# Patient Record
Sex: Female | Born: 1955
Health system: Southern US, Community
[De-identification: ages and names within clinical notes are randomized; demographics above are authoritative.]

## PROBLEM LIST (undated history)

## (undated) DIAGNOSIS — F419 Anxiety disorder, unspecified: Secondary | ICD-10-CM

## (undated) DIAGNOSIS — D509 Iron deficiency anemia, unspecified: Secondary | ICD-10-CM

## (undated) DIAGNOSIS — T7840XA Allergy, unspecified, initial encounter: Secondary | ICD-10-CM

## (undated) DIAGNOSIS — M069 Rheumatoid arthritis, unspecified: Secondary | ICD-10-CM

## (undated) DIAGNOSIS — J449 Chronic obstructive pulmonary disease, unspecified: Secondary | ICD-10-CM

## (undated) DIAGNOSIS — I209 Angina pectoris, unspecified: Secondary | ICD-10-CM

## (undated) DIAGNOSIS — Z9989 Dependence on other enabling machines and devices: Secondary | ICD-10-CM

## (undated) DIAGNOSIS — I219 Acute myocardial infarction, unspecified: Secondary | ICD-10-CM

## (undated) DIAGNOSIS — Z8719 Personal history of other diseases of the digestive system: Secondary | ICD-10-CM

## (undated) DIAGNOSIS — E119 Type 2 diabetes mellitus without complications: Secondary | ICD-10-CM

## (undated) DIAGNOSIS — F32A Depression, unspecified: Secondary | ICD-10-CM

## (undated) DIAGNOSIS — I4719 Other supraventricular tachycardia: Secondary | ICD-10-CM

## (undated) DIAGNOSIS — I5189 Other ill-defined heart diseases: Secondary | ICD-10-CM

## (undated) DIAGNOSIS — R011 Cardiac murmur, unspecified: Secondary | ICD-10-CM

## (undated) DIAGNOSIS — R519 Headache, unspecified: Secondary | ICD-10-CM

## (undated) DIAGNOSIS — I639 Cerebral infarction, unspecified: Secondary | ICD-10-CM

## (undated) DIAGNOSIS — E785 Hyperlipidemia, unspecified: Secondary | ICD-10-CM

## (undated) DIAGNOSIS — Z8711 Personal history of peptic ulcer disease: Secondary | ICD-10-CM

## (undated) DIAGNOSIS — I491 Atrial premature depolarization: Secondary | ICD-10-CM

## (undated) DIAGNOSIS — M1990 Unspecified osteoarthritis, unspecified site: Secondary | ICD-10-CM

## (undated) DIAGNOSIS — D689 Coagulation defect, unspecified: Secondary | ICD-10-CM

## (undated) DIAGNOSIS — I2510 Atherosclerotic heart disease of native coronary artery without angina pectoris: Secondary | ICD-10-CM

## (undated) DIAGNOSIS — F329 Major depressive disorder, single episode, unspecified: Secondary | ICD-10-CM

## (undated) DIAGNOSIS — H5789 Other specified disorders of eye and adnexa: Secondary | ICD-10-CM

## (undated) DIAGNOSIS — R0902 Hypoxemia: Secondary | ICD-10-CM

## (undated) DIAGNOSIS — I471 Supraventricular tachycardia: Secondary | ICD-10-CM

## (undated) DIAGNOSIS — R55 Syncope and collapse: Secondary | ICD-10-CM

## (undated) DIAGNOSIS — D569 Thalassemia, unspecified: Secondary | ICD-10-CM

## (undated) DIAGNOSIS — I509 Heart failure, unspecified: Secondary | ICD-10-CM

## (undated) DIAGNOSIS — K219 Gastro-esophageal reflux disease without esophagitis: Secondary | ICD-10-CM

## (undated) DIAGNOSIS — I201 Angina pectoris with documented spasm: Secondary | ICD-10-CM

## (undated) DIAGNOSIS — R51 Headache: Secondary | ICD-10-CM

## (undated) DIAGNOSIS — G4733 Obstructive sleep apnea (adult) (pediatric): Secondary | ICD-10-CM

## (undated) DIAGNOSIS — K259 Gastric ulcer, unspecified as acute or chronic, without hemorrhage or perforation: Secondary | ICD-10-CM

## (undated) DIAGNOSIS — I10 Essential (primary) hypertension: Secondary | ICD-10-CM

## (undated) DIAGNOSIS — G4730 Sleep apnea, unspecified: Secondary | ICD-10-CM

## (undated) DIAGNOSIS — J189 Pneumonia, unspecified organism: Secondary | ICD-10-CM

## (undated) DIAGNOSIS — Z9289 Personal history of other medical treatment: Secondary | ICD-10-CM

## (undated) DIAGNOSIS — I050 Rheumatic mitral stenosis: Secondary | ICD-10-CM

## (undated) DIAGNOSIS — I493 Ventricular premature depolarization: Secondary | ICD-10-CM

## (undated) DIAGNOSIS — D571 Sickle-cell disease without crisis: Secondary | ICD-10-CM

## (undated) HISTORY — DX: Chronic obstructive pulmonary disease, unspecified: J44.9

## (undated) HISTORY — DX: Thalassemia, unspecified: D56.9

## (undated) HISTORY — DX: Hyperlipidemia, unspecified: E78.5

## (undated) HISTORY — DX: Other ill-defined heart diseases: I51.89

## (undated) HISTORY — PX: BREAST BIOPSY: SHX20

## (undated) HISTORY — PX: VAGINAL HYSTERECTOMY: SUR661

## (undated) HISTORY — DX: Sickle-cell disease without crisis: D57.1

## (undated) HISTORY — DX: Coagulation defect, unspecified: D68.9

## (undated) HISTORY — PX: TONSILLECTOMY: SUR1361

## (undated) HISTORY — DX: Cerebral infarction, unspecified: I63.9

## (undated) HISTORY — DX: Gastro-esophageal reflux disease without esophagitis: K21.9

## (undated) HISTORY — PX: COLONOSCOPY: SHX174

## (undated) HISTORY — DX: Atherosclerotic heart disease of native coronary artery without angina pectoris: I25.10

## (undated) HISTORY — PX: DILATION AND CURETTAGE OF UTERUS: SHX78

## (undated) HISTORY — PX: JOINT REPLACEMENT: SHX530

## (undated) HISTORY — DX: Gastric ulcer, unspecified as acute or chronic, without hemorrhage or perforation: K25.9

## (undated) HISTORY — DX: Unspecified osteoarthritis, unspecified site: M19.90

## (undated) HISTORY — DX: Heart failure, unspecified: I50.9

## (undated) HISTORY — PX: ECTOPIC PREGNANCY SURGERY: SHX613

## (undated) HISTORY — DX: Acute myocardial infarction, unspecified: I21.9

## (undated) HISTORY — DX: Iron deficiency anemia, unspecified: D50.9

## (undated) HISTORY — DX: Allergy, unspecified, initial encounter: T78.40XA

## (undated) HISTORY — DX: Hypoxemia: R09.02

## (undated) SURGERY — ESOPHAGOGASTRODUODENOSCOPY (EGD) WITH PROPOFOL
Anesthesia: Monitor Anesthesia Care | Laterality: Left

---

## 2006-06-03 ENCOUNTER — Encounter: Admission: RE | Admit: 2006-06-03 | Discharge: 2006-06-03 | Payer: Self-pay | Admitting: Occupational Medicine

## 2007-08-28 ENCOUNTER — Ambulatory Visit (HOSPITAL_COMMUNITY): Admission: RE | Admit: 2007-08-28 | Discharge: 2007-08-28 | Payer: Self-pay | Admitting: Internal Medicine

## 2008-08-18 ENCOUNTER — Observation Stay (HOSPITAL_COMMUNITY): Admission: EM | Admit: 2008-08-18 | Discharge: 2008-08-19 | Payer: Self-pay | Admitting: Emergency Medicine

## 2010-11-05 ENCOUNTER — Encounter: Payer: Self-pay | Admitting: Gastroenterology

## 2010-12-21 ENCOUNTER — Other Ambulatory Visit: Payer: Self-pay | Admitting: Family Medicine

## 2010-12-21 DIAGNOSIS — Z1231 Encounter for screening mammogram for malignant neoplasm of breast: Secondary | ICD-10-CM

## 2010-12-29 ENCOUNTER — Ambulatory Visit
Admission: RE | Admit: 2010-12-29 | Discharge: 2010-12-29 | Disposition: A | Discharge status: Home / Self Care | Payer: BC Managed Care – PPO | Source: Ambulatory Visit | Attending: Family Medicine | Admitting: Family Medicine

## 2010-12-29 DIAGNOSIS — Z1231 Encounter for screening mammogram for malignant neoplasm of breast: Secondary | ICD-10-CM

## 2011-02-27 NOTE — Cardiovascular Report (Signed)
[name], [name]:  [id number]   MEDICAL RECORD NO.:  [id number]          PATIENT TYPE:  INP   LOCATION:  2036                         FACILITY:  [facility]   PHYSICIAN:  Madaline Savage, M.D.DATE OF BIRTH:  Mar 25, 1956   DATE OF PROCEDURE:  08/18/2008  DATE OF DISCHARGE:                            CARDIAC CATHETERIZATION   PROCEDURES PERFORMED:  1. Selective coronary angiography by Judkins technique.  2. Retrograde left heart catheterization.  3. Left ventricular angiography.   COMPLICATIONS:  None.   ENTRY SITE:  Right femoral.   DYE USED:  Omnipaque.   PATIENT PROFILE:  The patient is a 55 year old married African American  female who has presented to the emergency room with chest pain of 2-3  duration.  The patient's risk factors include obesity, tobacco use for  many years, hypertension, esophageal reflux disorder, and tobacco use.  She had a recent stress test showing a suspicious area of inferior wall  reversible perfusion abnormalities, and because of her presentation  today to the emergency room, we decided to bring her to the cath lab for  cardiac catheterization.  That procedure was performed electively by the  right percutaneous femoral approach using 5-French catheters without  complications.  No intervention was required.  The patient will be  watched overnight because of the lateness of today's catheterization,  and she will be a candidate for discharge tomorrow morning.   RESULTS:  Pressures:  The left ventricular pressure showed no aortic  valve gradient.  The pressures recorded in the catheterization report  are nonsimultaneous pressures.  There was no gradient across the aortic  valve by true catheter pullback.  LV pressure was recorded at 140/16.  Central aortic pressure was recorded at 130/76 with a mean of 96.   ANGIOGRAPHIC RESULTS:  The patient had very, very fine faint  calcifications in the proximal and mid LAD, no  pericardial valvular or  other calcifications were seen.   The left main coronary artery was large in diameter, fairly short in  length, and was normal.  LAD coursed to the cardiac apex giving rise to  3 septal perforator branches, 2 diagonal branches, and no lesions were  noted in the entirety of the LAD coronary system.   The circumflex was a large vessel, though nondominant.  It gave rise to  1 large obtuse marginal branch that had tortuous runoff and bifurcated  distally.  The remainder of the circumflex itself, which had 4 small  branches showed no evidence of any stenoses.   Right coronary artery showed a normal right coronary artery with a  posterior descending branch of moderate size bifurcating distally and 1  area of luminal irregularity in the midportion of the RCA that was no  more than 20% narrow.  It was smooth and not a high-grade or flow-  limiting lesion.   Left ventricular angiogram showed vigorous contractility of all wall  segments.  No normal left ventricular ejection fraction of 60%.  No  mitral regurgitation.   FINAL IMPRESSIONS:  1. Recent abnormal stress test followed by several-day history of      chest  pain presenting to the ER.  2. Cardiac catheterization showing following results, angiographically      patent coronary arteries with a right coronary dominant system.  3. Normal left ventricular systolic function.  4. No wall motion abnormalities of the left ventricle.   PLAN:  The patient will be a candidate for discharge tomorrow morning.  Due to the lateness of the hour at this evening, she will not be  discharged tonight.  I am concerned about hemostasis, her obesity, and  the lateness of the hour.   FINAL IMPRESSIONS:  Angiographically patent coronary arteries with  normal left ventricular systolic function.           ______________________________  Madaline Savage, M.D.     WHG/MEDQ  D:  08/18/2008  T:  08/19/2008  Job:  [id number]   cc:    [facility]

## 2011-02-27 NOTE — H&P (Signed)
NAME:  Lynn [name], Lynn [name]:  [id number]   MEDICAL RECORD NO.:  [id number]          PATIENT TYPE:  EMS   LOCATION:  MAJO                         FACILITY:  [facility]   PHYSICIAN:  Lynn [name], M.D.DATE OF BIRTH:  May 01, 1956   DATE OF ADMISSION:  08/18/2008  DATE OF DISCHARGE:                              HISTORY & PHYSICAL   CHIEF COMPLAINT:  Chest pain and shortness of breath.   HISTORY OF PRESENT ILLNESS:  Lynn [name] is a pleasant 55 year old  female who was seen by Lynn [name] June 03, 2008 in our office.  Please refer to his complete note for details.  She had been referred to  him from [facility] for chest pain.  She had actually been  having symptoms off and on for a couple months.  She does have risk  factors for coronary disease.  Lynn [name] had set her up for an  outpatient Myoview which was done August 10, 2008.  This revealed an EF  of 78%.  There were no significant wall motion abnormalities.  There was  a suggestion of mild ischemia in the LAD distribution.  She was  scheduled to follow up with Lynn [name] August 24, 2008.  In the  interim she has continued to have intermittent chest pain off and on.  She said last night at rest she had midsternal chest pain which radiated  to her left axilla area.  This was associated with some shortness of  breath and she said she broke out into a sweat and got nauseated.  She  took some aspirin at home and her symptoms eventually subsided, although  they did not abate completely.  This morning she felt better and was  doing well until she got excited after she opened up something in the  mail and then had chest pain, shortness of breath and sweating again.  She is now seen in the emergency room.  She presented initially to  [facility]. was given nitroglycerin there and she thinks she has  had some relief from this, but she still has some vague discomfort.  Her  EKG in the emergency  room shows sinus rhythm without acute changes.  She  is admitted now for further evaluation.   PAST MEDICAL HISTORY:  Remarkable for treated hypertension.  She had a  hysterectomy in the past and remote tonsillectomy and right knee  arthroscopic surgery in the past.   Her medications at home are:  1. Diovan 80 mg daily.  2. Aspirin 81 mg daily.   ALLERGIES:  Include PENICILLIN WHICH CAUSED A RASH AND ACE INHIBITORS  WHICH CAUSE A COUGH.  SHE GETS DISCOMFORT IN HER LEGS FROM  HYDROCHLOROTHIAZIDE.   SOCIAL HISTORY:  The patient has one child and two grandchildren.  She  works as a Midwife.  She has a 40 pack-year history.   FAMILY HISTORY:  Is remarkable for hypertension.   REVIEW OF SYSTEMS:  Essentially unremarkable except for noted above.  She denies any GI bleeding or melena, recent fever or chills.  She has  not had thyroid disease that  she knows of.   PHYSICAL EXAM:  Blood pressure 156/96, pulse 96, temperature 97, O2 sat  is 100% on 2 liters.  IN GENERAL:  She is a well-developed overweight African American female  in no acute distress.  HEENT:  Normocephalic.  Extraocular movements are intact.  Sclerae is  nonicteric.  Lids and conjunctivae are within normal limits.  NECK:  Without JVD or bruit.  Thyroid is not enlarged.  CHEST:  Clear to auscultation and percussion.  CARDIAC:  Exam reveals regular rate and rhythm without murmur, rub or  gallop.  Normal S1, S2.  ABDOMEN:  Is obese, nontender.  EXTREMITIES: Without edema.  Distal pulses are 3+/4 bilaterally.  NEURO:  Exam grossly intact.  She is awake, alert and oriented,  cooperative.  Moves all extremities without obvious deficit.  SKIN:  Is warm and dry.   EKG shows sinus rhythm with poor anterior R-wave progression but no  acute changes.   IMPRESSION:  1. Unstable angina.  2. Abnormal outpatient Myoview study.  3. Treated hypertension.  4. History of smoking.  5. Obesity.   PLAN:  The patient  will be  seen by Dr. Elsie [name] in the emergency room.  We will go ahead and start her on heparin and nitroglycerin.  She will  need diagnostic catheterization.      Lynn [name], P.A.    ______________________________  Lynn [name], M.D.    Lynn [name]  D:  08/18/2008  T:  08/18/2008  Job:  [id number]

## 2011-03-02 NOTE — Discharge Summary (Signed)
Lynn [name], HANGER NO.:  [id number]   MEDICAL RECORD NO.:  [id number]          PATIENT TYPE:  INP   LOCATION:  2036                         FACILITY:  [facility]   PHYSICIAN:  Madaline Savage, M.D.DATE OF BIRTH:  1956/06/12   DATE OF ADMISSION:  08/18/2008  DATE OF DISCHARGE:  08/19/2008                               DISCHARGE SUMMARY   DISCHARGE DIAGNOSES:  1. Chest pain, worrisome for unstable angina.  2. Hypertension.  3. History of smoking.  4. Obesity.   HOSPITAL COURSE:  The patient is a 55 year old female who was actually  seen by Dr. [name] in our office on June 11, 2008.  She had been  referred to him for chest pain from [name].  She does have  risk factors for coronary artery disease.  She did have a Myoview, which  was done on August 10, 2008, that revealed good LV function with no  significant wall motion abnormalities and a suggestion of mild ischemia  in the LAD distribution.  She was scheduled to follow up with Dr.  [name], but presented in the interim to [facility] with substernal chest  pain and shortness of breath.  See admission history and physical for  complete details.  Her symptoms were worrisome for unstable angina.  She  was admitted to the hospital and started on IV heparin and nitrates.  Diagnostic catheterization was done on August 18, 2008, by Dr. Elsie Lincoln  which revealed normal coronaries and normal LV function.  She tolerated  the procedure well.  We feel she could be discharged on August 19, 2008, she was seen by Dr. [name] at discharge.  He suggested possibly a  GI evaluation and EGD as an outpatient.  She will follow up with Dr.  [name] prior to going back to work.   DISCHARGE MEDICATIONS:  1. Diovan 80 mg a day.  2. Aspirin 81 mg a day.  3. Nexium 40 mg a day.   EKG shows sinus rhythm without acute changes.   LABORATORY DATA:  White count 8.3, hemoglobin at discharge 9.4,  hematocrit 29.4, and platelets  256.  INR 0.9.  Sodium 139, potassium  4.4, BUN 10, and creatinine 0.8.  CK was elevated at 185, but MBs and  troponins were  negative x3.  LFTs were normal.  Lipid panel reveals a cholesterol 160,  triglycerides 79, HDL 30, and LDL 114.  TSH 1.68.   DISPOSITION:  The patient is discharged in stable condition and will  follow up with Dr. [name] as an outpatient.      [name], P.A.    ______________________________  Madaline Savage, M.D.    Lenard Lance  D:  09/30/2008  T:  09/30/2008  Job:  [id number]   cc:   [facility]  Sheliah Mends, MD

## 2011-07-17 LAB — COMPREHENSIVE METABOLIC PANEL
ALT: 16
AST: 18
Albumin: 3.6
Alkaline Phosphatase: 93
BUN: 11
CO2: 29
Calcium: 9.1
Chloride: 107
Creatinine, Ser: 0.79
GFR calc Af Amer: >60
GFR calc non Af Amer: >60
Glucose, Bld: 100 — ABNORMAL HIGH
Potassium: 3.9
Sodium: 139
Total Bilirubin: 0.3
Total Protein: 7.1

## 2011-07-17 LAB — BASIC METABOLIC PANEL
BUN: 10
BUN: 12
CO2: 29
CO2: 31
Calcium: 8.9
Calcium: 9.3
Chloride: 104
Chloride: 108
Creatinine, Ser: 0.78
Creatinine, Ser: 0.86
GFR calc Af Amer: >60
GFR calc Af Amer: >60
GFR calc non Af Amer: >60
GFR calc non Af Amer: >60
Glucose, Bld: 102 — ABNORMAL HIGH
Glucose, Bld: 107 — ABNORMAL HIGH
Potassium: 4.1
Potassium: 4.4
Sodium: 139
Sodium: 140

## 2011-07-17 LAB — DIFFERENTIAL
Basophils Absolute: 0.1
Basophils Relative: 1
Eosinophils Absolute: 0.2
Eosinophils Relative: 2
Lymphocytes Relative: 26
Lymphs Abs: 2.4
Monocytes Absolute: 0.9
Monocytes Relative: 10
Neutro Abs: 5.7
Neutrophils Relative %: 61

## 2011-07-17 LAB — CBC
HCT: 29.4 — ABNORMAL LOW
HCT: 31.1 — ABNORMAL LOW
Hemoglobin: 10 — ABNORMAL LOW
Hemoglobin: 9.4 — ABNORMAL LOW
MCHC: 31.9
MCHC: 32
MCV: 60.7 — ABNORMAL LOW
MCV: 60.8 — ABNORMAL LOW
Platelets: 256
Platelets: 276
RBC: 4.84
RBC: 5.11
RDW: 16.4 — ABNORMAL HIGH
RDW: 16.6 — ABNORMAL HIGH
WBC: 8.3
WBC: 9.3

## 2011-07-17 LAB — CARDIAC PANEL(CRET KIN+CKTOT+MB+TROPI)
CK, MB: 1.9
Relative Index: 1
Total CK: 184 — ABNORMAL HIGH
Troponin I: <0.01

## 2011-07-17 LAB — LIPID PANEL
Cholesterol: 160
HDL: 30 — ABNORMAL LOW
LDL Cholesterol: 114 — ABNORMAL HIGH
Total CHOL/HDL Ratio: 5.3
Triglycerides: 79
VLDL: 16

## 2011-07-17 LAB — CK TOTAL AND CKMB (NOT AT ARMC)
CK, MB: 1.9
CK, MB: 2.3
Relative Index: 1.2
Relative Index: 1.2
Total CK: 153
Total CK: 185 — ABNORMAL HIGH

## 2011-07-17 LAB — URINALYSIS, ROUTINE W REFLEX MICROSCOPIC
Bilirubin Urine: NEGATIVE
Glucose, UA: NEGATIVE
Hgb urine dipstick: NEGATIVE
Ketones, ur: NEGATIVE
Nitrite: NEGATIVE
Protein, ur: NEGATIVE
Specific Gravity, Urine: 1.021
Urobilinogen, UA: 0.2
pH: 5.5

## 2011-07-17 LAB — APTT: aPTT: 38 — ABNORMAL HIGH

## 2011-07-17 LAB — TSH: TSH: 1.685

## 2011-07-17 LAB — POCT CARDIAC MARKERS
CKMB, poc: 1.8
Myoglobin, poc: 86.6
Troponin i, poc: <0.05

## 2011-07-17 LAB — TROPONIN I
Troponin I: 0.01
Troponin I: 0.01

## 2011-07-17 LAB — PROTIME-INR
INR: 0.9
Prothrombin Time: 12.6

## 2011-10-05 ENCOUNTER — Ambulatory Visit (INDEPENDENT_AMBULATORY_CARE_PROVIDER_SITE_OTHER): Payer: BC Managed Care – PPO | Admitting: Family Medicine

## 2011-10-05 DIAGNOSIS — IMO0001 Reserved for inherently not codable concepts without codable children: Secondary | ICD-10-CM

## 2011-10-05 DIAGNOSIS — I10 Essential (primary) hypertension: Secondary | ICD-10-CM

## 2011-10-05 DIAGNOSIS — J209 Acute bronchitis, unspecified: Secondary | ICD-10-CM

## 2011-10-13 ENCOUNTER — Ambulatory Visit (INDEPENDENT_AMBULATORY_CARE_PROVIDER_SITE_OTHER): Payer: BC Managed Care – PPO

## 2011-10-13 DIAGNOSIS — J189 Pneumonia, unspecified organism: Secondary | ICD-10-CM

## 2011-10-13 DIAGNOSIS — R059 Cough, unspecified: Secondary | ICD-10-CM

## 2011-10-13 DIAGNOSIS — R05 Cough: Secondary | ICD-10-CM

## 2012-07-22 ENCOUNTER — Ambulatory Visit (INDEPENDENT_AMBULATORY_CARE_PROVIDER_SITE_OTHER): Payer: BC Managed Care – PPO | Admitting: Family Medicine

## 2012-07-22 VITALS — BP 106/68 | HR 81 | Temp 97.9°F | Resp 18 | Ht 63.0 in | Wt 236.0 lb

## 2012-07-22 DIAGNOSIS — E1149 Type 2 diabetes mellitus with other diabetic neurological complication: Secondary | ICD-10-CM

## 2012-07-22 DIAGNOSIS — E119 Type 2 diabetes mellitus without complications: Secondary | ICD-10-CM

## 2012-07-22 DIAGNOSIS — I10 Essential (primary) hypertension: Secondary | ICD-10-CM

## 2012-07-22 DIAGNOSIS — E1140 Type 2 diabetes mellitus with diabetic neuropathy, unspecified: Secondary | ICD-10-CM

## 2012-07-22 DIAGNOSIS — Z23 Encounter for immunization: Secondary | ICD-10-CM

## 2012-07-22 LAB — LIPID PANEL
Cholesterol: 183 mg/dL (ref 0–200)
HDL: 40 mg/dL (ref >39)
LDL Cholesterol: 116 mg/dL — ABNORMAL HIGH (ref 0–99)
Total CHOL/HDL Ratio: 4.6 Ratio
Triglycerides: 134 mg/dL (ref <150)
VLDL: 27 mg/dL (ref 0–40)

## 2012-07-22 LAB — COMPREHENSIVE METABOLIC PANEL
ALT: 22 U/L (ref 0–35)
AST: 18 U/L (ref 0–37)
Albumin: 4.3 g/dL (ref 3.5–5.2)
Alkaline Phosphatase: 89 U/L (ref 39–117)
BUN: 13 mg/dL (ref 6–23)
CO2: 28 mEq/L (ref 19–32)
Calcium: 9.6 mg/dL (ref 8.4–10.5)
Chloride: 104 mEq/L (ref 96–112)
Creat: 0.86 mg/dL (ref 0.50–1.10)
Glucose, Bld: 125 mg/dL — ABNORMAL HIGH (ref 70–99)
Potassium: 4 mEq/L (ref 3.5–5.3)
Sodium: 139 mEq/L (ref 135–145)
Total Bilirubin: 0.3 mg/dL (ref 0.3–1.2)
Total Protein: 7.8 g/dL (ref 6.0–8.3)

## 2012-07-22 LAB — TSH: TSH: 1.655 u[IU]/mL (ref 0.350–4.500)

## 2012-07-22 LAB — POCT GLYCOSYLATED HEMOGLOBIN (HGB A1C): Hemoglobin A1C: 7.2

## 2012-07-22 MED ORDER — LOSARTAN POTASSIUM-HCTZ 100-12.5 MG PO TABS: 1.0000 | ORAL_TABLET | Freq: Every day | ORAL | Status: DC

## 2012-07-22 MED ORDER — METFORMIN HCL 1000 MG PO TABS: 1000.0000 mg | ORAL_TABLET | Freq: Every day | ORAL | Status: DC

## 2012-07-22 MED ORDER — METFORMIN HCL 500 MG PO TABS: 500.0000 mg | ORAL_TABLET | Freq: Every day | ORAL | Status: DC

## 2012-07-22 NOTE — Progress Notes (Signed)
Faxed 90 day meds to pharm # in Demographics- I also included Pt's ID # on the Rx's. Lynn [name]

## 2012-07-22 NOTE — Progress Notes (Signed)
  Subjective:    Patient ID: Lynn [name], female    DOB: 1956-06-19, 56 y.o.   MRN: [id number]  HPI  BP at home 130s-140s/70s-80s.  Is having some lower ext edema and occ tingling in feet and stabbing pain in big toes.  Burning and tingling is becoming more freq and is a daily occurence. Taking metformoin 1000mg  qhs.  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  Rarely checks sugars and now meter is broken.  Last eye exam about 2 yrs prev.  Has been trying and has successfully lost a few lbs recently.     Review of Systems  Constitutional: Negative for fever, chills, activity change, appetite change and unexpected weight change.  Eyes: Negative for visual disturbance.  Respiratory: Negative for chest tightness and shortness of breath.   Cardiovascular: Positive for leg swelling. Negative for chest pain and palpitations.  Gastrointestinal: Positive for diarrhea. Negative for nausea, vomiting, constipation and abdominal distention.  Musculoskeletal: Positive for joint swelling and arthralgias. Negative for gait problem.  Skin: Negative for color change and rash.  Neurological: Negative for weakness and numbness.      No past medical history on file. BP 106/68  Pulse 81  Temp 97.9 F (36.6 C) (Oral)  Resp 18  Ht 5\' 3"  (1.6 m)  Wt 236 lb (107.049 kg)  BMI 41.81 kg/m2  SpO2 99%  Objective:   Physical Exam  Constitutional: She is oriented to person, place, and time. She appears well-developed and well-nourished. No distress.  HENT:  Head: Normocephalic and atraumatic.  Right Ear: External ear normal.  Left Ear: External ear normal.  Eyes: Conjunctivae normal are normal. No scleral icterus.  Neck: Normal range of motion. Neck supple. No thyromegaly present.  Cardiovascular: Normal rate, regular rhythm, normal heart sounds and intact distal pulses.   Pulmonary/Chest: Effort normal and breath sounds normal. No respiratory  distress.  Musculoskeletal: She exhibits no edema.  Lymphadenopathy:    She has no cervical adenopathy.  Neurological: She is alert and oriented to person, place, and time. She has normal strength. No sensory deficit. Gait normal.       Monofilament nml bilaterally  Skin: Skin is warm, dry and intact. She is not diaphoretic.       Feet nml  Psychiatric: She has a normal mood and affect. Her behavior is normal.      Results for orders placed in visit on 07/22/12  POCT GLYCOSYLATED HEMOGLOBIN (HGB A1C)      Component Value Range   Hemoglobin A1C 7.2      Assessment & Plan:  1. DM - close to goal. Cont metformin 1000qhs and see if she can tolerate 500 or 250 qam.  If she can't, consider trying the ER version or consider adding in [place] or [place] instead to cont to help w weight loss.  F/u w/ optho for Dm eye exam - pt will make an appt. Check lipids and microalb.  On asa? Gave rx for new meter, strips, lancets.  2. HTN - at goal. Bring cuff to f/u for comparison. 3. Neuropathy - likely from DM but check tsh, cons cbc w/ vit b12 and rpr at f/u. Consider gabapentin

## 2012-07-23 LAB — MICROALBUMIN, URINE: Microalb, Ur: 0.5 mg/dL (ref 0.00–1.89)

## 2012-08-24 ENCOUNTER — Other Ambulatory Visit: Payer: Self-pay | Admitting: *Deleted

## 2012-08-24 ENCOUNTER — Other Ambulatory Visit: Payer: Self-pay | Admitting: Family Medicine

## 2012-08-24 DIAGNOSIS — E1169 Type 2 diabetes mellitus with other specified complication: Secondary | ICD-10-CM | POA: Insufficient documentation

## 2012-08-24 DIAGNOSIS — E785 Hyperlipidemia, unspecified: Secondary | ICD-10-CM

## 2012-08-24 MED ORDER — PRAVASTATIN SODIUM 20 MG PO TABS: 20.0000 mg | ORAL_TABLET | Freq: Every day | ORAL | Status: DC

## 2012-08-24 NOTE — Progress Notes (Signed)
Done

## 2012-08-25 MED ORDER — PRAVASTATIN SODIUM 20 MG PO TABS: 20.0000 mg | ORAL_TABLET | Freq: Every day | ORAL | Status: DC

## 2012-08-25 NOTE — Addendum Note (Signed)
Addended byCaffie Damme on: 08/25/2012 01:45 PM   Modules accepted: Orders

## 2013-01-12 ENCOUNTER — Other Ambulatory Visit: Payer: Self-pay

## 2013-01-12 DIAGNOSIS — Z1231 Encounter for screening mammogram for malignant neoplasm of breast: Secondary | ICD-10-CM

## 2013-01-13 ENCOUNTER — Ambulatory Visit
Admission: RE | Admit: 2013-01-13 | Discharge: 2013-01-13 | Disposition: A | Discharge status: Home / Self Care | Payer: BC Managed Care – PPO | Source: Ambulatory Visit

## 2013-01-13 DIAGNOSIS — Z1231 Encounter for screening mammogram for malignant neoplasm of breast: Secondary | ICD-10-CM

## 2013-09-02 ENCOUNTER — Encounter: Payer: Self-pay | Admitting: Family Medicine

## 2013-09-02 ENCOUNTER — Ambulatory Visit (INDEPENDENT_AMBULATORY_CARE_PROVIDER_SITE_OTHER): Payer: BC Managed Care – PPO | Admitting: Family Medicine

## 2013-09-02 VITALS — BP 134/78 | HR 97 | Temp 98.2°F | Resp 18 | Ht 63.5 in | Wt 230.8 lb

## 2013-09-02 DIAGNOSIS — M7712 Lateral epicondylitis, left elbow: Secondary | ICD-10-CM

## 2013-09-02 DIAGNOSIS — M7710 Lateral epicondylitis, unspecified elbow: Secondary | ICD-10-CM

## 2013-09-02 DIAGNOSIS — D569 Thalassemia, unspecified: Secondary | ICD-10-CM | POA: Insufficient documentation

## 2013-09-02 DIAGNOSIS — E7800 Pure hypercholesterolemia, unspecified: Secondary | ICD-10-CM

## 2013-09-02 DIAGNOSIS — I10 Essential (primary) hypertension: Secondary | ICD-10-CM

## 2013-09-02 DIAGNOSIS — E119 Type 2 diabetes mellitus without complications: Secondary | ICD-10-CM

## 2013-09-02 LAB — POCT CBC
Granulocyte percent: 59.7 %G (ref 37–80)
HCT, POC: 35.1 % — AB (ref 37.7–47.9)
Hemoglobin: 10.4 g/dL — AB (ref 12.2–16.2)
Lymph, poc: 3 (ref 0.6–3.4)
MCH, POC: 19.2 pg — AB (ref 27–31.2)
MCHC: 29.6 g/dL — AB (ref 31.8–35.4)
MCV: 64.8 fL — AB (ref 80–97)
MID (cbc): 0.6 (ref 0–0.9)
MPV: 9.7 fL (ref 0–99.8)
POC Granulocyte: 5.3 (ref 2–6.9)
POC LYMPH PERCENT: 33.6 %L (ref 10–50)
POC MID %: 6.7 %M (ref 0–12)
Platelet Count, POC: 332 10*3/uL (ref 142–424)
RBC: 5.42 M/uL (ref 4.04–5.48)
RDW, POC: 15.8 %
WBC: 8.9 10*3/uL (ref 4.6–10.2)

## 2013-09-02 LAB — LDL CHOLESTEROL, DIRECT: Direct LDL: 131 mg/dL — ABNORMAL HIGH

## 2013-09-02 LAB — COMPREHENSIVE METABOLIC PANEL
ALT: 22 U/L (ref 0–35)
AST: 15 U/L (ref 0–37)
Albumin: 4.4 g/dL (ref 3.5–5.2)
Alkaline Phosphatase: 101 U/L (ref 39–117)
BUN: 14 mg/dL (ref 6–23)
CO2: 27 mEq/L (ref 19–32)
Calcium: 10.2 mg/dL (ref 8.4–10.5)
Chloride: 101 mEq/L (ref 96–112)
Creat: 0.94 mg/dL (ref 0.50–1.10)
Glucose, Bld: 209 mg/dL — ABNORMAL HIGH (ref 70–99)
Potassium: 4 mEq/L (ref 3.5–5.3)
Sodium: 136 mEq/L (ref 135–145)
Total Bilirubin: 0.3 mg/dL (ref 0.3–1.2)
Total Protein: 7.4 g/dL (ref 6.0–8.3)

## 2013-09-02 LAB — MICROALBUMIN, URINE: Microalb, Ur: 0.5 mg/dL (ref 0.00–1.89)

## 2013-09-02 LAB — POCT GLYCOSYLATED HEMOGLOBIN (HGB A1C): Hemoglobin A1C: 7.9

## 2013-09-02 MED ORDER — LOSARTAN POTASSIUM-HCTZ 100-12.5 MG PO TABS: 1.0000 | ORAL_TABLET | Freq: Every day | ORAL | Status: DC

## 2013-09-02 MED ORDER — PRAVASTATIN SODIUM 20 MG PO TABS: 20.0000 mg | ORAL_TABLET | Freq: Every day | ORAL | Status: DC

## 2013-09-02 MED ORDER — METFORMIN HCL 1000 MG PO TABS: 1000.0000 mg | ORAL_TABLET | Freq: Two times a day (BID) | ORAL | Status: DC

## 2013-09-02 NOTE — Patient Instructions (Signed)
For more information about the bariatric seminar at [facility]:  [email or web address]  I will be in touch with the rest of your labs.  We will refer you to sports medicine for your wrist and elbow pains

## 2013-09-02 NOTE — Progress Notes (Addendum)
[facility] [address], [place] [address] [phone]  Date:  09/02/2013   Name:  Lynn [name]   DOB:  [birth year]   MRN:  [id number]  PCP:  Abbe Amsterdam, MD    Chief Complaint: Medication Refill, Wrist pain, Elbow Pain and Dizziness   History of Present Illness:  Lynn [name] is a 57 y.o. very pleasant female patient who presents with the following:  History of DM, HTN, obesity, high cholesterol. History of OSA- on Cpap.  Last seen here over one year ago.    She needs follow-up of her DM, HTN, etc.   She is not fasting today; she had coffee and a muffin.   She does check her glucose at home and has been down to 120- highest 150 or so.   Her BP has been good at home.    2 weeks ago she noted that when she would roll over to the right in her bed she had vertigo and nystagmus.  She may feel a little wobbily when she stands up. If she holds her head still the sx will resolve in a few seconds.   She heard tinnitus for about one minute yesterday, no vision changes.  No HA She has not had this in the past. No fall, no syncope, no LOC  She also notes right wrist pain for the last couple of weeks.  The pain runs down the lateral wrist and into her ring and small finger.   She notes a similar pain in the left elbow running down to the wrist for 6- 12 months.   She has tried a wrist splint on the right which does help some.   Typing and playing her guitar/ drums can increase her pain.    She drives a transit bus for work.  She uses her right hand a lot.   She had a mammogram on her birthday, colonoscopy 5 years ago  Wt Readings from Last 3 Encounters:  09/02/13 230 lb 12.8 oz (104.69 kg)  07/22/12 236 lb (107.049 kg)     Patient Active Problem List   Diagnosis Date Noted  . Hyperlipidemia LDL goal < 100 08/24/2012  . Diabetes mellitus 07/22/2012  . Hypertension 07/22/2012    Past Medical History  Diagnosis Date  . Anemia   . Arthritis    . Diabetes mellitus without complication   . Ulcer     History reviewed. No pertinent past surgical history.  History  Substance Use Topics  . Smoking status: Current Every Day Smoker  . Smokeless tobacco: Not on file  . Alcohol Use: No    Family History  Problem Relation Age of Onset  . Cancer Mother   . Cancer Father   . Mental illness Sister   . Diabetes Maternal Grandmother   . Diabetes Maternal Grandfather     Allergies  Allergen Reactions  . Penicillins Hives    Medication list has been reviewed and updated.  Current Outpatient Prescriptions on File Prior to Visit  Medication Sig Dispense Refill  . losartan-hydrochlorothiazide (HYZAAR) 100-12.5 MG per tablet Take 1 tablet by mouth daily.  30 tablet  0  . metFORMIN (GLUCOPHAGE) 1000 MG tablet Take 1 tablet (1,000 mg total) by mouth daily with supper.  90 tablet  3  . metFORMIN (GLUCOPHAGE) 500 MG tablet Take 1 tablet (500 mg total) by mouth daily with breakfast.  90 tablet  1  . pravastatin (PRAVACHOL) 20 MG tablet Take 1 tablet (20 mg  total) by mouth daily.  90 tablet  1   No current facility-administered medications on file prior to visit.    Review of Systems:  As per HPI- otherwise negative.   Physical Examination: Filed Vitals:   09/02/13 0902  BP: 134/78  Pulse: 97  Temp: 98.2 F (36.8 C)  Resp: 18   Filed Vitals:   09/02/13 0902  Height: 5' 3.5" (1.613 m)  Weight: 230 lb 12.8 oz (104.69 kg)   Body mass index is 40.24 kg/(m^2). Ideal Body Weight: Weight in (lb) to have BMI = 25: 143.1  GEN: WDWN, NAD, Non-toxic, A & O x 3, obese, looks well HEENT: Atraumatic, Normocephalic. Neck supple. No masses, No LAD.  Bilateral TM wnl, oropharynx normal.  PEERL,EOMI.   Ears and Nose: No external deformity. CV: RRR, No M/G/R. No JVD. No thrill. No extra heart sounds. PULM: CTA B, no wheezes, crackles, rhonchi. No retractions. No resp. distress. No accessory muscle use. ABD: S, NT, ND, +BS. No rebound.  No HSM. EXTR: No c/c/e NEURO Normal gait.  PSYCH: Normally interactive. Conversant. Not depressed or anxious appearing.  Calm demeanor.  Feet: performed DM foot exam today.  Normal monofilament testing. No sores, NV intact bilaterally Both elbows, wrists, hands with full ROM.  She has discomfort with resisted pronation/ supination bilaterally but good strength.  Her pain seems to be more in the ulnar nerve distribution that the median nerve.  She is tender over the left lateral epicondyle   Assessment and Plan: Type II or unspecified type diabetes mellitus without mention of complication, not stated as uncontrolled - Plan: Microalbumin, urine, POCT glycosylated hemoglobin (Hb A1C), HM DIABETES FOOT EXAM, metFORMIN (GLUCOPHAGE) 1000 MG tablet, DISCONTINUED: metFORMIN (GLUCOPHAGE) 1000 MG tablet  Essential hypertension, benign - Plan: POCT CBC, Comprehensive metabolic panel, losartan-hydrochlorothiazide (HYZAAR) 100-12.5 MG per tablet, losartan-hydrochlorothiazide (HYZAAR) 100-12.5 MG per tablet, DISCONTINUED: losartan-hydrochlorothiazide (HYZAAR) 100-12.5 MG per tablet, DISCONTINUED: losartan-hydrochlorothiazide (HYZAAR) 100-12.5 MG per tablet  High cholesterol - Plan: Comprehensive metabolic panel, LDL cholesterol, direct, pravastatin (PRAVACHOL) 20 MG tablet, DISCONTINUED: pravastatin (PRAVACHOL) 20 MG tablet  Lateral epicondylitis (tennis elbow), left - Plan: Ambulatory referral to Sports Medicine  Thalassemia  Anemia is at baseline; thalassemia.  She is due to follow-up her colonoscopy and plans to do so soon Dm: her A1c has come up a bit from last year.  Will discuss with her on follow-up.  She is aware that weight loss is important for her and plans to attend the [facility] bariatrics seminar  BP: well controlled, continue medications Check LDL direct today as she is not fasting Referral to sports med for bilateral wrist and elbow issues, likely due to overuse  Signed Abbe Amsterdam,  MD  Received her labs and gave her a call.  Her LDL is above goal, and her A1c is a bit higher than it had been.  Encouraged weight loss through diet and exercise, and recheck in 3 or 4 months.  She agrees with this plan Results for orders placed in visit on 09/02/13  MICROALBUMIN, URINE      Result Value Range   Microalb, Ur 0.50  0.00 - 1.89 mg/dL  COMPREHENSIVE METABOLIC PANEL      Result Value Range   Sodium 136  135 - 145 mEq/L   Potassium 4.0  3.5 - 5.3 mEq/L   Chloride 101  96 - 112 mEq/L   CO2 27  19 - 32 mEq/L   Glucose, Bld 209 (*) 70 - 99 mg/dL  BUN 14  6 - 23 mg/dL   Creat 1.61  0.96 - 0.45 mg/dL   Total Bilirubin 0.3  0.3 - 1.2 mg/dL   Alkaline Phosphatase 101  39 - 117 U/L   AST 15  0 - 37 U/L   ALT 22  0 - 35 U/L   Total Protein 7.4  6.0 - 8.3 g/dL   Albumin 4.4  3.5 - 5.2 g/dL   Calcium 40.9  8.4 - 81.1 mg/dL  LDL CHOLESTEROL, DIRECT      Result Value Range   Direct LDL 131 (*)   POCT CBC      Result Value Range   WBC 8.9  4.6 - 10.2 K/uL   Lymph, poc 3.0  0.6 - 3.4   POC LYMPH PERCENT 33.6  10 - 50 %L   MID (cbc) 0.6  0 - 0.9   POC MID % 6.7  0 - 12 %M   POC Granulocyte 5.3  2 - 6.9   Granulocyte percent 59.7  37 - 80 %G   RBC 5.42  4.04 - 5.48 M/uL   Hemoglobin 10.4 (*) 12.2 - 16.2 g/dL   HCT, POC 91.4 (*) 78.2 - 47.9 %   MCV 64.8 (*) 80 - 97 fL   MCH, POC 19.2 (*) 27 - 31.2 pg   MCHC 29.6 (*) 31.8 - 35.4 g/dL   RDW, POC 95.6     Platelet Count, POC 332  142 - 424 K/uL   MPV 9.7  0 - 99.8 fL  POCT GLYCOSYLATED HEMOGLOBIN (HGB A1C)      Result Value Range   Hemoglobin A1C 7.9

## 2013-09-03 ENCOUNTER — Other Ambulatory Visit: Payer: Self-pay

## 2013-09-03 DIAGNOSIS — E7800 Pure hypercholesterolemia, unspecified: Secondary | ICD-10-CM

## 2013-09-03 MED ORDER — PRAVASTATIN SODIUM 20 MG PO TABS: 20.0000 mg | ORAL_TABLET | Freq: Every day | ORAL | Status: DC

## 2013-09-29 ENCOUNTER — Ambulatory Visit (INDEPENDENT_AMBULATORY_CARE_PROVIDER_SITE_OTHER): Payer: BC Managed Care – PPO | Admitting: Physician Assistant

## 2013-09-29 ENCOUNTER — Ambulatory Visit: Payer: BC Managed Care – PPO

## 2013-09-29 VITALS — BP 152/80 | HR 96 | Temp 98.4°F | Resp 20 | Ht 62.0 in | Wt 232.0 lb

## 2013-09-29 DIAGNOSIS — R05 Cough: Secondary | ICD-10-CM

## 2013-09-29 DIAGNOSIS — R0602 Shortness of breath: Secondary | ICD-10-CM

## 2013-09-29 DIAGNOSIS — J189 Pneumonia, unspecified organism: Secondary | ICD-10-CM

## 2013-09-29 DIAGNOSIS — R059 Cough, unspecified: Secondary | ICD-10-CM

## 2013-09-29 DIAGNOSIS — D649 Anemia, unspecified: Secondary | ICD-10-CM

## 2013-09-29 DIAGNOSIS — R509 Fever, unspecified: Secondary | ICD-10-CM

## 2013-09-29 DIAGNOSIS — R062 Wheezing: Secondary | ICD-10-CM

## 2013-09-29 DIAGNOSIS — D7289 Other specified disorders of white blood cells: Secondary | ICD-10-CM

## 2013-09-29 LAB — POCT CBC
Granulocyte percent: 66.8 %G (ref 37–80)
HCT, POC: 33.2 % — AB (ref 37.7–47.9)
Hemoglobin: 9.9 g/dL — AB (ref 12.2–16.2)
Lymph, poc: 3.3 (ref 0.6–3.4)
MCH, POC: 19.4 pg — AB (ref 27–31.2)
MCHC: 29.8 g/dL — AB (ref 31.8–35.4)
MCV: 64.9 fL — AB (ref 80–97)
MID (cbc): 0.7 (ref 0–0.9)
MPV: 9.2 fL (ref 0–99.8)
POC Granulocyte: 7.9 — AB (ref 2–6.9)
POC LYMPH PERCENT: 27.7 %L (ref 10–50)
POC MID %: 5.5 %M (ref 0–12)
Platelet Count, POC: 323 10*3/uL (ref 142–424)
RBC: 5.11 M/uL (ref 4.04–5.48)
RDW, POC: 16 %
WBC: 11.9 10*3/uL — AB (ref 4.6–10.2)

## 2013-09-29 MED ORDER — LEVOFLOXACIN 500 MG PO TABS: 500.0000 mg | ORAL_TABLET | Freq: Every day | ORAL | Status: DC

## 2013-09-29 MED ORDER — HYDROCODONE-HOMATROPINE 5-1.5 MG/5ML PO SYRP: ORAL_SOLUTION | ORAL | Status: DC

## 2013-09-29 MED ORDER — BENZONATATE 200 MG PO CAPS: 200.0000 mg | ORAL_CAPSULE | Freq: Two times a day (BID) | ORAL | Status: DC | PRN

## 2013-09-29 NOTE — Progress Notes (Signed)
Patient ID: Lynn [name] MRN: [id number], DOB: 10/26/1955, 57 y.o. Date of Encounter: 09/29/2013, 1:17 PM  Primary Physician: Abbe Amsterdam, MD  Chief Complaint:  Chief Complaint  Patient presents with  . head cold    1 week; has tried Robitussin and aleve  . Fever  . Sore Throat  . Otalgia  . Cough    HPI: 57 y.o. female everyday smoker with history of PNA x 2, anemia, arthritis, DM, and ulcer presents with a 7 day history of nasal congestion, post nasal drip, sore throat, and cough. Some sinus pressure. Subjective fevers and chills. Nasal congestion thick and green/yellow. Cough is productive of green/yellow sputum and worse at nighttime. Some SOB and wheezing. Ears feel full, leading to sensation of muffled hearing. Has tried OTC cold preps without success. Some diarrhea, however this is at baseline. Last episode of PNA was 2 years ago. Appetite decreased. She is pushing fluids. Multiple sick contacts. No recent antibiotics or recent travels abroad. The "yes" response on the infectious disease screen was an error on our triage. No leg trauma, sedentary periods, or h/o cancer.   Past Medical History  Diagnosis Date  . Anemia   . Arthritis   . Diabetes mellitus without complication   . Ulcer      Home Meds: Prior to Admission medications   Medication Sig Start Date End Date Taking? Authorizing Provider  losartan-hydrochlorothiazide (HYZAAR) 100-12.5 MG per tablet Take 1 tablet by mouth daily. 09/02/13  Yes Gwenlyn Found Copland, MD  losartan-hydrochlorothiazide (HYZAAR) 100-12.5 MG per tablet Take 1 tablet by mouth daily. 09/02/13  Yes Gwenlyn Found Copland, MD  metFORMIN (GLUCOPHAGE) 1000 MG tablet Take 1 tablet (1,000 mg total) by mouth 2 (two) times daily with a meal. 09/02/13  Yes Gwenlyn Found Copland, MD  pravastatin (PRAVACHOL) 20 MG tablet Take 1 tablet (20 mg total) by mouth daily. 09/03/13  Yes Pearline Cables, MD    Allergies:  Allergies  Allergen Reactions  .  Penicillins Hives    History   Social History  . Marital Status: Married    Spouse Name: N/A    Number of Children: N/A  . Years of Education: N/A   Occupational History  . Not on file.   Social History Main Topics  . Smoking status: Current Every Day Smoker  . Smokeless tobacco: Not on file  . Alcohol Use: No  . Drug Use: Not on file  . Sexual Activity: Not on file   Other Topics Concern  . Not on file   Social History Narrative  . No narrative on file     Review of Systems: Constitutional: positive for fever, chills, and fatigue HEENT: see above Cardiovascular: negative for chest pain or palpitations Respiratory: positive for cough, SOB, and wheezing Abdominal: positive for diarrhea. negative for abdominal pain, nausea, or vomiting  Dermatological: negative for rash Neurologic: positive for headache   Physical Exam: Blood pressure 152/80, pulse 96, temperature 98.4 F (36.9 C), temperature source Oral, resp. rate 20, height 5\' 2"  (1.575 m), weight 232 lb (105.235 kg), SpO2 97.00%., Body mass index is 42.42 kg/(m^2). General: Well developed, well nourished, in no acute distress. Head: Normocephalic, atraumatic, eyes without discharge, sclera non-icteric, nares are congested. Bilateral auditory canals clear, TM's are without perforation, pearly grey with reflective cone of light bilaterally. No sinus TTP. Oral cavity moist, dentition normal. Posterior pharynx with post nasal drip and mild erythema. No peritonsillar abscess or tonsillar exudate. Uvula midline.  Neck: Supple. No thyromegaly.  Full ROM. No lymphadenopathy. Lungs: Coarse breath sounds bilaterally without wheezes, rales, or rhonchi. Breathing is unlabored.  Heart: RRR with S1 S2. No murmurs, rubs, or gallops appreciated. Msk:  Strength and tone normal for age. Extremities: No clubbing or cyanosis. No edema. Neuro: Alert and oriented X 3. Moves all extremities spontaneously. CNII-XII grossly in tact. Psych:   Responds to questions appropriately with a normal affect.   Labs: Results for orders placed in visit on 09/29/13  POCT CBC      Result Value Range   WBC 11.9 (*) 4.6 - 10.2 K/uL   Lymph, poc 3.3  0.6 - 3.4   POC LYMPH PERCENT 27.7  10 - 50 %L   MID (cbc) 0.7  0 - 0.9   POC MID % 5.5  0 - 12 %M   POC Granulocyte 7.9 (*) 2 - 6.9   Granulocyte percent 66.8  37 - 80 %G   RBC 5.11  4.04 - 5.48 M/uL   Hemoglobin 9.9 (*) 12.2 - 16.2 g/dL   HCT, POC 16.1 (*) 09.6 - 47.9 %   MCV 64.9 (*) 80 - 97 fL   MCH, POC 19.4 (*) 27 - 31.2 pg   MCHC 29.8 (*) 31.8 - 35.4 g/dL   RDW, POC 04.5     Platelet Count, POC 323  142 - 424 K/uL   MPV 9.2  0 - 99.8 fL   Last A1C: 7.9 on 09/02/13  CXR:  [facility] reading (PRIMARY) by  Dr. Patsy Lager. Right middle lobe infiltrate     ASSESSMENT AND PLAN:  57 y.o. female with pneumonia, SOB, wheezing, fever, leukocytosis, cough, and anemia.  1) PNA/SOB/wheezing/fever/leukocytosis/cough  -Levaquin 500 mg 1 po daily #10 no RF -Tessalon Perles 100 mg 1 po tid prn cough #30 no RF  -Hycodan #4oz 1 tsp po q 4-6 hours prn cough no RF SED, to use only at nighttime -Mucinex -Tylenol/Motrin prn -Rest/fluids -Recheck 48 hours -Out of work next 48 hours  2) Anemia -Long standing issue -Advised patient to restart her iron  Signed, Eula Listen, PA-C [facility] [place], [place] [address] [phone] 09/29/2013 1:17 PM

## 2013-10-01 ENCOUNTER — Ambulatory Visit (INDEPENDENT_AMBULATORY_CARE_PROVIDER_SITE_OTHER): Payer: BC Managed Care – PPO | Admitting: Family Medicine

## 2013-10-01 VITALS — BP 126/76 | HR 88 | Temp 98.6°F | Resp 18 | Ht 62.5 in | Wt 228.4 lb

## 2013-10-01 DIAGNOSIS — J189 Pneumonia, unspecified organism: Secondary | ICD-10-CM

## 2013-10-01 DIAGNOSIS — D569 Thalassemia, unspecified: Secondary | ICD-10-CM

## 2013-10-01 DIAGNOSIS — D649 Anemia, unspecified: Secondary | ICD-10-CM

## 2013-10-01 NOTE — Progress Notes (Addendum)
Subjective:    Patient ID: Lynn [name], female    DOB: 1956-02-09, 57 y.o.   MRN: [id number]  This chart was scribed for Lynn Flood, MD by Blanchard Kelch, ED Scribe. The patient was seen in room 2. Patient's care was started at 10:53 AM.  PCP: Abbe Amsterdam, MD  Chief Complaint  Patient presents with  . Follow-up    Pneumonia, states she is starting to feel better    HPI  Lynn [name] is a 57 y.o. female who was seen two days ago by Eula Listen, PA-C with a seven day history of congestion, fever and chill, productive cough of green to yellow sputum and some associated dyspnea and wheeze. She has a history of pneumonia x2. She was diagnosed with right middle lobe pneumonia and started on Levaquin 500 mg QD, Tessalon, Hycodan as needed for cough. The radiology overread did not show any active cardiopulmonary disease. Initial white blood cell count was 11.9 K/uL on CBC. Also with history of anemia, hemoglobin 9.9 g/dL at last office visit. Advised to restart iron. Here for follow up.  She has been taking her antibiotic compliantly and has 3/10 doses so far. She reports that she still feels generally fatigued. The cough is improving but has not completely subsided. She has been able to sleep through the night. She is also taking the Tessalon compliantly but has not used the Hycodan. Her wheezing is improving as well but she has residual soreness in her throat with inspiration. She has not been using her C-PAP machine recently due to congestion. She denies any subjective fever, chills, nausea or vomiting. She also has had diarrhea about three times a day. She has not started back on her iron supplements yet. No abd pain, or other new side effects of abx.  Hx of thalesemia - prior CBC with HGB around 10.   Patient Active Problem List   Diagnosis Date Noted  . Thalassemia 09/02/2013  . Hyperlipidemia LDL goal < 100 08/24/2012  . Diabetes mellitus 07/22/2012  . Hypertension 07/22/2012    Past Medical History  Diagnosis Date  . Anemia   . Arthritis   . Diabetes mellitus without complication   . Ulcer    History reviewed. No pertinent past surgical history. Allergies  Allergen Reactions  . Penicillins Hives   Prior to Admission medications   Medication Sig Start Date End Date Taking? Authorizing Provider  benzonatate (TESSALON) 200 MG capsule Take 1 capsule (200 mg total) by mouth 2 (two) times daily as needed for cough. 09/29/13  Yes Ryan M Dunn, PA-C  levofloxacin (LEVAQUIN) 500 MG tablet Take 1 tablet (500 mg total) by mouth daily. 09/29/13  Yes Ryan M Dunn, PA-C  losartan-hydrochlorothiazide (HYZAAR) 100-12.5 MG per tablet Take 1 tablet by mouth daily. 09/02/13  Yes Gwenlyn Found Copland, MD  losartan-hydrochlorothiazide (HYZAAR) 100-12.5 MG per tablet Take 1 tablet by mouth daily. 09/02/13  Yes Gwenlyn Found Copland, MD  metFORMIN (GLUCOPHAGE) 1000 MG tablet Take 1 tablet (1,000 mg total) by mouth 2 (two) times daily with a meal. 09/02/13  Yes Gwenlyn Found Copland, MD  pravastatin (PRAVACHOL) 20 MG tablet Take 1 tablet (20 mg total) by mouth daily. 09/03/13  Yes Gwenlyn Found Copland, MD  HYDROcodone-homatropine (HYCODAN) 5-1.5 MG/5ML syrup 1 TSP PO Q 4-6 HOURS PRN COUGH 09/29/13   Sondra Barges, PA-C   History   Social History  . Marital Status: Married    Spouse Name: N/A    Number of Children: N/A  .  Years of Education: N/A   Occupational History  . Not on file.   Social History Main Topics  . Smoking status: Current Every Day Smoker  . Smokeless tobacco: Not on file  . Alcohol Use: No  . Drug Use: Not on file  . Sexual Activity: Not on file   Other Topics Concern  . Not on file   Social History Narrative  . No narrative on file    Review of Systems  Constitutional: Positive for appetite change. Negative for fever and chills.  HENT: Positive for sore throat.   Respiratory: Positive for cough.   Gastrointestinal: Positive for diarrhea. Negative for nausea  and vomiting.      Objective:   Physical Exam  Nursing note and vitals reviewed. Constitutional: She is oriented to person, place, and time. She appears well-developed and well-nourished. No distress.  HENT:  Head: Normocephalic and atraumatic.  Right Ear: Hearing, tympanic membrane, external ear and ear canal normal.  Left Ear: Hearing, tympanic membrane, external ear and ear canal normal.  Nose: Nose normal.  Mouth/Throat: Oropharynx is clear and moist and mucous membranes are normal. No oropharyngeal exudate.  Eyes: Conjunctivae and EOM are normal. Pupils are equal, round, and reactive to light.  Neck: Neck supple. No tracheal deviation present.  Minimal soreness around glands but no palpable adenopathy.   Cardiovascular: Normal rate, regular rhythm, normal heart sounds and intact distal pulses.   No murmur heard. Pulmonary/Chest: Effort normal and breath sounds normal. No stridor. No respiratory distress. She has no wheezes. She has no rhonchi. She has no rales.  Musculoskeletal: Normal range of motion. She exhibits no edema.  No lower extremity edema.   Neurological: She is alert and oriented to person, place, and time.  Skin: Skin is warm and dry. No rash noted.  Psychiatric: She has a normal mood and affect. Her behavior is normal.   Filed Vitals:   10/01/13 0918  BP: 126/76  Pulse: 88  Temp: 98.6 F (37 C)  TempSrc: Oral  Resp: 18  Height: 5' 2.5" (1.588 m)  Weight: 228 lb 6.4 oz (103.602 kg)  SpO2: 97%      Assessment & Plan:  Gabrella Stroh is a 57 y.o. female Pneumonia - RML on prior eval, reassuring report and exam/VSS today. Tolerating abx - few episodes of diarrhea, advised probiotics, rtc if worsens. Cont tessalon if needed, oow 1 more day - note given, rtc precautions.   Anemia, hx of Thalassemia - overall stable based on prior CBC's. rtc precautions given.   No orders of the defined types were placed in this encounter.   Patient Instructions  Continue  antibiotic as prescribed, drink plenty of fluids. Probiotic or yogurt ok while on antibiotic, but if diarrhea worsens - return for recheck.  Return to the clinic or go to the nearest emergency room if any of your symptoms worsen or new symptoms occur.  Keep routine follow up with your primary care provider for your anemia. Sooner if new symptoms.   I personally performed the services described in this documentation, which was scribed in my presence. The recorded information has been reviewed and considered, and addended by me as needed.

## 2013-10-01 NOTE — Patient Instructions (Signed)
Continue antibiotic as prescribed, drink plenty of fluids. Probiotic or yogurt ok while on antibiotic, but if diarrhea worsens - return for recheck.  Return to the clinic or go to the nearest emergency room if any of your symptoms worsen or new symptoms occur.  Keep routine follow up with your primary care provider for your anemia. Sooner if new symptoms.

## 2014-07-28 ENCOUNTER — Ambulatory Visit (INDEPENDENT_AMBULATORY_CARE_PROVIDER_SITE_OTHER): Payer: BC Managed Care – PPO | Admitting: Family Medicine

## 2014-07-28 ENCOUNTER — Other Ambulatory Visit: Payer: Self-pay | Admitting: Family Medicine

## 2014-07-28 VITALS — BP 128/72 | HR 95 | Temp 98.0°F | Resp 16 | Ht 62.5 in | Wt 228.0 lb

## 2014-07-28 DIAGNOSIS — I10 Essential (primary) hypertension: Secondary | ICD-10-CM

## 2014-07-28 DIAGNOSIS — E669 Obesity, unspecified: Secondary | ICD-10-CM

## 2014-07-28 DIAGNOSIS — Z9989 Dependence on other enabling machines and devices: Secondary | ICD-10-CM

## 2014-07-28 DIAGNOSIS — M79604 Pain in right leg: Secondary | ICD-10-CM

## 2014-07-28 DIAGNOSIS — E7800 Pure hypercholesterolemia, unspecified: Secondary | ICD-10-CM

## 2014-07-28 DIAGNOSIS — G4733 Obstructive sleep apnea (adult) (pediatric): Secondary | ICD-10-CM

## 2014-07-28 DIAGNOSIS — E785 Hyperlipidemia, unspecified: Secondary | ICD-10-CM

## 2014-07-28 DIAGNOSIS — M79605 Pain in left leg: Secondary | ICD-10-CM

## 2014-07-28 DIAGNOSIS — R202 Paresthesia of skin: Secondary | ICD-10-CM

## 2014-07-28 DIAGNOSIS — M79641 Pain in right hand: Secondary | ICD-10-CM

## 2014-07-28 DIAGNOSIS — R200 Anesthesia of skin: Secondary | ICD-10-CM

## 2014-07-28 DIAGNOSIS — E1169 Type 2 diabetes mellitus with other specified complication: Secondary | ICD-10-CM

## 2014-07-28 DIAGNOSIS — E119 Type 2 diabetes mellitus without complications: Secondary | ICD-10-CM

## 2014-07-28 LAB — COMPREHENSIVE METABOLIC PANEL
ALT: 21 U/L (ref 0–35)
AST: 18 U/L (ref 0–37)
Albumin: 4.3 g/dL (ref 3.5–5.2)
Alkaline Phosphatase: 93 U/L (ref 39–117)
BUN: 12 mg/dL (ref 6–23)
CO2: 27 mEq/L (ref 19–32)
Calcium: 10 mg/dL (ref 8.4–10.5)
Chloride: 101 mEq/L (ref 96–112)
Creat: 0.96 mg/dL (ref 0.50–1.10)
Glucose, Bld: 153 mg/dL — ABNORMAL HIGH (ref 70–99)
Potassium: 4.3 mEq/L (ref 3.5–5.3)
Sodium: 136 mEq/L (ref 135–145)
Total Bilirubin: 0.4 mg/dL (ref 0.2–1.2)
Total Protein: 7.4 g/dL (ref 6.0–8.3)

## 2014-07-28 LAB — CBC
HCT: 31.1 % — ABNORMAL LOW (ref 36.0–46.0)
Hemoglobin: 10 g/dL — ABNORMAL LOW (ref 12.0–15.0)
MCH: 18.9 pg — ABNORMAL LOW (ref 26.0–34.0)
MCHC: 32.2 g/dL (ref 30.0–36.0)
MCV: 58.9 fL — ABNORMAL LOW (ref 78.0–100.0)
Platelets: 298 10*3/uL (ref 150–400)
RBC: 5.28 MIL/uL — ABNORMAL HIGH (ref 3.87–5.11)
RDW: 16.3 % — ABNORMAL HIGH (ref 11.5–15.5)
WBC: 8.4 10*3/uL (ref 4.0–10.5)

## 2014-07-28 LAB — LIPID PANEL
Cholesterol: 191 mg/dL (ref 0–200)
HDL: 48 mg/dL (ref >39)
LDL Cholesterol: 119 mg/dL — ABNORMAL HIGH (ref 0–99)
Total CHOL/HDL Ratio: 4 Ratio
Triglycerides: 121 mg/dL (ref <150)
VLDL: 24 mg/dL (ref 0–40)

## 2014-07-28 LAB — POCT GLYCOSYLATED HEMOGLOBIN (HGB A1C): Hemoglobin A1C: 8.2

## 2014-07-28 LAB — TSH: TSH: 1.469 u[IU]/mL (ref 0.350–4.500)

## 2014-07-28 LAB — MICROALBUMIN, URINE: Microalb, Ur: 0.3 mg/dL (ref <2.0)

## 2014-07-28 LAB — RHEUMATOID FACTOR: Rhuematoid fact SerPl-aCnc: 11 IU/mL (ref <=14)

## 2014-07-28 LAB — SEDIMENTATION RATE: Sed Rate: 34 mm/hr — ABNORMAL HIGH (ref 0–22)

## 2014-07-28 MED ORDER — METFORMIN HCL 1000 MG PO TABS: 1000.0000 mg | ORAL_TABLET | Freq: Two times a day (BID) | ORAL | Status: DC

## 2014-07-28 MED ORDER — GLIPIZIDE 5 MG PO TABS: 5.0000 mg | ORAL_TABLET | Freq: Two times a day (BID) | ORAL | Status: DC

## 2014-07-28 MED ORDER — LOSARTAN POTASSIUM-HCTZ 100-12.5 MG PO TABS: 1.0000 | ORAL_TABLET | Freq: Every day | ORAL | Status: DC

## 2014-07-28 MED ORDER — PRAVASTATIN SODIUM 20 MG PO TABS: 20.0000 mg | ORAL_TABLET | Freq: Every day | ORAL | Status: DC

## 2014-07-28 NOTE — Patient Instructions (Addendum)
I will be in touch with the rest of your labs; we will try to find out why your entire body seems to be hurting.  In the meantime please work on a better diet, make sure you are getting enough sleep and try to exercise more.  I am going to refer you to orthopedics to look at your elbow and hand pain; you may have some tennis elbow and carpal tunnel syndrome that can be improved    We are going to add glipizide to your diabetes regimen- take this once a day Please see [place] in 2-3 months to recheck your diabetes control.

## 2014-07-28 NOTE — Progress Notes (Signed)
[facility] [address], [place] [address] [phone]  Date:  07/28/2014   Name:  Lynn [name]   DOB:  [birth year]   MRN:  [id number]  PCP:  Lamar Blinks, MD    Chief Complaint: Arm Pain, Leg Pain, Elbow Pain, Numbness, Extremity Weakness, Follow-up and Medication Refill   History of Present Illness:  Lynn [name] is a 58 y.o. very pleasant female patient who presents with the following:  Seen by myself once about one year ago; did not follow-up as directed.  Here today with multiple complaints.   She would like to follow-up today on her chronic medical issues and also notes pain over most of her body as below.   She has complaint of "pins and needles in my right foot," just the right foot. She has noted this for 2 years. Her right hand and arm have also bothered her for a couple of years.  She is right handed, and drives a transit bus.  Driving the bus does make her pain worse.  She notes that "I have no strength" in her right hand.  Bending the arm hurts her right elbow.  She notes some tingling in the right long and ring fingers, and her grip seems to be weak.  It "is constant, to the point where I'm not getting any sleep."  Aleve does help but seems to make her tired.    Both of her legs are also painful and weak.  "just weakness, just constant pain from my hips all the way down my legs, sometimes I can't even stand up."  This has been an issue for "3 or 4 years, just getting worse and worse.  It started on the right hand side.  Also, my feet hurt constantly."    She is fasting today except for coffee.  Notes that she watches her glucose and BP, and BP is generally very good.  Glucose can be elevated at times.  She is s/p hysterectomy in the 1990s due to fibroids- never had any GYN cancer.  She also had ectopic pregnancy X2.   She plans to do her eye exam soon.  Also needs to do her mammogram soon.     She is currently on vacation and feels that  she needs to be "written out of work" this coming Sunday and Monday.  She states that taking aleve to control her current pain causes her to feel too sleepy to work.  She states that tylenol, ibuprofen and aleve all cause her to feel sleepy  Lab Results  Component Value Date   HGBA1C 7.9 09/02/2013     Patient Active Problem List   Diagnosis Date Noted  . Thalassemia 09/02/2013  . Hyperlipidemia LDL goal < 100 08/24/2012  . Diabetes mellitus 07/22/2012  . Hypertension 07/22/2012    Past Medical History  Diagnosis Date  . Anemia   . Arthritis   . Diabetes mellitus without complication   . Ulcer     History reviewed. No pertinent past surgical history.  History  Substance Use Topics  . Smoking status: Current Every Day Smoker  . Smokeless tobacco: Not on file  . Alcohol Use: No    Family History  Problem Relation Age of Onset  . Cancer Mother   . Cancer Father   . Mental illness Sister   . Diabetes Maternal Grandmother   . Diabetes Maternal Grandfather     Allergies  Allergen Reactions  . Penicillins Hives  Medication list has been reviewed and updated.  Current Outpatient Prescriptions on File Prior to Visit  Medication Sig Dispense Refill  . losartan-hydrochlorothiazide (HYZAAR) 100-12.5 MG per tablet Take 1 tablet by mouth daily.  180 tablet  3  . metFORMIN (GLUCOPHAGE) 1000 MG tablet Take 1 tablet (1,000 mg total) by mouth 2 (two) times daily with a meal.  180 tablet  3  . pravastatin (PRAVACHOL) 20 MG tablet Take 1 tablet (20 mg total) by mouth daily.  90 tablet  3  . benzonatate (TESSALON) 200 MG capsule Take 1 capsule (200 mg total) by mouth 2 (two) times daily as needed for cough.  20 capsule  0  . HYDROcodone-homatropine (HYCODAN) 5-1.5 MG/5ML syrup 1 TSP PO Q 4-6 HOURS PRN COUGH  120 mL  0  . levofloxacin (LEVAQUIN) 500 MG tablet Take 1 tablet (500 mg total) by mouth daily.  10 tablet  0  . losartan-hydrochlorothiazide (HYZAAR) 100-12.5 MG per tablet  Take 1 tablet by mouth daily.  30 tablet  3   No current facility-administered medications on file prior to visit.    Review of Systems:  As per HPI- otherwise negative.   Physical Examination: Filed Vitals:   07/28/14 0824  BP: 128/72  Pulse: 95  Temp: 98 F (36.7 C)  Resp: 16   Filed Vitals:   07/28/14 0824  Height: 5' 2.5" (1.588 m)  Weight: 228 lb (103.42 kg)   Body mass index is 41.01 kg/(m^2). Ideal Body Weight: Weight in (lb) to have BMI = 25: 138.6  GEN: WDWN, NAD, Non-toxic, A & O x 3, obese, looks well HEENT: Atraumatic, Normocephalic. Neck supple. No masses, No LAD.  Bilateral TM wnl, oropharynx normal.  PEERL,EOMI.   Ears and Nose: No external deformity. CV: RRR, No M/G/R. No JVD. No thrill. No extra heart sounds. PULM: CTA B, no wheezes, crackles, rhonchi. No retractions. No resp. distress. No accessory muscle use. ABD: S, NT, ND, +BS. No rebound. No HSM. EXTR: No c/c/e NEURO Normal gait.  PSYCH: Normally interactive. Conversant. Not depressed or anxious appearing.  Calm demeanor.  Foot exam performed- normal perfusion, slight decreased sensation right great toe. Knees without crepitus, heat, effusion.  Normal ROM Normal strength of BUE and BLE , normal DTR  Results for orders placed in visit on 07/28/14  POCT GLYCOSYLATED HEMOGLOBIN (HGB A1C)      Result Value Ref Range   Hemoglobin A1C 8.2      Assessment and Plan: Diabetes mellitus type 2 in obese - Plan: POCT glycosylated hemoglobin (Hb A1C), Microalbumin, urine, metFORMIN (GLUCOPHAGE) 1000 MG tablet, glipiZIDE (GLUCOTROL) 5 MG tablet  Hyperlipidemia - Plan: Comprehensive metabolic panel, Lipid panel  Obesity - Plan: TSH  Right hand pain - Plan: Sedimentation Rate, Rheumatoid factor, ANA, Ambulatory referral to Orthopedic Surgery  Numbness and tingling in right hand - Plan: TSH, Sedimentation Rate, Rheumatoid factor, ANA, Ambulatory referral to Orthopedic Surgery  Bilateral leg pain - Plan:  CBC, Sedimentation Rate, Rheumatoid factor, ANA  Essential hypertension, benign - Plan: losartan-hydrochlorothiazide (HYZAAR) 100-12.5 MG per tablet  High cholesterol - Plan: pravastatin (PRAVACHOL) 20 MG tablet  OSA on CPAP  DM is uncontrolled.  She does not think she will be able to make significant progress with diet and lifestyle changes anytime soon and would like to add a second medication. Add glipizide 5mg  Suspect her right arm and hand sx may be due to tennis elbow and/ or CTS. Referral to orthopedic surgery for further evaluation Generalized body pain  and leg pain.  Suspect obesity and deconditioning are large factors here.  Will check labs as above to look for any other cause.   Explained to her that she does need to follow-up more regularly in order to properly care of her DM.    Signed Lamar Blinks, MD

## 2014-07-29 ENCOUNTER — Encounter: Payer: Self-pay | Admitting: Family Medicine

## 2014-07-29 LAB — FERRITIN: Ferritin: 66 ng/mL (ref 10–291)

## 2014-07-29 LAB — ANA: Anti Nuclear Antibody(ANA): NEGATIVE

## 2014-10-09 ENCOUNTER — Ambulatory Visit (INDEPENDENT_AMBULATORY_CARE_PROVIDER_SITE_OTHER): Payer: BC Managed Care – PPO

## 2014-10-09 ENCOUNTER — Ambulatory Visit (INDEPENDENT_AMBULATORY_CARE_PROVIDER_SITE_OTHER): Payer: BC Managed Care – PPO | Admitting: Family Medicine

## 2014-10-09 VITALS — BP 128/82 | HR 94 | Temp 98.0°F | Resp 16 | Ht 62.5 in | Wt 230.0 lb

## 2014-10-09 DIAGNOSIS — M25562 Pain in left knee: Secondary | ICD-10-CM

## 2014-10-09 DIAGNOSIS — Z23 Encounter for immunization: Secondary | ICD-10-CM

## 2014-10-09 MED ORDER — DICLOFENAC SODIUM 75 MG PO TBEC: 75.0000 mg | DELAYED_RELEASE_TABLET | Freq: Two times a day (BID) | ORAL | Status: DC

## 2014-10-09 NOTE — Progress Notes (Signed)
Discussed with [place], PA-C.  Xray examined:  Mild medial narrowing.   Examined:  Medial tenderness c/w pes anserine bursitis.  Injection offered and declined.  Pt chose oral meds, will follow with ortho.

## 2014-10-09 NOTE — Patient Instructions (Signed)
Your knee xray showed possible mild osteoarthritis on the inside of your left knee. You may also have something called pes anserine bursitis in that area.  Please take the voltaren twice daily as needed until you see ortho. Please stop taking the aleve while you take the voltaren.  If you change your mind about the injection please return to clinic. We have given you a work note for today and tomorrow.

## 2014-10-09 NOTE — Progress Notes (Signed)
Subjective:    Patient ID: Lynn [name], female    DOB: Dec 09, 1955, 58 y.o.   MRN: [id number]  PCP: Lamar Blinks, MD  Chief Complaint  Patient presents with  . Knee Pain    x1 month left knee  . Flu Vaccine   Patient Active Problem List   Diagnosis Date Noted  . Thalassemia 09/02/2013  . Hyperlipidemia LDL goal < 100 08/24/2012  . Diabetes mellitus 07/22/2012  . Hypertension 07/22/2012   Prior to Admission medications   Medication Sig Start Date End Date Taking? Authorizing Provider  glipiZIDE (GLUCOTROL) 5 MG tablet Take 1 tablet (5 mg total) by mouth 2 (two) times daily before a meal. 07/28/14  Yes Jessica C Copland, MD  losartan-hydrochlorothiazide (HYZAAR) 100-12.5 MG per tablet Take 1 tablet by mouth daily. 07/28/14  Yes Gay Filler Copland, MD  metFORMIN (GLUCOPHAGE) 1000 MG tablet Take 1 tablet (1,000 mg total) by mouth 2 (two) times daily with a meal. 07/28/14  Yes Gay Filler Copland, MD  pravastatin (PRAVACHOL) 20 MG tablet Take 1 tablet (20 mg total) by mouth daily. 07/28/14  Yes [name], MD   Medications, allergies, past medical history, surgical history, family history, social history and problem list reviewed and updated.  HPI  15 yof with pmh htn, hld, dm2 presents with one month hx left knee pain.  Pain started approx one month ago. No trauma. Has been steady for past month but worse with lying down or even when sitting. Pain is described as severe throughout most of the day. It is located at medial knee but occasionally moves up both her anterior and posterior thigh into her left hip and left lumbar area. Vincenza Hews it also radiated into her left shoulder region. No relief with aleve. It has prevented her from getting good sleep for past few weeks. 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 Had to call into work today as she could not sleep last night due to the pain. Has been nauseated several times recently as pain is so severe.     Denies any calf pain, swelling, or erythema. No sob, pleuritic cp, or hemoptysis.   She was referred to ortho by Dr Lorelei Pont for carpal tunnel and is seeing them next month for that. She recently called ortho herself and set up to also see them for her left knee. This appt is scheduled for 10/19/14.   States she had a right knee injury in the 1990s and was told she was bone on bone at that point.   Review of Systems No CP, fever, chills.     Objective:   Physical Exam  Constitutional: She is oriented to person, place, and time. She appears well-developed and well-nourished.  Non-toxic appearance. She does not have a sickly appearance. She does not appear ill. No distress.  BP 128/82 mmHg  Pulse 94  Temp(Src) 98 F (36.7 C) (Oral)  Resp 16  Ht 5' 2.5" (1.588 m)  Wt 230 lb (104.327 kg)  BMI 41.37 kg/m2  SpO2 99%   Musculoskeletal:       Left hip: She exhibits normal strength, no tenderness and no bony tenderness.       Left knee: She exhibits decreased range of motion and abnormal meniscus. She exhibits no bony tenderness and no MCL laxity. Tenderness found. Medial joint line tenderness noted.       Lumbar back: Normal. She exhibits no tenderness and no bony tenderness.  Pain is  located over medial joint line. Point tenderness over medial knee. No mcl laxity but severe pain with valgus testing. No lcl laxity. Neg anterior/posterior drawer. Positive grind test. No erythema, effusion, or swelling.   Left hip and lumbar spine without muscular or bony tenderness.   Neurological: She is alert and oriented to person, place, and time.   [facility] reading (PRIMARY) by  Dr. . Jayme Cloud: Mild medial narrowing. No acute injury.      Assessment & Plan:   56 yof with pmh htn, hld, dm2 presents with one month hx left knee pain.  Left knee pain - Plan: DG Knee Complete 4 Views Left, diclofenac (VOLTAREN) 75 MG EC tablet --mild oa on xr --possible pes anserine bursitis with point tenderness  --pt  declines steroid injx as she is seeing ortho in 10 days and does not want to interrupt their assessment --voltaren bid --pt instructed to rtc few days if pain persists and would like cortisone injx --work note today and tomorrow  Need for immunization against influenza - Plan: Flu Vaccine QUAD 36+ mos IM  Julieta Gutting, PA-C Physician Assistant-Certified [facility]  10/09/2014 10:56 AM

## 2014-11-01 HISTORY — PX: CARPAL TUNNEL RELEASE: SHX101

## 2014-11-15 HISTORY — PX: KNEE ARTHROSCOPY: SHX127

## 2014-11-26 ENCOUNTER — Ambulatory Visit (INDEPENDENT_AMBULATORY_CARE_PROVIDER_SITE_OTHER): Payer: BLUE CROSS/BLUE SHIELD | Admitting: Family Medicine

## 2014-11-26 ENCOUNTER — Telehealth: Payer: Self-pay | Admitting: Family Medicine

## 2014-11-26 VITALS — BP 124/80 | HR 75 | Temp 97.7°F | Resp 18 | Ht 63.25 in | Wt 231.0 lb

## 2014-11-26 DIAGNOSIS — Z01818 Encounter for other preprocedural examination: Secondary | ICD-10-CM

## 2014-11-26 LAB — COMPREHENSIVE METABOLIC PANEL
ALT: 17 U/L (ref 0–35)
AST: 13 U/L (ref 0–37)
Albumin: 4.1 g/dL (ref 3.5–5.2)
Alkaline Phosphatase: 94 U/L (ref 39–117)
BUN: 17 mg/dL (ref 6–23)
CO2: 27 mEq/L (ref 19–32)
Calcium: 9.8 mg/dL (ref 8.4–10.5)
Chloride: 101 mEq/L (ref 96–112)
Creat: 1.03 mg/dL (ref 0.50–1.10)
Glucose, Bld: 164 mg/dL — ABNORMAL HIGH (ref 70–99)
Potassium: 4.2 mEq/L (ref 3.5–5.3)
Sodium: 138 mEq/L (ref 135–145)
Total Bilirubin: 0.2 mg/dL (ref 0.2–1.2)
Total Protein: 7.3 g/dL (ref 6.0–8.3)

## 2014-11-26 LAB — CBC
HCT: 32.7 % — ABNORMAL LOW (ref 36.0–46.0)
Hemoglobin: 10.5 g/dL — ABNORMAL LOW (ref 12.0–15.0)
MCH: 19.3 pg — ABNORMAL LOW (ref 26.0–34.0)
MCHC: 32.1 g/dL (ref 30.0–36.0)
MCV: 60 fL — ABNORMAL LOW (ref 78.0–100.0)
MPV: 10 fL (ref 8.6–12.4)
Platelets: 349 10*3/uL (ref 150–400)
RBC: 5.45 MIL/uL — ABNORMAL HIGH (ref 3.87–5.11)
RDW: 16.1 % — ABNORMAL HIGH (ref 11.5–15.5)
WBC: 9.2 10*3/uL (ref 4.0–10.5)

## 2014-11-26 NOTE — Telephone Encounter (Signed)
Called her- I received a pre- op clearance form from [facility] to have left knee surgery.  She plans to come in today

## 2014-11-26 NOTE — Progress Notes (Addendum)
[facility] [address] [place] [address] [phone]  Date:  11/26/2014   Name:  Lynn [name]   DOB:  04-12-1956   MRN:  [id number]  PCP:  Lamar Blinks, MD    Chief Complaint: Surgical Clearance   History of Present Illness:  Lynn [name] is a 59 y.o. very pleasant female patient who presents with the following:  Here today for pre- operative clearance.  Dr. Veverly Fells of [facility] would like to do a left knee operation for her for meniscal tear. She has a history of HTN, DM, and high cholesterol.  She just recently had a right carpal tunnel release under local anesthesia which went well  She is not aware of any history of CAD, she did have a stress test approx in 2009- it was normal.    She is able to perform household chores, walk briskly, carry groceries, etc without any problems such as CP or SOB.    Patient Active Problem List   Diagnosis Date Noted  . Thalassemia 09/02/2013  . Hyperlipidemia LDL goal < 100 08/24/2012  . Diabetes mellitus 07/22/2012  . Hypertension 07/22/2012    Past Medical History  Diagnosis Date  . Anemia   . Arthritis   . Diabetes mellitus without complication   . Ulcer     Past Surgical History  Procedure Laterality Date  . Carpal tunnel release Right 11/01/2014    History  Substance Use Topics  . Smoking status: Current Every Day Smoker  . Smokeless tobacco: Not on file  . Alcohol Use: No    Family History  Problem Relation Age of Onset  . Cancer Mother   . Cancer Father   . Mental illness Sister   . Diabetes Maternal Grandmother   . Diabetes Maternal Grandfather     Allergies  Allergen Reactions  . Penicillins Hives    Medication list has been reviewed and updated.  Current Outpatient Prescriptions on File Prior to Visit  Medication Sig Dispense Refill  . diclofenac (VOLTAREN) 75 MG EC tablet Take 1 tablet (75 mg total) by mouth 2 (two) times daily. 30 tablet 0  . glipiZIDE (GLUCOTROL)  5 MG tablet Take 1 tablet (5 mg total) by mouth 2 (two) times daily before a meal. 90 tablet 3  . losartan-hydrochlorothiazide (HYZAAR) 100-12.5 MG per tablet Take 1 tablet by mouth daily. 90 tablet 3  . metFORMIN (GLUCOPHAGE) 1000 MG tablet Take 1 tablet (1,000 mg total) by mouth 2 (two) times daily with a meal. 180 tablet 3  . pravastatin (PRAVACHOL) 20 MG tablet Take 1 tablet (20 mg total) by mouth daily. 90 tablet 3   No current facility-administered medications on file prior to visit.    Review of Systems:  As per HPI- otherwise negative.   Physical Examination: Filed Vitals:   11/26/14 1338  BP: 124/80  Pulse: 75  Temp: 97.7 F (36.5 C)  Resp: 18   Filed Vitals:   11/26/14 1338  Height: 5' 3.25" (1.607 m)  Weight: 231 lb (104.781 kg)   Body mass index is 40.57 kg/(m^2). Ideal Body Weight: Weight in (lb) to have BMI = 25: 142  GEN: WDWN, NAD, Non-toxic, A & O x 3 HEENT: Atraumatic, Normocephalic. Neck supple. No masses, No LAD. Ears and Nose: No external deformity. CV: RRR, No M/G/R. No JVD. No thrill. No extra heart sounds. PULM: CTA B, no wheezes, crackles, rhonchi. No retractions. No resp. distress. No accessory muscle use. ABD: S, NT, ND, +  BS. No rebound. No HSM. EXTR: No c/c/e NEURO Normal gait.  PSYCH: Normally interactive. Conversant. Not depressed or anxious appearing.  Calm demeanor.   EKG: NSR with low voltage in precordial leads which I suspect is due to her body habitus and large breasts .  Assessment and Plan: Pre-op examination - Plan: EKG 12-Lead, CBC, Comprehensive metabolic panel, Hemoglobin A1c  Await her labs and expect that we will be able to clear her for surgery   Signed Lamar Blinks, MD  2/13: received her labs as below:  Cleared for surgery- LMOM that I have cleared her but still want to talk about her DM  Results for orders placed or performed in visit on 11/26/14  CBC  Result Value Ref Range   WBC 9.2 4.0 - 10.5 K/uL   RBC 5.45  (H) 3.87 - 5.11 MIL/uL   Hemoglobin 10.5 (L) 12.0 - 15.0 g/dL   HCT 32.7 (L) 36.0 - 46.0 %   MCV 60.0 (L) 78.0 - 100.0 fL   MCH 19.3 (L) 26.0 - 34.0 pg   MCHC 32.1 30.0 - 36.0 g/dL   RDW 16.1 (H) 11.5 - 15.5 %   Platelets 349 150 - 400 K/uL   MPV 10.0 8.6 - 12.4 fL  Comprehensive metabolic panel  Result Value Ref Range   Sodium 138 135 - 145 mEq/L   Potassium 4.2 3.5 - 5.3 mEq/L   Chloride 101 96 - 112 mEq/L   CO2 27 19 - 32 mEq/L   Glucose, Bld 164 (H) 70 - 99 mg/dL   BUN 17 6 - 23 mg/dL   Creat 1.03 0.50 - 1.10 mg/dL   Total Bilirubin 0.2 0.2 - 1.2 mg/dL   Alkaline Phosphatase 94 39 - 117 U/L   AST 13 0 - 37 U/L   ALT 17 0 - 35 U/L   Total Protein 7.3 6.0 - 8.3 g/dL   Albumin 4.1 3.5 - 5.2 g/dL   Calcium 9.8 8.4 - 10.5 mg/dL  Hemoglobin A1c  Result Value Ref Range   Hgb A1c MFr Bld 8.3 (H) <5.7 %   Mean Plasma Glucose 192 (H) <117 mg/dL

## 2014-11-26 NOTE — Patient Instructions (Signed)
It looks like we should be able to clear you for surgery without a problem.  I will be in touch with your labs and will fax your form to Dr. Veverly Fells assuming all is ok

## 2014-11-27 LAB — HEMOGLOBIN A1C
Hgb A1c MFr Bld: 8.3 % — ABNORMAL HIGH (ref <5.7)
Mean Plasma Glucose: 192 mg/dL — ABNORMAL HIGH (ref <117)

## 2014-12-06 ENCOUNTER — Encounter: Payer: Self-pay | Admitting: Family Medicine

## 2014-12-07 ENCOUNTER — Encounter: Payer: Self-pay | Admitting: Family Medicine

## 2014-12-12 ENCOUNTER — Encounter: Payer: Self-pay | Admitting: Family Medicine

## 2014-12-12 DIAGNOSIS — E1165 Type 2 diabetes mellitus with hyperglycemia: Secondary | ICD-10-CM

## 2014-12-12 DIAGNOSIS — IMO0002 Reserved for concepts with insufficient information to code with codable children: Secondary | ICD-10-CM

## 2014-12-14 MED ORDER — DULAGLUTIDE 0.75 MG/0.5ML ~~LOC~~ SOAJ
SUBCUTANEOUS | Status: DC
Start: 2014-12-14 — End: 2015-11-10

## 2015-01-06 ENCOUNTER — Telehealth: Payer: Self-pay | Admitting: Family Medicine

## 2015-01-06 NOTE — Telephone Encounter (Signed)
Received another pre- op clearance form from Dr. Veverly Fells- she is now going to have a right total knee (had a left knee scops in February).  As I saw her for clearance visit just las month will clear her again. Called and [facility] for pt with this info, asked her to please see me soon to discuss her DM control.

## 2015-01-14 DIAGNOSIS — H5789 Other specified disorders of eye and adnexa: Secondary | ICD-10-CM

## 2015-01-14 HISTORY — DX: Other specified disorders of eye and adnexa: H57.89

## 2015-01-16 ENCOUNTER — Encounter: Payer: Self-pay | Admitting: Family Medicine

## 2015-01-18 ENCOUNTER — Encounter: Payer: Self-pay | Admitting: Family Medicine

## 2015-01-19 MED ORDER — BLOOD GLUCOSE MONITOR KIT: PACK | Status: DC

## 2015-01-21 ENCOUNTER — Encounter: Payer: Self-pay | Admitting: Family Medicine

## 2015-02-07 ENCOUNTER — Encounter (HOSPITAL_COMMUNITY)
Admission: RE | Admit: 2015-02-07 | Discharge: 2015-02-07 | Disposition: A | Discharge status: Home / Self Care | Payer: BLUE CROSS/BLUE SHIELD | Source: Ambulatory Visit | Attending: Orthopedic Surgery | Admitting: Orthopedic Surgery

## 2015-02-07 ENCOUNTER — Encounter (HOSPITAL_COMMUNITY): Payer: Self-pay

## 2015-02-07 ENCOUNTER — Telehealth: Payer: Self-pay

## 2015-02-07 DIAGNOSIS — Z01812 Encounter for preprocedural laboratory examination: Secondary | ICD-10-CM | POA: Diagnosis present

## 2015-02-07 DIAGNOSIS — M1711 Unilateral primary osteoarthritis, right knee: Secondary | ICD-10-CM | POA: Insufficient documentation

## 2015-02-07 HISTORY — DX: Sleep apnea, unspecified: G47.30

## 2015-02-07 HISTORY — DX: Essential (primary) hypertension: I10

## 2015-02-07 LAB — CBC
HCT: 32.3 % — ABNORMAL LOW (ref 36.0–46.0)
Hemoglobin: 10 g/dL — ABNORMAL LOW (ref 12.0–15.0)
MCH: 19.2 pg — ABNORMAL LOW (ref 26.0–34.0)
MCHC: 31 g/dL (ref 30.0–36.0)
MCV: 62 fL — ABNORMAL LOW (ref 78.0–100.0)
Platelets: 302 10*3/uL (ref 150–400)
RBC: 5.21 MIL/uL — ABNORMAL HIGH (ref 3.87–5.11)
RDW: 16 % — ABNORMAL HIGH (ref 11.5–15.5)
WBC: 9.1 10*3/uL (ref 4.0–10.5)

## 2015-02-07 LAB — BASIC METABOLIC PANEL
Anion gap: 10 (ref 5–15)
BUN: 14 mg/dL (ref 6–23)
CO2: 25 mmol/L (ref 19–32)
Calcium: 9.6 mg/dL (ref 8.4–10.5)
Chloride: 101 mmol/L (ref 96–112)
Creatinine, Ser: 1.02 mg/dL (ref 0.50–1.10)
GFR calc Af Amer: 67 mL/min — ABNORMAL LOW (ref >90)
GFR calc non Af Amer: 58 mL/min — ABNORMAL LOW (ref >90)
Glucose, Bld: 101 mg/dL — ABNORMAL HIGH (ref 70–99)
Potassium: 3.7 mmol/L (ref 3.5–5.1)
Sodium: 136 mmol/L (ref 135–145)

## 2015-02-07 LAB — ABO/RH: ABO/RH(D): O POS

## 2015-02-07 LAB — SURGICAL PCR SCREEN
MRSA, PCR: NEGATIVE
Staphylococcus aureus: NEGATIVE

## 2015-02-07 NOTE — Pre-Procedure Instructions (Signed)
Lynn [name]  02/07/2015   Your procedure is scheduled on:  02-18-2015   Friday   Report to [facility] at  8:30  AM.   Call this number if you have problems the morning of surgery: [phone]   Remember:   Do not eat food or drink liquids after midnight.    Take these medicines the morning of surgery with A SIP OF WATER: none   Do not wear jewelry, make-up or nail polish.  Do not wear lotions, powders, or perfumes.   Do not shave 48 hours prior to surgery. .  Do not bring valuables to the hospital.  [facility] is not responsible for any belongings or valuables.               Contacts, dentures or bridgework may not be worn into surgery.   Leave suitcase in the car. After surgery it may be brought to your room.   For patients admitted to the hospital, discharge time is determined by your  treatment team.               Patients discharged the day of surgery will not be allowed to drive home.      Special Instructions: See attached sheet for instructions on CHG  Shower/bath   Please read over the following fact sheets that you were given: Pain Booklet, Coughing and Deep Breathing, Blood Transfusion Information and Surgical Site Infection Prevention

## 2015-02-07 NOTE — Progress Notes (Signed)
Unable to locate Sleep Study.

## 2015-02-07 NOTE — Telephone Encounter (Signed)
Ardine Eng, RN at [facility] left message for sleep study to be faxed. There is not sleep study in Epic or paper chart. Tried to call back but no answer, phone kept ringing. Faxed a note instead with confirmation to [phone].

## 2015-02-08 NOTE — H&P (Signed)
Lynn [name] is an 59 y.o. female.    Chief Complaint: right knee pain  HPI: Pt is a 59 y.o. female complaining of right knee pain for multiple years. Pain had continually increased since the beginning. X-rays in the clinic show end-stage arthritic changes of the right knee. Pt has tried various conservative treatments which have failed to alleviate their symptoms, including injections and therapy. Various options are discussed with the patient. Risks, benefits and expectations were discussed with the patient. Patient understand the risks, benefits and expectations and wishes to proceed with surgery.   PCP:  Lamar Blinks, MD  D/C Plans: Home  PMH: Past Medical History  Diagnosis Date  . Arthritis   . Diabetes mellitus without complication   . Ulcer   . Hypertension   . Sleep apnea     use CPAP  . Shortness of breath dyspnea     exertion  . Anemia     thalasemia    PSH: Past Surgical History  Procedure Laterality Date  . Carpal tunnel release Right 11/01/2014  . Arthroscopic knee Left 2-16  . Abdominal hysterectomy    . Tonsillectomy      Social History:  reports that she has been smoking.  She does not have any smokeless tobacco history on file. She reports that she drinks alcohol. She reports that she does not use illicit drugs.  Allergies:  Allergies  Allergen Reactions  . Penicillins Hives    Medications: No current facility-administered medications for this encounter.   Current Outpatient Prescriptions  Medication Sig Dispense Refill  . blood glucose meter kit and supplies KIT Dispense based on patient and insurance preference. Use up to four times daily as directed. (FOR ICD-9 250.00, 250.01). 1 each 0  . Dulaglutide (TRULICITY) 8.88 BV/6.9IH SOPN Inject 0.75mg  into the skin weekly (Patient taking differently: Inject 0.75mg  into the skin weekly on Wednesdays) 4 pen 11  . glipiZIDE (GLUCOTROL) 5 MG tablet Take 1 tablet (5 mg total) by mouth 2 (two) times  daily before a meal. 90 tablet 3  . losartan-hydrochlorothiazide (HYZAAR) 100-12.5 MG per tablet Take 1 tablet by mouth daily. 90 tablet 3  . metFORMIN (GLUCOPHAGE) 1000 MG tablet Take 1 tablet (1,000 mg total) by mouth 2 (two) times daily with a meal. 180 tablet 3  . Multiple Vitamin (MULTIVITAMIN WITH MINERALS) TABS tablet Take 1 tablet by mouth daily.    . naproxen sodium (ANAPROX) 220 MG tablet Take 220 mg by mouth 2 (two) times daily as needed (pain).    . sodium chloride (OCEAN) 0.65 % SOLN nasal spray Place 1 spray into both nostrils as needed for congestion.    . diclofenac (VOLTAREN) 75 MG EC tablet Take 1 tablet (75 mg total) by mouth 2 (two) times daily. (Patient not taking: Reported on 02/04/2015) 30 tablet 0  . pravastatin (PRAVACHOL) 20 MG tablet Take 1 tablet (20 mg total) by mouth daily. (Patient not taking: Reported on 02/04/2015) 90 tablet 3    Results for orders placed or performed during the hospital encounter of 02/07/15 (from the past 48 hour(s))  Surgical pcr screen     Status: None   Collection Time: 02/07/15 10:49 AM  Result Value Ref Range   MRSA, PCR NEGATIVE NEGATIVE   Staphylococcus aureus NEGATIVE NEGATIVE    Comment:        The Xpert SA Assay (FDA approved for NASAL specimens in patients over 44 years of age), is one component of a comprehensive surveillance program.  Test performance  has been validated by [facility] for patients greater than or equal to 18 year old. It is not intended to diagnose infection nor to guide or monitor treatment.   Basic metabolic panel     Status: Abnormal   Collection Time: 02/07/15 10:51 AM  Result Value Ref Range   Sodium 136 135 - 145 mmol/L   Potassium 3.7 3.5 - 5.1 mmol/L   Chloride 101 96 - 112 mmol/L   CO2 25 19 - 32 mmol/L   Glucose, Bld 101 (H) 70 - 99 mg/dL   BUN 14 6 - 23 mg/dL   Creatinine, Ser 1.02 0.50 - 1.10 mg/dL   Calcium 9.6 8.4 - 10.5 mg/dL   GFR calc non Af Amer 58 (L) >90 mL/min   GFR calc Af  Amer 67 (L) >90 mL/min    Comment: (NOTE) The eGFR has been calculated using the CKD EPI equation. This calculation has not been validated in all clinical situations. eGFR's persistently <90 mL/min signify possible Chronic Kidney Disease.    Anion gap 10 5 - 15  CBC     Status: Abnormal   Collection Time: 02/07/15 10:51 AM  Result Value Ref Range   WBC 9.1 4.0 - 10.5 K/uL   RBC 5.21 (H) 3.87 - 5.11 MIL/uL   Hemoglobin 10.0 (L) 12.0 - 15.0 g/dL    Comment: SPECIMEN CHECKED FOR CLOTS REPEATED TO VERIFY    HCT 32.3 (L) 36.0 - 46.0 %   MCV 62.0 (L) 78.0 - 100.0 fL   MCH 19.2 (L) 26.0 - 34.0 pg   MCHC 31.0 30.0 - 36.0 g/dL   RDW 16.0 (H) 11.5 - 15.5 %   Platelets 302 150 - 400 K/uL    Comment: SPECIMEN CHECKED FOR CLOTS REPEATED TO VERIFY   Type and screen     Status: None   Collection Time: 02/07/15 11:04 AM  Result Value Ref Range   ABO/RH(D) O POS    Antibody Screen NEG    Sample Expiration 02/21/2015    No results found.  ROS: Pain with rom of the right lower extremity  Physical Exam:  Alert and oriented 59 y.o. female in no acute distress Cranial nerves 2-12 intact Cervical spine: full rom with no tenderness, nv intact distally Chest: active breath sounds bilaterally, no wheeze rhonchi or rales Heart: regular rate and rhythm, no murmur Abd: non tender non distended with active bowel sounds Hip is stable with rom  Right knee with medial and lateral joint line tenderness nv intact distally No rashes or edema Antalgic gait  Assessment/Plan Assessment: right knee end stage osteoarthritis  Plan: Patient will undergo a right total knee arthroplasty by Dr. Veverly Fells at [facility]. Risks benefits and expectations were discussed with the patient. Patient understand risks, benefits and expectations and wishes to proceed.

## 2015-02-09 ENCOUNTER — Other Ambulatory Visit (HOSPITAL_COMMUNITY): Payer: Self-pay | Admitting: Ophthalmology

## 2015-02-09 DIAGNOSIS — H34231 Retinal artery branch occlusion, right eye: Secondary | ICD-10-CM

## 2015-02-10 ENCOUNTER — Encounter: Payer: Self-pay | Admitting: Family Medicine

## 2015-02-10 DIAGNOSIS — H3401 Transient retinal artery occlusion, right eye: Secondary | ICD-10-CM

## 2015-02-11 NOTE — Telephone Encounter (Signed)
Called Dr. Baird Cancer back:

## 2015-02-14 ENCOUNTER — Ambulatory Visit (HOSPITAL_COMMUNITY)
Admission: RE | Admit: 2015-02-14 | Discharge: 2015-02-14 | Disposition: A | Discharge status: Home / Self Care | Payer: BLUE CROSS/BLUE SHIELD | Source: Ambulatory Visit | Attending: Internal Medicine | Admitting: Internal Medicine

## 2015-02-14 DIAGNOSIS — I6523 Occlusion and stenosis of bilateral carotid arteries: Secondary | ICD-10-CM | POA: Insufficient documentation

## 2015-02-14 DIAGNOSIS — H34231 Retinal artery branch occlusion, right eye: Secondary | ICD-10-CM | POA: Diagnosis not present

## 2015-02-14 NOTE — Progress Notes (Signed)
VASCULAR LAB PRELIMINARY  PRELIMINARY  PRELIMINARY  PRELIMINARY  Carotid duplex  completed.    Preliminary report:  Bilateral:  1-39% ICA stenosis.  Vertebral artery flow is antegrade.      Lamark Schue, RVT 02/14/2015, 9:13 AM

## 2015-02-15 NOTE — Telephone Encounter (Signed)
Spoke with Dr. Baird Cancer.  She has a right eye arterial occlusion- she needs a carotid US. I will order this for the pt and will follow-up- if needed will refer to vascular surgery

## 2015-02-17 MED ORDER — CLINDAMYCIN PHOSPHATE 900 MG/50ML IV SOLN
900.0000 mg | INTRAVENOUS | Status: AC
  Administered 2015-02-18: 900 mg via INTRAVENOUS
  Filled 2015-02-17: qty 50

## 2015-02-17 MED ORDER — CHLORHEXIDINE GLUCONATE 4 % EX LIQD
60.0000 mL | Freq: Once | CUTANEOUS | Status: DC
  Filled 2015-02-17: qty 60

## 2015-02-17 NOTE — Progress Notes (Signed)
Anesthesia Chart Review:  Patient is a 59 year old female scheduled for right TKA on 02/18/15 by Dr. Veverly Fells.  History includes smoking, HTN, DM2, SOB, anemia. BMI is consistent with obesity.  PCP Dr.  Lorelei Pont medically cleared patient for surgery.  According to notes, her retinal specialist Dr. Baird Cancer recently found her to have right eye arterial occlusion.  Carotid duplex was done on 02/14/15 that showed 1-39% BICA stenosis, antegrade vertebral flow.   11/26/14 EKG: SR, low voltage.  Preoperative labs noted. H/H 10/32.3, stable since 11/26/14. T&S done. Will order PT/PTT on arrival since she is scheduled for TKA and not done at PAT. A1C 2/12/6 was 8.3.   I discussed retinal artery occlusion with finding of no significant carotid stenosis with anesthesiologist Dr. Tobias Alexander. If no acute changes then I anticipate that she can proceed as planned.  [facility]/Anesthesiology Phone [phone] 02/17/2015 5:40 PM

## 2015-02-18 ENCOUNTER — Inpatient Hospital Stay (HOSPITAL_COMMUNITY): Payer: BLUE CROSS/BLUE SHIELD | Admitting: Vascular Surgery

## 2015-02-18 ENCOUNTER — Inpatient Hospital Stay (HOSPITAL_COMMUNITY)
Admission: RE | Admit: 2015-02-18 | Discharge: 2015-02-21 | DRG: 470 | Disposition: A | Discharge status: Home Health | Payer: BLUE CROSS/BLUE SHIELD | Source: Ambulatory Visit | Attending: Orthopedic Surgery | Admitting: Orthopedic Surgery

## 2015-02-18 ENCOUNTER — Inpatient Hospital Stay (HOSPITAL_COMMUNITY): Payer: BLUE CROSS/BLUE SHIELD | Admitting: Certified Registered"

## 2015-02-18 ENCOUNTER — Encounter (HOSPITAL_COMMUNITY)
Admission: RE | Disposition: A | Discharge status: Home Health | Payer: Self-pay | Source: Ambulatory Visit | Attending: Orthopedic Surgery

## 2015-02-18 ENCOUNTER — Encounter (HOSPITAL_COMMUNITY): Payer: Self-pay | Admitting: Certified Registered"

## 2015-02-18 ENCOUNTER — Inpatient Hospital Stay (HOSPITAL_COMMUNITY): Payer: BLUE CROSS/BLUE SHIELD

## 2015-02-18 ENCOUNTER — Other Ambulatory Visit: Payer: Self-pay | Admitting: Family Medicine

## 2015-02-18 ENCOUNTER — Encounter: Payer: Self-pay | Admitting: Family Medicine

## 2015-02-18 DIAGNOSIS — I10 Essential (primary) hypertension: Secondary | ICD-10-CM | POA: Diagnosis present

## 2015-02-18 DIAGNOSIS — E119 Type 2 diabetes mellitus without complications: Secondary | ICD-10-CM | POA: Diagnosis present

## 2015-02-18 DIAGNOSIS — D62 Acute posthemorrhagic anemia: Secondary | ICD-10-CM | POA: Diagnosis not present

## 2015-02-18 DIAGNOSIS — Z96659 Presence of unspecified artificial knee joint: Secondary | ICD-10-CM

## 2015-02-18 DIAGNOSIS — H3401 Transient retinal artery occlusion, right eye: Secondary | ICD-10-CM

## 2015-02-18 DIAGNOSIS — G4730 Sleep apnea, unspecified: Secondary | ICD-10-CM | POA: Diagnosis present

## 2015-02-18 DIAGNOSIS — Z7901 Long term (current) use of anticoagulants: Secondary | ICD-10-CM | POA: Diagnosis not present

## 2015-02-18 DIAGNOSIS — Z880 Allergy status to penicillin: Secondary | ICD-10-CM | POA: Diagnosis not present

## 2015-02-18 DIAGNOSIS — Z79899 Other long term (current) drug therapy: Secondary | ICD-10-CM | POA: Diagnosis not present

## 2015-02-18 DIAGNOSIS — Z96651 Presence of right artificial knee joint: Secondary | ICD-10-CM

## 2015-02-18 DIAGNOSIS — M25561 Pain in right knee: Secondary | ICD-10-CM | POA: Diagnosis present

## 2015-02-18 DIAGNOSIS — M1711 Unilateral primary osteoarthritis, right knee: Principal | ICD-10-CM | POA: Diagnosis present

## 2015-02-18 HISTORY — PX: TOTAL KNEE ARTHROPLASTY: SHX125

## 2015-02-18 LAB — GLUCOSE, CAPILLARY
Glucose-Capillary: 119 mg/dL — ABNORMAL HIGH (ref 70–99)
Glucose-Capillary: 112 mg/dL — ABNORMAL HIGH (ref 70–99)
Glucose-Capillary: 128 mg/dL — ABNORMAL HIGH (ref 70–99)
Glucose-Capillary: 167 mg/dL — ABNORMAL HIGH (ref 70–99)

## 2015-02-18 LAB — PROTIME-INR
INR: 0.99 (ref 0.00–1.49)
Prothrombin Time: 13.2 seconds (ref 11.6–15.2)

## 2015-02-18 LAB — APTT: aPTT: 44 seconds — ABNORMAL HIGH (ref 24–37)

## 2015-02-18 SURGERY — ARTHROPLASTY, KNEE, TOTAL
Anesthesia: Regional | Site: Knee | Laterality: Right

## 2015-02-18 MED ORDER — INSULIN ASPART 100 UNIT/ML ~~LOC~~ SOLN: 0.0000 [IU] | Freq: Three times a day (TID) | SUBCUTANEOUS | Status: DC

## 2015-02-18 MED ORDER — SODIUM CHLORIDE 0.9 % IV SOLN
INTRAVENOUS | Status: DC
  Administered 2015-02-18 – 2015-02-19 (×2): via INTRAVENOUS

## 2015-02-18 MED ORDER — HYDROCHLOROTHIAZIDE 12.5 MG PO CAPS
12.5000 mg | ORAL_CAPSULE | Freq: Every day | ORAL | Status: DC
  Administered 2015-02-19 – 2015-02-21 (×3): 12.5 mg via ORAL
  Filled 2015-02-18 (×3): qty 1

## 2015-02-18 MED ORDER — INSULIN ASPART 100 UNIT/ML ~~LOC~~ SOLN: 6.0000 [IU] | Freq: Three times a day (TID) | SUBCUTANEOUS | Status: DC

## 2015-02-18 MED ORDER — PROPOFOL INFUSION 10 MG/ML OPTIME
INTRAVENOUS | Status: DC | PRN
  Administered 2015-02-18: 75 ug/kg/min via INTRAVENOUS

## 2015-02-18 MED ORDER — SALINE SPRAY 0.65 % NA SOLN
1.0000 | NASAL | Status: DC | PRN
  Filled 2015-02-18: qty 44

## 2015-02-18 MED ORDER — MIDAZOLAM HCL 5 MG/5ML IJ SOLN
INTRAMUSCULAR | Status: DC | PRN
  Administered 2015-02-18: 1 mg via INTRAVENOUS

## 2015-02-18 MED ORDER — ADULT MULTIVITAMIN W/MINERALS CH
1.0000 | ORAL_TABLET | Freq: Every day | ORAL | Status: DC
  Administered 2015-02-18 – 2015-02-21 (×4): 1 via ORAL
  Filled 2015-02-18 (×4): qty 1

## 2015-02-18 MED ORDER — METOCLOPRAMIDE HCL 5 MG PO TABS: 5.0000 mg | ORAL_TABLET | Freq: Three times a day (TID) | ORAL | Status: DC | PRN

## 2015-02-18 MED ORDER — OXYCODONE HCL 5 MG PO TABS
5.0000 mg | ORAL_TABLET | ORAL | Status: DC | PRN
  Administered 2015-02-18 – 2015-02-20 (×12): 10 mg via ORAL
  Administered 2015-02-21: 5 mg via ORAL
  Administered 2015-02-21: 10 mg via ORAL
  Administered 2015-02-21: 5 mg via ORAL
  Filled 2015-02-18 (×15): qty 2

## 2015-02-18 MED ORDER — LIDOCAINE HCL (CARDIAC) 20 MG/ML IV SOLN
INTRAVENOUS | Status: AC
  Filled 2015-02-18: qty 5

## 2015-02-18 MED ORDER — WARFARIN - PHARMACIST DOSING INPATIENT: Freq: Every day | Status: DC

## 2015-02-18 MED ORDER — METOCLOPRAMIDE HCL 5 MG/ML IJ SOLN
5.0000 mg | Freq: Three times a day (TID) | INTRAMUSCULAR | Status: DC | PRN
  Administered 2015-02-18: 10 mg via INTRAVENOUS
  Filled 2015-02-18: qty 2

## 2015-02-18 MED ORDER — SODIUM CHLORIDE 0.9 % IR SOLN
Status: DC | PRN
  Administered 2015-02-18 (×2): 1000 mL

## 2015-02-18 MED ORDER — METHOCARBAMOL 1000 MG/10ML IJ SOLN
500.0000 mg | Freq: Four times a day (QID) | INTRAVENOUS | Status: DC | PRN
  Filled 2015-02-18: qty 5

## 2015-02-18 MED ORDER — LOSARTAN POTASSIUM 50 MG PO TABS
100.0000 mg | ORAL_TABLET | Freq: Every day | ORAL | Status: DC
  Administered 2015-02-19 – 2015-02-21 (×3): 100 mg via ORAL
  Filled 2015-02-18 (×3): qty 2

## 2015-02-18 MED ORDER — MIDAZOLAM HCL 2 MG/2ML IJ SOLN
INTRAMUSCULAR | Status: AC
  Filled 2015-02-18: qty 2

## 2015-02-18 MED ORDER — BUPIVACAINE-EPINEPHRINE (PF) 0.5% -1:200000 IJ SOLN
INTRAMUSCULAR | Status: DC | PRN
  Administered 2015-02-18: 30 mL via PERINEURAL

## 2015-02-18 MED ORDER — FERROUS SULFATE 325 (65 FE) MG PO TABS
325.0000 mg | ORAL_TABLET | Freq: Three times a day (TID) | ORAL | Status: DC
  Administered 2015-02-18 – 2015-02-21 (×7): 325 mg via ORAL
  Filled 2015-02-18 (×7): qty 1

## 2015-02-18 MED ORDER — INSULIN ASPART 100 UNIT/ML ~~LOC~~ SOLN
0.0000 [IU] | Freq: Every day | SUBCUTANEOUS | Status: DC
Start: 2015-02-18 — End: 2015-02-21

## 2015-02-18 MED ORDER — WARFARIN VIDEO: Freq: Once | Status: DC

## 2015-02-18 MED ORDER — PHENOL 1.4 % MT LIQD
1.0000 | OROMUCOSAL | Status: DC | PRN
Start: 2015-02-18 — End: 2015-02-21

## 2015-02-18 MED ORDER — METHOCARBAMOL 1000 MG/10ML IJ SOLN
500.0000 mg | INTRAVENOUS | Status: DC
  Filled 2015-02-18: qty 5

## 2015-02-18 MED ORDER — LOSARTAN POTASSIUM-HCTZ 100-12.5 MG PO TABS: 1.0000 | ORAL_TABLET | Freq: Every day | ORAL | Status: DC

## 2015-02-18 MED ORDER — HYDROMORPHONE HCL 1 MG/ML IJ SOLN
1.0000 mg | INTRAMUSCULAR | Status: DC | PRN
  Administered 2015-02-18 – 2015-02-19 (×3): 1 mg via INTRAVENOUS
  Filled 2015-02-18 (×3): qty 1

## 2015-02-18 MED ORDER — METFORMIN HCL 500 MG PO TABS
1000.0000 mg | ORAL_TABLET | Freq: Two times a day (BID) | ORAL | Status: DC
  Administered 2015-02-18 – 2015-02-21 (×6): 1000 mg via ORAL
  Filled 2015-02-18 (×7): qty 2

## 2015-02-18 MED ORDER — PHENYLEPHRINE HCL 10 MG/ML IJ SOLN
10.0000 mg | INTRAMUSCULAR | Status: DC | PRN
  Administered 2015-02-18: 60 ug/min via INTRAVENOUS

## 2015-02-18 MED ORDER — CLINDAMYCIN PHOSPHATE 600 MG/50ML IV SOLN
600.0000 mg | Freq: Four times a day (QID) | INTRAVENOUS | Status: AC
  Administered 2015-02-18 (×2): 600 mg via INTRAVENOUS
  Filled 2015-02-18 (×2): qty 50

## 2015-02-18 MED ORDER — METHOCARBAMOL 500 MG PO TABS
500.0000 mg | ORAL_TABLET | Freq: Four times a day (QID) | ORAL | Status: DC | PRN
  Administered 2015-02-19 – 2015-02-20 (×4): 500 mg via ORAL
  Filled 2015-02-18 (×4): qty 1

## 2015-02-18 MED ORDER — METFORMIN HCL 500 MG PO TABS: 1000.0000 mg | ORAL_TABLET | Freq: Two times a day (BID) | ORAL | Status: DC

## 2015-02-18 MED ORDER — MIDAZOLAM HCL 2 MG/2ML IJ SOLN
1.0000 mg | INTRAMUSCULAR | Status: DC | PRN
Start: 2015-02-18 — End: 2015-02-18
  Administered 2015-02-18: 1 mg via INTRAVENOUS

## 2015-02-18 MED ORDER — LACTATED RINGERS IV SOLN
INTRAVENOUS | Status: DC | PRN
  Administered 2015-02-18 (×2): via INTRAVENOUS

## 2015-02-18 MED ORDER — ACETAMINOPHEN 325 MG PO TABS
650.0000 mg | ORAL_TABLET | Freq: Four times a day (QID) | ORAL | Status: DC | PRN
  Administered 2015-02-19 – 2015-02-20 (×2): 650 mg via ORAL
  Filled 2015-02-18 (×2): qty 2

## 2015-02-18 MED ORDER — WARFARIN SODIUM 7.5 MG PO TABS
7.5000 mg | ORAL_TABLET | Freq: Once | ORAL | Status: AC
  Administered 2015-02-18: 7.5 mg via ORAL
  Filled 2015-02-18: qty 1

## 2015-02-18 MED ORDER — PRAVASTATIN SODIUM 20 MG PO TABS
20.0000 mg | ORAL_TABLET | Freq: Every day | ORAL | Status: DC
  Administered 2015-02-19 – 2015-02-20 (×2): 20 mg via ORAL
  Filled 2015-02-18 (×3): qty 1

## 2015-02-18 MED ORDER — GLIPIZIDE 5 MG PO TABS
5.0000 mg | ORAL_TABLET | Freq: Two times a day (BID) | ORAL | Status: DC
  Administered 2015-02-18 – 2015-02-21 (×6): 5 mg via ORAL
  Filled 2015-02-18 (×6): qty 1

## 2015-02-18 MED ORDER — ONDANSETRON HCL 4 MG PO TABS: 4.0000 mg | ORAL_TABLET | Freq: Four times a day (QID) | ORAL | Status: DC | PRN

## 2015-02-18 MED ORDER — PATIENT'S GUIDE TO USING COUMADIN BOOK
Freq: Once | Status: DC
  Filled 2015-02-18: qty 1

## 2015-02-18 MED ORDER — FENTANYL CITRATE (PF) 100 MCG/2ML IJ SOLN
50.0000 ug | INTRAMUSCULAR | Status: DC | PRN
  Administered 2015-02-18: 50 ug via INTRAVENOUS

## 2015-02-18 MED ORDER — FENTANYL CITRATE (PF) 100 MCG/2ML IJ SOLN
INTRAMUSCULAR | Status: AC
  Administered 2015-02-18: 50 ug via INTRAVENOUS
  Filled 2015-02-18: qty 2

## 2015-02-18 MED ORDER — ONDANSETRON HCL 4 MG/2ML IJ SOLN
4.0000 mg | Freq: Four times a day (QID) | INTRAMUSCULAR | Status: DC | PRN
  Administered 2015-02-18: 4 mg via INTRAVENOUS
  Filled 2015-02-18: qty 2

## 2015-02-18 MED ORDER — PHENYLEPHRINE HCL 10 MG/ML IJ SOLN
INTRAMUSCULAR | Status: DC | PRN
  Administered 2015-02-18: 80 ug via INTRAVENOUS
  Administered 2015-02-18 (×3): 40 ug via INTRAVENOUS
  Administered 2015-02-18 (×2): 80 ug via INTRAVENOUS

## 2015-02-18 MED ORDER — MENTHOL 3 MG MT LOZG: 1.0000 | LOZENGE | OROMUCOSAL | Status: DC | PRN

## 2015-02-18 MED ORDER — BISACODYL 10 MG RE SUPP: 10.0000 mg | Freq: Every day | RECTAL | Status: DC | PRN

## 2015-02-18 MED ORDER — POLYETHYLENE GLYCOL 3350 17 G PO PACK: 17.0000 g | PACK | Freq: Every day | ORAL | Status: DC | PRN

## 2015-02-18 MED ORDER — ACETAMINOPHEN 650 MG RE SUPP: 650.0000 mg | Freq: Four times a day (QID) | RECTAL | Status: DC | PRN

## 2015-02-18 MED ORDER — MIDAZOLAM HCL 2 MG/2ML IJ SOLN
INTRAMUSCULAR | Status: AC
  Administered 2015-02-18: 1 mg via INTRAVENOUS
  Filled 2015-02-18: qty 2

## 2015-02-18 SURGICAL SUPPLY — 64 items
BANDAGE ELASTIC 6 VELCRO ST LF (GAUZE/BANDAGES/DRESSINGS) ×1 IMPLANT
BANDAGE ESMARK 6X9 LF (GAUZE/BANDAGES/DRESSINGS) ×1 IMPLANT
BLADE SAG 18X100X1.27 (BLADE) ×2 IMPLANT
BLADE SAW SGTL 13.0X1.19X90.0M (BLADE) ×2 IMPLANT
BNDG CMPR 9X6 STRL LF SNTH (GAUZE/BANDAGES/DRESSINGS) ×1
BNDG CMPR MED 10X6 ELC LF (GAUZE/BANDAGES/DRESSINGS)
BNDG ELASTIC 6X10 VLCR STRL LF (GAUZE/BANDAGES/DRESSINGS) ×1 IMPLANT
BNDG ESMARK 6X9 LF (GAUZE/BANDAGES/DRESSINGS) ×2
BNDG GAUZE ELAST 4 BULKY (GAUZE/BANDAGES/DRESSINGS) ×4 IMPLANT
BOWL SMART MIX CTS (DISPOSABLE) ×2 IMPLANT
CAP KNEE TOTAL 3 SIGMA ×1 IMPLANT
CEMENT HV SMART SET (Cement) ×4 IMPLANT
COVER SURGICAL LIGHT HANDLE (MISCELLANEOUS) ×2 IMPLANT
CUFF TOURNIQUET SINGLE 34IN LL (TOURNIQUET CUFF) ×1 IMPLANT
CUFF TOURNIQUET SINGLE 44IN (TOURNIQUET CUFF) IMPLANT
DRAPE EXTREMITY T 121X128X90 (DRAPE) ×2 IMPLANT
DRAPE IMP U-DRAPE 54X76 (DRAPES) ×2 IMPLANT
DRAPE PROXIMA HALF (DRAPES) ×2 IMPLANT
DRAPE U-SHAPE 47X51 STRL (DRAPES) ×2 IMPLANT
DRSG ADAPTIC 3X8 NADH LF (GAUZE/BANDAGES/DRESSINGS) ×2 IMPLANT
DRSG PAD ABDOMINAL 8X10 ST (GAUZE/BANDAGES/DRESSINGS) ×2 IMPLANT
DURAPREP 26ML APPLICATOR (WOUND CARE) ×2 IMPLANT
ELECT CAUTERY BLADE 6.4 (BLADE) ×2 IMPLANT
ELECT REM PT RETURN 9FT ADLT (ELECTROSURGICAL) ×2
ELECTRODE REM PT RTRN 9FT ADLT (ELECTROSURGICAL) ×1 IMPLANT
GAUZE SPONGE 4X4 12PLY STRL (GAUZE/BANDAGES/DRESSINGS) ×2 IMPLANT
GLOVE BIO SURGEON STRL SZ8.5 (GLOVE) ×3 IMPLANT
GLOVE BIOGEL PI IND STRL 8.5 (GLOVE) IMPLANT
GLOVE BIOGEL PI INDICATOR 8.5 (GLOVE) ×1
GLOVE BIOGEL PI ORTHO PRO 7.5 (GLOVE) ×1
GLOVE BIOGEL PI ORTHO PRO SZ8 (GLOVE) ×1
GLOVE ORTHO TXT STRL SZ7.5 (GLOVE) ×2 IMPLANT
GLOVE PI ORTHO PRO STRL 7.5 (GLOVE) ×1 IMPLANT
GLOVE PI ORTHO PRO STRL SZ8 (GLOVE) ×1 IMPLANT
GLOVE SURG ORTHO 8.5 STRL (GLOVE) ×2 IMPLANT
GOWN STRL REUS W/ TWL XL LVL3 (GOWN DISPOSABLE) ×3 IMPLANT
GOWN STRL REUS W/TWL XL LVL3 (GOWN DISPOSABLE) ×8
HANDPIECE INTERPULSE COAX TIP (DISPOSABLE) ×2
IMMOBILIZER KNEE 22 UNIV (SOFTGOODS) ×1 IMPLANT
KIT BASIN OR (CUSTOM PROCEDURE TRAY) ×2 IMPLANT
KIT MANIFOLD (MISCELLANEOUS) ×2 IMPLANT
KIT ROOM TURNOVER OR (KITS) ×2 IMPLANT
MANIFOLD NEPTUNE II (INSTRUMENTS) ×2 IMPLANT
MARKER SKIN DUAL TIP RULER LAB (MISCELLANEOUS) ×1 IMPLANT
NS IRRIG 1000ML POUR BTL (IV SOLUTION) ×2 IMPLANT
PACK TOTAL JOINT (CUSTOM PROCEDURE TRAY) ×2 IMPLANT
PACK UNIVERSAL I (CUSTOM PROCEDURE TRAY) ×2 IMPLANT
PAD ARMBOARD 7.5X6 YLW CONV (MISCELLANEOUS) ×4 IMPLANT
SET HNDPC FAN SPRY TIP SCT (DISPOSABLE) ×1 IMPLANT
STRIP CLOSURE SKIN 1/2X4 (GAUZE/BANDAGES/DRESSINGS) ×4 IMPLANT
SUCTION FRAZIER TIP 10 FR DISP (SUCTIONS) ×3 IMPLANT
SUT MNCRL AB 3-0 PS2 18 (SUTURE) ×2 IMPLANT
SUT VIC AB 0 CT1 27 (SUTURE) ×4
SUT VIC AB 0 CT1 27XBRD ANBCTR (SUTURE) ×2 IMPLANT
SUT VIC AB 1 CT1 27 (SUTURE) ×6
SUT VIC AB 1 CT1 27XBRD ANBCTR (SUTURE) ×3 IMPLANT
SUT VIC AB 2-0 CT1 27 (SUTURE) ×4
SUT VIC AB 2-0 CT1 TAPERPNT 27 (SUTURE) ×2 IMPLANT
TOWEL OR 17X24 6PK STRL BLUE (TOWEL DISPOSABLE) ×2 IMPLANT
TOWEL OR 17X26 10 PK STRL BLUE (TOWEL DISPOSABLE) ×2 IMPLANT
TRAY FOLEY CATH 16FRSI W/METER (SET/KITS/TRAYS/PACK) ×1 IMPLANT
TUBING BULK SUCTION (MISCELLANEOUS) ×1 IMPLANT
WATER STERILE IRR 1000ML POUR (IV SOLUTION) ×2 IMPLANT
YANKAUER SUCT BULB TIP NO VENT (SUCTIONS) ×1 IMPLANT

## 2015-02-18 NOTE — Anesthesia Preprocedure Evaluation (Signed)
Anesthesia Evaluation  Patient identified by MRN, date of birth, ID band Patient awake    Reviewed: Allergy & Precautions, NPO status , Patient's Chart, lab work & pertinent test results  Airway Mallampati: II   Neck ROM: full    Dental   Pulmonary shortness of breath, sleep apnea , Current Smoker,  breath sounds clear to auscultation        Cardiovascular hypertension, Rhythm:regular Rate:Normal     Neuro/Psych    GI/Hepatic   Endo/Other  diabetes, Type 2Morbid obesity  Renal/GU      Musculoskeletal  (+) Arthritis -,   Abdominal   Peds  Hematology   Anesthesia Other Findings   Reproductive/Obstetrics                             Anesthesia Physical Anesthesia Plan  ASA: II  Anesthesia Plan: Regional and Spinal   Post-op Pain Management: MAC Combined w/ Regional for Post-op pain   Induction: Intravenous  Airway Management Planned: Simple Face Mask  Additional Equipment:   Intra-op Plan:   Post-operative Plan:   Informed Consent: I have reviewed the patients History and Physical, chart, labs and discussed the procedure including the risks, benefits and alternatives for the proposed anesthesia with the patient or authorized representative who has indicated his/her understanding and acceptance.     Plan Discussed with: CRNA, Anesthesiologist and Surgeon  Anesthesia Plan Comments:         Anesthesia Quick Evaluation

## 2015-02-18 NOTE — Evaluation (Signed)
Physical Therapy Evaluation Patient Details Name: Lynn [name] MRN: [id number] DOB: 1956/04/12 Today's Date: 02/18/2015   History of Present Illness  Patient is a 59 y/o female s/p R TKA. PMH of DM, HTN, apnea and anemia.  Clinical Impression  Patient presents with lethargy and post surgical deficits of RLE s/p above surgery. Education provided on knee precautions and HEP. Mobility assessment limited secondary to lethargy and difficulty keeping eyes opened. Tolerated standing and performing pre-gait mini marches without knee buckling. Pt would benefit from [facility] SNF to improve gait, balance and mobility so pt can maximize independence prior to return home. Pt has 1 flight of steps to negotiate to get to bedroom/bathroom.    Follow Up Recommendations SNF;Supervision/Assistance - 24 hour    Equipment Recommendations  None recommended by PT    Recommendations for Other Services       Precautions / Restrictions Precautions Precautions: Knee Precaution Booklet Issued: No Precaution Comments: Reviewed not placing pillow under knee and HEP. Required Braces or Orthoses: Knee Immobilizer - Right Knee Immobilizer - Right: Other (comment) (in bed except when in CPM.) Restrictions Weight Bearing Restrictions: Yes RLE Weight Bearing: Weight bearing as tolerated      Mobility  Bed Mobility Overal bed mobility: Needs Assistance Bed Mobility: Supine to Sit;Sit to Supine     Supine to sit: Min guard Sit to supine: Min guard;HOB elevated   General bed mobility comments: Use of rails for support. No physical assist needed.  Transfers Overall transfer level: Needs assistance Equipment used: Rolling walker (2 wheeled) Transfers: Sit to/from Stand Sit to Stand: Min guard         General transfer comment: Min guard for safety due to lethargy. No knee buckling. Able to perform marching in standing. + dizziness.  Ambulation/Gait Ambulation/Gait assistance:  (NA secondary to lethargy.)               Stairs            Wheelchair Mobility    Modified Rankin (Stroke Patients Only)       Balance Overall balance assessment: Needs assistance Sitting-balance support: Feet supported;No upper extremity supported Sitting balance-Leahy Scale: Fair     Standing balance support: During functional activity Standing balance-Leahy Scale: Poor                               Pertinent Vitals/Pain Pain Assessment: No/denies pain    Home Living Family/patient expects to be discharged to:: Skilled nursing facility Living Arrangements: Spouse/significant other   Type of Home: House Home Access: Stairs to enter Entrance Stairs-Rails: Right Entrance Stairs-Number of Steps: 6 Home Layout: Two level Home Equipment: Walker - 2 wheels;Cane - single point      Prior Function Level of Independence: Independent with assistive device(s)         Comments: Pt using SPC vs RW PTA. Has Cipap. Drives. Does IADLs PTA.     Hand Dominance        Extremity/Trunk Assessment   Upper Extremity Assessment: Defer to OT evaluation           Lower Extremity Assessment: RLE deficits/detail RLE Deficits / Details: Able to perform QS but not LAQ. Limited AROM throughout hip/knee. Ankle AROM WFL.       Communication   Communication: No difficulties  Cognition Arousal/Alertness: Lethargic;Suspect due to medications Behavior During Therapy: [facility] for tasks assessed/performed Overall Cognitive Status: Within Functional Limits for tasks assessed  General Comments General comments (skin integrity, edema, etc.): Husband present in room during PT evaluation.    Exercises Total Joint Exercises Ankle Circles/Pumps: Both;15 reps;Seated Quad Sets: Right;5 reps;Seated Gluteal Sets: Both;5 reps;Seated Long Arc Quad: Right;10 reps;Seated;AAROM      Assessment/Plan    PT Assessment Patient needs continued PT services  PT Diagnosis  Generalized weakness;Difficulty walking   PT Problem List Decreased strength;Decreased range of motion;Impaired sensation;Decreased activity tolerance;Decreased balance;Decreased mobility  PT Treatment Interventions Balance training;Gait training;Patient/family education;Functional mobility training;Therapeutic activities;Therapeutic exercise;Stair training   PT Goals (Current goals can be found in the Care Plan section) Acute Rehab PT Goals Patient Stated Goal: to go to rehab PT Goal Formulation: With patient/family Time For Goal Achievement: 03/04/15 Potential to Achieve Goals: Good    Frequency 7X/week (BID if time allows)   Barriers to discharge        Co-evaluation               End of Session Equipment Utilized During Treatment: Gait belt Activity Tolerance: Patient limited by fatigue;Patient tolerated treatment well Patient left: in bed;with call bell/phone within reach;with bed alarm set;with family/visitor present Nurse Communication: Mobility status         Time: 1530-1550 PT Time Calculation (min) (ACUTE ONLY): 20 min   Charges:   PT Evaluation $Initial PT Evaluation Tier I: 1 Procedure     PT G CodesCandy Sledge A 02-19-2015, 3:59 PM Wray Kearns, Venus, DPT [phone]

## 2015-02-18 NOTE — Plan of Care (Signed)
Problem: Consults Goal: Diagnosis- Total Joint Replacement Primary Total Knee Right     

## 2015-02-18 NOTE — Progress Notes (Signed)
Orthopedic Tech Progress Note Patient Details:  Lynn [name] 06/06/1956 [id number] Off cpm Patient ID: [name], female   DOB: 1956-08-07, 59 y.o.   MRN: [id number]   Braulio Bosch 02/18/2015, 6:58 PM

## 2015-02-18 NOTE — Progress Notes (Signed)
ANTICOAGULATION CONSULT NOTE - Initial Consult  Pharmacy Consult for warfarin  Indication: VTE prophylaxis  Allergies  Allergen Reactions  . Penicillins Hives    Patient Measurements: Height: 5\' 3"  (160 cm) Weight: 226 lb (102.513 kg) IBW/kg (Calculated) : 52.4   Vital Signs: Temp: 97.7 F (36.5 C) (05/06 1438) Temp Source: Oral (05/06 0842) BP: 111/60 mmHg (05/06 1438) Pulse Rate: 69 (05/06 1438)  Labs:  Recent Labs  02/18/15 0927  APTT 44*  LABPROT 13.2  INR 0.99    Estimated Creatinine Clearance: 66.2 mL/min (by C-G formula based on Cr of 1.02).   Medical History: Past Medical History  Diagnosis Date  . Arthritis   . Diabetes mellitus without complication   . Ulcer   . Hypertension   . Sleep apnea     use CPAP  . Shortness of breath dyspnea     exertion  . Anemia     thalasemia    Assessment: 59 y.o. female complaining of right knee pain for multiple years. Pain had continually increased since the beginning. X-rays in the clinic show end-stage arthritic changes of the right knee.  S/p right total knee arthroplasty to start warfarin for VTE prophylaxis post-op. Baseline INR normal 0.99.  Warfarin point score of 4 based on age/wt/gender.  Goal of Therapy:  INR 2-3 Monitor platelets by anticoagulation protocol: Yes   Plan:  Warfarin 7.5mg  tonight Daily INR  Erin Hearing PharmD., BCPS Clinical Pharmacist Pager [phone] 02/18/2015 3:06 PM

## 2015-02-18 NOTE — Interval H&P Note (Signed)
History and Physical Interval Note:  02/18/2015 10:23 AM  Lynn [name]  has presented today for surgery, with the diagnosis of right knee osteoarthritis  The various methods of treatment have been discussed with the patient and family. After consideration of risks, benefits and other options for treatment, the patient has consented to  Procedure(s): RIGHT TOTAL KNEE ARTHROPLASTY (Right) as a surgical intervention .  The patient's history has been reviewed, patient examined, no change in status, stable for surgery.  I have reviewed the patient's chart and labs.  Questions were answered to the patient's satisfaction.     Pollyanna Levay,STEVEN R

## 2015-02-18 NOTE — Transfer of Care (Signed)
Immediate Anesthesia Transfer of Care Note  Patient: Lynn [name]  Procedure(s) Performed: Procedure(s): RIGHT TOTAL KNEE ARTHROPLASTY (Right)  Patient Location: PACU  Anesthesia Type:Spinal and MAC combined with regional for post-op pain  Level of Consciousness: alert  and oriented  Airway & Oxygen Therapy: Patient Spontanous Breathing and Patient connected to nasal cannula oxygen  Post-op Assessment: Report given to RN, Post -op Vital signs reviewed and stable and Patient moving all extremities  Post vital signs: Reviewed and stable  Last Vitals:  Filed Vitals:   02/18/15 1025  BP: 116/54  Pulse: 80  Temp:   Resp: 25    Complications: No apparent anesthesia complications

## 2015-02-18 NOTE — Brief Op Note (Signed)
02/18/2015  1:04 PM  PATIENT:  Lynn [name]  59 y.o. female  PRE-OPERATIVE DIAGNOSIS:  right knee osteoarthritis, end stage  POST-OPERATIVE DIAGNOSIS:  right knee osteoarthritis, end stage  PROCEDURE:  Procedure(s): RIGHT TOTAL KNEE ARTHROPLASTY (Right), DePuy Sigma RP  SURGEON:  Surgeon(s) and Role:    * Netta Cedars, MD - Primary  PHYSICIAN ASSISTANT:   ASSISTANTS: Ventura Bruns, PA-C   ANESTHESIA:   regional and spinal  EBL:  Total I/O In: 1500 [I.V.:1500] Out: 325 [Urine:225; Blood:100]  BLOOD ADMINISTERED:none  DRAINS: none   LOCAL MEDICATIONS USED:  NONE  SPECIMEN:  No Specimen  DISPOSITION OF SPECIMEN:  N/A  COUNTS:  YES  TOURNIQUET:   Total Tourniquet Time Documented: Thigh (Right) - 94 minutes Total: Thigh (Right) - 94 minutes   DICTATION: .Other Dictation: Dictation Number [id number]  PLAN OF CARE: Admit to inpatient   PATIENT DISPOSITION:  PACU - hemodynamically stable.   Delay start of Pharmacological VTE agent (>24hrs) due to surgical blood loss or risk of bleeding: no

## 2015-02-18 NOTE — Anesthesia Procedure Notes (Addendum)
Anesthesia Regional Block:  Femoral nerve block  Pre-Anesthetic Checklist: ,, timeout performed, Correct Patient, Correct Site, Correct Laterality, Correct Procedure,, site marked, risks and benefits discussed, Surgical consent,  Pre-op evaluation,  At surgeon's request and post-op pain management  Laterality: Right  Prep: chloraprep       Needles:  Injection technique: Single-shot  Needle Type: Echogenic Stimulator Needle     Needle Length: 9cm 9 cm Needle Gauge: 21 and 21 G    Additional Needles:  Procedures: nerve stimulator Femoral nerve block  Nerve Stimulator or Paresthesia:  Response: Quadriceps muscle contraction, 0.45 mA,   Additional Responses:   Narrative:  Start time: 02/18/2015 10:11 AM End time: 02/18/2015 10:21 AM Injection made incrementally with aspirations every 5 mL.  Performed by: Personally  Anesthesiologist: HODIERNE, ADAM  Additional Notes: Functioning IV was confirmed and monitors were applied.  A 40mm 21ga Arrow echogenic stimulator needle was used. Sterile prep and drape,hand hygiene and sterile gloves were used.  Negative aspiration and negative test dose prior to incremental administration of local anesthetic. The patient tolerated the procedure well.     Spinal Patient location during procedure: OR Start time: 02/18/2015 10:55 AM End time: 02/18/2015 10:58 AM Staffing Anesthesiologist: HODIERNE, ADAM Performed by: anesthesiologist  Preanesthetic Checklist Completed: patient identified, site marked, surgical consent, pre-op evaluation, timeout performed, IV checked, risks and benefits discussed and monitors and equipment checked Spinal Block Patient position: sitting Prep: Betadine Patient monitoring: heart rate, cardiac monitor, continuous pulse ox and blood pressure Approach: midline Location: L3-4 Injection technique: single-shot Needle Needle type: Pencan  Needle gauge: 24 G Needle length: 10 cm Assessment Sensory level:  T8 Additional Notes Pt tolerated the procedure well.  Procedure Name: MAC Date/Time: 02/18/2015 10:55 AM Performed by: Melina Copa, Zackrey Dyar R Pre-anesthesia Checklist: Patient identified, Emergency Drugs available, Suction available, Patient being monitored and Timeout performed Patient Re-evaluated:Patient Re-evaluated prior to inductionOxygen Delivery Method: Nasal cannula Placement Confirmation: positive ETCO2 Dental Injury: Teeth and Oropharynx as per pre-operative assessment

## 2015-02-18 NOTE — Progress Notes (Signed)
Orthopedic Tech Progress Note Patient Details:  Lynn [name] September 23, 1956 [id number]  CPM Right Knee CPM Right Knee: On Right Knee Flexion (Degrees): 90 Right Knee Extension (Degrees): 0 Additional Comments: trapeze bar patient helper Viewed order from doctor's order list  Hildred Priest 02/18/2015, 1:55 PM

## 2015-02-18 NOTE — Anesthesia Postprocedure Evaluation (Signed)
  Anesthesia Post-op Note  Patient: Lynn [name]  Procedure(s) Performed: Procedure(s): RIGHT TOTAL KNEE ARTHROPLASTY (Right)  Patient Location: PACU  Anesthesia Type:Spinal  Level of Consciousness: awake, alert  and oriented  Airway and Oxygen Therapy: Patient Spontanous Breathing  Post-op Pain: none  Post-op Assessment: Post-op Vital signs reviewed, Patient's Cardiovascular Status Stable and Respiratory Function Stable  Post-op Vital Signs: Reviewed and stable  Last Vitals:  Filed Vitals:   02/18/15 1333  BP: 103/65  Pulse: 71  Temp:   Resp: 16    Complications: No apparent anesthesia complications

## 2015-02-19 LAB — CBC
HCT: 26.7 % — ABNORMAL LOW (ref 36.0–46.0)
Hemoglobin: 8.3 g/dL — ABNORMAL LOW (ref 12.0–15.0)
MCH: 19.1 pg — ABNORMAL LOW (ref 26.0–34.0)
MCHC: 31.1 g/dL (ref 30.0–36.0)
MCV: 61.5 fL — ABNORMAL LOW (ref 78.0–100.0)
Platelets: 261 10*3/uL (ref 150–400)
RBC: 4.34 MIL/uL (ref 3.87–5.11)
RDW: 15.8 % — ABNORMAL HIGH (ref 11.5–15.5)
WBC: 9.2 10*3/uL (ref 4.0–10.5)

## 2015-02-19 LAB — BASIC METABOLIC PANEL
Anion gap: 9 (ref 5–15)
BUN: 14 mg/dL (ref 6–20)
CO2: 24 mmol/L (ref 22–32)
Calcium: 8.6 mg/dL — ABNORMAL LOW (ref 8.9–10.3)
Chloride: 100 mmol/L — ABNORMAL LOW (ref 101–111)
Creatinine, Ser: 1.1 mg/dL — ABNORMAL HIGH (ref 0.44–1.00)
GFR calc Af Amer: >60 mL/min (ref >60)
GFR calc non Af Amer: 53 mL/min — ABNORMAL LOW (ref >60)
Glucose, Bld: 154 mg/dL — ABNORMAL HIGH (ref 70–99)
Potassium: 4.4 mmol/L (ref 3.5–5.1)
Sodium: 133 mmol/L — ABNORMAL LOW (ref 135–145)

## 2015-02-19 LAB — GLUCOSE, CAPILLARY
Glucose-Capillary: 140 mg/dL — ABNORMAL HIGH (ref 70–99)
Glucose-Capillary: 113 mg/dL — ABNORMAL HIGH (ref 70–99)
Glucose-Capillary: 127 mg/dL — ABNORMAL HIGH (ref 70–99)
Glucose-Capillary: 138 mg/dL — ABNORMAL HIGH (ref 70–99)

## 2015-02-19 LAB — PROTIME-INR
INR: 1.07 (ref 0.00–1.49)
Prothrombin Time: 14 seconds (ref 11.6–15.2)

## 2015-02-19 MED ORDER — ENOXAPARIN SODIUM 30 MG/0.3ML ~~LOC~~ SOLN
30.0000 mg | Freq: Two times a day (BID) | SUBCUTANEOUS | Status: DC
  Administered 2015-02-19 – 2015-02-21 (×5): 30 mg via SUBCUTANEOUS
  Filled 2015-02-19 (×5): qty 0.3

## 2015-02-19 MED ORDER — MORPHINE SULFATE 2 MG/ML IJ SOLN
2.0000 mg | INTRAMUSCULAR | Status: DC | PRN
  Administered 2015-02-19 (×3): 2 mg via INTRAVENOUS
  Filled 2015-02-19 (×3): qty 1

## 2015-02-19 MED ORDER — WARFARIN SODIUM 7.5 MG PO TABS
7.5000 mg | ORAL_TABLET | Freq: Once | ORAL | Status: AC
  Administered 2015-02-19: 7.5 mg via ORAL
  Filled 2015-02-19: qty 1

## 2015-02-19 MED ORDER — OXYCODONE-ACETAMINOPHEN 5-325 MG PO TABS: 1.0000 | ORAL_TABLET | ORAL | Status: DC | PRN

## 2015-02-19 MED ORDER — WARFARIN SODIUM 5 MG PO TABS: 5.0000 mg | ORAL_TABLET | Freq: Every day | ORAL | Status: DC

## 2015-02-19 MED ORDER — METHOCARBAMOL 500 MG PO TABS: 500.0000 mg | ORAL_TABLET | Freq: Three times a day (TID) | ORAL | Status: DC | PRN

## 2015-02-19 MED ORDER — PROMETHAZINE HCL 25 MG/ML IJ SOLN
12.5000 mg | Freq: Four times a day (QID) | INTRAMUSCULAR | Status: DC | PRN
  Administered 2015-02-19 – 2015-02-20 (×3): 25 mg via INTRAVENOUS
  Filled 2015-02-19 (×3): qty 1

## 2015-02-19 NOTE — Progress Notes (Signed)
[place] for warfarin  Indication: VTE prophylaxis  Allergies  Allergen Reactions  . Penicillins Hives    Patient Measurements: Height: 5\' 3"  (160 cm) Weight: 226 lb (102.513 kg) IBW/kg (Calculated) : 52.4   Vital Signs: Temp: 99 F (37.2 C) (05/07 0506) Temp Source: Oral (05/07 0506) BP: 121/61 mmHg (05/07 0506) Pulse Rate: 84 (05/07 0506)  Labs:  Recent Labs  02/18/15 0927 02/19/15 0455  HGB  --  8.3*  HCT  --  26.7*  PLT  --  261  APTT 44*  --   LABPROT 13.2 14.0  INR 0.99 1.07  CREATININE  --  1.10*    Estimated Creatinine Clearance: 61.4 mL/min (by C-G formula based on Cr of 1.1).   Medical History: Past Medical History  Diagnosis Date  . Arthritis   . Diabetes mellitus without complication   . Ulcer   . Hypertension   . Sleep apnea     use CPAP  . Shortness of breath dyspnea     exertion  . Anemia     thalasemia    Assessment: 59 y.o. female complaining of right knee pain for multiple years. Pain had continually increased since the beginning. X-rays in the clinic show end-stage arthritic changes of the right knee.  S/p right total knee arthroplasty to start warfarin for VTE prophylaxis post-op. Baseline INR normal 0.99.  Warfarin point score of 4 based on age/wt/gender.   Lovenox started this am until INR at goal, INR 1.0 this morning, relatively unchanged. Will repeat last nights dose.   Goal of Therapy:  INR 2-3 Monitor platelets by anticoagulation protocol: Yes   Plan:  Lovenox 30mg  q12 hours Warfarin 7.5mg  tonight Daily INR  Erin Hearing PharmD., BCPS Clinical Pharmacist Pager [phone] 02/19/2015 11:23 AM

## 2015-02-19 NOTE — Progress Notes (Signed)
Patient provided with and placed on CPAP for HS.  10 cmH20 used as patient is unsure of her home settings.  Nasal mask and room air used.  Patient tolerated well and is familiar with equipment and procedure.

## 2015-02-19 NOTE — Progress Notes (Signed)
Orthopedics Progress Note  Subjective: My knee feels good this morning. No nausea  Objective:  Filed Vitals:   02/19/15 0506  BP: 121/61  Pulse: 84  Temp: 99 F (37.2 C)  Resp: 14    General: Awake and alert  Musculoskeletal: right knee dressing intact immobilizer on, no pain with calf pumps Neurovascularly intact  Lab Results  Component Value Date   WBC 9.2 02/19/2015   HGB 8.3* 02/19/2015   HCT 26.7* 02/19/2015   MCV 61.5* 02/19/2015   PLT 261 02/19/2015       Component Value Date/Time   NA 133* 02/19/2015 0455   K 4.4 02/19/2015 0455   CL 100* 02/19/2015 0455   CO2 24 02/19/2015 0455   GLUCOSE 154* 02/19/2015 0455   BUN 14 02/19/2015 0455   CREATININE 1.10* 02/19/2015 0455   CREATININE 1.03 11/26/2014 1438   CALCIUM 8.6* 02/19/2015 0455   GFRNONAA 53* 02/19/2015 0455   GFRAA >60 02/19/2015 0455    Lab Results  Component Value Date   INR 1.07 02/19/2015   INR 0.99 02/18/2015   INR 0.9 08/18/2008    Assessment/Plan: POD #1 s/p Procedure(s): RIGHT TOTAL KNEE ARTHROPLASTY Stable this morning after TKR, up with therapy Dressing change tomorrow,  Likely D/C to SNF rehab on Monday  Remo Lipps R. Veverly Fells, MD 02/19/2015 7:26 AM

## 2015-02-19 NOTE — Progress Notes (Signed)
ANTICOAGULATION CONSULT NOTE - Initial Consult  Pharmacy Consult for Lovenox Indication: VTE prophylaxis  Allergies  Allergen Reactions  . Penicillins Hives    Patient Measurements: Height: 5\' 3"  (160 cm) Weight: 226 lb (102.513 kg) IBW/kg (Calculated) : 52.4  Vital Signs: Temp: 99 F (37.2 C) (05/07 0506) Temp Source: Oral (05/07 0506) BP: 121/61 mmHg (05/07 0506) Pulse Rate: 84 (05/07 0506)  Labs:  Recent Labs  02/18/15 0927 02/19/15 0455  HGB  --  8.3*  HCT  --  26.7*  PLT  --  261  APTT 44*  --   LABPROT 13.2 14.0  INR 0.99 1.07  CREATININE  --  1.10*    Estimated Creatinine Clearance: 61.4 mL/min (by C-G formula based on Cr of 1.1).   Medical History: Past Medical History  Diagnosis Date  . Arthritis   . Diabetes mellitus without complication   . Ulcer   . Hypertension   . Sleep apnea     use CPAP  . Shortness of breath dyspnea     exertion  . Anemia     thalasemia    Medications:  Prescriptions prior to admission  Medication Sig Dispense Refill Last Dose  . blood glucose meter kit and supplies KIT Dispense based on patient and insurance preference. Use up to four times daily as directed. (FOR ICD-9 250.00, 250.01). 1 each 0 02/17/2015 at Unknown time  . Dulaglutide (TRULICITY) 1.47 WG/9.5AO SOPN Inject 0.75mg  into the skin weekly (Patient taking differently: Inject 0.75mg  into the skin weekly on Wednesdays) 4 pen 11 Past Week at Unknown time  . glipiZIDE (GLUCOTROL) 5 MG tablet Take 1 tablet (5 mg total) by mouth 2 (two) times daily before a meal. 90 tablet 3 Past Week at Unknown time  . losartan-hydrochlorothiazide (HYZAAR) 100-12.5 MG per tablet Take 1 tablet by mouth daily. 90 tablet 3 Past Week at Unknown time  . metFORMIN (GLUCOPHAGE) 1000 MG tablet Take 1 tablet (1,000 mg total) by mouth 2 (two) times daily with a meal. 180 tablet 3 Past Week at Unknown time  . Multiple Vitamin (MULTIVITAMIN WITH MINERALS) TABS tablet Take 1 tablet by mouth  daily.   Past Month at Unknown time  . diclofenac (VOLTAREN) 75 MG EC tablet Take 1 tablet (75 mg total) by mouth 2 (two) times daily. (Patient not taking: Reported on 02/04/2015) 30 tablet 0 Not Taking at Unknown time  . naproxen sodium (ANAPROX) 220 MG tablet Take 220 mg by mouth 2 (two) times daily as needed (pain).   More than a month at Unknown time  . pravastatin (PRAVACHOL) 20 MG tablet Take 1 tablet (20 mg total) by mouth daily. (Patient not taking: Reported on 02/04/2015) 90 tablet 3 Not Taking at Unknown time  . sodium chloride (OCEAN) 0.65 % SOLN nasal spray Place 1 spray into both nostrils as needed for congestion.   More than a month at Unknown time   Scheduled:  . ferrous sulfate  325 mg Oral TID PC  . glipiZIDE  5 mg Oral BID AC  . hydrochlorothiazide  12.5 mg Oral Daily  . insulin aspart  0-20 Units Subcutaneous TID WC  . insulin aspart  0-5 Units Subcutaneous QHS  . insulin aspart  6 Units Subcutaneous TID WC  . losartan  100 mg Oral Daily  . metFORMIN  1,000 mg Oral BID WC  . multivitamin with minerals  1 tablet Oral Daily  . patient's guide to using coumadin book   Does not apply Once  .  pravastatin  20 mg Oral q1800  . warfarin   Does not apply Once  . Warfarin - Pharmacist Dosing Inpatient   Does not apply q1800   Infusions:  . sodium chloride 50 mL/hr at 02/19/15 0631    Assessment: 59yo female s/p TKR to begin Lovenox bridge to therapeutic INR.  Monitor platelets by anticoagulation protocol: Yes   Plan:  Will begin Lovenox $RemoveBefor'30mg'gapuCoZrSuPy$  SQ Q12H and monitor CBC.  Wynona Neat, PharmD, BCPS  02/19/2015,7:35 AM

## 2015-02-19 NOTE — Discharge Instructions (Signed)
Ice to the knee constantly  Keep incision clean and dry for one week, then ok to shower and get it wet  Weight bearing as tolerated on right leg  DO NOT prop anything behind the knee  CPM 0-90 degrees 6 - 8 hours per day, increase as tolerated  Follow up in two weeks in the office [phone]  Information on my medicine - Coumadin   (Warfarin)  This medication education was reviewed with me or my healthcare representative as part of my discharge preparation.  The pharmacist that spoke with me during my hospital stay was:  Georgina Peer, [facility]  Why was Coumadin prescribed for you? Coumadin was prescribed for you because you have a blood clot or a medical condition that can cause an increased risk of forming blood clots. Blood clots can cause serious health problems by blocking the flow of blood to the heart, lung, or brain. Coumadin can prevent harmful blood clots from forming. As a reminder your indication for Coumadin is:   Blood Clot Prevention After Orthopedic Surgery  What test will check on my response to Coumadin? While on Coumadin (warfarin) you will need to have an INR test regularly to ensure that your dose is keeping you in the desired range. The INR (international normalized ratio) number is calculated from the result of the laboratory test called prothrombin time (PT).  If an INR APPOINTMENT HAS NOT ALREADY BEEN MADE FOR YOU please schedule an appointment to have this lab work done by your health care provider within 7 days. Your INR goal is usually a number between:  2 to 3 or your provider may give you a more narrow range like 2-2.5.  Ask your health care provider during an office visit what your goal INR is.  What  do you need to  know  About  COUMADIN? Take Coumadin (warfarin) exactly as prescribed by your healthcare provider about the same time each day.  DO NOT stop taking without talking to the doctor who prescribed the medication.  Stopping without other blood clot  prevention medication to take the place of Coumadin may increase your risk of developing a new clot or stroke.  Get refills before you run out.  What do you do if you miss a dose? If you miss a dose, take it as soon as you remember on the same day then continue your regularly scheduled regimen the next day.  Do not take two doses of Coumadin at the same time.  Important Safety Information A possible side effect of Coumadin (Warfarin) is an increased risk of bleeding. You should call your healthcare provider right away if you experience any of the following: ? Bleeding from an injury or your nose that does not stop. ? Unusual colored urine (red or dark brown) or unusual colored stools (red or black). ? Unusual bruising for unknown reasons. ? A serious fall or if you hit your head (even if there is no bleeding).  Some foods or medicines interact with Coumadin (warfarin) and might alter your response to warfarin. To help avoid this: ? Eat a balanced diet, maintaining a consistent amount of Vitamin K. ? Notify your provider about major diet changes you plan to make. ? Avoid alcohol or limit your intake to 1 drink for women and 2 drinks for men per day. (1 drink is 5 oz. wine, 12 oz. beer, or 1.5 oz. liquor.)  Make sure that ANY health care provider who prescribes medication for you knows that you  are taking Coumadin (warfarin).  Also make sure the healthcare provider who is monitoring your Coumadin knows when you have started a new medication including herbals and non-prescription products.  Coumadin (Warfarin)  Major Drug Interactions  Increased Warfarin Effect Decreased Warfarin Effect  Alcohol (large quantities) Antibiotics (esp. Septra/Bactrim, Flagyl, Cipro) Amiodarone (Cordarone) Aspirin (ASA) Cimetidine (Tagamet) Megestrol (Megace) NSAIDs (ibuprofen, naproxen, etc.) Piroxicam (Feldene) Propafenone (Rythmol SR) Propranolol (Inderal) Isoniazid (INH) Posaconazole (Noxafil)  Barbiturates (Phenobarbital) Carbamazepine (Tegretol) Chlordiazepoxide (Librium) Cholestyramine (Questran) Griseofulvin Oral Contraceptives Rifampin Sucralfate (Carafate) Vitamin K   Coumadin (Warfarin) Major Herbal Interactions  Increased Warfarin Effect Decreased Warfarin Effect  Garlic Ginseng Ginkgo biloba Coenzyme Q10 Green tea St. Johns wort    Coumadin (Warfarin) FOOD Interactions  Eat a consistent number of servings per week of foods HIGH in Vitamin K (1 serving =  cup)  Collards (cooked, or boiled & drained) Kale (cooked, or boiled & drained) Mustard greens (cooked, or boiled & drained) Parsley *serving size only =  cup Spinach (cooked, or boiled & drained) Swiss chard (cooked, or boiled & drained) Turnip greens (cooked, or boiled & drained)  Eat a consistent number of servings per week of foods MEDIUM-HIGH in Vitamin K (1 serving = 1 cup)  Asparagus (cooked, or boiled & drained) Broccoli (cooked, boiled & drained, or raw & chopped) Brussel sprouts (cooked, or boiled & drained) *serving size only =  cup Lettuce, raw (green leaf, endive, romaine) Spinach, raw Turnip greens, raw & chopped   These websites have more information on Coumadin (warfarin):  FailFactory.se; VeganReport.com.au;

## 2015-02-19 NOTE — Progress Notes (Signed)
Physical Therapy Treatment Patient Details Name: Lynn [name] MRN: [id number] DOB: 08-13-1956 Today's Date: 02/19/2015    History of Present Illness Patient is a 59 y/o female s/p R TKA. PMH of DM, HTN, apnea and anemia.    PT Comments    Patient is making progress with PT and ambulated 25 ft this session.  Pt w/ R knee buckle x1 after ambulating 25 ft.  Pt progressing well w/ therapeutic exercises.  Pt will benefit from continued skilled PT services to increase functional independence and safety.     Follow Up Recommendations  SNF;Supervision/Assistance - 24 hour     Equipment Recommendations  None recommended by PT    Recommendations for Other Services       Precautions / Restrictions Precautions Precautions: Fall;Knee Precaution Comments: Reviewed no pillow under knee Required Braces or Orthoses: Knee Immobilizer - Right Knee Immobilizer - Right: Other (comment) (in bed except when in CPM.) Restrictions Weight Bearing Restrictions: Yes RLE Weight Bearing: Weight bearing as tolerated    Mobility  Bed Mobility Overal bed mobility: Needs Assistance Bed Mobility: Supine to Sit     Supine to sit: Min assist     General bed mobility comments: Mod use of bed rails, min A w/ managing RLE to sitting EOB 2/2 pain in R knee.  Increased time  Transfers Overall transfer level: Needs assistance Equipment used: Rolling walker (2 wheeled) Transfers: Sit to/from Stand Sit to Stand: Min assist         General transfer comment: Cues to push from bed. Pt w/ slight LOB during sit>stand, min A to assist pt to maintain balance.    Ambulation/Gait Ambulation/Gait assistance: Min guard Ambulation Distance (Feet): 25 Feet Assistive device: Rolling walker (2 wheeled) Gait Pattern/deviations: Step-to pattern;Decreased stride length;Decreased stance time - right;Antalgic;Trunk flexed   Gait velocity interpretation: Below normal speed for age/gender General Gait Details: cues to  stand upright and to keep RW in front so she does not have RW too close to body.  R knee buckle x1 after ambulating 25 ft, pt able to maintain balance w/ KI and support from RW.    Stairs            Wheelchair Mobility    Modified Rankin (Stroke Patients Only)       Balance Overall balance assessment: Needs assistance Sitting-balance support: Feet supported;Bilateral upper extremity supported Sitting balance-Leahy Scale: Fair     Standing balance support: Bilateral upper extremity supported;During functional activity Standing balance-Leahy Scale: Fair                      Cognition Arousal/Alertness: Awake/alert Behavior During Therapy: WFL for tasks assessed/performed Overall Cognitive Status: Within Functional Limits for tasks assessed                      Exercises Total Joint Exercises Ankle Circles/Pumps: AROM;Both;20 reps;Supine Quad Sets: AROM;Both;5 reps;Supine Knee Flexion: AROM;AAROM;Right;5 reps;Seated Goniometric ROM: 8-99    General Comments General comments (skin integrity, edema, etc.): Pt's husband w/ close chair follow during ambulation.      Pertinent Vitals/Pain Pain Assessment: 0-10 Pain Score: 8  Pain Location: R knee Pain Descriptors / Indicators: Aching;Moaning;Heaviness;Grimacing;Guarding;Discomfort Pain Intervention(s): Limited activity within patient's tolerance;Monitored during session;Repositioned    Home Living                      Prior Function            PT Goals (  current goals can now be found in the care plan section) Acute Rehab PT Goals Patient Stated Goal: to go to rehab Progress towards PT goals: Progressing toward goals    Frequency  7X/week    PT Plan Current plan remains appropriate    Co-evaluation             End of Session Equipment Utilized During Treatment: Gait belt;Right knee immobilizer Activity Tolerance: Patient tolerated treatment well;Patient limited by  fatigue Patient left: in chair;with call bell/phone within reach;with family/visitor present     Time: 2330-0762 PT Time Calculation (min) (ACUTE ONLY): 24 min  Charges:  $Gait Training: 8-22 mins $Therapeutic Exercise: 8-22 mins                    G Codes:      Joslyn Hy PT, [place] [phone] Pager: [phone] 02/19/2015, 3:56 PM

## 2015-02-19 NOTE — Progress Notes (Signed)
OT Cancellation Note  Patient Details Name: Lynn [name] MRN: [id number] DOB: [birth year]   Cancelled Treatment:    Reason Eval/Treat Not Completed: OT screened.  Pt's current D/C plan is SNF. No apparent immediate acute care OT needs, therefore will defer OT to SNF. If OT eval is needed please call Acute Rehab Dept. at [phone] or text page OT at [phone].    Benito Mccreedy OTR/L [phone] 02/19/2015, 8:32 AM

## 2015-02-19 NOTE — Op Note (Signed)
NAMEMarland Kitchen  Lynn [name], Lynn [name]:  [id number]  MEDICAL RECORD NO.:  [id number]  LOCATION:  5N01C                        FACILITY:  [facility]  PHYSICIAN:  Doran Heater. Veverly Fells, M.D. DATE OF BIRTH:  1956/04/20  DATE OF PROCEDURE:  02/18/2015 DATE OF DISCHARGE:                              OPERATIVE REPORT   PREOPERATIVE DIAGNOSIS:  Right knee end-stage osteoarthritis.  POSTOPERATIVE DIAGNOSIS:  Right knee end-stage osteoarthritis.  PROCEDURE PERFORMED:  Right total knee replacement using DePuy Sigma rotating platform prosthesis.  ATTENDING SURGEON:  Doran Heater. Veverly Fells, M.D.  ASSISTANT:  Abbott Pao. Dixon, P.A., who was scrubbed the entire procedure and necessary for satisfactory completion of surgery.  ANESTHESIA:  Spinal anesthesia was used plus femoral block.  ESTIMATED BLOOD LOSS:  Minimal.  FLUID REPLACEMENT:  1500 mL crystalloid.  INSTRUMENT COUNTS:  Correct.  COMPLICATIONS:  There were no complications.  ANTIBIOTICS:  Perioperative antibiotics were given.  INDICATIONS:  The patient is a 59 year old female with worsening right end-stage knee arthritis.  The patient has had progressive pain despite conservative management, now presents for operative treatment, having failed mobilizes, conservative management, desiring total knee arthroplasty, restore function to eliminate pain to the knee.  Informed consent obtained.  DESCRIPTION OF PROCEDURE:  After an adequate level of anesthesia was achieved, the patient was positioned in the supine position.  Right leg correctly identified.  Nonsterile tourniquet placed in proximal thigh. We then sterilely prepped and draped the leg.  After time-out was called, we exsanguinated the limb, elevated the tourniquet to 350 mmHg. We then placed the knee in flexion.  Longitudinal midline incision was created with the knee in flexion.  Dissection down through subcutaneous tissues, identified the medial parapatellar area and did a  median parapatellar arthrotomy with fresh 10 blade scalpel.  We everted the patella and divided the lateral patellofemoral ligaments, advanced arthritis was noted with bone on bone.  We then went ahead and entered our distal femur with a step-cut drill placed intramedullary distal femoral resection guide set on 10 mm resection, 5 degrees valgus for the right knee.  We went ahead and resected our distal femur.  We then sized the femur to a 2.5 anterior down.  We did our anterior, posterior, and chamfer cuts with the oscillating saw.  We then went ahead and removed our ACL, PCL, and remaining meniscal tissue.  We subluxed the tibia anteriorly and did our tibial cut, 90 degrees perpendicular long axis of the tibia in the frontal plane and then went ahead and had just minimal posterior slope 1-2 degrees for the posterior cruciate substituting prosthesis.  Once we had our tibia cut, we checked our gaps.  Our flexion gap was perfect.  Our extension gap was too tight.  We went back and placed our guides on the femur and resected 2 more mm of distal femoral bone and re-did our chamfer cuts.  At this point, felt we had appropriate gaps.  We then finished our tibial preparation with the modular drill and keel punch for the 2.5 tibia.  Once we had sized that and marked our appropriate rotation, we next did our box cut for the femur using an oscillating saw and a box cut guide  for the 2.5 right femur.  We then impacted our trial components in place, reduced the knee with nice pop on that medial side with 10 poly and we had nice extension and flexion.  We then resurfaced our patella going from 22 mm down to 16 mm of thickness.  We drilled the lugs for the 32 button and then placed the 32 button in place.  We ranged the knee.  It had nice patellar tracking with no-touch technique.  We removed all trial components, pulse irrigated the knee thoroughly and dried it and then cemented the components into  place.  A 2.5 tibia, 2.5 right femur, cemented with DePuy high viscosity cement, and the knee placed in extension with the 10 mm spacer and then also cemented the patella in place with a patellar clamp.  Once the cement was allowed to harden, we removed excess cement with quarter-inch curved osteotome, removed the trial 10 mm insert.  I checked the back of the knee for any additional cement and removed that using a tonsil forceps.  We then went ahead and placed our 2.5 x 10 poly insert in place and reduced the knee again with a nice down medially to get to full extension, nice and stable in flexion and extension.  We ranged the knee.  Patellar tracking was perfect.  We thoroughly irrigated with pulse irrigator and then repaired the parapatellar arthrotomy with #1 Vicryl suture, interrupted and that was done throughout the parapatellar arthrotomy, then we did 0 and 2-0 layered subcutaneous closure and 4-0 Monocryl for skin.  Steri-Strips applied followed by sterile dressing.  The patient tolerated the surgery well.     Doran Heater. Veverly Fells, M.D.     SRN/MEDQ  D:  02/18/2015  T:  02/19/2015  Job:  [id number]

## 2015-02-20 LAB — GLUCOSE, CAPILLARY
Glucose-Capillary: 160 mg/dL — ABNORMAL HIGH (ref 70–99)
Glucose-Capillary: 142 mg/dL — ABNORMAL HIGH (ref 70–99)

## 2015-02-20 LAB — CBC
HCT: 23.1 % — ABNORMAL LOW (ref 36.0–46.0)
Hemoglobin: 7.3 g/dL — ABNORMAL LOW (ref 12.0–15.0)
MCH: 19.3 pg — ABNORMAL LOW (ref 26.0–34.0)
MCHC: 31.6 g/dL (ref 30.0–36.0)
MCV: 60.9 fL — ABNORMAL LOW (ref 78.0–100.0)
Platelets: 241 10*3/uL (ref 150–400)
RBC: 3.79 MIL/uL — ABNORMAL LOW (ref 3.87–5.11)
RDW: 15.5 % (ref 11.5–15.5)
WBC: 11.9 10*3/uL — ABNORMAL HIGH (ref 4.0–10.5)

## 2015-02-20 LAB — PROTIME-INR
INR: 1.14 (ref 0.00–1.49)
Prothrombin Time: 14.7 seconds (ref 11.6–15.2)

## 2015-02-20 LAB — PREPARE RBC (CROSSMATCH)

## 2015-02-20 MED ORDER — SODIUM CHLORIDE 0.9 % IV SOLN
Freq: Once | INTRAVENOUS | Status: AC
  Administered 2015-02-20: 14:00:00 via INTRAVENOUS

## 2015-02-20 MED ORDER — WARFARIN SODIUM 5 MG PO TABS
10.0000 mg | ORAL_TABLET | Freq: Once | ORAL | Status: AC
  Administered 2015-02-20: 10 mg via ORAL
  Filled 2015-02-20: qty 2

## 2015-02-20 NOTE — Progress Notes (Signed)
OT Cancellation Note  Patient Details Name: Lynn [name] MRN: [id number] DOB: November 13, 1955   Cancelled Treatment:    Reason Eval/Treat Not Completed: Other (comment). Pt not feeling up to working with OT and was receiving blood. Would like for OT to try to see her tomorrow.  Benito Mccreedy OTR/L [phone] 02/20/2015, 4:14 PM

## 2015-02-20 NOTE — Progress Notes (Signed)
   Subjective: 2 Days Post-Op Procedure(s) (LRB): RIGHT TOTAL KNEE ARTHROPLASTY (Right)  Pt doing well Denies any dizziness or issues walking or mobilizing Patient reports pain as mild.  Objective:   VITALS:   Filed Vitals:   02/20/15 0646  BP: 127/58  Pulse: 96  Temp: 98.5 F (36.9 C)  Resp: 18    Right knee incision healing well nv intact distally Dressing changed  LABS  Recent Labs  02/19/15 0455 02/20/15 0502  HGB 8.3* 7.3*  HCT 26.7* 23.1*  WBC 9.2 11.9*  PLT 261 241     Recent Labs  02/19/15 0455  NA 133*  K 4.4  BUN 14  CREATININE 1.10*  GLUCOSE 154*     Assessment/Plan: 2 Days Post-Op Procedure(s) (LRB): RIGHT TOTAL KNEE ARTHROPLASTY (Right) Acute blood loss anemia - plan for transfusion today Plan for d/c with home health tomorrow Patient in agreement    Merla Riches, MPAS, PA-C  02/20/2015, 8:00 AM

## 2015-02-20 NOTE — Progress Notes (Signed)
CSW order received to assist patient with SNF placement per PT recommendation- however- note this afternoon reflects that patient's d/c plan has been upgraded to home with Home Health. She will return home with her husband.  CSW attempted to meet with patient earlier but she was nauseated and throwing up. CSW will sign off but will be available should DC plan change.  Thanks!  Lorie Phenix. Murrell Redden [phone] (Weekend Coverage)

## 2015-02-20 NOTE — Progress Notes (Signed)
Physical Therapy Treatment Patient Details Name: Lynn [name] MRN: [id number] DOB: 10-31-1955 Today's Date: 02/20/2015    History of Present Illness Patient is a 59 y/o female s/p R TKA. PMH of DM, HTN, apnea and anemia.    PT Comments    Patient progressing well towards PT goals. Tolerated stair negotiation with Min A for balance/safety. Discharge recommendation updated as pt no longer wants to go to [facility] SNF but wants to return home. Will need to negotiate 1 flight of steps to get to bathroom/bedroom at home. Pt fatigued today most likely 2/2 to low hemoglobin. Due for blood transfusion later today. Will continue to follow and progress as tolerated to prepare pt for home.   Follow Up Recommendations  Home health PT;Supervision/Assistance - 24 hour     Equipment Recommendations  None recommended by PT    Recommendations for Other Services       Precautions / Restrictions Precautions Precautions: Fall;Knee Precaution Booklet Issued: No Precaution Comments: Reviewed no pillow under knee Required Braces or Orthoses: Knee Immobilizer - Right Knee Immobilizer - Right: Other (comment) Restrictions Weight Bearing Restrictions: Yes RLE Weight Bearing: Weight bearing as tolerated    Mobility  Bed Mobility Overal bed mobility: Needs Assistance Bed Mobility: Supine to Sit     Supine to sit: Min guard;HOB elevated     General bed mobility comments: Min guard for safety. Use of trapeze bar to get to EOB.   Transfers Overall transfer level: Needs assistance Equipment used: Rolling walker (2 wheeled) Transfers: Sit to/from Stand Sit to Stand: Min assist         General transfer comment: Min A to rise from EOB. Difficulty with transitioning hand from surface to RW.   Ambulation/Gait Ambulation/Gait assistance: Min guard Ambulation Distance (Feet): 100 Feet Assistive device: Rolling walker (2 wheeled) Gait Pattern/deviations: Step-to pattern;Decreased stride  length;Decreased stance time - right;Decreased step length - left;Trunk flexed;Antalgic   Gait velocity interpretation: Below normal speed for age/gender General Gait Details: Cues for upright posture, RW proximity and to roll RW instead of pick it up for gait. KI donned; no knee buckling today.    Stairs Stairs: Yes Stairs assistance: Min assist Stair Management: Two rails;Step to pattern Number of Stairs: 3 (+ 2 steps x2 bouts.) General stair comments: Cues for technique. Min A for balance/support.  Wheelchair Mobility    Modified Rankin (Stroke Patients Only)       Balance Overall balance assessment: Needs assistance Sitting-balance support: Feet supported;No upper extremity supported Sitting balance-Leahy Scale: Fair     Standing balance support: During functional activity Standing balance-Leahy Scale: Poor                      Cognition Arousal/Alertness: Awake/alert Behavior During Therapy: WFL for tasks assessed/performed Overall Cognitive Status: Within Functional Limits for tasks assessed                      Exercises Total Joint Exercises Ankle Circles/Pumps: Both;15 reps;Seated Quad Sets: Both;5 reps;Seated Knee Flexion: Right;5 reps;Seated Goniometric ROM: 8-80    General Comments        Pertinent Vitals/Pain Pain Assessment: 0-10 Pain Score: 8  Pain Location: right knee Pain Descriptors / Indicators: Sore;Aching Pain Intervention(s): Monitored during session;Repositioned    Home Living                      Prior Function  PT Goals (current goals can now be found in the care plan section) Progress towards PT goals: Progressing toward goals    Frequency  BID    PT Plan Discharge plan needs to be updated    Co-evaluation             End of Session Equipment Utilized During Treatment: Gait belt;Right knee immobilizer Activity Tolerance: Patient tolerated treatment well Patient left: in  chair;with call bell/phone within reach;with family/visitor present     Time: 0263-7858 PT Time Calculation (min) (ACUTE ONLY): 23 min  Charges:  $Gait Training: 8-22 mins $Therapeutic Exercise: 8-22 mins                    G CodesCandy Sledge A 2015/03/12, 12:05 PM Wray Kearns, [place], DPT [phone]

## 2015-02-20 NOTE — Progress Notes (Signed)
[facility] for warfarin  Indication: VTE prophylaxis  Allergies  Allergen Reactions  . Penicillins Hives    Patient Measurements: Height: 5\' 3"  (160 cm) Weight: 226 lb (102.513 kg) IBW/kg (Calculated) : 52.4   Vital Signs: Temp: 98.5 F (36.9 C) (05/08 0646) Temp Source: Oral (05/08 0646) BP: 127/58 mmHg (05/08 0646) Pulse Rate: 96 (05/08 0646)  Labs:  Recent Labs  02/18/15 0927 02/19/15 0455 02/20/15 0502  HGB  --  8.3* 7.3*  HCT  --  26.7* 23.1*  PLT  --  261 241  APTT 44*  --   --   LABPROT 13.2 14.0 14.7  INR 0.99 1.07 1.14  CREATININE  --  1.10*  --     Estimated Creatinine Clearance: 61.4 mL/min (by C-G formula based on Cr of 1.1).   Medical History: Past Medical History  Diagnosis Date  . Arthritis   . Diabetes mellitus without complication   . Ulcer   . Hypertension   . Sleep apnea     use CPAP  . Shortness of breath dyspnea     exertion  . Anemia     thalasemia    Assessment: 59 y.o. female complaining of right knee pain for multiple years. Pain had continually increased since the beginning. X-rays in the clinic show end-stage arthritic changes of the right knee.  S/p right total knee arthroplasty to start warfarin for VTE prophylaxis post-op. Baseline INR normal 0.99.  Warfarin point score of 4 based on age/wt/gender.   Lovenox started 5/7 until INR at goal, INR 1.14 this morning, relatively unchanged. Will increase dose tonight.   Goal of Therapy:  INR 2-3 Monitor platelets by anticoagulation protocol: Yes   Plan:  Lovenox 30mg  q12 hours Warfarin 10 mg tonight Daily INR  Erin Hearing PharmD., BCPS Clinical Pharmacist Pager [phone] 02/20/2015 10:33 AM

## 2015-02-20 NOTE — Progress Notes (Signed)
CPAP is bedside and patient places on herself when ready for bed.

## 2015-02-21 ENCOUNTER — Encounter (HOSPITAL_COMMUNITY): Payer: Self-pay | Admitting: Orthopedic Surgery

## 2015-02-21 LAB — PROTIME-INR
INR: 1.21 (ref 0.00–1.49)
Prothrombin Time: 15.5 seconds — ABNORMAL HIGH (ref 11.6–15.2)

## 2015-02-21 LAB — GLUCOSE, CAPILLARY
Glucose-Capillary: 118 mg/dL — ABNORMAL HIGH (ref 70–99)
Glucose-Capillary: 91 mg/dL (ref 70–99)

## 2015-02-21 LAB — TYPE AND SCREEN
ABO/RH(D): O POS
Antibody Screen: NEGATIVE
Unit division: 0
Unit division: 0

## 2015-02-21 LAB — CBC
HCT: 29.1 % — ABNORMAL LOW (ref 36.0–46.0)
Hemoglobin: 9.5 g/dL — ABNORMAL LOW (ref 12.0–15.0)
MCH: 21.5 pg — ABNORMAL LOW (ref 26.0–34.0)
MCHC: 32.6 g/dL (ref 30.0–36.0)
MCV: 65.8 fL — ABNORMAL LOW (ref 78.0–100.0)
Platelets: 292 10*3/uL (ref 150–400)
RBC: 4.42 MIL/uL (ref 3.87–5.11)
RDW: 20.3 % — ABNORMAL HIGH (ref 11.5–15.5)
WBC: 15.3 10*3/uL — ABNORMAL HIGH (ref 4.0–10.5)

## 2015-02-21 MED ORDER — WARFARIN SODIUM 5 MG PO TABS: 10.0000 mg | ORAL_TABLET | Freq: Once | ORAL | Status: DC

## 2015-02-21 NOTE — Progress Notes (Signed)
Physical Therapy Treatment Patient Details Name: Loye Reininger MRN: [id number] DOB: [birth year] Today's Date: 02/21/2015    History of Present Illness Patient is a 59 y/o female s/p R TKA. PMH of DM, HTN, apnea and anemia.    PT Comments    Patient is progressing well with ambulation and incorporating therex. Planning to DC later today. Will attempt to see patient once more to ensure safety with ambulation and to ensure pain is controlled.   Follow Up Recommendations  Home health PT;Supervision/Assistance - 24 hour     Equipment Recommendations  None recommended by PT    Recommendations for Other Services       Precautions / Restrictions Precautions Precautions: Fall;Knee Precaution Comments: Reviewed no pillow under knee Required Braces or Orthoses: Knee Immobilizer - Right Restrictions Weight Bearing Restrictions: Yes RLE Weight Bearing: Weight bearing as tolerated    Mobility  Bed Mobility   Bed Mobility: Supine to Sit     Supine to sit: Supervision        Transfers Overall transfer level: Needs assistance Equipment used: Rolling walker (2 wheeled)   Sit to Stand: Min guard         General transfer comment: Cues for safe hand placement and not to pull up from RW  Ambulation/Gait Ambulation/Gait assistance: Supervision Ambulation Distance (Feet): 200 Feet Assistive device: Rolling walker (2 wheeled) Gait Pattern/deviations: Step-through pattern;Decreased stride length;Decreased stance time - right;Decreased step length - left   Gait velocity interpretation: Below normal speed for age/gender General Gait Details: Cues for upright posture and safe positioning of RW   Stairs   Stairs assistance: Min guard Stair Management: Step to pattern;Forwards;Two rails Number of Stairs: 5 General stair comments: Cues for technique.   Wheelchair Mobility    Modified Rankin (Stroke Patients Only)       Balance                                     Cognition Arousal/Alertness: Awake/alert Behavior During Therapy: WFL for tasks assessed/performed Overall Cognitive Status: Within Functional Limits for tasks assessed                      Exercises Total Joint Exercises Quad Sets: Both;10 reps Heel Slides: AAROM;Right;10 reps Hip ABduction/ADduction: AAROM;Right;10 reps Straight Leg Raises: AAROM;Right;10 reps    General Comments        Pertinent Vitals/Pain Pain Score: 7  Pain Location: R knee Pain Descriptors / Indicators: Sore Pain Intervention(s): Monitored during session;Repositioned    Home Living                      Prior Function            PT Goals (current goals can now be found in the care plan section) Progress towards PT goals: Progressing toward goals    Frequency  BID    PT Plan Current plan remains appropriate    Co-evaluation             End of Session Equipment Utilized During Treatment: Gait belt;Right knee immobilizer Activity Tolerance: Patient tolerated treatment well Patient left: in chair;with call bell/phone within reach;with family/visitor present     Time: 0801-0826 PT Time Calculation (min) (ACUTE ONLY): 25 min  Charges:  $Gait Training: 8-22 mins $Therapeutic Exercise: 8-22 mins  G Codes:      Jacqualyn Posey 02/21/2015, 8:31 AM 02/21/2015 Jacqualyn Posey PTA [phone] pager [phone] office

## 2015-02-21 NOTE — Progress Notes (Signed)
[place] for warfarin  Indication: VTE prophylaxis  Allergies  Allergen Reactions  . Penicillins Hives    Patient Measurements: Height: 5\' 3"  (160 cm) Weight: 226 lb (102.513 kg) IBW/kg (Calculated) : 52.4   Vital Signs: Temp: 99.1 F (37.3 C) (05/09 0641) Temp Source: Oral (05/09 0641) BP: 135/57 mmHg (05/09 0641) Pulse Rate: 93 (05/09 0641)  Labs:  Recent Labs  02/19/15 0455 02/20/15 0502 02/21/15 0540  HGB 8.3* 7.3* 9.5*  HCT 26.7* 23.1* 29.1*  PLT 261 241 292  LABPROT 14.0 14.7 15.5*  INR 1.07 1.14 1.21  CREATININE 1.10*  --   --     Estimated Creatinine Clearance: 61.4 mL/min (by C-G formula based on Cr of 1.1).   Medical History: Past Medical History  Diagnosis Date  . Arthritis   . Diabetes mellitus without complication   . Ulcer   . Hypertension   . Sleep apnea     use CPAP  . Shortness of breath dyspnea     exertion  . Anemia     thalasemia    Assessment: 36 YOF S/p right total knee arthroplasty on warfarin for VTE prophylaxis post-op. Warfarin point score of 4 based on age/wt/gender. She is also on lovenox until INR at goal, INR 1.21 this morning, increasing very slowly. Hgb 9.5, plt wnl.   Goal of Therapy:  INR 2-3 Monitor platelets by anticoagulation protocol: Yes   Plan:  Lovenox 30mg  q12 hours Warfarin 10 mg tonight Daily INR If discharge home probably will require 7.5 to 10 mg daily.  Maryanna Shape, PharmD, BCPS  Clinical Pharmacist  Pager: [phone]  02/21/2015 10:42 AM

## 2015-02-21 NOTE — Progress Notes (Signed)
Physical Therapy Treatment Patient Details Name: Lynn [name] MRN: [id number] DOB: December 26, 1955 Today's Date: 02/21/2015    History of Present Illness Patient is a 59 y/o female s/p R TKA. PMH of DM, HTN, apnea and anemia.    PT Comments    Ambulated with patient once more today to ensure safety with ambulation and that patient felt safe to DC home. Patient deferred practicing steps as she did well this AM. Patient educated on HEP and given handout to review. Patient herself stated she is ready to DC home.Patient safe to D/C from a mobility standpoint based on progression towards goals set on PT eval.    Follow Up Recommendations  Home health PT;Supervision/Assistance - 24 hour     Equipment Recommendations  None recommended by PT    Recommendations for Other Services       Precautions / Restrictions Precautions Precautions: Fall;Knee Precaution Comments: Reviewed no pillow under knee Required Braces or Orthoses: Knee Immobilizer - Right Restrictions Weight Bearing Restrictions: Yes RLE Weight Bearing: Weight bearing as tolerated    Mobility  Bed Mobility Overal bed mobility: Modified Independent Bed Mobility: Supine to Sit     Supine to sit: Supervision     General bed mobility comments: use of bed rails  Transfers Overall transfer level: Needs assistance Equipment used: Rolling walker (2 wheeled) Transfers: Sit to/from Stand Sit to Stand: Supervision         General transfer comment: supervision needed for hand placement and sit<>stand technique  Ambulation/Gait Ambulation/Gait assistance: Supervision Ambulation Distance (Feet): 150 Feet Assistive device: Rolling walker (2 wheeled) Gait Pattern/deviations: Step-through pattern;Decreased stride length   Gait velocity interpretation: Below normal speed for age/gender General Gait Details: Cues for upright posture and safe positioning of RW   Stairs   Stairs assistance: Min guard Stair Management:  Step to pattern;Forwards;Two rails Number of Stairs: 5 General stair comments: deferred practiced this session  Wheelchair Mobility    Modified Rankin (Stroke Patients Only)       Balance Overall balance assessment: Needs assistance Sitting-balance support: No upper extremity supported;Feet supported Sitting balance-Leahy Scale: Good     Standing balance support: Bilateral upper extremity supported;During functional activity Standing balance-Leahy Scale: Good                      Cognition Arousal/Alertness: Awake/alert Behavior During Therapy: WFL for tasks assessed/performed Overall Cognitive Status: Within Functional Limits for tasks assessed                      Exercises Total Joint Exercises Quad Sets: Both;10 reps Heel Slides: AAROM;Right;10 reps Hip ABduction/ADduction: AAROM;Right;10 reps Straight Leg Raises: AAROM;Right;10 reps    General Comments        Pertinent Vitals/Pain Pain Assessment: 0-10 Pain Score: 6  Pain Location: R knee Pain Descriptors / Indicators: Sore Pain Intervention(s): Monitored during session;Repositioned    Home Living Family/patient expects to be discharged to:: Private residence Living Arrangements: Spouse/significant other Available Help at Discharge: Family;Available 24 hours/day Type of Home: House Home Access: Stairs to enter Entrance Stairs-Rails: Right Home Layout: Two level;Bed/bath upstairs Home Equipment: Walker - 2 wheels;Cane - single point Additional Comments: tub/shower with curtain, walk-in with sliding doors    Prior Function Level of Independence: Independent with assistive device(s)      Comments: Pt using SPC vs RW PTA. Has Cipap. Drives. Does IADLs PTA.   PT Goals (current goals can now be found in the care plan section) Acute  Rehab PT Goals Patient Stated Goal: go home today Progress towards PT goals: Progressing toward goals    Frequency  BID    PT Plan Current plan remains  appropriate    Co-evaluation             End of Session Equipment Utilized During Treatment: Gait belt;Right knee immobilizer Activity Tolerance: Patient tolerated treatment well Patient left: in chair;with call bell/phone within reach;with family/visitor present     Time: 3361-2244 PT Time Calculation (min) (ACUTE ONLY): 24 min  Charges:  $Gait Training: 23-37 mins $Therapeutic Exercise: 8-22 mins                    G Codes:      Jacqualyn Posey 02/21/2015, 12:13 PM 02/21/2015 Jacqualyn Posey PTA [phone] pager [phone] office

## 2015-02-21 NOTE — Progress Notes (Signed)
   Subjective: 3 Days Post-Op Procedure(s) (LRB): RIGHT TOTAL KNEE ARTHROPLASTY (Right)  Pt doing well Therapy going well Pain well controlled Patient reports pain as mild.  Objective:   VITALS:   Filed Vitals:   02/21/15 0641  BP: 135/57  Pulse: 93  Temp: 99.1 F (37.3 C)  Resp: 16    Right knee incision healing well nv intact distally No rashes or edema Good rom  LABS  Recent Labs  02/19/15 0455 02/20/15 0502 02/21/15 0540  HGB 8.3* 7.3* 9.5*  HCT 26.7* 23.1* 29.1*  WBC 9.2 11.9* 15.3*  PLT 261 241 292     Recent Labs  02/19/15 0455  NA 133*  K 4.4  BUN 14  CREATININE 1.10*  GLUCOSE 154*     Assessment/Plan: 3 Days Post-Op Procedure(s) (LRB): RIGHT TOTAL KNEE ARTHROPLASTY (Right) D/c home today F/u in 2 weeks Home therapy thru [facility], MPAS, PA-C  02/21/2015, 10:05 AM

## 2015-02-21 NOTE — Discharge Summary (Signed)
Physician Discharge Summary   Patient ID: Lynn [name] MRN: [id number] DOB/AGE: 1955-12-23 59 y.o.  Admit date: 02/18/2015 Discharge date: 02/21/2015  Admission Diagnoses:  Active Problems:   S/P TKR (total knee replacement) using cement   Discharge Diagnoses:  Same Acute blood loss anemia - improved after transfusion  Surgeries: Procedure(s): RIGHT TOTAL KNEE ARTHROPLASTY on 02/18/2015   Consultants: PT/OT  Discharged Condition: Stable  Hospital Course: Lynn [name] is an 59 y.o. female who was admitted 02/18/2015 with a chief complaint of No chief complaint on file. , and found to have a diagnosis of <principal problem not specified>.  They were brought to the operating room on 02/18/2015 and underwent the above named procedures.    The patient had an uncomplicated hospital course and was stable for discharge.  Recent vital signs:  Filed Vitals:   02/21/15 0641  BP: 135/57  Pulse: 93  Temp: 99.1 F (37.3 C)  Resp: 16    Recent laboratory studies:  Results for orders placed or performed during the hospital encounter of 02/18/15  Glucose, capillary  Result Value Ref Range   Glucose-Capillary 119 (H) 70 - 99 mg/dL  APTT  Result Value Ref Range   aPTT 44 (H) 24 - 37 seconds  Protime-INR  Result Value Ref Range   Prothrombin Time 13.2 11.6 - 15.2 seconds   INR 0.99 0.00 - 1.49  Glucose, capillary  Result Value Ref Range   Glucose-Capillary 112 (H) 70 - 99 mg/dL   Comment 1 Notify RN   Glucose, capillary  Result Value Ref Range   Glucose-Capillary 128 (H) 70 - 99 mg/dL  Protime-INR  Result Value Ref Range   Prothrombin Time 14.0 11.6 - 15.2 seconds   INR 1.07 0.00 - 1.49  CBC  Result Value Ref Range   WBC 9.2 4.0 - 10.5 K/uL   RBC 4.34 3.87 - 5.11 MIL/uL   Hemoglobin 8.3 (L) 12.0 - 15.0 g/dL   HCT 26.7 (L) 36.0 - 46.0 %   MCV 61.5 (L) 78.0 - 100.0 fL   MCH 19.1 (L) 26.0 - 34.0 pg   MCHC 31.1 30.0 - 36.0 g/dL   RDW 15.8 (H) 11.5 - 15.5 %   Platelets 261  150 - 400 K/uL  Basic metabolic panel  Result Value Ref Range   Sodium 133 (L) 135 - 145 mmol/L   Potassium 4.4 3.5 - 5.1 mmol/L   Chloride 100 (L) 101 - 111 mmol/L   CO2 24 22 - 32 mmol/L   Glucose, Bld 154 (H) 70 - 99 mg/dL   BUN 14 6 - 20 mg/dL   Creatinine, Ser 1.10 (H) 0.44 - 1.00 mg/dL   Calcium 8.6 (L) 8.9 - 10.3 mg/dL   GFR calc non Af Amer 53 (L) >60 mL/min   GFR calc Af Amer >60 >60 mL/min   Anion gap 9 5 - 15  Glucose, capillary  Result Value Ref Range   Glucose-Capillary 167 (H) 70 - 99 mg/dL  Glucose, capillary  Result Value Ref Range   Glucose-Capillary 140 (H) 70 - 99 mg/dL  Glucose, capillary  Result Value Ref Range   Glucose-Capillary 113 (H) 70 - 99 mg/dL  Protime-INR  Result Value Ref Range   Prothrombin Time 14.7 11.6 - 15.2 seconds   INR 1.14 0.00 - 1.49  CBC  Result Value Ref Range   WBC 11.9 (H) 4.0 - 10.5 K/uL   RBC 3.79 (L) 3.87 - 5.11 MIL/uL   Hemoglobin 7.3 (L) 12.0 - 15.0  g/dL   HCT 23.1 (L) 36.0 - 46.0 %   MCV 60.9 (L) 78.0 - 100.0 fL   MCH 19.3 (L) 26.0 - 34.0 pg   MCHC 31.6 30.0 - 36.0 g/dL   RDW 15.5 11.5 - 15.5 %   Platelets 241 150 - 400 K/uL  Glucose, capillary  Result Value Ref Range   Glucose-Capillary 127 (H) 70 - 99 mg/dL  Glucose, capillary  Result Value Ref Range   Glucose-Capillary 138 (H) 70 - 99 mg/dL  Glucose, capillary  Result Value Ref Range   Glucose-Capillary 160 (H) 70 - 99 mg/dL  Protime-INR  Result Value Ref Range   Prothrombin Time 15.5 (H) 11.6 - 15.2 seconds   INR 1.21 0.00 - 1.49  CBC  Result Value Ref Range   WBC 15.3 (H) 4.0 - 10.5 K/uL   RBC 4.42 3.87 - 5.11 MIL/uL   Hemoglobin 9.5 (L) 12.0 - 15.0 g/dL   HCT 29.1 (L) 36.0 - 46.0 %   MCV 65.8 (L) 78.0 - 100.0 fL   MCH 21.5 (L) 26.0 - 34.0 pg   MCHC 32.6 30.0 - 36.0 g/dL   RDW 20.3 (H) 11.5 - 15.5 %   Platelets 292 150 - 400 K/uL  Glucose, capillary  Result Value Ref Range   Glucose-Capillary 142 (H) 70 - 99 mg/dL  Glucose, capillary  Result  Value Ref Range   Glucose-Capillary 118 (H) 70 - 99 mg/dL  ABO/Rh  Result Value Ref Range   ABO/RH(D) O POS   Prepare RBC  Result Value Ref Range   Order Confirmation ORDER PROCESSED BY BLOOD BANK     Discharge Medications:     Medication List    STOP taking these medications        diclofenac 75 MG EC tablet  Commonly known as:  VOLTAREN     naproxen sodium 220 MG tablet  Commonly known as:  ANAPROX      TAKE these medications        blood glucose meter kit and supplies Kit  Dispense based on patient and insurance preference. Use up to four times daily as directed. (FOR ICD-9 250.00, 250.01).     Dulaglutide 0.75 MG/0.5ML Sopn  Commonly known as:  TRULICITY  Inject 0.01VC into the skin weekly     glipiZIDE 5 MG tablet  Commonly known as:  GLUCOTROL  Take 1 tablet (5 mg total) by mouth 2 (two) times daily before a meal.     losartan-hydrochlorothiazide 100-12.5 MG per tablet  Commonly known as:  HYZAAR  Take 1 tablet by mouth daily.     metFORMIN 1000 MG tablet  Commonly known as:  GLUCOPHAGE  Take 1 tablet (1,000 mg total) by mouth 2 (two) times daily with a meal.     methocarbamol 500 MG tablet  Commonly known as:  ROBAXIN  Take 1 tablet (500 mg total) by mouth 3 (three) times daily as needed.     multivitamin with minerals Tabs tablet  Take 1 tablet by mouth daily.     oxyCODONE-acetaminophen 5-325 MG per tablet  Commonly known as:  ROXICET  Take 1-2 tablets by mouth every 4 (four) hours as needed for severe pain.     pravastatin 20 MG tablet  Commonly known as:  PRAVACHOL  Take 1 tablet (20 mg total) by mouth daily.     sodium chloride 0.65 % Soln nasal spray  Commonly known as:  OCEAN  Place 1 spray into both nostrils as needed for  congestion.     warfarin 5 MG tablet  Commonly known as:  COUMADIN  Take 1 tablet (5 mg total) by mouth daily. Take as directed per the pharmacist for INR target from 2.5-3.0 for 30 days post op        Diagnostic  Studies: Dg Knee Right Port  02/18/2015   CLINICAL DATA:  Post RIGHT total knee replacement  EXAM: PORTABLE RIGHT KNEE - 1-2 VIEW  COMPARISON:  Portable exam 1329 hours without priors for comparison  FINDINGS: Components of RIGHT knee prosthesis in expected positions.  Bones appear slightly demineralized.  Small bone fragment identified lateral to the lateral femoral condyle, of uncertain origin.  Non fused ossicles at tibial tubercle.  No additional fracture, dislocation or bone destruction.  IMPRESSION: RIGHT knee prosthesis without acute bony findings.  Small bone fragment lateral to the lateral femoral condyle.   Electronically Signed   By: Lavonia Dana M.D.   On: 02/18/2015 14:09    Disposition:         Follow-up Information    Follow up with NORRIS,STEVEN R, MD. Call in 2 weeks.   Specialty:  Orthopedic Surgery   Why:  [phone]   Contact information:   [address] [place] [address] [phone]        Signed: Ventura Bruns 02/21/2015, 10:06 AM

## 2015-02-21 NOTE — Progress Notes (Signed)
Occupational Therapy Evaluation Patient Details Name: Lynn [name] MRN: [id number] DOB: 1956/07/12 Today's Date: 02/21/2015    History of Present Illness Patient is a 59 y/o female s/p R TKA. PMH of DM, HTN, apnea and anemia.   Clinical Impression   Patient admitted with above. Patient mod I PTA. Patient currently functioning at an overall mod I level, needing occasional cueing for safety with sit<>stands and use of RW. D/C from acute OT services and no additional follow-up OT needs at this time. All appropriate education provided to patient. Please re-order OT if needed.      Follow Up Recommendations  No OT follow up;Supervision - Intermittent    Equipment Recommendations  3 in 1 bedside comode    Recommendations for Other Services  None at this time   Precautions / Restrictions Precautions Precautions: Fall;Knee Precaution Comments: Reviewed no pillow under knee Required Braces or Orthoses: Knee Immobilizer - Right Restrictions Weight Bearing Restrictions: Yes RLE Weight Bearing: Weight bearing as tolerated      Mobility Bed Mobility Overal bed mobility: Modified Independent Bed Mobility: Supine to Sit     Supine to sit: Supervision     General bed mobility comments: use of bed rails  Transfers Overall transfer level: Needs assistance Equipment used: Rolling walker (2 wheeled) Transfers: Sit to/from Stand Sit to Stand: Supervision         General transfer comment: supervision needed for hand placement and sit<>stand technique    Balance Overall balance assessment: Needs assistance Sitting-balance support: No upper extremity supported;Feet supported Sitting balance-Leahy Scale: Good     Standing balance support: Bilateral upper extremity supported;During functional activity Standing balance-Leahy Scale: Good    ADL Overall ADL's : Modified independent General ADL Comments: Patient overall mod I with ADLs and functional mobility using RW. Educated  patient on safe and effective tub/shower and walk-in shower transfers using 3-in-1. Educated patient on importance of wearing right KI for safety until otherwise noted from DR or PT.     Pertinent Vitals/Pain Pain Assessment: 0-10 Pain Score: 9  Pain Location: RLE (knee) Pain Descriptors / Indicators: Sore Pain Intervention(s): Limited activity within patient's tolerance;Monitored during session;Patient requesting pain meds-RN notified     Hand Dominance Right   Extremity/Trunk Assessment Upper Extremity Assessment Upper Extremity Assessment: Overall WFL for tasks assessed   Lower Extremity Assessment Lower Extremity Assessment: Defer to PT evaluation   Cervical / Trunk Assessment Cervical / Trunk Assessment: Normal   Communication Communication Communication: No difficulties   Cognition Arousal/Alertness: Awake/alert Behavior During Therapy: WFL for tasks assessed/performed Overall Cognitive Status: Within Functional Limits for tasks assessed              Home Living Family/patient expects to be discharged to:: Private residence Living Arrangements: Spouse/significant other Available Help at Discharge: Family;Available 24 hours/day Type of Home: House Home Access: Stairs to enter [employer] of Steps: 6 Entrance Stairs-Rails: Right Home Layout: Two level;Bed/bath upstairs Alternate Level Stairs-Number of Steps: 1 flight Alternate Level Stairs-Rails: Right Bathroom Shower/Tub: Tub/shower unit;Walk-in shower   Bathroom Toilet: Standard     Home Equipment: Environmental consultant - 2 wheels;Cane - single point   Additional Comments: tub/shower with curtain, walk-in with sliding doors      Prior Functioning/Environment Level of Independence: Independent with assistive device(s)        Comments: Pt using SPC vs RW PTA. Has Cipap. Drives. Does IADLs PTA.    OT Diagnosis:  n/a, no further OT needs identified    OT Problem List:  n/a, no further OT needs identified     OT Treatment/Interventions:   n/a, no further OT needs identified     OT Goals(Current goals can be found in the care plan section) Acute Rehab OT Goals Patient Stated Goal: go home today  OT Frequency:   n/a, no further OT needs identified    Barriers to D/C:  None at this time   End of Session Equipment Utilized During Treatment: Rolling walker;Right knee immobilizer CPM Right Knee CPM Right Knee: Off  Activity Tolerance: Patient tolerated treatment well Patient left: in bed;with call bell/phone within reach;with nursing/sitter in room   Time: 0951-1010 OT Time Calculation (min): 19 min Charges:  OT General Charges $OT Visit: 1 Procedure OT Evaluation $Initial OT Evaluation Tier I: 1 Procedure   Norissa Bartee,[place] , MS, OTR/L, CLT Pager: [phone]  02/21/2015, 10:25 AM

## 2015-02-21 NOTE — Care Management Note (Signed)
Case Management Note  Patient Details  Name: Lynn [name] MRN: [id number] Date of Birth: 1956-01-05  Subjective/Objective:     59 yr old female admitted with osteoarthritis of the right knee. Patient underwent a right total knee arthroplasty.               Action/Plan:    Case manager spoke with patient concerning home health and DME needs. Choice offered. Referal called to [place], [facility] Liaison.  Expected Discharge Date:  02/21/15                 Expected Discharge Plan:  [place]  In-House Referral:     Discharge planning Services  CM Consult  Post Acute Care Choice:  Home Health Choice offered to:  Patient  DME Arranged:  3-N-1, Walker rolling, CPM DME Agency:  [employer]  HH Arranged:  PT, RN [place] Agency:  [facility]  Status of Service:  Completed, signed off  Medicare Important Message Given:    Date Medicare IM Given:    Medicare IM give by:    Date Additional Medicare IM Given:    Additional Medicare Important Message give by:     If discussed at [place] of Stay Meetings, dates discussed:    Additional Comments:  Ninfa Meeker, RN 02/21/2015, 11:53 AM

## 2015-02-25 ENCOUNTER — Encounter: Payer: BLUE CROSS/BLUE SHIELD | Admitting: Vascular Surgery

## 2015-03-07 ENCOUNTER — Ambulatory Visit (INDEPENDENT_AMBULATORY_CARE_PROVIDER_SITE_OTHER): Payer: BLUE CROSS/BLUE SHIELD | Admitting: Internal Medicine

## 2015-03-07 VITALS — BP 142/78 | HR 90 | Temp 98.4°F | Resp 18 | Ht 63.25 in | Wt 223.0 lb

## 2015-03-07 DIAGNOSIS — Z79899 Other long term (current) drug therapy: Secondary | ICD-10-CM

## 2015-03-07 DIAGNOSIS — H34811 Central retinal vein occlusion, right eye: Secondary | ICD-10-CM

## 2015-03-07 DIAGNOSIS — Z96651 Presence of right artificial knee joint: Secondary | ICD-10-CM

## 2015-03-07 DIAGNOSIS — H348112 Central retinal vein occlusion, right eye, stable: Secondary | ICD-10-CM

## 2015-03-07 DIAGNOSIS — H53139 Sudden visual loss, unspecified eye: Secondary | ICD-10-CM | POA: Insufficient documentation

## 2015-03-07 NOTE — Patient Instructions (Signed)
Total Knee Replacement °Total knee replacement is a procedure to replace your knee joint with an artificial knee joint (prosthetic knee joint). The purpose of this surgery is to reduce pain and improve your knee function. °LET YOUR HEALTH CARE PROVIDER KNOW ABOUT:  °· Any allergies you have. °· All medicines you are taking, including vitamins, herbs, eye drops, creams, and over-the-counter medicines. °· Previous problems you or members of your family have had with the use of anesthetics. °· Any blood disorders you have. °· Previous surgeries you have had. °· Medical conditions you have. °RISKS AND COMPLICATIONS  °Generally, total knee replacement is a safe procedure. However, problems can occur and include: °· Loss of range of motion of the knee or instability. °· Loosening of the prosthesis. °· Infection. °· Persistent pain. °BEFORE THE PROCEDURE  °· Do not eat or drink anything after midnight on the night before the procedure or as directed by your health care provider. °· Ask your health care provider about changing or stopping your regular medicines. This is especially important if you are taking diabetes medicines or blood thinners. °PROCEDURE  °· Just before the procedure, you will receive medicine that will make you drowsy (sedative). This will be given through a tube that is inserted into one of your veins (IV tube). °· Then you will be given one of the following: °¨ A medicine injected into your spine that numbs your body below the waist (spinal anesthetic). °¨ A medicine that makes you fall asleep (general anesthetic). °¨ You may also receive medicine to block feeling in your leg (nerve block) to help ease pain after surgery. °· An incision will be made in your knee. Your surgeon will take out any damaged cartilage and bone by sawing off the damaged surfaces. °· The surgeon will then put a new metal liner over the sawed-off portion of your thigh bone (femur) and a plastic liner over the sawed-off portion  of one of the bones of your lower leg (tibia). This is to restore alignment and function to your knee. A plastic piece is often used to restore the surface of your knee cap. °AFTER THE PROCEDURE  °· You will be taken to the recovery area. °· You may have drainage tubes to drain excess fluid from your knee. These tubes attach to a device that removes these fluids. °· Once you are awake, stable, and taking fluids well, you will be taken to your hospital room. °· You will receive physical therapy as prescribed by your health care provider. °· Your surgeon may recommend that you spend time (usually an additional 10-14 days) in an extended-care facility to help you begin walking again and improve your range of motion before you go home. °· You may also be prescribed blood-thinning medicine to decrease your risk of developing blood clots in your leg. °Document Released: 01/07/2001 Document Revised: 02/15/2014 Document Reviewed: 11/11/2011 °ExitCare® Patient Information ©2015 [employer]. This information is not intended to replace advice given to you by your health care provider. Make sure you discuss any questions you have with your health care provider. ° °

## 2015-03-07 NOTE — Progress Notes (Signed)
   Subjective:    Patient ID: Lynn [name], female    DOB: 03-13-1956, 59 y.o.   MRN: [id number]  HPI  I, Niger Simpson, [place] am scribing for Dr. Elder Cyphers.  Pt is here to check PTINR. She is currently taking Coumadin to prevent blood clots.  Dr. Baird Cancer checked pt's eyes about 3 weeks before surgery and she had a retinal clot in the vein of the right eye. He follows this closely. He ordered an ultrasound of pt's neck to check her carotids. Pt is scheduled to see an avascular specialist 03/16/2015. Doing very well post surgery. Review of Systems     Objective:   Physical Exam  Constitutional: She is oriented to person, place, and time. She appears well-developed and well-nourished.  HENT:  Head: Normocephalic.  Mouth/Throat: Oropharynx is clear and moist.  Eyes: Conjunctivae and EOM are normal. Pupils are equal, round, and reactive to light.  Neck: Normal range of motion. Neck supple.  Cardiovascular: Normal rate, regular rhythm and normal heart sounds.   Pulmonary/Chest: Effort normal and breath sounds normal.  Neurological: She is alert and oriented to person, place, and time. She exhibits normal muscle tone. Coordination normal.          Assessment & Plan:  TKR on anticoagulation/PT/INR today Central retinal vein occlusion right improving/followed by Dr. Tye Savoy

## 2015-03-08 ENCOUNTER — Encounter: Payer: Self-pay | Admitting: Family Medicine

## 2015-03-08 LAB — PROTIME-INR
INR: 2.1 — ABNORMAL HIGH (ref <1.50)
Prothrombin Time: 23.6 seconds — ABNORMAL HIGH (ref 11.6–15.2)

## 2015-03-15 ENCOUNTER — Encounter: Payer: Self-pay | Admitting: Vascular Surgery

## 2015-03-16 ENCOUNTER — Ambulatory Visit (INDEPENDENT_AMBULATORY_CARE_PROVIDER_SITE_OTHER): Payer: BLUE CROSS/BLUE SHIELD | Admitting: Vascular Surgery

## 2015-03-16 ENCOUNTER — Encounter: Payer: Self-pay | Admitting: Vascular Surgery

## 2015-03-16 VITALS — BP 121/80 | HR 75 | Resp 16 | Ht 65.0 in | Wt 220.0 lb

## 2015-03-16 DIAGNOSIS — H53131 Sudden visual loss, right eye: Secondary | ICD-10-CM | POA: Diagnosis not present

## 2015-03-16 NOTE — Progress Notes (Signed)
New Carotid Patient  Referred by:  [name], MD [address] [place], [place] [address]  Reason for referral: right eye occlusion  History of Present Illness  Lynn [name] is a 59 y.o. (27-Sep-1956) female who presents with chief complaint: episode of transient right eye blurriness.  Reported this patient had a recent episode of sudden onset of blurring in right eye, which resolved with time spontaneously.  I do NOT have the opthalmologist's notes so I'm certain if she really had a retinal VEIN occlusion as documented in her electronic chart.  This then lead her to get doppler studies of her carotids.  Previous carotid studies demonstrated: RICA 6-46% stenosis, LICA 8-03% stenosis.  The patient has never had facial drooping or hemiplegia.  The patient has never had receptive or expressive aphasia.   The patient's previous neurologic deficits have resolved.  The patient's risks factors for carotid disease include: DM, HD, HTN, and prior smoking.  Past Medical History  Diagnosis Date  . Arthritis   . Diabetes mellitus without complication   . Ulcer   . Hypertension   . Sleep apnea     use CPAP  . Shortness of breath dyspnea     exertion  . Anemia     thalasemia    Past Surgical History  Procedure Laterality Date  . Carpal tunnel release Right 11/01/2014  . Arthroscopic knee Left 2-16  . Abdominal hysterectomy    . Tonsillectomy    . Total knee arthroplasty Right 02/18/2015    Procedure: RIGHT TOTAL KNEE ARTHROPLASTY;  Surgeon: Netta Cedars, MD;  Location: [facility];  Service: Orthopedics;  Laterality: Right;    History   Social History  . Marital Status: Married    Spouse Name: N/A  . Number of Children: N/A  . Years of Education: N/A   Occupational History  . Not on file.   Social History Main Topics  . Smoking status: Former Smoker -- 0.25 packs/day for 44 years    Quit date: 02/17/2015  . Smokeless tobacco: Never Used  . Alcohol Use: 0.0 oz/week    0  Standard drinks or equivalent per week     Comment: socially  . Drug Use: No  . Sexual Activity: Not on file   Other Topics Concern  . Not on file   Social History Narrative    Family History  Problem Relation Age of Onset  . Cancer Mother   . Diabetes Mother   . Hypertension Mother   . Hyperlipidemia Mother   . Cancer Father   . Hyperlipidemia Father   . Mental illness Sister   . Diabetes Sister   . Diabetes Maternal Grandmother   . Diabetes Maternal Grandfather     Current Outpatient Prescriptions on File Prior to Visit  Medication Sig Dispense Refill  . blood glucose meter kit and supplies KIT Dispense based on patient and insurance preference. Use up to four times daily as directed. (FOR ICD-9 250.00, 250.01). 1 each 0  . Dulaglutide (TRULICITY) 2.12 YQ/8.2NO SOPN Inject 0.75mg  into the skin weekly (Patient taking differently: Inject 0.75mg  into the skin weekly on Wednesdays) 4 pen 11  . glipiZIDE (GLUCOTROL) 5 MG tablet Take 1 tablet (5 mg total) by mouth 2 (two) times daily before a meal. 90 tablet 3  . losartan-hydrochlorothiazide (HYZAAR) 100-12.5 MG per tablet Take 1 tablet by mouth daily. 90 tablet 3  . metFORMIN (GLUCOPHAGE) 1000 MG tablet Take 1 tablet (1,000 mg total) by mouth 2 (two)  times daily with a meal. 180 tablet 3  . methocarbamol (ROBAXIN) 500 MG tablet Take 1 tablet (500 mg total) by mouth 3 (three) times daily as needed. 60 tablet 1  . Multiple Vitamin (MULTIVITAMIN WITH MINERALS) TABS tablet Take 1 tablet by mouth daily.    Marland Kitchen oxyCODONE-acetaminophen (ROXICET) 5-325 MG per tablet Take 1-2 tablets by mouth every 4 (four) hours as needed for severe pain. 60 tablet 0  . sodium chloride (OCEAN) 0.65 % SOLN nasal spray Place 1 spray into both nostrils as needed for congestion.    Marland Kitchen warfarin (COUMADIN) 5 MG tablet Take 1 tablet (5 mg total) by mouth daily. Take as directed per the pharmacist for INR target from 2.5-3.0 for 30 days post op 40 tablet 0  .  pravastatin (PRAVACHOL) 20 MG tablet Take 1 tablet (20 mg total) by mouth daily. (Patient not taking: Reported on 02/04/2015) 90 tablet 3   No current facility-administered medications on file prior to visit.    Allergies  Allergen Reactions  . Penicillins Hives    REVIEW OF SYSTEMS:  (Positives checked otherwise negative)  CARDIOVASCULAR:  []  chest pain, []  chest pressure, []  palpitations, []  shortness of breath when laying flat, []  shortness of breath with exertion,  []  pain in feet when walking, []  pain in feet when laying flat, []  history of blood clot in veins (DVT), []  history of phlebitis, []  swelling in legs, []  varicose veins  PULMONARY:  []  productive cough, []  asthma, []  wheezing  NEUROLOGIC:  []  weakness in arms or legs, []  numbness in arms or legs, []  difficulty speaking or slurred speech, []  temporary loss of vision in one eye, []  dizziness  HEMATOLOGIC:  []  bleeding problems, []  problems with blood clotting too easily  MUSCULOSKEL:  []  joint pain, []  joint swelling  GASTROINTEST:  []  vomiting blood, []  blood in stool     GENITOURINARY:  []  burning with urination, []  blood in urine  PSYCHIATRIC:  []  history of major depression  INTEGUMENTARY:  []  rashes, []  ulcers  CONSTITUTIONAL:  []  fever, []  chills   Physical Examination  Filed Vitals:   03/16/15 1058 03/16/15 1059  BP: 102/70 121/80  Pulse: 72 75  Resp: 16   Height: 5\' 5"  (1.651 m)   Weight: 220 lb (99.791 kg)    Body mass index is 36.61 kg/(m^2).  General: A&O x 3, WD, Obese,   Head: [place]/AT  Ear/Nose/Throat: Hearing grossly intact, nares w/o erythema or drainage, oropharynx w/o Erythema/Exudate, Mallampati score: 3  Eyes: PERRLA, EOMI  Neck: Supple, no nuchal rigidity, no palpable LAD  Pulmonary: Sym exp, good air movt, CTAB, no rales, rhonchi, & wheezing  Cardiac: RRR, Nl S1, S2, no Murmurs, rubs or gallops  Vascular: Vessel Right Left  Radial Palpable Palpable  Brachial Palpable  Palpable  Carotid Palpable, without bruit Palpable, without bruit  Aorta  Not palpable due to obesity N/A  Femoral Palpable Palpable  Popliteal Not palpable Not palpable  PT Palpable Palpable  DP Palpable Palpable   Gastrointestinal: soft, NTND, -G/R, - HSM, - masses, - CVAT B  Musculoskeletal: M/S 5/5 throughout , Extremities without ischemic changes   Neurologic: CN 2-12 intact , Pain and light touch intact in extremities , Motor exam as listed above  Psychiatric: Judgment intact, Mood & affect appropriate for pt's clinical situation  Dermatologic: See M/S exam for extremity exam, no rashes otherwise noted  Lymph : No Cervical, Axillary, or Inguinal lymphadenopathy   Non-Invasive Vascular Imaging  CAROTID DUPLEX (  Date: 02/14/15):   R ICA stenosis: 1-39%  R VA: patent and antegrade  L ICA stenosis: 1-39%  L VA: patent and antegrade  Outside Studies/Documentation 3 pages of outside documents were reviewed including: hospital carotid duplex, tel con note.  Medical Decision Making  Lynn [name] is a 59 y.o. female who presents with: B ICA stenosis <50%, likely transient R retinal artery occlusion   Suspect the EMR is incorrectly documenting a retinal vein occlusion, as obviously there is no mechanism for thromboembolism to retinal vein from the carotid arteries.  As 80% of stroke are unrelated to the carotid arteries, I doubt this is due to B ICA stenosis 1-39%.  Further work-up would included: echocardiogram +/- CTA chest if concerned for atherosclerotic disease in aortic arch.  I discussed in depth with the patient the nature of atherosclerosis, and emphasized the importance of maximal medical management including strict control of blood pressure, blood glucose, and lipid levels, obtaining regular exercise, antiplatelet agents, and cessation of smoking.   The patient is currently documented on a statin: Pravastatin.    The patient is currently not as she is on  Warfarin currently for her s/p R TKR per protocol with the Orthopedist.  The patient is aware that without maximal medical management the underlying atherosclerotic disease process will progress, limiting the benefit of any interventions.  In this patient, I would probably recheck the B carotid duplex in 2 years given her risk factors, her disease is likely to progress.  Thank you for allowing [place] to participate in this patient's care.  Adele Barthel, MD [facility] Office: [phone] Pager: [phone]  03/16/2015, 11:28 AM

## 2015-03-22 ENCOUNTER — Encounter: Payer: Self-pay | Admitting: *Deleted

## 2015-03-23 ENCOUNTER — Other Ambulatory Visit: Payer: Self-pay

## 2015-03-23 DIAGNOSIS — Z1231 Encounter for screening mammogram for malignant neoplasm of breast: Secondary | ICD-10-CM

## 2015-03-25 ENCOUNTER — Ambulatory Visit
Admission: RE | Admit: 2015-03-25 | Discharge: 2015-03-25 | Disposition: A | Discharge status: Home / Self Care | Payer: BLUE CROSS/BLUE SHIELD | Source: Ambulatory Visit

## 2015-03-25 DIAGNOSIS — Z1231 Encounter for screening mammogram for malignant neoplasm of breast: Secondary | ICD-10-CM

## 2015-05-20 ENCOUNTER — Encounter: Payer: Self-pay | Admitting: *Deleted

## 2015-06-11 ENCOUNTER — Encounter: Payer: Self-pay | Admitting: Family Medicine

## 2015-06-11 DIAGNOSIS — E669 Obesity, unspecified: Principal | ICD-10-CM

## 2015-06-11 DIAGNOSIS — E1169 Type 2 diabetes mellitus with other specified complication: Secondary | ICD-10-CM

## 2015-06-13 MED ORDER — GLIPIZIDE 5 MG PO TABS: 5.0000 mg | ORAL_TABLET | Freq: Two times a day (BID) | ORAL | Status: DC

## 2015-07-13 ENCOUNTER — Encounter: Payer: Self-pay | Admitting: Family Medicine

## 2015-07-13 ENCOUNTER — Ambulatory Visit (INDEPENDENT_AMBULATORY_CARE_PROVIDER_SITE_OTHER): Payer: BLUE CROSS/BLUE SHIELD | Admitting: Family Medicine

## 2015-07-13 VITALS — BP 120/76 | HR 75 | Temp 98.0°F | Resp 16 | Ht 63.0 in | Wt 225.4 lb

## 2015-07-13 DIAGNOSIS — Z119 Encounter for screening for infectious and parasitic diseases, unspecified: Secondary | ICD-10-CM | POA: Diagnosis not present

## 2015-07-13 DIAGNOSIS — E7800 Pure hypercholesterolemia, unspecified: Secondary | ICD-10-CM

## 2015-07-13 DIAGNOSIS — Z23 Encounter for immunization: Secondary | ICD-10-CM

## 2015-07-13 DIAGNOSIS — Z130 Encounter for screening for diseases of the blood and blood-forming organs and certain disorders involving the immune mechanism: Secondary | ICD-10-CM | POA: Diagnosis not present

## 2015-07-13 DIAGNOSIS — E785 Hyperlipidemia, unspecified: Secondary | ICD-10-CM

## 2015-07-13 DIAGNOSIS — E119 Type 2 diabetes mellitus without complications: Secondary | ICD-10-CM | POA: Diagnosis not present

## 2015-07-13 DIAGNOSIS — Z01810 Encounter for preprocedural cardiovascular examination: Secondary | ICD-10-CM

## 2015-07-13 LAB — CBC
HCT: 31.3 % — ABNORMAL LOW (ref 36.0–46.0)
Hemoglobin: 10 g/dL — ABNORMAL LOW (ref 12.0–15.0)
MCH: 18.9 pg — ABNORMAL LOW (ref 26.0–34.0)
MCHC: 31.9 g/dL (ref 30.0–36.0)
MCV: 59.1 fL — ABNORMAL LOW (ref 78.0–100.0)
Platelets: 352 10*3/uL (ref 150–400)
RBC: 5.3 MIL/uL — ABNORMAL HIGH (ref 3.87–5.11)
RDW: 16.4 % — ABNORMAL HIGH (ref 11.5–15.5)
WBC: 8.6 10*3/uL (ref 4.0–10.5)

## 2015-07-13 LAB — COMPREHENSIVE METABOLIC PANEL
ALT: 15 U/L (ref 6–29)
AST: 12 U/L (ref 10–35)
Albumin: 4.5 g/dL (ref 3.6–5.1)
Alkaline Phosphatase: 78 U/L (ref 33–130)
BUN: 16 mg/dL (ref 7–25)
CO2: 25 mmol/L (ref 20–31)
Calcium: 9.8 mg/dL (ref 8.6–10.4)
Chloride: 101 mmol/L (ref 98–110)
Creat: 0.96 mg/dL (ref 0.50–0.99)
Glucose, Bld: 78 mg/dL (ref 65–99)
Potassium: 4.2 mmol/L (ref 3.5–5.3)
Sodium: 139 mmol/L (ref 135–146)
Total Bilirubin: 0.3 mg/dL (ref 0.2–1.2)
Total Protein: 7.7 g/dL (ref 6.1–8.1)

## 2015-07-13 LAB — HEPATITIS C ANTIBODY: HCV Ab: NEGATIVE

## 2015-07-13 LAB — LIPID PANEL
Cholesterol: 207 mg/dL — ABNORMAL HIGH (ref 125–200)
HDL: 50 mg/dL (ref >=46)
LDL Cholesterol: 137 mg/dL — ABNORMAL HIGH (ref <130)
Total CHOL/HDL Ratio: 4.1 Ratio (ref <=5.0)
Triglycerides: 100 mg/dL (ref <150)
VLDL: 20 mg/dL (ref <30)

## 2015-07-13 LAB — HEMOGLOBIN A1C
Hgb A1c MFr Bld: 6.6 % — ABNORMAL HIGH (ref <5.7)
Mean Plasma Glucose: 143 mg/dL — ABNORMAL HIGH (ref <117)

## 2015-07-13 LAB — HIV ANTIBODY (ROUTINE TESTING W REFLEX): HIV 1&2 Ab, 4th Generation: NONREACTIVE

## 2015-07-13 LAB — MICROALBUMIN, URINE: Microalb, Ur: 0.5 mg/dL (ref <2.0)

## 2015-07-13 MED ORDER — PRAVASTATIN SODIUM 20 MG PO TABS: 20.0000 mg | ORAL_TABLET | Freq: Every day | ORAL | Status: DC

## 2015-07-13 NOTE — Progress Notes (Signed)
[facility] [address] [place] [address] [phone]  Date:  07/13/2015   Name:  Lynn [name]   DOB:  09-17-1956   MRN:  [id number]  PCP:  Lamar Blinks, MD    Chief Complaint: Follow-up and A1c   History of Present Illness:  Lynn [name] is a 59 y.o. very pleasant female patient who presents with the following:  She had a right total knee back in May- this worked well for her, she is going back for the left knee soon  Lab Results  Component Value Date   HGBA1C 8.3* 11/26/2014   She needs an A1c recheck prior to her operation  No CP, no SOB.  She is able to do over 4 mets of exercise without any issues  No history of CAD, she does have HTN however  We started her on trulicity back in February- will see how this is working for her today.  She feels like her glucose has been under better control CXR about 2 years ago, normal  She is fasting today Flu shot done already today  She is seeing her eye doctor regularly- she has had some trouble with floaters recently   Wt Readings from Last 3 Encounters:  07/13/15 225 lb 6.4 oz (102.241 kg)  03/16/15 220 lb (99.791 kg)  03/07/15 223 lb (101.152 kg)     Patient Active Problem List   Diagnosis Date Noted  . Vision, loss, sudden 03/07/2015  . S/P TKR (total knee replacement) using cement 02/18/2015  . Thalassemia 09/02/2013  . Hyperlipidemia LDL goal < 100 08/24/2012  . Diabetes mellitus 07/22/2012  . Hypertension 07/22/2012    Past Medical History  Diagnosis Date  . Arthritis   . Diabetes mellitus without complication   . Ulcer   . Hypertension   . Sleep apnea     use CPAP  . Shortness of breath dyspnea     exertion  . Anemia     thalasemia    Past Surgical History  Procedure Laterality Date  . Carpal tunnel release Right 11/01/2014  . Arthroscopic knee Left 2-16  . Abdominal hysterectomy    . Tonsillectomy    . Total knee arthroplasty Right 02/18/2015    Procedure:  RIGHT TOTAL KNEE ARTHROPLASTY;  Surgeon: Netta Cedars, MD;  Location: [facility];  Service: Orthopedics;  Laterality: Right;    Social History  Substance Use Topics  . Smoking status: Former Smoker -- 0.25 packs/day for 44 years    Quit date: 02/17/2015  . Smokeless tobacco: Never Used  . Alcohol Use: 0.0 oz/week    0 Standard drinks or equivalent per week     Comment: socially    Family History  Problem Relation Age of Onset  . Cancer Mother   . Diabetes Mother   . Hypertension Mother   . Hyperlipidemia Mother   . Cancer Father   . Hyperlipidemia Father   . Mental illness Sister   . Diabetes Sister   . Diabetes Maternal Grandmother   . Diabetes Maternal Grandfather     Allergies  Allergen Reactions  . Penicillins Hives    Medication list has been reviewed and updated.  Current Outpatient Prescriptions on File Prior to Visit  Medication Sig Dispense Refill  . ACCU-CHEK AVIVA PLUS test strip     . blood glucose meter kit and supplies KIT Dispense based on patient and insurance preference. Use up to four times daily as directed. (FOR ICD-9 250.00, 250.01). 1 each  0  . Blood Glucose Monitoring Suppl (ACCU-CHEK AVIVA PLUS) W/DEVICE KIT     . Dulaglutide (TRULICITY) 8.54 OE/7.0JJ SOPN Inject 0.75mg  into the skin weekly (Patient taking differently: Inject 0.75mg  into the skin weekly on Wednesdays) 4 pen 11  . glipiZIDE (GLUCOTROL) 5 MG tablet Take 1 tablet (5 mg total) by mouth 2 (two) times daily before a meal. 90 tablet 3  . losartan-hydrochlorothiazide (HYZAAR) 100-12.5 MG per tablet Take 1 tablet by mouth daily. 90 tablet 3  . metFORMIN (GLUCOPHAGE) 1000 MG tablet Take 1 tablet (1,000 mg total) by mouth 2 (two) times daily with a meal. 180 tablet 3  . methocarbamol (ROBAXIN) 500 MG tablet Take 1 tablet (500 mg total) by mouth 3 (three) times daily as needed. 60 tablet 1  . Multiple Vitamin (MULTIVITAMIN WITH MINERALS) TABS tablet Take 1 tablet by mouth daily.    Marland Kitchen ACCU-CHEK  SOFTCLIX LANCETS lancets     . pravastatin (PRAVACHOL) 20 MG tablet Take 1 tablet (20 mg total) by mouth daily. (Patient not taking: Reported on 07/13/2015) 90 tablet 3   No current facility-administered medications on file prior to visit.    Review of Systems:  As per HPI- otherwise negative.   Physical Examination: Filed Vitals:   07/13/15 0910  BP: 120/76  Pulse: 75  Temp: 98 F (36.7 C)  Resp: 16   Filed Vitals:   07/13/15 0910  Height: 5\' 3"  (1.6 m)  Weight: 225 lb 6.4 oz (102.241 kg)   Body mass index is 39.94 kg/(m^2). Ideal Body Weight: Weight in (lb) to have BMI = 25: 140.8  GEN: WDWN, NAD, Non-toxic, A & O x 3, obese, looks well HEENT: Atraumatic, Normocephalic. Neck supple. No masses, No LAD.  Bilateral TM wnl, oropharynx normal.  PEERL,EOMI.   Ears and Nose: No external deformity. CV: RRR, No M/G/R. No JVD. No thrill. No extra heart sounds. PULM: CTA B, no wheezes, crackles, rhonchi. No retractions. No resp. distress. No accessory muscle use. EXTR: No c/c/e NEURO Normal gait.  PSYCH: Normally interactive. Conversant. Not depressed or anxious appearing.  Calm demeanor.    Assessment and Plan: Pre-operative cardiovascular examination  Flu vaccine need - Plan: Flu Vaccine QUAD 36+ mos IM  Diabetes mellitus without complication - Plan: HM Diabetes Foot Exam, Microalbumin, urine, Comprehensive metabolic panel, Hemoglobin A1c  Dyslipidemia - Plan: Lipid panel  Screening for deficiency anemia - Plan: CBC  Screening examination for infectious disease - Plan: Hepatitis C antibody, HIV antibody  High cholesterol - Plan: pravastatin (PRAVACHOL) 20 MG tablet   Await her A1c and other labs Flu shot today DM maintenance today Will fax in pre-op form for her assuming DM control is ok Signed Lamar Blinks, MD

## 2015-07-13 NOTE — Patient Instructions (Signed)
Great to see you today- I will be in touch with your labs asap Assuming your diabetes is under ok control we can proceed with your knee surgery Take care!

## 2015-08-04 ENCOUNTER — Telehealth: Payer: Self-pay

## 2015-08-04 NOTE — H&P (Signed)
Lynn [name] is an 59 y.o. female.    Chief Complaint: left knee pain  HPI: Pt is a 59 y.o. female complaining of left knee pain for multiple years. Pain had continually increased since the beginning. X-rays in the clinic show end-stage arthritic changes of the left knee. Pt has tried various conservative treatments which have failed to alleviate their symptoms, including injections and therapy. Various options are discussed with the patient. Risks, benefits and expectations were discussed with the patient. Patient understand the risks, benefits and expectations and wishes to proceed with surgery.   PCP:  Lamar Blinks, MD  D/C Plans: Home  PMH: Past Medical History  Diagnosis Date  . Arthritis   . Diabetes mellitus without complication   . Ulcer   . Hypertension   . Sleep apnea     use CPAP  . Shortness of breath dyspnea     exertion  . Anemia     thalasemia    PSH: Past Surgical History  Procedure Laterality Date  . Carpal tunnel release Right 11/01/2014  . Arthroscopic knee Left 2-16  . Abdominal hysterectomy    . Tonsillectomy    . Total knee arthroplasty Right 02/18/2015    Procedure: RIGHT TOTAL KNEE ARTHROPLASTY;  Surgeon: Netta Cedars, MD;  Location: [facility];  Service: Orthopedics;  Laterality: Right;    Social History:  reports that she quit smoking about 5 months ago. She has never used smokeless tobacco. She reports that she drinks alcohol. She reports that she does not use illicit drugs.  Allergies:  Allergies  Allergen Reactions  . Penicillins Hives    Medications: No current facility-administered medications for this encounter.   Current Outpatient Prescriptions  Medication Sig Dispense Refill  . ACCU-CHEK AVIVA PLUS test strip     . ACCU-CHEK SOFTCLIX LANCETS lancets     . blood glucose meter kit and supplies KIT Dispense based on patient and insurance preference. Use up to four times daily as directed. (FOR ICD-9 250.00, 250.01). 1 each 0  .  Blood Glucose Monitoring Suppl (ACCU-CHEK AVIVA PLUS) W/DEVICE KIT     . Dulaglutide (TRULICITY) 3.73 SK/8.7GO SOPN Inject 0.57m into the skin weekly (Patient taking differently: Inject 0.750minto the skin weekly on Wednesdays) 4 pen 11  . glipiZIDE (GLUCOTROL) 5 MG tablet Take 1 tablet (5 mg total) by mouth 2 (two) times daily before a meal. 90 tablet 3  . losartan-hydrochlorothiazide (HYZAAR) 100-12.5 MG per tablet Take 1 tablet by mouth daily. 90 tablet 3  . metFORMIN (GLUCOPHAGE) 1000 MG tablet Take 1 tablet (1,000 mg total) by mouth 2 (two) times daily with a meal. 180 tablet 3  . methocarbamol (ROBAXIN) 500 MG tablet Take 1 tablet (500 mg total) by mouth 3 (three) times daily as needed. 60 tablet 1  . Multiple Vitamin (MULTIVITAMIN WITH MINERALS) TABS tablet Take 1 tablet by mouth daily.    . pravastatin (PRAVACHOL) 20 MG tablet Take 1 tablet (20 mg total) by mouth daily. 90 tablet 3    No results found for this or any previous visit (from the past 48 hour(s)). No results found.  ROS: Pain with rom of the left lower extremity  Physical Exam:  Alert and oriented [age].o. female in no acute distress Cranial nerves 2-12 intact Cervical spine: full rom with no tenderness, nv intact distally Chest: active breath sounds bilaterally, no wheeze rhonchi or rales Heart: regular rate and rhythm, no murmur Abd: non tender non distended with active bowel sounds Hip is  stable with rom  Left knee with moderate joint line tenderness with palpation nv intact distally Crepitus with rom No rashes  Antalgic gait  Assessment/Plan Assessment: left knee end stage ostoearthritis  Plan: Patient will undergo a left total knee arthroplasty by Dr. Veverly Fells at [facility]. Risks benefits and expectations were discussed with the patient. Patient understand risks, benefits and expectations and wishes to proceed.

## 2015-08-04 NOTE — Telephone Encounter (Signed)
[employer] called to check on the status of this patient's records, after viewing the chart it looks like we are waiting on payment for the records. Paperwork in is in the bottom draw under the last name.

## 2015-08-06 NOTE — Pre-Procedure Instructions (Addendum)
Lynn [name]  08/06/2015      Your procedure is scheduled on November 4  Report to [facility] Admitting at 11:30 A.M.  Call this number if you have problems the morning of surgery:  [phone]   Remember:  Do not eat food or drink liquids after midnight.  Take these medicines the morning of surgery with A SIP OF WATER None   STOP Multiple Vitamins, Naproxen October 28   STOP/ Do not take Aspirin, Aleve, Naproxen, Advil, Ibuprofen, Motrin, Vitamins, Herbs, or Supplements starting October 28  How to Manage Your Diabetes Before Surgery   Why is it important to control my blood sugar before and after surgery?   Improving blood sugar levels before and after surgery helps healing and can limit problems.  A way of improving blood sugar control is eating a healthy diet by:  - Eating less sugar and carbohydrates  - Increasing activity/exercise  - Talk with your doctor about reaching your blood sugar goals  High blood sugars (greater than 180 mg/dL) can raise your risk of infections and slow down your recovery so you will need to focus on controlling your diabetes during the weeks before surgery.  Make sure that the doctor who takes care of your diabetes knows about your planned surgery including the date and location.  How do I manage my blood sugars before surgery?   Check your blood sugar at least 4 times a day, 2 days before surgery to make sure that they are not too high or low.   Check your blood sugar the morning of your surgery when you wake up and every 2 hours until you get to the Short-Stay unit.  If your blood sugar is less than 70 mg/dL, you will need to treat for low blood sugar by:  Treat a low blood sugar (less than 70 mg/dL) with 1/2 cup of clear juice (cranberry or apple), 4 glucose tablets, OR glucose gel.  Recheck blood sugar in 15 minutes after treatment (to make sure it is greater than 70 mg/dL).  If blood sugar is not greater than 70  mg/dL on re-check, call [phone] for further instructions.   Report your blood sugar to the Short-Stay nurse when you get to Short-Stay.  References:  [facility], 2007 "How to Manage your Diabetes Before and After Surgery".  What do I do about my diabetes medications?   Do not take oral diabetes medicines (pills) the morning of surgery.(metformin,glipizide)    Do not take other diabetes injectables the day of surgery including Byetta, Victoza, Bydureon, and Trulicity.   Do not wear jewelry, make-up or nail polish.  Do not wear lotions, powders, or perfumes.  You may wear deodorant.  Do not shave 48 hours prior to surgery.  Men may shave face and neck.  Do not bring valuables to the hospital.  [facility] is not responsible for any belongings or valuables.  Contacts, dentures or bridgework may not be worn into surgery.  Leave your suitcase in the car.  After surgery it may be brought to your room.  For patients admitted to the hospital, discharge time will be determined by your treatment team.  Patients discharged the day of surgery will not be allowed to drive home.   [facility] - Preparing for Surgery  Before surgery, you can play an important role.  Because skin is not sterile, your skin needs to be as free of germs as possible.  You can  reduce the number of germs on you skin by washing with CHG (chlorahexidine gluconate) soap before surgery.  CHG is an antiseptic cleaner which kills germs and bonds with the skin to continue killing germs even after washing.  Please DO NOT use if you have an allergy to CHG or antibacterial soaps.  If your skin becomes reddened/irritated stop using the CHG and inform your nurse when you arrive at Short Stay.  Do not shave (including legs and underarms) for at least 48 hours prior to the first CHG shower.  You may shave your face.  Please follow these instructions carefully:   1.  Shower with CHG Soap the night  before surgery and the morning of Surgery.  2.  If you choose to wash your hair, wash your hair first as usual with your normal shampoo.  3.  After you shampoo, rinse your hair and body thoroughly to remove the shampoo.  4.  Use CHG as you would any other liquid soap.  You can apply CHG directly to the skin and wash gently with scrungie or a clean washcloth.  5.  Apply the CHG Soap to your body ONLY FROM THE NECK DOWN.  Do not use on open wounds or open sores.  Avoid contact with your eyes, ears, mouth and genitals (private parts).  Wash genitals (private parts) with your normal soap.  6.  Wash thoroughly, paying special attention to the area where your surgery will be performed.  7.  Thoroughly rinse your body with warm water from the neck down.  8.  DO NOT shower/wash with your normal soap after using and rinsing off the CHG Soap.  9.  Pat yourself dry with a clean towel.            10.  Wear clean pajamas.            11.  Place clean sheets on your bed the night of your first shower and do not sleep with pets.  Day of Surgery  Do not apply any lotions the morning of surgery.  Please wear clean clothes to the hospital/surgery center.  Please read over the following fact sheets that you were given. Pain Booklet, Coughing and Deep Breathing, Total Joint Packet and Surgical Site Infection Prevention

## 2015-08-07 ENCOUNTER — Encounter: Payer: Self-pay | Admitting: Family Medicine

## 2015-08-08 ENCOUNTER — Encounter (HOSPITAL_COMMUNITY): Payer: Self-pay

## 2015-08-08 ENCOUNTER — Other Ambulatory Visit: Payer: Self-pay

## 2015-08-08 ENCOUNTER — Encounter (HOSPITAL_COMMUNITY)
Admission: RE | Admit: 2015-08-08 | Discharge: 2015-08-08 | Disposition: A | Discharge status: Home / Self Care | Payer: BLUE CROSS/BLUE SHIELD | Source: Ambulatory Visit | Attending: Orthopedic Surgery | Admitting: Orthopedic Surgery

## 2015-08-08 DIAGNOSIS — Z01812 Encounter for preprocedural laboratory examination: Secondary | ICD-10-CM | POA: Diagnosis not present

## 2015-08-08 DIAGNOSIS — M179 Osteoarthritis of knee, unspecified: Secondary | ICD-10-CM | POA: Insufficient documentation

## 2015-08-08 DIAGNOSIS — E7800 Pure hypercholesterolemia, unspecified: Secondary | ICD-10-CM

## 2015-08-08 HISTORY — DX: Other specified disorders of eye and adnexa: H57.89

## 2015-08-08 LAB — CBC
HCT: 32.7 % — ABNORMAL LOW (ref 36.0–46.0)
Hemoglobin: 10.1 g/dL — ABNORMAL LOW (ref 12.0–15.0)
MCH: 19.1 pg — ABNORMAL LOW (ref 26.0–34.0)
MCHC: 30.9 g/dL (ref 30.0–36.0)
MCV: 61.7 fL — ABNORMAL LOW (ref 78.0–100.0)
Platelets: 293 10*3/uL (ref 150–400)
RBC: 5.3 MIL/uL — ABNORMAL HIGH (ref 3.87–5.11)
RDW: 15.3 % (ref 11.5–15.5)
WBC: 7.3 10*3/uL (ref 4.0–10.5)

## 2015-08-08 LAB — BASIC METABOLIC PANEL
Anion gap: 10 (ref 5–15)
BUN: 14 mg/dL (ref 6–20)
CO2: 24 mmol/L (ref 22–32)
Calcium: 9.6 mg/dL (ref 8.9–10.3)
Chloride: 102 mmol/L (ref 101–111)
Creatinine, Ser: 0.98 mg/dL (ref 0.44–1.00)
GFR calc Af Amer: >60 mL/min (ref >60)
GFR calc non Af Amer: >60 mL/min (ref >60)
Glucose, Bld: 205 mg/dL — ABNORMAL HIGH (ref 65–99)
Potassium: 3.7 mmol/L (ref 3.5–5.1)
Sodium: 136 mmol/L (ref 135–145)

## 2015-08-08 LAB — GLUCOSE, CAPILLARY: Glucose-Capillary: 198 mg/dL — ABNORMAL HIGH (ref 65–99)

## 2015-08-08 LAB — SURGICAL PCR SCREEN
MRSA, PCR: NEGATIVE
Staphylococcus aureus: NEGATIVE

## 2015-08-08 MED ORDER — PRAVASTATIN SODIUM 20 MG PO TABS: 20.0000 mg | ORAL_TABLET | Freq: Every day | ORAL | Status: DC

## 2015-08-08 NOTE — Telephone Encounter (Signed)
Check received and forms faxed to [facility] on 08/08/2015

## 2015-08-08 NOTE — Progress Notes (Signed)
cbg 198 at preadmit

## 2015-08-10 ENCOUNTER — Encounter: Payer: Self-pay | Admitting: Family Medicine

## 2015-08-11 DIAGNOSIS — Z0271 Encounter for disability determination: Secondary | ICD-10-CM

## 2015-08-12 ENCOUNTER — Other Ambulatory Visit: Payer: Self-pay | Admitting: Family Medicine

## 2015-08-17 ENCOUNTER — Other Ambulatory Visit: Payer: Self-pay | Admitting: Family Medicine

## 2015-08-18 MED ORDER — CLINDAMYCIN PHOSPHATE 900 MG/50ML IV SOLN
900.0000 mg | INTRAVENOUS | Status: AC
  Administered 2015-08-19: 900 mg via INTRAVENOUS
  Filled 2015-08-18: qty 50

## 2015-08-18 MED ORDER — CHLORHEXIDINE GLUCONATE 4 % EX LIQD: 60.0000 mL | Freq: Once | CUTANEOUS | Status: DC

## 2015-08-19 ENCOUNTER — Encounter (HOSPITAL_COMMUNITY)
Admission: RE | Disposition: A | Discharge status: Home Health | Payer: Self-pay | Source: Ambulatory Visit | Attending: Orthopedic Surgery

## 2015-08-19 ENCOUNTER — Inpatient Hospital Stay (HOSPITAL_COMMUNITY): Payer: BLUE CROSS/BLUE SHIELD | Admitting: Certified Registered"

## 2015-08-19 ENCOUNTER — Inpatient Hospital Stay (HOSPITAL_COMMUNITY)
Admission: RE | Admit: 2015-08-19 | Discharge: 2015-08-22 | DRG: 470 | Disposition: A | Discharge status: Home Health | Payer: BLUE CROSS/BLUE SHIELD | Source: Ambulatory Visit | Attending: Orthopedic Surgery | Admitting: Orthopedic Surgery

## 2015-08-19 ENCOUNTER — Encounter (HOSPITAL_COMMUNITY): Payer: Self-pay

## 2015-08-19 DIAGNOSIS — Z79899 Other long term (current) drug therapy: Secondary | ICD-10-CM | POA: Diagnosis not present

## 2015-08-19 DIAGNOSIS — Z880 Allergy status to penicillin: Secondary | ICD-10-CM | POA: Diagnosis not present

## 2015-08-19 DIAGNOSIS — D62 Acute posthemorrhagic anemia: Secondary | ICD-10-CM | POA: Diagnosis not present

## 2015-08-19 DIAGNOSIS — Z90710 Acquired absence of both cervix and uterus: Secondary | ICD-10-CM | POA: Diagnosis not present

## 2015-08-19 DIAGNOSIS — E119 Type 2 diabetes mellitus without complications: Secondary | ICD-10-CM | POA: Diagnosis present

## 2015-08-19 DIAGNOSIS — Z87891 Personal history of nicotine dependence: Secondary | ICD-10-CM

## 2015-08-19 DIAGNOSIS — M1712 Unilateral primary osteoarthritis, left knee: Secondary | ICD-10-CM | POA: Diagnosis present

## 2015-08-19 DIAGNOSIS — G4730 Sleep apnea, unspecified: Secondary | ICD-10-CM | POA: Diagnosis present

## 2015-08-19 DIAGNOSIS — I10 Essential (primary) hypertension: Secondary | ICD-10-CM | POA: Diagnosis present

## 2015-08-19 DIAGNOSIS — D649 Anemia, unspecified: Secondary | ICD-10-CM | POA: Diagnosis present

## 2015-08-19 DIAGNOSIS — Z96659 Presence of unspecified artificial knee joint: Secondary | ICD-10-CM

## 2015-08-19 DIAGNOSIS — Z7984 Long term (current) use of oral hypoglycemic drugs: Secondary | ICD-10-CM | POA: Diagnosis not present

## 2015-08-19 DIAGNOSIS — Z96651 Presence of right artificial knee joint: Secondary | ICD-10-CM | POA: Diagnosis present

## 2015-08-19 HISTORY — PX: TOTAL KNEE ARTHROPLASTY: SHX125

## 2015-08-19 LAB — CREATININE, SERUM
Creatinine, Ser: 1.2 mg/dL — ABNORMAL HIGH (ref 0.44–1.00)
GFR calc Af Amer: 55 mL/min — ABNORMAL LOW (ref >60)
GFR calc non Af Amer: 48 mL/min — ABNORMAL LOW (ref >60)

## 2015-08-19 LAB — CBC
HCT: 28.7 % — ABNORMAL LOW (ref 36.0–46.0)
Hemoglobin: 8.8 g/dL — ABNORMAL LOW (ref 12.0–15.0)
MCH: 18.8 pg — ABNORMAL LOW (ref 26.0–34.0)
MCHC: 30.7 g/dL (ref 30.0–36.0)
MCV: 61.3 fL — ABNORMAL LOW (ref 78.0–100.0)
Platelets: 277 10*3/uL (ref 150–400)
RBC: 4.68 MIL/uL (ref 3.87–5.11)
RDW: 15.3 % (ref 11.5–15.5)
WBC: 11.2 10*3/uL — ABNORMAL HIGH (ref 4.0–10.5)

## 2015-08-19 LAB — GLUCOSE, CAPILLARY
Glucose-Capillary: 153 mg/dL — ABNORMAL HIGH (ref 65–99)
Glucose-Capillary: 113 mg/dL — ABNORMAL HIGH (ref 65–99)
Glucose-Capillary: 217 mg/dL — ABNORMAL HIGH (ref 65–99)

## 2015-08-19 SURGERY — ARTHROPLASTY, KNEE, TOTAL
Anesthesia: Regional | Site: Knee | Laterality: Left

## 2015-08-19 MED ORDER — METHOCARBAMOL 500 MG PO TABS
500.0000 mg | ORAL_TABLET | Freq: Four times a day (QID) | ORAL | Status: DC | PRN
  Administered 2015-08-20 – 2015-08-22 (×7): 500 mg via ORAL
  Filled 2015-08-19 (×7): qty 1

## 2015-08-19 MED ORDER — OXYCODONE-ACETAMINOPHEN 5-325 MG PO TABS: 1.0000 | ORAL_TABLET | ORAL | Status: DC | PRN

## 2015-08-19 MED ORDER — SODIUM CHLORIDE 0.9 % IR SOLN
Status: DC | PRN
  Administered 2015-08-19: 3000 mL

## 2015-08-19 MED ORDER — ONDANSETRON HCL 4 MG/2ML IJ SOLN
4.0000 mg | Freq: Four times a day (QID) | INTRAMUSCULAR | Status: DC | PRN
  Administered 2015-08-19 – 2015-08-20 (×2): 4 mg via INTRAVENOUS
  Filled 2015-08-19 (×3): qty 2

## 2015-08-19 MED ORDER — ACETAMINOPHEN 650 MG RE SUPP: 650.0000 mg | Freq: Four times a day (QID) | RECTAL | Status: DC | PRN

## 2015-08-19 MED ORDER — HYDROMORPHONE HCL 1 MG/ML IJ SOLN
1.0000 mg | INTRAMUSCULAR | Status: DC | PRN
  Administered 2015-08-19: 1 mg via INTRAVENOUS
  Filled 2015-08-19: qty 1

## 2015-08-19 MED ORDER — HYDROCHLOROTHIAZIDE 12.5 MG PO CAPS
12.5000 mg | ORAL_CAPSULE | Freq: Every day | ORAL | Status: DC
  Administered 2015-08-20 – 2015-08-22 (×3): 12.5 mg via ORAL
  Filled 2015-08-19 (×3): qty 1

## 2015-08-19 MED ORDER — PHENYLEPHRINE HCL 10 MG/ML IJ SOLN
10.0000 mg | INTRAVENOUS | Status: DC | PRN
  Administered 2015-08-19: 25 ug/min via INTRAVENOUS

## 2015-08-19 MED ORDER — METOCLOPRAMIDE HCL 5 MG/ML IJ SOLN
5.0000 mg | Freq: Three times a day (TID) | INTRAMUSCULAR | Status: DC | PRN
  Administered 2015-08-20: 10 mg via INTRAVENOUS
  Filled 2015-08-19: qty 2

## 2015-08-19 MED ORDER — ENOXAPARIN SODIUM 30 MG/0.3ML ~~LOC~~ SOLN
30.0000 mg | Freq: Two times a day (BID) | SUBCUTANEOUS | Status: DC
  Administered 2015-08-20 – 2015-08-22 (×5): 30 mg via SUBCUTANEOUS
  Filled 2015-08-19 (×5): qty 0.3

## 2015-08-19 MED ORDER — MIDAZOLAM HCL 5 MG/5ML IJ SOLN
INTRAMUSCULAR | Status: DC | PRN
  Administered 2015-08-19 (×2): 1 mg via INTRAVENOUS

## 2015-08-19 MED ORDER — WARFARIN SODIUM 5 MG PO TABS: 5.0000 mg | ORAL_TABLET | Freq: Every day | ORAL | Status: DC

## 2015-08-19 MED ORDER — HYDROMORPHONE HCL 1 MG/ML IJ SOLN: 0.2500 mg | INTRAMUSCULAR | Status: DC | PRN

## 2015-08-19 MED ORDER — DULAGLUTIDE 0.75 MG/0.5ML ~~LOC~~ SOAJ: 0.7500 mg | SUBCUTANEOUS | Status: DC

## 2015-08-19 MED ORDER — SUGAMMADEX SODIUM 500 MG/5ML IV SOLN
INTRAVENOUS | Status: AC
  Filled 2015-08-19: qty 5

## 2015-08-19 MED ORDER — PHENYLEPHRINE HCL 10 MG/ML IJ SOLN
INTRAMUSCULAR | Status: DC | PRN
Start: 2015-08-19 — End: 2015-08-19
  Administered 2015-08-19 (×4): 80 ug via INTRAVENOUS

## 2015-08-19 MED ORDER — ADULT MULTIVITAMIN W/MINERALS CH
1.0000 | ORAL_TABLET | Freq: Every day | ORAL | Status: DC
  Administered 2015-08-20 – 2015-08-22 (×3): 1 via ORAL
  Filled 2015-08-19 (×3): qty 1

## 2015-08-19 MED ORDER — WARFARIN - PHARMACIST DOSING INPATIENT: Freq: Every day | Status: DC

## 2015-08-19 MED ORDER — WARFARIN SODIUM 7.5 MG PO TABS
7.5000 mg | ORAL_TABLET | Freq: Once | ORAL | Status: AC
  Administered 2015-08-19: 7.5 mg via ORAL
  Filled 2015-08-19: qty 1

## 2015-08-19 MED ORDER — INSULIN ASPART 100 UNIT/ML ~~LOC~~ SOLN
6.0000 [IU] | Freq: Three times a day (TID) | SUBCUTANEOUS | Status: DC
  Administered 2015-08-20 – 2015-08-22 (×6): 6 [IU] via SUBCUTANEOUS

## 2015-08-19 MED ORDER — OXYCODONE HCL 5 MG PO TABS
5.0000 mg | ORAL_TABLET | ORAL | Status: DC | PRN
  Administered 2015-08-19 – 2015-08-22 (×16): 10 mg via ORAL
  Filled 2015-08-19 (×17): qty 2

## 2015-08-19 MED ORDER — CLINDAMYCIN PHOSPHATE 600 MG/50ML IV SOLN
600.0000 mg | Freq: Four times a day (QID) | INTRAVENOUS | Status: AC
  Administered 2015-08-19 – 2015-08-20 (×2): 600 mg via INTRAVENOUS
  Filled 2015-08-19 (×4): qty 50

## 2015-08-19 MED ORDER — FENTANYL CITRATE (PF) 100 MCG/2ML IJ SOLN
INTRAMUSCULAR | Status: AC
  Administered 2015-08-19: 100 ug
  Filled 2015-08-19: qty 2

## 2015-08-19 MED ORDER — INSULIN ASPART 100 UNIT/ML ~~LOC~~ SOLN
0.0000 [IU] | Freq: Three times a day (TID) | SUBCUTANEOUS | Status: DC
  Administered 2015-08-20: 3 [IU] via SUBCUTANEOUS
  Administered 2015-08-20 (×2): 4 [IU] via SUBCUTANEOUS
  Administered 2015-08-21: 0 [IU] via SUBCUTANEOUS
  Administered 2015-08-21: 3 [IU] via SUBCUTANEOUS
  Administered 2015-08-21: 6 [IU] via SUBCUTANEOUS
  Administered 2015-08-22: 4 [IU] via SUBCUTANEOUS

## 2015-08-19 MED ORDER — POLYETHYLENE GLYCOL 3350 17 G PO PACK: 17.0000 g | PACK | Freq: Every day | ORAL | Status: DC | PRN

## 2015-08-19 MED ORDER — METOCLOPRAMIDE HCL 5 MG PO TABS: 5.0000 mg | ORAL_TABLET | Freq: Three times a day (TID) | ORAL | Status: DC | PRN

## 2015-08-19 MED ORDER — BUPIVACAINE-EPINEPHRINE (PF) 0.5% -1:200000 IJ SOLN
INTRAMUSCULAR | Status: DC | PRN
  Administered 2015-08-19: 30 mL

## 2015-08-19 MED ORDER — FERROUS SULFATE 325 (65 FE) MG PO TABS
325.0000 mg | ORAL_TABLET | Freq: Three times a day (TID) | ORAL | Status: DC
  Administered 2015-08-20 – 2015-08-22 (×7): 325 mg via ORAL
  Filled 2015-08-19 (×7): qty 1

## 2015-08-19 MED ORDER — BISACODYL 10 MG RE SUPP: 10.0000 mg | Freq: Every day | RECTAL | Status: DC | PRN

## 2015-08-19 MED ORDER — LACTATED RINGERS IV SOLN: INTRAVENOUS | Status: DC

## 2015-08-19 MED ORDER — BUPIVACAINE IN DEXTROSE 0.75-8.25 % IT SOLN
INTRATHECAL | Status: DC | PRN
  Administered 2015-08-19: 15 mg via INTRATHECAL

## 2015-08-19 MED ORDER — LACTATED RINGERS IV SOLN
INTRAVENOUS | Status: DC | PRN
  Administered 2015-08-19 (×2): via INTRAVENOUS

## 2015-08-19 MED ORDER — ONDANSETRON HCL 4 MG PO TABS
4.0000 mg | ORAL_TABLET | Freq: Four times a day (QID) | ORAL | Status: DC | PRN
  Administered 2015-08-20: 4 mg via ORAL
  Filled 2015-08-19: qty 1

## 2015-08-19 MED ORDER — GLIPIZIDE 5 MG PO TABS
5.0000 mg | ORAL_TABLET | Freq: Two times a day (BID) | ORAL | Status: DC
  Administered 2015-08-20 – 2015-08-22 (×5): 5 mg via ORAL
  Filled 2015-08-19 (×5): qty 1

## 2015-08-19 MED ORDER — PROPOFOL 500 MG/50ML IV EMUL
INTRAVENOUS | Status: DC | PRN
  Administered 2015-08-19: 50 ug/kg/min via INTRAVENOUS

## 2015-08-19 MED ORDER — INSULIN ASPART 100 UNIT/ML ~~LOC~~ SOLN
0.0000 [IU] | Freq: Every day | SUBCUTANEOUS | Status: DC
  Administered 2015-08-20: 2 [IU] via SUBCUTANEOUS

## 2015-08-19 MED ORDER — 0.9 % SODIUM CHLORIDE (POUR BTL) OPTIME
TOPICAL | Status: DC | PRN
  Administered 2015-08-19: 1000 mL

## 2015-08-19 MED ORDER — LOSARTAN POTASSIUM 50 MG PO TABS
100.0000 mg | ORAL_TABLET | Freq: Every day | ORAL | Status: DC
  Administered 2015-08-20 – 2015-08-22 (×3): 100 mg via ORAL
  Filled 2015-08-19 (×3): qty 2

## 2015-08-19 MED ORDER — ACETAMINOPHEN 325 MG PO TABS
650.0000 mg | ORAL_TABLET | Freq: Four times a day (QID) | ORAL | Status: DC | PRN
  Administered 2015-08-21 – 2015-08-22 (×2): 650 mg via ORAL
  Filled 2015-08-19 (×4): qty 2

## 2015-08-19 MED ORDER — PRAVASTATIN SODIUM 20 MG PO TABS: 20.0000 mg | ORAL_TABLET | Freq: Every day | ORAL | Status: DC

## 2015-08-19 MED ORDER — PHENOL 1.4 % MT LIQD: 1.0000 | OROMUCOSAL | Status: DC | PRN

## 2015-08-19 MED ORDER — SODIUM CHLORIDE 0.9 % IV SOLN
INTRAVENOUS | Status: DC
  Administered 2015-08-19: 18:00:00 via INTRAVENOUS

## 2015-08-19 MED ORDER — DOCUSATE SODIUM 100 MG PO CAPS
100.0000 mg | ORAL_CAPSULE | Freq: Two times a day (BID) | ORAL | Status: DC
  Administered 2015-08-20 – 2015-08-22 (×6): 100 mg via ORAL
  Filled 2015-08-19 (×6): qty 1

## 2015-08-19 MED ORDER — MENTHOL 3 MG MT LOZG: 1.0000 | LOZENGE | OROMUCOSAL | Status: DC | PRN

## 2015-08-19 MED ORDER — LOSARTAN POTASSIUM-HCTZ 100-12.5 MG PO TABS: 1.0000 | ORAL_TABLET | Freq: Every day | ORAL | Status: DC

## 2015-08-19 MED ORDER — FENTANYL CITRATE (PF) 250 MCG/5ML IJ SOLN
INTRAMUSCULAR | Status: AC
  Filled 2015-08-19: qty 5

## 2015-08-19 MED ORDER — METHOCARBAMOL 500 MG PO TABS: 500.0000 mg | ORAL_TABLET | Freq: Three times a day (TID) | ORAL | Status: DC | PRN

## 2015-08-19 MED ORDER — MIDAZOLAM HCL 2 MG/2ML IJ SOLN
INTRAMUSCULAR | Status: AC
  Filled 2015-08-19: qty 4

## 2015-08-19 MED ORDER — METHOCARBAMOL 500 MG PO TABS: 500.0000 mg | ORAL_TABLET | Freq: Four times a day (QID) | ORAL | Status: DC | PRN

## 2015-08-19 MED ORDER — MIDAZOLAM HCL 2 MG/2ML IJ SOLN
INTRAMUSCULAR | Status: AC
  Administered 2015-08-19: 2 mg
  Filled 2015-08-19: qty 2

## 2015-08-19 MED ORDER — METHOCARBAMOL 1000 MG/10ML IJ SOLN
500.0000 mg | Freq: Four times a day (QID) | INTRAMUSCULAR | Status: DC | PRN
  Filled 2015-08-19: qty 5

## 2015-08-19 MED ORDER — FENTANYL CITRATE (PF) 250 MCG/5ML IJ SOLN
INTRAMUSCULAR | Status: DC | PRN
  Administered 2015-08-19: 50 ug via INTRAVENOUS

## 2015-08-19 MED ORDER — METFORMIN HCL 500 MG PO TABS
1000.0000 mg | ORAL_TABLET | Freq: Two times a day (BID) | ORAL | Status: DC
  Administered 2015-08-20 – 2015-08-22 (×5): 1000 mg via ORAL
  Filled 2015-08-19 (×5): qty 2

## 2015-08-19 SURGICAL SUPPLY — 57 items
BANDAGE ESMARK 6X9 LF (GAUZE/BANDAGES/DRESSINGS) ×1 IMPLANT
BLADE SAG 18X100X1.27 (BLADE) ×2 IMPLANT
BLADE SAW SGTL 13.0X1.19X90.0M (BLADE) ×2 IMPLANT
BNDG CMPR 9X6 STRL LF SNTH (GAUZE/BANDAGES/DRESSINGS) ×1
BNDG CMPR MED 10X6 ELC LF (GAUZE/BANDAGES/DRESSINGS) ×1
BNDG ELASTIC 6X10 VLCR STRL LF (GAUZE/BANDAGES/DRESSINGS) ×2 IMPLANT
BNDG ESMARK 6X9 LF (GAUZE/BANDAGES/DRESSINGS) ×2
BNDG GAUZE ELAST 4 BULKY (GAUZE/BANDAGES/DRESSINGS) ×4 IMPLANT
BOWL SMART MIX CTS (DISPOSABLE) ×2 IMPLANT
CAP KNEE TOTAL 3 SIGMA ×1 IMPLANT
CEMENT HV SMART SET (Cement) ×4 IMPLANT
COVER SURGICAL LIGHT HANDLE (MISCELLANEOUS) ×2 IMPLANT
CUFF TOURNIQUET SINGLE 34IN LL (TOURNIQUET CUFF) ×1 IMPLANT
CUFF TOURNIQUET SINGLE 44IN (TOURNIQUET CUFF) IMPLANT
DRAPE EXTREMITY T 121X128X90 (DRAPE) ×2 IMPLANT
DRAPE IMP U-DRAPE 54X76 (DRAPES) ×2 IMPLANT
DRAPE PROXIMA HALF (DRAPES) ×2 IMPLANT
DRAPE U-SHAPE 47X51 STRL (DRAPES) ×2 IMPLANT
DRSG ADAPTIC 3X8 NADH LF (GAUZE/BANDAGES/DRESSINGS) ×2 IMPLANT
DRSG PAD ABDOMINAL 8X10 ST (GAUZE/BANDAGES/DRESSINGS) ×4 IMPLANT
DURAPREP 26ML APPLICATOR (WOUND CARE) ×2 IMPLANT
ELECT CAUTERY BLADE 6.4 (BLADE) ×2 IMPLANT
ELECT REM PT RETURN 9FT ADLT (ELECTROSURGICAL) ×2
ELECTRODE REM PT RTRN 9FT ADLT (ELECTROSURGICAL) ×1 IMPLANT
GAUZE SPONGE 4X4 12PLY STRL (GAUZE/BANDAGES/DRESSINGS) ×2 IMPLANT
GLOVE BIOGEL PI ORTHO PRO 7.5 (GLOVE) ×1
GLOVE BIOGEL PI ORTHO PRO SZ8 (GLOVE) ×1
GLOVE ORTHO TXT STRL SZ7.5 (GLOVE) ×2 IMPLANT
GLOVE PI ORTHO PRO STRL 7.5 (GLOVE) ×1 IMPLANT
GLOVE PI ORTHO PRO STRL SZ8 (GLOVE) ×1 IMPLANT
GLOVE SURG ORTHO 8.5 STRL (GLOVE) ×2 IMPLANT
GOWN STRL REUS W/ TWL XL LVL3 (GOWN DISPOSABLE) ×3 IMPLANT
GOWN STRL REUS W/TWL XL LVL3 (GOWN DISPOSABLE) ×6
HANDPIECE INTERPULSE COAX TIP (DISPOSABLE) ×2
IMMOBILIZER KNEE 22 UNIV (SOFTGOODS) ×1 IMPLANT
KIT BASIN OR (CUSTOM PROCEDURE TRAY) ×2 IMPLANT
KIT MANIFOLD (MISCELLANEOUS) ×2 IMPLANT
KIT ROOM TURNOVER OR (KITS) ×2 IMPLANT
MANIFOLD NEPTUNE II (INSTRUMENTS) ×2 IMPLANT
NS IRRIG 1000ML POUR BTL (IV SOLUTION) ×2 IMPLANT
PACK TOTAL JOINT (CUSTOM PROCEDURE TRAY) ×2 IMPLANT
PACK UNIVERSAL I (CUSTOM PROCEDURE TRAY) ×2 IMPLANT
PAD ARMBOARD 7.5X6 YLW CONV (MISCELLANEOUS) ×4 IMPLANT
SET HNDPC FAN SPRY TIP SCT (DISPOSABLE) ×1 IMPLANT
STRIP CLOSURE SKIN 1/2X4 (GAUZE/BANDAGES/DRESSINGS) ×4 IMPLANT
SUCTION FRAZIER TIP 10 FR DISP (SUCTIONS) ×2 IMPLANT
SUT MNCRL AB 3-0 PS2 18 (SUTURE) ×2 IMPLANT
SUT VIC AB 0 CT1 27 (SUTURE) ×4
SUT VIC AB 0 CT1 27XBRD ANBCTR (SUTURE) ×2 IMPLANT
SUT VIC AB 1 CT1 27 (SUTURE) ×6
SUT VIC AB 1 CT1 27XBRD ANBCTR (SUTURE) ×3 IMPLANT
SUT VIC AB 2-0 CT1 27 (SUTURE) ×4
SUT VIC AB 2-0 CT1 TAPERPNT 27 (SUTURE) ×2 IMPLANT
TOWEL OR 17X24 6PK STRL BLUE (TOWEL DISPOSABLE) ×2 IMPLANT
TOWEL OR 17X26 10 PK STRL BLUE (TOWEL DISPOSABLE) ×2 IMPLANT
TRAY FOLEY CATH 16FRSI W/METER (SET/KITS/TRAYS/PACK) ×1 IMPLANT
WATER STERILE IRR 1000ML POUR (IV SOLUTION) ×2 IMPLANT

## 2015-08-19 NOTE — Transfer of Care (Signed)
Immediate Anesthesia Transfer of Care Note  Patient: Lynn [name]  Procedure(s) Performed: Procedure(s): LEFT TOTAL KNEE ARTHROPLASTY (Left)  Patient Location: PACU  Anesthesia Type:MAC  Level of Consciousness: awake, alert  and oriented  Airway & Oxygen Therapy: Patient Spontanous Breathing  Post-op Assessment: Report given to RN and Post -op Vital signs reviewed and stable  Post vital signs: Reviewed and stable  Last Vitals:  Filed Vitals:   08/19/15 1228  BP: 110/44  Pulse:   Temp:   Resp:     Complications: No apparent anesthesia complications

## 2015-08-19 NOTE — Interval H&P Note (Signed)
History and Physical Interval Note:  08/19/2015 12:29 PM  Lynn [name]  has presented today for surgery, with the diagnosis of left knee osteoarthritis  The various methods of treatment have been discussed with the patient and family. After consideration of risks, benefits and other options for treatment, the patient has consented to  Procedure(s): LEFT TOTAL KNEE ARTHROPLASTY (Left) as a surgical intervention .  The patient's history has been reviewed, patient examined, no change in status, stable for surgery.  I have reviewed the patient's chart and labs.  Questions were answered to the patient's satisfaction.     Donyel Castagnola,STEVEN R

## 2015-08-19 NOTE — Progress Notes (Addendum)
ANTICOAGULATION CONSULT NOTE - Initial Consult  Pharmacy Consult for Coumadin Indication: VTE prophylaxis  Allergies  Allergen Reactions  . Penicillins Hives    Has patient had a PCN reaction causing immediate rash, facial/tongue/throat swelling, SOB or lightheadedness with hypotension: Yes Has patient had a PCN reaction causing severe rash involving mucus membranes or skin necrosis: No Has patient had a PCN reaction that required hospitalization No Has patient had a PCN reaction occurring within the last 10 years: No If all of the above answers are "NO", then may proceed with Cephalosporin use.    Patient Measurements: Weight = 102.7kg  Vital Signs: Temp: 98.1 F (36.7 C) (11/04 1742) Temp Source: Oral (11/04 1742) BP: 119/56 mmHg (11/04 1742) Pulse Rate: 84 (11/04 1742)  Labs: No results for input(s): HGB, HCT, PLT, APTT, LABPROT, INR, HEPARINUNFRC, CREATININE, CKTOTAL, CKMB, TROPONINI in the last 72 hours.  Estimated Creatinine Clearance: 70.3 mL/min (by C-G formula based on Cr of 0.98).   Medical History: Past Medical History  Diagnosis Date  . Arthritis   . Diabetes mellitus without complication ([place])   . Ulcer   . Hypertension   . Sleep apnea     use CPAP  . Anemia     thalasemia  . Eye hemorrhage 4/16    resolved rt  . Shortness of breath dyspnea     exertion- none since stopped smoking  . Blood dyscrasia     thalasemia   Assessment: 59yof s/p right TKA back in May, now s/p left TKA to begin coumadin for VTE prophylaxis. She was on coumadin in May for 30 days post-op. She received 2 doses of 7.5mg , 1 dose of 10mg , and was discharged home on 5mg  daily. Follow up INR was 2.1. Per the discharge summary in May goal INR was 2.5-3. Will aim for same INR goal this time.   Goal of Therapy:  INR 2.5-3 Monitor platelets by anticoagulation protocol: Yes   Plan:  1) Coumadin 7.5mg  x 1 2) Daily INR 3) Continue lovenox 30mg  sq q12 until INR therapeutic  Deboraha Sprang 08/19/2015,5:49 PM

## 2015-08-19 NOTE — Brief Op Note (Signed)
08/19/2015  3:22 PM  PATIENT:  Lynn [name]  59 y.o. female  PRE-OPERATIVE DIAGNOSIS:  left knee osteoarthritis, end stage  POST-OPERATIVE DIAGNOSIS:  left knee osteoarthritis, end stage  PROCEDURE:  Procedure(s): LEFT TOTAL KNEE ARTHROPLASTY (Left) DePuy Sigma RP  SURGEON:  Surgeon(s) and Role:    * Netta Cedars, MD - Primary  PHYSICIAN ASSISTANT:   ASSISTANTS: Ventura Bruns, PA-C   ANESTHESIA:   regional and spinal  EBL:  Total I/O In: 1200 [I.V.:1200] Out: 60 [Urine:60]  BLOOD ADMINISTERED:none  DRAINS: none   LOCAL MEDICATIONS USED:  NONE  SPECIMEN:  No Specimen  DISPOSITION OF SPECIMEN:  N/A  COUNTS:  YES  TOURNIQUET:   Total Tourniquet Time Documented: Thigh (Left) - 105 minutes Total: Thigh (Left) - 105 minutes   DICTATION: .Other Dictation: Dictation Number [id number]  PLAN OF CARE: Admit to inpatient   PATIENT DISPOSITION:  PACU - hemodynamically stable.   Delay start of Pharmacological VTE agent (>24hrs) due to surgical blood loss or risk of bleeding: no

## 2015-08-19 NOTE — Anesthesia Postprocedure Evaluation (Signed)
  Anesthesia Post-op Note  Patient: Lynn [name]  Procedure(s) Performed: Procedure(s): LEFT TOTAL KNEE ARTHROPLASTY (Left)  Patient Location: PACU  Anesthesia Type: Spinal, Regional   Level of Consciousness: awake, alert  and oriented  Airway and Oxygen Therapy: Patient Spontanous Breathing  Post-op Pain: mild  Post-op Assessment: Post-op Vital signs reviewed  Post-op Vital Signs: Reviewed  Last Vitals:  Filed Vitals:   08/19/15 1615  BP:   Pulse: 74  Temp: 36.4 C  Resp: 15    Complications: No apparent anesthesia complications

## 2015-08-19 NOTE — Discharge Instructions (Signed)
Ice to the knee as much as you can.  Keep the leg elevated and prop the ankle or calf.  Do exercises every hour while awake.  Dangle the knees over the bed several times per day.  Ok to weight bear as tolerated on the left knee.  Use the knee immobilizer for sleep at night to maintain knee extension.  CPM 6 hours per day in two hour intervals.  Follow up with Dr Veverly Fells in two weeks [phone]

## 2015-08-19 NOTE — Anesthesia Preprocedure Evaluation (Addendum)
Anesthesia Evaluation  Patient identified by MRN, date of birth, ID band Patient awake    Reviewed: Allergy & Precautions, H&P , NPO status , Patient's Chart, lab work & pertinent test results  Airway Mallampati: II  TM Distance: >3 FB Neck ROM: Full    Dental no notable dental hx. (+) Teeth Intact, Dental Advisory Given   Pulmonary sleep apnea and Continuous Positive Airway Pressure Ventilation , former smoker,    Pulmonary exam normal breath sounds clear to auscultation       Cardiovascular hypertension, Pt. on medications  Rhythm:Regular Rate:Normal     Neuro/Psych negative neurological ROS  negative psych ROS   GI/Hepatic negative GI ROS, Neg liver ROS,   Endo/Other  diabetes, Type 2, Oral Hypoglycemic AgentsMorbid obesity  Renal/GU negative Renal ROS  negative genitourinary   Musculoskeletal  (+) Arthritis , Osteoarthritis,    Abdominal   Peds  Hematology negative hematology ROS (+) Blood dyscrasia, ,   Anesthesia Other Findings   Reproductive/Obstetrics negative OB ROS                            Anesthesia Physical Anesthesia Plan  ASA: III  Anesthesia Plan: Spinal and Regional   Post-op Pain Management: MAC Combined w/ Regional for Post-op pain   Induction: Intravenous  Airway Management Planned: Simple Face Mask  Additional Equipment:   Intra-op Plan:   Post-operative Plan:   Informed Consent: I have reviewed the patients History and Physical, chart, labs and discussed the procedure including the risks, benefits and alternatives for the proposed anesthesia with the patient or authorized representative who has indicated his/her understanding and acceptance.   Dental advisory given  Plan Discussed with: CRNA and Surgeon  Anesthesia Plan Comments:        Anesthesia Quick Evaluation

## 2015-08-19 NOTE — Progress Notes (Signed)
Orthopedic Tech Progress Note Patient Details:  Lynn [name] [birth year] [id number]  CPM Left Knee CPM Left Knee: On Left Knee Flexion (Degrees): 90 Left Knee Extension (Degrees): 0 Additional Comments: applied ohf to bed   Braulio Bosch 08/19/2015, 5:27 PM

## 2015-08-19 NOTE — Anesthesia Procedure Notes (Addendum)
Anesthesia Regional Block:  Femoral nerve block  Pre-Anesthetic Checklist: ,, timeout performed, Correct Patient, Correct Site, Correct Laterality, Correct Procedure, Correct Position, site marked, Risks and benefits discussed, pre-op evaluation,  At surgeon's request and post-op pain management  Laterality: Left  Prep: Maximum Sterile Barrier Precautions used and chloraprep       Needles:  Injection technique: Single-shot  Needle Type: Echogenic Stimulator Needle     Needle Length: 5cm 5 cm Needle Gauge: 22 and 22 G    Additional Needles:  Procedures: ultrasound guided (picture in chart) Femoral nerve block  Nerve Stimulator or Paresthesia:  Response: Patellar respose,   Additional Responses:   Narrative:  Start time: 08/19/2015 12:15 PM End time: 08/19/2015 12:25 PM Injection made incrementally with aspirations every 5 mL. Anesthesiologist: Roderic Palau  Additional Notes: 2% Lidocaine skin wheel.    Spinal Patient location during procedure: OR Start time: 08/19/2015 1:10 PM End time: 08/19/2015 1:15 PM Staffing Anesthesiologist: Roderic Palau Performed by: anesthesiologist  Preanesthetic Checklist Completed: patient identified, surgical consent, pre-op evaluation, timeout performed, IV checked, risks and benefits discussed and monitors and equipment checked Spinal Block Patient position: sitting Prep: ChloraPrep Patient monitoring: cardiac monitor, continuous pulse ox and blood pressure Approach: midline Location: L3-4 Injection technique: single-shot Needle Needle type: Pencan  Needle gauge: 24 G Needle length: 9 cm Assessment Sensory level: T6 Additional Notes Functioning IV was confirmed and monitors were applied. Sterile prep and drape, including hand hygiene and sterile gloves were used. The patient was positioned and the spine was prepped. The skin was anesthetized with lidocaine.  Free flow of clear CSF was obtained prior to injecting  local anesthetic into the CSF.  The spinal needle aspirated freely following injection.  The needle was carefully withdrawn.  The patient tolerated the procedure well.

## 2015-08-19 NOTE — Progress Notes (Signed)
Spoke with Anthony/ortho tech, will apply cpm on arrival floor

## 2015-08-20 LAB — GLUCOSE, CAPILLARY
Glucose-Capillary: 186 mg/dL — ABNORMAL HIGH (ref 65–99)
Glucose-Capillary: 138 mg/dL — ABNORMAL HIGH (ref 65–99)
Glucose-Capillary: 178 mg/dL — ABNORMAL HIGH (ref 65–99)
Glucose-Capillary: 110 mg/dL — ABNORMAL HIGH (ref 65–99)

## 2015-08-20 LAB — CBC
HCT: 26 % — ABNORMAL LOW (ref 36.0–46.0)
Hemoglobin: 7.9 g/dL — ABNORMAL LOW (ref 12.0–15.0)
MCH: 18.5 pg — ABNORMAL LOW (ref 26.0–34.0)
MCHC: 30.4 g/dL (ref 30.0–36.0)
MCV: 61 fL — ABNORMAL LOW (ref 78.0–100.0)
Platelets: 245 10*3/uL (ref 150–400)
RBC: 4.26 MIL/uL (ref 3.87–5.11)
RDW: 15.1 % (ref 11.5–15.5)
WBC: 9.9 10*3/uL (ref 4.0–10.5)

## 2015-08-20 LAB — BASIC METABOLIC PANEL
Anion gap: 8 (ref 5–15)
BUN: 16 mg/dL (ref 6–20)
CO2: 27 mmol/L (ref 22–32)
Calcium: 8.8 mg/dL — ABNORMAL LOW (ref 8.9–10.3)
Chloride: 97 mmol/L — ABNORMAL LOW (ref 101–111)
Creatinine, Ser: 1.15 mg/dL — ABNORMAL HIGH (ref 0.44–1.00)
GFR calc Af Amer: 58 mL/min — ABNORMAL LOW (ref >60)
GFR calc non Af Amer: 50 mL/min — ABNORMAL LOW (ref >60)
Glucose, Bld: 187 mg/dL — ABNORMAL HIGH (ref 65–99)
Potassium: 4.3 mmol/L (ref 3.5–5.1)
Sodium: 132 mmol/L — ABNORMAL LOW (ref 135–145)

## 2015-08-20 LAB — PROTIME-INR
INR: 1.09 (ref 0.00–1.49)
Prothrombin Time: 14.3 seconds (ref 11.6–15.2)

## 2015-08-20 MED ORDER — WARFARIN SODIUM 7.5 MG PO TABS
7.5000 mg | ORAL_TABLET | Freq: Once | ORAL | Status: AC
  Administered 2015-08-20: 7.5 mg via ORAL
  Filled 2015-08-20: qty 1

## 2015-08-20 NOTE — Progress Notes (Signed)
ANTICOAGULATION CONSULT NOTE - Initial Consult  Pharmacy Consult for Coumadin Indication: VTE prophylaxis  Allergies  Allergen Reactions  . Penicillins Hives    Has patient had a PCN reaction causing immediate rash, facial/tongue/throat swelling, SOB or lightheadedness with hypotension: Yes Has patient had a PCN reaction causing severe rash involving mucus membranes or skin necrosis: No Has patient had a PCN reaction that required hospitalization No Has patient had a PCN reaction occurring within the last 10 years: No If all of the above answers are "NO", then may proceed with Cephalosporin use.    Patient Measurements: Weight = 102.7kg  Vital Signs: Temp: 98.4 F (36.9 C) (11/05 0515) Temp Source: Oral (11/05 0515) BP: 110/56 mmHg (11/05 0515) Pulse Rate: 93 (11/05 0515)  Labs:  Recent Labs  08/19/15 1906 08/20/15 0444  HGB 8.8* 7.9*  HCT 28.7* 26.0*  PLT 277 245  LABPROT  --  14.3  INR  --  1.09  CREATININE 1.20* 1.15*    Estimated Creatinine Clearance: 59.9 mL/min (by C-G formula based on Cr of 1.15).   Medical History: Past Medical History  Diagnosis Date  . Arthritis   . Diabetes mellitus without complication ([place])   . Ulcer   . Hypertension   . Sleep apnea     use CPAP  . Anemia     thalasemia  . Eye hemorrhage 4/16    resolved rt  . Shortness of breath dyspnea     exertion- none since stopped smoking  . Blood dyscrasia     thalasemia   Assessment: 59yof s/p L TKA on 11/4, on coumadin and lovenox 30mg  sq q 12 hrs for VTE prophylaxis. INR 1.09 after 7.5mg  x 1 last night. Hgb down to 7.9, Pltc wnl. No acute bleeding noted.   Goal of Therapy:  INR 2-3 Monitor platelets by anticoagulation protocol: Yes   Plan:  1) Coumadin 7.5mg  x 1 2) Daily INR 3) Continue lovenox 30mg  sq q12 until INR therapeutic  Maryanna Shape, PharmD, BCPS  Clinical Pharmacist  Pager: [phone]   08/20/2015,8:23 AM

## 2015-08-20 NOTE — Op Note (Signed)
NAMETANDA, MORRISSEY NO.:  [id number]  MEDICAL RECORD NO.:  [id number]  LOCATION:  5N10C                        FACILITY:  [facility]  PHYSICIAN:  Doran Heater. Veverly Fells, M.D. DATE OF BIRTH:  [birth year]  DATE OF PROCEDURE:  08/19/2015 DATE OF DISCHARGE:                              OPERATIVE REPORT   PREOPERATIVE DIAGNOSIS:  Left knee end-stage osteoarthritis.  POSTOPERATIVE DIAGNOSIS:  Left knee end-stage osteoarthritis.  PROCEDURE PERFORMED:  Left total knee replacement using DePuy Sigma rotating platform prosthesis.  ATTENDING SURGEON:  Doran Heater. Veverly Fells, M.D.  ASSISTANT:  Abbott Pao. Dixon, PA-C, who has scrubbed the entire procedure and necessary for satisfactory completion of surgery.  ANESTHESIA:  Spinal anesthesia was used plus femoral block.  ESTIMATED BLOOD LOSS:  Minimal.  FLUID REPLACEMENT:  1200 mL of crystalloid.  INSTRUMENT COUNTS:  Correct.  COMPLICATIONS:  There were no complications.  ANTIBIOTICS:  Perioperative antibiotics were given.  INDICATIONS:  The patient is a 59 year old female, with worsening left knee pain, secondary to end-stage arthritis.  She is status post total knee on the right.  Has done well following that surgery.  She has had progressive knee pain, despite conservative management.  Desires left total knee arthroplasty to restore function and eliminate pain. Informed consent obtained.  DESCRIPTION OF PROCEDURE:  After an adequate level of anesthesia was achieved, the patient was positioned supine on the operating room table. Left leg correctly identified.  A nonsterile tourniquet was placed in the proximal thigh.  Left leg sterilely prepped and draped in usual manner.  Time-out was called.  We elevated the leg and exsanguinated using the Esmarch bandage, elevated the tourniquet to 350 mmHg. Longitudinal midline incision was created with the knee in flexion. Dissection down through subcutaneous tissues.  Median  parapatellar arthrotomy was created using a fresh 10 blade scalpel.  The lateral patellofemoral ligaments were divided, the distal femur was entered using a step-cut drill.  An intramedullary resection guide was placed in the femur and 10 mm of bone was resected 5 degrees left knee.  Once we had a 10 mm resection off the distal femur, we then incised the femur, size 2.5 anterior down.  We then placed our 4 in 1 block and performed our anterior, posterior, and chamfer cuts.  Next, we resected ACL, PCL, and remaining meniscal tissues, subluxed the tibia anteriorly, placed our external alignment jig and cut the tibia 90 degrees perpendicular to the long axis of the tibia with minimal posterior slope for this posterior cruciate substituting prosthesis.  We sized the tibia to size 2.5.  We then completed our tibial preparation with the modular drill and keel punch proximally and placing our tibial component.  We then completed our posterior femoral condyle resection, the laminar spreader and the osteotome.  We made sure of our gaps, which were symmetric at 10 mm.  We then went ahead and made a box cut on the femur with the box cut guide for the 2.5 left femur.  Once we completed that, we impacted the 2.5 left femoral component, placed a 10 mm poly trial and then reduced the knee.  We felt like we could definitely get 12.5 and with a real. We then  resurfaced the patella going from 22 mm thickness down to about 16 and drilled for the 32 mm button.  We placed the patellar button in place.  We ranged the knee, had nice stability with no attached technique.  The IT band was little bit tight.  I did go ahead and do a slight partial release of that IT band using the Bovie.  Careful to maintain the integrity of the lateral collateral ligament.  Once we had removed our trial components, pulse irrigated the knee and dried the knee thoroughly we cemented the components into place.  A DePuy high viscosity  cement.  The tibia then femur then patella with a patellar clamp.  Once the cement was allowed to harden.  We removed excess cement with quarter-inch curved osteotome.  We then re-trialed with a 12.5 and felt very good about that stability, both in flexion and extension.  We then removed the trial 12.5, and then placed a real 12.5 poly, reduced the knee with a nice pop and felt like we had excellent stability in both flexion extension, with a patellar tracking normal.  We irrigated thoroughly and closed the parapatellar arthrotomy with interrupted #1 Vicryl suture followed by 0 Vicryl, 2-0 Vicryl layered subcu closure and 4-0 Monocryl for skin.  Steri-Strips applied followed by sterile dressing.  The patient tolerated the surgery well.     Doran Heater. Veverly Fells, M.D.     SRN/MEDQ  D:  08/19/2015  T:  08/20/2015  Job:  [id number]

## 2015-08-20 NOTE — Progress Notes (Signed)
Physical Therapy Treatment Patient Details Name: Lynn [name] MRN: [id number] DOB: [birth year] Today's Date: 08/20/2015    History of Present Illness Pt is married 59 yo female who recently had Rt TKR.  She now presents wtih Lt TKR.    PT Comments    Pt's mobility this afternoon significantly impacted by pain intensity (10/10 upon entering room).  Patient giving full effort during session today.  Applied CPM 0-90 degrees in bed, applied ice for pain management techniques.  Will continue to follow patient while on this venue of care to progress mobility.   Follow Up Recommendations  Home health PT;Supervision for mobility/OOB     Equipment Recommendations  None recommended by PT    Recommendations for Other Services       Precautions / Restrictions Precautions Precautions: Knee Precaution Booklet Issued: No Precaution Comments: verbal review of CPM wear schedule, bed positioning with return demo understanding Required Braces or Orthoses: Knee Immobilizer - Left Knee Immobilizer - Left: On except when in CPM Restrictions Weight Bearing Restrictions: Yes LLE Weight Bearing: Weight bearing as tolerated    Mobility  Bed Mobility Overal bed mobility: Needs Assistance Bed Mobility: Sit to Supine       Sit to supine: Min assist   General bed mobility comments: assist for Lt leg management, used trapeze  Transfers Overall transfer level: Needs assistance Equipment used: Rolling walker (2 wheeled) Transfers: Sit to/from Stand Sit to Stand: Mod assist         General transfer comment: min cues for hand placement  Ambulation/Gait Ambulation/Gait assistance: Min assist Ambulation Distance (Feet): 5 Feet Assistive device: Rolling walker (2 wheeled) Gait Pattern/deviations: Step-to pattern;Decreased step length - left;Decreased stance time - left;Antalgic     General Gait Details: incr difficulty with WB this afternoon secondary to pain   Stairs             Wheelchair Mobility    Modified Rankin (Stroke Patients Only)       Balance Overall balance assessment: Needs assistance Sitting-balance support: No upper extremity supported;Feet supported Sitting balance-Leahy Scale: Good     Standing balance support: Bilateral upper extremity supported Standing balance-Leahy Scale: Poor Standing balance comment: limited by pain                    Cognition Arousal/Alertness: Awake/alert Behavior During Therapy: WFL for tasks assessed/performed Overall Cognitive Status: Within Functional Limits for tasks assessed                      Exercises Total Joint Exercises Ankle Circles/Pumps: AROM;Left;10 reps;Supine Quad Sets: AROM;Left;10 reps;Supine Short Arc Quad: AAROM;Left;10 reps;Supine Heel Slides: AAROM;Left;10 reps;Supine Hip ABduction/ADduction: AROM;Left;10 reps;Supine Straight Leg Raises: AAROM;Left;10 reps;Supine Goniometric ROM: 70    General Comments        Pertinent Vitals/Pain Pain Assessment: 0-10 Pain Score: 10-Worst pain ever Pain Location: left knee Pain Descriptors / Indicators: Aching;Headache;Throbbing Pain Intervention(s): Limited activity within patient's tolerance;Monitored during session;Patient requesting pain meds-RN notified;Ice applied;Repositioned    Home Living Family/patient expects to be discharged to:: Private residence Living Arrangements: Spouse/significant other Available Help at Discharge: Family;Available 24 hours/day Type of Home: House Home Access: Stairs to enter Entrance Stairs-Rails: Can reach both Home Layout: Two level;Bed/bath upstairs Home Equipment: Walker - 2 wheels;Bedside commode;Cane - single point;Shower seat;Grab bars - tub/shower      Prior Function Level of Independence: Independent with assistive device(s)          PT Goals (current goals can  now be found in the care plan section) Acute Rehab PT Goals Patient Stated Goal: get pain under  control PT Goal Formulation: With patient Time For Goal Achievement: 08/27/15 Potential to Achieve Goals: Good Progress towards PT goals: Not progressing toward goals - comment (limited by pain this pm)    Frequency  7X/week    PT Plan Current plan remains appropriate    Co-evaluation             End of Session Equipment Utilized During Treatment: Gait belt;Left knee immobilizer Activity Tolerance: Patient limited by pain Patient left: in bed;in CPM;with call bell/phone within reach;with SCD's reapplied     Time: 1415-1454 PT Time Calculation (min) (ACUTE ONLY): 39 min  Charges:  $Therapeutic Exercise: 8-22 mins $Therapeutic Activity: 8-22 mins $Self Care/Home Management: 07-06-23                    G CodesMalka So, PT [phone]  Jerl Munyan 08/20/2015, 3:15 PM

## 2015-08-20 NOTE — Progress Notes (Signed)
OT Cancellation Note  Patient Details Name: Lynn [name] MRN: [id number] DOB: 11-25-1955   Cancelled Treatment:    Reason Eval/Treat Not Completed: OT screened, no needs identified, will sign off. Pt had recent knee surgery this year and has no OT concerns.  Benito Mccreedy  OTR/L [phone]  08/20/2015, 10:26 AM

## 2015-08-20 NOTE — Evaluation (Signed)
Physical Therapy Evaluation Patient Details Name: Lynn [name] MRN: [id number] DOB: [birth year] Today's Date: 08/20/2015   History of Present Illness  Pt is married 59 yo female who recently had Rt TKR.  She now presents wtih Lt TKR.  Clinical Impression  Patient with good mobility this am and she is well aware of her anticipated progression from recent Rt TKR.  Will continue to follow patient while on this venue of care to progress mobility. Pt appears to have good family support and accessible home upon discharge.She is also highly motivated.    Follow Up Recommendations Home health PT;Supervision for mobility/OOB    Equipment Recommendations  None recommended by PT    Recommendations for Other Services       Precautions / Restrictions Precautions Precautions: Knee Precaution Booklet Issued: Yes (comment) Precaution Comments: verbal understanding of WB status, and positioning  Required Braces or Orthoses: Knee Immobilizer - Left Knee Immobilizer - Left: On except when in CPM Restrictions Weight Bearing Restrictions: Yes LLE Weight Bearing: Weight bearing as tolerated      Mobility  Bed Mobility Overal bed mobility: Modified Independent             General bed mobility comments: supine to sit with raised HOB using rail  Transfers Overall transfer level: Needs assistance Equipment used: Rolling walker (2 wheeled) Transfers: Sit to/from Stand Sit to Stand: Mod assist         General transfer comment: min cues for hand placement  Ambulation/Gait Ambulation/Gait assistance: Min assist Ambulation Distance (Feet): 90 Feet Assistive device: Rolling walker (2 wheeled) Gait Pattern/deviations: Step-through pattern;Decreased stance time - left;Antalgic     General Gait Details: able to progress to placing more weight through leg as gait distances progressed  Stairs            Wheelchair Mobility    Modified Rankin (Stroke Patients Only)        Balance Overall balance assessment: Needs assistance Sitting-balance support: No upper extremity supported;Feet supported Sitting balance-Leahy Scale: Good     Standing balance support: Bilateral upper extremity supported Standing balance-Leahy Scale: Fair                               Pertinent Vitals/Pain Pain Assessment: 0-10 Pain Score: 7  Pain Location: left knee Pain Descriptors / Indicators: Sore;Aching;Discomfort Pain Intervention(s): Limited activity within patient's tolerance;Monitored during session    [place] expects to be discharged to:: Private residence Living Arrangements: Spouse/significant other Available Help at Discharge: Family;Available 24 hours/day Type of Home: House Home Access: Stairs to enter Entrance Stairs-Rails: Can reach both Entrance Stairs-Number of Steps: 4 Home Layout: Two level;Bed/bath upstairs Home Equipment: Walker - 2 wheels;Bedside commode;Cane - single point;Shower seat;Grab bars - tub/shower      Prior Function Level of Independence: Independent with assistive device(s)               Hand Dominance   Dominant Hand: Right    Extremity/Trunk Assessment   Upper Extremity Assessment: Overall WFL for tasks assessed           Lower Extremity Assessment: Generalized weakness      Cervical / Trunk Assessment: Normal  Communication   Communication: No difficulties  Cognition Arousal/Alertness: Awake/alert Behavior During Therapy: WFL for tasks assessed/performed Overall Cognitive Status: Within Functional Limits for tasks assessed  General Comments      Exercises Total Joint Exercises Ankle Circles/Pumps: AROM;Left;10 reps;Supine Quad Sets: AROM;Left;10 reps;Supine Heel Slides: AAROM;Left;10 reps;Supine Hip ABduction/ADduction: AROM;Left;10 reps;Supine Straight Leg Raises: AAROM;Left;10 reps;Supine      Assessment/Plan    PT Assessment Patient  needs continued PT services  PT Diagnosis Difficulty walking;Generalized weakness;Acute pain   PT Problem List Decreased strength;Decreased range of motion;Decreased activity tolerance;Decreased balance;Decreased mobility;Pain  PT Treatment Interventions DME instruction;Gait training;Stair training;Functional mobility training;Therapeutic activities;Therapeutic exercise;Balance training;Neuromuscular re-education;Patient/family education   PT Goals (Current goals can be found in the Care Plan section) Acute Rehab PT Goals Patient Stated Goal: get stronger PT Goal Formulation: With patient Time For Goal Achievement: 08/27/15 Potential to Achieve Goals: Good    Frequency 7X/week   Barriers to discharge        Co-evaluation               End of Session Equipment Utilized During Treatment: Gait belt;Left knee immobilizer Activity Tolerance: Patient tolerated treatment well;No increased pain Patient left: in chair;with call bell/phone within reach           Time: 1123-1158 PT Time Calculation (min) (ACUTE ONLY): 35 min   Charges:   PT Evaluation $Initial PT Evaluation Tier I: 1 Procedure PT Treatments $Therapeutic Exercise: 8-22 mins   PT G CodesMalka So, [place] [phone]  Ioana Louks 08/20/2015, 12:10 PM

## 2015-08-20 NOTE — Progress Notes (Signed)
Lynn [name]  MRN: [id number] DOB/Age: 1956-09-16 59 y.o. Physician: Ander Slade, M.D. 1 Day Post-Op Procedure(s) (LRB): LEFT TOTAL KNEE ARTHROPLASTY (Left)  Subjective: No c/o. Minimal pain Vital Signs Temp:  [97.5 F (36.4 C)-98.4 F (36.9 C)] 98.4 F (36.9 C) (11/05 0515) Pulse Rate:  [73-93] 93 (11/05 0515) Resp:  [15-19] 16 (11/05 0515) BP: (97-119)/(56-72) 110/56 mmHg (11/05 0515) SpO2:  [97 %-100 %] 100 % (11/05 0515)  Lab Results  Recent Labs  08/19/15 1906 08/20/15 0444  WBC 11.2* 9.9  HGB 8.8* 7.9*  HCT 28.7* 26.0*  PLT 277 245   BMET  Recent Labs  08/19/15 1906 08/20/15 0444  NA  --  132*  K  --  4.3  CL  --  97*  CO2  --  27  GLUCOSE  --  187*  BUN  --  16  CREATININE 1.20* 1.15*  CALCIUM  --  8.8*   INR  Date Value Ref Range Status  08/20/2015 1.09 0.00 - 1.49 Final     Exam  Sitting comfortably in chair at bedside, dressings dry, n/v intact LLE  Plan mobilize per TKA protocol.  Tristine Langi M Elza Varricchio 08/20/2015, 1:15 PM    Contact # [phone]

## 2015-08-21 LAB — GLUCOSE, CAPILLARY
Glucose-Capillary: 139 mg/dL — ABNORMAL HIGH (ref 65–99)
Glucose-Capillary: 86 mg/dL (ref 65–99)
Glucose-Capillary: 158 mg/dL — ABNORMAL HIGH (ref 65–99)

## 2015-08-21 LAB — CBC
HCT: 23.7 % — ABNORMAL LOW (ref 36.0–46.0)
Hemoglobin: 7.3 g/dL — ABNORMAL LOW (ref 12.0–15.0)
MCH: 18.6 pg — ABNORMAL LOW (ref 26.0–34.0)
MCHC: 30.8 g/dL (ref 30.0–36.0)
MCV: 60.5 fL — ABNORMAL LOW (ref 78.0–100.0)
Platelets: 237 10*3/uL (ref 150–400)
RBC: 3.92 MIL/uL (ref 3.87–5.11)
RDW: 15.1 % (ref 11.5–15.5)
WBC: 11.2 10*3/uL — ABNORMAL HIGH (ref 4.0–10.5)

## 2015-08-21 LAB — PROTIME-INR
INR: 1.18 (ref 0.00–1.49)
Prothrombin Time: 15.2 seconds (ref 11.6–15.2)

## 2015-08-21 MED ORDER — WARFARIN SODIUM 5 MG PO TABS
10.0000 mg | ORAL_TABLET | Freq: Once | ORAL | Status: AC
  Administered 2015-08-21: 10 mg via ORAL
  Filled 2015-08-21: qty 2

## 2015-08-21 NOTE — Progress Notes (Signed)
Subjective: 2 Days Post-Op Procedure(s) (LRB): LEFT TOTAL KNEE ARTHROPLASTY (Left) Patient reports pain as mild to left knee .  Progressing with PT. Tolerating PO's. Denies SOB, CP, or calf pain. No F/C.  Objective: Vital signs in last 24 hours: Temp:  [98.6 F (37 C)-100.3 F (37.9 C)] 98.9 F (37.2 C) (11/06 0652) Pulse Rate:  [94-105] 101 (11/06 0652) Resp:  [16-18] 16 (11/06 0652) BP: (105-131)/(47-89) 131/89 mmHg (11/06 0652) SpO2:  [95 %-98 %] 95 % (11/06 0652)  Intake/Output from previous day: 11/05 0701 - 11/06 0700 In: 360 [P.O.:360] Out: 550 [Urine:550] Intake/Output this shift: Total I/O In: 240 [P.O.:240] Out: -    Recent Labs  08/19/15 1906 08/20/15 0444 08/21/15 0302  HGB 8.8* 7.9* 7.3*    Recent Labs  08/20/15 0444 08/21/15 0302  WBC 9.9 11.2*  RBC 4.26 3.92  HCT 26.0* 23.7*  PLT 245 237    Recent Labs  08/19/15 1906 08/20/15 0444  NA  --  132*  K  --  4.3  CL  --  97*  CO2  --  27  BUN  --  16  CREATININE 1.20* 1.15*  GLUCOSE  --  187*  CALCIUM  --  8.8*    Recent Labs  08/20/15 0444 08/21/15 0730  INR 1.09 1.18    Alert and oriented x3. RRR, Lungs clear, BS x4. Left Calf soft and non tender. L knee dressing changed. No DVT signs. No signs of infection or compartment syndrome. LLE grossly neurovascularly intact.   Assessment/Plan: 2 Days Post-Op Procedure(s) (LRB): LEFT TOTAL KNEE ARTHROPLASTY (Left) Dressing changed Up with PT Plan D/c tomorrow Continue current care On coumadin  Trung Wenzl L 08/21/2015, 10:26 AM

## 2015-08-21 NOTE — Progress Notes (Signed)
Lab called patient has INR of 10 lab will re-draw to reassure INR.

## 2015-08-21 NOTE — Progress Notes (Signed)
Physical Therapy Treatment Patient Details Name: Lynn [name] MRN: [id number] DOB: 20-Feb-1956 Today's Date: 08/21/2015    History of Present Illness Pt is married 59 yo female who recently had Rt TKR.  She now presents wtih Lt TKR.    PT Comments    Lynn [name] is making progress w/ therapy but requires min assist for stability during sit<>stand.  Pt will benefit from continued skilled PT services to increase functional independence and safety.   Follow Up Recommendations  Home health PT;Supervision for mobility/OOB     Equipment Recommendations  None recommended by PT    Recommendations for Other Services       Precautions / Restrictions Precautions Precautions: Knee Required Braces or Orthoses: Knee Immobilizer - Left Knee Immobilizer - Left: On except when in CPM Restrictions Weight Bearing Restrictions: Yes LLE Weight Bearing: Weight bearing as tolerated    Mobility  Bed Mobility Overal bed mobility: Modified Independent Bed Mobility: Supine to Sit     Supine to sit: Modified independent (Device/Increase time)     General bed mobility comments: HOB flat and no use of bed rails.  Slightly increased time.  Transfers Overall transfer level: Needs assistance Equipment used: Rolling walker (2 wheeled) Transfers: Sit to/from Stand Sit to Stand: Min assist         General transfer comment: Assist to steady RW as pt pulls even though one hand on bed.  Cues for proper technique.  Ambulation/Gait Ambulation/Gait assistance: Supervision Ambulation Distance (Feet): 95 Feet Assistive device: Rolling walker (2 wheeled) Gait Pattern/deviations: Step-through pattern;Step-to pattern;Decreased stride length;Antalgic;Decreased weight shift to left;Decreased stance time - left   Gait velocity interpretation: Below normal speed for age/gender General Gait Details: incr difficulty with WB this afternoon secondary to tightness in calf.  Educated pt that placing foot flat  and allowing WB through Lt LE will gradually dec tightness in calf.  Cues to stand upright   Stairs            Wheelchair Mobility    Modified Rankin (Stroke Patients Only)       Balance Overall balance assessment: Needs assistance Sitting-balance support: No upper extremity supported;Feet supported Sitting balance-Leahy Scale: Good     Standing balance support: Bilateral upper extremity supported;During functional activity Standing balance-Leahy Scale: Poor Standing balance comment: Relies on RW for support                    Cognition Arousal/Alertness: Awake/alert Behavior During Therapy: WFL for tasks assessed/performed Overall Cognitive Status: Within Functional Limits for tasks assessed                      Exercises Total Joint Exercises Ankle Circles/Pumps: AROM;Left;10 reps;Seated Long Arc Quad: AROM;Left;10 reps;Seated Knee Flexion: AROM;Left;5 reps;Seated Goniometric ROM: ~80 deg Lt knee flexion    General Comments        Pertinent Vitals/Pain Pain Assessment: 0-10 Pain Score: 3  Pain Location: Lt knee Pain Descriptors / Indicators: Sore;Discomfort Pain Intervention(s): Limited activity within patient's tolerance;Monitored during session;Repositioned    Home Living                      Prior Function            PT Goals (current goals can now be found in the care plan section) Acute Rehab PT Goals Patient Stated Goal: to go home once ready PT Goal Formulation: With patient Time For Goal Achievement: 08/27/15 Potential to Achieve Goals: Good  Progress towards PT goals: Progressing toward goals    Frequency  7X/week    PT Plan Current plan remains appropriate    Co-evaluation             End of Session Equipment Utilized During Treatment: Gait belt Activity Tolerance: Patient limited by fatigue;Patient tolerated treatment well Patient left: in chair;with call bell/phone within reach     Time:  1336-1358 PT Time Calculation (min) (ACUTE ONLY): 22 min  Charges:  $Gait Training: 8-22 mins                    G Codes:      Lynn [name] PT, [place] [phone] Pager: [phone] 08/21/2015, 2:20 PM

## 2015-08-21 NOTE — Progress Notes (Signed)
ANTICOAGULATION CONSULT NOTE - Follow Up Consult  Pharmacy Consult for Warfarin Indication: VTE prophylaxis  Allergies  Allergen Reactions  . Penicillins Hives    Has patient had a PCN reaction causing immediate rash, facial/tongue/throat swelling, SOB or lightheadedness with hypotension: Yes Has patient had a PCN reaction causing severe rash involving mucus membranes or skin necrosis: No Has patient had a PCN reaction that required hospitalization No Has patient had a PCN reaction occurring within the last 10 years: No If all of the above answers are "NO", then may proceed with Cephalosporin use.    Patient Measurements: Weight: 103 kg  Vital Signs: Temp: 98.9 F (37.2 C) (11/06 0652) Temp Source: Oral (11/06 0652) BP: 131/89 mmHg (11/06 0652) Pulse Rate: 101 (11/06 0652)  Labs:  Recent Labs  08/19/15 1906 08/20/15 0444 08/21/15 0302 08/21/15 0730  HGB 8.8* 7.9* 7.3*  --   HCT 28.7* 26.0* 23.7*  --   PLT 277 245 237  --   LABPROT  --  14.3  --  15.2  INR  --  1.09  --  1.18  CREATININE 1.20* 1.15*  --   --     Estimated Creatinine Clearance: 59.9 mL/min (by C-G formula based on Cr of 1.15).   Medications:  Prescriptions prior to admission  Medication Sig Dispense Refill Last Dose  . Dulaglutide (TRULICITY) 5.88 FO/2.7XA SOPN Inject 0.79m into the skin weekly (Patient taking differently: Inject 0.75 mg into the skin once a week. On Mondays) 4 pen 11 Past Week at Unknown time  . glipiZIDE (GLUCOTROL) 5 MG tablet Take 1 tablet (5 mg total) by mouth 2 (two) times daily before a meal. 90 tablet 3 08/18/2015 at Unknown time  . losartan-hydrochlorothiazide (HYZAAR) 100-12.5 MG tablet TAKE 1 TABLET BY MOUTH DAILY  "OFFICE VISIT NEEDED FOR REFILLS" 90 tablet 0 08/18/2015 at Unknown time  . metFORMIN (GLUCOPHAGE) 1000 MG tablet TAKE 1 TABLET BY MOUTH TWICE A DAY WITH A MEAL 180 tablet 0 08/18/2015 at Unknown time  . Multiple Vitamin (MULTIVITAMIN WITH MINERALS) TABS tablet  Take 1 tablet by mouth daily.   Past Month at Unknown time  . naproxen sodium (ALEVE) 220 MG tablet Take 440 mg by mouth 2 (two) times daily as needed (pain).   Past Month at Unknown time  . ACCU-CHEK AVIVA PLUS test strip    Taking  . ACCU-CHEK SOFTCLIX LANCETS lancets      . blood glucose meter kit and supplies KIT Dispense based on patient and insurance preference. Use up to four times daily as directed. (FOR ICD-9 250.00, 250.01). 1 each 0 Taking  . Blood Glucose Monitoring Suppl (ACCU-CHEK AVIVA PLUS) W/DEVICE KIT    Taking  . methocarbamol (ROBAXIN) 500 MG tablet Take 1 tablet (500 mg total) by mouth 3 (three) times daily as needed. (Patient not taking: Reported on 08/05/2015) 60 tablet 1 Not Taking at Unknown time  . pravastatin (PRAVACHOL) 20 MG tablet Take 1 tablet (20 mg total) by mouth daily. 90 tablet 3 More than a month at Unknown time   Scheduled:  . docusate sodium  100 mg Oral BID  . Dulaglutide  0.75 mg Subcutaneous Weekly  . enoxaparin (LOVENOX) injection  30 mg Subcutaneous Q12H  . ferrous sulfate  325 mg Oral TID PC  . glipiZIDE  5 mg Oral BID AC  . losartan  100 mg Oral Daily   And  . hydrochlorothiazide  12.5 mg Oral Daily  . insulin aspart  0-20 Units Subcutaneous TID  WC  . insulin aspart  0-5 Units Subcutaneous QHS  . insulin aspart  6 Units Subcutaneous TID WC  . metFORMIN  1,000 mg Oral BID WC  . multivitamin with minerals  1 tablet Oral Daily  . Warfarin - Pharmacist Dosing Inpatient   Does not apply q1800   Infusions:  . sodium chloride 75 mL/hr at 08/19/15 1745  . lactated ringers      Assessment: 59yof s/p L TKA on 11/4, on coumadin and lovenox 55m sq q 12 hrs for VTE prophylaxis.  INR 1.09>1.18 after 2 doses of Warfarin 7.5 mg.   No s/sx bleeding noted  Plts 237, Hgb 7.3  Goal of Therapy:  INR 2-3 Monitor platelets by anticoagulation protocol: Yes   Plan:  Warfarin 10 mg x 1 Continue lovenox 336msq q12 until INR therapeutic INR Daily   Monitor for s/sx of bleeding    KeBennye AlmPharmD Pharmacy Resident [phone] 08/21/2015,10:06 AM

## 2015-08-21 NOTE — Care Management Note (Signed)
Case Management Note  Patient Details  Name: Corissa Oguinn MRN: [id number] Date of Birth: 04/23/1956  Subjective/Objective:                  LEFT TOTAL KNEE ARTHROPLASTY (Left)  Action/Plan: CM spoke to patient at the bedside who is planning to be discharged tomorrow. Patient has the support of her husband at home and denies difficulty obtaining medications. Patient has DME at home: rolling walker, BSC, shower chair, and shower bars. Pt denies additional DME needs and said that CPM machine is being delivered to her home. Patient on [facility] list; CM offered patient choice and she said she still wants to go with [facility] as that is who she used before. Cm called Amy Brisson with [facility] to advise of [facility] PT need and referral accepted. CM will remains available for further discharge planning needs. Cm awaiting MD order to be entered.   Expected Discharge Date:  08/22/15               Expected Discharge Plan:  [place]  In-House Referral:     Discharge planning Services  CM Consult  Post Acute Care Choice:  Home Health Choice offered to:  Patient  DME Arranged:    DME Agency:     HH Arranged:  PT HH Agency:  [place]  Status of Service:  In process, will continue to follow.   Medicare Important Message Given:    Date Medicare IM Given:    Medicare IM give by:    Date Additional Medicare IM Given:    Additional Medicare Important Message give by:     If discussed at [place] of Stay Meetings, dates discussed:    Additional Comments:  Guido Sander, RN 08/21/2015, 11:18 AM

## 2015-08-22 ENCOUNTER — Encounter (HOSPITAL_COMMUNITY): Payer: Self-pay | Admitting: Orthopedic Surgery

## 2015-08-22 LAB — CBC
HCT: 22.7 % — ABNORMAL LOW (ref 36.0–46.0)
Hemoglobin: 7 g/dL — ABNORMAL LOW (ref 12.0–15.0)
MCH: 18.7 pg — ABNORMAL LOW (ref 26.0–34.0)
MCHC: 30.8 g/dL (ref 30.0–36.0)
MCV: 60.5 fL — ABNORMAL LOW (ref 78.0–100.0)
Platelets: 222 10*3/uL (ref 150–400)
RBC: 3.75 MIL/uL — ABNORMAL LOW (ref 3.87–5.11)
RDW: 15.3 % (ref 11.5–15.5)
WBC: 11 10*3/uL — ABNORMAL HIGH (ref 4.0–10.5)

## 2015-08-22 LAB — GLUCOSE, CAPILLARY
Glucose-Capillary: 94 mg/dL (ref 65–99)
Glucose-Capillary: 196 mg/dL — ABNORMAL HIGH (ref 65–99)
Glucose-Capillary: 161 mg/dL — ABNORMAL HIGH (ref 65–99)
Glucose-Capillary: 112 mg/dL — ABNORMAL HIGH (ref 65–99)

## 2015-08-22 LAB — PROTIME-INR
INR: 1.28 (ref 0.00–1.49)
Prothrombin Time: 16.1 seconds — ABNORMAL HIGH (ref 11.6–15.2)

## 2015-08-22 NOTE — Progress Notes (Signed)
   Subjective: 3 Days Post-Op Procedure(s) (LRB): LEFT TOTAL KNEE ARTHROPLASTY (Left)  Pt doing very well Feels better than the right one Ready for d/c home Patient reports pain as mild.  Objective:   VITALS:   Filed Vitals:   08/22/15 0642  BP: 111/53  Pulse: 87  Temp: 98.7 F (37.1 C)  Resp: 16    Well healing incision to left knee nv intact distally Not dizzy or light headed when up  [place]  08/20/15 0444 08/21/15 0302 08/22/15 0456  HGB 7.9* 7.3* 7.0*  HCT 26.0* 23.7* 22.7*  WBC 9.9 11.2* 11.0*  PLT 245 237 222     Recent Labs  08/19/15 1906 08/20/15 0444  NA  --  132*  K  --  4.3  BUN  --  16  CREATININE 1.20* 1.15*  GLUCOSE  --  187*     Assessment/Plan: 3 Days Post-Op Procedure(s) (LRB): LEFT TOTAL KNEE ARTHROPLASTY (Left) Acute blood loss anemia - currently asymptomatic and pt does not wish to have transfusion D/c home today F/ in 2 weeks    Merla Riches, MPAS, PA-C  08/22/2015, 9:51 AM

## 2015-08-22 NOTE — Discharge Summary (Signed)
Physician Discharge Summary   Patient ID: Lynn [name] MRN: [id number] DOB/AGE: [birth year] 61 y.o.  Admit date: 08/19/2015 Discharge date: 08/22/2015  Admission Diagnoses:  Active Problems:   H/O total knee replacement   Discharge Diagnoses:  Same Acute blood loss anemia - asymptomatic  Surgeries: Procedure(s): LEFT TOTAL KNEE ARTHROPLASTY on 08/19/2015   Consultants: PT/OT  Discharged Condition: Stable  Hospital Course: Lynn [name] is an 59 y.o. female who was admitted 08/19/2015 with a chief complaint of No chief complaint on file. , and found to have a diagnosis of <principal problem not specified>.  They were brought to the operating room on 08/19/2015 and underwent the above named procedures.    The patient had an uncomplicated hospital course and was stable for discharge.  Recent vital signs:  Filed Vitals:   08/22/15 0642  BP: 111/53  Pulse: 87  Temp: 98.7 F (37.1 C)  Resp: 16    Recent laboratory studies:  Results for orders placed or performed during the hospital encounter of 08/19/15  Glucose, capillary  Result Value Ref Range   Glucose-Capillary 153 (H) 65 - 99 mg/dL  Glucose, capillary  Result Value Ref Range   Glucose-Capillary 113 (H) 65 - 99 mg/dL  CBC  Result Value Ref Range   WBC 11.2 (H) 4.0 - 10.5 K/uL   RBC 4.68 3.87 - 5.11 MIL/uL   Hemoglobin 8.8 (L) 12.0 - 15.0 g/dL   HCT 28.7 (L) 36.0 - 46.0 %   MCV 61.3 (L) 78.0 - 100.0 fL   MCH 18.8 (L) 26.0 - 34.0 pg   MCHC 30.7 30.0 - 36.0 g/dL   RDW 15.3 11.5 - 15.5 %   Platelets 277 150 - 400 K/uL  Creatinine, serum  Result Value Ref Range   Creatinine, Ser 1.20 (H) 0.44 - 1.00 mg/dL   GFR calc non Af Amer 48 (L) >60 mL/min   GFR calc Af Amer 55 (L) >60 mL/min  Protime-INR  Result Value Ref Range   Prothrombin Time 14.3 11.6 - 15.2 seconds   INR 1.09 0.00 - 1.49  CBC  Result Value Ref Range   WBC 9.9 4.0 - 10.5 K/uL   RBC 4.26 3.87 - 5.11 MIL/uL   Hemoglobin 7.9 (L) 12.0 - 15.0  g/dL   HCT 26.0 (L) 36.0 - 46.0 %   MCV 61.0 (L) 78.0 - 100.0 fL   MCH 18.5 (L) 26.0 - 34.0 pg   MCHC 30.4 30.0 - 36.0 g/dL   RDW 15.1 11.5 - 15.5 %   Platelets 245 150 - 400 K/uL  Basic metabolic panel  Result Value Ref Range   Sodium 132 (L) 135 - 145 mmol/L   Potassium 4.3 3.5 - 5.1 mmol/L   Chloride 97 (L) 101 - 111 mmol/L   CO2 27 22 - 32 mmol/L   Glucose, Bld 187 (H) 65 - 99 mg/dL   BUN 16 6 - 20 mg/dL   Creatinine, Ser 1.15 (H) 0.44 - 1.00 mg/dL   Calcium 8.8 (L) 8.9 - 10.3 mg/dL   GFR calc non Af Amer 50 (L) >60 mL/min   GFR calc Af Amer 58 (L) >60 mL/min   Anion gap 8 5 - 15  Glucose, capillary  Result Value Ref Range   Glucose-Capillary 217 (H) 65 - 99 mg/dL  Glucose, capillary  Result Value Ref Range   Glucose-Capillary 186 (H) 65 - 99 mg/dL  Glucose, capillary  Result Value Ref Range   Glucose-Capillary 138 (H) 65 - 99 mg/dL  CBC  Result Value Ref Range   WBC 11.2 (H) 4.0 - 10.5 K/uL   RBC 3.92 3.87 - 5.11 MIL/uL   Hemoglobin 7.3 (L) 12.0 - 15.0 g/dL   HCT 23.7 (L) 36.0 - 46.0 %   MCV 60.5 (L) 78.0 - 100.0 fL   MCH 18.6 (L) 26.0 - 34.0 pg   MCHC 30.8 30.0 - 36.0 g/dL   RDW 15.1 11.5 - 15.5 %   Platelets 237 150 - 400 K/uL  Glucose, capillary  Result Value Ref Range   Glucose-Capillary 178 (H) 65 - 99 mg/dL  Glucose, capillary  Result Value Ref Range   Glucose-Capillary 110 (H) 65 - 99 mg/dL  Protime-INR  Result Value Ref Range   Prothrombin Time 15.2 11.6 - 15.2 seconds   INR 1.18 0.00 - 1.49  Glucose, capillary  Result Value Ref Range   Glucose-Capillary 139 (H) 65 - 99 mg/dL  Glucose, capillary  Result Value Ref Range   Glucose-Capillary 86 65 - 99 mg/dL  Protime-INR  Result Value Ref Range   Prothrombin Time 16.1 (H) 11.6 - 15.2 seconds   INR 1.28 0.00 - 1.49  CBC  Result Value Ref Range   WBC 11.0 (H) 4.0 - 10.5 K/uL   RBC 3.75 (L) 3.87 - 5.11 MIL/uL   Hemoglobin 7.0 (L) 12.0 - 15.0 g/dL   HCT 22.7 (L) 36.0 - 46.0 %   MCV 60.5 (L) 78.0  - 100.0 fL   MCH 18.7 (L) 26.0 - 34.0 pg   MCHC 30.8 30.0 - 36.0 g/dL   RDW 15.3 11.5 - 15.5 %   Platelets 222 150 - 400 K/uL  Glucose, capillary  Result Value Ref Range   Glucose-Capillary 158 (H) 65 - 99 mg/dL  Glucose, capillary  Result Value Ref Range   Glucose-Capillary 196 (H) 65 - 99 mg/dL  Glucose, capillary  Result Value Ref Range   Glucose-Capillary 161 (H) 65 - 99 mg/dL  Glucose, capillary  Result Value Ref Range   Glucose-Capillary 94 65 - 99 mg/dL    Discharge Medications:     Medication List    STOP taking these medications        ALEVE 220 MG tablet  Generic drug:  naproxen sodium      TAKE these medications        ACCU-CHEK AVIVA PLUS test strip  Generic drug:  glucose blood     ACCU-CHEK AVIVA PLUS W/DEVICE Kit     ACCU-CHEK SOFTCLIX LANCETS lancets     blood glucose meter kit and supplies Kit  Dispense based on patient and insurance preference. Use up to four times daily as directed. (FOR ICD-9 250.00, 250.01).     Dulaglutide 0.75 MG/0.5ML Sopn  Commonly known as:  TRULICITY  Inject 3.64WO into the skin weekly     glipiZIDE 5 MG tablet  Commonly known as:  GLUCOTROL  Take 1 tablet (5 mg total) by mouth 2 (two) times daily before a meal.     losartan-hydrochlorothiazide 100-12.5 MG tablet  Commonly known as:  HYZAAR  TAKE 1 TABLET BY MOUTH DAILY  "OFFICE VISIT NEEDED FOR REFILLS"     metFORMIN 1000 MG tablet  Commonly known as:  GLUCOPHAGE  TAKE 1 TABLET BY MOUTH TWICE A DAY WITH A MEAL     methocarbamol 500 MG tablet  Commonly known as:  ROBAXIN  Take 1 tablet (500 mg total) by mouth 3 (three) times daily as needed.     methocarbamol 500 MG tablet  Commonly known as:  ROBAXIN  Take 1 tablet (500 mg total) by mouth 3 (three) times daily as needed.     multivitamin with minerals Tabs tablet  Take 1 tablet by mouth daily.     oxyCODONE-acetaminophen 5-325 MG tablet  Commonly known as:  ROXICET  Take 1-2 tablets by mouth every 4  (four) hours as needed for severe pain.     pravastatin 20 MG tablet  Commonly known as:  PRAVACHOL  Take 1 tablet (20 mg total) by mouth daily.     warfarin 5 MG tablet  Commonly known as:  COUMADIN  Take 1 tablet (5 mg total) by mouth daily. Take as directed per the pharmacist for INR target from 2.5-3.0 for 30 days post op        Diagnostic Studies: No results found.  Disposition: 06-Home-Health Care Svc        Follow-up Information    Follow up with NORRIS,STEVEN R, MD. Call in 2 weeks.   Specialty:  Orthopedic Surgery   Why:  [phone]   Contact information:   [address] [place] [address] [phone]        Signed: Ventura Bruns 08/22/2015, 9:53 AM

## 2015-08-22 NOTE — Progress Notes (Signed)
Physical Therapy Treatment Patient Details Name: Lynn [name] MRN: [id number] DOB: 1955-11-24 Today's Date: 08-31-15    History of Present Illness Pt is married 59 yo female who recently had Rt TKR.  She now presents wtih Lt TKR.    PT Comments    Pt moving well and able to increase ambulation distance as well as perform stairs today. Pt educated for HEP, KI use and need to continue to strengthen left quad for increased stability with gait. Pt has been walking to the bathroom without KI but preferred its use for long hall ambulation. Will continue to follow. Safe for return home.   Follow Up Recommendations  Home health PT;Supervision for mobility/OOB     Equipment Recommendations       Recommendations for Other Services       Precautions / Restrictions Precautions Precautions: Knee Required Braces or Orthoses: Knee Immobilizer - Left Restrictions LLE Weight Bearing: Weight bearing as tolerated    Mobility  Bed Mobility Overal bed mobility: Modified Independent                Transfers Overall transfer level: Modified independent                  Ambulation/Gait Ambulation/Gait assistance: Supervision Ambulation Distance (Feet): 150 Feet Assistive device: Rolling walker (2 wheeled) Gait Pattern/deviations: Step-through pattern;Decreased stride length;Decreased stance time - left   Gait velocity interpretation: Below normal speed for age/gender General Gait Details: cues for posture. walked 150' and stairs, seated rest then ambulated additional 100'   Stairs Stairs: Yes Stairs assistance: Supervision Stair Management: Step to pattern;Forwards;Two rails Number of Stairs: 11 General stair comments: slow, cautious ascent and descent with cues x 1  Wheelchair Mobility    Modified Rankin (Stroke Patients Only)       Balance                                    Cognition Arousal/Alertness: Awake/alert Behavior During Therapy:  WFL for tasks assessed/performed Overall Cognitive Status: Within Functional Limits for tasks assessed                      Exercises Total Joint Exercises Quad Sets: AROM;Left;15 reps;Supine Heel Slides: AAROM;Left;Supine;15 reps Straight Leg Raises: AAROM;Left;10 reps;Supine Goniometric ROM: 4-75    General Comments        Pertinent Vitals/Pain Pain Assessment: No/denies pain    Home Living                      Prior Function            PT Goals (current goals can now be found in the care plan section) Progress towards PT goals: Progressing toward goals    Frequency       PT Plan Current plan remains appropriate    Co-evaluation             End of Session   Activity Tolerance: Patient tolerated treatment well Patient left: in chair;with call bell/phone within reach     Time: 0836-0905 PT Time Calculation (min) (ACUTE ONLY): 29 min  Charges:  $Gait Training: 8-22 mins $Therapeutic Exercise: 8-22 mins                    G Codes:      Melford Aase Aug 31, 2015, 10:11 AM Elwyn Reach, [place]

## 2015-11-02 ENCOUNTER — Other Ambulatory Visit: Payer: Self-pay | Admitting: Family Medicine

## 2015-11-04 ENCOUNTER — Encounter: Payer: Self-pay | Admitting: Family Medicine

## 2015-11-07 ENCOUNTER — Telehealth: Payer: Self-pay

## 2015-11-07 MED ORDER — METFORMIN HCL 1000 MG PO TABS: ORAL_TABLET | ORAL | Status: DC

## 2015-11-07 MED ORDER — LOSARTAN POTASSIUM-HCTZ 100-12.5 MG PO TABS: ORAL_TABLET | ORAL | Status: DC

## 2015-11-07 MED ORDER — BLOOD GLUCOSE MONITOR KIT: PACK | Status: DC

## 2015-11-07 NOTE — Telephone Encounter (Signed)
I faxed in her blood glucose meter kit by request from Dr. Lorelei Pont was sent to [facility] pharmacy

## 2015-11-07 NOTE — Addendum Note (Signed)
Addended by: Lamar Blinks C on: 11/07/2015 02:06 PM   Modules accepted: Orders

## 2015-11-09 ENCOUNTER — Encounter: Payer: Self-pay | Admitting: Family Medicine

## 2015-11-10 ENCOUNTER — Telehealth: Payer: Self-pay

## 2015-11-10 DIAGNOSIS — E669 Obesity, unspecified: Principal | ICD-10-CM

## 2015-11-10 DIAGNOSIS — E1169 Type 2 diabetes mellitus with other specified complication: Secondary | ICD-10-CM

## 2015-11-10 MED ORDER — GLUCOSE BLOOD VI STRP: ORAL_STRIP | Status: DC

## 2015-11-10 MED ORDER — DULAGLUTIDE 0.75 MG/0.5ML ~~LOC~~ SOAJ: SUBCUTANEOUS | Status: DC

## 2015-11-10 MED ORDER — GLIPIZIDE 5 MG PO TABS: 5.0000 mg | ORAL_TABLET | Freq: Two times a day (BID) | ORAL | Status: DC

## 2015-11-10 MED ORDER — BLOOD GLUCOSE MONITOR KIT: PACK | Status: DC

## 2015-11-10 NOTE — Telephone Encounter (Signed)
Pharm faxed notice that One touch ultra strips are not covered by ins w/out PA. Called [facility] [employer] and they advise that the [employer] Next strips/meter are going to be the best buy for pt, and if she called [phone], she may qualify for a free meter. If not, she should google the name of meter and may get coupon for meter.  Called pt who stated that the pharm got mixed up. She doesn't need these supplies right now, but does need her DM and HTN meds. Dr Lorelei Pont, sent in RFs of two meds for her for 6 mos on 1/23. Pt also needs trulicity and glipizide, and I will give her same # of RFs as Dr Lorelei Pont did on others.

## 2015-11-15 DIAGNOSIS — Z0279 Encounter for issue of other medical certificate: Secondary | ICD-10-CM

## 2015-12-02 DIAGNOSIS — Z0271 Encounter for disability determination: Secondary | ICD-10-CM

## 2016-01-03 ENCOUNTER — Ambulatory Visit (INDEPENDENT_AMBULATORY_CARE_PROVIDER_SITE_OTHER): Payer: BLUE CROSS/BLUE SHIELD | Admitting: Emergency Medicine

## 2016-01-03 VITALS — BP 136/82 | HR 57 | Temp 97.9°F | Resp 17 | Ht 63.0 in | Wt 233.0 lb

## 2016-01-03 DIAGNOSIS — E119 Type 2 diabetes mellitus without complications: Secondary | ICD-10-CM | POA: Diagnosis not present

## 2016-01-03 DIAGNOSIS — D563 Thalassemia minor: Secondary | ICD-10-CM | POA: Diagnosis not present

## 2016-01-03 DIAGNOSIS — I10 Essential (primary) hypertension: Secondary | ICD-10-CM | POA: Diagnosis not present

## 2016-01-03 DIAGNOSIS — E669 Obesity, unspecified: Secondary | ICD-10-CM

## 2016-01-03 DIAGNOSIS — E1169 Type 2 diabetes mellitus with other specified complication: Secondary | ICD-10-CM

## 2016-01-03 DIAGNOSIS — E7800 Pure hypercholesterolemia, unspecified: Secondary | ICD-10-CM

## 2016-01-03 LAB — LIPID PANEL
Cholesterol: 201 mg/dL — ABNORMAL HIGH (ref 125–200)
HDL: 50 mg/dL (ref >=46)
LDL Cholesterol: 123 mg/dL (ref <130)
Total CHOL/HDL Ratio: 4 Ratio (ref <=5.0)
Triglycerides: 139 mg/dL (ref <150)
VLDL: 28 mg/dL (ref <30)

## 2016-01-03 LAB — POCT CBC
Granulocyte percent: 69.3 %G (ref 37–80)
HCT, POC: 29.8 % — AB (ref 37.7–47.9)
Hemoglobin: 9.7 g/dL — AB (ref 12.2–16.2)
Lymph, poc: 2 (ref 0.6–3.4)
MCH, POC: 19 pg — AB (ref 27–31.2)
MCHC: 32.5 g/dL (ref 31.8–35.4)
MCV: 58.6 fL — AB (ref 80–97)
MID (cbc): 0.5 (ref 0–0.9)
MPV: 8.7 fL (ref 0–99.8)
POC Granulocyte: 5.7 (ref 2–6.9)
POC LYMPH PERCENT: 24.7 %L (ref 10–50)
POC MID %: 6 %M (ref 0–12)
Platelet Count, POC: 277 10*3/uL (ref 142–424)
RBC: 5.09 M/uL (ref 4.04–5.48)
RDW, POC: 15.8 %
WBC: 8.2 10*3/uL (ref 4.6–10.2)

## 2016-01-03 LAB — BASIC METABOLIC PANEL WITH GFR
BUN: 16 mg/dL (ref 7–25)
CO2: 24 mmol/L (ref 20–31)
Calcium: 9.3 mg/dL (ref 8.6–10.4)
Chloride: 99 mmol/L (ref 98–110)
Creat: 0.95 mg/dL (ref 0.50–0.99)
GFR, Est African American: 75 mL/min (ref >=60)
GFR, Est Non African American: 65 mL/min (ref >=60)
Glucose, Bld: 262 mg/dL — ABNORMAL HIGH (ref 65–99)
Potassium: 4.1 mmol/L (ref 3.5–5.3)
Sodium: 136 mmol/L (ref 135–146)

## 2016-01-03 LAB — GLUCOSE, POCT (MANUAL RESULT ENTRY): POC Glucose: 277 mg/dl — AB (ref 70–99)

## 2016-01-03 LAB — POCT GLYCOSYLATED HEMOGLOBIN (HGB A1C): Hemoglobin A1C: 7.6

## 2016-01-03 MED ORDER — METFORMIN HCL 1000 MG PO TABS: ORAL_TABLET | ORAL | Status: DC

## 2016-01-03 MED ORDER — DULAGLUTIDE 0.75 MG/0.5ML ~~LOC~~ SOAJ: SUBCUTANEOUS | Status: DC

## 2016-01-03 MED ORDER — LOSARTAN POTASSIUM-HCTZ 100-12.5 MG PO TABS: ORAL_TABLET | ORAL | Status: DC

## 2016-01-03 NOTE — Patient Instructions (Addendum)
     IF you received an x-ray today, you will receive an invoice from [facility]. Please contact [facility] at [phone] with questions or concerns regarding your invoice.   IF you received labwork today, you will receive an invoice from [facility]. Please contact [facility] at [phone] with questions or concerns regarding your invoice.   Our billing staff will not be able to assist you with questions regarding bills from these companies.  You will be contacted with the lab results as soon as they are available. The fastest way to get your results is to activate your My Chart account. Instructions are located on the last page of this paperwork. If you have not heard from us regarding the results in 2 weeks, please contact this office.         IF you received an x-ray today, you will receive an invoice from [facility]. Please contact [facility] at [phone] with questions or concerns regarding your invoice.   IF you received labwork today, you will receive an invoice from [facility]. Please contact [facility] at [phone] with questions or concerns regarding your invoice.   Our billing staff will not be able to assist you with questions regarding bills from these companies.  You will be contacted with the lab results as soon as they are available. The fastest way to get your results is to activate your My Chart account. Instructions are located on the last page of this paperwork. If you have not heard from us regarding the results in 2 weeks, please contact this office.     

## 2016-01-03 NOTE — Progress Notes (Signed)
Patient ID: Lynn [name], female   DOB: [birth year], 60 y.o.   MRN: [id number]    By signing my name below, I, Essence Howell, attest that this documentation has been prepared under the direction and in the presence of Darlyne Russian, MD Electronically Signed: Ladene Artist, ED Scribe 01/03/2016 at 10:54 AM.  Chief Complaint:  Chief Complaint  Patient presents with  . Medication Refill    metformin,   HPI: Lynn [name] is a 60 y.o. female who reports to [facility] today for a medication refill of Metformin. Pt states that she is doing well overall. She denies sores on her feet. She was prescribed Trulicity by Lamar Blinks, MD but stopped the medication due to side effects of diarrhea, indigestion and SOB. Pt states that symptoms resolved once she stopped the medication.   Bilateral Knee Arthoplasty  Pt reports pshx of right total knee arthoplasty on 02/18/15 and a left total knee arthoplasty in 08/19/15. She states that both knees have been great and she is able to use her exercise bike as well as the treadmill.   Past Medical History  Diagnosis Date  . Arthritis   . Diabetes mellitus without complication ([place])   . Ulcer   . Hypertension   . Sleep apnea     use CPAP  . Anemia     thalasemia  . Eye hemorrhage 4/16    resolved rt  . Shortness of breath dyspnea     exertion- none since stopped smoking  . Blood dyscrasia     thalasemia   Past Surgical History  Procedure Laterality Date  . Carpal tunnel release Right 11/01/2014  . Arthroscopic knee Left 2-16  . Abdominal hysterectomy    . Tonsillectomy    . Total knee arthroplasty Right 02/18/2015    Procedure: RIGHT TOTAL KNEE ARTHROPLASTY;  Surgeon: Netta Cedars, MD;  Location: [place];  Service: Orthopedics;  Laterality: Right;  . Joint replacement    . Total knee arthroplasty Left 08/19/2015    Procedure: LEFT TOTAL KNEE ARTHROPLASTY;  Surgeon: Netta Cedars, MD;  Location: [place];  Service: Orthopedics;  Laterality: Left;    Social History   Social History  . Marital Status: Married    Spouse Name: N/A  . Number of Children: N/A  . Years of Education: N/A   Social History Main Topics  . Smoking status: Former Smoker -- 0.25 packs/day for 44 years    Types: Cigarettes    Quit date: 02/17/2015  . Smokeless tobacco: Never Used  . Alcohol Use: 0.0 oz/week    0 Standard drinks or equivalent per week     Comment: socially  . Drug Use: No  . Sexual Activity: Not Asked   Other Topics Concern  . None   Social History Narrative   Family History  Problem Relation Age of Onset  . Cancer Mother   . Diabetes Mother   . Hypertension Mother   . Hyperlipidemia Mother   . Cancer Father   . Hyperlipidemia Father   . Mental illness Sister   . Diabetes Sister   . Diabetes Maternal Grandmother   . Diabetes Maternal Grandfather    Allergies  Allergen Reactions  . Penicillins Hives    Has patient had a PCN reaction causing immediate rash, facial/tongue/throat swelling, SOB or lightheadedness with hypotension: Yes Has patient had a PCN reaction causing severe rash involving mucus membranes or skin necrosis: No Has patient had a PCN reaction that required hospitalization No Has patient had a  PCN reaction occurring within the last 10 years: No If all of the above answers are "NO", then may proceed with Cephalosporin use.   Prior to Admission medications   Medication Sig Start Date End Date Taking? Authorizing Provider  blood glucose meter kit and supplies KIT Use up to four times daily as directed.Dx: E11.9 11/10/15  Yes Gay Filler Copland, MD  Blood Glucose Monitoring Suppl (ACCU-CHEK AVIVA PLUS) W/DEVICE KIT  01/19/15  Yes Historical Provider, MD  Dulaglutide (TRULICITY) 2.50 NL/9.7QB SOPN Inject 0.'75mg'$  into the skin weekly 11/10/15  Yes Gay Filler Copland, MD  glipiZIDE (GLUCOTROL) 5 MG tablet Take 1 tablet (5 mg total) by mouth 2 (two) times daily before a meal. 11/10/15  Yes Jessica C Copland, MD  glucose  blood test strip Use up to four times daily as directly. Dx E11.9 11/10/15  Yes Gay Filler Copland, MD  losartan-hydrochlorothiazide (HYZAAR) 100-12.5 MG tablet TAKE 1 TABLET BY MOUTH DAILY  "OFFICE VISIT NEEDED FOR REFILLS" 11/07/15  Yes Gay Filler Copland, MD  metFORMIN (GLUCOPHAGE) 1000 MG tablet TAKE 1 TABLET BY MOUTH TWICE A DAY WITH A MEAL 11/07/15  Yes Jessica C Copland, MD  pravastatin (PRAVACHOL) 20 MG tablet Take 1 tablet (20 mg total) by mouth daily. 08/08/15  Yes Gay Filler Copland, MD  Multiple Vitamin (MULTIVITAMIN WITH MINERALS) TABS tablet Take 1 tablet by mouth daily. Reported on 01/03/2016    Historical Provider, MD   ROS: The patient denies fevers, chills, night sweats, unintentional weight loss, chest pain, palpitations, wheezing, dyspnea on exertion, nausea, vomiting, abdominal pain, dysuria, hematuria, melena, numbness, weakness, or tingling  All other systems have been reviewed and were otherwise negative with the exception of those mentioned in the HPI and as above.    PHYSICAL EXAM: Filed Vitals:   01/03/16 1028  BP: 166/78  Pulse: 57  Temp: 97.9 F (36.6 C)  Resp: 17   Body mass index is 41.28 kg/(m^2).  General: Alert, no acute distress HEENT:  Normocephalic, atraumatic, oropharynx patent. Eye: Juliette Mangle [facility] Cardiovascular: Regular rate and rhythm, no rubs murmurs or gallops. No Carotid bruits, radial pulse intact. No pedal edema.  Respiratory: Clear to auscultation bilaterally. No wheezes, rales, or rhonchi. No cyanosis, no use of accessory musculature Abdominal: No organomegaly, abdomen is soft and non-tender, positive bowel sounds. No masses. Musculoskeletal: Gait intact. No tenderness. Trace edema.  Skin: No rashes. Neurologic: Facial musculature symmetric. Psychiatric: Patient acts appropriately throughout our interaction. Lymphatic: No cervical or submandibular lymphadenopathy  LABS: Results for orders placed or performed in visit on 01/03/16  POCT CBC   Result Value Ref Range   WBC 8.2 4.6 - 10.2 K/uL   Lymph, poc 2.0 0.6 - 3.4   POC LYMPH PERCENT 24.7 10 - 50 %L   MID (cbc) 0.5 0 - 0.9   POC MID % 6.0 0 - 12 %M   POC Granulocyte 5.7 2 - 6.9   Granulocyte percent 69.3 37 - 80 %G   RBC 5.09 4.04 - 5.48 M/uL   Hemoglobin 9.7 (A) 12.2 - 16.2 g/dL   HCT, POC 29.8 (A) 37.7 - 47.9 %   MCV 58.6 (A) 80 - 97 fL   MCH, POC 19.0 (A) 27 - 31.2 pg   MCHC 32.5 31.8 - 35.4 g/dL   RDW, POC 15.8 %   Platelet Count, POC 277 142 - 424 K/uL   MPV 8.7 0 - 99.8 fL  POCT glucose (manual entry)  Result Value Ref Range   POC Glucose  277 (A) 70 - 99 mg/dl  POCT glycosylated hemoglobin (Hb A1C)  Result Value Ref Range   Hemoglobin A1C 7.6        EKG/XRAY:   Primary read interpreted by Dr. Everlene Farrier at [facility]  ASSESSMENT/PLAN: Patient has a hemoglobin of 9.7 with microcytic indices. CK is a diagnosis of thalassemia. Her sugar is not under control at 277 with an A1c of 7.6. She had stopped her true elicited because of side effects.I personally performed the services described in this documentation, which was scribed in my presence. The recorded information has been reviewed and is accurate. Patient is willing to try to elucidate again. She wants to see if she can reestablish with Dr. Lorelei Pont at her new practice.I personally performed the services described in this documentation, which was scribed in my presence. The recorded information has been reviewed and is accurate.    Gross sideeffects, risk and benefits, and alternatives of medications d/w patient. Patient is aware that all medications have potential sideeffects and we are unable to predict every sideeffect or drug-drug interaction that may occur.  Arlyss Queen MD 01/03/2016 10:47 AM

## 2016-05-14 ENCOUNTER — Encounter: Payer: Self-pay | Admitting: Family Medicine

## 2016-05-14 ENCOUNTER — Ambulatory Visit (INDEPENDENT_AMBULATORY_CARE_PROVIDER_SITE_OTHER): Payer: BLUE CROSS/BLUE SHIELD | Admitting: Family Medicine

## 2016-05-14 VITALS — BP 141/75 | HR 76 | Temp 98.2°F | Ht 63.0 in | Wt 240.2 lb

## 2016-05-14 DIAGNOSIS — I10 Essential (primary) hypertension: Secondary | ICD-10-CM

## 2016-05-14 DIAGNOSIS — IMO0001 Reserved for inherently not codable concepts without codable children: Secondary | ICD-10-CM

## 2016-05-14 DIAGNOSIS — D582 Other hemoglobinopathies: Secondary | ICD-10-CM

## 2016-05-14 DIAGNOSIS — R195 Other fecal abnormalities: Secondary | ICD-10-CM

## 2016-05-14 DIAGNOSIS — E1165 Type 2 diabetes mellitus with hyperglycemia: Secondary | ICD-10-CM | POA: Diagnosis not present

## 2016-05-14 DIAGNOSIS — K625 Hemorrhage of anus and rectum: Secondary | ICD-10-CM | POA: Diagnosis not present

## 2016-05-14 LAB — CBC
HCT: 31.5 % — ABNORMAL LOW (ref 36.0–46.0)
Hemoglobin: 9.8 g/dL — ABNORMAL LOW (ref 12.0–15.0)
MCHC: 31.1 g/dL (ref 30.0–36.0)
MCV: 59.3 fl — ABNORMAL LOW (ref 78.0–100.0)
Platelets: 285 10*3/uL (ref 150.0–400.0)
RBC: 5.31 Mil/uL — ABNORMAL HIGH (ref 3.87–5.11)
RDW: 16.5 % — ABNORMAL HIGH (ref 11.5–15.5)
WBC: 8.6 10*3/uL (ref 4.0–10.5)

## 2016-05-14 LAB — VITAMIN B12: Vitamin B-12: 659 pg/mL (ref 211–911)

## 2016-05-14 LAB — HEMOGLOBIN A1C: Hgb A1c MFr Bld: 9.3 % — ABNORMAL HIGH (ref 4.6–6.5)

## 2016-05-14 MED ORDER — LOSARTAN POTASSIUM-HCTZ 100-12.5 MG PO TABS: ORAL_TABLET | ORAL | 3 refills | Status: DC

## 2016-05-14 NOTE — Patient Instructions (Signed)
I will arrange for a colonoscopy/ GI consultation for you. Let me know if you do not hear about this soon We will check your A1c and then discuss your medication.  I think we may need to start a 3rd med- we will decide this together It was great to see you today!  Let's plan to meet again in 3-4 months to check on your diabetes.

## 2016-05-14 NOTE — Progress Notes (Signed)
Pre visit review using our clinic review tool, if applicable. No additional management support is needed unless otherwise documented below in the visit note. 

## 2016-05-14 NOTE — Progress Notes (Addendum)
[place] at [facility] [address], [place], [place] [address] [phone]  Date:  05/14/2016   Name:  Lynn [name]   DOB:  [birth year]   MRN:  [id number]  PCP:  Lamar Blinks, MD    Chief Complaint: Diabetes (Pt would like to discuss diabetes has used Trulicity in the past would like to discuss other options. ) and Blood In Stools (c/o noticing occ blood in stool since last year. Pt states for the past 2-3 months she's noticed an increase in frequency. Only noticed after having a BM pt states that she has several BM's each day. )   History of Present Illness:  Lynn [name] is a 60 y.o. very pleasant female patient who presents with the following:  History of DM- here today to discuss her treatment for same.  I last saw her about one year ago for a pre-op clearance. She had her left knee replaced in November and it is doing well, still a bit sore but overall her pain is much better.  She had been on trulicity, but it caused stomach pain and it seemed to make her feel SOB,gave her diarrhea.  She stopped taking it over the last couple of months. These SE seems to have gone away completely but she is concerned that her glucose may be too high  She notes that her home glucose is running 130s- 140 in the am She has regained the weight she lost while on Trulicity.   She is aware of the need to lose weight and plans to work on exercise again Wt Readings from Last 3 Encounters:  05/14/16 240 lb 3.2 oz (109 kg)  01/03/16 233 lb (105.7 kg)  08/08/15 226 lb 6.4 oz (102.7 kg)      Lab Results  Component Value Date   HGBA1C 7.6 01/03/2016    She has also noted some blood in her stool- she has noted "runny BM for about one year" with spots of blood.  She only sees the blood with BM- never on it's own.  Blood is on the tissue.  She has noted this for about 1 year overall, more frequently for 3-4 months.   She is anemic but has thalassemia  so this is baseline She did have a colonoscopy in 2009. She plans to see [place] for a colonoscopy- she needs a referral for same.  Pneumovax done 2011.     Patient Active Problem List   Diagnosis Date Noted  . H/O total knee replacement 08/19/2015  . Vision, loss, sudden 03/07/2015  . S/P TKR (total knee replacement) using cement 02/18/2015  . Thalassemia 09/02/2013  . Hyperlipidemia LDL goal < 100 08/24/2012  . Diabetes mellitus ([place]) 07/22/2012  . Hypertension 07/22/2012    Past Medical History:  Diagnosis Date  . Anemia    thalasemia  . Arthritis   . Blood dyscrasia    thalasemia  . Diabetes mellitus without complication ([place])   . Eye hemorrhage 4/16   resolved rt  . Hypertension   . Shortness of breath dyspnea    exertion- none since stopped smoking  . Sleep apnea    use CPAP  . Ulcer     Past Surgical History:  Procedure Laterality Date  . ABDOMINAL HYSTERECTOMY    . arthroscopic knee Left 2-16  . CARPAL TUNNEL RELEASE Right 11/01/2014  . JOINT REPLACEMENT    . TONSILLECTOMY    . TOTAL KNEE ARTHROPLASTY Right 02/18/2015  Procedure: RIGHT TOTAL KNEE ARTHROPLASTY;  Surgeon: Netta Cedars, MD;  Location: [facility];  Service: Orthopedics;  Laterality: Right;  . TOTAL KNEE ARTHROPLASTY Left 08/19/2015   Procedure: LEFT TOTAL KNEE ARTHROPLASTY;  Surgeon: Netta Cedars, MD;  Location: [facility];  Service: Orthopedics;  Laterality: Left;    Social History  Substance Use Topics  . Smoking status: Former Smoker    Packs/day: 0.25    Years: 44.00    Types: Cigarettes    Quit date: 02/17/2015  . Smokeless tobacco: Never Used  . Alcohol use 0.0 oz/week     Comment: socially    Family History  Problem Relation Age of Onset  . Cancer Mother   . Diabetes Mother   . Hypertension Mother   . Hyperlipidemia Mother   . Cancer Father   . Hyperlipidemia Father   . Mental illness Sister   . Diabetes Sister   . Diabetes Maternal Grandmother   . Diabetes Maternal Grandfather      Allergies  Allergen Reactions  . Penicillins Hives    Has patient had a PCN reaction causing immediate rash, facial/tongue/throat swelling, SOB or lightheadedness with hypotension: Yes Has patient had a PCN reaction causing severe rash involving mucus membranes or skin necrosis: No Has patient had a PCN reaction that required hospitalization No Has patient had a PCN reaction occurring within the last 10 years: No If all of the above answers are "NO", then may proceed with Cephalosporin use.    Medication list has been reviewed and updated.  Current Outpatient Prescriptions on File Prior to Visit  Medication Sig Dispense Refill  . blood glucose meter kit and supplies KIT Use up to four times daily as directed.Dx: E11.9 1 each 0  . Blood Glucose Monitoring Suppl (ACCU-CHEK AVIVA PLUS) W/DEVICE KIT     . glipiZIDE (GLUCOTROL) 5 MG tablet Take 1 tablet (5 mg total) by mouth 2 (two) times daily before a meal. 180 tablet 1  . glucose blood test strip Use up to four times daily as directly. Dx E11.9 400 each 3  . losartan-hydrochlorothiazide (HYZAAR) 100-12.5 MG tablet TAKE 1 TABLET BY MOUTH DAILY 90 tablet 1  . metFORMIN (GLUCOPHAGE) 1000 MG tablet TAKE 1 TABLET BY MOUTH TWICE A DAY WITH A MEAL 180 tablet 1  . Multiple Vitamin (MULTIVITAMIN WITH MINERALS) TABS tablet Take 1 tablet by mouth daily. Reported on 01/03/2016    . pravastatin (PRAVACHOL) 20 MG tablet Take 1 tablet (20 mg total) by mouth daily. 90 tablet 3  . Dulaglutide (TRULICITY) 1.51 VO/1.6WV SOPN Inject 0.'75mg'$  into the skin weekly (Patient not taking: Reported on 05/14/2016) 12 pen 1   No current facility-administered medications on file prior to visit.     Review of Systems: No CP or SOB No weight loss- she has noted weight gain No fever or chills Normal appetite  As per HPI- otherwise negative.   Physical Examination: Vitals:   05/14/16 1103 05/14/16 1109  BP: (!) 142/73 (!) 141/75  Pulse: 76   Temp: 98.2 F  (36.8 C)    Vitals:   05/14/16 1103  Weight: 240 lb 3.2 oz (109 kg)  Height: '5\' 3"'$  (1.6 m)   Body mass index is 42.55 kg/m. Ideal Body Weight: Weight in (lb) to have BMI = 25: 140.8  GEN: WDWN, NAD, Non-toxic, A & O x 3, obese, looks well HEENT: Atraumatic, Normocephalic. Neck supple. No masses, No LAD. Ears and Nose: No external deformity. CV: RRR, No M/G/R. No JVD. No  thrill. No extra heart sounds. PULM: CTA B, no wheezes, crackles, rhonchi. No retractions. No resp. distress. No accessory muscle use. ABD: S, NT, ND, +BS. No rebound. No HSM.  Benign belly EXTR: No c/c/e NEURO Normal gait.  PSYCH: Normally interactive. Conversant. Not depressed or anxious appearing.  Calm demeanor.    Assessment and Plan: Uncontrolled type 2 diabetes mellitus without complication, without long-term current use of insulin ([place]) - Plan: Hemoglobin A1c  Rectal bleeding - Plan: Ambulatory referral to Gastroenterology, CBC  Other hemoglobinopathies ([place]) - Plan: B12  Essential hypertension - Plan: losartan-hydrochlorothiazide (HYZAAR) 100-12.5 MG tablet  Change in stool  Continue hyzaar and plan to recheck BP at next visit.  BP is generally under ok control She has been taking B12 and wants to check this level Persistent rectal bleeding; check CBC today.  Bleeding is scant so will defer exam to GI Due for a colonoscopy in any case She is interested in a medication such as invokana if she needs another DM medication Will plan further follow- up pending labs.   BP Readings from Last 3 Encounters:  05/14/16 (!) 141/75  01/03/16 136/82  08/22/15 (!) 111/53     Signed Lamar Blinks, MD  received her labs on 8/1-   Results for orders placed or performed in visit on 05/14/16  CBC  Result Value Ref Range   WBC 8.6 4.0 - 10.5 K/uL   RBC 5.31 (H) 3.87 - 5.11 Mil/uL   Platelets 285.0 150.0 - 400.0 K/uL   Hemoglobin 9.8 (L) 12.0 - 15.0 g/dL   HCT 31.5 (L) 36.0 - 46.0 %   MCV 59.3  Repeated and verified X2. (L) 78.0 - 100.0 fl   MCHC 31.1 30.0 - 36.0 g/dL   RDW 16.5 (H) 11.5 - 15.5 %  Hemoglobin A1c  Result Value Ref Range   Hgb A1c MFr Bld 9.3 (H) 4.6 - 6.5 %  B12  Result Value Ref Range   Vitamin B-12 659 211 - 911 pg/mL   Her hg a1c has gone up- will need to add another med.  Will suggest invokana for her  B12 is normal Hg is baseline for her- thalassemia.   Message to pt

## 2016-05-15 ENCOUNTER — Encounter: Payer: Self-pay | Admitting: Family Medicine

## 2016-05-15 ENCOUNTER — Encounter: Payer: Self-pay | Admitting: Gastroenterology

## 2016-05-16 MED ORDER — DULAGLUTIDE 0.75 MG/0.5ML ~~LOC~~ SOAJ: SUBCUTANEOUS | 1 refills | Status: DC

## 2016-05-17 ENCOUNTER — Telehealth: Payer: Self-pay | Admitting: Emergency Medicine

## 2016-05-17 MED ORDER — DULAGLUTIDE 0.75 MG/0.5ML ~~LOC~~ SOAJ: SUBCUTANEOUS | 12 refills | Status: DC

## 2016-05-17 NOTE — Telephone Encounter (Signed)
[employer] called to verify quantity of Trulicity pens requested on rx. Per [employer] anything over 4 pens is considered over the quantity limit. [employer] request that new rx with corrected quantity be faxed to [phone] with Reference key # : [id number].

## 2016-05-17 NOTE — Telephone Encounter (Signed)
Changed rx to 4 pens with 11 refills

## 2016-05-23 ENCOUNTER — Encounter: Payer: Self-pay | Admitting: Family Medicine

## 2016-05-24 ENCOUNTER — Other Ambulatory Visit: Payer: Self-pay | Admitting: Family Medicine

## 2016-05-24 DIAGNOSIS — Z1231 Encounter for screening mammogram for malignant neoplasm of breast: Secondary | ICD-10-CM

## 2016-05-24 MED ORDER — LIRAGLUTIDE 18 MG/3ML ~~LOC~~ SOPN: PEN_INJECTOR | SUBCUTANEOUS | 3 refills | Status: DC

## 2016-05-28 ENCOUNTER — Encounter: Payer: Self-pay | Admitting: Family Medicine

## 2016-05-28 MED ORDER — PEN NEEDLES 31G X 5 MM MISC: 1.0000 [IU] | Freq: Every day | 3 refills | Status: DC

## 2016-05-29 ENCOUNTER — Ambulatory Visit
Admission: RE | Admit: 2016-05-29 | Discharge: 2016-05-29 | Disposition: A | Discharge status: Home / Self Care | Payer: BLUE CROSS/BLUE SHIELD | Source: Ambulatory Visit | Attending: Family Medicine | Admitting: Family Medicine

## 2016-05-29 DIAGNOSIS — Z1231 Encounter for screening mammogram for malignant neoplasm of breast: Secondary | ICD-10-CM

## 2016-05-31 ENCOUNTER — Encounter: Payer: Self-pay | Admitting: Family Medicine

## 2016-05-31 DIAGNOSIS — E669 Obesity, unspecified: Principal | ICD-10-CM

## 2016-05-31 DIAGNOSIS — E1169 Type 2 diabetes mellitus with other specified complication: Secondary | ICD-10-CM

## 2016-06-04 MED ORDER — GLIPIZIDE 5 MG PO TABS: 5.0000 mg | ORAL_TABLET | Freq: Two times a day (BID) | ORAL | 3 refills | Status: DC

## 2016-06-04 MED ORDER — METFORMIN HCL 1000 MG PO TABS: ORAL_TABLET | ORAL | 3 refills | Status: DC

## 2016-06-18 ENCOUNTER — Other Ambulatory Visit: Payer: Self-pay | Admitting: Family Medicine

## 2016-06-20 MED ORDER — BLOOD GLUCOSE MONITOR KIT: PACK | 0 refills | Status: DC

## 2016-07-18 ENCOUNTER — Encounter: Payer: Self-pay | Admitting: Gastroenterology

## 2016-07-18 ENCOUNTER — Ambulatory Visit (INDEPENDENT_AMBULATORY_CARE_PROVIDER_SITE_OTHER): Payer: BLUE CROSS/BLUE SHIELD | Admitting: Gastroenterology

## 2016-07-18 VITALS — BP 130/76 | HR 72 | Ht 63.0 in | Wt 242.6 lb

## 2016-07-18 DIAGNOSIS — R197 Diarrhea, unspecified: Secondary | ICD-10-CM | POA: Diagnosis not present

## 2016-07-18 DIAGNOSIS — K921 Melena: Secondary | ICD-10-CM

## 2016-07-18 MED ORDER — NA SULFATE-K SULFATE-MG SULF 17.5-3.13-1.6 GM/177ML PO SOLN: 1.0000 | Freq: Once | ORAL | 0 refills | Status: AC

## 2016-07-18 NOTE — Patient Instructions (Signed)
You can use over the counter preparation H suppositories daily as needed.   Contact Dr. Lorelei Pont regarding stopping your metformin.   You have been scheduled for a colonoscopy. Please follow written instructions given to you at your visit today.  Please pick up your prep supplies at the pharmacy within the next 1-3 days. If you use inhalers (even only as needed), please bring them with you on the day of your procedure. Your physician has requested that you go to www.startemmi.com and enter the access code given to you at your visit today. This web site gives a general overview about your procedure. However, you should still follow specific instructions given to you by our office regarding your preparation for the procedure.  Normal BMI (Body Mass Index- based on height and weight) is between 19 and 25. Your BMI today is Body mass index is 42.97 kg/m. Marland Kitchen Please consider follow up  regarding your BMI with your Primary Care Provider.  Thank you for choosing me and [facility].  Pricilla Riffle. Dagoberto Ligas., MD., Marval Regal

## 2016-07-18 NOTE — Progress Notes (Signed)
    History of Present Illness: This is a 60 year old female referred by Copland, Gay Filler, MD for the evaluation of hematochezia and diarrhea. She states she had a colonoscopy around 2009 but no records are available in Epic. Does not recall any details related to her colonoscopy as far as the findings, GI MD or where the procedure was performed. She has chronic microcytic anemia felt to be secondary to thalassemia. She notes small amounts of BRBPR with bowel movements for 2 years. This occurs a couple times per week. She has had diarrhea for 2 years as well, usually 6 times per day. She missed 2 days of metformin a few weeks ago and her diarrhea resolved. Denies weight loss, abdominal pain, constipation, change in stool caliber, melena, nausea, vomiting, dysphagia, reflux symptoms, chest pain.  Review of Systems: Pertinent positive and negative review of systems were noted in the above HPI section. All other review of systems were otherwise negative.  Current Medications, Allergies, Past Medical History, Past Surgical History, Family History and Social History were reviewed in [employer] record.  Physical Exam: General: Well developed, well nourished, no acute distress Head: Normocephalic and atraumatic Eyes:  sclerae anicteric, EOMI Ears: Normal auditory acuity Mouth: No deformity or lesions Neck: Supple, no masses or thyromegaly Lungs: Clear throughout to auscultation Heart: Regular rate and rhythm; no murmurs, rubs or bruits Abdomen: Soft, non tender and non distended. No masses, hepatosplenomegaly or hernias noted. Normal Bowel sounds Rectal: mild tenderness on DRE, no lesion, trace heme pos brown stool Musculoskeletal: Symmetrical with no gross deformities  Skin: No lesions on visible extremities Pulses:  Normal pulses noted Extremities: No clubbing, cyanosis, edema or deformities noted Neurological: Alert oriented x 4, grossly nonfocal Cervical Nodes:  No  significant cervical adenopathy Inguinal Nodes: No significant inguinal adenopathy Psychological:  Alert and cooperative. Normal mood and affect  Assessment and Recommendations:  1. Diarrhea. Stongly suspect metformin related side effect. Patient is advised to contact Dr. Lorelei Pont to discuss temporarily discontinuing metformin and assess response. If diarrhea resolves she should remain off metformin.  2. Hematochezia. Rule out colitis, colorectal neoplasms, hemorrhoids and other disorders. Schedule colonoscopy. The risks (including bleeding, perforation, infection, missed lesions, medication reactions and possible hospitalization or surgery if complications occur), benefits, and alternatives to colonoscopy with possible biopsy and possible polypectomy were discussed with the patient and they consent to proceed.    cc: [name], MD [address] [place], [place] [address]

## 2016-07-20 ENCOUNTER — Encounter: Payer: Self-pay | Admitting: Gastroenterology

## 2016-07-30 ENCOUNTER — Encounter: Payer: BLUE CROSS/BLUE SHIELD | Admitting: Gastroenterology

## 2016-08-07 ENCOUNTER — Telehealth: Payer: Self-pay | Admitting: Gastroenterology

## 2016-08-07 NOTE — Telephone Encounter (Signed)
Patient states she needs a PA on Suprep. Informed patient we do not do PA of preps. Informed patient that I have a coupon I can give the pharmacy so she pays no more than 50 dollars. Patient verbalized understanding. Called pharmacy and had them run the coupon and it went through. Patient will go pick up prep.

## 2016-08-15 ENCOUNTER — Ambulatory Visit: Payer: BLUE CROSS/BLUE SHIELD | Admitting: Family Medicine

## 2016-08-23 ENCOUNTER — Ambulatory Visit (AMBULATORY_SURGERY_CENTER): Payer: BLUE CROSS/BLUE SHIELD | Admitting: Gastroenterology

## 2016-08-23 ENCOUNTER — Encounter: Payer: Self-pay | Admitting: Gastroenterology

## 2016-08-23 VITALS — BP 117/72 | HR 70 | Temp 96.8°F | Resp 15 | Ht 63.0 in | Wt 242.0 lb

## 2016-08-23 DIAGNOSIS — K635 Polyp of colon: Secondary | ICD-10-CM

## 2016-08-23 DIAGNOSIS — D123 Benign neoplasm of transverse colon: Secondary | ICD-10-CM | POA: Diagnosis not present

## 2016-08-23 DIAGNOSIS — K921 Melena: Secondary | ICD-10-CM

## 2016-08-23 LAB — GLUCOSE, CAPILLARY
Glucose-Capillary: 177 mg/dL — ABNORMAL HIGH (ref 65–99)
Glucose-Capillary: 141 mg/dL — ABNORMAL HIGH (ref 65–99)

## 2016-08-23 MED ORDER — SODIUM CHLORIDE 0.9 % IV SOLN: 500.0000 mL | INTRAVENOUS | Status: DC

## 2016-08-23 NOTE — Progress Notes (Signed)
Called to room to assist during endoscopic procedure.  Patient ID and intended procedure confirmed with present staff. Received instructions for my participation in the procedure from the performing physician.  

## 2016-08-23 NOTE — Progress Notes (Signed)
Patient awakening,vss,report to rn 

## 2016-08-23 NOTE — Op Note (Signed)
[facility] Patient Name: Lynn [name] Procedure Date: 08/23/2016 8:37 AM MRN: [id number] Endoscopist: Ladene Artist , MD Age: 60 Referring MD:  Date of Birth: 1956-04-12 Gender: Female Account #: [id number] Procedure:                Colonoscopy Indications:              Hematochezia Medicines:                Monitored Anesthesia Care Procedure:                Pre-Anesthesia Assessment:                           - Prior to the procedure, a History and Physical                            was performed, and patient medications and                            allergies were reviewed. The patient's tolerance of                            previous anesthesia was also reviewed. The risks                            and benefits of the procedure and the sedation                            options and risks were discussed with the patient.                            All questions were answered, and informed consent                            was obtained. Prior Anticoagulants: The patient has                            taken no previous anticoagulant or antiplatelet                            agents. ASA Grade Assessment: II - A patient with                            mild systemic disease. After reviewing the risks                            and benefits, the patient was deemed in                            satisfactory condition to undergo the procedure.                           After obtaining informed consent, the colonoscope  was passed under direct vision. Throughout the                            procedure, the patient's blood pressure, pulse, and                            oxygen saturations were monitored continuously. The                            Model PCF-[id number]) scope was introduced                            through the anus and advanced to the the cecum,                            identified by appendiceal orifice and  ileocecal                            valve. The ileocecal valve, appendiceal orifice,                            and rectum were photographed. The quality of the                            bowel preparation was good. The colonoscopy was                            performed without difficulty. The patient tolerated                            the procedure well. Scope In: 8:46:35 AM Scope Out: 9:02:47 AM Scope Withdrawal Time: 0 hours 13 minutes 33 seconds  Total Procedure Duration: 0 hours 16 minutes 12 seconds  Findings:                 The perianal and digital rectal examinations were                            normal.                           A 7 mm polyp was found in the transverse colon. The                            polyp was sessile. The polyp was removed with a                            cold snare. Resection and retrieval were complete.                           Multiple medium-mouthed diverticula were found in                            the sigmoid colon and descending colon. There was  narrowing of the colon in association with the                            diverticular opening. There was evidence of                            diverticular spasm. There was no evidence of                            diverticular bleeding.                           A few small-mouthed diverticula were found in the                            transverse colon.                           The exam was otherwise without abnormality on                            direct and retroflexion views. Complications:            No immediate complications. Estimated blood loss:                            None. Estimated Blood Loss:     Estimated blood loss: none. Impression:               - One 7 mm polyp in the transverse colon, removed                            with a cold snare. Resected and retrieved.                           - Moderate diverticulosis in the sigmoid colon and                             in the descending colon.                           - Diverticulosis in the transverse colon.                           - The examination was otherwise normal on direct                            and retroflexion views. Recommendation:           - Repeat colonoscopy in 5 years for surveillance.                           - Patient has a contact number available for                            emergencies. The signs and symptoms of potential  delayed complications were discussed with the                            patient. Return to normal activities tomorrow.                            Written discharge instructions were provided to the                            patient.                           - Resume previous diet.                           - Continue present medications.                           - Await pathology results. Ladene Artist, MD 08/23/2016 9:10:07 AM This report has been signed electronically.

## 2016-08-23 NOTE — Patient Instructions (Signed)
Impression/Recommendations:  Polyp handout given to patient. Diverticulosis handout given to patient.  Repeat colonoscopy in 5 years for surveillance.  YOU HAD AN ENDOSCOPIC PROCEDURE TODAY AT [facility]:   Refer to the procedure report that was given to you for any specific questions about what was found during the examination.  If the procedure report does not answer your questions, please call your gastroenterologist to clarify.  If you requested that your care partner not be given the details of your procedure findings, then the procedure report has been included in a sealed envelope for you to review at your convenience later.  YOU SHOULD EXPECT: Some feelings of bloating in the abdomen. Passage of more gas than usual.  Walking can help get rid of the air that was put into your GI tract during the procedure and reduce the bloating. If you had a lower endoscopy (such as a colonoscopy or flexible sigmoidoscopy) you may notice spotting of blood in your stool or on the toilet paper. If you underwent a bowel prep for your procedure, you may not have a normal bowel movement for a few days.  Please Note:  You might notice some irritation and congestion in your nose or some drainage.  This is from the oxygen used during your procedure.  There is no need for concern and it should clear up in a day or so.  SYMPTOMS TO REPORT IMMEDIATELY:   Following lower endoscopy (colonoscopy or flexible sigmoidoscopy):  Excessive amounts of blood in the stool  Significant tenderness or worsening of abdominal pains  Swelling of the abdomen that is new, acute  Fever of 100F or higher For urgent or emergent issues, a gastroenterologist can be reached at any hour by calling [phone].   DIET:  We do recommend a small meal at first, but then you may proceed to your regular diet.  Drink plenty of fluids but you should avoid alcoholic beverages for 24 hours.  ACTIVITY:  You should plan to  take it easy for the rest of today and you should NOT DRIVE or use heavy machinery until tomorrow (because of the sedation medicines used during the test).    FOLLOW UP: Our staff will call the number listed on your records the next business day following your procedure to check on you and address any questions or concerns that you may have regarding the information given to you following your procedure. If we do not reach you, we will leave a message.  However, if you are feeling well and you are not experiencing any problems, there is no need to return our call.  We will assume that you have returned to your regular daily activities without incident.  If any biopsies were taken you will be contacted by phone or by letter within the next 1-3 weeks.  Please call us at [phone] if you have not heard about the biopsies in 3 weeks.    SIGNATURES/CONFIDENTIALITY: You and/or your care partner have signed paperwork which will be entered into your electronic medical record.  These signatures attest to the fact that that the information above on your After Visit Summary has been reviewed and is understood.  Full responsibility of the confidentiality of this discharge information lies with you and/or your care-partner.

## 2016-08-24 ENCOUNTER — Telehealth: Payer: Self-pay | Admitting: *Deleted

## 2016-08-24 NOTE — Telephone Encounter (Signed)
  Follow up Call-  Call back number 08/23/2016  Post procedure Call Back phone  # [phone]  Permission to leave phone message Yes  Some recent data might be hidden     Patient questions:  Do you have a fever, pain , or abdominal swelling? No. Pain Score  0 *  Have you tolerated food without any problems? Yes.    Have you been able to return to your normal activities? No.  Do you have any questions about your discharge instructions: Diet   No. Medications  No. Follow up visit  No.  Do you have questions or concerns about your Care? No.  Actions: * If pain score is 4 or above: No action needed, pain <4.

## 2016-08-30 ENCOUNTER — Encounter: Payer: Self-pay | Admitting: Gastroenterology

## 2016-08-30 ENCOUNTER — Other Ambulatory Visit: Payer: Self-pay | Admitting: Family Medicine

## 2016-08-30 DIAGNOSIS — E7800 Pure hypercholesterolemia, unspecified: Secondary | ICD-10-CM

## 2016-09-08 ENCOUNTER — Other Ambulatory Visit: Payer: Self-pay | Admitting: Family Medicine

## 2016-09-13 ENCOUNTER — Ambulatory Visit (INDEPENDENT_AMBULATORY_CARE_PROVIDER_SITE_OTHER): Payer: BLUE CROSS/BLUE SHIELD | Admitting: Family Medicine

## 2016-09-13 VITALS — BP 128/68 | HR 76 | Temp 98.6°F | Wt 242.2 lb

## 2016-09-13 DIAGNOSIS — E785 Hyperlipidemia, unspecified: Secondary | ICD-10-CM

## 2016-09-13 DIAGNOSIS — I10 Essential (primary) hypertension: Secondary | ICD-10-CM | POA: Diagnosis not present

## 2016-09-13 DIAGNOSIS — D563 Thalassemia minor: Secondary | ICD-10-CM | POA: Diagnosis not present

## 2016-09-13 DIAGNOSIS — E1169 Type 2 diabetes mellitus with other specified complication: Secondary | ICD-10-CM

## 2016-09-13 DIAGNOSIS — E669 Obesity, unspecified: Secondary | ICD-10-CM

## 2016-09-13 LAB — CBC
HCT: 32.8 % — ABNORMAL LOW (ref 36.0–46.0)
Hemoglobin: 10.1 g/dL — ABNORMAL LOW (ref 12.0–15.0)
MCHC: 30.8 g/dL (ref 30.0–36.0)
MCV: 60.4 fl — ABNORMAL LOW (ref 78.0–100.0)
Platelets: 328 10*3/uL (ref 150.0–400.0)
RBC: 5.42 Mil/uL — ABNORMAL HIGH (ref 3.87–5.11)
RDW: 16.9 % — ABNORMAL HIGH (ref 11.5–15.5)
WBC: 9 10*3/uL (ref 4.0–10.5)

## 2016-09-13 LAB — COMPREHENSIVE METABOLIC PANEL
ALT: 27 U/L (ref 0–35)
AST: 20 U/L (ref 0–37)
Albumin: 4.6 g/dL (ref 3.5–5.2)
Alkaline Phosphatase: 96 U/L (ref 39–117)
BUN: 21 mg/dL (ref 6–23)
CO2: 27 mEq/L (ref 19–32)
Calcium: 10.2 mg/dL (ref 8.4–10.5)
Chloride: 99 mEq/L (ref 96–112)
Creatinine, Ser: 1.17 mg/dL (ref 0.40–1.20)
GFR: 60.15 mL/min (ref >60.00)
Glucose, Bld: 158 mg/dL — ABNORMAL HIGH (ref 70–99)
Potassium: 4 mEq/L (ref 3.5–5.1)
Sodium: 136 mEq/L (ref 135–145)
Total Bilirubin: 0.3 mg/dL (ref 0.2–1.2)
Total Protein: 8.3 g/dL (ref 6.0–8.3)

## 2016-09-13 LAB — HEMOGLOBIN A1C: Hgb A1c MFr Bld: 8.5 % — ABNORMAL HIGH (ref 4.6–6.5)

## 2016-09-13 MED ORDER — GLUCOSE BLOOD VI STRP: ORAL_STRIP | 3 refills | Status: DC

## 2016-09-13 NOTE — Progress Notes (Signed)
[place] at [facility] [address], [place], [place] [address] [phone]  Date:  09/13/2016   Name:  Lynn [name]   DOB:  1956-03-28   MRN:  [id number]  PCP:  Lamar Blinks, MD    Chief Complaint: No chief complaint on file.   History of Present Illness:  Lynn [name] is a 60 y.o. very pleasant female patient who presents with the following:  Here today to recheck her chronic health concerns- history of hyperlipidemia, DM, HTN, obesity, thalassemia.  Right now she is using metformin, glipizide, and victoza.  We tried trulicity but not affordable for her She does check her glucose at home but ran out of strips not long ago.  She has decreased her dose of metformin- she takes 1,000 mg once daily instead of twice  She plans to have her eyes checked soon.  Her husband has prostate cancer- they are doing a TURP on 12/11 per Dr. Alinda Money.    She is frustrated by difficulty with weight loss- she has tried to watch her diet and exercises as much as she can.  We discussed weight loss medications- she is more interested in possible weight loss surgery at this time Lab Results  Component Value Date   HGBA1C 9.3 (H) 05/14/2016   BP Readings from Last 3 Encounters:  09/13/16 128/68  08/23/16 117/72  07/18/16 130/76   Wt Readings from Last 3 Encounters:  09/13/16 242 lb 3.2 oz (109.9 kg)  08/23/16 242 lb (109.8 kg)  07/18/16 242 lb 9.6 oz (110 kg)     Patient Active Problem List   Diagnosis Date Noted  . H/O total knee replacement 08/19/2015  . Vision, loss, sudden 03/07/2015  . S/P TKR (total knee replacement) using cement 02/18/2015  . Thalassemia 09/02/2013  . Hyperlipidemia LDL goal < 100 08/24/2012  . Diabetes mellitus ([place]) 07/22/2012  . Hypertension 07/22/2012    Past Medical History:  Diagnosis Date  . Anemia    thalasemia  . Arthritis   . Blood dyscrasia    thalasemia  . Clotting disorder ([place])    blood clot in eye 2016  . Diabetes mellitus without complication ([place])   . Eye hemorrhage 4/16   resolved rt  . Hyperlipidemia   . Hypertension   . Shortness of breath dyspnea    exertion- none since stopped smoking  . Sleep apnea    use CPAP  . Ulcer [facility])     Past Surgical History:  Procedure Laterality Date  . ABDOMINAL HYSTERECTOMY    . arthroscopic knee Left 2-16  . CARPAL TUNNEL RELEASE Right 11/01/2014  . COLONOSCOPY    . JOINT REPLACEMENT    . TONSILLECTOMY    . TOTAL KNEE ARTHROPLASTY Right 02/18/2015   Procedure: RIGHT TOTAL KNEE ARTHROPLASTY;  Surgeon: Netta Cedars, MD;  Location: [facility];  Service: Orthopedics;  Laterality: Right;  . TOTAL KNEE ARTHROPLASTY Left 08/19/2015   Procedure: LEFT TOTAL KNEE ARTHROPLASTY;  Surgeon: Netta Cedars, MD;  Location: [facility];  Service: Orthopedics;  Laterality: Left;    Social History  Substance Use Topics  . Smoking status: Former Smoker    Packs/day: 0.25    Years: 44.00    Types: Cigarettes    Quit date: 02/17/2015  . Smokeless tobacco: Never Used  . Alcohol use 0.0 oz/week     Comment: socially    Family History  Problem Relation Age of Onset  . Cancer Mother   .  Diabetes Mother   . Hypertension Mother   . Hyperlipidemia Mother   . Cancer Father   . Hyperlipidemia Father   . Mental illness Sister   . Diabetes Sister   . Diabetes Maternal Grandmother   . Diabetes Maternal Grandfather   . Colon cancer Neg Hx   . Esophageal cancer Neg Hx   . Stomach cancer Neg Hx   . Rectal cancer Neg Hx     Allergies  Allergen Reactions  . Penicillins Hives    Has patient had a PCN reaction causing immediate rash, facial/tongue/throat swelling, SOB or lightheadedness with hypotension: Yes Has patient had a PCN reaction causing severe rash involving mucus membranes or skin necrosis: No Has patient had a PCN reaction that required hospitalization No Has patient had a PCN reaction occurring within the last 10 years: No If all of  the above answers are "NO", then may proceed with Cephalosporin use.    Medication list has been reviewed and updated.  Current Outpatient Prescriptions on File Prior to Visit  Medication Sig Dispense Refill  . blood glucose meter kit and supplies KIT Use up to four times daily as directed.Dx: E11.9 1 each 0  . Blood Glucose Monitoring Suppl (ACCU-CHEK AVIVA PLUS) W/DEVICE KIT     . glipiZIDE (GLUCOTROL) 5 MG tablet Take 1 tablet (5 mg total) by mouth 2 (two) times daily before a meal. 180 tablet 3  . Insulin Pen Needle (PEN NEEDLES) 31G X 5 MM MISC 1 Units by Does not apply route daily. For use with victoza pen 100 each 3  . losartan-hydrochlorothiazide (HYZAAR) 100-12.5 MG tablet TAKE 1 TABLET BY MOUTH DAILY 90 tablet 3  . metFORMIN (GLUCOPHAGE) 1000 MG tablet TAKE 1 TABLET BY MOUTH TWICE A DAY WITH A MEAL 180 tablet 3  . Multiple Vitamin (MULTIVITAMIN WITH MINERALS) TABS tablet Take 1 tablet by mouth daily. Reported on 01/03/2016    . Naproxen Sodium (ALEVE PO) Take by mouth as needed.    . pravastatin (PRAVACHOL) 20 MG tablet TAKE 1 TABLET (20 MG TOTAL) BY MOUTH DAILY. 90 tablet 2  . VICTOZA 18 MG/3ML SOPN START WITH 0.6 MG INJECTED [place] DAILY FOR ONE WEEK, THEN INCREASE TO 1.2 MG DAILY 6 mL 3   Current Facility-Administered Medications on File Prior to Visit  Medication Dose Route Frequency Provider Last Rate Last Dose  . 0.9 %  sodium chloride infusion  500 mL Intravenous Continuous Meryl Dare, MD        Review of Systems:  As per HPI- otherwise negative.   Physical Examination: Vitals:   09/13/16 1308  BP: 128/68  Pulse: 76  Temp: 98.6 F (37 C)   Vitals:   09/13/16 1308  Weight: 242 lb 3.2 oz (109.9 kg)   Body mass index is 42.9 kg/m. Ideal Body Weight:    GEN: WDWN, NAD, Non-toxic, A & O x 3, obese, looks well HEENT: Atraumatic, Normocephalic. Neck supple. No masses, No LAD. Ears and Nose: No external deformity. CV: RRR, No M/G/R. No JVD. No thrill. No extra  heart sounds. PULM: CTA B, no wheezes, crackles, rhonchi. No retractions. No resp. distress. No accessory muscle use. ABD: S, NT, ND EXTR: No c/c/e NEURO Normal gait.  PSYCH: Normally interactive. Conversant. Not depressed or anxious appearing.  Calm demeanor.    Assessment and Plan: Diabetes mellitus type 2 in obese (HCC) - Plan: Comprehensive metabolic panel, Hemoglobin A1c  Thalassemia trait - Plan: CBC  Essential hypertension  Dyslipidemia  Obesity  with serious comorbidity, unspecified classification, unspecified obesity type  Here today to check on her DM- will get an A1c for her today Recent reassuring colonoscopy- one 80m polyp, repeat in 5 years Lipids checked in the spring, continue statin BP under good control She will do more research and let me know if she would like a referral to bariatric surgery Plan recheck based on A1c   Signed JLamar Blinks MD

## 2016-09-13 NOTE — Patient Instructions (Signed)
I will be in touch with your labs asap We will check on your A1c today!  I hope that all goes well with your husband's operation Please attend the weight loss surgery seminal (in person or online) and then let me know if you would like a referral to the bariatric surgery program

## 2016-09-13 NOTE — Progress Notes (Signed)
Pre visit review using our clinic review tool, if applicable. No additional management support is needed unless otherwise documented below in the visit note. 

## 2016-10-16 ENCOUNTER — Encounter (HOSPITAL_COMMUNITY): Payer: Self-pay | Admitting: Emergency Medicine

## 2016-10-16 ENCOUNTER — Emergency Department (HOSPITAL_COMMUNITY)
Admission: EM | Admit: 2016-10-16 | Discharge: 2016-10-17 | Disposition: A | Discharge status: Home / Self Care | Payer: BLUE CROSS/BLUE SHIELD | Attending: Emergency Medicine | Admitting: Emergency Medicine

## 2016-10-16 DIAGNOSIS — S61001A Unspecified open wound of right thumb without damage to nail, initial encounter: Secondary | ICD-10-CM | POA: Diagnosis not present

## 2016-10-16 DIAGNOSIS — S61411A Laceration without foreign body of right hand, initial encounter: Secondary | ICD-10-CM

## 2016-10-16 DIAGNOSIS — Y939 Activity, unspecified: Secondary | ICD-10-CM | POA: Diagnosis not present

## 2016-10-16 DIAGNOSIS — Z96653 Presence of artificial knee joint, bilateral: Secondary | ICD-10-CM | POA: Diagnosis not present

## 2016-10-16 DIAGNOSIS — Z87891 Personal history of nicotine dependence: Secondary | ICD-10-CM | POA: Diagnosis not present

## 2016-10-16 DIAGNOSIS — Y929 Unspecified place or not applicable: Secondary | ICD-10-CM | POA: Insufficient documentation

## 2016-10-16 DIAGNOSIS — W274XXA Contact with kitchen utensil, initial encounter: Secondary | ICD-10-CM | POA: Insufficient documentation

## 2016-10-16 DIAGNOSIS — E119 Type 2 diabetes mellitus without complications: Secondary | ICD-10-CM | POA: Diagnosis not present

## 2016-10-16 DIAGNOSIS — Z23 Encounter for immunization: Secondary | ICD-10-CM | POA: Diagnosis not present

## 2016-10-16 DIAGNOSIS — I10 Essential (primary) hypertension: Secondary | ICD-10-CM | POA: Insufficient documentation

## 2016-10-16 DIAGNOSIS — Z79899 Other long term (current) drug therapy: Secondary | ICD-10-CM | POA: Insufficient documentation

## 2016-10-16 DIAGNOSIS — Y999 Unspecified external cause status: Secondary | ICD-10-CM | POA: Insufficient documentation

## 2016-10-16 DIAGNOSIS — Z794 Long term (current) use of insulin: Secondary | ICD-10-CM | POA: Insufficient documentation

## 2016-10-16 DIAGNOSIS — S61011A Laceration without foreign body of right thumb without damage to nail, initial encounter: Secondary | ICD-10-CM | POA: Diagnosis present

## 2016-10-16 NOTE — ED Triage Notes (Signed)
Pt presents with right thumb laceration after peeling vegetables that occurred around 1700 this evening.  Has not been able to control bleeding since.  Small slice of skin noted to be missing on right thumb.  Patient ambulatory.  No other complaints at this time.

## 2016-10-17 MED ORDER — BACITRACIN ZINC 500 UNIT/GM EX OINT
TOPICAL_OINTMENT | Freq: Two times a day (BID) | CUTANEOUS | Status: DC
  Administered 2016-10-17: 1 via TOPICAL
  Filled 2016-10-17: qty 0.9

## 2016-10-17 MED ORDER — CEPHALEXIN 500 MG PO CAPS: 500.0000 mg | ORAL_CAPSULE | Freq: Two times a day (BID) | ORAL | 0 refills | Status: DC

## 2016-10-17 MED ORDER — TETANUS-DIPHTH-ACELL PERTUSSIS 5-2.5-18.5 LF-MCG/0.5 IM SUSP
0.5000 mL | Freq: Once | INTRAMUSCULAR | Status: AC
  Administered 2016-10-17: 0.5 mL via INTRAMUSCULAR
  Filled 2016-10-17: qty 0.5

## 2016-10-17 MED ORDER — LIDOCAINE HCL 2 % IJ SOLN
20.0000 mL | Freq: Once | INTRAMUSCULAR | Status: AC
  Administered 2016-10-17: 400 mg
  Filled 2016-10-17: qty 20

## 2016-10-17 NOTE — Discharge Instructions (Signed)
Please read attached information. If you experience any new or worsening signs or symptoms please return to the emergency room for evaluation. Please follow-up with your primary care provider or specialist as discussed. Please use medication prescribed only as directed and discontinue taking if you have any concerning signs or symptoms.   °

## 2016-10-17 NOTE — ED Notes (Signed)
Patient d/c'd self care.  F/U and medications reviewed.  Patient verbalized understanding. 

## 2016-10-17 NOTE — ED Provider Notes (Signed)
[facility] DEPT Provider Note   CSN: [id number] Arrival date & time: 10/16/16  2316     History   Chief Complaint Chief Complaint  Patient presents with  . Thumb Laceration    HPI Lynn [name] is a 61 y.o. female.  HPI 61 year old female presents today with a laceration to her right thumb. Patient notes that she was using a vegetable slicer when she sliced part of her finger off. She notes bleeding was minimally controlled with direct pressure, no significant blood loss. She denies decreased range of motion of the finger. She reports she is diabetic somewhat controlled,not on blood thinners, unknown tetanus status.  Past Medical History:  Diagnosis Date  . Anemia    thalasemia  . Arthritis   . Blood dyscrasia    thalasemia  . Clotting disorder ([place])    blood clot in eye 2016  . Diabetes mellitus without complication ([place])   . Eye hemorrhage 4/16   resolved rt  . Hyperlipidemia   . Hypertension   . Shortness of breath dyspnea    exertion- none since stopped smoking  . Sleep apnea    use CPAP  . Ulcer [facility])     Patient Active Problem List   Diagnosis Date Noted  . H/O total knee replacement 08/19/2015  . Vision, loss, sudden 03/07/2015  . S/P TKR (total knee replacement) using cement 02/18/2015  . Thalassemia 09/02/2013  . Hyperlipidemia LDL goal < 100 08/24/2012  . Diabetes mellitus ([place]) 07/22/2012  . Hypertension 07/22/2012    Past Surgical History:  Procedure Laterality Date  . ABDOMINAL HYSTERECTOMY    . arthroscopic knee Left 2-16  . CARPAL TUNNEL RELEASE Right 11/01/2014  . COLONOSCOPY    . JOINT REPLACEMENT    . TONSILLECTOMY    . TOTAL KNEE ARTHROPLASTY Right 02/18/2015   Procedure: RIGHT TOTAL KNEE ARTHROPLASTY;  Surgeon: Netta Cedars, MD;  Location: [facility];  Service: Orthopedics;  Laterality: Right;  . TOTAL KNEE ARTHROPLASTY Left 08/19/2015   Procedure: LEFT TOTAL KNEE ARTHROPLASTY;  Surgeon: Netta Cedars, MD;  Location: [facility];  Service:  Orthopedics;  Laterality: Left;    OB History    No data available       Home Medications    Prior to Admission medications   Medication Sig Start Date End Date Taking? Authorizing Provider  blood glucose meter kit and supplies KIT Use up to four times daily as directed.Dx: E11.9 06/20/16   [name], MD  Blood Glucose Monitoring Suppl (ACCU-CHEK AVIVA PLUS) W/DEVICE KIT  01/19/15   Historical Provider, MD  cephALEXin (KEFLEX) 500 MG capsule Take 1 capsule (500 mg total) by mouth 2 (two) times daily. 10/17/16   Okey Regal, PA-C  glipiZIDE (GLUCOTROL) 5 MG tablet Take 1 tablet (5 mg total) by mouth 2 (two) times daily before a meal. 06/04/16   Gay Filler Copland, MD  glucose blood test strip Use up to four times daily as directly. Dx E11.9 09/13/16   [name], MD  Insulin Pen Needle (PEN NEEDLES) 31G X 5 MM MISC 1 Units by Does not apply route daily. For use with victoza pen 05/28/16   [name], MD  losartan-hydrochlorothiazide (HYZAAR) 100-12.5 MG tablet TAKE 1 TABLET BY MOUTH DAILY 05/14/16   Gay Filler Copland, MD  metFORMIN (GLUCOPHAGE) 1000 MG tablet TAKE 1 TABLET BY MOUTH TWICE A DAY WITH A MEAL 06/04/16   [name], MD  Multiple Vitamin (MULTIVITAMIN WITH MINERALS) TABS tablet Take 1 tablet  by mouth daily. Reported on 01/03/2016    Historical Provider, MD  Naproxen Sodium (ALEVE PO) Take by mouth as needed.    Historical Provider, MD  pravastatin (PRAVACHOL) 20 MG tablet TAKE 1 TABLET (20 MG TOTAL) BY MOUTH DAILY. 08/30/16   Gay Filler Copland, MD  VICTOZA 18 MG/3ML SOPN START WITH 0.6 MG INJECTED [place] DAILY FOR ONE WEEK, THEN INCREASE TO 1.2 MG DAILY 09/10/16   [name], MD    Family History Family History  Problem Relation Age of Onset  . Cancer Mother   . Diabetes Mother   . Hypertension Mother   . Hyperlipidemia Mother   . Cancer Father   . Hyperlipidemia Father   . Mental illness Sister   . Diabetes Sister   . Diabetes Maternal  Grandmother   . Diabetes Maternal Grandfather   . Colon cancer Neg Hx   . Esophageal cancer Neg Hx   . Stomach cancer Neg Hx   . Rectal cancer Neg Hx     Social History Social History  Substance Use Topics  . Smoking status: Former Smoker    Packs/day: 0.25    Years: 44.00    Types: Cigarettes    Quit date: 02/17/2015  . Smokeless tobacco: Never Used  . Alcohol use 0.0 oz/week     Comment: socially     Allergies   Penicillins   Review of Systems Review of Systems  All other systems reviewed and are negative.    Physical Exam Updated Vital Signs BP 138/72 (BP Location: Left Arm)   Pulse 81   Temp 97.9 F (36.6 C) (Oral)   Resp 17   Ht 5' (1.524 m)   Wt 108.9 kg   SpO2 100%   BMI 46.87 kg/m   Physical Exam  Constitutional: She is oriented to person, place, and time. She appears well-developed and well-nourished.  HENT:  Head: Normocephalic and atraumatic.  Eyes: Conjunctivae are normal. Pupils are equal, round, and reactive to light. Right eye exhibits no discharge. Left eye exhibits no discharge. No scleral icterus.  Neck: Normal range of motion. No JVD present. No tracheal deviation present.  Pulmonary/Chest: Effort normal. No stridor.  Musculoskeletal:  Skin avulsion to the right radial thumb, complete thickness, no deep space involvement. Bleeding controlled at the time of my evaluation  Neurological: She is alert and oriented to person, place, and time. Coordination normal.  Psychiatric: She has a normal mood and affect. Her behavior is normal. Judgment and thought content normal.  Nursing note and vitals reviewed.    ED Treatments / Results  Labs (all labs ordered are listed, but only abnormal results are displayed) Labs Reviewed - No data to display  EKG  EKG Interpretation None       Radiology No results found.  Procedures Procedures (including critical care time)  NERVE BLOCK Performed by: Elmer Ramp Consent: Verbal consent  obtained. Required items: required blood products, implants, devices, and special equipment available Time out: Immediately prior to procedure a "time out" was called to verify the correct patient, procedure, equipment, support staff and site/side marked as required.  Indication: skin avulsion Nerve block body site: right thumb  Preparation: Patient was prepped and draped in the usual sterile fashion. Needle gauge: 24 G Location technique: anatomical landmarks  Local anesthetic: lidocaine  Anesthetic total: 5 ml  Outcome: pain improved Patient tolerance: Patient tolerated the procedure well with no immediate complications.  Medications Ordered in ED Medications  bacitracin ointment (1 application Topical  Given 10/17/16 0103)  lidocaine (XYLOCAINE) 2 % (with pres) injection 400 mg (400 mg Infiltration Given 10/17/16 0103)  Tdap (BOOSTRIX) injection 0.5 mL (0.5 mLs Intramuscular Given 10/17/16 0103)     Initial Impression / Assessment and Plan / ED Course  I have reviewed the triage vital signs and the nursing notes.  Pertinent labs & imaging results that were available during my care of the patient were reviewed by me and considered in my medical decision making (see chart for details).  Clinical Course     Patient presents with skin avulsion to the right thumb. Wound was copiously irrigated, no signs of deep space involvement. Full active range of motion of the thumb. Bacitracin was placed, nonocclusive dressing applied, wound care instructions given, tetanus updated, and prophylactic Keflex was given as patient is a diabetic. Patient tolerated procedure well, she will be discharged home with primary follow-up, and strict return precautions She verbalized understanding and  Agreement to today's plan and had no further questions or concerns at the time discharge.  Final Clinical Impressions(s) / ED Diagnoses   Final diagnoses:  Laceration of right hand, foreign body presence  unspecified, initial encounter    New Prescriptions New Prescriptions   CEPHALEXIN (KEFLEX) 500 MG CAPSULE    Take 1 capsule (500 mg total) by mouth 2 (two) times daily.     Okey Regal, PA-C 10/17/16 [place], MD 10/17/16 [phone]

## 2016-10-27 ENCOUNTER — Encounter: Payer: Self-pay | Admitting: Family Medicine

## 2016-11-16 ENCOUNTER — Inpatient Hospital Stay (HOSPITAL_BASED_OUTPATIENT_CLINIC_OR_DEPARTMENT_OTHER)
Admission: EM | Admit: 2016-11-16 | Discharge: 2016-11-20 | DRG: 281 | Disposition: A | Discharge status: Home / Self Care | Payer: BLUE CROSS/BLUE SHIELD | Attending: Cardiology | Admitting: Cardiology

## 2016-11-16 ENCOUNTER — Emergency Department (HOSPITAL_BASED_OUTPATIENT_CLINIC_OR_DEPARTMENT_OTHER): Payer: BLUE CROSS/BLUE SHIELD

## 2016-11-16 ENCOUNTER — Encounter (HOSPITAL_BASED_OUTPATIENT_CLINIC_OR_DEPARTMENT_OTHER): Payer: Self-pay | Admitting: Emergency Medicine

## 2016-11-16 ENCOUNTER — Telehealth: Payer: Self-pay | Admitting: Family Medicine

## 2016-11-16 ENCOUNTER — Ambulatory Visit (INDEPENDENT_AMBULATORY_CARE_PROVIDER_SITE_OTHER): Payer: BLUE CROSS/BLUE SHIELD | Admitting: Medical

## 2016-11-16 DIAGNOSIS — Z818 Family history of other mental and behavioral disorders: Secondary | ICD-10-CM | POA: Diagnosis not present

## 2016-11-16 DIAGNOSIS — I2510 Atherosclerotic heart disease of native coronary artery without angina pectoris: Secondary | ICD-10-CM

## 2016-11-16 DIAGNOSIS — I214 Non-ST elevation (NSTEMI) myocardial infarction: Secondary | ICD-10-CM | POA: Diagnosis present

## 2016-11-16 DIAGNOSIS — Z7982 Long term (current) use of aspirin: Secondary | ICD-10-CM

## 2016-11-16 DIAGNOSIS — Z8249 Family history of ischemic heart disease and other diseases of the circulatory system: Secondary | ICD-10-CM

## 2016-11-16 DIAGNOSIS — N179 Acute kidney failure, unspecified: Secondary | ICD-10-CM | POA: Diagnosis present

## 2016-11-16 DIAGNOSIS — I219 Acute myocardial infarction, unspecified: Secondary | ICD-10-CM | POA: Diagnosis present

## 2016-11-16 DIAGNOSIS — Z90710 Acquired absence of both cervix and uterus: Secondary | ICD-10-CM | POA: Diagnosis not present

## 2016-11-16 DIAGNOSIS — Z833 Family history of diabetes mellitus: Secondary | ICD-10-CM | POA: Diagnosis not present

## 2016-11-16 DIAGNOSIS — R079 Chest pain, unspecified: Secondary | ICD-10-CM | POA: Diagnosis present

## 2016-11-16 DIAGNOSIS — Z96653 Presence of artificial knee joint, bilateral: Secondary | ICD-10-CM | POA: Diagnosis present

## 2016-11-16 DIAGNOSIS — D569 Thalassemia, unspecified: Secondary | ICD-10-CM | POA: Diagnosis present

## 2016-11-16 DIAGNOSIS — E119 Type 2 diabetes mellitus without complications: Secondary | ICD-10-CM | POA: Diagnosis present

## 2016-11-16 DIAGNOSIS — I4719 Other supraventricular tachycardia: Secondary | ICD-10-CM

## 2016-11-16 DIAGNOSIS — E669 Obesity, unspecified: Secondary | ICD-10-CM | POA: Diagnosis present

## 2016-11-16 DIAGNOSIS — Z6841 Body Mass Index (BMI) 40.0 and over, adult: Secondary | ICD-10-CM | POA: Diagnosis not present

## 2016-11-16 DIAGNOSIS — E1169 Type 2 diabetes mellitus with other specified complication: Secondary | ICD-10-CM

## 2016-11-16 DIAGNOSIS — I471 Supraventricular tachycardia: Secondary | ICD-10-CM | POA: Diagnosis not present

## 2016-11-16 DIAGNOSIS — I10 Essential (primary) hypertension: Secondary | ICD-10-CM | POA: Diagnosis present

## 2016-11-16 DIAGNOSIS — E785 Hyperlipidemia, unspecified: Secondary | ICD-10-CM | POA: Diagnosis present

## 2016-11-16 DIAGNOSIS — Z23 Encounter for immunization: Secondary | ICD-10-CM

## 2016-11-16 DIAGNOSIS — G4733 Obstructive sleep apnea (adult) (pediatric): Secondary | ICD-10-CM | POA: Diagnosis present

## 2016-11-16 DIAGNOSIS — Z79899 Other long term (current) drug therapy: Secondary | ICD-10-CM | POA: Diagnosis not present

## 2016-11-16 DIAGNOSIS — Z7984 Long term (current) use of oral hypoglycemic drugs: Secondary | ICD-10-CM | POA: Diagnosis not present

## 2016-11-16 DIAGNOSIS — Z87891 Personal history of nicotine dependence: Secondary | ICD-10-CM

## 2016-11-16 HISTORY — DX: Angina pectoris, unspecified: I20.9

## 2016-11-16 LAB — TROPONIN I
Troponin I: 2.08 ng/mL (ref <0.03)
Troponin I: 2.38 ng/mL (ref <0.03)
Troponin I: 2.03 ng/mL (ref <0.03)
Troponin I: 1.67 ng/mL (ref <0.03)

## 2016-11-16 LAB — CBC
HCT: 30.7 % — ABNORMAL LOW (ref 36.0–46.0)
Hemoglobin: 9.5 g/dL — ABNORMAL LOW (ref 12.0–15.0)
MCH: 18.6 pg — ABNORMAL LOW (ref 26.0–34.0)
MCHC: 30.9 g/dL (ref 30.0–36.0)
MCV: 60.1 fL — ABNORMAL LOW (ref 78.0–100.0)
Platelets: 316 10*3/uL (ref 150–400)
RBC: 5.11 MIL/uL (ref 3.87–5.11)
RDW: 16.3 % — ABNORMAL HIGH (ref 11.5–15.5)
WBC: 8.5 10*3/uL (ref 4.0–10.5)

## 2016-11-16 LAB — HEPARIN LEVEL (UNFRACTIONATED): Heparin Unfractionated: 0.12 IU/mL — ABNORMAL LOW (ref 0.30–0.70)

## 2016-11-16 LAB — COMPREHENSIVE METABOLIC PANEL
ALT: 25 U/L (ref 14–54)
AST: 38 U/L (ref 15–41)
Albumin: 4.1 g/dL (ref 3.5–5.0)
Alkaline Phosphatase: 77 U/L (ref 38–126)
Anion gap: 9 (ref 5–15)
BUN: 17 mg/dL (ref 6–20)
CO2: 27 mmol/L (ref 22–32)
Calcium: 9.7 mg/dL (ref 8.9–10.3)
Chloride: 100 mmol/L — ABNORMAL LOW (ref 101–111)
Creatinine, Ser: 1.15 mg/dL — ABNORMAL HIGH (ref 0.44–1.00)
GFR calc Af Amer: 58 mL/min — ABNORMAL LOW (ref >60)
GFR calc non Af Amer: 50 mL/min — ABNORMAL LOW (ref >60)
Glucose, Bld: 205 mg/dL — ABNORMAL HIGH (ref 65–99)
Potassium: 3.8 mmol/L (ref 3.5–5.1)
Sodium: 136 mmol/L (ref 135–145)
Total Bilirubin: 0.4 mg/dL (ref 0.3–1.2)
Total Protein: 8 g/dL (ref 6.5–8.1)

## 2016-11-16 LAB — GLUCOSE, CAPILLARY: Glucose-Capillary: 179 mg/dL — ABNORMAL HIGH (ref 65–99)

## 2016-11-16 MED ORDER — HEPARIN (PORCINE) IN NACL 100-0.45 UNIT/ML-% IJ SOLN: 14.0000 [IU]/kg/h | Freq: Once | INTRAMUSCULAR | Status: DC

## 2016-11-16 MED ORDER — HEPARIN (PORCINE) IN NACL 100-0.45 UNIT/ML-% IJ SOLN
1300.0000 [IU]/h | INTRAMUSCULAR | Status: DC
  Administered 2016-11-16: 850 [IU]/h via INTRAVENOUS
  Administered 2016-11-17 – 2016-11-19 (×3): 1300 [IU]/h via INTRAVENOUS
  Filled 2016-11-16 (×4): qty 250

## 2016-11-16 MED ORDER — NITROGLYCERIN 0.4 MG SL SUBL
0.4000 mg | SUBLINGUAL_TABLET | SUBLINGUAL | Status: DC | PRN
  Administered 2016-11-16 – 2016-11-18 (×4): 0.4 mg via SUBLINGUAL
  Filled 2016-11-16 (×2): qty 1

## 2016-11-16 MED ORDER — ACETAMINOPHEN 325 MG PO TABS: 650.0000 mg | ORAL_TABLET | ORAL | Status: DC | PRN

## 2016-11-16 MED ORDER — NITROGLYCERIN 0.4 MG SL SUBL: 0.4000 mg | SUBLINGUAL_TABLET | SUBLINGUAL | Status: DC | PRN

## 2016-11-16 MED ORDER — INSULIN ASPART 100 UNIT/ML ~~LOC~~ SOLN
0.0000 [IU] | Freq: Three times a day (TID) | SUBCUTANEOUS | Status: DC
  Administered 2016-11-17: 2 [IU] via SUBCUTANEOUS
  Administered 2016-11-17: 3 [IU] via SUBCUTANEOUS
  Administered 2016-11-17: 5 [IU] via SUBCUTANEOUS
  Administered 2016-11-18: 3 [IU] via SUBCUTANEOUS
  Administered 2016-11-18: 2 [IU] via SUBCUTANEOUS
  Administered 2016-11-18: 3 [IU] via SUBCUTANEOUS
  Administered 2016-11-19: 2 [IU] via SUBCUTANEOUS
  Administered 2016-11-19: 5 [IU] via SUBCUTANEOUS
  Administered 2016-11-20: 3 [IU] via SUBCUTANEOUS

## 2016-11-16 MED ORDER — INSULIN ASPART 100 UNIT/ML ~~LOC~~ SOLN
0.0000 [IU] | Freq: Every day | SUBCUTANEOUS | Status: DC
  Administered 2016-11-18: 2 [IU] via SUBCUTANEOUS

## 2016-11-16 MED ORDER — METOPROLOL TARTRATE 12.5 MG HALF TABLET
12.5000 mg | ORAL_TABLET | Freq: Two times a day (BID) | ORAL | Status: DC
  Administered 2016-11-16 – 2016-11-18 (×5): 12.5 mg via ORAL
  Filled 2016-11-16 (×5): qty 1

## 2016-11-16 MED ORDER — ASPIRIN 81 MG PO CHEW
324.0000 mg | CHEWABLE_TABLET | Freq: Once | ORAL | Status: AC
  Administered 2016-11-16: 324 mg via ORAL
  Filled 2016-11-16: qty 4

## 2016-11-16 MED ORDER — LOSARTAN POTASSIUM-HCTZ 100-12.5 MG PO TABS: 1.0000 | ORAL_TABLET | Freq: Every day | ORAL | Status: DC

## 2016-11-16 MED ORDER — ONDANSETRON HCL 4 MG/2ML IJ SOLN: 4.0000 mg | Freq: Four times a day (QID) | INTRAMUSCULAR | Status: DC | PRN

## 2016-11-16 MED ORDER — HYDROCHLOROTHIAZIDE 12.5 MG PO CAPS
12.5000 mg | ORAL_CAPSULE | Freq: Every day | ORAL | Status: DC
  Administered 2016-11-16: 12.5 mg via ORAL
  Filled 2016-11-16: qty 1

## 2016-11-16 MED ORDER — LOSARTAN POTASSIUM 50 MG PO TABS
100.0000 mg | ORAL_TABLET | Freq: Every day | ORAL | Status: DC
  Administered 2016-11-16 – 2016-11-19 (×4): 100 mg via ORAL
  Filled 2016-11-16 (×4): qty 2

## 2016-11-16 MED ORDER — ATORVASTATIN CALCIUM 40 MG PO TABS
40.0000 mg | ORAL_TABLET | Freq: Every day | ORAL | Status: DC
  Administered 2016-11-16 – 2016-11-17 (×2): 40 mg via ORAL
  Filled 2016-11-16 (×2): qty 1

## 2016-11-16 MED ORDER — INFLUENZA VAC SPLIT QUAD 0.5 ML IM SUSY
0.5000 mL | PREFILLED_SYRINGE | INTRAMUSCULAR | Status: AC
  Administered 2016-11-17: 0.5 mL via INTRAMUSCULAR
  Filled 2016-11-16: qty 0.5

## 2016-11-16 MED ORDER — HEPARIN SODIUM (PORCINE) 5000 UNIT/ML IJ SOLN
4000.0000 [IU] | Freq: Once | INTRAMUSCULAR | Status: AC
  Administered 2016-11-16: 4000 [IU] via INTRAVENOUS
  Filled 2016-11-16: qty 1

## 2016-11-16 MED ORDER — ASPIRIN EC 81 MG PO TBEC
81.0000 mg | DELAYED_RELEASE_TABLET | Freq: Every day | ORAL | Status: DC
  Administered 2016-11-17 – 2016-11-18 (×2): 81 mg via ORAL
  Filled 2016-11-16 (×2): qty 1

## 2016-11-16 NOTE — Telephone Encounter (Signed)
Relation to [id number] Call back [phone]  Reason for call:  Patient transferred to team health due to her scheduling appointment thru my chart for "shortness of breath and mild chest pain"

## 2016-11-16 NOTE — Telephone Encounter (Signed)
Pt currently at [place] ED.

## 2016-11-16 NOTE — ED Triage Notes (Signed)
Pt c/o SHOB w/ exertion x months; CP started yesterday; sts felt like someone was sitting on chest (across entire chest), w/ radiation to LUE; sts pain was worse when she was up moving around

## 2016-11-16 NOTE — H&P (Signed)
History & Physical    Patient ID: Lynn [name] MRN: [id number], DOB/AGE: 1956/02/23   Admit date: 11/16/2016  Primary Physician: Lamar Blinks, MD Primary Cardiologist: New  Patient Profile    61 yo female with PMH of HTN, HL, NIDDM, OSA on Cpap who presented to [facility] with exertional chest pain and dyspnea.   Past Medical History    Past Medical History:  Diagnosis Date  . Anemia    thalasemia  . Arthritis   . Blood dyscrasia    thalasemia  . Clotting disorder ([place])    blood clot in eye 2016  . Diabetes mellitus without complication ([place])   . Eye hemorrhage 4/16   resolved rt  . Hyperlipidemia   . Hypertension   . Shortness of breath dyspnea    exertion- none since stopped smoking  . Sleep apnea    use CPAP  . Ulcer [facility])     Past Surgical History:  Procedure Laterality Date  . ABDOMINAL HYSTERECTOMY    . arthroscopic knee Left 2-16  . CARPAL TUNNEL RELEASE Right 11/01/2014  . COLONOSCOPY    . JOINT REPLACEMENT    . TONSILLECTOMY    . TOTAL KNEE ARTHROPLASTY Right 02/18/2015   Procedure: RIGHT TOTAL KNEE ARTHROPLASTY;  Surgeon: Netta Cedars, MD;  Location: [place];  Service: Orthopedics;  Laterality: Right;  . TOTAL KNEE ARTHROPLASTY Left 08/19/2015   Procedure: LEFT TOTAL KNEE ARTHROPLASTY;  Surgeon: Netta Cedars, MD;  Location: [place];  Service: Orthopedics;  Laterality: Left;     Allergies  Allergies  Allergen Reactions  . Penicillins Hives    Has patient had a PCN reaction causing immediate rash, facial/tongue/throat swelling, SOB or lightheadedness with hypotension: Yes Has patient had a PCN reaction causing severe rash involving mucus membranes or skin necrosis: No Has patient had a PCN reaction that required hospitalization No Has patient had a PCN reaction occurring within the last 10 years: No If all of the above answers are "NO", then may proceed with Cephalosporin use.    History of Present Illness    Lynn [name] is a 61 yo female with PMH of   HTN, HL, NIDDM, OSA on Cpap and hx of tobacco use. Denies any family hx of CAD. Reports having had a stress test mayn years ago that was normal. She is very active, cares for herself. Noticed yesterday that she developed angina in the early morning when getting up. With associated dyspnea. Has radiation down into the left arm. Pain remains constant and she became concerned.   Presented to [facility] for evaluation. EKG there noted SR with TWI in lead III, trop 2>>2.38. She was placed on IV heparin and cardiology consulted. Electrolytes were stable. Hgb 9.5, but known to have thalassemia.   Home Medications    Prior to Admission medications   Medication Sig Start Date End Date Taking? Authorizing Provider  blood glucose meter kit and supplies KIT Use up to four times daily as directed.Dx: E11.9 06/20/16   [name], MD  Blood Glucose Monitoring Suppl (ACCU-CHEK AVIVA PLUS) W/DEVICE KIT  01/19/15   Historical Provider, MD  cephALEXin (KEFLEX) 500 MG capsule Take 1 capsule (500 mg total) by mouth 2 (two) times daily. 10/17/16   Okey Regal, PA-C  glipiZIDE (GLUCOTROL) 5 MG tablet Take 1 tablet (5 mg total) by mouth 2 (two) times daily before a meal. 06/04/16   Gay Filler Copland, MD  glucose blood test strip Use up to four times daily as directly. Dx E11.9  09/13/16   Gay Filler Copland, MD  Insulin Pen Needle (PEN NEEDLES) 31G X 5 MM MISC 1 Units by Does not apply route daily. For use with victoza pen 05/28/16   [name], MD  losartan-hydrochlorothiazide (HYZAAR) 100-12.5 MG tablet TAKE 1 TABLET BY MOUTH DAILY 05/14/16   Gay Filler Copland, MD  metFORMIN (GLUCOPHAGE) 1000 MG tablet TAKE 1 TABLET BY MOUTH TWICE A DAY WITH A MEAL 06/04/16   Gay Filler Copland, MD  Multiple Vitamin (MULTIVITAMIN WITH MINERALS) TABS tablet Take 1 tablet by mouth daily. Reported on 01/03/2016    Historical Provider, MD  Naproxen Sodium (ALEVE PO) Take by mouth as needed.    Historical Provider, MD  pravastatin (PRAVACHOL)  20 MG tablet TAKE 1 TABLET (20 MG TOTAL) BY MOUTH DAILY. 08/30/16   Gay Filler Copland, MD  VICTOZA 18 MG/3ML SOPN START WITH 0.6 MG INJECTED  DAILY FOR ONE WEEK, THEN INCREASE TO 1.2 MG DAILY 09/10/16   [name], MD    Family History    Family History  Problem Relation Age of Onset  . Cancer Mother   . Diabetes Mother   . Hypertension Mother   . Hyperlipidemia Mother   . Cancer Father   . Hyperlipidemia Father   . Mental illness Sister   . Diabetes Sister   . Diabetes Maternal Grandmother   . Diabetes Maternal Grandfather   . Colon cancer Neg Hx   . Esophageal cancer Neg Hx   . Stomach cancer Neg Hx   . Rectal cancer Neg Hx     Social History    Social History   Social History  . Marital status: Married    Spouse name: N/A  . Number of children: N/A  . Years of education: N/A   Occupational History  . Not on file.   Social History Main Topics  . Smoking status: Former Smoker    Packs/day: 0.25    Years: 44.00    Types: Cigarettes    Quit date: 02/17/2015  . Smokeless tobacco: Never Used  . Alcohol use 0.0 oz/week     Comment: socially  . Drug use: No  . Sexual activity: Not on file   Other Topics Concern  . Not on file   Social History Narrative  . No narrative on file     Review of Systems    General:  No chills, fever, night sweats or weight changes.  Cardiovascular: See HPI Dermatological: No rash, lesions/masses Respiratory: No cough, dyspnea Urologic: No hematuria, dysuria Abdominal:   No nausea, vomiting, diarrhea, bright red blood per rectum, melena, or hematemesis Neurologic:  No visual changes, wkns, changes in mental status. All other systems reviewed and are otherwise negative except as noted above.  Physical Exam    Blood pressure (!) 143/76, pulse 76, temperature 97.8 F (36.6 C), resp. rate 14, SpO2 99 %.  General: Pleasant older AAF, NAD Psych: Normal affect. Neuro: Alert and oriented X 3. Moves all extremities  spontaneously. HEENT: Normal  Neck: Supple without bruits or JVD. Lungs:  Resp regular and unlabored, CTA. Heart: RRR no s3, s4, or murmurs. Abdomen: Soft, non-tender, non-distended, BS + x 4.  Extremities: No clubbing, cyanosis or edema. DP/PT/Radials 2+ and equal bilaterally.  Labs    Troponin (Point of Care Test) No results for input(s): TROPIPOC in the last 72 hours.  Recent Labs  11/16/16 1228 11/16/16 1545  TROPONINI 2.08* 2.38*   Lab Results  Component Value Date   WBC  8.5 11/16/2016   HGB 9.5 (L) 11/16/2016   HCT 30.7 (L) 11/16/2016   MCV 60.1 (L) 11/16/2016   PLT 316 11/16/2016    Recent Labs Lab 11/16/16 1228  NA 136  K 3.8  CL 100*  CO2 27  BUN 17  CREATININE 1.15*  CALCIUM 9.7  PROT 8.0  BILITOT 0.4  ALKPHOS 77  ALT 25  AST 38  GLUCOSE 205*   Lab Results  Component Value Date   CHOL 201 (H) 01/03/2016   HDL 50 01/03/2016   LDLCALC 123 01/03/2016   TRIG 139 01/03/2016   No results found for: [facility]   Radiology Studies    Dg Chest 2 View  Result Date: 11/16/2016 CLINICAL DATA:  Chest pain, shortness of breath. EXAM: CHEST  2 VIEW COMPARISON:  Radiographs of September 29, 2013. FINDINGS: The heart size and mediastinal contours are within normal limits. Both lungs are clear. No pneumothorax or pleural effusion is noted. The visualized skeletal structures are unremarkable. IMPRESSION: No active cardiopulmonary disease. Electronically Signed   By: Marijo Conception, M.D.   On: 11/16/2016 12:01    ECG & Cardiac Imaging    EKG: SR with TWI in lead III  Assessment & Plan    1. NSTEMI: Reports onset of chest pain with radiation into the left arm and dyspnea yesterday morning that persisted throughout the day. Presented to [facility] and noted to have an elevated trop. Does have RF including HTN, HL, OSA, NIDDM and hx of tobacco use. Given findings will plan for cardiac catheterization on Monday.  -- Continue IV heparin -- add low dose BB, and changed statin  to lipitor '40mg'$ .  -- check echo  2. HTN: On cozaar, will add low dose metoprolol  3. HL: on statin, will check lipid panel. Changed statin to lipitor.   4. NIDDM: Check Hgb a1c. Start SSI  Signed, Reino Bellis, NP-C Pager [phone] 11/16/2016, 5:17 PM    I have seen, examined and evaluated the patient this PM along with Harlan Stains, NP-C.  After reviewing all the available data and chart, we discussed the patients laboratory, study & physical findings as well as symptoms in detail. I agree with her findings, examination as well as impression recommendations as per our discussion.    Very pleasant 61 year old woman (native of [place]) who presents with what amounts to be the combination of a couple months of progressively worsening exertional dyspnea with some chest discomfort both at rest and with exertion. She awoke this morning with sudden onset chest heaviness and pressure along with dyspnea and is ruled in for non-STEMI. Cardiac risk factors include diabetes, hypertension, hyperlipidemia and obesity (essentially metabolic syndrome)  Agree with him the need for cardiac catheterization on Monday. I told the importance of letting us know if there is any recurrent chest pain. If so we would probably start nitroglycerin. For now he she is chest pain-free after using nitroglycerin in the emergency room. She is on aspirin, statin and already on ARB. We are adding a beta blocker which could likely be titrated up over the course of the weekend.  She has OSA, need to order CPAP  Sliding scale insulin for now. We'll determine additional requirements     Glenetta Hew, M.D., M.S. Interventional Cardiologist   Pager # [phone] Phone # [phone] [address] [place], [place] [address]

## 2016-11-16 NOTE — ED Notes (Signed)
IV started at 1225, prior to NTG given

## 2016-11-16 NOTE — ED Provider Notes (Signed)
[place] DEPT [facility] Provider Note   CSN: [id number] Arrival date & time: 11/16/16  1124     History   Chief Complaint Chief Complaint  Patient presents with  . Shortness of Breath    HPI Lynn [name] is a 61 y.o. female.  Patient with onset of anterior chest pain yesterday morning at 10 been constant since that time. Currently 5 out of 10. That's worse yesterday it was 10 out of 10. Associated with shortness of breath and nausea but no vomiting. Pain yesterday radiated into the left arm. Patient has significant cardiac risk factors to include diabetes hyperlipidemia. Also has a long-standing history of anemia secondary to thalassemia.      Past Medical History:  Diagnosis Date  . Anemia    thalasemia  . Arthritis   . Blood dyscrasia    thalasemia  . Clotting disorder ([place])    blood clot in eye 2016  . Diabetes mellitus without complication ([place])   . Eye hemorrhage 4/16   resolved rt  . Hyperlipidemia   . Hypertension   . Shortness of breath dyspnea    exertion- none since stopped smoking  . Sleep apnea    use CPAP  . Ulcer [facility])     Patient Active Problem List   Diagnosis Date Noted  . Non-STEMI (non-ST elevated myocardial infarction) ([place]) 11/16/2016  . H/O total knee replacement 08/19/2015  . Vision, loss, sudden 03/07/2015  . S/P TKR (total knee replacement) using cement 02/18/2015  . Thalassemia 09/02/2013  . Hyperlipidemia LDL goal < 100 08/24/2012  . Diabetes mellitus ([place]) 07/22/2012  . Hypertension 07/22/2012    Past Surgical History:  Procedure Laterality Date  . ABDOMINAL HYSTERECTOMY    . arthroscopic knee Left 2-16  . CARPAL TUNNEL RELEASE Right 11/01/2014  . COLONOSCOPY    . JOINT REPLACEMENT    . TONSILLECTOMY    . TOTAL KNEE ARTHROPLASTY Right 02/18/2015   Procedure: RIGHT TOTAL KNEE ARTHROPLASTY;  Surgeon: Netta Cedars, MD;  Location: [place];  Service: Orthopedics;  Laterality: Right;  . TOTAL KNEE ARTHROPLASTY Left 08/19/2015   Procedure: LEFT TOTAL KNEE ARTHROPLASTY;  Surgeon: Netta Cedars, MD;  Location: [place];  Service: Orthopedics;  Laterality: Left;    OB History    No data available       Home Medications    Prior to Admission medications   Medication Sig Start Date End Date Taking? Authorizing Provider  blood glucose meter kit and supplies KIT Use up to four times daily as directed.Dx: E11.9 06/20/16   [name], MD  Blood Glucose Monitoring Suppl (ACCU-CHEK AVIVA PLUS) W/DEVICE KIT  01/19/15   Historical Provider, MD  cephALEXin (KEFLEX) 500 MG capsule Take 1 capsule (500 mg total) by mouth 2 (two) times daily. 10/17/16   Okey Regal, PA-C  glipiZIDE (GLUCOTROL) 5 MG tablet Take 1 tablet (5 mg total) by mouth 2 (two) times daily before a meal. 06/04/16   Gay Filler Copland, MD  glucose blood test strip Use up to four times daily as directly. Dx E11.9 09/13/16   [name], MD  Insulin Pen Needle (PEN NEEDLES) 31G X 5 MM MISC 1 Units by Does not apply route daily. For use with victoza pen 05/28/16   [name], MD  losartan-hydrochlorothiazide (HYZAAR) 100-12.5 MG tablet TAKE 1 TABLET BY MOUTH DAILY 05/14/16   Gay Filler Copland, MD  metFORMIN (GLUCOPHAGE) 1000 MG tablet TAKE 1 TABLET BY MOUTH TWICE A DAY WITH A MEAL 06/04/16  [name], MD  Multiple Vitamin (MULTIVITAMIN WITH MINERALS) TABS tablet Take 1 tablet by mouth daily. Reported on 01/03/2016    Historical Provider, MD  Naproxen Sodium (ALEVE PO) Take by mouth as needed.    Historical Provider, MD  pravastatin (PRAVACHOL) 20 MG tablet TAKE 1 TABLET (20 MG TOTAL) BY MOUTH DAILY. 08/30/16   Gay Filler Copland, MD  VICTOZA 18 MG/3ML SOPN START WITH 0.6 MG INJECTED [place] DAILY FOR ONE WEEK, THEN INCREASE TO 1.2 MG DAILY 09/10/16   [name], MD    Family History Family History  Problem Relation Age of Onset  . Cancer Mother   . Diabetes Mother   . Hypertension Mother   . Hyperlipidemia Mother   . Cancer Father   .  Hyperlipidemia Father   . Mental illness Sister   . Diabetes Sister   . Diabetes Maternal Grandmother   . Diabetes Maternal Grandfather   . Colon cancer Neg Hx   . Esophageal cancer Neg Hx   . Stomach cancer Neg Hx   . Rectal cancer Neg Hx     Social History Social History  Substance Use Topics  . Smoking status: Former Smoker    Packs/day: 0.25    Years: 44.00    Types: Cigarettes    Quit date: 02/17/2015  . Smokeless tobacco: Never Used  . Alcohol use 0.0 oz/week     Comment: socially     Allergies   Penicillins   Review of Systems Review of Systems  Constitutional: Negative for fever.  HENT: Negative for congestion.   Eyes: Negative for visual disturbance.  Respiratory: Positive for shortness of breath.   Cardiovascular: Positive for chest pain.  Gastrointestinal: Positive for nausea. Negative for abdominal pain and vomiting.  Genitourinary: Negative for dysuria.  Musculoskeletal: Negative for back pain.  Skin: Negative for rash.  Hematological: Does not bruise/bleed easily.  Psychiatric/Behavioral: Negative for confusion.     Physical Exam Updated Vital Signs BP 112/71   Pulse 78   Temp 98 F (36.7 C) (Oral)   Resp 19   SpO2 96%   Physical Exam  Constitutional: She is oriented to person, place, and time. She appears well-developed and well-nourished. No distress.  HENT:  Head: Normocephalic and atraumatic.  Eyes: Conjunctivae and EOM are normal. Pupils are equal, round, and reactive to light.  Neck: Normal range of motion. Neck supple.  Cardiovascular: Normal rate, regular rhythm and normal heart sounds.   Pulmonary/Chest: Effort normal and breath sounds normal. No respiratory distress.  Abdominal: Soft. Bowel sounds are normal. There is no tenderness.  Musculoskeletal: Normal range of motion.  Neurological: She is alert and oriented to person, place, and time. No cranial nerve deficit or sensory deficit. She exhibits normal muscle tone. Coordination  normal.  Skin: Skin is warm.  Nursing note and vitals reviewed.    ED Treatments / Results  Labs (all labs ordered are listed, but only abnormal results are displayed) Labs Reviewed  CBC - Abnormal; Notable for the following:       Result Value   Hemoglobin 9.5 (*)    HCT 30.7 (*)    MCV 60.1 (*)    MCH 18.6 (*)    RDW 16.3 (*)    All other components within normal limits  COMPREHENSIVE METABOLIC PANEL - Abnormal; Notable for the following:    Chloride 100 (*)    Glucose, Bld 205 (*)    Creatinine, Ser 1.15 (*)    GFR calc non  Af Amer 50 (*)    GFR calc Af Amer 58 (*)    All other components within normal limits  TROPONIN I - Abnormal; Notable for the following:    Troponin I 2.08 (*)    All other components within normal limits  HEPARIN LEVEL (UNFRACTIONATED)    EKG  EKG Interpretation  Date/Time:  Friday November 16 2016 11:40:30 EST Ventricular Rate:  75 PR Interval:    QRS Duration: 84 QT Interval:  406 QTC Calculation: 454 R Axis:   33 Text Interpretation:  Sinus rhythm Low voltage, precordial leads Baseline wander in lead(s) V2 No significant change since last tracing except for t wave changes Reconfirmed by Paola Aleshire  MD, Zayde Stroupe [phone]) on 11/16/2016 1:18:13 PM       Radiology Dg Chest 2 View  Result Date: 11/16/2016 CLINICAL DATA:  Chest pain, shortness of breath. EXAM: CHEST  2 VIEW COMPARISON:  Radiographs of September 29, 2013. FINDINGS: The heart size and mediastinal contours are within normal limits. Both lungs are clear. No pneumothorax or pleural effusion is noted. The visualized skeletal structures are unremarkable. IMPRESSION: No active cardiopulmonary disease. Electronically Signed   By: Marijo Conception, M.D.   On: 11/16/2016 12:01    Procedures Procedures (including critical care time)  CRITICAL CARE Performed by: Fredia Sorrow Total critical care time: 30 minutes Critical care time was exclusive of separately billable procedures and treating  other patients. Critical care was necessary to treat or prevent imminent or life-threatening deterioration. Critical care was time spent personally by me on the following activities: development of treatment plan with patient and/or surrogate as well as nursing, discussions with consultants, evaluation of patient's response to treatment, examination of patient, obtaining history from patient or surrogate, ordering and performing treatments and interventions, ordering and review of laboratory studies, ordering and review of radiographic studies, pulse oximetry and re-evaluation of patient's condition.  Medications Ordered in ED Medications  nitroGLYCERIN (NITROSTAT) SL tablet 0.4 mg (0.4 mg Sublingual Given 11/16/16 1241)  heparin injection 4,000 Units (not administered)  heparin ADULT infusion 100 units/mL (25000 units/243m sodium chloride 0.45%) (not administered)  aspirin chewable tablet 324 mg (324 mg Oral Given 11/16/16 1228)     Initial Impression / Assessment and Plan / ED Course  I have reviewed the triage vital signs and the nursing notes.  Pertinent labs & imaging results that were available during my care of the patient were reviewed by me and considered in my medical decision making (see chart for details).     Patient with onset of chest pain across the anterior part of her chest radiating to her left arm that started yesterday morning at 10 AM is been constant but has improved today resolved here with aspirin and nitroglycerin. Upon arrival here pain was 5 out of 10 yesterday at its peak it was a 10 out of 10. Associated with nausea and some shortness of breath.  EKG without any acute changes other than some T-wave inversions anteriorly compared to her old one. Chest x-ray negative labs significant for an elevated troponin at 2 suggestive of a non-STEMI. Patient remains chest pain-free but started on heparin bolus and drip based on the troponin. Discussed with cardiology Dr. NMeda Coffeeat  cone patient will be accepted to telemetry or step down whichever bed is available first. Patient will be made nothing by mouth in case she can get there before for this afternoon and then they will do the calf today. Otherwise the calf will be delayed.  Patient remains chest pain-free.  Final Clinical Impressions(s) / ED Diagnoses   Final diagnoses:  Non-STEMI (non-ST elevated myocardial infarction) ([place])  Chest pain, unspecified type    New Prescriptions New Prescriptions   No medications on file     Fredia Sorrow, MD 11/16/16 1354

## 2016-11-16 NOTE — H&P (Signed)
No note

## 2016-11-16 NOTE — Telephone Encounter (Signed)
Patient Name: MAYU ROCKHOLT DOB: [birth year] Initial Comment Caller states c/o shortness of breath with exertion. She was also experiencing chest pain radiating down right arm and blood sugar of 285 yesterday but denies those symptoms today. Nurse Assessment Nurse: Ronnald Ramp, RN, Miranda Date/Time (Eastern Time): 11/16/2016 8:16:22 AM Confirm and document reason for call. If symptomatic, describe symptoms. ---Caller states yesterday she had chest pain that radiated down her left arm. She has had SOB with minor exertion for months but getting worse. Denies any chest pain now or SOB at rest. BS was 285 yesterday. Does the patient have any new or worsening symptoms? ---Yes Will a triage be completed? ---Yes Related visit to physician within the last 2 weeks? ---No Does the PT have any chronic conditions? (i.e. diabetes, asthma, etc.) ---Yes List chronic conditions. ---Diabetes, HTN, High Cholesterol Is this a behavioral health or substance abuse call? ---No Guidelines Guideline Title Affirmed Question Affirmed Notes Chest Pain Chest pain lasts > 5 minutes (Exceptions: chest pain occurring > 3 days ago and now asymptomatic; same as previously diagnosed heartburn and has accompanying sour taste in mouth) Breathing Difficulty [1] MILD difficulty breathing (e.g., minimal/no SOB at rest, SOB with walking, pulse <100) AND [2] NEW-onset or WORSE than normal Final Disposition User See Physician within 4 Hours (or PCP triage) Ronnald Ramp, RN, Miranda Comments Appt scheduled for 11am with Mackie Pai, PA. Referrals REFERRED TO PCP OFFICE Disagree/Comply: Comply Disagree/Comply: Comply

## 2016-11-16 NOTE — ED Notes (Signed)
Report to Nira Conn, RN on 3W.

## 2016-11-16 NOTE — Progress Notes (Signed)
ANTICOAGULATION CONSULT NOTE - Follow Up Consult  Pharmacy Consult for Heparin  Indication: chest pain/ACS  Allergies  Allergen Reactions  . Penicillins Hives    Has patient had a PCN reaction causing immediate rash, facial/tongue/throat swelling, SOB or lightheadedness with hypotension: Yes Has patient had a PCN reaction causing severe rash involving mucus membranes or skin necrosis: No Has patient had a PCN reaction that required hospitalization No Has patient had a PCN reaction occurring within the last 10 years: No If all of the above answers are "NO", then may proceed with Cephalosporin use.   Vital Signs: Temp: 98.3 F (36.8 C) (02/02 2026) Temp Source: Oral (02/02 2026) BP: 138/84 (02/02 2026) Pulse Rate: 80 (02/02 2026)  Labs:  Recent Labs  11/16/16 1228 11/16/16 1545 11/16/16 1901 11/16/16 2143  HGB 9.5*  --   --   --   HCT 30.7*  --   --   --   PLT 316  --   --   --   HEPARINUNFRC  --   --   --  0.12*  CREATININE 1.15*  --   --   --   TROPONINI 2.08* 2.38* 2.03* 1.67*    CrCl cannot be calculated (Unknown ideal weight.).   Assessment: 61 y/o F on heparin for CP, likely cath Monday, no issues per RN.   Goal of Therapy:  Heparin level 0.3-0.7 units/ml Monitor platelets by anticoagulation protocol: Yes   Plan:  -Inc heparin to 1050 units/hr -0800 HL  Narda Bonds 11/16/2016,11:45 PM

## 2016-11-16 NOTE — Progress Notes (Signed)
ANTICOAGULATION CONSULT NOTE - Initial Consult  Pharmacy Consult for heparin Indication: chest pain/ACS  Allergies  Allergen Reactions  . Penicillins Hives    Has patient had a PCN reaction causing immediate rash, facial/tongue/throat swelling, SOB or lightheadedness with hypotension: Yes Has patient had a PCN reaction causing severe rash involving mucus membranes or skin necrosis: No Has patient had a PCN reaction that required hospitalization No Has patient had a PCN reaction occurring within the last 10 years: No If all of the above answers are "NO", then may proceed with Cephalosporin use.    Patient Measurements:   Heparin Dosing Weight: 72kg  Vital Signs: Temp: 98 F (36.7 C) (02/02 1135) Temp Source: Oral (02/02 1135) BP: 112/71 (02/02 1247) Pulse Rate: 78 (02/02 1247)  Labs:  Recent Labs  11/16/16 1228  HGB 9.5*  HCT 30.7*  PLT 316  CREATININE 1.15*  TROPONINI 2.08*    CrCl cannot be calculated (Unknown ideal weight.).   Medical History: Past Medical History:  Diagnosis Date  . Anemia    thalasemia  . Arthritis   . Blood dyscrasia    thalasemia  . Clotting disorder ([place])    blood clot in eye 2016  . Diabetes mellitus without complication ([place])   . Eye hemorrhage 4/16   resolved rt  . Hyperlipidemia   . Hypertension   . Shortness of breath dyspnea    exertion- none since stopped smoking  . Sleep apnea    use CPAP  . Ulcer ([place])     Assessment: 79 yoF presents with CP to start heparin for ACS rule out. No OAC noted PTA.   Goal of Therapy:  Heparin level 0.3-0.7 units/ml Monitor platelets by anticoagulation protocol: Yes   Plan:  -Heparin 4000 units x1 -Heparin 850 units/hr -6 hr heparin level -Daily CBC, heparin level, CBC  Arrie Senate, PharmD PGY-1 Pharmacy Resident Pager: [phone] 11/16/2016

## 2016-11-17 ENCOUNTER — Inpatient Hospital Stay (HOSPITAL_COMMUNITY): Payer: BLUE CROSS/BLUE SHIELD

## 2016-11-17 DIAGNOSIS — R079 Chest pain, unspecified: Secondary | ICD-10-CM

## 2016-11-17 LAB — LIPID PANEL
Cholesterol: 171 mg/dL (ref 0–200)
HDL: 40 mg/dL — ABNORMAL LOW (ref >40)
LDL Cholesterol: 99 mg/dL (ref 0–99)
Total CHOL/HDL Ratio: 4.3 RATIO
Triglycerides: 161 mg/dL — ABNORMAL HIGH (ref <150)
VLDL: 32 mg/dL (ref 0–40)

## 2016-11-17 LAB — CBC
HCT: 29.3 % — ABNORMAL LOW (ref 36.0–46.0)
Hemoglobin: 8.9 g/dL — ABNORMAL LOW (ref 12.0–15.0)
MCH: 18.5 pg — ABNORMAL LOW (ref 26.0–34.0)
MCHC: 30.4 g/dL (ref 30.0–36.0)
MCV: 61 fL — ABNORMAL LOW (ref 78.0–100.0)
Platelets: 277 10*3/uL (ref 150–400)
RBC: 4.8 MIL/uL (ref 3.87–5.11)
RDW: 15.9 % — ABNORMAL HIGH (ref 11.5–15.5)
WBC: 7.5 10*3/uL (ref 4.0–10.5)

## 2016-11-17 LAB — BASIC METABOLIC PANEL
Anion gap: 8 (ref 5–15)
BUN: 12 mg/dL (ref 6–20)
CO2: 26 mmol/L (ref 22–32)
Calcium: 9.5 mg/dL (ref 8.9–10.3)
Chloride: 102 mmol/L (ref 101–111)
Creatinine, Ser: 1.08 mg/dL — ABNORMAL HIGH (ref 0.44–1.00)
GFR calc Af Amer: >60 mL/min (ref >60)
GFR calc non Af Amer: 54 mL/min — ABNORMAL LOW (ref >60)
Glucose, Bld: 142 mg/dL — ABNORMAL HIGH (ref 65–99)
Potassium: 3.9 mmol/L (ref 3.5–5.1)
Sodium: 136 mmol/L (ref 135–145)

## 2016-11-17 LAB — GLUCOSE, CAPILLARY
Glucose-Capillary: 202 mg/dL — ABNORMAL HIGH (ref 65–99)
Glucose-Capillary: 121 mg/dL — ABNORMAL HIGH (ref 65–99)
Glucose-Capillary: 158 mg/dL — ABNORMAL HIGH (ref 65–99)
Glucose-Capillary: 176 mg/dL — ABNORMAL HIGH (ref 65–99)

## 2016-11-17 LAB — HEPARIN LEVEL (UNFRACTIONATED)
Heparin Unfractionated: 0.14 IU/mL — ABNORMAL LOW (ref 0.30–0.70)
Heparin Unfractionated: 0.34 IU/mL (ref 0.30–0.70)

## 2016-11-17 LAB — TROPONIN I: Troponin I: 1.43 ng/mL (ref <0.03)

## 2016-11-17 LAB — ECHOCARDIOGRAM COMPLETE

## 2016-11-17 NOTE — Progress Notes (Signed)
ANTICOAGULATION CONSULT NOTE - Follow Up Consult  Pharmacy Consult for Heparin  Indication: chest pain/ACS  Allergies  Allergen Reactions  . Penicillins Hives    Has patient had a PCN reaction causing immediate rash, facial/tongue/throat swelling, SOB or lightheadedness with hypotension: Yes Has patient had a PCN reaction causing severe rash involving mucus membranes or skin necrosis: No Has patient had a PCN reaction that required hospitalization No Has patient had a PCN reaction occurring within the last 10 years: No If all of the above answers are "NO", then may proceed with Cephalosporin use.   Vital Signs: BP: 127/64 (02/03 0903) Pulse Rate: 83 (02/03 0903)   Patient Measurements: Heparin Dosing Weight: 72kg  Labs:  Recent Labs  11/16/16 1228  11/16/16 1901 11/16/16 2143 11/17/16 0448 11/17/16 1002 11/17/16 1648  HGB 9.5*  --   --   --  8.9*  --   --   HCT 30.7*  --   --   --  29.3*  --   --   PLT 316  --   --   --  277  --   --   HEPARINUNFRC  --   --   --  0.12*  --  0.14* 0.34  CREATININE 1.15*  --   --   --  1.08*  --   --   TROPONINI 2.08*  < > 2.03* 1.67* 1.43*  --   --   < > = values in this interval not displayed.  CrCl cannot be calculated (Unknown ideal weight.).   Assessment: 61 y/o F on heparin for CP, likely cath Monday. Troponins trending down. Heparin level at goal after increase to 1300 units/hr,  Goal of Therapy:  Heparin level 0.3-0.7 units/ml Monitor platelets by anticoagulation protocol: Yes   Plan: No heparin changes needed Heparin level and CBC daily  Hildred Laser, Pharm D 11/17/2016 7:10 PM

## 2016-11-17 NOTE — Progress Notes (Signed)
Progress Note  Patient Name: Lynn [name] Date of Encounter: 11/17/2016  Primary Cardiologist: New to Dr. Ellyn Hack  Subjective   Intermittent chest pain lasting for few seconds. No dyspnea.   Inpatient Medications    Scheduled Meds: . aspirin EC  81 mg Oral Daily  . atorvastatin  40 mg Oral q1800  . losartan  100 mg Oral Q supper   And  . hydrochlorothiazide  12.5 mg Oral Q supper  . Influenza vac split quadrivalent PF  0.5 mL Intramuscular Tomorrow-1000  . insulin aspart  0-15 Units Subcutaneous TID WC  . insulin aspart  0-5 Units Subcutaneous QHS  . metoprolol tartrate  12.5 mg Oral BID   Continuous Infusions: . heparin 1,050 Units/hr (11/17/16 0300)   PRN Meds: acetaminophen, nitroGLYCERIN, ondansetron (ZOFRAN) IV   Vital Signs    Vitals:   11/16/16 1657 11/16/16 2026 11/17/16 0700 11/17/16 0903  BP: (!) 143/76 138/84 121/63 127/64  Pulse: 76 80 73 83  Resp: 14 16 16    Temp: 97.8 F (36.6 C) 98.3 F (36.8 C) 98.6 F (37 C)   TempSrc:  Oral Oral   SpO2: 99% 96% 98%     Intake/Output Summary (Last 24 hours) at 11/17/16 1039 Last data filed at 11/17/16 0700  Gross per 24 hour  Intake           598.06 ml  Output                0 ml  Net           598.06 ml   There were no vitals filed for this visit.  Telemetry    NSR with PACs ? SVT when rate goes to 120s - Personally Reviewed  ECG    N/A  Physical Exam   GEN: No acute distress.  Obese female Neck: No JVD Cardiac: RRR, no murmurs, rubs, or gallops.  Respiratory: Clear to auscultation bilaterally. GI: Soft, nontender, non-distended  MS: No edema; No deformity. Neuro:  Nonfocal  Psych: Normal affect   Labs    Chemistry Recent Labs Lab 11/16/16 1228 11/17/16 0448  NA 136 136  K 3.8 3.9  CL 100* 102  CO2 27 26  GLUCOSE 205* 142*  BUN 17 12  CREATININE 1.15* 1.08*  CALCIUM 9.7 9.5  PROT 8.0  --   ALBUMIN 4.1  --   AST 38  --   ALT 25  --   ALKPHOS 77  --   BILITOT 0.4  --     GFRNONAA 50* 54*  GFRAA 58* >60  ANIONGAP 9 8     Hematology Recent Labs Lab 11/16/16 1228 11/17/16 0448  WBC 8.5 7.5  RBC 5.11 4.80  HGB 9.5* 8.9*  HCT 30.7* 29.3*  MCV 60.1* 61.0*  MCH 18.6* 18.5*  MCHC 30.9 30.4  RDW 16.3* 15.9*  PLT 316 277    Cardiac Enzymes Recent Labs Lab 11/16/16 1545 11/16/16 1901 11/16/16 2143 11/17/16 0448  TROPONINI 2.38* 2.03* 1.67* 1.43*   No results for input(s): TROPIPOC in the last 168 hours.   BNPNo results for input(s): BNP, PROBNP in the last 168 hours.   DDimer No results for input(s): DDIMER in the last 168 hours.   Radiology    Dg Chest 2 View  Result Date: 11/16/2016 CLINICAL DATA:  Chest pain, shortness of breath. EXAM: CHEST  2 VIEW COMPARISON:  Radiographs of September 29, 2013. FINDINGS: The heart size and mediastinal contours are within normal limits. Both lungs  are clear. No pneumothorax or pleural effusion is noted. The visualized skeletal structures are unremarkable. IMPRESSION: No active cardiopulmonary disease. Electronically Signed   By: Marijo Conception, M.D.   On: 11/16/2016 12:01    Cardiac Studies   Pending echo and cath   Patient Profile     Very pleasant 61 year old woman (native of [place]) with hx of diabetes, hypertension, hyperlipidemia and obesity (essentially metabolic syndrome) presents with what amounts to be the combination of a couple months of progressively worsening exertional dyspnea with some chest discomfort both at rest and with exertion  Assessment & Plan    1. NSTEMI - Peak of troponin 2.38, now trending down. Continue ASA, statin and IV heparin. Intermittent sharp pain. Cath Monday.   2. HTN - BP stable on current medication.   3. HLD - 11/17/2016: Cholesterol 171; HDL 40; LDL Cholesterol 99; Triglycerides 161; VLDL 32  - Continue statin  4. DM - Pending A1c. On SSI  5. AKI - Scr improving  Signed, Bhagat,Bhavinkumar, PA  11/17/2016, 10:39 AM    I have seen and examined  the patient along with Bhagat,Bhavinkumar, PA.  I have reviewed the chart, notes and new data.  I agree with PA's note.  Key new complaints: No angina. Denies dyspnea at rest Key examination changes: No clinical signs of congestive heart failure, no significant arrhythmia Key new findings / data: improving creatinine, virtually in normal range now. ECG does not show localizing findings.  PLAN: Aspirin, heparin, statin, beta blockers over the weekend. Cardiac catheterization on Monday. Target LDL under 70. Start intravenous fluids Sunday night.  Sanda Klein, MD, [place] [phone] 11/17/2016, 11:56 AM

## 2016-11-17 NOTE — Progress Notes (Signed)
ANTICOAGULATION CONSULT NOTE - Follow Up Consult  Pharmacy Consult for Heparin  Indication: chest pain/ACS  Allergies  Allergen Reactions  . Penicillins Hives    Has patient had a PCN reaction causing immediate rash, facial/tongue/throat swelling, SOB or lightheadedness with hypotension: Yes Has patient had a PCN reaction causing severe rash involving mucus membranes or skin necrosis: No Has patient had a PCN reaction that required hospitalization No Has patient had a PCN reaction occurring within the last 10 years: No If all of the above answers are "NO", then may proceed with Cephalosporin use.   Vital Signs: Temp: 98.6 F (37 C) (02/03 0700) Temp Source: Oral (02/03 0700) BP: 127/64 (02/03 0903) Pulse Rate: 83 (02/03 0903)   Patient Measurements: Heparin Dosing Weight: 72kg  Labs:  Recent Labs  11/16/16 1228  11/16/16 1901 11/16/16 2143 11/17/16 0448 11/17/16 1002  HGB 9.5*  --   --   --  8.9*  --   HCT 30.7*  --   --   --  29.3*  --   PLT 316  --   --   --  277  --   HEPARINUNFRC  --   --   --  0.12*  --  0.14*  CREATININE 1.15*  --   --   --  1.08*  --   TROPONINI 2.08*  < > 2.03* 1.67* 1.43*  --   < > = values in this interval not displayed.  CrCl cannot be calculated (Unknown ideal weight.).   Assessment: 61 y/o F on heparin for CP, likely cath Monday. Troponins trending down. Heparin level subtherapeutic at 0.14 today and no issues reported with line per RN. Hgb low, but stable. Plt normal. No overt s/s bleeding noted.   Goal of Therapy:  Heparin level 0.3-0.7 units/ml Monitor platelets by anticoagulation protocol: Yes   Plan:  Increase heparin gtt to 1300 units/hr Heparin level in 6 hours  Heparin level and CBC daily Monitor for s/s bleeding  Argie Ramming, PharmD Pharmacy Resident  Pager [phone] 11/17/16 10:39 AM

## 2016-11-18 LAB — CBC
HCT: 31.6 % — ABNORMAL LOW (ref 36.0–46.0)
Hemoglobin: 9.3 g/dL — ABNORMAL LOW (ref 12.0–15.0)
MCH: 18 pg — ABNORMAL LOW (ref 26.0–34.0)
MCHC: 29.4 g/dL — ABNORMAL LOW (ref 30.0–36.0)
MCV: 61 fL — ABNORMAL LOW (ref 78.0–100.0)
Platelets: 307 10*3/uL (ref 150–400)
RBC: 5.18 MIL/uL — ABNORMAL HIGH (ref 3.87–5.11)
RDW: 15.5 % (ref 11.5–15.5)
WBC: 9.3 10*3/uL (ref 4.0–10.5)

## 2016-11-18 LAB — GLUCOSE, CAPILLARY
Glucose-Capillary: 192 mg/dL — ABNORMAL HIGH (ref 65–99)
Glucose-Capillary: 183 mg/dL — ABNORMAL HIGH (ref 65–99)
Glucose-Capillary: 140 mg/dL — ABNORMAL HIGH (ref 65–99)
Glucose-Capillary: 220 mg/dL — ABNORMAL HIGH (ref 65–99)

## 2016-11-18 LAB — HEMOGLOBIN A1C
Hgb A1c MFr Bld: 7.7 % — ABNORMAL HIGH (ref 4.8–5.6)
Mean Plasma Glucose: 174 mg/dL

## 2016-11-18 LAB — HEPARIN LEVEL (UNFRACTIONATED): Heparin Unfractionated: 0.48 IU/mL (ref 0.30–0.70)

## 2016-11-18 MED ORDER — ASPIRIN 81 MG PO CHEW: 81.0000 mg | CHEWABLE_TABLET | ORAL | Status: DC

## 2016-11-18 MED ORDER — SODIUM CHLORIDE 0.9% FLUSH: 3.0000 mL | INTRAVENOUS | Status: DC | PRN

## 2016-11-18 MED ORDER — SODIUM CHLORIDE 0.9 % WEIGHT BASED INFUSION: 1.0000 mL/kg/h | INTRAVENOUS | Status: DC

## 2016-11-18 MED ORDER — NITROGLYCERIN IN D5W 200-5 MCG/ML-% IV SOLN
0.0000 ug/min | INTRAVENOUS | Status: DC
  Administered 2016-11-18: 5 ug/min via INTRAVENOUS
  Filled 2016-11-18: qty 250

## 2016-11-18 MED ORDER — SODIUM CHLORIDE 0.9% FLUSH
3.0000 mL | Freq: Two times a day (BID) | INTRAVENOUS | Status: DC
  Administered 2016-11-18: 3 mL via INTRAVENOUS

## 2016-11-18 MED ORDER — SODIUM CHLORIDE 0.9 % IV SOLN: 250.0000 mL | INTRAVENOUS | Status: DC | PRN

## 2016-11-18 MED ORDER — ATORVASTATIN CALCIUM 80 MG PO TABS
80.0000 mg | ORAL_TABLET | Freq: Every day | ORAL | Status: DC
  Administered 2016-11-18 – 2016-11-19 (×2): 80 mg via ORAL
  Filled 2016-11-18 (×2): qty 1

## 2016-11-18 MED ORDER — ASPIRIN 81 MG PO CHEW
81.0000 mg | CHEWABLE_TABLET | ORAL | Status: AC
  Administered 2016-11-19: 81 mg via ORAL
  Filled 2016-11-18: qty 1

## 2016-11-18 MED ORDER — SODIUM CHLORIDE 0.9 % WEIGHT BASED INFUSION
3.0000 mL/kg/h | INTRAVENOUS | Status: DC
  Administered 2016-11-19: 3 mL/kg/h via INTRAVENOUS

## 2016-11-18 MED ORDER — SODIUM CHLORIDE 0.9 % WEIGHT BASED INFUSION: 3.0000 mL/kg/h | INTRAVENOUS | Status: DC

## 2016-11-18 NOTE — H&P (View-Only) (Signed)
Patient ID: Lynn [name], female   DOB: [birth year], 61 y.o.   MRN: [id number]   Progress Note  Patient Name: Lynn [name] Date of Encounter: 11/18/2016  Primary Cardiologist: New to Dr. Ellyn Hack  Subjective   Episode of mild chest pain last night, resolved spontaneously.   Inpatient Medications    Scheduled Meds: . aspirin EC  81 mg Oral Daily  . atorvastatin  80 mg Oral q1800  . insulin aspart  0-15 Units Subcutaneous TID WC  . insulin aspart  0-5 Units Subcutaneous QHS  . losartan  100 mg Oral Q supper  . metoprolol tartrate  12.5 mg Oral BID   Continuous Infusions: . heparin 1,300 Units/hr (11/18/16 0600)   PRN Meds: acetaminophen, nitroGLYCERIN, ondansetron (ZOFRAN) IV   Vital Signs    Vitals:   11/17/16 2100 11/17/16 2137 11/18/16 0543 11/18/16 0840  BP: 113/60 (!) 99/48 113/72 (!) 114/48  Pulse: 78 84 84 75  Resp: 18  20   Temp: 97.8 F (36.6 C)  98.6 F (37 C)   TempSrc:   Oral   SpO2: 99%  97%   Weight:   236 lb 1.8 oz (107.1 kg)     Intake/Output Summary (Last 24 hours) at 11/18/16 1224 Last data filed at 11/18/16 0600  Gross per 24 hour  Intake              803 ml  Output              100 ml  Net              703 ml   Filed Weights   11/18/16 0543  Weight: 236 lb 1.8 oz (107.1 kg)    Telemetry    NSR with PACs ? SVT when rate goes to 120s - Personally Reviewed  ECG    N/A  Physical Exam   GEN: No acute distress.  Obese female Neck: No JVD Cardiac: RRR, no murmurs, rubs, or gallops.  Respiratory: Clear to auscultation bilaterally. GI: Soft, nontender, non-distended  MS: No edema; No deformity. Neuro:  Nonfocal  Psych: Normal affect   Labs    Chemistry  Recent Labs Lab 11/16/16 1228 11/17/16 0448  NA 136 136  K 3.8 3.9  CL 100* 102  CO2 27 26  GLUCOSE 205* 142*  BUN 17 12  CREATININE 1.15* 1.08*  CALCIUM 9.7 9.5  PROT 8.0  --   ALBUMIN 4.1  --   AST 38  --   ALT 25  --   ALKPHOS 77  --   BILITOT 0.4  --     GFRNONAA 50* 54*  GFRAA 58* >60  ANIONGAP 9 8     Hematology  Recent Labs Lab 11/16/16 1228 11/17/16 0448 11/18/16 0233  WBC 8.5 7.5 9.3  RBC 5.11 4.80 5.18*  HGB 9.5* 8.9* 9.3*  HCT 30.7* 29.3* 31.6*  MCV 60.1* 61.0* 61.0*  MCH 18.6* 18.5* 18.0*  MCHC 30.9 30.4 29.4*  RDW 16.3* 15.9* 15.5  PLT 316 277 307    Cardiac Enzymes  Recent Labs Lab 11/16/16 1545 11/16/16 1901 11/16/16 2143 11/17/16 0448  TROPONINI 2.38* 2.03* 1.67* 1.43*   No results for input(s): TROPIPOC in the last 168 hours.   BNPNo results for input(s): BNP, PROBNP in the last 168 hours.   DDimer No results for input(s): DDIMER in the last 168 hours.   Radiology    No results found.  Cardiac Studies   Pending echo and cath  Patient Profile     Very pleasant 61 year old woman (native of [place]) with hx of diabetes, hypertension, hyperlipidemia and obesity (essentially metabolic syndrome) presents with what amounts to be the combination of a couple months of progressively worsening exertional dyspnea with some chest discomfort both at rest and with exertion  Assessment & Plan    1. NSTEMI: Peak of troponin 2.38. Continue ASA, statin, metoprolol and IV heparin. Intermittent sharp pain. Cath Monday. I discussed risks/benefits with her and she agrees to proceed.   2. HTN: BP stable on current medication.   3. HLD: Atorvastatin 80 daily.   4. DM: On SSI  5. AKI: Creatinine improved. Pre-hydrate before cath.   Signed, Loralie Champagne, MD  11/18/2016, 12:24 PM

## 2016-11-18 NOTE — Progress Notes (Signed)
Patient ID: Lynn [name], female   DOB: June 25, 1956, 61 y.o.   MRN: [id number]   Progress Note  Patient Name: Lynn [name] Date of Encounter: 11/18/2016  Primary Cardiologist: New to Dr. Ellyn Hack  Subjective   Episode of mild chest pain last night, resolved spontaneously.   Inpatient Medications    Scheduled Meds: . aspirin EC  81 mg Oral Daily  . atorvastatin  80 mg Oral q1800  . insulin aspart  0-15 Units Subcutaneous TID WC  . insulin aspart  0-5 Units Subcutaneous QHS  . losartan  100 mg Oral Q supper  . metoprolol tartrate  12.5 mg Oral BID   Continuous Infusions: . heparin 1,300 Units/hr (11/18/16 0600)   PRN Meds: acetaminophen, nitroGLYCERIN, ondansetron (ZOFRAN) IV   Vital Signs    Vitals:   11/17/16 2100 11/17/16 2137 11/18/16 0543 11/18/16 0840  BP: 113/60 (!) 99/48 113/72 (!) 114/48  Pulse: 78 84 84 75  Resp: 18  20   Temp: 97.8 F (36.6 C)  98.6 F (37 C)   TempSrc:   Oral   SpO2: 99%  97%   Weight:   236 lb 1.8 oz (107.1 kg)     Intake/Output Summary (Last 24 hours) at 11/18/16 1224 Last data filed at 11/18/16 0600  Gross per 24 hour  Intake              803 ml  Output              100 ml  Net              703 ml   Filed Weights   11/18/16 0543  Weight: 236 lb 1.8 oz (107.1 kg)    Telemetry    NSR with PACs ? SVT when rate goes to 120s - Personally Reviewed  ECG    N/A  Physical Exam   GEN: No acute distress.  Obese female Neck: No JVD Cardiac: RRR, no murmurs, rubs, or gallops.  Respiratory: Clear to auscultation bilaterally. GI: Soft, nontender, non-distended  MS: No edema; No deformity. Neuro:  Nonfocal  Psych: Normal affect   Labs    Chemistry  Recent Labs Lab 11/16/16 1228 11/17/16 0448  NA 136 136  K 3.8 3.9  CL 100* 102  CO2 27 26  GLUCOSE 205* 142*  BUN 17 12  CREATININE 1.15* 1.08*  CALCIUM 9.7 9.5  PROT 8.0  --   ALBUMIN 4.1  --   AST 38  --   ALT 25  --   ALKPHOS 77  --   BILITOT 0.4  --     GFRNONAA 50* 54*  GFRAA 58* >60  ANIONGAP 9 8     Hematology  Recent Labs Lab 11/16/16 1228 11/17/16 0448 11/18/16 0233  WBC 8.5 7.5 9.3  RBC 5.11 4.80 5.18*  HGB 9.5* 8.9* 9.3*  HCT 30.7* 29.3* 31.6*  MCV 60.1* 61.0* 61.0*  MCH 18.6* 18.5* 18.0*  MCHC 30.9 30.4 29.4*  RDW 16.3* 15.9* 15.5  PLT 316 277 307    Cardiac Enzymes  Recent Labs Lab 11/16/16 1545 11/16/16 1901 11/16/16 2143 11/17/16 0448  TROPONINI 2.38* 2.03* 1.67* 1.43*   No results for input(s): TROPIPOC in the last 168 hours.   BNPNo results for input(s): BNP, PROBNP in the last 168 hours.   DDimer No results for input(s): DDIMER in the last 168 hours.   Radiology    No results found.  Cardiac Studies   Pending echo and cath  Patient Profile     Very pleasant 61 year old woman (native of [place]) with hx of diabetes, hypertension, hyperlipidemia and obesity (essentially metabolic syndrome) presents with what amounts to be the combination of a couple months of progressively worsening exertional dyspnea with some chest discomfort both at rest and with exertion  Assessment & Plan    1. NSTEMI: Peak of troponin 2.38. Continue ASA, statin, metoprolol and IV heparin. Intermittent sharp pain. Cath Monday. I discussed risks/benefits with her and she agrees to proceed.   2. HTN: BP stable on current medication.   3. HLD: Atorvastatin 80 daily.   4. DM: On SSI  5. AKI: Creatinine improved. Pre-hydrate before cath.   Signed, Loralie Champagne, MD  11/18/2016, 12:24 PM

## 2016-11-18 NOTE — Progress Notes (Signed)
Report received in patient's room via Janett Billow RN using SBAR format, reviewed orders, labs, VS, tests, meds and general condition of patient, assumed care of patient.

## 2016-11-18 NOTE — Interval H&P Note (Signed)
Cath Lab Visit (complete for each Cath Lab visit)  Clinical Evaluation Leading to the Procedure:   ACS: Yes.    Non-ACS:    Anginal Classification: CCS IV  Anti-ischemic medical therapy: Maximal Therapy (2 or more classes of medications)  Non-Invasive Test Results: No non-invasive testing performed  Prior CABG: No previous CABG      History and Physical Interval Note:  11/18/2016 1:43 PM  Lynn [name]  has presented today for surgery, with the diagnosis of cp, elevated triponins  The various methods of treatment have been discussed with the patient and family. After consideration of risks, benefits and other options for treatment, the patient has consented to  Procedure(s): Left Heart Cath and Coronary Angiography (N/A) as a surgical intervention .  The patient's history has been reviewed, patient examined, no change in status, stable for surgery.  I have reviewed the patient's chart and labs.  Questions were answered to the patient's satisfaction.     [name]

## 2016-11-18 NOTE — Progress Notes (Signed)
ANTICOAGULATION CONSULT NOTE - Follow Up Consult  Pharmacy Consult for Heparin  Indication: chest pain/ACS  Allergies  Allergen Reactions  . Penicillins Hives    Has patient had a PCN reaction causing immediate rash, facial/tongue/throat swelling, SOB or lightheadedness with hypotension: Yes Has patient had a PCN reaction causing severe rash involving mucus membranes or skin necrosis: No Has patient had a PCN reaction that required hospitalization No Has patient had a PCN reaction occurring within the last 10 years: No If all of the above answers are "NO", then may proceed with Cephalosporin use.   Vital Signs: Temp: 98.6 F (37 C) (02/04 0543) Temp Source: Oral (02/04 0543) BP: 114/48 (02/04 0840) Pulse Rate: 75 (02/04 0840)   Patient Measurements: Heparin Dosing Weight: 72kg  Labs:  Recent Labs  11/16/16 1228  11/16/16 1901  11/16/16 2143 11/17/16 0448 11/17/16 1002 11/17/16 1648 11/18/16 0233  HGB 9.5*  --   --   --   --  8.9*  --   --  9.3*  HCT 30.7*  --   --   --   --  29.3*  --   --  31.6*  PLT 316  --   --   --   --  277  --   --  307  HEPARINUNFRC  --   --   --   < > 0.12*  --  0.14* 0.34 0.48  CREATININE 1.15*  --   --   --   --  1.08*  --   --   --   TROPONINI 2.08*  < > 2.03*  --  1.67* 1.43*  --   --   --   < > = values in this interval not displayed.  Estimated Creatinine Clearance: 59.8 mL/min (by C-G formula based on SCr of 1.08 mg/dL (H)).   Assessment: 61 y/o F on heparin for CP, likely cath Monday. Troponins trending down. Heparin level at goal on 1300 units/hr. CBC stable. No s/s bleeding.   Goal of Therapy:  Heparin level 0.3-0.7 units/ml Monitor platelets by anticoagulation protocol: Yes   Plan: Continue heparin gtt at 1300 units/hr  Heparin level and CBC daily Monitor for s/s bleeding Follow cath plan   Argie Ramming, PharmD Pharmacy Resident  Pager [phone] 11/18/16 8:50 AM

## 2016-11-19 ENCOUNTER — Ambulatory Visit: Payer: BLUE CROSS/BLUE SHIELD | Admitting: Family Medicine

## 2016-11-19 ENCOUNTER — Encounter: Payer: Self-pay | Admitting: Family Medicine

## 2016-11-19 ENCOUNTER — Encounter (HOSPITAL_COMMUNITY): Payer: Self-pay | Admitting: Interventional Cardiology

## 2016-11-19 ENCOUNTER — Encounter (HOSPITAL_COMMUNITY)
Admission: EM | Disposition: A | Discharge status: Home / Self Care | Payer: Self-pay | Source: Home / Self Care | Attending: Cardiology

## 2016-11-19 DIAGNOSIS — I4719 Other supraventricular tachycardia: Secondary | ICD-10-CM

## 2016-11-19 DIAGNOSIS — I471 Supraventricular tachycardia: Secondary | ICD-10-CM

## 2016-11-19 DIAGNOSIS — I2510 Atherosclerotic heart disease of native coronary artery without angina pectoris: Secondary | ICD-10-CM

## 2016-11-19 HISTORY — PX: CARDIAC CATHETERIZATION: SHX172

## 2016-11-19 LAB — CBC
HCT: 29 % — ABNORMAL LOW (ref 36.0–46.0)
Hemoglobin: 8.6 g/dL — ABNORMAL LOW (ref 12.0–15.0)
MCH: 18 pg — ABNORMAL LOW (ref 26.0–34.0)
MCHC: 29.7 g/dL — ABNORMAL LOW (ref 30.0–36.0)
MCV: 60.8 fL — ABNORMAL LOW (ref 78.0–100.0)
Platelets: 269 10*3/uL (ref 150–400)
RBC: 4.77 MIL/uL (ref 3.87–5.11)
RDW: 15.6 % — ABNORMAL HIGH (ref 11.5–15.5)
WBC: 8.3 10*3/uL (ref 4.0–10.5)

## 2016-11-19 LAB — BASIC METABOLIC PANEL WITH GFR
Anion gap: 6 (ref 5–15)
BUN: 13 mg/dL (ref 6–20)
CO2: 26 mmol/L (ref 22–32)
Calcium: 9.1 mg/dL (ref 8.9–10.3)
Chloride: 105 mmol/L (ref 101–111)
Creatinine, Ser: 1.01 mg/dL — ABNORMAL HIGH (ref 0.44–1.00)
GFR calc Af Amer: >60 mL/min
GFR calc non Af Amer: 58 mL/min — ABNORMAL LOW
Glucose, Bld: 138 mg/dL — ABNORMAL HIGH (ref 65–99)
Potassium: 4 mmol/L (ref 3.5–5.1)
Sodium: 137 mmol/L (ref 135–145)

## 2016-11-19 LAB — PROTIME-INR
INR: 0.94
Prothrombin Time: 12.5 seconds (ref 11.4–15.2)

## 2016-11-19 LAB — GLUCOSE, CAPILLARY
Glucose-Capillary: 152 mg/dL — ABNORMAL HIGH (ref 65–99)
Glucose-Capillary: 246 mg/dL — ABNORMAL HIGH (ref 65–99)
Glucose-Capillary: 131 mg/dL — ABNORMAL HIGH (ref 65–99)
Glucose-Capillary: 188 mg/dL — ABNORMAL HIGH (ref 65–99)

## 2016-11-19 LAB — HEPARIN LEVEL (UNFRACTIONATED): Heparin Unfractionated: 0.43 [IU]/mL (ref 0.30–0.70)

## 2016-11-19 SURGERY — LEFT HEART CATH AND CORONARY ANGIOGRAPHY
Anesthesia: LOCAL

## 2016-11-19 MED ORDER — MIDAZOLAM HCL 2 MG/2ML IJ SOLN
INTRAMUSCULAR | Status: AC
  Filled 2016-11-19: qty 2

## 2016-11-19 MED ORDER — HEPARIN SODIUM (PORCINE) 5000 UNIT/ML IJ SOLN
5000.0000 [IU] | Freq: Three times a day (TID) | INTRAMUSCULAR | Status: DC
  Administered 2016-11-20: 5000 [IU] via SUBCUTANEOUS
  Filled 2016-11-19 (×2): qty 1

## 2016-11-19 MED ORDER — SODIUM CHLORIDE 0.9 % IV SOLN: 250.0000 mL | INTRAVENOUS | Status: DC | PRN

## 2016-11-19 MED ORDER — HEPARIN SODIUM (PORCINE) 1000 UNIT/ML IJ SOLN
INTRAMUSCULAR | Status: AC
  Filled 2016-11-19: qty 1

## 2016-11-19 MED ORDER — VERAPAMIL HCL 2.5 MG/ML IV SOLN
INTRAVENOUS | Status: DC | PRN
  Administered 2016-11-19: 10 mL via INTRA_ARTERIAL

## 2016-11-19 MED ORDER — LIDOCAINE HCL (PF) 1 % IJ SOLN
INTRAMUSCULAR | Status: AC
  Filled 2016-11-19: qty 30

## 2016-11-19 MED ORDER — ASPIRIN 81 MG PO CHEW
81.0000 mg | CHEWABLE_TABLET | Freq: Every day | ORAL | Status: DC
  Administered 2016-11-19 – 2016-11-20 (×2): 81 mg via ORAL
  Filled 2016-11-19 (×2): qty 1

## 2016-11-19 MED ORDER — VERAPAMIL HCL 2.5 MG/ML IV SOLN
INTRAVENOUS | Status: AC
  Filled 2016-11-19: qty 2

## 2016-11-19 MED ORDER — SODIUM CHLORIDE 0.9 % IV SOLN
INTRAVENOUS | Status: AC
  Administered 2016-11-19: 10:00:00 via INTRAVENOUS

## 2016-11-19 MED ORDER — LIDOCAINE HCL (PF) 1 % IJ SOLN
INTRAMUSCULAR | Status: DC | PRN
  Administered 2016-11-19: 2 mL

## 2016-11-19 MED ORDER — IOPAMIDOL (ISOVUE-370) INJECTION 76%
INTRAVENOUS | Status: AC
  Filled 2016-11-19: qty 100

## 2016-11-19 MED ORDER — HEPARIN (PORCINE) IN NACL 2-0.9 UNIT/ML-% IJ SOLN
INTRAMUSCULAR | Status: AC
  Filled 2016-11-19: qty 1000

## 2016-11-19 MED ORDER — HEPARIN (PORCINE) IN NACL 2-0.9 UNIT/ML-% IJ SOLN
INTRAMUSCULAR | Status: DC | PRN
  Administered 2016-11-19: 08:00:00

## 2016-11-19 MED ORDER — SODIUM CHLORIDE 0.9% FLUSH
3.0000 mL | Freq: Two times a day (BID) | INTRAVENOUS | Status: DC
  Administered 2016-11-19 – 2016-11-20 (×3): 3 mL via INTRAVENOUS

## 2016-11-19 MED ORDER — MIDAZOLAM HCL 2 MG/2ML IJ SOLN
INTRAMUSCULAR | Status: DC | PRN
  Administered 2016-11-19 (×2): 1 mg via INTRAVENOUS

## 2016-11-19 MED ORDER — ACETAMINOPHEN 325 MG PO TABS: 650.0000 mg | ORAL_TABLET | ORAL | Status: DC | PRN

## 2016-11-19 MED ORDER — HEPARIN SODIUM (PORCINE) 1000 UNIT/ML IJ SOLN
INTRAMUSCULAR | Status: DC | PRN
  Administered 2016-11-19: 5000 [IU] via INTRAVENOUS

## 2016-11-19 MED ORDER — ONDANSETRON HCL 4 MG/2ML IJ SOLN: 4.0000 mg | Freq: Four times a day (QID) | INTRAMUSCULAR | Status: DC | PRN

## 2016-11-19 MED ORDER — FENTANYL CITRATE (PF) 100 MCG/2ML IJ SOLN
INTRAMUSCULAR | Status: DC | PRN
  Administered 2016-11-19: 50 ug via INTRAVENOUS

## 2016-11-19 MED ORDER — FENTANYL CITRATE (PF) 100 MCG/2ML IJ SOLN
INTRAMUSCULAR | Status: AC
  Filled 2016-11-19: qty 2

## 2016-11-19 MED ORDER — METOPROLOL TARTRATE 25 MG PO TABS
25.0000 mg | ORAL_TABLET | Freq: Two times a day (BID) | ORAL | Status: DC
  Administered 2016-11-19 – 2016-11-20 (×2): 25 mg via ORAL
  Filled 2016-11-19 (×3): qty 1

## 2016-11-19 MED ORDER — OXYCODONE-ACETAMINOPHEN 5-325 MG PO TABS: 1.0000 | ORAL_TABLET | ORAL | Status: DC | PRN

## 2016-11-19 MED ORDER — SODIUM CHLORIDE 0.9% FLUSH: 3.0000 mL | INTRAVENOUS | Status: DC | PRN

## 2016-11-19 SURGICAL SUPPLY — 11 items
CATH INFINITI 5 FR JL3.5 (CATHETERS) ×1 IMPLANT
CATH INFINITI JR4 5F (CATHETERS) ×1 IMPLANT
COVER PRB 48X5XTLSCP FOLD TPE (BAG) IMPLANT
COVER PROBE 5X48 (BAG) ×2
GLIDESHEATH SLEND A-KIT 6F 22G (SHEATH) ×1 IMPLANT
GUIDEWIRE INQWIRE 1.5J.035X260 (WIRE) IMPLANT
INQWIRE 1.5J .035X260CM (WIRE) ×2
KIT HEART LEFT (KITS) ×2 IMPLANT
PACK CARDIAC CATHETERIZATION (CUSTOM PROCEDURE TRAY) ×2 IMPLANT
TRANSDUCER W/STOPCOCK (MISCELLANEOUS) ×2 IMPLANT
TUBING CIL FLEX 10 FLL-RA (TUBING) ×2 IMPLANT

## 2016-11-19 NOTE — Progress Notes (Signed)
Progress Note  Patient Name: Lynn [name] Date of Encounter: 11/19/2016  Primary Cardiologist: Dr. Ellyn Hack  Subjective   Feeling well this morning. Went for [facility] with nonobstructive CAD noted.   Inpatient Medications    Scheduled Meds: . aspirin  81 mg Oral Daily  . atorvastatin  80 mg Oral q1800  . heparin  5,000 Units Subcutaneous Q8H  . insulin aspart  0-15 Units Subcutaneous TID WC  . insulin aspart  0-5 Units Subcutaneous QHS  . losartan  100 mg Oral Q supper  . metoprolol tartrate  12.5 mg Oral BID  . sodium chloride flush  3 mL Intravenous Q12H   Continuous Infusions: . sodium chloride     PRN Meds: sodium chloride, acetaminophen, nitroGLYCERIN, ondansetron (ZOFRAN) IV, oxyCODONE-acetaminophen, sodium chloride flush   Vital Signs    Vitals:   11/19/16 0812 11/19/16 0817 11/19/16 0822 11/19/16 0827  BP: (!) 105/51 (!) 103/53 (!) 116/59 111/61  Pulse: 72 66 71 67  Resp: 18 14 14 14   Temp:      TempSrc:      SpO2: 95% 95% 95% 95%  Weight:        Intake/Output Summary (Last 24 hours) at 11/19/16 1103 Last data filed at 11/19/16 0600  Gross per 24 hour  Intake           1078.3 ml  Output              400 ml  Net            678.3 ml   Filed Weights   11/18/16 0543 11/19/16 0545  Weight: 236 lb 1.8 oz (107.1 kg) 237 lb 1.6 oz (107.5 kg)    Telemetry    SR with PACs - Personally Reviewed  ECG    SR - Personally Reviewed  Physical Exam   GEN: Pleasant AAF, No acute distress.   Neck: No JVD Cardiac: RRR, no murmurs, rubs, or gallops.  Respiratory: Clear to auscultation bilaterally. GI: Soft, nontender, non-distended  MS: No edema; No deformity. TR band in place Neuro:  Nonfocal  Psych: Normal affect   Labs    Chemistry Recent Labs Lab 11/16/16 1228 11/17/16 0448 11/19/16 0419  NA 136 136 137  K 3.8 3.9 4.0  CL 100* 102 105  CO2 27 26 26   GLUCOSE 205* 142* 138*  BUN 17 12 13   CREATININE 1.15* 1.08* 1.01*  CALCIUM 9.7 9.5 9.1    PROT 8.0  --   --   ALBUMIN 4.1  --   --   AST 38  --   --   ALT 25  --   --   ALKPHOS 77  --   --   BILITOT 0.4  --   --   GFRNONAA 50* 54* 58*  GFRAA 58* >60 >60  ANIONGAP 9 8 6      Hematology Recent Labs Lab 11/17/16 0448 11/18/16 0233 11/19/16 0419  WBC 7.5 9.3 8.3  RBC 4.80 5.18* 4.77  HGB 8.9* 9.3* 8.6*  HCT 29.3* 31.6* 29.0*  MCV 61.0* 61.0* 60.8*  MCH 18.5* 18.0* 18.0*  MCHC 30.4 29.4* 29.7*  RDW 15.9* 15.5 15.6*  PLT 277 307 269    Cardiac Enzymes Recent Labs Lab 11/16/16 1545 11/16/16 1901 11/16/16 2143 11/17/16 0448  TROPONINI 2.38* 2.03* 1.67* 1.43*   No results for input(s): TROPIPOC in the last 168 hours.   BNPNo results for input(s): BNP, PROBNP in the last 168 hours.   DDimer No results  for input(s): DDIMER in the last 168 hours.   Lipid Panel     Component Value Date/Time   CHOL 171 11/17/2016 0448   TRIG 161 (H) 11/17/2016 0448   HDL 40 (L) 11/17/2016 0448   CHOLHDL 4.3 11/17/2016 0448   VLDL 32 11/17/2016 0448   LDLCALC 99 11/17/2016 0448   LDLDIRECT 131 (H) 09/02/2013 1004    Radiology    No results found.  Cardiac Studies   LHC: 11/19/16  Conclusion    Moderate to heavy coronary calcification.   Segmental 40-50% LAD narrowing.  30-50% proximal to mid RCA.  Coronaries are widely patent without high-grade stenosis.  Fluoroscopy demonstration of inferolateral calcified mass, likely representing mitral annular calcification. Consider CT or MRI if there is not heavy mitral valve calcification on echocardiography.  Spontaneous runs of supraventricular tachycardia during the procedure.   RECOMMENDATIONS:   No significant obstructive coronary disease is identified.   Etiology of enzyme elevation and clinical presentation could potentially be supraventricular arrhythmia superimposed on the cardiac substrate noted above.   TTE: 11/17/16  Study Conclusions  - Left ventricle: The cavity size was normal. Wall thickness  was   normal. Systolic function was normal. The estimated ejection   fraction was in the range of 55% to 60%. Doppler parameters are   consistent with abnormal left ventricular relaxation (grade 1   diastolic dysfunction). - Mitral valve: Severely calcified annulus. - Left atrium: The atrium was mildly dilated  Patient Profile     61 y.o. female PMH of HTN, HL, NIDDM, OSA on Cpap who presented to [facility] with exertional chest pain and dyspnea. Noted to have elevated trop, underwent LHC.   Assessment & Plan    1. NSTEMI: Underwent LHC this morning noting nonobstructive CAD with concern for calcified mass, but echo noted normal EF with severe mitral valve annulus. Had some SVT during procedure.  Peak of troponin 2.38. Continue ASA, statin, metoprolol (will increase today).   2. HTN: BP stable on current medication.   3. HLD: Atorvastatin 80 daily.   4. DM: On SSI  5. AKI: Creatinine stable.   6. Anemia: Known hx of thalassemia.   Signed, Reino Bellis, NP  11/19/2016, 11:03 AM     Patient seen and examined. Agree with assessment and plan. I reviewed cath findings with patient and husband.  Coronary calcification with 40 - 50% LAD and 30 - 50 % RCA stenosis.  She had transient SVT in the lab.  Now in sinus at 75.  Will increase metoprolol to 25 mg bid today and if recuurent SVT may need further titration.  LDL is 99; now on atorvastatin 80 mg.  Severe mirtral annular calcification.. Plan probable DC tomorrow.   Troy Sine, MD, [facility] 11/19/2016 11:25 AM

## 2016-11-19 NOTE — Interval H&P Note (Signed)
Cath Lab Visit (complete for each Cath Lab visit)  Clinical Evaluation Leading to the Procedure:   ACS: Yes.    Non-ACS:    Anginal Classification: CCS [name]  Anti-ischemic medical therapy: Maximal Therapy (2 or more classes of medications)  Non-Invasive Test Results: No non-invasive testing performed  Prior CABG: No previous CABG      History and Physical Interval Note:  11/19/2016 7:46 AM  Lynn [name]  has presented today for surgery, with the diagnosis of cp, elevated triponins  The various methods of treatment have been discussed with the patient and family. After consideration of risks, benefits and other options for treatment, the patient has consented to  Procedure(s): Left Heart Cath and Coronary Angiography (N/A) as a surgical intervention .  The patient's history has been reviewed, patient examined, no change in status, stable for surgery.  I have reviewed the patient's chart and labs.  Questions were answered to the patient's satisfaction.     Lynn [name]

## 2016-11-19 NOTE — Progress Notes (Signed)
NTG stopped D/T low B/P and patient is chest pain free, will continue to monitor. Patient is for the cath lab this am and remains NPO with no other changes.

## 2016-11-19 NOTE — Progress Notes (Signed)
Dr Wynonia Lawman paged and informed of Mrs Ueda's chest pain ressure to mid chest rated 5/10 with some SOB, got EKG and gave 1 NTG S/L and placed on 2L O2 via N/C for comfort, will start a NTG drip and titrate for chest pain, VSS with B/P a little soft.

## 2016-11-20 LAB — CBC
HCT: 23.5 % — ABNORMAL LOW (ref 36.0–46.0)
Hemoglobin: 8.7 g/dL — ABNORMAL LOW (ref 12.0–15.0)
MCH: 22.8 pg — ABNORMAL LOW (ref 26.0–34.0)
MCHC: 37 g/dL — ABNORMAL HIGH (ref 30.0–36.0)
MCV: 61.7 fL — ABNORMAL LOW (ref 78.0–100.0)
Platelets: 235 10*3/uL (ref 150–400)
RBC: 3.81 MIL/uL — ABNORMAL LOW (ref 3.87–5.11)
RDW: 16.2 % — ABNORMAL HIGH (ref 11.5–15.5)
WBC: 8.4 10*3/uL (ref 4.0–10.5)

## 2016-11-20 LAB — BASIC METABOLIC PANEL
Anion gap: 9 (ref 5–15)
BUN: 12 mg/dL (ref 6–20)
CO2: 23 mmol/L (ref 22–32)
Calcium: 9.1 mg/dL (ref 8.9–10.3)
Chloride: 103 mmol/L (ref 101–111)
Creatinine, Ser: 0.97 mg/dL (ref 0.44–1.00)
GFR calc Af Amer: >60 mL/min (ref >60)
GFR calc non Af Amer: >60 mL/min (ref >60)
Glucose, Bld: 155 mg/dL — ABNORMAL HIGH (ref 65–99)
Potassium: 4.2 mmol/L (ref 3.5–5.1)
Sodium: 135 mmol/L (ref 135–145)

## 2016-11-20 LAB — GLUCOSE, CAPILLARY
Glucose-Capillary: 147 mg/dL — ABNORMAL HIGH (ref 65–99)
Glucose-Capillary: 193 mg/dL — ABNORMAL HIGH (ref 65–99)
Glucose-Capillary: 227 mg/dL — ABNORMAL HIGH (ref 65–99)

## 2016-11-20 MED ORDER — ATORVASTATIN CALCIUM 80 MG PO TABS
80.0000 mg | ORAL_TABLET | Freq: Every day | ORAL | 6 refills | Status: DC
Start: 2016-11-20 — End: 2017-06-12

## 2016-11-20 MED ORDER — METOPROLOL TARTRATE 25 MG PO TABS: 25.0000 mg | ORAL_TABLET | Freq: Two times a day (BID) | ORAL | 6 refills | Status: DC

## 2016-11-20 MED ORDER — NITROGLYCERIN 0.4 MG SL SUBL: 0.4000 mg | SUBLINGUAL_TABLET | SUBLINGUAL | 3 refills | Status: DC | PRN

## 2016-11-20 MED ORDER — ASPIRIN 81 MG PO CHEW: 81.0000 mg | CHEWABLE_TABLET | Freq: Every day | ORAL | Status: DC

## 2016-11-20 NOTE — Plan of Care (Signed)
Problem: Safety: Goal: Ability to remain free from injury will improve Outcome: Completed/Met Date Met: 11/20/16 Patient was instructed on use of call light and to call for assistance upon admission to the unit and has followed the instructions throughout her stay appropriately.

## 2016-11-20 NOTE — Plan of Care (Signed)
Problem: Education: Goal: Knowledge of [facility] Education information/materials will improve Outcome: Completed/Met Date Met: 11/20/16 Reviewed all meds, procedures and after care preparing patient for D/C, all questions answered and patient verbalized understanding.

## 2016-11-20 NOTE — Progress Notes (Signed)
Updated report received in patient's room via [facility], reviewed results of heart cath, new orders, right radial incision site, VS and all events of the day, assumed care of the patient.

## 2016-11-20 NOTE — Discharge Summary (Addendum)
Discharge Summary    Patient ID: Lynn [name],  MRN: [id number], DOB/AGE: Feb 01, 1956 61 y.o.  Admit date: 11/16/2016 Discharge date: 11/20/2016  Primary Care Provider: COPLAND,JESSICA Primary Cardiologist: Dr. Ellyn Hack  Discharge Diagnoses    Principal Problem:   Non-STEMI (non-ST elevated myocardial infarction) [facility]) Active Problems:   Chest pain   CAD in native artery   SVT (supraventricular tachycardia) (HCC)   Allergies Allergies  Allergen Reactions  . Penicillins Hives    Has patient had a PCN reaction causing immediate rash, facial/tongue/throat swelling, SOB or lightheadedness with hypotension: Yes Has patient had a PCN reaction causing severe rash involving mucus membranes or skin necrosis: No Has patient had a PCN reaction that required hospitalization No Has patient had a PCN reaction occurring within the last 10 years: No If all of the above answers are "NO", then may proceed with Cephalosporin use.    Diagnostic Studies/Procedures    LHC: 11/19/16    Moderate to heavy coronary calcification.   Segmental 40-50% LAD narrowing.  30-50% proximal to mid RCA.  Coronaries are widely patent without high-grade stenosis.  Fluoroscopy demonstration of inferolateral calcified mass, likely representing mitral annular calcification. Consider CT or MRI if there is not heavy mitral valve calcification on echocardiography.  Spontaneous runs of supraventricular tachycardia during the procedure.   RECOMMENDATIONS:   No significant obstructive coronary disease is identified.   Etiology of enzyme elevation and clinical presentation could potentially be supraventricular arrhythmia superimposed on the cardiac substrate noted above.   TTE: 11/17/16  Study Conclusions  - Left ventricle: The cavity size was normal. Wall thickness was   normal. Systolic function was normal. The estimated ejection   fraction was in the range of 55% to 60%. Doppler parameters are  consistent with abnormal left ventricular relaxation (grade 1   diastolic dysfunction). - Mitral valve: Severely calcified annulus. - Left atrium: The atrium was mildly dilated. _____________   History of Present Illness     Lynn [name] is a 61 yo female with PMH of  HTN, HL, NIDDM, OSA on Cpap and hx of tobacco use. Denies any family hx of CAD. Reports having had a stress test mayn years ago that was normal. She is very active, cares for herself. Noticed the day prior to admission that she developed angina in the early morning when getting up. With associated dyspnea. Has radiation down into the left arm. Pain remains constant and she became concerned. States she has been having intermittent episodes of this dyspnea that seem to be coming more often.   Presented to [facility] for evaluation. EKG there noted SR with TWI in lead III, trop 2>>2.38. She was placed on IV heparin and cardiology consulted. Electrolytes were stable. Hgb 9.5, but known to have thalassemia. She was transferred from [facility] to [facility] with plans for cardiac catheterization.   Hospital Course     Consultants: none  She was admitted and started on low dose BB, along with high dose statin. She had no further episodes of chest pain. Trop peaked at 2.38. She underwent cath on 11/19/16 with Dr. Tamala Julian showing nonobstructive CAD in the LAD and RCA with concern for a inferiolateral calcified mass. Follow up echo showed severely calcified mitral annulus, and no mass. She did have episode of SVT during cath procedure that was brief. As a result her BB was uptitrated. Still had some brief episodes of ST, but overall improvement in rate. No Afib, or Aflutter noted.   She was seen and  assessed by Dr. Claiborne Billings on 11/20/16 and determined stable for discharge home. I have arranged for hospital follow up with APP in the office.  _____________  Discharge Vitals Blood pressure 92/63, pulse 80, temperature (!) 96.8 F (36 C), resp. rate 16, weight 237 lb  1.6 oz (107.5 kg), SpO2 97 %.  Filed Weights   11/18/16 0543 11/19/16 0545  Weight: 236 lb 1.8 oz (107.1 kg) 237 lb 1.6 oz (107.5 kg)    Labs & Radiologic Studies    CBC  Recent Labs  11/19/16 0419 11/20/16 0516  WBC 8.3 8.4  HGB 8.6* 8.7*  HCT 29.0* 23.5*  MCV 60.8* 61.7*  PLT 269 268   Basic Metabolic Panel  Recent Labs  11/19/16 0419 11/20/16 0516  NA 137 135  K 4.0 4.2  CL 105 103  CO2 26 23  GLUCOSE 138* 155*  BUN 13 12  CREATININE 1.01* 0.97  CALCIUM 9.1 9.1   Liver Function Tests No results for input(s): AST, ALT, ALKPHOS, BILITOT, PROT, ALBUMIN in the last 72 hours. No results for input(s): LIPASE, AMYLASE in the last 72 hours. Cardiac Enzymes No results for input(s): CKTOTAL, CKMB, CKMBINDEX, TROPONINI in the last 72 hours. BNP Invalid input(s): POCBNP D-Dimer No results for input(s): DDIMER in the last 72 hours. Hemoglobin A1C No results for input(s): HGBA1C in the last 72 hours. Fasting Lipid Panel No results for input(s): CHOL, HDL, LDLCALC, TRIG, CHOLHDL, LDLDIRECT in the last 72 hours. Thyroid Function Tests No results for input(s): TSH, T4TOTAL, T3FREE, THYROIDAB in the last 72 hours.  Invalid input(s): FREET3 _____________  Dg Chest 2 View  Result Date: 11/16/2016 CLINICAL DATA:  Chest pain, shortness of breath. EXAM: CHEST  2 VIEW COMPARISON:  Radiographs of September 29, 2013. FINDINGS: The heart size and mediastinal contours are within normal limits. Both lungs are clear. No pneumothorax or pleural effusion is noted. The visualized skeletal structures are unremarkable. IMPRESSION: No active cardiopulmonary disease. Electronically Signed   By: Marijo Conception, M.D.   On: 11/16/2016 12:01   Disposition   Pt is being discharged home today in good condition.  Follow-up Plans & Appointments    Follow-up Information    Murray Hodgkins, NP Follow up on 12/04/2016.   Specialties:  Nurse Practitioner, Cardiology, Radiology Why:  8:45am for  your hospital follow up appt.  Contact information: [address] [place] [address] [phone]          Discharge Instructions    Call MD for:  redness, tenderness, or signs of infection (pain, swelling, redness, odor or green/yellow discharge around incision site)    Complete by:  As directed    Diet - low sodium heart healthy    Complete by:  As directed    Discharge instructions    Complete by:  As directed    Radial Site Care Refer to this sheet in the next few weeks. These instructions provide you with information on caring for yourself after your procedure. Your caregiver may also give you more specific instructions. Your treatment has been planned according to current medical practices, but problems sometimes occur. Call your caregiver if you have any problems or questions after your procedure. HOME CARE INSTRUCTIONS You may shower the day after the procedure.Remove the bandage (dressing) and gently wash the site with plain soap and water.Gently pat the site dry.  Do not apply powder or lotion to the site.  Do not submerge the affected site in water for 3 to  5 days.  Inspect the site at least twice daily.  Do not flex or bend the affected arm for 24 hours.  No lifting over 5 pounds (2.3 kg) for 5 days after your procedure.  Do not drive home if you are discharged the same day of the procedure. Have someone else drive you.  You may drive 24 hours after the procedure unless otherwise instructed by your caregiver.  What to expect: Any bruising will usually fade within 1 to 2 weeks.  Blood that collects in the tissue (hematoma) may be painful to the touch. It should usually decrease in size and tenderness within 1 to 2 weeks.  SEEK IMMEDIATE MEDICAL CARE IF: You have unusual pain at the radial site.  You have redness, warmth, swelling, or pain at the radial site.  You have drainage (other than a small amount of blood on the dressing).  You have chills.  You  have a fever or persistent symptoms for more than 72 hours.  You have a fever and your symptoms suddenly get worse.  Your arm becomes pale, cool, tingly, or numb.  You have heavy bleeding from the site. Hold pressure on the site.   Increase activity slowly    Complete by:  As directed       Discharge Medications   Current Discharge Medication List    START taking these medications   Details  atorvastatin (LIPITOR) 80 MG tablet Take 1 tablet (80 mg total) by mouth daily at 6 PM. Qty: 30 tablet, Refills: 6    metoprolol tartrate (LOPRESSOR) 25 MG tablet Take 1 tablet (25 mg total) by mouth 2 (two) times daily. Qty: 60 tablet, Refills: 6    nitroGLYCERIN (NITROSTAT) 0.4 MG SL tablet Place 1 tablet (0.4 mg total) under the tongue every 5 (five) minutes x 3 doses as needed for chest pain. Qty: 25 tablet, Refills: 3      CONTINUE these medications which have CHANGED   Details  aspirin 81 MG chewable tablet Chew 1 tablet (81 mg total) by mouth daily. Qty: 30 tablet      CONTINUE these medications which have NOT CHANGED   Details  glipiZIDE (GLUCOTROL) 5 MG tablet Take 1 tablet (5 mg total) by mouth 2 (two) times daily before a meal. Qty: 180 tablet, Refills: 3   Associated Diagnoses: Diabetes mellitus type 2 in obese (HCC)    losartan-hydrochlorothiazide (HYZAAR) 100-12.5 MG tablet TAKE 1 TABLET BY MOUTH DAILY Qty: 90 tablet, Refills: 3   Associated Diagnoses: Essential hypertension    metFORMIN (GLUCOPHAGE) 1000 MG tablet TAKE 1 TABLET BY MOUTH TWICE A DAY WITH A MEAL Qty: 180 tablet, Refills: 3    Multiple Vitamin (MULTIVITAMIN WITH MINERALS) TABS tablet Take 1 tablet by mouth daily. Reported on 01/03/2016    PRESCRIPTION MEDICATION Inhale into the lungs at bedtime. CPAP    VICTOZA 18 MG/3ML SOPN START WITH 0.6 MG INJECTED [place] DAILY FOR ONE WEEK, THEN INCREASE TO 1.2 MG DAILY Qty: 6 mL, Refills: 3    !! blood glucose meter kit and supplies KIT Use up to four times daily as  directed.Dx: E11.9 Qty: 1 each, Refills: 0    !! Blood Glucose Monitoring Suppl (ACCU-CHEK AVIVA PLUS) W/DEVICE KIT     glucose blood test strip Use up to four times daily as directly. Dx E11.9 Qty: 400 each, Refills: 3    Insulin Pen Needle (PEN NEEDLES) 31G X 5 MM MISC 1 Units by Does not apply route daily. For  use with victoza pen Qty: 100 each, Refills: 3     !! - Potential duplicate medications found. Please discuss with provider.    STOP taking these medications     naproxen sodium (ALEVE) 220 MG tablet      pravastatin (PRAVACHOL) 20 MG tablet          Outstanding Labs/Studies   FLP and LFTs in 6 weeks given statin change.   Duration of Discharge Encounter   Greater than 30 minutes including physician time.  Signed, Reino Bellis NP-C 11/20/2016, 10:56 AM    Patient seen and examined. Agree with assessment and plan. No recurrent chest pain.  Now on metoprolol 25 mg bid for SVT/palpitations.  BP now on low side. No angina. Cath data reviewed. Will dc today.  Can titrate BB as outpatient as BP allows if needed.   Troy Sine, MD, [facility] 11/20/2016 10:56 AM

## 2016-11-20 NOTE — Progress Notes (Signed)
Reviewed dc instructions and RX with pt verb understanding. Pt left via wheelchair by tech with family in NAD.

## 2016-12-04 ENCOUNTER — Encounter: Payer: Self-pay | Admitting: Nurse Practitioner

## 2016-12-04 ENCOUNTER — Ambulatory Visit (INDEPENDENT_AMBULATORY_CARE_PROVIDER_SITE_OTHER): Payer: BLUE CROSS/BLUE SHIELD | Admitting: Nurse Practitioner

## 2016-12-04 VITALS — BP 142/72 | HR 66 | Ht 60.0 in | Wt 238.8 lb

## 2016-12-04 DIAGNOSIS — I10 Essential (primary) hypertension: Secondary | ICD-10-CM | POA: Diagnosis not present

## 2016-12-04 DIAGNOSIS — E782 Mixed hyperlipidemia: Secondary | ICD-10-CM

## 2016-12-04 DIAGNOSIS — I2510 Atherosclerotic heart disease of native coronary artery without angina pectoris: Secondary | ICD-10-CM

## 2016-12-04 DIAGNOSIS — I214 Non-ST elevation (NSTEMI) myocardial infarction: Secondary | ICD-10-CM

## 2016-12-04 MED ORDER — ISOSORBIDE MONONITRATE ER 30 MG PO TB24: 15.0000 mg | ORAL_TABLET | Freq: Every day | ORAL | 3 refills | Status: DC

## 2016-12-04 NOTE — Patient Instructions (Signed)
Medication Instructions: Ignacia Bayley, NP has recommended making the following medication changes: 1. START Isosorbide 30 mg tablets - take 0.5 tablet (15 mg total) by mouth daily  Labwork: Your physician recommends that you return for lab work in 6 weeks.  Testing/Procedures: NONE ORDERED  Follow-up: Your physician recommends that you schedule a follow-up appointment in 1 month with Gerald Stabs or Dr Ellyn Hack.  If you need a refill on your cardiac medications before your next appointment, please call your pharmacy.

## 2016-12-04 NOTE — Progress Notes (Signed)
Office Visit    Patient Name: Lynn [name] Date of Encounter: 12/04/2016  Primary Care Provider:  Lamar Blinks, MD Primary Cardiologist:  Roni Bread, MD  Chief Complaint    61 year old female with a history of nonobstructive CAD, diabetes, diastolic dysfunction, hypertension, hyperlipidemia, sleep apnea, and obesity, who presents for follow-up after recent non-STEMI.  Past Medical History    Past Medical History:  Diagnosis Date  . Arthritis   . CAD (coronary artery disease)    a. 11/2016 NSTEMI/Cath: LM nl, LAD 40p, 82m, D1/2 small, LCX 40ost, OM2/3 nl/small, RCA 35 ost/mid, RPDA/RPL small/nl.  . [place] [facility] [place]    [place] in [place] 2016  . Diabetes mellitus without complication ([place]   . Diastolic dysfunction    a. 11/2016 Echo: EF 55-60%, gr1 DD, sev calcified MV annulus, mildly dil LA.  .Marland KitchenEye hemorrhage 4/16   resolved rt  . Hyperlipidemia   . Hypertension   . Microcytic anemia   . Sleep apnea    use CPAP  . Thalassemia   . Ulcer ([facility]    Past Surgical History:  Procedure Laterality Date  . ABDOMINAL HYSTERECTOMY    . arthroscopic knee Left 2-16  . CARDIAC CATHETERIZATION N/A 11/19/2016   Procedure: Left Heart Cath and Coronary Angiography;  Surgeon: HBelva Crome MD;  [facility];  Service: Cardiovascular;  Laterality: N/A;  . CARPAL TUNNEL RELEASE Right 11/01/2014  . COLONOSCOPY    . JOINT REPLACEMENT    . TONSILLECTOMY    . TOTAL KNEE ARTHROPLASTY Right 02/18/2015   Procedure: RIGHT TOTAL KNEE ARTHROPLASTY;  Surgeon: SNetta Cedars MD;  [place]  Service: Orthopedics;  Laterality: Right;  . TOTAL KNEE ARTHROPLASTY Left 08/19/2015   Procedure: LEFT TOTAL KNEE ARTHROPLASTY;  Surgeon: SNetta Cedars MD;  [facility]  Service: Orthopedics;  Laterality: Left;    Allergies  Allergies  Allergen Reactions  . Penicillins Hives    Has patient had a PCN reaction causing immediate rash, facial/tongue/throat swelling, SOB or  lightheadedness with hypotension: Yes Has patient had a PCN reaction causing severe rash involving mucus membranes or skin necrosis: No Has patient had a PCN reaction that required hospitalization No Has patient had a PCN reaction occurring within the last 10 years: No If all of the above answers are "NO", then may proceed with Cephalosporin use.    History of Present Illness    61year old female with the above complex past medical history including hypertension, hyperlipidemia, obesity, sleep apnea, and thalassemia. She was recently hospitalized with complaints of chest pain and dyspnea and ruled in for non-STEMI. Echo showed normal LV function. Catheterization showed nonobstructive CAD. Medical therapy was recommended. She was anemic throughout her hospitalization. She has been treated with aspirin, statin, beta blocker, and ARB therapy. Since discharge, she notes dyspnea on exertion and also intermittent chest discomfort, for which she takes nitroglycerin. She does check her blood pressure at home and notes it is typically in the 130s. With regards to dyspnea on exertion, this has been present for years. She notes that this occurs with minimal activity and suspects this is at least in some way related to deconditioning. She has not been having any PND, orthopnea, dizziness, syncope, edema, or early satiety. She is interested in cardiac rehabilitation.  Home Medications    Prior to Admission medications   Medication Sig Start Date End Date Taking? Authorizing Provider  aspirin 81 MG chewable tablet Chew 1 tablet (81 mg total) by  mouth daily. 11/21/16   Cheryln Manly, NP  atorvastatin (LIPITOR) 80 MG tablet Take 1 tablet (80 mg total) by mouth daily at 6 PM. 11/20/16   Cheryln Manly, NP  blood glucose meter kit and supplies KIT Use up to four times daily as directed.Dx: E11.9 06/20/16   [name], MD  Blood Glucose Monitoring Suppl (ACCU-CHEK AVIVA PLUS) W/DEVICE KIT  01/19/15    Historical Provider, MD  glipiZIDE (GLUCOTROL) 5 MG tablet Take 1 tablet (5 mg total) by mouth 2 (two) times daily before a meal. 06/04/16   Gay Filler Copland, MD  glucose blood test strip Use up to four times daily as directly. Dx E11.9 09/13/16   [name], MD  Insulin Pen Needle (PEN NEEDLES) 31G X 5 MM MISC 1 Units by Does not apply route daily. For use with victoza pen 05/28/16   [name], MD  isosorbide mononitrate (IMDUR) 30 MG 24 hr tablet Take 0.5 tablets (15 mg total) by mouth daily. 12/04/16 11/29/17  Rogelia Mire, NP  losartan-hydrochlorothiazide (HYZAAR) 100-12.5 MG tablet TAKE 1 TABLET BY MOUTH DAILY Patient taking differently: Take 1 tablet by mouth daily with supper. TAKE 1 TABLET BY MOUTH DAIL 05/14/16   Gay Filler Copland, MD  metFORMIN (GLUCOPHAGE) 1000 MG tablet TAKE 1 TABLET BY MOUTH TWICE A DAY WITH A MEAL Patient taking differently: Take 1,000 mg by mouth 2 (two) times daily with a meal.  06/04/16   Gay Filler Copland, MD  metoprolol tartrate (LOPRESSOR) 25 MG tablet Take 1 tablet (25 mg total) by mouth 2 (two) times daily. 11/20/16   Cheryln Manly, NP  Multiple Vitamin (MULTIVITAMIN WITH MINERALS) TABS tablet Take 1 tablet by mouth daily. Reported on 01/03/2016    Historical Provider, MD  nitroGLYCERIN (NITROSTAT) 0.4 MG SL tablet Place 1 tablet (0.4 mg total) under the tongue every 5 (five) minutes x 3 doses as needed for chest pain. 11/20/16   Cheryln Manly, NP  PRESCRIPTION MEDICATION Inhale into the lungs at bedtime. CPAP    Historical Provider, MD  VICTOZA 18 MG/3ML SOPN START WITH 0.6 MG INJECTED [place] DAILY FOR ONE WEEK, THEN INCREASE TO 1.2 MG DAILY 09/10/16   [name], MD    Review of Systems    As above, she has had intermittent nitrate responsive chest discomfort. She is chronic dyspnea and exertion. She denies PND, orthopnea, palpitations, dizziness, syncope, edema, or early satiety.  All other systems reviewed and are otherwise negative  except as noted above.  Physical Exam    VS:  BP (!) 142/72   Pulse 66   Ht 5' (1.524 m)   Wt 238 lb 12.8 oz (108.3 kg)   BMI 46.64 kg/m  , BMI Body mass index is 46.64 kg/m. GEN: Well nourished, well developed, in no acute distress.  HEENT: normal.  Neck: Supple, no JVD, carotid bruits, or masses. Cardiac: RRR, no murmurs, rubs, or gallops. No clubbing, cyanosis, edema.  Radials/DP/PT 2+ and equal bilaterally. Right radial Site without bleeding, bruit, or hematoma. Respiratory:  Respirations regular and unlabored, clear to auscultation bilaterally. GI: Soft, nontender, nondistended, BS + x 4. MS: no deformity or atrophy. Skin: warm and dry, no rash. Neuro:  Strength and sensation are intact. Psych: Normal affect.  Accessory Clinical Findings    ECG - Regular sinus rhythm, 66, delayed R-wave progression, no acute changes.  Assessment & Plan    1.  Non-STEMI, subsequent episode of care/coronary artery disease:  Patient was recently admitted with chest pain and ruled in for non-STEMI. Echo showed normal LV function. Cath showed moderate nonobstructive CAD.Marland Kitchen Medical therapy was recommended and she has been managed with aspirin, statin, beta blocker, ARB. She has been having intermittent nitrate responsive chest discomfort. In that setting, along with mildly elevated blood pressure, I'm adding isosorbide mononitrate 15 mg daily. I do think she would benefit from cardiac rehabilitation and we discussed this today. We will make a referral.  2. Essential hypertension: Blood pressure is elevated today at 142/72. She says it typically runs in the mid 130s. I'm adding isosorbide mononitrate as above. Continue beta blocker, ARB.  3. Hyperlipidemia: LDL 99 on recent evaluation. Follow-up lipids and LFTs in about 6 weeks given switch to Lipitor 80 mg.  4. Type 2 diabetes mellitus: A1c was 7.7 on February 3. She is on glipizide and metformin and this is managed by primary care.  5. Morbid  obesity: Patient is likely deconditioned and this may account for some of her dyspnea. She is interested in cardiac rehabilitation and I will make referral.  6. Thalassemia: Followed by primary care. Hemoglobin appears to trend between 8 and 10. She was between 8.6 and 9.5 during recent hospitalization. Certainly anemia may be playing a role and deconditioning and dyspnea as well.  7. Disposition: Follow-up in approximately 4-6 weeks.  Murray Hodgkins, NP 12/04/2016, 1:00 PM

## 2016-12-08 DIAGNOSIS — I2510 Atherosclerotic heart disease of native coronary artery without angina pectoris: Secondary | ICD-10-CM

## 2016-12-10 ENCOUNTER — Encounter: Payer: Self-pay | Admitting: Family Medicine

## 2016-12-10 ENCOUNTER — Ambulatory Visit (INDEPENDENT_AMBULATORY_CARE_PROVIDER_SITE_OTHER): Payer: BLUE CROSS/BLUE SHIELD | Admitting: Family Medicine

## 2016-12-10 VITALS — BP 134/82 | HR 70 | Temp 98.3°F | Ht 60.0 in | Wt 236.4 lb

## 2016-12-10 DIAGNOSIS — I10 Essential (primary) hypertension: Secondary | ICD-10-CM

## 2016-12-10 DIAGNOSIS — E669 Obesity, unspecified: Secondary | ICD-10-CM | POA: Diagnosis not present

## 2016-12-10 DIAGNOSIS — I214 Non-ST elevation (NSTEMI) myocardial infarction: Secondary | ICD-10-CM

## 2016-12-10 DIAGNOSIS — E1169 Type 2 diabetes mellitus with other specified complication: Secondary | ICD-10-CM

## 2016-12-10 LAB — TSH: TSH: 1.98 u[IU]/mL (ref 0.35–4.50)

## 2016-12-10 NOTE — Progress Notes (Signed)
[place] at [facility] [address] [place], [place] [address] [phone] [id number]  Date:  12/10/2016   Name:  Lynn [name]   DOB:  Mar 05, 1956   MRN:  [id number]  PCP:  Lamar Blinks, MD    Chief Complaint: Hospitalization Follow-up (Pt here to follow up from ER admission on 11/16/16. Will need A1C done today.  Pt was started on a beta blocker by ER , pt would like to know once she runs out should she continue taking it. Would like to discuss weight and diet. )   History of Present Illness:  Lynn [name] is a 61 y.o. very pleasant female patient who presents with the following:  Admitted to the [facility] on unit 3W  11-16-2016 to 11-20-2016 with NSTEMI.  She came in from home with CP to the ER, was admitted and had a cath which was virtually negative- no stents needed  Saw cardiology Sabino Niemann, NP) on 12-04-2016.  Next appointment is in 6 weeks. She is using a BB, aspirin, lipitor, glipizide, imdur, hyzaar, metformin, lopressor, nitro prn  Blood sugar "went crazy" after she was discharged from the hospital.  Now taking her metformin, glipizide and 1.2 of Victoza.  CBGs are now 140-160. However too soon to repeat an A1c now  Has had to use almost a whole bottle of nitro after her hospital DC due to chest pain. Cardiology started her on imdur and her CP is virtually resolved now, she no longer needs her nitro Interested in weight loss surgery.  Wants to loose weight one way or another.  Cardiology said that she was OK to exercise and she will be participating in cardiac rehab soon  Wants to have her thyroid checked to ensure this is not why she cannot lose weigh- will do for her today Lab Results  Component Value Date   HGBA1C 7.7 (H) 11/17/2016    Patient Active Problem List   Diagnosis Date Noted  . CAD in native artery   . SVT (supraventricular tachycardia) ([place])   . Non-STEMI (non-ST elevated myocardial  infarction) ([place]) 11/16/2016  . Chest pain 11/16/2016  . H/O total knee replacement 08/19/2015  . Vision, loss, sudden 03/07/2015  . S/P TKR (total knee replacement) using cement 02/18/2015  . Thalassemia 09/02/2013  . Hyperlipidemia LDL goal < 100 08/24/2012  . Diabetes mellitus ([place]) 07/22/2012  . Hypertension 07/22/2012    Past Medical History:  Diagnosis Date  . Arthritis   . CAD (coronary artery disease)    a. 11/2016 NSTEMI/Cath: LM nl, LAD 40p, 69m, D1/2 small, LCX 40ost, OM2/3 nl/small, RCA 35 ost/mid, RPDA/RPL small/nl.  . Clotting disorder ([place]    blood clot in eye 2016  . Diabetes mellitus without complication ([place]   . Diastolic dysfunction    a. 11/2016 Echo: EF 55-60%, gr1 DD, sev calcified MV annulus, mildly dil LA.  .Marland KitchenEye hemorrhage 4/16   resolved rt  . Hyperlipidemia   . Hypertension   . Microcytic anemia   . Sleep apnea    use CPAP  . Thalassemia   . Ulcer ([facility]     Past Surgical History:  Procedure Laterality Date  . ABDOMINAL HYSTERECTOMY    . arthroscopic knee Left 2-16  . CARDIAC CATHETERIZATION N/A 11/19/2016   Procedure: Left Heart Cath and Coronary Angiography;  Surgeon: HBelva Crome MD;  [facility];  Service: Cardiovascular;  Laterality: N/A;  . CARPAL TUNNEL RELEASE  Right 11/01/2014  . COLONOSCOPY    . JOINT REPLACEMENT    . TONSILLECTOMY    . TOTAL KNEE ARTHROPLASTY Right 02/18/2015   Procedure: RIGHT TOTAL KNEE ARTHROPLASTY;  Surgeon: Beverely Low, MD;  Location: [facility] OR;  Service: Orthopedics;  Laterality: Right;  . TOTAL KNEE ARTHROPLASTY Left 08/19/2015   Procedure: LEFT TOTAL KNEE ARTHROPLASTY;  Surgeon: Beverely Low, MD;  Location: [facility] OR;  Service: Orthopedics;  Laterality: Left;    Social History  Substance Use Topics  . Smoking status: Former Smoker    Packs/day: 0.25    Years: 44.00    Types: Cigarettes    Quit date: 02/17/2015  . Smokeless tobacco: Never Used  . Alcohol use 0.0 oz/week     Comment: socially     Family History  Problem Relation Age of Onset  . Cancer Mother   . Diabetes Mother   . Hypertension Mother   . Hyperlipidemia Mother   . Cancer Father   . Hyperlipidemia Father   . Mental illness Sister   . Diabetes Sister   . Diabetes Maternal Grandmother   . Diabetes Maternal Grandfather   . Colon cancer Neg Hx   . Esophageal cancer Neg Hx   . Stomach cancer Neg Hx   . Rectal cancer Neg Hx     Allergies  Allergen Reactions  . Penicillins Hives    Has patient had a PCN reaction causing immediate rash, facial/tongue/throat swelling, SOB or lightheadedness with hypotension: Yes Has patient had a PCN reaction causing severe rash involving mucus membranes or skin necrosis: No Has patient had a PCN reaction that required hospitalization No Has patient had a PCN reaction occurring within the last 10 years: No If all of the above answers are "NO", then may proceed with Cephalosporin use.    Medication list has been reviewed and updated.  Current Outpatient Prescriptions on File Prior to Visit  Medication Sig Dispense Refill  . aspirin 81 MG chewable tablet Chew 1 tablet (81 mg total) by mouth daily. 30 tablet   . atorvastatin (LIPITOR) 80 MG tablet Take 1 tablet (80 mg total) by mouth daily at 6 PM. 30 tablet 6  . blood glucose meter kit and supplies KIT Use up to four times daily as directed.Dx: E11.9 1 each 0  . Blood Glucose Monitoring Suppl (ACCU-CHEK AVIVA PLUS) W/DEVICE KIT     . glipiZIDE (GLUCOTROL) 5 MG tablet Take 1 tablet (5 mg total) by mouth 2 (two) times daily before a meal. 180 tablet 3  . glucose blood test strip Use up to four times daily as directly. Dx E11.9 400 each 3  . Insulin Pen Needle (PEN NEEDLES) 31G X 5 MM MISC 1 Units by Does not apply route daily. For use with victoza pen 100 each 3  . isosorbide mononitrate (IMDUR) 30 MG 24 hr tablet Take 0.5 tablets (15 mg total) by mouth daily. 45 tablet 3  . losartan-hydrochlorothiazide (HYZAAR) 100-12.5 MG  tablet TAKE 1 TABLET BY MOUTH DAILY (Patient taking differently: Take 1 tablet by mouth daily with supper. TAKE 1 TABLET BY MOUTH DAIL) 90 tablet 3  . metFORMIN (GLUCOPHAGE) 1000 MG tablet TAKE 1 TABLET BY MOUTH TWICE A DAY WITH A MEAL (Patient taking differently: Take 1,000 mg by mouth 2 (two) times daily with a meal. ) 180 tablet 3  . metoprolol tartrate (LOPRESSOR) 25 MG tablet Take 1 tablet (25 mg total) by mouth 2 (two) times daily. 60 tablet 6  . Multiple Vitamin (MULTIVITAMIN  WITH MINERALS) TABS tablet Take 1 tablet by mouth daily. Reported on 01/03/2016    . nitroGLYCERIN (NITROSTAT) 0.4 MG SL tablet Place 1 tablet (0.4 mg total) under the tongue every 5 (five) minutes x 3 doses as needed for chest pain. 25 tablet 3  . PRESCRIPTION MEDICATION Inhale into the lungs at bedtime. CPAP    . VICTOZA 18 MG/3ML SOPN START WITH 0.6 MG INJECTED [place] DAILY FOR ONE WEEK, THEN INCREASE TO 1.2 MG DAILY 6 mL 3   No current facility-administered medications on file prior to visit.     Review of Systems: Review of Systems  Constitutional: Negative for chills, fever, malaise/fatigue and weight loss.  Eyes: Negative for blurred vision.  Respiratory: Negative for cough and shortness of breath.   Cardiovascular: Positive for chest pain. Negative for palpitations.  Gastrointestinal: Negative for abdominal pain, heartburn, nausea and vomiting.  Neurological: Negative for dizziness, tingling and headaches.  Psychiatric/Behavioral: Negative for depression. The patient is not nervous/anxious.   All other systems reviewed and are negative.    Physical Examination: Vitals:   12/10/16 1326  BP: 134/82  Pulse: 70  Temp: 98.3 F (36.8 C)   Vitals:   12/10/16 1326  Weight: 236 lb 6.4 oz (107.2 kg)  Height: 5' (1.524 m)   Body mass index is 46.17 kg/m. Ideal Body Weight: Weight in (lb) to have BMI = 25: 127.7  Physical Examination: General appearance - alert, well appearing, and in no distress, oriented  to person, place, and time and overweight Mental status - alert, oriented to person, place, and time, normal mood, behavior, speech, dress, motor activity, and thought processes Eyes - pupils equal and reactive, extraocular eye movements intact Ears - bilateral TM's and external ear canals normal Nose - normal and patent, no erythema, discharge or polyps Mouth - mucous membranes moist, pharynx normal without lesions Neck - supple, no significant adenopathy Lymphatics - no palpable lymphadenopathy, no hepatosplenomegaly Chest - clear to auscultation, no wheezes, rales or rhonchi, symmetric air entry Heart - normal rate, regular rhythm, normal S1, S2, no murmurs, rubs, clicks or gallops Abdomen - soft, nontender, nondistended, no masses or organomegaly Neurological - alert, oriented, normal speech, no focal findings or movement disorder noted Musculoskeletal - no joint tenderness, deformity or swelling Extremities - peripheral pulses normal, no pedal edema, no clubbing or cyanosis Skin - normal coloration and turgor, no rashes, no suspicious skin lesions noted   Assessment and Plan:  Diabetes mellitus type 2 in obese ([place])  Essential hypertension - Plan: TSH  Obesity with serious comorbidity, unspecified classification, unspecified obesity type - Plan: TSH  NSTEMI (non-ST elevated myocardial infarction) ([place])  Here today to follow-up from NSTEMI. She is feeling back to her normal state of health and is committed to making some changes to improve her health  She will see me in about 2 months   Patient instructions:  It was good to see you today- I am glad that you are doing better!  It is too soon to repeat an A1c today- we will want to wait about 2 months and then repeat However we will check a thyroid for you today Please do continue to take your metoprolol and other medications as prescribed by your cardiologist    Signed Lamar Blinks, MD

## 2016-12-10 NOTE — Patient Instructions (Addendum)
It was good to see you today- I am glad that you are doing better!  It is too soon to repeat an A1c today- we will want to wait about 2 months and then repeat However we will check a thyroid for you today Please do continue to take your metoprolol and other medications as prescribed by your cardiologist

## 2016-12-10 NOTE — Progress Notes (Signed)
Pre visit review using our clinic review tool, if applicable. No additional management support is needed unless otherwise documented below in the visit note. 

## 2016-12-27 ENCOUNTER — Ambulatory Visit (INDEPENDENT_AMBULATORY_CARE_PROVIDER_SITE_OTHER): Payer: BLUE CROSS/BLUE SHIELD | Admitting: Nurse Practitioner

## 2016-12-27 ENCOUNTER — Encounter: Payer: Self-pay | Admitting: Nurse Practitioner

## 2016-12-27 VITALS — BP 122/76 | HR 72 | Ht 60.0 in | Wt 238.0 lb

## 2016-12-27 DIAGNOSIS — E785 Hyperlipidemia, unspecified: Secondary | ICD-10-CM | POA: Diagnosis not present

## 2016-12-27 DIAGNOSIS — I2510 Atherosclerotic heart disease of native coronary artery without angina pectoris: Secondary | ICD-10-CM

## 2016-12-27 DIAGNOSIS — I10 Essential (primary) hypertension: Secondary | ICD-10-CM

## 2016-12-27 NOTE — Progress Notes (Signed)
Office Visit    Patient Name: Lynn [name] Date of Encounter: 12/27/2016  Primary Care Provider:  Lamar Blinks, MD Primary Cardiologist:  Roni Bread, MD   Chief Complaint    1 old female with history of known obstructive CAD, diabetes, diastolic dysfunction, hypertension, hyperlipidemia, sleep apnea, and obesity who presents for follow-up.  Past Medical History    Past Medical History:  Diagnosis Date  . Arthritis   . CAD (coronary artery disease)    a. 11/2016 NSTEMI/Cath: LM nl, LAD 40p, 32m, D1/2 small, LCX 40ost, OM2/3 nl/small, RCA 35 ost/mid, RPDA/RPL small/nl.  . Clotting disorder ([facility]    blood clot in eye 2016  . Diabetes mellitus without complication ([facility]   . Diastolic dysfunction    a. 11/2016 Echo: EF 55-60%, gr1 DD, sev calcified MV annulus, mildly dil LA.  .Marland KitchenEye hemorrhage 4/16   resolved rt  . Hyperlipidemia   . Hypertension   . Microcytic anemia   . Sleep apnea    use CPAP  . Thalassemia   . Ulcer ([facility]    Past Surgical History:  Procedure Laterality Date  . ABDOMINAL HYSTERECTOMY    . arthroscopic knee Left 2-16  . CARDIAC CATHETERIZATION N/A 11/19/2016   Procedure: Left Heart Cath and Coronary Angiography;  Surgeon: HBelva Crome MD;  [facility];  Service: Cardiovascular;  Laterality: N/A;  . CARPAL TUNNEL RELEASE Right 11/01/2014  . COLONOSCOPY    . JOINT REPLACEMENT    . TONSILLECTOMY    . TOTAL KNEE ARTHROPLASTY Right 02/18/2015   Procedure: RIGHT TOTAL KNEE ARTHROPLASTY;  Surgeon: SNetta Cedars MD;  [place] [facility]  Service: Orthopedics;  Laterality: Right;  . TOTAL KNEE ARTHROPLASTY Left 08/19/2015   Procedure: LEFT TOTAL KNEE ARTHROPLASTY;  Surgeon: SNetta Cedars MD;  [place] [facility]  Service: Orthopedics;  Laterality: Left;    Allergies  Allergies  Allergen Reactions  . Penicillins Hives    Has patient had a PCN reaction causing immediate rash, facial/tongue/throat swelling, SOB or lightheadedness with  hypotension: Yes Has patient had a PCN reaction causing severe rash involving mucus membranes or skin necrosis: No Has patient had a PCN reaction that required hospitalization No Has patient had a PCN reaction occurring within the last 10 years: No If all of the above answers are "NO", then may proceed with Cephalosporin use.    History of Present Illness    61year old female with the above past medical history including hypertension, hyperlipidemia, obesity, sleep apnea, and thalassemia. I recently saw her in February 20 after hospitalization for non-STEMI. Echo at that time showed normal LV function well catheterization showed nonobstructive CAD. At the time of her office visit, she reported intermittent nitrate responsive chest discomfort. I added isosorbide mononitrate 15 mg daily. Since her last visit on February 20, she has had no further chest discomfort and has been increasing her activity and overall feeling quite well. She says she didn't realize how poorly she felt prior to her hospitalization. She denies dyspnea, PND, orthopnea, dizziness, syncope, edema, or early satiety. She remains interested in cardiac rehabilitation.  Home Medications    Prior to Admission medications   Medication Sig Start Date End Date Taking? Authorizing Provider  aspirin 81 MG chewable tablet Chew 1 tablet (81 mg total) by mouth daily. 11/21/16  Yes LCheryln Manly NP  atorvastatin (LIPITOR) 80 MG tablet Take 1 tablet (80 mg total) by mouth daily at 6 PM. 11/20/16  Yes LCheryln Manly NP  blood glucose meter kit and supplies KIT Use up to four times daily as directed.Dx: E11.9 06/20/16  Yes [name], MD  Blood Glucose Monitoring Suppl (ACCU-CHEK AVIVA PLUS) W/DEVICE KIT  01/19/15  Yes Historical Provider, MD  glipiZIDE (GLUCOTROL) 5 MG tablet Take 1 tablet (5 mg total) by mouth 2 (two) times daily before a meal. 06/04/16  Yes Jessica C Copland, MD  glucose blood test strip Use up to four times daily as  directly. Dx E11.9 09/13/16  Yes Gay Filler Copland, MD  Insulin Pen Needle (PEN NEEDLES) 31G X 5 MM MISC 1 Units by Does not apply route daily. For use with victoza pen 05/28/16  Yes Gay Filler Copland, MD  isosorbide mononitrate (IMDUR) 30 MG 24 hr tablet Take 0.5 tablets (15 mg total) by mouth daily. 12/04/16 11/29/17 Yes Rogelia Mire, NP  losartan-hydrochlorothiazide (HYZAAR) 100-12.5 MG tablet TAKE 1 TABLET BY MOUTH DAILY Patient taking differently: Take 1 tablet by mouth daily with supper. TAKE 1 TABLET BY MOUTH DAIL 05/14/16  Yes Gay Filler Copland, MD  metoprolol tartrate (LOPRESSOR) 25 MG tablet Take 1 tablet (25 mg total) by mouth 2 (two) times daily. 11/20/16  Yes Cheryln Manly, NP  Multiple Vitamin (MULTIVITAMIN WITH MINERALS) TABS tablet Take 1 tablet by mouth daily. Reported on 01/03/2016   Yes Historical Provider, MD  nitroGLYCERIN (NITROSTAT) 0.4 MG SL tablet Place 1 tablet (0.4 mg total) under the tongue every 5 (five) minutes x 3 doses as needed for chest pain. 11/20/16  Yes Cheryln Manly, NP  PRESCRIPTION MEDICATION Inhale into the lungs at bedtime. CPAP   Yes Historical Provider, MD  VICTOZA 18 MG/3ML SOPN START WITH 0.6 MG INJECTED [place] DAILY FOR ONE WEEK, THEN INCREASE TO 1.2 MG DAILY 09/10/16  Yes [name], MD    Review of Systems    As above, doing quite well.  She denies chest pain, palpitations, dyspnea, pnd, orthopnea, n, v, dizziness, syncope, edema, weight gain, or early satiety.  All other systems reviewed and are otherwise negative except as noted above.  Physical Exam    VS:  BP 122/76   Pulse 72   Ht 5' (1.524 m)   Wt 238 lb (108 kg)   BMI 46.48 kg/m  , BMI Body mass index is 46.48 kg/m. GEN: Well nourished, well developed, in no acute distress.  HEENT: normal.  Neck: Supple, no JVD, carotid bruits, or masses. Cardiac: RRR, no murmurs, rubs, or gallops. No clubbing, cyanosis, edema.  Radials/DP/PT 2+ and equal bilaterally.  Respiratory:   Respirations regular and unlabored, clear to auscultation bilaterally. GI: Soft, nontender, nondistended, BS + x 4. MS: no deformity or atrophy. Skin: warm and dry, no rash. Neuro:  Strength and sensation are intact. Psych: Normal affect.  Accessory Clinical Findings    None  Assessment & Plan    1.  Nonobstructive coronary artery disease: Status post recent non-STEMI with catheterization revealing nonobstructive CAD. EF was normal by echo. When I last saw her, she was having intermittent nitrate responsive chest pain and I added Imdur 15 mg daily. She's had no further chest pain and is otherwise tolerating medications well. She remains on aspirin, statin, beta blocker, and ARB in addition to Imdur. She does plan on participating in cardiac rehabilitation.  2. Essential hypertension: Blood pressure stable today. Continue beta blocker, ARB, and nitrate.  3. Hyperlipidemia: LDL 99 during recent hospitalization. She is scheduled for follow-up lipids and LFTs in a few weeks.  4. Type 2 diabetes mellitus: This is followed closely by her primary care provider. A1c was 7.7 on February 3. She is quite pleased with the way Victoza and glipizide are working.  5. Morbid obesity: She does plan on participating in cardiac rehabilitation.  6. Disposition: Follow-up with Dr. Ellyn Hack in 3 months or sooner if necessary.  Murray Hodgkins, NP 12/27/2016, 1:56 PM

## 2016-12-27 NOTE — Patient Instructions (Signed)
Your physician wants you to follow-up in: [employer] with Dr. Ellyn Hack. You will receive a reminder letter in the mail two months in advance. If you don't receive a letter, please call our office to schedule the follow-up appointment.

## 2016-12-28 ENCOUNTER — Other Ambulatory Visit: Payer: Self-pay | Admitting: *Deleted

## 2016-12-28 DIAGNOSIS — I214 Non-ST elevation (NSTEMI) myocardial infarction: Secondary | ICD-10-CM

## 2017-01-05 ENCOUNTER — Other Ambulatory Visit: Payer: Self-pay | Admitting: Family Medicine

## 2017-01-22 ENCOUNTER — Encounter (HOSPITAL_COMMUNITY): Payer: Self-pay

## 2017-01-22 ENCOUNTER — Telehealth: Payer: Self-pay | Admitting: Family Medicine

## 2017-01-22 ENCOUNTER — Encounter (HOSPITAL_COMMUNITY)
Admission: RE | Admit: 2017-01-22 | Discharge: 2017-01-22 | Disposition: A | Discharge status: Home / Self Care | Payer: BLUE CROSS/BLUE SHIELD | Source: Ambulatory Visit | Attending: Cardiology | Admitting: Cardiology

## 2017-01-22 ENCOUNTER — Telehealth: Payer: Self-pay | Admitting: Cardiology

## 2017-01-22 VITALS — BP 118/68 | HR 84 | Ht 63.25 in | Wt 236.8 lb

## 2017-01-22 DIAGNOSIS — I2510 Atherosclerotic heart disease of native coronary artery without angina pectoris: Secondary | ICD-10-CM | POA: Diagnosis not present

## 2017-01-22 DIAGNOSIS — Z96653 Presence of artificial knee joint, bilateral: Secondary | ICD-10-CM | POA: Insufficient documentation

## 2017-01-22 DIAGNOSIS — E785 Hyperlipidemia, unspecified: Secondary | ICD-10-CM | POA: Diagnosis not present

## 2017-01-22 DIAGNOSIS — I214 Non-ST elevation (NSTEMI) myocardial infarction: Secondary | ICD-10-CM | POA: Insufficient documentation

## 2017-01-22 DIAGNOSIS — E119 Type 2 diabetes mellitus without complications: Secondary | ICD-10-CM | POA: Insufficient documentation

## 2017-01-22 DIAGNOSIS — Z87891 Personal history of nicotine dependence: Secondary | ICD-10-CM | POA: Insufficient documentation

## 2017-01-22 DIAGNOSIS — I10 Essential (primary) hypertension: Secondary | ICD-10-CM | POA: Diagnosis not present

## 2017-01-22 NOTE — Telephone Encounter (Signed)
Good plan.  [name]

## 2017-01-22 NOTE — Telephone Encounter (Signed)
New message      Lynn [name] is calling to report pt c/o lightheadedness today at cardiac rehab.  She was able to exercise but wanted [place] to follow up with pt

## 2017-01-22 NOTE — Progress Notes (Signed)
Cardiac Rehab Medication Review by a Pharmacist  Does the patient  feel that his/her medications are working for him/her?  yes  Has the patient been experiencing any side effects to the medications prescribed?  yes  Does the patient measure his/her own blood pressure or blood glucose at home?  yes   Does the patient have any problems obtaining medications due to transportation or finances?   no  Understanding of regimen: good Understanding of indications: good Potential of compliance: good    Pharmacist comments: Patient presents in good spirits, ambulating without assistance -glipizide - advised to take with meals -no issues with hypoglycemia  -No n/v with victoza - titrated up to 1.2mg   without issue -Counseled on expiration date for SL NTG -No s/sx hypotension or bradycardia -Per patient, was on metformin but was unable to tolerate due to GI upset, was advised to stop by GI doctor  Carlean Jews, Pharm.D. PGY1 Pharmacy Resident 4/10/20188:51 AM Pager [phone]

## 2017-01-22 NOTE — Progress Notes (Addendum)
Cardiac Individual Treatment Plan  Patient Details  Name: Lynn [name] MRN: [id number] Date of Birth: 1956/07/12 Referring Provider:     CARDIAC REHAB PHASE II ORIENTATION from 01/22/2017 in [facility] CARDIAC REHAB  Referring Provider  Bryan Lemma, MD.      Initial Encounter Date:    CARDIAC REHAB PHASE II ORIENTATION from 01/22/2017 in [facility] CARDIAC REHAB  Date  01/22/17  Referring Provider  Bryan Lemma, MD.      Visit Diagnosis: NSTEMI (non-ST elevated myocardial infarction) [facility])  Patient's Home Medications on Admission:  Current Outpatient Prescriptions:  .  aspirin 81 MG chewable tablet, Chew 1 tablet (81 mg total) by mouth daily., Disp: 30 tablet, Rfl:  .  atorvastatin (LIPITOR) 80 MG tablet, Take 1 tablet (80 mg total) by mouth daily at 6 PM., Disp: 30 tablet, Rfl: 6 .  blood glucose meter kit and supplies KIT, Use up to four times daily as directed.Dx: E11.9, Disp: 1 each, Rfl: 0 .  Blood Glucose Monitoring Suppl (ACCU-CHEK AVIVA PLUS) W/DEVICE KIT, , Disp: , Rfl:  .  glipiZIDE (GLUCOTROL) 5 MG tablet, Take 1 tablet (5 mg total) by mouth 2 (two) times daily before a meal., Disp: 180 tablet, Rfl: 3 .  glucose blood test strip, Use up to four times daily as directly. Dx E11.9, Disp: 400 each, Rfl: 3 .  Insulin Pen Needle (PEN NEEDLES) 31G X 5 MM MISC, 1 Units by Does not apply route daily. For use with victoza pen, Disp: 100 each, Rfl: 3 .  isosorbide mononitrate (IMDUR) 30 MG 24 hr tablet, Take 0.5 tablets (15 mg total) by mouth daily., Disp: 45 tablet, Rfl: 3 .  losartan-hydrochlorothiazide (HYZAAR) 100-12.5 MG tablet, TAKE 1 TABLET BY MOUTH DAILY (Patient taking differently: Take 1 tablet by mouth daily with supper. TAKE 1 TABLET BY MOUTH DAIL), Disp: 90 tablet, Rfl: 3 .  metoprolol tartrate (LOPRESSOR) 25 MG tablet, Take 1 tablet (25 mg total) by mouth 2 (two) times daily., Disp: 60 tablet, Rfl: 6 .  Multiple Vitamin  (MULTIVITAMIN WITH MINERALS) TABS tablet, Take 1 tablet by mouth daily. Reported on 01/03/2016, Disp: , Rfl:  .  nitroGLYCERIN (NITROSTAT) 0.4 MG SL tablet, Place 1 tablet (0.4 mg total) under the tongue every 5 (five) minutes x 3 doses as needed for chest pain., Disp: 25 tablet, Rfl: 3 .  PRESCRIPTION MEDICATION, Inhale into the lungs at bedtime. CPAP, Disp: , Rfl:  .  VICTOZA 18 MG/3ML SOPN, START WITH 0.6 MG INJECTED [place] DAILY FOR ONE WEEK, THEN INCREASE TO 1.2 MG DAILY, Disp: 6 mL, Rfl: 3  Past Medical History: Past Medical History:  Diagnosis Date  . Arthritis   . CAD (coronary artery disease)    a. 11/2016 NSTEMI/Cath: LM nl, LAD 40p, 55md, D1/2 small, LCX 40ost, OM2/3 nl/small, RCA 35 ost/mid, RPDA/RPL small/nl.  . Clotting disorder (HCC)    blood clot in eye 2016  . Diabetes mellitus without complication (HCC)   . Diastolic dysfunction    a. 11/2016 Echo: EF 55-60%, gr1 DD, sev calcified MV annulus, mildly dil LA.  [facility] Kitchen Eye hemorrhage 4/16   resolved rt  . Hyperlipidemia   . Hypertension   . Microcytic anemia   . Sleep apnea    use CPAP  . Thalassemia   . Ulcer (HCC)     Tobacco Use: History  Smoking Status  . Former Smoker  . Packs/day: 0.25  . Years: 44.00  . Types: Cigarettes  .  Quit date: 02/17/2015  Smokeless Tobacco  . Never Used    Labs: Recent Review Flowsheet Data    Labs for ITP Cardiac and Pulmonary Rehab Latest Ref Rng & Units 07/13/2015 01/03/2016 05/14/2016 09/13/2016 11/17/2016   Cholestrol 0 - 200 mg/dL 889(X) 135(B) - - 670   LDLCALC 0 - 99 mg/dL 393(X) 844 - - 99   LDLDIRECT mg/dL - - - - -   HDL >48 mg/dL 50 50 - - 81(F)   Trlycerides <150 mg/dL 128 880 - - 010(T)   Hemoglobin A1c 4.8 - 5.6 % 6.6(H) 7.6 9.3(H) 8.5(H) 7.7(H)      Capillary Blood Glucose: Lab Results  Component Value Date   GLUCAP 227 (H) 11/20/2016   GLUCAP 147 (H) 11/20/2016   GLUCAP 193 (H) 11/20/2016   GLUCAP 188 (H) 11/19/2016   GLUCAP 131 (H) 11/19/2016     Exercise  Target Goals: Date: 01/22/17  Exercise Program Goal: Individual exercise prescription set with THRR, safety & activity barriers. Participant demonstrates ability to understand and report RPE using BORG scale, to self-measure pulse accurately, and to acknowledge the importance of the exercise prescription.  Exercise Prescription Goal: Starting with aerobic activity 30 plus minutes a day, 3 days per week for initial exercise prescription. Provide home exercise prescription and guidelines that participant acknowledges understanding prior to discharge.  Activity Barriers & Risk Stratification:     Activity Barriers & Cardiac Risk Stratification - 01/22/17 0840      Activity Barriers & Cardiac Risk Stratification   Activity Barriers Left Knee Replacement;Right Knee Replacement;Deconditioning;Muscular Weakness;Back Problems   Cardiac Risk Stratification High      6 Minute Walk:     6 Minute Walk    Row Name 01/22/17 1121         6 Minute Walk   Phase Initial     Distance 899 feet     Walk Time 4.3 minutes     # of Rest Breaks 1     MPH 2.38     METS 1.94     RPE 13     VO2 Peak 6.78     Symptoms Yes (comment)     Comments Pt c/o dizziness after 2 laps. BP at that time was 144/80. Symptoms resolved after rest break and pt wished to continue walk test.     Resting HR 84 bpm     Resting BP 118/68     Max Ex. HR 110 bpm     Max Ex. BP 138/82     2 Minute Post BP 120/60        Oxygen Initial Assessment:   Oxygen Re-Evaluation:   Oxygen Discharge (Final Oxygen Re-Evaluation):   Initial Exercise Prescription:     Initial Exercise Prescription - 01/22/17 1200      Date of Initial Exercise RX and Referring Provider   Date 01/22/17   Referring Provider Bryan Lemma, MD.     Recumbant Bike   Level 1   Watts 15   Minutes 10   METs 2.44     NuStep   Level 1   SPM 85   Minutes 10   METs 2.4     Arm Ergometer   Level 1   Watts 20   Minutes 10   METs 1.98      Track   Laps 8   Minutes 10   METs 2.39     Prescription Details   Frequency (times per week) 3   Duration Progress  to 30 minutes of continuous aerobic without signs/symptoms of physical distress     Intensity   THRR 40-80% of Max Heartrate 63-126   Ratings of Perceived Exertion 11-13   Perceived Dyspnea 0-4     Progression   Progression Continue to progress workloads to maintain intensity without signs/symptoms of physical distress.     Resistance Training   Training Prescription Yes   Weight 1lb.   Reps 10-15      Perform Capillary Blood Glucose checks as needed.  Exercise Prescription Changes:   Exercise Comments:   Exercise Goals and Review:     Exercise Goals    Row Name 01/22/17 0841             Exercise Goals   Increase Physical Activity Yes       Intervention Provide advice, education, support and counseling about physical activity/exercise needs.;Develop an individualized exercise prescription for aerobic and resistive training based on initial evaluation findings, risk stratification, comorbidities and participant's personal goals.       Expected Outcomes Achievement of increased cardiorespiratory fitness and enhanced flexibility, muscular endurance and strength shown through measurements of functional capacity and personal statement of participant.       Increase Strength and Stamina Yes       Intervention Provide advice, education, support and counseling about physical activity/exercise needs.;Develop an individualized exercise prescription for aerobic and resistive training based on initial evaluation findings, risk stratification, comorbidities and participant's personal goals.       Expected Outcomes Achievement of increased cardiorespiratory fitness and enhanced flexibility, muscular endurance and strength shown through measurements of functional capacity and personal statement of participant.          Exercise Goals Re-Evaluation  :    Discharge Exercise Prescription (Final Exercise Prescription Changes):   Nutrition:  Target Goals: Understanding of nutrition guidelines, daily intake of sodium '1500mg'$ , cholesterol '200mg'$ , calories 30% from fat and 7% or less from saturated fats, daily to have 5 or more servings of fruits and vegetables.  Biometrics:     Pre Biometrics - 01/22/17 1121      Pre Biometrics   Height 5' 3.25" (1.607 m)   Weight 236 lb 12.4 oz (107.4 kg)   Waist Circumference 47 inches   Hip Circumference 49 inches   Waist to Hip Ratio 0.96 %   BMI (Calculated) 41.7   Triceps Skinfold 68 mm   % Body Fat 55.5 %   Grip Strength 30 kg   Flexibility 9.75 in   Single Leg Stand 9.56 seconds       Nutrition Therapy Plan and Nutrition Goals:   Nutrition Discharge: Nutrition Scores:   Nutrition Goals Re-Evaluation:   Nutrition Goals Re-Evaluation:   Nutrition Goals Discharge (Final Nutrition Goals Re-Evaluation):   Psychosocial: Target Goals: Acknowledge presence or absence of significant depression and/or stress, maximize coping skills, provide positive support system. Participant is able to verbalize types and ability to use techniques and skills needed for reducing stress and depression.  Initial Review & Psychosocial Screening:     Initial Psych Review & Screening - 01/22/17 [place]? Yes     Barriers   Psychosocial barriers to participate in program There are no identifiable barriers or psychosocial needs.     Screening Interventions   Interventions Encouraged to exercise      Quality of Life Scores:     Quality of Life - 01/22/17 9678  Quality of Life Scores   Health/Function Pre 19.97 %   Socioeconomic Pre 23.14 %   Psych/Spiritual Pre 25.71 %   Family Pre 23 %   GLOBAL Pre 22.23 %      PHQ-9: Recent Review Flowsheet Data    Depression screen [facility] 2/9 01/03/2016 07/13/2015 03/07/2015   Decreased Interest 0 0 0    Down, Depressed, Hopeless 0 0 0   PHQ - 2 Score 0 0 0     Interpretation of Total Score  Total Score Depression Severity:  1-4 = Minimal depression, 5-9 = Mild depression, 10-14 = Moderate depression, 15-19 = Moderately severe depression, 20-27 = Severe depression   Psychosocial Evaluation and Intervention:   Psychosocial Re-Evaluation:   Psychosocial Discharge (Final Psychosocial Re-Evaluation):   Vocational Rehabilitation: Provide vocational rehab assistance to qualifying candidates.   Vocational Rehab Evaluation & Intervention:     Vocational Rehab - 01/22/17 1655      Initial Vocational Rehab Evaluation & Intervention   Assessment shows need for Vocational Rehabilitation No      Education: Education Goals: Education classes will be provided on a weekly basis, covering required topics. Participant will state understanding/return demonstration of topics presented.  Learning Barriers/Preferences:     Learning Barriers/Preferences - 01/22/17 0840      Learning Barriers/Preferences   Learning Barriers Sight   Learning Preferences Written Material;Verbal Instruction      Education Topics: Count Your Pulse:  -Group instruction provided by verbal instruction, demonstration, patient participation and written materials to support subject.  Instructors address importance of being able to find your pulse and how to count your pulse when at home without a heart monitor.  Patients get hands on experience counting their pulse with staff help and individually.   Heart Attack, Angina, and Risk Factor Modification:  -Group instruction provided by verbal instruction, video, and written materials to support subject.  Instructors address signs and symptoms of angina and heart attacks.    Also discuss risk factors for heart disease and how to make changes to improve heart health risk factors.   Functional Fitness:  -Group instruction provided by verbal instruction, demonstration,  patient participation, and written materials to support subject.  Instructors address safety measures for doing things around the house.  Discuss how to get up and down off the floor, how to pick things up properly, how to safely get out of a chair without assistance, and balance training.   Meditation and Mindfulness:  -Group instruction provided by verbal instruction, patient participation, and written materials to support subject.  Instructor addresses importance of mindfulness and meditation practice to help reduce stress and improve awareness.  Instructor also leads participants through a meditation exercise.    Stretching for Flexibility and Mobility:  -Group instruction provided by verbal instruction, patient participation, and written materials to support subject.  Instructors lead participants through series of stretches that are designed to increase flexibility thus improving mobility.  These stretches are additional exercise for major muscle groups that are typically performed during regular warm up and cool down.   Hands Only CPR Anytime:  -Group instruction provided by verbal instruction, video, patient participation and written materials to support subject.  Instructors co-teach with AHA video for hands only CPR.  Participants get hands on experience with mannequins.   Nutrition I class: Heart Healthy Eating:  -Group instruction provided by PowerPoint slides, verbal discussion, and written materials to support subject matter. The instructor gives an explanation and review of the Therapeutic Lifestyle Changes  diet recommendations, which includes a discussion on lipid goals, dietary fat, sodium, fiber, plant stanol/sterol esters, sugar, and the components of a well-balanced, healthy diet.   Nutrition II class: Lifestyle Skills:  -Group instruction provided by PowerPoint slides, verbal discussion, and written materials to support subject matter. The instructor gives an explanation and  review of label reading, grocery shopping for heart health, heart healthy recipe modifications, and ways to make healthier choices when eating out.   Diabetes Question & Answer:  -Group instruction provided by PowerPoint slides, verbal discussion, and written materials to support subject matter. The instructor gives an explanation and review of diabetes co-morbidities, pre- and post-prandial blood glucose goals, pre-exercise blood glucose goals, signs, symptoms, and treatment of hypoglycemia and hyperglycemia, and foot care basics.   Diabetes Blitz:  -Group instruction provided by PowerPoint slides, verbal discussion, and written materials to support subject matter. The instructor gives an explanation and review of the physiology behind type 1 and type 2 diabetes, diabetes medications and rational behind using different medications, pre- and post-prandial blood glucose recommendations and Hemoglobin A1c goals, diabetes diet, and exercise including blood glucose guidelines for exercising safely.    Portion Distortion:  -Group instruction provided by PowerPoint slides, verbal discussion, written materials, and food models to support subject matter. The instructor gives an explanation of serving size versus portion size, changes in portions sizes over the last 20 years, and what consists of a serving from each food group.   Stress Management:  -Group instruction provided by verbal instruction, video, and written materials to support subject matter.  Instructors review role of stress in heart disease and how to cope with stress positively.     Exercising on Your Own:  -Group instruction provided by verbal instruction, power point, and written materials to support subject.  Instructors discuss benefits of exercise, components of exercise, frequency and intensity of exercise, and end points for exercise.  Also discuss use of nitroglycerin and activating EMS.  Review options of places to exercise  outside of rehab.  Review guidelines for sex with heart disease.   Cardiac Drugs I:  -Group instruction provided by verbal instruction and written materials to support subject.  Instructor reviews cardiac drug classes: antiplatelets, anticoagulants, beta blockers, and statins.  Instructor discusses reasons, side effects, and lifestyle considerations for each drug class.   Cardiac Drugs II:  -Group instruction provided by verbal instruction and written materials to support subject.  Instructor reviews cardiac drug classes: angiotensin converting enzyme inhibitors (ACE-I), angiotensin II receptor blockers (ARBs), nitrates, and calcium channel blockers.  Instructor discusses reasons, side effects, and lifestyle considerations for each drug class.   Anatomy and Physiology of the Circulatory System:  -Group instruction provided by verbal instruction, video, and written materials to support subject.  Reviews functional anatomy of heart, how it relates to various diagnoses, and what role the heart plays in the overall system.   Knowledge Questionnaire Score:     Knowledge Questionnaire Score - 01/22/17 0904      Knowledge Questionnaire Score   Pre Score 18/24      Core Components/Risk Factors/Patient Goals at Admission:     Personal Goals and Risk Factors at Admission - 01/22/17 0842      Core Components/Risk Factors/Patient Goals on Admission    Weight Management Yes;Obesity;Weight Loss;Weight Maintenance   Intervention Weight Management: Develop a combined nutrition and exercise program designed to reach desired caloric intake, while maintaining appropriate intake of nutrient and fiber, sodium and fats, and appropriate energy expenditure  required for the weight goal.;Weight Management: Provide education and appropriate resources to help participant work on and attain dietary goals.;Weight Management/Obesity: Establish reasonable short term and long term weight goals.;Obesity: Provide  education and appropriate resources to help participant work on and attain dietary goals.   Expected Outcomes Short Term: Continue to assess and modify interventions until short term weight is achieved;Long Term: Adherence to nutrition and physical activity/exercise program aimed toward attainment of established weight goal;Weight Maintenance: Understanding of the daily nutrition guidelines, which includes 25-35% calories from fat, 7% or less cal from saturated fats, less than '200mg'$  cholesterol, less than 1.5gm of sodium, & 5 or more servings of fruits and vegetables daily;Weight Loss: Understanding of general recommendations for a balanced deficit meal plan, which promotes 1-2 lb weight loss per week and includes a negative energy balance of [phone] kcal/d;Understanding recommendations for meals to include 15-35% energy as protein, 25-35% energy from fat, 35-60% energy from carbohydrates, less than '200mg'$  of dietary cholesterol, 20-35 gm of total fiber daily;Understanding of distribution of calorie intake throughout the day with the consumption of 4-5 meals/snacks   Diabetes Yes   Intervention Provide education about signs/symptoms and action to take for hypo/hyperglycemia.;Provide education about proper nutrition, including hydration, and aerobic/resistive exercise prescription along with prescribed medications to achieve blood glucose in normal ranges: Fasting glucose 65-99 mg/dL   Expected Outcomes Short Term: Participant verbalizes understanding of the signs/symptoms and immediate care of hyper/hypoglycemia, proper foot care and importance of medication, aerobic/resistive exercise and nutrition plan for blood glucose control.;Long Term: Attainment of HbA1C < 7%.   Hypertension Yes   Intervention Provide education on lifestyle modifcations including regular physical activity/exercise, weight management, moderate sodium restriction and increased consumption of fresh fruit, vegetables, and low fat dairy,  alcohol moderation, and smoking cessation.;Monitor prescription use compliance.   Expected Outcomes Short Term: Continued assessment and intervention until BP is < 140/59m HG in hypertensive participants. < 130/861mHG in hypertensive participants with diabetes, heart failure or chronic kidney disease.;Long Term: Maintenance of blood pressure at goal levels.   Lipids Yes   Intervention Provide education and support for participant on nutrition & aerobic/resistive exercise along with prescribed medications to achieve LDL '70mg'$ , HDL >'40mg'$ .   Expected Outcomes Short Term: Participant states understanding of desired cholesterol values and is compliant with medications prescribed. Participant is following exercise prescription and nutrition guidelines.;Long Term: Cholesterol controlled with medications as prescribed, with individualized exercise RX and with personalized nutrition plan. Value goals: LDL < '70mg'$ , HDL > 40 mg.   Personal Goal Other Yes   Personal Goal Increase knowledge on diabetes and enjoy life   Intervention Provide nutrition counseling and diabetes education to aid in increasing awareness/understanding on diabetes management   Expected Outcomes Pt will have increased knowledge on diabetes and improve overall quality of life.      Core Components/Risk Factors/Patient Goals Review:    Core Components/Risk Factors/Patient Goals at Discharge (Final Review):    ITP Comments:     ITP Comments    Row Name 01/22/17 0826           ITP Comments Medical Director:  Dr. TrFransico Him         Comments: PaJosselinttended orientation from 0800  to 1000 to review rules and guidelines for program. Completed 6 minute walk test, Intitial ITP, and exercise prescription.  VSS. Telemetry-Sinus Rhythm . PaIshiaomplained of feeling lightheaded dizzy  After walking 2 laps during her walk test. Blood pressure 144/80. No arrhythmias  noted. Anayah was able to complete her walk test without  further complaints or symptoms. Chanetta's symptoms resolved after about 30 seconds. Dr Ellyn Hack and Dr Lillie Fragmin office called and notified about today's events. Will fax exercise flow sheets to Dr. Allison Quarry office for review.[name], RN,BSN 01/22/2017 5:29 PM

## 2017-01-22 NOTE — Telephone Encounter (Signed)
Lynn [name] with Cardiac Rehab called about hemoglobin level - She is going to notify cardiology. Pt is ok and walking around. Pt was weak and dizzy with walk test but was fine before leaving. She said previously pt had low hemoglobin. She is going to call cardiology as well to f/u about ordering hemoglobin check. Please f/u with the pt as she'll need at [facility] recheck  Phone# [phone]

## 2017-01-22 NOTE — Telephone Encounter (Signed)
Called patient She states every now and then she experiences lightheadedness/dizziness She states she gets like this if her sugar is not right Patient is feeling OK Advised that she monitor her symptoms and checked BP/HR and blood sugar when/if episodes occur to see if she can notice a trend.   She agreed w/plan and voiced understanding.   Will route to MD as [place], RN also made a call to PCP office requesting a CBC be checked to reassess Hgb

## 2017-01-23 ENCOUNTER — Encounter: Payer: Self-pay | Admitting: Family Medicine

## 2017-01-29 ENCOUNTER — Ambulatory Visit (HOSPITAL_COMMUNITY): Payer: BLUE CROSS/BLUE SHIELD

## 2017-01-30 ENCOUNTER — Encounter (HOSPITAL_COMMUNITY)
Admission: RE | Admit: 2017-01-30 | Discharge: 2017-01-30 | Disposition: A | Discharge status: Home / Self Care | Payer: BLUE CROSS/BLUE SHIELD | Source: Ambulatory Visit | Attending: Cardiology | Admitting: Cardiology

## 2017-01-30 DIAGNOSIS — I214 Non-ST elevation (NSTEMI) myocardial infarction: Secondary | ICD-10-CM | POA: Diagnosis not present

## 2017-01-30 LAB — GLUCOSE, CAPILLARY
Glucose-Capillary: 236 mg/dL — ABNORMAL HIGH (ref 65–99)
Glucose-Capillary: 183 mg/dL — ABNORMAL HIGH (ref 65–99)

## 2017-01-30 NOTE — Progress Notes (Signed)
Daily Session Note  Patient Details  Name: Lynn [name] MRN: [id number] Date of Birth: 05/21/1956 Referring Provider:     CARDIAC REHAB PHASE II ORIENTATION from 01/22/2017 in [place]  Referring Provider  Glenetta Hew, MD.      Encounter Date: 01/30/2017  Check In:     Session Check In - 01/30/17 1105      Check-In   Location [facility]   Staff Present Cleda Mccreedy, MS, Exercise Physiologist;Amber Fair, MS, ACSM RCEP, Exercise Physiologist;Maria Whitaker, RN, BSN;Carlette Carlton, RN, BSN;Joann Rion, RN, BSN   Supervising physician immediately available to respond to emergencies [facility] immediately available   Physician(s) Dr. Candiss Norse   Medication changes reported     No   Fall or balance concerns reported    No   Tobacco Cessation No Change   Warm-up and Cool-down Performed as group-led instruction   Resistance Training Performed No   VAD Patient? No     Pain Assessment   Currently in Pain? No/denies   Multiple Pain Sites No      Capillary Blood Glucose: Results for orders placed or performed during the hospital encounter of 01/30/17 (from the past 24 hour(s))  Glucose, capillary     Status: Abnormal   Collection Time: 01/30/17  9:53 AM  Result Value Ref Range   Glucose-Capillary 236 (H) 65 - 99 mg/dL  Glucose, capillary     Status: Abnormal   Collection Time: 01/30/17 10:48 AM  Result Value Ref Range   Glucose-Capillary 183 (H) 65 - 99 mg/dL      History  Smoking Status  . Former Smoker  . Packs/day: 0.25  . Years: 44.00  . Types: Cigarettes  . Quit date: 02/17/2015  Smokeless Tobacco  . Never Used    Goals Met:  Exercise tolerated well  Goals Unmet:  Not Applicable  Comments: [name] started cardiac rehab today per Dr Ellyn Hack.  Pt tolerated light exercise without difficulty. VSS, telemetry-Sinus Rhythm , asymptomatic.  Medication list reconciled. Pt denies barriers to medicaiton  compliance.  PSYCHOSOCIAL ASSESSMENT:  PHQ-0. Pt exhibits positive coping skills, hopeful outlook with supportive family. No psychosocial needs identified at this time, no psychosocial interventions necessary.    Pt enjoys baking, painting, fishing and spending time with her grandchildren..   Pt oriented to exercise equipment and routine.  [name] had no complaints of dizziness with exercise today.  Understanding verbalized. [name], RN,BSN 01/30/2017 5:29 PM   Dr. Fransico Him is Medical Director for Cardiac Rehab at [facility].

## 2017-01-31 ENCOUNTER — Ambulatory Visit (INDEPENDENT_AMBULATORY_CARE_PROVIDER_SITE_OTHER): Payer: BLUE CROSS/BLUE SHIELD | Admitting: Family Medicine

## 2017-01-31 VITALS — BP 130/72 | HR 73 | Temp 98.9°F | Ht 60.0 in | Wt 237.8 lb

## 2017-01-31 DIAGNOSIS — E669 Obesity, unspecified: Secondary | ICD-10-CM | POA: Diagnosis not present

## 2017-01-31 DIAGNOSIS — I10 Essential (primary) hypertension: Secondary | ICD-10-CM

## 2017-01-31 DIAGNOSIS — IMO0001 Reserved for inherently not codable concepts without codable children: Secondary | ICD-10-CM

## 2017-01-31 DIAGNOSIS — Z862 Personal history of diseases of the blood and blood-forming organs and certain disorders involving the immune mechanism: Secondary | ICD-10-CM | POA: Diagnosis not present

## 2017-01-31 DIAGNOSIS — D509 Iron deficiency anemia, unspecified: Secondary | ICD-10-CM

## 2017-01-31 LAB — CBC
HCT: 30.9 % — ABNORMAL LOW (ref 36.0–46.0)
Hemoglobin: 9.6 g/dL — ABNORMAL LOW (ref 12.0–15.0)
MCHC: 31 g/dL (ref 30.0–36.0)
MCV: 59 fl — ABNORMAL LOW (ref 78.0–100.0)
Platelets: 299 10*3/uL (ref 150.0–400.0)
RBC: 5.24 Mil/uL — ABNORMAL HIGH (ref 3.87–5.11)
RDW: 17.3 % — ABNORMAL HIGH (ref 11.5–15.5)
WBC: 8.2 10*3/uL (ref 4.0–10.5)

## 2017-01-31 LAB — FERRITIN: Ferritin: 45.8 ng/mL (ref 10.0–291.0)

## 2017-01-31 NOTE — Patient Instructions (Addendum)
It was very nice to see you today!   I will be in touch with your labs asap and we will plan the next step Please ask your eye doctor to send me a report after your next visit

## 2017-01-31 NOTE — Progress Notes (Signed)
[place] [facility] [address] [place], [place] [address] [phone]  Date:  01/31/2017   Name:  Lynn [name]   DOB:  1956-09-17   MRN:  [id number]  PCP:  Lamar Blinks, MD    Chief Complaint: Follow-up (Pt states that while she was at cardiac rehab on 01/22/17  pt had episode of dizziness,  On 01/25/17 pt states that she had another episode of dizziness. Cardiac rehab asked pt to follow up with pcp due to possible iron def)   History of Present Illness:  Lynn [name] is a 61 y.o. very pleasant female patient who presents with the following:  Here today to follow-up on DM, HTN and NSTEMI. I last saw her in February with the following HPI:  Admitted to the [facility] on unit 3W  11-16-2016 to 11-20-2016 with NSTEMI.  She came in from home with CP to the ER, was admitted and had a cath which was virtually negative- no stents needed  Saw cardiology Sabino Niemann, NP) on 12-04-2016.  Next appointment is in 6 weeks. She is using a BB, aspirin, lipitor, glipizide, imdur, hyzaar, metformin, lopressor, nitro prn  Blood sugar "went crazy" after she was discharged from the hospital.  Now taking her metformin, glipizide and 1.2 of Victoza.  CBGs are now 140-160. However too soon to repeat an A1c now  Has had to use almost a whole bottle of nitro after her hospital DC due to chest pain. Cardiology started her on imdur and her CP is virtually resolved now, she no longer needs her nitro Interested in weight loss surgery.  Wants to loose weight one way or another.  Cardiology said that she was OK to exercise and she will be participating in cardiac rehab soon  DM: using 1.2 mg Victoza & Glipizide, '5mg'$  daily.    Lab Results  Component Value Date   HGBA1C 7.7 (H) 11/17/2016    HTN: on losartan-hydrochlorothiazide 100-12.5 MG tablet and Metoprolol, 25 mg.  This month she started going to cardiac rehab, with last visit on  01-30-2017.  During cardiac rehab on 01-22-2017, patient complained of feeling lightheaded and dizzy, noted to have high BP (SBP approx 170).  Patient has a history of low Hgb.  Cardiology notified, so that they could order a CBC to check Hgb. She also had an episode of dizziness this past Friday while at home. Of notes she does have thalassemia dx in 2014  She notes that the episodes above might last up to 15 minutes or even a couple of hours. Can be triggered by stress or even when she is feeling relaxed   She has not really had vertigo- more of a lightheaded feeling.  She also may have on occasional "short burst of headache," that might last 5- 10 minutes and then resolve.  These headaches have been occurring for about one month.  Can occur a few times a day  No fainting.  Her weight is stable, she is trying to eat better.    colonoscopy 2017 s/p hysterectomy  She plans to do her eye exam soon   Lab Results  Component Value Date   WBC 8.4 11/20/2016   HGB 8.7 (L) 11/20/2016   HCT 23.5 (L) 11/20/2016   MCV 61.7 (L) 11/20/2016   PLT 235 11/20/2016   BP Readings from Last 3 Encounters:  01/31/17 130/72  01/22/17 118/68  12/27/16 122/76   Wt Readings from Last 3  Encounters:  01/31/17 237 lb 12.8 oz (107.9 kg)  01/22/17 236 lb 12.4 oz (107.4 kg)  12/27/16 238 lb (108 kg)     Patient Active Problem List   Diagnosis Date Noted  . CAD in native artery   . SVT (supraventricular tachycardia) ([place])   . Non-STEMI (non-ST elevated myocardial infarction) ([place]) 11/16/2016  . Chest pain 11/16/2016  . H/O total knee replacement 08/19/2015  . Vision, loss, sudden 03/07/2015  . S/P TKR (total knee replacement) using cement 02/18/2015  . Thalassemia 09/02/2013  . Hyperlipidemia LDL goal < 100 08/24/2012  . Diabetes mellitus ([place]) 07/22/2012  . Hypertension 07/22/2012    Past Medical History:  Diagnosis Date  . Arthritis   . CAD (coronary artery disease)    a. 11/2016 NSTEMI/Cath: LM  nl, LAD 40p, 59m, D1/2 small, LCX 40ost, OM2/3 nl/small, RCA 35 ost/mid, RPDA/RPL small/nl.  . [place]otting disorder [place]    blood clot in eye 2016  . Diabetes mellitus without complication ([place]   . Diastolic dysfunction    a. 11/2016 Echo: EF 55-60%, gr1 DD, sev calcified MV annulus, mildly dil LA.  .Marland KitchenEye hemorrhage 4/16   resolved rt  . Hyperlipidemia   . Hypertension   . Microcytic anemia   . Sleep apnea    use CPAP  . Thalassemia   . Ulcer     Past Surgical History:  Procedure Laterality Date  . ABDOMINAL HYSTERECTOMY    . arthroscopic knee Left 2-16  . CARDIAC CATHETERIZATION N/A 11/19/2016   Procedure: Left Heart Cath and Coronary Angiography;  Surgeon: HBelva Crome MD;  [facility];  Service: Cardiovascular;  Laterality: N/A;  . CARPAL TUNNEL RELEASE Right 11/01/2014  . COLONOSCOPY    . JOINT REPLACEMENT    . TONSILLECTOMY    . TOTAL KNEE ARTHROPLASTY Right 02/18/2015   Procedure: RIGHT TOTAL KNEE ARTHROPLASTY;  Surgeon: SNetta Cedars MD;  [facility]  Service: Orthopedics;  Laterality: Right;  . TOTAL KNEE ARTHROPLASTY Left 08/19/2015   Procedure: LEFT TOTAL KNEE ARTHROPLASTY;  Surgeon: SNetta Cedars MD;  [facility]  Service: Orthopedics;  Laterality: Left;    Social History  Substance Use Topics  . Smoking status: Former Smoker    Packs/day: 0.25    Years: 44.00    Types: Cigarettes    Quit date: 02/17/2015  . Smokeless tobacco: Never Used  . Alcohol use 0.0 oz/week     Comment: socially    Family History  Problem Relation Age of Onset  . Cancer Mother   . Diabetes Mother   . Hypertension Mother   . Hyperlipidemia Mother   . Cancer Father   . Hyperlipidemia Father   . Mental illness Sister   . Diabetes Sister   . Diabetes Maternal Grandmother   . Diabetes Maternal Grandfather   . Colon cancer Neg Hx   . Esophageal cancer Neg Hx   . Stomach cancer Neg Hx   . Rectal cancer Neg Hx     Allergies  Allergen Reactions  .  Penicillins Hives    Has patient had a PCN reaction causing immediate rash, facial/tongue/throat swelling, SOB or lightheadedness with hypotension: Yes Has patient had a PCN reaction causing severe rash involving mucus membranes or skin necrosis: No Has patient had a PCN reaction that required hospitalization No Has patient had a PCN reaction occurring within the last 10 years: No If all of the above answers are "NO", then may proceed with Cephalosporin use.  Medication list has been reviewed and updated.  Current Outpatient Prescriptions on File Prior to Visit  Medication Sig Dispense Refill  . aspirin 81 MG chewable tablet Chew 1 tablet (81 mg total) by mouth daily. 30 tablet   . atorvastatin (LIPITOR) 80 MG tablet Take 1 tablet (80 mg total) by mouth daily at 6 PM. 30 tablet 6  . blood glucose meter kit and supplies KIT Use up to four times daily as directed.Dx: E11.9 1 each 0  . Blood Glucose Monitoring Suppl (ACCU-CHEK AVIVA PLUS) W/DEVICE KIT     . glipiZIDE (GLUCOTROL) 5 MG tablet Take 1 tablet (5 mg total) by mouth 2 (two) times daily before a meal. 180 tablet 3  . glucose blood test strip Use up to four times daily as directly. Dx E11.9 400 each 3  . Insulin Pen Needle (PEN NEEDLES) 31G X 5 MM MISC 1 Units by Does not apply route daily. For use with victoza pen 100 each 3  . isosorbide mononitrate (IMDUR) 30 MG 24 hr tablet Take 0.5 tablets (15 mg total) by mouth daily. 45 tablet 3  . losartan-hydrochlorothiazide (HYZAAR) 100-12.5 MG tablet TAKE 1 TABLET BY MOUTH DAILY (Patient taking differently: Take 1 tablet by mouth daily with supper. TAKE 1 TABLET BY MOUTH DAIL) 90 tablet 3  . metoprolol tartrate (LOPRESSOR) 25 MG tablet Take 1 tablet (25 mg total) by mouth 2 (two) times daily. 60 tablet 6  . Multiple Vitamin (MULTIVITAMIN WITH MINERALS) TABS tablet Take 1 tablet by mouth daily. Reported on 01/03/2016    . nitroGLYCERIN (NITROSTAT) 0.4 MG SL tablet Place 1 tablet (0.4 mg total)  under the tongue every 5 (five) minutes x 3 doses as needed for chest pain. 25 tablet 3  . PRESCRIPTION MEDICATION Inhale into the lungs at bedtime. CPAP    . VICTOZA 18 MG/3ML SOPN START WITH 0.6 MG INJECTED [place] DAILY FOR ONE WEEK, THEN INCREASE TO 1.2 MG DAILY 6 mL 3   No current facility-administered medications on file prior to visit.     Review of Systems:  As per HPI- otherwise negative.   Physical Examination: Vitals:   01/31/17 1014  BP: 130/72  Pulse: 73  Temp: 98.9 F (37.2 C)   Vitals:   01/31/17 1014  Weight: 237 lb 12.8 oz (107.9 kg)  Height: 5' (1.524 m)   Body mass index is 46.44 kg/m. Ideal Body Weight: Weight in (lb) to have BMI = 25: 127.7  GEN: WDWN, NAD, Non-toxic, A & O x 3 HEENT: Atraumatic, Normocephalic. Neck supple. No masses, No LAD.  Bilateral TM wnl, oropharynx normal.  PEERL,EOMI.   Ears and Nose: No external deformity. CV: RRR, No M/G/R. No JVD. No thrill. No extra heart sounds. PULM: CTA B, no wheezes, crackles, rhonchi. No retractions. No resp. distress. No accessory muscle use. ABD: S, NT, ND, +BS. No rebound. No HSM. EXTR: No c/c/e NEURO Normal gait.  PSYCH: Normally interactive. Conversant. Not depressed or anxious appearing.  Calm demeanor.  Obese, otherwise looks well and her normal self   Assessment and Plan:  Microcytic anemia - Plan: Ferritin, Pathologist smear review, CANCELED: Hemoglobinopathy evaluation, CANCELED: Smear Review per Protocol  Essential hypertension - Plan: Pathologist smear review  Class 3 obesity with serious comorbidity in adult, unspecified BMI, unspecified obesity type - Plan: Pathologist smear review  History of anemia - Plan: CBC, Pathologist smear review  History of obesity, DM, HTN Here today because she has experienced some dizziness at cardiac rehab and  they wanted to make sure she is not iron deficient. She has thalassemia which is the likely cause of of her anemia.   Will obtain labs as above, and  may plan to have her see hematology  Her weight is stable   Signed Lamar Blinks, MD

## 2017-02-01 ENCOUNTER — Encounter (HOSPITAL_COMMUNITY)
Admission: RE | Admit: 2017-02-01 | Discharge: 2017-02-01 | Disposition: A | Discharge status: Home / Self Care | Payer: BLUE CROSS/BLUE SHIELD | Source: Ambulatory Visit | Attending: Cardiology | Admitting: Cardiology

## 2017-02-01 DIAGNOSIS — I214 Non-ST elevation (NSTEMI) myocardial infarction: Secondary | ICD-10-CM | POA: Diagnosis not present

## 2017-02-01 LAB — GLUCOSE, CAPILLARY
Glucose-Capillary: 256 mg/dL — ABNORMAL HIGH (ref 65–99)
Glucose-Capillary: 197 mg/dL — ABNORMAL HIGH (ref 65–99)

## 2017-02-01 LAB — PATHOLOGIST SMEAR REVIEW

## 2017-02-01 NOTE — Progress Notes (Signed)
Lynn [name] 61 y.o. female Nutrition Note Spoke with pt. Nutrition Plan and Nutrition Survey goals reviewed with pt. Pt is following Step 2 of the Therapeutic Lifestyle Changes diet. Pt wants to lose wt. Pt has been working on wt loss by cutting back of portion sizes consumed. Pt has decreased bread consumption "because I love bread." Wt loss tips briefly reviewed. Pt is diabetic. Last A1c indicates blood glucose not optimally controlled. Pt returned her Diabetes Education test incomplete. Pt checks CBG's 3-4 times a day. Fasting CBG's reportedly 150-170 mg/dL. Pt with dx of CHF. Per discussion, pt uses canned cream of chicken soup frequently. Pt watches sodium on food labels. Pt expressed understanding of the information reviewed. Pt aware of nutrition education classes offered.  Lab Results  Component Value Date   HGBA1C 7.7 (H) 11/17/2016   Wt Readings from Last 3 Encounters:  01/31/17 237 lb 12.8 oz (107.9 kg)  01/22/17 236 lb 12.4 oz (107.4 kg)  12/27/16 238 lb (108 kg)    Nutrition Diagnosis ? Food-and nutrition-related knowledge deficit related to lack of exposure to information as related to diagnosis of: ? CVD ? DM ? Obesity related to excessive energy intake as evidenced by a BMI of 41.7  Nutrition Intervention ? Pt's individual nutrition plan reviewed with pt. ? Benefits of adopting Therapeutic Lifestyle Changes discussed when Medficts reviewed. ? Pt to attend the Portion Distortion class ? Pt to attend the Diabetes Q & A class ? Pt to attend the   ? Nutrition I class                  ? Nutrition II class     ? Diabetes Blitz class ? Pt given handouts for: ? Nutrition I class ? Nutrition II class ? Diabetes Blitz Class ? Continue client-centered nutrition education by RD, as part of interdisciplinary care. Goal(s) ? Pt to identify food quantities necessary to achieve weight loss of 6-24 lb (2.7-10.9 kg) at graduation from cardiac rehab.  ? CBG concentrations in the  normal range or as close to normal as is safely possible. Monitor and Evaluate progress toward nutrition goal with team. Derek Mound, M.Ed, RD, LDN, CDE 02/01/2017 11:01 AM

## 2017-02-04 ENCOUNTER — Encounter (HOSPITAL_COMMUNITY)
Admission: RE | Admit: 2017-02-04 | Discharge: 2017-02-04 | Disposition: A | Discharge status: Home / Self Care | Payer: BLUE CROSS/BLUE SHIELD | Source: Ambulatory Visit | Attending: Cardiology | Admitting: Cardiology

## 2017-02-04 DIAGNOSIS — I214 Non-ST elevation (NSTEMI) myocardial infarction: Secondary | ICD-10-CM | POA: Diagnosis not present

## 2017-02-04 LAB — GLUCOSE, CAPILLARY
Glucose-Capillary: 268 mg/dL — ABNORMAL HIGH (ref 65–99)
Glucose-Capillary: 248 mg/dL — ABNORMAL HIGH (ref 65–99)

## 2017-02-06 ENCOUNTER — Encounter (HOSPITAL_COMMUNITY)
Admission: RE | Admit: 2017-02-06 | Discharge: 2017-02-06 | Disposition: A | Discharge status: Home / Self Care | Payer: BLUE CROSS/BLUE SHIELD | Source: Ambulatory Visit | Attending: Cardiology | Admitting: Cardiology

## 2017-02-06 DIAGNOSIS — I214 Non-ST elevation (NSTEMI) myocardial infarction: Secondary | ICD-10-CM

## 2017-02-06 NOTE — Progress Notes (Signed)
Cardiac Individual Treatment Plan  Patient Details  Name: Lynn [name] MRN: [id number] Date of Birth: [birth year] Referring Provider:     CARDIAC REHAB PHASE II ORIENTATION from 01/22/2017 in [place]  Referring Provider  Glenetta Hew, MD.      Initial Encounter Date:    CARDIAC REHAB PHASE II ORIENTATION from 01/22/2017 in [place]  Date  01/22/17  Referring Provider  Glenetta Hew, MD.      Visit Diagnosis: NSTEMI (non-ST elevated myocardial infarction) [facility])  Patient's Home Medications on Admission:  Current Outpatient Prescriptions:  .  aspirin 81 MG chewable tablet, Chew 1 tablet (81 mg total) by mouth daily., Disp: 30 tablet, Rfl:  .  atorvastatin (LIPITOR) 80 MG tablet, Take 1 tablet (80 mg total) by mouth daily at 6 PM., Disp: 30 tablet, Rfl: 6 .  blood glucose meter kit and supplies KIT, Use up to four times daily as directed.Dx: E11.9, Disp: 1 each, Rfl: 0 .  Blood Glucose Monitoring Suppl (ACCU-CHEK AVIVA PLUS) W/DEVICE KIT, , Disp: , Rfl:  .  glipiZIDE (GLUCOTROL) 5 MG tablet, Take 1 tablet (5 mg total) by mouth 2 (two) times daily before a meal., Disp: 180 tablet, Rfl: 3 .  glucose blood test strip, Use up to four times daily as directly. Dx E11.9, Disp: 400 each, Rfl: 3 .  Insulin Pen Needle (PEN NEEDLES) 31G X 5 MM MISC, 1 Units by Does not apply route daily. For use with victoza pen, Disp: 100 each, Rfl: 3 .  isosorbide mononitrate (IMDUR) 30 MG 24 hr tablet, Take 0.5 tablets (15 mg total) by mouth daily., Disp: 45 tablet, Rfl: 3 .  losartan-hydrochlorothiazide (HYZAAR) 100-12.5 MG tablet, TAKE 1 TABLET BY MOUTH DAILY (Patient taking differently: Take 1 tablet by mouth daily with supper. TAKE 1 TABLET BY MOUTH DAIL), Disp: 90 tablet, Rfl: 3 .  metoprolol tartrate (LOPRESSOR) 25 MG tablet, Take 1 tablet (25 mg total) by mouth 2 (two) times daily., Disp: 60 tablet, Rfl: 6 .  Multiple Vitamin  (MULTIVITAMIN WITH MINERALS) TABS tablet, Take 1 tablet by mouth daily. Reported on 01/03/2016, Disp: , Rfl:  .  nitroGLYCERIN (NITROSTAT) 0.4 MG SL tablet, Place 1 tablet (0.4 mg total) under the tongue every 5 (five) minutes x 3 doses as needed for chest pain., Disp: 25 tablet, Rfl: 3 .  PRESCRIPTION MEDICATION, Inhale into the lungs at bedtime. CPAP, Disp: , Rfl:  .  VICTOZA 18 MG/3ML SOPN, START WITH 0.6 MG INJECTED [place] DAILY FOR ONE WEEK, THEN INCREASE TO 1.2 MG DAILY, Disp: 6 mL, Rfl: 3  Past Medical History: Past Medical History:  Diagnosis Date  . Arthritis   . CAD (coronary artery disease)    a. 11/2016 NSTEMI/Cath: LM nl, LAD 40p, 61m, D1/2 small, LCX 40ost, OM2/3 nl/small, RCA 35 ost/mid, RPDA/RPL small/nl.  . Clotting disorder ([place]    blood clot in eye 2016  . Diabetes mellitus without complication ([place]   . Diastolic dysfunction    a. 11/2016 Echo: EF 55-60%, gr1 DD, sev calcified MV annulus, mildly dil LA.  .[place] KitchenEye hemorrhage 4/16   resolved rt  . Hyperlipidemia   . Hypertension   . Microcytic anemia   . Sleep apnea    use CPAP  . Thalassemia   . Ulcer     Tobacco Use: History  Smoking Status  . Former Smoker  . Packs/day: 0.25  . Years: 44.00  . Types: Cigarettes  . Quit  date: 02/17/2015  Smokeless Tobacco  . Never Used    Labs: Recent Review Flowsheet Data    Labs for ITP Cardiac and Pulmonary Rehab Latest Ref Rng & Units 07/13/2015 01/03/2016 05/14/2016 09/13/2016 11/17/2016   Cholestrol 0 - 200 mg/dL 207(H) 201(H) - - 171   LDLCALC 0 - 99 mg/dL 137(H) 123 - - 99   LDLDIRECT mg/dL - - - - -   HDL >40 mg/dL 50 50 - - 40(L)   Trlycerides <150 mg/dL 100 139 - - 161(H)   Hemoglobin A1c 4.8 - 5.6 % 6.6(H) 7.6 9.3(H) 8.5(H) 7.7(H)      Capillary Blood Glucose: Lab Results  Component Value Date   GLUCAP 248 (H) 02/04/2017   GLUCAP 268 (H) 02/04/2017   GLUCAP 197 (H) 02/01/2017   GLUCAP 256 (H) 02/01/2017   GLUCAP 183 (H) 01/30/2017     Exercise Target  Goals:    Exercise Program Goal: Individual exercise prescription set with THRR, safety & activity barriers. Participant demonstrates ability to understand and report RPE using BORG scale, to self-measure pulse accurately, and to acknowledge the importance of the exercise prescription.  Exercise Prescription Goal: Starting with aerobic activity 30 plus minutes a day, 3 days per week for initial exercise prescription. Provide home exercise prescription and guidelines that participant acknowledges understanding prior to discharge.  Activity Barriers & Risk Stratification:     Activity Barriers & Cardiac Risk Stratification - 01/22/17 0840      Activity Barriers & Cardiac Risk Stratification   Activity Barriers Left Knee Replacement;Right Knee Replacement;Deconditioning;Muscular Weakness;Back Problems   Cardiac Risk Stratification High      6 Minute Walk:     6 Minute Walk    Row Name 01/22/17 1121         6 Minute Walk   Phase Initial     Distance 899 feet     Walk Time 4.3 minutes     # of Rest Breaks 1     MPH 2.38     METS 1.94     RPE 13     VO2 Peak 6.78     Symptoms Yes (comment)     Comments Pt c/o dizziness after 2 laps. BP at that time was 144/80. Symptoms resolved after rest break and pt wished to continue walk test.     Resting HR 84 bpm     Resting BP 118/68     Max Ex. HR 110 bpm     Max Ex. BP 138/82     2 Minute Post BP 120/60        Oxygen Initial Assessment:   Oxygen Re-Evaluation:   Oxygen Discharge (Final Oxygen Re-Evaluation):   Initial Exercise Prescription:     Initial Exercise Prescription - 01/22/17 1200      Date of Initial Exercise RX and Referring Provider   Date 01/22/17   Referring Provider Glenetta Hew, MD.     Recumbant Bike   Level 1   Watts 15   Minutes 10   METs 2.44     NuStep   Level 1   SPM 85   Minutes 10   METs 2.4     Arm Ergometer   Level 1   Watts 20   Minutes 10   METs 1.98     Track   Laps  8   Minutes 10   METs 2.39     Prescription Details   Frequency (times per week) 3   Duration Progress to  30 minutes of continuous aerobic without signs/symptoms of physical distress     Intensity   THRR 40-80% of Max Heartrate 63-126   Ratings of Perceived Exertion 11-13   Perceived Dyspnea 0-4     Progression   Progression Continue to progress workloads to maintain intensity without signs/symptoms of physical distress.     Resistance Training   Training Prescription Yes   Weight 1lb.   Reps 10-15      Perform Capillary Blood Glucose checks as needed.  Exercise Prescription Changes:   Exercise Comments:     Exercise Comments    Row Name 01/30/17 1734           Exercise Comments She did great on her first day of exercise this week!          Exercise Goals and Review:     Exercise Goals    Row Name 01/22/17 0841             Exercise Goals   Increase Physical Activity Yes       Intervention Provide advice, education, support and counseling about physical activity/exercise needs.;Develop an individualized exercise prescription for aerobic and resistive training based on initial evaluation findings, risk stratification, comorbidities and participant's personal goals.       Expected Outcomes Achievement of increased cardiorespiratory fitness and enhanced flexibility, muscular endurance and strength shown through measurements of functional capacity and personal statement of participant.       Increase Strength and Stamina Yes       Intervention Provide advice, education, support and counseling about physical activity/exercise needs.;Develop an individualized exercise prescription for aerobic and resistive training based on initial evaluation findings, risk stratification, comorbidities and participant's personal goals.       Expected Outcomes Achievement of increased cardiorespiratory fitness and enhanced flexibility, muscular endurance and strength shown through  measurements of functional capacity and personal statement of participant.          Exercise Goals Re-Evaluation :    Discharge Exercise Prescription (Final Exercise Prescription Changes):   Nutrition:  Target Goals: Understanding of nutrition guidelines, daily intake of sodium <153m, cholesterol <2012m calories 30% from fat and 7% or less from saturated fats, daily to have 5 or more servings of fruits and vegetables.  Biometrics:     Pre Biometrics - 01/22/17 1121      Pre Biometrics   Height 5' 3.25" (1.607 m)   Weight 236 lb 12.4 oz (107.4 kg)   Waist Circumference 47 inches   Hip Circumference 49 inches   Waist to Hip Ratio 0.96 %   BMI (Calculated) 41.7   Triceps Skinfold 68 mm   % Body Fat 55.5 %   Grip Strength 30 kg   Flexibility 9.75 in   Single Leg Stand 9.56 seconds       Nutrition Therapy Plan and Nutrition Goals:     Nutrition Therapy & Goals - 01/29/17 0941      Nutrition Therapy   Diet Carb Modified, Therapeutic Lifestyle Changes     Personal Nutrition Goals   Nutrition Goal Wt loss of 1-2 lb/week to a wt loss goal of 6-24 lb at graduation from Cardiac Rehab.    Personal Goal #2 Improved glycemic control as evidenced by a decrease in A1c from 7.7 toward less than 7.0     Intervention Plan   Intervention Prescribe, educate and counsel regarding individualized specific dietary modifications aiming towards targeted core components such as weight, hypertension, lipid management, diabetes, heart failure and  other comorbidities.   Expected Outcomes Short Term Goal: Understand basic principles of dietary content, such as calories, fat, sodium, cholesterol and nutrients.;Long Term Goal: Adherence to prescribed nutrition plan.      Nutrition Discharge: Nutrition Scores:   Nutrition Goals Re-Evaluation:   Nutrition Goals Re-Evaluation:   Nutrition Goals Discharge (Final Nutrition Goals Re-Evaluation):   Psychosocial: Target Goals: Acknowledge  presence or absence of significant depression and/or stress, maximize coping skills, provide positive support system. Participant is able to verbalize types and ability to use techniques and skills needed for reducing stress and depression.  Initial Review & Psychosocial Screening:     Initial Psych Review & Screening - 01/22/17 [place]? Yes     Barriers   Psychosocial barriers to participate in program There are no identifiable barriers or psychosocial needs.     Screening Interventions   Interventions Encouraged to exercise      Quality of Life Scores:     Quality of Life - 01/22/17 0925      Quality of Life Scores   Health/Function Pre 19.97 %   Socioeconomic Pre 23.14 %   Psych/Spiritual Pre 25.71 %   Family Pre 23 %   GLOBAL Pre 22.23 %      PHQ-9: Recent Review Flowsheet Data    Depression screen [facility] 2/9 01/30/2017 01/03/2016 07/13/2015 03/07/2015   Decreased Interest 0 0 0 0   Down, Depressed, Hopeless 0 0 0 0   PHQ - 2 Score 0 0 0 0     Interpretation of Total Score  Total Score Depression Severity:  1-4 = Minimal depression, 5-9 = Mild depression, 10-14 = Moderate depression, 15-19 = Moderately severe depression, 20-27 = Severe depression   Psychosocial Evaluation and Intervention:   Psychosocial Re-Evaluation:     Psychosocial Re-Evaluation    Row Name 02/06/17 1832             Psychosocial Re-Evaluation   Current issues with None Identified       Interventions Encouraged to attend Cardiac Rehabilitation for the exercise       Continue Psychosocial Services  No Follow up required          Psychosocial Discharge (Final Psychosocial Re-Evaluation):     Psychosocial Re-Evaluation - 02/06/17 1832      Psychosocial Re-Evaluation   Current issues with None Identified   Interventions Encouraged to attend Cardiac Rehabilitation for the exercise   Continue Psychosocial Services  No Follow up required       Vocational Rehabilitation: Provide vocational rehab assistance to qualifying candidates.   Vocational Rehab Evaluation & Intervention:     Vocational Rehab - 01/22/17 1655      Initial Vocational Rehab Evaluation & Intervention   Assessment shows need for Vocational Rehabilitation No      Education: Education Goals: Education classes will be provided on a weekly basis, covering required topics. Participant will state understanding/return demonstration of topics presented.  Learning Barriers/Preferences:     Learning Barriers/Preferences - 01/22/17 0840      Learning Barriers/Preferences   Learning Barriers Sight   Learning Preferences Written Material;Verbal Instruction      Education Topics: Count Your Pulse:  -Group instruction provided by verbal instruction, demonstration, patient participation and written materials to support subject.  Instructors address importance of being able to find your pulse and how to count your pulse when at home without a heart monitor.  Patients get hands on  experience counting their pulse with staff help and individually.   Heart Attack, Angina, and Risk Factor Modification:  -Group instruction provided by verbal instruction, video, and written materials to support subject.  Instructors address signs and symptoms of angina and heart attacks.    Also discuss risk factors for heart disease and how to make changes to improve heart health risk factors.   Functional Fitness:  -Group instruction provided by verbal instruction, demonstration, patient participation, and written materials to support subject.  Instructors address safety measures for doing things around the house.  Discuss how to get up and down off the floor, how to pick things up properly, how to safely get out of a chair without assistance, and balance training.   Meditation and Mindfulness:  -Group instruction provided by verbal instruction, patient participation, and written  materials to support subject.  Instructor addresses importance of mindfulness and meditation practice to help reduce stress and improve awareness.  Instructor also leads participants through a meditation exercise.    Stretching for Flexibility and Mobility:  -Group instruction provided by verbal instruction, patient participation, and written materials to support subject.  Instructors lead participants through series of stretches that are designed to increase flexibility thus improving mobility.  These stretches are additional exercise for major muscle groups that are typically performed during regular warm up and cool down.   Hands Only CPR Anytime:  -Group instruction provided by verbal instruction, video, patient participation and written materials to support subject.  Instructors co-teach with AHA video for hands only CPR.  Participants get hands on experience with mannequins.   Nutrition I class: Heart Healthy Eating:  -Group instruction provided by PowerPoint slides, verbal discussion, and written materials to support subject matter. The instructor gives an explanation and review of the Therapeutic Lifestyle Changes diet recommendations, which includes a discussion on lipid goals, dietary fat, sodium, fiber, plant stanol/sterol esters, sugar, and the components of a well-balanced, healthy diet.   CARDIAC REHAB PHASE II EXERCISE from 02/01/2017 in [place]  Date  02/01/17  Educator  RD  Instruction Review Code  Not applicable [class handouts given]      Nutrition II class: Lifestyle Skills:  -Group instruction provided by PowerPoint slides, verbal discussion, and written materials to support subject matter. The instructor gives an explanation and review of label reading, grocery shopping for heart health, heart healthy recipe modifications, and ways to make healthier choices when eating out.   CARDIAC REHAB PHASE II EXERCISE from 02/01/2017 in [place]  Date  02/01/17  Educator  RD  Instruction Review Code  Not applicable [class handouts given]      Diabetes Question & Answer:  -Group instruction provided by PowerPoint slides, verbal discussion, and written materials to support subject matter. The instructor gives an explanation and review of diabetes co-morbidities, pre- and post-prandial blood glucose goals, pre-exercise blood glucose goals, signs, symptoms, and treatment of hypoglycemia and hyperglycemia, and foot care basics.   Diabetes Blitz:  -Group instruction provided by PowerPoint slides, verbal discussion, and written materials to support subject matter. The instructor gives an explanation and review of the physiology behind type 1 and type 2 diabetes, diabetes medications and rational behind using different medications, pre- and post-prandial blood glucose recommendations and Hemoglobin A1c goals, diabetes diet, and exercise including blood glucose guidelines for exercising safely.    CARDIAC REHAB PHASE II EXERCISE from 02/01/2017 in [place]  Date  02/01/17  Educator  RD  Instruction Review Code  Not applicable [class handouts given]      Portion Distortion:  -Group instruction provided by PowerPoint slides, verbal discussion, written materials, and food models to support subject matter. The instructor gives an explanation of serving size versus portion size, changes in portions sizes over the last 20 years, and what consists of a serving from each food group.   Stress Management:  -Group instruction provided by verbal instruction, video, and written materials to support subject matter.  Instructors review role of stress in heart disease and how to cope with stress positively.     Exercising on Your Own:  -Group instruction provided by verbal instruction, power point, and written materials to support subject.  Instructors discuss benefits of exercise,  components of exercise, frequency and intensity of exercise, and end points for exercise.  Also discuss use of nitroglycerin and activating EMS.  Review options of places to exercise outside of rehab.  Review guidelines for sex with heart disease.   Cardiac Drugs I:  -Group instruction provided by verbal instruction and written materials to support subject.  Instructor reviews cardiac drug classes: antiplatelets, anticoagulants, beta blockers, and statins.  Instructor discusses reasons, side effects, and lifestyle considerations for each drug class.   Cardiac Drugs II:  -Group instruction provided by verbal instruction and written materials to support subject.  Instructor reviews cardiac drug classes: angiotensin converting enzyme inhibitors (ACE-I), angiotensin II receptor blockers (ARBs), nitrates, and calcium channel blockers.  Instructor discusses reasons, side effects, and lifestyle considerations for each drug class.   Anatomy and Physiology of the Circulatory System:  -Group instruction provided by verbal instruction, video, and written materials to support subject.  Reviews functional anatomy of heart, how it relates to various diagnoses, and what role the heart plays in the overall system.   Knowledge Questionnaire Score:     Knowledge Questionnaire Score - 01/22/17 0904      Knowledge Questionnaire Score   Pre Score 18/24      Core Components/Risk Factors/Patient Goals at Admission:     Personal Goals and Risk Factors at Admission - 01/22/17 0842      Core Components/Risk Factors/Patient Goals on Admission    Weight Management Yes;Obesity;Weight Loss;Weight Maintenance   Intervention Weight Management: Develop a combined nutrition and exercise program designed to reach desired caloric intake, while maintaining appropriate intake of nutrient and fiber, sodium and fats, and appropriate energy expenditure required for the weight goal.;Weight Management: Provide education and  appropriate resources to help participant work on and attain dietary goals.;Weight Management/Obesity: Establish reasonable short term and long term weight goals.;Obesity: Provide education and appropriate resources to help participant work on and attain dietary goals.   Expected Outcomes Short Term: Continue to assess and modify interventions until short term weight is achieved;Long Term: Adherence to nutrition and physical activity/exercise program aimed toward attainment of established weight goal;Weight Maintenance: Understanding of the daily nutrition guidelines, which includes 25-35% calories from fat, 7% or less cal from saturated fats, less than 259m cholesterol, less than 1.5gm of sodium, & 5 or more servings of fruits and vegetables daily;Weight Loss: Understanding of general recommendations for a balanced deficit meal plan, which promotes 1-2 lb weight loss per week and includes a negative energy balance of [phone] kcal/d;Understanding recommendations for meals to include 15-35% energy as protein, 25-35% energy from fat, 35-60% energy from carbohydrates, less than 20107mof dietary cholesterol, 20-35 gm of total fiber daily;Understanding of distribution of calorie intake throughout the day  with the consumption of 4-5 meals/snacks   Diabetes Yes   Intervention Provide education about signs/symptoms and action to take for hypo/hyperglycemia.;Provide education about proper nutrition, including hydration, and aerobic/resistive exercise prescription along with prescribed medications to achieve blood glucose in normal ranges: Fasting glucose 65-99 mg/dL   Expected Outcomes Short Term: Participant verbalizes understanding of the signs/symptoms and immediate care of hyper/hypoglycemia, proper foot care and importance of medication, aerobic/resistive exercise and nutrition plan for blood glucose control.;Long Term: Attainment of HbA1C < 7%.   Hypertension Yes   Intervention Provide education on lifestyle  modifcations including regular physical activity/exercise, weight management, moderate sodium restriction and increased consumption of fresh fruit, vegetables, and low fat dairy, alcohol moderation, and smoking cessation.;Monitor prescription use compliance.   Expected Outcomes Short Term: Continued assessment and intervention until BP is < 140/43m HG in hypertensive participants. < 130/814mHG in hypertensive participants with diabetes, heart failure or chronic kidney disease.;Long Term: Maintenance of blood pressure at goal levels.   Lipids Yes   Intervention Provide education and support for participant on nutrition & aerobic/resistive exercise along with prescribed medications to achieve LDL <7065mHDL >22m60m Expected Outcomes Short Term: Participant states understanding of desired cholesterol values and is compliant with medications prescribed. Participant is following exercise prescription and nutrition guidelines.;Long Term: Cholesterol controlled with medications as prescribed, with individualized exercise RX and with personalized nutrition plan. Value goals: LDL < 70mg34mL > 40 mg.   Personal Goal Other Yes   Personal Goal Increase knowledge on diabetes and enjoy life   Intervention Provide nutrition counseling and diabetes education to aid in increasing awareness/understanding on diabetes management   Expected Outcomes Pt will have increased knowledge on diabetes and improve overall quality of life.      Core Components/Risk Factors/Patient Goals Review:    Core Components/Risk Factors/Patient Goals at Discharge (Final Review):    ITP Comments:     ITP Comments    Row Name 01/22/17 0826           ITP Comments Medical Director:  Dr. TraciFransico Him      Comments: PatriEssicamaking expected progress toward personal goals after completing 5 sessions. Recommend continued exercise and life style modification education including  stress management and relaxation techniques  to decrease cardiac risk profile. PatriCesilianjoying participating in phase 2 cardiac rehab and is off to a good start.QUALITY OF LIFE SCORE REVIEW  Pt completed Quality of Life survey as a participant in Cardiac Rehab. Scores 21.0 or below are considered low. Pt score very low in health and functioning.Overall 22.23, Health and Function 19.97, socioeconomic 23.14, physiological and spiritual 25.71, family 23.00. Patient quality of life slightly altered by physical constraints which limits ability to perform as prior to recent cardiac illness. Pat hFraser [place] energy since she has been participating in phase 2 cardiac rehab. Offered emotional support and reassurance.  Will continue to monitor and intervene as necessary. MariaBarnet PallBSN 02/06/2017 6:41 PM

## 2017-02-08 ENCOUNTER — Encounter (HOSPITAL_COMMUNITY): Payer: BLUE CROSS/BLUE SHIELD

## 2017-02-10 ENCOUNTER — Other Ambulatory Visit: Payer: Self-pay | Admitting: Family Medicine

## 2017-02-10 DIAGNOSIS — D569 Thalassemia, unspecified: Secondary | ICD-10-CM

## 2017-02-11 ENCOUNTER — Encounter (HOSPITAL_COMMUNITY)
Admission: RE | Admit: 2017-02-11 | Discharge: 2017-02-11 | Disposition: A | Discharge status: Home / Self Care | Payer: BLUE CROSS/BLUE SHIELD | Source: Ambulatory Visit | Attending: Cardiology | Admitting: Cardiology

## 2017-02-11 DIAGNOSIS — I214 Non-ST elevation (NSTEMI) myocardial infarction: Secondary | ICD-10-CM | POA: Diagnosis not present

## 2017-02-11 LAB — GLUCOSE, CAPILLARY: Glucose-Capillary: 316 mg/dL — ABNORMAL HIGH (ref 65–99)

## 2017-02-11 NOTE — Progress Notes (Signed)
Incomplete Session Note  Patient Details  Name: Lynn [name] MRN: [id number] Date of Birth: [birth year] Referring Provider:     [place] from 01/22/2017 in [place]  Referring Provider  Glenetta Hew, MD.      [name] did not complete her rehab session. Elevated pre exercise blood glucose - 316.  Pt asked for rehab staff to check her blood glucose. Pt home cbg was 221 which pt felt was unusual for her. Pt had baked chicken and rice for dinner and later than her usual. Pt was out of town over the weekend.  Pt advised to continue to monitor her blood glucose throughout the day and increase her water intake.  Pt verbalized understanding and is in agreement of this. Cherre Huger, BSN Cardiac and Training and development officer

## 2017-02-13 ENCOUNTER — Encounter (HOSPITAL_COMMUNITY)
Admission: RE | Admit: 2017-02-13 | Discharge: 2017-02-13 | Disposition: A | Discharge status: Home / Self Care | Payer: BLUE CROSS/BLUE SHIELD | Source: Ambulatory Visit | Attending: Cardiology | Admitting: Cardiology

## 2017-02-13 DIAGNOSIS — Z96653 Presence of artificial knee joint, bilateral: Secondary | ICD-10-CM | POA: Diagnosis not present

## 2017-02-13 DIAGNOSIS — E785 Hyperlipidemia, unspecified: Secondary | ICD-10-CM | POA: Insufficient documentation

## 2017-02-13 DIAGNOSIS — I10 Essential (primary) hypertension: Secondary | ICD-10-CM | POA: Insufficient documentation

## 2017-02-13 DIAGNOSIS — I2510 Atherosclerotic heart disease of native coronary artery without angina pectoris: Secondary | ICD-10-CM | POA: Diagnosis not present

## 2017-02-13 DIAGNOSIS — E119 Type 2 diabetes mellitus without complications: Secondary | ICD-10-CM | POA: Insufficient documentation

## 2017-02-13 DIAGNOSIS — Z87891 Personal history of nicotine dependence: Secondary | ICD-10-CM | POA: Diagnosis not present

## 2017-02-13 DIAGNOSIS — I214 Non-ST elevation (NSTEMI) myocardial infarction: Secondary | ICD-10-CM | POA: Insufficient documentation

## 2017-02-13 LAB — GLUCOSE, CAPILLARY: Glucose-Capillary: 294 mg/dL — ABNORMAL HIGH (ref 65–99)

## 2017-02-13 NOTE — Progress Notes (Signed)
Lynn [name] 61 y.o. female  Nutrition Note Spoke with pt. Pt fasting CBG this morning 136 mg/dL. Pt states she took her Glipizide and Victoza and then ate breakfast. Pt reports she ate 1/2 cup orange juice, honey nut cheerios, 2% milk, and rye toast with cream cheese. Pre-exercise CBG 294 mg/dL. Pt stopped taking Metformin after hospitalization due to c/o diarrhea. Per discussion, pt took Metformin for several years. Pt denies any s/s of illness, recent steroid injections, or oral steroid use. Pt reports she has been trying to get better control of her DM. Pt states she can increase her Victoza from 1.2 to 1.8. Per discussion, ? If pt may benefit from Endocrinology consult given pt h/o Thalassemia and heart disease. Continue client-centered nutrition education by RD as part of interdisciplinary care.  Monitor and evaluate progress toward nutrition goal with team.  Derek Mound, M.Ed, RD, LDN, CDE 02/13/2017 10:38 AM

## 2017-02-14 ENCOUNTER — Telehealth: Payer: Self-pay | Admitting: Family Medicine

## 2017-02-14 NOTE — Telephone Encounter (Signed)
  11/2016 Lab Results  Component Value Date   HGBA1C 7.7 (H) 11/17/2016    Gave her a call- she stopped her metformin 3-4 months ago on the advice of her GI doctor because they thought it might be triggering diarrhea and GI upset.  However, since she stopped this med her post- prandial glucose readings are getting to be around 300.   She is still on her victoza and glipizide Will try adding metformin ER to her regimen- 500 mg, and can increase to 2 pills daily if tolerated well.  If NOT tolerated she will stop use and contact me Plan to recheck in approx 8 weeks

## 2017-02-14 NOTE — Telephone Encounter (Signed)
-----   Message from Jewel Baize, [place] sent at 02/13/2017 10:39 AM EDT ----- Regarding: Pt's DM Pt CBG's have been elevated prior to exercise. Please see RD note. Pt's Metformin was discontinued after recent hospitalization. Pt states she can increase her Victoza to 1.8. ? If pt may benefit from a trial of Metformin ER and/or an Endocrinology consult. Please advise prn.  Thank you, Derek Mound, M.Ed, RD, LDN, CDE

## 2017-02-15 ENCOUNTER — Encounter: Payer: Self-pay | Admitting: Family Medicine

## 2017-02-15 ENCOUNTER — Encounter (HOSPITAL_COMMUNITY)
Admission: RE | Admit: 2017-02-15 | Discharge: 2017-02-15 | Disposition: A | Discharge status: Home / Self Care | Payer: BLUE CROSS/BLUE SHIELD | Source: Ambulatory Visit | Attending: Cardiology | Admitting: Cardiology

## 2017-02-15 VITALS — Wt 239.2 lb

## 2017-02-15 DIAGNOSIS — I214 Non-ST elevation (NSTEMI) myocardial infarction: Secondary | ICD-10-CM

## 2017-02-15 LAB — GLUCOSE, CAPILLARY
Glucose-Capillary: 200 mg/dL — ABNORMAL HIGH (ref 65–99)
Glucose-Capillary: 204 mg/dL — ABNORMAL HIGH (ref 65–99)

## 2017-02-15 MED ORDER — METFORMIN HCL ER 500 MG PO TB24: ORAL_TABLET | ORAL | 5 refills | Status: DC

## 2017-02-15 NOTE — Progress Notes (Signed)
Reviewed home exercise program with patient.  Discussed mode/frequency of exercise, THRR, RPE scale and weather conditions for exercising outdoors.  Also discussed signs and symptoms, NTG use and when to call Dr./911.  Pt verbalized understanding.   Cleda Mccreedy, MS ACSM RCEP 02/15/2017 11:50

## 2017-02-18 ENCOUNTER — Encounter (HOSPITAL_COMMUNITY): Payer: BLUE CROSS/BLUE SHIELD

## 2017-02-20 ENCOUNTER — Telehealth: Payer: Self-pay | Admitting: Cardiology

## 2017-02-20 ENCOUNTER — Encounter (HOSPITAL_COMMUNITY)
Admission: RE | Admit: 2017-02-20 | Discharge: 2017-02-20 | Disposition: A | Discharge status: Home / Self Care | Payer: BLUE CROSS/BLUE SHIELD | Source: Ambulatory Visit | Attending: Cardiology | Admitting: Cardiology

## 2017-02-20 DIAGNOSIS — I214 Non-ST elevation (NSTEMI) myocardial infarction: Secondary | ICD-10-CM | POA: Diagnosis not present

## 2017-02-20 LAB — GLUCOSE, CAPILLARY: Glucose-Capillary: 217 mg/dL — ABNORMAL HIGH (ref 65–99)

## 2017-02-20 NOTE — Telephone Encounter (Signed)
Please call,pt is there in rehab,having low blood pressure problems.

## 2017-02-20 NOTE — Progress Notes (Signed)
Incomplete Session Note  Patient Details  Name: Lynn [name] MRN: [id number] Date of Birth: 07-04-1956 Referring Provider:     [place] from 01/22/2017 in [place]  Referring Provider  Glenetta Hew, MD.      [name] did not complete her rehab session.  [name] reports feeling short of breath this morning and did not come due to diarrhea on Monday. [name] started metformin 4 days ago. Upon assessment lung fields clear upon ascultation. [name] did report feeling lightheaded when she takes a deep breath. Sitting blood pressure 110/70. Standing blood pressure 94/60. Heart rate 76. [name] PA called and notified. No new order received. [name] said if she continues to have problems to have [name] make an appointment to be seen at Dr Allison Quarry office in the near future. [name] has an appointment to see Dr Ellyn Hack on 03/18/2017. [name] has an appointment to see Dr Marin Olp on 02/25/2017 regarding her anemia. Will fax exercise flow sheets to Dr. Allison Quarry office for review. Repeat blood pressure 110/80 sitting. 104/80 standing after drinking water. Will notify Dr Allison Quarry nurse Ivin Booty about today's blood pressures. [name] will hold off on exercising until after she follows up with Dr Marin Olp next week [name], RN,BSN 02/20/2017 10:45 AM

## 2017-02-20 NOTE — Telephone Encounter (Signed)
Late entry Spoke to [facility], states information will be sent overfrom today's session.  per Verdis Frederickson spoke to extender  If patient continue to have issues contact office for an appointment.  Patient has an an appointment with Dr Marin Olp on Tuesday .  ( consistent anemia). Will hold cardiac rehab until appointment on Tuesday.

## 2017-02-21 ENCOUNTER — Encounter (HOSPITAL_COMMUNITY): Payer: Self-pay

## 2017-02-21 ENCOUNTER — Emergency Department (HOSPITAL_COMMUNITY): Payer: BLUE CROSS/BLUE SHIELD

## 2017-02-21 ENCOUNTER — Emergency Department (HOSPITAL_COMMUNITY)
Admission: EM | Admit: 2017-02-21 | Discharge: 2017-02-21 | Disposition: A | Discharge status: Home / Self Care | Payer: BLUE CROSS/BLUE SHIELD | Attending: Emergency Medicine | Admitting: Emergency Medicine

## 2017-02-21 DIAGNOSIS — Z87891 Personal history of nicotine dependence: Secondary | ICD-10-CM | POA: Insufficient documentation

## 2017-02-21 DIAGNOSIS — R0789 Other chest pain: Secondary | ICD-10-CM | POA: Diagnosis present

## 2017-02-21 DIAGNOSIS — I10 Essential (primary) hypertension: Secondary | ICD-10-CM | POA: Insufficient documentation

## 2017-02-21 DIAGNOSIS — I252 Old myocardial infarction: Secondary | ICD-10-CM | POA: Diagnosis not present

## 2017-02-21 DIAGNOSIS — R42 Dizziness and giddiness: Secondary | ICD-10-CM | POA: Diagnosis not present

## 2017-02-21 DIAGNOSIS — I2510 Atherosclerotic heart disease of native coronary artery without angina pectoris: Secondary | ICD-10-CM | POA: Insufficient documentation

## 2017-02-21 DIAGNOSIS — E119 Type 2 diabetes mellitus without complications: Secondary | ICD-10-CM | POA: Diagnosis not present

## 2017-02-21 DIAGNOSIS — Z7982 Long term (current) use of aspirin: Secondary | ICD-10-CM | POA: Insufficient documentation

## 2017-02-21 DIAGNOSIS — Z794 Long term (current) use of insulin: Secondary | ICD-10-CM | POA: Insufficient documentation

## 2017-02-21 DIAGNOSIS — Z96653 Presence of artificial knee joint, bilateral: Secondary | ICD-10-CM | POA: Diagnosis not present

## 2017-02-21 LAB — CBC
HCT: 29.6 % — ABNORMAL LOW (ref 36.0–46.0)
Hemoglobin: 8.9 g/dL — ABNORMAL LOW (ref 12.0–15.0)
MCH: 18.2 pg — ABNORMAL LOW (ref 26.0–34.0)
MCHC: 30.1 g/dL (ref 30.0–36.0)
MCV: 60.5 fL — ABNORMAL LOW (ref 78.0–100.0)
Platelets: 283 10*3/uL (ref 150–400)
RBC: 4.89 MIL/uL (ref 3.87–5.11)
RDW: 16.8 % — ABNORMAL HIGH (ref 11.5–15.5)
WBC: 9.6 10*3/uL (ref 4.0–10.5)

## 2017-02-21 LAB — BASIC METABOLIC PANEL
Anion gap: 7 (ref 5–15)
BUN: 17 mg/dL (ref 6–20)
CO2: 24 mmol/L (ref 22–32)
Calcium: 8.8 mg/dL — ABNORMAL LOW (ref 8.9–10.3)
Chloride: 104 mmol/L (ref 101–111)
Creatinine, Ser: 0.99 mg/dL (ref 0.44–1.00)
GFR calc Af Amer: >60 mL/min (ref >60)
GFR calc non Af Amer: 59 mL/min — ABNORMAL LOW (ref >60)
Glucose, Bld: 184 mg/dL — ABNORMAL HIGH (ref 65–99)
Potassium: 3.4 mmol/L — ABNORMAL LOW (ref 3.5–5.1)
Sodium: 135 mmol/L (ref 135–145)

## 2017-02-21 LAB — I-STAT TROPONIN, ED
Troponin i, poc: 0 ng/mL (ref 0.00–0.08)
Troponin i, poc: 0 ng/mL (ref 0.00–0.08)

## 2017-02-21 MED ORDER — SODIUM CHLORIDE 0.9 % IV BOLUS (SEPSIS)
500.0000 mL | Freq: Once | INTRAVENOUS | Status: AC
  Administered 2017-02-21: 500 mL via INTRAVENOUS

## 2017-02-21 NOTE — Discharge Instructions (Signed)
Please stop taking your Imdur. Cardiology has arranged follow-up appointment for you in one week for recheck on your medications.  Please return without fail for worsening symptoms, including passing out, worsening shortness of breath or chest pain, confusion, or any other symptoms concerning to you.  Please also discontinue your metformin is causing you lots of diarrhea. Please call your primary care doctor to discuss further management of your diabetes.

## 2017-02-21 NOTE — ED Notes (Signed)
Pt ambulated to restroom with steady gait. Tolerated well. Denies dizziness.

## 2017-02-21 NOTE — ED Triage Notes (Addendum)
Pt. Was at Cardiac rehab yesterday and developed sob with exertion and dizziness.  The dizziness increases with change of position and movement.  Pt. Also reports having diarrhea  For 2 days.  Pt. Denies any chest pain at this time but does report having intermittent twinges of pain .  Skin is warm and dry.  Pt. Is alert and oriented X 4.

## 2017-02-21 NOTE — ED Provider Notes (Signed)
[facility]-EMERGENCY DEPT Provider Note   CSN: [id number] Arrival date & time: 02/21/17  8838     History   Chief Complaint Chief Complaint  Patient presents with  . Shortness of Breath  . Dizziness  . Chest Pain    HPI Lynn [name] is a 61 y.o. female.  The history is provided by the patient.  Shortness of Breath  This is a recurrent problem. The problem occurs intermittently.The current episode started yesterday. The problem has not changed since onset.Associated symptoms include chest pain. Pertinent negatives include no fever, no cough and no abdominal pain. It is unknown what precipitated the problem. She has tried nothing for the symptoms.  Dizziness  Quality:  Lightheadedness Severity:  Mild Onset quality:  Unable to specify Timing:  Intermittent Progression:  Waxing and waning Chronicity:  New Context: head movement   Context: not with loss of consciousness   Relieved by:  Fluids and lying down Worsened by:  Standing up, sitting upright and movement Associated symptoms: chest pain and shortness of breath   Chest Pain   This is a recurrent problem. The current episode started yesterday. The problem occurs daily. The problem has not changed since onset.The pain is associated with rest and exertion. The pain is present in the lateral region. The pain is mild. The quality of the pain is described as brief. The pain does not radiate. Associated symptoms include dizziness and shortness of breath. Pertinent negatives include no abdominal pain, no cough and no fever. She has tried nothing for the symptoms. The treatment provided no relief.      61 year old female who presents with Intermittent dizziness, chest discomfort, shortness of breath. She has a history of coronary artery disease, diabetes, grade 1 diastolic dysfunction, hypertension, and hyperlipidemia.   Records are reviewed. She was hospitalized in February 2018 with an STEMI. She did undergo left heart  catheterization that showed moderate nonobstructive coronary artery disease, which did not require intervention. She did have runs of SVT at that time, it was felt that her dyspnea on exertions and symptoms may be related to intermittent episodes of SVT. Her beta blocker was increased.   She has been undergoing cardiac rehabilitation. States that she was at rehabilitation yesterday and developed intermittent episodes of shortness of breath with activity as well as dizziness. States that she has been having diarrhea associated with restarting her metformin recently. States that whenever she would change positions such as sitting up, moving, taking a deep breath, that she would feel very dizzy and lightheaded. Denies any syncope. States that she has also been having random episodes of shortness of breath and mild left chest discomfort, that occurs randomly, sometimes at rest, and other times with activity. It is associated with nausea but she denies any vomiting, diaphoresis, abdominal pains. No lower extremity edema.   She did notice that when she was in rehabilitation yesterday but they took her blood pressures while she was sitting and standing and noticed that there was a drop in her blood pressures upon standing.  Past Medical History:  Diagnosis Date  . Arthritis   . CAD (coronary artery disease)    a. 11/2016 NSTEMI/Cath: LM nl, LAD 40p, 36md, D1/2 small, LCX 40ost, OM2/3 nl/small, RCA 35 ost/mid, RPDA/RPL small/nl.  . Clotting disorder (HCC)    blood clot in eye 2016  . Diabetes mellitus without complication (HCC)   . Diastolic dysfunction    a. 11/2016 Echo: EF 55-60%, gr1 DD, sev calcified MV annulus, mildly  dil LA.  Marland Kitchen Eye hemorrhage 4/16   resolved rt  . Hyperlipidemia   . Hypertension   . Microcytic anemia   . Sleep apnea    use CPAP  . Thalassemia   . Ulcer     Patient Active Problem List   Diagnosis Date Noted  . CAD in native artery   . SVT (supraventricular tachycardia)  (HCC)   . Non-STEMI (non-ST elevated myocardial infarction) (HCC) 11/16/2016  . Chest pain 11/16/2016  . H/O total knee replacement 08/19/2015  . Vision, loss, sudden 03/07/2015  . S/P TKR (total knee replacement) using cement 02/18/2015  . Thalassemia 09/02/2013  . Hyperlipidemia LDL goal < 100 08/24/2012  . Diabetes mellitus (HCC) 07/22/2012  . Hypertension 07/22/2012    Past Surgical History:  Procedure Laterality Date  . ABDOMINAL HYSTERECTOMY    . arthroscopic knee Left 2-16  . CARDIAC CATHETERIZATION N/A 11/19/2016   Procedure: Left Heart Cath and Coronary Angiography;  Surgeon: Lyn Records, MD;  Location: [facility] INVASIVE CV LAB;  Service: Cardiovascular;  Laterality: N/A;  . CARPAL TUNNEL RELEASE Right 11/01/2014  . COLONOSCOPY    . JOINT REPLACEMENT    . TONSILLECTOMY    . TOTAL KNEE ARTHROPLASTY Right 02/18/2015   Procedure: RIGHT TOTAL KNEE ARTHROPLASTY;  Surgeon: Beverely Low, MD;  Location: [facility] OR;  Service: Orthopedics;  Laterality: Right;  . TOTAL KNEE ARTHROPLASTY Left 08/19/2015   Procedure: LEFT TOTAL KNEE ARTHROPLASTY;  Surgeon: Beverely Low, MD;  Location: [facility] OR;  Service: Orthopedics;  Laterality: Left;    OB History    No data available       Home Medications    Prior to Admission medications   Medication Sig Start Date End Date Taking? Authorizing Provider  acetaminophen (TYLENOL) 325 MG tablet Take 650 mg by mouth every 6 (six) hours as needed for mild pain.   Yes [provider]  aspirin 81 MG chewable tablet Chew 1 tablet (81 mg total) by mouth daily. 11/21/16  Yes Arty Baumgartner, NP  atorvastatin (LIPITOR) 80 MG tablet Take 1 tablet (80 mg total) by mouth daily at 6 PM. 11/20/16  Yes Laverda Page B, NP  blood glucose meter kit and supplies KIT Use up to four times daily as directed.Dx: E11.9 06/20/16  Yes Copland, Gwenlyn Found, MD  Blood Glucose Monitoring Suppl (ACCU-CHEK AVIVA PLUS) W/DEVICE KIT  01/19/15  Yes [provider]  cetirizine  (ZYRTEC) 10 MG tablet Take 10 mg by mouth daily as needed for allergies.   Yes [provider]  glipiZIDE (GLUCOTROL) 5 MG tablet Take 1 tablet (5 mg total) by mouth 2 (two) times daily before a meal. 06/04/16  Yes Copland, Jessica C, MD  glucose blood test strip Use up to four times daily as directly. Dx E11.9 09/13/16  Yes Copland, Gwenlyn Found, MD  Insulin Pen Needle (PEN NEEDLES) 31G X 5 MM MISC 1 Units by Does not apply route daily. For use with victoza pen 05/28/16  Yes Copland, Gwenlyn Found, MD  losartan-hydrochlorothiazide (HYZAAR) 100-12.5 MG tablet TAKE 1 TABLET BY MOUTH DAILY Patient taking differently: Take 1 tablet by mouth daily with supper. TAKE 1 TABLET BY MOUTH DAIL 05/14/16  Yes Copland, Gwenlyn Found, MD  metFORMIN (GLUCOPHAGE-XR) 500 MG 24 hr tablet Start with 1 daily, increase to 2 daily if tolerated Patient taking differently: Take 500 mg by mouth daily with breakfast.  02/15/17  Yes Copland, Gwenlyn Found, MD  metoprolol tartrate (LOPRESSOR) 25 MG tablet Take 1  tablet (25 mg total) by mouth 2 (two) times daily. 11/20/16  Yes Cheryln Manly, NP  Multiple Vitamin (MULTIVITAMIN WITH MINERALS) TABS tablet Take 1 tablet by mouth daily. Reported on 01/03/2016   Yes [provider]  nitroGLYCERIN (NITROSTAT) 0.4 MG SL tablet Place 1 tablet (0.4 mg total) under the tongue every 5 (five) minutes x 3 doses as needed for chest pain. 11/20/16  Yes Cheryln Manly, NP  PRESCRIPTION MEDICATION Inhale into the lungs at bedtime. CPAP   Yes [provider]  VICTOZA 18 MG/3ML SOPN START WITH 0.6 MG INJECTED  DAILY FOR ONE WEEK, THEN INCREASE TO 1.2 MG DAILY 01/07/17  Yes Copland, Gay Filler, MD    Family History Family History  Problem Relation Age of Onset  . Cancer Mother   . Diabetes Mother   . Hypertension Mother   . Hyperlipidemia Mother   . Cancer Father   . Hyperlipidemia Father   . Mental illness Sister   . Diabetes Sister   . Diabetes Maternal Grandmother   .  Diabetes Maternal Grandfather   . Colon cancer Neg Hx   . Esophageal cancer Neg Hx   . Stomach cancer Neg Hx   . Rectal cancer Neg Hx     Social History Social History  Substance Use Topics  . Smoking status: Former Smoker    Packs/day: 0.25    Years: 44.00    Types: Cigarettes    Quit date: 02/17/2015  . Smokeless tobacco: Never Used  . Alcohol use 0.0 oz/week     Comment: socially     Allergies   Penicillins   Review of Systems Review of Systems  Constitutional: Negative for fever.  HENT: Negative for congestion.   Respiratory: Positive for shortness of breath. Negative for cough.   Cardiovascular: Positive for chest pain.  Gastrointestinal: Negative for abdominal pain.  Genitourinary: Negative for difficulty urinating.  Neurological: Positive for dizziness.  Hematological: Does not bruise/bleed easily.  Psychiatric/Behavioral: Negative for confusion.  All other systems reviewed and are negative.    Physical Exam Updated Vital Signs BP (!) 129/58 (BP Location: Right Arm)   Pulse 64   Temp 98.2 F (36.8 C) (Oral)   Resp 19   Ht '5\' 2"'$  (1.575 m)   Wt 236 lb 1.8 oz (107.1 kg)   SpO2 99%   BMI 43.19 kg/m   Physical Exam Physical Exam  Nursing note and vitals reviewed. Constitutional: Well developed, well nourished, non-toxic, and in no acute distress Head: Normocephalic and atraumatic.  Mouth/Throat: Oropharynx is clear and moist.  Neck: Normal range of motion. Neck supple.  Cardiovascular: Normal rate and regular rhythm.   Pulmonary/Chest: Effort normal and breath sounds normal.  Abdominal: Soft. There is no tenderness. There is no rebound and no guarding.  Musculoskeletal: Normal range of motion. no edema Neurological: Alert, no facial droop, fluent speech, moves all extremities symmetrically Skin: Skin is warm and dry.  Psychiatric: Cooperative   ED Treatments / Results  Labs (all labs ordered are listed, but only abnormal results are  displayed) Labs Reviewed  BASIC METABOLIC PANEL - Abnormal; Notable for the following:       Result Value   Potassium 3.4 (*)    Glucose, Bld 184 (*)    Calcium 8.8 (*)    GFR calc non Af Amer 59 (*)    All other components within normal limits  CBC - Abnormal; Notable for the following:    Hemoglobin 8.9 (*)  HCT 29.6 (*)    MCV 60.5 (*)    MCH 18.2 (*)    RDW 16.8 (*)    All other components within normal limits  I-STAT TROPOININ, ED  I-STAT TROPOININ, ED    EKG  EKG Interpretation  Date/Time:  Thursday Feb 21 2017 15:02:14 EDT Ventricular Rate:  66 PR Interval:  208 QRS Duration: 116 QT Interval:  437 QTC Calculation: 458 R Axis:   46 Text Interpretation:  Sinus rhythm Nonspecific intraventricular conduction delay Low voltage, extremity and precordial leads no acute changes  Confirmed by Rosario Duey MD, Tequita Marrs [phone]) on 02/21/2017 3:10:58 PM       Radiology Dg Chest 2 View  Result Date: 02/21/2017 CLINICAL DATA:  Short of breath EXAM: CHEST  2 VIEW COMPARISON:  11/16/2016 FINDINGS: Mild cardiac enlargement with left ventricular enlargement. Negative for heart failure. Lungs are clear without infiltrate effusion or mass. IMPRESSION: No active cardiopulmonary disease. Electronically Signed   By: Franchot Gallo M.D.   On: 02/21/2017 09:50    Procedures Procedures (including critical care time)  Medications Ordered in ED Medications  sodium chloride 0.9 % bolus 500 mL (0 mLs Intravenous Stopped 02/21/17 1221)     Initial Impression / Assessment and Plan / ED Course  I have reviewed the triage vital signs and the nursing notes.  Pertinent labs & imaging results that were available during my care of the patient were reviewed by me and considered in my medical decision making (see chart for details).     In short, this is a 61 year old female who presents with episodic dizziness and shortness of breath. Records are reviewed, and she has a recent reassuring left heart  catheterization showing nonobstructive coronary artery disease. Felt to be less likely related to ACS. Her serial troponins are normal and EKG shows no acute ischemic changes or dynamic changes on repeat EKG. Chest x-ray shows no acute cardiopulmonary processes on visualization. She does have dizziness when she stands up and was orthostatic yesterday. This may be a component of recently increasing her dosage of beta blockers from her admission as well as diarrhea from restarting her metformin. She is given IV fluids, feels improved, ambulated steadily in the ED without any symptoms. This case with Dr. Meda Coffee from cardiology. She recommended that patient be discontinued on her indoor and have close one-week follow-up to see if her medication changes affect her symptoms. At this time, she was not felt to require admission from cardiology standpoint for ongoing cardiac work-up. Strict return and follow-up instructions reviewed. She expressed understanding of all discharge instructions and felt comfortable with the plan of care.   Final Clinical Impressions(s) / ED Diagnoses   Final diagnoses:  Dizziness    New Prescriptions Current Discharge Medication List       Forde Dandy, MD 02/21/17 [phone]

## 2017-02-22 ENCOUNTER — Encounter (HOSPITAL_COMMUNITY): Payer: BLUE CROSS/BLUE SHIELD

## 2017-02-25 ENCOUNTER — Encounter (HOSPITAL_COMMUNITY): Payer: BLUE CROSS/BLUE SHIELD

## 2017-02-26 ENCOUNTER — Ambulatory Visit (HOSPITAL_BASED_OUTPATIENT_CLINIC_OR_DEPARTMENT_OTHER): Payer: BLUE CROSS/BLUE SHIELD | Admitting: Hematology & Oncology

## 2017-02-26 ENCOUNTER — Other Ambulatory Visit (HOSPITAL_BASED_OUTPATIENT_CLINIC_OR_DEPARTMENT_OTHER): Payer: BLUE CROSS/BLUE SHIELD

## 2017-02-26 ENCOUNTER — Ambulatory Visit: Payer: BLUE CROSS/BLUE SHIELD

## 2017-02-26 VITALS — BP 133/63 | HR 67 | Temp 98.2°F | Resp 16 | Ht 64.0 in | Wt 238.1 lb

## 2017-02-26 DIAGNOSIS — E119 Type 2 diabetes mellitus without complications: Secondary | ICD-10-CM

## 2017-02-26 DIAGNOSIS — D563 Thalassemia minor: Secondary | ICD-10-CM | POA: Diagnosis not present

## 2017-02-26 DIAGNOSIS — D649 Anemia, unspecified: Secondary | ICD-10-CM | POA: Diagnosis not present

## 2017-02-26 DIAGNOSIS — R062 Wheezing: Secondary | ICD-10-CM

## 2017-02-26 DIAGNOSIS — D508 Other iron deficiency anemias: Secondary | ICD-10-CM

## 2017-02-26 LAB — CBC WITH DIFFERENTIAL (CANCER CENTER ONLY)
BASO#: 0 10*3/uL (ref 0.0–0.2)
BASO%: 0.3 % (ref 0.0–2.0)
EOS%: 2.1 % (ref 0.0–7.0)
Eosinophils Absolute: 0.2 10*3/uL (ref 0.0–0.5)
HCT: 29.1 % — ABNORMAL LOW (ref 34.8–46.6)
HGB: 9 g/dL — ABNORMAL LOW (ref 11.6–15.9)
LYMPH#: 2.5 10*3/uL (ref 0.9–3.3)
LYMPH%: 25.7 % (ref 14.0–48.0)
MCH: 18.7 pg — ABNORMAL LOW (ref 26.0–34.0)
MCHC: 30.9 g/dL — ABNORMAL LOW (ref 32.0–36.0)
MCV: 61 fL — ABNORMAL LOW (ref 81–101)
MONO#: 0.7 10*3/uL (ref 0.1–0.9)
MONO%: 7.2 % (ref 0.0–13.0)
NEUT#: 6.2 10*3/uL (ref 1.5–6.5)
NEUT%: 64.7 % (ref 39.6–80.0)
Platelets: 294 10*3/uL (ref 145–400)
RBC: 4.81 10*6/uL (ref 3.70–5.32)
RDW: 17.1 % — ABNORMAL HIGH (ref 11.1–15.7)
WBC: 9.5 10*3/uL (ref 3.9–10.0)

## 2017-02-26 LAB — CHCC SATELLITE - SMEAR

## 2017-02-26 MED ORDER — FOLIC ACID 1 MG PO TABS: 2.0000 mg | ORAL_TABLET | Freq: Every day | ORAL | 4 refills | Status: DC
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
## 2017-02-27 ENCOUNTER — Encounter (HOSPITAL_COMMUNITY): Payer: BLUE CROSS/BLUE SHIELD

## 2017-02-27 LAB — IRON AND TIBC
%SAT: 15 % — ABNORMAL LOW (ref 21–57)
Iron: 47 ug/dL (ref 41–142)
TIBC: 308 ug/dL (ref 236–444)
UIBC: 260 ug/dL (ref 120–384)

## 2017-02-27 LAB — ERYTHROPOIETIN: Erythropoietin: 29.9 m[IU]/mL — ABNORMAL HIGH (ref 2.6–18.5)

## 2017-02-27 LAB — LACTATE DEHYDROGENASE: LDH: 165 U/L (ref 125–245)

## 2017-02-27 LAB — FERRITIN: Ferritin: 58 ng/ml (ref 9–269)

## 2017-02-27 LAB — RETICULOCYTES: Reticulocyte Count: 1.8 % (ref 0.6–2.6)

## 2017-02-28 ENCOUNTER — Encounter: Payer: Self-pay | Admitting: Nurse Practitioner

## 2017-02-28 ENCOUNTER — Ambulatory Visit (INDEPENDENT_AMBULATORY_CARE_PROVIDER_SITE_OTHER): Payer: BLUE CROSS/BLUE SHIELD | Admitting: Nurse Practitioner

## 2017-02-28 VITALS — BP 112/60 | HR 70 | Ht 63.0 in | Wt 241.6 lb

## 2017-02-28 DIAGNOSIS — E785 Hyperlipidemia, unspecified: Secondary | ICD-10-CM

## 2017-02-28 DIAGNOSIS — I10 Essential (primary) hypertension: Secondary | ICD-10-CM | POA: Diagnosis not present

## 2017-02-28 DIAGNOSIS — R002 Palpitations: Secondary | ICD-10-CM

## 2017-02-28 DIAGNOSIS — I2510 Atherosclerotic heart disease of native coronary artery without angina pectoris: Secondary | ICD-10-CM | POA: Diagnosis not present

## 2017-02-28 LAB — HEMOGLOBINOPATHY EVALUATION
HGB C: 0 %
HGB S: 0 %
HGB VARIANT: 0 %
Hemoglobin A2 Quantitation: 4.4 % — ABNORMAL HIGH (ref 1.8–3.2)
Hemoglobin F Quantitation: 0 % (ref 0.0–2.0)
Hgb A: 95.6 % — ABNORMAL LOW (ref 96.4–98.8)

## 2017-02-28 NOTE — Progress Notes (Signed)
Office Visit    Patient Name: Lynn [name] Date of Encounter: 02/28/2017  Primary Care Provider:  [name], MD Primary Cardiologist:  Roni Bread, MD   Chief Complaint    61 year old female with history of nonobstructive coronary artery disease, diabetes, diastolic dysfunction, hypertension, hyperlipidemia, sleep apnea, obesity, and recent evaluation in the emergency department secondary to orthostatic dizziness, intermittent dyspnea, and atypical chest pain.  Past Medical History    Past Medical History:  Diagnosis Date  . Arthritis   . CAD (coronary artery disease)    a. 11/2016 NSTEMI/Cath: LM nl, LAD 40p, 55m, D1/2 small, LCX 40ost, OM2/3 nl/small, RCA 35 ost/mid, RPDA/RPL small/nl.  . Clotting disorder ([facility]    blood clot in eye 2016  . Diabetes mellitus without complication ([facility]   . Diastolic dysfunction    a. 11/2016 Echo: EF 55-60%, gr1 DD, sev calcified MV annulus, mildly dil LA.  .Marland KitchenEye hemorrhage 4/16   resolved rt  . Hyperlipidemia   . Hypertension   . Microcytic anemia   . Sleep apnea    use CPAP  . Thalassemia   . Ulcer    Past Surgical History:  Procedure Laterality Date  . ABDOMINAL HYSTERECTOMY    . arthroscopic knee Left 2-16  . CARDIAC CATHETERIZATION N/A 11/19/2016   Procedure: Left Heart Cath and Coronary Angiography;  Surgeon: HBelva Crome MD;  [facility];  Service: Cardiovascular;  Laterality: N/A;  . CARPAL TUNNEL RELEASE Right 11/01/2014  . COLONOSCOPY    . JOINT REPLACEMENT    . TONSILLECTOMY    . TOTAL KNEE ARTHROPLASTY Right 02/18/2015   Procedure: RIGHT TOTAL KNEE ARTHROPLASTY;  Surgeon: SNetta Cedars MD;  [facility]  Service: Orthopedics;  Laterality: Right;  . TOTAL KNEE ARTHROPLASTY Left 08/19/2015   Procedure: LEFT TOTAL KNEE ARTHROPLASTY;  Surgeon: SNetta Cedars MD;  [facility]  Service: Orthopedics;  Laterality: Left;    Allergies  Allergies  Allergen Reactions  . Penicillins Hives    Has patient had a PCN reaction causing immediate rash, facial/tongue/throat swelling, SOB or lightheadedness with hypotension: Yes Has patient had a PCN reaction causing severe rash involving mucus membranes or skin necrosis: No Has patient had a PCN reaction that required hospitalization No Has patient had a PCN reaction occurring within the last 10 years: No If all of the above answers are "NO", then may proceed with Cephalosporin use.    History of Present Illness    61year old female with the above past medical history including hypertension, hyperlipidemia, obesity, sleep apnea, nonobstructive CAD, and thalassemia. She had a non-STEMI in February 2018 with catheterization showing nonobstructive disease and normal LV function. Grade 1 diastolic dysfunction was noted. She was placed on isosorbide mononitrate due to intermittent chest discomfort on outpatient follow-up. She was doing well when she was last seen in March 2018. She was recently evaluated in the emergency department secondary to dizziness. For 2 days prior to that visit, she was having diarrhea. She was felt to be dehydrated and orthostatic. Isosorbide mononitrate was discontinued. She notes that over the past few months, she continues to have dyspnea on exertion especially when carrying a load or walking up inclines. She continues to participate in cardiac rehabilitation. She is trying to lose weight but hasn't lost any. She has also noted intermittent palpitations, usually with activity. These feel like fluttering in her chest and can last 10-15 minutes. They are sometimes associated with anxiety. She has not had  any presyncope in the setting of palpitations. She denies PND, orthopnea, syncope, edema, or early satiety. She has not been having chest pain.  Home Medications    Prior to Admission medications   Medication Sig Start Date End Date Taking? Authorizing Provider  acetaminophen (TYLENOL) 325 MG tablet Take 650 mg by mouth  every 6 (six) hours as needed for mild pain.    [provider]  aspirin 81 MG chewable tablet Chew 1 tablet (81 mg total) by mouth daily. 11/21/16   Cheryln Manly, NP  atorvastatin (LIPITOR) 80 MG tablet Take 1 tablet (80 mg total) by mouth daily at 6 PM. 11/20/16   Reino Bellis B, NP  blood glucose meter kit and supplies KIT Use up to four times daily as directed.Dx: E11.9 06/20/16   Copland, Gay Filler, MD  Blood Glucose Monitoring Suppl (ACCU-CHEK AVIVA PLUS) W/DEVICE KIT  01/19/15   [provider]  cetirizine (ZYRTEC) 10 MG tablet Take 10 mg by mouth daily as needed for allergies.    [provider]  folic acid (FOLVITE) 1 MG tablet Take 2 tablets (2 mg total) by mouth daily. 02/26/17   Volanda Napoleon, MD  glipiZIDE (GLUCOTROL) 5 MG tablet Take 1 tablet (5 mg total) by mouth 2 (two) times daily before a meal. 06/04/16   Copland, Gay Filler, MD  glucose blood test strip Use up to four times daily as directly. Dx E11.9 09/13/16   Copland, Gay Filler, MD  Insulin Pen Needle (PEN NEEDLES) 31G X 5 MM MISC 1 Units by Does not apply route daily. For use with victoza pen 05/28/16   Copland, Gay Filler, MD  losartan-hydrochlorothiazide (HYZAAR) 100-12.5 MG tablet TAKE 1 TABLET BY MOUTH DAILY Patient taking differently: Take 1 tablet by mouth daily with supper. TAKE 1 TABLET BY MOUTH DAIL 05/14/16   Copland, Gay Filler, MD  metoprolol tartrate (LOPRESSOR) 25 MG tablet Take 1 tablet (25 mg total) by mouth 2 (two) times daily. 11/20/16   Cheryln Manly, NP  Multiple Vitamin (MULTIVITAMIN WITH MINERALS) TABS tablet Take 1 tablet by mouth daily. Reported on 01/03/2016    [provider]  nitroGLYCERIN (NITROSTAT) 0.4 MG SL tablet Place 1 tablet (0.4 mg total) under the tongue every 5 (five) minutes x 3 doses as needed for chest pain. 11/20/16   Cheryln Manly, NP  PRESCRIPTION MEDICATION Inhale into the lungs at bedtime. CPAP    [provider]  VICTOZA 18 MG/3ML  SOPN START WITH 0.6 MG INJECTED [place] DAILY FOR ONE WEEK, THEN INCREASE TO 1.2 MG DAILY 01/07/17   Copland, Gay Filler, MD    Review of Systems    As above, she has had daily, brief palpitations.  She has chronic DOE.  Recent dizziness in the setting of diarrhea (resolved). No c/p, edema, syncope.  All other systems reviewed and are otherwise negative except as noted above.  Physical Exam    VS:  BP 112/60   Pulse 70   Ht _0  (1.6 m)   Wt 241 lb 9.6 oz (109.6 kg)   SpO2 99%   BMI 42.80 kg/m  , BMI Body mass index is 42.8 kg/m. GEN: Obese, in no acute distress.  HEENT: normal.  Neck: Supple, obese-difficult to gauge JVP, no bruits or masses. Cardiac: RRR, no murmurs, rubs, or gallops. No clubbing, cyanosis, edema.  Radials/DP/PT 2+ and equal bilaterally.  Respiratory:  Respirations regular and unlabored, clear to auscultation bilaterally. GI: Soft, nontender, nondistended, BS +  x 4. MS: no deformity or atrophy. Skin: warm and dry, no rash. Neuro:  Strength and sensation are intact. Psych: Normal affect.  Accessory Clinical Findings    Lab Results  Component Value Date   WBC 9.5 02/26/2017   HGB 9.0 (L) 02/26/2017   HCT 29.1 (L) 02/26/2017   MCV 61 (L) 02/26/2017   PLT 294 02/26/2017   Lab Results  Component Value Date   CREATININE 0.99 02/21/2017   BUN 17 02/21/2017   NA 135 02/21/2017   K 3.4 (L) 02/21/2017   CL 104 02/21/2017   CO2 24 02/21/2017    ecg - ER Regular sinus rhythm, 66, first degree AV block, no acute changes.  Assessment & Plan    1.  Nonobstructive coronary artery disease: Status post catheterization in February 2018 in the setting of non-STEMI. She is chronic dyspnea on exertion, which is unchanged. She has not been having any chest pain. She is no longer on isosorbide mononitrate in the setting of recent orthostatic dizziness and soft blood pressures. She has not had any increase in chest pain since being off of isosorbide. She otherwise remains on  aspirin, statin, beta blocker, and ARB therapy.  2. Palpitations: Patient reports recent increase in daily palpitations, often occurring with activity, lasting 10-15 minutes, and resolving spontaneously. Based on her description, I wonder if this represents sinus tachycardia in the setting of activity though it appears to be bothersome to her. I will arrange for 48 hour Holter monitor to assess. Continue beta blocker therapy.  3. Hyperlipidemia: LDL cholesterol was 99 in February. She was supposed to have this followed up 6 weeks later and this does not appear to have been performed, at least not through [facility]. Will have to arrange for follow-up fasting lipids and LFTs.  4. Essential hypertension: Stable on beta blocker and ARB.  5. Type 2 diabetes mellitus: This followed closely by primary care.  6. Morbid obesity: I suspect obesity and deconditioning are the primary contributors to her chronic dyspnea on exertion. I encouraged her to continue in cardiac rehabilitation.  7. Thalassemia: She is now being seen by hematology. Baseline hematocrit in the 9 range. She is now on folic acid.  8. Disposition: 48 hour Holter monitor. Follow-up in 3 months or sooner if necessary.  Murray Hodgkins, NP 02/28/2017, 10:20 AM

## 2017-02-28 NOTE — Patient Instructions (Signed)
Medication Instructions: No changes  Procedures/Testing: Your physician has recommended that you wear a 48 holter monitor. Holter monitors are medical devices that record the heart's electrical activity. Doctors most often use these monitors to diagnose arrhythmias. Arrhythmias are problems with the speed or rhythm of the heartbeat. The monitor is a small, portable device. You can wear one while you do your normal daily activities. This is usually used to diagnose what is causing palpitations/syncope (passing out). This will be placed at [address]    Follow-Up: Your physician recommends that you schedule a follow-up appointment in: 3 months with Dr. Ellyn Hack.   If you need a refill on your cardiac medications before your next appointment, please call your pharmacy.

## 2017-03-01 ENCOUNTER — Encounter (HOSPITAL_COMMUNITY): Payer: BLUE CROSS/BLUE SHIELD

## 2017-03-04 ENCOUNTER — Encounter (HOSPITAL_COMMUNITY)
Admission: RE | Admit: 2017-03-04 | Discharge: 2017-03-04 | Disposition: A | Discharge status: Home / Self Care | Payer: BLUE CROSS/BLUE SHIELD | Source: Ambulatory Visit | Attending: Cardiology | Admitting: Cardiology

## 2017-03-04 DIAGNOSIS — I214 Non-ST elevation (NSTEMI) myocardial infarction: Secondary | ICD-10-CM

## 2017-03-04 NOTE — Progress Notes (Signed)
Discharge Summary  Patient Details  Name: Lynn [name] MRN: [id number] Date of Birth: November 14, 1955 Referring Provider:     [facility] from 01/22/2017 in [place]  Referring Provider  Glenetta Hew, MD.       Number of Visits: 7  Reason for Discharge:  Early Exit:  Due to anemia  Smoking History:  History  Smoking Status  . Former Smoker  . Packs/day: 0.25  . Years: 44.00  . Types: Cigarettes  . Quit date: 02/17/2015  Smokeless Tobacco  . Never Used    Diagnosis:  NSTEMI (non-ST elevated myocardial infarction) ([place])  ADL [facility]:   Initial Exercise Prescription:     Initial Exercise Prescription - 01/22/17 1200      Date of Initial Exercise RX and Referring Provider   Date 01/22/17   Referring Provider Glenetta Hew, MD.     Recumbant Bike   Level 1   Watts 15   Minutes 10   METs 2.44     NuStep   Level 1   SPM 85   Minutes 10   METs 2.4     Arm Ergometer   Level 1   Watts 20   Minutes 10   METs 1.98     Track   Laps 8   Minutes 10   METs 2.39     Prescription Details   Frequency (times per week) 3   Duration Progress to 30 minutes of continuous aerobic without signs/symptoms of physical distress     Intensity   THRR 40-80% of Max Heartrate 63-126   Ratings of Perceived Exertion 11-13   Perceived Dyspnea 0-4     Progression   Progression Continue to progress workloads to maintain intensity without signs/symptoms of physical distress.     Resistance Training   Training Prescription Yes   Weight 1lb.   Reps 10-15      Discharge Exercise Prescription (Final Exercise Prescription Changes):     Exercise Prescription Changes - 02/20/17 1100      Response to Exercise   Blood Pressure (Admit) 112/72   Blood Pressure (Exercise) 156/70   Blood Pressure (Exit) 116/72   Heart Rate (Admit) 84 bpm   Heart Rate (Exercise) 114 bpm   Heart Rate (Exit) 84 bpm   Rating of Perceived  Exertion (Exercise) 14   Duration Progress to 45 minutes of aerobic exercise without signs/symptoms of physical distress   Intensity THRR unchanged     Progression   Progression Continue to progress workloads to maintain intensity without signs/symptoms of physical distress.   Average METs 2.3     Resistance Training   Training Prescription Yes   Weight 2lb   Reps 10-15     Recumbant Bike   Level 1   Watts 15   Minutes 10   METs 2     NuStep   Level 1   SPM 85   Minutes 10   METs 2.2     Track   Laps 8   Minutes 10   METs 2.39     Home Exercise Plan   Plans to continue exercise at Home (comment)   Frequency Add 3 additional days to program exercise sessions.   Initial Home Exercises Provided 02/13/17      Functional Capacity:     6 Minute Walk    Row Name 01/22/17 1121         6 Minute Walk   Phase Initial  Distance 899 feet     Walk Time 4.3 minutes     # of Rest Breaks 1     MPH 2.38     METS 1.94     RPE 13     VO2 Peak 6.78     Symptoms Yes (comment)     Comments Pt c/o dizziness after 2 laps. BP at that time was 144/80. Symptoms resolved after rest break and pt wished to continue walk test.     Resting HR 84 bpm     Resting BP 118/68     Max Ex. HR 110 bpm     Max Ex. BP 138/82     2 Minute Post BP 120/60        Psychological, QOL, Others - Outcomes: PHQ 2/9: Depression screen [facility] 2/9 01/30/2017 01/03/2016 07/13/2015 03/07/2015  Decreased Interest 0 0 0 0  Down, Depressed, Hopeless 0 0 0 0  PHQ - 2 Score 0 0 0 0    Quality of Life:     Quality of Life - 01/22/17 0925      Quality of Life Scores   Health/Function Pre 19.97 %   Socioeconomic Pre 23.14 %   Psych/Spiritual Pre 25.71 %   Family Pre 23 %   GLOBAL Pre 22.23 %      Personal Goals: Goals established at orientation with interventions provided to work toward goal.     Personal Goals and Risk Factors at Admission - 01/22/17 0842      Core Components/Risk  Factors/Patient Goals on Admission    Weight Management Yes;Obesity;Weight Loss;Weight Maintenance   Intervention Weight Management: Develop a combined nutrition and exercise program designed to reach desired caloric intake, while maintaining appropriate intake of nutrient and fiber, sodium and fats, and appropriate energy expenditure required for the weight goal.;Weight Management: Provide education and appropriate resources to help participant work on and attain dietary goals.;Weight Management/Obesity: Establish reasonable short term and long term weight goals.;Obesity: Provide education and appropriate resources to help participant work on and attain dietary goals.   Expected Outcomes Short Term: Continue to assess and modify interventions until short term weight is achieved;Long Term: Adherence to nutrition and physical activity/exercise program aimed toward attainment of established weight goal;Weight Maintenance: Understanding of the daily nutrition guidelines, which includes 25-35% calories from fat, 7% or less cal from saturated fats, less than 200mg  cholesterol, less than 1.5gm of sodium, & 5 or more servings of fruits and vegetables daily;Weight Loss: Understanding of general recommendations for a balanced deficit meal plan, which promotes 1-2 lb weight loss per week and includes a negative energy balance of [phone] kcal/d;Understanding recommendations for meals to include 15-35% energy as protein, 25-35% energy from fat, 35-60% energy from carbohydrates, less than 200mg  of dietary cholesterol, 20-35 gm of total fiber daily;Understanding of distribution of calorie intake throughout the day with the consumption of 4-5 meals/snacks   Diabetes Yes   Intervention Provide education about signs/symptoms and action to take for hypo/hyperglycemia.;Provide education about proper nutrition, including hydration, and aerobic/resistive exercise prescription along with prescribed medications to achieve blood  glucose in normal ranges: Fasting glucose 65-99 mg/dL   Expected Outcomes Short Term: Participant verbalizes understanding of the signs/symptoms and immediate care of hyper/hypoglycemia, proper foot care and importance of medication, aerobic/resistive exercise and nutrition plan for blood glucose control.;Long Term: Attainment of HbA1C < 7%.   Hypertension Yes   Intervention Provide education on lifestyle modifcations including regular physical activity/exercise, weight management, moderate sodium restriction and increased  consumption of fresh fruit, vegetables, and low fat dairy, alcohol moderation, and smoking cessation.;Monitor prescription use compliance.   Expected Outcomes Short Term: Continued assessment and intervention until BP is < 140/19mm HG in hypertensive participants. < 130/16mm HG in hypertensive participants with diabetes, heart failure or chronic kidney disease.;Long Term: Maintenance of blood pressure at goal levels.   Lipids Yes   Intervention Provide education and support for participant on nutrition & aerobic/resistive exercise along with prescribed medications to achieve LDL 70mg , HDL >40mg .   Expected Outcomes Short Term: Participant states understanding of desired cholesterol values and is compliant with medications prescribed. Participant is following exercise prescription and nutrition guidelines.;Long Term: Cholesterol controlled with medications as prescribed, with individualized exercise RX and with personalized nutrition plan. Value goals: LDL < 70mg , HDL > 40 mg.   Personal Goal Other Yes   Personal Goal Increase knowledge on diabetes and enjoy life   Intervention Provide nutrition counseling and diabetes education to aid in increasing awareness/understanding on diabetes management   Expected Outcomes Pt will have increased knowledge on diabetes and improve overall quality of life.       Personal Goals Discharge:   Nutrition & Weight - Outcomes:     Pre  Biometrics - 01/22/17 1121      Pre Biometrics   Height 5' 3.25" (1.607 m)   Weight 236 lb 12.4 oz (107.4 kg)   Waist Circumference 47 inches   Hip Circumference 49 inches   Waist to Hip Ratio 0.96 %   BMI (Calculated) 41.7   Triceps Skinfold 68 mm   % Body Fat 55.5 %   Grip Strength 30 kg   Flexibility 9.75 in   Single Leg Stand 9.56 seconds         Post Biometrics - 03/18/17 1120       Post  Biometrics   Weight 239 lb 3.2 oz (108.5 kg)      Nutrition:     Nutrition Therapy & Goals - 01/29/17 0941      Nutrition Therapy   Diet Carb Modified, Therapeutic Lifestyle Changes     Personal Nutrition Goals   Nutrition Goal Wt loss of 1-2 lb/week to a wt loss goal of 6-24 lb at graduation from Cardiac Rehab.    Personal Goal #2 Improved glycemic control as evidenced by a decrease in A1c from 7.7 toward less than 7.0     Intervention Plan   Intervention Prescribe, educate and counsel regarding individualized specific dietary modifications aiming towards targeted core components such as weight, hypertension, lipid management, diabetes, heart failure and other comorbidities.   Expected Outcomes Short Term Goal: Understand basic principles of dietary content, such as calories, fat, sodium, cholesterol and nutrients.;Long Term Goal: Adherence to prescribed nutrition plan.      Nutrition Discharge:   Education Questionnaire Score:     Knowledge Questionnaire Score - 01/22/17 0904      Knowledge Questionnaire Score   Pre Score 18/24      Lynn [name] attended 7 exercise sessions. Dr Marin Olp, Mrs Mix's hematologist advised that she not participate in cardiac rehab at this time due to her anemia. Lynn [name] has been discharged from the program at this time. We will be glad to bring Lynn [name] back to complete her sessions at a later date and time when she is able. Lynn [name]  is agreeable to the current plan. Will notify Dr Allison Quarry office as well.[name], RN,BSN 03/23/2017  12:49 PM

## 2017-03-06 ENCOUNTER — Encounter (HOSPITAL_COMMUNITY): Payer: BLUE CROSS/BLUE SHIELD

## 2017-03-06 LAB — ALPHA-THALASSEMIA GENOTYPR

## 2017-03-08 ENCOUNTER — Encounter (HOSPITAL_COMMUNITY): Payer: BLUE CROSS/BLUE SHIELD

## 2017-03-13 ENCOUNTER — Encounter (HOSPITAL_COMMUNITY): Payer: BLUE CROSS/BLUE SHIELD

## 2017-03-15 ENCOUNTER — Encounter (HOSPITAL_COMMUNITY): Payer: BLUE CROSS/BLUE SHIELD

## 2017-03-18 ENCOUNTER — Ambulatory Visit (INDEPENDENT_AMBULATORY_CARE_PROVIDER_SITE_OTHER): Payer: BLUE CROSS/BLUE SHIELD

## 2017-03-18 ENCOUNTER — Encounter (HOSPITAL_COMMUNITY): Payer: BLUE CROSS/BLUE SHIELD

## 2017-03-18 ENCOUNTER — Ambulatory Visit: Payer: BLUE CROSS/BLUE SHIELD | Admitting: Cardiology

## 2017-03-18 DIAGNOSIS — R002 Palpitations: Secondary | ICD-10-CM | POA: Diagnosis not present

## 2017-03-18 HISTORY — PX: OTHER SURGICAL HISTORY: SHX169

## 2017-03-18 NOTE — Addendum Note (Signed)
Encounter addended by: Meta Hatchet on: 03/18/2017  1:53 PM<BR>    Actions taken: Flowsheet data copied forward

## 2017-03-18 NOTE — Addendum Note (Signed)
Encounter addended by: Meta Hatchet on: 03/18/2017 11:39 AM<BR>    Actions taken: Visit Navigator Flowsheet section accepted

## 2017-03-20 ENCOUNTER — Encounter (HOSPITAL_COMMUNITY): Payer: BLUE CROSS/BLUE SHIELD

## 2017-03-22 ENCOUNTER — Encounter (HOSPITAL_COMMUNITY): Payer: BLUE CROSS/BLUE SHIELD

## 2017-03-25 ENCOUNTER — Encounter (HOSPITAL_COMMUNITY): Payer: BLUE CROSS/BLUE SHIELD

## 2017-03-27 ENCOUNTER — Encounter (HOSPITAL_COMMUNITY): Payer: BLUE CROSS/BLUE SHIELD

## 2017-03-29 ENCOUNTER — Encounter (HOSPITAL_COMMUNITY): Payer: BLUE CROSS/BLUE SHIELD

## 2017-04-01 ENCOUNTER — Encounter (HOSPITAL_COMMUNITY): Payer: BLUE CROSS/BLUE SHIELD

## 2017-04-03 ENCOUNTER — Encounter (HOSPITAL_COMMUNITY): Payer: BLUE CROSS/BLUE SHIELD

## 2017-04-05 ENCOUNTER — Encounter (HOSPITAL_COMMUNITY): Payer: BLUE CROSS/BLUE SHIELD

## 2017-04-08 ENCOUNTER — Encounter (HOSPITAL_COMMUNITY): Payer: BLUE CROSS/BLUE SHIELD

## 2017-04-10 ENCOUNTER — Other Ambulatory Visit: Payer: Self-pay | Admitting: Cardiology

## 2017-04-10 ENCOUNTER — Encounter (HOSPITAL_COMMUNITY): Payer: BLUE CROSS/BLUE SHIELD

## 2017-04-12 ENCOUNTER — Encounter (HOSPITAL_COMMUNITY): Payer: BLUE CROSS/BLUE SHIELD

## 2017-04-12 NOTE — Telephone Encounter (Signed)
Rx(s) sent to pharmacy electronically.  

## 2017-04-15 ENCOUNTER — Encounter (HOSPITAL_COMMUNITY): Payer: BLUE CROSS/BLUE SHIELD

## 2017-04-16 ENCOUNTER — Encounter: Payer: Self-pay | Admitting: Family Medicine

## 2017-04-18 ENCOUNTER — Other Ambulatory Visit: Payer: Self-pay | Admitting: Emergency Medicine

## 2017-04-18 DIAGNOSIS — E669 Obesity, unspecified: Principal | ICD-10-CM

## 2017-04-18 DIAGNOSIS — E1169 Type 2 diabetes mellitus with other specified complication: Secondary | ICD-10-CM

## 2017-04-18 MED ORDER — LIRAGLUTIDE 18 MG/3ML ~~LOC~~ SOPN: PEN_INJECTOR | SUBCUTANEOUS | 3 refills | Status: DC

## 2017-04-18 MED ORDER — LOSARTAN POTASSIUM-HCTZ 100-12.5 MG PO TABS: 1.0000 | ORAL_TABLET | Freq: Every day | ORAL | 0 refills | Status: DC

## 2017-04-18 MED ORDER — GLIPIZIDE 5 MG PO TABS: 5.0000 mg | ORAL_TABLET | Freq: Two times a day (BID) | ORAL | 3 refills | Status: DC

## 2017-04-19 ENCOUNTER — Encounter (HOSPITAL_COMMUNITY): Payer: BLUE CROSS/BLUE SHIELD

## 2017-04-22 ENCOUNTER — Encounter (HOSPITAL_COMMUNITY): Payer: BLUE CROSS/BLUE SHIELD

## 2017-04-24 ENCOUNTER — Encounter (HOSPITAL_COMMUNITY): Payer: BLUE CROSS/BLUE SHIELD

## 2017-04-26 ENCOUNTER — Encounter (HOSPITAL_COMMUNITY): Payer: BLUE CROSS/BLUE SHIELD

## 2017-04-29 ENCOUNTER — Encounter (HOSPITAL_COMMUNITY): Payer: BLUE CROSS/BLUE SHIELD

## 2017-05-01 ENCOUNTER — Encounter (HOSPITAL_COMMUNITY): Payer: BLUE CROSS/BLUE SHIELD

## 2017-05-03 ENCOUNTER — Encounter (HOSPITAL_COMMUNITY): Payer: BLUE CROSS/BLUE SHIELD

## 2017-05-06 ENCOUNTER — Encounter (HOSPITAL_COMMUNITY): Payer: BLUE CROSS/BLUE SHIELD

## 2017-05-08 ENCOUNTER — Encounter (HOSPITAL_COMMUNITY): Payer: BLUE CROSS/BLUE SHIELD

## 2017-05-08 ENCOUNTER — Other Ambulatory Visit: Payer: Self-pay | Admitting: Family Medicine

## 2017-05-10 ENCOUNTER — Encounter (HOSPITAL_COMMUNITY): Payer: BLUE CROSS/BLUE SHIELD

## 2017-05-10 ENCOUNTER — Other Ambulatory Visit: Payer: Self-pay | Admitting: Family Medicine

## 2017-05-13 ENCOUNTER — Encounter (HOSPITAL_COMMUNITY): Payer: BLUE CROSS/BLUE SHIELD

## 2017-05-15 ENCOUNTER — Encounter (HOSPITAL_COMMUNITY): Payer: BLUE CROSS/BLUE SHIELD

## 2017-05-17 ENCOUNTER — Encounter (HOSPITAL_COMMUNITY): Payer: BLUE CROSS/BLUE SHIELD

## 2017-05-20 ENCOUNTER — Encounter (HOSPITAL_COMMUNITY): Payer: BLUE CROSS/BLUE SHIELD

## 2017-05-22 ENCOUNTER — Encounter (HOSPITAL_COMMUNITY): Payer: BLUE CROSS/BLUE SHIELD

## 2017-05-24 ENCOUNTER — Encounter (HOSPITAL_COMMUNITY): Payer: BLUE CROSS/BLUE SHIELD

## 2017-05-27 ENCOUNTER — Encounter (HOSPITAL_COMMUNITY): Payer: BLUE CROSS/BLUE SHIELD

## 2017-05-29 ENCOUNTER — Encounter (HOSPITAL_COMMUNITY): Payer: BLUE CROSS/BLUE SHIELD

## 2017-05-31 ENCOUNTER — Encounter (HOSPITAL_COMMUNITY): Payer: BLUE CROSS/BLUE SHIELD

## 2017-06-07 ENCOUNTER — Ambulatory Visit (INDEPENDENT_AMBULATORY_CARE_PROVIDER_SITE_OTHER): Payer: Medicare Other | Admitting: Cardiology

## 2017-06-07 VITALS — BP 120/70 | HR 80 | Ht 63.0 in | Wt 241.8 lb

## 2017-06-07 DIAGNOSIS — E785 Hyperlipidemia, unspecified: Secondary | ICD-10-CM | POA: Diagnosis not present

## 2017-06-07 DIAGNOSIS — I10 Essential (primary) hypertension: Secondary | ICD-10-CM | POA: Diagnosis not present

## 2017-06-07 DIAGNOSIS — I2510 Atherosclerotic heart disease of native coronary artery without angina pectoris: Secondary | ICD-10-CM | POA: Diagnosis not present

## 2017-06-07 DIAGNOSIS — I471 Supraventricular tachycardia: Secondary | ICD-10-CM | POA: Diagnosis not present

## 2017-06-07 MED ORDER — METOPROLOL TARTRATE 25 MG PO TABS: 25.0000 mg | ORAL_TABLET | Freq: Every day | ORAL | 6 refills | Status: DC | PRN

## 2017-06-07 MED ORDER — METOPROLOL TARTRATE 50 MG PO TABS: 50.0000 mg | ORAL_TABLET | Freq: Two times a day (BID) | ORAL | 3 refills | Status: DC

## 2017-06-07 MED ORDER — LOSARTAN POTASSIUM-HCTZ 50-12.5 MG PO TABS: 1.0000 | ORAL_TABLET | Freq: Every day | ORAL | 3 refills | Status: DC

## 2017-06-07 NOTE — Patient Instructions (Signed)
Medication changes  Metoprolol  Tartrate 50 mg  Twice a day    You may use the 25 mg dose on an as needed basis - if you have a fast heartrate as discussed with the doctor   Recommendations for vagal maneuvers:  "Bearing down"  Coughing  Gagging  Icy, cold towel on face or drink ice cold water, if you a fast rate that last for over 15-30 mins.  DECREASE LOSARTAN-HCTZ 50/12.5 MG MAY TAKE 1/2 TABLET OF CURRENT BOTTLE OF MEDICATION UNTIL EMPTY.      Your physician recommends that you schedule a follow-up appointment in Dec 2018 with DR HARDING.   If you need a refill on your cardiac medications before your next appointment, please call your pharmacy.

## 2017-06-07 NOTE — Progress Notes (Signed)
PCP: Lynn Mclean, MD  Clinic Note: Chief Complaint  Patient presents with  . Follow-up    palpitations - PAT/SVT    HPI: Lynn [name] is a 61 y.o. female with a PMH below who presents today for three-month follow-up cardiac risk risk factors. She has nonobstructive coronary disease but suffered positive troponins/non-STEMI in February 2018. Cardiac catheterization did not show any obstructive disease. She was placed on Imdur chest discomfort for possiblecoronary spasm. - I have not seen her back since that hospitalization at the time of her catheterization.  Lynn [name] was last seen on 02/28/2017 by Lynn Hodgkins, NP for post hospital follow-up for emergency room visit for dizziness associated with diarrhea/dehydration. At that time Imdur was discontinued. Mr. Lynn [name] noted that she has still had exertional dyspnea carrying a load upstairs for an opening Lynn [name] she was still doing cardiac rehabilitation. Although trying to lose weight had not been successful. Note intermittent palpitations as a fluttering sensation in her chest lasting 10-15 minutes.she was doing well not being on the Imdur with no further chest pain. Remain on aspirin, statin, beta blocker and ARB. A 48-hour monitor was ordered.  Recent Hospitalizations: none since last visit  Studies Personally Reviewed - (if available, images/films reviewed: From Epic Chart or Care Everywhere)  48-hour monitor 03/19/2079: Sinus rhythm with sinus tachycardia (rate 58-134 BPM)multiple PVCs noted with couplets and bigeminy. One triplet. 7 runs of PAT ranging from 100-130 bpm. Longest was 33 beats.  2-D echocardiogram 11/17/2016:normal LV size, thickness and function. EF 55-60%. GR 1 DD.No RWMA severe mitral annular calcification, but no mitral stenosis. Mild LA dilation  Cardiac Catheterization February 2018: moderate headache or calcification, but no obstructive disease. 40 &50%lesions in proximal and mid LAD. 30-50%  proximal and mid RCA. The patient was noted to have runs of SVT during the procedure. (See diagram below)  Interval History: Lynn [name] returns today for follow-up. She did have quite a few episodes of palpitations while wearing a monitor, and continues to have episodes with at least 3 in the last couple weeks that have been notably longer than was noted on monitor.. She said actually the day after she turned a monitor and check a prolonged episode. Nothing lasting more than 15 or 20 minutes, and she has not had any syncopal symptoms. She has had so near syncope. She really has not noted any chest discomfort with rest or exertion. Imdur was stopped because of concern for orthostatic symptoms back in May, and she has not had any recurrent anginal type symptoms since. She still notices exertional dyspnea, but is hoping to work on losing weight.  No PND, orthopnea or edema. No weakness or syncope/near syncope. No TIA/amaurosis fugax symptoms.  ROS: A comprehensive was performed -pertinent symptoms noted above. Otherwise negative. Review of Systems  Constitutional: Negative for malaise/fatigue.  HENT: Negative for congestion and nosebleeds.   Respiratory: Negative for cough and wheezing.   Cardiovascular: Negative for claudication.  Gastrointestinal: Negative for blood in stool and melena.  Genitourinary: Negative for hematuria.  Musculoskeletal:       Chronic arthritis pains.  Endo/Heme/Allergies: Negative for environmental allergies.  Psychiatric/Behavioral: The patient is nervous/anxious (she does have some anxiety, but notably better than I saw her last.).   All other systems reviewed and are negative.   I have reviewed and (if needed) personally updated the patient's problem list, medications, allergies, past medical and surgical history, social and family history.   Past Medical History:  Diagnosis Date  . Arthritis   .  Clotting disorder ([place])    blood clot in eye 2016  . Coronary  artery disease, non-occlusive    a. 11/2016 NSTEMI/Cath: LM nl, LAD 40p, 11m, D1/2 small, LCX 40ost, OM2/3 nl/small, RCA 35 ost/mid, RPDA/RPL small/nl.  . Diabetes mellitus without complication ([facility]   . Diastolic dysfunction    a. 11/2016 Echo: EF 55-60%, gr1 DD, sev calcified MV annulus, mildly dil LA.  .Lynn [name] hemorrhage 4/16   resolved rt  . Hyperlipidemia   . Hypertension   . Microcytic anemia   . Sleep apnea    use CPAP  . Thalassemia   . Ulcer     Past Surgical History:  Procedure Laterality Date  . 48-Hour Monitor  03/18/2017   Sinus rhythm with sinus tachycardia (rate 58-134 BPM)multiple PVCs noted with couplets and bigeminy. One triplet. 7 runs of PAT ranging from 100-130 bpm. Longest was 33 beats.  . ABDOMINAL HYSTERECTOMY    . arthroscopic knee Left 2-16  . CARDIAC CATHETERIZATION N/A 11/19/2016   Procedure: Left Heart Cath and Coronary Angiography;  Surgeon: HBelva Crome MD;  [facility];  Service: Cardiovascular.    LM nl, LAD 40p, 544m D1/2 small, LCX 40ost, OM2/3 nl/small, RCA 35 ost/mid, RPDA/RPL small/nl.  . CARPAL TUNNEL RELEASE Right 11/01/2014  . COLONOSCOPY    . JOINT REPLACEMENT    . TONSILLECTOMY    . TOTAL KNEE ARTHROPLASTY Right 02/18/2015   Procedure: RIGHT TOTAL KNEE ARTHROPLASTY;  Surgeon: Lynn [name];  [facility] Service: Orthopedics;  Laterality: Right;  . TOTAL KNEE ARTHROPLASTY Left 08/19/2015   Procedure: LEFT TOTAL KNEE ARTHROPLASTY;  Surgeon: Lynn [name];  [facility] Service: [facility]ics;  Laterality: Left;  . TRANSTHORACIC ECHOCARDIOGRAM  11/2016   :normal LV size, thickness and function. EF 55-60%. GR 1 DD.No RWMA severe mitral annular calcification, but no mitral stenosis. Mild LA dilation   Diagnostic Diagram - [facility] 11/2016    Current Meds  Medication Sig  . aspirin 81 MG chewable tablet Chew 1 tablet (81 mg total) by mouth daily.  . Lynn Kitchentorvastatin (LIPITOR) 80 MG tablet Take 1 tablet (80 mg total) by  mouth daily at 6 PM.  . blood glucose meter kit and supplies KIT Use up to four times daily as directed.Dx: E11.9  . Blood Glucose Monitoring Suppl (ACCU-CHEK AVIVA PLUS) W/DEVICE KIT   . cetirizine (ZYRTEC) 10 MG tablet Take 10 mg by mouth daily as needed for allergies.  . folic acid (FOLVITE) 1 MG tablet Take 2 tablets (2 mg total) by mouth daily.  . Lynn KitchenlipiZIDE (GLUCOTROL) 5 MG tablet Take 1 tablet (5 mg total) by mouth 2 (two) times daily before a meal.  . glucose blood test strip Use up to four times daily as directly. Dx E11.9  . Insulin Pen Needle (PEN NEEDLES) 31G X 5 MM MISC 1 Units by Does not apply route daily. For use with victoza pen  . liraglutide (VICTOZA) 18 MG/3ML SOPN START WITH 0.6 MG INJECTED  DAILY FOR ONE WEEK, THEN INCREASE TO 1.2 MG DAILY (Patient taking differently: Inject 1.2 mg into the skin daily. )  . metoprolol tartrate (LOPRESSOR) 25 MG tablet Take 1 tablet (25 mg total) by mouth daily as needed. If break thorough fast heart beat occur  . Multiple Vitamin (MULTIVITAMIN WITH MINERALS) TABS tablet Take 1 tablet by mouth daily. Reported on 01/03/2016  . nitroGLYCERIN (NITROSTAT) 0.4 MG SL tablet PLACE 1 TABLET UNDER THE TONGUE EVERY 5MIN AS  NEEDED FOR 3 DOSES FOR CHEST PAIN  . PRESCRIPTION MEDICATION Inhale into the lungs at bedtime. CPAP  . [DISCONTINUED] acetaminophen (TYLENOL) 325 MG tablet Take 650 mg by mouth every 6 (six) hours as needed for mild pain.  . [DISCONTINUED] losartan-hydrochlorothiazide (HYZAAR) 100-12.5 MG tablet TAKE 1 TABLET BY MOUTH DAILY WITH SUPPER.  . [DISCONTINUED] metoprolol tartrate (LOPRESSOR) 25 MG tablet Take 1 tablet (25 mg total) by mouth 2 (two) times daily.    Allergies  Allergen Reactions  . Penicillins Hives    Has patient had a PCN reaction causing immediate rash, facial/tongue/throat swelling, SOB or lightheadedness with hypotension: Yes Has patient had a PCN reaction causing severe rash involving mucus membranes or skin  necrosis: No Has patient had a PCN reaction that required hospitalization No Has patient had a PCN reaction occurring within the last 10 years: No If all of the above answers are "NO", then may proceed with Cephalosporin use.    Social History   Social History  . Marital status: Married    Spouse name: N/A  . Number of children: N/A  . Years of education: N/A   Social History Main Topics  . Smoking status: Former Smoker    Packs/day: 0.25    Years: 44.00    Types: Cigarettes    Quit date: 02/17/2015  . Smokeless tobacco: Never Used  . Alcohol use 0.0 oz/week     Comment: socially  . Drug use: No  . Sexual activity: Not Asked   Other Topics Concern  . None   Social History Narrative  . None    family history includes Cancer in her father and mother; Diabetes in her maternal grandfather, maternal grandmother, mother, and sister; Hyperlipidemia in her father and mother; Hypertension in her mother; Mental illness in her sister.  Wt Readings from Last 3 Encounters:  06/07/17 241 lb 12.8 oz (109.7 kg)  02/28/17 241 lb 9.6 oz (109.6 kg)  02/26/17 238 lb 1.9 oz (108 kg)    PHYSICAL EXAM BP 120/70   Pulse 80   Ht '5\' 3"'$  (1.6 m)   Wt 241 lb 12.8 oz (109.7 kg)   SpO2 95%   BMI 42.83 kg/m  Physical Exam  Constitutional: She is oriented to person, place, and time. She appears well-developed and well-nourished. No distress.  Morbidly obese  HENT:  Head: Normocephalic and atraumatic.  Neck: Normal range of motion. Neck supple. No hepatojugular reflux and no JVD present. Carotid bruit is not present.  Cardiovascular: Normal rate, regular rhythm, normal heart sounds and intact distal pulses.  Exam reveals no gallop and no friction rub.   No murmur heard. Pulmonary/Chest: Effort normal. No respiratory distress. She has no decreased breath sounds (distant breath sounds, but not diminished). She has no wheezes. She has no rales.  Abdominal: Soft. Bowel sounds are normal. She  exhibits no distension. There is no tenderness.  Musculoskeletal: Normal range of motion. She exhibits no edema.  Neurological: She is alert and oriented to person, place, and time.  Skin: Skin is warm and dry.  Psychiatric: She has a normal mood and affect. Her behavior is normal. Judgment and thought content normal.  Nursing note and vitals reviewed.   Adult ECG Report  none  Other studies Reviewed: Additional studies/ records that were reviewed today include:  Recent Labs: . Lab Results  Component Value Date   CHOL 171 11/17/2016   HDL 40 (L) 11/17/2016   LDLCALC 99 11/17/2016   LDLDIRECT 131 (H) 09/02/2013  TRIG 161 (H) 11/17/2016   CHOLHDL 4.3 11/17/2016     ASSESSMENT / PLAN: Problem List Items Addressed This Visit    Coronary artery disease, non-occlusive (Chronic)    Moderate nonobstructive disease noted on cardiac catheterization. No further real anginal symptoms. She is on aspirin, and statin. Also on beta blocker and ARB.  Imdur discontinued because of concern for orthostasis. Since she is not having any further symptoms, would not restart.      Relevant Medications   metoprolol tartrate (LOPRESSOR) 25 MG tablet   metoprolol tartrate (LOPRESSOR) 50 MG tablet   losartan-hydrochlorothiazide (HYZAAR) 50-12.5 MG tablet   Essential hypertension (Chronic)    Ell-controlled with current meds. However with increasing beta blocker dose only to reduce ARB dose to avoid hypotension. WILL CUT ARB dose in half to avoid hypotension in setting of doubling beta blocker.      Relevant Medications   metoprolol tartrate (LOPRESSOR) 25 MG tablet   metoprolol tartrate (LOPRESSOR) 50 MG tablet   losartan-hydrochlorothiazide (HYZAAR) 50-12.5 MG tablet   Hyperlipidemia with target low density lipoprotein (LDL) cholesterol less than 100 mg/dL (Chronic)    Target goal LDL should definitely be less than 100, closer to 70.not currently at goal based on her labs in February. She will  need to have repeat labs checked while continuing statin.      Relevant Medications   metoprolol tartrate (LOPRESSOR) 25 MG tablet   metoprolol tartrate (LOPRESSOR) 50 MG tablet   losartan-hydrochlorothiazide (HYZAAR) 50-12.5 MG tablet   PAT (paroxysmal atrial tachycardia) (HCC) - Primary (Chronic)    Her rhythm strips on monitor. To be PAT, but the symptoms he described probably more consistent with PSVT. She is still noticing several episodes, but is not overly symptomatic. At this time I think treatment with increased dose of beta blocker should suffice. We also discussed vagal maneuvers.  Plan: increased beta blocker to 50 mg twice a day with the ability to use additional 25 mg as needed.      Relevant Medications   metoprolol tartrate (LOPRESSOR) 25 MG tablet   metoprolol tartrate (LOPRESSOR) 50 MG tablet   losartan-hydrochlorothiazide (HYZAAR) 50-12.5 MG tablet      Current medicines are reviewed at length with the patient today. (+/- concerns) n/a The following changes have been made: increase BB & decrease ARB-HCTZ.  Patient Instructions  Medication changes  Metoprolol  Tartrate 50 mg  Twice a day    You may use the 25 mg dose on an as needed basis - if you have a fast heartrate as discussed with the doctor   Recommendations for vagal maneuvers:  "Bearing down"  Coughing  Gagging  Icy, cold towel on face or drink ice cold water, if you a fast rate that last for over 15-30 mins.  DECREASE LOSARTAN-HCTZ 50/12.5 MG MAY TAKE 1/2 TABLET OF CURRENT BOTTLE OF MEDICATION UNTIL EMPTY.      Your physician recommends that you schedule a follow-up appointment in Dec 2018 with DR HARDING.   If you need a refill on your cardiac medications before your next appointment, please call your pharmacy.     Studies Ordered:   No orders of the defined types were placed in this encounter.     Glenetta Hew, M.D., M.S. Interventional Cardiologist   Pager #  [phone] Phone # [phone] [address] [place], [place] [address]

## 2017-06-09 ENCOUNTER — Encounter: Payer: Self-pay | Admitting: Cardiology

## 2017-06-09 NOTE — Assessment & Plan Note (Signed)
Target goal LDL should definitely be less than 100, closer to 70.not currently at goal based on her labs in February. She will need to have repeat labs checked while continuing statin.

## 2017-06-09 NOTE — Assessment & Plan Note (Signed)
Moderate nonobstructive disease noted on cardiac catheterization. No further real anginal symptoms. She is on aspirin, and statin. Also on beta blocker and ARB.  Imdur discontinued because of concern for orthostasis. Since she is not having any further symptoms, would not restart.

## 2017-06-09 NOTE — Assessment & Plan Note (Signed)
Ell-controlled with current meds. However with increasing beta blocker dose only to reduce ARB dose to avoid hypotension. WILL CUT ARB dose in half to avoid hypotension in setting of doubling beta blocker.

## 2017-06-09 NOTE — Assessment & Plan Note (Signed)
Her rhythm strips on monitor. To be PAT, but the symptoms he described probably more consistent with PSVT. She is still noticing several episodes, but is not overly symptomatic. At this time I think treatment with increased dose of beta blocker should suffice. We also discussed vagal maneuvers.  Plan: increased beta blocker to 50 mg twice a day with the ability to use additional 25 mg as needed.

## 2017-06-10 ENCOUNTER — Other Ambulatory Visit: Payer: Self-pay | Admitting: Family Medicine

## 2017-06-11 NOTE — Progress Notes (Addendum)
[facility] [place], [place], [place] [address] [phone] [id number]  Date:  06/12/2017   Name:  Lynn [name]   DOB:  03/29/1956   MRN:  [id number]  PCP:  [name], MD    Chief Complaint: Follow-up   History of Present Illness:  Montrice Montuori is a 61 y.o. very pleasant female patient who presents with the following:  History of CAD, NSTEMI earlier this year, DM, HTN, obesity, thalassemia, obesity, dyslipidemia Here today for a recheck visit Due for a foot exam and A1c Flu shot:pt iwll done done a little later in the season  Lat visit with me in April of this year She had a good summer She is still seeing cardiology on a regular basis- Dr. Ellyn Hack She would like a cholesterol check today She is fasting today except for some toast and a small glass of OJ  She is taking lipitor 80 mg, she is tolerating this well.  She needs a refill of this today  She has been concerned about her blood sugars, notes that she will be up and down, fluctuates between 130- 170 am, and may spike up higher during the day.  Some of her morning sugars are  She is on glipizide, victoza at this time,  She has not been able to tolerate metformin She did very well with insulin when she used it in the hospital in the past   She has not tolerated metformin due to diarrhea and rectal bleeding.  She did have a colonoscopy which was ok- her GI doctor asked her to stop the metformin. Since she stopped this her GI sx resolved  Lab Results  Component Value Date   HGBA1C 7.7 (H) 11/17/2016   Eye exam: reminded to do this soon.    Patient Active Problem List   Diagnosis Date Noted  . Coronary artery disease, non-occlusive 12/08/2016  . PAT (paroxysmal atrial tachycardia) ([place])   . Non-STEMI (non-ST elevated myocardial infarction) ([place]) 11/16/2016  . H/O total knee replacement 08/19/2015  . Vision, loss, sudden 03/07/2015  . S/P TKR  (total knee replacement) using cement 02/18/2015  . Thalassemia 09/02/2013  . Hyperlipidemia with target low density lipoprotein (LDL) cholesterol less than 100 mg/dL 08/24/2012  . Diabetes mellitus ([place]) 07/22/2012  . Essential hypertension 07/22/2012    Past Medical History:  Diagnosis Date  . Arthritis   . Clotting disorder ([place])    blood clot in eye 2016  . Coronary artery disease, non-occlusive    a. 11/2016 NSTEMI/Cath: LM nl, LAD 40p, 36m, D1/2 small, LCX 40ost, OM2/3 nl/small, RCA 35 ost/mid, RPDA/RPL small/nl.  . Diabetes mellitus without complication [place]   . Diastolic dysfunction    a. 11/2016 Echo: EF 55-60%, gr1 DD, sev calcified MV annulus, mildly dil LA.  .Marland KitchenEye hemorrhage 4/16   resolved rt  . Hyperlipidemia   . Hypertension   . Microcytic anemia   . Sleep apnea    use CPAP  . Thalassemia   . Ulcer     Past Surgical History:  Procedure Laterality Date  . 48-Hour Monitor  03/18/2017   Sinus rhythm with sinus tachycardia (rate 58-134 BPM)multiple PVCs noted with couplets and bigeminy. One triplet. 7 runs of PAT ranging from 100-130 bpm. Longest was 33 beats.  . ABDOMINAL HYSTERECTOMY    . arthroscopic knee Left 2-16  . CARDIAC CATHETERIZATION N/A 11/19/2016   Procedure: Left Heart Cath and Coronary Angiography;  Surgeon: Belva Crome, MD;  Location: [facility];  Service: Cardiovascular.    LM nl, LAD 40p, 43m, D1/2 small, LCX 40ost, OM2/3 nl/small, RCA 35 ost/mid, RPDA/RPL small/nl.  . CARPAL TUNNEL RELEASE Right 11/01/2014  . COLONOSCOPY    . JOINT REPLACEMENT    . TONSILLECTOMY    . TOTAL KNEE ARTHROPLASTY Right 02/18/2015   Procedure: RIGHT TOTAL KNEE ARTHROPLASTY;  Surgeon: SNetta Cedars MD;  Location: [facility]  Service: Orthopedics;  Laterality: Right;  . TOTAL KNEE ARTHROPLASTY Left 08/19/2015   Procedure: LEFT TOTAL KNEE ARTHROPLASTY;  Surgeon: SNetta Cedars MD;  Location: [facility]  Service: Orthopedics;  Laterality: Left;  . TRANSTHORACIC  ECHOCARDIOGRAM  11/2016   :normal LV size, thickness and function. EF 55-60%. GR 1 DD.No RWMA severe mitral annular calcification, but no mitral stenosis. Mild LA dilation    Social History  Substance Use Topics  . Smoking status: Former Smoker    Packs/day: 0.25    Years: 44.00    Types: Cigarettes    Quit date: 02/17/2015  . Smokeless tobacco: Never Used  . Alcohol use 0.0 oz/week     Comment: socially    Family History  Problem Relation Age of Onset  . Cancer Mother   . Diabetes Mother   . Hypertension Mother   . Hyperlipidemia Mother   . Cancer Father   . Hyperlipidemia Father   . Mental illness Sister   . Diabetes Sister   . Diabetes Maternal Grandmother   . Diabetes Maternal Grandfather   . Colon cancer Neg Hx   . Esophageal cancer Neg Hx   . Stomach cancer Neg Hx   . Rectal cancer Neg Hx     Allergies  Allergen Reactions  . Penicillins Hives    Has patient had a PCN reaction causing immediate rash, facial/tongue/throat swelling, SOB or lightheadedness with hypotension: Yes Has patient had a PCN reaction causing severe rash involving mucus membranes or skin necrosis: No Has patient had a PCN reaction that required hospitalization No Has patient had a PCN reaction occurring within the last 10 years: No If all of the above answers are "NO", then may proceed with Cephalosporin use.  . Metformin And Related     Diarrhea, bleeding    Medication list has been reviewed and updated.  Current Outpatient Prescriptions on File Prior to Visit  Medication Sig Dispense Refill  . aspirin 81 MG chewable tablet Chew 1 tablet (81 mg total) by mouth daily. 30 tablet   . atorvastatin (LIPITOR) 80 MG tablet Take 1 tablet (80 mg total) by mouth daily at 6 PM. 30 tablet 6  . blood glucose meter kit and supplies KIT Use up to four times daily as directed.Dx: E11.9 1 each 0  . Blood Glucose Monitoring Suppl (ACCU-CHEK AVIVA PLUS) W/DEVICE KIT     . cetirizine (ZYRTEC) 10 MG tablet  Take 10 mg by mouth daily as needed for allergies.    . folic acid (FOLVITE) 1 MG tablet Take 2 tablets (2 mg total) by mouth daily. 180 tablet 4  . glipiZIDE (GLUCOTROL) 5 MG tablet Take 1 tablet (5 mg total) by mouth 2 (two) times daily before a meal. 180 tablet 3  . glucose blood test strip Use up to four times daily as directly. Dx E11.9 400 each 3  . Insulin Pen Needle (PEN NEEDLES) 31G X 5 MM MISC 1 Units by Does not apply route daily. For use with victoza pen 100 each 3  .  liraglutide (VICTOZA) 18 MG/3ML SOPN START WITH 0.6 MG INJECTED [place] DAILY FOR ONE WEEK, THEN INCREASE TO 1.2 MG DAILY (Patient taking differently: Inject 1.2 mg into the skin daily. ) 6 mL 3  . losartan-hydrochlorothiazide (HYZAAR) 50-12.5 MG tablet Take 1 tablet by mouth daily. 90 tablet 3  . metoprolol tartrate (LOPRESSOR) 25 MG tablet Take 1 tablet (25 mg total) by mouth daily as needed. If break thorough fast heart beat occur 30 tablet 6  . metoprolol tartrate (LOPRESSOR) 50 MG tablet Take 1 tablet (50 mg total) by mouth 2 (two) times daily. 180 tablet 3  . Multiple Vitamin (MULTIVITAMIN WITH MINERALS) TABS tablet Take 1 tablet by mouth daily. Reported on 01/03/2016    . PRESCRIPTION MEDICATION Inhale into the lungs at bedtime. CPAP    . nitroGLYCERIN (NITROSTAT) 0.4 MG SL tablet PLACE 1 TABLET UNDER THE TONGUE EVERY 5MIN AS NEEDED FOR 3 DOSES FOR CHEST PAIN (Patient not taking: Reported on 06/12/2017) 25 tablet 3   No current facility-administered medications on file prior to visit.     Review of Systems:  As per HPI- otherwise negative. No fever or chills No CP or SOB No nausea, vomiting or diarrhea She has noted some of her seasonal allergies and used some claritin    Physical Examination: Vitals:   06/12/17 1221  BP: 132/82  Pulse: 81  Resp: 14  Temp: 98.8 F (37.1 C)  SpO2: 96%   Vitals:   06/12/17 1221  Weight: 242 lb (109.8 kg)  Height: '5\' 3"'$  (1.6 m)   Body mass index is 42.87 kg/m. Ideal  Body Weight: Weight in (lb) to have BMI = 25: 140.8  GEN: WDWN, NAD, Non-toxic, A & O x 3, obese, otherwise looks well HEENT: Atraumatic, Normocephalic. Neck supple. No masses, No LAD. Ears and Nose: No external deformity. CV: RRR, No M/G/R. No JVD. No thrill. No extra heart sounds. PULM: CTA B, no wheezes, crackles, rhonchi. No retractions. No resp. distress. No accessory muscle use. ABD: S, NT, ND, +BS. No rebound. No HSM. EXTR: No c/c/e NEURO Normal gait.  PSYCH: Normally interactive. Conversant. Not depressed or anxious appearing.  Calm demeanor.    Assessment and Plan: Diabetes mellitus type 2 in obese ([place]) - Plan: Hemoglobin X3A, Basic metabolic panel  Essential hypertension  Class 3 obesity with serious comorbidity in adult, unspecified BMI, unspecified obesity type  NSTEMI (non-ST elevated myocardial infarction) ([place]) - Plan: atorvastatin (LIPITOR) 80 MG tablet  Thalassemia trait  Hyperlipidemia associated with type 2 diabetes mellitus ([place]) - Plan: atorvastatin (LIPITOR) 80 MG tablet, Lipid panel  BP controlled Recheck labs as above- may need to adjust her DM regimen as she had to stop metformin  She will get her flu shot later on this year Will plan further follow- up pending labs.  Signed Lamar Blinks, MD  Received her labs 8/31 A1c is too high- need to add a medication She is on glipizide and victoza Cholesterol looks good Called her-discussed adding insulin vs an SGLT2- she would like to try farxiga first, if not at goal then can think about insulin She will work on diet/ exercise and see me in 3 months   Results for orders placed or performed in visit on 06/12/17  Hemoglobin A1c  Result Value Ref Range   Hgb A1c MFr Bld 11.0 (H) 4.6 - 6.5 %  Lipid panel  Result Value Ref Range   Cholesterol 99 0 - 200 mg/dL   Triglycerides 97.0 0.0 - 149.0 mg/dL  HDL 38.30 (L) >39.00 mg/dL   VLDL 19.4 0.0 - 40.0 mg/dL   LDL Cholesterol 41 0 - 99 mg/dL   Total  CHOL/HDL Ratio 3    NonHDL 62.03   Basic metabolic panel  Result Value Ref Range   Sodium 136 135 - 145 mEq/L   Potassium 3.8 3.5 - 5.1 mEq/L   Chloride 100 96 - 112 mEq/L   CO2 28 19 - 32 mEq/L   Glucose, Bld 325 (H) 70 - 99 mg/dL   BUN 17 6 - 23 mg/dL   Creatinine, Ser 0.89 0.40 - 1.20 mg/dL   Calcium 9.7 8.4 - 10.5 mg/dL   GFR 82.28 >60.00 mL/min

## 2017-06-12 ENCOUNTER — Ambulatory Visit (INDEPENDENT_AMBULATORY_CARE_PROVIDER_SITE_OTHER): Payer: Medicare Other | Admitting: Family Medicine

## 2017-06-12 ENCOUNTER — Encounter: Payer: Self-pay | Admitting: Family Medicine

## 2017-06-12 VITALS — BP 132/82 | HR 81 | Temp 98.8°F | Resp 14 | Ht 63.0 in | Wt 242.0 lb

## 2017-06-12 DIAGNOSIS — I10 Essential (primary) hypertension: Secondary | ICD-10-CM | POA: Diagnosis not present

## 2017-06-12 DIAGNOSIS — E785 Hyperlipidemia, unspecified: Secondary | ICD-10-CM | POA: Diagnosis not present

## 2017-06-12 DIAGNOSIS — D563 Thalassemia minor: Secondary | ICD-10-CM | POA: Diagnosis not present

## 2017-06-12 DIAGNOSIS — E669 Obesity, unspecified: Secondary | ICD-10-CM

## 2017-06-12 DIAGNOSIS — IMO0001 Reserved for inherently not codable concepts without codable children: Secondary | ICD-10-CM

## 2017-06-12 DIAGNOSIS — E1169 Type 2 diabetes mellitus with other specified complication: Secondary | ICD-10-CM

## 2017-06-12 DIAGNOSIS — I214 Non-ST elevation (NSTEMI) myocardial infarction: Secondary | ICD-10-CM | POA: Diagnosis not present

## 2017-06-12 LAB — BASIC METABOLIC PANEL
BUN: 17 mg/dL (ref 6–23)
CO2: 28 mEq/L (ref 19–32)
Calcium: 9.7 mg/dL (ref 8.4–10.5)
Chloride: 100 mEq/L (ref 96–112)
Creatinine, Ser: 0.89 mg/dL (ref 0.40–1.20)
GFR: 82.28 mL/min (ref >60.00)
Glucose, Bld: 325 mg/dL — ABNORMAL HIGH (ref 70–99)
Potassium: 3.8 mEq/L (ref 3.5–5.1)
Sodium: 136 mEq/L (ref 135–145)

## 2017-06-12 LAB — LIPID PANEL
Cholesterol: 99 mg/dL (ref 0–200)
HDL: 38.3 mg/dL — ABNORMAL LOW (ref >39.00)
LDL Cholesterol: 41 mg/dL (ref 0–99)
NonHDL: 60.36
Total CHOL/HDL Ratio: 3
Triglycerides: 97 mg/dL (ref 0.0–149.0)
VLDL: 19.4 mg/dL (ref 0.0–40.0)

## 2017-06-12 LAB — HEMOGLOBIN A1C: Hgb A1c MFr Bld: 11 % — ABNORMAL HIGH (ref 4.6–6.5)

## 2017-06-12 MED ORDER — ATORVASTATIN CALCIUM 80 MG PO TABS: 80.0000 mg | ORAL_TABLET | Freq: Every day | ORAL | 3 refills | Status: DC

## 2017-06-12 NOTE — Patient Instructions (Signed)
I will be in touch with your lab results asap We refilled your lipitor today If your A1c is still high we can make a plan to add a diabetes medication- we have a few options that we can consider

## 2017-06-14 ENCOUNTER — Encounter: Payer: Self-pay | Admitting: Family Medicine

## 2017-06-14 MED ORDER — DAPAGLIFLOZIN PROPANEDIOL 5 MG PO TABS: 5.0000 mg | ORAL_TABLET | Freq: Every day | ORAL | 6 refills | Status: DC

## 2017-06-14 NOTE — Addendum Note (Signed)
Addended by: Lamar Blinks C on: 06/14/2017 04:41 PM   Modules accepted: Orders

## 2017-06-18 ENCOUNTER — Telehealth: Payer: Self-pay

## 2017-06-18 NOTE — Telephone Encounter (Signed)
PA initiated via Covermymeds; KEY: [id number]. Awaiting determination.

## 2017-06-19 NOTE — Telephone Encounter (Signed)
PA denied for Farxiga 5mg . Alternative formulary medications: Invokana and Jardiance. Please advise.

## 2017-06-20 ENCOUNTER — Encounter: Payer: Self-pay | Admitting: Family Medicine

## 2017-06-20 MED ORDER — CANAGLIFLOZIN 100 MG PO TABS: 100.0000 mg | ORAL_TABLET | Freq: Every day | ORAL | 6 refills | Status: DC

## 2017-06-20 NOTE — Telephone Encounter (Signed)
Ok made substitution and let her know that I have rx invokana instead   Meds ordered this encounter  Medications  . canagliflozin (INVOKANA) 100 MG TABS tablet    Sig: Take 1 tablet (100 mg total) by mouth daily before breakfast.    Dispense:  30 tablet    Refill:  6

## 2017-06-22 MED ORDER — INSULIN GLARGINE 100 UNIT/ML SOLOSTAR PEN: PEN_INJECTOR | SUBCUTANEOUS | 11 refills | Status: DC

## 2017-06-22 NOTE — Addendum Note (Signed)
Addended by: Lamar Blinks C on: 06/22/2017 03:34 PM   Modules accepted: Orders

## 2017-07-03 ENCOUNTER — Emergency Department (HOSPITAL_COMMUNITY): Payer: Medicare Other

## 2017-07-03 ENCOUNTER — Encounter (HOSPITAL_COMMUNITY): Payer: Self-pay | Admitting: *Deleted

## 2017-07-03 ENCOUNTER — Observation Stay (HOSPITAL_COMMUNITY)
Admission: EM | Admit: 2017-07-03 | Discharge: 2017-07-05 | Disposition: A | Discharge status: Home / Self Care | Payer: Medicare Other | Attending: Internal Medicine | Admitting: Internal Medicine

## 2017-07-03 DIAGNOSIS — R55 Syncope and collapse: Secondary | ICD-10-CM | POA: Diagnosis not present

## 2017-07-03 DIAGNOSIS — I10 Essential (primary) hypertension: Secondary | ICD-10-CM | POA: Diagnosis present

## 2017-07-03 DIAGNOSIS — I2510 Atherosclerotic heart disease of native coronary artery without angina pectoris: Secondary | ICD-10-CM | POA: Diagnosis not present

## 2017-07-03 DIAGNOSIS — E1165 Type 2 diabetes mellitus with hyperglycemia: Secondary | ICD-10-CM | POA: Insufficient documentation

## 2017-07-03 DIAGNOSIS — I471 Supraventricular tachycardia: Secondary | ICD-10-CM | POA: Diagnosis not present

## 2017-07-03 DIAGNOSIS — G4733 Obstructive sleep apnea (adult) (pediatric): Secondary | ICD-10-CM | POA: Insufficient documentation

## 2017-07-03 DIAGNOSIS — R531 Weakness: Secondary | ICD-10-CM | POA: Insufficient documentation

## 2017-07-03 DIAGNOSIS — IMO0002 Reserved for concepts with insufficient information to code with codable children: Secondary | ICD-10-CM

## 2017-07-03 DIAGNOSIS — Z79899 Other long term (current) drug therapy: Secondary | ICD-10-CM | POA: Insufficient documentation

## 2017-07-03 DIAGNOSIS — Z87891 Personal history of nicotine dependence: Secondary | ICD-10-CM | POA: Diagnosis not present

## 2017-07-03 DIAGNOSIS — Z888 Allergy status to other drugs, medicaments and biological substances status: Secondary | ICD-10-CM | POA: Insufficient documentation

## 2017-07-03 DIAGNOSIS — Z96653 Presence of artificial knee joint, bilateral: Secondary | ICD-10-CM | POA: Diagnosis not present

## 2017-07-03 DIAGNOSIS — E785 Hyperlipidemia, unspecified: Secondary | ICD-10-CM | POA: Insufficient documentation

## 2017-07-03 DIAGNOSIS — Z6841 Body Mass Index (BMI) 40.0 and over, adult: Secondary | ICD-10-CM | POA: Insufficient documentation

## 2017-07-03 DIAGNOSIS — N179 Acute kidney failure, unspecified: Secondary | ICD-10-CM | POA: Insufficient documentation

## 2017-07-03 DIAGNOSIS — I493 Ventricular premature depolarization: Secondary | ICD-10-CM | POA: Insufficient documentation

## 2017-07-03 DIAGNOSIS — Z7982 Long term (current) use of aspirin: Secondary | ICD-10-CM | POA: Insufficient documentation

## 2017-07-03 DIAGNOSIS — M1990 Unspecified osteoarthritis, unspecified site: Secondary | ICD-10-CM | POA: Insufficient documentation

## 2017-07-03 DIAGNOSIS — I252 Old myocardial infarction: Secondary | ICD-10-CM | POA: Diagnosis not present

## 2017-07-03 DIAGNOSIS — F419 Anxiety disorder, unspecified: Secondary | ICD-10-CM

## 2017-07-03 DIAGNOSIS — R002 Palpitations: Secondary | ICD-10-CM | POA: Diagnosis not present

## 2017-07-03 DIAGNOSIS — I110 Hypertensive heart disease with heart failure: Secondary | ICD-10-CM | POA: Insufficient documentation

## 2017-07-03 DIAGNOSIS — Z23 Encounter for immunization: Secondary | ICD-10-CM | POA: Insufficient documentation

## 2017-07-03 DIAGNOSIS — E0800 Diabetes mellitus due to underlying condition with hyperosmolarity without nonketotic hyperglycemic-hyperosmolar coma (NKHHC): Secondary | ICD-10-CM

## 2017-07-03 DIAGNOSIS — R404 Transient alteration of awareness: Secondary | ICD-10-CM

## 2017-07-03 DIAGNOSIS — I5032 Chronic diastolic (congestive) heart failure: Secondary | ICD-10-CM | POA: Diagnosis not present

## 2017-07-03 DIAGNOSIS — Z880 Allergy status to penicillin: Secondary | ICD-10-CM | POA: Insufficient documentation

## 2017-07-03 DIAGNOSIS — D569 Thalassemia, unspecified: Secondary | ICD-10-CM | POA: Insufficient documentation

## 2017-07-03 DIAGNOSIS — Z794 Long term (current) use of insulin: Secondary | ICD-10-CM | POA: Insufficient documentation

## 2017-07-03 DIAGNOSIS — R569 Unspecified convulsions: Secondary | ICD-10-CM | POA: Diagnosis not present

## 2017-07-03 DIAGNOSIS — R350 Frequency of micturition: Secondary | ICD-10-CM | POA: Diagnosis not present

## 2017-07-03 DIAGNOSIS — Z9989 Dependence on other enabling machines and devices: Secondary | ICD-10-CM | POA: Insufficient documentation

## 2017-07-03 DIAGNOSIS — Z90710 Acquired absence of both cervix and uterus: Secondary | ICD-10-CM | POA: Insufficient documentation

## 2017-07-03 DIAGNOSIS — E1169 Type 2 diabetes mellitus with other specified complication: Secondary | ICD-10-CM | POA: Diagnosis present

## 2017-07-03 HISTORY — DX: Depression, unspecified: F32.A

## 2017-07-03 HISTORY — DX: Cardiac murmur, unspecified: R01.1

## 2017-07-03 HISTORY — DX: Major depressive disorder, single episode, unspecified: F32.9

## 2017-07-03 HISTORY — DX: Anxiety disorder, unspecified: F41.9

## 2017-07-03 HISTORY — DX: Personal history of other medical treatment: Z92.89

## 2017-07-03 HISTORY — DX: Dependence on other enabling machines and devices: Z99.89

## 2017-07-03 HISTORY — DX: Headache: R51

## 2017-07-03 HISTORY — DX: Personal history of peptic ulcer disease: Z87.11

## 2017-07-03 HISTORY — DX: Type 2 diabetes mellitus without complications: E11.9

## 2017-07-03 HISTORY — DX: Headache, unspecified: R51.9

## 2017-07-03 HISTORY — DX: Pneumonia, unspecified organism: J18.9

## 2017-07-03 HISTORY — DX: Personal history of other diseases of the digestive system: Z87.19

## 2017-07-03 HISTORY — DX: Obstructive sleep apnea (adult) (pediatric): G47.33

## 2017-07-03 LAB — RAPID URINE DRUG SCREEN, HOSP PERFORMED
Amphetamines: NOT DETECTED
Barbiturates: NOT DETECTED
Benzodiazepines: NOT DETECTED
Cocaine: NOT DETECTED
Opiates: NOT DETECTED
Tetrahydrocannabinol: NOT DETECTED

## 2017-07-03 LAB — COMPREHENSIVE METABOLIC PANEL
ALT: 33 U/L (ref 14–54)
AST: 24 U/L (ref 15–41)
Albumin: 3.8 g/dL (ref 3.5–5.0)
Alkaline Phosphatase: 97 U/L (ref 38–126)
Anion gap: 10 (ref 5–15)
BUN: 23 mg/dL — ABNORMAL HIGH (ref 6–20)
CO2: 25 mmol/L (ref 22–32)
Calcium: 9.2 mg/dL (ref 8.9–10.3)
Chloride: 97 mmol/L — ABNORMAL LOW (ref 101–111)
Creatinine, Ser: 1.22 mg/dL — ABNORMAL HIGH (ref 0.44–1.00)
GFR calc Af Amer: 53 mL/min — ABNORMAL LOW (ref >60)
GFR calc non Af Amer: 46 mL/min — ABNORMAL LOW (ref >60)
Glucose, Bld: 352 mg/dL — ABNORMAL HIGH (ref 65–99)
Potassium: 3.6 mmol/L (ref 3.5–5.1)
Sodium: 132 mmol/L — ABNORMAL LOW (ref 135–145)
Total Bilirubin: 0.3 mg/dL (ref 0.3–1.2)
Total Protein: 7.3 g/dL (ref 6.5–8.1)

## 2017-07-03 LAB — CBC
HCT: 30.6 % — ABNORMAL LOW (ref 36.0–46.0)
Hemoglobin: 9.5 g/dL — ABNORMAL LOW (ref 12.0–15.0)
MCH: 18.5 pg — ABNORMAL LOW (ref 26.0–34.0)
MCHC: 31 g/dL (ref 30.0–36.0)
MCV: 59.6 fL — ABNORMAL LOW (ref 78.0–100.0)
Platelets: 273 10*3/uL (ref 150–400)
RBC: 5.13 MIL/uL — ABNORMAL HIGH (ref 3.87–5.11)
RDW: 16.7 % — ABNORMAL HIGH (ref 11.5–15.5)
WBC: 10.5 10*3/uL (ref 4.0–10.5)

## 2017-07-03 LAB — I-STAT CHEM 8, ED
BUN: 26 mg/dL — ABNORMAL HIGH (ref 6–20)
Calcium, Ion: 1.12 mmol/L — ABNORMAL LOW (ref 1.15–1.40)
Chloride: 97 mmol/L — ABNORMAL LOW (ref 101–111)
Creatinine, Ser: 1 mg/dL (ref 0.44–1.00)
Glucose, Bld: 319 mg/dL — ABNORMAL HIGH (ref 65–99)
HCT: 36 % (ref 36.0–46.0)
Hemoglobin: 12.2 g/dL (ref 12.0–15.0)
Potassium: 4.1 mmol/L (ref 3.5–5.1)
Sodium: 134 mmol/L — ABNORMAL LOW (ref 135–145)
TCO2: 28 mmol/L (ref 22–32)

## 2017-07-03 LAB — URINALYSIS, ROUTINE W REFLEX MICROSCOPIC
Bilirubin Urine: NEGATIVE
Glucose, UA: 250 mg/dL — AB
Hgb urine dipstick: NEGATIVE
Ketones, ur: 15 mg/dL — AB
Leukocytes, UA: NEGATIVE
Nitrite: NEGATIVE
Protein, ur: NEGATIVE mg/dL
Specific Gravity, Urine: 1.015 (ref 1.005–1.030)
pH: 5.5 (ref 5.0–8.0)

## 2017-07-03 LAB — HEMOGLOBIN A1C
Hgb A1c MFr Bld: 10.8 % — ABNORMAL HIGH (ref 4.8–5.6)
Mean Plasma Glucose: 263.26 mg/dL

## 2017-07-03 LAB — GLUCOSE, CAPILLARY
Glucose-Capillary: 330 mg/dL — ABNORMAL HIGH (ref 65–99)
Glucose-Capillary: 322 mg/dL — ABNORMAL HIGH (ref 65–99)

## 2017-07-03 LAB — D-DIMER, QUANTITATIVE (NOT AT ARMC): D-Dimer, Quant: 0.48 ug/mL-FEU (ref 0.00–0.50)

## 2017-07-03 LAB — CBG MONITORING, ED
Glucose-Capillary: 320 mg/dL — ABNORMAL HIGH (ref 65–99)
Glucose-Capillary: 344 mg/dL — ABNORMAL HIGH (ref 65–99)

## 2017-07-03 LAB — I-STAT TROPONIN, ED: Troponin i, poc: 0.01 ng/mL (ref 0.00–0.08)

## 2017-07-03 LAB — CK: Total CK: 169 U/L (ref 38–234)

## 2017-07-03 LAB — TROPONIN I: Troponin I: <0.03 ng/mL (ref <0.03)

## 2017-07-03 LAB — I-STAT CG4 LACTIC ACID, ED: Lactic Acid, Venous: 2.03 mmol/L (ref 0.5–1.9)

## 2017-07-03 MED ORDER — HYDROCHLOROTHIAZIDE 12.5 MG PO CAPS
12.5000 mg | ORAL_CAPSULE | Freq: Every day | ORAL | Status: DC
  Administered 2017-07-03 – 2017-07-04 (×2): 12.5 mg via ORAL
  Filled 2017-07-03 (×2): qty 1

## 2017-07-03 MED ORDER — INFLUENZA VAC SPLIT QUAD 0.5 ML IM SUSY
0.5000 mL | PREFILLED_SYRINGE | INTRAMUSCULAR | Status: AC
  Administered 2017-07-04: 0.5 mL via INTRAMUSCULAR
  Filled 2017-07-03: qty 0.5

## 2017-07-03 MED ORDER — SODIUM CHLORIDE 0.9 % IV BOLUS (SEPSIS)
1000.0000 mL | Freq: Once | INTRAVENOUS | Status: AC
  Administered 2017-07-03: 1000 mL via INTRAVENOUS

## 2017-07-03 MED ORDER — ONDANSETRON HCL 4 MG PO TABS: 4.0000 mg | ORAL_TABLET | Freq: Four times a day (QID) | ORAL | Status: DC | PRN

## 2017-07-03 MED ORDER — LOSARTAN POTASSIUM-HCTZ 50-12.5 MG PO TABS: 1.0000 | ORAL_TABLET | Freq: Every day | ORAL | Status: DC

## 2017-07-03 MED ORDER — HYDRALAZINE HCL 20 MG/ML IJ SOLN: 5.0000 mg | INTRAMUSCULAR | Status: DC | PRN

## 2017-07-03 MED ORDER — ACETAMINOPHEN 650 MG RE SUPP: 650.0000 mg | Freq: Four times a day (QID) | RECTAL | Status: DC | PRN

## 2017-07-03 MED ORDER — ACETAMINOPHEN 325 MG PO TABS: 650.0000 mg | ORAL_TABLET | Freq: Four times a day (QID) | ORAL | Status: DC | PRN

## 2017-07-03 MED ORDER — INSULIN ASPART 100 UNIT/ML ~~LOC~~ SOLN
0.0000 [IU] | Freq: Three times a day (TID) | SUBCUTANEOUS | Status: DC
  Administered 2017-07-03 – 2017-07-04 (×3): 7 [IU] via SUBCUTANEOUS
  Administered 2017-07-04: 9 [IU] via SUBCUTANEOUS
  Administered 2017-07-04: 7 [IU] via SUBCUTANEOUS
  Administered 2017-07-05: 5 [IU] via SUBCUTANEOUS
  Administered 2017-07-05: 9 [IU] via SUBCUTANEOUS
  Filled 2017-07-03: qty 1

## 2017-07-03 MED ORDER — LOSARTAN POTASSIUM 50 MG PO TABS
50.0000 mg | ORAL_TABLET | Freq: Every day | ORAL | Status: DC
  Administered 2017-07-03 – 2017-07-04 (×2): 50 mg via ORAL
  Filled 2017-07-03 (×3): qty 1

## 2017-07-03 MED ORDER — INSULIN ASPART 100 UNIT/ML ~~LOC~~ SOLN
0.0000 [IU] | Freq: Every day | SUBCUTANEOUS | Status: DC
  Administered 2017-07-03: 4 [IU] via SUBCUTANEOUS
  Administered 2017-07-04: 3 [IU] via SUBCUTANEOUS

## 2017-07-03 MED ORDER — INSULIN GLARGINE 100 UNIT/ML ~~LOC~~ SOLN
20.0000 [IU] | Freq: Every day | SUBCUTANEOUS | Status: DC
  Administered 2017-07-03 – 2017-07-04 (×2): 20 [IU] via SUBCUTANEOUS
  Filled 2017-07-03 (×3): qty 0.2

## 2017-07-03 MED ORDER — ONDANSETRON HCL 4 MG/2ML IJ SOLN: 4.0000 mg | Freq: Four times a day (QID) | INTRAMUSCULAR | Status: DC | PRN

## 2017-07-03 MED ORDER — METOPROLOL TARTRATE 50 MG PO TABS
50.0000 mg | ORAL_TABLET | Freq: Two times a day (BID) | ORAL | Status: DC
  Administered 2017-07-03 – 2017-07-05 (×4): 50 mg via ORAL
  Filled 2017-07-03 (×4): qty 1

## 2017-07-03 MED ORDER — ATORVASTATIN CALCIUM 80 MG PO TABS
80.0000 mg | ORAL_TABLET | Freq: Every day | ORAL | Status: DC
  Administered 2017-07-03 – 2017-07-04 (×2): 80 mg via ORAL
  Filled 2017-07-03 (×2): qty 1

## 2017-07-03 MED ORDER — ASPIRIN 81 MG PO CHEW
81.0000 mg | CHEWABLE_TABLET | Freq: Every day | ORAL | Status: DC
  Administered 2017-07-03 – 2017-07-05 (×3): 81 mg via ORAL
  Filled 2017-07-03 (×3): qty 1

## 2017-07-03 MED ORDER — HYDROXYZINE HCL 25 MG PO TABS: 50.0000 mg | ORAL_TABLET | Freq: Three times a day (TID) | ORAL | Status: DC | PRN

## 2017-07-03 MED ORDER — LORAZEPAM 2 MG/ML IJ SOLN: 0.5000 mg | Freq: Four times a day (QID) | INTRAMUSCULAR | Status: DC | PRN

## 2017-07-03 MED ORDER — HEPARIN SODIUM (PORCINE) 5000 UNIT/ML IJ SOLN
5000.0000 [IU] | Freq: Three times a day (TID) | INTRAMUSCULAR | Status: DC
  Administered 2017-07-03 – 2017-07-05 (×6): 5000 [IU] via SUBCUTANEOUS
  Filled 2017-07-03 (×7): qty 1

## 2017-07-03 NOTE — Progress Notes (Signed)
Patient admitted to floor via stretcher. Patient oriented to room. Telemetry applied. VSS. Orders reviewed. Will monitor.

## 2017-07-03 NOTE — ED Notes (Signed)
C/o feeling like her heart is racing , heart rate is 63 NS , patient is futtering her eyes and starting to hyperventilate, instructed patient to slow her breathing and assured her her heart isn't racing. Patient calming down.

## 2017-07-03 NOTE — ED Notes (Signed)
Patient states she is feeling much better.

## 2017-07-03 NOTE — H&P (Signed)
History and Physical    Lynn [name] OQH:[id number] DOB: December 21, 1955 DOA: 07/03/2017  PCP: [name], MD Patient coming from: home  Chief Complaint: SOB, weakness, rapid heart rate.   HPI: Lynn [name] is a 61 y.o. female with medical history significant of nonobstructive coronary artery disease with recent NSTEMI and cardiac catheterization, diabetes, chronic diastolic congestive heart failure, hyperlipidemia, hypertension, microcytic anemia, OSA on CPAP, thalassemia. Patient reports considerable anxiety and stress over her recent elevation in blood glucose levels. For the past month or so patient reports having issues with both her Victoza and then her insulin ending in a properly manage her glucose. Sugar levels have been typically in the 200-300 range in sometime in the 400 range.  On day of admission patient reports waking up at 05:00 with the urge to go to bathroom. Upon standing and may be related to the bathroom patient became very lightheaded and felt like she was going to pass out. Patient sat down on the toilet and woke up approximately 1 hour later at 06:00. She said that her head was resting on the sink to the side of the toilet but she was still sitting on the toilet. Patient states that she was sweaty at the time, was unable to get off the toilet due to weakness and then had a bowel movement. At approximately 06:30 patient was finally able to get off the toilet. Patient endorses a sensation of having her heart race and having a difficult time catching her breath at that time. Patient checked her sugar level which was 260. Patient laid down on the bed and took a nitroglycerin with no change in her sensation of heart racing or shortness of breath. Patient did not check her pulse. Episode lasted about 5 minutes. Patient did not lose consciousness at the time. Family was concerned about patient and so they brought her to the emergency room. While waiting in the triage area  patient's husband reports that patient became unresponsive, eyes rolled in the back of her head, and legs and arms began to shake. This lasted approximately 60-90 seconds. After the episode patient was reportedly more awake and alert than prior to episode and that she felt hot and sweaty. Patient had a second episode shortly later. Denies any confusion during and after or before episode. Patient also reports a second episode of racing heartbeat and shortness of breath that occurred at approximately 09:15 and was witnessed by the nursing staff.  Of note patient lately has been dealing with urinary frequency but no dysuria and has had normal bowel habits.   Patient denies any chest pain, headache, neck stiffness, focal neurological deficit, fever, rash, lower extremity swelling, orthopnea, flank pain, abdominal pain.  All the above symptoms are very different than when patient was admitted early in the year with a NSTEMI.   2 years ago developed a clot in her R eye. L eye retinal detachment 1 year ago. Sees ophthalmologist regularly.   ED Course: Vaughan Basta bolus given. Placed on telemetry. Objective findings outlined below.  Review of Systems: As per HPI otherwise all other systems reviewed and are negative  Ambulatory Status: typically no restrictions  Past Medical History:  Diagnosis Date  . Arthritis   . Clotting disorder ([place])    blood clot in eye 2016  . Coronary artery disease, non-occlusive    a. 11/2016 NSTEMI/Cath: LM nl, LAD 40p, 79m, D1/2 small, LCX 40ost, OM2/3 nl/small, RCA 35 ost/mid, RPDA/RPL small/nl.  . Diabetes mellitus without complication ([facility]   .  Diastolic dysfunction    a. 11/2016 Echo: EF 55-60%, gr1 DD, sev calcified MV annulus, mildly dil LA.  Marland Kitchen Eye hemorrhage 4/16   resolved rt  . Hyperlipidemia   . Hypertension   . Microcytic anemia   . Sleep apnea    use CPAP  . Thalassemia   . Ulcer     Past Surgical History:  Procedure Laterality Date  . 48-Hour Monitor   03/18/2017   Sinus rhythm with sinus tachycardia (rate 58-134 BPM)multiple PVCs noted with couplets and bigeminy. One triplet. 7 runs of PAT ranging from 100-130 bpm. Longest was 33 beats.  . ABDOMINAL HYSTERECTOMY    . arthroscopic knee Left 2-16  . CARDIAC CATHETERIZATION N/A 11/19/2016   Procedure: Left Heart Cath and Coronary Angiography;  Surgeon: Belva Crome, MD;  Location: [facility];  Service: Cardiovascular.    LM nl, LAD 40p, 49m, D1/2 small, LCX 40ost, OM2/3 nl/small, RCA 35 ost/mid, RPDA/RPL small/nl.  . CARPAL TUNNEL RELEASE Right 11/01/2014  . COLONOSCOPY    . JOINT REPLACEMENT    . TONSILLECTOMY    . TOTAL KNEE ARTHROPLASTY Right 02/18/2015   Procedure: RIGHT TOTAL KNEE ARTHROPLASTY;  Surgeon: SNetta Cedars MD;  Location: [facility]  Service: Orthopedics;  Laterality: Right;  . TOTAL KNEE ARTHROPLASTY Left 08/19/2015   Procedure: LEFT TOTAL KNEE ARTHROPLASTY;  Surgeon: SNetta Cedars MD;  [facility]  Service: Orthopedics;  Laterality: Left;  . TRANSTHORACIC ECHOCARDIOGRAM  11/2016   :normal LV size, thickness and function. EF 55-60%. GR 1 DD.No RWMA severe mitral annular calcification, but no mitral stenosis. Mild LA dilation    Social History   Social History  . Marital status: Married    Spouse name: N/A  . Number of children: N/A  . Years of education: N/A   Occupational History  . Not on file.   Social History Main Topics  . Smoking status: Former Smoker    Packs/day: 0.25    Years: 44.00    Types: Cigarettes    Quit date: 02/17/2015  . Smokeless tobacco: Never Used  . Alcohol use 0.0 oz/week     Comment: socially  . Drug use: No  . Sexual activity: Not on file   Other Topics Concern  . Not on file   Social History Narrative  . No narrative on file    Allergies  Allergen Reactions  . Penicillins Hives    Has patient had a PCN reaction causing immediate rash, facial/tongue/throat swelling, SOB or lightheadedness with hypotension: Yes Has  patient had a PCN reaction causing severe rash involving mucus membranes or skin necrosis: No Has patient had a PCN reaction that required hospitalization No Has patient had a PCN reaction occurring within the last 10 years: No If all of the above answers are "NO", then may proceed with Cephalosporin use.  . Metformin And Related     Diarrhea, bleeding    Family History  Problem Relation Age of Onset  . Cancer Mother   . Diabetes Mother   . Hypertension Mother   . Hyperlipidemia Mother   . Cancer Father   . Hyperlipidemia Father   . Mental illness Sister   . Diabetes Sister   . Diabetes Maternal Grandmother   . Diabetes Maternal Grandfather   . Colon cancer Neg Hx   . Esophageal cancer Neg Hx   . Stomach cancer Neg Hx   . Rectal cancer Neg Hx       Prior to Admission medications  Medication Sig Start Date End Date Taking? Authorizing Provider  aspirin 81 MG chewable tablet Chew 1 tablet (81 mg total) by mouth daily. 11/21/16  Yes Cheryln Manly, NP  atorvastatin (LIPITOR) 80 MG tablet Take 1 tablet (80 mg total) by mouth daily at 6 PM. 06/12/17  Yes Copland, Gay Filler, MD  blood glucose meter kit and supplies KIT Use up to four times daily as directed.Dx: E11.9 06/20/16  Yes Copland, Gay Filler, MD  Blood Glucose Monitoring Suppl (ACCU-CHEK AVIVA PLUS) W/DEVICE KIT  01/19/15  Yes [provider]  cetirizine (ZYRTEC) 10 MG tablet Take 10 mg by mouth daily as needed for allergies.   Yes [provider]  folic acid (FOLVITE) 1 MG tablet Take 2 tablets (2 mg total) by mouth daily. Patient taking differently: Take 1 mg by mouth 2 (two) times daily.  02/26/17  Yes Ennever, Rudell Cobb, MD  glipiZIDE (GLUCOTROL) 5 MG tablet Take 1 tablet (5 mg total) by mouth 2 (two) times daily before a meal. 04/18/17  Yes Copland, Jessica C, MD  glucose blood test strip Use up to four times daily as directly. Dx E11.9 09/13/16  Yes Copland, Gay Filler, MD  Insulin Glargine (LANTUS) 100 UNIT/ML  Solostar Pen Start at 5 units daily and increase as directed by MD Patient taking differently: Inject 13 Units into the skin every morning. Start at 5 units daily and increase as directed by MD 06/22/17  Yes Copland, Gay Filler, MD  Insulin Pen Needle (PEN NEEDLES) 31G X 5 MM MISC 1 Units by Does not apply route daily. For use with victoza pen 05/28/16  Yes Copland, Gay Filler, MD  losartan-hydrochlorothiazide (HYZAAR) 50-12.5 MG tablet Take 1 tablet by mouth daily. 06/07/17  Yes Leonie Man, MD  metoprolol tartrate (LOPRESSOR) 25 MG tablet Take 1 tablet (25 mg total) by mouth daily as needed. If break thorough fast heart beat occur 06/07/17  Yes Leonie Man, MD  metoprolol tartrate (LOPRESSOR) 50 MG tablet Take 1 tablet (50 mg total) by mouth 2 (two) times daily. 06/07/17 09/05/17 Yes Leonie Man, MD  Multiple Vitamin (MULTIVITAMIN WITH MINERALS) TABS tablet Take 1 tablet by mouth daily. Reported on 01/03/2016   Yes [provider]  nitroGLYCERIN (NITROSTAT) 0.4 MG SL tablet PLACE 1 TABLET UNDER THE TONGUE EVERY 5MIN AS NEEDED FOR 3 DOSES FOR CHEST PAIN 04/12/17  Yes Leonie Man, MD  PRESCRIPTION MEDICATION Inhale into the lungs at bedtime. CPAP   Yes [provider]  liraglutide (VICTOZA) 18 MG/3ML SOPN START WITH 0.6 MG INJECTED [name] DAILY FOR ONE WEEK, THEN INCREASE TO 1.2 MG DAILY 04/18/17   Copland, Gay Filler, MD    Physical Exam: Vitals:   07/03/17 0728 07/03/17 0730 07/03/17 0812 07/03/17 0846  BP: 105/69  (!) 107/59 (!) 126/54  Pulse: 65  (!) 59 63  Resp: _0 (!) 22  Temp:      TempSrc:      SpO2: 98%  96% 99%     General:  Appears calm and comfortable Eyes:  PERRL, EOMI, normal lids, iris ENT:  grossly normal hearing, lips & tongue, mmm Neck:  no LAD, masses or thyromegaly Cardiovascular:  RRR, no m/r/g. Trace LE edema.  Respiratory:  CTA bilaterally, no w/r/r. Normal respiratory effort. Abdomen:  soft, ntnd, NABS Skin:  no rash or induration seen  on limited exam Musculoskeletal:  grossly normal tone BUE/BLE, good ROM, no bony abnormality Psychiatric:  grossly normal mood and  affect, speech fluent and appropriate, AOx3 Neurologic:  CN 2-12 grossly intact, moves all extremities in coordinated fashion, sensation intact  Labs on Admission: I have personally reviewed following labs and imaging studies  CBC:  Recent Labs Lab 07/03/17 0725 07/03/17 0814  WBC 10.5  --   HGB 9.5* 12.2  HCT 30.6* 36.0  MCV 59.6*  --   PLT 273  --    Basic Metabolic Panel:  Recent Labs Lab 07/03/17 0725 07/03/17 0814  NA 132* 134*  K 3.6 4.1  CL 97* 97*  CO2 25  --   GLUCOSE 352* 319*  BUN 23* 26*  CREATININE 1.22* 1.00  CALCIUM 9.2  --    GFR: CrCl cannot be calculated (Unknown ideal weight.). Liver Function Tests:  Recent Labs Lab 07/03/17 0725  AST 24  ALT 33  ALKPHOS 97  BILITOT 0.3  PROT 7.3  ALBUMIN 3.8   No results for input(s): LIPASE, AMYLASE in the last 168 hours. No results for input(s): AMMONIA in the last 168 hours. Coagulation Profile: No results for input(s): INR, PROTIME in the last 168 hours. Cardiac Enzymes: No results for input(s): CKTOTAL, CKMB, CKMBINDEX, TROPONINI in the last 168 hours. BNP (last 3 results) No results for input(s): PROBNP in the last 8760 hours. HbA1C: No results for input(s): HGBA1C in the last 72 hours. CBG:  Recent Labs Lab 07/03/17 0711  GLUCAP 320*   Lipid Profile: No results for input(s): CHOL, HDL, LDLCALC, TRIG, CHOLHDL, LDLDIRECT in the last 72 hours. Thyroid Function Tests: No results for input(s): TSH, T4TOTAL, FREET4, T3FREE, THYROIDAB in the last 72 hours. Anemia Panel: No results for input(s): VITAMINB12, FOLATE, FERRITIN, TIBC, IRON, RETICCTPCT in the last 72 hours. Urine analysis:    Component Value Date/Time   COLORURINE YELLOW 08/18/2008 1535   APPEARANCEUR CLEAR 08/18/2008 1535   LABSPEC 1.021 08/18/2008 1535   PHURINE 5.5 08/18/2008 1535   GLUCOSEU  NEGATIVE 08/18/2008 1535   HGBUR NEGATIVE 08/18/2008 1535   BILIRUBINUR NEGATIVE 08/18/2008 1535   KETONESUR NEGATIVE 08/18/2008 1535   PROTEINUR NEGATIVE 08/18/2008 1535   UROBILINOGEN 0.2 08/18/2008 1535   NITRITE NEGATIVE 08/18/2008 1535   LEUKOCYTESUR  08/18/2008 1535    NEGATIVE MICROSCOPIC NOT DONE ON URINES WITH NEGATIVE PROTEIN, BLOOD, LEUKOCYTES, NITRITE, OR GLUCOSE <1000 mg/dL.    Creatinine Clearance: CrCl cannot be calculated (Unknown ideal weight.).  Sepsis Labs: _0 (procalcitonin:4,lacticidven:4) )No results found for this or any previous visit (from the past 240 hour(s)).   Radiological Exams on Admission: Ct Head Wo Contrast  Result Date: 07/03/2017 CLINICAL DATA:  Altered mental status EXAM: CT HEAD WITHOUT CONTRAST TECHNIQUE: Contiguous axial images were obtained from the base of the skull through the vertex without intravenous contrast. COMPARISON:  None. FINDINGS: Brain: Calcifications are noted in the cortex of the left posterior parietal lobe with associated atrophy. This is likely related to remote infection or infarct. No acute intracranial abnormality. Specifically, no hemorrhage, hydrocephalus, mass lesion, acute infarction, or significant intracranial injury. Vascular: No hyperdense vessel or unexpected calcification. Skull: No acute calvarial abnormality. Sinuses/Orbits: Visualized paranasal sinuses and mastoids clear. Orbital soft tissues unremarkable. Other: None IMPRESSION: Cortical calcifications in the posterior left parietal region, likely related to remote infection or infarct. No acute intracranial abnormality. Electronically Signed   By: Rolm Baptise M.D.   On: 07/03/2017 08:41   Dg Chest Port 1 View  Result Date: 07/03/2017 CLINICAL DATA:  Possible syncope. Lethargy. Acute mental status changes. Single episode of diaphoresis and vomiting. EXAM: PORTABLE  CHEST 1 VIEW COMPARISON:  02/21/2017, 11/16/2016 and earlier. FINDINGS: Suboptimal inspiration  accounts for crowded bronchovascular markings, especially in the bases, and accentuates the cardiac silhouette. Taking this into account, cardiac silhouette upper normal in size. Lungs clear. Pulmonary vascularity normal. No visible pleural effusions. IMPRESSION: Suboptimal inspiration.  No acute cardiopulmonary disease. Electronically Signed   By: Evangeline Dakin M.D.   On: 07/03/2017 08:07    EKG: Independently reviewed. Sinus. No ACS.   Assessment/Plan Active Problems:   * No active hospital problems. *   Near syncope, palpitations, diaphoresis, SOB: timing and description of sx as outlined in HPI concerning primarily for panic attack vs arrhythmia vs infectious process. CXR w/o acute process, chemistries essentialy normal w/ slight elevation in lactic acid. UA pending but complaining of urinary frequency w/o dysuria. Possible viral process such as flu though sx are short lived and no fevers or myalgias.  H/o NSTEMI w/ cath earlier in the year w/ non-obstructive CAD. EKG and trop unremarkable. Even when pt endorsed palpitations and a fast heart beat the nursing staff noted HR was stable in sinus at 63bpm. Pt endorsing significant stress over recent elevation in glucose and medications being unable to control glucose.  - Tele, cycle trop - EKG in am - f/u UA (ABX and UCX if needed) - Ativan and vistaril PRN - orthostatics (already has received 1L NS bolus)  Seizure-like activity: questionable pt reportedly had intermittent episodes of hyperventilation in triage and ED which can most certainly lead to syncope and even seizure thought not worrisome for significant underlying pathology. Pt described as being more alert immediately after reported seizure activity which calls into question true seizure. CT head w/o acute process.  - Neuro checks, CK - No further workup or consultation to specialists at this time. - Management of anxiety as above.   DM: pt very anxious over elevated glucose despite  changing from victoza to SS lantus. Currently on lantus 13 Qday - SSI - increase to Lantus 20  Non-obstructive CAD: nml cath earlier in 2018 - continue ASA, lipitor  Diastolic congestive heart failure: Last echo from 12/04/2016 showing an EF 55% and grade 1 distal dysfunction. No evidence of decompensation.  HTN: normotensive at time of admission - hold metop and Hyzaar   DVT prophylaxis: hep  Code Status: full  Family Communication: husband  Disposition Plan: pending   Consults called: none  Admission status: observation    Justus Duerr J MD [facility]  If 7PM-7AM, please contact night-coverage [email or web address] Password [facility]  07/03/2017, 9:19 AM

## 2017-07-03 NOTE — ED Triage Notes (Signed)
Pt is lethargic and altered at triage, tremors noted. Possible syncopal epiosode.  Family states pt woke up altered this am. Had episode of diaphoresis and n/v at triage and then states she feels better. Still remains altered and fatigued. Denies hx of seizures.

## 2017-07-03 NOTE — ED Notes (Signed)
Patient had a large BM in bedpain states she feels better.

## 2017-07-03 NOTE — ED Provider Notes (Signed)
[facility]-EMERGENCY DEPT Provider Note   CSN: [id number] Arrival date & time: 07/03/17  0704     History   Chief Complaint Chief Complaint  Patient presents with  . Altered Mental Status  . Emesis    HPI Lynn [name] is a 61 y.o. female.  The history is provided by the patient and the spouse. No language interpreter was used.  Altered Mental Status    Emesis      Lynn [name] is a 61 y.o. female who presents to the Emergency Department complaining of nausea, syncope. Level V caveat due to altered mental status.  Hx is provided by patient and husband.  Her husband reports that she was last seen normal last night. When she got up around 5:00 she had a syncopal episode and he was concerned she had a heart attack. She reports feeling nausea. No chest pain, dull pain. She did have a syncopal episode when she felt like her heart was beating fast and she was short of breath. It is unclear how long she was unconscious. No recent illnesses. She did have her blood pressure medications recently adjusted. Question of seizure-like event in triage.  Past Medical History:  Diagnosis Date  . Arthritis   . Clotting disorder (HCC)    blood clot in eye 2016  . Coronary artery disease, non-occlusive    a. 11/2016 NSTEMI/Cath: LM nl, LAD 40p, 58md, D1/2 small, LCX 40ost, OM2/3 nl/small, RCA 35 ost/mid, RPDA/RPL small/nl.  . Diabetes mellitus without complication (HCC)   . Diastolic dysfunction    a. 11/2016 Echo: EF 55-60%, gr1 DD, sev calcified MV annulus, mildly dil LA.  Marland Kitchen Eye hemorrhage 4/16   resolved rt  . Hyperlipidemia   . Hypertension   . Microcytic anemia   . Sleep apnea    use CPAP  . Thalassemia   . Ulcer     Patient Active Problem List   Diagnosis Date Noted  . Syncope 07/03/2017  . Near syncope 07/03/2017  . Anxiety 07/03/2017  . Seizure-like activity (HCC) 07/03/2017  . Palpitations 07/03/2017  . Uncontrolled diabetes mellitus (HCC) 07/03/2017  . Coronary artery  disease, non-occlusive 12/08/2016  . PAT (paroxysmal atrial tachycardia) (HCC)   . Non-STEMI (non-ST elevated myocardial infarction) (HCC) 11/16/2016  . H/O total knee replacement 08/19/2015  . Vision, loss, sudden 03/07/2015  . S/P TKR (total knee replacement) using cement 02/18/2015  . Thalassemia 09/02/2013  . Hyperlipidemia with target low density lipoprotein (LDL) cholesterol less than 100 mg/dL 38/18/2993  . Diabetes mellitus (HCC) 07/22/2012  . Essential hypertension 07/22/2012    Past Surgical History:  Procedure Laterality Date  . 48-Hour Monitor  03/18/2017   Sinus rhythm with sinus tachycardia (rate 58-134 BPM)multiple PVCs noted with couplets and bigeminy. One triplet. 7 runs of PAT ranging from 100-130 bpm. Longest was 33 beats.  . ABDOMINAL HYSTERECTOMY    . arthroscopic knee Left 2-16  . CARDIAC CATHETERIZATION N/A 11/19/2016   Procedure: Left Heart Cath and Coronary Angiography;  Surgeon: Lyn Records, MD;  Location: [facility] INVASIVE CV LAB;  Service: Cardiovascular.    LM nl, LAD 40p, 57md, D1/2 small, LCX 40ost, OM2/3 nl/small, RCA 35 ost/mid, RPDA/RPL small/nl.  . CARPAL TUNNEL RELEASE Right 11/01/2014  . COLONOSCOPY    . JOINT REPLACEMENT    . TONSILLECTOMY    . TOTAL KNEE ARTHROPLASTY Right 02/18/2015   Procedure: RIGHT TOTAL KNEE ARTHROPLASTY;  Surgeon: Beverely Low, MD;  Location: [facility] OR;  Service: Orthopedics;  Laterality: Right;  .  TOTAL KNEE ARTHROPLASTY Left 08/19/2015   Procedure: LEFT TOTAL KNEE ARTHROPLASTY;  Surgeon: Netta Cedars, MD;  Location: [place];  Service: Orthopedics;  Laterality: Left;  . TRANSTHORACIC ECHOCARDIOGRAM  11/2016   :normal LV size, thickness and function. EF 55-60%. GR 1 DD.No RWMA severe mitral annular calcification, but no mitral stenosis. Mild LA dilation    OB History    No data available       Home Medications    Prior to Admission medications   Medication Sig Start Date End Date Taking? Authorizing Provider  aspirin 81 MG  chewable tablet Chew 1 tablet (81 mg total) by mouth daily. 11/21/16  Yes Cheryln Manly, NP  atorvastatin (LIPITOR) 80 MG tablet Take 1 tablet (80 mg total) by mouth daily at 6 PM. 06/12/17  Yes Copland, Gay Filler, MD  blood glucose meter kit and supplies KIT Use up to four times daily as directed.Dx: E11.9 06/20/16  Yes Copland, Gay Filler, MD  Blood Glucose Monitoring Suppl (ACCU-CHEK AVIVA PLUS) W/DEVICE KIT  01/19/15  Yes [provider]  cetirizine (ZYRTEC) 10 MG tablet Take 10 mg by mouth daily as needed for allergies.   Yes [provider]  folic acid (FOLVITE) 1 MG tablet Take 2 tablets (2 mg total) by mouth daily. Patient taking differently: Take 1 mg by mouth 2 (two) times daily.  02/26/17  Yes Ennever, Rudell Cobb, MD  glipiZIDE (GLUCOTROL) 5 MG tablet Take 1 tablet (5 mg total) by mouth 2 (two) times daily before a meal. 04/18/17  Yes Copland, Jessica C, MD  glucose blood test strip Use up to four times daily as directly. Dx E11.9 09/13/16  Yes Copland, Gay Filler, MD  Insulin Glargine (LANTUS) 100 UNIT/ML Solostar Pen Start at 5 units daily and increase as directed by MD Patient taking differently: Inject 13 Units into the skin every morning. Start at 5 units daily and increase as directed by MD 06/22/17  Yes Copland, Gay Filler, MD  Insulin Pen Needle (PEN NEEDLES) 31G X 5 MM MISC 1 Units by Does not apply route daily. For use with victoza pen 05/28/16  Yes Copland, Gay Filler, MD  losartan-hydrochlorothiazide (HYZAAR) 50-12.5 MG tablet Take 1 tablet by mouth daily. 06/07/17  Yes Leonie Man, MD  metoprolol tartrate (LOPRESSOR) 25 MG tablet Take 1 tablet (25 mg total) by mouth daily as needed. If break thorough fast heart beat occur 06/07/17  Yes Leonie Man, MD  metoprolol tartrate (LOPRESSOR) 50 MG tablet Take 1 tablet (50 mg total) by mouth 2 (two) times daily. 06/07/17 09/05/17 Yes Leonie Man, MD  Multiple Vitamin (MULTIVITAMIN WITH MINERALS) TABS tablet Take 1 tablet  by mouth daily. Reported on 01/03/2016   Yes [provider]  nitroGLYCERIN (NITROSTAT) 0.4 MG SL tablet PLACE 1 TABLET UNDER THE TONGUE EVERY 5MIN AS NEEDED FOR 3 DOSES FOR CHEST PAIN 04/12/17  Yes Leonie Man, MD  PRESCRIPTION MEDICATION Inhale into the lungs at bedtime. CPAP   Yes [provider]  liraglutide (VICTOZA) 18 MG/3ML SOPN START WITH 0.6 MG INJECTED [place] DAILY FOR ONE WEEK, THEN INCREASE TO 1.2 MG DAILY 04/18/17   Copland, Gay Filler, MD    Family History Family History  Problem Relation Age of Onset  . Cancer Mother   . Diabetes Mother   . Hypertension Mother   . Hyperlipidemia Mother   . Cancer Father   . Hyperlipidemia Father   . Mental illness Sister   . Diabetes Sister   .  Diabetes Maternal Grandmother   . Diabetes Maternal Grandfather   . Colon cancer Neg Hx   . Esophageal cancer Neg Hx   . Stomach cancer Neg Hx   . Rectal cancer Neg Hx     Social History Social History  Substance Use Topics  . Smoking status: Former Smoker    Packs/day: 0.25    Years: 44.00    Types: Cigarettes    Quit date: 02/17/2015  . Smokeless tobacco: Never Used  . Alcohol use 0.0 oz/week     Comment: socially     Allergies   Penicillins and Metformin and related   Review of Systems Review of Systems  Gastrointestinal: Positive for vomiting.  All other systems reviewed and are negative.    Physical Exam Updated Vital Signs BP 96/65   Pulse 70   Temp 98.2 F (36.8 C) (Oral)   Resp 20   SpO2 97%   Physical Exam  Constitutional: She is oriented to person, place, and time. She appears well-developed and well-nourished. She appears distressed.  HENT:  Head: Normocephalic and atraumatic.  Cardiovascular: Normal rate and regular rhythm.   No murmur heard. Pulmonary/Chest: Effort normal and breath sounds normal. No respiratory distress.  Abdominal: Soft. There is no tenderness. There is no rebound and no guarding.  Musculoskeletal: She exhibits no  edema or tenderness.  Neurological: She is oriented to person, place, and time.  Drowsy but arousable to verbal stimuli. Profound generalized weakness. EOMI. Pupils equal round and reactive.  Skin: Skin is warm. She is diaphoretic. There is pallor.  Psychiatric: She has a normal mood and affect. Her behavior is normal.  Nursing note and vitals reviewed.    ED Treatments / Results  Labs (all labs ordered are listed, but only abnormal results are displayed) Labs Reviewed  COMPREHENSIVE METABOLIC PANEL - Abnormal; Notable for the following:       Result Value   Sodium 132 (*)    Chloride 97 (*)    Glucose, Bld 352 (*)    BUN 23 (*)    Creatinine, Ser 1.22 (*)    GFR calc non Af Amer 46 (*)    GFR calc Af Amer 53 (*)    All other components within normal limits  CBC - Abnormal; Notable for the following:    RBC 5.13 (*)    Hemoglobin 9.5 (*)    HCT 30.6 (*)    MCV 59.6 (*)    MCH 18.5 (*)    RDW 16.7 (*)    All other components within normal limits  URINALYSIS, ROUTINE W REFLEX MICROSCOPIC - Abnormal; Notable for the following:    APPearance HAZY (*)    Glucose, UA 250 (*)    Ketones, ur 15 (*)    All other components within normal limits  CBG MONITORING, ED - Abnormal; Notable for the following:    Glucose-Capillary 320 (*)    All other components within normal limits  CBG MONITORING, ED - Abnormal; Notable for the following:    Glucose-Capillary 344 (*)    All other components within normal limits  I-STAT CHEM 8, ED - Abnormal; Notable for the following:    Sodium 134 (*)    Chloride 97 (*)    BUN 26 (*)    Glucose, Bld 319 (*)    Calcium, Ion 1.12 (*)    All other components within normal limits  I-STAT CG4 LACTIC ACID, ED - Abnormal; Notable for the following:    Lactic Acid, Venous  2.03 (*)    All other components within normal limits  D-DIMER, QUANTITATIVE (NOT AT [facility])  RAPID URINE DRUG SCREEN, HOSP PERFORMED  HIV ANTIBODY (ROUTINE TESTING)  CK  TROPONIN I    TROPONIN I  TROPONIN I  HEMOGLOBIN A1C  I-STAT TROPONIN, ED    EKG  EKG Interpretation  Date/Time:  Wednesday July 03 2017 07:06:13 EDT Ventricular Rate:  66 PR Interval:  158 QRS Duration: 86 QT Interval:  438 QTC Calculation: 459 R Axis:   -3 Text Interpretation:  Normal sinus rhythm Low voltage QRS Cannot rule out Anterior infarct , age undetermined Abnormal ECG Confirmed by Quintella Reichert [phone]) on 07/03/2017 7:30:37 AM       Radiology Ct Head Wo Contrast  Result Date: 07/03/2017 CLINICAL DATA:  Altered mental status EXAM: CT HEAD WITHOUT CONTRAST TECHNIQUE: Contiguous axial images were obtained from the base of the skull through the vertex without intravenous contrast. COMPARISON:  None. FINDINGS: Brain: Calcifications are noted in the cortex of the left posterior parietal lobe with associated atrophy. This is likely related to remote infection or infarct. No acute intracranial abnormality. Specifically, no hemorrhage, hydrocephalus, mass lesion, acute infarction, or significant intracranial injury. Vascular: No hyperdense vessel or unexpected calcification. Skull: No acute calvarial abnormality. Sinuses/Orbits: Visualized paranasal sinuses and mastoids clear. Orbital soft tissues unremarkable. Other: None IMPRESSION: Cortical calcifications in the posterior left parietal region, likely related to remote infection or infarct. No acute intracranial abnormality. Electronically Signed   By: Rolm Baptise M.D.   On: 07/03/2017 08:41   Dg Chest Port 1 View  Result Date: 07/03/2017 CLINICAL DATA:  Possible syncope. Lethargy. Acute mental status changes. Single episode of diaphoresis and vomiting. EXAM: PORTABLE CHEST 1 VIEW COMPARISON:  02/21/2017, 11/16/2016 and earlier. FINDINGS: Suboptimal inspiration accounts for crowded bronchovascular markings, especially in the bases, and accentuates the cardiac silhouette. Taking this into account, cardiac silhouette upper normal in size.  Lungs clear. Pulmonary vascularity normal. No visible pleural effusions. IMPRESSION: Suboptimal inspiration.  No acute cardiopulmonary disease. Electronically Signed   By: Evangeline Dakin M.D.   On: 07/03/2017 08:07    Procedures Procedures (including critical care time)  Medications Ordered in ED Medications  aspirin chewable tablet 81 mg (81 mg Oral Given 07/03/17 1234)  insulin glargine (LANTUS) injection 20 Units (20 Units Subcutaneous Given 07/03/17 1341)  atorvastatin (LIPITOR) tablet 80 mg (not administered)  metoprolol tartrate (LOPRESSOR) tablet 50 mg (not administered)  heparin injection 5,000 Units (5,000 Units Subcutaneous Given 07/03/17 1415)  ondansetron (ZOFRAN) tablet 4 mg (not administered)    Or  ondansetron (ZOFRAN) injection 4 mg (not administered)  acetaminophen (TYLENOL) tablet 650 mg (not administered)    Or  acetaminophen (TYLENOL) suppository 650 mg (not administered)  insulin aspart (novoLOG) injection 0-9 Units (7 Units Subcutaneous Given 07/03/17 1233)  insulin aspart (novoLOG) injection 0-5 Units (not administered)  LORazepam (ATIVAN) injection 0.5-1 mg (not administered)  hydrOXYzine (ATARAX/VISTARIL) tablet 50 mg (not administered)  hydrALAZINE (APRESOLINE) injection 5-10 mg (not administered)  losartan (COZAAR) tablet 50 mg (50 mg Oral Given 07/03/17 1234)    And  hydrochlorothiazide (MICROZIDE) capsule 12.5 mg (12.5 mg Oral Given 07/03/17 1235)  sodium chloride 0.9 % bolus 1,000 mL (0 mLs Intravenous Stopped 07/03/17 1028)     Initial Impression / Assessment and Plan / ED Course  I have reviewed the triage vital signs and the nursing notes.  Pertinent labs & imaging results that were available during my care of the patient were reviewed by  me and considered in my medical decision making (see chart for details).     Patient here for evaluation of altered mental status, syncopal event. Patient lethargic and ill appearing on initial evaluation. On repeat  assessment she is improved appearing. Her mental status is improved and her diaphoresis has resolved. She is low risk for PE and d-dimer is negative. Presentation is not consistent with subarachnoid hemorrhage, dissection.  Hospitalist consulted for observation admission.    Final Clinical Impressions(s) / ED Diagnoses   Final diagnoses:  None    New Prescriptions New Prescriptions   No medications on file     Quintella Reichert, MD 07/03/17 1556

## 2017-07-04 ENCOUNTER — Observation Stay (HOSPITAL_COMMUNITY): Payer: Medicare Other

## 2017-07-04 DIAGNOSIS — E0800 Diabetes mellitus due to underlying condition with hyperosmolarity without nonketotic hyperglycemic-hyperosmolar coma (NKHHC): Secondary | ICD-10-CM | POA: Diagnosis not present

## 2017-07-04 DIAGNOSIS — R569 Unspecified convulsions: Secondary | ICD-10-CM | POA: Diagnosis not present

## 2017-07-04 DIAGNOSIS — R002 Palpitations: Secondary | ICD-10-CM | POA: Diagnosis not present

## 2017-07-04 DIAGNOSIS — Z794 Long term (current) use of insulin: Secondary | ICD-10-CM

## 2017-07-04 DIAGNOSIS — R55 Syncope and collapse: Secondary | ICD-10-CM | POA: Diagnosis not present

## 2017-07-04 LAB — CBC
HCT: 29.4 % — ABNORMAL LOW (ref 36.0–46.0)
Hemoglobin: 9.4 g/dL — ABNORMAL LOW (ref 12.0–15.0)
MCH: 19.2 pg — ABNORMAL LOW (ref 26.0–34.0)
MCHC: 32 g/dL (ref 30.0–36.0)
MCV: 60 fL — ABNORMAL LOW (ref 78.0–100.0)
Platelets: 241 10*3/uL (ref 150–400)
RBC: 4.9 MIL/uL (ref 3.87–5.11)
RDW: 17.1 % — ABNORMAL HIGH (ref 11.5–15.5)
WBC: 10.9 10*3/uL — ABNORMAL HIGH (ref 4.0–10.5)

## 2017-07-04 LAB — BASIC METABOLIC PANEL
Anion gap: 11 (ref 5–15)
BUN: 16 mg/dL (ref 6–20)
CO2: 21 mmol/L — ABNORMAL LOW (ref 22–32)
Calcium: 8.9 mg/dL (ref 8.9–10.3)
Chloride: 101 mmol/L (ref 101–111)
Creatinine, Ser: 0.98 mg/dL (ref 0.44–1.00)
GFR calc Af Amer: >60 mL/min (ref >60)
GFR calc non Af Amer: >60 mL/min (ref >60)
Glucose, Bld: 311 mg/dL — ABNORMAL HIGH (ref 65–99)
Potassium: 3.8 mmol/L (ref 3.5–5.1)
Sodium: 133 mmol/L — ABNORMAL LOW (ref 135–145)

## 2017-07-04 LAB — TROPONIN I: Troponin I: <0.03 ng/mL (ref <0.03)

## 2017-07-04 LAB — GLUCOSE, CAPILLARY
Glucose-Capillary: 371 mg/dL — ABNORMAL HIGH (ref 65–99)
Glucose-Capillary: 342 mg/dL — ABNORMAL HIGH (ref 65–99)
Glucose-Capillary: 260 mg/dL — ABNORMAL HIGH (ref 65–99)

## 2017-07-04 LAB — HIV ANTIBODY (ROUTINE TESTING W REFLEX): HIV Screen 4th Generation wRfx: NONREACTIVE

## 2017-07-04 MED ORDER — INSULIN ASPART 100 UNIT/ML ~~LOC~~ SOLN
2.0000 [IU] | Freq: Three times a day (TID) | SUBCUTANEOUS | Status: DC
  Administered 2017-07-04: 2 [IU] via SUBCUTANEOUS

## 2017-07-04 MED ORDER — INSULIN GLARGINE 100 UNIT/ML ~~LOC~~ SOLN
25.0000 [IU] | Freq: Every day | SUBCUTANEOUS | Status: DC
  Administered 2017-07-05: 25 [IU] via SUBCUTANEOUS
  Filled 2017-07-04: qty 0.25

## 2017-07-04 MED ORDER — SODIUM CHLORIDE 0.9 % IV SOLN
INTRAVENOUS | Status: DC
  Administered 2017-07-04 – 2017-07-05 (×2): via INTRAVENOUS

## 2017-07-04 MED ORDER — INSULIN ASPART 100 UNIT/ML ~~LOC~~ SOLN
5.0000 [IU] | Freq: Three times a day (TID) | SUBCUTANEOUS | Status: DC
  Administered 2017-07-04 – 2017-07-05 (×3): 5 [IU] via SUBCUTANEOUS

## 2017-07-04 NOTE — Progress Notes (Signed)
Routine EEG completed, results pending. 

## 2017-07-04 NOTE — Care Management Obs Status (Signed)
[facility] NOTIFICATION   Patient Details  Name: Lynn [name] MRN: [id number] Date of Birth: 03-27-1956   Medicare Observation Status Notification Given:  Yes    Dezmin Kittelson, Rory Percy, RN 07/04/2017, 5:17 PM

## 2017-07-04 NOTE — Progress Notes (Signed)
PROGRESS NOTE    Lynn [name]  ERX:[id number] DOB: 1955/11/10 DOA: 07/03/2017 PCP: [name], MD    Brief Narrative: Lynn [name] is a 61 y.o. female with medical history significant of nonobstructive coronary artery disease with recent NSTEMI and cardiac catheterization, diabetes, chronic diastolic congestive heart failure, hyperlipidemia, hypertension, microcytic anemia, OSA on CPAP, thalassemia comes in for dizziness, near syncope, palpitations, weakness and possible seizure activity.   Assessment & Plan:   Active Problems:   Coronary artery disease, non-occlusive   Syncope   Near syncope   Anxiety   Seizure-like activity (HCC)   Palpitations   Uncontrolled diabetes mellitus (HCC)   Near syncope, dizziness, diaphoresis, tachycardia, palpitations:  - differential include vasovagal response vs arrhythmia vs infection vs dehydration - EKG and troponin unremarkable.  - overnight telemetry shows; PVC'S .  - no signs of infection with negative UA, neg CXR.  - initial CT head negative.  - recommend getting EEG to evaluate for seizures.  - check orthostatic vital signs tomorrow.  - hydrate the patient.    Anemia : baseline hemoglobin around 9, on admission was 12, possibly from dehydration, hemoconcentration. Repeat H&H is back at 9.4 /29.4.  Microcytic.  Get anemia panel.     Uncontrolled diabetes mellitus:  CBG (last 3)   Recent Labs  07/03/17 1744 07/03/17 2047 07/04/17 1203  GLUCAP 330* 322* 371*    cbgs in 300's.  hgba1c is 10.8 On 25 units of lantus and added 5 units of novolog TIDAC.     Hypertension: bp;s blood parameters running low, would d/c cozaar, and continue with metoprolol with holding orders if SBP LESS THAN 100.    Hyperlipidemia:  Resume lipitor.     Weakness: possibly from deconditioning; get PT evaluation.   Acute renal failure:  Possibly from dehydration.  Gentle hydration and repeat    DVT prophylaxis: heparin  sq Code Status: (Full) Family Communication: none at bedside Disposition Plan: pending evaluation by EEG  Consultants:   none  Procedures:  EEG Antimicrobials: none  Subjective: Feels better today.   Objective: Vitals:   07/03/17 1751 07/03/17 2049 07/04/17 0459 07/04/17 0922  BP: (!) 114/56 129/65 (!) 98/51 (!) 99/51  Pulse: 67 80 66   Resp: 17 18 17 18   Temp: 98.6 F (37 C) 98.5 F (36.9 C) 98.6 F (37 C) 98.6 F (37 C)  TempSrc: Oral     SpO2: 100% 97% 100% 100%    Intake/Output Summary (Last 24 hours) at 07/04/17 1001 Last data filed at 07/04/17 0600  Gross per 24 hour  Intake             1120 ml  Output                0 ml  Net             1120 ml   There were no vitals filed for this visit.  Examination:  General exam: Appears calm and comfortable  Respiratory system: Clear to auscultation. Respiratory effort normal. Cardiovascular system: S1 & S2 heard, RRR. No JVD, murmurs, rubs, gallops or clicks. Gastrointestinal system: Abdomen is nondistended, soft and nontender. No organomegaly or masses felt. Normal bowel sounds heard. Central nervous system: Alert and oriented. No focal neurological deficits. Extremities: Symmetric 5 x 5 power. Skin: No rashes, lesions or ulcers Psychiatry: Judgement and insight appear normal. Mood & affect appropriate.     Data Reviewed: I have personally reviewed following labs and imaging studies  CBC:  Recent Labs Lab 07/03/17 0725 07/03/17 0814 07/04/17 0232  WBC 10.5  --  10.9*  HGB 9.5* 12.2 9.4*  HCT 30.6* 36.0 29.4*  MCV 59.6*  --  60.0*  PLT 273  --  938   Basic Metabolic Panel:  Recent Labs Lab 07/03/17 0725 07/03/17 0814 07/04/17 0232  NA 132* 134* 133*  K 3.6 4.1 3.8  CL 97* 97* 101  CO2 25  --  21*  GLUCOSE 352* 319* 311*  BUN 23* 26* 16  CREATININE 1.22* 1.00 0.98  CALCIUM 9.2  --  8.9   GFR: CrCl cannot be calculated (Unknown ideal weight.). Liver Function Tests:  Recent Labs Lab  07/03/17 0725  AST 24  ALT 33  ALKPHOS 97  BILITOT 0.3  PROT 7.3  ALBUMIN 3.8   No results for input(s): LIPASE, AMYLASE in the last 168 hours. No results for input(s): AMMONIA in the last 168 hours. Coagulation Profile: No results for input(s): INR, PROTIME in the last 168 hours. Cardiac Enzymes:  Recent Labs Lab 07/03/17 1657 07/03/17 2309  CKTOTAL 169  --   TROPONINI <0.03 <0.03   BNP (last 3 results) No results for input(s): PROBNP in the last 8760 hours. HbA1C:  Recent Labs  07/03/17 1657  HGBA1C 10.8*   CBG:  Recent Labs Lab 07/03/17 0711 07/03/17 1218 07/03/17 1744 07/03/17 2047  GLUCAP 320* 344* 330* 322*   Lipid Profile: No results for input(s): CHOL, HDL, LDLCALC, TRIG, CHOLHDL, LDLDIRECT in the last 72 hours. Thyroid Function Tests: No results for input(s): TSH, T4TOTAL, FREET4, T3FREE, THYROIDAB in the last 72 hours. Anemia Panel: No results for input(s): VITAMINB12, FOLATE, FERRITIN, TIBC, IRON, RETICCTPCT in the last 72 hours. Sepsis Labs:  Recent Labs Lab 07/03/17 0815  LATICACIDVEN 2.03*    No results found for this or any previous visit (from the past 240 hour(s)).       Radiology Studies: Ct Head Wo Contrast  Result Date: 07/03/2017 CLINICAL DATA:  Altered mental status EXAM: CT HEAD WITHOUT CONTRAST TECHNIQUE: Contiguous axial images were obtained from the base of the skull through the vertex without intravenous contrast. COMPARISON:  None. FINDINGS: Brain: Calcifications are noted in the cortex of the left posterior parietal lobe with associated atrophy. This is likely related to remote infection or infarct. No acute intracranial abnormality. Specifically, no hemorrhage, hydrocephalus, mass lesion, acute infarction, or significant intracranial injury. Vascular: No hyperdense vessel or unexpected calcification. Skull: No acute calvarial abnormality. Sinuses/Orbits: Visualized paranasal sinuses and mastoids clear. Orbital soft tissues  unremarkable. Other: None IMPRESSION: Cortical calcifications in the posterior left parietal region, likely related to remote infection or infarct. No acute intracranial abnormality. Electronically Signed   By: Rolm Baptise M.D.   On: 07/03/2017 08:41   Dg Chest Port 1 View  Result Date: 07/03/2017 CLINICAL DATA:  Possible syncope. Lethargy. Acute mental status changes. Single episode of diaphoresis and vomiting. EXAM: PORTABLE CHEST 1 VIEW COMPARISON:  02/21/2017, 11/16/2016 and earlier. FINDINGS: Suboptimal inspiration accounts for crowded bronchovascular markings, especially in the bases, and accentuates the cardiac silhouette. Taking this into account, cardiac silhouette upper normal in size. Lungs clear. Pulmonary vascularity normal. No visible pleural effusions. IMPRESSION: Suboptimal inspiration.  No acute cardiopulmonary disease. Electronically Signed   By: Evangeline Dakin M.D.   On: 07/03/2017 08:07        Scheduled Meds: . aspirin  81 mg Oral Daily  . atorvastatin  80 mg Oral q1800  . heparin  5,000 Units Subcutaneous Q8H  .  losartan  50 mg Oral Daily   And  . hydrochlorothiazide  12.5 mg Oral Daily  . Influenza vac split quadrivalent PF  0.5 mL Intramuscular Tomorrow-1000  . insulin aspart  0-5 Units Subcutaneous QHS  . insulin aspart  0-9 Units Subcutaneous TID WC  . insulin glargine  20 Units Subcutaneous Q breakfast  . metoprolol tartrate  50 mg Oral BID   Continuous Infusions:   LOS: 0 days    Time spent: 35 minutes.     Hosie Poisson, MD [facility] Pager [phone]  If 7PM-7AM, please contact night-coverage [email or web address] Password [email or web address] 07/04/2017, 10:01 AM

## 2017-07-04 NOTE — Progress Notes (Signed)
Inpatient Diabetes Program Recommendations  AACE/ADA: New Consensus Statement on Inpatient Glycemic Control (2015)  Target Ranges:  Prepandial:   less than 140 mg/dL      Peak postprandial:   less than 180 mg/dL (1-2 hours)      Critically ill patients:  140 - 180 mg/dL   Lab Results  Component Value Date   GLUCAP 371 (H) 07/04/2017   HGBA1C 10.8 (H) 07/03/2017    Review of Glycemic Control Results for Lynn [name], Lynn [name] (MRN [id number]) as of 07/04/2017 12:45  Ref. Range 07/03/2017 07:11 07/03/2017 12:18 07/03/2017 17:44 07/03/2017 20:47 07/04/2017 12:03  Glucose-Capillary Latest Ref Range: 65 - 99 mg/dL 320 (H) 344 (H) 330 (H) 322 (H) 371 (H)   Diabetes history: DM2 Outpatient Diabetes medications: Lantus 13 units + Glucotrol 5 mg bid Current orders for Inpatient glycemic control: Lantus 20 units + Novolog correction 0-9 units tid + 0-5 units hs  Inpatient Diabetes Program Recommendations:    Please consider -Increase lantus to 25 units -Add Novolog meal coverage 5 units tid meal coverage  Spoke with pt and husband @ bedside about A1C results 10.8 (average CBG 283 over the past 2-3 months) with them and explained what an A1C is, basic pathophysiology of DM Type 2, basic home care, basic diabetes diet nutrition principles, importance of checking CBGs and maintaining good CBG control to prevent long-term and short-term complications. Reviewed signs and symptoms of hyperglycemia and hypoglycemia and how to treat hypoglycemia at home. Also reviewed blood sugar goals at home.  RNs to provide ongoing basic DM education at bedside with this patient.  Noted dietician to see pt. Today.  Thank you, Nani Gasser. Barnie Sopko, RN, MSN, CDE  Diabetes Coordinator Inpatient Glycemic Control Team Team Pager [phone] (8am-5pm) 07/04/2017 12:48 PM

## 2017-07-04 NOTE — Plan of Care (Signed)
Problem: Food- and Nutrition-Related Knowledge Deficit (NB-1.1) Goal: Nutrition education Formal process to instruct or train a patient/client in a skill or to impart knowledge to help patients/clients voluntarily manage or modify food choices and eating behavior to maintain or improve health.  Outcome: Completed/Met Date Met: 07/04/17  RD consulted for nutrition education regarding diabetes. Husband at bedside. Pt with hx of DM but reports no prior diet education.   Lab Results  Component Value Date   HGBA1C 10.8 (H) 07/03/2017    RD provided "Carbohydrate Counting for People with Diabetes" handout from the Academy of [employer]etics. Discussed different food groups and their effects on blood sugar, emphasizing carbohydrate-containing foods. Provided list of carbohydrates and recommended serving sizes of common foods.  Discussed importance of controlled and consistent carbohydrate intake throughout the day. Provided examples of ways to balance meals/snacks and encouraged intake of high-fiber, whole grain complex carbohydrates. Teach back method used.   Expect good compliance. Pt very receptive to education, many questions and appeared to have good grasp of diabetic diet post education.   Current diet order is Carb Modified, patient is consuming approximately 100% of meals at this time. Labs and medications reviewed. No further nutrition interventions warranted at this time. RD contact information provided. If additional nutrition issues arise, please re-consult RD.  Kerman Passey MS, RD, LDN [phone] Pager  [phone] Weekend/On-Call Pager

## 2017-07-04 NOTE — Procedures (Signed)
Date of recording 07/04/2017  Referring physician [facility]  Technical Multichannel Digital EEG recording using 10-20 international electrode system  Reason for the study 61 year old female presented with near syncope   Description of the recording Posterior dominant rhythm is 9-10 Hz symmetrical reactive and well sustained. Photic stimulation and hyperventilation did not produce any abnormal responses. Non-REM stage II sleep was obtained vertex transients and sleep spindles were seen. No localizing, lateralizing, interictal epileptiform features were seen during this recording.  Impression  The EEG is normal with patient recorded in awake and sleep states.

## 2017-07-05 DIAGNOSIS — I10 Essential (primary) hypertension: Secondary | ICD-10-CM

## 2017-07-05 DIAGNOSIS — E0800 Diabetes mellitus due to underlying condition with hyperosmolarity without nonketotic hyperglycemic-hyperosmolar coma (NKHHC): Secondary | ICD-10-CM | POA: Diagnosis not present

## 2017-07-05 DIAGNOSIS — E785 Hyperlipidemia, unspecified: Secondary | ICD-10-CM | POA: Diagnosis not present

## 2017-07-05 DIAGNOSIS — Z794 Long term (current) use of insulin: Secondary | ICD-10-CM | POA: Diagnosis not present

## 2017-07-05 DIAGNOSIS — I2510 Atherosclerotic heart disease of native coronary artery without angina pectoris: Secondary | ICD-10-CM | POA: Diagnosis not present

## 2017-07-05 DIAGNOSIS — R55 Syncope and collapse: Secondary | ICD-10-CM | POA: Diagnosis not present

## 2017-07-05 LAB — CBC
HCT: 28.9 % — ABNORMAL LOW (ref 36.0–46.0)
Hemoglobin: 8.6 g/dL — ABNORMAL LOW (ref 12.0–15.0)
MCH: 17.9 pg — ABNORMAL LOW (ref 26.0–34.0)
MCHC: 29.8 g/dL — ABNORMAL LOW (ref 30.0–36.0)
MCV: 60.1 fL — ABNORMAL LOW (ref 78.0–100.0)
Platelets: 261 10*3/uL (ref 150–400)
RBC: 4.81 MIL/uL (ref 3.87–5.11)
RDW: 16.5 % — ABNORMAL HIGH (ref 11.5–15.5)
WBC: 8.8 10*3/uL (ref 4.0–10.5)

## 2017-07-05 LAB — RETICULOCYTES
RBC.: 4.81 MIL/uL (ref 3.87–5.11)
Retic Count, Absolute: 96.2 10*3/uL (ref 19.0–186.0)
Retic Ct Pct: 2 % (ref 0.4–3.1)

## 2017-07-05 LAB — IRON AND TIBC
Iron: 51 ug/dL (ref 28–170)
Saturation Ratios: 16 % (ref 10.4–31.8)
TIBC: 311 ug/dL (ref 250–450)
UIBC: 260 ug/dL

## 2017-07-05 LAB — VITAMIN B12: Vitamin B-12: 388 pg/mL (ref 180–914)

## 2017-07-05 LAB — GLUCOSE, CAPILLARY
Glucose-Capillary: 263 mg/dL — ABNORMAL HIGH (ref 65–99)
Glucose-Capillary: 438 mg/dL — ABNORMAL HIGH (ref 65–99)
Glucose-Capillary: 354 mg/dL — ABNORMAL HIGH (ref 65–99)

## 2017-07-05 LAB — FOLATE: Folate: 31 ng/mL (ref >5.9)

## 2017-07-05 LAB — LACTIC ACID, PLASMA: Lactic Acid, Venous: 1.1 mmol/L (ref 0.5–1.9)

## 2017-07-05 LAB — FERRITIN: Ferritin: 68 ng/mL (ref 11–307)

## 2017-07-05 MED ORDER — GLIPIZIDE 5 MG PO TABS
5.0000 mg | ORAL_TABLET | Freq: Two times a day (BID) | ORAL | Status: DC
  Filled 2017-07-05: qty 1

## 2017-07-05 MED ORDER — LIRAGLUTIDE 18 MG/3ML ~~LOC~~ SOPN: 1.2000 mg | PEN_INJECTOR | Freq: Every day | SUBCUTANEOUS | Status: DC

## 2017-07-05 MED ORDER — INSULIN GLARGINE 100 UNIT/ML ~~LOC~~ SOLN
30.0000 [IU] | Freq: Every day | SUBCUTANEOUS | Status: DC
  Filled 2017-07-05: qty 0.3

## 2017-07-05 MED ORDER — INSULIN GLARGINE 100 UNIT/ML ~~LOC~~ SOLN
25.0000 [IU] | Freq: Every day | SUBCUTANEOUS | Status: DC
  Filled 2017-07-05: qty 0.25

## 2017-07-05 MED ORDER — INSULIN GLARGINE 100 UNIT/ML SOLOSTAR PEN: 30.0000 [IU] | PEN_INJECTOR | Freq: Every day | SUBCUTANEOUS | 11 refills | Status: DC

## 2017-07-05 MED ORDER — LOSARTAN POTASSIUM 50 MG PO TABS: 50.0000 mg | ORAL_TABLET | Freq: Every day | ORAL | 11 refills | Status: DC

## 2017-07-05 MED ORDER — INSULIN GLARGINE 100 UNIT/ML SOLOSTAR PEN: 25.0000 [IU] | PEN_INJECTOR | Freq: Every day | SUBCUTANEOUS | 11 refills | Status: DC

## 2017-07-05 NOTE — Progress Notes (Signed)
D/C instructions, RX's and follow up appts explained and provided to patient, patient verbalized understanding. Patient left floor via wheelchair accompanied by staff no c/o pain or shortness of breath at d/c.  Chinonso Linker, Tivis Ringer, RN

## 2017-07-05 NOTE — Evaluation (Signed)
Physical Therapy Evaluation/ Discharge Patient Details Name: Lynn [name] MRN: [id number] DOB: 09-Jun-1956 Today's Date: 07/05/2017   History of Present Illness  61 yo admitted after syncope at home with dehydration. PMHx: DM, HTN, CAD, obestity, bil TKA  Clinical Impression  Pt very pleasant and moving well with no apparent deficits. Pt able to complete all transfers, gait and mobility without assist or LOB. No dizziness or deficits at this time and pt safe for D/C. Pt aware and agreeable of no further needs. Signing off.     Follow Up Recommendations No PT follow up    Equipment Recommendations  None recommended by PT    Recommendations for Other Services       Precautions / Restrictions Precautions Precautions: None      Mobility  Bed Mobility Overal bed mobility: Independent                Transfers Overall transfer level: Independent                  Ambulation/Gait Ambulation/Gait assistance: Independent Ambulation Distance (Feet): 450 Feet Assistive device: None Gait Pattern/deviations: WFL(Within Functional Limits)   Gait velocity interpretation: at or above normal speed for age/gender General Gait Details: pt able to complete head turns and change of speed without difficulty or dizziness  Stairs            Wheelchair Mobility    Modified Rankin (Stroke Patients Only)       Balance Overall balance assessment: No apparent balance deficits (not formally assessed)                                           Pertinent Vitals/Pain Pain Assessment: No/denies pain    Home Living Family/patient expects to be discharged to:: Private residence Living Arrangements: Spouse/significant other Available Help at Discharge: Family;Available 24 hours/day Type of Home: House Home Access: Stairs to enter Entrance Stairs-Rails: Can reach both Entrance Stairs-Number of Steps: 5 Home Layout: Two level;Bed/bath upstairs Home  Equipment: Walker - 2 wheels;Bedside commode;Shower seat      Prior Function Level of Independence: Independent               Hand Dominance        Extremity/Trunk Assessment   Upper Extremity Assessment Upper Extremity Assessment: Overall WFL for tasks assessed    Lower Extremity Assessment Lower Extremity Assessment: Overall WFL for tasks assessed    Cervical / Trunk Assessment Cervical / Trunk Assessment: Normal  Communication   Communication: No difficulties  Cognition Arousal/Alertness: Awake/alert Behavior During Therapy: WFL for tasks assessed/performed Overall Cognitive Status: Within Functional Limits for tasks assessed                                        General Comments      Exercises     Assessment/Plan    PT Assessment Patent does not need any further PT services  PT Problem List         PT Treatment Interventions      PT Goals (Current goals can be found in the Care Plan section)  Acute Rehab PT Goals PT Goal Formulation: All assessment and education complete, DC therapy    Frequency     Barriers to discharge  Co-evaluation               AM-PAC PT "6 Clicks" Daily Activity  Outcome Measure Difficulty turning over in bed (including adjusting bedclothes, sheets and blankets)?: None Difficulty moving from lying on back to sitting on the side of the bed? : None Difficulty sitting down on and standing up from a chair with arms (e.g., wheelchair, bedside commode, etc,.)?: None Help needed moving to and from a bed to chair (including a wheelchair)?: None Help needed walking in hospital room?: None Help needed climbing 3-5 steps with a railing? : None 6 Click Score: 24    End of Session Equipment Utilized During Treatment: Gait belt Activity Tolerance: Patient tolerated treatment well Patient left: in chair;with call bell/phone within reach Nurse Communication: Mobility status PT Visit Diagnosis:  Difficulty in walking, not elsewhere classified (R26.2)    Time: 0981-1914 PT Time Calculation (min) (ACUTE ONLY): 13 min   Charges:   PT Evaluation $PT Eval Low Complexity: 1 Low     PT G Codes:   PT G-Codes **NOT FOR INPATIENT CLASS** Functional Assessment Tool Used: AM-PAC 6 Clicks Basic Mobility Functional Limitation: Mobility: Walking and moving around Mobility: Walking and Moving Around Current Status (N8295): 0 percent impaired, limited or restricted Mobility: Walking and Moving Around Goal Status (A2130): 0 percent impaired, limited or restricted Mobility: Walking and Moving Around Discharge Status (Q6578): 0 percent impaired, limited or restricted    Elwyn Reach, PT [place] 07/05/2017, 12:16 PM

## 2017-07-05 NOTE — Discharge Summary (Signed)
Physician Discharge Summary  Lynn [name] HFW:[id number] DOB: [birth year] DOA: 07/03/2017  PCP: [name], MD  Admit date: 07/03/2017 Discharge date: 07/05/2017  Admitted From: home  Disposition:  home   Recommendations for Outpatient Follow-up:  1. Close f/u in regards to weight loss and diabetes control- will need to be on short acting insulin as well- will leave this to PCP to discuss at next visit- I have discussed it with the patient- may need endocrine f/u   Discharge Condition:  stable   CODE STATUS:  Full code   Consultations:  none    Discharge Diagnoses:  Principal Problem:   Near syncope Active Problems:   Uncontrolled diabetes mellitus ([place])   Essential hypertension   Hyperlipidemia with target low density lipoprotein (LDL) cholesterol less than 100 mg/dL   Coronary artery disease, non-occlusive   Anxiety  Morbid obesity   Subjective: No complaints today. No dizziness or feeling faint.   Brief Summary: 61 y/o female with DM 2, HTN, HLD, CAD (NSTEMI), OSA presented with a syncopal episode. On day of admission patient reports waking up at 05:00 with the urge to go to bathroom. Upon standing and may be related to the bathroom patient became very lightheaded and felt like she was going to pass out. Patient sat down on the toilet and woke up approximately 1 hour later at 06:00. She said that her head was resting on the sink to the side of the toilet but she was still sitting on the toilet. Patient states that she was sweaty at the time, was unable to get off the toilet due to weakness and then had a bowel movement. At approximately 06:30 patient was finally able to get off the toilet. Patient checked her sugar level which was 260. Patient laid down on the bed and took a nitroglycerin with no change in her sensation of heart racing or shortness of breath.  Whle in Triage she became lethargic again with "tremors of arms and legs" and also had some vomiting.  BP  96/56.  Admits to sugars as high as 3-400 at home and frequent micturation.   Hospital Course:  Syncope - likely due to dehydration from chronically elevated sugars and HCTZ - seizure like activity likely "convulsive syncope"- EEG, head CT unrevealing.   DM uncontrolled - Lantus recently started- per her physician's orders, she had titrated it up to 13 U - I have increased her Lantus up to 25 U today- Glipizide on hold- will be resumed today- sugars should hopefully come down to < 200 with these adjustments - cont Vicotza - she is advised to continue to record her sugars as she has been and call her PCP if sugars are persistently > 200 - she will also be titrating up the Lantus slowly at home as needed - need close f/u with PCP- have discussed this with her  HTN - HCTZ has been discontinued for now while she is at risk for dehydration from high sugars - cont other home meds   CAD - no changes in meds made  Morbid obesity - strictly advised to lose weight to control and prevent other complications including DM and HTN Body mass index is 42.87 kg/m.   Discharge Instructions  Discharge Instructions    Ambulatory referral to Nutrition and Diabetic Education    Complete by:  As directed    Type 2 with elevated A1c     Allergies as of 07/05/2017      Reactions   Penicillins Hives  Has patient had a PCN reaction causing immediate rash, facial/tongue/throat swelling, SOB or lightheadedness with hypotension: Yes Has patient had a PCN reaction causing severe rash involving mucus membranes or skin necrosis: No Has patient had a PCN reaction that required hospitalization No Has patient had a PCN reaction occurring within the last 10 years: No If all of the above answers are "NO", then may proceed with Cephalosporin use.   Metformin And Related    Diarrhea, bleeding      Medication List    STOP taking these medications   losartan-hydrochlorothiazide 50-12.5 MG tablet Commonly  known as:  HYZAAR     TAKE these medications   ACCU-CHEK AVIVA PLUS w/Device Kit   aspirin 81 MG chewable tablet Chew 1 tablet (81 mg total) by mouth daily.   atorvastatin 80 MG tablet Commonly known as:  LIPITOR Take 1 tablet (80 mg total) by mouth daily at 6 PM.   blood glucose meter kit and supplies Kit Use up to four times daily as directed.Dx: E11.9   cetirizine 10 MG tablet Commonly known as:  ZYRTEC Take 10 mg by mouth daily as needed for allergies.   folic acid 1 MG tablet Commonly known as:  FOLVITE Take 2 tablets (2 mg total) by mouth daily. What changed:  how much to take  when to take this   glipiZIDE 5 MG tablet Commonly known as:  GLUCOTROL Take 1 tablet (5 mg total) by mouth 2 (two) times daily before a meal.   glucose blood test strip Use up to four times daily as directly. Dx E11.9   Insulin Glargine 100 UNIT/ML Solostar Pen Commonly known as:  LANTUS Inject 25 Units into the skin daily. What changed:  how much to take  how to take this  when to take this  additional instructions   liraglutide 18 MG/3ML Sopn Commonly known as:  VICTOZA START WITH 0.6 MG INJECTED [place] DAILY FOR ONE WEEK, THEN INCREASE TO 1.2 MG DAILY   losartan 50 MG tablet Commonly known as:  COZAAR Take 1 tablet (50 mg total) by mouth daily.   metoprolol tartrate 25 MG tablet Commonly known as:  LOPRESSOR Take 1 tablet (25 mg total) by mouth daily as needed. If break thorough fast heart beat occur   metoprolol tartrate 50 MG tablet Commonly known as:  LOPRESSOR Take 1 tablet (50 mg total) by mouth 2 (two) times daily.   multivitamin with minerals Tabs tablet Take 1 tablet by mouth daily. Reported on 01/03/2016   nitroGLYCERIN 0.4 MG SL tablet Commonly known as:  NITROSTAT PLACE 1 TABLET UNDER THE TONGUE EVERY 5MIN AS NEEDED FOR 3 DOSES FOR CHEST PAIN   Pen Needles 31G X 5 MM Misc 1 Units by Does not apply route daily. For use with victoza pen   PRESCRIPTION  MEDICATION Inhale into the lungs at bedtime. CPAP            Discharge Care Instructions        Start     Ordered   07/05/17 0000  losartan (COZAAR) 50 MG tablet  Daily     07/05/17 0945   07/05/17 0000  Insulin Glargine (LANTUS) 100 UNIT/ML Solostar Pen  Daily    Comments:  [place] for pharmacy to sub any brand which is preferred by pt's insurance for cost benefit   07/05/17 0947   07/04/17 0000  Ambulatory referral to Nutrition and Diabetic Education    Comments:  Type 2 with elevated A1c  07/04/17 1243      Allergies  Allergen Reactions  . Penicillins Hives    Has patient had a PCN reaction causing immediate rash, facial/tongue/throat swelling, SOB or lightheadedness with hypotension: Yes Has patient had a PCN reaction causing severe rash involving mucus membranes or skin necrosis: No Has patient had a PCN reaction that required hospitalization No Has patient had a PCN reaction occurring within the last 10 years: No If all of the above answers are "NO", then may proceed with Cephalosporin use.  . Metformin And Related     Diarrhea, bleeding     Procedures/Studies:    Ct Head Wo Contrast  Result Date: 07/03/2017 CLINICAL DATA:  Altered mental status EXAM: CT HEAD WITHOUT CONTRAST TECHNIQUE: Contiguous axial images were obtained from the base of the skull through the vertex without intravenous contrast. COMPARISON:  None. FINDINGS: Brain: Calcifications are noted in the cortex of the left posterior parietal lobe with associated atrophy. This is likely related to remote infection or infarct. No acute intracranial abnormality. Specifically, no hemorrhage, hydrocephalus, mass lesion, acute infarction, or significant intracranial injury. Vascular: No hyperdense vessel or unexpected calcification. Skull: No acute calvarial abnormality. Sinuses/Orbits: Visualized paranasal sinuses and mastoids clear. Orbital soft tissues unremarkable. Other: None IMPRESSION: Cortical calcifications  in the posterior left parietal region, likely related to remote infection or infarct. No acute intracranial abnormality. Electronically Signed   By: Rolm Baptise M.D.   On: 07/03/2017 08:41   Dg Chest Port 1 View  Result Date: 07/03/2017 CLINICAL DATA:  Possible syncope. Lethargy. Acute mental status changes. Single episode of diaphoresis and vomiting. EXAM: PORTABLE CHEST 1 VIEW COMPARISON:  02/21/2017, 11/16/2016 and earlier. FINDINGS: Suboptimal inspiration accounts for crowded bronchovascular markings, especially in the bases, and accentuates the cardiac silhouette. Taking this into account, cardiac silhouette upper normal in size. Lungs clear. Pulmonary vascularity normal. No visible pleural effusions. IMPRESSION: Suboptimal inspiration.  No acute cardiopulmonary disease. Electronically Signed   By: Evangeline Dakin M.D.   On: 07/03/2017 08:07       Discharge Exam: Vitals:   07/05/17 0643 07/05/17 0730  BP: (!) 136/46 119/60  Pulse:  73  Resp:  18  Temp:  97.9 F (36.6 C)  SpO2:  95%   Vitals:   07/04/17 2042 07/05/17 0625 07/05/17 0643 07/05/17 0730  BP: (!) 132/55 (!) 95/37 (!) 136/46 119/60  Pulse: 72 70  73  Resp: '18 18  18  '$ Temp: 98.6 F (37 C) 97.9 F (36.6 C)  97.9 F (36.6 C)  TempSrc: Oral Oral  Oral  SpO2: 99% 93%  95%  Weight: 109.8 kg (242 lb)     Height: '5\' 3"'$  (1.6 m)       General: Pt is alert, awake, not in acute distress Cardiovascular: RRR, S1/S2 +, no rubs, no gallops Respiratory: CTA bilaterally, no wheezing, no rhonchi Abdominal: Soft, NT, ND, bowel sounds + Extremities: no edema, no cyanosis    The results of significant diagnostics from this hospitalization (including imaging, microbiology, ancillary and laboratory) are listed below for reference.     Microbiology: No results found for this or any previous visit (from the past 240 hour(s)).   Labs: BNP (last 3 results) No results for input(s): BNP in the last 8760 hours. Basic Metabolic  Panel:  Recent Labs Lab 07/03/17 0725 07/03/17 0814 07/04/17 0232  NA 132* 134* 133*  K 3.6 4.1 3.8  CL 97* 97* 101  CO2 25  --  21*  GLUCOSE 352* 319*  311*  BUN 23* 26* 16  CREATININE 1.22* 1.00 0.98  CALCIUM 9.2  --  8.9   Liver Function Tests:  Recent Labs Lab 07/03/17 0725  AST 24  ALT 33  ALKPHOS 97  BILITOT 0.3  PROT 7.3  ALBUMIN 3.8   No results for input(s): LIPASE, AMYLASE in the last 168 hours. No results for input(s): AMMONIA in the last 168 hours. CBC:  Recent Labs Lab 07/03/17 0725 07/03/17 0814 07/04/17 0232 07/05/17 0227  WBC 10.5  --  10.9* 8.8  HGB 9.5* 12.2 9.4* 8.6*  HCT 30.6* 36.0 29.4* 28.9*  MCV 59.6*  --  60.0* 60.1*  PLT 273  --  241 261   Cardiac Enzymes:  Recent Labs Lab 07/03/17 1657 07/03/17 2309  CKTOTAL 169  --   TROPONINI <0.03 <0.03   BNP: Invalid input(s): POCBNP CBG:  Recent Labs Lab 07/03/17 2047 07/04/17 1203 07/04/17 1559 07/04/17 2049 07/05/17 0727  GLUCAP 322* 371* 342* 260* 263*   D-Dimer  Recent Labs  07/03/17 0750  DDIMER 0.48   Hgb A1c  Recent Labs  07/03/17 1657  HGBA1C 10.8*   Lipid Profile No results for input(s): CHOL, HDL, LDLCALC, TRIG, CHOLHDL, LDLDIRECT in the last 72 hours. Thyroid function studies No results for input(s): TSH, T4TOTAL, T3FREE, THYROIDAB in the last 72 hours.  Invalid input(s): FREET3 Anemia work up  Recent Labs  07/05/17 0227  VITAMINB12 388  FOLATE 31.0  FERRITIN 68  TIBC 311  IRON 51  RETICCTPCT 2.0   Urinalysis    Component Value Date/Time   COLORURINE YELLOW 07/03/2017 1029   APPEARANCEUR HAZY (A) 07/03/2017 1029   LABSPEC 1.015 07/03/2017 1029   PHURINE 5.5 07/03/2017 1029   GLUCOSEU 250 (A) 07/03/2017 1029   HGBUR NEGATIVE 07/03/2017 1029   BILIRUBINUR NEGATIVE 07/03/2017 1029   KETONESUR 15 (A) 07/03/2017 1029   PROTEINUR NEGATIVE 07/03/2017 1029   UROBILINOGEN 0.2 08/18/2008 1535   NITRITE NEGATIVE 07/03/2017 1029   LEUKOCYTESUR  NEGATIVE 07/03/2017 1029   Sepsis Labs Invalid input(s): PROCALCITONIN,  WBC,  LACTICIDVEN Microbiology No results found for this or any previous visit (from the past 240 hour(s)).   Time coordinating discharge: Over 30 minutes  SIGNED:   Debbe Odea, MD  [facility] 07/05/2017, 10:03 AM Pager   If 7PM-7AM, please contact night-coverage [email or web address] Password [email or web address]

## 2017-07-08 ENCOUNTER — Telehealth: Payer: Self-pay | Admitting: Behavioral Health

## 2017-07-08 NOTE — Telephone Encounter (Signed)
Transition Care Management Follow-up Telephone Call  PCP: [name], MD  Admit date: 07/03/2017 Discharge date: 07/05/2017  Admitted From: home  Disposition:  home   Recommendations for Outpatient Follow-up:  1. Close f/u in regards to weight loss and diabetes control- will need to be on short acting insulin as well- will leave this to PCP to discuss at next visit- I have discussed it with the patient- may need endocrine f/u   Discharge Condition:  stable     How have you been since you were released from the hospital? Patient stated, "I'm doing a lot better, my sugars are under control & more consistent".   Do you understand why you were in the hospital? yes, patient voiced that she was severely dehydrated & fell out.   Do you understand the discharge instructions? yes   Where were you discharged to? Home   Items Reviewed:  Medications reviewed: yes  Allergies reviewed: yes  Dietary changes reviewed: yes, carb-modified diet  Referrals reviewed: yes, follow-up with PCP.   Functional Questionnaire:   Activities of Daily Living (ADLs):   She states they are independent in the following: ambulation, bathing and hygiene, feeding, continence, grooming, toileting and dressing States they require assistance with the following: None   Any transportation issues/concerns?: no   Any patient concerns? no   Confirmed importance and date/time of follow-up visits scheduled yes, 07/10/17 at 11:45 AM.  Provider Appointment booked with Dr. Lorelei Pont.  Confirmed with patient if condition begins to worsen call PCP or go to the ER.  Patient was given the office number and encouraged to call back with question or concerns.  : yes

## 2017-07-10 ENCOUNTER — Encounter: Payer: Self-pay | Admitting: Family Medicine

## 2017-07-10 ENCOUNTER — Inpatient Hospital Stay: Payer: Medicare Other | Admitting: Family Medicine

## 2017-07-10 ENCOUNTER — Ambulatory Visit (INDEPENDENT_AMBULATORY_CARE_PROVIDER_SITE_OTHER): Payer: Medicare Other | Admitting: Family Medicine

## 2017-07-10 VITALS — BP 113/76 | HR 71 | Temp 97.9°F | Ht 63.0 in | Wt 243.4 lb

## 2017-07-10 DIAGNOSIS — I10 Essential (primary) hypertension: Secondary | ICD-10-CM

## 2017-07-10 DIAGNOSIS — Z09 Encounter for follow-up examination after completed treatment for conditions other than malignant neoplasm: Secondary | ICD-10-CM

## 2017-07-10 DIAGNOSIS — IMO0001 Reserved for inherently not codable concepts without codable children: Secondary | ICD-10-CM

## 2017-07-10 DIAGNOSIS — E669 Obesity, unspecified: Secondary | ICD-10-CM

## 2017-07-10 DIAGNOSIS — E1165 Type 2 diabetes mellitus with hyperglycemia: Secondary | ICD-10-CM

## 2017-07-10 LAB — BASIC METABOLIC PANEL
BUN: 18 mg/dL (ref 6–23)
CO2: 27 mEq/L (ref 19–32)
Calcium: 9.8 mg/dL (ref 8.4–10.5)
Chloride: 102 mEq/L (ref 96–112)
Creatinine, Ser: 1.1 mg/dL (ref 0.40–1.20)
GFR: 64.42 mL/min (ref >60.00)
Glucose, Bld: 256 mg/dL — ABNORMAL HIGH (ref 70–99)
Potassium: 3.9 mEq/L (ref 3.5–5.1)
Sodium: 137 mEq/L (ref 135–145)

## 2017-07-10 NOTE — Patient Instructions (Addendum)
It was good to see you today- I am so glad that you are feeling better!    Please see me in about 3 months and we will check your A1c Please send me a mychart message in 1-2 weeks with an update regarding your blood sugars and insulin dose.  If you are not feeling ok or if you are having any blood sugar troubles please do alert me!  I think you may be due for your annual eye exam

## 2017-07-10 NOTE — Progress Notes (Signed)
[place] at [facility] [address] [place], [place] [address] [phone]  Date:  07/10/2017   Name:  Lynn [name]   DOB:  October 12, 1956   MRN:  [id number]  PCP:  [name], MD    Chief Complaint: Hospitalization Follow-up   History of Present Illness:  Lynn [name] is a 61 y.o. very pleasant female patient who presents with the following:  Here today for a hospital follow-up visit:  Admit date: 07/03/2017 Discharge date: 07/05/2017  Recommendations for Outpatient Follow-up:  1. Close f/u in regards to weight loss and diabetes control- will need to be on short acting insulin as well- will leave this to PCP to discuss at next visit- I have discussed it with the patient- may need endocrine f/u  Discharge Diagnoses:  Principal Problem:   Near syncope Active Problems:   Uncontrolled diabetes mellitus ([place])   Essential hypertension   Hyperlipidemia with target low density lipoprotein (LDL) cholesterol less than 100 mg/dL   Coronary artery disease, non-occlusive   Anxiety  Morbid obesity   Subjective: No complaints today. No dizziness or feeling faint.   Brief Summary: 61 y/o female with DM 2, HTN, HLD, CAD (NSTEMI), OSA presented with a syncopal episode. On day of admission patient reports waking up at 05:00 with the urge to go to bathroom. Upon standing and may be related to the bathroom patient became very lightheaded and felt like she was going to pass out. Patient sat down on the toilet and woke up approximately 1 hour later at 06:00. She said that her head was resting on the sink to the side of the toilet but she was still sitting on the toilet. Patient states that she was sweaty at the time, was unable to get off the toilet due to weakness and then had a bowel movement. At approximately 06:30 patient was finally able to get off the toilet. Patient checked her sugar level which was 260. Patient laid down  on the bed and took a nitroglycerin with no change in her sensation of heart racing or shortness of breath.  Whle in Triage she became lethargic again with "tremors of arms and legs" and also had some vomiting.  BP 96/56.  Admits to sugars as high as 3-400 at home and frequent micturation.   Hospital Course:  Syncope - likely due to dehydration from chronically elevated sugars and HCTZ - seizure like activity likely "convulsive syncope"- EEG, head CT unrevealing. DM uncontrolled - Lantus recently started- per her physician's orders, she had titrated it up to 13 U - I have increased her Lantus up to 25 U today- Glipizide on hold- will be resumed today- sugars should hopefully come down to < 200 with these adjustments - cont Vicotza - she is advised to continue to record her sugars as she has been and call her PCP if sugars are persistently > 200 - she will also be titrating up the Lantus slowly at home as needed - need close f/u with PCP- have discussed this with he HTN - HCTZ has been discontinued for now while she is at risk for dehydration from high sugars - cont other home meds  CAD - no changes in meds made  Morbid obesity - strictly advised to lose weight to control and prevent other complications including DM and HTN Body mass index is 42.87 kg/m.   Discharge Instructions      Discharge Instructions    Ambulatory  referral to Nutrition and Diabetic Education    Complete by:  As directed    Type 2 with elevated A1c   Lab Results  Component Value Date   HGBA1C 10.8 (H) 07/03/2017   Here today for a follow-up visit.  She had a syncopal episode due to ?dehydration, uncontrolled glucose.  She was NOT diagnosed with a seizure disorder, EEG was normal She reports that she re-started her victoza when she got home from the hospital.  She had stopped it mistakenly, did not realize she was supposed to continue using it.  She is on 13 units of lantus right now. She is  checking her glucose frequently at home, and is running "great, now." She reports that her HS glucose may be as low as 129.  Fasting glucose this morning was 149 She is taking glipizide when her glucose is high, but will skip it when she is low as she is afraid of getting too low  She is back exercising now, and is drinking plenty of water.    Overall she feels much better. She has started exercising- mostly cardio-for up to about 30 minutes a day  She feels fine after exercising, no issues with hypoglycemia Too early to repeat her A1c today  Patient Active Problem List   Diagnosis Date Noted  . Near syncope 07/03/2017  . Anxiety 07/03/2017  . Uncontrolled diabetes mellitus ([place]) 07/03/2017  . Coronary artery disease, non-occlusive 12/08/2016  . PAT (paroxysmal atrial tachycardia) ([place])   . Non-STEMI (non-ST elevated myocardial infarction) ([place]) 11/16/2016  . H/O total knee replacement 08/19/2015  . Vision, loss, sudden 03/07/2015  . S/P TKR (total knee replacement) using cement 02/18/2015  . Thalassemia 09/02/2013  . Hyperlipidemia with target low density lipoprotein (LDL) cholesterol less than 100 mg/dL 08/24/2012  . Diabetes mellitus ([place]) 07/22/2012  . Essential hypertension 07/22/2012    Past Medical History:  Diagnosis Date  . Anginal pain ([place])   . Anxiety   . Arthritis    "knees, wrists, back, elbows" (07/03/2017)  . Clotting disorder ([place])    blood clot in eye 2016  . Coronary artery disease, non-occlusive    a. 11/2016 NSTEMI/Cath: LM nl, LAD 40p, 40m, D1/2 small, LCX 40ost, OM2/3 nl/small, RCA 35 ost/mid, RPDA/RPL small/nl.  . Depression   . Diastolic dysfunction    a. 11/2016 Echo: EF 55-60%, gr1 DD, sev calcified MV annulus, mildly dil LA.  .Marland KitchenEye hemorrhage 01/2015   "right; resolved" (07/03/2017)  . Headache    "monthly" (07/03/2017)  . Heart murmur   . History of blood transfusion    "when I had an ectopic pregnancy"  . History of stomach ulcers   .  Hyperlipidemia   . Hypertension   . Microcytic anemia   . OSA on CPAP   . Pneumonia    "several times" (07/03/2017)  . Seizures ([place] 07/03/2017 X 2  . Thalassemia    "my cells are sickle cell shaped but I don't have sickle cell anemia" (07/03/2017)  . Type II diabetes mellitus ([place]     Past Surgical History:  Procedure Laterality Date  . 48-Hour Monitor  03/18/2017   Sinus rhythm with sinus tachycardia (rate 58-134 BPM)multiple PVCs noted with couplets and bigeminy. One triplet. 7 runs of PAT ranging from 100-130 bpm. Longest was 33 beats.  .Marland KitchenBREAST BIOPSY Left   . CARDIAC CATHETERIZATION N/A 11/19/2016   Procedure: Left Heart Cath and Coronary Angiography;  Surgeon: HBelva Crome MD;  [place] [facility];  Service: Cardiovascular.    LM nl, LAD 40p, 27m, D1/2 small, LCX 40ost, OM2/3 nl/small, RCA 35 ost/mid, RPDA/RPL small/nl.  . CARPAL TUNNEL RELEASE Right 11/01/2014  . COLONOSCOPY    . DILATION AND CURETTAGE OF UTERUS    . ECTOPIC PREGNANCY SURGERY  X 2  . JOINT REPLACEMENT    . KNEE ARTHROSCOPY Left 11/2014  . TONSILLECTOMY    . TOTAL KNEE ARTHROPLASTY Right 02/18/2015   Procedure: RIGHT TOTAL KNEE ARTHROPLASTY;  Surgeon: SNetta Cedars MD;  [place]  Service: Orthopedics;  Laterality: Right;  . TOTAL KNEE ARTHROPLASTY Left 08/19/2015   Procedure: LEFT TOTAL KNEE ARTHROPLASTY;  Surgeon: SNetta Cedars MD;  [place] [facility]  Service: Orthopedics;  Laterality: Left;  . TRANSTHORACIC ECHOCARDIOGRAM  11/2016   :normal LV size, thickness and function. EF 55-60%. GR 1 DD.No RWMA severe mitral annular calcification, but no mitral stenosis. Mild LA dilation  . VAGINAL HYSTERECTOMY      Social History  Substance Use Topics  . Smoking status: Former Smoker    Packs/day: 0.25    Years: 44.00    Types: Cigarettes    Quit date: 02/17/2015  . Smokeless tobacco: Never Used  . Alcohol use 0.0 oz/week     Comment: 07/03/2017 "might have a few drinks/year"    Family History   Problem Relation Age of Onset  . Cancer Mother   . Diabetes Mother   . Hypertension Mother   . Hyperlipidemia Mother   . Cancer Father   . Hyperlipidemia Father   . Mental illness Sister   . Diabetes Sister   . Diabetes Maternal Grandmother   . Diabetes Maternal Grandfather   . Colon cancer Neg Hx   . Esophageal cancer Neg Hx   . Stomach cancer Neg Hx   . Rectal cancer Neg Hx     Allergies  Allergen Reactions  . Penicillins Hives    Has patient had a PCN reaction causing immediate rash, facial/tongue/throat swelling, SOB or lightheadedness with hypotension: Yes Has patient had a PCN reaction causing severe rash involving mucus membranes or skin necrosis: No Has patient had a PCN reaction that required hospitalization No Has patient had a PCN reaction occurring within the last 10 years: No If all of the above answers are "NO", then may proceed with Cephalosporin use.  . Metformin And Related     Diarrhea, bleeding    Medication list has been reviewed and updated.  Current Outpatient Prescriptions on File Prior to Visit  Medication Sig Dispense Refill  . aspirin 81 MG chewable tablet Chew 1 tablet (81 mg total) by mouth daily. 30 tablet   . atorvastatin (LIPITOR) 80 MG tablet Take 1 tablet (80 mg total) by mouth daily at 6 PM. 90 tablet 3  . blood glucose meter kit and supplies KIT Use up to four times daily as directed.Dx: E11.9 1 each 0  . Blood Glucose Monitoring Suppl (ACCU-CHEK AVIVA PLUS) W/DEVICE KIT     . cetirizine (ZYRTEC) 10 MG tablet Take 10 mg by mouth daily as needed for allergies.    . folic acid (FOLVITE) 1 MG tablet Take 2 tablets (2 mg total) by mouth daily. (Patient taking differently: Take 1 mg by mouth 2 (two) times daily. ) 180 tablet 4  . glipiZIDE (GLUCOTROL) 5 MG tablet Take 1 tablet (5 mg total) by mouth 2 (two) times daily before a meal. 180 tablet 3  . glucose blood test strip Use up to four times  daily as directly. Dx E11.9 400 each 3  . Insulin  Glargine (LANTUS) 100 UNIT/ML Solostar Pen Inject 25 Units into the skin daily. 15 mL 11  . Insulin Pen Needle (PEN NEEDLES) 31G X 5 MM MISC 1 Units by Does not apply route daily. For use with victoza pen 100 each 3  . liraglutide (VICTOZA) 18 MG/3ML SOPN START WITH 0.6 MG INJECTED [place] DAILY FOR ONE WEEK, THEN INCREASE TO 1.2 MG DAILY 6 mL 3  . losartan (COZAAR) 50 MG tablet Take 1 tablet (50 mg total) by mouth daily. 60 tablet 11  . metoprolol tartrate (LOPRESSOR) 25 MG tablet Take 1 tablet (25 mg total) by mouth daily as needed. If break thorough fast heart beat occur 30 tablet 6  . metoprolol tartrate (LOPRESSOR) 50 MG tablet Take 1 tablet (50 mg total) by mouth 2 (two) times daily. 180 tablet 3  . Multiple Vitamin (MULTIVITAMIN WITH MINERALS) TABS tablet Take 1 tablet by mouth daily. Reported on 01/03/2016    . nitroGLYCERIN (NITROSTAT) 0.4 MG SL tablet PLACE 1 TABLET UNDER THE TONGUE EVERY 5MIN AS NEEDED FOR 3 DOSES FOR CHEST PAIN 25 tablet 3  . PRESCRIPTION MEDICATION Inhale into the lungs at bedtime. CPAP     No current facility-administered medications on file prior to visit.     Review of Systems:  As per HPI- otherwise negative.   Physical Examination: Vitals:   07/10/17 1156  BP: 113/76  Pulse: 71  Temp: 97.9 F (36.6 C)  SpO2: 95%   Vitals:   07/10/17 1156  Weight: 243 lb 6.4 oz (110.4 kg)  Height: 5' 3" (1.6 m)   Body mass index is 43.12 kg/m. Ideal Body Weight: Weight in (lb) to have BMI = 25: 140.8  GEN: WDWN, NAD, Non-toxic, A & O x 3, obese, otherwise looks well HEENT: Atraumatic, Normocephalic. Neck supple. No masses, No LAD. Ears and Nose: No external deformity. CV: RRR, No M/G/R. No JVD. No thrill. No extra heart sounds. PULM: CTA B, no wheezes, crackles, rhonchi. No retractions. No resp. distress. No accessory muscle use. ABD: S, NT, ND, +BS. No rebound. No HSM. EXTR: No c/c/e NEURO Normal gait.  PSYCH: Normally interactive. Conversant. Not depressed or  anxious appearing.  Calm demeanor.    Assessment and Plan: Essential hypertension  Class 3 obesity with serious comorbidity in adult, unspecified BMI, unspecified obesity type - Plan: Basic metabolic panel  Uncontrolled type 2 diabetes mellitus without complication, without long-term current use of insulin ([place]) - Plan: Basic metabolic panel  Hospital discharge follow-up  Recheck visit today following syncope, thought due to dehydration worsened by hyperglycemia.  She is feeling much better and is getting her glucose under control with her current regimen of lantus, victoza and glipizide She is determined to lose weight BP is ok today Encouraged her to get her annual eye exam She will update me via mychart in 1-2 weeks, and we plan to visit in 3 months for a repeat A1c  Signed Lamar Blinks, MD  Results for orders placed or performed in visit on 70/14/10  Basic metabolic panel  Result Value Ref Range   Sodium 137 135 - 145 mEq/L   Potassium 3.9 3.5 - 5.1 mEq/L   Chloride 102 96 - 112 mEq/L   CO2 27 19 - 32 mEq/L   Glucose, Bld 256 (H) 70 - 99 mg/dL   BUN 18 6 - 23 mg/dL   Creatinine, Ser 1.10 0.40 - 1.20 mg/dL   Calcium  9.8 8.4 - 10.5 mg/dL   GFR 64.42 >60.00 mL/min

## 2017-07-16 ENCOUNTER — Encounter: Payer: Self-pay | Admitting: Family Medicine

## 2017-07-23 ENCOUNTER — Other Ambulatory Visit: Payer: Self-pay | Admitting: Family Medicine

## 2017-07-27 ENCOUNTER — Other Ambulatory Visit: Payer: Self-pay | Admitting: Family Medicine

## 2017-07-27 ENCOUNTER — Encounter: Payer: Self-pay | Admitting: Family Medicine

## 2017-07-27 ENCOUNTER — Other Ambulatory Visit: Payer: Self-pay | Admitting: Cardiology

## 2017-07-27 DIAGNOSIS — E1169 Type 2 diabetes mellitus with other specified complication: Secondary | ICD-10-CM

## 2017-07-27 DIAGNOSIS — E669 Obesity, unspecified: Principal | ICD-10-CM

## 2017-07-29 MED ORDER — ACCU-CHEK AVIVA PLUS W/DEVICE KIT: PACK | 0 refills | Status: DC

## 2017-07-29 MED ORDER — LIRAGLUTIDE 18 MG/3ML ~~LOC~~ SOPN: PEN_INJECTOR | SUBCUTANEOUS | 3 refills | Status: DC

## 2017-07-29 MED ORDER — GLIPIZIDE 5 MG PO TABS: 5.0000 mg | ORAL_TABLET | Freq: Two times a day (BID) | ORAL | 3 refills | Status: DC

## 2017-07-29 NOTE — Telephone Encounter (Signed)
Rx(s) sent to pharmacy electronically.  

## 2017-08-09 ENCOUNTER — Encounter: Payer: Self-pay | Admitting: Cardiology

## 2017-08-11 ENCOUNTER — Other Ambulatory Visit: Payer: Self-pay | Admitting: Cardiology

## 2017-08-12 ENCOUNTER — Inpatient Hospital Stay (HOSPITAL_COMMUNITY)
Admission: EM | Admit: 2017-08-12 | Discharge: 2017-08-14 | DRG: 291 | Disposition: A | Discharge status: Home / Self Care | Payer: Medicare Other | Attending: Internal Medicine | Admitting: Internal Medicine

## 2017-08-12 ENCOUNTER — Emergency Department (HOSPITAL_COMMUNITY): Payer: Medicare Other

## 2017-08-12 ENCOUNTER — Encounter: Payer: Self-pay | Admitting: Cardiology

## 2017-08-12 ENCOUNTER — Telehealth: Payer: Self-pay

## 2017-08-12 ENCOUNTER — Encounter (HOSPITAL_COMMUNITY): Payer: Self-pay

## 2017-08-12 DIAGNOSIS — T502X5A Adverse effect of carbonic-anhydrase inhibitors, benzothiadiazides and other diuretics, initial encounter: Secondary | ICD-10-CM | POA: Diagnosis not present

## 2017-08-12 DIAGNOSIS — E1165 Type 2 diabetes mellitus with hyperglycemia: Secondary | ICD-10-CM | POA: Diagnosis not present

## 2017-08-12 DIAGNOSIS — Z818 Family history of other mental and behavioral disorders: Secondary | ICD-10-CM

## 2017-08-12 DIAGNOSIS — I110 Hypertensive heart disease with heart failure: Principal | ICD-10-CM | POA: Diagnosis present

## 2017-08-12 DIAGNOSIS — I2510 Atherosclerotic heart disease of native coronary artery without angina pectoris: Secondary | ICD-10-CM | POA: Diagnosis present

## 2017-08-12 DIAGNOSIS — J9620 Acute and chronic respiratory failure, unspecified whether with hypoxia or hypercapnia: Secondary | ICD-10-CM

## 2017-08-12 DIAGNOSIS — Z90710 Acquired absence of both cervix and uterus: Secondary | ICD-10-CM

## 2017-08-12 DIAGNOSIS — M170 Bilateral primary osteoarthritis of knee: Secondary | ICD-10-CM | POA: Diagnosis present

## 2017-08-12 DIAGNOSIS — M19021 Primary osteoarthritis, right elbow: Secondary | ICD-10-CM | POA: Diagnosis present

## 2017-08-12 DIAGNOSIS — R569 Unspecified convulsions: Secondary | ICD-10-CM | POA: Diagnosis present

## 2017-08-12 DIAGNOSIS — Z794 Long term (current) use of insulin: Secondary | ICD-10-CM | POA: Diagnosis not present

## 2017-08-12 DIAGNOSIS — J42 Unspecified chronic bronchitis: Secondary | ICD-10-CM

## 2017-08-12 DIAGNOSIS — Z9989 Dependence on other enabling machines and devices: Secondary | ICD-10-CM | POA: Diagnosis not present

## 2017-08-12 DIAGNOSIS — Z6841 Body Mass Index (BMI) 40.0 and over, adult: Secondary | ICD-10-CM

## 2017-08-12 DIAGNOSIS — J9601 Acute respiratory failure with hypoxia: Secondary | ICD-10-CM | POA: Diagnosis present

## 2017-08-12 DIAGNOSIS — Z8349 Family history of other endocrine, nutritional and metabolic diseases: Secondary | ICD-10-CM

## 2017-08-12 DIAGNOSIS — Z880 Allergy status to penicillin: Secondary | ICD-10-CM

## 2017-08-12 DIAGNOSIS — Z833 Family history of diabetes mellitus: Secondary | ICD-10-CM

## 2017-08-12 DIAGNOSIS — Z888 Allergy status to other drugs, medicaments and biological substances status: Secondary | ICD-10-CM

## 2017-08-12 DIAGNOSIS — I5032 Chronic diastolic (congestive) heart failure: Secondary | ICD-10-CM | POA: Diagnosis present

## 2017-08-12 DIAGNOSIS — J9611 Chronic respiratory failure with hypoxia: Secondary | ICD-10-CM | POA: Diagnosis present

## 2017-08-12 DIAGNOSIS — M19022 Primary osteoarthritis, left elbow: Secondary | ICD-10-CM | POA: Diagnosis present

## 2017-08-12 DIAGNOSIS — M19032 Primary osteoarthritis, left wrist: Secondary | ICD-10-CM | POA: Diagnosis present

## 2017-08-12 DIAGNOSIS — I10 Essential (primary) hypertension: Secondary | ICD-10-CM | POA: Diagnosis present

## 2017-08-12 DIAGNOSIS — E876 Hypokalemia: Secondary | ICD-10-CM | POA: Diagnosis not present

## 2017-08-12 DIAGNOSIS — I358 Other nonrheumatic aortic valve disorders: Secondary | ICD-10-CM | POA: Diagnosis present

## 2017-08-12 DIAGNOSIS — J9621 Acute and chronic respiratory failure with hypoxia: Secondary | ICD-10-CM | POA: Diagnosis present

## 2017-08-12 DIAGNOSIS — Z87891 Personal history of nicotine dependence: Secondary | ICD-10-CM

## 2017-08-12 DIAGNOSIS — G4733 Obstructive sleep apnea (adult) (pediatric): Secondary | ICD-10-CM

## 2017-08-12 DIAGNOSIS — J440 Chronic obstructive pulmonary disease with acute lower respiratory infection: Secondary | ICD-10-CM | POA: Diagnosis present

## 2017-08-12 DIAGNOSIS — E785 Hyperlipidemia, unspecified: Secondary | ICD-10-CM | POA: Diagnosis present

## 2017-08-12 DIAGNOSIS — Z96653 Presence of artificial knee joint, bilateral: Secondary | ICD-10-CM | POA: Diagnosis present

## 2017-08-12 DIAGNOSIS — Z809 Family history of malignant neoplasm, unspecified: Secondary | ICD-10-CM

## 2017-08-12 DIAGNOSIS — E119 Type 2 diabetes mellitus without complications: Secondary | ICD-10-CM

## 2017-08-12 DIAGNOSIS — I5033 Acute on chronic diastolic (congestive) heart failure: Secondary | ICD-10-CM | POA: Diagnosis present

## 2017-08-12 DIAGNOSIS — I5030 Unspecified diastolic (congestive) heart failure: Secondary | ICD-10-CM | POA: Diagnosis present

## 2017-08-12 DIAGNOSIS — J209 Acute bronchitis, unspecified: Secondary | ICD-10-CM | POA: Diagnosis present

## 2017-08-12 DIAGNOSIS — Z8249 Family history of ischemic heart disease and other diseases of the circulatory system: Secondary | ICD-10-CM

## 2017-08-12 DIAGNOSIS — I252 Old myocardial infarction: Secondary | ICD-10-CM

## 2017-08-12 DIAGNOSIS — D563 Thalassemia minor: Secondary | ICD-10-CM | POA: Diagnosis present

## 2017-08-12 DIAGNOSIS — M479 Spondylosis, unspecified: Secondary | ICD-10-CM | POA: Diagnosis present

## 2017-08-12 DIAGNOSIS — I050 Rheumatic mitral stenosis: Secondary | ICD-10-CM | POA: Diagnosis present

## 2017-08-12 DIAGNOSIS — J441 Chronic obstructive pulmonary disease with (acute) exacerbation: Secondary | ICD-10-CM | POA: Diagnosis present

## 2017-08-12 DIAGNOSIS — Z7982 Long term (current) use of aspirin: Secondary | ICD-10-CM

## 2017-08-12 DIAGNOSIS — M19031 Primary osteoarthritis, right wrist: Secondary | ICD-10-CM | POA: Diagnosis present

## 2017-08-12 HISTORY — DX: Syncope and collapse: R55

## 2017-08-12 LAB — BASIC METABOLIC PANEL
Anion gap: 7 (ref 5–15)
BUN: 14 mg/dL (ref 6–20)
CO2: 28 mmol/L (ref 22–32)
Calcium: 8.9 mg/dL (ref 8.9–10.3)
Chloride: 105 mmol/L (ref 101–111)
Creatinine, Ser: 0.92 mg/dL (ref 0.44–1.00)
GFR calc Af Amer: >60 mL/min (ref >60)
GFR calc non Af Amer: >60 mL/min (ref >60)
Glucose, Bld: 211 mg/dL — ABNORMAL HIGH (ref 65–99)
Potassium: 3.7 mmol/L (ref 3.5–5.1)
Sodium: 140 mmol/L (ref 135–145)

## 2017-08-12 LAB — CBC
HCT: 27.9 % — ABNORMAL LOW (ref 36.0–46.0)
Hemoglobin: 8.3 g/dL — ABNORMAL LOW (ref 12.0–15.0)
MCH: 18.4 pg — ABNORMAL LOW (ref 26.0–34.0)
MCHC: 29.7 g/dL — ABNORMAL LOW (ref 30.0–36.0)
MCV: 61.7 fL — ABNORMAL LOW (ref 78.0–100.0)
Platelets: 281 10*3/uL (ref 150–400)
RBC: 4.52 MIL/uL (ref 3.87–5.11)
RDW: 17.1 % — ABNORMAL HIGH (ref 11.5–15.5)
WBC: 9.7 10*3/uL (ref 4.0–10.5)

## 2017-08-12 LAB — BRAIN NATRIURETIC PEPTIDE: B Natriuretic Peptide: 83 pg/mL (ref 0.0–100.0)

## 2017-08-12 LAB — POCT I-STAT TROPONIN I: Troponin i, poc: 0 ng/mL (ref 0.00–0.08)

## 2017-08-12 LAB — I-STAT TROPONIN, ED: Troponin i, poc: 0 ng/mL (ref 0.00–0.08)

## 2017-08-12 LAB — GLUCOSE, CAPILLARY: Glucose-Capillary: 149 mg/dL — ABNORMAL HIGH (ref 65–99)

## 2017-08-12 MED ORDER — SODIUM CHLORIDE 0.9% FLUSH
3.0000 mL | Freq: Two times a day (BID) | INTRAVENOUS | Status: DC
  Administered 2017-08-12 – 2017-08-14 (×4): 3 mL via INTRAVENOUS

## 2017-08-12 MED ORDER — POTASSIUM CHLORIDE CRYS ER 20 MEQ PO TBCR: 40.0000 meq | EXTENDED_RELEASE_TABLET | Freq: Every day | ORAL | 0 refills | Status: DC

## 2017-08-12 MED ORDER — ATORVASTATIN CALCIUM 80 MG PO TABS
80.0000 mg | ORAL_TABLET | Freq: Every day | ORAL | Status: DC
  Administered 2017-08-12 – 2017-08-13 (×2): 80 mg via ORAL
  Filled 2017-08-12: qty 2
  Filled 2017-08-12 (×3): qty 1
  Filled 2017-08-12: qty 2

## 2017-08-12 MED ORDER — INSULIN ASPART 100 UNIT/ML ~~LOC~~ SOLN
0.0000 [IU] | Freq: Three times a day (TID) | SUBCUTANEOUS | Status: DC
  Administered 2017-08-13 (×2): 8 [IU] via SUBCUTANEOUS
  Administered 2017-08-13 – 2017-08-14 (×2): 11 [IU] via SUBCUTANEOUS

## 2017-08-12 MED ORDER — ACETAMINOPHEN 325 MG PO TABS
650.0000 mg | ORAL_TABLET | ORAL | Status: DC | PRN
  Administered 2017-08-13 – 2017-08-14 (×2): 650 mg via ORAL
  Filled 2017-08-12 (×2): qty 2

## 2017-08-12 MED ORDER — ALBUTEROL SULFATE (2.5 MG/3ML) 0.083% IN NEBU: 2.5000 mg | INHALATION_SOLUTION | RESPIRATORY_TRACT | Status: DC | PRN

## 2017-08-12 MED ORDER — ACETAMINOPHEN 500 MG PO TABS
1000.0000 mg | ORAL_TABLET | Freq: Once | ORAL | Status: AC
  Administered 2017-08-12: 1000 mg via ORAL
  Filled 2017-08-12: qty 2

## 2017-08-12 MED ORDER — FOLIC ACID 1 MG PO TABS
1.0000 mg | ORAL_TABLET | Freq: Two times a day (BID) | ORAL | Status: DC
  Administered 2017-08-12 – 2017-08-14 (×4): 1 mg via ORAL
  Filled 2017-08-12 (×4): qty 1

## 2017-08-12 MED ORDER — ENOXAPARIN SODIUM 40 MG/0.4ML ~~LOC~~ SOLN
40.0000 mg | Freq: Every day | SUBCUTANEOUS | Status: DC
  Administered 2017-08-12 – 2017-08-13 (×2): 40 mg via SUBCUTANEOUS
  Filled 2017-08-12 (×2): qty 0.4

## 2017-08-12 MED ORDER — FUROSEMIDE 20 MG PO TABS: 20.0000 mg | ORAL_TABLET | Freq: Every day | ORAL | 0 refills | Status: DC

## 2017-08-12 MED ORDER — IPRATROPIUM-ALBUTEROL 0.5-2.5 (3) MG/3ML IN SOLN
RESPIRATORY_TRACT | Status: AC
  Filled 2017-08-12: qty 3

## 2017-08-12 MED ORDER — SODIUM CHLORIDE 0.9% FLUSH
3.0000 mL | INTRAVENOUS | Status: DC | PRN
  Administered 2017-08-13: 3 mL via INTRAVENOUS
  Filled 2017-08-12: qty 3

## 2017-08-12 MED ORDER — ASPIRIN 81 MG PO CHEW
81.0000 mg | CHEWABLE_TABLET | Freq: Every day | ORAL | Status: DC
  Administered 2017-08-13 – 2017-08-14 (×2): 81 mg via ORAL
  Filled 2017-08-12 (×2): qty 1

## 2017-08-12 MED ORDER — LOSARTAN POTASSIUM 50 MG PO TABS
50.0000 mg | ORAL_TABLET | Freq: Every day | ORAL | Status: DC
  Administered 2017-08-13 – 2017-08-14 (×2): 50 mg via ORAL
  Filled 2017-08-12 (×2): qty 1

## 2017-08-12 MED ORDER — FUROSEMIDE 40 MG PO TABS
20.0000 mg | ORAL_TABLET | Freq: Every day | ORAL | Status: DC
  Administered 2017-08-13: 20 mg via ORAL
  Filled 2017-08-12: qty 1

## 2017-08-12 MED ORDER — POTASSIUM CHLORIDE CRYS ER 20 MEQ PO TBCR
40.0000 meq | EXTENDED_RELEASE_TABLET | Freq: Once | ORAL | Status: AC
  Administered 2017-08-12: 40 meq via ORAL
  Filled 2017-08-12: qty 2

## 2017-08-12 MED ORDER — INSULIN ASPART 100 UNIT/ML ~~LOC~~ SOLN: 0.0000 [IU] | Freq: Every day | SUBCUTANEOUS | Status: DC

## 2017-08-12 MED ORDER — FUROSEMIDE 10 MG/ML IJ SOLN
40.0000 mg | Freq: Once | INTRAMUSCULAR | Status: AC
  Administered 2017-08-12: 40 mg via INTRAVENOUS
  Filled 2017-08-12: qty 4

## 2017-08-12 MED ORDER — PREDNISONE 20 MG PO TABS
20.0000 mg | ORAL_TABLET | Freq: Every day | ORAL | Status: DC
  Administered 2017-08-12 – 2017-08-13 (×2): 20 mg via ORAL
  Filled 2017-08-12 (×2): qty 1

## 2017-08-12 MED ORDER — IPRATROPIUM-ALBUTEROL 0.5-2.5 (3) MG/3ML IN SOLN
3.0000 mL | Freq: Four times a day (QID) | RESPIRATORY_TRACT | Status: DC
  Administered 2017-08-12 – 2017-08-13 (×5): 3 mL via RESPIRATORY_TRACT
  Filled 2017-08-12 (×4): qty 3

## 2017-08-12 MED ORDER — ONDANSETRON HCL 4 MG/2ML IJ SOLN: 4.0000 mg | Freq: Four times a day (QID) | INTRAMUSCULAR | Status: DC | PRN

## 2017-08-12 MED ORDER — METOPROLOL TARTRATE 25 MG PO TABS
25.0000 mg | ORAL_TABLET | Freq: Two times a day (BID) | ORAL | Status: DC
  Administered 2017-08-12 – 2017-08-14 (×4): 25 mg via ORAL
  Filled 2017-08-12 (×4): qty 1

## 2017-08-12 MED ORDER — INSULIN GLARGINE 100 UNIT/ML ~~LOC~~ SOLN
25.0000 [IU] | Freq: Every day | SUBCUTANEOUS | Status: DC
  Administered 2017-08-13: 25 [IU] via SUBCUTANEOUS
  Filled 2017-08-12: qty 0.25

## 2017-08-12 MED ORDER — SODIUM CHLORIDE 0.9 % IV SOLN: 250.0000 mL | INTRAVENOUS | Status: DC | PRN

## 2017-08-12 NOTE — ED Notes (Signed)
Call report to [place], [place] at [phone]

## 2017-08-12 NOTE — ED Provider Notes (Signed)
Patient seen earlier by Dr. Ellender Hose with plan to discharge after receiving Lasix.  Patient has diuresed approximately 1 L here and continues to have dyspnea on exertion.  Denies any chest pain.  We will consult hospitalist for admission   Lacretia Leigh, MD 08/12/17 1820

## 2017-08-12 NOTE — ED Provider Notes (Signed)
[facility] DEPT Provider Note   CSN: [id number] Arrival date & time: 08/12/17  1034     History   Chief Complaint Chief Complaint  Patient presents with  . Shortness of Breath  . Cough    HPI Lynn [name] is a 61 y.o. female.  HPI   61 yo F with PMHx as below here with SOB. Pt was just hospitalized in September for syncope 2/2 dehydration. Her HCTZ was held at that time. Since then, she reports she has had progressively worsening b/l leg swelling. Over the past week, she has noticed increasing SOB with exertion, as well as extreme orthopnea which feels like she is being "suffocated with a plastic bag" when she lies flat. She's had associated occasional wheezing and non-productive cough, and bilateral leg edema. She has not had any CP, nausea, or diaphoresis. She follows with Dr. Ellyn Hack and has known HFpEF. Last cath showed moderate CAD. No other med changes. No sputum production or fevers. She feels well when sitting upright, at rest, but all her sx worsen with exertion.  Past Medical History:  Diagnosis Date  . Anginal pain ([place])   . Anxiety   . Arthritis    "knees, wrists, back, elbows" (07/03/2017)  . Clotting disorder ([place])    blood clot in eye 2016  . Coronary artery disease, non-occlusive    a. 11/2016 NSTEMI/Cath: LM nl, LAD 40p, 84m, D1/2 small, LCX 40ost, OM2/3 nl/small, RCA 35 ost/mid, RPDA/RPL small/nl.  . Depression   . Diastolic dysfunction    a. 11/2016 Echo: EF 55-60%, gr1 DD, sev calcified MV annulus, mildly dil LA.  .Marland KitchenEye hemorrhage 01/2015   "right; resolved" (07/03/2017)  . Headache    "monthly" (07/03/2017)  . Heart murmur   . History of blood transfusion    "when I had an ectopic pregnancy"  . History of stomach ulcers   . Hyperlipidemia   . Hypertension   . Microcytic anemia   . OSA on CPAP   . Pneumonia    "several times" (07/03/2017)  . Seizures ([facility] 07/03/2017 X 2  . Thalassemia    "my cells are sickle cell  shaped but I don't have sickle cell anemia" (07/03/2017)  . Type II diabetes mellitus ([facility]     Patient Active Problem List   Diagnosis Date Noted  . Near syncope 07/03/2017  . Anxiety 07/03/2017  . Uncontrolled diabetes mellitus ([facility] 07/03/2017  . Coronary artery disease, non-occlusive 12/08/2016  . PAT (paroxysmal atrial tachycardia) ([facility]   . Non-STEMI (non-ST elevated myocardial infarction) ([facility] 11/16/2016  . H/O total knee replacement 08/19/2015  . Vision, loss, sudden 03/07/2015  . S/P TKR (total knee replacement) using cement 02/18/2015  . Thalassemia 09/02/2013  . Hyperlipidemia with target low density lipoprotein (LDL) cholesterol less than 100 mg/dL 08/24/2012  . Diabetes mellitus ([facility] 07/22/2012  . Essential hypertension 07/22/2012    Past Surgical History:  Procedure Laterality Date  . 48-Hour Monitor  03/18/2017   Sinus rhythm with sinus tachycardia (rate 58-134 BPM)multiple PVCs noted with couplets and bigeminy. One triplet. 7 runs of PAT ranging from 100-130 bpm. Longest was 33 beats.  .Marland KitchenBREAST BIOPSY Left   . CARDIAC CATHETERIZATION N/A 11/19/2016   Procedure: Left Heart Cath and Coronary Angiography;  Surgeon: HBelva Crome MD;  [facility];  Service: Cardiovascular.    LM nl, LAD 40p, 532m D1/2 small, LCX 40ost, OM2/3 nl/small, RCA 35 ost/mid, RPDA/RPL small/nl.  . CARPAL TUNNEL RELEASE Right  11/01/2014  . COLONOSCOPY    . DILATION AND CURETTAGE OF UTERUS    . ECTOPIC PREGNANCY SURGERY  X 2  . JOINT REPLACEMENT    . KNEE ARTHROSCOPY Left 11/2014  . TONSILLECTOMY    . TOTAL KNEE ARTHROPLASTY Right 02/18/2015   Procedure: RIGHT TOTAL KNEE ARTHROPLASTY;  Surgeon: Netta Cedars, MD;  Location: [facility];  Service: Orthopedics;  Laterality: Right;  . TOTAL KNEE ARTHROPLASTY Left 08/19/2015   Procedure: LEFT TOTAL KNEE ARTHROPLASTY;  Surgeon: Netta Cedars, MD;  Location: [facility];  Service: Orthopedics;  Laterality: Left;  . TRANSTHORACIC ECHOCARDIOGRAM   11/2016   :normal LV size, thickness and function. EF 55-60%. GR 1 DD.No RWMA severe mitral annular calcification, but no mitral stenosis. Mild LA dilation  . VAGINAL HYSTERECTOMY      OB History    No data available       Home Medications    Prior to Admission medications   Medication Sig Start Date End Date Taking? Authorizing Provider  aspirin 81 MG chewable tablet Chew 1 tablet (81 mg total) by mouth daily. 11/21/16   Cheryln Manly, NP  atorvastatin (LIPITOR) 80 MG tablet Take 1 tablet (80 mg total) by mouth daily at 6 PM. 06/12/17   Copland, Gay Filler, MD  B-D UF III MINI PEN NEEDLES 31G X 5 MM MISC USE DAILY WITH VICTOZA 07/23/17   Copland, Gay Filler, MD  blood glucose meter kit and supplies KIT Use up to four times daily as directed.Dx: E11.9 06/20/16   Copland, Gay Filler, MD  Blood Glucose Monitoring Suppl (ACCU-CHEK AVIVA PLUS) w/Device KIT Dispense 1 glucose testing kit . Use twice daily 07/29/17   Copland, Gay Filler, MD  cetirizine (ZYRTEC) 10 MG tablet Take 10 mg by mouth daily as needed for allergies.    [provider]  CONTOUR NEXT TEST test strip USE UP TO FOUR TIMES DAILY AS DIRECTLY. DX E11.9 07/29/17   Copland, Gay Filler, MD  folic acid (FOLVITE) 1 MG tablet Take 2 tablets (2 mg total) by mouth daily. Patient taking differently: Take 1 mg by mouth 2 (two) times daily.  02/26/17   Volanda Napoleon, MD  glipiZIDE (GLUCOTROL) 5 MG tablet Take 1 tablet (5 mg total) by mouth 2 (two) times daily before a meal. 07/29/17   Copland, Gay Filler, MD  Insulin Glargine (LANTUS) 100 UNIT/ML Solostar Pen Inject 25 Units into the skin daily. 07/05/17   Debbe Odea, MD  liraglutide (VICTOZA) 18 MG/3ML SOPN START WITH 0.6 MG INJECTED [place] DAILY FOR ONE WEEK, THEN INCREASE TO 1.2 MG DAILY 07/29/17   Copland, Gay Filler, MD  losartan (COZAAR) 50 MG tablet Take 1 tablet (50 mg total) by mouth daily. 07/05/17 07/05/18  Debbe Odea, MD  metoprolol tartrate (LOPRESSOR) 25 MG tablet Take 1  tablet (25 mg total) by mouth daily as needed. If break thorough fast heart beat occur 06/07/17   Leonie Man, MD  metoprolol tartrate (LOPRESSOR) 25 MG tablet TAKE 1 TABLET (25 MG TOTAL) BY MOUTH 2 (TWO) TIMES DAILY. 08/12/17   Leonie Man, MD  Multiple Vitamin (MULTIVITAMIN WITH MINERALS) TABS tablet Take 1 tablet by mouth daily. Reported on 01/03/2016    [provider]  nitroGLYCERIN (NITROSTAT) 0.4 MG SL tablet PLACE 1 TABLET UNDER THE TONGUE EVERY 5MIN AS NEEDED FOR 3 DOSES FOR CHEST PAIN 07/29/17   Leonie Man, MD  PRESCRIPTION MEDICATION Inhale into the lungs at bedtime. CPAP    [provider]    Family History Family History  Problem Relation Age of Onset  . Cancer Mother   . Diabetes Mother   . Hypertension Mother   . Hyperlipidemia Mother   . Cancer Father   . Hyperlipidemia Father   . Mental illness Sister   . Diabetes Sister   . Diabetes Maternal Grandmother   . Diabetes Maternal Grandfather   . Colon cancer Neg Hx   . Esophageal cancer Neg Hx   . Stomach cancer Neg Hx   . Rectal cancer Neg Hx     Social History Social History  Substance Use Topics  . Smoking status: Former Smoker    Packs/day: 0.25    Years: 44.00    Types: Cigarettes    Quit date: 02/17/2015  . Smokeless tobacco: Never Used  . Alcohol use 0.0 oz/week     Comment: 07/03/2017 "might have a few drinks/year"     Allergies   Penicillins and Metformin and related   Review of Systems Review of Systems  Constitutional: Positive for fatigue.  Respiratory: Positive for cough and shortness of breath.   Neurological: Positive for weakness.  All other systems reviewed and are negative.    Physical Exam Updated Vital Signs BP (!) 170/88 (BP Location: Left Arm)   Pulse 69   Temp 98.1 F (36.7 C) (Oral)   Resp (!) 22   SpO2 98%   Physical Exam  Constitutional: She is oriented to person, place, and time. She appears well-developed and well-nourished. No  distress.  HENT:  Head: Normocephalic and atraumatic.  Eyes: Conjunctivae are normal.  Neck: Neck supple. JVD present.  Cardiovascular: Normal rate, regular rhythm and normal heart sounds.  Exam reveals no friction rub.   No murmur heard. Pulmonary/Chest: Effort normal. No respiratory distress. She has no wheezes. She has rales (mild, bibasilar).  Abdominal: Soft. She exhibits no distension.  Musculoskeletal: She exhibits edema (2+ pitting bilateral LE).  Neurological: She is alert and oriented to person, place, and time. She exhibits normal muscle tone.  Skin: Skin is warm. Capillary refill takes less than 2 seconds.  Psychiatric: She has a normal mood and affect.  Nursing note and vitals reviewed.    ED Treatments / Results  Labs (all labs ordered are listed, but only abnormal results are displayed) Labs Reviewed  BASIC METABOLIC PANEL - Abnormal; Notable for the following:       Result Value   Glucose, Bld 211 (*)    All other components within normal limits  CBC - Abnormal; Notable for the following:    Hemoglobin 8.3 (*)    HCT 27.9 (*)    MCV 61.7 (*)    MCH 18.4 (*)    MCHC 29.7 (*)    RDW 17.1 (*)    All other components within normal limits  BRAIN NATRIURETIC PEPTIDE  I-STAT TROPONIN, ED  POCT I-STAT TROPONIN I    EKG  EKG Interpretation None       Radiology Dg Chest 2 View  Result Date: 08/12/2017 CLINICAL DATA:  Shortness of breath and productive cough. EXAM: CHEST  2 VIEW COMPARISON:  Chest x-ray dated July 03, 2017. FINDINGS: The cardiomediastinal silhouette remains mildly enlarged. Increased pulmonary vascular congestion and interstitial markings. Diffuse peribronchial thickening. Trace left pleural effusion with adjacent left lower lobe opacities. Right basilar atelectasis. No pneumothorax. No acute osseous abnormality. IMPRESSION: 1. Mild cardiomegaly with peribronchial thickening and lower lobe predominant increased interstitial markings, likely  representing interstitial edema versus airways inflammation.  2. Trace left pleural effusion. 3. Bibasilar opacities, favor atelectasis over pneumonia. Electronically Signed   By: Titus Dubin M.D.   On: 08/12/2017 11:48    Procedures Procedures (including critical care time)   Emergency Ultrasound Study:   Angiocath insertion Performed by: Evonnie Pat Consent: Verbal consent/emergent consent obtained. Risks and benefits: risks, benefits and alternatives were discussed Immediately prior to procedure the correct patient, procedure, equipment, support staff and site/side marked as needed.  Indication: difficult IV access Preparation: Patient was prepped and draped in the usual sterile fashion. Sterile gel was used for this procedure and the ultrasound probe was sterilized prior to use. Vein Location: Left forearm vein was visualized during assessment for potential access sites and was found to be patent/ easily compressed with linear ultrasound.  The needle was visualized with real-time ultrasound and guided into the vein. Gauge: 20  Image saved and stored.  Normal blood return.   Patient tolerance: Patient tolerated the procedure well with no immediate complications.      Medications Ordered in ED Medications - No data to display   Initial Impression / Assessment and Plan / ED Course  I have reviewed the triage vital signs and the nursing notes.  Pertinent labs & imaging results that were available during my care of the patient were reviewed by me and considered in my medical decision making (see chart for details).     61 yo F with HFpEF here with worsening b/l leg edema, SOB with exertion, and orthopnea. No CP. Trop is neg x 2. Doubt ACS. History, exam is c/w acute dCHF exacerbation. Discussed case with Dr. Percival Spanish of Cardiology. Will give IV Lasix, plan for d/c if able to ambulate w/o difficulty. She is not hypoxic, tachypneic, and is o/w well appearing clinically. She  does have significant hypervolemia but may be outpt candidate if sx improve.  Final Clinical Impressions(s) / ED Diagnoses   Final diagnoses:  Acute on chronic diastolic heart failure [facility])    New Prescriptions New Prescriptions   No medications on file     Duffy Bruce, MD 08/12/17 2052

## 2017-08-12 NOTE — Progress Notes (Signed)
RT placed patient on CPAP. Patient setting is auto 4-12 cmH2O. Sterile water added to water chamber for humidification . Patient is tolerating well.

## 2017-08-12 NOTE — ED Triage Notes (Addendum)
Pt c/o SOB and productive cough over 1 week and BLE swelling x 3 weeks.  Denies pain.  Pt reports "I feel like I'm suffocating when I lay down."  SOB increases w/ exertion.  Pt was taken off diuretic x 3 weeks ago.            Pt easily speaking full sentences.

## 2017-08-12 NOTE — Telephone Encounter (Signed)
REFILL 

## 2017-08-12 NOTE — ED Notes (Signed)
Will ambulate after lasix administration, per MD Ellender Hose. MD Issacs at bedside to obtain US IV

## 2017-08-12 NOTE — H&P (Signed)
History and Physical    Lynn [name] PYP:[id number] DOB: 12-22-1955 DOA: 08/12/2017  PCP: [name], MD Consultants:  Ellyn Hack - cardiology Patient coming from:  Home - lives with husband; NOK: Husband, [phone]  Chief Complaint: SOB  HPI: Lynn [name] is a 61 y.o. female with medical history significant of DM; syncope; OSA on CPAP; HTN; HLD; grade 1 diastolic heart failure (Echo 2/18); non-occlusive CAD; and depression presenting with SOB.  Patient gets up to walk or talks and develops SOB, wheezing, cough, "clicking" and severe SOB, gasping for air.  Symptoms have been severe for about 3 weeks but she has had ongoing issues since February.  Cardiology office has been trying to work with her on this.  She was admitted for CP obs in February and for near syncope and uncontrolled DM in September.  Dr. Ellyn Hack increased her metoprolol from 25 to 50 mg about a month ago.  Since then, her symptoms have actually worsened.  Cough is occasionally productive but she hasn't looked at the sputum to know what it looks like.  A lot of wheezing.  No chest pain.  +LE edema.  She is on CPAP at night but otherwise no home O2.  She was hospitalized from 2/2-6 for an NSTEMI.  Echo with grade 1 diastolic dysfunction.  Cath with no significant obstructive CAD.  She did have a severely calcified mitral annulus and a brief episode of SVT during cath.  She was seen in the ER again on 5/10 for SOB and CP.  This was thought to be due to increased dose of beta blocker.    She was seen by Dr. Marin Olp on 5/15 for anemia thought to be multifactorial.  He recommended waiting on cardiac rehab until her Hgb is >10.    She followed up with cardiology on 8/24.  Imdur was discontinued for concern for orthostasis.  ARB dose was cut in half and metoprolol was doubled.    She was admitted again from 9/19-21 for syncope from dehydration associated with chronically elevated sugars and HCTZ with "convulsive syncope."   Lantus was increased to 25 units daily and HCTZ was discontinued.    ED Course: HFpEF with worsening B LE edema, SOB with exertion, orthopnea.  No CP, negative troponin.  Diuresed 1L after Lasix but with ongoing DOE.  Review of Systems: As per HPI; otherwise review of systems reviewed and negative.   Ambulatory Status:   Ambulates without assistance  Past Medical History:  Diagnosis Date  . Anginal pain ([place])   . Anxiety   . Arthritis    "knees, wrists, back, elbows" (07/03/2017)  . Clotting disorder ([place])    blood clot in eye 2016  . Coronary artery disease, non-occlusive    a. 11/2016 NSTEMI/Cath: LM nl, LAD 40p, 67m, D1/2 small, LCX 40ost, OM2/3 nl/small, RCA 35 ost/mid, RPDA/RPL small/nl.  . Depression   . Diastolic dysfunction    a. 11/2016 Echo: EF 55-60%, gr1 DD, sev calcified MV annulus, mildly dil LA.  .Marland KitchenEye hemorrhage 01/2015   "right; resolved" (07/03/2017)  . Headache    "monthly" (07/03/2017)  . Heart murmur   . History of blood transfusion    "when I had an ectopic pregnancy"  . History of stomach ulcers   . Hyperlipidemia   . Hypertension   . Microcytic anemia   . OSA on CPAP    setting is unknown  . Pneumonia    "several times" (07/03/2017)  . Syncope 07/03/2017 X 2  called seizures but no medications  . Thalassemia    "my cells are sickle cell shaped but I don't have sickle cell anemia" (07/03/2017)  . Type II diabetes mellitus ([place])     Past Surgical History:  Procedure Laterality Date  . 48-Hour Monitor  03/18/2017   Sinus rhythm with sinus tachycardia (rate 58-134 BPM)multiple PVCs noted with couplets and bigeminy. One triplet. 7 runs of PAT ranging from 100-130 bpm. Longest was 33 beats.  Marland Kitchen BREAST BIOPSY Left   . CARDIAC CATHETERIZATION N/A 11/19/2016   Procedure: Left Heart Cath and Coronary Angiography;  Surgeon: Belva Crome, MD;  Location: [facility];  Service: Cardiovascular.    LM nl, LAD 40p, 3m, D1/2 small, LCX 40ost, OM2/3 nl/small,  RCA 35 ost/mid, RPDA/RPL small/nl.  . CARPAL TUNNEL RELEASE Right 11/01/2014  . COLONOSCOPY    . DILATION AND CURETTAGE OF UTERUS    . ECTOPIC PREGNANCY SURGERY  X 2  . JOINT REPLACEMENT    . KNEE ARTHROSCOPY Left 11/2014  . TONSILLECTOMY    . TOTAL KNEE ARTHROPLASTY Right 02/18/2015   Procedure: RIGHT TOTAL KNEE ARTHROPLASTY;  Surgeon: SNetta Cedars MD;  [facility]  Service: Orthopedics;  Laterality: Right;  . TOTAL KNEE ARTHROPLASTY Left 08/19/2015   Procedure: LEFT TOTAL KNEE ARTHROPLASTY;  Surgeon: SNetta Cedars MD;  [facility]  Service: Orthopedics;  Laterality: Left;  . TRANSTHORACIC ECHOCARDIOGRAM  11/2016   :normal LV size, thickness and function. EF 55-60%. GR 1 DD.No RWMA severe mitral annular calcification, but no mitral stenosis. Mild LA dilation  . VAGINAL HYSTERECTOMY      Social History   Social History  . Marital status: Married    Spouse name: N/A  . Number of children: N/A  . Years of education: N/A   Occupational History  . retired    Social History Main Topics  . Smoking status: Former Smoker    Packs/day: 0.25    Years: 44.00    Types: Cigarettes    Quit date: 02/17/2015  . Smokeless tobacco: Never Used  . Alcohol use 0.0 oz/week     Comment: 07/03/2017 "might have a few drinks/year"  . Drug use: No  . Sexual activity: No   Other Topics Concern  . Not on file   Social History Narrative  . No narrative on file    Allergies  Allergen Reactions  . Penicillins Hives    Childhood allergy Has patient had a PCN reaction causing immediate rash, facial/tongue/throat swelling, SOB or lightheadedness with hypotension: Yes Has patient had a PCN reaction causing severe rash involving mucus membranes or skin necrosis: No Has patient had a PCN reaction that required hospitalization No Has patient had a PCN reaction occurring within the last 10 years: No If all of the above answers are "NO", then may proceed with Cephalosporin use.  . Metformin And  Related     Diarrhea, bleeding    Family History  Problem Relation Age of Onset  . Cancer Mother   . Diabetes Mother   . Hypertension Mother   . Hyperlipidemia Mother   . Cancer Father   . Hyperlipidemia Father   . Mental illness Sister   . Diabetes Sister   . Diabetes Maternal Grandmother   . Diabetes Maternal Grandfather   . Colon cancer Neg Hx   . Esophageal cancer Neg Hx   . Stomach cancer Neg Hx   . Rectal cancer Neg Hx     Prior to Admission medications  Medication Sig Start Date End Date Taking? Authorizing Provider  aspirin 81 MG chewable tablet Chew 1 tablet (81 mg total) by mouth daily. 11/21/16  Yes Cheryln Manly, NP  atorvastatin (LIPITOR) 80 MG tablet Take 1 tablet (80 mg total) by mouth daily at 6 PM. 06/12/17  Yes Copland, Gay Filler, MD  cetirizine (ZYRTEC) 10 MG tablet Take 10 mg by mouth daily as needed for allergies.   Yes [provider]  folic acid (FOLVITE) 1 MG tablet Take 2 tablets (2 mg total) by mouth daily. Patient taking differently: Take 1 mg by mouth 2 (two) times daily.  02/26/17  Yes Ennever, Rudell Cobb, MD  glipiZIDE (GLUCOTROL) 5 MG tablet Take 1 tablet (5 mg total) by mouth 2 (two) times daily before a meal. 07/29/17  Yes Copland, Gay Filler, MD  Insulin Glargine (LANTUS) 100 UNIT/ML Solostar Pen Inject 25 Units into the skin daily. 07/05/17  Yes Rizwan, Eunice Blase, MD  liraglutide (VICTOZA) 18 MG/3ML SOPN START WITH 0.6 MG INJECTED  DAILY FOR ONE WEEK, THEN INCREASE TO 1.2 MG DAILY 07/29/17  Yes Copland, Gay Filler, MD  losartan (COZAAR) 50 MG tablet Take 1 tablet (50 mg total) by mouth daily. 07/05/17 07/05/18 Yes Debbe Odea, MD  metoprolol tartrate (LOPRESSOR) 25 MG tablet TAKE 1 TABLET (25 MG TOTAL) BY MOUTH 2 (TWO) TIMES DAILY. 08/12/17  Yes Leonie Man, MD  Multiple Vitamin (MULTIVITAMIN WITH MINERALS) TABS tablet Take 1 tablet by mouth daily. Reported on 01/03/2016   Yes [provider]  naproxen sodium (ALEVE) 220 MG tablet  Take 220 mg by mouth daily as needed (PAIN).   Yes [provider]  nitroGLYCERIN (NITROSTAT) 0.4 MG SL tablet PLACE 1 TABLET UNDER THE TONGUE EVERY 5MIN AS NEEDED FOR 3 DOSES FOR CHEST PAIN 07/29/17  Yes Leonie Man, MD  Oxymetazoline HCl (AFRIN 12 HOUR NA) Place 1 spray into both nostrils daily as needed (ALLERGIES/CONGESTION).   Yes [provider]  PRESCRIPTION MEDICATION Inhale into the lungs at bedtime. CPAP   Yes [provider]  B-D UF III MINI PEN NEEDLES 31G X 5 MM MISC USE DAILY WITH VICTOZA 07/23/17   Copland, Gay Filler, MD  blood glucose meter kit and supplies KIT Use up to four times daily as directed.Dx: E11.9 06/20/16   Copland, Gay Filler, MD  Blood Glucose Monitoring Suppl (ACCU-CHEK AVIVA PLUS) w/Device KIT Dispense 1 glucose testing kit . Use twice daily 07/29/17   Copland, Gay Filler, MD  CONTOUR NEXT TEST test strip USE UP TO FOUR TIMES DAILY AS DIRECTLY. DX E11.9 07/29/17   Copland, Gay Filler, MD  furosemide (LASIX) 20 MG tablet Take 1 tablet (20 mg total) by mouth daily. 08/12/17 08/19/17  Duffy Bruce, MD  metoprolol tartrate (LOPRESSOR) 25 MG tablet Take 1 tablet (25 mg total) by mouth daily as needed. If break thorough fast heart beat occur Patient not taking: Reported on 08/12/2017 06/07/17   Leonie Man, MD  potassium chloride SA (K-DUR,KLOR-CON) 20 MEQ tablet Take 2 tablets (40 mEq total) by mouth daily. 08/12/17 08/19/17  Duffy Bruce, MD    Physical Exam: Vitals:   08/12/17 1444 08/12/17 1729 08/12/17 1835 08/12/17 1900  BP: (!) 170/88 (!) 162/77 (!) 159/82 (!) 139/59  Pulse: 69 66 72 74  Resp: (!) 22 20 (!) 22 16  Temp: 98.1 F (36.7 C) 97.6 F (36.4 C)    TempSrc: Oral Oral    SpO2: 98% 98% 99% 96%  General:  Appears calm and comfortable and is NAD on RA; tight cough more c/w asthma/COPD than heart failure Eyes:  PERRL, EOMI, normal lids, iris ENT:  grossly normal hearing, lips & tongue, mmm; appropriate  dentition Neck:  no LAD, masses or thyromegaly; no carotid bruits Cardiovascular:  RRR, no m/r/g. No LE edema.  Respiratory:   CTA bilaterally with no wheezes/rales/rhonchi.  Normal respiratory effort. Abdomen:  soft, NT, ND, NABS Back:   normal alignment, no CVAT Skin:  no rash or induration seen on limited exam Musculoskeletal:  grossly normal tone BUE/BLE, good ROM, no bony abnormality Lower extremity:  No LE edema.  2+ distal pulses. Psychiatric:  grossly normal mood and affect, speech fluent and appropriate, AOx3 Neurologic:  CN 2-12 grossly intact, moves all extremities in coordinated fashion, sensation intact    Radiological Exams on Admission: Dg Chest 2 View  Result Date: 08/12/2017 CLINICAL DATA:  Shortness of breath and productive cough. EXAM: CHEST  2 VIEW COMPARISON:  Chest x-ray dated July 03, 2017. FINDINGS: The cardiomediastinal silhouette remains mildly enlarged. Increased pulmonary vascular congestion and interstitial markings. Diffuse peribronchial thickening. Trace left pleural effusion with adjacent left lower lobe opacities. Right basilar atelectasis. No pneumothorax. No acute osseous abnormality. IMPRESSION: 1. Mild cardiomegaly with peribronchial thickening and lower lobe predominant increased interstitial markings, likely representing interstitial edema versus airways inflammation. 2. Trace left pleural effusion. 3. Bibasilar opacities, favor atelectasis over pneumonia. Electronically Signed   By: Titus Dubin M.D.   On: 08/12/2017 11:48    EKG: Independently reviewed.  NSR with rate 72; Low voltage, nonspecific ST changes with no evidence of acute ischemia   Labs on Admission: I have personally reviewed the available labs and imaging studies at the time of the admission.  Pertinent labs:   Glucose 211 BNP 83 Troponin 0.00 x 2 Hgb 8.3, prior 8.6 on 9/21  Assessment/Plan Principal Problem:   Acute on chronic respiratory failure (HCC) Active  Problems:   Diabetes mellitus (HCC)   Essential hypertension   Diastolic heart failure (HCC)   OSA on CPAP   COPD (chronic obstructive pulmonary disease) (HCC)   Acute on chronic respiratory failure -Patient with multifactorial respiratory failure -She has underlying known OSA for which she wears CPAP (see below) -She was diagnosed with stage 1 diastolic dysfunction on Echo in 2/18; despite medication changes, this may be an ongoing contributor to her symptoms (see below) -Finally, she has a 30+ pack year smoking history and her current cough sounds bronchitic; suspect underlying COPD component which is also contributing to her persistent dyspnea (see below)  CHF -CXR may be consistent with pulmonary edema -Normal BNP -With normal BNP and equivocal CXR, persistent respiratory symptoms may be at least in part due to CHF -Will place in observation status with telemetry -Will request repeat echocardiogram -Will continue ASA -Will continue Cozaar -Will continue Metoprolol -CHF order set utilized -Was given Lasix 40 mg x 1 IV in ER and will start Lasix 20 mg PO daily (does not appear to have been on diuretics at home) -PRN  O2 for now -Normal kidney function at this time, will follow -Repeat EKG in AM -Troponins negative x 2, no chest pain, no concern for ACS at this time  COPD -Patient's shortness of breath and cough are most likely caused at least in part by COPD.  -Chest x-ray is not consistent with pneumonia but may indicate bronchitis -Nebulizers: scheduled Duoneb and prn albuterol -Will start prednisone '20mg'$  PO daily  OSA -Continue  CPAP -Patient does not know how setting so will use autopap/RT assistance  DM -A1c on 9/18 was 10.8 -hold Glucotrol, Victoza -Continue Lantus -Cover with moderate-scale SSI  HTN -Continue Cozaar, Lopressor  DVT prophylaxis: Lovenox  Code Status:  Full - confirmed with patient/family Family Communication: Husband present throughout  evaluation  Disposition Plan:  Home once clinically improved Consults called: Cardiology (by ER)  Admission status: It is my clinical opinion that referral for OBSERVATION is reasonable and necessary in this patient based on the above information provided. The aforementioned taken together are felt to place the patient at high risk for further clinical deterioration. However it is anticipated that the patient may be medically stable for discharge from the hospital within 24 to 48 hours.    Karmen Bongo MD [facility]  If note is complete, please contact covering daytime or nighttime physician. [email or web address] Password [email or web address]  08/12/2017, 8:17 PM

## 2017-08-12 NOTE — Telephone Encounter (Signed)
Spoke to patient about email she sent to [place].She stated for the past 3 days she has been having sob hard to walk from room to room.No chest pain.No swelling noticed.Advised she needs to go to [facility] ED.

## 2017-08-13 ENCOUNTER — Observation Stay (HOSPITAL_COMMUNITY): Payer: Medicare Other

## 2017-08-13 ENCOUNTER — Encounter (HOSPITAL_COMMUNITY): Payer: Self-pay | Admitting: Radiology

## 2017-08-13 ENCOUNTER — Other Ambulatory Visit: Payer: Self-pay

## 2017-08-13 DIAGNOSIS — Z833 Family history of diabetes mellitus: Secondary | ICD-10-CM | POA: Diagnosis not present

## 2017-08-13 DIAGNOSIS — Z90710 Acquired absence of both cervix and uterus: Secondary | ICD-10-CM | POA: Diagnosis not present

## 2017-08-13 DIAGNOSIS — J9620 Acute and chronic respiratory failure, unspecified whether with hypoxia or hypercapnia: Secondary | ICD-10-CM | POA: Diagnosis not present

## 2017-08-13 DIAGNOSIS — J9621 Acute and chronic respiratory failure with hypoxia: Secondary | ICD-10-CM | POA: Diagnosis present

## 2017-08-13 DIAGNOSIS — Z8249 Family history of ischemic heart disease and other diseases of the circulatory system: Secondary | ICD-10-CM | POA: Diagnosis not present

## 2017-08-13 DIAGNOSIS — I10 Essential (primary) hypertension: Secondary | ICD-10-CM | POA: Diagnosis not present

## 2017-08-13 DIAGNOSIS — Z96653 Presence of artificial knee joint, bilateral: Secondary | ICD-10-CM | POA: Diagnosis present

## 2017-08-13 DIAGNOSIS — M19022 Primary osteoarthritis, left elbow: Secondary | ICD-10-CM | POA: Diagnosis present

## 2017-08-13 DIAGNOSIS — M19032 Primary osteoarthritis, left wrist: Secondary | ICD-10-CM | POA: Diagnosis present

## 2017-08-13 DIAGNOSIS — I5033 Acute on chronic diastolic (congestive) heart failure: Secondary | ICD-10-CM

## 2017-08-13 DIAGNOSIS — Z794 Long term (current) use of insulin: Secondary | ICD-10-CM | POA: Diagnosis not present

## 2017-08-13 DIAGNOSIS — I2510 Atherosclerotic heart disease of native coronary artery without angina pectoris: Secondary | ICD-10-CM | POA: Diagnosis present

## 2017-08-13 DIAGNOSIS — Z9989 Dependence on other enabling machines and devices: Secondary | ICD-10-CM | POA: Diagnosis not present

## 2017-08-13 DIAGNOSIS — J441 Chronic obstructive pulmonary disease with (acute) exacerbation: Secondary | ICD-10-CM | POA: Diagnosis present

## 2017-08-13 DIAGNOSIS — E1165 Type 2 diabetes mellitus with hyperglycemia: Secondary | ICD-10-CM | POA: Diagnosis not present

## 2017-08-13 DIAGNOSIS — Z7982 Long term (current) use of aspirin: Secondary | ICD-10-CM | POA: Diagnosis not present

## 2017-08-13 DIAGNOSIS — J440 Chronic obstructive pulmonary disease with acute lower respiratory infection: Secondary | ICD-10-CM | POA: Diagnosis present

## 2017-08-13 DIAGNOSIS — M170 Bilateral primary osteoarthritis of knee: Secondary | ICD-10-CM | POA: Diagnosis present

## 2017-08-13 DIAGNOSIS — M479 Spondylosis, unspecified: Secondary | ICD-10-CM | POA: Diagnosis present

## 2017-08-13 DIAGNOSIS — G4733 Obstructive sleep apnea (adult) (pediatric): Secondary | ICD-10-CM | POA: Diagnosis present

## 2017-08-13 DIAGNOSIS — Z809 Family history of malignant neoplasm, unspecified: Secondary | ICD-10-CM | POA: Diagnosis not present

## 2017-08-13 DIAGNOSIS — E785 Hyperlipidemia, unspecified: Secondary | ICD-10-CM | POA: Diagnosis present

## 2017-08-13 DIAGNOSIS — I252 Old myocardial infarction: Secondary | ICD-10-CM | POA: Diagnosis not present

## 2017-08-13 DIAGNOSIS — Z6841 Body Mass Index (BMI) 40.0 and over, adult: Secondary | ICD-10-CM | POA: Diagnosis not present

## 2017-08-13 DIAGNOSIS — D563 Thalassemia minor: Secondary | ICD-10-CM | POA: Diagnosis present

## 2017-08-13 DIAGNOSIS — I110 Hypertensive heart disease with heart failure: Secondary | ICD-10-CM | POA: Diagnosis present

## 2017-08-13 DIAGNOSIS — J209 Acute bronchitis, unspecified: Secondary | ICD-10-CM | POA: Diagnosis not present

## 2017-08-13 DIAGNOSIS — J9601 Acute respiratory failure with hypoxia: Secondary | ICD-10-CM | POA: Diagnosis not present

## 2017-08-13 DIAGNOSIS — M19031 Primary osteoarthritis, right wrist: Secondary | ICD-10-CM | POA: Diagnosis present

## 2017-08-13 DIAGNOSIS — M19021 Primary osteoarthritis, right elbow: Secondary | ICD-10-CM | POA: Diagnosis present

## 2017-08-13 LAB — CBC WITH DIFFERENTIAL/PLATELET
Basophils Absolute: 0 10*3/uL (ref 0.0–0.1)
Basophils Relative: 0 %
Eosinophils Absolute: 0.2 10*3/uL (ref 0.0–0.7)
Eosinophils Relative: 2 %
HCT: 28 % — ABNORMAL LOW (ref 36.0–46.0)
Hemoglobin: 8.4 g/dL — ABNORMAL LOW (ref 12.0–15.0)
Lymphocytes Relative: 21 %
Lymphs Abs: 2.1 10*3/uL (ref 0.7–4.0)
MCH: 18.5 pg — ABNORMAL LOW (ref 26.0–34.0)
MCHC: 30 g/dL (ref 30.0–36.0)
MCV: 61.7 fL — ABNORMAL LOW (ref 78.0–100.0)
Monocytes Absolute: 0.4 10*3/uL (ref 0.1–1.0)
Monocytes Relative: 4 %
Neutro Abs: 7.3 10*3/uL (ref 1.7–7.7)
Neutrophils Relative %: 73 %
Platelets: 275 10*3/uL (ref 150–400)
RBC: 4.54 MIL/uL (ref 3.87–5.11)
RDW: 17 % — ABNORMAL HIGH (ref 11.5–15.5)
WBC: 10 10*3/uL (ref 4.0–10.5)

## 2017-08-13 LAB — GLUCOSE, CAPILLARY
Glucose-Capillary: 224 mg/dL — ABNORMAL HIGH (ref 65–99)
Glucose-Capillary: 265 mg/dL — ABNORMAL HIGH (ref 65–99)
Glucose-Capillary: 283 mg/dL — ABNORMAL HIGH (ref 65–99)
Glucose-Capillary: 343 mg/dL — ABNORMAL HIGH (ref 65–99)
Glucose-Capillary: 423 mg/dL — ABNORMAL HIGH (ref 65–99)
Glucose-Capillary: 449 mg/dL — ABNORMAL HIGH (ref 65–99)
Glucose-Capillary: 468 mg/dL — ABNORMAL HIGH (ref 65–99)

## 2017-08-13 LAB — BASIC METABOLIC PANEL
Anion gap: 9 (ref 5–15)
BUN: 15 mg/dL (ref 6–20)
CO2: 28 mmol/L (ref 22–32)
Calcium: 9.1 mg/dL (ref 8.9–10.3)
Chloride: 100 mmol/L — ABNORMAL LOW (ref 101–111)
Creatinine, Ser: 0.9 mg/dL (ref 0.44–1.00)
GFR calc Af Amer: >60 mL/min (ref >60)
GFR calc non Af Amer: >60 mL/min (ref >60)
Glucose, Bld: 312 mg/dL — ABNORMAL HIGH (ref 65–99)
Potassium: 4 mmol/L (ref 3.5–5.1)
Sodium: 137 mmol/L (ref 135–145)

## 2017-08-13 LAB — GLUCOSE, RANDOM: Glucose, Bld: 453 mg/dL — ABNORMAL HIGH (ref 65–99)

## 2017-08-13 MED ORDER — METHYLPREDNISOLONE SODIUM SUCC 40 MG IJ SOLR
40.0000 mg | Freq: Two times a day (BID) | INTRAMUSCULAR | Status: DC
  Administered 2017-08-13 – 2017-08-14 (×2): 40 mg via INTRAVENOUS
  Filled 2017-08-13 (×2): qty 1

## 2017-08-13 MED ORDER — FUROSEMIDE 10 MG/ML IJ SOLN
40.0000 mg | Freq: Every day | INTRAMUSCULAR | Status: DC
  Administered 2017-08-13 – 2017-08-14 (×2): 40 mg via INTRAVENOUS
  Filled 2017-08-13 (×2): qty 4

## 2017-08-13 MED ORDER — INSULIN GLARGINE 100 UNIT/ML ~~LOC~~ SOLN
35.0000 [IU] | Freq: Every day | SUBCUTANEOUS | Status: DC
  Administered 2017-08-14: 35 [IU] via SUBCUTANEOUS
  Filled 2017-08-13: qty 0.35

## 2017-08-13 MED ORDER — INSULIN ASPART 100 UNIT/ML ~~LOC~~ SOLN
10.0000 [IU] | Freq: Once | SUBCUTANEOUS | Status: AC
  Administered 2017-08-13: 10 [IU] via SUBCUTANEOUS

## 2017-08-13 MED ORDER — INSULIN ASPART 100 UNIT/ML ~~LOC~~ SOLN
15.0000 [IU] | Freq: Once | SUBCUTANEOUS | Status: AC
  Administered 2017-08-13: 15 [IU] via SUBCUTANEOUS

## 2017-08-13 MED ORDER — IPRATROPIUM-ALBUTEROL 0.5-2.5 (3) MG/3ML IN SOLN
3.0000 mL | Freq: Three times a day (TID) | RESPIRATORY_TRACT | Status: DC
  Administered 2017-08-14 (×2): 3 mL via RESPIRATORY_TRACT
  Filled 2017-08-13 (×2): qty 3

## 2017-08-13 MED ORDER — INSULIN GLARGINE 100 UNIT/ML ~~LOC~~ SOLN: 30.0000 [IU] | Freq: Every day | SUBCUTANEOUS | Status: DC
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
## 2017-08-13 NOTE — Progress Notes (Signed)
Patient ID: Lynn [name], female   DOB: 1955-10-17, 61 y.o.   MRN: [id number]    PROGRESS NOTE  Lynn [name]  FYB:[id number] DOB: 02-Jan-1956 DOA: 08/12/2017  PCP: [name], MD   Brief Narrative:  Pt is 60 yo female with DM, OSA on CPAP, HTN, HLD, diastolic CHF, presented to [facility] ED with main concern of several days duration of progressively worsening dyspnea at rest and with exertion. This was associated with mixed episodes on non productive and productive cough of yellow sputum, wheezing. Her cardiologist Dr. Ellyn Hack increased the dose of metoprolol from 25 --> 50 mg back in Sept 2018.   Assessment & Plan:  Acute on chronic respiratory failure - suspect this is multifactorial and secondary to acute on chronic diastolic CHF, acute COPD exacerbation - will place on lasix 40 mg IV QD and will need to follow daily weights, strict I/O - also monitor BMP, renal function  - follow up on ECHO, consider cardiology consult pending ECHO results - keep on tele - continue Aspirin, Metoprolol, Cozaar   Acute exacerbation of COPD - still wheezing, course breath sounds bilaterally - place on solumedrol and taper down as clinically indicated - follow on CT chest, rule out underlying PNA - hold off on ABX for now - continue bronchodilators scheduled and as needed   OSA - continue CPAP  DM - A1c on 9/18 was 10.8 - hold Glucotrol, Victoza - continue Lantus but increase the dose to 30 U  HTN - Continue Cozaar, Lopressor  DVT prophylaxis: Lovenox Sq Code Status: Full  Family Communication: Patient at bedside  Disposition Plan: to be determined   Consultants:   None  Procedures:   None  Antimicrobials:   None   Subjective: Still with significant dyspnea.   Objective: Vitals:   08/13/17 0808 08/13/17 1000 08/13/17 1419 08/13/17 1524  BP:  (!) 165/85  (!) 168/96  Pulse:  94  86  Resp:    16  Temp:  97.9 F (36.6 C)  97.9 F (36.6 C)  TempSrc:  Oral  Oral    SpO2: 95% 97% 96% 97%  Weight:      Height:        Intake/Output Summary (Last 24 hours) at 08/13/17 1654 Last data filed at 08/13/17 1600  Gross per 24 hour  Intake             1443 ml  Output             3650 ml  Net            -2207 ml   Filed Weights   08/12/17 2207 08/13/17 0500  Weight: 110.6 kg (243 lb 13.3 oz) 110 kg (242 lb 8.1 oz)    Examination:  General exam: Appears calm and comfortable  Respiratory system: Course breath sounds bilaterally with exp wheezing  Cardiovascular system: S1 & S2 heard, RRR. No JVD, murmurs, rubs, gallops or clicks. +1 bilateral LE edema  Gastrointestinal system: Abdomen is nondistended, soft and nontender. No organomegaly or masses felt. Normal bowel sounds heard. Central nervous system: Alert and oriented. No focal neurological deficits. Extremities: Symmetric 5 x 5 power. Skin: No rashes, lesions or ulcers Psychiatry: Judgement and insight appear normal. Mood & affect appropriate.    Data Reviewed: I have personally reviewed following labs and imaging studies  CBC:  Recent Labs Lab 08/12/17 1205 08/12/17 2347  WBC 9.7 10.0  NEUTROABS  --  7.3  HGB 8.3* 8.4*  HCT 27.9* 28.0*  MCV 61.7* 61.7*  PLT 281 355   Basic Metabolic Panel:  Recent Labs Lab 08/12/17 1205 08/13/17 0450  NA 140 137  K 3.7 4.0  CL 105 100*  CO2 28 28  GLUCOSE 211* 312*  BUN 14 15  CREATININE 0.92 0.90  CALCIUM 8.9 9.1   CBG:  Recent Labs Lab 08/12/17 2223 08/13/17 0016 08/13/17 0804 08/13/17 1230  GLUCAP 149* 224* 265* 283*   Urine analysis:    Component Value Date/Time   COLORURINE YELLOW 07/03/2017 1029   APPEARANCEUR HAZY (A) 07/03/2017 1029   LABSPEC 1.015 07/03/2017 1029   PHURINE 5.5 07/03/2017 1029   GLUCOSEU 250 (A) 07/03/2017 1029   HGBUR NEGATIVE 07/03/2017 1029   BILIRUBINUR NEGATIVE 07/03/2017 1029   KETONESUR 15 (A) 07/03/2017 1029   PROTEINUR NEGATIVE 07/03/2017 1029   UROBILINOGEN 0.2 08/18/2008 1535   NITRITE  NEGATIVE 07/03/2017 1029   LEUKOCYTESUR NEGATIVE 07/03/2017 1029   Radiology Studies: Dg Chest 2 View  Result Date: 08/12/2017 CLINICAL DATA:  Shortness of breath and productive cough. EXAM: CHEST  2 VIEW COMPARISON:  Chest x-ray dated July 03, 2017. FINDINGS: The cardiomediastinal silhouette remains mildly enlarged. Increased pulmonary vascular congestion and interstitial markings. Diffuse peribronchial thickening. Trace left pleural effusion with adjacent left lower lobe opacities. Right basilar atelectasis. No pneumothorax. No acute osseous abnormality. IMPRESSION: 1. Mild cardiomegaly with peribronchial thickening and lower lobe predominant increased interstitial markings, likely representing interstitial edema versus airways inflammation. 2. Trace left pleural effusion. 3. Bibasilar opacities, favor atelectasis over pneumonia. Electronically Signed   By: Titus Dubin M.D.   On: 08/12/2017 11:48    Scheduled Meds: . aspirin  81 mg Oral Daily  . atorvastatin  80 mg Oral q1800  . enoxaparin (LOVENOX) injection  40 mg Subcutaneous QHS  . folic acid  1 mg Oral BID  . furosemide  40 mg Intravenous Daily  . insulin aspart  0-15 Units Subcutaneous TID WC  . insulin aspart  0-5 Units Subcutaneous QHS  . insulin glargine  25 Units Subcutaneous Daily  . ipratropium-albuterol  3 mL Nebulization Q6H  . losartan  50 mg Oral Daily  . methylPREDNISolone (SOLU-MEDROL) injection  40 mg Intravenous Q12H  . metoprolol tartrate  25 mg Oral BID  . sodium chloride flush  3 mL Intravenous Q12H   Continuous Infusions: . sodium chloride       LOS: 0 days   Time spent: 25 minutes   Faye Ramsay, MD [facility] Pager [phone]  If 7PM-7AM, please contact night-coverage [email or web address] Password [email or web address] 08/13/2017, 4:54 PM

## 2017-08-13 NOTE — Progress Notes (Signed)
NP on call paged regarding pt's 0000 CBG of 224. Pt NPO after midnight. No coverage ordered.
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
## 2017-08-13 NOTE — Progress Notes (Signed)
Nutrition Brief Note  Patient identified as COPD gold.   Wt Readings from Last 15 Encounters:  08/13/17 242 lb 8.1 oz (110 kg)  07/10/17 243 lb 6.4 oz (110.4 kg)  07/04/17 242 lb (109.8 kg)  06/12/17 242 lb (109.8 kg)  06/07/17 241 lb 12.8 oz (109.7 kg)  02/28/17 241 lb 9.6 oz (109.6 kg)  02/26/17 238 lb 1.9 oz (108 kg)  02/21/17 236 lb 1.8 oz (107.1 kg)  03/18/17 239 lb 3.2 oz (108.5 kg)  01/31/17 237 lb 12.8 oz (107.9 kg)  01/22/17 236 lb 12.4 oz (107.4 kg)  12/27/16 238 lb (108 kg)  12/10/16 236 lb 6.4 oz (107.2 kg)  12/04/16 238 lb 12.8 oz (108.3 kg)  11/19/16 237 lb 1.6 oz (107.5 kg)    Body mass index is 42.96 kg/m. Patient meets criteria for morbidly obese based on current BMI. Pt reports no unintentional weight loss. Weight noted to be stable per the last 15 encounters.   Current diet order is carb modified, patient is consuming approximately 100% of meals at this time. Pt reports having great appetite prior to admission consuming three meals with snacks daily with complication.  Labs and medications reviewed.   No nutrition interventions warranted at this time. If nutrition issues arise, please consult RD.   Mariana Single RD, LDN Clinical Nutrition Pager # [phone]

## 2017-08-13 NOTE — Progress Notes (Signed)
  Echocardiogram 2D Echocardiogram has been performed.  Darlina Sicilian M 08/13/2017, 1:49 PM

## 2017-08-13 NOTE — Care Management Obs Status (Signed)
[facility] NOTIFICATION   Patient Details  Name: Lynn [name] MRN: [id number] Date of Birth: 05/17/1956   Medicare Observation Status Notification Given:  Yes    Purcell Mouton, RN 08/13/2017, 4:17 PM

## 2017-08-13 NOTE — Progress Notes (Addendum)
2220: NP on call, Baltazar Najjar, notified of pt's HS CBG of 423. Pt is hot, shaky, weak, and has blurred vision. 10 units of novolog ordered and administered.   2348: Pt's CBG 449 on reassessment. Baltazar Najjar, NP paged. 15 units insulin ordered and administered.  0001: CBG 468 on reassessment. Baltazar Najjar, NP paged. No orders placed.  0128: CBG 473. Baltazar Najjar, NP paged. 8 units novolog and 10 units lantus ordered and administered.  0156: CBG 412. Baltazar Najjar, NP paged. No orders placed.

## 2017-08-13 NOTE — Progress Notes (Signed)
Inpatient Diabetes Program Recommendations  AACE/ADA: New Consensus Statement on Inpatient Glycemic Control (2015)  Target Ranges:  Prepandial:   less than 140 mg/dL      Peak postprandial:   less than 180 mg/dL (1-2 hours)      Critically ill patients:  140 - 180 mg/dL   Lab Results  Component Value Date   GLUCAP 283 (H) 08/13/2017   HGBA1C 10.8 (H) 07/03/2017    Review of Glycemic Control  Started Lantus 25 units QAM. Will likely need meal coverage insulin.  Recommendations: Add Novolog 5 units tidwc for meal coverage insulin if pt eats > 50% meal.    From Diabetes Coordinator note on 07/04/2017:  Spoke with pt and husband @ bedside about A1C results 10.8 (average CBG 283 over the past 2-3 months) with them and explained what an A1C is, basic pathophysiology of DM Type 2, basic home care, basic diabetes diet nutrition principles, importance of checking CBGs and maintaining good CBG control to prevent long-term and short-term complications. Reviewed signs and symptoms of hyperglycemia and hypoglycemia and how to treat hypoglycemia at home. Also reviewed blood sugar goals at home.  RNs to provide ongoing basic DM education at bedside with this patient.  Noted dietician to see pt.   Will continue to follow.  Thank you. Lorenda Peck, RD, LDN, CDE Inpatient Diabetes Coordinator [phone]

## 2017-08-14 DIAGNOSIS — J209 Acute bronchitis, unspecified: Secondary | ICD-10-CM

## 2017-08-14 DIAGNOSIS — J9601 Acute respiratory failure with hypoxia: Secondary | ICD-10-CM

## 2017-08-14 DIAGNOSIS — Z9989 Dependence on other enabling machines and devices: Secondary | ICD-10-CM

## 2017-08-14 DIAGNOSIS — E1165 Type 2 diabetes mellitus with hyperglycemia: Secondary | ICD-10-CM

## 2017-08-14 DIAGNOSIS — Z794 Long term (current) use of insulin: Secondary | ICD-10-CM

## 2017-08-14 DIAGNOSIS — G4733 Obstructive sleep apnea (adult) (pediatric): Secondary | ICD-10-CM

## 2017-08-14 DIAGNOSIS — J440 Chronic obstructive pulmonary disease with acute lower respiratory infection: Secondary | ICD-10-CM

## 2017-08-14 DIAGNOSIS — I5033 Acute on chronic diastolic (congestive) heart failure: Secondary | ICD-10-CM

## 2017-08-14 DIAGNOSIS — I10 Essential (primary) hypertension: Secondary | ICD-10-CM

## 2017-08-14 LAB — BASIC METABOLIC PANEL
Anion gap: 13 (ref 5–15)
BUN: 21 mg/dL — ABNORMAL HIGH (ref 6–20)
CO2: 28 mmol/L (ref 22–32)
Calcium: 9.7 mg/dL (ref 8.9–10.3)
Chloride: 95 mmol/L — ABNORMAL LOW (ref 101–111)
Creatinine, Ser: 1.07 mg/dL — ABNORMAL HIGH (ref 0.44–1.00)
GFR calc Af Amer: >60 mL/min (ref >60)
GFR calc non Af Amer: 54 mL/min — ABNORMAL LOW (ref >60)
Glucose, Bld: 307 mg/dL — ABNORMAL HIGH (ref 65–99)
Potassium: 3.9 mmol/L (ref 3.5–5.1)
Sodium: 136 mmol/L (ref 135–145)

## 2017-08-14 LAB — ECHOCARDIOGRAM COMPLETE
Height: 63 in
Weight: 3880.1 oz

## 2017-08-14 LAB — GLUCOSE, CAPILLARY
Glucose-Capillary: 298 mg/dL — ABNORMAL HIGH (ref 65–99)
Glucose-Capillary: 412 mg/dL — ABNORMAL HIGH (ref 65–99)
Glucose-Capillary: 473 mg/dL — ABNORMAL HIGH (ref 65–99)

## 2017-08-14 LAB — CBC
HCT: 30.2 % — ABNORMAL LOW (ref 36.0–46.0)
Hemoglobin: 9.4 g/dL — ABNORMAL LOW (ref 12.0–15.0)
MCH: 18.7 pg — ABNORMAL LOW (ref 26.0–34.0)
MCHC: 31.1 g/dL (ref 30.0–36.0)
MCV: 60 fL — ABNORMAL LOW (ref 78.0–100.0)
Platelets: 319 10*3/uL (ref 150–400)
RBC: 5.03 MIL/uL (ref 3.87–5.11)
RDW: 16.8 % — ABNORMAL HIGH (ref 11.5–15.5)
WBC: 16.6 10*3/uL — ABNORMAL HIGH (ref 4.0–10.5)

## 2017-08-14 MED ORDER — FLUTICASONE-SALMETEROL 250-50 MCG/DOSE IN AEPB: 1.0000 | INHALATION_SPRAY | Freq: Two times a day (BID) | RESPIRATORY_TRACT | 1 refills | Status: DC

## 2017-08-14 MED ORDER — INSULIN ASPART 100 UNIT/ML ~~LOC~~ SOLN
20.0000 [IU] | Freq: Once | SUBCUTANEOUS | Status: AC
  Administered 2017-08-14: 20 [IU] via SUBCUTANEOUS

## 2017-08-14 MED ORDER — INSULIN GLARGINE 100 UNIT/ML ~~LOC~~ SOLN
10.0000 [IU] | SUBCUTANEOUS | Status: AC
  Administered 2017-08-14: 10 [IU] via SUBCUTANEOUS
  Filled 2017-08-14: qty 0.1

## 2017-08-14 MED ORDER — PREDNISONE 20 MG PO TABS: ORAL_TABLET | ORAL | 0 refills | Status: AC

## 2017-08-14 MED ORDER — INSULIN ASPART 100 UNIT/ML ~~LOC~~ SOLN
8.0000 [IU] | Freq: Once | SUBCUTANEOUS | Status: AC
  Administered 2017-08-14: 8 [IU] via SUBCUTANEOUS

## 2017-08-14 MED ORDER — FUROSEMIDE 40 MG PO TABS: 40.0000 mg | ORAL_TABLET | Freq: Every day | ORAL | 1 refills | Status: DC

## 2017-08-14 MED ORDER — DOXYCYCLINE HYCLATE 100 MG PO TABS
100.0000 mg | ORAL_TABLET | Freq: Two times a day (BID) | ORAL | 0 refills | Status: AC
Start: 2017-08-14 — End: 2017-08-20

## 2017-08-14 MED ORDER — IPRATROPIUM-ALBUTEROL 20-100 MCG/ACT IN AERS: 1.0000 | INHALATION_SPRAY | Freq: Four times a day (QID) | RESPIRATORY_TRACT | 3 refills | Status: DC | PRN

## 2017-08-14 MED ORDER — INSULIN GLARGINE 100 UNIT/ML SOLOSTAR PEN: 30.0000 [IU] | PEN_INJECTOR | Freq: Every day | SUBCUTANEOUS | Status: DC

## 2017-08-14 NOTE — Progress Notes (Signed)
Pt discharging with [facility] for HHRN/COPD, CHF teaching.

## 2017-08-14 NOTE — Progress Notes (Signed)
Inpatient Diabetes Program Recommendations  AACE/ADA: New Consensus Statement on Inpatient Glycemic Control (2015)  Target Ranges:  Prepandial:   less than 140 mg/dL      Peak postprandial:   less than 180 mg/dL (1-2 hours)      Critically ill patients:  140 - 180 mg/dL   Results for Lynn [name], Lynn [name] (MRN [id number]) as of 08/14/2017 13:56  Ref. Range 08/13/2017 00:16 08/13/2017 08:04 08/13/2017 12:30 08/13/2017 17:15 08/13/2017 21:23 08/13/2017 23:18 08/14/2017 00:01 08/14/2017 00:59 08/14/2017 01:56 08/14/2017 04:38  Glucose-Capillary Latest Ref Range: 65 - 99 mg/dL 224 (H) 265 (H) 283 (H) 343 (H) 423 (H) 449 (H) 468 (H) 473 (H) 412 (H) 298 (H)  Results for Lynn [name], Lynn [name] (MRN [id number]) as of 08/14/2017 13:56  Ref. Range 11/17/2016 04:48 06/12/2017 12:43 07/03/2017 16:57  Hemoglobin A1C Latest Ref Range: 4.8 - 5.6 % 7.7 (H) 11.0 (H) 10.8 (H)   Review of Glycemic Control  Diabetes history: DM2 Outpatient Diabetes medications: Lantus 25 units daily, Glipizide 5 mg BID, Victoza 1.2 mg daily Current orders for Inpatient glycemic control:Lantus 35 units daily (increased from 25 to 35 units today), Novolog 0-15 units TID with meals, Novolog 0-5 units QHS  Inpatient Diabetes Program Recommendations: Correction (SSI): Please consider increasing correction to Novolog resistant scale (0-20 units) TID with meals. Insulin - Meal Coverage: If steroids are continued, please consider ordering Novolog 15 units TID with meals if patient eats at least 50% of meals.  NOTE: Patient is ordered Solumedrol 40 mg Q12H which is contributing to hyperglycemia.   Thanks, Barnie Alderman, RN, MSN, CDE Diabetes Coordinator Inpatient Diabetes Program [phone] (Team Pager from 8am to 5pm)

## 2017-08-14 NOTE — Discharge Summary (Signed)
Physician Discharge Summary  Binnie Droessler ZES:[id number] DOB: 12-Jul-1956 DOA: 08/12/2017  PCP: [name], MD  Admit date: 08/12/2017 Discharge date: 08/14/2017  Time spent: 35 minutes  Recommendations for Outpatient Follow-up:  Repeat BMET to follow electrolytes and renal function  Reassess CBG's and closely follow her diabetes, adjust hypoglycemic regimen as needed  Patient will need outpatient follow up with pulmonary service for PFT's and further treatment of underlying chronic obstructive lung disease. Reassess volume status and adjust medication regimen from heart failure stand point as needed (cardiology actively following patient for this)  Discharge Diagnoses:  Principal Problem:   Acute respiratory failure with hypoxia ([place]) Active Problems:   Diabetes mellitus ([place])   Essential hypertension   Diastolic heart failure (HCC)   OSA on CPAP   COPD with acute bronchitis ([place]) morbid obesity   Discharge Condition: stable and improved. Patient discharge home with [facility]; instructed to follow up with PCP in 10 days and already with scheduled visit to see her cardiologist.  Diet recommendation: heart healthy diet, low carbohydrates and low calorie diet.  Filed Weights   08/12/17 2207 08/13/17 0500 08/14/17 0440  Weight: 110.6 kg (243 lb 13.3 oz) 110 kg (242 lb 8.1 oz) 108.8 kg (239 lb 13.8 oz)    History of present illness:  61 yo female with DM, OSA on CPAP, HTN, HLD, diastolic CHF, presented to [facility] ED with main concern of several days duration of progressively worsening dyspnea at rest and with exertion. This was associated with mixed episodes on non productive and productive cough of yellow sputum, wheezing. Patient admitted for treatment of COPD exacerbation and acute on chronic diastolic HF.  Hospital Course:  1-acute resp failure with hypoxia: due to acute on chronic diastolic CHF and COPD exacerbation with bronchiectasis. -patient responded well to IV diuresis,  solumedrol and nebulizer therapy -will discharge on tapering prednisone, advair, doxycycline, lasix daily and continuation of her b-blocker and cozaar. -patient will need PFT's and follow up with pulmonary service as an outpatient -will also follow up with her cardiologist for further adjustment in her heart failure management  -advise to follow low sodium diet, to check daily weights and to maintain adequate hydration   -no CP, improved breathing and not need for oxygen supplementation. Also speaking in full sentences.  2-OSA -continue CPAP QHS  3-uncontrolled diabetes with hyperglycemia -most recent A1C 10.8 -will adjust her lantus dose; continue glipizide and victoza -close follow up as an outpatient and further adjustments base on CBG's fluctuation -anticipate increase in CBG with use of steroids, but with expected trending down after tapering.  4-HTN -stable and well controlled -will continue current antihypertensive regimen   5-hypokalemia -due to diuresis and nebulizer treatment most likely -repleted -discharge on daily maintenance dose of potassium  -check BMET at follow up visit   6-morbid obesity  -Body mass index is 42.49 kg/m. -low calorie diet, weight loss and exercise discussed with patient.  Procedures:  See below for x-ray reports   2-D echo - Left ventricle: The cavity size was normal. There was severe   concentric hypertrophy. Systolic function was vigorous. The   estimated ejection fraction was in the range of 65% to 70%. Wall   motion was normal; there were no regional wall motion   abnormalities. The study is not technically sufficient to allow   evaluation of LV diastolic function. - Aortic valve: Sclerosis without stenosis. There was no   regurgitation. - Mitral valve: Heavy posterior MAC. Moderate stenosis. Mildly  thickened leaflets . There was mild regurgitation. Mean gradient   (D): 9 mm Hg. Peak gradient (D): 20 mm Hg. Valve area by    continuity equation (using LVOT flow): 1.36 cm^2. - Left atrium: Moderately dilated. - Right atrium: The atrium was mildly dilated. - Tricuspid valve: There was trivial regurgitation. - Pulmonary arteries: PA peak pressure: 27 mm Hg (S). - Systemic veins: The IVC measures <2.1 cm, but does not collapse   >50%, suggesting an elevated RA pressure of 8 mmHg.  Impressions: - Compared to a prior study in 11/2016, the LVEF is higher. There is   heavy MAC (mostly posterior) with moderate mitral stenosis (mean   gradient 9 mmHG - MVA around 1.4 cm2) and mild mitral   regurgitation with mild to moderate LAE.  Consultations:  None   Discharge Exam: Vitals:   08/14/17 1353 08/14/17 1407  BP: (!) 143/72   Pulse: 72 85  Resp: 18 15  Temp: 98.2 F (36.8 C)   SpO2: 91% 100%    General: afebrile, comfortable, speaking in full sentences and no requiring oxygen supplementation. Denies CP, nausea, vomiting and abd pain.  Cardiovascular: RRR, no rubs, no gallops, no JVD Respiratory: good air movement, scattered rhonchi and very little end exp wheezing, normal resp effort Abd: soft, NT, ND, positive BS Extremities: no edema, no cyanosis, no clubbing    Discharge Instructions   Discharge Instructions    (HEART FAILURE PATIENTS) Call MD:  Anytime you have any of the following symptoms: 1) 3 pound weight gain in 24 hours or 5 pounds in 1 week 2) shortness of breath, with or without a dry hacking cough 3) swelling in the hands, feet or stomach 4) if you have to sleep on extra pillows at night in order to breathe.    Complete by:  As directed    Diet - low sodium heart healthy    Complete by:  As directed    Discharge instructions    Complete by:  As directed    Take medications as prescribed  Follow up with PCP in 10 days Keep yourself well hydrated  Follow up low sodium and low carbohydrates diet  Use inhalers as prescribed     Current Discharge Medication List    START taking these  medications   Details  doxycycline (VIBRA-TABS) 100 MG tablet Take 1 tablet (100 mg total) by mouth 2 (two) times daily. Qty: 12 tablet, Refills: 0    Fluticasone-Salmeterol (ADVAIR DISKUS) 250-50 MCG/DOSE AEPB Inhale 1 puff into the lungs 2 (two) times daily. Qty: 60 each, Refills: 1    furosemide (LASIX) 40 MG tablet Take 1 tablet (40 mg total) by mouth daily. Qty: 30 tablet, Refills: 1    Ipratropium-Albuterol (COMBIVENT RESPIMAT) 20-100 MCG/ACT AERS respimat Inhale 1 puff into the lungs every 6 (six) hours as needed for wheezing or shortness of breath. Qty: 1 Inhaler, Refills: 3    potassium chloride SA (K-DUR,KLOR-CON) 20 MEQ tablet Take 2 tablets (40 mEq total) by mouth daily. Qty: 14 tablet, Refills: 0    predniSONE (DELTASONE) 20 MG tablet Take 3 tablets by mouth daily X1 day, then 2 tablets by mouth daily X 2 days; then 1 tablet by mouth daily X 2 days; then 1/2 tablet by mouth daily X 3 days and stop prednisone. Qty: 12 tablet, Refills: 0      CONTINUE these medications which have CHANGED   Details  Insulin Glargine (LANTUS) 100 UNIT/ML Solostar Pen Inject 30 Units into  the skin daily.      CONTINUE these medications which have NOT CHANGED   Details  aspirin 81 MG chewable tablet Chew 1 tablet (81 mg total) by mouth daily. Qty: 30 tablet    atorvastatin (LIPITOR) 80 MG tablet Take 1 tablet (80 mg total) by mouth daily at 6 PM. Qty: 90 tablet, Refills: 3   Associated Diagnoses: NSTEMI (non-ST elevated myocardial infarction) ([place]); Hyperlipidemia associated with type 2 diabetes mellitus (HCC)    cetirizine (ZYRTEC) 10 MG tablet Take 10 mg by mouth daily as needed for allergies.    folic acid (FOLVITE) 1 MG tablet Take 2 tablets (2 mg total) by mouth daily. Qty: 180 tablet, Refills: 4   Associated Diagnoses: Other iron deficiency anemia; Thalassemia trait, alpha    glipiZIDE (GLUCOTROL) 5 MG tablet Take 1 tablet (5 mg total) by mouth 2 (two) times daily before a  meal. Qty: 180 tablet, Refills: 3   Associated Diagnoses: Diabetes mellitus type 2 in obese (HCC)    liraglutide (VICTOZA) 18 MG/3ML SOPN START WITH 0.6 MG INJECTED [place] DAILY FOR ONE WEEK, THEN INCREASE TO 1.2 MG DAILY Qty: 6 mL, Refills: 3   Associated Diagnoses: Diabetes mellitus type 2 in obese (HCC)    losartan (COZAAR) 50 MG tablet Take 1 tablet (50 mg total) by mouth daily. Qty: 60 tablet, Refills: 11    metoprolol tartrate (LOPRESSOR) 25 MG tablet TAKE 1 TABLET (25 MG TOTAL) BY MOUTH 2 (TWO) TIMES DAILY. Qty: 60 tablet, Refills: 6    Multiple Vitamin (MULTIVITAMIN WITH MINERALS) TABS tablet Take 1 tablet by mouth daily. Reported on 01/03/2016    nitroGLYCERIN (NITROSTAT) 0.4 MG SL tablet PLACE 1 TABLET UNDER THE TONGUE EVERY 5MIN AS NEEDED FOR 3 DOSES FOR CHEST PAIN Qty: 25 tablet, Refills: 0    Oxymetazoline HCl (AFRIN 12 HOUR NA) Place 1 spray into both nostrils daily as needed (ALLERGIES/CONGESTION).    PRESCRIPTION MEDICATION Inhale into the lungs at bedtime. CPAP    B-D UF III MINI PEN NEEDLES 31G X 5 MM MISC USE DAILY WITH VICTOZA Qty: 100 each, Refills: 3    !! blood glucose meter kit and supplies KIT Use up to four times daily as directed.Dx: E11.9 Qty: 1 each, Refills: 0    !! Blood Glucose Monitoring Suppl (ACCU-CHEK AVIVA PLUS) w/Device KIT Dispense 1 glucose testing kit . Use twice daily Qty: 1 kit, Refills: 0   Associated Diagnoses: Diabetes mellitus type 2 in obese ([place])    CONTOUR NEXT TEST test strip USE UP TO FOUR TIMES DAILY AS DIRECTLY. DX E11.9 Qty: 400 each, Refills: 3     !! - Potential duplicate medications found. Please discuss with provider.    STOP taking these medications     naproxen sodium (ALEVE) 220 MG tablet        Allergies  Allergen Reactions  . Penicillins Hives    Childhood allergy Has patient had a PCN reaction causing immediate rash, facial/tongue/throat swelling, SOB or lightheadedness with hypotension: Yes Has patient had a  PCN reaction causing severe rash involving mucus membranes or skin necrosis: No Has patient had a PCN reaction that required hospitalization No Has patient had a PCN reaction occurring within the last 10 years: No If all of the above answers are "NO", then may proceed with Cephalosporin use.  . Metformin And Related     Diarrhea, bleeding   Follow-up Information    Leonie Man, MD Follow up.   Specialty:  Cardiology Why:  Call to set up an appointment in the next 3-5 days. It is VERY important to follow-up with Dr. Ellyn Hack and/or your primary doctor for repeat LAB WORK, and weight check Contact information: [address] [place] [address] [phone]        Copland, Gay Filler, MD. Schedule an appointment as soon as possible for a visit in 10 day(s).   Specialty:  Family Medicine Contact information: [place] [address] [phone]            The results of significant diagnostics from this hospitalization (including imaging, microbiology, ancillary and laboratory) are listed below for reference.    Significant Diagnostic Studies: Dg Chest 2 View  Result Date: 08/12/2017 CLINICAL DATA:  Shortness of breath and productive cough. EXAM: CHEST  2 VIEW COMPARISON:  Chest x-ray dated July 03, 2017. FINDINGS: The cardiomediastinal silhouette remains mildly enlarged. Increased pulmonary vascular congestion and interstitial markings. Diffuse peribronchial thickening. Trace left pleural effusion with adjacent left lower lobe opacities. Right basilar atelectasis. No pneumothorax. No acute osseous abnormality. IMPRESSION: 1. Mild cardiomegaly with peribronchial thickening and lower lobe predominant increased interstitial markings, likely representing interstitial edema versus airways inflammation. 2. Trace left pleural effusion. 3. Bibasilar opacities, favor atelectasis over pneumonia. Electronically Signed   By: Titus Dubin  M.D.   On: 08/12/2017 11:48   Ct Chest Wo Contrast  Result Date: 08/13/2017 CLINICAL DATA:  61 year old female with chronic dyspnea and mild chest pain and cough. EXAM: CT CHEST WITHOUT CONTRAST TECHNIQUE: Multidetector CT imaging of the chest was performed following the standard protocol without IV contrast. COMPARISON:  Chest radiograph dated 08/12/2017 FINDINGS: Evaluation of this exam is limited in the absence of intravenous contrast. Cardiovascular: There is borderline cardiomegaly. No pericardial effusion. Advanced multi vessel coronary vascular calcification. There is dense calcification of the mitral annulus. There is mild atherosclerotic calcification of the thoracic aorta. The central pulmonary artery is are grossly unremarkable. Mediastinum/Nodes: There is no hilar or mediastinal adenopathy. The esophagus and the thyroid gland are grossly unremarkable. Lungs/Pleura: Small bilateral pleural effusions with minimal atelectatic changes of the lung bases. Linear left upper lobe atelectatic changes noted there is no pneumothorax. The central airways are patent. Upper Abdomen: No acute abnormality. Musculoskeletal: No chest wall mass or suspicious bone lesions identified. IMPRESSION: 1. Small bilateral pleural effusions with minimal compressive atelectasis of the lung bases. Pneumonia is not excluded. Clinical correlation is recommended. 2. Advanced coronary vascular calcification as well as coarse calcification of the mitral annulus. Cardiology referral is advised. 3. Aortic Atherosclerosis (ICD10-I70.0). Electronically Signed   By: Anner Crete M.D.   On: 08/13/2017 19:56    Microbiology: No results found for this or any previous visit (from the past 240 hour(s)).   Labs: Basic Metabolic Panel:  Recent Labs Lab 08/12/17 1205 08/13/17 0450 08/13/17 2245 08/14/17 0445  NA 140 137  --  136  K 3.7 4.0  --  3.9  CL 105 100*  --  95*  CO2 28 28  --  28  GLUCOSE 211* 312* 453* 307*  BUN  14 15  --  21*  CREATININE 0.92 0.90  --  1.07*  CALCIUM 8.9 9.1  --  9.7   CBC:  Recent Labs Lab 08/12/17 1205 08/12/17 2347 08/14/17 0445  WBC 9.7 10.0 16.6*  NEUTROABS  --  7.3  --   HGB 8.3* 8.4* 9.4*  HCT 27.9* 28.0* 30.2*  MCV 61.7* 61.7* 60.0*  PLT  281 275 319   BNP (last 3 results)  Recent Labs  08/12/17 1207  BNP 83.0   CBG:  Recent Labs Lab 08/13/17 2318 08/14/17 0001 08/14/17 0059 08/14/17 0156 08/14/17 0438  GLUCAP 449* 468* 473* 412* 298*     Signed:  Barton Dubois MD.  [facility] 08/14/2017, 3:45 PM

## 2017-08-14 NOTE — Progress Notes (Signed)
Physical Therapy Treatment Patient Details Name: Lynn [name] MRN: [id number] DOB: [birth year] Today's Date: 08/14/2017    History of Present Illness 61 yo admitted for acute bronchitis.   PMHx: DM, HTN, CAD, obestity, bil TKA    PT Comments    Ambulated 70 feet with rolator with min guard, RA O2 91% and HR 97. Reported tightness in chest that diminished with rest and instructed on pursed lipped breathing. VC's required for sit to stand for hand placement and safety. Educated on using the rolator walker with locks, chair, and using the wall for support when sitting. Positioned in recliner with patient reporting feeling better with rest.  Follow Up Recommendations        Equipment Recommendations    rollator [id number])   Recommendations for Other Services       Precautions / Restrictions Precautions Precautions: Fall Precaution Comments: monitor sats    Mobility  Bed Mobility Overal bed mobility: Independent                Transfers Overall transfer level: Needs assistance Equipment used: 4-wheeled walker Transfers: Sit to/from Stand Sit to Stand: Supervision         General transfer comment: Only required supervision, VCs for hand placement and safety with rollator  Ambulation/Gait Ambulation/Gait assistance: Supervision Ambulation Distance (Feet): 70 Feet Assistive device: 4-wheeled walker Gait Pattern/deviations: Step-through pattern     General Gait Details: Educated on rollator use and safety, instructed on pursed lip breathing, Sats >91%    Stairs            Wheelchair Mobility    Modified Rankin (Stroke Patients Only)       Balance                                            Cognition Arousal/Alertness: Awake/alert Behavior During Therapy: WFL for tasks assessed/performed Overall Cognitive Status: Within Functional Limits for tasks assessed                                        Exercises       General Comments        Pertinent Vitals/Pain Pain Assessment: 0-10 Pain Score: 2  Pain Location: L upper quadrant Pain Descriptors / Indicators: Pressure Pain Intervention(s): Monitored during session;Repositioned    Home Living                      Prior Function            PT Goals (current goals can now be found in the care plan section)      Frequency    Min 3X/week      PT Plan      Co-evaluation              AM-PAC PT "6 Clicks" Daily Activity  Outcome Measure  Difficulty turning over in bed (including adjusting bedclothes, sheets and blankets)?: None Difficulty moving from lying on back to sitting on the side of the bed? : None Difficulty sitting down on and standing up from a chair with arms (e.g., wheelchair, bedside commode, etc,.)?: None Help needed moving to and from a bed to chair (including a wheelchair)?: None Help needed walking in hospital room?: A Little Help needed climbing 3-5 steps  with a railing? : A Little 6 Click Score: 22    End of Session Equipment Utilized During Treatment: Gait belt Activity Tolerance: Patient tolerated treatment well Patient left: in chair;with call bell/phone within reach   PT Visit Diagnosis: Difficulty in walking, not elsewhere classified (R26.2)       Almond Lint, SPTA [facility] [place]  PTA [place] Pager      [phone]

## 2017-08-15 ENCOUNTER — Ambulatory Visit: Payer: Medicare Other | Admitting: Physician Assistant

## 2017-08-15 LAB — GLUCOSE, CAPILLARY
Glucose-Capillary: 325 mg/dL — ABNORMAL HIGH (ref 65–99)
Glucose-Capillary: 432 mg/dL — ABNORMAL HIGH (ref 65–99)
Glucose-Capillary: 448 mg/dL — ABNORMAL HIGH (ref 65–99)

## 2017-08-16 ENCOUNTER — Telehealth: Payer: Self-pay

## 2017-08-16 ENCOUNTER — Telehealth: Payer: Self-pay | Admitting: *Deleted

## 2017-08-16 NOTE — Telephone Encounter (Signed)
Received Physician Orders from [facility]; forwarded to provider/SLS 11/02

## 2017-08-16 NOTE — Telephone Encounter (Signed)
08/16/17  TCM Hospital Follow UP  Transition Care Management Follow-up Telephone Call  ADMISSION DATE: 08/12/17  DISCHARGE DATE: 08/14/17   How have you been since you were released from the hospital?  Patient states she has started to build her strength back. Has a few issues regarding Breathing but her medications do help. Wheezing and SOB still present upon exertion.    Do you understand why you were in the hospital? Yes   Do you understand the discharge instrcutions? Yes    Items Reviewed:  Medications reviewed:  Reviewed with patient. States she has not picked up Potassium and needs more Multivitamins.    Allergies reviewed: Yes   Dietary changes reviewed: Heart healthy Low Carbohydrate   Referrals reviewed: Appointment scheduled with provider for hospital follow up.   Functional Questionnaire:   Activities of Daily Living (ADLs): Patient states she can do all.  Any patient concerns? Needs better understanding of her diagnosis   Confirmed importance and date/time of follow-up visits scheduled: Yes   Confirmed with patient if condition begins to worsen call PCP or go to the ER. Yes    Patient was given the office number and encouragred to call back with questions or concerns. Yes

## 2017-08-21 ENCOUNTER — Telehealth: Payer: Self-pay | Admitting: Family Medicine
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
## 2017-08-22 ENCOUNTER — Ambulatory Visit: Payer: Medicare Other | Admitting: Family Medicine

## 2017-08-22 ENCOUNTER — Encounter: Payer: Self-pay | Admitting: Family Medicine

## 2017-08-22 VITALS — BP 130/76 | HR 88 | Temp 98.4°F | Resp 16 | Wt 240.6 lb

## 2017-08-22 DIAGNOSIS — I10 Essential (primary) hypertension: Secondary | ICD-10-CM

## 2017-08-22 DIAGNOSIS — E1169 Type 2 diabetes mellitus with other specified complication: Secondary | ICD-10-CM | POA: Diagnosis not present

## 2017-08-22 DIAGNOSIS — E669 Obesity, unspecified: Secondary | ICD-10-CM | POA: Diagnosis not present

## 2017-08-22 DIAGNOSIS — J449 Chronic obstructive pulmonary disease, unspecified: Secondary | ICD-10-CM

## 2017-08-22 DIAGNOSIS — Z9989 Dependence on other enabling machines and devices: Secondary | ICD-10-CM | POA: Diagnosis not present

## 2017-08-22 DIAGNOSIS — G4733 Obstructive sleep apnea (adult) (pediatric): Secondary | ICD-10-CM | POA: Diagnosis not present

## 2017-08-22 DIAGNOSIS — Z5181 Encounter for therapeutic drug level monitoring: Secondary | ICD-10-CM | POA: Diagnosis not present

## 2017-08-22 LAB — CBC
HCT: 33.5 % — ABNORMAL LOW (ref 36.0–46.0)
Hemoglobin: 10.1 g/dL — ABNORMAL LOW (ref 12.0–15.0)
MCHC: 30 g/dL (ref 30.0–36.0)
MCV: 60.7 fl — ABNORMAL LOW (ref 78.0–100.0)
Platelets: 351 10*3/uL (ref 150.0–400.0)
RBC: 5.53 Mil/uL — ABNORMAL HIGH (ref 3.87–5.11)
RDW: 17.8 % — ABNORMAL HIGH (ref 11.5–15.5)
WBC: 10.8 10*3/uL — ABNORMAL HIGH (ref 4.0–10.5)

## 2017-08-22 LAB — BASIC METABOLIC PANEL
BUN: 15 mg/dL (ref 6–23)
CO2: 28 mEq/L (ref 19–32)
Calcium: 9.6 mg/dL (ref 8.4–10.5)
Chloride: 101 mEq/L (ref 96–112)
Creatinine, Ser: 0.97 mg/dL (ref 0.40–1.20)
GFR: 74.45 mL/min (ref >60.00)
Glucose, Bld: 204 mg/dL — ABNORMAL HIGH (ref 70–99)
Potassium: 3.9 mEq/L (ref 3.5–5.1)
Sodium: 136 mEq/L (ref 135–145)
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
## 2017-08-23 ENCOUNTER — Encounter: Payer: Self-pay | Admitting: Cardiology

## 2017-08-23 ENCOUNTER — Ambulatory Visit: Payer: Medicare Other | Admitting: Cardiology

## 2017-08-23 ENCOUNTER — Telehealth: Payer: Self-pay | Admitting: Family Medicine

## 2017-08-23 VITALS — BP 126/76 | HR 79 | Ht 63.0 in | Wt 238.0 lb

## 2017-08-23 DIAGNOSIS — I342 Nonrheumatic mitral (valve) stenosis: Secondary | ICD-10-CM | POA: Diagnosis not present

## 2017-08-23 DIAGNOSIS — I2510 Atherosclerotic heart disease of native coronary artery without angina pectoris: Secondary | ICD-10-CM | POA: Diagnosis not present

## 2017-08-23 DIAGNOSIS — I5032 Chronic diastolic (congestive) heart failure: Secondary | ICD-10-CM | POA: Diagnosis not present

## 2017-08-23 DIAGNOSIS — I10 Essential (primary) hypertension: Secondary | ICD-10-CM | POA: Diagnosis not present

## 2017-08-23 DIAGNOSIS — I471 Supraventricular tachycardia: Secondary | ICD-10-CM

## 2017-08-23 DIAGNOSIS — E785 Hyperlipidemia, unspecified: Secondary | ICD-10-CM

## 2017-08-23 DIAGNOSIS — I050 Rheumatic mitral stenosis: Secondary | ICD-10-CM | POA: Insufficient documentation

## 2017-08-23 DIAGNOSIS — J9601 Acute respiratory failure with hypoxia: Secondary | ICD-10-CM

## 2017-08-23 MED ORDER — ISOSORBIDE MONONITRATE ER 30 MG PO TB24: 30.0000 mg | ORAL_TABLET | Freq: Every day | ORAL | 3 refills | Status: DC

## 2017-08-23 NOTE — Assessment & Plan Note (Addendum)
She still has some chest pain episodes, and there is no evidence of significant CAD.  Cannot exclude intermittent spasm - CP occurs @ rest. Willing to re-try Imdur - 30 min after ASA. Would like to try to avoid using amlodipine

## 2017-08-23 NOTE — Telephone Encounter (Signed)
Caller name:Jim PT w/ [facility] Relationship to patient: Can be reached: Pharmacy: Last visit:   Reason for call:FYI PT and OT not needed.

## 2017-08-23 NOTE — Patient Instructions (Addendum)
MEDICATION INSTRUCTIONS -- START TAKING AGAIN ISOSORBIDE MONO 30 MG TABLET  ,30 MINUTES AFTER TAKING DAILY 81 MG ASPIRIN.    Your physician wants you to follow-up in [place]. You will receive a reminder letter in the mail two months in advance. If you don't receive a letter, please call our office to schedule the follow-up appointment.   If you need a refill on your cardiac medications before your next appointment, please call your pharmacy.
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
## 2017-08-25 ENCOUNTER — Encounter: Payer: Self-pay | Admitting: Cardiology

## 2017-08-25 ENCOUNTER — Encounter: Payer: Self-pay | Admitting: Family Medicine

## 2017-08-25 NOTE — Assessment & Plan Note (Signed)
Overall currently asymptomatic.  Her edema is notably improved since starting Lasix.  She is on ARB and beta-blocker. We talked about monitoring her weight and take an additional dose of Lasix if necessary.

## 2017-08-25 NOTE — Assessment & Plan Note (Signed)
Need to review 2D echocardiogram with noninvasive colleagues in order to determine if this truly is mitral stenosis.  Unlikely that this would have developed in 8 months.  We will need to reassess an echocardiogram next year.

## 2017-08-25 NOTE — Assessment & Plan Note (Signed)
Blood pressure looks well-controlled today she is on stable dose of losartan and Lopressor.

## 2017-08-25 NOTE — Assessment & Plan Note (Signed)
These seem to be relatively stable on beta-blocker.  She has not had to take any additional doses.

## 2017-08-25 NOTE — Assessment & Plan Note (Signed)
Excellent lipid control based on most recent labs.  She is currently taking 80 mg atorvastatin which can probably be reduced at next follow-up

## 2017-08-25 NOTE — Assessment & Plan Note (Signed)
Recently hospitalized with respiratory failure and hypoxia. Overall doing better.  She had respiratory failure that was probably combination of COPD and some mild diastolic heart failure.  I suspect that she probably has some diastolic dysfunction that was exacerbated by pneumonia

## 2017-08-26 ENCOUNTER — Telehealth: Payer: Self-pay | Admitting: *Deleted

## 2017-08-26 NOTE — Telephone Encounter (Signed)
Received Physician Orders from [facility]; forwarded to provider/SLS 11/12  

## 2017-08-27 ENCOUNTER — Telehealth: Payer: Self-pay | Admitting: *Deleted

## 2017-08-27 NOTE — Telephone Encounter (Signed)
Received Home Health Certification and Plan of Care; forwarded to provider/SLS 11/13

## 2017-09-16 ENCOUNTER — Ambulatory Visit: Payer: Medicare Other | Admitting: Cardiology

## 2017-11-05 ENCOUNTER — Telehealth: Payer: Self-pay | Admitting: Family Medicine

## 2017-11-05 NOTE — Telephone Encounter (Signed)
Called pt regarding her myChart visit that Dr. Lorelei Pont will not be able to do cpe on tomorrow's visit. She will still be able to see her for an office visit. Please reschedule the cpe.

## 2017-11-06 ENCOUNTER — Ambulatory Visit: Payer: Medicare Other | Admitting: Family Medicine

## 2017-11-06 ENCOUNTER — Encounter: Payer: Self-pay | Admitting: Family Medicine

## 2017-11-06 VITALS — BP 152/80 | HR 73 | Temp 98.0°F | Ht 63.0 in | Wt 249.2 lb

## 2017-11-06 DIAGNOSIS — E1169 Type 2 diabetes mellitus with other specified complication: Secondary | ICD-10-CM | POA: Diagnosis not present

## 2017-11-06 DIAGNOSIS — E669 Obesity, unspecified: Secondary | ICD-10-CM | POA: Diagnosis not present

## 2017-11-06 DIAGNOSIS — I10 Essential (primary) hypertension: Secondary | ICD-10-CM

## 2017-11-06 DIAGNOSIS — R0602 Shortness of breath: Secondary | ICD-10-CM

## 2017-11-06 LAB — BRAIN NATRIURETIC PEPTIDE: Pro B Natriuretic peptide (BNP): 49 pg/mL (ref 0.0–100.0)

## 2017-11-06 LAB — HEMOGLOBIN A1C: Hgb A1c MFr Bld: 10 % — ABNORMAL HIGH (ref 4.6–6.5)

## 2017-11-06 MED ORDER — LOSARTAN POTASSIUM-HCTZ 50-12.5 MG PO TABS: 1.0000 | ORAL_TABLET | Freq: Every day | ORAL | 3 refills | Status: DC

## 2017-11-06 MED ORDER — INSULIN PEN NEEDLE 31G X 5 MM MISC: 3 refills | Status: DC

## 2017-11-06 NOTE — Patient Instructions (Signed)
I will check your labs today and be in touch asap Please contact the pulmonology office and schedule a visit to see them-  [phone] They do have providers who come [facility]  I changed your losartan to losartan with HCTZ
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
## 2017-11-07 LAB — COMPREHENSIVE METABOLIC PANEL
ALT: 19 U/L (ref 0–35)
AST: 22 U/L (ref 0–37)
Albumin: 4.4 g/dL (ref 3.5–5.2)
Alkaline Phosphatase: 129 U/L — ABNORMAL HIGH (ref 39–117)
BUN: 14 mg/dL (ref 6–23)
CO2: 28 mEq/L (ref 19–32)
Calcium: 9.6 mg/dL (ref 8.4–10.5)
Chloride: 103 mEq/L (ref 96–112)
Creatinine, Ser: 1.05 mg/dL (ref 0.40–1.20)
GFR: 67.9 mL/min (ref >60.00)
Glucose, Bld: 165 mg/dL — ABNORMAL HIGH (ref 70–99)
Potassium: 4.2 mEq/L (ref 3.5–5.1)
Sodium: 138 mEq/L (ref 135–145)
Total Bilirubin: 0.3 mg/dL (ref 0.2–1.2)
Total Protein: 8.4 g/dL — ABNORMAL HIGH (ref 6.0–8.3)

## 2017-11-10 ENCOUNTER — Encounter: Payer: Self-pay | Admitting: Family Medicine

## 2017-12-02 ENCOUNTER — Institutional Professional Consult (permissible substitution): Payer: Medicare Other | Admitting: Internal Medicine

## 2017-12-02 ENCOUNTER — Ambulatory Visit: Payer: Medicare Other | Admitting: Internal Medicine

## 2017-12-02 ENCOUNTER — Encounter: Payer: Self-pay | Admitting: Internal Medicine

## 2017-12-02 ENCOUNTER — Ambulatory Visit (INDEPENDENT_AMBULATORY_CARE_PROVIDER_SITE_OTHER)
Admission: RE | Admit: 2017-12-02 | Discharge: 2017-12-02 | Disposition: A | Discharge status: Home / Self Care | Payer: Medicare Other | Source: Ambulatory Visit | Attending: Internal Medicine | Admitting: Internal Medicine

## 2017-12-02 VITALS — BP 118/74 | HR 72 | Ht 63.0 in | Wt 243.0 lb

## 2017-12-02 DIAGNOSIS — R0609 Other forms of dyspnea: Secondary | ICD-10-CM | POA: Insufficient documentation

## 2017-12-02 DIAGNOSIS — R0600 Dyspnea, unspecified: Secondary | ICD-10-CM | POA: Insufficient documentation

## 2017-12-02 NOTE — Progress Notes (Signed)
Was able to talk to the patient regarding their results.  They verbalized an understanding of what was discussed. No further questions at this time. 

## 2017-12-02 NOTE — Progress Notes (Signed)
Subjective:     Patient ID: Lynn [name], female   DOB: July 04, 1956,   MRN: [id number]  HPI  29  yobf quit smoking 2016 with dm at wt around 220 then abruptly worsening sob aroudn 08/09/17 leading to admit   Admit date: 08/12/2017 Discharge date: 08/14/2017     Recommendations for Outpatient Follow-up:  Repeat BMET to follow electrolytes and renal function  Reassess CBG's and closely follow her diabetes, adjust hypoglycemic regimen as needed  Patient will need outpatient follow up with pulmonary service for PFT's and further treatment of underlying chronic obstructive lung disease. Reassess volume status and adjust medication regimen from heart failure stand point as needed (cardiology actively following patient for this)  Discharge Diagnoses:  Principal Problem:   Acute respiratory failure with hypoxia ([place])   Diabetes mellitus ([place])   Essential hypertension   Diastolic heart failure ([facility])   OSA on CPAP   COPD with acute bronchitis ([place]) morbid obesity   Discharge Condition: stable and improved. Patient discharge home with [facility]; instructed to follow up with PCP in 10 days and already with scheduled visit to see her cardiologist.  Diet recommendation: heart healthy diet, low carbohydrates and low calorie diet.    History of present illness:  62 yo female with DM, OSA on CPAP, HTN, HLD, diastolic CHF, presented to [facility] ED with main concern of several days duration of progressively worsening dyspnea at rest and with exertion. This was associated with mixed episodes on non productive and productive cough of yellow sputum, wheezing. Patient admitted for treatment of COPD exacerbation and acute on chronic diastolic HF.  Hospital Course:  1-acute resp failure with hypoxia: due to acute on chronic diastolic CHF and COPD exacerbation with bronchiectasis. -patient responded well to IV diuresis, solumedrol and nebulizer therapy -will discharge on tapering prednisone, advair,  doxycycline, lasix daily and continuation of her b-blocker and cozaar. -patient will need PFT's and follow up with pulmonary service as an outpatient -will also follow up with her cardiologist for further adjustment in her heart failure management  -advise to follow low sodium diet, to check daily weights and to maintain adequate hydration   -no CP, improved breathing and not need for oxygen supplementation. Also speaking in full sentences.  2-OSA -continue CPAP QHS  3-uncontrolled diabetes with hyperglycemia -most recent A1C 10.8 -will adjust her lantus dose; continue glipizide and victoza -close follow up as an outpatient and further adjustments base on CBG's fluctuation -anticipate increase in CBG with use of steroids, but with expected trending down after tapering.  4-HTN -stable and well controlled -will continue current antihypertensive regimen   5-hypokalemia -due to diuresis and nebulizer treatment most likely -repleted -discharge on daily maintenance dose of potassium  -check BMET at follow up visit   6-morbid obesity  -Body mass index is 42.49 kg/m. -low calorie diet, weight loss and exercise discussed with patient.  Procedures:  See below for x-ray reports   2-D echo - Left ventricle: The cavity size was normal. There was severe concentric hypertrophy. Systolic function was vigorous. The estimated ejection fraction was in the range of 65% to 70%. Wall motion was normal; there were no regional wall motion abnormalities. The study is not technically sufficient to allow evaluation of LV diastolic function. - Aortic valve: Sclerosis without stenosis. There was no regurgitation. - Mitral valve: Heavy posterior MAC. Moderate stenosis. Mildly thickened leaflets . There was mild regurgitation. Mean gradient (D): 9 mm Hg. Peak gradient (D): 20 mm Hg. Valve area by continuity  equation (using LVOT flow): 1.36 cm^2. - Left atrium: Moderately  dilated. - Right atrium: The atrium was mildly dilated. - Tricuspid valve: There was trivial regurgitation. - Pulmonary arteries: PA peak pressure: 27 mm Hg (S). - Systemic veins: The IVC measures <2.1 cm, but does not collapse >50%, suggesting an elevated RA pressure of 8 mmHg.  Impressions: - Compared to a prior study in 11/2016, the LVEF is higher. There is heavy MAC (mostly posterior) with moderate mitral stenosis (mean gradient 9 mmHG - MVA around 1.4 cm           12/02/2017 1st [facility] office visit/ [facility]   Chief Complaint  Patient presents with  . Pulmonary Consult    Referred by Dr. Janett Billow Copland.  Pt c/o SOB off and on since Novemeber 2018.  She states that she gets SOB with or without any exertion. She uses CPAP. She has occ cough with clear sputum.  She has a combivent inhaler that she rarely uses.   doe= MMRC3 = can't walk 100 yards even at a slow pace at a flat grade s stopping due to knee pain > sob  Has not tried combivent yet and not using advair though rec as above  Sleeps well on cpap  Cough is sporadic/ daytime s pattern or truly excess mucus  No obvious day to day or daytime variability or assoc  purulent sputum or mucus plugs or hemoptysis or cp or chest tightness, subjective wheeze or overt sinus or hb symptoms. No unusual exposure hx or h/o childhood pna/ asthma or knowledge of premature birth.  Sleeping ok on cpap  without nocturnal  or early am exacerbation  of respiratory  c/o's or need for noct saba. Also denies any obvious fluctuation of symptoms with weather or environmental changes or other aggravating or alleviating factors except as outlined above   Current Allergies, Complete Past Medical History, Past Surgical History, Family History, and Social History were reviewed in [employer] record.  ROS  The following are not active complaints unless bolded Hoarseness, sore throat, dysphagia, dental problems,  itching, sneezing,  nasal congestion or discharge of excess mucus or purulent secretions, ear ache,   fever, chills, sweats, unintended wt loss or wt gain, classically pleuritic or exertional cp,  orthopnea pnd or leg swelling, presyncope, palpitations, abdominal pain, anorexia, nausea, vomiting, diarrhea  or change in bowel habits or change in bladder habits, change in stools or change in urine, dysuria, hematuria,  rash, arthralgias, visual complaints, headache, numbness, weakness or ataxia or problems with walking or coordination,  change in mood/affect or memory.        Current Meds  Medication Sig  . aspirin 81 MG chewable tablet Chew 1 tablet (81 mg total) by mouth daily.  Marland Kitchen atorvastatin (LIPITOR) 80 MG tablet Take 1 tablet (80 mg total) by mouth daily at 6 PM.  . blood glucose meter kit and supplies KIT Use up to four times daily as directed.Dx: E11.9  . Blood Glucose Monitoring Suppl (ACCU-CHEK AVIVA PLUS) w/Device KIT Dispense 1 glucose testing kit . Use twice daily  . cetirizine (ZYRTEC) 10 MG tablet Take 10 mg by mouth daily as needed for allergies.  . CONTOUR NEXT TEST test strip USE UP TO FOUR TIMES DAILY AS DIRECTLY. DX H67.5  . folic acid (FOLVITE) 1 MG tablet Take 2 tablets (2 mg total) by mouth daily. (Patient taking differently: Take 1 mg by mouth 2 (two) times daily. )  . glipiZIDE (GLUCOTROL) 5  MG tablet Take 1 tablet (5 mg total) by mouth 2 (two) times daily before a meal.  . Insulin Glargine (LANTUS) 100 UNIT/ML Solostar Pen Inject 30 Units into the skin daily.  . Insulin Pen Needle (B-D UF III MINI PEN NEEDLES) 31G X 5 MM MISC Use daily with victoza and lantus  . Ipratropium-Albuterol (COMBIVENT RESPIMAT) 20-100 MCG/ACT AERS respimat Inhale 1 puff into the lungs every 6 (six) hours as needed for wheezing or shortness of breath.  . isosorbide mononitrate (IMDUR) 30 MG 24 hr tablet Take 1 tablet (30 mg total) daily by mouth. Take 30 minutes after taking your daily aspirin 81 mg.   . liraglutide (VICTOZA) 18 MG/3ML SOPN START WITH 0.6 MG INJECTED [place] DAILY FOR ONE WEEK, THEN INCREASE TO 1.2 MG DAILY  . losartan-hydrochlorothiazide (HYZAAR) 50-12.5 MG tablet Take 1 tablet by mouth daily.  . metoprolol tartrate (LOPRESSOR) 25 MG tablet TAKE 1 TABLET (25 MG TOTAL) BY MOUTH 2 (TWO) TIMES DAILY.  . Multiple Vitamin (MULTIVITAMIN WITH MINERALS) TABS tablet Take 1 tablet by mouth daily. Reported on 01/03/2016  . nitroGLYCERIN (NITROSTAT) 0.4 MG SL tablet PLACE 1 TABLET UNDER THE TONGUE EVERY 5MIN AS NEEDED FOR 3 DOSES FOR CHEST PAIN  . Oxymetazoline HCl (AFRIN 12 HOUR NA) Place 1 spray into both nostrils daily as needed (ALLERGIES/CONGESTION).  Marland Kitchen PRESCRIPTION MEDICATION Inhale into the lungs at bedtime. CPAP       Review of Systems     Objective:   Physical Exam    amb obese bf nad   Wt Readings from Last 3 Encounters:  12/02/17 243 lb (110.2 kg)  11/06/17 249 lb 3.2 oz (113 kg)  08/23/17 238 lb (108 kg)     Vital signs reviewed - Note on arrival 02 sats  98% on RA      HEENT: nl dentition, turbinates bilaterally, and oropharynx. Nl external ear canals without cough reflex   NECK :  without JVD/Nodes/TM/ nl carotid upstrokes bilaterally   LUNGS: no acc muscle use,  Nl contour chest which is clear to A and P bilaterally without cough on insp or exp maneuvers   CV:  RRR  no s3 or murmur or increase in P2, and no edema   ABD:  Quite obese but soft and nontender with limited inspiratory excursion in the supine position. No bruits or organomegaly appreciated, bowel sounds nl  MS:  Nl gait/ ext warm without deformities, calf tenderness, cyanosis or clubbing No obvious joint restrictions   SKIN: warm and dry without lesions    NEURO:  alert, approp, nl sensorium with  no motor or cerebellar deficits apparent.     CXR PA and Lateral:   12/02/2017 :    I personally reviewed images and agree with radiology impression as follows:    No active cardiopulmonary  disease.   Labs ordered/ reviewed:      Chemistry      Component Value Date/Time   NA 138 11/06/2017 1344   K 4.2 11/06/2017 1344   CL 103 11/06/2017 1344   CO2 28 11/06/2017 1344   BUN 14 11/06/2017 1344   CREATININE 1.05 11/06/2017 1344   CREATININE 0.95 01/03/2016 1118      Component Value Date/Time   CALCIUM 9.6 11/06/2017 1344   ALKPHOS 129 (H) 11/06/2017 1344   AST 22 11/06/2017 1344   ALT 19 11/06/2017 1344   BILITOT 0.3 11/06/2017 1344        Lab Results  Component Value Date  WBC 10.8 (H) 08/22/2017   HGB 10.1 (L) 08/22/2017   HCT 33.5 (L) 08/22/2017   MCV 60.7 Repeated and verified X2. (L) 08/22/2017   PLT 351.0 08/22/2017        Lab Results  Component Value Date   PROBNP 49.0 11/06/2017         Assessment:

## 2017-12-02 NOTE — Patient Instructions (Addendum)
combivent can be used up to 1 pff every 4 hours if needed   Ok to use combivent 15 minutes before an period of exertion like shopping  Please remember to go to the  x-ray department downstairs in the basement  for your tests - we will call you with the results when they are available.      Please schedule a follow up office visit in 6 weeks, call sooner if needed pfts on return

## 2017-12-03 ENCOUNTER — Encounter: Payer: Self-pay | Admitting: Internal Medicine

## 2017-12-03 DIAGNOSIS — E66813 Obesity, class 3: Secondary | ICD-10-CM | POA: Insufficient documentation

## 2017-12-03 NOTE — Assessment & Plan Note (Addendum)
Symptoms of sob at rest > limiting doe  (knees do that) are markedly disproportionate to objective findings and not clear this is actually much of a  lung problem but pt does appear to have difficult to sort out respiratory symptoms of unknown origin for which  DDX  = almost all start with A and  include Adherence, Ace Inhibitors, Acid Reflux, Active Sinus Disease, Alpha 1 Antitripsin deficiency, Anxiety masquerading as Airways dz,  ABPA,  Allergy(esp in young), Aspiration (esp in elderly), Adverse effects of meds,  Active smokers, A bunch of PE's (a small clot burden can't cause this syndrome unless there is already severe underlying pulm or vascular dz with poor reserve). Anemia and thyroid dz  plus two Bs  = Bronchiectasis and Beta blocker use..and one C= CHF    Adherence is always the initial "prime suspect" and is a multilayered concern that requires a "trust but verify" approach in every patient - starting with knowing how to use medications, especially inhalers, correctly, keeping up with refills and understanding the fundamental difference between maintenance and prns vs those medications only taken for a very short course and then stopped and not refilled.  - - The proper method of use, as well as anticipated side effects, of a metered-dose inhaler are discussed and demonstrated to the patient.   - advised to return with all meds in hand using a trust but verify approach to confirm accurate Medication  Reconciliation The principal here is that until we are certain that the  patients are doing what we've asked, it makes no sense to ask them to do more.   ? Asthma/ copd > for now just use combivent /zyrtecprn pending return for pfts  ? Adverse effects of meds > none identified   Anemia/ thryoid dz > appears to have significant microcytic anemia but carries dx of thallasemia > needs repeat cbc with fe studies and tsh on return if not done in meantime   ? Anxiety > usually at the bottom of this  list of usual suspects but is suggested by sob lasting just one breath or two at rest   ? Beta blocker effects > unlikely on such low doses of lopressor  ? CHF, esp occult Mitral stenosis > cards following    Total time devoted to counseling  > 50 % of initial 60 min office visit:  review case with pt/ discussion of options/alternatives/ personally creating written customized instructions  in presence of pt  then going over those specific  Instructions directly with the pt including how to use all of the meds but in particular covering each new medication in detail and the difference between the maintenance= "automatic" meds and the prns using an action plan format for the latter (If this problem/symptom => do that organization reading Left to right).  Please see AVS from this visit for a full list of these instructions which I personally wrote for this pt and  are unique to this visit.

## 2017-12-03 NOTE — Assessment & Plan Note (Signed)
Body mass index is 43.05 kg/m.  -  trending down slightly but a long way to go  Lab Results  Component Value Date   TSH 1.98 12/10/2016     Contributing to gerd risk/ doe/reviewed the need and the process to achieve and maintain neg calorie balance > defer f/u primary care including intermittently monitoring thyroid status

## 2017-12-09 ENCOUNTER — Other Ambulatory Visit: Payer: Self-pay | Admitting: Family Medicine

## 2017-12-09 DIAGNOSIS — Z139 Encounter for screening, unspecified: Secondary | ICD-10-CM

## 2017-12-10 ENCOUNTER — Other Ambulatory Visit: Payer: Self-pay | Admitting: *Deleted

## 2017-12-10 DIAGNOSIS — D508 Other iron deficiency anemias: Secondary | ICD-10-CM

## 2017-12-10 DIAGNOSIS — D563 Thalassemia minor: Secondary | ICD-10-CM

## 2017-12-10 MED ORDER — FOLIC ACID 1 MG PO TABS: 2.0000 mg | ORAL_TABLET | Freq: Every day | ORAL | 4 refills | Status: DC

## 2017-12-25 ENCOUNTER — Ambulatory Visit
Admission: RE | Admit: 2017-12-25 | Discharge: 2017-12-25 | Disposition: A | Discharge status: Home / Self Care | Payer: Medicare Other | Source: Ambulatory Visit | Attending: Family Medicine | Admitting: Family Medicine

## 2017-12-25 DIAGNOSIS — Z139 Encounter for screening, unspecified: Secondary | ICD-10-CM

## 2018-01-02 ENCOUNTER — Telehealth: Payer: Self-pay

## 2018-01-02 ENCOUNTER — Telehealth: Payer: Self-pay | Admitting: Family Medicine

## 2018-01-02 NOTE — Telephone Encounter (Signed)
Received forms- completed.

## 2018-01-02 NOTE — Telephone Encounter (Signed)
Received Tier exception request forms from [place] [employer] [place]- form completed and faxed to [phone]. Awaiting determination.

## 2018-01-02 NOTE — Telephone Encounter (Signed)
Received Tier exception request from [employer] [place] for pen needles? Forms completed and faxed to [phone]. Awaiting determination.

## 2018-01-02 NOTE — Telephone Encounter (Signed)
Received Tier exception request forms from [place] [employer] [place]. Forms completed at faxed to [phone]. Awaiting determination.

## 2018-01-02 NOTE — Telephone Encounter (Signed)
Copied from [place]. Topic: Quick Communication - Rx Refill/Question >> Jan 02, 2018 12:41 PM Scherrie Gerlach wrote: Medication: scott from [facility] calling to request additonal clinical information associated with 3 tier exception request. liraglutide (VICTOZA) 18 MG/3ML SOPN Insulin Glargine (LANTUS) 100 UNIT/ML Solostar Pen Insulin Pen Needle (B-D UF III MINI PEN NEEDLES) 31G X 5 MM MISC  Need clinical info for each of these request.  Include tried and failed, dx  and dx code, and clinical rational.  Nicki Reaper said would like you to call concerning this.(would be easlier)  ([employer] said faxwas sent Tuesday afternoon) Provider support number:  [phone]  option 5 Fax [phone]  cover sheet,  name DOB and reference to request .  So there will be 3 different pages for the 3 different meds  Please do one or the other, not both.

## 2018-01-03 NOTE — Telephone Encounter (Signed)
Tier exception request for Lantus denied.

## 2018-01-08 NOTE — Telephone Encounter (Signed)
Tier exception denied.

## 2018-01-14 ENCOUNTER — Encounter: Payer: Self-pay | Admitting: Internal Medicine

## 2018-01-14 ENCOUNTER — Ambulatory Visit (INDEPENDENT_AMBULATORY_CARE_PROVIDER_SITE_OTHER): Payer: Medicare Other | Admitting: Internal Medicine

## 2018-01-14 ENCOUNTER — Ambulatory Visit: Payer: Medicare Other | Admitting: Internal Medicine

## 2018-01-14 VITALS — BP 120/72 | HR 71 | Ht 62.5 in | Wt 249.0 lb

## 2018-01-14 DIAGNOSIS — R0609 Other forms of dyspnea: Secondary | ICD-10-CM | POA: Diagnosis not present

## 2018-01-14 DIAGNOSIS — R0600 Dyspnea, unspecified: Secondary | ICD-10-CM

## 2018-01-14 LAB — PULMONARY FUNCTION TEST
DL/VA % pred: 101 %
DL/VA: 4.67 ml/min/mmHg/L
DLCO unc % pred: 70 %
DLCO unc: 15.77 ml/min/mmHg
FEF 25-75 Post: 2.35 L/sec
FEF 25-75 Pre: 1.96 L/sec
FEF2575-%Change-Post: 19 %
FEF2575-%Pred-Post: 128 %
FEF2575-%Pred-Pre: 107 %
FEV1-%Change-Post: 3 %
FEV1-%Pred-Post: 87 %
FEV1-%Pred-Pre: 84 %
FEV1-Post: 1.62 L
FEV1-Pre: 1.57 L
FEV1FVC-%Change-Post: 2 %
FEV1FVC-%Pred-Pre: 107 %
FEV6-%Change-Post: 1 %
FEV6-%Pred-Post: 81 %
FEV6-%Pred-Pre: 80 %
FEV6-Post: 1.87 L
FEV6-Pre: 1.85 L
FEV6FVC-%Pred-Post: 104 %
FEV6FVC-%Pred-Pre: 104 %
FVC-%Change-Post: 1 %
FVC-%Pred-Post: 78 %
FVC-%Pred-Pre: 77 %
FVC-Post: 1.87 L
FVC-Pre: 1.85 L
Post FEV1/FVC ratio: 87 %
Post FEV6/FVC ratio: 100 %
Pre FEV1/FVC ratio: 85 %
Pre FEV6/FVC Ratio: 100 %
RV % pred: 81 %
RV: 1.61 L
TLC % pred: 75 %
TLC: 3.66 L

## 2018-01-14 NOTE — Assessment & Plan Note (Addendum)
ERV 58% on pfts 01/14/2018 cw body habitus  Body mass index is 44.82 kg/m.  -  trending up Lab Results  Component Value Date   TSH 1.98 12/10/2016     Contributing to gerd risk/ doe/reviewed the need and the process to achieve and maintain neg calorie balance > defer f/u primary care including intermittently monitoring thyroid status   Contemplating bariactric surgery > would be a good surgical candidate from pulmonary perspective and rec she consider seeing Dr Hassell Done to discuss risk/ benefits/alternatives

## 2018-01-14 NOTE — Progress Notes (Signed)
PFT done today. 

## 2018-01-14 NOTE — Patient Instructions (Signed)
You do not have any copd at all but could still have some mild asthma so use the combivent as needed    Pulmonary follow up is as needed

## 2018-01-14 NOTE — Progress Notes (Signed)
Subjective:     Patient ID: Delaila Nand, female   DOB: 1955-11-09,   MRN: [id number]  HPI  62  yobf quit smoking 2016 with dm at wt around 220 then abruptly worsening sob aroudn 08/09/17 leading to admit   Admit date: 08/12/2017 Discharge date: 08/14/2017   Recommendations for Outpatient Follow-up:  Repeat BMET to follow electrolytes and renal function  Reassess CBG's and closely follow her diabetes, adjust hypoglycemic regimen as needed  Patient will need outpatient follow up with pulmonary service for PFT's and further treatment of underlying chronic obstructive lung disease. Reassess volume status and adjust medication regimen from heart failure stand point as needed (cardiology actively following patient for this)  Discharge Diagnoses:  Principal Problem:   Acute respiratory failure with hypoxia ([place])   Diabetes mellitus ([place])   Essential hypertension   Diastolic heart failure ([facility])   OSA on CPAP   COPD with acute bronchitis ([place]) morbid obesity   Discharge Condition: stable and improved. Patient discharge home with [facility]; instructed to follow up with PCP in 10 days and already with scheduled visit to see her cardiologist.  Diet recommendation: heart healthy diet, low carbohydrates and low calorie diet.    History of present illness:  62 yo female with DM, OSA on CPAP, HTN, HLD, diastolic CHF, presented to [facility] ED with main concern of several days duration of progressively worsening dyspnea at rest and with exertion. This was associated with mixed episodes on non productive and productive cough of yellow sputum, wheezing. Patient admitted for treatment of COPD exacerbation and acute on chronic diastolic HF.  Hospital Course:  1-acute resp failure with hypoxia: due to acute on chronic diastolic CHF and COPD exacerbation with bronchiectasis. -patient responded well to IV diuresis, solumedrol and nebulizer therapy -will discharge on tapering prednisone, advair,  doxycycline, lasix daily and continuation of her b-blocker and cozaar. -patient will need PFT's and follow up with pulmonary service as an outpatient -will also follow up with her cardiologist for further adjustment in her heart failure management  -advise to follow low sodium diet, to check daily weights and to maintain adequate hydration   -no CP, improved breathing and not need for oxygen supplementation. Also speaking in full sentences.  2-OSA -continue CPAP QHS  3-uncontrolled diabetes with hyperglycemia -most recent A1C 10.8 -will adjust her lantus dose; continue glipizide and victoza -close follow up as an outpatient and further adjustments base on CBG's fluctuation -anticipate increase in CBG with use of steroids, but with expected trending down after tapering.  4-HTN -stable and well controlled -will continue current antihypertensive regimen   5-hypokalemia -due to diuresis and nebulizer treatment most likely -repleted -discharge on daily maintenance dose of potassium  -check BMET at follow up visit   6-morbid obesity  -Body mass index is 42.49 kg/m. -low calorie diet, weight loss and exercise discussed with patient.  Procedures:  See below for x-ray reports   2-D echo - Left ventricle: The cavity size was normal. There was severe concentric hypertrophy. Systolic function was vigorous. The estimated ejection fraction was in the range of 65% to 70%. Wall motion was normal; there were no regional wall motion abnormalities. The study is not technically sufficient to allow evaluation of LV diastolic function. - Aortic valve: Sclerosis without stenosis. There was no regurgitation. - Mitral valve: Heavy posterior MAC. Moderate stenosis. Mildly thickened leaflets . There was mild regurgitation. Mean gradient (D): 9 mm Hg. Peak gradient (D): 20 mm Hg. Valve area by continuity equation (using  LVOT flow): 1.36 cm^2. - Left atrium: Moderately  dilated. - Right atrium: The atrium was mildly dilated. - Tricuspid valve: There was trivial regurgitation. - Pulmonary arteries: PA peak pressure: 27 mm Hg (S). - Systemic veins: The IVC measures <2.1 cm, but does not collapse >50%, suggesting an elevated RA pressure of 8 mmHg.  Impressions: - Compared to a prior study in 11/2016, the LVEF is higher. There is heavy MAC (mostly posterior) with moderate mitral stenosis (mean gradient 9 mmHG - MVA around 1.4 cm      12/02/2017 1st [facility] office visit/ Renesha Lizama   Chief Complaint  Patient presents with  . Pulmonary Consult    Referred by Dr. Janett Billow Copland.  Pt c/o SOB off and on since Novemeber 2018.  She states that she gets SOB with or without any exertion. She uses CPAP. She has occ cough with clear sputum.  She has a combivent inhaler that she rarely uses.   doe= MMRC3 = can't walk 100 yards even at a slow pace at a flat grade s stopping due to knee pain > sob  Has not tried combivent yet and not using advair though rec as above  Sleeps well on cpap  Cough is sporadic/ daytime s pattern or truly excess mucus rec combivent can be used up to 1 pff every 4 hours if needed  Ok to use combivent 15 minutes before an period of exertion like shopping   01/14/2018  f/u ov/Rian Koon re:   Sob/ no meds/ pfts wnl x erv 58%  Chief Complaint  Patient presents with  . Follow-up    PFT done today, breathing ok, still some SOB when walking   Dyspnea:  Still MMRC3 but more limited by knees Cough: none Sleep: ok on cpap SABA use:  Not using at all   No obvious day to day or daytime variability or assoc excess/ purulent sputum or mucus plugs or hemoptysis or cp or chest tightness, subjective wheeze or overt sinus or hb symptoms. No unusual exposure hx or h/o childhood pna/ asthma or knowledge of premature birth.  Sleeping ok flat without nocturnal  or early am exacerbation  of respiratory  c/o's or need for noct saba. Also denies any  obvious fluctuation of symptoms with weather or environmental changes or other aggravating or alleviating factors except as outlined above   Current Allergies, Complete Past Medical History, Past Surgical History, Family History, and Social History were reviewed in [employer] record.  ROS  The following are not active complaints unless bolded Hoarseness, sore throat, dysphagia, dental problems, itching, sneezing,  nasal congestion or discharge of excess mucus or purulent secretions, ear ache,   fever, chills, sweats, unintended wt loss or wt gain, classically pleuritic or exertional cp,  orthopnea pnd or leg swelling, presyncope, palpitations, abdominal pain, anorexia, nausea, vomiting, diarrhea  or change in bowel habits or change in bladder habits, change in stools or change in urine, dysuria, hematuria,  rash, arthralgias, visual complaints, headache, numbness, weakness or ataxia or problems with walking or coordination,  change in mood/affect or memory.        Current Meds  Medication Sig  . aspirin 81 MG chewable tablet Chew 1 tablet (81 mg total) by mouth daily.  Marland Kitchen atorvastatin (LIPITOR) 80 MG tablet Take 1 tablet (80 mg total) by mouth daily at 6 PM.  . blood glucose meter kit and supplies KIT Use up to four times daily as directed.Dx: E11.9  . Blood Glucose Monitoring  Suppl (ACCU-CHEK AVIVA PLUS) w/Device KIT Dispense 1 glucose testing kit . Use twice daily  . cetirizine (ZYRTEC) 10 MG tablet Take 10 mg by mouth daily as needed for allergies.  . CONTOUR NEXT TEST test strip USE UP TO FOUR TIMES DAILY AS DIRECTLY. DX S92.3  . folic acid (FOLVITE) 1 MG tablet Take 2 tablets (2 mg total) by mouth daily.  Marland Kitchen glipiZIDE (GLUCOTROL) 5 MG tablet Take 1 tablet (5 mg total) by mouth 2 (two) times daily before a meal.  . Insulin Glargine (LANTUS) 100 UNIT/ML Solostar Pen Inject 30 Units into the skin daily.  . Insulin Pen Needle (B-D UF III MINI PEN NEEDLES) 31G X 5 MM MISC Use  daily with victoza and lantus  . Ipratropium-Albuterol (COMBIVENT RESPIMAT) 20-100 MCG/ACT AERS respimat Inhale 1 puff into the lungs every 6 (six) hours as needed for wheezing or shortness of breath.  . liraglutide (VICTOZA) 18 MG/3ML SOPN START WITH 0.6 MG INJECTED [place] DAILY FOR ONE WEEK, THEN INCREASE TO 1.2 MG DAILY  . losartan-hydrochlorothiazide (HYZAAR) 50-12.5 MG tablet Take 1 tablet by mouth daily.  . metoprolol tartrate (LOPRESSOR) 25 MG tablet TAKE 1 TABLET (25 MG TOTAL) BY MOUTH 2 (TWO) TIMES DAILY.  . Multiple Vitamin (MULTIVITAMIN WITH MINERALS) TABS tablet Take 1 tablet by mouth daily. Reported on 01/03/2016  . nitroGLYCERIN (NITROSTAT) 0.4 MG SL tablet PLACE 1 TABLET UNDER THE TONGUE EVERY 5MIN AS NEEDED FOR 3 DOSES FOR CHEST PAIN  . Oxymetazoline HCl (AFRIN 12 HOUR NA) Place 1 spray into both nostrils daily as needed (ALLERGIES/CONGESTION).  Marland Kitchen PRESCRIPTION MEDICATION Inhale into the lungs at bedtime. CPAP                     Objective:   Physical Exam   amb pleasant obese wf nad    01/14/2018        249   12/02/17 243 lb (110.2 kg)  11/06/17 249 lb 3.2 oz (113 kg)  08/23/17 238 lb (108 kg)      Vital signs reviewed - Note on arrival 02 sats  98% on RA     HEENT: nl dentition, turbinates bilaterally, and oropharynx. Nl external ear canals without cough reflex   NECK :  without JVD/Nodes/TM/ nl carotid upstrokes bilaterally   LUNGS: no acc muscle use,  Nl contour chest which is clear to A and P bilaterally without cough on insp or exp maneuvers   CV:  RRR  no s3 or murmur or increase in P2, and no edema   ABD:  Obes/ soft and nontender with limited inspiratory excursion in the supine position. No bruits or organomegaly appreciated, bowel sounds nl  MS:  Nl gait/ ext warm without deformities, calf tenderness, cyanosis or clubbing No obvious joint restrictions   SKIN: warm and dry without lesions    NEURO:  alert, approp, nl sensorium with  no motor or  cerebellar deficits apparent.               Assessment:

## 2018-01-14 NOTE — Assessment & Plan Note (Signed)
PFT's  01/14/2018  wnl x for ERV 58% c/w body habitus    I had an extended final summary discussion with the patient reviewing all relevant studies completed to date and  lasting 15 to 20 minutes of a 25 minute visit on the following issues:    1) no evidence at all to support dx of copd here  2) could still have asthma and should keep the combivent on hand prn  Advised: If your breathing worsens or you need to use your rescue inhaler more than twice weekly or wake up more than twice a month with any respiratory symptoms or require more than two rescue inhalers per year, we need to see you right away because this means we're not controlling the underlying problem (inflammation) adequately.  Rescue inhalers (albuterol) do not control inflammation and overuse can lead to unnecessary and costly consequences.  They can make you feel better temporarily but eventually they will quit working effectively much as sleep aids lead to more insomnia if used regularly.    3) main problem is MO (see separate a/p)   Pulmonary f/u is prn    Each maintenance medication was reviewed in detail including most importantly the difference between maintenance and as needed and under what circumstances the prns are to be used.  Please see AVS for specific  Instructions which are unique to this visit and I personally typed out  which were reviewed in detail in writing with the patient and a copy provided.

## 2018-01-15 ENCOUNTER — Encounter: Payer: Self-pay | Admitting: Family Medicine

## 2018-01-15 DIAGNOSIS — E1169 Type 2 diabetes mellitus with other specified complication: Secondary | ICD-10-CM

## 2018-01-15 DIAGNOSIS — E669 Obesity, unspecified: Principal | ICD-10-CM

## 2018-01-15 MED ORDER — LIRAGLUTIDE 18 MG/3ML ~~LOC~~ SOPN: 1.2000 mg | PEN_INJECTOR | Freq: Every day | SUBCUTANEOUS | 2 refills | Status: DC

## 2018-02-19 ENCOUNTER — Ambulatory Visit: Payer: Self-pay | Admitting: Cardiology

## 2018-03-19 ENCOUNTER — Encounter: Payer: Self-pay | Admitting: Internal Medicine

## 2018-03-19 NOTE — Telephone Encounter (Signed)
See last avs: You could still have asthma and should keep the combivent on hand prn and  If your breathing worsens or you need to use your rescue inhaler more than twice weekly or wake up more than twice a month with any respiratory symptoms or require more than two rescue inhalers per year, we need to see you right away because this means we're not controlling the underlying problem (inflammation) adequately.  So Clearly needs ov and not a neb called in  so see if we can get her in this week to see NP or me if space available or if not call in symbicort 80 2bid x one only and ov before any refills   - this is a maint rx and combivent is prn

## 2018-03-19 NOTE — Telephone Encounter (Signed)
Dr. Wert please advise.  Thanks! 

## 2018-03-20 ENCOUNTER — Ambulatory Visit: Payer: Medicare HMO | Admitting: Cardiology

## 2018-03-20 ENCOUNTER — Encounter: Payer: Self-pay | Admitting: Cardiology

## 2018-03-20 VITALS — BP 102/64 | HR 77 | Ht 62.5 in | Wt 246.0 lb

## 2018-03-20 DIAGNOSIS — E785 Hyperlipidemia, unspecified: Secondary | ICD-10-CM | POA: Diagnosis not present

## 2018-03-20 DIAGNOSIS — I2510 Atherosclerotic heart disease of native coronary artery without angina pectoris: Secondary | ICD-10-CM

## 2018-03-20 DIAGNOSIS — Z6841 Body Mass Index (BMI) 40.0 and over, adult: Secondary | ICD-10-CM | POA: Diagnosis not present

## 2018-03-20 DIAGNOSIS — R0609 Other forms of dyspnea: Secondary | ICD-10-CM | POA: Diagnosis not present

## 2018-03-20 DIAGNOSIS — I471 Supraventricular tachycardia: Secondary | ICD-10-CM

## 2018-03-20 DIAGNOSIS — I5032 Chronic diastolic (congestive) heart failure: Secondary | ICD-10-CM | POA: Diagnosis not present

## 2018-03-20 DIAGNOSIS — R0600 Dyspnea, unspecified: Secondary | ICD-10-CM

## 2018-03-20 DIAGNOSIS — I342 Nonrheumatic mitral (valve) stenosis: Secondary | ICD-10-CM

## 2018-03-20 MED ORDER — FUROSEMIDE 20 MG PO TABS: ORAL_TABLET | ORAL | 0 refills | Status: DC

## 2018-03-20 MED ORDER — FUROSEMIDE 20 MG PO TABS: 20.0000 mg | ORAL_TABLET | Freq: Every day | ORAL | 3 refills | Status: DC

## 2018-03-20 NOTE — Patient Instructions (Signed)
Medication instructions Take 40 mg lasix ( 2 tablet) daily until Sunday ( last day) , then take 20 mg daily   Recommend  Taking deep breaths, ans stretching techniques for fast heartrate as well - vagal maneuvers  Recommendations for vagal maneuvers:  "Bearing down"  Coughing  Gagging  Icy, cold towel on face or drink ice cold water    If you decide on weight loss surgery , give office a call for clearance.   Your physician wants you to follow-up in 6 months with DR HARDING. You will receive a reminder letter in the mail two months in advance. If you don't receive a letter, please call our office to schedule the follow-up appointment.   If you need a refill on your cardiac medications before your next appointment, please call your pharmacy.

## 2018-03-20 NOTE — Progress Notes (Signed)
PCP: [name], MD  Clinic Note: Chief Complaint  Patient presents with  . Follow-up    6 months  . Headache    All the time.  . Shortness of Breath  . Chest Pain    Every now and then.  . Edema    Legs, feet, and ankles.    HPI: Lynn [name] is a 62 y.o. female with a PMH below who presents today for six-month follow-up FOR mild diastolic heart failure and presumed coronary spasm.  Nonobstructive CAD by cath following a demand ischemia infarct in February 2018.  She does have moderate mitral stenosis by echo.  Lynn [name] was last seen in November 2018 for hospitalization follow-up feeling essentially back to baseline occasional chest discomfort but nothing prolonged.  Recent Hospitalizations:   None  Studies Personally Reviewed - (if available, images/films reviewed: From Epic Chart or Care Everywhere)  None  Interval History: Lynn [name] returns here today doing fairly well.  She has only been taking her Lasix as needed and most of her swelling is picking up.  She does not really have any PND orthopnea.  Just some exertional dyspnea.  She has these off and on spells of chest discomfort worse with deep inspiration in the ninth associated with exertion.  Sometimes associated with stress. He was told that she may have asthma and that may do so her shortness of breath on her own.  She does not have any resting shortness of breath but does have exertional shortness of breath no associated pain.  She still has palpitations that can last anywhere from 5 to 10 minutes. The spells feel like she is having a panic attack but there is nothing going on to make her feel that way.  Usually, she is able to break with Valsalva maneuvers like we discussed.  She has not had any syncope or near syncope associated with them.  No TIA or amaurosis fugax.  No claudication.  ROS: A comprehensive was performed. Review of Systems  Constitutional: Positive for malaise/fatigue (Energy is  improving but not back to baseline). Negative for chills and fever.  HENT: Negative for congestion and nosebleeds.   Respiratory: Positive for shortness of breath (Stable). Negative for cough.   Cardiovascular: Negative for claudication and PND.  Gastrointestinal: Negative for abdominal pain, blood in stool, heartburn and melena.  Musculoskeletal: Positive for back pain and joint pain.  Neurological: Positive for dizziness (Positional).   I have reviewed and (if needed) personally updated the patient's problem list, medications, allergies, past medical and surgical history, social and family history.   Past Medical History:  Diagnosis Date  . Anginal pain ([place])   . Anxiety   . Arthritis    "knees, wrists, back, elbows" (07/03/2017)  . Clotting disorder ([place])    blood clot in eye 2016  . Coronary artery disease, non-occlusive    a. 11/2016 NSTEMI/Cath: LM nl, LAD 40p, 51m, D1/2 small, LCX 40ost, OM2/3 nl/small, RCA 35 ost/mid, RPDA/RPL small/nl.  . Depression   . Diastolic dysfunction    a. 11/2016 Echo: EF 55-60%, gr1 DD, sev calcified MV annulus, mildly dil LA.  .Marland KitchenEye hemorrhage 01/2015   "right; resolved" (07/03/2017)  . Headache    "monthly" (07/03/2017)  . Heart murmur   . History of blood transfusion    "when I had an ectopic pregnancy"  . History of stomach ulcers   . Hyperlipidemia   . Hypertension   . Microcytic anemia   . OSA on CPAP  setting is unknown  . Pneumonia    "several times" (07/03/2017)  . Syncope 07/03/2017 X 2   called seizures but no medications  . Thalassemia    "my cells are sickle cell shaped but I don't have sickle cell anemia" (07/03/2017)  . Type II diabetes mellitus ([place])     Past Surgical History:  Procedure Laterality Date  . 48-Hour Monitor  03/18/2017   Sinus rhythm with sinus tachycardia (rate 58-134 BPM)multiple PVCs noted with couplets and bigeminy. One triplet. 7 runs of PAT ranging from 100-130 bpm. Longest was 33 beats.  Marland Kitchen BREAST  BIOPSY Left   . CARDIAC CATHETERIZATION N/A 11/19/2016   Procedure: Left Heart Cath and Coronary Angiography;  Surgeon: Belva Crome, MD;  Location: [facility];  Service: Cardiovascular.    LM nl, LAD 40p, 42m, D1/2 small, LCX 40ost, OM2/3 nl/small, RCA 35 ost/mid, RPDA/RPL small/nl.  . CARPAL TUNNEL RELEASE Right 11/01/2014  . COLONOSCOPY    . DILATION AND CURETTAGE OF UTERUS    . ECTOPIC PREGNANCY SURGERY  X 2  . JOINT REPLACEMENT    . KNEE ARTHROSCOPY Left 11/2014  . TONSILLECTOMY    . TOTAL KNEE ARTHROPLASTY Right 02/18/2015   Procedure: RIGHT TOTAL KNEE ARTHROPLASTY;  Surgeon: SNetta Cedars MD;  [facility]  Service: Orthopedics;  Laterality: Right;  . TOTAL KNEE ARTHROPLASTY Left 08/19/2015   Procedure: LEFT TOTAL KNEE ARTHROPLASTY;  Surgeon: SNetta Cedars MD;  [facility]  Service: Orthopedics;  Laterality: Left;  . TRANSTHORACIC ECHOCARDIOGRAM  11/2016; 07/2017:   a) normal LV size, thickness and function. EF 55-60%. GR 1 DD.No RWMA severe mitral annular calcification, but no mitral stenosis. Mild LA dilation;; b) normal LV size and function.  EF 65-75%.  Unable to assess diastolic function.  Aortic sclerosis with no stenosis.  Moderate mitral stenosis? (Gradient 9 mmHg) -?  Mild-mod left atrial enlargement   . VAGINAL HYSTERECTOMY     Diagnostic Coronary Angiogram February 2018      Current Meds  Medication Sig  . aspirin 81 MG chewable tablet Chew 1 tablet (81 mg total) by mouth daily.  .Marland Kitchenatorvastatin (LIPITOR) 80 MG tablet Take 1 tablet (80 mg total) by mouth daily at 6 PM.  . blood glucose meter kit and supplies KIT Use up to four times daily as directed.Dx: E11.9  . Blood Glucose Monitoring Suppl (ACCU-CHEK AVIVA PLUS) w/Device KIT Dispense 1 glucose testing kit . Use twice daily  . cetirizine (ZYRTEC) 10 MG tablet Take 10 mg by mouth daily as needed for allergies.  . CONTOUR NEXT TEST test strip USE UP TO FOUR TIMES DAILY AS DIRECTLY. DX EZ60.1 . folic acid  (FOLVITE) 1 MG tablet Take 2 tablets (2 mg total) by mouth daily.  .Marland KitchenglipiZIDE (GLUCOTROL) 5 MG tablet Take 1 tablet (5 mg total) by mouth 2 (two) times daily before a meal.  . Insulin Glargine (LANTUS) 100 UNIT/ML Solostar Pen Inject 30 Units into the skin daily.  . Insulin Pen Needle (B-D UF III MINI PEN NEEDLES) 31G X 5 MM MISC Use daily with victoza and lantus  . Ipratropium-Albuterol (COMBIVENT RESPIMAT) 20-100 MCG/ACT AERS respimat Inhale 1 puff into the lungs every 6 (six) hours as needed for wheezing or shortness of breath.  . liraglutide (VICTOZA) 18 MG/3ML SOPN Inject 0.2 mLs (1.2 mg total) into the skin daily.  .Marland Kitchenlosartan-hydrochlorothiazide (HYZAAR) 50-12.5 MG tablet Take 1 tablet by mouth daily.  . metoprolol tartrate (LOPRESSOR) 25 MG tablet  TAKE 1 TABLET (25 MG TOTAL) BY MOUTH 2 (TWO) TIMES DAILY.  . Multiple Vitamin (MULTIVITAMIN WITH MINERALS) TABS tablet Take 1 tablet by mouth daily. Reported on 01/03/2016  . nitroGLYCERIN (NITROSTAT) 0.4 MG SL tablet PLACE 1 TABLET UNDER THE TONGUE EVERY 5MIN AS NEEDED FOR 3 DOSES FOR CHEST PAIN  . Oxymetazoline HCl (AFRIN 12 HOUR NA) Place 1 spray into both nostrils daily as needed (ALLERGIES/CONGESTION).  Marland Kitchen PRESCRIPTION MEDICATION Inhale into the lungs at bedtime. CPAP   Not listed, but taking furosemide as needed  Allergies  Allergen Reactions  . Penicillins Hives    Childhood allergy Has patient had a PCN reaction causing immediate rash, facial/tongue/throat swelling, SOB or lightheadedness with hypotension: Yes Has patient had a PCN reaction causing severe rash involving mucus membranes or skin necrosis: No Has patient had a PCN reaction that required hospitalization No Has patient had a PCN reaction occurring within the last 10 years: No If all of the above answers are "NO", then may proceed with Cephalosporin use.  . Metformin And Related     Diarrhea, bleeding    Social History   Tobacco Use  . Smoking status: Former Smoker     Packs/day: 0.25    Years: 44.00    Pack years: 11.00    Types: Cigarettes    Last attempt to quit: 02/17/2015    Years since quitting: 3.0  . Smokeless tobacco: Never Used  Substance Use Topics  . Alcohol use: Yes    Alcohol/week: 0.0 oz    Comment: 07/03/2017 "might have a few drinks/year"  . Drug use: No   Social History   Social History Narrative  . Not on file    family history includes Cancer in her father and mother; Diabetes in her maternal grandfather, maternal grandmother, mother, and sister; Hyperlipidemia in her father and mother; Hypertension in her mother; Mental illness in her sister.  Wt Readings from Last 3 Encounters:  03/20/18 246 lb (111.6 kg)  01/14/18 249 lb (112.9 kg)  12/02/17 243 lb (110.2 kg)    PHYSICAL EXAM BP 102/64 (BP Location: Left Arm, Patient Position: Sitting, Cuff Size: Large)   Pulse 77   Ht 5' 2.5" (1.588 m)   Wt 246 lb (111.6 kg)   BMI 44.28 kg/m  Physical Exam  Constitutional: She is oriented to person, place, and time. She appears well-developed and well-nourished. No distress.  Morbidly obese.  Well-groomed  HENT:  Head: Normocephalic and atraumatic.  Neck: No JVD (Very hard to distinguish from respiratory infection.  ) present.  Pulmonary/Chest: Effort normal and breath sounds normal. No respiratory distress. She has no wheezes. She has no rales.  Abdominal: Soft. Bowel sounds are normal. She exhibits no distension. There is no tenderness. There is no rebound.  Truncal obesity  Musculoskeletal: Normal range of motion. She exhibits no edema.  Neurological: She is alert and oriented to person, place, and time.  Psychiatric: She has a normal mood and affect. Her behavior is normal. Judgment and thought content normal.  Vitals reviewed.   Adult ECG Report  Rate: 77 ;  Rhythm: normal sinus rhythm and Cannot rule out anterior MI, age undetermined.  Otherwise normal axis, intervals and durations;   Narrative Interpretation: Stable  EKG   Other studies Reviewed: Additional studies/ records that were reviewed today include:  Recent Labs: Last labs from June 12, 2017  Total cholesterol 99, HDL 38, LDL 41, triglycerides 91.  A1c 10 with a random glucose 165.  BUN/creatinine 14/1.05.  TSH 1.98.   ASSESSMENT / PLAN: Problem List Items Addressed This Visit    PAT (paroxysmal atrial tachycardia) ([place]) - Primary (Chronic)    She notes off and on episodes of palpitations are usually worse with anxiety.  Not overly long-lasting, but are associated with shortness of breath.  They usually able to be stopped with Valsalva maneuvers.  Continue to coach on Valsalva maneuvers.  Also okay to use PRN additional dose of beta-blocker beyond the standard 25 twice daily.      Relevant Medications   furosemide (LASIX) 20 MG tablet   furosemide (LASIX) 20 MG tablet   Other Relevant Orders   EKG 12-Lead (Completed)   Morbid obesity due to excess calories ([facility]) (Chronic)    The patient understands the need to lose weight with diet and exercise. We have discussed specific strategies for this. Needs to lose weight.  Needs to find some way to do exercise.      Mitral stenosis    Due for f/u Echo in Oct-Nov 2019 --> may need to consider referral to CVTS if Mod-Severe or worse.      Relevant Medications   furosemide (LASIX) 20 MG tablet   furosemide (LASIX) 20 MG tablet   Hyperlipidemia with target low density lipoprotein (LDL) cholesterol less than 100 mg/dL (Chronic)    Well-controlled lipids based on labs back in August.  Continue statin -however may need to consider reducing dose if she has starts of myalgias.  Follow-up with PCP.       Relevant Medications   furosemide (LASIX) 20 MG tablet   furosemide (LASIX) 20 MG tablet   Dyspnea on exertion (Chronic)    Mostly related to obesity and deconditioning.  We discussed importance of dietary modification.  Would not consider ischemic evaluation at this time given relatively recent  cardiac catheterization..   Last echo showed possible moderate mitral stenosis -- will need f/u Echo this year (prior to 6 month f/u)  Apparently, she was told that she may have some asthma? -Defer to PCP and pulmonary      Relevant Orders   EKG 12-Lead (Completed)   Diastolic heart failure ([facility]) (Chronic)    Mild  volume overloaded today.  She has pretty much only been using her furosemide on an as-needed basis. Take standing dose of 40 mg Lasix for 2 days and reduce to 20 mg daily of the standing dose. Continue beta-blocker and ARB.  Her blood pressure looks pretty well controlled would not titrate.      Relevant Medications   furosemide (LASIX) 20 MG tablet   furosemide (LASIX) 20 MG tablet   Other Relevant Orders   EKG 12-Lead (Completed)   Coronary artery disease, non-occlusive (Chronic)    Still noting episodes of chest pain which could potentially be spasm related, but more likely.  Doing better on Imdur so we will continue for now.      Relevant Medications   furosemide (LASIX) 20 MG tablet   furosemide (LASIX) 20 MG tablet      Current medicines are reviewed at length with the patient today.  (+/- concerns) n/a The following changes have been made:  see below  Patient Instructions  Medication instructions Take 40 mg lasix ( 2 tablet) daily until Sunday ( last day) , then take 20 mg daily   Recommend  Taking deep breaths, ans stretching techniques for fast heartrate as well - vagal maneuvers  Recommendations for vagal maneuvers:  "Bearing down"  Coughing  Gagging  Icy, cold towel on face or drink ice cold water    If you decide on weight loss surgery , give office a call for clearance.   Your physician wants you to follow-up in 6 months with DR HARDING. You will receive a reminder letter in the mail two months in advance. If you don't receive a letter, please call our office to schedule the follow-up appointment.   If you need a refill on your cardiac  medications before your next appointment, please call your pharmacy.    Studies Ordered:   Orders Placed This Encounter  Procedures  . EKG 12-Lead      Glenetta Hew, M.D., M.S. Interventional Cardiologist   Pager # [phone] Phone # [phone] [address] [place], [place] [address]   Thank you for choosing [facility] at [facility]!!

## 2018-03-22 ENCOUNTER — Encounter: Payer: Self-pay | Admitting: Internal Medicine

## 2018-03-23 ENCOUNTER — Encounter: Payer: Self-pay | Admitting: Cardiology

## 2018-03-23 NOTE — Assessment & Plan Note (Signed)
She notes off and on episodes of palpitations are usually worse with anxiety.  Not overly long-lasting, but are associated with shortness of breath.  They usually able to be stopped with Valsalva maneuvers.  Continue to coach on Valsalva maneuvers.  Also okay to use PRN additional dose of beta-blocker beyond the standard 25 twice daily.

## 2018-03-23 NOTE — Assessment & Plan Note (Signed)
The patient understands the need to lose weight with diet and exercise. We have discussed specific strategies for this. Needs to lose weight.  Needs to find some way to do exercise.

## 2018-03-23 NOTE — Assessment & Plan Note (Signed)
Well-controlled lipids based on labs back in August.  Continue statin -however may need to consider reducing dose if she has starts of myalgias.  Follow-up with PCP.

## 2018-03-23 NOTE — Assessment & Plan Note (Signed)
Due for f/u Echo in Oct-Nov 2019 --> may need to consider referral to CVTS if Mod-Severe or worse.

## 2018-03-23 NOTE — Assessment & Plan Note (Signed)
Still noting episodes of chest pain which could potentially be spasm related, but more likely.  Doing better on Imdur so we will continue for now.

## 2018-03-23 NOTE — Assessment & Plan Note (Signed)
Mild  volume overloaded today.  She has pretty much only been using her furosemide on an as-needed basis. Take standing dose of 40 mg Lasix for 2 days and reduce to 20 mg daily of the standing dose. Continue beta-blocker and ARB.  Her blood pressure looks pretty well controlled would not titrate.

## 2018-03-23 NOTE — Assessment & Plan Note (Addendum)
Mostly related to obesity and deconditioning.  We discussed importance of dietary modification.  Would not consider ischemic evaluation at this time given relatively recent cardiac catheterization..   Last echo showed possible moderate mitral stenosis -- will need f/u Echo this year (prior to 6 month f/u)  Apparently, she was told that she may have some asthma? -Defer to PCP and pulmonary

## 2018-03-24 NOTE — Telephone Encounter (Signed)
Already addressed this 03/19/18 starting with my last instructions    "You could still have asthma and should keep the combivent on hand prn and  If your breathing worsens or you need to use your rescue inhaler more than twice weekly or wake up more than twice a month with any respiratory symptoms or require more than two rescue inhalers per year, we need to see you right away because this means we're not controlling the underlying problem (inflammation) adequately."  So she  Clearly needs ov and not a neb called in  -  see if we can get her in this week to see NP   if not then ok to call in  in symbicort 80 2bid x one only and ov before any refills   - this is a maint rx and combivent is prn

## 2018-03-24 NOTE — Telephone Encounter (Signed)
From: [name]  Sent: 03/22/2018 10:55 AM EDT  To: Christinia Gully, MD Subject: Non-Urgent Medical Question  Hi Dr Melvyn Novas  I came in to see you a few months ago for my testing of my lungs. I past with flying colors. But testing showed that I was a little asthmatic. I have an inhaler ( Combivent ) and I have been using it more an more.  The weather has changed, as well as all the allergy's from grass, trees. When the air get thick, I'm finding it hard to breath as well as doing any activity. I also feel some discomfort when I inhale. Is there any home treatment that I use at home. Also my inhaler script is about to run out. It needs to be up dated and filled. Is there any way that I can get a script for a nebulizer machine with treatment. When I was in the hospital that helped.   That for any suggestions or help  Dr. Melvyn Novas, please advise. She is ok with waiting until you are back in the office. Thanks!

## 2018-03-24 NOTE — Telephone Encounter (Signed)
Attempted to call patient to schedule OV with NP per Dr. Melvyn Novas, no answer, message left to call back.

## 2018-03-25 ENCOUNTER — Other Ambulatory Visit: Payer: Self-pay | Admitting: Hematology & Oncology

## 2018-04-15 ENCOUNTER — Other Ambulatory Visit: Payer: Self-pay | Admitting: Cardiology

## 2018-04-18 ENCOUNTER — Other Ambulatory Visit: Payer: Self-pay | Admitting: Cardiology
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
## 2018-05-05 ENCOUNTER — Encounter: Payer: Self-pay | Admitting: Family Medicine

## 2018-05-05 ENCOUNTER — Ambulatory Visit (INDEPENDENT_AMBULATORY_CARE_PROVIDER_SITE_OTHER): Payer: Medicare HMO | Admitting: Family Medicine

## 2018-05-05 VITALS — BP 128/84 | HR 64 | Temp 98.4°F | Resp 16 | Ht 62.5 in | Wt 248.1 lb

## 2018-05-05 DIAGNOSIS — E669 Obesity, unspecified: Secondary | ICD-10-CM | POA: Diagnosis not present

## 2018-05-05 DIAGNOSIS — I10 Essential (primary) hypertension: Secondary | ICD-10-CM

## 2018-05-05 DIAGNOSIS — Z5181 Encounter for therapeutic drug level monitoring: Secondary | ICD-10-CM

## 2018-05-05 DIAGNOSIS — J449 Chronic obstructive pulmonary disease, unspecified: Secondary | ICD-10-CM | POA: Diagnosis not present

## 2018-05-05 DIAGNOSIS — E785 Hyperlipidemia, unspecified: Secondary | ICD-10-CM

## 2018-05-05 DIAGNOSIS — E1169 Type 2 diabetes mellitus with other specified complication: Secondary | ICD-10-CM | POA: Diagnosis not present

## 2018-05-05 DIAGNOSIS — G4733 Obstructive sleep apnea (adult) (pediatric): Secondary | ICD-10-CM

## 2018-05-05 DIAGNOSIS — Z794 Long term (current) use of insulin: Secondary | ICD-10-CM | POA: Diagnosis not present

## 2018-05-05 DIAGNOSIS — I214 Non-ST elevation (NSTEMI) myocardial infarction: Secondary | ICD-10-CM

## 2018-05-05 DIAGNOSIS — Z9989 Dependence on other enabling machines and devices: Secondary | ICD-10-CM

## 2018-05-05 LAB — CBC
HCT: 32.9 % — ABNORMAL LOW (ref 36.0–46.0)
Hemoglobin: 10.2 g/dL — ABNORMAL LOW (ref 12.0–15.0)
MCHC: 30.9 g/dL (ref 30.0–36.0)
MCV: 59.3 fl — ABNORMAL LOW (ref 78.0–100.0)
Platelets: 307 10*3/uL (ref 150.0–400.0)
RBC: 5.55 Mil/uL — ABNORMAL HIGH (ref 3.87–5.11)
RDW: 17.6 % — ABNORMAL HIGH (ref 11.5–15.5)
WBC: 9 10*3/uL (ref 4.0–10.5)

## 2018-05-05 LAB — LIPID PANEL
Cholesterol: 111 mg/dL (ref 0–200)
HDL: 46.1 mg/dL (ref >39.00)
LDL Cholesterol: 47 mg/dL (ref 0–99)
NonHDL: 65.26
Total CHOL/HDL Ratio: 2
Triglycerides: 89 mg/dL (ref 0.0–149.0)
VLDL: 17.8 mg/dL (ref 0.0–40.0)

## 2018-05-05 LAB — COMPREHENSIVE METABOLIC PANEL
ALT: 24 U/L (ref 0–35)
AST: 14 U/L (ref 0–37)
Albumin: 4.6 g/dL (ref 3.5–5.2)
Alkaline Phosphatase: 118 U/L — ABNORMAL HIGH (ref 39–117)
BUN: 14 mg/dL (ref 6–23)
CO2: 28 mEq/L (ref 19–32)
Calcium: 10 mg/dL (ref 8.4–10.5)
Chloride: 100 mEq/L (ref 96–112)
Creatinine, Ser: 1.11 mg/dL (ref 0.40–1.20)
GFR: 63.58 mL/min (ref >60.00)
Glucose, Bld: 141 mg/dL — ABNORMAL HIGH (ref 70–99)
Potassium: 4.1 mEq/L (ref 3.5–5.1)
Sodium: 138 mEq/L (ref 135–145)
Total Bilirubin: 0.4 mg/dL (ref 0.2–1.2)
Total Protein: 8.2 g/dL (ref 6.0–8.3)

## 2018-05-05 LAB — MICROALBUMIN / CREATININE URINE RATIO
Creatinine,U: 67.2 mg/dL
Microalb Creat Ratio: 1 mg/g (ref 0.0–30.0)
Microalb, Ur: <0.7 mg/dL (ref 0.0–1.9)

## 2018-05-05 LAB — HEMOGLOBIN A1C: Hgb A1c MFr Bld: 9.5 % — ABNORMAL HIGH (ref 4.6–6.5)

## 2018-05-05 MED ORDER — LIRAGLUTIDE 18 MG/3ML ~~LOC~~ SOPN: 1.8000 mg | PEN_INJECTOR | Freq: Every day | SUBCUTANEOUS | 11 refills | Status: DC

## 2018-05-05 MED ORDER — LOSARTAN POTASSIUM-HCTZ 50-12.5 MG PO TABS: 1.0000 | ORAL_TABLET | Freq: Every day | ORAL | 3 refills | Status: DC

## 2018-05-05 MED ORDER — ATORVASTATIN CALCIUM 80 MG PO TABS: 80.0000 mg | ORAL_TABLET | Freq: Every day | ORAL | 3 refills | Status: DC

## 2018-05-05 NOTE — Patient Instructions (Addendum)
Great to see you again today- take care and I will be in touch with your labs asap We did refills for you today as well Please come and see me in about 6 months assuming that your labs look ok   Best of luck with getting your house finished!

## 2018-05-09 ENCOUNTER — Encounter: Payer: Self-pay | Admitting: Family Medicine

## 2018-07-14 DIAGNOSIS — E119 Type 2 diabetes mellitus without complications: Secondary | ICD-10-CM | POA: Diagnosis not present

## 2018-07-14 DIAGNOSIS — H2513 Age-related nuclear cataract, bilateral: Secondary | ICD-10-CM | POA: Diagnosis not present

## 2018-08-02 NOTE — Progress Notes (Addendum)
[facility] [place], [place], [place] [address] [phone]  Date:  08/04/2018   Name:  Lynn [name]   DOB:  [birth year]   MRN:  [id number]  PCP:  [name], MD    Chief Complaint: Shoulder Pain (right shoulder, from elbow to shoulder blade, no known injury) and Hip Pain (lower back raidating to right hip pain, 2 years)   History of Present Illness:  Lynn [name] is a 62 y.o. very pleasant female patient who presents with the following:  Last seen by myself in July for a DM check Her A1c was above goal- at that time she wished to try and make some diet changes prior to changing any medications  Will check this for her today and make adjustment in her meds if still too high  Flu shot needed - pt reports that she had this done at drug store   Lab Results  Component Value Date   HGBA1C 9.5 (H) 05/05/2018  from our last visit-  Her glucose has looked ok; she notes that "I got it under control"  Her morning glucose may be about 120, she has not gotten above 200  She did up her victoza 1.8 daily  Also increased her lantus to 45 units  She is on glipizide but ran out - she does not think that this makes much of a difference in the big scheme of things so we will DC at this time  She is seeing a psychologist for her anxiety and this is helping her ///////////////////////////// Your blood counts are baseline for you- mildly abnormal due to your thalassemia as you know Metabolic profile is normal Cholesterol looks quite doos No protein in your urine Your A1c is still too high although it is better. However, from your reports your glucose is coming under better control at home.  If you like, why don't we give it 3 months and see where we are. If you are not under 8% however at next check we will need to modify your medications.   Please come and see me in 3 MONTHS instead of 6 like we have said before.  Take  care and continue to work hard on your blood sugars   She did get a recall letter for her losartan/hctz- I will call pharmacy for her to see what we need to do here   She has right shoulder pain from the shoulder to the elbow. This has bothered her for 2 years, waxing and waning.   Hurts to make certain movements such as doing her hair and playing guitar. She is at the point of wanting to do something about this.   She has difficulty fully abducting or flexing her shoulder.   No acute injury noted   She has right hip pain as well - this has been the case since 2016. Again no acute injury.  Seemed to start after her knees were replaced  She had both knees replaced in 2016- they are pretty good but she still does have some pain  Knees done by Dr. Veverly Fells. She would like to see him again for her shoulder.  However, he does not operate on hips per my knowledge so she may need to see one of his partners for this other issue   No fever or chills No CP or SOB   Asa 81 lipitor 80 lantus combivent victoza 1.8 hyzaar 50/12.5 lopressor  Patient Active  Problem List   Diagnosis Date Noted  . Morbid obesity due to excess calories ([place]) 12/03/2017  . Dyspnea on exertion 12/02/2017  . Mitral stenosis 08/23/2017  . Diastolic heart failure ([place]) 08/12/2017  . Acute respiratory failure with hypoxia ([place]) 08/12/2017  . OSA on CPAP 08/12/2017  . COPD with acute bronchitis ([place]) 08/12/2017  . Near syncope 07/03/2017  . Anxiety 07/03/2017  . Uncontrolled diabetes mellitus ([place]) 07/03/2017  . Coronary artery disease, non-occlusive 12/08/2016  . PAT (paroxysmal atrial tachycardia) ([place])   . Demand myocardial infarction ([place]) 11/16/2016  . H/O total knee replacement 08/19/2015  . Vision, loss, sudden 03/07/2015  . S/P TKR (total knee replacement) using cement 02/18/2015  . Thalassemia 09/02/2013  . Hyperlipidemia with target low density lipoprotein (LDL) cholesterol less than 100 mg/dL 08/24/2012  .  Diabetes mellitus ([place]) 07/22/2012  . Essential hypertension 07/22/2012    Past Medical History:  Diagnosis Date  . Anginal pain ([place])   . Anxiety   . Arthritis    "knees, wrists, back, elbows" (07/03/2017)  . Clotting disorder ([place])    blood clot in eye 2016  . Coronary artery disease, non-occlusive    a. 11/2016 NSTEMI/Cath: LM nl, LAD 40p, 40m, D1/2 small, LCX 40ost, OM2/3 nl/small, RCA 35 ost/mid, RPDA/RPL small/nl.  . Depression   . Diastolic dysfunction    a. 11/2016 Echo: EF 55-60%, gr1 DD, sev calcified MV annulus, mildly dil LA.  .[name] KitchenEye hemorrhage 01/2015   "right; resolved" (07/03/2017)  . Headache    "monthly" (07/03/2017)  . Heart murmur   . History of blood transfusion    "when I had an ectopic pregnancy"  . History of stomach ulcers   . Hyperlipidemia   . Hypertension   . Microcytic anemia   . OSA on CPAP    setting is unknown  . Pneumonia    "several times" (07/03/2017)  . Syncope 07/03/2017 X 2   called seizures but no medications  . Thalassemia    "my cells are sickle cell shaped but I don't have sickle cell anemia" (07/03/2017)  . Type II diabetes mellitus ([place]     Past Surgical History:  Procedure Laterality Date  . 48-Hour Monitor  03/18/2017   Sinus rhythm with sinus tachycardia (rate 58-134 BPM)multiple PVCs noted with couplets and bigeminy. One triplet. 7 runs of PAT ranging from 100-130 bpm. Longest was 33 beats.  .[name] KitchenBREAST BIOPSY Left   . CARDIAC CATHETERIZATION N/A 11/19/2016   Procedure: Left Heart Cath and Coronary Angiography;  Surgeon: HBelva Crome MD;  [facility];  Service: Cardiovascular.    LM nl, LAD 40p, 527m D1/2 small, LCX 40ost, OM2/3 nl/small, RCA 35 ost/mid, RPDA/RPL small/nl.  . CARPAL TUNNEL RELEASE Right 11/01/2014  . COLONOSCOPY    . DILATION AND CURETTAGE OF UTERUS    . ECTOPIC PREGNANCY SURGERY  X 2  . JOINT REPLACEMENT    . KNEE ARTHROSCOPY Left 11/2014  . TONSILLECTOMY    . TOTAL KNEE ARTHROPLASTY Right  02/18/2015   Procedure: RIGHT TOTAL KNEE ARTHROPLASTY;  Surgeon: StNetta CedarsMD;  [facility] Service: Orthopedics;  Laterality: Right;  . TOTAL KNEE ARTHROPLASTY Left 08/19/2015   Procedure: LEFT TOTAL KNEE ARTHROPLASTY;  Surgeon: StNetta CedarsMD;  [facility] Service: Orthopedics;  Laterality: Left;  . TRANSTHORACIC ECHOCARDIOGRAM  11/2016; 07/2017:   a) normal LV size, thickness and function. EF 55-60%. GR 1 DD.No RWMA severe mitral annular calcification, but no mitral  stenosis. Mild LA dilation;; b) normal LV size and function.  EF 65-75%.  Unable to assess diastolic function.  Aortic sclerosis with no stenosis.  Moderate mitral stenosis? (Gradient 9 mmHg) -?  Mild-mod left atrial enlargement   . VAGINAL HYSTERECTOMY      Social History   Tobacco Use  . Smoking status: Former Smoker    Packs/day: 0.25    Years: 44.00    Pack years: 11.00    Types: Cigarettes    Last attempt to quit: 02/17/2015    Years since quitting: 3.4  . Smokeless tobacco: Never Used  Substance Use Topics  . Alcohol use: Yes    Alcohol/week: 0.0 standard drinks    Comment: 07/03/2017 "might have a few drinks/year"  . Drug use: No    Family History  Problem Relation Age of Onset  . Cancer Mother   . Diabetes Mother   . Hypertension Mother   . Hyperlipidemia Mother   . Cancer Father   . Hyperlipidemia Father   . Mental illness Sister   . Diabetes Sister   . Diabetes Maternal Grandmother   . Diabetes Maternal Grandfather   . Colon cancer Neg Hx   . Esophageal cancer Neg Hx   . Stomach cancer Neg Hx   . Rectal cancer Neg Hx     Allergies  Allergen Reactions  . Penicillins Hives    Childhood allergy Has patient had a PCN reaction causing immediate rash, facial/tongue/throat swelling, SOB or lightheadedness with hypotension: Yes Has patient had a PCN reaction causing severe rash involving mucus membranes or skin necrosis: No Has patient had a PCN reaction that required hospitalization  No Has patient had a PCN reaction occurring within the last 10 years: No If all of the above answers are "NO", then may proceed with Cephalosporin use.  . Metformin And Related     Diarrhea, bleeding    Medication list has been reviewed and updated.  Current Outpatient Medications on File Prior to Visit  Medication Sig Dispense Refill  . aspirin 81 MG chewable tablet Chew 1 tablet (81 mg total) by mouth daily. 30 tablet   . atorvastatin (LIPITOR) 80 MG tablet Take 1 tablet (80 mg total) by mouth daily at 6 PM. 90 tablet 3  . blood glucose meter kit and supplies KIT Use up to four times daily as directed.Dx: E11.9 1 each 0  . Blood Glucose Monitoring Suppl (ACCU-CHEK AVIVA PLUS) w/Device KIT Dispense 1 glucose testing kit . Use twice daily 1 kit 0  . cetirizine (ZYRTEC) 10 MG tablet Take 10 mg by mouth daily as needed for allergies.    . CONTOUR NEXT TEST test strip USE UP TO FOUR TIMES DAILY AS DIRECTLY. DX E11.9 400 each 3  . Insulin Glargine (LANTUS) 100 UNIT/ML Solostar Pen Inject 30 Units into the skin daily. (Patient taking differently: Inject 45 Units into the skin daily. )    . Insulin Pen Needle (B-D UF III MINI PEN NEEDLES) 31G X 5 MM MISC Use daily with victoza and lantus 200 each 3  . Ipratropium-Albuterol (COMBIVENT RESPIMAT) 20-100 MCG/ACT AERS respimat Inhale 1 puff into the lungs every 6 (six) hours as needed for wheezing or shortness of breath. 1 Inhaler 3  . liraglutide (VICTOZA) 18 MG/3ML SOPN Inject 0.3 mLs (1.8 mg total) into the skin daily. 15 mL 11  . losartan-hydrochlorothiazide (HYZAAR) 50-12.5 MG tablet Take 1 tablet by mouth daily. 90 tablet 3  . metoprolol tartrate (LOPRESSOR) 25 MG tablet TAKE  1 TABLET (25 MG TOTAL) BY MOUTH 2 (TWO) TIMES DAILY. 60 tablet 6  . Multiple Vitamin (MULTIVITAMIN WITH MINERALS) TABS tablet Take 1 tablet by mouth daily. Reported on 01/03/2016    . PRESCRIPTION MEDICATION Inhale into the lungs at bedtime. CPAP     No current  facility-administered medications on file prior to visit.     Review of Systems:  As per HPI- otherwise negative. Otherwise feeling well  Physical Examination: Vitals:   08/04/18 1325  BP: 128/80  Pulse: 70  Resp: 18  Temp: 98.4 F (36.9 C)  SpO2: 98%   Vitals:   08/04/18 1325  Weight: 247 lb (112 kg)  Height: 5' 2.5" (1.588 m)   Body mass index is 44.46 kg/m. Ideal Body Weight: Weight in (lb) to have BMI = 25: 138.6  GEN: WDWN, NAD, Non-toxic, A & O x 3, obese, looks well  HEENT: Atraumatic, Normocephalic. Neck supple. No masses, No LAD. Ears and Nose: No external deformity. CV: RRR, No M/G/R. No JVD. No thrill. No extra heart sounds. PULM: CTA B, no wheezes, crackles, rhonchi. No retractions. No resp. distress. No accessory muscle use. EXTR: No c/c/e NEURO Normal gait.  PSYCH: Normally interactive. Conversant. Not depressed or anxious appearing.  Calm demeanor.  Right shoulder: flexion and abduction to approx 150 degrees then she has pain.  External rotation is preserved but internal rotation is much reduced and painful Tender over RCT insertion at humerus. Negative empty can Right hip:  Tenderness at groin with flexion/ flexed abduction and adduction  Dg Hip Unilat W Or W/o Pelvis 2-3 Views Right  Result Date: 08/04/2018 CLINICAL DATA:  Chronic right hip pain EXAM: DG HIP (WITH OR WITHOUT PELVIS) 2-3V RIGHT COMPARISON:  None. FINDINGS: Bones of the pelvis appear normal. There is mild osteoarthritis of both sacroiliac joints. Hip joint spaces show perhaps minimal narrowing on the left. Right joint space appears normal. No hip joint or lower pelvic pathology is seen. IMPRESSION: No evidence of right hip abnormality. Question minimal cartilage loss of the left hip. Sacroiliac osteoarthritis of a mild degree. Electronically Signed   By: Nelson Chimes M.D.   On: 08/04/2018 14:24   Message to pt re: hip films  They don't see any severe joint space loss/ arthritis. So I don't  think you need anything like a hip replacement.  However, as this has been painful for some time please mention to Dr. Veverly Fells. He may be able to look at this himself or may need to have you see one of his partners.   Assessment and Plan: Chronic right shoulder pain - Plan: Ambulatory referral to Orthopedic Surgery  Diabetes mellitus type 2 in obese ([place]) - Plan: Hemoglobin A1c  Essential hypertension  Needs flu shot  Right hip pain - Plan: DG HIP UNILAT W OR W/O PELVIS 2-3 VIEWS RIGHT  BP well controlled on current medications Check A1c today- if not better will make med adjustment Referral back to Dr. Veverly Fells for right shoulder and she will mention her long standing hip sx as well  Will plan further follow- up pending labs.  Signed Lamar Blinks, MD  10/24- have received her labs and made a few attempts to call her.  Will send mychart message to her at this time regarding her diabetes   She cannot take metformin Is on lantus and victoza Would suggest adding an SGLT-2 at this point

## 2018-08-04 ENCOUNTER — Ambulatory Visit (INDEPENDENT_AMBULATORY_CARE_PROVIDER_SITE_OTHER): Payer: Medicare HMO | Admitting: Family Medicine

## 2018-08-04 ENCOUNTER — Encounter: Payer: Self-pay | Admitting: Family Medicine

## 2018-08-04 ENCOUNTER — Ambulatory Visit (HOSPITAL_BASED_OUTPATIENT_CLINIC_OR_DEPARTMENT_OTHER)
Admission: RE | Admit: 2018-08-04 | Discharge: 2018-08-04 | Disposition: A | Discharge status: Home / Self Care | Payer: Medicare HMO | Source: Ambulatory Visit | Attending: Family Medicine | Admitting: Family Medicine

## 2018-08-04 VITALS — BP 128/80 | HR 70 | Temp 98.4°F | Resp 18 | Ht 62.5 in | Wt 247.0 lb

## 2018-08-04 DIAGNOSIS — G8929 Other chronic pain: Secondary | ICD-10-CM

## 2018-08-04 DIAGNOSIS — E669 Obesity, unspecified: Secondary | ICD-10-CM | POA: Diagnosis not present

## 2018-08-04 DIAGNOSIS — Z23 Encounter for immunization: Secondary | ICD-10-CM

## 2018-08-04 DIAGNOSIS — M25551 Pain in right hip: Secondary | ICD-10-CM | POA: Insufficient documentation

## 2018-08-04 DIAGNOSIS — I10 Essential (primary) hypertension: Secondary | ICD-10-CM | POA: Diagnosis not present

## 2018-08-04 DIAGNOSIS — M47898 Other spondylosis, sacral and sacrococcygeal region: Secondary | ICD-10-CM | POA: Diagnosis not present

## 2018-08-04 DIAGNOSIS — M25511 Pain in right shoulder: Secondary | ICD-10-CM | POA: Diagnosis not present

## 2018-08-04 DIAGNOSIS — E1169 Type 2 diabetes mellitus with other specified complication: Secondary | ICD-10-CM

## 2018-08-04 DIAGNOSIS — M1611 Unilateral primary osteoarthritis, right hip: Secondary | ICD-10-CM | POA: Diagnosis not present

## 2018-08-04 LAB — HEMOGLOBIN A1C: Hgb A1c MFr Bld: 10 % — ABNORMAL HIGH (ref 4.6–6.5)

## 2018-08-04 NOTE — Patient Instructions (Addendum)
Good to see you as always!  I will send you back to see Dr. Veverly Fells for your right shoulder issue. Films of your right hip today- I will be in touch with this report. If you need any surgery here one of Dr. Veverly Fells' partners can help you out!   I called the pharmacy and your particular losartan/hctz BP pill is not affected by the recall and is ok to continue taking

## 2018-08-07 ENCOUNTER — Encounter: Payer: Self-pay | Admitting: Family Medicine

## 2018-08-07 DIAGNOSIS — E669 Obesity, unspecified: Principal | ICD-10-CM

## 2018-08-07 DIAGNOSIS — E1169 Type 2 diabetes mellitus with other specified complication: Secondary | ICD-10-CM

## 2018-08-11 MED ORDER — DAPAGLIFLOZIN PROPANEDIOL 5 MG PO TABS: 5.0000 mg | ORAL_TABLET | Freq: Every day | ORAL | 6 refills | Status: DC

## 2018-08-11 NOTE — Addendum Note (Signed)
Addended by: Lamar Blinks C on: 08/11/2018 09:08 AM   Modules accepted: Orders

## 2018-08-12 ENCOUNTER — Encounter: Payer: Self-pay | Admitting: Family Medicine

## 2018-08-12 MED ORDER — FREESTYLE LIBRE 14 DAY READER DEVI: 1.0000 | 0 refills | Status: DC

## 2018-08-12 MED ORDER — FREESTYLE LIBRE 14 DAY SENSOR MISC: 2.0000 | 5 refills | Status: DC

## 2018-08-19 DIAGNOSIS — Z471 Aftercare following joint replacement surgery: Secondary | ICD-10-CM | POA: Diagnosis not present

## 2018-08-19 DIAGNOSIS — M25511 Pain in right shoulder: Secondary | ICD-10-CM | POA: Diagnosis not present

## 2018-08-19 DIAGNOSIS — Z96653 Presence of artificial knee joint, bilateral: Secondary | ICD-10-CM | POA: Diagnosis not present

## 2018-08-29 ENCOUNTER — Other Ambulatory Visit: Payer: Self-pay | Admitting: Family Medicine

## 2018-09-02 DIAGNOSIS — M25511 Pain in right shoulder: Secondary | ICD-10-CM | POA: Diagnosis not present

## 2018-09-05 DIAGNOSIS — M25511 Pain in right shoulder: Secondary | ICD-10-CM | POA: Diagnosis not present

## 2018-09-09 DIAGNOSIS — M25511 Pain in right shoulder: Secondary | ICD-10-CM | POA: Diagnosis not present

## 2018-09-15 ENCOUNTER — Encounter: Payer: Self-pay | Admitting: Family Medicine

## 2018-10-28 NOTE — Progress Notes (Addendum)
[place] at [facility] [address] [place], [place] [address] [phone]  Date:  10/29/2018   Name:  Lynn [name]   DOB:  [birth year]   MRN:  [id number]  PCP:  [name], MD    Chief Complaint: Diabetes (a1c check-lab work, stopped taking farxiga-due to side effects diarrhea)   History of Present Illness:  Lynn [name] is a 63 y.o. very pleasant female patient who presents with the following:  History of obesity, diabetes, CAD with MI history, COPD, sleep apnea, hyperlipidemia, hypertension, thalassemia. She had an NSTEMI in 2018, and underwent stenting.  She has had several knee operations as well  Former smoker with about an 11-pack-year history Most recently seen by myself in October for shoulder pain At her last A1c check, she was still above goal despite attempting lifestyle changes.  She is not able to tolerate metformin, and was taking Lantus and Victoza We added for farxiga at that time, and will check her A1c today  Lab Results  Component Value Date   HGBA1C 10.0 (H) 08/04/2018   Wt Readings from Last 3 Encounters:  10/29/18 235 lb (106.6 kg)  08/04/18 247 lb (112 kg)  05/05/18 248 lb 2 oz (112.5 kg)   She has lost 10 lbs!  This is great news However she stopped taking the farxiga, because it seemed to cause diarrhea. She changed her diet, and also moved her victoza from her abd to her arm; she wonders if she is absorbing the medication better this way  She is using the [employer] which allows her to see how foods affect her sugar and real-time.  She has adjusted her diet accordingly She is using some intermittent fasting as well  Patient and her husband are working on weight loss  She has noticed some glucose readings as low as the mid 80s, but has not had any symptoms of hypoglycemia   Patient Active Problem List   Diagnosis Date Noted  . Morbid obesity due to excess calories ([place])  12/03/2017  . Dyspnea on exertion 12/02/2017  . Mitral stenosis 08/23/2017  . Diastolic heart failure ([place]) 08/12/2017  . Acute respiratory failure with hypoxia ([place]) 08/12/2017  . OSA on CPAP 08/12/2017  . COPD with acute bronchitis ([place]) 08/12/2017  . Near syncope 07/03/2017  . Anxiety 07/03/2017  . Uncontrolled diabetes mellitus ([place]) 07/03/2017  . Coronary artery disease, non-occlusive 12/08/2016  . PAT (paroxysmal atrial tachycardia) ([place])   . Demand myocardial infarction ([place]) 11/16/2016  . H/O total knee replacement 08/19/2015  . Vision, loss, sudden 03/07/2015  . S/P TKR (total knee replacement) using cement 02/18/2015  . Thalassemia 09/02/2013  . Hyperlipidemia with target low density lipoprotein (LDL) cholesterol less than 100 mg/dL 08/24/2012  . Diabetes mellitus ([place]) 07/22/2012  . Essential hypertension 07/22/2012    Past Medical History:  Diagnosis Date  . Anginal pain ([place])   . Anxiety   . Arthritis    "knees, wrists, back, elbows" (07/03/2017)  . Clotting disorder ([place])    blood clot in eye 2016  . Coronary artery disease, non-occlusive    a. 11/2016 NSTEMI/Cath: LM nl, LAD 40p, 58md, D1/2 small, LCX 40ost, OM2/3 nl/small, RCA 35 ost/mid, RPDA/RPL small/nl.  . Depression   . Diastolic dysfunction    a. 11/2016 Echo: EF 55-60%, gr1 DD, sev calcified MV annulus, mildly dil LA.  Marland Kitchen Eye hemorrhage 01/2015   "right; resolved" (07/03/2017)  . Headache    "  monthly" (07/03/2017)  . Heart murmur   . History of blood transfusion    "when I had an ectopic pregnancy"  . History of stomach ulcers   . Hyperlipidemia   . Hypertension   . Microcytic anemia   . OSA on CPAP    setting is unknown  . Pneumonia    "several times" (07/03/2017)  . Syncope 07/03/2017 X 2   called seizures but no medications  . Thalassemia    "my cells are sickle cell shaped but I don't have sickle cell anemia" (07/03/2017)  . Type II diabetes mellitus ([place])     Past Surgical History:   Procedure Laterality Date  . 48-Hour Monitor  03/18/2017   Sinus rhythm with sinus tachycardia (rate 58-134 BPM)multiple PVCs noted with couplets and bigeminy. One triplet. 7 runs of PAT ranging from 100-130 bpm. Longest was 33 beats.  Marland Kitchen BREAST BIOPSY Left   . CARDIAC CATHETERIZATION N/A 11/19/2016   Procedure: Left Heart Cath and Coronary Angiography;  Surgeon: Belva Crome, MD;  Location: [facility];  Service: Cardiovascular.    LM nl, LAD 40p, 6md, D1/2 small, LCX 40ost, OM2/3 nl/small, RCA 35 ost/mid, RPDA/RPL small/nl.  . CARPAL TUNNEL RELEASE Right 11/01/2014  . COLONOSCOPY    . DILATION AND CURETTAGE OF UTERUS    . ECTOPIC PREGNANCY SURGERY  X 2  . JOINT REPLACEMENT    . KNEE ARTHROSCOPY Left 11/2014  . TONSILLECTOMY    . TOTAL KNEE ARTHROPLASTY Right 02/18/2015   Procedure: RIGHT TOTAL KNEE ARTHROPLASTY;  Surgeon: Netta Cedars, MD;  Location: [facility];  Service: Orthopedics;  Laterality: Right;  . TOTAL KNEE ARTHROPLASTY Left 08/19/2015   Procedure: LEFT TOTAL KNEE ARTHROPLASTY;  Surgeon: Netta Cedars, MD;  Location: [facility];  Service: Orthopedics;  Laterality: Left;  . TRANSTHORACIC ECHOCARDIOGRAM  11/2016; 07/2017:   a) normal LV size, thickness and function. EF 55-60%. GR 1 DD.No RWMA severe mitral annular calcification, but no mitral stenosis. Mild LA dilation;; b) normal LV size and function.  EF 65-75%.  Unable to assess diastolic function.  Aortic sclerosis with no stenosis.  Moderate mitral stenosis? (Gradient 9 mmHg) -?  Mild-mod left atrial enlargement   . VAGINAL HYSTERECTOMY      Social History   Tobacco Use  . Smoking status: Former Smoker    Packs/day: 0.25    Years: 44.00    Pack years: 11.00    Types: Cigarettes    Last attempt to quit: 02/17/2015    Years since quitting: 3.6  . Smokeless tobacco: Never Used  Substance Use Topics  . Alcohol use: Yes    Alcohol/week: 0.0 standard drinks    Comment: 07/03/2017 "might have a few drinks/year"  . Drug use: No     Family History  Problem Relation Age of Onset  . Cancer Mother   . Diabetes Mother   . Hypertension Mother   . Hyperlipidemia Mother   . Cancer Father   . Hyperlipidemia Father   . Mental illness Sister   . Diabetes Sister   . Diabetes Maternal Grandmother   . Diabetes Maternal Grandfather   . Colon cancer Neg Hx   . Esophageal cancer Neg Hx   . Stomach cancer Neg Hx   . Rectal cancer Neg Hx     Allergies  Allergen Reactions  . Penicillins Hives    Childhood allergy Has patient had a PCN reaction causing immediate rash, facial/tongue/throat swelling, SOB or lightheadedness with hypotension: Yes Has patient had  a PCN reaction causing severe rash involving mucus membranes or skin necrosis: No Has patient had a PCN reaction that required hospitalization No Has patient had a PCN reaction occurring within the last 10 years: No If all of the above answers are "NO", then may proceed with Cephalosporin use.  . Metformin And Related     Diarrhea, bleeding    Medication list has been reviewed and updated.  Current Outpatient Medications on File Prior to Visit  Medication Sig Dispense Refill  . aspirin 81 MG chewable tablet Chew 1 tablet (81 mg total) by mouth daily. 30 tablet   . atorvastatin (LIPITOR) 80 MG tablet Take 1 tablet (80 mg total) by mouth daily at 6 PM. 90 tablet 3  . cetirizine (ZYRTEC) 10 MG tablet Take 10 mg by mouth daily as needed for allergies.    . Continuous Blood Gluc Receiver (FREESTYLE LIBRE 14 DAY READER) DEVI 1 Device by Does not apply route as directed. 1 Device 0  . Continuous Blood Gluc Sensor (FREESTYLE LIBRE 14 DAY SENSOR) MISC 2 patches by Does not apply route every 30 (thirty) days. 2 each 5  . CONTOUR NEXT TEST test strip USE UP TO FOUR TIMES DAILY AS DIRECTLY. DX E11.9 400 each 3  . Insulin Glargine (LANTUS SOLOSTAR) 100 UNIT/ML Solostar Pen Inject 45 Units into the skin daily. 105 mL 3  . Insulin Pen Needle (B-D UF III MINI PEN NEEDLES) 31G X  5 MM MISC Use daily with victoza and lantus 200 each 3  . Ipratropium-Albuterol (COMBIVENT RESPIMAT) 20-100 MCG/ACT AERS respimat Inhale 1 puff into the lungs every 6 (six) hours as needed for wheezing or shortness of breath. 1 Inhaler 3  . liraglutide (VICTOZA) 18 MG/3ML SOPN Inject 0.3 mLs (1.8 mg total) into the skin daily. 15 mL 11  . losartan-hydrochlorothiazide (HYZAAR) 50-12.5 MG tablet Take 1 tablet by mouth daily. 90 tablet 3  . metoprolol tartrate (LOPRESSOR) 25 MG tablet TAKE 1 TABLET (25 MG TOTAL) BY MOUTH 2 (TWO) TIMES DAILY. 60 tablet 6  . Multiple Vitamin (MULTIVITAMIN WITH MINERALS) TABS tablet Take 1 tablet by mouth daily. Reported on 01/03/2016    . PRESCRIPTION MEDICATION Inhale into the lungs at bedtime. CPAP     No current facility-administered medications on file prior to visit.     Review of Systems:  As per HPI- otherwise negative. No symptomatic lows noted  No fever chills, no chest pain or shortness of breath  Physical Examination: Vitals:   10/29/18 1320  BP: 138/80  Pulse: 95  Resp: 18  Temp: 98.3 F (36.8 C)  SpO2: 99%   Vitals:   10/29/18 1320  Weight: 235 lb (106.6 kg)  Height: 5' 2.5" (1.588 m)   Body mass index is 42.3 kg/m. Ideal Body Weight: Weight in (lb) to have BMI = 25: 138.6  GEN: WDWN, NAD, Non-toxic, A & O x 3, obese, looks well HEENT: Atraumatic, Normocephalic. Neck supple. No masses, No LAD. Ears and Nose: No external deformity. CV: RRR, No M/G/R. No JVD. No thrill. No extra heart sounds. PULM: CTA B, no wheezes, crackles, rhonchi. No retractions. No resp. distress. No accessory muscle use. EXTR: No c/c/e NEURO Normal gait.  PSYCH: Normally interactive. Conversant. Not depressed or anxious appearing.  Calm demeanor.    Assessment and Plan: Diabetes mellitus type 2 in obese ([place]) - Plan: Hemoglobin A1c, Comprehensive metabolic panel  Essential hypertension - Plan: CBC  Chronic obstructive pulmonary disease, unspecified COPD  type ([place])  Medication  monitoring encounter  Morbid obesity due to excess calories [facility])  She is losing weight intentionally and doing great!  We will check her A1c today and see how she looks  If necessary, I will prescribe glipizide for her to use.  Encouraged her to continue her weight loss efforts Blood pressures under good control today, continue metoprolol  Signed Lamar Blinks, MD  Received her labs, message to patient  Results for orders placed or performed in visit on 10/29/18  Hemoglobin A1c  Result Value Ref Range   Hgb A1c MFr Bld 8.9 (H) 4.6 - 6.5 %  CBC  Result Value Ref Range   WBC 9.4 4.0 - 10.5 K/uL   RBC 5.98 (H) 3.87 - 5.11 Mil/uL   Platelets 331.0 150.0 - 400.0 K/uL   Hemoglobin 10.9 (L) 12.0 - 15.0 g/dL   HCT 35.5 (L) 36.0 - 46.0 %   MCV 59.3 Repeated and verified X2. (L) 78.0 - 100.0 fl   MCHC 30.8 30.0 - 36.0 g/dL   RDW 17.3 (H) 11.5 - 15.5 %  Comprehensive metabolic panel  Result Value Ref Range   Sodium 135 135 - 145 mEq/L   Potassium 3.9 3.5 - 5.1 mEq/L   Chloride 98 96 - 112 mEq/L   CO2 27 19 - 32 mEq/L   Glucose, Bld 247 (H) 70 - 99 mg/dL   BUN 16 6 - 23 mg/dL   Creatinine, Ser 1.12 0.40 - 1.20 mg/dL   Total Bilirubin 0.5 0.2 - 1.2 mg/dL   Alkaline Phosphatase 104 39 - 117 U/L   AST 14 0 - 37 U/L   ALT 21 0 - 35 U/L   Total Protein 7.9 6.0 - 8.3 g/dL   Albumin 4.5 3.5 - 5.2 g/dL   Calcium 10.0 8.4 - 10.5 mg/dL   GFR 62.83 >60.00 mL/min   Her A1c is down from 10% Known history of thalassemia

## 2018-10-29 ENCOUNTER — Ambulatory Visit (INDEPENDENT_AMBULATORY_CARE_PROVIDER_SITE_OTHER): Payer: Medicare HMO | Admitting: Family Medicine

## 2018-10-29 ENCOUNTER — Encounter: Payer: Self-pay | Admitting: Family Medicine

## 2018-10-29 VITALS — BP 138/80 | HR 95 | Temp 98.3°F | Resp 18 | Ht 62.5 in | Wt 235.0 lb

## 2018-10-29 DIAGNOSIS — E669 Obesity, unspecified: Secondary | ICD-10-CM | POA: Diagnosis not present

## 2018-10-29 DIAGNOSIS — J449 Chronic obstructive pulmonary disease, unspecified: Secondary | ICD-10-CM

## 2018-10-29 DIAGNOSIS — E1169 Type 2 diabetes mellitus with other specified complication: Secondary | ICD-10-CM

## 2018-10-29 DIAGNOSIS — I10 Essential (primary) hypertension: Secondary | ICD-10-CM

## 2018-10-29 DIAGNOSIS — Z5181 Encounter for therapeutic drug level monitoring: Secondary | ICD-10-CM | POA: Diagnosis not present

## 2018-10-29 LAB — HEMOGLOBIN A1C: Hgb A1c MFr Bld: 8.9 % — ABNORMAL HIGH (ref 4.6–6.5)

## 2018-10-29 LAB — CBC
HCT: 35.5 % — ABNORMAL LOW (ref 36.0–46.0)
Hemoglobin: 10.9 g/dL — ABNORMAL LOW (ref 12.0–15.0)
MCHC: 30.8 g/dL (ref 30.0–36.0)
MCV: 59.3 fl — ABNORMAL LOW (ref 78.0–100.0)
Platelets: 331 10*3/uL (ref 150.0–400.0)
RBC: 5.98 Mil/uL — ABNORMAL HIGH (ref 3.87–5.11)
RDW: 17.3 % — ABNORMAL HIGH (ref 11.5–15.5)
WBC: 9.4 10*3/uL (ref 4.0–10.5)

## 2018-10-29 LAB — COMPREHENSIVE METABOLIC PANEL
ALT: 21 U/L (ref 0–35)
AST: 14 U/L (ref 0–37)
Albumin: 4.5 g/dL (ref 3.5–5.2)
Alkaline Phosphatase: 104 U/L (ref 39–117)
BUN: 16 mg/dL (ref 6–23)
CO2: 27 mEq/L (ref 19–32)
Calcium: 10 mg/dL (ref 8.4–10.5)
Chloride: 98 mEq/L (ref 96–112)
Creatinine, Ser: 1.12 mg/dL (ref 0.40–1.20)
GFR: 62.83 mL/min (ref >60.00)
Glucose, Bld: 247 mg/dL — ABNORMAL HIGH (ref 70–99)
Potassium: 3.9 mEq/L (ref 3.5–5.1)
Sodium: 135 mEq/L (ref 135–145)
Total Bilirubin: 0.5 mg/dL (ref 0.2–1.2)
Total Protein: 7.9 g/dL (ref 6.0–8.3)

## 2018-10-29 NOTE — Patient Instructions (Signed)
Great job with weight loss!  I am very excited for you We will check on your labs today, and I will be in touch with your A1c.  Depending on your results we will add glipizide if needed, but I hope that we won't need to

## 2018-11-17 ENCOUNTER — Other Ambulatory Visit: Payer: Self-pay | Admitting: Family Medicine

## 2018-12-27 ENCOUNTER — Other Ambulatory Visit: Payer: Self-pay | Admitting: Hematology & Oncology

## 2019-01-14 HISTORY — PX: TRANSTHORACIC ECHOCARDIOGRAM: SHX275

## 2019-02-19 ENCOUNTER — Encounter: Payer: Self-pay | Admitting: Family Medicine

## 2019-03-16 ENCOUNTER — Encounter: Payer: Self-pay | Admitting: Family Medicine

## 2019-03-18 ENCOUNTER — Telehealth: Payer: Self-pay | Admitting: Family Medicine

## 2019-03-18 NOTE — Telephone Encounter (Signed)
LVM for pt to call the office since pt stated needing an appt for Medication refill. Ok to schedule VOV.
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
## 2019-03-23 ENCOUNTER — Other Ambulatory Visit: Payer: Self-pay

## 2019-03-23 ENCOUNTER — Ambulatory Visit (INDEPENDENT_AMBULATORY_CARE_PROVIDER_SITE_OTHER): Payer: Medicare HMO | Admitting: Family Medicine

## 2019-03-23 ENCOUNTER — Encounter: Payer: Self-pay | Admitting: Family Medicine

## 2019-03-23 VITALS — BP 128/88 | HR 89 | Temp 98.0°F | Resp 16 | Ht 62.5 in | Wt 244.0 lb

## 2019-03-23 DIAGNOSIS — Z6841 Body Mass Index (BMI) 40.0 and over, adult: Secondary | ICD-10-CM | POA: Diagnosis not present

## 2019-03-23 DIAGNOSIS — Z1239 Encounter for other screening for malignant neoplasm of breast: Secondary | ICD-10-CM

## 2019-03-23 DIAGNOSIS — D563 Thalassemia minor: Secondary | ICD-10-CM | POA: Diagnosis not present

## 2019-03-23 DIAGNOSIS — I10 Essential (primary) hypertension: Secondary | ICD-10-CM

## 2019-03-23 DIAGNOSIS — Z13820 Encounter for screening for osteoporosis: Secondary | ICD-10-CM

## 2019-03-23 DIAGNOSIS — Z5181 Encounter for therapeutic drug level monitoring: Secondary | ICD-10-CM | POA: Diagnosis not present

## 2019-03-23 DIAGNOSIS — E1169 Type 2 diabetes mellitus with other specified complication: Secondary | ICD-10-CM | POA: Diagnosis not present

## 2019-03-23 DIAGNOSIS — J449 Chronic obstructive pulmonary disease, unspecified: Secondary | ICD-10-CM | POA: Diagnosis not present

## 2019-03-23 DIAGNOSIS — Z23 Encounter for immunization: Secondary | ICD-10-CM | POA: Diagnosis not present

## 2019-03-23 DIAGNOSIS — I214 Non-ST elevation (NSTEMI) myocardial infarction: Secondary | ICD-10-CM

## 2019-03-23 DIAGNOSIS — E669 Obesity, unspecified: Secondary | ICD-10-CM | POA: Diagnosis not present

## 2019-03-23 DIAGNOSIS — E785 Hyperlipidemia, unspecified: Secondary | ICD-10-CM

## 2019-03-23 DIAGNOSIS — E2839 Other primary ovarian failure: Secondary | ICD-10-CM

## 2019-03-23 DIAGNOSIS — Z794 Long term (current) use of insulin: Secondary | ICD-10-CM | POA: Diagnosis not present

## 2019-03-23 LAB — BASIC METABOLIC PANEL
BUN: 13 mg/dL (ref 6–23)
CO2: 29 mEq/L (ref 19–32)
Calcium: 9.1 mg/dL (ref 8.4–10.5)
Chloride: 101 mEq/L (ref 96–112)
Creatinine, Ser: 0.9 mg/dL (ref 0.40–1.20)
GFR: 75.99 mL/min (ref >60.00)
Glucose, Bld: 176 mg/dL — ABNORMAL HIGH (ref 70–99)
Potassium: 4.1 mEq/L (ref 3.5–5.1)
Sodium: 138 mEq/L (ref 135–145)

## 2019-03-23 LAB — LIPID PANEL
Cholesterol: 103 mg/dL (ref 0–200)
HDL: 45.8 mg/dL (ref >39.00)
LDL Cholesterol: 44 mg/dL (ref 0–99)
NonHDL: 57.5
Total CHOL/HDL Ratio: 2
Triglycerides: 66 mg/dL (ref 0.0–149.0)
VLDL: 13.2 mg/dL (ref 0.0–40.0)

## 2019-03-23 LAB — MICROALBUMIN / CREATININE URINE RATIO
Creatinine,U: 50.2 mg/dL
Microalb Creat Ratio: 1.4 mg/g (ref 0.0–30.0)
Microalb, Ur: <0.7 mg/dL (ref 0.0–1.9)

## 2019-03-23 LAB — HEMOGLOBIN A1C: Hgb A1c MFr Bld: 10.4 % — ABNORMAL HIGH (ref 4.6–6.5)

## 2019-03-23 MED ORDER — LOSARTAN POTASSIUM 50 MG PO TABS: ORAL_TABLET | ORAL | 3 refills | Status: DC

## 2019-03-23 MED ORDER — ATORVASTATIN CALCIUM 80 MG PO TABS: 80.0000 mg | ORAL_TABLET | Freq: Every day | ORAL | 3 refills | Status: DC

## 2019-03-23 MED ORDER — LIRAGLUTIDE 18 MG/3ML ~~LOC~~ SOPN: 1.8000 mg | PEN_INJECTOR | Freq: Every day | SUBCUTANEOUS | 11 refills | Status: DC

## 2019-03-23 MED ORDER — HYDROCHLOROTHIAZIDE 12.5 MG PO TABS: ORAL_TABLET | ORAL | 3 refills | Status: DC

## 2019-03-23 NOTE — Patient Instructions (Addendum)
Great to see you again today!   You got your "prevnar" pneumonia vaccine today- we will do a "pneumovax" booster in one year  I gave you a sample pen of Basaglar insulin- this is the same thing as lantus.  We may need to use it while we get you (hopefully) off insulin and onto glipizide

## 2019-03-24 ENCOUNTER — Encounter: Payer: Self-pay | Admitting: Family Medicine

## 2019-03-24 DIAGNOSIS — E669 Obesity, unspecified: Secondary | ICD-10-CM

## 2019-03-24 DIAGNOSIS — E1169 Type 2 diabetes mellitus with other specified complication: Secondary | ICD-10-CM

## 2019-03-25 ENCOUNTER — Encounter: Payer: Self-pay | Admitting: Family Medicine

## 2019-03-25 MED ORDER — INSULIN GLARGINE 100 UNIT/ML SOLOSTAR PEN: 45.0000 [IU] | PEN_INJECTOR | Freq: Every day | SUBCUTANEOUS | 3 refills | Status: DC

## 2019-03-27 ENCOUNTER — Encounter: Payer: Self-pay | Admitting: Family Medicine

## 2019-03-27 DIAGNOSIS — Z7409 Other reduced mobility: Secondary | ICD-10-CM

## 2019-03-31 ENCOUNTER — Other Ambulatory Visit: Payer: Self-pay

## 2019-03-31 ENCOUNTER — Ambulatory Visit (INDEPENDENT_AMBULATORY_CARE_PROVIDER_SITE_OTHER): Payer: Medicare HMO | Admitting: Internal Medicine

## 2019-03-31 ENCOUNTER — Encounter: Payer: Self-pay | Admitting: Internal Medicine

## 2019-03-31 VITALS — BP 122/78 | HR 87 | Temp 98.3°F | Ht 62.5 in | Wt 242.2 lb

## 2019-03-31 DIAGNOSIS — Z794 Long term (current) use of insulin: Secondary | ICD-10-CM | POA: Diagnosis not present

## 2019-03-31 DIAGNOSIS — E1165 Type 2 diabetes mellitus with hyperglycemia: Secondary | ICD-10-CM | POA: Diagnosis not present

## 2019-03-31 DIAGNOSIS — E1139 Type 2 diabetes mellitus with other diabetic ophthalmic complication: Secondary | ICD-10-CM | POA: Diagnosis not present

## 2019-03-31 DIAGNOSIS — E1159 Type 2 diabetes mellitus with other circulatory complications: Secondary | ICD-10-CM | POA: Diagnosis not present

## 2019-03-31 MED ORDER — VICTOZA 18 MG/3ML ~~LOC~~ SOPN: 1.8000 mg | PEN_INJECTOR | Freq: Every day | SUBCUTANEOUS | 11 refills | Status: DC

## 2019-03-31 MED ORDER — LANTUS SOLOSTAR 100 UNIT/ML ~~LOC~~ SOPN: 56.0000 [IU] | PEN_INJECTOR | Freq: Every day | SUBCUTANEOUS | 11 refills | Status: DC

## 2019-03-31 MED ORDER — FARXIGA 10 MG PO TABS: 10.0000 mg | ORAL_TABLET | Freq: Every day | ORAL | 6 refills | Status: DC

## 2019-03-31 MED ORDER — FARXIGA 5 MG PO TABS: 5.0000 mg | ORAL_TABLET | Freq: Every day | ORAL | 0 refills | Status: DC

## 2019-03-31 NOTE — Progress Notes (Signed)
Name: Romona Murdy  MRN/ DOB: [id number], Jul 03, 1956   Age/ Sex: 63 y.o., female    PCP: Copland, Gay Filler, MD   Reason for Endocrinology Evaluation: Type 2 Diabetes Mellitus     Date of Initial Endocrinology Visit: 03/31/2019     PATIENT IDENTIFIER: Ms. Mercedies Ganesh is a 63 y.o. female with a past medical history of T2DM, HTN, dyslipidemia,thalassemia, OSA on CPAP . The patient presented for initial endocrinology clinic visit on 03/31/2019 for consultative assistance with her diabetes management.    HPI: Ms. Brymer was    Diagnosed with T2DM in 2010 Prior Medications tried/Intolerance: Metformin- Diarrhea. Was on Glipizide but no reported intolerance.  Currently checking blood sugars 3 x / day,  before breakfast, middle of the day and bedtime  Hypoglycemia episodes : no           Hemoglobin A1c has ranged from 7.7% in 2018, peaking at 10.4% in 2020. Patient required assistance for hypoglycemia: no Patient has required hospitalization within the last 1 year from hyper or hypoglycemia: no   In terms of diet, the patient eats 1-2 meals a day, snacks on peanuts, ice cream. The patient avoids sugar-sweetened beverages.    HOME DIABETES REGIMEN: Lantus 56 units daily  Victoza 1.8 mg daily     Statin: Yes ACE-I/ARB: Yes Prior Diabetic Education: no    METER DOWNLOAD SUMMARY: Did not bring  This am 190 mg/dL  Last night 198 mg/dL   DIABETIC COMPLICATIONS: Microvascular complications:   Retinopathy (bleeding in the eye )   Denies: CKD, neuropathy   Last eye exam: Completed 2019  Macrovascular complications:   CAD  Denies: PVD, CVA   PAST HISTORY: Past Medical History:  Past Medical History:  Diagnosis Date  . Anginal pain ([place])   . Anxiety   . Arthritis    "knees, wrists, back, elbows" (07/03/2017)  . Clotting disorder ([place])    blood clot in eye 2016  . Coronary artery disease, non-occlusive    a. 11/2016 NSTEMI/Cath: LM nl, LAD 40p, 85md, D1/2  small, LCX 40ost, OM2/3 nl/small, RCA 35 ost/mid, RPDA/RPL small/nl.  . Depression   . Diastolic dysfunction    a. 11/2016 Echo: EF 55-60%, gr1 DD, sev calcified MV annulus, mildly dil LA.  Marland Kitchen Eye hemorrhage 01/2015   "right; resolved" (07/03/2017)  . Headache    "monthly" (07/03/2017)  . Heart murmur   . History of blood transfusion    "when I had an ectopic pregnancy"  . History of stomach ulcers   . Hyperlipidemia   . Hypertension   . Microcytic anemia   . OSA on CPAP    setting is unknown  . Pneumonia    "several times" (07/03/2017)  . Syncope 07/03/2017 X 2   called seizures but no medications  . Thalassemia    "my cells are sickle cell shaped but I don't have sickle cell anemia" (07/03/2017)  . Type II diabetes mellitus ([place])    Past Surgical History:  Past Surgical History:  Procedure Laterality Date  . 48-Hour Monitor  03/18/2017   Sinus rhythm with sinus tachycardia (rate 58-134 BPM)multiple PVCs noted with couplets and bigeminy. One triplet. 7 runs of PAT ranging from 100-130 bpm. Longest was 33 beats.  Marland Kitchen BREAST BIOPSY Left   . CARDIAC CATHETERIZATION N/A 11/19/2016   Procedure: Left Heart Cath and Coronary Angiography;  Surgeon: Belva Crome, MD;  Location: [facility];  Service: Cardiovascular.    LM nl, LAD 40p, 38md,  D1/2 small, LCX 40ost, OM2/3 nl/small, RCA 35 ost/mid, RPDA/RPL small/nl.  . CARPAL TUNNEL RELEASE Right 11/01/2014  . COLONOSCOPY    . DILATION AND CURETTAGE OF UTERUS    . ECTOPIC PREGNANCY SURGERY  X 2  . JOINT REPLACEMENT    . KNEE ARTHROSCOPY Left 11/2014  . TONSILLECTOMY    . TOTAL KNEE ARTHROPLASTY Right 02/18/2015   Procedure: RIGHT TOTAL KNEE ARTHROPLASTY;  Surgeon: Netta Cedars, MD;  Location: [facility];  Service: Orthopedics;  Laterality: Right;  . TOTAL KNEE ARTHROPLASTY Left 08/19/2015   Procedure: LEFT TOTAL KNEE ARTHROPLASTY;  Surgeon: Netta Cedars, MD;  Location: [facility];  Service: Orthopedics;  Laterality: Left;  . TRANSTHORACIC  ECHOCARDIOGRAM  11/2016; 07/2017:   a) normal LV size, thickness and function. EF 55-60%. GR 1 DD.No RWMA severe mitral annular calcification, but no mitral stenosis. Mild LA dilation;; b) normal LV size and function.  EF 65-75%.  Unable to assess diastolic function.  Aortic sclerosis with no stenosis.  Moderate mitral stenosis? (Gradient 9 mmHg) -?  Mild-mod left atrial enlargement   . VAGINAL HYSTERECTOMY        Social History:  reports that she quit smoking about 4 years ago. Her smoking use included cigarettes. She has a 11.00 pack-year smoking history. She has never used smokeless tobacco. She reports current alcohol use. She reports that she does not use drugs. Family History:  Family History  Problem Relation Age of Onset  . Cancer Mother   . Diabetes Mother   . Hypertension Mother   . Hyperlipidemia Mother   . Cancer Father   . Hyperlipidemia Father   . Mental illness Sister   . Diabetes Sister   . Diabetes Maternal Grandmother   . Diabetes Maternal Grandfather   . Colon cancer Neg Hx   . Esophageal cancer Neg Hx   . Stomach cancer Neg Hx   . Rectal cancer Neg Hx      HOME MEDICATIONS: Allergies as of 03/31/2019      Reactions   Penicillins Hives   Childhood allergy Has patient had a PCN reaction causing immediate rash, facial/tongue/throat swelling, SOB or lightheadedness with hypotension: Yes Has patient had a PCN reaction causing severe rash involving mucus membranes or skin necrosis: No Has patient had a PCN reaction that required hospitalization No Has patient had a PCN reaction occurring within the last 10 years: No If all of the above answers are "NO", then may proceed with Cephalosporin use.   Metformin And Related    Diarrhea, bleeding      Medication List       Accurate as of March 31, 2019  3:16 PM. If you have any questions, ask your nurse or doctor.        aspirin 81 MG chewable tablet Chew 1 tablet (81 mg total) by mouth daily.   atorvastatin 80  MG tablet Commonly known as: LIPITOR Take 1 tablet (80 mg total) by mouth daily at 6 PM.   cetirizine 10 MG tablet Commonly known as: ZYRTEC Take 10 mg by mouth daily as needed for allergies.   Contour Next Test test strip Generic drug: glucose blood USE UP TO FOUR TIMES DAILY AS DIRECTLY. DX L87.5   folic acid 1 MG tablet Commonly known as: FOLVITE TAKE 2 TABLETS BY MOUTH EVERY DAY   FreeStyle Libre 14 Day Reader [facility] 1 Device by Does not apply route as directed.   FreeStyle Libre 14 Day Sensor Misc 2 patches by Does not apply  route every 30 (thirty) days.   hydrochlorothiazide 12.5 MG tablet Commonly known as: HYDRODIURIL Take one by mouth daily   Insulin Glargine 100 UNIT/ML Solostar Pen Commonly known as: Lantus SoloStar Inject 45 Units into the skin daily. What changed: how much to take   Insulin Pen Needle 31G X 5 MM Misc Commonly known as: B-D UF III MINI PEN NEEDLES USE DAILY WITH VICTOZA AND LANTUS.   Ipratropium-Albuterol 20-100 MCG/ACT Aers respimat Commonly known as: Combivent Respimat Inhale 1 puff into the lungs every 6 (six) hours as needed for wheezing or shortness of breath.   liraglutide 18 MG/3ML Sopn Commonly known as: Victoza Inject 0.3 mLs (1.8 mg total) into the skin daily.   losartan 50 MG tablet Commonly known as: COZAAR Take one by mouth daily   multivitamin with minerals Tabs tablet Take 1 tablet by mouth daily. Reported on 01/03/2016   PRESCRIPTION MEDICATION Inhale into the lungs at bedtime. CPAP        ALLERGIES: Allergies  Allergen Reactions  . Penicillins Hives    Childhood allergy Has patient had a PCN reaction causing immediate rash, facial/tongue/throat swelling, SOB or lightheadedness with hypotension: Yes Has patient had a PCN reaction causing severe rash involving mucus membranes or skin necrosis: No Has patient had a PCN reaction that required hospitalization No Has patient had a PCN reaction occurring within the  last 10 years: No If all of the above answers are "NO", then may proceed with Cephalosporin use.  . Metformin And Related     Diarrhea, bleeding     REVIEW OF SYSTEMS: A comprehensive ROS was conducted with the patient and is negative except as per HPI and below:  Review of Systems  Constitutional: Negative for chills and fever.  HENT: Negative for congestion and sore throat.   Eyes: Positive for blurred vision. Negative for pain.       Floaters in the left eye   Respiratory: Negative for cough and shortness of breath.   Cardiovascular: Positive for palpitations. Negative for chest pain.  Gastrointestinal: Positive for diarrhea. Negative for nausea.       Chronic diarrhea.   Genitourinary: Positive for frequency.  Neurological: Negative for tingling and tremors.  Endo/Heme/Allergies: Positive for polydipsia.  Psychiatric/Behavioral: Negative for depression. The patient is nervous/anxious.       OBJECTIVE:   VITAL SIGNS: BP 122/78 (BP Location: Left Arm, Patient Position: Sitting, Cuff Size: Large)   Pulse 87   Temp 98.3 F (36.8 C) (Oral)   Wt 242 lb 3.2 oz (109.9 kg)   SpO2 97%   BMI 43.59 kg/m    PHYSICAL EXAM:  General: Pt appears well and is in NAD  Hydration: Well-hydrated with moist mucous membranes and good skin turgor  HEENT: Head: Unremarkable with good dentition. Oropharynx clear without exudate.  Eyes: External eye exam normal without stare, lid lag or exophthalmos.  EOM intact.  PERRL.  Neck: General: Supple without adenopathy or carotid bruits. Thyroid: Thyroid size normal.  No goiter or nodules appreciated. No thyroid bruit.  Lungs: Clear with good BS bilat with no rales, rhonchi, or wheezes  Heart: RRR with normal S1 and S2 and no gallops; no murmurs; no rub  Abdomen: Normoactive bowel sounds, soft, nontender, without masses or organomegaly palpable  Extremities:  Lower extremities - No pretibial edema. No lesions.  Skin: Normal texture and temperature  to palpation. No rash noted. No Acanthosis nigricans/skin tags.  Neuro: MS is good with appropriate affect, pt is alert and  Ox3    DM foot exam: 03/31/2019  The skin of the feet is intact without sores or ulcerations. The pedal pulses are 2+ on right and 2+ on left. The sensation is intact to a screening 5.07, 10 gram monofilament bilaterally  DATA REVIEWED:  Lab Results  Component Value Date   HGBA1C 10.4 (H) 03/23/2019   HGBA1C 8.9 (H) 10/29/2018   HGBA1C 10.0 (H) 08/04/2018   Lab Results  Component Value Date   MICROALBUR <0.7 03/23/2019   LDLCALC 44 03/23/2019   CREATININE 0.90 03/23/2019   Lab Results  Component Value Date   MICRALBCREAT 1.4 03/23/2019    Lab Results  Component Value Date   CHOL 103 03/23/2019   HDL 45.80 03/23/2019   LDLCALC 44 03/23/2019   LDLDIRECT 131 (H) 09/02/2013   TRIG 66.0 03/23/2019   CHOLHDL 2 03/23/2019        ASSESSMENT / PLAN / RECOMMENDATIONS:   1) Type 2 Diabetes Mellitus, Poorly controlled, With retinopathy and macrovascular complications - Most recent A1c of 10.4 %. Goal A1c <7.0 %.    Plan: GENERAL: I have discussed with the patient the pathophysiology of diabetes. We went over the natural progression of the disease. We talked about both insulin resistance and insulin deficiency. We stressed the importance of lifestyle changes including diet and exercise. I explained the complications associated with diabetes including retinopathy, nephropathy, neuropathy as well as increased risk of cardiovascular disease. We went over the benefit seen with glycemic control.   I explained to the patient that diabetic patients are at higher than normal risk for amputations. The patient was informed that diabetes is the number one cause of non-traumatic amputations in [place].   The pt will benefit from seeing a CDE, she could do a lot of improvement in her diet, which will improve her glucose and maybe able to reduce her medications.   Despite  having expensive meds, she is able to afford them at this time and denies skipping on any of her meds. We discussed that insulin is more potent then glipizide, with an A1c of > 10.0% switching her from insulin to glipizide will not be a good move, if this becomes problematic, we could always use NPH.   We discussed her limited treatment options since she is intolerant to metformin, actos is not an option due to fluid overload in the past, we can't combine DPP-4 inhibitors with GLp-1 agonists. The options available today are SGLT-2 inhibitors and prandial insulin.   Pt opted for SGLT-2 inhibitors, we discussed risk of genital infections, we discussed  CV benefits, as well as glucose control and weight loss  We did discuss the pharmacokinetics of long and short acting insulin  MEDICATIONS: Start Farxiga 5 mg daily - if no side effects after a month, may increase to 10 mg daily  Continue Lantus 56 units daily  Continue Victoza 1.8 mg daily     EDUCATION / INSTRUCTIONS:  BG monitoring instructions: Patient is instructed to check her blood sugars 2 times a day, fasting and bedtime.  Call [facility] if: BG persistently < 70 or > 300. . I reviewed the Rule of 15 for the treatment of hypoglycemia in detail with the patient. Literature supplied.   2) Diabetic complications:   Eye: Does have known diabetic retinopathy.   Neuro/ Feet: Does not have known diabetic peripheral neuropathy.  Renal: Patient does not have known baseline CKD. She is on an ACEI/ARB at present.  3) Lipids: Patient is  on a statin.    4) Hypertension: She is  at goal of < 140/90 mmHg.     F/u in 8 weeks   Signed electronically by: Mack Guise, MD  [facility]  [facility] [address] [place], [place] [address] Phone: [phone] FAX: [phone]   CC: [name], [address] [place] [address] Phone:  [phone]  Fax: [phone]    Return to Endocrinology clinic as below: No future appointments.

## 2019-03-31 NOTE — Patient Instructions (Signed)
-   Start Farxiga 5 mg (1 tablet) daily, after one month, increase to 10 mg daily  - Continue Lantus at 56 units daily  - Continue Victoza 1.8 mg daily

## 2019-04-01 DIAGNOSIS — Z7409 Other reduced mobility: Secondary | ICD-10-CM | POA: Insufficient documentation

## 2019-04-24 ENCOUNTER — Other Ambulatory Visit: Payer: Self-pay

## 2019-04-24 ENCOUNTER — Other Ambulatory Visit: Payer: Self-pay | Admitting: Internal Medicine

## 2019-05-01 ENCOUNTER — Encounter: Payer: Self-pay | Admitting: Dietician

## 2019-05-01 ENCOUNTER — Other Ambulatory Visit: Payer: Self-pay

## 2019-05-01 ENCOUNTER — Encounter: Payer: Medicare HMO | Attending: Internal Medicine | Admitting: Dietician

## 2019-05-01 DIAGNOSIS — E1165 Type 2 diabetes mellitus with hyperglycemia: Secondary | ICD-10-CM | POA: Diagnosis not present

## 2019-05-01 NOTE — Patient Instructions (Addendum)
Discuss your insulin and money with your physician.  Consider using Orgain protein powder in your smoothie or a peanut butter powder such as PB2. Consider reducing the amount of peanuts and other nuts.  A serving size is what fits in your closed palm. Great job on your breverage choice! Remove the skin from the chicken. Find ways to increase your non-starchy vegetables. Aim to be as active as you can.  Aim for 30 minutes most days.  Aim for 2-3 Carb Choices per meal (30-45 grams) +/- 1 either way  Aim for 0-15 Carbs per snack if hungry (watch portion size and consider protein options).  Include protein in moderation with your meals and snacks Consider reading food labels for Total Carbohydrate of foods Continue checking BG at alternate times per day  Continue taking medication as directed by MD
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
## 2019-05-04 ENCOUNTER — Other Ambulatory Visit: Payer: Self-pay

## 2019-05-04 ENCOUNTER — Other Ambulatory Visit: Payer: Self-pay | Admitting: Family Medicine

## 2019-05-04 DIAGNOSIS — E1169 Type 2 diabetes mellitus with other specified complication: Secondary | ICD-10-CM

## 2019-05-04 DIAGNOSIS — I214 Non-ST elevation (NSTEMI) myocardial infarction: Secondary | ICD-10-CM

## 2019-05-04 MED ORDER — CONTOUR NEXT TEST VI STRP: ORAL_STRIP | 3 refills | Status: DC

## 2019-05-22 ENCOUNTER — Other Ambulatory Visit: Payer: Self-pay

## 2019-05-26 ENCOUNTER — Other Ambulatory Visit: Payer: Self-pay

## 2019-05-26 ENCOUNTER — Encounter: Payer: Self-pay | Admitting: Internal Medicine

## 2019-05-26 ENCOUNTER — Ambulatory Visit (INDEPENDENT_AMBULATORY_CARE_PROVIDER_SITE_OTHER): Payer: Medicare HMO | Admitting: Internal Medicine

## 2019-05-26 VITALS — BP 128/72 | HR 82 | Temp 98.6°F | Ht 63.0 in | Wt 243.8 lb

## 2019-05-26 DIAGNOSIS — E1139 Type 2 diabetes mellitus with other diabetic ophthalmic complication: Secondary | ICD-10-CM | POA: Diagnosis not present

## 2019-05-26 DIAGNOSIS — Z794 Long term (current) use of insulin: Secondary | ICD-10-CM | POA: Diagnosis not present

## 2019-05-26 DIAGNOSIS — M79675 Pain in left toe(s): Secondary | ICD-10-CM | POA: Diagnosis not present

## 2019-05-26 DIAGNOSIS — E1169 Type 2 diabetes mellitus with other specified complication: Secondary | ICD-10-CM | POA: Insufficient documentation

## 2019-05-26 DIAGNOSIS — E1165 Type 2 diabetes mellitus with hyperglycemia: Secondary | ICD-10-CM | POA: Diagnosis not present

## 2019-05-26 DIAGNOSIS — E1159 Type 2 diabetes mellitus with other circulatory complications: Secondary | ICD-10-CM

## 2019-05-26 DIAGNOSIS — M79674 Pain in right toe(s): Secondary | ICD-10-CM | POA: Diagnosis not present

## 2019-05-26 DIAGNOSIS — E119 Type 2 diabetes mellitus without complications: Secondary | ICD-10-CM | POA: Insufficient documentation

## 2019-05-26 NOTE — Progress Notes (Signed)
Name: Lynn [name]  Age/ Sex: 63 y.o., female   MRN/ DOB: [id number], Feb 20, 1956     PCP: [name], MD   Reason for Endocrinology Evaluation: Type 2 Diabetes Mellitus  Initial Endocrine Consultative Visit: 03/31/2019    PATIENT IDENTIFIER: Lynn [name] is a 63 y.o. female with a past medical history of T2DM, HTN, dyslipidemia,thalassemia, OSA on CPAP . The patient has followed with Endocrinology clinic since 03/31/2019 for consultative assistance with management of her diabetes.  DIABETIC HISTORY:  Lynn [name] was diagnosed with T2DM in 2010, She has been on metformin in the past with reported prior intolerance due to diarrhea. Was on Glipizide at some point with no side effects.Has been on insulin for years. Her hemoglobin A1c has ranged from  7.7% in 2018, peaking at 10.4% in 2020.  On her initial presentation to our clinic, her A1c was 10.4% , she was on Lantus and Victoza    [name] was tried in 03/2019 but developed severe dizziness .  SUBJECTIVE:   During the last visit (03/31/2019): A1c was 10.4% , continued Lantus and Victoza , added farxiga   Today (05/26/2019): Lynn [name] is here for an 8 week follow up on diabetes management. She checks her blood sugars 1 times daily. The patient has not had hypoglycemic episodes since the last clinic visit.. Otherwise, the patient has not required any recent emergency interventions for hypoglycemia and has not had recent hospitalizations secondary to hyper or hypoglycemic episodes.   We started her on farxiga on the last visit but she couldn't tolerate it due to severe dizziness so she stopped it after 3 days.  She has been doing 5 minutes on the treadmill and 2 minutes on the bike after each meal.   She has been having toe pains, wears socks but no slippers, has carpet and hard wood floors.    ROS: As per HPI and as detailed below: Review  of Systems  Constitutional: Negative for fever and weight loss.  HENT: Negative for congestion and sore throat.   Cardiovascular: Negative for chest pain and palpitations.  Gastrointestinal: Negative for diarrhea and nausea.      HOME DIABETES REGIMEN:  Farxiga 10 mg daily- stopped due to dizziness Lantus 56 units daily  Victoza 1.8 mg daily     METER DOWNLOAD SUMMARY: Date range evaluated: 7/29-8/08/2019 Fingerstick Blood Glucose Tests = 14 Average Number Tests/Day = 1.0 Overall Mean FS Glucose = 141   BG Ranges: Low = 105 High = 174   Hypoglycemic Events/30 Days: BG < 50 = 0 Episodes of symptomatic severe hypoglycemia = 0     HISTORY:  Past Medical History:  Past Medical History:  Diagnosis Date  . Anginal pain ([place])   . Anxiety   . Arthritis    "knees, wrists, back, elbows" (07/03/2017)  . Clotting disorder ([place])    blood clot in eye 2016  . Coronary artery disease, non-occlusive    a. 11/2016 NSTEMI/Cath: LM nl, LAD 40p, 53md, D1/2 small, LCX 40ost, OM2/3 nl/small, RCA 35 ost/mid, RPDA/RPL small/nl.  . Depression   . Diastolic dysfunction    a. 11/2016 Echo: EF 55-60%, gr1 DD, sev calcified MV annulus, mildly dil LA.  Marland Kitchen Eye hemorrhage 01/2015   "right; resolved" (07/03/2017)  . Headache    "monthly" (07/03/2017)  . Heart murmur   . History of blood transfusion    "when I had an ectopic pregnancy"  . History of stomach ulcers   . Hyperlipidemia   .  Hypertension   . Microcytic anemia   . OSA on CPAP    setting is unknown  . Pneumonia    "several times" (07/03/2017)  . Syncope 07/03/2017 X 2   called seizures but no medications  . Thalassemia    "my cells are sickle cell shaped but I don't have sickle cell anemia" (07/03/2017)  . Type II diabetes mellitus ([place])    Past Surgical History:  Past Surgical History:  Procedure Laterality Date  . 48-Hour Monitor  03/18/2017   Sinus rhythm with sinus tachycardia (rate 58-134 BPM)multiple PVCs noted with  couplets and bigeminy. One triplet. 7 runs of PAT ranging from 100-130 bpm. Longest was 33 beats.  Marland Kitchen BREAST BIOPSY Left   . CARDIAC CATHETERIZATION N/A 11/19/2016   Procedure: Left Heart Cath and Coronary Angiography;  Surgeon: Belva Crome, MD;  Location: [facility];  Service: Cardiovascular.    LM nl, LAD 40p, 37md, D1/2 small, LCX 40ost, OM2/3 nl/small, RCA 35 ost/mid, RPDA/RPL small/nl.  . CARPAL TUNNEL RELEASE Right 11/01/2014  . COLONOSCOPY    . DILATION AND CURETTAGE OF UTERUS    . ECTOPIC PREGNANCY SURGERY  X 2  . JOINT REPLACEMENT    . KNEE ARTHROSCOPY Left 11/2014  . TONSILLECTOMY    . TOTAL KNEE ARTHROPLASTY Right 02/18/2015   Procedure: RIGHT TOTAL KNEE ARTHROPLASTY;  Surgeon: Netta Cedars, MD;  Location: [facility];  Service: Orthopedics;  Laterality: Right;  . TOTAL KNEE ARTHROPLASTY Left 08/19/2015   Procedure: LEFT TOTAL KNEE ARTHROPLASTY;  Surgeon: Netta Cedars, MD;  Location: [facility];  Service: Orthopedics;  Laterality: Left;  . TRANSTHORACIC ECHOCARDIOGRAM  11/2016; 07/2017:   a) normal LV size, thickness and function. EF 55-60%. GR 1 DD.No RWMA severe mitral annular calcification, but no mitral stenosis. Mild LA dilation;; b) normal LV size and function.  EF 65-75%.  Unable to assess diastolic function.  Aortic sclerosis with no stenosis.  Moderate mitral stenosis? (Gradient 9 mmHg) -?  Mild-mod left atrial enlargement   . VAGINAL HYSTERECTOMY      Social History:  reports that she quit smoking about 4 years ago. Her smoking use included cigarettes. She has a 11.00 pack-year smoking history. She has never used smokeless tobacco. She reports current alcohol use. She reports that she does not use drugs. Family History:  Family History  Problem Relation Age of Onset  . Cancer Mother   . Diabetes Mother   . Hypertension Mother   . Hyperlipidemia Mother   . Cancer Father   . Hyperlipidemia Father   . Mental illness Sister   . Diabetes Sister   . Diabetes Maternal  Grandmother   . Diabetes Maternal Grandfather   . Colon cancer Neg Hx   . Esophageal cancer Neg Hx   . Stomach cancer Neg Hx   . Rectal cancer Neg Hx      HOME MEDICATIONS: Allergies as of 05/26/2019      Reactions   Penicillins Hives   Childhood allergy Has patient had a PCN reaction causing immediate rash, facial/tongue/throat swelling, SOB or lightheadedness with hypotension: Yes Has patient had a PCN reaction causing severe rash involving mucus membranes or skin necrosis: No Has patient had a PCN reaction that required hospitalization No Has patient had a PCN reaction occurring within the last 10 years: No If all of the above answers are "NO", then may proceed with Cephalosporin use.   Farxiga [dapagliflozin] Other (See Comments)   Dizzy and lethargic   Metformin And  Related    Diarrhea, bleeding      Medication List       Accurate as of May 26, 2019  3:09 PM. If you have any questions, ask your nurse or doctor.        aspirin 81 MG chewable tablet Chew 1 tablet (81 mg total) by mouth daily.   atorvastatin 80 MG tablet Commonly known as: LIPITOR TAKE 1 TABLET(80 MG) BY MOUTH DAILY AT 6 PM   cetirizine 10 MG tablet Commonly known as: ZYRTEC Take 10 mg by mouth daily as needed for allergies.   Contour Next Test test strip Generic drug: glucose blood USE UP TO FOUR TIMES DAILY AS DIRECTLY. DX E11.9   Farxiga 10 MG Tabs tablet Generic drug: dapagliflozin propanediol Take 10 mg by mouth daily.   folic acid 1 MG tablet Commonly known as: FOLVITE TAKE 2 TABLETS BY MOUTH EVERY DAY   hydrochlorothiazide 12.5 MG tablet Commonly known as: HYDRODIURIL Take one by mouth daily   Insulin Pen Needle 31G X 5 MM Misc Commonly known as: B-D UF III MINI PEN NEEDLES USE DAILY WITH VICTOZA AND LANTUS.   Ipratropium-Albuterol 20-100 MCG/ACT Aers respimat Commonly known as: Combivent Respimat Inhale 1 puff into the lungs every 6 (six) hours as needed for wheezing or  shortness of breath.   Lantus SoloStar 100 UNIT/ML Solostar Pen Generic drug: Insulin Glargine Inject 56 Units into the skin daily.   losartan 50 MG tablet Commonly known as: COZAAR Take one by mouth daily   multivitamin with minerals Tabs tablet Take 1 tablet by mouth daily. Reported on 01/03/2016   PRESCRIPTION MEDICATION Inhale into the lungs at bedtime. CPAP   Victoza 18 MG/3ML Sopn Generic drug: liraglutide Inject 0.3 mLs (1.8 mg total) into the skin daily.        OBJECTIVE:   Vital Signs: BP 128/72 (BP Location: Left Arm, Patient Position: Sitting, Cuff Size: Large)   Pulse 82   Temp 98.6 F (37 C)   Ht 5\' 3"  (1.6 m)   Wt 243 lb 12.8 oz (110.6 kg)   SpO2 96%   BMI 43.19 kg/m   Wt Readings from Last 3 Encounters:  05/26/19 243 lb 12.8 oz (110.6 kg)  05/01/19 244 lb (110.7 kg)  03/31/19 242 lb 3.2 oz (109.9 kg)     Exam: General: Pt appears well and is in NAD  Neck: General: Supple without adenopathy. Thyroid: Thyroid size normal.  No goiter or nodules appreciated. No thyroid bruit.  Lungs: Clear with good BS bilat with no rales, rhonchi, or wheezes  Heart: RRR with normal S1 and S2 and no gallops; no murmurs; no rub  Extremities: No pretibial edema. No tremor. Normal toes, with no tenderness or swelling   Skin: Normal texture and temperature to palpation.  Neuro: MS is good with appropriate affect, pt is alert and Ox3           DM foot exam: 03/31/2019  The skin of the feet is intact without sores or ulcerations. The pedal pulses are 2+ on right and 2+ on left. The sensation is intact to a screening 5.07, 10 gram monofilament bilaterally    DATA REVIEWED:  Lab Results  Component Value Date   HGBA1C 10.4 (H) 03/23/2019   HGBA1C 8.9 (H) 10/29/2018   HGBA1C 10.0 (H) 08/04/2018   Lab Results  Component Value Date   MICROALBUR <0.7 03/23/2019   LDLCALC 44 03/23/2019   CREATININE 0.90 03/23/2019   Lab Results  Component  Value Date    MICRALBCREAT 1.4 03/23/2019     Lab Results  Component Value Date   CHOL 103 03/23/2019   HDL 45.80 03/23/2019   LDLCALC 44 03/23/2019   LDLDIRECT 131 (H) 09/02/2013   TRIG 66.0 03/23/2019   CHOLHDL 2 03/23/2019         ASSESSMENT / PLAN / RECOMMENDATIONS:   1) Type 2 Diabetes Mellitus, Poorly controlled, With retinopathy and macrovascular complications - Most recent A1c of 10.4 %. Goal A1c <7.0 %.    Plan:  - In review of the few readings on her meter download today, her BG's have been acceptable. She has been doing great work with diet and exercise and I have encouraged her to continue with this.  - Its unfortunate that she could not tolerate 5 mg Farxiga  - We will not be making any adjustment at this time.  - I have encouraged her to check glucose at bedtime when she can.     MEDICATIONS: - Continue Lantus at 56 units daily  - Continue Victoza 1.8 mg daily    EDUCATION / INSTRUCTIONS:  BG monitoring instructions: Patient is instructed to check her blood sugars 2 times a day, fasting and bedtime.  Call [facility] if: BG persistently < 70 or > 300. . I reviewed the Rule of 15 for the treatment of hypoglycemia in detail with the patient. Literature supplied.  2. Toe Pain:   - No pathology on today's exam, I am wondering if this is related to arthritis and recent initiation of exercise.  - Pt advised to wear closed slippers at home and to use Tylenol PRN    F/U in 3 months    Signed electronically by: Mack Guise, MD  [facility]  [address] [place], [place] [address] Phone: [phone] FAX: [phone]   CC: [name], [address] [place] [address] Phone: [phone]  Fax: [phone]  Return to Endocrinology clinic as below: Future Appointments  Date Time Provider [place]  06/02/2019 11:20 AM [facility] MM 2 [facility]  CE  07/08/2019  4:30 PM [facility] DX DEXA 1 [facility] CE

## 2019-05-26 NOTE — Patient Instructions (Signed)
-   Continue Lantus at 56 units daily  - Continue Victoza 1.8 mg daily

## 2019-06-02 ENCOUNTER — Ambulatory Visit
Admission: RE | Admit: 2019-06-02 | Discharge: 2019-06-02 | Disposition: A | Discharge status: Home / Self Care | Payer: Medicare HMO | Source: Ambulatory Visit | Attending: Family Medicine | Admitting: Family Medicine

## 2019-06-02 ENCOUNTER — Other Ambulatory Visit: Payer: Self-pay

## 2019-06-02 DIAGNOSIS — Z1239 Encounter for other screening for malignant neoplasm of breast: Secondary | ICD-10-CM

## 2019-06-02 DIAGNOSIS — Z1231 Encounter for screening mammogram for malignant neoplasm of breast: Secondary | ICD-10-CM | POA: Diagnosis not present

## 2019-07-08 ENCOUNTER — Encounter: Payer: Self-pay | Admitting: Family Medicine

## 2019-07-08 ENCOUNTER — Ambulatory Visit
Admission: RE | Admit: 2019-07-08 | Discharge: 2019-07-08 | Disposition: A | Discharge status: Home / Self Care | Payer: Medicare HMO | Source: Ambulatory Visit | Attending: Family Medicine | Admitting: Family Medicine

## 2019-07-08 ENCOUNTER — Other Ambulatory Visit: Payer: Self-pay

## 2019-07-08 DIAGNOSIS — Z13820 Encounter for screening for osteoporosis: Secondary | ICD-10-CM | POA: Diagnosis not present

## 2019-07-08 DIAGNOSIS — Z780 Asymptomatic menopausal state: Secondary | ICD-10-CM | POA: Diagnosis not present

## 2019-07-08 DIAGNOSIS — E2839 Other primary ovarian failure: Secondary | ICD-10-CM

## 2019-07-10 ENCOUNTER — Encounter: Payer: Self-pay | Admitting: Internal Medicine
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
## 2019-07-13 NOTE — Patient Instructions (Addendum)
I will be in touch with your labs ASAP- great to see you again today!  Please have an eye exam if not done already this year You might consider having the shingles vaccine, called Shingrix, given at your drugstore  We will check your A1c today.  I will also run some labs and look for any sign of rheumatoid arthritis- most likely you have osteoarthritis however, which is treated symptomatically    Health Maintenance After Age 65 After age 48, you are at a higher risk for certain long-term diseases and infections as well as injuries from falls. Falls are a major cause of broken bones and head injuries in people who are older than age 79. Getting regular preventive care can help to keep you healthy and well. Preventive care includes getting regular testing and making lifestyle changes as recommended by your health care provider. Talk with your health care provider about:  Which screenings and tests you should have. A screening is a test that checks for a disease when you have no symptoms.  A diet and exercise plan that is right for you. What should I know about screenings and tests to prevent falls? Screening and testing are the best ways to find a health problem early. Early diagnosis and treatment give you the best chance of managing medical conditions that are common after age 57. Certain conditions and lifestyle choices may make you more likely to have a fall. Your health care provider may recommend:  Regular vision checks. Poor vision and conditions such as cataracts can make you more likely to have a fall. If you wear glasses, make sure to get your prescription updated if your vision changes.  Medicine review. Work with your health care provider to regularly review all of the medicines you are taking, including over-the-counter medicines. Ask your health care provider about any side effects that may make you more likely to have a fall. Tell your health care provider if any medicines that you  take make you feel dizzy or sleepy.  Osteoporosis screening. Osteoporosis is a condition that causes the bones to get weaker. This can make the bones weak and cause them to break more easily.  Blood pressure screening. Blood pressure changes and medicines to control blood pressure can make you feel dizzy.  Strength and balance checks. Your health care provider may recommend certain tests to check your strength and balance while standing, walking, or changing positions.  Foot health exam. Foot pain and numbness, as well as not wearing proper footwear, can make you more likely to have a fall.  Depression screening. You may be more likely to have a fall if you have a fear of falling, feel emotionally low, or feel unable to do activities that you used to do.  Alcohol use screening. Using too much alcohol can affect your balance and may make you more likely to have a fall. What actions can I take to lower my risk of falls? General instructions  Talk with your health care provider about your risks for falling. Tell your health care provider if: ? You fall. Be sure to tell your health care provider about all falls, even ones that seem minor. ? You feel dizzy, sleepy, or off-balance.  Take over-the-counter and prescription medicines only as told by your health care provider. These include any supplements.  Eat a healthy diet and maintain a healthy weight. A healthy diet includes low-fat dairy products, low-fat (lean) meats, and fiber from whole grains, beans, and lots of  fruits and vegetables. Home safety  Remove any tripping hazards, such as rugs, cords, and clutter.  Install safety equipment such as grab bars in bathrooms and safety rails on stairs.  Keep rooms and walkways well-lit. Activity   Follow a regular exercise program to stay fit. This will help you maintain your balance. Ask your health care provider what types of exercise are appropriate for you.  If you need a cane or  walker, use it as recommended by your health care provider.  Wear supportive shoes that have nonskid soles. Lifestyle  Do not drink alcohol if your health care provider tells you not to drink.  If you drink alcohol, limit how much you have: ? 0-1 drink a day for women. ? 0-2 drinks a day for men.  Be aware of how much alcohol is in your drink. In the [place] one drink equals one typical bottle of beer (12 oz), one-half glass of wine (5 oz), or one shot of hard liquor (1 oz).  Do not use any products that contain nicotine or tobacco, such as cigarettes and e-cigarettes. If you need help quitting, ask your health care provider. Summary  Having a healthy lifestyle and getting preventive care can help to protect your health and wellness after age 17.  Screening and testing are the best way to find a health problem early and help you avoid having a fall. Early diagnosis and treatment give you the best chance for managing medical conditions that are more common for people who are older than age 22.  Falls are a major cause of broken bones and head injuries in people who are older than age 80. Take precautions to prevent a fall at home.  Work with your health care provider to learn what changes you can make to improve your health and wellness and to prevent falls. This information is not intended to replace advice given to you by your health care provider. Make sure you discuss any questions you have with your health care provider. Document Released: 08/14/2017 Document Revised: 01/22/2019 Document Reviewed: 08/14/2017 Elsevier Patient Education  2020 [employer].

## 2019-07-15 ENCOUNTER — Encounter: Payer: Self-pay | Admitting: Family Medicine

## 2019-07-15 ENCOUNTER — Ambulatory Visit (INDEPENDENT_AMBULATORY_CARE_PROVIDER_SITE_OTHER): Payer: Medicare HMO | Admitting: Family Medicine

## 2019-07-15 ENCOUNTER — Other Ambulatory Visit: Payer: Self-pay

## 2019-07-15 VITALS — BP 132/80 | HR 90 | Temp 96.6°F | Resp 16 | Ht 63.0 in | Wt 245.0 lb

## 2019-07-15 DIAGNOSIS — I214 Non-ST elevation (NSTEMI) myocardial infarction: Secondary | ICD-10-CM | POA: Diagnosis not present

## 2019-07-15 DIAGNOSIS — M2550 Pain in unspecified joint: Secondary | ICD-10-CM

## 2019-07-15 DIAGNOSIS — E1169 Type 2 diabetes mellitus with other specified complication: Secondary | ICD-10-CM

## 2019-07-15 DIAGNOSIS — E785 Hyperlipidemia, unspecified: Secondary | ICD-10-CM

## 2019-07-15 DIAGNOSIS — I10 Essential (primary) hypertension: Secondary | ICD-10-CM

## 2019-07-15 DIAGNOSIS — J449 Chronic obstructive pulmonary disease, unspecified: Secondary | ICD-10-CM | POA: Diagnosis not present

## 2019-07-15 DIAGNOSIS — E669 Obesity, unspecified: Secondary | ICD-10-CM

## 2019-07-15 DIAGNOSIS — Z0000 Encounter for general adult medical examination without abnormal findings: Secondary | ICD-10-CM | POA: Diagnosis not present

## 2019-07-15 DIAGNOSIS — Z7409 Other reduced mobility: Secondary | ICD-10-CM | POA: Diagnosis not present

## 2019-07-15 DIAGNOSIS — D563 Thalassemia minor: Secondary | ICD-10-CM | POA: Diagnosis not present

## 2019-07-15 MED ORDER — CONTOUR NEXT TEST VI STRP: ORAL_STRIP | 3 refills | Status: DC

## 2019-07-16 ENCOUNTER — Telehealth: Payer: Self-pay | Admitting: *Deleted

## 2019-07-16 DIAGNOSIS — E1169 Type 2 diabetes mellitus with other specified complication: Secondary | ICD-10-CM

## 2019-07-16 LAB — RHEUMATOID FACTOR: Rheumatoid fact SerPl-aCnc: 21 IU/mL — ABNORMAL HIGH (ref <14)

## 2019-07-16 LAB — CBC
HCT: 33.9 % — ABNORMAL LOW (ref 36.0–46.0)
Hemoglobin: 10.2 g/dL — ABNORMAL LOW (ref 12.0–15.0)
MCHC: 30.2 g/dL (ref 30.0–36.0)
MCV: 60.1 fl — ABNORMAL LOW (ref 78.0–100.0)
Platelets: 305 10*3/uL (ref 150.0–400.0)
RBC: 5.64 Mil/uL — ABNORMAL HIGH (ref 3.87–5.11)
RDW: 17.1 % — ABNORMAL HIGH (ref 11.5–15.5)
WBC: 11.1 10*3/uL — ABNORMAL HIGH (ref 4.0–10.5)

## 2019-07-16 LAB — COMPREHENSIVE METABOLIC PANEL
ALT: 31 U/L (ref 0–35)
AST: 25 U/L (ref 0–37)
Albumin: 4.4 g/dL (ref 3.5–5.2)
Alkaline Phosphatase: 108 U/L (ref 39–117)
BUN: 16 mg/dL (ref 6–23)
CO2: 25 mEq/L (ref 19–32)
Calcium: 9.9 mg/dL (ref 8.4–10.5)
Chloride: 98 mEq/L (ref 96–112)
Creatinine, Ser: 1.03 mg/dL (ref 0.40–1.20)
GFR: 64.97 mL/min (ref >60.00)
Glucose, Bld: 257 mg/dL — ABNORMAL HIGH (ref 70–99)
Potassium: 3.9 mEq/L (ref 3.5–5.1)
Sodium: 137 mEq/L (ref 135–145)
Total Bilirubin: 0.4 mg/dL (ref 0.2–1.2)
Total Protein: 7.9 g/dL (ref 6.0–8.3)

## 2019-07-16 LAB — HEMOGLOBIN A1C: Hgb A1c MFr Bld: 10.5 % — ABNORMAL HIGH (ref 4.6–6.5)

## 2019-07-16 LAB — CYCLIC CITRUL PEPTIDE ANTIBODY, IGG: Cyclic Citrullin Peptide Ab: <16 UNITS

## 2019-07-16 LAB — SEDIMENTATION RATE: Sed Rate: 130 mm/hr — ABNORMAL HIGH (ref 0–30)

## 2019-07-16 LAB — C-REACTIVE PROTEIN: CRP: <1 mg/dL (ref 0.5–20.0)

## 2019-07-16 MED ORDER — LANCETS 30G MISC: 1.0000 | Freq: Three times a day (TID) | 9 refills | Status: AC

## 2019-07-16 MED ORDER — CONTOUR NEXT TEST VI STRP: ORAL_STRIP | 11 refills | Status: DC

## 2019-07-16 NOTE — Telephone Encounter (Signed)
Received fax from [facility] stating Medicare utilization guidelines for test strips and lancets is for up to 3 times daily testing for insulin dependent patients. Allowed is 300 test strips and or lancets every 3 months. [facility] is requesting new RXs to meet those guidelines.

## 2019-07-16 NOTE — Telephone Encounter (Signed)
Done

## 2019-07-17 ENCOUNTER — Encounter: Payer: Self-pay | Admitting: Family Medicine

## 2019-07-17 ENCOUNTER — Telehealth: Payer: Self-pay | Admitting: Internal Medicine

## 2019-07-17 NOTE — Telephone Encounter (Signed)
FYI

## 2019-07-17 NOTE — Telephone Encounter (Signed)
-----   Message from Cloyd Stagers, MD sent at 07/17/2019  8:27 AM EDT ----- PLease bring this pt to see me sometime next week. Her sugar is out of control

## 2019-07-17 NOTE — Addendum Note (Signed)
Addended by: [name] on: 07/17/2019 06:47 AM   Modules accepted: Orders

## 2019-07-17 NOTE — Telephone Encounter (Signed)
Completed. July 20, 2019 @ 1 p.m.

## 2019-07-20 ENCOUNTER — Encounter: Payer: Self-pay | Admitting: Internal Medicine

## 2019-07-20 ENCOUNTER — Ambulatory Visit (INDEPENDENT_AMBULATORY_CARE_PROVIDER_SITE_OTHER): Payer: Medicare HMO | Admitting: Internal Medicine

## 2019-07-20 ENCOUNTER — Other Ambulatory Visit: Payer: Self-pay

## 2019-07-20 VITALS — BP 124/64 | HR 72 | Temp 98.6°F | Ht 63.0 in | Wt 246.4 lb

## 2019-07-20 DIAGNOSIS — Z794 Long term (current) use of insulin: Secondary | ICD-10-CM

## 2019-07-20 DIAGNOSIS — E1159 Type 2 diabetes mellitus with other circulatory complications: Secondary | ICD-10-CM | POA: Diagnosis not present

## 2019-07-20 DIAGNOSIS — E1165 Type 2 diabetes mellitus with hyperglycemia: Secondary | ICD-10-CM

## 2019-07-20 DIAGNOSIS — E1139 Type 2 diabetes mellitus with other diabetic ophthalmic complication: Secondary | ICD-10-CM

## 2019-07-20 MED ORDER — CONTOUR NEXT TEST VI STRP: ORAL_STRIP | 11 refills | Status: AC

## 2019-07-20 MED ORDER — BD PEN NEEDLE MINI U/F 31G X 5 MM MISC: 1 refills | Status: DC

## 2019-07-20 MED ORDER — INSULIN LISPRO (1 UNIT DIAL) 100 UNIT/ML (KWIKPEN): 10.0000 [IU] | PEN_INJECTOR | Freq: Every day | SUBCUTANEOUS | 11 refills | Status: DC

## 2019-07-20 MED ORDER — INSULIN PEN NEEDLE 31G X 5 MM MISC: 1.0000 | Freq: Three times a day (TID) | 11 refills | Status: DC

## 2019-07-20 MED ORDER — LANTUS SOLOSTAR 100 UNIT/ML ~~LOC~~ SOPN: 50.0000 [IU] | PEN_INJECTOR | Freq: Every day | SUBCUTANEOUS | 11 refills | Status: DC

## 2019-07-20 NOTE — Patient Instructions (Addendum)
-   Decrease Lantus to 50 units  - Start Novolog /humalog 10 units with Supper  - Continue Victoza 1.8 mg daily    HOW TO TREAT LOW BLOOD SUGARS (Blood sugar LESS THAN 70 MG/DL)  Please follow the RULE OF 15 for the treatment of hypoglycemia treatment (when your (blood sugars are less than 70 mg/dL)    STEP 1: Take 15 grams of carbohydrates when your blood sugar is low, which includes:   3-4 GLUCOSE TABS  OR  3-4 OZ OF JUICE OR REGULAR SODA OR  ONE TUBE OF GLUCOSE GEL     STEP 2: RECHECK blood sugar in 15 MINUTES STEP 3: If your blood sugar is still low at the 15 minute recheck --> then, go back to STEP 1 and treat AGAIN with another 15 grams of carbohydrates.

## 2019-07-20 NOTE — Progress Notes (Signed)
Name: Lynn [name]  Age/ Sex: 63 y.o., female   MRN/ DOB: [id number], 12/20/1955     PCP: [name], MD   Reason for Endocrinology Evaluation: Type 2 Diabetes Mellitus  Initial Endocrine Consultative Visit: 03/31/2019    PATIENT IDENTIFIER: Lynn [name] is a 63 y.o. female with a past medical history of T2DM, HTN, dyslipidemia,thalassemia, OSA on CPAP . The patient has followed with Endocrinology clinic since 03/31/2019 for consultative assistance with management of her diabetes.  DIABETIC HISTORY:  Lynn [name] was diagnosed with T2DM in 2010, She has been on metformin in the past with reported prior intolerance due to diarrhea. Was on Glipizide at some point with no side effects.Has been on insulin for years. Her hemoglobin A1c has ranged from  7.7% in 2018, peaking at 10.4% in 2020.  On her initial presentation to our clinic, her A1c was 10.4% , she was on Lantus and Victoza    [name] was tried in 03/2019 but developed severe dizziness .  SUBJECTIVE:   During the last visit (05/26/2019): Continued Lantus and Victoza  Today (07/20/2019): Lynn [name] is here for a follow up on diabetes management due to worsening glycemic control. She checks her blood sugars 1 times daily. The patient has not had hypoglycemic episodes since the last clinic visit.. Otherwise, the patient has not required any recent emergency interventions for hypoglycemia and has not had recent hospitalizations secondary to hyper or hypoglycemic episodes.   She eats 2 meals ( Lunch and supper) , snacks after supper on  ice cream  And smoothies   ROS: As per HPI and as detailed below: Review of Systems  Constitutional: Negative for fever and weight loss.  HENT: Negative for congestion and sore throat.   Cardiovascular: Negative for chest pain and palpitations.  Gastrointestinal: Negative for diarrhea and nausea.      HOME DIABETES REGIMEN:  Lantus 56 units daily - takes 60 units  Victoza 1.8 mg daily     METER DOWNLOAD SUMMARY: Date range evaluated: 9/6-10/02/2019 Fingerstick Blood Glucose Tests =34 Average Number Tests/Day = 1.1 Overall Mean FS Glucose = 169   BG Ranges: Low = 105 High = 287   Hypoglycemic Events/30 Days: BG < 50 = 0 Episodes of symptomatic severe hypoglycemia = 0     HISTORY:  Past Medical History:  Past Medical History:  Diagnosis Date  . Anginal pain ([place])   . Anxiety   . Arthritis    "knees, wrists, back, elbows" (07/03/2017)  . Clotting disorder ([place])    blood clot in eye 2016  . Coronary artery disease, non-occlusive    a. 11/2016 NSTEMI/Cath: LM nl, LAD 40p, 42md, D1/2 small, LCX 40ost, OM2/3 nl/small, RCA 35 ost/mid, RPDA/RPL small/nl.  . Depression   . Diastolic dysfunction    a. 11/2016 Echo: EF 55-60%, gr1 DD, sev calcified MV annulus, mildly dil LA.  Marland Kitchen Eye hemorrhage 01/2015   "right; resolved" (07/03/2017)  . Headache    "monthly" (07/03/2017)  . Heart murmur   . History of blood transfusion    "when I had an ectopic pregnancy"  . History of stomach ulcers   . Hyperlipidemia   . Hypertension   . Microcytic anemia   . OSA on CPAP    setting is unknown  . Pneumonia    "several times" (07/03/2017)  . Syncope 07/03/2017 X 2   called seizures but no medications  . Thalassemia    "my cells are sickle cell shaped but  I don't have sickle cell anemia" (07/03/2017)  . Type II diabetes mellitus ([place])    Past Surgical History:  Past Surgical History:  Procedure Laterality Date  . 48-Hour Monitor  03/18/2017   Sinus rhythm with sinus tachycardia (rate 58-134 BPM)multiple PVCs noted with couplets and bigeminy. One triplet. 7 runs of PAT ranging from 100-130 bpm. Longest was 33 beats.  Marland Kitchen BREAST BIOPSY Left   . CARDIAC CATHETERIZATION N/A 11/19/2016   Procedure: Left Heart Cath and Coronary Angiography;  Surgeon: Belva Crome, MD;  Location: [facility];  Service: Cardiovascular.    LM nl, LAD 40p, 38md, D1/2  small, LCX 40ost, OM2/3 nl/small, RCA 35 ost/mid, RPDA/RPL small/nl.  . CARPAL TUNNEL RELEASE Right 11/01/2014  . COLONOSCOPY    . DILATION AND CURETTAGE OF UTERUS    . ECTOPIC PREGNANCY SURGERY  X 2  . JOINT REPLACEMENT    . KNEE ARTHROSCOPY Left 11/2014  . TONSILLECTOMY    . TOTAL KNEE ARTHROPLASTY Right 02/18/2015   Procedure: RIGHT TOTAL KNEE ARTHROPLASTY;  Surgeon: Netta Cedars, MD;  Location: [facility];  Service: Orthopedics;  Laterality: Right;  . TOTAL KNEE ARTHROPLASTY Left 08/19/2015   Procedure: LEFT TOTAL KNEE ARTHROPLASTY;  Surgeon: Netta Cedars, MD;  Location: [facility];  Service: Orthopedics;  Laterality: Left;  . TRANSTHORACIC ECHOCARDIOGRAM  11/2016; 07/2017:   a) normal LV size, thickness and function. EF 55-60%. GR 1 DD.No RWMA severe mitral annular calcification, but no mitral stenosis. Mild LA dilation;; b) normal LV size and function.  EF 65-75%.  Unable to assess diastolic function.  Aortic sclerosis with no stenosis.  Moderate mitral stenosis? (Gradient 9 mmHg) -?  Mild-mod left atrial enlargement   . VAGINAL HYSTERECTOMY      Social History:  reports that she quit smoking about 4 years ago. Her smoking use included cigarettes. She has a 11.00 pack-year smoking history. She has never used smokeless tobacco. She reports current alcohol use. She reports that she does not use drugs. Family History:  Family History  Problem Relation Age of Onset  . Cancer Mother   . Diabetes Mother   . Hypertension Mother   . Hyperlipidemia Mother   . Cancer Father   . Hyperlipidemia Father   . Mental illness Sister   . Diabetes Sister   . Diabetes Maternal Grandmother   . Diabetes Maternal Grandfather   . Colon cancer Neg Hx   . Esophageal cancer Neg Hx   . Stomach cancer Neg Hx   . Rectal cancer Neg Hx      HOME MEDICATIONS: Allergies as of 07/20/2019      Reactions   Penicillins Hives   Childhood allergy Has patient had a PCN reaction causing immediate rash, facial/tongue/throat  swelling, SOB or lightheadedness with hypotension: Yes Has patient had a PCN reaction causing severe rash involving mucus membranes or skin necrosis: No Has patient had a PCN reaction that required hospitalization No Has patient had a PCN reaction occurring within the last 10 years: No If all of the above answers are "NO", then may proceed with Cephalosporin use.   Farxiga [dapagliflozin] Other (See Comments)   Dizzy and lethargic   Metformin And Related    Diarrhea, bleeding      Medication List       Accurate as of July 20, 2019  1:31 PM. If you have any questions, ask your nurse or doctor.        atorvastatin 80 MG tablet Commonly known as: LIPITOR  TAKE 1 TABLET(80 MG) BY MOUTH DAILY AT 6 PM   cetirizine 10 MG tablet Commonly known as: ZYRTEC Take 10 mg by mouth daily as needed for allergies.   Contour Next Test test strip Generic drug: glucose blood 3x daily What changed: additional instructions Changed by: Dorita Sciara, MD   folic acid 1 MG tablet Commonly known as: FOLVITE TAKE 2 TABLETS BY MOUTH EVERY DAY   hydrochlorothiazide 12.5 MG tablet Commonly known as: HYDRODIURIL Take one by mouth daily   insulin lispro 100 UNIT/ML KwikPen Commonly known as: HumaLOG KwikPen Inject 0.1 mLs (10 Units total) into the skin daily with supper. Started by: Dorita Sciara, MD   Insulin Pen Needle 31G X 5 MM Misc 1 Device by Does not apply route 3 (three) times daily. What changed: You were already taking a medication with the same name, and this prescription was added. Make sure you understand how and when to take each. Changed by: Dorita Sciara, MD   B-D UF III MINI PEN NEEDLES 31G X 5 MM Misc Generic drug: Insulin Pen Needle USE DAILY WITH VICTOZA AND LANTUS. What changed: Another medication with the same name was added. Make sure you understand how and when to take each. Changed by: Dorita Sciara, MD   Ipratropium-Albuterol 20-100  MCG/ACT Aers respimat Commonly known as: Combivent Respimat Inhale 1 puff into the lungs every 6 (six) hours as needed for wheezing or shortness of breath.   Lancets 30G Misc 1 Device by Does not apply route 3 (three) times daily.   Lantus SoloStar 100 UNIT/ML Solostar Pen Generic drug: Insulin Glargine Inject 56 Units into the skin daily. What changed: how much to take   losartan 50 MG tablet Commonly known as: COZAAR Take one by mouth daily   multivitamin with minerals Tabs tablet Take 1 tablet by mouth daily. Reported on 01/03/2016   PRESCRIPTION MEDICATION Inhale into the lungs at bedtime. CPAP   Victoza 18 MG/3ML Sopn Generic drug: liraglutide Inject 0.3 mLs (1.8 mg total) into the skin daily.        OBJECTIVE:   Vital Signs: BP 124/64 (BP Location: Left Arm, Patient Position: Sitting, Cuff Size: Normal)   Pulse 72   Temp 98.6 F (37 C)   Ht 5\' 3"  (1.6 m)   Wt 246 lb 6.4 oz (111.8 kg)   SpO2 98%   BMI 43.65 kg/m   Wt Readings from Last 3 Encounters:  07/20/19 246 lb 6.4 oz (111.8 kg)  07/15/19 245 lb (111.1 kg)  05/26/19 243 lb 12.8 oz (110.6 kg)     Exam: General: Pt appears well and is in NAD  Neck: General: Supple without adenopathy. Thyroid: Thyroid size normal.  No goiter or nodules appreciated. No thyroid bruit.  Lungs: Clear with good BS bilat with no rales, rhonchi, or wheezes  Heart: RRR with normal S1 and S2 and no gallops; no murmurs; no rub  Extremities: No pretibial edema. No tremor. Normal toes, with no tenderness or swelling   Skin: Normal texture and temperature to palpation.  Neuro: MS is good with appropriate affect, pt is alert and Ox3           DM foot exam: 03/31/2019  The skin of the feet is intact without sores or ulcerations. The pedal pulses are 2+ on right and 2+ on left. The sensation is intact to a screening 5.07, 10 gram monofilament bilaterally    DATA REVIEWED:  Lab Results  Component Value Date  HGBA1C 10.5  (H) 07/15/2019   HGBA1C 10.4 (H) 03/23/2019   HGBA1C 8.9 (H) 10/29/2018   Lab Results  Component Value Date   MICROALBUR <0.7 03/23/2019   LDLCALC 44 03/23/2019   CREATININE 1.03 07/15/2019   Lab Results  Component Value Date   MICRALBCREAT 1.4 03/23/2019     Lab Results  Component Value Date   CHOL 103 03/23/2019   HDL 45.80 03/23/2019   LDLCALC 44 03/23/2019   LDLDIRECT 131 (H) 09/02/2013   TRIG 66.0 03/23/2019   CHOLHDL 2 03/23/2019         ASSESSMENT / PLAN / RECOMMENDATIONS:   1) Type 2 Diabetes Mellitus, Poorly controlled, With retinopathy and macrovascular complications - Most recent A1c of 10.5 %. Goal A1c <7.0 %.    Plan:  - Pt's A1c continues to be elevated, her fasting glucose readings are mostly in the acceptable range but her bedtime readings are all > 200 mg/dL , this is partly due to excessive CHO intake at night.  - I have recommended that we add a prandial insulin with her supper since its the biggest meals of the day and proceed from there, she was encouraged the better she does with diet , the less medications she will need to be on .  - Discussed pharmacokinetics of basal/bolus insulin and the importance of taking prandial insulin with a meal.  We also discussed avoiding sugar-sweetened beverages and snacks, when possible.  - I also have advised her against increasing her lantus dose, due to the risk of hypoglycemia.    MEDICATIONS: - Decrease Lantus to 50  units daily  - Continue Victoza 1.8 mg daily  - Start Humalog 10 units with Supper   EDUCATION / INSTRUCTIONS:  BG monitoring instructions: Patient is instructed to check her blood sugars 3 times a day, fasting , supper and bedtime.  Call [facility] if: BG persistently < 70 or > 300. . I reviewed the Rule of 15 for the treatment of hypoglycemia in detail with the patient. Literature supplied.   F/U in 3 months    Signed electronically by: Mack Guise, MD   [facility]  [facility] [address] [place], [place] [address] Phone: [phone] FAX: [phone]   CC: [name], [address] [place] [address] Phone: [phone]  Fax: [phone]  Return to Endocrinology clinic as below: Future Appointments  Date Time Provider [place]  08/26/2019  3:00 PM Ata Pecha, Melanie Crazier, MD [id number] None

## 2019-07-22 DIAGNOSIS — R5382 Chronic fatigue, unspecified: Secondary | ICD-10-CM | POA: Diagnosis not present

## 2019-07-22 DIAGNOSIS — Z6841 Body Mass Index (BMI) 40.0 and over, adult: Secondary | ICD-10-CM | POA: Diagnosis not present

## 2019-07-22 DIAGNOSIS — M2550 Pain in unspecified joint: Secondary | ICD-10-CM | POA: Diagnosis not present

## 2019-07-24 ENCOUNTER — Encounter: Payer: Self-pay | Admitting: Internal Medicine

## 2019-07-27 ENCOUNTER — Other Ambulatory Visit: Payer: Self-pay

## 2019-07-27 MED ORDER — FREESTYLE LIBRE 14 DAY SENSOR MISC: 1.0000 | 6 refills | Status: DC

## 2019-08-11 DIAGNOSIS — Z6841 Body Mass Index (BMI) 40.0 and over, adult: Secondary | ICD-10-CM | POA: Diagnosis not present

## 2019-08-11 DIAGNOSIS — R5382 Chronic fatigue, unspecified: Secondary | ICD-10-CM | POA: Diagnosis not present

## 2019-08-11 DIAGNOSIS — M0579 Rheumatoid arthritis with rheumatoid factor of multiple sites without organ or systems involvement: Secondary | ICD-10-CM | POA: Diagnosis not present

## 2019-08-11 DIAGNOSIS — M2550 Pain in unspecified joint: Secondary | ICD-10-CM | POA: Diagnosis not present

## 2019-08-19 DIAGNOSIS — E119 Type 2 diabetes mellitus without complications: Secondary | ICD-10-CM | POA: Diagnosis not present

## 2019-08-19 DIAGNOSIS — H2513 Age-related nuclear cataract, bilateral: Secondary | ICD-10-CM | POA: Diagnosis not present

## 2019-08-19 LAB — HM DIABETES EYE EXAM

## 2019-08-24 ENCOUNTER — Other Ambulatory Visit: Payer: Self-pay

## 2019-08-25 ENCOUNTER — Encounter: Payer: Self-pay | Admitting: Family Medicine

## 2019-08-26 ENCOUNTER — Encounter: Payer: Self-pay | Admitting: Internal Medicine

## 2019-08-26 ENCOUNTER — Ambulatory Visit (INDEPENDENT_AMBULATORY_CARE_PROVIDER_SITE_OTHER): Payer: Medicare HMO | Admitting: Internal Medicine

## 2019-08-26 DIAGNOSIS — E1165 Type 2 diabetes mellitus with hyperglycemia: Secondary | ICD-10-CM | POA: Diagnosis not present

## 2019-08-26 DIAGNOSIS — Z794 Long term (current) use of insulin: Secondary | ICD-10-CM | POA: Diagnosis not present

## 2019-08-26 MED ORDER — LANTUS SOLOSTAR 100 UNIT/ML ~~LOC~~ SOPN: 54.0000 [IU] | PEN_INJECTOR | Freq: Every day | SUBCUTANEOUS | 11 refills | Status: DC

## 2019-08-26 MED ORDER — INSULIN LISPRO (1 UNIT DIAL) 100 UNIT/ML (KWIKPEN): 15.0000 [IU] | PEN_INJECTOR | Freq: Three times a day (TID) | SUBCUTANEOUS | 11 refills | Status: DC

## 2019-08-26 NOTE — Patient Instructions (Addendum)
-   INcrease  Lantus to 54 units  - Humalog 15 units with Breakfast, lunch and Dinner  - Continue Victoza 1.8 mg daily    HOW TO TREAT LOW BLOOD SUGARS (Blood sugar LESS THAN 70 MG/DL)  Please follow the RULE OF 15 for the treatment of hypoglycemia treatment (when your (blood sugars are less than 70 mg/dL)    STEP 1: Take 15 grams of carbohydrates when your blood sugar is low, which includes:   3-4 GLUCOSE TABS  OR  3-4 OZ OF JUICE OR REGULAR SODA OR  ONE TUBE OF GLUCOSE GEL     STEP 2: RECHECK blood sugar in 15 MINUTES STEP 3: If your blood sugar is still low at the 15 minute recheck --> then, go back to STEP 1 and treat AGAIN with another 15 grams of carbohydrates.

## 2019-08-26 NOTE — Progress Notes (Signed)
Name: Lynn [name]  Age/ Sex: 63 y.o., female   MRN/ DOB: [id number], [birth year]     PCP: [name], MD   Reason for Endocrinology Evaluation: Type 2 Diabetes Mellitus  Initial Endocrine Consultative Visit: 03/31/2019    PATIENT IDENTIFIER: Lynn [name] is a 63 y.o. female with a past medical history of T2DM, HTN, dyslipidemia,thalassemia, OSA on CPAP . The patient has followed with Endocrinology clinic since 03/31/2019 for consultative assistance with management of her diabetes.  DIABETIC HISTORY:  Lynn [name] was diagnosed with T2DM in 2010, She has been on metformin in the past with reported prior intolerance due to diarrhea. Was on Glipizide at some point with no side effects.Has been on insulin for years. Her hemoglobin A1c has ranged from  7.7% in 2018, peaking at 10.4% in 2020.  On her initial presentation to our clinic, her A1c was 10.4% , she was on Lantus and Victoza    [name] was tried in 03/2019 but developed severe dizziness .  Humalog started 07/20/2019 SUBJECTIVE:   During the last visit (07/20/2019): Continued Lantus and Victoza, started humalog.   Today (08/26/2019): Lynn [name] is here for a follow up on diabetes management due to worsening glycemic control. She checks her blood sugars multiple times daily through CGM. The patient has not had hypoglycemic episodes since the last clinic visit. Otherwise, the patient has not required any recent emergency interventions for hypoglycemia and has not had recent hospitalizations secondary to hyper or hypoglycemic episodes.   She eats 2 meals ( Lunch and supper) , snacks after supper , she admits to stress eating recently and has been eating cookies and popcorn.    She was recently seen by Leafy Kindle- NP for  RA, she was started on injectable MTX due to intolerance to oral MTX.    ROS: As per HPI and as detailed below: Review of Systems  Constitutional: Negative for fever and weight  loss.  HENT: Negative for congestion and sore throat.   Cardiovascular: Negative for chest pain and palpitations.  Gastrointestinal: Positive for nausea. Negative for diarrhea.  Musculoskeletal: Positive for joint pain.      HOME DIABETES REGIMEN:  Lantus 50 units daily  Victoza 1.8 mg daily  Humalog 10 units with supper     CONTINUOUS GLUCOSE MONITORING RECORD INTERPRETATION    Dates of Recording: 10/29-11/11/20  Sensor description:Freestyle libre  Results statistics:   CGM use % of time 83  Average and SD 221/29.5  Time in range   35     %  % Time Above 180 32  % Time above 250 33  % Time Below target 0    Glycemic patterns summary: Hyperglycemia all day and night but worse after breakfast   Hyperglycemic episodes  Worse after breakfast   Hypoglycemic episodes occurred N/A  Overnight periods: Initially high but drop to goal in the latter half of the night      HISTORY:  Past Medical History:  Past Medical History:  Diagnosis Date  . Anginal pain ([place])   . Anxiety   . Arthritis    "knees, wrists, back, elbows" (07/03/2017)  . Clotting disorder ([place])    blood clot in eye 2016  . Coronary artery disease, non-occlusive    a. 11/2016 NSTEMI/Cath: LM nl, LAD 40p, 48md, D1/2 small, LCX 40ost, OM2/3 nl/small, RCA 35 ost/mid, RPDA/RPL small/nl.  . Depression   . Diastolic dysfunction    a. 11/2016 Echo: EF 55-60%, gr1 DD, sev  calcified MV annulus, mildly dil LA.  Marland Kitchen Eye hemorrhage 01/2015   "right; resolved" (07/03/2017)  . Headache    "monthly" (07/03/2017)  . Heart murmur   . History of blood transfusion    "when I had an ectopic pregnancy"  . History of stomach ulcers   . Hyperlipidemia   . Hypertension   . Microcytic anemia   . OSA on CPAP    setting is unknown  . Pneumonia    "several times" (07/03/2017)  . Syncope 07/03/2017 X 2   called seizures but no medications  . Thalassemia    "my cells are sickle cell shaped but I don't have sickle cell  anemia" (07/03/2017)  . Type II diabetes mellitus ([place])    Past Surgical History:  Past Surgical History:  Procedure Laterality Date  . 48-Hour Monitor  03/18/2017   Sinus rhythm with sinus tachycardia (rate 58-134 BPM)multiple PVCs noted with couplets and bigeminy. One triplet. 7 runs of PAT ranging from 100-130 bpm. Longest was 33 beats.  Marland Kitchen BREAST BIOPSY Left   . CARDIAC CATHETERIZATION N/A 11/19/2016   Procedure: Left Heart Cath and Coronary Angiography;  Surgeon: Belva Crome, MD;  Location: [facility];  Service: Cardiovascular.    LM nl, LAD 40p, 28md, D1/2 small, LCX 40ost, OM2/3 nl/small, RCA 35 ost/mid, RPDA/RPL small/nl.  . CARPAL TUNNEL RELEASE Right 11/01/2014  . COLONOSCOPY    . DILATION AND CURETTAGE OF UTERUS    . ECTOPIC PREGNANCY SURGERY  X 2  . JOINT REPLACEMENT    . KNEE ARTHROSCOPY Left 11/2014  . TONSILLECTOMY    . TOTAL KNEE ARTHROPLASTY Right 02/18/2015   Procedure: RIGHT TOTAL KNEE ARTHROPLASTY;  Surgeon: Netta Cedars, MD;  Location: [facility];  Service: Orthopedics;  Laterality: Right;  . TOTAL KNEE ARTHROPLASTY Left 08/19/2015   Procedure: LEFT TOTAL KNEE ARTHROPLASTY;  Surgeon: Netta Cedars, MD;  Location: [facility];  Service: Orthopedics;  Laterality: Left;  . TRANSTHORACIC ECHOCARDIOGRAM  11/2016; 07/2017:   a) normal LV size, thickness and function. EF 55-60%. GR 1 DD.No RWMA severe mitral annular calcification, but no mitral stenosis. Mild LA dilation;; b) normal LV size and function.  EF 65-75%.  Unable to assess diastolic function.  Aortic sclerosis with no stenosis.  Moderate mitral stenosis? (Gradient 9 mmHg) -?  Mild-mod left atrial enlargement   . VAGINAL HYSTERECTOMY      Social History:  reports that she quit smoking about 4 years ago. Her smoking use included cigarettes. She has a 11.00 pack-year smoking history. She has never used smokeless tobacco. She reports current alcohol use. She reports that she does not use drugs. Family History:  Family History   Problem Relation Age of Onset  . Cancer Mother   . Diabetes Mother   . Hypertension Mother   . Hyperlipidemia Mother   . Cancer Father   . Hyperlipidemia Father   . Mental illness Sister   . Diabetes Sister   . Diabetes Maternal Grandmother   . Diabetes Maternal Grandfather   . Colon cancer Neg Hx   . Esophageal cancer Neg Hx   . Stomach cancer Neg Hx   . Rectal cancer Neg Hx      HOME MEDICATIONS: Allergies as of 08/26/2019      Reactions   Penicillins Hives   Childhood allergy Has patient had a PCN reaction causing immediate rash, facial/tongue/throat swelling, SOB or lightheadedness with hypotension: Yes Has patient had a PCN reaction causing severe rash involving mucus membranes  or skin necrosis: No Has patient had a PCN reaction that required hospitalization No Has patient had a PCN reaction occurring within the last 10 years: No If all of the above answers are "NO", then may proceed with Cephalosporin use.   Farxiga [dapagliflozin] Other (See Comments)   Dizzy and lethargic   Metformin And Related    Diarrhea, bleeding      Medication List       Accurate as of August 26, 2019  2:58 PM. If you have any questions, ask your nurse or doctor.        STOP taking these medications   Ipratropium-Albuterol 20-100 MCG/ACT Aers respimat Commonly known as: Combivent Respimat Stopped by: Dorita Sciara, MD     TAKE these medications   atorvastatin 80 MG tablet Commonly known as: LIPITOR TAKE 1 TABLET(80 MG) BY MOUTH DAILY AT 6 PM   cetirizine 10 MG tablet Commonly known as: ZYRTEC Take 10 mg by mouth daily as needed for allergies.   Contour Next Test test strip Generic drug: glucose blood 3x daily   folic acid 1 MG tablet Commonly known as: FOLVITE TAKE 2 TABLETS BY MOUTH EVERY DAY   FreeStyle Libre 14 Day Sensor Misc 1 Package by Does not apply route every 14 (fourteen) days.   hydrochlorothiazide 12.5 MG tablet Commonly known as: HYDRODIURIL  Take one by mouth daily   insulin lispro 100 UNIT/ML KwikPen Commonly known as: HumaLOG KwikPen Inject 0.1 mLs (10 Units total) into the skin daily with supper.   Insulin Pen Needle 31G X 5 MM Misc 1 Device by Does not apply route 3 (three) times daily.   B-D UF III MINI PEN NEEDLES 31G X 5 MM Misc Generic drug: Insulin Pen Needle USE DAILY WITH VICTOZA AND LANTUS.   Lancets 30G Misc 1 Device by Does not apply route 3 (three) times daily.   Lantus SoloStar 100 UNIT/ML Solostar Pen Generic drug: Insulin Glargine Inject 50 Units into the skin daily.   losartan 50 MG tablet Commonly known as: COZAAR Take one by mouth daily   methotrexate 50 MG/2ML injection   multivitamin with minerals Tabs tablet Take 1 tablet by mouth daily. Reported on 01/03/2016   PRESCRIPTION MEDICATION Inhale into the lungs at bedtime. CPAP   SAFETY-LOK TB SYRINGE 27GX.5" 27G X 1/2" 1 ML Misc Generic drug: TUBERCULIN SYR 1CC/27GX1/2"   Victoza 18 MG/3ML Sopn Generic drug: liraglutide Inject 0.3 mLs (1.8 mg total) into the skin daily.        OBJECTIVE:   Vital Signs: BP 107/64 (BP Location: Left Arm, Patient Position: Sitting, Cuff Size: Large)   Pulse 84   Ht 5\' 3"  (1.6 m)   Wt 246 lb 3.2 oz (111.7 kg)   LMP  (LMP Unknown)   SpO2 96%   BMI 43.61 kg/m   Wt Readings from Last 3 Encounters:  08/26/19 246 lb 3.2 oz (111.7 kg)  07/20/19 246 lb 6.4 oz (111.8 kg)  07/15/19 245 lb (111.1 kg)     Exam: General: Pt appears well and is in NAD  Neck: General: Supple without adenopathy. Thyroid: Thyroid size normal.  No goiter or nodules appreciated. No thyroid bruit.  Lungs: Clear with good BS bilat with no rales, rhonchi, or wheezes  Heart: RRR with normal S1 and S2 and no gallops; no murmurs; no rub  Extremities: No pretibial edema  Skin: Normal texture and temperature to palpation.  Neuro: MS is good with appropriate affect, pt is alert and Ox3  DM foot exam: 03/31/2019 (  deferred due to foot pain today)  The skin of the feet is intact without sores or ulcerations. The pedal pulses are 2+ on right and 2+ on left. The sensation is intact to a screening 5.07, 10 gram monofilament bilaterally    DATA REVIEWED:  Lab Results  Component Value Date   HGBA1C 10.5 (H) 07/15/2019   HGBA1C 10.4 (H) 03/23/2019   HGBA1C 8.9 (H) 10/29/2018   Lab Results  Component Value Date   MICROALBUR <0.7 03/23/2019   LDLCALC 44 03/23/2019   CREATININE 1.03 07/15/2019   Lab Results  Component Value Date   MICRALBCREAT 1.4 03/23/2019     Lab Results  Component Value Date   CHOL 103 03/23/2019   HDL 45.80 03/23/2019   LDLCALC 44 03/23/2019   LDLDIRECT 131 (H) 09/02/2013   TRIG 66.0 03/23/2019   CHOLHDL 2 03/23/2019         ASSESSMENT / PLAN / RECOMMENDATIONS:   1) Type 2 Diabetes Mellitus, Poorly controlled, With retinopathy and macrovascular complications - Most recent A1c of 10.5 %. Goal A1c <7.0 %.    Plan:  - Pt's A1c continues with hyperglycemia  - We counseled about the importance of low CHO diet  - She is intolerant to Metformin and SGLT_2 inhibitors - Will adjust insulin as below   MEDICATIONS: - Increase  Lantus to 54  units daily  - Continue Victoza 1.8 mg daily  - Increase Humalog to 15 units TODQAC   EDUCATION / INSTRUCTIONS:  BG monitoring instructions: Patient is instructed to check her blood sugars 3 times a day, fasting , supper and bedtime.  Call [facility] if: BG persistently < 70 or > 300. . I reviewed the Rule of 15 for the treatment of hypoglycemia in detail with the patient. Literature supplied.   F/U in 3 months    Signed electronically by: Mack Guise, MD  [facility] End[facility]inology  [facility] [address] [place], [place] [address] Phone: [phone] FAX: [phone]   CC: [name], [address] [place] [address] Phone:  [phone]  Fax: [phone]  Return to Endocrinology clinic as below: Future Appointments  Date Time Provider [place]  08/26/2019  3:00 PM Leonardo Plaia, Melanie Crazier, MD [facility] None

## 2019-09-22 DIAGNOSIS — R5382 Chronic fatigue, unspecified: Secondary | ICD-10-CM | POA: Diagnosis not present

## 2019-09-22 DIAGNOSIS — M0579 Rheumatoid arthritis with rheumatoid factor of multiple sites without organ or systems involvement: Secondary | ICD-10-CM | POA: Diagnosis not present

## 2019-09-22 DIAGNOSIS — M2550 Pain in unspecified joint: Secondary | ICD-10-CM | POA: Diagnosis not present

## 2019-09-22 DIAGNOSIS — Z6841 Body Mass Index (BMI) 40.0 and over, adult: Secondary | ICD-10-CM | POA: Diagnosis not present

## 2019-10-17 ENCOUNTER — Encounter: Payer: Self-pay | Admitting: Family Medicine

## 2019-10-18 ENCOUNTER — Other Ambulatory Visit: Payer: Self-pay | Admitting: Family Medicine

## 2019-11-03 DIAGNOSIS — M0579 Rheumatoid arthritis with rheumatoid factor of multiple sites without organ or systems involvement: Secondary | ICD-10-CM | POA: Diagnosis not present

## 2019-11-03 DIAGNOSIS — M2550 Pain in unspecified joint: Secondary | ICD-10-CM | POA: Diagnosis not present

## 2019-11-03 DIAGNOSIS — R5382 Chronic fatigue, unspecified: Secondary | ICD-10-CM | POA: Diagnosis not present

## 2019-11-05 DIAGNOSIS — M0579 Rheumatoid arthritis with rheumatoid factor of multiple sites without organ or systems involvement: Secondary | ICD-10-CM | POA: Diagnosis not present

## 2019-11-09 ENCOUNTER — Encounter: Payer: Self-pay | Admitting: Family Medicine

## 2019-11-09 DIAGNOSIS — M069 Rheumatoid arthritis, unspecified: Secondary | ICD-10-CM | POA: Insufficient documentation

## 2019-11-30 ENCOUNTER — Other Ambulatory Visit: Payer: Self-pay

## 2019-12-01 ENCOUNTER — Ambulatory Visit: Payer: Medicare Other | Admitting: Internal Medicine

## 2019-12-01 VITALS — BP 118/64 | HR 83 | Temp 98.5°F | Ht 63.0 in | Wt 241.4 lb

## 2019-12-01 DIAGNOSIS — Z794 Long term (current) use of insulin: Secondary | ICD-10-CM

## 2019-12-01 DIAGNOSIS — E1165 Type 2 diabetes mellitus with hyperglycemia: Secondary | ICD-10-CM

## 2019-12-01 LAB — POCT GLYCOSYLATED HEMOGLOBIN (HGB A1C): Hemoglobin A1C: 8.8 % — AB (ref 4.0–5.6)

## 2019-12-01 NOTE — Patient Instructions (Signed)
-   You have done an amazing work! Keep up the good work   - Continue  Lantus 54 units - Continue Victoza 1.8 mg daily    HOW TO TREAT LOW BLOOD SUGARS (Blood sugar LESS THAN 70 MG/DL)  Please follow the RULE OF 15 for the treatment of hypoglycemia treatment (when your (blood sugars are less than 70 mg/dL)    STEP 1: Take 15 grams of carbohydrates when your blood sugar is low, which includes:   3-4 GLUCOSE TABS  OR  3-4 OZ OF JUICE OR REGULAR SODA OR  ONE TUBE OF GLUCOSE GEL     STEP 2: RECHECK blood sugar in 15 MINUTES STEP 3: If your blood sugar is still low at the 15 minute recheck --> then, go back to STEP 1 and treat AGAIN with another 15 grams of carbohydrates.

## 2019-12-01 NOTE — Progress Notes (Signed)
Name: Sophronia Herrada  Age/ Sex: 64 y.o., female   MRN/ DOB: [id number], [birth year]     PCP: [name], MD   Reason for Endocrinology Evaluation: Type 2 Diabetes Mellitus  Initial Endocrine Consultative Visit: 03/31/2019    PATIENT IDENTIFIER: Ms. Amirykal Lardie is a 64 y.o. female with a past medical history of T2DM, HTN, dyslipidemia,thalassemia, OSA on CPAP . The patient has followed with Endocrinology clinic since 03/31/2019 for consultative assistance with management of her diabetes.  DIABETIC HISTORY:  Ms. Goshorn was diagnosed with T2DM in 2010, She has been on metformin in the past with reported prior intolerance due to diarrhea. Was on Glipizide at some point with no side effects.Has been on insulin for years. Her hemoglobin A1c has ranged from  7.7% in 2018, peaking at 10.4% in 2020.  On her initial presentation to our clinic, her A1c was 10.4% , she was on Lantus and Victoza    [name] was tried in 03/2019 but developed severe dizziness .     Injectable methotrexate was started November 2020 due to rheumatoid arthritis.  Patient is intolerant to oral methotrexate   SUBJECTIVE:   During the last visit (08/26/2019): A1c 10.5% we increase the Lantus as well as Humalog and continued Victoza.   Today (12/02/2019): Ms. Plano is here for a 60-month follow up on diabetes management She checks her blood sugars multiple times daily through CGM. The patient has not had hypoglycemic episodes since the last clinic visit. Otherwise, the patient has not required any recent emergency interventions for hypoglycemia and has not had recent hospitalizations secondary to hyper or hypoglycemic episodes.   The patient had made drastic lifestyle changes since her last visit here, she stopped eating cookies and popcorn, she has been eating foods and other healthier options.  She is doing well with the MTX injections, improved arthralgias recently.  ROS: As per HPI and  as detailed below: Review of Systems  Constitutional: Negative for fever and weight loss.  HENT: Negative for congestion and sore throat.   Cardiovascular: Negative for chest pain and palpitations.  Gastrointestinal: Negative for diarrhea and nausea.  Musculoskeletal: Positive for joint pain.      HOME DIABETES REGIMEN:  Lantus 54 units daily  Victoza 1.8 mg daily  Humalog 15 units with supper- not taking      CONTINUOUS GLUCOSE MONITORING RECORD INTERPRETATION    Dates of Recording: 1/17-1/30/2021  Sensor description:Freestyle libre  Results statistics:   CGM use % of time 64  Average and SD 162/32.8  Time in range   71   %  % Time Above 180 20  % Time above 250 9  % Time Below target 0    Glycemic patterns summary: Glucose within goal in the morning, hyperglycemia is noted after breakfast, but BG trends down towards goal, with another hyperglycemia after dinner   Hyperglycemic episodes  Post prandial  Hypoglycemic episodes occurred N/A  Overnight periods: stable     HISTORY:  Past Medical History:  Past Medical History:  Diagnosis Date  . Anginal pain ([place])   . Anxiety   . Arthritis    "knees, wrists, back, elbows" (07/03/2017)  . Clotting disorder ([place])    blood clot in eye 2016  . Coronary artery disease, non-occlusive    a. 11/2016 NSTEMI/Cath: LM nl, LAD 40p, 65md, D1/2 small, LCX 40ost, OM2/3 nl/small, RCA 35 ost/mid, RPDA/RPL small/nl.  . Depression   . Diastolic dysfunction    a. 11/2016 Echo:  EF 55-60%, gr1 DD, sev calcified MV annulus, mildly dil LA.  Marland Kitchen Eye hemorrhage 01/2015   "right; resolved" (07/03/2017)  . Headache    "monthly" (07/03/2017)  . Heart murmur   . History of blood transfusion    "when I had an ectopic pregnancy"  . History of stomach ulcers   . Hyperlipidemia   . Hypertension   . Microcytic anemia   . OSA on CPAP    setting is unknown  . Pneumonia    "several times" (07/03/2017)  . Syncope 07/03/2017 X 2   called seizures  but no medications  . Thalassemia    "my cells are sickle cell shaped but I don't have sickle cell anemia" (07/03/2017)  . Type II diabetes mellitus ([place])    Past Surgical History:  Past Surgical History:  Procedure Laterality Date  . 48-Hour Monitor  03/18/2017   Sinus rhythm with sinus tachycardia (rate 58-134 BPM)multiple PVCs noted with couplets and bigeminy. One triplet. 7 runs of PAT ranging from 100-130 bpm. Longest was 33 beats.  Marland Kitchen BREAST BIOPSY Left   . CARDIAC CATHETERIZATION N/A 11/19/2016   Procedure: Left Heart Cath and Coronary Angiography;  Surgeon: Belva Crome, MD;  Location: [facility];  Service: Cardiovascular.    LM nl, LAD 40p, 25md, D1/2 small, LCX 40ost, OM2/3 nl/small, RCA 35 ost/mid, RPDA/RPL small/nl.  . CARPAL TUNNEL RELEASE Right 11/01/2014  . COLONOSCOPY    . DILATION AND CURETTAGE OF UTERUS    . ECTOPIC PREGNANCY SURGERY  X 2  . JOINT REPLACEMENT    . KNEE ARTHROSCOPY Left 11/2014  . TONSILLECTOMY    . TOTAL KNEE ARTHROPLASTY Right 02/18/2015   Procedure: RIGHT TOTAL KNEE ARTHROPLASTY;  Surgeon: Netta Cedars, MD;  Location: [facility];  Service: Orthopedics;  Laterality: Right;  . TOTAL KNEE ARTHROPLASTY Left 08/19/2015   Procedure: LEFT TOTAL KNEE ARTHROPLASTY;  Surgeon: Netta Cedars, MD;  Location: [facility];  Service: Orthopedics;  Laterality: Left;  . TRANSTHORACIC ECHOCARDIOGRAM  11/2016; 07/2017:   a) normal LV size, thickness and function. EF 55-60%. GR 1 DD.No RWMA severe mitral annular calcification, but no mitral stenosis. Mild LA dilation;; b) normal LV size and function.  EF 65-75%.  Unable to assess diastolic function.  Aortic sclerosis with no stenosis.  Moderate mitral stenosis? (Gradient 9 mmHg) -?  Mild-mod left atrial enlargement   . VAGINAL HYSTERECTOMY      Social History:  reports that she quit smoking about 4 years ago. Her smoking use included cigarettes. She has a 11.00 pack-year smoking history. She has never used smokeless tobacco. She  reports current alcohol use. She reports that she does not use drugs. Family History:  Family History  Problem Relation Age of Onset  . Cancer Mother   . Diabetes Mother   . Hypertension Mother   . Hyperlipidemia Mother   . Cancer Father   . Hyperlipidemia Father   . Mental illness Sister   . Diabetes Sister   . Diabetes Maternal Grandmother   . Diabetes Maternal Grandfather   . Colon cancer Neg Hx   . Esophageal cancer Neg Hx   . Stomach cancer Neg Hx   . Rectal cancer Neg Hx      HOME MEDICATIONS: Allergies as of 12/01/2019      Reactions   Penicillins Hives   Childhood allergy Has patient had a PCN reaction causing immediate rash, facial/tongue/throat swelling, SOB or lightheadedness with hypotension: Yes Has patient had a PCN reaction causing  severe rash involving mucus membranes or skin necrosis: No Has patient had a PCN reaction that required hospitalization No Has patient had a PCN reaction occurring within the last 10 years: No If all of the above answers are "NO", then may proceed with Cephalosporin use.   Farxiga [dapagliflozin] Other (See Comments)   Dizzy and lethargic   Metformin And Related    Diarrhea, bleeding      Medication List       Accurate as of December 01, 2019 11:59 PM. If you have any questions, ask your nurse or doctor.        atorvastatin 80 MG tablet Commonly known as: LIPITOR TAKE 1 TABLET(80 MG) BY MOUTH DAILY AT 6 PM   cetirizine 10 MG tablet Commonly known as: ZYRTEC Take 10 mg by mouth daily as needed for allergies.   Contour Next Test test strip Generic drug: glucose blood 3x daily   folic acid 1 MG tablet Commonly known as: FOLVITE TAKE 2 TABLETS BY MOUTH EVERY DAY   FreeStyle Libre 14 Day Sensor Misc 1 Package by Does not apply route every 14 (fourteen) days.   hydrochlorothiazide 12.5 MG tablet Commonly known as: HYDRODIURIL Take one by mouth daily   insulin lispro 100 UNIT/ML KwikPen Commonly known as: HumaLOG  KwikPen Inject 0.15 mLs (15 Units total) into the skin 3 (three) times daily.   Insulin Pen Needle 31G X 5 MM Misc 1 Device by Does not apply route 3 (three) times daily.   B-D UF III MINI PEN NEEDLES 31G X 5 MM Misc Generic drug: Insulin Pen Needle USE DAILY WITH VICTOZA AND LANTUS.   Lancets 30G Misc 1 Device by Does not apply route 3 (three) times daily.   Lantus SoloStar 100 UNIT/ML Solostar Pen Generic drug: Insulin Glargine Inject 54 Units into the skin daily.   losartan 50 MG tablet Commonly known as: COZAAR Take one by mouth daily   methotrexate 50 MG/2ML injection   multivitamin with minerals Tabs tablet Take 1 tablet by mouth daily. Reported on 01/03/2016   PRESCRIPTION MEDICATION Inhale into the lungs at bedtime. CPAP   SAFETY-LOK TB SYRINGE 27GX.5" 27G X 1/2" 1 ML Misc Generic drug: TUBERCULIN SYR 1CC/27GX1/2"   Victoza 18 MG/3ML Sopn Generic drug: liraglutide Inject 0.3 mLs (1.8 mg total) into the skin daily.        OBJECTIVE:   Vital Signs: BP 118/64 (BP Location: Left Arm, Patient Position: Sitting, Cuff Size: Large)   Pulse 83   Temp 98.5 F (36.9 C)   Ht 5\' 3"  (1.6 m)   Wt 241 lb 6.4 oz (109.5 kg)   LMP  (LMP Unknown)   SpO2 98%   BMI 42.76 kg/m   Wt Readings from Last 3 Encounters:  12/01/19 241 lb 6.4 oz (109.5 kg)  08/26/19 246 lb 3.2 oz (111.7 kg)  07/20/19 246 lb 6.4 oz (111.8 kg)     Exam: General: Pt appears well and is in NAD  Neck: General: Supple without adenopathy. Thyroid: Thyroid size normal.  No goiter or nodules appreciated. No thyroid bruit.  Lungs: Clear with good BS bilat with no rales, rhonchi, or wheezes  Heart: RRR with normal S1 and S2 and no gallops; no murmurs; no rub  Extremities: No pretibial edema  Skin: Normal texture and temperature to palpation.  Neuro: MS is good with appropriate affect, pt is alert and Ox3           DM foot exam: 12/02/2019 The skin of  the feet is intact without sores or  ulcerations. The pedal pulses are 2+ on right and 2+ on left. The sensation is intact to a screening 5.07, 10 gram monofilament bilaterally    DATA REVIEWED:  Lab Results  Component Value Date   HGBA1C 8.8 (A) 12/01/2019   HGBA1C 10.5 (H) 07/15/2019   HGBA1C 10.4 (H) 03/23/2019   Lab Results  Component Value Date   MICROALBUR <0.7 03/23/2019   LDLCALC 44 03/23/2019   CREATININE 1.03 07/15/2019   Lab Results  Component Value Date   MICRALBCREAT 1.4 03/23/2019     Lab Results  Component Value Date   CHOL 103 03/23/2019   HDL 45.80 03/23/2019   LDLCALC 44 03/23/2019   LDLDIRECT 131 (H) 09/02/2013   TRIG 66.0 03/23/2019   CHOLHDL 2 03/23/2019         ASSESSMENT / PLAN / RECOMMENDATIONS:   1) Type 2 Diabetes Mellitus, Poorly controlled, With retinopathy and macrovascular complications - Most recent A1c of 8.8 %. Goal A1c <7.0 %.      -Great glycemic improvement, A1c down from 10.5% -I have praised her on the recent lifestyle changes, she has been encouraged to continue with this. -She has not used Humalog, the pharmacy was called co-pay was $70  - She is intolerant to Metformin and SGLT_2 inhibitors   MEDICATIONS: -Continue Lantus 54  units daily  -Continue Victoza 1.8 mg daily     EDUCATION / INSTRUCTIONS:  BG monitoring instructions: Patient is instructed to check her blood sugars 3 times a day, fasting , supper and bedtime.  Call [place] [facility] if: BG persistently < 70 or > 300. . I reviewed the Rule of 15 for the treatment of hypoglycemia in detail with the patient. Literature supplied.   F/U in 3 months    Signed electronically by: Mack Guise, MD  [facility] Endocrinology  [facility] [address] [place], [place] [address] Phone: [phone] FAX: [phone]   CC: [name], [address] [place] [address] Phone: [phone]  Fax: [phone]  Return  to Endocrinology clinic as below: Future Appointments  Date Time Provider [place]  02/29/2020  3:00 PM Jamilee Lafosse, Melanie Crazier, MD [facility] None

## 2019-12-02 ENCOUNTER — Encounter: Payer: Self-pay | Admitting: Internal Medicine

## 2019-12-07 ENCOUNTER — Telehealth: Payer: Self-pay | Admitting: Internal Medicine

## 2019-12-07 NOTE — Telephone Encounter (Signed)
Representative requests CMN chart notes. Rep states she faxed requests on 11/25/19 and also 12/01/19.

## 2019-12-07 NOTE — Telephone Encounter (Signed)
Never received any paperwork for this pt. However [place] paperwork was scanned into pt chart under media tab but it had not been signed, which meant it never came to me. I printed out the forms and placed them on Dr. Kelton Pillar desk for signature, then faxed them to # provided on forms.

## 2020-01-18 ENCOUNTER — Other Ambulatory Visit: Payer: Self-pay

## 2020-01-21 ENCOUNTER — Encounter: Payer: Self-pay | Admitting: Family Medicine

## 2020-01-21 ENCOUNTER — Ambulatory Visit (INDEPENDENT_AMBULATORY_CARE_PROVIDER_SITE_OTHER): Payer: Medicare Other | Admitting: Family Medicine

## 2020-01-21 ENCOUNTER — Other Ambulatory Visit: Payer: Self-pay

## 2020-01-21 VITALS — BP 107/75 | HR 72 | Temp 96.5°F | Resp 18 | Ht 63.0 in | Wt 242.0 lb

## 2020-01-21 DIAGNOSIS — I214 Non-ST elevation (NSTEMI) myocardial infarction: Secondary | ICD-10-CM | POA: Diagnosis not present

## 2020-01-21 DIAGNOSIS — M069 Rheumatoid arthritis, unspecified: Secondary | ICD-10-CM

## 2020-01-21 DIAGNOSIS — H1013 Acute atopic conjunctivitis, bilateral: Secondary | ICD-10-CM

## 2020-01-21 DIAGNOSIS — I10 Essential (primary) hypertension: Secondary | ICD-10-CM | POA: Diagnosis not present

## 2020-01-21 DIAGNOSIS — E1165 Type 2 diabetes mellitus with hyperglycemia: Secondary | ICD-10-CM | POA: Diagnosis not present

## 2020-01-21 DIAGNOSIS — E1169 Type 2 diabetes mellitus with other specified complication: Secondary | ICD-10-CM

## 2020-01-21 DIAGNOSIS — H6121 Impacted cerumen, right ear: Secondary | ICD-10-CM

## 2020-01-21 DIAGNOSIS — Z794 Long term (current) use of insulin: Secondary | ICD-10-CM

## 2020-01-21 DIAGNOSIS — E785 Hyperlipidemia, unspecified: Secondary | ICD-10-CM

## 2020-01-21 MED ORDER — VICTOZA 18 MG/3ML ~~LOC~~ SOPN: 1.8000 mg | PEN_INJECTOR | Freq: Every day | SUBCUTANEOUS | 11 refills | Status: DC

## 2020-01-21 MED ORDER — FOLIC ACID 1 MG PO TABS: 2.0000 mg | ORAL_TABLET | Freq: Every day | ORAL | 3 refills | Status: DC

## 2020-01-21 MED ORDER — LOSARTAN POTASSIUM 50 MG PO TABS: ORAL_TABLET | ORAL | 3 refills | Status: DC

## 2020-01-21 MED ORDER — ATORVASTATIN CALCIUM 80 MG PO TABS: ORAL_TABLET | ORAL | 3 refills | Status: DC

## 2020-01-21 MED ORDER — LANTUS SOLOSTAR 100 UNIT/ML ~~LOC~~ SOPN: 54.0000 [IU] | PEN_INJECTOR | Freq: Every day | SUBCUTANEOUS | 11 refills | Status: DC

## 2020-01-21 MED ORDER — OLOPATADINE HCL 0.2 % OP SOLN: 1.0000 [drp] | Freq: Every day | OPHTHALMIC | 2 refills | Status: DC

## 2020-01-21 MED ORDER — HYDROCHLOROTHIAZIDE 12.5 MG PO TABS: ORAL_TABLET | ORAL | 3 refills | Status: DC

## 2020-01-21 MED ORDER — INSULIN PEN NEEDLE 31G X 5 MM MISC: 1.0000 | Freq: Three times a day (TID) | 11 refills | Status: DC

## 2020-01-21 MED ORDER — DEXCOM G4 SENSOR MISC: 1.0000 | Freq: Every day | 3 refills | Status: DC

## 2020-01-21 MED ORDER — DEXCOM G5 RECEIVER KIT DEVI: 1.0000 | Freq: Every day | 1 refills | Status: DC

## 2020-01-21 NOTE — Patient Instructions (Addendum)
It was good to see you again today Please consider having the Shingrix vaccine at your convenience I sent in an antihistamine eye drop for you to try for your runny eyes- let me know if not helpful Otherwise will be in touch with your labs asap Assuming ok we can plan to visit in 6 months

## 2020-01-21 NOTE — Progress Notes (Signed)
[facility] [place], [place], [place] [address] [phone]  Date:  01/21/2020   Name:  Lynn [name]   DOB:  1955/11/21   MRN:  [id number]  PCP:  [name], MD    Chief Complaint: Diabetes (dexcon meter sent to pharmacy), Trouble Hearing (bilateral ears feel full), and Eyes Running   History of Present Illness:  Lynn [name] is a 64 y.o. very pleasant female patient who presents with the following:  Here today for a follow-up visit Patient with history of diabetes, hypertension, CAD and history of NSTEMI status post PCI, diastolic heart failure, COPD, sleep apnea on CPAP, obesity, hyperlipidemia, diagnosed with rheumatoid arthritis last fall  Last seen by myself in September for physical She sees endocrinology, Dr. Lewie Chamber are making good progress with her A1c, most recent 8.8% which is an improvement from 10.5% She would like to continue CBG but needs to change to [facility] brand Cardiologist is Dr. Ellyn Hack Pulmonologist is Dr. Melvyn Novas Rheumatology care through [facility] rheumatology-  Needs foot exam Covid series- done  Shingrix- mentioned to her today, she does not wish to do right now   Lipitor 80 HCTZ 12.5 Lantus Victoza Losartan Methotrexate injection weekly  Wt Readings from Last 3 Encounters:  01/21/20 242 lb (109.8 kg)  12/01/19 241 lb 6.4 oz (109.5 kg)  08/26/19 246 lb 3.2 oz (111.7 kg)    Patient Active Problem List   Diagnosis Date Noted  . Rheumatoid arthritis ([place]) 11/09/2019  . Toe pain, bilateral 05/26/2019  . Type 2 diabetes mellitus with hyperglycemia, with long-term current use of insulin ([place]) 05/26/2019  . Mobility impaired 04/01/2019  . Morbid obesity due to excess calories ([place]) 12/03/2017  . Dyspnea on exertion 12/02/2017  . Mitral stenosis 08/23/2017  . Diastolic heart failure ([place]) 08/12/2017  . Acute respiratory failure with hypoxia ([place])  08/12/2017  . OSA on CPAP 08/12/2017  . COPD with acute bronchitis ([place]) 08/12/2017  . Near syncope 07/03/2017  . Anxiety 07/03/2017  . Coronary artery disease, non-occlusive 12/08/2016  . PAT (paroxysmal atrial tachycardia) ([place])   . Demand myocardial infarction ([place]) 11/16/2016  . H/O total knee replacement 08/19/2015  . Vision, loss, sudden 03/07/2015  . S/P TKR (total knee replacement) using cement 02/18/2015  . Thalassemia 09/02/2013  . Hyperlipidemia with target low density lipoprotein (LDL) cholesterol less than 100 mg/dL 08/24/2012  . Essential hypertension 07/22/2012    Past Medical History:  Diagnosis Date  . Anginal pain ([place])   . Anxiety   . Arthritis    "knees, wrists, back, elbows" (07/03/2017)  . Clotting disorder ([place])    blood clot in eye 2016  . Coronary artery disease, non-occlusive    a. 11/2016 NSTEMI/Cath: LM nl, LAD 40p, 96m, D1/2 small, LCX 40ost, OM2/3 nl/small, RCA 35 ost/mid, RPDA/RPL small/nl.  . Depression   . Diastolic dysfunction    a. 11/2016 Echo: EF 55-60%, gr1 DD, sev calcified MV annulus, mildly dil LA.  .Marland KitchenEye hemorrhage 01/2015   "right; resolved" (07/03/2017)  . Headache    "monthly" (07/03/2017)  . Heart murmur   . History of blood transfusion    "when I had an ectopic pregnancy"  . History of stomach ulcers   . Hyperlipidemia   . Hypertension   . Microcytic anemia   . OSA on CPAP    setting is unknown  . Pneumonia    "several times" (07/03/2017)  . Syncope  07/03/2017 X 2   called seizures but no medications  . Thalassemia    "my cells are sickle cell shaped but I don't have sickle cell anemia" (07/03/2017)  . Type II diabetes mellitus ([place])     Past Surgical History:  Procedure Laterality Date  . 48-Hour Monitor  03/18/2017   Sinus rhythm with sinus tachycardia (rate 58-134 BPM)multiple PVCs noted with couplets and bigeminy. One triplet. 7 runs of PAT ranging from 100-130 bpm. Longest was 33 beats.  Marland Kitchen BREAST BIOPSY Left   .  CARDIAC CATHETERIZATION N/A 11/19/2016   Procedure: Left Heart Cath and Coronary Angiography;  Surgeon: Belva Crome, MD;  Location: [facility];  Service: Cardiovascular.    LM nl, LAD 40p, 51m, D1/2 small, LCX 40ost, OM2/3 nl/small, RCA 35 ost/mid, RPDA/RPL small/nl.  . CARPAL TUNNEL RELEASE Right 11/01/2014  . COLONOSCOPY    . DILATION AND CURETTAGE OF UTERUS    . ECTOPIC PREGNANCY SURGERY  X 2  . JOINT REPLACEMENT    . KNEE ARTHROSCOPY Left 11/2014  . TONSILLECTOMY    . TOTAL KNEE ARTHROPLASTY Right 02/18/2015   Procedure: RIGHT TOTAL KNEE ARTHROPLASTY;  Surgeon: SNetta Cedars MD;  [facility]  Service: Orthopedics;  Laterality: Right;  . TOTAL KNEE ARTHROPLASTY Left 08/19/2015   Procedure: LEFT TOTAL KNEE ARTHROPLASTY;  Surgeon: SNetta Cedars MD;  Location: [facility]  Service: Orthopedics;  Laterality: Left;  . TRANSTHORACIC ECHOCARDIOGRAM  11/2016; 07/2017:   a) normal LV size, thickness and function. EF 55-60%. GR 1 DD.No RWMA severe mitral annular calcification, but no mitral stenosis. Mild LA dilation;; b) normal LV size and function.  EF 65-75%.  Unable to assess diastolic function.  Aortic sclerosis with no stenosis.  Moderate mitral stenosis? (Gradient 9 mmHg) -?  Mild-mod left atrial enlargement   . VAGINAL HYSTERECTOMY      Social History   Tobacco Use  . Smoking status: Former Smoker    Packs/day: 0.25    Years: 44.00    Pack years: 11.00    Types: Cigarettes    Quit date: 02/17/2015    Years since quitting: 4.9  . Smokeless tobacco: Never Used  Substance Use Topics  . Alcohol use: Yes    Alcohol/week: 0.0 standard drinks    Comment: 07/03/2017 "might have a few drinks/year"  . Drug use: No    Family History  Problem Relation Age of Onset  . Cancer Mother   . Diabetes Mother   . Hypertension Mother   . Hyperlipidemia Mother   . Cancer Father   . Hyperlipidemia Father   . Mental illness Sister   . Diabetes Sister   . Diabetes Maternal Grandmother   .  Diabetes Maternal Grandfather   . Colon cancer Neg Hx   . Esophageal cancer Neg Hx   . Stomach cancer Neg Hx   . Rectal cancer Neg Hx     Allergies  Allergen Reactions  . Penicillins Hives    Childhood allergy Has patient had a PCN reaction causing immediate rash, facial/tongue/throat swelling, SOB or lightheadedness with hypotension: Yes Has patient had a PCN reaction causing severe rash involving mucus membranes or skin necrosis: No Has patient had a PCN reaction that required hospitalization No Has patient had a PCN reaction occurring within the last 10 years: No If all of the above answers are "NO", then may proceed with Cephalosporin use.  .[name][Dapagliflozin] Other (See Comments)    Dizzy and lethargic   . Metformin  And Related     Diarrhea, bleeding    Medication list has been reviewed and updated.  Current Outpatient Medications on File Prior to Visit  Medication Sig Dispense Refill  . cetirizine (ZYRTEC) 10 MG tablet Take 10 mg by mouth daily as needed for allergies.    Marland Kitchen glucose blood (CONTOUR NEXT TEST) test strip 3x daily 300 each 11  . Insulin Pen Needle (B-D UF III MINI PEN NEEDLES) 31G X 5 MM MISC USE DAILY WITH VICTOZA AND LANTUS. 400 each 1  . Lancets 30G MISC 1 Device by Does not apply route 3 (three) times daily. 300 each 9  . methotrexate 50 MG/2ML injection     . Multiple Vitamin (MULTIVITAMIN WITH MINERALS) TABS tablet Take 1 tablet by mouth daily. Reported on 01/03/2016    . PRESCRIPTION MEDICATION Inhale into the lungs at bedtime. CPAP    . SAFETY-LOK TB SYRINGE 27GX.5" 27G X 1/2" 1 ML MISC      No current facility-administered medications on file prior to visit.    Review of Systems:  As per HPI- otherwise negative.   Physical Examination: Vitals:   01/21/20 1415  BP: 107/75  Pulse: 72  Resp: 18  Temp: (!) 96.5 F (35.8 C)  SpO2: 99%   Vitals:   01/21/20 1415  Weight: 242 lb (109.8 kg)  Height: '5\' 3"'$  (1.6 m)   Body mass index is  42.87 kg/m. Ideal Body Weight: Weight in (lb) to have BMI = 25: 140.8  GEN: no acute distress.  Obese, looks well  HEENT: Atraumatic, Normocephalic.   Left TM normal, right ear has impacted cerumen Ears and Nose: No external deformity. CV: RRR, No M/G/R. No JVD. No thrill. No extra heart sounds. PULM: CTA B, no wheezes, crackles, rhonchi. No retractions. No resp. distress. No accessory muscle use. ABD: S, NT, ND, +BS. No rebound. No HSM. EXTR: No c/c/e PSYCH: Normally interactive. Conversant.  Foot exam- normal Cerumen in right ear only - this was irrigated and removed  Assessment and Plan: Rheumatoid arthritis, involving unspecified site, unspecified whether rheumatoid factor present ([place]) - Plan: folic acid (FOLVITE) 1 MG tablet  NSTEMI (non-ST elevated myocardial infarction) ([place]) - Plan: atorvastatin (LIPITOR) 80 MG tablet  Hyperlipidemia associated with type 2 diabetes mellitus ([place]) - Plan: atorvastatin (LIPITOR) 80 MG tablet  Essential hypertension - Plan: hydrochlorothiazide (HYDRODIURIL) 12.5 MG tablet, losartan (COZAAR) 50 MG tablet  Type 2 diabetes mellitus with hyperglycemia, with long-term current use of insulin ([place]) - Plan: insulin glargine (LANTUS SOLOSTAR) 100 UNIT/ML Solostar Pen, liraglutide (VICTOZA) 18 MG/3ML SOPN, Insulin Pen Needle 31G X 5 MM MISC, Continuous Blood Gluc Receiver (DEXCOM G5 RECEIVER KIT) DEVI, Continuous Blood Gluc Sensor (DEXCOM G4 SENSOR) MISC  Allergic conjunctivitis of both eyes - Plan: Olopatadine HCl 0.2 % SOLN  Impacted cerumen, right ear  Following up today- medications refilled Her glucose is coming under much better control- she is seeing endocrinology for one more viist coming up BP is under fine control She notes allergic conjunctivitis- will have her try pataday Removed cerumen from right ear which was causing her ear to feel clogged  Moderate medical decision making today This visit occurred during the SARS-CoV-2 public health  emergency.  Safety protocols were in place, including screening questions prior to the visit, additional usage of staff PPE, and extensive cleaning of exam room while observing appropriate contact time as indicated for disinfecting solutions.     Signed Lamar Blinks, MD

## 2020-01-25 ENCOUNTER — Other Ambulatory Visit: Payer: Self-pay

## 2020-01-25 ENCOUNTER — Encounter: Payer: Self-pay | Admitting: Cardiology

## 2020-01-25 ENCOUNTER — Ambulatory Visit: Payer: Medicare Other | Admitting: Cardiology

## 2020-01-25 VITALS — BP 132/82 | HR 75 | Temp 96.8°F | Ht 63.0 in | Wt 241.0 lb

## 2020-01-25 DIAGNOSIS — I10 Essential (primary) hypertension: Secondary | ICD-10-CM

## 2020-01-25 DIAGNOSIS — E785 Hyperlipidemia, unspecified: Secondary | ICD-10-CM

## 2020-01-25 DIAGNOSIS — I2510 Atherosclerotic heart disease of native coronary artery without angina pectoris: Secondary | ICD-10-CM

## 2020-01-25 DIAGNOSIS — I471 Supraventricular tachycardia: Secondary | ICD-10-CM

## 2020-01-25 DIAGNOSIS — I342 Nonrheumatic mitral (valve) stenosis: Secondary | ICD-10-CM

## 2020-01-25 DIAGNOSIS — I5032 Chronic diastolic (congestive) heart failure: Secondary | ICD-10-CM

## 2020-01-25 DIAGNOSIS — R0600 Dyspnea, unspecified: Secondary | ICD-10-CM

## 2020-01-25 DIAGNOSIS — R0609 Other forms of dyspnea: Secondary | ICD-10-CM

## 2020-01-25 DIAGNOSIS — Z794 Long term (current) use of insulin: Secondary | ICD-10-CM

## 2020-01-25 DIAGNOSIS — E1165 Type 2 diabetes mellitus with hyperglycemia: Secondary | ICD-10-CM

## 2020-01-25 MED ORDER — DILTIAZEM HCL ER COATED BEADS 180 MG PO CP24: 180.0000 mg | ORAL_CAPSULE | Freq: Every day | ORAL | 3 refills | Status: DC

## 2020-01-25 NOTE — Patient Instructions (Signed)
Medication Instructions:   START TAKING DILTIAZEM CD 180 MG - ONE TABLET AT BEDTIME DAILY   *If you need a refill on your cardiac medications before your next appointment, please call your pharmacy*   Lab Work: NOT NEEDED   Testing/Procedures: Schedule at [facility] has requested that you have an echocardiogram. Echocardiography is a painless test that uses sound waves to create images of your heart. It provides your doctor with information about the size and shape of your heart and how well your heart's chambers and valves are working. This procedure takes approximately one hour. There are no restrictions for this procedure.    Follow-Up: At [facility], you and your health needs are our priority.  As part of our continuing mission to provide you with exceptional heart care, we have created designated Provider Care Teams.  These Care Teams include your primary Cardiologist (physician) and Advanced Practice Providers (APPs -  Physician Assistants and Nurse Practitioners) who all work together to provide you with the care you need, when you need it.    Your next appointment:   6 month(s)  The format for your next appointment:   Either In Person or Virtual  Provider:   Glenetta Hew, MD   Other Instructions n/a
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
## 2020-01-26 ENCOUNTER — Encounter: Payer: Self-pay | Admitting: Cardiology

## 2020-01-26 ENCOUNTER — Encounter: Payer: Self-pay | Admitting: Family Medicine

## 2020-01-26 NOTE — Assessment & Plan Note (Addendum)
Seems to be relatively euvolemic.  No longer taking Lasix.  Edema seems to be well controlled with the HCTZ.  On losartan for afterload reduction.  We will start diltiazem in lieu of beta-blocker given preserved EF and desire for antianginal microvascular effect.  Depending what BP does with diltiazem, may want to increase to full 25 mg dose.  Plan: Start diltiazem CD 180 mg p.o., and check 2D echo

## 2020-01-26 NOTE — Assessment & Plan Note (Signed)
Was supposed to have an echocardiogram done late 2019 early 2020 -> this got delayed.  Now with worsening dyspnea, need to be assessed.  Plan: Recheck 2D echo

## 2020-01-26 NOTE — Assessment & Plan Note (Signed)
Borderline pressure today on ARB and HCTZ.  She does have room to add diltiazem as noted.

## 2020-01-26 NOTE — Assessment & Plan Note (Signed)
The patient understands the need to lose weight with diet and exercise. We have discussed specific strategies for this.  

## 2020-01-26 NOTE — Assessment & Plan Note (Signed)
These little spells may or may not be which related to her chest discomfort.  Difficult to tell.  But she has had less during the past few years.  No longer on beta-blocker.  We will start diltiazem for reasons noted elsewhere.

## 2020-01-26 NOTE — Assessment & Plan Note (Signed)
I think her chest pain episodes are pretty similar to what they were before.  She remains on ARB, but is not on anything for either palpitations or potential coronary spasm/microvascular disease.  Need to treat potential microvascular disease.  Plan: Add diltiazem CD 180 mg daily, consider Ranexa.

## 2020-01-26 NOTE — Assessment & Plan Note (Addendum)
A1c still not at goal.    Could consider SGLT2 inhibitor given her mild need for diuretic.

## 2020-01-26 NOTE — Assessment & Plan Note (Signed)
I think a lot of this has to do with deconditioning and weight, but there is some diastolic dysfunction component. There is also the concern of potential mitral disease.  Check 2D echo.

## 2020-01-26 NOTE — Assessment & Plan Note (Signed)
Should be due for having labs checked in the next month or 2 by PCP.  Lipids are well controlled in June 2020. Remains on stable high-dose of atorvastatin.  No real myalgias.

## 2020-01-27 ENCOUNTER — Other Ambulatory Visit: Payer: Self-pay

## 2020-01-28 MED ORDER — DEXCOM G6 RECEIVER DEVI: 1.0000 | 0 refills | Status: DC

## 2020-01-28 MED ORDER — DEXCOM G6 SENSOR MISC: 1.0000 | 0 refills | Status: DC

## 2020-02-03 DIAGNOSIS — M0579 Rheumatoid arthritis with rheumatoid factor of multiple sites without organ or systems involvement: Secondary | ICD-10-CM | POA: Diagnosis not present

## 2020-02-03 DIAGNOSIS — R5382 Chronic fatigue, unspecified: Secondary | ICD-10-CM | POA: Diagnosis not present

## 2020-02-03 DIAGNOSIS — M2550 Pain in unspecified joint: Secondary | ICD-10-CM | POA: Diagnosis not present

## 2020-02-09 ENCOUNTER — Ambulatory Visit (HOSPITAL_COMMUNITY): Payer: Medicare Other | Attending: Internal Medicine

## 2020-02-09 ENCOUNTER — Other Ambulatory Visit: Payer: Self-pay

## 2020-02-09 DIAGNOSIS — I5032 Chronic diastolic (congestive) heart failure: Secondary | ICD-10-CM

## 2020-02-09 DIAGNOSIS — R0600 Dyspnea, unspecified: Secondary | ICD-10-CM | POA: Diagnosis not present

## 2020-02-09 DIAGNOSIS — I342 Nonrheumatic mitral (valve) stenosis: Secondary | ICD-10-CM | POA: Diagnosis not present

## 2020-02-09 DIAGNOSIS — I2510 Atherosclerotic heart disease of native coronary artery without angina pectoris: Secondary | ICD-10-CM

## 2020-02-09 DIAGNOSIS — R0609 Other forms of dyspnea: Secondary | ICD-10-CM

## 2020-02-29 ENCOUNTER — Ambulatory Visit: Payer: Medicare Other | Admitting: Internal Medicine

## 2020-03-20 NOTE — Progress Notes (Addendum)
[facility] [address] [place], [place] [address] [phone]  Date:  03/21/2020   Name:  Lynn [name]   DOB:  Sep 12, 1956   MRN:  [id number]  PCP:  [name], MD    Chief Complaint: Diabetes (a1c check)   History of Present Illness:  Lynn [name] is a 64 y.o. very pleasant female patient who presents with the following:  Here today for routine follow-up-last seen by myself in April  History of diabetes, hypertension, CAD/MI, COPD, sleep apnea, dyslipidemia, rheumatoid arthritis diagnosed last year, thalassemia  Her diabetes is managed by endocrinology, Dr. Kelton Pillar.  She has made good progress with her A1c, can recheck for her today Rheumatology-[facility]; she is using methotrexate injection once a week.  This does seem to be helping but did cause her to lose her hair; however she notes that the improvement in her anxiety symptoms is enough that she can deal with the hair loss  Covid series is complete She saw her cardiologist, Glenetta Hew, in April as well-at that time he started diltiazem  Patient Active Problem List   Diagnosis Date Noted  . Rheumatoid arthritis ([place]) 11/09/2019  . Toe pain, bilateral 05/26/2019  . Type 2 diabetes mellitus with hyperglycemia, with long-term current use of insulin ([place]) 05/26/2019  . Mobility impaired 04/01/2019  . Morbid obesity due to excess calories ([place]) 12/03/2017  . Dyspnea on exertion 12/02/2017  . Mitral stenosis 08/23/2017  . Diastolic heart failure ([place]) 08/12/2017  . Acute respiratory failure with hypoxia ([place]) 08/12/2017  . OSA on CPAP 08/12/2017  . COPD with acute bronchitis ([place]) 08/12/2017  . Near syncope 07/03/2017  . Anxiety 07/03/2017  . Coronary artery disease, non-occlusive 12/08/2016  . PAT (paroxysmal atrial tachycardia) ([place])   . Demand myocardial infarction ([place]) 11/16/2016  . H/O total knee  replacement 08/19/2015  . Vision, loss, sudden 03/07/2015  . S/P TKR (total knee replacement) using cement 02/18/2015  . Thalassemia 09/02/2013  . Hyperlipidemia with target low density lipoprotein (LDL) cholesterol less than 100 mg/dL 08/24/2012  . Essential hypertension 07/22/2012    Past Medical History:  Diagnosis Date  . Anginal pain ([place])   . Anxiety   . Arthritis    "knees, wrists, back, elbows" (07/03/2017)  . Clotting disorder ([place])    blood clot in eye 2016  . Coronary artery disease, non-occlusive    a. 11/2016 NSTEMI/Cath: LM nl, LAD 40p, 64md, D1/2 small, LCX 40ost, OM2/3 nl/small, RCA 35 ost/mid, RPDA/RPL small/nl.  . Depression   . Diastolic dysfunction    a. 11/2016 Echo: EF 55-60%, gr1 DD, sev calcified MV annulus, mildly dil LA.  Marland Kitchen Eye hemorrhage 01/2015   "right; resolved" (07/03/2017)  . Headache    "monthly" (07/03/2017)  . Heart murmur   . History of blood transfusion    "when I had an ectopic pregnancy"  . History of stomach ulcers   . Hyperlipidemia   . Hypertension   . Microcytic anemia   . OSA on CPAP    setting is unknown  . Pneumonia    "several times" (07/03/2017)  . Syncope 07/03/2017 X 2   called seizures but no medications  . Thalassemia    "my cells are sickle cell shaped but I don't have sickle cell anemia" (07/03/2017)  . Type II diabetes mellitus ([place])     Past Surgical History:  Procedure Laterality Date  . 48-Hour Monitor  03/18/2017  Sinus rhythm with sinus tachycardia (rate 58-134 BPM)multiple PVCs noted with couplets and bigeminy. One triplet. 7 runs of PAT ranging from 100-130 bpm. Longest was 33 beats.  Marland Kitchen BREAST BIOPSY Left   . CARDIAC CATHETERIZATION N/A 11/19/2016   Procedure: Left Heart Cath and Coronary Angiography;  Surgeon: Belva Crome, MD;  Location: [facility];  Service: Cardiovascular.    LM nl, LAD 40p, 24md, D1/2 small, LCX 40ost, OM2/3 nl/small, RCA 35 ost/mid, RPDA/RPL small/nl.  . CARPAL TUNNEL RELEASE Right  11/01/2014  . COLONOSCOPY    . DILATION AND CURETTAGE OF UTERUS    . ECTOPIC PREGNANCY SURGERY  X 2  . JOINT REPLACEMENT    . KNEE ARTHROSCOPY Left 11/2014  . TONSILLECTOMY    . TOTAL KNEE ARTHROPLASTY Right 02/18/2015   Procedure: RIGHT TOTAL KNEE ARTHROPLASTY;  Surgeon: Netta Cedars, MD;  Location: [facility];  Service: Orthopedics;  Laterality: Right;  . TOTAL KNEE ARTHROPLASTY Left 08/19/2015   Procedure: LEFT TOTAL KNEE ARTHROPLASTY;  Surgeon: Netta Cedars, MD;  Location: [facility];  Service: Orthopedics;  Laterality: Left;  . TRANSTHORACIC ECHOCARDIOGRAM  11/2016; 07/2017:   a) normal LV size, thickness and function. EF 55-60%. GR 1 DD.No RWMA severe mitral annular calcification, but no mitral stenosis. Mild LA dilation;; b) normal LV size and function.  EF 65-75%.  Unable to assess diastolic function.  Aortic sclerosis with no stenosis.  Moderate mitral stenosis? (Gradient 9 mmHg) -?  Mild-mod left atrial enlargement   . VAGINAL HYSTERECTOMY      Social History   Tobacco Use  . Smoking status: Former Smoker    Packs/day: 0.25    Years: 44.00    Pack years: 11.00    Types: Cigarettes    Quit date: 02/17/2015    Years since quitting: 5.0  . Smokeless tobacco: Never Used  Substance Use Topics  . Alcohol use: Yes    Alcohol/week: 0.0 standard drinks    Comment: 07/03/2017 "might have a few drinks/year"  . Drug use: No    Family History  Problem Relation Age of Onset  . Cancer Mother   . Diabetes Mother   . Hypertension Mother   . Hyperlipidemia Mother   . Cancer Father   . Hyperlipidemia Father   . Mental illness Sister   . Diabetes Sister   . Diabetes Maternal Grandmother   . Diabetes Maternal Grandfather   . Colon cancer Neg Hx   . Esophageal cancer Neg Hx   . Stomach cancer Neg Hx   . Rectal cancer Neg Hx     Allergies  Allergen Reactions  . Penicillins Hives    Childhood allergy Has patient had a PCN reaction causing immediate rash, facial/tongue/throat swelling, SOB  or lightheadedness with hypotension: Yes Has patient had a PCN reaction causing severe rash involving mucus membranes or skin necrosis: No Has patient had a PCN reaction that required hospitalization No Has patient had a PCN reaction occurring within the last 10 years: No If all of the above answers are "NO", then may proceed with Cephalosporin use.  [name] [Dapagliflozin] Other (See Comments)    Dizzy and lethargic   . Metformin And Related     Diarrhea, bleeding    Medication list has been reviewed and updated.  Current Outpatient Medications on File Prior to Visit  Medication Sig Dispense Refill  . atorvastatin (LIPITOR) 80 MG tablet TAKE 1 TABLET(80 MG) BY MOUTH DAILY AT 6 PM 90 tablet 3  . cetirizine (ZYRTEC)  10 MG tablet Take 10 mg by mouth daily as needed for allergies.    Marland Kitchen diltiazem (CARDIZEM CD) 180 MG 24 hr capsule Take 1 capsule (180 mg total) by mouth at bedtime. 90 capsule 3  . folic acid (FOLVITE) 1 MG tablet Take 2 tablets (2 mg total) by mouth daily. 180 tablet 3  . glucose blood (CONTOUR NEXT TEST) test strip 3x daily 300 each 11  . hydrochlorothiazide (HYDRODIURIL) 12.5 MG tablet Take one by mouth daily 90 tablet 3  . insulin glargine (LANTUS SOLOSTAR) 100 UNIT/ML Solostar Pen Inject 54 Units into the skin daily. 15 mL 11  . Insulin Pen Needle (B-D UF III MINI PEN NEEDLES) 31G X 5 MM MISC USE DAILY WITH VICTOZA AND LANTUS. 400 each 1  . Insulin Pen Needle 31G X 5 MM MISC 1 Device by Does not apply route 3 (three) times daily. 150 each 11  . Lancets 30G MISC 1 Device by Does not apply route 3 (three) times daily. 300 each 9  . liraglutide (VICTOZA) 18 MG/3ML SOPN Inject 0.3 mLs (1.8 mg total) into the skin daily. 4 mL 11  . losartan (COZAAR) 50 MG tablet Take one by mouth daily 90 tablet 3  . methotrexate 50 MG/2ML injection     . Multiple Vitamin (MULTIVITAMIN WITH MINERALS) TABS tablet Take 1 tablet by mouth daily. Reported on 01/03/2016    . Olopatadine HCl 0.2 %  SOLN Apply 1 drop to eye daily. Apply to both eyes as needed for allergies 2.5 mL 2  . PRESCRIPTION MEDICATION Inhale into the lungs at bedtime. CPAP    . SAFETY-LOK TB SYRINGE 27GX.5" 27G X 1/2" 1 ML MISC      No current facility-administered medications on file prior to visit.    Review of Systems:  As per HPI- otherwise negative.   Physical Examination: Vitals:   03/21/20 1421  BP: 130/78  Pulse: 78  Resp: 18  Temp: (!) 97.3 F (36.3 C)  SpO2: 97%   Vitals:   03/21/20 1421  Weight: 242 lb (109.8 kg)  Height: 5\' 3"  (1.6 m)   Body mass index is 42.87 kg/m. Ideal Body Weight: Weight in (lb) to have BMI = 25: 140.8  GEN: no acute distress.  Obese, looks well  HEENT: Atraumatic, Normocephalic.  Ears and Nose: No external deformity. CV: RRR, No M/G/R. No JVD. No thrill. No extra heart sounds. PULM: CTA B, no wheezes, crackles, rhonchi. No retractions. No resp. distress. No accessory muscle use. EXTR: No c/c/e PSYCH: Normally interactive. Conversant.    Assessment and Plan: Essential hypertension  Type 2 diabetes mellitus with hyperglycemia, with long-term current use of insulin ([place]) - Plan: Hemoglobin A1c   Here today for follow-up visit.  Blood pressures well controlled with addition of diltiazem.  Will check A1c today and be in touch with these results  Her rheumatoid arthritis is making good progress with methotrexate as per rheumatology  Plan to recheck in 6 months This visit occurred during the SARS-CoV-2 public health emergency.  Safety protocols were in place, including screening questions prior to the visit, additional usage of staff PPE, and extensive cleaning of exam room while observing appropriate contact time as indicated for disinfecting solutions.    Signed Lamar Blinks, MD  Received her A1c- it has gone up. Message to pt, will ask her to consult with her endocrinologist  JC

## 2020-03-21 ENCOUNTER — Encounter: Payer: Self-pay | Admitting: Family Medicine

## 2020-03-21 ENCOUNTER — Other Ambulatory Visit: Payer: Self-pay

## 2020-03-21 ENCOUNTER — Ambulatory Visit (INDEPENDENT_AMBULATORY_CARE_PROVIDER_SITE_OTHER): Payer: Medicare Other | Admitting: Family Medicine

## 2020-03-21 VITALS — BP 130/78 | HR 78 | Temp 97.3°F | Resp 18 | Ht 63.0 in | Wt 242.0 lb

## 2020-03-21 DIAGNOSIS — E1165 Type 2 diabetes mellitus with hyperglycemia: Secondary | ICD-10-CM

## 2020-03-21 DIAGNOSIS — I10 Essential (primary) hypertension: Secondary | ICD-10-CM | POA: Diagnosis not present

## 2020-03-21 DIAGNOSIS — Z794 Long term (current) use of insulin: Secondary | ICD-10-CM | POA: Diagnosis not present

## 2020-03-21 LAB — HEMOGLOBIN A1C: Hgb A1c MFr Bld: 9.8 % — ABNORMAL HIGH (ref 4.6–6.5)

## 2020-03-21 NOTE — Patient Instructions (Addendum)
It was good to see you again today- I will be in touch with your A1c results asap!

## 2020-03-22 ENCOUNTER — Encounter: Payer: Self-pay | Admitting: Family Medicine

## 2020-04-08 ENCOUNTER — Ambulatory Visit
Admission: EM | Admit: 2020-04-08 | Discharge: 2020-04-08 | Disposition: A | Discharge status: Home / Self Care | Payer: Medicare Other

## 2020-04-08 ENCOUNTER — Telehealth: Payer: Medicare Other

## 2020-04-08 DIAGNOSIS — H1132 Conjunctival hemorrhage, left eye: Secondary | ICD-10-CM | POA: Diagnosis not present

## 2020-04-08 NOTE — Discharge Instructions (Signed)
No alarming signs on exam. This can be caused by straining, coughing, sneezing. Avoid scratching eyes. Monitor for any worsening of symptoms, changes in vision, sensitivity to light, eye swelling, painful eye movement, redness getting larger/going towards the pupil, follow up with ophthalmology for further evaluation.

## 2020-04-08 NOTE — ED Provider Notes (Signed)
[facility]    CSN: [id number] Arrival date & time: 04/08/20  1448      History   Chief Complaint Chief Complaint  Patient presents with  . Eye Problem    HPI Lynn [name] is a 63 y.o. female.   64 year old female with history of DM, CAD, HLD, HTN comes in for left eye redness spouse noticed this morning. Denies any pain, itching, injury. Denies vision changes, photophobia, eye drainage. Does have baseline allergies, and has been sneezing. No other straining such as cough, heavy lifting.  Last dilated exam 06/2019.      Past Medical History:  Diagnosis Date  . Anginal pain ([place])   . Anxiety   . Arthritis    "knees, wrists, back, elbows" (07/03/2017)  . Clotting disorder ([place])    blood clot in eye 2016  . Coronary artery disease, non-occlusive    a. 11/2016 NSTEMI/Cath: LM nl, LAD 40p, 41md, D1/2 small, LCX 40ost, OM2/3 nl/small, RCA 35 ost/mid, RPDA/RPL small/nl.  . Depression   . Diastolic dysfunction    a. 11/2016 Echo: EF 55-60%, gr1 DD, sev calcified MV annulus, mildly dil LA.  Marland Kitchen Eye hemorrhage 01/2015   "right; resolved" (07/03/2017)  . Headache    "monthly" (07/03/2017)  . Heart murmur   . History of blood transfusion    "when I had an ectopic pregnancy"  . History of stomach ulcers   . Hyperlipidemia   . Hypertension   . Microcytic anemia   . OSA on CPAP    setting is unknown  . Pneumonia    "several times" (07/03/2017)  . Syncope 07/03/2017 X 2   called seizures but no medications  . Thalassemia    "my cells are sickle cell shaped but I don't have sickle cell anemia" (07/03/2017)  . Type II diabetes mellitus [facility])     Patient Active Problem List   Diagnosis Date Noted  . Rheumatoid arthritis ([place]) 11/09/2019  . Toe pain, bilateral 05/26/2019  . Type 2 diabetes mellitus with hyperglycemia, with long-term current use of insulin ([place]) 05/26/2019  . Mobility impaired 04/01/2019  . Morbid obesity due to excess calories ([place])  12/03/2017  . Dyspnea on exertion 12/02/2017  . Mitral stenosis 08/23/2017  . Diastolic heart failure ([place]) 08/12/2017  . Acute respiratory failure with hypoxia ([place]) 08/12/2017  . OSA on CPAP 08/12/2017  . COPD with acute bronchitis ([place]) 08/12/2017  . Near syncope 07/03/2017  . Anxiety 07/03/2017  . Coronary artery disease, non-occlusive 12/08/2016  . PAT (paroxysmal atrial tachycardia) ([place])   . Demand myocardial infarction ([place]) 11/16/2016  . H/O total knee replacement 08/19/2015  . Vision, loss, sudden 03/07/2015  . S/P TKR (total knee replacement) using cement 02/18/2015  . Thalassemia 09/02/2013  . Hyperlipidemia with target low density lipoprotein (LDL) cholesterol less than 100 mg/dL 08/24/2012  . Essential hypertension 07/22/2012    Past Surgical History:  Procedure Laterality Date  . 48-Hour Monitor  03/18/2017   Sinus rhythm with sinus tachycardia (rate 58-134 BPM)multiple PVCs noted with couplets and bigeminy. One triplet. 7 runs of PAT ranging from 100-130 bpm. Longest was 33 beats.  Marland Kitchen BREAST BIOPSY Left   . CARDIAC CATHETERIZATION N/A 11/19/2016   Procedure: Left Heart Cath and Coronary Angiography;  Surgeon: Belva Crome, MD;  Location: [facility];  Service: Cardiovascular.    LM nl, LAD 40p, 65md, D1/2 small, LCX 40ost, OM2/3 nl/small, RCA 35 ost/mid, RPDA/RPL small/nl.  . CARPAL TUNNEL RELEASE  Right 11/01/2014  . COLONOSCOPY    . DILATION AND CURETTAGE OF UTERUS    . ECTOPIC PREGNANCY SURGERY  X 2  . JOINT REPLACEMENT    . KNEE ARTHROSCOPY Left 11/2014  . TONSILLECTOMY    . TOTAL KNEE ARTHROPLASTY Right 02/18/2015   Procedure: RIGHT TOTAL KNEE ARTHROPLASTY;  Surgeon: Netta Cedars, MD;  Location: [facility];  Service: Orthopedics;  Laterality: Right;  . TOTAL KNEE ARTHROPLASTY Left 08/19/2015   Procedure: LEFT TOTAL KNEE ARTHROPLASTY;  Surgeon: Netta Cedars, MD;  Location: [facility];  Service: Orthopedics;  Laterality: Left;  . TRANSTHORACIC ECHOCARDIOGRAM  11/2016;  07/2017:   a) normal LV size, thickness and function. EF 55-60%. GR 1 DD.No RWMA severe mitral annular calcification, but no mitral stenosis. Mild LA dilation;; b) normal LV size and function.  EF 65-75%.  Unable to assess diastolic function.  Aortic sclerosis with no stenosis.  Moderate mitral stenosis? (Gradient 9 mmHg) -?  Mild-mod left atrial enlargement   . VAGINAL HYSTERECTOMY      OB History   No obstetric history on file.      Home Medications    Prior to Admission medications   Medication Sig Start Date End Date Taking? Authorizing Provider  atorvastatin (LIPITOR) 80 MG tablet TAKE 1 TABLET(80 MG) BY MOUTH DAILY AT 6 PM 01/21/20   Copland, Gay Filler, MD  cetirizine (ZYRTEC) 10 MG tablet Take 10 mg by mouth daily as needed for allergies.    [provider]  diltiazem (CARDIZEM CD) 180 MG 24 hr capsule Take 1 capsule (180 mg total) by mouth at bedtime. 01/25/20 04/24/20  Leonie Man, MD  folic acid (FOLVITE) 1 MG tablet Take 2 tablets (2 mg total) by mouth daily. 01/21/20   Copland, Gay Filler, MD  glucose blood (CONTOUR NEXT TEST) test strip 3x daily 07/20/19   [name], Melanie Crazier, MD  hydrochlorothiazide (HYDRODIURIL) 12.5 MG tablet Take one by mouth daily 01/21/20   Copland, Gay Filler, MD  insulin glargine (LANTUS SOLOSTAR) 100 UNIT/ML Solostar Pen Inject 54 Units into the skin daily. 01/21/20   Copland, Gay Filler, MD  Insulin Pen Needle (B-D UF III MINI PEN NEEDLES) 31G X 5 MM MISC USE DAILY WITH VICTOZA AND LANTUS. 07/20/19   [name], Melanie Crazier, MD  Insulin Pen Needle 31G X 5 MM MISC 1 Device by Does not apply route 3 (three) times daily. 01/21/20   Copland, Gay Filler, MD  Lancets 30G MISC 1 Device by Does not apply route 3 (three) times daily. 07/16/19   Copland, Gay Filler, MD  liraglutide (VICTOZA) 18 MG/3ML SOPN Inject 0.3 mLs (1.8 mg total) into the skin daily. 01/21/20   Copland, Gay Filler, MD  losartan (COZAAR) 50 MG tablet Take one by mouth daily 01/21/20    Copland, Gay Filler, MD  methotrexate 50 MG/2ML injection  08/11/19   [provider]  Multiple Vitamin (MULTIVITAMIN WITH MINERALS) TABS tablet Take 1 tablet by mouth daily. Reported on 01/03/2016    [provider]  Olopatadine HCl 0.2 % SOLN Apply 1 drop to eye daily. Apply to both eyes as needed for allergies 01/21/20   Copland, Gay Filler, MD  PRESCRIPTION MEDICATION Inhale into the lungs at bedtime. CPAP    [provider]  SAFETY-LOK TB SYRINGE 27GX.5" 27G X 1/2" 1 ML MISC  08/18/19   [provider]    Family History Family History  Problem Relation Age of Onset  . Cancer Mother   . Diabetes Mother   .  Hypertension Mother   . Hyperlipidemia Mother   . Cancer Father   . Hyperlipidemia Father   . Mental illness Sister   . Diabetes Sister   . Diabetes Maternal Grandmother   . Diabetes Maternal Grandfather   . Colon cancer Neg Hx   . Esophageal cancer Neg Hx   . Stomach cancer Neg Hx   . Rectal cancer Neg Hx     Social History Social History   Tobacco Use  . Smoking status: Former Smoker    Packs/day: 0.25    Years: 44.00    Pack years: 11.00    Types: Cigarettes    Quit date: 02/17/2015    Years since quitting: 5.1  . Smokeless tobacco: Never Used  Vaping Use  . Vaping Use: Never used  Substance Use Topics  . Alcohol use: Yes    Alcohol/week: 0.0 standard drinks    Comment: 07/03/2017 "might have a few drinks/year"  . Drug use: No     Allergies   Penicillins, Farxiga [dapagliflozin], and Metformin and related   Review of Systems Review of Systems  Reason unable to perform ROS: See HPI as above.     Physical Exam Triage Vital Signs ED Triage Vitals  Enc Vitals Group     BP 04/08/20 1519 120/74     Pulse Rate 04/08/20 1519 74     Resp 04/08/20 1519 16     Temp 04/08/20 1519 97.9 F (36.6 C)     Temp Source 04/08/20 1519 Oral     SpO2 04/08/20 1519 95 %     Weight --      Height --      Head Circumference --       Peak Flow --      Pain Score 04/08/20 1520 0     Pain Loc --      Pain Edu? --      Excl. in [place]? --    No data found.  Updated Vital Signs BP 120/74 (BP Location: Right Arm)   Pulse 74   Temp 97.9 F (36.6 C) (Oral)   Resp 16   LMP  (LMP Unknown)   SpO2 95%   Visual Acuity Right Eye Distance: With glasses 20/40  without 20/100 Left Eye Distance: With glasses 20/50 - 1    without 20/200 Bilateral Distance: With glasses 20/40   without 20/70  Right Eye Near:   Left Eye Near:    Bilateral Near:     Physical Exam Constitutional:      General: She is not in acute distress.    Appearance: She is well-developed. She is not ill-appearing, toxic-appearing or diaphoretic.  HENT:     Head: Normocephalic and atraumatic.  Eyes:     General: Lids are normal. Lids are everted, no foreign bodies appreciated.     Extraocular Movements: Extraocular movements intact.     Pupils: Pupils are equal, round, and reactive to light.     Comments: Subconjunctival hemorrhage to the left eye temporally. Does not extend to the iris/pupils. No photophobia on exam.  Neurological:     Mental Status: She is alert and oriented to person, place, and time.      UC Treatments / Results  Labs (all labs ordered are listed, but only abnormal results are displayed) Labs Reviewed - No data to display  EKG   Radiology No results found.  Procedures Procedures (including critical care time)  Medications Ordered in UC Medications - No data to display  Initial Impression / Assessment and Plan / UC Course  I have reviewed the triage vital signs and the nursing notes.  Pertinent labs & imaging results that were available during my care of the patient were reviewed by me and considered in my medical decision making (see chart for details).    Subconjunctival hemorrhage, possibly due to sneezing. Does not extend to iris/pupils. No pain, photophobia, vision changes. Will have patient monitor and follow  up with ophthalmology. Return precautions given.  Final Clinical Impressions(s) / UC Diagnoses   Final diagnoses:  Subconjunctival hemorrhage of left eye   ED Prescriptions    None     PDMP not reviewed this encounter.   Ok Edwards, PA-C 04/08/20 1552

## 2020-04-08 NOTE — ED Triage Notes (Signed)
Patient states spouse noticed redness in her left eye this morning. Patient states she not scratched her left eye, no itching, no pain, no injury and no rubbing.

## 2020-04-09 ENCOUNTER — Encounter: Payer: Self-pay | Admitting: Family Medicine

## 2020-05-04 DIAGNOSIS — M0579 Rheumatoid arthritis with rheumatoid factor of multiple sites without organ or systems involvement: Secondary | ICD-10-CM | POA: Diagnosis not present

## 2020-05-04 DIAGNOSIS — M2550 Pain in unspecified joint: Secondary | ICD-10-CM | POA: Diagnosis not present

## 2020-05-04 DIAGNOSIS — R5382 Chronic fatigue, unspecified: Secondary | ICD-10-CM | POA: Diagnosis not present

## 2020-05-04 LAB — HEPATIC FUNCTION PANEL
ALT: 23 (ref 7–35)
AST: 15 (ref 13–35)
Alkaline Phosphatase: 109 (ref 25–125)
Bilirubin, Total: 0.2

## 2020-05-04 LAB — CBC AND DIFFERENTIAL
HCT: 30 — AB (ref 36–46)
Hemoglobin: 9.1 — AB (ref 12.0–16.0)
Platelets: 18 — AB (ref 150–399)

## 2020-05-04 LAB — BASIC METABOLIC PANEL
BUN: 12 (ref 4–21)
Chloride: 100 (ref 99–108)
Creatinine: 1.2 — AB (ref 0.5–1.1)
Glucose: 155
Potassium: 4.5 (ref 3.4–5.3)
Sodium: 140 (ref 137–147)

## 2020-05-04 LAB — COMPREHENSIVE METABOLIC PANEL: GFR calc Af Amer: 48

## 2020-05-10 ENCOUNTER — Encounter: Payer: Self-pay | Admitting: Family Medicine

## 2020-05-31 ENCOUNTER — Other Ambulatory Visit: Payer: Self-pay | Admitting: Family Medicine

## 2020-05-31 DIAGNOSIS — Z1231 Encounter for screening mammogram for malignant neoplasm of breast: Secondary | ICD-10-CM

## 2020-06-02 DIAGNOSIS — R5382 Chronic fatigue, unspecified: Secondary | ICD-10-CM | POA: Diagnosis not present

## 2020-06-02 DIAGNOSIS — M2550 Pain in unspecified joint: Secondary | ICD-10-CM | POA: Diagnosis not present

## 2020-06-02 DIAGNOSIS — M0579 Rheumatoid arthritis with rheumatoid factor of multiple sites without organ or systems involvement: Secondary | ICD-10-CM | POA: Diagnosis not present

## 2020-06-02 LAB — CBC AND DIFFERENTIAL
HCT: 30 — AB (ref 36–46)
Hemoglobin: 8.7 — AB (ref 12.0–16.0)
Platelets: 334 (ref 150–399)
WBC: 9.9

## 2020-06-02 LAB — BASIC METABOLIC PANEL
BUN: 14 (ref 4–21)
CO2: 28 — AB (ref 13–22)
Chloride: 99 (ref 99–108)
Creatinine: 1.1 (ref 0.5–1.1)
Glucose: 198
Potassium: 4.1 (ref 3.4–5.3)
Sodium: 139 (ref 137–147)

## 2020-06-02 LAB — COMPREHENSIVE METABOLIC PANEL
Calcium: 9.3 (ref 8.7–10.7)
GFR calc Af Amer: 62
GFR calc non Af Amer: 54

## 2020-06-02 LAB — HEPATIC FUNCTION PANEL
ALT: 26 (ref 7–35)
AST: 24 (ref 13–35)
Alkaline Phosphatase: 104 (ref 25–125)
Bilirubin, Total: 0.2

## 2020-06-07 ENCOUNTER — Telehealth: Payer: Self-pay | Admitting: *Deleted

## 2020-06-07 NOTE — Telephone Encounter (Signed)
A message was left, re: her follow up visit. 

## 2020-06-10 ENCOUNTER — Other Ambulatory Visit: Payer: Self-pay

## 2020-06-10 ENCOUNTER — Ambulatory Visit
Admission: RE | Admit: 2020-06-10 | Discharge: 2020-06-10 | Disposition: A | Discharge status: Home / Self Care | Payer: Medicare Other | Source: Ambulatory Visit | Attending: Family Medicine | Admitting: Family Medicine

## 2020-06-10 DIAGNOSIS — Z1231 Encounter for screening mammogram for malignant neoplasm of breast: Secondary | ICD-10-CM

## 2020-06-17 ENCOUNTER — Other Ambulatory Visit: Payer: Self-pay

## 2020-06-17 ENCOUNTER — Encounter: Payer: Self-pay | Admitting: Internal Medicine

## 2020-06-17 ENCOUNTER — Ambulatory Visit (INDEPENDENT_AMBULATORY_CARE_PROVIDER_SITE_OTHER): Payer: Medicare Other | Admitting: Internal Medicine

## 2020-06-17 VITALS — BP 150/70 | HR 76 | Ht 63.0 in | Wt 241.0 lb

## 2020-06-17 DIAGNOSIS — Z794 Long term (current) use of insulin: Secondary | ICD-10-CM | POA: Diagnosis not present

## 2020-06-17 DIAGNOSIS — E1165 Type 2 diabetes mellitus with hyperglycemia: Secondary | ICD-10-CM

## 2020-06-17 DIAGNOSIS — E1159 Type 2 diabetes mellitus with other circulatory complications: Secondary | ICD-10-CM | POA: Diagnosis not present

## 2020-06-17 DIAGNOSIS — E1139 Type 2 diabetes mellitus with other diabetic ophthalmic complication: Secondary | ICD-10-CM | POA: Diagnosis not present

## 2020-06-17 LAB — POCT GLYCOSYLATED HEMOGLOBIN (HGB A1C): Hemoglobin A1C: 8.6 % — AB (ref 4.0–5.6)

## 2020-06-17 MED ORDER — DEXCOM G6 SENSOR MISC: 1.0000 | 6 refills | Status: DC

## 2020-06-17 MED ORDER — DEXCOM G6 TRANSMITTER MISC: 1.0000 | 3 refills | Status: DC

## 2020-06-17 NOTE — Patient Instructions (Signed)
-   Decrease Lantus to 54 units daily  - Restart Humalog 16 units with each meal  - Continue Victoza 1.8 mg daily       HOW TO TREAT LOW BLOOD SUGARS (Blood sugar LESS THAN 70 MG/DL)  Please follow the RULE OF 15 for the treatment of hypoglycemia treatment (when your (blood sugars are less than 70 mg/dL)    STEP 1: Take 15 grams of carbohydrates when your blood sugar is low, which includes:   3-4 GLUCOSE TABS  OR  3-4 OZ OF JUICE OR REGULAR SODA OR  ONE TUBE OF GLUCOSE GEL     STEP 2: RECHECK blood sugar in 15 MINUTES STEP 3: If your blood sugar is still low at the 15 minute recheck --> then, go back to STEP 1 and treat AGAIN with another 15 grams of carbohydrates.

## 2020-06-17 NOTE — Progress Notes (Signed)
Name: Lynn [name]  Age/ Sex: 64 y.o., female   MRN/ DOB: [id number], Jul 29, 1956     PCP: [name], MD   Reason for Endocrinology Evaluation: Type 2 Diabetes Mellitus  Initial Endocrine Consultative Visit: 03/31/2019    PATIENT IDENTIFIER: Lynn [name] is a 64 y.o. female with a past medical history of T2DM, HTN, dyslipidemia,thalassemia, OSA on CPAP . The patient has followed with Endocrinology clinic since 03/31/2019 for consultative assistance with management of her diabetes.  DIABETIC HISTORY:  Lynn [name] was diagnosed with T2DM in 2010, She has been on metformin in the past with reported prior intolerance due to diarrhea. Was on Glipizide at some point with no side effects.Has been on insulin for years. Her hemoglobin A1c has ranged from  7.7% in 2018, peaking at 10.4% in 2020.  On her initial presentation to our clinic, her A1c was 10.4% , she was on Lantus and Victoza    [name] was tried in 03/2019 but developed severe dizziness .     Injectable methotrexate was started November 2020 due to rheumatoid arthritis.  Patient is intolerant to oral methotrexate   SUBJECTIVE:   During the last visit (12/01/2019): A1c 8.8 % we continued  Lantus and continued Victoza.   Today (06/17/2020): Lynn [name] is here for a month follow up on diabetes management. She missed a 3 month follow up back in 02/2020.     She checks her blood sugars multiple times daily through CGM. The patient has not had hypoglycemic episodes since the last clinic visit.     HOME DIABETES REGIMEN:  Lantus 54 units daily - self increased to 60 units  Victoza 1.8 mg daily       CONTINUOUS GLUCOSE MONITORING RECORD INTERPRETATION    Dates of Recording: 8/11-8/24/2021  Sensor description:Freestyle libre  Results statistics:   CGM use % of time 56  Average and SD 165/31.9  Time in range   62  %  % Time Above 180 31  % Time above 250 7  % Time Below target 0     Glycemic patterns summary: Glucose within goal in the morning, hyperglycemia is noted after meals Hyperglycemic episodes  Post prandial  Hypoglycemic episodes occurred N/A  Overnight periods: stable   METER DOWNLOAD SUMMARY: 8/4-06/17/2020 Fingerstick Blood Glucose Tests = 14 Average Number Tests/Day = 0.5 Overall Mean FS Glucose = 160 Standard Deviation = 43  BG Ranges: Low = 111 High = 233  Hypoglycemic Events/30 Days: BG < 50 = 0 Episodes of symptomatic severe hypoglycemia = 0    HISTORY:  Past Medical History:  Past Medical History:  Diagnosis Date  . Anginal pain ([place])   . Anxiety   . Arthritis    "knees, wrists, back, elbows" (07/03/2017)  . Clotting disorder ([place])    blood clot in eye 2016  . Coronary artery disease, non-occlusive    a. 11/2016 NSTEMI/Cath: LM nl, LAD 40p, 83md, D1/2 small, LCX 40ost, OM2/3 nl/small, RCA 35 ost/mid, RPDA/RPL small/nl.  . Depression   . Diastolic dysfunction    a. 11/2016 Echo: EF 55-60%, gr1 DD, sev calcified MV annulus, mildly dil LA.  Marland Kitchen Eye hemorrhage 01/2015   "right; resolved" (07/03/2017)  . Headache    "monthly" (07/03/2017)  . Heart murmur   . History of blood transfusion    "when I had an ectopic pregnancy"  . History of stomach ulcers   . Hyperlipidemia   . Hypertension   . Microcytic anemia   .  OSA on CPAP    setting is unknown  . Pneumonia    "several times" (07/03/2017)  . Syncope 07/03/2017 X 2   called seizures but no medications  . Thalassemia    "my cells are sickle cell shaped but I don't have sickle cell anemia" (07/03/2017)  . Type II diabetes mellitus ([place])    Past Surgical History:  Past Surgical History:  Procedure Laterality Date  . 48-Hour Monitor  03/18/2017   Sinus rhythm with sinus tachycardia (rate 58-134 BPM)multiple PVCs noted with couplets and bigeminy. One triplet. 7 runs of PAT ranging from 100-130 bpm. Longest was 33 beats.  Marland Kitchen BREAST BIOPSY Left   . CARDIAC CATHETERIZATION N/A  11/19/2016   Procedure: Left Heart Cath and Coronary Angiography;  Surgeon: Belva Crome, MD;  Location: [facility];  Service: Cardiovascular.    LM nl, LAD 40p, 64md, D1/2 small, LCX 40ost, OM2/3 nl/small, RCA 35 ost/mid, RPDA/RPL small/nl.  . CARPAL TUNNEL RELEASE Right 11/01/2014  . COLONOSCOPY    . DILATION AND CURETTAGE OF UTERUS    . ECTOPIC PREGNANCY SURGERY  X 2  . JOINT REPLACEMENT    . KNEE ARTHROSCOPY Left 11/2014  . TONSILLECTOMY    . TOTAL KNEE ARTHROPLASTY Right 02/18/2015   Procedure: RIGHT TOTAL KNEE ARTHROPLASTY;  Surgeon: Netta Cedars, MD;  Location: [facility];  Service: Orthopedics;  Laterality: Right;  . TOTAL KNEE ARTHROPLASTY Left 08/19/2015   Procedure: LEFT TOTAL KNEE ARTHROPLASTY;  Surgeon: Netta Cedars, MD;  Location: [facility];  Service: Orthopedics;  Laterality: Left;  . TRANSTHORACIC ECHOCARDIOGRAM  11/2016; 07/2017:   a) normal LV size, thickness and function. EF 55-60%. GR 1 DD.No RWMA severe mitral annular calcification, but no mitral stenosis. Mild LA dilation;; b) normal LV size and function.  EF 65-75%.  Unable to assess diastolic function.  Aortic sclerosis with no stenosis.  Moderate mitral stenosis? (Gradient 9 mmHg) -?  Mild-mod left atrial enlargement   . VAGINAL HYSTERECTOMY      Social History:  reports that she quit smoking about 5 years ago. Her smoking use included cigarettes. She has a 11.00 pack-year smoking history. She has never used smokeless tobacco. She reports current alcohol use. She reports that she does not use drugs. Family History:  Family History  Problem Relation Age of Onset  . Cancer Mother   . Diabetes Mother   . Hypertension Mother   . Hyperlipidemia Mother   . Cancer Father   . Hyperlipidemia Father   . Mental illness Sister   . Diabetes Sister   . Diabetes Maternal Grandmother   . Diabetes Maternal Grandfather   . Colon cancer Neg Hx   . Esophageal cancer Neg Hx   . Stomach cancer Neg Hx   . Rectal cancer Neg Hx       HOME MEDICATIONS: Allergies as of 06/17/2020      Reactions   Penicillins Hives   Childhood allergy Has patient had a PCN reaction causing immediate rash, facial/tongue/throat swelling, SOB or lightheadedness with hypotension: Yes Has patient had a PCN reaction causing severe rash involving mucus membranes or skin necrosis: No Has patient had a PCN reaction that required hospitalization No Has patient had a PCN reaction occurring within the last 10 years: No If all of the above answers are "NO", then may proceed with Cephalosporin use.   Farxiga [dapagliflozin] Other (See Comments)   Dizzy and lethargic   Metformin And Related    Diarrhea, bleeding  Medication List       Accurate as of June 17, 2020  3:04 PM. If you have any questions, ask your nurse or doctor.        atorvastatin 80 MG tablet Commonly known as: LIPITOR TAKE 1 TABLET(80 MG) BY MOUTH DAILY AT 6 PM   B-D UF III MINI PEN NEEDLES 31G X 5 MM Misc Generic drug: Insulin Pen Needle USE DAILY WITH VICTOZA AND LANTUS.   Insulin Pen Needle 31G X 5 MM Misc 1 Device by Does not apply route 3 (three) times daily.   cetirizine 10 MG tablet Commonly known as: ZYRTEC Take 10 mg by mouth daily as needed for allergies.   Contour Next Test test strip Generic drug: glucose blood 3x daily   diltiazem 180 MG 24 hr capsule Commonly known as: CARDIZEM CD Take 1 capsule (180 mg total) by mouth at bedtime.   folic acid 1 MG tablet Commonly known as: FOLVITE Take 2 tablets (2 mg total) by mouth daily.   hydrochlorothiazide 12.5 MG tablet Commonly known as: HYDRODIURIL Take one by mouth daily   Lancets 30G Misc 1 Device by Does not apply route 3 (three) times daily.   Lantus SoloStar 100 UNIT/ML Solostar Pen Generic drug: insulin glargine Inject 60 Units into the skin daily. What changed: Another medication with the same name was removed. Continue taking this medication, and follow the directions you see  here. Changed by: Dorita Sciara, MD   losartan 50 MG tablet Commonly known as: COZAAR Take one by mouth daily   methotrexate 50 MG/2ML injection   multivitamin with minerals Tabs tablet Take 1 tablet by mouth daily. Reported on 01/03/2016   Olopatadine HCl 0.2 % Soln Apply 1 drop to eye daily. Apply to both eyes as needed for allergies   PRESCRIPTION MEDICATION Inhale into the lungs at bedtime. CPAP   SAFETY-LOK TB SYRINGE 27GX.5" 27G X 1/2" 1 ML Misc Generic drug: TUBERCULIN SYR 1CC/27GX1/2"   Victoza 18 MG/3ML Sopn Generic drug: liraglutide Inject 0.3 mLs (1.8 mg total) into the skin daily.        OBJECTIVE:   Vital Signs: BP (!) 150/70 (BP Location: Left Arm, Patient Position: Sitting, Cuff Size: Normal)   Pulse 76   Ht 5\' 3"  (1.6 m)   Wt 241 lb (109.3 kg)   LMP  (LMP Unknown)   SpO2 97%   BMI 42.69 kg/m   Wt Readings from Last 3 Encounters:  06/17/20 241 lb (109.3 kg)  03/21/20 242 lb (109.8 kg)  01/25/20 241 lb (109.3 kg)     Exam: General: Pt appears well and is in NAD  Neck: General: Supple without adenopathy. Thyroid: Thyroid size normal.  No goiter or nodules appreciated. No thyroid bruit.  Lungs: Clear with good BS bilat with no rales, rhonchi, or wheezes  Heart: RRR with normal S1 and S2 and no gallops; no murmurs; no rub  Extremities: No pretibial edema  Skin: Normal texture and temperature to palpation.  Neuro: MS is good with appropriate affect, pt is alert and Ox3       DM foot exam: 12/02/2019 The skin of the feet is intact without sores or ulcerations. The pedal pulses are 2+ on right and 2+ on left. The sensation is intact to a screening 5.07, 10 gram monofilament bilaterally    DATA REVIEWED:  Lab Results  Component Value Date   HGBA1C 8.6 (A) 06/17/2020   HGBA1C 9.8 (H) 03/21/2020   HGBA1C 8.8 (A) 12/01/2019  Lab Results  Component Value Date   MICROALBUR <0.7 03/23/2019   LDLCALC 44 03/23/2019   CREATININE 1.2  (A) 05/04/2020   Lab Results  Component Value Date   MICRALBCREAT 1.4 03/23/2019     Lab Results  Component Value Date   CHOL 103 03/23/2019   HDL 45.80 03/23/2019   LDLCALC 44 03/23/2019   LDLDIRECT 131 (H) 09/02/2013   TRIG 66.0 03/23/2019   CHOLHDL 2 03/23/2019         ASSESSMENT / PLAN / RECOMMENDATIONS:   1) Type 2 Diabetes Mellitus, Poorly controlled, With retinopathy and macrovascular complications - Most recent A1c of 8.6 %. Goal A1c <7.0 %.     - A1c down from 9.8%  - On her last visit she states the co-pay for hUmalog was cost prohibitive, so we opted to see how she does without it, but today she tells me she was able to get it but has not used it.  - She is intolerant to Metformin and SGLT_2 inhibitors - We briefly discussed different types of insulin pumps , she is not interested in carb counting and we have opted to try her on the V-GO  - A referral has been initiated to our CDE , in the meantime she will restart Humalog at 16 units with each meal  - I also have advised her against self increasing basal insulin ( she increased it from 54 units to 6 units) as this will increase her risk of nighttime hypoglycemia.   MEDICATIONS: -Continue Lantus 54  units daily  - Restart HUmalog 16 units TIDQAC -Continue Victoza 1.8 mg daily    PUMP Settings: V-Go 30   I spent 30 minutes preparing to see the patient by review of recent labs, imaging and procedures, obtaining and reviewing separately obtained history, communicating with the patient, ordering medications, tests or procedures, and documenting clinical information in the EHR including the differential Dx, treatment, and any further evaluation and other management    EDUCATION / INSTRUCTIONS:  BG monitoring instructions: Patient is instructed to check her blood sugars 3 times a day, fasting , supper and bedtime.  Call [place] [facility] if: BG persistently < 70  . I reviewed the Rule of 15 for the  treatment of hypoglycemia in detail with the patient. Literature supplied.   F/U in 3 months    Signed electronically by: Mack Guise, MD  [facility]  [address] [place], [place] [address] Phone: [phone] FAX: [phone]   CC: [name], [address] [place] [address] Phone: [phone]  Fax: [phone]  Return to Endocrinology clinic as below: Future Appointments  Date Time Provider [place]  08/09/2020  2:40 PM Leonie Man, MD [facility]

## 2020-06-21 ENCOUNTER — Telehealth: Payer: Self-pay | Admitting: Internal Medicine

## 2020-06-21 NOTE — Telephone Encounter (Signed)
Patient called stating her Dexcom unit, transmitters, and sensors all need to be refilled and sent to [facility] not wanting to use [facility]. Patient does not have a fax# but she has a ph# - [phone], and patient ph# [phone] if we need any additional information from patient.

## 2020-06-21 NOTE — Telephone Encounter (Signed)
Contacted pt and informed her that if she would like her supplies sent to [facility], she must first contact them and they will then fax the proper documentation to this office.

## 2020-07-04 ENCOUNTER — Telehealth: Payer: Self-pay

## 2020-07-04 NOTE — Telephone Encounter (Signed)
New message    The patient is asking the nurse to call her back - reference to last office visit .    Fax # given [phone]

## 2020-07-04 NOTE — Telephone Encounter (Signed)
Called pt back and she stated that [facility] and [facility] would be faxing some paperwork to this office in order to get her insulin pump and CGM.

## 2020-07-05 NOTE — Telephone Encounter (Addendum)
[facility] [phone] called checking status of medical record request for last office visit notte. Fax [phone].

## 2020-07-14 ENCOUNTER — Encounter: Payer: Self-pay | Admitting: Internal Medicine

## 2020-07-14 DIAGNOSIS — E1165 Type 2 diabetes mellitus with hyperglycemia: Secondary | ICD-10-CM | POA: Diagnosis not present

## 2020-07-14 DIAGNOSIS — Z794 Long term (current) use of insulin: Secondary | ICD-10-CM | POA: Diagnosis not present

## 2020-07-19 ENCOUNTER — Other Ambulatory Visit: Payer: Self-pay | Admitting: Family Medicine

## 2020-07-19 ENCOUNTER — Encounter: Payer: Self-pay | Admitting: Internal Medicine

## 2020-07-19 DIAGNOSIS — H1013 Acute atopic conjunctivitis, bilateral: Secondary | ICD-10-CM

## 2020-07-25 ENCOUNTER — Telehealth: Payer: Self-pay

## 2020-07-25 NOTE — Telephone Encounter (Signed)
New message   The patient is calling to see has the [facility] - insulin pump trying to get quantify for medicare.   Requesting glucose records / get a script the unit the patient requesting pharmacy strip  Fax # [phone]

## 2020-07-25 NOTE — Telephone Encounter (Signed)
Per provider patient is not going to do T-slim because she doesn't want to keep a count of the calories. Patient wants to come in and discuss with Dr.[name]. Patient has been scheduled for 08/11/2020

## 2020-07-26 ENCOUNTER — Encounter: Payer: Self-pay | Admitting: Internal Medicine

## 2020-07-26 MED ORDER — T: SLIM X2 INS PMP/CONTROL 7.4 DEVI: 1 refills | Status: DC

## 2020-08-09 ENCOUNTER — Other Ambulatory Visit: Payer: Self-pay

## 2020-08-09 ENCOUNTER — Ambulatory Visit: Payer: Medicare Other | Admitting: Cardiology

## 2020-08-09 ENCOUNTER — Encounter: Payer: Self-pay | Admitting: Cardiology

## 2020-08-09 VITALS — BP 150/78 | HR 68 | Ht 64.0 in | Wt 245.0 lb

## 2020-08-09 DIAGNOSIS — I10 Essential (primary) hypertension: Secondary | ICD-10-CM

## 2020-08-09 DIAGNOSIS — I050 Rheumatic mitral stenosis: Secondary | ICD-10-CM

## 2020-08-09 DIAGNOSIS — R0609 Other forms of dyspnea: Secondary | ICD-10-CM

## 2020-08-09 DIAGNOSIS — E785 Hyperlipidemia, unspecified: Secondary | ICD-10-CM | POA: Diagnosis not present

## 2020-08-09 DIAGNOSIS — I201 Angina pectoris with documented spasm: Secondary | ICD-10-CM | POA: Diagnosis not present

## 2020-08-09 DIAGNOSIS — I471 Supraventricular tachycardia: Secondary | ICD-10-CM | POA: Diagnosis not present

## 2020-08-09 DIAGNOSIS — I2510 Atherosclerotic heart disease of native coronary artery without angina pectoris: Secondary | ICD-10-CM

## 2020-08-09 DIAGNOSIS — R0600 Dyspnea, unspecified: Secondary | ICD-10-CM

## 2020-08-09 DIAGNOSIS — I5032 Chronic diastolic (congestive) heart failure: Secondary | ICD-10-CM

## 2020-08-09 MED ORDER — ISOSORBIDE MONONITRATE ER 30 MG PO TB24: ORAL_TABLET | ORAL | 7 refills | Status: DC

## 2020-08-09 MED ORDER — DILTIAZEM HCL ER COATED BEADS 240 MG PO CP24: 240.0000 mg | ORAL_CAPSULE | Freq: Every day | ORAL | 3 refills | Status: DC

## 2020-08-09 MED ORDER — LOSARTAN POTASSIUM 100 MG PO TABS: 100.0000 mg | ORAL_TABLET | Freq: Every day | ORAL | 3 refills | Status: DC

## 2020-08-09 NOTE — Patient Instructions (Addendum)
Medication Instructions:  Increase - losartan to 100 mg daily    Increase diltiazem  240 mg one capsule daily   Imdur 30 mg--- Take as needed for chest spasm episodes take  for 3 days and then stop .  Stop Losartan 50 mg  Stop Diltiazem 180 mg   *If you need a refill on your cardiac medications before your next appointment, please call your pharmacy*   Lab Work: Not needed   Testing/Procedures: Not needed   Follow-Up: At [facility], you and your health needs are our priority.  As part of our continuing mission to provide you with exceptional heart care, we have created designated Provider Care Teams.  These Care Teams include your primary Cardiologist (physician) and Advanced Practice Providers (APPs -  Physician Assistants and Nurse Practitioners) who all work together to provide you with the care you need, when you need it.    Your next appointment:   3 to 4 month(s)  The format for your next appointment:   In Person  Provider:   Glenetta Hew, MD
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
## 2020-08-11 ENCOUNTER — Ambulatory Visit: Payer: Medicare Other | Admitting: Internal Medicine

## 2020-08-12 ENCOUNTER — Encounter: Payer: Self-pay | Admitting: Internal Medicine

## 2020-08-12 ENCOUNTER — Telehealth: Payer: Self-pay | Admitting: Internal Medicine

## 2020-08-12 NOTE — Telephone Encounter (Signed)
Patient is having concerns with getting her insulin pump. She said that she researched what kind of pump she wanted  and asked for a certain pump. She stated that she is concerned that it has not gone through. Does she need another appointment with Dr. Kelton Pillar to discuss? She really wants a pump and feels like she is getting the run around about it. Please let patient know what next steps are and what she needs. She said that she was told that her pump was cancelled because of cancelling an appointment. She needs the [facility] and labs sent in to [email or web address] [phone] ext [phone] Fax [phone]

## 2020-08-12 NOTE — Telephone Encounter (Signed)
Lynn [name] orders pump. But we have ordered her CGM. It seems ive been refilling the same form over and over

## 2020-08-12 NOTE — Telephone Encounter (Signed)
Helene Kelp called back stating that order was cancel per patient's request. Inform her that according to patient, she did not request to cancel.  Helene Kelp re-fax the order and require informations for patient to received the insulin pump.   Place form in Dr [facility] inbox for review.  Patient stated she wanted Dr Kelton Pillar to know that she did not want it to be cancel, she only told someone in our office to reschedule appointment.

## 2020-08-12 NOTE — Telephone Encounter (Signed)
I have called [place] to get more information. I was given the number for patient's coordinator Helene Kelp) [phone]. Left message for Helene Kelp to call me back regarding patient's insulin pump.

## 2020-08-16 ENCOUNTER — Encounter: Payer: Medicare Other | Attending: Internal Medicine | Admitting: Nutrition

## 2020-08-16 ENCOUNTER — Other Ambulatory Visit: Payer: Self-pay

## 2020-08-16 DIAGNOSIS — E1165 Type 2 diabetes mellitus with hyperglycemia: Secondary | ICD-10-CM | POA: Insufficient documentation

## 2020-08-16 DIAGNOSIS — Z794 Long term (current) use of insulin: Secondary | ICD-10-CM | POA: Diagnosis not present

## 2020-08-16 NOTE — Progress Notes (Signed)
Patient was shown both the V-Go and Tandem pump.  She is currently wearing a Dexcom, and feels like she would like the Tandem pump, because it will connect with her Dexcom and help to control her blood sugars better.   She reports having done "a lot of research on this pump", and is willing to do "whatever it takes to get this".  We discussed what is needed when wearing a pump, and she has agreed to all of the requirements.  She does not know how to count carbs, but has downloaded an app for this. We discussed what carbs are, and the the portion sizes for 15 grams Handout was given to her for this.  She promised to review this and call if more questions.   Note to Dr. Kelton Pillar to see if we can do C-peptide labs and FBS for this paperwork for tomorrow.  Patient was told that FBS needs to be below 225, and she reported that that would be no problem.

## 2020-08-16 NOTE — Patient Instructions (Addendum)
Review information given on carb counting, and call if questions.   Call when insulin pump comes in for training.

## 2020-08-17 ENCOUNTER — Other Ambulatory Visit: Payer: Self-pay

## 2020-08-17 ENCOUNTER — Other Ambulatory Visit: Payer: Medicare Other

## 2020-08-17 ENCOUNTER — Telehealth: Payer: Self-pay

## 2020-08-17 DIAGNOSIS — Z794 Long term (current) use of insulin: Secondary | ICD-10-CM | POA: Diagnosis not present

## 2020-08-17 DIAGNOSIS — E1165 Type 2 diabetes mellitus with hyperglycemia: Secondary | ICD-10-CM | POA: Diagnosis not present

## 2020-08-17 NOTE — Telephone Encounter (Signed)
I was told by Del that we need to complete this form again because the order was cancelled. Please advise.

## 2020-08-17 NOTE — Telephone Encounter (Signed)
Del is calling in from [facility] asking for an update on the patient's insulin pump, the certificate of medical necessity form and the patients most recent lab work.

## 2020-08-18 ENCOUNTER — Encounter: Payer: Self-pay | Admitting: Cardiology

## 2020-08-18 LAB — C-PEPTIDE: C-Peptide: 2.9 ng/mL (ref 1.1–4.4)

## 2020-08-18 LAB — GLUCOSE, RANDOM: Glucose: 161 mg/dL — ABNORMAL HIGH (ref 65–99)

## 2020-08-18 NOTE — Assessment & Plan Note (Signed)
Overall improved with increased lumbar activity.

## 2020-08-18 NOTE — Assessment & Plan Note (Signed)
Mild to moderate stenosis noted.  No significant change in symptoms.  Follow-up echo after 2-3 years.

## 2020-08-18 NOTE — Telephone Encounter (Signed)
Faxed as instructed. Copy to scan

## 2020-08-18 NOTE — Assessment & Plan Note (Signed)
She is now on atorvastatin stable dose.  Most recent labs were from June well-controlled.  Due to have labs checked soon.

## 2020-08-18 NOTE — Assessment & Plan Note (Signed)
Tachycardia spells are notably improved with diltiazem.  I am increasing dose for other effects, but this will also help palpitations.

## 2020-08-18 NOTE — Assessment & Plan Note (Signed)
Euvolemic on exam.  Is on HCTZ plus losartan which I will increase 200 mg daily for additional reduction. Class I-II symptoms.  More dyspnea related to obesity as opposed to

## 2020-08-18 NOTE — Assessment & Plan Note (Signed)
Discussed in detail options of diet modification with plans to try to lose some weight.

## 2020-08-18 NOTE — Assessment & Plan Note (Signed)
Intermittent chest comfort spells, may be related to spasm, not sure.    Will increase diltiazem to 240 mg, and add as needed Imdur 30 mg for 3 days.

## 2020-08-18 NOTE — Assessment & Plan Note (Signed)
High blood pressure today.  We have room to increase pulse losartan to 100 mg daily and diltiazem up to 240 mg daily.

## 2020-08-18 NOTE — Assessment & Plan Note (Signed)
Nonocclusive CAD by ischemic evaluation, however, exclude microvascular disease or spasm\.   To treat microvascular disease, will increase diltiazem dose to 240 mg daily which will also help rate control.    Also increasing afterload reduction by increasing losartan to 100 daily.  Continue atorvastatin.    Also potentially consider aspirin.

## 2020-08-18 NOTE — Telephone Encounter (Signed)
Please fax OV, c-peptide and glucose results with the form.    Thanks

## 2020-08-19 DIAGNOSIS — H2513 Age-related nuclear cataract, bilateral: Secondary | ICD-10-CM | POA: Diagnosis not present

## 2020-08-19 DIAGNOSIS — H5203 Hypermetropia, bilateral: Secondary | ICD-10-CM | POA: Diagnosis not present

## 2020-08-19 DIAGNOSIS — H52223 Regular astigmatism, bilateral: Secondary | ICD-10-CM | POA: Diagnosis not present

## 2020-08-19 DIAGNOSIS — E113291 Type 2 diabetes mellitus with mild nonproliferative diabetic retinopathy without macular edema, right eye: Secondary | ICD-10-CM | POA: Diagnosis not present

## 2020-08-19 DIAGNOSIS — H524 Presbyopia: Secondary | ICD-10-CM | POA: Diagnosis not present

## 2020-08-20 NOTE — Patient Instructions (Addendum)
Good to see you again today!   I will be in touch with your labs asap You got your pneumonia booster today  Assuming all is well we can plan to visit in about 6 months    Health Maintenance After Age 66 After age 26, you are at a higher risk for certain long-term diseases and infections as well as injuries from falls. Falls are a major cause of broken bones and head injuries in people who are older than age 66. Getting regular preventive care can help to keep you healthy and well. Preventive care includes getting regular testing and making lifestyle changes as recommended by your health care provider. Talk with your health care provider about:  Which screenings and tests you should have. A screening is a test that checks for a disease when you have no symptoms.  A diet and exercise plan that is right for you. What should I know about screenings and tests to prevent falls? Screening and testing are the best ways to find a health problem early. Early diagnosis and treatment give you the best chance of managing medical conditions that are common after age 45. Certain conditions and lifestyle choices may make you more likely to have a fall. Your health care provider may recommend:  Regular vision checks. Poor vision and conditions such as cataracts can make you more likely to have a fall. If you wear glasses, make sure to get your prescription updated if your vision changes.  Medicine review. Work with your health care provider to regularly review all of the medicines you are taking, including over-the-counter medicines. Ask your health care provider about any side effects that may make you more likely to have a fall. Tell your health care provider if any medicines that you take make you feel dizzy or sleepy.  Osteoporosis screening. Osteoporosis is a condition that causes the bones to get weaker. This can make the bones weak and cause them to break more easily.  Blood pressure screening. Blood  pressure changes and medicines to control blood pressure can make you feel dizzy.  Strength and balance checks. Your health care provider may recommend certain tests to check your strength and balance while standing, walking, or changing positions.  Foot health exam. Foot pain and numbness, as well as not wearing proper footwear, can make you more likely to have a fall.  Depression screening. You may be more likely to have a fall if you have a fear of falling, feel emotionally low, or feel unable to do activities that you used to do.  Alcohol use screening. Using too much alcohol can affect your balance and may make you more likely to have a fall. What actions can I take to lower my risk of falls? General instructions  Talk with your health care provider about your risks for falling. Tell your health care provider if: ? You fall. Be sure to tell your health care provider about all falls, even ones that seem minor. ? You feel dizzy, sleepy, or off-balance.  Take over-the-counter and prescription medicines only as told by your health care provider. These include any supplements.  Eat a healthy diet and maintain a healthy weight. A healthy diet includes low-fat dairy products, low-fat (lean) meats, and fiber from whole grains, beans, and lots of fruits and vegetables. Home safety  Remove any tripping hazards, such as rugs, cords, and clutter.  Install safety equipment such as grab bars in bathrooms and safety rails on stairs.  Keep rooms and  walkways well-lit. Activity   Follow a regular exercise program to stay fit. This will help you maintain your balance. Ask your health care provider what types of exercise are appropriate for you.  If you need a cane or walker, use it as recommended by your health care provider.  Wear supportive shoes that have nonskid soles. Lifestyle  Do not drink alcohol if your health care provider tells you not to drink.  If you drink alcohol, limit how  much you have: ? 0-1 drink a day for women. ? 0-2 drinks a day for men.  Be aware of how much alcohol is in your drink. In the [place] one drink equals one typical bottle of beer (12 oz), one-half glass of wine (5 oz), or one shot of hard liquor (1 oz).  Do not use any products that contain nicotine or tobacco, such as cigarettes and e-cigarettes. If you need help quitting, ask your health care provider. Summary  Having a healthy lifestyle and getting preventive care can help to protect your health and wellness after age 51.  Screening and testing are the best way to find a health problem early and help you avoid having a fall. Early diagnosis and treatment give you the best chance for managing medical conditions that are more common for people who are older than age 67.  Falls are a major cause of broken bones and head injuries in people who are older than age 89. Take precautions to prevent a fall at home.  Work with your health care provider to learn what changes you can make to improve your health and wellness and to prevent falls. This information is not intended to replace advice given to you by your health care provider. Make sure you discuss any questions you have with your health care provider. Document Revised: 01/22/2019 Document Reviewed: 08/14/2017 Elsevier Patient Education  2020 [employer].

## 2020-08-20 NOTE — Progress Notes (Addendum)
[facility] [address] [place], [place] [address] [phone]  Date:  08/22/2020   Name:  Lynn [name]   DOB:  1955/10/18   MRN:  [id number]  PCP:  [name], MD    Chief Complaint: Annual Exam   History of Present Illness:  Lynn [name] is a 64 y.o. very pleasant female patient who presents with the following:  Pt here today for a CPE Last seen by myself in June of this year   History of diabetes, hypertension, CAD/MI, COPD, sleep apnea, dyslipidemia, rheumatoid arthritis diagnosed last year, thalassemia  Her diabetes is managed by endocrinology, Dr. Kelton Pillar.  She has made good progress with her A1c Lab Results  Component Value Date   HGBA1C 8.6 (A) 06/17/2020   They are planning to get an insulin pump for her   Rheumatology-[facility]; she is using methotrexate injection once a week.  This does seem to be helping but did cause her to lose her hair; however she notes that the improvement in her anxiety symptoms is enough that she can deal with the hair loss  Visit with cardiology in June of this year- they increased her dose of dilt, increased losartan Her BP looks good today- she feels fine   Eye exam: pt went last week  Lipid panel Flu UTD covid done including booster Can do a pneumonia booster - will do today  Colon due next year per [facility]  Declines shingles vaccine today   She has a Dietitian starting college at [employer] next year-plans to study psychology.  Her other GD is a senior in [place] She has had that she has not seen her grandchildren much recently due to COVID-76.  They have been hesitant to get vaccinated.  She hopes they will be vaccinated soon as they can enjoy the holidays together She is working out at [employer]- she is enjoying this   No CP or SOB with exercise   Patient Active Problem List   Diagnosis Date Noted  . Coronary  artery spasm ([place]) 08/09/2020  . Rheumatoid arthritis ([place]) 11/09/2019  . Toe pain, bilateral 05/26/2019  . Type 2 diabetes mellitus with hyperglycemia, with long-term current use of insulin ([place]) 05/26/2019  . Mobility impaired 04/01/2019  . Morbid obesity due to excess calories ([place]) 12/03/2017  . Dyspnea on exertion 12/02/2017  . Mitral stenosis 08/23/2017  . Diastolic heart failure ([place]) 08/12/2017  . Acute respiratory failure with hypoxia ([place]) 08/12/2017  . OSA on CPAP 08/12/2017  . COPD with acute bronchitis ([place]) 08/12/2017  . Near syncope 07/03/2017  . Anxiety 07/03/2017  . Coronary artery disease, non-occlusive 12/08/2016  . PAT (paroxysmal atrial tachycardia) ([place])   . Demand myocardial infarction ([place]) 11/16/2016  . H/O total knee replacement 08/19/2015  . Vision, loss, sudden 03/07/2015  . S/P TKR (total knee replacement) using cement 02/18/2015  . Thalassemia 09/02/2013  . Hyperlipidemia with target low density lipoprotein (LDL) cholesterol less than 100 mg/dL 08/24/2012  . Essential hypertension 07/22/2012    Past Medical History:  Diagnosis Date  . Anginal pain ([place])   . Anxiety   . Arthritis    "knees, wrists, back, elbows" (07/03/2017)  . Clotting disorder ([place])    blood clot in eye 2016  . Coronary artery disease, non-occlusive    a. 11/2016 NSTEMI/Cath: LM nl, LAD 40p, 87md, D1/2 small, LCX 40ost, OM2/3 nl/small, RCA 35 ost/mid, RPDA/RPL  small/nl.  . Depression   . Diastolic dysfunction    a. 11/2016 Echo: EF 55-60%, gr1 DD, sev calcified MV annulus, mildly dil LA.  Marland Kitchen Eye hemorrhage 01/2015   "right; resolved" (07/03/2017)  . Headache    "monthly" (07/03/2017)  . Heart murmur   . History of blood transfusion    "when I had an ectopic pregnancy"  . History of stomach ulcers   . Hyperlipidemia   . Hypertension   . Microcytic anemia   . OSA on CPAP    setting is unknown  . Pneumonia    "several times" (07/03/2017)  . Syncope 07/03/2017 X 2   called  seizures but no medications  . Thalassemia    "my cells are sickle cell shaped but I don't have sickle cell anemia" (07/03/2017)  . Type II diabetes mellitus ([place])     Past Surgical History:  Procedure Laterality Date  . 48-Hour Monitor  03/18/2017   Sinus rhythm with sinus tachycardia (rate 58-134 BPM)multiple PVCs noted with couplets and bigeminy. One triplet. 7 runs of PAT ranging from 100-130 bpm. Longest was 33 beats.  Marland Kitchen BREAST BIOPSY Left   . CARDIAC CATHETERIZATION N/A 11/19/2016   Procedure: Left Heart Cath and Coronary Angiography;  Surgeon: Belva Crome, MD;  Location: [facility];  Service: Cardiovascular.    LM nl, LAD 40p, 66md, D1/2 small, LCX 40ost, OM2/3 nl/small, RCA 35 ost/mid, RPDA/RPL small/nl.  . CARPAL TUNNEL RELEASE Right 11/01/2014  . COLONOSCOPY    . DILATION AND CURETTAGE OF UTERUS    . ECTOPIC PREGNANCY SURGERY  X 2  . JOINT REPLACEMENT    . KNEE ARTHROSCOPY Left 11/2014  . TONSILLECTOMY    . TOTAL KNEE ARTHROPLASTY Right 02/18/2015   Procedure: RIGHT TOTAL KNEE ARTHROPLASTY;  Surgeon: Netta Cedars, MD;  Location: [facility];  Service: Orthopedics;  Laterality: Right;  . TOTAL KNEE ARTHROPLASTY Left 08/19/2015   Procedure: LEFT TOTAL KNEE ARTHROPLASTY;  Surgeon: Netta Cedars, MD;  Location: [facility];  Service: Orthopedics;  Laterality: Left;  . TRANSTHORACIC ECHOCARDIOGRAM  11/2016; 07/2017:   a) normal LV size, thickness and function. EF 55-60%. GR 1 DD.No RWMA severe mitral annular calcification, but no mitral stenosis. Mild LA dilation;; b) normal LV size and function.  EF 65-75%.  Unable to assess diastolic function.  Aortic sclerosis with no stenosis.  Moderate mitral stenosis? (Gradient 9 mmHg) -?  Mild-mod left atrial enlargement   . TRANSTHORACIC ECHOCARDIOGRAM  01/2019   EF 65 to 70%.  Normal function.  Elevated LVEDP-GR 1 DD.  Normal RV size and function.  Large calcific mass on posterior mitral leaflet mild to moderate mitral stenosis.  Marland Kitchen VAGINAL  HYSTERECTOMY      Social History   Tobacco Use  . Smoking status: Former Smoker    Packs/day: 0.25    Years: 44.00    Pack years: 11.00    Types: Cigarettes    Quit date: 02/17/2015    Years since quitting: 5.5  . Smokeless tobacco: Never Used  Vaping Use  . Vaping Use: Never used  Substance Use Topics  . Alcohol use: Yes    Alcohol/week: 0.0 standard drinks    Comment: 07/03/2017 "might have a few drinks/year"  . Drug use: No    Family History  Problem Relation Age of Onset  . Cancer Mother   . Diabetes Mother   . Hypertension Mother   . Hyperlipidemia Mother   . Cancer Father   . Hyperlipidemia Father   .  Mental illness Sister   . Diabetes Sister   . Diabetes Maternal Grandmother   . Diabetes Maternal Grandfather   . Colon cancer Neg Hx   . Esophageal cancer Neg Hx   . Stomach cancer Neg Hx   . Rectal cancer Neg Hx     Allergies  Allergen Reactions  . Penicillins Hives    Childhood allergy Has patient had a PCN reaction causing immediate rash, facial/tongue/throat swelling, SOB or lightheadedness with hypotension: Yes Has patient had a PCN reaction causing severe rash involving mucus membranes or skin necrosis: No Has patient had a PCN reaction that required hospitalization No Has patient had a PCN reaction occurring within the last 10 years: No If all of the above answers are "NO", then may proceed with Cephalosporin use.  [name] [Dapagliflozin] Other (See Comments)    Dizzy and lethargic   . Metformin And Related     Diarrhea, bleeding    Medication list has been reviewed and updated.  Current Outpatient Medications on File Prior to Visit  Medication Sig Dispense Refill  . atorvastatin (LIPITOR) 80 MG tablet TAKE 1 TABLET(80 MG) BY MOUTH DAILY AT 6 PM 90 tablet 3  . cetirizine (ZYRTEC) 10 MG tablet Take 10 mg by mouth daily as needed for allergies.    . Continuous Blood Gluc Sensor (DEXCOM G6 SENSOR) MISC 1 Device by Does not apply route as directed.  3 each 6  . Continuous Blood Gluc Transmit (DEXCOM G6 TRANSMITTER) MISC 1 Device by Does not apply route as directed. 1 each 3  . diltiazem (CARDIZEM CD) 240 MG 24 hr capsule Take 1 capsule (240 mg total) by mouth daily. 90 capsule 3  . folic acid (FOLVITE) 1 MG tablet Take 2 tablets (2 mg total) by mouth daily. 180 tablet 3  . glucose blood (CONTOUR NEXT TEST) test strip 3x daily 300 each 11  . HUMALOG KWIKPEN 100 UNIT/ML KwikPen Inject into the skin.    Marland Kitchen insulin glargine (LANTUS SOLOSTAR) 100 UNIT/ML Solostar Pen Inject 60 Units into the skin daily.    . Insulin Infusion Pump (T: SLIM X2 INS PMP/CONTROL 7.4) DEVI Use daily as needed directed 1 each 1  . Insulin Pen Needle (B-D UF III MINI PEN NEEDLES) 31G X 5 MM MISC USE DAILY WITH VICTOZA AND LANTUS. 400 each 1  . Insulin Pen Needle 31G X 5 MM MISC 1 Device by Does not apply route 3 (three) times daily. 150 each 11  . isosorbide mononitrate (IMDUR) 30 MG 24 hr tablet Take 30 mg  tablet as needed for Chest pain spasm episodes for 3 days and the stop 20 tablet 7  . Lancets 30G MISC 1 Device by Does not apply route 3 (three) times daily. 300 each 9  . liraglutide (VICTOZA) 18 MG/3ML SOPN Inject 0.3 mLs (1.8 mg total) into the skin daily. 4 mL 11  . losartan (COZAAR) 100 MG tablet Take 1 tablet (100 mg total) by mouth daily. 90 tablet 3  . methotrexate 50 MG/2ML injection     . Multiple Vitamin (MULTIVITAMIN WITH MINERALS) TABS tablet Take 1 tablet by mouth daily. Reported on 01/03/2016    . olopatadine (PATANOL) 0.1 % ophthalmic solution prn    . PRESCRIPTION MEDICATION Inhale into the lungs at bedtime. CPAP    . SAFETY-LOK TB SYRINGE 27GX.5" 27G X 1/2" 1 ML MISC     . hydrochlorothiazide (HYDRODIURIL) 12.5 MG tablet Take one by mouth daily (Patient not taking: Reported on 08/09/2020)  90 tablet 3   No current facility-administered medications on file prior to visit.    Review of Systems:  As per HPI- otherwise negative.   Physical  Examination: Vitals:   08/22/20 1352  BP: 126/72  Pulse: 75  Resp: 18  SpO2: 95%   Vitals:   08/22/20 1352  Weight: 242 lb (109.8 kg)  Height: 5\' 4"  (1.626 m)   Body mass index is 41.54 kg/m. Ideal Body Weight: Weight in (lb) to have BMI = 25: 145.3  GEN: no acute distress.  Obese, otherwise looks well HEENT: Atraumatic, Normocephalic.   PEERL, EOMI. TM wnl bilaterally  Ears and Nose: No external deformity. CV: RRR, No M/G/R. No JVD. No thrill. No extra heart sounds. PULM: CTA B, no wheezes, crackles, rhonchi. No retractions. No resp. distress. No accessory muscle use. ABD: S, NT, ND, +BS. No rebound. No HSM. EXTR: No c/c/e PSYCH: Normally interactive. Conversant.    Assessment and Plan: Physical exam  Essential hypertension  Type 2 diabetes mellitus with hyperglycemia, with long-term current use of insulin (H[facility])  Hyperlipidemia associated with type 2 diabetes mellitus ([place]) - Plan: Lipid panel  Immunization due - Plan: Pneumococcal polysaccharide vaccine 23-valent greater than or equal to 2yo subcutaneous/IM  Screening for thyroid disorder - Plan: TSH  Patient here today for a physical Blood pressure under good control Diabetes managed by endocrinology, plans to start insulin pump soon Updated pneumonia vaccine today Lipid panel and thyroid pending Encouraged healthy diet, continued exercise program This visit occurred during the SARS-CoV-2 public health emergency.  Safety protocols were in place, including screening questions prior to the visit, additional usage of staff PPE, and extensive cleaning of exam room while observing appropriate contact time as indicated for disinfecting solutions.    Signed Lamar Blinks, MD  Addendum 11/9, received her labs as below Message to patient  Results for orders placed or performed in visit on 08/22/20  Lipid panel  Result Value Ref Range   Cholesterol 97 0 - 200 mg/dL   Triglycerides 74.0 0 - 149 mg/dL   HDL 38.80  (L) >39.00 mg/dL   VLDL 14.8 0.0 - 40.0 mg/dL   LDL Cholesterol 44 0 - 99 mg/dL   Total CHOL/HDL Ratio 3    NonHDL 58.46   TSH  Result Value Ref Range   TSH 2.57 0.35 - 4.50 uIU/mL

## 2020-08-22 ENCOUNTER — Other Ambulatory Visit: Payer: Self-pay

## 2020-08-22 ENCOUNTER — Encounter: Payer: Self-pay | Admitting: Family Medicine

## 2020-08-22 ENCOUNTER — Ambulatory Visit (INDEPENDENT_AMBULATORY_CARE_PROVIDER_SITE_OTHER): Payer: Medicare Other | Admitting: Family Medicine

## 2020-08-22 VITALS — BP 126/72 | HR 75 | Resp 18 | Ht 64.0 in | Wt 242.0 lb

## 2020-08-22 DIAGNOSIS — Z23 Encounter for immunization: Secondary | ICD-10-CM

## 2020-08-22 DIAGNOSIS — Z1329 Encounter for screening for other suspected endocrine disorder: Secondary | ICD-10-CM | POA: Diagnosis not present

## 2020-08-22 DIAGNOSIS — E1165 Type 2 diabetes mellitus with hyperglycemia: Secondary | ICD-10-CM

## 2020-08-22 DIAGNOSIS — Z0000 Encounter for general adult medical examination without abnormal findings: Secondary | ICD-10-CM

## 2020-08-22 DIAGNOSIS — E785 Hyperlipidemia, unspecified: Secondary | ICD-10-CM

## 2020-08-22 DIAGNOSIS — E1169 Type 2 diabetes mellitus with other specified complication: Secondary | ICD-10-CM | POA: Diagnosis not present

## 2020-08-22 DIAGNOSIS — I10 Essential (primary) hypertension: Secondary | ICD-10-CM | POA: Diagnosis not present

## 2020-08-22 DIAGNOSIS — Z794 Long term (current) use of insulin: Secondary | ICD-10-CM

## 2020-08-23 ENCOUNTER — Encounter: Payer: Self-pay | Admitting: Family Medicine

## 2020-08-23 LAB — LIPID PANEL
Cholesterol: 97 mg/dL (ref 0–200)
HDL: 38.8 mg/dL — ABNORMAL LOW (ref >39.00)
LDL Cholesterol: 44 mg/dL (ref 0–99)
NonHDL: 58.46
Total CHOL/HDL Ratio: 3
Triglycerides: 74 mg/dL (ref 0.0–149.0)
VLDL: 14.8 mg/dL (ref 0.0–40.0)

## 2020-08-23 LAB — TSH: TSH: 2.57 u[IU]/mL (ref 0.35–4.50)

## 2020-08-25 DIAGNOSIS — M2550 Pain in unspecified joint: Secondary | ICD-10-CM | POA: Diagnosis not present

## 2020-08-25 DIAGNOSIS — M0579 Rheumatoid arthritis with rheumatoid factor of multiple sites without organ or systems involvement: Secondary | ICD-10-CM | POA: Diagnosis not present

## 2020-08-25 DIAGNOSIS — R5382 Chronic fatigue, unspecified: Secondary | ICD-10-CM | POA: Diagnosis not present

## 2020-08-25 LAB — BASIC METABOLIC PANEL
BUN: 14 (ref 4–21)
CO2: 26 — AB (ref 13–22)
Chloride: 99 (ref 99–108)
Creatinine: 1.2 — AB (ref 0.5–1.1)
Glucose: 222
Potassium: 4.5 (ref 3.4–5.3)
Sodium: 139 (ref 137–147)

## 2020-08-25 LAB — CBC AND DIFFERENTIAL
HCT: 31 — AB (ref 36–46)
Hemoglobin: 9.2 — AB (ref 12.0–16.0)
Platelets: 349 (ref 150–399)
WBC: 9.2

## 2020-08-25 LAB — COMPREHENSIVE METABOLIC PANEL
Calcium: 9.7 (ref 8.7–10.7)
GFR calc Af Amer: 56

## 2020-08-25 LAB — HEPATIC FUNCTION PANEL
ALT: 30 (ref 7–35)
AST: 18 (ref 13–35)
Alkaline Phosphatase: 111 (ref 25–125)

## 2020-09-06 ENCOUNTER — Encounter: Payer: Self-pay | Admitting: Family Medicine

## 2020-09-13 ENCOUNTER — Other Ambulatory Visit: Payer: Self-pay | Admitting: Internal Medicine

## 2020-09-14 NOTE — Telephone Encounter (Signed)
Ok to refill. It does not show that it have been prescribed by Dr Kelton Pillar. Last seen on 06/17/2020. Next appointment on 09/23/2020

## 2020-09-14 NOTE — Telephone Encounter (Signed)
Spoken to patient earlier this morning. She stated that she have received the pump and do not the status of the order, she stated that she waiting to hear back from [facility].  So for right now, patient would like to pen.

## 2020-09-14 NOTE — Telephone Encounter (Signed)
Can you please check with the pt if she is on a pump or not  yet? With a pump its better to have vials rather then pens

## 2020-09-23 ENCOUNTER — Other Ambulatory Visit: Payer: Self-pay

## 2020-09-23 ENCOUNTER — Encounter: Payer: Self-pay | Admitting: Internal Medicine

## 2020-09-23 ENCOUNTER — Ambulatory Visit: Payer: Medicare Other | Admitting: Internal Medicine

## 2020-09-23 VITALS — BP 130/76 | HR 88 | Ht 64.0 in | Wt 246.0 lb

## 2020-09-23 DIAGNOSIS — E1165 Type 2 diabetes mellitus with hyperglycemia: Secondary | ICD-10-CM | POA: Diagnosis not present

## 2020-09-23 DIAGNOSIS — E1139 Type 2 diabetes mellitus with other diabetic ophthalmic complication: Secondary | ICD-10-CM | POA: Diagnosis not present

## 2020-09-23 DIAGNOSIS — E1159 Type 2 diabetes mellitus with other circulatory complications: Secondary | ICD-10-CM

## 2020-09-23 DIAGNOSIS — Z794 Long term (current) use of insulin: Secondary | ICD-10-CM | POA: Diagnosis not present

## 2020-09-23 LAB — POCT GLYCOSYLATED HEMOGLOBIN (HGB A1C): Hemoglobin A1C: 8.5 % — AB (ref 4.0–5.6)

## 2020-09-23 NOTE — Patient Instructions (Addendum)
-   Continue Lantus to 54 units daily  - Continue Victoza 1.8 mg daily  - Humalog correctional insulin: . Use the scale below to help guide you:   Blood sugar before meal Number of units to inject  Less than 150 0 unit  151 -  170 1 units  171 -  190 2 units  191 -  210 3 units  211 -  230 4 units  231 -  250 5 units  251 -  270 6 units  271 -  290 7 units  291 -  310 8 units  311- 330 9 units         HOW TO TREAT LOW BLOOD SUGARS (Blood sugar LESS THAN 70 MG/DL)  Please follow the RULE OF 15 for the treatment of hypoglycemia treatment (when your (blood sugars are less than 70 mg/dL)    STEP 1: Take 15 grams of carbohydrates when your blood sugar is low, which includes:   3-4 GLUCOSE TABS  OR  3-4 OZ OF JUICE OR REGULAR SODA OR  ONE TUBE OF GLUCOSE GEL     STEP 2: RECHECK blood sugar in 15 MINUTES STEP 3: If your blood sugar is still low at the 15 minute recheck --> then, go back to STEP 1 and treat AGAIN with another 15 grams of carbohydrates.

## 2020-09-23 NOTE — Progress Notes (Signed)
Name: Lynn [name]  Age/ Sex: 64 y.o., female   MRN/ DOB: [id number], Jun 09, 1956     PCP: [name], MD   Reason for Endocrinology Evaluation: Type 2 Diabetes Mellitus  Initial Endocrine Consultative Visit: 03/31/2019    PATIENT IDENTIFIER: Lynn [name] is a 64 y.o. female with a past medical history of T2DM, HTN, dyslipidemia,thalassemia, OSA on CPAP . The patient has followed with Endocrinology clinic since 03/31/2019 for consultative assistance with management of her diabetes.  DIABETIC HISTORY:  Lynn [name] was diagnosed with T2DM in 2010, She has been on metformin in the past with reported prior intolerance due to diarrhea. Was on Glipizide at some point with no side effects.Has been on insulin for years. Her hemoglobin A1c has ranged from  7.7% in 2018, peaking at 10.4% in 2020.  On her initial presentation to our clinic, her A1c was 10.4% , she was on Lantus and Victoza    [name] was tried in 03/2019 but developed severe dizziness .     Injectable methotrexate was started November 2020 due to rheumatoid arthritis.  Patient is intolerant to oral methotrexate   Attempted to get the Tandem pump but due to elevated C-peptide insurance declined.   SUBJECTIVE:   During the last visit (06/17/2020): A1c 8.6 % we continued  Lantus and  Victoza, restarted Humalog      Today (09/23/2020): Lynn [name] is here for a month follow up on diabetes management. She missed a 3 month follow up back in 02/2020.     She checks her blood sugars multiple times daily through CGM. The patient has not had hypoglycemic episodes since the last clinic visit.     HOME DIABETES REGIMEN:  Lantus 54 units daily  Victoza 1.8 mg daily  Humalog 16 units daily - has been taking 5 units only if her Bg is more then 280 mg/dL      CONTINUOUS GLUCOSE MONITORING RECORD INTERPRETATION    Dates of Recording: 11/27-12/07/2020  Sensor description:Freestyle libre  Results  statistics:   CGM use % of time 100  Average and SD 214/8.4  Time in range   34  %  % Time Above 180 36  % Time above 250 30  % Time Below target 0    Glycemic patterns summary: Glucose within goal at night , but  hyperglycemia is noted after meals Hyperglycemic episodes  Post prandial  Hypoglycemic episodes occurred N/A  Overnight periods: Optimal   HISTORY:  Past Medical History:  Past Medical History:  Diagnosis Date  . Anginal pain ([place])   . Anxiety   . Arthritis    "knees, wrists, back, elbows" (07/03/2017)  . Clotting disorder ([place])    blood clot in eye 2016  . Coronary artery disease, non-occlusive    a. 11/2016 NSTEMI/Cath: LM nl, LAD 40p, 48md, D1/2 small, LCX 40ost, OM2/3 nl/small, RCA 35 ost/mid, RPDA/RPL small/nl.  . Depression   . Diastolic dysfunction    a. 11/2016 Echo: EF 55-60%, gr1 DD, sev calcified MV annulus, mildly dil LA.  Marland Kitchen Eye hemorrhage 01/2015   "right; resolved" (07/03/2017)  . Headache    "monthly" (07/03/2017)  . Heart murmur   . History of blood transfusion    "when I had an ectopic pregnancy"  . History of stomach ulcers   . Hyperlipidemia   . Hypertension   . Microcytic anemia   . OSA on CPAP    setting is unknown  . Pneumonia    "several  times" (07/03/2017)  . Syncope 07/03/2017 X 2   called seizures but no medications  . Thalassemia    "my cells are sickle cell shaped but I don't have sickle cell anemia" (07/03/2017)  . Type II diabetes mellitus ([place])    Past Surgical History:  Past Surgical History:  Procedure Laterality Date  . 48-Hour Monitor  03/18/2017   Sinus rhythm with sinus tachycardia (rate 58-134 BPM)multiple PVCs noted with couplets and bigeminy. One triplet. 7 runs of PAT ranging from 100-130 bpm. Longest was 33 beats.  Marland Kitchen BREAST BIOPSY Left   . CARDIAC CATHETERIZATION N/A 11/19/2016   Procedure: Left Heart Cath and Coronary Angiography;  Surgeon: Belva Crome, MD;  Location: [facility];  Service:  Cardiovascular.    LM nl, LAD 40p, 5md, D1/2 small, LCX 40ost, OM2/3 nl/small, RCA 35 ost/mid, RPDA/RPL small/nl.  . CARPAL TUNNEL RELEASE Right 11/01/2014  . COLONOSCOPY    . DILATION AND CURETTAGE OF UTERUS    . ECTOPIC PREGNANCY SURGERY  X 2  . JOINT REPLACEMENT    . KNEE ARTHROSCOPY Left 11/2014  . TONSILLECTOMY    . TOTAL KNEE ARTHROPLASTY Right 02/18/2015   Procedure: RIGHT TOTAL KNEE ARTHROPLASTY;  Surgeon: Netta Cedars, MD;  Location: [facility];  Service: Orthopedics;  Laterality: Right;  . TOTAL KNEE ARTHROPLASTY Left 08/19/2015   Procedure: LEFT TOTAL KNEE ARTHROPLASTY;  Surgeon: Netta Cedars, MD;  Location: [facility];  Service: Orthopedics;  Laterality: Left;  . TRANSTHORACIC ECHOCARDIOGRAM  11/2016; 07/2017:   a) normal LV size, thickness and function. EF 55-60%. GR 1 DD.No RWMA severe mitral annular calcification, but no mitral stenosis. Mild LA dilation;; b) normal LV size and function.  EF 65-75%.  Unable to assess diastolic function.  Aortic sclerosis with no stenosis.  Moderate mitral stenosis? (Gradient 9 mmHg) -?  Mild-mod left atrial enlargement   . TRANSTHORACIC ECHOCARDIOGRAM  01/2019   EF 65 to 70%.  Normal function.  Elevated LVEDP-GR 1 DD.  Normal RV size and function.  Large calcific mass on posterior mitral leaflet mild to moderate mitral stenosis.  Marland Kitchen VAGINAL HYSTERECTOMY      Social History:  reports that she quit smoking about 5 years ago. Her smoking use included cigarettes. She has a 11.00 pack-year smoking history. She has never used smokeless tobacco. She reports current alcohol use. She reports that she does not use drugs. Family History:  Family History  Problem Relation Age of Onset  . Cancer Mother   . Diabetes Mother   . Hypertension Mother   . Hyperlipidemia Mother   . Cancer Father   . Hyperlipidemia Father   . Mental illness Sister   . Diabetes Sister   . Diabetes Maternal Grandmother   . Diabetes Maternal Grandfather   . Colon cancer Neg Hx   .  Esophageal cancer Neg Hx   . Stomach cancer Neg Hx   . Rectal cancer Neg Hx      HOME MEDICATIONS: Allergies as of 09/23/2020      Reactions   Penicillins Hives   Childhood allergy Has patient had a PCN reaction causing immediate rash, facial/tongue/throat swelling, SOB or lightheadedness with hypotension: Yes Has patient had a PCN reaction causing severe rash involving mucus membranes or skin necrosis: No Has patient had a PCN reaction that required hospitalization No Has patient had a PCN reaction occurring within the last 10 years: No If all of the above answers are "NO", then may proceed with Cephalosporin use.  Farxiga [dapagliflozin] Other (See Comments)   Dizzy and lethargic   Metformin And Related    Diarrhea, bleeding      Medication List       Accurate as of September 23, 2020  3:16 PM. If you have any questions, ask your nurse or doctor.        atorvastatin 80 MG tablet Commonly known as: LIPITOR TAKE 1 TABLET(80 MG) BY MOUTH DAILY AT 6 PM   B-D UF III MINI PEN NEEDLES 31G X 5 MM Misc Generic drug: Insulin Pen Needle USE DAILY WITH VICTOZA AND LANTUS.   Insulin Pen Needle 31G X 5 MM Misc 1 Device by Does not apply route 3 (three) times daily.   cetirizine 10 MG tablet Commonly known as: ZYRTEC Take 10 mg by mouth daily as needed for allergies.   Contour Next Test test strip Generic drug: glucose blood 3x daily   Dexcom G6 Sensor Misc 1 Device by Does not apply route as directed.   Dexcom G6 Transmitter Misc 1 Device by Does not apply route as directed.   diltiazem 240 MG 24 hr capsule Commonly known as: CARDIZEM CD Take 1 capsule (240 mg total) by mouth daily.   folic acid 1 MG tablet Commonly known as: FOLVITE Take 2 tablets (2 mg total) by mouth daily.   HumaLOG KwikPen 100 UNIT/ML KwikPen Generic drug: insulin lispro INJECT 0.15 MLS (15 UNITS TOTAL) INTO THE SKIN 3 (THREE) TIMES DAILY.   hydrochlorothiazide 12.5 MG tablet Commonly known  as: HYDRODIURIL Take one by mouth daily   isosorbide mononitrate 30 MG 24 hr tablet Commonly known as: IMDUR Take 30 mg  tablet as needed for Chest pain spasm episodes for 3 days and the stop   Lancets 30G Misc 1 Device by Does not apply route 3 (three) times daily.   Lantus SoloStar 100 UNIT/ML Solostar Pen Generic drug: insulin glargine Inject 60 Units into the skin daily.   losartan 100 MG tablet Commonly known as: COZAAR Take 1 tablet (100 mg total) by mouth daily.   methotrexate 50 MG/2ML injection   multivitamin with minerals Tabs tablet Take 1 tablet by mouth daily. Reported on 01/03/2016   olopatadine 0.1 % ophthalmic solution Commonly known as: PATANOL prn   PRESCRIPTION MEDICATION Inhale into the lungs at bedtime. CPAP   SAFETY-LOK TB SYRINGE 27GX.5" 27G X 1/2" 1 ML Misc Generic drug: TUBERCULIN SYR 1CC/27GX1/2"   T: slim X2 Ins Pmp/Control 7.4 Devi Use daily as needed directed   Victoza 18 MG/3ML Sopn Generic drug: liraglutide Inject 0.3 mLs (1.8 mg total) into the skin daily.        OBJECTIVE:   Vital Signs: BP 130/76   Pulse 88   Ht 5\' 4"  (1.626 m)   Wt 246 lb (111.6 kg)   LMP  (LMP Unknown)   SpO2 98%   BMI 42.23 kg/m   Wt Readings from Last 3 Encounters:  09/23/20 246 lb (111.6 kg)  08/22/20 242 lb (109.8 kg)  08/09/20 245 lb (111.1 kg)     Exam: General: Pt appears well and is in NAD  Lungs: Clear with good BS bilat with no rales, rhonchi, or wheezes  Heart: RRR  Extremities: No pretibial edema  Neuro: MS is good with appropriate affect, pt is alert and Ox3       DM foot exam: 12/02/2019 The skin of the feet is intact without sores or ulcerations. The pedal pulses are 2+ on right and 2+ on left. The  sensation is intact to a screening 5.07, 10 gram monofilament bilaterally    DATA REVIEWED:  Lab Results  Component Value Date   HGBA1C 8.5 (A) 09/23/2020   HGBA1C 8.6 (A) 06/17/2020   HGBA1C 9.8 (H) 03/21/2020   Lab  Results  Component Value Date   MICROALBUR <0.7 03/23/2019   LDLCALC 44 08/22/2020   CREATININE 1.2 (A) 08/25/2020   Lab Results  Component Value Date   MICRALBCREAT 1.4 03/23/2019     Lab Results  Component Value Date   CHOL 97 08/22/2020   HDL 38.80 (L) 08/22/2020   LDLCALC 44 08/22/2020   LDLDIRECT 131 (H) 09/02/2013   TRIG 74.0 08/22/2020   CHOLHDL 3 08/22/2020         ASSESSMENT / PLAN / RECOMMENDATIONS:   1) Type 2 Diabetes Mellitus, Poorly controlled, With retinopathy and macrovascular complications - Most recent A1c of 8.5 %. Goal A1c <7.0 %.     - A1c stable  - She has tried to obtain T-slim but was declined by her insurance due to elevated C-Peptide  - She is intolerant to Metformin and SGLT_2 inhibitors - She used 5 units of prandial insulin only if her pre-meal BG is over 280 mg/dL because she is trying to minimize the amount of daily injections, hence the hyperglycemia.  - No changes today, will provide a new correctional scale as she has not been using a standing dose of 16 units - She would like to repeat her Serum Glucose and C-peptide level in January    MEDICATIONS: -Continue Lantus 54  units daily  -Continue Victoza 1.8 mg daily  - Correctiona factor: Humalog (BG -130/20)    EDUCATION / INSTRUCTIONS:  BG monitoring instructions: Patient is instructed to check her blood sugars 3 times a day, fasting , supper and bedtime.  Call [facility] if: BG persistently < 70  . I reviewed the Rule of 15 for the treatment of hypoglycemia in detail with the patient. Literature supplied.   F/U in 4 months    Signed electronically by: Mack Guise, MD  [facility]  [facility] [address] [place], [place] [address] Phone: [phone] FAX: [phone]   CC: [name], [place] [address] [place]  [address] Phone: [phone]  Fax: [phone]  Return to  Endocrinology clinic as below: Future Appointments  Date Time Provider [place]  11/10/2020  2:00 PM Leonie Man, MD [facility]  02/20/2021  2:00 PM Copland, Gay Filler, MD [facility]

## 2020-10-03 ENCOUNTER — Telehealth: Payer: Self-pay | Admitting: Internal Medicine

## 2020-10-03 NOTE — Telephone Encounter (Signed)
Lynn [name] from [facility] came into the office requesting a signature for this patient's Omnipod dash pods - Dr Kelton Pillar is out of office so the on call Dr - Dr Dwyane Dee was able to sign. FYI

## 2020-10-04 DIAGNOSIS — E1165 Type 2 diabetes mellitus with hyperglycemia: Secondary | ICD-10-CM | POA: Diagnosis not present

## 2020-10-04 DIAGNOSIS — Z794 Long term (current) use of insulin: Secondary | ICD-10-CM | POA: Diagnosis not present

## 2020-10-19 ENCOUNTER — Encounter: Payer: Self-pay | Admitting: Internal Medicine

## 2020-10-20 ENCOUNTER — Telehealth: Payer: Self-pay | Admitting: Internal Medicine

## 2020-10-20 NOTE — Telephone Encounter (Signed)
Patient requests an appointment for pump training - please call her to schedule at phone # [phone].

## 2020-10-24 ENCOUNTER — Telehealth: Payer: Self-pay | Admitting: Nutrition

## 2020-10-24 NOTE — Telephone Encounter (Signed)
Appointment scheduled for pump training on 1/17.  Pt. Told to bring supplies, review manual, and stop Lantus the night before.  She had no final questions

## 2020-10-31 ENCOUNTER — Encounter: Payer: Medicare Other | Admitting: Nutrition

## 2020-11-03 ENCOUNTER — Encounter: Payer: Self-pay | Admitting: Internal Medicine

## 2020-11-03 NOTE — Telephone Encounter (Signed)
Lynn [name],   Please let [facility] know that pt would like to have supplies handled through them. They usually sends me forms to fill out . Please let the  pt know when this is done. She will need to contact them when she is ready to order her supplies.

## 2020-11-04 ENCOUNTER — Telehealth: Payer: Self-pay | Admitting: Internal Medicine

## 2020-11-04 NOTE — Telephone Encounter (Signed)
-----   Message from Ocie Doyne, RN sent at 11/04/2020  7:29 AM EST ----- I will be starting her on her Dash pump on Monday morning.  Can you give me pump start orders for her and I will put the order form on your desk when I get in on Monday.  Thank you.  Hope you have a good weekend.

## 2020-11-04 NOTE — Telephone Encounter (Signed)
Hi [name],   I am sending this as a telephone message so that I can have this data in her chart for me.      Basal rate 1.7 u/hr I: C ratio 1:12 Sensitivity factor 20 Active insulin time 4 hr      Thanks Lutricia Horsfall, MD  [facility]  [facility] [address] [place], [place] [address] Phone: [phone] FAX: [phone]

## 2020-11-07 ENCOUNTER — Other Ambulatory Visit: Payer: Self-pay

## 2020-11-07 ENCOUNTER — Encounter: Payer: Medicare Other | Attending: Internal Medicine | Admitting: Nutrition

## 2020-11-07 DIAGNOSIS — Z794 Long term (current) use of insulin: Secondary | ICD-10-CM

## 2020-11-07 DIAGNOSIS — E1165 Type 2 diabetes mellitus with hyperglycemia: Secondary | ICD-10-CM | POA: Insufficient documentation

## 2020-11-07 NOTE — Telephone Encounter (Signed)
Patient was with Vaughan Basta today and she stated that she will call [facility] then let us know.

## 2020-11-07 NOTE — Patient Instructions (Addendum)
Read over handouts given on pump therapy, and carb counting. Call if readings drop below 70 or remain over 250.   Review resource manual and call pump help line if questions.

## 2020-11-07 NOTE — Telephone Encounter (Signed)
n

## 2020-11-07 NOTE — Progress Notes (Signed)
Patient is here today to be trained on her Dash pump. She is wearing a Dexcom sensor.  We discussed how the pump delivers insulin and how the pump calculates a bolus amount using the IOB as well carbs eaten, and current blood sugar reading.  She reported good understanding of this.     Settings were put into the pump by the patient as per Dr. Quin Hoop orders:  Basal rate: 1.7u/hr, ISF: 20, I/C: 12, timing 4 hours.   She did not take her long acting insulin last night.  She filled a pod using her Humalog insulin, and attached it to her outer left arm.  Her blood sugar was 346, and she did a correction dose as per pump settings.  She re demonstrated how to give a meal time bolus X2 correctly. We also discussed how and when to use the temp basal rate, high blood sugar protocol, sick day guidelines and how to start/stop the pump.  She had reported that she had read the resource manual X2 already and had no final questions.  Handouts were given on the above topics and I will call her this evening to see how she is doing.  She was told to call the office if blood sugars drop low, or remain over 250.  She agreed to do this.   Information also given on carb counting, and we discussed/reviewed what carbs were, and the handouts given on 15 gram portions and carb counting in general.  She admits that she does not do this all of the time, and she was told that it is important to do this correctly, and for each meal and snacks eaten.  She agreed to review the information given on this and call if questions. She signed the checklist as understanding all topics and had no final questions

## 2020-11-08 ENCOUNTER — Telehealth: Payer: Self-pay | Admitting: Nutrition

## 2020-11-08 MED ORDER — INSULIN LISPRO 100 UNIT/ML ~~LOC~~ SOLN: SUBCUTANEOUS | 3 refills | Status: DC

## 2020-11-08 NOTE — Telephone Encounter (Signed)
Thank you Vaughan Basta. I refilled her HUmalog and sent a message to admin pool to schedule her to see me in 2 weeks    Thanks again

## 2020-11-08 NOTE — Addendum Note (Signed)
Addended by: Dorita Sciara on: 11/08/2020 09:12 AM   Modules accepted: Orders

## 2020-11-08 NOTE — Telephone Encounter (Signed)
Patient reported that FBS today was 123.  She ate a late breakfast, and said her blood sugar was now 349 1 1/2 hr. PcB.  She did a correction dose, but pump said no insulin needed at this time due to IOB.  She had miscounted her carbs for breakfast by 15, and saw her mistake.  She reports that she had no difficulty in wearing the pump, or using the bolus calculator, and had no questions for me at this time.  She was encouraged to call the 800 number if more questions and will call me in one week to let me know how she is doing.

## 2020-11-08 NOTE — Telephone Encounter (Signed)
Patient reported that she had no difficulty giving bolus for lunch and super.  Blood sugar acS was 190, but blood sugar now (3hr. PcS) was 327.  She was told to look at last bolus given and it was at 12:45.  She reported that she must not have pressed the last button "start".  We reviewed the steps to give the bolus and she reported good understanding of this.  She did a correction bolus while on the phone with me and said she would call me in the AM after breakfast to let me know how she is doing.  She reported that she had read over all information given and was advised to review the resource guide while giving the next bolus.  She agreed to do this and had no final questions.

## 2020-11-09 ENCOUNTER — Encounter: Payer: Self-pay | Admitting: Internal Medicine

## 2020-11-10 ENCOUNTER — Ambulatory Visit: Payer: Medicare Other | Admitting: Cardiology

## 2020-11-24 DIAGNOSIS — R5382 Chronic fatigue, unspecified: Secondary | ICD-10-CM | POA: Diagnosis not present

## 2020-11-24 DIAGNOSIS — M2550 Pain in unspecified joint: Secondary | ICD-10-CM | POA: Diagnosis not present

## 2020-11-24 DIAGNOSIS — M0579 Rheumatoid arthritis with rheumatoid factor of multiple sites without organ or systems involvement: Secondary | ICD-10-CM | POA: Diagnosis not present

## 2020-12-05 ENCOUNTER — Other Ambulatory Visit: Payer: Self-pay

## 2020-12-07 ENCOUNTER — Other Ambulatory Visit: Payer: Self-pay

## 2020-12-07 ENCOUNTER — Ambulatory Visit: Payer: Medicare Other | Admitting: Internal Medicine

## 2020-12-07 ENCOUNTER — Encounter: Payer: Self-pay | Admitting: Internal Medicine

## 2020-12-07 VITALS — BP 126/70 | HR 86 | Ht 64.0 in | Wt 239.5 lb

## 2020-12-07 DIAGNOSIS — E1159 Type 2 diabetes mellitus with other circulatory complications: Secondary | ICD-10-CM

## 2020-12-07 DIAGNOSIS — E1139 Type 2 diabetes mellitus with other diabetic ophthalmic complication: Secondary | ICD-10-CM

## 2020-12-07 DIAGNOSIS — E1165 Type 2 diabetes mellitus with hyperglycemia: Secondary | ICD-10-CM

## 2020-12-07 DIAGNOSIS — Z794 Long term (current) use of insulin: Secondary | ICD-10-CM

## 2020-12-07 NOTE — Patient Instructions (Signed)
  Pump   MEDTRONIC   Settings   Insulin type   NOVOLOG    Basal rate       0000 2.05              I:C ratio       0000-0600  1:12                   Sensitivity       0000  20      Goal       0000  100- 110

## 2020-12-07 NOTE — Progress Notes (Unsigned)
Name: Lynn [name]  Age/ Sex: 65 y.o., female   MRN/ DOB: [id number], Nov 23, 1955     PCP: [name], MD   Reason for Endocrinology Evaluation: Type 2 Diabetes Mellitus  Initial Endocrine Consultative Visit: 03/31/2019    PATIENT IDENTIFIER: Lynn [name] is a 65 y.o. female with a past medical history of T2DM, HTN, dyslipidemia,thalassemia, OSA on CPAP . The patient has followed with Endocrinology clinic since 03/31/2019 for consultative assistance with management of her diabetes.  DIABETIC HISTORY:  [name] was diagnosed with T2DM in 2010, She has been on metformin in the past with reported prior intolerance due to diarrhea. Was on Glipizide at some point with no side effects.Has been on insulin for years. Her hemoglobin A1c has ranged from  7.7% in 2018, peaking at 10.4% in 2020.  On her initial presentation to our clinic, her A1c was 10.4% , she was on Lantus and Victoza    [name] was tried in 03/2019 but developed severe dizziness .     Injectable methotrexate was started November 2020 due to rheumatoid arthritis.  Patient is intolerant to oral methotrexate   Started on Omnipod 10/2020  SUBJECTIVE:   During the last visit (06/17/2020): A1c 8.6 % we continued  Lantus and  Victoza, restarted Humalog      Today (12/07/2020): Ms. [name] is here for a month follow up on diabetes management. She missed a 3 month follow up back in 02/2020.     She checks her blood sugars multiple times daily through CGM. The patient has not had hypoglycemic episodes since the last clinic visit.     HOME DIABETES REGIMEN:  Victoza 1.8 mg daily  Humalog     This patient with type 2 diabetes is treated with omnipod (insulin pump). During the visit the pump basal and bolus doses were reviewed including carb/insulin rations and supplemental doses. The clinical list was updated. The glucose meter download was reviewed in detail to determine if the current pump  settings are providing the best glycemic control without excessive hypoglycemia.  Pump and meter download:    Pump    Omni pod Settings   Insulin type    Humalog   Basal rate       0000 1.7              I:C ratio       0000-0600  1:12                   Sensitivity       0000  20      Goal       0000  100- 110            Type & Model of Pump: Omnipod Insulin Type: Currently using Humalog   Body mass index is 41.11 kg/m.  PUMP STATISTICS: Average BG: 259  BG Readings: 5.5 / day Average Daily Carbs (g): 55  Average Total Daily Insulin: 66.3  Average Daily Basal: 37.7 (61 %) Average Daily Bolus: 23.9 (39 %)      CONTINUOUS GLUCOSE MONITORING RECORD INTERPRETATION    Dates of Recording: 2/10-2/23/2022  Sensor description:Dexcom  Results statistics:   CGM use % of time 100  Average and SD 223/64  Time in range   31 %  % Time Above 180 37  % Time above 250 32  % Time Below target 0    Glycemic patterns summary: Hyperglycemia through the day and night but worse  after meals  Hyperglycemic episodes  Post prandial  Hypoglycemic episodes occurred N/A  Overnight periods: High    HISTORY:  Past Medical History:  Past Medical History:  Diagnosis Date  . Anginal pain ([place])   . Anxiety   . Arthritis    "knees, wrists, back, elbows" (07/03/2017)  . Clotting disorder ([place])    blood clot in eye 2016  . Coronary artery disease, non-occlusive    a. 11/2016 NSTEMI/Cath: LM nl, LAD 40p, 36md, D1/2 small, LCX 40ost, OM2/3 nl/small, RCA 35 ost/mid, RPDA/RPL small/nl.  . Depression   . Diastolic dysfunction    a. 11/2016 Echo: EF 55-60%, gr1 DD, sev calcified MV annulus, mildly dil LA.  Marland Kitchen Eye hemorrhage 01/2015   "right; resolved" (07/03/2017)  . Headache    "monthly" (07/03/2017)  . Heart murmur   . History of blood transfusion    "when I had an ectopic pregnancy"  . History of stomach ulcers   . Hyperlipidemia   . Hypertension   . Microcytic anemia    . OSA on CPAP    setting is unknown  . Pneumonia    "several times" (07/03/2017)  . Syncope 07/03/2017 X 2   called seizures but no medications  . Thalassemia    "my cells are sickle cell shaped but I don't have sickle cell anemia" (07/03/2017)  . Type II diabetes mellitus ([place])    Past Surgical History:  Past Surgical History:  Procedure Laterality Date  . 48-Hour Monitor  03/18/2017   Sinus rhythm with sinus tachycardia (rate 58-134 BPM)multiple PVCs noted with couplets and bigeminy. One triplet. 7 runs of PAT ranging from 100-130 bpm. Longest was 33 beats.  Marland Kitchen BREAST BIOPSY Left   . CARDIAC CATHETERIZATION N/A 11/19/2016   Procedure: Left Heart Cath and Coronary Angiography;  Surgeon: Belva Crome, MD;  Location: [facility];  Service: Cardiovascular.    LM nl, LAD 40p, 57md, D1/2 small, LCX 40ost, OM2/3 nl/small, RCA 35 ost/mid, RPDA/RPL small/nl.  . CARPAL TUNNEL RELEASE Right 11/01/2014  . COLONOSCOPY    . DILATION AND CURETTAGE OF UTERUS    . ECTOPIC PREGNANCY SURGERY  X 2  . JOINT REPLACEMENT    . KNEE ARTHROSCOPY Left 11/2014  . TONSILLECTOMY    . TOTAL KNEE ARTHROPLASTY Right 02/18/2015   Procedure: RIGHT TOTAL KNEE ARTHROPLASTY;  Surgeon: Netta Cedars, MD;  Location: [facility];  Service: Orthopedics;  Laterality: Right;  . TOTAL KNEE ARTHROPLASTY Left 08/19/2015   Procedure: LEFT TOTAL KNEE ARTHROPLASTY;  Surgeon: Netta Cedars, MD;  Location: [facility];  Service: Orthopedics;  Laterality: Left;  . TRANSTHORACIC ECHOCARDIOGRAM  11/2016; 07/2017:   a) normal LV size, thickness and function. EF 55-60%. GR 1 DD.No RWMA severe mitral annular calcification, but no mitral stenosis. Mild LA dilation;; b) normal LV size and function.  EF 65-75%.  Unable to assess diastolic function.  Aortic sclerosis with no stenosis.  Moderate mitral stenosis? (Gradient 9 mmHg) -?  Mild-mod left atrial enlargement   . TRANSTHORACIC ECHOCARDIOGRAM  01/2019   EF 65 to 70%.  Normal function.  Elevated  LVEDP-GR 1 DD.  Normal RV size and function.  Large calcific mass on posterior mitral leaflet mild to moderate mitral stenosis.  Marland Kitchen VAGINAL HYSTERECTOMY      Social History:  reports that she quit smoking about 5 years ago. Her smoking use included cigarettes. She has a 11.00 pack-year smoking history. She has never used smokeless tobacco. She reports current alcohol use. She  reports that she does not use drugs. Family History:  Family History  Problem Relation Age of Onset  . Cancer Mother   . Diabetes Mother   . Hypertension Mother   . Hyperlipidemia Mother   . Cancer Father   . Hyperlipidemia Father   . Mental illness Sister   . Diabetes Sister   . Diabetes Maternal Grandmother   . Diabetes Maternal Grandfather   . Colon cancer Neg Hx   . Esophageal cancer Neg Hx   . Stomach cancer Neg Hx   . Rectal cancer Neg Hx      HOME MEDICATIONS: Allergies as of 12/07/2020      Reactions   Penicillins Hives   Childhood allergy Has patient had a PCN reaction causing immediate rash, facial/tongue/throat swelling, SOB or lightheadedness with hypotension: Yes Has patient had a PCN reaction causing severe rash involving mucus membranes or skin necrosis: No Has patient had a PCN reaction that required hospitalization No Has patient had a PCN reaction occurring within the last 10 years: No If all of the above answers are "NO", then may proceed with Cephalosporin use.   Farxiga [dapagliflozin] Other (See Comments)   Dizzy and lethargic   Metformin And Related    Diarrhea, bleeding      Medication List       Accurate as of December 07, 2020  1:00 PM. If you have any questions, ask your nurse or doctor.        atorvastatin 80 MG tablet Commonly known as: LIPITOR TAKE 1 TABLET(80 MG) BY MOUTH DAILY AT 6 PM   B-D UF III MINI PEN NEEDLES 31G X 5 MM Misc Generic drug: Insulin Pen Needle USE DAILY WITH VICTOZA AND LANTUS.   Insulin Pen Needle 31G X 5 MM Misc 1 Device by Does not apply  route 3 (three) times daily.   cetirizine 10 MG tablet Commonly known as: ZYRTEC Take 10 mg by mouth daily as needed for allergies.   Contour Next Test test strip Generic drug: glucose blood 3x daily   Dexcom G6 Sensor Misc 1 Device by Does not apply route as directed.   Dexcom G6 Transmitter Misc 1 Device by Does not apply route as directed.   diltiazem 240 MG 24 hr capsule Commonly known as: CARDIZEM CD Take 1 capsule (240 mg total) by mouth daily.   folic acid 1 MG tablet Commonly known as: FOLVITE Take 2 tablets (2 mg total) by mouth daily.   hydrochlorothiazide 12.5 MG tablet Commonly known as: HYDRODIURIL Take one by mouth daily   insulin lispro 100 UNIT/ML injection Commonly known as: HumaLOG Max daily 100 units per pump   isosorbide mononitrate 30 MG 24 hr tablet Commonly known as: IMDUR Take 30 mg  tablet as needed for Chest pain spasm episodes for 3 days and the stop   Lancets 30G Misc 1 Device by Does not apply route 3 (three) times daily.   losartan 100 MG tablet Commonly known as: COZAAR Take 1 tablet (100 mg total) by mouth daily.   methotrexate 50 MG/2ML injection   multivitamin with minerals Tabs tablet Take 1 tablet by mouth daily. Reported on 01/03/2016   olopatadine 0.1 % ophthalmic solution Commonly known as: PATANOL prn   PRESCRIPTION MEDICATION Inhale into the lungs at bedtime. CPAP   SAFETY-LOK TB SYRINGE 27GX.5" 27G X 1/2" 1 ML Misc Generic drug: TUBERCULIN SYR 1CC/27GX1/2"   Victoza 18 MG/3ML Sopn Generic drug: liraglutide Inject 0.3 mLs (1.8 mg total) into the  skin daily.        OBJECTIVE:   Vital Signs: BP 126/70   Pulse 86   Ht 5\' 4"  (1.626 m)   Wt 239 lb 8 oz (108.6 kg)   LMP  (LMP Unknown)   SpO2 98%   BMI 41.11 kg/m    Wt Readings from Last 3 Encounters:  09/23/20 246 lb (111.6 kg)  08/22/20 242 lb (109.8 kg)  08/09/20 245 lb (111.1 kg)     Exam: General: Pt appears well and is in NAD  Lungs: Clear  with good BS bilat with no rales, rhonchi, or wheezes  Heart: RRR  Extremities: No pretibial edema  Neuro: MS is good with appropriate affect, pt is alert and Ox3       DM foot exam: 12/02/2019 The skin of the feet is intact without sores or ulcerations. The pedal pulses are 2+ on right and 2+ on left. The sensation is intact to a screening 5.07, 10 gram monofilament bilaterally    DATA REVIEWED:  Lab Results  Component Value Date   HGBA1C 8.5 (A) 09/23/2020   HGBA1C 8.6 (A) 06/17/2020   HGBA1C 9.8 (H) 03/21/2020   Lab Results  Component Value Date   MICROALBUR <0.7 03/23/2019   LDLCALC 44 08/22/2020   CREATININE 1.2 (A) 08/25/2020   Lab Results  Component Value Date   MICRALBCREAT 1.4 03/23/2019     Lab Results  Component Value Date   CHOL 97 08/22/2020   HDL 38.80 (L) 08/22/2020   LDLCALC 44 08/22/2020   LDLDIRECT 131 (H) 09/02/2013   TRIG 74.0 08/22/2020   CHOLHDL 3 08/22/2020         ASSESSMENT / PLAN / RECOMMENDATIONS:   1) Type 2 Diabetes Mellitus, Poorly controlled, With retinopathy and macrovascular complications - Most recent A1c of 8.5 %. Goal A1c <7.0 %.     -Patient continues with hyperglycemia despite using the Omni pod, and review of her pump download she really boluses for meals, she assures me that she does not eat any carbs hence she does not enter any, for example there was no carb entry all day yesterday and she assures me that she has been eating celery cheese all day. Patient is requesting an increase in the basal, which assuming she is totally counting every carbs is an appropriate adjustment. -For example yesterday her BG reading went up 100 points between the hours of noon and 10 PM, from 249 mg/DL to 324 mg/DL.  -I have advised her in the future when her BG readings continue to rise despite not eating and despite giving boluses she will need to change the site of the pod within 2 hours. -She is going to go home today and she will change  the side of her pump -In the meantime we will adjust her basal rate    MEDICATIONS: Continue Victoza 1.8 mg daily Humalog per pump  Pump    Omni pod Settings   Insulin type    Humalog   Basal rate       0000 2.05              I:C ratio       0000-0600  1:12                   Sensitivity       0000  20      Goal       0000  100- 110  EDUCATION / INSTRUCTIONS:  BG monitoring instructions: Patient is instructed to check her blood sugars 3 times a day, fasting , supper and bedtime.  Call [facility] if: BG persistently < 70  . I reviewed the Rule of 15 for the treatment of hypoglycemia in detail with the patient. Literature supplied.   F/U in 4 months    Signed electronically by: Mack Guise, MD  [facility]  [facility] [address] [place], [place] [address] Phone: [phone] FAX: [phone]   CC: [name], [address] [place]  [address] Phone: [phone]  Fax: [phone]  Return to Endocrinology clinic as below: Future Appointments  Date Time Provider [place]  12/07/2020  2:00 PM [name], Melanie Crazier, MD [facility] None  01/30/2021  3:20 PM [name], Melanie Crazier, MD [facility] None  02/20/2021  2:00 PM Copland, Gay Filler, MD [facility]

## 2020-12-22 DIAGNOSIS — Z794 Long term (current) use of insulin: Secondary | ICD-10-CM | POA: Diagnosis not present

## 2020-12-22 DIAGNOSIS — E1165 Type 2 diabetes mellitus with hyperglycemia: Secondary | ICD-10-CM | POA: Diagnosis not present

## 2021-01-16 DIAGNOSIS — Z23 Encounter for immunization: Secondary | ICD-10-CM | POA: Diagnosis not present

## 2021-01-23 ENCOUNTER — Other Ambulatory Visit: Payer: Self-pay | Admitting: Family Medicine

## 2021-01-23 DIAGNOSIS — Z794 Long term (current) use of insulin: Secondary | ICD-10-CM

## 2021-01-23 DIAGNOSIS — E1165 Type 2 diabetes mellitus with hyperglycemia: Secondary | ICD-10-CM

## 2021-01-27 ENCOUNTER — Ambulatory Visit: Payer: Medicare Other | Admitting: Internal Medicine

## 2021-01-30 ENCOUNTER — Other Ambulatory Visit: Payer: Self-pay

## 2021-01-30 ENCOUNTER — Encounter: Payer: Self-pay | Admitting: Internal Medicine

## 2021-01-30 ENCOUNTER — Ambulatory Visit: Payer: Medicare Other | Admitting: Internal Medicine

## 2021-01-30 VITALS — BP 130/72 | HR 90 | Ht 64.0 in | Wt 240.2 lb

## 2021-01-30 DIAGNOSIS — Z794 Long term (current) use of insulin: Secondary | ICD-10-CM

## 2021-01-30 DIAGNOSIS — E1139 Type 2 diabetes mellitus with other diabetic ophthalmic complication: Secondary | ICD-10-CM

## 2021-01-30 DIAGNOSIS — E1165 Type 2 diabetes mellitus with hyperglycemia: Secondary | ICD-10-CM | POA: Diagnosis not present

## 2021-01-30 DIAGNOSIS — E1159 Type 2 diabetes mellitus with other circulatory complications: Secondary | ICD-10-CM

## 2021-01-30 LAB — POCT GLYCOSYLATED HEMOGLOBIN (HGB A1C): Hemoglobin A1C: 8 % — AB (ref 4.0–5.6)

## 2021-01-30 LAB — MICROALBUMIN / CREATININE URINE RATIO
Creatinine,U: 170 mg/dL
Microalb Creat Ratio: 1.4 mg/g (ref 0.0–30.0)
Microalb, Ur: 2.3 mg/dL — ABNORMAL HIGH (ref 0.0–1.9)

## 2021-01-30 NOTE — Patient Instructions (Signed)
Victoza 1.8 mg daily   Humalog per pump  Pump   MEDTRONIC   Settings   Insulin type   NOVOLOG    Basal rate       0000 2.05              I:C ratio       0000-0600  1:10                  Sensitivity       0000  20      Goal       0000  100- 110

## 2021-01-30 NOTE — Progress Notes (Signed)
Name: Lynn [name]  Age/ Sex: 65 y.o., female   MRN/ DOB: [id number], 1955-11-21     PCP: [name], MD   Reason for Endocrinology Evaluation: Type 2 Diabetes Mellitus  Initial Endocrine Consultative Visit: 03/31/2019    PATIENT IDENTIFIER: Ms. Setsuko Robins is a 65 y.o. female with a past medical history of T2DM, HTN, dyslipidemia,thalassemia, OSA on CPAP . The patient has followed with Endocrinology clinic since 03/31/2019 for consultative assistance with management of her diabetes.  DIABETIC HISTORY:  Ms. Tocci was diagnosed with T2DM in 2010, She has been on metformin in the past with reported prior intolerance due to diarrhea. Was on Glipizide at some point with no side effects.Has been on insulin for years. Her hemoglobin A1c has ranged from  7.7% in 2018, peaking at 10.4% in 2020.  On her initial presentation to our clinic, her A1c was 10.4% , she was on Lantus and Victoza    [name] was tried in 03/2019 but developed severe dizziness .     Injectable methotrexate was started November 2020 due to rheumatoid arthritis.  Patient is intolerant to oral methotrexate   Started on Omnipod 10/2020  SUBJECTIVE:   During the last visit (12/07/2020): A1c 8.5 % we continued  Lantus and  Victoza, restarted Humalog      Today (01/30/2021): Ms. Clauson is here for a month follow up on diabetes management. She has the Omnipod.   Has occasional nausea      She checks her blood sugars multiple times daily through CGM. The patient has not had hypoglycemic episodes since the last clinic visit.   This patient with type 2 diabetes is treated with Omnipod (insulin pump). During the visit the pump basal and bolus doses were reviewed including carb/insulin rations and supplemental doses. The clinical list was updated. The glucose meter download was reviewed in detail to determine if the current pump settings are providing the best glycemic control without excessive  hypoglycemia.  Pump and meter download:    Pump   Omnipod Settings   Insulin type   Humalog    Basal rate       0000  2.05 u/h               I:C ratio       0000 1:12                  Sensitivity       0000  20      Goal       0000  100-110             Type & Model of Pump: Omnipod Insulin Type: Currently using Humalog .  PUMP STATISTICS: Average BG: 222 BG Readings:86 Average Daily Carbs (g): 86 Average Total Daily Insulin: 66.9  Average Daily Basal: 47.1 (70 %) Average Daily Bolus: 19.8 (30%)       HOME DIABETES REGIMEN:  Victoza 1.8 mg daily  Humalog     CONTINUOUS GLUCOSE MONITORING RECORD INTERPRETATION    Dates of Recording: 4/5-4/18/2022  Sensor description:Dexcom  Results statistics:   CGM use % of time 100  Average and SD 202/58  Time in range   39%  % Time Above 180 42  % Time above 250 19  % Time Below target 0    Glycemic patterns summary: Hyperglycemia post prandial , optimal at night  Hyperglycemic episodes  Post prandial  Hypoglycemic episodes occurred rarely   Overnight periods: at goal  HISTORY:  Past Medical History:  Past Medical History:  Diagnosis Date  . Anginal pain ([place])   . Anxiety   . Arthritis    "knees, wrists, back, elbows" (07/03/2017)  . Clotting disorder ([place])    blood clot in eye 2016  . Coronary artery disease, non-occlusive    a. 11/2016 NSTEMI/Cath: LM nl, LAD 40p, 58md, D1/2 small, LCX 40ost, OM2/3 nl/small, RCA 35 ost/mid, RPDA/RPL small/nl.  . Depression   . Diastolic dysfunction    a. 11/2016 Echo: EF 55-60%, gr1 DD, sev calcified MV annulus, mildly dil LA.  Marland Kitchen Eye hemorrhage 01/2015   "right; resolved" (07/03/2017)  . Headache    "monthly" (07/03/2017)  . Heart murmur   . History of blood transfusion    "when I had an ectopic pregnancy"  . History of stomach ulcers   . Hyperlipidemia   . Hypertension   . Microcytic anemia   . OSA on CPAP    setting is unknown  . Pneumonia     "several times" (07/03/2017)  . Syncope 07/03/2017 X 2   called seizures but no medications  . Thalassemia    "my cells are sickle cell shaped but I don't have sickle cell anemia" (07/03/2017)  . Type II diabetes mellitus ([place])    Past Surgical History:  Past Surgical History:  Procedure Laterality Date  . 48-Hour Monitor  03/18/2017   Sinus rhythm with sinus tachycardia (rate 58-134 BPM)multiple PVCs noted with couplets and bigeminy. One triplet. 7 runs of PAT ranging from 100-130 bpm. Longest was 33 beats.  Marland Kitchen BREAST BIOPSY Left   . CARDIAC CATHETERIZATION N/A 11/19/2016   Procedure: Left Heart Cath and Coronary Angiography;  Surgeon: Belva Crome, MD;  Location: [facility];  Service: Cardiovascular.    LM nl, LAD 40p, 56md, D1/2 small, LCX 40ost, OM2/3 nl/small, RCA 35 ost/mid, RPDA/RPL small/nl.  . CARPAL TUNNEL RELEASE Right 11/01/2014  . COLONOSCOPY    . DILATION AND CURETTAGE OF UTERUS    . ECTOPIC PREGNANCY SURGERY  X 2  . JOINT REPLACEMENT    . KNEE ARTHROSCOPY Left 11/2014  . TONSILLECTOMY    . TOTAL KNEE ARTHROPLASTY Right 02/18/2015   Procedure: RIGHT TOTAL KNEE ARTHROPLASTY;  Surgeon: Netta Cedars, MD;  Location: [facility];  Service: Orthopedics;  Laterality: Right;  . TOTAL KNEE ARTHROPLASTY Left 08/19/2015   Procedure: LEFT TOTAL KNEE ARTHROPLASTY;  Surgeon: Netta Cedars, MD;  Location: [facility];  Service: Orthopedics;  Laterality: Left;  . TRANSTHORACIC ECHOCARDIOGRAM  11/2016; 07/2017:   a) normal LV size, thickness and function. EF 55-60%. GR 1 DD.No RWMA severe mitral annular calcification, but no mitral stenosis. Mild LA dilation;; b) normal LV size and function.  EF 65-75%.  Unable to assess diastolic function.  Aortic sclerosis with no stenosis.  Moderate mitral stenosis? (Gradient 9 mmHg) -?  Mild-mod left atrial enlargement   . TRANSTHORACIC ECHOCARDIOGRAM  01/2019   EF 65 to 70%.  Normal function.  Elevated LVEDP-GR 1 DD.  Normal RV size and function.  Large calcific  mass on posterior mitral leaflet mild to moderate mitral stenosis.  Marland Kitchen VAGINAL HYSTERECTOMY      Social History:  reports that she quit smoking about 5 years ago. Her smoking use included cigarettes. She has a 11.00 pack-year smoking history. She has never used smokeless tobacco. She reports current alcohol use. She reports that she does not use drugs. Family History:  Family History  Problem Relation Age of Onset  .  Cancer Mother   . Diabetes Mother   . Hypertension Mother   . Hyperlipidemia Mother   . Cancer Father   . Hyperlipidemia Father   . Mental illness Sister   . Diabetes Sister   . Diabetes Maternal Grandmother   . Diabetes Maternal Grandfather   . Colon cancer Neg Hx   . Esophageal cancer Neg Hx   . Stomach cancer Neg Hx   . Rectal cancer Neg Hx      HOME MEDICATIONS: Allergies as of 01/30/2021      Reactions   Penicillins Hives   Childhood allergy Has patient had a PCN reaction causing immediate rash, facial/tongue/throat swelling, SOB or lightheadedness with hypotension: Yes Has patient had a PCN reaction causing severe rash involving mucus membranes or skin necrosis: No Has patient had a PCN reaction that required hospitalization No Has patient had a PCN reaction occurring within the last 10 years: No If all of the above answers are "NO", then may proceed with Cephalosporin use.   Farxiga [dapagliflozin] Other (See Comments)   Dizzy and lethargic   Metformin And Related    Diarrhea, bleeding      Medication List       Accurate as of January 30, 2021  3:38 PM. If you have any questions, ask your nurse or doctor.        atorvastatin 80 MG tablet Commonly known as: LIPITOR TAKE 1 TABLET(80 MG) BY MOUTH DAILY AT 6 PM   B-D UF III MINI PEN NEEDLES 31G X 5 MM Misc Generic drug: Insulin Pen Needle USE DAILY WITH VICTOZA AND LANTUS.   Insulin Pen Needle 31G X 5 MM Misc 1 Device by Does not apply route 3 (three) times daily.   cetirizine 10 MG  tablet Commonly known as: ZYRTEC Take 10 mg by mouth daily as needed for allergies.   Contour Next Test test strip Generic drug: glucose blood 3x daily   Dexcom G6 Sensor Misc 1 Device by Does not apply route as directed.   Dexcom G6 Transmitter Misc 1 Device by Does not apply route as directed.   diltiazem 240 MG 24 hr capsule Commonly known as: CARDIZEM CD Take 1 capsule (240 mg total) by mouth daily.   folic acid 1 MG tablet Commonly known as: FOLVITE Take 2 tablets (2 mg total) by mouth daily.   hydrochlorothiazide 12.5 MG tablet Commonly known as: HYDRODIURIL Take one by mouth daily   insulin lispro 100 UNIT/ML injection Commonly known as: HumaLOG Max daily 100 units per pump   isosorbide mononitrate 30 MG 24 hr tablet Commonly known as: IMDUR Take 30 mg  tablet as needed for Chest pain spasm episodes for 3 days and the stop   Lancets 30G Misc 1 Device by Does not apply route 3 (three) times daily.   losartan 100 MG tablet Commonly known as: COZAAR Take 1 tablet (100 mg total) by mouth daily.   methotrexate 50 MG/2ML injection   multivitamin with minerals Tabs tablet Take 1 tablet by mouth daily. Reported on 01/03/2016   olopatadine 0.1 % ophthalmic solution Commonly known as: PATANOL prn   PRESCRIPTION MEDICATION Inhale into the lungs at bedtime. CPAP   SAFETY-LOK TB SYRINGE 27GX.5" 27G X 1/2" 1 ML Misc Generic drug: TUBERCULIN SYR 1CC/27GX1/2"   Victoza 18 MG/3ML Sopn Generic drug: liraglutide Inject 1.8 mg into the skin daily.        OBJECTIVE:   Vital Signs: BP 130/72   Pulse 90   Ht  5\' 4"  (1.626 m)   Wt 240 lb 4 oz (109 kg)   LMP  (LMP Unknown)   SpO2 98%   BMI 41.24 kg/m    Wt Readings from Last 3 Encounters:  01/30/21 240 lb 4 oz (109 kg)  12/07/20 239 lb 8 oz (108.6 kg)  09/23/20 246 lb (111.6 kg)     Exam: General: Pt appears well and is in NAD  Lungs: Clear with good BS bilat with no rales, rhonchi, or wheezes  Heart:  RRR  Extremities: No pretibial edema  Neuro: MS is good with appropriate affect, pt is alert and Ox3       DM foot exam: 01/30/2021 The skin of the feet is intact without sores or ulcerations. The pedal pulses are 2+ on right and 2+ on left. The sensation is intact to a screening 5.07, 10 gram monofilament bilaterally    DATA REVIEWED:  Lab Results  Component Value Date   HGBA1C 8.0 (A) 01/30/2021   HGBA1C 8.5 (A) 09/23/2020   HGBA1C 8.6 (A) 06/17/2020   Lab Results  Component Value Date   MICROALBUR <0.7 03/23/2019   LDLCALC 44 08/22/2020   CREATININE 1.2 (A) 08/25/2020   Lab Results  Component Value Date   MICRALBCREAT 1.4 03/23/2019     Lab Results  Component Value Date   CHOL 97 08/22/2020   HDL 38.80 (L) 08/22/2020   LDLCALC 44 08/22/2020   LDLDIRECT 131 (H) 09/02/2013   TRIG 74.0 08/22/2020   CHOLHDL 3 08/22/2020       Results for [name] "PAT" (MRN [id number]) as of 01/31/2021 11:34  Ref. Range 01/30/2021 15:46  Creatinine,U Latest Units: mg/dL 170.0  Microalb, Ur Latest Ref Range: 0.0 - 1.9 mg/dL 2.3 (H)  MICROALB/CREAT RATIO Latest Ref Range: 0.0 - 30.0 mg/g 1.4    ASSESSMENT / PLAN / RECOMMENDATIONS:   1) Type 2 Diabetes Mellitus, Poorly controlled, With retinopathy and macrovascular complications - Most recent A1c of 8.0 %. Goal A1c <7.0 %.     - A1c down from 8.5 %  - She is not bolusing as much during the day and suspect she is missing CHO entries, she is heavy on the basal rate at this time, but I am going to adjust her I: C ratio as she has been noted with hyperglycemia with few times that she has bolused for CHO intake      MEDICATIONS: Continue Victoza 1.8 mg daily Humalog per pump  Pump    Omni pod Settings   Insulin type    Humalog   Basal rate       0000 2.05              I:C ratio       0000-0600  1:10                   Sensitivity       0000  20      Goal       0000  100- 110         EDUCATION /  INSTRUCTIONS:  BG monitoring instructions: Patient is instructed to check her blood sugars 3 times a day, fasting , supper and bedtime.  Call [facility] if: BG persistently < 70  . I reviewed the Rule of 15 for the treatment of hypoglycemia in detail with the patient. Literature supplied.   F/U in 4 months    Signed electronically by: Mack Guise, MD  [facility]  [facility] [address] [place], [place] [address] Phone: [phone] FAX: [phone]   CC: [name], [address] [place] [address] Phone: [phone]  Fax: [phone]  Return to Endocrinology clinic as below: Future Appointments  Date Time Provider [place]  02/20/2021  2:00 PM Copland, Gay Filler, MD [id number]

## 2021-02-18 NOTE — Progress Notes (Signed)
[facility] [address], [place], [place] [address] [phone]  Date:  02/20/2021   Name:  Lynn [name]   DOB:  1956-05-08   MRN:  [id number]  PCP:  Lynn Mclean, MD    Chief Complaint: Diabetes, Hypertension (6 month follow up/), and insominia (Trouble sleeping, taking melatonin otc, helping some)   History of Present Illness:  Lynn [name] is a 66 y.o. very pleasant female patient who presents with the following:  Here today for a periodic follow-up visit Last seen by myself in November- History of diabetes, hypertension, CAD/MI, COPD, sleep apnea, dyslipidemia, rheumatoid arthritis, thalassemia She is also seeing endocrinology- Dr Kelton Pillar- last visit in April  Her A1c was improved to 8%.  She is using a continuous glucose monitor  [facility] rheumatology - Lynn [name]  Methotrexate injection once a week  She has noted some insomnia issues- melatonin helps most of the time but not always She has noted insomnia for a year or so now She has difficulty getting to sleep- she may feel some anxiety  Her sister died 3 weeks ago at 78 yo - she was out of town taking care of her and forgot to take some of her medications while she was out of town  For the time being she prefers to stick with melatonin but will let me know if things are getting worse at all   Foot exam is due  covid 4th dose- done late March 2022 shingrix -we discussed today She has floaters and some blurred vision-they are not new, she has regular eye care visits.  I encouraged her to see her eye doctor soon for recheck  Patient Active Problem List   Diagnosis Date Noted  . Coronary artery spasm ([place]) 08/09/2020  . Rheumatoid arthritis ([place]) 11/09/2019  . Toe pain, bilateral 05/26/2019  . Type 2 diabetes mellitus with hyperglycemia, with long-term current use of insulin ([place]) 05/26/2019  . Mobility impaired 04/01/2019   . Morbid obesity due to excess calories ([place]) 12/03/2017  . Dyspnea on exertion 12/02/2017  . Mitral stenosis 08/23/2017  . Diastolic heart failure ([place]) 08/12/2017  . Acute respiratory failure with hypoxia ([place]) 08/12/2017  . OSA on CPAP 08/12/2017  . COPD with acute bronchitis ([place]) 08/12/2017  . Near syncope 07/03/2017  . Anxiety 07/03/2017  . Coronary artery disease, non-occlusive 12/08/2016  . PAT (paroxysmal atrial tachycardia) ([place])   . Demand myocardial infarction ([place]) 11/16/2016  . H/O total knee replacement 08/19/2015  . Vision, loss, sudden 03/07/2015  . S/P TKR (total knee replacement) using cement 02/18/2015  . Thalassemia 09/02/2013  . Hyperlipidemia with target low density lipoprotein (LDL) cholesterol less than 100 mg/dL 08/24/2012  . Essential hypertension 07/22/2012    Past Medical History:  Diagnosis Date  . Anginal pain ([place])   . Anxiety   . Arthritis    "knees, wrists, back, elbows" (07/03/2017)  . Clotting disorder ([place])    blood clot in eye 2016  . Coronary artery disease, non-occlusive    a. 11/2016 NSTEMI/Cath: LM nl, LAD 40p, 58md, D1/2 small, LCX 40ost, OM2/3 nl/small, RCA 35 ost/mid, RPDA/RPL small/nl.  . Depression   . Diastolic dysfunction    a. 11/2016 Echo: EF 55-60%, gr1 DD, sev calcified MV annulus, mildly dil LA.  Marland Kitchen Eye hemorrhage 01/2015   "right; resolved" (07/03/2017)  . Headache    "monthly" (07/03/2017)  . Heart murmur   . History of  blood transfusion    "when I had an ectopic pregnancy"  . History of stomach ulcers   . Hyperlipidemia   . Hypertension   . Microcytic anemia   . OSA on CPAP    setting is unknown  . Pneumonia    "several times" (07/03/2017)  . Syncope 07/03/2017 X 2   called seizures but no medications  . Thalassemia    "my cells are sickle cell shaped but I don't have sickle cell anemia" (07/03/2017)  . Type II diabetes mellitus ([facility])     Past Surgical History:  Procedure Laterality Date  . 48-Hour Monitor   03/18/2017   Sinus rhythm with sinus tachycardia (rate 58-134 BPM)multiple PVCs noted with couplets and bigeminy. One triplet. 7 runs of PAT ranging from 100-130 bpm. Longest was 33 beats.  Marland Kitchen BREAST BIOPSY Left   . CARDIAC CATHETERIZATION N/A 11/19/2016   Procedure: Left Heart Cath and Coronary Angiography;  Surgeon: Lyn Records, MD;  Location: [facility];  Service: Cardiovascular.    LM nl, LAD 40p, 33md, D1/2 small, LCX 40ost, OM2/3 nl/small, RCA 35 ost/mid, RPDA/RPL small/nl.  . CARPAL TUNNEL RELEASE Right 11/01/2014  . COLONOSCOPY    . DILATION AND CURETTAGE OF UTERUS    . ECTOPIC PREGNANCY SURGERY  X 2  . JOINT REPLACEMENT    . KNEE ARTHROSCOPY Left 11/2014  . TONSILLECTOMY    . TOTAL KNEE ARTHROPLASTY Right 02/18/2015   Procedure: RIGHT TOTAL KNEE ARTHROPLASTY;  Surgeon: Beverely Low, MD;  Location: [facility];  Service: Orthopedics;  Laterality: Right;  . TOTAL KNEE ARTHROPLASTY Left 08/19/2015   Procedure: LEFT TOTAL KNEE ARTHROPLASTY;  Surgeon: Beverely Low, MD;  Location: [facility];  Service: Orthopedics;  Laterality: Left;  . TRANSTHORACIC ECHOCARDIOGRAM  11/2016; 07/2017:   a) normal LV size, thickness and function. EF 55-60%. GR 1 DD.No RWMA severe mitral annular calcification, but no mitral stenosis. Mild LA dilation;; b) normal LV size and function.  EF 65-75%.  Unable to assess diastolic function.  Aortic sclerosis with no stenosis.  Moderate mitral stenosis? (Gradient 9 mmHg) -?  Mild-mod left atrial enlargement   . TRANSTHORACIC ECHOCARDIOGRAM  01/2019   EF 65 to 70%.  Normal function.  Elevated LVEDP-GR 1 DD.  Normal RV size and function.  Large calcific mass on posterior mitral leaflet mild to moderate mitral stenosis.  Marland Kitchen VAGINAL HYSTERECTOMY      Social History   Tobacco Use  . Smoking status: Former Smoker    Packs/day: 0.25    Years: 44.00    Pack years: 11.00    Types: Cigarettes    Quit date: 02/17/2015    Years since quitting: 6.0  . Smokeless tobacco: Never Used   Vaping Use  . Vaping Use: Never used  Substance Use Topics  . Alcohol use: Yes    Alcohol/week: 0.0 standard drinks    Comment: 07/03/2017 "might have a few drinks/year"  . Drug use: No    Family History  Problem Relation Age of Onset  . Cancer Mother   . Diabetes Mother   . Hypertension Mother   . Hyperlipidemia Mother   . Cancer Father   . Hyperlipidemia Father   . Mental illness Sister   . Diabetes Sister   . Diabetes Maternal Grandmother   . Diabetes Maternal Grandfather   . Colon cancer Neg Hx   . Esophageal cancer Neg Hx   . Stomach cancer Neg Hx   . Rectal cancer Neg Hx  Allergies  Allergen Reactions  . Penicillins Hives    Childhood allergy Has patient had a PCN reaction causing immediate rash, facial/tongue/throat swelling, SOB or lightheadedness with hypotension: Yes Has patient had a PCN reaction causing severe rash involving mucus membranes or skin necrosis: No Has patient had a PCN reaction that required hospitalization No Has patient had a PCN reaction occurring within the last 10 years: No If all of the above answers are "NO", then may proceed with Cephalosporin use.  [name] [Dapagliflozin] Other (See Comments)    Dizzy and lethargic   . Metformin And Related     Diarrhea, bleeding    Medication list has been reviewed and updated.  Current Outpatient Medications on File Prior to Visit  Medication Sig Dispense Refill  . cetirizine (ZYRTEC) 10 MG tablet Take 10 mg by mouth daily as needed for allergies.    . Continuous Blood Gluc Sensor (DEXCOM G6 SENSOR) MISC 1 Device by Does not apply route as directed. 3 each 6  . Continuous Blood Gluc Transmit (DEXCOM G6 TRANSMITTER) MISC 1 Device by Does not apply route as directed. 1 each 3  . folic acid (FOLVITE) 1 MG tablet Take 2 tablets (2 mg total) by mouth daily. 180 tablet 3  . glucose blood (CONTOUR NEXT TEST) test strip 3x daily 300 each 11  . hydrochlorothiazide (HYDRODIURIL) 12.5 MG tablet Take  one by mouth daily 90 tablet 3  . Insulin Pen Needle (B-D UF III MINI PEN NEEDLES) 31G X 5 MM MISC USE DAILY WITH VICTOZA AND LANTUS. 400 each 1  . Insulin Pen Needle 31G X 5 MM MISC 1 Device by Does not apply route 3 (three) times daily. 150 each 11  . Lancets 30G MISC 1 Device by Does not apply route 3 (three) times daily. 300 each 9  . methotrexate 50 MG/2ML injection     . Multiple Vitamin (MULTIVITAMIN WITH MINERALS) TABS tablet Take 1 tablet by mouth daily. Reported on 01/03/2016    . SAFETY-LOK TB SYRINGE 27GX.5" 27G X 1/2" 1 ML MISC     . VICTOZA 18 MG/3ML SOPN INJECT 1.8 MG UNDER THE SKIN ONCE DAILY 15 mL 3  . diltiazem (CARDIZEM CD) 240 MG 24 hr capsule Take 1 capsule (240 mg total) by mouth daily. 90 capsule 3  . Insulin Disposable Pump (OMNIPOD DASH PODS, GEN 4,) MISC     . insulin lispro (HUMALOG) 100 UNIT/ML injection Inject into the skin.    Marland Kitchen isosorbide mononitrate (IMDUR) 30 MG 24 hr tablet Take 30 mg  tablet as needed for Chest pain spasm episodes for 3 days and the stop (Patient not taking: Reported on 02/20/2021) 20 tablet 7  . losartan (COZAAR) 100 MG tablet Take 1 tablet (100 mg total) by mouth daily. 90 tablet 3   No current facility-administered medications on file prior to visit.    Review of Systems:  As per HPI- otherwise negative.   Physical Examination: Vitals:   02/20/21 1337  BP: 124/78  Pulse: 69  Resp: 18  Temp: 97.7 F (36.5 C)  SpO2: 98%   Vitals:   02/20/21 1337  Weight: 236 lb (107 kg)  Height: 5\' 4"  (1.626 m)   Body mass index is 40.51 kg/m. Ideal Body Weight: Weight in (lb) to have BMI = 25: 145.3  GEN: no acute distress.  Obese, otherwise looks well HEENT: Atraumatic, Normocephalic.   Bilateral TM wnl, oropharynx normal.  PEERL,EOMI.   Ears and Nose: No external deformity. CV:  RRR, No M/G/R. No JVD. No thrill. No extra heart sounds. PULM: CTA B, no wheezes, crackles, rhonchi. No retractions. No resp. distress. No accessory muscle  use. ABD: S, NT, ND, +BS. No rebound. No HSM. EXTR: No c/c/e PSYCH: Normally interactive. Conversant.  Foot exam done today Assessment and Plan: Essential hypertension  Type 2 diabetes mellitus with hyperglycemia, with long-term current use of insulin ([facility])  Hyperlipidemia associated with type 2 diabetes mellitus ([place]) - Plan: atorvastatin (LIPITOR) 80 MG tablet  Rheumatoid arthritis, involving unspecified site, unspecified whether rheumatoid factor present ([place])  NSTEMI (non-ST elevated myocardial infarction) ([place]) - Plan: atorvastatin (LIPITOR) 80 MG tablet  Primary insomnia  Patient is here today for follow-up visit.  Her blood pressures under good control Diabetes is managed by endocrinology Rheumatoid arthritis is managed by rheumatology She notes some issues with insomnia.  She is currently taking melatonin.  We discussed trying a prescription medication such as trazodone.  For the time being she prefers to continue melatonin, will let me know if this changes Refilled Lipitor, cholesterol labs are up-to-date Encouraged shingles vaccination Follow-up here in 6 months  This visit occurred during the SARS-CoV-2 public health emergency.  Safety protocols were in place, including screening questions prior to the visit, additional usage of staff PPE, and extensive cleaning of exam room while observing appropriate contact time as indicated for disinfecting solutions.    Signed Lamar Blinks, MD

## 2021-02-19 ENCOUNTER — Other Ambulatory Visit: Payer: Self-pay | Admitting: Family Medicine

## 2021-02-19 DIAGNOSIS — E1165 Type 2 diabetes mellitus with hyperglycemia: Secondary | ICD-10-CM

## 2021-02-19 DIAGNOSIS — Z794 Long term (current) use of insulin: Secondary | ICD-10-CM

## 2021-02-20 ENCOUNTER — Other Ambulatory Visit (HOSPITAL_BASED_OUTPATIENT_CLINIC_OR_DEPARTMENT_OTHER): Payer: Self-pay

## 2021-02-20 ENCOUNTER — Encounter: Payer: Self-pay | Admitting: Family Medicine

## 2021-02-20 ENCOUNTER — Ambulatory Visit (INDEPENDENT_AMBULATORY_CARE_PROVIDER_SITE_OTHER): Payer: Medicare Other | Admitting: Family Medicine

## 2021-02-20 ENCOUNTER — Other Ambulatory Visit: Payer: Self-pay

## 2021-02-20 VITALS — BP 124/78 | HR 69 | Temp 97.7°F | Resp 18 | Ht 64.0 in | Wt 236.0 lb

## 2021-02-20 DIAGNOSIS — F5101 Primary insomnia: Secondary | ICD-10-CM | POA: Diagnosis not present

## 2021-02-20 DIAGNOSIS — E1169 Type 2 diabetes mellitus with other specified complication: Secondary | ICD-10-CM | POA: Diagnosis not present

## 2021-02-20 DIAGNOSIS — I214 Non-ST elevation (NSTEMI) myocardial infarction: Secondary | ICD-10-CM

## 2021-02-20 DIAGNOSIS — M069 Rheumatoid arthritis, unspecified: Secondary | ICD-10-CM

## 2021-02-20 DIAGNOSIS — Z794 Long term (current) use of insulin: Secondary | ICD-10-CM

## 2021-02-20 DIAGNOSIS — E785 Hyperlipidemia, unspecified: Secondary | ICD-10-CM | POA: Diagnosis not present

## 2021-02-20 DIAGNOSIS — Z23 Encounter for immunization: Secondary | ICD-10-CM

## 2021-02-20 DIAGNOSIS — E1165 Type 2 diabetes mellitus with hyperglycemia: Secondary | ICD-10-CM

## 2021-02-20 DIAGNOSIS — I10 Essential (primary) hypertension: Secondary | ICD-10-CM

## 2021-02-20 MED ORDER — ZOSTER VAC RECOMB ADJUVANTED 50 MCG/0.5ML IM SUSR
INTRAMUSCULAR | 1 refills | Status: DC
  Filled 2021-02-20: qty 1, 1d supply, fill #0

## 2021-02-20 MED ORDER — ATORVASTATIN CALCIUM 80 MG PO TABS: ORAL_TABLET | ORAL | 3 refills | Status: DC

## 2021-02-20 NOTE — Patient Instructions (Addendum)
It was good to see you again today- please stop by the pharmacy on the ground floor today and get your first dose of shingles vaccine   Assuming all is well please see me in about 6 months Let me know if you need anything else for insomnia - melatonin is fine to use however   Please see me in about 6 months- we can do your cholesterol level at that time

## 2021-02-20 NOTE — Telephone Encounter (Signed)
Okay to refill medication.  She was last seen by Endo on 01/30/21 an we refilled rx on 01/23/21.

## 2021-02-22 DIAGNOSIS — M0579 Rheumatoid arthritis with rheumatoid factor of multiple sites without organ or systems involvement: Secondary | ICD-10-CM | POA: Diagnosis not present

## 2021-02-22 DIAGNOSIS — R5382 Chronic fatigue, unspecified: Secondary | ICD-10-CM | POA: Diagnosis not present

## 2021-02-22 DIAGNOSIS — M2550 Pain in unspecified joint: Secondary | ICD-10-CM | POA: Diagnosis not present

## 2021-02-22 LAB — CBC AND DIFFERENTIAL
HCT: 33 — AB (ref 36–46)
Hemoglobin: 9.6 — AB (ref 12.0–16.0)
Platelets: 315 (ref 150–399)
WBC: 7.9

## 2021-02-22 LAB — BASIC METABOLIC PANEL
BUN: 16 (ref 4–21)
CO2: 25 — AB (ref 13–22)
Chloride: 99 (ref 99–108)
Creatinine: 1 (ref 0.5–1.1)
Glucose: 93
Potassium: 4.5 (ref 3.4–5.3)
Sodium: 139 (ref 137–147)

## 2021-02-22 LAB — HEPATIC FUNCTION PANEL
ALT: 61 — AB (ref 7–35)
AST: 29 (ref 13–35)
Alkaline Phosphatase: 109 (ref 25–125)

## 2021-02-22 LAB — COMPREHENSIVE METABOLIC PANEL: Calcium: 9.8 (ref 8.7–10.7)

## 2021-03-10 ENCOUNTER — Encounter: Payer: Self-pay | Admitting: Family Medicine

## 2021-03-15 ENCOUNTER — Encounter: Payer: Self-pay | Admitting: Family Medicine

## 2021-03-15 DIAGNOSIS — I050 Rheumatic mitral stenosis: Secondary | ICD-10-CM

## 2021-03-15 DIAGNOSIS — I480 Paroxysmal atrial fibrillation: Secondary | ICD-10-CM

## 2021-03-15 HISTORY — DX: Paroxysmal atrial fibrillation: I48.0

## 2021-03-15 HISTORY — DX: Rheumatic mitral stenosis: I05.0

## 2021-03-17 ENCOUNTER — Ambulatory Visit
Admission: EM | Admit: 2021-03-17 | Discharge: 2021-03-17 | Disposition: A | Discharge status: Home / Self Care | Payer: Medicare Other | Attending: Student | Admitting: Student

## 2021-03-17 ENCOUNTER — Other Ambulatory Visit: Payer: Self-pay

## 2021-03-17 ENCOUNTER — Ambulatory Visit (INDEPENDENT_AMBULATORY_CARE_PROVIDER_SITE_OTHER): Payer: Medicare Other

## 2021-03-17 DIAGNOSIS — J209 Acute bronchitis, unspecified: Secondary | ICD-10-CM | POA: Diagnosis not present

## 2021-03-17 DIAGNOSIS — E1169 Type 2 diabetes mellitus with other specified complication: Secondary | ICD-10-CM | POA: Diagnosis not present

## 2021-03-17 DIAGNOSIS — Z794 Long term (current) use of insulin: Secondary | ICD-10-CM | POA: Diagnosis not present

## 2021-03-17 DIAGNOSIS — R059 Cough, unspecified: Secondary | ICD-10-CM | POA: Diagnosis not present

## 2021-03-17 DIAGNOSIS — I5189 Other ill-defined heart diseases: Secondary | ICD-10-CM | POA: Diagnosis not present

## 2021-03-17 DIAGNOSIS — M069 Rheumatoid arthritis, unspecified: Secondary | ICD-10-CM

## 2021-03-17 DIAGNOSIS — R0602 Shortness of breath: Secondary | ICD-10-CM | POA: Diagnosis not present

## 2021-03-17 DIAGNOSIS — Z87891 Personal history of nicotine dependence: Secondary | ICD-10-CM

## 2021-03-17 DIAGNOSIS — J410 Simple chronic bronchitis: Secondary | ICD-10-CM

## 2021-03-17 MED ORDER — FLOVENT DISKUS 50 MCG/BLIST IN AEPB: 1.0000 | INHALATION_SPRAY | Freq: Two times a day (BID) | RESPIRATORY_TRACT | 0 refills | Status: DC

## 2021-03-17 NOTE — Progress Notes (Addendum)
[facility] [place], [place], [place] [address] [phone]  Date:  03/20/2021   Name:  Lynn [name]   DOB:  August 19, 1956   MRN:  [id number]  PCP:  [name], MD    Chief Complaint: Weakness (No energy, weakness, nausea and vomiting/), Shortness of Breath (Shortness of breath), and Fever (Low grade consistent temp, cough non productive, sore throat, headache, body aches, negative covid tests)   History of Present Illness:  Lynn [name] is a 65 y.o. very pleasant female patient who presents with the following:  Visit today to discuss recent illness symptoms Our last in person visit was about a month ago-routine visit, at that time she was doing okay She then message me with the following concerns and elected to be seen today  This has been going on for the pass two week.  I have been feeling so week, drop off to sleep ever change I get, feel like I have to throw up, but never do. Don't have any energy. I got tested, negative. Just don't understand what's going on  History of diabetes, hypertension, CAD/MI, COPD, sleep apnea, dyslipidemia, rheumatoid arthritis, thalassemia She is also seeing endocrinology- Dr Kelton Pillar- last visit in April  [facility] - Leafy Kindle  Methotrexate injection once a week  Most recent visit with cardiology was in October Essential hypertension (Chronic)     High blood pressure today.  We have room to increase pulse losartan to 100 mg daily and diltiazem up to 240 mg daily.      Relevant Medications   diltiazem (CARDIZEM CD) 240 MG 24 hr capsule   losartan (COZAAR) 100 MG tablet   isosorbide mononitrate (IMDUR) 30 MG 24 hr tablet   Coronary artery disease, non-occlusive - Primary (Chronic)    Nonocclusive CAD by ischemic evaluation, however, exclude microvascular disease or spasm\.   To treat microvascular disease, will increase  diltiazem dose to 240 mg daily which will also help rate control.    Also increasing afterload reduction by increasing losartan to 100 daily.  Continue atorvastatin.    Also potentially consider aspirin.      Relevant Medications   diltiazem (CARDIZEM CD) 240 MG 24 hr capsule   losartan (COZAAR) 100 MG tablet   isosorbide mononitrate (IMDUR) 30 MG 24 hr tablet   Other Relevant Orders   EKG 12-Lead (Completed)   PAT (paroxysmal atrial tachycardia) (HCC) (Chronic)    Tachycardia spells are notably improved with diltiazem.  I am increasing dose for other effects, but this will also help palpitations.      Relevant Medications   diltiazem (CARDIZEM CD) 240 MG 24 hr capsule   losartan (COZAAR) 100 MG tablet   isosorbide mononitrate (IMDUR) 30 MG 24 hr tablet   Other Relevant Orders   EKG 12-Lead (Completed)   Coronary artery spasm (HCC) (Chronic)    Intermittent chest comfort spells, may be related to spasm, not sure.    Will increase diltiazem to 240 mg, and add as needed Imdur 30 mg for 3 days.      Relevant Medications   diltiazem (CARDIZEM CD) 240 MG 24 hr capsule   losartan (COZAAR) 100 MG tablet   isosorbide mononitrate (IMDUR) 30 MG 24 hr tablet   Other Relevant Orders   EKG 12-Lead (Completed)   Hyperlipidemia with target low density lipoprotein (LDL) cholesterol less than 100 mg/dL (Chronic)    She is now on  atorvastatin stable dose.  Most recent labs were from June well-controlled.  Due to have labs checked soon.      Relevant Medications   diltiazem (CARDIZEM CD) 240 MG 24 hr capsule   losartan (COZAAR) 100 MG tablet   isosorbide mononitrate (IMDUR) 30 MG 24 hr tablet   Diastolic heart failure (HCC) (Chronic)    Euvolemic on exam.  Is on HCTZ plus losartan which I will increase 200 mg daily for additional reduction. Class I-II symptoms.  More dyspnea related to obesity as opposed to      Relevant Medications    diltiazem (CARDIZEM CD) 240 MG 24 hr capsule   losartan (COZAAR) 100 MG tablet   isosorbide mononitrate (IMDUR) 30 MG 24 hr tablet   Mitral stenosis (Chronic)    Mild to moderate stenosis noted.  No significant change in symptoms.  Follow-up echo after 2-3 years.      Relevant Medications     She has been ill for 2.5 weeks with body aches, dizziness, SOB, feeling nauseated but no vomiting Cough but this is improving  She has noted fevers up to T-max 101.8- yesterday 99 No diarrhea  She tested for covid 3x- she did rapid tests at home and negative each time   She actually went to [facility] on Friday and they did an EKG and chest x-ray  IMPRESSION: Interstitial thickening, likely indicative of a degree of underlying chronic bronchitis. No edema or airspace opacity. Heart upper normal in size. Stable calcification in the region of the mitral valve/mitral annulus. Aortic Atherosclerosis (ICD10-I70.0).  Lab Results  Component Value Date   HGBA1C 8.0 (A) 01/30/2021    She was given flovent inhaler but no abx   She notes intermittent sharp chest pains, but this is not new-patient notes this is going on for a few years, we think this is due to coronary spasm-treated with diltiazem per cardiology.  No recent change  Wt Readings from Last 3 Encounters:  03/20/21 233 lb (105.7 kg)  02/20/21 236 lb (107 kg)  01/30/21 240 lb 4 oz (109 kg)     Patient Active Problem List   Diagnosis Date Noted  . Coronary artery spasm ([place]) 08/09/2020  . Rheumatoid arthritis ([place]) 11/09/2019  . Toe pain, bilateral 05/26/2019  . Type 2 diabetes mellitus with hyperglycemia, with long-term current use of insulin ([place]) 05/26/2019  . Mobility impaired 04/01/2019  . Morbid obesity due to excess calories ([place]) 12/03/2017  . Dyspnea on exertion 12/02/2017  . Mitral stenosis 08/23/2017  . Diastolic heart failure ([place]) 08/12/2017  . Acute respiratory failure with hypoxia ([place]) 08/12/2017  . OSA on  CPAP 08/12/2017  . COPD with acute bronchitis ([place]) 08/12/2017  . Near syncope 07/03/2017  . Anxiety 07/03/2017  . Coronary artery disease, non-occlusive 12/08/2016  . PAT (paroxysmal atrial tachycardia) ([place])   . Demand myocardial infarction ([place]) 11/16/2016  . H/O total knee replacement 08/19/2015  . Vision, loss, sudden 03/07/2015  . S/P TKR (total knee replacement) using cement 02/18/2015  . Thalassemia 09/02/2013  . Hyperlipidemia with target low density lipoprotein (LDL) cholesterol less than 100 mg/dL 08/24/2012  . Essential hypertension 07/22/2012    Past Medical History:  Diagnosis Date  . Anginal pain ([place])   . Anxiety   . Arthritis    "knees, wrists, back, elbows" (07/03/2017)  . Clotting disorder ([place])    blood clot in eye 2016  . Coronary artery disease, non-occlusive    a. 11/2016 NSTEMI/Cath: LM nl, LAD 40p,  33md, D1/2 small, LCX 40ost, OM2/3 nl/small, RCA 35 ost/mid, RPDA/RPL small/nl.  . Depression   . Diastolic dysfunction    a. 11/2016 Echo: EF 55-60%, gr1 DD, sev calcified MV annulus, mildly dil LA.  [facility] Kitchen Eye hemorrhage 01/2015   "right; resolved" (07/03/2017)  . Headache    "monthly" (07/03/2017)  . Heart murmur   . History of blood transfusion    "when I had an ectopic pregnancy"  . History of stomach ulcers   . Hyperlipidemia   . Hypertension   . Microcytic anemia   . OSA on CPAP    setting is unknown  . Pneumonia    "several times" (07/03/2017)  . Syncope 07/03/2017 X 2   called seizures but no medications  . Thalassemia    "my cells are sickle cell shaped but I don't have sickle cell anemia" (07/03/2017)  . Type II diabetes mellitus ([place])     Past Surgical History:  Procedure Laterality Date  . 48-Hour Monitor  03/18/2017   Sinus rhythm with sinus tachycardia (rate 58-134 BPM)multiple PVCs noted with couplets and bigeminy. One triplet. 7 runs of PAT ranging from 100-130 bpm. Longest was 33 beats.  Marland Kitchen BREAST BIOPSY Left   . CARDIAC CATHETERIZATION  N/A 11/19/2016   Procedure: Left Heart Cath and Coronary Angiography;  Surgeon: Belva Crome, MD;  Location: [facility];  Service: Cardiovascular.    LM nl, LAD 40p, 47md, D1/2 small, LCX 40ost, OM2/3 nl/small, RCA 35 ost/mid, RPDA/RPL small/nl.  . CARPAL TUNNEL RELEASE Right 11/01/2014  . COLONOSCOPY    . DILATION AND CURETTAGE OF UTERUS    . ECTOPIC PREGNANCY SURGERY  X 2  . JOINT REPLACEMENT    . KNEE ARTHROSCOPY Left 11/2014  . TONSILLECTOMY    . TOTAL KNEE ARTHROPLASTY Right 02/18/2015   Procedure: RIGHT TOTAL KNEE ARTHROPLASTY;  Surgeon: Netta Cedars, MD;  Location: [facility];  Service: Orthopedics;  Laterality: Right;  . TOTAL KNEE ARTHROPLASTY Left 08/19/2015   Procedure: LEFT TOTAL KNEE ARTHROPLASTY;  Surgeon: Netta Cedars, MD;  Location: [facility];  Service: Orthopedics;  Laterality: Left;  . TRANSTHORACIC ECHOCARDIOGRAM  11/2016; 07/2017:   a) normal LV size, thickness and function. EF 55-60%. GR 1 DD.No RWMA severe mitral annular calcification, but no mitral stenosis. Mild LA dilation;; b) normal LV size and function.  EF 65-75%.  Unable to assess diastolic function.  Aortic sclerosis with no stenosis.  Moderate mitral stenosis? (Gradient 9 mmHg) -?  Mild-mod left atrial enlargement   . TRANSTHORACIC ECHOCARDIOGRAM  01/2019   EF 65 to 70%.  Normal function.  Elevated LVEDP-GR 1 DD.  Normal RV size and function.  Large calcific mass on posterior mitral leaflet mild to moderate mitral stenosis.  Marland Kitchen VAGINAL HYSTERECTOMY      Social History   Tobacco Use  . Smoking status: Former Smoker    Packs/day: 0.25    Years: 44.00    Pack years: 11.00    Types: Cigarettes    Quit date: 02/17/2015    Years since quitting: 6.0  . Smokeless tobacco: Never Used  Vaping Use  . Vaping Use: Never used  Substance Use Topics  . Alcohol use: Yes    Alcohol/week: 0.0 standard drinks    Comment: 07/03/2017 "might have a few drinks/year"  . Drug use: No    Family History  Problem Relation Age of  Onset  . Cancer Mother   . Diabetes Mother   . Hypertension Mother   . Hyperlipidemia  Mother   . Cancer Father   . Hyperlipidemia Father   . Mental illness Sister   . Diabetes Sister   . Diabetes Maternal Grandmother   . Diabetes Maternal Grandfather   . Colon cancer Neg Hx   . Esophageal cancer Neg Hx   . Stomach cancer Neg Hx   . Rectal cancer Neg Hx     Allergies  Allergen Reactions  . Penicillins Hives    Childhood allergy Has patient had a PCN reaction causing immediate rash, facial/tongue/throat swelling, SOB or lightheadedness with hypotension: Yes Has patient had a PCN reaction causing severe rash involving mucus membranes or skin necrosis: No Has patient had a PCN reaction that required hospitalization No Has patient had a PCN reaction occurring within the last 10 years: No If all of the above answers are "NO", then may proceed with Cephalosporin use.  . Metformin And Related     Diarrhea, bleeding    Medication list has been reviewed and updated.  Current Outpatient Medications on File Prior to Visit  Medication Sig Dispense Refill  . atorvastatin (LIPITOR) 80 MG tablet TAKE 1 TABLET(80 MG) BY MOUTH DAILY AT 6 PM 90 tablet 3  . cetirizine (ZYRTEC) 10 MG tablet Take 10 mg by mouth daily as needed for allergies.    . Continuous Blood Gluc Sensor (DEXCOM G6 SENSOR) MISC 1 Device by Does not apply route as directed. 3 each 6  . Continuous Blood Gluc Transmit (DEXCOM G6 TRANSMITTER) MISC 1 Device by Does not apply route as directed. 1 each 3  . fluticasone (FLOVENT DISKUS) 50 MCG/BLIST diskus inhaler Inhale 1 puff into the lungs 2 (two) times daily. 1 each 0  . folic acid (FOLVITE) 1 MG tablet Take 2 tablets (2 mg total) by mouth daily. 180 tablet 3  . glucose blood (CONTOUR NEXT TEST) test strip 3x daily 300 each 11  . hydrochlorothiazide (HYDRODIURIL) 12.5 MG tablet Take one by mouth daily 90 tablet 3  . Insulin Disposable Pump (OMNIPOD DASH PODS, GEN 4,) MISC     .  insulin lispro (HUMALOG) 100 UNIT/ML injection Inject into the skin.    . Insulin Pen Needle (B-D UF III MINI PEN NEEDLES) 31G X 5 MM MISC USE DAILY WITH VICTOZA AND LANTUS. 400 each 1  . Insulin Pen Needle 31G X 5 MM MISC 1 Device by Does not apply route 3 (three) times daily. 150 each 11  . isosorbide mononitrate (IMDUR) 30 MG 24 hr tablet Take 30 mg  tablet as needed for Chest pain spasm episodes for 3 days and the stop (Patient taking differently: Take 30 mg  tablet as needed for Chest pain spasm episodes for 3 days and the stop) 20 tablet 7  . Lancets 30G MISC 1 Device by Does not apply route 3 (three) times daily. 300 each 9  . methotrexate 50 MG/2ML injection     . Multiple Vitamin (MULTIVITAMIN WITH MINERALS) TABS tablet Take 1 tablet by mouth daily. Reported on 01/03/2016    . SAFETY-LOK TB SYRINGE 27GX.5" 27G X 1/2" 1 ML MISC     . VICTOZA 18 MG/3ML SOPN INJECT 1.8 MG UNDER THE SKIN ONCE DAILY 15 mL 3  . diltiazem (CARDIZEM CD) 240 MG 24 hr capsule Take 1 capsule (240 mg total) by mouth daily. 90 capsule 3  . losartan (COZAAR) 100 MG tablet Take 1 tablet (100 mg total) by mouth daily. 90 tablet 3   No current facility-administered medications on file prior to visit.  Review of Systems:  As per HPI- otherwise negative.   Physical Examination: Vitals:   03/20/21 1145  BP: 118/70  Pulse: (!) 46  Resp: 19  Temp: 97.6 F (36.4 C)  SpO2: 95%   Vitals:   03/20/21 1145  Weight: 233 lb (105.7 kg)  Height: 5\' 4"  (1.626 m)   Body mass index is 39.99 kg/m. Ideal Body Weight: Weight in (lb) to have BMI = 25: 145.3  GEN: no acute distress.  Obese, looks her normal self HEENT: Atraumatic, Normocephalic.   Bilateral TM wnl, oropharynx normal.  PEERL,EOMI.   Ears and Nose: No external deformity. CV: RRR, No M/G/R. No JVD. No thrill. No extra heart sounds. PULM: CTA B, no wheezes, crackles, rhonchi. No retractions. No resp. distress. No accessory muscle use. ABD: S, NT, ND, +BS.  No rebound. No HSM. EXTR: No c/c/e PSYCH: Normally interactive. Conversant.   BP Readings from Last 3 Encounters:  03/20/21 118/70  03/17/21 (!) 150/82  02/20/21 124/78   Pulse Readings from Last 3 Encounters:  03/20/21 (!) 46  03/17/21 78  02/20/21 69   EKG: Rate 87,sinus rhythm with frequent PACs, low voltage and poor R wave progression Compared to EKG from October frequent PACs her only new finding Assessment and Plan: Bradycardia - Plan: EKG 12-Lead, TSH  Cough  Fever, unspecified - Plan: CBC, Comprehensive metabolic panel  SOB (shortness of breath) - Plan: D-Dimer, Quantitative, Troponin I (High Sensitivity), B Nat Peptide  Bronchitis - Plan: doxycycline (VIBRAMYCIN) 100 MG capsule  Patient today with concern of cough, fevers and malaise for about 2-1/2 weeks.  She was seen in urgent care on Friday, had a chest x-ray which did not show pneumonia However, given prolonged symptoms and absence of other explanation we will start her on doxycycline for 10 days She also notes shortness of breath, though she has not tested positive [id number] is certainly a consideration.  Some concern for pulmonary embolism.  Will obtain a D-dimer today She has chronic chest pain, no particular change here.  EKG does show a change with frequent PACs.  We will obtain a stat troponin Will plan further follow- up pending labs.  This visit occurred during the SARS-CoV-2 public health emergency.  Safety protocols were in place, including screening questions prior to the visit, additional usage of staff PPE, and extensive cleaning of exam room while observing appropriate contact time as indicated for disinfecting solutions.    Signed Lamar Blinks, MD  Received her stat labs-call to discuss with patient.  Her troponin is negative but D-dimer is positive.  We offered ER evaluation, but given long duration of symptoms she feels comfortable doing a CT angiogram tomorrow.  I have asked my staff to begin  working on this urgent referral immediately Results for orders placed or performed in visit on 03/20/21  D-Dimer, Quantitative  Result Value Ref Range   D-Dimer, Quant 0.55 (H) <0.50 mcg/mL FEU  Troponin I (High Sensitivity)  Result Value Ref Range   High Sens Troponin I 13 2 - 17 ng/L   Addendum 6/7, received the rest of her labs and also her CT angiogram report Called both her numbers and did not reach her, left message on her cell phone Patient has history of thalassemia, red cell counts are baseline   Results for orders placed or performed in visit on 03/20/21  CBC  Result Value Ref Range   WBC 7.6 4.0 - 10.5 K/uL   RBC 5.07 3.87 - 5.11 Mil/uL  Platelets 353.0 150.0 - 400.0 K/uL   Hemoglobin 9.5 (L) 12.0 - 15.0 g/dL   HCT 30.6 (L) 36.0 - 46.0 %   MCV 60.5 Repeated and verified X2. (L) 78.0 - 100.0 fl   MCHC 30.9 30.0 - 36.0 g/dL   RDW 18.5 (H) 11.5 - 15.5 %  Comprehensive metabolic panel  Result Value Ref Range   Sodium 137 135 - 145 mEq/L   Potassium 4.0 3.5 - 5.1 mEq/L   Chloride 99 96 - 112 mEq/L   CO2 26 19 - 32 mEq/L   Glucose, Bld 135 (H) 70 - 99 mg/dL   BUN 15 6 - 23 mg/dL   Creatinine, Ser 1.19 0.40 - 1.20 mg/dL   Total Bilirubin 0.5 0.2 - 1.2 mg/dL   Alkaline Phosphatase 80 39 - 117 U/L   AST 19 0 - 37 U/L   ALT 28 0 - 35 U/L   Total Protein 7.2 6.0 - 8.3 g/dL   Albumin 4.1 3.5 - 5.2 g/dL   GFR 47.42 (L) >60.00 mL/min   Calcium 9.5 8.4 - 10.5 mg/dL  TSH  Result Value Ref Range   TSH 4.01 0.35 - 4.50 uIU/mL  D-Dimer, Quantitative  Result Value Ref Range   D-Dimer, Quant 0.55 (H) <0.50 mcg/mL FEU  B Nat Peptide  Result Value Ref Range   Pro B Natriuretic peptide (BNP) 57.0 0.0 - 100.0 pg/mL  Troponin I (High Sensitivity)  Result Value Ref Range   High Sens Troponin I 13 2 - 17 ng/L    CT Angio Chest W/Cm &/Or Wo Cm  Result Date: 03/21/2021 CLINICAL DATA:  Elevated D-dimer.  Cough EXAM: CT ANGIOGRAPHY CHEST WITH CONTRAST TECHNIQUE: Multidetector CT  imaging of the chest was performed using the standard protocol during bolus administration of intravenous contrast. Multiplanar CT image reconstructions and MIPs were obtained to evaluate the vascular anatomy. CONTRAST:  3mL OMNIPAQUE IOHEXOL 350 MG/ML SOLN COMPARISON:  08/13/2017 FINDINGS: Cardiovascular: No filling defects in the pulmonary arteries to suggest pulmonary emboli. Heart is mildly enlarged. Densely calcified mitral valve annulus and coronary arteries. Scattered aortic calcifications. No aneurysm. Mediastinum/Nodes: Shotty mediastinal lymph nodes, none pathologically enlarged or change since prior study. No mediastinal, hilar, or axillary adenopathy. Trachea and esophagus are unremarkable. Thyroid unremarkable. Lungs/Pleura: Scattered ground-glass opacities in the lungs, favor atelectasis or areas of air trapping. No confluent opacities or effusions. Upper Abdomen: Imaging into the upper abdomen demonstrates no acute findings. Musculoskeletal: Chest wall soft tissues are unremarkable. No acute bony abnormality. Review of the MIP images confirms the above findings. IMPRESSION: No evidence of pulmonary embolus. Cardiomegaly, coronary artery disease. Scattered areas of ground-glass opacity, likely atelectasis. Aortic Atherosclerosis (ICD10-I70.0). Electronically Signed   By: Rolm Baptise M.D.   On: 03/21/2021 11:29

## 2021-03-17 NOTE — ED Triage Notes (Signed)
Patient presents to Urgent Care with complaints of cough, body aches, SOB x 2 weeks ago. Pt states she has a hx of HF and has not noted any weight gain. Weight x 3 days ago 235 lb last weight x 2 days ago 232 lbs she does not weighed herself today. Treating her symptoms with otc allergy meds.   Denies fever at this time.

## 2021-03-17 NOTE — ED Triage Notes (Signed)
Pt present coughing, body aches and nausea. Pt states symptoms started two weeks ago.  Pt is having throat pain, SOB and dry heaves .

## 2021-03-17 NOTE — Discharge Instructions (Addendum)
-  Flovent inhaler, 2 puffs twice daily.  Continue this for about 1 week, longer if it is helping. -Make sure to swish and spit water afterwards to prevent yeast infection. -Continue albuterol nebulizer solution. -Continue monitoring weights at home, follow-up if they start increasing. -Also seek additional medical attention if you develop new symptoms like worsening shortness of breath, fever/chills, chest pain, dizziness, wheezing, weakness

## 2021-03-17 NOTE — ED Provider Notes (Signed)
[facility]    CSN: [id number] Arrival date & time: 03/17/21  1004      History   Chief Complaint Chief Complaint  Patient presents with  . Nausea  . Cough  . Generalized Body Aches    HPI Arbor Leer is a 65 y.o. female presenting with cough x2 weeks. Medical history anxiety, arthritis, CAD, diastolic dysfunction, headaches, stomach ulcers, pneumonia, rheumatoid arthritis, COPD, former smoker. Today notes progressively worsening shortness of breath, worse with exertion. Productive cough with white sputum. Temperature as high as 101.8 last night per home thermometer. Nausea and diarrhea but denies vomiting. Albuterol nebulizer solution providing minimal relief. Has not taken antipyretic today   She has intermittent left-sided chest pain at baseline, followed by cardiology for this, denies changes in this. Taking nitrate per pt and diltiazem for this as directed by PCP. Denies current chest pain, dizziness, L arm pain, jaw pain.   History of chronic heart failure and diastolic dysfunction, she checks her weight at home.  This was 235 two days ago but is 232 today on her home scale. Does not take a water pill.    HPI  Past Medical History:  Diagnosis Date  . Anginal pain ([place])   . Anxiety   . Arthritis    "knees, wrists, back, elbows" (07/03/2017)  . Clotting disorder ([place])    blood clot in eye 2016  . Coronary artery disease, non-occlusive    a. 11/2016 NSTEMI/Cath: LM nl, LAD 40p, 63md, D1/2 small, LCX 40ost, OM2/3 nl/small, RCA 35 ost/mid, RPDA/RPL small/nl.  . Depression   . Diastolic dysfunction    a. 11/2016 Echo: EF 55-60%, gr1 DD, sev calcified MV annulus, mildly dil LA.  Marland Kitchen Eye hemorrhage 01/2015   "right; resolved" (07/03/2017)  . Headache    "monthly" (07/03/2017)  . Heart murmur   . History of blood transfusion    "when I had an ectopic pregnancy"  . History of stomach ulcers   . Hyperlipidemia   . Hypertension   . Microcytic anemia   .  OSA on CPAP    setting is unknown  . Pneumonia    "several times" (07/03/2017)  . Syncope 07/03/2017 X 2   called seizures but no medications  . Thalassemia    "my cells are sickle cell shaped but I don't have sickle cell anemia" (07/03/2017)  . Type II diabetes mellitus [facility])     Patient Active Problem List   Diagnosis Date Noted  . Coronary artery spasm ([place]) 08/09/2020  . Rheumatoid arthritis ([place]) 11/09/2019  . Toe pain, bilateral 05/26/2019  . Type 2 diabetes mellitus with hyperglycemia, with long-term current use of insulin ([place]) 05/26/2019  . Mobility impaired 04/01/2019  . Morbid obesity due to excess calories ([place]) 12/03/2017  . Dyspnea on exertion 12/02/2017  . Mitral stenosis 08/23/2017  . Diastolic heart failure ([place]) 08/12/2017  . Acute respiratory failure with hypoxia ([place]) 08/12/2017  . OSA on CPAP 08/12/2017  . COPD with acute bronchitis ([place]) 08/12/2017  . Near syncope 07/03/2017  . Anxiety 07/03/2017  . Coronary artery disease, non-occlusive 12/08/2016  . PAT (paroxysmal atrial tachycardia) ([place])   . Demand myocardial infarction ([place]) 11/16/2016  . H/O total knee replacement 08/19/2015  . Vision, loss, sudden 03/07/2015  . S/P TKR (total knee replacement) using cement 02/18/2015  . Thalassemia 09/02/2013  . Hyperlipidemia with target low density lipoprotein (LDL) cholesterol less than 100 mg/dL 08/24/2012  . Essential hypertension 07/22/2012    Past Surgical History:  Procedure Laterality Date  . 48-Hour Monitor  03/18/2017   Sinus rhythm with sinus tachycardia (rate 58-134 BPM)multiple PVCs noted with couplets and bigeminy. One triplet. 7 runs of PAT ranging from 100-130 bpm. Longest was 33 beats.  Marland Kitchen BREAST BIOPSY Left   . CARDIAC CATHETERIZATION N/A 11/19/2016   Procedure: Left Heart Cath and Coronary Angiography;  Surgeon: Belva Crome, MD;  Location: [place];  Service: Cardiovascular.    LM nl, LAD 40p, 44md, D1/2 small, LCX 40ost, OM2/3  nl/small, RCA 35 ost/mid, RPDA/RPL small/nl.  . CARPAL TUNNEL RELEASE Right 11/01/2014  . COLONOSCOPY    . DILATION AND CURETTAGE OF UTERUS    . ECTOPIC PREGNANCY SURGERY  X 2  . JOINT REPLACEMENT    . KNEE ARTHROSCOPY Left 11/2014  . TONSILLECTOMY    . TOTAL KNEE ARTHROPLASTY Right 02/18/2015   Procedure: RIGHT TOTAL KNEE ARTHROPLASTY;  Surgeon: Netta Cedars, MD;  Location: [place];  Service: Orthopedics;  Laterality: Right;  . TOTAL KNEE ARTHROPLASTY Left 08/19/2015   Procedure: LEFT TOTAL KNEE ARTHROPLASTY;  Surgeon: Netta Cedars, MD;  Location: [place];  Service: Orthopedics;  Laterality: Left;  . TRANSTHORACIC ECHOCARDIOGRAM  11/2016; 07/2017:   a) normal LV size, thickness and function. EF 55-60%. GR 1 DD.No RWMA severe mitral annular calcification, but no mitral stenosis. Mild LA dilation;; b) normal LV size and function.  EF 65-75%.  Unable to assess diastolic function.  Aortic sclerosis with no stenosis.  Moderate mitral stenosis? (Gradient 9 mmHg) -?  Mild-mod left atrial enlargement   . TRANSTHORACIC ECHOCARDIOGRAM  01/2019   EF 65 to 70%.  Normal function.  Elevated LVEDP-GR 1 DD.  Normal RV size and function.  Large calcific mass on posterior mitral leaflet mild to moderate mitral stenosis.  Marland Kitchen VAGINAL HYSTERECTOMY      OB History   No obstetric history on file.      Home Medications    Prior to Admission medications   Medication Sig Start Date End Date Taking? Authorizing Provider  fluticasone (FLOVENT DISKUS) 50 MCG/BLIST diskus inhaler Inhale 1 puff into the lungs 2 (two) times daily. 03/17/21  Yes Hazel Sams, PA-C  atorvastatin (LIPITOR) 80 MG tablet TAKE 1 TABLET(80 MG) BY MOUTH DAILY AT 6 PM 02/20/21   Copland, Gay Filler, MD  cetirizine (ZYRTEC) 10 MG tablet Take 10 mg by mouth daily as needed for allergies.    [provider]  Continuous Blood Gluc Sensor (DEXCOM G6 SENSOR) MISC 1 Device by Does not apply route as directed. 06/17/20   [name], Melanie Crazier,  MD  Continuous Blood Gluc Transmit (DEXCOM G6 TRANSMITTER) MISC 1 Device by Does not apply route as directed. 06/17/20   [name], Melanie Crazier, MD  diltiazem (CARDIZEM CD) 240 MG 24 hr capsule Take 1 capsule (240 mg total) by mouth daily. 08/09/20 11/07/20  Leonie Man, MD  folic acid (FOLVITE) 1 MG tablet Take 2 tablets (2 mg total) by mouth daily. 01/21/20   Copland, Gay Filler, MD  glucose blood (CONTOUR NEXT TEST) test strip 3x daily 07/20/19   [name], Melanie Crazier, MD  hydrochlorothiazide (HYDRODIURIL) 12.5 MG tablet Take one by mouth daily 01/21/20   Copland, Gay Filler, MD  Insulin Disposable Pump (OMNIPOD DASH PODS, GEN 4,) MISC  10/03/20   [provider]  insulin lispro (HUMALOG) 100 UNIT/ML injection Inject into the skin. 02/07/21   [provider]  Insulin Pen Needle (B-D UF III MINI PEN NEEDLES) 31G X 5  MM MISC USE DAILY WITH VICTOZA AND LANTUS. 07/20/19   [name], Melanie Crazier, MD  Insulin Pen Needle 31G X 5 MM MISC 1 Device by Does not apply route 3 (three) times daily. 01/21/20   Copland, Gay Filler, MD  isosorbide mononitrate (IMDUR) 30 MG 24 hr tablet Take 30 mg  tablet as needed for Chest pain spasm episodes for 3 days and the stop Patient not taking: Reported on 02/20/2021 08/09/20   Leonie Man, MD  Lancets 30G MISC 1 Device by Does not apply route 3 (three) times daily. 07/16/19   Copland, Gay Filler, MD  losartan (COZAAR) 100 MG tablet Take 1 tablet (100 mg total) by mouth daily. 08/09/20 11/07/20  Leonie Man, MD  methotrexate 50 MG/2ML injection  08/11/19   [provider]  Multiple Vitamin (MULTIVITAMIN WITH MINERALS) TABS tablet Take 1 tablet by mouth daily. Reported on 01/03/2016    [provider]  SAFETY-LOK TB SYRINGE 27GX.5" 27G X 1/2" 1 ML MISC  08/18/19   [provider]  [place] 18 MG/3ML SOPN INJECT 1.8 MG UNDER THE SKIN ONCE DAILY 02/20/21   Copland, Gay Filler, MD    Family History Family History   Problem Relation Age of Onset  . Cancer Mother   . Diabetes Mother   . Hypertension Mother   . Hyperlipidemia Mother   . Cancer Father   . Hyperlipidemia Father   . Mental illness Sister   . Diabetes Sister   . Diabetes Maternal Grandmother   . Diabetes Maternal Grandfather   . Colon cancer Neg Hx   . Esophageal cancer Neg Hx   . Stomach cancer Neg Hx   . Rectal cancer Neg Hx     Social History Social History   Tobacco Use  . Smoking status: Former Smoker    Packs/day: 0.25    Years: 44.00    Pack years: 11.00    Types: Cigarettes    Quit date: 02/17/2015    Years since quitting: 6.0  . Smokeless tobacco: Never Used  Vaping Use  . Vaping Use: Never used  Substance Use Topics  . Alcohol use: Yes    Alcohol/week: 0.0 standard drinks    Comment: 07/03/2017 "might have a few drinks/year"  . Drug use: No     Allergies   Penicillins, Farxiga [dapagliflozin], and Metformin and related   Review of Systems Review of Systems  Constitutional: Negative for appetite change, chills and fever.  HENT: Positive for congestion. Negative for ear pain, rhinorrhea, sinus pressure, sinus pain and sore throat.   Eyes: Negative for redness and visual disturbance.  Respiratory: Positive for cough and shortness of breath. Negative for chest tightness and wheezing.   Cardiovascular: Negative for chest pain and palpitations.  Gastrointestinal: Negative for abdominal pain, constipation, diarrhea, nausea and vomiting.  Genitourinary: Negative for dysuria, frequency and urgency.  Musculoskeletal: Negative for myalgias.  Neurological: Negative for dizziness, weakness and headaches.  Psychiatric/Behavioral: Negative for confusion.  All other systems reviewed and are negative.    Physical Exam Triage Vital Signs ED Triage Vitals  Enc Vitals Group     BP 03/17/21 1205 (!) 150/82     Pulse Rate 03/17/21 1205 77     Resp 03/17/21 1205 18     Temp 03/17/21 1205 98.9 F (37.2 C)     Temp  Source 03/17/21 1205 Oral     SpO2 03/17/21 1205 91 %     Weight --      Height --  Head Circumference --      Peak Flow --      Pain Score 03/17/21 1206 8     Pain Loc --      Pain Edu? --      Excl. in [place]? --    No data found.  Updated Vital Signs BP (!) 150/82 (BP Location: Right Arm)   Pulse 78   Temp 98.9 F (37.2 C) (Oral)   Resp 18   LMP  (LMP Unknown)   SpO2 (S) 93% Comment: provider notified  Visual Acuity Right Eye Distance:   Left Eye Distance:   Bilateral Distance:    Right Eye Near:   Left Eye Near:    Bilateral Near:     Physical Exam Vitals reviewed.  Constitutional:      General: She is not in acute distress.    Appearance: Normal appearance. She is not ill-appearing.  HENT:     Head: Normocephalic and atraumatic.     Right Ear: Hearing, tympanic membrane, ear canal and external ear normal. No swelling or tenderness. There is no impacted cerumen. No mastoid tenderness. Tympanic membrane is not perforated, erythematous, retracted or bulging.     Left Ear: Hearing, tympanic membrane, ear canal and external ear normal. No swelling or tenderness. There is no impacted cerumen. No mastoid tenderness. Tympanic membrane is not perforated, erythematous, retracted or bulging.     Nose:     Right Sinus: No maxillary sinus tenderness or frontal sinus tenderness.     Left Sinus: No maxillary sinus tenderness or frontal sinus tenderness.     Mouth/Throat:     Mouth: Mucous membranes are moist.     Pharynx: Uvula midline. No oropharyngeal exudate or posterior oropharyngeal erythema.     Tonsils: No tonsillar exudate.  Cardiovascular:     Rate and Rhythm: Normal rate and regular rhythm.     Heart sounds: Normal heart sounds.  Pulmonary:     Breath sounds: Normal breath sounds and air entry. No decreased breath sounds, wheezing, rhonchi or rales.     Comments: Frequent cough Chest:     Chest wall: No tenderness.  Abdominal:     General: Abdomen is flat.  Bowel sounds are normal.     Tenderness: There is no abdominal tenderness. There is no guarding or rebound.  Lymphadenopathy:     Cervical: No cervical adenopathy.  Neurological:     General: No focal deficit present.     Mental Status: She is alert and oriented to person, place, and time.  Psychiatric:        Attention and Perception: Attention and perception normal.        Mood and Affect: Mood and affect normal.        Behavior: Behavior normal. Behavior is cooperative.        Thought Content: Thought content normal.        Judgment: Judgment normal.      UC Treatments / Results  Labs (all labs ordered are listed, but only abnormal results are displayed) Labs Reviewed - No data to display  EKG   Radiology DG Chest 2 View  Result Date: 03/17/2021 CLINICAL DATA:  Shortness of breath and cough EXAM: CHEST - 2 VIEW COMPARISON:  December 02, 2017 chest radiograph and chest CT August 13, 2017 FINDINGS: Interstitium is mildly thickened. No edema or airspace opacity. Heart is upper normal in size with pulmonary vascularity, stable. No adenopathy. There is aortic atherosclerosis. No bone lesions. Calcification noted in  the region of the mitral valve/mitral annulus, unchanged. IMPRESSION: Interstitial thickening, likely indicative of a degree of underlying chronic bronchitis. No edema or airspace opacity. Heart upper normal in size. Stable calcification in the region of the mitral valve/mitral annulus. Aortic Atherosclerosis (ICD10-I70.0). Electronically Signed   By: Lowella Grip III M.D.   On: 03/17/2021 13:40    Procedures Procedures (including critical care time)  Medications Ordered in UC Medications - No data to display  Initial Impression / Assessment and Plan / UC Course  I have reviewed the triage vital signs and the nursing notes.  Pertinent labs & imaging results that were available during my care of the patient were reviewed by me and considered in my medical decision  making (see chart for details).     This patient is a 65 year old female presenting with acute bronchitis/COPD exacerbation. Afebrile, nontachycardic, but she is oxygenating poorly on room air 93%; her baseline is 98%. Pt with chronic disatolic heart failure. Former smoker, also with COPD and history bronchitis and pneumonia. Using albuterol nebulizer.  She does have a history of diastolic dysfunction and CHF. Home weights are stable, and there is no pedal edema today. No rales.   CXR- Interstitial thickening, likely indicative of a degree of underlying chronic bronchitis. No edema or airspace opacity. Heart upper normal in size. Stable calcification in the region of the mitral valve/mitral annulus. Aortic Atherosclerosis (ICD10-I70.0).  EKG today with low voltage, but NSR. Unchanged from 2021 EKG.  Two negative home covid tests, patient declines PCR today.   This patient does have Type 2 diabetes on insulin, nonfasting sugars running 150s at home today. CMP with fasting glucose 93 on 02/22/21. Discussed prednisone vs steroid inhaler. Patient expresses strong preference for inhaler, so sent as below.   For RA, continue current regimen.   ED return precautions discussed.   Coding this visit a Level 4 for review of past notes (5/9), and ordering and interpretation of EKG today. Prescription drug management .  Final Clinical Impressions(s) / UC Diagnoses   Final diagnoses:  Acute bronchitis, unspecified organism  Simple chronic bronchitis (HCC)  Diastolic dysfunction  Type 2 diabetes mellitus with other specified complication, with long-term current use of insulin ([place])  Former smoker  Rheumatoid arthritis, involving unspecified site, unspecified whether rheumatoid factor present ([place])     Discharge Instructions     -Flovent inhaler, 2 puffs twice daily.  Continue this for about 1 week, longer if it is helping. -Make sure to swish and spit water afterwards to prevent yeast  infection. -Continue albuterol nebulizer solution. -Continue monitoring weights at home, follow-up if they start increasing. -Also seek additional medical attention if you develop new symptoms like worsening shortness of breath, fever/chills, chest pain, dizziness, wheezing, weakness    ED Prescriptions    Medication Sig Dispense Auth. Provider   fluticasone (FLOVENT DISKUS) 50 MCG/BLIST diskus inhaler Inhale 1 puff into the lungs 2 (two) times daily. 1 each Hazel Sams, PA-C     PDMP not reviewed this encounter.   Hazel Sams, PA-C 03/17/21 1420

## 2021-03-20 ENCOUNTER — Other Ambulatory Visit (HOSPITAL_BASED_OUTPATIENT_CLINIC_OR_DEPARTMENT_OTHER): Payer: Self-pay

## 2021-03-20 ENCOUNTER — Other Ambulatory Visit: Payer: Self-pay

## 2021-03-20 ENCOUNTER — Ambulatory Visit (INDEPENDENT_AMBULATORY_CARE_PROVIDER_SITE_OTHER): Payer: Medicare Other | Admitting: Family Medicine

## 2021-03-20 ENCOUNTER — Encounter: Payer: Self-pay | Admitting: Family Medicine

## 2021-03-20 VITALS — BP 118/70 | HR 46 | Temp 97.6°F | Resp 19 | Ht 64.0 in | Wt 233.0 lb

## 2021-03-20 DIAGNOSIS — R071 Chest pain on breathing: Secondary | ICD-10-CM

## 2021-03-20 DIAGNOSIS — R059 Cough, unspecified: Secondary | ICD-10-CM

## 2021-03-20 DIAGNOSIS — R001 Bradycardia, unspecified: Secondary | ICD-10-CM | POA: Diagnosis not present

## 2021-03-20 DIAGNOSIS — J40 Bronchitis, not specified as acute or chronic: Secondary | ICD-10-CM | POA: Diagnosis not present

## 2021-03-20 DIAGNOSIS — R509 Fever, unspecified: Secondary | ICD-10-CM | POA: Diagnosis not present

## 2021-03-20 DIAGNOSIS — R0602 Shortness of breath: Secondary | ICD-10-CM

## 2021-03-20 LAB — CBC
HCT: 30.6 % — ABNORMAL LOW (ref 36.0–46.0)
Hemoglobin: 9.5 g/dL — ABNORMAL LOW (ref 12.0–15.0)
MCHC: 30.9 g/dL (ref 30.0–36.0)
MCV: 60.5 fl — ABNORMAL LOW (ref 78.0–100.0)
Platelets: 353 10*3/uL (ref 150.0–400.0)
RBC: 5.07 Mil/uL (ref 3.87–5.11)
RDW: 18.5 % — ABNORMAL HIGH (ref 11.5–15.5)
WBC: 7.6 10*3/uL (ref 4.0–10.5)

## 2021-03-20 LAB — BRAIN NATRIURETIC PEPTIDE: Pro B Natriuretic peptide (BNP): 57 pg/mL (ref 0.0–100.0)

## 2021-03-20 LAB — TROPONIN I (HIGH SENSITIVITY): High Sens Troponin I: 13 ng/L (ref 2–17)

## 2021-03-20 LAB — TSH: TSH: 4.01 u[IU]/mL (ref 0.35–4.50)

## 2021-03-20 LAB — D-DIMER, QUANTITATIVE: D-Dimer, Quant: 0.55 mcg/mL FEU — ABNORMAL HIGH (ref <0.50)

## 2021-03-20 MED ORDER — DOXYCYCLINE HYCLATE 100 MG PO CAPS
100.0000 mg | ORAL_CAPSULE | Freq: Two times a day (BID) | ORAL | 0 refills | Status: DC
  Filled 2021-03-20: qty 20, 10d supply, fill #0

## 2021-03-20 NOTE — Patient Instructions (Signed)
Good to see you today!  I will be in touch with your labs asap If your D dimer is positive we will plan to do a CT angiogram to check for any blood clot For now use doxycycline antibiotic twice a day for 10 days

## 2021-03-20 NOTE — Addendum Note (Signed)
Addended by: Lamar Blinks C on: 03/20/2021 04:40 PM   Modules accepted: Orders

## 2021-03-21 ENCOUNTER — Ambulatory Visit (HOSPITAL_BASED_OUTPATIENT_CLINIC_OR_DEPARTMENT_OTHER)
Admission: RE | Admit: 2021-03-21 | Discharge: 2021-03-21 | Disposition: A | Discharge status: Home / Self Care | Payer: Medicare Other | Source: Ambulatory Visit | Attending: Family Medicine | Admitting: Family Medicine

## 2021-03-21 ENCOUNTER — Encounter: Payer: Self-pay | Admitting: Family Medicine

## 2021-03-21 ENCOUNTER — Encounter (HOSPITAL_BASED_OUTPATIENT_CLINIC_OR_DEPARTMENT_OTHER): Payer: Self-pay

## 2021-03-21 DIAGNOSIS — J9601 Acute respiratory failure with hypoxia: Secondary | ICD-10-CM | POA: Diagnosis not present

## 2021-03-21 DIAGNOSIS — K2970 Gastritis, unspecified, without bleeding: Secondary | ICD-10-CM | POA: Diagnosis not present

## 2021-03-21 DIAGNOSIS — D72820 Lymphocytosis (symptomatic): Secondary | ICD-10-CM | POA: Diagnosis not present

## 2021-03-21 DIAGNOSIS — R531 Weakness: Secondary | ICD-10-CM | POA: Diagnosis not present

## 2021-03-21 DIAGNOSIS — R071 Chest pain on breathing: Secondary | ICD-10-CM | POA: Insufficient documentation

## 2021-03-21 DIAGNOSIS — E1122 Type 2 diabetes mellitus with diabetic chronic kidney disease: Secondary | ICD-10-CM | POA: Diagnosis not present

## 2021-03-21 DIAGNOSIS — K254 Chronic or unspecified gastric ulcer with hemorrhage: Secondary | ICD-10-CM | POA: Diagnosis not present

## 2021-03-21 DIAGNOSIS — Y929 Unspecified place or not applicable: Secondary | ICD-10-CM | POA: Diagnosis not present

## 2021-03-21 DIAGNOSIS — Z794 Long term (current) use of insulin: Secondary | ICD-10-CM | POA: Diagnosis not present

## 2021-03-21 DIAGNOSIS — R509 Fever, unspecified: Secondary | ICD-10-CM | POA: Diagnosis not present

## 2021-03-21 DIAGNOSIS — I5032 Chronic diastolic (congestive) heart failure: Secondary | ICD-10-CM | POA: Diagnosis not present

## 2021-03-21 DIAGNOSIS — M0520 Rheumatoid vasculitis with rheumatoid arthritis of unspecified site: Secondary | ICD-10-CM | POA: Diagnosis not present

## 2021-03-21 DIAGNOSIS — J704 Drug-induced interstitial lung disorders, unspecified: Secondary | ICD-10-CM | POA: Diagnosis not present

## 2021-03-21 DIAGNOSIS — K3189 Other diseases of stomach and duodenum: Secondary | ICD-10-CM | POA: Diagnosis not present

## 2021-03-21 DIAGNOSIS — J849 Interstitial pulmonary disease, unspecified: Secondary | ICD-10-CM | POA: Diagnosis not present

## 2021-03-21 DIAGNOSIS — D563 Thalassemia minor: Secondary | ICD-10-CM | POA: Diagnosis present

## 2021-03-21 DIAGNOSIS — J679 Hypersensitivity pneumonitis due to unspecified organic dust: Secondary | ICD-10-CM | POA: Diagnosis not present

## 2021-03-21 DIAGNOSIS — J9690 Respiratory failure, unspecified, unspecified whether with hypoxia or hypercapnia: Secondary | ICD-10-CM | POA: Diagnosis not present

## 2021-03-21 DIAGNOSIS — Y92239 Unspecified place in hospital as the place of occurrence of the external cause: Secondary | ICD-10-CM | POA: Diagnosis not present

## 2021-03-21 DIAGNOSIS — J449 Chronic obstructive pulmonary disease, unspecified: Secondary | ICD-10-CM | POA: Diagnosis not present

## 2021-03-21 DIAGNOSIS — K2211 Ulcer of esophagus with bleeding: Secondary | ICD-10-CM | POA: Diagnosis not present

## 2021-03-21 DIAGNOSIS — R195 Other fecal abnormalities: Secondary | ICD-10-CM | POA: Diagnosis not present

## 2021-03-21 DIAGNOSIS — R61 Generalized hyperhidrosis: Secondary | ICD-10-CM | POA: Diagnosis not present

## 2021-03-21 DIAGNOSIS — R0902 Hypoxemia: Secondary | ICD-10-CM | POA: Diagnosis not present

## 2021-03-21 DIAGNOSIS — K5750 Diverticulosis of both small and large intestine without perforation or abscess without bleeding: Secondary | ICD-10-CM | POA: Diagnosis not present

## 2021-03-21 DIAGNOSIS — I517 Cardiomegaly: Secondary | ICD-10-CM | POA: Diagnosis not present

## 2021-03-21 DIAGNOSIS — Z9189 Other specified personal risk factors, not elsewhere classified: Secondary | ICD-10-CM | POA: Diagnosis not present

## 2021-03-21 DIAGNOSIS — I330 Acute and subacute infective endocarditis: Secondary | ICD-10-CM | POA: Diagnosis not present

## 2021-03-21 DIAGNOSIS — K449 Diaphragmatic hernia without obstruction or gangrene: Secondary | ICD-10-CM | POA: Diagnosis not present

## 2021-03-21 DIAGNOSIS — I5030 Unspecified diastolic (congestive) heart failure: Secondary | ICD-10-CM | POA: Diagnosis not present

## 2021-03-21 DIAGNOSIS — I10 Essential (primary) hypertension: Secondary | ICD-10-CM | POA: Diagnosis not present

## 2021-03-21 DIAGNOSIS — N1831 Chronic kidney disease, stage 3a: Secondary | ICD-10-CM | POA: Diagnosis not present

## 2021-03-21 DIAGNOSIS — E876 Hypokalemia: Secondary | ICD-10-CM | POA: Diagnosis not present

## 2021-03-21 DIAGNOSIS — D62 Acute posthemorrhagic anemia: Secondary | ICD-10-CM | POA: Diagnosis not present

## 2021-03-21 DIAGNOSIS — D509 Iron deficiency anemia, unspecified: Secondary | ICD-10-CM | POA: Diagnosis not present

## 2021-03-21 DIAGNOSIS — I130 Hypertensive heart and chronic kidney disease with heart failure and stage 1 through stage 4 chronic kidney disease, or unspecified chronic kidney disease: Secondary | ICD-10-CM | POA: Diagnosis not present

## 2021-03-21 DIAGNOSIS — M069 Rheumatoid arthritis, unspecified: Secondary | ICD-10-CM | POA: Diagnosis not present

## 2021-03-21 DIAGNOSIS — R110 Nausea: Secondary | ICD-10-CM | POA: Diagnosis not present

## 2021-03-21 DIAGNOSIS — D649 Anemia, unspecified: Secondary | ICD-10-CM | POA: Diagnosis not present

## 2021-03-21 DIAGNOSIS — I471 Supraventricular tachycardia: Secondary | ICD-10-CM | POA: Diagnosis not present

## 2021-03-21 DIAGNOSIS — I25111 Atherosclerotic heart disease of native coronary artery with angina pectoris with documented spasm: Secondary | ICD-10-CM | POA: Diagnosis not present

## 2021-03-21 DIAGNOSIS — I342 Nonrheumatic mitral (valve) stenosis: Secondary | ICD-10-CM | POA: Diagnosis not present

## 2021-03-21 DIAGNOSIS — I081 Rheumatic disorders of both mitral and tricuspid valves: Secondary | ICD-10-CM | POA: Diagnosis not present

## 2021-03-21 DIAGNOSIS — T380X5A Adverse effect of glucocorticoids and synthetic analogues, initial encounter: Secondary | ICD-10-CM | POA: Diagnosis not present

## 2021-03-21 DIAGNOSIS — E1165 Type 2 diabetes mellitus with hyperglycemia: Secondary | ICD-10-CM | POA: Diagnosis not present

## 2021-03-21 DIAGNOSIS — I2510 Atherosclerotic heart disease of native coronary artery without angina pectoris: Secondary | ICD-10-CM | POA: Diagnosis not present

## 2021-03-21 DIAGNOSIS — R059 Cough, unspecified: Secondary | ICD-10-CM | POA: Diagnosis not present

## 2021-03-21 DIAGNOSIS — K2950 Unspecified chronic gastritis without bleeding: Secondary | ICD-10-CM | POA: Diagnosis not present

## 2021-03-21 DIAGNOSIS — K921 Melena: Secondary | ICD-10-CM | POA: Diagnosis not present

## 2021-03-21 DIAGNOSIS — R0602 Shortness of breath: Secondary | ICD-10-CM | POA: Diagnosis not present

## 2021-03-21 DIAGNOSIS — K2210 Ulcer of esophagus without bleeding: Secondary | ICD-10-CM | POA: Diagnosis not present

## 2021-03-21 DIAGNOSIS — J84112 Idiopathic pulmonary fibrosis: Secondary | ICD-10-CM | POA: Diagnosis not present

## 2021-03-21 DIAGNOSIS — D849 Immunodeficiency, unspecified: Secondary | ICD-10-CM | POA: Diagnosis not present

## 2021-03-21 DIAGNOSIS — I272 Pulmonary hypertension, unspecified: Secondary | ICD-10-CM | POA: Diagnosis not present

## 2021-03-21 DIAGNOSIS — Z6841 Body Mass Index (BMI) 40.0 and over, adult: Secondary | ICD-10-CM | POA: Diagnosis not present

## 2021-03-21 DIAGNOSIS — J209 Acute bronchitis, unspecified: Secondary | ICD-10-CM | POA: Diagnosis not present

## 2021-03-21 DIAGNOSIS — Z20822 Contact with and (suspected) exposure to covid-19: Secondary | ICD-10-CM | POA: Diagnosis not present

## 2021-03-21 DIAGNOSIS — K259 Gastric ulcer, unspecified as acute or chronic, without hemorrhage or perforation: Secondary | ICD-10-CM | POA: Diagnosis not present

## 2021-03-21 DIAGNOSIS — S83242A Other tear of medial meniscus, current injury, left knee, initial encounter: Secondary | ICD-10-CM | POA: Diagnosis not present

## 2021-03-21 DIAGNOSIS — K250 Acute gastric ulcer with hemorrhage: Secondary | ICD-10-CM | POA: Diagnosis not present

## 2021-03-21 LAB — COMPREHENSIVE METABOLIC PANEL
ALT: 28 U/L (ref 0–35)
AST: 19 U/L (ref 0–37)
Albumin: 4.1 g/dL (ref 3.5–5.2)
Alkaline Phosphatase: 80 U/L (ref 39–117)
BUN: 15 mg/dL (ref 6–23)
CO2: 26 mEq/L (ref 19–32)
Calcium: 9.5 mg/dL (ref 8.4–10.5)
Chloride: 99 mEq/L (ref 96–112)
Creatinine, Ser: 1.19 mg/dL (ref 0.40–1.20)
GFR: 47.42 mL/min — ABNORMAL LOW (ref >60.00)
Glucose, Bld: 135 mg/dL — ABNORMAL HIGH (ref 70–99)
Potassium: 4 mEq/L (ref 3.5–5.1)
Sodium: 137 mEq/L (ref 135–145)
Total Bilirubin: 0.5 mg/dL (ref 0.2–1.2)
Total Protein: 7.2 g/dL (ref 6.0–8.3)

## 2021-03-21 MED ORDER — IOHEXOL 350 MG/ML SOLN
75.0000 mL | Freq: Once | INTRAVENOUS | Status: AC | PRN
  Administered 2021-03-21: 75 mL via INTRAVENOUS

## 2021-03-23 ENCOUNTER — Emergency Department (HOSPITAL_COMMUNITY): Payer: Medicare Other

## 2021-03-23 ENCOUNTER — Other Ambulatory Visit: Payer: Self-pay

## 2021-03-23 ENCOUNTER — Encounter (HOSPITAL_COMMUNITY): Payer: Self-pay

## 2021-03-23 ENCOUNTER — Inpatient Hospital Stay (HOSPITAL_COMMUNITY)
Admission: EM | Admit: 2021-03-23 | Discharge: 2021-04-03 | DRG: 196 | Disposition: A | Discharge status: Home / Self Care | Payer: Medicare Other | Attending: Student | Admitting: Student

## 2021-03-23 ENCOUNTER — Encounter: Payer: Self-pay | Admitting: Family Medicine

## 2021-03-23 DIAGNOSIS — I081 Rheumatic disorders of both mitral and tricuspid valves: Secondary | ICD-10-CM | POA: Diagnosis present

## 2021-03-23 DIAGNOSIS — R195 Other fecal abnormalities: Secondary | ICD-10-CM | POA: Diagnosis not present

## 2021-03-23 DIAGNOSIS — J449 Chronic obstructive pulmonary disease, unspecified: Secondary | ICD-10-CM | POA: Diagnosis present

## 2021-03-23 DIAGNOSIS — E1165 Type 2 diabetes mellitus with hyperglycemia: Secondary | ICD-10-CM

## 2021-03-23 DIAGNOSIS — J209 Acute bronchitis, unspecified: Secondary | ICD-10-CM | POA: Diagnosis not present

## 2021-03-23 DIAGNOSIS — F419 Anxiety disorder, unspecified: Secondary | ICD-10-CM | POA: Diagnosis present

## 2021-03-23 DIAGNOSIS — K5750 Diverticulosis of both small and large intestine without perforation or abscess without bleeding: Secondary | ICD-10-CM | POA: Diagnosis not present

## 2021-03-23 DIAGNOSIS — D563 Thalassemia minor: Secondary | ICD-10-CM | POA: Diagnosis present

## 2021-03-23 DIAGNOSIS — I5032 Chronic diastolic (congestive) heart failure: Secondary | ICD-10-CM | POA: Diagnosis present

## 2021-03-23 DIAGNOSIS — Z818 Family history of other mental and behavioral disorders: Secondary | ICD-10-CM

## 2021-03-23 DIAGNOSIS — Z6841 Body Mass Index (BMI) 40.0 and over, adult: Secondary | ICD-10-CM | POA: Diagnosis not present

## 2021-03-23 DIAGNOSIS — K921 Melena: Secondary | ICD-10-CM | POA: Diagnosis not present

## 2021-03-23 DIAGNOSIS — D649 Anemia, unspecified: Secondary | ICD-10-CM | POA: Diagnosis not present

## 2021-03-23 DIAGNOSIS — I130 Hypertensive heart and chronic kidney disease with heart failure and stage 1 through stage 4 chronic kidney disease, or unspecified chronic kidney disease: Secondary | ICD-10-CM | POA: Diagnosis present

## 2021-03-23 DIAGNOSIS — J84112 Idiopathic pulmonary fibrosis: Secondary | ICD-10-CM | POA: Diagnosis not present

## 2021-03-23 DIAGNOSIS — Y92239 Unspecified place in hospital as the place of occurrence of the external cause: Secondary | ICD-10-CM | POA: Diagnosis not present

## 2021-03-23 DIAGNOSIS — E1169 Type 2 diabetes mellitus with other specified complication: Secondary | ICD-10-CM

## 2021-03-23 DIAGNOSIS — R0602 Shortness of breath: Secondary | ICD-10-CM | POA: Diagnosis not present

## 2021-03-23 DIAGNOSIS — K2211 Ulcer of esophagus with bleeding: Secondary | ICD-10-CM | POA: Diagnosis not present

## 2021-03-23 DIAGNOSIS — Z881 Allergy status to other antibiotic agents status: Secondary | ICD-10-CM

## 2021-03-23 DIAGNOSIS — Z20822 Contact with and (suspected) exposure to covid-19: Secondary | ICD-10-CM | POA: Diagnosis present

## 2021-03-23 DIAGNOSIS — N1831 Chronic kidney disease, stage 3a: Secondary | ICD-10-CM | POA: Diagnosis present

## 2021-03-23 DIAGNOSIS — R509 Fever, unspecified: Secondary | ICD-10-CM | POA: Diagnosis not present

## 2021-03-23 DIAGNOSIS — J704 Drug-induced interstitial lung disorders, unspecified: Secondary | ICD-10-CM | POA: Diagnosis present

## 2021-03-23 DIAGNOSIS — T380X5A Adverse effect of glucocorticoids and synthetic analogues, initial encounter: Secondary | ICD-10-CM | POA: Diagnosis not present

## 2021-03-23 DIAGNOSIS — M069 Rheumatoid arthritis, unspecified: Secondary | ICD-10-CM | POA: Diagnosis present

## 2021-03-23 DIAGNOSIS — Z96653 Presence of artificial knee joint, bilateral: Secondary | ICD-10-CM | POA: Diagnosis present

## 2021-03-23 DIAGNOSIS — I471 Supraventricular tachycardia: Secondary | ICD-10-CM | POA: Diagnosis present

## 2021-03-23 DIAGNOSIS — Z888 Allergy status to other drugs, medicaments and biological substances status: Secondary | ICD-10-CM

## 2021-03-23 DIAGNOSIS — I330 Acute and subacute infective endocarditis: Secondary | ICD-10-CM | POA: Diagnosis not present

## 2021-03-23 DIAGNOSIS — Z7951 Long term (current) use of inhaled steroids: Secondary | ICD-10-CM

## 2021-03-23 DIAGNOSIS — K254 Chronic or unspecified gastric ulcer with hemorrhage: Secondary | ICD-10-CM | POA: Diagnosis not present

## 2021-03-23 DIAGNOSIS — E785 Hyperlipidemia, unspecified: Secondary | ICD-10-CM | POA: Diagnosis present

## 2021-03-23 DIAGNOSIS — R110 Nausea: Secondary | ICD-10-CM | POA: Diagnosis not present

## 2021-03-23 DIAGNOSIS — J849 Interstitial pulmonary disease, unspecified: Secondary | ICD-10-CM

## 2021-03-23 DIAGNOSIS — I272 Pulmonary hypertension, unspecified: Secondary | ICD-10-CM | POA: Diagnosis present

## 2021-03-23 DIAGNOSIS — J189 Pneumonia, unspecified organism: Secondary | ICD-10-CM

## 2021-03-23 DIAGNOSIS — K2210 Ulcer of esophagus without bleeding: Secondary | ICD-10-CM | POA: Diagnosis not present

## 2021-03-23 DIAGNOSIS — D849 Immunodeficiency, unspecified: Secondary | ICD-10-CM | POA: Diagnosis present

## 2021-03-23 DIAGNOSIS — Z8249 Family history of ischemic heart disease and other diseases of the circulatory system: Secondary | ICD-10-CM

## 2021-03-23 DIAGNOSIS — Z833 Family history of diabetes mellitus: Secondary | ICD-10-CM

## 2021-03-23 DIAGNOSIS — K250 Acute gastric ulcer with hemorrhage: Secondary | ICD-10-CM | POA: Diagnosis not present

## 2021-03-23 DIAGNOSIS — R5381 Other malaise: Secondary | ICD-10-CM | POA: Diagnosis present

## 2021-03-23 DIAGNOSIS — Z794 Long term (current) use of insulin: Secondary | ICD-10-CM | POA: Diagnosis not present

## 2021-03-23 DIAGNOSIS — J9601 Acute respiratory failure with hypoxia: Secondary | ICD-10-CM | POA: Diagnosis present

## 2021-03-23 DIAGNOSIS — Z8711 Personal history of peptic ulcer disease: Secondary | ICD-10-CM

## 2021-03-23 DIAGNOSIS — I25111 Atherosclerotic heart disease of native coronary artery with angina pectoris with documented spasm: Secondary | ICD-10-CM | POA: Diagnosis present

## 2021-03-23 DIAGNOSIS — R531 Weakness: Secondary | ICD-10-CM | POA: Diagnosis not present

## 2021-03-23 DIAGNOSIS — I5030 Unspecified diastolic (congestive) heart failure: Secondary | ICD-10-CM | POA: Diagnosis not present

## 2021-03-23 DIAGNOSIS — Z880 Allergy status to penicillin: Secondary | ICD-10-CM

## 2021-03-23 DIAGNOSIS — I10 Essential (primary) hypertension: Secondary | ICD-10-CM | POA: Diagnosis present

## 2021-03-23 DIAGNOSIS — D62 Acute posthemorrhagic anemia: Secondary | ICD-10-CM | POA: Diagnosis not present

## 2021-03-23 DIAGNOSIS — J679 Hypersensitivity pneumonitis due to unspecified organic dust: Principal | ICD-10-CM | POA: Diagnosis present

## 2021-03-23 DIAGNOSIS — T451X5A Adverse effect of antineoplastic and immunosuppressive drugs, initial encounter: Secondary | ICD-10-CM | POA: Diagnosis present

## 2021-03-23 DIAGNOSIS — M545 Low back pain, unspecified: Secondary | ICD-10-CM | POA: Diagnosis present

## 2021-03-23 DIAGNOSIS — I252 Old myocardial infarction: Secondary | ICD-10-CM

## 2021-03-23 DIAGNOSIS — Z01811 Encounter for preprocedural respiratory examination: Secondary | ICD-10-CM

## 2021-03-23 DIAGNOSIS — Z83438 Family history of other disorder of lipoprotein metabolism and other lipidemia: Secondary | ICD-10-CM

## 2021-03-23 DIAGNOSIS — K259 Gastric ulcer, unspecified as acute or chronic, without hemorrhage or perforation: Secondary | ICD-10-CM | POA: Diagnosis not present

## 2021-03-23 DIAGNOSIS — R61 Generalized hyperhidrosis: Secondary | ICD-10-CM | POA: Diagnosis not present

## 2021-03-23 DIAGNOSIS — D72820 Lymphocytosis (symptomatic): Secondary | ICD-10-CM | POA: Diagnosis present

## 2021-03-23 DIAGNOSIS — K449 Diaphragmatic hernia without obstruction or gangrene: Secondary | ICD-10-CM | POA: Diagnosis present

## 2021-03-23 DIAGNOSIS — J9611 Chronic respiratory failure with hypoxia: Secondary | ICD-10-CM | POA: Diagnosis present

## 2021-03-23 DIAGNOSIS — E1122 Type 2 diabetes mellitus with diabetic chronic kidney disease: Secondary | ICD-10-CM | POA: Diagnosis present

## 2021-03-23 DIAGNOSIS — M0520 Rheumatoid vasculitis with rheumatoid arthritis of unspecified site: Secondary | ICD-10-CM | POA: Diagnosis not present

## 2021-03-23 DIAGNOSIS — G4733 Obstructive sleep apnea (adult) (pediatric): Secondary | ICD-10-CM | POA: Diagnosis present

## 2021-03-23 DIAGNOSIS — D509 Iron deficiency anemia, unspecified: Secondary | ICD-10-CM | POA: Diagnosis present

## 2021-03-23 DIAGNOSIS — I480 Paroxysmal atrial fibrillation: Secondary | ICD-10-CM | POA: Diagnosis not present

## 2021-03-23 DIAGNOSIS — Y929 Unspecified place or not applicable: Secondary | ICD-10-CM

## 2021-03-23 DIAGNOSIS — Z79899 Other long term (current) drug therapy: Secondary | ICD-10-CM

## 2021-03-23 DIAGNOSIS — I342 Nonrheumatic mitral (valve) stenosis: Secondary | ICD-10-CM | POA: Diagnosis not present

## 2021-03-23 DIAGNOSIS — E876 Hypokalemia: Secondary | ICD-10-CM | POA: Diagnosis not present

## 2021-03-23 DIAGNOSIS — Z9189 Other specified personal risk factors, not elsewhere classified: Secondary | ICD-10-CM | POA: Diagnosis not present

## 2021-03-23 DIAGNOSIS — R059 Cough, unspecified: Secondary | ICD-10-CM | POA: Diagnosis not present

## 2021-03-23 DIAGNOSIS — I2510 Atherosclerotic heart disease of native coronary artery without angina pectoris: Secondary | ICD-10-CM | POA: Diagnosis not present

## 2021-03-23 DIAGNOSIS — J302 Other seasonal allergic rhinitis: Secondary | ICD-10-CM | POA: Diagnosis present

## 2021-03-23 DIAGNOSIS — Z9641 Presence of insulin pump (external) (internal): Secondary | ICD-10-CM | POA: Diagnosis present

## 2021-03-23 DIAGNOSIS — Z87891 Personal history of nicotine dependence: Secondary | ICD-10-CM

## 2021-03-23 DIAGNOSIS — J9690 Respiratory failure, unspecified, unspecified whether with hypoxia or hypercapnia: Secondary | ICD-10-CM | POA: Diagnosis not present

## 2021-03-23 DIAGNOSIS — S83242A Other tear of medial meniscus, current injury, left knee, initial encounter: Secondary | ICD-10-CM | POA: Diagnosis not present

## 2021-03-23 DIAGNOSIS — R0902 Hypoxemia: Secondary | ICD-10-CM

## 2021-03-23 HISTORY — DX: Ventricular premature depolarization: I49.3

## 2021-03-23 HISTORY — DX: Atrial premature depolarization: I49.1

## 2021-03-23 HISTORY — DX: Other supraventricular tachycardia: I47.19

## 2021-03-23 HISTORY — DX: Rheumatic mitral stenosis: I05.0

## 2021-03-23 HISTORY — DX: Supraventricular tachycardia: I47.1

## 2021-03-23 HISTORY — DX: Angina pectoris with documented spasm: I20.1

## 2021-03-23 LAB — CBC WITH DIFFERENTIAL/PLATELET
Abs Immature Granulocytes: 0.09 10*3/uL — ABNORMAL HIGH (ref 0.00–0.07)
Basophils Absolute: 0.1 10*3/uL (ref 0.0–0.1)
Basophils Relative: 1 %
Eosinophils Absolute: 0.3 10*3/uL (ref 0.0–0.5)
Eosinophils Relative: 4 %
HCT: 32.1 % — ABNORMAL LOW (ref 36.0–46.0)
Hemoglobin: 9.3 g/dL — ABNORMAL LOW (ref 12.0–15.0)
Immature Granulocytes: 1 %
Lymphocytes Relative: 15 %
Lymphs Abs: 1.3 10*3/uL (ref 0.7–4.0)
MCH: 18.4 pg — ABNORMAL LOW (ref 26.0–34.0)
MCHC: 29 g/dL — ABNORMAL LOW (ref 30.0–36.0)
MCV: 63.6 fL — ABNORMAL LOW (ref 80.0–100.0)
Monocytes Absolute: 0.9 10*3/uL (ref 0.1–1.0)
Monocytes Relative: 10 %
Neutro Abs: 6 10*3/uL (ref 1.7–7.7)
Neutrophils Relative %: 69 %
Platelets: 368 10*3/uL (ref 150–400)
RBC: 5.05 MIL/uL (ref 3.87–5.11)
RDW: 18.7 % — ABNORMAL HIGH (ref 11.5–15.5)
WBC: 8.6 10*3/uL (ref 4.0–10.5)
nRBC: 0 % (ref 0.0–0.2)

## 2021-03-23 LAB — HEPATIC FUNCTION PANEL
ALT: 28 U/L (ref 0–44)
AST: 23 U/L (ref 15–41)
Albumin: 3.5 g/dL (ref 3.5–5.0)
Alkaline Phosphatase: 69 U/L (ref 38–126)
Bilirubin, Direct: 0.1 mg/dL (ref 0.0–0.2)
Indirect Bilirubin: 0.3 mg/dL (ref 0.3–0.9)
Total Bilirubin: 0.4 mg/dL (ref 0.3–1.2)
Total Protein: 7.4 g/dL (ref 6.5–8.1)

## 2021-03-23 LAB — URINALYSIS, ROUTINE W REFLEX MICROSCOPIC
Bilirubin Urine: NEGATIVE
Glucose, UA: NEGATIVE mg/dL
Ketones, ur: NEGATIVE mg/dL
Nitrite: NEGATIVE
Protein, ur: NEGATIVE mg/dL
Specific Gravity, Urine: 1.017 (ref 1.005–1.030)
pH: 5 (ref 5.0–8.0)

## 2021-03-23 LAB — BASIC METABOLIC PANEL
Anion gap: 8 (ref 5–15)
BUN: 14 mg/dL (ref 8–23)
CO2: 27 mmol/L (ref 22–32)
Calcium: 9 mg/dL (ref 8.9–10.3)
Chloride: 102 mmol/L (ref 98–111)
Creatinine, Ser: 1.05 mg/dL — ABNORMAL HIGH (ref 0.44–1.00)
GFR, Estimated: 58 mL/min — ABNORMAL LOW (ref >60)
Glucose, Bld: 115 mg/dL — ABNORMAL HIGH (ref 70–99)
Potassium: 3.3 mmol/L — ABNORMAL LOW (ref 3.5–5.1)
Sodium: 137 mmol/L (ref 135–145)

## 2021-03-23 LAB — RESP PANEL BY RT-PCR (FLU A&B, COVID) ARPGX2
Influenza A by PCR: NEGATIVE
Influenza B by PCR: NEGATIVE
SARS Coronavirus 2 by RT PCR: NEGATIVE

## 2021-03-23 MED ORDER — ACETAMINOPHEN 650 MG RE SUPP: 650.0000 mg | Freq: Four times a day (QID) | RECTAL | Status: DC | PRN

## 2021-03-23 MED ORDER — ACETAMINOPHEN 325 MG PO TABS
650.0000 mg | ORAL_TABLET | Freq: Four times a day (QID) | ORAL | Status: DC | PRN
  Administered 2021-03-25 – 2021-04-02 (×6): 650 mg via ORAL
  Filled 2021-03-23 (×7): qty 2

## 2021-03-23 MED ORDER — POTASSIUM CHLORIDE CRYS ER 20 MEQ PO TBCR
40.0000 meq | EXTENDED_RELEASE_TABLET | Freq: Once | ORAL | Status: AC
  Administered 2021-03-23: 40 meq via ORAL
  Filled 2021-03-23: qty 2

## 2021-03-23 MED ORDER — ALBUTEROL SULFATE (2.5 MG/3ML) 0.083% IN NEBU: 2.5000 mg | INHALATION_SOLUTION | RESPIRATORY_TRACT | Status: DC | PRN

## 2021-03-23 NOTE — ED Notes (Signed)
Ambulated pt to the bathroom and measured oxygen saturation levels. Pt stayed above 91% for the majority of the walk, with one short drop to 86% from which she quickly recovered. Pt's breathing was labored and HR was elevated from 105-110 BPM. MD notified.

## 2021-03-23 NOTE — ED Triage Notes (Signed)
Writer was in the ED Lobby and observed the patient at the vending machines eating chips and had another bag in her lap and drinking water from the drink machine.

## 2021-03-23 NOTE — ED Triage Notes (Signed)
Patient c/o fever, cough, fatigue, and nausea x 3 weeks. Patient states her last covid test was 8 days ago and it was negative( home test)  Sats 89% in triage. Patient placed on O2 2L/min via [place] sats 96%

## 2021-03-23 NOTE — H&P (Signed)
History and Physical    PLEASE NOTE THAT [employer]AGON [employer] SOFTWARE WAS USED IN THE CONSTRUCTION OF THIS NOTE.   Lynn [name] FBP:[id number] DOB: Jun 28, 1956 DOA: 03/23/2021  PCP: [name], MD Patient coming from: home   I have personally briefly reviewed patient's old medical records in [place]  Chief Complaint: Fever  HPI: Lynn [name] is a 65 y.o. female with medical history significant for rheumatoid arthritis on methotrexate, chronic diastolic heart failure, hypertension, hyperlipidemia, chronic microcytic anemia in the setting of thalassemia with baseline hemoglobin 9-10, type 2 diabetes mellitus, who is admitted to [facility] on 03/23/2021 with acute hypoxic respiratory distress in the setting of suspected acute bronchitis after presenting from home to [facility] ED complaining of fever.   The patient reports that she has been experiencing near daily fevers over the course of the last 3 weeks, with fevers of the subjective nature over the first 2 weeks of that timeframe, followed by development of objective fever over the last 1 week, with evening temperatures typically running between 10 1-1 02 F over that time.  She reports 3 weeks of associated shortness of breath associated with cough, which was initially productive, but is now transitioned into a dry, nonproductive cough in the absence of any hemoptysis.  She notes intermittent wheezing that has been improving over the last several days.  She reports preceding and ongoing rhinitis/rhinorrhea associated with postnasal drip in the setting of a history of allergic rhinitis as a result of seasonal allergies.   She also notes intermittent nausea in the absence of any vomiting over that timeframe, and denies any associated diarrhea or rash.  She reports some mild right upper quadrant abdominal discomfort approximately 2 weeks ago, but notes that this is subsequently completely resolved, without return of  this symptom.  She reports that her recent shortness of breath has not been associate with any orthopnea, PND, or new onset peripheral edema.  She reports bilateral anterior chest wall discomfort with coughing, which started a few weeks into her shortness of breath/cough, as opposed to representing one of the initial symptoms and the sequelae.  At this anterior chest wall discomfort is nonexertional, but at times seems to worsen when laying on her left side denies any associated diaphoresis, palpitations, presyncope, or syncope.  No new lower extremity erythema or calf tenderness.  Denies any recent melena or hematochezia.  Denies any recent neck stiffness, and denies any recent known COVID-19 exposures.  Denies any associated dysuria, gross hematuria, or change in urinary urgency/frequency.  She reports that she flew to and from [place] approximately 4 to 6 weeks ago.  Additionally, she reports that approximately 5 weeks ago that she received the COVID-19 booster vaccine, followed by the shingles vaccine 1 week later, which she reports corresponded to a few days prior to first noting development of her recurrent fever.  Aside from this trip to [place], she denies any recent traveling, including no traveling to areas associated with malarial exposure.  She acknowledges a history of bilateral total knee arthroplasty, with right TKA having been performed in May 2016, while left TKA was performed in November 2016.  She denies any recent worsening of discomfort associate with the bilateral knees, and denies any associated acute swelling or erythema.  The patient also acknowledges a history of rheumatoid arthritis for which she has been on q. weekly methotrexate injections for over 1 year, and specifically denies any recent dose adjustments to this medication.  In the  setting of the above symptoms, the patient presented to her PCP on 03/20/2021, at which time evaluation was notable for the following:  BNP 57, D-dimer 0.55, and TSH 4.0.  Additionally, she underwent CTA chest as an outpatient on 03/21/2021, which was notable for showing no evidence of acute pulmonary embolism, while showing scattered groundglass opacities consistent with atelectasis, in the absence of any additional acute cardiopulmonary process, including no evidence of overt infiltrate, edema, pleural effusion, or pneumothorax.  Per chart review and per my discussions with the patient this evening, her PCP suspected acute bronchitis and started the patient on doxycycline, with first dose taken on 03/21/2021.  She was also prescribed a Flovent inhaler, but this has not yet been available at the pharmacy, therefore she has not started this medication.  In the setting of persistence of her fevers, without significant improvement in her shortness of breath, the patient presents to [facility] long emergency department this evening for further evaluation management of the above.  She denies any baseline supplemental oxygen requirements, and denies any known chronic underlying pulmonary pathology.  She reports that she is a former smoker, having completely quit smoking approximately 6 years ago after an approximate 10-pack-year history of cigarette smoking.    ED Course:  Vital signs in the ED were notable for the following: Temperature max 99.0, heart rate 71-88; blood pressure 147/63 -173/82; respiratory rate 18-22, oxygen saturation 89% on room air, which is increased to 93 to 94% on 2 L nasal cannula.  Labs were notable for the following: CMP was notable for the following: Sodium 137, potassium 3.3, bicarbonate 27, creatinine 1.05 relative to most recent prior value of 1.19 on 03/21/2021, glucose 115, and liver enzymes were found to be within normal limits.  CBC notable for white blood cell count of 8600, hemoglobin 9.3 relative to most recent prior value of 9.5 on 03/20/2021.  Urinalysis showed 6-10 white blood cells, rare bacteria, was nitrate  negative.  Nasopharyngeal COVID-19/influenza PCR were performed in the ED this evening and found to be negative.  Chest x-ray showed no evidence of acute cardiopulmonary process, including no evidence of infiltrate, edema, effusion, or pneumothorax.  CT abdomen/pelvis showed no evidence of acute intra-abdominal process.  While in the ED, the following were administered: Potassium chloride 40 mill equivalents p.o. x1.  Socially, the patient was admitted for further evaluation and management of presenting acute hypoxic respiratory distress in the setting of suspected acute bronchitis as well as for further evaluation of fever of unclear origin.    Review of Systems: As per HPI otherwise 10 point review of systems negative.   Past Medical History:  Diagnosis Date   Anginal pain ([place])    Anxiety    Arthritis    "knees, wrists, back, elbows" (07/03/2017)   Clotting disorder ([place])    blood clot in eye 2016   Coronary artery disease, non-occlusive    a. 11/2016 NSTEMI/Cath: LM nl, LAD 40p, 1m, D1/2 small, LCX 40ost, OM2/3 nl/small, RCA 35 ost/mid, RPDA/RPL small/nl.   Depression    Diastolic dysfunction    a. 11/2016 Echo: EF 55-60%, gr1 DD, sev calcified MV annulus, mildly dil LA.   Eye hemorrhage 01/2015   "right; resolved" (07/03/2017)   Headache    "monthly" (07/03/2017)   Heart murmur    History of blood transfusion    "when I had an ectopic pregnancy"   History of stomach ulcers    Hyperlipidemia    Hypertension    Microcytic anemia  OSA on CPAP    setting is unknown   Pneumonia    "several times" (07/03/2017)   Syncope 07/03/2017 X 2   called seizures but no medications   Thalassemia    "my cells are sickle cell shaped but I don't have sickle cell anemia" (07/03/2017)   Type II diabetes mellitus ([place])     Past Surgical History:  Procedure Laterality Date   48-Hour Monitor  03/18/2017   Sinus rhythm with sinus tachycardia (rate 58-134 BPM)multiple PVCs noted with couplets  and bigeminy. One triplet. 7 runs of PAT ranging from 100-130 bpm. Longest was 33 beats.   BREAST BIOPSY Left    CARDIAC CATHETERIZATION N/A 11/19/2016   Procedure: Left Heart Cath and Coronary Angiography;  Surgeon: Belva Crome, MD;  Location: [place] [facility];  Service: Cardiovascular.    LM nl, LAD 40p, 68m, D1/2 small, LCX 40ost, OM2/3 nl/small, RCA 35 ost/mid, RPDA/RPL small/nl.   CARPAL TUNNEL RELEASE Right 11/01/2014   COLONOSCOPY     DILATION AND CURETTAGE OF UTERUS     ECTOPIC PREGNANCY SURGERY  X 2   JOINT REPLACEMENT     KNEE ARTHROSCOPY Left 11/2014   TONSILLECTOMY     TOTAL KNEE ARTHROPLASTY Right 02/18/2015   Procedure: RIGHT TOTAL KNEE ARTHROPLASTY;  Surgeon: SNetta Cedars MD;  [facility]  Service: Orthopedics;  Laterality: Right;   TOTAL KNEE ARTHROPLASTY Left 08/19/2015   Procedure: LEFT TOTAL KNEE ARTHROPLASTY;  Surgeon: SNetta Cedars MD;  [facility]  Service: Orthopedics;  Laterality: Left;   TRANSTHORACIC ECHOCARDIOGRAM  11/2016; 07/2017:   a) normal LV size, thickness and function. EF 55-60%. GR 1 DD.No RWMA severe mitral annular calcification, but no mitral stenosis. Mild LA dilation;; b) normal LV size and function.  EF 65-75%.  Unable to assess diastolic function.  Aortic sclerosis with no stenosis.  Moderate mitral stenosis? (Gradient 9 mmHg) -?  Mild-mod left atrial enlargement    TRANSTHORACIC ECHOCARDIOGRAM  01/2019   EF 65 to 70%.  Normal function.  Elevated LVEDP-GR 1 DD.  Normal RV size and function.  Large calcific mass on posterior mitral leaflet mild to moderate mitral stenosis.   VAGINAL HYSTERECTOMY      Social History:  reports that she quit smoking about 6 years ago. Her smoking use included cigarettes. She has a 11.00 pack-year smoking history. She has never used smokeless tobacco. She reports current alcohol use. She reports that she does not use drugs.   Allergies  Allergen Reactions   Penicillins Hives    Childhood allergy Has  patient had a PCN reaction causing immediate rash, facial/tongue/throat swelling, SOB or lightheadedness with hypotension: Yes Has patient had a PCN reaction causing severe rash involving mucus membranes or skin necrosis: No Has patient had a PCN reaction that required hospitalization No Has patient had a PCN reaction occurring within the last 10 years: No If all of the above answers are "NO", then may proceed with Cephalosporin use.   FWilder Glade[Dapagliflozin] Other (See Comments)    Dizzy and lethargic    Metformin And Related     Diarrhea, bleeding    Family History  Problem Relation Age of Onset   Cancer Mother    Diabetes Mother    Hypertension Mother    Hyperlipidemia Mother    Cancer Father    Hyperlipidemia Father    Mental illness Sister    Diabetes Sister    Diabetes Maternal Grandmother    Diabetes Maternal Grandfather  Colon cancer Neg Hx    Esophageal cancer Neg Hx    Stomach cancer Neg Hx    Rectal cancer Neg Hx     Family history reviewed and not pertinent    Prior to Admission medications   Medication Sig Start Date End Date Taking? Authorizing Provider  atorvastatin (LIPITOR) 80 MG tablet TAKE 1 TABLET(80 MG) BY MOUTH DAILY AT 6 PM 02/20/21   Copland, Gay Filler, MD  cetirizine (ZYRTEC) 10 MG tablet Take 10 mg by mouth daily as needed for allergies.    [provider]  Continuous Blood Gluc Sensor (DEXCOM G6 SENSOR) MISC 1 Device by Does not apply route as directed. 06/17/20   [name], Melanie Crazier, MD  Continuous Blood Gluc Transmit (DEXCOM G6 TRANSMITTER) MISC 1 Device by Does not apply route as directed. 06/17/20   [name], Melanie Crazier, MD  diltiazem (CARDIZEM CD) 240 MG 24 hr capsule Take 1 capsule (240 mg total) by mouth daily. 08/09/20 11/07/20  Leonie Man, MD  doxycycline (VIBRAMYCIN) 100 MG capsule Take 1 capsule (100 mg total) by mouth 2 (two) times daily. Patient taking differently: Take 100 mg by mouth 2 (two) times daily. Start  date : 03/20/21 03/20/21   Copland, Gay Filler, MD  fluticasone (FLOVENT DISKUS) 50 MCG/BLIST diskus inhaler Inhale 1 puff into the lungs 2 (two) times daily. 03/17/21   Hazel Sams, PA-C  folic acid (FOLVITE) 1 MG tablet Take 2 tablets (2 mg total) by mouth daily. 01/21/20   Copland, Gay Filler, MD  glucose blood (CONTOUR NEXT TEST) test strip 3x daily 07/20/19   [name], Melanie Crazier, MD  hydrochlorothiazide (HYDRODIURIL) 12.5 MG tablet Take one by mouth daily 01/21/20   Copland, Gay Filler, MD  Insulin Disposable Pump (OMNIPOD DASH PODS, GEN 4,) MISC  10/03/20   [provider]  insulin lispro (HUMALOG) 100 UNIT/ML injection Inject into the skin. 02/07/21   [provider]  Insulin Pen Needle (B-D UF III MINI PEN NEEDLES) 31G X 5 MM MISC USE DAILY WITH VICTOZA AND LANTUS. 07/20/19   [name], Melanie Crazier, MD  Insulin Pen Needle 31G X 5 MM MISC 1 Device by Does not apply route 3 (three) times daily. 01/21/20   Copland, Gay Filler, MD  isosorbide mononitrate (IMDUR) 30 MG 24 hr tablet Take 30 mg  tablet as needed for Chest pain spasm episodes for 3 days and the stop Patient taking differently: Take 30 mg  tablet as needed for Chest pain spasm episodes for 3 days and the stop 08/09/20   Leonie Man, MD  Lancets 30G MISC 1 Device by Does not apply route 3 (three) times daily. 07/16/19   Copland, Gay Filler, MD  losartan (COZAAR) 100 MG tablet Take 1 tablet (100 mg total) by mouth daily. 08/09/20 11/07/20  Leonie Man, MD  methotrexate 50 MG/2ML injection  08/11/19   [provider]  Multiple Vitamin (MULTIVITAMIN WITH MINERALS) TABS tablet Take 1 tablet by mouth daily. Reported on 01/03/2016    [provider]  SAFETY-LOK TB SYRINGE 27GX.5" 27G X 1/2" 1 ML MISC  08/18/19   [provider]  VICTOZA 18 MG/3ML SOPN INJECT 1.8 MG UNDER THE SKIN ONCE DAILY 02/20/21   Copland, Gay Filler, MD  Zoster Vaccine Adjuvanted [facility]) injection Inject into the  muscle. 02/20/21   Carlyle Basques, MD     Objective    Physical Exam: Vitals:   03/23/21 2000 03/23/21 2015 03/23/21 2030 03/23/21 2100  BP: Marland Kitchen)  162/91   (!) 168/83  Pulse: 78 83 76 87  Resp: (!) 21   20  Temp:      TempSrc:      SpO2: 94% 93% 93% 93%  Weight:      Height:        General: appears to be stated age; alert, oriented Skin: warm, dry, no rash Head:  AT/[place] Mouth:  Oral mucosa membranes appear moist, normal dentition Neck: supple; trachea midline Heart:  RRR; did not appreciate any M/R/G Lungs: CTAB, did not appreciate any wheezes, rales, or rhonchi Abdomen: + BS; soft, ND, NT Vascular: 2+ pedal pulses b/l; 2+ radial pulses b/l Extremities: no peripheral edema, no muscle wasting Neuro: strength and sensation intact in upper and lower extremities b/l   Labs on Admission: I have personally reviewed following labs and imaging studies  CBC: Recent Labs  Lab 03/20/21 1220 03/23/21 1301  WBC 7.6 8.6  NEUTROABS  --  6.0  HGB 9.5* 9.3*  HCT 30.6* 32.1*  MCV 60.5 Repeated and verified X2.* 63.6*  PLT 353.0 540   Basic Metabolic Panel: Recent Labs  Lab 03/20/21 1220 03/23/21 1301  NA 137 137  K 4.0 3.3*  CL 99 102  CO2 26 27  GLUCOSE 135* 115*  BUN 15 14  CREATININE 1.19 1.05*  CALCIUM 9.5 9.0   GFR: Estimated Creatinine Clearance: 61.6 mL/min (A) (by C-G formula based on SCr of 1.05 mg/dL (H)). Liver Function Tests: Recent Labs  Lab 03/20/21 1220 03/23/21 1301  AST 19 23  ALT 28 28  ALKPHOS 80 69  BILITOT 0.5 0.4  PROT 7.2 7.4  ALBUMIN 4.1 3.5   No results for input(s): LIPASE, AMYLASE in the last 168 hours. No results for input(s): AMMONIA in the last 168 hours. Coagulation Profile: No results for input(s): INR, PROTIME in the last 168 hours. Cardiac Enzymes: No results for input(s): CKTOTAL, CKMB, CKMBINDEX, TROPONINI in the last 168 hours. BNP (last 3 results) Recent Labs    03/20/21 1220  PROBNP 57.0   HbA1C: No results for  input(s): HGBA1C in the last 72 hours. CBG: No results for input(s): GLUCAP in the last 168 hours. Lipid Profile: No results for input(s): CHOL, HDL, LDLCALC, TRIG, CHOLHDL, LDLDIRECT in the last 72 hours. Thyroid Function Tests: No results for input(s): TSH, T4TOTAL, FREET4, T3FREE, THYROIDAB in the last 72 hours. Anemia Panel: No results for input(s): VITAMINB12, FOLATE, FERRITIN, TIBC, IRON, RETICCTPCT in the last 72 hours. Urine analysis:    Component Value Date/Time   COLORURINE YELLOW 03/23/2021 1732   APPEARANCEUR HAZY (A) 03/23/2021 1732   LABSPEC 1.017 03/23/2021 1732   PHURINE 5.0 03/23/2021 1732   GLUCOSEU NEGATIVE 03/23/2021 1732   HGBUR SMALL (A) 03/23/2021 1732   BILIRUBINUR NEGATIVE 03/23/2021 1732   KETONESUR NEGATIVE 03/23/2021 1732   PROTEINUR NEGATIVE 03/23/2021 1732   UROBILINOGEN 0.2 08/18/2008 1535   NITRITE NEGATIVE 03/23/2021 1732   LEUKOCYTESUR TRACE (A) 03/23/2021 1732    Radiological Exams on Admission: CT Abdomen Pelvis Wo Contrast  Result Date: 03/23/2021 CLINICAL DATA:  Cough, nausea for 3 weeks.  Abdomen pain and fever. EXAM: CT ABDOMEN AND PELVIS WITHOUT CONTRAST TECHNIQUE: Multidetector CT imaging of the abdomen and pelvis was performed following the standard protocol without IV contrast. COMPARISON:  None. FINDINGS: Lower chest: No acute abnormality. Dense calcification of mitral valvular ring is identified. Hepatobiliary: There is mild diffuse low density of the liver. No focal lesion is identified in the liver. The gallbladder and  biliary tree are normal. Pancreas: Unremarkable. No pancreatic ductal dilatation or surrounding inflammatory changes. Spleen: Normal in size without focal abnormality. Adrenals/Urinary Tract: Adrenal glands are unremarkable. Kidneys are normal, without renal calculi, focal lesion, or hydronephrosis. Bladder is unremarkable. Stomach/Bowel: Stomach is within normal limits. The appendix is not definitely seen but no inflammation  is noted around cecum. No evidence of bowel wall thickening, distention, or inflammatory changes. There is diverticulosis of colon. Vascular/Lymphatic: Aortic atherosclerosis. No enlarged abdominal or pelvic lymph nodes. Reproductive: Status post hysterectomy. No adnexal masses. Other: Small umbilical herniation of mesenteric fat is noted. Musculoskeletal: Degenerative joint changes of the spine are identified. IMPRESSION: 1. No acute abnormality identified in the abdomen and pelvis. 2. Fatty infiltration of liver. 3. Diverticulosis of colon without diverticulitis. 4. Aortic atherosclerosis. Aortic Atherosclerosis (ICD10-I70.0). Electronically Signed   By: Abelardo Diesel M.D.   On: 03/23/2021 18:47   DG Chest 2 View  Result Date: 03/23/2021 CLINICAL DATA:  Cough, shortness of breath for several weeks. EXAM: CHEST - 2 VIEW COMPARISON:  March 17, 2021 FINDINGS: The heart size and mediastinal contours are stable. Mitral annular calcification is unchanged. Both lungs are clear. The visualized skeletal structures are unremarkable. IMPRESSION: No active cardiopulmonary disease. Electronically Signed   By: Abelardo Diesel M.D.   On: 03/23/2021 14:16      Assessment/Plan   Lynn [name] is a 65 y.o. female with medical history significant for rheumatoid arthritis on methotrexate, chronic diastolic heart failure, hypertension, hyperlipidemia, chronic microcytic anemia in the setting of thalassemia with baseline hemoglobin 9-10, type 2 diabetes mellitus, who is admitted to [facility] on 03/23/2021 with acute hypoxic respiratory distress in the setting of suspected acute bronchitis after presenting from home to [facility] ED complaining of fever.    Principal Problem:   Acute respiratory failure with hypoxia (HCC) Active Problems:   Essential hypertension   Chronic diastolic CHF (congestive heart failure) (HCC)   Type 2 diabetes mellitus with hyperglycemia, with long-term current use of insulin (HCC)    Acute bronchitis   SOB (shortness of breath)   Fever   Hypokalemia   Generalized weakness     #) Acute hypoxic respiratory distress due to acute bronchitis: In the setting of no known baseline supplemental oxygen requirements, patient presents with 3 weeks of shortness of breath and initial O2 saturations in the high 80s on room air, which is subsequently improved into the mid 90s on 2 L nasal cannula, thereby meeting definition for acute hypoxic respiratory distress as opposed to failure.  Suspect contribution from acute bronchitis in the setting of concomitant shortness of breath, cough in the context of no known history of underlying COPD or asthma, with CTA chest performed on 03/21/2021 showing no evidence of acute pulmonary embolism, pneumonia, edema, effusion, or pneumothorax.  Clinically and radiographically, presentation does not appear consistent with acutely decompensated heart failure.  Presentation also is less suggestive of ACS, in the absence of any typical chest pain.  We will check EKG for further evaluation.  Of note, in the setting of suspected acute bronchitis, preceding rhinitis/rhinorrhea associated with postnasal drip is suggestive of underlying viral etiology.  Differential also includes reactive airway disease given this presentation.  We will initiate scheduled duo nebulizer treatments as well as as needed albuterol nebulizer.  In the setting of recent wheezing suggestive of an element of bronchospasm, discussed with the patient the possibility of a brief course of steroids.  However, following this discussion, the patient requested to refrain  from initiation of systemic corticosteroids given that her wheezing has been improving over the last several days and given that the patient has concerns over her glycemic management on steroids in the setting of type 2 diabetes mellitus.  Of note, in the absence of leukocytosis or current objective fever, criteria for sepsis are not met at this  time, although we will closely monitor for ensuing development of objective fever.  Plan: Monitor on telemetry.  Monitor on continuous pulse oximetry.  Scheduled duo nebulizer treatments.  As needed albuterol nebulizers.  Add on serum magnesium level.  Check serum phosphorus level.  Add on procalcitonin.  Check VBG.  Flutter valve and incentive spirometry has been ordered.  Per my discussions with the patient's evening, will refrain from course of systemic corticosteroids, as above.  In the context of persistent fever over the last 3 weeks, will also check echocardiogram, as further detailed below.  Check EKG.      #) Fever: The patient reports 3 weeks of fever, initially subjective, before transitioning into objective fevers with associated temperatures of 10 1-1 02 over the course of the last week.  Unclear underlying etiology.  Aside from potential acute bronchitis of possible viral etiology, no additional source of underlying infection has been identified at this time, including CTA chest as well as chest x-ray showing no evidence of pneumonia, while nasopharyngeal COVID-19/influenza PCR performed this evening were found to be negative, urinalysis is not suggestive of underlying UTI.  No meningismus symptoms to suggest meningitis.  No evidence to suggest septic joint in the context of a documented history of bilateral TKAs.  Additionally, CTA chest showed no evidence of acute pulmonary embolism.  We will check echocardiogram and blood cultures as well as ESR and CRP to further evaluate the above.  Differential includes side effect of methotrexate, as this medication is potential of causing objective fevers, although this appears slightly less likely given no recent or preceding dose increases leading up to onset of fever.  Additionally, she denies any recent RA flares. differential also includes down-regulation of immune system associate with inflammatory response following COVID-19 vaccine followed by  shingles vaccine 1 week later, which preceded onset of a 3-week course of fevers by 3 to 4 days.  No known underlying source of malignancy.  Of note, TSH was checked recently as an outpatient found to be nonelevated.   Plan: Check blood cultures x2.  Echocardiogram ordered for the morning, as above.  Check ESR and CRP.  Add on procalcitonin level.  Repeat CBC in the morning.  As needed acetaminophen for fever.  Further evaluation and management of suspected acute bronchitis, as above.  Repeat CMP in the morning.       #) Hypokalemia: Presenting serum potassium mildly low at 3.3.  She received potassium chloride 40 mEq p.o. x1 in the ED.  Plan: Add on serum magnesium level.  Repeat CMP in the morning.      #) Generalized weakness: Reports associated generalized weakness over the last few weeks in the absence of any acute focal neurologic deficits to suggest acute stroke.  Suspect contribution from physiologic stress relating to recent febrile course, as above.  As noted above, TSH recently checked as an outpatient and found to be nonelevated.     Plan: further evaluation and management of acute hypoxic respiratory distress as well as fever, as above.  Physical therapy consult has been placed.  Add on MMA.  Repeat CBC and CMP in the morning.       #)  Chronic diastolic heart failure: Most recent echocardiogram occurred in April 2021 and was associated with LVEF 65 to 70%, no focal wall motion dramality's, moderate LVH, grade 1 diastolic dysfunction, and mild to moderate mitral stenosis.  She is not on any scheduled diuretic medications at home.  No clinical or radiographic evidence to suggest acutely decompensated heart failure, including recent nonelevated BNP when checked as an outpatient.  Plan: Monitor strict I's and O's and daily weights.  Add on serum magnesium level.  Repeat CMP in the morning.       #) Essential hypertension: On losartan and diltiazem as an outpatient.   Systolic blood pressure has been in the range of the 140s up to the 170s, in the context of missing her home doses of antihypertensive medications earlier today.  Will resume home losartan, but hold home diltiazem for now in order to more accurately trend/monitor heart rate as a potential clinical indicator of underlying inflammatory/infectious process.   Plan: Resume home losartan.  Hold home diltiazem for now.  Monitor on telemetry.  Close monitoring of ensuing blood pressures via routine vital signs.        #) Type 2 diabetes mellitus: On insulin pump as an outpatient.  Presenting blood sugar noted to be 115.  She is also on Victoza at home.  Plan: Per my discussions with the patient this evening, will hold home insulin pump for now.  Accu-Cheks before every meal and at bedtime with low-dose sliding scale insulin.  Hold home Victoza for now.        #) Chronic microcytic anemia due to thalassemia: Documented history of such, with baseline hemoglobin noted to be 9-10, with presenting CBC reflecting hemoglobin level consistent with his baseline range.   Plan: Repeat CBC in the morning.       DVT prophylaxis: scd's  Code Status: Full code Family Communication: none Disposition Plan: Per Rounding Team Consults called: none  Admission status: Inpatient; med telemetry     Of note, this patient was added by me to the following Admit List/Treatment Team: [email or web address].      PLEASE NOTE THAT DRAGON DICTATION SOFTWARE WAS USED IN THE CONSTRUCTION OF THIS NOTE.   [facility] Pager [phone] From [place]  Otherwise, please contact night-coverage  [email or web address] Password [email or web address]   03/23/2021, 9:52 PM

## 2021-03-23 NOTE — ED Provider Notes (Signed)
[facility] DEPT Provider Note   CSN: [id number] Arrival date & time: 03/23/21  1216     History Chief Complaint  Patient presents with   Fever   Cough   Nausea   Fatigue    Cambryn Charters Deangelo is a 65 y.o. female.  The history is provided by the patient and medical records. No language interpreter was used.  Fever Associated symptoms: cough   Cough Associated symptoms: fever   Shealeigh Dunstan is a 65 y.o. female who presents to the Emergency Department complaining of multiple complaints.  She complains of three weeks of nausea, fever to 99.  Over the last week she has had fever to 101 or 102 at night.  She has associated dizziness, sob.  Checks her oxygen at home - ranges from 88-93.   Had some dull RLQ pain over the last two weeks - now gone.  Had headaches. Has mild occasional chest pain.  Has cough and sob.  Cough was initially productive of cloudy white sputum - no hemoptysis.  Just had a CTA from PCP's office  on 6/7 - negative for PE or pneumonia.       Past Medical History:  Diagnosis Date   Anginal pain ([place])    Anxiety    Arthritis    "knees, wrists, back, elbows" (07/03/2017)   Clotting disorder ([place])    blood clot in eye 2016   Coronary artery disease, non-occlusive    a. 11/2016 NSTEMI/Cath: LM nl, LAD 40p, 30md, D1/2 small, LCX 40ost, OM2/3 nl/small, RCA 35 ost/mid, RPDA/RPL small/nl.   Depression    Diastolic dysfunction    a. 11/2016 Echo: EF 55-60%, gr1 DD, sev calcified MV annulus, mildly dil LA.   Eye hemorrhage 01/2015   "right; resolved" (07/03/2017)   Headache    "monthly" (07/03/2017)   Heart murmur    History of blood transfusion    "when I had an ectopic pregnancy"   History of stomach ulcers    Hyperlipidemia    Hypertension    Microcytic anemia    OSA on CPAP    setting is unknown   Pneumonia    "several times" (07/03/2017)   Syncope 07/03/2017 X 2   called seizures but no medications    Thalassemia    "my cells are sickle cell shaped but I don't have sickle cell anemia" (07/03/2017)   Type II diabetes mellitus ([place])     Patient Active Problem List   Diagnosis Date Noted   Coronary artery spasm ([place]) 08/09/2020   Rheumatoid arthritis ([place]) 11/09/2019   Toe pain, bilateral 05/26/2019   Type 2 diabetes mellitus with hyperglycemia, with long-term current use of insulin ([place]) 05/26/2019   Mobility impaired 04/01/2019   Morbid obesity due to excess calories ([place]) 12/03/2017   Dyspnea on exertion 12/02/2017   Mitral stenosis 01/60/1093   Diastolic heart failure ([place]) 08/12/2017   Acute respiratory failure with hypoxia ([place]) 08/12/2017   OSA on CPAP 08/12/2017   COPD with acute bronchitis ([place]) 08/12/2017   Near syncope 07/03/2017   Anxiety 07/03/2017   Coronary artery disease, non-occlusive 12/08/2016   PAT (paroxysmal atrial tachycardia) ([place])    Demand myocardial infarction ([place]) 11/16/2016   H/O total knee replacement 08/19/2015   Vision, loss, sudden 03/07/2015   S/P TKR (total knee replacement) using cement 02/18/2015   Thalassemia 09/02/2013   Hyperlipidemia with target low density lipoprotein (LDL) cholesterol less than 100 mg/dL 08/24/2012   Essential hypertension 07/22/2012  Past Surgical History:  Procedure Laterality Date   48-Hour Monitor  03/18/2017   Sinus rhythm with sinus tachycardia (rate 58-134 BPM)multiple PVCs noted with couplets and bigeminy. One triplet. 7 runs of PAT ranging from 100-130 bpm. Longest was 33 beats.   BREAST BIOPSY Left    CARDIAC CATHETERIZATION N/A 11/19/2016   Procedure: Left Heart Cath and Coronary Angiography;  Surgeon: Belva Crome, MD;  Location: [facility];  Service: Cardiovascular.    LM nl, LAD 40p, 24md, D1/2 small, LCX 40ost, OM2/3 nl/small, RCA 35 ost/mid, RPDA/RPL small/nl.   CARPAL TUNNEL RELEASE Right 11/01/2014   COLONOSCOPY     DILATION AND CURETTAGE OF UTERUS     ECTOPIC PREGNANCY SURGERY  X 2    JOINT REPLACEMENT     KNEE ARTHROSCOPY Left 11/2014   TONSILLECTOMY     TOTAL KNEE ARTHROPLASTY Right 02/18/2015   Procedure: RIGHT TOTAL KNEE ARTHROPLASTY;  Surgeon: Netta Cedars, MD;  Location: [facility];  Service: Orthopedics;  Laterality: Right;   TOTAL KNEE ARTHROPLASTY Left 08/19/2015   Procedure: LEFT TOTAL KNEE ARTHROPLASTY;  Surgeon: Netta Cedars, MD;  Location: [facility];  Service: Orthopedics;  Laterality: Left;   TRANSTHORACIC ECHOCARDIOGRAM  11/2016; 07/2017:   a) normal LV size, thickness and function. EF 55-60%. GR 1 DD.No RWMA severe mitral annular calcification, but no mitral stenosis. Mild LA dilation;; b) normal LV size and function.  EF 65-75%.  Unable to assess diastolic function.  Aortic sclerosis with no stenosis.  Moderate mitral stenosis? (Gradient 9 mmHg) -?  Mild-mod left atrial enlargement    TRANSTHORACIC ECHOCARDIOGRAM  01/2019   EF 65 to 70%.  Normal function.  Elevated LVEDP-GR 1 DD.  Normal RV size and function.  Large calcific mass on posterior mitral leaflet mild to moderate mitral stenosis.   VAGINAL HYSTERECTOMY       OB History   No obstetric history on file.     Family History  Problem Relation Age of Onset   Cancer Mother    Diabetes Mother    Hypertension Mother    Hyperlipidemia Mother    Cancer Father    Hyperlipidemia Father    Mental illness Sister    Diabetes Sister    Diabetes Maternal Grandmother    Diabetes Maternal Grandfather    Colon cancer Neg Hx    Esophageal cancer Neg Hx    Stomach cancer Neg Hx    Rectal cancer Neg Hx     Social History   Tobacco Use   Smoking status: Former    Packs/day: 0.25    Years: 44.00    Pack years: 11.00    Types: Cigarettes    Quit date: 02/17/2015    Years since quitting: 6.0   Smokeless tobacco: Never  Vaping Use   Vaping Use: Never used  Substance Use Topics   Alcohol use: Yes    Alcohol/week: 0.0 standard drinks    Comment: 07/03/2017 "might have a few drinks/year"   Drug use: No    Home  Medications Prior to Admission medications   Medication Sig Start Date End Date Taking? Authorizing Provider  atorvastatin (LIPITOR) 80 MG tablet TAKE 1 TABLET(80 MG) BY MOUTH DAILY AT 6 PM 02/20/21   Copland, Gay Filler, MD  cetirizine (ZYRTEC) 10 MG tablet Take 10 mg by mouth daily as needed for allergies.    [provider]  Continuous Blood Gluc Sensor (DEXCOM G6 SENSOR) MISC 1 Device by Does not apply route as directed. 06/17/20  [name], Melanie Crazier, MD  Continuous Blood Gluc Transmit (DEXCOM G6 TRANSMITTER) MISC 1 Device by Does not apply route as directed. 06/17/20   [name], Melanie Crazier, MD  diltiazem (CARDIZEM CD) 240 MG 24 hr capsule Take 1 capsule (240 mg total) by mouth daily. 08/09/20 11/07/20  Leonie Man, MD  doxycycline (VIBRAMYCIN) 100 MG capsule Take 1 capsule (100 mg total) by mouth 2 (two) times daily. 03/20/21   Copland, Gay Filler, MD  fluticasone (FLOVENT DISKUS) 50 MCG/BLIST diskus inhaler Inhale 1 puff into the lungs 2 (two) times daily. 03/17/21   Hazel Sams, PA-C  folic acid (FOLVITE) 1 MG tablet Take 2 tablets (2 mg total) by mouth daily. 01/21/20   Copland, Gay Filler, MD  glucose blood (CONTOUR NEXT TEST) test strip 3x daily 07/20/19   [name], Melanie Crazier, MD  hydrochlorothiazide (HYDRODIURIL) 12.5 MG tablet Take one by mouth daily 01/21/20   Copland, Gay Filler, MD  Insulin Disposable Pump (OMNIPOD DASH PODS, GEN 4,) MISC  10/03/20   [provider]  insulin lispro (HUMALOG) 100 UNIT/ML injection Inject into the skin. 02/07/21   [provider]  Insulin Pen Needle (B-D UF III MINI PEN NEEDLES) 31G X 5 MM MISC USE DAILY WITH VICTOZA AND LANTUS. 07/20/19   [name], Melanie Crazier, MD  Insulin Pen Needle 31G X 5 MM MISC 1 Device by Does not apply route 3 (three) times daily. 01/21/20   Copland, Gay Filler, MD  isosorbide mononitrate (IMDUR) 30 MG 24 hr tablet Take 30 mg  tablet as needed for Chest pain spasm episodes for 3 days and  the stop Patient taking differently: Take 30 mg  tablet as needed for Chest pain spasm episodes for 3 days and the stop 08/09/20   Leonie Man, MD  Lancets 30G MISC 1 Device by Does not apply route 3 (three) times daily. 07/16/19   Copland, Gay Filler, MD  losartan (COZAAR) 100 MG tablet Take 1 tablet (100 mg total) by mouth daily. 08/09/20 11/07/20  Leonie Man, MD  methotrexate 50 MG/2ML injection  08/11/19   [provider]  Multiple Vitamin (MULTIVITAMIN WITH MINERALS) TABS tablet Take 1 tablet by mouth daily. Reported on 01/03/2016    [provider]  SAFETY-LOK TB SYRINGE 27GX.5" 27G X 1/2" 1 ML MISC  08/18/19   [provider]  VICTOZA 18 MG/3ML SOPN INJECT 1.8 MG UNDER THE SKIN ONCE DAILY 02/20/21   Copland, Gay Filler, MD  Zoster Vaccine Adjuvanted [facility]) injection Inject into the muscle. 02/20/21   Carlyle Basques, MD    Allergies    Penicillins and Metformin and related  Review of Systems   Review of Systems  Constitutional:  Positive for fever.  Respiratory:  Positive for cough.   All other systems reviewed and are negative.  Physical Exam Updated Vital Signs BP (!) 157/79 (BP Location: Right Arm)   Pulse 81   Temp 98.3 F (36.8 C) (Oral)   Resp 18   Ht 5\' 4"  (1.626 m)   Wt 105.7 kg   LMP  (LMP Unknown)   SpO2 95%   BMI 39.99 kg/m   Physical Exam Vitals and nursing note reviewed.  Constitutional:      Appearance: She is well-developed.  HENT:     Head: Normocephalic and atraumatic.  Cardiovascular:     Rate and Rhythm: Normal rate and regular rhythm.     Heart sounds: No murmur heard. Pulmonary:     Effort: Pulmonary effort is  normal. No respiratory distress.     Breath sounds: Normal breath sounds.  Abdominal:     Palpations: Abdomen is soft.     Tenderness: There is no abdominal tenderness. There is no guarding or rebound.  Musculoskeletal:        General: No tenderness.  Skin:    General: Skin is warm and dry.   Neurological:     Mental Status: She is alert and oriented to person, place, and time.  Psychiatric:        Behavior: Behavior normal.    ED Results / Procedures / Treatments   Labs (all labs ordered are listed, but only abnormal results are displayed) Labs Reviewed  CBC WITH DIFFERENTIAL/PLATELET - Abnormal; Notable for the following components:      Result Value   Hemoglobin 9.3 (*)    HCT 32.1 (*)    MCV 63.6 (*)    MCH 18.4 (*)    MCHC 29.0 (*)    RDW 18.7 (*)    Abs Immature Granulocytes 0.09 (*)    All other components within normal limits  BASIC METABOLIC PANEL - Abnormal; Notable for the following components:   Potassium 3.3 (*)    Glucose, Bld 115 (*)    Creatinine, Ser 1.05 (*)    GFR, Estimated 58 (*)    All other components within normal limits  RESP PANEL BY RT-PCR (FLU A&B, COVID) [id number]    EKG None  Radiology DG Chest 2 View  Result Date: 03/23/2021 CLINICAL DATA:  Cough, shortness of breath for several weeks. EXAM: CHEST - 2 VIEW COMPARISON:  March 17, 2021 FINDINGS: The heart size and mediastinal contours are stable. Mitral annular calcification is unchanged. Both lungs are clear. The visualized skeletal structures are unremarkable. IMPRESSION: No active cardiopulmonary disease. Electronically Signed   By: Abelardo Diesel M.D.   On: 03/23/2021 14:16    Procedures Procedures   Medications Ordered in ED Medications - No data to display  ED Course  I have reviewed the triage vital signs and the nursing notes.  Pertinent labs & imaging results that were available during my care of the patient were reviewed by me and considered in my medical decision making (see chart for details).    MDM Rules/Calculators/A&P                          patient here for evaluation of progressive nausea, shortness of breath and dyspnea on exertion as well as nightly fevers for the last few weeks. She does have hypoxia on ambulation. She had a recent CTA PE study that was  negative for PE or pneumonia. It did demonstrate ground glass opacities. Labs with stable anemia. Unclear source of fever, hypoxia. Hospitalist consulted for admission for ongoing workup. Final Clinical Impression(s) / ED Diagnoses Final diagnoses:  None    Rx / DC Orders ED Discharge Orders     None        Quintella Reichert, MD 03/24/21 0005

## 2021-03-23 NOTE — ED Provider Notes (Addendum)
Emergency Medicine Provider Triage Evaluation Note  Lynn [name] , a 65 y.o. female  was evaluated in triage.  Pt complains of sob, cough, fatigue ongoing for several weeks. Has had neg covid tests. Denies ble edema.  Had CT PE 2 days ago that was neg for pe  Review of Systems  Positive: Sob, cough, fatigue Negative: Ble edema  Physical Exam  BP (!) 151/64   Pulse 88   Temp 98.3 F (36.8 C) (Oral)   Resp 20   Ht 5\' 4"  (1.626 m)   Wt 105.7 kg   LMP  (LMP Unknown)   SpO2 (!) 89%   BMI 39.99 kg/m  Gen:   Awake, no distress   Resp:  Normal effort  MSK:   Moves extremities without difficulty  Other:  No ble edema, lungs ctab, heart with rrr  Medical Decision Making  Medically screening exam initiated at 12:34 PM.  Appropriate orders placed.  Lynn [name] was informed that the remainder of the evaluation will be completed by another provider, this initial triage assessment does not replace that evaluation, and the importance of remaining in the ED until their evaluation is complete.     Rodney Booze, PA-C 03/23/21 1236    Lynn Pontarelli S, PA-C 03/23/21 1237    Fredia Sorrow, MD 03/29/21 [phone]

## 2021-03-24 ENCOUNTER — Inpatient Hospital Stay (HOSPITAL_COMMUNITY): Payer: Medicare Other

## 2021-03-24 ENCOUNTER — Other Ambulatory Visit: Payer: Self-pay | Admitting: Family Medicine

## 2021-03-24 ENCOUNTER — Encounter (HOSPITAL_COMMUNITY): Payer: Self-pay | Admitting: Internal Medicine

## 2021-03-24 DIAGNOSIS — R509 Fever, unspecified: Secondary | ICD-10-CM

## 2021-03-24 DIAGNOSIS — J209 Acute bronchitis, unspecified: Secondary | ICD-10-CM | POA: Diagnosis present

## 2021-03-24 DIAGNOSIS — E876 Hypokalemia: Secondary | ICD-10-CM | POA: Diagnosis present

## 2021-03-24 DIAGNOSIS — I10 Essential (primary) hypertension: Secondary | ICD-10-CM

## 2021-03-24 DIAGNOSIS — R0602 Shortness of breath: Secondary | ICD-10-CM | POA: Diagnosis present

## 2021-03-24 DIAGNOSIS — R531 Weakness: Secondary | ICD-10-CM

## 2021-03-24 HISTORY — PX: TRANSTHORACIC ECHOCARDIOGRAM: SHX275

## 2021-03-24 LAB — BLOOD GAS, VENOUS
Acid-Base Excess: 1.2 mmol/L (ref 0.0–2.0)
Bicarbonate: 25.6 mmol/L (ref 20.0–28.0)
O2 Saturation: 99 %
Patient temperature: 98.6
pCO2, Ven: 42.5 mmHg — ABNORMAL LOW (ref 44.0–60.0)
pH, Ven: 7.398 (ref 7.250–7.430)
pO2, Ven: 143 mmHg — ABNORMAL HIGH (ref 32.0–45.0)

## 2021-03-24 LAB — CBG MONITORING, ED
Glucose-Capillary: 268 mg/dL — ABNORMAL HIGH (ref 70–99)
Glucose-Capillary: 284 mg/dL — ABNORMAL HIGH (ref 70–99)
Glucose-Capillary: 282 mg/dL — ABNORMAL HIGH (ref 70–99)

## 2021-03-24 LAB — ECHOCARDIOGRAM COMPLETE
Area-P 1/2: 2.76 cm2
Height: 64 in
MV VTI: 0.89 cm2
S' Lateral: 1.7 cm
Weight: 3728 oz

## 2021-03-24 LAB — GLUCOSE, CAPILLARY: Glucose-Capillary: 291 mg/dL — ABNORMAL HIGH (ref 70–99)

## 2021-03-24 LAB — PROCALCITONIN: Procalcitonin: <0.1 ng/mL

## 2021-03-24 LAB — C-REACTIVE PROTEIN: CRP: 2.8 mg/dL — ABNORMAL HIGH (ref <1.0)

## 2021-03-24 LAB — SEDIMENTATION RATE: Sed Rate: 47 mm/hr — ABNORMAL HIGH (ref 0–22)

## 2021-03-24 MED ORDER — AZITHROMYCIN 250 MG PO TABS
500.0000 mg | ORAL_TABLET | Freq: Every day | ORAL | Status: DC
  Administered 2021-03-24 – 2021-03-28 (×5): 500 mg via ORAL
  Filled 2021-03-24 (×5): qty 2

## 2021-03-24 MED ORDER — ADULT MULTIVITAMIN W/MINERALS CH
1.0000 | ORAL_TABLET | Freq: Every day | ORAL | Status: DC
  Administered 2021-03-24 – 2021-04-03 (×11): 1 via ORAL
  Filled 2021-03-24 (×11): qty 1

## 2021-03-24 MED ORDER — LOSARTAN POTASSIUM 50 MG PO TABS
100.0000 mg | ORAL_TABLET | Freq: Every day | ORAL | Status: DC
  Administered 2021-03-24 – 2021-04-03 (×11): 100 mg via ORAL
  Filled 2021-03-24: qty 4
  Filled 2021-03-24 (×10): qty 2

## 2021-03-24 MED ORDER — INSULIN ASPART 100 UNIT/ML IJ SOLN
0.0000 [IU] | Freq: Three times a day (TID) | INTRAMUSCULAR | Status: DC
  Administered 2021-03-24 (×3): 5 [IU] via SUBCUTANEOUS
  Administered 2021-03-25: 9 [IU] via SUBCUTANEOUS
  Administered 2021-03-25: 3 [IU] via SUBCUTANEOUS
  Filled 2021-03-24: qty 0.09

## 2021-03-24 MED ORDER — DOXYCYCLINE HYCLATE 100 MG PO TABS
100.0000 mg | ORAL_TABLET | Freq: Two times a day (BID) | ORAL | Status: DC
  Administered 2021-03-24 (×2): 100 mg via ORAL
  Filled 2021-03-24 (×2): qty 1

## 2021-03-24 MED ORDER — ONDANSETRON HCL 4 MG/2ML IJ SOLN
4.0000 mg | Freq: Four times a day (QID) | INTRAMUSCULAR | Status: DC | PRN
  Administered 2021-03-26 – 2021-03-28 (×2): 4 mg via INTRAVENOUS
  Filled 2021-03-24 (×2): qty 2

## 2021-03-24 MED ORDER — IPRATROPIUM-ALBUTEROL 0.5-2.5 (3) MG/3ML IN SOLN
3.0000 mL | Freq: Two times a day (BID) | RESPIRATORY_TRACT | Status: DC
  Administered 2021-03-24 – 2021-03-26 (×4): 3 mL via RESPIRATORY_TRACT
  Filled 2021-03-24 (×4): qty 3

## 2021-03-24 MED ORDER — ATORVASTATIN CALCIUM 40 MG PO TABS
80.0000 mg | ORAL_TABLET | Freq: Every day | ORAL | Status: DC
  Administered 2021-03-24 – 2021-04-02 (×10): 80 mg via ORAL
  Filled 2021-03-24 (×10): qty 2

## 2021-03-24 MED ORDER — FLUTICASONE PROPIONATE 50 MCG/ACT NA SUSP
1.0000 | Freq: Every day | NASAL | Status: DC
  Administered 2021-03-24 – 2021-03-30 (×6): 1 via NASAL
  Filled 2021-03-24 (×2): qty 16

## 2021-03-24 MED ORDER — IPRATROPIUM-ALBUTEROL 0.5-2.5 (3) MG/3ML IN SOLN
3.0000 mL | Freq: Four times a day (QID) | RESPIRATORY_TRACT | Status: DC
  Administered 2021-03-24 (×3): 3 mL via RESPIRATORY_TRACT
  Filled 2021-03-24 (×3): qty 3

## 2021-03-24 MED ORDER — LORATADINE 10 MG PO TABS
10.0000 mg | ORAL_TABLET | Freq: Every day | ORAL | Status: DC
  Administered 2021-03-24 – 2021-04-03 (×11): 10 mg via ORAL
  Filled 2021-03-24 (×11): qty 1

## 2021-03-24 NOTE — ED Notes (Signed)
Breakfast tray provided. 

## 2021-03-24 NOTE — Plan of Care (Signed)

## 2021-03-24 NOTE — ED Notes (Signed)
Lunch tray provided. 

## 2021-03-24 NOTE — Progress Notes (Signed)
PROGRESS NOTE    Lynn [name]  YKZ:[id number] DOB: 1956/01/12 DOA: 03/23/2021 PCP: [name], MD   Brief Narrative: 65 year old female with RA on methotrexate, diastolic heart failure, hypertension, hyperlipidemia, thalassemia type 2 diabetes admitted with fever of unknown origin. Patient reports having fever of 10 1-1 02 at home associated with some shortness of breath and nonproductive cough, she was started on doxycycline for possible bronchitis.  She continued to have fever in spite of antibiotics. Patient reports traveling to [place] for a funeral a month ago.  She has not had COVID but she has taken COVID-vaccine. CT of the chest abdomen and pelvis shows no evidence of acute or active infection. She is not on oxygen at home Procalcitonin is low, sed rate is 46 CRP is 2.8 She is a former smoker  Assessment & Plan:   Principal Problem:   Acute respiratory failure with hypoxia (HCC) Active Problems:   Essential hypertension   Chronic diastolic CHF (congestive heart failure) (HCC)   Type 2 diabetes mellitus with hyperglycemia, with long-term current use of insulin (HCC)   Acute bronchitis   SOB (shortness of breath)   Fever   Hypokalemia   Generalized weakness   # Acute hypoxic respiratory failure-of unclear etiology.  Patient has no history of asthma or COPD.  She is a former smoker.  CT chest no evidence of pulmonary embolism infiltrates atelectasis or pleural effusion noted. She has a normal procalcitonin with mildly elevated CRP of 2.8 and sed rate of 48. Discussed with ID will DC doxycycline and recheck blood cultures. Nebulizer treatments  echocardiogram.  #History of chronic diastolic heart failure last echo in April 2021 with EF of 65%.  Repeat echo ordered.  #History of essential hypertension on losartan and diltiazem.  Restart.  #type 2 dm -on insulin pump at home with Victoza.  #Chronic microcytic anemia/thalassemia Baseline hemoglobin 9-10  monitor daily.  # fever of unknown origin CT chest abdomen pelvis does not reveal any acute source for the fever.  CRP at 2.8 and sed rate is 48 with a normal procalcitonin. Will DC doxycycline recheck blood cultures. Checking echocardiogram. Discussed with ID Dr. Graylon Good  Estimated body mass index is 39.99 kg/m as calculated from the following:   Height as of this encounter: 5\' 4"  (1.626 m).   Weight as of this encounter: 105.7 kg.  DVT prophylaxis: lovenox Code Status: full Family Communication: none at bed side  Disposition Plan:  Status is: Inpatient  Remains inpatient appropriate because:Persistent severe electrolyte disturbances  Dispo: The patient is from: Home              Anticipated d/c is to: Home              Patient currently is not medically stable to d/c.   Difficult to place patient No   Consultants: id  Procedures: none Antimicrobials: none  Subjective:  Having fever at home  C/o sob   Objective: Vitals:   03/24/21 0732 03/24/21 0830 03/24/21 0900 03/24/21 1000  BP:  (!) 147/92 (!) 162/85 (!) 160/73  Pulse:  78 80 81  Resp:  14 17 20   Temp: 98.7 F (37.1 C)     TempSrc: Oral     SpO2:  98% 97% 98%  Weight:      Height:       No intake or output data in the 24 hours ending 03/24/21 1052 Filed Weights   03/23/21 1234  Weight: 105.7 kg    Examination:  General exam: in nad  Respiratory system: rhonchi b/l to auscultation. Respiratory effort normal. Cardiovascular system: S1 & S2 heard, RRR. No JVD, murmurs, rubs, gallops or clicks. No pedal edema. Gastrointestinal system: Abdomen is nondistended, soft and nontender. No organomegaly or masses felt. Normal bowel sounds heard. Central nervous system: Alert and oriented. No focal neurological deficits. Extremities: Symmetric 5 x 5 power. Skin: No rashes, lesions or ulcers Psychiatry: Judgement and insight appear normal. Mood & affect appropriate.     Data Reviewed: I have personally reviewed  following labs and imaging studies  CBC: Recent Labs  Lab 03/20/21 1220 03/23/21 1301  WBC 7.6 8.6  NEUTROABS  --  6.0  HGB 9.5* 9.3*  HCT 30.6* 32.1*  MCV 60.5 Repeated and verified X2.* 63.6*  PLT 353.0 932   Basic Metabolic Panel: Recent Labs  Lab 03/20/21 1220 03/23/21 1301  NA 137 137  K 4.0 3.3*  CL 99 102  CO2 26 27  GLUCOSE 135* 115*  BUN 15 14  CREATININE 1.19 1.05*  CALCIUM 9.5 9.0   GFR: Estimated Creatinine Clearance: 61.6 mL/min (A) (by C-G formula based on SCr of 1.05 mg/dL (H)). Liver Function Tests: Recent Labs  Lab 03/20/21 1220 03/23/21 1301  AST 19 23  ALT 28 28  ALKPHOS 80 69  BILITOT 0.5 0.4  PROT 7.2 7.4  ALBUMIN 4.1 3.5   No results for input(s): LIPASE, AMYLASE in the last 168 hours. No results for input(s): AMMONIA in the last 168 hours. Coagulation Profile: No results for input(s): INR, PROTIME in the last 168 hours. Cardiac Enzymes: No results for input(s): CKTOTAL, CKMB, CKMBINDEX, TROPONINI in the last 168 hours. BNP (last 3 results) Recent Labs    03/20/21 1220  PROBNP 57.0   HbA1C: No results for input(s): HGBA1C in the last 72 hours. CBG: Recent Labs  Lab 03/24/21 0738  GLUCAP 268*   Lipid Profile: No results for input(s): CHOL, HDL, LDLCALC, TRIG, CHOLHDL, LDLDIRECT in the last 72 hours. Thyroid Function Tests: No results for input(s): TSH, T4TOTAL, FREET4, T3FREE, THYROIDAB in the last 72 hours. Anemia Panel: No results for input(s): VITAMINB12, FOLATE, FERRITIN, TIBC, IRON, RETICCTPCT in the last 72 hours. Sepsis Labs: Recent Labs  Lab 03/24/21 0225  PROCALCITON <0.10    Recent Results (from the past 240 hour(s))  Resp Panel by RT-PCR (Flu A&B, Covid) Nasopharyngeal Swab     Status: None   Collection Time: 03/23/21  5:32 PM   Specimen: Nasopharyngeal Swab; Nasopharyngeal(NP) swabs in vial transport medium  Result Value Ref Range Status   SARS Coronavirus 2 by RT PCR NEGATIVE NEGATIVE Final    Comment:  (NOTE) SARS-CoV-2 target nucleic acids are NOT DETECTED.  The SARS-CoV-2 RNA is generally detectable in upper respiratory specimens during the acute phase of infection. The lowest concentration of SARS-CoV-2 viral copies this assay can detect is 138 copies/mL. A negative result does not preclude SARS-Cov-2 infection and should not be used as the sole basis for treatment or other patient management decisions. A negative result may occur with  improper specimen collection/handling, submission of specimen other than nasopharyngeal swab, presence of viral mutation(s) within the areas targeted by this assay, and inadequate number of viral copies(<138 copies/mL). A negative result must be combined with clinical observations, patient history, and epidemiological information. The expected result is Negative.  Fact Sheet for Patients:  EntrepreneurPulse.com.au  Fact Sheet for Healthcare Providers:  IncredibleEmployment.be  This test is no t yet approved or cleared by the [place]  FDA and  has been authorized for detection and/or diagnosis of SARS-CoV-2 by FDA under an Emergency Use Authorization (EUA). This EUA will remain  in effect (meaning this test can be used) for the duration of the COVID-19 declaration under Section 564(b)(1) of the Act, 21 U.S.C.section 360bbb-3(b)(1), unless the authorization is terminated  or revoked sooner.       Influenza A by PCR NEGATIVE NEGATIVE Final   Influenza B by PCR NEGATIVE NEGATIVE Final    Comment: (NOTE) The Xpert Xpress SARS-CoV-2/FLU/RSV plus assay is intended as an aid in the diagnosis of influenza from Nasopharyngeal swab specimens and should not be used as a sole basis for treatment. Nasal washings and aspirates are unacceptable for Xpert Xpress SARS-CoV-2/FLU/RSV testing.  Fact Sheet for Patients: EntrepreneurPulse.com.au  Fact Sheet for Healthcare  Providers: IncredibleEmployment.be  This test is not yet approved or cleared by the [place] FDA and has been authorized for detection and/or diagnosis of SARS-CoV-2 by FDA under an Emergency Use Authorization (EUA). This EUA will remain in effect (meaning this test can be used) for the duration of the COVID-19 declaration under Section 564(b)(1) of the Act, 21 U.S.C. section 360bbb-3(b)(1), unless the authorization is terminated or revoked.  Performed at [facility], [place] [address], [place], [place] [address]          Radiology Studies: CT Abdomen Pelvis Wo Contrast  Result Date: 03/23/2021 CLINICAL DATA:  Cough, nausea for 3 weeks.  Abdomen pain and fever. EXAM: CT ABDOMEN AND PELVIS WITHOUT CONTRAST TECHNIQUE: Multidetector CT imaging of the abdomen and pelvis was performed following the standard protocol without IV contrast. COMPARISON:  None. FINDINGS: Lower chest: No acute abnormality. Dense calcification of mitral valvular ring is identified. Hepatobiliary: There is mild diffuse low density of the liver. No focal lesion is identified in the liver. The gallbladder and biliary tree are normal. Pancreas: Unremarkable. No pancreatic ductal dilatation or surrounding inflammatory changes. Spleen: Normal in size without focal abnormality. Adrenals/Urinary Tract: Adrenal glands are unremarkable. Kidneys are normal, without renal calculi, focal lesion, or hydronephrosis. Bladder is unremarkable. Stomach/Bowel: Stomach is within normal limits. The appendix is not definitely seen but no inflammation is noted around cecum. No evidence of bowel wall thickening, distention, or inflammatory changes. There is diverticulosis of colon. Vascular/Lymphatic: Aortic atherosclerosis. No enlarged abdominal or pelvic lymph nodes. Reproductive: Status post hysterectomy. No adnexal masses. Other: Small umbilical herniation of mesenteric fat is noted. Musculoskeletal:  Degenerative joint changes of the spine are identified. IMPRESSION: 1. No acute abnormality identified in the abdomen and pelvis. 2. Fatty infiltration of liver. 3. Diverticulosis of colon without diverticulitis. 4. Aortic atherosclerosis. Aortic Atherosclerosis (ICD10-I70.0). Electronically Signed   By: Abelardo Diesel M.D.   On: 03/23/2021 18:47   DG Chest 2 View  Result Date: 03/23/2021 CLINICAL DATA:  Cough, shortness of breath for several weeks. EXAM: CHEST - 2 VIEW COMPARISON:  March 17, 2021 FINDINGS: The heart size and mediastinal contours are stable. Mitral annular calcification is unchanged. Both lungs are clear. The visualized skeletal structures are unremarkable. IMPRESSION: No active cardiopulmonary disease. Electronically Signed   By: Abelardo Diesel M.D.   On: 03/23/2021 14:16        Scheduled Meds:  atorvastatin  80 mg Oral q1800   fluticasone  1 spray Each Nare Daily   insulin aspart  0-9 Units Subcutaneous TID WC   ipratropium-albuterol  3 mL Nebulization Q6H   loratadine  10 mg Oral Daily   losartan  100  mg Oral Daily   multivitamin with minerals  1 tablet Oral Daily   Continuous Infusions:   LOS: 1 day    Time spent: 38 min  Georgette Shell, MD 03/24/2021, 10:52 AM

## 2021-03-24 NOTE — Consult Note (Signed)
[name]  Total days of antibiotics 1 (day 4 of doxycycline)         Reason for Consult:FUO   Referring Physician: [email or web address]  Principal Problem:   Acute respiratory failure with hypoxia ([place]) Active Problems:   Essential hypertension   Chronic diastolic CHF (congestive heart failure) ([place])   Type 2 diabetes mellitus with hyperglycemia, with long-term current use of insulin (HCC)   Acute bronchitis   SOB (shortness of breath)   Fever   Hypokalemia   Generalized weakness    HPI: Lynn [name] is a 65 y.o. female who has history of HTN, IDDM, RA on methotrexate, CAD who was admitted for worsening fever and shortness of breath. She reports that she had new onset fever and dry cough for the past 3 weeks. Her fever mostly at night, peaking at 103F at times. This was associated with poor appetite, 10# unintentional weight loss, fever, drenching nightsweats, and persistent dry barking cough. She was first seen in UC, and her PCP, was ruled out for PE (CTA chest as an outpatient on 03/21/2021, which was notable for showing no evidence of acute pulmonary embolism, while showing scattered groundglass opacities consistent with atelectasis, in the absence of any additional acute cardiopulmonary process, including no evidence of overt infiltrate, edema, pleural effusion, or pneumothorax.). Due to progression of symptoms she was started on doxycycline as an outpatient. She reports her cough was originally productive white phlegm but now mostly dry ,with some associated wheezing, doe, chest pain from coughing. She also initially had some nausea and hyperglycemia throughout this same period. Upon admit to the ED she was hypoxic on room air, requiring 2L [place] supplemental oxygen. Wbc 8.6, microcytic anemia, with mcv 63, plt 368, cr 1.0, BS 134, ua WNL. Blood and urine cx collected. Chest CT from 6/7 and abd CT from 6/9 unrevealing.  Intermittently had nasal congestion and  runny eyes as she suffers from seasonal allergies  No recent travel. No covid exposure. No tick exposure  Previously was tested at work for mTB with negative skin test roughly 7 yrs ago. No mTB exposure  She has been on the same dose of MTX for the past year.   Former smoker.   Past Medical History:  Diagnosis Date   Anginal pain ([place])    Anxiety    Arthritis    "knees, wrists, back, elbows" (07/03/2017)   Clotting disorder ([place])    blood clot in eye 2016   Coronary artery disease, non-occlusive    a. 11/2016 NSTEMI/Cath: LM nl, LAD 40p, 14md, D1/2 small, LCX 40ost, OM2/3 nl/small, RCA 35 ost/mid, RPDA/RPL small/nl.   Depression    Diastolic dysfunction    a. 11/2016 Echo: EF 55-60%, gr1 DD, sev calcified MV annulus, mildly dil LA.   Eye hemorrhage 01/2015   "right; resolved" (07/03/2017)   Headache    "monthly" (07/03/2017)   Heart murmur    History of blood transfusion    "when I had an ectopic pregnancy"   History of stomach ulcers    Hyperlipidemia    Hypertension    Microcytic anemia    OSA on CPAP    setting is unknown   Pneumonia    "several times" (07/03/2017)   Syncope 07/03/2017 X 2   called seizures but no medications   Thalassemia    "my cells are sickle cell shaped but I don't have sickle cell anemia" (07/03/2017)   Type II diabetes mellitus ([place])  Allergies:  Allergies  Allergen Reactions   Penicillins Hives    Childhood allergy Has patient had a PCN reaction causing immediate rash, facial/tongue/throat swelling, SOB or lightheadedness with hypotension: Yes Has patient had a PCN reaction causing severe rash involving mucus membranes or skin necrosis: No Has patient had a PCN reaction that required hospitalization No Has patient had a PCN reaction occurring within the last 10 years: No If all of the above answers are "NO", then may proceed with Cephalosporin use.   [name] [Dapagliflozin] Other (See Comments)    Dizzy and lethargic    Metformin And  Related     Diarrhea, bleeding    Current antibiotics:   MEDICATIONS:  atorvastatin  80 mg Oral q1800   azithromycin  500 mg Oral Daily   fluticasone  1 spray Each Nare Daily   insulin aspart  0-9 Units Subcutaneous TID WC   ipratropium-albuterol  3 mL Nebulization BID   loratadine  10 mg Oral Daily   losartan  100 mg Oral Daily   multivitamin with minerals  1 tablet Oral Daily    Social History   Tobacco Use   Smoking status: Former    Packs/day: 0.25    Years: 44.00    Pack years: 11.00    Types: Cigarettes    Quit date: 02/17/2015    Years since quitting: 6.1   Smokeless tobacco: Never  Vaping Use   Vaping Use: Never used  Substance Use Topics   Alcohol use: Yes    Alcohol/week: 0.0 standard drinks    Comment: 07/03/2017 "might have a few drinks/year"   Drug use: No    Family History  Problem Relation Age of Onset   Cancer Mother    Diabetes Mother    Hypertension Mother    Hyperlipidemia Mother    Cancer Father    Hyperlipidemia Father    Mental illness Sister    Diabetes Sister    Diabetes Maternal Grandmother    Diabetes Maternal Grandfather    Colon cancer Neg Hx    Esophageal cancer Neg Hx    Stomach cancer Neg Hx    Rectal cancer Neg Hx     Review of Systems -  12 point ros is negative except what is mentioned above in hpi  OBJECTIVE: Temp:  [98.7 F (37.1 C)-101.3 F (38.5 C)] 98.7 F (37.1 C) (06/10 0732) Pulse Rate:  [71-114] 100 (06/10 1530) Resp:  [14-55] 16 (06/10 1500) BP: (101-177)/(67-152) 150/85 (06/10 1500) SpO2:  [89 %-100 %] 100 % (06/10 1530) Physical Exam  Constitutional:  oriented to person, place, and time. appears well-developed and well-nourished. No distress. Initially had rigors HENT: Aten/AT, PERRLA, no scleral icterus Mouth/Throat: Oropharynx is clear and moist. No oropharyngeal exudate.  Cardiovascular: Normal rate, regular rhythm and normal heart sounds. Exam reveals no gallop and no friction rub.  No murmur heard.   Pulmonary/Chest: Effort normal and breath sounds normal. No respiratory distress.  diffuse wheezes at base Neck = supple, no nuchal rigidity Abdominal: Soft. Bowel sounds are normal.  exhibits no distension. There is no tenderness.  Lymphadenopathy: no cervical adenopathy. No axillary adenopathy Neurological: alert and oriented to person, place, and time.  Skin: Skin is warm and dry. No rash noted. No erythema.  Psychiatric: a normal mood and affect.  behavior is normal.    LABS: Results for orders placed or performed during the hospital encounter of 03/23/21 (from the past 48 hour(s))  CBC with Differential     Status:  Abnormal   Collection Time: 03/23/21  1:01 PM  Result Value Ref Range   WBC 8.6 4.0 - 10.5 K/uL   RBC 5.05 3.87 - 5.11 MIL/uL   Hemoglobin 9.3 (L) 12.0 - 15.0 g/dL   HCT 32.1 (L) 36.0 - 46.0 %   MCV 63.6 (L) 80.0 - 100.0 fL   MCH 18.4 (L) 26.0 - 34.0 pg   MCHC 29.0 (L) 30.0 - 36.0 g/dL   RDW 18.7 (H) 11.5 - 15.5 %   Platelets 368 150 - 400 K/uL   nRBC 0.0 0.0 - 0.2 %   Neutrophils Relative % 69 %   Neutro Abs 6.0 1.7 - 7.7 K/uL   Lymphocytes Relative 15 %   Lymphs Abs 1.3 0.7 - 4.0 K/uL   Monocytes Relative 10 %   Monocytes Absolute 0.9 0.1 - 1.0 K/uL   Eosinophils Relative 4 %   Eosinophils Absolute 0.3 0.0 - 0.5 K/uL   Basophils Relative 1 %   Basophils Absolute 0.1 0.0 - 0.1 K/uL   Immature Granulocytes 1 %   Abs Immature Granulocytes 0.09 (H) 0.00 - 0.07 K/uL    Comment: Performed at [facility], [place] [address] [place], [place] [address]  Basic metabolic panel     Status: Abnormal   Collection Time: 03/23/21  1:01 PM  Result Value Ref Range   Sodium 137 135 - 145 mmol/L   Potassium 3.3 (L) 3.5 - 5.1 mmol/L   Chloride 102 98 - 111 mmol/L   CO2 27 22 - 32 mmol/L   Glucose, Bld 115 (H) 70 - 99 mg/dL    Comment: Glucose reference range applies only to samples taken after fasting for at least 8 hours.   BUN 14 8 - 23 mg/dL    Creatinine, Ser 1.05 (H) 0.44 - 1.00 mg/dL   Calcium 9.0 8.9 - 10.3 mg/dL   GFR, Estimated 58 (L) >60 mL/min    Comment: (NOTE) Calculated using the CKD-EPI Creatinine Equation (2021)    Anion gap 8 5 - 15    Comment: Performed at [facility], [place] [address] [place], [place] [address]  Hepatic function panel     Status: None   Collection Time: 03/23/21  1:01 PM  Result Value Ref Range   Total Protein 7.4 6.5 - 8.1 g/dL   Albumin 3.5 3.5 - 5.0 g/dL   AST 23 15 - 41 U/L   ALT 28 0 - 44 U/L   Alkaline Phosphatase 69 38 - 126 U/L   Total Bilirubin 0.4 0.3 - 1.2 mg/dL   Bilirubin, Direct 0.1 0.0 - 0.2 mg/dL   Indirect Bilirubin 0.3 0.3 - 0.9 mg/dL    Comment: Performed at [facility], [place] [address], [place], [place] [address]  Resp Panel by RT-PCR (Flu A&B, Covid) Nasopharyngeal Swab     Status: None   Collection Time: 03/23/21  5:32 PM   Specimen: Nasopharyngeal Swab; Nasopharyngeal(NP) swabs in vial transport medium  Result Value Ref Range   SARS Coronavirus 2 by RT PCR NEGATIVE NEGATIVE    Comment: (NOTE) SARS-CoV-2 target nucleic acids are NOT DETECTED.  The SARS-CoV-2 RNA is generally detectable in upper respiratory specimens during the acute phase of infection. The lowest concentration of SARS-CoV-2 viral copies this assay can detect is 138 copies/mL. A negative result does not preclude SARS-Cov-2 infection and should not be used as the sole basis for treatment or other patient management decisions. A negative result may occur with  improper specimen collection/handling, submission of specimen other than nasopharyngeal swab, presence of viral mutation(s) within the areas targeted by this assay, and inadequate number of viral copies(<138 copies/mL). A negative result must be combined with clinical observations, patient history, and epidemiological information. The expected result is Negative.  Fact Sheet for Patients:   EntrepreneurPulse.com.au  Fact Sheet for Healthcare Providers:  IncredibleEmployment.be  This test is no t yet approved or cleared by the [place] FDA and  has been authorized for detection and/or diagnosis of SARS-CoV-2 by FDA under an Emergency Use Authorization (EUA). This EUA will remain  in effect (meaning this test can be used) for the duration of the COVID-19 declaration under Section 564(b)(1) of the Act, 21 U.S.C.section 360bbb-3(b)(1), unless the authorization is terminated  or revoked sooner.       Influenza A by PCR NEGATIVE NEGATIVE   Influenza B by PCR NEGATIVE NEGATIVE    Comment: (NOTE) The Xpert Xpress SARS-CoV-2/FLU/RSV plus assay is intended as an aid in the diagnosis of influenza from Nasopharyngeal swab specimens and should not be used as a sole basis for treatment. Nasal washings and aspirates are unacceptable for Xpert Xpress SARS-CoV-2/FLU/RSV testing.  Fact Sheet for Patients: EntrepreneurPulse.com.au  Fact Sheet for Healthcare Providers: IncredibleEmployment.be  This test is not yet approved or cleared by the [place] FDA and has been authorized for detection and/or diagnosis of SARS-CoV-2 by FDA under an Emergency Use Authorization (EUA). This EUA will remain in effect (meaning this test can be used) for the duration of the COVID-19 declaration under Section 564(b)(1) of the Act, 21 U.S.C. section 360bbb-3(b)(1), unless the authorization is terminated or revoked.  Performed at [facility], [place] [address], [place], [place] [address]   Urinalysis, Routine w reflex microscopic Nasopharyngeal Swab     Status: Abnormal   Collection Time: 03/23/21  5:32 PM  Result Value Ref Range   Color, Urine YELLOW YELLOW   APPearance HAZY (A) CLEAR   Specific Gravity, Urine 1.017 1.005 - 1.030   pH 5.0 5.0 - 8.0   Glucose, UA NEGATIVE NEGATIVE mg/dL   Hgb  urine dipstick SMALL (A) NEGATIVE   Bilirubin Urine NEGATIVE NEGATIVE   Ketones, ur NEGATIVE NEGATIVE mg/dL   Protein, ur NEGATIVE NEGATIVE mg/dL   Nitrite NEGATIVE NEGATIVE   Leukocytes,Ua TRACE (A) NEGATIVE   RBC / HPF 11-20 0 - 5 RBC/hpf   WBC, UA 6-10 0 - 5 WBC/hpf   Bacteria, UA RARE (A) NONE SEEN   Squamous Epithelial / LPF 0-5 0 - 5   Mucus PRESENT    Hyaline Casts, UA PRESENT     Comment: Performed at [facility], [place] [address] [place], [place] [address]  Procalcitonin - Baseline     Status: None   Collection Time: 03/24/21  2:25 AM  Result Value Ref Range   Procalcitonin <0.10 ng/mL    Comment:        Interpretation: PCT (Procalcitonin) <= 0.5 ng/mL: Systemic infection (sepsis) is not likely. Local bacterial infection is possible. (NOTE)       Sepsis PCT Algorithm           Lower Respiratory Tract                                      Infection PCT Algorithm    ----------------------------     ----------------------------  PCT < 0.25 ng/mL                PCT < 0.10 ng/mL          Strongly encourage             Strongly discourage   discontinuation of antibiotics    initiation of antibiotics    ----------------------------     -----------------------------       PCT 0.25 - 0.50 ng/mL            PCT 0.10 - 0.25 ng/mL               OR       >80% decrease in PCT            Discourage initiation of                                            antibiotics      Encourage discontinuation           of antibiotics    ----------------------------     -----------------------------         PCT >= 0.50 ng/mL              PCT 0.26 - 0.50 ng/mL               AND        <80% decrease in PCT             Encourage initiation of                                             antibiotics       Encourage continuation           of antibiotics    ----------------------------     -----------------------------        PCT >= 0.50 ng/mL                  PCT > 0.50  ng/mL               AND         increase in PCT                  Strongly encourage                                      initiation of antibiotics    Strongly encourage escalation           of antibiotics                                     -----------------------------                                           PCT <= 0.25 ng/mL  OR                                        > 80% decrease in PCT                                      Discontinue / Do not initiate                                             antibiotics  Performed at [place] [address], [place], [place] [address]   Sedimentation rate     Status: Abnormal   Collection Time: 03/24/21  2:25 AM  Result Value Ref Range   Sed Rate 47 (H) 0 - 22 mm/hr    Comment: Performed at [facility], [place] [address], [place], [place] [address]  C-reactive protein     Status: Abnormal   Collection Time: 03/24/21  2:25 AM  Result Value Ref Range   CRP 2.8 (H) <1.0 mg/dL    Comment: Performed at [facility], [place] [address], [place], [place] [address]  Blood gas, venous     Status: Abnormal   Collection Time: 03/24/21  2:30 AM  Result Value Ref Range   pH, Ven 7.398 7.250 - 7.430   pCO2, Ven 42.5 (L) 44.0 - 60.0 mmHg   pO2, Ven 143.0 (H) 32.0 - 45.0 mmHg   Bicarbonate 25.6 20.0 - 28.0 mmol/L   Acid-Base Excess 1.2 0.0 - 2.0 mmol/L   O2 Saturation 99.0 %   Patient temperature 98.6     Comment: Performed at [facility], [place] [address], [place], [place] [address]  CBG monitoring, ED     Status: Abnormal   Collection Time: 03/24/21  7:38 AM  Result Value Ref Range   Glucose-Capillary 268 (H) 70 - 99 mg/dL    Comment: Glucose reference range applies only to samples taken after fasting for at least 8 hours.  CBG monitoring, ED     Status: Abnormal   Collection Time: 03/24/21 12:06 PM  Result Value Ref Range    Glucose-Capillary 284 (H) 70 - 99 mg/dL    Comment: Glucose reference range applies only to samples taken after fasting for at least 8 hours.   Comment 1 Notify RN    Comment 2 Document in Chart     MICRO: reviewed IMAGING: CT Abdomen Pelvis Wo Contrast  Result Date: 03/23/2021 CLINICAL DATA:  Cough, nausea for 3 weeks.  Abdomen pain and fever. EXAM: CT ABDOMEN AND PELVIS WITHOUT CONTRAST TECHNIQUE: Multidetector CT imaging of the abdomen and pelvis was performed following the standard protocol without IV contrast. COMPARISON:  None. FINDINGS: Lower chest: No acute abnormality. Dense calcification of mitral valvular ring is identified. Hepatobiliary: There is mild diffuse low density of the liver. No focal lesion is identified in the liver. The gallbladder and biliary tree are normal. Pancreas: Unremarkable. No pancreatic ductal dilatation or surrounding inflammatory changes. Spleen: Normal in size without focal abnormality. Adrenals/Urinary Tract: Adrenal glands are unremarkable. Kidneys are normal, without renal calculi, focal lesion, or hydronephrosis. Bladder is unremarkable. Stomach/Bowel: Stomach is within normal limits. The appendix is not definitely seen but  no inflammation is noted around cecum. No evidence of bowel wall thickening, distention, or inflammatory changes. There is diverticulosis of colon. Vascular/Lymphatic: Aortic atherosclerosis. No enlarged abdominal or pelvic lymph nodes. Reproductive: Status post hysterectomy. No adnexal masses. Other: Small umbilical herniation of mesenteric fat is noted. Musculoskeletal: Degenerative joint changes of the spine are identified. IMPRESSION: 1. No acute abnormality identified in the abdomen and pelvis. 2. Fatty infiltration of liver. 3. Diverticulosis of colon without diverticulitis. 4. Aortic atherosclerosis. Aortic Atherosclerosis (ICD10-I70.0). Electronically Signed   By: Abelardo Diesel M.D.   On: 03/23/2021 18:47   DG Chest 2  View  Result Date: 03/23/2021 CLINICAL DATA:  Cough, shortness of breath for several weeks. EXAM: CHEST - 2 VIEW COMPARISON:  March 17, 2021 FINDINGS: The heart size and mediastinal contours are stable. Mitral annular calcification is unchanged. Both lungs are clear. The visualized skeletal structures are unremarkable. IMPRESSION: No active cardiopulmonary disease. Electronically Signed   By: Abelardo Diesel M.D.   On: 03/23/2021 14:16   ECHOCARDIOGRAM COMPLETE  Result Date: 03/24/2021    ECHOCARDIOGRAM REPORT   Patient Name:   Lynn [name] Date of Exam: 03/24/2021 Medical Rec #:  [id number]               Height:       64.0 in Accession #:    [id number]              Weight:       233.0 lb Date of Birth:  12/29/1955                BSA:          2.088 m Patient Age:    40 years                BP:           155/70 mmHg Patient Gender: F                       HR:           80 bpm. Exam Location:  Inpatient Procedure: 2D Echo, Cardiac Doppler and Color Doppler Indications:    Fever R50.9  History:        Patient has prior history of Echocardiogram examinations, most                 recent 02/09/2020. CAD and Angina, Signs/Symptoms:Murmur and                 Syncope; Risk Factors:Hypertension, Diabetes, Dyslipidemia and                 Sleep Apnea.  Sonographer:    Jonelle Sidle Dance Referring Phys: [id number] Rhetta Mura  Sonographer Comments: Reading Cardiologist notified. IMPRESSIONS  1. Left ventricular ejection fraction, by estimation, is 60 to 65%. The left ventricle has normal function. The left ventricle has no regional wall motion abnormalities. There is moderate left ventricular hypertrophy of the basal-septal segment. Left ventricular diastolic parameters are consistent with Grade II diastolic dysfunction (pseudonormalization). Elevated left ventricular end-diastolic pressure.  2. Right ventricular systolic function is normal. The right ventricular size is normal. There is mildly elevated pulmonary artery  systolic pressure. The estimated right ventricular systolic pressure is 25.4 mmHg.  3. Left atrial size was mildly dilated.  4. The mitral valve is abnormal. There is an ill defined density measuring 3.23 x 2.10cm that appears to originate off the posterior MV leaflet but cannot be  definite on this. There is also mitral annular calcification present. There is moderate mitral stenosis with a mean MVG of 55mmHg. No evidence of mitral valve regurgitation.  5. The aortic valve is normal in structure. Aortic valve regurgitation is not visualized. No aortic stenosis is present.  6. The inferior vena cava is normal in size with greater than 50% respiratory variability, suggesting right atrial pressure of 3 mmHg.  7. Recommend TEE for possible mitral valve endocarditis in setting of abnormal mitral valve and fevers. FINDINGS  Left Ventricle: Left ventricular ejection fraction, by estimation, is 60 to 65%. The left ventricle has normal function. The left ventricle has no regional wall motion abnormalities. The left ventricular internal cavity size was normal in size. There is  moderate left ventricular hypertrophy of the basal-septal segment. Left ventricular diastolic parameters are consistent with Grade II diastolic dysfunction (pseudonormalization). Elevated left ventricular end-diastolic pressure. Right Ventricle: The right ventricular size is normal. No increase in right ventricular wall thickness. Right ventricular systolic function is normal. There is mildly elevated pulmonary artery systolic pressure. The tricuspid regurgitant velocity is 2.93  m/s, and with an assumed right atrial pressure of 3 mmHg, the estimated right ventricular systolic pressure is 95.2 mmHg. Left Atrium: Left atrial size was mildly dilated. Right Atrium: Right atrial size was normal in size. Pericardium: There is no evidence of pericardial effusion. Mitral Valve: There is an ill defined density measuring 3.23 x 2.10cm that appears to originate  off the posterior MV leaflet but cannot be definite on this. There is also mitral annular calcification present. There is moderate mitral stenosis with a mean  MVG of 28mmHg. The mitral valve is abnormal. Severe mitral annular calcification. No evidence of mitral valve regurgitation. Moderate mitral valve stenosis. MV peak gradient, 18.8 mmHg. The mean mitral valve gradient is 9.0 mmHg. Tricuspid Valve: The tricuspid valve is normal in structure. Tricuspid valve regurgitation is mild . No evidence of tricuspid stenosis. Aortic Valve: The aortic valve is normal in structure. Aortic valve regurgitation is not visualized. No aortic stenosis is present. Pulmonic Valve: The pulmonic valve was normal in structure. Pulmonic valve regurgitation is not visualized. No evidence of pulmonic stenosis. Aorta: The aortic root is normal in size and structure. Venous: The inferior vena cava is normal in size with greater than 50% respiratory variability, suggesting right atrial pressure of 3 mmHg. IAS/Shunts: No atrial level shunt detected by color flow Doppler.  LEFT VENTRICLE PLAX 2D LVIDd:         3.00 cm  Diastology LVIDs:         1.70 cm  LV e' medial:    6.42 cm/s LV PW:         2.10 cm  LV E/e' medial:  25.9 LV IVS:        1.40 cm  LV e' lateral:   7.51 cm/s LVOT diam:     1.90 cm  LV E/e' lateral: 22.1 LV SV:         59 LV SV Index:   28 LVOT Area:     2.84 cm  RIGHT VENTRICLE             IVC RV Basal diam:  2.80 cm     IVC diam: 1.70 cm RV S prime:     19.60 cm/s TAPSE (M-mode): 2.1 cm LEFT ATRIUM             Index       RIGHT ATRIUM  Index LA diam:        4.20 cm 2.01 cm/m  RA Area:     9.78 cm LA Vol (A2C):   82.4 ml 39.47 ml/m RA Volume:   20.10 ml 9.63 ml/m LA Vol (A4C):   71.0 ml 34.01 ml/m LA Biplane Vol: 76.5 ml 36.65 ml/m  AORTIC VALVE LVOT Vmax:   116.00 cm/s LVOT Vmean:  75.000 cm/s LVOT VTI:    0.207 m  AORTA Ao Root diam: 3.20 cm Ao Asc diam:  3.10 cm MITRAL VALVE                TRICUSPID VALVE MV  Area (PHT): 2.76 cm     TR Peak grad:   34.3 mmHg MV Area VTI:   0.89 cm     TR Vmax:        293.00 cm/s MV Peak grad:  18.8 mmHg MV Mean grad:  9.0 mmHg     SHUNTS MV Vmax:       2.17 m/s     Systemic VTI:  0.21 m MV Vmean:      147.0 cm/s   Systemic Diam: 1.90 cm MV Decel Time: 275 msec MV E velocity: 166.00 cm/s MV A velocity: 195.00 cm/s MV E/A ratio:  0.85 Fransico Him MD Electronically signed by Fransico Him MD Signature Date/Time: 03/24/2021/12:59:52 PM    Final      Assessment/Plan:  65 yo with RA on methotrexate who reports 3 weeks of fever and cough on exam has wheezing and hypoxia. - recommend to switch to azithromycin and encourage trial of steroids - for bronchitis flare (good change that it is viral etiology) - follow up on blood cx results to see if need to expand coverage - check urine legionella (though her CXR and Chest CT do not suggest infiltrate)  FUO= high sustained fevers x 3 wks would be atypical for bronchitis. May consider getting pulm consult if not improved over the next few days to consider bronch for atypical pathogens/ possibly eos.  T2DM = would watch for hyperglycemia with steroid use   To  be seen by dr Lucianne Lei dam tomorrow  .

## 2021-03-25 ENCOUNTER — Inpatient Hospital Stay (HOSPITAL_COMMUNITY): Payer: Medicare Other

## 2021-03-25 DIAGNOSIS — R61 Generalized hyperhidrosis: Secondary | ICD-10-CM

## 2021-03-25 DIAGNOSIS — R509 Fever, unspecified: Secondary | ICD-10-CM

## 2021-03-25 DIAGNOSIS — J9601 Acute respiratory failure with hypoxia: Secondary | ICD-10-CM

## 2021-03-25 DIAGNOSIS — E1165 Type 2 diabetes mellitus with hyperglycemia: Secondary | ICD-10-CM

## 2021-03-25 DIAGNOSIS — J209 Acute bronchitis, unspecified: Secondary | ICD-10-CM

## 2021-03-25 DIAGNOSIS — Z794 Long term (current) use of insulin: Secondary | ICD-10-CM

## 2021-03-25 DIAGNOSIS — M0520 Rheumatoid vasculitis with rheumatoid arthritis of unspecified site: Secondary | ICD-10-CM

## 2021-03-25 LAB — FERRITIN: Ferritin: 130 ng/mL (ref 11–307)

## 2021-03-25 LAB — HEPATITIS PANEL, ACUTE
HCV Ab: NONREACTIVE
Hep A IgM: NONREACTIVE
Hep B C IgM: NONREACTIVE
Hepatitis B Surface Ag: NONREACTIVE

## 2021-03-25 LAB — URINE CULTURE: Culture: <10000 — AB

## 2021-03-25 LAB — CK: Total CK: 213 U/L (ref 38–234)

## 2021-03-25 LAB — GLUCOSE, CAPILLARY
Glucose-Capillary: 240 mg/dL — ABNORMAL HIGH (ref 70–99)
Glucose-Capillary: 380 mg/dL — ABNORMAL HIGH (ref 70–99)
Glucose-Capillary: 255 mg/dL — ABNORMAL HIGH (ref 70–99)
Glucose-Capillary: 203 mg/dL — ABNORMAL HIGH (ref 70–99)

## 2021-03-25 LAB — LACTATE DEHYDROGENASE: LDH: 197 U/L — ABNORMAL HIGH (ref 98–192)

## 2021-03-25 LAB — CRYPTOCOCCAL ANTIGEN: Crypto Ag: NEGATIVE

## 2021-03-25 LAB — MRSA PCR SCREENING: MRSA by PCR: NEGATIVE

## 2021-03-25 MED ORDER — SODIUM CHLORIDE 0.9 % IV SOLN: 1.0000 g | INTRAVENOUS | Status: DC

## 2021-03-25 MED ORDER — INSULIN GLARGINE 100 UNIT/ML ~~LOC~~ SOLN
6.0000 [IU] | Freq: Every day | SUBCUTANEOUS | Status: DC
  Administered 2021-03-25: 6 [IU] via SUBCUTANEOUS
  Filled 2021-03-25: qty 0.06

## 2021-03-25 MED ORDER — INSULIN ASPART 100 UNIT/ML IJ SOLN
0.0000 [IU] | Freq: Every day | INTRAMUSCULAR | Status: DC
  Administered 2021-03-25: 2 [IU] via SUBCUTANEOUS
  Administered 2021-03-27: 4 [IU] via SUBCUTANEOUS
  Administered 2021-03-28: 5 [IU] via SUBCUTANEOUS
  Administered 2021-03-30: 4 [IU] via SUBCUTANEOUS
  Administered 2021-03-31: 3 [IU] via SUBCUTANEOUS
  Administered 2021-04-02: 4 [IU] via SUBCUTANEOUS

## 2021-03-25 MED ORDER — VANCOMYCIN HCL 10 G IV SOLR
2250.0000 mg | INTRAVENOUS | Status: AC
  Administered 2021-03-25: 2250 mg via INTRAVENOUS
  Filled 2021-03-25: qty 2250

## 2021-03-25 MED ORDER — INSULIN ASPART 100 UNIT/ML IJ SOLN
3.0000 [IU] | Freq: Three times a day (TID) | INTRAMUSCULAR | Status: DC
  Administered 2021-03-25 – 2021-03-26 (×2): 3 [IU] via SUBCUTANEOUS

## 2021-03-25 MED ORDER — INSULIN ASPART 100 UNIT/ML IJ SOLN
0.0000 [IU] | Freq: Three times a day (TID) | INTRAMUSCULAR | Status: DC
  Administered 2021-03-25: 11 [IU] via SUBCUTANEOUS
  Administered 2021-03-26: 7 [IU] via SUBCUTANEOUS
  Administered 2021-03-26: 15 [IU] via SUBCUTANEOUS

## 2021-03-25 MED ORDER — VANCOMYCIN HCL 1000 MG/200ML IV SOLN
1000.0000 mg | INTRAVENOUS | Status: DC
  Administered 2021-03-26 – 2021-03-28 (×3): 1000 mg via INTRAVENOUS
  Filled 2021-03-25 (×3): qty 200

## 2021-03-25 MED ORDER — SODIUM CHLORIDE 0.9 % IV SOLN
2.0000 g | Freq: Two times a day (BID) | INTRAVENOUS | Status: DC
  Administered 2021-03-25 – 2021-03-30 (×10): 2 g via INTRAVENOUS
  Filled 2021-03-25: qty 2
  Filled 2021-03-25: qty 20
  Filled 2021-03-25: qty 2
  Filled 2021-03-25: qty 20
  Filled 2021-03-25 (×4): qty 2
  Filled 2021-03-25: qty 20
  Filled 2021-03-25: qty 2

## 2021-03-25 NOTE — Consult Note (Signed)
NAME:  Lynn [name], MRN:  [id number], DOB:  [birth year], LOS: 2 ADMISSION DATE:  03/23/2021, CONSULTATION DATE:  03/25/21 REFERRING MD:  Gwendlyn Deutscher CHIEF COMPLAINT:  respiratory failure, fevers   History of Present Illness:  Lynn [name] is a 65 year old woman with rheumatoid arthritis on methotrexate, HFpEF, hypertension and chronic anemia due to thalassemia, DMII who was admitted on 03/23/21 with acute hypoxemic respiratory failure.   She has been treated with doxycycline and azithromycin for concern of bronchitis. She has spiked fevers since admission with a Tmax of 101.62F. She has a dry cough.   CTA chest on 6/7 did not show pulmonary emboli. She has scattered diffuse areas of ground glass attenuation. Covid and influenz PCR is negative.   PCCM has been consulted for further evaluation and recommendations of her respiratory failure and fevers.   Pertinent  Medical History  Rheumatoid Arthritis Chronic Diastolic Heart Failure Hypertension Microcytic Anemia - Thalassemia DMII  Significant Hospital Events: Including procedures, antibiotic start and stop dates in addition to other pertinent events   Admitted 02/20/2021   Antibiotics: Doxycycline 6/9 - 6/10 Azithromycin 6/10 >>  Interim History / Subjective:   Patient reports having fevers, sweats and chills over the past 3 weeks.  She also reports lack of appetite and about a 10 pound weight loss.  She also reports increased knee pain where she had a prior knee replacement and also increased lower back pain.  Her husband was at the bedside as well.  She raised concerns for silicosis and she does not have any risk factors for this disease as she has been a bus driver.  No exposure to stone cutting or other occupations of the sort.  Objective   Blood pressure (!) 114/54, pulse 79, temperature 98.1 F (36.7 C), temperature source Oral, resp. rate 19, height _0  (1.626 m), weight 106.3 kg, SpO2 93 %.         Intake/Output Summary (Last 24 hours) at 03/25/2021 1255 Last data filed at 03/25/2021 1000 Gross per 24 hour  Intake 120 ml  Output 400 ml  Net -280 ml   Filed Weights   03/23/21 1234 03/24/21 2133 03/25/21 0500  Weight: 105.7 kg 106.9 kg 106.3 kg    Examination: General: Elderly woman, no acute distress resting in bed HENT: [place]/AT, no cervical adenopathy or supraclavicular adenopathy, throat clear, sclera anicteric Lungs: Crackles at right base, no wheezing or rhonchi. Cardiovascular: Regular rate and rhythm, no murmurs Abdomen: Soft, nontender, nondistended, bowel sounds present Extremities: No edema, warm Neuro: Alert and oriented x3, moving all extremities GU: Deferred  Labs/imaging that I have personally reviewed  (right click and "Reselect all SmartList Selections" daily)  CRP 2.8, ESR , LDH 197, CK 213, Ferritin 130, BNP 57  UA - negative for protein, nitrite, small hgb - 11-20 BRCs  Procal <0.1 Blood culture 6/10 >> Urine Culture - too many colonies Covid/influenza - negative  CT Abdomen 03/23/21 1. No acute abnormality identified in the abdomen and pelvis. 2. Fatty infiltration of liver. 3. Diverticulosis of colon without diverticulitis. 4. Aortic atherosclerosis.  CTA Chest 03/23/21 Emboli noted.  No mediastinal or hilar adenopathy.  Scattered groundglass attenuation scattered bilaterally concerning for air trapping.  Echo 03/23/21 EF 60 to 65%.  Moderate left ventricular hypertrophy.  RV systolic function is normal, RV size is normal.  Mild elevated pulmonary artery systolic pressure estimated at 37.3 mmHg.  Mitral valve is abnormal with ill-defined density measuring 3.23 x 2.10 cm.  Resolved  Hospital Problem list     Assessment & Plan:  Concern for Endocarditis Given recent echo findings of mitral valve density, this would explain her persistent fevers and night sweats along with weight loss over recent weeks - ID following.  - Currently on ceftriaxone,  vancomycin and azithromycin - blood cultures pending  Acute hypoxemic respiratory failure - wean O2 for Spo2 greater than 92% - Her CT scan is not concerning for primary pneumonia - She has scattered mosiac attenuation concerning for small airways disease with air trapping - No need for bronchoscopy at this time - check extended respiratory viral panel  Rest of management per primary team. PCCM will sign off unless bronchoscopy is felt necessary.   Best practice (right click and "Reselect all SmartList Selections" daily)  Per primary team  Labs   CBC: Recent Labs  Lab 03/20/21 1220 03/23/21 1301  WBC 7.6 8.6  NEUTROABS  --  6.0  HGB 9.5* 9.3*  HCT 30.6* 32.1*  MCV 60.5 Repeated and verified X2.* 63.6*  PLT 353.0 115    Basic Metabolic Panel: Recent Labs  Lab 03/20/21 1220 03/23/21 1301  NA 137 137  K 4.0 3.3*  CL 99 102  CO2 26 27  GLUCOSE 135* 115*  BUN 15 14  CREATININE 1.19 1.05*  CALCIUM 9.5 9.0   GFR: Estimated Creatinine Clearance: 61.8 mL/min (A) (by C-G formula based on SCr of 1.05 mg/dL (H)). Recent Labs  Lab 03/20/21 1220 03/23/21 1301 03/24/21 0225  PROCALCITON  --   --  <0.10  WBC 7.6 8.6  --     Liver Function Tests: Recent Labs  Lab 03/20/21 1220 03/23/21 1301  AST 19 23  ALT 28 28  ALKPHOS 80 69  BILITOT 0.5 0.4  PROT 7.2 7.4  ALBUMIN 4.1 3.5   No results for input(s): LIPASE, AMYLASE in the last 168 hours. No results for input(s): AMMONIA in the last 168 hours.  ABG    Component Value Date/Time   HCO3 25.6 03/24/2021 0230   TCO2 28 07/03/2017 0814   O2SAT 99.0 03/24/2021 0230     Coagulation Profile: No results for input(s): INR, PROTIME in the last 168 hours.  Cardiac Enzymes: Recent Labs  Lab 03/25/21 1126  CKTOTAL 213    HbA1C: Hemoglobin A1C  Date/Time Value Ref Range Status  01/30/2021 03:24 PM 8.0 (A) 4.0 - 5.6 % Final  09/23/2020 02:54 PM 8.5 (A) 4.0 - 5.6 % Final   Hgb A1c MFr Bld  Date/Time Value  Ref Range Status  03/21/2020 02:52 PM 9.8 (H) 4.6 - 6.5 % Final    Comment:    Glycemic Control Guidelines for People with Diabetes:Non Diabetic:  <6%Goal of Therapy: <7%Additional Action Suggested:  >8%   07/15/2019 02:29 PM 10.5 (H) 4.6 - 6.5 % Final    Comment:    Glycemic Control Guidelines for People with Diabetes:Non Diabetic:  <6%Goal of Therapy: <7%Additional Action Suggested:  >8%     CBG: Recent Labs  Lab 03/24/21 1206 03/24/21 1838 03/24/21 2140 03/25/21 0740 03/25/21 1150  GLUCAP 284* 282* 291* 240* 380*    Review of Systems:   Review of Systems  Constitutional:  Positive for chills, fever, malaise/fatigue and weight loss.  HENT:  Negative for congestion, sinus pain and sore throat.   Eyes: Negative.   Respiratory:  Positive for cough and shortness of breath. Negative for hemoptysis, sputum production and wheezing.   Cardiovascular:  Negative for chest pain, palpitations, orthopnea, claudication and leg swelling.  Gastrointestinal:  Positive for nausea. Negative for abdominal pain, heartburn and vomiting.  Genitourinary: Negative.   Musculoskeletal:  Negative for joint pain and myalgias.  Skin:  Negative for rash.  Neurological:  Negative for weakness.  Endo/Heme/Allergies: Negative.   Psychiatric/Behavioral: Negative.     Past Medical History:  She,  has a past medical history of Anginal pain ([place]), Anxiety, Arthritis, Clotting disorder ([place]), Coronary artery disease, non-occlusive, Depression, Diastolic dysfunction, Eye hemorrhage (01/2015), Headache, Heart murmur, History of blood transfusion, History of stomach ulcers, Hyperlipidemia, Hypertension, Microcytic anemia, OSA on CPAP, Pneumonia, Syncope (07/03/2017 X 2), Thalassemia, and Type II diabetes mellitus ([place]).   Surgical History:   Past Surgical History:  Procedure Laterality Date   48-Hour Monitor  03/18/2017   Sinus rhythm with sinus tachycardia (rate 58-134 BPM)multiple PVCs noted with couplets and  bigeminy. One triplet. 7 runs of PAT ranging from 100-130 bpm. Longest was 33 beats.   BREAST BIOPSY Left    CARDIAC CATHETERIZATION N/A 11/19/2016   Procedure: Left Heart Cath and Coronary Angiography;  Surgeon: Belva Crome, MD;  Location: [facility];  Service: Cardiovascular.    LM nl, LAD 40p, 41m, D1/2 small, LCX 40ost, OM2/3 nl/small, RCA 35 ost/mid, RPDA/RPL small/nl.   CARPAL TUNNEL RELEASE Right 11/01/2014   COLONOSCOPY     DILATION AND CURETTAGE OF UTERUS     ECTOPIC PREGNANCY SURGERY  X 2   JOINT REPLACEMENT     KNEE ARTHROSCOPY Left 11/2014   TONSILLECTOMY     TOTAL KNEE ARTHROPLASTY Right 02/18/2015   Procedure: RIGHT TOTAL KNEE ARTHROPLASTY;  Surgeon: SNetta Cedars MD;  [facility]  Service: Orthopedics;  Laterality: Right;   TOTAL KNEE ARTHROPLASTY Left 08/19/2015   Procedure: LEFT TOTAL KNEE ARTHROPLASTY;  Surgeon: SNetta Cedars MD;  [facility]  Service: Orthopedics;  Laterality: Left;   TRANSTHORACIC ECHOCARDIOGRAM  11/2016; 07/2017:   a) normal LV size, thickness and function. EF 55-60%. GR 1 DD.No RWMA severe mitral annular calcification, but no mitral stenosis. Mild LA dilation;; b) normal LV size and function.  EF 65-75%.  Unable to assess diastolic function.  Aortic sclerosis with no stenosis.  Moderate mitral stenosis? (Gradient 9 mmHg) -?  Mild-mod left atrial enlargement    TRANSTHORACIC ECHOCARDIOGRAM  01/2019   EF 65 to 70%.  Normal function.  Elevated LVEDP-GR 1 DD.  Normal RV size and function.  Large calcific mass on posterior mitral leaflet mild to moderate mitral stenosis.   VAGINAL HYSTERECTOMY       Social History:   reports that she quit smoking about 6 years ago. Her smoking use included cigarettes. She has a 11.00 pack-year smoking history. She has never used smokeless tobacco. She reports current alcohol use. She reports that she does not use drugs.   Family History:  Her family history includes Cancer in her father and mother; Diabetes  in her maternal grandfather, maternal grandmother, mother, and sister; Hyperlipidemia in her father and mother; Hypertension in her mother; Mental illness in her sister. There is no history of Colon cancer, Esophageal cancer, Stomach cancer, or Rectal cancer.   Allergies Allergies  Allergen Reactions   Penicillins Hives    Childhood allergy Has patient had a PCN reaction causing immediate rash, facial/tongue/throat swelling, SOB or lightheadedness with hypotension: Yes Has patient had a PCN reaction causing severe rash involving mucus membranes or skin necrosis: No Has patient had a PCN reaction that required hospitalization No Has patient had a PCN reaction occurring within  the last 10 years: No If all of the above answers are "NO", then may proceed with Cephalosporin use.   [name] [Dapagliflozin] Other (See Comments)    Dizzy and lethargic    Metformin And Related     Diarrhea, bleeding     Home Medications  Prior to Admission medications   Medication Sig Start Date End Date Taking? Authorizing Provider  acetaminophen (TYLENOL) 500 MG tablet Take 500 mg by mouth every 6 (six) hours as needed for fever.   Yes [provider]  atorvastatin (LIPITOR) 80 MG tablet TAKE 1 TABLET(80 MG) BY MOUTH DAILY AT 6 PM Patient taking differently: Take 80 mg by mouth daily. 02/20/21  Yes Copland, Gay Filler, MD  cetirizine (ZYRTEC) 10 MG tablet Take 10 mg by mouth daily as needed for allergies.   Yes [provider]  diltiazem (CARDIZEM CD) 240 MG 24 hr capsule Take 1 capsule (240 mg total) by mouth daily. 08/09/20 03/23/21 Yes Leonie Man, MD  doxycycline (VIBRAMYCIN) 100 MG capsule Take 1 capsule (100 mg total) by mouth 2 (two) times daily. Patient taking differently: Take 100 mg by mouth 2 (two) times daily. Start date : 03/20/21 03/20/21  Yes Copland, Gay Filler, MD  folic acid (FOLVITE) 1 MG tablet Take 2 tablets (2 mg total) by mouth daily. Patient taking differently: Take 1 mg  by mouth 2 (two) times daily. 01/21/20  Yes Copland, Gay Filler, MD  Insulin Disposable Pump (OMNIPOD DASH PODS, GEN 4,) MISC Inject 1 Dose into the skin continuous. Using pump - depends on carb intake. 10/03/20  Yes [provider]  insulin lispro (HUMALOG) 100 UNIT/ML injection Inject 8 Units into the skin See admin instructions. Back up for pump. 02/07/21  Yes [provider]  losartan (COZAAR) 100 MG tablet Take 1 tablet (100 mg total) by mouth daily. 08/09/20 03/23/21 Yes Leonie Man, MD  methotrexate 50 MG/2ML injection Inject 50 mg/m2 into the skin once a week. 08/11/19  Yes [provider]  Multiple Vitamin (MULTIVITAMIN WITH MINERALS) TABS tablet Take 1 tablet by mouth daily.   Yes [provider]  VICTOZA 18 MG/3ML SOPN INJECT 1.8 MG UNDER THE SKIN ONCE DAILY Patient taking differently: Inject 8.1 mg into the skin daily. 02/20/21  Yes Copland, Gay Filler, MD  Continuous Blood Gluc Sensor (DEXCOM G6 SENSOR) MISC 1 Device by Does not apply route as directed. 06/17/20   [name], Melanie Crazier, MD  Continuous Blood Gluc Transmit (DEXCOM G6 TRANSMITTER) MISC 1 Device by Does not apply route as directed. 06/17/20   [name], Melanie Crazier, MD  fluticasone (FLOVENT DISKUS) 50 MCG/BLIST diskus inhaler Inhale 1 puff into the lungs 2 (two) times daily. 03/17/21   Hazel Sams, PA-C  glucose blood (CONTOUR NEXT TEST) test strip 3x daily 07/20/19   [name], Melanie Crazier, MD  hydrochlorothiazide (HYDRODIURIL) 12.5 MG tablet Take one by mouth daily Patient not taking: No sig reported 01/21/20   Copland, Gay Filler, MD  Insulin Pen Needle (B-D UF III MINI PEN NEEDLES) 31G X 5 MM MISC USE DAILY WITH VICTOZA AND LANTUS. 07/20/19   [name], Melanie Crazier, MD  Insulin Pen Needle 31G X 5 MM MISC 1 Device by Does not apply route 3 (three) times daily. 01/21/20   Copland, Gay Filler, MD  isosorbide mononitrate (IMDUR) 30 MG 24 hr tablet Take 30 mg  tablet as needed for  Chest pain spasm episodes for 3 days and the stop Patient not taking: No sig reported  08/09/20   Leonie Man, MD  Lancets 30G MISC 1 Device by Does not apply route 3 (three) times daily. 07/16/19   Copland, Gay Filler, MD  SAFETY-LOK TB SYRINGE 27GX.5" 27G X 1/2" 1 ML MISC  08/18/19   [provider]  Zoster Vaccine Adjuvanted [facility]) injection Inject into the muscle. Patient taking differently: Inject 0.5 mLs into the muscle once. 02/20/21   Carlyle Basques, MD     Critical care time: n/a    Freda Jackson, MD [place] Office: [phone]   See Amion for personal pager PCCM on call pager [phone] until 7pm. Please call Elink 7p-7a. [phone]

## 2021-03-25 NOTE — Progress Notes (Addendum)
Subjective: Still having fevers and cough (though less cough)  Antibiotics:  Anti-infectives (From admission, onward)    Start     Dose/Rate Route Frequency Ordered Stop   03/24/21 1530  azithromycin (ZITHROMAX) tablet 500 mg        500 mg Oral Daily 03/24/21 1525     03/24/21 0145  doxycycline (VIBRA-TABS) tablet 100 mg  Status:  Discontinued       Note to Pharmacy: Patient taking differently: Start date : 03/20/21     100 mg Oral 2 times daily 03/24/21 0131 03/24/21 1038       Medications: Scheduled Meds:  atorvastatin  80 mg Oral q1800   azithromycin  500 mg Oral Daily   fluticasone  1 spray Each Nare Daily   insulin aspart  0-9 Units Subcutaneous TID WC   ipratropium-albuterol  3 mL Nebulization BID   loratadine  10 mg Oral Daily   losartan  100 mg Oral Daily   multivitamin with minerals  1 tablet Oral Daily   Continuous Infusions: PRN Meds:.acetaminophen **OR** acetaminophen, albuterol, ondansetron (ZOFRAN) IV    Objective: Weight change: 1.212 kg  Intake/Output Summary (Last 24 hours) at 03/25/2021 1108 Last data filed at 03/25/2021 1000 Gross per 24 hour  Intake 120 ml  Output 400 ml  Net -280 ml   Blood pressure (!) 157/56, pulse 86, temperature (!) 101.2 F (38.4 C), temperature source Oral, resp. rate 20, height 5\' 4"  (1.626 m), weight 106.3 kg, SpO2 96 %. Temp:  [98.3 F (36.8 C)-101.2 F (38.4 C)] 101.2 F (38.4 C) (06/11 0924) Pulse Rate:  [78-114] 86 (06/11 0924) Resp:  [16-21] 20 (06/11 0924) BP: (101-170)/(56-152) 157/56 (06/11 0924) SpO2:  [91 %-100 %] 96 % (06/11 0924) Weight:  [106.3 kg-106.9 kg] 106.3 kg (06/11 0500)  Physical Exam: Physical Exam Constitutional:      General: She is not in acute distress.    Appearance: She is well-developed. She is not diaphoretic.  HENT:     Head: Normocephalic and atraumatic.     Right Ear: External ear normal.     Left Ear: External ear normal.     Mouth/Throat:     Pharynx: No  oropharyngeal exudate.  Eyes:     General: No scleral icterus.    Extraocular Movements: Extraocular movements intact.     Conjunctiva/sclera: Conjunctivae normal.     Pupils: Pupils are equal, round, and reactive to light.  Cardiovascular:     Rate and Rhythm: Normal rate and regular rhythm.     Heart sounds: Normal heart sounds. No murmur heard.   No friction rub. No gallop.  Pulmonary:     Effort: Pulmonary effort is normal. No respiratory distress.     Breath sounds: Normal breath sounds. No stridor. No wheezing, rhonchi or rales.  Abdominal:     General: Bowel sounds are normal. There is no distension.     Palpations: Abdomen is soft.     Tenderness: There is no abdominal tenderness.  Musculoskeletal:        General: No tenderness. Normal range of motion.  Lymphadenopathy:     Cervical: No cervical adenopathy.  Skin:    General: Skin is warm and dry.     Coloration: Skin is not pale.     Findings: No abrasion, abscess, erythema or rash.     Comments: Feet without any ulcerations or lesions  Neurological:     General: No focal deficit present.  Mental Status: She is alert and oriented to person, place, and time.     Motor: No abnormal muscle tone.     Coordination: Coordination normal.  Psychiatric:        Mood and Affect: Mood normal.        Behavior: Behavior normal.        Thought Content: Thought content normal.        Judgment: Judgment normal.     CBC:    BMET Recent Labs    03/23/21 1301  NA 137  K 3.3*  CL 102  CO2 27  GLUCOSE 115*  BUN 14  CREATININE 1.05*  CALCIUM 9.0     Liver Panel  Recent Labs    03/23/21 1301  PROT 7.4  ALBUMIN 3.5  AST 23  ALT 28  ALKPHOS 69  BILITOT 0.4  BILIDIR 0.1  IBILI 0.3       Sedimentation Rate Recent Labs    03/24/21 0225  ESRSEDRATE 47*   C-Reactive Protein Recent Labs    03/24/21 0225  CRP 2.8*    Micro Results: Recent Results (from the past 720 hour(s))  Resp Panel by RT-PCR  (Flu A&B, Covid) Nasopharyngeal Swab     Status: None   Collection Time: 03/23/21  5:32 PM   Specimen: Nasopharyngeal Swab; Nasopharyngeal(NP) swabs in vial transport medium  Result Value Ref Range Status   SARS Coronavirus 2 by RT PCR NEGATIVE NEGATIVE Final    Comment: (NOTE) SARS-CoV-2 target nucleic acids are NOT DETECTED.  The SARS-CoV-2 RNA is generally detectable in upper respiratory specimens during the acute phase of infection. The lowest concentration of SARS-CoV-2 viral copies this assay can detect is 138 copies/mL. A negative result does not preclude SARS-Cov-2 infection and should not be used as the sole basis for treatment or other patient management decisions. A negative result may occur with  improper specimen collection/handling, submission of specimen other than nasopharyngeal swab, presence of viral mutation(s) within the areas targeted by this assay, and inadequate number of viral copies(<138 copies/mL). A negative result must be combined with clinical observations, patient history, and epidemiological information. The expected result is Negative.  Fact Sheet for Patients:  EntrepreneurPulse.com.au  Fact Sheet for Healthcare Providers:  IncredibleEmployment.be  This test is no t yet approved or cleared by the [place] FDA and  has been authorized for detection and/or diagnosis of SARS-CoV-2 by FDA under an Emergency Use Authorization (EUA). This EUA will remain  in effect (meaning this test can be used) for the duration of the COVID-19 declaration under Section 564(b)(1) of the Act, 21 U.S.C.section 360bbb-3(b)(1), unless the authorization is terminated  or revoked sooner.       Influenza A by PCR NEGATIVE NEGATIVE Final   Influenza B by PCR NEGATIVE NEGATIVE Final    Comment: (NOTE) The Xpert Xpress SARS-CoV-2/FLU/RSV plus assay is intended as an aid in the diagnosis of influenza from Nasopharyngeal swab specimens  and should not be used as a sole basis for treatment. Nasal washings and aspirates are unacceptable for Xpert Xpress SARS-CoV-2/FLU/RSV testing.  Fact Sheet for Patients: EntrepreneurPulse.com.au  Fact Sheet for Healthcare Providers: IncredibleEmployment.be  This test is not yet approved or cleared by the [place] FDA and has been authorized for detection and/or diagnosis of SARS-CoV-2 by FDA under an Emergency Use Authorization (EUA). This EUA will remain in effect (meaning this test can be used) for the duration of the COVID-19 declaration under Section 564(b)(1) of the Act, 21 U.S.C. section  360bbb-3(b)(1), unless the authorization is terminated or revoked.  Performed at [facility], [place] [address] [place], [place] [address]   Urine culture     Status: Abnormal   Collection Time: 03/23/21  5:32 PM   Specimen: Urine, Clean Catch  Result Value Ref Range Status   Specimen Description   Final    URINE, CLEAN CATCH Performed at [facility], [place] [address], [place], [place] [address]    Special Requests   Final    NONE Performed at [facility], [place] [address] [place], [place] [address]    Culture (A)  Final    <10,000 COLONIES/mL INSIGNIFICANT GROWTH Performed at [place] [address], [place], [place] [address]    Report Status 03/25/2021 FINAL  Final  Culture, blood (Routine X 2) w Reflex to ID Panel     Status: None (Preliminary result)   Collection Time: 03/24/21  2:25 AM   Specimen: BLOOD  Result Value Ref Range Status   Specimen Description   Final    BLOOD BLOOD LEFT FOREARM Performed at [place] [address], [place], [place] [address]    Special Requests   Final    BOTTLES DRAWN AEROBIC AND ANAEROBIC Blood Culture adequate volume Performed at [place] [address], [place], [place] [address]     Culture   Final    NO GROWTH 1 DAY Performed at [facility], [place] [address], [place], [name] [address]    Report Status PENDING  Incomplete  Culture, blood (Routine X 2) w Reflex to ID Panel     Status: None (Preliminary result)   Collection Time: 03/24/21  2:30 AM   Specimen: BLOOD  Result Value Ref Range Status   Specimen Description   Final    BLOOD BLOOD RIGHT HAND Performed at [place] [address], [place], [place] [address]    Special Requests   Final    BOTTLES DRAWN AEROBIC AND ANAEROBIC Blood Culture adequate volume Performed at [place] [address] [place], [place] [address]    Culture   Final    NO GROWTH 1 DAY Performed at [facility], [place] [address], [place], [place] [address]    Report Status PENDING  Incomplete  Culture, blood (routine x 2)     Status: None (Preliminary result)   Collection Time: 03/24/21 11:30 AM   Specimen: BLOOD  Result Value Ref Range Status   Specimen Description   Final    BLOOD LEFT ANTECUBITAL Performed at [facility], [place] [address] [place], [place] [address]    Special Requests   Final    BOTTLES DRAWN AEROBIC AND ANAEROBIC Blood Culture adequate volume Performed at [place] [address] [place], [place] [address]    Culture   Final    NO GROWTH < 12 HOURS Performed at [place] [address], [place],  [address]    Report Status PENDING  Incomplete    Studies/Results: CT Abdomen Pelvis Wo Contrast  Result Date: 03/23/2021 CLINICAL DATA:  Cough, nausea for 3 weeks.  Abdomen pain and fever. EXAM: CT ABDOMEN AND PELVIS WITHOUT CONTRAST TECHNIQUE: Multidetector CT imaging of the abdomen and pelvis was performed following the standard protocol without IV contrast. COMPARISON:  None. FINDINGS: Lower chest: No acute abnormality. Dense calcification of mitral valvular ring is identified. Hepatobiliary: There  is mild  diffuse low density of the liver. No focal lesion is identified in the liver. The gallbladder and biliary tree are normal. Pancreas: Unremarkable. No pancreatic ductal dilatation or surrounding inflammatory changes. Spleen: Normal in size without focal abnormality. Adrenals/Urinary Tract: Adrenal glands are unremarkable. Kidneys are normal, without renal calculi, focal lesion, or hydronephrosis. Bladder is unremarkable. Stomach/Bowel: Stomach is within normal limits. The appendix is not definitely seen but no inflammation is noted around cecum. No evidence of bowel wall thickening, distention, or inflammatory changes. There is diverticulosis of colon. Vascular/Lymphatic: Aortic atherosclerosis. No enlarged abdominal or pelvic lymph nodes. Reproductive: Status post hysterectomy. No adnexal masses. Other: Small umbilical herniation of mesenteric fat is noted. Musculoskeletal: Degenerative joint changes of the spine are identified. IMPRESSION: 1. No acute abnormality identified in the abdomen and pelvis. 2. Fatty infiltration of liver. 3. Diverticulosis of colon without diverticulitis. 4. Aortic atherosclerosis. Aortic Atherosclerosis (ICD10-I70.0). Electronically Signed   By: Abelardo Diesel M.D.   On: 03/23/2021 18:47   DG Chest 2 View  Result Date: 03/23/2021 CLINICAL DATA:  Cough, shortness of breath for several weeks. EXAM: CHEST - 2 VIEW COMPARISON:  March 17, 2021 FINDINGS: The heart size and mediastinal contours are stable. Mitral annular calcification is unchanged. Both lungs are clear. The visualized skeletal structures are unremarkable. IMPRESSION: No active cardiopulmonary disease. Electronically Signed   By: Abelardo Diesel M.D.   On: 03/23/2021 14:16   ECHOCARDIOGRAM COMPLETE  Result Date: 03/24/2021    ECHOCARDIOGRAM REPORT   Patient Name:   RICHELL CORKER Date of Exam: 03/24/2021 Medical Rec #:  [id number]               Height:       64.0 in Accession #:    [id number]              Weight:        233.0 lb Date of Birth:  1955-12-09                BSA:          2.088 m Patient Age:    71 years                BP:           155/70 mmHg Patient Gender: F                       HR:           80 bpm. Exam Location:  Inpatient Procedure: 2D Echo, Cardiac Doppler and Color Doppler Indications:    Fever R50.9  History:        Patient has prior history of Echocardiogram examinations, most                 recent 02/09/2020. CAD and Angina, Signs/Symptoms:Murmur and                 Syncope; Risk Factors:Hypertension, Diabetes, Dyslipidemia and                 Sleep Apnea.  Sonographer:    Jonelle Sidle Dance Referring Phys: [id number] Rhetta Mura  Sonographer Comments: Reading Cardiologist notified. IMPRESSIONS  1. Left ventricular ejection fraction, by estimation, is 60 to 65%. The left ventricle has normal function. The left ventricle has no regional wall motion abnormalities. There is moderate left ventricular hypertrophy of the basal-septal segment. Left ventricular diastolic parameters are consistent with Grade II diastolic dysfunction (pseudonormalization). Elevated  left ventricular end-diastolic pressure.  2. Right ventricular systolic function is normal. The right ventricular size is normal. There is mildly elevated pulmonary artery systolic pressure. The estimated right ventricular systolic pressure is 28.0 mmHg.  3. Left atrial size was mildly dilated.  4. The mitral valve is abnormal. There is an ill defined density measuring 3.23 x 2.10cm that appears to originate off the posterior MV leaflet but cannot be definite on this. There is also mitral annular calcification present. There is moderate mitral stenosis with a mean MVG of 27mmHg. No evidence of mitral valve regurgitation.  5. The aortic valve is normal in structure. Aortic valve regurgitation is not visualized. No aortic stenosis is present.  6. The inferior vena cava is normal in size with greater than 50% respiratory variability, suggesting right atrial  pressure of 3 mmHg.  7. Recommend TEE for possible mitral valve endocarditis in setting of abnormal mitral valve and fevers. FINDINGS  Left Ventricle: Left ventricular ejection fraction, by estimation, is 60 to 65%. The left ventricle has normal function. The left ventricle has no regional wall motion abnormalities. The left ventricular internal cavity size was normal in size. There is  moderate left ventricular hypertrophy of the basal-septal segment. Left ventricular diastolic parameters are consistent with Grade II diastolic dysfunction (pseudonormalization). Elevated left ventricular end-diastolic pressure. Right Ventricle: The right ventricular size is normal. No increase in right ventricular wall thickness. Right ventricular systolic function is normal. There is mildly elevated pulmonary artery systolic pressure. The tricuspid regurgitant velocity is 2.93  m/s, and with an assumed right atrial pressure of 3 mmHg, the estimated right ventricular systolic pressure is 03.4 mmHg. Left Atrium: Left atrial size was mildly dilated. Right Atrium: Right atrial size was normal in size. Pericardium: There is no evidence of pericardial effusion. Mitral Valve: There is an ill defined density measuring 3.23 x 2.10cm that appears to originate off the posterior MV leaflet but cannot be definite on this. There is also mitral annular calcification present. There is moderate mitral stenosis with a mean  MVG of 20mmHg. The mitral valve is abnormal. Severe mitral annular calcification. No evidence of mitral valve regurgitation. Moderate mitral valve stenosis. MV peak gradient, 18.8 mmHg. The mean mitral valve gradient is 9.0 mmHg. Tricuspid Valve: The tricuspid valve is normal in structure. Tricuspid valve regurgitation is mild . No evidence of tricuspid stenosis. Aortic Valve: The aortic valve is normal in structure. Aortic valve regurgitation is not visualized. No aortic stenosis is present. Pulmonic Valve: The pulmonic valve was  normal in structure. Pulmonic valve regurgitation is not visualized. No evidence of pulmonic stenosis. Aorta: The aortic root is normal in size and structure. Venous: The inferior vena cava is normal in size with greater than 50% respiratory variability, suggesting right atrial pressure of 3 mmHg. IAS/Shunts: No atrial level shunt detected by color flow Doppler.  LEFT VENTRICLE PLAX 2D LVIDd:         3.00 cm  Diastology LVIDs:         1.70 cm  LV e' medial:    6.42 cm/s LV PW:         2.10 cm  LV E/e' medial:  25.9 LV IVS:        1.40 cm  LV e' lateral:   7.51 cm/s LVOT diam:     1.90 cm  LV E/e' lateral: 22.1 LV SV:         59 LV SV Index:   28 LVOT Area:  2.84 cm  RIGHT VENTRICLE             IVC RV Basal diam:  2.80 cm     IVC diam: 1.70 cm RV S prime:     19.60 cm/s TAPSE (M-mode): 2.1 cm LEFT ATRIUM             Index       RIGHT ATRIUM          Index LA diam:        4.20 cm 2.01 cm/m  RA Area:     9.78 cm LA Vol (A2C):   82.4 ml 39.47 ml/m RA Volume:   20.10 ml 9.63 ml/m LA Vol (A4C):   71.0 ml 34.01 ml/m LA Biplane Vol: 76.5 ml 36.65 ml/m  AORTIC VALVE LVOT Vmax:   116.00 cm/s LVOT Vmean:  75.000 cm/s LVOT VTI:    0.207 m  AORTA Ao Root diam: 3.20 cm Ao Asc diam:  3.10 cm MITRAL VALVE                TRICUSPID VALVE MV Area (PHT): 2.76 cm     TR Peak grad:   34.3 mmHg MV Area VTI:   0.89 cm     TR Vmax:        293.00 cm/s MV Peak grad:  18.8 mmHg MV Mean grad:  9.0 mmHg     SHUNTS MV Vmax:       2.17 m/s     Systemic VTI:  0.21 m MV Vmean:      147.0 cm/s   Systemic Diam: 1.90 cm MV Decel Time: 275 msec MV E velocity: 166.00 cm/s MV A velocity: 195.00 cm/s MV E/A ratio:  0.85 Fransico Him MD Electronically signed by Fransico Him MD Signature Date/Time: 03/24/2021/12:59:52 PM    Final       Assessment/Plan:  INTERVAL HISTORY: still having signficant fevers   Principal Problem:   Acute respiratory failure with hypoxia (HCC) Active Problems:   Essential hypertension   Chronic diastolic CHF  (congestive heart failure) (HCC)   Type 2 diabetes mellitus with hyperglycemia, with long-term current use of insulin (HCC)   Acute bronchitis   SOB (shortness of breath)   Fever   Hypokalemia   Generalized weakness    Lynn [name] is a 65 y.o. female with RA on MTX (followed by Leafy Kindle PA at [facility]) IDDM CAD who has had 3 week hx of fevers, drenching night sweats and dry nonproductive cough. Prior to onset of her symptoms she had travelled to [place] for a funeral and had her 2nd COVID booster. She had zoster vaccine upon return and then restarted her MTX. She noted symptoms started shortly  thereafter though not clear there would be a correlation between these events. She had remote exposure to TB with negative TB skin test. She has no pets. She has travelled [place] the [place] CTA here is fairly unremarkable other than ground glass opacities at bases. CT abdomen negative. Blood cultures are without growth. She is fevering through azithromycin.  She tells me today that she was told that she had an abnormal lab with Rheumatology and was supposed to come back Monday for repeat blood work.  #1 FUO:  I will send off battery of FUO labs. I will also send specific labs for cryptococcus ag in blood,  Histoplasma ag, blastomcyes ag in urine and coccidioides ab in serum  I will repeat her CXR  May be  helpful to consult Pulmonary though her CT scan  is fairly underwhelming.  I spent more than 35 minutes with the patient including greater than 50% of time in face to face counseling of the patient personally reviewing radiographs, along with pertinent laboratory microbiological data review of medical records and in coordination of her care.   ADDENDUM: Her Layla Barter is informing the patient has a possible mass on the mitral valve 2D echocardiogram.  We will start ceftriaxone and vancomycin.  She does need a transesophageal  echocardiogram.  LOS: 2 days   Alcide Evener 03/25/2021, 11:08 AM

## 2021-03-25 NOTE — Progress Notes (Signed)
Pharmacy Antibiotic Note  Lynn [name] is a 65 y.o. female admitted on 03/23/2021 with fevers. ID consulted and following. Pharmacy has been consulted for Vancomycin dosing for possible mitral valve endocarditis.  Plan: Vancomycin 2250mg  IV x 1, then 1g IV q24h. Vancomycin levels at steady state, as indicated.  Monitor renal function (f/u SCr in AM), cultures, clinical course.   Height: 5\' 4"  (162.6 cm) Weight: 106.3 kg (234 lb 5.6 oz) IBW/kg (Calculated) : 54.7  Temp (24hrs), Avg:99.5 F (37.5 C), Min:98.1 F (36.7 C), Max:101.2 F (38.4 C)  Recent Labs  Lab 03/20/21 1220 03/23/21 1301  WBC 7.6 8.6  CREATININE 1.19 1.05*    Estimated Creatinine Clearance: 61.8 mL/min (A) (by C-G formula based on SCr of 1.05 mg/dL (H)).    Allergies  Allergen Reactions   Penicillins Hives    Childhood allergy Has patient had a PCN reaction causing immediate rash, facial/tongue/throat swelling, SOB or lightheadedness with hypotension: Yes Has patient had a PCN reaction causing severe rash involving mucus membranes or skin necrosis: No Has patient had a PCN reaction that required hospitalization No Has patient had a PCN reaction occurring within the last 10 years: No If all of the above answers are "NO", then may proceed with Cephalosporin use.   [name] [Dapagliflozin] Other (See Comments)    Dizzy and lethargic    Metformin And Related     Diarrhea, bleeding    Antimicrobials this admission: 6/10 Doxycycline >> 6/10 6/10 Azithromycin >> 6/11 Ceftriaxone >> 6/11 Vancomycin >>   Thank you for allowing pharmacy to be a part of this patient's care.  Luiz Ochoa 03/25/2021 3:08 PM

## 2021-03-25 NOTE — Plan of Care (Signed)

## 2021-03-25 NOTE — Progress Notes (Signed)
PROGRESS NOTE    Lynn [name]  FKC:[id number] DOB: March 20, 1956 DOA: 03/23/2021 PCP: [name], MD   Brief Narrative: 65 year old female with RA on methotrexate, diastolic heart failure, hypertension, hyperlipidemia, thalassemia type 2 diabetes admitted with fever of unknown origin. Patient reports having fever of 10 1-1 02 at home associated with some shortness of breath and nonproductive cough, she was started on doxycycline for possible bronchitis.  She continued to have fever in spite of antibiotics. Patient reports traveling to [place] for a funeral a month ago.  She has not had COVID but she has taken COVID-vaccine. CT of the chest abdomen and pelvis shows no evidence of acute or active infection. She is not on oxygen at home Procalcitonin is low, sed rate is 46 CRP is 2.8 She is a former smoker  Assessment & Plan:   Principal Problem:   Acute respiratory failure with hypoxia (HCC) Active Problems:   Essential hypertension   Chronic diastolic CHF (congestive heart failure) (HCC)   Type 2 diabetes mellitus with hyperglycemia, with long-term current use of insulin (HCC)   Acute bronchitis   SOB (shortness of breath)   Fever   Hypokalemia   Generalized weakness   # Acute hypoxic respiratory failure-of unclear etiology.  Patient has no history of asthma or COPD.  She is a former smoker.   CT chest no evidence of pulmonary embolism infiltrates atelectasis or pleural effusion noted. She has a normal procalcitonin with mildly elevated CRP of 2.8 and sed rate of 48. Discussed with ID will DC doxycycline and recheck blood cultures. Azithromycin started 03/24/2021 Rocephin started 03/25/2021 for possible CAP .  Discussed with PCCM.  Will consider bronc early next week Nebulizer treatments  Echocardiogram-action fraction 60 to 65%.  Abnormal density in the mitral valve.  #History of chronic diastolic heart failure last echo in April 2021 with EF of 65%.  Echo with  ejection fraction 60 to 65%.  #History of essential hypertension on losartan and diltiazem.  Restart.  #type 2 dm -on insulin pump at home with Victoza.  Will increase SSI to resistant, Lantus 6 units nightly and 3 units of NovoLog 3 times a day prior to meals. CBG (last 3)  Recent Labs    03/24/21 2140 03/25/21 0740 03/25/21 1150  GLUCAP 291* 240* 380*     #Chronic microcytic anemia/thalassemia Baseline hemoglobin 9-10 monitor daily.  # fever of unknown origin CT chest abdomen pelvis does not reveal any acute source for the fever.   CRP at 2.8 and sed rate is 48 with a normal procalcitonin. Appreciate ID input.  Discussed with PCCM.  We will start patient on Rocephin and vancomycin for possible mitral valve endocarditis. Will set her up for transesophageal echo next week.  Estimated body mass index is 40.23 kg/m as calculated from the following:   Height as of this encounter: 5\' 4"  (1.626 m).   Weight as of this encounter: 106.3 kg.  DVT prophylaxis: lovenox Code Status: full Family Communication: none at bed side  Disposition Plan:  Status is: Inpatient  Remains inpatient appropriate because:Persistent severe electrolyte disturbances  Dispo: The patient is from: Home              Anticipated d/c is to: Home              Patient currently is not medically stable to d/c.   Difficult to place patient No   Consultants: id  Procedures: none Antimicrobials: none  Subjective:  Having fever at  home  C/o sob   Objective: Vitals:   03/25/21 0704 03/25/21 0848 03/25/21 0924 03/25/21 1156  BP:   (!) 157/56 (!) 114/54  Pulse:   86 79  Resp:   20 19  Temp:  99.6 F (37.6 C) (!) 101.2 F (38.4 C) 98.1 F (36.7 C)  TempSrc:  Oral Oral Oral  SpO2: 95%  96% 93%  Weight:      Height:        Intake/Output Summary (Last 24 hours) at 03/25/2021 1417 Last data filed at 03/25/2021 1000 Gross per 24 hour  Intake 120 ml  Output 400 ml  Net -280 ml   Filed Weights    03/23/21 1234 03/24/21 2133 03/25/21 0500  Weight: 105.7 kg 106.9 kg 106.3 kg    Examination:  General exam: in nad  Respiratory system: rhonchi b/l to auscultation. Respiratory effort normal. Cardiovascular system: S1 & S2 heard, RRR. No JVD, murmurs, rubs, gallops or clicks. No pedal edema. Gastrointestinal system: Abdomen is nondistended, soft and nontender. No organomegaly or masses felt. Normal bowel sounds heard. Central nervous system: Alert and oriented. No focal neurological deficits. Extremities: Symmetric 5 x 5 power. Skin: No rashes, lesions or ulcers Psychiatry: Judgement and insight appear normal. Mood & affect appropriate.     Data Reviewed: I have personally reviewed following labs and imaging studies  CBC: Recent Labs  Lab 03/20/21 1220 03/23/21 1301  WBC 7.6 8.6  NEUTROABS  --  6.0  HGB 9.5* 9.3*  HCT 30.6* 32.1*  MCV 60.5 Repeated and verified X2.* 63.6*  PLT 353.0 027    Basic Metabolic Panel: Recent Labs  Lab 03/20/21 1220 03/23/21 1301  NA 137 137  K 4.0 3.3*  CL 99 102  CO2 26 27  GLUCOSE 135* 115*  BUN 15 14  CREATININE 1.19 1.05*  CALCIUM 9.5 9.0    GFR: Estimated Creatinine Clearance: 61.8 mL/min (A) (by C-G formula based on SCr of 1.05 mg/dL (H)). Liver Function Tests: Recent Labs  Lab 03/20/21 1220 03/23/21 1301  AST 19 23  ALT 28 28  ALKPHOS 80 69  BILITOT 0.5 0.4  PROT 7.2 7.4  ALBUMIN 4.1 3.5    No results for input(s): LIPASE, AMYLASE in the last 168 hours. No results for input(s): AMMONIA in the last 168 hours. Coagulation Profile: No results for input(s): INR, PROTIME in the last 168 hours. Cardiac Enzymes: Recent Labs  Lab 03/25/21 1126  CKTOTAL 213   BNP (last 3 results) Recent Labs    03/20/21 1220  PROBNP 57.0    HbA1C: No results for input(s): HGBA1C in the last 72 hours. CBG: Recent Labs  Lab 03/24/21 1206 03/24/21 1838 03/24/21 2140 03/25/21 0740 03/25/21 1150  GLUCAP 284* 282* 291* 240*  380*    Lipid Profile: No results for input(s): CHOL, HDL, LDLCALC, TRIG, CHOLHDL, LDLDIRECT in the last 72 hours. Thyroid Function Tests: No results for input(s): TSH, T4TOTAL, FREET4, T3FREE, THYROIDAB in the last 72 hours. Anemia Panel: Recent Labs    03/25/21 1126  FERRITIN 130   Sepsis Labs: Recent Labs  Lab 03/24/21 0225  PROCALCITON <0.10     Recent Results (from the past 240 hour(s))  Resp Panel by RT-PCR (Flu A&B, Covid) Nasopharyngeal Swab     Status: None   Collection Time: 03/23/21  5:32 PM   Specimen: Nasopharyngeal Swab; Nasopharyngeal(NP) swabs in vial transport medium  Result Value Ref Range Status   SARS Coronavirus 2 by RT PCR NEGATIVE NEGATIVE Final  Comment: (NOTE) SARS-CoV-2 target nucleic acids are NOT DETECTED.  The SARS-CoV-2 RNA is generally detectable in upper respiratory specimens during the acute phase of infection. The lowest concentration of SARS-CoV-2 viral copies this assay can detect is 138 copies/mL. A negative result does not preclude SARS-Cov-2 infection and should not be used as the sole basis for treatment or other patient management decisions. A negative result may occur with  improper specimen collection/handling, submission of specimen other than nasopharyngeal swab, presence of viral mutation(s) within the areas targeted by this assay, and inadequate number of viral copies(<138 copies/mL). A negative result must be combined with clinical observations, patient history, and epidemiological information. The expected result is Negative.  Fact Sheet for Patients:  EntrepreneurPulse.com.au  Fact Sheet for Healthcare Providers:  IncredibleEmployment.be  This test is no t yet approved or cleared by the [place] FDA and  has been authorized for detection and/or diagnosis of SARS-CoV-2 by FDA under an Emergency Use Authorization (EUA). This EUA will remain  in effect (meaning this test can be  used) for the duration of the COVID-19 declaration under Section 564(b)(1) of the Act, 21 U.S.C.section 360bbb-3(b)(1), unless the authorization is terminated  or revoked sooner.       Influenza A by PCR NEGATIVE NEGATIVE Final   Influenza B by PCR NEGATIVE NEGATIVE Final    Comment: (NOTE) The Xpert Xpress SARS-CoV-2/FLU/RSV plus assay is intended as an aid in the diagnosis of influenza from Nasopharyngeal swab specimens and should not be used as a sole basis for treatment. Nasal washings and aspirates are unacceptable for Xpert Xpress SARS-CoV-2/FLU/RSV testing.  Fact Sheet for Patients: EntrepreneurPulse.com.au  Fact Sheet for Healthcare Providers: IncredibleEmployment.be  This test is not yet approved or cleared by the [place] FDA and has been authorized for detection and/or diagnosis of SARS-CoV-2 by FDA under an Emergency Use Authorization (EUA). This EUA will remain in effect (meaning this test can be used) for the duration of the COVID-19 declaration under Section 564(b)(1) of the Act, 21 U.S.C. section 360bbb-3(b)(1), unless the authorization is terminated or revoked.  Performed at [facility], [place] [address] [place], [place] [address]   Urine culture     Status: Abnormal   Collection Time: 03/23/21  5:32 PM   Specimen: Urine, Clean Catch  Result Value Ref Range Status   Specimen Description   Final    URINE, CLEAN CATCH Performed at [facility], [place] [address] [place], [place] [address]    Special Requests   Final    NONE Performed at [facility], [place] [address], [place], [place] [address]    Culture (A)  Final    <10,000 COLONIES/mL INSIGNIFICANT GROWTH Performed at [place] [address], [place], [place] [address]    Report Status 03/25/2021 FINAL  Final  Culture, blood (Routine X 2) w Reflex to ID Panel     Status: None (Preliminary result)    Collection Time: 03/24/21  2:25 AM   Specimen: BLOOD  Result Value Ref Range Status   Specimen Description   Final    BLOOD BLOOD LEFT FOREARM Performed at [place] [address] [place], [place] [address]    Special Requests   Final    BOTTLES DRAWN AEROBIC AND ANAEROBIC Blood Culture adequate volume Performed at [place] [address] [place], [place] [address]    Culture   Final    NO GROWTH 1 DAY Performed  at [facility], [place] [address], [place], [place] [address]    Report Status PENDING  Incomplete  Culture, blood (Routine X 2) w Reflex to ID Panel     Status: None (Preliminary result)   Collection Time: 03/24/21  2:30 AM   Specimen: BLOOD  Result Value Ref Range Status   Specimen Description   Final    BLOOD BLOOD RIGHT HAND Performed at [place] [address] [place], [place] [address]    Special Requests   Final    BOTTLES DRAWN AEROBIC AND ANAEROBIC Blood Culture adequate volume Performed at [place] [address] [place], [place] [address]    Culture   Final    NO GROWTH 1 DAY Performed at [facility], [place] [address] [place], [place] [address]    Report Status PENDING  Incomplete  Culture, blood (routine x 2)     Status: None (Preliminary result)   Collection Time: 03/24/21 11:30 AM   Specimen: BLOOD  Result Value Ref Range Status   Specimen Description   Final    BLOOD LEFT ANTECUBITAL Performed at [facility], [place] [address] [place], [place] [address]    Special Requests   Final    BOTTLES DRAWN AEROBIC AND ANAEROBIC Blood Culture adequate volume Performed at [place] [address], [place], [place] [address]    Culture   Final    NO GROWTH < 12 HOURS Performed at [place] [address], [place], [place] [address]    Report Status PENDING  Incomplete          Radiology Studies: CT  Abdomen Pelvis Wo Contrast  Result Date: 03/23/2021 CLINICAL DATA:  Cough, nausea for 3 weeks.  Abdomen pain and fever. EXAM: CT ABDOMEN AND PELVIS WITHOUT CONTRAST TECHNIQUE: Multidetector CT imaging of the abdomen and pelvis was performed following the standard protocol without IV contrast. COMPARISON:  None. FINDINGS: Lower chest: No acute abnormality. Dense calcification of mitral valvular ring is identified. Hepatobiliary: There is mild diffuse low density of the liver. No focal lesion is identified in the liver. The gallbladder and biliary tree are normal. Pancreas: Unremarkable. No pancreatic ductal dilatation or surrounding inflammatory changes. Spleen: Normal in size without focal abnormality. Adrenals/Urinary Tract: Adrenal glands are unremarkable. Kidneys are normal, without renal calculi, focal lesion, or hydronephrosis. Bladder is unremarkable. Stomach/Bowel: Stomach is within normal limits. The appendix is not definitely seen but no inflammation is noted around cecum. No evidence of bowel wall thickening, distention, or inflammatory changes. There is diverticulosis of colon. Vascular/Lymphatic: Aortic atherosclerosis. No enlarged abdominal or pelvic lymph nodes. Reproductive: Status post hysterectomy. No adnexal masses. Other: Small umbilical herniation of mesenteric fat is noted. Musculoskeletal: Degenerative joint changes of the spine are identified. IMPRESSION: 1. No acute abnormality identified in the abdomen and pelvis. 2. Fatty infiltration of liver. 3. Diverticulosis of colon without diverticulitis. 4. Aortic atherosclerosis. Aortic Atherosclerosis (ICD10-I70.0). Electronically Signed   By: Abelardo Diesel M.D.   On: 03/23/2021 18:47   DG Chest 2 View  Result Date: 03/25/2021 CLINICAL DATA:  Fever and cough. EXAM: CHEST - 2 VIEW COMPARISON:  Chest x-ray 03/23/2021 and chest CT 03/21/2021 FINDINGS: Borderline heart size is stable. The mediastinal and hilar contours are stable. Persistent  patchy nodular interstitial process in the lungs without discrete focal pneumonia. No pleural effusion or pneumothorax. IMPRESSION: Persistent patchy nodular interstitial process in the lungs. Findings could be due  to atypical/viral pneumonia, reactive airways disease, hypersensitivity pneumonitis or cryptogenic organizing pneumonia. Electronically Signed   By: Marijo Sanes M.D.   On: 03/25/2021 14:14   ECHOCARDIOGRAM COMPLETE  Result Date: 03/24/2021    ECHOCARDIOGRAM REPORT   Patient Name:   ELIYANNA AULT Date of Exam: 03/24/2021 Medical Rec #:  [id number]               Height:       64.0 in Accession #:    [id number]              Weight:       233.0 lb Date of Birth:  April 16, 1956                BSA:          2.088 m Patient Age:    47 years                BP:           155/70 mmHg Patient Gender: F                       HR:           80 bpm. Exam Location:  Inpatient Procedure: 2D Echo, Cardiac Doppler and Color Doppler Indications:    Fever R50.9  History:        Patient has prior history of Echocardiogram examinations, most                 recent 02/09/2020. CAD and Angina, Signs/Symptoms:Murmur and                 Syncope; Risk Factors:Hypertension, Diabetes, Dyslipidemia and                 Sleep Apnea.  Sonographer:    Jonelle Sidle Dance Referring Phys: [id number] Rhetta Mura  Sonographer Comments: Reading Cardiologist notified. IMPRESSIONS  1. Left ventricular ejection fraction, by estimation, is 60 to 65%. The left ventricle has normal function. The left ventricle has no regional wall motion abnormalities. There is moderate left ventricular hypertrophy of the basal-septal segment. Left ventricular diastolic parameters are consistent with Grade II diastolic dysfunction (pseudonormalization). Elevated left ventricular end-diastolic pressure.  2. Right ventricular systolic function is normal. The right ventricular size is normal. There is mildly elevated pulmonary artery systolic pressure. The  estimated right ventricular systolic pressure is 50.5 mmHg.  3. Left atrial size was mildly dilated.  4. The mitral valve is abnormal. There is an ill defined density measuring 3.23 x 2.10cm that appears to originate off the posterior MV leaflet but cannot be definite on this. There is also mitral annular calcification present. There is moderate mitral stenosis with a mean MVG of 56mmHg. No evidence of mitral valve regurgitation.  5. The aortic valve is normal in structure. Aortic valve regurgitation is not visualized. No aortic stenosis is present.  6. The inferior vena cava is normal in size with greater than 50% respiratory variability, suggesting right atrial pressure of 3 mmHg.  7. Recommend TEE for possible mitral valve endocarditis in setting of abnormal mitral valve and fevers. FINDINGS  Left Ventricle: Left ventricular ejection fraction, by estimation, is 60 to 65%. The left ventricle has normal function. The left ventricle has no regional wall motion abnormalities. The left ventricular internal cavity size was normal in size. There is  moderate left ventricular hypertrophy of the basal-septal segment. Left ventricular diastolic parameters are consistent with Grade II  diastolic dysfunction (pseudonormalization). Elevated left ventricular end-diastolic pressure. Right Ventricle: The right ventricular size is normal. No increase in right ventricular wall thickness. Right ventricular systolic function is normal. There is mildly elevated pulmonary artery systolic pressure. The tricuspid regurgitant velocity is 2.93  m/s, and with an assumed right atrial pressure of 3 mmHg, the estimated right ventricular systolic pressure is 15.1 mmHg. Left Atrium: Left atrial size was mildly dilated. Right Atrium: Right atrial size was normal in size. Pericardium: There is no evidence of pericardial effusion. Mitral Valve: There is an ill defined density measuring 3.23 x 2.10cm that appears to originate off the posterior MV  leaflet but cannot be definite on this. There is also mitral annular calcification present. There is moderate mitral stenosis with a mean  MVG of 51mmHg. The mitral valve is abnormal. Severe mitral annular calcification. No evidence of mitral valve regurgitation. Moderate mitral valve stenosis. MV peak gradient, 18.8 mmHg. The mean mitral valve gradient is 9.0 mmHg. Tricuspid Valve: The tricuspid valve is normal in structure. Tricuspid valve regurgitation is mild . No evidence of tricuspid stenosis. Aortic Valve: The aortic valve is normal in structure. Aortic valve regurgitation is not visualized. No aortic stenosis is present. Pulmonic Valve: The pulmonic valve was normal in structure. Pulmonic valve regurgitation is not visualized. No evidence of pulmonic stenosis. Aorta: The aortic root is normal in size and structure. Venous: The inferior vena cava is normal in size with greater than 50% respiratory variability, suggesting right atrial pressure of 3 mmHg. IAS/Shunts: No atrial level shunt detected by color flow Doppler.  LEFT VENTRICLE PLAX 2D LVIDd:         3.00 cm  Diastology LVIDs:         1.70 cm  LV e' medial:    6.42 cm/s LV PW:         2.10 cm  LV E/e' medial:  25.9 LV IVS:        1.40 cm  LV e' lateral:   7.51 cm/s LVOT diam:     1.90 cm  LV E/e' lateral: 22.1 LV SV:         59 LV SV Index:   28 LVOT Area:     2.84 cm  RIGHT VENTRICLE             IVC RV Basal diam:  2.80 cm     IVC diam: 1.70 cm RV S prime:     19.60 cm/s TAPSE (M-mode): 2.1 cm LEFT ATRIUM             Index       RIGHT ATRIUM          Index LA diam:        4.20 cm 2.01 cm/m  RA Area:     9.78 cm LA Vol (A2C):   82.4 ml 39.47 ml/m RA Volume:   20.10 ml 9.63 ml/m LA Vol (A4C):   71.0 ml 34.01 ml/m LA Biplane Vol: 76.5 ml 36.65 ml/m  AORTIC VALVE LVOT Vmax:   116.00 cm/s LVOT Vmean:  75.000 cm/s LVOT VTI:    0.207 m  AORTA Ao Root diam: 3.20 cm Ao Asc diam:  3.10 cm MITRAL VALVE                TRICUSPID VALVE MV Area (PHT): 2.76 cm      TR Peak grad:   34.3 mmHg MV Area VTI:   0.89 cm     TR Vmax:  293.00 cm/s MV Peak grad:  18.8 mmHg MV Mean grad:  9.0 mmHg     SHUNTS MV Vmax:       2.17 m/s     Systemic VTI:  0.21 m MV Vmean:      147.0 cm/s   Systemic Diam: 1.90 cm MV Decel Time: 275 msec MV E velocity: 166.00 cm/s MV A velocity: 195.00 cm/s MV E/A ratio:  0.85 Fransico Him MD Electronically signed by Fransico Him MD Signature Date/Time: 03/24/2021/12:59:52 PM    Final         Scheduled Meds:  atorvastatin  80 mg Oral q1800   azithromycin  500 mg Oral Daily   fluticasone  1 spray Each Nare Daily   insulin aspart  0-9 Units Subcutaneous TID WC   ipratropium-albuterol  3 mL Nebulization BID   loratadine  10 mg Oral Daily   losartan  100 mg Oral Daily   multivitamin with minerals  1 tablet Oral Daily   Continuous Infusions:   LOS: 2 days    Time spent: 38 min  Georgette Shell, MD 03/25/2021, 2:17 PM

## 2021-03-25 NOTE — Progress Notes (Signed)
SATURATION QUALIFICATIONS: (This note is used to comply with regulatory documentation for home oxygen)    Patient Saturations on Room Air while Ambulating = 86%  Patient Saturations on 2 Liters of oxygen while Ambulating = 94%  Please briefly explain why patient needs home oxygen:Pt de-sat on room air with mobilization

## 2021-03-25 NOTE — Evaluation (Signed)
Physical Therapy Evaluation Patient Details Name: Lynn [name] MRN: [id number] DOB: 1956-05-27 Today's Date: 03/25/2021   History of Present Illness  Lynn [name] is a 65 y.o. female who has history of HTN, IDDM, RA on methotrexate, CAD who was admitted for worsening fever and shortness of breath.  Clinical Impression  Pt admitted with above diagnosis.  Pt currently with functional limitations due to the deficits listed below (see PT Problem List). Pt will benefit from skilled PT to increase their independence and safety with mobility to allow discharge to the venue listed below.  Pt o2 86% on RA with walking back from the bathroom. Amb on 2 L/min without AD and o2 was 94%.  She would benefit from a 3-1 BSC to use over her toilet at home.  Will follow acutely, but no follow up PT needed at time of d/c.     Follow Up Recommendations No PT follow up    Equipment Recommendations  3in1 (PT)    Recommendations for Other Services       Precautions / Restrictions Precautions Precautions: Other (comment) Precaution Comments: monitor o2 Restrictions Weight Bearing Restrictions: No      Mobility  Bed Mobility Overal bed mobility: Independent             General bed mobility comments: Pt up in bathroom upon arrival    Transfers Overall transfer level: Modified independent Equipment used: None             General transfer comment: Pt able to stand up from toilet without A and with S from bedside.  Ambulation/Gait Ambulation/Gait assistance: Min guard Gait Distance (Feet): 150 Feet Assistive device: None (occasional use of hand rail)   Gait velocity: decreased   General Gait Details: Amb from bathroom to bed on room air and was 86%.  Amb in hallway on 2 L with o2 sat 94% and 4 standing rest breaks.  Stairs            Wheelchair Mobility    Modified Rankin (Stroke Patients Only)       Balance Overall balance assessment: Mild deficits  observed, not formally tested                                           Pertinent Vitals/Pain Pain Assessment: No/denies pain    Home Living Family/patient expects to be discharged to:: Private residence Living Arrangements: Spouse/significant other Available Help at Discharge: Family;Available 24 hours/day Type of Home: House Home Access: Stairs to enter Entrance Stairs-Rails: Can reach both Entrance Stairs-Number of Steps: 3 Home Layout: Two level Home Equipment: Walker - 2 wheels;Shower seat      Prior Function Level of Independence: Independent               Hand Dominance        Extremity/Trunk Assessment   Upper Extremity Assessment Upper Extremity Assessment: Overall WFL for tasks assessed    Lower Extremity Assessment Lower Extremity Assessment: Overall WFL for tasks assessed;Generalized weakness       Communication   Communication: No difficulties  Cognition Arousal/Alertness: Awake/alert Behavior During Therapy: WFL for tasks assessed/performed Overall Cognitive Status: Within Functional Limits for tasks assessed  General Comments      Exercises     Assessment/Plan    PT Assessment Patient needs continued PT services  PT Problem List Decreased strength;Decreased activity tolerance;Decreased mobility;Decreased knowledge of use of DME;Cardiopulmonary status limiting activity       PT Treatment Interventions DME instruction;Gait training;Functional mobility training;Balance training;Therapeutic exercise;Therapeutic activities;Stair training    PT Goals (Current goals can be found in the Care Plan section)  Acute Rehab PT Goals Patient Stated Goal: feel better PT Goal Formulation: With patient Time For Goal Achievement: 04/08/21 Potential to Achieve Goals: Good    Frequency Min 3X/week   Barriers to discharge        Co-evaluation               AM-PAC PT  "6 Clicks" Mobility  Outcome Measure Help needed turning from your back to your side while in a flat bed without using bedrails?: None Help needed moving from lying on your back to sitting on the side of a flat bed without using bedrails?: None Help needed moving to and from a bed to a chair (including a wheelchair)?: None Help needed standing up from a chair using your arms (e.g., wheelchair or bedside chair)?: None Help needed to walk in hospital room?: A Little Help needed climbing 3-5 steps with a railing? : A Little 6 Click Score: 22    End of Session Equipment Utilized During Treatment: Oxygen Activity Tolerance: Patient tolerated treatment well Patient left: in chair;with call bell/phone within reach;Other (comment) (with lab) Nurse Communication: Mobility status PT Visit Diagnosis: Difficulty in walking, not elsewhere classified (R26.2)    Time: 3734-2876 PT Time Calculation (min) (ACUTE ONLY): 24 min   Charges:   PT Evaluation $PT Eval Low Complexity: 1 Low PT Treatments $Gait Training: 8-22 mins        Candido Flott L. Tamala Julian, PT  03/25/2021   Galen Manila 03/25/2021, 12:54 PM

## 2021-03-26 ENCOUNTER — Inpatient Hospital Stay (HOSPITAL_COMMUNITY): Payer: Medicare Other

## 2021-03-26 ENCOUNTER — Other Ambulatory Visit: Payer: Self-pay | Admitting: Family Medicine

## 2021-03-26 DIAGNOSIS — I5032 Chronic diastolic (congestive) heart failure: Secondary | ICD-10-CM

## 2021-03-26 DIAGNOSIS — I10 Essential (primary) hypertension: Secondary | ICD-10-CM

## 2021-03-26 DIAGNOSIS — I330 Acute and subacute infective endocarditis: Secondary | ICD-10-CM

## 2021-03-26 LAB — COMPREHENSIVE METABOLIC PANEL
ALT: 18 U/L (ref 0–44)
AST: 18 U/L (ref 15–41)
Albumin: 3.2 g/dL — ABNORMAL LOW (ref 3.5–5.0)
Alkaline Phosphatase: 62 U/L (ref 38–126)
Anion gap: 8 (ref 5–15)
BUN: 15 mg/dL (ref 8–23)
CO2: 24 mmol/L (ref 22–32)
Calcium: 8.7 mg/dL — ABNORMAL LOW (ref 8.9–10.3)
Chloride: 100 mmol/L (ref 98–111)
Creatinine, Ser: 1.13 mg/dL — ABNORMAL HIGH (ref 0.44–1.00)
GFR, Estimated: 53 mL/min — ABNORMAL LOW (ref >60)
Glucose, Bld: 227 mg/dL — ABNORMAL HIGH (ref 70–99)
Potassium: 4 mmol/L (ref 3.5–5.1)
Sodium: 132 mmol/L — ABNORMAL LOW (ref 135–145)
Total Bilirubin: 0.7 mg/dL (ref 0.3–1.2)
Total Protein: 7.1 g/dL (ref 6.5–8.1)

## 2021-03-26 LAB — RESPIRATORY PANEL BY PCR

## 2021-03-26 LAB — D-DIMER, QUANTITATIVE: D-Dimer, Quant: 2.23 ug/mL-FEU — ABNORMAL HIGH (ref 0.00–0.50)

## 2021-03-26 LAB — CBC
HCT: 27.3 % — ABNORMAL LOW (ref 36.0–46.0)
Hemoglobin: 8.1 g/dL — ABNORMAL LOW (ref 12.0–15.0)
MCH: 18.7 pg — ABNORMAL LOW (ref 26.0–34.0)
MCHC: 29.7 g/dL — ABNORMAL LOW (ref 30.0–36.0)
MCV: 63 fL — ABNORMAL LOW (ref 80.0–100.0)
Platelets: 349 10*3/uL (ref 150–400)
RBC: 4.33 MIL/uL (ref 3.87–5.11)
RDW: 18.1 % — ABNORMAL HIGH (ref 11.5–15.5)
WBC: 14.6 10*3/uL — ABNORMAL HIGH (ref 4.0–10.5)
nRBC: 0 % (ref 0.0–0.2)

## 2021-03-26 LAB — GLUCOSE, CAPILLARY
Glucose-Capillary: 222 mg/dL — ABNORMAL HIGH (ref 70–99)
Glucose-Capillary: 336 mg/dL — ABNORMAL HIGH (ref 70–99)
Glucose-Capillary: 445 mg/dL — ABNORMAL HIGH (ref 70–99)
Glucose-Capillary: 448 mg/dL — ABNORMAL HIGH (ref 70–99)
Glucose-Capillary: 407 mg/dL — ABNORMAL HIGH (ref 70–99)

## 2021-03-26 LAB — RPR: RPR Ser Ql: NONREACTIVE

## 2021-03-26 LAB — BRAIN NATRIURETIC PEPTIDE: B Natriuretic Peptide: 152.5 pg/mL — ABNORMAL HIGH (ref 0.0–100.0)

## 2021-03-26 LAB — SEDIMENTATION RATE: Sed Rate: 68 mm/hr — ABNORMAL HIGH (ref 0–22)

## 2021-03-26 LAB — C-REACTIVE PROTEIN: CRP: 9.5 mg/dL — ABNORMAL HIGH (ref <1.0)

## 2021-03-26 MED ORDER — ARFORMOTEROL TARTRATE 15 MCG/2ML IN NEBU
15.0000 ug | INHALATION_SOLUTION | Freq: Two times a day (BID) | RESPIRATORY_TRACT | Status: DC
  Administered 2021-03-26 – 2021-04-03 (×16): 15 ug via RESPIRATORY_TRACT
  Filled 2021-03-26 (×17): qty 2

## 2021-03-26 MED ORDER — HYDROCODONE BIT-HOMATROP MBR 5-1.5 MG/5ML PO SOLN
5.0000 mL | Freq: Four times a day (QID) | ORAL | Status: DC | PRN
  Administered 2021-03-27 – 2021-04-02 (×8): 5 mL via ORAL
  Filled 2021-03-26 (×9): qty 5

## 2021-03-26 MED ORDER — GUAIFENESIN-DM 100-10 MG/5ML PO SYRP
5.0000 mL | ORAL_SOLUTION | ORAL | Status: DC | PRN
  Administered 2021-03-26 – 2021-03-29 (×5): 5 mL via ORAL
  Filled 2021-03-26 (×5): qty 10

## 2021-03-26 MED ORDER — INSULIN ASPART 100 UNIT/ML IJ SOLN
20.0000 [IU] | Freq: Once | INTRAMUSCULAR | Status: AC
  Administered 2021-03-26: 20 [IU] via SUBCUTANEOUS

## 2021-03-26 MED ORDER — INSULIN ASPART 100 UNIT/ML IJ SOLN
8.0000 [IU] | Freq: Once | INTRAMUSCULAR | Status: AC
  Administered 2021-03-26: 8 [IU] via SUBCUTANEOUS

## 2021-03-26 MED ORDER — INSULIN GLARGINE 100 UNIT/ML ~~LOC~~ SOLN
15.0000 [IU] | Freq: Every day | SUBCUTANEOUS | Status: DC
  Administered 2021-03-26: 15 [IU] via SUBCUTANEOUS
  Filled 2021-03-26: qty 0.15

## 2021-03-26 MED ORDER — INSULIN GLARGINE 100 UNIT/ML ~~LOC~~ SOLN
12.0000 [IU] | Freq: Every day | SUBCUTANEOUS | Status: DC
  Filled 2021-03-26: qty 0.12

## 2021-03-26 MED ORDER — REVEFENACIN 175 MCG/3ML IN SOLN
175.0000 ug | Freq: Every day | RESPIRATORY_TRACT | Status: DC
  Administered 2021-03-26 – 2021-04-03 (×8): 175 ug via RESPIRATORY_TRACT
  Filled 2021-03-26 (×9): qty 3

## 2021-03-26 MED ORDER — INSULIN ASPART 100 UNIT/ML IJ SOLN
6.0000 [IU] | Freq: Three times a day (TID) | INTRAMUSCULAR | Status: DC
  Administered 2021-03-26 – 2021-03-29 (×6): 6 [IU] via SUBCUTANEOUS

## 2021-03-26 MED ORDER — DILTIAZEM HCL 60 MG PO TABS
60.0000 mg | ORAL_TABLET | Freq: Three times a day (TID) | ORAL | Status: DC
  Administered 2021-03-26 – 2021-03-31 (×15): 60 mg via ORAL
  Filled 2021-03-26 (×15): qty 1

## 2021-03-26 MED ORDER — METHYLPREDNISOLONE SODIUM SUCC 40 MG IJ SOLR
40.0000 mg | Freq: Four times a day (QID) | INTRAMUSCULAR | Status: DC
  Administered 2021-03-26 – 2021-03-27 (×5): 40 mg via INTRAVENOUS
  Filled 2021-03-26 (×5): qty 1

## 2021-03-26 MED ORDER — GADOBUTROL 1 MMOL/ML IV SOLN
10.0000 mL | Freq: Once | INTRAVENOUS | Status: AC | PRN
  Administered 2021-03-26: 10 mL via INTRAVENOUS

## 2021-03-26 NOTE — Progress Notes (Signed)
Subjective:  Still having fevers and also having some headaches  Antibiotics:  Anti-infectives (From admission, onward)    Start     Dose/Rate Route Frequency Ordered Stop   03/26/21 1600  vancomycin (VANCOREADY) IVPB 1000 mg/200 mL        1,000 mg 200 mL/hr over 60 Minutes Intravenous Every 24 hours 03/25/21 1514     03/25/21 1700  cefTRIAXone (ROCEPHIN) 2 g in sodium chloride 0.9 % 100 mL IVPB        2 g 200 mL/hr over 30 Minutes Intravenous Every 12 hours 03/25/21 1429     03/25/21 1600  vancomycin (VANCOCIN) 2,250 mg in sodium chloride 0.9 % 500 mL IVPB        2,250 mg 250 mL/hr over 120 Minutes Intravenous STAT 03/25/21 1503 03/25/21 1815   03/25/21 1515  cefTRIAXone (ROCEPHIN) 1 g in sodium chloride 0.9 % 100 mL IVPB  Status:  Discontinued        1 g 200 mL/hr over 30 Minutes Intravenous Every 24 hours 03/25/21 1421 03/25/21 1429   03/24/21 1530  azithromycin (ZITHROMAX) tablet 500 mg        500 mg Oral Daily 03/24/21 1525     03/24/21 0145  doxycycline (VIBRA-TABS) tablet 100 mg  Status:  Discontinued       Note to Pharmacy: Patient taking differently: Start date : 03/20/21     100 mg Oral 2 times daily 03/24/21 0131 03/24/21 1038       Medications: Scheduled Meds:  arformoterol  15 mcg Nebulization BID   atorvastatin  80 mg Oral q1800   azithromycin  500 mg Oral Daily   diltiazem  60 mg Oral Q8H   fluticasone  1 spray Each Nare Daily   insulin aspart  0-20 Units Subcutaneous TID WC   insulin aspart  0-5 Units Subcutaneous QHS   insulin aspart  6 Units Subcutaneous TID WC   insulin glargine  12 Units Subcutaneous QHS   loratadine  10 mg Oral Daily   losartan  100 mg Oral Daily   methylPREDNISolone (SOLU-MEDROL) injection  40 mg Intravenous Q6H   multivitamin with minerals  1 tablet Oral Daily   revefenacin  175 mcg Nebulization Daily   Continuous Infusions:  cefTRIAXone (ROCEPHIN)  IV 2 g (03/26/21 0756)   vancomycin     PRN Meds:.acetaminophen  **OR** acetaminophen, albuterol, guaiFENesin-dextromethorphan, HYDROcodone bit-homatropine, ondansetron (ZOFRAN) IV    Objective: Weight change:   Intake/Output Summary (Last 24 hours) at 03/26/2021 1417 Last data filed at 03/26/2021 0800 Gross per 24 hour  Intake 720 ml  Output 1000 ml  Net -280 ml    Blood pressure 117/62, pulse 80, temperature 98 F (36.7 C), temperature source Oral, resp. rate 14, height 5\' 4"  (1.626 m), weight 106.3 kg, SpO2 90 %. Temp:  [95 F (35 C)-100.1 F (37.8 C)] 98 F (36.7 C) (06/12 1343) Pulse Rate:  [80-97] 80 (06/12 1343) Resp:  [14-20] 14 (06/12 1343) BP: (117-151)/(59-83) 117/62 (06/12 1343) SpO2:  [90 %-98 %] 90 % (06/12 1343)  Physical Exam: Physical Exam Constitutional:      General: She is not in acute distress.    Appearance: She is well-developed. She is not diaphoretic.  HENT:     Head: Normocephalic and atraumatic.     Right Ear: External ear normal.     Left Ear: External ear normal.     Mouth/Throat:     Lips: Pink.  Mouth: Mucous membranes are moist. Mucous membranes are pale.     Dentition: Normal dentition. No dental tenderness or gum lesions.     Pharynx: Oropharynx is clear. No pharyngeal swelling, oropharyngeal exudate or posterior oropharyngeal erythema.     Comments: White coating to tongue Eyes:     General: No scleral icterus.    Extraocular Movements: Extraocular movements intact.     Conjunctiva/sclera: Conjunctivae normal.     Pupils: Pupils are equal, round, and reactive to light.  Cardiovascular:     Rate and Rhythm: Normal rate and regular rhythm.     Heart sounds: Normal heart sounds. No murmur heard.   No friction rub. No gallop.  Pulmonary:     Effort: Pulmonary effort is normal. No respiratory distress.     Breath sounds: Normal breath sounds. No stridor. No wheezing, rhonchi or rales.  Abdominal:     General: Bowel sounds are normal. There is no distension.     Palpations: Abdomen is soft.      Tenderness: There is no abdominal tenderness.  Musculoskeletal:        General: No tenderness. Normal range of motion.  Lymphadenopathy:     Cervical: No cervical adenopathy.  Skin:    General: Skin is warm and dry.     Coloration: Skin is not pale.     Findings: No abrasion, abscess, erythema or rash.     Comments: Feet without any ulcerations or lesions  Neurological:     General: No focal deficit present.     Mental Status: She is alert and oriented to person, place, and time.     Motor: No abnormal muscle tone.     Coordination: Coordination normal.  Psychiatric:        Mood and Affect: Mood normal.        Behavior: Behavior normal.        Thought Content: Thought content normal.        Judgment: Judgment normal.     CBC:    BMET Recent Labs    03/26/21 0402  NA 132*  K 4.0  CL 100  CO2 24  GLUCOSE 227*  BUN 15  CREATININE 1.13*  CALCIUM 8.7*      Liver Panel  Recent Labs    03/26/21 0402  PROT 7.1  ALBUMIN 3.2*  AST 18  ALT 18  ALKPHOS 62  BILITOT 0.7        Sedimentation Rate Recent Labs    03/26/21 0402  ESRSEDRATE 68*    C-Reactive Protein Recent Labs    03/24/21 0225 03/26/21 0357  CRP 2.8* 9.5*     Micro Results: Recent Results (from the past 720 hour(s))  Resp Panel by RT-PCR (Flu A&B, Covid) Nasopharyngeal Swab     Status: None   Collection Time: 03/23/21  5:32 PM   Specimen: Nasopharyngeal Swab; Nasopharyngeal(NP) swabs in vial transport medium  Result Value Ref Range Status   SARS Coronavirus 2 by RT PCR NEGATIVE NEGATIVE Final    Comment: (NOTE) SARS-CoV-2 target nucleic acids are NOT DETECTED.  The SARS-CoV-2 RNA is generally detectable in upper respiratory specimens during the acute phase of infection. The lowest concentration of SARS-CoV-2 viral copies this assay can detect is 138 copies/mL. A negative result does not preclude SARS-Cov-2 infection and should not be used as the sole basis for treatment or other  patient management decisions. A negative result may occur with  improper specimen collection/handling, submission of specimen other than nasopharyngeal swab, presence  of viral mutation(s) within the areas targeted by this assay, and inadequate number of viral copies(<138 copies/mL). A negative result must be combined with clinical observations, patient history, and epidemiological information. The expected result is Negative.  Fact Sheet for Patients:  EntrepreneurPulse.com.au  Fact Sheet for Healthcare Providers:  IncredibleEmployment.be  This test is no t yet approved or cleared by the [place] FDA and  has been authorized for detection and/or diagnosis of SARS-CoV-2 by FDA under an Emergency Use Authorization (EUA). This EUA will remain  in effect (meaning this test can be used) for the duration of the COVID-19 declaration under Section 564(b)(1) of the Act, 21 U.S.C.section 360bbb-3(b)(1), unless the authorization is terminated  or revoked sooner.       Influenza A by PCR NEGATIVE NEGATIVE Final   Influenza B by PCR NEGATIVE NEGATIVE Final    Comment: (NOTE) The Xpert Xpress SARS-CoV-2/FLU/RSV plus assay is intended as an aid in the diagnosis of influenza from Nasopharyngeal swab specimens and should not be used as a sole basis for treatment. Nasal washings and aspirates are unacceptable for Xpert Xpress SARS-CoV-2/FLU/RSV testing.  Fact Sheet for Patients: EntrepreneurPulse.com.au  Fact Sheet for Healthcare Providers: IncredibleEmployment.be  This test is not yet approved or cleared by the [place] FDA and has been authorized for detection and/or diagnosis of SARS-CoV-2 by FDA under an Emergency Use Authorization (EUA). This EUA will remain in effect (meaning this test can be used) for the duration of the COVID-19 declaration under Section 564(b)(1) of the Act, 21 U.S.C. section  360bbb-3(b)(1), unless the authorization is terminated or revoked.  Performed at [facility], [place] [address] [place], [place] [address]   Urine culture     Status: Abnormal   Collection Time: 03/23/21  5:32 PM   Specimen: Urine, Clean Catch  Result Value Ref Range Status   Specimen Description   Final    URINE, CLEAN CATCH Performed at [facility], [place] [address] [place], [place] [address]    Special Requests   Final    NONE Performed at [facility], [place] [address], [place], [place] [address]    Culture (A)  Final    <10,000 COLONIES/mL INSIGNIFICANT GROWTH Performed at [place] [address] [place], [place] [address]    Report Status 03/25/2021 FINAL  Final  Culture, blood (Routine X 2) w Reflex to ID Panel     Status: None (Preliminary result)   Collection Time: 03/24/21  2:25 AM   Specimen: BLOOD  Result Value Ref Range Status   Specimen Description   Final    BLOOD BLOOD LEFT FOREARM Performed at [place] [address] [place], [place] [address]    Special Requests   Final    BOTTLES DRAWN AEROBIC AND ANAEROBIC Blood Culture adequate volume Performed at [place] [address], [place], [place] [address]    Culture   Final    NO GROWTH 2 DAYS Performed at [place] [address], [place], [place] [address]    Report Status PENDING  Incomplete  Culture, blood (Routine X 2) w Reflex to ID Panel     Status: None (Preliminary result)   Collection Time: 03/24/21  2:30 AM   Specimen: BLOOD  Result Value Ref Range Status   Specimen Description   Final    BLOOD BLOOD RIGHT HAND Performed at [place] [address] [place], [place]  [address]    Special Requests   Final    BOTTLES DRAWN AEROBIC AND ANAEROBIC Blood Culture adequate volume Performed at [place] [address]  [place], [place] [address]    Culture   Final    NO GROWTH 2 DAYS Performed at [place] [address], [place], [place] [address]    Report Status PENDING  Incomplete  Culture, blood (routine x 2)     Status: None (Preliminary result)   Collection Time: 03/24/21 11:30 AM   Specimen: BLOOD  Result Value Ref Range Status   Specimen Description   Final    BLOOD LEFT ANTECUBITAL Performed at [facility], [place] [address], [place], [place] [address]    Special Requests   Final    BOTTLES DRAWN AEROBIC AND ANAEROBIC Blood Culture adequate volume Performed at [place] [address] [place] [address], [place], [place] [address]    Culture   Final    NO GROWTH 2 DAYS Performed at [place] [address] [place], [place] [address]    Report Status PENDING  Incomplete  MRSA PCR Screening     Status: None   Collection Time: 03/25/21  1:27 PM   Specimen: Nasal Mucosa; Nasopharyngeal  Result Value Ref Range Status   MRSA by PCR NEGATIVE NEGATIVE Final    Comment:        The GeneXpert MRSA Assay (FDA approved for NASAL specimens only), is one component of a comprehensive MRSA colonization surveillance program. It is not intended to diagnose MRSA infection nor to guide or monitor treatment for MRSA infections. Performed at [facility], [place] [address], [place], [place] [address]   Respiratory (~20 pathogens) panel by PCR     Status: None   Collection Time: 03/26/21  7:50 AM  Result Value Ref Range Status   Adenovirus NOT DETECTED NOT DETECTED Final   Coronavirus 229E NOT DETECTED NOT DETECTED Final    Comment: (NOTE) The Coronavirus on the Respiratory Panel, DOES NOT test for the novel  Coronavirus (2019 nCoV)    Coronavirus HKU1 NOT DETECTED NOT DETECTED Final   Coronavirus NL63 NOT DETECTED NOT DETECTED Final   Coronavirus OC43 NOT DETECTED NOT DETECTED Final   Metapneumovirus NOT DETECTED NOT DETECTED Final   Rhinovirus /  Enterovirus NOT DETECTED NOT DETECTED Final   Influenza A NOT DETECTED NOT DETECTED Final   Influenza B NOT DETECTED NOT DETECTED Final   Parainfluenza Virus 1 NOT DETECTED NOT DETECTED Final   Parainfluenza Virus 2 NOT DETECTED NOT DETECTED Final   Parainfluenza Virus 3 NOT DETECTED NOT DETECTED Final   Parainfluenza Virus 4 NOT DETECTED NOT DETECTED Final   Respiratory Syncytial Virus NOT DETECTED NOT DETECTED Final   Bordetella pertussis NOT DETECTED NOT DETECTED Final   Bordetella Parapertussis NOT DETECTED NOT DETECTED Final   Chlamydophila pneumoniae NOT DETECTED NOT DETECTED Final   Mycoplasma pneumoniae NOT DETECTED NOT DETECTED Final    Comment: Performed at [facility], [place]. [address], [place],  [address]    Studies/Results: DG Chest 2 View  Result Date: 03/25/2021 CLINICAL DATA:  Fever and cough. EXAM: CHEST - 2 VIEW COMPARISON:  Chest x-ray 03/23/2021 and chest CT 03/21/2021 FINDINGS: Borderline heart size is stable. The mediastinal and hilar contours are stable. Persistent patchy nodular interstitial process in the lungs without discrete focal pneumonia. No pleural effusion or pneumothorax. IMPRESSION: Persistent patchy nodular interstitial process in the lungs. Findings  could be due to atypical/viral pneumonia, reactive airways disease, hypersensitivity pneumonitis or cryptogenic organizing pneumonia. Electronically Signed   By: Marijo Sanes M.D.   On: 03/25/2021 14:14      Assessment/Plan:  INTERVAL HISTORY: still having signficant fevers   Principal Problem:   Acute respiratory failure with hypoxia (HCC) Active Problems:   Essential hypertension   Chronic diastolic CHF (congestive heart failure) (HCC)   Type 2 diabetes mellitus with hyperglycemia, with long-term current use of insulin (HCC)   Acute bronchitis   SOB (shortness of breath)   Fever   Hypokalemia   Generalized weakness    Lynn [name] is a 65 y.o. female with RA on MTX  (followed by Leafy Kindle PA at [facility]) IDDM CAD who has had 3 week hx of fevers, drenching night sweats and dry nonproductive cough. Prior to onset of her symptoms she had travelled to [place] for a funeral and had her 2nd COVID booster. She had zoster vaccine upon return and then restarted her MTX. She noted symptoms started shortly  thereafter though not clear there would be a correlation between these events. She had remote exposure to TB with negative TB skin test. She has no pets. She has travelled [place] the [place] CTA here is fairly unremarkable other than ground glass opacities at bases. CT abdomen negative. Blood cultures are without growth. She is fevering through azithromycin.  She tells me today that she was told that she had an abnormal lab with Rheumatology and was supposed to come back Monday for repeat blood work.  Yesterday TTE showed a possible vegetation on the mitral valve and we have started.  Ceftriaxone and vancomycin.  She remains on azithromycin.  Blood cultures have been unrevealing  She is still having fevers  Serological work-up for FUO has been negative so far including HIV test hepatitis panel cryptococcal antigen RPR respiratory virus panel  If she indeed has endocarditis and blood cultures failed to grow not organism she will need treatment for culture-negative endocarditis  We will make sure that she has a Legionella antigen and urine that is being sent  Given her headaches and the possibility of left-sided endocarditis I will get an MRI of the brain with contrast  I spent more than 35 minutes with the patient including greater than 50% of time in face to face counseling of the patient personally reviewing radiographs, long with pertinent laboratory microbiological data review of medical records and in coordination of her care with Dr. Zigmund Daniel and Dr. Erin Fulling.  Dr. Baxter Flattery will be back tomorrow.    LOS: 3  days   Alcide Evener 03/26/2021, 2:17 PM

## 2021-03-26 NOTE — Progress Notes (Addendum)
NAME:  Lynn [name], MRN:  [id number], DOB:  05-03-1956, LOS: 3 ADMISSION DATE:  03/23/2021, CONSULTATION DATE:  03/25/21 REFERRING MD:  Gwendlyn Deutscher CHIEF COMPLAINT:  respiratory failure, fevers   History of Present Illness:  Lynn [name] is a 65 year old woman with rheumatoid arthritis on methotrexate, HFpEF, hypertension and chronic anemia due to thalassemia, DMII who was admitted on 03/23/21 with acute hypoxemic respiratory failure.   She has been treated with doxycycline and azithromycin for concern of bronchitis. She has spiked fevers since admission with a Tmax of 101.67F. She has a dry cough.   CTA chest on 6/7 did not show pulmonary emboli. She has scattered diffuse areas of ground glass attenuation. Covid and influenz PCR is negative.   PCCM has been consulted for further evaluation and recommendations of her respiratory failure and fevers.   Patient reports having fevers, sweats and chills over the past 3 weeks.  She also reports lack of appetite and about a 10 pound weight loss.  She also reports increased knee pain where she had a prior knee replacement and also increased lower back pain.  Her husband was at the bedside as well.  She raised concerns for silicosis and she does not have any risk factors for this disease as she has been a bus driver.  No exposure to stone cutting or other occupations of the sort.  Pertinent  Medical History  Rheumatoid Arthritis Chronic Diastolic Heart Failure Hypertension Microcytic Anemia - Thalassemia DMII  Significant Hospital Events: Including procedures, antibiotic start and stop dates in addition to other pertinent events   Admitted 03/23/2021 6/11 with increasing oxygen needs to 5L   Antibiotics: Doxycycline 6/9 - 6/10 Azithromycin 6/10 >> Ceftriaxone 6/11>> Vancomycin 6/11>>  Interim History / Subjective:   Patient reports feeling a little worse today.  She does noticed increasing shortness of breath today.   Her oxygen was turned up to 5 L but when I turn her to room air she desaturates to 87% when talking but otherwise remains around 90% at rest.    Objective   Blood pressure 117/62, pulse 80, temperature 98 F (36.7 C), temperature source Oral, resp. rate 14, height _0  (1.626 m), weight 106.3 kg, SpO2 96 %.        Intake/Output Summary (Last 24 hours) at 03/26/2021 1432 Last data filed at 03/26/2021 0800 Gross per 24 hour  Intake 720 ml  Output 1000 ml  Net -280 ml    Filed Weights   03/23/21 1234 03/24/21 2133 03/25/21 0500  Weight: 105.7 kg 106.9 kg 106.3 kg    Examination: General: Elderly woman, no acute distress resting in bed HENT: [place]/AT, moist mucous membranes Lungs: Mild diffuse crackles throughout the posterior, no wheezing or rhonchi Cardiovascular: Regular rate and rhythm, no murmurs Abdomen: Soft, nontender, nondistended, bowel sounds present Extremities: No edema, warm Neuro: Alert and oriented x3, moving all extremities GU: Deferred  Labs/imaging that I have personally reviewed  (right click and "Reselect all SmartList Selections" daily)  CRP 2.8, ESR , LDH 197, CK 213, Ferritin 130, BNP 57  UA - negative for protein, nitrite, small hgb - 11-20 BRCs  Procal <0.1 Blood culture 6/10 >> Urine Culture - too many colonies Covid/influenza - negative  CT Abdomen 03/23/21 1. No acute abnormality identified in the abdomen and pelvis. 2. Fatty infiltration of liver. 3. Diverticulosis of colon without diverticulitis. 4. Aortic atherosclerosis.  CTA Chest 03/23/21 Emboli noted.  No mediastinal or hilar adenopathy.  Scattered groundglass  attenuation scattered bilaterally concerning for air trapping.  Echo 03/23/21 EF 60 to 65%.  Moderate left ventricular hypertrophy.  RV systolic function is normal, RV size is normal.  Mild elevated pulmonary artery systolic pressure estimated at 37.3 mmHg.  Mitral valve is abnormal with ill-defined density measuring 3.23 x 2.10  cm.  CXR 6/11 -increased interstitial prominence bilaterally  CXR 6/12 -persistent interstitial prominence bilaterally, no new opacities  Resolved Hospital Problem list     Assessment & Plan:  Acute hypoxemic respiratory failure - wean O2 for Spo2 greater than 92% - She has scattered mosiac attenuation on CT chest along with increasing intersitial prominence on follow up chest radiographs -The differential includes atypical pneumonia vs interstitial lung process vs pulmonary edema  - check extended respiratory viral panel, Legionella antigen, BNP -Plan is to continue current antibiotic coverage with ceftriaxone, vancomycin, azithromycin -We will plan for bronchoscopy to follow the TEE procedure by cardiology is to utilize one anesthesia block of time if possible.  Recommend sending BAL samples for cell count, respiratory culture, fungal culture, PJP PCR -Inflammatory work-up is pending  Concern for Endocarditis Given recent echo findings of mitral valve density, this would explain her persistent fevers and night sweats along with weight loss over recent weeks - ID following.  - Currently on ceftriaxone, vancomycin and azithromycin - blood cultures pending  Best practice (right click and "Reselect all SmartList Selections" daily)  Per primary team  Labs   CBC: Recent Labs  Lab 03/20/21 1220 03/23/21 1301 03/26/21 0402  WBC 7.6 8.6 14.6*  NEUTROABS  --  6.0  --   HGB 9.5* 9.3* 8.1*  HCT 30.6* 32.1* 27.3*  MCV 60.5 Repeated and verified X2.* 63.6* 63.0*  PLT 353.0 368 349     Basic Metabolic Panel: Recent Labs  Lab 03/20/21 1220 03/23/21 1301 03/26/21 0402  NA 137 137 132*  K 4.0 3.3* 4.0  CL 99 102 100  CO2 _0 GLUCOSE 135* 115* 227*  BUN _1 CREATININE 1.19 1.05* 1.13*  CALCIUM 9.5 9.0 8.7*    GFR: Estimated Creatinine Clearance: 57.4 mL/min (A) (by C-G formula based on SCr of 1.13 mg/dL (H)). Recent Labs  Lab 03/20/21 1220 03/23/21 1301  03/24/21 0225 03/26/21 0402  PROCALCITON  --   --  <0.10  --   WBC 7.6 8.6  --  14.6*     Liver Function Tests: Recent Labs  Lab 03/20/21 1220 03/23/21 1301 03/26/21 0402  AST _2 ALT _3 ALKPHOS 80 69 62  BILITOT 0.5 0.4 0.7  PROT 7.2 7.4 7.1  ALBUMIN 4.1 3.5 3.2*    No results for input(s): LIPASE, AMYLASE in the last 168 hours. No results for input(s): AMMONIA in the last 168 hours.  ABG    Component Value Date/Time   HCO3 25.6 03/24/2021 0230   TCO2 28 07/03/2017 0814   O2SAT 99.0 03/24/2021 0230      Coagulation Profile: No results for input(s): INR, PROTIME in the last 168 hours.  Cardiac Enzymes: Recent Labs  Lab 03/25/21 1126  CKTOTAL 213     HbA1C: Hemoglobin A1C  Date/Time Value Ref Range Status  01/30/2021 03:24 PM 8.0 (A) 4.0 - 5.6 % Final  09/23/2020 02:54 PM 8.5 (A) 4.0 - 5.6 % Final   Hgb A1c MFr Bld  Date/Time Value Ref Range Status  03/21/2020 02:52 PM 9.8 (H) 4.6 - 6.5 % Final    Comment:  Glycemic Control Guidelines for People with Diabetes:Non Diabetic:  <6%Goal of Therapy: <7%Additional Action Suggested:  >8%   07/15/2019 02:29 PM 10.5 (H) 4.6 - 6.5 % Final    Comment:    Glycemic Control Guidelines for People with Diabetes:Non Diabetic:  <6%Goal of Therapy: <7%Additional Action Suggested:  >8%     CBG: Recent Labs  Lab 03/25/21 1150 03/25/21 1633 03/25/21 2029 03/26/21 0748 03/26/21 1055  GLUCAP 380* 255* 203* 222* 336*      Critical care time: n/a    Freda Jackson, MD [facility] Office: [phone]   See Amion for personal pager PCCM on call pager [phone] until 7pm. Please call Elink 7p-7a. [phone]

## 2021-03-26 NOTE — Plan of Care (Signed)
  Problem: Clinical Measurements: Goal: Respiratory complications will improve; pt will require less than 8 LNC Outcome: Not Progressing Intervention: Wean Oxygen as tolerated, Refer to RT for prn breathing treatments    Problem: Education: Goal: Knowledge of General Education information will improve Description: Including pain rating scale, medication(s)/side effects and non-pharmacologic comfort measures Outcome: Progressing   Problem: Health Behavior/Discharge Planning: Goal: Ability to manage health-related needs will improve Outcome: Progressing   Problem: Clinical Measurements: Goal: Ability to maintain clinical measurements within normal limits will improve Outcome: Progressing Goal: Will remain free from infection Outcome: Progressing Goal: Diagnostic test results will improve Outcome: Progressing Goal: Cardiovascular complication will be avoided Outcome: Progressing   Problem: Activity: Goal: Risk for activity intolerance will decrease Outcome: Progressing   Problem: Nutrition: Goal: Adequate nutrition will be maintained Outcome: Progressing   Problem: Coping: Goal: Level of anxiety will decrease Outcome: Progressing    Problem: Elimination: Goal: Will not experience complications related to bowel motility Outcome: Progressing Goal: Will not experience complications related to urinary retention Outcome: Progressing   Problem: Pain Managment: Goal: General experience of comfort will improve Outcome: Progressing   Problem: Safety: Goal: Ability to remain free from injury will improve Outcome: Progressing   Problem: Skin Integrity: Goal: Risk for impaired skin integrity will decrease Outcome: Progressing

## 2021-03-26 NOTE — Progress Notes (Addendum)
PROGRESS NOTE    Lynn [name]  VAN:[id number] DOB: 16-Apr-1956 DOA: 03/23/2021 PCP: [name], MD   Brief Narrative: 65 year old female with RA on methotrexate, diastolic heart failure, hypertension, hyperlipidemia, thalassemia type 2 diabetes admitted with fever of unknown origin. Patient reports having fever of 10 1-1 02 at home associated with some shortness of breath and nonproductive cough, she was started on doxycycline for possible bronchitis.  She continued to have fever in spite of antibiotics. Patient reports traveling to [place] for a funeral a month ago.  She has not had COVID but she has taken COVID-vaccine. CT of the chest abdomen and pelvis shows no evidence of acute or active infection. She is not on oxygen at home Procalcitonin is low, sed rate is 46 CRP is 2.8 She is a former smoker  Assessment & Plan:   Principal Problem:   Acute respiratory failure with hypoxia (HCC) Active Problems:   Essential hypertension   Chronic diastolic CHF (congestive heart failure) (HCC)   Type 2 diabetes mellitus with hyperglycemia, with long-term current use of insulin (HCC)   Acute bronchitis   SOB (shortness of breath)   Fever   Hypokalemia   Generalized weakness   # Acute hypoxic respiratory failure-of unclear etiology.  Patient has no history of asthma or COPD.  She is a former smoker.   CT chest no evidence of pulmonary embolism infiltrates atelectasis or pleural effusion noted. She has a normal procalcitonin  Crp trending up 9.5 from 2.8 Esr trending up 68 from 47  LDH 197 CPK 213 BNP 197 Ferritin 130 D-dimer 0.55 WBC 14.6 from 8.6 prior to starting Solu-Medrol Hemoglobin 8.1 from 9.3 TSH 4.01  Chest x-ray 03/25/2021-persistent patchy nodular interstitial process in the lungs could be due to atypical/viral pneumonia or reactive airway disease/hypersensitivity pneumonitis/cryptogenic organizing pneumonia COVID-negative flu negative respiratory virus  panel negative, hepatitis panel normal, cryptococcal antigen negative HIV nonreactive.. Azithromycin started 03/24/2021 Rocephin started 03/25/2021 for possible CAP .   Vancomycin started 03/25/2021 for possible mitral valve endocarditis.  Renal functions need to be monitored closely.  Creatinine clearance 57. Discussed with PCCM and ID   Nebulizer treatments  ADDING solumedrol 6/12 as patient diffusely wheezing and loud wheezing. Echocardiogram-action fraction 60 to 65%.  Abnormal density in the mitral valve.  #History CAD nonobstructive/chronic diastolic heart failure -stable on hctz at home which has not been restarted .  Echo with ejection fraction 60 to 65%.  #History of essential hypertension on losartan and diltiazem.  Restarted   #type 2 dm -on insulin pump at home with Victoza.  Will increase SSI to resistant, Lantus 6 units nightly and 3 units of NovoLog 3 times a day prior to meals. CBG (last 3)  Recent Labs    03/25/21 2029 03/26/21 0748 03/26/21 1055  GLUCAP 203* 222* 336*   Will increase the dose of Lantus and NovoLog since we are going to start her on Solu-Medrol.   #Chronic microcytic anemia/thalassemia Baseline hemoglobin 9-10 monitor daily.  Hemoglobin dropping to 8 with no signs of active bleeding.  Will check FOBT.  # fever of unknown origin CT chest abdomen pelvis does not reveal any acute source for the fever.    # rule out mitral valve endocarditis blood cultures negative.  Trans thoracic echo with density in the mitral valve.  Informed Dr. Radford Pax patient needs a transesophageal echo.  She has chronic mitral valve stenosis.  #1 paroxysmal atrial tachycardia on Cardizem at home.  Patient went into a brief episode  of A. fib RVR and rate of 110-1 20s given Cardizem.  She converted to sinus rhythm in less than 2 hours.   Estimated body mass index is 40.23 kg/m as calculated from the following:   Height as of this encounter: _0  (1.626 m).   Weight as of this  encounter: 106.3 kg.  DVT prophylaxis: lovenox Code Status: full Family Communication: none at bed side  Disposition Plan:  Status is: Inpatient  Remains inpatient appropriate because:Persistent severe electrolyte disturbances  Dispo: The patient is from: Home              Anticipated d/c is to: Home              Patient currently is not medically stable to d/c.   Difficult to place patient No   Consultants: id  Procedures: none Antimicrobials: none  Subjective: Still spiking fever on antibiotics Tmax 101.2  Feels worse today having continuous cough and having audible wheezes  Objective: Vitals:   03/25/21 2130 03/26/21 0523 03/26/21 0745 03/26/21 0905  BP:  (!) 135/59  140/83  Pulse:  97  93  Resp:    20  Temp:  (!) 95 F (35 C)  100.1 F (37.8 C)  TempSrc:  Oral  Oral  SpO2: 93% 95% 98% 94%  Weight:      Height:        Intake/Output Summary (Last 24 hours) at 03/26/2021 1213 Last data filed at 03/26/2021 0800 Gross per 24 hour  Intake 720 ml  Output 1000 ml  Net -280 ml    Filed Weights   03/23/21 1234 03/24/21 2133 03/25/21 0500  Weight: 105.7 kg 106.9 kg 106.3 kg    Examination:  General exam: in nad  Respiratory system: rhonchi b/l to auscultation. Respiratory effort normal. Cardiovascular system: S1 & S2 heard, RRR. No JVD, murmurs, rubs, gallops or clicks. No pedal edema. Gastrointestinal system: Abdomen is nondistended, soft and nontender. No organomegaly or masses felt. Normal bowel sounds heard. Central nervous system: Alert and oriented. No focal neurological deficits. Extremities: Symmetric 5 x 5 power. Skin: No rashes, lesions or ulcers Psychiatry: Judgement and insight appear normal. Mood & affect appropriate.     Data Reviewed: I have personally reviewed following labs and imaging studies  CBC: Recent Labs  Lab 03/20/21 1220 03/23/21 1301 03/26/21 0402  WBC 7.6 8.6 14.6*  NEUTROABS  --  6.0  --   HGB 9.5* 9.3* 8.1*  HCT 30.6*  32.1* 27.3*  MCV 60.5 Repeated and verified X2.* 63.6* 63.0*  PLT 353.0 368 563    Basic Metabolic Panel: Recent Labs  Lab 03/20/21 1220 03/23/21 1301 03/26/21 0402  NA 137 137 132*  K 4.0 3.3* 4.0  CL 99 102 100  CO2 _1 GLUCOSE 135* 115* 227*  BUN _2 CREATININE 1.19 1.05* 1.13*  CALCIUM 9.5 9.0 8.7*    GFR: Estimated Creatinine Clearance: 57.4 mL/min (A) (by C-G formula based on SCr of 1.13 mg/dL (H)). Liver Function Tests: Recent Labs  Lab 03/20/21 1220 03/23/21 1301 03/26/21 0402  AST _3 ALT _4 ALKPHOS 80 69 62  BILITOT 0.5 0.4 0.7  PROT 7.2 7.4 7.1  ALBUMIN 4.1 3.5 3.2*    No results for input(s): LIPASE, AMYLASE in the last 168 hours. No results for input(s): AMMONIA in the last 168 hours. Coagulation Profile: No results for input(s): INR, PROTIME in the last 168 hours. Cardiac Enzymes: Recent  Labs  Lab 03/25/21 1126  CKTOTAL 213    BNP (last 3 results) Recent Labs    03/20/21 1220  PROBNP 57.0    HbA1C: No results for input(s): HGBA1C in the last 72 hours. CBG: Recent Labs  Lab 03/25/21 1150 03/25/21 1633 03/25/21 2029 03/26/21 0748 03/26/21 1055  GLUCAP 380* 255* 203* 222* 336*    Lipid Profile: No results for input(s): CHOL, HDL, LDLCALC, TRIG, CHOLHDL, LDLDIRECT in the last 72 hours. Thyroid Function Tests: No results for input(s): TSH, T4TOTAL, FREET4, T3FREE, THYROIDAB in the last 72 hours. Anemia Panel: Recent Labs    03/25/21 1126  FERRITIN 130    Sepsis Labs: Recent Labs  Lab 03/24/21 0225  PROCALCITON <0.10     Recent Results (from the past 240 hour(s))  Resp Panel by RT-PCR (Flu A&B, Covid) Nasopharyngeal Swab     Status: None   Collection Time: 03/23/21  5:32 PM   Specimen: Nasopharyngeal Swab; Nasopharyngeal(NP) swabs in vial transport medium  Result Value Ref Range Status   SARS Coronavirus 2 by RT PCR NEGATIVE NEGATIVE Final    Comment: (NOTE) SARS-CoV-2 target nucleic acids  are NOT DETECTED.  The SARS-CoV-2 RNA is generally detectable in upper respiratory specimens during the acute phase of infection. The lowest concentration of SARS-CoV-2 viral copies this assay can detect is 138 copies/mL. A negative result does not preclude SARS-Cov-2 infection and should not be used as the sole basis for treatment or other patient management decisions. A negative result may occur with  improper specimen collection/handling, submission of specimen other than nasopharyngeal swab, presence of viral mutation(s) within the areas targeted by this assay, and inadequate number of viral copies(<138 copies/mL). A negative result must be combined with clinical observations, patient history, and epidemiological information. The expected result is Negative.  Fact Sheet for Patients:  EntrepreneurPulse.com.au  Fact Sheet for Healthcare Providers:  IncredibleEmployment.be  This test is no t yet approved or cleared by the [place] FDA and  has been authorized for detection and/or diagnosis of SARS-CoV-2 by FDA under an Emergency Use Authorization (EUA). This EUA will remain  in effect (meaning this test can be used) for the duration of the COVID-19 declaration under Section 564(b)(1) of the Act, 21 U.S.C.section 360bbb-3(b)(1), unless the authorization is terminated  or revoked sooner.       Influenza A by PCR NEGATIVE NEGATIVE Final   Influenza B by PCR NEGATIVE NEGATIVE Final    Comment: (NOTE) The Xpert Xpress SARS-CoV-2/FLU/RSV plus assay is intended as an aid in the diagnosis of influenza from Nasopharyngeal swab specimens and should not be used as a sole basis for treatment. Nasal washings and aspirates are unacceptable for Xpert Xpress SARS-CoV-2/FLU/RSV testing.  Fact Sheet for Patients: EntrepreneurPulse.com.au  Fact Sheet for Healthcare Providers: IncredibleEmployment.be  This test is  not yet approved or cleared by the [place] FDA and has been authorized for detection and/or diagnosis of SARS-CoV-2 by FDA under an Emergency Use Authorization (EUA). This EUA will remain in effect (meaning this test can be used) for the duration of the COVID-19 declaration under Section 564(b)(1) of the Act, 21 U.S.C. section 360bbb-3(b)(1), unless the authorization is terminated or revoked.  Performed at [facility], [place] [address], [place], [place] [address]   Urine culture     Status: Abnormal   Collection Time: 03/23/21  5:32 PM   Specimen: Urine, Clean Catch  Result Value Ref Range Status   Specimen Description   Final  URINE, CLEAN CATCH Performed at [facility], [place] [address] [place], [place] [address]    Special Requests   Final    NONE Performed at [facility], [place] [address] [place], [place] [address]    Culture (A)  Final    <10,000 COLONIES/mL INSIGNIFICANT GROWTH Performed at [facility] [address], [place], [place] [address]    Report Status 03/25/2021 FINAL  Final  Culture, blood (Routine X 2) w Reflex to ID Panel     Status: None (Preliminary result)   Collection Time: 03/24/21  2:25 AM   Specimen: BLOOD  Result Value Ref Range Status   Specimen Description   Final    BLOOD BLOOD LEFT FOREARM Performed at [facility] [address], [place], [place] [address]    Special Requests   Final    BOTTLES DRAWN AEROBIC AND ANAEROBIC Blood Culture adequate volume Performed at [facility] [address], [place], [place] [address]    Culture   Final    NO GROWTH 2 DAYS Performed at [facility] [address], [place], [place] [address]    Report Status PENDING  Incomplete  Culture, blood (Routine X 2) w Reflex to ID Panel     Status: None (Preliminary result)   Collection Time: 03/24/21  2:30 AM   Specimen: BLOOD  Result Value Ref  Range Status   Specimen Description   Final    BLOOD BLOOD RIGHT HAND Performed at [facility] [address], [place], [place] [address]    Special Requests   Final    BOTTLES DRAWN AEROBIC AND ANAEROBIC Blood Culture adequate volume Performed at [facility] [address] [place], [place] [address]    Culture   Final    NO GROWTH 2 DAYS Performed at [facility] [address], [place], [place] [address]    Report Status PENDING  Incomplete  Culture, blood (routine x 2)     Status: None (Preliminary result)   Collection Time: 03/24/21 11:30 AM   Specimen: BLOOD  Result Value Ref Range Status   Specimen Description   Final    BLOOD LEFT ANTECUBITAL Performed at [facility], [place] [address], [place], [place] [address]    Special Requests   Final    BOTTLES DRAWN AEROBIC AND ANAEROBIC Blood Culture adequate volume Performed at [facility] [address], [place], [place] [address]    Culture   Final    NO GROWTH 2 DAYS Performed at [facility] [address], [place], [place] [address]    Report Status PENDING  Incomplete  MRSA PCR Screening     Status: None   Collection Time: 03/25/21  1:27 PM   Specimen: Nasal Mucosa; Nasopharyngeal  Result Value Ref Range Status   MRSA by PCR NEGATIVE NEGATIVE Final    Comment:        The GeneXpert MRSA Assay (FDA approved for NASAL specimens only), is one component of a comprehensive MRSA colonization surveillance program. It is not intended to diagnose MRSA infection nor to guide or monitor treatment for MRSA infections. Performed at [facility], [place] [address] [place],  [address]           Radiology Studies: DG Chest 2 View  Result Date: 03/25/2021 CLINICAL DATA:  Fever and cough. EXAM: CHEST - 2 VIEW COMPARISON:  Chest  x-ray 03/23/2021 and chest CT 03/21/2021 FINDINGS: Borderline heart size is stable. The  mediastinal and hilar contours are stable. Persistent patchy nodular interstitial process in the lungs without discrete focal pneumonia. No pleural effusion or pneumothorax. IMPRESSION: Persistent patchy nodular interstitial process in the lungs. Findings could be due to atypical/viral pneumonia, reactive airways disease, hypersensitivity pneumonitis or cryptogenic organizing pneumonia. Electronically Signed   By: Marijo Sanes M.D.   On: 03/25/2021 14:14   ECHOCARDIOGRAM COMPLETE  Result Date: 03/24/2021    ECHOCARDIOGRAM REPORT   Patient Name:   JARI CAROLLO Date of Exam: 03/24/2021 Medical Rec #:  [id number]               Height:       64.0 in Accession #:    [id number]              Weight:       233.0 lb Date of Birth:  1956/08/25                BSA:          2.088 m Patient Age:    72 years                BP:           155/70 mmHg Patient Gender: F                       HR:           80 bpm. Exam Location:  Inpatient Procedure: 2D Echo, Cardiac Doppler and Color Doppler Indications:    Fever R50.9  History:        Patient has prior history of Echocardiogram examinations, most                 recent 02/09/2020. CAD and Angina, Signs/Symptoms:Murmur and                 Syncope; Risk Factors:Hypertension, Diabetes, Dyslipidemia and                 Sleep Apnea.  Sonographer:    Jonelle Sidle Dance Referring Phys: [id number] Rhetta Mura  Sonographer Comments: Reading Cardiologist notified. IMPRESSIONS  1. Left ventricular ejection fraction, by estimation, is 60 to 65%. The left ventricle has normal function. The left ventricle has no regional wall motion abnormalities. There is moderate left ventricular hypertrophy of the basal-septal segment. Left ventricular diastolic parameters are consistent with Grade II diastolic dysfunction (pseudonormalization). Elevated left ventricular end-diastolic pressure.  2. Right ventricular systolic function is normal. The right ventricular size is normal. There is mildly  elevated pulmonary artery systolic pressure. The estimated right ventricular systolic pressure is 02.4 mmHg.  3. Left atrial size was mildly dilated.  4. The mitral valve is abnormal. There is an ill defined density measuring 3.23 x 2.10cm that appears to originate off the posterior MV leaflet but cannot be definite on this. There is also mitral annular calcification present. There is moderate mitral stenosis with a mean MVG of 61mHg. No evidence of mitral valve regurgitation.  5. The aortic valve is normal in structure. Aortic valve regurgitation is not visualized. No aortic stenosis is present.  6. The inferior vena cava is normal in size with greater than 50% respiratory variability, suggesting right atrial pressure of 3 mmHg.  7. Recommend TEE for possible mitral valve endocarditis in setting of abnormal mitral valve and fevers. FINDINGS  Left Ventricle: Left ventricular ejection fraction, by  estimation, is 60 to 65%. The left ventricle has normal function. The left ventricle has no regional wall motion abnormalities. The left ventricular internal cavity size was normal in size. There is  moderate left ventricular hypertrophy of the basal-septal segment. Left ventricular diastolic parameters are consistent with Grade II diastolic dysfunction (pseudonormalization). Elevated left ventricular end-diastolic pressure. Right Ventricle: The right ventricular size is normal. No increase in right ventricular wall thickness. Right ventricular systolic function is normal. There is mildly elevated pulmonary artery systolic pressure. The tricuspid regurgitant velocity is 2.93  m/s, and with an assumed right atrial pressure of 3 mmHg, the estimated right ventricular systolic pressure is 91.6 mmHg. Left Atrium: Left atrial size was mildly dilated. Right Atrium: Right atrial size was normal in size. Pericardium: There is no evidence of pericardial effusion. Mitral Valve: There is an ill defined density measuring 3.23 x 2.10cm  that appears to originate off the posterior MV leaflet but cannot be definite on this. There is also mitral annular calcification present. There is moderate mitral stenosis with a mean  MVG of 70mHg. The mitral valve is abnormal. Severe mitral annular calcification. No evidence of mitral valve regurgitation. Moderate mitral valve stenosis. MV peak gradient, 18.8 mmHg. The mean mitral valve gradient is 9.0 mmHg. Tricuspid Valve: The tricuspid valve is normal in structure. Tricuspid valve regurgitation is mild . No evidence of tricuspid stenosis. Aortic Valve: The aortic valve is normal in structure. Aortic valve regurgitation is not visualized. No aortic stenosis is present. Pulmonic Valve: The pulmonic valve was normal in structure. Pulmonic valve regurgitation is not visualized. No evidence of pulmonic stenosis. Aorta: The aortic root is normal in size and structure. Venous: The inferior vena cava is normal in size with greater than 50% respiratory variability, suggesting right atrial pressure of 3 mmHg. IAS/Shunts: No atrial level shunt detected by color flow Doppler.  LEFT VENTRICLE PLAX 2D LVIDd:         3.00 cm  Diastology LVIDs:         1.70 cm  LV e' medial:    6.42 cm/s LV PW:         2.10 cm  LV E/e' medial:  25.9 LV IVS:        1.40 cm  LV e' lateral:   7.51 cm/s LVOT diam:     1.90 cm  LV E/e' lateral: 22.1 LV SV:         59 LV SV Index:   28 LVOT Area:     2.84 cm  RIGHT VENTRICLE             IVC RV Basal diam:  2.80 cm     IVC diam: 1.70 cm RV S prime:     19.60 cm/s TAPSE (M-mode): 2.1 cm LEFT ATRIUM             Index       RIGHT ATRIUM          Index LA diam:        4.20 cm 2.01 cm/m  RA Area:     9.78 cm LA Vol (A2C):   82.4 ml 39.47 ml/m RA Volume:   20.10 ml 9.63 ml/m LA Vol (A4C):   71.0 ml 34.01 ml/m LA Biplane Vol: 76.5 ml 36.65 ml/m  AORTIC VALVE LVOT Vmax:   116.00 cm/s LVOT Vmean:  75.000 cm/s LVOT VTI:    0.207 m  AORTA Ao Root diam: 3.20 cm Ao Asc diam:  3.10 cm MITRAL VALVE  TRICUSPID VALVE MV Area (PHT): 2.76 cm     TR Peak grad:   34.3 mmHg MV Area VTI:   0.89 cm     TR Vmax:        293.00 cm/s MV Peak grad:  18.8 mmHg MV Mean grad:  9.0 mmHg     SHUNTS MV Vmax:       2.17 m/s     Systemic VTI:  0.21 m MV Vmean:      147.0 cm/s   Systemic Diam: 1.90 cm MV Decel Time: 275 msec MV E velocity: 166.00 cm/s MV A velocity: 195.00 cm/s MV E/A ratio:  0.85 Fransico Him MD Electronically signed by Fransico Him MD Signature Date/Time: 03/24/2021/12:59:52 PM    Final         Scheduled Meds:  atorvastatin  80 mg Oral q1800   azithromycin  500 mg Oral Daily   diltiazem  60 mg Oral Q8H   fluticasone  1 spray Each Nare Daily   insulin aspart  0-20 Units Subcutaneous TID WC   insulin aspart  0-5 Units Subcutaneous QHS   insulin aspart  3 Units Subcutaneous TID WC   insulin glargine  6 Units Subcutaneous QHS   ipratropium-albuterol  3 mL Nebulization BID   loratadine  10 mg Oral Daily   losartan  100 mg Oral Daily   methylPREDNISolone (SOLU-MEDROL) injection  40 mg Intravenous Q6H   multivitamin with minerals  1 tablet Oral Daily   Continuous Infusions:  cefTRIAXone (ROCEPHIN)  IV 2 g (03/26/21 0756)   vancomycin       LOS: 3 days    Time spent: 38 min  Georgette Shell, MD 03/26/2021, 12:13 PM

## 2021-03-27 ENCOUNTER — Encounter (HOSPITAL_COMMUNITY): Payer: Self-pay | Admitting: Internal Medicine

## 2021-03-27 LAB — COMPREHENSIVE METABOLIC PANEL
ALT: 18 U/L (ref 0–44)
AST: 18 U/L (ref 15–41)
Albumin: 3.1 g/dL — ABNORMAL LOW (ref 3.5–5.0)
Alkaline Phosphatase: 68 U/L (ref 38–126)
Anion gap: 7 (ref 5–15)
BUN: 19 mg/dL (ref 8–23)
CO2: 27 mmol/L (ref 22–32)
Calcium: 9.1 mg/dL (ref 8.9–10.3)
Chloride: 99 mmol/L (ref 98–111)
Creatinine, Ser: 1.23 mg/dL — ABNORMAL HIGH (ref 0.44–1.00)
GFR, Estimated: 48 mL/min — ABNORMAL LOW (ref >60)
Glucose, Bld: 393 mg/dL — ABNORMAL HIGH (ref 70–99)
Potassium: 4.7 mmol/L (ref 3.5–5.1)
Sodium: 133 mmol/L — ABNORMAL LOW (ref 135–145)
Total Bilirubin: 0.6 mg/dL (ref 0.3–1.2)
Total Protein: 7.5 g/dL (ref 6.5–8.1)

## 2021-03-27 LAB — GLUCOSE, CAPILLARY
Glucose-Capillary: 394 mg/dL — ABNORMAL HIGH (ref 70–99)
Glucose-Capillary: 379 mg/dL — ABNORMAL HIGH (ref 70–99)
Glucose-Capillary: 451 mg/dL — ABNORMAL HIGH (ref 70–99)
Glucose-Capillary: 313 mg/dL — ABNORMAL HIGH (ref 70–99)
Glucose-Capillary: 337 mg/dL — ABNORMAL HIGH (ref 70–99)

## 2021-03-27 LAB — HIV-1 RNA QUANT-NO REFLEX-BLD
HIV 1 RNA Quant: <20 copies/mL
LOG10 HIV-1 RNA: UNDETERMINED log10copy/mL

## 2021-03-27 LAB — BASIC METABOLIC PANEL
Anion gap: 9 (ref 5–15)
Anion gap: 11 (ref 5–15)
BUN: 20 mg/dL (ref 8–23)
BUN: 22 mg/dL (ref 8–23)
CO2: 24 mmol/L (ref 22–32)
CO2: 25 mmol/L (ref 22–32)
Calcium: 9 mg/dL (ref 8.9–10.3)
Calcium: 9.4 mg/dL (ref 8.9–10.3)
Chloride: 99 mmol/L (ref 98–111)
Chloride: 97 mmol/L — ABNORMAL LOW (ref 98–111)
Creatinine, Ser: 1.3 mg/dL — ABNORMAL HIGH (ref 0.44–1.00)
Creatinine, Ser: 1.11 mg/dL — ABNORMAL HIGH (ref 0.44–1.00)
GFR, Estimated: 45 mL/min — ABNORMAL LOW (ref >60)
GFR, Estimated: 54 mL/min — ABNORMAL LOW (ref >60)
Glucose, Bld: 429 mg/dL — ABNORMAL HIGH (ref 70–99)
Glucose, Bld: 467 mg/dL — ABNORMAL HIGH (ref 70–99)
Potassium: 4.5 mmol/L (ref 3.5–5.1)
Potassium: 4.3 mmol/L (ref 3.5–5.1)
Sodium: 132 mmol/L — ABNORMAL LOW (ref 135–145)
Sodium: 133 mmol/L — ABNORMAL LOW (ref 135–145)

## 2021-03-27 LAB — FOLATE: Folate: 6.5 ng/mL (ref >5.9)

## 2021-03-27 LAB — CBC
HCT: 27.2 % — ABNORMAL LOW (ref 36.0–46.0)
Hemoglobin: 8 g/dL — ABNORMAL LOW (ref 12.0–15.0)
MCH: 18.7 pg — ABNORMAL LOW (ref 26.0–34.0)
MCHC: 29.4 g/dL — ABNORMAL LOW (ref 30.0–36.0)
MCV: 63.6 fL — ABNORMAL LOW (ref 80.0–100.0)
Platelets: 361 10*3/uL (ref 150–400)
RBC: 4.28 MIL/uL (ref 3.87–5.11)
RDW: 18.5 % — ABNORMAL HIGH (ref 11.5–15.5)
WBC: 18 10*3/uL — ABNORMAL HIGH (ref 4.0–10.5)
nRBC: 0 % (ref 0.0–0.2)

## 2021-03-27 LAB — FERRITIN: Ferritin: 194 ng/mL (ref 11–307)

## 2021-03-27 LAB — IRON AND TIBC
Iron: 38 ug/dL (ref 28–170)
Saturation Ratios: 16 % (ref 10.4–31.8)
TIBC: 233 ug/dL — ABNORMAL LOW (ref 250–450)
UIBC: 195 ug/dL

## 2021-03-27 LAB — D-DIMER, QUANTITATIVE: D-Dimer, Quant: 2.65 ug/mL-FEU — ABNORMAL HIGH (ref 0.00–0.50)

## 2021-03-27 LAB — RETICULOCYTES
Immature Retic Fract: 32.1 % — ABNORMAL HIGH (ref 2.3–15.9)
RBC.: 4.49 MIL/uL (ref 3.87–5.11)
Retic Count, Absolute: 104.6 10*3/uL (ref 19.0–186.0)
Retic Ct Pct: 2.3 % (ref 0.4–3.1)

## 2021-03-27 LAB — VITAMIN B12: Vitamin B-12: 291 pg/mL (ref 180–914)

## 2021-03-27 LAB — CMV IGM: CMV IgM: <30 AU/mL (ref 0.0–29.9)

## 2021-03-27 LAB — CMV ANTIBODY, IGG (EIA): CMV Ab - IgG: <0.6 U/mL (ref 0.00–0.59)

## 2021-03-27 MED ORDER — SACCHAROMYCES BOULARDII 250 MG PO CAPS
250.0000 mg | ORAL_CAPSULE | Freq: Two times a day (BID) | ORAL | Status: DC
  Administered 2021-03-27 – 2021-03-31 (×8): 250 mg via ORAL
  Filled 2021-03-27 (×8): qty 1

## 2021-03-27 MED ORDER — INSULIN GLARGINE 100 UNIT/ML ~~LOC~~ SOLN
25.0000 [IU] | Freq: Every day | SUBCUTANEOUS | Status: DC
  Administered 2021-03-27: 25 [IU] via SUBCUTANEOUS
  Filled 2021-03-27: qty 0.25

## 2021-03-27 MED ORDER — INSULIN GLARGINE 100 UNIT/ML ~~LOC~~ SOLN
48.0000 [IU] | Freq: Every day | SUBCUTANEOUS | Status: DC
  Administered 2021-03-27: 48 [IU] via SUBCUTANEOUS
  Filled 2021-03-27: qty 0.48

## 2021-03-27 MED ORDER — INSULIN ASPART 100 UNIT/ML IJ SOLN
0.0000 [IU] | Freq: Three times a day (TID) | INTRAMUSCULAR | Status: DC
  Administered 2021-03-27: 15 [IU] via SUBCUTANEOUS
  Administered 2021-03-27 (×2): 20 [IU] via SUBCUTANEOUS
  Administered 2021-03-28 (×2): 15 [IU] via SUBCUTANEOUS
  Administered 2021-03-28: 11 [IU] via SUBCUTANEOUS
  Administered 2021-03-29 (×2): 15 [IU] via SUBCUTANEOUS
  Administered 2021-03-29: 11 [IU] via SUBCUTANEOUS
  Administered 2021-03-30: 4 [IU] via SUBCUTANEOUS
  Administered 2021-03-30: 3 [IU] via SUBCUTANEOUS
  Administered 2021-03-30: 7 [IU] via SUBCUTANEOUS
  Administered 2021-03-31 (×3): 11 [IU] via SUBCUTANEOUS
  Administered 2021-04-01: 3 [IU] via SUBCUTANEOUS
  Administered 2021-04-01: 7 [IU] via SUBCUTANEOUS
  Administered 2021-04-01 – 2021-04-02 (×2): 20 [IU] via SUBCUTANEOUS
  Administered 2021-04-02: 4 [IU] via SUBCUTANEOUS
  Administered 2021-04-02 – 2021-04-03 (×2): 3 [IU] via SUBCUTANEOUS
  Administered 2021-04-03: 15 [IU] via SUBCUTANEOUS

## 2021-03-27 MED ORDER — METHYLPREDNISOLONE SODIUM SUCC 40 MG IJ SOLR
40.0000 mg | INTRAMUSCULAR | Status: DC
  Administered 2021-03-28 – 2021-04-02 (×6): 40 mg via INTRAVENOUS
  Filled 2021-03-27 (×6): qty 1

## 2021-03-27 MED ORDER — SODIUM CHLORIDE 0.9 % IV SOLN: INTRAVENOUS | Status: DC

## 2021-03-27 NOTE — Progress Notes (Signed)
Inpatient Diabetes Program Recommendations  AACE/ADA: New Consensus Statement on Inpatient Glycemic Control (2015)  Target Ranges:  Prepandial:   less than 140 mg/dL      Peak postprandial:   less than 180 mg/dL (1-2 hours)      Critically ill patients:  140 - 180 mg/dL   Lab Results  Component Value Date   GLUCAP 379 (H) 03/27/2021   HGBA1C 8.0 (A) 01/30/2021    Review of Glycemic Control Results for Lynn [name], Lynn [name] "PAT" (MRN [id number]) as of 03/27/2021 08:46  Ref. Range 03/26/2021 17:13 03/26/2021 18:36 03/26/2021 20:47 03/27/2021 02:32 03/27/2021 07:33  Glucose-Capillary Latest Ref Range: 70 - 99 mg/dL 445 (H) 448 (H) 407 (H) 394 (H) 379 (H)   Diabetes history:  DM2 Outpatient Diabetes medications:  Omnipod/Dexcon CGM/Humalog Basal-2.5/hr (60 units QD) Insulin:Carb ration-1 unit/12 carbs Target-100-100 mg/dL Insulin sensitivity factor 20 (1 unit brings blood sugar down 20 points) Victoza 1.8 mg QD Current orders for Inpatient glycemic control:  Lantus 15 units QHS Novolog 0-20 units TID/0-5 units QHS Novolog 6 units TID Solumedrol 40 mg Q6H  Inpatient Diabetes Program Recommendations:    MD-Please consider increasing Lantus to 48 units QD (80% of home dose).    Spoke with patient at bedside.  She confirms above home medications.  Last A1C was 8.0 on 01/30/21.  She is current with Dr. Janalyn Harder.  She watches her CHO intake and does not drink any beverages with sugar.  She states has improved her diet over the last several months and has lost a significant amount of weight.  Explained she should have her significant other bring in pump supplies prior to DC so she can transition to pump 24 hrs after Lantus (prior to DC) so she does not get too much basal and bottom out.  She verbalizes understanding.  Denies hypoglycemia at home.  She is aware of signs, symptoms and treatment.    Will continue to follow while inpatient.  Thank you, Reche Dixon, RN, BSN Diabetes  Coordinator Inpatient Diabetes Program [phone] (team pager from 8a-5p)

## 2021-03-27 NOTE — Progress Notes (Signed)
IV x 2 removed d/t pain and swelling.  IV team consult entered as pt is a difficult stick.

## 2021-03-27 NOTE — Progress Notes (Signed)
PROGRESS NOTE    Lynn [name]  LJQ:[id number] DOB: [birth year] DOA: 03/23/2021 PCP: [name], MD   Brief Narrative: 65 year old female with RA on methotrexate, diastolic heart failure, hypertension, hyperlipidemia, thalassemia type 2 diabetes admitted with fever of unknown origin. Patient reports having fever of 10 1-1 02 at home associated with some shortness of breath and nonproductive cough, she was started on doxycycline for possible bronchitis.  She continued to have fever in spite of antibiotics. Patient reports traveling to [place] for a funeral a month ago.  She has not had COVID but she has taken COVID-vaccine. CT of the chest abdomen and pelvis shows no evidence of acute or active infection. She is not on oxygen at home Procalcitonin is low, sed rate is 46 CRP is 2.8 She is a former smoker  Assessment & Plan:   Principal Problem:   Acute respiratory failure with hypoxia (HCC) Active Problems:   Essential hypertension   Chronic diastolic CHF (congestive heart failure) (HCC)   Type 2 diabetes mellitus with hyperglycemia, with long-term current use of insulin (HCC)   Acute bronchitis   SOB (shortness of breath)   FUO (fever of unknown origin)   Hypokalemia   Generalized weakness   Acute bacterial endocarditis   # Acute hypoxic respiratory failure-of unclear etiology.  Patient has no history of asthma or COPD.  She is a former smoker.   CT chest no evidence of pulmonary embolism infiltrates atelectasis or pleural effusion noted. She has a normal procalcitonin  Crp trending up 9.5 from 2.8 Esr trending up 68 from 47  LDH 197  CPK 213 BNP 197 Ferritin 130 D-dimer 0.55 WBC 14.6 from 8.6 prior to starting Solu-Medrol Hemoglobin 8.1 from 9.3 TSH 4.01  Chest x-ray 03/25/2021-persistent patchy nodular interstitial process in the lungs could be due to atypical/viral pneumonia or reactive airway disease/hypersensitivity pneumonitis/cryptogenic organizing  pneumonia COVID-negative flu negative respiratory virus panel negative, hepatitis panel normal, cryptococcal antigen negative HIV nonreactive.. Azithromycin started 03/24/2021 Rocephin started 03/25/2021 for possible CAP .   Vancomycin started 03/25/2021 for possible mitral valve endocarditis.  Renal functions need to be monitored closely.  Creatinine clearance 57. Discussed with PCCM cardiology and ID   Nebulizer treatments  ADDING solumedrol 6/12 as patient diffusely wheezing and loud wheezing. Echocardiogram-action fraction 60 to 65%.  Abnormal density in the mitral valve. TEE on 03/28/2021 at 130 at [facility] discussed with patient and husband  #History CAD nonobstructive/chronic diastolic heart failure -stable on hctz at home which has not been restarted .  Echo with ejection fraction 60 to 65%.  #History of essential hypertension on losartan and diltiazem.  Restarted   #type 2 dm -on insulin pump at home with Victoza.  Will increase SSI to resistant, Lantus 6 units nightly and 3 units of NovoLog 3 times a day prior to meals. CBG (last 3)  Recent Labs    03/27/21 0232 03/27/21 0733 03/27/21 1116  GLUCAP 394* 379* 451*   Will increase Lantus to 25 units nightly N.p.o. after midnight  #Chronic microcytic anemia/thalassemia Baseline hemoglobin 9-10 monitor daily.  Hemoglobin dropping to 8 with no signs of active bleeding.  Will check FOBT and iron panel  # fever of unknown origin- CT chest abdomen pelvis does not reveal any acute source for the fever.  Work up in progress concern for infective endocarditis with lesion in the mitral valve noted from transthoracic echo. MRI of the brain with possible AV malformation   # rule out mitral valve endocarditis blood  cultures negative.  Trans thoracic echo with density in the mitral valve.  Patient scheduled for transesophageal echo tomorrow at 1:30 PM.  #1 paroxysmal atrial tachycardia on Cardizem at home.  Patient went into a brief episode of  A. fib RVR and rate of 110-1 20s given Cardizem.  She converted to sinus rhythm in less than 3 hours. Holding off on any anticoagulation at this point per cardiology .Marland Kitchen   Estimated body mass index is 40.18 kg/m as calculated from the following:   Height as of this encounter: $RemoveBeforeD'5\' 4"'[id number]$  (1.626 m).   Weight as of this encounter: 106.2 kg.  DVT prophylaxis: lovenox Code Status: full Family Communication: Discussed with husband at bedside Disposition Plan:  Status is: Inpatient  Remains inpatient appropriate because:Persistent severe electrolyte disturbances  Dispo: The patient is from: Home              Anticipated d/c is to: Home              Patient currently is not medically stable to d/c.   Difficult to place patient No   Consultants: id  Procedures: none Antimicrobials: none  Subjective: Feels better than yesterday still dependent on 4 L of oxygen wheezing decreased since starting steroids however blood sugar has been high  She had some diarrhea overnight Objective: Vitals:   03/26/21 2054 03/27/21 0353 03/27/21 0850 03/27/21 1318  BP: 122/64 138/72  (!) 128/57  Pulse: 79 65 82 74  Resp: $Remo'20 20 20 16  'myyZg$ Temp: 97.6 F (36.4 C) 97.9 F (36.6 C)  97.6 F (36.4 C)  TempSrc: Oral Oral  Oral  SpO2: 93% 93%  98%  Weight:  106.2 kg    Height:        Intake/Output Summary (Last 24 hours) at 03/27/2021 1522 Last data filed at 03/27/2021 1100 Gross per 24 hour  Intake 240 ml  Output 300 ml  Net -60 ml    Filed Weights   03/24/21 2133 03/25/21 0500 03/27/21 0353  Weight: 106.9 kg 106.3 kg 106.2 kg    Examination:  General exam: in nad  Respiratory system: rhonchi b/l to auscultation. Respiratory effort normal. Cardiovascular system: S1 & S2 heard, RRR. No JVD, murmurs, rubs, gallops or clicks. No pedal edema. Gastrointestinal system: Abdomen is nondistended, soft and nontender. No organomegaly or masses felt. Normal bowel sounds heard. Central nervous system: Alert and  oriented. No focal neurological deficits. Extremities: Symmetric 5 x 5 power. Skin: No rashes, lesions or ulcers Psychiatry: Judgement and insight appear normal. Mood & affect appropriate.     Data Reviewed: I have personally reviewed following labs and imaging studies  CBC: Recent Labs  Lab 03/23/21 1301 03/26/21 0402 03/27/21 0351  WBC 8.6 14.6* 18.0*  NEUTROABS 6.0  --   --   HGB 9.3* 8.1* 8.0*  HCT 32.1* 27.3* 27.2*  MCV 63.6* 63.0* 63.6*  PLT 368 349 962    Basic Metabolic Panel: Recent Labs  Lab 03/23/21 1301 03/26/21 0402 03/26/21 2303 03/27/21 0351 03/27/21 1132  NA 137 132* 132* 133* 133*  K 3.3* 4.0 4.5 4.7 4.3  CL 102 100 99 99 97*  CO2 $Re'27 24 24 27 25  'efx$ GLUCOSE 115* 227* 429* 393* 467*  BUN $Re'14 15 20 19 22  'SyX$ CREATININE 1.05* 1.13* 1.30* 1.23* 1.11*  CALCIUM 9.0 8.7* 9.0 9.1 9.4    GFR: Estimated Creatinine Clearance: 58.5 mL/min (A) (by C-G formula based on SCr of 1.11 mg/dL (H)). Liver Function Tests: Recent Labs  Lab 03/23/21 1301 03/26/21 0402 03/27/21 0351  AST $Re'23 18 18  'ATN$ ALT $R'28 18 18  'ss$ ALKPHOS 69 62 68  BILITOT 0.4 0.7 0.6  PROT 7.4 7.1 7.5  ALBUMIN 3.5 3.2* 3.1*    No results for input(s): LIPASE, AMYLASE in the last 168 hours. No results for input(s): AMMONIA in the last 168 hours. Coagulation Profile: No results for input(s): INR, PROTIME in the last 168 hours. Cardiac Enzymes: Recent Labs  Lab 03/25/21 1126  CKTOTAL 213    BNP (last 3 results) Recent Labs    03/20/21 1220  PROBNP 57.0    HbA1C: No results for input(s): HGBA1C in the last 72 hours. CBG: Recent Labs  Lab 03/26/21 1836 03/26/21 2047 03/27/21 0232 03/27/21 0733 03/27/21 1116  GLUCAP 448* 407* 394* 379* 451*    Lipid Profile: No results for input(s): CHOL, HDL, LDLCALC, TRIG, CHOLHDL, LDLDIRECT in the last 72 hours. Thyroid Function Tests: No results for input(s): TSH, T4TOTAL, FREET4, T3FREE, THYROIDAB in the last 72 hours. Anemia Panel: Recent  Labs    03/25/21 1126  FERRITIN 130    Sepsis Labs: Recent Labs  Lab 03/24/21 0225  PROCALCITON <0.10     Recent Results (from the past 240 hour(s))  Resp Panel by RT-PCR (Flu A&B, Covid) Nasopharyngeal Swab     Status: None   Collection Time: 03/23/21  5:32 PM   Specimen: Nasopharyngeal Swab; Nasopharyngeal(NP) swabs in vial transport medium  Result Value Ref Range Status   SARS Coronavirus 2 by RT PCR NEGATIVE NEGATIVE Final    Comment: (NOTE) SARS-CoV-2 target nucleic acids are NOT DETECTED.  The SARS-CoV-2 RNA is generally detectable in upper respiratory specimens during the acute phase of infection. The lowest concentration of SARS-CoV-2 viral copies this assay can detect is 138 copies/mL. A negative result does not preclude SARS-Cov-2 infection and should not be used as the sole basis for treatment or other patient management decisions. A negative result may occur with  improper specimen collection/handling, submission of specimen other than nasopharyngeal swab, presence of viral mutation(s) within the areas targeted by this assay, and inadequate number of viral copies(<138 copies/mL). A negative result must be combined with clinical observations, patient history, and epidemiological information. The expected result is Negative.  Fact Sheet for Patients:  EntrepreneurPulse.com.au  Fact Sheet for Healthcare Providers:  IncredibleEmployment.be  This test is no t yet approved or cleared by the [place] FDA and  has been authorized for detection and/or diagnosis of SARS-CoV-2 by FDA under an Emergency Use Authorization (EUA). This EUA will remain  in effect (meaning this test can be used) for the duration of the COVID-19 declaration under Section 564(b)(1) of the Act, 21 U.S.C.section 360bbb-3(b)(1), unless the authorization is terminated  or revoked sooner.       Influenza A by PCR NEGATIVE NEGATIVE Final   Influenza B by  PCR NEGATIVE NEGATIVE Final    Comment: (NOTE) The Xpert Xpress SARS-CoV-2/FLU/RSV plus assay is intended as an aid in the diagnosis of influenza from Nasopharyngeal swab specimens and should not be used as a sole basis for treatment. Nasal washings and aspirates are unacceptable for Xpert Xpress SARS-CoV-2/FLU/RSV testing.  Fact Sheet for Patients: EntrepreneurPulse.com.au  Fact Sheet for Healthcare Providers: IncredibleEmployment.be  This test is not yet approved or cleared by the [place] FDA and has been authorized for detection and/or diagnosis of SARS-CoV-2 by FDA under an Emergency Use Authorization (EUA). This EUA will remain in effect (meaning this test can be used)  for the duration of the COVID-19 declaration under Section 564(b)(1) of the Act, 21 U.S.C. section 360bbb-3(b)(1), unless the authorization is terminated or revoked.  Performed at [facility], [place] [address], [place], [place] [address]   Urine culture     Status: Abnormal   Collection Time: 03/23/21  5:32 PM   Specimen: Urine, Clean Catch  Result Value Ref Range Status   Specimen Description   Final    URINE, CLEAN CATCH Performed at [facility], [place] [address], [place], [place] [address]    Special Requests   Final    NONE Performed at [facility], [place] [address] [place], [place] [address]    Culture (A)  Final    <10,000 COLONIES/mL INSIGNIFICANT GROWTH Performed at [place] [address] [place], [place] [address]    Report Status 03/25/2021 FINAL  Final  Culture, blood (Routine X 2) w Reflex to ID Panel     Status: None (Preliminary result)   Collection Time: 03/24/21  2:25 AM   Specimen: BLOOD  Result Value Ref Range Status   Specimen Description   Final    BLOOD BLOOD LEFT FOREARM Performed at [place] [address], [place], [place] [address]    Special  Requests   Final    BOTTLES DRAWN AEROBIC AND ANAEROBIC Blood Culture adequate volume Performed at [place] [address] [place], [place] [address]    Culture   Final    NO GROWTH 3 DAYS Performed at [facility], [place] [address], [place], [place] [address]    Report Status PENDING  Incomplete  Culture, blood (Routine X 2) w Reflex to ID Panel     Status: None (Preliminary result)   Collection Time: 03/24/21  2:30 AM   Specimen: BLOOD  Result Value Ref Range Status   Specimen Description   Final    BLOOD BLOOD RIGHT HAND Performed at [place] [address] [place], [place] [address]    Special Requests   Final    BOTTLES DRAWN AEROBIC AND ANAEROBIC Blood Culture adequate volume Performed at [place] [address] [place], [place] [address]    Culture   Final    NO GROWTH 3 DAYS Performed at [facility], [place] [address], [place], [place] [address]    Report Status PENDING  Incomplete  Culture, blood (routine x 2)     Status: None (Preliminary result)   Collection Time: 03/24/21 11:30 AM   Specimen: BLOOD  Result Value Ref Range Status   Specimen Description   Final    BLOOD LEFT ANTECUBITAL Performed at [facility], [place] [address], [place], [place] [address]    Special Requests   Final    BOTTLES DRAWN AEROBIC AND ANAEROBIC Blood Culture adequate volume Performed at [place] [address] [place], [place] [address]    Culture   Final    NO GROWTH 3 DAYS Performed at [facility], [place] [address] [place], [place] [address]    Report Status PENDING  Incomplete  MRSA PCR Screening     Status: None   Collection Time: 03/25/21  1:27 PM   Specimen: Nasal Mucosa; Nasopharyngeal  Result Value Ref Range Status   MRSA by PCR NEGATIVE NEGATIVE Final    Comment:        The GeneXpert MRSA Assay (FDA approved  for NASAL specimens only), is one component  of a comprehensive MRSA colonization surveillance program. It is not intended to diagnose MRSA infection nor to guide or monitor treatment for MRSA infections. Performed at [facility], [place] [address] [place], [place] [address]   Respiratory (~20 pathogens) panel by PCR     Status: None   Collection Time: 03/26/21  7:50 AM  Result Value Ref Range Status   Adenovirus NOT DETECTED NOT DETECTED Final   Coronavirus 229E NOT DETECTED NOT DETECTED Final    Comment: (NOTE) The Coronavirus on the Respiratory Panel, DOES NOT test for the novel  Coronavirus (2019 nCoV)    Coronavirus HKU1 NOT DETECTED NOT DETECTED Final   Coronavirus NL63 NOT DETECTED NOT DETECTED Final   Coronavirus OC43 NOT DETECTED NOT DETECTED Final   Metapneumovirus NOT DETECTED NOT DETECTED Final   Rhinovirus / Enterovirus NOT DETECTED NOT DETECTED Final   Influenza A NOT DETECTED NOT DETECTED Final   Influenza B NOT DETECTED NOT DETECTED Final   Parainfluenza Virus 1 NOT DETECTED NOT DETECTED Final   Parainfluenza Virus 2 NOT DETECTED NOT DETECTED Final   Parainfluenza Virus 3 NOT DETECTED NOT DETECTED Final   Parainfluenza Virus 4 NOT DETECTED NOT DETECTED Final   Respiratory Syncytial Virus NOT DETECTED NOT DETECTED Final   Bordetella pertussis NOT DETECTED NOT DETECTED Final   Bordetella Parapertussis NOT DETECTED NOT DETECTED Final   Chlamydophila pneumoniae NOT DETECTED NOT DETECTED Final   Mycoplasma pneumoniae NOT DETECTED NOT DETECTED Final    Comment: Performed at [facility], [place]. [address] [place], [place] [address]          Radiology Studies: MR BRAIN W WO CONTRAST  Result Date: 03/26/2021 CLINICAL DATA:  Respiratory failure, possible endocarditis EXAM: MRI HEAD WITHOUT AND WITH CONTRAST TECHNIQUE: Multiplanar, multiecho pulse sequences of the brain and surrounding structures were obtained without and with intravenous contrast. CONTRAST:  34mL GADAVIST GADOBUTROL 1  MMOL/ML IV SOLN COMPARISON:  Correlation made with CT from 2018 FINDINGS: Brain: There is no acute infarction or intracranial hemorrhage. There is no intracranial mass, mass effect, or edema. Chronic left occipital volume loss with corresponding susceptibility reflecting mineralization noted on prior CT. Cortical enhancement in this region is noted along with increased cortical veins. There is relative prominence of the left basal vein and internal cerebral vein. There is no additional abnormal enhancement. There is no hydrocephalus or extra-axial fluid collection. Ventricles and sulci are normal in size and configuration. Vascular: Major vessel flow voids at the skull base are preserved. Skull and upper cervical spine: Normal marrow signal is preserved. Sinuses/Orbits: Paranasal sinuses are aerated. Orbits are unremarkable. Other: Sella is partially empty. Minor patchy mastoid fluid opacification. IMPRESSION: No evidence of septic emboli are other acute abnormality. Chronic left parietal volume loss with gyral calcification. Increased cortical veins in this region and relative prominence of left deep cerebral veins raise possibility of a dural arteriovenous fistula. Electronically Signed   By: Macy Mis M.D.   On: 03/26/2021 18:21   DG CHEST PORT 1 VIEW  Result Date: 03/26/2021 CLINICAL DATA:  Increasing shortness of breath. EXAM: PORTABLE CHEST 1 VIEW COMPARISON:  03/25/2021, 03/23/2021 and CT 03/21/2021 FINDINGS: Lungs are adequately inflated with persistent hazy bilateral interstitium which may be due to mild interstitial edema versus atypical infection. No effusion. Cardiomediastinal silhouette is unchanged. Focal calcification over the mitral valve annulus unchanged. Remainder of the exam is unchanged. IMPRESSION: Persistent hazy bilateral interstitium which may be  due to interstitial edema versus atypical infection. Electronically Signed   By: Marin Olp M.D.   On: 03/26/2021 14:46         Scheduled Meds:  arformoterol  15 mcg Nebulization BID   atorvastatin  80 mg Oral q1800   azithromycin  500 mg Oral Daily   diltiazem  60 mg Oral Q8H   fluticasone  1 spray Each Nare Daily   insulin aspart  0-20 Units Subcutaneous TID WC   insulin aspart  0-5 Units Subcutaneous QHS   insulin aspart  6 Units Subcutaneous TID WC   insulin glargine  48 Units Subcutaneous Daily   loratadine  10 mg Oral Daily   losartan  100 mg Oral Daily   methylPREDNISolone (SOLU-MEDROL) injection  40 mg Intravenous Q6H   multivitamin with minerals  1 tablet Oral Daily   revefenacin  175 mcg Nebulization Daily   Continuous Infusions:  cefTRIAXone (ROCEPHIN)  IV 2 g (03/27/21 0748)   vancomycin 1,000 mg (03/26/21 1712)     LOS: 4 days    Time spent: 70 min  Georgette Shell, MD 03/27/2021, 3:22 PM

## 2021-03-27 NOTE — Progress Notes (Signed)
NAME:  Lynn [name], MRN:  [id number], DOB:  1956/09/29, LOS: 4 ADMISSION DATE:  03/23/2021, CONSULTATION DATE:  03/25/21 REFERRING MD:  Gwendlyn Deutscher CHIEF COMPLAINT:  respiratory failure, fevers   History of Present Illness:  Lynn [name] is a 65 year old woman with rheumatoid arthritis on methotrexate, HFpEF, hypertension and chronic anemia due to thalassemia, DMII who was admitted on 03/23/21 with acute hypoxemic respiratory failure.   She has been treated with doxycycline and azithromycin for concern of bronchitis. She has spiked fevers since admission with a Tmax of 101.74F. She has a dry cough.   CTA chest on 6/7 did not show pulmonary emboli. She has scattered diffuse areas of ground glass attenuation. Covid and influenz PCR is negative.   PCCM has been consulted for further evaluation and recommendations of her respiratory failure and fevers.   Patient reports having fevers, sweats and chills over the past 3 weeks.  She also reports lack of appetite and about a 10 pound weight loss.  She also reports increased knee pain where she had a prior knee replacement and also increased lower back pain.  She raised concerns for silicosis and she does not have any risk factors for this disease as she has been a bus driver.  No exposure to stone cutting or other occupations of the sort.  Pertinent  Medical History  Rheumatoid Arthritis Chronic Diastolic Heart Failure Hypertension Microcytic Anemia - Thalassemia DMII  Significant Hospital Events: Including procedures, antibiotic start and stop dates in addition to other pertinent events   Admitted 03/23/2021 6/11 with increasing oxygen needs to 5L  Antibiotics: Doxycycline 6/9 - 6/10 Azithromycin 6/10 >> Ceftriaxone 6/11>> Vancomycin 6/11>>  Interim History / Subjective:   Feels a little better today Cough is better Expectoration of white phlegm  Objective   Blood pressure 138/72, pulse 82, temperature 97.9 F  (36.6 C), temperature source Oral, resp. rate 20, height 5' 4" (1.626 m), weight 106.2 kg, SpO2 93 %.    FiO2 (%):  [94 %] 94 %   Intake/Output Summary (Last 24 hours) at 03/27/2021 0950 Last data filed at 03/27/2021 1093 Gross per 24 hour  Intake 240 ml  Output --  Net 240 ml   Filed Weights   03/24/21 2133 03/25/21 0500 03/27/21 0353  Weight: 106.9 kg 106.3 kg 106.2 kg    Examination: General: Obese, does not appear to be in distress at rest HENT: Moist oral mucosa Lungs: Basal rales, largely clear Cardiovascular: S1-S2 appreciated Abdomen: Soft, bowel sounds appreciated Extremities: No edema, warm Neuro: Alert and oriented x3, moving all extremities GU: Deferred  Labs/imaging that I have personally reviewed  (right click and "Reselect all SmartList Selections" daily)  CRP 2.8, ESR , LDH 197, CK 213, Ferritin 130, BNP 57  UA - negative for protein, nitrite, small hgb - 11-20 BRCs  Procal <0.1 Blood culture 6/10 >> Urine Culture - too many colonies Covid/influenza - negative  CT Abdomen 03/23/21 1. No acute abnormality identified in the abdomen and pelvis. 2. Fatty infiltration of liver. 3. Diverticulosis of colon without diverticulitis. 4. Aortic atherosclerosis.  CTA Chest 03/23/21 Emboli noted.  No mediastinal or hilar adenopathy.  Scattered groundglass attenuation scattered bilaterally concerning for air trapping.  Echo 03/23/21 EF 60 to 65%.  Moderate left ventricular hypertrophy.  RV systolic function is normal, RV size is normal.  Mild elevated pulmonary artery systolic pressure estimated at 37.3 mmHg.  Mitral valve is abnormal with ill-defined density measuring 3.23 x 2.10 cm.  CXR 6/11 -increased interstitial prominence bilaterally  CXR 6/12 -persistent interstitial prominence bilaterally, no new opacities  Resolved Hospital Problem list     Assessment & Plan:  Acute hypoxemic respiratory failure -Continue to wean oxygen supplementation -Differential  diagnosis includes atypical pneumonia, interstitial process, pulmonary edema -Work-up so far negative -Plan is to continue antibiotics at present currently on ceftriaxone, vancomycin, azithromycin -Trying to coordinate bronchoscopy, to be performed following TEE -BAL for cell count, respiratory culture, fungal culture, PJP PCR -Will continue to follow inflammatory work-up  Concern for endocarditis -Evaluated by cardiology -Echocardiogram result noted -TEE pending -Blood cultures from 6/10 remain negative  Leukocytosis -Likely related to steroids Best practice (right click and "Reselect all SmartList Selections" daily)  Per primary team  Labs   CBC: Recent Labs  Lab 03/20/21 1220 03/23/21 1301 03/26/21 0402 03/27/21 0351  WBC 7.6 8.6 14.6* 18.0*  NEUTROABS  --  6.0  --   --   HGB 9.5* 9.3* 8.1* 8.0*  HCT 30.6* 32.1* 27.3* 27.2*  MCV 60.5 Repeated and verified X2.* 63.6* 63.0* 63.6*  PLT 353.0 368 349 244    Basic Metabolic Panel: Recent Labs  Lab 03/20/21 1220 03/23/21 1301 03/26/21 0402 03/26/21 2303 03/27/21 0351  NA 137 137 132* 132* 133*  K 4.0 3.3* 4.0 4.5 4.7  CL 99 102 100 99 99  CO2 _0 GLUCOSE 135* 115* 227* 429* 393*  BUN _1 CREATININE 1.19 1.05* 1.13* 1.30* 1.23*  CALCIUM 9.5 9.0 8.7* 9.0 9.1   GFR: Estimated Creatinine Clearance: 52.8 mL/min (A) (by C-G formula based on SCr of 1.23 mg/dL (H)). Recent Labs  Lab 03/20/21 1220 03/23/21 1301 03/24/21 0225 03/26/21 0402 03/27/21 0351  PROCALCITON  --   --  <0.10  --   --   WBC 7.6 8.6  --  14.6* 18.0*    Liver Function Tests: Recent Labs  Lab 03/20/21 1220 03/23/21 1301 03/26/21 0402 03/27/21 0351  AST _2 ALT _3 ALKPHOS 80 69 62 68  BILITOT 0.5 0.4 0.7 0.6  PROT 7.2 7.4 7.1 7.5  ALBUMIN 4.1 3.5 3.2* 3.1*   No results for input(s): LIPASE, AMYLASE in the last 168 hours. No results for input(s): AMMONIA in the last 168 hours.  ABG     Component Value Date/Time   HCO3 25.6 03/24/2021 0230   TCO2 28 07/03/2017 0814   O2SAT 99.0 03/24/2021 0230     Coagulation Profile: No results for input(s): INR, PROTIME in the last 168 hours.  Cardiac Enzymes: Recent Labs  Lab 03/25/21 1126  CKTOTAL 213    HbA1C: Hemoglobin A1C  Date/Time Value Ref Range Status  01/30/2021 03:24 PM 8.0 (A) 4.0 - 5.6 % Final  09/23/2020 02:54 PM 8.5 (A) 4.0 - 5.6 % Final   Hgb A1c MFr Bld  Date/Time Value Ref Range Status  03/21/2020 02:52 PM 9.8 (H) 4.6 - 6.5 % Final    Comment:    Glycemic Control Guidelines for People with Diabetes:Non Diabetic:  <6%Goal of Therapy: <7%Additional Action Suggested:  >8%   07/15/2019 02:29 PM 10.5 (H) 4.6 - 6.5 % Final    Comment:    Glycemic Control Guidelines for People with Diabetes:Non Diabetic:  <6%Goal of Therapy: <7%Additional Action Suggested:  >8%     CBG: Recent Labs  Lab 03/26/21 1713 03/26/21 1836 03/26/21 2047 03/27/21 0232 03/27/21 0733  GLUCAP 445* 448* 407* 394* 379*  Sherrilyn Rist, MD [place] [facility] Pager: [phone]

## 2021-03-27 NOTE — Progress Notes (Signed)
Per d/w [employer], TEE is scheduled for tomorrow at 1:30pm at [facility] with Dr. Acie Fredrickson. PCCM team made aware so they can coordinate bronchoscopy thereafter with Dr. Carlis Abbott. I also relayed this update to IM Dr. Rodena Piety requesting assistance with DM regimen since her insulin has been titrated today and she will be NPO after midnight. Mireille Lacombe PA-C

## 2021-03-27 NOTE — Progress Notes (Signed)
Physical Therapy Treatment Patient Details Name: Lynn [name] MRN: [id number] DOB: [birth year] Today's Date: 03/27/2021    History of Present Illness 65 yo female admitted with acute respiratory failure, Afib, fever. Ruling out endocarditis. Hx of DM, CAD, RA    PT Comments    Pt participated well. She has increased O2 needs currently. Physicians are currently ruling out endocarditis per chart review. Will continue to follow.    Follow Up Recommendations  No PT follow up     Equipment Recommendations  3in1 (PT)    Recommendations for Other Services       Precautions / Restrictions Precautions Precaution Comments: monitor o2 Restrictions Weight Bearing Restrictions: No    Mobility  Bed Mobility Overal bed mobility: Independent                  Transfers Overall transfer level: Modified independent                  Ambulation/Gait Ambulation/Gait assistance: Min guard Gait Distance (Feet): 200 Feet Assistive device: None       General Gait Details: mildly unsteady; no lob. O2 89% on 6L, 93% on 8L. Dyspnea 2/4   Stairs             Wheelchair Mobility    Modified Rankin (Stroke Patients Only)       Balance Overall balance assessment: Mild deficits observed, not formally tested                                          Cognition Arousal/Alertness: Awake/alert Behavior During Therapy: WFL for tasks assessed/performed Overall Cognitive Status: Within Functional Limits for tasks assessed                                        Exercises      General Comments        Pertinent Vitals/Pain Pain Assessment: No/denies pain    Home Living                      Prior Function            PT Goals (current goals can now be found in the care plan section) Progress towards PT goals: Progressing toward goals    Frequency    Min 3X/week      PT Plan Current plan remains  appropriate    Co-evaluation              AM-PAC PT "6 Clicks" Mobility   Outcome Measure  Help needed turning from your back to your side while in a flat bed without using bedrails?: None Help needed moving from lying on your back to sitting on the side of a flat bed without using bedrails?: None Help needed moving to and from a bed to a chair (including a wheelchair)?: None Help needed standing up from a chair using your arms (e.g., wheelchair or bedside chair)?: None Help needed to walk in hospital room?: A Little Help needed climbing 3-5 steps with a railing? : A Little 6 Click Score: 22    End of Session Equipment Utilized During Treatment: Oxygen Activity Tolerance: Patient tolerated treatment well Patient left: in bed;with call bell/phone within reach;with family/visitor present   PT Visit Diagnosis: Difficulty in walking,  not elsewhere classified (R26.2)     Time: 7711-6579 PT Time Calculation (min) (ACUTE ONLY): 13 min  Charges:  $Gait Training: 8-22 mins                        Doreatha Massed, PT Acute Rehabilitation  Office: [phone] Pager: [phone]

## 2021-03-27 NOTE — Progress Notes (Signed)
Due to issues with CBG and amount of insulin given on day shift and night shift; CBG rechecked to ensure there are no signs of hypoglycemia.  CBG is 394 at this time and MD is aware. New orders received.  Results for Lynn [name], Lynn "PAT" (MRN [id number]) as of 03/27/2021 02:44  Ref. Range 03/27/2021 02:32  Glucose-Capillary Latest Ref Range: 70 - 99 mg/dL 394 (H)

## 2021-03-27 NOTE — Consult Note (Addendum)
Cardiology Consultation:   Patient ID: Lynn [name] MRN: [id number]; DOB: 12-26-1955  Admit date: 03/23/2021 Date of Consult: 03/27/2021  PCP:  [name], MD   [facility] Providers Cardiologist:  Glenetta Hew, MD        Patient Profile:   Lynn [name] is a 65 y.o. female with a hx of RA, CAD, presumed coronary vasospasm, mitral stenosis, anxiety, stomach ulcers, HTN, HLD, former tobacco use, microcytic anemia, OSA, probable CKD stage IIIa by labs, seizures, thalassemia, DM2, PAT who is being seen 03/27/2021 for the evaluation of possible endocarditis at the request of Dr. Erin Fulling. She is also noted to have been in atrial fibrillation which is new.  History of Present Illness:   Lynn [name] has a prior history of chest pain with cath 11/2016 showing moderate-heavy coronary calcification, 40-50% LAD, 30-50% prox LAD, fluoroscopy demonstrating inferolateral calcified mass felt to reflect MAC, spontaneous runs of SVT. F/u event monitor 03/2017 showed mostly NSR with average rate 77bpm, multifoocal PVCs with some couplets/bigeminy, brief run/triplet, rare PACs, 7 PAT runs. She has been treated for coronary vasospasm with diltiazem and Imdur. 2D echo 07/2017 confirmed heavy posterior mitral annular calcification with moderate stenosis. She has subsequently been followed by serial echocardiograms.  She presented to [facility] with near-daily fever over 3 weeks span of time, with development of associated chills, sweats, SOB, cough (initially productive then dry without hemoptysis), wheezing, 10lb weight loss and nausea. She has also had atypical sharp chest discomfort with coughing or lying on her left side. No typical angina. She had done 3 home rapid covid tests and they were negative. She saw PCP for this. Troponin was negative. D-dimer was mildly elevated and outpatient CTA showed no PE, + cardiomegaly and CAD, scattered area of GGO/suspected atx. Due to persistent/worsening  sx she came to the hospital where she was found to have acute hypoxemic respiratory failure requiring supplemental O2. Here at [facility], labs notable for leukocytosis, anemia (Hgb 8 with microcytosis), pseudohyponatremia (corrects to normal for glucose), BNP 57->152, CRP 9.5, ESR 68, procalcitonin <0.10, d-dimer trending upward to 2.65, TSH wnl, Covid and RVP wnl. Initial CXR 6/9 NAD. CT abd/pelvis with fatty liver and diverticulosis but otherwise nonacute. Subsequent CXR 6/11 showed patchy nodular interstitial process in the lungs could be due to atypical/viral pneumonia, reactive airways disease, hypersensitivity pneumonitis or cryptogenic organizing pneumonia. MRI of the brain showed no septic emboli, "Chronic left parietal volume loss with gyral calcification. Increased cortical veins in this region and relative prominence of left deep cerebral veins raise possibility of a dural arteriovenous fistula." 2D Echo 60-65%, moderate LVH, grade 2 DD, mildly elevated PASP, mildly dilated LA, mitral valve abnormal with ill defined density measuring 3.23 x 2.10cm that appears to originate off the posterior MV leaflet but cannot be definite on this, also MAC present with moderate MS. She is being treated with empiric antibiotics with differential felt to be atypical PNA vs interstitial lung process vs pulmonary edema. ID also following with extensive infectious w/u underway. PCCM recommends plan for bronchoscopy followed by a TEE.  EKG yesterday showed atrial fib (previous EKG scanned from 6/3 was NSR) - this appears to have been for approximately 3 hr before converting back to NSR. She did not have a significant change in HR at the time.   Past Medical History:  Diagnosis Date   Anxiety    Arthritis    "knees, wrists, back, elbows" (07/03/2017)   Clotting disorder (HCC)    blood clot  in eye 2016   Coronary artery disease, non-occlusive    a. 11/2016 NSTEMI/Cath: LM nl, LAD 40p, 35m, D1/2 small, LCX 40ost, OM2/3  nl/small, RCA 35 ost/mid, RPDA/RPL small/nl.   Coronary vasospasm (HCC)    Depression    Diastolic dysfunction    a. 11/2016 Echo: EF 55-60%, gr1 DD, sev calcified MV annulus, mildly dil LA.   Eye hemorrhage 01/2015   "right; resolved" (07/03/2017)   Headache    "monthly" (07/03/2017)   Heart murmur    History of blood transfusion    "when I had an ectopic pregnancy"   History of stomach ulcers    Hyperlipidemia    Hypertension    Microcytic anemia    Mitral stenosis    OSA on CPAP    setting is unknown   PAT (paroxysmal atrial tachycardia) (HCC)    Pneumonia    "several times" (07/03/2017)   Premature atrial contractions    PVC's (premature ventricular contractions)    Syncope 07/03/2017 X 2   called seizures but no medications   Thalassemia    "my cells are sickle cell shaped but I don't have sickle cell anemia" (07/03/2017)   Type II diabetes mellitus ([facility]     Past Surgical History:  Procedure Laterality Date   48-Hour Monitor  03/18/2017   Sinus rhythm with sinus tachycardia (rate 58-134 BPM)multiple PVCs noted with couplets and bigeminy. One triplet. 7 runs of PAT ranging from 100-130 bpm. Longest was 33 beats.   BREAST BIOPSY Left    CARDIAC CATHETERIZATION N/A 11/19/2016   Procedure: Left Heart Cath and Coronary Angiography;  Surgeon: HBelva Crome MD;  [facility];  Service: Cardiovascular.    LM nl, LAD 40p, 539m D1/2 small, LCX 40ost, OM2/3 nl/small, RCA 35 ost/mid, RPDA/RPL small/nl.   CARPAL TUNNEL RELEASE Right 11/01/2014   COLONOSCOPY     DILATION AND CURETTAGE OF UTERUS     ECTOPIC PREGNANCY SURGERY  X 2   JOINT REPLACEMENT     KNEE ARTHROSCOPY Left 11/2014   TONSILLECTOMY     TOTAL KNEE ARTHROPLASTY Right 02/18/2015   Procedure: RIGHT TOTAL KNEE ARTHROPLASTY;  Surgeon: StNetta CedarsMD;  [place] [facility];  Laterality: Right;   TOTAL KNEE ARTHROPLASTY Left 08/19/2015   Procedure: LEFT TOTAL KNEE ARTHROPLASTY;  Surgeon:  StNetta CedarsMD;  [place] [facility];  Laterality: Left;   TRANSTHORACIC ECHOCARDIOGRAM  11/2016; 07/2017:   a) normal LV size, thickness and function. EF 55-60%. GR 1 DD.No RWMA severe mitral annular calcification, but no mitral stenosis. Mild LA dilation;; b) normal LV size and function.  EF 65-75%.  Unable to assess diastolic function.  Aortic sclerosis with no stenosis.  Moderate mitral stenosis? (Gradient 9 mmHg) -?  Mild-mod left atrial enlargement    TRANSTHORACIC ECHOCARDIOGRAM  01/2019   EF 65 to 70%.  Normal function.  Elevated LVEDP-GR 1 DD.  Normal RV size and function.  Large calcific mass on posterior mitral leaflet mild to moderate mitral stenosis.   VAGINAL HYSTERECTOMY       Home Medications:  Prior to Admission medications   Medication Sig Start Date End Date Taking? Authorizing Provider  acetaminophen (TYLENOL) 500 MG tablet Take 500 mg by mouth every 6 (six) hours as needed for fever.   Yes [provider]  atorvastatin (LIPITOR) 80 MG tablet TAKE 1 TABLET(80 MG) BY MOUTH DAILY AT 6 PM Patient taking differently: Take 80 mg by mouth daily. 02/20/21  Yes Copland, Gay Filler, MD  cetirizine (ZYRTEC) 10 MG tablet Take 10 mg by mouth daily as needed for allergies.   Yes [provider]  diltiazem (CARDIZEM CD) 240 MG 24 hr capsule Take 1 capsule (240 mg total) by mouth daily. 08/09/20 03/23/21 Yes Leonie Man, MD  doxycycline (VIBRAMYCIN) 100 MG capsule Take 1 capsule (100 mg total) by mouth 2 (two) times daily. Patient taking differently: Take 100 mg by mouth 2 (two) times daily. Start date : 03/20/21 03/20/21  Yes Copland, Gay Filler, MD  folic acid (FOLVITE) 1 MG tablet Take 2 tablets (2 mg total) by mouth daily. Patient taking differently: Take 1 mg by mouth 2 (two) times daily. 01/21/20  Yes Copland, Gay Filler, MD  Insulin Disposable Pump (OMNIPOD DASH PODS, GEN 4,) MISC Inject 1 Dose into the skin continuous. Using pump - depends on carb  intake. 10/03/20  Yes [provider]  insulin lispro (HUMALOG) 100 UNIT/ML injection Inject 8 Units into the skin See admin instructions. Back up for pump. 02/07/21  Yes [provider]  losartan (COZAAR) 100 MG tablet Take 1 tablet (100 mg total) by mouth daily. 08/09/20 03/23/21 Yes Leonie Man, MD  methotrexate 50 MG/2ML injection Inject 50 mg/m2 into the skin once a week. 08/11/19  Yes [provider]  Multiple Vitamin (MULTIVITAMIN WITH MINERALS) TABS tablet Take 1 tablet by mouth daily.   Yes [provider]  VICTOZA 18 MG/3ML SOPN INJECT 1.8 MG UNDER THE SKIN ONCE DAILY Patient taking differently: Inject 8.1 mg into the skin daily. 02/20/21  Yes Copland, Gay Filler, MD  Continuous Blood Gluc Sensor (DEXCOM G6 SENSOR) MISC 1 Device by Does not apply route as directed. 06/17/20   [name], Melanie Crazier, MD  Continuous Blood Gluc Transmit (DEXCOM G6 TRANSMITTER) MISC 1 Device by Does not apply route as directed. 06/17/20   [name], Melanie Crazier, MD  fluticasone (FLOVENT DISKUS) 50 MCG/BLIST diskus inhaler Inhale 1 puff into the lungs 2 (two) times daily. 03/17/21   Hazel Sams, PA-C  glucose blood (CONTOUR NEXT TEST) test strip 3x daily 07/20/19   [name], Melanie Crazier, MD  hydrochlorothiazide (HYDRODIURIL) 12.5 MG tablet Take one by mouth daily Patient not taking: No sig reported 01/21/20   Copland, Gay Filler, MD  Insulin Pen Needle (B-D UF III MINI PEN NEEDLES) 31G X 5 MM MISC USE DAILY WITH VICTOZA AND LANTUS. 07/20/19   [name], Melanie Crazier, MD  Insulin Pen Needle 31G X 5 MM MISC 1 Device by Does not apply route 3 (three) times daily. 01/21/20   Copland, Gay Filler, MD  isosorbide mononitrate (IMDUR) 30 MG 24 hr tablet Take 30 mg  tablet as needed for Chest pain spasm episodes for 3 days and the stop Patient not taking: No sig reported 08/09/20   Leonie Man, MD  Lancets 30G MISC 1 Device by Does not apply route 3 (three) times daily.  07/16/19   Copland, Gay Filler, MD  SAFETY-LOK TB SYRINGE 27GX.5" 27G X 1/2" 1 ML MISC  08/18/19   [provider]  Zoster Vaccine Adjuvanted [facility]) injection Inject into the muscle. Patient taking differently: Inject 0.5 mLs into the muscle once. 02/20/21   Carlyle Basques, MD    Inpatient Medications: Scheduled Meds:  arformoterol  15 mcg Nebulization BID   atorvastatin  80 mg Oral q1800   azithromycin  500 mg Oral Daily   diltiazem  60 mg Oral Q8H   fluticasone  1 spray Each  Nare Daily   insulin aspart  0-20 Units Subcutaneous TID WC   insulin aspart  0-5 Units Subcutaneous QHS   insulin aspart  6 Units Subcutaneous TID WC   insulin glargine  15 Units Subcutaneous QHS   loratadine  10 mg Oral Daily   losartan  100 mg Oral Daily   methylPREDNISolone (SOLU-MEDROL) injection  40 mg Intravenous Q6H   multivitamin with minerals  1 tablet Oral Daily   revefenacin  175 mcg Nebulization Daily   Continuous Infusions:  cefTRIAXone (ROCEPHIN)  IV 2 g (03/27/21 0748)   vancomycin 1,000 mg (03/26/21 1712)   PRN Meds: acetaminophen **OR** acetaminophen, albuterol, guaiFENesin-dextromethorphan, HYDROcodone bit-homatropine, ondansetron (ZOFRAN) IV  Allergies:    Allergies  Allergen Reactions   Penicillins Hives    Childhood allergy Has patient had a PCN reaction causing immediate rash, facial/tongue/throat swelling, SOB or lightheadedness with hypotension: Yes Has patient had a PCN reaction causing severe rash involving mucus membranes or skin necrosis: No Has patient had a PCN reaction that required hospitalization No Has patient had a PCN reaction occurring within the last 10 years: No If all of the above answers are "NO", then may proceed with Cephalosporin use.   [name] [Dapagliflozin] Other (See Comments)    Dizzy and lethargic    Metformin And Related     Diarrhea, bleeding    Social History:   Social History   Socioeconomic History   Marital status: Married     Spouse name: Not on file   Number of children: Not on file   Years of education: Not on file   Highest education level: Not on file  Occupational History   Occupation: retired  Tobacco Use   Smoking status: Former    Packs/day: 0.25    Years: 44.00    Pack years: 11.00    Types: Cigarettes    Quit date: 02/17/2015    Years since quitting: 6.1   Smokeless tobacco: Never  Vaping Use   Vaping Use: Never used  Substance and Sexual Activity   Alcohol use: Yes    Alcohol/week: 0.0 standard drinks    Comment: 07/03/2017 "might have a few drinks/year"   Drug use: No   Sexual activity: Never  Other Topics Concern   Not on file  Social History Narrative   Not on file   Social Determinants of Health   Financial Resource Strain: Not on file  Food Insecurity: Not on file  Transportation Needs: Not on file  Physical Activity: Not on file  Stress: Not on file  Social Connections: Not on file  Intimate Partner Violence: Not on file    Family History:   Family History  Problem Relation Age of Onset   Cancer Mother    Diabetes Mother    Hypertension Mother    Hyperlipidemia Mother    Cancer Father    Hyperlipidemia Father    Mental illness Sister    Diabetes Sister    Diabetes Maternal Grandmother    Diabetes Maternal Grandfather    Colon cancer Neg Hx    Esophageal cancer Neg Hx    Stomach cancer Neg Hx    Rectal cancer Neg Hx      ROS:  Please see the history of present illness.   All other ROS reviewed and negative.     Physical Exam/Data:   Vitals:   03/26/21 1932 03/26/21 2054 03/27/21 0353 03/27/21 0850  BP:  122/64 138/72   Pulse:  79 65 82  Resp:  _0 Temp:  97.6 F (36.4 C) 97.9 F (36.6 C)   TempSrc:  Oral Oral   SpO2: 92% 93% 93%   Weight:   106.2 kg   Height:       No intake or output data in the 24 hours ending 03/27/21 0901 Last 3 Weights 03/27/2021 03/25/2021 03/24/2021  Weight (lbs) 234 lb 1.6 oz 234 lb 5.6 oz 235 lb 10.8 oz  Weight (kg)  106.187 kg 106.3 kg 106.9 kg     Body mass index is 40.18 kg/m.  General: Well developed, well nourished AAF in no acute distress. Head: Normocephalic, atraumatic, sclera non-icteric, no xanthomas, nares are without discharge. Neck: Negative for carotid bruits. JVP not elevated. Lungs: Coarse BS bilaterally to auscultation without wheezes or rhonchi. Breathing is unlabored. Heart: RRR S1 S2 without murmurs, rubs, or gallops.  Abdomen: Soft, non-tender, non-distended with normoactive bowel sounds. No rebound/guarding. Extremities: No clubbing or cyanosis. No edema. Distal pedal pulses are 2+ and equal bilaterally. Neuro: Alert and oriented X 3. Moves all extremities spontaneously. Psych:  Responds to questions appropriately with a normal affect.  EKG:  The EKG was personally reviewed and demonstrates:  atrial fibrillation 106bpm, nonspecific STTW changes Telemetry:  Telemetry was personally reviewed and demonstrates:  outlined above  Relevant CV Studies: 2D echo 03/2021    1. Left ventricular ejection fraction, by estimation, is 60 to 65%. The  left ventricle has normal function. The left ventricle has no regional  wall motion abnormalities. There is moderate left ventricular hypertrophy  of the basal-septal segment. Left  ventricular diastolic parameters are consistent with Grade II diastolic  dysfunction (pseudonormalization). Elevated left ventricular end-diastolic  pressure.   2. Right ventricular systolic function is normal. The right ventricular  size is normal. There is mildly elevated pulmonary artery systolic  pressure. The estimated right ventricular systolic pressure is 88.8 mmHg.   3. Left atrial size was mildly dilated.   4. The mitral valve is abnormal. There is an ill defined density  measuring 3.23 x 2.10cm that appears to originate off the posterior MV  leaflet but cannot be definite on this. There is also mitral annular  calcification present. There is moderate  mitral  stenosis with a mean MVG of 73mHg. No evidence of mitral valve  regurgitation.   5. The aortic valve is normal in structure. Aortic valve regurgitation is  not visualized. No aortic stenosis is present.   6. The inferior vena cava is normal in size with greater than 50%  respiratory variability, suggesting right atrial pressure of 3 mmHg.   7. Recommend TEE for possible mitral valve endocarditis in setting of  abnormal mitral valve and fevers.   LHC 2018 Moderate to heavy coronary calcification. Segmental 40-50% LAD narrowing. 30-50% proximal to mid RCA. Coronaries are widely patent without high-grade stenosis. Fluoroscopy demonstration of inferolateral calcified mass, likely representing mitral annular calcification. Consider CT or MRI if there is not heavy mitral valve calcification on echocardiography. Spontaneous runs of supraventricular tachycardia during the procedure.     RECOMMENDATIONS:   No significant obstructive coronary disease is identified. Etiology of enzyme elevation and clinical presentation could potentially be supraventricular arrhythmia superimposed on the cardiac substrate noted above.     Laboratory Data:  High Sensitivity Troponin:  No results for input(s): TROPONINIHS in the last 720 hours.   Chemistry Recent Labs  Lab 03/26/21 0402 03/26/21 2303 03/27/21 0351  NA 132* 132* 133*  K 4.0 4.5 4.7  CL 100  99 99  CO2 _0 GLUCOSE 227* 429* 393*  BUN _1 CREATININE 1.13* 1.30* 1.23*  CALCIUM 8.7* 9.0 9.1  GFRNONAA 53* 45* 48*  ANIONGAP _2 Recent Labs  Lab 03/23/21 1301 03/26/21 0402 03/27/21 0351  PROT 7.4 7.1 7.5  ALBUMIN 3.5 3.2* 3.1*  AST _3 ALT _4 ALKPHOS 69 62 68  BILITOT 0.4 0.7 0.6   Hematology Recent Labs  Lab 03/23/21 1301 03/26/21 0402 03/27/21 0351  WBC 8.6 14.6* 18.0*  RBC 5.05 4.33 4.28  HGB 9.3* 8.1* 8.0*  HCT 32.1* 27.3* 27.2*  MCV 63.6* 63.0* 63.6*  MCH 18.4* 18.7* 18.7*   MCHC 29.0* 29.7* 29.4*  RDW 18.7* 18.1* 18.5*  PLT 368 349 361   BNP Recent Labs  Lab 03/20/21 1220 03/26/21 1457  BNP  --  152.5*  PROBNP 57.0  --     DDimer  Recent Labs  Lab 03/20/21 1220 03/26/21 1433 03/27/21 0351  DDIMER 0.55* 2.23* 2.65*     Radiology/Studies:  CT Abdomen Pelvis Wo Contrast  Result Date: 03/23/2021 CLINICAL DATA:  Cough, nausea for 3 weeks.  Abdomen pain and fever. EXAM: CT ABDOMEN AND PELVIS WITHOUT CONTRAST TECHNIQUE: Multidetector CT imaging of the abdomen and pelvis was performed following the standard protocol without IV contrast. COMPARISON:  None. FINDINGS: Lower chest: No acute abnormality. Dense calcification of mitral valvular ring is identified. Hepatobiliary: There is mild diffuse low density of the liver. No focal lesion is identified in the liver. The gallbladder and biliary tree are normal. Pancreas: Unremarkable. No pancreatic ductal dilatation or surrounding inflammatory changes. Spleen: Normal in size without focal abnormality. Adrenals/Urinary Tract: Adrenal glands are unremarkable. Kidneys are normal, without renal calculi, focal lesion, or hydronephrosis. Bladder is unremarkable. Stomach/Bowel: Stomach is within normal limits. The appendix is not definitely seen but no inflammation is noted around cecum. No evidence of bowel wall thickening, distention, or inflammatory changes. There is diverticulosis of colon. Vascular/Lymphatic: Aortic atherosclerosis. No enlarged abdominal or pelvic lymph nodes. Reproductive: Status post hysterectomy. No adnexal masses. Other: Small umbilical herniation of mesenteric fat is noted. Musculoskeletal: Degenerative joint changes of the spine are identified. IMPRESSION: 1. No acute abnormality identified in the abdomen and pelvis. 2. Fatty infiltration of liver. 3. Diverticulosis of colon without diverticulitis. 4. Aortic atherosclerosis. Aortic Atherosclerosis (ICD10-I70.0). Electronically Signed   By: Abelardo Diesel M.D.   On: 03/23/2021 18:47   DG Chest 2 View  Result Date: 03/25/2021 CLINICAL DATA:  Fever and cough. EXAM: CHEST - 2 VIEW COMPARISON:  Chest x-ray 03/23/2021 and chest CT 03/21/2021 FINDINGS: Borderline heart size is stable. The mediastinal and hilar contours are stable. Persistent patchy nodular interstitial process in the lungs without discrete focal pneumonia. No pleural effusion or pneumothorax. IMPRESSION: Persistent patchy nodular interstitial process in the lungs. Findings could be due to atypical/viral pneumonia, reactive airways disease, hypersensitivity pneumonitis or cryptogenic organizing pneumonia. Electronically Signed   By: Marijo Sanes M.D.   On: 03/25/2021 14:14   DG Chest 2 View  Result Date: 03/23/2021 CLINICAL DATA:  Cough, shortness of breath for several weeks. EXAM: CHEST - 2 VIEW COMPARISON:  March 17, 2021 FINDINGS: The heart size and mediastinal contours are stable. Mitral annular calcification is unchanged. Both lungs are clear. The visualized skeletal structures are unremarkable. IMPRESSION: No active cardiopulmonary disease. Electronically Signed   By: Abelardo Diesel M.D.   On: 03/23/2021 14:16   MR  BRAIN W WO CONTRAST  Result Date: 03/26/2021 CLINICAL DATA:  Respiratory failure, possible endocarditis EXAM: MRI HEAD WITHOUT AND WITH CONTRAST TECHNIQUE: Multiplanar, multiecho pulse sequences of the brain and surrounding structures were obtained without and with intravenous contrast. CONTRAST:  60m GADAVIST GADOBUTROL 1 MMOL/ML IV SOLN COMPARISON:  Correlation made with CT from 2018 FINDINGS: Brain: There is no acute infarction or intracranial hemorrhage. There is no intracranial mass, mass effect, or edema. Chronic left occipital volume loss with corresponding susceptibility reflecting mineralization noted on prior CT. Cortical enhancement in this region is noted along with increased cortical veins. There is relative prominence of the left basal vein and internal cerebral  vein. There is no additional abnormal enhancement. There is no hydrocephalus or extra-axial fluid collection. Ventricles and sulci are normal in size and configuration. Vascular: Major vessel flow voids at the skull base are preserved. Skull and upper cervical spine: Normal marrow signal is preserved. Sinuses/Orbits: Paranasal sinuses are aerated. Orbits are unremarkable. Other: Sella is partially empty. Minor patchy mastoid fluid opacification. IMPRESSION: No evidence of septic emboli are other acute abnormality. Chronic left parietal volume loss with gyral calcification. Increased cortical veins in this region and relative prominence of left deep cerebral veins raise possibility of a dural arteriovenous fistula. Electronically Signed   By: PMacy MisM.D.   On: 03/26/2021 18:21   DG CHEST PORT 1 VIEW  Result Date: 03/26/2021 CLINICAL DATA:  Increasing shortness of breath. EXAM: PORTABLE CHEST 1 VIEW COMPARISON:  03/25/2021, 03/23/2021 and CT 03/21/2021 FINDINGS: Lungs are adequately inflated with persistent hazy bilateral interstitium which may be due to mild interstitial edema versus atypical infection. No effusion. Cardiomediastinal silhouette is unchanged. Focal calcification over the mitral valve annulus unchanged. Remainder of the exam is unchanged. IMPRESSION: Persistent hazy bilateral interstitium which may be due to interstitial edema versus atypical infection. Electronically Signed   By: DMarin OlpM.D.   On: 03/26/2021 14:46   ECHOCARDIOGRAM COMPLETE  Result Date: 03/24/2021    ECHOCARDIOGRAM REPORT   Patient Name:   PNOHEMY KOOPDate of Exam: 03/24/2021 Medical Rec #:  [id number]              Height:       64.0 in Accession #:    [id number]             Weight:       233.0 lb Date of Birth:  405/21/1957               BSA:          2.088 m Patient Age:    89years                BP:           155/70 mmHg Patient Gender: F                       HR:           80 bpm. Exam Location:   Inpatient Procedure: 2D Echo, Cardiac Doppler and Color Doppler Indications:    Fever R50.9  History:        Patient has prior history of Echocardiogram examinations, most                 recent 02/09/2020. CAD and Angina, Signs/Symptoms:Murmur and                 Syncope; Risk Factors:Hypertension, Diabetes, Dyslipidemia and  Sleep Apnea.  Sonographer:    Jonelle Sidle Dance Referring Phys: [id number] Rhetta Mura  Sonographer Comments: Reading Cardiologist notified. IMPRESSIONS  1. Left ventricular ejection fraction, by estimation, is 60 to 65%. The left ventricle has normal function. The left ventricle has no regional wall motion abnormalities. There is moderate left ventricular hypertrophy of the basal-septal segment. Left ventricular diastolic parameters are consistent with Grade II diastolic dysfunction (pseudonormalization). Elevated left ventricular end-diastolic pressure.  2. Right ventricular systolic function is normal. The right ventricular size is normal. There is mildly elevated pulmonary artery systolic pressure. The estimated right ventricular systolic pressure is 48.8 mmHg.  3. Left atrial size was mildly dilated.  4. The mitral valve is abnormal. There is an ill defined density measuring 3.23 x 2.10cm that appears to originate off the posterior MV leaflet but cannot be definite on this. There is also mitral annular calcification present. There is moderate mitral stenosis with a mean MVG of 16mHg. No evidence of mitral valve regurgitation.  5. The aortic valve is normal in structure. Aortic valve regurgitation is not visualized. No aortic stenosis is present.  6. The inferior vena cava is normal in size with greater than 50% respiratory variability, suggesting right atrial pressure of 3 mmHg.  7. Recommend TEE for possible mitral valve endocarditis in setting of abnormal mitral valve and fevers. FINDINGS  Left Ventricle: Left ventricular ejection fraction, by estimation, is 60 to 65%. The  left ventricle has normal function. The left ventricle has no regional wall motion abnormalities. The left ventricular internal cavity size was normal in size. There is  moderate left ventricular hypertrophy of the basal-septal segment. Left ventricular diastolic parameters are consistent with Grade II diastolic dysfunction (pseudonormalization). Elevated left ventricular end-diastolic pressure. Right Ventricle: The right ventricular size is normal. No increase in right ventricular wall thickness. Right ventricular systolic function is normal. There is mildly elevated pulmonary artery systolic pressure. The tricuspid regurgitant velocity is 2.93  m/s, and with an assumed right atrial pressure of 3 mmHg, the estimated right ventricular systolic pressure is 389.1mmHg. Left Atrium: Left atrial size was mildly dilated. Right Atrium: Right atrial size was normal in size. Pericardium: There is no evidence of pericardial effusion. Mitral Valve: There is an ill defined density measuring 3.23 x 2.10cm that appears to originate off the posterior MV leaflet but cannot be definite on this. There is also mitral annular calcification present. There is moderate mitral stenosis with a mean  MVG of 92mg. The mitral valve is abnormal. Severe mitral annular calcification. No evidence of mitral valve regurgitation. Moderate mitral valve stenosis. MV peak gradient, 18.8 mmHg. The mean mitral valve gradient is 9.0 mmHg. Tricuspid Valve: The tricuspid valve is normal in structure. Tricuspid valve regurgitation is mild . No evidence of tricuspid stenosis. Aortic Valve: The aortic valve is normal in structure. Aortic valve regurgitation is not visualized. No aortic stenosis is present. Pulmonic Valve: The pulmonic valve was normal in structure. Pulmonic valve regurgitation is not visualized. No evidence of pulmonic stenosis. Aorta: The aortic root is normal in size and structure. Venous: The inferior vena cava is normal in size with  greater than 50% respiratory variability, suggesting right atrial pressure of 3 mmHg. IAS/Shunts: No atrial level shunt detected by color flow Doppler.  LEFT VENTRICLE PLAX 2D LVIDd:         3.00 cm  Diastology LVIDs:         1.70 cm  LV e' medial:    6.42 cm/s LV  PW:         2.10 cm  LV E/e' medial:  25.9 LV IVS:        1.40 cm  LV e' lateral:   7.51 cm/s LVOT diam:     1.90 cm  LV E/e' lateral: 22.1 LV SV:         59 LV SV Index:   28 LVOT Area:     2.84 cm  RIGHT VENTRICLE             IVC RV Basal diam:  2.80 cm     IVC diam: 1.70 cm RV S prime:     19.60 cm/s TAPSE (M-mode): 2.1 cm LEFT ATRIUM             Index       RIGHT ATRIUM          Index LA diam:        4.20 cm 2.01 cm/m  RA Area:     9.78 cm LA Vol (A2C):   82.4 ml 39.47 ml/m RA Volume:   20.10 ml 9.63 ml/m LA Vol (A4C):   71.0 ml 34.01 ml/m LA Biplane Vol: 76.5 ml 36.65 ml/m  AORTIC VALVE LVOT Vmax:   116.00 cm/s LVOT Vmean:  75.000 cm/s LVOT VTI:    0.207 m  AORTA Ao Root diam: 3.20 cm Ao Asc diam:  3.10 cm MITRAL VALVE                TRICUSPID VALVE MV Area (PHT): 2.76 cm     TR Peak grad:   34.3 mmHg MV Area VTI:   0.89 cm     TR Vmax:        293.00 cm/s MV Peak grad:  18.8 mmHg MV Mean grad:  9.0 mmHg     SHUNTS MV Vmax:       2.17 m/s     Systemic VTI:  0.21 m MV Vmean:      147.0 cm/s   Systemic Diam: 1.90 cm MV Decel Time: 275 msec MV E velocity: 166.00 cm/s MV A velocity: 195.00 cm/s MV E/A ratio:  0.85 Fransico Him MD Electronically signed by Fransico Him MD Signature Date/Time: 03/24/2021/12:59:52 PM    Final      Assessment and Plan:   1. Fever of unknown origin with acute respiratory failure and interstitial changes on lung imaging - 2D echo raised question of possible mitral valve endocarditis, described in an area previously known to have opacification by fluoroscopy in 2018 and prior echocardiogram - will discuss with our scheduling team about feasibility of scheduling then reach out to PCCM to make sure this works with  their schedule  Shared Decision Making/Informed Consent The risks [esophageal damage, perforation (1:10,000 risk), bleeding, pharyngeal hematoma as well as other potential complications associated with conscious sedation including aspiration, arrhythmia, respiratory failure and death], benefits (treatment guidance and diagnostic support) and alternatives of a transesophageal echocardiogram were discussed in detail with Lynn [name] and she is willing to proceed.    2. Newly recognized atrial fibrillation - 3 hours on 03/26/21 - not on [place] prior to admission - CHADSVASc 5 but episode was very brief (not on ASA PTA - noet hx of stomach ulcers remotely, also question of dural AVF on MRI) - TSH wnl - continue diltiazem - will d/w MD  3. H/o nonobstructive CAD/coronary vasospasm - recent OP troponin negative - long acting diltiazem was changed to short acting on arrival, consider consolidating back - previously  on Imdur, listed as not taking PTA  4. History of diastolic dysfunction - not on diuretic PTA, no history of clinical heart failure by record - BNP generally low this admission - will discuss trial of diuretic with MD  5. Moderate mitral stenosis - continue to follow as OP, eval by TEE  Other medical issues include - Microcytic anemia with h/o thalassemia - Suspected CKD stage IIIa by historical creatinine - Abnormal brain MRI raising question of dural AV fistula - DM with elevated blood sugars  Risk Assessment/Risk Scores:     HEAR Score (for undifferentiated chest pain):  HEAR Score: 5  CHA2DS2-VASc Score = 5  This indicates a 7.2% annual risk of stroke. The patient's score is based upon: CHF History: No HTN History: Yes Diabetes History: Yes Stroke History: No Vascular Disease History: Yes Age Score: 1 Gender Score: 1        For questions or updates, please contact [place] Please consult [email or web address] for contact info under    Signed, Charlie Pitter,  PA-C  03/27/2021 9:01 AM  Personally seen and examined. Agree with above.  65 year old being seen for possible endocarditis.  Also found to have atrial fibrillation for about 3 hours which was new.  Has known coronary disease followed by Dr. Ellyn Hack.  Had fever over 3 weeks.  ESR 68.  CRP 9.5.  An echocardiogram performed showed normal EF with mitral valve, ill-defined density measuring 3 x 2 cm that appears to originate off the posterior mitral valve leaflet.  MAC also present with moderate mitral stenosis.  On telemetry atrial fibrillation was noted for about 3 hours before converting back to sinus rhythm on 03/26/2021.  No significant change in heart rate during episode.  GEN: Well nourished, well developed, in no acute distress HEENT: normal Neck: no JVD, carotid bruits, or masses Cardiac: RRR; no murmurs, rubs, or gallops,no edema  Respiratory:  clear to auscultation bilaterally, normal work of breathing GI: soft, nontender, nondistended, + BS MS: no deformity or atrophy Skin: warm and dry, no rash Neuro:  Alert and Oriented x 3, Strength and sensation are intact Psych: euthymic mood, full affect  Assessment and plan:  Fever, questionable mitral valve mass - Transesophageal echocardiogram has been ordered.  Awaiting timing. -They are going to try to have this performed with bronchoscopy  Paroxysmal atrial fibrillation - Only approximately 3 hours noted on 03/26/2021.  Not on anticoagulation.  Given her current underlying condition, we will hold off on anticoagulation at this time unless atrial fibrillation returns for a noticeable period of time. -There is question of dural AV fistula on MRI as well and remote history of stomach ulcers noted.  We will try to hold off on anticoagulation if possible. -TSH is normal.  On diltiazem.  No changes made.  Moderate mitral stenosis with MAC - Awaiting TEE.  Coronary vasospasm - Stable.  Candee Furbish, MD

## 2021-03-27 NOTE — H&P (View-Only) (Signed)
[facility] for Infectious Disease    Date of Admission:  03/23/2021   Total days of antibiotics 4           ID: Lynn [name] is a 65 y.o. female with   Principal Problem:   Acute respiratory failure with hypoxia ([place]) Active Problems:   Essential hypertension   Chronic diastolic CHF (congestive heart failure) (HCC)   Type 2 diabetes mellitus with hyperglycemia, with long-term current use of insulin (H[facility])   Acute bronchitis   SOB (shortness of breath)   FUO (fever of unknown origin)   Hypokalemia   Generalized weakness   Acute bacterial endocarditis    Subjective: Afebrile in the last 24hr but also started on steroids.feeling better since not having fevers. Still requiring up to 7L [place] She underwent TTE over the weekend where she was found to have large 3.23 x 2.1cm ill defined mass off of posterior MV leaflet (though it was noted in 2021). Also was seen by cardiology and pulm consultants to arrange for TEE and bronch  Blood cx remain negative WBC increased due to steroids  Medications:   arformoterol  15 mcg Nebulization BID   atorvastatin  80 mg Oral q1800   azithromycin  500 mg Oral Daily   diltiazem  60 mg Oral Q8H   fluticasone  1 spray Each Nare Daily   insulin aspart  0-20 Units Subcutaneous TID WC   insulin aspart  0-5 Units Subcutaneous QHS   insulin aspart  6 Units Subcutaneous TID WC   insulin glargine  25 Units Subcutaneous QHS   loratadine  10 mg Oral Daily   losartan  100 mg Oral Daily   [START ON 03/28/2021] methylPREDNISolone (SOLU-MEDROL) injection  40 mg Intravenous Q24H   multivitamin with minerals  1 tablet Oral Daily   revefenacin  175 mcg Nebulization Daily   saccharomyces boulardii  250 mg Oral BID    Objective: Vital signs in last 24 hours: Temp:  [97.6 F (36.4 C)-97.9 F (36.6 C)] 97.6 F (36.4 C) (06/13 1318) Pulse Rate:  [65-82] 74 (06/13 1318) Resp:  [16-20] 16 (06/13 1318) BP: (122-138)/(57-72) 128/57 (06/13  1318) SpO2:  [88 %-98 %] 98 % (06/13 1318) FiO2 (%):  [94 %] 94 % (06/13 0850) Weight:  [106.2 kg] 106.2 kg (06/13 0353) Physical Exam  Constitutional:  oriented to person, place, and time. appears well-developed and well-nourished. No distress.  HENT: /AT, PERRLA, no scleral icterus Mouth/Throat: Oropharynx is clear and moist. No oropharyngeal exudate.  Cardiovascular: Normal rate, regular rhythm and normal heart sounds. Exam reveals no gallop and no friction rub.  No murmur heard.  Pulmonary/Chest: Effort normal and breath sounds normal. No respiratory distress.  has no wheezes.  Neck = supple, no nuchal rigidity Abdominal: Soft. Bowel sounds are normal.  exhibits no distension. There is no tenderness.  Lymphadenopathy: no cervical adenopathy. No axillary adenopathy Neurological: alert and oriented to person, place, and time.  Skin: Skin is warm and dry. No rash noted. No erythema.  Psychiatric: a normal mood and affect.  behavior is normal.    Lab Results Recent Labs    03/26/21 0402 03/26/21 2303 03/27/21 0351 03/27/21 1132  WBC 14.6*  --  18.0*  --   HGB 8.1*  --  8.0*  --   HCT 27.3*  --  27.2*  --   NA 132*   < > 133* 133*  K 4.0   < > 4.7 4.3  CL 100   < >  99 97*  CO2 24   < > 27 25  BUN 15   < > 19 22  CREATININE 1.13*   < > 1.23* 1.11*   < > = values in this interval not displayed.   Liver Panel Recent Labs    03/26/21 0402 03/27/21 0351  PROT 7.1 7.5  ALBUMIN 3.2* 3.1*  AST 18 18  ALT 18 18  ALKPHOS 62 68  BILITOT 0.7 0.6   Sedimentation Rate Recent Labs    03/26/21 0402  ESRSEDRATE 68*   C-Reactive Protein Recent Labs    03/26/21 0357  CRP 9.5*    Microbiology: Blood cx ngtd Studies/Results: MR BRAIN W WO CONTRAST  Result Date: 03/26/2021 CLINICAL DATA:  Respiratory failure, possible endocarditis EXAM: MRI HEAD WITHOUT AND WITH CONTRAST TECHNIQUE: Multiplanar, multiecho pulse sequences of the brain and surrounding structures were  obtained without and with intravenous contrast. CONTRAST:  36mL GADAVIST GADOBUTROL 1 MMOL/ML IV SOLN COMPARISON:  Correlation made with CT from 2018 FINDINGS: Brain: There is no acute infarction or intracranial hemorrhage. There is no intracranial mass, mass effect, or edema. Chronic left occipital volume loss with corresponding susceptibility reflecting mineralization noted on prior CT. Cortical enhancement in this region is noted along with increased cortical veins. There is relative prominence of the left basal vein and internal cerebral vein. There is no additional abnormal enhancement. There is no hydrocephalus or extra-axial fluid collection. Ventricles and sulci are normal in size and configuration. Vascular: Major vessel flow voids at the skull base are preserved. Skull and upper cervical spine: Normal marrow signal is preserved. Sinuses/Orbits: Paranasal sinuses are aerated. Orbits are unremarkable. Other: Sella is partially empty. Minor patchy mastoid fluid opacification. IMPRESSION: No evidence of septic emboli are other acute abnormality. Chronic left parietal volume loss with gyral calcification. Increased cortical veins in this region and relative prominence of left deep cerebral veins raise possibility of a dural arteriovenous fistula. Electronically Signed   By: Macy Mis M.D.   On: 03/26/2021 18:21   DG CHEST PORT 1 VIEW  Result Date: 03/26/2021 CLINICAL DATA:  Increasing shortness of breath. EXAM: PORTABLE CHEST 1 VIEW COMPARISON:  03/25/2021, 03/23/2021 and CT 03/21/2021 FINDINGS: Lungs are adequately inflated with persistent hazy bilateral interstitium which may be due to mild interstitial edema versus atypical infection. No effusion. Cardiomediastinal silhouette is unchanged. Focal calcification over the mitral valve annulus unchanged. Remainder of the exam is unchanged. IMPRESSION: Persistent hazy bilateral interstitium which may be due to interstitial edema versus atypical infection.  Electronically Signed   By: Marin Olp M.D.   On: 03/26/2021 14:46     Assessment/Plan: 65yo F with FUO with worsening pulmonary symptoms and hypoxia found to have MV mass, but calcified mass has been noted in 2021-- ? Endocarditis   Suspect 1-2 different processes occurring. Still concern with pulmonary process for atypical infection causing hypoxia. Appreciate PCCM arranging bronchoscopy to send specimen for culture. For the time being, can continue with azithro,vancomycin and ceftriaxone  MV mass, previously described as calcified = agree TEE would be useful to characterize lesion and how it compares to previous TTE. Empircally started on vancomycin and ceftriaxone for culture negative endocarditis. Will send labs off of CNE work up.  Fevers = eliminated however she was just started on steroids and would explain leukocytosis      [facility] Cell: [phone] Pager: [phone]  03/27/2021, 4:26 PM

## 2021-03-27 NOTE — Progress Notes (Signed)
[facility] for Infectious Disease    Date of Admission:  03/23/2021   Total days of antibiotics 4           ID: Lynn [name] is a 65 y.o. female with   Principal Problem:   Acute respiratory failure with hypoxia ([place]) Active Problems:   Essential hypertension   Chronic diastolic CHF (congestive heart failure) (HCC)   Type 2 diabetes mellitus with hyperglycemia, with long-term current use of insulin (HCC)   Acute bronchitis   SOB (shortness of breath)   FUO (fever of unknown origin)   Hypokalemia   Generalized weakness   Acute bacterial endocarditis    Subjective: Afebrile in the last 24hr but also started on steroids.feeling better since not having fevers. Still requiring up to 7L [place] She underwent TTE over the weekend where she was found to have large 3.23 x 2.1cm ill defined mass off of posterior MV leaflet (though it was noted in 2021). Also was seen by cardiology and pulm consultants to arrange for TEE and bronch  Blood cx remain negative WBC increased due to steroids  Medications:   arformoterol  15 mcg Nebulization BID   atorvastatin  80 mg Oral q1800   azithromycin  500 mg Oral Daily   diltiazem  60 mg Oral Q8H   fluticasone  1 spray Each Nare Daily   insulin aspart  0-20 Units Subcutaneous TID WC   insulin aspart  0-5 Units Subcutaneous QHS   insulin aspart  6 Units Subcutaneous TID WC   insulin glargine  25 Units Subcutaneous QHS   loratadine  10 mg Oral Daily   losartan  100 mg Oral Daily   [START ON 03/28/2021] methylPREDNISolone (SOLU-MEDROL) injection  40 mg Intravenous Q24H   multivitamin with minerals  1 tablet Oral Daily   revefenacin  175 mcg Nebulization Daily   saccharomyces boulardii  250 mg Oral BID    Objective: Vital signs in last 24 hours: Temp:  [97.6 F (36.4 C)-97.9 F (36.6 C)] 97.6 F (36.4 C) (06/13 1318) Pulse Rate:  [65-82] 74 (06/13 1318) Resp:  [16-20] 16 (06/13 1318) BP: (122-138)/(57-72) 128/57 (06/13  1318) SpO2:  [88 %-98 %] 98 % (06/13 1318) FiO2 (%):  [94 %] 94 % (06/13 0850) Weight:  [106.2 kg] 106.2 kg (06/13 0353) Physical Exam  Constitutional:  oriented to person, place, and time. appears well-developed and well-nourished. No distress.  HENT: [place]/AT, PERRLA, no scleral icterus Mouth/Throat: Oropharynx is clear and moist. No oropharyngeal exudate.  Cardiovascular: Normal rate, regular rhythm and normal heart sounds. Exam reveals no gallop and no friction rub.  No murmur heard.  Pulmonary/Chest: Effort normal and breath sounds normal. No respiratory distress.  has no wheezes.  Neck = supple, no nuchal rigidity Abdominal: Soft. Bowel sounds are normal.  exhibits no distension. There is no tenderness.  Lymphadenopathy: no cervical adenopathy. No axillary adenopathy Neurological: alert and oriented to person, place, and time.  Skin: Skin is warm and dry. No rash noted. No erythema.  Psychiatric: a normal mood and affect.  behavior is normal.    Lab Results Recent Labs    03/26/21 0402 03/26/21 2303 03/27/21 0351 03/27/21 1132  WBC 14.6*  --  18.0*  --   HGB 8.1*  --  8.0*  --   HCT 27.3*  --  27.2*  --   NA 132*   < > 133* 133*  K 4.0   < > 4.7 4.3  CL 100   < >  99 97*  CO2 24   < > 27 25  BUN 15   < > 19 22  CREATININE 1.13*   < > 1.23* 1.11*   < > = values in this interval not displayed.   Liver Panel Recent Labs    03/26/21 0402 03/27/21 0351  PROT 7.1 7.5  ALBUMIN 3.2* 3.1*  AST 18 18  ALT 18 18  ALKPHOS 62 68  BILITOT 0.7 0.6   Sedimentation Rate Recent Labs    03/26/21 0402  ESRSEDRATE 68*   C-Reactive Protein Recent Labs    03/26/21 0357  CRP 9.5*    Microbiology: Blood cx ngtd Studies/Results: MR BRAIN W WO CONTRAST  Result Date: 03/26/2021 CLINICAL DATA:  Respiratory failure, possible endocarditis EXAM: MRI HEAD WITHOUT AND WITH CONTRAST TECHNIQUE: Multiplanar, multiecho pulse sequences of the brain and surrounding structures were  obtained without and with intravenous contrast. CONTRAST:  76mL GADAVIST GADOBUTROL 1 MMOL/ML IV SOLN COMPARISON:  Correlation made with CT from 2018 FINDINGS: Brain: There is no acute infarction or intracranial hemorrhage. There is no intracranial mass, mass effect, or edema. Chronic left occipital volume loss with corresponding susceptibility reflecting mineralization noted on prior CT. Cortical enhancement in this region is noted along with increased cortical veins. There is relative prominence of the left basal vein and internal cerebral vein. There is no additional abnormal enhancement. There is no hydrocephalus or extra-axial fluid collection. Ventricles and sulci are normal in size and configuration. Vascular: Major vessel flow voids at the skull base are preserved. Skull and upper cervical spine: Normal marrow signal is preserved. Sinuses/Orbits: Paranasal sinuses are aerated. Orbits are unremarkable. Other: Sella is partially empty. Minor patchy mastoid fluid opacification. IMPRESSION: No evidence of septic emboli are other acute abnormality. Chronic left parietal volume loss with gyral calcification. Increased cortical veins in this region and relative prominence of left deep cerebral veins raise possibility of a dural arteriovenous fistula. Electronically Signed   By: Macy Mis M.D.   On: 03/26/2021 18:21   DG CHEST PORT 1 VIEW  Result Date: 03/26/2021 CLINICAL DATA:  Increasing shortness of breath. EXAM: PORTABLE CHEST 1 VIEW COMPARISON:  03/25/2021, 03/23/2021 and CT 03/21/2021 FINDINGS: Lungs are adequately inflated with persistent hazy bilateral interstitium which may be due to mild interstitial edema versus atypical infection. No effusion. Cardiomediastinal silhouette is unchanged. Focal calcification over the mitral valve annulus unchanged. Remainder of the exam is unchanged. IMPRESSION: Persistent hazy bilateral interstitium which may be due to interstitial edema versus atypical infection.  Electronically Signed   By: Marin Olp M.D.   On: 03/26/2021 14:46     Assessment/Plan: 65yo F with FUO with worsening pulmonary symptoms and hypoxia found to have MV mass, but calcified mass has been noted in 2021-- ? Endocarditis   Suspect 1-2 different processes occurring. Still concern with pulmonary process for atypical infection causing hypoxia. Appreciate PCCM arranging bronchoscopy to send specimen for culture. For the time being, can continue with azithro,vancomycin and ceftriaxone  MV mass, previously described as calcified = agree TEE would be useful to characterize lesion and how it compares to previous TTE. Empircally started on vancomycin and ceftriaxone for culture negative endocarditis. Will send labs off of CNE work up.  Fevers = eliminated however she was just started on steroids and would explain leukocytosis      [facility] Cell: [phone] Pager: [phone]  03/27/2021, 4:26 PM

## 2021-03-28 ENCOUNTER — Inpatient Hospital Stay (HOSPITAL_COMMUNITY): Payer: Medicare Other | Admitting: Anesthesiology

## 2021-03-28 ENCOUNTER — Encounter (HOSPITAL_COMMUNITY)
Admission: EM | Disposition: A | Discharge status: Home / Self Care | Payer: Self-pay | Source: Home / Self Care | Attending: Internal Medicine

## 2021-03-28 ENCOUNTER — Inpatient Hospital Stay (HOSPITAL_COMMUNITY): Payer: Medicare Other

## 2021-03-28 ENCOUNTER — Encounter: Payer: Self-pay | Admitting: Family Medicine

## 2021-03-28 DIAGNOSIS — I342 Nonrheumatic mitral (valve) stenosis: Secondary | ICD-10-CM

## 2021-03-28 DIAGNOSIS — I5032 Chronic diastolic (congestive) heart failure: Secondary | ICD-10-CM | POA: Diagnosis not present

## 2021-03-28 DIAGNOSIS — D849 Immunodeficiency, unspecified: Secondary | ICD-10-CM

## 2021-03-28 HISTORY — PX: VIDEO BRONCHOSCOPY: SHX5072

## 2021-03-28 HISTORY — PX: TEE WITHOUT CARDIOVERSION: SHX5443

## 2021-03-28 HISTORY — PX: BRONCHIAL WASHINGS: SHX5105

## 2021-03-28 LAB — GLUCOSE, CAPILLARY
Glucose-Capillary: 253 mg/dL — ABNORMAL HIGH (ref 70–99)
Glucose-Capillary: 318 mg/dL — ABNORMAL HIGH (ref 70–99)
Glucose-Capillary: 326 mg/dL — ABNORMAL HIGH (ref 70–99)
Glucose-Capillary: 335 mg/dL — ABNORMAL HIGH (ref 70–99)
Glucose-Capillary: 346 mg/dL — ABNORMAL HIGH (ref 70–99)
Glucose-Capillary: 369 mg/dL — ABNORMAL HIGH (ref 70–99)

## 2021-03-28 LAB — CBC
HCT: 27.6 % — ABNORMAL LOW (ref 36.0–46.0)
Hemoglobin: 8.1 g/dL — ABNORMAL LOW (ref 12.0–15.0)
MCH: 18.7 pg — ABNORMAL LOW (ref 26.0–34.0)
MCHC: 29.3 g/dL — ABNORMAL LOW (ref 30.0–36.0)
MCV: 63.7 fL — ABNORMAL LOW (ref 80.0–100.0)
Platelets: 412 10*3/uL — ABNORMAL HIGH (ref 150–400)
RBC: 4.33 MIL/uL (ref 3.87–5.11)
RDW: 18.6 % — ABNORMAL HIGH (ref 11.5–15.5)
WBC: 30.2 10*3/uL — ABNORMAL HIGH (ref 4.0–10.5)
nRBC: 0.2 % (ref 0.0–0.2)

## 2021-03-28 LAB — PROTEIN ELECTROPHORESIS, SERUM
A/G Ratio: 1.1 (ref 0.7–1.7)
Albumin ELP: 3.4 g/dL (ref 2.9–4.4)
Alpha-1-Globulin: 0.3 g/dL (ref 0.0–0.4)
Alpha-2-Globulin: 0.8 g/dL (ref 0.4–1.0)
Beta Globulin: 1 g/dL (ref 0.7–1.3)
Gamma Globulin: 0.9 g/dL (ref 0.4–1.8)
Globulin, Total: 3 g/dL (ref 2.2–3.9)
Total Protein ELP: 6.4 g/dL (ref 6.0–8.5)

## 2021-03-28 LAB — EPSTEIN-BARR VIRUS (EBV) ANTIBODY PROFILE
EBV NA IgG: 243 U/mL — ABNORMAL HIGH (ref 0.0–17.9)
EBV VCA IgG: >600 U/mL — ABNORMAL HIGH (ref 0.0–17.9)
EBV VCA IgM: <36 U/mL (ref 0.0–35.9)

## 2021-03-28 LAB — COMPREHENSIVE METABOLIC PANEL
ALT: 22 U/L (ref 0–44)
AST: 18 U/L (ref 15–41)
Albumin: 3.3 g/dL — ABNORMAL LOW (ref 3.5–5.0)
Alkaline Phosphatase: 84 U/L (ref 38–126)
Anion gap: 6 (ref 5–15)
BUN: 25 mg/dL — ABNORMAL HIGH (ref 8–23)
CO2: 28 mmol/L (ref 22–32)
Calcium: 9.3 mg/dL (ref 8.9–10.3)
Chloride: 99 mmol/L (ref 98–111)
Creatinine, Ser: 1.16 mg/dL — ABNORMAL HIGH (ref 0.44–1.00)
GFR, Estimated: 52 mL/min — ABNORMAL LOW (ref >60)
Glucose, Bld: 379 mg/dL — ABNORMAL HIGH (ref 70–99)
Potassium: 4.9 mmol/L (ref 3.5–5.1)
Sodium: 133 mmol/L — ABNORMAL LOW (ref 135–145)
Total Bilirubin: 0.2 mg/dL — ABNORMAL LOW (ref 0.3–1.2)
Total Protein: 7.6 g/dL (ref 6.5–8.1)

## 2021-03-28 LAB — LEGIONELLA PNEUMOPHILA SEROGP 1 UR AG: L. pneumophila Serogp 1 Ur Ag: NEGATIVE

## 2021-03-28 LAB — BODY FLUID CELL COUNT WITH DIFFERENTIAL
Eos, Fluid: 1 %
Lymphs, Fluid: 64 %
Monocyte-Macrophage-Serous Fluid: 32 % — ABNORMAL LOW (ref 50–90)
Neutrophil Count, Fluid: 3 % (ref 0–25)
Total Nucleated Cell Count, Fluid: 91 cu mm (ref 0–1000)

## 2021-03-28 LAB — RHEUMATOID FACTOR: Rheumatoid fact SerPl-aCnc: 21.8 IU/mL — ABNORMAL HIGH (ref <14.0)

## 2021-03-28 SURGERY — ECHOCARDIOGRAM, TRANSESOPHAGEAL
Anesthesia: General

## 2021-03-28 MED ORDER — LIDOCAINE 2% (20 MG/ML) 5 ML SYRINGE
INTRAMUSCULAR | Status: DC | PRN
  Administered 2021-03-28: 60 mg via INTRAVENOUS

## 2021-03-28 MED ORDER — SODIUM CHLORIDE 0.9 % IV SOLN: INTRAVENOUS | Status: AC

## 2021-03-28 MED ORDER — ONDANSETRON HCL 4 MG/2ML IJ SOLN
INTRAMUSCULAR | Status: DC | PRN
  Administered 2021-03-28: 4 mg via INTRAVENOUS

## 2021-03-28 MED ORDER — ROCURONIUM BROMIDE 10 MG/ML (PF) SYRINGE
PREFILLED_SYRINGE | INTRAVENOUS | Status: DC | PRN
  Administered 2021-03-28: 100 mg via INTRAVENOUS

## 2021-03-28 MED ORDER — PHENYLEPHRINE 40 MCG/ML (10ML) SYRINGE FOR IV PUSH (FOR BLOOD PRESSURE SUPPORT)
PREFILLED_SYRINGE | INTRAVENOUS | Status: DC | PRN
  Administered 2021-03-28: 80 ug via INTRAVENOUS

## 2021-03-28 MED ORDER — SUGAMMADEX SODIUM 200 MG/2ML IV SOLN
INTRAVENOUS | Status: DC | PRN
  Administered 2021-03-28: 400 mg via INTRAVENOUS

## 2021-03-28 MED ORDER — INSULIN GLARGINE 100 UNIT/ML ~~LOC~~ SOLN
35.0000 [IU] | Freq: Every day | SUBCUTANEOUS | Status: DC
  Administered 2021-03-28: 35 [IU] via SUBCUTANEOUS
  Filled 2021-03-28 (×2): qty 0.35

## 2021-03-28 MED ORDER — ALBUTEROL SULFATE HFA 108 (90 BASE) MCG/ACT IN AERS
INHALATION_SPRAY | RESPIRATORY_TRACT | Status: DC | PRN
  Administered 2021-03-28: 6 via RESPIRATORY_TRACT

## 2021-03-28 MED ORDER — PROPOFOL 10 MG/ML IV BOLUS
INTRAVENOUS | Status: DC | PRN
  Administered 2021-03-28: 130 mg via INTRAVENOUS

## 2021-03-28 MED ORDER — FENTANYL CITRATE (PF) 100 MCG/2ML IJ SOLN
INTRAMUSCULAR | Status: AC
  Filled 2021-03-28: qty 2

## 2021-03-28 MED ORDER — FENTANYL CITRATE (PF) 250 MCG/5ML IJ SOLN
INTRAMUSCULAR | Status: DC | PRN
  Administered 2021-03-28: 50 ug via INTRAVENOUS

## 2021-03-28 MED ORDER — LACTATED RINGERS IV SOLN: INTRAVENOUS | Status: DC | PRN

## 2021-03-28 MED ORDER — LACTATED RINGERS IV SOLN: INTRAVENOUS | Status: DC

## 2021-03-28 NOTE — Anesthesia Postprocedure Evaluation (Signed)
Anesthesia Post Note  Patient: Io Dieujuste  Procedure(s) Performed: TRANSESOPHAGEAL ECHOCARDIOGRAM (TEE) VIDEO BRONCHOSCOPY WITHOUT FLUORO BRONCHIAL WASHINGS     Patient location during evaluation: PACU Anesthesia Type: General Level of consciousness: awake and alert, oriented and patient cooperative Pain management: pain level controlled Vital Signs Assessment: post-procedure vital signs reviewed and stable Respiratory status: spontaneous breathing, nonlabored ventilation and respiratory function stable Cardiovascular status: blood pressure returned to baseline and stable Postop Assessment: no apparent nausea or vomiting Anesthetic complications: no Comments: Stable on similar oxygen requirements as preop    No notable events documented.  Last Vitals:  Vitals:   03/28/21 1520 03/28/21 1530  BP: (!) 113/47 (!) 128/55  Pulse: (!) 102 94  Resp: 18 20  Temp:    SpO2: 98% 96%    Last Pain:  Vitals:   03/28/21 1530  TempSrc:   PainSc: 0-No pain                 Pervis Hocking

## 2021-03-28 NOTE — Progress Notes (Signed)
Inpatient Diabetes Program Recommendations  AACE/ADA: New Consensus Statement on Inpatient Glycemic Control (2015)  Target Ranges:  Prepandial:   less than 140 mg/dL      Peak postprandial:   less than 180 mg/dL (1-2 hours)      Critically ill patients:  140 - 180 mg/dL   Lab Results  Component Value Date   GLUCAP 318 (H) 03/28/2021   HGBA1C 8.0 (A) 01/30/2021    Review of Glycemic Control Results for DANIJAH, NOH "PAT" (MRN [id number]) as of 03/28/2021 08:53  Ref. Range 03/27/2021 07:33 03/27/2021 11:16 03/27/2021 16:06 03/27/2021 20:30 03/28/2021 00:05 03/28/2021 05:07 03/28/2021 07:53  Glucose-Capillary Latest Ref Range: 70 - 99 mg/dL 379 (H) 451 (H) 313 (H) 337 (H) 335 (H) 346 (H) 318 (H)    Diabetes history:  DM2 Outpatient Diabetes medications:  Omnipod/Dexcon CGM/Humalog Basal-2.5/hr (60 units QD) Insulin:Carb ration-1 unit/12 carbs Target-100-100 mg/dL Insulin sensitivity factor 20 (1 unit brings blood sugar down 20 points) Victoza 1.8 mg QD Current orders for Inpatient glycemic control:  Lantus 25 units QHS Novolog 0-20 units TID/0-5 units QHS Novolog 6 units TID Solumedrol 40 mg Q24H  Inpatient Diabetes Program Recommendations:    MD-Please consider increasing Lantus to 35 units QD   Will continue to follow while inpatient.  Thanks, Tama Headings RN, MSN, BC-ADM Inpatient Diabetes Coordinator Team Pager [phone] (8a-5p)

## 2021-03-28 NOTE — Anesthesia Preprocedure Evaluation (Addendum)
Anesthesia Evaluation  Patient identified by MRN, date of birth, ID band Patient awake    Reviewed: Allergy & Precautions, NPO status , Patient's Chart, lab work & pertinent test results  Airway Mallampati: III  TM Distance: >3 FB Neck ROM: Full    Dental no notable dental hx. (+) Missing, Poor Dentition, Dental Advisory Given,    Pulmonary sleep apnea and Continuous Positive Airway Pressure Ventilation , pneumonia, unresolved, COPD,  COPD inhaler, former smoker,  Quit smoking 2016, 11 pack year history   admitted on 03/23/21 with acute hypoxemic respiratory failure- on 7LPM [place] (HFNC at night) She has been treated with doxycycline and azithromycin for concern of bronchitis.   Pulmonary exam normal breath sounds clear to auscultation       Cardiovascular hypertension, Pt. on medications pulmonary hypertension (mild pHTN)+ CAD (non-occlusive), + Past MI and +CHF (grade 2 diastolic dysfunction)  Normal cardiovascular exam+ Valvular Problems/Murmurs (mod MS)  Rhythm:Regular Rate:Normal  Echo 03/24/21: 1. Left ventricular ejection fraction, by estimation, is 60 to 65%. The  left ventricle has normal function. The left ventricle has no regional  wall motion abnormalities. There is moderate left ventricular hypertrophy  of the basal-septal segment. Left  ventricular diastolic parameters are consistent with Grade II diastolic  dysfunction (pseudonormalization). Elevated left ventricular end-diastolic  pressure.  2. Right ventricular systolic function is normal. The right ventricular  size is normal. There is mildly elevated pulmonary artery systolic  pressure. The estimated right ventricular systolic pressure is 03.5 mmHg.  3. Left atrial size was mildly dilated.  4. The mitral valve is abnormal. There is an ill defined density  measuring 3.23 x 2.10cm that appears to originate off the posterior MV  leaflet but cannot be definite on  this. There is also mitral annular  calcification present. There is moderate mitral  stenosis with a mean MVG of 5mmHg. No evidence of mitral valve  regurgitation.  5. The aortic valve is normal in structure. Aortic valve regurgitation is  not visualized. No aortic stenosis is present.  6. The inferior vena cava is normal in size with greater than 50%  respiratory variability, suggesting right atrial pressure of 3 mmHg.  7. Recommend TEE for possible mitral valve endocarditis in setting of  abnormal mitral valve and fevers.    Neuro/Psych  Headaches, PSYCHIATRIC DISORDERS Anxiety Depression    GI/Hepatic negative GI ROS, Neg liver ROS,   Endo/Other  diabetes, Poorly Controlled, Type 2, Insulin DependentMorbid obesityBMI 41 Last a1c 8.0  Renal/GU Renal InsufficiencyRenal diseaseCr 1.16  negative genitourinary   Musculoskeletal  (+) Arthritis , Osteoarthritis,    Abdominal (+) + obese,   Peds  Hematology  (+) Blood dyscrasia, anemia , Thalassemia  hct 27.6   Anesthesia Other Findings   Reproductive/Obstetrics negative OB ROS                           Anesthesia Physical Anesthesia Plan  ASA: 4  Anesthesia Plan: General   Post-op Pain Management:    Induction: Intravenous  PONV Risk Score and Plan: 3 and Ondansetron and Treatment may vary due to age or medical condition  Airway Management Planned: Oral ETT  Additional Equipment: None  Intra-op Plan:   Post-operative Plan: Extubation in OR and Possible Post-op intubation/ventilation  Informed Consent: I have reviewed the patients History and Physical, chart, labs and discussed the procedure including the risks, benefits and alternatives for the proposed anesthesia with the patient or authorized representative  who has indicated his/her understanding and acceptance.     Dental advisory given  Plan Discussed with: CRNA  Anesthesia Plan Comments: (Already has high oxygen requirements,  possible post-operative intubation)      Anesthesia Quick Evaluation

## 2021-03-28 NOTE — Op Note (Signed)
Video Bronchoscopy Procedure Note  Date of Operation: 03/28/2021  Pre-op Diagnosis: diffuse airspace disease, acute respiratory failure with hypoxia, immunocompromised  Post-op Diagnosis: Same  Surgeon: Julian Hy  Assistants: none  Anesthesia: deep sedation  Meds Given: per anesthesia records  Operation: Flexible video fiberoptic bronchoscopy and biopsies.  Estimated Blood Loss: 0 cc  Complications: none noted  Indications and History: Lynn [name] is 65 y.o. with history of RA on methotrexate who presented with acute respiratory failure with hypoxia.  Recommendation was to perform video fiberoptic bronchoscopy with biopsies. The risks, benefits, complications, treatment options and expected outcomes were discussed with the patient.  The possibilities of pneumothorax, pneumonia, reaction to medication, pulmonary aspiration, perforation of a viscus, bleeding, failure to diagnose a condition and creating a complication requiring transfusion or operation were discussed with the patient who freely signed the consent.    Description of Procedure: The patient was seen in the Preoperative Area, was examined and was deemed appropriate to proceed.  The patient was taken to endoscopy, identified as Lynn [name] and the procedure verified as Flexible Video Fiberoptic Bronchoscopy with BAL.  A Time Out was held and the above information confirmed.   Heavy sedation was initiated by Anesthesia as indicated above. The video fiberoptic bronchoscope was introduced via the ETT and a general inspection was performed which showed normal distal trachea, normal main carina. The R sided airways were inspected and showed normal RUL, BI, RML and RLL. The L side was then inspected. The LLL, Lingular and LUL airways were normal. Mild thick whitish secretions. BAL was performed in the lateral RML with 120cc sterile saline instilled and 60cc returned.   Samples: 1.  Bronchial washings  from RML-- sent for cultures (routine, fungal, AFB), cell count with diff, PJP DFA, cytology for GMS, RVP  Plans:  We will review the cytology, pathology and microbiology results with the patient when they become available.  Outpatient followup will be with inpatient pulmonology team.   Case discussed with anesthesia and cardiology. If she is unable to be extubated, she will be transferred to the ICU either at [facility] or [facility].   Julian Hy, DO 03/28/21 2:14 PM [facility]

## 2021-03-28 NOTE — Interval H&P Note (Signed)
History and Physical Interval Note:  03/28/2021 1:09 PM  Lynn [name]  has presented today for surgery, with the diagnosis of endocarditis.  The various methods of treatment have been discussed with the patient and family. After consideration of risks, benefits and other options for treatment, the patient has consented to  Procedure(s): TRANSESOPHAGEAL ECHOCARDIOGRAM (TEE) (N/A) VIDEO BRONCHOSCOPY WITHOUT FLUORO (N/A) as a surgical intervention.  The patient's history has been reviewed, patient examined, no change in status, stable for surgery.  I have reviewed the patient's chart and labs.  Questions were answered to the patient's satisfaction.    I examined Lynn [name] at bedside and discussed the bronchoscopy procedure planned, including risks, benefits, alternatives. She understands and signed her consent. No increased SOB today compared to previous days.   BP (!) 126/57   Pulse 62   Temp (!) 97.4 F (36.3 C) (Oral)   Resp (!) 22   Ht _0  (1.626 m)   Wt 108.3 kg   LMP  (LMP Unknown)   SpO2 100%   BMI 40.98 kg/m  Breathing comfortably on 6L [place]. Mild expiratory wheezing, no conversationa dyspnea S1S2, RRR No significant edema. Awake, alert, normal speech, answering questions appropriately.  Planning to proceed to bronch with BAL.  Lynn [name]

## 2021-03-28 NOTE — Transfer of Care (Signed)
Immediate Anesthesia Transfer of Care Note  Patient: Lynn [name]  Procedure(s) Performed: TRANSESOPHAGEAL ECHOCARDIOGRAM (TEE) VIDEO BRONCHOSCOPY WITHOUT FLUORO BRONCHIAL WASHINGS  Patient Location: PACU  Anesthesia Type:General  Level of Consciousness: drowsy  Airway & Oxygen Therapy: Patient Spontanous Breathing and Patient connected to face mask oxygen  Post-op Assessment: Report given to RN and Post -op Vital signs reviewed and stable  Post vital signs: Reviewed and stable  Last Vitals:  Vitals Value Taken Time  BP    Temp    Pulse    Resp    SpO2      Last Pain:  Vitals:   03/28/21 1306  TempSrc: Oral  PainSc: 0-No pain         Complications: No notable events documented.

## 2021-03-28 NOTE — Progress Notes (Signed)
  Echocardiogram Echocardiogram Transesophageal has been performed.  Lynn [name] 03/28/2021, 2:57 PM

## 2021-03-28 NOTE — Progress Notes (Addendum)
Progress Note  Patient Name: Salvatore Poe Date of Encounter: 03/28/2021  [facility] Cardiologist: Glenetta Hew, MD   Subjective   No chest pain, still SOB at times though able to sleep flat.  + night sweats.  Inpatient Medications    Scheduled Meds:  arformoterol  15 mcg Nebulization BID   atorvastatin  80 mg Oral q1800   azithromycin  500 mg Oral Daily   diltiazem  60 mg Oral Q8H   fluticasone  1 spray Each Nare Daily   insulin aspart  0-20 Units Subcutaneous TID WC   insulin aspart  0-5 Units Subcutaneous QHS   insulin aspart  6 Units Subcutaneous TID WC   insulin glargine  25 Units Subcutaneous QHS   loratadine  10 mg Oral Daily   losartan  100 mg Oral Daily   methylPREDNISolone (SOLU-MEDROL) injection  40 mg Intravenous Q24H   multivitamin with minerals  1 tablet Oral Daily   revefenacin  175 mcg Nebulization Daily   saccharomyces boulardii  250 mg Oral BID   Continuous Infusions:  sodium chloride     sodium chloride 75 mL/hr at 03/28/21 0820   cefTRIAXone (ROCEPHIN)  IV 2 g (03/28/21 0823)   vancomycin Stopped (03/27/21 1754)   PRN Meds: acetaminophen **OR** acetaminophen, albuterol, guaiFENesin-dextromethorphan, HYDROcodone bit-homatropine, ondansetron (ZOFRAN) IV   Vital Signs    Vitals:   03/27/21 2032 03/27/21 2157 03/28/21 0500 03/28/21 0509  BP: 111/62 (!) 150/73  115/70  Pulse: 78   62  Resp: 16   16  Temp: 98.3 F (36.8 C)   (!) 97.4 F (36.3 C)  TempSrc: Oral   Oral  SpO2: 93%   99%  Weight:   108.3 kg   Height:        Intake/Output Summary (Last 24 hours) at 03/28/2021 0841 Last data filed at 03/28/2021 0030 Gross per 24 hour  Intake 600 ml  Output 650 ml  Net -50 ml   Last 3 Weights 03/28/2021 03/27/2021 03/25/2021  Weight (lbs) 238 lb 12.1 oz 234 lb 1.6 oz 234 lb 5.6 oz  Weight (kg) 108.3 kg 106.187 kg 106.3 kg      Telemetry    SR - Personally Reviewed  ECG    No new - Personally Reviewed  Physical Exam    GEN: No acute distress.   Neck: mild JVD Cardiac: RRR, no murmurs, rubs, or gallops.  Respiratory: Clear to auscultation bilaterally. Though congested cough, + productive white mucus GI: Soft, nontender, non-distended  MS: No edema; No deformity. Neuro:  Nonfocal  Psych: Normal affect   Labs    High Sensitivity Troponin:  No results for input(s): TROPONINIHS in the last 720 hours.    Chemistry Recent Labs  Lab 03/26/21 0402 03/26/21 2303 03/27/21 0351 03/27/21 1132 03/28/21 0333  NA 132*   < > 133* 133* 133*  K 4.0   < > 4.7 4.3 4.9  CL 100   < > 99 97* 99  CO2 24   < > 27 25 28   GLUCOSE 227*   < > 393* 467* 379*  BUN 15   < > 19 22 25*  CREATININE 1.13*   < > 1.23* 1.11* 1.16*  CALCIUM 8.7*   < > 9.1 9.4 9.3  PROT 7.1  --  7.5  --  7.6  ALBUMIN 3.2*  --  3.1*  --  3.3*  AST 18  --  18  --  18  ALT 18  --  18  --  22  ALKPHOS 62  --  68  --  84  BILITOT 0.7  --  0.6  --  0.2*  GFRNONAA 53*   < > 48* 54* 52*  ANIONGAP 8   < > 7 11 6    < > = values in this interval not displayed.     Hematology Recent Labs  Lab 03/26/21 0402 03/27/21 0351 03/27/21 1132 03/28/21 0333  WBC 14.6* 18.0*  --  30.2*  RBC 4.33 4.28 4.49 4.33  HGB 8.1* 8.0*  --  8.1*  HCT 27.3* 27.2*  --  27.6*  MCV 63.0* 63.6*  --  63.7*  MCH 18.7* 18.7*  --  18.7*  MCHC 29.7* 29.4*  --  29.3*  RDW 18.1* 18.5*  --  18.6*  PLT 349 361  --  412*    BNP Recent Labs  Lab 03/26/21 1457  BNP 152.5*     DDimer  Recent Labs  Lab 03/26/21 1433 03/27/21 0351  DDIMER 2.23* 2.65*     Radiology    MR BRAIN W WO CONTRAST  Result Date: 03/26/2021 CLINICAL DATA:  Respiratory failure, possible endocarditis EXAM: MRI HEAD WITHOUT AND WITH CONTRAST TECHNIQUE: Multiplanar, multiecho pulse sequences of the brain and surrounding structures were obtained without and with intravenous contrast. CONTRAST:  64mL GADAVIST GADOBUTROL 1 MMOL/ML IV SOLN COMPARISON:  Correlation made with CT from 2018  FINDINGS: Brain: There is no acute infarction or intracranial hemorrhage. There is no intracranial mass, mass effect, or edema. Chronic left occipital volume loss with corresponding susceptibility reflecting mineralization noted on prior CT. Cortical enhancement in this region is noted along with increased cortical veins. There is relative prominence of the left basal vein and internal cerebral vein. There is no additional abnormal enhancement. There is no hydrocephalus or extra-axial fluid collection. Ventricles and sulci are normal in size and configuration. Vascular: Major vessel flow voids at the skull base are preserved. Skull and upper cervical spine: Normal marrow signal is preserved. Sinuses/Orbits: Paranasal sinuses are aerated. Orbits are unremarkable. Other: Sella is partially empty. Minor patchy mastoid fluid opacification. IMPRESSION: No evidence of septic emboli are other acute abnormality. Chronic left parietal volume loss with gyral calcification. Increased cortical veins in this region and relative prominence of left deep cerebral veins raise possibility of a dural arteriovenous fistula. Electronically Signed   By: Macy Mis M.D.   On: 03/26/2021 18:21   DG CHEST PORT 1 VIEW  Result Date: 03/26/2021 CLINICAL DATA:  Increasing shortness of breath. EXAM: PORTABLE CHEST 1 VIEW COMPARISON:  03/25/2021, 03/23/2021 and CT 03/21/2021 FINDINGS: Lungs are adequately inflated with persistent hazy bilateral interstitium which may be due to mild interstitial edema versus atypical infection. No effusion. Cardiomediastinal silhouette is unchanged. Focal calcification over the mitral valve annulus unchanged. Remainder of the exam is unchanged. IMPRESSION: Persistent hazy bilateral interstitium which may be due to interstitial edema versus atypical infection. Electronically Signed   By: Marin Olp M.D.   On: 03/26/2021 14:46    Cardiac Studies   2D echo 03/2021    1. Left ventricular ejection  fraction, by estimation, is 60 to 65%. The  left ventricle has normal function. The left ventricle has no regional  wall motion abnormalities. There is moderate left ventricular hypertrophy  of the basal-septal segment. Left  ventricular diastolic parameters are consistent with Grade II diastolic  dysfunction (pseudonormalization). Elevated left ventricular end-diastolic  pressure.   2. Right ventricular systolic function is normal. The right  ventricular  size is normal. There is mildly elevated pulmonary artery systolic  pressure. The estimated right ventricular systolic pressure is 09.9 mmHg.   3. Left atrial size was mildly dilated.   4. The mitral valve is abnormal. There is an ill defined density  measuring 3.23 x 2.10cm that appears to originate off the posterior MV  leaflet but cannot be definite on this. There is also mitral annular  calcification present. There is moderate mitral  stenosis with a mean MVG of 71mmHg. No evidence of mitral valve  regurgitation.   5. The aortic valve is normal in structure. Aortic valve regurgitation is  not visualized. No aortic stenosis is present.   6. The inferior vena cava is normal in size with greater than 50%  respiratory variability, suggesting right atrial pressure of 3 mmHg.   7. Recommend TEE for possible mitral valve endocarditis in setting of  abnormal mitral valve and fevers.   LHC 2018 Moderate to heavy coronary calcification. Segmental 40-50% LAD narrowing. 30-50% proximal to mid RCA. Coronaries are widely patent without high-grade stenosis. Fluoroscopy demonstration of inferolateral calcified mass, likely representing mitral annular calcification. Consider CT or MRI if there is not heavy mitral valve calcification on echocardiography. Spontaneous runs of supraventricular tachycardia during the procedure.     RECOMMENDATIONS:   No significant obstructive coronary disease is identified. Etiology of enzyme elevation and  clinical presentation could potentially be supraventricular arrhythmia superimposed on the cardiac substrate noted above.    Patient Profile     65 y.o. female with a hx of RA, CAD, presumed coronary vasospasm, mitral stenosis, anxiety, stomach ulcers, HTN, HLD, former tobacco use, microcytic anemia, OSA, probable CKD stage IIIa by labs, seizures, thalassemia, DM2, PAT now with possible endocarditis and episode of atrial fib.    Assessment & Plan    1. Fever of unknown origin with acute respiratory failure and interstitial changes on lung imaging - 2D echo raised question of possible mitral valve endocarditis, described in an area previously known to have opacification by fluoroscopy in 2018 and prior echocardiogram -for TEE today at [facility] for further eval.  Permit reviewed yesterday see note -also plan for bronch with CCM  2. Newly recognized atrial fibrillation - 3 hours on 03/26/21  maintaining SR - not on [place] prior to admission - CHADSVASc 5 but episode was very brief (not on ASA PTA - noet hx of stomach ulcers remotely, also question of dural AVF on MRI) for short episode af a fib will hold anticoagulation at this time, but continue to monitor - TSH wnl - continue diltiazem  3. H/o nonobstructive CAD/coronary vasospasm - recent OP troponin negative, no angina - long acting diltiazem was changed to short acting on arrival, consider consolidating back - previously on Imdur, listed as not taking PTA    4. History of diastolic dysfunction - not on diuretic PTA, no history of clinical heart failure by record - BNP generally low this admission - euvolemic     5. Moderate mitral stenosis - continue to follow as OP, eval by TEE   Other medical issues include per IM - Microcytic anemia with h/o thalassemia - Suspected CKD stage IIIa by historical creatinine - Abnormal brain MRI raising question of dural AV fistula - DM with elevated blood sugars  For questions or updates, please  contact [facility] Please consult [email or web address] for contact info under        Signed, Cecilie Kicks, NP  03/28/2021, 8:41 AM    Unknown  origin of fevers.  Discussed with Dr. Carlyle Basques of infectious disease.  Transesophageal echocardiogram personally reviewed and discussed with multiple physicians.  Differential diagnosis of globular mitral valve mass includes caseating mitral annular calcification.  We are going to obtain a cardiac MRI to further evaluate.  Discussed with Cecilie Kicks to order.  Patient is doing fine post procedure.  Candee Furbish, MD

## 2021-03-28 NOTE — H&P (View-Only) (Signed)
 NAME:  Lynn [name], MRN:  [id number], DOB:  08/12/1956, LOS: 5 ADMISSION DATE:  03/23/2021, CONSULTATION DATE:  03/25/21 REFERRING MD:  Elizabeth Mathews,MD CHIEF COMPLAINT:  respiratory failure, fevers   History of Present Illness:  Lynn [name] is a 65 year old woman with rheumatoid arthritis on methotrexate, HFpEF, hypertension and chronic anemia due to thalassemia, DMII who was admitted on 03/23/21 with acute hypoxemic respiratory failure.   She has been treated with doxycycline and azithromycin for concern of bronchitis. She has spiked fevers since admission with a Tmax of 101.3F. She has a dry cough.   CTA chest on 6/7 did not show pulmonary emboli. She has scattered diffuse areas of ground glass attenuation. Covid and influenz PCR is negative.   PCCM has been consulted for further evaluation and recommendations of her respiratory failure and fevers.   Patient reports having fevers, sweats and chills over the past 3 weeks.  She also reports lack of appetite and about a 10 pound weight loss.  She also reports increased knee pain where she had a prior knee replacement and also increased lower back pain.  She raised concerns for silicosis and she does not have any risk factors for this disease as she has been a bus driver.  No exposure to stone cutting or other occupations of the sort.  Pertinent  Medical History  Rheumatoid Arthritis Chronic Diastolic Heart Failure Hypertension Microcytic Anemia - Thalassemia DMII  Significant Hospital Events: Including procedures, antibiotic start and stop dates in addition to other pertinent events   Admitted 03/23/2021 6/11 with increasing oxygen needs to 5L  Antibiotics: Doxycycline 6/9 - 6/10 Azithromycin 6/10 -6/15 Ceftriaxone 6/11>> Vancomycin 6/11>>  Interim History / Subjective:  Feels about the same Slept poorly Questions about bronchoscopy discussed  afebrile   Objective   Blood pressure 115/70, pulse 62,  temperature (!) 97.4 F (36.3 C), temperature source Oral, resp. rate 16, height 5' 4" (1.626 m), weight 108.3 kg, SpO2 99 %.        Intake/Output Summary (Last 24 hours) at 03/28/2021 0908 Last data filed at 03/28/2021 0030 Gross per 24 hour  Intake 600 ml  Output 650 ml  Net -50 ml   Filed Weights   03/25/21 0500 03/27/21 0353 03/28/21 0500  Weight: 106.3 kg 106.2 kg 108.3 kg    Examination: General: Obese, does not appear in distress HENT: Moist oral mucosa Lungs: Bibasal rales Cardiovascular: S1-S2 appreciated Abdomen: Soft, bowel sounds appreciated Extremities: Warm and dry Neuro: Moving all extremities, alert and oriented x3 GU: Deferred  Labs/imaging that I have personally reviewed  (right click and "Reselect all SmartList Selections" daily)  CRP 2.8, ESR , LDH 197, CK 213, Ferritin 130, BNP 57  Leukocytosis  UA - negative for protein, nitrite, small hgb - 11-20 BRCs  Procal <0.1 Blood culture 6/10 >> Urine Culture - too many colonies Covid/influenza - negative  CT Abdomen 03/23/21 1. No acute abnormality identified in the abdomen and pelvis. 2. Fatty infiltration of liver. 3. Diverticulosis of colon without diverticulitis. 4. Aortic atherosclerosis.  CTA Chest 03/23/21 Emboli noted.  No mediastinal or hilar adenopathy.  Scattered groundglass attenuation scattered bilaterally concerning for air trapping.  Echo 03/23/21 EF 60 to 65%.  Moderate left ventricular hypertrophy.  RV systolic function is normal, RV size is normal.  Mild elevated pulmonary artery systolic pressure estimated at 37.3 mmHg.  Mitral valve is abnormal with ill-defined density measuring 3.23 x 2.10 cm.  CXR 6/11 -increased interstitial prominence bilaterally    CXR 6/12 -persistent interstitial prominence bilaterally, no new opacities  Resolved Hospital Problem list     Assessment & Plan:  Acute hypoxemic respiratory failure -Continue to wean oxygen supplementation as  tolerated -Differential diagnosis includes atypical pneumonia, interstitial process, interstitial pneumonia, pulmonary edema -Work-up for inflammation so far negative, continue to trend -For bronchoscopy today at about 130, this is to proceed following TEE -BAL for cell count, respiratory culture, fungal culture, PJP PCR -Lab work sent to [facility] still pending -Discontinue doses of azithromycin following dose today-will make 5 days of azithromycin  Concern for endocarditis -Echocardiogram result noted -For TEE today -Blood cultures from 6/10 negative -Fever is resolved  Leukocytosis -Likely related to steroids  We will plan to switch to oral steroids following bronchoscopy   Consent for bronchoscopy on chart  Best practice (right click and "Reselect all SmartList Selections" daily)  Per primary team  Labs   CBC: Recent Labs  Lab 03/23/21 1301 03/26/21 0402 03/27/21 0351 03/28/21 0333  WBC 8.6 14.6* 18.0* 30.2*  NEUTROABS 6.0  --   --   --   HGB 9.3* 8.1* 8.0* 8.1*  HCT 32.1* 27.3* 27.2* 27.6*  MCV 63.6* 63.0* 63.6* 63.7*  PLT 368 349 361 412*    Basic Metabolic Panel: Recent Labs  Lab 03/26/21 0402 03/26/21 2303 03/27/21 0351 03/27/21 1132 03/28/21 0333  NA 132* 132* 133* 133* 133*  K 4.0 4.5 4.7 4.3 4.9  CL 100 99 99 97* 99  CO2 _0 GLUCOSE 227* 429* 393* 467* 379*  BUN _1 25*  CREATININE 1.13* 1.30* 1.23* 1.11* 1.16*  CALCIUM 8.7* 9.0 9.1 9.4 9.3   GFR: Estimated Creatinine Clearance: 56.5 mL/min (A) (by C-G formula based on SCr of 1.16 mg/dL (H)). Recent Labs  Lab 03/23/21 1301 03/24/21 0225 03/26/21 0402 03/27/21 0351 03/28/21 0333  PROCALCITON  --  <0.10  --   --   --   WBC 8.6  --  14.6* 18.0* 30.2*    Liver Function Tests: Recent Labs  Lab 03/23/21 1301 03/26/21 0402 03/27/21 0351 03/28/21 0333  AST _2 ALT _3 ALKPHOS 69 62 68 84  BILITOT 0.4 0.7 0.6 0.2*  PROT 7.4 7.1 7.5 7.6  ALBUMIN  3.5 3.2* 3.1* 3.3*   No results for input(s): LIPASE, AMYLASE in the last 168 hours. No results for input(s): AMMONIA in the last 168 hours.  ABG    Component Value Date/Time   HCO3 25.6 03/24/2021 0230   TCO2 28 07/03/2017 0814   O2SAT 99.0 03/24/2021 0230     Coagulation Profile: No results for input(s): INR, PROTIME in the last 168 hours.  Cardiac Enzymes: Recent Labs  Lab 03/25/21 1126  CKTOTAL 213    HbA1C: Hemoglobin A1C  Date/Time Value Ref Range Status  01/30/2021 03:24 PM 8.0 (A) 4.0 - 5.6 % Final  09/23/2020 02:54 PM 8.5 (A) 4.0 - 5.6 % Final   Hgb A1c MFr Bld  Date/Time Value Ref Range Status  03/21/2020 02:52 PM 9.8 (H) 4.6 - 6.5 % Final    Comment:    Glycemic Control Guidelines for People with Diabetes:Non Diabetic:  <6%Goal of Therapy: <7%Additional Action Suggested:  >8%   07/15/2019 02:29 PM 10.5 (H) 4.6 - 6.5 % Final    Comment:    Glycemic Control Guidelines for People with Diabetes:Non Diabetic:  <6%Goal of Therapy: <7%Additional Action Suggested:  >8%     CBG: Recent  Labs  Lab 03/27/21 1606 03/27/21 2030 03/28/21 0005 03/28/21 0507 03/28/21 [place]    Sherrilyn Rist, MD [place] [facility] Pager: [phone]

## 2021-03-28 NOTE — Interval H&P Note (Signed)
History and Physical Interval Note:  03/28/2021 12:43 PM  Lynn [name]  has presented today for surgery, with the diagnosis of endocarditis.  The various methods of treatment have been discussed with the patient and family. After consideration of risks, benefits and other options for treatment, the patient has consented to  Procedure(s): TRANSESOPHAGEAL ECHOCARDIOGRAM (TEE) (N/A) VIDEO BRONCHOSCOPY WITHOUT FLUORO (N/A) as a surgical intervention.  The patient's history has been reviewed, patient examined, no change in status, stable for surgery.  I have reviewed the patient's chart and labs.  Questions were answered to the patient's satisfaction.     Mertie Moores

## 2021-03-28 NOTE — Progress Notes (Addendum)
PROGRESS NOTE    Myrta Mercer  POE:[id number] DOB: 07-29-1956 DOA: 03/23/2021 PCP: [name], MD   Brief Narrative: 65 year old female with RA on methotrexate, diastolic heart failure, hypertension, hyperlipidemia, thalassemia type 2 diabetes admitted with fever of unknown origin. Patient reports having fever of 10 1-1 02 at home associated with some shortness of breath and nonproductive cough, she was started on doxycycline for possible bronchitis.  She continued to have fever in spite of antibiotics. Patient reports traveling to [place] for a funeral a month ago.  She has not had COVID but she has taken COVID-vaccine. CT of the chest abdomen and pelvis shows no evidence of acute or active infection. She is not on oxygen at home Procalcitonin is low, sed rate is 46 CRP is 2.8 She is a former smoker MRI of the brain with AV malformation  Assessment & Plan:   Principal Problem:   Acute respiratory failure with hypoxia (HCC) Active Problems:   Essential hypertension   Chronic diastolic CHF (congestive heart failure) (HCC)   Type 2 diabetes mellitus with hyperglycemia, with long-term current use of insulin (HCC)   Acute bronchitis   SOB (shortness of breath)   FUO (fever of unknown origin)   Hypokalemia   Generalized weakness   Acute bacterial endocarditis   # Acute hypoxic respiratory failure-of unclear etiology.  Patient has no history of asthma or COPD.  She is a former smoker.   CT chest no evidence of pulmonary embolism infiltrates atelectasis or pleural effusion noted. She has a normal procalcitonin  Crp trending up 9.5 from 2.8 Esr trending up 68 from 47  LDH 197  CPK 213 BNP 197 Ferritin 130 D-dimer 2.65 up from 0.55 WBC 14.6 from 8.6 prior to starting Solu-Medrol WBC still trending up to 30.2. Hemoglobin 8.1 from 9.3 TSH 4.01  Chest x-ray 03/25/2021-persistent patchy nodular interstitial process in the lungs could be due to atypical/viral  pneumonia or reactive airway disease/hypersensitivity pneumonitis/cryptogenic organizing pneumonia COVID-negative flu negative respiratory virus panel negative, hepatitis panel normal, cryptococcal antigen negative HIV nonreactive.. Azithromycin started 03/24/2021 Rocephin started 03/25/2021 for possible CAP .   Vancomycin started 03/25/2021 for possible mitral valve endocarditis.  Renal functions need to be monitored closely.  Creatinine clearance 57. Discussed with PCCM cardiology and ID   Nebulizer treatments  ADDING solumedrol 6/12 as patient diffusely wheezing and loud wheezing. Echocardiogram-action fraction 60 to 65%.  Abnormal density in the mitral valve. TEE and bronchoscopy on 03/28/2021 at 130 at [facility] discussed with patient and husband  #Fever of unknown origin work-up reveals possible mitral valve endocarditis with negative blood cultures, possible interstitial lung disease reactive airway disease or hypersensitivity pneumonitis.  Patient is on azithromycin Rocephin and vancomycin.  For TEE and bronchoscopy on 03/28/2021.  #History CAD nonobstructive/chronic diastolic heart failure -stable on hctz at home which has not been restarted .  Echo with ejection fraction 60 to 65%.  #History of essential hypertension on losartan and diltiazem.  Restarted   #type 2 dm -on insulin pump at home with Victoza.  Will increase SSI to resistant, Lantus 6 units nightly and 3 units of NovoLog 3 times a day prior to meals. CBG (last 3)  Recent Labs    03/28/21 0005 03/28/21 0507 03/28/21 0753  GLUCAP 335* 346* 318*   Will increase Lantus to 35 units nightly  #Chronic microcytic anemia/thalassemia Baseline hemoglobin 9-10 monitor daily.  Hemoglobin dropping to 8 with no signs of active bleeding.  FOBT fordered.   # rule  out mitral valve endocarditis blood cultures negative.  Trans thoracic echo with density in the mitral valve.  Patient scheduled for transesophageal echo tomorrow at 1:30 PM.  #  paroxysmal atrial tachycardia on Cardizem at home.  Patient went into a brief episode of A. fib RVR and rate of 110-1 20s given Cardizem.  She converted to sinus rhythm in less than 3 hours. Holding off on any anticoagulation at this point per cardiology .Marland Kitchen  #Possible sleep apnea with desaturations overnight sleep study as outpatient  Estimated body mass index is 40.98 kg/m as calculated from the following:   Height as of this encounter: _0  (1.626 m).   Weight as of this encounter: 108.3 kg.  DVT prophylaxis: lovenox Code Status: full Family Communication: Discussed with husband at bedside Disposition Plan:  Status is: Inpatient  Remains inpatient appropriate because:Persistent severe electrolyte disturbances  Dispo: The patient is from: Home              Anticipated d/c is to: Home              Patient currently is not medically stable to d/c.   Difficult to place patient No   Consultants: id  Procedures: none Antimicrobials: none  Subjective:  She reports feeling okay had coughing spells overnight her oxygen demand is increasing now on 7 L. Continues to have cough And continues to be short of breath with dyspnea on exertion  Objective: Vitals:   03/27/21 2032 03/27/21 2157 03/28/21 0500 03/28/21 0509  BP: 111/62 (!) 150/73  115/70  Pulse: 78   62  Resp: 16   16  Temp: 98.3 F (36.8 C)   (!) 97.4 F (36.3 C)  TempSrc: Oral   Oral  SpO2: 93%   99%  Weight:   108.3 kg   Height:        Intake/Output Summary (Last 24 hours) at 03/28/2021 1048 Last data filed at 03/28/2021 0030 Gross per 24 hour  Intake 360 ml  Output 650 ml  Net -290 ml    Filed Weights   03/25/21 0500 03/27/21 0353 03/28/21 0500  Weight: 106.3 kg 106.2 kg 108.3 kg    Examination:  General exam: in nad  Respiratory system: Wheezing b/l to auscultation. Respiratory effort normal. Cardiovascular system: S1 & S2 heard, RRR. No JVD, murmurs, rubs, gallops or clicks. No pedal  edema. Gastrointestinal system: Abdomen is nondistended, soft and nontender. No organomegaly or masses felt. Normal bowel sounds heard. Central nervous system: Alert and oriented. No focal neurological deficits. Extremities: Symmetric 5 x 5 power. Skin: No rashes, lesions or ulcers Psychiatry: Judgement and insight appear normal. Mood & affect appropriate.     Data Reviewed: I have personally reviewed following labs and imaging studies  CBC: Recent Labs  Lab 03/23/21 1301 03/26/21 0402 03/27/21 0351 03/28/21 0333  WBC 8.6 14.6* 18.0* 30.2*  NEUTROABS 6.0  --   --   --   HGB 9.3* 8.1* 8.0* 8.1*  HCT 32.1* 27.3* 27.2* 27.6*  MCV 63.6* 63.0* 63.6* 63.7*  PLT 368 349 361 412*    Basic Metabolic Panel: Recent Labs  Lab 03/26/21 0402 03/26/21 2303 03/27/21 0351 03/27/21 1132 03/28/21 0333  NA 132* 132* 133* 133* 133*  K 4.0 4.5 4.7 4.3 4.9  CL 100 99 99 97* 99  CO2 _1 GLUCOSE 227* 429* 393* 467* 379*  BUN _2 25*  CREATININE 1.13* 1.30* 1.23* 1.11* 1.16*  CALCIUM 8.7* 9.0  9.1 9.4 9.3    GFR: Estimated Creatinine Clearance: 56.5 mL/min (A) (by C-G formula based on SCr of 1.16 mg/dL (H)). Liver Function Tests: Recent Labs  Lab 03/23/21 1301 03/26/21 0402 03/27/21 0351 03/28/21 0333  AST _0 ALT _1 ALKPHOS 69 62 68 84  BILITOT 0.4 0.7 0.6 0.2*  PROT 7.4 7.1 7.5 7.6  ALBUMIN 3.5 3.2* 3.1* 3.3*    No results for input(s): LIPASE, AMYLASE in the last 168 hours. No results for input(s): AMMONIA in the last 168 hours. Coagulation Profile: No results for input(s): INR, PROTIME in the last 168 hours. Cardiac Enzymes: Recent Labs  Lab 03/25/21 1126  CKTOTAL 213    BNP (last 3 results) Recent Labs    03/20/21 1220  PROBNP 57.0    HbA1C: No results for input(s): HGBA1C in the last 72 hours. CBG: Recent Labs  Lab 03/27/21 1606 03/27/21 2030 03/28/21 0005 03/28/21 0507 03/28/21 0753  GLUCAP 313* 337* 335* 346*  318*    Lipid Profile: No results for input(s): CHOL, HDL, LDLCALC, TRIG, CHOLHDL, LDLDIRECT in the last 72 hours. Thyroid Function Tests: No results for input(s): TSH, T4TOTAL, FREET4, T3FREE, THYROIDAB in the last 72 hours. Anemia Panel: Recent Labs    03/25/21 1126 03/27/21 1132  VITAMINB12  --  291  FOLATE  --  6.5  FERRITIN 130 194  TIBC  --  233*  IRON  --  38  RETICCTPCT  --  2.3    Sepsis Labs: Recent Labs  Lab 03/24/21 0225  PROCALCITON <0.10     Recent Results (from the past 240 hour(s))  Resp Panel by RT-PCR (Flu A&B, Covid) Nasopharyngeal Swab     Status: None   Collection Time: 03/23/21  5:32 PM   Specimen: Nasopharyngeal Swab; Nasopharyngeal(NP) swabs in vial transport medium  Result Value Ref Range Status   SARS Coronavirus 2 by RT PCR NEGATIVE NEGATIVE Final    Comment: (NOTE) SARS-CoV-2 target nucleic acids are NOT DETECTED.  The SARS-CoV-2 RNA is generally detectable in upper respiratory specimens during the acute phase of infection. The lowest concentration of SARS-CoV-2 viral copies this assay can detect is 138 copies/mL. A negative result does not preclude SARS-Cov-2 infection and should not be used as the sole basis for treatment or other patient management decisions. A negative result may occur with  improper specimen collection/handling, submission of specimen other than nasopharyngeal swab, presence of viral mutation(s) within the areas targeted by this assay, and inadequate number of viral copies(<138 copies/mL). A negative result must be combined with clinical observations, patient history, and epidemiological information. The expected result is Negative.  Fact Sheet for Patients:  EntrepreneurPulse.com.au  Fact Sheet for Healthcare Providers:  IncredibleEmployment.be  This test is no t yet approved or cleared by the [place] FDA and  has been authorized for detection and/or diagnosis of  SARS-CoV-2 by FDA under an Emergency Use Authorization (EUA). This EUA will remain  in effect (meaning this test can be used) for the duration of the COVID-19 declaration under Section 564(b)(1) of the Act, 21 U.S.C.section 360bbb-3(b)(1), unless the authorization is terminated  or revoked sooner.       Influenza A by PCR NEGATIVE NEGATIVE Final   Influenza B by PCR NEGATIVE NEGATIVE Final    Comment: (NOTE) The Xpert Xpress SARS-CoV-2/FLU/RSV plus assay is intended as an aid in the diagnosis of influenza from Nasopharyngeal swab specimens and should not be used as a sole basis  for treatment. Nasal washings and aspirates are unacceptable for Xpert Xpress SARS-CoV-2/FLU/RSV testing.  Fact Sheet for Patients: EntrepreneurPulse.com.au  Fact Sheet for Healthcare Providers: IncredibleEmployment.be  This test is not yet approved or cleared by the [place] FDA and has been authorized for detection and/or diagnosis of SARS-CoV-2 by FDA under an Emergency Use Authorization (EUA). This EUA will remain in effect (meaning this test can be used) for the duration of the COVID-19 declaration under Section 564(b)(1) of the Act, 21 U.S.C. section 360bbb-3(b)(1), unless the authorization is terminated or revoked.  Performed at [facility], [place] [address], [place], [place] [address]   Urine culture     Status: Abnormal   Collection Time: 03/23/21  5:32 PM   Specimen: Urine, Clean Catch  Result Value Ref Range Status   Specimen Description   Final    URINE, CLEAN CATCH Performed at [facility], [place] [address], [place], [place] [address]    Special Requests   Final    NONE Performed at [facility], [place] [address], [place], [place] [address]    Culture (A)  Final    <10,000 COLONIES/mL INSIGNIFICANT GROWTH Performed at [facility] [address] [place], [place] [address]     Report Status 03/25/2021 FINAL  Final  Culture, blood (Routine X 2) w Reflex to ID Panel     Status: None (Preliminary result)   Collection Time: 03/24/21  2:25 AM   Specimen: BLOOD  Result Value Ref Range Status   Specimen Description   Final    BLOOD BLOOD LEFT FOREARM Performed at [facility] [address], [place], [place] [address]    Special Requests   Final    BOTTLES DRAWN AEROBIC AND ANAEROBIC Blood Culture adequate volume Performed at [facility] [address], [place], [place] [address]    Culture   Final    NO GROWTH 4 DAYS Performed at [facility], [place] [address], [place], [place] [address]    Report Status PENDING  Incomplete  Culture, blood (Routine X 2) w Reflex to ID Panel     Status: None (Preliminary result)   Collection Time: 03/24/21  2:30 AM   Specimen: BLOOD  Result Value Ref Range Status   Specimen Description   Final    BLOOD BLOOD RIGHT HAND Performed at [facility] [address] [place], [place] [address]    Special Requests   Final    BOTTLES DRAWN AEROBIC AND ANAEROBIC Blood Culture adequate volume Performed at [facility] [address] [place], [place] [address]    Culture   Final    NO GROWTH 4 DAYS Performed at [facility], [place] [address], [place], [place] [address]    Report Status PENDING  Incomplete  Culture, blood (routine x 2)     Status: None (Preliminary result)   Collection Time: 03/24/21 11:30 AM   Specimen: BLOOD  Result Value Ref Range Status   Specimen Description   Final    BLOOD LEFT ANTECUBITAL Performed at [facility], [place] [address], [place], [place] [address]    Special Requests   Final    BOTTLES DRAWN AEROBIC AND ANAEROBIC Blood Culture adequate volume Performed at [facility] [address], [place], [place] [address]    Culture   Final    NO GROWTH 4 DAYS Performed at [facility], [address], [place], [place] [address]    Report Status PENDING  Incomplete  MRSA PCR Screening     Status: None   Collection Time: 03/25/21  1:27 PM   Specimen: Nasal Mucosa; Nasopharyngeal  Result Value Ref Range Status   MRSA by PCR NEGATIVE NEGATIVE Final    Comment:        The GeneXpert MRSA Assay (FDA approved for NASAL specimens only), is one component of a comprehensive MRSA colonization surveillance program. It is not intended to diagnose MRSA infection nor to guide or monitor treatment for MRSA infections. Performed at [facility], [place] [address], [place], [place] [address]   Respiratory (~20 pathogens) panel by PCR     Status: None   Collection Time: 03/26/21  7:50 AM  Result Value Ref Range Status   Adenovirus NOT DETECTED NOT DETECTED Final   Coronavirus 229E NOT DETECTED NOT DETECTED Final    Comment: (NOTE) The Coronavirus on the Respiratory Panel, DOES NOT test for the novel  Coronavirus (2019 nCoV)    Coronavirus HKU1 NOT DETECTED NOT DETECTED Final   Coronavirus NL63 NOT DETECTED NOT DETECTED Final   Coronavirus OC43 NOT DETECTED NOT DETECTED Final   Metapneumovirus NOT DETECTED NOT DETECTED Final   Rhinovirus / Enterovirus NOT DETECTED NOT DETECTED Final   Influenza A NOT DETECTED NOT DETECTED Final   Influenza B NOT DETECTED NOT DETECTED Final   Parainfluenza Virus 1 NOT DETECTED NOT DETECTED Final   Parainfluenza Virus 2 NOT DETECTED NOT DETECTED Final   Parainfluenza Virus 3 NOT DETECTED NOT DETECTED Final   Parainfluenza Virus 4 NOT DETECTED NOT DETECTED Final   Respiratory Syncytial Virus NOT DETECTED NOT DETECTED Final   Bordetella pertussis NOT DETECTED NOT DETECTED Final   Bordetella Parapertussis NOT DETECTED NOT DETECTED Final   Chlamydophila pneumoniae NOT DETECTED NOT DETECTED Final   Mycoplasma pneumoniae NOT DETECTED NOT DETECTED Final    Comment: Performed at [facility], [place]. [address],  [place], [place] [address]          Radiology Studies: MR BRAIN W WO CONTRAST  Result Date: 03/26/2021 CLINICAL DATA:  Respiratory failure, possible endocarditis EXAM: MRI HEAD WITHOUT AND WITH CONTRAST TECHNIQUE: Multiplanar, multiecho pulse sequences of the brain and surrounding structures were obtained without and with intravenous contrast. CONTRAST:  75m GADAVIST GADOBUTROL 1 MMOL/ML IV SOLN COMPARISON:  Correlation made with CT from 2018 FINDINGS: Brain: There is no acute infarction or intracranial hemorrhage. There is no intracranial mass, mass effect, or edema. Chronic left occipital volume loss with corresponding susceptibility reflecting mineralization noted on prior CT. Cortical enhancement in this region is noted along with increased cortical veins. There is relative prominence of the left basal vein and internal cerebral vein. There is no additional abnormal enhancement. There is no hydrocephalus or extra-axial fluid collection. Ventricles and sulci are normal in size and configuration. Vascular: Major vessel flow voids at the skull base are preserved. Skull and upper cervical spine: Normal marrow signal is preserved. Sinuses/Orbits: Paranasal sinuses are aerated. Orbits are unremarkable. Other: Sella is partially empty. Minor patchy mastoid fluid opacification. IMPRESSION: No evidence of septic emboli are other acute abnormality. Chronic left parietal volume loss with gyral calcification. Increased cortical veins in this region and relative prominence of left deep cerebral veins raise possibility of a dural arteriovenous fistula. Electronically Signed   By: PMacy MisM.D.   On: 03/26/2021 18:21   DG CHEST PORT 1 VIEW  Result  Date: 03/26/2021 CLINICAL DATA:  Increasing shortness of breath. EXAM: PORTABLE CHEST 1 VIEW COMPARISON:  03/25/2021, 03/23/2021 and CT 03/21/2021 FINDINGS: Lungs are adequately inflated with persistent hazy bilateral interstitium which may be due to mild interstitial  edema versus atypical infection. No effusion. Cardiomediastinal silhouette is unchanged. Focal calcification over the mitral valve annulus unchanged. Remainder of the exam is unchanged. IMPRESSION: Persistent hazy bilateral interstitium which may be due to interstitial edema versus atypical infection. Electronically Signed   By: Marin Olp M.D.   On: 03/26/2021 14:46        Scheduled Meds:  arformoterol  15 mcg Nebulization BID   atorvastatin  80 mg Oral q1800   diltiazem  60 mg Oral Q8H   fluticasone  1 spray Each Nare Daily   insulin aspart  0-20 Units Subcutaneous TID WC   insulin aspart  0-5 Units Subcutaneous QHS   insulin aspart  6 Units Subcutaneous TID WC   insulin glargine  25 Units Subcutaneous QHS   loratadine  10 mg Oral Daily   losartan  100 mg Oral Daily   methylPREDNISolone (SOLU-MEDROL) injection  40 mg Intravenous Q24H   multivitamin with minerals  1 tablet Oral Daily   revefenacin  175 mcg Nebulization Daily   saccharomyces boulardii  250 mg Oral BID   Continuous Infusions:  sodium chloride     sodium chloride 75 mL/hr at 03/28/21 0820   cefTRIAXone (ROCEPHIN)  IV 2 g (03/28/21 0823)   vancomycin Stopped (03/27/21 1754)     LOS: 5 days    Time spent: 69 min  Georgette Shell, MD 03/28/2021, 10:48 AM

## 2021-03-28 NOTE — Progress Notes (Signed)
Pharmacy Antibiotic Note  Lamont Glasscock is a 65 y.o. female admitted on 03/23/2021 with fevers. ID consulted and following. Pharmacy has been consulted for Vancomycin dosing for possible culture negative endocarditis per ID. Also on rocephin and zithromax currently  D5/5 Zithromax D4 Rocephin/Vanc WBC rising, on steroids Afebrile SCr stable Cultures still pending  Plan: Continue vanc 1g IV q24h. Will get vancomycin levels at steady state, once long term abx decision made Monitor renal function (f/u SCr in AM), cultures, clinical course.   Height: 5\' 4"  (162.6 cm) Weight: 108.3 kg (238 lb 12.1 oz) IBW/kg (Calculated) : 54.7  Temp (24hrs), Avg:97.8 F (36.6 C), Min:97.4 F (36.3 C), Max:98.3 F (36.8 C)  Recent Labs  Lab 03/23/21 1301 03/26/21 0402 03/26/21 2303 03/27/21 0351 03/27/21 1132 03/28/21 0333  WBC 8.6 14.6*  --  18.0*  --  30.2*  CREATININE 1.05* 1.13* 1.30* 1.23* 1.11* 1.16*     Estimated Creatinine Clearance: 56.5 mL/min (A) (by C-G formula based on SCr of 1.16 mg/dL (H)).    Allergies  Allergen Reactions   Penicillins Hives    Childhood allergy Has patient had a PCN reaction causing immediate rash, facial/tongue/throat swelling, SOB or lightheadedness with hypotension: Yes Has patient had a PCN reaction causing severe rash involving mucus membranes or skin necrosis: No Has patient had a PCN reaction that required hospitalization No Has patient had a PCN reaction occurring within the last 10 years: No If all of the above answers are "NO", then may proceed with Cephalosporin use.   [name] [Dapagliflozin] Other (See Comments)    Dizzy and lethargic    Metformin And Related     Diarrhea, bleeding    Antimicrobials this admission: 6/10 Doxycycline >> 6/10 6/10 Azithromycin >> 6/11 Ceftriaxone >> 6/11 Vancomycin >>   Thank you for allowing pharmacy to be a part of this patient's care.  Adrian Saran, PharmD, BCPS Secure Chat if  ?s 03/28/2021 9:41 AM

## 2021-03-28 NOTE — Progress Notes (Signed)
 NAME:  Lynn [name], MRN:  [id number], DOB:  07/23/1956, LOS: 5 ADMISSION DATE:  03/23/2021, CONSULTATION DATE:  03/25/21 REFERRING MD:  Elizabeth Mathews,MD CHIEF COMPLAINT:  respiratory failure, fevers   History of Present Illness:  Lynn [name] is a 65 year old woman with rheumatoid arthritis on methotrexate, HFpEF, hypertension and chronic anemia due to thalassemia, DMII who was admitted on 03/23/21 with acute hypoxemic respiratory failure.   She has been treated with doxycycline and azithromycin for concern of bronchitis. She has spiked fevers since admission with a Tmax of 101.3F. She has a dry cough.   CTA chest on 6/7 did not show pulmonary emboli. She has scattered diffuse areas of ground glass attenuation. Covid and influenz PCR is negative.   PCCM has been consulted for further evaluation and recommendations of her respiratory failure and fevers.   Patient reports having fevers, sweats and chills over the past 3 weeks.  She also reports lack of appetite and about a 10 pound [name] loss.  She also reports increased knee pain where she had a prior knee replacement and also increased lower back pain.  She raised concerns for silicosis and she does not have any risk factors for this disease as she has been a bus driver.  No exposure to stone cutting or other occupations of the sort.  Pertinent  Medical History  Rheumatoid Arthritis Chronic Diastolic Heart Failure Hypertension Microcytic Anemia - Thalassemia DMII  Significant Hospital Events: Including procedures, antibiotic start and stop dates in addition to other pertinent events   Admitted 03/23/2021 6/11 with increasing oxygen needs to 5L  Antibiotics: Doxycycline 6/9 - 6/10 Azithromycin 6/10 -6/15 Ceftriaxone 6/11>> Vancomycin 6/11>>  Interim History / Subjective:  Feels about the same Slept poorly Questions about bronchoscopy discussed  afebrile   Objective   Blood pressure 115/70, pulse 62,  temperature (!) 97.4 F (36.3 C), temperature source Oral, resp. rate 16, height 5' 4" (1.626 m), [name] 108.3 kg, SpO2 99 %.        Intake/Output Summary (Last 24 hours) at 03/28/2021 0908 Last data filed at 03/28/2021 0030 Gross per 24 hour  Intake 600 ml  Output 650 ml  Net -50 ml   Filed Weights   03/25/21 0500 03/27/21 0353 03/28/21 0500  [name]: 106.3 kg 106.2 kg 108.3 kg    Examination: General: Obese, does not appear in distress HENT: Moist oral mucosa Lungs: Bibasal rales Cardiovascular: S1-S2 appreciated Abdomen: Soft, bowel sounds appreciated Extremities: Warm and dry Neuro: Moving all extremities, alert and oriented x3 GU: Deferred  Labs/imaging that I have personally reviewed  (right click and "Reselect all SmartList Selections" daily)  CRP 2.8, ESR , LDH 197, CK 213, Ferritin 130, BNP 57  Leukocytosis  UA - negative for protein, nitrite, small hgb - 11-20 BRCs  Procal <0.1 Blood culture 6/10 >> Urine Culture - too many colonies Covid/influenza - negative  CT Abdomen 03/23/21 1. No acute abnormality identified in the abdomen and pelvis. 2. Fatty infiltration of liver. 3. Diverticulosis of colon without diverticulitis. 4. Aortic atherosclerosis.  CTA Chest 03/23/21 Emboli noted.  No mediastinal or hilar adenopathy.  Scattered groundglass attenuation scattered bilaterally concerning for air trapping.  Echo 03/23/21 EF 60 to 65%.  Moderate left ventricular hypertrophy.  RV systolic function is normal, RV size is normal.  Mild elevated pulmonary artery systolic pressure estimated at 37.3 mmHg.  Mitral valve is abnormal with ill-defined density measuring 3.23 x 2.10 cm.  CXR 6/11 -increased interstitial prominence bilaterally    CXR 6/12 -persistent interstitial prominence bilaterally, no new opacities  Resolved Hospital Problem list     Assessment & Plan:  Acute hypoxemic respiratory failure -Continue to wean oxygen supplementation as  tolerated -Differential diagnosis includes atypical pneumonia, interstitial process, interstitial pneumonia, pulmonary edema -Work-up for inflammation so far negative, continue to trend -For bronchoscopy today at about 130, this is to proceed following TEE -BAL for cell count, respiratory culture, fungal culture, PJP PCR -Lab work sent to [facility] still pending -Discontinue doses of azithromycin following dose today-will make 5 days of azithromycin  Concern for endocarditis -Echocardiogram result noted -For TEE today -Blood cultures from 6/10 negative -Fever is resolved  Leukocytosis -Likely related to steroids  We will plan to switch to oral steroids following bronchoscopy   Consent for bronchoscopy on chart  Best practice (right click and "Reselect all SmartList Selections" daily)  Per primary team  Labs   CBC: Recent Labs  Lab 03/23/21 1301 03/26/21 0402 03/27/21 0351 03/28/21 0333  WBC 8.6 14.6* 18.0* 30.2*  NEUTROABS 6.0  --   --   --   HGB 9.3* 8.1* 8.0* 8.1*  HCT 32.1* 27.3* 27.2* 27.6*  MCV 63.6* 63.0* 63.6* 63.7*  PLT 368 349 361 412*    Basic Metabolic Panel: Recent Labs  Lab 03/26/21 0402 03/26/21 2303 03/27/21 0351 03/27/21 1132 03/28/21 0333  NA 132* 132* 133* 133* 133*  K 4.0 4.5 4.7 4.3 4.9  CL 100 99 99 97* 99  CO2 24 24 27 25 28  GLUCOSE 227* 429* 393* 467* 379*  BUN 15 20 19 22 25*  CREATININE 1.13* 1.30* 1.23* 1.11* 1.16*  CALCIUM 8.7* 9.0 9.1 9.4 9.3   GFR: Estimated Creatinine Clearance: 56.5 mL/min (A) (by C-G formula based on SCr of 1.16 mg/dL (H)). Recent Labs  Lab 03/23/21 1301 03/24/21 0225 03/26/21 0402 03/27/21 0351 03/28/21 0333  PROCALCITON  --  <0.10  --   --   --   WBC 8.6  --  14.6* 18.0* 30.2*    Liver Function Tests: Recent Labs  Lab 03/23/21 1301 03/26/21 0402 03/27/21 0351 03/28/21 0333  AST 23 18 18 18  ALT 28 18 18 22  ALKPHOS 69 62 68 84  BILITOT 0.4 0.7 0.6 0.2*  PROT 7.4 7.1 7.5 7.6  ALBUMIN  3.5 3.2* 3.1* 3.3*   No results for input(s): LIPASE, AMYLASE in the last 168 hours. No results for input(s): AMMONIA in the last 168 hours.  ABG    Component Value Date/Time   HCO3 25.6 03/24/2021 0230   TCO2 28 07/03/2017 0814   O2SAT 99.0 03/24/2021 0230     Coagulation Profile: No results for input(s): INR, PROTIME in the last 168 hours.  Cardiac Enzymes: Recent Labs  Lab 03/25/21 1126  CKTOTAL 213    HbA1C: Hemoglobin A1C  Date/Time Value Ref Range Status  01/30/2021 03:24 PM 8.0 (A) 4.0 - 5.6 % Final  09/23/2020 02:54 PM 8.5 (A) 4.0 - 5.6 % Final   Hgb A1c MFr Bld  Date/Time Value Ref Range Status  03/21/2020 02:52 PM 9.8 (H) 4.6 - 6.5 % Final    Comment:    Glycemic Control Guidelines for People with Diabetes:Non Diabetic:  <6%Goal of Therapy: <7%Additional Action Suggested:  >8%   07/15/2019 02:29 PM 10.5 (H) 4.6 - 6.5 % Final    Comment:    Glycemic Control Guidelines for People with Diabetes:Non Diabetic:  <6%Goal of Therapy: <7%Additional Action Suggested:  >8%     CBG: Recent   Labs  Lab 03/27/21 1606 03/27/21 2030 03/28/21 0005 03/28/21 0507 03/28/21 0753  GLUCAP 313* 337* 335* 346* 318*    Benson Porcaro, MD [facility] Pager: [phone]      

## 2021-03-28 NOTE — Progress Notes (Signed)
Transferred to [facility] hospital with care link

## 2021-03-28 NOTE — Progress Notes (Signed)
Consent for bronchoscopy obtained this morning Placed in chart

## 2021-03-28 NOTE — Anesthesia Procedure Notes (Signed)
Procedure Name: Intubation Date/Time: 03/28/2021 1:54 PM Performed by: Colin Benton, CRNA Pre-anesthesia Checklist: Patient identified, Emergency Drugs available, Suction available and Patient being monitored Patient Re-evaluated:Patient Re-evaluated prior to induction Oxygen Delivery Method: Circle system utilized Preoxygenation: Pre-oxygenation with 100% oxygen Induction Type: IV induction Ventilation: Mask ventilation without difficulty Laryngoscope Size: Miller and 2 Grade View: Grade II Tube type: Oral Tube size: 8.5 mm Number of attempts: 1 Airway Equipment and Method: Stylet Placement Confirmation: ETT inserted through vocal cords under direct vision, positive ETCO2 and breath sounds checked- equal and bilateral Secured at: 22 cm Tube secured with: Tape Dental Injury: Teeth and Oropharynx as per pre-operative assessment

## 2021-03-28 NOTE — Plan of Care (Signed)
  Problem: Clinical Measurements: Goal: Respiratory complications will improve Outcome: Completed/Met

## 2021-03-29 LAB — CBC
HCT: 26 % — ABNORMAL LOW (ref 36.0–46.0)
Hemoglobin: 7.6 g/dL — ABNORMAL LOW (ref 12.0–15.0)
MCH: 18.9 pg — ABNORMAL LOW (ref 26.0–34.0)
MCHC: 29.2 g/dL — ABNORMAL LOW (ref 30.0–36.0)
MCV: 64.5 fL — ABNORMAL LOW (ref 80.0–100.0)
Platelets: 370 10*3/uL (ref 150–400)
RBC: 4.03 MIL/uL (ref 3.87–5.11)
RDW: 18.6 % — ABNORMAL HIGH (ref 11.5–15.5)
WBC: 25.6 10*3/uL — ABNORMAL HIGH (ref 4.0–10.5)
nRBC: 0.3 % — ABNORMAL HIGH (ref 0.0–0.2)

## 2021-03-29 LAB — CULTURE, BLOOD (ROUTINE X 2)
Culture: NO GROWTH
Culture: NO GROWTH
Culture: NO GROWTH
Special Requests: ADEQUATE
Special Requests: ADEQUATE
Special Requests: ADEQUATE

## 2021-03-29 LAB — COMPREHENSIVE METABOLIC PANEL
ALT: 25 U/L (ref 0–44)
AST: 21 U/L (ref 15–41)
Albumin: 3.1 g/dL — ABNORMAL LOW (ref 3.5–5.0)
Alkaline Phosphatase: 68 U/L (ref 38–126)
Anion gap: 5 (ref 5–15)
BUN: 27 mg/dL — ABNORMAL HIGH (ref 8–23)
CO2: 27 mmol/L (ref 22–32)
Calcium: 8.7 mg/dL — ABNORMAL LOW (ref 8.9–10.3)
Chloride: 102 mmol/L (ref 98–111)
Creatinine, Ser: 1.17 mg/dL — ABNORMAL HIGH (ref 0.44–1.00)
GFR, Estimated: 51 mL/min — ABNORMAL LOW (ref >60)
Glucose, Bld: 303 mg/dL — ABNORMAL HIGH (ref 70–99)
Potassium: 5 mmol/L (ref 3.5–5.1)
Sodium: 134 mmol/L — ABNORMAL LOW (ref 135–145)
Total Bilirubin: 0.2 mg/dL — ABNORMAL LOW (ref 0.3–1.2)
Total Protein: 6.9 g/dL (ref 6.5–8.1)

## 2021-03-29 LAB — ACID FAST SMEAR (AFB, MYCOBACTERIA): Acid Fast Smear: NEGATIVE

## 2021-03-29 LAB — GLUCOSE, CAPILLARY
Glucose-Capillary: 173 mg/dL — ABNORMAL HIGH (ref 70–99)
Glucose-Capillary: 278 mg/dL — ABNORMAL HIGH (ref 70–99)
Glucose-Capillary: 295 mg/dL — ABNORMAL HIGH (ref 70–99)
Glucose-Capillary: 313 mg/dL — ABNORMAL HIGH (ref 70–99)
Glucose-Capillary: 317 mg/dL — ABNORMAL HIGH (ref 70–99)
Glucose-Capillary: 345 mg/dL — ABNORMAL HIGH (ref 70–99)

## 2021-03-29 LAB — MPO/PR-3 (ANCA) ANTIBODIES
ANCA Proteinase 3: <3.5 U/mL (ref 0.0–3.5)
Myeloperoxidase Abs: <9 U/mL (ref 0.0–9.0)

## 2021-03-29 LAB — FUNGITELL, SERUM: Fungitell Result: 108 pg/mL — ABNORMAL HIGH (ref <80)

## 2021-03-29 LAB — ANCA TITERS
Atypical P-ANCA titer: <1:20 {titer}
C-ANCA: <1:20 {titer}
P-ANCA: <1:20 {titer}

## 2021-03-29 LAB — VANCOMYCIN, TROUGH: Vancomycin Tr: 11 ug/mL — ABNORMAL LOW (ref 15–20)

## 2021-03-29 LAB — CYCLIC CITRUL PEPTIDE ANTIBODY, IGG/IGA: CCP Antibodies IgG/IgA: 11 units (ref 0–19)

## 2021-03-29 MED ORDER — INSULIN ASPART 100 UNIT/ML IJ SOLN
9.0000 [IU] | Freq: Three times a day (TID) | INTRAMUSCULAR | Status: DC
  Administered 2021-03-29 – 2021-04-03 (×13): 9 [IU] via SUBCUTANEOUS

## 2021-03-29 MED ORDER — INSULIN GLARGINE 100 UNIT/ML ~~LOC~~ SOLN
42.0000 [IU] | Freq: Every day | SUBCUTANEOUS | Status: DC
  Administered 2021-03-29 – 2021-03-30 (×2): 42 [IU] via SUBCUTANEOUS
  Filled 2021-03-29 (×2): qty 0.42

## 2021-03-29 NOTE — Progress Notes (Signed)
Cardiac MRI ordered, [facility] staff- Summer stated the earliest the MRI can be done is tomorrow- Thursday (6/16) at 8am, ordering provider Sandria Senter and patient's nurse-Sakara made aware.

## 2021-03-29 NOTE — Plan of Care (Signed)
  Problem: Activity: Goal: Risk for activity intolerance will decrease Outcome: Completed/Met

## 2021-03-29 NOTE — Progress Notes (Addendum)
Progress Note  Patient Name: Lynn [name] Date of Encounter: 03/29/2021  [facility] Cardiologist: Glenetta Hew, MD   Subjective   No chest pain, no SOB  Inpatient Medications    Scheduled Meds:  arformoterol  15 mcg Nebulization BID   atorvastatin  80 mg Oral q1800   diltiazem  60 mg Oral Q8H   fluticasone  1 spray Each Nare Daily   insulin aspart  0-20 Units Subcutaneous TID WC   insulin aspart  0-5 Units Subcutaneous QHS   insulin aspart  6 Units Subcutaneous TID WC   insulin glargine  35 Units Subcutaneous QHS   loratadine  10 mg Oral Daily   losartan  100 mg Oral Daily   methylPREDNISolone (SOLU-MEDROL) injection  40 mg Intravenous Q24H   multivitamin with minerals  1 tablet Oral Daily   revefenacin  175 mcg Nebulization Daily   saccharomyces boulardii  250 mg Oral BID   Continuous Infusions:  cefTRIAXone (ROCEPHIN)  IV 2 g (03/29/21 0907)   lactated ringers     vancomycin 1,000 mg (03/28/21 1650)   PRN Meds: acetaminophen **OR** acetaminophen, albuterol, guaiFENesin-dextromethorphan, HYDROcodone bit-homatropine, ondansetron (ZOFRAN) IV   Vital Signs    Vitals:   03/28/21 1732 03/28/21 2015 03/29/21 0420 03/29/21 0549  BP: (!) 142/63 (!) 146/65 (!) 106/56 114/69  Pulse: 83 79 63   Resp: 18     Temp: 97.8 F (36.6 C) 98.2 F (36.8 C) 97.7 F (36.5 C)   TempSrc: Oral Oral    SpO2: 93% 97% 99%   Weight:   109.4 kg   Height:        Intake/Output Summary (Last 24 hours) at 03/29/2021 0924 Last data filed at 03/28/2021 1446 Gross per 24 hour  Intake 300 ml  Output --  Net 300 ml   Last 3 Weights 03/29/2021 03/28/2021 03/27/2021  Weight (lbs) 241 lb 2.9 oz 238 lb 12.1 oz 234 lb 1.6 oz  Weight (kg) 109.4 kg 108.3 kg 106.187 kg      Telemetry    SR - Personally Reviewed  ECG    No new - Personally Reviewed  Physical Exam   GEN: No acute distress.   Neck: No JVD Cardiac: RRR, no murmurs, rubs, or gallops.  Respiratory: Clear to  auscultation bilaterally. GI: Soft, nontender, non-distended  MS: No edema; No deformity. Neuro:  Nonfocal  Psych: Normal affect   Labs    High Sensitivity Troponin:  No results for input(s): TROPONINIHS in the last 720 hours.    Chemistry Recent Labs  Lab 03/27/21 0351 03/27/21 1132 03/28/21 0333 03/29/21 0342  NA 133* 133* 133* 134*  K 4.7 4.3 4.9 5.0  CL 99 97* 99 102  CO2 27 25 28 27   GLUCOSE 393* 467* 379* 303*  BUN 19 22 25* 27*  CREATININE 1.23* 1.11* 1.16* 1.17*  CALCIUM 9.1 9.4 9.3 8.7*  PROT 7.5  --  7.6 6.9  ALBUMIN 3.1*  --  3.3* 3.1*  AST 18  --  18 21  ALT 18  --  22 25  ALKPHOS 68  --  84 68  BILITOT 0.6  --  0.2* 0.2*  GFRNONAA 48* 54* 52* 51*  ANIONGAP 7 11 6 5      Hematology Recent Labs  Lab 03/27/21 0351 03/27/21 1132 03/28/21 0333 03/29/21 0342  WBC 18.0*  --  30.2* 25.6*  RBC 4.28 4.49 4.33 4.03  HGB 8.0*  --  8.1* 7.6*  HCT 27.2*  --  27.6* 26.0*  MCV 63.6*  --  63.7* 64.5*  MCH 18.7*  --  18.7* 18.9*  MCHC 29.4*  --  29.3* 29.2*  RDW 18.5*  --  18.6* 18.6*  PLT 361  --  412* 370    BNP Recent Labs  Lab 03/26/21 1457  BNP 152.5*     DDimer  Recent Labs  Lab 03/26/21 1433 03/27/21 0351  DDIMER 2.23* 2.65*     Radiology    Cardiac Studies   03/28/21 TEE  IMPRESSIONS     1. Left ventricular ejection fraction, by estimation, is 55 to 60%. The  left ventricle has normal function. The left ventricle has no regional  wall motion abnormalities.   2. Right ventricular systolic function was not well visualized. The right  ventricular size is not well visualized.   3. No left atrial/left atrial appendage thrombus was detected.   4. 3-D images of the mitral annular mass were obtained. There is a large  circumscribed mass adherent to the posterior annulus. The posterior  leaflet is fixed . There is moderate - severe mitral stenosis.      The mass is large , measuring 1.8 x 1.4 cm. Differential diagnosis  includes myxoma,  exuberant mitral annular calcification, myxoma, or other  primary cardiac tumor. suggest Cardiac MRI for further evaluation . This  mass was present on transthoracic  echo from 2018. . The mitral valve is abnormal. Mild mitral valve  regurgitation. Moderate to severe mitral stenosis. Severe mitral annular  calcification.   5. The aortic valve is tricuspid. Aortic valve regurgitation is not  visualized. No aortic stenosis is present.   6. There is mild (Grade II) plaque.   FINDINGS   Left Ventricle: Left ventricular ejection fraction, by estimation, is 55  to 60%. The left ventricle has normal function. The left ventricle has no  regional wall motion abnormalities. The left ventricular internal cavity  size was normal in size.   Mitral Valve: 3-D images of the mitral annular mass were obtained. There  is a large circumscribed mass adherent to the posterior annulus. The  posterior leaflet is fixed . There is moderate - severe mitral stenosis.  The mass is large , measuring 1.8 x 1.4 cm. Differential diagnosis  includes myxoma, exuberant mitral annular calcification, myxoma, or other  primary cardiac tumor. suggest Cardiac MRI for further evaluation . This  mass was present on transthoracic echo  from 2018. The mitral valve is abnormal. Severe mitral annular  calcification. Mild mitral valve regurgitation. Moderate to severe mitral  valve stenosis. MV peak gradient, 19.9 mmHg. The mean mitral valve  gradient is 14.0 mmHg.   2D echo 03/2021    1. Left ventricular ejection fraction, by estimation, is 60 to 65%. The  left ventricle has normal function. The left ventricle has no regional  wall motion abnormalities. There is moderate left ventricular hypertrophy  of the basal-septal segment. Left  ventricular diastolic parameters are consistent with Grade II diastolic  dysfunction (pseudonormalization). Elevated left ventricular end-diastolic  pressure.   2. Right ventricular systolic  function is normal. The right ventricular  size is normal. There is mildly elevated pulmonary artery systolic  pressure. The estimated right ventricular systolic pressure is 67.6 mmHg.   3. Left atrial size was mildly dilated.   4. The mitral valve is abnormal. There is an ill defined density  measuring 3.23 x 2.10cm that appears to originate off the posterior MV  leaflet but cannot be definite on this.  There is also mitral annular  calcification present. There is moderate mitral  stenosis with a mean MVG of 53mmHg. No evidence of mitral valve  regurgitation.   5. The aortic valve is normal in structure. Aortic valve regurgitation is  not visualized. No aortic stenosis is present.   6. The inferior vena cava is normal in size with greater than 50%  respiratory variability, suggesting right atrial pressure of 3 mmHg.   7. Recommend TEE for possible mitral valve endocarditis in setting of  abnormal mitral valve and fevers.   LHC 2018 Moderate to heavy coronary calcification. Segmental 40-50% LAD narrowing. 30-50% proximal to mid RCA. Coronaries are widely patent without high-grade stenosis. Fluoroscopy demonstration of inferolateral calcified mass, likely representing mitral annular calcification. Consider CT or MRI if there is not heavy mitral valve calcification on echocardiography. Spontaneous runs of supraventricular tachycardia during the procedure.     RECOMMENDATIONS:   No significant obstructive coronary disease is identified. Etiology of enzyme elevation and clinical presentation could potentially be supraventricular arrhythmia superimposed on the cardiac substrate noted above.    Patient Profile     65 y.o. female with a hx of RA, CAD, presumed coronary vasospasm, mitral stenosis, anxiety, stomach ulcers, HTN, HLD, former tobacco use, microcytic anemia, OSA, probable CKD stage IIIa by labs, seizures, thalassemia, DM2, PAT now with possible endocarditis and episode of atrial  fib.    Assessment & Plan    1. Fever of unknown origin with acute respiratory failure and interstitial changes on lung imaging - 2D echo raised question of possible mitral valve endocarditis, described in an area previously known to have opacification by fluoroscopy in 2018 and prior echocardiogram -TEE yesterday with large mass adherent to posterior annulus the post. Leaflet is fixed, mod to severe MS.  Possible tumor cardiac MRI was recommended.    -Bronch with CCM done yesterday await path, micor and cytology.    2. Newly recognized atrial fibrillation - 3 hours on 03/26/21  maintaining SR - not on [place] prior to admission - CHADSVASc 5 but episode was very brief (not on ASA PTA - noet hx of stomach ulcers remotely, also question of dural AVF on MRI) for short episode af a fib will hold anticoagulation at this time, but continue to monitor - TSH wnl - continue diltiazem   3. H/o nonobstructive CAD/coronary vasospasm - recent OP troponin negative, no angina - long acting diltiazem was changed to short acting on arrival, consider consolidating back - previously on Imdur, listed as not taking PTA    4. History of diastolic dysfunction - not on diuretic PTA, no history of clinical heart failure by record - BNP generally low this admission - euvolemic      5. Moderate to severe mitral stenosis - plan for cardiac MRI then may need cath depending on mass -  See TEE results   Other medical issues include per IM - Microcytic anemia with h/o thalassemia  hgb 7.6  - Suspected CKD stage IIIa by historical creatinine - Abnormal brain MRI raising question of dural AV fistula - DM with elevated blood sugars       For questions or updates, please contact [facility] Please consult [email or web address] for contact info under        Signed, Cecilie Kicks, NP  03/29/2021, 9:66 AM    65 year old with mitral valve disease, fever of unknown origin. Transesophageal echocardiogram yesterday showed  what looks to be caseating mitral annular calcification.  Large globular mass on the  posterior leaflet region of the mitral valve.  Also has what appears to be severe mitral stenosis. - In order to further characterize this, a cardiac MRI has been ordered and will be done tomorrow morning at [facility]. -May need surgery for her mitral valve eventually.  With her recent fevers, timing is not optimal. -There was no evidence of endocarditis however.  Atrial fibrillation paroxysmal - Brief episode.  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  Lets continue to monitor.  Candee Furbish, MD

## 2021-03-29 NOTE — Plan of Care (Signed)
  Problem: Education: Goal: Knowledge of General Education information will improve Description: Including pain rating scale, medication(s)/side effects and non-pharmacologic comfort measures Outcome: Progressing   Problem: Clinical Measurements: Goal: Diagnostic test results will improve Outcome: Progressing Goal: Cardiovascular complication will be avoided Outcome: Progressing   Problem: Nutrition: Goal: Adequate nutrition will be maintained Outcome: Progressing   Problem: Coping: Goal: Level of anxiety will decrease Outcome: Progressing   

## 2021-03-29 NOTE — Progress Notes (Signed)
Inpatient Diabetes Program Recommendations  AACE/ADA: New Consensus Statement on Inpatient Glycemic Control (2015)  Target Ranges:  Prepandial:   less than 140 mg/dL      Peak postprandial:   less than 180 mg/dL (1-2 hours)      Critically ill patients:  140 - 180 mg/dL   Lab Results  Component Value Date   GLUCAP 278 (H) 03/29/2021   HGBA1C 8.0 (A) 01/30/2021    Review of Glycemic Control Results for Lynn [name], Lynn "PAT" (MRN [id number]) as of 03/29/2021 09:00  Ref. Range 03/28/2021 07:53 03/28/2021 11:31 03/28/2021 16:59 03/28/2021 20:17 03/29/2021 00:01 03/29/2021 04:22  Glucose-Capillary Latest Ref Range: 70 - 99 mg/dL 318 (H) 253 (H) 326 (H) 369 (H) 317 (H) 278 (H)     Diabetes history:  DM2 Outpatient Diabetes medications:  Omnipod/Dexcon CGM/Humalog Basal-2.5/hr (60 units QD) Insulin:Carb ration-1 unit/12 carbs Target-100-100 mg/dL Insulin sensitivity factor 20 (1 unit brings blood sugar down 20 points) Victoza 1.8 mg QD Current orders for Inpatient glycemic control:  Lantus 25 units QHS Novolog 0-20 units TID/0-5 units QHS Novolog 6 units TID Solumedrol 40 mg Q24H  Inpatient Diabetes Program Recommendations:    MD-Please consider increasing Lantus to home dose 48 units QD   Will continue to follow while inpatient.  Thanks, Lynn Headings RN, MSN, BC-ADM Inpatient Diabetes Coordinator Team Pager [phone] (8a-5p)

## 2021-03-29 NOTE — Progress Notes (Signed)
PROGRESS NOTE    Lynn [name]  KTG:[id number] DOB: 1955/10/31 DOA: 03/23/2021 PCP: [name], MD   Brief Narrative: 65 year old female with RA on methotrexate, diastolic heart failure, hypertension, hyperlipidemia, thalassemia type 2 diabetes admitted with fever of unknown origin. Patient reports having fever of 10 1-1 02 at home associated with some shortness of breath and nonproductive cough, she was started on doxycycline for possible bronchitis.  She continued to have fever in spite of antibiotics. Patient reports traveling to [place] for a funeral a month ago.  She has not had COVID but she has taken COVID-vaccine. CT of the chest abdomen and pelvis shows no evidence of acute or active infection. She is not on oxygen at home Procalcitonin is low, sed rate is 46 CRP is 2.8 She is a former smoker MRI of the brain with AV malformation  6/15 had TEE on bronchoscopy on 6/14  Assessment & Plan:   Principal Problem:   Acute respiratory failure with hypoxia (HCC) Active Problems:   Essential hypertension   Chronic diastolic CHF (congestive heart failure) (HCC)   Type 2 diabetes mellitus with hyperglycemia, with long-term current use of insulin (HCC)   Acute bronchitis   SOB (shortness of breath)   FUO (fever of unknown origin)   Hypokalemia   Generalized weakness   Acute bacterial endocarditis   # Acute hypoxic respiratory failure-of unclear etiology.  Patient has no history of asthma or COPD.  She is a former smoker.   CT chest no evidence of pulmonary embolism infiltrates atelectasis or pleural effusion noted. She has a normal procalcitonin  Crp trending up 9.5 from 2.8 Esr trending up 68 from 47  LDH 197  CPK 213 BNP 197 Ferritin 130 D-dimer 2.65 up from 0.55 WBC 14.6 from 8.6 prior to starting Solu-Medrol WBC still trending up to 30.2. Hemoglobin 8.1 from 9.3 TSH 4.01  Chest x-ray 03/25/2021-persistent patchy nodular interstitial process in the  lungs could be due to atypical/viral pneumonia or reactive airway disease/hypersensitivity pneumonitis/cryptogenic organizing pneumonia COVID-negative flu negative respiratory virus panel negative, hepatitis panel normal, cryptococcal antigen negative HIV nonreactive.. Azithromycin started 03/24/2021 Rocephin started 03/25/2021 for possible CAP .   Vancomycin started 03/25/2021 for possible mitral valve endocarditis.  Renal functions need to be monitored closely.  Creatinine clearance 57. Discussed with PCCM cardiology and ID   Nebulizer treatments  ADDING solumedrol 6/12 as patient diffusely wheezing and loud wheezing. Echocardiogram-action fraction 60 to 65%.  Abnormal density in the mitral valve. 6/15- s/p TEE and bronchoscopy on 6/14 TEE with large mass adherent to the posterior annulus.  Moderate to severe MS. S/p bronchoscopy-await path, cytology, micro.    #Fever of unknown origin work-up reveals possible mitral valve endocarditis with negative blood cultures, possible interstitial lung disease reactive airway disease or hypersensitivity pneumonitis.  Patient is on azithromycin Rocephin and vancomycin.  For TEE and bronchoscopy on 03/28/2021. 6/15-results above.  # cardiac Mass- found on TEE. Cardiology following Will obtain cardiac MRI for further evaluation   #History CAD nonobstructive/chronic diastolic heart failure -stable on hctz at home which has not been restarted .  Echo with ejection fraction 60 to 65%.  #History of essential hypertension on losartan and diltiazem.  Restarted   #type 2 dm -on insulin pump at home with Victoza.  Will increase SSI to resistant, Lantus 6 units nightly and 3 units of NovoLog 3 times a day prior to meals. CBG (last 3)  Recent Labs    03/29/21 0001 03/29/21 0422 03/29/21 1141  GLUCAP 317* 278* 313*  Will increase Lantus to 35 units nightly  #Chronic microcytic anemia/thalassemia Baseline hemoglobin 9-10 monitor daily.  Hemoglobin dropping  to 8 with no signs of active bleeding.  FOBT fordered.   # rule out mitral valve endocarditis blood cultures negative.  Trans thoracic echo with density in the mitral valve.   S/p TEE , see results.   # paroxysmal atrial tachycardia on Cardizem at home.  Patient went into a brief episode of A. fib RVR and rate of 110-1 20s given Cardizem.  She converted to sinus rhythm in less than 3 hours. 6/15-holding off on a/c at this time since short brief epidsode of afib.  Tsh nml Continue cardizem  #Possible sleep apnea with desaturations overnight sleep study as outpatient  Estimated body mass index is 41.4 kg/m as calculated from the following:   Height as of this encounter: 5' 4" (1.626 m).   Weight as of this encounter: 109.4 kg.  DVT prophylaxis: lovenox Code Status: full Family Communication: I asked patient if I should update her husband, she reported he is already updated Disposition Plan:  Status is: Inpatient  Remains inpatient appropriate because:Persistent severe electrolyte disturbances  Dispo: The patient is from: Home              Anticipated d/c is to: Home              Patient currently is not medically stable to d/c.   Difficult to place patient No   Consultants:, Cardiology, ID  Procedures: none Antimicrobials: none  Subjective: She feels fine today.  She just reports feeling sleepy since her procedures yesterday.  No shortness of breath.  Mild soreness of her throat.  No other acute complaints or chest pain.    Objective: Vitals:   03/28/21 2015 03/29/21 0420 03/29/21 0549 03/29/21 1208  BP: (!) 146/65 (!) 106/56 114/69 130/78  Pulse: 79 63  66  Resp:    18  Temp: 98.2 F (36.8 C) 97.7 F (36.5 C)  97.7 F (36.5 C)  TempSrc: Oral   Oral  SpO2: 97% 99%  97%  Weight:  109.4 kg    Height:       No intake or output data in the 24 hours ending 03/29/21 1559 Filed Weights   03/27/21 0353 03/28/21 0500 03/29/21 0420  Weight: 106.2 kg 108.3 kg 109.4 kg     Examination: Pleasant,, NAD, calm CTA no wheeze rales rhonchi's Regular S1-S2 no gallops Soft benign positive bowel sounds No edema Awake alert and oriented x4, grossly intact    Data Reviewed: I have personally reviewed following labs and imaging studies  CBC: Recent Labs  Lab 03/23/21 1301 03/26/21 0402 03/27/21 0351 03/28/21 0333 03/29/21 0342  WBC 8.6 14.6* 18.0* 30.2* 25.6*  NEUTROABS 6.0  --   --   --   --   HGB 9.3* 8.1* 8.0* 8.1* 7.6*  HCT 32.1* 27.3* 27.2* 27.6* 26.0*  MCV 63.6* 63.0* 63.6* 63.7* 64.5*  PLT 368 349 361 412* 027   Basic Metabolic Panel: Recent Labs  Lab 03/26/21 2303 03/27/21 0351 03/27/21 1132 03/28/21 0333 03/29/21 0342  NA 132* 133* 133* 133* 134*  K 4.5 4.7 4.3 4.9 5.0  CL 99 99 97* 99 102  CO2 _0 GLUCOSE 429* 393* 467* 379* 303*  BUN _1 25* 27*  CREATININE 1.30* 1.23* 1.11* 1.16* 1.17*  CALCIUM 9.0 9.1 9.4 9.3 8.7*   GFR: Estimated Creatinine Clearance:  56.4 mL/min (A) (by C-G formula based on SCr of 1.17 mg/dL (H)). Liver Function Tests: Recent Labs  Lab 03/23/21 1301 03/26/21 0402 03/27/21 0351 03/28/21 0333 03/29/21 0342  AST _0 ALT _1 ALKPHOS 69 62 68 84 68  BILITOT 0.4 0.7 0.6 0.2* 0.2*  PROT 7.4 7.1 7.5 7.6 6.9  ALBUMIN 3.5 3.2* 3.1* 3.3* 3.1*   No results for input(s): LIPASE, AMYLASE in the last 168 hours. No results for input(s): AMMONIA in the last 168 hours. Coagulation Profile: No results for input(s): INR, PROTIME in the last 168 hours. Cardiac Enzymes: Recent Labs  Lab 03/25/21 1126  CKTOTAL 213   BNP (last 3 results) Recent Labs    03/20/21 1220  PROBNP 57.0   HbA1C: No results for input(s): HGBA1C in the last 72 hours. CBG: Recent Labs  Lab 03/28/21 1659 03/28/21 2017 03/29/21 0001 03/29/21 0422 03/29/21 1141  GLUCAP 326* 369* 317* 278* 313*   Lipid Profile: No results for input(s): CHOL, HDL, LDLCALC, TRIG, CHOLHDL, LDLDIRECT in  the last 72 hours. Thyroid Function Tests: No results for input(s): TSH, T4TOTAL, FREET4, T3FREE, THYROIDAB in the last 72 hours. Anemia Panel: Recent Labs    03/27/21 1132  VITAMINB12 291  FOLATE 6.5  FERRITIN 194  TIBC 233*  IRON 38  RETICCTPCT 2.3   Sepsis Labs: Recent Labs  Lab 03/24/21 0225  PROCALCITON <0.10    Recent Results (from the past 240 hour(s))  Resp Panel by RT-PCR (Flu A&B, Covid) Nasopharyngeal Swab     Status: None   Collection Time: 03/23/21  5:32 PM   Specimen: Nasopharyngeal Swab; Nasopharyngeal(NP) swabs in vial transport medium  Result Value Ref Range Status   SARS Coronavirus 2 by RT PCR NEGATIVE NEGATIVE Final    Comment: (NOTE) SARS-CoV-2 target nucleic acids are NOT DETECTED.  The SARS-CoV-2 RNA is generally detectable in upper respiratory specimens during the acute phase of infection. The lowest concentration of SARS-CoV-2 viral copies this assay can detect is 138 copies/mL. A negative result does not preclude SARS-Cov-2 infection and should not be used as the sole basis for treatment or other patient management decisions. A negative result may occur with  improper specimen collection/handling, submission of specimen other than nasopharyngeal swab, presence of viral mutation(s) within the areas targeted by this assay, and inadequate number of viral copies(<138 copies/mL). A negative result must be combined with clinical observations, patient history, and epidemiological information. The expected result is Negative.  Fact Sheet for Patients:  EntrepreneurPulse.com.au  Fact Sheet for Healthcare Providers:  IncredibleEmployment.be  This test is no t yet approved or cleared by the [place] FDA and  has been authorized for detection and/or diagnosis of SARS-CoV-2 by FDA under an Emergency Use Authorization (EUA). This EUA will remain  in effect (meaning this test can be used) for the duration of  the COVID-19 declaration under Section 564(b)(1) of the Act, 21 U.S.C.section 360bbb-3(b)(1), unless the authorization is terminated  or revoked sooner.       Influenza A by PCR NEGATIVE NEGATIVE Final   Influenza B by PCR NEGATIVE NEGATIVE Final    Comment: (NOTE) The Xpert Xpress SARS-CoV-2/FLU/RSV plus assay is intended as an aid in the diagnosis of influenza from Nasopharyngeal swab specimens and should not be used as a sole basis for treatment. Nasal washings and aspirates are unacceptable for Xpert Xpress SARS-CoV-2/FLU/RSV testing.  Fact Sheet for Patients: EntrepreneurPulse.com.au  Fact Sheet for Healthcare Providers: IncredibleEmployment.be  This test is not yet approved or cleared by the [place] and has been authorized for detection and/or diagnosis of SARS-CoV-2 by FDA under an Emergency Use Authorization (EUA). This EUA will remain in effect (meaning this test can be used) for the duration of the COVID-19 declaration under Section 564(b)(1) of the Act, 21 U.S.C. section 360bbb-3(b)(1), unless the authorization is terminated or revoked.  Performed at [facility], [place] [address], [place], [place] [address]   Urine culture     Status: Abnormal   Collection Time: 03/23/21  5:32 PM   Specimen: Urine, Clean Catch  Result Value Ref Range Status   Specimen Description   Final    URINE, CLEAN CATCH Performed at [facility], [place] [address], [place], [place] [address]    Special Requests   Final    NONE Performed at [facility], [place] [address] [place], [place] [address]    Culture (A)  Final    <10,000 COLONIES/mL INSIGNIFICANT GROWTH Performed at [place] [address], [place], [place] [address]    Report Status 03/25/2021 FINAL  Final  Culture, blood (Routine X 2) w Reflex to ID Panel     Status: None   Collection Time: 03/24/21  2:25 AM    Specimen: BLOOD  Result Value Ref Range Status   Specimen Description   Final    BLOOD BLOOD LEFT FOREARM Performed at [place] [address], [place], [place] [address]    Special Requests   Final    BOTTLES DRAWN AEROBIC AND ANAEROBIC Blood Culture adequate volume Performed at [place] [address], [place], [place] [address]    Culture   Final    NO GROWTH 5 DAYS Performed at [facility], [place] [address] [place], [place] [address]    Report Status 03/29/2021 FINAL  Final  Culture, blood (Routine X 2) w Reflex to ID Panel     Status: None   Collection Time: 03/24/21  2:30 AM   Specimen: BLOOD  Result Value Ref Range Status   Specimen Description   Final    BLOOD BLOOD RIGHT HAND Performed at [place] [address] [place], [place] [address]    Special Requests   Final    BOTTLES DRAWN AEROBIC AND ANAEROBIC Blood Culture adequate volume Performed at [place] [address] [place], [place] [address]    Culture   Final    NO GROWTH 5 DAYS Performed at [facility], [place] [address], [place], [place] [address]    Report Status 03/29/2021 FINAL  Final  Culture, blood (routine x 2)     Status: None   Collection Time: 03/24/21 11:30 AM   Specimen: BLOOD  Result Value Ref Range Status   Specimen Description   Final    BLOOD LEFT ANTECUBITAL Performed at [facility], [place] [address] [place], [place] [address]    Special Requests   Final    BOTTLES DRAWN AEROBIC AND ANAEROBIC Blood Culture adequate volume Performed at [place] [address], [place], [place] [address]    Culture   Final    NO GROWTH 5 DAYS Performed at [facility], [place] [address] [place], [place] [address]    Report Status 03/29/2021 FINAL  Final  MRSA PCR Screening     Status: None   Collection Time: 03/25/21  1:27 PM   Specimen: Nasal Mucosa;  Nasopharyngeal  Result Value Ref Range Status   MRSA by PCR NEGATIVE NEGATIVE Final    Comment:        The GeneXpert MRSA Assay (FDA approved for NASAL specimens only), is one component of a comprehensive MRSA colonization surveillance program. It is not intended to diagnose MRSA infection nor to guide or monitor treatment for MRSA infections. Performed at [facility], [place] [address] [place], [place] [address]   Respiratory (~20 pathogens) panel by PCR     Status: None   Collection Time: 03/26/21  7:50 AM  Result Value Ref Range Status   Adenovirus NOT DETECTED NOT DETECTED Final   Coronavirus 229E NOT DETECTED NOT DETECTED Final    Comment: (NOTE) The Coronavirus on the Respiratory Panel, DOES NOT test for the novel  Coronavirus (2019 nCoV)    Coronavirus HKU1 NOT DETECTED NOT DETECTED Final   Coronavirus NL63 NOT DETECTED NOT DETECTED Final   Coronavirus OC43 NOT DETECTED NOT DETECTED Final   Metapneumovirus NOT DETECTED NOT DETECTED Final   Rhinovirus / Enterovirus NOT DETECTED NOT DETECTED Final   Influenza A NOT DETECTED NOT DETECTED Final   Influenza B NOT DETECTED NOT DETECTED Final   Parainfluenza Virus 1 NOT DETECTED NOT DETECTED Final   Parainfluenza Virus 2 NOT DETECTED NOT DETECTED Final   Parainfluenza Virus 3 NOT DETECTED NOT DETECTED Final   Parainfluenza Virus 4 NOT DETECTED NOT DETECTED Final   Respiratory Syncytial Virus NOT DETECTED NOT DETECTED Final   Bordetella pertussis NOT DETECTED NOT DETECTED Final   Bordetella Parapertussis NOT DETECTED NOT DETECTED Final   Chlamydophila pneumoniae NOT DETECTED NOT DETECTED Final   Mycoplasma pneumoniae NOT DETECTED NOT DETECTED Final    Comment: Performed at [facility], [place]. [address], [place], [place] [address]  Culture, Respiratory w Gram Stain     Status: None (Preliminary result)   Collection Time: 03/28/21  2:10 PM   Specimen: Bronchoalveolar Lavage; Respiratory  Result Value  Ref Range Status   Specimen Description BRONCHIAL ALVEOLAR LAVAGE  Final   Special Requests NONE  Final   Gram Stain   Final    FEW WBC PRESENT, PREDOMINANTLY MONONUCLEAR NO ORGANISMS SEEN    Culture   Final    NO GROWTH < 24 HOURS Performed at [facility], [address], [place], [place] [address]    Report Status PENDING  Incomplete          Radiology Studies: ECHO TEE  Result Date: 03/28/2021    TRANSESOPHOGEAL ECHO REPORT   Patient Name:   KINDRED HEYING Date of Exam: 03/28/2021 Medical Rec #:  [id number]               Height:       64.0 in Accession #:    [id number]              Weight:       238.8 lb Date of Birth:  1956/09/14                BSA:          2.109 m Patient Age:    32 years                BP:           118/55 mmHg Patient Gender: F  HR:           94 bpm. Exam Location:  Inpatient Procedure: 3D Echo, Transesophageal Echo, Cardiac Doppler and Color Doppler Indications:     I34.8 Other nonrheumatic mitral valve disorders  History:         Patient has prior history of Echocardiogram examinations, most                  recent 03/24/2021. CAD, Signs/Symptoms:Murmur and Syncope; Risk                  Factors:Hypertension, Diabetes, Dyslipidemia and Sleep Apnea.  Sonographer:     Jonelle Sidle Dance Referring Phys:  [id number] Diagnosing Phys: Mertie Moores MD PROCEDURE: The transesophogeal probe was passed without difficulty through the esophogus of the patient. Sedation performed by different physician. The patient was monitored while under deep sedation. Anesthestetic sedation was provided intravenously by Anesthesiology: 155m of Propofol, 630mof Lidocaine. Patients was under conscious sedation during this procedure. Anesthetic administered: 5062mof Fentanyl. The patient developed no complications during the procedure. IMPRESSIONS  1. Left ventricular ejection fraction, by estimation, is 55 to 60%. The left ventricle has normal function. The  left ventricle has no regional wall motion abnormalities.  2. Right ventricular systolic function was not well visualized. The right ventricular size is not well visualized.  3. No left atrial/left atrial appendage thrombus was detected.  4. 3-D images of the mitral annular mass were obtained. There is a large circumscribed mass adherent to the posterior annulus. The posterior leaflet is fixed . There is moderate - severe mitral stenosis.     The mass is large , measuring 1.8 x 1.4 cm. Differential diagnosis includes myxoma, exuberant mitral annular calcification, myxoma, or other primary cardiac tumor. suggest Cardiac MRI for further evaluation . This mass was present on transthoracic echo from 2018. . The mitral valve is abnormal. Mild mitral valve regurgitation. Moderate to severe mitral stenosis. Severe mitral annular calcification.  5. The aortic valve is tricuspid. Aortic valve regurgitation is not visualized. No aortic stenosis is present.  6. There is mild (Grade II) plaque. FINDINGS  Left Ventricle: Left ventricular ejection fraction, by estimation, is 55 to 60%. The left ventricle has normal function. The left ventricle has no regional wall motion abnormalities. The left ventricular internal cavity size was normal in size. Right Ventricle: The right ventricular size is not well visualized. Right vetricular wall thickness was not well visualized. Right ventricular systolic function was not well visualized. Left Atrium: Left atrial size was normal in size. No left atrial/left atrial appendage thrombus was detected. Right Atrium: Right atrial size was normal in size. Pericardium: There is no evidence of pericardial effusion. Mitral Valve: 3-D images of the mitral annular mass were obtained. There is a large circumscribed mass adherent to the posterior annulus. The posterior leaflet is fixed . There is moderate - severe mitral stenosis. The mass is large , measuring 1.8 x 1.4 cm. Differential diagnosis  includes myxoma, exuberant mitral annular calcification, myxoma, or other primary cardiac tumor. suggest Cardiac MRI for further evaluation . This mass was present on transthoracic echo from 2018. The mitral valve is abnormal. Severe mitral annular calcification. Mild mitral valve regurgitation. Moderate to severe mitral valve stenosis. MV peak gradient, 19.9 mmHg. The mean mitral valve gradient is 14.0 mmHg. Tricuspid Valve: The tricuspid valve is grossly normal. Tricuspid valve regurgitation is mild. Aortic Valve: The aortic valve is tricuspid. Aortic valve regurgitation is not visualized. No aortic  stenosis is present. Pulmonic Valve: The pulmonic valve was grossly normal. Pulmonic valve regurgitation is not visualized. Aorta: The aortic root and ascending aorta are structurally normal, with no evidence of dilitation. There is mild (Grade II) plaque. IAS/Shunts: The atrial septum is grossly normal.  MITRAL VALVE MV Peak grad: 19.9 mmHg MV Mean grad: 14.0 mmHg MV Vmax:      2.23 m/s MV Vmean:     178.0 cm/s Mertie Moores MD Electronically signed by Mertie Moores MD Signature Date/Time: 03/28/2021/4:14:10 PM    Final         Scheduled Meds:  arformoterol  15 mcg Nebulization BID   atorvastatin  80 mg Oral q1800   diltiazem  60 mg Oral Q8H   fluticasone  1 spray Each Nare Daily   insulin aspart  0-20 Units Subcutaneous TID WC   insulin aspart  0-5 Units Subcutaneous QHS   insulin aspart  6 Units Subcutaneous TID WC   insulin glargine  35 Units Subcutaneous QHS   loratadine  10 mg Oral Daily   losartan  100 mg Oral Daily   methylPREDNISolone (SOLU-MEDROL) injection  40 mg Intravenous Q24H   multivitamin with minerals  1 tablet Oral Daily   revefenacin  175 mcg Nebulization Daily   saccharomyces boulardii  250 mg Oral BID   Continuous Infusions:  cefTRIAXone (ROCEPHIN)  IV 2 g (03/29/21 0907)   lactated ringers       LOS: 6 days    Time spent: 35 minutes with more than 50% on [place], MD 03/29/2021, 3:59 PM

## 2021-03-29 NOTE — Progress Notes (Signed)
[facility]    Date of Admission:  03/23/2021   Total days of antibiotics 7/day 5 ceftriaxone           ID: Lynn [name] is a 65 y.o. female with   Principal Problem:   Acute respiratory failure with hypoxia ([place]) Active Problems:   Essential hypertension   Chronic diastolic CHF (congestive heart failure) (HCC)   Type 2 diabetes mellitus with hyperglycemia, with long-term current use of insulin (HCC)   Acute bronchitis   SOB (shortness of breath)   FUO (fever of unknown origin)   Hypokalemia   Generalized weakness   Acute bacterial endocarditis    Subjective: Afebrile, oxygen supplementation titrated to 7L. Underwent TEE and bronch yesterday. Some sore throat last night that is now improved.  Whitish secretions  noted on bronch. Specimens sent for culture  Not coughing  TEE showed: The mass is large , measuring 1.8 x 1.4 cm. Differential diagnosis includes myxoma, exuberant mitral annular calcification, myxoma, or other primary cardiac tumor. suggest Cardiac MRI for further evaluation . This mass was present on transthoracic echo from 2018.   Medications:   arformoterol  15 mcg Nebulization BID   atorvastatin  80 mg Oral q1800   diltiazem  60 mg Oral Q8H   fluticasone  1 spray Each Nare Daily   insulin aspart  0-20 Units Subcutaneous TID WC   insulin aspart  0-5 Units Subcutaneous QHS   insulin aspart  9 Units Subcutaneous TID WC   insulin glargine  42 Units Subcutaneous QHS   loratadine  10 mg Oral Daily   losartan  100 mg Oral Daily   methylPREDNISolone (SOLU-MEDROL) injection  40 mg Intravenous Q24H   multivitamin with minerals  1 tablet Oral Daily   revefenacin  175 mcg Nebulization Daily   saccharomyces boulardii  250 mg Oral BID    Objective: Vital signs in last 24 hours: Temp:  [97.7 F (36.5 C)-98.2 F (36.8 C)] 97.7 F (36.5 C) (06/15 1208) Pulse Rate:  [63-83] 66 (06/15 1208) Resp:  [18] 18 (06/15 1208) BP:  (106-146)/(56-78) 130/78 (06/15 1208) SpO2:  [93 %-99 %] 97 % (06/15 1208) Weight:  [109.4 kg] 109.4 kg (06/15 0420) Physical Exam  Constitutional:  oriented to person, place, and time. appears well-developed and well-nourished. No distress.  HENT: /AT, PERRLA, no scleral icterus Mouth/Throat: Oropharynx is clear and moist. No oropharyngeal exudate.  Cardiovascular: Normal rate, regular rhythm and normal heart sounds. Exam reveals no gallop and no friction rub.  No murmur heard.  Pulmonary/Chest: Effort normal and breath sounds normal. No respiratory distress.  has no wheezes.  Neck = supple, no nuchal rigidity Abdominal: Soft. Bowel sounds are normal.  exhibits no distension. There is no tenderness.  Lymphadenopathy: no cervical adenopathy. No axillary adenopathy Neurological: alert and oriented to person, place, and time.  Skin: Skin is warm and dry. No rash noted. No erythema.  Psychiatric: a normal mood and affect.  behavior is normal.     Lab Results Recent Labs    03/28/21 0333 03/29/21 0342  WBC 30.2* 25.6*  HGB 8.1* 7.6*  HCT 27.6* 26.0*  NA 133* 134*  K 4.9 5.0  CL 99 102  CO2 28 27  BUN 25* 27*  CREATININE 1.16* 1.17*   Liver Panel Recent Labs    03/28/21 0333 03/29/21 0342  PROT 7.6 6.9  ALBUMIN 3.3* 3.1*  AST 18 21  ALT 22 25  ALKPHOS 84 68  BILITOT 0.2* 0.2*   Sedimentation Rate No results for input(s): ESRSEDRATE in the last 72 hours. C-Reactive Protein No results for input(s): CRP in the last 72 hours.  Microbiology: reviewed Studies/Results: ECHO TEE  Result Date: 03/28/2021    TRANSESOPHOGEAL ECHO REPORT   Patient Name:   Lynn [name] Date of Exam: 03/28/2021 Medical Rec #:  [id number]               Height:       64.0 in Accession #:    [id number]              Weight:       238.8 lb Date of Birth:  September 26, 1956                BSA:          2.109 m Patient Age:    36 years                BP:           118/55 mmHg Patient Gender: F                        HR:           94 bpm. Exam Location:  Inpatient Procedure: 3D Echo, Transesophageal Echo, Cardiac Doppler and Color Doppler Indications:     I34.8 Other nonrheumatic mitral valve disorders  History:         Patient has prior history of Echocardiogram examinations, most                  recent 03/24/2021. CAD, Signs/Symptoms:Murmur and Syncope; Risk                  Factors:Hypertension, Diabetes, Dyslipidemia and Sleep Apnea.  Sonographer:     Jonelle Sidle Dance Referring Phys:  [id number] Q [id number] Diagnosing Phys: Mertie Moores MD PROCEDURE: The transesophogeal probe was passed without difficulty through the esophogus of the patient. Sedation performed by different physician. The patient was monitored while under deep sedation. Anesthestetic sedation was provided intravenously by Anesthesiology: 163m of Propofol, 655mof Lidocaine. Patients was under conscious sedation during this procedure. Anesthetic administered: 5058mof Fentanyl. The patient developed no complications during the procedure. IMPRESSIONS  1. Left ventricular ejection fraction, by estimation, is 55 to 60%. The left ventricle has normal function. The left ventricle has no regional wall motion abnormalities.  2. Right ventricular systolic function was not well visualized. The right ventricular size is not well visualized.  3. No left atrial/left atrial appendage thrombus was detected.  4. 3-D images of the mitral annular mass were obtained. There is a large circumscribed mass adherent to the posterior annulus. The posterior leaflet is fixed . There is moderate - severe mitral stenosis.     The mass is large , measuring 1.8 x 1.4 cm. Differential diagnosis includes myxoma, exuberant mitral annular calcification, myxoma, or other primary cardiac tumor. suggest Cardiac MRI for further evaluation . This mass was present on transthoracic echo from 2018. . The mitral valve is abnormal. Mild mitral valve regurgitation. Moderate to severe mitral  stenosis. Severe mitral annular calcification.  5. The aortic valve is tricuspid. Aortic valve regurgitation is not visualized. No aortic stenosis is present.  6. There is mild (Grade II) plaque. FINDINGS  Left Ventricle: Left ventricular ejection fraction, by estimation, is 55 to 60%. The left ventricle has normal function. The left ventricle has no regional wall motion  abnormalities. The left ventricular internal cavity size was normal in size. Right Ventricle: The right ventricular size is not well visualized. Right vetricular wall thickness was not well visualized. Right ventricular systolic function was not well visualized. Left Atrium: Left atrial size was normal in size. No left atrial/left atrial appendage thrombus was detected. Right Atrium: Right atrial size was normal in size. Pericardium: There is no evidence of pericardial effusion. Mitral Valve: 3-D images of the mitral annular mass were obtained. There is a large circumscribed mass adherent to the posterior annulus. The posterior leaflet is fixed . There is moderate - severe mitral stenosis. The mass is large , measuring 1.8 x 1.4 cm. Differential diagnosis includes myxoma, exuberant mitral annular calcification, myxoma, or other primary cardiac tumor. suggest Cardiac MRI for further evaluation . This mass was present on transthoracic echo from 2018. The mitral valve is abnormal. Severe mitral annular calcification. Mild mitral valve regurgitation. Moderate to severe mitral valve stenosis. MV peak gradient, 19.9 mmHg. The mean mitral valve gradient is 14.0 mmHg. Tricuspid Valve: The tricuspid valve is grossly normal. Tricuspid valve regurgitation is mild. Aortic Valve: The aortic valve is tricuspid. Aortic valve regurgitation is not visualized. No aortic stenosis is present. Pulmonic Valve: The pulmonic valve was grossly normal. Pulmonic valve regurgitation is not visualized. Aorta: The aortic root and ascending aorta are structurally normal, with no  evidence of dilitation. There is mild (Grade II) plaque. IAS/Shunts: The atrial septum is grossly normal.  MITRAL VALVE MV Peak grad: 19.9 mmHg MV Mean grad: 14.0 mmHg MV Vmax:      2.23 m/s MV Vmean:     178.0 cm/s Mertie Moores MD Electronically signed by Mertie Moores MD Signature Date/Time: 03/28/2021/4:14:10 PM    Final      Assessment/Plan: FUO = etiology is still perplexing. Agree with cardiac mri. Can do short course of 7 days of IV vanco and ceftriaxone. May discuss with pulmonary to start treatment for pjp with bactrim instead of vancomycin, while studies return  Recommend upep/spep as well as part of FUO work up  Mv cardiac lesoin suspected it was MAC in previous echo in 2018, appears unchanged. Unlikely to be endocarditis     [facility] Cell: [phone] Pager: [phone]  03/29/2021, 5:28 PM

## 2021-03-30 ENCOUNTER — Ambulatory Visit (HOSPITAL_COMMUNITY)
Admit: 2021-03-30 | Discharge: 2021-03-30 | Disposition: A | Discharge status: Home / Self Care | Payer: Medicare Other | Attending: Cardiology | Admitting: Cardiology

## 2021-03-30 ENCOUNTER — Encounter (HOSPITAL_COMMUNITY): Payer: Self-pay | Admitting: Cardiovascular Disease

## 2021-03-30 ENCOUNTER — Inpatient Hospital Stay (HOSPITAL_COMMUNITY): Payer: Medicare Other

## 2021-03-30 DIAGNOSIS — I5032 Chronic diastolic (congestive) heart failure: Secondary | ICD-10-CM

## 2021-03-30 DIAGNOSIS — J849 Interstitial pulmonary disease, unspecified: Secondary | ICD-10-CM

## 2021-03-30 DIAGNOSIS — J9601 Acute respiratory failure with hypoxia: Secondary | ICD-10-CM | POA: Diagnosis not present

## 2021-03-30 HISTORY — PX: OTHER SURGICAL HISTORY: SHX169

## 2021-03-30 LAB — QUANTIFERON-TB GOLD PLUS (RQFGPL)
QuantiFERON Mitogen Value: 1.44 IU/mL
QuantiFERON Nil Value: 0.01 IU/mL
QuantiFERON TB1 Ag Value: 0 IU/mL
QuantiFERON TB2 Ag Value: 0.01 IU/mL

## 2021-03-30 LAB — CRYOGLOBULIN

## 2021-03-30 LAB — QUANTIFERON-TB GOLD PLUS: QuantiFERON-TB Gold Plus: NEGATIVE

## 2021-03-30 LAB — GLUCOSE, CAPILLARY
Glucose-Capillary: 175 mg/dL — ABNORMAL HIGH (ref 70–99)
Glucose-Capillary: 152 mg/dL — ABNORMAL HIGH (ref 70–99)
Glucose-Capillary: 121 mg/dL — ABNORMAL HIGH (ref 70–99)
Glucose-Capillary: 204 mg/dL — ABNORMAL HIGH (ref 70–99)
Glucose-Capillary: 330 mg/dL — ABNORMAL HIGH (ref 70–99)
Glucose-Capillary: 302 mg/dL — ABNORMAL HIGH (ref 70–99)

## 2021-03-30 LAB — ANTINUCLEAR ANTIBODIES, IFA: ANA Ab, IFA: NEGATIVE

## 2021-03-30 LAB — METHYLMALONIC ACID, SERUM: Methylmalonic Acid, Quantitative: 212 nmol/L (ref 0–378)

## 2021-03-30 LAB — POTASSIUM: Potassium: 4.3 mmol/L (ref 3.5–5.1)

## 2021-03-30 MED ORDER — GADOBENATE DIMEGLUMINE 529 MG/ML IV SOLN
10.0000 mL | Freq: Once | INTRAVENOUS | Status: AC | PRN
  Administered 2021-03-30: 10 mL via INTRAVENOUS

## 2021-03-30 MED ORDER — SODIUM CHLORIDE 0.9 % IV SOLN
2.0000 g | INTRAVENOUS | Status: DC
  Administered 2021-03-30 – 2021-03-31 (×2): 2 g via INTRAVENOUS
  Filled 2021-03-30: qty 2
  Filled 2021-03-30: qty 20

## 2021-03-30 MED ORDER — PANTOPRAZOLE SODIUM 40 MG PO TBEC
40.0000 mg | DELAYED_RELEASE_TABLET | Freq: Every day | ORAL | Status: DC
  Administered 2021-03-30 – 2021-03-31 (×2): 40 mg via ORAL
  Filled 2021-03-30 (×2): qty 1

## 2021-03-30 MED ORDER — GADOBUTROL 1 MMOL/ML IV SOLN
10.0000 mL | Freq: Once | INTRAVENOUS | Status: AC | PRN
  Administered 2021-03-30: 10 mL via INTRAVENOUS

## 2021-03-30 MED ORDER — SULFAMETHOXAZOLE-TRIMETHOPRIM 800-160 MG PO TABS
1.0000 | ORAL_TABLET | Freq: Two times a day (BID) | ORAL | Status: DC
  Administered 2021-03-30 – 2021-03-31 (×3): 1 via ORAL
  Filled 2021-03-30 (×3): qty 1

## 2021-03-30 NOTE — Plan of Care (Signed)
  Problem: Education: Goal: Knowledge of General Education information will improve Description: Including pain rating scale, medication(s)/side effects and non-pharmacologic comfort measures Outcome: Progressing   Problem: Clinical Measurements: Goal: Ability to maintain clinical measurements within normal limits will improve Outcome: Progressing Goal: Will remain free from infection Outcome: Progressing Goal: Diagnostic test results will improve Outcome: Progressing Goal: Cardiovascular complication will be avoided Outcome: Progressing   Problem: Nutrition: Goal: Adequate nutrition will be maintained Outcome: Progressing   Problem: Coping: Goal: Level of anxiety will decrease Outcome: Progressing   Problem: Elimination: Goal: Will not experience complications related to bowel motility Outcome: Progressing Goal: Will not experience complications related to urinary retention Outcome: Progressing   Problem: Safety: Goal: Ability to remain free from injury will improve Outcome: Progressing   Problem: Skin Integrity: Goal: Risk for impaired skin integrity will decrease Outcome: Progressing

## 2021-03-30 NOTE — Plan of Care (Signed)
  Problem: Education: Goal: Knowledge of General Education information will improve Description: Including pain rating scale, medication(s)/side effects and non-pharmacologic comfort measures Outcome: Progressing   Problem: Clinical Measurements: Goal: Will remain free from infection Outcome: Progressing   Problem: Nutrition: Goal: Adequate nutrition will be maintained Outcome: Completed/Met

## 2021-03-30 NOTE — Care Management Important Message (Signed)
Important Message  Patient Details IM Letter given to the Patient. Name: Lynn [name] MRN: [id number] Date of Birth: 08/15/1956   Medicare Important Message Given:  Yes     Kerin Salen 03/30/2021, 10:13 AM

## 2021-03-30 NOTE — Progress Notes (Signed)
PT Cancellation Note  Patient Details Name: Lynn [name] MRN: [id number] DOB: 05-26-1956   Cancelled Treatment:    Reason Eval/Treat Not Completed: Patient at procedure or test/unavailable (pt at MRI. Will follow.)   Philomena Doheny PT 03/30/2021  Acute Rehabilitation Services Pager [phone] Office [phone]

## 2021-03-30 NOTE — Progress Notes (Signed)
Patient ID: Lynn [name], female   DOB: 27-May-1956, 65 y.o.   MRN: [id number]  PROGRESS NOTE    Joana Nolton  VOZ:[id number] DOB: [birth year] DOA: 03/23/2021 PCP: [name], MD   Brief Narrative: 65 year old female with RA on methotrexate, diastolic heart failure, hypertension, hyperlipidemia, thalassemia type 2 diabetes admitted with fever of unknown origin. Patient reports having fever of 10 1-1 02 at home associated with some shortness of breath and nonproductive cough, she was started on doxycycline for possible bronchitis.  She continued to have fever in spite of antibiotics. Patient reports traveling to [place] for a funeral a month ago.  She has not had COVID but she has taken COVID-vaccine. CT of the chest abdomen and pelvis shows no evidence of acute or active infection. She is not on oxygen at home Procalcitonin is low, sed rate is 46 CRP is 2.8 She is a former smoker MRI of the brain with AV malformation  6/15 had TEE on bronchoscopy on 6/14 6/16-Pt has been off the floor early , had MRI cardiac and CT today.   Assessment & Plan:   Principal Problem:   Acute respiratory failure with hypoxia (HCC) Active Problems:   Essential hypertension   Chronic diastolic CHF (congestive heart failure) (HCC)   Type 2 diabetes mellitus with hyperglycemia, with long-term current use of insulin (HCC)   Acute bronchitis   SOB (shortness of breath)   FUO (fever of unknown origin)   Hypokalemia   Generalized weakness   Acute bacterial endocarditis   ILD (interstitial lung disease) ([place])   # Acute hypoxic respiratory failure-of unclear etiology.  Patient has no history of asthma or COPD.  She is a former smoker.   CT chest no evidence of pulmonary embolism infiltrates atelectasis or pleural effusion noted. She has a normal procalcitonin  Crp trending up 9.5 from 2.8 Esr trending up 68 from 47  LDH 197  CPK 213 BNP 197 Ferritin 130 D-dimer 2.65 up from  0.55 WBC 14.6 from 8.6 prior to starting Solu-Medrol Now trending down Hemoglobin 8.1 from 9.3 TSH 4.01  Chest x-ray 03/25/2021-persistent patchy nodular interstitial process in the lungs could be due to atypical/viral pneumonia or reactive airway disease/hypersensitivity pneumonitis/cryptogenic organizing pneumonia COVID-negative flu negative respiratory virus panel negative, hepatitis panel normal, cryptococcal antigen negative HIV nonreactive.. Azithromycin started 03/24/2021 Rocephin started 03/25/2021 for possible CAP .   Vancomycin started 03/25/2021 for possible mitral valve endocarditis.  Renal functions need to be monitored closely.  Creatinine clearance 57. Discussed with PCCM cardiology and ID   Nebulizer treatments  ADDING solumedrol 6/12 as patient diffusely wheezing and loud wheezing. Echocardiogram-action fraction 60 to 65%.  Abnormal density in the mitral valve. s/p TEE and bronchoscopy on 6/14 TEE with large mass adherent to the posterior annulus.  Moderate to severe MS. S/p bronchoscopy-await path, cytology, micro.  6/16-CTA completed today, see results. PCCM following-predominant lymphocytosis on BAL ..>typically consistent with MTX hypersensitive pneumonitis or feather pillow hypers. Pneumonitis or combo. Rarely can be L IP from Rheumatoid.  Comtinue iv steroid Advised to get rid of feather pillow No more MTX   #Fever of unknown origin work-up? possible mitral valve endocarditis with negative blood cultures, possible interstitial lung disease reactive airway disease or hypersensitivity pneumonitis.   ETIOLOGY still Unknow Patient is on azithromycin Rocephin and vancomycin.  For TEE and bronchoscopy on 03/28/2021. 6/16- ID following-based on TEE, unlikely endocarditis. TEE with large mass , see report Cardiac MRI  done today F/u cytology and micro.  Per ID do short course of 7 day iv vanco and ceftriaxone.  Rec. UPEP /SPEP as part of w/u.    # cardiac Mass- on  TEE. Cardiology following  Cardiac MRI completed today-Posterolateral mitral annulus mass consistent with degenerative mitral annular calcification Will f/u with cards input  #History CAD nonobstructive/chronic diastolic heart failure - Stable, no angina. On short acting cardizem, will need to change back to long acting. Continue statin. Echo with nml EF   #History of essential hypertension  Continue losartan and diltiazem   #type 2 dm -on insulin pump at home with Victoza.   BG overall stable, monitor on steroids Continue Lantus and RISS.  Continue novolog 9 units with meals tid   #Chronic microcytic anemia/thalassemia Baseline hemoglobin 9-10 monitor daily.   Hg 7.6 Monitor . No sx/s of active bleed. FOBT ordered.    .   # paroxysmal atrial tachycardia on Cardizem at home.  Patient went into a brief episode of A. fib RVR and rate of 110-1 20s given Cardizem.  She converted to sinus rhythm in less than 3 hours. holding off on a/c at this time since short brief epidsode of afib.  Continue monitoring on tele Cards following Tsh nml Continue cardizem  #Possible sleep apnea with desaturations overnight sleep study as outpatient  Estimated body mass index is 40.94 kg/m as calculated from the following:   Height as of this encounter: 5' 4" (1.626 m).   Weight as of this encounter: 108.2 kg.  DVT prophylaxis: lovenox..>scd as hg dropped Code Status: full Family Communication: husband at bedside Disposition Plan:  Status is: Inpatient  Remains inpatient appropriate because:Persistent severe electrolyte disturbances  Dispo: The patient is from: Home              Anticipated d/c is to: Home              Patient currently is not medically stable to d/c.   Difficult to place patient No   Consultants:, Cardiology, ID  Procedures: none Antimicrobials: none  Subjective: sleepy as she has been to [facility] and here since this morning. Has no cp or sob. No  fever or chills  Objective: Vitals:   03/30/21 0356 03/30/21 0627 03/30/21 0627 03/30/21 1240  BP:  (!) 146/96 (!) 146/96 (!) 154/76  Pulse:   69 70  Resp:    18  Temp:   97.6 F (36.4 C) 98.2 F (36.8 C)  TempSrc:    Oral  SpO2:   99% 97%  Weight: 108.2 kg     Height:        Intake/Output Summary (Last 24 hours) at 03/30/2021 1910 Last data filed at 03/30/2021 1400 Gross per 24 hour  Intake 360 ml  Output --  Net 360 ml   Filed Weights   03/28/21 0500 03/29/21 0420 03/30/21 0356  Weight: 108.3 kg 109.4 kg 108.2 kg    Examination: Milldy sleepy Cta no wheezing Regular s1/s2 no gallop Soft benign +bs No edema Awakens, oriented x4.    Data Reviewed: I have personally reviewed following labs and imaging studies  CBC: Recent Labs  Lab 03/26/21 0402 03/27/21 0351 03/28/21 0333 03/29/21 0342  WBC 14.6* 18.0* 30.2* 25.6*  HGB 8.1* 8.0* 8.1* 7.6*  HCT 27.3* 27.2* 27.6* 26.0*  MCV 63.0* 63.6* 63.7* 64.5*  PLT 349 361 412* 161   Basic Metabolic Panel: Recent Labs  Lab 03/26/21 2303 03/27/21 0351 03/27/21 1132 03/28/21 0333 03/29/21 0342 03/30/21 0341  NA 132*  133* 133* 133* 134*  --   K 4.5 4.7 4.3 4.9 5.0 4.3  CL 99 99 97* 99 102  --   CO2 _0 --   GLUCOSE 429* 393* 467* 379* 303*  --   BUN _1 25* 27*  --   CREATININE 1.30* 1.23* 1.11* 1.16* 1.17*  --   CALCIUM 9.0 9.1 9.4 9.3 8.7*  --    GFR: Estimated Creatinine Clearance: 56.1 mL/min (A) (by C-G formula based on SCr of 1.17 mg/dL (H)). Liver Function Tests: Recent Labs  Lab 03/26/21 0402 03/27/21 0351 03/28/21 0333 03/29/21 0342  AST _2 ALT _3 ALKPHOS 62 68 84 68  BILITOT 0.7 0.6 0.2* 0.2*  PROT 7.1 7.5 7.6 6.9  ALBUMIN 3.2* 3.1* 3.3* 3.1*   No results for input(s): LIPASE, AMYLASE in the last 168 hours. No results for input(s): AMMONIA in the last 168 hours. Coagulation Profile: No results for input(s): INR, PROTIME in the last 168 hours. Cardiac  Enzymes: Recent Labs  Lab 03/25/21 1126  CKTOTAL 213   BNP (last 3 results) Recent Labs    03/20/21 1220  PROBNP 57.0   HbA1C: No results for input(s): HGBA1C in the last 72 hours. CBG: Recent Labs  Lab 03/29/21 2203 03/30/21 0353 03/30/21 0717 03/30/21 1337 03/30/21 1643  GLUCAP 173* 175* 152* 121* 204*   Lipid Profile: No results for input(s): CHOL, HDL, LDLCALC, TRIG, CHOLHDL, LDLDIRECT in the last 72 hours. Thyroid Function Tests: No results for input(s): TSH, T4TOTAL, FREET4, T3FREE, THYROIDAB in the last 72 hours. Anemia Panel: No results for input(s): VITAMINB12, FOLATE, FERRITIN, TIBC, IRON, RETICCTPCT in the last 72 hours.  Sepsis Labs: Recent Labs  Lab 03/24/21 0225  PROCALCITON <0.10    Recent Results (from the past 240 hour(s))  Resp Panel by RT-PCR (Flu A&B, Covid) Nasopharyngeal Swab     Status: None   Collection Time: 03/23/21  5:32 PM   Specimen: Nasopharyngeal Swab; Nasopharyngeal(NP) swabs in vial transport medium  Result Value Ref Range Status   SARS Coronavirus 2 by RT PCR NEGATIVE NEGATIVE Final    Comment: (NOTE) SARS-CoV-2 target nucleic acids are NOT DETECTED.  The SARS-CoV-2 RNA is generally detectable in upper respiratory specimens during the acute phase of infection. The lowest concentration of SARS-CoV-2 viral copies this assay can detect is 138 copies/mL. A negative result does not preclude SARS-Cov-2 infection and should not be used as the sole basis for treatment or other patient management decisions. A negative result may occur with  improper specimen collection/handling, submission of specimen other than nasopharyngeal swab, presence of viral mutation(s) within the areas targeted by this assay, and inadequate number of viral copies(<138 copies/mL). A negative result must be combined with clinical observations, patient history, and epidemiological information. The expected result is Negative.  Fact Sheet for Patients:   EntrepreneurPulse.com.au  Fact Sheet for Healthcare Providers:  IncredibleEmployment.be  This test is no t yet approved or cleared by the [place] FDA and  has been authorized for detection and/or diagnosis of SARS-CoV-2 by FDA under an Emergency Use Authorization (EUA). This EUA will remain  in effect (meaning this test can be used) for the duration of the COVID-19 declaration under Section 564(b)(1) of the Act, 21 U.S.C.section 360bbb-3(b)(1), unless the authorization is terminated  or revoked sooner.       Influenza A by PCR NEGATIVE NEGATIVE Final   Influenza B by PCR NEGATIVE  NEGATIVE Final    Comment: (NOTE) The Xpert Xpress SARS-CoV-2/FLU/RSV plus assay is intended as an aid in the diagnosis of influenza from Nasopharyngeal swab specimens and should not be used as a sole basis for treatment. Nasal washings and aspirates are unacceptable for Xpert Xpress SARS-CoV-2/FLU/RSV testing.  Fact Sheet for Patients: EntrepreneurPulse.com.au  Fact Sheet for Healthcare Providers: IncredibleEmployment.be  This test is not yet approved or cleared by the [place] FDA and has been authorized for detection and/or diagnosis of SARS-CoV-2 by FDA under an Emergency Use Authorization (EUA). This EUA will remain in effect (meaning this test can be used) for the duration of the COVID-19 declaration under Section 564(b)(1) of the Act, 21 U.S.C. section 360bbb-3(b)(1), unless the authorization is terminated or revoked.  Performed at [facility], [place] [address] [place], [place] [address]   Urine culture     Status: Abnormal   Collection Time: 03/23/21  5:32 PM   Specimen: Urine, Clean Catch  Result Value Ref Range Status   Specimen Description   Final    URINE, CLEAN CATCH Performed at [facility], [place] [address] [place], [place] [address]    Special Requests    Final    NONE Performed at [facility], [place] [address], [place], [place] [address]    Culture (A)  Final    <10,000 COLONIES/mL INSIGNIFICANT GROWTH Performed at [place] [address] [place], [place] [address]    Report Status 03/25/2021 FINAL  Final  Culture, blood (Routine X 2) w Reflex to ID Panel     Status: None   Collection Time: 03/24/21  2:25 AM   Specimen: BLOOD  Result Value Ref Range Status   Specimen Description   Final    BLOOD BLOOD LEFT FOREARM Performed at [place] [address], [place], [place] [address]    Special Requests   Final    BOTTLES DRAWN AEROBIC AND ANAEROBIC Blood Culture adequate volume Performed at [place] [address], [place], [place] [address]    Culture   Final    NO GROWTH 5 DAYS Performed at [facility], [place] [address], [place], [place] [address]    Report Status 03/29/2021 FINAL  Final  Culture, blood (Routine X 2) w Reflex to ID Panel     Status: None   Collection Time: 03/24/21  2:30 AM   Specimen: BLOOD  Result Value Ref Range Status   Specimen Description   Final    BLOOD BLOOD RIGHT HAND Performed at [place] [address], [place], [place] [address]    Special Requests   Final    BOTTLES DRAWN AEROBIC AND ANAEROBIC Blood Culture adequate volume Performed at [place] [address], [place], [place] [address]    Culture   Final    NO GROWTH 5 DAYS Performed at [facility], [place] [address], [place], [place] [address]    Report Status 03/29/2021 FINAL  Final  Culture, blood (routine x 2)     Status: None   Collection Time: 03/24/21 11:30 AM   Specimen: BLOOD  Result Value Ref Range Status   Specimen Description   Final    BLOOD LEFT ANTECUBITAL Performed at [facility], [place] [address] [place],  [address]    Special Requests   Final    BOTTLES DRAWN AEROBIC AND  ANAEROBIC Blood Culture adequate volume  Performed at [facility], [place] [address] [place], [place] [address]    Culture   Final    NO GROWTH 5 DAYS Performed at [facility], [place] [address] [place], [place] [address]    Report Status 03/29/2021 FINAL  Final  MRSA PCR Screening     Status: None   Collection Time: 03/25/21  1:27 PM   Specimen: Nasal Mucosa; Nasopharyngeal  Result Value Ref Range Status   MRSA by PCR NEGATIVE NEGATIVE Final    Comment:        The GeneXpert MRSA Assay (FDA approved for NASAL specimens only), is one component of a comprehensive MRSA colonization surveillance program. It is not intended to diagnose MRSA infection nor to guide or monitor treatment for MRSA infections. Performed at [facility], [place] [address], [place], [place] [address]   Respiratory (~20 pathogens) panel by PCR     Status: None   Collection Time: 03/26/21  7:50 AM  Result Value Ref Range Status   Adenovirus NOT DETECTED NOT DETECTED Final   Coronavirus 229E NOT DETECTED NOT DETECTED Final    Comment: (NOTE) The Coronavirus on the Respiratory Panel, DOES NOT test for the novel  Coronavirus (2019 nCoV)    Coronavirus HKU1 NOT DETECTED NOT DETECTED Final   Coronavirus NL63 NOT DETECTED NOT DETECTED Final   Coronavirus OC43 NOT DETECTED NOT DETECTED Final   Metapneumovirus NOT DETECTED NOT DETECTED Final   Rhinovirus / Enterovirus NOT DETECTED NOT DETECTED Final   Influenza A NOT DETECTED NOT DETECTED Final   Influenza B NOT DETECTED NOT DETECTED Final   Parainfluenza Virus 1 NOT DETECTED NOT DETECTED Final   Parainfluenza Virus 2 NOT DETECTED NOT DETECTED Final   Parainfluenza Virus 3 NOT DETECTED NOT DETECTED Final   Parainfluenza Virus 4 NOT DETECTED NOT DETECTED Final   Respiratory Syncytial Virus NOT DETECTED NOT DETECTED Final   Bordetella pertussis NOT DETECTED NOT DETECTED Final   Bordetella Parapertussis NOT DETECTED NOT  DETECTED Final   Chlamydophila pneumoniae NOT DETECTED NOT DETECTED Final   Mycoplasma pneumoniae NOT DETECTED NOT DETECTED Final    Comment: Performed at [facility], [place] [address], [place], [place] [address]  Culture, Respiratory w Gram Stain     Status: None (Preliminary result)   Collection Time: 03/28/21  2:10 PM   Specimen: Bronchoalveolar Lavage; Respiratory  Result Value Ref Range Status   Specimen Description BRONCHIAL ALVEOLAR LAVAGE  Final   Special Requests NONE  Final   Gram Stain   Final    FEW WBC PRESENT, PREDOMINANTLY MONONUCLEAR NO ORGANISMS SEEN    Culture   Final    NO GROWTH 2 DAYS Performed at [facility], [address], [place], [place] [address]    Report Status PENDING  Incomplete  Acid Fast Smear (AFB)     Status: None   Collection Time: 03/28/21  2:10 PM   Specimen: Bronchial Alveolar Lavage  Result Value Ref Range Status   AFB Specimen Processing Concentration  Final   Acid Fast Smear Negative  Final    Comment: (NOTE) Performed At: [facility] [place], [place] [id number] [place] MD NL:[id number]    Source (AFB) BRONCHIAL ALVEOLAR LAVAGE  Final    Comment: RML Performed at [facility], [place] [address], [place], [place] [address]           Radiology Studies: CT Chest High Resolution  Result Date: 03/30/2021  CLINICAL DATA:  Interstitial lung disease, lymphocytosis on bronchoalveolar lavage. Acute hypoxic respiratory failure. EXAM: CT CHEST WITHOUT CONTRAST TECHNIQUE: Multidetector CT imaging of the chest was performed following the standard protocol without intravenous contrast. High resolution imaging of the lungs, as well as inspiratory and expiratory imaging, was performed. COMPARISON:  03/21/2021. FINDINGS: Cardiovascular: Atherosclerotic calcification of the aorta, aortic valve and coronary arteries. Pulmonic trunk and heart are enlarged. No pericardial effusion. Mediastinum/Nodes: Numerous  mediastinal lymph nodes, none of which are enlarged by CT size criteria. Hilar regions are difficult to evaluate without IV contrast. No axillary adenopathy. Esophagus is grossly unremarkable. Lungs/Pleura: Interval development of worsening peribronchovascular ground-glass, interstitial thickening and scattered collapse/consolidation, upper and midlung zone predominant. No definite air trapping. There may be trace right pleural fluid. Airway is unremarkable. Upper Abdomen: Visualized portions of the liver, adrenal glands, kidneys, spleen, pancreas, stomach and bowel are grossly unremarkable. Upper abdominal lymph nodes are not enlarged by CT size criteria. Musculoskeletal: Degenerative changes in the spine. IMPRESSION: 1. Marked increase in upper/midlung zone predominant peribronchovascular ground-glass, interstitial thickening and scattered collapse/consolidation when compared with 03/21/2021. Findings are likely due to an atypical/viral pneumonia, including due to COVID-19. Acute drug toxicity is not excluded. Findings are suggestive of an alternative diagnosis (not UIP) per consensus guidelines: Diagnosis of Idiopathic Pulmonary Fibrosis: An Official ATS/ERS/JRS/ALAT Clinical Practice Guideline. [place], Iss 5, [phone], Jun 15 2017. 2. Trace right pleural fluid. 3. Aortic atherosclerosis (ICD10-I70.0). Coronary artery calcification. 4. Enlarged pulmonic trunk, indicative of pulmonary arterial hypertension. Electronically Signed   By: Lorin Picket M.D.   On: 03/30/2021 14:14   MR CARDIAC MORPHOLOGY W WO CONTRAST  Result Date: 03/30/2021 CLINICAL DATA:  Cardiac mass EXAM: CARDIAC MRI TECHNIQUE: The patient was scanned on a 1.5 Tesla GE magnet. A dedicated cardiac coil was used. Functional imaging was done using F[place]a sequences. 2,3, and 4 chamber views were done to assess for RWMA's. Modified Simpson's rule using a short axis stack was used to calculate an ejection fraction on a  dedicated work Conservation officer, nature. The patient received 10 cc of Gadavist. After 10 minutes inversion recovery sequences were used to assess for infiltration and scar tissue. CONTRAST:  10 cc  of Gadavist FINDINGS: 1. Normal left ventricular size, thickness and systolic function (LVEF = 53%). There are no regional wall motion abnormalities. There is no late gadolinium enhancement in the left ventricular myocardium. There is a 21 x 19 mm mass at the level of the mitral annulus, posterior lateral segment. The mass was hypointense on T1, T2-weighted and short tau inversion recovery sequences. The mass did not show any contrast uptake or late gadolinium enhancement highly suggestive of degenerative mitral annular calcification. LVEDV: 146 ml LVESV: 68 ml SV: 77 ml CO: 5L/min Myocardial mass: 113g 2. Normal right ventricular size, thickness and systolic function (RVEF = 48%). There are no regional wall motion abnormalities. 3.  Normal left and right atrial size. 4. Normal size of the aortic root, ascending aorta and pulmonary artery. 5.  Mild mitral regurgitation. 6.  Normal pericardium.  No pericardial effusion. IMPRESSION: 1.  Normal right and left ventricular size and function, LVEF 53%. 2. Posterolateral mitral annulus mass consistent with degenerative mitral annular calcification (hypointense on T1, T2 and STIR sequences, no LGE). 3. I reviewed today's High resolution chest CT which confirmed calcification of mitral annular mass as above. 4.  No scar or late gadolinium enhancement noted on LV myocardium. Kate Sable Electronically  Signed   By: Kate Sable M.D.   On: 03/30/2021 16:09        Scheduled Meds:  arformoterol  15 mcg Nebulization BID   atorvastatin  80 mg Oral q1800   diltiazem  60 mg Oral Q8H   fluticasone  1 spray Each Nare Daily   insulin aspart  0-20 Units Subcutaneous TID WC   insulin aspart  0-5 Units Subcutaneous QHS   insulin aspart  9 Units Subcutaneous TID WC    insulin glargine  42 Units Subcutaneous QHS   loratadine  10 mg Oral Daily   losartan  100 mg Oral Daily   methylPREDNISolone (SOLU-MEDROL) injection  40 mg Intravenous Q24H   multivitamin with minerals  1 tablet Oral Daily   revefenacin  175 mcg Nebulization Daily   saccharomyces boulardii  250 mg Oral BID   sulfamethoxazole-trimethoprim  1 tablet Oral Q12H   Continuous Infusions:  cefTRIAXone (ROCEPHIN)  IV 2 g (03/30/21 1407)   lactated ringers       LOS: 7 days    Time spent: 35 minutes with more than 50% on [place], MD 03/30/2021, 7:10 PM

## 2021-03-30 NOTE — Progress Notes (Addendum)
Progress Note  Patient Name: Lynn [name] Date of Encounter: 03/30/2021  Primary Cardiologist: Glenetta Hew, MD  Subjective   Patient off the floor for MRI at [facility], will reassess when patient returns.  Inpatient Medications    Scheduled Meds:  arformoterol  15 mcg Nebulization BID   atorvastatin  80 mg Oral q1800   diltiazem  60 mg Oral Q8H   fluticasone  1 spray Each Nare Daily   insulin aspart  0-20 Units Subcutaneous TID WC   insulin aspart  0-5 Units Subcutaneous QHS   insulin aspart  9 Units Subcutaneous TID WC   insulin glargine  42 Units Subcutaneous QHS   loratadine  10 mg Oral Daily   losartan  100 mg Oral Daily   methylPREDNISolone (SOLU-MEDROL) injection  40 mg Intravenous Q24H   multivitamin with minerals  1 tablet Oral Daily   revefenacin  175 mcg Nebulization Daily   saccharomyces boulardii  250 mg Oral BID   Continuous Infusions:  cefTRIAXone (ROCEPHIN)  IV     lactated ringers     PRN Meds: acetaminophen **OR** acetaminophen, albuterol, guaiFENesin-dextromethorphan, HYDROcodone bit-homatropine, ondansetron (ZOFRAN) IV   Vital Signs    Vitals:   03/29/21 2000 03/30/21 0356 03/30/21 0627 03/30/21 0627  BP: 130/73  (!) 146/96 (!) 146/96  Pulse: 73   69  Resp:      Temp: 98 F (36.7 C)   97.6 F (36.4 C)  TempSrc:      SpO2: 97%   99%  Weight:  108.2 kg    Height:        Intake/Output Summary (Last 24 hours) at 03/30/2021 1009 Last data filed at 03/29/2021 1300 Gross per 24 hour  Intake 360 ml  Output --  Net 360 ml   Last 3 Weights 03/30/2021 03/29/2021 03/28/2021  Weight (lbs) 238 lb 8.6 oz 241 lb 2.9 oz 238 lb 12.1 oz  Weight (kg) 108.2 kg 109.4 kg 108.3 kg     Telemetry    NSR - Personally Reviewed  Physical Exam   Deferred - will revisit exam when patient returns  Labs    High Sensitivity Troponin:  No results for input(s): TROPONINIHS in the last 720 hours.    Cardiac EnzymesNo results for input(s): TROPONINI in  the last 168 hours. No results for input(s): TROPIPOC in the last 168 hours.   Chemistry Recent Labs  Lab 03/27/21 0351 03/27/21 1132 03/28/21 0333 03/29/21 0342 03/30/21 0341  NA 133* 133* 133* 134*  --   K 4.7 4.3 4.9 5.0 4.3  CL 99 97* 99 102  --   CO2 27 25 28 27   --   GLUCOSE 393* 467* 379* 303*  --   BUN 19 22 25* 27*  --   CREATININE 1.23* 1.11* 1.16* 1.17*  --   CALCIUM 9.1 9.4 9.3 8.7*  --   PROT 7.5  --  7.6 6.9  --   ALBUMIN 3.1*  --  3.3* 3.1*  --   AST 18  --  18 21  --   ALT 18  --  22 25  --   ALKPHOS 68  --  84 68  --   BILITOT 0.6  --  0.2* 0.2*  --   GFRNONAA 48* 54* 52* 51*  --   ANIONGAP 7 11 6 5   --      Hematology Recent Labs  Lab 03/27/21 0351 03/27/21 1132 03/28/21 0333 03/29/21 0342  WBC 18.0*  --  30.2* 25.6*  RBC 4.28 4.49 4.33 4.03  HGB 8.0*  --  8.1* 7.6*  HCT 27.2*  --  27.6* 26.0*  MCV 63.6*  --  63.7* 64.5*  MCH 18.7*  --  18.7* 18.9*  MCHC 29.4*  --  29.3* 29.2*  RDW 18.5*  --  18.6* 18.6*  PLT 361  --  412* 370    BNP Recent Labs  Lab 03/26/21 1457  BNP 152.5*     DDimer  Recent Labs  Lab 03/26/21 1433 03/27/21 0351  DDIMER 2.23* 2.65*     Radiology    ECHO TEE  Result Date: 03/28/2021    TRANSESOPHOGEAL ECHO REPORT   Patient Name:   SYDNEI OHAVER Date of Exam: 03/28/2021 Medical Rec #:  [id number]               Height:       64.0 in Accession #:    [id number]              Weight:       238.8 lb Date of Birth:  [birth year]                BSA:          2.109 m Patient Age:    65 years                BP:           118/55 mmHg Patient Gender: F                       HR:           94 bpm. Exam Location:  Inpatient Procedure: 3D Echo, Transesophageal Echo, Cardiac Doppler and Color Doppler Indications:     I34.8 Other nonrheumatic mitral valve disorders  History:         Patient has prior history of Echocardiogram examinations, most                  recent 03/24/2021. CAD, Signs/Symptoms:Murmur and Syncope; Risk                   Factors:Hypertension, Diabetes, Dyslipidemia and Sleep Apnea.  Sonographer:     Jonelle Sidle Dance Referring Phys:  [id number] ZTIWP Y KDXI Diagnosing Phys: Mertie Moores MD PROCEDURE: The transesophogeal probe was passed without difficulty through the esophogus of the patient. Sedation performed by different physician. The patient was monitored while under deep sedation. Anesthestetic sedation was provided intravenously by Anesthesiology: 130mg  of Propofol, 60mg  of Lidocaine. Patients was under conscious sedation during this procedure. Anesthetic administered: 59mcg of Fentanyl. The patient developed no complications during the procedure. IMPRESSIONS  1. Left ventricular ejection fraction, by estimation, is 55 to 60%. The left ventricle has normal function. The left ventricle has no regional wall motion abnormalities.  2. Right ventricular systolic function was not well visualized. The right ventricular size is not well visualized.  3. No left atrial/left atrial appendage thrombus was detected.  4. 3-D images of the mitral annular mass were obtained. There is a large circumscribed mass adherent to the posterior annulus. The posterior leaflet is fixed . There is moderate - severe mitral stenosis.     The mass is large , measuring 1.8 x 1.4 cm. Differential diagnosis includes myxoma, exuberant mitral annular calcification, myxoma, or other primary cardiac tumor. suggest Cardiac MRI for further evaluation . This mass was present on transthoracic echo from 2018. . The mitral valve  is abnormal. Mild mitral valve regurgitation. Moderate to severe mitral stenosis. Severe mitral annular calcification.  5. The aortic valve is tricuspid. Aortic valve regurgitation is not visualized. No aortic stenosis is present.  6. There is mild (Grade II) plaque. FINDINGS  Left Ventricle: Left ventricular ejection fraction, by estimation, is 55 to 60%. The left ventricle has normal function. The left ventricle has no regional wall motion  abnormalities. The left ventricular internal cavity size was normal in size. Right Ventricle: The right ventricular size is not well visualized. Right vetricular wall thickness was not well visualized. Right ventricular systolic function was not well visualized. Left Atrium: Left atrial size was normal in size. No left atrial/left atrial appendage thrombus was detected. Right Atrium: Right atrial size was normal in size. Pericardium: There is no evidence of pericardial effusion. Mitral Valve: 3-D images of the mitral annular mass were obtained. There is a large circumscribed mass adherent to the posterior annulus. The posterior leaflet is fixed . There is moderate - severe mitral stenosis. The mass is large , measuring 1.8 x 1.4 cm. Differential diagnosis includes myxoma, exuberant mitral annular calcification, myxoma, or other primary cardiac tumor. suggest Cardiac MRI for further evaluation . This mass was present on transthoracic echo from 2018. The mitral valve is abnormal. Severe mitral annular calcification. Mild mitral valve regurgitation. Moderate to severe mitral valve stenosis. MV peak gradient, 19.9 mmHg. The mean mitral valve gradient is 14.0 mmHg. Tricuspid Valve: The tricuspid valve is grossly normal. Tricuspid valve regurgitation is mild. Aortic Valve: The aortic valve is tricuspid. Aortic valve regurgitation is not visualized. No aortic stenosis is present. Pulmonic Valve: The pulmonic valve was grossly normal. Pulmonic valve regurgitation is not visualized. Aorta: The aortic root and ascending aorta are structurally normal, with no evidence of dilitation. There is mild (Grade II) plaque. IAS/Shunts: The atrial septum is grossly normal.  MITRAL VALVE MV Peak grad: 19.9 mmHg MV Mean grad: 14.0 mmHg MV Vmax:      2.23 m/s MV Vmean:     178.0 cm/s Mertie Moores MD Electronically signed by Mertie Moores MD Signature Date/Time: 03/28/2021/4:14:10 PM    Final     Cardiac Studies   03/28/21 TEE   IMPRESSIONS     1. Left ventricular ejection fraction, by estimation, is 55 to 60%. The  left ventricle has normal function. The left ventricle has no regional  wall motion abnormalities.   2. Right ventricular systolic function was not well visualized. The right  ventricular size is not well visualized.   3. No left atrial/left atrial appendage thrombus was detected.   4. 3-D images of the mitral annular mass were obtained. There is a large  circumscribed mass adherent to the posterior annulus. The posterior  leaflet is fixed . There is moderate - severe mitral stenosis.      The mass is large , measuring 1.8 x 1.4 cm. Differential diagnosis  includes myxoma, exuberant mitral annular calcification, myxoma, or other  primary cardiac tumor. suggest Cardiac MRI for further evaluation . This  mass was present on transthoracic  echo from 2018. . The mitral valve is abnormal. Mild mitral valve  regurgitation. Moderate to severe mitral stenosis. Severe mitral annular  calcification.   5. The aortic valve is tricuspid. Aortic valve regurgitation is not  visualized. No aortic stenosis is present.   6. There is mild (Grade II) plaque.   FINDINGS   Left Ventricle: Left ventricular ejection fraction, by estimation, is 55  to 60%. The  left ventricle has normal function. The left ventricle has no  regional wall motion abnormalities. The left ventricular internal cavity  size was normal in size.   Mitral Valve: 3-D images of the mitral annular mass were obtained. There  is a large circumscribed mass adherent to the posterior annulus. The  posterior leaflet is fixed . There is moderate - severe mitral stenosis.  The mass is large , measuring 1.8 x 1.4 cm. Differential diagnosis  includes myxoma, exuberant mitral annular calcification, myxoma, or other  primary cardiac tumor. suggest Cardiac MRI for further evaluation . This  mass was present on transthoracic echo  from 2018. The mitral valve  is abnormal. Severe mitral annular  calcification. Mild mitral valve regurgitation. Moderate to severe mitral  valve stenosis. MV peak gradient, 19.9 mmHg. The mean mitral valve  gradient is 14.0 mmHg.   2D echo 03/2021    1. Left ventricular ejection fraction, by estimation, is 60 to 65%. The  left ventricle has normal function. The left ventricle has no regional  wall motion abnormalities. There is moderate left ventricular hypertrophy  of the basal-septal segment. Left  ventricular diastolic parameters are consistent with Grade II diastolic  dysfunction (pseudonormalization). Elevated left ventricular end-diastolic  pressure.   2. Right ventricular systolic function is normal. The right ventricular  size is normal. There is mildly elevated pulmonary artery systolic  pressure. The estimated right ventricular systolic pressure is 22.2 mmHg.   3. Left atrial size was mildly dilated.   4. The mitral valve is abnormal. There is an ill defined density  measuring 3.23 x 2.10cm that appears to originate off the posterior MV  leaflet but cannot be definite on this. There is also mitral annular  calcification present. There is moderate mitral  stenosis with a mean MVG of 63mmHg. No evidence of mitral valve  regurgitation.   5. The aortic valve is normal in structure. Aortic valve regurgitation is  not visualized. No aortic stenosis is present.   6. The inferior vena cava is normal in size with greater than 50%  respiratory variability, suggesting right atrial pressure of 3 mmHg.   7. Recommend TEE for possible mitral valve endocarditis in setting of  abnormal mitral valve and fevers.   LHC 2018 Moderate to heavy coronary calcification. Segmental 40-50% LAD narrowing. 30-50% proximal to mid RCA. Coronaries are widely patent without high-grade stenosis. Fluoroscopy demonstration of inferolateral calcified mass, likely representing mitral annular calcification. Consider CT or MRI if there is  not heavy mitral valve calcification on echocardiography. Spontaneous runs of supraventricular tachycardia during the procedure.     RECOMMENDATIONS:   No significant obstructive coronary disease is identified. Etiology of enzyme elevation and clinical presentation could potentially be supraventricular arrhythmia superimposed on the cardiac substrate noted above.    Patient Profile     65 y.o. female with a hx of RA, CAD, presumed coronary vasospasm, mitral stenosis (prior mitral valve abnormality identified), anxiety, stomach ulcers, HTN, HLD, former tobacco use, microcytic anemia, OSA, probable CKD stage IIIa by labs, seizures, thalassemia, DM2, PAT now with possible endocarditis and episode of atrial fib.    Assessment & Plan    1. Fever of unknown origin with acute respiratory failure and interstitial changes on lung imaging - 2D echo raised question of possible mitral valve endocarditis, described in an area previously known to have opacification by fluoroscopy in 2018 and prior echocardiogram -TEE 03/28/21 showed large mass adherent to posterior annulus, fixed posterior leaflet, moderate-severe MR -> recommended  cardiac MRI - Bronch with CCM done yesterday await micro/cytology; path negative for malignancy - Getting steroids, abx - High Res CT planned - ID also following    2. Newly recognized atrial fibrillation - transient - 3 hours on 03/26/21-  maintaining SR since then - not on [place] prior to admission - [facility]ADSVASc 5 but episode was very brief (not on ASA PTA - noet hx of stomach ulcers remotely, also question of dural AVF on MRI) for short episode af a fib will hold anticoagulation at this time, but continue to monitor on telemetry - TSH wnl - continue diltiazem  3. Moderate to severe mitral stenosis  - plan for cardiac MRI then may need cath depending on mass -  See TEE results   4. H/o nonobstructive CAD/coronary vasospasm - recent OP troponin negative, no angina - long  acting diltiazem was changed to short acting on arrival, consider consolidating back - previously on Imdur, listed as not taking PTA - on statin    5. History of diastolic dysfunction - not on diuretic PTA, no history of clinical heart failure by record - BNP generally low this admission - euvolemic     Other medical issues include per IM - Microcytic anemia with h/o thalassemia, leukocytosis - Suspected CKD stage IIIa by historical creatinine - Abnormal brain MRI raising question of dural AV fistula - DM with elevated blood sugars  MD to follow with patient exam/subjective upon completion of cardiac MRI/patient return.    For questions or updates, please contact [place] Please consult [email or web address] for contact info under Cardiology/STEMI.  Signed, Charlie Pitter, PA-C 03/30/2021, 10:09 AM    Personally seen and examined. Agree with above.  Back from cardiac MRI. Comfortable.  alert and oriented x3, no respiratory distress normal respiratory effort.  Awaiting results of MRI.  Mitral valve mass likely caseating mitral annular calcification.  No signs of endocarditis on TEE.  Candee Furbish, MD

## 2021-03-30 NOTE — H&P (Signed)
NAME:  Lynn [name], MRN:  [id number], DOB:  [birth year], LOS: 7 ADMISSION DATE:  03/23/2021, CONSULTATION DATE:  03/25/21 REFERRING MD:  Gwendlyn Deutscher CHIEF COMPLAINT:  respiratory failure, fevers   BRIEF  Lynn [name] is a 65 year old woman with rheumatoid arthritis on methotrexate, HFpEF, hypertension and chronic anemia due to thalassemia, DMII who was admitted on 03/23/21 with acute hypoxemic respiratory failure. She has been treated with doxycycline and azithromycin for concern of bronchitis. She has spiked fevers since admission with a Tmax of 101.67F. She has a dry cough. CTA chest on 6/7 did not show pulmonary emboli. She has scattered diffuse areas of ground glass attenuation. Covid and influenz PCR is negative. PCCM has been consulted for further evaluation and recommendations of her respiratory failure and fevers. Patient reports having fevers, sweats and chills over the past 3 weeks.  She also reports lack of appetite and about a 10 pound weight loss.  She also reports increased knee pain where she had a prior knee replacement and also increased lower back pain. She raised concerns for silicosis and she does not have any risk factors for this disease as she has been a bus driver.  No exposure to stone cutting or other occupations of the sort.  Pertinent  Medical History  Rheumatoid Arthritis Chronic Diastolic Heart Failure Hypertension Microcytic Anemia - Thalassemia DMII   abx    Antibiotics: Doxycycline 6/9 - 6/10 Azithromycin 6/10 -6/14 Ceftriaxone 6/11>> (6/22) Vancomycin 6/11>>6/15       Significant Hospital Events: Including procedures, antibiotic start and stop dates in addition to other pertinent events   Admitted 03/23/2021 6/11 with increasing oxygen needs to 5L 6/14- bronch BAL  - 64% lymphs, 32% mono, Cytology without malig cells . CUtlure negt as of 6/16.  Started Solu-Medrol   Interim History / Subjective:    6/16 - afebrile since  03/26/21. WBC 25K. 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 Cardiac MRI ordered. Abx management by ID.  On 7L [place] yesterday. Now on 5L [place]-> pulse ox 99%. Currently 9:04 AM in Cardiac MRI.  In my discussion with her she tells me that she has been diagnosed with rheumatoid arthritis for the last 1 year.  I believe she sees [facility] rheumatology associates Dr. Melissa Noon practice.  She says she was started on injection methotrexate.  She believes it is 50 mg/ per week.  Never tried oral agents.  She also admits to having a feather pillow [a care down pillow] for many years which she uses.  Bronchoscopy lavage shows greater than 60% lymphocytes.  According to the bedside nurse she was on 7 L nasal cannula yesterday but has now been weaned down to 3 L nasal cannula.  On Solu-Medrol since 03/28/2021  CT chest 2018   - compressive atelectasis CT chest June 2022 - No PE but GGO reported (got contract) CT abd lung image June 2022 - clear lung fields reported  Ct chest HRCT 03/30/2021 -bilateral upper lobe infiltrates groundglass opacities.  [Personally visualized] -worse than a few days ago.  Alternative pattern to UIP per ATS criteria.   Objective   Blood pressure (!) 154/76, pulse 70, temperature 98.2 F (36.8 C), temperature source Oral, resp. rate 18, height _0  (1.626 m), weight 108.2 kg, SpO2 97 %.        Intake/Output Summary (Last 24 hours) at 03/30/2021 1809 Last data filed at 03/30/2021 1400 Gross per 24  hour  Intake 360 ml  Output --  Net 360 ml   Filed Weights   03/28/21 0500 03/29/21 0420 03/30/21 0356  Weight: 108.3 kg 109.4 kg 108.2 kg    Examination: Obese lady lying down in bed.  Just had dinner.  Oxygen on 3 L pulse ox 94%.  No crackles anteriorly.  Normal heart sounds abdomen soft.  Alert and oriented x3   Labs/imaging that I have personally reviewed  (right click and "Reselect all SmartList Selections" daily)    LABS    PULMONARY Recent Labs  Lab 03/24/21 0230  HCO3 25.6  O2SAT 99.0    CBC Recent Labs  Lab 03/27/21 0351 03/28/21 0333 03/29/21 0342  HGB 8.0* 8.1* 7.6*  HCT 27.2* 27.6* 26.0*  WBC 18.0* 30.2* 25.6*  PLT 361 412* 370    COAGULATION No results for input(s): INR in the last 168 hours.  CARDIAC  No results for input(s): TROPONINI in the last 168 hours. No results for input(s): PROBNP in the last 168 hours.   CHEMISTRY Recent Labs  Lab 03/26/21 2303 03/27/21 0351 03/27/21 1132 03/28/21 0333 03/29/21 0342 03/30/21 0341  NA 132* 133* 133* 133* 134*  --   K 4.5 4.7 4.3 4.9 5.0 4.3  CL 99 99 97* 99 102  --   CO2 _0 --   GLUCOSE 429* 393* 467* 379* 303*  --   BUN _1 25* 27*  --   CREATININE 1.30* 1.23* 1.11* 1.16* 1.17*  --   CALCIUM 9.0 9.1 9.4 9.3 8.7*  --    Estimated Creatinine Clearance: 56.1 mL/min (A) (by C-G formula based on SCr of 1.17 mg/dL (H)).   LIVER Recent Labs  Lab 03/26/21 0402 03/27/21 0351 03/28/21 0333 03/29/21 0342  AST _2 ALT _3 ALKPHOS 62 68 84 68  BILITOT 0.7 0.6 0.2* 0.2*  PROT 7.1 7.5 7.6 6.9  ALBUMIN 3.2* 3.1* 3.3* 3.1*     INFECTIOUS Recent Labs  Lab 03/24/21 0225  PROCALCITON <0.10     ENDOCRINE CBG (last 3)  Recent Labs    03/30/21 0717 03/30/21 1337 03/30/21 1643  GLUCAP 152* 121* 204*         IMAGING x48h  - image(s) personally visualized  -   highlighted in bold CT Chest High Resolution  Result Date: 03/30/2021 CLINICAL DATA:  Interstitial lung disease, lymphocytosis on bronchoalveolar lavage. Acute hypoxic respiratory failure. EXAM: CT CHEST WITHOUT CONTRAST TECHNIQUE: Multidetector CT imaging of the chest was performed following the standard protocol without intravenous contrast. High resolution imaging of the lungs, as well as inspiratory and expiratory imaging, was performed. COMPARISON:  03/21/2021. FINDINGS: Cardiovascular: Atherosclerotic  calcification of the aorta, aortic valve and coronary arteries. Pulmonic trunk and heart are enlarged. No pericardial effusion. Mediastinum/Nodes: Numerous mediastinal lymph nodes, none of which are enlarged by CT size criteria. Hilar regions are difficult to evaluate without IV contrast. No axillary adenopathy. Esophagus is grossly unremarkable. Lungs/Pleura: Interval development of worsening peribronchovascular ground-glass, interstitial thickening and scattered collapse/consolidation, upper and midlung zone predominant. No definite air trapping. There may be trace right pleural fluid. Airway is unremarkable. Upper Abdomen: Visualized portions of the liver, adrenal glands, kidneys, spleen, pancreas, stomach and bowel are grossly unremarkable. Upper abdominal lymph nodes are not enlarged by CT size criteria. Musculoskeletal: Degenerative changes in the spine. IMPRESSION: 1. Marked increase in upper/midlung zone predominant peribronchovascular ground-glass, interstitial thickening and scattered collapse/consolidation when compared  with 03/21/2021. Findings are likely due to an atypical/viral pneumonia, including due to COVID-19. Acute drug toxicity is not excluded. Findings are suggestive of an alternative diagnosis (not UIP) per consensus guidelines: Diagnosis of Idiopathic Pulmonary Fibrosis: An Official ATS/ERS/JRS/ALAT Clinical Practice Guideline. [place], Iss 5, [phone], Jun 15 2017. 2. Trace right pleural fluid. 3. Aortic atherosclerosis (ICD10-I70.0). Coronary artery calcification. 4. Enlarged pulmonic trunk, indicative of pulmonary arterial hypertension. Electronically Signed   By: Lorin Picket M.D.   On: 03/30/2021 14:14   MR CARDIAC MORPHOLOGY W WO CONTRAST  Result Date: 03/30/2021 CLINICAL DATA:  Cardiac mass EXAM: CARDIAC MRI TECHNIQUE: The patient was scanned on a 1.5 Tesla GE magnet. A dedicated cardiac coil was used. Functional imaging was done using Fiesta sequences.  2,3, and 4 chamber views were done to assess for RWMA's. Modified Simpson's rule using a short axis stack was used to calculate an ejection fraction on a dedicated work Conservation officer, nature. The patient received 10 cc of Gadavist. After 10 minutes inversion recovery sequences were used to assess for infiltration and scar tissue. CONTRAST:  10 cc  of Gadavist FINDINGS: 1. Normal left ventricular size, thickness and systolic function (LVEF = 53%). There are no regional wall motion abnormalities. There is no late gadolinium enhancement in the left ventricular myocardium. There is a 21 x 19 mm mass at the level of the mitral annulus, posterior lateral segment. The mass was hypointense on T1, T2-weighted and short tau inversion recovery sequences. The mass did not show any contrast uptake or late gadolinium enhancement highly suggestive of degenerative mitral annular calcification. LVEDV: 146 ml LVESV: 68 ml SV: 77 ml CO: 5L/min Myocardial mass: 113g 2. Normal right ventricular size, thickness and systolic function (RVEF = 48%). There are no regional wall motion abnormalities. 3.  Normal left and right atrial size. 4. Normal size of the aortic root, ascending aorta and pulmonary artery. 5.  Mild mitral regurgitation. 6.  Normal pericardium.  No pericardial effusion. IMPRESSION: 1.  Normal right and left ventricular size and function, LVEF 53%. 2. Posterolateral mitral annulus mass consistent with degenerative mitral annular calcification (hypointense on T1, T2 and STIR sequences, no LGE). 3. I reviewed today's High resolution chest CT which confirmed calcification of mitral annular mass as above. 4.  No scar or late gadolinium enhancement noted on LV myocardium. Kate Sable Electronically Signed   By: Kate Sable M.D.   On: 03/30/2021 16:09   s  Resolved Hospital Problem list     Assessment & Plan:  Acute hypoxemic respiratory failure -secondary to interstitial lung disease oxygen dependent .   - Alternative UIP per high-resolution CT chest 03/30/2021- UL predominance -Predominant lymphocytosis greater than 60% on BAL -this is typically consistent with methotrexate hypersensitive pneumonitis (anecdotally seen it more with Subcut) or feather pillow hypersensitive pneumonitis or combination.  Rarely it can be L IP from rheumatoid arthritis -Course 03/30/2021 is of improvement following steroids 03/28/2021  Plan  - continue IV steroids  - continue O2 - goal Pulse ox > 92% due to dark skin color go for higher pulse ox  - husb advised to get rid of feather pillow  - no more methotrexate  > 50% of this > 35 min spent in face to face counseling and care coordination  Best practice (right click and "Reselect all SmartList Selections" daily)  Per primary team      SIGNATURE    Dr. Brand Males, M.D., F.C.C.P,  Pulmonary and Critical Care Medicine Staff Physician, [facility] Director - Interstitial Lung Disease  Program  Pulmonary [place] [facility] [place], [place], [address]  Pager: [phone], If no answer  OR between  19:00-7:00h: page [phone]  [phone] Telephone (clinical office): [phone] Telephone (research): [phone]  6:09 PM 03/30/2021

## 2021-03-31 DIAGNOSIS — K921 Melena: Secondary | ICD-10-CM | POA: Diagnosis not present

## 2021-03-31 DIAGNOSIS — Z9189 Other specified personal risk factors, not elsewhere classified: Secondary | ICD-10-CM

## 2021-03-31 DIAGNOSIS — R195 Other fecal abnormalities: Secondary | ICD-10-CM | POA: Diagnosis not present

## 2021-03-31 DIAGNOSIS — J849 Interstitial pulmonary disease, unspecified: Secondary | ICD-10-CM | POA: Diagnosis not present

## 2021-03-31 DIAGNOSIS — J209 Acute bronchitis, unspecified: Secondary | ICD-10-CM | POA: Diagnosis not present

## 2021-03-31 DIAGNOSIS — I10 Essential (primary) hypertension: Secondary | ICD-10-CM | POA: Diagnosis not present

## 2021-03-31 DIAGNOSIS — M069 Rheumatoid arthritis, unspecified: Secondary | ICD-10-CM

## 2021-03-31 DIAGNOSIS — Z01811 Encounter for preprocedural respiratory examination: Secondary | ICD-10-CM

## 2021-03-31 DIAGNOSIS — I471 Supraventricular tachycardia: Secondary | ICD-10-CM

## 2021-03-31 DIAGNOSIS — J9601 Acute respiratory failure with hypoxia: Secondary | ICD-10-CM | POA: Diagnosis not present

## 2021-03-31 DIAGNOSIS — I5032 Chronic diastolic (congestive) heart failure: Secondary | ICD-10-CM | POA: Diagnosis not present

## 2021-03-31 LAB — GLUCOSE, CAPILLARY
Glucose-Capillary: 262 mg/dL — ABNORMAL HIGH (ref 70–99)
Glucose-Capillary: 267 mg/dL — ABNORMAL HIGH (ref 70–99)
Glucose-Capillary: 272 mg/dL — ABNORMAL HIGH (ref 70–99)
Glucose-Capillary: 274 mg/dL — ABNORMAL HIGH (ref 70–99)
Glucose-Capillary: 275 mg/dL — ABNORMAL HIGH (ref 70–99)
Glucose-Capillary: 349 mg/dL — ABNORMAL HIGH (ref 70–99)

## 2021-03-31 LAB — CBC
HCT: 26.2 % — ABNORMAL LOW (ref 36.0–46.0)
Hemoglobin: 7.8 g/dL — ABNORMAL LOW (ref 12.0–15.0)
MCH: 18.6 pg — ABNORMAL LOW (ref 26.0–34.0)
MCHC: 29.8 g/dL — ABNORMAL LOW (ref 30.0–36.0)
MCV: 62.4 fL — ABNORMAL LOW (ref 80.0–100.0)
Platelets: 338 10*3/uL (ref 150–400)
RBC: 4.2 MIL/uL (ref 3.87–5.11)
RDW: 18.7 % — ABNORMAL HIGH (ref 11.5–15.5)
WBC: 20.5 10*3/uL — ABNORMAL HIGH (ref 4.0–10.5)
nRBC: 0.4 % — ABNORMAL HIGH (ref 0.0–0.2)

## 2021-03-31 LAB — BASIC METABOLIC PANEL
Anion gap: 7 (ref 5–15)
BUN: 24 mg/dL — ABNORMAL HIGH (ref 8–23)
CO2: 29 mmol/L (ref 22–32)
Calcium: 8.9 mg/dL (ref 8.9–10.3)
Chloride: 100 mmol/L (ref 98–111)
Creatinine, Ser: 1.17 mg/dL — ABNORMAL HIGH (ref 0.44–1.00)
GFR, Estimated: 51 mL/min — ABNORMAL LOW (ref >60)
Glucose, Bld: 295 mg/dL — ABNORMAL HIGH (ref 70–99)
Potassium: 4.5 mmol/L (ref 3.5–5.1)
Sodium: 136 mmol/L (ref 135–145)

## 2021-03-31 LAB — OCCULT BLOOD X 1 CARD TO LAB, STOOL: Fecal Occult Bld: POSITIVE — AB

## 2021-03-31 LAB — HYPERSENSITIVITY PNEUMONITIS
A. Pullulans Abs: NEGATIVE
A.Fumigatus #1 Abs: NEGATIVE
Micropolyspora faeni, IgG: NEGATIVE
Pigeon Serum Abs: NEGATIVE
Thermoact. Saccharii: NEGATIVE
Thermoactinomyces vulgaris, IgG: NEGATIVE

## 2021-03-31 LAB — CULTURE, RESPIRATORY W GRAM STAIN: Culture: NO GROWTH

## 2021-03-31 LAB — SEDIMENTATION RATE: Sed Rate: 62 mm/hr — ABNORMAL HIGH (ref 0–22)

## 2021-03-31 MED ORDER — PANTOPRAZOLE SODIUM 40 MG IV SOLR
40.0000 mg | Freq: Two times a day (BID) | INTRAVENOUS | Status: DC
  Administered 2021-03-31 – 2021-04-03 (×6): 40 mg via INTRAVENOUS
  Filled 2021-03-31 (×6): qty 40

## 2021-03-31 MED ORDER — HYDRALAZINE HCL 25 MG PO TABS
25.0000 mg | ORAL_TABLET | Freq: Three times a day (TID) | ORAL | Status: DC
  Administered 2021-03-31 – 2021-04-02 (×5): 25 mg via ORAL
  Filled 2021-03-31 (×5): qty 1

## 2021-03-31 MED ORDER — INSULIN GLARGINE 100 UNIT/ML ~~LOC~~ SOLN
30.0000 [IU] | Freq: Two times a day (BID) | SUBCUTANEOUS | Status: DC
  Administered 2021-03-31 – 2021-04-01 (×3): 30 [IU] via SUBCUTANEOUS
  Filled 2021-03-31 (×3): qty 0.3

## 2021-03-31 MED ORDER — DILTIAZEM HCL ER COATED BEADS 240 MG PO CP24
240.0000 mg | ORAL_CAPSULE | Freq: Every day | ORAL | Status: DC
  Administered 2021-03-31 – 2021-04-03 (×4): 240 mg via ORAL
  Filled 2021-03-31 (×4): qty 1

## 2021-03-31 MED ORDER — SULFAMETHOXAZOLE-TRIMETHOPRIM 800-160 MG PO TABS: 1.0000 | ORAL_TABLET | Freq: Every day | ORAL | Status: DC

## 2021-03-31 NOTE — Consult Note (Signed)
Referring Provider: Dr. Wendee Beavers Primary Care Physician:  [name], MD Primary Gastroenterologist:  Dr. Fuller Plan   Reason for Consultation:  Newman Nickels   HPI: Lynn [name] is a 64 y.o. female with a past medical history of anxiety, depression, rheumatoid arthritis Methotrexate, hypertension, coronary artery disease, presumed coronary spasms, diastolic CHF, hyperlipidemia, OSA uses CPAP, diabetes mellitus type 2, microcytic anemia with thalassemia trait.  She developed shortness of breath with a cough, fever and generalized weakness 2 weeks ago.  She was initially seen by her PCP who prescribed doxycycline and her symptoms did not improve.  A D-dimer test was elevated and she underwent a CT angiogram as an outpatient 6/7 which was negative for PE or pneumonia but showed groundglass opacities.  Shortness of breath worsened so she presented to [facility] ED on 03/23/2021.  She was evaluated by ID who placed her on Azithromycin and steroids for bronchitis without improvement.  An echo 6/9 howed LVEF 60 to 65% and the mitral valve was abnormal with an ill-defined density concerning for possible endocarditis.  She was placed on Ceftriaxone, Vancomycin and Azithromycin per ID.  A TEE on 6/14 showed moderate to severe mitral stenosis with a mass measuring 1.8 x 1.4 cm.  She was no longer thought to have endocarditis. She went into atrial fibrillation with RVR and was chemically converted with Cardizem. CHADSVASc 5. A cardiac MRI showed osterolateral mitral annulus mass consistent with degenerative mitral annular calcification. She started passing soft black stools 2 days ago. A GI consult was requested prior to initiating oral anticoagulation.   She is currently on 5 L nasal cannula.  She stated her cough and shortness of breath have significantly improved over the past few days, however, she endorses dyspnea with exertion.  No hemoptysis.  No chest pain or palpitations.  She denies  having any heartburn or dysphagia.  No upper abdominal pain.  She had intermittent mild right lower quadrant abdominal pain for the past 2 to 3 weeks which has subsided.  She had watery to loose brown stools for few days last week which resolved after she took a probiotic.  She noticed her stools were quite dark 2 days ago.  She described passing a very dark to black soft stool x 2 on 6/15 and x 1 on 6/16.  No BM yet today.  No bright red rectal bleeding.  Her admission hemoglobin level was 9.3 which is her baseline level with a hemoglobin level today of 7.8.  She has not required a blood transfusion since her admission.  She reports having a history of sickle cell anemia. She takes Ibuprofen 200 mg 1 tab 3 to 4 days monthly as needed for aches and pains.  No other NSAID use.  No alcohol use.  She reported having stomach ulcers at the age of 42.  She had recurrent stomach ulcers 10 to 15 years ago confirmed by an EGD which was done 10 or 15 years ago, further details are unclear.  She underwent a colonoscopy 08/23/2016 which identified a 7 mm hyperplastic polyp which was removed from the transverse colon and moderate diverticulosis in the transverse, descending and sigmoid colon.  She reports having fever and sweats for the past 3 to 4 weeks.  No weight loss.  Maternal grandfather and maternal aunt had colon cancer.  CBC Latest Ref Rng & Units 03/31/2021 03/29/2021 03/28/2021  WBC 4.0 - 10.5 K/uL 20.5(H) 25.6(H) 30.2(H)  Hemoglobin 12.0 - 15.0 g/dL 7.8(L) 7.6(L) 8.1(L)  Hematocrit 36.0 - 46.0 % 26.2(L) 26.0(L) 27.6(L)  Platelets 150 - 400 K/uL 338 370 412(H)  MCV 62.4.  Brain MRI 03/26/2021: No evidence of septic emboli are other acute abnormality.  Chronic left parietal volume loss with gyral calcification. Increased cortical veins in this region and relative prominence of left deep cerebral veins raise possibility of a dural arteriovenous fistula.  CTAP without contrast  03/23/2021: 1. No acute  abnormality identified in the abdomen and pelvis. 2. Fatty infiltration of liver. 3. Diverticulosis of colon without diverticulitis. 4. Aortic atherosclerosis.  Aortic Atherosclerosis  Colonoscopy 08/23/2016 by Dr. Fuller Plan: - One 7 mm polyp in the transverse colon, removed with a cold snare. Resected and retrieved. - Moderate diverticulosis in the sigmoid colon and in the descending colon. - Diverticulosis in the transverse colon. - The examination was otherwise normal on direct and retroflexion views. -Recall colonoscopy 10 years Surgical [P], transverse, polyp - HYPERPLASTIC POLYP. - NO DYSPLASIA OR MALIGNANCY  Past Medical History:  Diagnosis Date   Anxiety    Arthritis    "knees, wrists, back, elbows" (07/03/2017)   Clotting disorder (HCC)    blood clot in eye 2016   Coronary artery disease, non-occlusive    a. 11/2016 NSTEMI/Cath: LM nl, LAD 40p, 24m, D1/2 small, LCX 40ost, OM2/3 nl/small, RCA 35 ost/mid, RPDA/RPL small/nl.   Coronary vasospasm (HCC)    Depression    Diastolic dysfunction    a. 11/2016 Echo: EF 55-60%, gr1 DD, sev calcified MV annulus, mildly dil LA.   Eye hemorrhage 01/2015   "right; resolved" (07/03/2017)   Headache    "monthly" (07/03/2017)   Heart murmur    History of blood transfusion    "when I had an ectopic pregnancy"   History of stomach ulcers    Hyperlipidemia    Hypertension    Microcytic anemia    Mitral stenosis    OSA on CPAP    setting is unknown   PAT (paroxysmal atrial tachycardia) (HCC)    Pneumonia    "several times" (07/03/2017)   Premature atrial contractions    PVC's (premature ventricular contractions)    Syncope 07/03/2017 X 2   called seizures but no medications   Thalassemia    "my cells are sickle cell shaped but I don't have sickle cell anemia" (07/03/2017)   Type II diabetes mellitus ([facility]     Past Surgical History:  Procedure Laterality Date   48-Hour Monitor  03/18/2017   Sinus rhythm with sinus tachycardia (rate  58-134 BPM)multiple PVCs noted with couplets and bigeminy. One triplet. 7 runs of PAT ranging from 100-130 bpm. Longest was 33 beats.   BREAST BIOPSY Left    BRONCHIAL WASHINGS  03/28/2021   Procedure: BRONCHIAL WASHINGS;  Surgeon: CJulian Hy DO;  [place] [facility]  Service: Endoscopy;;   CARDIAC CATHETERIZATION N/A 11/19/2016   Procedure: Left Heart Cath and Coronary Angiography;  Surgeon: HBelva Crome MD;  [facility];  Service: Cardiovascular.    LM nl, LAD 40p, 532m D1/2 small, LCX 40ost, OM2/3 nl/small, RCA 35 ost/mid, RPDA/RPL small/nl.   CARPAL TUNNEL RELEASE Right 11/01/2014   COLONOSCOPY     DILATION AND CURETTAGE OF UTERUS     ECTOPIC PREGNANCY SURGERY  X 2   JOINT REPLACEMENT     KNEE ARTHROSCOPY Left 11/2014   TEE WITHOUT CARDIOVERSION N/A 03/28/2021   Procedure: TRANSESOPHAGEAL ECHOCARDIOGRAM (TEE);  Surgeon: NaAcie FredricksonhWonda ChengMD;  [place] [facility] Service: Cardiovascular;  Laterality: N/A;  TONSILLECTOMY     TOTAL KNEE ARTHROPLASTY Right 02/18/2015   Procedure: RIGHT TOTAL KNEE ARTHROPLASTY;  Surgeon: Netta Cedars, MD;  Location: [place];  Service: Orthopedics;  Laterality: Right;   TOTAL KNEE ARTHROPLASTY Left 08/19/2015   Procedure: LEFT TOTAL KNEE ARTHROPLASTY;  Surgeon: Netta Cedars, MD;  Location: [place];  Service: Orthopedics;  Laterality: Left;   TRANSTHORACIC ECHOCARDIOGRAM  11/2016; 07/2017:   a) normal LV size, thickness and function. EF 55-60%. GR 1 DD.No RWMA severe mitral annular calcification, but no mitral stenosis. Mild LA dilation;; b) normal LV size and function.  EF 65-75%.  Unable to assess diastolic function.  Aortic sclerosis with no stenosis.  Moderate mitral stenosis? (Gradient 9 mmHg) -?  Mild-mod left atrial enlargement    TRANSTHORACIC ECHOCARDIOGRAM  01/2019   EF 65 to 70%.  Normal function.  Elevated LVEDP-GR 1 DD.  Normal RV size and function.  Large calcific mass on posterior mitral leaflet mild to moderate mitral  stenosis.   VAGINAL HYSTERECTOMY     VIDEO BRONCHOSCOPY N/A 03/28/2021   Procedure: VIDEO BRONCHOSCOPY WITHOUT FLUORO;  Surgeon: Julian Hy, DO;  Location[facility] ENDOSCOPY;  Service: Endoscopy;  Laterality: N/A;    Prior to Admission medications   Medication Sig Start Date End Date Taking? Authorizing Provider  acetaminophen (TYLENOL) 500 MG tablet Take 500 mg by mouth every 6 (six) hours as needed for fever.   Yes [provider]  atorvastatin (LIPITOR) 80 MG tablet TAKE 1 TABLET(80 MG) BY MOUTH DAILY AT 6 PM Patient taking differently: Take 80 mg by mouth daily. 02/20/21  Yes Copland, Gay Filler, MD  cetirizine (ZYRTEC) 10 MG tablet Take 10 mg by mouth daily as needed for allergies.   Yes [provider]  diltiazem (CARDIZEM CD) 240 MG 24 hr capsule Take 1 capsule (240 mg total) by mouth daily. 08/09/20 03/23/21 Yes Leonie Man, MD  doxycycline (VIBRAMYCIN) 100 MG capsule Take 1 capsule (100 mg total) by mouth 2 (two) times daily. Patient taking differently: Take 100 mg by mouth 2 (two) times daily. Start date : 03/20/21 03/20/21  Yes Copland, Gay Filler, MD  folic acid (FOLVITE) 1 MG tablet Take 2 tablets (2 mg total) by mouth daily. Patient taking differently: Take 1 mg by mouth 2 (two) times daily. 01/21/20  Yes Copland, Gay Filler, MD  Insulin Disposable Pump (OMNIPOD DASH PODS, GEN 4,) MISC Inject 1 Dose into the skin continuous. Using pump - depends on carb intake. 10/03/20  Yes [provider]  insulin lispro (HUMALOG) 100 UNIT/ML injection Inject 8 Units into the skin See admin instructions. Back up for pump. 02/07/21  Yes [provider]  losartan (COZAAR) 100 MG tablet Take 1 tablet (100 mg total) by mouth daily. 08/09/20 03/23/21 Yes Leonie Man, MD  methotrexate 50 MG/2ML injection Inject 50 mg/m2 into the skin once a week. 08/11/19  Yes [provider]  Multiple Vitamin (MULTIVITAMIN WITH MINERALS) TABS tablet Take 1 tablet by mouth daily.    Yes [provider]  VICTOZA 18 MG/3ML SOPN INJECT 1.8 MG UNDER THE SKIN ONCE DAILY Patient taking differently: Inject 8.1 mg into the skin daily. 02/20/21  Yes Copland, Gay Filler, MD  Continuous Blood Gluc Sensor (DEXCOM G6 SENSOR) MISC 1 Device by Does not apply route as directed. 06/17/20   [name], Melanie Crazier, MD  Continuous Blood Gluc Transmit (DEXCOM G6 TRANSMITTER) MISC 1 Device by Does not apply route as directed. 06/17/20   [name], Melanie Crazier,  MD  fluticasone (FLOVENT DISKUS) 50 MCG/BLIST diskus inhaler Inhale 1 puff into the lungs 2 (two) times daily. 03/17/21   Hazel Sams, PA-C  glucose blood (CONTOUR NEXT TEST) test strip 3x daily 07/20/19   [name], Melanie Crazier, MD  hydrochlorothiazide (HYDRODIURIL) 12.5 MG tablet TAKE 1 TABLET BY MOUTH EVERY DAY 03/27/21   Copland, Gay Filler, MD  Insulin Pen Needle (B-D UF III MINI PEN NEEDLES) 31G X 5 MM MISC USE DAILY WITH VICTOZA AND LANTUS. 07/20/19   [name], Melanie Crazier, MD  Insulin Pen Needle 31G X 5 MM MISC 1 Device by Does not apply route 3 (three) times daily. 01/21/20   Copland, Gay Filler, MD  isosorbide mononitrate (IMDUR) 30 MG 24 hr tablet Take 30 mg  tablet as needed for Chest pain spasm episodes for 3 days and the stop Patient not taking: No sig reported 08/09/20   Leonie Man, MD  Lancets 30G MISC 1 Device by Does not apply route 3 (three) times daily. 07/16/19   Copland, Gay Filler, MD  SAFETY-LOK TB SYRINGE 27GX.5" 27G X 1/2" 1 ML MISC  08/18/19   [provider]  Zoster Vaccine Adjuvanted [facility]) injection Inject into the muscle. Patient taking differently: Inject 0.5 mLs into the muscle once. 02/20/21   Carlyle Basques, MD    Current Facility-Administered Medications  Medication Dose Route Frequency Provider Last Rate Last Admin   acetaminophen (TYLENOL) tablet 650 mg  650 mg Oral Q6H PRN Howerter, Justin B, DO   650 mg at 03/29/21 0007   Or   acetaminophen (TYLENOL) suppository 650  mg  650 mg Rectal Q6H PRN Howerter, Justin B, DO       albuterol (PROVENTIL) (2.5 MG/3ML) 0.083% nebulizer solution 2.5 mg  2.5 mg Nebulization Q4H PRN Howerter, Justin B, DO       arformoterol (BROVANA) nebulizer solution 15 mcg  15 mcg Nebulization BID Freda Jackson B, MD   15 mcg at 03/31/21 0843   atorvastatin (LIPITOR) tablet 80 mg  80 mg Oral q1800 Howerter, Justin B, DO   80 mg at 03/30/21 1804   cefTRIAXone (ROCEPHIN) 2 g in sodium chloride 0.9 % 100 mL IVPB  2 g Intravenous Q24H Carlyle Basques, MD 200 mL/hr at 03/30/21 1407 2 g at 03/30/21 1407   diltiazem (CARDIZEM CD) 24 hr capsule 240 mg  240 mg Oral Daily Dunn, Dayna N, PA-C       fluticasone (FLONASE) 50 MCG/ACT nasal spray 1 spray  1 spray Each Nare Daily Howerter, Justin B, DO   1 spray at 03/30/21 1353   guaiFENesin-dextromethorphan (ROBITUSSIN DM) 100-10 MG/5ML syrup 5 mL  5 mL Oral Q4H PRN Lavina Hamman, MD   5 mL at 03/29/21 1250   HYDROcodone bit-homatropine (HYCODAN) 5-1.5 MG/5ML syrup 5 mL  5 mL Oral Q6H PRN Georgette Shell, MD   5 mL at 03/30/21 2230   insulin aspart (novoLOG) injection 0-20 Units  0-20 Units Subcutaneous TID WC Zierle-Ghosh, Asia B, DO   11 Units at 03/31/21 0831   insulin aspart (novoLOG) injection 0-5 Units  0-5 Units Subcutaneous QHS Georgette Shell, MD   4 Units at 03/30/21 2219   insulin aspart (novoLOG) injection 9 Units  9 Units Subcutaneous TID WC Nolberto Hanlon, MD   9 Units at 03/31/21 0831   insulin glargine (LANTUS) injection 30 Units  30 Units Subcutaneous BID Wendee Beavers T, MD   30 Units at 03/31/21 1142   lactated ringers infusion  Intravenous Continuous Julian Hy, DO       loratadine (CLARITIN) tablet 10 mg  10 mg Oral Daily Howerter, Justin B, DO   10 mg at 03/31/21 9892   losartan (COZAAR) tablet 100 mg  100 mg Oral Daily Howerter, Justin B, DO   100 mg at 03/31/21 1194   methylPREDNISolone sodium succinate (SOLU-MEDROL) 40 mg/mL injection 40 mg  40 mg Intravenous Q24H  Georgette Shell, MD   40 mg at 03/31/21 1740   multivitamin with minerals tablet 1 tablet  1 tablet Oral Daily Howerter, Justin B, DO   1 tablet at 03/31/21 8144   ondansetron (ZOFRAN) injection 4 mg  4 mg Intravenous Q6H PRN Howerter, Justin B, DO   4 mg at 03/28/21 0815   pantoprazole (PROTONIX) EC tablet 40 mg  40 mg Oral Daily Nolberto Hanlon, MD   40 mg at 03/31/21 8185   revefenacin (YUPELRI) nebulizer solution 175 mcg  175 mcg Nebulization Daily Freddi Starr, MD   175 mcg at 03/31/21 6314   saccharomyces boulardii (FLORASTOR) capsule 250 mg  250 mg Oral BID Georgette Shell, MD   250 mg at 03/31/21 9702   sulfamethoxazole-trimethoprim (BACTRIM DS) 800-160 MG per tablet 1 tablet  1 tablet Oral Q12H Carlyle Basques, MD   1 tablet at 03/31/21 6378    Allergies as of 03/23/2021 - Review Complete 03/23/2021  Allergen Reaction Noted   Penicillins Hives 07/22/2012   Farxiga [dapagliflozin] Other (See Comments) 05/01/2019   Metformin and related  06/12/2017    Family History  Problem Relation Age of Onset   Cancer Mother    Diabetes Mother    Hypertension Mother    Hyperlipidemia Mother    Cancer Father    Hyperlipidemia Father    Mental illness Sister    Diabetes Sister    Diabetes Maternal Grandmother    Diabetes Maternal Grandfather    Colon cancer Neg Hx    Esophageal cancer Neg Hx    Stomach cancer Neg Hx    Rectal cancer Neg Hx     Social History   Socioeconomic History   Marital status: Married    Spouse name: Not on file   Number of children: Not on file   Years of education: Not on file   Highest education level: Not on file  Occupational History   Occupation: retired  Tobacco Use   Smoking status: Former    Packs/day: 0.25    Years: 44.00    Pack years: 11.00    Types: Cigarettes    Quit date: 02/17/2015    Years since quitting: 6.1   Smokeless tobacco: Never  Vaping Use   Vaping Use: Never used  Substance and Sexual Activity   Alcohol use:  Yes    Alcohol/week: 0.0 standard drinks    Comment: 07/03/2017 "might have a few drinks/year"   Drug use: No   Sexual activity: Never  Other Topics Concern   Not on file  Social History Narrative   Not on file   Social Determinants of Health   Financial Resource Strain: Not on file  Food Insecurity: Not on file  Transportation Needs: Not on file  Physical Activity: Not on file  Stress: Not on file  Social Connections: Not on file  Intimate Partner Violence: Not on file    Review of Systems: Gen: Denies fever, sweats or chills. No weight loss.  CV: Denies chest pain, palpitations or edema. Resp: See HPI.   GI:  See HPI. GU : Denies urinary burning, blood in urine, increased urinary frequency or incontinence. MS: Denies joint pain, muscles aches or weakness. Derm: Denies rash, itchiness, skin lesions or unhealing ulcers. Psych: Denies depression, anxiety or memory loss. Heme: Denies easy bruising, bleeding. Neuro:  Denies headaches, dizziness or paresthesias. Endo:  Denies any problems with DM, thyroid or adrenal function.  Physical Exam: Vital signs in last 24 hours: Temp:  [98 F (36.7 C)-98.3 F (36.8 C)] 98 F (36.7 C) (06/17 0448) Pulse Rate:  [68-77] 68 (06/17 0448) Resp:  [18-20] 18 (06/17 0448) BP: (154-162)/(63-82) 162/82 (06/17 0448) SpO2:  [95 %-97 %] 96 % (06/17 0843) Weight:  [107.2 kg] 107.2 kg (06/17 0455) Last BM Date: 03/30/21 General:  Alert obese 65 year old female no acute distress. Head:  Normocephalic and atraumatic. Eyes:  No scleral icterus. Conjunctiva pink. Ears:  Normal auditory acuity. Nose:  No deformity, discharge or lesions. Mouth:  Dentition intact. No ulcers or lesions.  Neck:  Supple. No lymphadenopathy or thyromegaly.  Lungs: Sounds coarse throughout. Heart: Regular rate and rhythm, no murmurs. Abdomen: Soft, nontender.  Nondistended.  Positive bowel sounds all 4 quadrants. Rectal: Deferred. Musculoskeletal:  Symmetrical without  gross deformities.  Pulses:  Normal pulses noted. Extremities:  Without clubbing or edema. Neurologic:  Alert and  oriented x4. No focal deficits.  Skin:  Intact without significant lesions or rashes. Psych:  Alert and cooperative. Normal mood and affect.  Intake/Output from previous day: 06/16 0701 - 06/17 0700 In: 610.3 [P.O.:600; IV Piggyback:10.3] Out: 601 [Urine:600; Stool:1] Intake/Output this shift: No intake/output data recorded.  Lab Results: Recent Labs    03/29/21 0342 03/31/21 0431  WBC 25.6* 20.5*  HGB 7.6* 7.8*  HCT 26.0* 26.2*  PLT 370 338   BMET Recent Labs    03/29/21 0342 03/30/21 0341 03/31/21 0431  NA 134*  --  136  K 5.0 4.3 4.5  CL 102  --  100  CO2 27  --  29  GLUCOSE 303*  --  295*  BUN 27*  --  24*  CREATININE 1.17*  --  1.17*  CALCIUM 8.7*  --  8.9   LFT Recent Labs    03/29/21 0342  PROT 6.9  ALBUMIN 3.1*  AST 21  ALT 25  ALKPHOS 68  BILITOT 0.2*   PT/INR No results for input(s): LABPROT, INR in the last 72 hours. Hepatitis Panel No results for input(s): HEPBSAG, HCVAB, HEPAIGM, HEPBIGM in the last 72 hours.    Studies/Results: CT Chest High Resolution  Result Date: 03/30/2021 CLINICAL DATA:  Interstitial lung disease, lymphocytosis on bronchoalveolar lavage. Acute hypoxic respiratory failure. EXAM: CT CHEST WITHOUT CONTRAST TECHNIQUE: Multidetector CT imaging of the chest was performed following the standard protocol without intravenous contrast. High resolution imaging of the lungs, as well as inspiratory and expiratory imaging, was performed. COMPARISON:  03/21/2021. FINDINGS: Cardiovascular: Atherosclerotic calcification of the aorta, aortic valve and coronary arteries. Pulmonic trunk and heart are enlarged. No pericardial effusion. Mediastinum/Nodes: Numerous mediastinal lymph nodes, none of which are enlarged by CT size criteria. Hilar regions are difficult to evaluate without IV contrast. No axillary adenopathy. Esophagus  is grossly unremarkable. Lungs/Pleura: Interval development of worsening peribronchovascular ground-glass, interstitial thickening and scattered collapse/consolidation, upper and midlung zone predominant. No definite air trapping. There may be trace right pleural fluid. Airway is unremarkable. Upper Abdomen: Visualized portions of the liver, adrenal glands, kidneys, spleen, pancreas, stomach and bowel are grossly unremarkable. Upper abdominal lymph nodes are not enlarged by  CT size criteria. Musculoskeletal: Degenerative changes in the spine. IMPRESSION: 1. Marked increase in upper/midlung zone predominant peribronchovascular ground-glass, interstitial thickening and scattered collapse/consolidation when compared with 03/21/2021. Findings are likely due to an atypical/viral pneumonia, including due to COVID-19. Acute drug toxicity is not excluded. Findings are suggestive of an alternative diagnosis (not UIP) per consensus guidelines: Diagnosis of Idiopathic Pulmonary Fibrosis: An Official ATS/ERS/JRS/ALAT Clinical Practice Guideline. Bunk Foss, Iss 5, [phone], Jun 15 2017. 2. Trace right pleural fluid. 3. Aortic atherosclerosis (ICD10-I70.0). Coronary artery calcification. 4. Enlarged pulmonic trunk, indicative of pulmonary arterial hypertension. Electronically Signed   By: Lorin Picket M.D.   On: 03/30/2021 14:14   MR CARDIAC MORPHOLOGY W WO CONTRAST  Result Date: 03/30/2021 CLINICAL DATA:  Cardiac mass EXAM: CARDIAC MRI TECHNIQUE: The patient was scanned on a 1.5 Tesla GE magnet. A dedicated cardiac coil was used. Functional imaging was done using Fiesta sequences. 2,3, and 4 chamber views were done to assess for RWMA's. Modified Simpson's rule using a short axis stack was used to calculate an ejection fraction on a dedicated work Conservation officer, nature. The patient received 10 cc of Gadavist. After 10 minutes inversion recovery sequences were used to assess for infiltration  and scar tissue. CONTRAST:  10 cc  of Gadavist FINDINGS: 1. Normal left ventricular size, thickness and systolic function (LVEF = 53%). There are no regional wall motion abnormalities. There is no late gadolinium enhancement in the left ventricular myocardium. There is a 21 x 19 mm mass at the level of the mitral annulus, posterior lateral segment. The mass was hypointense on T1, T2-weighted and short tau inversion recovery sequences. The mass did not show any contrast uptake or late gadolinium enhancement highly suggestive of degenerative mitral annular calcification. LVEDV: 146 ml LVESV: 68 ml SV: 77 ml CO: 5L/min Myocardial mass: 113g 2. Normal right ventricular size, thickness and systolic function (RVEF = 48%). There are no regional wall motion abnormalities. 3.  Normal left and right atrial size. 4. Normal size of the aortic root, ascending aorta and pulmonary artery. 5.  Mild mitral regurgitation. 6.  Normal pericardium.  No pericardial effusion. IMPRESSION: 1.  Normal right and left ventricular size and function, LVEF 53%. 2. Posterolateral mitral annulus mass consistent with degenerative mitral annular calcification (hypointense on T1, T2 and STIR sequences, no LGE). 3. I reviewed today's High resolution chest CT which confirmed calcification of mitral annular mass as above. 4.  No scar or late gadolinium enhancement noted on LV myocardium. Kate Sable Electronically Signed   By: Kate Sable M.D.   On: 03/30/2021 16:09    IMPRESSION/PLAN:  56. 65 year old female with past medical history of sickle cell anemia was admitted to the hospital with acute respiratory failure and subsequently developed melenic stools 2 days ago. No abdominal pain. Admission Hg 9.3 -> 7.8.  Positive FOBT. -EGD when respiratory and cardiac status stable. EGD benefits and risks discussed including risk with sedation, risk of bleeding, perforation and infection. Timing of EGD to be verified by Dr.  Tarri Glenn. -Pantoprazole 40 mg IV twice daily -CBC in a.m. -Continue to monitor the patient closely for active GI bleeding  2. Acute hypoxemic respiratory failure on 5 L [place] Solu-Medrol IV and Rocephin  3. Atrial fibrillation with RVR, cardioverted to sinus rhythm after diltiazem IV administered. Remains on oral diltiazem. Not on anticoagulation.  4. Posterolateral mitral annulus mass consistent with degenerative mitral annular calcification per cardiac MRI  5. DM II  6. Leukocytosis, likely due to IV Solumedrol     Noralyn Pick  03/31/2021, 11:59 AM

## 2021-03-31 NOTE — Progress Notes (Addendum)
NAME:  Lynn [name], MRN:  [id number], DOB:  1956/02/22, LOS: 8 ADMISSION DATE:  03/23/2021, CONSULTATION DATE:  03/25/2021 REFERRING MD:  Landis Gandy, MD CHIEF COMPLAINT: Respiratory failure, fevers   History of Present Illness:  65 year old woman with PMHx significant for HTN, HFpEF (Echo 03/2021 EF 55-60%), T2DM, chronic anemia 2/2 thalassemia, osteoarthritis (s/p bilateral TKA) and rheumatoid arthritis (on methotrexate) admitted to [facility] 03/23/21 with acute hypoxemic respiratory failure.   Patient was recently treated with doxycycline and azithromycin for presumed bronchitis. Febrile this admission to Tmax of 101.61F with dry cough. CTA Chest 6/7 demonstrating scattered diffuse areas of ground glass attenuation, negative for pulmonary emboli. COVID and influenza PCRs negative.   PCCM consulted for further evaluation/recommendations for respiratory failure and fevers.  Pertinent Medical History:  Rheumatoid Arthritis Chronic Diastolic Heart Failure Hypertension Microcytic Anemia - Thalassemia DMII  Significant Hospital Events: Including procedures, antibiotic start and stop dates in addition to other pertinent events   Admitted 03/23/2021 6/11 with increasing oxygen needs to 5L 6/14 - Bronch BAL - 64% lymphs, 32% mono, Cytology without malig cells. Culture negative as of 6/16.  Started Solu-Medrol. 6/16 - CT Chest completed with GGOs, negative for PE. Cardiac MRI demonstrating mitral annular mass c/w degenerative mitral annular calcification. Melena noted in PM, FOBT+. 6/17 - GI consulted for melena/Hgb drift. Plan for EGD pending pulmonary clearance for procedure. Ceftriaxone started.  Interim History / Subjective:  Feeling well today Breathing feels good Showered today and feels generally better Several questions about CT scan of lungs Reports dark stools x 2 days, GI consulted with FOBT+ Possible EGD per GI  Objective   Blood pressure (!) 164/81, pulse 79, temperature  98.2 F (36.8 C), temperature source Oral, resp. rate 20, height _0  (1.626 m), weight 107.2 kg, SpO2 96 %.        Intake/Output Summary (Last 24 hours) at 03/31/2021 1619 Last data filed at 03/31/2021 0600 Gross per 24 hour  Intake 250.3 ml  Output 601 ml  Net -350.7 ml    Filed Weights   03/29/21 0420 03/30/21 0356 03/31/21 0455  Weight: 109.4 kg 108.2 kg 107.2 kg   Physical Examination: General: Acutely ill-appearing middle-aged woman in NAD. HEENT: [place]/AT, anicteric sclera, PERRL, moist mucous membranes. Neuro: Awake, oriented x 4. Responds to verbal stimuli. Following commands consistently. Moves all 4 extremities spontaneously. Strength 5/5 in all extremities. CV: RRR, no m/g/r. PULM: Breathing even and unlabored on 3L HFNC/[place]. Lung fields with crackles at bilateral bases to mid-lung. GI: Obese, soft, nontender, nondistended. Normoactive bowel sounds. Extremities: Trace symmetric BLE edema noted. Skin: Warm/dry, no rashes.  Labs/imaging that I have personally reviewed:  (right click and "Reselect all SmartList Selections" daily)  WBC 20.5 (25.6), H&H 7.8/26.2, Plt 338  Na 136, K 4.5, Cl 100, CO2 29, BUN 24, Cr 1.17 Ca 8.9  Glucoses 267-349 last 24H  ESR 62 (68)  Cardiac MRI 6/16 IMPRESSION: 1.  Normal right and left ventricular size and function, LVEF 53%.  2. Posterolateral mitral annulus mass consistent with degenerative mitral annular calcification (hypointense on T1, T2 and STIR sequences, no LGE).  3. I reviewed today's High resolution chest CT which confirmed calcification of mitral annular mass as above.  4.  No scar or late gadolinium enhancement noted on LV myocardium.  CT Chest Hi-Res 6/16 IMPRESSION: 1. Marked increase in upper/midlung zone predominant peribronchovascular ground-glass, interstitial thickening and scattered collapse/consolidation when compared with 03/21/2021. Findings are likely due to an atypical/viral pneumonia,  including  due to COVID-19. Acute drug toxicity is not excluded. Findings are suggestive of an alternative diagnosis (not UIP) per consensus guidelines: Diagnosis of Idiopathic Pulmonary Fibrosis: An Official ATS/ERS/JRS/ALAT Clinical Practice Guideline. [place], Iss 5, [phone], Jun 15 2017. 2. Trace right pleural fluid. 3. Aortic atherosclerosis (ICD10-I70.0). Coronary artery calcification. 4. Enlarged pulmonic trunk, indicative of pulmonary arterial hypertension.  Resolved Hospital Problem list     Assessment & Plan:  Acute hypoxemic respiratory failure secondary to interstitial lung disease, oxygen-dependent Alternative UIP per high-resolution CT chest 03/30/2021 - UL predominance Predominant lymphocytosis greater than 60% on BAL; this is typically consistent with methotrexate hypersensitive pneumonitis (anecdotally seen it more with Subcut) or feather pillow hypersensitive pneumonitis or combination; rarely it can be L IP from rheumatoid arthritis.  - Continue IV steroids - Continue ceftriaxone (end 6/22) - GI ppx with PPI (BID while c/f melena) - Wean supplemental O2 as able for O2 sat > 92% - Pulmonary hygiene, OOB - Reiterated need to dispose of feather-containing pillows/blankets - Advised no further methotrexate, will need to discuss alternatives with rheumatologist  Best Practice: (right click and "Reselect all SmartList Selections" daily)  Per Primary Team  Critical care time: N/A   Rhae Lerner [facility] 03/31/21 4:20 PM  Please see [email or web address] for pager details.  From 7A-7P if no response, please call [phone] After hours, please call ELink [phone]

## 2021-03-31 NOTE — Progress Notes (Signed)
[facility] for Infectious Disease    Date of Admission:  03/23/2021   Total days of antibiotics 9          ID: Lynn [name] is a 65 y.o. female with  fuo and shortness of breath  Principal Problem:   Acute respiratory failure with hypoxia ([place]) Active Problems:   Essential hypertension   Chronic diastolic CHF (congestive heart failure) (HCC)   Type 2 diabetes mellitus with hyperglycemia, with long-term current use of insulin (HCC)   Acute bronchitis   SOB (shortness of breath)   FUO (fever of unknown origin)   Hypokalemia   Generalized weakness   Acute bacterial endocarditis   ILD (interstitial lung disease) (HCC)    Subjective: Afebrile, underwent cardiac mri- showing stable MAC and chest CT --showing ILD. Comfortable on 7L [place]  Medications:   arformoterol  15 mcg Nebulization BID   atorvastatin  80 mg Oral q1800   diltiazem  240 mg Oral Daily   fluticasone  1 spray Each Nare Daily   insulin aspart  0-20 Units Subcutaneous TID WC   insulin aspart  0-5 Units Subcutaneous QHS   insulin aspart  9 Units Subcutaneous TID WC   insulin glargine  30 Units Subcutaneous BID   loratadine  10 mg Oral Daily   losartan  100 mg Oral Daily   methylPREDNISolone (SOLU-MEDROL) injection  40 mg Intravenous Q24H   multivitamin with minerals  1 tablet Oral Daily   pantoprazole (PROTONIX) IV  40 mg Intravenous Q12H   revefenacin  175 mcg Nebulization Daily   saccharomyces boulardii  250 mg Oral BID   sulfamethoxazole-trimethoprim  1 tablet Oral Q12H    Objective: Vital signs in last 24 hours: Temp:  [98 F (36.7 C)-98.3 F (36.8 C)] 98.2 F (36.8 C) (06/17 1434) Pulse Rate:  [66-79] 79 (06/17 1434) Resp:  [18-20] 20 (06/17 1434) BP: (156-177)/(63-97) 164/81 (06/17 1434) SpO2:  [95 %-96 %] 96 % (06/17 1434) Weight:  [107.2 kg] 107.2 kg (06/17 0455) Physical Exam  Constitutional:  oriented to person, place, and time. appears well-developed and well-nourished. No  distress.  HENT: Nectar/AT, PERRLA, no scleral icterus Mouth/Throat: Oropharynx is clear and moist. No oropharyngeal exudate.  Cardiovascular: Normal rate, regular rhythm and normal heart sounds. Exam reveals no gallop and no friction rub.  No murmur heard.  Pulmonary/Chest: Effort normal and breath sounds normal. No respiratory distress.  has no wheezes.  Lymphadenopathy: no cervical adenopathy. No axillary adenopathy Neurological: alert and oriented to person, place, and time.  Skin: Skin is warm and dry. No rash noted. No erythema.  Psychiatric: a normal mood and affect.  behavior is normal.    Lab Results Recent Labs    03/29/21 0342 03/30/21 0341 03/31/21 0431  WBC 25.6*  --  20.5*  HGB 7.6*  --  7.8*  HCT 26.0*  --  26.2*  NA 134*  --  136  K 5.0 4.3 4.5  CL 102  --  100  CO2 27  --  29  BUN 27*  --  24*  CREATININE 1.17*  --  1.17*   Liver Panel Recent Labs    03/29/21 0342  PROT 6.9  ALBUMIN 3.1*  AST 21  ALT 25  ALKPHOS 68  BILITOT 0.2*   Sedimentation Rate Recent Labs    03/31/21 0431  ESRSEDRATE 62*   C-Reactive Protein No results for input(s): CRP in the last 72 hours.  Microbiology: reviewed Studies/Results: CT Chest High  Resolution  Result Date: 03/30/2021 CLINICAL DATA:  Interstitial lung disease, lymphocytosis on bronchoalveolar lavage. Acute hypoxic respiratory failure. EXAM: CT CHEST WITHOUT CONTRAST TECHNIQUE: Multidetector CT imaging of the chest was performed following the standard protocol without intravenous contrast. High resolution imaging of the lungs, as well as inspiratory and expiratory imaging, was performed. COMPARISON:  03/21/2021. FINDINGS: Cardiovascular: Atherosclerotic calcification of the aorta, aortic valve and coronary arteries. Pulmonic trunk and heart are enlarged. No pericardial effusion. Mediastinum/Nodes: Numerous mediastinal lymph nodes, none of which are enlarged by CT size criteria. Hilar regions are difficult to evaluate  without IV contrast. No axillary adenopathy. Esophagus is grossly unremarkable. Lungs/Pleura: Interval development of worsening peribronchovascular ground-glass, interstitial thickening and scattered collapse/consolidation, upper and midlung zone predominant. No definite air trapping. There may be trace right pleural fluid. Airway is unremarkable. Upper Abdomen: Visualized portions of the liver, adrenal glands, kidneys, spleen, pancreas, stomach and bowel are grossly unremarkable. Upper abdominal lymph nodes are not enlarged by CT size criteria. Musculoskeletal: Degenerative changes in the spine. IMPRESSION: 1. Marked increase in upper/midlung zone predominant peribronchovascular ground-glass, interstitial thickening and scattered collapse/consolidation when compared with 03/21/2021. Findings are likely due to an atypical/viral pneumonia, including due to COVID-19. Acute drug toxicity is not excluded. Findings are suggestive of an alternative diagnosis (not UIP) per consensus guidelines: Diagnosis of Idiopathic Pulmonary Fibrosis: An Official ATS/ERS/JRS/ALAT Clinical Practice Guideline. [place], Iss 5, [phone], Jun 15 2017. 2. Trace right pleural fluid. 3. Aortic atherosclerosis (ICD10-I70.0). Coronary artery calcification. 4. Enlarged pulmonic trunk, indicative of pulmonary arterial hypertension. Electronically Signed   By: Lorin Picket M.D.   On: 03/30/2021 14:14   MR CARDIAC MORPHOLOGY W WO CONTRAST  Result Date: 03/30/2021 CLINICAL DATA:  Cardiac mass EXAM: CARDIAC MRI TECHNIQUE: The patient was scanned on a 1.5 Tesla GE magnet. A dedicated cardiac coil was used. Functional imaging was done using [place] sequences. 2,3, and 4 chamber views were done to assess for RWMA's. Modified Simpson's rule using a short axis stack was used to calculate an ejection fraction on a dedicated work Conservation officer, nature. The patient received 10 cc of Gadavist. After 10 minutes inversion  recovery sequences were used to assess for infiltration and scar tissue. CONTRAST:  10 cc  of Gadavist FINDINGS: 1. Normal left ventricular size, thickness and systolic function (LVEF = 53%). There are no regional wall motion abnormalities. There is no late gadolinium enhancement in the left ventricular myocardium. There is a 21 x 19 mm mass at the level of the mitral annulus, posterior lateral segment. The mass was hypointense on T1, T2-weighted and short tau inversion recovery sequences. The mass did not show any contrast uptake or late gadolinium enhancement highly suggestive of degenerative mitral annular calcification. LVEDV: 146 ml LVESV: 68 ml SV: 77 ml CO: 5L/min Myocardial mass: 113g 2. Normal right ventricular size, thickness and systolic function (RVEF = 48%). There are no regional wall motion abnormalities. 3.  Normal left and right atrial size. 4. Normal size of the aortic root, ascending aorta and pulmonary artery. 5.  Mild mitral regurgitation. 6.  Normal pericardium.  No pericardial effusion. IMPRESSION: 1.  Normal right and left ventricular size and function, LVEF 53%. 2. Posterolateral mitral annulus mass consistent with degenerative mitral annular calcification (hypointense on T1, T2 and STIR sequences, no LGE). 3. I reviewed today's High resolution chest CT which confirmed calcification of mitral annular mass as above. 4.  No scar or late gadolinium enhancement noted on  LV myocardium. Kate Sable Electronically Signed   By: Kate Sable M.D.   On: 03/30/2021 16:09     Assessment/Plan: FUO with pulmonary symptoms found to have ILD- with bronchoscopy showing lymphocytosis - this is thought to be possible hypersensitivity pneumonitis. Will discontinue abtx. Pulmonary plan to keep her on high dose steroids for the next 6 wk. Per patient report.   If she is to have steroids( pred 64m + for greater than 2 months) would recommend to give her bactrim ds daily for oi proph. Will sign  off.    [facility] Cell: [phone]: [phone]  03/31/2021, 4:59 PM

## 2021-03-31 NOTE — Progress Notes (Signed)
PROGRESS NOTE  Lynn [name] FUX:[id number] DOB: Nov 26, 1955   PCP: [name], MD  Patient is from: Home  DOA: 03/23/2021 LOS: 8  Chief complaints:  Chief Complaint  Patient presents with   Fever   Cough   Nausea   Fatigue     Brief Narrative / Interim history: 65 year old F with PMH of RA on MTX, diastolic CHF, HTN, HLD, DM-2 and thalassemia presenting with fever, shortness of breath and dry cough that did not improve with p.o. doxycycline outpatient.  Patient was febrile but hemodynamically stable.  His Pro-Cal is low.  She completed antibiotic course for CAP coverage but continues to spike fever.  D-dimer was elevated but CTA negative for PE or any acute finding.  CRP and ESR elevated and trended up.  Full RVP panel, COVID-19, blood culture and BAL cultures negative.  Infectious disease consulted.  TTE with ill-defined 3.23 x 2.1 cm mitral valve density.  Cardiology consulted and TEE concerning for large circumscribed mass adherent to the posterior annulus of MV measuring 1.8 x 1.4 cm, as well as moderate to severe mitral stenosis.  She underwent cardiac MRI and high-resolution which showed posterolateral mitral annulus mass consistent with degenerative mitral annular calcification.  She also had CT abdomen and pelvis that was unrevealing.  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  Pulmonology consulted and she had a BAL which is consistent lymphocytosis suggesting MTX or feather-pillow related pneumonitis.  Patient also had positive Hemoccult.  GI consulted.  Subjective: Seen and examined earlier this morning.  No major events overnight of this morning.  She reports melena for 2 days.  She reports taking ibuprofen and Aleve occasionally.  She denies hematochezia.  She denies chest pain.  Breathing has improved.  Objective: Vitals:   03/31/21 0455 03/31/21 0843 03/31/21 1308 03/31/21 1434  BP:   (!) 177/97 (!) 164/81  Pulse:   66 79  Resp:    20  Temp:     98.2 F (36.8 C)  TempSrc:    Oral  SpO2:  96%  96%  Weight: 107.2 kg     Height:        Intake/Output Summary (Last 24 hours) at 03/31/2021 1454 Last data filed at 03/31/2021 0600 Gross per 24 hour  Intake 250.3 ml  Output 601 ml  Net -350.7 ml   Filed Weights   03/29/21 0420 03/30/21 0356 03/31/21 0455  Weight: 109.4 kg 108.2 kg 107.2 kg    Examination:  GENERAL: No apparent distress.  Nontoxic. HEENT: MMM.  Vision and hearing grossly intact.  NECK: Supple.  No apparent JVD.  RESP:  No IWOB.  Fair aeration bilaterally. CVS:  RRR. Heart sounds normal.  ABD/GI/GU: BS+. Abd soft, NTND.  MSK/EXT:  Moves extremities. No apparent deformity. No edema.  SKIN: no apparent skin lesion or wound NEURO: Awake, alert and oriented appropriately.  No apparent focal neuro deficit. PSYCH: Calm. Normal affect.   Procedures:  6/14-bronchial lavage 6/14-TEE  Microbiology summarized: COVID-19 and influenza PCR nonreactive. Full RVP panel negative. MRSA PCR screen negative. Blood cultures negative. Urine culture with insignificant growth. AFB smear negative.  Assessment & Plan: Acute respiratory failure with hypoxia-unclear etiology despite extensive work-up as above but concern for pneumonitis related to MTX and feather pillow.  -Follow BAL fungal cultures and acid-fast cultures. -Continue IV Solu-Medrol and Bactrim -Continue breathing treatments. -PCCM following.  Fever of unknown origin: Unclear source of this.  Due to pneumonitis? -Completed CAP coverage.  Also received vancomycin 6/11-03/2014. -  Antibiotics as above  Cardiac mass: Noted on TTE and TEE but cardiac MRI and high-resolution CT consistent with degenerative mitral annular calcification.  Moderate to severe mitral stenosis -Follow-up with cardiology.   History of nonobstructive CAD/chronic diastolic CHF: Stable.  TTE and TEE reassuring.  Appears euvolemic. -Monitor fluid status and respiratory status    Essential hypertension: BP slightly elevated. -Continue losartan and diltiazem -Add low-dose hydralazine.     IDDM-2 with hyperglycemia: Hyperglycemia likely due to steroid. Recent Labs  Lab 03/31/21 0016 03/31/21 0444 03/31/21 0732 03/31/21 1138 03/31/21 1617  GLUCAP 349* 274* 262* 267* 272*  -Increase Lantus from 45 at night to 30 units twice daily -Continue SSI-high with night coverage. -Continue NovoLog 9 units 3 times daily with meals -Further adjustment as appropriate.   Microcytic anemia/thalassemia: H&H relatively stable after initial drop.  Hemoccult positive. Recent Labs    06/02/20 0000 08/25/20 0000 02/22/21 0000 03/20/21 1220 03/23/21 1301 03/26/21 0402 03/27/21 0351 03/28/21 0333 03/29/21 0342 03/31/21 0431  HGB 8.7* 9.2* 9.6* 9.5* 9.3* 8.1* 8.0* 8.1* 7.6* 7.8*  -GI consulted -Monitor H&H. -SCD for VTE prophylaxis.     Paroxysmal atrial tachycardia/paroxysmal A. fib with RVR: RVR resolved. -Continue Cardizem -No anticoagulation in the setting of positive Hemoccult/GI bleed. . At risk for sleep apnea -Needs outpatient sleep study.  History of rheumatoid arthritis: On methotrexate at home. Concerned about pneumonitis with MTX -Continue steroid as above.  Debility/generalized weakness -PT/OT.  Morbid obesity Body mass index is 40.57 kg/m.  -Encourage lifestyle change to lose weight.       DVT prophylaxis:  Place and maintain sequential compression device Start: 03/30/21 1930 SCDs Start: 03/23/21 2149  Code Status: Full code Family Communication: Patient and/or RN. Available if any question.  Level of care: Telemetry Status is: Inpatient  Remains inpatient appropriate because:Ongoing diagnostic testing needed not appropriate for outpatient work up, IV treatments appropriate due to intensity of illness or inability to take PO, and Inpatient level of care appropriate due to severity of illness  Dispo: The patient is from: Home               Anticipated d/c is to: Home              Patient currently is not medically stable to d/c.   Difficult to place patient No       Consultants:  Pulmonology Infectious disease Cardiology Gastroenterology   Sch Meds:  Scheduled Meds:  arformoterol  15 mcg Nebulization BID   atorvastatin  80 mg Oral q1800   diltiazem  240 mg Oral Daily   fluticasone  1 spray Each Nare Daily   insulin aspart  0-20 Units Subcutaneous TID WC   insulin aspart  0-5 Units Subcutaneous QHS   insulin aspart  9 Units Subcutaneous TID WC   insulin glargine  30 Units Subcutaneous BID   loratadine  10 mg Oral Daily   losartan  100 mg Oral Daily   methylPREDNISolone (SOLU-MEDROL) injection  40 mg Intravenous Q24H   multivitamin with minerals  1 tablet Oral Daily   pantoprazole (PROTONIX) IV  40 mg Intravenous Q12H   revefenacin  175 mcg Nebulization Daily   saccharomyces boulardii  250 mg Oral BID   sulfamethoxazole-trimethoprim  1 tablet Oral Q12H   Continuous Infusions:  cefTRIAXone (ROCEPHIN)  IV 2 g (03/31/21 1449)   lactated ringers     PRN Meds:.acetaminophen **OR** acetaminophen, albuterol, guaiFENesin-dextromethorphan, HYDROcodone bit-homatropine, ondansetron (ZOFRAN) IV  Antimicrobials: Anti-infectives (From admission, onward)  Start     Dose/Rate Route Frequency Ordered Stop   03/30/21 1407  cefTRIAXone (ROCEPHIN) 2 g in sodium chloride 0.9 % 100 mL IVPB        2 g 200 mL/hr over 30 Minutes Intravenous Every 24 hours 03/30/21 0909 04/06/21 1359   03/30/21 1300  sulfamethoxazole-trimethoprim (BACTRIM DS) 800-160 MG per tablet 1 tablet        1 tablet Oral Every 12 hours 03/30/21 1207     03/26/21 1600  vancomycin (VANCOREADY) IVPB 1000 mg/200 mL  Status:  Discontinued        1,000 mg 200 mL/hr over 60 Minutes Intravenous Every 24 hours 03/25/21 1514 03/29/21 1556   03/25/21 1700  cefTRIAXone (ROCEPHIN) 2 g in sodium chloride 0.9 % 100 mL IVPB  Status:  Discontinued        2 g 200  mL/hr over 30 Minutes Intravenous Every 12 hours 03/25/21 1429 03/30/21 0909   03/25/21 1600  vancomycin (VANCOCIN) 2,250 mg in sodium chloride 0.9 % 500 mL IVPB        2,250 mg 250 mL/hr over 120 Minutes Intravenous STAT 03/25/21 1503 03/25/21 1815   03/25/21 1515  cefTRIAXone (ROCEPHIN) 1 g in sodium chloride 0.9 % 100 mL IVPB  Status:  Discontinued        1 g 200 mL/hr over 30 Minutes Intravenous Every 24 hours 03/25/21 1421 03/25/21 1429   03/24/21 1530  azithromycin (ZITHROMAX) tablet 500 mg  Status:  Discontinued        500 mg Oral Daily 03/24/21 1525 03/28/21 0942   03/24/21 0145  doxycycline (VIBRA-TABS) tablet 100 mg  Status:  Discontinued       Note to Pharmacy: Patient taking differently: Start date : 03/20/21     100 mg Oral 2 times daily 03/24/21 0131 03/24/21 1038        I have personally reviewed the following labs and images: CBC: Recent Labs  Lab 03/26/21 0402 03/27/21 0351 03/28/21 0333 03/29/21 0342 03/31/21 0431  WBC 14.6* 18.0* 30.2* 25.6* 20.5*  HGB 8.1* 8.0* 8.1* 7.6* 7.8*  HCT 27.3* 27.2* 27.6* 26.0* 26.2*  MCV 63.0* 63.6* 63.7* 64.5* 62.4*  PLT 349 361 412* 370 338   BMP &GFR Recent Labs  Lab 03/27/21 0351 03/27/21 1132 03/28/21 0333 03/29/21 0342 03/30/21 0341 03/31/21 0431  NA 133* 133* 133* 134*  --  136  K 4.7 4.3 4.9 5.0 4.3 4.5  CL 99 97* 99 102  --  100  CO2 _0 --  29  GLUCOSE 393* 467* 379* 303*  --  295*  BUN 19 22 25* 27*  --  24*  CREATININE 1.23* 1.11* 1.16* 1.17*  --  1.17*  CALCIUM 9.1 9.4 9.3 8.7*  --  8.9   Estimated Creatinine Clearance: 55.8 mL/min (A) (by C-G formula based on SCr of 1.17 mg/dL (H)). Liver & Pancreas: Recent Labs  Lab 03/26/21 0402 03/27/21 0351 03/28/21 0333 03/29/21 0342  AST _1 ALT _2 ALKPHOS 62 68 84 68  BILITOT 0.7 0.6 0.2* 0.2*  PROT 7.1 7.5 7.6 6.9  ALBUMIN 3.2* 3.1* 3.3* 3.1*   No results for input(s): LIPASE, AMYLASE in the last 168 hours. No results for  input(s): AMMONIA in the last 168 hours. Diabetic: No results for input(s): HGBA1C in the last 72 hours. Recent Labs  Lab 03/30/21 2155 03/31/21 0016 03/31/21 0444 03/31/21 0732 03/31/21 1138  GLUCAP 302* 349*  274* 262* 267*   Cardiac Enzymes: Recent Labs  Lab 03/25/21 1126  CKTOTAL 213   Recent Labs    03/20/21 1220  PROBNP 57.0   Coagulation Profile: No results for input(s): INR, PROTIME in the last 168 hours. Thyroid Function Tests: No results for input(s): TSH, T4TOTAL, FREET4, T3FREE, THYROIDAB in the last 72 hours. Lipid Profile: No results for input(s): CHOL, HDL, LDLCALC, TRIG, CHOLHDL, LDLDIRECT in the last 72 hours. Anemia Panel: No results for input(s): VITAMINB12, FOLATE, FERRITIN, TIBC, IRON, RETICCTPCT in the last 72 hours. Urine analysis:    Component Value Date/Time   COLORURINE YELLOW 03/23/2021 1732   APPEARANCEUR HAZY (A) 03/23/2021 1732   LABSPEC 1.017 03/23/2021 1732   PHURINE 5.0 03/23/2021 1732   GLUCOSEU NEGATIVE 03/23/2021 1732   HGBUR SMALL (A) 03/23/2021 1732   BILIRUBINUR NEGATIVE 03/23/2021 1732   KETONESUR NEGATIVE 03/23/2021 1732   PROTEINUR NEGATIVE 03/23/2021 1732   UROBILINOGEN 0.2 08/18/2008 1535   NITRITE NEGATIVE 03/23/2021 1732   LEUKOCYTESUR TRACE (A) 03/23/2021 1732   Sepsis Labs: Invalid input(s): PROCALCITONIN, [place]  Microbiology: Recent Results (from the past 240 hour(s))  Resp Panel by RT-PCR (Flu A&B, Covid) Nasopharyngeal Swab     Status: None   Collection Time: 03/23/21  5:32 PM   Specimen: Nasopharyngeal Swab; Nasopharyngeal(NP) swabs in vial transport medium  Result Value [place] Status   SARS Coronavirus 2 by RT PCR NEGATIVE NEGATIVE Final    Comment: (NOTE) SARS-CoV-2 target nucleic acids are NOT DETECTED.  The SARS-CoV-2 RNA is generally detectable in upper respiratory specimens during the acute phase of infection. The lowest concentration of SARS-CoV-2 viral copies this assay can detect is 138  copies/mL. A negative result does not preclude SARS-Cov-2 infection and should not be used as the sole basis for treatment or other patient management decisions. A negative result may occur with  improper specimen collection/handling, submission of specimen other than nasopharyngeal swab, presence of viral mutation(s) within the areas targeted by this assay, and inadequate number of viral copies(<138 copies/mL). A negative result must be combined with clinical observations, patient history, and epidemiological information. The expected result is Negative.  Fact Sheet for Patients:  EntrepreneurPulse.com.au  Fact Sheet for Healthcare Providers:  IncredibleEmployment.be  This test is no t yet approved or cleared by the [place] FDA and  has been authorized for detection and/or diagnosis of SARS-CoV-2 by FDA under an Emergency Use Authorization (EUA). This EUA will remain  in effect (meaning this test can be used) for the duration of the COVID-19 declaration under Section 564(b)(1) of the Act, 21 U.S.C.section 360bbb-3(b)(1), unless the authorization is terminated  or revoked sooner.       Influenza A by PCR NEGATIVE NEGATIVE Final   Influenza B by PCR NEGATIVE NEGATIVE Final    Comment: (NOTE) The Xpert Xpress SARS-CoV-2/FLU/RSV plus assay is intended as an aid in the diagnosis of influenza from Nasopharyngeal swab specimens and should not be used as a sole basis for treatment. Nasal washings and aspirates are unacceptable for Xpert Xpress SARS-CoV-2/FLU/RSV testing.  Fact Sheet for Patients: EntrepreneurPulse.com.au  Fact Sheet for Healthcare Providers: IncredibleEmployment.be  This test is not yet approved or cleared by the [place] FDA and has been authorized for detection and/or diagnosis of SARS-CoV-2 by FDA under an Emergency Use Authorization (EUA). This EUA will remain in effect (meaning  this test can be used) for the duration of the COVID-19 declaration under Section 564(b)(1) of the Act, 21 U.S.C. section 360bbb-3(b)(1), unless  the authorization is terminated or revoked.  Performed at [facility], [place] [address], [place], [place] [address]   Urine culture     Status: Abnormal   Collection Time: 03/23/21  5:32 PM   Specimen: Urine, Clean Catch  Result Value [place] Status   Specimen Description   Final    URINE, CLEAN CATCH Performed at [facility], [place] [address], [place], [place] [address]    Special Requests   Final    NONE Performed at [facility], [place] [address] [place], [place] [address]    Culture (A)  Final    <10,000 COLONIES/mL INSIGNIFICANT GROWTH Performed at [place] [address] [place], [place]  [address]    Report Status 03/25/2021 FINAL  Final  Culture, blood (Routine X 2) w Reflex to ID Panel     Status: None   Collection Time: 03/24/21  2:25 AM   Specimen: BLOOD  Result Value [place] Status   Specimen Description   Final    BLOOD BLOOD LEFT FOREARM Performed at [place] [address], [place], [place] [address]    Special Requests   Final    BOTTLES DRAWN AEROBIC AND ANAEROBIC Blood Culture adequate volume Performed at [place] [address] [place], [place] [address]    Culture   Final    NO GROWTH 5 DAYS Performed at [facility], [place] [address], [place], [place] [address]    Report Status 03/29/2021 FINAL  Final  Culture, blood (Routine X 2) w Reflex to ID Panel     Status: None   Collection Time: 03/24/21  2:30 AM   Specimen: BLOOD  Result Value [place] Status   Specimen Description   Final    BLOOD BLOOD RIGHT HAND Performed at [place] [address], [place], [place] [address]    Special Requests   Final    BOTTLES DRAWN AEROBIC AND ANAEROBIC Blood Culture adequate  volume Performed at [place] [address], [place], [place] [address]    Culture   Final    NO GROWTH 5 DAYS Performed at [facility], [place] [address] [place], [place] [address]    Report Status 03/29/2021 FINAL  Final  Culture, blood (routine x 2)     Status: None   Collection Time: 03/24/21 11:30 AM   Specimen: BLOOD  Result Value [place] Status   Specimen Description   Final    BLOOD LEFT ANTECUBITAL Performed at [facility], [place] [address], [place], [place] [address]    Special Requests   Final    BOTTLES DRAWN AEROBIC AND ANAEROBIC Blood Culture adequate volume Performed at [place] [address] [place], [place] [address]    Culture   Final    NO GROWTH 5 DAYS Performed at [facility], [place] [address] [place], [place] [address]    Report Status 03/29/2021 FINAL  Final  MRSA PCR Screening     Status: None   Collection Time: 03/25/21  1:27 PM   Specimen: Nasal Mucosa; Nasopharyngeal  Result Value [place] Status   MRSA by PCR NEGATIVE NEGATIVE Final    Comment:        The GeneXpert MRSA Assay (FDA approved for NASAL specimens only), is one component of a comprehensive MRSA colonization surveillance program. It is not intended to diagnose MRSA  infection nor to guide or monitor treatment for MRSA infections. Performed at [facility], [place] [address], [place], [place] [address]   Respiratory (~20 pathogens) panel by PCR     Status: None   Collection Time: 03/26/21  7:50 AM  Result Value [place] Status   Adenovirus NOT DETECTED NOT DETECTED Final   Coronavirus 229E NOT DETECTED NOT DETECTED Final    Comment: (NOTE) The Coronavirus on the Respiratory Panel, DOES NOT test for the novel  Coronavirus (2019 nCoV)    Coronavirus HKU1 NOT DETECTED NOT DETECTED Final   Coronavirus NL63 NOT DETECTED NOT DETECTED Final   Coronavirus OC43 NOT DETECTED NOT DETECTED Final    Metapneumovirus NOT DETECTED NOT DETECTED Final   Rhinovirus / Enterovirus NOT DETECTED NOT DETECTED Final   Influenza A NOT DETECTED NOT DETECTED Final   Influenza B NOT DETECTED NOT DETECTED Final   Parainfluenza Virus 1 NOT DETECTED NOT DETECTED Final   Parainfluenza Virus 2 NOT DETECTED NOT DETECTED Final   Parainfluenza Virus 3 NOT DETECTED NOT DETECTED Final   Parainfluenza Virus 4 NOT DETECTED NOT DETECTED Final   Respiratory Syncytial Virus NOT DETECTED NOT DETECTED Final   Bordetella pertussis NOT DETECTED NOT DETECTED Final   Bordetella Parapertussis NOT DETECTED NOT DETECTED Final   Chlamydophila pneumoniae NOT DETECTED NOT DETECTED Final   Mycoplasma pneumoniae NOT DETECTED NOT DETECTED Final    Comment: Performed at [facility], [place]. [address], [place], [place] [address]  Culture, Respiratory w Gram Stain     Status: None   Collection Time: 03/28/21  2:10 PM   Specimen: Bronchoalveolar Lavage; Respiratory  Result Value [place] Status   Specimen Description BRONCHIAL ALVEOLAR LAVAGE  Final   Special Requests NONE  Final   Gram Stain   Final    FEW WBC PRESENT, PREDOMINANTLY MONONUCLEAR NO ORGANISMS SEEN    Culture   Final    NO GROWTH 2 DAYS Performed at [facility], [address] [place], [place] [address]    Report Status 03/31/2021 FINAL  Final  Acid Fast Smear (AFB)     Status: None   Collection Time: 03/28/21  2:10 PM   Specimen: Bronchial Alveolar Lavage  Result Value Ref [place] Status   AFB Specimen Processing Concentration  Final   Acid Fast Smear Negative  Final    Comment: (NOTE) Performed At: [facility] [place], [place] [id number] Rush Farmer MD KC:[id number]    Source (AFB) BRONCHIAL ALVEOLAR LAVAGE  Final    Comment: RML Performed at [facility], [place] [address], [place], [place] [address]     Radiology Studies: No results found.    Shana Zavaleta T. [place]  If 7PM-7AM, please  contact night-coverage [email or web address] 03/31/2021, 2:54 PM

## 2021-03-31 NOTE — Progress Notes (Addendum)
Progress Note  Patient Name: Lynn [name] Date of Encounter: 03/31/2021  Primary Cardiologist: Glenetta Hew, MD  Subjective   Feeling good, no CP or SOB. Remains on O2. FOBT+ overnight. Reports dark stools x 2 days, initially like "ground up old grass" then very dark last night. No frank bleeding. Had 4 minutes of afib yesterday - asymptomatic.  Inpatient Medications    Scheduled Meds:  arformoterol  15 mcg Nebulization BID   atorvastatin  80 mg Oral q1800   diltiazem  60 mg Oral Q8H   fluticasone  1 spray Each Nare Daily   insulin aspart  0-20 Units Subcutaneous TID WC   insulin aspart  0-5 Units Subcutaneous QHS   insulin aspart  9 Units Subcutaneous TID WC   insulin glargine  30 Units Subcutaneous BID   loratadine  10 mg Oral Daily   losartan  100 mg Oral Daily   methylPREDNISolone (SOLU-MEDROL) injection  40 mg Intravenous Q24H   multivitamin with minerals  1 tablet Oral Daily   pantoprazole  40 mg Oral Daily   revefenacin  175 mcg Nebulization Daily   saccharomyces boulardii  250 mg Oral BID   sulfamethoxazole-trimethoprim  1 tablet Oral Q12H   Continuous Infusions:  cefTRIAXone (ROCEPHIN)  IV 2 g (03/30/21 1407)   lactated ringers     PRN Meds: acetaminophen **OR** acetaminophen, albuterol, guaiFENesin-dextromethorphan, HYDROcodone bit-homatropine, ondansetron (ZOFRAN) IV   Vital Signs    Vitals:   03/30/21 2127 03/31/21 0448 03/31/21 0455 03/31/21 0843  BP:  (!) 162/82    Pulse:  68    Resp:  18    Temp:  98 F (36.7 C)    TempSrc:      SpO2: 95% 95%  96%  Weight:   107.2 kg   Height:        Intake/Output Summary (Last 24 hours) at 03/31/2021 0912 Last data filed at 03/31/2021 0600 Gross per 24 hour  Intake 610.3 ml  Output 601 ml  Net 9.3 ml   Last 3 Weights 03/31/2021 03/30/2021 03/29/2021  Weight (lbs) 236 lb 5.3 oz 238 lb 8.6 oz 241 lb 2.9 oz  Weight (kg) 107.2 kg 108.2 kg 109.4 kg     Telemetry    NSR, brief episode of atrial  fib yesterday afternoon for 4 minutes, no acute range change - Personally Reviewed  Physical Exam   GEN: No acute distress.  HEENT: Normocephalic, atraumatic, sclera non-icteric. Neck: No JVD or bruits. Cardiac: RRR no murmurs, rubs, or gallops.  Respiratory: Clear to auscultation bilaterally. Breathing is unlabored. GI: Soft, nontender, non-distended, BS +x 4. MS: no deformity. Extremities: No clubbing or cyanosis. No edema. Distal pedal pulses are 2+ and equal bilaterally. Neuro:  AAOx3. Follows commands. Psych:  Responds to questions appropriately with a normal affect.  Labs    High Sensitivity Troponin:  No results for input(s): TROPONINIHS in the last 720 hours.    Cardiac EnzymesNo results for input(s): TROPONINI in the last 168 hours. No results for input(s): TROPIPOC in the last 168 hours.   Chemistry Recent Labs  Lab 03/27/21 0351 03/27/21 1132 03/28/21 0333 03/29/21 0342 03/30/21 0341 03/31/21 0431  NA 133*   < > 133* 134*  --  136  K 4.7   < > 4.9 5.0 4.3 4.5  CL 99   < > 99 102  --  100  CO2 27   < > 28 27  --  29  GLUCOSE 393*   < >  379* 303*  --  295*  BUN 19   < > 25* 27*  --  24*  CREATININE 1.23*   < > 1.16* 1.17*  --  1.17*  CALCIUM 9.1   < > 9.3 8.7*  --  8.9  PROT 7.5  --  7.6 6.9  --   --   ALBUMIN 3.1*  --  3.3* 3.1*  --   --   AST 18  --  18 21  --   --   ALT 18  --  22 25  --   --   ALKPHOS 68  --  84 68  --   --   BILITOT 0.6  --  0.2* 0.2*  --   --   GFRNONAA 48*   < > 52* 51*  --  51*  ANIONGAP 7   < > 6 5  --  7   < > = values in this interval not displayed.     Hematology Recent Labs  Lab 03/28/21 0333 03/29/21 0342 03/31/21 0431  WBC 30.2* 25.6* 20.5*  RBC 4.33 4.03 4.20  HGB 8.1* 7.6* 7.8*  HCT 27.6* 26.0* 26.2*  MCV 63.7* 64.5* 62.4*  MCH 18.7* 18.9* 18.6*  MCHC 29.3* 29.2* 29.8*  RDW 18.6* 18.6* 18.7*  PLT 412* 370 338    BNP Recent Labs  Lab 03/26/21 1457  BNP 152.5*     DDimer  Recent Labs  Lab  03/26/21 1433 03/27/21 0351  DDIMER 2.23* 2.65*     Radiology    CT Chest High Resolution  Result Date: 03/30/2021 CLINICAL DATA:  Interstitial lung disease, lymphocytosis on bronchoalveolar lavage. Acute hypoxic respiratory failure. EXAM: CT CHEST WITHOUT CONTRAST TECHNIQUE: Multidetector CT imaging of the chest was performed following the standard protocol without intravenous contrast. High resolution imaging of the lungs, as well as inspiratory and expiratory imaging, was performed. COMPARISON:  03/21/2021. FINDINGS: Cardiovascular: Atherosclerotic calcification of the aorta, aortic valve and coronary arteries. Pulmonic trunk and heart are enlarged. No pericardial effusion. Mediastinum/Nodes: Numerous mediastinal lymph nodes, none of which are enlarged by CT size criteria. Hilar regions are difficult to evaluate without IV contrast. No axillary adenopathy. Esophagus is grossly unremarkable. Lungs/Pleura: Interval development of worsening peribronchovascular ground-glass, interstitial thickening and scattered collapse/consolidation, upper and midlung zone predominant. No definite air trapping. There may be trace right pleural fluid. Airway is unremarkable. Upper Abdomen: Visualized portions of the liver, adrenal glands, kidneys, spleen, pancreas, stomach and bowel are grossly unremarkable. Upper abdominal lymph nodes are not enlarged by CT size criteria. Musculoskeletal: Degenerative changes in the spine. IMPRESSION: 1. Marked increase in upper/midlung zone predominant peribronchovascular ground-glass, interstitial thickening and scattered collapse/consolidation when compared with 03/21/2021. Findings are likely due to an atypical/viral pneumonia, including due to COVID-19. Acute drug toxicity is not excluded. Findings are suggestive of an alternative diagnosis (not UIP) per consensus guidelines: Diagnosis of Idiopathic Pulmonary Fibrosis: An Official ATS/ERS/JRS/ALAT Clinical Practice Guideline. [place], Iss 5, [phone], Jun 15 2017. 2. Trace right pleural fluid. 3. Aortic atherosclerosis (ICD10-I70.0). Coronary artery calcification. 4. Enlarged pulmonic trunk, indicative of pulmonary arterial hypertension. Electronically Signed   By: Lorin Picket M.D.   On: 03/30/2021 14:14   MR CARDIAC MORPHOLOGY W WO CONTRAST  Result Date: 03/30/2021 CLINICAL DATA:  Cardiac mass EXAM: CARDIAC MRI TECHNIQUE: The patient was scanned on a 1.5 Tesla GE magnet. A dedicated cardiac coil was used. Functional imaging was done using Fiesta sequences. 2,3, and  4 chamber views were done to assess for RWMA's. Modified Simpson's rule using a short axis stack was used to calculate an ejection fraction on a dedicated work Conservation officer, nature. The patient received 10 cc of Gadavist. After 10 minutes inversion recovery sequences were used to assess for infiltration and scar tissue. CONTRAST:  10 cc  of Gadavist FINDINGS: 1. Normal left ventricular size, thickness and systolic function (LVEF = 53%). There are no regional wall motion abnormalities. There is no late gadolinium enhancement in the left ventricular myocardium. There is a 21 x 19 mm mass at the level of the mitral annulus, posterior lateral segment. The mass was hypointense on T1, T2-weighted and short tau inversion recovery sequences. The mass did not show any contrast uptake or late gadolinium enhancement highly suggestive of degenerative mitral annular calcification. LVEDV: 146 ml LVESV: 68 ml SV: 77 ml CO: 5L/min Myocardial mass: 113g 2. Normal right ventricular size, thickness and systolic function (RVEF = 48%). There are no regional wall motion abnormalities. 3.  Normal left and right atrial size. 4. Normal size of the aortic root, ascending aorta and pulmonary artery. 5.  Mild mitral regurgitation. 6.  Normal pericardium.  No pericardial effusion. IMPRESSION: 1.  Normal right and left ventricular size and function, LVEF 53%. 2.  Posterolateral mitral annulus mass consistent with degenerative mitral annular calcification (hypointense on T1, T2 and STIR sequences, no LGE). 3. I reviewed today's High resolution chest CT which confirmed calcification of mitral annular mass as above. 4.  No scar or late gadolinium enhancement noted on LV myocardium. Kate Sable Electronically Signed   By: Kate Sable M.D.   On: 03/30/2021 16:09    Cardiac Studies   03/28/21 TEE  IMPRESSIONS     1. Left ventricular ejection fraction, by estimation, is 55 to 60%. The  left ventricle has normal function. The left ventricle has no regional  wall motion abnormalities.   2. Right ventricular systolic function was not well visualized. The right  ventricular size is not well visualized.   3. No left atrial/left atrial appendage thrombus was detected.   4. 3-D images of the mitral annular mass were obtained. There is a large  circumscribed mass adherent to the posterior annulus. The posterior  leaflet is fixed . There is moderate - severe mitral stenosis.      The mass is large , measuring 1.8 x 1.4 cm. Differential diagnosis  includes myxoma, exuberant mitral annular calcification, myxoma, or other  primary cardiac tumor. suggest Cardiac MRI for further evaluation . This  mass was present on transthoracic  echo from 2018. . The mitral valve is abnormal. Mild mitral valve  regurgitation. Moderate to severe mitral stenosis. Severe mitral annular  calcification.   5. The aortic valve is tricuspid. Aortic valve regurgitation is not  visualized. No aortic stenosis is present.   6. There is mild (Grade II) plaque.   FINDINGS   Left Ventricle: Left ventricular ejection fraction, by estimation, is 55  to 60%. The left ventricle has normal function. The left ventricle has no  regional wall motion abnormalities. The left ventricular internal cavity  size was normal in size.   Mitral Valve: 3-D images of the mitral annular mass were  obtained. There  is a large circumscribed mass adherent to the posterior annulus. The  posterior leaflet is fixed . There is moderate - severe mitral stenosis.  The mass is large , measuring 1.8 x 1.4 cm. Differential diagnosis  includes myxoma, exuberant  mitral annular calcification, myxoma, or other  primary cardiac tumor. suggest Cardiac MRI for further evaluation . This  mass was present on transthoracic echo  from 2018. The mitral valve is abnormal. Severe mitral annular  calcification. Mild mitral valve regurgitation. Moderate to severe mitral  valve stenosis. MV peak gradient, 19.9 mmHg. The mean mitral valve  gradient is 14.0 mmHg.   2D echo 03/2021    1. Left ventricular ejection fraction, by estimation, is 60 to 65%. The  left ventricle has normal function. The left ventricle has no regional  wall motion abnormalities. There is moderate left ventricular hypertrophy  of the basal-septal segment. Left  ventricular diastolic parameters are consistent with Grade II diastolic  dysfunction (pseudonormalization). Elevated left ventricular end-diastolic  pressure.   2. Right ventricular systolic function is normal. The right ventricular  size is normal. There is mildly elevated pulmonary artery systolic  pressure. The estimated right ventricular systolic pressure is 46.9 mmHg.   3. Left atrial size was mildly dilated.   4. The mitral valve is abnormal. There is an ill defined density  measuring 3.23 x 2.10cm that appears to originate off the posterior MV  leaflet but cannot be definite on this. There is also mitral annular  calcification present. There is moderate mitral  stenosis with a mean MVG of 65mHg. No evidence of mitral valve  regurgitation.   5. The aortic valve is normal in structure. Aortic valve regurgitation is  not visualized. No aortic stenosis is present.   6. The inferior vena cava is normal in size with greater than 50%  respiratory variability, suggesting right  atrial pressure of 3 mmHg.   7. Recommend TEE for possible mitral valve endocarditis in setting of  abnormal mitral valve and fevers.   LHC 2018 Moderate to heavy coronary calcification. Segmental 40-50% LAD narrowing. 30-50% proximal to mid RCA. Coronaries are widely patent without high-grade stenosis. Fluoroscopy demonstration of inferolateral calcified mass, likely representing mitral annular calcification. Consider CT or MRI if there is not heavy mitral valve calcification on echocardiography. Spontaneous runs of supraventricular tachycardia during the procedure.     RECOMMENDATIONS:   No significant obstructive coronary disease is identified. Etiology of enzyme elevation and clinical presentation could potentially be supraventricular arrhythmia superimposed on the cardiac substrate noted above.  Patient Profile     65y.o. female with a hx of RA, CAD, presumed coronary vasospasm, mitral stenosis (prior mitral valve abnormality identified), anxiety, stomach ulcers, HTN, HLD, former tobacco use, microcytic anemia, OSA, probable CKD stage IIIa by labs, seizures, thalassemia, DM2, PAT, RA. Presented with fever of unknown origin, hypoxic respiratory failure and cough. Found to have mitral valve mass, not felt to reflect endocarditis. Also had brief period of atrial fib. IM/ID are following for cause.  Assessment & Plan    1. Fever of unknown origin with acute respiratory failure and interstitial changes on lung imaging - 2D echo raised question of possible mitral valve endocarditis, described in an area previously known to have opacification by fluoroscopy in 2018 and prior echocardiogram -TEE 03/28/21 showed large mass adherent to posterior annulus, fixed posterior leaflet, moderate-severe MR -> recommended cardiac MRI as outlined below - NOT felt to reflect endocarditis - Bronch with CCM done - path negative for malignancy, >60% lymphocytes - High res CT 03/30/21 bilateral upper lobe  infiltrates groundglass opacities felt worse than a few days ago.  Alternative pattern to UIP per ATS criteria - > per pulm, "this is typically consistent with methotrexate hypersensitive  pneumonitis (anecdotally seen it more with Subcut) or feather pillow hypersensitive pneumonitis or combination.  Rarely it can be L IP from rheumatoid arthritis" - Has been treated with steroids, abx - ID also following    2. Newly recognized atrial fibrillation - transient - 3 hours on 03/26/21, 4 minutes on 03/30/21 - not on [place] prior to admission - CHADSVASc 5 but episode was very brief (not on ASA PTA - note hx of stomach ulcers remotely, also question of dural AVF on MRI, also +FOBT/dark stools ) for short episode af a fib will hold anticoagulation at this time, but continue to monitor on telemetry - TSH wnl - previously on diltiazem 259m at home, getting [id number] here -> will resume home dosing this afternoon - will need to consider appropriateness of OAMulberryt discharge   3. Moderate to severe mitral stenosis with mitral valve abnormality - see TEE results, not felt to reflect endocarditis - MRI confirms posterolateral mitral annulus mass consistent with degenerative mitral annular calcification - will discuss if any further evaluation needed at this time with MD - regardless will need OP follow-up of mitral stenosis   4. H/o nonobstructive CAD/coronary vasospasm - recent OP troponin negative, no angina - see above regarding diltiazem - previously on Imdur, listed as not taking PTA - on statin    5. History of diastolic dysfunction - not on diuretic PTA, no history of clinical heart failure by record - BNP generally low this admission - euvolemic   6. Essential HTN - follow with increase in diltiazem, otherwise defer management to primary team  7. + FOBT/dark stools - microcytic anemia with h/o thalassemia - have relayed update to primary IM attending, consider GI consultation    Other medical  issues include per IM - Leukocytosis - Suspected CKD stage IIIa by historical creatinine, Cr relatively stable - Abnormal brain MRI raising question of dural AV fistula - DM with elevated blood sugars  For questions or updates, please contact [facility] Please consult [email or web address] for contact info under Cardiology/STEMI.  Signed, DaCharlie PitterPA-C 03/31/2021, 9:12 AM    Personally seen and examined. Agree with above.  Blood positive stool, melena.  Creatinine stable 1.17, hemoglobin down to 7.8 from 8.12 days ago.  Cardiac MRI reviewed-validates her mitral valve mass is mitral annular calcification.  Benign.  She does have a degree of mitral stenosis seen on transesophageal echocardiogram.  This can be followed clinically at this point.  Does not need surgical evaluation at this time.  Atrial fibrillation episode was brief during this admission.  Given her dark stools worrisome for melena with blood positivity, no anticoagulation.  Continue to evaluate for appropriateness for anticoagulation later.  Agree with transitioning to diltiazem 240 mg a day.  Fevers have resolved, no evidence of endocarditis.  We will go ahead and sign off.  No new cardiac recommendations at this time.  We will continue to follow as outpatient.,  Dr. HaEllyn Hack MaCandee FurbishMD

## 2021-03-31 NOTE — Plan of Care (Signed)
  Problem: Education: Goal: Knowledge of General Education information will improve Description: Including pain rating scale, medication(s)/side effects and non-pharmacologic comfort measures Outcome: Progressing   Problem: Clinical Measurements: Goal: Will remain free from infection Outcome: Progressing Goal: Cardiovascular complication will be avoided Outcome: Progressing   Problem: Pain Managment: Goal: General experience of comfort will improve Outcome: Completed/Met

## 2021-03-31 NOTE — Progress Notes (Signed)
F/u scheduled with cardiology team 8/4, appt on AVS.

## 2021-03-31 NOTE — Progress Notes (Signed)
Physical Therapy Treatment Patient Details Name: Lynn [name] MRN: [id number] DOB: 10/15/1956 Today's Date: 03/31/2021    History of Present Illness 65 yo female admitted with acute respiratory failure, Afib, fever. Ruling out endocarditis. Hx of DM, CAD, RA    PT Comments    Pt in bed on 3 lts at rest at 99%.  Pt moves well and is able to self transfer OOB and amb at Supervision level with no need for any AD.  Assisted with amb in hallway.  General Gait Details: x 2 standing and one seated rest break due to mild dyspnea/deconditioned.  Occassional need for rail to steady self.  Trial RA decreased to 83%.  Required 3 lts to achieve sats > 92%.  Avg HR 84  SATURATION QUALIFICATIONS: (This note is used to comply with regulatory documentation for home oxygen)  Patient Saturations on Room Air at Rest =93%  Patient Saturations on Room Air while Ambulating 200 feet = 83%  Patient Saturations on 3 Liters of oxygen while Ambulating =>92%  Please briefly explain why patient needs home oxygen: pt DOES require supplemental oxygen to achieve sats > 92%    Follow Up Recommendations  No PT follow up     Equipment Recommendations  3in1 (PT);Other (comment) (oxygen)    Recommendations for Other Services       Precautions / Restrictions Precautions Precautions: Fall Precaution Comments: monitor o2    Mobility  Bed Mobility Overal bed mobility: Independent             General bed mobility comments: self able    Transfers Overall transfer level: Modified independent               General transfer comment: self able with good safety cognition  Ambulation/Gait Ambulation/Gait assistance: Supervision Gait Distance (Feet): 500 Feet Assistive device: None Gait Pattern/deviations: Step-through pattern Gait velocity: decreased   General Gait Details: x 2 standing and one seated rest break due to mild dyspnea/deconditioned.  Trial RA decreased to 83%.  Required 3  lts to achieve sats > 92%.  Avg HR 84   Stairs             Wheelchair Mobility    Modified Rankin (Stroke Patients Only)       Balance                                            Cognition Arousal/Alertness: Awake/alert Behavior During Therapy: WFL for tasks assessed/performed Overall Cognitive Status: Within Functional Limits for tasks assessed                                 General Comments: AxO x 3 very pleasant lady who retired from [employer] Comments        Pertinent Vitals/Pain Pain Assessment: No/denies pain    Home Living                      Prior Function            PT Goals (current goals can now be found in the care plan section) Progress towards PT goals: Progressing toward goals    Frequency    Min 3X/week      PT Plan Current  plan remains appropriate    Co-evaluation              AM-PAC PT "6 Clicks" Mobility   Outcome Measure  Help needed turning from your back to your side while in a flat bed without using bedrails?: None Help needed moving from lying on your back to sitting on the side of a flat bed without using bedrails?: None Help needed moving to and from a bed to a chair (including a wheelchair)?: A Little Help needed standing up from a chair using your arms (e.g., wheelchair or bedside chair)?: A Little Help needed to walk in hospital room?: A Little Help needed climbing 3-5 steps with a railing? : A Little 6 Click Score: 20    End of Session Equipment Utilized During Treatment: Oxygen;Gait belt Activity Tolerance: Patient tolerated treatment well Patient left: in bed;with call bell/phone within reach;with family/visitor present Nurse Communication: Mobility status PT Visit Diagnosis: Difficulty in walking, not elsewhere classified (R26.2)     Time: 1115-1140 PT Time Calculation (min) (ACUTE ONLY): 25 min  Charges:  $Gait  Training: 8-22 mins $Therapeutic Activity: 8-22 mins                     Rica Koyanagi  PTA Acute  Rehabilitation Services Pager      [phone] Office      [phone]

## 2021-04-01 DIAGNOSIS — I10 Essential (primary) hypertension: Secondary | ICD-10-CM | POA: Diagnosis not present

## 2021-04-01 DIAGNOSIS — J209 Acute bronchitis, unspecified: Secondary | ICD-10-CM | POA: Diagnosis not present

## 2021-04-01 DIAGNOSIS — R195 Other fecal abnormalities: Secondary | ICD-10-CM | POA: Diagnosis not present

## 2021-04-01 DIAGNOSIS — K921 Melena: Secondary | ICD-10-CM | POA: Diagnosis not present

## 2021-04-01 DIAGNOSIS — J9601 Acute respiratory failure with hypoxia: Secondary | ICD-10-CM | POA: Diagnosis not present

## 2021-04-01 DIAGNOSIS — I5032 Chronic diastolic (congestive) heart failure: Secondary | ICD-10-CM | POA: Diagnosis not present

## 2021-04-01 LAB — CBC WITH DIFFERENTIAL/PLATELET
Abs Immature Granulocytes: 0.41 10*3/uL — ABNORMAL HIGH (ref 0.00–0.07)
Basophils Absolute: 0 10*3/uL (ref 0.0–0.1)
Basophils Relative: 0 %
Eosinophils Absolute: 0 10*3/uL (ref 0.0–0.5)
Eosinophils Relative: 0 %
HCT: 29 % — ABNORMAL LOW (ref 36.0–46.0)
Hemoglobin: 8.6 g/dL — ABNORMAL LOW (ref 12.0–15.0)
Immature Granulocytes: 2 %
Lymphocytes Relative: 18 %
Lymphs Abs: 4.2 10*3/uL — ABNORMAL HIGH (ref 0.7–4.0)
MCH: 18.5 pg — ABNORMAL LOW (ref 26.0–34.0)
MCHC: 29.7 g/dL — ABNORMAL LOW (ref 30.0–36.0)
MCV: 62.5 fL — ABNORMAL LOW (ref 80.0–100.0)
Monocytes Absolute: 1.9 10*3/uL — ABNORMAL HIGH (ref 0.1–1.0)
Monocytes Relative: 8 %
Neutro Abs: 16.5 10*3/uL — ABNORMAL HIGH (ref 1.7–7.7)
Neutrophils Relative %: 72 %
Platelets: 356 10*3/uL (ref 150–400)
RBC: 4.64 MIL/uL (ref 3.87–5.11)
RDW: 18.9 % — ABNORMAL HIGH (ref 11.5–15.5)
WBC: 23 10*3/uL — ABNORMAL HIGH (ref 4.0–10.5)
nRBC: 0.7 % — ABNORMAL HIGH (ref 0.0–0.2)

## 2021-04-01 LAB — GLUCOSE, CAPILLARY
Glucose-Capillary: 142 mg/dL — ABNORMAL HIGH (ref 70–99)
Glucose-Capillary: 189 mg/dL — ABNORMAL HIGH (ref 70–99)
Glucose-Capillary: 205 mg/dL — ABNORMAL HIGH (ref 70–99)
Glucose-Capillary: 337 mg/dL — ABNORMAL HIGH (ref 70–99)
Glucose-Capillary: 369 mg/dL — ABNORMAL HIGH (ref 70–99)
Glucose-Capillary: 458 mg/dL — ABNORMAL HIGH (ref 70–99)

## 2021-04-01 LAB — RENAL FUNCTION PANEL
Albumin: 2.9 g/dL — ABNORMAL LOW (ref 3.5–5.0)
Anion gap: 6 (ref 5–15)
BUN: 22 mg/dL (ref 8–23)
CO2: 31 mmol/L (ref 22–32)
Calcium: 9.1 mg/dL (ref 8.9–10.3)
Chloride: 97 mmol/L — ABNORMAL LOW (ref 98–111)
Creatinine, Ser: 1.18 mg/dL — ABNORMAL HIGH (ref 0.44–1.00)
GFR, Estimated: 51 mL/min — ABNORMAL LOW (ref >60)
Glucose, Bld: 213 mg/dL — ABNORMAL HIGH (ref 70–99)
Phosphorus: 3.9 mg/dL (ref 2.5–4.6)
Potassium: 4.4 mmol/L (ref 3.5–5.1)
Sodium: 134 mmol/L — ABNORMAL LOW (ref 135–145)

## 2021-04-01 LAB — MAGNESIUM: Magnesium: 2 mg/dL (ref 1.7–2.4)

## 2021-04-01 MED ORDER — INSULIN ASPART 100 UNIT/ML IJ SOLN
10.0000 [IU] | Freq: Once | INTRAMUSCULAR | Status: AC
  Administered 2021-04-01: 10 [IU] via SUBCUTANEOUS

## 2021-04-01 MED ORDER — INSULIN GLARGINE 100 UNIT/ML ~~LOC~~ SOLN
35.0000 [IU] | Freq: Two times a day (BID) | SUBCUTANEOUS | Status: DC
  Administered 2021-04-01 – 2021-04-03 (×4): 35 [IU] via SUBCUTANEOUS
  Filled 2021-04-01 (×5): qty 0.35

## 2021-04-01 NOTE — Progress Notes (Signed)
PROGRESS NOTE  Lynn [name] BPZ:[id number] DOB: July 14, 1956   PCP: [name], MD  Patient is from: Home  DOA: 03/23/2021 LOS: 9  Chief complaints:  Chief Complaint  Patient presents with   Fever   Cough   Nausea   Fatigue     Brief Narrative / Interim history: 65 year old F with PMH of RA on MTX, diastolic CHF, HTN, HLD, DM-2 and thalassemia presenting with fever, shortness of breath and dry cough that did not improve with p.o. doxycycline outpatient.  Patient was febrile but hemodynamically stable.  His Pro-Cal is low.  She completed antibiotic course for CAP coverage but continues to spike fever.  D-dimer was elevated but CTA negative for PE or any acute finding.  CRP and ESR elevated and trended up.  Full RVP panel, COVID-19, blood culture and BAL cultures negative.   TTE with ill-defined 3.23 x 2.1 cm mitral valve density raising concern for possible endocarditis. Infectious disease consulted.  Started on IV vancomycin.  Cardiology consulted and TEE concerning for large circumscribed mass adherent to the posterior annulus of MV measuring 1.8 x 1.4 cm, as well as moderate to severe mitral stenosis.  However, cardiac MRI and high-resolution which showed posterolateral mitral annulus mass consistent with degenerative mitral annular calcification.  She also had CT abdomen and pelvis that was unrevealing.  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  IV vancomycin discontinued.  Pulmonology consulted and she had a BAL which is consistent lymphocytosis suggesting MTX or feather-pillow related hypersensitivity pneumonitis.  Patient was started on systemic steroid.  Methotrexate discontinued.  ID recommended Bactrim for PPx if she remains on significant dose of prednisone.  Pulmonology holding Bactrim pending G6PD result.  Patient had melanotic stool while in the hospital.  Hemoccult positive but H&H stable.  GI consulted and planning EGD +/- colonoscopy under general  anesthesia if cardiopulmonary status remained stable.  Subjective: Seen and examined earlier this morning.  No major events overnight of this morning.  No complaints.  She denies chest pain, shortness of breath, GI or UTI symptoms.  She reports having normal looking bowel movement this morning.  No melena or hematochezia.  Objective: Vitals:   03/31/21 2031 04/01/21 0500 04/01/21 0808 04/01/21 0830  BP: (!) 153/98 125/67    Pulse: 84 64    Resp:  18    Temp: 98.3 F (36.8 C) 97.8 F (36.6 C)    TempSrc:  Oral    SpO2: 94% 98% 100% 92%  Weight:  108.5 kg    Height:        Intake/Output Summary (Last 24 hours) at 04/01/2021 1323 Last data filed at 04/01/2021 0819 Gross per 24 hour  Intake 700 ml  Output --  Net 700 ml   Filed Weights   03/30/21 0356 03/31/21 0455 04/01/21 0500  Weight: 108.2 kg 107.2 kg 108.5 kg    Examination:  GENERAL: No apparent distress.  Nontoxic. HEENT: MMM.  Vision and hearing grossly intact.  NECK: Supple.  No apparent JVD.  RESP: 98% on 3 L.  No IWOB.  Fair aeration bilaterally. CVS:  RRR. Heart sounds normal.  ABD/GI/GU: BS+. Abd soft, NTND.  MSK/EXT:  Moves extremities. No apparent deformity. No edema.  SKIN: no apparent skin lesion or wound NEURO: Awake and alert. Oriented appropriately.  No apparent focal neuro deficit. PSYCH: Calm. Normal affect.   Procedures:  6/14-bronchial lavage 6/14-TEE  Microbiology summarized: COVID-19 and influenza PCR nonreactive. Full RVP panel negative. MRSA PCR screen negative. Blood  cultures negative. Urine culture with insignificant growth. AFB smear negative.  Assessment & Plan: Acute respiratory failure with hypoxia-unclear etiology despite extensive work-up as above but concern for pneumonitis related to MTX and feather pillow.  -Follow BAL fungal cultures and acid-fast cultures. -Continue IV Solu-Medrol -Prophylactic Bactrim on hold pending G6PD -Continue breathing treatments. -PCCM  following.  Fever of unknown origin: Due to pneumonitis?.  Last fever 6/11 -Completed CAP coverage.   -Vancomycin 6/11-6/15 out of concern for endocarditis which has been ruled out.   Cardiac mass: TTE and TEE raised concern for vegetation but found to be degenerative mitral annular calcification on cardiac MRI and high-resolution CT   Moderate to severe mitral stenosis -Follow-up with cardiology.   History of nonobstructive CAD/chronic diastolic CHF: Stable.  TTE and TEE reassuring.  Appears euvolemic. -Monitor fluid status and respiratory status   Essential hypertension: Normotensive. -Continue losartan and diltiazem -Continue low-dose hydralazine     IDDM-2 with hyperglycemia: Hyperglycemia likely due to steroid. Recent Labs  Lab 03/31/21 2028 04/01/21 0006 04/01/21 0457 04/01/21 0752 04/01/21 1138  GLUCAP 275* 337* 189* 142* 205*  -Increase Lantus from 30 to 35 units twice daily -Continue SSI-high with night coverage. -Continue NovoLog 9 units 3 times daily with meals -Further adjustment as appropriate.   Microcytic anemia/thalassemia: H&H stable.  Hemoccult positive. Recent Labs    08/25/20 0000 02/22/21 0000 03/20/21 1220 03/23/21 1301 03/26/21 0402 03/27/21 0351 03/28/21 0333 03/29/21 0342 03/31/21 0431 04/01/21 0837  HGB 9.2* 9.6* 9.5* 9.3* 8.1* 8.0* 8.1* 7.6* 7.8* 8.6*  -GI planning EGD +/- colonoscopy  -Monitor H&H. -SCD for VTE prophylaxis.     Paroxysmal atrial tachycardia/paroxysmal A. fib with RVR: RVR resolved. -Continue Cardizem -No anticoagulation in the setting of positive Hemoccult/GI bleed. . At risk for sleep apnea -Needs outpatient sleep study.  History of rheumatoid arthritis: On methotrexate at home. Concerned about pneumonitis with MTX -Continue steroid as above.  Debility/generalized weakness -PT/OT.  Morbid obesity Body mass index is 41.06 kg/m.  -Encourage lifestyle change to lose weight.       DVT prophylaxis:   Place and maintain sequential compression device Start: 03/30/21 1930 SCDs Start: 03/23/21 2149  Code Status: Full code Family Communication: Patient and/or RN. Available if any question.  Level of care: Telemetry Status is: Inpatient  Remains inpatient appropriate because:Ongoing diagnostic testing needed not appropriate for outpatient work up, IV treatments appropriate due to intensity of illness or inability to take PO, and Inpatient level of care appropriate due to severity of illness  Dispo: The patient is from: Home              Anticipated d/c is to: Home              Patient currently is not medically stable to d/c.   Difficult to place patient No       Consultants:  Pulmonology Infectious disease Cardiology Gastroenterology   Sch Meds:  Scheduled Meds:  arformoterol  15 mcg Nebulization BID   atorvastatin  80 mg Oral q1800   diltiazem  240 mg Oral Daily   fluticasone  1 spray Each Nare Daily   hydrALAZINE  25 mg Oral Q8H   insulin aspart  0-20 Units Subcutaneous TID WC   insulin aspart  0-5 Units Subcutaneous QHS   insulin aspart  9 Units Subcutaneous TID WC   insulin glargine  30 Units Subcutaneous BID   loratadine  10 mg Oral Daily   losartan  100 mg Oral Daily  methylPREDNISolone (SOLU-MEDROL) injection  40 mg Intravenous Q24H   multivitamin with minerals  1 tablet Oral Daily   pantoprazole (PROTONIX) IV  40 mg Intravenous Q12H   revefenacin  175 mcg Nebulization Daily   Continuous Infusions:  lactated ringers     PRN Meds:.acetaminophen **OR** acetaminophen, albuterol, guaiFENesin-dextromethorphan, HYDROcodone bit-homatropine, ondansetron (ZOFRAN) IV  Antimicrobials: Anti-infectives (From admission, onward)    Start     Dose/Rate Route Frequency Ordered Stop   04/01/21 1000  sulfamethoxazole-trimethoprim (BACTRIM DS) 800-160 MG per tablet 1 tablet  Status:  Discontinued        1 tablet Oral Daily 03/31/21 1700 03/31/21 1741   03/30/21 1407   cefTRIAXone (ROCEPHIN) 2 g in sodium chloride 0.9 % 100 mL IVPB  Status:  Discontinued        2 g 200 mL/hr over 30 Minutes Intravenous Every 24 hours 03/30/21 0909 03/31/21 1700   03/30/21 1300  sulfamethoxazole-trimethoprim (BACTRIM DS) 800-160 MG per tablet 1 tablet  Status:  Discontinued        1 tablet Oral Every 12 hours 03/30/21 1207 03/31/21 1700   03/26/21 1600  vancomycin (VANCOREADY) IVPB 1000 mg/200 mL  Status:  Discontinued        1,000 mg 200 mL/hr over 60 Minutes Intravenous Every 24 hours 03/25/21 1514 03/29/21 1556   03/25/21 1700  cefTRIAXone (ROCEPHIN) 2 g in sodium chloride 0.9 % 100 mL IVPB  Status:  Discontinued        2 g 200 mL/hr over 30 Minutes Intravenous Every 12 hours 03/25/21 1429 03/30/21 0909   03/25/21 1600  vancomycin (VANCOCIN) 2,250 mg in sodium chloride 0.9 % 500 mL IVPB        2,250 mg 250 mL/hr over 120 Minutes Intravenous STAT 03/25/21 1503 03/25/21 1815   03/25/21 1515  cefTRIAXone (ROCEPHIN) 1 g in sodium chloride 0.9 % 100 mL IVPB  Status:  Discontinued        1 g 200 mL/hr over 30 Minutes Intravenous Every 24 hours 03/25/21 1421 03/25/21 1429   03/24/21 1530  azithromycin (ZITHROMAX) tablet 500 mg  Status:  Discontinued        500 mg Oral Daily 03/24/21 1525 03/28/21 0942   03/24/21 0145  doxycycline (VIBRA-TABS) tablet 100 mg  Status:  Discontinued       Note to Pharmacy: Patient taking differently: Start date : 03/20/21     100 mg Oral 2 times daily 03/24/21 0131 03/24/21 1038        I have personally reviewed the following labs and images: CBC: Recent Labs  Lab 03/27/21 0351 03/28/21 0333 03/29/21 0342 03/31/21 0431 04/01/21 0837  WBC 18.0* 30.2* 25.6* 20.5* 23.0*  NEUTROABS  --   --   --   --  16.5*  HGB 8.0* 8.1* 7.6* 7.8* 8.6*  HCT 27.2* 27.6* 26.0* 26.2* 29.0*  MCV 63.6* 63.7* 64.5* 62.4* 62.5*  PLT 361 412* 370 338 356   BMP &GFR Recent Labs  Lab 03/27/21 1132 03/28/21 0333 03/29/21 0342 03/30/21 0341 03/31/21 0431  04/01/21 0837  NA 133* 133* 134*  --  136 134*  K 4.3 4.9 5.0 4.3 4.5 4.4  CL 97* 99 102  --  100 97*  CO2 _0 --  29 31  GLUCOSE 467* 379* 303*  --  295* 213*  BUN 22 25* 27*  --  24* 22  CREATININE 1.11* 1.16* 1.17*  --  1.17* 1.18*  CALCIUM 9.4 9.3 8.7*  --  8.9 9.1  MG  --   --   --   --   --  2.0  PHOS  --   --   --   --   --  3.9   Estimated Creatinine Clearance: 55.7 mL/min (A) (by C-G formula based on SCr of 1.18 mg/dL (H)). Liver & Pancreas: Recent Labs  Lab 03/26/21 0402 03/27/21 0351 03/28/21 0333 03/29/21 0342 04/01/21 0837  AST _0 --   ALT _1 --   ALKPHOS 62 68 84 68  --   BILITOT 0.7 0.6 0.2* 0.2*  --   PROT 7.1 7.5 7.6 6.9  --   ALBUMIN 3.2* 3.1* 3.3* 3.1* 2.9*   No results for input(s): LIPASE, AMYLASE in the last 168 hours. No results for input(s): AMMONIA in the last 168 hours. Diabetic: No results for input(s): HGBA1C in the last 72 hours. Recent Labs  Lab 03/31/21 2028 04/01/21 0006 04/01/21 0457 04/01/21 0752 04/01/21 1138  GLUCAP 275* 337* 189* 142* 205*   Cardiac Enzymes: No results for input(s): CKTOTAL, CKMB, CKMBINDEX, TROPONINI in the last 168 hours.  Recent Labs    03/20/21 1220  PROBNP 57.0   Coagulation Profile: No results for input(s): INR, PROTIME in the last 168 hours. Thyroid Function Tests: No results for input(s): TSH, T4TOTAL, FREET4, T3FREE, THYROIDAB in the last 72 hours. Lipid Profile: No results for input(s): CHOL, HDL, LDLCALC, TRIG, CHOLHDL, LDLDIRECT in the last 72 hours. Anemia Panel: No results for input(s): VITAMINB12, FOLATE, FERRITIN, TIBC, IRON, RETICCTPCT in the last 72 hours. Urine analysis:    Component Value Date/Time   COLORURINE YELLOW 03/23/2021 1732   APPEARANCEUR HAZY (A) 03/23/2021 1732   LABSPEC 1.017 03/23/2021 1732   PHURINE 5.0 03/23/2021 1732   GLUCOSEU NEGATIVE 03/23/2021 1732   HGBUR SMALL (A) 03/23/2021 1732   BILIRUBINUR NEGATIVE 03/23/2021 1732    KETONESUR NEGATIVE 03/23/2021 1732   PROTEINUR NEGATIVE 03/23/2021 1732   UROBILINOGEN 0.2 08/18/2008 1535   NITRITE NEGATIVE 03/23/2021 1732   LEUKOCYTESUR TRACE (A) 03/23/2021 1732   Sepsis Labs: Invalid input(s): PROCALCITONIN, [place]  Microbiology: Recent Results (from the past 240 hour(s))  Resp Panel by RT-PCR (Flu A&B, Covid) Nasopharyngeal Swab     Status: None   Collection Time: 03/23/21  5:32 PM   Specimen: Nasopharyngeal Swab; Nasopharyngeal(NP) swabs in vial transport medium  Result Value Ref Range Status   SARS Coronavirus 2 by RT PCR NEGATIVE NEGATIVE Final    Comment: (NOTE) SARS-CoV-2 target nucleic acids are NOT DETECTED.  The SARS-CoV-2 RNA is generally detectable in upper respiratory specimens during the acute phase of infection. The lowest concentration of SARS-CoV-2 viral copies this assay can detect is 138 copies/mL. A negative result does not preclude SARS-Cov-2 infection and should not be used as the sole basis for treatment or other patient management decisions. A negative result may occur with  improper specimen collection/handling, submission of specimen other than nasopharyngeal swab, presence of viral mutation(s) within the areas targeted by this assay, and inadequate number of viral copies(<138 copies/mL). A negative result must be combined with clinical observations, patient history, and epidemiological information. The expected result is Negative.  Fact Sheet for Patients:  EntrepreneurPulse.com.au  Fact Sheet for Healthcare Providers:  IncredibleEmployment.be  This test is no t yet approved or cleared by the [place] FDA and  has been authorized for detection and/or diagnosis of SARS-CoV-2 by FDA under an Emergency Use Authorization (EUA). This EUA will  remain  in effect (meaning this test can be used) for the duration of the COVID-19 declaration under Section 564(b)(1) of the Act,  21 U.S.C.section 360bbb-3(b)(1), unless the authorization is terminated  or revoked sooner.       Influenza A by PCR NEGATIVE NEGATIVE Final   Influenza B by PCR NEGATIVE NEGATIVE Final    Comment: (NOTE) The Xpert Xpress SARS-CoV-2/FLU/RSV plus assay is intended as an aid in the diagnosis of influenza from Nasopharyngeal swab specimens and should not be used as a sole basis for treatment. Nasal washings and aspirates are unacceptable for Xpert Xpress SARS-CoV-2/FLU/RSV testing.  Fact Sheet for Patients: EntrepreneurPulse.com.au  Fact Sheet for Healthcare Providers: IncredibleEmployment.be  This test is not yet approved or cleared by the [place] FDA and has been authorized for detection and/or diagnosis of SARS-CoV-2 by FDA under an Emergency Use Authorization (EUA). This EUA will remain in effect (meaning this test can be used) for the duration of the COVID-19 declaration under Section 564(b)(1) of the Act, 21 U.S.C. section 360bbb-3(b)(1), unless the authorization is terminated or revoked.  Performed at [facility], [place] [address], [place], [place] [address]   Urine culture     Status: Abnormal   Collection Time: 03/23/21  5:32 PM   Specimen: Urine, Clean Catch  Result Value Ref Range Status   Specimen Description   Final    URINE, CLEAN CATCH Performed at [facility], [place] [address] [place], [place] [address]    Special Requests   Final    NONE Performed at [facility], [place] [address], [place], [place] [address]    Culture (A)  Final    <10,000 COLONIES/mL INSIGNIFICANT GROWTH Performed at [place] [address] [place], [place] [address]    Report Status 03/25/2021 FINAL  Final  Culture, blood (Routine X 2) w Reflex to ID Panel     Status: None   Collection Time: 03/24/21  2:25 AM   Specimen: BLOOD  Result Value Ref Range Status   Specimen  Description   Final    BLOOD BLOOD LEFT FOREARM Performed at [place] [address] [place], [place] [address]    Special Requests   Final    BOTTLES DRAWN AEROBIC AND ANAEROBIC Blood Culture adequate volume Performed at [place] [address] [place], [place] [address]    Culture   Final    NO GROWTH 5 DAYS Performed at [facility], [place] [address] [place], [place] [address]    Report Status 03/29/2021 FINAL  Final  Culture, blood (Routine X 2) w Reflex to ID Panel     Status: None   Collection Time: 03/24/21  2:30 AM   Specimen: BLOOD  Result Value Ref Range Status   Specimen Description   Final    BLOOD BLOOD RIGHT HAND Performed at [place] [address], [place], [place] [address]    Special Requests   Final    BOTTLES DRAWN AEROBIC AND ANAEROBIC Blood Culture adequate volume Performed at [place] [address] [place], [place] [address]    Culture   Final    NO GROWTH 5 DAYS Performed at [facility], [place] [address] [place],  [address]    Report Status 03/29/2021 FINAL  Final  Culture, blood (routine x 2)     Status: None   Collection Time: 03/24/21 11:30 AM  Specimen: BLOOD  Result Value Ref Range Status   Specimen Description   Final    BLOOD LEFT ANTECUBITAL Performed at [facility], [place] [address], [place], [place] [address]    Special Requests   Final    BOTTLES DRAWN AEROBIC AND ANAEROBIC Blood Culture adequate volume Performed at [place] [address] [place], [place] [address]    Culture   Final    NO GROWTH 5 DAYS Performed at [facility], [place] [address], [place], [place] [address]    Report Status 03/29/2021 FINAL  Final  MRSA PCR Screening     Status: None   Collection Time: 03/25/21  1:27 PM   Specimen: Nasal Mucosa; Nasopharyngeal  Result Value Ref Range Status   MRSA by PCR NEGATIVE  NEGATIVE Final    Comment:        The GeneXpert MRSA Assay (FDA approved for NASAL specimens only), is one component of a comprehensive MRSA colonization surveillance program. It is not intended to diagnose MRSA infection nor to guide or monitor treatment for MRSA infections. Performed at [facility], [place] [address] [place], [place] [address]   Respiratory (~20 pathogens) panel by PCR     Status: None   Collection Time: 03/26/21  7:50 AM  Result Value Ref Range Status   Adenovirus NOT DETECTED NOT DETECTED Final   Coronavirus 229E NOT DETECTED NOT DETECTED Final    Comment: (NOTE) The Coronavirus on the Respiratory Panel, DOES NOT test for the novel  Coronavirus (2019 nCoV)    Coronavirus HKU1 NOT DETECTED NOT DETECTED Final   Coronavirus NL63 NOT DETECTED NOT DETECTED Final   Coronavirus OC43 NOT DETECTED NOT DETECTED Final   Metapneumovirus NOT DETECTED NOT DETECTED Final   Rhinovirus / Enterovirus NOT DETECTED NOT DETECTED Final   Influenza A NOT DETECTED NOT DETECTED Final   Influenza B NOT DETECTED NOT DETECTED Final   Parainfluenza Virus 1 NOT DETECTED NOT DETECTED Final   Parainfluenza Virus 2 NOT DETECTED NOT DETECTED Final   Parainfluenza Virus 3 NOT DETECTED NOT DETECTED Final   Parainfluenza Virus 4 NOT DETECTED NOT DETECTED Final   Respiratory Syncytial Virus NOT DETECTED NOT DETECTED Final   Bordetella pertussis NOT DETECTED NOT DETECTED Final   Bordetella Parapertussis NOT DETECTED NOT DETECTED Final   Chlamydophila pneumoniae NOT DETECTED NOT DETECTED Final   Mycoplasma pneumoniae NOT DETECTED NOT DETECTED Final    Comment: Performed at [facility], [place]. [address] [place], [place] [address]  Culture, Respiratory w Gram Stain     Status: None   Collection Time: 03/28/21  2:10 PM   Specimen: Bronchoalveolar Lavage; Respiratory  Result Value Ref Range Status   Specimen Description BRONCHIAL ALVEOLAR LAVAGE  Final   Special Requests  NONE  Final   Gram Stain   Final    FEW WBC PRESENT, PREDOMINANTLY MONONUCLEAR NO ORGANISMS SEEN    Culture   Final    NO GROWTH 2 DAYS Performed at [facility], [address] [place], [place] [address]    Report Status 03/31/2021 FINAL  Final  Acid Fast Smear (AFB)     Status: None   Collection Time: 03/28/21  2:10 PM   Specimen: Bronchial Alveolar Lavage  Result Value Ref Range Status   AFB Specimen Processing Concentration  Final   Acid Fast Smear Negative  Final    Comment: (NOTE) Performed At: [place],  [place] [id number] Rush Farmer MD [id number]    Source (AFB) BRONCHIAL ALVEOLAR LAVAGE  Final    Comment: RML Performed at [facility], [place] [address], [place], [place] [address]   Fungus Culture With Stain     Status: None (Preliminary result)   Collection Time: 03/28/21  2:10 PM   Specimen: Bronchial Alveolar Lavage  Result Value Ref Range Status   Fungus Stain Final report  Final    Comment: (NOTE) Performed At: [facility] [place], [place] [id number] Rush Farmer MD [id number]    Fungus (Mycology) Culture PENDING  Incomplete   Fungal Source BRONCHIAL ALVEOLAR LAVAGE  Final    Comment: RML Performed at [facility], [place] [address] [place], [place] [address]   Fungus Culture Result     Status: None   Collection Time: 03/28/21  2:10 PM  Result Value Ref Range Status   Result 1 Comment  Final    Comment: (NOTE) KOH/Calcofluor preparation:  no fungus observed. Performed At: [facility] [place], [place] [id number] Rush Farmer MD [id number]     Radiology Studies: No results found.    Kaisley Stiverson T. [place]  If 7PM-7AM, please contact night-coverage [email or web address] 04/01/2021, 1:23 PM

## 2021-04-01 NOTE — Progress Notes (Signed)
[facility] Progress Note  CC:  I'm feeling better today  Assessment / Plan: Melena and FOBT+ stools with concerns for upper GI bleed in the setting of steroid use for acute hypoxemic respiratory failure and newly recognized atrial fibrillation and moderate to severe mitral stenosis. She has chronic microcytic anemia due to thalassemia. She reports a history of gastric ulcers 10-15 years ago. Had a colonoscopy with Dr. Fuller Plan in 2017 that revealed a hyperplastic polyp and diverticulosis.   Nasal cannula oxygen has been weaned to 3L from 5L yesterday. Will need high dose steroids for at least 6 weeks.    BUN is normalizing. Hemoglobin is stable at 8.5. Baseline hemoglobin appears to be 9.5-10.5.  Tentatively planning EGD with monitored anesthesia 04/03/21 if cardiopulmonary status remains stable.   Continue PPI BID, serial hgb/hct in the meantime. Will consider colonoscopy in the future if EGD is non-diagnostic.     Subjective: Feeling better today albeit tired. No GI bleeding. GI ROS is negative. No family present at the time of my evalaution.   Objective:  Vital signs in last 24 hours: Temp:  [97.8 F (36.6 C)-98.3 F (36.8 C)] 97.8 F (36.6 C) (06/18 0500) Pulse Rate:  [64-84] 64 (06/18 0500) Resp:  [18-20] 18 (06/18 0500) BP: (125-177)/(67-98) 125/67 (06/18 0500) SpO2:  [92 %-100 %] 92 % (06/18 0830) FiO2 (%):  [94 %] 94 % (06/17 1932) Weight:  [108.5 kg] 108.5 kg (06/18 0500) Last BM Date: 04/01/21 General:   Alert, in NAD Heart:  Regular rate and rhythm; no murmurs Pulm: Clear anteriorly; no wheezing Abdomen:  Soft. Nontender. Nondistended. Normal bowel sounds. No rebound or guarding. LAD: No inguinal or umbilical LAD Extremities:  Without edema. Neurologic:  Alert and  oriented x4;  grossly normal neurologically. Psych:  Alert and cooperative. Normal mood and affect.  Intake/Output from previous day: 06/17 0701 - 06/18 0700 In: 1420 [P.O.:1320; IV  Piggyback:100] Out: -  Intake/Output this shift: Total I/O In: 120 [P.O.:120] Out: -   Lab Results: Recent Labs    03/31/21 0431 04/01/21 0837  WBC 20.5* 23.0*  HGB 7.8* 8.6*  HCT 26.2* 29.0*  PLT 338 356   BMET Recent Labs    03/30/21 0341 03/31/21 0431 04/01/21 0837  NA  --  136 134*  K 4.3 4.5 4.4  CL  --  100 97*  CO2  --  29 31  GLUCOSE  --  295* 213*  BUN  --  24* 22  CREATININE  --  1.17* 1.18*  CALCIUM  --  8.9 9.1   LFT Recent Labs    04/01/21 0837  ALBUMIN 2.9*   PT/INR No results for input(s): LABPROT, INR in the last 72 hours. Hepatitis Panel No results for input(s): HEPBSAG, HCVAB, HEPAIGM, HEPBIGM in the last 72 hours.  CT Chest High Resolution  Result Date: 03/30/2021 CLINICAL DATA:  Interstitial lung disease, lymphocytosis on bronchoalveolar lavage. Acute hypoxic respiratory failure. EXAM: CT CHEST WITHOUT CONTRAST TECHNIQUE: Multidetector CT imaging of the chest was performed following the standard protocol without intravenous contrast. High resolution imaging of the lungs, as well as inspiratory and expiratory imaging, was performed. COMPARISON:  03/21/2021. FINDINGS: Cardiovascular: Atherosclerotic calcification of the aorta, aortic valve and coronary arteries. Pulmonic trunk and heart are enlarged. No pericardial effusion. Mediastinum/Nodes: Numerous mediastinal lymph nodes, none of which are enlarged by CT size criteria. Hilar regions are difficult to evaluate without IV contrast. No axillary adenopathy. Esophagus is grossly unremarkable. Lungs/Pleura: Interval development  of worsening peribronchovascular ground-glass, interstitial thickening and scattered collapse/consolidation, upper and midlung zone predominant. No definite air trapping. There may be trace right pleural fluid. Airway is unremarkable. Upper Abdomen: Visualized portions of the liver, adrenal glands, kidneys, spleen, pancreas, stomach and bowel are grossly unremarkable. Upper  abdominal lymph nodes are not enlarged by CT size criteria. Musculoskeletal: Degenerative changes in the spine. IMPRESSION: 1. Marked increase in upper/midlung zone predominant peribronchovascular ground-glass, interstitial thickening and scattered collapse/consolidation when compared with 03/21/2021. Findings are likely due to an atypical/viral pneumonia, including due to COVID-19. Acute drug toxicity is not excluded. Findings are suggestive of an alternative diagnosis (not UIP) per consensus guidelines: Diagnosis of Idiopathic Pulmonary Fibrosis: An Official ATS/ERS/JRS/ALAT Clinical Practice Guideline. [place], Iss 5, [phone], Jun 15 2017. 2. Trace right pleural fluid. 3. Aortic atherosclerosis (ICD10-I70.0). Coronary artery calcification. 4. Enlarged pulmonic trunk, indicative of pulmonary arterial hypertension. Electronically Signed   By: Lorin Picket M.D.   On: 03/30/2021 14:14   MR CARDIAC MORPHOLOGY W WO CONTRAST  Result Date: 03/30/2021 CLINICAL DATA:  Cardiac mass EXAM: CARDIAC MRI TECHNIQUE: The patient was scanned on a 1.5 Tesla GE magnet. A dedicated cardiac coil was used. Functional imaging was done using Fiesta sequences. 2,3, and 4 chamber views were done to assess for RWMA's. Modified Simpson's rule using a short axis stack was used to calculate an ejection fraction on a dedicated work Conservation officer, nature. The patient received 10 cc of Gadavist. After 10 minutes inversion recovery sequences were used to assess for infiltration and scar tissue. CONTRAST:  10 cc  of Gadavist FINDINGS: 1. Normal left ventricular size, thickness and systolic function (LVEF = 53%). There are no regional wall motion abnormalities. There is no late gadolinium enhancement in the left ventricular myocardium. There is a 21 x 19 mm mass at the level of the mitral annulus, posterior lateral segment. The mass was hypointense on T1, T2-weighted and short tau inversion recovery  sequences. The mass did not show any contrast uptake or late gadolinium enhancement highly suggestive of degenerative mitral annular calcification. LVEDV: 146 ml LVESV: 68 ml SV: 77 ml CO: 5L/min Myocardial mass: 113g 2. Normal right ventricular size, thickness and systolic function (RVEF = 48%). There are no regional wall motion abnormalities. 3.  Normal left and right atrial size. 4. Normal size of the aortic root, ascending aorta and pulmonary artery. 5.  Mild mitral regurgitation. 6.  Normal pericardium.  No pericardial effusion. IMPRESSION: 1.  Normal right and left ventricular size and function, LVEF 53%. 2. Posterolateral mitral annulus mass consistent with degenerative mitral annular calcification (hypointense on T1, T2 and STIR sequences, no LGE). 3. I reviewed today's High resolution chest CT which confirmed calcification of mitral annular mass as above. 4.  No scar or late gadolinium enhancement noted on LV myocardium. Kate Sable Electronically Signed   By: Kate Sable M.D.   On: 03/30/2021 16:09       LOS: 9 days   Thornton [place]  04/01/2021, 10:17 AM

## 2021-04-02 DIAGNOSIS — D649 Anemia, unspecified: Secondary | ICD-10-CM

## 2021-04-02 DIAGNOSIS — J9601 Acute respiratory failure with hypoxia: Secondary | ICD-10-CM | POA: Diagnosis not present

## 2021-04-02 DIAGNOSIS — R195 Other fecal abnormalities: Secondary | ICD-10-CM | POA: Diagnosis not present

## 2021-04-02 DIAGNOSIS — J209 Acute bronchitis, unspecified: Secondary | ICD-10-CM | POA: Diagnosis not present

## 2021-04-02 DIAGNOSIS — I5032 Chronic diastolic (congestive) heart failure: Secondary | ICD-10-CM | POA: Diagnosis not present

## 2021-04-02 DIAGNOSIS — I10 Essential (primary) hypertension: Secondary | ICD-10-CM | POA: Diagnosis not present

## 2021-04-02 DIAGNOSIS — J849 Interstitial pulmonary disease, unspecified: Secondary | ICD-10-CM | POA: Diagnosis not present

## 2021-04-02 LAB — GLUCOSE, CAPILLARY
Glucose-Capillary: 128 mg/dL — ABNORMAL HIGH (ref 70–99)
Glucose-Capillary: 199 mg/dL — ABNORMAL HIGH (ref 70–99)
Glucose-Capillary: 399 mg/dL — ABNORMAL HIGH (ref 70–99)
Glucose-Capillary: 302 mg/dL — ABNORMAL HIGH (ref 70–99)

## 2021-04-02 LAB — HEMOGLOBIN AND HEMATOCRIT, BLOOD
HCT: 30.2 % — ABNORMAL LOW (ref 36.0–46.0)
Hemoglobin: 8.9 g/dL — ABNORMAL LOW (ref 12.0–15.0)

## 2021-04-02 MED ORDER — PREDNISONE 10 MG PO TABS: 30.0000 mg | ORAL_TABLET | Freq: Every day | ORAL | Status: DC

## 2021-04-02 MED ORDER — PREDNISONE 10 MG PO TABS
40.0000 mg | ORAL_TABLET | Freq: Every day | ORAL | Status: DC
  Administered 2021-04-03: 40 mg via ORAL
  Filled 2021-04-02: qty 4

## 2021-04-02 MED ORDER — PREDNISONE 10 MG PO TABS: 5.0000 mg | ORAL_TABLET | Freq: Every day | ORAL | Status: DC

## 2021-04-02 MED ORDER — PREDNISONE 10 MG PO TABS: 5.0000 mg | ORAL_TABLET | ORAL | Status: DC

## 2021-04-02 MED ORDER — HYDRALAZINE HCL 50 MG PO TABS
50.0000 mg | ORAL_TABLET | Freq: Three times a day (TID) | ORAL | Status: DC
  Administered 2021-04-02 – 2021-04-03 (×4): 50 mg via ORAL
  Filled 2021-04-02 (×4): qty 1

## 2021-04-02 MED ORDER — PREDNISONE 10 MG PO TABS: 20.0000 mg | ORAL_TABLET | Freq: Every day | ORAL | Status: DC

## 2021-04-02 MED ORDER — PREDNISONE 20 MG PO TABS: 40.0000 mg | ORAL_TABLET | Freq: Every day | ORAL | Status: DC

## 2021-04-02 MED ORDER — PREDNISONE 10 MG PO TABS: 10.0000 mg | ORAL_TABLET | Freq: Every day | ORAL | Status: DC

## 2021-04-02 NOTE — Plan of Care (Signed)
  Problem: Health Behavior/Discharge Planning: Goal: Ability to manage health-related needs will improve Outcome: Progressing   Problem: Clinical Measurements: Goal: Ability to maintain clinical measurements within normal limits will improve Outcome: Progressing Goal: Will remain free from infection Outcome: Progressing Goal: Diagnostic test results will improve Outcome: Progressing Goal: Cardiovascular complication will be avoided Outcome: Progressing   Problem: Coping: Goal: Level of anxiety will decrease Outcome: Progressing   Problem: Elimination: Goal: Will not experience complications related to bowel motility Outcome: Progressing Goal: Will not experience complications related to urinary retention Outcome: Progressing   Problem: Safety: Goal: Ability to remain free from injury will improve Outcome: Progressing   Problem: Skin Integrity: Goal: Risk for impaired skin integrity will decrease Outcome: Progressing

## 2021-04-02 NOTE — Progress Notes (Signed)
[facility] Progress Note  CC:  GIB/melena   Subjective: She feels well this morning.  She is no longer using oxygen.  No cough or shortness of breath.  No chest pain.  She is tolerating a regular diet.  She passed a normal formed brown bowel movement this morning.  No rectal bleeding or black stools.  No complaints at this time.   Objective:  Vital signs in last 24 hours: Temp:  [98.1 F (36.7 C)-98.9 F (37.2 C)] 98.1 F (36.7 C) (06/19 0534) Pulse Rate:  [67-70] 67 (06/19 0534) Resp:  [16-22] 16 (06/19 0534) BP: (150-156)/(69-83) 150/83 (06/19 0534) SpO2:  [93 %-98 %] 96 % (06/19 0831) Weight:  [105 kg] 105 kg (06/19 0500) Last BM Date: 04/02/21 General:   Alert 65 year old female in no acute distress. Heart: Regular rate and rhythm, no murmurs. Pulm: Lungs clear throughout without wheezing, rhonchi or crackles. Abdomen: Soft, nondistended.  Nontender.  Positive bowel sounds to all 4 quadrants. Extremities:  Without edema. Neurologic:  Alert and  oriented x4;  grossly normal neurologically. Psych:  Alert and cooperative. Normal mood and affect.  Intake/Output from previous day: 06/18 0701 - 06/19 0700 In: 420 [P.O.:420] Out: -  Intake/Output this shift: No intake/output data recorded.  Lab Results: Recent Labs    03/31/21 0431 04/01/21 0837 04/02/21 0432  WBC 20.5* 23.0*  --   HGB 7.8* 8.6* 8.9*  HCT 26.2* 29.0* 30.2*  PLT 338 356  --    BMET Recent Labs    03/31/21 0431 04/01/21 0837  NA 136 134*  K 4.5 4.4  CL 100 97*  CO2 29 31  GLUCOSE 295* 213*  BUN 24* 22  CREATININE 1.17* 1.18*  CALCIUM 8.9 9.1   LFT Recent Labs    04/01/21 0837  ALBUMIN 2.9*   PT/INR No results for input(s): LABPROT, INR in the last 72 hours. Hepatitis Panel No results for input(s): HEPBSAG, HCVAB, HEPAIGM, HEPBIGM in the last 72 hours.  No results found.  Assessment / Plan:  74. 65 year old female with past medical history of sickle cell anemia  was admitted to the hospital with acute respiratory failure and subsequently developed melenic stools 2 days ago. No abdominal pain. Admission Hg 9.3 -> 7.8.  Positive FOBT. -Proceed with EGD 6/20 as her cardiopulmonary status has stabilized.  EGD benefits and risks discussed including risk with sedation, risk of bleeding, perforation and infection.  -NPO after midnight -Continue pantoprazole 40 mg IV twice daily -BMP, CBC in a.m.   2. Acute hypoxemic respiratory failure, significantly improved.  No longer on oxygen nasal cannula.   3. Atrial fibrillation with RVR, cardioverted to sinus rhythm after diltiazem IV administered. Remains on oral diltiazem. Not on anticoagulation.   4. Posterolateral mitral annulus mass consistent with degenerative mitral annular calcification per cardiac MRI   5. DM II    Principal Problem:   Acute respiratory failure with hypoxia (HCC) Active Problems:   Essential hypertension   Chronic diastolic CHF (congestive heart failure) (HCC)   Type 2 diabetes mellitus with hyperglycemia, with long-term current use of insulin (HCC)   Acute bronchitis   SOB (shortness of breath)   FUO (fever of unknown origin)   Hypokalemia   Generalized weakness   Acute bacterial endocarditis   ILD (interstitial lung disease) (HCC)   Occult GI bleeding   Melena   Preop respiratory exam     LOS: 10 days   Noralyn Pick  04/02/2021, 8:51 AM

## 2021-04-02 NOTE — Progress Notes (Signed)
PROGRESS NOTE  Lynn [name] DPO:[id number] DOB: [birth year]   PCP: [name], MD  Patient is from: Home  DOA: 03/23/2021 LOS: 51  Chief complaints:  Chief Complaint  Patient presents with   Fever   Cough   Nausea   Fatigue     Brief Narrative / Interim history: 65 year old F with PMH of RA on MTX, diastolic CHF, HTN, HLD, DM-2 and thalassemia presenting with fever, shortness of breath and dry cough that did not improve with p.o. doxycycline outpatient.  Patient was febrile but hemodynamically stable.  His Pro-Cal is low.  She completed antibiotic course for CAP coverage but continues to spike fever.  D-dimer was elevated but CTA negative for PE or any acute finding.  CRP and ESR elevated and trended up.  Full RVP panel, COVID-19, blood culture and BAL cultures negative.   TTE with ill-defined 3.23 x 2.1 cm mitral valve density raising concern for possible endocarditis. Infectious disease consulted.  Started on IV vancomycin.  Cardiology consulted and TEE concerning for large circumscribed mass adherent to the posterior annulus of MV measuring 1.8 x 1.4 cm, as well as moderate to severe mitral stenosis.  However, cardiac MRI and high-resolution which showed posterolateral mitral annulus mass consistent with degenerative mitral annular calcification.  She also had CT abdomen and pelvis that was unrevealing.  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  IV vancomycin discontinued.  Pulmonology consulted and she had a BAL which is consistent lymphocytosis suggesting MTX or feather-pillow related hypersensitivity pneumonitis.  Autoimmune labs unrevealing.  Patient was started on systemic steroid.  Methotrexate discontinued.  ID recommended Bactrim for PPx if she remains on significant dose of prednisone.  Pulmonology holding Bactrim pending G6PD result.  Patient had melanotic stool while in the hospital.  Hemoccult positive but H&H stable.  GI consulted and planning EGD +/-  colonoscopy under general anesthesia if cardiopulmonary status remained stable.  Subjective: Seen and examined earlier this morning.  No major events overnight or this morning.  No complaints.  She denies chest pain, shortness of breath, GI or UTI symptoms.  She reports having normal looking bowel movements yesterday and this morning.  She denies melena or hematochezia.  She is currently on room air.   Objective: Vitals:   04/02/21 0500 04/02/21 0534 04/02/21 0801 04/02/21 0831  BP:  (!) 150/83    Pulse:  67    Resp:  16    Temp:  98.1 F (36.7 C)    TempSrc:  Oral    SpO2:  93% 97% 96%  Weight: 105 kg     Height:        Intake/Output Summary (Last 24 hours) at 04/02/2021 1159 Last data filed at 04/01/2021 1805 Gross per 24 hour  Intake 300 ml  Output --  Net 300 ml   Filed Weights   03/31/21 0455 04/01/21 0500 04/02/21 0500  Weight: 107.2 kg 108.5 kg 105 kg    Examination:   GENERAL: No apparent distress.  Nontoxic. HEENT: MMM.  Vision and hearing grossly intact.  NECK: Supple.  No apparent JVD.  RESP: On RA.  No IWOB.  Fair aeration bilaterally. CVS:  RRR. Heart sounds normal.  ABD/GI/GU: BS+. Abd soft, NTND.  MSK/EXT:  Moves extremities. No apparent deformity. No edema.  SKIN: no apparent skin lesion or wound NEURO: Awake and alert. Oriented appropriately.  No apparent focal neuro deficit. PSYCH: Calm. Normal affect.   Procedures:  6/14-bronchial lavage 6/14-TEE  Microbiology summarized: [id number] and influenza  PCR nonreactive. Full RVP panel negative. MRSA PCR screen negative. Blood cultures negative. Urine culture with insignificant growth. AFB smear negative.  Assessment & Plan: Acute respiratory failure with hypoxia-unclear etiology despite extensive infectious and autoimmune work-up as above but concern for hypersensitivity pneumonitis related to MTX and feather pillow.  Briefly desaturated to 88% with ambulation on RA but recovered to 90% with brief  rest. -Follow acid-fast culture and pneumocystis smear -Continue IV Solu-Medrol.  On Protonix for GI prophylaxis. -Prophylactic Bactrim on hold pending G6PD which seems to be canceled by interface lab -Reordered G6PD -Continue breathing treatments. -Continue weaning oxygen as able -Encourage incentive spirometry/OOB -PCCM following.  Fever of unknown origin: Due to pneumonitis?.  Infectious and autoimmune work-up unrevealing so far.  Last fever 6/11 -Completed CAP coverage.   -Vancomycin 6/11-6/15 out of concern for endocarditis which has been ruled out.   Cardiac mass: TTE and TEE raised concern for vegetation but found to be degenerative mitral annular calcification on cardiac MRI and high-resolution CT   Moderate to severe mitral stenosis -Follow-up with cardiology.   History of nonobstructive CAD/chronic diastolic CHF: Stable.  TTE and TEE reassuring.  Appears euvolemic. -Monitor fluid status and respiratory status   Essential hypertension: BP slightly elevated. -Continue losartan and diltiazem -Increase hydralazine from 25 to 50 mg 3 times daily     IDDM-2 with hyperglycemia: Hyperglycemia likely due to steroid. Recent Labs  Lab 04/01/21 1138 04/01/21 1608 04/01/21 1944 04/02/21 0729 04/02/21 1118  GLUCAP 205* 369* 458* 128* 199*  -Continue Lantus 35 units twice daily -Continue SSI-high with night coverage. -Continue NovoLog 9 units 3 times daily with meals -Further adjustment as appropriate.   Microcytic anemia/thalassemia: H&H stable.  Hemoccult positive. Recent Labs    02/22/21 0000 03/20/21 1220 03/23/21 1301 03/26/21 0402 03/27/21 0351 03/28/21 0333 03/29/21 0342 03/31/21 0431 04/01/21 0837 04/02/21 0432  HGB 9.6* 9.5* 9.3* 8.1* 8.0* 8.1* 7.6* 7.8* 8.6* 8.9*  -GI planning EGD +/- colonoscopy on 6/20 -Monitor H&H. -SCD for VTE prophylaxis.     Paroxysmal atrial tachycardia/paroxysmal A. fib with RVR: RVR resolved. -Continue Cardizem -No  anticoagulation in the setting of positive Hemoccult/GI bleed. . At risk for sleep apnea -Needs outpatient sleep study.  History of rheumatoid arthritis: On methotrexate at home. Concerned about pneumonitis with MTX -Continue steroid as above.  Debility/generalized weakness -PT/OT.  Morbid obesity Body mass index is 39.73 kg/m.  -Encourage lifestyle change to lose weight.       DVT prophylaxis:  Place and maintain sequential compression device Start: 03/30/21 1930 SCDs Start: 03/23/21 2149  Code Status: Full code Family Communication: Patient and/or RN. Available if any question.  Level of care: Telemetry Status is: Inpatient  Remains inpatient appropriate because:Ongoing diagnostic testing needed not appropriate for outpatient work up, IV treatments appropriate due to intensity of illness or inability to take PO, and Inpatient level of care appropriate due to severity of illness  Dispo: The patient is from: Home              Anticipated d/c is to: Home              Patient currently is not medically stable to d/c.   Difficult to place patient No       Consultants:  Pulmonology Infectious disease Cardiology Gastroenterology   Sch Meds:  Scheduled Meds:  arformoterol  15 mcg Nebulization BID   atorvastatin  80 mg Oral q1800   diltiazem  240 mg Oral Daily   fluticasone  1 spray Each Nare Daily   hydrALAZINE  25 mg Oral Q8H   insulin aspart  0-20 Units Subcutaneous TID WC   insulin aspart  0-5 Units Subcutaneous QHS   insulin aspart  9 Units Subcutaneous TID WC   insulin glargine  35 Units Subcutaneous BID   loratadine  10 mg Oral Daily   losartan  100 mg Oral Daily   methylPREDNISolone (SOLU-MEDROL) injection  40 mg Intravenous Q24H   multivitamin with minerals  1 tablet Oral Daily   pantoprazole (PROTONIX) IV  40 mg Intravenous Q12H   revefenacin  175 mcg Nebulization Daily   Continuous Infusions:  lactated ringers     PRN Meds:.acetaminophen **OR**  acetaminophen, albuterol, guaiFENesin-dextromethorphan, HYDROcodone bit-homatropine, ondansetron (ZOFRAN) IV  Antimicrobials: Anti-infectives (From admission, onward)    Start     Dose/Rate Route Frequency Ordered Stop   04/01/21 1000  sulfamethoxazole-trimethoprim (BACTRIM DS) 800-160 MG per tablet 1 tablet  Status:  Discontinued        1 tablet Oral Daily 03/31/21 1700 03/31/21 1741   03/30/21 1407  cefTRIAXone (ROCEPHIN) 2 g in sodium chloride 0.9 % 100 mL IVPB  Status:  Discontinued        2 g 200 mL/hr over 30 Minutes Intravenous Every 24 hours 03/30/21 0909 03/31/21 1700   03/30/21 1300  sulfamethoxazole-trimethoprim (BACTRIM DS) 800-160 MG per tablet 1 tablet  Status:  Discontinued        1 tablet Oral Every 12 hours 03/30/21 1207 03/31/21 1700   03/26/21 1600  vancomycin (VANCOREADY) IVPB 1000 mg/200 mL  Status:  Discontinued        1,000 mg 200 mL/hr over 60 Minutes Intravenous Every 24 hours 03/25/21 1514 03/29/21 1556   03/25/21 1700  cefTRIAXone (ROCEPHIN) 2 g in sodium chloride 0.9 % 100 mL IVPB  Status:  Discontinued        2 g 200 mL/hr over 30 Minutes Intravenous Every 12 hours 03/25/21 1429 03/30/21 0909   03/25/21 1600  vancomycin (VANCOCIN) 2,250 mg in sodium chloride 0.9 % 500 mL IVPB        2,250 mg 250 mL/hr over 120 Minutes Intravenous STAT 03/25/21 1503 03/25/21 1815   03/25/21 1515  cefTRIAXone (ROCEPHIN) 1 g in sodium chloride 0.9 % 100 mL IVPB  Status:  Discontinued        1 g 200 mL/hr over 30 Minutes Intravenous Every 24 hours 03/25/21 1421 03/25/21 1429   03/24/21 1530  azithromycin (ZITHROMAX) tablet 500 mg  Status:  Discontinued        500 mg Oral Daily 03/24/21 1525 03/28/21 0942   03/24/21 0145  doxycycline (VIBRA-TABS) tablet 100 mg  Status:  Discontinued       Note to Pharmacy: Patient taking differently: Start date : 03/20/21     100 mg Oral 2 times daily 03/24/21 0131 03/24/21 1038        I have personally reviewed the following labs and  images: CBC: Recent Labs  Lab 03/27/21 0351 03/28/21 0333 03/29/21 0342 03/31/21 0431 04/01/21 0837 04/02/21 0432  WBC 18.0* 30.2* 25.6* 20.5* 23.0*  --   NEUTROABS  --   --   --   --  16.5*  --   HGB 8.0* 8.1* 7.6* 7.8* 8.6* 8.9*  HCT 27.2* 27.6* 26.0* 26.2* 29.0* 30.2*  MCV 63.6* 63.7* 64.5* 62.4* 62.5*  --   PLT 361 412* 370 338 356  --    BMP &GFR Recent Labs  Lab 03/27/21 1132 03/28/21  6568 03/29/21 0342 03/30/21 0341 03/31/21 0431 04/01/21 0837  NA 133* 133* 134*  --  136 134*  K 4.3 4.9 5.0 4.3 4.5 4.4  CL 97* 99 102  --  100 97*  CO2 _0 --  29 31  GLUCOSE 467* 379* 303*  --  295* 213*  BUN 22 25* 27*  --  24* 22  CREATININE 1.11* 1.16* 1.17*  --  1.17* 1.18*  CALCIUM 9.4 9.3 8.7*  --  8.9 9.1  MG  --   --   --   --   --  2.0  PHOS  --   --   --   --   --  3.9   Estimated Creatinine Clearance: 54.6 mL/min (A) (by C-G formula based on SCr of 1.18 mg/dL (H)). Liver & Pancreas: Recent Labs  Lab 03/27/21 0351 03/28/21 0333 03/29/21 0342 04/01/21 0837  AST _1 --   ALT _2 --   ALKPHOS 68 84 68  --   BILITOT 0.6 0.2* 0.2*  --   PROT 7.5 7.6 6.9  --   ALBUMIN 3.1* 3.3* 3.1* 2.9*   No results for input(s): LIPASE, AMYLASE in the last 168 hours. No results for input(s): AMMONIA in the last 168 hours. Diabetic: No results for input(s): HGBA1C in the last 72 hours. Recent Labs  Lab 04/01/21 1138 04/01/21 1608 04/01/21 1944 04/02/21 0729 04/02/21 1118  GLUCAP 205* 369* 458* 128* 199*   Cardiac Enzymes: No results for input(s): CKTOTAL, CKMB, CKMBINDEX, TROPONINI in the last 168 hours.  Recent Labs    03/20/21 1220  PROBNP 57.0   Coagulation Profile: No results for input(s): INR, PROTIME in the last 168 hours. Thyroid Function Tests: No results for input(s): TSH, T4TOTAL, FREET4, T3FREE, THYROIDAB in the last 72 hours. Lipid Profile: No results for input(s): CHOL, HDL, LDLCALC, TRIG, CHOLHDL, LDLDIRECT in the last 72  hours. Anemia Panel: No results for input(s): VITAMINB12, FOLATE, FERRITIN, TIBC, IRON, RETICCTPCT in the last 72 hours. Urine analysis:    Component Value Date/Time   COLORURINE YELLOW 03/23/2021 1732   APPEARANCEUR HAZY (A) 03/23/2021 1732   LABSPEC 1.017 03/23/2021 1732   PHURINE 5.0 03/23/2021 1732   GLUCOSEU NEGATIVE 03/23/2021 1732   HGBUR SMALL (A) 03/23/2021 1732   BILIRUBINUR NEGATIVE 03/23/2021 1732   KETONESUR NEGATIVE 03/23/2021 1732   PROTEINUR NEGATIVE 03/23/2021 1732   UROBILINOGEN 0.2 08/18/2008 1535   NITRITE NEGATIVE 03/23/2021 1732   LEUKOCYTESUR TRACE (A) 03/23/2021 1732   Sepsis Labs: Invalid input(s): PROCALCITONIN, [place]  Microbiology: Recent Results (from the past 240 hour(s))  Resp Panel by RT-PCR (Flu A&B, Covid) Nasopharyngeal Swab     Status: None   Collection Time: 03/23/21  5:32 PM   Specimen: Nasopharyngeal Swab; Nasopharyngeal(NP) swabs in vial transport medium  Result Value Ref Range Status   SARS Coronavirus 2 by RT PCR NEGATIVE NEGATIVE Final    Comment: (NOTE) SARS-CoV-2 target nucleic acids are NOT DETECTED.  The SARS-CoV-2 RNA is generally detectable in upper respiratory specimens during the acute phase of infection. The lowest concentration of SARS-CoV-2 viral copies this assay can detect is 138 copies/mL. A negative result does not preclude SARS-Cov-2 infection and should not be used as the sole basis for treatment or other patient management decisions. A negative result may occur with  improper specimen collection/handling, submission of specimen other than nasopharyngeal swab, presence of viral mutation(s) within the areas targeted by this assay, and  inadequate number of viral copies(<138 copies/mL). A negative result must be combined with clinical observations, patient history, and epidemiological information. The expected result is Negative.  Fact Sheet for Patients:  EntrepreneurPulse.com.au  Fact  Sheet for Healthcare Providers:  IncredibleEmployment.be  This test is no t yet approved or cleared by the [place] FDA and  has been authorized for detection and/or diagnosis of SARS-CoV-2 by FDA under an Emergency Use Authorization (EUA). This EUA will remain  in effect (meaning this test can be used) for the duration of the COVID-19 declaration under Section 564(b)(1) of the Act, 21 U.S.C.section 360bbb-3(b)(1), unless the authorization is terminated  or revoked sooner.       Influenza A by PCR NEGATIVE NEGATIVE Final   Influenza B by PCR NEGATIVE NEGATIVE Final    Comment: (NOTE) The Xpert Xpress SARS-CoV-2/FLU/RSV plus assay is intended as an aid in the diagnosis of influenza from Nasopharyngeal swab specimens and should not be used as a sole basis for treatment. Nasal washings and aspirates are unacceptable for Xpert Xpress SARS-CoV-2/FLU/RSV testing.  Fact Sheet for Patients: EntrepreneurPulse.com.au  Fact Sheet for Healthcare Providers: IncredibleEmployment.be  This test is not yet approved or cleared by the [place] FDA and has been authorized for detection and/or diagnosis of SARS-CoV-2 by FDA under an Emergency Use Authorization (EUA). This EUA will remain in effect (meaning this test can be used) for the duration of the COVID-19 declaration under Section 564(b)(1) of the Act, 21 U.S.C. section 360bbb-3(b)(1), unless the authorization is terminated or revoked.  Performed at [facility], [place] [address], [place], [place] [address]   Urine culture     Status: Abnormal   Collection Time: 03/23/21  5:32 PM   Specimen: Urine, Clean Catch  Result Value Ref Range Status   Specimen Description   Final    URINE, CLEAN CATCH Performed at [facility], [place] [address], [place], [place] [address]    Special Requests   Final    NONE Performed at [facility], [place] [address], [place], [place] [address]    Culture (A)  Final    <10,000 COLONIES/mL INSIGNIFICANT GROWTH Performed at [place] [address] [place], [place] [address]    Report Status 03/25/2021 FINAL  Final  Culture, blood (Routine X 2) w Reflex to ID Panel     Status: None   Collection Time: 03/24/21  2:25 AM   Specimen: BLOOD  Result Value Ref Range Status   Specimen Description   Final    BLOOD BLOOD LEFT FOREARM Performed at [place] [address] [place], [place] [address]    Special Requests   Final    BOTTLES DRAWN AEROBIC AND ANAEROBIC Blood Culture adequate volume Performed at [place] [address] [place], [place] [address]    Culture   Final    NO GROWTH 5 DAYS Performed at [facility], [place] [address] [place], [place] [address]    Report Status 03/29/2021 FINAL  Final  Culture, blood (Routine X 2) w Reflex to ID Panel     Status: None   Collection Time: 03/24/21  2:30 AM   Specimen: BLOOD  Result Value Ref Range Status   Specimen Description   Final    BLOOD BLOOD RIGHT HAND Performed at [place] [address] [place], [place] [address]    Special Requests   Final    BOTTLES  DRAWN AEROBIC AND ANAEROBIC Blood Culture adequate volume Performed at [place] [address] [place], [place] [address]    Culture   Final    NO GROWTH 5 DAYS Performed at [facility], [place] [address], [place], [place] [address]    Report Status 03/29/2021 FINAL  Final  Culture, blood (routine x 2)     Status: None   Collection Time: 03/24/21 11:30 AM   Specimen: BLOOD  Result Value Ref Range Status   Specimen Description   Final    BLOOD LEFT ANTECUBITAL Performed at [facility], [place] [address], [place], [place] [address]    Special Requests   Final    BOTTLES DRAWN AEROBIC AND ANAEROBIC Blood Culture adequate volume Performed at  [place] [address] [place], [place] [address]    Culture   Final    NO GROWTH 5 DAYS Performed at [facility], [place] [address] [place], [place] [address]    Report Status 03/29/2021 FINAL  Final  MRSA PCR Screening     Status: None   Collection Time: 03/25/21  1:27 PM   Specimen: Nasal Mucosa; Nasopharyngeal  Result Value Ref Range Status   MRSA by PCR NEGATIVE NEGATIVE Final    Comment:        The GeneXpert MRSA Assay (FDA approved for NASAL specimens only), is one component of a comprehensive MRSA colonization surveillance program. It is not intended to diagnose MRSA infection nor to guide or monitor treatment for MRSA infections. Performed at [facility], [place] [address], [place], [place] [address]   Respiratory (~20 pathogens) panel by PCR     Status: None   Collection Time: 03/26/21  7:50 AM  Result Value Ref Range Status   Adenovirus NOT DETECTED NOT DETECTED Final   Coronavirus 229E NOT DETECTED NOT DETECTED Final    Comment: (NOTE) The Coronavirus on the Respiratory Panel, DOES NOT test for the novel  Coronavirus (2019 nCoV)    Coronavirus HKU1 NOT DETECTED NOT DETECTED Final   Coronavirus NL63 NOT DETECTED NOT DETECTED Final   Coronavirus OC43 NOT DETECTED NOT DETECTED Final   Metapneumovirus NOT DETECTED NOT DETECTED Final   Rhinovirus / Enterovirus NOT DETECTED NOT DETECTED Final   Influenza A NOT DETECTED NOT DETECTED Final   Influenza B NOT DETECTED NOT DETECTED Final   Parainfluenza Virus 1 NOT DETECTED NOT DETECTED Final   Parainfluenza Virus 2 NOT DETECTED NOT DETECTED Final   Parainfluenza Virus 3 NOT DETECTED NOT DETECTED Final   Parainfluenza Virus 4 NOT DETECTED NOT DETECTED Final   Respiratory Syncytial Virus NOT DETECTED NOT DETECTED Final   Bordetella pertussis NOT DETECTED NOT DETECTED Final   Bordetella Parapertussis NOT DETECTED NOT DETECTED Final   Chlamydophila pneumoniae NOT DETECTED  NOT DETECTED Final   Mycoplasma pneumoniae NOT DETECTED NOT DETECTED Final    Comment: Performed at [facility], [place]. [address] [place], [place] [address]  Culture, Respiratory w Gram Stain     Status: None   Collection Time: 03/28/21  2:10 PM   Specimen: Bronchoalveolar Lavage; Respiratory  Result Value Ref Range Status   Specimen Description BRONCHIAL ALVEOLAR LAVAGE  Final   Special Requests NONE  Final   Gram Stain   Final    FEW WBC PRESENT, PREDOMINANTLY MONONUCLEAR NO ORGANISMS SEEN    Culture   Final    NO GROWTH 2 DAYS Performed at [facility], [address], [place], [place] [address]    Report Status 03/31/2021 FINAL  Final  Acid Fast Smear (AFB)     Status: None   Collection Time: 03/28/21  2:10 PM   Specimen: Bronchial Alveolar Lavage  Result Value Ref Range Status   AFB Specimen Processing Concentration  Final   Acid Fast Smear Negative  Final    Comment: (NOTE) Performed At: [facility] [place], [place] [id number] Rush [place] MD [id number]    Source (AFB) BRONCHIAL ALVEOLAR LAVAGE  Final    Comment: RML Performed at [facility], [place] [address], [place], [place] [address]   Fungus Culture With Stain     Status: None (Preliminary result)   Collection Time: 03/28/21  2:10 PM   Specimen: Bronchial Alveolar Lavage  Result Value Ref Range Status   Fungus Stain Final report  Final    Comment: (NOTE) Performed At: [facility] [place], [place] [id number] Rush Farmer MD [id number]    Fungus (Mycology) Culture PENDING  Incomplete   Fungal Source BRONCHIAL ALVEOLAR LAVAGE  Final    Comment: RML Performed at [facility], [place] [address] [place], [place] [address]   Fungus Culture Result     Status: None   Collection Time: 03/28/21  2:10 PM  Result Value Ref Range Status   Result 1 Comment  Final    Comment: (NOTE) KOH/Calcofluor preparation:  no fungus observed. Performed  At: [facility] [place], [place] [id number] Rush Farmer MD [id number]     Radiology Studies: No results found.    Jonia Oakey T. [place]  If 7PM-7AM, please contact night-coverage [email or web address] 04/02/2021, 11:59 AM

## 2021-04-02 NOTE — Progress Notes (Signed)
Patient walked entire length of unit (180 feet) 93% at rest (in bed), 90% with ambulation, best with ambulation 93%,  worst with ambulation 88% for a few seconds then immediately back up to 90%.  Patient remained at 90% for vast majority of walk.   Patient did become dyspneic but this improved with approximately 10 seconds rest.  Patient back up to 93% on room air at rest.

## 2021-04-02 NOTE — H&P (View-Only) (Signed)
[facility] Progress Note  CC:  GIB/melena   Subjective: She feels well this morning.  She is no longer using oxygen.  No cough or shortness of breath.  No chest pain.  She is tolerating a regular diet.  She passed a normal formed brown bowel movement this morning.  No rectal bleeding or black stools.  No complaints at this time.   Objective:  Vital signs in last 24 hours: Temp:  [98.1 F (36.7 C)-98.9 F (37.2 C)] 98.1 F (36.7 C) (06/19 0534) Pulse Rate:  [67-70] 67 (06/19 0534) Resp:  [16-22] 16 (06/19 0534) BP: (150-156)/(69-83) 150/83 (06/19 0534) SpO2:  [93 %-98 %] 96 % (06/19 0831) Weight:  [105 kg] 105 kg (06/19 0500) Last BM Date: 04/02/21 General:   Alert 65 year old female in no acute distress. Heart: Regular rate and rhythm, no murmurs. Pulm: Lungs clear throughout without wheezing, rhonchi or crackles. Abdomen: Soft, nondistended.  Nontender.  Positive bowel sounds to all 4 quadrants. Extremities:  Without edema. Neurologic:  Alert and  oriented x4;  grossly normal neurologically. Psych:  Alert and cooperative. Normal mood and affect.  Intake/Output from previous day: 06/18 0701 - 06/19 0700 In: 420 [P.O.:420] Out: -  Intake/Output this shift: No intake/output data recorded.  Lab Results: Recent Labs    03/31/21 0431 04/01/21 0837 04/02/21 0432  WBC 20.5* 23.0*  --   HGB 7.8* 8.6* 8.9*  HCT 26.2* 29.0* 30.2*  PLT 338 356  --    BMET Recent Labs    03/31/21 0431 04/01/21 0837  NA 136 134*  K 4.5 4.4  CL 100 97*  CO2 29 31  GLUCOSE 295* 213*  BUN 24* 22  CREATININE 1.17* 1.18*  CALCIUM 8.9 9.1   LFT Recent Labs    04/01/21 0837  ALBUMIN 2.9*   PT/INR No results for input(s): LABPROT, INR in the last 72 hours. Hepatitis Panel No results for input(s): HEPBSAG, HCVAB, HEPAIGM, HEPBIGM in the last 72 hours.  No results found.  Assessment / Plan:  22. 65 year old female with past medical history of sickle cell anemia  was admitted to the hospital with acute respiratory failure and subsequently developed melenic stools 2 days ago. No abdominal pain. Admission Hg 9.3 -> 7.8.  Positive FOBT. -Proceed with EGD 6/20 as her cardiopulmonary status has stabilized.  EGD benefits and risks discussed including risk with sedation, risk of bleeding, perforation and infection.  -NPO after midnight -Continue pantoprazole 40 mg IV twice daily -BMP, CBC in a.m.   2. Acute hypoxemic respiratory failure, significantly improved.  No longer on oxygen nasal cannula.   3. Atrial fibrillation with RVR, cardioverted to sinus rhythm after diltiazem IV administered. Remains on oral diltiazem. Not on anticoagulation.   4. Posterolateral mitral annulus mass consistent with degenerative mitral annular calcification per cardiac MRI   5. DM II    Principal Problem:   Acute respiratory failure with hypoxia (HCC) Active Problems:   Essential hypertension   Chronic diastolic CHF (congestive heart failure) (HCC)   Type 2 diabetes mellitus with hyperglycemia, with long-term current use of insulin (HCC)   Acute bronchitis   SOB (shortness of breath)   FUO (fever of unknown origin)   Hypokalemia   Generalized weakness   Acute bacterial endocarditis   ILD (interstitial lung disease) (HCC)   Occult GI bleeding   Melena   Preop respiratory exam     LOS: 10 days   Noralyn Pick  04/02/2021, 8:51 AM

## 2021-04-02 NOTE — Progress Notes (Addendum)
NAME:  Lynn [name], MRN:  [id number], DOB:  03-20-1956, LOS: 38 ADMISSION DATE:  03/23/2021, CONSULTATION DATE:  03/25/2021 REFERRING MD:  Landis Gandy, MD CHIEF COMPLAINT: Respiratory failure, fevers   History of Present Illness:  65 year old woman with PMHx significant for HTN, HFpEF (Echo 03/2021 EF 55-60%), T2DM, chronic anemia 2/2 thalassemia, osteoarthritis (s/p bilateral TKA) and rheumatoid arthritis (on methotrexate) admitted to [facility] 03/23/21 with acute hypoxemic respiratory failure.   Patient was recently treated with doxycycline and azithromycin for presumed bronchitis. Febrile this admission to Tmax of 101.65F with dry cough. CTA Chest 6/7 demonstrating scattered diffuse areas of ground glass attenuation, negative for pulmonary emboli. COVID and influenza PCRs negative.   PCCM consulted for further evaluation/recommendations for respiratory failure and fevers.  Pertinent Medical History:  Rheumatoid Arthritis Chronic Diastolic Heart Failure Hypertension Microcytic Anemia - Thalassemia DMII  Significant Hospital Events: Including procedures, antibiotic start and stop dates in addition to other pertinent events   Admitted 03/23/2021 6/11 with increasing oxygen needs to 5L 6/14 - Bronch BAL - 64% lymphs, 32% mono, Cytology without malig cells. Culture negative as of 6/16.  Started Solu-Medrol.  Gives history of methotrexate injection and also feather pillow exposure 6/16 - CT Chest completed with GGOs, negative for PE. Cardiac MRI demonstrating mitral annular mass c/w degenerative mitral annular calcification. Melena noted in PM, FOBT+. 6/17 - GI consulted for melena/Hgb drift. Plan for EGD pending pulmonary clearance for procedure. Ceftriaxone started.  3 L oxygen need  Interim History / Subjective:    6/19 - downt to room air.  Walking desaturation test showed that she had a pulse ox greater than equal to 88% at all times.  She got dyspneic.  There is a remarkable  improvement.  Currently on Solu-Medrol 40 mg/day.  Awaiting inpatient EGD tomorrow.  Recommendation is for this to be done with the help of anesthesia  Objective   Blood pressure (!) 150/83, pulse 67, temperature 98.1 F (36.7 C), temperature source Oral, resp. rate 16, height _0  (1.626 m), weight 105 kg, SpO2 96 %.        Intake/Output Summary (Last 24 hours) at 04/02/2021 1203 Last data filed at 04/01/2021 1805 Gross per 24 hour  Intake 300 ml  Output --  Net 300 ml   Filed Weights   03/31/21 0455 04/01/21 0500 04/02/21 0500  Weight: 107.2 kg 108.5 kg 105 kg   Physical Examination: Obese lady.  Sitting and watching TV.  Talking on the phone as well.  Clear to auscultation bilaterally normal heart sounds.  Alert and oriented x3 abdomen obese and soft.  No sinus no clubbing no edema.  No crackles.  Labs/imaging that I have personally reviewed:  (right click and "Reselect all SmartList Selections" daily)  WBC 20.5 (25.6), H&H 7.8/26.2, Plt 338  Na 136, K 4.5, Cl 100, CO2 29, BUN 24, Cr 1.17 Ca 8.9  Glucoses 267-349 last 24H  ESR 62 (68)  Cardiac MRI 6/16 IMPRESSION: 1.  Normal right and left ventricular size and function, LVEF 53%.  2. Posterolateral mitral annulus mass consistent with degenerative mitral annular calcification (hypointense on T1, T2 and STIR sequences, no LGE).  3. I reviewed today's High resolution chest CT which confirmed calcification of mitral annular mass as above.  4.  No scar or late gadolinium enhancement noted on LV myocardium.  CT Chest Hi-Res 6/16 IMPRESSION: 1. Marked increase in upper/midlung zone predominant peribronchovascular ground-glass, interstitial thickening and scattered collapse/consolidation when compared with 03/21/2021. Findings are likely  due to an atypical/viral pneumonia, including due to COVID-19. Acute drug toxicity is not excluded. Findings are suggestive of an alternative diagnosis (not UIP) per consensus guidelines:  Diagnosis of Idiopathic Pulmonary Fibrosis: An Official ATS/ERS/JRS/ALAT Clinical Practice Guideline. [place], Iss 5, [phone], Jun 15 2017. 2. Trace right pleural fluid. 3. Aortic atherosclerosis (ICD10-I70.0). Coronary artery calcification. 4. Enlarged pulmonic trunk, indicative of pulmonary arterial hypertension.  Results for VIRA, CHAPLIN "PAT" (MRN [id number]) as of 04/02/2021 12:05  Ref. Range 03/25/2021 11:26 03/26/2021 14:33  ANCA Proteinase 3 Latest Ref Range: 0.0 - 3.5 U/mL <3.5   CCP Antibodies IgG/IgA Latest Ref Range: 0 - 19 units  11  Myeloperoxidase Abs Latest Ref Range: 0.0 - 9.0 U/mL <9.0   RA Latex Turbid. Latest Ref Range: <14.0 IU/mL  21.8 (H)  Cytoplasmic (C-ANCA) Latest Ref Range: Neg:<1:20 titer <1:20   P-ANCA Latest Ref Range: Neg:<1:20 titer <1:20   Atypical P-ANCA titer Latest Ref Range: Neg:<1:20 titer <1:20     Resolved Hospital Problem list     Assessment & Plan:  Acute hypoxemic respiratory failure secondary to interstitial lung disease, oxygen-dependent - presnet on admit. Alternative UIP per high-resolution CT chest 03/30/2021 - UL predominance. Predominant lymphocytosis greater than 60% on BAL; this is typically consistent with methotrexate hypersensitive pneumonitis (anecdotally seen it more with Subcut) or feather pillow hypersensitive pneumonitis or combination; rarely it can be L IP from rheumatoid arthritis.  Preoperative pulmonary consultation  04/02/2021: Significant improvement.  She is no longer requiring oxygen.  Infectious disease consultation on 03/31/2021 recommending Bactrim prophylaxis if she is going to be on greater than 20 mg/day for greater than 2 months  Plan -Glad she got rid of feather pillow - She is to discuss with rheumatologist to seek alternative options to methotrexate - Can go home without oxygen - On 04/03/2021: Check G6PD [for some reason this got canceled out on 04/01/2021] in view of  potential Bactrim prophylaxis need, check QuantiFERON gold, check hypersensitive pneumonitis panel -She will need prednisone taper over 2-3 months  -Prednisone 40 mg/day starting 04/03/2021 x2 weeks and then prednisone 30 mg/day x 2 weeks and then prednisone 20 mg/day x 2 weeks and then prednisone 10 mg/day x 2 weeks and then prednisone 5 mg/day x 2 weeks and then prednisone 5 mg on Monday Wednesday Friday x2 weeks and then prednisone 5 mg once a week x2 weeks and then stop  -Given GI bleed that happened in the setting of starting steroids: We will need H2 blockade/PPI to continue pending GI recommendations following endoscopy - Still recommend endoscopy tomorrow 04/03/2021 under anesthesia  -She will follow-up with interstitial lung disease center at the pulmonary clinic with Dr. Buren Kos practitioner Eston Esters sent] -during which time walk test and CT scan chest can be arranged based on clinical course   -CCM will sign off  Best Practice: (right click and "Reselect all SmartList Selections" daily)  Per Primary Team     SIGNATURE    Dr. Brand Males, M.D., F.C.C.P,  Pulmonary and Critical Care Medicine Staff Physician, [place] Director - Interstitial Lung Disease  Program  [facility] [place], [place], [address]  Pager: [phone], If no answer  OR between  19:00-7:00h: page [phone] Telephone (clinical office): [phone] Telephone (research): [phone]  12:10 PM 04/02/2021

## 2021-04-03 ENCOUNTER — Inpatient Hospital Stay (HOSPITAL_COMMUNITY): Payer: Medicare Other | Admitting: Certified Registered Nurse Anesthetist

## 2021-04-03 ENCOUNTER — Encounter (HOSPITAL_COMMUNITY)
Admission: EM | Disposition: A | Discharge status: Home / Self Care | Payer: Self-pay | Source: Home / Self Care | Attending: Internal Medicine

## 2021-04-03 DIAGNOSIS — D649 Anemia, unspecified: Secondary | ICD-10-CM

## 2021-04-03 DIAGNOSIS — K250 Acute gastric ulcer with hemorrhage: Secondary | ICD-10-CM | POA: Diagnosis not present

## 2021-04-03 DIAGNOSIS — J9601 Acute respiratory failure with hypoxia: Secondary | ICD-10-CM | POA: Diagnosis not present

## 2021-04-03 DIAGNOSIS — K259 Gastric ulcer, unspecified as acute or chronic, without hemorrhage or perforation: Secondary | ICD-10-CM

## 2021-04-03 DIAGNOSIS — E1165 Type 2 diabetes mellitus with hyperglycemia: Secondary | ICD-10-CM | POA: Diagnosis not present

## 2021-04-03 DIAGNOSIS — I5032 Chronic diastolic (congestive) heart failure: Secondary | ICD-10-CM | POA: Diagnosis not present

## 2021-04-03 DIAGNOSIS — J679 Hypersensitivity pneumonitis due to unspecified organic dust: Principal | ICD-10-CM

## 2021-04-03 DIAGNOSIS — K2210 Ulcer of esophagus without bleeding: Secondary | ICD-10-CM

## 2021-04-03 HISTORY — PX: ESOPHAGOGASTRODUODENOSCOPY (EGD) WITH PROPOFOL: SHX5813

## 2021-04-03 HISTORY — PX: BIOPSY: SHX5522

## 2021-04-03 LAB — PROTEIN ELECTROPHORESIS, SERUM
A/G Ratio: 0.8 (ref 0.7–1.7)
Albumin ELP: 3.3 g/dL (ref 2.9–4.4)
Alpha-1-Globulin: 0.3 g/dL (ref 0.0–0.4)
Alpha-2-Globulin: 1.1 g/dL — ABNORMAL HIGH (ref 0.4–1.0)
Beta Globulin: 0.9 g/dL (ref 0.7–1.3)
Gamma Globulin: 1.9 g/dL — ABNORMAL HIGH (ref 0.4–1.8)
Globulin, Total: 4.2 g/dL — ABNORMAL HIGH (ref 2.2–3.9)
M-Spike, %: 0.8 g/dL — ABNORMAL HIGH
Total Protein ELP: 7.5 g/dL (ref 6.0–8.5)

## 2021-04-03 LAB — BASIC METABOLIC PANEL
Anion gap: 8 (ref 5–15)
BUN: 27 mg/dL — ABNORMAL HIGH (ref 8–23)
CO2: 29 mmol/L (ref 22–32)
Calcium: 9.3 mg/dL (ref 8.9–10.3)
Chloride: 99 mmol/L (ref 98–111)
Creatinine, Ser: 1.1 mg/dL — ABNORMAL HIGH (ref 0.44–1.00)
GFR, Estimated: 55 mL/min — ABNORMAL LOW (ref >60)
Glucose, Bld: 102 mg/dL — ABNORMAL HIGH (ref 70–99)
Potassium: 4.3 mmol/L (ref 3.5–5.1)
Sodium: 136 mmol/L (ref 135–145)

## 2021-04-03 LAB — GLUCOSE, CAPILLARY
Glucose-Capillary: 100 mg/dL — ABNORMAL HIGH (ref 70–99)
Glucose-Capillary: 126 mg/dL — ABNORMAL HIGH (ref 70–99)
Glucose-Capillary: 139 mg/dL — ABNORMAL HIGH (ref 70–99)
Glucose-Capillary: 328 mg/dL — ABNORMAL HIGH (ref 70–99)

## 2021-04-03 LAB — CBC
HCT: 30.8 % — ABNORMAL LOW (ref 36.0–46.0)
Hemoglobin: 9.1 g/dL — ABNORMAL LOW (ref 12.0–15.0)
MCH: 18.8 pg — ABNORMAL LOW (ref 26.0–34.0)
MCHC: 29.5 g/dL — ABNORMAL LOW (ref 30.0–36.0)
MCV: 63.6 fL — ABNORMAL LOW (ref 80.0–100.0)
Platelets: 336 10*3/uL (ref 150–400)
RBC: 4.84 MIL/uL (ref 3.87–5.11)
RDW: 19 % — ABNORMAL HIGH (ref 11.5–15.5)
WBC: 21.1 10*3/uL — ABNORMAL HIGH (ref 4.0–10.5)
nRBC: 0.5 % — ABNORMAL HIGH (ref 0.0–0.2)

## 2021-04-03 LAB — UPEP/UIFE/LIGHT CHAINS/TP, 24-HR UR
% BETA, Urine: 0 %
ALPHA 1 URINE: 0 %
Albumin, U: 100 %
Alpha 2, Urine: 0 %
Free Kappa Lt Chains,Ur: 110.47 mg/L — ABNORMAL HIGH (ref 1.17–86.46)
Free Kappa/Lambda Ratio: 4.04 (ref 1.83–14.26)
Free Lambda Lt Chains,Ur: 27.33 mg/L — ABNORMAL HIGH (ref 0.27–15.21)
GAMMA GLOBULIN URINE: 0 %
Total Protein, Urine-Ur/day: 174 mg/24 hr — ABNORMAL HIGH (ref 30–150)
Total Protein, Urine: 7.9 mg/dL
Total Volume: 2200

## 2021-04-03 LAB — PHOSPHORUS: Phosphorus: 3.9 mg/dL (ref 2.5–4.6)

## 2021-04-03 LAB — MAGNESIUM: Magnesium: 2 mg/dL (ref 1.7–2.4)

## 2021-04-03 LAB — CYTOLOGY - NON PAP

## 2021-04-03 SURGERY — ESOPHAGOGASTRODUODENOSCOPY (EGD) WITH PROPOFOL
Anesthesia: Monitor Anesthesia Care

## 2021-04-03 MED ORDER — LACTATED RINGERS IV SOLN: INTRAVENOUS | Status: DC

## 2021-04-03 MED ORDER — PANTOPRAZOLE SODIUM 40 MG PO TBEC: 40.0000 mg | DELAYED_RELEASE_TABLET | Freq: Two times a day (BID) | ORAL | 0 refills | Status: DC

## 2021-04-03 MED ORDER — ANORO ELLIPTA 62.5-25 MCG/INH IN AEPB: 1.0000 | INHALATION_SPRAY | Freq: Every day | RESPIRATORY_TRACT | 1 refills | Status: DC

## 2021-04-03 MED ORDER — PREDNISONE 10 MG PO TABS: ORAL_TABLET | ORAL | 0 refills | Status: DC

## 2021-04-03 MED ORDER — ONDANSETRON HCL 4 MG/2ML IJ SOLN
INTRAMUSCULAR | Status: DC | PRN
  Administered 2021-04-03: 4 mg via INTRAVENOUS

## 2021-04-03 MED ORDER — PROPOFOL 500 MG/50ML IV EMUL
INTRAVENOUS | Status: AC
  Filled 2021-04-03: qty 150

## 2021-04-03 MED ORDER — PROPOFOL 500 MG/50ML IV EMUL
INTRAVENOUS | Status: DC | PRN
  Administered 2021-04-03: 100 ug/kg/min via INTRAVENOUS

## 2021-04-03 MED ORDER — ALBUTEROL SULFATE HFA 108 (90 BASE) MCG/ACT IN AERS: 2.0000 | INHALATION_SPRAY | Freq: Four times a day (QID) | RESPIRATORY_TRACT | 2 refills | Status: DC | PRN

## 2021-04-03 MED ORDER — SULFAMETHOXAZOLE-TRIMETHOPRIM 800-160 MG PO TABS: 1.0000 | ORAL_TABLET | Freq: Every day | ORAL | 0 refills | Status: DC

## 2021-04-03 MED ORDER — PROPOFOL 500 MG/50ML IV EMUL
INTRAVENOUS | Status: DC | PRN
  Administered 2021-04-03: 20 mg via INTRAVENOUS
  Administered 2021-04-03 (×2): 50 mg via INTRAVENOUS

## 2021-04-03 SURGICAL SUPPLY — 15 items

## 2021-04-03 NOTE — Op Note (Signed)
[facility] Patient Name: Lynn [name] Procedure Date: 04/03/2021 MRN: [id number] Attending MD: Carlota Raspberry. Havery Moros , MD Date of Birth: Apr 23, 1956 CSN: [id number] Age: 65 Admit Type: Inpatient Procedure:                Upper GI endoscopy Indications:              Acute post hemorrhagic anemia, Melena - patient                            given empiric PPI, symptoms improving Providers:                Carlota Raspberry. Havery Moros, MD, Josie Dixon, RN,                            Elspeth Cho Tech., Technician Referring MD:              Medicines:                Monitored Anesthesia Care Complications:            No immediate complications. Estimated blood loss:                            Minimal. Estimated Blood Loss:     Estimated blood loss was minimal. Procedure:                Pre-Anesthesia Assessment:                           - Prior to the procedure, a History and Physical                            was performed, and patient medications and                            allergies were reviewed. The patient's tolerance of                            previous anesthesia was also reviewed. The risks                            and benefits of the procedure and the sedation                            options and risks were discussed with the patient.                            All questions were answered, and informed consent                            was obtained. Prior Anticoagulants: The patient has                            taken no previous anticoagulant or antiplatelet  agents. ASA Grade Assessment: III - A patient with                            severe systemic disease. After reviewing the risks                            and benefits, the patient was deemed in                            satisfactory condition to undergo the procedure.                           After obtaining informed consent, the endoscope was                             passed under direct vision. Throughout the                            procedure, the patient's blood pressure, pulse, and                            oxygen saturations were monitored continuously. The                            [id number]-H190 ([id number]) Olympus gastroscope was                            introduced through the mouth, and advanced to the                            second part of duodenum. The upper GI endoscopy was                            accomplished without difficulty. The patient                            tolerated the procedure well. Scope In: Scope Out: Findings:      Esophagogastric landmarks were identified: the Z-line was found at 34       cm, the gastroesophageal junction was found at 34 cm and the upper       extent of the gastric folds was found at 35 cm from the incisors.      A 1 cm hiatal hernia was present.      One superficial esophageal erosion vs. small ulcer (59m) with no       bleeding and no stigmata of recent bleeding was found 34 cm from the       incisors.      The exam of the esophagus was otherwise normal.      Two non-bleeding clean based cratered gastric ulcers with no stigmata of       bleeding were found in the gastric antrum. They were small, a few mm in       size, with heaped up margins. Biopsies were taken with a cold forceps       for histology from the periphery of the  lesion.      The exam of the stomach was otherwise normal. No heme noted anywhere      Biopsies were taken with a cold forceps in the gastric body, at the       incisura and in the gastric antrum for Helicobacter pylori testing.      The duodenal bulb and second portion of the duodenum were normal. Impression:               - Esophagogastric landmarks identified.                           - 1 cm hiatal hernia.                           - Esophageal erosion / ulcer with no bleeding and                            no stigmata of recent bleeding.                           -  2 Non-bleeding small gastric ulcers with no                            stigmata of bleeding. Biopsied.                           - Normal stomach otherwise - no heme noted                            anywhere. Biopsies taken to rule out H pylori                           - Normal duodenal bulb and second portion of the                            duodenum.                           Suspect symptoms likely due to gastric / esophageal                            ulcer, appear to be healing and would think low                            risk for rebleeding Moderate Sedation:      No moderate sedation, case performed with MAC Recommendation:           - Return patient to hospital ward for ongoing care.                           - Advance diet as tolerated.                           - Continue present medications.                           -  Okay to switch IV protonix to oral protonix 3m                            BID                           - Continue oral protonix 462mBID for one month,                            then switch to once daily                           - Minimize / avoid NSAID use if possible                           - Await pathology results.                           - Trend Hgb, monitor for recurrent bleeding (stools                            seem to be lightening up)                           - Our office will coordinate outpatient follow up                            with [place] discharge from the hospital                           - GI service will sign off at this time, please                            call questions moving forward Procedure Code(s):        --- Professional ---                           [id number]py, flexible,                            transoral; with biopsy, single or multiple Diagnosis Code(s):        --- Professional ---                           K44.9, Diaphragmatic hernia without obstruction or                             gangrene                           K22.10, Ulcer of esophagus without bleeding                           K25.9, Gastric ulcer, unspecified as acute or  chronic, without hemorrhage or perforation                           D62, Acute posthemorrhagic anemia                           K92.1, Melena (includes Hematochezia) CPT copyright 2019 American Medical Association. All rights reserved. The codes documented in this report are preliminary and upon coder review may  be revised to meet current compliance requirements. Remo Lipps P. Havery Moros, MD 04/03/2021 1:24:19 PM This report has been signed electronically. Number of Addenda: 0

## 2021-04-03 NOTE — Progress Notes (Signed)
SATURATION QUALIFICATIONS: (This note is used to comply with regulatory documentation for home oxygen)  Patient Saturations on Room Air at Rest  93%  Patient Saturations on Room Air while Ambulating = 87%  Patient Saturations on 2 Liters of oxygen while Ambulating = 93%  Please briefly explain why patient needs home oxygen: patient desats with ambulation

## 2021-04-03 NOTE — TOC Progression Note (Signed)
Transition of Care [facility]) - Progression Note    Patient Details  Name: Lynn [name] MRN: [id number] Date of Birth: 1956/03/17  [facility] CM/SW Contact  Purcell Mouton, RN Phone Number: 04/03/2021, 3:04 PM  Clinical Narrative:     Referral for Oxygen and BSC given to [employer] in house rep. Pt is aware.        Expected Discharge Plan and Services           Expected Discharge Date: 04/03/21                                     Social Determinants of Health (SDOH) Interventions    Readmission Risk Interventions No flowsheet data found.

## 2021-04-03 NOTE — Interval H&P Note (Signed)
History and Physical Interval Note: Patient scheduled for EGD today. She is hemodynamically stable. Had a BM today which has cleared up a bit from prior dark stools. We have discussed risks / benefits of EGD and anesthesia and she wishes to proceed. Further recommendations pending results of her EGD. She agrees with the plan, all questions answered.  04/03/2021 12:58 PM  Lynn [name]  has presented today for surgery, with the diagnosis of Melena, GI bleed.  The various methods of treatment have been discussed with the patient and family. After consideration of risks, benefits and other options for treatment, the patient has consented to  Procedure(s): ESOPHAGOGASTRODUODENOSCOPY (EGD) WITH PROPOFOL (N/A) as a surgical intervention.  The patient's history has been reviewed, patient examined, no change in status, stable for surgery.  I have reviewed the patient's chart and labs.  Questions were answered to the patient's satisfaction.     [place]

## 2021-04-03 NOTE — Anesthesia Procedure Notes (Signed)
Procedure Name: MAC Date/Time: 04/03/2021 1:00 PM Performed by: Jari Pigg, CRNA Pre-anesthesia Checklist: Patient identified, Emergency Drugs available, Suction available, Patient being monitored and Timeout performed Patient Re-evaluated:Patient Re-evaluated prior to induction Oxygen Delivery Method: Simple face mask

## 2021-04-03 NOTE — Transfer of Care (Signed)
Immediate Anesthesia Transfer of Care Note  Patient: Zury Fazzino  Procedure(s) Performed: ESOPHAGOGASTRODUODENOSCOPY (EGD) WITH PROPOFOL BIOPSY  Patient Location: Endoscopy Unit  Anesthesia Type:MAC  Level of Consciousness: drowsy  Airway & Oxygen Therapy: Patient Spontanous Breathing  Post-op Assessment: Report given to RN and Post -op Vital signs reviewed and stable  Post vital signs: Reviewed and stable  Last Vitals:  Vitals Value Taken Time  BP 128/57 04/03/21 1323  Temp 36.6 C 04/03/21 1323  Pulse 78 04/03/21 1327  Resp 23 04/03/21 1327  SpO2 94 % 04/03/21 1327  Vitals shown include unvalidated device data.  Last Pain:  Vitals:   04/03/21 1323  TempSrc: Oral  PainSc: 0-No pain      Patients Stated Pain Goal: 3 (57/89/78 4784)  Complications: No notable events documented.

## 2021-04-03 NOTE — Progress Notes (Signed)
PT Cancellation Note  Patient Details Name: Lynn [name] MRN: [id number] DOB: 1956-02-15   Cancelled Treatment:    Reason Eval/Treat Not Completed: Other (comment); pt states she amb with nursing staff. Reports she needed multiple rest breaks and was holding on to rail at times, amb on RA today;    [facility] 04/03/2021, 3:34 PM

## 2021-04-03 NOTE — Plan of Care (Signed)
Discharge instructions reviewed with patient, questions answered, verbalized understanding. Patient transported to main entrance to be taken home by husband with BSC and oxygen.

## 2021-04-03 NOTE — Progress Notes (Signed)
PROGRESS NOTE  Lynn [name] JJK:[id number] DOB: [birth year]   PCP: [name], MD  Patient is from: Home  DOA: 03/23/2021 LOS: 50  Chief complaints:  Chief Complaint  Patient presents with   Fever   Cough   Nausea   Fatigue     Brief Narrative / Interim history: 65 year old F with PMH of RA on MTX, diastolic CHF, HTN, HLD, DM-2 and thalassemia presenting with fever, shortness of breath and dry cough that did not improve with p.o. doxycycline outpatient.  Patient was febrile but hemodynamically stable.  His Pro-Cal is low.  She completed antibiotic course for CAP coverage but continues to spike fever.  D-dimer was elevated but CTA negative for PE or any acute finding.  CRP and ESR elevated and trended up.  Full RVP panel, COVID-19, blood culture and BAL cultures negative.   TTE with ill-defined 3.23 x 2.1 cm mitral valve density raising concern for possible endocarditis. Infectious disease consulted.  Started on IV vancomycin.  Cardiology consulted and TEE concerning for large circumscribed mass adherent to the posterior annulus of MV measuring 1.8 x 1.4 cm, as well as moderate to severe mitral stenosis.  However, cardiac MRI and high-resolution which showed posterolateral mitral annulus mass consistent with degenerative mitral annular calcification.  She also had CT abdomen and pelvis that was unrevealing.  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  IV vancomycin discontinued.  Pulmonology consulted and she had a BAL which is consistent lymphocytosis suggesting MTX or feather-pillow related hypersensitivity pneumonitis.  Autoimmune labs unrevealing.  Patient was started on systemic steroid.  Methotrexate discontinued.  ID recommended Bactrim for PPx if she remains on significant dose of prednisone.  Pulmonology holding Bactrim pending G6PD result.  Patient had melanotic stool while in the hospital.  Hemoccult positive but H&H stable.  GI consulted and planning EGD +/-  colonoscopy under general anesthesia today  Subjective: Seen and examined earlier this morning.  No major events overnight of this morning.  No complaints.  Remains on room air.  Denies chest pain, shortness of breath, GI or UTI symptoms.  Denies further melena or hematochezia.  Objective: Vitals:   04/03/21 1241 04/03/21 1323 04/03/21 1330 04/03/21 1340  BP: (!) 161/83 (!) 128/57 (!) 128/56 (!) 144/64  Pulse: 70 79 77 69  Resp: 18 (!) 22 (!) 21 20  Temp: 98 F (36.7 C) 97.9 F (36.6 C)    TempSrc: Oral Oral    SpO2: 94% 93% 93% 90%  Weight: 104 kg     Height: _0  (1.626 m)       Intake/Output Summary (Last 24 hours) at 04/03/2021 1351 Last data filed at 04/03/2021 1316 Gross per 24 hour  Intake 200 ml  Output 5 ml  Net 195 ml   Filed Weights   04/02/21 0500 04/03/21 0500 04/03/21 1241  Weight: 105 kg 104.7 kg 104 kg    Examination:  GENERAL: No apparent distress.  Nontoxic. HEENT: MMM.  Vision and hearing grossly intact.  NECK: Supple.  No apparent JVD.  RESP: On RA.  No IWOB.  Fair aeration bilaterally. CVS:  RRR. Heart sounds normal.  ABD/GI/GU: BS+. Abd soft, NTND.  MSK/EXT:  Moves extremities. No apparent deformity. No edema.  SKIN: no apparent skin lesion or wound NEURO: Awake and alert. Oriented appropriately.  No apparent focal neuro deficit. PSYCH: Calm. Normal affect.   Procedures:  6/14-bronchial lavage 6/14-TEE  Microbiology summarized: COVID-19 and influenza PCR nonreactive. Full RVP panel negative. MRSA PCR screen  negative. Blood cultures negative. Urine culture with insignificant growth. AFB smear negative.  Assessment & Plan: Acute respiratory failure with hypoxia-unclear etiology despite extensive infectious and autoimmune work-up as above but concern for hypersensitivity pneumonitis related to MTX and feather pillow.  Briefly desaturated to 88% with ambulation on RA but recovered to 90% with brief rest. -Follow acid-fast culture and  pneumocystis smear -PCCM recommended long prednisone taper -Prophylactic Bactrim on hold pending G6PD  -On Protonix for GI prophylaxis. -Continue breathing treatments. -Encourage incentive spirometry/OOB -PCCM signed off.  Fever of unknown origin: Due to pneumonitis?.  Infectious and autoimmune work-up unrevealing so far.  Last fever 6/11 -Completed CAP coverage.   -Vancomycin 6/11-6/15 out of concern for endocarditis which has been ruled out.  Cardiac mass: TTE and TEE raised concern for vegetation but found to be degenerative mitral annular calcification on cardiac MRI and high-resolution CT   Moderate to severe mitral stenosis -Follow-up with cardiology outpatient.   History of nonobstructive CAD/chronic diastolic CHF: Stable.  TTE and TEE reassuring.  Appears euvolemic. -Monitor fluid status and respiratory status   Essential hypertension: BP slightly elevated. -Continue losartan and diltiazem -Increase hydralazine from 25 to 50 mg 3 times daily     IDDM-2 with hyperglycemia: Hyperglycemia likely due to steroid. Recent Labs  Lab 04/02/21 1118 04/02/21 1610 04/02/21 2129 04/03/21 0746 04/03/21 1136  GLUCAP 199* 399* 302* 100* 126*  -Continue Lantus 35 units twice daily -Continue SSI-high with night coverage. -Continue NovoLog 9 units 3 times daily with meals -Further adjustment as appropriate.   Microcytic anemia/thalassemia/upper GI bleed: H&H stable.  Hemoccult positive. Recent Labs    03/20/21 1220 03/23/21 1301 03/26/21 0402 03/27/21 0351 03/28/21 0333 03/29/21 0342 03/31/21 0431 04/01/21 0837 04/02/21 0432 04/03/21 0653  HGB 9.5* 9.3* 8.1* 8.0* 8.1* 7.6* 7.8* 8.6* 8.9* 9.1*  -Plan for EGD today -Monitor H&H. -SCD for VTE prophylaxis.   Paroxysmal atrial tachycardia/paroxysmal A. fib with RVR: RVR resolved.  Not on anticoagulation. -Continue home Cardizem CD. -No anticoagulation in the setting of positive Hemoccult/GI bleed. . At risk for sleep  apnea -Needs outpatient sleep study.  History of rheumatoid arthritis: On methotrexate at home. Concerned about pneumonitis with MTX -Continue steroid as above. -Patient to discuss alternative treatment with her rheumatologist  Debility/generalized weakness -PT/OT.  Morbid obesity Body mass index is 39.36 kg/m.  -Encourage lifestyle change to lose weight.       DVT prophylaxis:  Place and maintain sequential compression device Start: 03/30/21 1930 SCDs Start: 03/23/21 2149  Code Status: Full code Family Communication: Patient and/or RN. Available if any question.  Level of care: Telemetry Status is: Inpatient  Remains inpatient appropriate because:Ongoing diagnostic testing needed not appropriate for outpatient work up, IV treatments appropriate due to intensity of illness or inability to take PO, and Inpatient level of care appropriate due to severity of illness  Dispo: The patient is from: Home              Anticipated d/c is to: Home              Patient currently is not medically stable to d/c.   Difficult to place patient No       Consultants:  Pulmonology Infectious disease Cardiology Gastroenterology   Sch Meds:  Scheduled Meds:  [MAR Hold] arformoterol  15 mcg Nebulization BID   [MAR Hold] atorvastatin  80 mg Oral q1800   [MAR Hold] diltiazem  240 mg Oral Daily   [MAR Hold] fluticasone  1 spray  Each Nare Daily   [MAR Hold] hydrALAZINE  50 mg Oral Q8H   [MAR Hold] insulin aspart  0-20 Units Subcutaneous TID WC   [MAR Hold] insulin aspart  0-5 Units Subcutaneous QHS   [MAR Hold] insulin aspart  9 Units Subcutaneous TID WC   [MAR Hold] insulin glargine  35 Units Subcutaneous BID   [MAR Hold] loratadine  10 mg Oral Daily   [MAR Hold] losartan  100 mg Oral Daily   [MAR Hold] multivitamin with minerals  1 tablet Oral Daily   [MAR Hold] pantoprazole (PROTONIX) IV  40 mg Intravenous Q12H   [MAR Hold] predniSONE  40 mg Oral Q breakfast   Followed by   Doug Sou  Hold] predniSONE  30 mg Oral Q breakfast   Followed by   [GTX Hold] predniSONE  20 mg Oral Q breakfast   Followed by   [MIW Hold] predniSONE  10 mg Oral Q breakfast   Followed by   [OEH Hold] predniSONE  5 mg Oral Q breakfast   Followed by   [OZY Hold] predniSONE  5 mg Oral Q M,W,F   Followed by   [facility] Hold] predniSONE  5 mg Oral Q Fri   [MAR Hold] revefenacin  175 mcg Nebulization Daily   Continuous Infusions:  lactated ringers 10 mL/hr at 04/03/21 1257   lactated ringers     PRN Meds:.[MAR Hold] acetaminophen **OR** [MAR Hold] acetaminophen, [MAR Hold] albuterol, [MAR Hold] guaiFENesin-dextromethorphan, [MAR Hold] HYDROcodone bit-homatropine, [MAR Hold] ondansetron (ZOFRAN) IV  Antimicrobials: Anti-infectives (From admission, onward)    Start     Dose/Rate Route Frequency Ordered Stop   04/01/21 1000  sulfamethoxazole-trimethoprim (BACTRIM DS) 800-160 MG per tablet 1 tablet  Status:  Discontinued        1 tablet Oral Daily 03/31/21 1700 03/31/21 1741   03/30/21 1407  cefTRIAXone (ROCEPHIN) 2 g in sodium chloride 0.9 % 100 mL IVPB  Status:  Discontinued        2 g 200 mL/hr over 30 Minutes Intravenous Every 24 hours 03/30/21 0909 03/31/21 1700   03/30/21 1300  sulfamethoxazole-trimethoprim (BACTRIM DS) 800-160 MG per tablet 1 tablet  Status:  Discontinued        1 tablet Oral Every 12 hours 03/30/21 1207 03/31/21 1700   03/26/21 1600  vancomycin (VANCOREADY) IVPB 1000 mg/200 mL  Status:  Discontinued        1,000 mg 200 mL/hr over 60 Minutes Intravenous Every 24 hours 03/25/21 1514 03/29/21 1556   03/25/21 1700  cefTRIAXone (ROCEPHIN) 2 g in sodium chloride 0.9 % 100 mL IVPB  Status:  Discontinued        2 g 200 mL/hr over 30 Minutes Intravenous Every 12 hours 03/25/21 1429 03/30/21 0909   03/25/21 1600  vancomycin (VANCOCIN) 2,250 mg in sodium chloride 0.9 % 500 mL IVPB        2,250 mg 250 mL/hr over 120 Minutes Intravenous STAT 03/25/21 1503 03/25/21 1815   03/25/21 1515   cefTRIAXone (ROCEPHIN) 1 g in sodium chloride 0.9 % 100 mL IVPB  Status:  Discontinued        1 g 200 mL/hr over 30 Minutes Intravenous Every 24 hours 03/25/21 1421 03/25/21 1429   03/24/21 1530  azithromycin (ZITHROMAX) tablet 500 mg  Status:  Discontinued        500 mg Oral Daily 03/24/21 1525 03/28/21 0942   03/24/21 0145  doxycycline (VIBRA-TABS) tablet 100 mg  Status:  Discontinued       Note to  Pharmacy: Patient taking differently: Start date : 03/20/21     100 mg Oral 2 times daily 03/24/21 0131 03/24/21 1038        I have personally reviewed the following labs and images: CBC: Recent Labs  Lab 03/28/21 0333 03/29/21 0342 03/31/21 0431 04/01/21 0837 04/02/21 0432 04/03/21 0653  WBC 30.2* 25.6* 20.5* 23.0*  --  21.1*  NEUTROABS  --   --   --  16.5*  --   --   HGB 8.1* 7.6* 7.8* 8.6* 8.9* 9.1*  HCT 27.6* 26.0* 26.2* 29.0* 30.2* 30.8*  MCV 63.7* 64.5* 62.4* 62.5*  --  63.6*  PLT 412* 370 338 356  --  336   BMP &GFR Recent Labs  Lab 03/28/21 0333 03/29/21 0342 03/30/21 0341 03/31/21 0431 04/01/21 0837 04/03/21 0653  NA 133* 134*  --  136 134* 136  K 4.9 5.0 4.3 4.5 4.4 4.3  CL 99 102  --  100 97* 99  CO2 28 27  --  _0 GLUCOSE 379* 303*  --  295* 213* 102*  BUN 25* 27*  --  24* 22 27*  CREATININE 1.16* 1.17*  --  1.17* 1.18* 1.10*  CALCIUM 9.3 8.7*  --  8.9 9.1 9.3  MG  --   --   --   --  2.0 2.0  PHOS  --   --   --   --  3.9 3.9   Estimated Creatinine Clearance: 58.3 mL/min (A) (by C-G formula based on SCr of 1.1 mg/dL (H)). Liver & Pancreas: Recent Labs  Lab 03/28/21 0333 03/29/21 0342 04/01/21 0837  AST 18 21  --   ALT 22 25  --   ALKPHOS 84 68  --   BILITOT 0.2* 0.2*  --   PROT 7.6 6.9  --   ALBUMIN 3.3* 3.1* 2.9*   No results for input(s): LIPASE, AMYLASE in the last 168 hours. No results for input(s): AMMONIA in the last 168 hours. Diabetic: No results for input(s): HGBA1C in the last 72 hours. Recent Labs  Lab 04/02/21 1118  04/02/21 1610 04/02/21 2129 04/03/21 0746 04/03/21 1136  GLUCAP 199* 399* 302* 100* 126*   Cardiac Enzymes: No results for input(s): CKTOTAL, CKMB, CKMBINDEX, TROPONINI in the last 168 hours.  Recent Labs    03/20/21 1220  PROBNP 57.0   Coagulation Profile: No results for input(s): INR, PROTIME in the last 168 hours. Thyroid Function Tests: No results for input(s): TSH, T4TOTAL, FREET4, T3FREE, THYROIDAB in the last 72 hours. Lipid Profile: No results for input(s): CHOL, HDL, LDLCALC, TRIG, CHOLHDL, LDLDIRECT in the last 72 hours. Anemia Panel: No results for input(s): VITAMINB12, FOLATE, FERRITIN, TIBC, IRON, RETICCTPCT in the last 72 hours. Urine analysis:    Component Value Date/Time   COLORURINE YELLOW 03/23/2021 1732   APPEARANCEUR HAZY (A) 03/23/2021 1732   LABSPEC 1.017 03/23/2021 1732   PHURINE 5.0 03/23/2021 1732   GLUCOSEU NEGATIVE 03/23/2021 1732   HGBUR SMALL (A) 03/23/2021 1732   BILIRUBINUR NEGATIVE 03/23/2021 1732   KETONESUR NEGATIVE 03/23/2021 1732   PROTEINUR NEGATIVE 03/23/2021 1732   UROBILINOGEN 0.2 08/18/2008 1535   NITRITE NEGATIVE 03/23/2021 1732   LEUKOCYTESUR TRACE (A) 03/23/2021 1732   Sepsis Labs: Invalid input(s): PROCALCITONIN, [place]  Microbiology: Recent Results (from the past 240 hour(s))  MRSA PCR Screening     Status: None   Collection Time: 03/25/21  1:27 PM   Specimen: Nasal Mucosa; Nasopharyngeal  Result Value Ref Range Status  MRSA by PCR NEGATIVE NEGATIVE Final    Comment:        The GeneXpert MRSA Assay (FDA approved for NASAL specimens only), is one component of a comprehensive MRSA colonization surveillance program. It is not intended to diagnose MRSA infection nor to guide or monitor treatment for MRSA infections. Performed at [facility], [place] [address] [place], [place] [address]   Respiratory (~20 pathogens) panel by PCR     Status: None   Collection Time: 03/26/21  7:50 AM  Result  Value Ref Range Status   Adenovirus NOT DETECTED NOT DETECTED Final   Coronavirus 229E NOT DETECTED NOT DETECTED Final    Comment: (NOTE) The Coronavirus on the Respiratory Panel, DOES NOT test for the novel  Coronavirus (2019 nCoV)    Coronavirus HKU1 NOT DETECTED NOT DETECTED Final   Coronavirus NL63 NOT DETECTED NOT DETECTED Final   Coronavirus OC43 NOT DETECTED NOT DETECTED Final   Metapneumovirus NOT DETECTED NOT DETECTED Final   Rhinovirus / Enterovirus NOT DETECTED NOT DETECTED Final   Influenza A NOT DETECTED NOT DETECTED Final   Influenza B NOT DETECTED NOT DETECTED Final   Parainfluenza Virus 1 NOT DETECTED NOT DETECTED Final   Parainfluenza Virus 2 NOT DETECTED NOT DETECTED Final   Parainfluenza Virus 3 NOT DETECTED NOT DETECTED Final   Parainfluenza Virus 4 NOT DETECTED NOT DETECTED Final   Respiratory Syncytial Virus NOT DETECTED NOT DETECTED Final   Bordetella pertussis NOT DETECTED NOT DETECTED Final   Bordetella Parapertussis NOT DETECTED NOT DETECTED Final   Chlamydophila pneumoniae NOT DETECTED NOT DETECTED Final   Mycoplasma pneumoniae NOT DETECTED NOT DETECTED Final    Comment: Performed at [facility], [place]. [address], [place], [place] [address]  Culture, Respiratory w Gram Stain     Status: None   Collection Time: 03/28/21  2:10 PM   Specimen: Bronchoalveolar Lavage; Respiratory  Result Value Ref Range Status   Specimen Description BRONCHIAL ALVEOLAR LAVAGE  Final   Special Requests NONE  Final   Gram Stain   Final    FEW WBC PRESENT, PREDOMINANTLY MONONUCLEAR NO ORGANISMS SEEN    Culture   Final    NO GROWTH 2 DAYS Performed at [facility], [address], [place], [place] [address]    Report Status 03/31/2021 FINAL  Final  Acid Fast Smear (AFB)     Status: None   Collection Time: 03/28/21  2:10 PM   Specimen: Bronchial Alveolar Lavage  Result Value Ref Range Status   AFB Specimen Processing Concentration  Final   Acid Fast Smear  Negative  Final    Comment: (NOTE) Performed At: [facility] [place], [place] [id number] Rush Farmer MD [id number]    Source (AFB) BRONCHIAL ALVEOLAR LAVAGE  Final    Comment: RML Performed at [facility], [place] [address] [place], [place] [address]   Fungus Culture With Stain     Status: None (Preliminary result)   Collection Time: 03/28/21  2:10 PM   Specimen: Bronchial Alveolar Lavage  Result Value Ref Range Status   Fungus Stain Final report  Final    Comment: (NOTE) Performed At: [facility] [place], [place] [id number] Rush Farmer MD [id number]    Fungus (Mycology) Culture PENDING  Incomplete   Fungal Source BRONCHIAL ALVEOLAR LAVAGE  Final    Comment: RML Performed at [facility], [place] [address] [place], [place] [address]   Fungus  Culture Result     Status: None   Collection Time: 03/28/21  2:10 PM  Result Value Ref Range Status   Result 1 Comment  Final    Comment: (NOTE) KOH/Calcofluor preparation:  no fungus observed. Performed At: [facility] [place], [place] [id number] Rush Farmer MD [id number]     Radiology Studies: No results found.    Daiwik [address] T[address] [place]  If 7PM-7AM, please contact night-coverage [email or web address] 04/03/2021, 1:51 PM

## 2021-04-03 NOTE — Discharge Summary (Signed)
Physician Discharge Summary  Lynn [name] QMV:[id number] DOB: 1956/03/04 DOA: 03/23/2021  PCP: [name], MD  Admit date: 03/23/2021 Discharge date: 04/03/2021  Admitted From: Home Disposition: Home  Recommendations for Outpatient Follow-up:  Follow ups as below.  Pulmonology to arrange outpatient follow-up. Please obtain CBC/BMP/Mag at follow up Please follow up on the following pending results: G6PD, QuantiFERON gold, hypersensitivity pneumonitis labs, duodenal and gastric biopsy, acid-fast culture, SPEP and UPEP and coccidioides antibodies.  Home Health: None required Equipment/Devices: Home oxygen and bedside commode  Discharge Condition: Stable CODE STATUS: Full code   Follow-up Information     [name], PA Follow up.   Specialties: Physician Assistant, Cardiology, Radiology Why: [facility] location - follow-up has been arranged Thursday May 18, 2021 11:15 AM (Arrive by 11:00 AM). Angie is one of our PAs with Dr. Ellyn Hack. Contact information: [address] [place] [address] [phone]         [name], MD. Schedule an appointment as soon as possible for a visit in 1 week(s).   Specialty: Family Medicine Contact information: [place] [address] [place], [employer] Follow up.   Why: Your oxygen and bedside commode is from this company. We call the company [employer], please call this number if you have any problems or questions. Contact information: [address] [place] [address] [phone]                   Hospital Course: 65 year old F with PMH of RA on MTX, diastolic CHF, HTN, HLD, DM-2 and thalassemia presenting with fever, shortness of breath and dry cough that did not improve with p.o. doxycycline outpatient.  Patient was febrile but hemodynamically stable.  His Pro-Cal is low.  She completed antibiotic course for CAP coverage  but continues to spike fever.  D-dimer was elevated but CTA negative for PE or any acute finding.  CRP and ESR elevated and trended up.  Full RVP panel, COVID-19, blood culture and BAL cultures negative.   TTE with ill-defined 3.23 x 2.1 cm mitral valve density raising concern for possible endocarditis. Infectious disease consulted.  Started on IV vancomycin.  Cardiology consulted and TEE concerning for large circumscribed mass adherent to the posterior annulus of MV measuring 1.8 x 1.4 cm, as well as moderate to severe mitral stenosis.  However, cardiac MRI and high-resolution which showed posterolateral mitral annulus mass consistent with degenerative mitral annular calcification.  She also had CT abdomen and pelvis that was unrevealing.  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  IV vancomycin discontinued.  Pulmonology consulted and she had a BAL which is consistent lymphocytosis suggesting MTX or feather-pillow related hypersensitivity pneumonitis.  Autoimmune labs unrevealing.  Patient was started on systemic steroid.  Methotrexate discontinued.  ID recommended Bactrim for PPx if she remains on significant dose of prednisone.    Patient's respiratory failure improved.  She maintained appropriate saturation on room air but desaturated to 87% with ambulation requiring 2 L to recover.  She was cleared for discharge by pulmonology on extended steroid taper as below.  Patient to to call her endocrinologist for close follow-up on her diabetic management while on steroid.  She uses insulin pump.  She says she can get a hold of the endocrinologist whenever she wants.  Patient will be on Bactrim DS 1 tablet daily for 3 weeks for prophylaxis while on significant dose of  prednisone.  HCTZ discontinued out of concern for hyponatremia with Bactrim.    Patient had melanotic stool while in the hospital.  Hemoccult positive but H&H remained stable after starting PPI.  GI consulted and she underwent EGD that  showed nonbleeding esophageal erosion/ulcer and 2 nonbleeding small gastric ulcers.  Biopsies obtained.  GI recommended discharge on p.o. Protonix twice daily.   See individual problem list below for more on hospital course.  Discharge Diagnoses:  Acute respiratory failure with hypoxia-unclear etiology despite extensive infectious and autoimmune work-up as above but concern for hypersensitivity pneumonitis related to MTX and feather pillow.  Improved. -Cleared for discharge by pulmonology -Pulmonology to follow-up on G6PD, QuantiFERON gold, hypersensitivity pneumonitis labs, acid-fast culture, SPEP and UPEP and coccidioides antibodies. -Extended prednisone taper per recommendation by pulmonology -Prophylactic Bactrim DS daily per recommendation by ID -On Protonix for GI prophylaxis. -Anoro Ellipta, Flonase and as needed albuterol -Encourage incentive spirometry -2 L oxygen by nasal cannula with ambulation/activity.   Fever of unknown origin: Due to pneumonitis?.  Infectious and autoimmune work-up unrevealing so far.  Last fever 6/11 -Completed CAP coverage.   -Vancomycin 6/11-6/15 out of concern for endocarditis which has been ruled out.   Cardiac mass: TTE and TEE raised concern for vegetation but found to be degenerative mitral annular calcification on cardiac MRI and high-resolution CT   Moderate to severe mitral stenosis -Follow-up with cardiology outpatient.   History of nonobstructive CAD/chronic diastolic CHF: Stable.  TTE and TEE reassuring.  Appears euvolemic.  Continue home medications   Essential hypertension: BP slightly elevated. -Continue losartan, Cardizem and Imdur -Discontinued home HCTZ while on Bactrim.     IDDM-2 with hyperglycemia: Hyperglycemia likely due to steroid.  Last CBG was postprandial. Recent Labs  Lab 04/02/21 2129 04/03/21 0746 04/03/21 1136 04/03/21 1404 04/03/21 1613  GLUCAP 302* 100* 126* 139* 328*  -Patient to resume insulin pump and  contact her endocrinologist -Patient to continue home Victoza and statin.    Microcytic anemia/thalassemia/upper GI bleed: H&H stable.  Hemoccult positive. Recent Labs    03/20/21 1220 03/23/21 1301 03/26/21 0402 03/27/21 0351 03/28/21 0333 03/29/21 0342 03/31/21 0431 04/01/21 0837 04/02/21 0432 04/03/21 0653  HGB 9.5* 9.3* 8.1* 8.0* 8.1* 7.6* 7.8* 8.6* 8.9* 9.1*  -EGD with nonbleeding esophageal erosion/ulcer and gastric ulcers. -P.o. Protonix 40 mg twice daily -Recheck CBC in 1 week   Paroxysmal atrial tachycardia/paroxysmal A. fib with RVR: RVR resolved.  Not on anticoagulation. -Continue home Cardizem CD. -No anticoagulation in the setting of positive Hemoccult/GI bleed. -Patient to discuss anticoagulation with a cardiologist . At risk for sleep apnea -Needs outpatient sleep study.   History of rheumatoid arthritis: On methotrexate at home. Concerned about pneumonitis with MTX -Continue steroid as above. -Patient to discuss alternative treatment with her rheumatologist   Debility/generalized weakness -Oxygen and bedside commode ordered on discharge.  No other therapy needed identified.   Morbid obesity Body mass index is 39.36 kg/m.  -Patient is already on Victoza. -Encourage lifestyle change to lose weight          Discharge Exam: Vitals:   04/03/21 1340 04/03/21 1345  BP: (!) 144/64 (!) 150/65  Pulse: 69 67  Resp: 20 (!) 24  Temp:    SpO2: 90% 91%    GENERAL: No apparent distress.  Nontoxic. HEENT: MMM.  Vision and hearing grossly intact.  NECK: Supple.  No apparent JVD.  RESP:  No IWOB.  Fair aeration bilaterally. CVS:  RRR. Heart sounds normal.  ABD/GI/GU: Bowel sounds  present. Soft. Non tender.  MSK/EXT:  Moves extremities. No apparent deformity. No edema.  SKIN: no apparent skin lesion or wound NEURO: Awake, alert and oriented appropriately.  No apparent focal neuro deficit. PSYCH: Calm. Normal affect.   Discharge  Instructions  Discharge Instructions     Call MD for:  difficulty breathing, headache or visual disturbances   Complete by: As directed    Call MD for:  extreme fatigue   Complete by: As directed    Call MD for:  persistant dizziness or light-headedness   Complete by: As directed    Call MD for:  severe uncontrolled pain   Complete by: As directed    Call MD for:  temperature >100.4   Complete by: As directed    Diet - low sodium heart healthy   Complete by: As directed    Diet Carb Modified   Complete by: As directed    Discharge instructions   Complete by: As directed    It has been a pleasure taking care of you!  You were hospitalized due to shortness of breath likely from lung inflammation due to methotrexate and feather pillows.  You have been treated with steroid to help with the lung inflammation.  Your breathing improved.  You are discharged on prednisone (steroid) for further treatment of the inflammation.  Steroid would increase your risk of infection and bleeding from stomach or heartburn.  To prevent the side effects, we are discharging you on antibiotics and Protonix (antiacid).  Steroid could also increase your blood sugar.  Please closely monitor your blood sugar and discussed with your endocrinologist.  Please review your new medication list and the directions on your medications before you take them.  Lung doctors will arrange outpatient follow-up.  We also recommend outpatient follow-up with cardiology about your heart/atrial fibrillation.   Take care,   Increase activity slowly   Complete by: As directed       Allergies as of 04/03/2021       Reactions   Penicillins Hives   Childhood allergy Has patient had a PCN reaction causing immediate rash, facial/tongue/throat swelling, SOB or lightheadedness with hypotension: Yes Has patient had a PCN reaction causing severe rash involving mucus membranes or skin necrosis: No Has patient had a PCN reaction that required  hospitalization No Has patient had a PCN reaction occurring within the last 10 years: No If all of the above answers are "NO", then may proceed with Cephalosporin use.   Farxiga [dapagliflozin] Other (See Comments)   Dizzy and lethargic   Metformin And Related    Diarrhea, bleeding        Medication List     STOP taking these medications    doxycycline 100 MG capsule Commonly known as: VIBRAMYCIN   hydrochlorothiazide 12.5 MG tablet Commonly known as: HYDRODIURIL   methotrexate 50 MG/2ML injection   Shingrix injection Generic drug: Zoster Vaccine Adjuvanted       TAKE these medications    acetaminophen 500 MG tablet Commonly known as: TYLENOL Take 500 mg by mouth every 6 (six) hours as needed for fever.   albuterol 108 (90 Base) MCG/ACT inhaler Commonly known as: VENTOLIN HFA Inhale 2 puffs into the lungs every 6 (six) hours as needed for wheezing or shortness of breath.   Anoro Ellipta 62.5-25 MCG/INH Aepb Generic drug: umeclidinium-vilanterol Inhale 1 puff into the lungs daily. Notes to patient: 04/04/2021    atorvastatin 80 MG tablet Commonly known as: LIPITOR TAKE 1 TABLET(80 MG) BY  MOUTH DAILY AT 6 PM What changed:  how much to take how to take this when to take this additional instructions Notes to patient: 04/03/2021 at supper meal    B-D UF III MINI PEN NEEDLES 31G X 5 MM Misc Generic drug: Insulin Pen Needle USE DAILY WITH VICTOZA AND LANTUS.   Insulin Pen Needle 31G X 5 MM Misc 1 Device by Does not apply route 3 (three) times daily.   cetirizine 10 MG tablet Commonly known as: ZYRTEC Take 10 mg by mouth daily as needed for allergies. Notes to patient: 04/04/2021    Contour Next Test test strip Generic drug: glucose blood 3x daily   Dexcom G6 Sensor Misc 1 Device by Does not apply route as directed.   Dexcom G6 Transmitter Misc 1 Device by Does not apply route as directed.   diltiazem 240 MG 24 hr capsule Commonly known as: CARDIZEM  CD Take 1 capsule (240 mg total) by mouth daily. Notes to patient: 04/04/2021    Flovent Diskus 50 MCG/BLIST diskus inhaler Generic drug: fluticasone Inhale 1 puff into the lungs 2 (two) times daily. Notes to patient: 6/59/9357 evening    folic acid 1 MG tablet Commonly known as: FOLVITE Take 2 tablets (2 mg total) by mouth daily. What changed:  how much to take when to take this Notes to patient: 04/04/2021    insulin lispro 100 UNIT/ML injection Commonly known as: HUMALOG Inject 8 Units into the skin See admin instructions. Back up for pump.   Lancets 30G Misc 1 Device by Does not apply route 3 (three) times daily.   losartan 100 MG tablet Commonly known as: COZAAR Take 1 tablet (100 mg total) by mouth daily. Notes to patient: 04/04/2021    multivitamin with minerals Tabs tablet Take 1 tablet by mouth daily. Notes to patient: 04/04/2021    Omnipod DASH Pods (Gen 4) Misc Inject 1 Dose into the skin continuous. Using pump - depends on carb intake.   pantoprazole 40 MG tablet Commonly known as: Protonix Take 1 tablet (40 mg total) by mouth 2 (two) times daily. Notes to patient: 04/03/2021 evening dose    predniSONE 10 MG tablet Commonly known as: DELTASONE Take 4 tablets (40 mg total) by mouth daily with breakfast for 7 days, THEN 3 tablets (30 mg total) daily with breakfast for 7 days, THEN 2 tablets (20 mg total) daily with breakfast for 7 days, THEN 1 tablet (10 mg total) daily with breakfast for 7 days, THEN 0.5 tablets (5 mg total) daily with breakfast for 7 days, THEN 0.5 tablets (5 mg total) every Monday, Wednesday, and Friday for 14 days. Start taking on: April 04, 2021   SAFETY-LOK TB SYRINGE 27GX.5" 27G X 1/2" 1 ML Misc Generic drug: TUBERCULIN SYR 1CC/27GX1/2"   sulfamethoxazole-trimethoprim 800-160 MG tablet Commonly known as: BACTRIM DS Take 1 tablet by mouth daily. Notes to patient: 04/04/2021    Victoza 18 MG/3ML Sopn Generic drug: liraglutide INJECT 1.8  MG UNDER THE SKIN ONCE DAILY What changed: See the new instructions.       ASK your doctor about these medications    isosorbide mononitrate 30 MG 24 hr tablet Commonly known as: IMDUR Take 30 mg  tablet as needed for Chest pain spasm episodes for 3 days and the stop               Durable Medical Equipment  (From admission, onward)           Start  Ordered   04/03/21 1528  For home use only DME oxygen  Once       Question Answer Comment  Length of Need 6 Months   Mode or (Route) Nasal cannula   Liters per Minute 2   Frequency Continuous (stationary and portable oxygen unit needed)   Oxygen delivery system Gas      04/03/21 1527   04/03/21 1445  For home use only DME Bedside commode  Once       Question:  Patient needs a bedside commode to treat with the following condition  Answer:  Generalized weakness   04/03/21 1444            Consultations: Pulmonology Cardiology Gastroenterology  Procedures/Studies: 6/14-bronchial lavage 6/20-EGD under anesthesia - Esophagogastric landmarks identified. - 1 cm hiatal hernia. - Esophageal erosion / ulcer with no bleeding and no stigmata of recent bleeding. - 2 Non-bleeding small gastric ulcers with no stigmata of bleeding. Biopsied. - Normal stomach otherwise - no heme noted anywhere. Biopsies taken to rule out H pylori - Normal duodenal bulb and second portion of the duodenum. Suspect symptoms likely due   CT Abdomen Pelvis Wo Contrast  Result Date: 03/23/2021 CLINICAL DATA:  Cough, nausea for 3 weeks.  Abdomen pain and fever. EXAM: CT ABDOMEN AND PELVIS WITHOUT CONTRAST TECHNIQUE: Multidetector CT imaging of the abdomen and pelvis was performed following the standard protocol without IV contrast. COMPARISON:  None. FINDINGS: Lower chest: No acute abnormality. Dense calcification of mitral valvular ring is identified. Hepatobiliary: There is mild diffuse low density of the liver. No focal lesion is identified in  the liver. The gallbladder and biliary tree are normal. Pancreas: Unremarkable. No pancreatic ductal dilatation or surrounding inflammatory changes. Spleen: Normal in size without focal abnormality. Adrenals/Urinary Tract: Adrenal glands are unremarkable. Kidneys are normal, without renal calculi, focal lesion, or hydronephrosis. Bladder is unremarkable. Stomach/Bowel: Stomach is within normal limits. The appendix is not definitely seen but no inflammation is noted around cecum. No evidence of bowel wall thickening, distention, or inflammatory changes. There is diverticulosis of colon. Vascular/Lymphatic: Aortic atherosclerosis. No enlarged abdominal or pelvic lymph nodes. Reproductive: Status post hysterectomy. No adnexal masses. Other: Small umbilical herniation of mesenteric fat is noted. Musculoskeletal: Degenerative joint changes of the spine are identified. IMPRESSION: 1. No acute abnormality identified in the abdomen and pelvis. 2. Fatty infiltration of liver. 3. Diverticulosis of colon without diverticulitis. 4. Aortic atherosclerosis. Aortic Atherosclerosis (ICD10-I70.0). Electronically Signed   By: Abelardo Diesel M.D.   On: 03/23/2021 18:47   DG Chest 2 View  Result Date: 03/25/2021 CLINICAL DATA:  Fever and cough. EXAM: CHEST - 2 VIEW COMPARISON:  Chest x-ray 03/23/2021 and chest CT 03/21/2021 FINDINGS: Borderline heart size is stable. The mediastinal and hilar contours are stable. Persistent patchy nodular interstitial process in the lungs without discrete focal pneumonia. No pleural effusion or pneumothorax. IMPRESSION: Persistent patchy nodular interstitial process in the lungs. Findings could be due to atypical/viral pneumonia, reactive airways disease, hypersensitivity pneumonitis or cryptogenic organizing pneumonia. Electronically Signed   By: Marijo Sanes M.D.   On: 03/25/2021 14:14   DG Chest 2 View  Result Date: 03/23/2021 CLINICAL DATA:  Cough, shortness of breath for several weeks. EXAM:  CHEST - 2 VIEW COMPARISON:  March 17, 2021 FINDINGS: The heart size and mediastinal contours are stable. Mitral annular calcification is unchanged. Both lungs are clear. The visualized skeletal structures are unremarkable. IMPRESSION: No active cardiopulmonary disease. Electronically Signed   By: Abelardo Diesel  M.D.   On: 03/23/2021 14:16   DG Chest 2 View  Result Date: 03/17/2021 CLINICAL DATA:  Shortness of breath and cough EXAM: CHEST - 2 VIEW COMPARISON:  December 02, 2017 chest radiograph and chest CT August 13, 2017 FINDINGS: Interstitium is mildly thickened. No edema or airspace opacity. Heart is upper normal in size with pulmonary vascularity, stable. No adenopathy. There is aortic atherosclerosis. No bone lesions. Calcification noted in the region of the mitral valve/mitral annulus, unchanged. IMPRESSION: Interstitial thickening, likely indicative of a degree of underlying chronic bronchitis. No edema or airspace opacity. Heart upper normal in size. Stable calcification in the region of the mitral valve/mitral annulus. Aortic Atherosclerosis (ICD10-I70.0). Electronically Signed   By: Lowella Grip III M.D.   On: 03/17/2021 13:40   CT Angio Chest W/Cm &/Or Wo Cm  Result Date: 03/21/2021 CLINICAL DATA:  Elevated D-dimer.  Cough EXAM: CT ANGIOGRAPHY CHEST WITH CONTRAST TECHNIQUE: Multidetector CT imaging of the chest was performed using the standard protocol during bolus administration of intravenous contrast. Multiplanar CT image reconstructions and MIPs were obtained to evaluate the vascular anatomy. CONTRAST:  48m OMNIPAQUE IOHEXOL 350 MG/ML SOLN COMPARISON:  08/13/2017 FINDINGS: Cardiovascular: No filling defects in the pulmonary arteries to suggest pulmonary emboli. Heart is mildly enlarged. Densely calcified mitral valve annulus and coronary arteries. Scattered aortic calcifications. No aneurysm. Mediastinum/Nodes: Shotty mediastinal lymph nodes, none pathologically enlarged or change since  prior study. No mediastinal, hilar, or axillary adenopathy. Trachea and esophagus are unremarkable. Thyroid unremarkable. Lungs/Pleura: Scattered ground-glass opacities in the lungs, favor atelectasis or areas of air trapping. No confluent opacities or effusions. Upper Abdomen: Imaging into the upper abdomen demonstrates no acute findings. Musculoskeletal: Chest wall soft tissues are unremarkable. No acute bony abnormality. Review of the MIP images confirms the above findings. IMPRESSION: No evidence of pulmonary embolus. Cardiomegaly, coronary artery disease. Scattered areas of ground-glass opacity, likely atelectasis. Aortic Atherosclerosis (ICD10-I70.0). Electronically Signed   By: KRolm BaptiseM.D.   On: 03/21/2021 11:29   MR BRAIN W WO CONTRAST  Result Date: 03/26/2021 CLINICAL DATA:  Respiratory failure, possible endocarditis EXAM: MRI HEAD WITHOUT AND WITH CONTRAST TECHNIQUE: Multiplanar, multiecho pulse sequences of the brain and surrounding structures were obtained without and with intravenous contrast. CONTRAST:  112mGADAVIST GADOBUTROL 1 MMOL/ML IV SOLN COMPARISON:  Correlation made with CT from 2018 FINDINGS: Brain: There is no acute infarction or intracranial hemorrhage. There is no intracranial mass, mass effect, or edema. Chronic left occipital volume loss with corresponding susceptibility reflecting mineralization noted on prior CT. Cortical enhancement in this region is noted along with increased cortical veins. There is relative prominence of the left basal vein and internal cerebral vein. There is no additional abnormal enhancement. There is no hydrocephalus or extra-axial fluid collection. Ventricles and sulci are normal in size and configuration. Vascular: Major vessel flow voids at the skull base are preserved. Skull and upper cervical spine: Normal marrow signal is preserved. Sinuses/Orbits: Paranasal sinuses are aerated. Orbits are unremarkable. Other: Sella is partially empty. Minor  patchy mastoid fluid opacification. IMPRESSION: No evidence of septic emboli are other acute abnormality. Chronic left parietal volume loss with gyral calcification. Increased cortical veins in this region and relative prominence of left deep cerebral veins raise possibility of a dural arteriovenous fistula. Electronically Signed   By: PrMacy Mis.D.   On: 03/26/2021 18:21   CT Chest High Resolution  Result Date: 03/30/2021 CLINICAL DATA:  Interstitial lung disease, lymphocytosis on bronchoalveolar lavage. Acute hypoxic respiratory failure.  EXAM: CT CHEST WITHOUT CONTRAST TECHNIQUE: Multidetector CT imaging of the chest was performed following the standard protocol without intravenous contrast. High resolution imaging of the lungs, as well as inspiratory and expiratory imaging, was performed. COMPARISON:  03/21/2021. FINDINGS: Cardiovascular: Atherosclerotic calcification of the aorta, aortic valve and coronary arteries. Pulmonic trunk and heart are enlarged. No pericardial effusion. Mediastinum/Nodes: Numerous mediastinal lymph nodes, none of which are enlarged by CT size criteria. Hilar regions are difficult to evaluate without IV contrast. No axillary adenopathy. Esophagus is grossly unremarkable. Lungs/Pleura: Interval development of worsening peribronchovascular ground-glass, interstitial thickening and scattered collapse/consolidation, upper and midlung zone predominant. No definite air trapping. There may be trace right pleural fluid. Airway is unremarkable. Upper Abdomen: Visualized portions of the liver, adrenal glands, kidneys, spleen, pancreas, stomach and bowel are grossly unremarkable. Upper abdominal lymph nodes are not enlarged by CT size criteria. Musculoskeletal: Degenerative changes in the spine. IMPRESSION: 1. Marked increase in upper/midlung zone predominant peribronchovascular ground-glass, interstitial thickening and scattered collapse/consolidation when compared with 03/21/2021.  Findings are likely due to an atypical/viral pneumonia, including due to COVID-19. Acute drug toxicity is not excluded. Findings are suggestive of an alternative diagnosis (not UIP) per consensus guidelines: Diagnosis of Idiopathic Pulmonary Fibrosis: An Official ATS/ERS/JRS/ALAT Clinical Practice Guideline. [place], Iss 5, [phone], Jun 15 2017. 2. Trace right pleural fluid. 3. Aortic atherosclerosis (ICD10-I70.0). Coronary artery calcification. 4. Enlarged pulmonic trunk, indicative of pulmonary arterial hypertension. Electronically Signed   By: Lorin Picket M.D.   On: 03/30/2021 14:14   DG CHEST PORT 1 VIEW  Result Date: 03/26/2021 CLINICAL DATA:  Increasing shortness of breath. EXAM: PORTABLE CHEST 1 VIEW COMPARISON:  03/25/2021, 03/23/2021 and CT 03/21/2021 FINDINGS: Lungs are adequately inflated with persistent hazy bilateral interstitium which may be due to mild interstitial edema versus atypical infection. No effusion. Cardiomediastinal silhouette is unchanged. Focal calcification over the mitral valve annulus unchanged. Remainder of the exam is unchanged. IMPRESSION: Persistent hazy bilateral interstitium which may be due to interstitial edema versus atypical infection. Electronically Signed   By: Marin Olp M.D.   On: 03/26/2021 14:46   MR CARDIAC MORPHOLOGY W WO CONTRAST  Result Date: 03/30/2021 CLINICAL DATA:  Cardiac mass EXAM: CARDIAC MRI TECHNIQUE: The patient was scanned on a 1.5 Tesla GE magnet. A dedicated cardiac coil was used. Functional imaging was done using Fiesta sequences. 2,3, and 4 chamber views were done to assess for RWMA's. Modified Simpson's rule using a short axis stack was used to calculate an ejection fraction on a dedicated work Conservation officer, nature. The patient received 10 cc of Gadavist. After 10 minutes inversion recovery sequences were used to assess for infiltration and scar tissue. CONTRAST:  10 cc  of Gadavist FINDINGS: 1.  Normal left ventricular size, thickness and systolic function (LVEF = 53%). There are no regional wall motion abnormalities. There is no late gadolinium enhancement in the left ventricular myocardium. There is a 21 x 19 mm mass at the level of the mitral annulus, posterior lateral segment. The mass was hypointense on T1, T2-weighted and short tau inversion recovery sequences. The mass did not show any contrast uptake or late gadolinium enhancement highly suggestive of degenerative mitral annular calcification. LVEDV: 146 ml LVESV: 68 ml SV: 77 ml CO: 5L/min Myocardial mass: 113g 2. Normal right ventricular size, thickness and systolic function (RVEF = 48%). There are no regional wall motion abnormalities. 3.  Normal left and right atrial size. 4. Normal size of  the aortic root, ascending aorta and pulmonary artery. 5.  Mild mitral regurgitation. 6.  Normal pericardium.  No pericardial effusion. IMPRESSION: 1.  Normal right and left ventricular size and function, LVEF 53%. 2. Posterolateral mitral annulus mass consistent with degenerative mitral annular calcification (hypointense on T1, T2 and STIR sequences, no LGE). 3. I reviewed today's High resolution chest CT which confirmed calcification of mitral annular mass as above. 4.  No scar or late gadolinium enhancement noted on LV myocardium. Kate Sable Electronically Signed   By: Kate Sable M.D.   On: 03/30/2021 16:09   ECHOCARDIOGRAM COMPLETE  Result Date: 03/24/2021    ECHOCARDIOGRAM REPORT   Patient Name:   Lynn [name] Date of Exam: 03/24/2021 Medical Rec #:  [id number]               Height:       64.0 in Accession #:    [id number]              Weight:       233.0 lb Date of Birth:  05-23-1956                BSA:          2.088 m Patient Age:    12 years                BP:           155/70 mmHg Patient Gender: F                       HR:           80 bpm. Exam Location:  Inpatient Procedure: 2D Echo, Cardiac Doppler and Color Doppler  Indications:    Fever R50.9  History:        Patient has prior history of Echocardiogram examinations, most                 recent 02/09/2020. CAD and Angina, Signs/Symptoms:Murmur and                 Syncope; Risk Factors:Hypertension, Diabetes, Dyslipidemia and                 Sleep Apnea.  Sonographer:    Jonelle Sidle Dance Referring Phys: [id number] Rhetta Mura  Sonographer Comments: Reading Cardiologist notified. IMPRESSIONS  1. Left ventricular ejection fraction, by estimation, is 60 to 65%. The left ventricle has normal function. The left ventricle has no regional wall motion abnormalities. There is moderate left ventricular hypertrophy of the basal-septal segment. Left ventricular diastolic parameters are consistent with Grade II diastolic dysfunction (pseudonormalization). Elevated left ventricular end-diastolic pressure.  2. Right ventricular systolic function is normal. The right ventricular size is normal. There is mildly elevated pulmonary artery systolic pressure. The estimated right ventricular systolic pressure is 10.2 mmHg.  3. Left atrial size was mildly dilated.  4. The mitral valve is abnormal. There is an ill defined density measuring 3.23 x 2.10cm that appears to originate off the posterior MV leaflet but cannot be definite on this. There is also mitral annular calcification present. There is moderate mitral stenosis with a mean MVG of 87mHg. No evidence of mitral valve regurgitation.  5. The aortic valve is normal in structure. Aortic valve regurgitation is not visualized. No aortic stenosis is present.  6. The inferior vena cava is normal in size with greater than 50% respiratory variability, suggesting right atrial pressure of 3 mmHg.  7. Recommend  TEE for possible mitral valve endocarditis in setting of abnormal mitral valve and fevers. FINDINGS  Left Ventricle: Left ventricular ejection fraction, by estimation, is 60 to 65%. The left ventricle has normal function. The left ventricle has no  regional wall motion abnormalities. The left ventricular internal cavity size was normal in size. There is  moderate left ventricular hypertrophy of the basal-septal segment. Left ventricular diastolic parameters are consistent with Grade II diastolic dysfunction (pseudonormalization). Elevated left ventricular end-diastolic pressure. Right Ventricle: The right ventricular size is normal. No increase in right ventricular wall thickness. Right ventricular systolic function is normal. There is mildly elevated pulmonary artery systolic pressure. The tricuspid regurgitant velocity is 2.93  m/s, and with an assumed right atrial pressure of 3 mmHg, the estimated right ventricular systolic pressure is 45.9 mmHg. Left Atrium: Left atrial size was mildly dilated. Right Atrium: Right atrial size was normal in size. Pericardium: There is no evidence of pericardial effusion. Mitral Valve: There is an ill defined density measuring 3.23 x 2.10cm that appears to originate off the posterior MV leaflet but cannot be definite on this. There is also mitral annular calcification present. There is moderate mitral stenosis with a mean  MVG of 56mHg. The mitral valve is abnormal. Severe mitral annular calcification. No evidence of mitral valve regurgitation. Moderate mitral valve stenosis. MV peak gradient, 18.8 mmHg. The mean mitral valve gradient is 9.0 mmHg. Tricuspid Valve: The tricuspid valve is normal in structure. Tricuspid valve regurgitation is mild . No evidence of tricuspid stenosis. Aortic Valve: The aortic valve is normal in structure. Aortic valve regurgitation is not visualized. No aortic stenosis is present. Pulmonic Valve: The pulmonic valve was normal in structure. Pulmonic valve regurgitation is not visualized. No evidence of pulmonic stenosis. Aorta: The aortic root is normal in size and structure. Venous: The inferior vena cava is normal in size with greater than 50% respiratory variability, suggesting right atrial  pressure of 3 mmHg. IAS/Shunts: No atrial level shunt detected by color flow Doppler.  LEFT VENTRICLE PLAX 2D LVIDd:         3.00 cm  Diastology LVIDs:         1.70 cm  LV e' medial:    6.42 cm/s LV PW:         2.10 cm  LV E/e' medial:  25.9 LV IVS:        1.40 cm  LV e' lateral:   7.51 cm/s LVOT diam:     1.90 cm  LV E/e' lateral: 22.1 LV SV:         59 LV SV Index:   28 LVOT Area:     2.84 cm  RIGHT VENTRICLE             IVC RV Basal diam:  2.80 cm     IVC diam: 1.70 cm RV S prime:     19.60 cm/s TAPSE (M-mode): 2.1 cm LEFT ATRIUM             Index       RIGHT ATRIUM          Index LA diam:        4.20 cm 2.01 cm/m  RA Area:     9.78 cm LA Vol (A2C):   82.4 ml 39.47 ml/m RA Volume:   20.10 ml 9.63 ml/m LA Vol (A4C):   71.0 ml 34.01 ml/m LA Biplane Vol: 76.5 ml 36.65 ml/m  AORTIC VALVE LVOT Vmax:   116.00 cm/s LVOT Vmean:  75.000 cm/s LVOT VTI:    0.207 m  AORTA Ao Root diam: 3.20 cm Ao Asc diam:  3.10 cm MITRAL VALVE                TRICUSPID VALVE MV Area (PHT): 2.76 cm     TR Peak grad:   34.3 mmHg MV Area VTI:   0.89 cm     TR Vmax:        293.00 cm/s MV Peak grad:  18.8 mmHg MV Mean grad:  9.0 mmHg     SHUNTS MV Vmax:       2.17 m/s     Systemic VTI:  0.21 m MV Vmean:      147.0 cm/s   Systemic Diam: 1.90 cm MV Decel Time: 275 msec MV E velocity: 166.00 cm/s MV A velocity: 195.00 cm/s MV E/A ratio:  0.85 Fransico Him MD Electronically signed by Fransico Him MD Signature Date/Time: 03/24/2021/12:59:52 PM    Final    ECHO TEE  Result Date: 03/28/2021    TRANSESOPHOGEAL ECHO REPORT   Patient Name:   ANITTA TENNY Date of Exam: 03/28/2021 Medical Rec #:  [id number]               Height:       64.0 in Accession #:    [id number]              Weight:       238.8 lb Date of Birth:  1956/07/28                BSA:          2.109 m Patient Age:    65 years                BP:           118/55 mmHg Patient Gender: F                       HR:           94 bpm. Exam Location:  Inpatient Procedure: 3D Echo,  Transesophageal Echo, Cardiac Doppler and Color Doppler Indications:     I34.8 Other nonrheumatic mitral valve disorders  History:         Patient has prior history of Echocardiogram examinations, most                  recent 03/24/2021. CAD, Signs/Symptoms:Murmur and Syncope; Risk                  Factors:Hypertension, Diabetes, Dyslipidemia and Sleep Apnea.  Sonographer:     Jonelle Sidle Dance Referring Phys:  [id number] Diagnosing Phys: Mertie Moores MD PROCEDURE: The transesophogeal probe was passed without difficulty through the esophogus of the patient. Sedation performed by different physician. The patient was monitored while under deep sedation. Anesthestetic sedation was provided intravenously by Anesthesiology: 147m of Propofol, 658mof Lidocaine. Patients was under conscious sedation during this procedure. Anesthetic administered: 5019mof Fentanyl. The patient developed no complications during the procedure. IMPRESSIONS  1. Left ventricular ejection fraction, by estimation, is 55 to 60%. The left ventricle has normal function. The left ventricle has no regional wall motion abnormalities.  2. Right ventricular systolic function was not well visualized. The right ventricular size is not well visualized.  3. No left atrial/left atrial appendage thrombus was detected.  4. 3-D images of the mitral annular mass were obtained. There is a large circumscribed  mass adherent to the posterior annulus. The posterior leaflet is fixed . There is moderate - severe mitral stenosis.     The mass is large , measuring 1.8 x 1.4 cm. Differential diagnosis includes myxoma, exuberant mitral annular calcification, myxoma, or other primary cardiac tumor. suggest Cardiac MRI for further evaluation . This mass was present on transthoracic echo from 2018. . The mitral valve is abnormal. Mild mitral valve regurgitation. Moderate to severe mitral stenosis. Severe mitral annular calcification.  5. The aortic valve is tricuspid.  Aortic valve regurgitation is not visualized. No aortic stenosis is present.  6. There is mild (Grade II) plaque. FINDINGS  Left Ventricle: Left ventricular ejection fraction, by estimation, is 55 to 60%. The left ventricle has normal function. The left ventricle has no regional wall motion abnormalities. The left ventricular internal cavity size was normal in size. Right Ventricle: The right ventricular size is not well visualized. Right vetricular wall thickness was not well visualized. Right ventricular systolic function was not well visualized. Left Atrium: Left atrial size was normal in size. No left atrial/left atrial appendage thrombus was detected. Right Atrium: Right atrial size was normal in size. Pericardium: There is no evidence of pericardial effusion. Mitral Valve: 3-D images of the mitral annular mass were obtained. There is a large circumscribed mass adherent to the posterior annulus. The posterior leaflet is fixed . There is moderate - severe mitral stenosis. The mass is large , measuring 1.8 x 1.4 cm. Differential diagnosis includes myxoma, exuberant mitral annular calcification, myxoma, or other primary cardiac tumor. suggest Cardiac MRI for further evaluation . This mass was present on transthoracic echo from 2018. The mitral valve is abnormal. Severe mitral annular calcification. Mild mitral valve regurgitation. Moderate to severe mitral valve stenosis. MV peak gradient, 19.9 mmHg. The mean mitral valve gradient is 14.0 mmHg. Tricuspid Valve: The tricuspid valve is grossly normal. Tricuspid valve regurgitation is mild. Aortic Valve: The aortic valve is tricuspid. Aortic valve regurgitation is not visualized. No aortic stenosis is present. Pulmonic Valve: The pulmonic valve was grossly normal. Pulmonic valve regurgitation is not visualized. Aorta: The aortic root and ascending aorta are structurally normal, with no evidence of dilitation. There is mild (Grade II) plaque. IAS/Shunts: The atrial  septum is grossly normal.  MITRAL VALVE MV Peak grad: 19.9 mmHg MV Mean grad: 14.0 mmHg MV Vmax:      2.23 m/s MV Vmean:     178.0 cm/s Mertie Moores MD Electronically signed by Mertie Moores MD Signature Date/Time: 03/28/2021/4:14:10 PM    Final        The results of significant diagnostics from this hospitalization (including imaging, microbiology, ancillary and laboratory) are listed below for reference.     Microbiology: Recent Results (from the past 240 hour(s))  MRSA PCR Screening     Status: None   Collection Time: 03/25/21  1:27 PM   Specimen: Nasal Mucosa; Nasopharyngeal  Result Value Ref Range Status   MRSA by PCR NEGATIVE NEGATIVE Final    Comment:        The GeneXpert MRSA Assay (FDA approved for NASAL specimens only), is one component of a comprehensive MRSA colonization surveillance program. It is not intended to diagnose MRSA infection nor to guide or monitor treatment for MRSA infections. Performed at [facility], [place] [address] [place], [place] [address]   Respiratory (~20 pathogens) panel by PCR     Status: None   Collection Time: 03/26/21  7:50 AM  Result Value Ref Range  Status   Adenovirus NOT DETECTED NOT DETECTED Final   Coronavirus 229E NOT DETECTED NOT DETECTED Final    Comment: (NOTE) The Coronavirus on the Respiratory Panel, DOES NOT test for the novel  Coronavirus (2019 nCoV)    Coronavirus HKU1 NOT DETECTED NOT DETECTED Final   Coronavirus NL63 NOT DETECTED NOT DETECTED Final   Coronavirus OC43 NOT DETECTED NOT DETECTED Final   Metapneumovirus NOT DETECTED NOT DETECTED Final   Rhinovirus / Enterovirus NOT DETECTED NOT DETECTED Final   Influenza A NOT DETECTED NOT DETECTED Final   Influenza B NOT DETECTED NOT DETECTED Final   Parainfluenza Virus 1 NOT DETECTED NOT DETECTED Final   Parainfluenza Virus 2 NOT DETECTED NOT DETECTED Final   Parainfluenza Virus 3 NOT DETECTED NOT DETECTED Final   Parainfluenza Virus 4 NOT DETECTED  NOT DETECTED Final   Respiratory Syncytial Virus NOT DETECTED NOT DETECTED Final   Bordetella pertussis NOT DETECTED NOT DETECTED Final   Bordetella Parapertussis NOT DETECTED NOT DETECTED Final   Chlamydophila pneumoniae NOT DETECTED NOT DETECTED Final   Mycoplasma pneumoniae NOT DETECTED NOT DETECTED Final    Comment: Performed at [facility], [place] [address] [place], [place] [address]  Culture, Respiratory w Gram Stain     Status: None   Collection Time: 03/28/21  2:10 PM   Specimen: Bronchoalveolar Lavage; Respiratory  Result Value Ref Range Status   Specimen Description BRONCHIAL ALVEOLAR LAVAGE  Final   Special Requests NONE  Final   Gram Stain   Final    FEW WBC PRESENT, PREDOMINANTLY MONONUCLEAR NO ORGANISMS SEEN    Culture   Final    NO GROWTH 2 DAYS Performed at [facility], [address], [place], [place] [address]    Report Status 03/31/2021 FINAL  Final  Acid Fast Smear (AFB)     Status: None   Collection Time: 03/28/21  2:10 PM   Specimen: Bronchial Alveolar Lavage  Result Value Ref Range Status   AFB Specimen Processing Concentration  Final   Acid Fast Smear Negative  Final    Comment: (NOTE) Performed At: [facility] [place], [place] [id number] Rush Farmer MD JI:[id number]    Source (AFB) BRONCHIAL ALVEOLAR LAVAGE  Final    Comment: RML Performed at [facility], [place] [address] [place], [place] [address]   Fungus Culture With Stain     Status: None (Preliminary result)   Collection Time: 03/28/21  2:10 PM   Specimen: Bronchial Alveolar Lavage  Result Value Ref Range Status   Fungus Stain Final report  Final    Comment: (NOTE) Performed At: [facility] [place], [place] [id number] Rush Farmer MD MP:[id number]    Fungus (Mycology) Culture PENDING  Incomplete   Fungal Source BRONCHIAL ALVEOLAR LAVAGE  Final    Comment: RML Performed at [facility], [place] [address]  [place], [place] [address]   Fungus Culture Result     Status: None   Collection Time: 03/28/21  2:10 PM  Result Value Ref Range Status   Result 1 Comment  Final    Comment: (NOTE) KOH/Calcofluor preparation:  no fungus observed. Performed At: [facility] [place], [place] [id number] Rush Farmer MD ZT:[id number]      Labs:  CBC: Recent Labs  Lab 03/28/21 0333 03/29/21 0342 03/31/21 0431 04/01/21 0837 04/02/21 0432 04/03/21 0653  WBC 30.2* 25.6* 20.5* 23.0*  --  21.1*  NEUTROABS  --   --   --  16.5*  --   --   HGB 8.1* 7.6* 7.8* 8.6* 8.9* 9.1*  HCT 27.6* 26.0* 26.2* 29.0* 30.2* 30.8*  MCV 63.7* 64.5* 62.4* 62.5*  --  63.6*  PLT 412* 370 338 356  --  336   BMP &GFR Recent Labs  Lab 03/28/21 0333 03/29/21 0342 03/30/21 0341 03/31/21 0431 04/01/21 0837 04/03/21 0653  NA 133* 134*  --  136 134* 136  K 4.9 5.0 4.3 4.5 4.4 4.3  CL 99 102  --  100 97* 99  CO2 28 27  --  _0 GLUCOSE 379* 303*  --  295* 213* 102*  BUN 25* 27*  --  24* 22 27*  CREATININE 1.16* 1.17*  --  1.17* 1.18* 1.10*  CALCIUM 9.3 8.7*  --  8.9 9.1 9.3  MG  --   --   --   --  2.0 2.0  PHOS  --   --   --   --  3.9 3.9   Estimated Creatinine Clearance: 58.3 mL/min (A) (by C-G formula based on SCr of 1.1 mg/dL (H)). Liver & Pancreas: Recent Labs  Lab 03/28/21 0333 03/29/21 0342 04/01/21 0837  AST 18 21  --   ALT 22 25  --   ALKPHOS 84 68  --   BILITOT 0.2* 0.2*  --   PROT 7.6 6.9  --   ALBUMIN 3.3* 3.1* 2.9*   No results for input(s): LIPASE, AMYLASE in the last 168 hours. No results for input(s): AMMONIA in the last 168 hours. Diabetic: No results for input(s): HGBA1C in the last 72 hours. Recent Labs  Lab 04/02/21 1610 04/02/21 2129 04/03/21 0746 04/03/21 1136 04/03/21 1404  GLUCAP 399* 302* 100* 126* 139*   Cardiac Enzymes: No results for input(s): CKTOTAL, CKMB, CKMBINDEX, TROPONINI in the last 168 hours. Recent Labs    03/20/21 1220  PROBNP 57.0    Coagulation Profile: No results for input(s): INR, PROTIME in the last 168 hours. Thyroid Function Tests: No results for input(s): TSH, T4TOTAL, FREET4, T3FREE, THYROIDAB in the last 72 hours. Lipid Profile: No results for input(s): CHOL, HDL, LDLCALC, TRIG, CHOLHDL, LDLDIRECT in the last 72 hours. Anemia Panel: No results for input(s): VITAMINB12, FOLATE, FERRITIN, TIBC, IRON, RETICCTPCT in the last 72 hours. Urine analysis:    Component Value Date/Time   COLORURINE YELLOW 03/23/2021 1732   APPEARANCEUR HAZY (A) 03/23/2021 1732   LABSPEC 1.017 03/23/2021 1732   PHURINE 5.0 03/23/2021 1732   GLUCOSEU NEGATIVE 03/23/2021 1732   HGBUR SMALL (A) 03/23/2021 1732   BILIRUBINUR NEGATIVE 03/23/2021 1732   KETONESUR NEGATIVE 03/23/2021 1732   PROTEINUR NEGATIVE 03/23/2021 1732   UROBILINOGEN 0.2 08/18/2008 1535   NITRITE NEGATIVE 03/23/2021 1732   LEUKOCYTESUR TRACE (A) 03/23/2021 1732   Sepsis Labs: Invalid input(s): PROCALCITONIN, LACTICIDVEN   Time coordinating discharge: 45 minutes  SIGNED:  Mercy Riding, MD  [facility] 04/03/2021, 3:34 PM  If 7PM-7AM, please contact night-coverage [email or web address]

## 2021-04-03 NOTE — Anesthesia Preprocedure Evaluation (Signed)
Anesthesia Evaluation  Patient identified by MRN, date of birth, ID band Patient awake    Reviewed: Allergy & Precautions, NPO status , Patient's Chart, lab work & pertinent test results  Airway Mallampati: III  TM Distance: >3 FB Neck ROM: Full    Dental no notable dental hx. (+) Missing, Poor Dentition, Dental Advisory Given,    Pulmonary sleep apnea and Continuous Positive Airway Pressure Ventilation , pneumonia, unresolved, COPD,  COPD inhaler, former smoker,  Quit smoking 2016, 11 pack year history   admitted on 03/23/21 with acute hypoxemic respiratory failure- on 7LPM Mil[place] (HFNC at night) She has been treated with doxycycline and azithromycin for concern of bronchitis.   Pulmonary exam normal breath sounds clear to auscultation       Cardiovascular hypertension, Pt. on medications pulmonary hypertension (mild pHTN)+ CAD (non-occlusive), + Past MI and +CHF (grade 2 diastolic dysfunction)  Normal cardiovascular exam+ Valvular Problems/Murmurs (mod MS)  Rhythm:Regular Rate:Normal  Echo 03/24/21: 1. Left ventricular ejection fraction, by estimation, is 60 to 65%. The  left ventricle has normal function. The left ventricle has no regional  wall motion abnormalities. There is moderate left ventricular hypertrophy  of the basal-septal segment. Left  ventricular diastolic parameters are consistent with Grade II diastolic  dysfunction (pseudonormalization). Elevated left ventricular end-diastolic  pressure.  2. Right ventricular systolic function is normal. The right ventricular  size is normal. There is mildly elevated pulmonary artery systolic  pressure. The estimated right ventricular systolic pressure is 11.9 mmHg.  3. Left atrial size was mildly dilated.  4. The mitral valve is abnormal. There is an ill defined density  measuring 3.23 x 2.10cm that appears to originate off the posterior MV  leaflet but cannot be definite on  this. There is also mitral annular  calcification present. There is moderate mitral  stenosis with a mean MVG of 49mHg. No evidence of mitral valve  regurgitation.  5. The aortic valve is normal in structure. Aortic valve regurgitation is  not visualized. No aortic stenosis is present.  6. The inferior vena cava is normal in size with greater than 50%  respiratory variability, suggesting right atrial pressure of 3 mmHg.  7. Recommend TEE for possible mitral valve endocarditis in setting of  abnormal mitral valve and fevers.    Neuro/Psych  Headaches, PSYCHIATRIC DISORDERS Anxiety Depression    GI/Hepatic negative GI ROS, Neg liver ROS,   Endo/Other  diabetes, Poorly Controlled, Type 2, Insulin DependentMorbid obesityBMI 41 Last a1c 8.0  Renal/GU Renal InsufficiencyRenal diseaseCr 1.16  negative genitourinary   Musculoskeletal  (+) Arthritis , Osteoarthritis,    Abdominal (+) + obese,   Peds  Hematology  (+) Blood dyscrasia, anemia , Thalassemia  hct 27.6   Anesthesia Other Findings   Reproductive/Obstetrics negative OB ROS                             Anesthesia Physical  Anesthesia Plan  ASA: 4  Anesthesia Plan: MAC   Post-op Pain Management:    Induction: Intravenous  PONV Risk Score and Plan: 3 and Ondansetron and Treatment may vary due to age or medical condition  Airway Management Planned: Natural Airway and Simple Face Mask  Additional Equipment: None  Intra-op Plan:   Post-operative Plan:   Informed Consent: I have reviewed the patients History and Physical, chart, labs and discussed the procedure including the risks, benefits and alternatives for the proposed anesthesia with the patient or authorized  representative who has indicated his/her understanding and acceptance.     Dental advisory given  Plan Discussed with: CRNA and Anesthesiologist  Anesthesia Plan Comments:         Anesthesia Quick Evaluation

## 2021-04-03 NOTE — Anesthesia Postprocedure Evaluation (Signed)
Anesthesia Post Note  Patient: Lynn [name]  Procedure(s) Performed: ESOPHAGOGASTRODUODENOSCOPY (EGD) WITH PROPOFOL BIOPSY     Patient location during evaluation: PACU Anesthesia Type: MAC Level of consciousness: awake and alert Pain management: pain level controlled Vital Signs Assessment: post-procedure vital signs reviewed and stable Respiratory status: spontaneous breathing, nonlabored ventilation, respiratory function stable and patient connected to nasal cannula oxygen Cardiovascular status: stable and blood pressure returned to baseline Postop Assessment: no apparent nausea or vomiting Anesthetic complications: no   No notable events documented.  Last Vitals:  Vitals:   04/03/21 1340 04/03/21 1345  BP: (!) 144/64 (!) 150/65  Pulse: 69 67  Resp: 20 (!) 24  Temp:    SpO2: 90% 91%    Last Pain:  Vitals:   04/03/21 1345  TempSrc:   PainSc: 0-No pain                 Shavar Gorka

## 2021-04-04 LAB — MISC LABCORP TEST (SEND OUT): Labcorp test code: 9985

## 2021-04-04 LAB — GLUCOSE 6 PHOSPHATE DEHYDROGENASE
G6PDH: 16.8 U/g{Hb} — ABNORMAL HIGH (ref 4.8–15.7)
Hemoglobin: 9.7 g/dL — ABNORMAL LOW (ref 11.1–15.9)

## 2021-04-04 LAB — SURGICAL PATHOLOGY

## 2021-04-04 NOTE — Progress Notes (Addendum)
[place] at [facility], [place], [place] [address] [phone]  Date:  04/05/2021   Name:  Lynn [name]   DOB:  [birth year]   MRN:  [id number]  PCP:  [name], MD    Chief Complaint: Hospitalization Follow-up (Pneumonia, hypoxia, script for oxygen tank)   History of Present Illness:  Lynn [name] is a 65 y.o. very pleasant female patient who presents with the following:  Pt here today for a hospital follow-up History of diabetes, hypertension, CAD/MI, COPD, sleep apnea, dyslipidemia, rheumatoid arthritis, thalassemia She is also seeing endocrinology- Dr Kelton Pillar- last visit in April  [place] [facility]ology - Leafy Kindle Methotrexate injection once a week  She was not feeling well at our last visit 6/6- body aches, fevers, cough We did a work up including D dimer which led to CT angio- negative  She continued to feel poorly and presented to the ER on 6/9- was admitted with hypoxic respiratory distress   Admit date: 03/23/2021 Discharge date: 04/03/2021 Recommendations for Outpatient Follow-up:  Follow ups as below.  Pulmonology to arrange outpatient follow-up. Please obtain CBC/BMP/Mag at follow up Please follow up on the following pending results: G6PD, QuantiFERON gold, hypersensitivity pneumonitis labs, duodenal and gastric biopsy, acid-fast culture, SPEP and UPEP and coccidioides antibodies. Home Health: None required Equipment/Devices: Home oxygen and bedside commode Discharge Condition: Stable CODE STATUS: Full code     Follow-up Information       [name], PA Follow up.   Specialties: Physician Assistant, Cardiology, Radiology Why: [facility] location - follow-up has been arranged Thursday May 18, 2021 11:15 AM (Arrive by 11:00 AM). Angie is one of our PAs with Dr. Ellyn Hack. Contact information: [address] [place]  [address] [phone]              [name], MD. Schedule an appointment as soon as possible for a visit in 1 week(s).   Specialty: Family Medicine Contact information: [place] [address] [address], [employer] Follow up.   Why: Your oxygen and bedside commode is from this company. We call the company [employer], please call this number if you have any problems or questions. Contact information: [address] [place] [address] [phone]                    Hospital Course: 65 year old F with PMH of RA on MTX, diastolic CHF, HTN, HLD, DM-2 and thalassemia presenting with fever, shortness of breath and dry cough that did not improve with p.o. doxycycline outpatient.  Patient was febrile but hemodynamically stable.  His Pro-Cal is low.  She completed antibiotic course for CAP coverage but continues to spike fever.  D-dimer was elevated but CTA negative for PE or any acute finding.  CRP and ESR elevated and trended up.  Full RVP panel, COVID-19, blood culture and BAL cultures negative.   TTE with ill-defined 3.23 x 2.1 cm mitral valve density raising concern for possible endocarditis. Infectious disease consulted.  Started on IV vancomycin.  Cardiology consulted and TEE concerning for large circumscribed mass adherent to the posterior annulus of MV measuring 1.8 x 1.4 cm, as well as moderate to severe mitral stenosis.  However, cardiac MRI and high-resolution which showed posterolateral mitral annulus mass consistent with degenerative mitral annular calcification.  She  also had CT abdomen and pelvis that was unrevealing.  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  IV vancomycin discontinued.  Pulmonology consulted and she had a BAL which is consistent lymphocytosis suggesting MTX or feather-pillow related hypersensitivity pneumonitis.  Autoimmune labs unrevealing.  Patient was started on systemic steroid.  Methotrexate  discontinued.  ID recommended Bactrim for PPx if she remains on significant dose of prednisone.     Patient's respiratory failure improved.  She maintained appropriate saturation on room air but desaturated to 87% with ambulation requiring 2 L to recover.  She was cleared for discharge by pulmonology on extended steroid taper as below.  Patient to to call her endocrinologist for close follow-up on her diabetic management while on steroid.  She uses insulin pump.  She says she can get a hold of the endocrinologist whenever she wants.  Patient will be on Bactrim DS 1 tablet daily for 3 weeks for prophylaxis while on significant dose of prednisone.  HCTZ discontinued out of concern for hyponatremia with Bactrim.    Patient had melanotic stool while in the hospital.  Hemoccult positive but H&H remained stable after starting PPI.  GI consulted and she underwent EGD that showed nonbleeding esophageal erosion/ulcer and 2 nonbleeding small gastric ulcers.  Biopsies obtained.  GI recommended discharge on p.o. Protonix twice daily.    See individual problem list below for more on hospital course.   Discharge Diagnoses:  Acute respiratory failure with hypoxia-unclear etiology despite extensive infectious and autoimmune work-up as above but concern for hypersensitivity pneumonitis related to MTX and feather pillow.  Improved. -Cleared for discharge by pulmonology -Pulmonology to follow-up on G6PD, QuantiFERON gold, hypersensitivity pneumonitis labs, acid-fast culture, SPEP and UPEP and coccidioides antibodies. -Extended prednisone taper per recommendation by pulmonology -Prophylactic Bactrim DS daily per recommendation by ID -On Protonix for GI prophylaxis. -Anoro Ellipta, Flonase and as needed albuterol -Encourage incentive spirometry -2 L oxygen by nasal cannula with ambulation/activity.   Fever of unknown origin: Due to pneumonitis?.  Infectious and autoimmune work-up unrevealing so far.  Last fever  6/11 -Completed CAP coverage.   -Vancomycin 6/11-6/15 out of concern for endocarditis which has been ruled out.   Cardiac mass: TTE and TEE raised concern for vegetation but found to be degenerative mitral annular calcification on cardiac MRI and high-resolution CT   Moderate to severe mitral stenosis -Follow-up with cardiology outpatient.   History of nonobstructive CAD/chronic diastolic CHF: Stable.  TTE and TEE reassuring.  Appears euvolemic.  Continue home medications   Essential hypertension: BP slightly elevated. -Continue losartan, Cardizem and Imdur -Discontinued home HCTZ while on Bactrim.   IDDM-2 with hyperglycemia: Hyperglycemia likely due to steroid.  Last CBG was postprandial. -Patient to resume insulin pump and contact her endocrinologist -Patient to continue home Victoza and statin.   Microcytic anemia/thalassemia/upper GI bleed: H&H stable.  Hemoccult positive. Recent Labs (within last 365 days)              Recent Labs    03/20/21 1220 03/23/21 1301 03/26/21 0402 03/27/21 0351 03/28/21 0333 03/29/21 0342 03/31/21 0431 04/01/21 0837 04/02/21 0432 04/03/21 0653  HGB 9.5* 9.3* 8.1* 8.0* 8.1* 7.6* 7.8* 8.6* 8.9* 9.1*    -EGD with nonbleeding esophageal erosion/ulcer and gastric ulcers. -P.o. Protonix 40 mg twice daily -Recheck CBC in 1 week   Paroxysmal atrial tachycardia/paroxysmal A. fib with RVR: RVR resolved.  Not on anticoagulation. -Continue home Cardizem CD. -No anticoagulation in the setting of positive Hemoccult/GI bleed. -Patient to discuss anticoagulation  with a cardiologist . At risk for sleep apnea -Needs outpatient sleep study.   History of rheumatoid arthritis: On methotrexate at home. Concerned about pneumonitis with MTX -Continue steroid as above. -Patient to discuss alternative treatment with her rheumatologist   Debility/generalized weakness -Oxygen and bedside commode ordered on discharge.  No other therapy needed identified.    Morbid obesity Body mass index is 39.36 kg/m.  -Patient is already on Victoza. -Encourage lifestyle change to lose weight   Pt notes that she is feeling better Her breathing is generally ok but she gets SOB with exertion She is using oxygen when she is walking, and at night when she is asleep She wonders about getting a concentrator She is waiting on her pulmonology and GI appts She will schedule to see rheumatology   She needs Inogen G5 or G4 if she can't get the 5  Fax to [employer] at [phone] att Intake Rep/ Harrington Challenger  She had a couple of low sugars the last day or so She is using her pump as needed  She had of glucose of 40- 79 yesterday and and last night She is not using the Humalog right now  She is also on a steroid taper right now- for several weeks   Patient Active Problem List   Diagnosis Date Noted   Anemia    Acute gastric ulcer with hemorrhage    Occult GI bleeding    Melena    Preop respiratory exam    ILD (interstitial lung disease) ([place])    Acute bacterial endocarditis    Acute bronchitis 03/24/2021   SOB (shortness of breath) 03/24/2021   FUO (fever of unknown origin) 03/24/2021   Hypokalemia 03/24/2021   Generalized weakness 03/24/2021   Coronary artery spasm ([place]) 08/09/2020   Rheumatoid arthritis ([place]) 11/09/2019   Toe pain, bilateral 05/26/2019   Type 2 diabetes mellitus with hyperglycemia, with long-term current use of insulin ([place]) 05/26/2019   Mobility impaired 04/01/2019   Morbid obesity due to excess calories ([place]) 12/03/2017   Dyspnea on exertion 12/02/2017   Mitral stenosis 08/23/2017   Chronic diastolic CHF (congestive heart failure) ([place]) 08/12/2017   Acute respiratory failure with hypoxia ([place]) 08/12/2017   OSA on CPAP 08/12/2017   COPD with acute bronchitis ([place]) 08/12/2017   Near syncope 07/03/2017   Anxiety 07/03/2017   Coronary artery disease, non-occlusive 12/08/2016   PAT (paroxysmal atrial tachycardia) ([place])    Demand  myocardial infarction ([place]) 11/16/2016   H/O total knee replacement 08/19/2015   Vision, loss, sudden 03/07/2015   S/P TKR (total knee replacement) using cement 02/18/2015   Thalassemia 09/02/2013   Hyperlipidemia with target low density lipoprotein (LDL) cholesterol less than 100 mg/dL 08/24/2012   Essential hypertension 07/22/2012    Past Medical History:  Diagnosis Date   Anxiety    Arthritis    "knees, wrists, back, elbows" (07/03/2017)   Clotting disorder ([place])    blood clot in eye 2016   Coronary artery disease, non-occlusive    a. 11/2016 NSTEMI/Cath: LM nl, LAD 40p, 79m, D1/2 small, LCX 40ost, OM2/3 nl/small, RCA 35 ost/mid, RPDA/RPL small/nl.   Coronary vasospasm (HCC)    Depression    Diastolic dysfunction    a. 11/2016 Echo: EF 55-60%, gr1 DD, sev calcified MV annulus, mildly dil LA.   Eye hemorrhage 01/2015   "right; resolved" (07/03/2017)   Headache    "monthly" (07/03/2017)   Heart murmur    History of blood transfusion    "when  I had an ectopic pregnancy"   History of stomach ulcers    Hyperlipidemia    Hypertension    Microcytic anemia    Mitral stenosis    OSA on CPAP    setting is unknown   PAT (paroxysmal atrial tachycardia) (HCC)    Pneumonia    "several times" (07/03/2017)   Premature atrial contractions    PVC's (premature ventricular contractions)    Syncope 07/03/2017 X 2   called seizures but no medications   Thalassemia    "my cells are sickle cell shaped but I don't have sickle cell anemia" (07/03/2017)   Type II diabetes mellitus ([place])     Past Surgical History:  Procedure Laterality Date   48-Hour Monitor  03/18/2017   Sinus rhythm with sinus tachycardia (rate 58-134 BPM)multiple PVCs noted with couplets and bigeminy. One triplet. 7 runs of PAT ranging from 100-130 bpm. Longest was 33 beats.   BREAST BIOPSY Left    BRONCHIAL WASHINGS  03/28/2021   Procedure: BRONCHIAL WASHINGS;  Surgeon: Julian Hy, DO;  Location: [facility];  Service:  Endoscopy;;   CARDIAC CATHETERIZATION N/A 11/19/2016   Procedure: Left Heart Cath and Coronary Angiography;  Surgeon: Belva Crome, MD;  Location: [facility];  Service: Cardiovascular.    LM nl, LAD 40p, 37m, D1/2 small, LCX 40ost, OM2/3 nl/small, RCA 35 ost/mid, RPDA/RPL small/nl.   CARPAL TUNNEL RELEASE Right 11/01/2014   COLONOSCOPY     DILATION AND CURETTAGE OF UTERUS     ECTOPIC PREGNANCY SURGERY  X 2   JOINT REPLACEMENT     KNEE ARTHROSCOPY Left 11/2014   TEE WITHOUT CARDIOVERSION N/A 03/28/2021   Procedure: TRANSESOPHAGEAL ECHOCARDIOGRAM (TEE);  Surgeon: NAcie FredricksonPWonda Cheng MD;  [facility];  Service: Cardiovascular;  Laterality: N/A;   TONSILLECTOMY     TOTAL KNEE ARTHROPLASTY Right 02/18/2015   Procedure: RIGHT TOTAL KNEE ARTHROPLASTY;  Surgeon: SNetta Cedars MD;  [facility]  Service: Orthopedics;  Laterality: Right;   TOTAL KNEE ARTHROPLASTY Left 08/19/2015   Procedure: LEFT TOTAL KNEE ARTHROPLASTY;  Surgeon: SNetta Cedars MD;  [facility]  Service: Orthopedics;  Laterality: Left;   TRANSTHORACIC ECHOCARDIOGRAM  11/2016; 07/2017:   a) normal LV size, thickness and function. EF 55-60%. GR 1 DD.No RWMA severe mitral annular calcification, but no mitral stenosis. Mild LA dilation;; b) normal LV size and function.  EF 65-75%.  Unable to assess diastolic function.  Aortic sclerosis with no stenosis.  Moderate mitral stenosis? (Gradient 9 mmHg) -?  Mild-mod left atrial enlargement    TRANSTHORACIC ECHOCARDIOGRAM  01/2019   EF 65 to 70%.  Normal function.  Elevated LVEDP-GR 1 DD.  Normal RV size and function.  Large calcific mass on posterior mitral leaflet mild to moderate mitral stenosis.   VAGINAL HYSTERECTOMY     VIDEO BRONCHOSCOPY N/A 03/28/2021   Procedure: VIDEO BRONCHOSCOPY WITHOUT FLUORO;  Surgeon: CJulian Hy DO;  [facility];  Service: Endoscopy;  Laterality: N/A;    Social History   Tobacco Use   Smoking status: Former    Packs/day:  0.25    Years: 44.00    Pack years: 11.00    Types: Cigarettes    Quit date: 02/17/2015    Years since quitting: 6.1   Smokeless tobacco: Never  Vaping Use   Vaping Use: Never used  Substance Use Topics   Alcohol use: Yes    Alcohol/week: 0.0 standard drinks    Comment: 07/03/2017 "might have a  few drinks/year"   Drug use: No    Family History  Problem Relation Age of Onset   Cancer Mother    Diabetes Mother    Hypertension Mother    Hyperlipidemia Mother    Cancer Father    Hyperlipidemia Father    Mental illness Sister    Diabetes Sister    Diabetes Maternal Grandmother    Diabetes Maternal Grandfather    Colon cancer Neg Hx    Esophageal cancer Neg Hx    Stomach cancer Neg Hx    Rectal cancer Neg Hx     Allergies  Allergen Reactions   Penicillins Hives    Childhood allergy Has patient had a PCN reaction causing immediate rash, facial/tongue/throat swelling, SOB or lightheadedness with hypotension: Yes Has patient had a PCN reaction causing severe rash involving mucus membranes or skin necrosis: No Has patient had a PCN reaction that required hospitalization No Has patient had a PCN reaction occurring within the last 10 years: No If all of the above answers are "NO", then may proceed with Cephalosporin use.   [name] [Dapagliflozin] Other (See Comments)    Dizzy and lethargic    Metformin And Related     Diarrhea, bleeding    Medication list has been reviewed and updated.  Current Outpatient Medications on File Prior to Visit  Medication Sig Dispense Refill   acetaminophen (TYLENOL) 500 MG tablet Take 500 mg by mouth every 6 (six) hours as needed for fever.     albuterol (VENTOLIN HFA) 108 (90 Base) MCG/ACT inhaler Inhale 2 puffs into the lungs every 6 (six) hours as needed for wheezing or shortness of breath. 8 g 2   atorvastatin (LIPITOR) 80 MG tablet TAKE 1 TABLET(80 MG) BY MOUTH DAILY AT 6 PM 90 tablet 3   cetirizine (ZYRTEC) 10 MG tablet Take 10 mg by  mouth daily as needed for allergies.     Continuous Blood Gluc Sensor (DEXCOM G6 SENSOR) MISC 1 Device by Does not apply route as directed. 3 each 6   Continuous Blood Gluc Transmit (DEXCOM G6 TRANSMITTER) MISC 1 Device by Does not apply route as directed. 1 each 3   fluticasone (FLOVENT DISKUS) 50 MCG/BLIST diskus inhaler Inhale 1 puff into the lungs 2 (two) times daily. 1 each 0   folic acid (FOLVITE) 1 MG tablet Take 2 tablets (2 mg total) by mouth daily. 180 tablet 3   glucose blood (CONTOUR NEXT TEST) test strip 3x daily 300 each 11   Insulin Disposable Pump (OMNIPOD DASH PODS, GEN 4,) MISC Inject 1 Dose into the skin continuous. Using pump - depends on carb intake.     insulin lispro (HUMALOG) 100 UNIT/ML injection Inject 8 Units into the skin See admin instructions. Back up for pump.     Insulin Pen Needle (B-D UF III MINI PEN NEEDLES) 31G X 5 MM MISC USE DAILY WITH VICTOZA AND LANTUS. 400 each 1   Insulin Pen Needle 31G X 5 MM MISC 1 Device by Does not apply route 3 (three) times daily. 150 each 11   isosorbide mononitrate (IMDUR) 30 MG 24 hr tablet Take 30 mg  tablet as needed for Chest pain spasm episodes for 3 days and the stop (Patient taking differently: Take 30 mg  tablet as needed for Chest pain spasm episodes for 3 days and the stop) 20 tablet 7   Lancets 30G MISC 1 Device by Does not apply route 3 (three) times daily. 300 each 9   Multiple  Vitamin (MULTIVITAMIN WITH MINERALS) TABS tablet Take 1 tablet by mouth daily.     pantoprazole (PROTONIX) 40 MG tablet Take 1 tablet (40 mg total) by mouth 2 (two) times daily. 180 tablet 0   predniSONE (DELTASONE) 10 MG tablet Take 4 tablets (40 mg total) by mouth daily with breakfast for 7 days, THEN 3 tablets (30 mg total) daily with breakfast for 7 days, THEN 2 tablets (20 mg total) daily with breakfast for 7 days, THEN 1 tablet (10 mg total) daily with breakfast for 7 days, THEN 0.5 tablets (5 mg total) daily with breakfast for 7 days, THEN 0.5  tablets (5 mg total) every Monday, Wednesday, and Friday for 14 days. 77 tablet 0   SAFETY-LOK TB SYRINGE 27GX.5" 27G X 1/2" 1 ML MISC      sulfamethoxazole-trimethoprim (BACTRIM DS) 800-160 MG tablet Take 1 tablet by mouth daily. 21 tablet 0   umeclidinium-vilanterol (ANORO ELLIPTA) 62.5-25 MCG/INH AEPB Inhale 1 puff into the lungs daily. 1 each 1   VICTOZA 18 MG/3ML SOPN INJECT 1.8 MG UNDER THE SKIN ONCE DAILY 15 mL 3   diltiazem (CARDIZEM CD) 240 MG 24 hr capsule Take 1 capsule (240 mg total) by mouth daily. 90 capsule 3   losartan (COZAAR) 100 MG tablet Take 1 tablet (100 mg total) by mouth daily. 90 tablet 3   No current facility-administered medications on file prior to visit.    Review of Systems:  As per HPI- otherwise negative. She is eating ok Some mild nausea - she is taking protonix  Fevers are resolved  Septra for 3 weeks   Physical Examination: Vitals:   04/05/21 1059  BP: 118/72  Pulse: 89  Resp: 20  Temp: (!) 97.1 F (36.2 C)  SpO2: 99%   Vitals:   04/05/21 1059  Weight: 235 lb (106.6 kg)  Height: _0  (1.626 m)   Body mass index is 40.34 kg/m. Ideal Body Weight: Weight in (lb) to have BMI = 25: 145.3  GEN: no acute distress.  Obese, looks well and is wearing oxygen at 2 L HEENT: Atraumatic, Normocephalic.  Ears and Nose: No external deformity. CV: RRR, No M/G/R. No JVD. No thrill. No extra heart sounds. PULM: CTA B, no wheezes, crackles, rhonchi. No retractions. No resp. distress. No accessory muscle use. ABD: S, NT, ND, +BS. No rebound. No HSM. EXTR: No c/c/e PSYCH: Normally interactive. Conversant.    Assessment and Plan: Hospital discharge follow-up  Acute anemia - Plan: CBC  Type 2 diabetes mellitus with hyperglycemia, with long-term current use of insulin (HCC) - Plan: Comprehensive metabolic panel  Thalassemia, unspecified type  Medication monitoring encounter - Plan: Magnesium  Dyspnea on exertion - Plan: fluticasone (FLOVENT DISKUS)  50 MCG/BLIST diskus inhaler  Following up today after prolonged hospitalization with apparent pneumonitis of somewhat uncertain etiology.  Her symptoms may also be secondary to methotrexate which has been stopped  She is using oxygen via nasal cannula, using during exertion and at night.  She would like a home concentrator to allow her to be more mobile, I have ordered this for her today  She has diabetes and uses an insulin pump.  She is currently on steroids and will be using them for several weeks.  Interestingly, she has had low blood sugars, as low as 40 since she was discharged home.  I have contacted her endocrinologist now to ask for advice and pump management.  In the meantime, I advised patient to err on the side of  higher blood sugars and to keep a source of glucose with her at all times  I will obtain lab work as requested by inpatient team  Refilled Flovent  We went over her other follow-up appointments-she will let me know if she needs any assistance This visit occurred during the SARS-CoV-2 public health emergency.  Safety protocols were in place, including screening questions prior to the visit, additional usage of staff PPE, and extensive cleaning of exam room while observing appropriate contact time as indicated for disinfecting solutions.   Signed Lamar Blinks, MD  Received her labs as below, message to pt Results for orders placed or performed in visit on 04/05/21  CBC  Result Value Ref Range   WBC 17.8 (H) 4.0 - 10.5 K/uL   RBC 5.15 (H) 3.87 - 5.11 Mil/uL   Platelets 336.0 150.0 - 400.0 K/uL   Hemoglobin 9.5 (L) 12.0 - 15.0 g/dL   HCT 31.6 (L) 36.0 - 46.0 %   MCV 61.3 Repeated and verified X2. (L) 78.0 - 100.0 fl   MCHC 30.0 30.0 - 36.0 g/dL   RDW 19.4 (H) 11.5 - 15.5 %  Comprehensive metabolic panel  Result Value Ref Range   Sodium 136 135 - 145 mEq/L   Potassium 4.4 3.5 - 5.1 mEq/L   Chloride 99 96 - 112 mEq/L   CO2 28 19 - 32 mEq/L   Glucose, Bld 121 (H)  70 - 99 mg/dL   BUN 22 6 - 23 mg/dL   Creatinine, Ser 1.37 (H) 0.40 - 1.20 mg/dL   Total Bilirubin 0.4 0.2 - 1.2 mg/dL   Alkaline Phosphatase 81 39 - 117 U/L   AST 19 0 - 37 U/L   ALT 44 (H) 0 - 35 U/L   Total Protein 7.0 6.0 - 8.3 g/dL   Albumin 3.4 (L) 3.5 - 5.2 g/dL   GFR 40.04 (L) >60.00 mL/min   Calcium 8.8 8.4 - 10.5 mg/dL  Magnesium  Result Value Ref Range   Magnesium 2.0 1.5 - 2.5 mg/dL

## 2021-04-05 ENCOUNTER — Encounter: Payer: Self-pay | Admitting: Family Medicine

## 2021-04-05 ENCOUNTER — Encounter (HOSPITAL_COMMUNITY): Payer: Self-pay | Admitting: Gastroenterology

## 2021-04-05 ENCOUNTER — Other Ambulatory Visit: Payer: Self-pay

## 2021-04-05 ENCOUNTER — Ambulatory Visit (INDEPENDENT_AMBULATORY_CARE_PROVIDER_SITE_OTHER): Payer: Medicare Other | Admitting: Family Medicine

## 2021-04-05 ENCOUNTER — Other Ambulatory Visit (HOSPITAL_BASED_OUTPATIENT_CLINIC_OR_DEPARTMENT_OTHER): Payer: Self-pay

## 2021-04-05 VITALS — BP 118/72 | HR 89 | Temp 97.1°F | Resp 20 | Ht 64.0 in | Wt 235.0 lb

## 2021-04-05 DIAGNOSIS — E1165 Type 2 diabetes mellitus with hyperglycemia: Secondary | ICD-10-CM

## 2021-04-05 DIAGNOSIS — Z794 Long term (current) use of insulin: Secondary | ICD-10-CM

## 2021-04-05 DIAGNOSIS — R0609 Other forms of dyspnea: Secondary | ICD-10-CM

## 2021-04-05 DIAGNOSIS — Z09 Encounter for follow-up examination after completed treatment for conditions other than malignant neoplasm: Secondary | ICD-10-CM | POA: Diagnosis not present

## 2021-04-05 DIAGNOSIS — R0600 Dyspnea, unspecified: Secondary | ICD-10-CM | POA: Diagnosis not present

## 2021-04-05 DIAGNOSIS — Z5181 Encounter for therapeutic drug level monitoring: Secondary | ICD-10-CM | POA: Diagnosis not present

## 2021-04-05 DIAGNOSIS — D569 Thalassemia, unspecified: Secondary | ICD-10-CM | POA: Diagnosis not present

## 2021-04-05 DIAGNOSIS — D649 Anemia, unspecified: Secondary | ICD-10-CM | POA: Diagnosis not present

## 2021-04-05 LAB — COMPREHENSIVE METABOLIC PANEL
ALT: 44 U/L — ABNORMAL HIGH (ref 0–35)
AST: 19 U/L (ref 0–37)
Albumin: 3.4 g/dL — ABNORMAL LOW (ref 3.5–5.2)
Alkaline Phosphatase: 81 U/L (ref 39–117)
BUN: 22 mg/dL (ref 6–23)
CO2: 28 mEq/L (ref 19–32)
Calcium: 8.8 mg/dL (ref 8.4–10.5)
Chloride: 99 mEq/L (ref 96–112)
Creatinine, Ser: 1.37 mg/dL — ABNORMAL HIGH (ref 0.40–1.20)
GFR: 40.04 mL/min — ABNORMAL LOW (ref >60.00)
Glucose, Bld: 121 mg/dL — ABNORMAL HIGH (ref 70–99)
Potassium: 4.4 mEq/L (ref 3.5–5.1)
Sodium: 136 mEq/L (ref 135–145)
Total Bilirubin: 0.4 mg/dL (ref 0.2–1.2)
Total Protein: 7 g/dL (ref 6.0–8.3)

## 2021-04-05 LAB — CBC
HCT: 31.6 % — ABNORMAL LOW (ref 36.0–46.0)
Hemoglobin: 9.5 g/dL — ABNORMAL LOW (ref 12.0–15.0)
MCHC: 30 g/dL (ref 30.0–36.0)
MCV: 61.3 fl — ABNORMAL LOW (ref 78.0–100.0)
Platelets: 336 10*3/uL (ref 150.0–400.0)
RBC: 5.15 Mil/uL — ABNORMAL HIGH (ref 3.87–5.11)
RDW: 19.4 % — ABNORMAL HIGH (ref 11.5–15.5)
WBC: 17.8 10*3/uL — ABNORMAL HIGH (ref 4.0–10.5)

## 2021-04-05 LAB — MAGNESIUM: Magnesium: 2 mg/dL (ref 1.5–2.5)

## 2021-04-05 MED ORDER — FLOVENT DISKUS 50 MCG/BLIST IN AEPB
1.0000 | INHALATION_SPRAY | Freq: Two times a day (BID) | RESPIRATORY_TRACT | 3 refills | Status: DC
  Filled 2021-04-05: qty 60, 30d supply, fill #0

## 2021-04-05 NOTE — Patient Instructions (Signed)
It was good to see you again!  I will let Dr Kelton Pillar know that you are having low glucose so she can reach out to you asap Keep a source of glucose with you at all time I will request the oxygen concentrator for you Please let me know if you don't hear from pulmonology or GI Also please follow-up with Marita [place] once things calm down

## 2021-04-06 ENCOUNTER — Telehealth: Payer: Self-pay

## 2021-04-06 ENCOUNTER — Other Ambulatory Visit: Payer: Self-pay | Admitting: *Deleted

## 2021-04-06 DIAGNOSIS — J9601 Acute respiratory failure with hypoxia: Secondary | ICD-10-CM | POA: Diagnosis not present

## 2021-04-06 DIAGNOSIS — R0902 Hypoxemia: Secondary | ICD-10-CM

## 2021-04-06 DIAGNOSIS — I348 Other nonrheumatic mitral valve disorders: Secondary | ICD-10-CM | POA: Diagnosis not present

## 2021-04-06 DIAGNOSIS — I5032 Chronic diastolic (congestive) heart failure: Secondary | ICD-10-CM | POA: Diagnosis not present

## 2021-04-06 DIAGNOSIS — I5030 Unspecified diastolic (congestive) heart failure: Secondary | ICD-10-CM | POA: Diagnosis not present

## 2021-04-06 DIAGNOSIS — I110 Hypertensive heart disease with heart failure: Secondary | ICD-10-CM | POA: Diagnosis not present

## 2021-04-06 DIAGNOSIS — J679 Hypersensitivity pneumonitis due to unspecified organic dust: Secondary | ICD-10-CM | POA: Diagnosis not present

## 2021-04-06 DIAGNOSIS — M6281 Muscle weakness (generalized): Secondary | ICD-10-CM | POA: Diagnosis not present

## 2021-04-06 DIAGNOSIS — D649 Anemia, unspecified: Secondary | ICD-10-CM | POA: Diagnosis not present

## 2021-04-06 DIAGNOSIS — I480 Paroxysmal atrial fibrillation: Secondary | ICD-10-CM | POA: Diagnosis not present

## 2021-04-06 DIAGNOSIS — J849 Interstitial pulmonary disease, unspecified: Secondary | ICD-10-CM | POA: Diagnosis not present

## 2021-04-06 DIAGNOSIS — J189 Pneumonia, unspecified organism: Secondary | ICD-10-CM

## 2021-04-06 DIAGNOSIS — S83242A Other tear of medial meniscus, current injury, left knee, initial encounter: Secondary | ICD-10-CM | POA: Diagnosis not present

## 2021-04-06 DIAGNOSIS — K253 Acute gastric ulcer without hemorrhage or perforation: Secondary | ICD-10-CM | POA: Diagnosis not present

## 2021-04-06 DIAGNOSIS — E1165 Type 2 diabetes mellitus with hyperglycemia: Secondary | ICD-10-CM | POA: Diagnosis not present

## 2021-04-06 LAB — HYPERSENSITIVITY PNEUMONITIS
A. Pullulans Abs: POSITIVE — AB
A.Fumigatus #1 Abs: NEGATIVE
Micropolyspora faeni, IgG: NEGATIVE
Pigeon Serum Abs: NEGATIVE
Thermoact. Saccharii: NEGATIVE
Thermoactinomyces vulgaris, IgG: NEGATIVE

## 2021-04-06 NOTE — Telephone Encounter (Signed)
Ok- I do not think there is a significant interaction here - will monitor her K

## 2021-04-06 NOTE — Telephone Encounter (Signed)
Verbal orders given for PT  2 week 1 1 week 1 2 week 1 1 week 1   Nurse with [facility] did want to let you know she was flagged for interaction between patients bactrim and her losartan.

## 2021-04-07 ENCOUNTER — Other Ambulatory Visit: Payer: Self-pay

## 2021-04-07 ENCOUNTER — Encounter: Payer: Self-pay | Admitting: Family Medicine

## 2021-04-07 ENCOUNTER — Ambulatory Visit (INDEPENDENT_AMBULATORY_CARE_PROVIDER_SITE_OTHER): Payer: Medicare Other | Admitting: Internal Medicine

## 2021-04-07 ENCOUNTER — Telehealth: Payer: Self-pay | Admitting: Internal Medicine

## 2021-04-07 VITALS — Ht 63.0 in | Wt 232.2 lb

## 2021-04-07 DIAGNOSIS — E1165 Type 2 diabetes mellitus with hyperglycemia: Secondary | ICD-10-CM

## 2021-04-07 DIAGNOSIS — Z794 Long term (current) use of insulin: Secondary | ICD-10-CM | POA: Diagnosis not present

## 2021-04-07 DIAGNOSIS — E1159 Type 2 diabetes mellitus with other circulatory complications: Secondary | ICD-10-CM

## 2021-04-07 DIAGNOSIS — E1139 Type 2 diabetes mellitus with other diabetic ophthalmic complication: Secondary | ICD-10-CM

## 2021-04-07 LAB — QUANTIFERON-TB GOLD PLUS (RQFGPL)
QuantiFERON Mitogen Value: 1.2 IU/mL
QuantiFERON Nil Value: 0.02 IU/mL
QuantiFERON TB1 Ag Value: 0.37 IU/mL
QuantiFERON TB2 Ag Value: 0 IU/mL

## 2021-04-07 LAB — QUANTIFERON-TB GOLD PLUS: QuantiFERON-TB Gold Plus: POSITIVE — AB

## 2021-04-07 NOTE — Telephone Encounter (Signed)
Lynn [name],  Lynn [name] is asking to be upgraded to the Omni pod 5, but I told her I was under the impression this is for type I diabetics at this point and she is a type II diabetic.  I might correct or did something change? Thank you

## 2021-04-07 NOTE — Progress Notes (Signed)
Name: Lynn [name]  Age/ Sex: 65 y.o., female   MRN/ DOB: [id number], 12/03/1955     PCP: [name], MD   Reason for Endocrinology Evaluation: Type 2 Diabetes Mellitus  Initial Endocrine Consultative Visit: 03/31/2019    PATIENT IDENTIFIER: Lynn [name] is a 65 y.o. female with a past medical history of T2DM, HTN, dyslipidemia,thalassemia, OSA on CPAP . The patient has followed with Endocrinology clinic since 03/31/2019 for consultative assistance with management of her diabetes.  DIABETIC HISTORY:  Lynn [name] was diagnosed with T2DM in 2010, She has been on metformin in the past with reported prior intolerance due to diarrhea. Was on Glipizide at some point with no side effects.Has been on insulin for years. Her hemoglobin A1c has ranged from  7.7% in 2018, peaking at 10.4% in 2020.  On her initial presentation to our clinic, her A1c was 10.4% , she was on Lantus and Victoza    Lynn [name] was tried in 03/2019 but developed severe dizziness .     Injectable methotrexate was started November 2020 due to rheumatoid arthritis.  Patient is intolerant to oral methotrexate   Started on Omnipod 10/2020  SUBJECTIVE:   During the last visit (12/07/2020): A1c 8.5 % we continued  Lantus and  Victoza, restarted Humalog       Today (04/07/2021): Lynn [name] is here for a month follow up on diabetes management. She has the Omnipod. But was recently discharged from the hospital and this disrupted the pump use.  She was admitted for pneumonitis due to MTX   She is currently on Prednisone for another 6-8 weeks  She admits to not entering carbs  She is on oxygen      She checks her blood sugars multiple times daily through CGM. The patient has  had hypoglycemic episodes since the last clinic visit.   This patient with type 2 diabetes is treated with Omnipod (insulin pump). During the visit the pump basal and bolus doses were reviewed including carb/insulin  rations and supplemental doses. The clinical list was updated. The glucose meter download was reviewed in detail to determine if the current pump settings are providing the best glycemic control without excessive hypoglycemia.  Pump and meter download:    Pump   Omnipod Settings   Insulin type   Humalog    Basal rate       0000  2.05 u/h               I:C ratio       0000 1:10                  Sensitivity       0000  20      Goal       0000  100-110             Type & Model of Pump: Omnipod Insulin Type: Currently using Humalog .  PUMP STATISTICS: Average BG: 234 BG Readings: 1.4/day Average Daily Carbs (g): 0 Average Total Daily Insulin: 62.9 Average Daily Basal: 10.9 (61 %) Average Daily Bolus: 7.1 (39%)       HOME DIABETES REGIMEN:  Victoza 1.8 mg daily  Humalog     CONTINUOUS GLUCOSE MONITORING RECORD INTERPRETATION    Dates of Recording: 6/11 - 04/07/2021  Sensor description:Dexcom  Results statistics:   CGM use % of time 29  Average and SD 202/58  Time in range   3 7 %  % Time  Above 180 28  % Time above 250 29  % Time Below target 0    Glycemic patterns summary: Hyperglycemia is noted most of the day and night, with sporadic hypoglycemic episodes in between Hyperglycemic episodes  Post prandial  Hypoglycemic episodes occurred no pattern, at night and at times during the day  Overnight periods: Fluctuates        HISTORY:  Past Medical History:  Past Medical History:  Diagnosis Date   Anxiety    Arthritis    "knees, wrists, back, elbows" (07/03/2017)   Clotting disorder ([place])    blood clot in eye 2016   Coronary artery disease, non-occlusive    a. 11/2016 NSTEMI/Cath: LM nl, LAD 40p, 31md, D1/2 small, LCX 40ost, OM2/3 nl/small, RCA 35 ost/mid, RPDA/RPL small/nl.   Coronary vasospasm ([facility])    Depression    Diastolic dysfunction    a. 11/2016 Echo: EF 55-60%, gr1 DD, sev calcified MV annulus, mildly dil LA.   Eye  hemorrhage 01/2015   "right; resolved" (07/03/2017)   Headache    "monthly" (07/03/2017)   Heart murmur    History of blood transfusion    "when I had an ectopic pregnancy"   History of stomach ulcers    Hyperlipidemia    Hypertension    Microcytic anemia    Mitral stenosis    OSA on CPAP    setting is unknown   PAT (paroxysmal atrial tachycardia) ([facility])    Pneumonia    "several times" (07/03/2017)   Premature atrial contractions    PVC's (premature ventricular contractions)    Syncope 07/03/2017 X 2   called seizures but no medications   Thalassemia    "my cells are sickle cell shaped but I don't have sickle cell anemia" (07/03/2017)   Type II diabetes mellitus ([place])    Past Surgical History:  Past Surgical History:  Procedure Laterality Date   48-Hour Monitor  03/18/2017   Sinus rhythm with sinus tachycardia (rate 58-134 BPM)multiple PVCs noted with couplets and bigeminy. One triplet. 7 runs of PAT ranging from 100-130 bpm. Longest was 33 beats.   BIOPSY  04/03/2021   Procedure: BIOPSY;  Surgeon: Yetta Flock, MD;  Location: [facility];  Service: Gastroenterology;;   BREAST BIOPSY Left    BRONCHIAL WASHINGS  03/28/2021   Procedure: BRONCHIAL WASHINGS;  Surgeon: Julian Hy, DO;  Location: [facility];  Service: Endoscopy;;   CARDIAC CATHETERIZATION N/A 11/19/2016   Procedure: Left Heart Cath and Coronary Angiography;  Surgeon: Belva Crome, MD;  Location: [facility];  Service: Cardiovascular.    LM nl, LAD 40p, 59md, D1/2 small, LCX 40ost, OM2/3 nl/small, RCA 35 ost/mid, RPDA/RPL small/nl.   CARPAL TUNNEL RELEASE Right 11/01/2014   COLONOSCOPY     DILATION AND CURETTAGE OF UTERUS     ECTOPIC PREGNANCY SURGERY  X 2   ESOPHAGOGASTRODUODENOSCOPY (EGD) WITH PROPOFOL N/A 04/03/2021   Procedure: ESOPHAGOGASTRODUODENOSCOPY (EGD) WITH PROPOFOL;  Surgeon: Yetta Flock, MD;  Location: [facility];  Service: Gastroenterology;  Laterality: N/A;   JOINT  REPLACEMENT     KNEE ARTHROSCOPY Left 11/2014   TEE WITHOUT CARDIOVERSION N/A 03/28/2021   Procedure: TRANSESOPHAGEAL ECHOCARDIOGRAM (TEE);  Surgeon: Acie Fredrickson Wonda Cheng, MD;  Location: [facility];  Service: Cardiovascular;  Laterality: N/A;   TONSILLECTOMY     TOTAL KNEE ARTHROPLASTY Right 02/18/2015   Procedure: RIGHT TOTAL KNEE ARTHROPLASTY;  Surgeon: Netta Cedars, MD;  Location: [facility];  Service: Orthopedics;  Laterality: Right;   TOTAL  KNEE ARTHROPLASTY Left 08/19/2015   Procedure: LEFT TOTAL KNEE ARTHROPLASTY;  Surgeon: Netta Cedars, MD;  Location: [facility];  Service: Orthopedics;  Laterality: Left;   TRANSTHORACIC ECHOCARDIOGRAM  11/2016; 07/2017:   a) normal LV size, thickness and function. EF 55-60%. GR 1 DD.No RWMA severe mitral annular calcification, but no mitral stenosis. Mild LA dilation;; b) normal LV size and function.  EF 65-75%.  Unable to assess diastolic function.  Aortic sclerosis with no stenosis.  Moderate mitral stenosis? (Gradient 9 mmHg) -?  Mild-mod left atrial enlargement    TRANSTHORACIC ECHOCARDIOGRAM  01/2019   EF 65 to 70%.  Normal function.  Elevated LVEDP-GR 1 DD.  Normal RV size and function.  Large calcific mass on posterior mitral leaflet mild to moderate mitral stenosis.   VAGINAL HYSTERECTOMY     VIDEO BRONCHOSCOPY N/A 03/28/2021   Procedure: VIDEO BRONCHOSCOPY WITHOUT FLUORO;  Surgeon: Julian Hy, DO;  Location: [facility];  Service: Endoscopy;  Laterality: N/A;   Social History:  reports that she quit smoking about 6 years ago. Her smoking use included cigarettes. She has a 11.00 pack-year smoking history. She has never used smokeless tobacco. She reports current alcohol use. She reports that she does not use drugs. Family History:  Family History  Problem Relation Age of Onset   Cancer Mother    Diabetes Mother    Hypertension Mother    Hyperlipidemia Mother    Cancer Father    Hyperlipidemia Father    Mental illness Sister    Diabetes Sister     Diabetes Maternal Grandmother    Diabetes Maternal Grandfather    Colon cancer Neg Hx    Esophageal cancer Neg Hx    Stomach cancer Neg Hx    Rectal cancer Neg Hx      HOME MEDICATIONS: Allergies as of 04/07/2021       Reactions   Penicillins Hives   Childhood allergy Has patient had a PCN reaction causing immediate rash, facial/tongue/throat swelling, SOB or lightheadedness with hypotension: Yes Has patient had a PCN reaction causing severe rash involving mucus membranes or skin necrosis: No Has patient had a PCN reaction that required hospitalization No Has patient had a PCN reaction occurring within the last 10 years: No If all of the above answers are "NO", then may proceed with Cephalosporin use.   Farxiga [dapagliflozin] Other (See Comments)   Dizzy and lethargic   Metformin And Related    Diarrhea, bleeding        Medication List        Accurate as of April 07, 2021 12:46 PM. If you have any questions, ask your nurse or doctor.          acetaminophen 500 MG tablet Commonly known as: TYLENOL Take 500 mg by mouth every 6 (six) hours as needed for fever.   albuterol 108 (90 Base) MCG/ACT inhaler Commonly known as: VENTOLIN HFA Inhale 2 puffs into the lungs every 6 (six) hours as needed for wheezing or shortness of breath.   Anoro Ellipta 62.5-25 MCG/INH Aepb Generic drug: umeclidinium-vilanterol Inhale 1 puff into the lungs daily.   atorvastatin 80 MG tablet Commonly known as: LIPITOR TAKE 1 TABLET(80 MG) BY MOUTH DAILY AT 6 PM   B-D UF III MINI PEN NEEDLES 31G X 5 MM Misc Generic drug: Insulin Pen Needle USE DAILY WITH VICTOZA AND LANTUS.   Insulin Pen Needle 31G X 5 MM Misc 1 Device by Does not apply route 3 (three) times daily.  cetirizine 10 MG tablet Commonly known as: ZYRTEC Take 10 mg by mouth daily as needed for allergies.   Contour Next Test test strip Generic drug: glucose blood 3x daily   Dexcom G6 Sensor Misc 1 Device by Does not  apply route as directed.   Dexcom G6 Transmitter Misc 1 Device by Does not apply route as directed.   diltiazem 240 MG 24 hr capsule Commonly known as: CARDIZEM CD Take 1 capsule (240 mg total) by mouth daily.   Flovent Diskus 50 MCG/BLIST diskus inhaler Generic drug: fluticasone Inhale 1 puff by mouth  into the lungs 2 (two) times daily.   folic acid 1 MG tablet Commonly known as: FOLVITE Take 2 tablets (2 mg total) by mouth daily.   insulin lispro 100 UNIT/ML injection Commonly known as: HUMALOG Inject 8 Units into the skin See admin instructions. Back up for pump.   isosorbide mononitrate 30 MG 24 hr tablet Commonly known as: IMDUR Take 30 mg  tablet as needed for Chest pain spasm episodes for 3 days and the stop   Lancets 30G Misc 1 Device by Does not apply route 3 (three) times daily.   losartan 100 MG tablet Commonly known as: COZAAR Take 1 tablet (100 mg total) by mouth daily.   multivitamin with minerals Tabs tablet Take 1 tablet by mouth daily.   Omnipod DASH Pods (Gen 4) Misc Inject 1 Dose into the skin continuous. Using pump - depends on carb intake.   pantoprazole 40 MG tablet Commonly known as: Protonix Take 1 tablet (40 mg total) by mouth 2 (two) times daily.   predniSONE 10 MG tablet Commonly known as: DELTASONE Take 4 tablets (40 mg total) by mouth daily with breakfast for 7 days, THEN 3 tablets (30 mg total) daily with breakfast for 7 days, THEN 2 tablets (20 mg total) daily with breakfast for 7 days, THEN 1 tablet (10 mg total) daily with breakfast for 7 days, THEN 0.5 tablets (5 mg total) daily with breakfast for 7 days, THEN 0.5 tablets (5 mg total) every Monday, Wednesday, and Friday for 14 days. Start taking on: April 04, 2021   SAFETY-LOK TB SYRINGE 27GX.5" 27G X 1/2" 1 ML Misc Generic drug: TUBERCULIN SYR 1CC/27GX1/2"   sulfamethoxazole-trimethoprim 800-160 MG tablet Commonly known as: BACTRIM DS Take 1 tablet by mouth daily.   Victoza 18  MG/3ML Sopn Generic drug: liraglutide INJECT 1.8 MG UNDER THE SKIN ONCE DAILY         OBJECTIVE:   Vital Signs: Ht 5\' 3"  (1.6 m)   Wt 232 lb 3.2 oz (105.3 kg)   LMP  (LMP Unknown)   BMI 41.13 kg/m     Wt Readings from Last 3 Encounters:  04/05/21 235 lb (106.6 kg)  04/03/21 229 lb 4.5 oz (104 kg)  03/20/21 233 lb (105.7 kg)     Exam: General: Pt appears well and is in NAD  Lungs: Clear with good BS bilat with no rales, rhonchi, or wheezes  Heart: RRR  Extremities: No pretibial edema  Neuro: MS is good with appropriate affect, pt is alert and Ox3       DM foot exam: 01/30/2021 The skin of the feet is intact without sores or ulcerations. The pedal pulses are 2+ on right and 2+ on left. The sensation is intact to a screening 5.07, 10 gram monofilament bilaterally    DATA REVIEWED:  Lab Results  Component Value Date   HGBA1C 8.0 (A) 01/30/2021   HGBA1C 8.5 (  A) 09/23/2020   HGBA1C 8.6 (A) 06/17/2020   Lab Results  Component Value Date   MICROALBUR 2.3 (H) 01/30/2021   LDLCALC 44 08/22/2020   CREATININE 1.37 (H) 04/05/2021   Lab Results  Component Value Date   MICRALBCREAT 1.4 01/30/2021     Lab Results  Component Value Date   CHOL 97 08/22/2020   HDL 38.80 (L) 08/22/2020   LDLCALC 44 08/22/2020   LDLDIRECT 131 (H) 09/02/2013   TRIG 74.0 08/22/2020   CHOLHDL 3 08/22/2020       Results for [name] "PAT" (MRN [id number]) as of 01/31/2021 11:34  Ref. Range 01/30/2021 15:46  Creatinine,U Latest Units: mg/dL 170.0  Microalb, Ur Latest Ref Range: 0.0 - 1.9 mg/dL 2.3 (H)  MICROALB/CREAT RATIO Latest Ref Range: 0.0 - 30.0 mg/g 1.4    ASSESSMENT / PLAN / RECOMMENDATIONS:   1) Type 2 Diabetes Mellitus, Poorly controlled, With retinopathy and macrovascular complications - Most recent A1c of 8.0 %. Goal A1c <7.0 %.     - She is currently on prednisone for MTX pneumonitis, she has mainly hyperglycemia as evident by CGM download, with rare  hypoglycemia. She continues NOT to bolus for CHO intake this has been a trend since being on the pump , we again emphasized the importance of blouses, hence the severe glucose excrusions.  - I will reduce her basal rate due to tigt BG's and she promised to enter her CHO from now on  - She is interested in upgrading to [facility] but will check withour CDE as my understanding this is mainly for type 1 and the pt is type 2.   MEDICATIONS: Continue Victoza 1.8 mg daily Humalog per pump  Pump    Omni pod Settings   Insulin type    Humalog   Basal rate       0000 2.00              I:C ratio       0000-0600  1:10                   Sensitivity       0000  20      Goal       0000  100- 110         EDUCATION / INSTRUCTIONS: BG monitoring instructions: Patient is instructed to check her blood sugars 3 times a day, fasting , supper and bedtime. Call [facility] if: BG persistently < 70  I reviewed the Rule of 15 for the treatment of hypoglycemia in detail with the patient. Literature supplied.   F/U in 8 week   Signed electronically by: Mack Guise, MD  [facility]  [address] [place], [place] [address] Phone: [phone] FAX: [phone]   CC: [name], [address] [place] [address] Phone: [phone]  Fax: [phone]  Return to Endocrinology clinic as below: Future Appointments  Date Time Provider [place]  04/07/2021  3:20 PM Preston Weill, Melanie Crazier, MD [facility] None  05/18/2021 11:15 AM [name], PA [facility]  08/09/2021  3:20 PM Aariz Maish, Melanie Crazier, MD [facility] None  08/23/2021  2:00 PM Copland, Gay Filler, MD [facility]

## 2021-04-09 ENCOUNTER — Telehealth: Payer: Self-pay | Admitting: Internal Medicine

## 2021-04-09 ENCOUNTER — Encounter: Payer: Self-pay | Admitting: Family Medicine

## 2021-04-09 ENCOUNTER — Encounter: Payer: Self-pay | Admitting: Internal Medicine

## 2021-04-09 MED ORDER — TRAZODONE HCL 50 MG PO TABS: 25.0000 mg | ORAL_TABLET | Freq: Every evening | ORAL | 3 refills | Status: DC | PRN

## 2021-04-09 NOTE — Telephone Encounter (Signed)
Triage  [name] is on seteroid taper. Needs followup as hosital followup with app In < 2 weeks and then in sept 2022 with MR   Thanks  MR

## 2021-04-10 ENCOUNTER — Encounter: Payer: Self-pay | Admitting: Family Medicine

## 2021-04-10 NOTE — Telephone Encounter (Signed)
ATC LVMTCB x1. If pt calls back, front staff please schedule hospital f/u with NP the week of 04/24/21. Recall placed for Sept 2022 MR appt.

## 2021-04-10 NOTE — Telephone Encounter (Signed)
Pt is already scheduled for appt with Geraldo Pitter on 04/26/21. Nothing further needed at this time.

## 2021-04-11 NOTE — Telephone Encounter (Signed)
The G6 has an automated inserter, and the G5 needs to be injected.  Also the G6 has a better mard--more accurate in the lower and high ranges.

## 2021-04-12 ENCOUNTER — Other Ambulatory Visit: Payer: Self-pay | Admitting: Family Medicine

## 2021-04-12 DIAGNOSIS — J679 Hypersensitivity pneumonitis due to unspecified organic dust: Secondary | ICD-10-CM | POA: Diagnosis not present

## 2021-04-12 DIAGNOSIS — E1165 Type 2 diabetes mellitus with hyperglycemia: Secondary | ICD-10-CM | POA: Diagnosis not present

## 2021-04-12 DIAGNOSIS — K253 Acute gastric ulcer without hemorrhage or perforation: Secondary | ICD-10-CM | POA: Diagnosis not present

## 2021-04-12 DIAGNOSIS — J9601 Acute respiratory failure with hypoxia: Secondary | ICD-10-CM | POA: Diagnosis not present

## 2021-04-12 DIAGNOSIS — I5032 Chronic diastolic (congestive) heart failure: Secondary | ICD-10-CM | POA: Diagnosis not present

## 2021-04-12 DIAGNOSIS — D649 Anemia, unspecified: Secondary | ICD-10-CM | POA: Diagnosis not present

## 2021-04-12 DIAGNOSIS — M6281 Muscle weakness (generalized): Secondary | ICD-10-CM | POA: Diagnosis not present

## 2021-04-12 DIAGNOSIS — I348 Other nonrheumatic mitral valve disorders: Secondary | ICD-10-CM | POA: Diagnosis not present

## 2021-04-12 DIAGNOSIS — Z794 Long term (current) use of insulin: Secondary | ICD-10-CM

## 2021-04-12 DIAGNOSIS — I110 Hypertensive heart disease with heart failure: Secondary | ICD-10-CM | POA: Diagnosis not present

## 2021-04-12 DIAGNOSIS — I480 Paroxysmal atrial fibrillation: Secondary | ICD-10-CM | POA: Diagnosis not present

## 2021-04-14 DIAGNOSIS — I480 Paroxysmal atrial fibrillation: Secondary | ICD-10-CM | POA: Diagnosis not present

## 2021-04-14 DIAGNOSIS — I348 Other nonrheumatic mitral valve disorders: Secondary | ICD-10-CM | POA: Diagnosis not present

## 2021-04-14 DIAGNOSIS — J9601 Acute respiratory failure with hypoxia: Secondary | ICD-10-CM | POA: Diagnosis not present

## 2021-04-14 DIAGNOSIS — K253 Acute gastric ulcer without hemorrhage or perforation: Secondary | ICD-10-CM | POA: Diagnosis not present

## 2021-04-14 DIAGNOSIS — D649 Anemia, unspecified: Secondary | ICD-10-CM | POA: Diagnosis not present

## 2021-04-14 DIAGNOSIS — I110 Hypertensive heart disease with heart failure: Secondary | ICD-10-CM | POA: Diagnosis not present

## 2021-04-14 DIAGNOSIS — J679 Hypersensitivity pneumonitis due to unspecified organic dust: Secondary | ICD-10-CM | POA: Diagnosis not present

## 2021-04-14 DIAGNOSIS — I5032 Chronic diastolic (congestive) heart failure: Secondary | ICD-10-CM | POA: Diagnosis not present

## 2021-04-14 DIAGNOSIS — M6281 Muscle weakness (generalized): Secondary | ICD-10-CM | POA: Diagnosis not present

## 2021-04-14 DIAGNOSIS — E1165 Type 2 diabetes mellitus with hyperglycemia: Secondary | ICD-10-CM | POA: Diagnosis not present

## 2021-04-18 DIAGNOSIS — E1165 Type 2 diabetes mellitus with hyperglycemia: Secondary | ICD-10-CM | POA: Diagnosis not present

## 2021-04-18 DIAGNOSIS — I5032 Chronic diastolic (congestive) heart failure: Secondary | ICD-10-CM | POA: Diagnosis not present

## 2021-04-18 DIAGNOSIS — D649 Anemia, unspecified: Secondary | ICD-10-CM | POA: Diagnosis not present

## 2021-04-18 DIAGNOSIS — M6281 Muscle weakness (generalized): Secondary | ICD-10-CM | POA: Diagnosis not present

## 2021-04-18 DIAGNOSIS — K253 Acute gastric ulcer without hemorrhage or perforation: Secondary | ICD-10-CM | POA: Diagnosis not present

## 2021-04-18 DIAGNOSIS — J9601 Acute respiratory failure with hypoxia: Secondary | ICD-10-CM | POA: Diagnosis not present

## 2021-04-18 DIAGNOSIS — I480 Paroxysmal atrial fibrillation: Secondary | ICD-10-CM | POA: Diagnosis not present

## 2021-04-18 DIAGNOSIS — I110 Hypertensive heart disease with heart failure: Secondary | ICD-10-CM | POA: Diagnosis not present

## 2021-04-18 DIAGNOSIS — I348 Other nonrheumatic mitral valve disorders: Secondary | ICD-10-CM | POA: Diagnosis not present

## 2021-04-18 DIAGNOSIS — J679 Hypersensitivity pneumonitis due to unspecified organic dust: Secondary | ICD-10-CM | POA: Diagnosis not present

## 2021-04-19 ENCOUNTER — Telehealth: Payer: Self-pay

## 2021-04-19 NOTE — Telephone Encounter (Signed)
Porshia would like to know if you all have received a fax for Oxygen a Rx is needed.  CB number is needed: [phone]

## 2021-04-19 NOTE — Telephone Encounter (Signed)
Oxygen order has been faxed.   

## 2021-04-20 NOTE — Telephone Encounter (Signed)
Lynn [name] ([employer]) states she received the fax. However, she is looking for the form she faxed over today. She would like to know if you got it.   Call back # [phone]

## 2021-04-20 NOTE — Telephone Encounter (Signed)
I have faxed orders that were faxed today.

## 2021-04-21 NOTE — Progress Notes (Signed)
_0  ID: Lynn [name], female    DOB: 1956-01-23, 65 y.o.   MRN: [id number]  Chief Complaint  Patient presents with   Follow-up    06-09/22, She reports having trouble catching her breath at times. She has clear sputum at times.     Referring provider: [name], MD  HPI: 65 year old female, former smoker quit in May 2016 (11-pack-year history).  Past medical history significant for rheumatoid arthritis on methotrexate, hypertension, heart failure with preserved ejection fraction, type 2 diabetes, chronic anemia thalassemia. Former patient of Dr. Melvyn Novas, last seen in office on 01/14/2018.  04/02/21 [facility] consult -Dr. Chase Caller 65 year old woman with PMHx significant for HTN, HFpEF (Echo 03/2021 EF 55-60%), T2DM, chronic anemia 2/2 thalassemia, osteoarthritis (s/p bilateral TKA) and rheumatoid arthritis (on methotrexate) admitted to [facility] 03/23/21 with acute hypoxemic respiratory failure.   Patient was recently treated with doxycycline and azithromycin for presumed bronchitis. Febrile this admission to Tmax of 101.71F with dry cough. CTA Chest 6/7 demonstrating scattered diffuse areas of ground glass attenuation, negative for pulmonary emboli. COVID and influenza PCRs negative.   PCCM consulted for further evaluation/recommendations for respiratory failure and fevers.  Significant Hospital Events: Including procedures, antibiotic start and stop dates in addition to other pertinent events  Admitted 03/23/2021 6/11 with increasing oxygen needs to 5L 6/14 - Bronch BAL - 64% lymphs, 32% mono, Cytology without malig cells. Culture negative as of 6/16.  Started Solu-Medrol.  Gives history of methotrexate injection and also feather pillow exposure 6/16 - CT Chest completed with GGOs, negative for PE. Cardiac MRI demonstrating mitral annular mass c/w degenerative mitral annular calcification. Melena noted in PM, FOBT+. 6/17 - GI consulted for melena/Hgb drift. Plan for EGD  pending pulmonary clearance for procedure. Ceftriaxone started.  3 L oxygen need    04/26/2021 Patient presents today for hospital follow-up. She was admitted on 03/23/21 for acute hypoxic respiratory failure secondary to interstitial lung disease.  Patient underwent bronchoscopy on 6/14 showed predominant lymphocytosis greater than 60% on BAL, felt to be consistent with methotrexate hypersensitivity pneumonitis or feather pillow hypersensitivity pneumonitis or combination; rarely it can be ill IP from rheumatoid arthritis. CT chest on 03/30/2021 showed marked increase peribronchovascular groundglass and upper/mid lung zone with interstitial thickening and scattered collapse/consolidation. Suggestive of alternative diagnosis (not UIP).  Infectious disease was consulted on 03/31/2021 recommending Bactrim prophylactically if on prednisone greater than 20 mg/day for 2 months.  Today she is doing some better. She has an occasional dry cough, recently started coughing up some clear mucus.  Patient states that she never in fact had a feather pillow.  She has been encouraged to discuss alternative options to methotrexate with her rheumatologist.  She is currently on slow prednisone taper for the next 2 to 3 months started on 04/03/21 (40 mg/day x 2 weeks; 30 mg/day x 2 weeks; 20 mg/day x 2 weeks; 10 mg/day x 2 weeks; 5 mg/day x 2 weeks and then 5 mg Monday Wednesday Friday x2 weeks). She is taking Bactrim DS daily per ID. She is also taking Protonix twice a day. She is using 2L oxygen with exertion and at night.    Allergies  Allergen Reactions   Penicillins Hives    Childhood allergy Has patient had a PCN reaction causing immediate rash, facial/tongue/throat swelling, SOB or lightheadedness with hypotension: Yes Has patient had a PCN reaction causing severe rash involving mucus membranes or skin necrosis: No Has patient had a PCN reaction that required hospitalization  No Has patient had a PCN reaction occurring  within the last 10 years: No If all of the above answers are "NO", then may proceed with Cephalosporin use.   Metformin And Related Other (See Comments)    Diarrhea, bleeding   Farxiga [Dapagliflozin] Other (See Comments)    Dizzy and lethargic     Immunization History  Administered Date(s) Administered   Influenza Split 07/22/2012   Influenza, High Dose Seasonal PF 06/10/2019   Influenza,inj,Quad PF,6+ Mos 10/09/2014, 07/13/2015, 11/17/2016, 07/04/2017, 07/10/2018   Influenza-Unspecified 06/20/2020   Moderna Sars-Covid-2 Vaccination 11/27/2019, 12/26/2019, 06/22/2020, 12/30/2020   Pneumococcal Conjugate-13 03/23/2019   Pneumococcal Polysaccharide-23 08/22/2020   Pneumococcal-Unspecified 02/12/2010   Tdap 02/12/2010, 10/17/2016   Zoster Recombinat (Shingrix) 02/20/2021    Past Medical History:  Diagnosis Date   Anxiety    Arthritis    "knees, wrists, back, elbows" (07/03/2017)   Clotting disorder (HCC)    blood clot in eye 2016   Coronary artery disease, non-occlusive    a. 11/2016 NSTEMI/Cath: LM nl, LAD 40p, 76m, D1/2 small, LCX 40ost, OM2/3 nl/small, RCA 35 ost/mid, RPDA/RPL small/nl.   Coronary vasospasm (HCC)    Depression    Diastolic dysfunction    a. 11/2016 Echo: EF 55-60%, gr1 DD, sev calcified MV annulus, mildly dil LA.   Eye hemorrhage 01/2015   "right; resolved" (07/03/2017)   Headache    "monthly" (07/03/2017)   Heart murmur    History of blood transfusion    "when I had an ectopic pregnancy"   History of stomach ulcers    Hyperlipidemia    Hypertension    Microcytic anemia    Mitral stenosis    OSA on CPAP    setting is unknown   PAT (paroxysmal atrial tachycardia) (HCC)    Pneumonia    "several times" (07/03/2017)   Premature atrial contractions    PVC's (premature ventricular contractions)    Syncope 07/03/2017 X 2   called seizures but no medications   Thalassemia    "my cells are sickle cell shaped but I don't have sickle cell anemia"  (07/03/2017)   Type II diabetes mellitus (HCC)     Tobacco History: Social History   Tobacco Use  Smoking Status Former   Packs/day: 0.25   Years: 44.00   Pack years: 11.00   Types: Cigarettes   Quit date: 02/17/2015   Years since quitting: 6.1  Smokeless Tobacco Never   Counseling given: Not Answered   Outpatient Medications Prior to Visit  Medication Sig Dispense Refill   acetaminophen (TYLENOL) 500 MG tablet Take 500 mg by mouth every 6 (six) hours as needed for fever.     albuterol (VENTOLIN HFA) 108 (90 Base) MCG/ACT inhaler Inhale 2 puffs into the lungs every 6 (six) hours as needed for wheezing or shortness of breath. 8 g 2   atorvastatin (LIPITOR) 80 MG tablet TAKE 1 TABLET(80 MG) BY MOUTH DAILY AT 6 PM 90 tablet 3   cetirizine (ZYRTEC) 10 MG tablet Take 10 mg by mouth daily as needed for allergies.     Continuous Blood Gluc Sensor (DEXCOM G6 SENSOR) MISC 1 Device by Does not apply route as directed. 3 each 6   Continuous Blood Gluc Transmit (DEXCOM G6 TRANSMITTER) MISC 1 Device by Does not apply route as directed. 1 each 3   fluticasone (FLOVENT DISKUS) 50 MCG/BLIST diskus inhaler Inhale 1 puff by mouth  into the lungs 2 (two) times daily. 60 each 3   folic acid (FOLVITE) 1  MG tablet Take 2 tablets (2 mg total) by mouth daily. 180 tablet 3   glucose blood (CONTOUR NEXT TEST) test strip 3x daily 300 each 11   Insulin Disposable Pump (OMNIPOD DASH PODS, GEN 4,) MISC Inject 1 Dose into the skin continuous. Using pump - depends on carb intake.     insulin glargine (LANTUS SOLOSTAR) 100 UNIT/ML Solostar Pen INJECT 54 UNITS INTO THE SKIN DAILY. 15 mL 11   insulin lispro (HUMALOG) 100 UNIT/ML injection Inject 8 Units into the skin See admin instructions. Back up for pump.     Insulin Pen Needle (B-D UF III MINI PEN NEEDLES) 31G X 5 MM MISC USE DAILY WITH VICTOZA AND LANTUS. 400 each 1   Insulin Pen Needle 31G X 5 MM MISC 1 Device by Does not apply route 3 (three) times daily. 150  each 11   isosorbide mononitrate (IMDUR) 30 MG 24 hr tablet Take 30 mg  tablet as needed for Chest pain spasm episodes for 3 days and the stop (Patient taking differently: Take 30 mg  tablet as needed for Chest pain spasm episodes for 3 days and the stop) 20 tablet 7   Lancets 30G MISC 1 Device by Does not apply route 3 (three) times daily. 300 each 9   Multiple Vitamin (MULTIVITAMIN WITH MINERALS) TABS tablet Take 1 tablet by mouth daily.     pantoprazole (PROTONIX) 40 MG tablet Take 1 tablet (40 mg total) by mouth 2 (two) times daily. 180 tablet 0   SAFETY-LOK TB SYRINGE 27GX.5" 27G X 1/2" 1 ML MISC      traZODone (DESYREL) 50 MG tablet Take 0.5-1 tablets (25-50 mg total) by mouth at bedtime as needed for sleep. 30 tablet 3   umeclidinium-vilanterol (ANORO ELLIPTA) 62.5-25 MCG/INH AEPB Inhale 1 puff into the lungs daily. 1 each 1   VICTOZA 18 MG/3ML SOPN INJECT 1.8 MG UNDER THE SKIN ONCE DAILY 15 mL 3   predniSONE (DELTASONE) 10 MG tablet Take 4 tablets (40 mg total) by mouth daily with breakfast for 7 days, THEN 3 tablets (30 mg total) daily with breakfast for 7 days, THEN 2 tablets (20 mg total) daily with breakfast for 7 days, THEN 1 tablet (10 mg total) daily with breakfast for 7 days, THEN 0.5 tablets (5 mg total) daily with breakfast for 7 days, THEN 0.5 tablets (5 mg total) every Monday, Wednesday, and Friday for 14 days. 77 tablet 0   diltiazem (CARDIZEM CD) 240 MG 24 hr capsule Take 1 capsule (240 mg total) by mouth daily. 90 capsule 3   losartan (COZAAR) 100 MG tablet Take 1 tablet (100 mg total) by mouth daily. 90 tablet 3   sulfamethoxazole-trimethoprim (BACTRIM DS) 800-160 MG tablet Take 1 tablet by mouth daily. (Patient not taking: Reported on 04/26/2021) 21 tablet 0   No facility-administered medications prior to visit.      Review of Systems  Review of Systems  Constitutional: Negative.   HENT: Negative.    Respiratory:  Positive for cough and chest tightness.     Physical  Exam  BP 116/70 (BP Location: Left Arm, Patient Position: Sitting, Cuff Size: Normal)   Pulse 73   Temp 98.3 F (36.8 C) (Oral)   Ht _0  (1.6 m)   Wt 230 lb (104.3 kg)   LMP  (LMP Unknown)   SpO2 99% Comment: 3 Liters  BMI 40.74 kg/m  Physical Exam Constitutional:      Appearance: Normal appearance.  HENT:  Head: Normocephalic and atraumatic.  Cardiovascular:     Rate and Rhythm: Normal rate and regular rhythm.  Pulmonary:     Effort: Pulmonary effort is normal.     Breath sounds: Normal breath sounds.     Comments: CTA Musculoskeletal:        General: Normal range of motion.  Skin:    General: Skin is warm and dry.  Neurological:     General: No focal deficit present.     Mental Status: She is alert and oriented to person, place, and time. Mental status is at baseline.  Psychiatric:        Mood and Affect: Mood normal.        Behavior: Behavior normal.        Thought Content: Thought content normal.        Judgment: Judgment normal.     Lab Results:  CBC    Component Value Date/Time   WBC 17.8 (H) 04/05/2021 1138   RBC 5.15 (H) 04/05/2021 1138   HGB 9.5 (L) 04/05/2021 1138   HGB 9.7 (L) 04/03/2021 0653   HGB 9.0 (L) 02/26/2017 1501   HCT 31.6 (L) 04/05/2021 1138   HCT 29.1 (L) 02/26/2017 1501   PLT 336.0 04/05/2021 1138   PLT 294 02/26/2017 1501   MCV 61.3 Repeated and verified X2. (L) 04/05/2021 1138   MCV 61 (L) 02/26/2017 1501   MCH 18.8 (L) 04/03/2021 0653   MCHC 30.0 04/05/2021 1138   RDW 19.4 (H) 04/05/2021 1138   RDW 17.1 (H) 02/26/2017 1501   LYMPHSABS 4.2 (H) 04/01/2021 0837   LYMPHSABS 2.5 02/26/2017 1501   MONOABS 1.9 (H) 04/01/2021 0837   EOSABS 0.0 04/01/2021 0837   EOSABS 0.2 02/26/2017 1501   BASOSABS 0.0 04/01/2021 0837   BASOSABS 0.0 02/26/2017 1501    BMET    Component Value Date/Time   NA 136 04/05/2021 1138   NA 139 02/22/2021 0000   K 4.4 04/05/2021 1138   CL 99 04/05/2021 1138   CO2 28 04/05/2021 1138   GLUCOSE 121  (H) 04/05/2021 1138   BUN 22 04/05/2021 1138   BUN 16 02/22/2021 0000   CREATININE 1.37 (H) 04/05/2021 1138   CREATININE 0.95 01/03/2016 1118   CALCIUM 8.8 04/05/2021 1138   GFRNONAA 55 (L) 04/03/2021 0653   GFRNONAA 65 01/03/2016 1118   GFRAA 56 08/25/2020 0000   GFRAA 75 01/03/2016 1118    BNP    Component Value Date/Time   BNP 152.5 (H) 03/26/2021 1457    ProBNP    Component Value Date/Time   PROBNP 57.0 03/20/2021 1220    Imaging: DG Chest 2 View  Result Date: 04/27/2021 CLINICAL DATA:  Interstitial lung disease. EXAM: CHEST - 2 VIEW COMPARISON:  CT chest dated March 30, 2021. Chest x-ray dated March 26, 2021. FINDINGS: Unchanged mild cardiomegaly. Hazy opacities in both lungs appear resolved by x-ray. Mild scarring in both upper lobes. No focal consolidation, pleural effusion, or pneumothorax. No acute osseous abnormality. IMPRESSION: 1. Resolved hazy airspace disease in both lungs. Electronically Signed   By: Titus Dubin M.D.   On: 04/27/2021 09:26   CT Chest High Resolution  Result Date: 03/30/2021 CLINICAL DATA:  Interstitial lung disease, lymphocytosis on bronchoalveolar lavage. Acute hypoxic respiratory failure. EXAM: CT CHEST WITHOUT CONTRAST TECHNIQUE: Multidetector CT imaging of the chest was performed following the standard protocol without intravenous contrast. High resolution imaging of the lungs, as well as inspiratory and expiratory imaging, was performed. COMPARISON:  03/21/2021.  FINDINGS: Cardiovascular: Atherosclerotic calcification of the aorta, aortic valve and coronary arteries. Pulmonic trunk and heart are enlarged. No pericardial effusion. Mediastinum/Nodes: Numerous mediastinal lymph nodes, none of which are enlarged by CT size criteria. Hilar regions are difficult to evaluate without IV contrast. No axillary adenopathy. Esophagus is grossly unremarkable. Lungs/Pleura: Interval development of worsening peribronchovascular ground-glass, interstitial  thickening and scattered collapse/consolidation, upper and midlung zone predominant. No definite air trapping. There may be trace right pleural fluid. Airway is unremarkable. Upper Abdomen: Visualized portions of the liver, adrenal glands, kidneys, spleen, pancreas, stomach and bowel are grossly unremarkable. Upper abdominal lymph nodes are not enlarged by CT size criteria. Musculoskeletal: Degenerative changes in the spine. IMPRESSION: 1. Marked increase in upper/midlung zone predominant peribronchovascular ground-glass, interstitial thickening and scattered collapse/consolidation when compared with 03/21/2021. Findings are likely due to an atypical/viral pneumonia, including due to COVID-19. Acute drug toxicity is not excluded. Findings are suggestive of an alternative diagnosis (not UIP) per consensus guidelines: Diagnosis of Idiopathic Pulmonary Fibrosis: An Official ATS/ERS/JRS/ALAT Clinical Practice Guideline. [place], Iss 5, [phone], Jun 15 2017. 2. Trace right pleural fluid. 3. Aortic atherosclerosis (ICD10-I70.0). Coronary artery calcification. 4. Enlarged pulmonic trunk, indicative of pulmonary arterial hypertension. Electronically Signed   By: Lorin Picket M.D.   On: 03/30/2021 14:14   MR CARDIAC MORPHOLOGY W WO CONTRAST  Result Date: 03/30/2021 CLINICAL DATA:  Cardiac mass EXAM: CARDIAC MRI TECHNIQUE: The patient was scanned on a 1.5 Tesla GE magnet. A dedicated cardiac coil was used. Functional imaging was done using Fiesta sequences. 2,3, and 4 chamber views were done to assess for RWMA's. Modified Simpson's rule using a short axis stack was used to calculate an ejection fraction on a dedicated work Conservation officer, nature. The patient received 10 cc of Gadavist. After 10 minutes inversion recovery sequences were used to assess for infiltration and scar tissue. CONTRAST:  10 cc  of Gadavist FINDINGS: 1. Normal left ventricular size, thickness and systolic  function (LVEF = 53%). There are no regional wall motion abnormalities. There is no late gadolinium enhancement in the left ventricular myocardium. There is a 21 x 19 mm mass at the level of the mitral annulus, posterior lateral segment. The mass was hypointense on T1, T2-weighted and short tau inversion recovery sequences. The mass did not show any contrast uptake or late gadolinium enhancement highly suggestive of degenerative mitral annular calcification. LVEDV: 146 ml LVESV: 68 ml SV: 77 ml CO: 5L/min Myocardial mass: 113g 2. Normal right ventricular size, thickness and systolic function (RVEF = 48%). There are no regional wall motion abnormalities. 3.  Normal left and right atrial size. 4. Normal size of the aortic root, ascending aorta and pulmonary artery. 5.  Mild mitral regurgitation. 6.  Normal pericardium.  No pericardial effusion. IMPRESSION: 1.  Normal right and left ventricular size and function, LVEF 53%. 2. Posterolateral mitral annulus mass consistent with degenerative mitral annular calcification (hypointense on T1, T2 and STIR sequences, no LGE). 3. I reviewed today's High resolution chest CT which confirmed calcification of mitral annular mass as above. 4.  No scar or late gadolinium enhancement noted on LV myocardium. Kate Sable Electronically Signed   By: Kate Sable M.D.   On: 03/30/2021 16:09     Assessment & Plan:   ILD (interstitial lung disease) ([place]) - Admitted 03/23/21 for acute hypoxic respiratory failure, underwent bronchoscopy on 03/28/21 that showed predominant lymphocytosis grater than >60% on BAL. CT chest on 03/30/21 suggestive  of UIP.  Findings consistent with methotrexate hypersensitivity pneumonitis, rarely could it be ILP from RA. She was encouraged to discuss alternative options to methotrexate with her rheumatologist  - She continues to improve clinically, occasional dry cough- rarely productive. She never had a feather pillow. She continues on a slow  prednisone taper, she was discharged on the wrong taper, she was tapering every 7 days as opposed to the recommended 14 days. - We have instructed her to resume Prednisone 16m 30 mg/day x 2 weeks; 20 mg/day x 2 weeks; 10 mg/day x 2 weeks; 5 mg/day x 2 weeks and then 5 mg Monday Wednesday Friday x2 weeks).  - Continues Bactrim DS daily per ID - She will need repeat QuantiFERON gold, hypersensitivity pneumonitis panel off oral steroids  - Follow-up in September with Dr. RChase Caller Chronic respiratory failure with hypoxia ([facility] - Patient is using 2L supplemental oxygen as needed with exertion and at night. Lowest her O2 dropped was 94% RA p 3 laps today. She complained of shortness fo breath without using oxygen and had to stop after 2 laps   Recommendations: Adjusting your prednisone taper  Continue Bactrim DS daily  Continue Protonix twice a day  Use incentive spirometer 10x/hour a couple times a day  Continue supplemental oxygen 2L on exertion and at night to maintain >88-90% Encouraged to discuss alternative options to methotrexate with her rheumatologist. We will need to repeat lab work once you come off oral steriods   Orders: Ambulatory walk test today on RA and titrate O2 as needed CXR today (ordered) HRCT in 6-8 weeks (ordered)   Follow-up: September with Dr. RChase Calleronly (30 mins if available)  40 min spent on case, > 50% face to face with patient   EMartyn Ehrich NP 04/28/2021

## 2021-04-21 NOTE — Progress Notes (Signed)
Ok sounds good, i'll address when I see her in office. Thank you

## 2021-04-24 ENCOUNTER — Encounter: Payer: Self-pay | Admitting: Family Medicine

## 2021-04-25 DIAGNOSIS — D649 Anemia, unspecified: Secondary | ICD-10-CM | POA: Diagnosis not present

## 2021-04-25 DIAGNOSIS — I5032 Chronic diastolic (congestive) heart failure: Secondary | ICD-10-CM | POA: Diagnosis not present

## 2021-04-25 DIAGNOSIS — I480 Paroxysmal atrial fibrillation: Secondary | ICD-10-CM | POA: Diagnosis not present

## 2021-04-25 DIAGNOSIS — K253 Acute gastric ulcer without hemorrhage or perforation: Secondary | ICD-10-CM | POA: Diagnosis not present

## 2021-04-25 DIAGNOSIS — I348 Other nonrheumatic mitral valve disorders: Secondary | ICD-10-CM | POA: Diagnosis not present

## 2021-04-25 DIAGNOSIS — E1165 Type 2 diabetes mellitus with hyperglycemia: Secondary | ICD-10-CM | POA: Diagnosis not present

## 2021-04-25 DIAGNOSIS — I110 Hypertensive heart disease with heart failure: Secondary | ICD-10-CM | POA: Diagnosis not present

## 2021-04-25 DIAGNOSIS — J679 Hypersensitivity pneumonitis due to unspecified organic dust: Secondary | ICD-10-CM | POA: Diagnosis not present

## 2021-04-25 DIAGNOSIS — M6281 Muscle weakness (generalized): Secondary | ICD-10-CM | POA: Diagnosis not present

## 2021-04-25 DIAGNOSIS — J9601 Acute respiratory failure with hypoxia: Secondary | ICD-10-CM | POA: Diagnosis not present

## 2021-04-26 ENCOUNTER — Other Ambulatory Visit: Payer: Self-pay

## 2021-04-26 ENCOUNTER — Ambulatory Visit (INDEPENDENT_AMBULATORY_CARE_PROVIDER_SITE_OTHER): Payer: Medicare Other

## 2021-04-26 ENCOUNTER — Ambulatory Visit: Payer: Medicare Other | Admitting: Primary Care

## 2021-04-26 ENCOUNTER — Encounter: Payer: Self-pay | Admitting: Primary Care

## 2021-04-26 VITALS — BP 116/70 | HR 73 | Temp 98.3°F | Ht 63.0 in | Wt 230.0 lb

## 2021-04-26 DIAGNOSIS — J849 Interstitial pulmonary disease, unspecified: Secondary | ICD-10-CM

## 2021-04-26 DIAGNOSIS — I517 Cardiomegaly: Secondary | ICD-10-CM | POA: Diagnosis not present

## 2021-04-26 DIAGNOSIS — J9611 Chronic respiratory failure with hypoxia: Secondary | ICD-10-CM | POA: Diagnosis not present

## 2021-04-26 LAB — FUNGUS CULTURE WITH STAIN

## 2021-04-26 LAB — FUNGAL ORGANISM REFLEX

## 2021-04-26 LAB — FUNGUS CULTURE RESULT

## 2021-04-26 MED ORDER — PREDNISONE 10 MG PO TABS: ORAL_TABLET | ORAL | 0 refills | Status: AC

## 2021-04-26 NOTE — Patient Instructions (Addendum)
Prednisone taper for the next 2 to 3 months started on 04/03/21  (40 mg/day x 2 weeks; 30 mg/day x 2 weeks; 20 mg/day x 2 weeks; 10 mg/day x 2 weeks; 5 mg/day x 2 weeks and then 5 mg Monday Wednesday Friday x2 weeks).   If this is not the current prednisone taper you are on recommend you go back up to 30mg  and stay on this for 2 week and continue taper as directed above   Recommendations: Adjusting your prednisone taper (see above) Continue Bactrim DS daily  Continue Protonix twice a day  Use incentive spirometer 10x/hour a couple times a day  Contineu supplemental oxygen 2L on exertion and at night to maintain >88-90% Encouraged to discuss alternative options to methotrexate with her rheumatologist. We will need to repeat lab work once you come off oral steriods   Orders: Ambulatory walk test today on RA and titrate O2 as needed CXR today (ordered) HRCT in 6-8 weeks (ordered)   Follow-up: September with Dr. Chase Caller only (30 mins if available)

## 2021-04-27 DIAGNOSIS — I5032 Chronic diastolic (congestive) heart failure: Secondary | ICD-10-CM | POA: Diagnosis not present

## 2021-04-27 DIAGNOSIS — J679 Hypersensitivity pneumonitis due to unspecified organic dust: Secondary | ICD-10-CM | POA: Diagnosis not present

## 2021-04-27 DIAGNOSIS — E1165 Type 2 diabetes mellitus with hyperglycemia: Secondary | ICD-10-CM | POA: Diagnosis not present

## 2021-04-27 DIAGNOSIS — K253 Acute gastric ulcer without hemorrhage or perforation: Secondary | ICD-10-CM | POA: Diagnosis not present

## 2021-04-27 DIAGNOSIS — I348 Other nonrheumatic mitral valve disorders: Secondary | ICD-10-CM | POA: Diagnosis not present

## 2021-04-27 DIAGNOSIS — I110 Hypertensive heart disease with heart failure: Secondary | ICD-10-CM | POA: Diagnosis not present

## 2021-04-27 DIAGNOSIS — I480 Paroxysmal atrial fibrillation: Secondary | ICD-10-CM | POA: Diagnosis not present

## 2021-04-27 DIAGNOSIS — D649 Anemia, unspecified: Secondary | ICD-10-CM | POA: Diagnosis not present

## 2021-04-27 DIAGNOSIS — J9601 Acute respiratory failure with hypoxia: Secondary | ICD-10-CM | POA: Diagnosis not present

## 2021-04-27 DIAGNOSIS — M6281 Muscle weakness (generalized): Secondary | ICD-10-CM | POA: Diagnosis not present

## 2021-04-28 ENCOUNTER — Encounter: Payer: Self-pay | Admitting: Primary Care

## 2021-04-28 ENCOUNTER — Encounter: Payer: Self-pay | Admitting: Family Medicine

## 2021-04-28 ENCOUNTER — Telehealth: Payer: Self-pay

## 2021-04-28 NOTE — Telephone Encounter (Signed)
Called pt to clarify message. See telephone encounter from 04/28/21.

## 2021-04-28 NOTE — Telephone Encounter (Signed)
ATC to clarify my chart message as to how much prednisone she is currently taking. Left message to call office back.

## 2021-04-28 NOTE — Assessment & Plan Note (Signed)
-   Admitted 03/23/21 for acute hypoxic respiratory failure, underwent bronchoscopy on 03/28/21 that showed predominant lymphocytosis grater than >60% on BAL. CT chest on 03/30/21 suggestive of UIP.  Findings consistent with methotrexate hypersensitivity pneumonitis, rarely could it be ILP from RA. She was encouraged to discuss alternative options to methotrexate with her rheumatologist  - She continues to improve clinically, occasional dry cough- rarely productive. She never had a feather pillow. She continues on a slow prednisone taper, she was discharged on the wrong taper, she was tapering every 7 days as opposed to the recommended 14 days. - We have instructed her to resume Prednisone 40m 30 mg/day x 2 weeks; 20 mg/day x 2 weeks; 10 mg/day x 2 weeks; 5 mg/day x 2 weeks and then 5 mg Monday Wednesday Friday x2 weeks).  - Continues Bactrim DS daily per ID - She will need repeat QuantiFERON gold, hypersensitivity pneumonitis panel off oral steroids  - Follow-up in September with Dr. RChase Caller

## 2021-04-28 NOTE — Assessment & Plan Note (Addendum)
-   Patient is using 2L supplemental oxygen as needed with exertion and at night. Lowest her O2 dropped was 94% RA p 3 laps today. She complained of shortness fo breath without using oxygen and had to stop after 2 laps

## 2021-05-02 ENCOUNTER — Other Ambulatory Visit: Payer: Self-pay | Admitting: Family Medicine

## 2021-05-02 DIAGNOSIS — M6281 Muscle weakness (generalized): Secondary | ICD-10-CM | POA: Diagnosis not present

## 2021-05-02 DIAGNOSIS — J679 Hypersensitivity pneumonitis due to unspecified organic dust: Secondary | ICD-10-CM | POA: Diagnosis not present

## 2021-05-02 DIAGNOSIS — I480 Paroxysmal atrial fibrillation: Secondary | ICD-10-CM | POA: Diagnosis not present

## 2021-05-02 DIAGNOSIS — J9601 Acute respiratory failure with hypoxia: Secondary | ICD-10-CM | POA: Diagnosis not present

## 2021-05-02 DIAGNOSIS — K253 Acute gastric ulcer without hemorrhage or perforation: Secondary | ICD-10-CM | POA: Diagnosis not present

## 2021-05-02 DIAGNOSIS — I5032 Chronic diastolic (congestive) heart failure: Secondary | ICD-10-CM | POA: Diagnosis not present

## 2021-05-02 DIAGNOSIS — I110 Hypertensive heart disease with heart failure: Secondary | ICD-10-CM | POA: Diagnosis not present

## 2021-05-02 DIAGNOSIS — I348 Other nonrheumatic mitral valve disorders: Secondary | ICD-10-CM | POA: Diagnosis not present

## 2021-05-02 DIAGNOSIS — D649 Anemia, unspecified: Secondary | ICD-10-CM | POA: Diagnosis not present

## 2021-05-02 DIAGNOSIS — E1165 Type 2 diabetes mellitus with hyperglycemia: Secondary | ICD-10-CM | POA: Diagnosis not present

## 2021-05-08 ENCOUNTER — Other Ambulatory Visit: Payer: Self-pay | Admitting: Family Medicine

## 2021-05-08 DIAGNOSIS — Z1231 Encounter for screening mammogram for malignant neoplasm of breast: Secondary | ICD-10-CM

## 2021-05-11 LAB — ACID FAST CULTURE WITH REFLEXED SENSITIVITIES (MYCOBACTERIA): Acid Fast Culture: NEGATIVE

## 2021-05-17 NOTE — Progress Notes (Signed)
Cardiology Office Note:    Date:  05/18/2021   ID:  Lynn [name], DOB April 05, 1956, MRN [id number]  PCP:  [name], MD  Cardiologist:  Glenetta Hew, MD   Referring MD: [name], MD   Chief Complaint  Patient presents with   Follow-up    Palpitations, PAF, need for [facility]    History of Present Illness:    Lynn [name] is a 66 y.o. female with a hx of RA, CAD, presumed coronary vasospasm, mitral stenosis, anxiety, stomach ulcers, HTN, HLD, former tobacco smoker, microcytic anemia, OSA, CKD stage IIIa, seizures, thalassemia, DM2, and PAT.   Hx of CP with heart cath 11/2016 showing moderate-heavy coronary calcification, 40-50% LAD, 30-50% proximal LAD, and inferolateral calcified mass felt to reflect MAC. She had runs of SVT. Follow up event monitor showed mostly NSR, multifocal PVCs with couplets and bigeminy, and 7 PAT runs. She has been treated for coronary spasm with diltiazem and imdur. Echo 07/2017 confirmed heavy posterior MAC with moderate stenosis.   She was hospitalized at [facility] 03/27/21 with daily fevers x 3 + weeks with associated symptoms and 10 lb weight loss, along with atypical chest pain. She was trying to manage OP, but presented due to worsening symptoms, found to be in respiratory distress requiring supplemental O2. CTA negative for PE. COVID and RVP negative. Echo with LVEF 60-65%, moderate LVH, grade 2 DD, MAC with moderate MS, and mass on posterior annulus.  EKG also with newly diagnosed Afib x 3 hrs then converted to NSR. Brain MRI questioned dural AV fistula. TEE showed large mass adherent to posterior annulus, fixed posterior leaflet and moderate to severe MR. Posterolateral mitral annulus mass consistent with degenerative MAC.   Pulmonary was consulted for CT chest findings, who noted: "Patient underwent bronchoscopy on 6/14 showed predominant lymphocytosis greater than 60% on BAL, felt to be consistent with methotrexate hypersensitivity  pneumonitis or feather pillow hypersensitivity pneumonitis or combination; rarely it can be ill IP from rheumatoid arthritis. CT chest on 03/30/2021 showed marked increase peribronchovascular groundglass and upper/mid lung zone with interstitial thickening and scattered collapse/consolidation. Suggestive of alternative diagnosis (not UIP).  Infectious disease was consulted on 03/31/2021 recommending Bactrim prophylactically if on prednisone greater than 20 mg/day for 2 months."  She was seen by pulmonary who has suggested speaking with rheum about changing methotrexate. She is currently on bactrim while completing a prolonged steroid taper (2-3 months). Work up for fever of unknown origin included full RVP, BAL, and blood cultures negative. She had a hemoccult positive but H&H remained stable. GI consulted and performed EGD that showed nonbleeding esophageal erosion/ulcer and 2 nonbleeding small gastric ulcers. Discharged on PO protonix.   She presents for follow up. On arrival, BG had dropped into the 40s - she has an insulin pump in place. She was given orange juice x2 and hard candy. She does not want to go to the ER. BG improved to 152.   She describes a spasm in her left chest that wraps around her left breast and radiates to her back that is consistent with her prior coronary vasospasm. However, episodes now seem more intense and last longer. She states her discharge instructions included stopping imdur. I will restart this - use for 3 days and then stop.   She states she was aware of the episode of Afib in the hospital and has had similar palpitations/fluttering about daily since discharge. She is also describes significant dizziness with the palpitations, which is new  for her. No frank syncope or collapse.    Past Medical History:  Diagnosis Date   Anxiety    Arthritis    "knees, wrists, back, elbows" (07/03/2017)   Clotting disorder ([place])    blood clot in eye 2016   Coronary artery disease,  non-occlusive    a. 11/2016 NSTEMI/Cath: LM nl, LAD 40p, 75m, D1/2 small, LCX 40ost, OM2/3 nl/small, RCA 35 ost/mid, RPDA/RPL small/nl.   Coronary vasospasm (HCC)    Depression    Diastolic dysfunction    a. 11/2016 Echo: EF 55-60%, gr1 DD, sev calcified MV annulus, mildly dil LA.   Eye hemorrhage 01/2015   "right; resolved" (07/03/2017)   Headache    "monthly" (07/03/2017)   Heart murmur    History of blood transfusion    "when I had an ectopic pregnancy"   History of stomach ulcers    Hyperlipidemia    Hypertension    Microcytic anemia    Mitral stenosis    OSA on CPAP    setting is unknown   PAT (paroxysmal atrial tachycardia) (HCC)    Pneumonia    "several times" (07/03/2017)   Premature atrial contractions    PVC's (premature ventricular contractions)    Syncope 07/03/2017 X 2   called seizures but no medications   Thalassemia    "my cells are sickle cell shaped but I don't have sickle cell anemia" (07/03/2017)   [place] II diabetes mellitus [place]     Past Surgical History:  Procedure Laterality Date   48-Hour Monitor  03/18/2017   Sinus rhythm with sinus tachycardia (rate 58-134 BPM)multiple PVCs noted with couplets and bigeminy. One triplet. 7 runs of PAT ranging from 100-130 bpm. Longest was 33 beats.   BIOPSY  04/03/2021   Procedure: BIOPSY;  Surgeon: AYetta Flock MD;  [facility];  Service: Gastroenterology;;   BREAST BIOPSY Left    BRONCHIAL WASHINGS  03/28/2021   Procedure: BRONCHIAL WASHINGS;  Surgeon: CJulian Hy DO;  [place]  Service: Endoscopy;;   CARDIAC CATHETERIZATION N/A 11/19/2016   Procedure: Left Heart Cath and Coronary Angiography;  Surgeon: HBelva Crome MD;  [facility];  Service: Cardiovascular.    LM nl, LAD 40p, 558m D1/2 small, LCX 40ost, OM2/3 nl/small, RCA 35 ost/mid, RPDA/RPL small/nl.   CARPAL TUNNEL RELEASE Right 11/01/2014   COLONOSCOPY     DILATION AND CURETTAGE OF UTERUS     ECTOPIC  PREGNANCY SURGERY  X 2   ESOPHAGOGASTRODUODENOSCOPY (EGD) WITH PROPOFOL N/A 04/03/2021   Procedure: ESOPHAGOGASTRODUODENOSCOPY (EGD) WITH PROPOFOL;  Surgeon: ArYetta FlockMD;  [facility];  Service: Gastroenterology;  Laterality: N/A;   JOINT REPLACEMENT     KNEE ARTHROSCOPY Left 11/2014   TEE WITHOUT CARDIOVERSION N/A 03/28/2021   Procedure: TRANSESOPHAGEAL ECHOCARDIOGRAM (TEE);  Surgeon: NaAcie FredricksonhWonda ChengMD;  [facility];  Service: Cardiovascular;  Laterality: N/A;   TONSILLECTOMY     TOTAL KNEE ARTHROPLASTY Right 02/18/2015   Procedure: RIGHT TOTAL KNEE ARTHROPLASTY;  Surgeon: StNetta CedarsMD;  [facility] Service: Orthopedics;  Laterality: Right;   TOTAL KNEE ARTHROPLASTY Left 08/19/2015   Procedure: LEFT TOTAL KNEE ARTHROPLASTY;  Surgeon: StNetta CedarsMD;  [place];  Laterality: Left;   TRANSTHORACIC ECHOCARDIOGRAM  11/2016; 07/2017:   a) normal LV size, thickness and function. EF 55-60%. GR 1 DD.No RWMA severe mitral annular calcification, but no mitral stenosis. Mild LA dilation;; b) normal LV size and function.  EF 65-75%.  Unable to assess diastolic function.  Aortic sclerosis with no stenosis.  Moderate mitral stenosis? (Gradient 9 mmHg) -?  Mild-mod left atrial enlargement    TRANSTHORACIC ECHOCARDIOGRAM  01/2019   EF 65 to 70%.  Normal function.  Elevated LVEDP-GR 1 DD.  Normal RV size and function.  Large calcific mass on posterior mitral leaflet mild to moderate mitral stenosis.   VAGINAL HYSTERECTOMY     VIDEO BRONCHOSCOPY N/A 03/28/2021   Procedure: VIDEO BRONCHOSCOPY WITHOUT FLUORO;  Surgeon: Julian Hy, DO;  [facility];  Service: Endoscopy;  Laterality: N/A;    Current Medications: Current Meds  Medication Sig   acetaminophen (TYLENOL) 500 MG tablet Take 500 mg by mouth every 6 (six) hours as needed for fever.   albuterol (VENTOLIN HFA) 108 (90 Base) MCG/ACT inhaler Inhale 2 puffs into the lungs  every 6 (six) hours as needed for wheezing or shortness of breath.   atorvastatin (LIPITOR) 80 MG tablet TAKE 1 TABLET(80 MG) BY MOUTH DAILY AT 6 PM   cetirizine (ZYRTEC) 10 MG tablet Take 10 mg by mouth daily as needed for allergies.   Continuous Blood Gluc Sensor (DEXCOM G6 SENSOR) MISC 1 Device by Does not apply route as directed.   Continuous Blood Gluc Transmit (DEXCOM G6 TRANSMITTER) MISC 1 Device by Does not apply route as directed.   fluticasone (FLOVENT DISKUS) 50 MCG/BLIST diskus inhaler Inhale 1 puff by mouth  into the lungs 2 (two) times daily.   folic acid (FOLVITE) 1 MG tablet Take 2 tablets (2 mg total) by mouth daily.   glucose blood (CONTOUR NEXT TEST) test strip 3x daily   Insulin Disposable Pump (OMNIPOD DASH PODS, GEN 4,) MISC Inject 1 Dose into the skin continuous. Using pump - depends on carb intake.   insulin glargine (LANTUS SOLOSTAR) 100 UNIT/ML Solostar Pen INJECT 54 UNITS INTO THE SKIN DAILY.   insulin lispro (HUMALOG) 100 UNIT/ML injection Inject 8 Units into the skin See admin instructions. Back up for pump.   Insulin Pen Needle (B-D UF III MINI PEN NEEDLES) 31G X 5 MM MISC USE DAILY WITH VICTOZA AND LANTUS.   Insulin Pen Needle 31G X 5 MM MISC 1 Device by Does not apply route 3 (three) times daily.   isosorbide mononitrate (IMDUR) 30 MG 24 hr tablet Take 30 mg  tablet as needed for Chest pain spasm episodes for 3 days and the stop (Patient taking differently: Take 30 mg  tablet as needed for Chest pain spasm episodes for 3 days and the stop)   Lancets 30G MISC 1 Device by Does not apply route 3 (three) times daily.   Multiple Vitamin (MULTIVITAMIN WITH MINERALS) TABS tablet Take 1 tablet by mouth daily.   pantoprazole (PROTONIX) 40 MG tablet Take 1 tablet (40 mg total) by mouth 2 (two) times daily.   predniSONE (DELTASONE) 10 MG tablet Take 3 tablets (30 mg total) by mouth daily with breakfast for 14 days, THEN 2 tablets (20 mg total) daily with breakfast for 14 days,  THEN 1 tablet (10 mg total) daily with breakfast for 14 days, THEN 0.5 tablets (5 mg total) daily with breakfast for 14 days, THEN 0.5 tablets (5 mg total) every Monday, Wednesday, and Friday for 14 days.   SAFETY-LOK TB SYRINGE 27GX.5" 27G X 1/2" 1 ML MISC    traZODone (DESYREL) 50 MG tablet TAKE 0.5-1 TABLETS BY MOUTH AT BEDTIME AS NEEDED FOR SLEEP.   umeclidinium-vilanterol (ANORO ELLIPTA) 62.5-25 MCG/INH AEPB Inhale  1 puff into the lungs daily.   VICTOZA 18 MG/3ML SOPN INJECT 1.8 MG UNDER THE SKIN ONCE DAILY     Allergies:   Penicillins, Metformin and related, and [place] [dapagliflozin]   Social History   Socioeconomic History   Marital status: Married    Spouse name: Not on file   Number of children: Not on file   Years of education: Not on file   Highest education level: Not on file  Occupational History   Occupation: retired  Tobacco Use   Smoking status: Former    Packs/day: 0.25    Years: 44.00    Pack years: 11.00    Types: Cigarettes    Quit date: 02/17/2015    Years since quitting: 6.2   Smokeless tobacco: Never  Vaping Use   Vaping Use: Never used  Substance and Sexual Activity   Alcohol use: Yes    Alcohol/week: 0.0 standard drinks    Comment: 07/03/2017 "might have a few drinks/year"   Drug use: No   Sexual activity: Never  Other Topics Concern   Not on file  Social History Narrative   Not on file   Social Determinants of Health   Financial Resource Strain: Not on file  Food Insecurity: Not on file  Transportation Needs: Not on file  Physical Activity: Not on file  Stress: Not on file  Social Connections: Not on file     Family History: The patient's family history includes Cancer in her father and mother; Diabetes in her maternal grandfather, maternal grandmother, mother, and sister; Hyperlipidemia in her father and mother; Hypertension in her mother; Mental illness in her sister. There is no history of Colon cancer, Esophageal cancer, Stomach cancer,  or Rectal cancer.  ROS:   Please see the history of present illness.     All other systems reviewed and are negative.  EKGs/Labs/Other Studies Reviewed:    The following studies were reviewed today:  03/28/21 TEE   1. Left ventricular ejection fraction, by estimation, is 55 to 60%. The  left ventricle has normal function. The left ventricle has no regional  wall motion abnormalities.   2. Right ventricular systolic function was not well visualized. The right  ventricular size is not well visualized.   3. No left atrial/left atrial appendage thrombus was detected.   4. 3-D images of the mitral annular mass were obtained. There is a large  circumscribed mass adherent to the posterior annulus. The posterior  leaflet is fixed . There is moderate - severe mitral stenosis.      The mass is large , measuring 1.8 x 1.4 cm. Differential diagnosis  includes myxoma, exuberant mitral annular calcification, myxoma, or other  primary cardiac tumor. suggest Cardiac MRI for further evaluation . This  mass was present on transthoracic  echo from 2018. . The mitral valve is abnormal. Mild mitral valve  regurgitation. Moderate to severe mitral stenosis. Severe mitral annular  calcification.   5. The aortic valve is tricuspid. Aortic valve regurgitation is not  visualized. No aortic stenosis is present.   6. There is mild (Grade II) plaque.   FINDINGS   Left Ventricle: Left ventricular ejection fraction, by estimation, is 55  to 60%. The left ventricle has normal function. The left ventricle has no  regional wall motion abnormalities. The left ventricular internal cavity  size was normal in size.   Mitral Valve: 3-D images of the mitral annular mass were obtained. There  is a large circumscribed mass adherent to  the posterior annulus. The  posterior leaflet is fixed . There is moderate - severe mitral stenosis.  The mass is large , measuring 1.8 x 1.4 cm. Differential diagnosis  includes myxoma,  exuberant mitral annular calcification, myxoma, or other  primary cardiac tumor. suggest Cardiac MRI for further evaluation . This  mass was present on transthoracic echo  from 2018. The mitral valve is abnormal. Severe mitral annular  calcification. Mild mitral valve regurgitation. Moderate to severe mitral  valve stenosis. MV peak gradient, 19.9 mmHg. The mean mitral valve  gradient is 14.0 mmHg.    2D echo 03/2021   1. Left ventricular ejection fraction, by estimation, is 60 to 65%. The  left ventricle has normal function. The left ventricle has no regional  wall motion abnormalities. There is moderate left ventricular hypertrophy  of the basal-septal segment. Left  ventricular diastolic parameters are consistent with Grade II diastolic  dysfunction (pseudonormalization). Elevated left ventricular end-diastolic  pressure.   2. Right ventricular systolic function is normal. The right ventricular  size is normal. There is mildly elevated pulmonary artery systolic  pressure. The estimated right ventricular systolic pressure is 35.5 mmHg.   3. Left atrial size was mildly dilated.   4. The mitral valve is abnormal. There is an ill defined density  measuring 3.23 x 2.10cm that appears to originate off the posterior MV  leaflet but cannot be definite on this. There is also mitral annular  calcification present. There is moderate mitral  stenosis with a mean MVG of 75mHg. No evidence of mitral valve  regurgitation.   5. The aortic valve is normal in structure. Aortic valve regurgitation is  not visualized. No aortic stenosis is present.   6. The inferior vena cava is normal in size with greater than 50%  respiratory variability, suggesting right atrial pressure of 3 mmHg.   7. Recommend TEE for possible mitral valve endocarditis in setting of  abnormal mitral valve and fevers.   LHC 2018 Moderate to heavy coronary calcification. Segmental 40-50% LAD narrowing. 30-50% proximal to mid  RCA. Coronaries are widely patent without high-grade stenosis. Fluoroscopy demonstration of inferolateral calcified mass, likely representing mitral annular calcification. Consider CT or MRI if there is not heavy mitral valve calcification on echocardiography. Spontaneous runs of supraventricular tachycardia during the procedure.     RECOMMENDATIONS:   No significant obstructive coronary disease is identified. Etiology of enzyme elevation and clinical presentation could potentially be supraventricular arrhythmia superimposed on the cardiac substrate noted above.  EKG:  EKG is  ordered today.  The ekg ordered today demonstrates sinus rhythm HR 88 with PACs  Recent Labs: 03/20/2021: Pro B Natriuretic peptide (BNP) 57.0; TSH 4.01 03/26/2021: B Natriuretic Peptide 152.5 04/05/2021: ALT 44; BUN 22; Creatinine, Ser 1.37; Hemoglobin 9.5; Magnesium 2.0; Platelets 336.0; Potassium 4.4; Sodium 136  Recent Lipid Panel    Component Value Date/Time   CHOL 97 08/22/2020 1414   TRIG 74.0 08/22/2020 1414   HDL 38.80 (L) 08/22/2020 1414   CHOLHDL 3 08/22/2020 1414   VLDL 14.8 08/22/2020 1414   LDLCALC 44 08/22/2020 1414   LDLDIRECT 131 (H) 09/02/2013 1004    Physical Exam:    VS:  BP 132/64   Pulse 88   Ht _0  (1.6 m)   Wt 244 lb (110.7 kg)   LMP  (LMP Unknown)   SpO2 98%   BMI 43.22 kg/m     Wt Readings from Last 3 Encounters:  05/18/21 244 lb (110.7 kg)  04/26/21  230 lb (104.3 kg)  04/07/21 232 lb 3.2 oz (105.3 kg)     GEN: Well nourished, well developed in no acute distress HEENT: Normal NECK: No JVD; No carotid bruits LYMPHATICS: No lymphadenopathy CARDIAC: irregular rhythm, regular rate, + murmur RESPIRATORY:  Clear to auscultation without rales, wheezing or rhonchi  ABDOMEN: Soft, non-tender, non-distended MUSCULOSKELETAL:  No edema; No deformity  SKIN: Warm and dry NEUROLOGIC:  Alert and oriented x 3 PSYCHIATRIC:  Normal affect   ASSESSMENT:    1. Palpitations   2.  Stage 3 chronic kidney disease, unspecified whether stage 3a or 3b CKD ([place])   3. PAF (paroxysmal atrial fibrillation) ([place])   4. Mitral valve stenosis, unspecified etiology   5. Coronary artery spasm ([place])   6. Coronary vasospasm (HCC)   7. Essential hypertension   8. PAT (paroxysmal atrial tachycardia) (HCC)   9. Paroxysmal atrial fibrillation (HCC)    PLAN:    In order of problems listed above:  Moderate-severe MR, MAC - followed with serial echos - discuss with Dr. Ellyn Hack timing of repeat echo   CAD Coronary vasospasm Hypertension - continue statin, imdur, losartan, cardizem   Paroxysmal atrial fibrillation - new diagnosis - diagnosed 03/26/21 - continue home cardizem - EKG today with sinus rhythm and PACs - having palpitations and dizziness - I will place a live zio   Need for oral anticoagulation This patients CHA2DS2-VASc Score and unadjusted Ischemic Stroke Rate (% per year) is equal to 7.2 % stroke rate/year from a score of 5 (HTN, DM, female, age, CAD) - she was not placed on on oral anticoagulation prior to discharge due to a positive hemoccult/GI bleed prior to discharge in the setting of anemia and thalassemia and 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 Ellyn Hack. She has a history of PAT, found on heart monitor. However, evidence of new Afib during last hospitalization. 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 She is also having palpitations and dizziness. Will place a live zio patch x 14 days. In the meantime, will need to get clearance from [facility] (?GI bleed/ulcers) and PCP (dural AV fistula) for anticoagulation.      Medication Adjustments/Labs and Tests Ordered: Current medicines are reviewed at length with the patient today.  Concerns regarding medicines are outlined above.  Orders  Placed This Encounter  Procedures   Basic metabolic panel   CBC   LONG TERM MONITOR-LIVE TELEMETRY (3-14 DAYS)   EKG 12-Lead    No orders of the defined types were placed in this encounter.   Signed, Tami Lin Zylan Almquist, PA  05/18/2021 2:08 PM    [facility]

## 2021-05-18 ENCOUNTER — Encounter: Payer: Self-pay | Admitting: Physician Assistant

## 2021-05-18 ENCOUNTER — Ambulatory Visit: Payer: Medicare Other | Admitting: Physician Assistant

## 2021-05-18 ENCOUNTER — Other Ambulatory Visit: Payer: Self-pay

## 2021-05-18 ENCOUNTER — Ambulatory Visit (INDEPENDENT_AMBULATORY_CARE_PROVIDER_SITE_OTHER): Payer: Medicare Other

## 2021-05-18 VITALS — BP 132/64 | HR 88 | Ht 63.0 in | Wt 244.0 lb

## 2021-05-18 DIAGNOSIS — I050 Rheumatic mitral stenosis: Secondary | ICD-10-CM

## 2021-05-18 DIAGNOSIS — I10 Essential (primary) hypertension: Secondary | ICD-10-CM | POA: Diagnosis not present

## 2021-05-18 DIAGNOSIS — E119 Type 2 diabetes mellitus without complications: Secondary | ICD-10-CM | POA: Diagnosis not present

## 2021-05-18 DIAGNOSIS — I471 Supraventricular tachycardia: Secondary | ICD-10-CM | POA: Diagnosis not present

## 2021-05-18 DIAGNOSIS — I480 Paroxysmal atrial fibrillation: Secondary | ICD-10-CM

## 2021-05-18 DIAGNOSIS — R002 Palpitations: Secondary | ICD-10-CM

## 2021-05-18 DIAGNOSIS — I201 Angina pectoris with documented spasm: Secondary | ICD-10-CM | POA: Diagnosis not present

## 2021-05-18 DIAGNOSIS — N183 Chronic kidney disease, stage 3 unspecified: Secondary | ICD-10-CM

## 2021-05-18 NOTE — Patient Instructions (Addendum)
Medication Instructions:  Your physician recommends that you continue on your current medications as directed. Please refer to the Current Medication list given to you today.  *If you need a refill on your cardiac medications before your next appointment, please call your pharmacy*  Lab Work: Your physician recommends that you return for lab work in:  BMET CBC If you have labs (blood work) drawn today and your tests are completely normal, you will receive your results only by: [employer] (if you have MyChart) OR A paper copy in the mail If you have any lab test that is abnormal or we need to change your treatment, we will call you to review the results.  Testing/Procedures:  ZIO AT Long term monitor-Live Telemetry  Your physician has requested you wear a ZIO patch monitor for 14 days.  This is a single patch monitor. [employer] supplies one patch monitor per enrollment. Additional  stickers are not available.  Please do not apply patch if you will be having a Nuclear Stress Test, Echocardiogram, Cardiac CT, MRI,  or Chest Xray during the period you would be wearing the monitor. The patch cannot be worn during  these tests. You cannot remove and re-apply the ZIO AT patch monitor.  Your ZIO patch monitor will be mailed 3 day USPS to your address on file. It may take 3-5 days to  receive your monitor after you have been enrolled.  Once you have received your monitor, please review the enclosed instructions. Your monitor has  already been registered assigning a specific monitor serial # to you.   Billing and Patient Assistance Program information  Theodore Demark has been supplied with any insurance information on record for billing. [employer] offers a sliding scale Patient Assistance Program for patients without insurance, or whose  insurance does not completely cover the cost of the ZIO patch monitor. You must apply for the  Patient Assistance Program to qualify for the discounted rate. To  apply, call [employer] at [phone],  select option 4, select option 2 , ask to apply for the Patient Assistance Program, (you can request an  interpreter if needed). [employer] will ask your household income and how many people are in your  household. [employer] will quote your out-of-pocket cost based on this information. They will also be able  to set up a 12 month interest free payment plan if needed.  Applying the monitor   Shave hair from upper left chest.  Hold the abrader disc by orange tab. Rub the abrader in 40 strokes over left upper chest as indicated in  your monitor instructions.  Clean area with 4 enclosed alcohol pads. Use all pads to ensure the area is cleaned thoroughly. Let  dry.  Apply patch as indicated in monitor instructions. Patch will be placed under collarbone on left side of  chest with arrow pointing upward.  Rub patch adhesive wings for 2 minutes. Remove the white label marked "1". Remove the white label  marked "2". Rub patch adhesive wings for 2 additional minutes.  While looking in a mirror, press and release button in center of patch. A small green light will flash 3-4  times. This will be your only indicator that the monitor has been turned on.  Do not shower for the first 24 hours. You may shower after the first 24 hours.  Press the button if you feel a symptom. You will hear a small click. Record Date, Time and Symptom in  the Patient Log.   Starting the  Gateway  In your kit there is a Hydrographic surveyor box the size of a cellphone. This is Airline pilot. It transmits all your  recorded data to [facility]. This box must always stay within 10 feet of you. Open the box and push the *  button. There will be a light that blinks orange and then green a few times. When the light stops  blinking, the Gateway is connected to the ZIO patch. Call [employer] at [phone] to confirm your monitor is transmitting.  Returning your monitor  Remove your patch and place it inside  the [place]. In the lower half of the Gateway there is a white  bag with prepaid postage on it. Place Gateway in bag and seal. Mail package back to [place] as soon as  possible. Your physician should have your final report approximately 7 days after you have mailed back  your monitor. Call [place] at [phone] if you have questions regarding your ZIO AT  patch monitor. Call them immediately if you see an orange light blinking on your monitor.  If your monitor falls off in less than 4 days, contact our Monitor department at [phone]. If your  monitor becomes loose or falls off after 4 days call [employer] at [phone] for suggestions on  securing your monitor    Follow-Up: At [facility], you and your health needs are our priority.  As part of our continuing mission to provide you with exceptional heart care, we have created designated Provider Care Teams.  These Care Teams include your primary Cardiologist (physician) and Advanced Practice Providers (APPs -  Physician Assistants and Nurse Practitioners) who all work together to provide you with the care you need, when you need it.  Your next appointment:   As scheduled    The format for your next appointment:   In Person  Provider:   Glenetta Hew, MD   Other Instructions See PCP to get clearance for Anticoagulant give Brain MRI findings  Follow up with [facility] (GI)

## 2021-05-18 NOTE — Progress Notes (Unsigned)
Patient enrolled for [employer] to mail a ZIO AT monitor to her address on file.

## 2021-05-19 LAB — BASIC METABOLIC PANEL
BUN/Creatinine Ratio: 14 (ref 12–28)
BUN: 16 mg/dL (ref 8–27)
CO2: 24 mmol/L (ref 20–29)
Calcium: 9.4 mg/dL (ref 8.7–10.3)
Chloride: 100 mmol/L (ref 96–106)
Creatinine, Ser: 1.15 mg/dL — ABNORMAL HIGH (ref 0.57–1.00)
Glucose: 137 mg/dL — ABNORMAL HIGH (ref 65–99)
Potassium: 4 mmol/L (ref 3.5–5.2)
Sodium: 141 mmol/L (ref 134–144)
eGFR: 52 mL/min/{1.73_m2} — ABNORMAL LOW (ref >59)

## 2021-05-19 LAB — CBC
Hematocrit: 30.6 % — ABNORMAL LOW (ref 34.0–46.6)
Hemoglobin: 9.3 g/dL — ABNORMAL LOW (ref 11.1–15.9)
MCH: 19.2 pg — ABNORMAL LOW (ref 26.6–33.0)
MCHC: 30.4 g/dL — ABNORMAL LOW (ref 31.5–35.7)
MCV: 63 fL — ABNORMAL LOW (ref 79–97)
Platelets: 306 10*3/uL (ref 150–450)
RBC: 4.85 x10E6/uL (ref 3.77–5.28)
RDW: 18.3 % — ABNORMAL HIGH (ref 11.7–15.4)
WBC: 15.8 10*3/uL — ABNORMAL HIGH (ref 3.4–10.8)

## 2021-05-22 ENCOUNTER — Encounter: Payer: Self-pay | Admitting: Family Medicine

## 2021-05-22 DIAGNOSIS — R002 Palpitations: Secondary | ICD-10-CM | POA: Diagnosis not present

## 2021-05-22 DIAGNOSIS — Q282 Arteriovenous malformation of cerebral vessels: Secondary | ICD-10-CM

## 2021-05-22 DIAGNOSIS — I480 Paroxysmal atrial fibrillation: Secondary | ICD-10-CM | POA: Diagnosis not present

## 2021-05-29 DIAGNOSIS — M2550 Pain in unspecified joint: Secondary | ICD-10-CM | POA: Diagnosis not present

## 2021-05-29 DIAGNOSIS — R5382 Chronic fatigue, unspecified: Secondary | ICD-10-CM | POA: Diagnosis not present

## 2021-05-29 DIAGNOSIS — M0579 Rheumatoid arthritis with rheumatoid factor of multiple sites without organ or systems involvement: Secondary | ICD-10-CM | POA: Diagnosis not present

## 2021-05-30 ENCOUNTER — Encounter: Payer: Self-pay | Admitting: Family Medicine

## 2021-06-01 DIAGNOSIS — I480 Paroxysmal atrial fibrillation: Secondary | ICD-10-CM | POA: Diagnosis not present

## 2021-06-01 DIAGNOSIS — R002 Palpitations: Secondary | ICD-10-CM | POA: Diagnosis not present

## 2021-06-06 DIAGNOSIS — Q283 Other malformations of cerebral vessels: Secondary | ICD-10-CM | POA: Diagnosis not present

## 2021-06-08 ENCOUNTER — Other Ambulatory Visit: Payer: Self-pay | Admitting: Neurosurgery

## 2021-06-08 ENCOUNTER — Ambulatory Visit (INDEPENDENT_AMBULATORY_CARE_PROVIDER_SITE_OTHER): Payer: Medicare Other

## 2021-06-08 VITALS — Ht 64.0 in | Wt 244.0 lb

## 2021-06-08 DIAGNOSIS — Z0000 Encounter for general adult medical examination without abnormal findings: Secondary | ICD-10-CM | POA: Diagnosis not present

## 2021-06-08 DIAGNOSIS — Q283 Other malformations of cerebral vessels: Secondary | ICD-10-CM

## 2021-06-08 NOTE — Progress Notes (Signed)
Subjective:   Lynn [name] is a 65 y.o. female who presents for an Initial Medicare Annual Wellness Visit.  I connected with Lilas today by telephone and verified that I am speaking with the correct person using two identifiers. Location patient: home Location provider: work Persons participating in the virtual visit: patient, Marine scientist.    I discussed the limitations, risks, security and privacy concerns of performing an evaluation and management service by telephone and the availability of in person appointments. I also discussed with the patient that there may be a patient responsible charge related to this service. The patient expressed understanding and verbally consented to this telephonic visit.    Interactive audio and video telecommunications were attempted between this provider and patient, however failed, due to patient having technical difficulties OR patient did not have access to video capability.  We continued and completed visit with audio only.  Some vital signs may be absent or patient reported.   Time Spent with patient on telephone encounter: 20 minutes   Review of Systems     Cardiac Risk Factors include: advanced age (>66mn, >>20women);diabetes mellitus;dyslipidemia;hypertension;obesity (BMI >30kg/m2);sedentary lifestyle     Objective:    Today's Vitals   06/08/21 1301  Weight: 244 lb (110.7 kg)  Height: '5\' 4"'$  (1.626 m)   Body mass index is 41.88 kg/m.  Advanced Directives 06/08/2021 04/03/2021 03/23/2021 05/01/2019 08/12/2017 07/03/2017 02/26/2017  Does Patient Have a Medical Advance Directive? No No No No No No No  Would patient like information on creating a medical advance directive? No - Patient declined No - Patient declined Yes (ED - Information included in AVS) Yes (MAU/Ambulatory/Procedural Areas - Information given) No - Patient declined No - Patient declined -    Current Medications (verified) Outpatient Encounter Medications as of  06/08/2021  Medication Sig   acetaminophen (TYLENOL) 500 MG tablet Take 500 mg by mouth every 6 (six) hours as needed for fever.   albuterol (VENTOLIN HFA) 108 (90 Base) MCG/ACT inhaler Inhale 2 puffs into the lungs every 6 (six) hours as needed for wheezing or shortness of breath.   atorvastatin (LIPITOR) 80 MG tablet TAKE 1 TABLET(80 MG) BY MOUTH DAILY AT 6 PM   cetirizine (ZYRTEC) 10 MG tablet Take 10 mg by mouth daily as needed for allergies.   Continuous Blood Gluc Sensor (DEXCOM G6 SENSOR) MISC 1 Device by Does not apply route as directed.   Continuous Blood Gluc Transmit (DEXCOM G6 TRANSMITTER) MISC 1 Device by Does not apply route as directed.   fluticasone (FLOVENT DISKUS) 50 MCG/BLIST diskus inhaler Inhale 1 puff by mouth  into the lungs 2 (two) times daily.   folic acid (FOLVITE) 1 MG tablet Take 2 tablets (2 mg total) by mouth daily.   glucose blood (CONTOUR NEXT TEST) test strip 3x daily   Insulin Disposable Pump (OMNIPOD DASH PODS, GEN 4,) MISC Inject 1 Dose into the skin continuous. Using pump - depends on carb intake.   insulin glargine (LANTUS SOLOSTAR) 100 UNIT/ML Solostar Pen INJECT 54 UNITS INTO THE SKIN DAILY.   insulin lispro (HUMALOG) 100 UNIT/ML injection Inject 8 Units into the skin See admin instructions. Back up for pump.   Insulin Pen Needle (B-D UF III MINI PEN NEEDLES) 31G X 5 MM MISC USE DAILY WITH VICTOZA AND LANTUS.   Insulin Pen Needle 31G X 5 MM MISC 1 Device by Does not apply route 3 (three) times daily.   isosorbide mononitrate (IMDUR) 30 MG 24 hr tablet Take  30 mg  tablet as needed for Chest pain spasm episodes for 3 days and the stop (Patient taking differently: Take 30 mg  tablet as needed for Chest pain spasm episodes for 3 days and the stop)   Lancets 30G MISC 1 Device by Does not apply route 3 (three) times daily.   Multiple Vitamin (MULTIVITAMIN WITH MINERALS) TABS tablet Take 1 tablet by mouth daily.   pantoprazole (PROTONIX) 40 MG tablet Take 1 tablet  (40 mg total) by mouth 2 (two) times daily.   predniSONE (DELTASONE) 10 MG tablet Take 3 tablets (30 mg total) by mouth daily with breakfast for 14 days, THEN 2 tablets (20 mg total) daily with breakfast for 14 days, THEN 1 tablet (10 mg total) daily with breakfast for 14 days, THEN 0.5 tablets (5 mg total) daily with breakfast for 14 days, THEN 0.5 tablets (5 mg total) every Monday, Wednesday, and Friday for 14 days.   SAFETY-LOK TB SYRINGE 27GX.5" 27G X 1/2" 1 ML MISC    traZODone (DESYREL) 50 MG tablet TAKE 0.5-1 TABLETS BY MOUTH AT BEDTIME AS NEEDED FOR SLEEP.   umeclidinium-vilanterol (ANORO ELLIPTA) 62.5-25 MCG/INH AEPB Inhale 1 puff into the lungs daily.   VICTOZA 18 MG/3ML SOPN INJECT 1.8 MG UNDER THE SKIN ONCE DAILY   diltiazem (CARDIZEM CD) 240 MG 24 hr capsule Take 1 capsule (240 mg total) by mouth daily.   losartan (COZAAR) 100 MG tablet Take 1 tablet (100 mg total) by mouth daily.   No facility-administered encounter medications on file as of 06/08/2021.    Allergies (verified) Penicillins, Metformin and related, and Farxiga [dapagliflozin]   History: Past Medical History:  Diagnosis Date   Anxiety    Arthritis    "knees, wrists, back, elbows" (07/03/2017)   Clotting disorder ([place])    blood clot in eye 2016   Coronary artery disease, non-occlusive    a. 11/2016 NSTEMI/Cath: LM nl, LAD 40p, 34m, D1/2 small, LCX 40ost, OM2/3 nl/small, RCA 35 ost/mid, RPDA/RPL small/nl.   Coronary vasospasm (HCC)    Depression    Diastolic dysfunction    a. 11/2016 Echo: EF 55-60%, gr1 DD, sev calcified MV annulus, mildly dil LA.   Eye hemorrhage 01/2015   "right; resolved" (07/03/2017)   Headache    "monthly" (07/03/2017)   Heart murmur    History of blood transfusion    "when I had an ectopic pregnancy"   History of stomach ulcers    Hyperlipidemia    Hypertension    Microcytic anemia    Mitral stenosis    OSA on CPAP    setting is unknown   PAT (paroxysmal atrial tachycardia) (HCC)     Pneumonia    "several times" (07/03/2017)   Premature atrial contractions    PVC's (premature ventricular contractions)    Syncope 07/03/2017 X 2   called seizures but no medications   Thalassemia    "my cells are sickle cell shaped but I don't have sickle cell anemia" (07/03/2017)   Type II diabetes mellitus ([place]    Past Surgical History:  Procedure Laterality Date   48-Hour Monitor  03/18/2017   Sinus rhythm with sinus tachycardia (rate 58-134 BPM)multiple PVCs noted with couplets and bigeminy. One triplet. 7 runs of PAT ranging from 100-130 bpm. Longest was 33 beats.   BIOPSY  04/03/2021   Procedure: BIOPSY;  Surgeon: AYetta Flock MD;  Location: [facility];  Service: Gastroenterology;;   BREAST BIOPSY Left    BRONCHIAL WASHINGS  03/28/2021  Procedure: BRONCHIAL WASHINGS;  Surgeon: Julian Hy, DO;  Location: [place];  Service: Endoscopy;;   CARDIAC CATHETERIZATION N/A 11/19/2016   Procedure: Left Heart Cath and Coronary Angiography;  Surgeon: Belva Crome, MD;  Location: [place] [facility];  Service: Cardiovascular.    LM nl, LAD 40p, 78m, D1/2 small, LCX 40ost, OM2/3 nl/small, RCA 35 ost/mid, RPDA/RPL small/nl.   CARPAL TUNNEL RELEASE Right 11/01/2014   COLONOSCOPY     DILATION AND CURETTAGE OF UTERUS     ECTOPIC PREGNANCY SURGERY  X 2   ESOPHAGOGASTRODUODENOSCOPY (EGD) WITH PROPOFOL N/A 04/03/2021   Procedure: ESOPHAGOGASTRODUODENOSCOPY (EGD) WITH PROPOFOL;  Surgeon: AYetta Flock MD;  Location: [facility];  Service: Gastroenterology;  Laterality: N/A;   JOINT REPLACEMENT     KNEE ARTHROSCOPY Left 11/2014   TEE WITHOUT CARDIOVERSION N/A 03/28/2021   Procedure: TRANSESOPHAGEAL ECHOCARDIOGRAM (TEE);  Surgeon: NAcie FredricksonPWonda Cheng MD;  [facility];  Service: Cardiovascular;  Laterality: N/A;   TONSILLECTOMY     TOTAL KNEE ARTHROPLASTY Right 02/18/2015   Procedure: RIGHT TOTAL KNEE ARTHROPLASTY;  Surgeon: SNetta Cedars MD;  [place]   Service: Orthopedics;  Laterality: Right;   TOTAL KNEE ARTHROPLASTY Left 08/19/2015   Procedure: LEFT TOTAL KNEE ARTHROPLASTY;  Surgeon: SNetta Cedars MD;  [place] [facility]  Service: Orthopedics;  Laterality: Left;   TRANSTHORACIC ECHOCARDIOGRAM  11/2016; 07/2017:   a) normal LV size, thickness and function. EF 55-60%. GR 1 DD.No RWMA severe mitral annular calcification, but no mitral stenosis. Mild LA dilation;; b) normal LV size and function.  EF 65-75%.  Unable to assess diastolic function.  Aortic sclerosis with no stenosis.  Moderate mitral stenosis? (Gradient 9 mmHg) -?  Mild-mod left atrial enlargement    TRANSTHORACIC ECHOCARDIOGRAM  01/2019   EF 65 to 70%.  Normal function.  Elevated LVEDP-GR 1 DD.  Normal RV size and function.  Large calcific mass on posterior mitral leaflet mild to moderate mitral stenosis.   VAGINAL HYSTERECTOMY     VIDEO BRONCHOSCOPY N/A 03/28/2021   Procedure: VIDEO BRONCHOSCOPY WITHOUT FLUORO;  Surgeon: CJulian Hy DO;  [facility];  Service: Endoscopy;  Laterality: N/A;   Family History  Problem Relation Age of Onset   Cancer Mother    Diabetes Mother    Hypertension Mother    Hyperlipidemia Mother    Cancer Father    Hyperlipidemia Father    Mental illness Sister    Diabetes Sister    Diabetes Maternal Grandmother    Diabetes Maternal Grandfather    Colon cancer Neg Hx    Esophageal cancer Neg Hx    Stomach cancer Neg Hx    Rectal cancer Neg Hx    Social History   Socioeconomic History   Marital status: Married    Spouse name: Not on file   Number of children: Not on file   Years of education: Not on file   Highest education level: Not on file  Occupational History   Occupation: retired  Tobacco Use   Smoking status: Former    Packs/day: 0.25    Years: 44.00    Pack years: 11.00    Types: Cigarettes    Quit date: 02/17/2015    Years since quitting: 6.3   Smokeless tobacco: Never  Vaping Use   Vaping Use: Never used   Substance and Sexual Activity   Alcohol use: Not Currently    Comment: 07/03/2017 "might have a few drinks/year"   Drug use: No  Sexual activity: Never  Other Topics Concern   Not on file  Social History Narrative   Not on file   Social Determinants of Health   Financial Resource Strain: Low Risk    Difficulty of Paying Living Expenses: Not hard at all  Food Insecurity: No Food Insecurity   Worried About Charity fundraiser in the Last Year: Never true   [place] in the Last Year: Never true  Transportation Needs: No Transportation Needs   Lack of Transportation (Medical): No   Lack of Transportation (Non-Medical): No  Physical Activity: Inactive   Days of Exercise per Week: 0 days   Minutes of Exercise per Session: 0 min  Stress: Stress Concern Present   Feeling of Stress : To some extent  Social Connections: Moderately Isolated   Frequency of Communication with Friends and Family: More than three times a week   Frequency of Social Gatherings with Friends and Family: More than three times a week   Attends Religious Services: Never   Marine scientist or Organizations: No   Attends Music therapist: Never   Marital Status: Married    Tobacco Counseling Counseling given: Not Answered   Clinical Intake:  Pre-visit preparation completed: Yes  Pain : No/denies pain     Nutritional Status: BMI > 30  Obese Nutritional Risks: None Diabetes: Yes CBG done?: No Did pt. bring in CBG monitor from home?: No (phone visit)  How often do you need to have someone help you when you read instructions, pamphlets, or other written materials from your doctor or pharmacy?: 1 - Never  Diabetes:  Is the patient diabetic?  No  If diabetic, was a CBG obtained today?  No  Did the patient bring in their glucometer from home?  No phone visit How often do you monitor your CBG's? Dexcom.   Financial Strains and Diabetes Management:  Are you having any  financial strains with the device, your supplies or your medication? No .  Does the patient want to be seen by Chronic Care Management for management of their diabetes?  No  Would the patient like to be referred to a Nutritionist or for Diabetic Management?  No   Diabetic Exams:  Diabetic Eye Exam: Completed 08/19/2020.   Diabetic Foot Exam: Completed 02/20/2021.   Interpreter Needed?: No  Information entered by :: Caroleen Hamman LPN   Activities of Daily Living In your present state of health, do you have any difficulty performing the following activities: 06/08/2021 03/24/2021  Hearing? N -  Vision? N -  Difficulty concentrating or making decisions? N -  Walking or climbing stairs? N -  Comment - -  Dressing or bathing? N -  Doing errands, shopping? N N  Preparing Food and eating ? N -  Using the Toilet? N -  In the past six months, have you accidently leaked urine? Y -  Do you have problems with loss of bowel control? N -  Managing your Medications? N -  Managing your Finances? N -  Housekeeping or managing your Housekeeping? Y -  Comment husband assists -  Some recent data might be hidden    Patient Care Team: Copland, Gay Filler, MD as PCP - General (Family Medicine) Leonie Man, MD as PCP - Cardiology (Cardiology)  Indicate any recent Medical Services you may have received from other than [facility] providers in the past year (date may be approximate).     Assessment:   This  is a routine wellness examination for Memori.  Hearing/Vision screen Hearing Screening - Comments:: No issues Vision Screening - Comments:: Last eye exam-08/19/2020  Dietary issues and exercise activities discussed: Current Exercise Habits: The patient does not participate in regular exercise at present, Exercise limited by: respiratory conditions(s)   Goals Addressed             This Visit's Progress    Patient Stated       Eat healthier & drink more water       Depression  Screen PHQ 2/9 Scores 06/08/2021 02/20/2021 05/01/2019 01/30/2017 01/03/2016 07/13/2015 03/07/2015  PHQ - 2 Score 1 0 0 0 0 0 0    Fall Risk Fall Risk  06/08/2021 02/20/2021 05/01/2019 01/22/2017 07/13/2015  Falls in the past year? 0 1 0 No Yes  Comment - - - - due to knee, had fallen twice  Number falls in past yr: 0 1 - - -  Injury with Fall? 0 0 - - -  Follow up Falls prevention discussed - - - -    FALL RISK PREVENTION PERTAINING TO THE HOME:  Any stairs in or around the home? Yes  If so, are there any without handrails? No  Home free of loose throw rugs in walkways, pet beds, electrical cords, etc? Yes  Adequate lighting in your home to reduce risk of falls? Yes   ASSISTIVE DEVICES UTILIZED TO PREVENT FALLS:  Life alert? No  Use of a cane, walker or w/c? No  Grab bars in the bathroom? Yes  Shower chair or bench in shower? No  Elevated toilet seat or a handicapped toilet? No   TIMED UP AND GO:  Was the test performed? No . Phone visit   Cognitive Function:Normal cognitive status assessed by  this Nurse Health Advisor. No abnormalities found.          Immunizations Immunization History  Administered Date(s) Administered   Influenza Split 07/22/2012   Influenza, High Dose Seasonal PF 06/10/2019   Influenza,inj,Quad PF,6+ Mos 10/09/2014, 07/13/2015, 11/17/2016, 07/04/2017, 07/10/2018   Influenza-Unspecified 06/20/2020   Moderna Sars-Covid-2 Vaccination 11/27/2019, 12/26/2019, 06/22/2020, 12/30/2020   Pneumococcal Conjugate-13 03/23/2019   Pneumococcal Polysaccharide-23 08/22/2020   Pneumococcal-Unspecified 02/12/2010   Tdap 02/12/2010, 10/17/2016   Zoster Recombinat (Shingrix) 02/20/2021    TDAP status: Up to date  Flu Vaccine status: Due, Education has been provided regarding the importance of this vaccine. Advised may receive this vaccine at local pharmacy or Health Dept. Aware to provide a copy of the vaccination record if obtained from local pharmacy or Health Dept.  Verbalized acceptance and understanding.  Pneumococcal vaccine status: Up to date  Covid-19 vaccine status: Completed vaccines  Qualifies for Shingles Vaccine? No   Zostavax completed No   Shingrix Completed?: Yes  Screening Tests Health Maintenance  Topic Date Due   Zoster Vaccines- Shingrix (2 of 2) 04/17/2021   COVID-19 Vaccine (5 - Booster for Moderna series) 05/01/2021   INFLUENZA VACCINE  05/15/2021   HEMOGLOBIN A1C  08/01/2021   OPHTHALMOLOGY EXAM  08/19/2021   URINE MICROALBUMIN  01/30/2022   FOOT EXAM  02/20/2022   MAMMOGRAM  06/10/2022   COLONOSCOPY (Pts 45-98yr Insurance coverage will need to be confirmed)  08/23/2026   TETANUS/TDAP  10/17/2026   DEXA SCAN  Completed   Hepatitis C Screening  Completed   PNA vac Low Risk Adult  Completed   HPV VACCINES  Aged Out    Health Maintenance  Health Maintenance Due  Topic Date Due  Zoster Vaccines- Shingrix (2 of 2) 04/17/2021   COVID-19 Vaccine (5 - Booster for Moderna series) 05/01/2021   INFLUENZA VACCINE  05/15/2021    Colorectal cancer screening: Type of screening: Colonoscopy. Completed 08/23/2016. Repeat every 5 years  Mammogram status: Scheduled for 06/12/2021  Bone Density status: Completed 07/08/2019. Results reflect: Bone density results: NORMAL. Repeat every 2 years.  Lung Cancer Screening: (Low Dose CT Chest recommended if Age 26-80 years, 30 pack-year currently smoking OR have quit w/in 15years.) does not qualify.    Additional Screening:  Hepatitis C Screening: Completed 03/25/2021  Vision Screening: Recommended annual ophthalmology exams for early detection of glaucoma and other disorders of the eye. Is the patient up to date with their annual eye exam?  Yes  Who is the provider or what is the name of the office in which the patient attends annual eye exams? [facility]   Dental Screening: Recommended annual dental exams for proper oral hygiene  Community Resource Referral / Chronic Care  Management: CRR required this visit?  No   CCM required this visit?  No      Plan:     I have personally reviewed and noted the following in the patient's chart:   Medical and social history Use of alcohol, tobacco or illicit drugs  Current medications and supplements including opioid prescriptions. Patient is not currently taking opioid prescriptions. Functional ability and status Nutritional status Physical activity Advanced directives List of other physicians Hospitalizations, surgeries, and ER visits in previous 12 months Vitals Screenings to include cognitive, depression, and falls Referrals and appointments  In addition, I have reviewed and discussed with patient certain preventive protocols, quality metrics, and best practice recommendations. A written personalized care plan for preventive services as well as general preventive health recommendations were provided to patient.   Due to this being a telephonic visit, the after visit summary with patients personalized plan was offered to patient via mail or my-chart. Patient would like to access on my-chart.   Marta Antu, LPN   [id number]  Nurse Health Advisor  Nurse Notes: None

## 2021-06-08 NOTE — Patient Instructions (Signed)
Lynn [name] , Thank you for taking time to complete your Medicare Wellness Visit. I appreciate your ongoing commitment to your health goals. Please review the following plan we discussed and let me know if I can assist you in the future.   Screening recommendations/referrals: Colonoscopy: Completed 08/23/2016-Due 08/23/2021 Mammogram: Scheduled 06/12/2021 Bone Density: Completed 07/08/2019-Due 07/07/2021 Recommended yearly ophthalmology/optometry visit for glaucoma screening and checkup Recommended yearly dental visit for hygiene and checkup  Vaccinations: Influenza vaccine: Due-May obtain vaccine at our office or your local pharmacy Pneumococcal vaccine: Up to date Tdap vaccine: Up to date-Due-10/2026 Shingles vaccine: 2nd dose due   Covid-19:Up to date  Advanced directives: Please bring a copy for your chart once complete  Conditions/risks identified: See problem list  Next appointment: Follow up in one year for your annual wellness visit 06/14/2022 @ 1:00   Preventive Care 65 Years and Older, Female Preventive care refers to lifestyle choices and visits with your health care provider that can promote health and wellness. What does preventive care include? A yearly physical exam. This is also called an annual well check. Dental exams once or twice a year. Routine eye exams. Ask your health care provider how often you should have your eyes checked. Personal lifestyle choices, including: Daily care of your teeth and gums. Regular physical activity. Eating a healthy diet. Avoiding tobacco and drug use. Limiting alcohol use. Practicing safe sex. Taking low-dose aspirin every day. Taking vitamin and mineral supplements as recommended by your health care provider. What happens during an annual well check? The services and screenings done by your health care provider during your annual well check will depend on your age, overall health, lifestyle risk factors, and family history of  disease. Counseling  Your health care provider may ask you questions about your: Alcohol use. Tobacco use. Drug use. Emotional well-being. Home and relationship well-being. Sexual activity. Eating habits. History of falls. Memory and ability to understand (cognition). Work and work Statistician. Reproductive health. Screening  You may have the following tests or measurements: Height, weight, and BMI. Blood pressure. Lipid and cholesterol levels. These may be checked every 5 years, or more frequently if you are over 72 years old. Skin check. Lung cancer screening. You may have this screening every year starting at age 26 if you have a 30-pack-year history of smoking and currently smoke or have quit within the past 15 years. Fecal occult blood test (FOBT) of the stool. You may have this test every year starting at age 40. Flexible sigmoidoscopy or colonoscopy. You may have a sigmoidoscopy every 5 years or a colonoscopy every 10 years starting at age 8. Hepatitis C blood test. Hepatitis B blood test. Sexually transmitted disease (STD) testing. Diabetes screening. This is done by checking your blood sugar (glucose) after you have not eaten for a while (fasting). You may have this done every 1-3 years. Bone density scan. This is done to screen for osteoporosis. You may have this done starting at age 60. Mammogram. This may be done every 1-2 years. Talk to your health care provider about how often you should have regular mammograms. Talk with your health care provider about your test results, treatment options, and if necessary, the need for more tests. Vaccines  Your health care provider may recommend certain vaccines, such as: Influenza vaccine. This is recommended every year. Tetanus, diphtheria, and acellular pertussis (Tdap, Td) vaccine. You may need a Td booster every 10 years. Zoster vaccine. You may need this after age 72. Pneumococcal 13-valent  conjugate (PCV13) vaccine. One  dose is recommended after age 45. Pneumococcal polysaccharide (PPSV23) vaccine. One dose is recommended after age 109. Talk to your health care provider about which screenings and vaccines you need and how often you need them. This information is not intended to replace advice given to you by your health care provider. Make sure you discuss any questions you have with your health care provider. Document Released: 10/28/2015 Document Revised: 06/20/2016 Document Reviewed: 08/02/2015 Elsevier Interactive Patient Education  2017 [place] Prevention in the Home Falls can cause injuries. They can happen to people of all ages. There are many things you can do to make your home safe and to help prevent falls. What can I do on the outside of my home? Regularly fix the edges of walkways and driveways and fix any cracks. Remove anything that might make you trip as you walk through a door, such as a raised step or threshold. Trim any bushes or trees on the path to your home. Use bright outdoor lighting. Clear any walking paths of anything that might make someone trip, such as rocks or tools. Regularly check to see if handrails are loose or broken. Make sure that both sides of any steps have handrails. Any raised decks and porches should have guardrails on the edges. Have any leaves, snow, or ice cleared regularly. Use sand or salt on walking paths during winter. Clean up any spills in your garage right away. This includes oil or grease spills. What can I do in the bathroom? Use night lights. Install grab bars by the toilet and in the tub and shower. Do not use towel bars as grab bars. Use non-skid mats or decals in the tub or shower. If you need to sit down in the shower, use a plastic, non-slip stool. Keep the floor dry. Clean up any water that spills on the floor as soon as it happens. Remove soap buildup in the tub or shower regularly. Attach bath mats securely with double-sided  non-slip rug tape. Do not have throw rugs and other things on the floor that can make you trip. What can I do in the bedroom? Use night lights. Make sure that you have a light by your bed that is easy to reach. Do not use any sheets or blankets that are too big for your bed. They should not hang down onto the floor. Have a firm chair that has side arms. You can use this for support while you get dressed. Do not have throw rugs and other things on the floor that can make you trip. What can I do in the kitchen? Clean up any spills right away. Avoid walking on wet floors. Keep items that you use a lot in easy-to-reach places. If you need to reach something above you, use a strong step stool that has a grab bar. Keep electrical cords out of the way. Do not use floor polish or wax that makes floors slippery. If you must use wax, use non-skid floor wax. Do not have throw rugs and other things on the floor that can make you trip. What can I do with my stairs? Do not leave any items on the stairs. Make sure that there are handrails on both sides of the stairs and use them. Fix handrails that are broken or loose. Make sure that handrails are as long as the stairways. Check any carpeting to make sure that it is firmly attached to the stairs. Fix any carpet that  is loose or worn. Avoid having throw rugs at the top or bottom of the stairs. If you do have throw rugs, attach them to the floor with carpet tape. Make sure that you have a light switch at the top of the stairs and the bottom of the stairs. If you do not have them, ask someone to add them for you. What else can I do to help prevent falls? Wear shoes that: Do not have high heels. Have rubber bottoms. Are comfortable and fit you well. Are closed at the toe. Do not wear sandals. If you use a stepladder: Make sure that it is fully opened. Do not climb a closed stepladder. Make sure that both sides of the stepladder are locked into place. Ask  someone to hold it for you, if possible. Clearly mark and make sure that you can see: Any grab bars or handrails. First and last steps. Where the edge of each step is. Use tools that help you move around (mobility aids) if they are needed. These include: Canes. Walkers. Scooters. Crutches. Turn on the lights when you go into a dark area. Replace any light bulbs as soon as they burn out. Set up your furniture so you have a clear path. Avoid moving your furniture around. If any of your floors are uneven, fix them. If there are any pets around you, be aware of where they are. Review your medicines with your doctor. Some medicines can make you feel dizzy. This can increase your chance of falling. Ask your doctor what other things that you can do to help prevent falls. This information is not intended to replace advice given to you by your health care provider. Make sure you discuss any questions you have with your health care provider. Document Released: 07/28/2009 Document Revised: 03/08/2016 Document Reviewed: 11/05/2014 Elsevier Interactive Patient Education  2017 [employer].

## 2021-06-09 ENCOUNTER — Other Ambulatory Visit: Payer: Self-pay

## 2021-06-09 ENCOUNTER — Encounter: Payer: Self-pay | Admitting: Internal Medicine

## 2021-06-09 ENCOUNTER — Ambulatory Visit (INDEPENDENT_AMBULATORY_CARE_PROVIDER_SITE_OTHER): Payer: Medicare Other | Admitting: Internal Medicine

## 2021-06-09 VITALS — BP 128/76 | HR 78 | Ht 63.0 in | Wt 247.0 lb

## 2021-06-09 DIAGNOSIS — E1159 Type 2 diabetes mellitus with other circulatory complications: Secondary | ICD-10-CM

## 2021-06-09 DIAGNOSIS — Z794 Long term (current) use of insulin: Secondary | ICD-10-CM | POA: Diagnosis not present

## 2021-06-09 DIAGNOSIS — E1139 Type 2 diabetes mellitus with other diabetic ophthalmic complication: Secondary | ICD-10-CM | POA: Diagnosis not present

## 2021-06-09 DIAGNOSIS — E1165 Type 2 diabetes mellitus with hyperglycemia: Secondary | ICD-10-CM | POA: Diagnosis not present

## 2021-06-09 LAB — POCT GLYCOSYLATED HEMOGLOBIN (HGB A1C): Hemoglobin A1C: 8.3 % — AB (ref 4.0–5.6)

## 2021-06-09 MED ORDER — INSULIN LISPRO 100 UNIT/ML IJ SOLN: INTRAMUSCULAR | 3 refills | Status: DC

## 2021-06-09 NOTE — Progress Notes (Signed)
Name: Demonica Annon  Age/ Sex: 65 y.o., female   MRN/ DOB: [id number], 1956/08/26     PCP: [name], MD   Reason for Endocrinology Evaluation: Type 2 Diabetes Mellitus  Initial Endocrine Consultative Visit: 03/31/2019    PATIENT IDENTIFIER: Ms. Glen Jow is a 65 y.o. female with a past medical history of T2DM, HTN, dyslipidemia,thalassemia, OSA on CPAP . The patient has followed with Endocrinology clinic since 03/31/2019 for consultative assistance with management of her diabetes.  DIABETIC HISTORY:  Ms. Bowlby was diagnosed with T2DM in 2010, She has been on metformin in the past with reported prior intolerance due to diarrhea. Was on Glipizide at some point with no side effects.Has been on insulin for years. Her hemoglobin A1c has ranged from  7.7% in 2018, peaking at 10.4% in 2020.  On her initial presentation to our clinic, her A1c was 10.4% , she was on Lantus and Victoza    [name] was tried in 03/2019 but developed severe dizziness .     Injectable methotrexate was started November 2020 due to rheumatoid arthritis.  Patient is intolerant to oral methotrexate   Started on Omnipod 10/2020  SUBJECTIVE:   During the last visit (03/18/2021): A1c 8.0 % we continued  Lantus and  Victoza, restarted Humalog      Today (06/09/2021): Ms. Pfieffer is here for a month follow up on diabetes management. She has the Omnipod. But was recently discharged from the hospital and this disrupted the pump use.  She was admitted for pneumonitis due to MTX  . She is on O2 in 03/2021  Was seen by Neurology for left parietal lobe volume loss  05/2021 Saw cardiology 05/18/2021 She continues to be on prednisone taper     She checks her blood sugars multiple times daily through CGM. The patient has  had hypoglycemic episodes since the last clinic visit.   This patient with type 2 diabetes is treated with Omnipod (insulin pump). During the visit the pump basal and bolus  doses were reviewed including carb/insulin rations and supplemental doses. The clinical list was updated. The glucose meter download was reviewed in detail to determine if the current pump settings are providing the best glycemic control without excessive hypoglycemia.  Pump and meter download:    Pump   Omnipod Settings   Insulin type   Humalog    Basal rate       0000  2.0 u/h               I:C ratio       0000 1:10                  Sensitivity       0000  20      Goal       0000  110            Type & Model of Pump: Omnipod Insulin Type: Currently using Humalog .  PUMP STATISTICS: Average BG: 299 BG Readings: 3.1/day Average Daily Carbs (g): 87 Average Total Daily Insulin: 77.1 Average Daily Basal: 42.3(59 %) Average Daily Bolus: 29.3 (41%)       HOME DIABETES REGIMEN:  Victoza 1.8 mg daily  Humalog     CONTINUOUS GLUCOSE MONITORING RECORD INTERPRETATION    Dates of Recording: 8/10-8/23/2022  Sensor description:Dexcom  Results statistics:   CGM use % of time 93  Average and SD 243/91  Time in range  30 %  % Time  Above 180 21  % Time above 250 48  % Time Below target 0    Glycemic patterns summary: Hyperglycemia is noted most of the day and trends down at night  Hyperglycemic episodes  Post prandial  Hypoglycemic episodes occurred no   Overnight periods: trends down    DIABETIC COMPLICATIONS: Microvascular complications:  Retinopathy (bleeding in the eye )  Denies: CKD, neuropathy  Last eye exam: Completed 08/19/2020   Macrovascular complications:  CAD Denies: PVD, CVA       HISTORY:  Past Medical History:  Past Medical History:  Diagnosis Date   Anxiety    Arthritis    "knees, wrists, back, elbows" (07/03/2017)   Clotting disorder ([place])    blood clot in eye 2016   Coronary artery disease, non-occlusive    a. 11/2016 NSTEMI/Cath: LM nl, LAD 40p, 23m, D1/2 small, LCX 40ost, OM2/3 nl/small, RCA 35 ost/mid, RPDA/RPL  small/nl.   Coronary vasospasm (HCC)    Depression    Diastolic dysfunction    a. 11/2016 Echo: EF 55-60%, gr1 DD, sev calcified MV annulus, mildly dil LA.   Eye hemorrhage 01/2015   "right; resolved" (07/03/2017)   Headache    "monthly" (07/03/2017)   Heart murmur    History of blood transfusion    "when I had an ectopic pregnancy"   History of stomach ulcers    Hyperlipidemia    Hypertension    Microcytic anemia    Mitral stenosis    OSA on CPAP    setting is unknown   PAT (paroxysmal atrial tachycardia) (HCC)    Pneumonia    "several times" (07/03/2017)   Premature atrial contractions    PVC's (premature ventricular contractions)    Syncope 07/03/2017 X 2   called seizures but no medications   Thalassemia    "my cells are sickle cell shaped but I don't have sickle cell anemia" (07/03/2017)   [place] II diabetes mellitus [place]    Past Surgical History:  Past Surgical History:  Procedure Laterality Date   48-Hour Monitor  03/18/2017   Sinus rhythm with sinus tachycardia (rate 58-134 BPM)multiple PVCs noted with couplets and bigeminy. One triplet. 7 runs of PAT ranging from 100-130 bpm. Longest was 33 beats.   BIOPSY  04/03/2021   Procedure: BIOPSY;  Surgeon: AYetta Flock MD;  [facility];  Service: Gastroenterology;;   BREAST BIOPSY Left    BRONCHIAL WASHINGS  03/28/2021   Procedure: BRONCHIAL WASHINGS;  Surgeon: CJulian Hy DO;  [facility]  Service: Endoscopy;;   CARDIAC CATHETERIZATION N/A 11/19/2016   Procedure: Left Heart Cath and Coronary Angiography;  Surgeon: HBelva Crome MD;  [facility];  Service: Cardiovascular.    LM nl, LAD 40p, 537m D1/2 small, LCX 40ost, OM2/3 nl/small, RCA 35 ost/mid, RPDA/RPL small/nl.   CARPAL TUNNEL RELEASE Right 11/01/2014   COLONOSCOPY     DILATION AND CURETTAGE OF UTERUS     ECTOPIC PREGNANCY SURGERY  X 2   ESOPHAGOGASTRODUODENOSCOPY (EGD) WITH PROPOFOL N/A 04/03/2021   Procedure:  ESOPHAGOGASTRODUODENOSCOPY (EGD) WITH PROPOFOL;  Surgeon: ArYetta FlockMD;  [facility];  Service: Gastroenterology;  Laterality: N/A;   JOINT REPLACEMENT     KNEE ARTHROSCOPY Left 11/2014   TEE WITHOUT CARDIOVERSION N/A 03/28/2021   Procedure: TRANSESOPHAGEAL ECHOCARDIOGRAM (TEE);  Surgeon: NaAcie FredricksonhWonda ChengMD;  [facility] Service: Cardiovascular;  Laterality: N/A;   TONSILLECTOMY     TOTAL KNEE ARTHROPLASTY Right 02/18/2015  Procedure: RIGHT TOTAL KNEE ARTHROPLASTY;  Surgeon: Netta Cedars, MD;  Location: [place];  Service: Orthopedics;  Laterality: Right;   TOTAL KNEE ARTHROPLASTY Left 08/19/2015   Procedure: LEFT TOTAL KNEE ARTHROPLASTY;  Surgeon: Netta Cedars, MD;  Location: [place];  Service: Orthopedics;  Laterality: Left;   TRANSTHORACIC ECHOCARDIOGRAM  11/2016; 07/2017:   a) normal LV size, thickness and function. EF 55-60%. GR 1 DD.No RWMA severe mitral annular calcification, but no mitral stenosis. Mild LA dilation;; b) normal LV size and function.  EF 65-75%.  Unable to assess diastolic function.  Aortic sclerosis with no stenosis.  Moderate mitral stenosis? (Gradient 9 mmHg) -?  Mild-mod left atrial enlargement    TRANSTHORACIC ECHOCARDIOGRAM  01/2019   EF 65 to 70%.  Normal function.  Elevated LVEDP-GR 1 DD.  Normal RV size and function.  Large calcific mass on posterior mitral leaflet mild to moderate mitral stenosis.   VAGINAL HYSTERECTOMY     VIDEO BRONCHOSCOPY N/A 03/28/2021   Procedure: VIDEO BRONCHOSCOPY WITHOUT FLUORO;  Surgeon: Julian Hy, DO;  [facility];  Service: Endoscopy;  Laterality: N/A;   Social History:  reports that she quit smoking about 6 years ago. Her smoking use included cigarettes. She has a 11.00 pack-year smoking history. She has never used smokeless tobacco. She reports that she does not currently use alcohol. She reports that she does not use drugs. Family History:  Family History  Problem Relation Age of Onset    Cancer Mother    Diabetes Mother    Hypertension Mother    Hyperlipidemia Mother    Cancer Father    Hyperlipidemia Father    Mental illness Sister    Diabetes Sister    Diabetes Maternal Grandmother    Diabetes Maternal Grandfather    Colon cancer Neg Hx    Esophageal cancer Neg Hx    Stomach cancer Neg Hx    Rectal cancer Neg Hx      HOME MEDICATIONS: Allergies as of 06/09/2021       Reactions   Penicillins Hives   Childhood allergy Has patient had a PCN reaction causing immediate rash, facial/tongue/throat swelling, SOB or lightheadedness with hypotension: Yes Has patient had a PCN reaction causing severe rash involving mucus membranes or skin necrosis: No Has patient had a PCN reaction that required hospitalization No Has patient had a PCN reaction occurring within the last 10 years: No If all of the above answers are "NO", then may proceed with Cephalosporin use.   Metformin And Related Other (See Comments)   Diarrhea, bleeding   Farxiga [dapagliflozin] Other (See Comments)   Dizzy and lethargic        Medication List        Accurate as of June 09, 2021  4:27 PM. If you have any questions, ask your nurse or doctor.          acetaminophen 500 MG tablet Commonly known as: TYLENOL Take 500 mg by mouth every 6 (six) hours as needed for fever.   albuterol 108 (90 Base) MCG/ACT inhaler Commonly known as: VENTOLIN HFA Inhale 2 puffs into the lungs every 6 (six) hours as needed for wheezing or shortness of breath.   Anoro Ellipta 62.5-25 MCG/INH Aepb Generic drug: umeclidinium-vilanterol Inhale 1 puff into the lungs daily.   atorvastatin 80 MG tablet Commonly known as: LIPITOR TAKE 1 TABLET(80 MG) BY MOUTH DAILY AT 6 PM   B-D UF III MINI PEN NEEDLES 31G X 5 MM Misc Generic drug:  Insulin Pen Needle USE DAILY WITH VICTOZA AND LANTUS.   Insulin Pen Needle 31G X 5 MM Misc 1 Device by Does not apply route 3 (three) times daily.   cetirizine 10 MG  tablet Commonly known as: ZYRTEC Take 10 mg by mouth daily as needed for allergies.   Contour Next Test test strip Generic drug: glucose blood 3x daily   Dexcom G6 Sensor Misc 1 Device by Does not apply route as directed.   Dexcom G6 Transmitter Misc 1 Device by Does not apply route as directed.   diltiazem 240 MG 24 hr capsule Commonly known as: CARDIZEM CD Take 1 capsule (240 mg total) by mouth daily.   Flovent Diskus 50 MCG/BLIST diskus inhaler Generic drug: fluticasone Inhale 1 puff by mouth  into the lungs 2 (two) times daily.   folic acid 1 MG tablet Commonly known as: FOLVITE Take 2 tablets (2 mg total) by mouth daily.   insulin lispro 100 UNIT/ML injection Commonly known as: HUMALOG Inject 8 Units into the skin See admin instructions. Back up for pump.   isosorbide mononitrate 30 MG 24 hr tablet Commonly known as: IMDUR Take 30 mg  tablet as needed for Chest pain spasm episodes for 3 days and the stop   Lancets 30G Misc 1 Device by Does not apply route 3 (three) times daily.   Lantus SoloStar 100 UNIT/ML Solostar Pen Generic drug: insulin glargine INJECT 54 UNITS INTO THE SKIN DAILY.   losartan 100 MG tablet Commonly known as: COZAAR Take 1 tablet (100 mg total) by mouth daily.   multivitamin with minerals Tabs tablet Take 1 tablet by mouth daily.   Omnipod DASH Pods (Gen 4) Misc Inject 1 Dose into the skin continuous. Using pump - depends on carb intake.   pantoprazole 40 MG tablet Commonly known as: Protonix Take 1 tablet (40 mg total) by mouth 2 (two) times daily.   predniSONE 10 MG tablet Commonly known as: DELTASONE Take 3 tablets (30 mg total) by mouth daily with breakfast for 14 days, THEN 2 tablets (20 mg total) daily with breakfast for 14 days, THEN 1 tablet (10 mg total) daily with breakfast for 14 days, THEN 0.5 tablets (5 mg total) daily with breakfast for 14 days, THEN 0.5 tablets (5 mg total) every Monday, Wednesday, and Friday for 14  days. Start taking on: April 26, 2021   SAFETY-LOK TB SYRINGE 27GX.5" 27G X 1/2" 1 ML Misc Generic drug: TUBERCULIN SYR 1CC/27GX1/2"   traZODone 50 MG tablet Commonly known as: DESYREL TAKE 0.5-1 TABLETS BY MOUTH AT BEDTIME AS NEEDED FOR SLEEP.   Victoza 18 MG/3ML Sopn Generic drug: liraglutide INJECT 1.8 MG UNDER THE SKIN ONCE DAILY         OBJECTIVE:   Vital Signs: BP 128/76   Pulse 78   Ht '5\' 3"'$  (1.6 m)   Wt 247 lb (112 kg)   LMP  (LMP Unknown)   SpO2 99%   BMI 43.75 kg/m     Wt Readings from Last 3 Encounters:  06/09/21 247 lb (112 kg)  06/08/21 244 lb (110.7 kg)  05/18/21 244 lb (110.7 kg)     Exam: General: Pt appears well on O2 through [place]  Lungs: Clear with good BS bilat   Heart: RRR  Extremities: No pretibial edema  Neuro: MS is good with appropriate affect, pt is alert and Ox3       DM foot exam: 01/30/2021 The skin of the feet is intact without sores or  ulcerations. The pedal pulses are 2+ on right and 2+ on left. The sensation is intact to a screening 5.07, 10 gram monofilament bilaterally    DATA REVIEWED:  Lab Results  Component Value Date   HGBA1C 8.3 (A) 06/09/2021   HGBA1C 8.0 (A) 01/30/2021   HGBA1C 8.5 (A) 09/23/2020   Lab Results  Component Value Date   MICROALBUR 2.3 (H) 01/30/2021   LDLCALC 44 08/22/2020   CREATININE 1.15 (H) 05/18/2021   Lab Results  Component Value Date   MICRALBCREAT 1.4 01/30/2021     Lab Results  Component Value Date   CHOL 97 08/22/2020   HDL 38.80 (L) 08/22/2020   LDLCALC 44 08/22/2020   LDLDIRECT 131 (H) 09/02/2013   TRIG 74.0 08/22/2020   CHOLHDL 3 08/22/2020       Results for [name] "PAT" (MRN [id number]) as of 01/31/2021 11:34  Ref. Range 01/30/2021 15:46  Creatinine,U Latest Units: mg/dL 170.0  Microalb, Ur Latest Ref Range: 0.0 - 1.9 mg/dL 2.3 (H)  MICROALB/CREAT RATIO Latest Ref Range: 0.0 - 30.0 mg/g 1.4    ASSESSMENT / PLAN / RECOMMENDATIONS:   1) Type 2  Diabetes Mellitus, Poorly controlled, With retinopathy and macrovascular complications - Most recent A1c of 8.3 %. Goal A1c <7.0 %.     - She continues to be on prednisone for MTX pneumonitis, hence persistent hyperglycemia.   -She is doing better with entering carbohydrates -Unfortunately it appears that at times instead of entering carbohydrates she enter it as BG reading, and the pump download shows hypoglycemia 38 and 28 at 2 two different times  but in  review of her CGM there was no hypoglycemic episodes, I discussed the importance of making sure she is entering BG's and carbohydrates at the correct allocated section of the pump -I am going to increase her basal rate during the day and adjust her sensitivity factor    MEDICATIONS: Continue Victoza 1.8 mg daily Humalog per pump  Pump    Omni pod Settings   Insulin type    Humalog   Basal rate       0000-09:30 2.00   09:30 - 0000 2.5           I:C ratio       0000-0600  1:10                   Sensitivity       0000  15      Goal       0000  100- 110         EDUCATION / INSTRUCTIONS: BG monitoring instructions: Patient is instructed to check her blood sugars 3 times a day, fasting , supper and bedtime. Call [facility] End[facility] clinic if: BG persistently < 70  I reviewed the Rule of 15 for the treatment of hypoglycemia in detail with the patient. Literature supplied.  I spent 25 minutes preparing to see the patient by review of recent labs, imaging and procedures, obtaining and reviewing separately obtained history, communicating with the patient, ordering medications, and documenting clinical information in the EHR including the differential Dx, treatment, and any further evaluation and other management   F/U in 3 months    Signed electronically by: Mack Guise, MD  [facility] Endocrinology  [facility] [address] [place], [place] [address] Phone: [phone] FAX:  [phone]   CC: [name], [address] [place] [address]  Phone: [phone]  Fax: [phone]  Return to Endocrinology clinic as below: Future Appointments  Date Time Provider [place]  06/12/2021  4:30 PM GI-BCG MM 3 GI-BCGMM GI-BREAST CE  06/23/2021  1:30 PM LBCT-CT 1 LBCT-CT LB-CT [facility]  06/28/2021  2:15 PM Brand Males, MD LBPU-PULCARE None  07/05/2021  4:30 PM Leonie Man, MD CVD-NORTHLIN [facility]  07/07/2021  1:50 PM Ladene Artist, MD LBGI-GI LBPCGastro  08/09/2021  3:20 PM Zulma Court, Melanie Crazier, MD LBPC-LBENDO None  08/23/2021  2:00 PM Copland, Gay Filler, MD LBPC-SW PEC  09/11/2021  2:40 PM Jaelyn Bourgoin, Melanie Crazier, MD LBPC-LBENDO None  06/14/2022  1:00 PM LBPC-SW HEALTH COACH LBPC-SW PEC

## 2021-06-09 NOTE — Patient Instructions (Addendum)
  Pump    Omni pod Settings   Insulin type    Humalog   Basal rate       0000-09:30 2.00   09:30 - 0000 2.5           I:C ratio       0000-0600  1:10                   Sensitivity       0000  15      Goal       0000  110

## 2021-06-12 ENCOUNTER — Ambulatory Visit
Admission: RE | Admit: 2021-06-12 | Discharge: 2021-06-12 | Disposition: A | Discharge status: Home / Self Care | Payer: Medicare Other | Source: Ambulatory Visit | Attending: Family Medicine | Admitting: Family Medicine

## 2021-06-12 ENCOUNTER — Other Ambulatory Visit: Payer: Self-pay

## 2021-06-12 DIAGNOSIS — Z1231 Encounter for screening mammogram for malignant neoplasm of breast: Secondary | ICD-10-CM | POA: Diagnosis not present

## 2021-06-13 DIAGNOSIS — Z794 Long term (current) use of insulin: Secondary | ICD-10-CM | POA: Diagnosis not present

## 2021-06-13 DIAGNOSIS — E1165 Type 2 diabetes mellitus with hyperglycemia: Secondary | ICD-10-CM | POA: Diagnosis not present

## 2021-06-23 ENCOUNTER — Other Ambulatory Visit: Payer: Self-pay

## 2021-06-23 ENCOUNTER — Ambulatory Visit (INDEPENDENT_AMBULATORY_CARE_PROVIDER_SITE_OTHER)
Admission: RE | Admit: 2021-06-23 | Discharge: 2021-06-23 | Disposition: A | Discharge status: Home / Self Care | Payer: Medicare Other | Source: Ambulatory Visit | Attending: Primary Care | Admitting: Primary Care

## 2021-06-23 DIAGNOSIS — I700 Atherosclerosis of aorta: Secondary | ICD-10-CM | POA: Diagnosis not present

## 2021-06-23 DIAGNOSIS — J849 Interstitial pulmonary disease, unspecified: Secondary | ICD-10-CM | POA: Diagnosis not present

## 2021-06-28 ENCOUNTER — Encounter: Payer: Self-pay | Admitting: Internal Medicine

## 2021-06-28 ENCOUNTER — Other Ambulatory Visit: Payer: Self-pay

## 2021-06-28 ENCOUNTER — Ambulatory Visit: Payer: Medicare Other | Admitting: Internal Medicine

## 2021-06-28 VITALS — BP 130/80 | HR 79 | Temp 98.0°F | Ht 63.0 in | Wt 251.6 lb

## 2021-06-28 DIAGNOSIS — J9602 Acute respiratory failure with hypercapnia: Secondary | ICD-10-CM

## 2021-06-28 DIAGNOSIS — R0609 Other forms of dyspnea: Secondary | ICD-10-CM

## 2021-06-28 DIAGNOSIS — J849 Interstitial pulmonary disease, unspecified: Secondary | ICD-10-CM

## 2021-06-28 DIAGNOSIS — R0600 Dyspnea, unspecified: Secondary | ICD-10-CM

## 2021-06-28 NOTE — Patient Instructions (Addendum)
ICD-10-CM   1. Acute respiratory failure with hypercapnia (HCC)  J96.02     2. ILD (interstitial lung disease) ([place])  J84.9     3. Dyspnea on exertion  R06.00       Clinically better and also CT scan with significant improvement but perhaps with some mild residual scar tissue.  Walking desaturation test on room I suggest you do not need oxygen when he is sitting and doing nothing and also walking of 100-200 feet  However, you are extremely short of breath and this is probably because of weight and physical deconditioning and the moderate-severe mitral stenosis that you have  Recommendations: Stop prednisone per protocol next week Stop  Bactrim DS daily  Continue Protonix twice a day  Use incentive spirometer 10x/hour a couple times a day  Check oxygen room air overnight for nocturnal pulse oximetry and if this is normal you do not need oxygen at night  -Other than the night of testing till further notice Contineu supplemental oxygen 2L at night to maintain >88-90% Make sure you keep your appointment with Dr. Glenetta Hew cardiologist in September 2022 for mitral stenosis  -Ask him it is safe to do cardiac or pulmonary rehabilitation to improve your shortness of breath Encouraged to discuss alternative options to methotrexate with her rheumatologist.  Follow-up: Return to see Dr. Chase Caller or nurse practitioner in 6-8 weeks in a 15-minute slot -symptom score and walking desaturation test at follow-up

## 2021-06-28 NOTE — Progress Notes (Signed)
_0  ID: Lynn [name], female    DOB: [birth year], 65 y.o.   MRN: [id number]  Chief Complaint  Patient presents with   Follow-up    06-09/22, She reports having trouble catching her breath at times. She has clear sputum at times.      04/02/21 PCCM Hospital consult -Dr. Chase Caller 65 year old female, former smoker quit in May 2016 (11-pack-year history).  Past medical history significant for rheumatoid arthritis on methotrexate, hypertension, heart failure with preserved ejection fraction, type 2 diabetes, chronic anemia thalassemia. Former patient of Dr. Melvyn Novas, last seen in office on 01/14/2018.  65 year old woman with PMHx significant for HTN, HFpEF (Echo 03/2021 EF 55-60%), T2DM, chronic anemia 2/2 thalassemia, osteoarthritis (s/p bilateral TKA) and rheumatoid arthritis (on methotrexate) admitted to [facility] 03/23/21 with acute hypoxemic respiratory failure.   Patient was recently treated with doxycycline and azithromycin for presumed bronchitis. Febrile this admission to Tmax of 101.24F with dry cough. CTA Chest 6/7 demonstrating scattered diffuse areas of ground glass attenuation, negative for pulmonary emboli. COVID and influenza PCRs negative.   PCCM consulted for further evaluation/recommendations for respiratory failure and fevers.  Admitted 03/23/2021 6/11 with increasing oxygen needs to 5L 6/14 - Bronch BAL - 64% lymphs, 32% mono, Cytology without malig cells. Culture negative as of 6/16.  Started Solu-Medrol.  Gives history of methotrexate injection and also feather pillow exposure 6/16 - CT Chest completed with GGOs, negative for PE. Cardiac MRI demonstrating mitral annular mass c/w degenerative mitral annular calcification. Melena noted in PM, FOBT+. 6/17 - GI consulted for melena/Hgb drift. Plan for EGD pending pulmonary clearance for procedure. Ceftriaxone started.  3 L oxygen need    04/26/2021 - PV Patient presents today for hospital follow-up. She was admitted on 03/23/21  for acute hypoxic respiratory failure secondary to interstitial lung disease.  Patient underwent bronchoscopy on 6/14 showed predominant lymphocytosis greater than 60% on BAL, felt to be consistent with methotrexate hypersensitivity pneumonitis or feather pillow hypersensitivity pneumonitis or combination; rarely it can be ill IP from rheumatoid arthritis. CT chest on 03/30/2021 showed marked increase peribronchovascular groundglass and upper/mid lung zone with interstitial thickening and scattered collapse/consolidation. Suggestive of alternative diagnosis (not UIP).  Infectious disease was consulted on 03/31/2021 recommending Bactrim prophylactically if on prednisone greater than 20 mg/day for 2 months.  Today she is doing some better. She has an occasional dry cough, recently started coughing up some clear mucus.  Patient states that she never in fact had a feather pillow.  She has been encouraged to discuss alternative options to methotrexate with her rheumatologist.  She is currently on slow prednisone taper for the next 2 to 3 months started on 04/03/21 week 25 of 2022 (40 mg/day x 2 weeks; 30 mg/day x 2 weeks; 20 mg/day x 2 weeks; 10 mg/day x 2 weeks; 5 mg/day x 2 weeks and then 5 mg Monday Wednesday Friday x2 weeks). She is taking Bactrim DS daily per ID. She is also taking Protonix twice a day. She is using 2L oxygen with exertion and at night.   Results for Lynn, SANGIOVANNI "PAT" (MRN [id number]) as of 06/28/2021 15:00  Ref. Range 03/28/2021 14:10  Monocyte-Macrophage-Serous Fluid Latest Ref Range: 50 - 90 % 32 (L)  Fluid Type-FCT Unknown Bronch [place]  Color, Fluid Latest Ref Range: YELLOW  COLORLESS (A)  Total Nucleated Cell Count, Fluid Latest Ref Range: 0 - 1,000 cu mm 91  Lymphs, Fluid Latest Units: % 64  Eos, Fluid Latest Units: % 1  Appearance, Fluid Latest Ref Range: CLEAR  CLEAR (A)  Neutrophil Count, Fluid Latest Ref Range: 0 - 25 % 3    OV 06/28/2021 - week 37  Subjective:   Patient ID: Lynn [name], female , DOB: 1955-12-05 , age 65 y.o. , MRN: [id number] , ADDRESS: [place] [id number] PCP Copland, Gay Filler, MD Patient Care Team: Copland, Gay Filler, MD as PCP - General (Family Medicine) Leonie Man, MD as PCP - Cardiology (Cardiology)  This Provider for this visit: Treatment Team:  Attending Provider: Brand Males, MD    06/28/2021 -   Chief Complaint  Patient presents with   Follow-up    Pt states she has been doing okay since last visit. States her breathing is about the same. States she has occ chest pains.   65 year old with rheumatoid arthritis on methotrexate.  Admitted June 2022 for acute hypoxemic infiltrates respiratory failure with groundglass opacities.  BAL positive for greater than 60% lymphocytes.  Differential diagnose of methotrexate related lung injury versus feather pillow hypersensitive pneumonitis.  Started on 10-week prednisone taper at discharge along with Bactrim.  She is known to have moderate to severe mitral stenosis on TEE June 2022.  Also morbid obesity  HPI Lynn [name] 65 y.o. -returns for follow-up.  At this point in time she is supposed to be done with prednisone but for some reason she still on prednisone although next week should be done with it.  She was discharged on 4 L nasal cannula.  She continues to use oxygen at 3 L nasal cannula at rest and with exertion.  Her current symptom score is below.  Overall she does feel improved compared to when she was in the hospital although it is unclear whether she is improved compared to 1 month ago.  She does still feel some chest tightness and still has some cough.  Her current symptom score and walk test is below.Here walking desaturation test and got severely symptomatic but did not desaturate.  This is an improvement.  She has upcoming appoint with Dr. Glenetta Hew and cardiology for mitral stenosis.  SYMPTOM SCALE -  ILD 06/28/2021  Current weight 251#  O2 use 3LNC  Shortness of Breath 0 -> 5 scale with 5 being worst (score 6 If unable to do)  At rest 3  Simple tasks - showers, clothes change, eating, shaving 4  Household (dishes, doing bed, laundry) 4  Shopping 4  Walking level at own pace 4  Walking up Stairs 4  Total (30-36) Dyspnea Score 23  How bad is your cough? 3  How bad is your fatigue Did not answer  How bad is nausea 0  How bad is vomiting?  0  How bad is diarrhea? 2  How bad is anxiety? 4  How bad is depression 3  Any chronic pain - if so where and how bad Did nto sak   Simple office walk 185 feet x  3 laps goal with forehead probe 06/28/2021    O2 used ra   Number laps completed Didonly 2 of 3   Comments about pace slow   Resting Pulse Ox/HR 100% and 77/min   Final Pulse Ox/HR 98% and 99/min   Desaturated </= 88% no   Desaturated <= 3% points no   Got Tachycardic >/= 90/min yes   Symptoms at end of test Modreate tos evere shortness of breath   Miscellaneous comments        CT Chest  data HRCT 06/23/21   Narrative & Impression  CLINICAL DATA:  65 year old female with history of shortness of breath with exertion. Evaluate for interstitial lung disease.   EXAM: CT CHEST WITHOUT CONTRAST   TECHNIQUE: Multidetector CT imaging of the chest was performed following the standard protocol without intravenous contrast. High resolution imaging of the lungs, as well as inspiratory and expiratory imaging, was performed.   COMPARISON:  Chest CT 03/30/2021.   FINDINGS: Cardiovascular: Heart size is normal. There is no significant pericardial fluid, thickening or pericardial calcification. There is aortic atherosclerosis, as well as atherosclerosis of the great vessels of the mediastinum and the coronary arteries, including calcified atherosclerotic plaque in the left main, left anterior descending, left circumflex and right coronary arteries. Severe calcifications of the  mitral valve and annulus.   Mediastinum/Nodes: No pathologically enlarged mediastinal or hilar lymph nodes. Please note that accurate exclusion of hilar adenopathy is limited on noncontrast CT scans. Esophagus is unremarkable in appearance. No axillary lymphadenopathy.   Lungs/Pleura: When compared to prior examination from 03/30/2021, much of the previously noted parenchymal lung changes have resolved. There continues to be some patchy areas of ground-glass attenuation, septal thickening and mild subpleural reticulation scattered throughout the lungs bilaterally, with no discernible craniocaudal gradient. No traction bronchiectasis or frank honeycombing. Inspiratory and expiratory imaging demonstrates some mild air trapping indicative of mild small airways disease. No acute consolidative airspace disease. No pleural effusions. No definite suspicious appearing pulmonary nodules or masses are noted.   Upper Abdomen: Aortic atherosclerosis.   Musculoskeletal: There are no aggressive appearing lytic or blastic lesions noted in the visualized portions of the skeleton.   IMPRESSION: 1. The appearance of the lungs is compatible with interstitial lung disease, but the spectrum of findings is most indicative of an alternative diagnosis (not usual interstitial pneumonia) per current ATS guidelines. Specifically, findings are favored to reflect mild post infectious or inflammatory fibrosis, and are significantly improved compared to the prior examination. 2. Aortic atherosclerosis, in addition to left main and 3 vessel coronary artery disease. Please note that although the presence of coronary artery calcium documents the presence of coronary artery disease, the severity of this disease and any potential stenosis cannot be assessed on this non-gated CT examination. Assessment for potential risk factor modification, dietary therapy or pharmacologic therapy may be warranted, if clinically  indicated. 3. There are severe bulky calcifications of the mitral valve and annulus. Echocardiographic correlation for evaluation of potential valvular dysfunction may be warranted if clinically indicated.   Aortic Atherosclerosis (ICD10-I70.0).     Electronically Signed   By: Vinnie Langton M.D.   On: 06/24/2021 08:23      No results found.    PFT  PFT Results Latest Ref Rng & Units 01/14/2018  FVC-Pre L 1.85  FVC-Predicted Pre % 77  FVC-Post L 1.87  FVC-Predicted Post % 78  Pre FEV1/FVC % % 85  Post FEV1/FCV % % 87  FEV1-Pre L 1.57  FEV1-Predicted Pre % 84  FEV1-Post L 1.62  DLCO uncorrected ml/min/mmHg 15.77  DLCO UNC% % 70  DLVA Predicted % 101  TLC L 3.66  TLC % Predicted % 75  RV % Predicted % 81       has a past medical history of Anxiety, Arthritis, Clotting disorder ([place]), Coronary artery disease, non-occlusive, Coronary vasospasm ([place]), Depression, Diastolic dysfunction, Eye hemorrhage (01/2015), Headache, Heart murmur, History of blood transfusion, History of stomach ulcers, Hyperlipidemia, Hypertension, Microcytic anemia, Mitral stenosis, OSA on CPAP, PAT (paroxysmal atrial  tachycardia) ([place]), Pneumonia, Premature atrial contractions, PVC's (premature ventricular contractions), Syncope (07/03/2017 X 2), Thalassemia, and Type II diabetes mellitus ([place]).   reports that she quit smoking about 6 years ago. Her smoking use included cigarettes. She has a 11.00 pack-year smoking history. She has never used smokeless tobacco.  Past Surgical History:  Procedure Laterality Date   48-Hour Monitor  03/18/2017   Sinus rhythm with sinus tachycardia (rate 58-134 BPM)multiple PVCs noted with couplets and bigeminy. One triplet. 7 runs of PAT ranging from 100-130 bpm. Longest was 33 beats.   BIOPSY  04/03/2021   Procedure: BIOPSY;  Surgeon: Yetta Flock, MD;  Location: [facility];  Service: Gastroenterology;;   BREAST BIOPSY Left    BRONCHIAL WASHINGS  03/28/2021    Procedure: BRONCHIAL WASHINGS;  Surgeon: Julian Hy, DO;  Location: [facility];  Service: Endoscopy;;   CARDIAC CATHETERIZATION N/A 11/19/2016   Procedure: Left Heart Cath and Coronary Angiography;  Surgeon: Belva Crome, MD;  Location: [facility];  Service: Cardiovascular.    LM nl, LAD 40p, 55m, D1/2 small, LCX 40ost, OM2/3 nl/small, RCA 35 ost/mid, RPDA/RPL small/nl.   CARPAL TUNNEL RELEASE Right 11/01/2014   COLONOSCOPY     DILATION AND CURETTAGE OF UTERUS     ECTOPIC PREGNANCY SURGERY  X 2   ESOPHAGOGASTRODUODENOSCOPY (EGD) WITH PROPOFOL N/A 04/03/2021   Procedure: ESOPHAGOGASTRODUODENOSCOPY (EGD) WITH PROPOFOL;  Surgeon: AYetta Flock MD;  Location: [facility];  Service: Gastroenterology;  Laterality: N/A;   JOINT REPLACEMENT     KNEE ARTHROSCOPY Left 11/2014   TEE WITHOUT CARDIOVERSION N/A 03/28/2021   Procedure: TRANSESOPHAGEAL ECHOCARDIOGRAM (TEE);  Surgeon: NAcie FredricksonPWonda Cheng MD;  [facility];  Service: Cardiovascular;  Laterality: N/A;   TONSILLECTOMY     TOTAL KNEE ARTHROPLASTY Right 02/18/2015   Procedure: RIGHT TOTAL KNEE ARTHROPLASTY;  Surgeon: SNetta Cedars MD;  [facility]  Service: Orthopedics;  Laterality: Right;   TOTAL KNEE ARTHROPLASTY Left 08/19/2015   Procedure: LEFT TOTAL KNEE ARTHROPLASTY;  Surgeon: SNetta Cedars MD;  [facility]  Service: Orthopedics;  Laterality: Left;   TRANSTHORACIC ECHOCARDIOGRAM  11/2016; 07/2017:   a) normal LV size, thickness and function. EF 55-60%. GR 1 DD.No RWMA severe mitral annular calcification, but no mitral stenosis. Mild LA dilation;; b) normal LV size and function.  EF 65-75%.  Unable to assess diastolic function.  Aortic sclerosis with no stenosis.  Moderate mitral stenosis? (Gradient 9 mmHg) -?  Mild-mod left atrial enlargement    TRANSTHORACIC ECHOCARDIOGRAM  01/2019   EF 65 to 70%.  Normal function.  Elevated LVEDP-GR 1 DD.  Normal RV size and function.  Large calcific mass on posterior  mitral leaflet mild to moderate mitral stenosis.   VAGINAL HYSTERECTOMY     VIDEO BRONCHOSCOPY N/A 03/28/2021   Procedure: VIDEO BRONCHOSCOPY WITHOUT FLUORO;  Surgeon: CJulian Hy DO;  [facility];  Service: Endoscopy;  Laterality: N/A;    Allergies  Allergen Reactions   Penicillins Hives    Childhood allergy Has patient had a PCN reaction causing immediate rash, facial/tongue/throat swelling, SOB or lightheadedness with hypotension: Yes Has patient had a PCN reaction causing severe rash involving mucus membranes or skin necrosis: No Has patient had a PCN reaction that required hospitalization No Has patient had a PCN reaction occurring within the last 10 years: No If all of the above answers are "NO", then may proceed with Cephalosporin use.   Metformin And Related Other (See Comments)  Diarrhea, bleeding   Farxiga [Dapagliflozin] Other (See Comments)    Dizzy and lethargic     Immunization History  Administered Date(s) Administered   Influenza Split 07/22/2012   Influenza, High Dose Seasonal PF 06/10/2019   Influenza,inj,Quad PF,6+ Mos 10/09/2014, 07/13/2015, 11/17/2016, 07/04/2017, 07/10/2018   Influenza-Unspecified 06/20/2020   Moderna Sars-Covid-2 Vaccination 11/27/2019, 12/26/2019, 06/22/2020, 12/30/2020, 06/21/2021   Pneumococcal Conjugate-13 03/23/2019   Pneumococcal Polysaccharide-23 08/22/2020   Pneumococcal-Unspecified 02/12/2010   Tdap 02/12/2010, 10/17/2016   Zoster Recombinat (Shingrix) 02/20/2021    Family History  Problem Relation Age of Onset   Cancer Mother    Diabetes Mother    Hypertension Mother    Hyperlipidemia Mother    Cancer Father    Hyperlipidemia Father    Mental illness Sister    Diabetes Sister    Diabetes Maternal Grandmother    Diabetes Maternal Grandfather    Colon cancer Neg Hx    Esophageal cancer Neg Hx    Stomach cancer Neg Hx    Rectal cancer Neg Hx      Current Outpatient Medications:    acetaminophen  (TYLENOL) 500 MG tablet, Take 500 mg by mouth every 6 (six) hours as needed for fever., Disp: , Rfl:    albuterol (VENTOLIN HFA) 108 (90 Base) MCG/ACT inhaler, Inhale 2 puffs into the lungs every 6 (six) hours as needed for wheezing or shortness of breath., Disp: 8 g, Rfl: 2   atorvastatin (LIPITOR) 80 MG tablet, TAKE 1 TABLET(80 MG) BY MOUTH DAILY AT 6 PM, Disp: 90 tablet, Rfl: 3   cetirizine (ZYRTEC) 10 MG tablet, Take 10 mg by mouth daily as needed for allergies., Disp: , Rfl:    Continuous Blood Gluc Sensor (DEXCOM G6 SENSOR) MISC, 1 Device by Does not apply route as directed., Disp: 3 each, Rfl: 6   Continuous Blood Gluc Transmit (DEXCOM G6 TRANSMITTER) MISC, 1 Device by Does not apply route as directed., Disp: 1 each, Rfl: 3   diltiazem (TIAZAC) 240 MG 24 hr capsule, Take by mouth., Disp: , Rfl:    folic acid (FOLVITE) 1 MG tablet, Take 2 tablets (2 mg total) by mouth daily., Disp: 180 tablet, Rfl: 3   glucose blood (CONTOUR NEXT TEST) test strip, 3x daily, Disp: 300 each, Rfl: 11   Insulin Disposable Pump (OMNIPOD DASH PODS, GEN 4,) MISC, Inject 1 Dose into the skin continuous. Using pump - depends on carb intake., Disp: , Rfl:    insulin lispro (HUMALOG) 100 UNIT/ML injection, Max Daily 100 units per pump, Disp: 90 mL, Rfl: 3   Insulin Pen Needle (B-D UF III MINI PEN NEEDLES) 31G X 5 MM MISC, USE DAILY WITH VICTOZA AND LANTUS., Disp: 400 each, Rfl: 1   Insulin Pen Needle 31G X 5 MM MISC, 1 Device by Does not apply route 3 (three) times daily., Disp: 150 each, Rfl: 11   isosorbide mononitrate (IMDUR) 30 MG 24 hr tablet, Take 30 mg  tablet as needed for Chest pain spasm episodes for 3 days and the stop (Patient taking differently: Take 30 mg  tablet as needed for Chest pain spasm episodes for 3 days and the stop), Disp: 20 tablet, Rfl: 7   Lancets 30G MISC, 1 Device by Does not apply route 3 (three) times daily., Disp: 300 each, Rfl: 9   losartan (COZAAR) 100 MG tablet, Take by mouth., Disp: ,  Rfl:    Multiple Vitamin (MULTIVITAMIN WITH MINERALS) TABS tablet, Take 1 tablet by mouth daily., Disp: , Rfl:  predniSONE (DELTASONE) 10 MG tablet, Take 3 tablets (30 mg total) by mouth daily with breakfast for 14 days, THEN 2 tablets (20 mg total) daily with breakfast for 14 days, THEN 1 tablet (10 mg total) daily with breakfast for 14 days, THEN 0.5 tablets (5 mg total) daily with breakfast for 14 days, THEN 0.5 tablets (5 mg total) every Monday, Wednesday, and Friday for 14 days., Disp: 94 tablet, Rfl: 0   SAFETY-LOK TB SYRINGE 27GX.5" 27G X 1/2" 1 ML MISC, , Disp: , Rfl:    traZODone (DESYREL) 50 MG tablet, TAKE 0.5-1 TABLETS BY MOUTH AT BEDTIME AS NEEDED FOR SLEEP., Disp: 90 tablet, Rfl: 2   VICTOZA 18 MG/3ML SOPN, INJECT 1.8 MG UNDER THE SKIN ONCE DAILY, Disp: 15 mL, Rfl: 3   diltiazem (CARDIZEM CD) 240 MG 24 hr capsule, Take 1 capsule (240 mg total) by mouth daily., Disp: 90 capsule, Rfl: 3   losartan (COZAAR) 100 MG tablet, Take 1 tablet (100 mg total) by mouth daily., Disp: 90 tablet, Rfl: 3      Objective:   Vitals:   06/28/21 1414  BP: 130/80  Pulse: 79  Temp: 98 F (36.7 C)  TempSrc: Oral  SpO2: 98%  Weight: 251 lb 9.6 oz (114.1 kg)  Height: _0  (1.6 m)    Estimated body mass index is 44.57 kg/m as calculated from the following:   Height as of this encounter: _1  (1.6 m).   Weight as of this encounter: 251 lb 9.6 oz (114.1 kg).  _2 @  Filed Weights   06/28/21 1414  Weight: 251 lb 9.6 oz (114.1 kg)     Physical Exam  General: No distress. obese Neuro: Alert and Oriented x 3. GCS 15. Speech normal Psych: Pleasant Resp:  Barrel Chest - no.  Wheeze - no, Crackles - no, No overt respiratory distress CVS: Normal heart sounds. Murmurs - no Ext: Stigmata of Connective Tissue Disease - no HEENT: Normal upper airway. PEERL +. No post nasal drip        Assessment:       ICD-10-CM   1. Acute respiratory failure with hypercapnia (HCC)  J96.02      2. ILD (interstitial lung disease) ([place])  J84.9     3. Dyspnea on exertion  R06.00          Plan:     Patient Instructions     ICD-10-CM   1. Acute respiratory failure with hypercapnia (HCC)  J96.02     2. ILD (interstitial lung disease) ([place])  J84.9     3. Dyspnea on exertion  R06.00       Clinically better and also CT scan with significant improvement but perhaps with some mild residual scar tissue.  Walking desaturation test on room I suggest you do not need oxygen when he is sitting and doing nothing and also walking of 100-200 feet  However, you are extremely short of breath and this is probably because of weight and physical deconditioning and the moderate-severe mitral stenosis that you have  Recommendations: Stop prednisone per protocol next week Stop  Bactrim DS daily  Continue Protonix twice a day  Use incentive spirometer 10x/hour a couple times a day  Check oxygen room air overnight for nocturnal pulse oximetry and if this is normal you do not need oxygen at night  -Other than the night of testing till further notice Contineu supplemental oxygen 2L at night to maintain >88-90% Make sure you keep your appointment with Dr.  Glenetta Hew cardiologist in September 2022 for mitral stenosis  -Ask him it is safe to do cardiac or pulmonary rehabilitation to improve your shortness of breath Encouraged to discuss alternative options to methotrexate with her rheumatologist.  Follow-up: Return to see Dr. Chase Caller or nurse practitioner in 6-8 weeks in a 15-minute slot -symptom score and walking desaturation test at follow-up  ( Level 05 visit: Estb 40-54 min   in  visit type: on-site physical face to visit  in total care time and counseling or/and coordination of care by this undersigned MD - Dr Brand Males. This includes one or more of the following on this same day 06/28/2021: pre-charting, chart review, note writing, documentation discussion of test results,  diagnostic or treatment recommendations, prognosis, risks and benefits of management options, instructions, education, compliance or risk-factor reduction. It excludes time spent by the [place] or office staff in the care of the patient. Actual time 40 min)   SIGNATURE    Dr. Brand Males, M.D., F.C.C.P,  Pulmonary and Critical Care Medicine Staff Physician, [place] Director - Interstitial Lung Disease  Program  Pulmonary [place] [facility] [place], [place], [address]  Pager: [phone], If no answer or between  15:00h - 7:00h: call [phone] Telephone: [phone]  3:27 PM 06/28/2021

## 2021-06-28 NOTE — Addendum Note (Signed)
Addended by: Lorretta Harp on: 06/28/2021 03:42 PM   Modules accepted: Orders

## 2021-07-05 ENCOUNTER — Encounter: Payer: Self-pay | Admitting: Cardiology

## 2021-07-05 ENCOUNTER — Ambulatory Visit: Payer: Medicare Other | Admitting: Cardiology

## 2021-07-05 ENCOUNTER — Other Ambulatory Visit: Payer: Self-pay

## 2021-07-05 VITALS — BP 115/60 | HR 87 | Ht 64.0 in | Wt 252.4 lb

## 2021-07-05 DIAGNOSIS — I2510 Atherosclerotic heart disease of native coronary artery without angina pectoris: Secondary | ICD-10-CM | POA: Diagnosis not present

## 2021-07-05 DIAGNOSIS — E785 Hyperlipidemia, unspecified: Secondary | ICD-10-CM

## 2021-07-05 DIAGNOSIS — I5032 Chronic diastolic (congestive) heart failure: Secondary | ICD-10-CM

## 2021-07-05 DIAGNOSIS — I480 Paroxysmal atrial fibrillation: Secondary | ICD-10-CM

## 2021-07-05 DIAGNOSIS — E1169 Type 2 diabetes mellitus with other specified complication: Secondary | ICD-10-CM

## 2021-07-05 DIAGNOSIS — J849 Interstitial pulmonary disease, unspecified: Secondary | ICD-10-CM | POA: Diagnosis not present

## 2021-07-05 DIAGNOSIS — R0609 Other forms of dyspnea: Secondary | ICD-10-CM

## 2021-07-05 DIAGNOSIS — I050 Rheumatic mitral stenosis: Secondary | ICD-10-CM

## 2021-07-05 DIAGNOSIS — I471 Supraventricular tachycardia: Secondary | ICD-10-CM

## 2021-07-05 DIAGNOSIS — K921 Melena: Secondary | ICD-10-CM

## 2021-07-05 DIAGNOSIS — I10 Essential (primary) hypertension: Secondary | ICD-10-CM | POA: Diagnosis not present

## 2021-07-05 DIAGNOSIS — I201 Angina pectoris with documented spasm: Secondary | ICD-10-CM | POA: Diagnosis not present

## 2021-07-05 DIAGNOSIS — R0600 Dyspnea, unspecified: Secondary | ICD-10-CM | POA: Diagnosis not present

## 2021-07-05 DIAGNOSIS — Z7409 Other reduced mobility: Secondary | ICD-10-CM

## 2021-07-05 MED ORDER — LOSARTAN POTASSIUM 50 MG PO TABS: 50.0000 mg | ORAL_TABLET | Freq: Every day | ORAL | 3 refills | Status: DC

## 2021-07-05 MED ORDER — DILTIAZEM HCL ER COATED BEADS 360 MG PO CP24: 360.0000 mg | ORAL_CAPSULE | Freq: Every day | ORAL | 3 refills | Status: DC

## 2021-07-05 NOTE — Patient Instructions (Signed)
Medication Instructions:   Increase diltiazem to 360 mg daily   Decrease losartan 50 mg daily   *If you need a refill on your cardiac medications before your next appointment, please call your pharmacy*   Lab Work:  notneeded   Testing/Procedures:  Not needed  Follow-Up: At [facility], you and your health needs are our priority.  As part of our continuing mission to provide you with exceptional heart care, we have created designated Provider Care Teams.  These Care Teams include your primary Cardiologist (physician) and Advanced Practice Providers (APPs -  Physician Assistants and Nurse Practitioners) who all work together to provide you with the care you need, when you need it.     Your next appointment:    Jul 31, 2021  The format for your next appointment:   In Person  Provider:   Glenetta Hew, MD   Other Instructions

## 2021-07-05 NOTE — Progress Notes (Signed)
Primary Care Provider: [name], MD Consulting Cardiologist: Glenetta Hew, MD Endocrinologist: Kelton Pillar, Melanie Crazier, MD Consulting Pulmonologist: Dr. Chase Caller GI: Dr. Fuller Plan Neurosurgery: Dr. Duffy Rhody  Clinic Note: Chief Complaint  Patient presents with   Follow-up    ~6 weeks -- Event Monitor; 2nd post-hospital f/u   Atrial Fibrillation    Still pending GI & NSgx f/u   Mitral Stenosis    Progression on TTE- TEE   Shortness of Breath    Notably worse with exertion, PND & orthopnea   Chest Pain    L upper chest    ===================================  ASSESSMENT/PLAN   Problem List Items Addressed This Visit       Cardiology Problems   Essential hypertension (Chronic)    .Blood pressures well controlled today.  She does not need to be back on the HCTZ in fact with [place] increasing the diltiazem to 360 mg, on the reduced losartan to 50 mg.  Continue Imdur.      Relevant Medications   losartan (COZAAR) 50 MG tablet   diltiazem (CARDIZEM CD) 360 MG 24 hr capsule   Coronary artery disease, non-occlusive (Chronic)    Previous ischemic evaluations have been negative.  She does have significant artery calcification seen on CT scans.  I consider the possibility of coronary CTA versus cardiac catheterization, however with the need to assess severity of mitral valve disease, invasive evaluation with right left heart catheterization is likely a better option.  I would like for her to finish out her prednisone taper and hopefully her pulmonary issues will clear up so we can then assess cardiac issues without the added affect of her pulmonary disease.  Currently on high-dose atorvastatin with well-controlled lipids. She is on diltiazem for presumed spasm and tachycardia spells.  We are titrating this up to 360 mg daily and continuing Imdur.  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      Relevant Medications   losartan (COZAAR) 50 MG tablet   diltiazem (CARDIZEM CD) 360 MG 24 hr capsule   Other Relevant Orders   EKG 12-Lead (Completed)   PAT (paroxysmal atrial tachycardia) (HCC) (Chronic)    Relatively frequent PAT spells noted on monitor.  Short-lived, but still present and somewhat concerning symptom wise especially as they may be triggers for A. Fib.  Plan: Increase diltiazem to 360 mg daily, reduce losartan to 50 mg to allow for change in blood pressure.      Relevant Medications   losartan (COZAAR) 50 MG tablet   diltiazem (CARDIZEM CD) 360 MG 24 hr capsule   Other Relevant Orders   EKG 12-Lead (Completed)   Coronary artery spasm (HCC) (Chronic)    History of coronary spasm, presenting.  With intermittent chest pain episodes now that happening with increased frequency and intensity.  I do think some of the chest pain may be costosternal/costochondritis pain that could really be related to her rheumatoid arthritis.  However, with ongoing chest pain symptoms, persistent dyspnea with now diagnosis of severe mitral stenosis, unlikely considering Right and Left Heart Catheterization at 6-week follow-up.      Relevant Medications   losartan (COZAAR) 50 MG tablet   diltiazem (CARDIZEM CD) 360 MG 24 hr capsule   Chronic diastolic CHF (congestive heart failure) (HCC) (Chronic)    Difficult to tell her volume status.  She seems bloated which is probably more from prednisone.  She is not currently on a diuretic.  She does have orthopnea and PND, but it is difficult to tell especially the lung findings if this is related to her IPF or true CHF.  As part of her mitral valve evaluation, anticipate right and left heart catheterization.      Relevant Medications   losartan (COZAAR) 50 MG tablet   diltiazem (CARDIZEM CD) 360 MG 24 hr capsule   Other Relevant Orders   EKG 12-Lead (Completed)   Moderate mitral stenosis by prior echocardiography -  Primary (Chronic)    Previously diagnosed with mild to moderate mitral stenosis-calcific.  Now on recent echo gradient was increased to 9 mmHg consider be moderate and then the TEE suggested moderate to severe with a mean gradient of 14 mmHg.  I am concerned with having mitral stenosis and PAF that she could potentially decompensate where she is to go into prolonged A. fib.  She does already have evidence of HFpEF which would only be exacerbated.  Unfortunately, with her ongoing pulmonary issues/IPF, she would not be a good candidate for surgical referral.  Also, with MAC related MS, MV replacement would not necessarily be favorable.  Plan: Need to discuss results of TTE, TEE and CMR with noninvasive colleagues to confirm that indeed there is significant MS that would potentially warrant surgical valve replacement. With her having chest pain now and worsening dyspnea, would also consider the possibility of Right and Left Heart Catheterization as part of the evaluation of mitral valve severity as well as any potential CAD given that she has significant coronary calcification on CT scan. Prior to considering cardiac catheterization, need to know that she can be started on DOAC plus or minus Plavix I plan to see her back in about 6 weeks at which time we should have an answer from both GI and neurosurgery.  Can also reassess her dyspnea symptoms.   At that time, would likely plan to schedule right left heart catheterization as part of a diagnostic evaluation prior to referral to outside surgical center where a dedicated mitral valve surgeon is available..      Relevant Medications   losartan (COZAAR) 50 MG tablet   diltiazem (CARDIZEM CD) 360 MG 24 hr capsule   PAF (paroxysmal atrial fibrillation) (HCC) (Chronic)    A. fib confirmed on monitor, at least 1% burden.  Not a lot, but still present.  CHA2DS2-VASc score is 6, therefore I would like for her to be on a DOAC, but I am leery of starting until we  have confirmed stabilization of her PUD and confirmation that there is no concern of bleeding from her dural AVMs.  She is due to actually have a CT angiogram of the head on 07/06/2021 followed by neurosurgery consult on 07/07/2021.  I am hoping that we get an answer as to whether or not we can safely start [place].  Rates did not seem to be going that fast, but she is having PAT spells that are up faster.  Plan: For now continue rate control with diltiazem but increase to 360 mg daily.      Relevant Medications   losartan (COZAAR) 50 MG tablet   diltiazem (CARDIZEM CD) 360 MG 24 hr capsule   Other Relevant Orders   EKG 12-Lead (Completed)     Other   Mobility impaired    She is not very active, and some exertional dyspnea may simply be related to deconditioning, obesity but also RA related joint pains.  ILD (interstitial lung disease) ([place])   Melena   Hyperlipidemia associated with type 2 diabetes mellitus ([place]) (Chronic)    She is seeing endocrinology.  She is on a GLP-1 agonist, but did not tolerate SGLT2 inhibitors.  She is on high-dose atorvastatin with labs as of November last year well controlled.  She should be due for labs recheck soon, they can be checked as part of her precath evaluation labs (following next visit) if not already done.      Relevant Medications   losartan (COZAAR) 50 MG tablet   Dyspnea on exertion (Chronic)    Unfortunately, she has multifactorial exertional dyspnea now.  There is her underlying obesity and deconditioning with recent IPF, HFpEF exacerbated by worsening mitral stenosis.  Very difficult to assess her volume status.  Plan is to allow her to complete her prednisone taper, have follow-up GI and neurosurgical evaluations and then see her back in 6 weeks to discuss the possibility of right left heart catheterization.      Morbid obesity due to excess calories (HCC) (Chronic)    Certainly not any help with her dyspnea and symptomatology.   Unfortunately, now that her rheumatoid arthritis is acting back up again, and she is more dyspneic from IPF, she certainly is not exercising.  She has gained weight which is probably related to being on steroids.  Not that much from last year.  She states that she had lost some weight prior to this hospitalization.       ===================================  HPI:    Lynn [name] is a 65 y.o. female with a PMH notable FOR RHEUMATOID ARTHRITIS, Progression of disease), PAROXYSMAL ATRIAL TACHYCARDIA, PRESUMED CORONARY SPASM, HFpEF, HTN, HLD, DM-2 & and New Diagnosis of PAROXYSMAL ATRIAL FIBRILLATION (not on DOAC because of recent GI bleed-PUD and concern for cerebral AVM) who presents today for 6-week follow-up to discuss results of her event monitor (confirming A. fib), and recent echocardiographic evaluations.  Other PMH includes Thalassemia, CKD IIIa, former smoker & microcytic anemia.  Recent Hospitalizations:  [facility] 6/9-20/2022: Presented with fever cough and dyspnea.  This has been treated in the outpatient as CAP with doxycycline.  No improvement.  Continued to spike fevers.  Elevated D-dimer without PE on CT.  Significant elevated CRP and ESR.  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 TEE performed because of concern for large circumscribed mass adherent to the posterior mitral leaflet (seen on TTE) as well as moderate to severe MS -> followed by Cardiac MRI confirming that the "mass" was simply dense calcification of MAC MRI Brain --> ?? Dural AV fistula (now pending NSGx evaluation - has CT Angio Head ordered for 9/22) PCCM Consulted --> BAD c/w leukocytosis suggesting hypersensitivity pneumonitis (thought to be related to either methotrexate or feather pillow) Started on systemic steroids, MTX discontinued. Bactrim prophylaxis x 3 wks while on high-dose prednisone; Difficult Glycemic control on high dose  Steroids. HCTZ d/c due to concern for Hyponatremia on Bactrim.  Respiratory failure improved, appropriate resting oxygen saturations, but desaturation to 87% on ambulation requiring 2 L [place] O2. Melanotic Stool -- Hemoccult +, but stable H&H on PPI --> EGD => non-bleeding esophageal erosion/ulcer and 2 nonbleeding gastric ulcers. => d/c on BID Protonix. Afib RVR on Telemetry -- DOAC NOT STARTED 2/2 PUD/UGI Bld & Dural AV Fistula.  Home dose Diltiazem continued  Seen by Pulm Med APP 04/26/2021 -> appeared to be doing somewhat better.  Occasional dry cough with some mild mucus.  Noted cough and chest tightness.->  She denied ever having a feather pillow. -->  Recommended discussing alternative options of MTX for RA treatment with a rheumatologist.  Continuing slow taper of prednisone over 2 to 3 months.  Tapering every 14 days Remained on Bactrim DES, and twice daily Protonix. Still using 2 L oxygen with exertion and at night.  O2 sat did not drop below 94% on room air after 3 laps.  However she noted shortness of breath without oxygen and had to stop after 2 laps.  Denasia Venn was last seen on May 18, 2021 by Fabian Sharp, PA in hospital follow-up.  Her blood glucose dropped in the 40s upon arrival, and she was given candy and orange juice.  Glucose increased to 152. ->  She complained of spasms in the left chest that wraps around her left breast and radiates to the back-consistent with prior coronary spasm.  These seem to be more intense and lasted longer.  Was told to DC her Imdur on discharge.  Also noted similar fluttering and palpitations daily since discharge associated with dizziness and lightheadedness but no frank syncope.  Also noted profound exertional dyspnea. Told to restart Imdur and continued on losartan and Diltiazem. Event monitor ordered to evaluate A. fib burden, DOAC not started because of concerns of recent GI bleed and possible dural AVM.  This patients CHA2DS2-VASc Score  of 6, and unadjusted Ischemic Stroke Rate (% per year) is equal to 9.7 % stroke rate/year from a score of 6 Above score calculated as 1 point each if present [CHF, HTN, DM, Vascular=MI/PAD/Aortic Plaque, Age if 65-74, or Female]  Referred to Dr. Roderic Palau Thomas-neurosurgery for assessment of dural AVM  Reviewed  CV studies:    The following studies were reviewed today: (if available, images/films reviewed: From Epic Chart or Care Everywhere)  TTE 03/24/2021:: EF 60 to 65%.  LV function with normal wall motion.  Moderate basal septal LVH.  GRII DD & Mild LA dilation.-> elevated LVEDP.  Normal RV function.  Mildly elevated PAP.  Ill-defined density measuring 3.23 x 2.1 cm region of the posterior MV leaflet along with dense MAC.  Moderate MS (mean MVG of 9 mmHg.) Normal AoV & RAP. --> Recommend TEE 2/2 concern for MV SBE.  TEE 03/28/2021: LVEF estimated 55 to 60%.  Normal function.  No R WMA.  No LAA thrombus.  ~Normal AoV.  Gr II Aortic Plaque. Large circumscribed mass (1.8 x 1.4 cm) anterior to posterior annulus (DDx = myxoma, exuberant MAC, or other primary cardiac tumor).  Fixed posterior leaflet with Moderate to Severe MS (Mean MVG 14 mmHg, Peak MVG ~20 mmHg), and mild MR. -> Recommend Cardiac MRI. Mass seen on TTE in 2018  Cardiac MRI (03/30/2021): Normal LVEF ~53%. 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 No Scar or Late Gadolinium Enhancement on LV Myocardium. Unable to comment on Mitral Stenosis on MRI.   High Res CT Chest 03/30/2021: Marked increase in Upper/Mid-Lung zone predominantly peri-bronchovascular Ground-Glass, interstitial thickening and scattered collapse/consolidation compared to 03/21/2021. -->  Findings c/w atypical/viral pneumonia including COVID-19, acute drug toxicity with alternative diagnosis of IPF.  Aortic and coronary artery calcification.  Enlarged pulmonary trunk indicative of PAHtn.   F/u HR CT Chest 06/23/2021: Appearance  compatible w/ ILD, but spectrum of findings is most indicative of alternative Dx (not usual Interstitial Pneumonia) --> favored to reflect mild Post-infections or Inflammatory Fibrosis &  are significantly improved c/w 6/16. Aortic atherosclerosis in addition to left main and three-vessel coronary calcification. Severe bulky calcifications of the MV and MV annulus.   Event Monitor 8-06/2021 (ordered to assess AFib Burden - 21 days, 2 separate monitors) Overall predominantly sinus rhythm with minimum heart rate of 55 bpm, maximum 140 bpm and average of 81 bpm. Atrial fibrillation was seen with a 1% burden, longest duration was 1 hour 5 minutes. Heart rate ranged from 53 to 117 bpm. 30 episodes of brief supraventricular tachycardia/atrial tachycardia: Fastest heart rate 156 bpm 6 beats. Longest was 14 seconds with an average heart rate of 115 bpm. 2 Short Episodes of Nonsustained Ventricular Tachycardia longest and fastest run was average 140 bpm with a high of 167 bpm for ~ 9 beats   Interval History:   Lynn [name] presents here today for the first time I seen her in 11 months.  I did not see her during hospitalization because she was at [facility]  This is her second cardiology follow-up since the hospitalization.  She has multiple ongoing issues.  She says that her breathing is improving, and is getting close to being fully off of the steroids, and is worried about what happens at that point.  RA seems to be flaring up being off of MTX. Most notably is that she remains profoundly dyspneic at rest, but more so with walking.  She does not have her oxygen with her here today, but still uses it when she walks longer distances and at nighttime.  She notes PND and orthopnea, but cannot tell if this is just a sense of shortness of breath, or the fact she is her weight is up from being on steroids.  Dyspnea is very limiting.. She is still feeling these tight chest discomfort spells on the left  upper chest radiating to her shoulder.  This can happen with or without exertion, but does not necessarily associate with dyspnea. She also still feels palpitations sometimes not very long-lasting, but she did note some spells that lasted over 20 minutes.  Usually lasting less than a minute.  When they last longer she gets short of breath more and also feels somewhat dizzy and lightheaded.  No syncope or near syncope. No more episodes of melena or hematochezia. Her coughing seems to be better but still is there.  Not as productive. Unfortunately, with being off methotrexate, now her hands and ankles/knees are all starting to get stiff and painful.  CV Review of Symptoms (Summary) Cardiovascular ROS: positive for - chest pain, dyspnea on exertion, edema, irregular heartbeat, orthopnea, palpitations, paroxysmal nocturnal dyspnea, rapid heart rate, and shortness of breath negative for - true Pedal edema -swelling seems to be both in the hands and feet.  She is not sure if this is related to steroids or room to arthritis.  She is not really walking enough to notice claudication, but has not sensed any.  Does not necessarily note the chest pain is worse with exertion.  REVIEWED OF SYSTEMS   Review of Systems  Constitutional:  Positive for malaise/fatigue. Negative for weight loss (Has gained weight since being on steroids).  HENT:  Positive for congestion. Negative for nosebleeds.   Respiratory:  Positive for cough, shortness of breath and wheezing. Negative for sputum production (Scant).   Cardiovascular:        Per HPI  Gastrointestinal:  Positive for nausea. Negative for blood in stool and melena.  Genitourinary:  Negative for frequency and hematuria.  Neurological:  Positive for dizziness and weakness (Generalized). Negative for headaches.  Psychiatric/Behavioral:  Negative for depression and memory loss. The patient is nervous/anxious and has insomnia.    I have reviewed and (if needed)  personally updated the patient's problem list, medications, allergies, past medical and surgical history, social and family history.   PAST MEDICAL HISTORY   Past Medical History:  Diagnosis Date   Anxiety    Arthritis    "knees, wrists, back, elbows" (07/03/2017)   Clotting disorder ([place])    blood clot in eye 2016   Coronary artery disease, non-occlusive    a. 11/2016 NSTEMI/Cath: LM nl, LAD 40p, 65m, D1/2 small, LCX 40ost, OM2/3 nl/small, RCA 35 ost/mid, RPDA/RPL small/nl.   Coronary vasospasm (HCC)    Depression    Diastolic dysfunction    a. 11/2016 Echo: EF 55-60%, gr1 DD, sev calcified MV annulus, mildly dil LA.   Eye hemorrhage 01/2015   "right; resolved" (07/03/2017)   Headache    "monthly" (07/03/2017)   Heart murmur    History of blood transfusion    "when I had an ectopic pregnancy"   History of stomach ulcers    Hyperlipidemia    Hypertension    Microcytic anemia    Mitral stenosis    OSA on CPAP    setting is unknown   PAT (paroxysmal atrial tachycardia) (HCC)    Pneumonia    "several times" (07/03/2017)   Premature atrial contractions    PVC's (premature ventricular contractions)    Syncope 07/03/2017 X 2   called seizures but no medications   Thalassemia    "my cells are sickle cell shaped but I don't have sickle cell anemia" (07/03/2017)   Type II diabetes mellitus ([place]     PAST SURGICAL HISTORY   Past Surgical History:  Procedure Laterality Date   48-Hour Monitor  03/18/2017   Sinus rhythm with sinus tachycardia (rate 58-134 BPM)multiple PVCs noted with couplets and bigeminy. One triplet. 7 runs of PAT ranging from 100-130 bpm. Longest was 33 beats.   BIOPSY  04/03/2021   Procedure: BIOPSY;  Surgeon: AYetta Flock MD;  [facility];  Service: Gastroenterology;;   BREAST BIOPSY Left    BRONCHIAL WASHINGS  03/28/2021   Procedure: BRONCHIAL WASHINGS;  Surgeon: CJulian Hy DO;  [facility] [facility]  Service: Endoscopy;;   CARDIAC  CATHETERIZATION N/A 11/19/2016   Procedure: Left Heart Cath and Coronary Angiography;  Surgeon: HBelva Crome MD;  [facility];  Service: Cardiovascular.    LM nl, LAD 40p, 586m D1/2 small, LCX 40ost, OM2/3 nl/small, RCA 35 ost/mid, RPDA/RPL small/nl.   CARPAL TUNNEL RELEASE Right 11/01/2014   COLONOSCOPY     DILATION AND CURETTAGE OF UTERUS     ECTOPIC PREGNANCY SURGERY  X 2   ESOPHAGOGASTRODUODENOSCOPY (EGD) WITH PROPOFOL N/A 04/03/2021   Procedure: ESOPHAGOGASTRODUODENOSCOPY (EGD) WITH PROPOFOL;  Surgeon: ArYetta FlockMD;  [facility];  Service: Gastroenterology;  Laterality: N/A;   JOINT REPLACEMENT     KNEE ARTHROSCOPY Left 11/2014   TEE WITHOUT CARDIOVERSION N/A 03/28/2021   Procedure: TRANSESOPHAGEAL ECHOCARDIOGRAM (TEE);  Surgeon: NaAcie FredricksonhWonda ChengMD;  [facility];  Service: Cardiovascular;  Laterality: N/A;   TONSILLECTOMY     TOTAL KNEE ARTHROPLASTY Right 02/18/2015   Procedure: RIGHT TOTAL KNEE ARTHROPLASTY;  Surgeon: StNetta CedarsMD;  [facility] Service: [facility]ics;  Laterality: Right;   TOTAL KNEE ARTHROPLASTY Left 08/19/2015   Procedure: LEFT TOTAL KNEE ARTHROPLASTY;  Surgeon: Netta Cedars, MD;  Location: [place];  Service: Orthopedics;  Laterality: Left;   TRANSTHORACIC ECHOCARDIOGRAM  11/2016; 07/2017:   a) normal LV size, thickness and function. EF 55-60%. GR 1 DD.No RWMA severe mitral annular calcification, but no mitral stenosis. Mild LA dilation;; b) normal LV size and function.  EF 65-75%.  Unable to assess diastolic function.  Aortic sclerosis with no stenosis.  Moderate mitral stenosis? (Gradient 9 mmHg) -?  Mild-mod left atrial enlargement    TRANSTHORACIC ECHOCARDIOGRAM  01/2019   EF 65 to 70%.  Normal function.  Elevated LVEDP-GR 1 DD.  Normal RV size and function.  Large calcific mass on posterior mitral leaflet mild to moderate mitral stenosis.   VAGINAL HYSTERECTOMY     VIDEO BRONCHOSCOPY N/A 03/28/2021   Procedure:  VIDEO BRONCHOSCOPY WITHOUT FLUORO;  Surgeon: Julian Hy, DO;  Location[facility] ENDOSCOPY;  Service: Endoscopy;  Laterality: N/A;    Immunization History  Administered Date(s) Administered   Influenza Split 07/22/2012   Influenza, High Dose Seasonal PF 06/10/2019   Influenza,inj,Quad PF,6+ Mos 10/09/2014, 07/13/2015, 11/17/2016, 07/04/2017, 07/10/2018   Influenza-Unspecified 06/20/2020   Moderna Sars-Covid-2 Vaccination 11/27/2019, 12/26/2019, 06/22/2020, 12/30/2020, 06/21/2021   Pneumococcal Conjugate-13 03/23/2019   Pneumococcal Polysaccharide-23 08/22/2020   Pneumococcal-Unspecified 02/12/2010   Tdap 02/12/2010, 10/17/2016   Zoster Recombinat (Shingrix) 02/20/2021    MEDICATIONS/ALLERGIES   Current Meds  Medication Sig   acetaminophen (TYLENOL) 500 MG tablet Take 500 mg by mouth every 6 (six) hours as needed for fever.   albuterol (VENTOLIN HFA) 108 (90 Base) MCG/ACT inhaler Inhale 2 puffs into the lungs every 6 (six) hours as needed for wheezing or shortness of breath.   atorvastatin (LIPITOR) 80 MG tablet TAKE 1 TABLET(80 MG) BY MOUTH DAILY AT 6 PM   cetirizine (ZYRTEC) 10 MG tablet Take 10 mg by mouth daily as needed for allergies.   diltiazem (TIAZAC) 240 MG 24 hr capsule Take by mouth.   folic acid (FOLVITE) 1 MG tablet Take 2 tablets (2 mg total) by mouth daily.   Insulin Disposable Pump (OMNIPOD DASH PODS, GEN 4,) MISC Inject 1 Dose into the skin continuous. Using pump - depends on carb intake.   insulin lispro (HUMALOG) 100 UNIT/ML injection Max Daily 100 units per pump   Insulin Pen Needle (B-D UF III MINI PEN NEEDLES) 31G X 5 MM MISC USE DAILY WITH VICTOZA AND LANTUS.   isosorbide mononitrate (IMDUR) 30 MG 24 hr tablet Take 30 mg  tablet as needed for Chest pain spasm episodes for 3 days and the stop (Patient taking differently: Take 30 mg  tablet as needed for Chest pain spasm episodes for 3 days and the stop)   losartan (COZAAR) 100 MG tablet Take by mouth.   Multiple  Vitamin (MULTIVITAMIN WITH MINERALS) TABS tablet Take 1 tablet by mouth daily.   predniSONE (DELTASONE) 10 MG tablet Take 3 tablets (30 mg total) by mouth daily with breakfast for 14 days, THEN 2 tablets (20 mg total) daily with breakfast for 14 days, THEN 1 tablet (10 mg total) daily with breakfast for 14 days, THEN 0.5 tablets (5 mg total) daily with breakfast for 14 days, THEN 0.5 tablets (5 mg total) every Monday, Wednesday, and Friday for 14 days.   traZODone (DESYREL) 50 MG tablet TAKE 0.5-1 TABLETS BY MOUTH AT BEDTIME AS NEEDED FOR SLEEP.   VICTOZA 18 MG/3ML SOPN INJECT 1.8 MG UNDER THE SKIN ONCE DAILY   [name] tried in June 2020 -> Developed Dizziness.  Allergies  Allergen Reactions   Penicillins Hives    Childhood allergy Has patient had a PCN reaction causing immediate rash, facial/tongue/throat swelling, SOB or lightheadedness with hypotension: Yes Has patient had a PCN reaction causing severe rash involving mucus membranes or skin necrosis: No Has patient had a PCN reaction that required hospitalization No Has patient had a PCN reaction occurring within the last 10 years: No If all of the above answers are "NO", then may proceed with Cephalosporin use.   Metformin And Related Other (See Comments)    Diarrhea, bleeding   Farxiga [Dapagliflozin] Other (See Comments)    Dizzy and lethargic     SOCIAL HISTORY/FAMILY HISTORY   Reviewed in Epic:  Pertinent findings:  Social History   Tobacco Use   Smoking status: Former    Packs/day: 0.25    Years: 44.00    Pack years: 11.00    Types: Cigarettes    Quit date: 02/17/2015    Years since quitting: 6.3   Smokeless tobacco: Never  Vaping Use   Vaping Use: Never used  Substance Use Topics   Alcohol use: Not Currently    Comment: 07/03/2017 "might have a few drinks/year"   Drug use: No   Social History   Social History Narrative   Not on file    OBJCTIVE -PE, EKG, labs   Wt Readings from Last 3 Encounters:  07/05/21  252 lb 6.4 oz (114.5 kg)  06/28/21 251 lb 9.6 oz (114.1 kg)  06/09/21 247 lb (112 kg)    Physical Exam: BP 115/60   Pulse 87   Ht _0  (1.626 m)   Wt 252 lb 6.4 oz (114.5 kg)   LMP  (LMP Unknown)   SpO2 95%   BMI 43.32 kg/m  Physical Exam Constitutional:      General: She is not in acute distress (She just seems uncomfortable, a bit out of sorts.  Mild distress).    Appearance: She is not ill-appearing or toxic-appearing.     Comments: Seems to be more bloated-likely related to steroids.  Definitely heavier.  HENT:     Head: Normocephalic and atraumatic.  Neck:     Vascular: No carotid bruit, hepatojugular reflux or JVD (Very difficult to assess due to body habitus).  Cardiovascular:     Rate and Rhythm: Normal rate and regular rhythm. Occasional Extrasystoles are present.    Chest Wall: PMI is not displaced (Unable to assess).     Pulses: Decreased pulses (Diminished pulses due to pedal and ankle swelling, palpable).     Heart sounds: S1 normal and S2 normal. Heart sounds are distant. No murmur (Soft 1/6 SEM at RUSB heard.  Unable to auscultate diastolic murmur.) heard.   No friction rub. No gallop. No S4 (With distant sound, difficult to assess for S4.) sounds.  Pulmonary:     Effort: Respiratory distress (Mildly increased WOB, mild respiratory distress) present.     Breath sounds: No wheezing, rhonchi or rales.     Comments: Distant BS t/o.  Faint mid-lung crackles.  Chest:     Chest wall: Tenderness (Bilateral costosternal tenderness) present.  Abdominal:     General: Bowel sounds are normal. There is no distension.     Palpations: Abdomen is soft.     Tenderness: There is no abdominal tenderness.     Comments: Obese, unable to assess HSM or HJR.  Musculoskeletal:        General: Swelling (puffy swelling of both hands & feet (as well  as face)) present.     Cervical back: Normal range of motion and neck supple.  Skin:    General: Skin is warm and dry.  Neurological:      General: No focal deficit present.     Mental Status: She is alert and oriented to person, place, and time.     Gait: Gait abnormal.  Psychiatric:        Mood and Affect: Mood normal.        Behavior: Behavior normal.        Thought Content: Thought content normal.        Judgment: Judgment normal.     Adult ECG Report  Rate: 87 ;  Rhythm: sinus arrhythmia and low voltage QRS complexes.  Cannot rule out anterior MI, age-indeterminate. ;   Narrative Interpretation: Stable compared to last EKG.  PACs not noted this time.  Recent Labs: Reviewed Lab Results  Component Value Date   CHOL 97 08/22/2020   HDL 38.80 (L) 08/22/2020   LDLCALC 44 08/22/2020   TRIG 74.0 08/22/2020   CHOLHDL 3 08/22/2020   Lab Results  Component Value Date   CREATININE 1.15 (H) 05/18/2021   BUN 16 05/18/2021   NA 141 05/18/2021   K 4.0 05/18/2021   CL 100 05/18/2021   CO2 24 05/18/2021   CBC Latest Ref Rng & Units 05/18/2021 04/05/2021 04/03/2021  WBC 3.4 - 10.8 x10E3/uL 15.8(H) 17.8(H) 21.1(H)  Hemoglobin 11.1 - 15.9 g/dL 9.3(L) 9.5(L) 9.1(L)  Hematocrit 34.0 - 46.6 % 30.6(L) 31.6(L) 30.8(L)  Platelets 150 - 450 x10E3/uL 306 336.0 336    Lab Results  Component Value Date   HGBA1C 8.3 (A) 06/09/2021   Lab Results  Component Value Date   TSH 4.01 03/20/2021    ==================================================  COVID-19 Education: The signs and symptoms of COVID-19 were discussed with the patient and how to seek care for testing (follow up with PCP or arrange E-visit).    I spent a total of 56 minutes with the patient spent in direct patient consultation. --> We reviewed her multiple complaints both pre and post hospitalization.  I explained to her the findings on TTE and TEE as well as cardiac MRI as well as event monitor.  We spent quite a bit of time discussing potential upcoming plans and how they are pending decisions made by both GI and neurosurgery. Additional time spent with chart  review  / charting (studies, outside notes, etc): 49 min -> In the year since have seen her, she had a prolonged hospitalization with multiple studies done.  Several clinic visits or pertinent to ongoing care as well her upcoming studies and visits. Total Time: 105 min  Current medicines are reviewed at length with the patient today.  (+/- concerns) worried about what to do without MTX for RA!Marland Kitchen  This visit occurred during the SARS-CoV-2 public health emergency.  Safety protocols were in place, including screening questions prior to the visit, additional usage of staff PPE, and extensive cleaning of exam room while observing appropriate contact time as indicated for disinfecting solutions.  Notice: This dictation was prepared with Dragon dictation along with smaller phrase technology. Any transcriptional errors that result from this process are unintentional and may not be corrected upon review.  Patient Instructions / Medication Changes & Studies & Tests Ordered   Patient Instructions  Medication Instructions:   Increase diltiazem to 360 mg daily   Decrease losartan 50 mg daily   *If you need a refill on your  cardiac medications before your next appointment, please call your pharmacy*   Lab Work:  notneeded   Testing/Procedures:  Not needed  Follow-Up: At [facility], you and your health needs are our priority.  As part of our continuing mission to provide you with exceptional heart care, we have created designated Provider Care Teams.  These Care Teams include your primary Cardiologist (physician) and Advanced Practice Providers (APPs -  Physician Assistants and Nurse Practitioners) who all work together to provide you with the care you need, when you need it.     Your next appointment:    Jul 31, 2021  The format for your next appointment:   In Person  Provider:   Glenetta Hew, MD   Other Instructions    Studies Ordered:   Orders Placed This Encounter   Procedures   EKG 12-Lead     Glenetta Hew, M.D., M.S. Interventional Cardiologist   Pager # [phone] Phone # [phone] [address] [place], [place] [address]   Thank you for choosing [facility] at [facility]!!

## 2021-07-06 ENCOUNTER — Ambulatory Visit
Admission: RE | Admit: 2021-07-06 | Discharge: 2021-07-06 | Disposition: A | Discharge status: Home / Self Care | Payer: Medicare Other | Source: Ambulatory Visit | Attending: Neurosurgery | Admitting: Neurosurgery

## 2021-07-06 ENCOUNTER — Encounter: Payer: Self-pay | Admitting: Cardiology

## 2021-07-06 DIAGNOSIS — Q283 Other malformations of cerebral vessels: Secondary | ICD-10-CM

## 2021-07-06 DIAGNOSIS — I6523 Occlusion and stenosis of bilateral carotid arteries: Secondary | ICD-10-CM | POA: Diagnosis not present

## 2021-07-06 DIAGNOSIS — G9389 Other specified disorders of brain: Secondary | ICD-10-CM | POA: Diagnosis not present

## 2021-07-06 MED ORDER — IOPAMIDOL (ISOVUE-370) INJECTION 76%
75.0000 mL | Freq: Once | INTRAVENOUS | Status: AC | PRN
  Administered 2021-07-06: 75 mL via INTRAVENOUS

## 2021-07-06 NOTE — Assessment & Plan Note (Addendum)
Previous ischemic evaluations have been negative.  She does have significant artery calcification seen on CT scans.  I consider the possibility of coronary CTA versus cardiac catheterization, however with the need to assess severity of mitral valve disease, invasive evaluation with right left heart catheterization is likely a better option.  I would like for her to finish out her prednisone taper and hopefully her pulmonary issues will clear up so we can then assess cardiac issues without the added affect of her pulmonary disease.  Currently on high-dose atorvastatin with well-controlled lipids. She is on diltiazem for presumed spasm and tachycardia spells.  We are titrating this up to 360 mg daily and continuing Imdur.  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

## 2021-07-06 NOTE — Assessment & Plan Note (Signed)
Certainly not any help with her dyspnea and symptomatology.  Unfortunately, now that her rheumatoid arthritis is acting back up again, and she is more dyspneic from IPF, she certainly is not exercising.  She has gained weight which is probably related to being on steroids.  Not that much from last year.  She states that she had lost some weight prior to this hospitalization.

## 2021-07-06 NOTE — Assessment & Plan Note (Signed)
She is seeing endocrinology.  She is on a GLP-1 agonist, but did not tolerate SGLT2 inhibitors.  She is on high-dose atorvastatin with labs as of November last year well controlled.  She should be due for labs recheck soon, they can be checked as part of her precath evaluation labs (following next visit) if not already done.

## 2021-07-06 NOTE — Assessment & Plan Note (Signed)
History of coronary spasm, presenting.  With intermittent chest pain episodes now that happening with increased frequency and intensity.  I do think some of the chest pain may be costosternal/costochondritis pain that could really be related to her rheumatoid arthritis.  However, with ongoing chest pain symptoms, persistent dyspnea with now diagnosis of severe mitral stenosis, unlikely considering Right and Left Heart Catheterization at 6-week follow-up.

## 2021-07-06 NOTE — Assessment & Plan Note (Signed)
A. fib confirmed on monitor, at least 1% burden.  Not a lot, but still present.  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  She is due to actually have a CT angiogram of the head on 07/06/2021 followed by neurosurgery consult on 07/07/2021.  I am hoping that we get an answer as to whether or not we can safely start [place].  Rates did not seem to be going that fast, but she is having PAT spells that are up faster.  Plan: For now continue rate control with diltiazem but increase to 360 mg daily.

## 2021-07-06 NOTE — Assessment & Plan Note (Signed)
.  Blood pressures well controlled today.  She does not need to be back on the HCTZ in fact with [place] increasing the diltiazem to 360 mg, on the reduced losartan to 50 mg.  Continue Imdur.

## 2021-07-06 NOTE — Assessment & Plan Note (Signed)
Unfortunately, she has multifactorial exertional dyspnea now.  There is her underlying obesity and deconditioning with recent IPF, HFpEF exacerbated by worsening mitral stenosis.  Very difficult to assess her volume status.  Plan is to allow her to complete her prednisone taper, have follow-up GI and neurosurgical evaluations and then see her back in 6 weeks to discuss the possibility of right left heart catheterization.

## 2021-07-06 NOTE — Assessment & Plan Note (Signed)
Previously diagnosed with mild to moderate mitral stenosis-calcific.  Now on recent echo gradient was increased to 9 mmHg consider be moderate and then the TEE suggested moderate to severe with a mean gradient of 14 mmHg.  I am concerned with having mitral stenosis and PAF that she could potentially decompensate where she is to go into prolonged A. fib.  She does already have evidence of HFpEF which would only be exacerbated.  Unfortunately, with her ongoing pulmonary issues/IPF, she would not be a good candidate for surgical referral.  Also, with MAC related MS, MV replacement would not necessarily be favorable.  Plan:  Need to discuss results of TTE, TEE and CMR with noninvasive colleagues to confirm that indeed there is significant MS that would potentially warrant surgical valve replacement.  With her having chest pain now and worsening dyspnea, would also consider the possibility of Right and Left Heart Catheterization as part of the evaluation of mitral valve severity as well as any potential CAD given that she has significant coronary calcification on CT scan.  Prior to considering cardiac catheterization, need to know that she can be started on DOAC plus or minus Plavix  I plan to see her back in about 6 weeks at which time we should have an answer from both GI and neurosurgery.  Can also reassess her dyspnea symptoms.    At that time, would likely plan to schedule right left heart catheterization as part of a diagnostic evaluation prior to referral to outside surgical center where a dedicated mitral valve surgeon is available.Marland Kitchen

## 2021-07-06 NOTE — Assessment & Plan Note (Signed)
Relatively frequent PAT spells noted on monitor.  Short-lived, but still present and somewhat concerning symptom wise especially as they may be triggers for A. Fib.  Plan: Increase diltiazem to 360 mg daily, reduce losartan to 50 mg to allow for change in blood pressure.

## 2021-07-06 NOTE — Assessment & Plan Note (Signed)
Difficult to tell her volume status.  She seems bloated which is probably more from prednisone.  She is not currently on a diuretic.  She does have orthopnea and PND, but it is difficult to tell especially the lung findings if this is related to her IPF or true CHF.  As part of her mitral valve evaluation, anticipate right and left heart catheterization.

## 2021-07-06 NOTE — Assessment & Plan Note (Signed)
She is not very active, and some exertional dyspnea may simply be related to deconditioning, obesity but also RA related joint pains.

## 2021-07-07 ENCOUNTER — Ambulatory Visit: Payer: Medicare Other | Admitting: Gastroenterology

## 2021-07-07 ENCOUNTER — Encounter: Payer: Self-pay | Admitting: Gastroenterology

## 2021-07-07 ENCOUNTER — Telehealth: Payer: Self-pay

## 2021-07-07 VITALS — BP 126/78 | HR 77 | Ht 64.0 in | Wt 255.0 lb

## 2021-07-07 DIAGNOSIS — K253 Acute gastric ulcer without hemorrhage or perforation: Secondary | ICD-10-CM

## 2021-07-07 DIAGNOSIS — K2210 Ulcer of esophagus without bleeding: Secondary | ICD-10-CM

## 2021-07-07 DIAGNOSIS — K2100 Gastro-esophageal reflux disease with esophagitis, without bleeding: Secondary | ICD-10-CM

## 2021-07-07 MED ORDER — PANTOPRAZOLE SODIUM 40 MG PO TBEC: 40.0000 mg | DELAYED_RELEASE_TABLET | Freq: Two times a day (BID) | ORAL | 11 refills | Status: DC

## 2021-07-07 NOTE — Telephone Encounter (Signed)
Informed patient of Dr. Kelton Pillar recommendations.

## 2021-07-07 NOTE — Telephone Encounter (Signed)
Lynn [name] September 21, 1956 [id number]   Dear Dr. Kelton Pillar    Dr. Fuller Plan has scheduled the above patient for a(n) EGD at 9:30am on 08/15/21.  Our records show that he/she is on insulin therapy via an insulin pump.  Our EGD prep protocol requires that:  the patient must be on a clear liquids up to 4 hours prior to procedure and no solid foods after midnight the night before the procedure.  Please advise [place] of any adjustments that need to be made to the patient's insulin pump therapy prior to the above procedure date.    Please route back to [employer], [place].  If you have any questions, please call me at [phone].  Thank you for your help with this matter.  Sincerely,  Dorisann Frames, CMA

## 2021-07-07 NOTE — Progress Notes (Signed)
    History of Present Illness: This is a 65 year old female returning for follow-up of gastric ulcers and esophageal ulcer.  During hospitalization for respiratory failure heme + stool, anemia was noted and she underwent EGD as below.  She was placed on pantoprazole 40 mg twice daily.  She has atrial fibrillation with mitral stenosis.  Anticoagulation has been recommended and clearance from a GI standpoint to begin anticoagulation as requested.  She has been treated with prednisone for ILD and respiratory failure.  She has recently been on a prednisone taper.  She is maintained on continuous O2 by nasal cannula.  No active gastrointestinal complaints.  Denies weight loss, abdominal pain, constipation, diarrhea, change in stool caliber, melena, hematochezia, nausea, vomiting, dysphagia, reflux symptoms, chest pain.   EGD 03/2021 - Esophagogastric landmarks identified. - 1 cm hiatal hernia. - Esophageal erosion / ulcer with no bleeding and no stigmata of recent bleeding. - 2 Non-bleeding small gastric ulcers with no stigmata of bleeding. Biopsied. - Normal stomach otherwise - no heme noted anywhere. Biopsies taken to rule out H pylori - Normal duodenal bulb and second portion of the duodenum.  Current Medications, Allergies, Past Medical History, Past Surgical History, Family History and Social History were reviewed in [employer] record.   Physical Exam: General: Well developed, well nourished, no acute distress, O2 by [place] Head: Normocephalic and atraumatic Eyes: Sclerae anicteric, EOMI Ears: Normal auditory acuity Mouth: Not examined, mask on during Covid-19 pandemic Lungs: Clear throughout to auscultation Heart: Regular rate and rhythm; no murmurs, rubs or bruits Abdomen: Soft, non tender and non distended. No masses, hepatosplenomegaly or hernias noted. Normal Bowel sounds Rectal: Not done Musculoskeletal: Symmetrical with no gross deformities  Pulses:  Normal  pulses noted Extremities: No clubbing, cyanosis, edema or deformities noted Neurological: Alert oriented x 4, grossly nonfocal Psychological:  Alert and cooperative. Normal mood and affect   Assessment and Recommendations:  Gastric ulcers, esophageal ulcer, GERD. Continue pantoprazole 40 mg po bid. Follow antireflux measures long-term.  Avoid aspirin and NSAIDs.  Assess for ulcer healing prior to starting anticoagulation. Schedule EGD at the hospital. The risks (including bleeding, perforation, infection, missed lesions, medication reactions and possible hospitalization or surgery if complications occur), benefits, and alternatives to endoscopy with possible biopsy and possible dilation were discussed with the patient and they consent to proceed.  If gastric and esophageal ulcers have completely healed we will plan to maintain her on pantoprazole 40 mg daily indefinitely to reduce the risk of ulcer recurrence. Microcytic anemia.  History of thalassemia. ILD with chronic respiratory failure on O2. Prednisone is being tapered. F/U with Pulmonary as planned.  Afib, mitral valve stenosis. GI clearance to start anticoagulation has been requested.  Follow-up with cardiology as planned. DM on insulin pump.

## 2021-07-07 NOTE — Patient Instructions (Signed)
We have sent the following medications to your pharmacy for you to pick up at your convenience: pantoprazole 40 mg twice daily.   Patient advised to avoid spicy, acidic, citrus, chocolate, mints, fruit and fruit juices.  Limit the intake of caffeine, alcohol and Soda.  Don't exercise too soon after eating.  Don't lie down within 3-4 hours of eating.  Elevate the head of your bed.  You have been scheduled for an endoscopy. Please follow written instructions given to you at your visit today. If you use inhalers (even only as needed), please bring them with you on the day of your procedure.  The [facility] providers would like to encourage you to use [facility] to communicate with providers for non-urgent requests or questions.  Due to long hold times on the telephone, sending your provider a message by [facility] may be a faster and more efficient way to get a response.  Please allow 48 business hours for a response.  Please remember that this is for non-urgent requests.   Due to recent changes in healthcare laws, you may see the results of your imaging and laboratory studies on MyChart before your provider has had a chance to review them.  We understand that in some cases there may be results that are confusing or concerning to you. Not all laboratory results come back in the same time frame and the provider may be waiting for multiple results in order to interpret others.  Please give [place] 48 hours in order for your provider to thoroughly review all the results before contacting the office for clarification of your results.   Thank you for choosing me and [facility].  Pricilla Riffle. Dagoberto Ligas., MD., Marval Regal

## 2021-07-10 DIAGNOSIS — Q283 Other malformations of cerebral vessels: Secondary | ICD-10-CM | POA: Diagnosis not present

## 2021-07-10 DIAGNOSIS — I639 Cerebral infarction, unspecified: Secondary | ICD-10-CM | POA: Diagnosis not present

## 2021-07-16 NOTE — Progress Notes (Signed)
[place] at [facility] [address] [place], [place] [address] [id number]  Date:  07/19/2021   Name:  Lynn [name]   DOB:  1956/03/31   MRN:  [id number]  PCP:  [name], MD    Chief Complaint: Follow-up (Over all Lynn [name], flu shot, handicap parking paper, update the meds/Concerns/ questions: back pain/Flu shot today:yes//)   History of Present Illness:  Lynn [name] is a 65 y.o. very pleasant female patient who presents with the following:  Patient seen today for periodic visit Most recently seen by myself in [place] that time she had recently been admitted to the hospital for 10 days with respiratory distress  Seen by her cardiologist, Glenetta Hew on September 21st She has paroxysmal atrial tachycardia and atrial fib, treated with diltiazem.   She is not currently on anticoagulation due to concern of possible ulcer bleed and brain AVM.  Anticoag is ok per NSG, but she is still waiting on an upper GI 11/1 to see if this is a go Also history of coronary spasm causing chest pain, recent negative heart cath. Chronic diastolic CHF and mitral stenosis are being monitored  Visit with pulmonology, Dr. Chase Caller on September 14 Interstitial lung disease.  Using oxygen at night and during the day as well now   Visit with endocrinology, Dr. Kelton Pillar August 26 She is using an insulin pump-Humalog, as well as Victoza  Can give second dose Zostavax COVID bivalent up-to-date Flu shot today   She notes that her left nostril has been bleeding off and on since June The right side is ok  She is now on oxygen 24/7 She uses 4L at night, 3L daytime   She has seen DR Ellyn Hack- he is talking about doing a mitral valve replacement- however they are not sure if this will be safe for her  She notes a lot of lower back pain- this limits her ability to stand and do things such as shower  She is using tylenol plain  only for pain control She is no longer on any methotrexate or steroids for severe arthritis due to other health conditions Her rheumatologist is Leafy Kindle   She notes that she is expecting a great- grandchild coming up -she is quite happy about this  Flu shot today Patient Active Problem List   Diagnosis Date Noted   PAF (paroxysmal atrial fibrillation) ([place]) 07/05/2021   Anemia    Acute gastric ulcer with hemorrhage    Occult GI bleeding    Melena    Preop respiratory exam    ILD (interstitial lung disease) ([place])    SOB (shortness of breath) 03/24/2021   FUO (fever of unknown origin) 03/24/2021   Hypokalemia 03/24/2021   Generalized weakness 03/24/2021   Coronary artery spasm ([place]) 08/09/2020   Rheumatoid arthritis ([place]) 11/09/2019   Toe pain, bilateral 05/26/2019   Type 2 diabetes mellitus with hyperglycemia, with long-term current use of insulin ([place]) 05/26/2019   Mobility impaired 04/01/2019   Morbid obesity due to excess calories ([place]) 12/03/2017   Dyspnea on exertion 12/02/2017   Moderate mitral stenosis by prior echocardiography 08/23/2017   Chronic diastolic CHF (congestive heart failure) ([place]) 08/12/2017   Chronic respiratory failure with hypoxia ([place]) 08/12/2017   OSA on CPAP 08/12/2017   COPD with acute bronchitis ([place]) 08/12/2017   Near syncope 07/03/2017   Anxiety 07/03/2017   Coronary artery disease, non-occlusive 12/08/2016   PAT (paroxysmal atrial tachycardia) (  [place])    Demand myocardial infarction ([place]) 11/16/2016   H/O total knee replacement 08/19/2015   Vision, loss, sudden 03/07/2015   S/P TKR (total knee replacement) using cement 02/18/2015   Thalassemia 09/02/2013   Hyperlipidemia associated with type 2 diabetes mellitus ([place]) 08/24/2012   Essential hypertension 07/22/2012    Past Medical History:  Diagnosis Date   Anxiety    Arthritis    "knees, wrists, back, elbows" (07/03/2017)   Clotting disorder ([place])    blood clot in eye 2016    Coronary artery disease, non-occlusive    a. 11/2016 NSTEMI/Cath: LM nl, LAD 40p, 7md, D1/2 small, LCX 40ost, OM2/3 nl/small, RCA 35 ost/mid, RPDA/RPL small/nl.   Coronary vasospasm ([facility])    Depression    Diastolic dysfunction    a. 11/2016 Echo: EF 55-60%, gr1 DD, sev calcified MV annulus, mildly dil LA.   Eye hemorrhage 01/2015   "right; resolved" (07/03/2017)   Headache    "monthly" (07/03/2017)   Heart murmur    History of blood transfusion    "when I had an ectopic pregnancy"   History of stomach ulcers    Hyperlipidemia    Hypertension    Microcytic anemia    Mitral stenosis    OSA on CPAP    setting is unknown   PAT (paroxysmal atrial tachycardia) ([facility])    Pneumonia    "several times" (07/03/2017)   Premature atrial contractions    PVC's (premature ventricular contractions)    Syncope 07/03/2017 X 2   called seizures but no medications   Thalassemia    "my cells are sickle cell shaped but I don't have sickle cell anemia" (07/03/2017)   Type II diabetes mellitus ([place])     Past Surgical History:  Procedure Laterality Date   48-Hour Monitor  03/18/2017   Sinus rhythm with sinus tachycardia (rate 58-134 BPM)multiple PVCs noted with couplets and bigeminy. One triplet. 7 runs of PAT ranging from 100-130 bpm. Longest was 33 beats.   BIOPSY  04/03/2021   Procedure: BIOPSY;  Surgeon: Yetta Flock, MD;  Location: [facility];  Service: Gastroenterology;;   BREAST BIOPSY Left    BRONCHIAL WASHINGS  03/28/2021   Procedure: BRONCHIAL WASHINGS;  Surgeon: Julian Hy, DO;  Location: [facility];  Service: Endoscopy;;   CARDIAC CATHETERIZATION N/A 11/19/2016   Procedure: Left Heart Cath and Coronary Angiography;  Surgeon: Belva Crome, MD;  Location: [facility];  Service: Cardiovascular.    LM nl, LAD 40p, 28md, D1/2 small, LCX 40ost, OM2/3 nl/small, RCA 35 ost/mid, RPDA/RPL small/nl.   CARPAL TUNNEL RELEASE Right 11/01/2014   COLONOSCOPY     DILATION AND CURETTAGE OF  UTERUS     ECTOPIC PREGNANCY SURGERY  X 2   ESOPHAGOGASTRODUODENOSCOPY (EGD) WITH PROPOFOL N/A 04/03/2021   Procedure: ESOPHAGOGASTRODUODENOSCOPY (EGD) WITH PROPOFOL;  Surgeon: Yetta Flock, MD;  Location: [facility];  Service: Gastroenterology;  Laterality: N/A;   JOINT REPLACEMENT     KNEE ARTHROSCOPY Left 11/2014   TEE WITHOUT CARDIOVERSION N/A 03/28/2021   Procedure: TRANSESOPHAGEAL ECHOCARDIOGRAM (TEE);  Surgeon: Acie Fredrickson Wonda Cheng, MD;  Location: [facility];  Service: Cardiovascular;  Laterality: N/A;   TONSILLECTOMY     TOTAL KNEE ARTHROPLASTY Right 02/18/2015   Procedure: RIGHT TOTAL KNEE ARTHROPLASTY;  Surgeon: Netta Cedars, MD;  Location: [facility];  Service: Orthopedics;  Laterality: Right;   TOTAL KNEE ARTHROPLASTY Left 08/19/2015   Procedure: LEFT TOTAL KNEE ARTHROPLASTY;  Surgeon: Netta Cedars, MD;  Location: [facility];  Service: Orthopedics;  Laterality: Left;   TRANSTHORACIC ECHOCARDIOGRAM  11/2016; 07/2017:   a) normal LV size, thickness and function. EF 55-60%. GR 1 DD.No RWMA severe mitral annular calcification, but no mitral stenosis. Mild LA dilation;; b) normal LV size and function.  EF 65-75%.  Unable to assess diastolic function.  Aortic sclerosis with no stenosis.  Moderate mitral stenosis? (Gradient 9 mmHg) -?  Mild-mod left atrial enlargement    TRANSTHORACIC ECHOCARDIOGRAM  01/2019   EF 65 to 70%.  Normal function.  Elevated LVEDP-GR 1 DD.  Normal RV size and function.  Large calcific mass on posterior mitral leaflet mild to moderate mitral stenosis.   VAGINAL HYSTERECTOMY     VIDEO BRONCHOSCOPY N/A 03/28/2021   Procedure: VIDEO BRONCHOSCOPY WITHOUT FLUORO;  Surgeon: Julian Hy, DO;  Location[facility];  Service: Endoscopy;  Laterality: N/A;    Social History   Tobacco Use   Smoking status: Former    Packs/day: 0.25    Years: 44.00    Pack years: 11.00    Types: Cigarettes    Quit date: 02/17/2015    Years since quitting: 6.4   Smokeless tobacco: Never   Vaping Use   Vaping Use: Never used  Substance Use Topics   Alcohol use: Not Currently    Comment: 07/03/2017 "might have a few drinks/year"   Drug use: No    Family History  Problem Relation Age of Onset   Cancer Mother    Diabetes Mother    Hypertension Mother    Hyperlipidemia Mother    Cancer Father    Hyperlipidemia Father    Mental illness Sister    Diabetes Sister    Diabetes Maternal Grandmother    Diabetes Maternal Grandfather    Colon cancer Neg Hx    Esophageal cancer Neg Hx    Stomach cancer Neg Hx    Rectal cancer Neg Hx     Allergies  Allergen Reactions   Penicillins Hives    Childhood allergy Has patient had a PCN reaction causing immediate rash, facial/tongue/throat swelling, SOB or lightheadedness with hypotension: Yes Has patient had a PCN reaction causing severe rash involving mucus membranes or skin necrosis: No Has patient had a PCN reaction that required hospitalization No Has patient had a PCN reaction occurring within the last 10 years: No If all of the above answers are "NO", then may proceed with Cephalosporin use.   Metformin And Related Other (See Comments)    Diarrhea, bleeding   Farxiga [Dapagliflozin] Other (See Comments)    Dizzy and lethargic     Medication list has been reviewed and updated.  Current Outpatient Medications on File Prior to Visit  Medication Sig Dispense Refill   acetaminophen (TYLENOL) 500 MG tablet Take 500 mg by mouth every 6 (six) hours as needed for fever.     albuterol (VENTOLIN HFA) 108 (90 Base) MCG/ACT inhaler Inhale 2 puffs into the lungs every 6 (six) hours as needed for wheezing or shortness of breath. 8 g 2   atorvastatin (LIPITOR) 80 MG tablet TAKE 1 TABLET(80 MG) BY MOUTH DAILY AT 6 PM 90 tablet 3   cetirizine (ZYRTEC) 10 MG tablet Take 10 mg by mouth daily as needed for allergies.     Continuous Blood Gluc Sensor (DEXCOM G6 SENSOR) MISC 1 Device by Does not apply route as directed. 3 each 6    Continuous Blood Gluc Transmit (DEXCOM G6 TRANSMITTER) MISC 1 Device by Does not apply route as directed. 1 each  3   diltiazem (CARDIZEM CD) 360 MG 24 hr capsule Take 1 capsule (360 mg total) by mouth daily. 90 capsule 3   folic acid (FOLVITE) 1 MG tablet Take 2 tablets (2 mg total) by mouth daily. 180 tablet 3   glucose blood (CONTOUR NEXT TEST) test strip 3x daily 300 each 11   Insulin Disposable Pump (OMNIPOD DASH PODS, GEN 4,) MISC Inject 1 Dose into the skin continuous. Using pump - depends on carb intake.     insulin lispro (HUMALOG) 100 UNIT/ML injection Max Daily 100 units per pump 90 mL 3   Insulin Pen Needle (B-D UF III MINI PEN NEEDLES) 31G X 5 MM MISC USE DAILY WITH VICTOZA AND LANTUS. 400 each 1   Insulin Pen Needle 31G X 5 MM MISC 1 Device by Does not apply route 3 (three) times daily. 150 each 11   isosorbide mononitrate (IMDUR) 30 MG 24 hr tablet Take 30 mg  tablet as needed for Chest pain spasm episodes for 3 days and the stop (Patient taking differently: Take 30 mg  tablet as needed for Chest pain spasm episodes for 3 days and the stop) 20 tablet 7   Lancets 30G MISC 1 Device by Does not apply route 3 (three) times daily. 300 each 9   losartan (COZAAR) 50 MG tablet Take 1 tablet (50 mg total) by mouth daily. 90 tablet 3   Melatonin 5 MG CHEW Chew by mouth.     Multiple Vitamin (MULTIVITAMIN WITH MINERALS) TABS tablet Take 1 tablet by mouth daily.     pantoprazole (PROTONIX) 40 MG tablet Take 1 tablet (40 mg total) by mouth 2 (two) times daily before a meal. 60 tablet 11   SAFETY-LOK TB SYRINGE 27GX.5" 27G X 1/2" 1 ML MISC      traZODone (DESYREL) 50 MG tablet TAKE 0.5-1 TABLETS BY MOUTH AT BEDTIME AS NEEDED FOR SLEEP. 90 tablet 2   VICTOZA 18 MG/3ML SOPN INJECT 1.8 MG UNDER THE SKIN ONCE DAILY 15 mL 3   No current facility-administered medications on file prior to visit.    Review of Systems:  As per HPI- otherwise negative.   Physical Examination: Vitals:   07/19/21  1424  BP: 136/63  Pulse: 86  Resp: 18  Temp: 98.3 F (36.8 C)  SpO2: 100%   Vitals:   07/19/21 1424  Weight: 253 lb 6.4 oz (114.9 kg)  Height: 5\' 4"  (1.626 m)   Body mass index is 43.5 kg/m. Ideal Body Weight: Weight in (lb) to have BMI = 25: 145.3  GEN: no acute distress.  Normal self.  Obese, using oxygen She is easily winded with exertion Left nare - anterior medial aspect displays area which has recently bled.  It is not actively bleeding HEENT: Atraumatic, Normocephalic.  Ears and Nose: No external deformity. CV: RRR, No M/G/R. No JVD. No thrill. No extra heart sounds. PULM: CTA B, no wheezes, crackles, rhonchi. No retractions. No resp. distress. No accessory muscle use. ABD: S, NT, ND, +BS. No rebound. No HSM. EXTR: No c/c/e PSYCH: Normally interactive. Conversant.    Assessment and Plan: Other chronic pain - Plan: acetaminophen-codeine (TYLENOL #3) 300-30 MG tablet  Need for influenza vaccination - Plan: Flu Vaccine QUAD High Dose(Fluad)  Hypoxia  Rheumatoid arthritis, involving unspecified site, unspecified whether rheumatoid factor present ([place])  ILD (interstitial lung disease) ([place])  Nosebleed  Lynn [name] is seen today for follow-up.  She unfortunately suffers from several significant health problems including significant interstitial lung disease  on chronic oxygen, CAD and mitral valve disease which likely would benefit from valve replacement surgery, and rheumatoid arthritis causing chronic pain.  She is not currently able to use methotrexate or steroids as she is used in the past.  We discussed her pain, she is currently using only Tylenol.  She notes her pain makes it difficult for her to do things such as take a shower.  We decided try Tylenol with codeine to be used as needed for pain relief-I advised this may make her drowsy  She is stable on home oxygen, has the supplies that she needs Suspect her frequent left nare bleeds are due to chronic use of oxygen.  We  discussed having her see ENT.  We will first have her try and heal the area by applying a layer of protective petroleum jelly frequently for a few days.  If this does not resolve the bleeding I will refer her to ENT  Signed Lamar Blinks, MD

## 2021-07-19 ENCOUNTER — Other Ambulatory Visit: Payer: Self-pay

## 2021-07-19 ENCOUNTER — Ambulatory Visit (INDEPENDENT_AMBULATORY_CARE_PROVIDER_SITE_OTHER): Payer: Medicare Other | Admitting: Family Medicine

## 2021-07-19 VITALS — BP 136/63 | HR 86 | Temp 98.3°F | Resp 18 | Ht 64.0 in | Wt 253.4 lb

## 2021-07-19 DIAGNOSIS — R0902 Hypoxemia: Secondary | ICD-10-CM | POA: Diagnosis not present

## 2021-07-19 DIAGNOSIS — Z23 Encounter for immunization: Secondary | ICD-10-CM

## 2021-07-19 DIAGNOSIS — R040 Epistaxis: Secondary | ICD-10-CM

## 2021-07-19 DIAGNOSIS — M069 Rheumatoid arthritis, unspecified: Secondary | ICD-10-CM

## 2021-07-19 DIAGNOSIS — G8929 Other chronic pain: Secondary | ICD-10-CM

## 2021-07-19 DIAGNOSIS — J849 Interstitial pulmonary disease, unspecified: Secondary | ICD-10-CM

## 2021-07-19 MED ORDER — ACETAMINOPHEN-CODEINE #3 300-30 MG PO TABS: 1.0000 | ORAL_TABLET | Freq: Three times a day (TID) | ORAL | 1 refills | Status: DC | PRN

## 2021-07-19 NOTE — Patient Instructions (Signed)
Good to see you today!  Flu shot given Try the tylenol with codeine as needed for pain- this may cause drowsiness but I hope it will relieve some of your pain.  Please let me know how this works for you I think the bleeding in your left nostril is coming from the air blowing from your nasal canula.  Try keeping a layer of vaseline over this spot- you may need to apply every few hours for a few days- if this fails to heal it we can ask ENT to cauterize for you

## 2021-07-31 ENCOUNTER — Ambulatory Visit: Payer: Medicare Other | Admitting: Cardiology

## 2021-07-31 ENCOUNTER — Encounter: Payer: Self-pay | Admitting: Cardiology

## 2021-07-31 ENCOUNTER — Other Ambulatory Visit: Payer: Self-pay

## 2021-07-31 VITALS — BP 126/64 | HR 77 | Ht 63.0 in | Wt 255.0 lb

## 2021-07-31 DIAGNOSIS — E1169 Type 2 diabetes mellitus with other specified complication: Secondary | ICD-10-CM | POA: Diagnosis not present

## 2021-07-31 DIAGNOSIS — I471 Supraventricular tachycardia: Secondary | ICD-10-CM

## 2021-07-31 DIAGNOSIS — I5032 Chronic diastolic (congestive) heart failure: Secondary | ICD-10-CM | POA: Diagnosis not present

## 2021-07-31 DIAGNOSIS — I2510 Atherosclerotic heart disease of native coronary artery without angina pectoris: Secondary | ICD-10-CM

## 2021-07-31 DIAGNOSIS — I480 Paroxysmal atrial fibrillation: Secondary | ICD-10-CM | POA: Diagnosis not present

## 2021-07-31 DIAGNOSIS — E785 Hyperlipidemia, unspecified: Secondary | ICD-10-CM | POA: Diagnosis not present

## 2021-07-31 DIAGNOSIS — I10 Essential (primary) hypertension: Secondary | ICD-10-CM | POA: Diagnosis not present

## 2021-07-31 DIAGNOSIS — R0609 Other forms of dyspnea: Secondary | ICD-10-CM

## 2021-07-31 DIAGNOSIS — J9611 Chronic respiratory failure with hypoxia: Secondary | ICD-10-CM

## 2021-07-31 DIAGNOSIS — I050 Rheumatic mitral stenosis: Secondary | ICD-10-CM | POA: Diagnosis not present

## 2021-07-31 NOTE — Progress Notes (Signed)
Primary Care Provider: [name], MD Cardiologist: Glenetta Hew, MD Endocrinologist: Kelton Pillar, Melanie Crazier, MD Consulting Pulmonologist: Dr. Chase Caller GI: Dr. Fuller Plan Neurosurgery: Dr. Duffy Rhody  Clinic Note: Chief Complaint  Patient presents with   Follow-up    1 month-now off of steroids   Mitral Stenosis   Atrial Fibrillation   Chest Pain   Shortness of Breath    ===================================  ASSESSMENT/PLAN   Problem List Items Addressed This Visit       Cardiology Problems   Essential hypertension - Primary (Chronic)    Blood pressure well controlled current dose of diltiazem and losartan.      Relevant Orders   EKG 12-Lead   LEFT AND RIGHT HEART CATHETERIZATION WITH CORONARY ANGIOGRAM   Coronary artery disease, non-occlusive (Chronic)    Significant coronary calcification seen on CT scan but previous ischemic evaluations have been negative.  This been present to a coronary spasm and therefore she is on diltiazem for rate control and vasodilation.  Based on the severity of her symptoms and the need to evaluate her mitral valve disease severity, plan is to proceed with right and left heart catheterization   See consent annotation elsewhere.      Relevant Orders   EKG 16-XWRU   Basic metabolic panel (Completed)   CBC (Completed)   LEFT AND RIGHT HEART CATHETERIZATION WITH CORONARY ANGIOGRAM   PAT (paroxysmal atrial tachycardia) (HCC) (Chronic)    Short little bursts on monitor.  She is not really noticing much in the way of palpitations.  We increase diltiazem to 360 mg last visit.  We discussed triggers and vagal maneuvers.      Hyperlipidemia associated with type 2 diabetes mellitus (HCC) (Chronic)    Followed by endocrinology for her diabetes.  On high-dose high intensity statin and previously well-controlled labs.  No new labs.      Relevant Orders   LEFT AND RIGHT HEART CATHETERIZATION WITH CORONARY ANGIOGRAM    Chronic diastolic CHF (congestive heart failure) (HCC) (Chronic)    Volume status is very difficult to assess.  She does have orthopnea and PND which seems to be better.  Hard to tell how much of this is related to obesity and also her lung disease.  Plan is to proceed with right left heart catheterization and we can assess her volume status in that setting.      Relevant Orders   EKG 04-VWUJ   Basic metabolic panel (Completed)   CBC (Completed)   LEFT AND RIGHT HEART CATHETERIZATION WITH CORONARY ANGIOGRAM   Moderate mitral stenosis by prior echocardiography (Chronic)    Reviewed TEE and TTE images with Dr. Sallyanne Kuster.  He indicated that he felt that the valve did not have severe stenosis and that there was clear opening and closing of the valve.  He felt that gradients were high, but this could be artificially elevated due to tachycardia.  Would likely need to recheck once rate is better controlled.  He also indicated that surgical repair would be very tricky given the significant MAC with a large calcified mass.  For now, would like to see if he can hold off on referral for surgery of the symptoms warrant.  Plan will be to proceed with right left heart cath to exclude ischemic features given her chest pain and to assess the effect of her mitral stenosis on filling pressures.      Relevant Orders   LEFT AND RIGHT HEART CATHETERIZATION WITH CORONARY ANGIOGRAM   PAF (paroxysmal  atrial fibrillation) (HCC) (Chronic)    Very low-1% A. fib burden on monitor.  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  She is due to have EGD checked early next month, and we are awaiting recommendation from neurosurgery about the AVMs.  Currently not on aspirin or DOAC.  Okay to treat with aspirin on the morning of cardiac catheterization.  Continue diltiazem at 360 mg for rate control.  In the absence of significant recurrence, we will hold off  on antiarrhythmics.      Relevant Orders   EKG 12-Lead   LEFT AND RIGHT HEART CATHETERIZATION WITH CORONARY ANGIOGRAM     Other   Chronic respiratory failure with hypoxia (HCC)   Relevant Orders   EKG 12-Lead   LEFT AND RIGHT HEART CATHETERIZATION WITH CORONARY ANGIOGRAM   Dyspnea on exertion (Chronic)    Still multifactorial.  But with having diffuse chest pains and dyspnea, we are going to exclude CAD and evaluate the extent of her mitral valve disease with effect on pressures.  Plan: Right and Left Heart Catheterization  Shared Decision Making/Informed Consent The risks [stroke (1 in 1000), death (1 in 1000), kidney failure [usually temporary] (1 in 500), bleeding (1 in 200), allergic reaction [possibly serious] (1 in 200)], benefits (diagnostic support and management of coronary artery disease) and alternatives of a cardiac catheterization were discussed in detail with Lynn [name] and she is willing to proceed.      Relevant Orders   EKG 41-YSAY   Basic metabolic panel (Completed)   CBC (Completed)   LEFT AND RIGHT HEART CATHETERIZATION WITH CORONARY ANGIOGRAM   Morbid obesity due to excess calories (HCC) (Chronic)    Likely contributing to her dyspnea both exertional, and orthopnea.  Unfortunately, she is not able to do any exercise right now with a combination of her dyspnea and arthritis pains.      ===================================  HPI:    Lynn [name] is a 65 y.o. female with a PMH below who presents today for 3-week follow-up.  Notable PMH: Mitral Stenosis with Severe MAC-with echocardiogram and MRI noting quite posterior leaflet mass that is simply calcification. 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  During that evaluation she was evaluated for FUO and underwent TEE as well as cardiac MRI to assess the mitral valve mass that ended up being a calcified mass.  She was having significant diffuse arthralgias and myalgias since she was no longer on methotrexate.  She was on high-dose steroid taper that is now fully weaned off.  I wanted to allow her to fully wean off of the steroids and improve her lung function before we consider right left heart cath. --: Increase diltiazem to 360 mg daily, reduce losartan to 50 mg to allow for change in blood pressure   Recent Hospitalizations: See detailed summary from 07/05/2021 note  Follow-up virtual visit with neurosurgery 07/10/2021: Unfortunately, the note does not explain recommendations.  Has upcoming EGD scheduled for November 1.  Reviewed  CV  studies:    The following studies were reviewed today: (if available, images/films reviewed: From Epic Chart or Care Everywhere) No new studies:   Interval History:   Lynn [name] returns here today somewhat in better spirits, but still has her dyspnea requiring oxygen that is worse with any minimal exertion.  She really gets short of breath when spent anything.  The chest pain is still there, but not as bad.  She is having a hard time distinguishing any pain from her arthritis pains.  She has been "hurting all over" since being off the methotrexate, and now that she is off of the prednisone taper.  She feels bloated but also feels like she is probably not been doing a good job eating.  She has been very sedentary.  It is hard to tell if she is having exertional chest pain.  No real significant edema but she does have PND and orthopnea.   She has resting dyspnea that gets worse with exertion. Palpitations seem to be better controlled.  She does not seem like she has had any prolonged episodes.  This seems to have improved with the decreased dose of diltiazem.  Thankfully, no episodes of melena, hematochezia, hematuria or epistaxis.  CV Review of Symptoms (Summary) Cardiovascular ROS: positive for - chest pain, dyspnea on exertion, irregular heartbeat, orthopnea, palpitations, shortness of breath, and significant diffuse body pains.  Activity limited by arthralgias/myalgias but also dyspnea. negative for - paroxysmal nocturnal dyspnea, rapid heart rate, or syncope/near syncope or TIA/amaurosis fugax, claudication no real true edema.  She is accompanied by her husband today.  He had multiple questions that were asked and answers.  We reviewed the results of her studies.  REVIEWED OF SYSTEMS   Review of Systems  Constitutional:  Positive for malaise/fatigue. Negative for weight loss.  HENT:  Negative for congestion.   Respiratory:  Positive for cough, shortness of breath and wheezing.        She thinks this may be getting better.  Less cough  Cardiovascular:        Per HPI  Gastrointestinal:  Negative for blood in stool, diarrhea and melena.  Genitourinary:  Negative for hematuria.  Musculoskeletal:  Positive for back pain, joint pain and myalgias. Negative for falls.  Neurological:  Positive for dizziness.  Psychiatric/Behavioral:  Negative for depression and memory loss. The patient is nervous/anxious. The patient does not have insomnia.    I have reviewed and (if needed) personally updated the patient's problem list, medications, allergies, past medical and surgical history, social and family history.   PAST MEDICAL HISTORY   Past Medical History:  Diagnosis Date   Anxiety    Arthritis    "knees, wrists, back, elbows" (07/03/2017)   Clotting disorder (HCC)    blood clot in eye 2016   Coronary artery disease,  non-occlusive    a. 11/2016 NSTEMI/Cath: LM nl, LAD 40p, 50md, D1/2 small, LCX 40ost, OM2/3 nl/small, RCA 35 ost/mid, RPDA/RPL small/nl.   Coronary vasospasm (HCC)    Depression    Diastolic dysfunction    a. 11/2016 Echo: EF 55-60%, gr1 DD, sev calcified MV annulus, mildly dil LA.   Eye hemorrhage 01/2015   "right; resolved" (07/03/2017)   Headache    "monthly" (07/03/2017)   History of blood transfusion    "when I had an ectopic pregnancy"   History of stomach ulcers    Hyperlipidemia    Hypertension    Microcytic anemia    Moderate mitral stenosis by prior   echocardiogram 03/2021   TTE: Moderate MS (mean gradient 9 mmHg). -- TEE Moderate-Severe MS (mean gradient 14 mmHg) with fixed posterior leaflet related to severe MAC with large circumscribed calcified mass on the posterior leaflet. (Mass previously described in 2018) - CMRI identifies calcified mass & not Tumor.   OSA on CPAP    setting is unknown   PAF (paroxysmal atrial fibrillation) ([place]) 03/2021   Event Monitor Aug-Sept 2022: Predominantly sinus rhythm.  1% A. fib burden.  30 brief episodes of PAT.  2 short bursts of NSVT.   PAT (paroxysmal atrial tachycardia) ([place])    Event monitor from August 2022 showed 30 brief bursts longest 14 seconds.   Pneumonia    "several times" (07/03/2017)   PVC's (premature ventricular contractions)    Syncope 07/03/2017 X 2   called seizures but no medications   Thalassemia    "my cells are sickle cell shaped but I don't have sickle cell anemia" (07/03/2017)   Type II diabetes mellitus ([place])     PAST SURGICAL HISTORY   Past Surgical History:  Procedure Laterality Date   48-Hour Monitor  03/18/2017   Sinus rhythm with sinus tachycardia (rate 58-134 BPM)multiple PVCs noted with couplets and bigeminy. One triplet. 7 runs of PAT ranging from 100-130 bpm. Longest was 33 beats.   BIOPSY  04/03/2021   Procedure: BIOPSY;  Surgeon: Yetta Flock, MD;  Location: [facility];  Service:  Gastroenterology;;   BREAST BIOPSY Left    BRONCHIAL WASHINGS  03/28/2021   Procedure: BRONCHIAL WASHINGS;  Surgeon: Julian Hy, DO;  Location: [place];  Service: Endoscopy;;   CARDIAC CATHETERIZATION N/A 11/19/2016   Procedure: Left Heart Cath and Coronary Angiography;  Surgeon: Belva Crome, MD;  Location: [facility];  Service: Cardiovascular.    LM nl, LAD 40p, 8m, D1/2 small, LCX 40ost, OM2/3 nl/small, RCA 35 ost/mid, RPDA/RPL small/nl.   CARDIAC MRI  03/30/2021   Normal LVEF ~53%. 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 No Scar or Late Gadolinium Enhancement on LV Myocardium.   CARPAL TUNNEL RELEASE Right 11/01/2014   COLONOSCOPY     DILATION AND CURETTAGE OF UTERUS     ECTOPIC PREGNANCY SURGERY  X 2   ESOPHAGOGASTRODUODENOSCOPY (EGD) WITH PROPOFOL N/A 04/03/2021   Procedure: ESOPHAGOGASTRODUODENOSCOPY (EGD) WITH PROPOFOL;  Surgeon: AYetta Flock MD;  Location: [facility];  Service: Gastroenterology;  Laterality: N/A;   JOINT REPLACEMENT     KNEE ARTHROSCOPY Left 11/2014   TEE WITHOUT CARDIOVERSION N/A 03/28/2021   Procedure: TRANSESOPHAGEAL ECHOCARDIOGRAM (TEE);  Surgeon: NThayer Headings MD;  [facility];; LVEF 55-60%. Normal LV Fxn & no RWMA. No LAA thrombus. Gr II Ao Plaque. Large circumscribed calcific mass (1.8 x 1.4 cm) anterior to posterior annulus.  Fixed posterior leaflet with Mod-Severe MS (Mean MVG ~14 mmHg, Peak 20 mmhg).  Mild MR   TONSILLECTOMY     TOTAL KNEE ARTHROPLASTY Right 02/18/2015   Procedure: RIGHT TOTAL KNEE ARTHROPLASTY;  Surgeon: SNetta Cedars MD;  [place] [facility]  Service: [place]opedics;  Laterality: Right;   TOTAL KNEE ARTHROPLASTY Left 08/19/2015   Procedure: LEFT TOTAL KNEE ARTHROPLASTY;  Surgeon: SNetta Cedars MD;  [place] [place]  Service: Orthopedics;  Laterality: Left;   TRANSTHORACIC ECHOCARDIOGRAM  11/2016; 07/2017:   a) normal LV size, thickness and function. EF  55-60%. GR 1 DD.No RWMA severe mitral annular calcification, but no mitral stenosis. Mild LA dilation;; b) normal LV size and  function.  EF 65-75%.  Unable to assess diastolic function.  Aortic sclerosis with no stenosis.  Moderate mitral stenosis? (Gradient 9 mmHg) -?  Mild-mod left atrial enlargement    TRANSTHORACIC ECHOCARDIOGRAM  01/2019   EF 65 to 70%.  Normal function.  Elevated LVEDP-GR 1 DD.  Normal RV size and function.  Large calcific mass on posterior mitral leaflet mild to moderate mitral stenosis.   TRANSTHORACIC ECHOCARDIOGRAM  03/24/2021   EF 60 to 65%.  LV function with normal wall motion.  Moderate basal septal LVH.  GRII DD & Mild LA dilation.-> elevated LVEDP.  Normal RV function.  Mildly elevated PAP.  Ill-defined density measuring 3.23 x 2.1 cm region of the posterior MV leaflet along with dense MAC.  Moderate MS (mean MVG of 9 mmHg.) Normal AoV & RAP. --> Recommend TEE 2/2 concern for MV SBE.   VAGINAL HYSTERECTOMY     VIDEO BRONCHOSCOPY N/A 03/28/2021   Procedure: VIDEO BRONCHOSCOPY WITHOUT FLUORO;  Surgeon: Julian Hy, DO;  Location: [facility];  Service: Endoscopy;  Laterality: N/A;   Event Monitor 8-06/2021 (ordered to assess AFib Burden - 21 days, 2 separate monitors) Overall predominantly sinus rhythm with minimum heart rate of 55 bpm, maximum 140 bpm and average of 81 bpm. Atrial fibrillation was seen with a 1% burden, longest duration was 1 hour 5 minutes. Heart rate ranged from 53 to 117 bpm. 30 episodes of brief supraventricular tachycardia/atrial tachycardia: Fastest heart rate 156 bpm 6 beats. Longest was 14 seconds with an average heart rate of 115 bpm. 2 Short Episodes of Nonsustained Ventricular Tachycardia longest and fastest run was average 140 bpm with a high of 167 bpm for ~ 9 beats  Immunization History  Administered Date(s) Administered   Fluad Quad(high Dose 65+) 07/19/2021   Influenza Split 07/22/2012   Influenza, High Dose Seasonal PF 06/10/2019    Influenza,inj,Quad PF,6+ Mos 10/09/2014, 07/13/2015, 11/17/2016, 07/04/2017, 07/10/2018   Influenza-Unspecified 06/20/2020   Moderna Sars-Covid-2 Vaccination 11/27/2019, 12/26/2019, 06/22/2020, 12/30/2020, 06/21/2021   Pneumococcal Conjugate-13 03/23/2019   Pneumococcal Polysaccharide-23 08/22/2020   Pneumococcal-Unspecified 02/12/2010   Tdap 02/12/2010, 10/17/2016   Zoster Recombinat (Shingrix) 02/20/2021    MEDICATIONS/ALLERGIES   Current Meds  Medication Sig   acetaminophen (TYLENOL) 500 MG tablet Take 500 mg by mouth every 6 (six) hours as needed for fever.   acetaminophen-codeine (TYLENOL #3) 300-30 MG tablet Take 1-2 tablets by mouth every 8 (eight) hours as needed for moderate pain.   albuterol (VENTOLIN HFA) 108 (90 Base) MCG/ACT inhaler Inhale 2 puffs into the lungs every 6 (six) hours as needed for wheezing or shortness of breath.   atorvastatin (LIPITOR) 80 MG tablet TAKE 1 TABLET(80 MG) BY MOUTH DAILY AT 6 PM   cetirizine (ZYRTEC) 10 MG tablet Take 10 mg by mouth daily as needed for allergies.   Continuous Blood Gluc Sensor (DEXCOM G6 SENSOR) MISC 1 Device by Does not apply route as directed.   Continuous Blood Gluc Transmit (DEXCOM G6 TRANSMITTER) MISC 1 Device by Does not apply route as directed.   diltiazem (CARDIZEM CD) 360 MG 24 hr capsule Take 1 capsule (360 mg total) by mouth daily.   folic acid (FOLVITE) 1 MG tablet Take 2 tablets (2 mg total) by mouth daily.   glucose blood (CONTOUR NEXT TEST) test strip 3x daily   Insulin Disposable Pump (OMNIPOD DASH PODS, GEN 4,) MISC Inject 1 Dose into the skin continuous. Using pump - depends on carb intake.   insulin lispro (HUMALOG) 100  UNIT/ML injection Max Daily 100 units per pump   Insulin Pen Needle (B-D UF III MINI PEN NEEDLES) 31G X 5 MM MISC USE DAILY WITH VICTOZA AND LANTUS.   Insulin Pen Needle 31G X 5 MM MISC 1 Device by Does not apply route 3 (three) times daily.   Lancets 30G MISC 1 Device by Does not apply route  3 (three) times daily.   losartan (COZAAR) 50 MG tablet Take 1 tablet (50 mg total) by mouth daily.   Melatonin 5 MG CHEW Chew by mouth.   Multiple Vitamin (MULTIVITAMIN WITH MINERALS) TABS tablet Take 1 tablet by mouth daily.   pantoprazole (PROTONIX) 40 MG tablet Take 1 tablet (40 mg total) by mouth 2 (two) times daily before a meal.   SAFETY-LOK TB SYRINGE 27GX.5" 27G X 1/2" 1 ML MISC    traZODone (DESYREL) 50 MG tablet TAKE 0.5-1 TABLETS BY MOUTH AT BEDTIME AS NEEDED FOR SLEEP.   VICTOZA 18 MG/3ML SOPN INJECT 1.8 MG UNDER THE SKIN ONCE DAILY    Allergies  Allergen Reactions   Penicillins Hives    Childhood allergy Has patient had a PCN reaction causing immediate rash, facial/tongue/throat swelling, SOB or lightheadedness with hypotension: Yes Has patient had a PCN reaction causing severe rash involving mucus membranes or skin necrosis: No Has patient had a PCN reaction that required hospitalization No Has patient had a PCN reaction occurring within the last 10 years: No If all of the above answers are "NO", then may proceed with Cephalosporin use.   Metformin And Related Other (See Comments)    Diarrhea, bleeding   Farxiga [Dapagliflozin] Other (See Comments)    Dizzy and lethargic     SOCIAL HISTORY/FAMILY HISTORY   Reviewed in Epic:  Pertinent findings:  Social History   Tobacco Use   Smoking status: Former    Packs/day: 0.25    Years: 44.00    Pack years: 11.00    Types: Cigarettes    Quit date: 02/17/2015    Years since quitting: 6.4   Smokeless tobacco: Never  Vaping Use   Vaping Use: Never used  Substance Use Topics   Alcohol use: Not Currently    Comment: 07/03/2017 "might have a few drinks/year"   Drug use: No   Social History   Social History Narrative   Not on file    OBJCTIVE -PE, EKG, labs   Wt Readings from Last 3 Encounters:  07/31/21 255 lb (115.7 kg)  07/19/21 253 lb 6.4 oz (114.9 kg)  07/07/21 255 lb (115.7 kg)    Physical Exam: BP  126/64 (BP Location: Right Arm, Patient Position: Sitting, Cuff Size: Large)   Pulse 77   Ht _0  (1.6 m)   Wt 255 lb (115.7 kg)   LMP  (LMP Unknown)   SpO2 99%   BMI 45.17 kg/m  Physical Exam Vitals reviewed.  Constitutional:      Appearance: She is not ill-appearing or toxic-appearing.     Comments: Morbidly obese.  Well-groomed.  No obvious distress.  HENT:     Head: Normocephalic and atraumatic.  Neck:     Vascular: No carotid bruit or JVD (Really difficult assess).  Cardiovascular:     Rate and Rhythm: Normal rate and regular rhythm. Occasional Extrasystoles are present.    Pulses: Intact distal pulses. No decreased pulses (Unable to palpate).     Heart sounds: S1 normal and S2 normal. Heart sounds are distant. Murmur heard.  Low-pitched rumbling crescendo presystolic murmur is present  with a grade of 1/4 at the apex.    No friction rub. No gallop.  Pulmonary:     Effort: Pulmonary effort is normal.     Breath sounds: No wheezing, rhonchi or rales.     Comments: Notably improved work of breathing.  Distant breath sounds but no rales or rhonchi.  Mild expiratory wheeze. Chest:     Chest wall: Tenderness present.  Musculoskeletal:        General: No swelling. Normal range of motion.     Cervical back: Normal range of motion and neck supple.  Neurological:     General: No focal deficit present.     Mental Status: She is oriented to person, place, and time.  Psychiatric:        Mood and Affect: Mood normal.        Behavior: Behavior normal.        Thought Content: Thought content normal.        Judgment: Judgment normal.    Adult ECG Report  Rate: 77 ;  Rhythm: normal sinus rhythm and low voltage.  Anterior Mikan age-indeterminate. ;   Narrative Interpretation: Relatively stable  Recent Labs: Labs ordered today Lab Results  Component Value Date   CHOL 97 08/22/2020   HDL 38.80 (L) 08/22/2020   LDLCALC 44 08/22/2020   LDLDIRECT 131 (H) 09/02/2013   TRIG 74.0  08/22/2020   CHOLHDL 3 08/22/2020   Lab Results  Component Value Date   CREATININE WILL FOLLOW 07/31/2021   BUN WILL FOLLOW 07/31/2021   NA WILL FOLLOW 07/31/2021   K WILL FOLLOW 07/31/2021   CL WILL FOLLOW 07/31/2021   CO2 WILL FOLLOW 07/31/2021   CBC Latest Ref Rng & Units 07/31/2021 05/18/2021 04/05/2021  WBC 3.4 - 10.8 x10E3/uL 9.4 15.8(H) 17.8(H)  Hemoglobin 11.1 - 15.9 g/dL 9.3(L) 9.3(L) 9.5(L)  Hematocrit 34.0 - 46.6 % 30.5(L) 30.6(L) 31.6(L)  Platelets 150 - 450 x10E3/uL 346 306 336.0    Lab Results  Component Value Date   HGBA1C 8.3 (A) 06/09/2021   Lab Results  Component Value Date   TSH 4.01 03/20/2021    ==================================================  COVID-19 Education: The signs and symptoms of COVID-19 were discussed with the patient and how to seek care for testing (follow up with PCP or arrange E-visit).    I spent a total of 58 minutes with the patient spent in direct patient consultation.  Additional time spent with chart review  / charting (studies, outside notes, etc): 34 min -> Chart reviewed, images reviewed with Dr. Croitoru. Total Time: 92 min  Current medicines are reviewed at length with the patient today.  (+/- concerns) n/a  This visit occurred during the SARS-CoV-2 public health emergency.  Safety protocols were in place, including screening questions prior to the visit, additional usage of staff PPE, and extensive cleaning of exam room while observing appropriate contact time as indicated for disinfecting solutions.  Notice: This dictation was prepared with Dragon dictation along with smart phrase technology. Any transcriptional errors that result from this process are unintentional and may not be corrected upon review.  Patient Instructions / Medication Changes & Studies & Tests Ordered   Patient Instructions  Medication Instructions:  NO CHANGES *If you need a refill on your cardiac medications before your next appointment, please  call your pharmacy*   Lab Work: BMP CBC If you have labs (blood work) drawn today and your tests are completely normal, you will receive your results only by: MyChart Message (if   you have MyChart) OR A paper copy in the mail If you have any lab test that is abnormal or we need to change your treatment, we will call you to review the results.   Testing/Procedures: Will be schedule at 08/08/21  [facility]  Cardiac Cath Lab  Your physician has requested that you have a cardiac catheterization. Cardiac catheterization is used to diagnose and/or treat various heart conditions. Doctors may recommend this procedure for a number of different reasons. The most common reason is to evaluate chest pain. Chest pain can be a symptom of coronary artery disease (CAD), and cardiac catheterization can show whether plaque is narrowing or blocking your heart's arteries. This procedure is also used to evaluate the valves, as well as measure the blood flow and oxygen levels in different parts of your heart. For further information please visit [email or web address]. Please follow instruction sheet, as given.    Follow-Up: At [facility], you and your health needs are our priority.  As part of our continuing mission to provide you with exceptional heart care, we have created designated Provider Care Teams.  These Care Teams include your primary Cardiologist (physician) and Advanced Practice Providers (APPs -  Physician Assistants and Nurse Practitioners) who all work together to provide you with the care you need, when you need it.  We recommend signing up for the patient portal called "MyChart".  Sign up information is provided on this After Visit Summary.  MyChart is used to connect with patients for Virtual Visits (Telemedicine).  Patients are able to view lab/test results, encounter notes, upcoming appointments, etc.  Non-urgent messages can be sent to your provider as well.   To learn more about what you can do with  MyChart, go to [email or web address].    Your next appointment:   2 week(s)  The format for your next appointment:   In Person  Provider:   Glenetta Hew, MD   Other Instructions   [address] [place] [address] Dept: [phone] Loc: [phone]  Lynn [name]  07/31/2021  You are scheduled for a Cardiac Catheterization on Tuesday, October 25 with Dr. Glenetta Hew.  1. Please arrive at the [facility] (Main Entrance A) at [facility]: [address] [place], [place] [address] at 10:00 AM (This time is two hours before your procedure to ensure your preparation). Free valet parking service is available.   Special note: Every effort is made to have your procedure done on time. Please understand that emergencies sometimes delay scheduled procedures.  2. Diet: Do not eat solid foods after midnight.  The patient may have clear liquids until 5am upon the day of the procedure.  3. Labs: You will need to have blood drawn _ BMP ,CBC   4. Medication instructions in preparation for your procedure:    Take insulin  pump pod off the night before.   Do not that Victoza the morning of procedure  Stop taking, Cozaar (Losartan) Tuesday, October 25,   On the morning of your procedure, take your Aspirin 81 mg morning of only and any morning medicines NOT listed above.  You may use sips of water.  5. Plan for one night stay--bring personal belongings. 6. Bring a current list of your medications and current insurance cards. 7. You MUST have a responsible person to drive you home. 8. Someone MUST be with you the first 24 hours after you arrive home or your discharge  will be delayed. 9. Please wear clothes that are easy to get on and off and wear slip-on shoes.  Thank you for allowing [place] to care for you!   --  Invasive Cardiovascular services     Studies Ordered:   Orders Placed This Encounter  Procedures   Basic metabolic panel   CBC   EKG 12-Lead   LEFT AND RIGHT HEART CATHETERIZATION WITH CORONARY Illene Silver, M.D., M.S. Interventional Cardiologist   Pager # [phone] Phone # [phone] [address] [place], [place] [address]   Thank you for choosing Heartcare at [facility]!!

## 2021-07-31 NOTE — Assessment & Plan Note (Signed)
Very low-1% A. fib burden on monitor.  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  She is due to have EGD checked early next month, and we are awaiting recommendation from neurosurgery about the AVMs.  Currently not on aspirin or DOAC.  Okay to treat with aspirin on the morning of cardiac catheterization.  Continue diltiazem at 360 mg for rate control.  In the absence of significant recurrence, we will hold off on antiarrhythmics.

## 2021-07-31 NOTE — Assessment & Plan Note (Signed)
Followed by endocrinology for her diabetes.  On high-dose high intensity statin and previously well-controlled labs.  No new labs.

## 2021-07-31 NOTE — Assessment & Plan Note (Signed)
Reviewed TEE and TTE images with Dr. Sallyanne Kuster.  He indicated that he felt that the valve did not have severe stenosis and that there was clear opening and closing of the valve.  He felt that gradients were high, but this could be artificially elevated due to tachycardia.  Would likely need to recheck once rate is better controlled.  He also indicated that surgical repair would be very tricky given the significant MAC with a large calcified mass.  For now, would like to see if he can hold off on referral for surgery of the symptoms warrant.  Plan will be to proceed with right left heart cath to exclude ischemic features given her chest pain and to assess the effect of her mitral stenosis on filling pressures.

## 2021-07-31 NOTE — Assessment & Plan Note (Addendum)
Likely contributing to her dyspnea both exertional, and orthopnea.  Unfortunately, she is not able to do any exercise right now with a combination of her dyspnea and arthritis pains.

## 2021-07-31 NOTE — Assessment & Plan Note (Signed)
Significant coronary calcification seen on CT scan but previous ischemic evaluations have been negative.  This been present to a coronary spasm and therefore she is on diltiazem for rate control and vasodilation.  Based on the severity of her symptoms and the need to evaluate her mitral valve disease severity, plan is to proceed with right and left heart catheterization   See consent annotation elsewhere.

## 2021-07-31 NOTE — H&P (View-Only) (Signed)
Primary Care Provider: [name], MD Cardiologist: Lynn Hew, MD Endocrinologist: Lynn [name], Lynn Crazier, MD Consulting Pulmonologist: Dr. Chase [name] GI: Dr. Fuller [name] Neurosurgery: Dr. Duffy [name]  Clinic Note: Chief Complaint  Patient presents with   Follow-up    1 month-now off of steroids   Mitral Stenosis   Atrial Fibrillation   Chest Pain   Shortness of Breath    ===================================  ASSESSMENT/[name]   Problem List Items Addressed This Visit       Cardiology Problems   Essential hypertension - Primary (Chronic)    Blood pressure well controlled current dose of diltiazem and losartan.      Relevant Orders   EKG 12-Lead   LEFT AND RIGHT HEART CATHETERIZATION WITH CORONARY ANGIOGRAM   Coronary artery disease, non-occlusive (Chronic)    Significant coronary calcification seen on CT scan but previous ischemic evaluations have been negative.  This been present to a coronary spasm and therefore she is on diltiazem for rate control and vasodilation.  Based on the severity of her symptoms and the need to evaluate her mitral valve disease severity, [name] is to proceed with right and left heart catheterization   See consent annotation elsewhere.      Relevant Orders   EKG 95-MWUX   Basic metabolic panel (Completed)   CBC (Completed)   LEFT AND RIGHT HEART CATHETERIZATION WITH CORONARY ANGIOGRAM   PAT (paroxysmal atrial tachycardia) (HCC) (Chronic)    Short little bursts on monitor.  She is not really noticing much in the way of palpitations.  We increase diltiazem to 360 mg last visit.  We discussed triggers and vagal maneuvers.      Hyperlipidemia associated with type 2 diabetes mellitus (HCC) (Chronic)    Followed by endocrinology for her diabetes.  On high-dose high intensity statin and previously well-controlled labs.  No new labs.      Relevant Orders   LEFT AND RIGHT HEART CATHETERIZATION WITH CORONARY ANGIOGRAM    Chronic diastolic CHF (congestive heart failure) (HCC) (Chronic)    Volume status is very difficult to assess.  She does have orthopnea and PND which seems to be better.  Hard to tell how much of this is related to obesity and also her lung disease.  [name] is to proceed with right left heart catheterization and we can assess her volume status in that setting.      Relevant Orders   EKG 32-GMWN   Basic metabolic panel (Completed)   CBC (Completed)   LEFT AND RIGHT HEART CATHETERIZATION WITH CORONARY ANGIOGRAM   Moderate mitral stenosis by prior echocardiography (Chronic)    Reviewed TEE and TTE images with Dr. Sallyanne [name].  He indicated that he felt that the valve did not have severe stenosis and that there was clear opening and closing of the valve.  He felt that gradients were high, but this could be artificially elevated due to tachycardia.  Would likely need to recheck once rate is better controlled.  He also indicated that surgical repair would be very tricky given the significant MAC with a large calcified mass.  For now, would like to see if he can hold off on referral for surgery of the symptoms warrant.  [name] will be to proceed with right left heart cath to exclude ischemic features given her chest pain and to assess the effect of her mitral stenosis on filling pressures.      Relevant Orders   LEFT AND RIGHT HEART CATHETERIZATION WITH CORONARY ANGIOGRAM   PAF (paroxysmal  atrial fibrillation) (HCC) (Chronic)    Very low-1% A. fib burden on monitor.  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  She is due to have EGD checked early next month, and we are awaiting recommendation from neurosurgery about the AVMs.  Currently not on aspirin or DOAC.  Okay to treat with aspirin on the morning of cardiac catheterization.  Continue diltiazem at 360 mg for rate control.  In the absence of significant recurrence, we will hold off  on antiarrhythmics.      Relevant Orders   EKG 12-Lead   LEFT AND RIGHT HEART CATHETERIZATION WITH CORONARY ANGIOGRAM     Other   Chronic respiratory failure with hypoxia (HCC)   Relevant Orders   EKG 12-Lead   LEFT AND RIGHT HEART CATHETERIZATION WITH CORONARY ANGIOGRAM   Dyspnea on exertion (Chronic)    Still multifactorial.  But with having diffuse chest pains and dyspnea, we are going to exclude CAD and evaluate the extent of her mitral valve disease with effect on pressures.  [name]: Right and Left Heart Catheterization  Shared Decision Making/Informed Consent The risks [stroke (1 in 1000), death (1 in 1000), kidney failure [usually temporary] (1 in 500), bleeding (1 in 200), allergic reaction [possibly serious] (1 in 200)], benefits (diagnostic support and management of coronary artery disease) and alternatives of a cardiac catheterization were discussed in detail with Lynn [name] and she is willing to proceed.      Relevant Orders   EKG 17-GYFV   Basic metabolic panel (Completed)   CBC (Completed)   LEFT AND RIGHT HEART CATHETERIZATION WITH CORONARY ANGIOGRAM   Morbid obesity due to excess calories (HCC) (Chronic)    Likely contributing to her dyspnea both exertional, and orthopnea.  Unfortunately, she is not able to do any exercise right now with a combination of her dyspnea and arthritis pains.      ===================================  HPI:    Lynn [name] is a 65 y.o. female with a PMH below who presents today for 3-week follow-up.  Notable PMH: Mitral Stenosis with Severe MAC-with echocardiogram and MRI noting quite posterior leaflet mass that is simply calcification. 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  During that evaluation she was evaluated for FUO and underwent TEE as well as cardiac MRI to assess the mitral valve mass that ended up being a calcified mass.  She was having significant diffuse arthralgias and myalgias since she was no longer on methotrexate.  She was on high-dose steroid taper that is now fully weaned off.  I wanted to allow her to fully wean off of the steroids and improve her lung function before we consider right left heart cath. --: Increase diltiazem to 360 mg daily, reduce losartan to 50 mg to allow for change in blood pressure   Recent Hospitalizations: See detailed summary from 07/05/2021 note  Follow-up virtual visit with neurosurgery 07/10/2021: Unfortunately, the note does not explain recommendations.  Has upcoming EGD scheduled for November 1.  Reviewed  CV  studies:    The following studies were reviewed today: (if available, images/films reviewed: From Epic Chart or Care Everywhere) No new studies:   Interval History:   Lynn [name] returns here today somewhat in better spirits, but still has her dyspnea requiring oxygen that is worse with any minimal exertion.  She really gets short of breath when spent anything.  The chest pain is still there, but not as bad.  She is having a hard time distinguishing any pain from her arthritis pains.  She has been "hurting all over" since being off the methotrexate, and now that she is off of the prednisone taper.  She feels bloated but also feels like she is probably not been doing a good job eating.  She has been very sedentary.  It is hard to tell if she is having exertional chest pain.  No real significant edema but she does have PND and orthopnea.   She has resting dyspnea that gets worse with exertion. Palpitations seem to be better controlled.  She does not seem like she has had any prolonged episodes.  This seems to have improved with the decreased dose of diltiazem.  Thankfully, no episodes of melena, hematochezia, hematuria or epistaxis.  CV Review of Symptoms (Summary) Cardiovascular ROS: positive for - chest pain, dyspnea on exertion, irregular heartbeat, orthopnea, palpitations, shortness of breath, and significant diffuse body pains.  Activity limited by arthralgias/myalgias but also dyspnea. negative for - paroxysmal nocturnal dyspnea, rapid heart rate, or syncope/near syncope or TIA/amaurosis fugax, claudication no real true edema.  She is accompanied by her husband today.  He had multiple questions that were asked and answers.  We reviewed the results of her studies.  REVIEWED OF SYSTEMS   Review of Systems  Constitutional:  Positive for malaise/fatigue. Negative for weight loss.  HENT:  Negative for congestion.   Respiratory:  Positive for cough, shortness of breath and wheezing.        She thinks this may be getting better.  Less cough  Cardiovascular:        Per HPI  Gastrointestinal:  Negative for blood in stool, diarrhea and melena.  Genitourinary:  Negative for hematuria.  Musculoskeletal:  Positive for back pain, joint pain and myalgias. Negative for falls.  Neurological:  Positive for dizziness.  Psychiatric/Behavioral:  Negative for depression and memory loss. The patient is nervous/anxious. The patient does not have insomnia.    I have reviewed and (if needed) personally updated the patient's problem list, medications, allergies, past medical and surgical history, social and family history.   PAST MEDICAL HISTORY   Past Medical History:  Diagnosis Date   Anxiety    Arthritis    "knees, wrists, back, elbows" (07/03/2017)   Clotting disorder ([place])    blood clot in eye 2016   Coronary artery disease,  non-occlusive    a. 11/2016 NSTEMI/Cath: LM nl, LAD 40p, 52m, D1/2 small, LCX 40ost, OM2/3 nl/small, RCA 35 ost/mid, RPDA/RPL small/nl.   Coronary vasospasm (HCC)    Depression    Diastolic dysfunction    a. 11/2016 Echo: EF 55-60%, gr1 DD, sev calcified MV annulus, mildly dil LA.   Eye hemorrhage 01/2015   "right; resolved" (07/03/2017)   Headache    "monthly" (07/03/2017)   History of blood transfusion    "when I had an ectopic pregnancy"   History of stomach ulcers    Hyperlipidemia    Hypertension    Microcytic anemia    Moderate mitral stenosis by prior  echocardiogram 03/2021   TTE: Moderate MS (mean gradient 9 mmHg). -- TEE Moderate-Severe MS (mean gradient 14 mmHg) with fixed posterior leaflet related to severe MAC with large circumscribed calcified mass on the posterior leaflet. (Mass previously described in 2018) - CMRI identifies calcified mass & not Tumor.   OSA on CPAP    setting is unknown   PAF (paroxysmal atrial fibrillation) ([place]) 03/2021   Event Monitor Aug-Sept 2022: Predominantly sinus rhythm.  1% A. fib burden.  30 brief episodes of PAT.  2 short bursts of NSVT.   PAT (paroxysmal atrial tachycardia) ([place])    Event monitor from August 2022 showed 30 brief bursts longest 14 seconds.   Pneumonia    "several times" (07/03/2017)   PVC's (premature ventricular contractions)    Syncope 07/03/2017 X 2   called seizures but no medications   Thalassemia    "my cells are sickle cell shaped but I don't have sickle cell anemia" (07/03/2017)   Type II diabetes mellitus ([place])     PAST SURGICAL HISTORY   Past Surgical History:  Procedure Laterality Date   48-Hour Monitor  03/18/2017   Sinus rhythm with sinus tachycardia (rate 58-134 BPM)multiple PVCs noted with couplets and bigeminy. One triplet. 7 runs of PAT ranging from 100-130 bpm. Longest was 33 beats.   BIOPSY  04/03/2021   Procedure: BIOPSY;  Surgeon: Yetta Flock, MD;  Location: [facility];  Service:  Gastroenterology;;   BREAST BIOPSY Left    BRONCHIAL WASHINGS  03/28/2021   Procedure: BRONCHIAL WASHINGS;  Surgeon: Julian Hy, DO;  Location: [facility];  Service: Endoscopy;;   CARDIAC CATHETERIZATION N/A 11/19/2016   Procedure: Left Heart Cath and Coronary Angiography;  Surgeon: Belva Crome, MD;  Location: [facility];  Service: Cardiovascular.    LM nl, LAD 40p, 8m, D1/2 small, LCX 40ost, OM2/3 nl/small, RCA 35 ost/mid, RPDA/RPL small/nl.   CARDIAC MRI  03/30/2021   Normal LVEF ~53%. 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 No Scar or Late Gadolinium Enhancement on LV Myocardium.   CARPAL TUNNEL RELEASE Right 11/01/2014   COLONOSCOPY     DILATION AND CURETTAGE OF UTERUS     ECTOPIC PREGNANCY SURGERY  X 2   ESOPHAGOGASTRODUODENOSCOPY (EGD) WITH PROPOFOL N/A 04/03/2021   Procedure: ESOPHAGOGASTRODUODENOSCOPY (EGD) WITH PROPOFOL;  Surgeon: AYetta Flock MD;  Location: [facility];  Service: Gastroenterology;  Laterality: N/A;   JOINT REPLACEMENT     KNEE ARTHROSCOPY Left 11/2014   TEE WITHOUT CARDIOVERSION N/A 03/28/2021   Procedure: TRANSESOPHAGEAL ECHOCARDIOGRAM (TEE);  Surgeon: NThayer Headings MD;  [facility];; LVEF 55-60%. Normal LV Fxn & no RWMA. No LAA thrombus. Gr II Ao Plaque. Large circumscribed calcific mass (1.8 x 1.4 cm) anterior to posterior annulus.  Fixed posterior leaflet with Mod-Severe MS (Mean MVG ~14 mmHg, Peak 20 mmhg).  Mild MR   TONSILLECTOMY     TOTAL KNEE ARTHROPLASTY Right 02/18/2015   Procedure: RIGHT TOTAL KNEE ARTHROPLASTY;  Surgeon: SNetta Cedars MD;  [place] [facility]  Service: [place]opedics;  Laterality: Right;   TOTAL KNEE ARTHROPLASTY Left 08/19/2015   Procedure: LEFT TOTAL KNEE ARTHROPLASTY;  Surgeon: SNetta Cedars MD;  [place] [facility]  Service: Orthopedics;  Laterality: Left;   TRANSTHORACIC ECHOCARDIOGRAM  11/2016; 07/2017:   a) normal LV size, thickness and function. EF  55-60%. GR 1 DD.No RWMA severe mitral annular calcification, but no mitral stenosis. Mild LA dilation;; b) normal LV size and  function.  EF 65-75%.  Unable to assess diastolic function.  Aortic sclerosis with no stenosis.  Moderate mitral stenosis? (Gradient 9 mmHg) -?  Mild-mod left atrial enlargement    TRANSTHORACIC ECHOCARDIOGRAM  01/2019   EF 65 to 70%.  Normal function.  Elevated LVEDP-GR 1 DD.  Normal RV size and function.  Large calcific mass on posterior mitral leaflet mild to moderate mitral stenosis.   TRANSTHORACIC ECHOCARDIOGRAM  03/24/2021   EF 60 to 65%.  LV function with normal wall motion.  Moderate basal septal LVH.  GRII DD & Mild LA dilation.-> elevated LVEDP.  Normal RV function.  Mildly elevated PAP.  Ill-defined density measuring 3.23 x 2.1 cm region of the posterior MV leaflet along with dense MAC.  Moderate MS (mean MVG of 9 mmHg.) Normal AoV & RAP. --> Recommend TEE 2/2 concern for MV SBE.   VAGINAL HYSTERECTOMY     VIDEO BRONCHOSCOPY N/A 03/28/2021   Procedure: VIDEO BRONCHOSCOPY WITHOUT FLUORO;  Surgeon: Julian Hy, DO;  Location: [facility];  Service: Endoscopy;  Laterality: N/A;   Event Monitor 8-06/2021 (ordered to assess AFib Burden - 21 days, 2 separate monitors) Overall predominantly sinus rhythm with minimum heart rate of 55 bpm, maximum 140 bpm and average of 81 bpm. Atrial fibrillation was seen with a 1% burden, longest duration was 1 hour 5 minutes. Heart rate ranged from 53 to 117 bpm. 30 episodes of brief supraventricular tachycardia/atrial tachycardia: Fastest heart rate 156 bpm 6 beats. Longest was 14 seconds with an average heart rate of 115 bpm. 2 Short Episodes of Nonsustained Ventricular Tachycardia longest and fastest run was average 140 bpm with a high of 167 bpm for ~ 9 beats  Immunization History  Administered Date(s) Administered   Fluad Quad(high Dose 65+) 07/19/2021   Influenza Split 07/22/2012   Influenza, High Dose Seasonal PF 06/10/2019    Influenza,inj,Quad PF,6+ Mos 10/09/2014, 07/13/2015, 11/17/2016, 07/04/2017, 07/10/2018   Influenza-Unspecified 06/20/2020   Moderna Sars-Covid-2 Vaccination 11/27/2019, 12/26/2019, 06/22/2020, 12/30/2020, 06/21/2021   Pneumococcal Conjugate-13 03/23/2019   Pneumococcal Polysaccharide-23 08/22/2020   Pneumococcal-Unspecified 02/12/2010   Tdap 02/12/2010, 10/17/2016   Zoster Recombinat (Shingrix) 02/20/2021    MEDICATIONS/ALLERGIES   Current Meds  Medication Sig   acetaminophen (TYLENOL) 500 MG tablet Take 500 mg by mouth every 6 (six) hours as needed for fever.   acetaminophen-codeine (TYLENOL #3) 300-30 MG tablet Take 1-2 tablets by mouth every 8 (eight) hours as needed for moderate pain.   albuterol (VENTOLIN HFA) 108 (90 Base) MCG/ACT inhaler Inhale 2 puffs into the lungs every 6 (six) hours as needed for wheezing or shortness of breath.   atorvastatin (LIPITOR) 80 MG tablet TAKE 1 TABLET(80 MG) BY MOUTH DAILY AT 6 PM   cetirizine (ZYRTEC) 10 MG tablet Take 10 mg by mouth daily as needed for allergies.   Continuous Blood Gluc Sensor (DEXCOM G6 SENSOR) MISC 1 Device by Does not apply route as directed.   Continuous Blood Gluc Transmit (DEXCOM G6 TRANSMITTER) MISC 1 Device by Does not apply route as directed.   diltiazem (CARDIZEM CD) 360 MG 24 hr capsule Take 1 capsule (360 mg total) by mouth daily.   folic acid (FOLVITE) 1 MG tablet Take 2 tablets (2 mg total) by mouth daily.   glucose blood (CONTOUR NEXT TEST) test strip 3x daily   Insulin Disposable Pump (OMNIPOD DASH PODS, GEN 4,) MISC Inject 1 Dose into the skin continuous. Using pump - depends on carb intake.   insulin lispro (HUMALOG) 100  UNIT/ML injection Max Daily 100 units per pump   Insulin Pen Needle (B-D UF III MINI PEN NEEDLES) 31G X 5 MM MISC USE DAILY WITH VICTOZA AND LANTUS.   Insulin Pen Needle 31G X 5 MM MISC 1 Device by Does not apply route 3 (three) times daily.   Lancets 30G MISC 1 Device by Does not apply route  3 (three) times daily.   losartan (COZAAR) 50 MG tablet Take 1 tablet (50 mg total) by mouth daily.   Melatonin 5 MG CHEW Chew by mouth.   Multiple Vitamin (MULTIVITAMIN WITH MINERALS) TABS tablet Take 1 tablet by mouth daily.   pantoprazole (PROTONIX) 40 MG tablet Take 1 tablet (40 mg total) by mouth 2 (two) times daily before a meal.   SAFETY-LOK TB SYRINGE 27GX.5" 27G X 1/2" 1 ML MISC    traZODone (DESYREL) 50 MG tablet TAKE 0.5-1 TABLETS BY MOUTH AT BEDTIME AS NEEDED FOR SLEEP.   VICTOZA 18 MG/3ML SOPN INJECT 1.8 MG UNDER THE SKIN ONCE DAILY    Allergies  Allergen Reactions   Penicillins Hives    Childhood allergy Has patient had a PCN reaction causing immediate rash, facial/tongue/throat swelling, SOB or lightheadedness with hypotension: Yes Has patient had a PCN reaction causing severe rash involving mucus membranes or skin necrosis: No Has patient had a PCN reaction that required hospitalization No Has patient had a PCN reaction occurring within the last 10 years: No If all of the above answers are "NO", then may proceed with Cephalosporin use.   Metformin And Related Other (See Comments)    Diarrhea, bleeding   Farxiga [Dapagliflozin] Other (See Comments)    Dizzy and lethargic     SOCIAL HISTORY/FAMILY HISTORY   Reviewed in Epic:  Pertinent findings:  Social History   Tobacco Use   Smoking status: Former    Packs/day: 0.25    Years: 44.00    Pack years: 11.00    Types: Cigarettes    Quit date: 02/17/2015    Years since quitting: 6.4   Smokeless tobacco: Never  Vaping Use   Vaping Use: Never used  Substance Use Topics   Alcohol use: Not Currently    Comment: 07/03/2017 "might have a few drinks/year"   Drug use: No   Social History   Social History Narrative   Not on file    OBJCTIVE -PE, EKG, labs   Wt Readings from Last 3 Encounters:  07/31/21 255 lb (115.7 kg)  07/19/21 253 lb 6.4 oz (114.9 kg)  07/07/21 255 lb (115.7 kg)    Physical Exam: BP  126/64 (BP Location: Right Arm, Patient Position: Sitting, Cuff Size: Large)   Pulse 77   Ht _0  (1.6 m)   Wt 255 lb (115.7 kg)   LMP  (LMP Unknown)   SpO2 99%   BMI 45.17 kg/m  Physical Exam Vitals reviewed.  Constitutional:      Appearance: She is not ill-appearing or toxic-appearing.     Comments: Morbidly obese.  Well-groomed.  No obvious distress.  HENT:     Head: Normocephalic and atraumatic.  Neck:     Vascular: No carotid bruit or JVD (Really difficult assess).  Cardiovascular:     Rate and Rhythm: Normal rate and regular rhythm. Occasional Extrasystoles are present.    Pulses: Intact distal pulses. No decreased pulses (Unable to palpate).     Heart sounds: S1 normal and S2 normal. Heart sounds are distant. Murmur heard.  Low-pitched rumbling crescendo presystolic murmur is present  with a grade of 1/4 at the apex.    No friction rub. No gallop.  Pulmonary:     Effort: Pulmonary effort is normal.     Breath sounds: No wheezing, rhonchi or rales.     Comments: Notably improved work of breathing.  Distant breath sounds but no rales or rhonchi.  Mild expiratory wheeze. Chest:     Chest wall: Tenderness present.  Musculoskeletal:        General: No swelling. Normal range of motion.     Cervical back: Normal range of motion and neck supple.  Neurological:     General: No focal deficit present.     Mental Status: She is oriented to person, place, and time.  Psychiatric:        Mood and Affect: Mood normal.        Behavior: Behavior normal.        Thought Content: Thought content normal.        Judgment: Judgment normal.    Adult ECG Report  Rate: 77 ;  Rhythm: normal sinus rhythm and low voltage.  Anterior Mikan age-indeterminate. ;   Narrative Interpretation: Relatively stable  Recent Labs: Labs ordered today Lab Results  Component Value Date   CHOL 97 08/22/2020   HDL 38.80 (L) 08/22/2020   LDLCALC 44 08/22/2020   LDLDIRECT 131 (H) 09/02/2013   TRIG 74.0  08/22/2020   CHOLHDL 3 08/22/2020   Lab Results  Component Value Date   CREATININE WILL FOLLOW 07/31/2021   BUN WILL FOLLOW 07/31/2021   NA WILL FOLLOW 07/31/2021   K WILL FOLLOW 07/31/2021   CL WILL FOLLOW 07/31/2021   CO2 WILL FOLLOW 07/31/2021   CBC Latest Ref Rng & Units 07/31/2021 05/18/2021 04/05/2021  WBC 3.4 - 10.8 x10E3/uL 9.4 15.8(H) 17.8(H)  Hemoglobin 11.1 - 15.9 g/dL 9.3(L) 9.3(L) 9.5(L)  Hematocrit 34.0 - 46.6 % 30.5(L) 30.6(L) 31.6(L)  Platelets 150 - 450 x10E3/uL 346 306 336.0    Lab Results  Component Value Date   HGBA1C 8.3 (A) 06/09/2021   Lab Results  Component Value Date   TSH 4.01 03/20/2021    ==================================================  COVID-19 Education: The signs and symptoms of COVID-19 were discussed with the patient and how to seek care for testing (follow up with PCP or arrange E-visit).    I spent a total of 58 minutes with the patient spent in direct patient consultation.  Additional time spent with chart review  / charting (studies, outside notes, etc): 34 min -> Chart reviewed, images reviewed with Dr. Sallyanne [name]. Total Time: 92 min  Current medicines are reviewed at length with the patient today.  (+/- concerns) n/a  This visit occurred during the SARS-CoV-2 public health emergency.  Safety protocols were in place, including screening questions prior to the visit, additional usage of staff PPE, and extensive cleaning of exam room while observing appropriate contact time as indicated for disinfecting solutions.  Notice: This dictation was prepared with Dragon dictation along with smart phrase technology. Any transcriptional errors that result from this process are unintentional and may not be corrected upon review.  Patient Instructions / Medication Changes & Studies & Tests Ordered   Patient Instructions  Medication Instructions:  NO CHANGES *If you need a refill on your cardiac medications before your next appointment, please  call your pharmacy*   Lab Work: BMP CBC If you have labs (blood work) drawn today and your tests are completely normal, you will receive your results only by: [facility] (if  you have MyChart) OR A paper copy in the mail If you have any lab test that is abnormal or we need to change your treatment, we will call you to review the results.   Testing/Procedures: Will be schedule at 08/08/21  [facility]  Cardiac Cath Lab  Your physician has requested that you have a cardiac catheterization. Cardiac catheterization is used to diagnose and/or treat various heart conditions. Doctors may recommend this procedure for a number of different reasons. The most common reason is to evaluate chest pain. Chest pain can be a symptom of coronary artery disease (CAD), and cardiac catheterization can show whether plaque is narrowing or blocking your heart's arteries. This procedure is also used to evaluate the valves, as well as measure the blood flow and oxygen levels in different parts of your heart. For further information please visit [email or web address]. Please follow instruction sheet, as given.    Follow-Up: At [facility], you and your health needs are our priority.  As part of our continuing mission to provide you with exceptional heart care, we have created designated Provider Care Teams.  These Care Teams include your primary Cardiologist (physician) and Advanced Practice Providers (APPs -  Physician Assistants and Nurse Practitioners) who all work together to provide you with the care you need, when you need it.  We recommend signing up for the patient portal called "MyChart".  Sign up information is provided on this After Visit Summary.  MyChart is used to connect with patients for Virtual Visits (Telemedicine).  Patients are able to view lab/test results, encounter notes, upcoming appointments, etc.  Non-urgent messages can be sent to your provider as well.   To learn more about what you can do with  MyChart, go to [email or web address].    Your next appointment:   2 week(s)  The format for your next appointment:   In Person  Provider:   Glenetta Hew, MD   Other Instructions   [address] [place] [address] Dept: [phone] Loc: [phone]  Ruth Kovich  07/[birth year]20[birth year]  You are scheduled for a Cardiac Catheterization on Tuesday, October 25 with Dr. Glenetta [name].  1. Please arrive at the [facility] (Main Entrance A) at [facility]: [address] [place], [place] [address] at 10:00 AM (This time is two hours before your procedure to ensure your preparation). Free valet parking service is available.   Special note: Every effort is made to have your procedure done on time. Please understand that emergencies sometimes delay scheduled procedures.  2. Diet: Do not eat solid foods after midnight.  The patient may have clear liquids until 5am upon the day of the procedure.  3. Labs: You will need to have blood drawn _ BMP ,CBC   4. Medication instructions in preparation for your procedure:    Take insulin  pump pod off the night before.   Do not that Victoza the morning of procedure  Stop taking, Cozaar (Losartan) Tuesday, October 25,   On the morning of your procedure, take your Aspirin 81 mg morning of only and any morning medicines NOT listed above.  You may use sips of water.  5. [name] for one night stay--bring personal belongings. 6. Bring a current list of your medications and current insurance cards. 7. You MUST have a responsible person to drive you home. 8. Someone MUST be with you the first 24 hours after you arrive home or your discharge  will be delayed. 9. Please wear clothes that are easy to get on and off and wear slip-on shoes.  Thank you for allowing [place] to care for you!   -- [place] [facility]     Studies Ordered:   Orders Placed This Encounter  Procedures   Basic metabolic panel   CBC   EKG 12-Lead   LEFT AND RIGHT HEART CATHETERIZATION WITH CORONARY Illene Silver, M.D., M.S. Interventional Cardiologist   Pager # [phone] Phone # [phone] [address] [place], [place] [address]   Thank you for choosing Heartcare at [facility]!!

## 2021-07-31 NOTE — Assessment & Plan Note (Addendum)
Still multifactorial.  But with having diffuse chest pains and dyspnea, we are going to exclude CAD and evaluate the extent of her mitral valve disease with effect on pressures.  Plan: Right and Left Heart Catheterization  Shared Decision Making/Informed Consent The risks [stroke (1 in 1000), death (1 in 1000), kidney failure [usually temporary] (1 in 500), bleeding (1 in 200), allergic reaction [possibly serious] (1 in 200)], benefits (diagnostic support and management of coronary artery disease) and alternatives of a cardiac catheterization were discussed in detail with Lynn [name] and she is willing to proceed.

## 2021-07-31 NOTE — Assessment & Plan Note (Signed)
Volume status is very difficult to assess.  She does have orthopnea and PND which seems to be better.  Hard to tell how much of this is related to obesity and also her lung disease.  Plan is to proceed with right left heart catheterization and we can assess her volume status in that setting.

## 2021-07-31 NOTE — Assessment & Plan Note (Signed)
Short little bursts on monitor.  She is not really noticing much in the way of palpitations.  We increase diltiazem to 360 mg last visit.  We discussed triggers and vagal maneuvers.

## 2021-07-31 NOTE — Assessment & Plan Note (Signed)
Blood pressure well controlled current dose of diltiazem and losartan.

## 2021-07-31 NOTE — Patient Instructions (Signed)
Medication Instructions:  NO CHANGES *If you need a refill on your cardiac medications before your next appointment, please call your pharmacy*   Lab Work: BMP CBC If you have labs (blood work) drawn today and your tests are completely normal, you will receive your results only by: [place] (if you have MyChart) OR A paper copy in the mail If you have any lab test that is abnormal or we need to change your treatment, we will call you to review the results.   Testing/Procedures: Will be schedule at 08/08/21  [facility]  Cardiac Cath Lab  Your physician has requested that you have a cardiac catheterization. Cardiac catheterization is used to diagnose and/or treat various heart conditions. Doctors may recommend this procedure for a number of different reasons. The most common reason is to evaluate chest pain. Chest pain can be a symptom of coronary artery disease (CAD), and cardiac catheterization can show whether plaque is narrowing or blocking your heart's arteries. This procedure is also used to evaluate the valves, as well as measure the blood flow and oxygen levels in different parts of your heart. For further information please visit [email or web address]. Please follow instruction sheet, as given.    Follow-Up: At [facility], you and your health needs are our priority.  As part of our continuing mission to provide you with exceptional heart care, we have created designated Provider Care Teams.  These Care Teams include your primary Cardiologist (physician) and Advanced Practice Providers (APPs -  Physician Assistants and Nurse Practitioners) who all work together to provide you with the care you need, when you need it.  We recommend signing up for the patient portal called "MyChart".  Sign up information is provided on this After Visit Summary.  MyChart is used to connect with patients for Virtual Visits (Telemedicine).  Patients are able to view lab/test results, encounter notes,  upcoming appointments, etc.  Non-urgent messages can be sent to your provider as well.   To learn more about what you can do with MyChart, go to [email or web address].    Your next appointment:   2 week(s)  The format for your next appointment:   In Person  Provider:   Glenetta Hew, MD   Other Instructions   [place] [address] Dept: [phone] Loc: [phone]  Graycie Halley  07/31/2023  You are scheduled for a Cardiac Catheterization on Tuesday, October 25 with Dr. Glenetta Hew.  1. Please arrive at the [facility] (Main Entrance A) at [facility]: [address] [place], [place] [address] at 10:00 AM (This time is two hours before your procedure to ensure your preparation). Free valet parking service is available.   Special note: Every effort is made to have your procedure done on time. Please understand that emergencies sometimes delay scheduled procedures.  2. Diet: Do not eat solid foods after midnight.  The patient may have clear liquids until 5am upon the day of the procedure.  3. Labs: You will need to have blood drawn _ BMP ,CBC   4. Medication instructions in preparation for your procedure:    Take insulin  pump pod off the night before.   Do not that Victoza the morning of procedure  Stop taking, Cozaar (Losartan) Tuesday, October 25,   On the morning of your procedure, take your Aspirin 81 mg morning of only and any morning medicines NOT listed above.  You may use  sips of water.  5. Plan for one night stay--bring personal belongings. 6. Bring a current list of your medications and current insurance cards. 7. You MUST have a responsible person to drive you home. 8. Someone MUST be with you the first 24 hours after you arrive home or your discharge will be delayed. 9. Please wear clothes that are easy to get on and off  and wear slip-on shoes.  Thank you for allowing [place] to care for you!   -- [place] [facility]

## 2021-08-01 ENCOUNTER — Other Ambulatory Visit: Payer: Self-pay | Admitting: *Deleted

## 2021-08-01 DIAGNOSIS — I050 Rheumatic mitral stenosis: Secondary | ICD-10-CM

## 2021-08-01 DIAGNOSIS — R0609 Other forms of dyspnea: Secondary | ICD-10-CM

## 2021-08-01 DIAGNOSIS — I2510 Atherosclerotic heart disease of native coronary artery without angina pectoris: Secondary | ICD-10-CM

## 2021-08-01 LAB — BASIC METABOLIC PANEL
BUN/Creatinine Ratio: 12 (ref 12–28)
BUN: 13 mg/dL (ref 8–27)
CO2: 28 mmol/L (ref 20–29)
Calcium: 9.6 mg/dL (ref 8.7–10.3)
Chloride: 98 mmol/L (ref 96–106)
Creatinine, Ser: 1.06 mg/dL — ABNORMAL HIGH (ref 0.57–1.00)
Glucose: 68 mg/dL — ABNORMAL LOW (ref 70–99)
Potassium: 4.4 mmol/L (ref 3.5–5.2)
Sodium: 141 mmol/L (ref 134–144)
eGFR: 58 mL/min/{1.73_m2} — ABNORMAL LOW (ref >59)

## 2021-08-01 LAB — CBC
Hematocrit: 30.5 % — ABNORMAL LOW (ref 34.0–46.6)
Hemoglobin: 9.3 g/dL — ABNORMAL LOW (ref 11.1–15.9)
MCH: 18.8 pg — ABNORMAL LOW (ref 26.6–33.0)
MCHC: 30.5 g/dL — ABNORMAL LOW (ref 31.5–35.7)
MCV: 62 fL — ABNORMAL LOW (ref 79–97)
Platelets: 346 10*3/uL (ref 150–450)
RBC: 4.94 x10E6/uL (ref 3.77–5.28)
RDW: 15.6 % — ABNORMAL HIGH (ref 11.7–15.4)
WBC: 9.4 10*3/uL (ref 3.4–10.8)

## 2021-08-04 ENCOUNTER — Encounter (HOSPITAL_COMMUNITY): Payer: Self-pay | Admitting: Gastroenterology

## 2021-08-04 NOTE — Progress Notes (Signed)
Attempted to obtain medical history via telephone, unable to reach at this time. I left a voicemail to return pre surgical testing department's phone call.  

## 2021-08-05 ENCOUNTER — Other Ambulatory Visit: Payer: Self-pay | Admitting: Primary Care

## 2021-08-07 ENCOUNTER — Telehealth: Payer: Self-pay | Admitting: *Deleted

## 2021-08-07 NOTE — Telephone Encounter (Signed)
Cardiac catheterization scheduled at [facility] for: Tuesday August 08, 2021 [facility] Main Entrance A [facility]) at: 10 AM   No solid food after midnight prior to cath, clear liquids until 5 AM day of procedure.  Medication instructions: Hold: Insulin pump-pt will manage-will bring new pump with her. Victoza-none AM of procedure Losartan-AM of procedure-GFR 58   Except hold medications usual morning medications can be taken pre-cath with sips of water including aspirin 81 mg.    Confirmed patient has responsible adult to drive home post procedure and be with patient first 24 hours after arriving home.  [facility] does allow one visitor to accompany you and wait in the hospital waiting room while you are there for your procedure. You and your visitor will be asked to wear a mask once you enter the hospital.   Patient reports does not currently have any new symptoms concerning for COVID-19 and no household members with COVID-19 like illness.   Reviewed procedure/mask/visitor instructions with patient.  Patient does have Dexcom glucose monitor and knows it will need to be covered during catheterization.

## 2021-08-08 ENCOUNTER — Ambulatory Visit (HOSPITAL_COMMUNITY)
Admission: RE | Admit: 2021-08-08 | Discharge: 2021-08-08 | Disposition: A | Discharge status: Home / Self Care | Payer: Medicare Other | Source: Ambulatory Visit | Attending: Cardiology | Admitting: Cardiology

## 2021-08-08 ENCOUNTER — Encounter (HOSPITAL_COMMUNITY)
Admission: RE | Disposition: A | Discharge status: Home / Self Care | Payer: Self-pay | Source: Ambulatory Visit | Attending: Cardiology

## 2021-08-08 DIAGNOSIS — I5032 Chronic diastolic (congestive) heart failure: Secondary | ICD-10-CM | POA: Diagnosis not present

## 2021-08-08 DIAGNOSIS — Z880 Allergy status to penicillin: Secondary | ICD-10-CM | POA: Diagnosis not present

## 2021-08-08 DIAGNOSIS — E119 Type 2 diabetes mellitus without complications: Secondary | ICD-10-CM | POA: Diagnosis not present

## 2021-08-08 DIAGNOSIS — Z794 Long term (current) use of insulin: Secondary | ICD-10-CM | POA: Diagnosis not present

## 2021-08-08 DIAGNOSIS — J9611 Chronic respiratory failure with hypoxia: Secondary | ICD-10-CM | POA: Insufficient documentation

## 2021-08-08 DIAGNOSIS — Z87891 Personal history of nicotine dependence: Secondary | ICD-10-CM | POA: Diagnosis not present

## 2021-08-08 DIAGNOSIS — I110 Hypertensive heart disease with heart failure: Secondary | ICD-10-CM | POA: Insufficient documentation

## 2021-08-08 DIAGNOSIS — E66813 Obesity, class 3: Secondary | ICD-10-CM | POA: Diagnosis present

## 2021-08-08 DIAGNOSIS — I2510 Atherosclerotic heart disease of native coronary artery without angina pectoris: Secondary | ICD-10-CM | POA: Diagnosis present

## 2021-08-08 DIAGNOSIS — Z888 Allergy status to other drugs, medicaments and biological substances status: Secondary | ICD-10-CM | POA: Insufficient documentation

## 2021-08-08 DIAGNOSIS — R0609 Other forms of dyspnea: Secondary | ICD-10-CM

## 2021-08-08 DIAGNOSIS — E785 Hyperlipidemia, unspecified: Secondary | ICD-10-CM | POA: Diagnosis not present

## 2021-08-08 DIAGNOSIS — I050 Rheumatic mitral stenosis: Secondary | ICD-10-CM | POA: Diagnosis present

## 2021-08-08 DIAGNOSIS — Z79899 Other long term (current) drug therapy: Secondary | ICD-10-CM | POA: Insufficient documentation

## 2021-08-08 DIAGNOSIS — I272 Pulmonary hypertension, unspecified: Secondary | ICD-10-CM | POA: Diagnosis not present

## 2021-08-08 DIAGNOSIS — I480 Paroxysmal atrial fibrillation: Secondary | ICD-10-CM | POA: Diagnosis present

## 2021-08-08 DIAGNOSIS — Z7985 Long-term (current) use of injectable non-insulin antidiabetic drugs: Secondary | ICD-10-CM | POA: Insufficient documentation

## 2021-08-08 HISTORY — PX: RIGHT/LEFT HEART CATH AND CORONARY ANGIOGRAPHY: CATH118266

## 2021-08-08 LAB — POCT I-STAT EG7
Acid-Base Excess: 5 mmol/L — ABNORMAL HIGH (ref 0.0–2.0)
Acid-Base Excess: 6 mmol/L — ABNORMAL HIGH (ref 0.0–2.0)
Bicarbonate: 31.5 mmol/L — ABNORMAL HIGH (ref 20.0–28.0)
Bicarbonate: 32.8 mmol/L — ABNORMAL HIGH (ref 20.0–28.0)
Calcium, Ion: 1.24 mmol/L (ref 1.15–1.40)
Calcium, Ion: 1.28 mmol/L (ref 1.15–1.40)
HCT: 30 % — ABNORMAL LOW (ref 36.0–46.0)
HCT: 30 % — ABNORMAL LOW (ref 36.0–46.0)
Hemoglobin: 10.2 g/dL — ABNORMAL LOW (ref 12.0–15.0)
Hemoglobin: 10.2 g/dL — ABNORMAL LOW (ref 12.0–15.0)
O2 Saturation: 62 %
O2 Saturation: 63 %
Potassium: 3.9 mmol/L (ref 3.5–5.1)
Potassium: 4 mmol/L (ref 3.5–5.1)
Sodium: 139 mmol/L (ref 135–145)
Sodium: 140 mmol/L (ref 135–145)
TCO2: 33 mmol/L — ABNORMAL HIGH (ref 22–32)
TCO2: 34 mmol/L — ABNORMAL HIGH (ref 22–32)
pCO2, Ven: 55.8 mmHg (ref 44.0–60.0)
pCO2, Ven: 57.7 mmHg (ref 44.0–60.0)
pH, Ven: 7.36 (ref 7.250–7.430)
pH, Ven: 7.362 (ref 7.250–7.430)
pO2, Ven: 34 mmHg (ref 32.0–45.0)
pO2, Ven: 35 mmHg (ref 32.0–45.0)

## 2021-08-08 LAB — POCT I-STAT 7, (LYTES, BLD GAS, ICA,H+H)
Acid-Base Excess: 5 mmol/L — ABNORMAL HIGH (ref 0.0–2.0)
Bicarbonate: 31 mmol/L — ABNORMAL HIGH (ref 20.0–28.0)
Calcium, Ion: 1.24 mmol/L (ref 1.15–1.40)
HCT: 29 % — ABNORMAL LOW (ref 36.0–46.0)
Hemoglobin: 9.9 g/dL — ABNORMAL LOW (ref 12.0–15.0)
O2 Saturation: 98 %
Potassium: 3.9 mmol/L (ref 3.5–5.1)
Sodium: 139 mmol/L (ref 135–145)
TCO2: 33 mmol/L — ABNORMAL HIGH (ref 22–32)
pCO2 arterial: 52.1 mmHg — ABNORMAL HIGH (ref 32.0–48.0)
pH, Arterial: 7.383 (ref 7.350–7.450)
pO2, Arterial: 100 mmHg (ref 83.0–108.0)

## 2021-08-08 LAB — GLUCOSE, CAPILLARY
Glucose-Capillary: 182 mg/dL — ABNORMAL HIGH (ref 70–99)
Glucose-Capillary: 134 mg/dL — ABNORMAL HIGH (ref 70–99)

## 2021-08-08 SURGERY — RIGHT/LEFT HEART CATH AND CORONARY ANGIOGRAPHY
Anesthesia: LOCAL

## 2021-08-08 MED ORDER — SODIUM CHLORIDE 0.9 % IV SOLN: INTRAVENOUS | Status: AC

## 2021-08-08 MED ORDER — FENTANYL CITRATE (PF) 100 MCG/2ML IJ SOLN
INTRAMUSCULAR | Status: DC | PRN
  Administered 2021-08-08: 25 ug via INTRAVENOUS

## 2021-08-08 MED ORDER — HEPARIN SODIUM (PORCINE) 1000 UNIT/ML IJ SOLN
INTRAMUSCULAR | Status: DC | PRN
  Administered 2021-08-08: 7000 [IU] via INTRAVENOUS

## 2021-08-08 MED ORDER — ASPIRIN 81 MG PO CHEW
CHEWABLE_TABLET | ORAL | Status: AC
  Administered 2021-08-08: 81 mg
  Filled 2021-08-08: qty 1

## 2021-08-08 MED ORDER — HYDRALAZINE HCL 20 MG/ML IJ SOLN: 10.0000 mg | INTRAMUSCULAR | Status: DC | PRN

## 2021-08-08 MED ORDER — SODIUM CHLORIDE 0.9% FLUSH: 3.0000 mL | INTRAVENOUS | Status: DC | PRN

## 2021-08-08 MED ORDER — LIDOCAINE HCL (PF) 1 % IJ SOLN
INTRAMUSCULAR | Status: AC
  Filled 2021-08-08: qty 30

## 2021-08-08 MED ORDER — LABETALOL HCL 5 MG/ML IV SOLN: 10.0000 mg | INTRAVENOUS | Status: DC | PRN

## 2021-08-08 MED ORDER — ONDANSETRON HCL 4 MG/2ML IJ SOLN: 4.0000 mg | Freq: Four times a day (QID) | INTRAMUSCULAR | Status: DC | PRN

## 2021-08-08 MED ORDER — FUROSEMIDE 40 MG PO TABS: 40.0000 mg | ORAL_TABLET | Freq: Every day | ORAL | 11 refills | Status: DC

## 2021-08-08 MED ORDER — SODIUM CHLORIDE 0.9% FLUSH: 3.0000 mL | Freq: Two times a day (BID) | INTRAVENOUS | Status: DC

## 2021-08-08 MED ORDER — FUROSEMIDE 10 MG/ML IJ SOLN
INTRAMUSCULAR | Status: AC
  Filled 2021-08-08: qty 4

## 2021-08-08 MED ORDER — VERAPAMIL HCL 2.5 MG/ML IV SOLN
INTRAVENOUS | Status: AC
  Filled 2021-08-08: qty 2

## 2021-08-08 MED ORDER — LIDOCAINE HCL (PF) 1 % IJ SOLN
INTRAMUSCULAR | Status: DC | PRN
  Administered 2021-08-08: 4 mL

## 2021-08-08 MED ORDER — FUROSEMIDE 10 MG/ML IJ SOLN
INTRAMUSCULAR | Status: DC | PRN
  Administered 2021-08-08: 40 mg via INTRAVENOUS

## 2021-08-08 MED ORDER — SODIUM CHLORIDE 0.9 % WEIGHT BASED INFUSION
3.0000 mL/kg/h | INTRAVENOUS | Status: AC
  Administered 2021-08-08: 3 mL/kg/h via INTRAVENOUS

## 2021-08-08 MED ORDER — MIDAZOLAM HCL 2 MG/2ML IJ SOLN
INTRAMUSCULAR | Status: DC | PRN
  Administered 2021-08-08: 1 mg via INTRAVENOUS

## 2021-08-08 MED ORDER — IOHEXOL 350 MG/ML SOLN
INTRAVENOUS | Status: DC | PRN
  Administered 2021-08-08: 75 mL

## 2021-08-08 MED ORDER — VERAPAMIL HCL 2.5 MG/ML IV SOLN
INTRAVENOUS | Status: DC | PRN
  Administered 2021-08-08: 10 mL via INTRA_ARTERIAL

## 2021-08-08 MED ORDER — HEPARIN SODIUM (PORCINE) 1000 UNIT/ML IJ SOLN
INTRAMUSCULAR | Status: AC
  Filled 2021-08-08: qty 1

## 2021-08-08 MED ORDER — HEPARIN (PORCINE) IN NACL 1000-0.9 UT/500ML-% IV SOLN
INTRAVENOUS | Status: DC | PRN
  Administered 2021-08-08 (×2): 500 mL

## 2021-08-08 MED ORDER — SODIUM CHLORIDE 0.9 % IV SOLN: 250.0000 mL | INTRAVENOUS | Status: DC | PRN

## 2021-08-08 MED ORDER — ACETAMINOPHEN 325 MG PO TABS: 650.0000 mg | ORAL_TABLET | ORAL | Status: DC | PRN

## 2021-08-08 MED ORDER — FENTANYL CITRATE (PF) 100 MCG/2ML IJ SOLN
INTRAMUSCULAR | Status: AC
  Filled 2021-08-08: qty 2

## 2021-08-08 MED ORDER — MIDAZOLAM HCL 2 MG/2ML IJ SOLN
INTRAMUSCULAR | Status: AC
  Filled 2021-08-08: qty 2

## 2021-08-08 MED ORDER — SODIUM CHLORIDE 0.9 % WEIGHT BASED INFUSION: 1.0000 mL/kg/h | INTRAVENOUS | Status: DC

## 2021-08-08 SURGICAL SUPPLY — 15 items
CATH BALLN WEDGE 5F 110CM (CATHETERS) ×1 IMPLANT
CATH INFINITI 5FR ANG PIGTAIL (CATHETERS) ×1 IMPLANT
CATH INFINITI JR4 5F (CATHETERS) ×1 IMPLANT
CATH OPTITORQUE TIG 4.0 5F (CATHETERS) ×1 IMPLANT
DEVICE RAD COMP TR BAND LRG (VASCULAR PRODUCTS) ×1 IMPLANT
GLIDESHEATH SLEND SS 6F .021 (SHEATH) ×1 IMPLANT
GUIDEWIRE .025 260CM (WIRE) ×1 IMPLANT
GUIDEWIRE INQWIRE 1.5J.035X260 (WIRE) IMPLANT
INQWIRE 1.5J .035X260CM (WIRE) ×2
KIT HEART LEFT (KITS) ×2 IMPLANT
PACK CARDIAC CATHETERIZATION (CUSTOM PROCEDURE TRAY) ×2 IMPLANT
SHEATH GLIDE SLENDER 4/5FR (SHEATH) ×1 IMPLANT
SHEATH PROBE COVER 6X72 (BAG) ×1 IMPLANT
TRANSDUCER W/STOPCOCK (MISCELLANEOUS) ×2 IMPLANT
TUBING CIL FLEX 10 FLL-RA (TUBING) ×2 IMPLANT

## 2021-08-08 NOTE — Discharge Instructions (Signed)
Radial Site Care  This sheet gives you information about how to care for yourself after your procedure. Your health care provider may also give you more specific instructions. If you have problems or questions, contact your health care provider. What can I expect after the procedure? After the procedure, it is common to have: Bruising and tenderness at the catheter insertion area. Follow these instructions at home: Medicines Take over-the-counter and prescription medicines only as told by your health care provider. Insertion site care Follow instructions from your health care provider about how to take care of your insertion site. Make sure you: Wash your hands with soap and water before you remove your bandage (dressing). If soap and water are not available, use hand sanitizer. May remove dressing in 24 hours. Check your insertion site every day for signs of infection. Check for: Redness, swelling, or pain. Fluid or blood. Pus or a bad smell. Warmth. Do no take baths, swim, or use a hot tub for 5 days. You may shower 24-48 hours after the procedure. Remove the dressing and gently wash the site with plain soap and water. Pat the area dry with a clean towel. Do not rub the site. That could cause bleeding. Do not apply powder or lotion to the site. Activity  For 24 hours after the procedure, or as directed by your health care provider: Do not flex or bend the affected arm. Do not push or pull heavy objects with the affected arm. Do not drive yourself home from the hospital or clinic. You may drive 24 hours after the procedure. Do not operate machinery or power tools. KEEP ARM ELEVATED THE REMAINDER OF THE DAY. Do not push, pull or lift anything that is heavier than 10 lb for 5 days. Ask your health care provider when it is okay to: Return to work or school. Resume usual physical activities or sports. Resume sexual activity. General instructions If the catheter site starts to  bleed, raise your arm and put firm pressure on the site. If the bleeding does not stop, get help right away. This is a medical emergency. DRINK PLENTY OF FLUIDS FOR THE NEXT 2-3 DAYS. No alcohol consumption for 24 hours after receiving sedation. If you went home on the same day as your procedure, a responsible adult should be with you for the first 24 hours after you arrive home. Keep all follow-up visits as told by your health care provider. This is important. Contact a health care provider if: You have a fever. You have redness, swelling, or yellow drainage around your insertion site. Get help right away if: You have unusual pain at the radial site. The catheter insertion area swells very fast. The insertion area is bleeding, and the bleeding does not stop when you hold steady pressure on the area. Your arm or hand becomes pale, cool, tingly, or numb. These symptoms may represent a serious problem that is an emergency. Do not wait to see if the symptoms will go away. Get medical help right away. Call your local emergency services (911 in the [place] Do not drive yourself to the hospital. Summary After the procedure, it is common to have bruising and tenderness at the site. Follow instructions from your health care provider about how to take care of your radial site wound. Check the wound every day for signs of infection.  This information is not intended to replace advice given to you by your health care provider. Make sure you discuss any questions you have with   your health care provider. Document Revised: 11/06/2017 Document Reviewed: 11/06/2017 Elsevier Patient Education  2020 [employer]  

## 2021-08-08 NOTE — Interval H&P Note (Signed)
History and Physical Interval Note:  08/08/2021 1:11 PM  Lynn [name]  has presented today for surgery, with the diagnosis of CAD.  The various methods of treatment have been discussed with the patient and family. After consideration of risks, benefits and other options for treatment, the patient has consented to  Procedure(s): RIGHT/LEFT HEART CATH AND CORONARY ANGIOGRAPHY (N/A)  PERCUTANEOUS CORONARY INTERVENTION   as a surgical intervention.  The patient's history has been reviewed, patient examined, no change in status, stable for surgery.  I have reviewed the patient's chart and labs.  Questions were answered to the patient's satisfaction.     Glenetta Hew

## 2021-08-09 ENCOUNTER — Ambulatory Visit: Payer: Medicare Other | Admitting: Internal Medicine

## 2021-08-09 ENCOUNTER — Encounter (HOSPITAL_COMMUNITY): Payer: Self-pay | Admitting: Cardiology

## 2021-08-09 DIAGNOSIS — R0683 Snoring: Secondary | ICD-10-CM | POA: Diagnosis not present

## 2021-08-09 DIAGNOSIS — G4730 Sleep apnea, unspecified: Secondary | ICD-10-CM | POA: Diagnosis not present

## 2021-08-13 NOTE — H&P (View-Only) (Signed)
Primary Care Provider: [name], MD Cardiologist: Glenetta Hew, MD Endocrinologist: Kelton Pillar, Melanie Crazier, MD Consulting Pulmonologist: Dr. Chase Caller GI: Dr. Fuller Plan Neurosurgery: Dr. Duffy Rhody  Clinic Note: Chief Complaint  Patient presents with   Hospitalization Follow-up    Post cath   Mitral Stenosis    ===================================  ASSESSMENT/PLAN   Problem List Items Addressed This Visit       Cardiology Problems   Essential hypertension (Chronic)    Her blood pressure is little higher than it had been, but she tells me that at home she has had some low pressures and has had dizziness.  As such, we will continue simply with the current regimen.  Low threshold to go back up to 100 mg losartan.  We have added furosemide for volume control based on elevated LVEDP of the Cath Lab.      Relevant Medications   apixaban (ELIQUIS) 5 MG TABS tablet   Coronary artery disease, non-occlusive (Chronic)    I reviewed cardiac catheterization films with the patient and her husband.  Really minimal disease, less than 30% in most vessels.  There may be a component of coronary spasm from previous evaluations, but she is on high-dose diltiazem.  Would simply continue statin and calcium channel blocker (chosen over beta-blocker because of potential for spasm).      Relevant Medications   apixaban (ELIQUIS) 5 MG TABS tablet   PAT (paroxysmal atrial tachycardia) (HCC) (Chronic)    Multiple short bursts noted on monitor.  These seem to be well controlled with the increased dose of diltiazem, but I wonder if this could be related to her short-lived dizzy spells.      Relevant Medications   apixaban (ELIQUIS) 5 MG TABS tablet   PAF (paroxysmal atrial fibrillation) ([place]); CHA2DS2-VASc score 7 (Chronic)    Very low burden seen on event monitor.  As such, we are continuing to hold off on initiating NOAC.  She has EGD planned for tomorrow.  Vitamin has no  evidence of active bleeding, I suspect that we can probably start her on DOAC at that time based on results.  Would asked that her GI physician let us know.  Continue with current dose of diltiazem and hold off on antiarrhythmics unless she has recurrence of A. fib that is symptomatic.  With her diastolic dysfunction, and mitral stenosis, she would be symptomatic with A. fib if it is prolonged.  She was actually able to tell when she was in A. fib while in the hospital.  This patients CHA2DS2-VASc Score and unadjusted Ischemic Stroke Rate (% per year) is equal to 11.2 % stroke rate/year from a score of 7  Above score calculated as 1 point each if present [CHF, HTN, DM, Vascular=MI/PAD/Aortic Plaque, Age if 65-74, or Female] Above score calculated as 2 points each if present [Age > 75, or Stroke/TIA/TE]        Relevant Medications   apixaban (ELIQUIS) 5 MG TABS tablet   Hyperlipidemia associated with type 2 diabetes mellitus (HCC) (Chronic)    Well-controlled labs on high-dose statin.  Followed by her endocrinologist for diabetes      Relevant Medications   apixaban (ELIQUIS) 5 MG TABS tablet   Chronic diastolic CHF (congestive heart failure) (HCC) (Chronic)    Cardiac catheterization showed that the LVEDP was actually elevated for the PCWP.  Therefore is probably more related to hypertension.  Pulmonary pressures were not that high and not likely contributing to dyspnea.  I did start  her on a diuretic and she will be on 40 mg furosemide daily and PRN dosing for worsening edema or weight gain more than 3 pounds based on sliding scale.      Relevant Medications   apixaban (ELIQUIS) 5 MG TABS tablet   Moderate mitral stenosis by prior echocardiography - Primary (Chronic)    TTE and TEE suggests moderate to severe stenosis, however gradients are now that significant in the Cath Lab with no real change in gradient between LVEDP and PCWP.  At the most 4-7 mmHg depending on which reading was  used.  Now that her rate is better, I suspect the gradients were not as high.  We therefore can continue with high-dose diltiazem, and will have a low threshold for urgent cardioversion where she to go back into A. Fib.  We will plan to follow-up echocardiogram in about 6 months from her most recent study to ensure there is no worsening acutely and then probably annually.  Not yet at a stage where she required surgery based on right heart cath findings.      Relevant Medications   apixaban (ELIQUIS) 5 MG TABS tablet     Other   Occult GI bleeding    Pending EGD evaluation tomorrow.  If there is no evidence of bleeding, anticipate starting DOAC.   Have provided prescription for Eliquis 5 mg twice daily.      Morbid obesity due to excess calories (HCC) (Chronic)    The patient understands the need to lose weight with diet and exercise. We have discussed specific strategies for this.  Unfortunately, her exercise level is limited by her musculoskeletal pains and dyspnea.  Dietary modifications will prove to be very important.     ===================================  HPI:    Lynn [name] is a 65 y.o. female with a PMH below who presents today for 2-week/post cath follow-up  Notable PMH: Mitral Stenosis with Severe MAC-with echocardiogram and MRI noting posterior leaflet mass that is simply calcification. 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  Finally weaned off of steroids.  Unfortunate no longer on MTX,  means that her arthritis pains are worse. => Diltiazem dose was increased to 360 mg and losartan reduced to 50 mg. FUO worked up with echo showing possible mitral valve mass-TEE followed by cardiac MRI confirming that it is dense calcification on the posterior mitral valve apparatus. Hospitalization complicated by A. fib.  DOAC not started because of GI bleed during hospitalization and concern for possible cerebral AVMs. 08/01/2019: In better spirits.  Still having exertional dyspnea worse with exertion and intermittent chest pain.  Lots of musculoskeletal pains.  This was getting worse with weaning off of the steroids. -  Very sedentatry. Not Eating well, bloated.  Resting & exertional dyspnea.   Palpitations controlled with increased diltiazem.  Planned R&LHC for more physiologic evaluation of MS & Chest Pain  Recent Hospitalizations:  [facility]  Follow-up virtual visit with neurosurgery 07/10/2021: Unfortunately, the note does not explain recommendations.  Has upcoming EGD scheduled for November 1.  Reviewed  CV studies:    The following studies were reviewed today: (if available, images/films reviewed:  From Epic Chart or Care Everywhere) [facility]-[place]: Ost LCx 40%. Ost-Prox RCA 35%. Prox-Mid LAD 25%-<25% D2. Mod-Severely Elevated LVEDP. Mild PA HTN -> PA mean 31 mmHg with a PCWP and LVEDP of 23 mmHg Given Rx for furosemide 40 mg daily   Interval History:   Danah Reinecke returns here today for post cath follow-up actually doing pretty well.  She still has off-and-on episodes of chest pain most notably last night and this morning lasting about 5 to 10 minutes.  Not associate with any exertion.  She also notes having intermittent dizzy spells lasting a minute or 2 that usually followed by headache.  It seems like it may be positional, but can also be just sitting down.  He is not really sure if she is feeling palpitations during the spells, but does occasionally feel palpitations as  well..  She still has exertional dyspnea but is notably deconditioned.  She does note that her energy level is pretty significantly reduced.  She has diffuse body aches and pains.  However she denies real PND, orthopnea or edema.  She does acknowledge some weight loss and improved breathing with the Lasix.  CV Review of Symptoms (Summary) Cardiovascular ROS: positive for - chest pain, dyspnea on exertion, irregular heartbeat, orthopnea, palpitations, shortness of breath, and diffuse body aches & pains that limit activity.. negative for - paroxysmal nocturnal dyspnea, rapid heart rate, or syncope/near syncope or TIA/amaurosis fugax, claudication no real true edema.  She is accompanied by her husband today.  He had multiple additional questions & clarifications that were discussed. We reviewed Cath images & RHC numbers.  We also discussed sliding scale Lasix.  REVIEWED OF SYSTEMS   Review of Systems  Constitutional:  Positive for malaise/fatigue. Negative for weight loss (She actually put back on weight that she had lost while in the hospital.).  HENT:  Negative for congestion.   Respiratory:  Positive for cough (Improving), shortness of breath and wheezing.        Resting dyspnea is getting better, but still very fatigued and has persistent exertional dyspnea.  Cardiovascular:        Per HPI  Gastrointestinal:  Negative for blood in stool, diarrhea and melena.  Genitourinary:  Negative for hematuria.  Musculoskeletal:  Positive for back pain, joint pain and myalgias. Negative for falls.  Neurological:  Positive for dizziness, tingling, weakness (Generalized) and headaches. Negative for loss of consciousness.  Psychiatric/Behavioral:  Negative for depression and memory loss. The patient is nervous/anxious. The patient does not have insomnia.    I have reviewed and (if needed) personally updated the patient's problem list, medications, allergies, past medical and surgical history, social and  family history.   PAST MEDICAL HISTORY   Past Medical History:  Diagnosis Date   Anxiety    Arthritis    "knees, wrists, back, elbows" (07/03/2017)   Clotting disorder ([place])    blood clot in eye 2016   Coronary artery disease, non-occlusive    a. 11/2016 NSTEMI/Cath: LM nl, LAD 40p, 40m, D1/2 small, LCX 40ost, OM2/3 nl/small, RCA 35 ost/mid, RPDA/RPL small/nl.   Coronary vasospasm (HCC)    Depression    Diastolic dysfunction    a. 11/2016 Echo: EF 55-60%, gr1 DD, sev calcified MV annulus, mildly dil LA.   Eye hemorrhage 01/2015   "right; resolved" (07/03/2017)   Headache    "monthly" (07/03/2017)   History of blood transfusion    "when I had an ectopic pregnancy"   History of stomach ulcers  Hyperlipidemia    Hypertension    Microcytic anemia    Moderate mitral stenosis by prior echocardiogram 03/2021   TTE: Mod MS (mean gradient 9 mmHg). -- TEE Moderate-Severe MS (mean gradient 14 mmHg) with fixed Post MV Leaflet & Severe MAC w/ large circumscribed calcified mass on the Post MV Leaflet. (Mass previously described in 2018) - CMRI identifies calcified mass & not Tumor.; R&LHC - LVEDP & PCWP both 23 mmHg indicating minimal resting gradient   OSA on CPAP    setting is unknown   PAF (paroxysmal atrial fibrillation) ([place]) 03/2021   Event Monitor Aug-Sept 2022: Predominantly sinus rhythm.  1% A. fib burden.  30 brief episodes of PAT.  2 short bursts of NSVT.   PAT (paroxysmal atrial tachycardia) ([place])    Event monitor from August 2022 showed 30 brief bursts longest 14 seconds.   Pneumonia    "several times" (07/03/2017)   PVC's (premature ventricular contractions)    Syncope 07/03/2017 X 2   called seizures but no medications   Thalassemia    "my cells are sickle cell shaped but I don't have sickle cell anemia" (07/03/2017)   Type II diabetes mellitus ([place])     PAST SURGICAL HISTORY   Past Surgical History:  Procedure Laterality Date   48-Hour Monitor  03/18/2017   Sinus  rhythm with sinus tachycardia (rate 58-134 BPM)multiple PVCs noted with couplets and bigeminy. One triplet. 7 runs of PAT ranging from 100-130 bpm. Longest was 33 beats.   BIOPSY  04/03/2021   Procedure: BIOPSY;  Surgeon: Yetta Flock, MD;  Location: [facility];  Service: Gastroenterology;;   BREAST BIOPSY Left    BRONCHIAL WASHINGS  03/28/2021   Procedure: BRONCHIAL WASHINGS;  Surgeon: Julian Hy, DO;  Location: [facility];  Service: Endoscopy;;   CARDIAC CATHETERIZATION N/A 11/19/2016   Procedure: Left Heart Cath and Coronary Angiography;  Surgeon: Belva Crome, MD;  Location: [facility];  Service: Cardiovascular.    LM nl, LAD 40p, 74m, D1/2 small, LCX 40ost, OM2/3 nl/small, RCA 35 ost/mid, RPDA/RPL small/nl.   CARDIAC MRI  03/30/2021   Normal LVEF ~53%. 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 No Scar or Late Gadolinium Enhancement on LV Myocardium.   CARPAL TUNNEL RELEASE Right 11/01/2014   COLONOSCOPY     DILATION AND CURETTAGE OF UTERUS     ECTOPIC PREGNANCY SURGERY  X 2   ESOPHAGOGASTRODUODENOSCOPY (EGD) WITH PROPOFOL N/A 04/03/2021   Procedure: ESOPHAGOGASTRODUODENOSCOPY (EGD) WITH PROPOFOL;  Surgeon: AYetta Flock MD;  Location: [facility];  Service: Gastroenterology;  Laterality: N/A;   JOINT REPLACEMENT     KNEE ARTHROSCOPY Left 11/2014   RIGHT/LEFT HEART CATH AND CORONARY ANGIOGRAPHY N/A 08/08/2021   Procedure: RIGHT/LEFT HEART CATH AND CORONARY ANGIOGRAPHY;  Surgeon: HLeonie Man MD;  [facility];  Service: Cardiovascular;Ost LCx 40%. Ost-Prox RCA 35%. Prox-Mid LAD 25%-<25% D2. Mod-Severely Elevated LVEDP. Mild PA HTN -> PA mean 31 mmHg with a PCWP and LVEDP of 23 mmHg   TEE WITHOUT CARDIOVERSION N/A 03/28/2021   Procedure: TRANSESOPHAGEAL ECHOCARDIOGRAM (TEE);  Surgeon: NThayer Headings MD;  [facility];; LVEF 55-60%. Normal LV Fxn & no RWMA. No LAA thrombus. Gr II Ao  Plaque. Large circumscribed calcific mass (1.8 x 1.4 cm) anterior to posterior annulus.  Fixed posterior leaflet with Mod-Severe MS (Mean MVG ~14 mmHg, Peak 20 mmhg).  Mild MR   TONSILLECTOMY     TOTAL  KNEE ARTHROPLASTY Right 02/18/2015   Procedure: RIGHT TOTAL KNEE ARTHROPLASTY;  Surgeon: Netta Cedars, MD;  Location: [facility];  Service: Orthopedics;  Laterality: Right;   TOTAL KNEE ARTHROPLASTY Left 08/19/2015   Procedure: LEFT TOTAL KNEE ARTHROPLASTY;  Surgeon: Netta Cedars, MD;  Location: [facility];  Service: Orthopedics;  Laterality: Left;   TRANSTHORACIC ECHOCARDIOGRAM  11/2016; 07/2017:   a) normal LV size, thickness and function. EF 55-60%. GR 1 DD.No RWMA severe mitral annular calcification, but no mitral stenosis. Mild LA dilation;; b) normal LV size and function.  EF 65-75%.  Unable to assess diastolic function.  Aortic sclerosis with no stenosis.  Moderate mitral stenosis? (Gradient 9 mmHg) -?  Mild-mod left atrial enlargement    TRANSTHORACIC ECHOCARDIOGRAM  01/2019   EF 65 to 70%.  Normal function.  Elevated LVEDP-GR 1 DD.  Normal RV size and function.  Large calcific mass on posterior mitral leaflet mild to moderate mitral stenosis.   TRANSTHORACIC ECHOCARDIOGRAM  03/24/2021   EF 60 to 65%.  LV function with normal wall motion.  Moderate basal septal LVH.  GRII DD & Mild LA dilation.-> elevated LVEDP.  Normal RV function.  Mildly elevated PAP.  Ill-defined density measuring 3.23 x 2.1 cm region of the posterior MV leaflet along with dense MAC.  Moderate MS (mean MVG of 9 mmHg.) Normal AoV & RAP. --> Recommend TEE 2/2 concern for MV SBE.   VAGINAL HYSTERECTOMY     VIDEO BRONCHOSCOPY N/A 03/28/2021   Procedure: VIDEO BRONCHOSCOPY WITHOUT FLUORO;  Surgeon: Julian Hy, DO;  Location: [facility];  Service: Endoscopy;  Laterality: N/A;   Event Monitor 8-06/2021 (ordered to assess AFib Burden - 21 days, 2 separate monitors) Overall predominantly sinus rhythm with minimum heart rate of 55 bpm,  maximum 140 bpm and average of 81 bpm. Atrial fibrillation was seen with a 1% burden, longest duration was 1 hour 5 minutes. Heart rate ranged from 53 to 117 bpm. 30 episodes of brief supraventricular tachycardia/atrial tachycardia: Fastest heart rate 156 bpm 6 beats. Longest was 14 seconds with an average heart rate of 115 bpm. 2 Short Episodes of Nonsustained Ventricular Tachycardia longest and fastest run was average 140 bpm with a high of 167 bpm for ~ 9 beats  Immunization History  Administered Date(s) Administered   Fluad Quad(high Dose 65+) 07/19/2021   Influenza Split 07/22/2012   Influenza, High Dose Seasonal PF 06/10/2019   Influenza,inj,Quad PF,6+ Mos 10/09/2014, 07/13/2015, 11/17/2016, 07/04/2017, 07/10/2018   Influenza-Unspecified 06/20/2020   Moderna Sars-Covid-2 Vaccination 11/27/2019, 12/26/2019, 06/22/2020, 12/30/2020, 06/21/2021   Pneumococcal Conjugate-13 03/23/2019   Pneumococcal Polysaccharide-23 08/22/2020   Pneumococcal-Unspecified 02/12/2010   Tdap 02/12/2010, 10/17/2016   Zoster Recombinat (Shingrix) 02/20/2021    MEDICATIONS/ALLERGIES   Current Meds  Medication Sig   acetaminophen (TYLENOL) 500 MG tablet Take 500 mg by mouth every 6 (six) hours as needed for fever.   acetaminophen-codeine (TYLENOL #3) 300-30 MG tablet Take 1-2 tablets by mouth every 8 (eight) hours as needed for moderate pain.   albuterol (VENTOLIN HFA) 108 (90 Base) MCG/ACT inhaler Inhale 2 puffs into the lungs every 6 (six) hours as needed for wheezing or shortness of breath.   apixaban (ELIQUIS) 5 MG TABS tablet Take 1 tablet (5 mg total) by mouth 2 (two) times daily.   atorvastatin (LIPITOR) 80 MG tablet TAKE 1 TABLET(80 MG) BY MOUTH DAILY AT 6 PM   cetirizine (ZYRTEC) 10 MG tablet Take 10 mg by mouth daily as needed for allergies.   Continuous  Blood Gluc Sensor (DEXCOM G6 SENSOR) MISC 1 Device by Does not apply route as directed.   Continuous Blood Gluc Transmit (DEXCOM G6 TRANSMITTER)  MISC 1 Device by Does not apply route as directed.   diltiazem (CARDIZEM CD) 360 MG 24 hr capsule Take 1 capsule (360 mg total) by mouth daily.   folic acid (FOLVITE) 1 MG tablet Take 2 tablets (2 mg total) by mouth daily.   furosemide (LASIX) 40 MG tablet Take 1 tablet (40 mg total) by mouth daily. Take additional dose as needed for egt gain> 3 lb in 1 day   glucose blood (CONTOUR NEXT TEST) test strip 3x daily   Insulin Disposable Pump (OMNIPOD DASH PODS, GEN 4,) MISC Inject 1 Dose into the skin continuous. Using pump - depends on carb intake.   insulin lispro (HUMALOG) 100 UNIT/ML injection Max Daily 100 units per pump   Insulin Pen Needle (B-D UF III MINI PEN NEEDLES) 31G X 5 MM MISC USE DAILY WITH VICTOZA AND LANTUS.   Insulin Pen Needle 31G X 5 MM MISC 1 Device by Does not apply route 3 (three) times daily.   Lancets 30G MISC 1 Device by Does not apply route 3 (three) times daily.   losartan (COZAAR) 50 MG tablet Take 1 tablet (50 mg total) by mouth daily.   Melatonin 5 MG CHEW Chew 5 mg by mouth at bedtime.   Menthol-Methyl Salicylate (SALONPAS PAIN RELIEF PATCH) PTCH Apply 1 patch topically daily as needed (pain).   Multiple Vitamin (MULTIVITAMIN WITH MINERALS) TABS tablet Take 1 tablet by mouth daily.   Oxymetazoline HCl (VICKS SINEX NA) Place 1 spray into the nose daily as needed (congestion).   pantoprazole (PROTONIX) 40 MG tablet Take 1 tablet (40 mg total) by mouth 2 (two) times daily before a meal.   SAFETY-LOK TB SYRINGE 27GX.5" 27G X 1/2" 1 ML MISC    traZODone (DESYREL) 50 MG tablet TAKE 0.5-1 TABLETS BY MOUTH AT BEDTIME AS NEEDED FOR SLEEP.   VICTOZA 18 MG/3ML SOPN INJECT 1.8 MG UNDER THE SKIN ONCE DAILY    Allergies  Allergen Reactions   Penicillins Hives    Childhood allergy Has patient had a PCN reaction causing immediate rash, facial/tongue/throat swelling, SOB or lightheadedness with hypotension: Yes Has patient had a PCN reaction causing severe rash involving mucus  membranes or skin necrosis: No Has patient had a PCN reaction that required hospitalization No Has patient had a PCN reaction occurring within the last 10 years: No If all of the above answers are "NO", then may proceed with Cephalosporin use.   Metformin And Related Other (See Comments)    Diarrhea, bleeding   Farxiga [Dapagliflozin] Other (See Comments)    Dizzy and lethargic     SOCIAL HISTORY/FAMILY HISTORY   Reviewed in Epic:  Pertinent findings:  Social History   Tobacco Use   Smoking status: Former    Packs/day: 0.25    Years: 44.00    Pack years: 11.00    Types: Cigarettes    Quit date: 02/17/2015    Years since quitting: 6.4   Smokeless tobacco: Never  Vaping Use   Vaping Use: Never used  Substance Use Topics   Alcohol use: Not Currently    Comment: 07/03/2017 "might have a few drinks/year"   Drug use: No   Social History   Social History Narrative   Not on file    OBJCTIVE -PE, EKG, labs   Wt Readings from Last 3 Encounters:  08/14/21 254  lb 9.6 oz (115.5 kg)  08/08/21 254 lb (115.2 kg)  07/31/21 255 lb (115.7 kg)    Physical Exam: BP (!) 142/74   Pulse 86   Ht _0  (1.6 m)   Wt 254 lb 9.6 oz (115.5 kg)   LMP  (LMP Unknown)   SpO2 96%   BMI 45.10 kg/m  Physical Exam Vitals reviewed.  Constitutional:      Appearance: She is not ill-appearing (Relatively healthy-appearing) or toxic-appearing.     Comments: Morbidly obese.  HENT:     Head: Normocephalic and atraumatic.  Neck:     Vascular: No carotid bruit or JVD (Really difficult assess).  Cardiovascular:     Rate and Rhythm: Normal rate and regular rhythm. Occasional Extrasystoles are present.    Pulses: Intact distal pulses. No decreased pulses (Unable to palpate).     Heart sounds: S1 normal and S2 normal. Heart sounds are distant. Murmur (Difficult to hear) heard.  Low-pitched rumbling crescendo presystolic murmur is present with a grade of 1/4 at the apex.    No friction rub. No gallop.   Pulmonary:     Effort: Pulmonary effort is normal.     Breath sounds: No wheezing, rhonchi or rales.     Comments: Notably improved work of breathing.  Distant breath sounds but no rales or rhonchi.  Mild expiratory wheeze. Chest:     Chest wall: Tenderness present.  Musculoskeletal:        General: No swelling. Normal range of motion.     Cervical back: Normal range of motion and neck supple.  Neurological:     General: No focal deficit present.     Mental Status: She is alert and oriented to person, place, and time.  Psychiatric:        Mood and Affect: Mood normal.        Behavior: Behavior normal.        Thought Content: Thought content normal.        Judgment: Judgment normal.    Adult ECG Report  Rate: 86;  Rhythm: normal sinus rhythm and low voltage.  Cannot rule out anterior MI, age-indeterminate.  ;   Narrative Interpretation: Stable  Recent Labs: Lab Results  Component Value Date   CHOL 97 08/22/2020   HDL 38.80 (L) 08/22/2020   LDLCALC 44 08/22/2020   TRIG 74.0 08/22/2020   CHOLHDL 3 08/22/2020   Lab Results  Component Value Date   CREATININE 1.06 (H) 07/31/2021   BUN 13 07/31/2021   NA 140 08/08/2021   K 3.9 08/08/2021   CL 98 07/31/2021   CO2 28 07/31/2021   CBC Latest Ref Rng & Units 08/08/2021 08/08/2021 08/08/2021  WBC 3.4 - 10.8 x10E3/uL - - -  Hemoglobin 12.0 - 15.0 g/dL 10.2(L) 10.2(L) 9.9(L)  Hematocrit 36.0 - 46.0 % 30.0(L) 30.0(L) 29.0(L)  Platelets 150 - 450 x10E3/uL - - -    Lab Results  Component Value Date   HGBA1C 8.3 (A) 06/09/2021   Lab Results  Component Value Date   TSH 4.01 03/20/2021    ==================================================  COVID-19 Education: The signs and symptoms of COVID-19 were discussed with the patient and how to seek care for testing (follow up with PCP or arrange E-visit).    I spent a total of 45 minutes with the patient spent in direct patient consultation.  Additional time spent with chart  review  / charting (studies, outside notes, etc): 30 min -> Chart reviewed, images reviewed with Dr. Sallyanne Kuster.  Total Time: 70 min  Current medicines are reviewed at length with the patient today.  (+/- concerns) n/a  This visit occurred during the SARS-CoV-2 public health emergency.  Safety protocols were in place, including screening questions prior to the visit, additional usage of staff PPE, and extensive cleaning of exam room while observing appropriate contact time as indicated for disinfecting solutions.  Notice: This dictation was prepared with Dragon dictation along with smart phrase technology. Any transcriptional errors that result from this process are unintentional and may not be corrected upon review.  Patient Instructions / Medication Changes & Studies & Tests Ordered   Patient Instructions  Medication Instructions:  Starting Eliquis 5 mg twice daily (hold this until you get the okay from GI doctor to start)  If you are not eating or drinking well, hold your lasix.  *If you need a refill on your cardiac medications before your next appointment, please call your pharmacy*   Follow-Up: At [facility], you and your health needs are our priority.  As part of our continuing mission to provide you with exceptional heart care, we have created designated Provider Care Teams.  These Care Teams include your primary Cardiologist (physician) and Advanced Practice Providers (APPs -  Physician Assistants and Nurse Practitioners) who all work together to provide you with the care you need, when you need it.  We recommend signing up for the patient portal called "MyChart".  Sign up information is provided on this After Visit Summary.  MyChart is used to connect with patients for Virtual Visits (Telemedicine).  Patients are able to view lab/test results, encounter notes, upcoming appointments, etc.  Non-urgent messages can be sent to your provider as well.   To learn more about what you can  do with MyChart, go to [email or web address].    Your next appointment:   3 month(s)  The format for your next appointment:   Virtual Visit   Provider:   Glenetta Hew, MD   And in person visit in 6 months with Dr.Talan Gildner.    Studies Ordered:   No orders of the defined types were placed in this encounter.     Glenetta Hew, M.D., M.S. Interventional Cardiologist   Pager # [phone] Phone # [phone] [address] [place], [place] [address]   Thank you for choosing [facility] at [facility]!!

## 2021-08-13 NOTE — Progress Notes (Signed)
Primary Care Provider: [name], MD Cardiologist: Glenetta Hew, MD Endocrinologist: Kelton Pillar, Melanie Crazier, MD Consulting Pulmonologist: Dr. Chase Caller GI: Dr. Fuller Plan Neurosurgery: Dr. Duffy Rhody  Clinic Note: Chief Complaint  Patient presents with   Hospitalization Follow-up    Post cath   Mitral Stenosis    ===================================  ASSESSMENT/PLAN   Problem List Items Addressed This Visit       Cardiology Problems   Essential hypertension (Chronic)    Her blood pressure is little higher than it had been, but she tells me that at home she has had some low pressures and has had dizziness.  As such, we will continue simply with the current regimen.  Low threshold to go back up to 100 mg losartan.  We have added furosemide for volume control based on elevated LVEDP of the Cath Lab.      Relevant Medications   apixaban (ELIQUIS) 5 MG TABS tablet   Coronary artery disease, non-occlusive (Chronic)    I reviewed cardiac catheterization films with the patient and her husband.  Really minimal disease, less than 30% in most vessels.  There may be a component of coronary spasm from previous evaluations, but she is on high-dose diltiazem.  Would simply continue statin and calcium channel blocker (chosen over beta-blocker because of potential for spasm).      Relevant Medications   apixaban (ELIQUIS) 5 MG TABS tablet   PAT (paroxysmal atrial tachycardia) (HCC) (Chronic)    Multiple short bursts noted on monitor.  These seem to be well controlled with the increased dose of diltiazem, but I wonder if this could be related to her short-lived dizzy spells.      Relevant Medications   apixaban (ELIQUIS) 5 MG TABS tablet   PAF (paroxysmal atrial fibrillation) ([place]); CHA2DS2-VASc score 7 (Chronic)    Very low burden seen on event monitor.  As such, we are continuing to hold off on initiating NOAC.  She has EGD planned for tomorrow.  Vitamin has no  evidence of active bleeding, I suspect that we can probably start her on DOAC at that time based on results.  Would asked that her GI physician let us know.  Continue with current dose of diltiazem and hold off on antiarrhythmics unless she has recurrence of A. fib that is symptomatic.  With her diastolic dysfunction, and mitral stenosis, she would be symptomatic with A. fib if it is prolonged.  She was actually able to tell when she was in A. fib while in the hospital.  This patients CHA2DS2-VASc Score and unadjusted Ischemic Stroke Rate (% per year) is equal to 11.2 % stroke rate/year from a score of 7  Above score calculated as 1 point each if present [CHF, HTN, DM, Vascular=MI/PAD/Aortic Plaque, Age if 65-74, or Female] Above score calculated as 2 points each if present [Age > 75, or Stroke/TIA/TE]        Relevant Medications   apixaban (ELIQUIS) 5 MG TABS tablet   Hyperlipidemia associated with type 2 diabetes mellitus (HCC) (Chronic)    Well-controlled labs on high-dose statin.  Followed by her endocrinologist for diabetes      Relevant Medications   apixaban (ELIQUIS) 5 MG TABS tablet   Chronic diastolic CHF (congestive heart failure) (HCC) (Chronic)    Cardiac catheterization showed that the LVEDP was actually elevated for the PCWP.  Therefore is probably more related to hypertension.  Pulmonary pressures were not that high and not likely contributing to dyspnea.  I did start  her on a diuretic and she will be on 40 mg furosemide daily and PRN dosing for worsening edema or weight gain more than 3 pounds based on sliding scale.      Relevant Medications   apixaban (ELIQUIS) 5 MG TABS tablet   Moderate mitral stenosis by prior echocardiography - Primary (Chronic)    TTE and TEE suggests moderate to severe stenosis, however gradients are now that significant in the Cath Lab with no real change in gradient between LVEDP and PCWP.  At the most 4-7 mmHg depending on which reading was  used.  Now that her rate is better, I suspect the gradients were not as high.  We therefore can continue with high-dose diltiazem, and will have a low threshold for urgent cardioversion where she to go back into A. Fib.  We will plan to follow-up echocardiogram in about 6 months from her most recent study to ensure there is no worsening acutely and then probably annually.  Not yet at a stage where she required surgery based on right heart cath findings.      Relevant Medications   apixaban (ELIQUIS) 5 MG TABS tablet     Other   Occult GI bleeding    Pending EGD evaluation tomorrow.  If there is no evidence of bleeding, anticipate starting DOAC.   Have provided prescription for Eliquis 5 mg twice daily.      Morbid obesity due to excess calories (HCC) (Chronic)    The patient understands the need to lose weight with diet and exercise. We have discussed specific strategies for this.  Unfortunately, her exercise level is limited by her musculoskeletal pains and dyspnea.  Dietary modifications will prove to be very important.     ===================================  HPI:    Lynn [name] is a 65 y.o. female with a PMH below who presents today for 2-week/post cath follow-up  Notable PMH: Mitral Stenosis with Severe MAC-with echocardiogram and MRI noting posterior leaflet mass that is simply calcification. 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  Finally weaned off of steroids.  Unfortunate no longer on MTX,  means that her arthritis pains are worse. => Diltiazem dose was increased to 360 mg and losartan reduced to 50 mg. FUO worked up with echo showing possible mitral valve mass-TEE followed by cardiac MRI confirming that it is dense calcification on the posterior mitral valve apparatus. Hospitalization complicated by A. fib.  DOAC not started because of GI bleed during hospitalization and concern for possible cerebral AVMs. 08/01/2019: In better spirits.  Still having exertional dyspnea worse with exertion and intermittent chest pain.  Lots of musculoskeletal pains.  This was getting worse with weaning off of the steroids. -  Very sedentatry. Not Eating well, bloated.  Resting & exertional dyspnea.   Palpitations controlled with increased diltiazem.  Planned R&[facility] for more physiologic evaluation of MS & Chest Pain  Recent Hospitalizations:  [facility]  Follow-up virtual visit with neurosurgery 07/10/2021: Unfortunately, the note does not explain recommendations.  Has upcoming EGD scheduled for November 1.  Reviewed  CV studies:    The following studies were reviewed today: (if available, images/films reviewed:  From Epic Chart or Care Everywhere) [facility]-[place]: Ost LCx 40%. Ost-Prox RCA 35%. Prox-Mid LAD 25%-<25% D2. Mod-Severely Elevated LVEDP. Mild PA HTN -> PA mean 31 mmHg with a PCWP and LVEDP of 23 mmHg Given Rx for furosemide 40 mg daily   Interval History:   Danah Reinecke returns here today for post cath follow-up actually doing pretty well.  She still has off-and-on episodes of chest pain most notably last night and this morning lasting about 5 to 10 minutes.  Not associate with any exertion.  She also notes having intermittent dizzy spells lasting a minute or 2 that usually followed by headache.  It seems like it may be positional, but can also be just sitting down.  He is not really sure if she is feeling palpitations during the spells, but does occasionally feel palpitations as  well..  She still has exertional dyspnea but is notably deconditioned.  She does note that her energy level is pretty significantly reduced.  She has diffuse body aches and pains.  However she denies real PND, orthopnea or edema.  She does acknowledge some weight loss and improved breathing with the Lasix.  CV Review of Symptoms (Summary) Cardiovascular ROS: positive for - chest pain, dyspnea on exertion, irregular heartbeat, orthopnea, palpitations, shortness of breath, and diffuse body aches & pains that limit activity.. negative for - paroxysmal nocturnal dyspnea, rapid heart rate, or syncope/near syncope or TIA/amaurosis fugax, claudication no real true edema.  She is accompanied by her husband today.  He had multiple additional questions & clarifications that were discussed. We reviewed Cath images & RHC numbers.  We also discussed sliding scale Lasix.  REVIEWED OF SYSTEMS   Review of Systems  Constitutional:  Positive for malaise/fatigue. Negative for weight loss (She actually put back on weight that she had lost while in the hospital.).  HENT:  Negative for congestion.   Respiratory:  Positive for cough (Improving), shortness of breath and wheezing.        Resting dyspnea is getting better, but still very fatigued and has persistent exertional dyspnea.  Cardiovascular:        Per HPI  Gastrointestinal:  Negative for blood in stool, diarrhea and melena.  Genitourinary:  Negative for hematuria.  Musculoskeletal:  Positive for back pain, joint pain and myalgias. Negative for falls.  Neurological:  Positive for dizziness, tingling, weakness (Generalized) and headaches. Negative for loss of consciousness.  Psychiatric/Behavioral:  Negative for depression and memory loss. The patient is nervous/anxious. The patient does not have insomnia.    I have reviewed and (if needed) personally updated the patient's problem list, medications, allergies, past medical and surgical history, social and  family history.   PAST MEDICAL HISTORY   Past Medical History:  Diagnosis Date   Anxiety    Arthritis    "knees, wrists, back, elbows" (07/03/2017)   Clotting disorder ([place])    blood clot in eye 2016   Coronary artery disease, non-occlusive    a. 11/2016 NSTEMI/Cath: LM nl, LAD 40p, 40m, D1/2 small, LCX 40ost, OM2/3 nl/small, RCA 35 ost/mid, RPDA/RPL small/nl.   Coronary vasospasm (HCC)    Depression    Diastolic dysfunction    a. 11/2016 Echo: EF 55-60%, gr1 DD, sev calcified MV annulus, mildly dil LA.   Eye hemorrhage 01/2015   "right; resolved" (07/03/2017)   Headache    "monthly" (07/03/2017)   History of blood transfusion    "when I had an ectopic pregnancy"   History of stomach ulcers  Hyperlipidemia    Hypertension    Microcytic anemia    Moderate mitral stenosis by prior echocardiogram 03/2021   TTE: Mod MS (mean gradient 9 mmHg). -- TEE Moderate-Severe MS (mean gradient 14 mmHg) with fixed Post MV Leaflet & Severe MAC w/ large circumscribed calcified mass on the Post MV Leaflet. (Mass previously described in 2018) - CMRI identifies calcified mass & not Tumor.; R&L[facility] - LVEDP & PCWP both 23 mmHg indicating minimal resting gradient   OSA on CPAP    setting is unknown   PAF (paroxysmal atrial fibrillation) (HCC) 03/2021   Event Monitor Aug-Sept 2022: Predominantly sinus rhythm.  1% A. fib burden.  30 brief episodes of PAT.  2 short bursts of NSVT.   PAT (paroxysmal atrial tachycardia) (HCC)    Event monitor from August 2022 showed 30 brief bursts longest 14 seconds.   Pneumonia    "several times" (07/03/2017)   PVC's (premature ventricular contractions)    Syncope 07/03/2017 X 2   called seizures but no medications   Thalassemia    "my cells are sickle cell shaped but I don't have sickle cell anemia" (07/03/2017)   Type II diabetes mellitus (HCC)     PAST SURGICAL HISTORY   Past Surgical History:  Procedure Laterality Date   48-Hour Monitor  03/18/2017   Sinus  rhythm with sinus tachycardia (rate 58-134 BPM)multiple PVCs noted with couplets and bigeminy. One triplet. 7 runs of PAT ranging from 100-130 bpm. Longest was 33 beats.   BIOPSY  04/03/2021   Procedure: BIOPSY;  Surgeon: Armbruster, Steven P, MD;  Location: [facility];  Service: Gastroenterology;;   BREAST BIOPSY Left    BRONCHIAL WASHINGS  03/28/2021   Procedure: BRONCHIAL WASHINGS;  Surgeon: Clark, Laura P, DO;  Location: [facility];  Service: Endoscopy;;   CARDIAC CATHETERIZATION N/A 11/19/2016   Procedure: Left Heart Cath and Coronary Angiography;  Surgeon: Henry W Smith, MD;  Location: [facility];  Service: Cardiovascular.    LM nl, LAD 40p, 50md, D1/2 small, LCX 40ost, OM2/3 nl/small, RCA 35 ost/mid, RPDA/RPL small/nl.   CARDIAC MRI  03/30/2021   Normal LVEF ~53%. 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 No Scar or Late Gadolinium Enhancement on LV Myocardium.   CARPAL TUNNEL RELEASE Right 11/01/2014   COLONOSCOPY     DILATION AND CURETTAGE OF UTERUS     ECTOPIC PREGNANCY SURGERY  X 2   ESOPHAGOGASTRODUODENOSCOPY (EGD) WITH PROPOFOL N/A 04/03/2021   Procedure: ESOPHAGOGASTRODUODENOSCOPY (EGD) WITH PROPOFOL;  Surgeon: Armbruster, Steven P, MD;  Location: [facility];  Service: Gastroenterology;  Laterality: N/A;   JOINT REPLACEMENT     KNEE ARTHROSCOPY Left 11/2014   RIGHT/LEFT HEART CATH AND CORONARY ANGIOGRAPHY N/A 08/08/2021   Procedure: RIGHT/LEFT HEART CATH AND CORONARY ANGIOGRAPHY;  Surgeon: Shivangi Lutz W, MD;  Location: [facility];  Service: Cardiovascular;Ost LCx 40%. Ost-Prox RCA 35%. Prox-Mid LAD 25%-<25% D2. Mod-Severely Elevated LVEDP. Mild PA HTN -> PA mean 31 mmHg with a PCWP and LVEDP of 23 mmHg   TEE WITHOUT CARDIOVERSION N/A 03/28/2021   Procedure: TRANSESOPHAGEAL ECHOCARDIOGRAM (TEE);  Surgeon: Nahser, Philip J, MD;  Location: [facility];; LVEF 55-60%. Normal LV Fxn & no RWMA. No LAA thrombus. Gr II Ao  Plaque. Large circumscribed calcific mass (1.8 x 1.4 cm) anterior to posterior annulus.  Fixed posterior leaflet with Mod-Severe MS (Mean MVG ~14 mmHg, Peak 20 mmhg).  Mild MR   TONSILLECTOMY     TOTAL   KNEE ARTHROPLASTY Right 02/18/2015   Procedure: RIGHT TOTAL KNEE ARTHROPLASTY;  Surgeon: Netta Cedars, MD;  Location: [place];  Service: Orthopedics;  Laterality: Right;   TOTAL KNEE ARTHROPLASTY Left 08/19/2015   Procedure: LEFT TOTAL KNEE ARTHROPLASTY;  Surgeon: Netta Cedars, MD;  Location: [place];  Service: Orthopedics;  Laterality: Left;   TRANSTHORACIC ECHOCARDIOGRAM  11/2016; 07/2017:   a) normal LV size, thickness and function. EF 55-60%. GR 1 DD.No RWMA severe mitral annular calcification, but no mitral stenosis. Mild LA dilation;; b) normal LV size and function.  EF 65-75%.  Unable to assess diastolic function.  Aortic sclerosis with no stenosis.  Moderate mitral stenosis? (Gradient 9 mmHg) -?  Mild-mod left atrial enlargement    TRANSTHORACIC ECHOCARDIOGRAM  01/2019   EF 65 to 70%.  Normal function.  Elevated LVEDP-GR 1 DD.  Normal RV size and function.  Large calcific mass on posterior mitral leaflet mild to moderate mitral stenosis.   TRANSTHORACIC ECHOCARDIOGRAM  03/24/2021   EF 60 to 65%.  LV function with normal wall motion.  Moderate basal septal LVH.  GRII DD & Mild LA dilation.-> elevated LVEDP.  Normal RV function.  Mildly elevated PAP.  Ill-defined density measuring 3.23 x 2.1 cm region of the posterior MV leaflet along with dense MAC.  Moderate MS (mean MVG of 9 mmHg.) Normal AoV & RAP. --> Recommend TEE 2/2 concern for MV SBE.   VAGINAL HYSTERECTOMY     VIDEO BRONCHOSCOPY N/A 03/28/2021   Procedure: VIDEO BRONCHOSCOPY WITHOUT FLUORO;  Surgeon: Julian Hy, DO;  Location: [facility];  Service: Endoscopy;  Laterality: N/A;   Event Monitor 8-06/2021 (ordered to assess AFib Burden - 21 days, 2 separate monitors) Overall predominantly sinus rhythm with minimum heart rate of 55 bpm,  maximum 140 bpm and average of 81 bpm. Atrial fibrillation was seen with a 1% burden, longest duration was 1 hour 5 minutes. Heart rate ranged from 53 to 117 bpm. 30 episodes of brief supraventricular tachycardia/atrial tachycardia: Fastest heart rate 156 bpm 6 beats. Longest was 14 seconds with an average heart rate of 115 bpm. 2 Short Episodes of Nonsustained Ventricular Tachycardia longest and fastest run was average 140 bpm with a high of 167 bpm for ~ 9 beats  Immunization History  Administered Date(s) Administered   Fluad Quad(high Dose 65+) 07/19/2021   Influenza Split 07/22/2012   Influenza, High Dose Seasonal PF 06/10/2019   Influenza,inj,Quad PF,6+ Mos 10/09/2014, 07/13/2015, 11/17/2016, 07/04/2017, 07/10/2018   Influenza-Unspecified 06/20/2020   Moderna Sars-Covid-2 Vaccination 11/27/2019, 12/26/2019, 06/22/2020, 12/30/2020, 06/21/2021   Pneumococcal Conjugate-13 03/23/2019   Pneumococcal Polysaccharide-23 08/22/2020   Pneumococcal-Unspecified 02/12/2010   Tdap 02/12/2010, 10/17/2016   Zoster Recombinat (Shingrix) 02/20/2021    MEDICATIONS/ALLERGIES   Current Meds  Medication Sig   acetaminophen (TYLENOL) 500 MG tablet Take 500 mg by mouth every 6 (six) hours as needed for fever.   acetaminophen-codeine (TYLENOL #3) 300-30 MG tablet Take 1-2 tablets by mouth every 8 (eight) hours as needed for moderate pain.   albuterol (VENTOLIN HFA) 108 (90 Base) MCG/ACT inhaler Inhale 2 puffs into the lungs every 6 (six) hours as needed for wheezing or shortness of breath.   apixaban (ELIQUIS) 5 MG TABS tablet Take 1 tablet (5 mg total) by mouth 2 (two) times daily.   atorvastatin (LIPITOR) 80 MG tablet TAKE 1 TABLET(80 MG) BY MOUTH DAILY AT 6 PM   cetirizine (ZYRTEC) 10 MG tablet Take 10 mg by mouth daily as needed for allergies.   Continuous  Blood Gluc Sensor (DEXCOM G6 SENSOR) MISC 1 Device by Does not apply route as directed.   Continuous Blood Gluc Transmit (DEXCOM G6 TRANSMITTER)  MISC 1 Device by Does not apply route as directed.   diltiazem (CARDIZEM CD) 360 MG 24 hr capsule Take 1 capsule (360 mg total) by mouth daily.   folic acid (FOLVITE) 1 MG tablet Take 2 tablets (2 mg total) by mouth daily.   furosemide (LASIX) 40 MG tablet Take 1 tablet (40 mg total) by mouth daily. Take additional dose as needed for egt gain> 3 lb in 1 day   glucose blood (CONTOUR NEXT TEST) test strip 3x daily   Insulin Disposable Pump (OMNIPOD DASH PODS, GEN 4,) MISC Inject 1 Dose into the skin continuous. Using pump - depends on carb intake.   insulin lispro (HUMALOG) 100 UNIT/ML injection Max Daily 100 units per pump   Insulin Pen Needle (B-D UF III MINI PEN NEEDLES) 31G X 5 MM MISC USE DAILY WITH VICTOZA AND LANTUS.   Insulin Pen Needle 31G X 5 MM MISC 1 Device by Does not apply route 3 (three) times daily.   Lancets 30G MISC 1 Device by Does not apply route 3 (three) times daily.   losartan (COZAAR) 50 MG tablet Take 1 tablet (50 mg total) by mouth daily.   Melatonin 5 MG CHEW Chew 5 mg by mouth at bedtime.   Menthol-Methyl Salicylate (SALONPAS PAIN RELIEF PATCH) PTCH Apply 1 patch topically daily as needed (pain).   Multiple Vitamin (MULTIVITAMIN WITH MINERALS) TABS tablet Take 1 tablet by mouth daily.   Oxymetazoline HCl (VICKS SINEX NA) Place 1 spray into the nose daily as needed (congestion).   pantoprazole (PROTONIX) 40 MG tablet Take 1 tablet (40 mg total) by mouth 2 (two) times daily before a meal.   SAFETY-LOK TB SYRINGE 27GX.5" 27G X 1/2" 1 ML MISC    traZODone (DESYREL) 50 MG tablet TAKE 0.5-1 TABLETS BY MOUTH AT BEDTIME AS NEEDED FOR SLEEP.   VICTOZA 18 MG/3ML SOPN INJECT 1.8 MG UNDER THE SKIN ONCE DAILY    Allergies  Allergen Reactions   Penicillins Hives    Childhood allergy Has patient had a PCN reaction causing immediate rash, facial/tongue/throat swelling, SOB or lightheadedness with hypotension: Yes Has patient had a PCN reaction causing severe rash involving mucus  membranes or skin necrosis: No Has patient had a PCN reaction that required hospitalization No Has patient had a PCN reaction occurring within the last 10 years: No If all of the above answers are "NO", then may proceed with Cephalosporin use.   Metformin And Related Other (See Comments)    Diarrhea, bleeding   Farxiga [Dapagliflozin] Other (See Comments)    Dizzy and lethargic     SOCIAL HISTORY/FAMILY HISTORY   Reviewed in Epic:  Pertinent findings:  Social History   Tobacco Use   Smoking status: Former    Packs/day: 0.25    Years: 44.00    Pack years: 11.00    Types: Cigarettes    Quit date: 02/17/2015    Years since quitting: 6.4   Smokeless tobacco: Never  Vaping Use   Vaping Use: Never used  Substance Use Topics   Alcohol use: Not Currently    Comment: 07/03/2017 "might have a few drinks/year"   Drug use: No   Social History   Social History Narrative   Not on file    OBJCTIVE -PE, EKG, labs   Wt Readings from Last 3 Encounters:  08/14/21 254  lb 9.6 oz (115.5 kg)  08/08/21 254 lb (115.2 kg)  07/31/21 255 lb (115.7 kg)    Physical Exam: BP (!) 142/74   Pulse 86   Ht _0  (1.6 m)   Wt 254 lb 9.6 oz (115.5 kg)   LMP  (LMP Unknown)   SpO2 96%   BMI 45.10 kg/m  Physical Exam Vitals reviewed.  Constitutional:      Appearance: She is not ill-appearing (Relatively healthy-appearing) or toxic-appearing.     Comments: Morbidly obese.  HENT:     Head: Normocephalic and atraumatic.  Neck:     Vascular: No carotid bruit or JVD (Really difficult assess).  Cardiovascular:     Rate and Rhythm: Normal rate and regular rhythm. Occasional Extrasystoles are present.    Pulses: Intact distal pulses. No decreased pulses (Unable to palpate).     Heart sounds: S1 normal and S2 normal. Heart sounds are distant. Murmur (Difficult to hear) heard.  Low-pitched rumbling crescendo presystolic murmur is present with a grade of 1/4 at the apex.    No friction rub. No gallop.   Pulmonary:     Effort: Pulmonary effort is normal.     Breath sounds: No wheezing, rhonchi or rales.     Comments: Notably improved work of breathing.  Distant breath sounds but no rales or rhonchi.  Mild expiratory wheeze. Chest:     Chest wall: Tenderness present.  Musculoskeletal:        General: No swelling. Normal range of motion.     Cervical back: Normal range of motion and neck supple.  Neurological:     General: No focal deficit present.     Mental Status: She is alert and oriented to person, place, and time.  Psychiatric:        Mood and Affect: Mood normal.        Behavior: Behavior normal.        Thought Content: Thought content normal.        Judgment: Judgment normal.    Adult ECG Report  Rate: 86;  Rhythm: normal sinus rhythm and low voltage.  Cannot rule out anterior MI, age-indeterminate.  ;   Narrative Interpretation: Stable  Recent Labs: Lab Results  Component Value Date   CHOL 97 08/22/2020   HDL 38.80 (L) 08/22/2020   LDLCALC 44 08/22/2020   TRIG 74.0 08/22/2020   CHOLHDL 3 08/22/2020   Lab Results  Component Value Date   CREATININE 1.06 (H) 07/31/2021   BUN 13 07/31/2021   NA 140 08/08/2021   K 3.9 08/08/2021   CL 98 07/31/2021   CO2 28 07/31/2021   CBC Latest Ref Rng & Units 08/08/2021 08/08/2021 08/08/2021  WBC 3.4 - 10.8 x10E3/uL - - -  Hemoglobin 12.0 - 15.0 g/dL 10.2(L) 10.2(L) 9.9(L)  Hematocrit 36.0 - 46.0 % 30.0(L) 30.0(L) 29.0(L)  Platelets 150 - 450 x10E3/uL - - -    Lab Results  Component Value Date   HGBA1C 8.3 (A) 06/09/2021   Lab Results  Component Value Date   TSH 4.01 03/20/2021    ==================================================  COVID-19 Education: The signs and symptoms of COVID-19 were discussed with the patient and how to seek care for testing (follow up with PCP or arrange E-visit).    I spent a total of 45 minutes with the patient spent in direct patient consultation.  Additional time spent with chart  review  / charting (studies, outside notes, etc): 30 min -> Chart reviewed, images reviewed with Dr. Sallyanne Kuster.  Total Time: 70 min  Current medicines are reviewed at length with the patient today.  (+/- concerns) n/a  This visit occurred during the SARS-CoV-2 public health emergency.  Safety protocols were in place, including screening questions prior to the visit, additional usage of staff PPE, and extensive cleaning of exam room while observing appropriate contact time as indicated for disinfecting solutions.  Notice: This dictation was prepared with Dragon dictation along with smart phrase technology. Any transcriptional errors that result from this process are unintentional and may not be corrected upon review.  Patient Instructions / Medication Changes & Studies & Tests Ordered   Patient Instructions  Medication Instructions:  Starting Eliquis 5 mg twice daily (hold this until you get the okay from GI doctor to start)  If you are not eating or drinking well, hold your lasix.  *If you need a refill on your cardiac medications before your next appointment, please call your pharmacy*   Follow-Up: At [facility], you and your health needs are our priority.  As part of our continuing mission to provide you with exceptional heart care, we have created designated Provider Care Teams.  These Care Teams include your primary Cardiologist (physician) and Advanced Practice Providers (APPs -  Physician Assistants and Nurse Practitioners) who all work together to provide you with the care you need, when you need it.  We recommend signing up for the patient portal called "MyChart".  Sign up information is provided on this After Visit Summary.  MyChart is used to connect with patients for Virtual Visits (Telemedicine).  Patients are able to view lab/test results, encounter notes, upcoming appointments, etc.  Non-urgent messages can be sent to your provider as well.   To learn more about what you can  do with MyChart, go to [email or web address].    Your next appointment:   3 month(s)  The format for your next appointment:   Virtual Visit   Provider:   Glenetta Hew, MD   And in person visit in 6 months with Dr.Jackie Russman.    Studies Ordered:   No orders of the defined types were placed in this encounter.     Glenetta Hew, M.D., M.S. Interventional Cardiologist   Pager # [phone] Phone # [phone] [address] [place], [place] [address]   Thank you for choosing Heartcare at [facility]!!

## 2021-08-14 ENCOUNTER — Encounter: Payer: Self-pay | Admitting: Cardiology

## 2021-08-14 ENCOUNTER — Ambulatory Visit: Payer: Medicare Other | Admitting: Cardiology

## 2021-08-14 ENCOUNTER — Other Ambulatory Visit: Payer: Self-pay

## 2021-08-14 VITALS — BP 142/74 | HR 86 | Ht 63.0 in | Wt 254.6 lb

## 2021-08-14 DIAGNOSIS — E1169 Type 2 diabetes mellitus with other specified complication: Secondary | ICD-10-CM

## 2021-08-14 DIAGNOSIS — I471 Supraventricular tachycardia: Secondary | ICD-10-CM

## 2021-08-14 DIAGNOSIS — I050 Rheumatic mitral stenosis: Secondary | ICD-10-CM | POA: Diagnosis not present

## 2021-08-14 DIAGNOSIS — I480 Paroxysmal atrial fibrillation: Secondary | ICD-10-CM | POA: Diagnosis not present

## 2021-08-14 DIAGNOSIS — I5032 Chronic diastolic (congestive) heart failure: Secondary | ICD-10-CM

## 2021-08-14 DIAGNOSIS — E785 Hyperlipidemia, unspecified: Secondary | ICD-10-CM | POA: Diagnosis not present

## 2021-08-14 DIAGNOSIS — R195 Other fecal abnormalities: Secondary | ICD-10-CM

## 2021-08-14 DIAGNOSIS — I2510 Atherosclerotic heart disease of native coronary artery without angina pectoris: Secondary | ICD-10-CM

## 2021-08-14 DIAGNOSIS — I10 Essential (primary) hypertension: Secondary | ICD-10-CM

## 2021-08-14 MED ORDER — APIXABAN 5 MG PO TABS: 5.0000 mg | ORAL_TABLET | Freq: Two times a day (BID) | ORAL | 2 refills | Status: DC

## 2021-08-14 NOTE — Patient Instructions (Signed)
Medication Instructions:  Starting Eliquis 5 mg twice daily (hold this until you get the okay from GI doctor to start)  If you are not eating or drinking well, hold your lasix.  *If you need a refill on your cardiac medications before your next appointment, please call your pharmacy*   Follow-Up: At [facility], you and your health needs are our priority.  As part of our continuing mission to provide you with exceptional heart care, we have created designated Provider Care Teams.  These Care Teams include your primary Cardiologist (physician) and Advanced Practice Providers (APPs -  Physician Assistants and Nurse Practitioners) who all work together to provide you with the care you need, when you need it.  We recommend signing up for the patient portal called "MyChart".  Sign up information is provided on this After Visit Summary.  MyChart is used to connect with patients for Virtual Visits (Telemedicine).  Patients are able to view lab/test results, encounter notes, upcoming appointments, etc.  Non-urgent messages can be sent to your provider as well.   To learn more about what you can do with MyChart, go to [email or web address].    Your next appointment:   3 month(s)  The format for your next appointment:   Virtual Visit   Provider:   Glenetta Hew, MD   And in person visit in 6 months with Dr.Harding.

## 2021-08-14 NOTE — Assessment & Plan Note (Signed)
Cardiac catheterization showed that the LVEDP was actually elevated for the PCWP.  Therefore is probably more related to hypertension.  Pulmonary pressures were not that high and not likely contributing to dyspnea.  I did start her on a diuretic and she will be on 40 mg furosemide daily and PRN dosing for worsening edema or weight gain more than 3 pounds based on sliding scale.

## 2021-08-14 NOTE — Assessment & Plan Note (Signed)
Multiple short bursts noted on monitor.  These seem to be well controlled with the increased dose of diltiazem, but I wonder if this could be related to her short-lived dizzy spells.

## 2021-08-14 NOTE — Assessment & Plan Note (Signed)
I reviewed cardiac catheterization films with the patient and her husband.  Really minimal disease, less than 30% in most vessels.  There may be a component of coronary spasm from previous evaluations, but she is on high-dose diltiazem.  Would simply continue statin and calcium channel blocker (chosen over beta-blocker because of potential for spasm).

## 2021-08-14 NOTE — Assessment & Plan Note (Signed)
Very low burden seen on event monitor.  As such, we are continuing to hold off on initiating NOAC.  She has EGD planned for tomorrow.  Vitamin has no evidence of active bleeding, I suspect that we can probably start her on DOAC at that time based on results.  Would asked that her GI physician let us know.  Continue with current dose of diltiazem and hold off on antiarrhythmics unless she has recurrence of A. fib that is symptomatic.  With her diastolic dysfunction, and mitral stenosis, she would be symptomatic with A. fib if it is prolonged.  She was actually able to tell when she was in A. fib while in the hospital.  This patients CHA2DS2-VASc Score and unadjusted Ischemic Stroke Rate (% per year) is equal to 11.2 % stroke rate/year from a score of 7  Above score calculated as 1 point each if present [CHF, HTN, DM, Vascular=MI/PAD/Aortic Plaque, Age if 65-74, or Female] Above score calculated as 2 points each if present [Age > 75, or Stroke/TIA/TE]

## 2021-08-14 NOTE — Assessment & Plan Note (Signed)
Pending EGD evaluation tomorrow.  If there is no evidence of bleeding, anticipate starting DOAC.   Have provided prescription for Eliquis 5 mg twice daily.

## 2021-08-14 NOTE — Assessment & Plan Note (Signed)
Her blood pressure is little higher than it had been, but she tells me that at home she has had some low pressures and has had dizziness.  As such, we will continue simply with the current regimen.  Low threshold to go back up to 100 mg losartan.  We have added furosemide for volume control based on elevated LVEDP of the Cath Lab.

## 2021-08-14 NOTE — Assessment & Plan Note (Signed)
TTE and TEE suggests moderate to severe stenosis, however gradients are now that significant in the Cath Lab with no real change in gradient between LVEDP and PCWP.  At the most 4-7 mmHg depending on which reading was used.  Now that her rate is better, I suspect the gradients were not as high.  We therefore can continue with high-dose diltiazem, and will have a low threshold for urgent cardioversion where she to go back into A. Fib.  We will plan to follow-up echocardiogram in about 6 months from her most recent study to ensure there is no worsening acutely and then probably annually.  Not yet at a stage where she required surgery based on right heart cath findings.

## 2021-08-14 NOTE — Assessment & Plan Note (Signed)
Well-controlled labs on high-dose statin.  Followed by her endocrinologist for diabetes

## 2021-08-14 NOTE — Assessment & Plan Note (Signed)
The patient understands the need to lose weight with diet and exercise. We have discussed specific strategies for this.  Unfortunately, her exercise level is limited by her musculoskeletal pains and dyspnea.  Dietary modifications will prove to be very important.

## 2021-08-14 NOTE — Anesthesia Preprocedure Evaluation (Addendum)
Anesthesia Evaluation  Patient identified by MRN, date of birth, ID band Patient awake    Reviewed: Allergy & Precautions, NPO status , Patient's Chart, lab work & pertinent test results  Airway Mallampati: III  TM Distance: >3 FB Neck ROM: Full  Mouth opening: Limited Mouth Opening  Dental  (+) Missing, Dental Advisory Given,    Pulmonary sleep apnea and Continuous Positive Airway Pressure Ventilation , COPD,  oxygen dependent, former smoker,    Pulmonary exam normal breath sounds clear to auscultation       Cardiovascular hypertension, Pt. on medications + CAD, + Past MI and +CHF  Normal cardiovascular exam+ dysrhythmias Atrial Fibrillation + Valvular Problems/Murmurs (mod/severe MS)  Rhythm:Regular Rate:Normal  TEE 2022 1. Left ventricular ejection fraction, by estimation, is 55 to 60%. The  left ventricle has normal function. The left ventricle has no regional  wall motion abnormalities.  2. Right ventricular systolic function was not well visualized. The right  ventricular size is not well visualized.  3. No left atrial/left atrial appendage thrombus was detected.  4. 3-D images of the mitral annular mass were obtained. There is a large  circumscribed mass adherent to the posterior annulus. The posterior  leaflet is fixed . There is moderate - severe mitral stenosis.   The mass is large , measuring 1.8 x 1.4 cm. Differential diagnosis  includes myxoma, exuberant mitral annular calcification, myxoma, or other  primary cardiac tumor. suggest Cardiac MRI for further evaluation . This  mass was present on transthoracic  echo from 2018. . The mitral valve is abnormal. Mild mitral valve  regurgitation. Moderate to severe mitral stenosis. Severe mitral annular  calcification.  5. The aortic valve is tricuspid. Aortic valve regurgitation is not  visualized. No aortic stenosis is present.  6. There is mild (Grade II)  plaque.   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 Known moderate to severe mitral stenosis without significant PCWP elevation. Cardiac output-index 6.43-3.0.  Borderline.    Neuro/Psych  Headaches, PSYCHIATRIC DISORDERS Anxiety Depression    GI/Hepatic Neg liver ROS, PUD,   Endo/Other  diabetes, Type 2, Insulin DependentMorbid obesity (BMI 45)  Renal/GU Renal InsufficiencyRenal diseaseLab Results      Component                Value               Date                      CREATININE               1.06 (H)            07/31/2021                BUN                      13                  07/31/2021                NA                       140                 08/08/2021  K                        3.9                 08/08/2021                CL                       98                  07/31/2021                CO2                      28                  07/31/2021             negative genitourinary   Musculoskeletal  (+) Arthritis , Rheumatoid disorders,    Abdominal   Peds  Hematology  (+) Blood dyscrasia (Hgb 10.2, on eliquis), anemia ,   Anesthesia Other Findings   Reproductive/Obstetrics                           Anesthesia Physical Anesthesia Plan  ASA: 4  Anesthesia Plan: MAC   Post-op Pain Management:    Induction: Intravenous  PONV Risk Score and Plan: Propofol infusion and Treatment may vary due to age or medical condition  Airway Management Planned: Natural Airway  Additional Equipment:   Intra-op Plan:   Post-operative Plan:   Informed Consent: I have reviewed the patients History and Physical, chart, labs and discussed the procedure including the risks, benefits and alternatives for the proposed anesthesia with the patient or authorized representative who has indicated his/her understanding  and acceptance.     Dental advisory given  Plan Discussed with: CRNA  Anesthesia Plan Comments:        Anesthesia Quick Evaluation

## 2021-08-15 ENCOUNTER — Ambulatory Visit (HOSPITAL_COMMUNITY)
Admission: RE | Admit: 2021-08-15 | Discharge: 2021-08-15 | Disposition: A | Discharge status: Home / Self Care | Payer: Medicare Other | Source: Ambulatory Visit | Attending: Gastroenterology | Admitting: Gastroenterology

## 2021-08-15 ENCOUNTER — Encounter (HOSPITAL_COMMUNITY)
Admission: RE | Disposition: A | Discharge status: Home / Self Care | Payer: Self-pay | Source: Ambulatory Visit | Attending: Gastroenterology

## 2021-08-15 ENCOUNTER — Ambulatory Visit (HOSPITAL_COMMUNITY): Payer: Medicare Other | Admitting: Anesthesiology

## 2021-08-15 ENCOUNTER — Other Ambulatory Visit: Payer: Self-pay

## 2021-08-15 ENCOUNTER — Encounter (HOSPITAL_COMMUNITY): Payer: Self-pay | Admitting: Gastroenterology

## 2021-08-15 DIAGNOSIS — K449 Diaphragmatic hernia without obstruction or gangrene: Secondary | ICD-10-CM | POA: Insufficient documentation

## 2021-08-15 DIAGNOSIS — Z6841 Body Mass Index (BMI) 40.0 and over, adult: Secondary | ICD-10-CM | POA: Insufficient documentation

## 2021-08-15 DIAGNOSIS — K253 Acute gastric ulcer without hemorrhage or perforation: Secondary | ICD-10-CM

## 2021-08-15 DIAGNOSIS — K2210 Ulcer of esophagus without bleeding: Secondary | ICD-10-CM

## 2021-08-15 DIAGNOSIS — K3189 Other diseases of stomach and duodenum: Secondary | ICD-10-CM

## 2021-08-15 DIAGNOSIS — I480 Paroxysmal atrial fibrillation: Secondary | ICD-10-CM | POA: Diagnosis not present

## 2021-08-15 DIAGNOSIS — K2100 Gastro-esophageal reflux disease with esophagitis, without bleeding: Secondary | ICD-10-CM

## 2021-08-15 DIAGNOSIS — Z87891 Personal history of nicotine dependence: Secondary | ICD-10-CM | POA: Diagnosis not present

## 2021-08-15 DIAGNOSIS — Z79899 Other long term (current) drug therapy: Secondary | ICD-10-CM | POA: Insufficient documentation

## 2021-08-15 HISTORY — PX: ESOPHAGOGASTRODUODENOSCOPY (EGD) WITH PROPOFOL: SHX5813

## 2021-08-15 SURGERY — ESOPHAGOGASTRODUODENOSCOPY (EGD) WITH PROPOFOL
Anesthesia: Monitor Anesthesia Care

## 2021-08-15 MED ORDER — PROPOFOL 500 MG/50ML IV EMUL
INTRAVENOUS | Status: DC | PRN
  Administered 2021-08-15: 150 ug/kg/min via INTRAVENOUS

## 2021-08-15 MED ORDER — LACTATED RINGERS IV SOLN
INTRAVENOUS | Status: DC
  Administered 2021-08-15: 1000 mL via INTRAVENOUS

## 2021-08-15 MED ORDER — SODIUM CHLORIDE 0.9 % IV SOLN: INTRAVENOUS | Status: DC

## 2021-08-15 MED ORDER — LIDOCAINE 2% (20 MG/ML) 5 ML SYRINGE
INTRAMUSCULAR | Status: DC | PRN
Start: 2021-08-15 — End: 2021-08-15
  Administered 2021-08-15: 100 mg via INTRAVENOUS

## 2021-08-15 MED ORDER — PROPOFOL 10 MG/ML IV BOLUS
INTRAVENOUS | Status: DC | PRN
  Administered 2021-08-15 (×2): 10 mg via INTRAVENOUS

## 2021-08-15 SURGICAL SUPPLY — 15 items

## 2021-08-15 NOTE — Anesthesia Postprocedure Evaluation (Signed)
Anesthesia Post Note  Patient: Lynn [name]  Procedure(s) Performed: ESOPHAGOGASTRODUODENOSCOPY (EGD) WITH PROPOFOL     Patient location during evaluation: Endoscopy Anesthesia Type: MAC Level of consciousness: awake and alert Pain management: pain level controlled Vital Signs Assessment: post-procedure vital signs reviewed and stable Respiratory status: spontaneous breathing, nonlabored ventilation, respiratory function stable and patient connected to nasal cannula oxygen Cardiovascular status: blood pressure returned to baseline and stable Postop Assessment: no apparent nausea or vomiting Anesthetic complications: no   No notable events documented.  Last Vitals:  Vitals:   08/15/21 1010 08/15/21 1020  BP: (!) 144/71 (!) 149/71  Pulse: 73 (!) 41  Resp: 17 17  Temp:    SpO2: 100% 96%    Last Pain:  Vitals:   08/15/21 1020  TempSrc:   PainSc: 0-No pain                 Brylinn Teaney L Keana Dueitt

## 2021-08-15 NOTE — Interval H&P Note (Signed)
History and Physical Interval Note:  08/15/2021 9:02 AM  Lynn [name]  has presented today for surgery, with the diagnosis of Gastric ulcer.  The various methods of treatment have been discussed with the patient and family. After consideration of risks, benefits and other options for treatment, the patient has consented to  Procedure(s): ESOPHAGOGASTRODUODENOSCOPY (EGD) WITH PROPOFOL (N/A) as a surgical intervention.  The patient's history has been reviewed, patient examined, no change in status, stable for surgery.  I have reviewed the patient's chart and labs.  Questions were answered to the patient's satisfaction.     Pricilla Riffle. Fuller Plan

## 2021-08-15 NOTE — Transfer of Care (Signed)
Immediate Anesthesia Transfer of Care Note  Patient: Lynn [name]  Procedure(s) Performed: ESOPHAGOGASTRODUODENOSCOPY (EGD) WITH PROPOFOL  Patient Location: Endoscopy Unit  Anesthesia Type:MAC  Level of Consciousness: awake, alert  and oriented  Airway & Oxygen Therapy: Patient Spontanous Breathing and Patient connected to face mask oxygen  Post-op Assessment: Report given to RN and Post -op Vital signs reviewed and stable  Post vital signs: Reviewed and stable  Last Vitals:  Vitals Value Taken Time  BP 136/80 08/15/21 1005  Temp    Pulse 81 08/15/21 1007  Resp 14 08/15/21 1007  SpO2 100 % 08/15/21 1007  Vitals shown include unvalidated device data.  Last Pain:  Vitals:   08/15/21 1005  TempSrc:   PainSc: 0-No pain         Complications: No notable events documented.

## 2021-08-15 NOTE — Op Note (Signed)
[facility] Patient Name: Lynn [name] Procedure Date: 08/15/2021 MRN: [id number] Attending MD: Ladene Artist , MD Date of Birth: [birth year] CSN: [id number] Age: 65 Admit Type: Outpatient Procedure:                Upper GI endoscopy Indications:              Follow-up of acute gastric ulcer and acute                            esophageal ulcer Providers:                Pricilla Riffle. Fuller Plan, MD, Jeanella Cara, RN,                            Frazier Richards, Technician, Cherylynn Ridges, Technician Referring MD:             Gay Filler. Copland, MD Medicines:                Monitored Anesthesia Care Complications:            No immediate complications. Estimated Blood Loss:     Estimated blood loss: none. Procedure:                Pre-Anesthesia Assessment:                           - Prior to the procedure, a History and Physical                            was performed, and patient medications and                            allergies were reviewed. The patient's tolerance of                            previous anesthesia was also reviewed. The risks                            and benefits of the procedure and the sedation                            options and risks were discussed with the patient.                            All questions were answered, and informed consent                            was obtained. Prior Anticoagulants: The patient has                            taken no previous anticoagulant or antiplatelet                            agents. ASA Grade Assessment: III - A patient with  severe systemic disease. After reviewing the risks                            and benefits, the patient was deemed in                            satisfactory condition to undergo the procedure.                           After obtaining informed consent, the endoscope was                            passed under direct vision. Throughout the                             procedure, the patient's blood pressure, pulse, and                            oxygen saturations were monitored continuously. The                            [id number]-[id number] ([id number]) Olympus endoscope was introduced                            through the mouth, and advanced to the second part                            of duodenum. The upper GI endoscopy was                            accomplished without difficulty. The patient                            tolerated the procedure well. Scope In: Scope Out: Findings:      The examined esophagus was normal.      A medium amount of food (residue) was found in the gastric fundus and in       the gastric body obscured 30-40% of the gastric mucosa.      A small hiatal hernia was present.      A few localized small erosions with no bleeding and no stigmata of       recent bleeding were found in the gastric antrum.      The exam of the stomach was otherwise normal.      The duodenal bulb and second portion of the duodenum were normal. Impression:               - Normal esophagus.                           - A medium amount of food (residue) in the stomach.                           - Small hiatal hernia.                           -  Mild erosive antral gastropathy with no bleeding                            and no stigmata of recent bleeding.                           - Normal duodenal bulb and second portion of the                            duodenum.                           - No specimens collected. Moderate Sedation:      Not Applicable - Patient had care per Anesthesia. Recommendation:           - Patient has a contact number available for                            emergencies. The signs and symptoms of potential                            delayed complications were discussed with the                            patient. Return to normal activities tomorrow.                            Written discharge instructions were  provided to the                            patient.                           - Resume previous diet.                           - Continue present medications including                            pantoprazole 40 mg po bid long term.                           - No aspirin, ibuprofen, naproxen, or other                            non-steroidal anti-inflammatory drugs long term if                            at all possible.                           - Gastric ulcers and esophageal ulcer have healed.                            She has mild erosive antral gastritis.                           -  OK to resume Eliquis (apixaban) at prior dose                            tomorrow. Low risk of recurrent UGI bleeding based                            on EGD findings. Refer to managing physician for                            further adjustment of therapy. Procedure Code(s):        --- Professional ---                           [phone], Esophagogastroduodenoscopy, flexible,                            transoral; diagnostic, including collection of                            specimen(s) by brushing or washing, when performed                            (separate procedure) Diagnosis Code(s):        --- Professional ---                           K44.9, Diaphragmatic hernia without obstruction or                            gangrene                           K31.89, Other diseases of stomach and duodenum                           K25.3, Acute gastric ulcer without hemorrhage or                            perforation CPT copyright 2019 [employer]. All rights reserved. The codes documented in this report are preliminary and upon coder review may  be revised to meet current compliance requirements. Ladene Artist, MD 08/15/2021 10:12:42 AM This report has been signed electronically. Number of Addenda: 0

## 2021-08-16 DIAGNOSIS — M2550 Pain in unspecified joint: Secondary | ICD-10-CM | POA: Diagnosis not present

## 2021-08-16 DIAGNOSIS — M0579 Rheumatoid arthritis with rheumatoid factor of multiple sites without organ or systems involvement: Secondary | ICD-10-CM | POA: Diagnosis not present

## 2021-08-18 ENCOUNTER — Encounter (HOSPITAL_COMMUNITY): Payer: Self-pay | Admitting: Gastroenterology

## 2021-08-18 ENCOUNTER — Telehealth: Payer: Self-pay | Admitting: Internal Medicine

## 2021-08-18 NOTE — Addendum Note (Signed)
Addended by: Hinton Dyer on: 08/18/2021 04:29 PM   Modules accepted: Orders

## 2021-08-18 NOTE — Telephone Encounter (Signed)
Lynn [name] -had overnight pulse oximetry on 08/09/2021.  She did this for 6 hours and 22 minutes and 52 seconds.  Time spent less than or equal to 88% was 9 minutes and 12 seconds  Plan - If she wants to return the oxygen she can.  It is minimum desaturation.  Technically she qualifies for nighttime oxygen 2 L at night but it is only 9 minutes and therefore if she does not want to use it that is fine

## 2021-08-20 ENCOUNTER — Encounter: Payer: Self-pay | Admitting: Family Medicine

## 2021-08-20 NOTE — Progress Notes (Deleted)
[place] at [facility] [address] [place], [place] [address] [phone]  Date:  08/23/2021   Name:  Lynn [name]   DOB:  [birth year]   MRN:  [id number]  PCP:  [name], MD    Chief Complaint: No chief complaint on file.   History of Present Illness:  Lynn [name] is a 65 y.o. very pleasant female patient who presents with the following:  Pt seen today for a periodic follow-up visit  Last seen by myself just last month   Patient Active Problem List   Diagnosis Date Noted   Acute gastric ulcer without hemorrhage or perforation    Ulcer of esophagus without bleeding    PAF (paroxysmal atrial fibrillation) ([place]); CHA2DS2-VASc score 7 07/05/2021   Anemia    Acute gastric ulcer with hemorrhage    Occult GI bleeding    Melena    Preop respiratory exam    ILD (interstitial lung disease) ([place])    SOB (shortness of breath) 03/24/2021   FUO (fever of unknown origin) 03/24/2021   Hypokalemia 03/24/2021   Generalized weakness 03/24/2021   Coronary artery spasm ([place]) 08/09/2020   Rheumatoid arthritis ([place]) 11/09/2019   Toe pain, bilateral 05/26/2019   Type 2 diabetes mellitus with hyperglycemia, with long-term current use of insulin ([place]) 05/26/2019   Mobility impaired 04/01/2019   Morbid obesity due to excess calories ([place]) 12/03/2017   DOE (dyspnea on exertion) 12/02/2017   Moderate mitral stenosis by prior echocardiography 08/23/2017   Chronic diastolic CHF (congestive heart failure) ([place]) 08/12/2017   Chronic respiratory failure with hypoxia ([place]) 08/12/2017   OSA on CPAP 08/12/2017   COPD with acute bronchitis ([place]) 08/12/2017   Near syncope 07/03/2017   Anxiety 07/03/2017   Coronary artery disease, non-occlusive 12/08/2016   PAT (paroxysmal atrial tachycardia) ([place])    H/O total knee replacement 08/19/2015   Vision, loss, sudden 03/07/2015   S/P TKR (total knee replacement) using cement  02/18/2015   Thalassemia 09/02/2013   Hyperlipidemia associated with type 2 diabetes mellitus ([place]) 08/24/2012   Essential hypertension 07/22/2012    Past Medical History:  Diagnosis Date   Anxiety    Arthritis    "knees, wrists, back, elbows" (07/03/2017)   Clotting disorder ([place])    blood clot in eye 2016   Coronary artery disease, non-occlusive    a. 11/2016 NSTEMI/Cath: LM nl, LAD 40p, 67m, D1/2 small, LCX 40ost, OM2/3 nl/small, RCA 35 ost/mid, RPDA/RPL small/nl.   Coronary vasospasm ([facility])    Depression    Diastolic dysfunction    a. 11/2016 Echo: EF 55-60%, gr1 DD, sev calcified MV annulus, mildly dil LA.   Eye hemorrhage 01/2015   "right; resolved" (07/03/2017)   Headache    "monthly" (07/03/2017)   History of blood transfusion    "when I had an ectopic pregnancy"   History of stomach ulcers    Hyperlipidemia    Hypertension    Microcytic anemia    Moderate mitral stenosis by prior echocardiogram 03/2021   TTE: Mod MS (mean gradient 9 mmHg). -- TEE Moderate-Severe MS (mean gradient 14 mmHg) with fixed Post MV Leaflet & Severe MAC w/ large circumscribed calcified mass on the Post MV Leaflet. (Mass previously described in 2018) - CMRI identifies calcified mass & not Tumor.; R&LHC - LVEDP & PCWP both 23 mmHg indicating minimal resting gradient   OSA on CPAP    setting is unknown   PAF (paroxysmal  atrial fibrillation) ([place]) 03/2021   Event Monitor Aug-Sept 2022: Predominantly sinus rhythm.  1% A. fib burden.  30 brief episodes of PAT.  2 short bursts of NSVT.   PAT (paroxysmal atrial tachycardia) ([place])    Event monitor from August 2022 showed 30 brief bursts longest 14 seconds.   Pneumonia    "several times" (07/03/2017)   PVC's (premature ventricular contractions)    Syncope 07/03/2017 X 2   called seizures but no medications   Thalassemia    "my cells are sickle cell shaped but I don't have sickle cell anemia" (07/03/2017)   Type II diabetes mellitus ([place])     Past  Surgical History:  Procedure Laterality Date   48-Hour Monitor  03/18/2017   Sinus rhythm with sinus tachycardia (rate 58-134 BPM)multiple PVCs noted with couplets and bigeminy. One triplet. 7 runs of PAT ranging from 100-130 bpm. Longest was 33 beats.   BIOPSY  04/03/2021   Procedure: BIOPSY;  Surgeon: Yetta Flock, MD;  Location: [facility];  Service: Gastroenterology;;   BREAST BIOPSY Left    BRONCHIAL WASHINGS  03/28/2021   Procedure: BRONCHIAL WASHINGS;  Surgeon: Julian Hy, DO;  Location: [facility];  Service: Endoscopy;;   CARDIAC CATHETERIZATION N/A 11/19/2016   Procedure: Left Heart Cath and Coronary Angiography;  Surgeon: Belva Crome, MD;  Location: [facility];  Service: Cardiovascular.    LM nl, LAD 40p, 64m, D1/2 small, LCX 40ost, OM2/3 nl/small, RCA 35 ost/mid, RPDA/RPL small/nl.   CARDIAC MRI  03/30/2021   Normal LVEF ~53%. 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 No Scar or Late Gadolinium Enhancement on LV Myocardium.   CARPAL TUNNEL RELEASE Right 11/01/2014   COLONOSCOPY     DILATION AND CURETTAGE OF UTERUS     ECTOPIC PREGNANCY SURGERY  X 2   ESOPHAGOGASTRODUODENOSCOPY (EGD) WITH PROPOFOL N/A 04/03/2021   Procedure: ESOPHAGOGASTRODUODENOSCOPY (EGD) WITH PROPOFOL;  Surgeon: AYetta Flock MD;  Location: [facility];  Service: Gastroenterology;  Laterality: N/A;   ESOPHAGOGASTRODUODENOSCOPY (EGD) WITH PROPOFOL N/A 08/15/2021   Procedure: ESOPHAGOGASTRODUODENOSCOPY (EGD) WITH PROPOFOL;  Surgeon: SLadene Artist MD;  Location: [facility];  Service: Endoscopy;  Laterality: N/A;   JOINT REPLACEMENT     KNEE ARTHROSCOPY Left 11/2014   RIGHT/LEFT HEART CATH AND CORONARY ANGIOGRAPHY N/A 08/08/2021   Procedure: RIGHT/LEFT HEART CATH AND CORONARY ANGIOGRAPHY;  Surgeon: HLeonie Man MD;  [facility];  Service: Cardiovascular;Ost LCx 40%. Ost-Prox RCA 35%. Prox-Mid LAD 25%-<25% D2.  Mod-Severely Elevated LVEDP. Mild PA HTN -> PA mean 31 mmHg with a PCWP and LVEDP of 23 mmHg   TEE WITHOUT CARDIOVERSION N/A 03/28/2021   Procedure: TRANSESOPHAGEAL ECHOCARDIOGRAM (TEE);  Surgeon: NThayer Headings MD;  [facility];; LVEF 55-60%. Normal LV Fxn & no RWMA. No LAA thrombus. Gr II Ao Plaque. Large circumscribed calcific mass (1.8 x 1.4 cm) anterior to posterior annulus.  Fixed posterior leaflet with Mod-Severe MS (Mean MVG ~14 mmHg, Peak 20 mmhg).  Mild MR   TONSILLECTOMY     TOTAL KNEE ARTHROPLASTY Right 02/18/2015   Procedure: RIGHT TOTAL KNEE ARTHROPLASTY;  Surgeon: SNetta Cedars MD;  [facility]  Service: Orthopedics;  Laterality: Right;   TOTAL KNEE ARTHROPLASTY Left 08/19/2015   Procedure: LEFT TOTAL KNEE ARTHROPLASTY;  Surgeon: SNetta Cedars MD;  [facility]  Service: Orthopedics;  Laterality: Left;   TRANSTHORACIC ECHOCARDIOGRAM  11/2016; 07/2017:   a) normal LV size, thickness  and function. EF 55-60%. GR 1 DD.No RWMA severe mitral annular calcification, but no mitral stenosis. Mild LA dilation;; b) normal LV size and function.  EF 65-75%.  Unable to assess diastolic function.  Aortic sclerosis with no stenosis.  Moderate mitral stenosis? (Gradient 9 mmHg) -?  Mild-mod left atrial enlargement    TRANSTHORACIC ECHOCARDIOGRAM  01/2019   EF 65 to 70%.  Normal function.  Elevated LVEDP-GR 1 DD.  Normal RV size and function.  Large calcific mass on posterior mitral leaflet mild to moderate mitral stenosis.   TRANSTHORACIC ECHOCARDIOGRAM  03/24/2021   EF 60 to 65%.  LV function with normal wall motion.  Moderate basal septal LVH.  GRII DD & Mild LA dilation.-> elevated LVEDP.  Normal RV function.  Mildly elevated PAP.  Ill-defined density measuring 3.23 x 2.1 cm region of the posterior MV leaflet along with dense MAC.  Moderate MS (mean MVG of 9 mmHg.) Normal AoV & RAP. --> Recommend TEE 2/2 concern for MV SBE.   VAGINAL HYSTERECTOMY     VIDEO BRONCHOSCOPY N/A  03/28/2021   Procedure: VIDEO BRONCHOSCOPY WITHOUT FLUORO;  Surgeon: Julian Hy, DO;  Location: [facility];  Service: Endoscopy;  Laterality: N/A;    Social History   Tobacco Use   Smoking status: Former    Packs/day: 0.25    Years: 44.00    Pack years: 11.00    Types: Cigarettes    Quit date: 02/17/2015    Years since quitting: 6.5   Smokeless tobacco: Never  Vaping Use   Vaping Use: Never used  Substance Use Topics   Alcohol use: Not Currently    Comment: 07/03/2017 "might have a few drinks/year"   Drug use: No    Family History  Problem Relation Age of Onset   Cancer Mother    Diabetes Mother    Hypertension Mother    Hyperlipidemia Mother    Cancer Father    Hyperlipidemia Father    Mental illness Sister    Diabetes Sister    Diabetes Maternal Grandmother    Diabetes Maternal Grandfather    Colon cancer Neg Hx    Esophageal cancer Neg Hx    Stomach cancer Neg Hx    Rectal cancer Neg Hx     Allergies  Allergen Reactions   Penicillins Hives    Childhood allergy Has patient had a PCN reaction causing immediate rash, facial/tongue/throat swelling, SOB or lightheadedness with hypotension: Yes Has patient had a PCN reaction causing severe rash involving mucus membranes or skin necrosis: No Has patient had a PCN reaction that required hospitalization No Has patient had a PCN reaction occurring within the last 10 years: No If all of the above answers are "NO", then may proceed with Cephalosporin use.   Metformin And Related Other (See Comments)    Diarrhea, bleeding   Farxiga [Dapagliflozin] Other (See Comments)    Dizzy and lethargic     Medication list has been reviewed and updated.  Current Outpatient Medications on File Prior to Visit  Medication Sig Dispense Refill   acetaminophen (TYLENOL) 500 MG tablet Take 500 mg by mouth every 6 (six) hours as needed for fever.     acetaminophen-codeine (TYLENOL #3) 300-30 MG tablet Take 1-2 tablets by mouth every  8 (eight) hours as needed for moderate pain. 30 tablet 1   albuterol (VENTOLIN HFA) 108 (90 Base) MCG/ACT inhaler Inhale 2 puffs into the lungs every 6 (six) hours as needed for wheezing or shortness of breath.  8 g 2   apixaban (ELIQUIS) 5 MG TABS tablet Take 1 tablet (5 mg total) by mouth 2 (two) times daily. 60 tablet 2   atorvastatin (LIPITOR) 80 MG tablet TAKE 1 TABLET(80 MG) BY MOUTH DAILY AT 6 PM 90 tablet 3   cetirizine (ZYRTEC) 10 MG tablet Take 10 mg by mouth daily as needed for allergies.     Continuous Blood Gluc Sensor (DEXCOM G6 SENSOR) MISC 1 Device by Does not apply route as directed. 3 each 6   Continuous Blood Gluc Transmit (DEXCOM G6 TRANSMITTER) MISC 1 Device by Does not apply route as directed. 1 each 3   diltiazem (CARDIZEM CD) 360 MG 24 hr capsule Take 1 capsule (360 mg total) by mouth daily. 90 capsule 3   folic acid (FOLVITE) 1 MG tablet Take 2 tablets (2 mg total) by mouth daily. 180 tablet 3   furosemide (LASIX) 40 MG tablet Take 1 tablet (40 mg total) by mouth daily. Take additional dose as needed for egt gain> 3 lb in 1 day 45 tablet 11   glucose blood (CONTOUR NEXT TEST) test strip 3x daily 300 each 11   Insulin Disposable Pump (OMNIPOD DASH PODS, GEN 4,) MISC Inject 1 Dose into the skin continuous. Using pump - depends on carb intake.     insulin glargine (LANTUS) 100 UNIT/ML injection Inject 20 Units into the skin as needed. 20 units yesterday at 730 pm     insulin lispro (HUMALOG) 100 UNIT/ML injection Max Daily 100 units per pump 90 mL 3   Insulin Pen Needle (B-D UF III MINI PEN NEEDLES) 31G X 5 MM MISC USE DAILY WITH VICTOZA AND LANTUS. 400 each 1   Insulin Pen Needle 31G X 5 MM MISC 1 Device by Does not apply route 3 (three) times daily. 150 each 11   Lancets 30G MISC 1 Device by Does not apply route 3 (three) times daily. 300 each 9   losartan (COZAAR) 50 MG tablet Take 1 tablet (50 mg total) by mouth daily. 90 tablet 3   Melatonin 5 MG CHEW Chew 5 mg by mouth at  bedtime.     Menthol-Methyl Salicylate (SALONPAS PAIN RELIEF PATCH) PTCH Apply 1 patch topically daily as needed (pain).     Multiple Vitamin (MULTIVITAMIN WITH MINERALS) TABS tablet Take 1 tablet by mouth daily.     Oxymetazoline HCl (VICKS SINEX NA) Place 1 spray into the nose daily as needed (congestion).     pantoprazole (PROTONIX) 40 MG tablet Take 1 tablet (40 mg total) by mouth 2 (two) times daily before a meal. 60 tablet 11   SAFETY-LOK TB SYRINGE 27GX.5" 27G X 1/2" 1 ML MISC      traZODone (DESYREL) 50 MG tablet TAKE 0.5-1 TABLETS BY MOUTH AT BEDTIME AS NEEDED FOR SLEEP. 90 tablet 2   VICTOZA 18 MG/3ML SOPN INJECT 1.8 MG UNDER THE SKIN ONCE DAILY 15 mL 3   No current facility-administered medications on file prior to visit.    Review of Systems:  ***  Physical Examination: There were no vitals filed for this visit. There were no vitals filed for this visit. There is no height or weight on file to calculate BMI. Ideal Body Weight:    ***  Assessment and Plan: ***  Signed Lamar Blinks, MD

## 2021-08-22 NOTE — Telephone Encounter (Signed)
Attempted to call pt's home number but the line just rings busy. Tried to call the mobile number but the VM name said Hyman Bower who is not listed on pt's DPR so did not leave VM.  Will try to call pt back later.

## 2021-08-23 ENCOUNTER — Ambulatory Visit: Payer: Medicare Other | Admitting: Family Medicine

## 2021-08-31 NOTE — Telephone Encounter (Signed)
Called the pt and there was busy signal Tried her cell- no name given and checked number x 2 so I left msg for pt to call us back  Called number listed for spouse and it rings busy

## 2021-09-01 ENCOUNTER — Encounter: Payer: Self-pay | Admitting: *Deleted

## 2021-09-01 NOTE — Telephone Encounter (Signed)
Attempted to call pt but received a busy signal. Unable to leave VM. Due to multiple times trying to contact pt and unable to do so, letter will be sent to pt and encounter will be closed.

## 2021-09-11 ENCOUNTER — Other Ambulatory Visit: Payer: Self-pay

## 2021-09-11 ENCOUNTER — Encounter: Payer: Self-pay | Admitting: Internal Medicine

## 2021-09-11 ENCOUNTER — Telehealth: Payer: Self-pay | Admitting: Internal Medicine

## 2021-09-11 ENCOUNTER — Ambulatory Visit (INDEPENDENT_AMBULATORY_CARE_PROVIDER_SITE_OTHER): Payer: Medicare Other | Admitting: Internal Medicine

## 2021-09-11 VITALS — BP 140/90 | HR 84 | Ht 63.0 in | Wt 248.0 lb

## 2021-09-11 DIAGNOSIS — E1165 Type 2 diabetes mellitus with hyperglycemia: Secondary | ICD-10-CM

## 2021-09-11 DIAGNOSIS — Z794 Long term (current) use of insulin: Secondary | ICD-10-CM

## 2021-09-11 LAB — POCT GLYCOSYLATED HEMOGLOBIN (HGB A1C): Hemoglobin A1C: 9.1 % — AB (ref 4.0–5.6)

## 2021-09-11 MED ORDER — INSULIN LISPRO 100 UNIT/ML IJ SOLN: INTRAMUSCULAR | 3 refills | Status: DC

## 2021-09-11 MED ORDER — SEMAGLUTIDE (2 MG/DOSE) 8 MG/3ML ~~LOC~~ SOPN: 2.0000 mg | PEN_INJECTOR | SUBCUTANEOUS | 3 refills | Status: DC

## 2021-09-11 MED ORDER — DEXCOM G6 SENSOR MISC
1.0000 | 6 refills | Status: DC
Start: 2021-09-11 — End: 2022-10-19

## 2021-09-11 MED ORDER — DEXCOM G6 TRANSMITTER MISC: 1.0000 | 3 refills | Status: DC

## 2021-09-11 NOTE — Telephone Encounter (Signed)
Brand Males, MD  9 PM Note Lynn [name] -had overnight pulse oximetry on 08/09/2021.  She did this for 6 hours and 22 minutes and 52 seconds.  Time spent less than or equal to 88% was 9 minutes and 12 seconds  Plan - If she wants to return the oxygen she can.  It is minimum desaturation.  Technically she qualifies for nighttime oxygen 2 L at night but it is only 9 minutes and therefore if she does not want to use it that is fine      Patient is aware of results and voiced her understanding.  Patient stated that she currently wears 3L at night. She wore 3L during ONO.  Dr. Chase Caller, please advise. Thanks

## 2021-09-11 NOTE — Patient Instructions (Addendum)
Switch Victoza to OZempic 2 mg weekly    Pump    Omni pod Settings   Insulin type    Humalog   Basal rate       0000-09:30 2.00   09:30 - 0000 2.55          I:C ratio       0000-0600  1:8                  Sensitivity    15            Goal       0000  110

## 2021-09-11 NOTE — Telephone Encounter (Signed)
[place] sorry did not realize that on note. Ok she should just stay on 3L [place]

## 2021-09-11 NOTE — Progress Notes (Signed)
Name: Lynn [name]  Age/ Sex: 65 y.o., female   MRN/ DOB: [id number], 1956/10/13     PCP: [name], MD   Reason for Endocrinology Evaluation: Type 2 Diabetes Mellitus  Initial Endocrine Consultative Visit: 03/31/2019    Lynn [name] IDENTIFIER: Lynn [name] is a 65 y.o. female with a past medical history of T2DM, HTN, dyslipidemia,thalassemia, OSA on CPAP . The Lynn [name] has followed with Endocrinology clinic since 03/31/2019 for consultative assistance with management of Lynn [name] diabetes.  DIABETIC HISTORY:  Lynn [name] was diagnosed with T2DM in 2010, Lynn [name] has been on metformin in the past with reported prior intolerance due to diarrhea. Was on Glipizide at some point with no side effects.Has been on insulin for years. Lynn [name] hemoglobin A1c has ranged from  7.7% in 2018, peaking at 10.4% in 2020.  On Lynn [name] initial presentation to our clinic, Lynn [name] A1c was 10.4% , Lynn [name] was on Lantus and Victoza    [name] was tried in 03/2019 but developed severe dizziness .     Injectable methotrexate was started November 2020 due to rheumatoid arthritis.  Lynn [name] is intolerant to oral methotrexate   Started on Omnipod 10/2020  SUBJECTIVE:   During the last visit (03/18/2021): A1c 8.0 % we continued  Lantus and  Victoza, restarted Humalog      Today (09/11/2021): Lynn [name] is here for a month follow up on diabetes management. Lynn [name] has the Omnipod. But was recently discharged from the hospital and this disrupted the pump use.  Lynn [name] was admitted for pneumonitis due to MTX  . Lynn [name] is on O2 in 03/2021  Lynn [name] is interested in switching to Ozempic     This Lynn [name] with type 2 diabetes is treated with Omnipod (insulin pump). During the visit the pump basal and bolus doses were reviewed including carb/insulin rations and supplemental doses. The clinical list was updated. The glucose meter download was reviewed in detail to determine if the current pump settings are providing the best  glycemic control without excessive hypoglycemia.  Pump and meter download:    Pump   Omnipod Settings   Insulin type   Humalog    Basal rate       0000  2.0 u/h    0930 2.5          I:C ratio       0000 1:10                  Sensitivity       0000  15      Goal       0000  110            Type & Model of Pump: Omnipod Insulin Type: Currently using Humalog .  PUMP STATISTICS: Average BG: 274 BG Readings: 1.1/day Average Daily Carbs (g): 67.2 Average Total Daily Insulin: 63.7 Average Daily Basal: 46.5(73%) Average Daily Bolus: 17.2 (27%)       HOME DIABETES REGIMEN:  Victoza 1.8 mg daily  Humalog     CONTINUOUS GLUCOSE MONITORING RECORD INTERPRETATION    Dates of Recording: 11/5-11/28/2022  Sensor description:Dexcom  Results statistics:   CGM use % of time 100  Average and SD 211/74  Time in range  38%  % Time Above 180 32  % Time above 250 30  % Time Below target 0    Glycemic patterns summary: Hyperglycemia is noted most of the day and trends down at night  Hyperglycemic episodes  Post prandial  Hypoglycemic  episodes occurred no   Overnight periods: trends down    DIABETIC COMPLICATIONS: Microvascular complications:  Retinopathy (bleeding in the eye )  Denies: CKD, neuropathy  Last eye exam: Completed 08/19/2020   Macrovascular complications:  CAD Denies: PVD, CVA       HISTORY:  Past Medical History:  Past Medical History:  Diagnosis Date   Anxiety    Arthritis    "knees, wrists, back, elbows" (07/03/2017)   Clotting disorder ([place])    blood clot in eye 2016   Coronary artery disease, non-occlusive    a. 11/2016 NSTEMI/Cath: LM nl, LAD 40p, 34m, D1/2 small, LCX 40ost, OM2/3 nl/small, RCA 35 ost/mid, RPDA/RPL small/nl.   Coronary vasospasm ([facility])    Depression    Diastolic dysfunction    a. 11/2016 Echo: EF 55-60%, gr1 DD, sev calcified MV annulus, mildly dil LA.   Eye hemorrhage 01/2015   "right; resolved"  (07/03/2017)   Headache    "monthly" (07/03/2017)   History of blood transfusion    "when I had an ectopic pregnancy"   History of stomach ulcers    Hyperlipidemia    Hypertension    Microcytic anemia    Moderate mitral stenosis by prior echocardiogram 03/2021   TTE: Mod MS (mean gradient 9 mmHg). -- TEE Moderate-Severe MS (mean gradient 14 mmHg) with fixed Post MV Leaflet & Severe MAC w/ large circumscribed calcified mass on the Post MV Leaflet. (Mass previously described in 2018) - CMRI identifies calcified mass & not Tumor.; R&LHC - LVEDP & PCWP both 23 mmHg indicating minimal resting gradient   OSA on CPAP    setting is unknown   PAF [place]oxysmal [place]) [place] 03[place]   Event Monitor Aug-Sept 2022: Predominantly sinus rhythm.  1% A. fib burden.  30 brief episodes of PAT.  2 short bursts of NSVT.   [place]    Event monitor from August 2022 showed 30 brief bursts longest 14 seconds.   Pneumonia    "several times" (07/03/2017)   PVC's (premature ventricular contractions)    Syncope 07/03/2017 X 2   called seizures but no medications   Thalassemia    "my cells are sickle cell shaped but I don't have sickle cell anemia" (07/03/2017)   [place] II diabetes mellitus [place]    Past Surgical History:  Past Surgical History:  Procedure Laterality Date   48-Hour Monitor  03/18/2017   Sinus rhythm with sinus tachycardia (rate 58-134 BPM)multiple PVCs noted with couplets and bigeminy. One triplet. 7 runs of PAT ranging from 100-130 bpm. Longest was 33 beats.   BIOPSY  04/03/2021   Procedure: BIOPSY;  Surgeon: AYetta Flock MD;  [facility];  Service: Gastroenterology;;   BREAST BIOPSY Left    BRONCHIAL WASHINGS  03/28/2021   Procedure: BRONCHIAL WASHINGS;  Surgeon: CJulian Hy DO;  [place] [facility]  Service: Endoscopy;;   CARDIAC CATHETERIZATION N/A 11/19/2016   Procedure: Left Heart Cath and Coronary Angiography;   Surgeon: HBelva Crome MD;  [facility];  Service: Cardiovascular.    LM nl, LAD 40p, 570m D1/2 small, LCX 40ost, OM2/3 nl/small, RCA 35 ost/mid, RPDA/RPL small/nl.   CARDIAC MRI  03/30/2021   Normal LVEF ~53%. 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 No Scar or Late Gadolinium Enhancement on LV Myocardium.   CARPAL TUNNEL RELEASE Right 11/01/2014   COLONOSCOPY     DILATION AND CURETTAGE OF UTERUS  ECTOPIC PREGNANCY SURGERY  X 2   ESOPHAGOGASTRODUODENOSCOPY (EGD) WITH PROPOFOL N/A 04/03/2021   Procedure: ESOPHAGOGASTRODUODENOSCOPY (EGD) WITH PROPOFOL;  Surgeon: Yetta Flock, MD;  [facility];  Service: Gastroenterology;  Laterality: N/A;   ESOPHAGOGASTRODUODENOSCOPY (EGD) WITH PROPOFOL N/A 08/15/2021   Procedure: ESOPHAGOGASTRODUODENOSCOPY (EGD) WITH PROPOFOL;  Surgeon: Ladene Artist, MD;  Location: [facility];  Service: Endoscopy;  Laterality: N/A;   JOINT REPLACEMENT     KNEE ARTHROSCOPY Left 11/2014   RIGHT/LEFT HEART CATH AND CORONARY ANGIOGRAPHY N/A 08/08/2021   Procedure: RIGHT/LEFT HEART CATH AND CORONARY ANGIOGRAPHY;  Surgeon: Leonie Man, MD;  Location: [facility];  Service: Cardiovascular;Ost LCx 40%. Ost-Prox RCA 35%. Prox-Mid LAD 25%-<25% D2. Mod-Severely Elevated LVEDP. Mild PA HTN -> PA mean 31 mmHg with a PCWP and LVEDP of 23 mmHg   TEE WITHOUT CARDIOVERSION N/A 03/28/2021   Procedure: TRANSESOPHAGEAL ECHOCARDIOGRAM (TEE);  Surgeon: Thayer Headings, MD;  [facility];; LVEF 55-60%. Normal LV Fxn & no RWMA. No LAA thrombus. Gr II Ao Plaque. Large circumscribed calcific mass (1.8 x 1.4 cm) anterior to posterior annulus.  Fixed posterior leaflet with Mod-Severe MS (Mean MVG ~14 mmHg, Peak 20 mmhg).  Mild MR   TONSILLECTOMY     TOTAL KNEE ARTHROPLASTY Right 02/18/2015   Procedure: RIGHT TOTAL KNEE ARTHROPLASTY;  Surgeon: Netta Cedars, MD;  Location: [place];  Service:  Orthopedics;  Laterality: Right;   TOTAL KNEE ARTHROPLASTY Left 08/19/2015   Procedure: LEFT TOTAL KNEE ARTHROPLASTY;  Surgeon: Netta Cedars, MD;  Location: [facility];  Service: Orthopedics;  Laterality: Left;   TRANSTHORACIC ECHOCARDIOGRAM  11/2016; 07/2017:   a) normal LV size, thickness and function. EF 55-60%. GR 1 DD.No RWMA severe mitral annular calcification, but no mitral stenosis. Mild LA dilation;; b) normal LV size and function.  EF 65-75%.  Unable to assess diastolic function.  Aortic sclerosis with no stenosis.  Moderate mitral stenosis? (Gradient 9 mmHg) -?  Mild-mod left atrial enlargement    TRANSTHORACIC ECHOCARDIOGRAM  01/2019   EF 65 to 70%.  Normal function.  Elevated LVEDP-GR 1 DD.  Normal RV size and function.  Large calcific mass on posterior mitral leaflet mild to moderate mitral stenosis.   TRANSTHORACIC ECHOCARDIOGRAM  03/24/2021   EF 60 to 65%.  LV function with normal wall motion.  Moderate basal septal LVH.  GRII DD & Mild LA dilation.-> elevated LVEDP.  Normal RV function.  Mildly elevated PAP.  Ill-defined density measuring 3.23 x 2.1 cm region of the posterior MV leaflet along with dense MAC.  Moderate MS (mean MVG of 9 mmHg.) Normal AoV & RAP. --> Recommend TEE 2/2 concern for MV SBE.   VAGINAL HYSTERECTOMY     VIDEO BRONCHOSCOPY N/A 03/28/2021   Procedure: VIDEO BRONCHOSCOPY WITHOUT FLUORO;  Surgeon: Julian Hy, DO;  Location: [facility];  Service: Endoscopy;  Laterality: N/A;   Social History:  reports that Lynn [name] quit smoking about 6 years ago. Lynn [name] smoking use included cigarettes. Lynn [name] has a 11.00 pack-year smoking history. Lynn [name] has never used smokeless tobacco. Lynn [name] reports that Lynn [name] does not currently use alcohol. Lynn [name] reports that Lynn [name] does not use drugs. Family History:  Family History  Problem Relation Age of Onset   Cancer Mother    Diabetes Mother    Hypertension Mother    Hyperlipidemia Mother    Cancer Father    Hyperlipidemia Father    Mental illness  Sister    Diabetes Sister    Diabetes Maternal  Grandmother    Diabetes Maternal Grandfather    Colon cancer Neg Hx    Esophageal cancer Neg Hx    Stomach cancer Neg Hx    Rectal cancer Neg Hx      HOME MEDICATIONS: Allergies as of 09/11/2021       Reactions   Penicillins Hives   Childhood allergy Has Lynn [name] had a PCN reaction causing immediate rash, facial/tongue/throat swelling, SOB or lightheadedness with hypotension: Yes Has Lynn [name] had a PCN reaction causing severe rash involving mucus membranes or skin necrosis: No Has Lynn [name] had a PCN reaction that required hospitalization No Has Lynn [name] had a PCN reaction occurring within the last 10 years: No If all of the above answers are "NO", then may proceed with Cephalosporin use.   Metformin And Related Other (See Comments)   Diarrhea, bleeding   Farxiga [dapagliflozin] Other (See Comments)   Dizzy and lethargic        Medication List        Accurate as of September 11, 2021  2:53 PM. If you have any questions, ask your nurse or doctor.          acetaminophen 500 MG tablet Commonly known as: TYLENOL Take 500 mg by mouth every 6 (six) hours as needed for fever.   acetaminophen-codeine 300-30 MG tablet Commonly known as: TYLENOL #3 Take 1-2 tablets by mouth every 8 (eight) hours as needed for moderate pain.   albuterol 108 (90 Base) MCG/ACT inhaler Commonly known as: VENTOLIN HFA Inhale 2 puffs into the lungs every 6 (six) hours as needed for wheezing or shortness of breath.   apixaban 5 MG Tabs tablet Commonly known as: ELIQUIS Take 1 tablet (5 mg total) by mouth 2 (two) times daily.   atorvastatin 80 MG tablet Commonly known as: LIPITOR TAKE 1 TABLET(80 MG) BY MOUTH DAILY AT 6 PM   B-D UF III MINI PEN NEEDLES 31G X 5 MM Misc Generic drug: Insulin Pen Needle USE DAILY WITH VICTOZA AND LANTUS.   Insulin Pen Needle 31G X 5 MM Misc 1 Device by Does not apply route 3 (three) times daily.   cetirizine 10 MG  tablet Commonly known as: ZYRTEC Take 10 mg by mouth daily as needed for allergies.   Contour Next Test test strip Generic drug: glucose blood 3x daily   Dexcom G6 Sensor Misc 1 Device by Does not apply route as directed.   Dexcom G6 Transmitter Misc 1 Device by Does not apply route as directed.   diltiazem 360 MG 24 hr capsule Commonly known as: CARDIZEM CD Take 1 capsule (360 mg total) by mouth daily.   folic acid 1 MG tablet Commonly known as: FOLVITE Take 2 tablets (2 mg total) by mouth daily.   furosemide 40 MG tablet Commonly known as: Lasix Take 1 tablet (40 mg total) by mouth daily. Take additional dose as needed for egt gain> 3 lb in 1 day   insulin glargine 100 UNIT/ML injection Commonly known as: LANTUS Inject 20 Units into the skin as needed. 20 units yesterday at 730 pm   insulin lispro 100 UNIT/ML injection Commonly known as: HUMALOG Max Daily 100 units per pump   Lancets 30G Misc 1 Device by Does not apply route 3 (three) times daily.   leflunomide 20 MG tablet Commonly known as: ARAVA Take 20 mg by mouth daily.   losartan 50 MG tablet Commonly known as: COZAAR Take 1 tablet (50 mg total) by mouth daily.   Melatonin 5 MG  Chew Chew 5 mg by mouth at bedtime.   multivitamin with minerals Tabs tablet Take 1 tablet by mouth daily.   Omnipod DASH Pods (Gen 4) Misc Inject 1 Dose into the skin continuous. Using pump - depends on carb intake.   pantoprazole 40 MG tablet Commonly known as: PROTONIX Take 1 tablet (40 mg total) by mouth 2 (two) times daily before a meal.   predniSONE 5 MG tablet Commonly known as: DELTASONE Take 5 mg by mouth daily.   SAFETY-LOK TB SYRINGE 27GX.5" 27G X 1/2" 1 ML Misc Generic drug: TUBERCULIN SYR 1CC/27GX1/2"   Salonpas Pain Relief Patch Ptch Apply 1 patch topically daily as needed (pain).   traZODone 50 MG tablet Commonly known as: DESYREL TAKE 0.5-1 TABLETS BY MOUTH AT BEDTIME AS NEEDED FOR SLEEP.   VICKS  SINEX NA Place 1 spray into the nose daily as needed (congestion).   Victoza 18 MG/3ML Sopn Generic drug: liraglutide INJECT 1.8 MG UNDER THE SKIN ONCE DAILY         OBJECTIVE:   Vital Signs: BP 140/90 (BP Location: Right Arm, Lynn [name] Position: Sitting, Cuff Size: Large)   Pulse 84   Ht _0  (1.6 m)   Wt 248 lb (112.5 kg)   LMP  (LMP Unknown)   SpO2 99%   BMI 43.93 kg/m     Wt Readings from Last 3 Encounters:  09/11/21 248 lb (112.5 kg)  08/15/21 254 lb 12.8 oz (115.6 kg)  08/14/21 254 lb 9.6 oz (115.5 kg)     Exam: General: Lynn [name] appears well on O2 through [place]  Lungs: Clear with good BS bilat   Heart: RRR  Extremities: No pretibial edema  Neuro: MS is good with appropriate affect, Lynn [name] is alert and Ox3       DM foot exam: 01/30/2021 The skin of the feet is intact without sores or ulcerations. The pedal pulses are 2+ on right and 2+ on left. The sensation is intact to a screening 5.07, 10 gram monofilament bilaterally    DATA REVIEWED:  Lab Results  Component Value Date   HGBA1C 8.3 (A) 06/09/2021   HGBA1C 8.0 (A) 01/30/2021   HGBA1C 8.5 (A) 09/23/2020   Lab Results  Component Value Date   MICROALBUR 2.3 (H) 01/30/2021   LDLCALC 44 08/22/2020   CREATININE 1.06 (H) 07/31/2021   Lab Results  Component Value Date   MICRALBCREAT 1.4 01/30/2021     Lab Results  Component Value Date   CHOL 97 08/22/2020   HDL 38.80 (L) 08/22/2020   LDLCALC 44 08/22/2020   LDLDIRECT 131 (H) 09/02/2013   TRIG 74.0 08/22/2020   CHOLHDL 3 08/22/2020       Results for [name] "PAT" (MRN [id number]) as of 01/31/2021 11:34  Ref. Range 01/30/2021 15:46  Creatinine,U Latest Units: mg/dL 170.0  Microalb, Ur Latest Ref Range: 0.0 - 1.9 mg/dL 2.3 (H)  MICROALB/CREAT RATIO Latest Ref Range: 0.0 - 30.0 mg/g 1.4    ASSESSMENT / PLAN / RECOMMENDATIONS:   1) Type 2 Diabetes Mellitus, Poorly controlled, With retinopathy and macrovascular complications - Most  recent A1c of 9.1 %. Goal A1c <7.0 %.     - Lynn [name] continues with hyperglycemia , this is mainly noted during the day, Lynn [name] eneters 63 grams of CHO total a day which is low, but Lynn [name] assures me Lynn [name] is counting correctly, I will change Lynn [name] I:C ratio and increase daytime basal rate  - Lynn [name] would like to switch Victoza to Ozempic  MEDICATIONS: Stop Victoza 1.8 mg  Start Ozempic 2 mg weekly  Humalog per pump  Pump    Omni pod Settings   Insulin type    Humalog   Basal rate       0000-09:30 2.00   09:30 - 0000 2.55          I:C ratio       0000-0600  1:8                  Sensitivity       0000  15      Goal       0000  100- 110         EDUCATION / INSTRUCTIONS: BG monitoring instructions: Lynn [name] is instructed to check Lynn [name] blood sugars 3 times a day, fasting , supper and bedtime. Call [facility] if: BG persistently < 70  I reviewed the Rule of 15 for the treatment of hypoglycemia in detail with the Lynn [name]. Literature supplied.    F/U in 3 months    Signed electronically by: Mack Guise, MD  [facility]  [address] [place], [place] [address] Phone: [phone] FAX: [phone]   CC: [name], [address] [place] [address] Phone: [phone]  Fax: [phone]  Return to Endocrinology clinic as below: Future Appointments  Date Time Provider [place]  10/27/2021 10:20 AM Leonie Man, MD [facility]  06/14/2022  1:00 PM [facility]

## 2021-09-12 NOTE — Telephone Encounter (Signed)
Patient is aware of below message and voiced her understanding. She owns her concentrator from [employer], therefore no order is needed. Nothing further needed at this time.

## 2021-09-13 ENCOUNTER — Telehealth: Payer: Self-pay

## 2021-09-13 NOTE — Telephone Encounter (Signed)
Paperwork and script has been faxed to [facility]

## 2021-09-14 DIAGNOSIS — Z794 Long term (current) use of insulin: Secondary | ICD-10-CM | POA: Diagnosis not present

## 2021-09-14 DIAGNOSIS — E1165 Type 2 diabetes mellitus with hyperglycemia: Secondary | ICD-10-CM | POA: Diagnosis not present

## 2021-09-15 ENCOUNTER — Other Ambulatory Visit: Payer: Self-pay | Admitting: Family Medicine

## 2021-09-15 ENCOUNTER — Other Ambulatory Visit: Payer: Self-pay | Admitting: Cardiology

## 2021-09-15 ENCOUNTER — Other Ambulatory Visit: Payer: Self-pay | Admitting: Primary Care

## 2021-09-15 DIAGNOSIS — I10 Essential (primary) hypertension: Secondary | ICD-10-CM

## 2021-10-20 ENCOUNTER — Encounter: Payer: Self-pay | Admitting: Family Medicine

## 2021-10-23 ENCOUNTER — Other Ambulatory Visit: Payer: Self-pay

## 2021-10-23 DIAGNOSIS — M545 Low back pain, unspecified: Secondary | ICD-10-CM

## 2021-10-24 DIAGNOSIS — M2550 Pain in unspecified joint: Secondary | ICD-10-CM | POA: Diagnosis not present

## 2021-10-24 DIAGNOSIS — R5382 Chronic fatigue, unspecified: Secondary | ICD-10-CM | POA: Diagnosis not present

## 2021-10-24 DIAGNOSIS — M0579 Rheumatoid arthritis with rheumatoid factor of multiple sites without organ or systems involvement: Secondary | ICD-10-CM | POA: Diagnosis not present

## 2021-10-24 DIAGNOSIS — J704 Drug-induced interstitial lung disorders, unspecified: Secondary | ICD-10-CM | POA: Diagnosis not present

## 2021-10-24 LAB — CBC AND DIFFERENTIAL
HCT: 32 — AB (ref 36–46)
Hemoglobin: 9.6 — AB (ref 12.0–16.0)
Platelets: 281 (ref 150–399)
WBC: 8.8

## 2021-10-24 LAB — HEPATIC FUNCTION PANEL
ALT: 13 (ref 7–35)
AST: 16 (ref 13–35)
Alkaline Phosphatase: 99 (ref 25–125)

## 2021-10-24 LAB — BASIC METABOLIC PANEL
BUN: 15 (ref 4–21)
CO2: 29 — AB (ref 13–22)
Chloride: 95 — AB (ref 99–108)
Creatinine: 1.4 — AB (ref 0.5–1.1)
Glucose: 105
Potassium: 3.9 (ref 3.4–5.3)
Sodium: 139 (ref 137–147)

## 2021-10-24 LAB — COMPREHENSIVE METABOLIC PANEL
Albumin: 4.4 (ref 3.5–5.0)
Calcium: 10.1 (ref 8.7–10.7)

## 2021-10-25 ENCOUNTER — Telehealth: Payer: Self-pay | Admitting: *Deleted

## 2021-10-25 NOTE — Telephone Encounter (Signed)
Called patient to confirm whether she would like a  telehealth visit- video or in office visit for 10/27/21 . patient states she was planning to come to the office.  RN  informed patient will be glad to see her at 10:20 timeslot.  RN changed  appointment format.

## 2021-10-27 ENCOUNTER — Ambulatory Visit: Payer: Medicare Other | Admitting: Cardiology

## 2021-10-27 ENCOUNTER — Other Ambulatory Visit: Payer: Self-pay

## 2021-10-27 ENCOUNTER — Encounter: Payer: Self-pay | Admitting: Cardiology

## 2021-10-27 VITALS — BP 126/72 | HR 85 | Ht 63.0 in | Wt 248.0 lb

## 2021-10-27 DIAGNOSIS — I2510 Atherosclerotic heart disease of native coronary artery without angina pectoris: Secondary | ICD-10-CM | POA: Diagnosis not present

## 2021-10-27 DIAGNOSIS — I050 Rheumatic mitral stenosis: Secondary | ICD-10-CM

## 2021-10-27 DIAGNOSIS — I5032 Chronic diastolic (congestive) heart failure: Secondary | ICD-10-CM

## 2021-10-27 DIAGNOSIS — G4733 Obstructive sleep apnea (adult) (pediatric): Secondary | ICD-10-CM | POA: Diagnosis not present

## 2021-10-27 DIAGNOSIS — E785 Hyperlipidemia, unspecified: Secondary | ICD-10-CM | POA: Diagnosis not present

## 2021-10-27 DIAGNOSIS — E1169 Type 2 diabetes mellitus with other specified complication: Secondary | ICD-10-CM

## 2021-10-27 DIAGNOSIS — R195 Other fecal abnormalities: Secondary | ICD-10-CM | POA: Diagnosis not present

## 2021-10-27 DIAGNOSIS — I201 Angina pectoris with documented spasm: Secondary | ICD-10-CM

## 2021-10-27 DIAGNOSIS — I480 Paroxysmal atrial fibrillation: Secondary | ICD-10-CM

## 2021-10-27 DIAGNOSIS — I471 Supraventricular tachycardia: Secondary | ICD-10-CM

## 2021-10-27 DIAGNOSIS — I10 Essential (primary) hypertension: Secondary | ICD-10-CM | POA: Diagnosis not present

## 2021-10-27 DIAGNOSIS — Z9989 Dependence on other enabling machines and devices: Secondary | ICD-10-CM

## 2021-10-27 MED ORDER — FUROSEMIDE 40 MG PO TABS: ORAL_TABLET | ORAL | 11 refills | Status: DC

## 2021-10-27 MED ORDER — APIXABAN 5 MG PO TABS: 5.0000 mg | ORAL_TABLET | Freq: Two times a day (BID) | ORAL | 3 refills | Status: DC

## 2021-10-27 NOTE — Progress Notes (Signed)
Primary Care Provider: [name], MD Cardiologist: Glenetta Hew, MD Electrophysiologist: None  Clinic Note: Chief Complaint  Patient presents with   Follow-up    Doing okay   Atrial Fibrillation    Tolerating okay.  Appears to be maintaining sinus rhythm.  No bleeding   Congestive Heart Failure    Breathing better.  Lites been stable.  Not using PRN additional diuretic.   Cardiac Valve Problem    Mitral stenosis/MAC, not significant enough to have symptoms.    ===================================  ASSESSMENT/PLAN   Problem List Items Addressed This Visit       Cardiology Problems   Essential hypertension (Chronic)    Stable BP no change in meds.  Would not want to let her get to hypotensive because of concerns for dizziness.  I chest back down losartan to 50 mg I think were stable at that dose.      Relevant Medications   apixaban (ELIQUIS) 5 MG TABS tablet   Coronary artery disease, non-occlusive (Chronic)    No significant found on recent cardiac cath.  Plan: Continue diltiazem for microvascular disease/spasm.  Also on high-dose atorvastatin. ARB for afterload reduction.      Relevant Medications   apixaban (ELIQUIS) 5 MG TABS tablet   Other Relevant Orders   EKG 12-Lead (Completed)   ECHOCARDIOGRAM COMPLETE   PAT (paroxysmal atrial tachycardia) (HCC) (Chronic)    Also short bursts of PAT for atrial runs.  She remains on diltiazem and is less symptomatic.      Relevant Medications   apixaban (ELIQUIS) 5 MG TABS tablet   PAF (paroxysmal atrial fibrillation) ([place]); CHA2DS2-VASc score 7 - Primary (Chronic)    Relatively low burden.  At reassuring because she was symptomatic when she had it.  On stable dose of diltiazem 360 mg for rate control.  Holding off on rhythm control. Tolerating Eliquis.  No bleeding issues.      Relevant Medications   apixaban (ELIQUIS) 5 MG TABS tablet   Other Relevant Orders   EKG 12-Lead (Completed)    ECHOCARDIOGRAM COMPLETE   Coronary artery spasm (HCC) (Chronic)    With the need for rate control, using diltiazem for rate control and antispasm medication.  I still think some of her chest pain is musculoskeletal and not cardiac.      Relevant Medications   apixaban (ELIQUIS) 5 MG TABS tablet   Hyperlipidemia associated with type 2 diabetes mellitus ([place]) (Chronic)    I do not see labs since 2021.  PCP is not following. She is on 80 mg atorvastatin for her lipids.  She is on Humalog insulin to stabilize and treat.  Monitored by endocrinology.      Relevant Medications   apixaban (ELIQUIS) 5 MG TABS tablet   Chronic diastolic CHF (congestive heart failure) (HCC) (Chronic)    Euvolemic on exam.  Not necessarily noting PND orthopnea. On losartan for afterload reduction, and preferentially on diltiazem over beta-blocker for rate control and needs ablation.  With Recent dizzy spells the plan will be for her to reduce her Lasix dose to 20 mg daily with additional 20 mg for weight gain more than 3 pounds.      Relevant Medications   apixaban (ELIQUIS) 5 MG TABS tablet   Other Relevant Orders   ECHOCARDIOGRAM COMPLETE   Moderate mitral stenosis by prior echocardiography (Chronic)    Relatively stable cardiac findings and Did not have significant elevated pressures.  Intermittent follow-up with echocardiogram.  Relevant Medications   apixaban (ELIQUIS) 5 MG TABS tablet   Other Relevant Orders   EKG 12-Lead (Completed)   ECHOCARDIOGRAM COMPLETE     Other   OSA on CPAP   Occult GI bleeding    Thankfully, blood counts been stable since starting Eliquis.      Morbid obesity due to excess calories (HCC) (Chronic)    The patient understands the need to lose weight with diet and exercise. We have discussed specific strategies for this. Unfortunate, she is limited by lack of mobility from her arthritis pains.       ===================================  HPI:    Lynn [name] is a 66 y.o. female with a PMH below who presents today for 3 month f/u .  Notable PMH: Mitral Stenosis with Severe MAC-with echocardiogram and MRI noting posterior leaflet mass that is heavy calcification-not suspicious for mass. New diagnosis of PAROXYSMAL ATRIAL FIBRILLATION -> delayed initiation of DOAC because of PUD/Upper GI bleed, and concern for Dural AVM EGD showed gastric ulcers CHA2DS2-VASc score 6: (CHF, HTN, DM-2, aortic plaque, age, female) Started on Eliquis 5 mg twice daily in October 2022 Chronic HFpEF -> LVEDP and PCWP of 23 mmHg on [facility]  Rheumatoid Arthritis History of methotrexate related lung injury/hypersensitivity pneumonitis -> was on home O2. Presumed Coronary Spasm Cardiac cath showed minimal CAD  Lynn [name] was last seen on August 14, 2021 for post-cath revi follow-up.  She had been recovering from her hospitalizations for methotrexate hypersensitivity lung injury.  She was still noticing off-and-on chest pain lasting 5 to 10 minutes but not associate with exertion.  Occasional dizzy spells lasting a couple minutes associated with headache-may or may not be positional.  No significant palpitations.  DOE noted but deconditioning.  No PND orthopnea.  No significant edema.  Felt better after diuresis.  Detailed review of heart cath. Started Eliquis 5 mg twice daily Recommended holding Lasix when not feeling well.  Recent Hospitalizations:  EGD done on 08/15/2021: Small hiatal hernia and gastric erosions noted.  Reviewed  CV studies:    The following studies were reviewed today: (if available, images/films reviewed: From Epic Chart or Care Everywhere) No new studies:  Interval History:   Lynn [name] presents for routine follow-up.  Still on home oxygen.  Noting more frequent dizziness even with sitting down.  Also notes some exertional dyspnea. She is very much limited by her arthritis pains mostly back pain.  She still has  off-and-on chest pain spells that are no different than it had been before. Not really, too much about edema.  Has not had to take additional doses.  CV Review of Symptoms (Summary) Cardiovascular ROS: positive for - chest pain, dyspnea on exertion, edema, orthopnea, palpitations, and shortness of breath negative for - paroxysmal nocturnal dyspnea, rapid heart rate, or syncope or near syncope.  Just dizziness.  No TIA or amaurosis fugax.  REVIEWED OF SYSTEMS   Review of Systems  Constitutional:  Positive for malaise/fatigue.  Respiratory:  Positive for cough and shortness of breath.   Cardiovascular:        Per HPI  Gastrointestinal:  Negative for blood in stool and melena.  Genitourinary:  Negative for hematuria.  Musculoskeletal:  Positive for back pain, joint pain (Diffuse) and myalgias. Negative for falls.  Neurological:  Positive for dizziness.  Psychiatric/Behavioral:  Negative for depression and memory loss. The patient is nervous/anxious and has insomnia.    I have reviewed and (if needed) personally updated the  patient's problem list, medications, allergies, past medical and surgical history, social and family history.   PAST MEDICAL HISTORY   Past Medical History:  Diagnosis Date   Anxiety    Clotting disorder ([place])    blood clot in eye 2016   Coronary artery disease, non-occlusive    a. 11/2016 NSTEMI/Cath: LM nl, LAD 40p, 11m, D1/2 small, LCX 40ost, OM2/3 nl/small, RCA 35 ost/mid, RPDA/RPL small/nl.   Coronary vasospasm (HCC)    Depression    Diastolic dysfunction    a. 11/2016 Echo: EF 55-60%, gr1 DD, sev calcified MV annulus, mildly dil LA.   Eye hemorrhage 01/2015   "right; resolved" (07/03/2017)   Headache    "monthly" (07/03/2017)   History of blood transfusion    "when I had an ectopic pregnancy"   History of stomach ulcers    Hyperlipidemia    Hypertension    Microcytic anemia    Moderate mitral stenosis by prior echocardiogram 03/2021   TTE: Mod MS (mean  gradient 9 mmHg). -- TEE Moderate-Severe MS (mean gradient 14 mmHg) with fixed Post MV Leaflet & Severe MAC w/ large circumscribed calcified mass on the Post MV Leaflet. (Mass previously described in 2018) - CMRI identifies calcified mass & not Tumor.; R&LHC - LVEDP & PCWP both 23 mmHg indicating minimal resting gradient   OSA on CPAP    setting is unknown   PAF (paroxysmal atrial fibrillation) ([place] 03/2021   Event Monitor Aug-Sept 2022: Predominantly sinus rhythm.  1% A. fib burden.  30 brief episodes of PAT.  2 short bursts of NSVT.   PAT (paroxysmal atrial tachycardia) ([place]    Event monitor from August 2022 showed 30 brief bursts longest 14 seconds.   Pneumonia    "several times" (07/03/2017)   PVC's (premature ventricular contractions)    Rheumatoid arthritis ([place]    Syncope 07/03/2017 X 2   called seizures but no medications   Thalassemia    "my cells are sickle cell shaped but I don't have sickle cell anemia" (07/03/2017)   Type II diabetes mellitus ([place]     PAST SURGICAL HISTORY   Past Surgical History:  Procedure Laterality Date   48-Hour Monitor  03/18/2017   Sinus rhythm with sinus tachycardia (rate 58-134 BPM)multiple PVCs noted with couplets and bigeminy. One triplet. 7 runs of PAT ranging from 100-130 bpm. Longest was 33 beats.   BIOPSY  04/03/2021   Procedure: BIOPSY;  Surgeon: AYetta Flock MD;  [facility];  Service: Gastroenterology;;   BREAST BIOPSY Left    BRONCHIAL WASHINGS  03/28/2021   Procedure: BRONCHIAL WASHINGS;  Surgeon: CJulian Hy DO;  [facility]  Service: Endoscopy;;   CARDIAC CATHETERIZATION N/A 11/19/2016   Procedure: Left Heart Cath and Coronary Angiography;  Surgeon: HBelva Crome MD;  [facility];  Service: Cardiovascular.    LM nl, LAD 40p, 588m D1/2 small, LCX 40ost, OM2/3 nl/small, RCA 35 ost/mid, RPDA/RPL small/nl.   CARDIAC MRI  03/30/2021   Normal LVEF ~53%. Posterolateral Mitral Annular  Mass c/w Degenerative MAC (Also seen on HR CT Chest). No Scar or Late Gadolinium Enhancement on LV Myocardium.   CARPAL TUNNEL RELEASE Right 11/01/2014   COLONOSCOPY     DILATION AND CURETTAGE OF UTERUS     ECTOPIC PREGNANCY SURGERY  X 2   ESOPHAGOGASTRODUODENOSCOPY (EGD) WITH PROPOFOL N/A 04/03/2021   Procedure: ESOPHAGOGASTRODUODENOSCOPY (EGD) WITH PROPOFOL;  Surgeon: ArYetta FlockMD;  [facility];  Service: Gastroenterology;  Laterality: N/A;   ESOPHAGOGASTRODUODENOSCOPY (EGD) WITH PROPOFOL N/A 08/15/2021   Procedure: ESOPHAGOGASTRODUODENOSCOPY (EGD) WITH PROPOFOL;  Surgeon: Ladene Artist, MD;  Location: [facility];  Service: Endoscopy;  Laterality: N/A;   JOINT REPLACEMENT     KNEE ARTHROSCOPY Left 11/2014   RIGHT/LEFT HEART CATH AND CORONARY ANGIOGRAPHY N/A 08/08/2021   Procedure: RIGHT/LEFT HEART CATH AND CORONARY ANGIOGRAPHY;  Surgeon: Leonie Man, MD;  Location: [place] [facility];  Service: Cardiovascular;Ost LCx 40%. Ost-Prox RCA 35%. Prox-Mid LAD 25%-<25% D2. Mod-Severely Elevated LVEDP. Mild PA HTN -> PA mean 31 mmHg with a PCWP and LVEDP of 23 mmHg   TEE WITHOUT CARDIOVERSION N/A 03/28/2021   Procedure: TRANSESOPHAGEAL ECHOCARDIOGRAM (TEE);  Surgeon: Thayer Headings, MD;  Location: [facility]COPY;; LVEF 55-60%. Normal LV Fxn & no RWMA. No LAA thrombus. Gr II Ao Plaque. Large circumscribed calcific mass (1.8 x 1.4 cm) anterior to posterior annulus.  Fixed posterior leaflet with Mod-Severe MS (Mean MVG ~14 mmHg, Peak 20 mmhg).  Mild MR   TONSILLECTOMY     TOTAL KNEE ARTHROPLASTY Right 02/18/2015   Procedure: RIGHT TOTAL KNEE ARTHROPLASTY;  Surgeon: Netta Cedars, MD;  Location: [place];  Service: Orthopedics;  Laterality: Right;   TOTAL KNEE ARTHROPLASTY Left 08/19/2015   Procedure: LEFT TOTAL KNEE ARTHROPLASTY;  Surgeon: Netta Cedars, MD;  Location: [place];  Service: Orthopedics;  Laterality: Left;   TRANSTHORACIC ECHOCARDIOGRAM  11/2016; 07/2017:   a) normal  LV size, thickness and function. EF 55-60%. GR 1 DD.No RWMA severe mitral annular calcification, but no mitral stenosis. Mild LA dilation;; b) normal LV size and function.  EF 65-75%.  Unable to assess diastolic function.  Aortic sclerosis with no stenosis.  Moderate mitral stenosis? (Gradient 9 mmHg) -?  Mild-mod left atrial enlargement    TRANSTHORACIC ECHOCARDIOGRAM  01/2019   EF 65 to 70%.  Normal function.  Elevated LVEDP-GR 1 DD.  Normal RV size and function.  Large calcific mass on posterior mitral leaflet mild to moderate mitral stenosis.   TRANSTHORACIC ECHOCARDIOGRAM  03/24/2021   EF 60 to 65%.  LV function with normal wall motion.  Moderate basal septal LVH.  GRII DD & Mild LA dilation.-> elevated LVEDP.  Normal RV function.  Mildly elevated PAP.  Ill-defined density measuring 3.23 x 2.1 cm region of the posterior MV leaflet along with dense MAC.  Moderate MS (mean MVG of 9 mmHg.) Normal AoV & RAP. --> Recommend TEE 2/2 concern for MV SBE.   VAGINAL HYSTERECTOMY     VIDEO BRONCHOSCOPY N/A 03/28/2021   Procedure: VIDEO BRONCHOSCOPY WITHOUT FLUORO;  Surgeon: Julian Hy, DO;  Location: [facility];  Service: Endoscopy;  Laterality: N/A;    Event monitor 8-06/2021: 1% A-fib burden.  Longest duration 1 hour 5 minutes.  Heart rate range 53 to 170 bpm.  Otherwise sinus rhythm 55 to 1:40 PM, average 81 bpm.  30 atrial runs 6-14 beats with an average rate between 115 and 156 bpm.  2 episodes of NSVT 9 beats-167 bpm.  Immunization History  Administered Date(s) Administered   Fluad Quad(high Dose 65+) 07/19/2021   Influenza Split 07/22/2012   Influenza, High Dose Seasonal PF 06/10/2019   Influenza,inj,Quad PF,6+ Mos 10/09/2014, 07/13/2015, 11/17/2016, 07/04/2017, 07/10/2018   Influenza-Unspecified 06/20/2020   Moderna Sars-Covid-2 Vaccination 11/27/2019, 12/26/2019, 06/22/2020, 12/30/2020, 06/21/2021   Pneumococcal Conjugate-13 03/23/2019   Pneumococcal Polysaccharide-23 08/22/2020    Pneumococcal-Unspecified 02/12/2010   Tdap 02/12/2010, 10/17/2016   Zoster Recombinat (Shingrix) 02/20/2021    MEDICATIONS/ALLERGIES  Current Meds  Medication Sig   acetaminophen (TYLENOL) 500 MG tablet Take 500 mg by mouth every 6 (six) hours as needed for fever.   acetaminophen-codeine (TYLENOL #3) 300-30 MG tablet Take 1-2 tablets by mouth every 8 (eight) hours as needed for moderate pain.   albuterol (VENTOLIN HFA) 108 (90 Base) MCG/ACT inhaler Inhale 2 puffs into the lungs every 6 (six) hours as needed for wheezing or shortness of breath.   atorvastatin (LIPITOR) 80 MG tablet TAKE 1 TABLET(80 MG) BY MOUTH DAILY AT 6 PM (Patient taking differently: Take 80 mg by mouth every evening.)   cetirizine (ZYRTEC) 10 MG tablet Take 10 mg by mouth daily as needed for allergies.   Continuous Blood Gluc Sensor (DEXCOM G6 SENSOR) MISC 1 Device by Does not apply route as directed.   Continuous Blood Gluc Transmit (DEXCOM G6 TRANSMITTER) MISC 1 Device by Does not apply route as directed.   diltiazem (CARDIZEM CD) 360 MG 24 hr capsule Take 1 capsule (360 mg total) by mouth daily.   folic acid (FOLVITE) 1 MG tablet Take 2 tablets (2 mg total) by mouth daily. (Patient taking differently: Take 1 mg by mouth in the morning and at bedtime.)   glucose blood (CONTOUR NEXT TEST) test strip 3x daily   insulin lispro (HUMALOG) 100 UNIT/ML injection Max Daily 100 units per pump   Insulin Pen Needle (B-D UF III MINI PEN NEEDLES) 31G X 5 MM MISC USE DAILY WITH VICTOZA AND LANTUS.   Insulin Pen Needle 31G X 5 MM MISC 1 Device by Does not apply route 3 (three) times daily.   Lancets 30G MISC 1 Device by Does not apply route 3 (three) times daily.   leflunomide (ARAVA) 20 MG tablet Take 20 mg by mouth daily.   losartan (COZAAR) 50 MG tablet Take 1 tablet (50 mg total) by mouth daily.   Melatonin 5 MG CHEW Chew 5 mg by mouth at bedtime.   Menthol-Methyl Salicylate (SALONPAS PAIN RELIEF PATCH) PTCH Apply 1 patch  topically daily as needed (pain).   Multiple Vitamin (MULTIVITAMIN WITH MINERALS) TABS tablet Take 1 tablet by mouth daily.   Oxymetazoline HCl (VICKS SINEX NA) Place 1 spray into the nose daily as needed (congestion).   pantoprazole (PROTONIX) 40 MG tablet Take 1 tablet (40 mg total) by mouth 2 (two) times daily before a meal.   predniSONE (DELTASONE) 5 MG tablet Take 5 mg by mouth daily.   SAFETY-LOK TB SYRINGE 27GX.5" 27G X 1/2" 1 ML MISC    Semaglutide, 2 MG/DOSE, 8 MG/3ML SOPN Inject 2 mg as directed once a week.   [DISCONTINUED] apixaban (ELIQUIS) 5 MG TABS tablet Take 1 tablet (5 mg total) by mouth 2 (two) times daily.   [DISCONTINUED] furosemide (LASIX) 40 MG tablet Take 1 tablet (40 mg total) by mouth daily. Take additional dose as needed for egt gain> 3 lb in 1 day   [DISCONTINUED] Insulin Disposable Pump (OMNIPOD DASH PODS, GEN 4,) MISC Inject 1 Dose into the skin continuous. Using pump - depends on carb intake.   [DISCONTINUED] traZODone (DESYREL) 50 MG tablet TAKE 0.5-1 TABLETS BY MOUTH AT BEDTIME AS NEEDED FOR SLEEP. (Patient taking differently: Take 50 mg by mouth at bedtime as needed for sleep.)   [DISCONTINUED] VICTOZA 18 MG/3ML SOPN INJECT 1.8 MG UNDER THE SKIN ONCE DAILY (Patient not taking: Reported on 11/13/2021)    Allergies  Allergen Reactions   Penicillins Hives    Childhood allergy Has patient had a PCN reaction  causing immediate rash, facial/tongue/throat swelling, SOB or lightheadedness with hypotension: Yes Has patient had a PCN reaction causing severe rash involving mucus membranes or skin necrosis: No Has patient had a PCN reaction that required hospitalization No Has patient had a PCN reaction occurring within the last 10 years: No If all of the above answers are "NO", then may proceed with Cephalosporin use.   Metformin And Related Other (See Comments)    Diarrhea, bleeding   Farxiga [Dapagliflozin] Other (See Comments)    Dizzy and lethargic     SOCIAL  HISTORY/FAMILY HISTORY   Reviewed in Epic:  Pertinent findings:  Social History   Tobacco Use   Smoking status: Former    Packs/day: 0.25    Years: 44.00    Pack years: 11.00    Types: Cigarettes    Quit date: 02/17/2015    Years since quitting: 6.8   Smokeless tobacco: Never  Vaping Use   Vaping Use: Never used  Substance Use Topics   Alcohol use: Not Currently    Comment: 07/03/2017 "might have a few drinks/year"   Drug use: No   Social History   Social History Narrative   Not on file    OBJCTIVE -PE, EKG, labs   Wt Readings from Last 3 Encounters:  11/23/21 246 lb 6.4 oz (111.8 kg)  11/15/21 244 lb 1.6 oz (110.7 kg)  10/27/21 248 lb (112.5 kg)    Physical Exam: BP 126/72    Pulse 85    Ht _0  (1.6 m)    Wt 248 lb (112.5 kg)    LMP  (LMP Unknown)    SpO2 96%    BMI 43.93 kg/m  Physical Exam Vitals reviewed.  Constitutional:      General: She is not in acute distress.    Comments: Healthy appearing.  Nontoxic.  HENT:     Head: Normocephalic and atraumatic.  Neck:     Vascular: No carotid bruit or JVD (Cannot assess).  Cardiovascular:     Rate and Rhythm: Normal rate and regular rhythm. Occasional Extrasystoles are present.    Chest Wall: PMI is not displaced (Difficult to palpate).     Pulses: Decreased pulses (Diminished pedal pulses but feet are warm.).     Heart sounds: S1 normal and S2 normal. Heart sounds are distant. Murmur heard.  Low-pitched rumbling crescendo presystolic murmur is present with a grade of 1/4 at the lower right sternal border and apex radiating to the apex.    No friction rub. No gallop.  Pulmonary:     Effort: No respiratory distress.     Breath sounds: No wheezing, rhonchi or rales.     Comments: Based on accessory muscle use.  Nonlabored.  Mild interstitial lung sounds but no obvious wheezes rales or rhonchi. Chest:     Chest wall: Tenderness present.  Musculoskeletal:        General: Swelling (Trivial ankle swelling; but has a  hand swelling as well.  Also both knees swelling) present.     Cervical back: Normal range of motion and neck supple.  Neurological:     General: No focal deficit present.     Mental Status: She is alert and oriented to person, place, and time.     Gait: Gait abnormal.  Psychiatric:        Mood and Affect: Mood normal.        Behavior: Behavior normal.        Thought Content: Thought content normal.  Judgment: Judgment normal.   Adult ECG Report  Rate: 85;  Rhythm: normal sinus rhythm; normal axis, intervals and durations.  Low voltage.  Narrative Interpretation: Stable  Recent Labs: Reviewed Lab Results  Component Value Date   CHOL 97 08/22/2020   HDL 38.80 (L) 08/22/2020   LDLCALC 44 08/22/2020   LDLDIRECT 131 (H) 09/02/2013   TRIG 74.0 08/22/2020   CHOLHDL 3 08/22/2020  10/24/2021: Cr 1.4, K + 3.9; Hgb 9.6  ==================================================  COVID-19 Education: The signs and symptoms of COVID-19 were discussed with the patient and how to seek care for testing (follow up with PCP or arrange E-visit).    I spent a total of 29 minutes with the patient spent in direct patient consultation.  Additional time spent with chart review  / charting (studies, outside notes, etc): 18 min Total Time: 47 min  Current medicines are reviewed at length with the patient today.  (+/- concerns) N/A  This visit occurred during the SARS-CoV-2 public health emergency.  Safety protocols were in place, including screening questions prior to the visit, additional usage of staff PPE, and extensive cleaning of exam room while observing appropriate contact time as indicated for disinfecting solutions.  Notice: This dictation was prepared with Dragon dictation along with smart phrase technology. Any transcriptional errors that result from this process are unintentional and may not be corrected upon review.  Studies Ordered:   Orders Placed This Encounter  Procedures   EKG  12-Lead   ECHOCARDIOGRAM COMPLETE    Patient Instructions / Medication Changes & Studies & Tests Ordered   Patient Instructions  Medication Instructions:     Decrease Lasix ( furosemide)  to 20 mg ( 1/2/tablet)  , with 20 mg  you may take a an additional 20 mg if need for swelling or weight gain more than 3 lbs overnight or 5 lbs in one week -- if you have continue to take an additional 20 mg , go back to 40 mg    *If you need a refill on your cardiac medications before your next appointment, please call your pharmacy*   Lab Work:  Not needed   Testing/Procedures: Will be schedule in 6 months ( July 2023) at [facility] has requested that you have an echocardiogram. Echocardiography is a painless test that uses sound waves to create images of your heart. It provides your doctor with information about the size and shape of your heart and how well your hearts chambers and valves are working. This procedure takes approximately one hour. There are no restrictions for this procedure.    Follow-Up: At [facility], you and your health needs are our priority.  As part of our continuing mission to provide you with exceptional heart care, we have created designated Provider Care Teams.  These Care Teams include your primary Cardiologist (physician) and Advanced Practice Providers (APPs -  Physician Assistants and Nurse Practitioners) who all work together to provide you with the care you need, when you need it.     Your next appointment:   6 month(s) July 2023  The format for your next appointment:   In Person  Provider:   Glenetta Hew, MD    Other Instructions      Glenetta Hew, M.D., M.S. Interventional Cardiologist   Pager # [phone] Phone # [phone] [address] [place], [place] [address]   Thank you for choosing Heartcare at [facility]!!

## 2021-10-27 NOTE — Patient Instructions (Signed)
Medication Instructions:     Decrease Lasix ( furosemide)  to 20 mg ( 1/2/tablet)  , with 20 mg  you may take a an additional 20 mg if need for swelling or weight gain more than 3 lbs overnight or 5 lbs in one week -- if you have continue to take an additional 20 mg , go back to 40 mg    *If you need a refill on your cardiac medications before your next appointment, please call your pharmacy*   Lab Work:  Not needed   Testing/Procedures: Will be schedule in 6 months ( July 2023) at [place] has requested that you have an echocardiogram. Echocardiography is a painless test that uses sound waves to create images of your heart. It provides your doctor with information about the size and shape of your heart and how well your hearts chambers and valves are working. This procedure takes approximately one hour. There are no restrictions for this procedure.    Follow-Up: At [facility], you and your health needs are our priority.  As part of our continuing mission to provide you with exceptional heart care, we have created designated Provider Care Teams.  These Care Teams include your primary Cardiologist (physician) and Advanced Practice Providers (APPs -  Physician Assistants and Nurse Practitioners) who all work together to provide you with the care you need, when you need it.     Your next appointment:   6 month(s) July 2023  The format for your next appointment:   In Person  Provider:   Glenetta Hew, MD    Other Instructions

## 2021-10-31 ENCOUNTER — Encounter: Payer: Self-pay | Admitting: Family Medicine

## 2021-10-31 DIAGNOSIS — Z1211 Encounter for screening for malignant neoplasm of colon: Secondary | ICD-10-CM

## 2021-11-13 ENCOUNTER — Observation Stay (HOSPITAL_COMMUNITY)
Admission: EM | Admit: 2021-11-13 | Discharge: 2021-11-15 | Disposition: A | Discharge status: Home / Self Care | Payer: Medicare Other | Attending: Family Medicine | Admitting: Family Medicine

## 2021-11-13 ENCOUNTER — Encounter (HOSPITAL_COMMUNITY): Payer: Self-pay

## 2021-11-13 ENCOUNTER — Other Ambulatory Visit: Payer: Self-pay

## 2021-11-13 DIAGNOSIS — R0600 Dyspnea, unspecified: Secondary | ICD-10-CM

## 2021-11-13 DIAGNOSIS — D649 Anemia, unspecified: Secondary | ICD-10-CM | POA: Diagnosis present

## 2021-11-13 DIAGNOSIS — I10 Essential (primary) hypertension: Secondary | ICD-10-CM | POA: Diagnosis present

## 2021-11-13 DIAGNOSIS — E1165 Type 2 diabetes mellitus with hyperglycemia: Secondary | ICD-10-CM | POA: Diagnosis not present

## 2021-11-13 DIAGNOSIS — Z794 Long term (current) use of insulin: Secondary | ICD-10-CM | POA: Diagnosis not present

## 2021-11-13 DIAGNOSIS — I2510 Atherosclerotic heart disease of native coronary artery without angina pectoris: Secondary | ICD-10-CM | POA: Diagnosis not present

## 2021-11-13 DIAGNOSIS — Z96653 Presence of artificial knee joint, bilateral: Secondary | ICD-10-CM | POA: Diagnosis not present

## 2021-11-13 DIAGNOSIS — Z8673 Personal history of transient ischemic attack (TIA), and cerebral infarction without residual deficits: Secondary | ICD-10-CM | POA: Insufficient documentation

## 2021-11-13 DIAGNOSIS — J9621 Acute and chronic respiratory failure with hypoxia: Secondary | ICD-10-CM | POA: Diagnosis not present

## 2021-11-13 DIAGNOSIS — R231 Pallor: Secondary | ICD-10-CM | POA: Diagnosis not present

## 2021-11-13 DIAGNOSIS — I480 Paroxysmal atrial fibrillation: Secondary | ICD-10-CM | POA: Diagnosis present

## 2021-11-13 DIAGNOSIS — I050 Rheumatic mitral stenosis: Secondary | ICD-10-CM | POA: Diagnosis not present

## 2021-11-13 DIAGNOSIS — E860 Dehydration: Secondary | ICD-10-CM | POA: Diagnosis not present

## 2021-11-13 DIAGNOSIS — Z79899 Other long term (current) drug therapy: Secondary | ICD-10-CM | POA: Insufficient documentation

## 2021-11-13 DIAGNOSIS — I499 Cardiac arrhythmia, unspecified: Secondary | ICD-10-CM | POA: Diagnosis not present

## 2021-11-13 DIAGNOSIS — R55 Syncope and collapse: Principal | ICD-10-CM | POA: Diagnosis present

## 2021-11-13 DIAGNOSIS — R6889 Other general symptoms and signs: Secondary | ICD-10-CM | POA: Diagnosis not present

## 2021-11-13 DIAGNOSIS — Z20822 Contact with and (suspected) exposure to covid-19: Secondary | ICD-10-CM | POA: Insufficient documentation

## 2021-11-13 DIAGNOSIS — Z7901 Long term (current) use of anticoagulants: Secondary | ICD-10-CM | POA: Insufficient documentation

## 2021-11-13 DIAGNOSIS — J449 Chronic obstructive pulmonary disease, unspecified: Secondary | ICD-10-CM | POA: Diagnosis not present

## 2021-11-13 DIAGNOSIS — I110 Hypertensive heart disease with heart failure: Secondary | ICD-10-CM | POA: Diagnosis not present

## 2021-11-13 DIAGNOSIS — N179 Acute kidney failure, unspecified: Secondary | ICD-10-CM | POA: Insufficient documentation

## 2021-11-13 DIAGNOSIS — Z743 Need for continuous supervision: Secondary | ICD-10-CM | POA: Diagnosis not present

## 2021-11-13 DIAGNOSIS — E1169 Type 2 diabetes mellitus with other specified complication: Secondary | ICD-10-CM

## 2021-11-13 DIAGNOSIS — I5032 Chronic diastolic (congestive) heart failure: Secondary | ICD-10-CM | POA: Diagnosis present

## 2021-11-13 DIAGNOSIS — R404 Transient alteration of awareness: Secondary | ICD-10-CM | POA: Diagnosis not present

## 2021-11-13 HISTORY — DX: Rheumatoid arthritis, unspecified: M06.9

## 2021-11-13 LAB — COMPREHENSIVE METABOLIC PANEL
ALT: 17 U/L (ref 0–44)
AST: 19 U/L (ref 15–41)
Albumin: 4.1 g/dL (ref 3.5–5.0)
Alkaline Phosphatase: 85 U/L (ref 38–126)
Anion gap: 12 (ref 5–15)
BUN: 33 mg/dL — ABNORMAL HIGH (ref 8–23)
CO2: 30 mmol/L (ref 22–32)
Calcium: 9.1 mg/dL (ref 8.9–10.3)
Chloride: 91 mmol/L — ABNORMAL LOW (ref 98–111)
Creatinine, Ser: 1.86 mg/dL — ABNORMAL HIGH (ref 0.44–1.00)
GFR, Estimated: 29 mL/min — ABNORMAL LOW (ref >60)
Glucose, Bld: 230 mg/dL — ABNORMAL HIGH (ref 70–99)
Potassium: 3.5 mmol/L (ref 3.5–5.1)
Sodium: 133 mmol/L — ABNORMAL LOW (ref 135–145)
Total Bilirubin: 0.5 mg/dL (ref 0.3–1.2)
Total Protein: 8.2 g/dL — ABNORMAL HIGH (ref 6.5–8.1)

## 2021-11-13 LAB — CBC WITH DIFFERENTIAL/PLATELET
Abs Immature Granulocytes: 0.06 10*3/uL (ref 0.00–0.07)
Basophils Absolute: 0.1 10*3/uL (ref 0.0–0.1)
Basophils Relative: 1 %
Eosinophils Absolute: 0.1 10*3/uL (ref 0.0–0.5)
Eosinophils Relative: 0 %
HCT: 32.9 % — ABNORMAL LOW (ref 36.0–46.0)
Hemoglobin: 10.1 g/dL — ABNORMAL LOW (ref 12.0–15.0)
Immature Granulocytes: 1 %
Lymphocytes Relative: 9 %
Lymphs Abs: 1.1 10*3/uL (ref 0.7–4.0)
MCH: 18.9 pg — ABNORMAL LOW (ref 26.0–34.0)
MCHC: 30.7 g/dL (ref 30.0–36.0)
MCV: 61.6 fL — ABNORMAL LOW (ref 80.0–100.0)
Monocytes Absolute: 1 10*3/uL (ref 0.1–1.0)
Monocytes Relative: 8 %
Neutro Abs: 10.3 10*3/uL — ABNORMAL HIGH (ref 1.7–7.7)
Neutrophils Relative %: 81 %
Platelets: 266 10*3/uL (ref 150–400)
RBC: 5.34 MIL/uL — ABNORMAL HIGH (ref 3.87–5.11)
RDW: 17.5 % — ABNORMAL HIGH (ref 11.5–15.5)
WBC: 12.6 10*3/uL — ABNORMAL HIGH (ref 4.0–10.5)
nRBC: 0.2 % (ref 0.0–0.2)

## 2021-11-13 LAB — TROPONIN I (HIGH SENSITIVITY): Troponin I (High Sensitivity): 21 ng/L — ABNORMAL HIGH (ref <18)

## 2021-11-13 LAB — RESP PANEL BY RT-PCR (FLU A&B, COVID) ARPGX2
Influenza A by PCR: NEGATIVE
Influenza B by PCR: NEGATIVE
SARS Coronavirus 2 by RT PCR: NEGATIVE

## 2021-11-13 LAB — BRAIN NATRIURETIC PEPTIDE: B Natriuretic Peptide: 90.8 pg/mL (ref 0.0–100.0)

## 2021-11-13 MED ORDER — SALINE SPRAY 0.65 % NA SOLN
1.0000 | Freq: Once | NASAL | Status: AC
  Administered 2021-11-13: 1 via NASAL
  Filled 2021-11-13: qty 44

## 2021-11-13 MED ORDER — SODIUM CHLORIDE 0.9% FLUSH
3.0000 mL | Freq: Two times a day (BID) | INTRAVENOUS | Status: DC
  Administered 2021-11-13 – 2021-11-14 (×2): 3 mL via INTRAVENOUS

## 2021-11-13 MED ORDER — LACTATED RINGERS IV BOLUS
1000.0000 mL | Freq: Once | INTRAVENOUS | Status: AC
  Administered 2021-11-13: 1000 mL via INTRAVENOUS

## 2021-11-13 NOTE — H&P (Signed)
History and Physical    Lynn [name]:[id number] DOB: 12-Mar-1956 DOA: 11/13/2021  PCP: [name], MD  Patient coming from: Home  I have personally briefly reviewed patient's old medical records in [place]  Chief Complaint: dizziness with loss of consciousness  HPI: Lynn [name] is a 66 y.o. female with medical history significant for CVA, hypertension, CAD, paroxysmal atrial fibrillation on Eliquis, chronic diastolic heart failure with mitral valve stenosis and calcified mitral valve mass, insulin-dependent, type 2 diabetes, RA, COPD and ILD on 3 L who presents with concerns of syncope.  For the past week and a half, she has had episodes where she would break out in a sweat with a headache and have nausea and vomiting.  Today she was cooking when she suddenly became diaphoretic, felt pain between her shoulder blades and dizzy.  She called out to her husband who caught her as she was losing consciousness.  Had vomiting after she regained consciousness.   Also notes worsening shortness of breath the past week requiring her to increase her baseline oxygen of 3 L to 4 L. She reports having chest pain but states is chronic and has been told that it might be due to her arthritis.  Reports having palpitations but states that is also chronic.  Has lower extremity edema that is no worse than usual.  Notes lower abdominal pain for the past month but has issues with constipation.  She was recently seen by cardiology and started on Eliquis for atrial fibrillation.  Denies any melena.  She was recently advised to decrease her Lasix if she had any changes in her weight but states she has been taking her regular dose since she has not noticed any weight loss.  Also on HCTZ.    ED Course:  She was afebrile, initially hypertensive up to 164/101 now on 4 L via nasal cannula.  Leukocytosis of 12.6, hemoglobin of 10.1 which is around her baseline.  Sodium 133, K of  3.5, creatinine of 1.86 from prior of 1.4.  ED physician discussed with cardiology and they recommend obtaining repeat Echo. Hospitalist called to admit for syncope workup.   Review of Systems:  Pertinent negatives and positives stated above in the HPI  Past Medical History:  Diagnosis Date   Anxiety    Arthritis    "knees, wrists, back, elbows" (07/03/2017)   Clotting disorder (HCC)    blood clot in eye 2016   Coronary artery disease, non-occlusive    a. 11/2016 NSTEMI/Cath: LM nl, LAD 40p, 52m, D1/2 small, LCX 40ost, OM2/3 nl/small, RCA 35 ost/mid, RPDA/RPL small/nl.   Coronary vasospasm (HCC)    Depression    Diastolic dysfunction    a. 11/2016 Echo: EF 55-60%, gr1 DD, sev calcified MV annulus, mildly dil LA.   Eye hemorrhage 01/2015   "right; resolved" (07/03/2017)   Headache    "monthly" (07/03/2017)   History of blood transfusion    "when I had an ectopic pregnancy"   History of stomach ulcers    Hyperlipidemia    Hypertension    Microcytic anemia    Moderate mitral stenosis by prior echocardiogram 03/2021   TTE: Mod MS (mean gradient 9 mmHg). -- TEE Moderate-Severe MS (mean gradient 14 mmHg) with fixed Post MV Leaflet & Severe MAC w/ large circumscribed calcified mass on the Post MV Leaflet. (Mass previously described in 2018) - CMRI identifies calcified mass & not Tumor.; R&LHC - LVEDP & PCWP both 23 mmHg indicating minimal resting  gradient   OSA on CPAP    setting is unknown   PAF (paroxysmal atrial fibrillation) ([place]) 03/2021   Event Monitor Aug-Sept 2022: Predominantly sinus rhythm.  1% A. fib burden.  30 brief episodes of PAT.  2 short bursts of NSVT.   PAT (paroxysmal atrial tachycardia) ([place])    Event monitor from August 2022 showed 30 brief bursts longest 14 seconds.   Pneumonia    "several times" (07/03/2017)   PVC's (premature ventricular contractions)    Syncope 07/03/2017 X 2   called seizures but no medications   Thalassemia    "my cells are sickle cell  shaped but I don't have sickle cell anemia" (07/03/2017)   Type II diabetes mellitus ([place])     Past Surgical History:  Procedure Laterality Date   48-Hour Monitor  03/18/2017   Sinus rhythm with sinus tachycardia (rate 58-134 BPM)multiple PVCs noted with couplets and bigeminy. One triplet. 7 runs of PAT ranging from 100-130 bpm. Longest was 33 beats.   BIOPSY  04/03/2021   Procedure: BIOPSY;  Surgeon: Yetta Flock, MD;  Location: [facility];  Service: Gastroenterology;;   BREAST BIOPSY Left    BRONCHIAL WASHINGS  03/28/2021   Procedure: BRONCHIAL WASHINGS;  Surgeon: Julian Hy, DO;  Location: [place] [place];  Service: Endoscopy;;   CARDIAC CATHETERIZATION N/A 11/19/2016   Procedure: Left Heart Cath and Coronary Angiography;  Surgeon: Belva Crome, MD;  Location: [facility];  Service: Cardiovascular.    LM nl, LAD 40p, 65m, D1/2 small, LCX 40ost, OM2/3 nl/small, RCA 35 ost/mid, RPDA/RPL small/nl.   CARDIAC MRI  03/30/2021   Normal LVEF ~53%. 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 No Scar or Late Gadolinium Enhancement on LV Myocardium.   CARPAL TUNNEL RELEASE Right 11/01/2014   COLONOSCOPY     DILATION AND CURETTAGE OF UTERUS     ECTOPIC PREGNANCY SURGERY  X 2   ESOPHAGOGASTRODUODENOSCOPY (EGD) WITH PROPOFOL N/A 04/03/2021   Procedure: ESOPHAGOGASTRODUODENOSCOPY (EGD) WITH PROPOFOL;  Surgeon: AYetta Flock MD;  Location: [facility];  Service: Gastroenterology;  Laterality: N/A;   ESOPHAGOGASTRODUODENOSCOPY (EGD) WITH PROPOFOL N/A 08/15/2021   Procedure: ESOPHAGOGASTRODUODENOSCOPY (EGD) WITH PROPOFOL;  Surgeon: SLadene Artist MD;  Location: [facility];  Service: Endoscopy;  Laterality: N/A;   JOINT REPLACEMENT     KNEE ARTHROSCOPY Left 11/2014   RIGHT/LEFT HEART CATH AND CORONARY ANGIOGRAPHY N/A 08/08/2021   Procedure: RIGHT/LEFT HEART CATH AND CORONARY ANGIOGRAPHY;  Surgeon: HLeonie Man MD;  [facility];  Service: Cardiovascular;Ost LCx 40%. Ost-Prox RCA 35%. Prox-Mid LAD 25%-<25% D2. Mod-Severely Elevated LVEDP. Mild PA HTN -> PA mean 31 mmHg with a PCWP and LVEDP of 23 mmHg   TEE WITHOUT CARDIOVERSION N/A 03/28/2021   Procedure: TRANSESOPHAGEAL ECHOCARDIOGRAM (TEE);  Surgeon: NThayer Headings MD;  [facility];; LVEF 55-60%. Normal LV Fxn & no RWMA. No LAA thrombus. Gr II Ao Plaque. Large circumscribed calcific mass (1.8 x 1.4 cm) anterior to posterior annulus.  Fixed posterior leaflet with Mod-Severe MS (Mean MVG ~14 mmHg, Peak 20 mmhg).  Mild MR   TONSILLECTOMY     TOTAL KNEE ARTHROPLASTY Right 02/18/2015   Procedure: RIGHT TOTAL KNEE ARTHROPLASTY;  Surgeon: SNetta Cedars MD;  [facility]  Service: [facility]ics;  Laterality: Right;   TOTAL KNEE ARTHROPLASTY Left 08/19/2015   Procedure: LEFT TOTAL KNEE ARTHROPLASTY;  Surgeon: SNetta Cedars MD;  [facility]  Service: Orthopedics;  Laterality: Left;   TRANSTHORACIC ECHOCARDIOGRAM  11/2016; 07/2017:   a) normal LV size, thickness and function. EF 55-60%. GR 1 DD.No RWMA severe mitral annular calcification, but no mitral stenosis. Mild LA dilation;; b) normal LV size and function.  EF 65-75%.  Unable to assess diastolic function.  Aortic sclerosis with no stenosis.  Moderate mitral stenosis? (Gradient 9 mmHg) -?  Mild-mod left atrial enlargement    TRANSTHORACIC ECHOCARDIOGRAM  01/2019   EF 65 to 70%.  Normal function.  Elevated LVEDP-GR 1 DD.  Normal RV size and function.  Large calcific mass on posterior mitral leaflet mild to moderate mitral stenosis.   TRANSTHORACIC ECHOCARDIOGRAM  03/24/2021   EF 60 to 65%.  LV function with normal wall motion.  Moderate basal septal LVH.  GRII DD & Mild LA dilation.-> elevated LVEDP.  Normal RV function.  Mildly elevated PAP.  Ill-defined density measuring 3.23 x 2.1 cm region of the posterior MV leaflet along with dense MAC.  Moderate MS (mean MVG of 9 mmHg.) Normal AoV & RAP.  --> Recommend TEE 2/2 concern for MV SBE.   VAGINAL HYSTERECTOMY     VIDEO BRONCHOSCOPY N/A 03/28/2021   Procedure: VIDEO BRONCHOSCOPY WITHOUT FLUORO;  Surgeon: Julian Hy, DO;  Location: [facility];  Service: Endoscopy;  Laterality: N/A;     reports that she quit smoking about 6 years ago. Her smoking use included cigarettes. She has a 11.00 pack-year smoking history. She has never used smokeless tobacco. She reports that she does not currently use alcohol. She reports that she does not use drugs. Social History  Allergies  Allergen Reactions   Penicillins Hives    Childhood allergy Has patient had a PCN reaction causing immediate rash, facial/tongue/throat swelling, SOB or lightheadedness with hypotension: Yes Has patient had a PCN reaction causing severe rash involving mucus membranes or skin necrosis: No Has patient had a PCN reaction that required hospitalization No Has patient had a PCN reaction occurring within the last 10 years: No If all of the above answers are "NO", then may proceed with Cephalosporin use.   Metformin And Related Other (See Comments)    Diarrhea, bleeding   Farxiga [Dapagliflozin] Other (See Comments)    Dizzy and lethargic     Family History  Problem Relation Age of Onset   Cancer Mother    Diabetes Mother    Hypertension Mother    Hyperlipidemia Mother    Cancer Father    Hyperlipidemia Father    Mental illness Sister    Diabetes Sister    Diabetes Maternal Grandmother    Diabetes Maternal Grandfather    Colon cancer Neg Hx    Esophageal cancer Neg Hx    Stomach cancer Neg Hx    Rectal cancer Neg Hx      Prior to Admission medications   Medication Sig Start Date End Date Taking? Authorizing Provider  acetaminophen (TYLENOL) 500 MG tablet Take 500 mg by mouth every 6 (six) hours as needed for fever.   Yes [provider]  acetaminophen-codeine (TYLENOL #3) 300-30 MG tablet Take 1-2 tablets by mouth every 8 (eight) hours as  needed for moderate pain. 07/19/21  Yes Copland, Gay Filler, MD  albuterol (VENTOLIN HFA) 108 (90 Base) MCG/ACT inhaler Inhale 2 puffs into the lungs every 6 (six) hours as needed for wheezing or shortness of breath. 04/03/21  Yes Gonfa, Charlesetta Ivory, MD  apixaban (ELIQUIS) 5 MG TABS tablet Take 1 tablet (5 mg total) by mouth 2 (  two) times daily. 10/27/21  Yes Leonie Man, MD  atorvastatin (LIPITOR) 80 MG tablet TAKE 1 TABLET(80 MG) BY MOUTH DAILY AT 6 PM Patient taking differently: Take 80 mg by mouth every evening. 02/20/21  Yes Copland, Gay Filler, MD  cetirizine (ZYRTEC) 10 MG tablet Take 10 mg by mouth daily as needed for allergies.   Yes [provider]  diltiazem (CARDIZEM CD) 360 MG 24 hr capsule Take 1 capsule (360 mg total) by mouth daily. 07/05/21  Yes Leonie Man, MD  folic acid (FOLVITE) 1 MG tablet Take 2 tablets (2 mg total) by mouth daily. Patient taking differently: Take 1 mg by mouth in the morning and at bedtime. 01/21/20  Yes Copland, Gay Filler, MD  furosemide (LASIX) 40 MG tablet Decrease Lasix ( furosemide)  to 20 mg ( 1/2/tablet)  , with 20 mg  you may take a an additional 20 mg if need for swelling or weight gain more than 3 lbs overnight or 5 lbs in one week -- if you have continue to take an additional 20 mg , go back to 40 mg  Take additional dose as needed for egt gain> 3 lb in 1 day Patient taking differently: Take 40 mg by mouth daily. Decrease Lasix ( furosemide)  to 20 mg ( 1/2/tablet)  , with 20 mg  you may take a an additional 20 mg if need for swelling or weight gain more than 3 lbs overnight or 5 lbs in one week -- if you have continue to take an additional 20 mg , go back to 40 mg  Take additional dose as needed for egt gain> 3 lb in 1 day 10/27/21  Yes Leonie Man, MD  hydrochlorothiazide (HYDRODIURIL) 12.5 MG tablet Take 12.5 mg by mouth daily. 07/29/21  Yes [provider]  insulin lispro (HUMALOG) 100 UNIT/ML injection Max Daily 100 units per pump  09/11/21  Yes [name], Melanie Crazier, MD  leflunomide (ARAVA) 20 MG tablet Take 20 mg by mouth daily. 08/16/21  Yes [provider]  losartan (COZAAR) 50 MG tablet Take 1 tablet (50 mg total) by mouth daily. 07/05/21 11/13/21 Yes Leonie Man, MD  Melatonin 5 MG CHEW Chew 5 mg by mouth at bedtime.   Yes [provider]  Menthol-Methyl Salicylate (SALONPAS PAIN RELIEF PATCH) PTCH Apply 1 patch topically daily as needed (pain).   Yes [provider]  Multiple Vitamin (MULTIVITAMIN WITH MINERALS) TABS tablet Take 1 tablet by mouth daily.   Yes [provider]  Oxymetazoline HCl (VICKS SINEX NA) Place 1 spray into the nose daily as needed (congestion).   Yes [provider]  pantoprazole (PROTONIX) 40 MG tablet Take 1 tablet (40 mg total) by mouth 2 (two) times daily before a meal. 07/07/21  Yes Ladene Artist, MD  predniSONE (DELTASONE) 5 MG tablet Take 5 mg by mouth daily. 08/16/21  Yes [provider]  traZODone (DESYREL) 50 MG tablet TAKE 0.5-1 TABLETS BY MOUTH AT BEDTIME AS NEEDED FOR SLEEP. Patient taking differently: Take 50 mg by mouth at bedtime as needed for sleep. 05/02/21  Yes Copland, Gay Filler, MD  Continuous Blood Gluc Sensor (DEXCOM G6 SENSOR) MISC 1 Device by Does not apply route as directed. 09/11/21   [name], Melanie Crazier, MD  Continuous Blood Gluc Transmit (DEXCOM G6 TRANSMITTER) MISC 1 Device by Does not apply route as directed. 09/11/21   [name], Melanie Crazier, MD  glucose blood (CONTOUR NEXT TEST) test strip 3x daily  07/20/19   [name], Melanie Crazier, MD  Insulin Disposable Pump (OMNIPOD DASH PODS, GEN 4,) MISC Inject 1 Dose into the skin continuous. Using pump - depends on carb intake. 10/03/20   [provider]  Insulin Pen Needle (B-D UF III MINI PEN NEEDLES) 31G X 5 MM MISC USE DAILY WITH VICTOZA AND LANTUS. 07/20/19   [name], Melanie Crazier, MD  Insulin Pen Needle 31G X 5 MM MISC 1 Device  by Does not apply route 3 (three) times daily. 01/21/20   Copland, Gay Filler, MD  Lancets 30G MISC 1 Device by Does not apply route 3 (three) times daily. 07/16/19   Copland, Gay Filler, MD  SAFETY-LOK TB SYRINGE 27GX.5" 27G X 1/2" 1 ML MISC  08/18/19   [provider]  Semaglutide, 2 MG/DOSE, 8 MG/3ML SOPN Inject 2 mg as directed once a week. 09/11/21   [name], Melanie Crazier, MD  VICTOZA 18 MG/3ML SOPN INJECT 1.8 MG UNDER THE SKIN ONCE DAILY Patient not taking: Reported on 11/13/2021 02/20/21   Copland, Gay Filler, MD    Physical Exam: Vitals:   11/13/21 1900 11/13/21 1913 11/13/21 1930 11/13/21 2045  BP:  (!) 164/101 132/65   Pulse:  77 76 73  Resp:  _0 Temp:  (!) 97.5 F (36.4 C)    TempSrc:  Oral    SpO2:  100% 100% 100%  Weight: 112.5 kg     Height: _1  (1.6 m)       Constitutional: NAD, calm, comfortable, chronically ill-appearing female sitting upright in bed Vitals:   11/13/21 1900 11/13/21 1913 11/13/21 1930 11/13/21 2045  BP:  (!) 164/101 132/65   Pulse:  77 76 73  Resp:  _2 Temp:  (!) 97.5 F (36.4 C)    TempSrc:  Oral    SpO2:  100% 100% 100%  Weight: 112.5 kg     Height: _3  (1.6 m)      Eyes:  lids and conjunctivae normal ENMT: Mucous membranes are moist.  Neck: normal, supple Respiratory: clear to auscultation bilaterally, no wheezing, no crackles. Normal respiratory effort on 4L. No accessory muscle use.  Cardiovascular: Regular rate and rhythm, no murmurs / rubs / gallops. +2 distal pretibial region of the lower extremity Abdomen: no tenderness,  Bowel sounds positive.  Musculoskeletal: no clubbing / cyanosis. No joint deformity upper and lower extremities.  Normal muscle tone.  Skin:patches of skin color lost  Neurologic: CN 2-12 grossly intact. Strength 5/5 in all 4.  Psychiatric: Normal judgment and insight. Alert and oriented x 3. Normal mood.     Labs on Admission: I have personally reviewed following labs and imaging  studies  CBC: Recent Labs  Lab 11/13/21 1924  WBC 12.6*  NEUTROABS 10.3*  HGB 10.1*  HCT 32.9*  MCV 61.6*  PLT 482   Basic Metabolic Panel: Recent Labs  Lab 11/13/21 1924  NA 133*  K 3.5  CL 91*  CO2 30  GLUCOSE 230*  BUN 33*  CREATININE 1.86*  CALCIUM 9.1   GFR: Estimated Creatinine Clearance: 35.4 mL/min (A) (by C-G formula based on SCr of 1.86 mg/dL (H)). Liver Function Tests: Recent Labs  Lab 11/13/21 1924  AST 19  ALT 17  ALKPHOS 85  BILITOT 0.5  PROT 8.2*  ALBUMIN 4.1   No results for input(s): LIPASE, AMYLASE in the last 168 hours. No results for input(s): AMMONIA in the last 168 hours. Coagulation Profile: No results for input(s): INR, PROTIME  in the last 168 hours. Cardiac Enzymes: No results for input(s): CKTOTAL, CKMB, CKMBINDEX, TROPONINI in the last 168 hours. BNP (last 3 results) Recent Labs    03/20/21 1220  PROBNP 57.0   HbA1C: No results for input(s): HGBA1C in the last 72 hours. CBG: No results for input(s): GLUCAP in the last 168 hours. Lipid Profile: No results for input(s): CHOL, HDL, LDLCALC, TRIG, CHOLHDL, LDLDIRECT in the last 72 hours. Thyroid Function Tests: No results for input(s): TSH, T4TOTAL, FREET4, T3FREE, THYROIDAB in the last 72 hours. Anemia Panel: No results for input(s): VITAMINB12, FOLATE, FERRITIN, TIBC, IRON, RETICCTPCT in the last 72 hours. Urine analysis:    Component Value Date/Time   COLORURINE YELLOW 03/23/2021 1732   APPEARANCEUR HAZY (A) 03/23/2021 1732   LABSPEC 1.017 03/23/2021 1732   PHURINE 5.0 03/23/2021 1732   GLUCOSEU NEGATIVE 03/23/2021 1732   HGBUR SMALL (A) 03/23/2021 1732   BILIRUBINUR NEGATIVE 03/23/2021 1732   KETONESUR NEGATIVE 03/23/2021 1732   PROTEINUR NEGATIVE 03/23/2021 1732   UROBILINOGEN 0.2 08/18/2008 1535   NITRITE NEGATIVE 03/23/2021 1732   LEUKOCYTESUR TRACE (A) 03/23/2021 1732    Radiological Exams on Admission: No results found.    Assessment/Plan  Syncope  with LOC -unclear etiology. Possible over diuresis but BP is elevated here. -has been getting recurrent episodes of diaphoresis with headache and nausea/vomiting. Also chronic palpitation symptoms.  -repeat Echocardiogram- has hx of severe mitral valve stenosis with calcified mass.  -keep on telemetry -check orthostatic vital signs, TSH  Acute on chronic hypoxic respiratory failure w/hx of COPD and ILD -baseline on 3L and now on 4L  -CXR negative. Not in acute exacerbation of COPD or ILD.  Follow echo   AKI -Creatinine of 1.86 up from 1.4 -received 1L of LR bolus. Follow with repeat tomorrow   Chronic diastolic heart failure Appears compensated.  Recommend resuming Lasix at a lower dose once AKI resolves  Chronic anemia stable at Hgb of 10  Paroxysmal atrial fibrillation Continue Eliquis Continue diltiazem  Hypertension resume Lasix at lower dose, HCTZ and ACE after resolution of AKI  Rheumatoid arthritis Continue leflunomide and chronic prednisone  History of CVA on Eliquis  Type 2 DM normally on insulin pump SSI  -place on resistant SSI q4hr  DVT prophylaxis:.Lovenox Code Status: Full Family Communication: Plan discussed with patient at bedside  disposition Plan: Home with observation Consults called:  Admission status: Observation  Level of care: Telemetry  Status is: Observation The patient remains OBS appropriate and will d/c before 2 midnights.  Planned Discharge Destination: [facility] Tanieka Pownall DO [facility]   If 7PM-7AM, please contact night-coverage [email or web address]   11/13/2021, 11:12 PM

## 2021-11-13 NOTE — ED Provider Notes (Signed)
[facility] DEPT Provider Note  CSN: [id number] Arrival date & time: 11/13/21 1847  Chief Complaint(s) No chief complaint on file.  HPI Lynn [name] is a 66 y.o. female with PMH mitral stenosis, coronary vasospasm, HLD, HTN, paroxysmal A. fib on Eliquis who presents emergency department for evaluation of syncope.  Patient states that she was seen by her cardiologist 2 weeks ago and since then she has had 2 episodes of acute onset diaphoresis followed by syncope.  She states that there is no preceding trigger to these events and they occur at rest.  Patient recently had an increase in her diuretic in an attempt to better control her blood pressure and she feels this may be contributing.  She denies chest pain, shortness of breath, headache, fever or other systemic symptoms.  HPI  Past Medical History Past Medical History:  Diagnosis Date   Anxiety    Arthritis    "knees, wrists, back, elbows" (07/03/2017)   Clotting disorder ([place])    blood clot in eye 2016   Coronary artery disease, non-occlusive    a. 11/2016 NSTEMI/Cath: LM nl, LAD 40p, 56m, D1/2 small, LCX 40ost, OM2/3 nl/small, RCA 35 ost/mid, RPDA/RPL small/nl.   Coronary vasospasm (HCC)    Depression    Diastolic dysfunction    a. 11/2016 Echo: EF 55-60%, gr1 DD, sev calcified MV annulus, mildly dil LA.   Eye hemorrhage 01/2015   "right; resolved" (07/03/2017)   Headache    "monthly" (07/03/2017)   History of blood transfusion    "when I had an ectopic pregnancy"   History of stomach ulcers    Hyperlipidemia    Hypertension    Microcytic anemia    Moderate mitral stenosis by prior echocardiogram 03/2021   TTE: Mod MS (mean gradient 9 mmHg). -- TEE Moderate-Severe MS (mean gradient 14 mmHg) with fixed Post MV Leaflet & Severe MAC w/ large circumscribed calcified mass on the Post MV Leaflet. (Mass previously described in 2018) - CMRI identifies calcified mass & not Tumor.; R&LHC - LVEDP &  PCWP both 23 mmHg indicating minimal resting gradient   OSA on CPAP    setting is unknown   PAF (paroxysmal atrial fibrillation) ([place] 03/2021   Event Monitor Aug-Sept 2022: Predominantly sinus rhythm.  1% A. fib burden.  30 brief episodes of PAT.  2 short bursts of NSVT.   PAT (paroxysmal atrial tachycardia) ([place]    Event monitor from August 2022 showed 30 brief bursts longest 14 seconds.   Pneumonia    "several times" (07/03/2017)   PVC's (premature ventricular contractions)    Syncope 07/03/2017 X 2   called seizures but no medications   Thalassemia    "my cells are sickle cell shaped but I don't have sickle cell anemia" (07/03/2017)   Type II diabetes mellitus ([facility]    Patient Active Problem List   Diagnosis Date Noted   Acute gastric ulcer without hemorrhage or perforation    Ulcer of esophagus without bleeding    PAF (paroxysmal atrial fibrillation) ([facility]; CHA2DS2-VASc score 7 07/05/2021   Anemia    Acute gastric ulcer with hemorrhage    Occult GI bleeding    Melena    Preop respiratory exam    ILD (interstitial lung disease) (HCC)    SOB (shortness of breath) 03/24/2021   FUO (fever of unknown origin) 03/24/2021   Hypokalemia 03/24/2021   Generalized weakness 03/24/2021   Coronary artery spasm ([facility] 08/09/2020   Rheumatoid arthritis ([place] 11/09/2019  Toe pain, bilateral 05/26/2019   Type 2 diabetes mellitus with hyperglycemia, with long-term current use of insulin ([place]) 05/26/2019   Mobility impaired 04/01/2019   Morbid obesity due to excess calories ([place]) 12/03/2017   DOE (dyspnea on exertion) 12/02/2017   Moderate mitral stenosis by prior echocardiography 08/23/2017   Chronic diastolic CHF (congestive heart failure) ([place]) 08/12/2017   Chronic respiratory failure with hypoxia ([place]) 08/12/2017   OSA on CPAP 08/12/2017   COPD with acute bronchitis ([place]) 08/12/2017   Near syncope 07/03/2017   Anxiety 07/03/2017   Coronary artery disease, non-occlusive 12/08/2016    PAT (paroxysmal atrial tachycardia) ([place])    H/O total knee replacement 08/19/2015   Vision, loss, sudden 03/07/2015   S/P TKR (total knee replacement) using cement 02/18/2015   Thalassemia 09/02/2013   Hyperlipidemia associated with type 2 diabetes mellitus ([place]) 08/24/2012   Essential hypertension 07/22/2012   Home Medication(s) Prior to Admission medications   Medication Sig Start Date End Date Taking? Authorizing Provider  acetaminophen (TYLENOL) 500 MG tablet Take 500 mg by mouth every 6 (six) hours as needed for fever.    [provider]  acetaminophen-codeine (TYLENOL #3) 300-30 MG tablet Take 1-2 tablets by mouth every 8 (eight) hours as needed for moderate pain. 07/19/21   Copland, Gay Filler, MD  albuterol (VENTOLIN HFA) 108 (90 Base) MCG/ACT inhaler Inhale 2 puffs into the lungs every 6 (six) hours as needed for wheezing or shortness of breath. 04/03/21   Mercy Riding, MD  apixaban (ELIQUIS) 5 MG TABS tablet Take 1 tablet (5 mg total) by mouth 2 (two) times daily. 10/27/21   Leonie Man, MD  atorvastatin (LIPITOR) 80 MG tablet TAKE 1 TABLET(80 MG) BY MOUTH DAILY AT 6 PM 02/20/21   Copland, Gay Filler, MD  cetirizine (ZYRTEC) 10 MG tablet Take 10 mg by mouth daily as needed for allergies.    [provider]  Continuous Blood Gluc Sensor (DEXCOM G6 SENSOR) MISC 1 Device by Does not apply route as directed. 09/11/21   [name], Melanie Crazier, MD  Continuous Blood Gluc Transmit (DEXCOM G6 TRANSMITTER) MISC 1 Device by Does not apply route as directed. 09/11/21   [name], Melanie Crazier, MD  diltiazem (CARDIZEM CD) 360 MG 24 hr capsule Take 1 capsule (360 mg total) by mouth daily. 07/05/21   Leonie Man, MD  folic acid (FOLVITE) 1 MG tablet Take 2 tablets (2 mg total) by mouth daily. 01/21/20   Copland, Gay Filler, MD  furosemide (LASIX) 40 MG tablet Decrease Lasix ( furosemide)  to 20 mg ( 1/2/tablet)  , with 20 mg  you may take a an additional 20 mg if need for  swelling or weight gain more than 3 lbs overnight or 5 lbs in one week -- if you have continue to take an additional 20 mg , go back to 40 mg  Take additional dose as needed for egt gain> 3 lb in 1 day 10/27/21   Leonie Man, MD  glucose blood (CONTOUR NEXT TEST) test strip 3x daily 07/20/19   [name], Melanie Crazier, MD  Insulin Disposable Pump (OMNIPOD DASH PODS, GEN 4,) MISC Inject 1 Dose into the skin continuous. Using pump - depends on carb intake. 10/03/20   [provider]  insulin lispro (HUMALOG) 100 UNIT/ML injection Max Daily 100 units per pump 09/11/21   [name], Melanie Crazier, MD  Insulin Pen Needle (B-D UF III MINI PEN NEEDLES) 31G X 5 MM MISC USE DAILY WITH VICTOZA  AND LANTUS. 07/20/19   [name], Melanie Crazier, MD  Insulin Pen Needle 31G X 5 MM MISC 1 Device by Does not apply route 3 (three) times daily. 01/21/20   Copland, Gay Filler, MD  Lancets 30G MISC 1 Device by Does not apply route 3 (three) times daily. 07/16/19   Copland, Gay Filler, MD  leflunomide (ARAVA) 20 MG tablet Take 20 mg by mouth daily. 08/16/21   [provider]  losartan (COZAAR) 50 MG tablet Take 1 tablet (50 mg total) by mouth daily. 07/05/21 10/27/21  Leonie Man, MD  Melatonin 5 MG CHEW Chew 5 mg by mouth at bedtime.    [provider]  Menthol-Methyl Salicylate (SALONPAS PAIN RELIEF PATCH) Combine Apply 1 patch topically daily as needed (pain).    [provider]  Multiple Vitamin (MULTIVITAMIN WITH MINERALS) TABS tablet Take 1 tablet by mouth daily.    [provider]  Oxymetazoline HCl (VICKS SINEX NA) Place 1 spray into the nose daily as needed (congestion).    [provider]  pantoprazole (PROTONIX) 40 MG tablet Take 1 tablet (40 mg total) by mouth 2 (two) times daily before a meal. 07/07/21   Ladene Artist, MD  predniSONE (DELTASONE) 5 MG tablet Take 5 mg by mouth daily. 08/16/21   [provider]  SAFETY-LOK TB SYRINGE 27GX.5" 27G  X 1/2" 1 ML MISC  08/18/19   [provider]  Semaglutide, 2 MG/DOSE, 8 MG/3ML SOPN Inject 2 mg as directed once a week. 09/11/21   [name], Melanie Crazier, MD  traZODone (DESYREL) 50 MG tablet TAKE 0.5-1 TABLETS BY MOUTH AT BEDTIME AS NEEDED FOR SLEEP. 05/02/21   Copland, Gay Filler, MD  VICTOZA 18 MG/3ML SOPN INJECT 1.8 MG UNDER THE SKIN ONCE DAILY 02/20/21   Copland, Gay Filler, MD                                                                                                                                    Past Surgical History Past Surgical History:  Procedure Laterality Date   48-Hour Monitor  03/18/2017   Sinus rhythm with sinus tachycardia (rate 58-134 BPM)multiple PVCs noted with couplets and bigeminy. One triplet. 7 runs of PAT ranging from 100-130 bpm. Longest was 33 beats.   BIOPSY  04/03/2021   Procedure: BIOPSY;  Surgeon: Yetta Flock, MD;  Location: [facility];  Service: Gastroenterology;;   BREAST BIOPSY Left    BRONCHIAL WASHINGS  03/28/2021   Procedure: BRONCHIAL WASHINGS;  Surgeon: Julian Hy, DO;  Location: [facility];  Service: Endoscopy;;   CARDIAC CATHETERIZATION N/A 11/19/2016   Procedure: Left Heart Cath and Coronary Angiography;  Surgeon: Belva Crome, MD;  Location: [facility];  Service: Cardiovascular.    LM nl, LAD 40p, 46m, D1/2 small, LCX 40ost, OM2/3 nl/small, RCA 35 ost/mid, RPDA/RPL small/nl.   CARDIAC MRI  03/30/2021   Normal LVEF ~53%.  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 No Scar or Late Gadolinium Enhancement on LV Myocardium.   CARPAL TUNNEL RELEASE Right 11/01/2014   COLONOSCOPY     DILATION AND CURETTAGE OF UTERUS     ECTOPIC PREGNANCY SURGERY  X 2   ESOPHAGOGASTRODUODENOSCOPY (EGD) WITH PROPOFOL N/A 04/03/2021   Procedure: ESOPHAGOGASTRODUODENOSCOPY (EGD) WITH PROPOFOL;  Surgeon: Yetta Flock, MD;  Location: [facility];  Service: Gastroenterology;  Laterality:  N/A;   ESOPHAGOGASTRODUODENOSCOPY (EGD) WITH PROPOFOL N/A 08/15/2021   Procedure: ESOPHAGOGASTRODUODENOSCOPY (EGD) WITH PROPOFOL;  Surgeon: Ladene Artist, MD;  Location: [facility];  Service: Endoscopy;  Laterality: N/A;   JOINT REPLACEMENT     KNEE ARTHROSCOPY Left 11/2014   RIGHT/LEFT HEART CATH AND CORONARY ANGIOGRAPHY N/A 08/08/2021   Procedure: RIGHT/LEFT HEART CATH AND CORONARY ANGIOGRAPHY;  Surgeon: Leonie Man, MD;  Location: [facility];  Service: Cardiovascular;Ost LCx 40%. Ost-Prox RCA 35%. Prox-Mid LAD 25%-<25% D2. Mod-Severely Elevated LVEDP. Mild PA HTN -> PA mean 31 mmHg with a PCWP and LVEDP of 23 mmHg   TEE WITHOUT CARDIOVERSION N/A 03/28/2021   Procedure: TRANSESOPHAGEAL ECHOCARDIOGRAM (TEE);  Surgeon: Thayer Headings, MD;  Location: [facility];; LVEF 55-60%. Normal LV Fxn & no RWMA. No LAA thrombus. Gr II Ao Plaque. Large circumscribed calcific mass (1.8 x 1.4 cm) anterior to posterior annulus.  Fixed posterior leaflet with Mod-Severe MS (Mean MVG ~14 mmHg, Peak 20 mmhg).  Mild MR   TONSILLECTOMY     TOTAL KNEE ARTHROPLASTY Right 02/18/2015   Procedure: RIGHT TOTAL KNEE ARTHROPLASTY;  Surgeon: Netta Cedars, MD;  Location: [facility];  Service: Orthopedics;  Laterality: Right;   TOTAL KNEE ARTHROPLASTY Left 08/19/2015   Procedure: LEFT TOTAL KNEE ARTHROPLASTY;  Surgeon: Netta Cedars, MD;  Location: [facility];  Service: Orthopedics;  Laterality: Left;   TRANSTHORACIC ECHOCARDIOGRAM  11/2016; 07/2017:   a) normal LV size, thickness and function. EF 55-60%. GR 1 DD.No RWMA severe mitral annular calcification, but no mitral stenosis. Mild LA dilation;; b) normal LV size and function.  EF 65-75%.  Unable to assess diastolic function.  Aortic sclerosis with no stenosis.  Moderate mitral stenosis? (Gradient 9 mmHg) -?  Mild-mod left atrial enlargement    TRANSTHORACIC ECHOCARDIOGRAM  01/2019   EF 65 to 70%.  Normal function.  Elevated LVEDP-GR 1 DD.  Normal RV size and  function.  Large calcific mass on posterior mitral leaflet mild to moderate mitral stenosis.   TRANSTHORACIC ECHOCARDIOGRAM  03/24/2021   EF 60 to 65%.  LV function with normal wall motion.  Moderate basal septal LVH.  GRII DD & Mild LA dilation.-> elevated LVEDP.  Normal RV function.  Mildly elevated PAP.  Ill-defined density measuring 3.23 x 2.1 cm region of the posterior MV leaflet along with dense MAC.  Moderate MS (mean MVG of 9 mmHg.) Normal AoV & RAP. --> Recommend TEE 2/2 concern for MV SBE.   VAGINAL HYSTERECTOMY     VIDEO BRONCHOSCOPY N/A 03/28/2021   Procedure: VIDEO BRONCHOSCOPY WITHOUT FLUORO;  Surgeon: Julian Hy, DO;  Location: [facility];  Service: Endoscopy;  Laterality: N/A;   Family History Family History  Problem Relation Age of Onset   Cancer Mother    Diabetes Mother    Hypertension Mother    Hyperlipidemia Mother    Cancer Father    Hyperlipidemia Father    Mental illness Sister    Diabetes Sister    Diabetes Maternal Grandmother  Diabetes Maternal Grandfather    Colon cancer Neg Hx    Esophageal cancer Neg Hx    Stomach cancer Neg Hx    Rectal cancer Neg Hx     Social History Social History   Tobacco Use   Smoking status: Former    Packs/day: 0.25    Years: 44.00    Pack years: 11.00    Types: Cigarettes    Quit date: 02/17/2015    Years since quitting: 6.7   Smokeless tobacco: Never  Vaping Use   Vaping Use: Never used  Substance Use Topics   Alcohol use: Not Currently    Comment: 07/03/2017 "might have a few drinks/year"   Drug use: No   Allergies Penicillins, Metformin and related, and Farxiga [dapagliflozin]  Review of Systems Review of Systems  Neurological:  Positive for syncope.  Physical Exam Vital Signs  I have reviewed the triage vital signs BP (!) 164/101 (BP Location: Right Arm)    Pulse 77    Temp (!) 97.5 F (36.4 C) (Oral)    Resp 20    Ht _0  (1.6 m)    Wt 112.5 kg    LMP  (LMP Unknown)    SpO2 100%    BMI 43.93  kg/m   Physical Exam Vitals and nursing note reviewed.  Constitutional:      General: She is not in acute distress.    Appearance: She is well-developed.  HENT:     Head: Normocephalic and atraumatic.  Eyes:     Conjunctiva/sclera: Conjunctivae normal.  Cardiovascular:     Rate and Rhythm: Normal rate and regular rhythm.     Heart sounds: Murmur heard.  Pulmonary:     Effort: Pulmonary effort is normal. No respiratory distress.     Breath sounds: Normal breath sounds.  Abdominal:     Palpations: Abdomen is soft.     Tenderness: There is no abdominal tenderness.  Musculoskeletal:        General: No swelling.     Cervical back: Neck supple.  Skin:    General: Skin is warm and dry.     Capillary Refill: Capillary refill takes less than 2 seconds.  Neurological:     Mental Status: She is alert.  Psychiatric:        Mood and Affect: Mood normal.    ED Results and Treatments Labs (all labs ordered are listed, but only abnormal results are displayed) Labs Reviewed  CBC WITH DIFFERENTIAL/PLATELET  COMPREHENSIVE METABOLIC PANEL                                                                                                                          Radiology No results found.  Pertinent labs & imaging results that were available during my care of the patient were reviewed by me and considered in my medical decision making (see MDM for details).  Medications Ordered in ED Medications  lactated ringers bolus 1,000 mL (has no administration in  time range)                                                                                                                                     Procedures Procedures  (including critical care time)  Medical Decision Making / ED Course   This patient presents to the ED for concern of syncope, this involves an extensive number of treatment options, and is a complaint that carries with it a high risk of complications and morbidity.   The differential diagnosis includes cardiogenic syncope, vasovagal syncope, dehydration, PE, dissection  MDM: Patient seen the emergency department for evaluation of syncope.  Physical exam reveals a diastolic murmur consistent with her mitral stenosis but is otherwise unremarkable.  Laboratory evaluation with a mild leukocytosis to 12.6, hemoglobin 10.1 with an MCV of 61.6, BUN elevated to 33, creatinine 1.86 which is an elevation for this patient.  Troponin elevated to 21.  Likely type II NSTEMI in the setting of demand ischemia and dehydration.  ECG nonischemic.  Due to the patient's history of mitral stenosis and suspected possible cardiogenic syncope, cardiology was consulted who recommends medical admission and they will round on the patient tomorrow.  She will need a repeat echo.  I attempted to obtain a CT PE but the patient's GFR dropped to 29 in the setting of dehydration and thus she was fluid rehydrated and will require repeat GFR testing prior to receiving CT PE in the inpatient setting.  Suspect dehydration secondary to increased diuretic use in the setting of a stenotic heart as the source of her syncope.  Patient then admitted to the hospitalist for echo and cardiac eval.   Additional history obtained: -Additional history obtained from husband -External records from outside source obtained and reviewed including: Chart review including previous notes, labs, imaging, consultation notes   Lab Tests: -I ordered, reviewed, and interpreted labs.   The pertinent results include:   Labs Reviewed  CBC WITH DIFFERENTIAL/PLATELET  COMPREHENSIVE METABOLIC PANEL      EKG   EKG Interpretation  Date/Time:  Monday November 13 2021 19:01:37 EST Ventricular Rate:  77 PR Interval:  178 QRS Duration: 108 QT Interval:  424 QTC Calculation: 480 R Axis:   22 Text Interpretation: Sinus rhythm Low voltage, precordial leads Confirmed by Lyndon ([id number]) on 11/13/2021 9:12:27 PM         Medicines ordered and prescription drug management: Meds ordered this encounter  Medications   lactated ringers bolus 1,000 mL    -I have reviewed the patients home medicines and have made adjustments as needed  Critical interventions Rehydration   Consultations Obtained: I requested consultation with the cardiologist (Dr. Conley Canal),  and discussed lab and imaging findings as well as pertinent plan - they recommend: Medical admission, repeat echo and they will round on the patient tomorrow   Cardiac Monitoring: The patient was maintained on a cardiac monitor.  I personally viewed  and interpreted the cardiac monitored which showed an underlying rhythm of: Normal sinus rhythm  Social Determinants of Health:  Factors impacting patients care include: none   Reevaluation: After the interventions noted above, I reevaluated the patient and found that they have :improved  Co morbidities that complicate the patient evaluation  Past Medical History:  Diagnosis Date   Anxiety    Arthritis    "knees, wrists, back, elbows" (07/03/2017)   Clotting disorder ([place])    blood clot in eye 2016   Coronary artery disease, non-occlusive    a. 11/2016 NSTEMI/Cath: LM nl, LAD 40p, 23m, D1/2 small, LCX 40ost, OM2/3 nl/small, RCA 35 ost/mid, RPDA/RPL small/nl.   Coronary vasospasm (HCC)    Depression    Diastolic dysfunction    a. 11/2016 Echo: EF 55-60%, gr1 DD, sev calcified MV annulus, mildly dil LA.   Eye hemorrhage 01/2015   "right; resolved" (07/03/2017)   Headache    "monthly" (07/03/2017)   History of blood transfusion    "when I had an ectopic pregnancy"   History of stomach ulcers    Hyperlipidemia    Hypertension    Microcytic anemia    Moderate mitral stenosis by prior echocardiogram 03/2021   TTE: Mod MS (mean gradient 9 mmHg). -- TEE Moderate-Severe MS (mean gradient 14 mmHg) with fixed Post MV Leaflet & Severe MAC w/ large circumscribed calcified mass on the Post MV Leaflet.  (Mass previously described in 2018) - CMRI identifies calcified mass & not Tumor.; R&LHC - LVEDP & PCWP both 23 mmHg indicating minimal resting gradient   OSA on CPAP    setting is unknown   PAF (paroxysmal atrial fibrillation) ([place] 03/2021   Event Monitor Aug-Sept 2022: Predominantly sinus rhythm.  1% A. fib burden.  30 brief episodes of PAT.  2 short bursts of NSVT.   PAT (paroxysmal atrial tachycardia) ([place]    Event monitor from August 2022 showed 30 brief bursts longest 14 seconds.   Pneumonia    "several times" (07/03/2017)   PVC's (premature ventricular contractions)    Syncope 07/03/2017 X 2   called seizures but no medications   Thalassemia    "my cells are sickle cell shaped but I don't have sickle cell anemia" (07/03/2017)   Type II diabetes mellitus ([place]       Dispostion: I considered admission for this patient, and due to suspected cardiogenic syncope in the setting of a stenotic heart, patient admitted.     Final Clinical Impression(s) / ED Diagnoses Final diagnoses:  None     _0 @    KTeressa Lower MD 11/13/21 2114

## 2021-11-13 NOTE — ED Triage Notes (Signed)
Pt BIB EMS from home. Patient state she has been feeling dizzy for days but today was worse. Patient had a syncopal episode and wad lowered to the ground. Patient also c/o nausea, and headaches. Patient has a hx of strokes and is on 4L nasal cannula at home. No c/o chest pain. Was recently discharged from hospital.  Vital signs were: Intial BP was 70 systolic now 568/12 75-TZ 22-RR 98% on 4L 220-CBG

## 2021-11-14 ENCOUNTER — Observation Stay (HOSPITAL_COMMUNITY): Payer: Medicare Other

## 2021-11-14 ENCOUNTER — Observation Stay (HOSPITAL_BASED_OUTPATIENT_CLINIC_OR_DEPARTMENT_OTHER): Payer: Medicare Other

## 2021-11-14 ENCOUNTER — Encounter (HOSPITAL_COMMUNITY): Payer: Self-pay | Admitting: Family Medicine

## 2021-11-14 DIAGNOSIS — I050 Rheumatic mitral stenosis: Secondary | ICD-10-CM

## 2021-11-14 DIAGNOSIS — R55 Syncope and collapse: Secondary | ICD-10-CM | POA: Diagnosis not present

## 2021-11-14 DIAGNOSIS — J9621 Acute and chronic respiratory failure with hypoxia: Secondary | ICD-10-CM

## 2021-11-14 DIAGNOSIS — I5032 Chronic diastolic (congestive) heart failure: Secondary | ICD-10-CM

## 2021-11-14 DIAGNOSIS — N179 Acute kidney failure, unspecified: Secondary | ICD-10-CM

## 2021-11-14 DIAGNOSIS — I959 Hypotension, unspecified: Secondary | ICD-10-CM | POA: Diagnosis not present

## 2021-11-14 DIAGNOSIS — R0600 Dyspnea, unspecified: Secondary | ICD-10-CM | POA: Diagnosis not present

## 2021-11-14 LAB — CBG MONITORING, ED
Glucose-Capillary: 253 mg/dL — ABNORMAL HIGH (ref 70–99)
Glucose-Capillary: 197 mg/dL — ABNORMAL HIGH (ref 70–99)
Glucose-Capillary: 312 mg/dL — ABNORMAL HIGH (ref 70–99)
Glucose-Capillary: 279 mg/dL — ABNORMAL HIGH (ref 70–99)
Glucose-Capillary: 129 mg/dL — ABNORMAL HIGH (ref 70–99)

## 2021-11-14 LAB — TSH: TSH: 2.28 u[IU]/mL (ref 0.350–4.500)

## 2021-11-14 LAB — HEMOGLOBIN A1C
Hgb A1c MFr Bld: 8.6 % — ABNORMAL HIGH (ref 4.8–5.6)
Mean Plasma Glucose: 200.12 mg/dL

## 2021-11-14 LAB — ECHOCARDIOGRAM COMPLETE
AR max vel: 2.16 cm2
AV Area VTI: 2.57 cm2
AV Area mean vel: 2.25 cm2
AV Mean grad: 5.5 mmHg
AV Peak grad: 10.8 mmHg
Ao pk vel: 1.65 m/s
Area-P 1/2: 1.67 cm2
Height: 63 in
MV VTI: 1.16 cm2
S' Lateral: 1.7 cm
Weight: 3968 oz

## 2021-11-14 LAB — CBC
HCT: 27.7 % — ABNORMAL LOW (ref 36.0–46.0)
Hemoglobin: 8.5 g/dL — ABNORMAL LOW (ref 12.0–15.0)
MCH: 19 pg — ABNORMAL LOW (ref 26.0–34.0)
MCHC: 30.7 g/dL (ref 30.0–36.0)
MCV: 62 fL — ABNORMAL LOW (ref 80.0–100.0)
Platelets: 222 10*3/uL (ref 150–400)
RBC: 4.47 MIL/uL (ref 3.87–5.11)
RDW: 17.2 % — ABNORMAL HIGH (ref 11.5–15.5)
WBC: 9 10*3/uL (ref 4.0–10.5)
nRBC: 0 % (ref 0.0–0.2)

## 2021-11-14 LAB — BASIC METABOLIC PANEL
Anion gap: 11 (ref 5–15)
BUN: 26 mg/dL — ABNORMAL HIGH (ref 8–23)
CO2: 29 mmol/L (ref 22–32)
Calcium: 8.7 mg/dL — ABNORMAL LOW (ref 8.9–10.3)
Chloride: 91 mmol/L — ABNORMAL LOW (ref 98–111)
Creatinine, Ser: 1.35 mg/dL — ABNORMAL HIGH (ref 0.44–1.00)
GFR, Estimated: 43 mL/min — ABNORMAL LOW (ref >60)
Glucose, Bld: 240 mg/dL — ABNORMAL HIGH (ref 70–99)
Potassium: 3.5 mmol/L (ref 3.5–5.1)
Sodium: 131 mmol/L — ABNORMAL LOW (ref 135–145)

## 2021-11-14 LAB — GLUCOSE, CAPILLARY: Glucose-Capillary: 161 mg/dL — ABNORMAL HIGH (ref 70–99)

## 2021-11-14 LAB — TROPONIN I (HIGH SENSITIVITY): Troponin I (High Sensitivity): 24 ng/L — ABNORMAL HIGH (ref <18)

## 2021-11-14 MED ORDER — ACETAMINOPHEN 500 MG PO TABS: 500.0000 mg | ORAL_TABLET | Freq: Four times a day (QID) | ORAL | Status: DC | PRN

## 2021-11-14 MED ORDER — TRAZODONE HCL 50 MG PO TABS
50.0000 mg | ORAL_TABLET | Freq: Every evening | ORAL | Status: DC | PRN
  Administered 2021-11-14: 50 mg via ORAL
  Filled 2021-11-14: qty 1

## 2021-11-14 MED ORDER — LEFLUNOMIDE 20 MG PO TABS
20.0000 mg | ORAL_TABLET | Freq: Every day | ORAL | Status: DC
  Administered 2021-11-14 – 2021-11-15 (×2): 20 mg via ORAL
  Filled 2021-11-14 (×3): qty 1

## 2021-11-14 MED ORDER — PANTOPRAZOLE SODIUM 40 MG PO TBEC
40.0000 mg | DELAYED_RELEASE_TABLET | Freq: Two times a day (BID) | ORAL | Status: DC
  Administered 2021-11-14 – 2021-11-15 (×3): 40 mg via ORAL
  Filled 2021-11-14 (×3): qty 1

## 2021-11-14 MED ORDER — DILTIAZEM HCL ER COATED BEADS 180 MG PO CP24
360.0000 mg | ORAL_CAPSULE | Freq: Every day | ORAL | Status: DC
  Administered 2021-11-14 – 2021-11-15 (×2): 360 mg via ORAL
  Filled 2021-11-14 (×2): qty 2

## 2021-11-14 MED ORDER — APIXABAN 5 MG PO TABS
5.0000 mg | ORAL_TABLET | Freq: Two times a day (BID) | ORAL | Status: DC
  Administered 2021-11-14 – 2021-11-15 (×3): 5 mg via ORAL
  Filled 2021-11-14 (×3): qty 1

## 2021-11-14 MED ORDER — INSULIN ASPART 100 UNIT/ML IJ SOLN
0.0000 [IU] | INTRAMUSCULAR | Status: DC
  Administered 2021-11-14 (×2): 4 [IU] via SUBCUTANEOUS
  Administered 2021-11-14: 15 [IU] via SUBCUTANEOUS
  Administered 2021-11-14 (×2): 11 [IU] via SUBCUTANEOUS
  Administered 2021-11-15: 3 [IU] via SUBCUTANEOUS
  Administered 2021-11-15 (×2): 4 [IU] via SUBCUTANEOUS
  Administered 2021-11-15: 7 [IU] via SUBCUTANEOUS
  Filled 2021-11-14: qty 0.2

## 2021-11-14 MED ORDER — INSULIN GLARGINE-YFGN 100 UNIT/ML ~~LOC~~ SOLN
15.0000 [IU] | Freq: Two times a day (BID) | SUBCUTANEOUS | Status: DC
  Administered 2021-11-14 – 2021-11-15 (×2): 15 [IU] via SUBCUTANEOUS
  Filled 2021-11-14 (×4): qty 0.15

## 2021-11-14 MED ORDER — ALBUTEROL SULFATE (2.5 MG/3ML) 0.083% IN NEBU: 2.5000 mg | INHALATION_SOLUTION | Freq: Four times a day (QID) | RESPIRATORY_TRACT | Status: DC | PRN

## 2021-11-14 MED ORDER — ATORVASTATIN CALCIUM 40 MG PO TABS
80.0000 mg | ORAL_TABLET | Freq: Every evening | ORAL | Status: DC
  Administered 2021-11-14: 80 mg via ORAL
  Filled 2021-11-14: qty 2

## 2021-11-14 MED ORDER — FOLIC ACID 1 MG PO TABS
1.0000 mg | ORAL_TABLET | Freq: Two times a day (BID) | ORAL | Status: DC
  Administered 2021-11-14 – 2021-11-15 (×3): 1 mg via ORAL
  Filled 2021-11-14 (×3): qty 1

## 2021-11-14 MED ORDER — HYDRALAZINE HCL 25 MG PO TABS
25.0000 mg | ORAL_TABLET | Freq: Three times a day (TID) | ORAL | Status: DC
  Administered 2021-11-14 – 2021-11-15 (×3): 25 mg via ORAL
  Filled 2021-11-14 (×3): qty 1

## 2021-11-14 NOTE — Progress Notes (Signed)
Inpatient Diabetes Program Recommendations  AACE/ADA: New Consensus Statement on Inpatient Glycemic Control (2015)  Target Ranges:  Prepandial:   less than 140 mg/dL      Peak postprandial:   less than 180 mg/dL (1-2 hours)      Critically ill patients:  140 - 180 mg/dL   Lab Results  Component Value Date   GLUCAP 279 (H) 11/14/2021   HGBA1C 8.6 (H) 11/14/2021    Review of Glycemic Control  Diabetes history: DM2 Outpatient Diabetes medications: OmniPod Dash Current orders for Inpatient glycemic control: Novolog 0-20 units Q4H  Pump settings: Basal - 2 units/H Bolus - CHO ratio - 1:10; Sensitivity 15 and Goal is 110 mg/dL  CBGs 253, 197, 312, 279 mg/dL  Spoke with pt at bedside regarding her diabetes control at home. Pt states she usually has good control with her pump. Took pump off when came in hospital. Has syringes at home and prescription for Lantus if needed when pump is off. Sees Endo every 3 months.   Inpatient Diabetes Program Recommendations:    Add Semglee 15 units BID Add Novolog 4 units TID with meals if eating > 50%  Follow glucose trends while inpatient.  Thank you. Lorenda Peck, RD, LDN, CDE Inpatient Diabetes Coordinator [phone]

## 2021-11-14 NOTE — ED Notes (Signed)
Pt Lynn [name] in bed, a/ox4. Pt states she was cooking dinner when she suddenly felt weak, dizzy, sweaty, and had pain in her back and chest, then passed out. Pt states shje was sitting at this point, no trauma, witnessed by spouse. States 2nd episode like this. Pt denies all of these symptoms currently.

## 2021-11-14 NOTE — Progress Notes (Addendum)
PROGRESS NOTE    Lynn [name]  OZH:[id number] DOB: May 02, 1956 DOA: 11/13/2021 PCP: [name], MD   Brief Narrative: This 66 years old female with PMH significant for CVA, hypertension, CAD, paroxysmal A. fib on eliquis, chronic diastolic heart failure with mitral valve stenosis and calcified mitral valve mass, insulin-dependent type 2 diabetes, rheumatoid arthritis, COPD, ILD on 3 L of supplemental oxygen at baseline presented in the ED s/p syncope.  Patient reports for last 10 days she has been having episodes where she would break out in  sweat and had a headache and nausea and vomiting.  Yesterday while cooking she became suddenly diaphoretic,  developed pain between shoulder blades and dizzy and passed out.  She also reported worsening shortness of breath requiring 4 L of supplemental oxygen which is higher than her baseline.  Patient was recently seen by cardiology and started on Eliquis for atrial fibrillation.  Patient has been advised to take Lasix 20 mg daily,  instead she has been taking 40 mg daily in addition to hydrochlorothiazide. Patient was admitted for syncope possible vasovagal.  Cardiology consulted,  recommended to discontinue Lasix and obtain echocardiogram.  Assessment & Plan:   Principal Problem:   Syncope Active Problems:   Essential hypertension   Chronic diastolic CHF (congestive heart failure) (HCC)   Moderate mitral stenosis by prior echocardiography   Type 2 diabetes mellitus with hyperglycemia, with long-term current use of insulin (HCC)   Anemia   PAF (paroxysmal atrial fibrillation) (HCC); CHA2DS2-VASc score 7   Acute on chronic respiratory failure with hypoxia (HCC)   Syncope: Likely vasovagal.  Possible overdiuresis with subsequent dehydration. Patient has been taking 40 mg of Lasix and hydrochlorothiazide. Patient reported recurrent episodes of diaphoresis with headache, nausea, vomiting associated palpitation. Repeat echocardiogram  shows LVEF 60 to 65%.  Severe mitral valve stenosis with calcified mass Cardiology consulted, obtain 2D echocardiogram. Hold on Lasix.  Continue telemetry. She might need Holter monitor at discharge.  Acute on chronic hypoxic respiratory failure: Patient does have a history of COPD and ILD. At baseline 2 to 3 L of supplemental oxygen. Chest x-ray negative.  Not in any acute exacerbation. Echocardiogram shows LVEF 60 to 65%  Acute kidney injury: Creatinine 1.8 up from 1.4 from baseline. Avoid nephrotoxic medication.  Chronic diastolic heart failure: Appears compensated. Continue to hold Lasix.  Paroxysmal atrial fibrillation: Continue Eliquis, continue diltiazem.  Essential hypertension: Continue Cardizem, hold Lasix and ACE I until renal function improves.  Rheumatoid arthritis: Continue leflunomide and chronic prednisone  History of CVA: Continue Eliquis  Type 2 diabetes: Continue resistant sliding scale.    DVT prophylaxis: Lovenox Code Status: Full code Family Communication: No family at bedside Disposition Plan:   Status is: Observation The patient remains OBS appropriate and will d/c before 2 midnights.  Planned Discharge Destination: Home  Consultants:  Cardiology  Procedures: Echocardiogram Antimicrobials: None  Subjective: Patient was seen and examined at bedside.  Overnight events noted.   Patient reports feeling improved.  She is still feeling dizzy and lightheaded.  Objective: Vitals:   11/14/21 1000 11/14/21 1100 11/14/21 1130 11/14/21 1340  BP: 136/63 (!) 144/83 (!) 141/79 (!) 152/84  Pulse: 74 82 77 85  Resp: (!) _0 Temp:      TempSrc:      SpO2: 100% 100% 100% 100%  Weight:      Height:        Intake/Output Summary (Last 24 hours) at 11/14/2021 1422 Last data filed  at 11/14/2021 1159 Gross per 24 hour  Intake 1000 ml  Output 2050 ml  Net -1050 ml   Filed Weights   11/13/21 1900  Weight: 112.5 kg     Examination:  General exam: Appears comfortable, not in any acute distress. Respiratory system: Clear to auscultation. Respiratory effort normal.  RR 15 Cardiovascular system: S1 & S2 heard, irregular rhythm, no murmur. Gastrointestinal system: Abdomen is soft , non tender,  non distended,  BS+ Central nervous system: Alert and oriented x 2. No focal neurological deficits. Extremities: No edema, no cyanosis, no clubbing. Skin: No rashes, lesions or ulcers Psychiatry: Judgement and insight appear normal. Mood & affect appropriate.     Data Reviewed: I have personally reviewed following labs and imaging studies  CBC: Recent Labs  Lab 11/13/21 1924 11/14/21 0350  WBC 12.6* 9.0  NEUTROABS 10.3*  --   HGB 10.1* 8.5*  HCT 32.9* 27.7*  MCV 61.6* 62.0*  PLT 266 256   Basic Metabolic Panel: Recent Labs  Lab 11/13/21 1924 11/14/21 0350  NA 133* 131*  K 3.5 3.5  CL 91* 91*  CO2 30 29  GLUCOSE 230* 240*  BUN 33* 26*  CREATININE 1.86* 1.35*  CALCIUM 9.1 8.7*   GFR: Estimated Creatinine Clearance: 48.8 mL/min (A) (by C-G formula based on SCr of 1.35 mg/dL (H)). Liver Function Tests: Recent Labs  Lab 11/13/21 1924  AST 19  ALT 17  ALKPHOS 85  BILITOT 0.5  PROT 8.2*  ALBUMIN 4.1   No results for input(s): LIPASE, AMYLASE in the last 168 hours. No results for input(s): AMMONIA in the last 168 hours. Coagulation Profile: No results for input(s): INR, PROTIME in the last 168 hours. Cardiac Enzymes: No results for input(s): CKTOTAL, CKMB, CKMBINDEX, TROPONINI in the last 168 hours. BNP (last 3 results) Recent Labs    03/20/21 1220  PROBNP 57.0   HbA1C: Recent Labs    11/14/21 0350  HGBA1C 8.6*   CBG: Recent Labs  Lab 11/14/21 0324 11/14/21 0820 11/14/21 1214  GLUCAP 253* 197* 312*   Lipid Profile: No results for input(s): CHOL, HDL, LDLCALC, TRIG, CHOLHDL, LDLDIRECT in the last 72 hours. Thyroid Function Tests: Recent Labs    11/14/21 0350  TSH  2.280   Anemia Panel: No results for input(s): VITAMINB12, FOLATE, FERRITIN, TIBC, IRON, RETICCTPCT in the last 72 hours. Sepsis Labs: No results for input(s): PROCALCITON, LATICACIDVEN in the last 168 hours.  Recent Results (from the past 240 hour(s))  Resp Panel by RT-PCR (Flu A&B, Covid) Nasopharyngeal Swab     Status: None   Collection Time: 11/13/21 10:00 PM   Specimen: Nasopharyngeal Swab; Nasopharyngeal(NP) swabs in vial transport medium  Result Value Ref Range Status   SARS Coronavirus 2 by RT PCR NEGATIVE NEGATIVE Final    Comment: (NOTE) SARS-CoV-2 target nucleic acids are NOT DETECTED.  The SARS-CoV-2 RNA is generally detectable in upper respiratory specimens during the acute phase of infection. The lowest concentration of SARS-CoV-2 viral copies this assay can detect is 138 copies/mL. A negative result does not preclude SARS-Cov-2 infection and should not be used as the sole basis for treatment or other patient management decisions. A negative result may occur with  improper specimen collection/handling, submission of specimen other than nasopharyngeal swab, presence of viral mutation(s) within the areas targeted by this assay, and inadequate number of viral copies(<138 copies/mL). A negative result must be combined with clinical observations, patient history, and epidemiological information. The expected result is Negative.  Fact  Sheet for Patients:  EntrepreneurPulse.com.au  Fact Sheet for Healthcare Providers:  IncredibleEmployment.be  This test is no t yet approved or cleared by the [place] FDA and  has been authorized for detection and/or diagnosis of SARS-CoV-2 by FDA under an Emergency Use Authorization (EUA). This EUA will remain  in effect (meaning this test can be used) for the duration of the COVID-19 declaration under Section 564(b)(1) of the Act, 21 U.S.C.section 360bbb-3(b)(1), unless the authorization is  terminated  or revoked sooner.       Influenza A by PCR NEGATIVE NEGATIVE Final   Influenza B by PCR NEGATIVE NEGATIVE Final    Comment: (NOTE) The Xpert Xpress SARS-CoV-2/FLU/RSV plus assay is intended as an aid in the diagnosis of influenza from Nasopharyngeal swab specimens and should not be used as a sole basis for treatment. Nasal washings and aspirates are unacceptable for Xpert Xpress SARS-CoV-2/FLU/RSV testing.  Fact Sheet for Patients: EntrepreneurPulse.com.au  Fact Sheet for Healthcare Providers: IncredibleEmployment.be  This test is not yet approved or cleared by the [place] FDA and has been authorized for detection and/or diagnosis of SARS-CoV-2 by FDA under an Emergency Use Authorization (EUA). This EUA will remain in effect (meaning this test can be used) for the duration of the COVID-19 declaration under Section 564(b)(1) of the Act, 21 U.S.C. section 360bbb-3(b)(1), unless the authorization is terminated or revoked.  Performed at [facility], [place] [address] [place], [place] [address]     Radiology Studies: DG CHEST PORT 1 VIEW  Result Date: 11/14/2021 CLINICAL DATA:  Dyspnea, syncope, hypotension EXAM: PORTABLE CHEST 1 VIEW COMPARISON:  04/26/2021 FINDINGS: Lungs are clear.  No pleural effusion or pneumothorax. The heart is top-normal in size. IMPRESSION: No evidence of acute cardiopulmonary disease. Electronically Signed   By: Julian Hy M.D.   On: 11/14/2021 02:20   ECHOCARDIOGRAM COMPLETE  Result Date: 11/14/2021    ECHOCARDIOGRAM REPORT   Patient Name:   Lynn [name] Date of Exam: 11/14/2021 Medical Rec #:  [id number]               Height:       63.0 in Accession #:    [id number]              Weight:       248.0 lb Date of Birth:  1956/02/16                BSA:          2.119 m Patient Age:    6 years                BP:           147/61 mmHg Patient Gender: F                        HR:           70 bpm. Exam Location:  Inpatient Procedure: 2D Echo Indications:    Syncope  History:        Patient has prior history of Echocardiogram examinations, most                 recent 03/24/2021. CAD; Risk Factors:Dyslipidemia and                 Hypertension. Mitral stenosis.  Sonographer:    Arlyss Gandy Referring Phys: [id number] [place] [facility]  Sonographer Comments: Patient is morbidly obese. Image acquisition challenging due to patient  body habitus. IMPRESSIONS  1. Left ventricular ejection fraction, by estimation, is 60 to 65%. The left ventricle has normal function. The left ventricle has no regional wall motion abnormalities. There is severe left ventricular hypertrophy. Left ventricular diastolic parameters  are consistent with Grade I diastolic dysfunction (impaired relaxation).  2. Right ventricular systolic function is normal. The right ventricular size is normal.  3. The pericardial effusion is circumferential.  4. Severe MAC has not changed since TEE. The mitral valve is degenerative. No evidence of mitral valve regurgitation. Moderate to severe mitral stenosis. The mean mitral valve gradient is 7.5 mmHg. Severe mitral annular calcification.  5. The aortic valve is normal in structure. Aortic valve regurgitation is not visualized. No aortic stenosis is present.  6. The inferior vena cava is normal in size with greater than 50% respiratory variability, suggesting right atrial pressure of 3 mmHg. FINDINGS  Left Ventricle: Left ventricular ejection fraction, by estimation, is 60 to 65%. The left ventricle has normal function. The left ventricle has no regional wall motion abnormalities. The left ventricular internal cavity size was normal in size. There is  severe left ventricular hypertrophy. Left ventricular diastolic parameters are consistent with Grade I diastolic dysfunction (impaired relaxation). Right Ventricle: The right ventricular size is normal. No increase in right ventricular wall  thickness. Right ventricular systolic function is normal. Left Atrium: Left atrial size was normal in size. Right Atrium: Right atrial size was normal in size. Pericardium: Trivial pericardial effusion is present. The pericardial effusion is circumferential. Mitral Valve: Severe MAC has not changed since TEE. The mitral valve is degenerative in appearance. There is severe thickening of the mitral valve leaflet(s). There is severe calcification of the mitral valve leaflet(s). Severe mitral annular calcification. No evidence of mitral valve regurgitation. Moderate to severe mitral valve stenosis. MV peak gradient, 15.1 mmHg. The mean mitral valve gradient is 7.5 mmHg. Tricuspid Valve: The tricuspid valve is normal in structure. Tricuspid valve regurgitation is not demonstrated. No evidence of tricuspid stenosis. Aortic Valve: The aortic valve is normal in structure. Aortic valve regurgitation is not visualized. No aortic stenosis is present. Aortic valve mean gradient measures 5.5 mmHg. Aortic valve peak gradient measures 10.8 mmHg. Aortic valve area, by VTI measures 2.57 cm. Pulmonic Valve: The pulmonic valve was normal in structure. Pulmonic valve regurgitation is not visualized. No evidence of pulmonic stenosis. Aorta: The aortic root is normal in size and structure. Venous: The inferior vena cava is normal in size with greater than 50% respiratory variability, suggesting right atrial pressure of 3 mmHg. IAS/Shunts: No atrial level shunt detected by color flow Doppler.  LEFT VENTRICLE PLAX 2D LVIDd:         2.60 cm   Diastology LVIDs:         1.70 cm   LV e' medial:    4.03 cm/s LV PW:         1.80 cm   LV E/e' medial:  42.7 LV IVS:        1.60 cm   LV e' lateral:   5.66 cm/s LVOT diam:     2.00 cm   LV E/e' lateral: 30.4 LV SV:         85 LV SV Index:   40 LVOT Area:     3.14 cm  RIGHT VENTRICLE             IVC RV Basal diam:  3.30 cm     IVC diam: 1.70 cm RV Mid diam:  2.50 cm RV S prime:     15.70 cm/s TAPSE  (M-mode): 1.9 cm LEFT ATRIUM             Index        RIGHT ATRIUM           Index LA diam:        4.20 cm 1.98 cm/m   RA Area:     13.00 cm LA Vol (A2C):   60.1 ml 28.37 ml/m  RA Volume:   25.30 ml  11.94 ml/m LA Vol (A4C):   61.5 ml 29.03 ml/m LA Biplane Vol: 60.8 ml 28.70 ml/m  AORTIC VALVE AV Area (Vmax):    2.16 cm AV Area (Vmean):   2.25 cm AV Area (VTI):     2.57 cm AV Vmax:           164.50 cm/s AV Vmean:          105.500 cm/s AV VTI:            0.330 m AV Peak Grad:      10.8 mmHg AV Mean Grad:      5.5 mmHg LVOT Vmax:         113.00 cm/s LVOT Vmean:        75.600 cm/s LVOT VTI:          0.270 m LVOT/AV VTI ratio: 0.82  AORTA Ao Root diam: 2.90 cm Ao Asc diam:  2.90 cm MITRAL VALVE MV Area (PHT): 1.67 cm     SHUNTS MV Area VTI:   1.16 cm     Systemic VTI:  0.27 m MV Peak grad:  15.1 mmHg    Systemic Diam: 2.00 cm MV Mean grad:  7.5 mmHg MV Vmax:       1.94 m/s MV Vmean:      128.0 cm/s MV Decel Time: 454 msec MV E velocity: 172.00 cm/s MV A velocity: 185.00 cm/s MV E/A ratio:  0.93 Candee Furbish MD Electronically signed by Candee Furbish MD Signature Date/Time: 11/14/2021/10:57:08 AM    Final      Scheduled Meds:  apixaban  5 mg Oral BID   atorvastatin  80 mg Oral QPM   diltiazem  360 mg Oral Daily   folic acid  1 mg Oral BID   insulin aspart  0-20 Units Subcutaneous Q4H   leflunomide  20 mg Oral Daily   pantoprazole  40 mg Oral BID AC   sodium chloride flush  3 mL Intravenous Q12H   Continuous Infusions:   LOS: 0 days    Time spent: 50 mins    Jemina Scahill, MD [facility]   If 7PM-7AM, please contact night-coverage

## 2021-11-14 NOTE — Consult Note (Addendum)
Cardiology Consultation:   Patient ID: [name] MRN: [id number]; DOB: 1956/02/03  Admit date: 11/13/2021 Date of Consult: 11/14/2021  PCP:  [name], MD   [facility] Providers Cardiologist:  Glenetta Hew, MD        Patient Profile:   Lynn [name] is a 66 y.o. 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  History of Present Illness:   Ms. Kellett was seen by Dr Ellyn Hack 01/13. Wt 248 lbs, volume ok. She was to take 20 mg Lasix unless wt gain, then 40 mg. However, she was taking 40 mg qd.   She continued to weigh daily, no wt loss. She is also on HCTZ.   Ozempic was started, her appetite was curbed. She had some nausea, no diarrhea.   She has been having spells. They start after exertion, with L chest pain 5-7/10, +chest wall tenderness. This is brief and followed by dripping sweats, loss of bladder control, dizziness. She wore a monitor for these sx, results below.   She had a spell the other day where the dizziness was so severe that she had to sit down to keep from falling.  She was cooking dinner yesterday, had a spell, she was very weak, husband helped her to a chair. She lost consciousness, woke up, and was vomiting. EMS had been called by her husband. She felt a little lethargic, but other sx resolved.   Lots of chronic back pain. She was having problems w/ back pain before the episode yesterday.    Past Medical History:  Diagnosis Date   Anxiety    Clotting disorder ([place])    blood clot in eye 2016   Coronary artery disease, non-occlusive    a. 11/2016 NSTEMI/Cath: LM nl, LAD 40p, 41m, D1/2 small, LCX 40ost, OM2/3 nl/small, RCA 35 ost/mid, RPDA/RPL small/nl.   Coronary  vasospasm ([facility])    Depression    Diastolic dysfunction    a. 11/2016 Echo: EF 55-60%, gr1 DD, sev calcified MV annulus, mildly dil LA.   Eye hemorrhage 01/2015   "right; resolved" (07/03/2017)   Headache    "monthly" (07/03/2017)   History of blood transfusion    "when I had an ectopic pregnancy"   History of stomach ulcers    Hyperlipidemia    Hypertension    Microcytic anemia    Moderate mitral stenosis by prior echocardiogram 03/2021   TTE: Mod MS (mean gradient 9 mmHg). -- TEE Moderate-Severe MS (mean gradient 14 mmHg) with fixed Post MV Leaflet & Severe MAC w/ large circumscribed calcified mass on the Post MV Leaflet. (Mass previously described in 2018) - CMRI identifies calcified mass & not Tumor.; R&LHC - LVEDP & PCWP both 23 mmHg indicating minimal resting gradient   OSA on CPAP    setting is unknown   PAF ([place]oxysmal [place] fibrillation) [place] 03/[place]   Event Monitor Aug-Sept 2022: Predominantly sinus rhythm.  1% A. fib burden.  30 brief episodes of PAT.  2 short bursts of NSVT.   PAT ([place]ox[place]mal [place] tachycardia) [place]    Event monitor from August 2022 showed 30 brief bursts longest 14 seconds.   Pneumonia    "several times" (07/03/2017)   PVC's (premature ventricular contractions)  Rheumatoid arthritis ([place])    Syncope 07/03/2017 X 2   called seizures but no medications   Thalassemia    "my cells are sickle cell shaped but I don't have sickle cell anemia" (07/03/2017)   Type II diabetes mellitus ([place])     Past Surgical History:  Procedure Laterality Date   48-Hour Monitor  03/18/2017   Sinus rhythm with sinus tachycardia (rate 58-134 BPM)multiple PVCs noted with couplets and bigeminy. One triplet. 7 runs of PAT ranging from 100-130 bpm. Longest was 33 beats.   BIOPSY  04/03/2021   Procedure: BIOPSY;  Surgeon: Yetta Flock, MD;  Location: [facility];  Service: Gastroenterology;;   BREAST BIOPSY Left    BRONCHIAL WASHINGS  03/28/2021   Procedure: BRONCHIAL  WASHINGS;  Surgeon: Julian Hy, DO;  Location: [facility];  Service: Endoscopy;;   CARDIAC CATHETERIZATION N/A 11/19/2016   Procedure: Left Heart Cath and Coronary Angiography;  Surgeon: Belva Crome, MD;  Location: [facility];  Service: Cardiovascular.    LM nl, LAD 40p, 77m, D1/2 small, LCX 40ost, OM2/3 nl/small, RCA 35 ost/mid, RPDA/RPL small/nl.   CARDIAC MRI  03/30/2021   Normal LVEF ~53%. 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 No Scar or Late Gadolinium Enhancement on LV Myocardium.   CARPAL TUNNEL RELEASE Right 11/01/2014   COLONOSCOPY     DILATION AND CURETTAGE OF UTERUS     ECTOPIC PREGNANCY SURGERY  X 2   ESOPHAGOGASTRODUODENOSCOPY (EGD) WITH PROPOFOL N/A 04/03/2021   Procedure: ESOPHAGOGASTRODUODENOSCOPY (EGD) WITH PROPOFOL;  Surgeon: AYetta Flock MD;  Location: [facility];  Service: Gastroenterology;  Laterality: N/A;   ESOPHAGOGASTRODUODENOSCOPY (EGD) WITH PROPOFOL N/A 08/15/2021   Procedure: ESOPHAGOGASTRODUODENOSCOPY (EGD) WITH PROPOFOL;  Surgeon: SLadene Artist MD;  Location: [facility];  Service: Endoscopy;  Laterality: N/A;   JOINT REPLACEMENT     KNEE ARTHROSCOPY Left 11/2014   RIGHT/LEFT HEART CATH AND CORONARY ANGIOGRAPHY N/A 08/08/2021   Procedure: RIGHT/LEFT HEART CATH AND CORONARY ANGIOGRAPHY;  Surgeon: HLeonie Man MD;  [facility];  Service: Cardiovascular;Ost LCx 40%. Ost-Prox RCA 35%. Prox-Mid LAD 25%-<25% D2. Mod-Severely Elevated LVEDP. Mild PA HTN -> PA mean 31 mmHg with a PCWP and LVEDP of 23 mmHg   TEE WITHOUT CARDIOVERSION N/A 03/28/2021   Procedure: TRANSESOPHAGEAL ECHOCARDIOGRAM (TEE);  Surgeon: NThayer Headings MD;  [facility];; LVEF 55-60%. Normal LV Fxn & no RWMA. No LAA thrombus. Gr II Ao Plaque. Large circumscribed calcific mass (1.8 x 1.4 cm) anterior to posterior annulus.  Fixed posterior leaflet with Mod-Severe MS (Mean MVG ~14 mmHg, Peak 20  mmhg).  Mild MR   TONSILLECTOMY     TOTAL KNEE ARTHROPLASTY Right 02/18/2015   Procedure: RIGHT TOTAL KNEE ARTHROPLASTY;  Surgeon: SNetta Cedars MD;  [facility]  Service: Orthopedics;  Laterality: Right;   TOTAL KNEE ARTHROPLASTY Left 08/19/2015   Procedure: LEFT TOTAL KNEE ARTHROPLASTY;  Surgeon: SNetta Cedars MD;  [place] [facility]  Service: Orthopedics;  Laterality: Left;   TRANSTHORACIC ECHOCARDIOGRAM  11/2016; 07/2017:   a) normal LV size, thickness and function. EF 55-60%. GR 1 DD.No RWMA severe mitral annular calcification, but no mitral stenosis. Mild LA dilation;; b) normal LV size and function.  EF 65-75%.  Unable to assess diastolic function.  Aortic sclerosis with no stenosis.  Moderate mitral stenosis? (Gradient 9 mmHg) -?  Mild-mod left atrial enlargement    TRANSTHORACIC ECHOCARDIOGRAM  01/2019   EF 65  to 70%.  Normal function.  Elevated LVEDP-GR 1 DD.  Normal RV size and function.  Large calcific mass on posterior mitral leaflet mild to moderate mitral stenosis.   TRANSTHORACIC ECHOCARDIOGRAM  03/24/2021   EF 60 to 65%.  LV function with normal wall motion.  Moderate basal septal LVH.  GRII DD & Mild LA dilation.-> elevated LVEDP.  Normal RV function.  Mildly elevated PAP.  Ill-defined density measuring 3.23 x 2.1 cm region of the posterior MV leaflet along with dense MAC.  Moderate MS (mean MVG of 9 mmHg.) Normal AoV & RAP. --> Recommend TEE 2/2 concern for MV SBE.   VAGINAL HYSTERECTOMY     VIDEO BRONCHOSCOPY N/A 03/28/2021   Procedure: VIDEO BRONCHOSCOPY WITHOUT FLUORO;  Surgeon: Julian Hy, DO;  Location: [facility];  Service: Endoscopy;  Laterality: N/A;     Home Medications:  Prior to Admission medications   Medication Sig Start Date End Date Taking? Authorizing Provider  acetaminophen (TYLENOL) 500 MG tablet Take 500 mg by mouth every 6 (six) hours as needed for fever.   Yes [provider]  acetaminophen-codeine (TYLENOL #3) 300-30 MG tablet Take 1-2  tablets by mouth every 8 (eight) hours as needed for moderate pain. 07/19/21  Yes Copland, Gay Filler, MD  albuterol (VENTOLIN HFA) 108 (90 Base) MCG/ACT inhaler Inhale 2 puffs into the lungs every 6 (six) hours as needed for wheezing or shortness of breath. 04/03/21  Yes Gonfa, Charlesetta Ivory, MD  apixaban (ELIQUIS) 5 MG TABS tablet Take 1 tablet (5 mg total) by mouth 2 (two) times daily. 10/27/21  Yes Leonie Man, MD  atorvastatin (LIPITOR) 80 MG tablet TAKE 1 TABLET(80 MG) BY MOUTH DAILY AT 6 PM Patient taking differently: Take 80 mg by mouth every evening. 02/20/21  Yes Copland, Gay Filler, MD  cetirizine (ZYRTEC) 10 MG tablet Take 10 mg by mouth daily as needed for allergies.   Yes [provider]  diltiazem (CARDIZEM CD) 360 MG 24 hr capsule Take 1 capsule (360 mg total) by mouth daily. 07/05/21  Yes Leonie Man, MD  folic acid (FOLVITE) 1 MG tablet Take 2 tablets (2 mg total) by mouth daily. Patient taking differently: Take 1 mg by mouth in the morning and at bedtime. 01/21/20  Yes Copland, Gay Filler, MD  furosemide (LASIX) 40 MG tablet Decrease Lasix ( furosemide)  to 20 mg ( 1/2/tablet)  , with 20 mg  you may take a an additional 20 mg if need for swelling or weight gain more than 3 lbs overnight or 5 lbs in one week -- if you have continue to take an additional 20 mg , go back to 40 mg  Take additional dose as needed for egt gain> 3 lb in 1 day Patient taking differently: Take 40 mg by mouth daily. Decrease Lasix ( furosemide)  to 20 mg ( 1/2/tablet)  , with 20 mg  you may take a an additional 20 mg if need for swelling or weight gain more than 3 lbs overnight or 5 lbs in one week -- if you have continue to take an additional 20 mg , go back to 40 mg  Take additional dose as needed for egt gain> 3 lb in 1 day 10/27/21  Yes Leonie Man, MD  hydrochlorothiazide (HYDRODIURIL) 12.5 MG tablet Take 12.5 mg by mouth daily. 07/29/21  Yes [provider]  insulin lispro (HUMALOG) 100  UNIT/ML injection Max Daily 100 units per pump 09/11/21  Yes [name],  Melanie Crazier, MD  leflunomide (ARAVA) 20 MG tablet Take 20 mg by mouth daily. 08/16/21  Yes [provider]  losartan (COZAAR) 50 MG tablet Take 1 tablet (50 mg total) by mouth daily. 07/05/21 11/13/21 Yes Leonie Man, MD  Melatonin 5 MG CHEW Chew 5 mg by mouth at bedtime.   Yes [provider]  Menthol-Methyl Salicylate (SALONPAS PAIN RELIEF PATCH) PTCH Apply 1 patch topically daily as needed (pain).   Yes [provider]  Multiple Vitamin (MULTIVITAMIN WITH MINERALS) TABS tablet Take 1 tablet by mouth daily.   Yes [provider]  Oxymetazoline HCl (VICKS SINEX NA) Place 1 spray into the nose daily as needed (congestion).   Yes [provider]  pantoprazole (PROTONIX) 40 MG tablet Take 1 tablet (40 mg total) by mouth 2 (two) times daily before a meal. 07/07/21  Yes Ladene Artist, MD  predniSONE (DELTASONE) 5 MG tablet Take 5 mg by mouth daily. 08/16/21  Yes [provider]  traZODone (DESYREL) 50 MG tablet TAKE 0.5-1 TABLETS BY MOUTH AT BEDTIME AS NEEDED FOR SLEEP. Patient taking differently: Take 50 mg by mouth at bedtime as needed for sleep. 05/02/21  Yes Copland, Gay Filler, MD  Continuous Blood Gluc Sensor (DEXCOM G6 SENSOR) MISC 1 Device by Does not apply route as directed. 09/11/21   [name], Melanie Crazier, MD  Continuous Blood Gluc Transmit (DEXCOM G6 TRANSMITTER) MISC 1 Device by Does not apply route as directed. 09/11/21   [name], Melanie Crazier, MD  glucose blood (CONTOUR NEXT TEST) test strip 3x daily 07/20/19   [name], Melanie Crazier, MD  Insulin Disposable Pump (OMNIPOD DASH PODS, GEN 4,) MISC Inject 1 Dose into the skin continuous. Using pump - depends on carb intake. 10/03/20   [provider]  Insulin Pen Needle (B-D UF III MINI PEN NEEDLES) 31G X 5 MM MISC USE DAILY WITH VICTOZA AND LANTUS. 07/20/19   [name], Melanie Crazier, MD   Insulin Pen Needle 31G X 5 MM MISC 1 Device by Does not apply route 3 (three) times daily. 01/21/20   Copland, Gay Filler, MD  Lancets 30G MISC 1 Device by Does not apply route 3 (three) times daily. 07/16/19   Copland, Gay Filler, MD  SAFETY-LOK TB SYRINGE 27GX.5" 27G X 1/2" 1 ML MISC  08/18/19   [provider]  Semaglutide, 2 MG/DOSE, 8 MG/3ML SOPN Inject 2 mg as directed once a week. 09/11/21   [name], Melanie Crazier, MD  VICTOZA 18 MG/3ML SOPN INJECT 1.8 MG UNDER THE SKIN ONCE DAILY Patient not taking: Reported on 11/13/2021 02/20/21   Copland, Gay Filler, MD    Inpatient Medications: Scheduled Meds:  apixaban  5 mg Oral BID   atorvastatin  80 mg Oral QPM   diltiazem  360 mg Oral Daily   folic acid  1 mg Oral BID   insulin aspart  0-20 Units Subcutaneous Q4H   leflunomide  20 mg Oral Daily   pantoprazole  40 mg Oral BID AC   sodium chloride flush  3 mL Intravenous Q12H   Continuous Infusions:  PRN Meds: acetaminophen, albuterol, traZODone  Allergies:    Allergies  Allergen Reactions   Penicillins Hives    Childhood allergy Has patient had a PCN reaction causing immediate rash, facial/tongue/throat swelling, SOB or lightheadedness with hypotension: Yes Has patient had a PCN reaction causing severe rash involving mucus membranes or skin necrosis: No Has patient had a PCN reaction that required hospitalization No Has patient had a  PCN reaction occurring within the last 10 years: No If all of the above answers are "NO", then may proceed with Cephalosporin use.   Metformin And Related Other (See Comments)    Diarrhea, bleeding   Farxiga [Dapagliflozin] Other (See Comments)    Dizzy and lethargic     Social History:   Social History   Socioeconomic History   Marital status: Married    Spouse name: Not on file   Number of children: 1   Years of education: Not on file   Highest education level: Not on file  Occupational History   Occupation: retired  Tobacco Use    Smoking status: Former    Packs/day: 0.25    Years: 44.00    Pack years: 11.00    Types: Cigarettes    Quit date: 02/17/2015    Years since quitting: 6.7   Smokeless tobacco: Never  Vaping Use   Vaping Use: Never used  Substance and Sexual Activity   Alcohol use: Not Currently    Comment: 07/03/2017 "might have a few drinks/year"   Drug use: No   Sexual activity: Never  Other Topics Concern   Not on file  Social History Narrative   Not on file   Social Determinants of Health   Financial Resource Strain: Low Risk    Difficulty of Paying Living Expenses: Not hard at all  Food Insecurity: No Food Insecurity   Worried About Charity fundraiser in the Last Year: Never true   [place] in the Last Year: Never true  Transportation Needs: No Transportation Needs   Lack of Transportation (Medical): No   Lack of Transportation (Non-Medical): No  Physical Activity: Inactive   Days of Exercise per Week: 0 days   Minutes of Exercise per Session: 0 min  Stress: Stress Concern Present   Feeling of Stress : To some extent  Social Connections: Moderately Isolated   Frequency of Communication with Friends and Family: More than three times a week   Frequency of Social Gatherings with Friends and Family: More than three times a week   Attends Religious Services: Never   Marine scientist or Organizations: No   Attends Music therapist: Never   Marital Status: Married  Human resources officer Violence: Not At Risk   Fear of Current or Ex-Partner: No   Emotionally Abused: No   Physically Abused: No   Sexually Abused: No    Family History:    Family History  Problem Relation Age of Onset   Cancer Mother    Diabetes Mother    Hypertension Mother    Hyperlipidemia Mother    Cancer Father    Hyperlipidemia Father    Mental illness Sister    Diabetes Sister    Diabetes Maternal Grandmother    Diabetes Maternal Grandfather    Colon cancer Neg Hx    Esophageal  cancer Neg Hx    Stomach cancer Neg Hx    Rectal cancer Neg Hx      ROS:  Please see the history of present illness.  All other ROS reviewed and negative.     Physical Exam/Data:   Vitals:   11/14/21 0000 11/14/21 0300 11/14/21 0530 11/14/21 0831  BP: (!) 132/57 112/76 (!) 147/61 126/68  Pulse: 77 69 70 77  Resp: _0 Temp:      TempSrc:      SpO2: 100% 100% 100% 100%  Weight:  Height:        Intake/Output Summary (Last 24 hours) at 11/14/2021 0922 Last data filed at 11/14/2021 1610 Gross per 24 hour  Intake 1000 ml  Output 1450 ml  Net -450 ml   Last 3 Weights 11/13/2021 10/27/2021 09/11/2021  Weight (lbs) 248 lb 248 lb 248 lb  Weight (kg) 112.492 kg 112.492 kg 112.492 kg     Body mass index is 43.93 kg/m.  General:  Well nourished, well developed, obese female in no acute distress HEENT: normal Neck: no JVD Vascular: No carotid bruits; Distal pulses 2+ bilaterally Cardiac:  normal S1, S2; RRR; soft diastolic murmur  Lungs:  clear to auscultation bilaterally, no wheezing, rhonchi or rales, chest wall tenderness noted to the upper mid left chest close to the sternal border Abd: soft, nontender, no hepatomegaly  Ext: no edema Musculoskeletal:  No deformities, BUE and BLE strength normal and equal Skin: warm and dry  Neuro:  CNs 2-12 intact, no focal abnormalities noted Psych:  Normal affect   EKG:  The EKG was personally reviewed and demonstrates:  01/30 ECG is SR, HR 77, low voltage precordial leads is unchanged Telemetry:  Telemetry was personally reviewed and demonstrates: Sinus rhythm  Relevant CV Studies:  ECHO: ordered  MONITOR: 06/29/2021  2 separate monitors worn for total of 21 days.  Overall predominantly sinus rhythm with minimum heart rate of 55 bpm, maximum 140 bpm and average of 81 bpm.  Atrial fibrillation was seen with a 1% burden, longest duration was 1 hour 5 minutes. Heart rate ranged from 53 to 117 bpm.  30 episodes of brief  supraventricular tachycardia/atrial tachycardia: Fastest heart rate 156 bpm 6 beats. Longest was 14 seconds with an average heart rate of 115 bpm.  2 Short Episodes of Nonsustained Ventricular Tachycardia longest and fastest run was average 140 bpm with a high of 167 bpm For 7 minutes  ECHO: 03/24/2021  1. Left ventricular ejection fraction, by estimation, is 60 to 65%. The  left ventricle has normal function. The left ventricle has no regional  wall motion abnormalities. There is moderate left ventricular hypertrophy  of the basal-septal segment. Left  ventricular diastolic parameters are consistent with Grade II diastolic  dysfunction (pseudonormalization). Elevated left ventricular end-diastolic  pressure.   2. Right ventricular systolic function is normal. The right ventricular  size is normal. There is mildly elevated pulmonary artery systolic  pressure. The estimated right ventricular systolic pressure is 96.0 mmHg.   3. Left atrial size was mildly dilated.   4. The mitral valve is abnormal. There is an ill defined density  measuring 3.23 x 2.10cm that appears to originate off the posterior MV  leaflet but cannot be definite on this. There is also mitral annular  calcification present. There is moderate mitral  stenosis with a mean MVG of 76mHg. No evidence of mitral valve  regurgitation.   5. The aortic valve is normal in structure. Aortic valve regurgitation is  not visualized. No aortic stenosis is present.   6. The inferior vena cava is normal in size with greater than 50%  respiratory variability, suggesting right atrial pressure of 3 mmHg.   7. Recommend TEE for possible mitral valve endocarditis in setting of  abnormal mitral valve and fevers.   TEE: 03/28/2021 1. Left ventricular ejection fraction, by estimation, is 55 to 60%. The left ventricle has normal function. The left ventricle has no regional wall motion abnormalities. 2. Right ventricular systolic function was  not well visualized. The  right ventricular size is not well visualized. 3. No left atrial/left atrial appendage thrombus was detected. 4. 3-D images of the mitral annular mass were obtained. There is a large circumscribed mass adherent to the posterior annulus. The posterior leaflet is fixed . There is moderate - severe mitral stenosis. The mass is large , measuring 1.8 x 1.4 cm. Differential diagnosis includes myxoma, exuberant mitral annular calcification, myxoma, or other primary cardiac tumor. suggest Cardiac MRI for further evaluation . This mass was present on transthoracic echo from 2018. . The mitral valve is abnormal. Mild mitral valve regurgitation. Moderate to severe mitral stenosis. Severe mitral annular calcification. 5. The aortic valve is tricuspid. Aortic valve regurgitation is not visualized. No aortic stenosis is present. 6. There is mild (Grade II) plaque.  R/L HEART CATH: 08/08/2021   Ost Cx lesion is 40% stenosed.   Ost RCA to Prox RCA lesion is 35% stenosed.   Prox LAD to Mid LAD lesion is 25% stenosed with 25% stenosed side branch in 2nd Diag.   Mid LAD to Dist LAD lesion is 30% stenosed.   LV end diastolic pressure is severely elevated.   Hemodynamic findings consistent with mild pulmonary hypertension.   There is no aortic valve stenosis.   SUMMARY Minimal Coronary Artery Disease Mild Secondary Pulmonry Hypertension (Pulmonary Venous Hypertension) -> PA mean 31 mmHg with a PCWP and LVEDP of 23 mmHg. Known moderate to severe mitral stenosis without significant PCWP elevation. Cardiac output-index 6.43-3.0.  Borderline.     RECOMMENDATIONS Gave 40 mg IV Lasix in the Cath Lab. Will initiate Lasix 40 mg daily oral Blood pressures at home were not as high as they are here, therefore we will continue current dose of losartan and diltiazem. Continue to optimize medical therapy.  Not yet at stage to refer for mitral valve surgery.  Laboratory Data:  High  Sensitivity Troponin:   Recent Labs  Lab 11/13/21 1924 11/13/21 2340  TROPONINIHS 21* 24*     Chemistry Recent Labs  Lab 11/13/21 1924 11/14/21 0350  NA 133* 131*  K 3.5 3.5  CL 91* 91*  CO2 30 29  GLUCOSE 230* 240*  BUN 33* 26*  CREATININE 1.86* 1.35*  CALCIUM 9.1 8.7*  GFRNONAA 29* 43*  ANIONGAP 12 11    Recent Labs  Lab 11/13/21 1924  PROT 8.2*  ALBUMIN 4.1  AST 19  ALT 17  ALKPHOS 85  BILITOT 0.5   Lipids No results for input(s): CHOL, TRIG, HDL, LABVLDL, LDLCALC, CHOLHDL in the last 168 hours.  Hematology Recent Labs  Lab 11/13/21 1924 11/14/21 0350  WBC 12.6* 9.0  RBC 5.34* 4.47  HGB 10.1* 8.5*  HCT 32.9* 27.7*  MCV 61.6* 62.0*  MCH 18.9* 19.0*  MCHC 30.7 30.7  RDW 17.5* 17.2*  PLT 266 222   Thyroid  Recent Labs  Lab 11/14/21 0350  TSH 2.280    BNP Recent Labs  Lab 11/13/21 1924  BNP 90.8    DDimer No results for input(s): DDIMER in the last 168 hours.   Radiology/Studies:  DG CHEST PORT 1 VIEW  Result Date: 11/14/2021 CLINICAL DATA:  Dyspnea, syncope, hypotension EXAM: PORTABLE CHEST 1 VIEW COMPARISON:  04/26/2021 FINDINGS: Lungs are clear.  No pleural effusion or pneumothorax. The heart is top-normal in size. IMPRESSION: No evidence of acute cardiopulmonary disease. Electronically Signed   By: Julian Hy M.D.   On: 11/14/2021 02:20     Assessment and Plan:   Syncope - Description suggest vasovagal syncope given  her prodromal symptoms.  Suspect may have occurred in setting of dehydration from her diuretics --She has been having consistent spells that are all the same. -The when she had last night was the worst, resulting in actual loss of consciousness - Others have caused dizziness but she would sit and rest and they would resolve - This is in association with chest pain, but the chest pain is very brief and does not last through the whole time - She has occasional palpitations but the palpitations are not occurring in  conjunction with the spells - On a telemetry monitor that she wore 4 months ago, she had up to 14 seconds of SVT and up to an hour of atrial fibrillation - We will plan monitor on discharge - Follow-up on echo results today - Will order orthostatic vital signs in case this is a problem, but they were not checked on arrival, and she has gotten over a liter of IV fluids.  2.  AKI on CKD: - Her creatinine is generally between 1.0 and 1.2. - On 10/24/2021 her creatinine was 1.4, on admission it was 1.86 and is now down to 1.35 - Both her diuretics, and losartan are on hold - She has also been hydrated with a liter of lactated Ringer's - BUN and creatinine have improved, will have to watch carefully for volume overload  3.  HFpEF -Her volume status was good at her recent office visit with a weight of 248 pounds - With Lasix on hold, will have to track intake/output and daily weights carefully -Right now she is -450 mL since admission  4.  CAD with history of spasm - She had mild nonobstructive disease at cath 07/2021, no essential change even though she has continued to have chest pain felt secondary to coronary vasospasm -Minimal troponin elevation is not consistent with ACS - She has been continued on her Cardizem here   Risk Assessment/Risk Scores:     HEAR Score (for undifferentiated chest pain):  HEAR Score: 4  New York Heart Association (NYHA) Functional Class NYHA Class III  CHA2DS2-VASc Score = 5   This indicates a 7.2% annual risk of stroke. The patient's score is based upon: CHF History: 0 HTN History: 1 Diabetes History: 1 Stroke History: 0 Vascular Disease History: 1 Age Score: 1 Gender Score: 1  For questions or updates, please contact [place] Please consult [email or web address] for contact info under    Signed, Rosaria Ferries, PA-C  11/14/2021 9:23 AM  Patient seen and examined.  Agree with above documentation.  Ms. 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 Dwyane Dee for evaluation of syncope.  She follows with Dr. Ellyn Hack, last seen on 10/27/2021.  She was supposed be taking Lasix 20 mg daily unless gaining weight and could take 40 mg.  However had been taking 40 mg daily, as well as HCTZ.  She has been having issues with dizziness.  Yesterday she had an episode of severe dizziness while cooking dinner.  Reports she lost consciousness.  She wore monitor for 21 days 06/2021, showed atrial fibrillation with 1% burden with longest episode 65 minutes; 30 episodes of SVT/AT with longest lasting 14 seconds, 2 short episodes of NSVT.  RHC/LHC on 07/2021 showed minimal CAD (40% ostial LCx, 35%  ostial to proximal RCA, 25% proximal to mid LAD, 30% mid to distal LAD, 25% D2) and elevated filling pressures (RA 13, RV 43/8, PA 45/17/31, PCWP 23, LVEDP 23, CI 3).  She was started on Lasix at that time.  Echocardiogram 03/24/2021 showed EF 60 to 65%, moderate basal septal LVH, grade 2 diastolic dysfunction, normal RV function, RVSP 37, moderate mitral stenosis with posterior leaflet mass.  TEE on 03/28/2021 showed EF 55 to 60%, posterior mitral annular mass with moderate to severe mitral stenosis, severe MAC.  Cardiac MRI on 03/30/2021 showed post anterior mitral annulus mass consistent with degenerative mitral annular calcification which is also confirmed on CT scan.  On presentation to the ED, vital signs notable for BP 164/101, SPO2 100% on 4 L,  pulse 77.  Labs notable for creatinine 1.86 (up from 1.4 on last check, improved to 1.35 this morning with IV fluids), sodium 133, potassium 3.5, BNP 91, troponin 21 > 24, hemoglobin 10.1, platelets 266, WBC 12.6, TSH 2.3.  Chest x-ray  unremarkable.  EKG shows sinus rhythm, rate 77, low voltage.  On exam, patient is alert and oriented, regular rate and rhythm, no murmurs, lungs CTAB, no LE edema.  For her syncopal episode, her description suggests likely vasovagal syncope.  She describes prodromal symptoms including lightheadedness and tunnel vision preceding her syncopal episode.  Suspect this occurred in setting of dehydration from her diuretic use.  She was supposed to be taking Lasix 20 mg daily but has been taking 40 mg daily.  Reports has been having lightheadedness with standing.  She had AKI on presentation that has resolved with IV fluids.  Continue to hold Lasix for now.  Will follow-up echocardiogram and likely plan cardiac monitor on discharge to rule out arrhythmia.  Donato Heinz, MD

## 2021-11-14 NOTE — ED Notes (Signed)
Physician Dwyane Dee contacted regarding pt BP

## 2021-11-14 NOTE — Progress Notes (Signed)
Echocardiogram 2D Echocardiogram has been performed.  Arlyss Gandy 11/14/2021, 9:23 AM

## 2021-11-15 ENCOUNTER — Telehealth: Payer: Self-pay | Admitting: Physician Assistant

## 2021-11-15 ENCOUNTER — Observation Stay (INDEPENDENT_AMBULATORY_CARE_PROVIDER_SITE_OTHER): Payer: Medicare Other

## 2021-11-15 DIAGNOSIS — R55 Syncope and collapse: Secondary | ICD-10-CM | POA: Diagnosis not present

## 2021-11-15 DIAGNOSIS — I5032 Chronic diastolic (congestive) heart failure: Secondary | ICD-10-CM | POA: Diagnosis not present

## 2021-11-15 LAB — BASIC METABOLIC PANEL
Anion gap: 7 (ref 5–15)
BUN: 19 mg/dL (ref 8–23)
CO2: 32 mmol/L (ref 22–32)
Calcium: 8.9 mg/dL (ref 8.9–10.3)
Chloride: 96 mmol/L — ABNORMAL LOW (ref 98–111)
Creatinine, Ser: 1.03 mg/dL — ABNORMAL HIGH (ref 0.44–1.00)
GFR, Estimated: 60 mL/min — ABNORMAL LOW (ref >60)
Glucose, Bld: 201 mg/dL — ABNORMAL HIGH (ref 70–99)
Potassium: 3.5 mmol/L (ref 3.5–5.1)
Sodium: 135 mmol/L (ref 135–145)

## 2021-11-15 LAB — CBC
HCT: 28.2 % — ABNORMAL LOW (ref 36.0–46.0)
Hemoglobin: 8.3 g/dL — ABNORMAL LOW (ref 12.0–15.0)
MCH: 18.6 pg — ABNORMAL LOW (ref 26.0–34.0)
MCHC: 29.4 g/dL — ABNORMAL LOW (ref 30.0–36.0)
MCV: 63.1 fL — ABNORMAL LOW (ref 80.0–100.0)
Platelets: 210 10*3/uL (ref 150–400)
RBC: 4.47 MIL/uL (ref 3.87–5.11)
RDW: 17.2 % — ABNORMAL HIGH (ref 11.5–15.5)
WBC: 6.7 10*3/uL (ref 4.0–10.5)
nRBC: 0 % (ref 0.0–0.2)

## 2021-11-15 LAB — GLUCOSE, CAPILLARY
Glucose-Capillary: 238 mg/dL — ABNORMAL HIGH (ref 70–99)
Glucose-Capillary: 189 mg/dL — ABNORMAL HIGH (ref 70–99)
Glucose-Capillary: 145 mg/dL — ABNORMAL HIGH (ref 70–99)
Glucose-Capillary: 185 mg/dL — ABNORMAL HIGH (ref 70–99)

## 2021-11-15 LAB — MAGNESIUM: Magnesium: 2.2 mg/dL (ref 1.7–2.4)

## 2021-11-15 LAB — PHOSPHORUS: Phosphorus: 2.4 mg/dL — ABNORMAL LOW (ref 2.5–4.6)

## 2021-11-15 MED ORDER — LOSARTAN POTASSIUM 50 MG PO TABS
50.0000 mg | ORAL_TABLET | Freq: Every day | ORAL | Status: DC
  Administered 2021-11-15: 50 mg via ORAL
  Filled 2021-11-15: qty 1

## 2021-11-15 MED ORDER — ORAL CARE MOUTH RINSE
15.0000 mL | Freq: Two times a day (BID) | OROMUCOSAL | Status: DC
  Administered 2021-11-15: 15 mL via OROMUCOSAL

## 2021-11-15 MED ORDER — FUROSEMIDE 20 MG PO TABS: ORAL_TABLET | ORAL | Status: DC

## 2021-11-15 NOTE — Discharge Instructions (Signed)
Advised to follow-up with primary care physician in 1 week. Advised to follow-up with cardiology as scheduled. Patient presented with syncopal episode likely vasovagal given diuretic use. Advised to resume diuretics at the lower dose.

## 2021-11-15 NOTE — Care Management Obs Status (Signed)
[place] NOTIFICATION   Patient Details  Name: Lynn [name] MRN: [id number] Date of Birth: 1956/08/05   Medicare Observation Status Notification Given:  Yes    Purcell Mouton, RN 11/15/2021, 1:37 PM

## 2021-11-15 NOTE — Progress Notes (Unsigned)
Enrolled patient for a 14 day Zio AT monitor to be mailed to patients home   Dr Harding to read 

## 2021-11-15 NOTE — Evaluation (Signed)
Physical Therapy Evaluation Patient Details Name: Lynn [name] MRN: [id number] DOB: [birth year] Today's Date: 11/15/2021  History of Present Illness  Pt is 66 yo female admitted on 11/13/21 with vasovagal syncopal episode.  She did have cardiology consult and felt related to increased lasix/overdiuresis.  Pt with hx including but not limited to CVA, HTN, CAD, afib, CHF with mitral valve stenosis, DM, RA, COPD, ILD on 3 L O2.  Clinical Impression  Pt admitted with above diagnosis.  She has been ambulating to bathroom independently and ambulated 400' in hallway with supervision safely.  She wore 3 L O2 with sats 100%.  Blood pressures were stable with transfers and pt has no syncopal symptoms.  Reports feeling much better and back to baseline.  She has had back pain that radiates down R leg for past 7 months - she has orthopedic appointment scheduled,  advised to consider outpt PT after appointment.        Recommendations for follow up therapy are one component of a multi-disciplinary discharge planning process, led by the attending physician.  Recommendations may be updated based on patient status, additional functional criteria and insurance authorization.  Follow Up Recommendations No PT follow up (no immediate follow-up - did educate on outpt PT for back pain after her scheduled appt with ortho)    Assistance Recommended at Discharge PRN  Patient can return home with the following       Equipment Recommendations    Recommendations for Other Services       Functional Status Assessment Patient has not had a recent decline in their functional status     Precautions / Restrictions Precautions Precautions: Fall      Mobility  Bed Mobility Overal bed mobility: Needs Assistance Bed Mobility: Supine to Sit, Sit to Supine     Supine to sit: Modified independent (Device/Increase time), HOB elevated Sit to supine: Modified independent (Device/Increase time), HOB elevated    General bed mobility comments: Pt up in bathroom at arrival    Transfers Overall transfer level: Needs assistance Equipment used: None Transfers: Sit to/from Stand Sit to Stand: Supervision           General transfer comment: performed safely; no syncopal symptoms    Ambulation/Gait Ambulation/Gait assistance: Supervision Gait Distance (Feet): 400 Feet Assistive device: Rolling walker (2 wheels) Gait Pattern/deviations: Step-through pattern Gait velocity: baseline     General Gait Details: Ambulated 400' with supervision for safety; baseline gait pattern; VSS  Stairs            Wheelchair Mobility    Modified Rankin (Stroke Patients Only)       Balance Overall balance assessment: Needs assistance Sitting-balance support: No upper extremity supported Sitting balance-Leahy Scale: Normal     Standing balance support: No upper extremity supported Standing balance-Leahy Scale: Good Standing balance comment: Ambulated with RW for back pain but walking in room without AD                             Pertinent Vitals/Pain Pain Assessment Pain Assessment: 0-10 Pain Score: 8  Pain Location: Back Pain Descriptors / Indicators: Sore Pain Intervention(s): Limited activity within patient's tolerance, Monitored during session    Home Living Family/patient expects to be discharged to:: Private residence Living Arrangements: Spouse/significant other Available Help at Discharge: Family;Available 24 hours/day Type of Home: House Home Access: Stairs to enter Entrance Stairs-Rails: Can reach both Entrance Stairs-Number of Steps: 3 Alternate Level Stairs-Number  of Steps: flight with stair lift Home Layout: Two level Home Equipment: [place] (2 wheels);[employer] geologist (4 wheels) Additional Comments: home O2 3L; adjustable bed    Prior Function Prior Level of Function : Independent/Modified Independent              Mobility Comments: Ambulates without AD in house; does use walker in community ADLs Comments: Pt does ADLs and IADLs; does reports some limitations due to chronic back pain     Hand Dominance   Dominant Hand: Right    Extremity/Trunk Assessment   Upper Extremity Assessment Upper Extremity Assessment: Overall WFL for tasks assessed    Lower Extremity Assessment Lower Extremity Assessment: Overall WFL for tasks assessed    Cervical / Trunk Assessment Cervical / Trunk Assessment: Other exceptions Cervical / Trunk Exceptions: obesity  Communication   Communication: No difficulties  Cognition Arousal/Alertness: Awake/alert Behavior During Therapy: WFL for tasks assessed/performed Overall Cognitive Status: Within Functional Limits for tasks assessed                                          General Comments General comments (skin integrity, edema, etc.): Pt on 3 L O2 with sats 100% throughout session.  Blood pressures were stable with sitting and standing.  Pt had no syncopal symptoms.  Pt report back pain 7 months that is in R low back and radiates down R leg when in bed.  She reports no change in symptoms with meds, heat, or ice.  Reports pain radiates down leg when supine, prone, or sidelying; improves with standing. Pain was not overly effected with rotation, lateral flexion, flexion, or extension.  Did educate on centralization vs peripheralization with centralization being a good sign. She has outpt ortho appointment scheduled to assist.  Advised to consider outpt PT for back pain.   Exercises     Assessment/Plan    PT Assessment Patient does not need any further PT services  PT Problem List         PT Treatment Interventions      PT Goals (Current goals can be found in the Care Plan section)  Acute Rehab PT Goals Patient Stated Goal: return home; get ready/stable for her girls trip to the [place] PT Goal Formulation: All assessment and education  complete, DC therapy    Frequency       Co-evaluation               AM-PAC PT "6 Clicks" Mobility  Outcome Measure Help needed turning from your back to your side while in a flat bed without using bedrails?: None Help needed moving from lying on your back to sitting on the side of a flat bed without using bedrails?: None Help needed moving to and from a bed to a chair (including a wheelchair)?: None Help needed standing up from a chair using your arms (e.g., wheelchair or bedside chair)?: None Help needed to walk in hospital room?: None Help needed climbing 3-5 steps with a railing? : A Little 6 Click Score: 23    End of Session Equipment Utilized During Treatment: Gait belt Activity Tolerance: Patient tolerated treatment well Patient left: in bed;with call bell/phone within reach;with bed alarm set Nurse Communication: Mobility status PT Visit Diagnosis: Other abnormalities of gait and mobility (R26.89)    Time: 1230-1301 PT Time Calculation (min) (ACUTE ONLY): 31 min   Charges:  PT Evaluation $PT Eval Low Complexity: 1 Low PT Treatments $Gait Training: 8-22 mins        Abran Richard, PT Acute Rehab Services Pager [phone] [facility] 11/15/2021, 1:34 PM

## 2021-11-15 NOTE — Telephone Encounter (Signed)
Monitor enrolled to be mailed to patients home.

## 2021-11-15 NOTE — TOC Progression Note (Signed)
Transition of Care [facility]) - Progression Note    Patient Details  Name: Lynn [name] MRN: [id number] Date of Birth: 04-Feb-1956  [facility] CM/SW Contact  Purcell Mouton, RN Phone Number: 11/15/2021, 1:52 PM  Clinical Narrative:    Pt states there are no HH needs of DME at present time.         Expected Discharge Plan and Services           Expected Discharge Date: 11/15/21                                     Social Determinants of Health (SDOH) Interventions    Readmission Risk Interventions No flowsheet data found.

## 2021-11-15 NOTE — Discharge Summary (Signed)
Physician Discharge Summary  Zerenity Bowron GUR:[id number] DOB: [birth year] DOA: 11/13/2021  PCP: [name], MD  Admit date: 11/13/2021  Discharge date: 11/15/2021  Admitted From: Home  Disposition:  Home.  Recommendations for Outpatient Follow-up:  Follow up with PCP in 1-2 weeks. Please obtain BMP/CBC in one week. Advised to follow-up with cardiology as scheduled. Patient presented with syncopal episode likely vasovagal given diuretic use. Advised to resume diuretics at the lower dose.  Home Health: None Equipment/Devices: None  Discharge Condition: Stable CODE STATUS:Full code Diet recommendation: Heart Healthy  Brief [place] Health Course: This 66 years old female with PMH significant for CVA, hypertension, CAD, paroxysmal A. fib on eliquis, chronic diastolic heart failure with mitral valve stenosis and calcified mitral valve mass, insulin-dependent type 2 diabetes, rheumatoid arthritis, COPD, ILD on 3 L of supplemental oxygen at baseline presented in the ED s/p syncope.  Patient reports for last 10 days she has been having episodes where she would break out in  sweat and had a headache and nausea and vomiting.  Yesterday while cooking she became suddenly diaphoretic,  developed pain between shoulder blades and dizzy and passed out.  She also reported worsening shortness of breath requiring 4 L of supplemental oxygen which is higher than her baseline.  Patient was recently seen by cardiology and started on Eliquis for atrial fibrillation. Patient has been advised to take Lasix 20 mg daily,  instead she has been taking 40 mg daily in addition to hydrochlorothiazide. Patient was admitted for syncope possible vasovagal due to higher diuretic use..Cardiology consulted,  recommended to discontinue Lasix and obtain echocardiogram.  Echocardiogram shows LVEF 60 to 65%.  Patient is cleared from cardiology to be discharged on Zio patch to evaluate for arrhythmias. Patient feels  better and want to be discharged.  She was managed for blood problems.  Discharge Diagnoses:  Principal Problem:   Syncope Active Problems:   Essential hypertension   Chronic diastolic CHF (congestive heart failure) (HCC)   Moderate mitral stenosis by prior echocardiography   Type 2 diabetes mellitus with hyperglycemia, with long-term current use of insulin (HCC)   Anemia   PAF (paroxysmal atrial fibrillation) (HCC); CHA2DS2-VASc score 7   Acute on chronic respiratory failure with hypoxia (HCC)  Syncope: Likely vasovagal.  Possible overdiuresis with subsequent dehydration. Patient has been taking 40 mg of Lasix and hydrochlorothiazide. Patient reported recurrent episodes of diaphoresis with headache, nausea, vomiting associated palpitation. Repeat echocardiogram shows LVEF 60 to 65%.  Severe mitral valve stenosis with calcified mass Cardiology consulted, obtain 2D echocardiogram. Hold on Lasix.  Continue telemetry. she will need Zio patch at discharge.   Acute on chronic hypoxic respiratory failure: Patient does have a history of COPD and ILD. At baseline 2 to 3 L of supplemental oxygen. Chest x-ray negative.Not in any acute exacerbation. Echocardiogram shows LVEF 60 to 65%    Acute kidney injury: > Resolved. Creatinine 1.8 up from 1.4 from baseline. Avoid nephrotoxic medication.   Chronic diastolic heart failure: Appears compensated. Continue to hold Lasix.   Paroxysmal atrial fibrillation: Continue Eliquis, continue diltiazem.   Essential hypertension: Continue Cardizem, hold Lasix and ACE I until renal function improves.   Rheumatoid arthritis: Continue leflunomide and chronic prednisone   History of CVA: Continue Eliquis   Type 2 diabetes: Continue resistant sliding scale.      Discharge Instructions  Discharge Instructions     Call MD for:  difficulty breathing, headache or visual disturbances   Complete by: As directed  Call MD for:  persistant  dizziness or light-headedness   Complete by: As directed    Call MD for:  persistant nausea and vomiting   Complete by: As directed    Diet - low sodium heart healthy   Complete by: As directed    Diet Carb Modified   Complete by: As directed    Discharge instructions   Complete by: As directed    Advised to follow-up with primary care physician in 1 week. Advised to follow-up with cardiology as scheduled. Patient presented with syncopal episode likely vasovagal given diuretic use. Advised to resume diuretics at the lower dose.   Increase activity slowly   Complete by: As directed       Allergies as of 11/15/2021       Reactions   Penicillins Hives   Childhood allergy Has patient had a PCN reaction causing immediate rash, facial/tongue/throat swelling, SOB or lightheadedness with hypotension: Yes Has patient had a PCN reaction causing severe rash involving mucus membranes or skin necrosis: No Has patient had a PCN reaction that required hospitalization No Has patient had a PCN reaction occurring within the last 10 years: No If all of the above answers are "NO", then may proceed with Cephalosporin use.   Metformin And Related Other (See Comments)   Diarrhea, bleeding   Farxiga [dapagliflozin] Other (See Comments)   Dizzy and lethargic        Medication List     STOP taking these medications    hydrochlorothiazide 12.5 MG tablet Commonly known as: HYDRODIURIL   Victoza 18 MG/3ML Sopn Generic drug: liraglutide       TAKE these medications    acetaminophen 500 MG tablet Commonly known as: TYLENOL Take 500 mg by mouth every 6 (six) hours as needed for fever.   acetaminophen-codeine 300-30 MG tablet Commonly known as: TYLENOL #3 Take 1-2 tablets by mouth every 8 (eight) hours as needed for moderate pain.   albuterol 108 (90 Base) MCG/ACT inhaler Commonly known as: VENTOLIN HFA Inhale 2 puffs into the lungs every 6 (six) hours as needed for wheezing or shortness  of breath.   apixaban 5 MG Tabs tablet Commonly known as: ELIQUIS Take 1 tablet (5 mg total) by mouth 2 (two) times daily.   atorvastatin 80 MG tablet Commonly known as: LIPITOR TAKE 1 TABLET(80 MG) BY MOUTH DAILY AT 6 PM What changed:  how much to take how to take this when to take this additional instructions   B-D UF III MINI PEN NEEDLES 31G X 5 MM Misc Generic drug: Insulin Pen Needle USE DAILY WITH VICTOZA AND LANTUS.   Insulin Pen Needle 31G X 5 MM Misc 1 Device by Does not apply route 3 (three) times daily.   cetirizine 10 MG tablet Commonly known as: ZYRTEC Take 10 mg by mouth daily as needed for allergies.   Contour Next Test test strip Generic drug: glucose blood 3x daily   Dexcom G6 Sensor Misc 1 Device by Does not apply route as directed.   Dexcom G6 Transmitter Misc 1 Device by Does not apply route as directed.   diltiazem 360 MG 24 hr capsule Commonly known as: CARDIZEM CD Take 1 capsule (360 mg total) by mouth daily.   folic acid 1 MG tablet Commonly known as: FOLVITE Take 2 tablets (2 mg total) by mouth daily. What changed:  how much to take when to take this   furosemide 20 MG tablet Commonly known as: Lasix Decrease Lasix ( furosemide)  to 20 mg ( 1/2/tablet)  , with 20 mg  you may take a an additional 20 mg if need for swelling or weight gain more than 3 lbs overnight or 5 lbs in one week -- if you have continue to take an additional 20 mg , go back to 40 mg  Take additional dose as needed for egt gain> 3 lb in 1 day What changed: medication strength   insulin lispro 100 UNIT/ML injection Commonly known as: HUMALOG Max Daily 100 units per pump   Lancets 30G Misc 1 Device by Does not apply route 3 (three) times daily.   leflunomide 20 MG tablet Commonly known as: ARAVA Take 20 mg by mouth daily.   losartan 50 MG tablet Commonly known as: COZAAR Take 1 tablet (50 mg total) by mouth daily.   Melatonin 5 MG Chew Chew 5 mg by mouth at  bedtime.   multivitamin with minerals Tabs tablet Take 1 tablet by mouth daily.   Omnipod DASH Pods (Gen 4) Misc Inject 1 Dose into the skin continuous. Using pump - depends on carb intake.   pantoprazole 40 MG tablet Commonly known as: PROTONIX Take 1 tablet (40 mg total) by mouth 2 (two) times daily before a meal.   predniSONE 5 MG tablet Commonly known as: DELTASONE Take 5 mg by mouth daily.   SAFETY-LOK TB SYRINGE 27GX.5" 27G X 1/2" 1 ML Misc Generic drug: TUBERCULIN SYR 1CC/27GX1/2"   Salonpas Pain Relief Patch Ptch Apply 1 patch topically daily as needed (pain).   Semaglutide (2 MG/DOSE) 8 MG/3ML Sopn Inject 2 mg as directed once a week.   traZODone 50 MG tablet Commonly known as: DESYREL TAKE 0.5-1 TABLETS BY MOUTH AT BEDTIME AS NEEDED FOR SLEEP. What changed: See the new instructions.   VICKS SINEX NA Place 1 spray into the nose daily as needed (congestion).        Follow-up Information     Copland, Gay Filler, MD Follow up in 1 week(s).   Specialty: Family Medicine Contact information: [place] [address] [phone]         Julian Follow up.   Specialty: Cardiology Why: [facility] - cardiology office will be mailing you a heart monitor to wear for 2 weeks. Please call the number on the box to the [employer] or Dr. Allison Quarry office if you have any questions. Contact information: [address] [place] [phone]        [name], PA Follow up.   Specialties: Physician Assistant, Cardiology, Radiology Why: [facility] location - we have scheduled a follow-up visit for you on Tuesday Dec 26, 2021 at 10:05 AM (Arrive by 9:50 AM). Janace Hoard Duke is one of the PAs that works with Dr. Ellyn Hack whom you've met before. Contact information: [address] [place] [address] [phone]                Allergies  Allergen  Reactions   Penicillins Hives    Childhood allergy Has patient had a PCN reaction causing immediate rash, facial/tongue/throat swelling, SOB or lightheadedness with hypotension: Yes Has patient had a PCN reaction causing severe rash involving mucus membranes or skin necrosis: No Has patient had a PCN reaction that required hospitalization No Has patient had a PCN reaction occurring within the last 10 years: No If all of the above answers are "NO", then may proceed with Cephalosporin use.   Metformin And  Related Other (See Comments)    Diarrhea, bleeding   Farxiga [Dapagliflozin] Other (See Comments)    Dizzy and lethargic     Consultations: Cardiology   Procedures/Studies: DG CHEST PORT 1 VIEW  Result Date: 11/14/2021 CLINICAL DATA:  Dyspnea, syncope, hypotension EXAM: PORTABLE CHEST 1 VIEW COMPARISON:  04/26/2021 FINDINGS: Lungs are clear.  No pleural effusion or pneumothorax. The heart is top-normal in size. IMPRESSION: No evidence of acute cardiopulmonary disease. Electronically Signed   By: Julian Hy M.D.   On: 11/14/2021 02:20   ECHOCARDIOGRAM COMPLETE  Result Date: 11/14/2021    ECHOCARDIOGRAM REPORT   Patient Name:   MARILYNNE DUPUIS Date of Exam: 11/14/2021 Medical Rec #:  [id number]               Height:       63.0 in Accession #:    [id number]              Weight:       248.0 lb Date of Birth:  1956/02/12                BSA:          2.119 m Patient Age:    5 years                BP:           147/61 mmHg Patient Gender: F                       HR:           70 bpm. Exam Location:  Inpatient Procedure: 2D Echo Indications:    Syncope  History:        Patient has prior history of Echocardiogram examinations, most                 recent 03/24/2021. CAD; Risk Factors:Dyslipidemia and                 Hypertension. Mitral stenosis.  Sonographer:    Arlyss Gandy Referring Phys: [id number] [place] T TU  Sonographer Comments: Patient is morbidly obese. Image acquisition  challenging due to patient body habitus. IMPRESSIONS  1. Left ventricular ejection fraction, by estimation, is 60 to 65%. The left ventricle has normal function. The left ventricle has no regional wall motion abnormalities. There is severe left ventricular hypertrophy. Left ventricular diastolic parameters  are consistent with Grade I diastolic dysfunction (impaired relaxation).  2. Right ventricular systolic function is normal. The right ventricular size is normal.  3. The pericardial effusion is circumferential.  4. Severe MAC has not changed since TEE. The mitral valve is degenerative. No evidence of mitral valve regurgitation. Moderate to severe mitral stenosis. The mean mitral valve gradient is 7.5 mmHg. Severe mitral annular calcification.  5. The aortic valve is normal in structure. Aortic valve regurgitation is not visualized. No aortic stenosis is present.  6. The inferior vena cava is normal in size with greater than 50% respiratory variability, suggesting right atrial pressure of 3 mmHg. FINDINGS  Left Ventricle: Left ventricular ejection fraction, by estimation, is 60 to 65%. The left ventricle has normal function. The left ventricle has no regional wall motion abnormalities. The left ventricular internal cavity size was normal in size. There is  severe left ventricular hypertrophy. Left ventricular diastolic parameters are consistent with Grade I diastolic dysfunction (impaired relaxation). Right Ventricle: The right ventricular size is normal. No increase in right ventricular wall thickness.  Right ventricular systolic function is normal. Left Atrium: Left atrial size was normal in size. Right Atrium: Right atrial size was normal in size. Pericardium: Trivial pericardial effusion is present. The pericardial effusion is circumferential. Mitral Valve: Severe MAC has not changed since TEE. The mitral valve is degenerative in appearance. There is severe thickening of the mitral valve leaflet(s). There is  severe calcification of the mitral valve leaflet(s). Severe mitral annular calcification. No evidence of mitral valve regurgitation. Moderate to severe mitral valve stenosis. MV peak gradient, 15.1 mmHg. The mean mitral valve gradient is 7.5 mmHg. Tricuspid Valve: The tricuspid valve is normal in structure. Tricuspid valve regurgitation is not demonstrated. No evidence of tricuspid stenosis. Aortic Valve: The aortic valve is normal in structure. Aortic valve regurgitation is not visualized. No aortic stenosis is present. Aortic valve mean gradient measures 5.5 mmHg. Aortic valve peak gradient measures 10.8 mmHg. Aortic valve area, by VTI measures 2.57 cm. Pulmonic Valve: The pulmonic valve was normal in structure. Pulmonic valve regurgitation is not visualized. No evidence of pulmonic stenosis. Aorta: The aortic root is normal in size and structure. Venous: The inferior vena cava is normal in size with greater than 50% respiratory variability, suggesting right atrial pressure of 3 mmHg. IAS/Shunts: No atrial level shunt detected by color flow Doppler.  LEFT VENTRICLE PLAX 2D LVIDd:         2.60 cm   Diastology LVIDs:         1.70 cm   LV e' medial:    4.03 cm/s LV PW:         1.80 cm   LV E/e' medial:  42.7 LV IVS:        1.60 cm   LV e' lateral:   5.66 cm/s LVOT diam:     2.00 cm   LV E/e' lateral: 30.4 LV SV:         85 LV SV Index:   40 LVOT Area:     3.14 cm  RIGHT VENTRICLE             IVC RV Basal diam:  3.30 cm     IVC diam: 1.70 cm RV Mid diam:    2.50 cm RV S prime:     15.70 cm/s TAPSE (M-mode): 1.9 cm LEFT ATRIUM             Index        RIGHT ATRIUM           Index LA diam:        4.20 cm 1.98 cm/m   RA Area:     13.00 cm LA Vol (A2C):   60.1 ml 28.37 ml/m  RA Volume:   25.30 ml  11.94 ml/m LA Vol (A4C):   61.5 ml 29.03 ml/m LA Biplane Vol: 60.8 ml 28.70 ml/m  AORTIC VALVE AV Area (Vmax):    2.16 cm AV Area (Vmean):   2.25 cm AV Area (VTI):     2.57 cm AV Vmax:           164.50 cm/s AV Vmean:           105.500 cm/s AV VTI:            0.330 m AV Peak Grad:      10.8 mmHg AV Mean Grad:      5.5 mmHg LVOT Vmax:         113.00 cm/s LVOT Vmean:        75.600 cm/s LVOT VTI:  0.270 m LVOT/AV VTI ratio: 0.82  AORTA Ao Root diam: 2.90 cm Ao Asc diam:  2.90 cm MITRAL VALVE MV Area (PHT): 1.67 cm     SHUNTS MV Area VTI:   1.16 cm     Systemic VTI:  0.27 m MV Peak grad:  15.1 mmHg    Systemic Diam: 2.00 cm MV Mean grad:  7.5 mmHg MV Vmax:       1.94 m/s MV Vmean:      128.0 cm/s MV Decel Time: 454 msec MV E velocity: 172.00 cm/s MV A velocity: 185.00 cm/s MV E/A ratio:  0.93 Candee Furbish MD Electronically signed by Candee Furbish MD Signature Date/Time: 11/14/2021/10:57:08 AM    Final       Subjective: Patient was seen and examined at bedside.  Overnight events noted.   Patient reports feeling better,  denies any further dizziness.  Patient is being discharged.  Discharge Exam: Vitals:   11/15/21 0519 11/15/21 0943  BP: 137/66 131/70  Pulse: 75 85  Resp: 18 18  Temp: 98.7 F (37.1 C) (!) 97.5 F (36.4 C)  SpO2: 98% 100%   Vitals:   11/14/21 1940 11/15/21 0121 11/15/21 0519 11/15/21 0943  BP: (!) 153/71 (!) 119/58 137/66 131/70  Pulse: 75 91 75 85  Resp: _0 Temp: 98.3 F (36.8 C) 97.6 F (36.4 C) 98.7 F (37.1 C) (!) 97.5 F (36.4 C)  TempSrc: Oral Oral Oral Oral  SpO2: 100% 99% 98% 100%  Weight: 113.1 kg  110.7 kg   Height: _1  (1.6 m)       General: Pt is alert, awake, not in acute distress Cardiovascular: RRR, S1/S2 +, no rubs, no gallops Respiratory: CTA bilaterally, no wheezing, no rhonchi Abdominal: Soft, NT, ND, bowel sounds + Extremities: no edema, no cyanosis    The results of significant diagnostics from this hospitalization (including imaging, microbiology, ancillary and laboratory) are listed below for reference.     Microbiology: Recent Results (from the past 240 hour(s))  Resp Panel by RT-PCR (Flu A&B, Covid) Nasopharyngeal Swab     Status:  None   Collection Time: 11/13/21 10:00 PM   Specimen: Nasopharyngeal Swab; Nasopharyngeal(NP) swabs in vial transport medium  Result Value Ref Range Status   SARS Coronavirus 2 by RT PCR NEGATIVE NEGATIVE Final    Comment: (NOTE) SARS-CoV-2 target nucleic acids are NOT DETECTED.  The SARS-CoV-2 RNA is generally detectable in upper respiratory specimens during the acute phase of infection. The lowest concentration of SARS-CoV-2 viral copies this assay can detect is 138 copies/mL. A negative result does not preclude SARS-Cov-2 infection and should not be used as the sole basis for treatment or other patient management decisions. A negative result may occur with  improper specimen collection/handling, submission of specimen other than nasopharyngeal swab, presence of viral mutation(s) within the areas targeted by this assay, and inadequate number of viral copies(<138 copies/mL). A negative result must be combined with clinical observations, patient history, and epidemiological information. The expected result is Negative.  Fact Sheet for Patients:  EntrepreneurPulse.com.au  Fact Sheet for Healthcare Providers:  IncredibleEmployment.be  This test is no t yet approved or cleared by the [place] FDA and  has been authorized for detection and/or diagnosis of SARS-CoV-2 by FDA under an Emergency Use Authorization (EUA). This EUA will remain  in effect (meaning this test can be used) for the duration of the COVID-19 declaration under Section 564(b)(1) of the Act, 21 U.S.C.section 360bbb-3(b)(1), unless the  authorization is terminated  or revoked sooner.       Influenza A by PCR NEGATIVE NEGATIVE Final   Influenza B by PCR NEGATIVE NEGATIVE Final    Comment: (NOTE) The Xpert Xpress SARS-CoV-2/FLU/RSV plus assay is intended as an aid in the diagnosis of influenza from Nasopharyngeal swab specimens and should not be used as a sole basis for  treatment. Nasal washings and aspirates are unacceptable for Xpert Xpress SARS-CoV-2/FLU/RSV testing.  Fact Sheet for Patients: EntrepreneurPulse.com.au  Fact Sheet for Healthcare Providers: IncredibleEmployment.be  This test is not yet approved or cleared by the [place] FDA and has been authorized for detection and/or diagnosis of SARS-CoV-2 by FDA under an Emergency Use Authorization (EUA). This EUA will remain in effect (meaning this test can be used) for the duration of the COVID-19 declaration under Section 564(b)(1) of the Act, 21 U.S.C. section 360bbb-3(b)(1), unless the authorization is terminated or revoked.  Performed at [facility], [place] [address] [place], [place] [address]      Labs: BNP (last 3 results) Recent Labs    03/26/21 1457 11/13/21 1924  BNP 152.5* 44.9   Basic Metabolic Panel: Recent Labs  Lab 11/13/21 1924 11/14/21 0350 11/15/21 0358  NA 133* 131* 135  K 3.5 3.5 3.5  CL 91* 91* 96*  CO2 30 29 32  GLUCOSE 230* 240* 201*  BUN 33* 26* 19  CREATININE 1.86* 1.35* 1.03*  CALCIUM 9.1 8.7* 8.9  MG  --   --  2.2  PHOS  --   --  2.4*   Liver Function Tests: Recent Labs  Lab 11/13/21 1924  AST 19  ALT 17  ALKPHOS 85  BILITOT 0.5  PROT 8.2*  ALBUMIN 4.1   No results for input(s): LIPASE, AMYLASE in the last 168 hours. No results for input(s): AMMONIA in the last 168 hours. CBC: Recent Labs  Lab 11/13/21 1924 11/14/21 0350 11/15/21 0358  WBC 12.6* 9.0 6.7  NEUTROABS 10.3*  --   --   HGB 10.1* 8.5* 8.3*  HCT 32.9* 27.7* 28.2*  MCV 61.6* 62.0* 63.1*  PLT 266 222 210   Cardiac Enzymes: No results for input(s): CKTOTAL, CKMB, CKMBINDEX, TROPONINI in the last 168 hours. BNP: Invalid input(s): POCBNP CBG: Recent Labs  Lab 11/14/21 2007 11/15/21 0114 11/15/21 0408 11/15/21 0734 11/15/21 1147  GLUCAP 161* 238* 189* 145* 185*   D-Dimer No results for input(s):  DDIMER in the last 72 hours. Hgb A1c Recent Labs    11/14/21 0350  HGBA1C 8.6*   Lipid Profile No results for input(s): CHOL, HDL, LDLCALC, TRIG, CHOLHDL, LDLDIRECT in the last 72 hours. Thyroid function studies Recent Labs    11/14/21 0350  TSH 2.280   Anemia work up No results for input(s): VITAMINB12, FOLATE, FERRITIN, TIBC, IRON, RETICCTPCT in the last 72 hours. Urinalysis    Component Value Date/Time   COLORURINE YELLOW 03/23/2021 1732   APPEARANCEUR HAZY (A) 03/23/2021 1732   LABSPEC 1.017 03/23/2021 1732   PHURINE 5.0 03/23/2021 1732   GLUCOSEU NEGATIVE 03/23/2021 1732   HGBUR SMALL (A) 03/23/2021 1732   BILIRUBINUR NEGATIVE 03/23/2021 1732   KETONESUR NEGATIVE 03/23/2021 1732   PROTEINUR NEGATIVE 03/23/2021 1732   UROBILINOGEN 0.2 08/18/2008 1535   NITRITE NEGATIVE 03/23/2021 1732   LEUKOCYTESUR TRACE (A) 03/23/2021 1732   Sepsis Labs Invalid input(s): PROCALCITONIN,  WBC,  LACTICIDVEN Microbiology Recent Results (from the past 240 hour(s))  Resp Panel by RT-PCR (Flu A&B, Covid) Nasopharyngeal Swab  Status: None   Collection Time: 11/13/21 10:00 PM   Specimen: Nasopharyngeal Swab; Nasopharyngeal(NP) swabs in vial transport medium  Result Value Ref Range Status   SARS Coronavirus 2 by RT PCR NEGATIVE NEGATIVE Final    Comment: (NOTE) SARS-CoV-2 target nucleic acids are NOT DETECTED.  The SARS-CoV-2 RNA is generally detectable in upper respiratory specimens during the acute phase of infection. The lowest concentration of SARS-CoV-2 viral copies this assay can detect is 138 copies/mL. A negative result does not preclude SARS-Cov-2 infection and should not be used as the sole basis for treatment or other patient management decisions. A negative result may occur with  improper specimen collection/handling, submission of specimen other than nasopharyngeal swab, presence of viral mutation(s) within the areas targeted by this assay, and inadequate number of  viral copies(<138 copies/mL). A negative result must be combined with clinical observations, patient history, and epidemiological information. The expected result is Negative.  Fact Sheet for Patients:  EntrepreneurPulse.com.au  Fact Sheet for Healthcare Providers:  IncredibleEmployment.be  This test is no t yet approved or cleared by the [place] FDA and  has been authorized for detection and/or diagnosis of SARS-CoV-2 by FDA under an Emergency Use Authorization (EUA). This EUA will remain  in effect (meaning this test can be used) for the duration of the COVID-19 declaration under Section 564(b)(1) of the Act, 21 U.S.C.section 360bbb-3(b)(1), unless the authorization is terminated  or revoked sooner.       Influenza A by PCR NEGATIVE NEGATIVE Final   Influenza B by PCR NEGATIVE NEGATIVE Final    Comment: (NOTE) The Xpert Xpress SARS-CoV-2/FLU/RSV plus assay is intended as an aid in the diagnosis of influenza from Nasopharyngeal swab specimens and should not be used as a sole basis for treatment. Nasal washings and aspirates are unacceptable for Xpert Xpress SARS-CoV-2/FLU/RSV testing.  Fact Sheet for Patients: EntrepreneurPulse.com.au  Fact Sheet for Healthcare Providers: IncredibleEmployment.be  This test is not yet approved or cleared by the [place] FDA and has been authorized for detection and/or diagnosis of SARS-CoV-2 by FDA under an Emergency Use Authorization (EUA). This EUA will remain in effect (meaning this test can be used) for the duration of the COVID-19 declaration under Section 564(b)(1) of the Act, 21 U.S.C. section 360bbb-3(b)(1), unless the authorization is terminated or revoked.  Performed at [facility], [place] [address] [place], [place] [address]      Time coordinating discharge: Over 30 minutes  SIGNED:   Shawna Clamp, MD  [facility] 11/15/2021, 4:35 PM Pager   If 7PM-7AM, please contact night-coverage

## 2021-11-15 NOTE — Progress Notes (Signed)
Progress Note  Patient Name: Lynn [name] Date of Encounter: 11/15/2021  [facility] Cardiologist: Glenetta Hew, MD   Subjective   Cr improved to 1.0.  BP 131/70 this morning.  No chest pain, dyspnea, or lightheadedness  Inpatient Medications    Scheduled Meds:  apixaban  5 mg Oral BID   atorvastatin  80 mg Oral QPM   diltiazem  360 mg Oral Daily   folic acid  1 mg Oral BID   hydrALAZINE  25 mg Oral Q8H   insulin aspart  0-20 Units Subcutaneous Q4H   insulin glargine-yfgn  15 Units Subcutaneous BID   leflunomide  20 mg Oral Daily   mouth rinse  15 mL Mouth Rinse BID   pantoprazole  40 mg Oral BID AC   sodium chloride flush  3 mL Intravenous Q12H   Continuous Infusions:  PRN Meds: acetaminophen, albuterol, traZODone   Vital Signs    Vitals:   11/14/21 1940 11/15/21 0121 11/15/21 0519 11/15/21 0943  BP: (!) 153/71 (!) 119/58 137/66 131/70  Pulse: 75 91 75 85  Resp: _0 Temp: 98.3 F (36.8 C) 97.6 F (36.4 C) 98.7 F (37.1 C) (!) 97.5 F (36.4 C)  TempSrc: Oral Oral Oral Oral  SpO2: 100% 99% 98% 100%  Weight: 113.1 kg  110.7 kg   Height: _1  (1.6 m)       Intake/Output Summary (Last 24 hours) at 11/15/2021 1142 Last data filed at 11/15/2021 1046 Gross per 24 hour  Intake 600 ml  Output 2400 ml  Net -1800 ml   Last 3 Weights 11/15/2021 11/14/2021 11/13/2021  Weight (lbs) 244 lb 1.6 oz 249 lb 5.4 oz 248 lb  Weight (kg) 110.723 kg 113.1 kg 112.492 kg      Telemetry    NSR - Personally Reviewed  ECG    No new ECG - Personally Reviewed  Physical Exam   GEN: No acute distress.   Neck: No JVD Cardiac: RRR, no murmurs, rubs, or gallops.  Respiratory: Clear to auscultation bilaterally. GI: Soft, nontender, non-distended  MS: No edema; No deformity. Neuro:  Nonfocal  Psych: Normal affect   Labs    High Sensitivity Troponin:   Recent Labs  Lab 11/13/21 1924 11/13/21 2340  TROPONINIHS 21* 24*     Chemistry Recent Labs   Lab 11/13/21 1924 11/14/21 0350 11/15/21 0358  NA 133* 131* 135  K 3.5 3.5 3.5  CL 91* 91* 96*  CO2 30 29 32  GLUCOSE 230* 240* 201*  BUN 33* 26* 19  CREATININE 1.86* 1.35* 1.03*  CALCIUM 9.1 8.7* 8.9  MG  --   --  2.2  PROT 8.2*  --   --   ALBUMIN 4.1  --   --   AST 19  --   --   ALT 17  --   --   ALKPHOS 85  --   --   BILITOT 0.5  --   --   GFRNONAA 29* 43* 60*  ANIONGAP _2 Lipids No results for input(s): CHOL, TRIG, HDL, LABVLDL, LDLCALC, CHOLHDL in the last 168 hours.  Hematology Recent Labs  Lab 11/13/21 1924 11/14/21 0350 11/15/21 0358  WBC 12.6* 9.0 6.7  RBC 5.34* 4.47 4.47  HGB 10.1* 8.5* 8.3*  HCT 32.9* 27.7* 28.2*  MCV 61.6* 62.0* 63.1*  MCH 18.9* 19.0* 18.6*  MCHC 30.7 30.7 29.4*  RDW 17.5* 17.2* 17.2*  PLT 266 222 210  Thyroid  Recent Labs  Lab 11/14/21 0350  TSH 2.280    BNP Recent Labs  Lab 11/13/21 1924  BNP 90.8    DDimer No results for input(s): DDIMER in the last 168 hours.   Radiology    DG CHEST PORT 1 VIEW  Result Date: 11/14/2021 CLINICAL DATA:  Dyspnea, syncope, hypotension EXAM: PORTABLE CHEST 1 VIEW COMPARISON:  04/26/2021 FINDINGS: Lungs are clear.  No pleural effusion or pneumothorax. The heart is top-normal in size. IMPRESSION: No evidence of acute cardiopulmonary disease. Electronically Signed   By: Julian Hy M.D.   On: 11/14/2021 02:20   ECHOCARDIOGRAM COMPLETE  Result Date: 11/14/2021    ECHOCARDIOGRAM REPORT   Patient Name:   Lynn [name] Date of Exam: 11/14/2021 Medical Rec #:  [id number]               Height:       63.0 in Accession #:    [id number]              Weight:       248.0 lb Date of Birth:  [birth year]                BSA:          2.119 m Patient Age:    66 years                BP:           147/61 mmHg Patient Gender: F                       HR:           70 bpm. Exam Location:  Inpatient Procedure: 2D Echo Indications:    Syncope  History:        Patient has prior history of  Echocardiogram examinations, most                 recent 03/24/2021. CAD; Risk Factors:Dyslipidemia and                 Hypertension. Mitral stenosis.  Sonographer:    Arlyss Gandy Referring Phys: [id number] [place] T TU  Sonographer Comments: Patient is morbidly obese. Image acquisition challenging due to patient body habitus. IMPRESSIONS  1. Left ventricular ejection fraction, by estimation, is 60 to 65%. The left ventricle has normal function. The left ventricle has no regional wall motion abnormalities. There is severe left ventricular hypertrophy. Left ventricular diastolic parameters  are consistent with Grade I diastolic dysfunction (impaired relaxation).  2. Right ventricular systolic function is normal. The right ventricular size is normal.  3. The pericardial effusion is circumferential.  4. Severe MAC has not changed since TEE. The mitral valve is degenerative. No evidence of mitral valve regurgitation. Moderate to severe mitral stenosis. The mean mitral valve gradient is 7.5 mmHg. Severe mitral annular calcification.  5. The aortic valve is normal in structure. Aortic valve regurgitation is not visualized. No aortic stenosis is present.  6. The inferior vena cava is normal in size with greater than 50% respiratory variability, suggesting right atrial pressure of 3 mmHg. FINDINGS  Left Ventricle: Left ventricular ejection fraction, by estimation, is 60 to 65%. The left ventricle has normal function. The left ventricle has no regional wall motion abnormalities. The left ventricular internal cavity size was normal in size. There is  severe left ventricular hypertrophy. Left ventricular diastolic parameters are consistent with Grade I diastolic dysfunction (impaired relaxation). Right Ventricle:  The right ventricular size is normal. No increase in right ventricular wall thickness. Right ventricular systolic function is normal. Left Atrium: Left atrial size was normal in size. Right Atrium: Right atrial size was  normal in size. Pericardium: Trivial pericardial effusion is present. The pericardial effusion is circumferential. Mitral Valve: Severe MAC has not changed since TEE. The mitral valve is degenerative in appearance. There is severe thickening of the mitral valve leaflet(s). There is severe calcification of the mitral valve leaflet(s). Severe mitral annular calcification. No evidence of mitral valve regurgitation. Moderate to severe mitral valve stenosis. MV peak gradient, 15.1 mmHg. The mean mitral valve gradient is 7.5 mmHg. Tricuspid Valve: The tricuspid valve is normal in structure. Tricuspid valve regurgitation is not demonstrated. No evidence of tricuspid stenosis. Aortic Valve: The aortic valve is normal in structure. Aortic valve regurgitation is not visualized. No aortic stenosis is present. Aortic valve mean gradient measures 5.5 mmHg. Aortic valve peak gradient measures 10.8 mmHg. Aortic valve area, by VTI measures 2.57 cm. Pulmonic Valve: The pulmonic valve was normal in structure. Pulmonic valve regurgitation is not visualized. No evidence of pulmonic stenosis. Aorta: The aortic root is normal in size and structure. Venous: The inferior vena cava is normal in size with greater than 50% respiratory variability, suggesting right atrial pressure of 3 mmHg. IAS/Shunts: No atrial level shunt detected by color flow Doppler.  LEFT VENTRICLE PLAX 2D LVIDd:         2.60 cm   Diastology LVIDs:         1.70 cm   LV e' medial:    4.03 cm/s LV PW:         1.80 cm   LV E/e' medial:  42.7 LV IVS:        1.60 cm   LV e' lateral:   5.66 cm/s LVOT diam:     2.00 cm   LV E/e' lateral: 30.4 LV SV:         85 LV SV Index:   40 LVOT Area:     3.14 cm  RIGHT VENTRICLE             IVC RV Basal diam:  3.30 cm     IVC diam: 1.70 cm RV Mid diam:    2.50 cm RV S prime:     15.70 cm/s TAPSE (M-mode): 1.9 cm LEFT ATRIUM             Index        RIGHT ATRIUM           Index LA diam:        4.20 cm 1.98 cm/m   RA Area:     13.00 cm  LA Vol (A2C):   60.1 ml 28.37 ml/m  RA Volume:   25.30 ml  11.94 ml/m LA Vol (A4C):   61.5 ml 29.03 ml/m LA Biplane Vol: 60.8 ml 28.70 ml/m  AORTIC VALVE AV Area (Vmax):    2.16 cm AV Area (Vmean):   2.25 cm AV Area (VTI):     2.57 cm AV Vmax:           164.50 cm/s AV Vmean:          105.500 cm/s AV VTI:            0.330 m AV Peak Grad:      10.8 mmHg AV Mean Grad:      5.5 mmHg LVOT Vmax:         113.00 cm/s  LVOT Vmean:        75.600 cm/s LVOT VTI:          0.270 m LVOT/AV VTI ratio: 0.82  AORTA Ao Root diam: 2.90 cm Ao Asc diam:  2.90 cm MITRAL VALVE MV Area (PHT): 1.67 cm     SHUNTS MV Area VTI:   1.16 cm     Systemic VTI:  0.27 m MV Peak grad:  15.1 mmHg    Systemic Diam: 2.00 cm MV Mean grad:  7.5 mmHg MV Vmax:       1.94 m/s MV Vmean:      128.0 cm/s MV Decel Time: 454 msec MV E velocity: 172.00 cm/s MV A velocity: 185.00 cm/s MV E/A ratio:  0.93 Candee Furbish MD Electronically signed by Candee Furbish MD Signature Date/Time: 11/14/2021/10:57:08 AM    Final     Cardiac Studies     Patient Profile     66 y.o. 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 Dwyane Dee for evaluation of syncope  Assessment & Plan    Syncope: Description suggest vasovagal syncope given her prodromal symptoms.  Also with lightheadedness with standing, suspect may have occurred in setting of dehydration from her diuretics.  She was post be taking 20 mg daily of Lasix but had been taking 40 mg.  Echocardiogram shows EF 60 to 65%, severe LVH, grade 1 diastolic dysfunction, normal RV function, moderate to severe mitral stenosis. -Plan Zio patch x2 weeks at discharge  AKI on CKD: Baseline creatinine 1-1.2, was up to 1.86 on admission.  Improved with IV  fluids and holding diuretics.  Creatinine 1.0 today.   Chronic HFpEF: She was taking Lasix 40 mg daily and hydrochlorothiazide 12.5 mg daily at home.  She was post be taking only 20 mg daily of Lasix but had been taking 40 mg -Hold HCTZ and Lasix for now.  Recommend monitoring daily weights and kidney restart Lasix 20 mg if gains more than 3 pounds in 1 day or 5 pounds in 1 week   CAD: She had mild nonobstructive disease at cath 07/2021, no essential change even though she has continued to have chest pain felt secondary to coronary vasospasm.  Minimal troponin elevation is not consistent with ACS  Hypertension: On HCTZ 12.5 mg daily, Lasix 40 mg daily, losartan 50 mg daily, diltiazem 360 mg daily at home.   -Holding diuretics. -Continue diltiazem -Hydralazine added -Will restart home losartan given resolution of AKI  [facility] will sign off.   Medication Recommendations: Losartan 50 mg daily, diltiazem 360 mg daily, hydralazine 25 mg 3 times daily.  Take Lasix 20 mg as needed for weight gain greater than 3 pounds in 1 day or 5 pounds in 1 week.  Hold HCTZ. Other recommendations (labs, testing, etc): Zio patch x2 weeks on discharge Follow up as an outpatient: Will schedule   For questions or updates, please contact [place] Please consult [email or web address] for contact info under        Signed, Donato Heinz, MD  11/15/2021, 11:42 AM

## 2021-11-15 NOTE — Telephone Encounter (Signed)
Hi monitor team. Per Dr. Gardiner Rhyme pt needs 2 week live [facility] for syncope. Being discharged today. Monitor order placed. Pt aware will be mailed to her house. Thanks. Kaycen Whitworth PA-C

## 2021-11-15 NOTE — TOC Progression Note (Signed)
Transition of Care [facility]) - Progression Note    Patient Details  Name: Bridey Brookover MRN: [id number] Date of Birth: Nov 19, 1955  Transition of Care [facility]) CM/SW Contact  Purcell Mouton, RN Phone Number: 11/15/2021, 1:28 PM  Clinical Narrative:      Transition of Care [facility]) Screening Note   Patient Details  Name: Jamyah Folk Date of Birth: [birth year]   Transition of Care [facility]) CM/SW Contact:    Purcell Mouton, RN Phone Number: 11/15/2021, 1:28 PM    Transition of Care Department [facility]) has reviewed patient and no TOC needs have been identified at this time. We will continue to monitor patient advancement through interdisciplinary progression rounds. If new patient transition needs arise, please place a TOC consult.         Expected Discharge Plan and Services           Expected Discharge Date: 11/15/21                                     Social Determinants of Health (SDOH) Interventions    Readmission Risk Interventions No flowsheet data found.

## 2021-11-15 NOTE — Plan of Care (Signed)
°  Problem: Education: Goal: Knowledge of General Education information will improve Description: Including pain rating scale, medication(s)/side effects and non-pharmacologic comfort measures Outcome: Progressing   Problem: Clinical Measurements: Goal: Diagnostic test results will improve Outcome: Progressing Goal: Respiratory complications will improve Outcome: Progressing Goal: Cardiovascular complication will be avoided Outcome: Progressing

## 2021-11-16 ENCOUNTER — Telehealth: Payer: Self-pay

## 2021-11-16 ENCOUNTER — Telehealth: Payer: Self-pay | Admitting: Gastroenterology

## 2021-11-16 NOTE — Telephone Encounter (Signed)
Called patient to advise left voicemail. °

## 2021-11-16 NOTE — Telephone Encounter (Signed)
Patient called wanting to confirm when she is due for her colonoscopy her recall states 2027 but the op report states 5 yrs.

## 2021-11-16 NOTE — Telephone Encounter (Signed)
Transition Care Management Follow-up Telephone Call Date of discharge and from where: 11/15/2021-[place] How have you been since you were released from the hospital? Doing a lot better Any questions or concerns? No  Items Reviewed: Did the pt receive and understand the discharge instructions provided? Yes  Medications obtained and verified? Yes  Other? Yes  Any new allergies since your discharge? No  Dietary orders reviewed? Yes Do you have support at home? Yes   Home Care and Equipment/Supplies: Were home health services ordered? no If so, what is the name of the agency? N/a  Has the agency set up a time to come to the patient's home? not applicable Were any new equipment or medical supplies ordered?  No What is the name of the medical supply agency? N/a Were you able to get the supplies/equipment? not applicable Do you have any questions related to the use of the equipment or supplies? N/a  Functional Questionnaire: (I = Independent and D = Dependent) ADLs: I  Bathing/Dressing- I  Meal Prep- D  Eating- I  Maintaining continence- I  Transferring/Ambulation- I  Managing Meds- I WITH ASSISTANCE  Follow up appointments reviewed:  PCP Hospital f/u appt confirmed? Yes  Scheduled to see Dr. Lorelei Pont on 11/23/2021 @ 2:00. [facility] f/u appt confirmed? Yes  Scheduled to see Fabian Sharp on 12/26/2021 @ 10:05. Are transportation arrangements needed? No  If their condition worsens, is the pt aware to call PCP or go to the Emergency Dept.? Yes Was the patient provided with contact information for the PCP's office or ED? Yes Was to pt encouraged to call back with questions or concerns? Yes

## 2021-11-16 NOTE — Telephone Encounter (Signed)
Please see path letter from 2017.  Recall is correct at 2027

## 2021-11-19 DIAGNOSIS — R55 Syncope and collapse: Secondary | ICD-10-CM

## 2021-11-20 DIAGNOSIS — R55 Syncope and collapse: Secondary | ICD-10-CM | POA: Diagnosis not present

## 2021-11-21 NOTE — Progress Notes (Addendum)
[facility] [address], [place], [place] [address] [phone]  Date:  11/23/2021   Name:  Lynn [name]   DOB:  [birth year]   MRN:  [id number]  PCP:  [name], MD    Chief Complaint: Hospitalization Follow-up (11/13/21: syncope/Concerns/ questions: 1.she asks why she keeps having tremors- and it continues to worsen. 2. If she can have her CPE today. /)   History of Present Illness:  Lynn [name] is a 66 y.o. very pleasant female patient who presents with the following:  Patient seen today for hospital follow-up Otherwise I saw her most recently in October-history of paroxysmal atrial tach and fibrillation, on diltiazem and Eliquis. She was seen in the ER on January 30 she recently had a short admission for syncope thought due to orthostatic hypotension  She was accidentally taking too much diuretic- her husband misunderstood the directions and has been doing her pill box incorrectly This has since been rectified and she is back to normal   She has noted a tremor in her hands- this has been gradually worsening for a year or so Present at rest or with intention  No family history of tremor that she is aware of  Her oxygen level has been ok- she does note that when she takes her oxygen off- as she does on occasion- her tremor is worse   Admit date: 11/13/2021 Discharge date: 11/15/2021 Recommendations for Outpatient Follow-up:  Follow up with PCP in 1-2 weeks. Please obtain BMP/CBC in one week. Advised to follow-up with cardiology as scheduled. Patient presented with syncopal episode likely vasovagal given diuretic use. Advised to resume diuretics at the lower dose.   Brief Summary/ Hospital Course: This 66 years old female with PMH significant for CVA, hypertension, CAD, paroxysmal A. fib on eliquis, chronic diastolic heart failure with mitral valve stenosis and calcified mitral valve mass,  insulin-dependent type 2 diabetes, rheumatoid arthritis, COPD, ILD on 3 L of supplemental oxygen at baseline presented in the ED s/p syncope.  Patient reports for last 10 days she has been having episodes where she would break out in  sweat and had a headache and nausea and vomiting.  Yesterday while cooking she became suddenly diaphoretic,  developed pain between shoulder blades and dizzy and passed out.  She also reported worsening shortness of breath requiring 4 L of supplemental oxygen which is higher than her baseline.  Patient was recently seen by cardiology and started on Eliquis for atrial fibrillation. Patient has been advised to take Lasix 20 mg daily,  instead she has been taking 40 mg daily in addition to hydrochlorothiazide. Patient was admitted for syncope possible vasovagal due to higher diuretic use..Cardiology consulted,  recommended to discontinue Lasix and obtain echocardiogram.  Echocardiogram shows LVEF 60 to 65%.  Patient is cleared from cardiology to be discharged on Zio patch to evaluate for arrhythmias. Patient feels better and want to be discharged.   Discharge Diagnoses:  Principal Problem:   Syncope Active Problems:   Essential hypertension   Chronic diastolic CHF (congestive heart failure) (HCC)   Moderate mitral stenosis by prior echocardiography   Type 2 diabetes mellitus with hyperglycemia, with long-term current use of insulin (HCC)   Anemia   PAF (paroxysmal atrial fibrillation) (HCC); CHA2DS2-VASc score 7   Acute on chronic respiratory failure with hypoxia (HCC)   Syncope: Likely vasovagal.  Possible overdiuresis with subsequent dehydration. Patient has been taking 40 mg  of Lasix and hydrochlorothiazide. Patient reported recurrent episodes of diaphoresis with headache, nausea, vomiting associated palpitation. Repeat echocardiogram shows LVEF 60 to 65%.  Severe mitral valve stenosis with calcified mass Cardiology consulted, obtain 2D echocardiogram. Hold on  Lasix.  Continue telemetry. she will need Zio patch at discharge.   Acute on chronic hypoxic respiratory failure: Patient does have a history of COPD and ILD. At baseline 2 to 3 L of supplemental oxygen. Chest x-ray negative.Not in any acute exacerbation. Echocardiogram shows LVEF 60 to 65%  Acute kidney injury: > Resolved. Creatinine 1.8 up from 1.4 from baseline. Avoid nephrotoxic medication. Chronic diastolic heart failure: Appears compensated. Continue to hold Lasix. Paroxysmal atrial fibrillation: Continue Eliquis, continue diltiazem. Essential hypertension: Continue Cardizem, hold Lasix and ACE I until renal function improves. Rheumatoid arthritis: Continue leflunomide and chronic prednisone History of CVA: Continue Eliquis Type 2 diabetes: Continue resistant sliding scale.   Pt needs a letter regarding her oxygen for flight-she plans to travel soon for a girls cruise  She will get me the information about exactly what this letter needs to say   Patient Active Problem List   Diagnosis Date Noted   Acute on chronic respiratory failure with hypoxia ([place]) 11/14/2021   AKI (acute kidney injury) ([place])    Syncope 11/13/2021   Acute gastric ulcer without hemorrhage or perforation    Ulcer of esophagus without bleeding    PAF (paroxysmal atrial fibrillation) ([place]); CHA2DS2-VASc score 7 07/05/2021   Anemia    Acute gastric ulcer with hemorrhage    Occult GI bleeding    Melena    Preop respiratory exam    ILD (interstitial lung disease) ([place])    SOB (shortness of breath) 03/24/2021   FUO (fever of unknown origin) 03/24/2021   Hypokalemia 03/24/2021   Generalized weakness 03/24/2021   Coronary artery spasm ([place]) 08/09/2020   Rheumatoid arthritis ([place]) 11/09/2019   Toe pain, bilateral 05/26/2019   Type 2 diabetes mellitus with hyperglycemia, with long-term current use of insulin ([place]) 05/26/2019   Mobility impaired 04/01/2019   Morbid obesity due to excess calories ([place])  12/03/2017   DOE (dyspnea on exertion) 12/02/2017   Moderate mitral stenosis by prior echocardiography 08/23/2017   Chronic diastolic CHF (congestive heart failure) ([place]) 08/12/2017   Chronic respiratory failure with hypoxia ([place]) 08/12/2017   OSA on CPAP 08/12/2017   COPD with acute bronchitis ([place]) 08/12/2017   Near syncope 07/03/2017   Anxiety 07/03/2017   Coronary artery disease, non-occlusive 12/08/2016   PAT (paroxysmal atrial tachycardia) ([place])    H/O total knee replacement 08/19/2015   Vision, loss, sudden 03/07/2015   S/P TKR (total knee replacement) using cement 02/18/2015   Thalassemia 09/02/2013   Hyperlipidemia associated with type 2 diabetes mellitus ([place]) 08/24/2012   Essential hypertension 07/22/2012    Past Medical History:  Diagnosis Date   Anxiety    Clotting disorder ([place])    blood clot in eye 2016   Coronary artery disease, non-occlusive    a. 11/2016 NSTEMI/Cath: LM nl, LAD 40p, 15m, D1/2 small, LCX 40ost, OM2/3 nl/small, RCA 35 ost/mid, RPDA/RPL small/nl.   Coronary vasospasm (HCC)    Depression    Diastolic dysfunction    a. 11/2016 Echo: EF 55-60%, gr1 DD, sev calcified MV annulus, mildly dil LA.   Eye hemorrhage 01/2015   "right; resolved" (07/03/2017)   Headache    "monthly" (07/03/2017)   History of blood transfusion    "when I had an ectopic pregnancy"   History of  stomach ulcers    Hyperlipidemia    Hypertension    Microcytic anemia    Moderate mitral stenosis by prior echocardiogram 03/2021   TTE: Mod MS (mean gradient 9 mmHg). -- TEE Moderate-Severe MS (mean gradient 14 mmHg) with fixed Post MV Leaflet & Severe MAC w/ large circumscribed calcified mass on the Post MV Leaflet. (Mass previously described in 2018) - CMRI identifies calcified mass & not Tumor.; R&LHC - LVEDP & PCWP both 23 mmHg indicating minimal resting gradient   OSA on CPAP    setting is unknown   PAF (paroxysmal atrial fibrillation) ([place]) 03/2021   Event Monitor Aug-Sept 2022:  Predominantly sinus rhythm.  1% A. fib burden.  30 brief episodes of PAT.  2 short bursts of NSVT.   PAT (paroxysmal atrial tachycardia) ([place])    Event monitor from August 2022 showed 30 brief bursts longest 14 seconds.   Pneumonia    "several times" (07/03/2017)   PVC's (premature ventricular contractions)    Rheumatoid arthritis ([place])    Syncope 07/03/2017 X 2   called seizures but no medications   Thalassemia    "my cells are sickle cell shaped but I don't have sickle cell anemia" (07/03/2017)   Type II diabetes mellitus ([place])     Past Surgical History:  Procedure Laterality Date   48-Hour Monitor  03/18/2017   Sinus rhythm with sinus tachycardia (rate 58-134 BPM)multiple PVCs noted with couplets and bigeminy. One triplet. 7 runs of PAT ranging from 100-130 bpm. Longest was 33 beats.   BIOPSY  04/03/2021   Procedure: BIOPSY;  Surgeon: Yetta Flock, MD;  Location: [facility];  Service: Gastroenterology;;   BREAST BIOPSY Left    BRONCHIAL WASHINGS  03/28/2021   Procedure: BRONCHIAL WASHINGS;  Surgeon: Julian Hy, DO;  Location: [facility];  Service: Endoscopy;;   CARDIAC CATHETERIZATION N/A 11/19/2016   Procedure: Left Heart Cath and Coronary Angiography;  Surgeon: Belva Crome, MD;  Location: [facility];  Service: Cardiovascular.    LM nl, LAD 40p, 13m, D1/2 small, LCX 40ost, OM2/3 nl/small, RCA 35 ost/mid, RPDA/RPL small/nl.   CARDIAC MRI  03/30/2021   Normal LVEF ~53%. 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 No Scar or Late Gadolinium Enhancement on LV Myocardium.   CARPAL TUNNEL RELEASE Right 11/01/2014   COLONOSCOPY     DILATION AND CURETTAGE OF UTERUS     ECTOPIC PREGNANCY SURGERY  X 2   ESOPHAGOGASTRODUODENOSCOPY (EGD) WITH PROPOFOL N/A 04/03/2021   Procedure: ESOPHAGOGASTRODUODENOSCOPY (EGD) WITH PROPOFOL;  Surgeon: AYetta Flock MD;  Location: [facility];  Service: Gastroenterology;  Laterality:  N/A;   ESOPHAGOGASTRODUODENOSCOPY (EGD) WITH PROPOFOL N/A 08/15/2021   Procedure: ESOPHAGOGASTRODUODENOSCOPY (EGD) WITH PROPOFOL;  Surgeon: SLadene Artist MD;  Location: [facility];  Service: Endoscopy;  Laterality: N/A;   JOINT REPLACEMENT     KNEE ARTHROSCOPY Left 11/2014   RIGHT/LEFT HEART CATH AND CORONARY ANGIOGRAPHY N/A 08/08/2021   Procedure: RIGHT/LEFT HEART CATH AND CORONARY ANGIOGRAPHY;  Surgeon: HLeonie Man MD;  [facility];  Service: Cardiovascular;Ost LCx 40%. Ost-Prox RCA 35%. Prox-Mid LAD 25%-<25% D2. Mod-Severely Elevated LVEDP. Mild PA HTN -> PA mean 31 mmHg with a PCWP and LVEDP of 23 mmHg   TEE WITHOUT CARDIOVERSION N/A 03/28/2021   Procedure: TRANSESOPHAGEAL ECHOCARDIOGRAM (TEE);  Surgeon: NThayer Headings MD;  [facility];; LVEF 55-60%. Normal LV Fxn & no RWMA. No LAA thrombus. Gr II Ao Plaque.  Large circumscribed calcific mass (1.8 x 1.4 cm) anterior to posterior annulus.  Fixed posterior leaflet with Mod-Severe MS (Mean MVG ~14 mmHg, Peak 20 mmhg).  Mild MR   TONSILLECTOMY     TOTAL KNEE ARTHROPLASTY Right 02/18/2015   Procedure: RIGHT TOTAL KNEE ARTHROPLASTY;  Surgeon: Netta Cedars, MD;  Location: [facility];  Service: Orthopedics;  Laterality: Right;   TOTAL KNEE ARTHROPLASTY Left 08/19/2015   Procedure: LEFT TOTAL KNEE ARTHROPLASTY;  Surgeon: Netta Cedars, MD;  Location: [facility];  Service: Orthopedics;  Laterality: Left;   TRANSTHORACIC ECHOCARDIOGRAM  11/2016; 07/2017:   a) normal LV size, thickness and function. EF 55-60%. GR 1 DD.No RWMA severe mitral annular calcification, but no mitral stenosis. Mild LA dilation;; b) normal LV size and function.  EF 65-75%.  Unable to assess diastolic function.  Aortic sclerosis with no stenosis.  Moderate mitral stenosis? (Gradient 9 mmHg) -?  Mild-mod left atrial enlargement    TRANSTHORACIC ECHOCARDIOGRAM  01/2019   EF 65 to 70%.  Normal function.  Elevated LVEDP-GR 1 DD.  Normal RV size and  function.  Large calcific mass on posterior mitral leaflet mild to moderate mitral stenosis.   TRANSTHORACIC ECHOCARDIOGRAM  03/24/2021   EF 60 to 65%.  LV function with normal wall motion.  Moderate basal septal LVH.  GRII DD & Mild LA dilation.-> elevated LVEDP.  Normal RV function.  Mildly elevated PAP.  Ill-defined density measuring 3.23 x 2.1 cm region of the posterior MV leaflet along with dense MAC.  Moderate MS (mean MVG of 9 mmHg.) Normal AoV & RAP. --> Recommend TEE 2/2 concern for MV SBE.   VAGINAL HYSTERECTOMY     VIDEO BRONCHOSCOPY N/A 03/28/2021   Procedure: VIDEO BRONCHOSCOPY WITHOUT FLUORO;  Surgeon: Julian Hy, DO;  Location: [facility];  Service: Endoscopy;  Laterality: N/A;    Social History   Tobacco Use   Smoking status: Former    Packs/day: 0.25    Years: 44.00    Pack years: 11.00    Types: Cigarettes    Quit date: 02/17/2015    Years since quitting: 6.7   Smokeless tobacco: Never  Vaping Use   Vaping Use: Never used  Substance Use Topics   Alcohol use: Not Currently    Comment: 07/03/2017 "might have a few drinks/year"   Drug use: No    Family History  Problem Relation Age of Onset   Cancer Mother    Diabetes Mother    Hypertension Mother    Hyperlipidemia Mother    Cancer Father    Hyperlipidemia Father    Mental illness Sister    Diabetes Sister    Diabetes Maternal Grandmother    Diabetes Maternal Grandfather    Colon cancer Neg Hx    Esophageal cancer Neg Hx    Stomach cancer Neg Hx    Rectal cancer Neg Hx     Allergies  Allergen Reactions   Penicillins Hives    Childhood allergy Has patient had a PCN reaction causing immediate rash, facial/tongue/throat swelling, SOB or lightheadedness with hypotension: Yes Has patient had a PCN reaction causing severe rash involving mucus membranes or skin necrosis: No Has patient had a PCN reaction that required hospitalization No Has patient had a PCN reaction occurring within the last 10 years:  No If all of the above answers are "NO", then may proceed with Cephalosporin use.   Metformin And Related Other (See Comments)    Diarrhea, bleeding   [name] [Dapagliflozin] Other (See Comments)  Dizzy and lethargic     Medication list has been reviewed and updated.  Current Outpatient Medications on File Prior to Visit  Medication Sig Dispense Refill   acetaminophen (TYLENOL) 500 MG tablet Take 500 mg by mouth every 6 (six) hours as needed for fever.     acetaminophen-codeine (TYLENOL #3) 300-30 MG tablet Take 1-2 tablets by mouth every 8 (eight) hours as needed for moderate pain. 30 tablet 1   albuterol (VENTOLIN HFA) 108 (90 Base) MCG/ACT inhaler Inhale 2 puffs into the lungs every 6 (six) hours as needed for wheezing or shortness of breath. 8 g 2   apixaban (ELIQUIS) 5 MG TABS tablet Take 1 tablet (5 mg total) by mouth 2 (two) times daily. 160 tablet 3   atorvastatin (LIPITOR) 80 MG tablet TAKE 1 TABLET(80 MG) BY MOUTH DAILY AT 6 PM (Patient taking differently: Take 80 mg by mouth every evening.) 90 tablet 3   cetirizine (ZYRTEC) 10 MG tablet Take 10 mg by mouth daily as needed for allergies.     Continuous Blood Gluc Sensor (DEXCOM G6 SENSOR) MISC 1 Device by Does not apply route as directed. 3 each 6   Continuous Blood Gluc Transmit (DEXCOM G6 TRANSMITTER) MISC 1 Device by Does not apply route as directed. 1 each 3   diltiazem (CARDIZEM CD) 360 MG 24 hr capsule Take 1 capsule (360 mg total) by mouth daily. 90 capsule 3   folic acid (FOLVITE) 1 MG tablet Take 2 tablets (2 mg total) by mouth daily. (Patient taking differently: Take 1 mg by mouth in the morning and at bedtime.) 180 tablet 3   furosemide (LASIX) 20 MG tablet Decrease Lasix ( furosemide)  to 20 mg ( 1/2/tablet)  , with 20 mg  you may take a an additional 20 mg if need for swelling or weight gain more than 3 lbs overnight or 5 lbs in one week -- if you have continue to take an additional 20 mg , go back to 40 mg  Take  additional dose as needed for egt gain> 3 lb in 1 day     glucose blood (CONTOUR NEXT TEST) test strip 3x daily 300 each 11   Insulin Disposable Pump (OMNIPOD DASH PODS, GEN 4,) MISC Inject 1 Dose into the skin continuous. Using pump - depends on carb intake.     insulin lispro (HUMALOG) 100 UNIT/ML injection Max Daily 100 units per pump 90 mL 3   Insulin Pen Needle (B-D UF III MINI PEN NEEDLES) 31G X 5 MM MISC USE DAILY WITH VICTOZA AND LANTUS. 400 each 1   Insulin Pen Needle 31G X 5 MM MISC 1 Device by Does not apply route 3 (three) times daily. 150 each 11   Lancets 30G MISC 1 Device by Does not apply route 3 (three) times daily. 300 each 9   leflunomide (ARAVA) 20 MG tablet Take 20 mg by mouth daily.     Melatonin 5 MG CHEW Chew 5 mg by mouth at bedtime.     Menthol-Methyl Salicylate (SALONPAS PAIN RELIEF PATCH) PTCH Apply 1 patch topically daily as needed (pain).     Multiple Vitamin (MULTIVITAMIN WITH MINERALS) TABS tablet Take 1 tablet by mouth daily.     Oxymetazoline HCl (VICKS SINEX NA) Place 1 spray into the nose daily as needed (congestion).     pantoprazole (PROTONIX) 40 MG tablet Take 1 tablet (40 mg total) by mouth 2 (two) times daily before a meal. 60 tablet 11   predniSONE (  DELTASONE) 5 MG tablet Take 5 mg by mouth daily.     SAFETY-LOK TB SYRINGE 27GX.5" 27G X 1/2" 1 ML MISC      Semaglutide, 2 MG/DOSE, 8 MG/3ML SOPN Inject 2 mg as directed once a week. 9 mL 3   traZODone (DESYREL) 50 MG tablet TAKE 0.5-1 TABLETS BY MOUTH AT BEDTIME AS NEEDED FOR SLEEP. (Patient taking differently: Take 50 mg by mouth at bedtime as needed for sleep.) 90 tablet 2   losartan (COZAAR) 50 MG tablet Take 1 tablet (50 mg total) by mouth daily. 90 tablet 3   No current facility-administered medications on file prior to visit.    Review of Systems:  As per HPI- otherwise negative.   Physical Examination: Vitals:   11/23/21 1358  BP: 120/80  Pulse: 71  Temp: 98.3 F (36.8 C)  SpO2: 97%    Vitals:   11/23/21 1358  Weight: 246 lb 6.4 oz (111.8 kg)  Height: _0  (1.6 m)   Body mass index is 43.65 kg/m. Ideal Body Weight: Weight in (lb) to have BMI = 25: 140.8  GEN: no acute distress.  Obese, looks her normal self.  Using portable oxygen HEENT: Atraumatic, Normocephalic.  Ears and Nose: No external deformity. CV: RRR, No M/G/R. No JVD. No thrill. No extra heart sounds. PULM: CTA B, no wheezes, crackles, rhonchi. No retractions. No resp. distress. No accessory muscle use. ABD: S, NT, ND EXTR: No c/c/e PSYCH: Normally interactive. Conversant.  Patient does have a moderate, rapid tremor in both hands.  Normal strength and movement of both hands.  No other tremor or neurologic abnormality is noted  Assessment and Plan: Vasovagal syncope - Plan: CBC, Basic metabolic panel  Tremor - Plan: Ambulatory referral to Neurology  Nonintractable episodic headache, unspecified headache type - Plan: Ambulatory referral to Neurology  Hospital discharge follow-up  ILD (interstitial lung disease) ([place])  Patient following up today from recent hospital admission for vasovagal can syncope Likely due to confusion about diuretic use.  This problem is resolved, she is feeling much better Labs pending as above She does note tremor in both hands, also periodic headaches.  Will refer to neurology for further evaluation Interstitial lung disease, stable on oxygen.  I am glad to provide a letter regarding oxygen use for air travel We were also somewhat confused about colonoscopy follow-up scheduled.  I touched base with Dr. Fuller Plan, she is on a 10-year follow-up plan  Signed Lamar Blinks, MD  Received labs as above, 2/10.  Message to patient Known history of thalassemia-hemoglobin/hematocrit at baseline Creatinine still a bit higher than her typical baseline Results for orders placed or performed in visit on 11/23/21  CBC  Result Value Ref Range   WBC 10.0 4.0 - 10.5 K/uL   RBC 5.13  (H) 3.87 - 5.11 Mil/uL   Platelets 265.0 150.0 - 400.0 K/uL   Hemoglobin 9.3 (L) 12.0 - 15.0 g/dL   HCT 30.8 (L) 36.0 - 46.0 %   MCV 60.1 (L) 78.0 - 100.0 fl   MCHC 30.3 30.0 - 36.0 g/dL   RDW 16.9 (H) 11.5 - 30.1 %  Basic metabolic panel  Result Value Ref Range   Sodium 138 135 - 145 mEq/L   Potassium 3.7 3.5 - 5.1 mEq/L   Chloride 92 (L) 96 - 112 mEq/L   CO2 37 (H) 19 - 32 mEq/L   Glucose, Bld 171 (H) 70 - 99 mg/dL   BUN 20 6 - 23 mg/dL   Creatinine,  Ser 1.35 (H) 0.40 - 1.20 mg/dL   GFR 40.57 (L) >60.00 mL/min   Calcium 9.5 8.4 - 10.5 mg/dL

## 2021-11-23 ENCOUNTER — Ambulatory Visit (INDEPENDENT_AMBULATORY_CARE_PROVIDER_SITE_OTHER): Payer: Medicare Other | Admitting: Family Medicine

## 2021-11-23 ENCOUNTER — Encounter: Payer: Self-pay | Admitting: Family Medicine

## 2021-11-23 VITALS — BP 120/80 | HR 71 | Temp 98.3°F | Ht 63.0 in | Wt 246.4 lb

## 2021-11-23 DIAGNOSIS — R519 Headache, unspecified: Secondary | ICD-10-CM

## 2021-11-23 DIAGNOSIS — R251 Tremor, unspecified: Secondary | ICD-10-CM

## 2021-11-23 DIAGNOSIS — J849 Interstitial pulmonary disease, unspecified: Secondary | ICD-10-CM | POA: Diagnosis not present

## 2021-11-23 DIAGNOSIS — R55 Syncope and collapse: Secondary | ICD-10-CM | POA: Diagnosis not present

## 2021-11-23 DIAGNOSIS — Z09 Encounter for follow-up examination after completed treatment for conditions other than malignant neoplasm: Secondary | ICD-10-CM

## 2021-11-23 NOTE — Patient Instructions (Addendum)
It was good to see you today!  I will be in touch with your labs I will also touch base with Dr Fuller Plan about your colonoscopy to see when you are due for screening  Please let me know when you had your eye exam, and see me in a few months for your physical

## 2021-11-24 ENCOUNTER — Encounter: Payer: Self-pay | Admitting: Family Medicine

## 2021-11-24 DIAGNOSIS — N189 Chronic kidney disease, unspecified: Secondary | ICD-10-CM

## 2021-11-24 LAB — BASIC METABOLIC PANEL
BUN: 20 mg/dL (ref 6–23)
CO2: 37 mEq/L — ABNORMAL HIGH (ref 19–32)
Calcium: 9.5 mg/dL (ref 8.4–10.5)
Chloride: 92 mEq/L — ABNORMAL LOW (ref 96–112)
Creatinine, Ser: 1.35 mg/dL — ABNORMAL HIGH (ref 0.40–1.20)
GFR: 40.57 mL/min — ABNORMAL LOW (ref >60.00)
Glucose, Bld: 171 mg/dL — ABNORMAL HIGH (ref 70–99)
Potassium: 3.7 mEq/L (ref 3.5–5.1)
Sodium: 138 mEq/L (ref 135–145)

## 2021-11-24 LAB — CBC
HCT: 30.8 % — ABNORMAL LOW (ref 36.0–46.0)
Hemoglobin: 9.3 g/dL — ABNORMAL LOW (ref 12.0–15.0)
MCHC: 30.3 g/dL (ref 30.0–36.0)
MCV: 60.1 fl — ABNORMAL LOW (ref 78.0–100.0)
Platelets: 265 10*3/uL (ref 150.0–400.0)
RBC: 5.13 Mil/uL — ABNORMAL HIGH (ref 3.87–5.11)
RDW: 16.9 % — ABNORMAL HIGH (ref 11.5–15.5)
WBC: 10 10*3/uL (ref 4.0–10.5)

## 2021-11-28 DIAGNOSIS — M5451 Vertebrogenic low back pain: Secondary | ICD-10-CM | POA: Diagnosis not present

## 2021-11-28 DIAGNOSIS — I509 Heart failure, unspecified: Secondary | ICD-10-CM | POA: Diagnosis not present

## 2021-11-28 DIAGNOSIS — I219 Acute myocardial infarction, unspecified: Secondary | ICD-10-CM | POA: Diagnosis not present

## 2021-11-28 DIAGNOSIS — M5136 Other intervertebral disc degeneration, lumbar region: Secondary | ICD-10-CM | POA: Diagnosis not present

## 2021-12-04 ENCOUNTER — Other Ambulatory Visit: Payer: Self-pay | Admitting: Family Medicine

## 2021-12-04 ENCOUNTER — Other Ambulatory Visit: Payer: Self-pay | Admitting: Primary Care

## 2021-12-04 DIAGNOSIS — I10 Essential (primary) hypertension: Secondary | ICD-10-CM

## 2021-12-04 DIAGNOSIS — E1165 Type 2 diabetes mellitus with hyperglycemia: Secondary | ICD-10-CM | POA: Diagnosis not present

## 2021-12-04 DIAGNOSIS — Z794 Long term (current) use of insulin: Secondary | ICD-10-CM | POA: Diagnosis not present

## 2021-12-05 NOTE — Telephone Encounter (Signed)
Beth, please advise on refill request. ?

## 2021-12-05 NOTE — Telephone Encounter (Signed)
No prednisone was stopped in september by MR

## 2021-12-11 ENCOUNTER — Other Ambulatory Visit: Payer: Self-pay | Admitting: Internal Medicine

## 2021-12-17 ENCOUNTER — Encounter: Payer: Self-pay | Admitting: Cardiology

## 2021-12-17 NOTE — Assessment & Plan Note (Signed)
Also short bursts of PAT for atrial runs.  She remains on diltiazem and is less symptomatic. ?

## 2021-12-17 NOTE — Assessment & Plan Note (Signed)
Relatively stable cardiac findings and Did not have significant elevated pressures. ? ?Intermittent follow-up with echocardiogram. ?

## 2021-12-17 NOTE — Assessment & Plan Note (Signed)
No significant found on recent cardiac cath. ? ?Plan: ?Continue diltiazem for microvascular disease/spasm.  Also on high-dose atorvastatin. ?ARB for afterload reduction. ?

## 2021-12-17 NOTE — Assessment & Plan Note (Signed)
I do not see labs since 2021.  PCP is not following. ?She is on 80 mg atorvastatin for her lipids.  She is on Humalog insulin to stabilize and treat.  Monitored by endocrinology. ?

## 2021-12-17 NOTE — Progress Notes (Unsigned)
Cardiology Office Note:    Date:  12/17/2021   ID:  Lynn [name], DOB 1956/01/21, MRN [id number]  PCP:  [name], MD   [place] Prov[facility] Cardiologist:  Lynn Hew, MD { Click to update primary MD,subspecialty MD or APP then REFRESH:1}    Referring MD: Copland, Gay Filler, MD   No chief complaint on file. ***  History of Present Illness:    Lynn [name] is a 66 y.o. female with a hx of RA, nonobstructive CAD, presumed coronary vasospasm, mitral stenosis, anxiety, stomach ulcers, HTN, HLD, former tobacco smoker, microcytic anemia, OSA, CKD stage IIIa, seizures, thalassemia, DM2, and PAT.   Hx of CP with heart cath 11/2016 showing moderate-heavy coronary calcification, 40-50% LAD, 30-50% proximal LAD, and inferolateral calcified mass felt to reflect MAC. She had runs of SVT. Follow up event monitor showed mostly NSR, multifocal PVCs with couplets and bigeminy, and 7 PAT runs. She has been treated for coronary spasm with diltiazem and imdur. Echo 07/2017 confirmed heavy posterior MAC with moderate stenosis.   She was hospitalized at [facility] 03/27/21 with daily fevers x 3 + weeks with associated symptoms and 10 lb weight loss, along with atypical chest pain. She was trying to manage OP, but presented due to worsening symptoms, found to be in respiratory distress requiring supplemental O2. CTA negative for PE. COVID and RVP negative. Echo with LVEF 60-65%, moderate LVH, grade 2 DD, MAC with moderate MS, and mass on posterior annulus.  EKG also with newly diagnosed Afib x 3 hrs then converted to NSR. Brain MRI questioned dural AV fistula. TEE showed large mass adherent to posterior annulus, fixed posterior leaflet and moderate to severe MR. Posterolateral mitral annulus mass consistent with degenerative MAC.   Pulmonary was consulted for CT chest findings, who noted: "Patient underwent bronchoscopy on 6/14 showed predominant lymphocytosis greater than 60% on BAL,  felt to be consistent with methotrexate hypersensitivity pneumonitis or feather pillow hypersensitivity pneumonitis or combination; rarely it can be ill IP from rheumatoid arthritis. CT chest on 03/30/2021 showed marked increase peribronchovascular groundglass and upper/mid lung zone with interstitial thickening and scattered collapse/consolidation. Suggestive of alternative diagnosis (not UIP).  Infectious disease was consulted on 03/31/2021 recommending Bactrim prophylactically if on prednisone greater than 20 mg/day for 2 months."  She was seen by pulmonary who has suggested speaking with rheum about changing methotrexate. She was treated with bactrim while completing a prolonged steroid taper (2-3 months). Work up for fever of unknown origin included full RVP, BAL, and blood cultures negative. She had a hemoccult positive but H&H remained stable. GI consulted and performed EGD that showed nonbleeding esophageal erosion/ulcer and 2 nonbleeding small gastric ulcers. Discharged on PO protonix. I saw her in follow up Aug 2022 and she described palpitations and dizziness. I placed a zio patch which showed Afib with burden less than 1%, NSVT, and 30 episodes of SVT.   She was seen in clinic by Dr. Ellyn [name] 10/27/21 and lasix was reduced to 20 mg. HCTZ was stopped (shouldn't have been taking) due to recent dizziness. She presented to [facility] for syncopal event felt to be vasovagal with prodromal symptoms in the settin gof dehydration from diuretic use. Was supposed to be taking 20 mg lasix but has been taking 40 mg lasix. Seh was discharged on 20 mg lasix PRN. Cardiac monitor was placed and showed no true SVT, no Afib/flutter and 43 atrial runs. No significant arrhythmia to explain syncope.   She presents for follow up.  Moderate-severe MR, MAC - echo and MRI with posterior leaflet mass that is heavy calcification - not felt to be a mass - followed with serial echos   CAD Coronary vasospasm /  microvascular disease Hypertension - continue statin, imdur, losartan, cardizem - heart cath 2022 with no obstructive disease, suspect microvascular disease   Paroxysmal atrial fibrillation - diagnosed 03/26/21 - continue home cardizem - preferred over BB - EKG today with ****   Need for oral anticoagulation This patients CHA2DS2-VASc Score and unadjusted Ischemic Stroke Rate (% per year) is equal to 7.2 % stroke rate/year from a score of 5 (HTN, DM, female, age, CAD, HFpEF) - she was not placed on on oral anticoagulation prior to discharge due to a positive hemoccult/GI bleed prior to discharge in the setting of anemia and thalassemia    Chronic diastolic heart failure Chronic respiratory failure on home O2 - 20 mg lasix   CKD stage III - sCr at discharge 1.35    Past Medical History:  Diagnosis Date   Anxiety    Clotting disorder ([place])    blood clot in eye 2016   Coronary artery disease, non-occlusive    a. 11/2016 NSTEMI/Cath: LM nl, LAD 40p, 51m, D1/2 small, LCX 40ost, OM2/3 nl/small, RCA 35 ost/mid, RPDA/RPL small/nl.   Coronary vasospasm ([facility])    Depression    Diastolic dysfunction    a. 11/2016 Echo: EF 55-60%, gr1 DD, sev calcified MV annulus, mildly dil LA.   Eye hemorrhage 01/2015   "right; resolved" (07/03/2017)   Headache    "monthly" (07/03/2017)   History of blood transfusion    "when I had an ectopic pregnancy"   History of stomach ulcers    Hyperlipidemia    Hypertension    Microcytic anemia    Moderate mitral stenosis by prior echocardiogram 03/2021   TTE: Mod MS (mean gradient 9 mmHg). -- TEE Moderate-Severe MS (mean gradient 14 mmHg) with fixed Post MV Leaflet & Severe MAC w/ large circumscribed calcified mass on the Post MV Leaflet. (Mass previously described in 2018) - CMRI identifies calcified mass & not Tumor.; R&LHC - LVEDP & PCWP both 23 mmHg indicating minimal resting gradient   OSA on CPAP    setting is unknown   PAF (paroxysmal atrial  fibrillation) ([place] 03/2021   Event Monitor Aug-Sept 2022: Predominantly sinus rhythm.  1% A. fib burden.  30 brief episodes of PAT.  2 short bursts of NSVT.   PAT (paroxysmal atrial tachycardia) ([place]    Event monitor from August 2022 showed 30 brief bursts longest 14 seconds.   Pneumonia    "several times" (07/03/2017)   PVC's (premature ventricular contractions)    Rheumatoid arthritis ([place] [employer]    Syncope 07/03/2017 X 2   called seizures but no medications   Thalassemia    "my cells are sickle cell shaped but I don't have sickle cell anemia" (07/03/2017)   Type II diabetes mellitus ([place]     Past Surgical History:  Procedure Laterality Date   48-Hour Monitor  03/18/2017   Sinus rhythm with sinus tachycardia (rate 58-134 BPM)multiple PVCs noted with couplets and bigeminy. One triplet. 7 runs of PAT ranging from 100-130 bpm. Longest was 33 beats.   BIOPSY  04/03/2021   Procedure: BIOPSY;  Surgeon: AYetta Flock MD;  [facility];  Service: Gastroenterology;;   BREAST BIOPSY Left    BRONCHIAL WASHINGS  03/28/2021   Procedure: BRONCHIAL WASHINGS;  Surgeon: CJulian Hy DO;  [place] [facility]  Service: Endoscopy;;   CARDIAC CATHETERIZATION N/A 11/19/2016   Procedure: Left Heart Cath and Coronary Angiography;  Surgeon: Belva Crome, MD;  Location: [place] [facility];  Service: Cardiovascular.    LM nl, LAD 40p, 1m, D1/2 small, LCX 40ost, OM2/3 nl/small, RCA 35 ost/mid, RPDA/RPL small/nl.   CARDIAC MRI  03/30/2021   Normal LVEF ~53%. 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 No Scar or Late Gadolinium Enhancement on LV Myocardium.   CARPAL TUNNEL RELEASE Right 11/01/2014   COLONOSCOPY     DILATION AND CURETTAGE OF UTERUS     ECTOPIC PREGNANCY SURGERY  X 2   ESOPHAGOGASTRODUODENOSCOPY (EGD) WITH PROPOFOL N/A 04/03/2021   Procedure: ESOPHAGOGASTRODUODENOSCOPY (EGD) WITH PROPOFOL;  Surgeon: AYetta Flock MD;   [facility];  Service: Gastroenterology;  Laterality: N/A;   ESOPHAGOGASTRODUODENOSCOPY (EGD) WITH PROPOFOL N/A 08/15/2021   Procedure: ESOPHAGOGASTRODUODENOSCOPY (EGD) WITH PROPOFOL;  Surgeon: SLadene Artist MD;  Location: [facility];  Service: Endoscopy;  Laterality: N/A;   JOINT REPLACEMENT     KNEE ARTHROSCOPY Left 11/2014   RIGHT/LEFT HEART CATH AND CORONARY ANGIOGRAPHY N/A 08/08/2021   Procedure: RIGHT/LEFT HEART CATH AND CORONARY ANGIOGRAPHY;  Surgeon: HLeonie Man MD;  [facility];  Service: Cardiovascular;Ost LCx 40%. Ost-Prox RCA 35%. Prox-Mid LAD 25%-<25% D2. Mod-Severely Elevated LVEDP. Mild PA HTN -> PA mean 31 mmHg with a PCWP and LVEDP of 23 mmHg   TEE WITHOUT CARDIOVERSION N/A 03/28/2021   Procedure: TRANSESOPHAGEAL ECHOCARDIOGRAM (TEE);  Surgeon: NThayer Headings MD;  [facility];; LVEF 55-60%. Normal LV Fxn & no RWMA. No LAA thrombus. Gr II Ao Plaque. Large circumscribed calcific mass (1.8 x 1.4 cm) anterior to posterior annulus.  Fixed posterior leaflet with Mod-Severe MS (Mean MVG ~14 mmHg, Peak 20 mmhg).  Mild MR   TONSILLECTOMY     TOTAL KNEE ARTHROPLASTY Right 02/18/2015   Procedure: RIGHT TOTAL KNEE ARTHROPLASTY;  Surgeon: SNetta Cedars MD;  [place] [facility]  Service: Orthopedics;  Laterality: Right;   TOTAL KNEE ARTHROPLASTY Left 08/19/2015   Procedure: LEFT TOTAL KNEE ARTHROPLASTY;  Surgeon: SNetta Cedars MD;  [place] [facility]  Service: Orthopedics;  Laterality: Left;   TRANSTHORACIC ECHOCARDIOGRAM  11/2016; 07/2017:   a) normal LV size, thickness and function. EF 55-60%. GR 1 DD.No RWMA severe mitral annular calcification, but no mitral stenosis. Mild LA dilation;; b) normal LV size and function.  EF 65-75%.  Unable to assess diastolic function.  Aortic sclerosis with no stenosis.  Moderate mitral stenosis? (Gradient 9 mmHg) -?  Mild-mod left atrial enlargement    TRANSTHORACIC ECHOCARDIOGRAM  01/2019   EF 65 to 70%.   Normal function.  Elevated LVEDP-GR 1 DD.  Normal RV size and function.  Large calcific mass on posterior mitral leaflet mild to moderate mitral stenosis.   TRANSTHORACIC ECHOCARDIOGRAM  03/24/2021   EF 60 to 65%.  LV function with normal wall motion.  Moderate basal septal LVH.  GRII DD & Mild LA dilation.-> elevated LVEDP.  Normal RV function.  Mildly elevated PAP.  Ill-defined density measuring 3.23 x 2.1 cm region of the posterior MV leaflet along with dense MAC.  Moderate MS (mean MVG of 9 mmHg.) Normal AoV & RAP. --> Recommend TEE 2/2 concern for MV SBE.   VAGINAL HYSTERECTOMY     VIDEO BRONCHOSCOPY N/A 03/28/2021   Procedure: VIDEO BRONCHOSCOPY WITHOUT FLUORO;  Surgeon: CJulian Hy DO;  [facility];  Service: Endoscopy;  Laterality: N/A;    Current Medications: No outpatient medications have been marked as taking for the 12/26/21 encounter (Appointment) with [name], [place].     Allergies:   Penicillins, Metformin and related, and [place] [dapagliflozin]   Social History   Socioeconomic History   Marital status: Married    Spouse name: Not on file   Number of children: 1   Years of education: Not on file   Highest education level: Not on file  Occupational History   Occupation: retired  Tobacco Use   Smoking status: Former    Packs/day: 0.25    Years: 44.00    Pack years: 11.00    Types: Cigarettes    Quit date: 02/17/2015    Years since quitting: 6.8   Smokeless tobacco: Never  Vaping Use   Vaping Use: Never used  Substance and Sexual Activity   Alcohol use: Not Currently    Comment: 07/03/2017 "might have a few drinks/year"   Drug use: No   Sexual activity: Never  Other Topics Concern   Not on file  Social History Narrative   Not on file   Social Determinants of Health   Financial Resource Strain: Low Risk    Difficulty of Paying Living Expenses: Not hard at all  Food Insecurity: No Food Insecurity   Worried About Charity fundraiser in  the Last Year: Never true   [place] in the Last Year: Never true  Transportation Needs: No Transportation Needs   Lack of Transportation (Medical): No   Lack of Transportation (Non-Medical): No  Physical Activity: Inactive   Days of Exercise per Week: 0 days   Minutes of Exercise per Session: 0 min  Stress: Stress Concern Present   Feeling of Stress : To some extent  Social Connections: Moderately Isolated   Frequency of Communication with Friends and Family: More than three times a week   Frequency of Social Gatherings with Friends and Family: More than three times a week   Attends Religious Services: Never   Marine scientist or Organizations: No   Attends Music therapist: Never   Marital Status: Married     Family History: The patient's ***family history includes Cancer in her father and mother; Diabetes in her maternal grandfather, maternal grandmother, mother, and sister; Hyperlipidemia in her father and mother; Hypertension in her mother; Mental illness in her sister. There is no history of Colon cancer, Esophageal cancer, Stomach cancer, or Rectal cancer.  ROS:   Please see the history of present illness.    *** All other systems reviewed and are negative.  EKGs/Labs/Other Studies Reviewed:    The following studies were reviewed today:  Heart monitor 06/2021: Patch Wear Time:  12 days and 17 hours (2023-02-05T10:04:51-0500 to 2023-02-18T03:22:11-498)   Patient had a min HR of 51 bpm, max HR of 176 bpm, and avg HR of 77 bpm. Predominant underlying rhythm was Sinus Rhythm. 2 Ventricular Tachycardia runs occurred, the run with the fastest interval lasting 5 beats with a max rate of 169 bpm, the longest  lasting 12 beats with an avg rate of 132 bpm. 43 Supraventricular Tachycardia runs occurred, the run with the fastest interval lasting 5 beats with a max rate of 176 bpm, the longest lasting 13 beats with an avg rate of 93 bpm. Some episodes of   Supraventricular Tachycardia may be possible Atrial Tachycardia with variable block. Isolated SVEs were frequent (8.4%, 94029), SVE Couplets were rare (<1.0%, 2310),  and SVE Triplets were rare (<1.0%, 360). Isolated VEs were rare (<1.0%), VE Couplets  were rare (<1.0%), and no VE Triplets were present. Ventricular Bigeminy was present.   Right and left heart cath 08/08/21:   Colon Flattery Cx lesion is 40% stenosed.   Ost RCA to Prox RCA lesion is 35% stenosed.   Prox LAD to Mid LAD lesion is 25% stenosed with 25% stenosed side branch in 2nd Diag.   Mid LAD to Dist LAD lesion is 30% stenosed.   LV end diastolic pressure is severely elevated.   Hemodynamic findings consistent with mild pulmonary hypertension.   There is no aortic valve stenosis.   SUMMARY Minimal Coronary Artery Disease Mild Secondary Pulmonry Hypertension (Pulmonary Venous Hypertension) -> PA mean 31 mmHg with a PCWP and LVEDP of 23 mmHg. Known moderate to severe mitral stenosis without significant PCWP elevation. Cardiac output-index 6.43-3.0.  Borderline.    Echo 11/14/21:  1. Left ventricular ejection fraction, by estimation, is 60 to 65%. The  left ventricle has normal function. The left ventricle has no regional  wall motion abnormalities. There is severe left ventricular hypertrophy.  Left ventricular diastolic parameters   are consistent with Grade I diastolic dysfunction (impaired relaxation).   2. Right ventricular systolic function is normal. The right ventricular  size is normal.   3. The pericardial effusion is circumferential.   4. Severe MAC has not changed since TEE. The mitral valve is  degenerative. No evidence of mitral valve regurgitation. Moderate to  severe mitral stenosis. The mean mitral valve gradient is 7.5 mmHg. Severe  mitral annular calcification.   5. The aortic valve is normal in structure. Aortic valve regurgitation is  not visualized. No aortic stenosis is present.   6. The inferior vena cava  is normal in size with greater than 50%  respiratory variability, suggesting right atrial pressure of 3 mmHg.     EKG:  EKG is *** ordered today.  The ekg ordered today demonstrates ***  Recent Labs: 03/20/2021: Pro B Natriuretic peptide (BNP) 57.0 11/13/2021: ALT 17; B Natriuretic Peptide 90.8 11/14/2021: TSH 2.280 11/15/2021: Magnesium 2.2 11/23/2021: BUN 20; Creatinine, Ser 1.35; Hemoglobin 9.3; Platelets 265.0; Potassium 3.7; Sodium 138  Recent Lipid Panel    Component Value Date/Time   CHOL 97 08/22/2020 1414   TRIG 74.0 08/22/2020 1414   HDL 38.80 (L) 08/22/2020 1414   CHOLHDL 3 08/22/2020 1414   VLDL 14.8 08/22/2020 1414   LDLCALC 44 08/22/2020 1414   LDLDIRECT 131 (H) 09/02/2013 1004     Risk Assessment/Calculations:   {Does this patient have ATRIAL FIBRILLATION?[phone]}       Physical Exam:    VS:  LMP  (LMP Unknown)     Wt Readings from Last 3 Encounters:  11/23/21 246 lb 6.4 oz (111.8 kg)  11/15/21 244 lb 1.6 oz (110.7 kg)  10/27/21 248 lb (112.5 kg)     GEN: *** Well nourished, well developed in no acute distress HEENT: Normal NECK: No JVD; No carotid bruits LYMPHATICS: No lymphadenopathy CARDIAC: ***RRR, no murmurs, rubs, gallops RESPIRATORY:  Clear to auscultation without rales, wheezing or rhonchi  ABDOMEN: Soft, non-tender, non-distended MUSCULOSKELETAL:  No edema; No deformity  SKIN: Warm and dry NEUROLOGIC:  Alert and oriented x 3 PSYCHIATRIC:  Normal affect   ASSESSMENT:    No diagnosis found. PLAN:    In order of problems listed above:  ***      {Are you ordering a CV Procedure (e.g. stress test, cath, DCCV, TEE,  etc)?   Press F2        :[id number]    Medication Adjustments/Labs and Tests Ordered: Current medicines are reviewed at length with the patient today.  Concerns regarding medicines are outlined above.  No orders of the defined types were placed in this encounter.  No orders of the defined types were placed in this  encounter.   There are no Patient Instructions on file for this visit.   Signed, [name], PA  12/17/2021 12:14 PM    [facility]

## 2021-12-17 NOTE — Assessment & Plan Note (Signed)
Relatively low burden.  At reassuring because she was symptomatic when she had it. ? ?On stable dose of diltiazem 360 mg for rate control.  Holding off on rhythm control. ?Tolerating Eliquis.  No bleeding issues. ?

## 2021-12-17 NOTE — Assessment & Plan Note (Addendum)
Stable BP no change in meds.  Would not want to let her get to hypotensive because of concerns for dizziness. ? ?I chest back down losartan to 50 mg I think were stable at that dose. ?

## 2021-12-17 NOTE — Assessment & Plan Note (Signed)
With the need for rate control, using diltiazem for rate control and antispasm medication.  I still think some of her chest pain is musculoskeletal and not cardiac. ?

## 2021-12-17 NOTE — Assessment & Plan Note (Signed)
Thankfully, blood counts been stable since starting Eliquis. ?

## 2021-12-17 NOTE — Assessment & Plan Note (Signed)
The patient understands the need to lose weight with diet and exercise. We have discussed specific strategies for this. ?Unfortunate, she is limited by lack of mobility from her arthritis pains. ?

## 2021-12-17 NOTE — Assessment & Plan Note (Signed)
Euvolemic on exam.  Not necessarily noting PND orthopnea. ?On losartan for afterload reduction, and preferentially on diltiazem over beta-blocker for rate control and needs ablation. ? ?With Recent dizzy spells the plan will be for her to reduce her Lasix dose to 20 mg daily with additional 20 mg for weight gain more than 3 pounds. ?

## 2021-12-18 DIAGNOSIS — M5451 Vertebrogenic low back pain: Secondary | ICD-10-CM | POA: Diagnosis not present

## 2021-12-20 DIAGNOSIS — M5451 Vertebrogenic low back pain: Secondary | ICD-10-CM | POA: Diagnosis not present

## 2021-12-25 DIAGNOSIS — M5451 Vertebrogenic low back pain: Secondary | ICD-10-CM | POA: Diagnosis not present

## 2021-12-26 ENCOUNTER — Ambulatory Visit: Payer: Medicare Other | Admitting: Physician Assistant

## 2021-12-26 ENCOUNTER — Other Ambulatory Visit: Payer: Self-pay

## 2021-12-26 ENCOUNTER — Encounter: Payer: Self-pay | Admitting: Physician Assistant

## 2021-12-26 VITALS — BP 134/66 | HR 80 | Ht 63.0 in | Wt 248.0 lb

## 2021-12-26 DIAGNOSIS — I2510 Atherosclerotic heart disease of native coronary artery without angina pectoris: Secondary | ICD-10-CM

## 2021-12-26 DIAGNOSIS — I050 Rheumatic mitral stenosis: Secondary | ICD-10-CM | POA: Diagnosis not present

## 2021-12-26 DIAGNOSIS — E1169 Type 2 diabetes mellitus with other specified complication: Secondary | ICD-10-CM | POA: Diagnosis not present

## 2021-12-26 DIAGNOSIS — M545 Low back pain, unspecified: Secondary | ICD-10-CM | POA: Diagnosis not present

## 2021-12-26 DIAGNOSIS — E785 Hyperlipidemia, unspecified: Secondary | ICD-10-CM

## 2021-12-26 DIAGNOSIS — N183 Chronic kidney disease, stage 3 unspecified: Secondary | ICD-10-CM

## 2021-12-26 DIAGNOSIS — R079 Chest pain, unspecified: Secondary | ICD-10-CM

## 2021-12-26 DIAGNOSIS — J9610 Chronic respiratory failure, unspecified whether with hypoxia or hypercapnia: Secondary | ICD-10-CM | POA: Diagnosis not present

## 2021-12-26 DIAGNOSIS — I10 Essential (primary) hypertension: Secondary | ICD-10-CM | POA: Diagnosis not present

## 2021-12-26 DIAGNOSIS — Z7901 Long term (current) use of anticoagulants: Secondary | ICD-10-CM

## 2021-12-26 DIAGNOSIS — I480 Paroxysmal atrial fibrillation: Secondary | ICD-10-CM

## 2021-12-26 DIAGNOSIS — I201 Angina pectoris with documented spasm: Secondary | ICD-10-CM

## 2021-12-26 DIAGNOSIS — I5032 Chronic diastolic (congestive) heart failure: Secondary | ICD-10-CM

## 2021-12-26 DIAGNOSIS — G8929 Other chronic pain: Secondary | ICD-10-CM

## 2021-12-26 NOTE — Patient Instructions (Signed)
Medication Instructions:  ?No Changes ?*If you need a refill on your cardiac medications before your next appointment, please call your pharmacy* ? ? ?Lab Work: ?No labs ?If you have labs (blood work) drawn today and your tests are completely normal, you will receive your results only by: ?MyChart Message (if you have MyChart) OR ?A paper copy in the mail ?If you have any lab test that is abnormal or we need to change your treatment, we will call you to review the results. ? ? ?Testing/Procedures: ?No Testing ? ? ?Follow-Up: ?At [facility], you and your health needs are our priority.  As part of our continuing mission to provide you with exceptional heart care, we have created designated Provider Care Teams.  These Care Teams include your primary Cardiologist (physician) and Advanced Practice Providers (APPs -  Physician Assistants and Nurse Practitioners) who all work together to provide you with the care you need, when you need it. ? ?We recommend signing up for the patient portal called "MyChart".  Sign up information is provided on this After Visit Summary.  MyChart is used to connect with patients for Virtual Visits (Telemedicine).  Patients are able to view lab/test results, encounter notes, upcoming appointments, etc.  Non-urgent messages can be sent to your provider as well.   ?To learn more about what you can do with MyChart, go to [email or web address].   ? ?Your next appointment:   ?5-6 month(s) ? ?The format for your next appointment:   ?In Person ? ?Provider:   ?Glenetta Hew, MD   ? ? ?Other Instructions ?Take weight daily in morning. Take 1/2 Tablet ('10mg'$ ) Lasix. If weight gain 3lbs. Overnight then take 20 mg lasix.  ?

## 2021-12-27 DIAGNOSIS — M5451 Vertebrogenic low back pain: Secondary | ICD-10-CM | POA: Diagnosis not present

## 2022-01-01 DIAGNOSIS — M5451 Vertebrogenic low back pain: Secondary | ICD-10-CM | POA: Diagnosis not present

## 2022-01-03 DIAGNOSIS — M5451 Vertebrogenic low back pain: Secondary | ICD-10-CM | POA: Diagnosis not present

## 2022-01-04 DIAGNOSIS — Z794 Long term (current) use of insulin: Secondary | ICD-10-CM | POA: Diagnosis not present

## 2022-01-04 DIAGNOSIS — E1165 Type 2 diabetes mellitus with hyperglycemia: Secondary | ICD-10-CM | POA: Diagnosis not present

## 2022-01-10 DIAGNOSIS — M5451 Vertebrogenic low back pain: Secondary | ICD-10-CM | POA: Diagnosis not present

## 2022-01-18 ENCOUNTER — Ambulatory Visit: Payer: Medicare Other | Admitting: Neurology

## 2022-01-18 ENCOUNTER — Encounter: Payer: Self-pay | Admitting: Neurology

## 2022-01-18 VITALS — BP 98/65 | HR 81 | Ht 63.0 in | Wt 244.0 lb

## 2022-01-18 DIAGNOSIS — R251 Tremor, unspecified: Secondary | ICD-10-CM

## 2022-01-18 DIAGNOSIS — Z8669 Personal history of other diseases of the nervous system and sense organs: Secondary | ICD-10-CM | POA: Diagnosis not present

## 2022-01-18 DIAGNOSIS — Z9289 Personal history of other medical treatment: Secondary | ICD-10-CM | POA: Diagnosis not present

## 2022-01-18 NOTE — Progress Notes (Signed)
Subjective:  ?  ?Patient ID: Lynn [name] is a 66 y.o. female. ? ?HPI ? ? ? ?Star Age, MD, PhD ?[facility] ?[address] ?[phone] ?[place], [place] [address] ? ?Dear Dr. Lorelei Pont,  ? ?I saw your patient, Lynn [name], upon your kind request in my neurologic clinic today for initial consultation of her tremors.  The patient is unaccompanied by her husband today.  As you know, Lynn [name] is a 66 year old right-handed woman with an underlying complex medical history of coronary artery disease, congestive heart failure, mitral stenosis, paroxysmal A-fib (on Eliquis), paroxysmal atrial tachycardia, thalassemia, history of syncope with recent hospitalization secondary to dehydration with overdiuresis, diabetes, anxiety, COPD, oxygen dependence, rheumatoid arthritis, sleep apnea, prior stroke, low back pain and severe obesity with a BMI of over 40, who reports new onset hand tremors since summer of last year.  She reports that she was hospitalized in June 2022 and started having a tremor during the hospitalization.  She was taken off of methotrexate at the time.  She does report having had quite a bit of stress.  She reports that she also had significant low back pain and recently finished physical therapy for her low back pain.  She feels that her tremor has improved in the recent past especially after her physical therapy.  She has not fallen, she has no family history of tremors as far as she knows, she is 1 of 4 sisters altogether, none of her sisters have or had a tremor.  She has 1 son who does not have a tremor, no family history of Parkinson's disease.  She does use a walker to mobilize typically but did not bring a walking aid today.  She uses oxygen 3 L 24-7.  She is supposed to be using her CPAP machine but has not used it recently.  She reports that her pulmonologist and she were talking about it and that she was supposed to have a sleep study done. ?I  reviewed your office note from 2023.  She is followed by cardiology.  She is also followed by pulmonology.  She has also seen neurosurgery.  She had a CT angiogram of the head with and without contrast on 07/06/2021 and I reviewed the results: IMPRESSION: ?1. No evidence of vascular malformation/fistula at the patient's ?left cerebral infarct. ?2. Mild atherosclerosis without stenosis of major vessels. ? ?She had a brain MRI with and without contrast on 03/26/2021 and I reviewed the results:   ?IMPRESSION: ?No evidence of septic emboli are other acute abnormality. ?  ?Chronic left parietal volume loss with gyral calcification. ?Increased cortical veins in this region and relative prominence of ?left deep cerebral veins raise possibility of a dural arteriovenous ?fistula. ? ?She tries to hydrate well/better now.  She is no longer on hydrochlorothiazide but does take Lasix as needed.  She does not drink any alcohol and limits her caffeine to 1 cup of coffee per day. ?  ?Her Past Medical History Is Significant For: ?Past Medical History:  ?Diagnosis Date  ? Anxiety   ? Clotting disorder ([place])   ? blood clot in eye 2016  ? Coronary artery disease, non-occlusive   ? a. 11/2016 NSTEMI/Cath: LM nl, LAD 40p, 14m, D1/2 small, LCX 40ost, OM2/3 nl/small, RCA 35 ost/mid, RPDA/RPL small/nl.  ? Coronary vasospasm ([facility])   ? Depression   ? Diastolic dysfunction   ? a. 11/2016 Echo: EF 55-60%, gr1 DD, sev calcified MV annulus, mildly dil LA.  ?  Eye hemorrhage 01/2015  ? "right; resolved" (07/03/2017)  ? Headache   ? "monthly" (07/03/2017)  ? History of blood transfusion   ? "when I had an ectopic pregnancy"  ? History of stomach ulcers   ? Hyperlipidemia   ? Hypertension   ? Microcytic anemia   ? Moderate mitral stenosis by prior echocardiogram 03/2021  ? TTE: Mod MS (mean gradient 9 mmHg). -- TEE Moderate-Severe MS (mean gradient 14 mmHg) with fixed Post MV Leaflet & Severe MAC w/ large circumscribed calcified mass on the Post MV  Leaflet. (Mass previously described in 2018) - CMRI identifies calcified mass & not Tumor.; R&LHC - LVEDP & PCWP both 23 mmHg indicating minimal resting gradient  ? OSA on CPAP   ? setting is unknown  ? PAF (paroxysmal atrial fibrillation) ([place]) 03/2021  ? Event Monitor Aug-Sept 2022: Predominantly sinus rhythm.  1% A. fib burden.  30 brief episodes of PAT.  2 short bursts of NSVT.  ? PAT (paroxysmal atrial tachycardia) ([place])   ? Event monitor from August 2022 showed 30 brief bursts longest 14 seconds.  ? Pneumonia   ? "several times" (07/03/2017)  ? PVC's (premature ventricular contractions)   ? Rheumatoid arthritis ([place])   ? Syncope 07/03/2017 X 2  ? called seizures but no medications  ? Thalassemia   ? "my cells are sickle cell shaped but I don't have sickle cell anemia" (07/03/2017)  ? Type II diabetes mellitus ([place])   ? ? ?Her Past Surgical History Is Significant For: ?Past Surgical History:  ?Procedure Laterality Date  ? 48-Hour Monitor  03/18/2017  ? Sinus rhythm with sinus tachycardia (rate 58-134 BPM)multiple PVCs noted with couplets and bigeminy. One triplet. 7 runs of PAT ranging from 100-130 bpm. Longest was 33 beats.  ? BIOPSY  04/03/2021  ? Procedure: BIOPSY;  Surgeon: [place]ta Flock, MD;  Location: [facility];  Service: Gastroenterology;;  ? BREAST BIOPSY Left   ? BRONCHIAL WASHINGS  03/28/2021  ? Procedure: BRONCHIAL WASHINGS;  Surgeon: Julian Hy, DO;  Location: [facility];  Service: Endoscopy;;  ? CARDIAC CATHETERIZATION N/A 11/19/2016  ? Procedure: Left Heart Cath and Coronary Angiography;  Surgeon: Belva Crome, MD;  Location: [facility];  Service: Cardiovascular.    LM nl, LAD 40p, 54m, D1/2 small, LCX 40ost, OM2/3 nl/small, RCA 35 ost/mid, RPDA/RPL small/nl.  ? CARDIAC MRI  03/30/2021  ? Normal LVEF ~53%. 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 No Scar or Late Gadolinium Enhancement on LV Myocardium.  ? CARPAL TUNNEL RELEASE  Right 11/01/2014  ? COLONOSCOPY    ? DILATION AND CURETTAGE OF UTERUS    ? ECTOPIC PREGNANCY SURGERY  X 2  ? ESOPHAGOGASTRODUODENOSCOPY (EGD) WITH PROPOFOL N/A 04/03/2021  ? Procedure: ESOPHAGOGASTRODUODENOSCOPY (EGD) WITH PROPOFOL;  Surgeon: AYetta Flock MD;  Location: [facility];  Service: Gastroenterology;  Laterality: N/A;  ? ESOPHAGOGASTRODUODENOSCOPY (EGD) WITH PROPOFOL N/A 08/15/2021  ? Procedure: ESOPHAGOGASTRODUODENOSCOPY (EGD) WITH PROPOFOL;  Surgeon: SLadene Artist MD;  Location: [facility];  Service: Endoscopy;  Laterality: N/A;  ? JOINT REPLACEMENT    ? KNEE ARTHROSCOPY Left 11/2014  ? RIGHT/LEFT HEART CATH AND CORONARY ANGIOGRAPHY N/A 08/08/2021  ? Procedure: RIGHT/LEFT HEART CATH AND CORONARY ANGIOGRAPHY;  Surgeon: HLeonie Man MD;  [facility];  Service: Cardiovascular;Ost LCx 40%. Ost-Prox RCA 35%. Prox-Mid LAD 25%-<25% D2. Mod-Severely Elevated LVEDP. Mild PA HTN -> PA mean 31 mmHg with a PCWP and  LVEDP of 23 mmHg  ? TEE WITHOUT CARDIOVERSION N/A 03/28/2021  ? Procedure: TRANSESOPHAGEAL ECHOCARDIOGRAM (TEE);  Surgeon: Thayer Headings, MD;  Location: [facility];; LVEF 55-60%. Normal LV Fxn & no RWMA. No LAA thrombus. Gr II Ao Plaque. Large circumscribed calcific mass (1.8 x 1.4 cm?) anterior to posterior annulus.  Fixed posterior leaflet with Mod-Severe MS (Mean MVG ~14 mmHg, Peak 20 mmhg).  Mild MR  ? TONSILLECTOMY    ? TOTAL KNEE ARTHROPLASTY Right 02/18/2015  ? Procedure: RIGHT TOTAL KNEE ARTHROPLASTY;  Surgeon: Netta Cedars, MD;  Location: [facility];  Service: Orthopedics;  Laterality: Right;  ? TOTAL KNEE ARTHROPLASTY Left 08/19/2015  ? Procedure: LEFT TOTAL KNEE ARTHROPLASTY;  Surgeon: Netta Cedars, MD;  Location: [facility];  Service: Orthopedics;  Laterality: Left;  ? TRANSTHORACIC ECHOCARDIOGRAM  11/2016; 07/2017:  ? a) normal LV size, thickness and function. EF 55-60%. GR 1 DD.No RWMA severe mitral annular calcification, but no mitral stenosis. Mild LA  dilation;; b) normal LV size and function.  EF 65-75%.  Unable to assess diastolic function.  Aortic sclerosis with no stenosis.  Moderate mitral stenosis? (Gradient 9 mmHg) -?  Mild-mod left atrial enlargement   ? TRANST

## 2022-01-18 NOTE — Patient Instructions (Signed)
It was nice to meet you today.  I am glad to hear that you are feeling better.  Your tremor may be related to blood sugar fluctuation or stress or dehydration, not sleeping well.  Probably from a combination of factors.  I would not recommend any new medication for your tremors especially since you feel improved and since your hand tremor is rather mild at this time. ?I do not see any signs or symptoms of parkinson's like disease or what we call parkinsonism.  ? ?We do not have to make a follow up appointment.  ? ?Please remember, that any kind of tremor may be exacerbated by anxiety, anger, nervousness, excitement, dehydration, sleep deprivation, by caffeine, thyroid disease, and low blood sugar values or blood sugar fluctuations. Some medications can exacerbate tremors.  ? ?Please talk to your pulmonologist/lung doctor about restarting your CPAP machine or getting retested as you had discussed previously.  ? ?Please follow-up with your other specialists as scheduled. ?

## 2022-01-22 ENCOUNTER — Other Ambulatory Visit: Payer: Self-pay | Admitting: Family Medicine

## 2022-01-22 DIAGNOSIS — E1169 Type 2 diabetes mellitus with other specified complication: Secondary | ICD-10-CM

## 2022-01-22 DIAGNOSIS — I214 Non-ST elevation (NSTEMI) myocardial infarction: Secondary | ICD-10-CM

## 2022-01-24 DIAGNOSIS — M0579 Rheumatoid arthritis with rheumatoid factor of multiple sites without organ or systems involvement: Secondary | ICD-10-CM | POA: Diagnosis not present

## 2022-01-24 DIAGNOSIS — R5382 Chronic fatigue, unspecified: Secondary | ICD-10-CM | POA: Diagnosis not present

## 2022-01-24 DIAGNOSIS — J704 Drug-induced interstitial lung disorders, unspecified: Secondary | ICD-10-CM | POA: Diagnosis not present

## 2022-01-24 DIAGNOSIS — M2550 Pain in unspecified joint: Secondary | ICD-10-CM | POA: Diagnosis not present

## 2022-01-28 ENCOUNTER — Encounter: Payer: Self-pay | Admitting: Family Medicine

## 2022-01-30 NOTE — Progress Notes (Signed)
Therapist, music at [facility] ?[address], [address] ?[place], [place] [address] ?[phone] ?Fax [phone] ? ?Date:  02/01/2022  ? ?Name:  Lynn [name]   DOB:  1956/03/15   MRN:  [id number] ? ?PCP:  [name], MD  ? ? ?Chief Complaint: Form Completion (Pt needs forms filled out to carry o2 on a plane. /Concerns/ questions: 1. review calcium labs from Rheumatology. 2.Apt scheduled for 02/01/22 for Nephrology) ? ? ?History of Present Illness: ? ?Lynn [name] is a 66 y.o. very pleasant female patient who presents with the following: ? ?Patient seen today for follow-up- -history of paroxysmal atrial tach and fibrillation, on diltiazem and Eliquis, interstitial lung disease and COPD on oxygen, diabetes, history of CVA, rheumatoid arthritis, hypertension, thalassemia-I am unsure what type of thalassemia ? ?Most recent visit with myself was in February ?She had a brief admission earlier this year due to syncope thought due to medication misunderstanding /diuretic overuse and orthostatic hypotension ? ?She was seen by cardiology last month, Glenetta Hew is her primary cardiologist ?1. Moderate mitral stenosis by prior echocardiography   ?2. Chest pain of uncertain etiology   ?3. Coronary artery disease, non-occlusive   ?4. Coronary artery spasm ([place])   ?5. Hyperlipidemia associated with type 2 diabetes mellitus ([place])   ?6. Essential hypertension   ?7. PAF (paroxysmal atrial fibrillation) ([place]); CHA2DS2-VASc score 7   ?8. Chronic anticoagulation   ?9. Chronic diastolic CHF (congestive heart failure) ([place])   ?10. Chronic respiratory failure, unspecified whether with hypoxia or hypercapnia ([place])   ?11. Stage 3 chronic kidney disease, unspecified whether stage 3a or 3b CKD ([place])   ?12. Chronic low back pain, unspecified back pain laterality, unspecified whether sciatica present   ?  ?PLAN:   ? ?Moderate-severe MR, MAC ?- echo and MRI with posterior leaflet mass that is heavy  calcification - not felt to be a mass ?- Echocardiogram January 2023 --> we will ask Dr. Ellyn Hack to review this ultrasound ?- No shortness of breath or syncope ?Chest pain ?CAD ?Coronary vasospasm / microvascular disease ?Hypertension ?- continue statin, losartan, cardizem ?- heart cath 2022 with no obstructive disease, suspect microvascular disease ?- suspect her chest pain is radiating from her chronic back pain - reviewed her last heart cath with her and her medication regimen ?Paroxysmal atrial fibrillation ?- diagnosed 03/26/21 ?- continue home cardizem - preferred over BB for microvascular disease ?Need for oral anticoagulation ?This patients CHA2DS2-VASc Score and unadjusted Ischemic Stroke Rate (% per year) is equal to 7.2 % stroke rate/year from a score of 5 (HTN, DM, female, age, CAD, HFpEF) ?- she is doing well on eliquis ?Chronic diastolic heart failure ?Chronic respiratory failure on home O2 ?- now taking 10 mg lasix daily with an extra 10 mg PRN for weight gain ?- clarified PRN dosing ?CKD stage III ?- PCP is referring to nephrology ? ?Second dose Shingrix- reminded pt  ?Eye exam- reminded pt and she plans to do this  ?Can update urine microalbumin at her convenience ?She is doing to [place] in May- she will fly to [place] and pick up the ship there ?She needs paperwork to use her oxygen on the plane -this will be her first time flying with oxygen, she is not sure if she will need it for the entire flight or just at times.  She uses for liters for walking ?She is seeing nephrology on 4/26 ?Patient Active Problem List  ? Diagnosis Date  Noted  ? Acute on chronic respiratory failure with hypoxia ([place]) 11/14/2021  ? AKI (acute kidney injury) ([place])   ? Syncope 11/13/2021  ? Acute gastric ulcer without hemorrhage or perforation   ? Ulcer of esophagus without bleeding   ? PAF (paroxysmal atrial fibrillation) ([place]); CHA2DS2-VASc score 7 07/05/2021  ? Anemia   ? Acute gastric ulcer with hemorrhage   ? Occult GI  bleeding   ? Melena   ? Preop respiratory exam   ? ILD (interstitial lung disease) ([place])   ? SOB (shortness of breath) 03/24/2021  ? FUO (fever of unknown origin) 03/24/2021  ? Hypokalemia 03/24/2021  ? Generalized weakness 03/24/2021  ? Coronary artery spasm ([place]) 08/09/2020  ? Rheumatoid arthritis ([place]) 11/09/2019  ? Toe pain, bilateral 05/26/2019  ? Type 2 diabetes mellitus with hyperglycemia, with long-term current use of insulin ([place]) 05/26/2019  ? Mobility impaired 04/01/2019  ? Morbid obesity due to excess calories ([place]) 12/03/2017  ? DOE (dyspnea on exertion) 12/02/2017  ? Moderate mitral stenosis by prior echocardiography 08/23/2017  ? Chronic diastolic CHF (congestive heart failure) ([place]) 08/12/2017  ? Chronic respiratory failure with hypoxia ([place]) 08/12/2017  ? OSA on CPAP 08/12/2017  ? COPD with acute bronchitis ([place]) 08/12/2017  ? Near syncope 07/03/2017  ? Anxiety 07/03/2017  ? Coronary artery disease, non-occlusive 12/08/2016  ? PAT (paroxysmal atrial tachycardia) ([place])   ? H/O total knee replacement 08/19/2015  ? Vision, loss, sudden 03/07/2015  ? S/P TKR (total knee replacement) using cement 02/18/2015  ? Thalassemia 09/02/2013  ? Hyperlipidemia associated with type 2 diabetes mellitus ([place]) 08/24/2012  ? Essential hypertension 07/22/2012  ? ? ?Past Medical History:  ?Diagnosis Date  ? Anxiety   ? Clotting disorder ([place])   ? blood clot in eye 2016  ? Coronary artery disease, non-occlusive   ? a. 11/2016 NSTEMI/Cath: LM nl, LAD 40p, 25m, D1/2 small, LCX 40ost, OM2/3 nl/small, RCA 35 ost/mid, RPDA/RPL small/nl.  ? Coronary vasospasm ([facility])   ? Depression   ? Diastolic dysfunction   ? a. 11/2016 Echo: EF 55-60%, gr1 DD, sev calcified MV annulus, mildly dil LA.  ? Eye hemorrhage 01/2015  ? "right; resolved" (07/03/2017)  ? Headache   ? "monthly" (07/03/2017)  ? History of blood transfusion   ? "when I had an ectopic pregnancy"  ? History of stomach ulcers   ? Hyperlipidemia   ? Hypertension   ? Microcytic  anemia   ? Moderate mitral stenosis by prior echocardiogram 03/2021  ? TTE: Mod MS (mean gradient 9 mmHg). -- TEE Moderate-Severe MS (mean gradient 14 mmHg) with fixed Post MV Leaflet & Severe MAC w/ large circumscribed calcified mass on the Post MV Leaflet. (Mass previously described in 2018) - CMRI identifies calcified mass & not Tumor.; R&LHC - LVEDP & PCWP both 23 mmHg indicating minimal resting gradient  ? OSA on CPAP   ? setting is unknown  ? PAF (paroxysmal atrial fibrillation) ([place] 03/2021  ? Event Monitor Aug-Sept 2022: Predominantly sinus rhythm.  1% A. fib burden.  30 brief episodes of PAT.  2 short bursts of NSVT.  ? PAT (paroxysmal atrial tachycardia) ([place]   ? Event monitor from August 2022 showed 30 brief bursts longest 14 seconds.  ? Pneumonia   ? "several times" (07/03/2017)  ? PVC's (premature ventricular contractions)   ? Rheumatoid arthritis ([place]   ? Syncope 07/03/2017 X 2  ? called seizures but no medications  ? Thalassemia   ? "my cells are sickle  cell shaped but I don't have sickle cell anemia" (07/03/2017)  ? Type II diabetes mellitus ([place])   ? ? ?Past Surgical History:  ?Procedure Laterality Date  ? 48-Hour Monitor  03/18/2017  ? Sinus rhythm with sinus tachycardia (rate 58-134 BPM)multiple PVCs noted with couplets and bigeminy. One triplet. 7 runs of PAT ranging from 100-130 bpm. Longest was 33 beats.  ? BIOPSY  04/03/2021  ? Procedure: BIOPSY;  Surgeon: Yetta Flock, MD;  Location: [facility];  Service: Gastroenterology;;  ? BREAST BIOPSY Left   ? BRONCHIAL WASHINGS  03/28/2021  ? Procedure: BRONCHIAL WASHINGS;  Surgeon: Julian Hy, DO;  Location: [facility];  Service: Endoscopy;;  ? CARDIAC CATHETERIZATION N/A 11/19/2016  ? Procedure: Left Heart Cath and Coronary Angiography;  Surgeon: Belva Crome, MD;  Location: [facility];  Service: Cardiovascular.    LM nl, LAD 40p, 42m, D1/2 small, LCX 40ost, OM2/3 nl/small, RCA 35 ost/mid, RPDA/RPL small/nl.  ? CARDIAC MRI   03/30/2021  ? Normal LVEF ~53%. 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 No Scar or Late Gadolinium Enhancement on LV Myocardium.  ? CARPAL TUNNEL RELEASE Right 01

## 2022-02-01 ENCOUNTER — Ambulatory Visit (INDEPENDENT_AMBULATORY_CARE_PROVIDER_SITE_OTHER): Payer: Medicare Other | Admitting: Family Medicine

## 2022-02-01 VITALS — BP 122/78 | HR 99 | Temp 98.3°F | Resp 18 | Ht 63.0 in | Wt 245.2 lb

## 2022-02-01 DIAGNOSIS — J849 Interstitial pulmonary disease, unspecified: Secondary | ICD-10-CM

## 2022-02-01 DIAGNOSIS — D569 Thalassemia, unspecified: Secondary | ICD-10-CM | POA: Diagnosis not present

## 2022-02-01 DIAGNOSIS — Z9981 Dependence on supplemental oxygen: Secondary | ICD-10-CM | POA: Diagnosis not present

## 2022-02-01 DIAGNOSIS — D509 Iron deficiency anemia, unspecified: Secondary | ICD-10-CM | POA: Diagnosis not present

## 2022-02-01 NOTE — Patient Instructions (Signed)
It was good to see you today- have a wonderful time on your vacation ? ?We will set you up to see Dr Marin Olp when you come back from your trip, to follow-up on your anemia  ? ? ?

## 2022-02-04 DIAGNOSIS — Z794 Long term (current) use of insulin: Secondary | ICD-10-CM | POA: Diagnosis not present

## 2022-02-04 DIAGNOSIS — E1165 Type 2 diabetes mellitus with hyperglycemia: Secondary | ICD-10-CM | POA: Diagnosis not present

## 2022-02-05 ENCOUNTER — Other Ambulatory Visit: Payer: Self-pay | Admitting: Family

## 2022-02-05 DIAGNOSIS — D649 Anemia, unspecified: Secondary | ICD-10-CM

## 2022-02-06 ENCOUNTER — Inpatient Hospital Stay: Payer: Medicare Other | Attending: Hematology & Oncology

## 2022-02-06 ENCOUNTER — Other Ambulatory Visit: Payer: Self-pay

## 2022-02-06 ENCOUNTER — Encounter: Payer: Self-pay | Admitting: Family

## 2022-02-06 ENCOUNTER — Inpatient Hospital Stay: Payer: Medicare Other | Admitting: Family

## 2022-02-06 VITALS — BP 106/46 | HR 85 | Temp 98.2°F | Resp 18 | Ht 63.0 in | Wt 244.0 lb

## 2022-02-06 DIAGNOSIS — Z87891 Personal history of nicotine dependence: Secondary | ICD-10-CM | POA: Diagnosis not present

## 2022-02-06 DIAGNOSIS — E119 Type 2 diabetes mellitus without complications: Secondary | ICD-10-CM | POA: Diagnosis not present

## 2022-02-06 DIAGNOSIS — D649 Anemia, unspecified: Secondary | ICD-10-CM

## 2022-02-06 DIAGNOSIS — D509 Iron deficiency anemia, unspecified: Secondary | ICD-10-CM | POA: Diagnosis not present

## 2022-02-06 DIAGNOSIS — Z794 Long term (current) use of insulin: Secondary | ICD-10-CM | POA: Diagnosis not present

## 2022-02-06 DIAGNOSIS — Z801 Family history of malignant neoplasm of trachea, bronchus and lung: Secondary | ICD-10-CM | POA: Diagnosis not present

## 2022-02-06 DIAGNOSIS — Z7901 Long term (current) use of anticoagulants: Secondary | ICD-10-CM | POA: Diagnosis not present

## 2022-02-06 LAB — FERRITIN: Ferritin: 39 ng/mL (ref 11–307)

## 2022-02-06 LAB — CMP (CANCER CENTER ONLY)
ALT: 12 U/L (ref 0–44)
AST: 13 U/L — ABNORMAL LOW (ref 15–41)
Albumin: 4.2 g/dL (ref 3.5–5.0)
Alkaline Phosphatase: 74 U/L (ref 38–126)
Anion gap: 10 (ref 5–15)
BUN: 18 mg/dL (ref 8–23)
CO2: 31 mmol/L (ref 22–32)
Calcium: 10.1 mg/dL (ref 8.9–10.3)
Chloride: 96 mmol/L — ABNORMAL LOW (ref 98–111)
Creatinine: 1.3 mg/dL — ABNORMAL HIGH (ref 0.44–1.00)
GFR, Estimated: 45 mL/min — ABNORMAL LOW (ref >60)
Glucose, Bld: 219 mg/dL — ABNORMAL HIGH (ref 70–99)
Potassium: 3.5 mmol/L (ref 3.5–5.1)
Sodium: 137 mmol/L (ref 135–145)
Total Bilirubin: 0.4 mg/dL (ref 0.3–1.2)
Total Protein: 7.9 g/dL (ref 6.5–8.1)

## 2022-02-06 LAB — CBC WITH DIFFERENTIAL (CANCER CENTER ONLY)
Abs Immature Granulocytes: 0.13 10*3/uL — ABNORMAL HIGH (ref 0.00–0.07)
Basophils Absolute: 0.1 10*3/uL (ref 0.0–0.1)
Basophils Relative: 1 %
Eosinophils Absolute: 0.1 10*3/uL (ref 0.0–0.5)
Eosinophils Relative: 1 %
HCT: 30.9 % — ABNORMAL LOW (ref 36.0–46.0)
Hemoglobin: 9.2 g/dL — ABNORMAL LOW (ref 12.0–15.0)
Immature Granulocytes: 1 %
Lymphocytes Relative: 13 %
Lymphs Abs: 1.2 10*3/uL (ref 0.7–4.0)
MCH: 18.6 pg — ABNORMAL LOW (ref 26.0–34.0)
MCHC: 29.8 g/dL — ABNORMAL LOW (ref 30.0–36.0)
MCV: 62.6 fL — ABNORMAL LOW (ref 80.0–100.0)
Monocytes Absolute: 0.8 10*3/uL (ref 0.1–1.0)
Monocytes Relative: 9 %
Neutro Abs: 7.1 10*3/uL (ref 1.7–7.7)
Neutrophils Relative %: 75 %
Platelet Count: 275 10*3/uL (ref 150–400)
RBC: 4.94 MIL/uL (ref 3.87–5.11)
RDW: 17.3 % — ABNORMAL HIGH (ref 11.5–15.5)
WBC Count: 9.5 10*3/uL (ref 4.0–10.5)
nRBC: 0 % (ref 0.0–0.2)

## 2022-02-06 LAB — RETICULOCYTES
Immature Retic Fract: 22.2 % — ABNORMAL HIGH (ref 2.3–15.9)
RBC.: 4.94 MIL/uL (ref 3.87–5.11)
Retic Count, Absolute: 104.2 10*3/uL (ref 19.0–186.0)
Retic Ct Pct: 2.1 % (ref 0.4–3.1)

## 2022-02-06 LAB — SAVE SMEAR(SSMR), FOR PROVIDER SLIDE REVIEW

## 2022-02-06 LAB — LACTATE DEHYDROGENASE: LDH: 192 U/L (ref 98–192)

## 2022-02-07 DIAGNOSIS — E1122 Type 2 diabetes mellitus with diabetic chronic kidney disease: Secondary | ICD-10-CM | POA: Diagnosis not present

## 2022-02-07 DIAGNOSIS — I129 Hypertensive chronic kidney disease with stage 1 through stage 4 chronic kidney disease, or unspecified chronic kidney disease: Secondary | ICD-10-CM | POA: Diagnosis not present

## 2022-02-07 DIAGNOSIS — I5032 Chronic diastolic (congestive) heart failure: Secondary | ICD-10-CM | POA: Diagnosis not present

## 2022-02-07 DIAGNOSIS — N179 Acute kidney failure, unspecified: Secondary | ICD-10-CM | POA: Diagnosis not present

## 2022-02-07 DIAGNOSIS — N1832 Chronic kidney disease, stage 3b: Secondary | ICD-10-CM | POA: Diagnosis not present

## 2022-02-07 LAB — IRON AND IRON BINDING CAPACITY (CC-WL,HP ONLY)
Iron: 49 ug/dL (ref 28–170)
Saturation Ratios: 13 % (ref 10.4–31.8)
TIBC: 364 ug/dL (ref 250–450)
UIBC: 315 ug/dL

## 2022-02-07 LAB — ERYTHROPOIETIN: Erythropoietin: 41.1 m[IU]/mL — ABNORMAL HIGH (ref 2.6–18.5)

## 2022-02-07 NOTE — Progress Notes (Signed)
Hematology/Oncology Consultation  ? ?Name: Jimena Wieczorek      MRN: [id number]    Location: Room/bed info not found  Date: 02/07/2022 Time:12:43 PM ? ? ?REFERRING PHYSICIAN: Silvestre Mesi, MD ? ?REASON FOR CONSULT: Thalassemia unspecified, microcytic anemia ?  ?DIAGNOSIS:Microcytic anemia, significant family history of thalassemia anemia  ? ?HISTORY OF PRESENT ILLNESS:  Ms. Macbeth is a very pleasant 66 yo African American female with long history of microcytic anemia.  ?She recently started taking Folic acid 2 mg PO daily and will continue this.  ?She states that her mother and sisters have thalassemia anemia and several other of her mothers relatives have as well.  ?She has not noted any episodes of blood loss. No bruising or petechiae.  ?She is symptomatic with fatigue (trouble sleeping), occasional dizziness, palpitations, chest discomfort, headache and nose bleeds.  ?She has SOB secondary to COPD exacerbation and is on 4 L [place] supplemental O2 during the day and wears a CPAP at night.  ?She had endoscopy last June (2022) which showed a hiatal hernia, a non bleeding esophageal ulcer and 2 non bleeding small gastric ulcers. Biopsies at that time were negative.  ?Repeat EGD in November (2022) showed mild erosive antral gastropathy with no bleeding.  ?She takes Protonix PO daily.  ?She has 1 son and history of 2 ectopic pregnancies as well as 1 miscarriage. She required a blood transfusion once with one of her ectopic pregnancies.  ?She had a total hysterectomy at around 66 yo.  ?No personal history of cancer. Her mother had lymphoma and her father had lung cancer.  ?She had her mammogram in September 2022 and result was negative.  ?She was previously on Methotrexate for RA. This was discontinued due to negative side effects on the lungs. She is now on [place].   ?She has a type II diabetic and insulin dependant.  ?No history of thyroid disease.  ?No fever, chills, n/v, cough, rash, abdominal pain or changes  in bowel or bladder habits.  ?Neuropathy in her hands and feet is unchanged from baseline. She also states that she has restless legs at night.  ?No swelling in her extremities at this time. ?She quit smoking 5 years ago.  ?No recreational drug or ETOH use.  ?Her appetite is ok and she is ding her best to stay well hydrated.  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  ?She is originally from [place] and moved to [facility] for her husband's job.  ? ?ROS: All other 10 point review of systems is negative.  ? ?PAST MEDICAL HISTORY:   ?Past Medical History:  ?Diagnosis Date  ? Anxiety   ? Clotting disorder ([place])   ? blood clot in eye 2016  ? Coronary artery disease, non-occlusive   ? a. 11/2016 NSTEMI/Cath: LM nl, LAD 40p, 17m, D1/2 small, LCX 40ost, OM2/3 nl/small, RCA 35 ost/mid, RPDA/RPL small/nl.  ? Coronary vasospasm ([facility])   ? Depression   ? Diastolic dysfunction   ? a. 11/2016 Echo: EF 55-60%, gr1 DD, sev calcified MV annulus, mildly dil LA.  ? Eye hemorrhage 01/2015  ? "right; resolved" (07/03/2017)  ? Headache   ? "monthly" (07/03/2017)  ? History of blood transfusion   ? "when I had an ectopic pregnancy"  ? History of stomach ulcers   ? Hyperlipidemia   ? Hypertension   ? Microcytic anemia   ? Moderate mitral stenosis by prior echocardiogram 03/2021  ? TTE: Mod MS (mean gradient 9 mmHg). --  TEE Moderate-Severe MS (mean gradient 14 mmHg) with fixed Post MV Leaflet & Severe MAC w/ large circumscribed calcified mass on the Post MV Leaflet. (Mass previously described in 2018) - CMRI identifies calcified mass & not Tumor.; R&LHC - LVEDP & PCWP both 23 mmHg indicating minimal resting gradient  ? OSA on CPAP   ? setting is unknown  ? PAF (paroxysmal atrial fibrillation) ([place]) 03/2021  ? Event Monitor Aug-Sept 2022: Predominantly sinus rhythm.  1% A. fib burden.  30 brief episodes of PAT.  2 short bursts of NSVT.  ? PAT (paroxysmal atrial tachycardia) ([place])   ? Event monitor from August 2022 showed 30  brief bursts longest 14 seconds.  ? Pneumonia   ? "several times" (07/03/2017)  ? PVC's (premature ventricular contractions)   ? Rheumatoid arthritis ([place])   ? Syncope 07/03/2017 X 2  ? called seizures but no medications  ? Thalassemia   ? "my cells are sickle cell shaped but I don't have sickle cell anemia" (07/03/2017)  ? Type II diabetes mellitus ([place])   ? ? ?ALLERGIES: ?Allergies  ?Allergen Reactions  ? Methotrexate Other (See Comments)  ?  Pneumonitis  ? Penicillins Hives  ?  Childhood allergy ?Has patient had a PCN reaction causing immediate rash, facial/tongue/throat swelling, SOB or lightheadedness with hypotension: Yes ?Has patient had a PCN reaction causing severe rash involving mucus membranes or skin necrosis: No ?Has patient had a PCN reaction that required hospitalization No ?Has patient had a PCN reaction occurring within the last 10 years: No ?If all of the above answers are "NO", then may proceed with Cephalosporin use.  ? [name] [Dapagliflozin] Other (See Comments)  ?  Dizzy and lethargic ?  ? Metformin And Related Other (See Comments)  ?  Diarrhea, bleeding  ? Milk [Lac Bovis] Diarrhea  ?  Milk and dairy products  ? ?   ?MEDICATIONS:  ?Current Outpatient Medications on File Prior to Visit  ?Medication Sig Dispense Refill  ? acetaminophen (TYLENOL) 500 MG tablet Take 500 mg by mouth every 6 (six) hours as needed for fever.    ? albuterol (VENTOLIN HFA) 108 (90 Base) MCG/ACT inhaler Inhale 2 puffs into the lungs every 6 (six) hours as needed for wheezing or shortness of breath. 8 g 2  ? apixaban (ELIQUIS) 5 MG TABS tablet Take 1 tablet (5 mg total) by mouth 2 (two) times daily. 160 tablet 3  ? atorvastatin (LIPITOR) 80 MG tablet Take 1 tablet (80 mg total) by mouth every evening. 90 tablet 0  ? cetirizine (ZYRTEC) 10 MG tablet Take 10 mg by mouth daily as needed for allergies.    ? Continuous Blood Gluc Sensor (DEXCOM G6 SENSOR) MISC 1 Device by Does not apply route as directed. 3 each 6  ?  Continuous Blood Gluc Transmit (DEXCOM G6 TRANSMITTER) MISC 1 Device by Does not apply route as directed. 1 each 3  ? diltiazem (CARDIZEM CD) 360 MG 24 hr capsule Take 1 capsule (360 mg total) by mouth daily. 90 capsule 3  ? folic acid (FOLVITE) 1 MG tablet Take 2 tablets (2 mg total) by mouth daily. (Patient taking differently: Take 1 mg by mouth in the morning and at bedtime.) 180 tablet 3  ? furosemide (LASIX) 20 MG tablet Decrease Lasix ( furosemide)  to 20 mg ( 1/2/tablet)  , with 20 mg  you may take a an additional 20 mg if need for swelling or weight gain more than 3 lbs overnight or 5 lbs  in one week -- if you have continue to take an additional 20 mg , go back to 40 mg  Take additional dose as needed for egt gain> 3 lb in 1 day    ? glucose blood (CONTOUR NEXT TEST) test strip 3x daily 300 each 11  ? hydrochlorothiazide (HYDRODIURIL) 12.5 MG tablet hydrochlorothiazide 12.5 mg tablet ? TAKE 1 TABLET BY MOUTH EVERY DAY    ? Insulin Disposable Pump (OMNIPOD DASH PODS, GEN 4,) MISC CHANGE EVERY 2 DAYS 45 each PRN  ? insulin lispro (HUMALOG) 100 UNIT/ML injection Max Daily 100 units per pump 90 mL 3  ? Insulin Pen Needle (B-D UF III MINI PEN NEEDLES) 31G X 5 MM MISC USE DAILY WITH VICTOZA AND LANTUS. 400 each 1  ? Insulin Pen Needle 31G X 5 MM MISC 1 Device by Does not apply route 3 (three) times daily. 150 each 11  ? Lancets 30G MISC 1 Device by Does not apply route 3 (three) times daily. 300 each 9  ? leflunomide (ARAVA) 20 MG tablet Take 20 mg by mouth daily.    ? losartan (COZAAR) 50 MG tablet Take 1 tablet (50 mg total) by mouth daily. 90 tablet 3  ? Melatonin 5 MG CHEW Chew 5 mg by mouth at bedtime.    ? Multiple Vitamin (MULTIVITAMIN WITH MINERALS) TABS tablet Take 1 tablet by mouth daily.    ? Oxymetazoline HCl (VICKS SINEX NA) Place 1 spray into the nose daily as needed (congestion).    ? pantoprazole (PROTONIX) 40 MG tablet Take 1 tablet (40 mg total) by mouth 2 (two) times daily before a meal. 60  tablet 11  ? SAFETY-LOK TB SYRINGE 27GX.5" 27G X 1/2" 1 ML MISC     ? Semaglutide, 2 MG/DOSE, 8 MG/3ML SOPN Inject 2 mg as directed once a week. 9 mL 3  ? traZODone (DESYREL) 50 MG tablet TAKE 1/2 TO 1 TABLET BY MOUT

## 2022-02-08 LAB — HGB FRACTIONATION CASCADE
Hgb A2: 4.7 % — ABNORMAL HIGH (ref 1.8–3.2)
Hgb A: 95.3 % — ABNORMAL LOW (ref 96.4–98.8)
Hgb F: 0 % (ref 0.0–2.0)
Hgb S: 0 %

## 2022-02-09 ENCOUNTER — Other Ambulatory Visit: Payer: Self-pay | Admitting: Family

## 2022-02-09 DIAGNOSIS — D500 Iron deficiency anemia secondary to blood loss (chronic): Secondary | ICD-10-CM

## 2022-02-09 DIAGNOSIS — D509 Iron deficiency anemia, unspecified: Secondary | ICD-10-CM | POA: Insufficient documentation

## 2022-02-14 ENCOUNTER — Telehealth: Payer: Self-pay | Admitting: *Deleted

## 2022-02-14 ENCOUNTER — Other Ambulatory Visit: Payer: Self-pay

## 2022-02-14 NOTE — Telephone Encounter (Signed)
Called and lvm for call back to schedule (2) doses of IV Iron ?

## 2022-02-15 ENCOUNTER — Telehealth: Payer: Self-pay | Admitting: *Deleted

## 2022-02-15 LAB — ALPHA-THALASSEMIA GENOTYPR

## 2022-02-15 NOTE — Telephone Encounter (Signed)
Per 02/06/22 los - called and gave upcoming appointments - confirmed ?

## 2022-02-20 ENCOUNTER — Inpatient Hospital Stay: Payer: Medicare Other | Attending: Hematology & Oncology

## 2022-02-20 VITALS — BP 99/50 | HR 71 | Temp 97.7°F | Resp 18

## 2022-02-20 DIAGNOSIS — K2210 Ulcer of esophagus without bleeding: Secondary | ICD-10-CM

## 2022-02-20 DIAGNOSIS — K253 Acute gastric ulcer without hemorrhage or perforation: Secondary | ICD-10-CM

## 2022-02-20 DIAGNOSIS — D509 Iron deficiency anemia, unspecified: Secondary | ICD-10-CM | POA: Diagnosis not present

## 2022-02-20 DIAGNOSIS — D500 Iron deficiency anemia secondary to blood loss (chronic): Secondary | ICD-10-CM

## 2022-02-20 MED ORDER — SODIUM CHLORIDE 0.9 % IV SOLN: Freq: Once | INTRAVENOUS | Status: AC

## 2022-02-20 MED ORDER — SODIUM CHLORIDE 0.9 % IV SOLN
510.0000 mg | Freq: Once | INTRAVENOUS | Status: AC
  Administered 2022-02-20: 510 mg via INTRAVENOUS
  Filled 2022-02-20: qty 510

## 2022-02-20 NOTE — Patient Instructions (Signed)

## 2022-02-22 DIAGNOSIS — Z794 Long term (current) use of insulin: Secondary | ICD-10-CM | POA: Diagnosis not present

## 2022-02-22 DIAGNOSIS — E1165 Type 2 diabetes mellitus with hyperglycemia: Secondary | ICD-10-CM | POA: Diagnosis not present

## 2022-03-08 ENCOUNTER — Inpatient Hospital Stay: Payer: Medicare Other

## 2022-03-08 VITALS — BP 113/44 | HR 76 | Temp 97.9°F | Resp 19

## 2022-03-08 DIAGNOSIS — D500 Iron deficiency anemia secondary to blood loss (chronic): Secondary | ICD-10-CM

## 2022-03-08 DIAGNOSIS — D509 Iron deficiency anemia, unspecified: Secondary | ICD-10-CM | POA: Diagnosis not present

## 2022-03-08 DIAGNOSIS — K253 Acute gastric ulcer without hemorrhage or perforation: Secondary | ICD-10-CM

## 2022-03-08 DIAGNOSIS — K2210 Ulcer of esophagus without bleeding: Secondary | ICD-10-CM

## 2022-03-08 MED ORDER — SODIUM CHLORIDE 0.9 % IV SOLN: Freq: Once | INTRAVENOUS | Status: AC

## 2022-03-08 MED ORDER — SODIUM CHLORIDE 0.9 % IV SOLN
510.0000 mg | Freq: Once | INTRAVENOUS | Status: AC
  Administered 2022-03-08: 510 mg via INTRAVENOUS
  Filled 2022-03-08: qty 510

## 2022-03-25 DIAGNOSIS — E1165 Type 2 diabetes mellitus with hyperglycemia: Secondary | ICD-10-CM | POA: Diagnosis not present

## 2022-03-25 DIAGNOSIS — Z794 Long term (current) use of insulin: Secondary | ICD-10-CM | POA: Diagnosis not present

## 2022-04-09 ENCOUNTER — Encounter: Payer: Self-pay | Admitting: Family

## 2022-04-09 ENCOUNTER — Inpatient Hospital Stay: Payer: Medicare Other | Attending: Hematology & Oncology

## 2022-04-09 ENCOUNTER — Inpatient Hospital Stay: Payer: Medicare Other | Admitting: Family

## 2022-04-09 VITALS — BP 136/65 | HR 78 | Temp 98.1°F | Resp 18 | Wt 242.0 lb

## 2022-04-09 DIAGNOSIS — D500 Iron deficiency anemia secondary to blood loss (chronic): Secondary | ICD-10-CM | POA: Diagnosis not present

## 2022-04-09 DIAGNOSIS — D649 Anemia, unspecified: Secondary | ICD-10-CM

## 2022-04-09 DIAGNOSIS — D509 Iron deficiency anemia, unspecified: Secondary | ICD-10-CM | POA: Insufficient documentation

## 2022-04-09 DIAGNOSIS — Z79899 Other long term (current) drug therapy: Secondary | ICD-10-CM | POA: Insufficient documentation

## 2022-04-09 LAB — CBC WITH DIFFERENTIAL (CANCER CENTER ONLY)
Abs Immature Granulocytes: 0.03 10*3/uL (ref 0.00–0.07)
Basophils Absolute: 0.1 10*3/uL (ref 0.0–0.1)
Basophils Relative: 1 %
Eosinophils Absolute: 0.4 10*3/uL (ref 0.0–0.5)
Eosinophils Relative: 5 %
HCT: 30 % — ABNORMAL LOW (ref 36.0–46.0)
Hemoglobin: 8.9 g/dL — ABNORMAL LOW (ref 12.0–15.0)
Immature Granulocytes: 0 %
Lymphocytes Relative: 16 %
Lymphs Abs: 1.3 10*3/uL (ref 0.7–4.0)
MCH: 18.7 pg — ABNORMAL LOW (ref 26.0–34.0)
MCHC: 29.7 g/dL — ABNORMAL LOW (ref 30.0–36.0)
MCV: 63.2 fL — ABNORMAL LOW (ref 80.0–100.0)
Monocytes Absolute: 0.7 10*3/uL (ref 0.1–1.0)
Monocytes Relative: 9 %
Neutro Abs: 5.7 10*3/uL (ref 1.7–7.7)
Neutrophils Relative %: 69 %
Platelet Count: 228 10*3/uL (ref 150–400)
RBC: 4.75 MIL/uL (ref 3.87–5.11)
RDW: 17.8 % — ABNORMAL HIGH (ref 11.5–15.5)
WBC Count: 8.2 10*3/uL (ref 4.0–10.5)
nRBC: 0.2 % (ref 0.0–0.2)

## 2022-04-09 LAB — CMP (CANCER CENTER ONLY)
ALT: 16 U/L (ref 0–44)
AST: 17 U/L (ref 15–41)
Albumin: 4.2 g/dL (ref 3.5–5.0)
Alkaline Phosphatase: 79 U/L (ref 38–126)
Anion gap: 11 (ref 5–15)
BUN: 17 mg/dL (ref 8–23)
CO2: 31 mmol/L (ref 22–32)
Calcium: 10 mg/dL (ref 8.9–10.3)
Chloride: 98 mmol/L (ref 98–111)
Creatinine: 1.14 mg/dL — ABNORMAL HIGH (ref 0.44–1.00)
GFR, Estimated: 52 mL/min — ABNORMAL LOW (ref >60)
Glucose, Bld: 195 mg/dL — ABNORMAL HIGH (ref 70–99)
Potassium: 3.3 mmol/L — ABNORMAL LOW (ref 3.5–5.1)
Sodium: 140 mmol/L (ref 135–145)
Total Bilirubin: 0.3 mg/dL (ref 0.3–1.2)
Total Protein: 7.5 g/dL (ref 6.5–8.1)

## 2022-04-09 LAB — RETICULOCYTES
Immature Retic Fract: 19.9 % — ABNORMAL HIGH (ref 2.3–15.9)
RBC.: 4.68 MIL/uL (ref 3.87–5.11)
Retic Count, Absolute: 84.2 10*3/uL (ref 19.0–186.0)
Retic Ct Pct: 1.8 % (ref 0.4–3.1)

## 2022-04-09 LAB — IRON AND IRON BINDING CAPACITY (CC-WL,HP ONLY)
Iron: 79 ug/dL (ref 28–170)
Saturation Ratios: 28 % (ref 10.4–31.8)
TIBC: 281 ug/dL (ref 250–450)
UIBC: 202 ug/dL (ref 148–442)

## 2022-04-09 LAB — FERRITIN: Ferritin: 478 ng/mL — ABNORMAL HIGH (ref 11–307)

## 2022-04-11 DIAGNOSIS — H40033 Anatomical narrow angle, bilateral: Secondary | ICD-10-CM | POA: Diagnosis not present

## 2022-04-11 DIAGNOSIS — E119 Type 2 diabetes mellitus without complications: Secondary | ICD-10-CM | POA: Diagnosis not present

## 2022-04-12 ENCOUNTER — Telehealth: Payer: Self-pay | Admitting: *Deleted

## 2022-04-12 ENCOUNTER — Ambulatory Visit: Payer: Medicare Other | Admitting: Internal Medicine

## 2022-04-12 ENCOUNTER — Encounter: Payer: Self-pay | Admitting: Internal Medicine

## 2022-04-12 VITALS — BP 126/82 | HR 88 | Wt 246.0 lb

## 2022-04-12 DIAGNOSIS — Z794 Long term (current) use of insulin: Secondary | ICD-10-CM

## 2022-04-12 DIAGNOSIS — E1165 Type 2 diabetes mellitus with hyperglycemia: Secondary | ICD-10-CM

## 2022-04-12 LAB — POCT GLYCOSYLATED HEMOGLOBIN (HGB A1C): Hemoglobin A1C: 7.6 % — AB (ref 4.0–5.6)

## 2022-04-12 MED ORDER — OMNIPOD 5 DEXG7G6 PODS GEN 5 MISC
1.0000 | 3 refills | Status: DC
Start: 2022-04-12 — End: 2022-04-13

## 2022-04-12 MED ORDER — INSULIN LISPRO 100 UNIT/ML IJ SOLN: INTRAMUSCULAR | 3 refills | Status: DC

## 2022-04-12 MED ORDER — SEMAGLUTIDE (2 MG/DOSE) 8 MG/3ML ~~LOC~~ SOPN
2.0000 mg | PEN_INJECTOR | SUBCUTANEOUS | 3 refills | Status: DC
Start: 2022-04-12 — End: 2022-09-06

## 2022-04-12 MED ORDER — OMNIPOD 5 DEXG7G6 INTRO GEN 5 KIT
1.0000 | PACK | 0 refills | Status: DC
Start: 2022-04-12 — End: 2022-04-13

## 2022-04-12 NOTE — Progress Notes (Signed)
Name: Yehudis Monceaux  Age/ Sex: 66 y.o., female   MRN/ DOB: [id number], 1956/01/24     PCP: [name], MD   Reason for Endocrinology Evaluation: Type 2 Diabetes Mellitus  Initial Endocrine Consultative Visit: 03/31/2019    PATIENT IDENTIFIER: Ms. Cambri Plourde is a 66 y.o. female with a past medical history of T2DM, HTN, dyslipidemia,thalassemia, OSA on CPAP . The patient has followed with Endocrinology clinic since 03/31/2019 for consultative assistance with management of her diabetes.  DIABETIC HISTORY:  Ms. Brockwell was diagnosed with T2DM in 2010, She has been on metformin in the past with reported prior intolerance due to diarrhea. Was on Glipizide at some point with no side effects.Has been on insulin for years. Her hemoglobin A1c has ranged from  7.7% in 2018, peaking at 10.4% in 2020.  On her initial presentation to our clinic, her A1c was 10.4% , she was on Lantus and Victoza    [name] was tried in 03/2019 but developed severe dizziness .     Injectable methotrexate was started November 2020 due to rheumatoid arthritis.  Patient is intolerant to oral methotrexate   Started on Omnipod 10/2020  SUBJECTIVE:   During the last visit (03/18/2021): A1c 11.2 % we continued  Lantus and  Victoza, restarted Humalog      Today (04/12/2022): Ms. Suderman is here for a month follow up on diabetes management. She has the Omnipod. But was recently discharged from the hospital and this disrupted the pump use.  Presented to the ED 11/2021 for syncope , was discharged on Zio patch   She received iron infusions  She is off steroids  Tolerating Ozempic without side effects     This patient with type 2 diabetes is treated with Omnipod (insulin pump). During the visit the pump basal and bolus doses were reviewed including carb/insulin rations and supplemental doses. The clinical list was updated. The glucose meter download was reviewed in detail to determine if the  current pump settings are providing the best glycemic control without excessive hypoglycemia.  Pump and meter download:    Pump   Omnipod Settings   Insulin type   Humalog    Basal rate       0000  2.0 u/h    0930 2.55          I:C ratio       0000 1:9   11:30 1:8              Sensitivity       0000  15      Goal       0000  110            Type & Model of Pump: Omnipod Insulin Type: Currently using Humalog .  PUMP STATISTICS: Average BG: 241 BG Readings: 2.1/day Average Daily Carbs (g):  Average Total Daily Insulin:  Average Daily Basal:  Average Daily Bolus:       HOME DIABETES REGIMEN:  Ozempic 2 mg weekly  Humalog     CONTINUOUS GLUCOSE MONITORING RECORD INTERPRETATION    Dates of Recording: 6/16-6/29/2023  Sensor description:Dexcom  Results statistics:   CGM use % of time 100  Average and SD 194/46  Time in range  45%  % Time Above 180 40  % Time above 250 15  % Time Below target 0    Glycemic patterns summary: Hyperglycemia is noted most of the day and trends down at night  Hyperglycemic episodes  Post prandial  Hypoglycemic episodes occurred  n/a  Overnight periods: trends down    DIABETIC COMPLICATIONS: Microvascular complications:  Retinopathy (bleeding in the eye )  Denies: CKD, neuropathy  Last eye exam: Completed 08/19/2020   Macrovascular complications:  CAD Denies: PVD, CVA       HISTORY:  Past Medical History:  Past Medical History:  Diagnosis Date   Anxiety    Clotting disorder ([place])    blood clot in eye 2016   Coronary artery disease, non-occlusive    a. 11/2016 NSTEMI/Cath: LM nl, LAD 40p, 44m, D1/2 small, LCX 40ost, OM2/3 nl/small, RCA 35 ost/mid, RPDA/RPL small/nl.   Coronary vasospasm ([facility])    Depression    Diastolic dysfunction    a. 11/2016 Echo: EF 55-60%, gr1 DD, sev calcified MV annulus, mildly dil LA.   Eye hemorrhage 01/2015   "right; resolved" (07/03/2017)   Headache    "monthly"  (07/03/2017)   History of blood transfusion    "when I had an ectopic pregnancy"   History of stomach ulcers    Hyperlipidemia    Hypertension    Microcytic anemia    Moderate mitral stenosis by prior echocardiogram 03/2021   TTE: Mod MS (mean gradient 9 mmHg). -- TEE Moderate-Severe MS (mean gradient 14 mmHg) with fixed Post MV Leaflet & Severe MAC w/ large circumscribed calcified mass on the Post MV Leaflet. (Mass previously described in 2018) - CMRI identifies calcified mass & not Tumor.; R&LHC - LVEDP & PCWP both 23 mmHg indicating minimal resting gradient   OSA on CPAP    setting is unknown   [place] ([place]oxysmal [place]) [place] 03/[place]   Event Monitor Aug-Sept 2022: Predominantly sinus rhythm.  1% A. fib burden.  30 brief episodes of PAT.  2 short bursts of NSVT.   PAT (paroxysmal atrial tachycardia) ([facility]    Event monitor from August 2022 showed 30 brief bursts longest 14 seconds.   Pneumonia    "several times" (07/03/2017)   PVC's (premature ventricular contractions)    Rheumatoid arthritis ([place]    Syncope 07/03/2017 X 2   called seizures but no medications   Thalassemia    "my cells are sickle cell shaped but I don't have sickle cell anemia" (07/03/2017)   Type II diabetes mellitus ([place]    Past Surgical History:  Past Surgical History:  Procedure Laterality Date   48-Hour Monitor  03/18/2017   Sinus rhythm with sinus tachycardia (rate 58-134 BPM)multiple PVCs noted with couplets and bigeminy. One triplet. 7 runs of PAT ranging from 100-130 bpm. Longest was 33 beats.   BIOPSY  04/03/2021   Procedure: BIOPSY;  Surgeon: AYetta Flock MD;  [facility];  Service: Gastroenterology;;   BREAST BIOPSY Left    BRONCHIAL WASHINGS  03/28/2021   Procedure: BRONCHIAL WASHINGS;  Surgeon: CJulian Hy DO;  [facility]  Service: Endoscopy;;   CARDIAC CATHETERIZATION N/A 11/19/2016   Procedure: Left Heart Cath and Coronary Angiography;  Surgeon: HBelva Crome MD;  [facility];  Service: Cardiovascular.    LM nl, LAD 40p, 574m D1/2 small, LCX 40ost, OM2/3 nl/small, RCA 35 ost/mid, RPDA/RPL small/nl.   CARDIAC MRI  03/30/2021   Normal LVEF ~53%. 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 No Scar or Late Gadolinium Enhancement on LV Myocardium.   CARPAL TUNNEL RELEASE Right 11/01/2014   COLONOSCOPY     DILATION AND CURETTAGE OF UTERUS  ECTOPIC PREGNANCY SURGERY  X 2   ESOPHAGOGASTRODUODENOSCOPY (EGD) WITH PROPOFOL N/A 04/03/2021   Procedure: ESOPHAGOGASTRODUODENOSCOPY (EGD) WITH PROPOFOL;  Surgeon: Yetta Flock, MD;  [facility];  Service: Gastroenterology;  Laterality: N/A;   ESOPHAGOGASTRODUODENOSCOPY (EGD) WITH PROPOFOL N/A 08/15/2021   Procedure: ESOPHAGOGASTRODUODENOSCOPY (EGD) WITH PROPOFOL;  Surgeon: Ladene Artist, MD;  Location: [facility];  Service: Endoscopy;  Laterality: N/A;   JOINT REPLACEMENT     KNEE ARTHROSCOPY Left 11/2014   RIGHT/LEFT HEART CATH AND CORONARY ANGIOGRAPHY N/A 08/08/2021   Procedure: RIGHT/LEFT HEART CATH AND CORONARY ANGIOGRAPHY;  Surgeon: Leonie Man, MD;  Location: [place] [facility];  Service: Cardiovascular;Ost LCx 40%. Ost-Prox RCA 35%. Prox-Mid LAD 25%-<25% D2. Mod-Severely Elevated LVEDP. Mild PA HTN -> PA mean 31 mmHg with a PCWP and LVEDP of 23 mmHg   TEE WITHOUT CARDIOVERSION N/A 03/28/2021   Procedure: TRANSESOPHAGEAL ECHOCARDIOGRAM (TEE);  Surgeon: Thayer Headings, MD;  Location[facility]COPY;; LVEF 55-60%. Normal LV Fxn & no RWMA. No LAA thrombus. Gr II Ao Plaque. Large circumscribed calcific mass (1.8 x 1.4 cm) anterior to posterior annulus.  Fixed posterior leaflet with Mod-Severe MS (Mean MVG ~14 mmHg, Peak 20 mmhg).  Mild MR   TONSILLECTOMY     TOTAL KNEE ARTHROPLASTY Right 02/18/2015   Procedure: RIGHT TOTAL KNEE ARTHROPLASTY;  Surgeon: Netta Cedars, MD;  Location: [place];  Service: Orthopedics;   Laterality: Right;   TOTAL KNEE ARTHROPLASTY Left 08/19/2015   Procedure: LEFT TOTAL KNEE ARTHROPLASTY;  Surgeon: Netta Cedars, MD;  Location: [place];  Service: Orthopedics;  Laterality: Left;   TRANSTHORACIC ECHOCARDIOGRAM  11/2016; 07/2017:   a) normal LV size, thickness and function. EF 55-60%. GR 1 DD.No RWMA severe mitral annular calcification, but no mitral stenosis. Mild LA dilation;; b) normal LV size and function.  EF 65-75%.  Unable to assess diastolic function.  Aortic sclerosis with no stenosis.  Moderate mitral stenosis? (Gradient 9 mmHg) -?  Mild-mod left atrial enlargement    TRANSTHORACIC ECHOCARDIOGRAM  01/2019   EF 65 to 70%.  Normal function.  Elevated LVEDP-GR 1 DD.  Normal RV size and function.  Large calcific mass on posterior mitral leaflet mild to moderate mitral stenosis.   TRANSTHORACIC ECHOCARDIOGRAM  03/24/2021   EF 60 to 65%.  LV function with normal wall motion.  Moderate basal septal LVH.  GRII DD & Mild LA dilation.-> elevated LVEDP.  Normal RV function.  Mildly elevated PAP.  Ill-defined density measuring 3.23 x 2.1 cm region of the posterior MV leaflet along with dense MAC.  Moderate MS (mean MVG of 9 mmHg.) Normal AoV & RAP. --> Recommend TEE 2/2 concern for MV SBE.   VAGINAL HYSTERECTOMY     VIDEO BRONCHOSCOPY N/A 03/28/2021   Procedure: VIDEO BRONCHOSCOPY WITHOUT FLUORO;  Surgeon: Julian Hy, DO;  Location: [place] [facility];  Service: Endoscopy;  Laterality: N/A;   Social History:  reports that she quit smoking about 7 years ago. Her smoking use included cigarettes. She has a 11.00 pack-year smoking history. She has never used smokeless tobacco. She reports that she does not currently use alcohol. She reports that she does not use drugs. Family History:  Family History  Problem Relation Age of Onset   Cancer Mother    Diabetes Mother    Hypertension Mother    Hyperlipidemia Mother    Cancer Father    Hyperlipidemia Father    Mental illness Sister     Diabetes Sister    Diabetes Maternal  Grandmother    Diabetes Maternal Grandfather    Colon cancer Neg Hx    Esophageal cancer Neg Hx    Stomach cancer Neg Hx    Rectal cancer Neg Hx    Tremor Neg Hx    Parkinson's disease Neg Hx      HOME MEDICATIONS: Allergies as of 04/12/2022       Reactions   Methotrexate Other (See Comments)   Pneumonitis   Penicillins Hives   Childhood allergy Has patient had a PCN reaction causing immediate rash, facial/tongue/throat swelling, SOB or lightheadedness with hypotension: Yes Has patient had a PCN reaction causing severe rash involving mucus membranes or skin necrosis: No Has patient had a PCN reaction that required hospitalization No Has patient had a PCN reaction occurring within the last 10 years: No If all of the above answers are "NO", then may proceed with Cephalosporin use.   Farxiga [dapagliflozin] Other (See Comments)   Dizzy and lethargic   Metformin And Related Other (See Comments)   Diarrhea, bleeding   Milk [lac Bovis] Diarrhea   Milk and dairy products        Medication List        Accurate as of April 12, 2022  1:37 PM. If you have any questions, ask your nurse or doctor.          acetaminophen 500 MG tablet Commonly known as: TYLENOL Take 500 mg by mouth every 6 (six) hours as needed for fever.   albuterol 108 (90 Base) MCG/ACT inhaler Commonly known as: VENTOLIN HFA Inhale 2 puffs into the lungs every 6 (six) hours as needed for wheezing or shortness of breath.   apixaban 5 MG Tabs tablet Commonly known as: ELIQUIS Take 1 tablet (5 mg total) by mouth 2 (two) times daily.   atorvastatin 80 MG tablet Commonly known as: LIPITOR Take 1 tablet (80 mg total) by mouth every evening.   B-D UF III MINI PEN NEEDLES 31G X 5 MM Misc Generic drug: Insulin Pen Needle USE DAILY WITH VICTOZA AND LANTUS.   Insulin Pen Needle 31G X 5 MM Misc 1 Device by Does not apply route 3 (three) times daily.   cetirizine 10 MG  tablet Commonly known as: ZYRTEC Take 10 mg by mouth daily as needed for allergies.   Contour Next Test test strip Generic drug: glucose blood 3x daily   Dexcom G6 Sensor Misc 1 Device by Does not apply route as directed.   Dexcom G6 Transmitter Misc 1 Device by Does not apply route as directed.   diltiazem 360 MG 24 hr capsule Commonly known as: CARDIZEM CD Take 1 capsule (360 mg total) by mouth daily.   folic acid 1 MG tablet Commonly known as: FOLVITE Take 2 tablets (2 mg total) by mouth daily. What changed:  how much to take when to take this   furosemide 20 MG tablet Commonly known as: Lasix Decrease Lasix ( furosemide)  to 20 mg ( 1/2/tablet)  , with 20 mg  you may take a an additional 20 mg if need for swelling or weight gain more than 3 lbs overnight or 5 lbs in one week -- if you have continue to take an additional 20 mg , go back to 40 mg  Take additional dose as needed for egt gain> 3 lb in 1 day   hydrochlorothiazide 12.5 MG tablet Commonly known as: HYDRODIURIL hydrochlorothiazide 12.5 mg tablet  TAKE 1 TABLET BY MOUTH EVERY DAY   insulin lispro 100  UNIT/ML injection Commonly known as: HUMALOG Max Daily 100 units per pump   Lancets 30G Misc 1 Device by Does not apply route 3 (three) times daily.   leflunomide 20 MG tablet Commonly known as: ARAVA Take 20 mg by mouth daily.   losartan 50 MG tablet Commonly known as: COZAAR Take 1 tablet (50 mg total) by mouth daily.   Melatonin 5 MG Chew Chew 5 mg by mouth at bedtime.   multivitamin with minerals Tabs tablet Take 1 tablet by mouth daily.   Omnipod DASH Pods (Gen 4) Misc CHANGE EVERY 2 DAYS   pantoprazole 40 MG tablet Commonly known as: PROTONIX Take 1 tablet (40 mg total) by mouth 2 (two) times daily before a meal.   SAFETY-LOK TB SYRINGE 27GX.5" 27G X 1/2" 1 ML Misc Generic drug: TUBERCULIN SYR 1CC/27GX1/2"   Semaglutide (2 MG/DOSE) 8 MG/3ML Sopn Inject 2 mg as directed once a week.    traZODone 50 MG tablet Commonly known as: DESYREL TAKE 1/2 TO 1 TABLET BY MOUTH AT BEDTIME AS NEEDED FOR SLEEP   VICKS SINEX NA Place 1 spray into the nose daily as needed (congestion).         OBJECTIVE:   Vital Signs: BP 126/82 (BP Location: Left Arm, Patient Position: Sitting, Cuff Size: Large)   Pulse 88   Wt 246 lb (111.6 kg)   LMP  (LMP Unknown)   SpO2 99%   BMI 43.58 kg/m     Wt Readings from Last 3 Encounters:  04/12/22 246 lb (111.6 kg)  04/09/22 242 lb (109.8 kg)  02/06/22 244 lb (110.7 kg)     Exam: General: Pt appears well on O2 through [place]  Lungs: Clear with good BS bilat   Heart: RRR  Extremities: No pretibial edema  Neuro: MS is good with appropriate affect, pt is alert and Ox3       DM foot exam: 04/12/2022 The skin of the feet is intact without sores or ulcerations. The pedal pulses are 2+ on right and 2+ on left. The sensation is intact to a screening 5.07, 10 gram monofilament bilaterally    DATA REVIEWED:  Lab Results  Component Value Date   HGBA1C 7.6 (A) 04/12/2022   HGBA1C 8.6 (H) 11/14/2021   HGBA1C 9.1 (A) 09/11/2021         Latest Reference Range & Units 04/09/22 12:50  Sodium 135 - 145 mmol/L 140  Potassium 3.5 - 5.1 mmol/L 3.3 (L)  Chloride 98 - 111 mmol/L 98  CO2 22 - 32 mmol/L 31  Glucose 70 - 99 mg/dL 195 (H)  BUN 8 - 23 mg/dL 17  Creatinine 0.44 - 1.00 mg/dL 1.14 (H)  Calcium 8.9 - 10.3 mg/dL 10.0  Anion gap 5 - 15  11  Alkaline Phosphatase 38 - 126 U/L 79  Albumin 3.5 - 5.0 g/dL 4.2  AST 15 - 41 U/L 17  ALT 0 - 44 U/L 16  Total Protein 6.5 - 8.1 g/dL 7.5  Total Bilirubin 0.3 - 1.2 mg/dL 0.3    ASSESSMENT / PLAN / RECOMMENDATIONS:   1) Type 2 Diabetes Mellitus, Sub-Optimally , With retinopathy and macrovascular complications - Most recent A1c of 7.6%. Goal A1c <7.0 %.    -Her A1c has improved but this could be skewed due to recent iron infusions but in review of her CGM download the patient glucose average  194 mg/DL -We again emphasized the importance of bolusing before the meal rather than halfway or at the end  of the meal to prevent hyperglycemia -She is tolerating Ozempic, we may consider switching to [facility] once the shortage of supply have resolved -I will increase her basal rate as below -She will be upgraded to the Morse 5, and new prescription has been sent, a referral has been placed to our CDE for training -I am also going to change her sensitivity factor as below    MEDICATIONS: Continue  Ozempic 2 mg weekly  Humalog per pump   Pump   Omnipod Settings   Insulin type   Humalog    Basal rate       0000  2.3 u/h    0930 2.60          I:C ratio       0000 1:9   11:30 1:8              Sensitivity       0000  10      Goal       0000  110             EDUCATION / INSTRUCTIONS: BG monitoring instructions: Patient is instructed to check her blood sugars 3 times a day, fasting , supper and bedtime. Call [place] [facility] if: BG persistently < 70  I reviewed the Rule of 15 for the treatment of hypoglycemia in detail with the patient. Literature supplied.    F/U in 6 months    Signed electronically by: Mack Guise, MD  [facility] Endocrinology  [facility] [address] [place], [place] [address] Phone: [phone] FAX: [phone]   CC: [name], [address] [place] [address] Phone: [phone]  Fax: [phone]  Return to Endocrinology clinic as below: Future Appointments  Date Time Provider [place]  04/26/2022 11:20 AM [facility] [place] E[facility] 2 [id number]  06/11/2022  3:20 PM Leonie Man, MD [facility]  06/14/2022  1:00 PM [facility]

## 2022-04-12 NOTE — Patient Instructions (Signed)
Continue OZempic 2 mg weekly    Pump    Omni pod Settings   Insulin type    Humalog   Basal rate       0000-09:30 2.30   09:30 - 0000 2.60          I:C ratio       0000- 1:9   2330 1:8              Sensitivity    10            Goal       0000  110

## 2022-04-12 NOTE — Telephone Encounter (Signed)
Per 04/09/22 los - called and lvm of upcoming appointments - requested call back to confirm

## 2022-04-13 ENCOUNTER — Encounter: Payer: Self-pay | Admitting: Internal Medicine

## 2022-04-13 ENCOUNTER — Other Ambulatory Visit: Payer: Self-pay

## 2022-04-13 MED ORDER — OMNIPOD 5 DEXG7G6 INTRO GEN 5 KIT: 1.0000 | PACK | 0 refills | Status: DC

## 2022-04-13 MED ORDER — OMNIPOD 5 DEXG7G6 PODS GEN 5 MISC: 1.0000 | 3 refills | Status: DC

## 2022-04-24 ENCOUNTER — Encounter: Payer: Medicare Other | Attending: Family Medicine | Admitting: Nutrition

## 2022-04-24 DIAGNOSIS — E1165 Type 2 diabetes mellitus with hyperglycemia: Secondary | ICD-10-CM | POA: Diagnosis not present

## 2022-04-24 DIAGNOSIS — Z713 Dietary counseling and surveillance: Secondary | ICD-10-CM | POA: Diagnosis not present

## 2022-04-24 DIAGNOSIS — Z794 Long term (current) use of insulin: Secondary | ICD-10-CM | POA: Diagnosis not present

## 2022-04-24 NOTE — Patient Instructions (Signed)
Read over starter booklet and download manual and read.   Call OmniPod with questions.  Call office if blood sugars drop below 70, or remain over 250.

## 2022-04-24 NOTE — Progress Notes (Signed)
Patient was trained on the use of the OmniPod 5.  Settings were transferred from her Dash:  Basal rate:  MN: 2.3, 9:30AM: 2.6,  ISF: MN: 9,  11:30PM: 8,  ISF: 10, target 110, with correction over 110, transmitter [id number].  PDM was set up with omnipod username and password, but glooko was not set up.  Phone message left with Stanton Kidney to link patient to [facility], and then to [employer].  Patient notified that she will be calling her to do this.  She re demonstrated how to put the pump in Automated mode, and how to bolus. and how and when to do correction doses.  She reported good understanding of this with no final questions.

## 2022-04-25 DIAGNOSIS — Z794 Long term (current) use of insulin: Secondary | ICD-10-CM | POA: Diagnosis not present

## 2022-04-25 DIAGNOSIS — E1165 Type 2 diabetes mellitus with hyperglycemia: Secondary | ICD-10-CM | POA: Diagnosis not present

## 2022-04-26 ENCOUNTER — Ambulatory Visit (HOSPITAL_COMMUNITY): Payer: Medicare Other | Attending: Internal Medicine

## 2022-04-26 DIAGNOSIS — I2510 Atherosclerotic heart disease of native coronary artery without angina pectoris: Secondary | ICD-10-CM | POA: Diagnosis not present

## 2022-04-26 DIAGNOSIS — I050 Rheumatic mitral stenosis: Secondary | ICD-10-CM | POA: Insufficient documentation

## 2022-04-26 DIAGNOSIS — I5032 Chronic diastolic (congestive) heart failure: Secondary | ICD-10-CM | POA: Insufficient documentation

## 2022-04-26 DIAGNOSIS — I480 Paroxysmal atrial fibrillation: Secondary | ICD-10-CM | POA: Diagnosis not present

## 2022-04-26 HISTORY — PX: TRANSTHORACIC ECHOCARDIOGRAM: SHX275

## 2022-04-26 LAB — ECHOCARDIOGRAM COMPLETE
Area-P 1/2: 2.83 cm2
MV VTI: 1.17 cm2
S' Lateral: 1.8 cm

## 2022-05-01 DIAGNOSIS — J704 Drug-induced interstitial lung disorders, unspecified: Secondary | ICD-10-CM | POA: Diagnosis not present

## 2022-05-01 DIAGNOSIS — M0579 Rheumatoid arthritis with rheumatoid factor of multiple sites without organ or systems involvement: Secondary | ICD-10-CM | POA: Diagnosis not present

## 2022-05-01 DIAGNOSIS — M2550 Pain in unspecified joint: Secondary | ICD-10-CM | POA: Diagnosis not present

## 2022-05-01 DIAGNOSIS — R5382 Chronic fatigue, unspecified: Secondary | ICD-10-CM | POA: Diagnosis not present

## 2022-05-14 ENCOUNTER — Other Ambulatory Visit: Payer: Self-pay | Admitting: Family Medicine

## 2022-05-14 DIAGNOSIS — Z1231 Encounter for screening mammogram for malignant neoplasm of breast: Secondary | ICD-10-CM

## 2022-05-15 DIAGNOSIS — E1165 Type 2 diabetes mellitus with hyperglycemia: Secondary | ICD-10-CM | POA: Diagnosis not present

## 2022-05-15 DIAGNOSIS — Z794 Long term (current) use of insulin: Secondary | ICD-10-CM | POA: Diagnosis not present

## 2022-05-26 DIAGNOSIS — E1165 Type 2 diabetes mellitus with hyperglycemia: Secondary | ICD-10-CM | POA: Diagnosis not present

## 2022-05-26 DIAGNOSIS — Z794 Long term (current) use of insulin: Secondary | ICD-10-CM | POA: Diagnosis not present

## 2022-05-30 ENCOUNTER — Other Ambulatory Visit: Payer: Self-pay | Admitting: Cardiology

## 2022-06-07 ENCOUNTER — Encounter: Payer: Self-pay | Admitting: Family

## 2022-06-07 ENCOUNTER — Inpatient Hospital Stay: Payer: Medicare Other | Admitting: Family

## 2022-06-07 ENCOUNTER — Inpatient Hospital Stay: Payer: Medicare Other | Attending: Hematology & Oncology

## 2022-06-07 VITALS — BP 138/68 | HR 76 | Temp 98.7°F | Resp 18 | Wt 240.1 lb

## 2022-06-07 DIAGNOSIS — D563 Thalassemia minor: Secondary | ICD-10-CM | POA: Insufficient documentation

## 2022-06-07 DIAGNOSIS — D500 Iron deficiency anemia secondary to blood loss (chronic): Secondary | ICD-10-CM

## 2022-06-07 DIAGNOSIS — D509 Iron deficiency anemia, unspecified: Secondary | ICD-10-CM | POA: Insufficient documentation

## 2022-06-07 LAB — CBC WITH DIFFERENTIAL (CANCER CENTER ONLY)
Abs Immature Granulocytes: 0.03 10*3/uL (ref 0.00–0.07)
Basophils Absolute: 0.1 10*3/uL (ref 0.0–0.1)
Basophils Relative: 1 %
Eosinophils Absolute: 0.2 10*3/uL (ref 0.0–0.5)
Eosinophils Relative: 3 %
HCT: 30 % — ABNORMAL LOW (ref 36.0–46.0)
Hemoglobin: 8.9 g/dL — ABNORMAL LOW (ref 12.0–15.0)
Immature Granulocytes: 0 %
Lymphocytes Relative: 16 %
Lymphs Abs: 1.2 10*3/uL (ref 0.7–4.0)
MCH: 18.9 pg — ABNORMAL LOW (ref 26.0–34.0)
MCHC: 29.7 g/dL — ABNORMAL LOW (ref 30.0–36.0)
MCV: 63.7 fL — ABNORMAL LOW (ref 80.0–100.0)
Monocytes Absolute: 0.6 10*3/uL (ref 0.1–1.0)
Monocytes Relative: 9 %
Neutro Abs: 5.3 10*3/uL (ref 1.7–7.7)
Neutrophils Relative %: 71 %
Platelet Count: 250 10*3/uL (ref 150–400)
RBC: 4.71 MIL/uL (ref 3.87–5.11)
RDW: 16.5 % — ABNORMAL HIGH (ref 11.5–15.5)
WBC Count: 7.5 10*3/uL (ref 4.0–10.5)
nRBC: 0 % (ref 0.0–0.2)

## 2022-06-07 LAB — FERRITIN: Ferritin: 193 ng/mL (ref 11–307)

## 2022-06-07 LAB — RETICULOCYTES
Immature Retic Fract: 19.6 % — ABNORMAL HIGH (ref 2.3–15.9)
RBC.: 4.65 MIL/uL (ref 3.87–5.11)
Retic Count, Absolute: 87 10*3/uL (ref 19.0–186.0)
Retic Ct Pct: 1.9 % (ref 0.4–3.1)

## 2022-06-07 NOTE — Progress Notes (Signed)
Hematology and Oncology Follow Up Visit  Lynn [name] [id number] 1956/02/10 66 y.o. 06/07/2022   Principle Diagnosis:  Iron deficiency anemia  Beta thalassemia minor   Current Therapy:        IV iron as indicated  Folic acid 2 mg PO daily   Interim History:  Lynn [name] is here today for follow-up. She is doing well but has occasional chest discomfort. She has an appointment with her cardiologist next week on Monday to discuss.  She has had frequent diarrhea which she attributes both Ozempic and hydroxychlorquine. She will try taking Imodium as needed.  No blood loss noted. No bruising or petechiae.  No fever, chills, n/v, cough, rash, dizziness, palpitations, abdominal pain or changes in bladder habits.  No swelling in her extremities.  Neuropathy in her upper and lower extremities wax and wane.  Appetite and hydration are good. Her weight is stable at 240 lbs.   ECOG Performance Status: 1 - Symptomatic but completely ambulatory  Medications:  Allergies as of 06/07/2022       Reactions   Methotrexate Other (See Comments)   Pneumonitis   Penicillins Hives   Childhood allergy Has patient had a PCN reaction causing immediate rash, facial/tongue/throat swelling, SOB or lightheadedness with hypotension: Yes Has patient had a PCN reaction causing severe rash involving mucus membranes or skin necrosis: No Has patient had a PCN reaction that required hospitalization No Has patient had a PCN reaction occurring within the last 10 years: No If all of the above answers are "NO", then may proceed with Cephalosporin use.   Farxiga [dapagliflozin] Other (See Comments)   Dizzy and lethargic   Metformin And Related Other (See Comments)   Diarrhea, bleeding   Milk [milk (cow)] Diarrhea   Milk and dairy products        Medication List        Accurate as of June 07, 2022  2:39 PM. If you have any questions, ask your nurse or doctor.          acetaminophen 500 MG  tablet Commonly known as: TYLENOL Take 500 mg by mouth every 6 (six) hours as needed for fever.   albuterol 108 (90 Base) MCG/ACT inhaler Commonly known as: VENTOLIN HFA Inhale 2 puffs into the lungs every 6 (six) hours as needed for wheezing or shortness of breath.   apixaban 5 MG Tabs tablet Commonly known as: ELIQUIS Take 1 tablet (5 mg total) by mouth 2 (two) times daily.   atorvastatin 80 MG tablet Commonly known as: LIPITOR Take 1 tablet (80 mg total) by mouth every evening.   B-D UF III MINI PEN NEEDLES 31G X 5 MM Misc Generic drug: Insulin Pen Needle USE DAILY WITH VICTOZA AND LANTUS.   Insulin Pen Needle 31G X 5 MM Misc 1 Device by Does not apply route 3 (three) times daily.   cetirizine 10 MG tablet Commonly known as: ZYRTEC Take 10 mg by mouth daily as needed for allergies.   Contour Next Test test strip Generic drug: glucose blood 3x daily   Dexcom G6 Sensor Misc 1 Device by Does not apply route as directed.   Dexcom G6 Transmitter Misc 1 Device by Does not apply route as directed.   diltiazem 360 MG 24 hr capsule Commonly known as: CARDIZEM CD TAKE 1 CAPSULE BY MOUTH EVERY DAY   folic acid 1 MG tablet Commonly known as: FOLVITE Take 2 tablets (2 mg total) by mouth daily. What changed:  how much to  take when to take this   furosemide 20 MG tablet Commonly known as: Lasix Decrease Lasix ( furosemide)  to 20 mg ( 1/2/tablet)  , with 20 mg  you may take a an additional 20 mg if need for swelling or weight gain more than 3 lbs overnight or 5 lbs in one week -- if you have continue to take an additional 20 mg , go back to 40 mg  Take additional dose as needed for egt gain> 3 lb in 1 day   hydrochlorothiazide 12.5 MG tablet Commonly known as: HYDRODIURIL hydrochlorothiazide 12.5 mg tablet  TAKE 1 TABLET BY MOUTH EVERY DAY   insulin lispro 100 UNIT/ML injection Commonly known as: HUMALOG Max Daily 100 units per pump   Lancets 30G Misc 1 Device by  Does not apply route 3 (three) times daily.   leflunomide 20 MG tablet Commonly known as: ARAVA Take 20 mg by mouth daily.   losartan 50 MG tablet Commonly known as: COZAAR TAKE 1 TABLET BY MOUTH EVERY DAY   Melatonin 5 MG Chew Chew 5 mg by mouth at bedtime.   multivitamin with minerals Tabs tablet Take 1 tablet by mouth daily.   Omnipod 5 G6 Pod (Gen 5) Misc 1 Device by Does not apply route every other day.   Omnipod 5 G6 Intro (Gen 5) Kit 1 Device by Does not apply route every other day.   pantoprazole 40 MG tablet Commonly known as: PROTONIX Take 1 tablet (40 mg total) by mouth 2 (two) times daily before a meal.   SAFETY-LOK TB SYRINGE 27GX.5" 27G X 1/2" 1 ML Misc Generic drug: TUBERCULIN SYR 1CC/27GX1/2"   Semaglutide (2 MG/DOSE) 8 MG/3ML Sopn Inject 2 mg as directed once a week.   traZODone 50 MG tablet Commonly known as: DESYREL TAKE 1/2 TO 1 TABLET BY MOUTH AT BEDTIME AS NEEDED FOR SLEEP   VICKS SINEX NA Place 1 spray into the nose daily as needed (congestion).        Allergies:  Allergies  Allergen Reactions   Methotrexate Other (See Comments)    Pneumonitis   Penicillins Hives    Childhood allergy Has patient had a PCN reaction causing immediate rash, facial/tongue/throat swelling, SOB or lightheadedness with hypotension: Yes Has patient had a PCN reaction causing severe rash involving mucus membranes or skin necrosis: No Has patient had a PCN reaction that required hospitalization No Has patient had a PCN reaction occurring within the last 10 years: No If all of the above answers are "NO", then may proceed with Cephalosporin use.   [name] [Dapagliflozin] Other (See Comments)    Dizzy and lethargic    Metformin And Related Other (See Comments)    Diarrhea, bleeding   Milk [Milk (Cow)] Diarrhea    Milk and dairy products    Past Medical History, Surgical history, Social history, and Family History were reviewed and updated.  Review of  Systems: All other 10 point review of systems is negative.   Physical Exam:  vitals were not taken for this visit.   Wt Readings from Last 3 Encounters:  04/12/22 246 lb (111.6 kg)  04/09/22 242 lb (109.8 kg)  02/06/22 244 lb (110.7 kg)    Ocular: Sclerae unicteric, pupils equal, round and reactive to light Ear-nose-throat: Oropharynx clear, dentition fair Lymphatic: No cervical or supraclavicular adenopathy Lungs no rales or rhonchi, good excursion bilaterally Heart regular rate and rhythm, no murmur appreciated Abd soft, nontender, positive bowel sounds MSK no focal spinal tenderness, no  joint edema Neuro: non-focal, well-oriented, appropriate affect Breasts: Deferred   Lab Results  Component Value Date   WBC 7.5 06/07/2022   HGB 8.9 (L) 06/07/2022   HCT 30.0 (L) 06/07/2022   MCV 63.7 (L) 06/07/2022   PLT 250 06/07/2022   Lab Results  Component Value Date   FERRITIN 478 (H) 04/09/2022   IRON 79 04/09/2022   TIBC 281 04/09/2022   UIBC 202 04/09/2022   IRONPCTSAT 28 04/09/2022   Lab Results  Component Value Date   RETICCTPCT 1.9 06/07/2022   RBC 4.65 06/07/2022   Lab Results  Component Value Date   KAPLAMBRATIO 4.04 03/31/2021   No results found for: "IGGSERUM", "IGA", "IGMSERUM" Lab Results  Component Value Date   TOTALPROTELP 7.5 03/30/2021   ALBUMINELP 3.3 03/30/2021   A1GS 0.3 03/30/2021   A2GS 1.1 (H) 03/30/2021   BETS 0.9 03/30/2021   GAMS 1.9 (H) 03/30/2021   MSPIKE 0.8 (H) 03/30/2021   SPEI Comment 03/30/2021     Chemistry      Component Value Date/Time   NA 140 04/09/2022 1250   NA 139 10/24/2021 0000   K 3.3 (L) 04/09/2022 1250   CL 98 04/09/2022 1250   CO2 31 04/09/2022 1250   BUN 17 04/09/2022 1250   BUN 15 10/24/2021 0000   CREATININE 1.14 (H) 04/09/2022 1250   CREATININE 0.95 01/03/2016 1118   GLU 105 10/24/2021 0000      Component Value Date/Time   CALCIUM 10.0 04/09/2022 1250   ALKPHOS 79 04/09/2022 1250   AST 17 04/09/2022  1250   ALT 16 04/09/2022 1250   BILITOT 0.3 04/09/2022 1250       Impression and Plan: Ms. Baum is a very pleasant 66 yo African American female with long history of microcytic anemia and beta thalassemia minor.  Iron studies are pending.  Continue daily folic acid.  Follow-up in 3 months.   Lottie Dawson, NP 8/24/20232:39 PM

## 2022-06-08 ENCOUNTER — Ambulatory Visit: Payer: Medicare Other | Admitting: Family

## 2022-06-08 ENCOUNTER — Other Ambulatory Visit: Payer: Medicare Other

## 2022-06-08 LAB — IRON AND IRON BINDING CAPACITY (CC-WL,HP ONLY)
Iron: 41 ug/dL (ref 28–170)
Saturation Ratios: 14 % (ref 10.4–31.8)
TIBC: 291 ug/dL (ref 250–450)
UIBC: 250 ug/dL (ref 148–442)

## 2022-06-09 ENCOUNTER — Other Ambulatory Visit: Payer: Self-pay | Admitting: Family Medicine

## 2022-06-09 DIAGNOSIS — I214 Non-ST elevation (NSTEMI) myocardial infarction: Secondary | ICD-10-CM

## 2022-06-09 DIAGNOSIS — E1169 Type 2 diabetes mellitus with other specified complication: Secondary | ICD-10-CM

## 2022-06-11 ENCOUNTER — Ambulatory Visit: Payer: Medicare Other | Attending: Cardiology | Admitting: Cardiology

## 2022-06-11 ENCOUNTER — Encounter: Payer: Self-pay | Admitting: Cardiology

## 2022-06-11 VITALS — BP 132/72 | HR 77 | Ht 64.0 in | Wt 239.0 lb

## 2022-06-11 DIAGNOSIS — I480 Paroxysmal atrial fibrillation: Secondary | ICD-10-CM

## 2022-06-11 DIAGNOSIS — R0609 Other forms of dyspnea: Secondary | ICD-10-CM | POA: Diagnosis not present

## 2022-06-11 DIAGNOSIS — I2510 Atherosclerotic heart disease of native coronary artery without angina pectoris: Secondary | ICD-10-CM | POA: Diagnosis not present

## 2022-06-11 DIAGNOSIS — J849 Interstitial pulmonary disease, unspecified: Secondary | ICD-10-CM | POA: Diagnosis not present

## 2022-06-11 DIAGNOSIS — I10 Essential (primary) hypertension: Secondary | ICD-10-CM | POA: Diagnosis not present

## 2022-06-11 DIAGNOSIS — I050 Rheumatic mitral stenosis: Secondary | ICD-10-CM | POA: Diagnosis not present

## 2022-06-11 DIAGNOSIS — E1169 Type 2 diabetes mellitus with other specified complication: Secondary | ICD-10-CM

## 2022-06-11 DIAGNOSIS — I471 Supraventricular tachycardia: Secondary | ICD-10-CM | POA: Diagnosis not present

## 2022-06-11 DIAGNOSIS — E785 Hyperlipidemia, unspecified: Secondary | ICD-10-CM | POA: Diagnosis not present

## 2022-06-11 DIAGNOSIS — R55 Syncope and collapse: Secondary | ICD-10-CM | POA: Diagnosis not present

## 2022-06-11 NOTE — Patient Instructions (Signed)
Medication Instructions:   No changes   *If you need a refill on your cardiac medications before your next appointment, please call your pharmacy*   Lab Work:  Not needed    Testing/Procedures:  Will be schedule in 6 months  Feb 2024 Your physician has requested that you have an echocardiogram. Echocardiography is a painless test that uses sound waves to create images of your heart. It provides your doctor with information about the size and shape of your heart and how well your heart's chambers and valves are working. This procedure takes approximately one hour. There are no restrictions for this procedure.    Follow-Up: At [facility], you and your health needs are our priority.  As part of our continuing mission to provide you with exceptional heart care, we have created designated Provider Care Teams.  These Care Teams include your primary Cardiologist (physician) and Advanced Practice Providers (APPs -  Physician Assistants and Nurse Practitioners) who all work together to provide you with the care you need, when you need it.  We recommend signing up for the patient portal called "MyChart".  Sign up information is provided on this After Visit Summary.  MyChart is used to connect with patients for Virtual Visits (Telemedicine).  Patients are able to view lab/test results, encounter notes, upcoming appointments, etc.  Non-urgent messages can be sent to your provider as well.   To learn more about what you can do with MyChart, go to [email or web address].    Your next appointment:   6 month(s)after echo   The format for your next appointment:   In Person  Provider:   Glenetta Hew, MD    Other Instructions

## 2022-06-11 NOTE — Progress Notes (Signed)
Primary Care Provider: [name], MD Cardiologist: Glenetta Hew, MD Electrophysiologist: None  Clinic Note: Chief Complaint  Patient presents with   Follow-up    Doing fairly well.  Activity limited by back pain but stable.  Not planning for surgery.  Some exertional dyspnea.  Still having some chest pain.   Mitral Stenosis    Echo results reviewed   ===================================  ASSESSMENT/PLAN   Problem List Items Addressed This Visit       Cardiology Problems   Coronary artery disease, non-occlusive (Chronic)    No significant CAD noted on cath.  Presumably some microvascular disease with some spasm.  Continues to be on high-dose atorvastatin.  Is also on diltiazem which will help for spasm.  No aspirin because of Eliquis.      Relevant Medications   losartan (COZAAR) 100 MG tablet   Essential hypertension (Chronic)   Relevant Medications   losartan (COZAAR) 100 MG tablet   PAF (paroxysmal atrial fibrillation) (HCC); CHA2DS2-VASc score 7 (Chronic)    Overall low burden.  Reassuring due to how symptomatic she was while in A-fib.  If she were to have significant recurrence, would probably need to consider antiarrhythmic agent.  For now we will hold off her rhythm agent unless episodes are more frequent.  Continue diltiazem 306 mg daily for rate control and Eliquis for DOAC.  No bleeding issues.      Relevant Medications   losartan (COZAAR) 100 MG tablet   Other Relevant Orders   EKG 12-Lead (Completed)   ECHOCARDIOGRAM COMPLETE   PAT (paroxysmal atrial tachycardia) (HCC) (Chronic)    Complicating factor and that she has both PAT and low burden of PAF.  Unfortunately she does still have PAF.  Remains on diltiazem 360 mg daily.  Seems to be stable.  Monitor reviewed.  Short bursts but nothing significant.      Relevant Medications   losartan (COZAAR) 100 MG tablet   Hyperlipidemia associated with type 2 diabetes mellitus (HCC) (Chronic)    I  have not seen labs checked in over a year.  She should be due for lipid panel checked soon.  If not checked by PCP by next visit, we can check in follow-up visit.  In 6 months.      Relevant Medications   losartan (COZAAR) 100 MG tablet   Moderate mitral stenosis by prior echocardiography - Primary (Chronic)    Pretty much stable Moderate MS on most recent echo.  She would be a very difficult surgical option based on the amount of calcification on leaflets.  She has had a cardiac MRI and TEE to assess this mass which is basically a calcified nodule.  We will continue to monitor relatively frequently over the next several years to ensure there is no progression of disease.  Due for follow-up in February 2024      Relevant Medications   losartan (COZAAR) 100 MG tablet   Other Relevant Orders   EKG 12-Lead (Completed)   ECHOCARDIOGRAM COMPLETE   Near syncope (Chronic)    No recurrent issues.  She would definitely not do well where she did get overly dehydrated.  I agree with low-dose of diuresis at baseline and using additional dose as necessary based on weight      Relevant Medications   losartan (COZAAR) 100 MG tablet     Other   DOE (dyspnea on exertion)    Probably multifactorial between her underlying lung disease, some diastolic heart function from mitral stenosis  and also obesity.  She is quite deconditioned.  All are contributing.  Needs to continue to gradually build up her exercise level.  Coronary arteries are relatively stable with minimal disease.  Major cardiac constraint of the mitral valve.  Needs to be small with exercise, and stop if this makes the point of incapacitating dyspnea.      ILD (interstitial lung disease) ([place]) (Chronic)    Continue follow-up with pulmonary medicine.      Morbid obesity due to excess calories (HCC) (Chronic)    Continue working on weight loss.  Discussed importance of dietary modification and trying to increase exercise.        ===================================  HPI:    Sharnita Bogucki is a 66 y.o. female with a PMH notable for Chronic Respiratory Failure on Home O2 (presumably due to methotrexate related Pulmonary Fibrosis), PAF, DM-2 (insulin), HTN with relatively normal macrovascular coronaries, but Potential Microvascular Disease, moderate to severe MR with severe MAC-large calcified nodule on the mitral valve), and concern for Chronic HFpEF who presents today for roughly 43-monthfollow-up..  I last saw PShoshanaon January 13.  She was having some dizziness so we stopped her HCTZ and continued low-dose Lasix.  PSaddie Sandeenwas last seen on 12/26/2021 by AFabian Sharp PA after [facility] with syncopal episode thought to be vasovagal with prodromal symptoms due to dehydration from diuretic use. => Unfortunately, she had been taking 40 as opposed to 20 mg Lasix.  Discharged on 20.  Cardiac monitor was placed showing no sustained arrhythmias, simply 43 atrial runs.  She noted dizziness with moving around without oxygen. Advised daily morning weights.  Dry weight felt to be 243 pounds in the morning.  At this time she was taking 1/2 tablet (10 mg) of the 20 mg Lasix, and only using the full dose for weight gain more than 3 pounds or 5 pounds in a week. There is concern for needing back surgery.  I was asked to review echo.  Recent Hospitalizations: None since last visit  Reviewed  CV studies:    The following studies were reviewed today: (if available, images/films reviewed: From Epic Chart or Care Everywhere) Echo 04/26/2022: EF 60 to 65%.  No RWMA.  Moderate LVH with GR 1 DD.  Normal RV size and function.  Mild LA dilation.  Large calcific mass of posterior MV leaflet.  Mean PG 13 mm 3.  Trivial MR.  Mild to moderate MS.  Severe MAC.  Normal AOV.  Normal RAP.  Interval History:   PFrimy Uffelmanreturns for follow-up overall pretty stable.  She still  have lots of left-sided chest discomfort off-and-on with or without exertion, but has had a negative coronary STEMI evaluation with an normal cath.  The pain seems to be with or without exertion and not necessarily classic for angina.  It appears that he is she is not going to be having back surgery. She is somewhat deconditioned and does have some exertional dyspnea but not really exertional chest pain that is recurrent constant.  Chest pain may be at rest or with exertion. She has not noted any symptoms to suggest recurrence of A-fib.  No rapid irregular heartbeats palpitations.  She remains on diltiazem, and Eliquis for DOAC.   CV Review of Symptoms (Summary): positive for - chest pain, dyspnea on exertion, and this is reviewed above negative for - edema, irregular heartbeat, orthopnea, palpitations, paroxysmal nocturnal dyspnea, rapid heart rate, shortness  of breath, or syncope/near syncope or TIA/amaurosis fugax, claudication  REVIEWED OF SYSTEMS   Review of Systems  Constitutional:  Positive for malaise/fatigue (But doing okay). Negative for weight loss.  HENT:  Negative for nosebleeds.   Respiratory:  Positive for shortness of breath (Pretty deconditioned.). Negative for cough.   Cardiovascular:        Reviewed above  Gastrointestinal:  Negative for blood in stool and melena.  Genitourinary:  Negative for hematuria.  Musculoskeletal:  Positive for back pain and joint pain.  Neurological:  Positive for dizziness. Negative for loss of consciousness and weakness.  Psychiatric/Behavioral:  Negative for depression and memory loss. The patient is not nervous/anxious and does not have insomnia.     I have reviewed and (if needed) personally updated the patient's problem list, medications, allergies, past medical and surgical history, social and family history.   PAST MEDICAL HISTORY   Past Medical History:  Diagnosis Date   Anxiety    Clotting disorder ([place])    blood clot in eye 2016    Coronary artery disease, non-occlusive    a. 11/2016 NSTEMI/Cath: LM nl, LAD 40p, 77m, D1/2 small, LCX 40ost, OM2/3 nl/small, RCA 35 ost/mid, RPDA/RPL small/nl.   Coronary vasospasm (HCC)    Depression    Diastolic dysfunction    a. 11/2016 Echo: EF 55-60%, gr1 DD, sev calcified MV annulus, mildly dil LA.   Eye hemorrhage 01/2015   "right; resolved" (07/03/2017)   Headache    "monthly" (07/03/2017)   History of blood transfusion    "when I had an ectopic pregnancy"   History of stomach ulcers    Hyperlipidemia    Hypertension    Microcytic anemia    Moderate mitral stenosis by prior echocardiogram 03/2021   TTE: Mod MS (mean gradient 9 mmHg). -- TEE Moderate-Severe MS (mean gradient 14 mmHg) with fixed Post MV Leaflet & Severe MAC w/ large circumscribed calcified mass on the Post MV Leaflet. (Mass previously described in 2018) - CMRI identifies calcified mass & not Tumor.; R&LHC - LVEDP & PCWP both 23 mmHg indicating minimal resting gradient   OSA on CPAP    setting is unknown   PAF (paroxysmal atrial fibrillation) ([place] 03/2021   Event Monitor Aug-Sept 2022: Predominantly sinus rhythm.  1% A. fib burden.  30 brief episodes of PAT.  2 short bursts of NSVT.   PAT ([place]mal [place] tachycardia) [place]    Event monitor from August 2022 showed 30 brief bursts longest 14 seconds.   Pneumonia    "several times" (07/03/2017)   PVC's (premature ventricular contractions)    Rheumatoid arthritis ([place]    Syncope 07/03/2017 X 2   called seizures but no medications   Thalassemia    "my cells are sickle cell shaped but I don't have sickle cell anemia" (07/03/2017)   Type II diabetes mellitus ([place]     PAST SURGICAL HISTORY   Past Surgical History:  Procedure Laterality Date   48-Hour Monitor  03/18/2017   Sinus rhythm with sinus tachycardia (rate 58-134 BPM)multiple PVCs noted with couplets and bigeminy. One triplet. 7 runs of PAT ranging from 100-130 bpm. Longest was 33 beats.   BIOPSY   04/03/2021   Procedure: BIOPSY;  Surgeon: AYetta Flock MD;  [facility];  Service: Gastroenterology;;   BREAST BIOPSY Left    BRONCHIAL WASHINGS  03/28/2021   Procedure: BRONCHIAL WASHINGS;  Surgeon: CJulian Hy DO;  [facility] [facility]  Service: Endoscopy;;   CARDIAC CATHETERIZATION N/A 11/19/2016  Procedure: Left Heart Cath and Coronary Angiography;  Surgeon: Belva Crome, MD;  Location: [facility];  Service: Cardiovascular.    LM nl, LAD 40p, 79m, D1/2 small, LCX 40ost, OM2/3 nl/small, RCA 35 ost/mid, RPDA/RPL small/nl.   CARDIAC MRI  03/30/2021   Normal LVEF ~53%. 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 No Scar or Late Gadolinium Enhancement on LV Myocardium.   CARPAL TUNNEL RELEASE Right 11/01/2014   COLONOSCOPY     DILATION AND CURETTAGE OF UTERUS     ECTOPIC PREGNANCY SURGERY  X 2   ESOPHAGOGASTRODUODENOSCOPY (EGD) WITH PROPOFOL N/A 04/03/2021   Procedure: ESOPHAGOGASTRODUODENOSCOPY (EGD) WITH PROPOFOL;  Surgeon: AYetta Flock MD;  [facility];  Service: Gastroenterology;  Laterality: N/A;   ESOPHAGOGASTRODUODENOSCOPY (EGD) WITH PROPOFOL N/A 08/15/2021   Procedure: ESOPHAGOGASTRODUODENOSCOPY (EGD) WITH PROPOFOL;  Surgeon: SLadene Artist MD;  Location: [facility];  Service: Endoscopy;  Laterality: N/A;   JOINT REPLACEMENT     KNEE ARTHROSCOPY Left 11/2014   RIGHT/LEFT HEART CATH AND CORONARY ANGIOGRAPHY N/A 08/08/2021   Procedure: RIGHT/LEFT HEART CATH AND CORONARY ANGIOGRAPHY;  Surgeon: HLeonie Man MD;  [facility];  Service: Cardiovascular;Ost LCx 40%. Ost-Prox RCA 35%. Prox-Mid LAD 25%-<25% D2. Mod-Severely Elevated LVEDP. Mild PA HTN -> PA mean 31 mmHg with a PCWP and LVEDP of 23 mmHg   TEE WITHOUT CARDIOVERSION N/A 03/28/2021   Procedure: TRANSESOPHAGEAL ECHOCARDIOGRAM (TEE);  Surgeon: NThayer Headings MD;  [facility];; LVEF 55-60%. Normal LV  Fxn & no RWMA. No LAA thrombus. Gr II Ao Plaque. Large circumscribed calcific mass (1.8 x 1.4 cm) anterior to posterior annulus.  Fixed posterior leaflet with Mod-Severe MS (Mean MVG ~14 mmHg, Peak 20 mmhg).  Mild MR   TONSILLECTOMY     TOTAL KNEE ARTHROPLASTY Right 02/18/2015   Procedure: RIGHT TOTAL KNEE ARTHROPLASTY;  Surgeon: SNetta Cedars MD;  [place] [facility]  Service: Orthopedics;  Laterality: Right;   TOTAL KNEE ARTHROPLASTY Left 08/19/2015   Procedure: LEFT TOTAL KNEE ARTHROPLASTY;  Surgeon: SNetta Cedars MD;  [place] [facility]  Service: Orthopedics;  Laterality: Left;   TRANSTHORACIC ECHOCARDIOGRAM  11/2016; 07/2017:   a) normal LV size, thickness and function. EF 55-60%. GR 1 DD.No RWMA severe mitral annular calcification, but no mitral stenosis. Mild LA dilation;; b) normal LV size and function.  EF 65-75%.  Unable to assess diastolic function.  Aortic sclerosis with no stenosis.  Moderate mitral stenosis? (Gradient 9 mmHg) -?  Mild-mod left atrial enlargement    TRANSTHORACIC ECHOCARDIOGRAM  01/2019   EF 65 to 70%.  Normal function.  Elevated LVEDP-GR 1 DD.  Normal RV size and function.  Large calcific mass on posterior mitral leaflet mild to moderate mitral stenosis.   TRANSTHORACIC ECHOCARDIOGRAM  03/24/2021   EF 60 to 65%.  LV function with normal wall motion.  Moderate basal septal LVH.  GRII DD & Mild LA dilation.-> elevated LVEDP.  Normal RV function.  Mildly elevated PAP.  Ill-defined density measuring 3.23 x 2.1 cm region of the posterior MV leaflet along with dense MAC.  Moderate MS (mean MVG of 9 mmHg.) Normal AoV & RAP. --> Recommend TEE 2/2 concern for MV SBE.   TRANSTHORACIC ECHOCARDIOGRAM  04/26/2022   EF 60 to 65%.  No RWMA.  Moderate LVH with GR 1 DD.  Normal RV size and function.  Mild LA dilation.  Large calcific mass of posterior MV leaflet.  Mean PG 13  mm 3.  Trivial MR.  Mild to moderate MS.  Severe MAC.  Normal AOV.  Normal RAP.   VAGINAL HYSTERECTOMY     VIDEO  BRONCHOSCOPY N/A 03/28/2021   Procedure: VIDEO BRONCHOSCOPY WITHOUT FLUORO;  Surgeon: Julian Hy, DO;  [facility];  Service: Endoscopy;  Laterality: N/A;    Immunization History  Administered Date(s) Administered   Fluad Quad(high Dose 65+) 07/19/2021   Influenza Split 07/22/2012   Influenza, High Dose Seasonal PF 06/10/2019   Influenza,inj,Quad PF,6+ Mos 10/09/2014, 07/13/2015, 11/17/2016, 07/04/2017, 07/10/2018   Influenza-Unspecified 06/20/2020   Moderna Sars-Covid-2 Vaccination 11/27/2019, 12/26/2019, 06/22/2020, 12/30/2020, 06/21/2021   Pneumococcal Conjugate-13 03/23/2019   Pneumococcal Polysaccharide-23 08/22/2020   Pneumococcal-Unspecified 02/12/2010   Tdap 02/12/2010, 10/17/2016   Zoster Recombinat (Shingrix) 02/20/2021    MEDICATIONS/ALLERGIES   Current Meds  Medication Sig   acetaminophen (TYLENOL) 500 MG tablet Take 500 mg by mouth every 6 (six) hours as needed for fever.   albuterol (VENTOLIN HFA) 108 (90 Base) MCG/ACT inhaler Inhale 2 puffs into the lungs every 6 (six) hours as needed for wheezing or shortness of breath.   apixaban (ELIQUIS) 5 MG TABS tablet Take 1 tablet (5 mg total) by mouth 2 (two) times daily.   atorvastatin (LIPITOR) 80 MG tablet TAKE 1 TABLET BY MOUTH EVERY EVENING   cetirizine (ZYRTEC) 10 MG tablet Take 10 mg by mouth daily as needed for allergies.   Continuous Blood Gluc Sensor (DEXCOM G6 SENSOR) MISC 1 Device by Does not apply route as directed.   Continuous Blood Gluc Transmit (DEXCOM G6 TRANSMITTER) MISC 1 Device by Does not apply route as directed.   diltiazem (CARDIZEM CD) 360 MG 24 hr capsule TAKE 1 CAPSULE BY MOUTH EVERY DAY   folic acid (FOLVITE) 1 MG tablet Take 2 tablets (2 mg total) by mouth daily. (Patient taking differently: Take 1 mg by mouth in the morning and at bedtime.)   furosemide (LASIX) 20 MG tablet Decrease Lasix ( furosemide)  to 20 mg ( 1/2/tablet)  , with 20 mg  you may take a an additional 20 mg if need  for swelling or weight gain more than 3 lbs overnight or 5 lbs in one week -- if you have continue to take an additional 20 mg , go back to 40 mg  Take additional dose as needed for egt gain> 3 lb in 1 day   glucose blood (CONTOUR NEXT TEST) test strip 3x daily   hydroxychloroquine (PLAQUENIL) 200 MG tablet Take 200 mg by mouth 2 (two) times daily.   Insulin Disposable Pump (OMNIPOD 5 G6 INTRO, GEN 5,) KIT 1 Device by Does not apply route every other day.   Insulin Disposable Pump (OMNIPOD 5 G6 POD, GEN 5,) MISC 1 Device by Does not apply route every other day.   insulin lispro (HUMALOG) 100 UNIT/ML injection Max Daily 100 units per pump   Insulin Pen Needle (B-D UF III MINI PEN NEEDLES) 31G X 5 MM MISC USE DAILY WITH VICTOZA AND LANTUS. (Patient taking differently: Use for ozempic)   Insulin Pen Needle 31G X 5 MM MISC 1 Device by Does not apply route 3 (three) times daily.   Lancets 30G MISC 1 Device by Does not apply route 3 (three) times daily.   leflunomide (ARAVA) 20 MG tablet Take 20 mg by mouth daily.   losartan (COZAAR) 100 MG tablet 1 tablet Orally Once a day   Melatonin 5 MG CHEW Chew 5 mg by mouth at bedtime.  Multiple Vitamin (MULTIVITAMIN WITH MINERALS) TABS tablet Take 1 tablet by mouth daily.   Oxymetazoline HCl (VICKS SINEX NA) Place 1 spray into the nose daily as needed (congestion).   SAFETY-LOK TB SYRINGE 27GX.5" 27G X 1/2" 1 ML MISC    Semaglutide, 2 MG/DOSE, 8 MG/3ML SOPN Inject 2 mg as directed once a week.   traZODone (DESYREL) 50 MG tablet TAKE 1/2 TO 1 TABLET BY MOUTH AT BEDTIME AS NEEDED FOR SLEEP   [DISCONTINUED] losartan (COZAAR) 50 MG tablet TAKE 1 TABLET BY MOUTH EVERY DAY   [DISCONTINUED] pantoprazole (PROTONIX) 40 MG tablet Take 1 tablet (40 mg total) by mouth 2 (two) times daily before a meal.   Coronary angiography as part of Right and Left Heart Cath October 2022:      Allergies  Allergen Reactions   Methotrexate Other (See Comments)    Pneumonitis    Penicillins Hives    Childhood allergy Has patient had a PCN reaction causing immediate rash, facial/tongue/throat swelling, SOB or lightheadedness with hypotension: Yes Has patient had a PCN reaction causing severe rash involving mucus membranes or skin necrosis: No Has patient had a PCN reaction that required hospitalization No Has patient had a PCN reaction occurring within the last 10 years: No If all of the above answers are "NO", then may proceed with Cephalosporin use.   [name] [Dapagliflozin] Other (See Comments)    Dizzy and lethargic    Metformin And Related Other (See Comments)    Diarrhea, bleeding   Milk [Milk (Cow)] Diarrhea    Milk and dairy products    SOCIAL HISTORY/FAMILY HISTORY   Reviewed in Epic:  Pertinent findings:  Social History   Tobacco Use   Smoking status: Former    Packs/day: 0.25    Years: 44.00    Total pack years: 11.00    Types: Cigarettes    Quit date: 02/17/2015    Years since quitting: 7.3   Smokeless tobacco: Never  Vaping Use   Vaping Use: Never used  Substance Use Topics   Alcohol use: Not Currently    Comment: 07/03/2017 "might have a few drinks/year"   Drug use: No   Social History   Social History Narrative   Lives at home with husband   Right handed   Caffeine: 1 cup coffee in AM     OBJCTIVE -PE, EKG, labs   Wt Readings from Last 3 Encounters:  06/14/22 240 lb 9.6 oz (109.1 kg)  06/11/22 239 lb (108.4 kg)  06/07/22 240 lb 1.9 oz (108.9 kg)    Physical Exam: BP 132/72   Pulse 77   Ht _0  (1.626 m)   Wt 239 lb (108.4 kg)   LMP  (LMP Unknown)   SpO2 96%   BMI 41.02 kg/m  Physical Exam Vitals reviewed.  Constitutional:      General: She is not in acute distress.    Appearance: Normal appearance. She is obese. She is not ill-appearing or toxic-appearing.     Comments: Morbidly obese but otherwise well-groomed.  HENT:     Head: Normocephalic and atraumatic.  Neck:     Vascular: No carotid bruit or JVD.   Cardiovascular:     Rate and Rhythm: Normal rate and regular rhythm. Occasional Extrasystoles are present.    Pulses: Intact distal pulses. Decreased pulses (Decreased, but palpable.).     Heart sounds: S1 normal and S2 normal. Heart sounds are distant. Murmur heard.     Low-pitched rumbling crescendo presystolic murmur is  present with a grade of 1/4 at the apex radiating to the apex.     No friction rub. No gallop.  Pulmonary:     Effort: Pulmonary effort is normal. No respiratory distress.     Comments: Basal accessory muscle use, but nonlabored.  Mild interstitial sounds but no wheezes rales or rhonchi. Musculoskeletal:        General: Swelling (Trace-1+ bilateral) present. Normal range of motion.     Cervical back: Normal range of motion and neck supple.  Skin:    General: Skin is warm and dry.  Neurological:     General: No focal deficit present.     Mental Status: She is alert and oriented to person, place, and time.     Motor: Weakness present.     Gait: Gait normal.  Psychiatric:        Mood and Affect: Mood normal.        Behavior: Behavior normal.        Thought Content: Thought content normal.        Judgment: Judgment normal.     Adult ECG Report  Rate: 77 ;  Rhythm: normal sinus rhythm, premature atrial contractions (PAC), and 1  AVB ; low voltage.  Cannot rule out anterior MI, age-indeterminate.  Narrative Interpretation: Stable  Recent Labs: Reviewed.  Lipids not available. Lab Results  Component Value Date   CHOL 97 08/22/2020   HDL 38.80 (L) 08/22/2020   LDLCALC 44 08/22/2020   LDLDIRECT 131 (H) 09/02/2013   TRIG 74.0 08/22/2020   CHOLHDL 3 08/22/2020   Lab Results  Component Value Date   CREATININE 1.14 (H) 04/09/2022   BUN 17 04/09/2022   NA 140 04/09/2022   K 3.3 (L) 04/09/2022   CL 98 04/09/2022   CO2 31 04/09/2022      Latest Ref Rng & Units 06/07/2022    2:01 PM 04/09/2022   12:50 PM 02/06/2022    2:23 PM  CBC  WBC 4.0 - 10.5 K/uL 7.5  8.2   9.5   Hemoglobin 12.0 - 15.0 g/dL 8.9  8.9  9.2   Hematocrit 36.0 - 46.0 % 30.0  30.0  30.9   Platelets 150 - 400 K/uL 250  228  275     Lab Results  Component Value Date   HGBA1C 7.6 (A) 04/12/2022   Lab Results  Component Value Date   TSH 2.280 11/14/2021    ================================================== I spent a total of 38 minutes with the patient spent in direct patient consultation.  Additional time spent with chart review  / charting (studies, outside notes, etc): 23 min Total Time: 61 min  Current medicines are reviewed at length with the patient today.  (+/- concerns) none  Notice: This dictation was prepared with Dragon dictation along with smart phrase technology. Any transcriptional errors that result from this process are unintentional and may not be corrected upon review.  Studies Ordered:   Orders Placed This Encounter  Procedures   EKG 12-Lead   ECHOCARDIOGRAM COMPLETE   No orders of the defined types were placed in this encounter.   Patient Instructions / Medication Changes & Studies & Tests Ordered   Patient Instructions  Medication Instructions:   No changes   *If you need a refill on your cardiac medications before your next appointment, please call your pharmacy*   Lab Work:  Not needed    Testing/Procedures:  Will be schedule in 6 months  Feb 2024 Your physician has requested that  you have an echocardiogram. Echocardiography is a painless test that uses sound waves to create images of your heart. It provides your doctor with information about the size and shape of your heart and how well your heart's chambers and valves are working. This procedure takes approximately one hour. There are no restrictions for this procedure.    Follow-Up: At [facility], you and your health needs are our priority.  As part of our continuing mission to provide you with exceptional heart care, we have created designated Provider Care Teams.  These Care  Teams include your primary Cardiologist (physician) and Advanced Practice Providers (APPs -  Physician Assistants and Nurse Practitioners) who all work together to provide you with the care you need, when you need it.  We recommend signing up for the patient portal called "MyChart".  Sign up information is provided on this After Visit Summary.  MyChart is used to connect with patients for Virtual Visits (Telemedicine).  Patients are able to view lab/test results, encounter notes, upcoming appointments, etc.  Non-urgent messages can be sent to your provider as well.   To learn more about what you can do with MyChart, go to [email or web address].    Your next appointment:   6 month(s)after echo   The format for your next appointment:   In Person  Provider:   Glenetta Hew, MD    Other Instructions      Leonie Man, MD, MS Glenetta Hew, M.D., M.S. Interventional Cardiologist  [place]  Pager # [phone] Phone # [phone] [address] [place], [place] [place] [id number]   Thank you for choosing [facility]!!

## 2022-06-13 ENCOUNTER — Ambulatory Visit
Admission: RE | Admit: 2022-06-13 | Discharge: 2022-06-13 | Disposition: A | Discharge status: Home / Self Care | Payer: Medicare Other | Source: Ambulatory Visit | Attending: Family Medicine | Admitting: Family Medicine

## 2022-06-13 DIAGNOSIS — Z1231 Encounter for screening mammogram for malignant neoplasm of breast: Secondary | ICD-10-CM | POA: Diagnosis not present

## 2022-06-14 ENCOUNTER — Ambulatory Visit (INDEPENDENT_AMBULATORY_CARE_PROVIDER_SITE_OTHER): Payer: Medicare Other | Admitting: *Deleted

## 2022-06-14 VITALS — BP 116/64 | HR 80 | Ht 64.0 in | Wt 240.6 lb

## 2022-06-14 DIAGNOSIS — Z0000 Encounter for general adult medical examination without abnormal findings: Secondary | ICD-10-CM

## 2022-06-14 NOTE — Progress Notes (Signed)
Subjective:   Lynn [name] is a 66 y.o. female who presents for Medicare Annual (Subsequent) preventive examination.  Review of Systems    Defer to PCP Cardiac Risk Factors include: advanced age (>35mn, >>62women);diabetes mellitus;dyslipidemia;hypertension;obesity (BMI >30kg/m2)     Objective:    Today's Vitals   06/14/22 1315  BP: 116/64  Pulse: 80  SpO2: 95%  Weight: 240 lb 9.6 oz (109.1 kg)  Height: _0  (1.626 m)   Body mass index is 41.3 kg/m.     06/14/2022    1:17 PM 06/07/2022    3:00 PM 04/09/2022    1:21 PM 02/06/2022    2:50 PM 11/13/2021    9:15 PM 08/15/2021    9:09 AM 08/08/2021   10:44 AM  Advanced Directives  Does Patient Have a Medical Advance Directive?  _1  No  Would patient like information on creating a medical advance directive? No - Patient declined No - Patient declined No - Patient declined No - Patient declined No - Patient declined No - Patient declined No - Patient declined    Current Medications (verified) Outpatient Encounter Medications as of 06/14/2022  Medication Sig   acetaminophen (TYLENOL) 500 MG tablet Take 500 mg by mouth every 6 (six) hours as needed for fever.   albuterol (VENTOLIN HFA) 108 (90 Base) MCG/ACT inhaler Inhale 2 puffs into the lungs every 6 (six) hours as needed for wheezing or shortness of breath.   apixaban (ELIQUIS) 5 MG TABS tablet Take 1 tablet (5 mg total) by mouth 2 (two) times daily.   atorvastatin (LIPITOR) 80 MG tablet TAKE 1 TABLET BY MOUTH EVERY EVENING   cetirizine (ZYRTEC) 10 MG tablet Take 10 mg by mouth daily as needed for allergies.   Continuous Blood Gluc Sensor (DEXCOM G6 SENSOR) MISC 1 Device by Does not apply route as directed.   Continuous Blood Gluc Transmit (DEXCOM G6 TRANSMITTER) MISC 1 Device by Does not apply route as directed.   diltiazem (CARDIZEM CD) 360 MG 24 hr capsule TAKE 1 CAPSULE BY MOUTH EVERY DAY   folic acid (FOLVITE) 1 MG tablet Take 2 tablets (2 mg total)  by mouth daily. (Patient taking differently: Take 1 mg by mouth in the morning and at bedtime.)   furosemide (LASIX) 20 MG tablet Decrease Lasix ( furosemide)  to 20 mg ( 1/2/tablet)  , with 20 mg  you may take a an additional 20 mg if need for swelling or weight gain more than 3 lbs overnight or 5 lbs in one week -- if you have continue to take an additional 20 mg , go back to 40 mg  Take additional dose as needed for egt gain> 3 lb in 1 day   glucose blood (CONTOUR NEXT TEST) test strip 3x daily   hydroxychloroquine (PLAQUENIL) 200 MG tablet Take 200 mg by mouth 2 (two) times daily.   Insulin Disposable Pump (OMNIPOD 5 G6 INTRO, GEN 5,) KIT 1 Device by Does not apply route every other day.   Insulin Disposable Pump (OMNIPOD 5 G6 POD, GEN 5,) MISC 1 Device by Does not apply route every other day.   insulin lispro (HUMALOG) 100 UNIT/ML injection Max Daily 100 units per pump   Insulin Pen Needle (B-D UF III MINI PEN NEEDLES) 31G X 5 MM MISC USE DAILY WITH VICTOZA AND LANTUS. (Patient taking differently: Use for ozempic)   Insulin Pen Needle 31G X 5 MM MISC 1 Device by Does not apply route  3 (three) times daily.   Lancets 30G MISC 1 Device by Does not apply route 3 (three) times daily.   leflunomide (ARAVA) 20 MG tablet Take 20 mg by mouth daily.   losartan (COZAAR) 100 MG tablet 1 tablet Orally Once a day   Melatonin 5 MG CHEW Chew 5 mg by mouth at bedtime.   Multiple Vitamin (MULTIVITAMIN WITH MINERALS) TABS tablet Take 1 tablet by mouth daily.   Oxymetazoline HCl (VICKS SINEX NA) Place 1 spray into the nose daily as needed (congestion).   pantoprazole (PROTONIX) 40 MG tablet Take 1 tablet (40 mg total) by mouth 2 (two) times daily before a meal.   SAFETY-LOK TB SYRINGE 27GX.5" 27G X 1/2" 1 ML MISC    Semaglutide, 2 MG/DOSE, 8 MG/3ML SOPN Inject 2 mg as directed once a week.   traZODone (DESYREL) 50 MG tablet TAKE 1/2 TO 1 TABLET BY MOUTH AT BEDTIME AS NEEDED FOR SLEEP   No facility-administered  encounter medications on file as of 06/14/2022.    Allergies (verified) Methotrexate, Penicillins, Farxiga [dapagliflozin], Metformin and related, and Milk [milk (cow)]   History: Past Medical History:  Diagnosis Date   Anxiety    Clotting disorder ([place])    blood clot in eye 2016   Coronary artery disease, non-occlusive    a. 11/2016 NSTEMI/Cath: LM nl, LAD 40p, 82m, D1/2 small, LCX 40ost, OM2/3 nl/small, RCA 35 ost/mid, RPDA/RPL small/nl.   Coronary vasospasm ([facility])    Depression    Diastolic dysfunction    a. 11/2016 Echo: EF 55-60%, gr1 DD, sev calcified MV annulus, mildly dil LA.   Eye hemorrhage 01/2015   "right; resolved" (07/03/2017)   Headache    "monthly" (07/03/2017)   History of blood transfusion    "when I had an ectopic pregnancy"   History of stomach ulcers    Hyperlipidemia    Hypertension    Microcytic anemia    Moderate mitral stenosis by prior echocardiogram 03/2021   TTE: Mod MS (mean gradient 9 mmHg). -- TEE Moderate-Severe MS (mean gradient 14 mmHg) with fixed Post MV Leaflet & Severe MAC w/ large circumscribed calcified mass on the Post MV Leaflet. (Mass previously described in 2018) - CMRI identifies calcified mass & not Tumor.; R&LHC - LVEDP & PCWP both 23 mmHg indicating minimal resting gradient   OSA on CPAP    setting is unknown   PAF (paroxysmal atrial fibrillation) ([facility] 03/2021   Event Monitor Aug-Sept 2022: Predominantly sinus rhythm.  1% A. fib burden.  30 brief episodes of PAT.  2 short bursts of NSVT.   PAT (paroxysmal atrial tachycardia) ([facility]    Event monitor from August 2022 showed 30 brief bursts longest 14 seconds.   Pneumonia    "several times" (07/03/2017)   PVC's (premature ventricular contractions)    Rheumatoid arthritis ([facility]    Syncope 07/03/2017 X 2   called seizures but no medications   Thalassemia    "my cells are sickle cell shaped but I don't have sickle cell anemia" (07/03/2017)   Type II diabetes mellitus ([facility]    Past  Surgical History:  Procedure Laterality Date   48-Hour Monitor  03/18/2017   Sinus rhythm with sinus tachycardia (rate 58-134 BPM)multiple PVCs noted with couplets and bigeminy. One triplet. 7 runs of PAT ranging from 100-130 bpm. Longest was 33 beats.   BIOPSY  04/03/2021   Procedure: BIOPSY;  Surgeon: AYetta Flock MD;  [facility];  Service: Gastroenterology;;   BREAST BIOPSY Left  BRONCHIAL WASHINGS  03/28/2021   Procedure: BRONCHIAL WASHINGS;  Surgeon: Julian Hy, DO;  Location: [facility];  Service: Endoscopy;;   CARDIAC CATHETERIZATION N/A 11/19/2016   Procedure: Left Heart Cath and Coronary Angiography;  Surgeon: Belva Crome, MD;  Location: [place] [facility];  Service: Cardiovascular.    LM nl, LAD 40p, 61m, D1/2 small, LCX 40ost, OM2/3 nl/small, RCA 35 ost/mid, RPDA/RPL small/nl.   CARDIAC MRI  03/30/2021   Normal LVEF ~53%. 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 No Scar or Late Gadolinium Enhancement on LV Myocardium.   CARPAL TUNNEL RELEASE Right 11/01/2014   COLONOSCOPY     DILATION AND CURETTAGE OF UTERUS     ECTOPIC PREGNANCY SURGERY  X 2   ESOPHAGOGASTRODUODENOSCOPY (EGD) WITH PROPOFOL N/A 04/03/2021   Procedure: ESOPHAGOGASTRODUODENOSCOPY (EGD) WITH PROPOFOL;  Surgeon: AYetta Flock MD;  [facility];  Service: Gastroenterology;  Laterality: N/A;   ESOPHAGOGASTRODUODENOSCOPY (EGD) WITH PROPOFOL N/A 08/15/2021   Procedure: ESOPHAGOGASTRODUODENOSCOPY (EGD) WITH PROPOFOL;  Surgeon: SLadene Artist MD;  Location: [facility];  Service: Endoscopy;  Laterality: N/A;   JOINT REPLACEMENT     KNEE ARTHROSCOPY Left 11/2014   RIGHT/LEFT HEART CATH AND CORONARY ANGIOGRAPHY N/A 08/08/2021   Procedure: RIGHT/LEFT HEART CATH AND CORONARY ANGIOGRAPHY;  Surgeon: HLeonie Man MD;  [facility];  Service: Cardiovascular;Ost LCx 40%. Ost-Prox RCA 35%. Prox-Mid LAD 25%-<25% D2.  Mod-Severely Elevated LVEDP. Mild PA HTN -> PA mean 31 mmHg with a PCWP and LVEDP of 23 mmHg   TEE WITHOUT CARDIOVERSION N/A 03/28/2021   Procedure: TRANSESOPHAGEAL ECHOCARDIOGRAM (TEE);  Surgeon: NThayer Headings MD;  [facility];; LVEF 55-60%. Normal LV Fxn & no RWMA. No LAA thrombus. Gr II Ao Plaque. Large circumscribed calcific mass (1.8 x 1.4 cm) anterior to posterior annulus.  Fixed posterior leaflet with Mod-Severe MS (Mean MVG ~14 mmHg, Peak 20 mmhg).  Mild MR   TONSILLECTOMY     TOTAL KNEE ARTHROPLASTY Right 02/18/2015   Procedure: RIGHT TOTAL KNEE ARTHROPLASTY;  Surgeon: SNetta Cedars MD;  [facility]  Service: Orthopedics;  Laterality: Right;   TOTAL KNEE ARTHROPLASTY Left 08/19/2015   Procedure: LEFT TOTAL KNEE ARTHROPLASTY;  Surgeon: SNetta Cedars MD;  [facility]  Service: Orthopedics;  Laterality: Left;   TRANSTHORACIC ECHOCARDIOGRAM  11/2016; 07/2017:   a) normal LV size, thickness and function. EF 55-60%. GR 1 DD.No RWMA severe mitral annular calcification, but no mitral stenosis. Mild LA dilation;; b) normal LV size and function.  EF 65-75%.  Unable to assess diastolic function.  Aortic sclerosis with no stenosis.  Moderate mitral stenosis? (Gradient 9 mmHg) -?  Mild-mod left atrial enlargement    TRANSTHORACIC ECHOCARDIOGRAM  01/2019   EF 65 to 70%.  Normal function.  Elevated LVEDP-GR 1 DD.  Normal RV size and function.  Large calcific mass on posterior mitral leaflet mild to moderate mitral stenosis.   TRANSTHORACIC ECHOCARDIOGRAM  03/24/2021   EF 60 to 65%.  LV function with normal wall motion.  Moderate basal septal LVH.  GRII DD & Mild LA dilation.-> elevated LVEDP.  Normal RV function.  Mildly elevated PAP.  Ill-defined density measuring 3.23 x 2.1 cm region of the posterior MV leaflet along with dense MAC.  Moderate MS (mean MVG of 9 mmHg.) Normal AoV & RAP. --> Recommend TEE 2/2 concern for MV SBE.   VAGINAL HYSTERECTOMY     VIDEO BRONCHOSCOPY N/A  03/28/2021  Procedure: VIDEO BRONCHOSCOPY WITHOUT FLUORO;  Surgeon: Julian Hy, DO;  [facility];  Service: Endoscopy;  Laterality: N/A;   Family History  Problem Relation Age of Onset   Cancer Mother    Diabetes Mother    Hypertension Mother    Hyperlipidemia Mother    Cancer Father    Hyperlipidemia Father    Mental illness Sister    Diabetes Sister    Diabetes Maternal Grandmother    Diabetes Maternal Grandfather    Colon cancer Neg Hx    Esophageal cancer Neg Hx    Stomach cancer Neg Hx    Rectal cancer Neg Hx    Tremor Neg Hx    Parkinson's disease Neg Hx    Social History   Socioeconomic History   Marital status: Married    Spouse name: Not on file   Number of children: 1   Years of education: Not on file   Highest education level: Not on file  Occupational History   Occupation: retired  Tobacco Use   Smoking status: Former    Packs/day: 0.25    Years: 44.00    Total pack years: 11.00    Types: Cigarettes    Quit date: 02/17/2015    Years since quitting: 7.3   Smokeless tobacco: Never  Vaping Use   Vaping Use: Never used  Substance and Sexual Activity   Alcohol use: Not Currently    Comment: 07/03/2017 "might have a few drinks/year"   Drug use: No   Sexual activity: Never  Other Topics Concern   Not on file  Social History Narrative   Lives at home with husband   Right handed   Caffeine: 1 cup coffee in AM    Social Determinants of Health   Financial Resource Strain: Low Risk  (06/08/2021)   Overall Financial Resource Strain (CARDIA)    Difficulty of Paying Living Expenses: Not hard at all  Food Insecurity: No Food Insecurity (06/08/2021)   Hunger Vital Sign    Worried About Running Out of Food in the Last Year: Never true    Ran Out of Food in the Last Year: Never true  Transportation Needs: No Transportation Needs (06/08/2021)   PRAPARE - Hydrologist (Medical): No    Lack of Transportation (Non-Medical):  No  Physical Activity: Inactive (06/08/2021)   Exercise Vital Sign    Days of Exercise per Week: 0 days    Minutes of Exercise per Session: 0 min  Stress: Stress Concern Present (06/08/2021)   [place]    Feeling of Stress : To some extent  Social Connections: Moderately Isolated (06/08/2021)   Social Connection and Isolation Panel [NHANES]    Frequency of Communication with Friends and Family: More than three times a week    Frequency of Social Gatherings with Friends and Family: More than three times a week    Attends Religious Services: Never    Marine scientist or Organizations: No    Attends Music therapist: Never    Marital Status: Married    Tobacco Counseling Counseling given: Not Answered   Clinical Intake:  Pre-visit preparation completed: Yes  Pain : No/denies pain     Diabetes: Yes CBG done?: No Did pt. bring in CBG monitor from home?: No  How often do you need to have someone help you when you read instructions, pamphlets, or other written materials from your doctor or  pharmacy?: 1 - Never  Diabetic? Yes Nutrition Risk Assessment:  Has the patient had any N/V/D within the last 2 months?  Yes  Does the patient have any non-healing wounds?  No  Has the patient had any unintentional weight loss or weight gain?  No   Diabetes:  Is the patient diabetic?  Yes  If diabetic, was a CBG obtained today?  No  Did the patient bring in their glucometer from home?  No  How often do you monitor your CBG's? Has CGM on arm.   Financial Strains and Diabetes Management:  Are you having any financial strains with the device, your supplies or your medication? No .  Does the patient want to be seen by Chronic Care Management for management of their diabetes?  No  Would the patient like to be referred to a Nutritionist or for Diabetic Management?  No   Diabetic Exams:  Diabetic Eye  Exam: Completed 04/11/22 Diabetic Foot Exam: Completed 04/12/22    Interpreter Needed?: No  Information entered by :: Beatris Ship, [place]   Activities of Daily Living    06/14/2022    1:29 PM 11/13/2021    9:20 PM  In your present state of health, do you have any difficulty performing the following activities:  Hearing? 0   Vision? 0   Difficulty concentrating or making decisions? 0   Walking or climbing stairs? 1   Comment has stair lift   Dressing or bathing? 0   Doing errands, shopping? 0 0  Preparing Food and eating ? N   Using the Toilet? N   In the past six months, have you accidently leaked urine? Y   Do you have problems with loss of bowel control? Y   Managing your Medications? N   Managing your Finances? N   Housekeeping or managing your Housekeeping? N     Patient Care Team: Copland, Gay Filler, MD as PCP - General (Family Medicine) Leonie Man, MD as PCP - Cardiology (Cardiology) Byron, Tami Lin, [place] as Physician Assistant (Cardiology)  Indicate any recent Medical Services you may have received from other than [facility] providers in the past year (date may be approximate).     Assessment:   This is a routine wellness examination for Corrine.  Hearing/Vision screen No results found.  Dietary issues and exercise activities discussed: Current Exercise Habits: Home exercise routine, Type of exercise: Other - see comments;stretching (uses portable foot pedals while sitting on the couch), Time (Minutes): 30, Frequency (Times/Week): 2, Weekly Exercise (Minutes/Week): 60, Intensity: Mild, Exercise limited by: cardiac condition(s);orthopedic condition(s)   Goals Addressed             This Visit's Progress    Patient Stated   On track    Eat healthier & drink more water       Depression Screen    06/14/2022    1:22 PM 06/08/2021    1:10 PM 02/20/2021    1:31 PM 05/01/2019    2:42 PM 01/30/2017    5:22 PM 01/03/2016   10:29 AM 07/13/2015    9:13 AM  PHQ 2/9  Scores  PHQ - 2 Score 1 1 0 0 0 0 0    Fall Risk    06/14/2022    1:18 PM 06/08/2021    1:09 PM 02/20/2021    1:30 PM 05/01/2019    2:41 PM 01/22/2017    8:38 AM  Fall Risk   Falls in the past year? 0 0 1  0 No  Number falls in past yr: 0 0 1    Injury with Fall? 0 0 0    Risk for fall due to : No Fall Risks      Follow up Falls evaluation completed Falls prevention discussed       [place]:  Any stairs in or around the home? Yes  If so, are there any without handrails? No  Home free of loose throw rugs in walkways, pet beds, electrical cords, etc? Yes  Adequate lighting in your home to reduce risk of falls? Yes   ASSISTIVE DEVICES UTILIZED TO PREVENT FALLS:  Life alert? No  Use of a cane, walker or w/c? No  Grab bars in the bathroom? Yes  Shower chair or bench in shower? Yes  Elevated toilet seat or a handicapped toilet? No   TIMED UP AND GO:  Was the test performed? Yes .  Length of time to ambulate 10 feet: 10 sec.   Gait slow and steady without use of assistive device  Cognitive Function:        06/14/2022    1:35 PM  6CIT Screen  What Year? 0 points  What month? 0 points  What time? 0 points  Count back from 20 0 points  Months in reverse 4 points  Repeat phrase 0 points  Total Score 4 points    Immunizations Immunization History  Administered Date(s) Administered   Fluad Quad(high Dose 65+) 07/19/2021   Influenza Split 07/22/2012   Influenza, High Dose Seasonal PF 06/10/2019   Influenza,inj,Quad PF,6+ Mos 10/09/2014, 07/13/2015, 11/17/2016, 07/04/2017, 07/10/2018   Influenza-Unspecified 06/20/2020   Moderna Sars-Covid-2 Vaccination 11/27/2019, 12/26/2019, 06/22/2020, 12/30/2020, 06/21/2021   Pneumococcal Conjugate-13 03/23/2019   Pneumococcal Polysaccharide-23 08/22/2020   Pneumococcal-Unspecified 02/12/2010   Tdap 02/12/2010, 10/17/2016   Zoster Recombinat (Shingrix) 02/20/2021    TDAP status: Up to date  Flu  Vaccine status: Up to date  Pneumococcal vaccine status: Up to date  Covid-19 vaccine status: Information provided on how to obtain vaccines.   Qualifies for Shingles Vaccine? Yes   Zostavax completed No   Shingrix Completed?: No.    Education has been provided regarding the importance of this vaccine. Patient has been advised to call insurance company to determine out of pocket expense if they have not yet received this vaccine. Advised may also receive vaccine at local pharmacy or Health Dept. Verbalized acceptance and understanding.  Screening Tests Health Maintenance  Topic Date Due   Zoster Vaccines- Shingrix (2 of 2) 04/17/2021   COVID-19 Vaccine (6 - Moderna series) 08/16/2021   OPHTHALMOLOGY EXAM  08/19/2021   FOOT EXAM  02/20/2022   INFLUENZA VACCINE  05/15/2022   HEMOGLOBIN A1C  10/12/2022   MAMMOGRAM  06/13/2023   COLONOSCOPY (Pts 45-45yr Insurance coverage will need to be confirmed)  08/23/2026   TETANUS/TDAP  10/17/2026   Pneumonia Vaccine 68 Years old  Completed   DEXA SCAN  Completed   Hepatitis C Screening  Completed   HPV VACCINES  Aged Out    Health Maintenance  Health Maintenance Due  Topic Date Due   Zoster Vaccines- Shingrix (2 of 2) 04/17/2021   COVID-19 Vaccine (6 - Moderna series) 08/16/2021   OPHTHALMOLOGY EXAM  08/19/2021   FOOT EXAM  02/20/2022   INFLUENZA VACCINE  05/15/2022    Colorectal cancer screening: Type of screening: Colonoscopy. Completed 11/917. Repeat every 10 years  Mammogram completed 06/14/22.  Bone Density status: Completed 07/08/19. Results reflect:  Bone density results: NORMAL. Repeat every 2 years.  Lung Cancer Screening: (Low Dose CT Chest recommended if Age 62-80 years, 30 pack-year currently smoking OR have quit w/in 15years.) does not qualify.   Lung Cancer Screening Referral: N/a  Additional Screening:  Hepatitis C Screening: does qualify; Completed 03/25/21  Vision Screening: Recommended annual ophthalmology exams  for early detection of glaucoma and other disorders of the eye. Is the patient up to date with their annual eye exam?  Yes  Who is the provider or what is the name of the office in which the patient attends annual eye exams? [facility] If pt is not established with a provider, would they like to be referred to a provider to establish care? No .   Dental Screening: Recommended annual dental exams for proper oral hygiene  Community Resource Referral / Chronic Care Management: CRR required this visit?  No   CCM required this visit?  No      Plan:     I have personally reviewed and noted the following in the patient's chart:   Medical and social history Use of alcohol, tobacco or illicit drugs  Current medications and supplements including opioid prescriptions. Patient is not currently taking opioid prescriptions. Functional ability and status Nutritional status Physical activity Advanced directives List of other physicians Hospitalizations, surgeries, and ER visits in previous 12 months Vitals Screenings to include cognitive, depression, and falls Referrals and appointments  In addition, I have reviewed and discussed with patient certain preventive protocols, quality metrics, and best practice recommendations. A written personalized care plan for preventive services as well as general preventive health recommendations were provided to patient.     Beatris Ship, [place]   06/14/2022   Nurse Notes: None

## 2022-06-14 NOTE — Patient Instructions (Signed)
[name] , Thank you for taking time to come for your Medicare Wellness Visit. I appreciate your ongoing commitment to your health goals. Please review the following plan we discussed and let me know if I can assist you in the future.   These are the goals we discussed:  Goals      Patient Stated     Eat healthier & drink more water        This is a list of the screening recommended for you and due dates:  Health Maintenance  Topic Date Due   Zoster (Shingles) Vaccine (2 of 2) 04/17/2021   COVID-19 Vaccine (6 - Moderna series) 08/16/2021   Eye exam for diabetics  08/19/2021   Complete foot exam   02/20/2022   Flu Shot  05/15/2022   Hemoglobin A1C  10/12/2022   Mammogram  06/13/2023   Colon Cancer Screening  08/23/2026   Tetanus Vaccine  10/17/2026   Pneumonia Vaccine  Completed   DEXA scan (bone density measurement)  Completed   Hepatitis C Screening: USPSTF Recommendation to screen - Ages 68-68 yo.  Completed   HPV Vaccine  Aged Out       Next appointment: Follow up in one year for your annual wellness visit    Preventive Care 65 Years and Older, Female Preventive care refers to lifestyle choices and visits with your health care provider that can promote health and wellness. What does preventive care include? A yearly physical exam. This is also called an annual well check. Dental exams once or twice a year. Routine eye exams. Ask your health care provider how often you should have your eyes checked. Personal lifestyle choices, including: Daily care of your teeth and gums. Regular physical activity. Eating a healthy diet. Avoiding tobacco and drug use. Limiting alcohol use. Practicing safe sex. Taking low-dose aspirin every day. Taking vitamin and mineral supplements as recommended by your health care provider. What happens during an annual well check? The services and screenings done by your health care provider during your annual well check will depend on  your age, overall health, lifestyle risk factors, and family history of disease. Counseling  Your health care provider may ask you questions about your: Alcohol use. Tobacco use. Drug use. Emotional well-being. Home and relationship well-being. Sexual activity. Eating habits. History of falls. Memory and ability to understand (cognition). Work and work Statistician. Reproductive health. Screening  You may have the following tests or measurements: Height, weight, and BMI. Blood pressure. Lipid and cholesterol levels. These may be checked every 5 years, or more frequently if you are over 68 years old. Skin check. Lung cancer screening. You may have this screening every year starting at age 68 if you have a 30-pack-year history of smoking and currently smoke or have quit within the past 15 years. Fecal occult blood test (FOBT) of the stool. You may have this test every year starting at age 688. Flexible sigmoidoscopy or colonoscopy. You may have a sigmoidoscopy every 5 years or a colonoscopy every 10 years starting at age 688. Hepatitis C blood test. Hepatitis B blood test. Sexually transmitted disease (STD) testing. Diabetes screening. This is done by checking your blood sugar (glucose) after you have not eaten for a while (fasting). You may have this done every 1-3 years. Bone density scan. This is done to screen for osteoporosis. You may have this done starting at age 688. Mammogram. This may be done every 1-2 years. Talk to your health care provider about  how often you should have regular mammograms. Talk with your health care provider about your test results, treatment options, and if necessary, the need for more tests. Vaccines  Your health care provider may recommend certain vaccines, such as: Influenza vaccine. This is recommended every year. Tetanus, diphtheria, and acellular pertussis (Tdap, Td) vaccine. You may need a Td booster every 10 years. Zoster vaccine. You may need this  after age 68. Pneumococcal 13-valent conjugate (PCV13) vaccine. One dose is recommended after age 68. Pneumococcal polysaccharide (PPSV23) vaccine. One dose is recommended after age 68. Talk to your health care provider about which screenings and vaccines you need and how often you need them. This information is not intended to replace advice given to you by your health care provider. Make sure you discuss any questions you have with your health care provider. Document Released: 10/28/2015 Document Revised: 06/20/2016 Document Reviewed: 08/02/2015 Elsevier Interactive Patient Education  2017 [place] Prevention in the Home Falls can cause injuries. They can happen to people of all ages. There are many things you can do to make your home safe and to help prevent falls. What can I do on the outside of my home? Regularly fix the edges of walkways and driveways and fix any cracks. Remove anything that might make you trip as you walk through a door, such as a raised step or threshold. Trim any bushes or trees on the path to your home. Use bright outdoor lighting. Clear any walking paths of anything that might make someone trip, such as rocks or tools. Regularly check to see if handrails are loose or broken. Make sure that both sides of any steps have handrails. Any raised decks and porches should have guardrails on the edges. Have any leaves, snow, or ice cleared regularly. Use sand or salt on walking paths during winter. Clean up any spills in your garage right away. This includes oil or grease spills. What can I do in the bathroom? Use night lights. Install grab bars by the toilet and in the tub and shower. Do not use towel bars as grab bars. Use non-skid mats or decals in the tub or shower. If you need to sit down in the shower, use a plastic, non-slip stool. Keep the floor dry. Clean up any water that spills on the floor as soon as it happens. Remove soap buildup in the tub or  shower regularly. Attach bath mats securely with double-sided non-slip rug tape. Do not have throw rugs and other things on the floor that can make you trip. What can I do in the bedroom? Use night lights. Make sure that you have a light by your bed that is easy to reach. Do not use any sheets or blankets that are too big for your bed. They should not hang down onto the floor. Have a firm chair that has side arms. You can use this for support while you get dressed. Do not have throw rugs and other things on the floor that can make you trip. What can I do in the kitchen? Clean up any spills right away. Avoid walking on wet floors. Keep items that you use a lot in easy-to-reach places. If you need to reach something above you, use a strong step stool that has a grab bar. Keep electrical cords out of the way. Do not use floor polish or wax that makes floors slippery. If you must use wax, use non-skid floor wax. Do not have throw rugs and other  things on the floor that can make you trip. What can I do with my stairs? Do not leave any items on the stairs. Make sure that there are handrails on both sides of the stairs and use them. Fix handrails that are broken or loose. Make sure that handrails are as long as the stairways. Check any carpeting to make sure that it is firmly attached to the stairs. Fix any carpet that is loose or worn. Avoid having throw rugs at the top or bottom of the stairs. If you do have throw rugs, attach them to the floor with carpet tape. Make sure that you have a light switch at the top of the stairs and the bottom of the stairs. If you do not have them, ask someone to add them for you. What else can I do to help prevent falls? Wear shoes that: Do not have high heels. Have rubber bottoms. Are comfortable and fit you well. Are closed at the toe. Do not wear sandals. If you use a stepladder: Make sure that it is fully opened. Do not climb a closed stepladder. Make  sure that both sides of the stepladder are locked into place. Ask someone to hold it for you, if possible. Clearly mark and make sure that you can see: Any grab bars or handrails. First and last steps. Where the edge of each step is. Use tools that help you move around (mobility aids) if they are needed. These include: Canes. Walkers. Scooters. Crutches. Turn on the lights when you go into a dark area. Replace any light bulbs as soon as they burn out. Set up your furniture so you have a clear path. Avoid moving your furniture around. If any of your floors are uneven, fix them. If there are any pets around you, be aware of where they are. Review your medicines with your doctor. Some medicines can make you feel dizzy. This can increase your chance of falling. Ask your doctor what other things that you can do to help prevent falls. This information is not intended to replace advice given to you by your health care provider. Make sure you discuss any questions you have with your health care provider. Document Released: 07/28/2009 Document Revised: 03/08/2016 Document Reviewed: 11/05/2014 Elsevier Interactive Patient Education  2017 [employer].

## 2022-06-15 ENCOUNTER — Other Ambulatory Visit: Payer: Self-pay | Admitting: Family Medicine

## 2022-06-15 DIAGNOSIS — I214 Non-ST elevation (NSTEMI) myocardial infarction: Secondary | ICD-10-CM

## 2022-06-15 DIAGNOSIS — E1169 Type 2 diabetes mellitus with other specified complication: Secondary | ICD-10-CM

## 2022-06-18 ENCOUNTER — Other Ambulatory Visit: Payer: Self-pay | Admitting: Gastroenterology

## 2022-06-18 IMAGING — MG MM DIGITAL SCREENING BILAT W/ TOMO AND CAD
6 of 12 series · 6 of 36 positions shown · non-contrast
Comparison: Previous exam(s).

ACR Breast Density Category a: The breast tissue is almost entirely
fatty.

CLINICAL DATA: Screening.

EXAM:
DIGITAL SCREENING BILATERAL MAMMOGRAM WITH TOMOSYNTHESIS AND CAD
TECHNIQUE: Bilateral screening digital craniocaudal and mediolateral oblique
mammograms were obtained. Bilateral screening digital breast
tomosynthesis was performed. The images were evaluated with
computer-aided detection.

[R MLO synth-2D (1 of 2)]
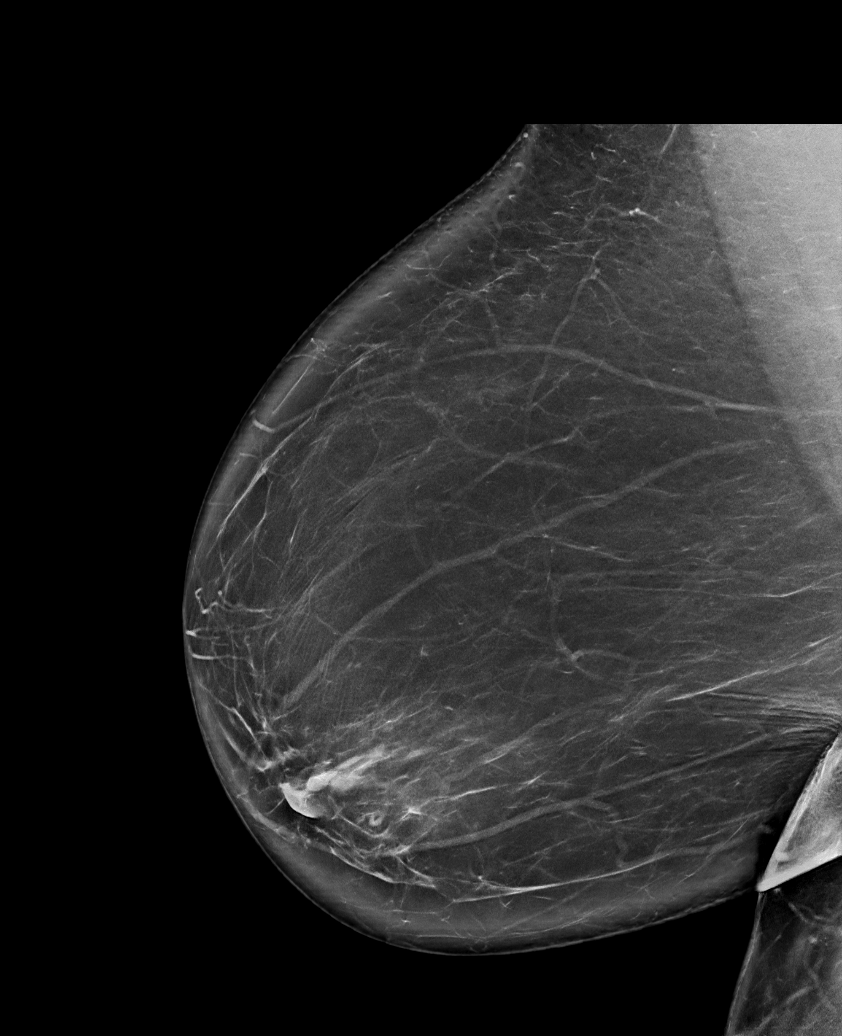

[R CC synth-2D (1 of 2)]
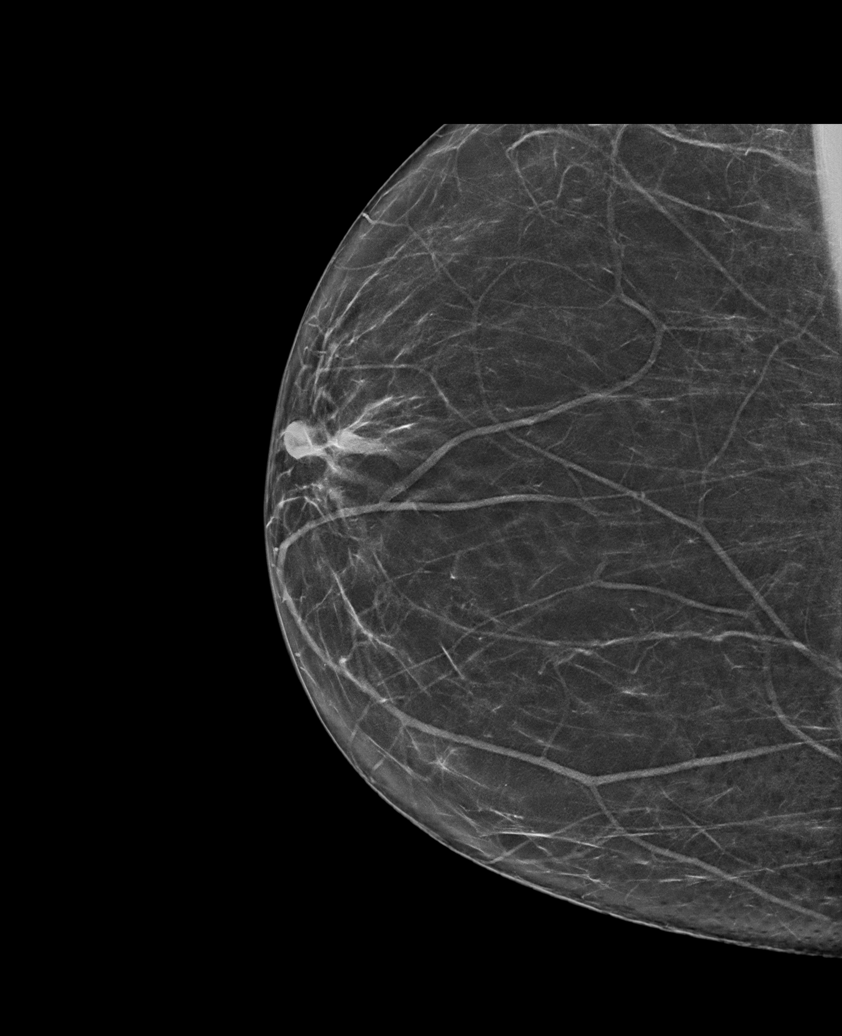

[R CC synth-2D (2 of 2)]
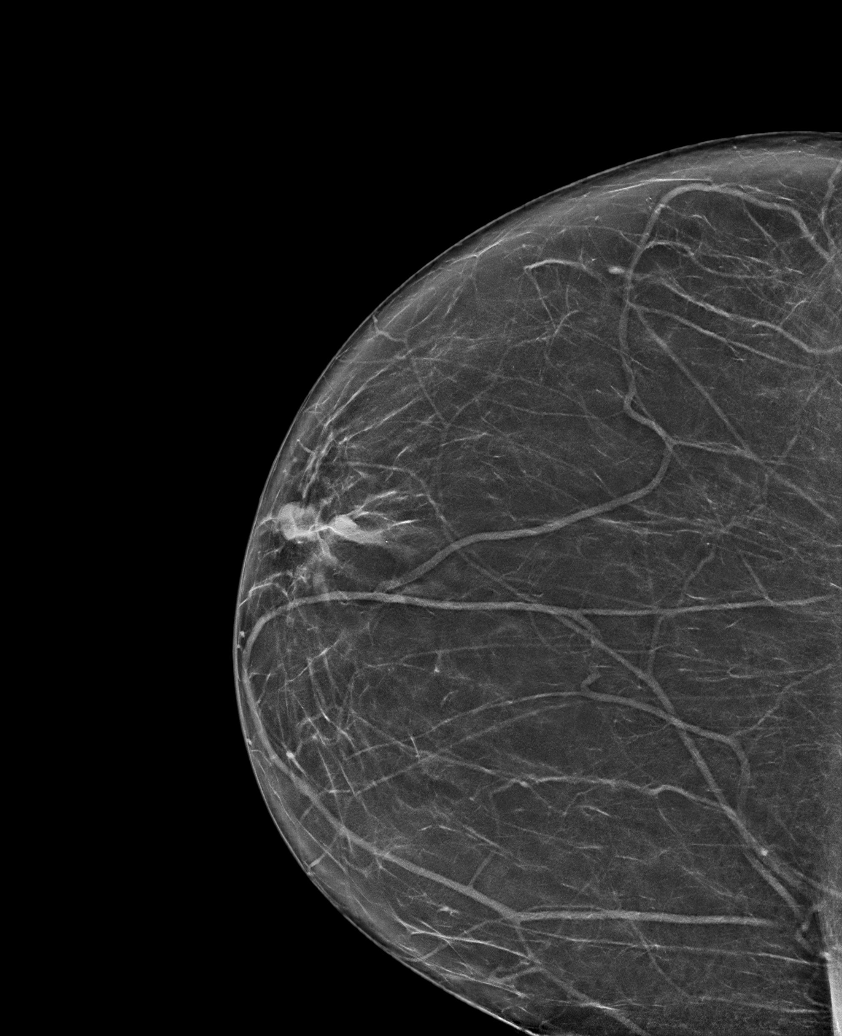

[L CC synth-2D]
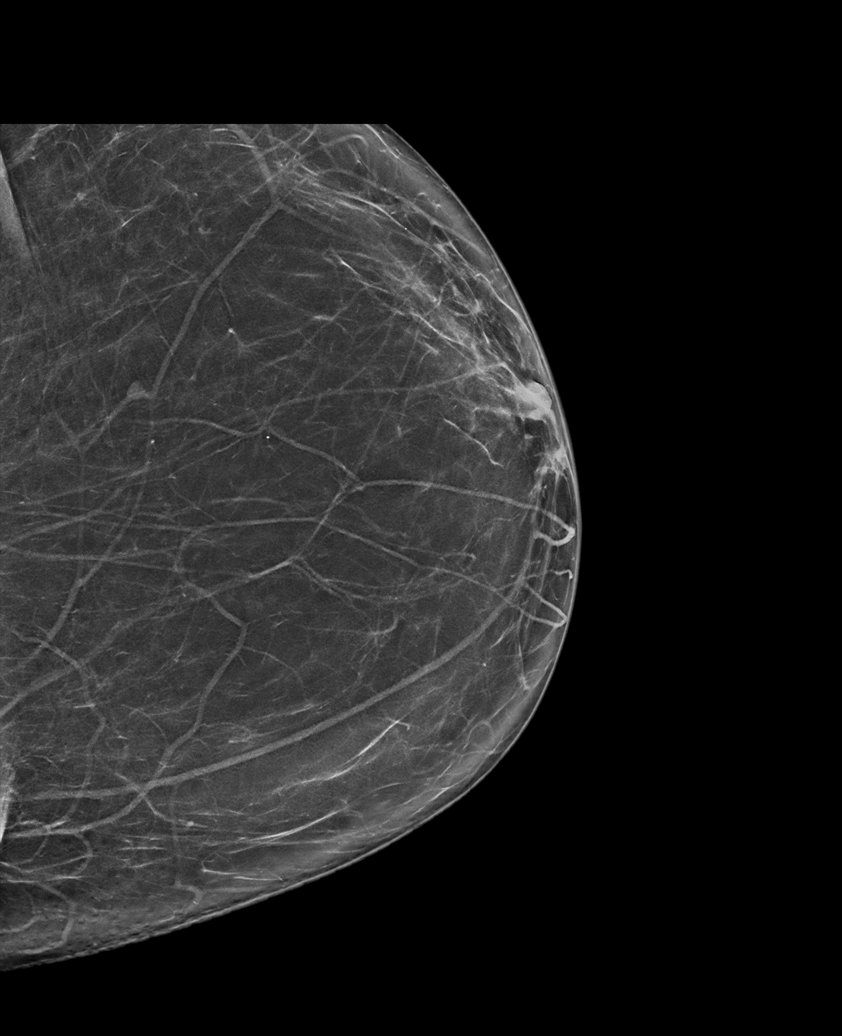

[R MLO synth-2D (2 of 2)]
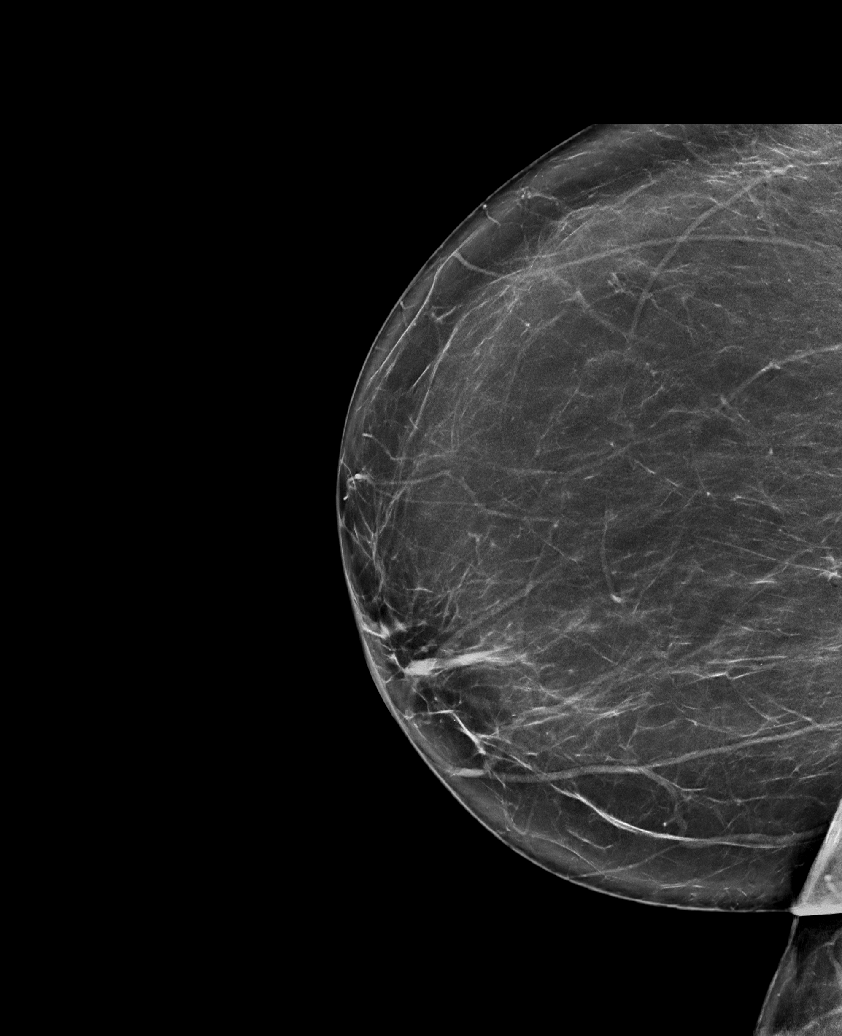

[L MLO synth-2D]
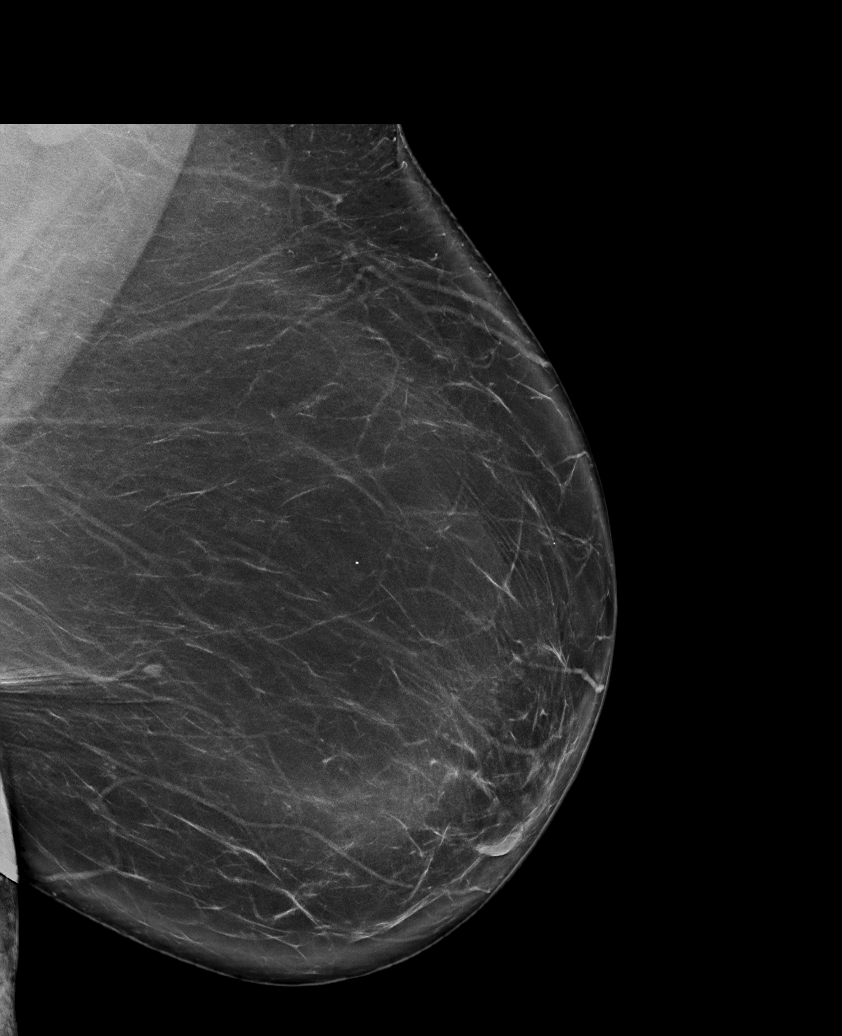

[6 of 36 positions shown; findings below may reference images not displayed]

FINDINGS: There are no findings suspicious for malignancy.
IMPRESSION: No mammographic evidence of malignancy. A result letter of this
screening mammogram will be mailed directly to the patient.

RECOMMENDATION:
Screening mammogram in one year. (Code:[id number]-[id number])

BI-RADS CATEGORY  1: Negative.

## 2022-06-20 ENCOUNTER — Other Ambulatory Visit: Payer: Self-pay | Admitting: Cardiology

## 2022-06-20 ENCOUNTER — Other Ambulatory Visit: Payer: Self-pay | Admitting: Gastroenterology

## 2022-06-20 ENCOUNTER — Other Ambulatory Visit: Payer: Self-pay | Admitting: Family Medicine

## 2022-06-20 DIAGNOSIS — I214 Non-ST elevation (NSTEMI) myocardial infarction: Secondary | ICD-10-CM

## 2022-06-20 DIAGNOSIS — E1169 Type 2 diabetes mellitus with other specified complication: Secondary | ICD-10-CM

## 2022-06-23 ENCOUNTER — Encounter: Payer: Self-pay | Admitting: Cardiology

## 2022-06-23 NOTE — Assessment & Plan Note (Signed)
Continue follow-up with pulmonary medicine.

## 2022-06-23 NOTE — Assessment & Plan Note (Deleted)
Probably multifactorial between her underlying lung disease, some diastolic heart function from mitral stenosis and also obesity.  She is quite deconditioned.  All are contributing.  Needs to continue to gradually build up her exercise level.  Coronary arteries are relatively stable with minimal disease.  Major cardiac constraint of the mitral valve.  Needs to be small with exercise, and stop if this makes the point of incapacitating dyspnea.

## 2022-06-23 NOTE — Assessment & Plan Note (Signed)
Complicating factor and that she has both PAT and low burden of PAF.  Unfortunately she does still have PAF.  Remains on diltiazem 360 mg daily.  Seems to be stable.  Monitor reviewed.  Short bursts but nothing significant.

## 2022-06-23 NOTE — Assessment & Plan Note (Signed)
No recurrent issues.  She would definitely not do well where she did get overly dehydrated.  I agree with low-dose of diuresis at baseline and using additional dose as necessary based on weight

## 2022-06-23 NOTE — Assessment & Plan Note (Signed)
I have not seen labs checked in over a year.  She should be due for lipid panel checked soon.  If not checked by PCP by next visit, we can check in follow-up visit.  In 6 months.

## 2022-06-23 NOTE — Assessment & Plan Note (Signed)
Overall low burden.  Reassuring due to how symptomatic she was while in A-fib.  If she were to have significant recurrence, would probably need to consider antiarrhythmic agent.  For now we will hold off her rhythm agent unless episodes are more frequent.  Continue diltiazem 306 mg daily for rate control and Eliquis for DOAC.  No bleeding issues.

## 2022-06-23 NOTE — Assessment & Plan Note (Signed)
No significant CAD noted on cath.  Presumably some microvascular disease with some spasm.  Continues to be on high-dose atorvastatin.  Is also on diltiazem which will help for spasm.  No aspirin because of Eliquis.

## 2022-06-23 NOTE — Assessment & Plan Note (Signed)
Continue working on weight loss.  Discussed importance of dietary modification and trying to increase exercise.

## 2022-06-23 NOTE — Assessment & Plan Note (Signed)
Pretty much stable Moderate MS on most recent echo.  She would be a very difficult surgical option based on the amount of calcification on leaflets.  She has had a cardiac MRI and TEE to assess this mass which is basically a calcified nodule.  We will continue to monitor relatively frequently over the next several years to ensure there is no progression of disease.  Due for follow-up in February 2024

## 2022-06-23 NOTE — Assessment & Plan Note (Signed)
Probably multifactorial between her underlying lung disease, some diastolic heart function from mitral stenosis and also obesity.  She is quite deconditioned.  All are contributing.  Needs to continue to gradually build up her exercise level.  Coronary arteries are relatively stable with minimal disease.  Major cardiac constraint of the mitral valve.  Needs to be small with exercise, and stop if this makes the point of incapacitating dyspnea.

## 2022-06-25 ENCOUNTER — Inpatient Hospital Stay: Payer: Medicare Other | Attending: Hematology & Oncology

## 2022-06-25 VITALS — BP 122/62 | HR 70 | Temp 98.0°F | Resp 18

## 2022-06-25 DIAGNOSIS — K253 Acute gastric ulcer without hemorrhage or perforation: Secondary | ICD-10-CM

## 2022-06-25 DIAGNOSIS — D500 Iron deficiency anemia secondary to blood loss (chronic): Secondary | ICD-10-CM

## 2022-06-25 DIAGNOSIS — D509 Iron deficiency anemia, unspecified: Secondary | ICD-10-CM | POA: Diagnosis not present

## 2022-06-25 DIAGNOSIS — K2210 Ulcer of esophagus without bleeding: Secondary | ICD-10-CM

## 2022-06-25 MED ORDER — SODIUM CHLORIDE 0.9% FLUSH: 10.0000 mL | Freq: Once | INTRAVENOUS | Status: DC | PRN

## 2022-06-25 MED ORDER — SODIUM CHLORIDE 0.9 % IV SOLN: Freq: Once | INTRAVENOUS | Status: AC

## 2022-06-25 MED ORDER — SODIUM CHLORIDE 0.9% FLUSH: 3.0000 mL | Freq: Once | INTRAVENOUS | Status: DC | PRN

## 2022-06-25 MED ORDER — SODIUM CHLORIDE 0.9 % IV SOLN
510.0000 mg | Freq: Once | INTRAVENOUS | Status: AC
  Administered 2022-06-25: 510 mg via INTRAVENOUS
  Filled 2022-06-25: qty 510

## 2022-06-25 NOTE — Patient Instructions (Signed)

## 2022-07-11 ENCOUNTER — Ambulatory Visit: Payer: Medicare Other | Admitting: Internal Medicine

## 2022-07-31 ENCOUNTER — Encounter: Payer: Self-pay | Admitting: Family Medicine

## 2022-07-31 DIAGNOSIS — J704 Drug-induced interstitial lung disorders, unspecified: Secondary | ICD-10-CM | POA: Diagnosis not present

## 2022-07-31 DIAGNOSIS — M069 Rheumatoid arthritis, unspecified: Secondary | ICD-10-CM

## 2022-07-31 DIAGNOSIS — M0579 Rheumatoid arthritis with rheumatoid factor of multiple sites without organ or systems involvement: Secondary | ICD-10-CM | POA: Diagnosis not present

## 2022-07-31 DIAGNOSIS — G4700 Insomnia, unspecified: Secondary | ICD-10-CM

## 2022-07-31 DIAGNOSIS — E1169 Type 2 diabetes mellitus with other specified complication: Secondary | ICD-10-CM

## 2022-07-31 DIAGNOSIS — R5382 Chronic fatigue, unspecified: Secondary | ICD-10-CM | POA: Diagnosis not present

## 2022-07-31 DIAGNOSIS — I214 Non-ST elevation (NSTEMI) myocardial infarction: Secondary | ICD-10-CM

## 2022-07-31 NOTE — Telephone Encounter (Signed)
All that has your name on it is: Atorvastatin, Folic Acid, Pen Needle, Lancet, and Trazodone.  Also she has not been seen since April. I did not see where she was supposed to return.

## 2022-08-01 MED ORDER — ATORVASTATIN CALCIUM 80 MG PO TABS: 80.0000 mg | ORAL_TABLET | Freq: Every evening | ORAL | 3 refills | Status: DC

## 2022-08-01 MED ORDER — TRAZODONE HCL 50 MG PO TABS: 25.0000 mg | ORAL_TABLET | Freq: Every evening | ORAL | 2 refills | Status: DC | PRN

## 2022-08-01 NOTE — Addendum Note (Signed)
Addended by: Lamar Blinks C on: 08/01/2022 09:05 AM   Modules accepted: Orders

## 2022-08-03 DIAGNOSIS — E1165 Type 2 diabetes mellitus with hyperglycemia: Secondary | ICD-10-CM | POA: Diagnosis not present

## 2022-08-03 DIAGNOSIS — Z794 Long term (current) use of insulin: Secondary | ICD-10-CM | POA: Diagnosis not present

## 2022-08-13 ENCOUNTER — Other Ambulatory Visit: Payer: Self-pay

## 2022-08-13 ENCOUNTER — Encounter: Payer: Self-pay | Admitting: Internal Medicine

## 2022-08-13 MED ORDER — OMNIPOD 5 DEXG7G6 PODS GEN 5 MISC: 1.0000 | 3 refills | Status: DC

## 2022-08-24 DIAGNOSIS — E1165 Type 2 diabetes mellitus with hyperglycemia: Secondary | ICD-10-CM | POA: Diagnosis not present

## 2022-08-24 DIAGNOSIS — Z794 Long term (current) use of insulin: Secondary | ICD-10-CM | POA: Diagnosis not present

## 2022-08-27 ENCOUNTER — Encounter: Payer: Self-pay | Admitting: Internal Medicine

## 2022-08-27 MED ORDER — TIRZEPATIDE 5 MG/0.5ML ~~LOC~~ SOAJ: 5.0000 mg | SUBCUTANEOUS | 2 refills | Status: DC

## 2022-09-03 DIAGNOSIS — I5032 Chronic diastolic (congestive) heart failure: Secondary | ICD-10-CM | POA: Diagnosis not present

## 2022-09-03 DIAGNOSIS — E1122 Type 2 diabetes mellitus with diabetic chronic kidney disease: Secondary | ICD-10-CM | POA: Diagnosis not present

## 2022-09-03 DIAGNOSIS — I129 Hypertensive chronic kidney disease with stage 1 through stage 4 chronic kidney disease, or unspecified chronic kidney disease: Secondary | ICD-10-CM | POA: Diagnosis not present

## 2022-09-03 DIAGNOSIS — N1832 Chronic kidney disease, stage 3b: Secondary | ICD-10-CM | POA: Diagnosis not present

## 2022-09-04 ENCOUNTER — Encounter: Payer: Self-pay | Admitting: Family Medicine

## 2022-09-04 ENCOUNTER — Inpatient Hospital Stay (HOSPITAL_COMMUNITY)
Admission: EM | Admit: 2022-09-04 | Discharge: 2022-09-06 | DRG: 378 | Disposition: A | Discharge status: Home / Self Care | Payer: Medicare Other | Attending: Internal Medicine | Admitting: Internal Medicine

## 2022-09-04 ENCOUNTER — Emergency Department (HOSPITAL_COMMUNITY): Payer: Medicare Other

## 2022-09-04 DIAGNOSIS — I480 Paroxysmal atrial fibrillation: Secondary | ICD-10-CM | POA: Diagnosis not present

## 2022-09-04 DIAGNOSIS — I700 Atherosclerosis of aorta: Secondary | ICD-10-CM | POA: Diagnosis present

## 2022-09-04 DIAGNOSIS — Z880 Allergy status to penicillin: Secondary | ICD-10-CM

## 2022-09-04 DIAGNOSIS — R195 Other fecal abnormalities: Secondary | ICD-10-CM | POA: Diagnosis not present

## 2022-09-04 DIAGNOSIS — J449 Chronic obstructive pulmonary disease, unspecified: Secondary | ICD-10-CM | POA: Diagnosis present

## 2022-09-04 DIAGNOSIS — K31811 Angiodysplasia of stomach and duodenum with bleeding: Principal | ICD-10-CM | POA: Diagnosis present

## 2022-09-04 DIAGNOSIS — D563 Thalassemia minor: Secondary | ICD-10-CM | POA: Diagnosis present

## 2022-09-04 DIAGNOSIS — Z6841 Body Mass Index (BMI) 40.0 and over, adult: Secondary | ICD-10-CM | POA: Diagnosis not present

## 2022-09-04 DIAGNOSIS — K649 Unspecified hemorrhoids: Secondary | ICD-10-CM | POA: Diagnosis not present

## 2022-09-04 DIAGNOSIS — Z87891 Personal history of nicotine dependence: Secondary | ICD-10-CM

## 2022-09-04 DIAGNOSIS — E66813 Obesity, class 3: Secondary | ICD-10-CM | POA: Diagnosis present

## 2022-09-04 DIAGNOSIS — Z1152 Encounter for screening for COVID-19: Secondary | ICD-10-CM

## 2022-09-04 DIAGNOSIS — D62 Acute posthemorrhagic anemia: Secondary | ICD-10-CM | POA: Diagnosis present

## 2022-09-04 DIAGNOSIS — I2510 Atherosclerotic heart disease of native coronary artery without angina pectoris: Secondary | ICD-10-CM | POA: Diagnosis not present

## 2022-09-04 DIAGNOSIS — E1169 Type 2 diabetes mellitus with other specified complication: Secondary | ICD-10-CM | POA: Diagnosis not present

## 2022-09-04 DIAGNOSIS — Z833 Family history of diabetes mellitus: Secondary | ICD-10-CM

## 2022-09-04 DIAGNOSIS — I10 Essential (primary) hypertension: Secondary | ICD-10-CM | POA: Diagnosis present

## 2022-09-04 DIAGNOSIS — I252 Old myocardial infarction: Secondary | ICD-10-CM

## 2022-09-04 DIAGNOSIS — J84178 Other interstitial pulmonary diseases with fibrosis in diseases classified elsewhere: Secondary | ICD-10-CM | POA: Diagnosis not present

## 2022-09-04 DIAGNOSIS — E785 Hyperlipidemia, unspecified: Secondary | ICD-10-CM | POA: Diagnosis not present

## 2022-09-04 DIAGNOSIS — I5032 Chronic diastolic (congestive) heart failure: Secondary | ICD-10-CM | POA: Diagnosis not present

## 2022-09-04 DIAGNOSIS — T451X5A Adverse effect of antineoplastic and immunosuppressive drugs, initial encounter: Secondary | ICD-10-CM | POA: Diagnosis not present

## 2022-09-04 DIAGNOSIS — K2289 Other specified disease of esophagus: Secondary | ICD-10-CM | POA: Diagnosis not present

## 2022-09-04 DIAGNOSIS — D649 Anemia, unspecified: Principal | ICD-10-CM

## 2022-09-04 DIAGNOSIS — R0602 Shortness of breath: Secondary | ICD-10-CM | POA: Diagnosis not present

## 2022-09-04 DIAGNOSIS — I110 Hypertensive heart disease with heart failure: Secondary | ICD-10-CM | POA: Diagnosis not present

## 2022-09-04 DIAGNOSIS — Z8719 Personal history of other diseases of the digestive system: Secondary | ICD-10-CM

## 2022-09-04 DIAGNOSIS — K922 Gastrointestinal hemorrhage, unspecified: Secondary | ICD-10-CM | POA: Diagnosis present

## 2022-09-04 DIAGNOSIS — Z90710 Acquired absence of both cervix and uterus: Secondary | ICD-10-CM

## 2022-09-04 DIAGNOSIS — J9611 Chronic respiratory failure with hypoxia: Secondary | ICD-10-CM | POA: Diagnosis present

## 2022-09-04 DIAGNOSIS — D569 Thalassemia, unspecified: Secondary | ICD-10-CM | POA: Diagnosis present

## 2022-09-04 DIAGNOSIS — K449 Diaphragmatic hernia without obstruction or gangrene: Secondary | ICD-10-CM | POA: Diagnosis present

## 2022-09-04 DIAGNOSIS — Z7901 Long term (current) use of anticoagulants: Secondary | ICD-10-CM

## 2022-09-04 DIAGNOSIS — Z79899 Other long term (current) drug therapy: Secondary | ICD-10-CM

## 2022-09-04 DIAGNOSIS — I342 Nonrheumatic mitral (valve) stenosis: Secondary | ICD-10-CM | POA: Diagnosis not present

## 2022-09-04 DIAGNOSIS — F419 Anxiety disorder, unspecified: Secondary | ICD-10-CM | POA: Diagnosis present

## 2022-09-04 DIAGNOSIS — D509 Iron deficiency anemia, unspecified: Secondary | ICD-10-CM | POA: Diagnosis present

## 2022-09-04 DIAGNOSIS — J849 Interstitial pulmonary disease, unspecified: Secondary | ICD-10-CM | POA: Diagnosis not present

## 2022-09-04 DIAGNOSIS — M069 Rheumatoid arthritis, unspecified: Secondary | ICD-10-CM | POA: Diagnosis not present

## 2022-09-04 DIAGNOSIS — I509 Heart failure, unspecified: Secondary | ICD-10-CM | POA: Diagnosis not present

## 2022-09-04 DIAGNOSIS — Z9981 Dependence on supplemental oxygen: Secondary | ICD-10-CM

## 2022-09-04 DIAGNOSIS — Z794 Long term (current) use of insulin: Secondary | ICD-10-CM | POA: Diagnosis not present

## 2022-09-04 DIAGNOSIS — Z83438 Family history of other disorder of lipoprotein metabolism and other lipidemia: Secondary | ICD-10-CM

## 2022-09-04 DIAGNOSIS — G4733 Obstructive sleep apnea (adult) (pediatric): Secondary | ICD-10-CM | POA: Diagnosis present

## 2022-09-04 DIAGNOSIS — Z9989 Dependence on other enabling machines and devices: Secondary | ICD-10-CM | POA: Diagnosis not present

## 2022-09-04 DIAGNOSIS — Z8711 Personal history of peptic ulcer disease: Secondary | ICD-10-CM

## 2022-09-04 DIAGNOSIS — K31819 Angiodysplasia of stomach and duodenum without bleeding: Secondary | ICD-10-CM | POA: Diagnosis not present

## 2022-09-04 DIAGNOSIS — Z818 Family history of other mental and behavioral disorders: Secondary | ICD-10-CM

## 2022-09-04 DIAGNOSIS — Z8249 Family history of ischemic heart disease and other diseases of the circulatory system: Secondary | ICD-10-CM

## 2022-09-04 DIAGNOSIS — Z888 Allergy status to other drugs, medicaments and biological substances status: Secondary | ICD-10-CM

## 2022-09-04 DIAGNOSIS — K5520 Angiodysplasia of colon without hemorrhage: Secondary | ICD-10-CM | POA: Diagnosis not present

## 2022-09-04 DIAGNOSIS — Z96653 Presence of artificial knee joint, bilateral: Secondary | ICD-10-CM | POA: Diagnosis present

## 2022-09-04 LAB — CBC WITH DIFFERENTIAL/PLATELET
Abs Immature Granulocytes: 0.02 10*3/uL (ref 0.00–0.07)
Basophils Absolute: 0.1 10*3/uL (ref 0.0–0.1)
Basophils Relative: 1 %
Eosinophils Absolute: 0.4 10*3/uL (ref 0.0–0.5)
Eosinophils Relative: 4 %
HCT: 25.2 % — ABNORMAL LOW (ref 36.0–46.0)
Hemoglobin: 7.4 g/dL — ABNORMAL LOW (ref 12.0–15.0)
Immature Granulocytes: 0 %
Lymphocytes Relative: 16 %
Lymphs Abs: 1.4 10*3/uL (ref 0.7–4.0)
MCH: 19.1 pg — ABNORMAL LOW (ref 26.0–34.0)
MCHC: 29.4 g/dL — ABNORMAL LOW (ref 30.0–36.0)
MCV: 64.9 fL — ABNORMAL LOW (ref 80.0–100.0)
Monocytes Absolute: 0.9 10*3/uL (ref 0.1–1.0)
Monocytes Relative: 10 %
Neutro Abs: 5.9 10*3/uL (ref 1.7–7.7)
Neutrophils Relative %: 69 %
Platelets: 237 10*3/uL (ref 150–400)
RBC: 3.88 MIL/uL (ref 3.87–5.11)
RDW: 18.6 % — ABNORMAL HIGH (ref 11.5–15.5)
WBC: 8.5 10*3/uL (ref 4.0–10.5)
nRBC: 0.2 % (ref 0.0–0.2)

## 2022-09-04 LAB — BASIC METABOLIC PANEL
BUN: 12 (ref 4–21)
CO2: 29 — AB (ref 13–22)
Chloride: 100 (ref 99–108)
Creatinine: 1.1 (ref 0.5–1.1)
Glucose: 119
Potassium: 3.7 mEq/L (ref 3.5–5.1)
Sodium: 143 (ref 137–147)

## 2022-09-04 LAB — COMPREHENSIVE METABOLIC PANEL
ALT: 19 U/L (ref 0–44)
AST: 24 U/L (ref 15–41)
Albumin: 4 g/dL (ref 3.5–5.0)
Albumin: 4.5 (ref 3.5–5.0)
Alkaline Phosphatase: 78 U/L (ref 38–126)
Anion gap: 11 (ref 5–15)
BUN: 15 mg/dL (ref 8–23)
CO2: 29 mmol/L (ref 22–32)
Calcium: 9.4 mg/dL (ref 8.9–10.3)
Calcium: 9.7 (ref 8.7–10.7)
Chloride: 103 mmol/L (ref 98–111)
Creatinine, Ser: 1.01 mg/dL — ABNORMAL HIGH (ref 0.44–1.00)
GFR, Estimated: >60 mL/min (ref >60)
Glucose, Bld: 125 mg/dL — ABNORMAL HIGH (ref 70–99)
Potassium: 3.8 mmol/L (ref 3.5–5.1)
Sodium: 143 mmol/L (ref 135–145)
Total Bilirubin: 0.4 mg/dL (ref 0.3–1.2)
Total Protein: 7.9 g/dL (ref 6.5–8.1)
eGFR: 58

## 2022-09-04 LAB — PREPARE RBC (CROSSMATCH)

## 2022-09-04 LAB — PROTIME-INR
INR: 1.3 — ABNORMAL HIGH (ref 0.8–1.2)
Prothrombin Time: 15.8 seconds — ABNORMAL HIGH (ref 11.4–15.2)

## 2022-09-04 LAB — CBC AND DIFFERENTIAL: Hemoglobin: 7.7 — AB (ref 12.0–16.0)

## 2022-09-04 LAB — VITAMIN D 25 HYDROXY (VIT D DEFICIENCY, FRACTURES): Vit D, 25-Hydroxy: 37.9

## 2022-09-04 LAB — HEMOGLOBIN AND HEMATOCRIT, BLOOD
HCT: 24.1 % — ABNORMAL LOW (ref 36.0–46.0)
Hemoglobin: 6.9 g/dL — CL (ref 12.0–15.0)

## 2022-09-04 LAB — OCCULT BLOOD X 1 CARD TO LAB, STOOL: Fecal Occult Bld: NEGATIVE

## 2022-09-04 MED ORDER — PANTOPRAZOLE SODIUM 40 MG IV SOLR
40.0000 mg | Freq: Once | INTRAVENOUS | Status: AC
  Administered 2022-09-04: 40 mg via INTRAVENOUS
  Filled 2022-09-04: qty 10

## 2022-09-04 MED ORDER — ONDANSETRON HCL 4 MG PO TABS: 4.0000 mg | ORAL_TABLET | Freq: Four times a day (QID) | ORAL | Status: DC | PRN

## 2022-09-04 MED ORDER — INSULIN ASPART 100 UNIT/ML IJ SOLN
0.0000 [IU] | INTRAMUSCULAR | Status: DC
  Administered 2022-09-05: 2 [IU] via SUBCUTANEOUS
  Administered 2022-09-05: 3 [IU] via SUBCUTANEOUS
  Administered 2022-09-05: 2 [IU] via SUBCUTANEOUS
  Administered 2022-09-05: 3 [IU] via SUBCUTANEOUS

## 2022-09-04 MED ORDER — ALBUTEROL SULFATE (2.5 MG/3ML) 0.083% IN NEBU
2.5000 mg | INHALATION_SOLUTION | Freq: Four times a day (QID) | RESPIRATORY_TRACT | Status: DC
  Administered 2022-09-05 (×3): 2.5 mg via RESPIRATORY_TRACT
  Filled 2022-09-04 (×3): qty 3

## 2022-09-04 MED ORDER — SODIUM CHLORIDE 0.9 % IV SOLN: INTRAVENOUS | Status: AC

## 2022-09-04 MED ORDER — PANTOPRAZOLE SODIUM 40 MG IV SOLR
40.0000 mg | Freq: Two times a day (BID) | INTRAVENOUS | Status: DC
  Administered 2022-09-04 – 2022-09-05 (×2): 40 mg via INTRAVENOUS
  Filled 2022-09-04 (×2): qty 10

## 2022-09-04 MED ORDER — SODIUM CHLORIDE 0.9% IV SOLUTION: Freq: Once | INTRAVENOUS | Status: AC

## 2022-09-04 MED ORDER — ONDANSETRON HCL 4 MG/2ML IJ SOLN: 4.0000 mg | Freq: Four times a day (QID) | INTRAMUSCULAR | Status: DC | PRN

## 2022-09-04 NOTE — Telephone Encounter (Signed)
Spoke w/ Dr. Nani Ravens- instructed to tell Pt to go to ED.    Spoke w/ Pt- informed her of recommendations. She will proceed to either [facility] or [facility].

## 2022-09-04 NOTE — ED Provider Triage Note (Signed)
Emergency Medicine Provider Triage Evaluation Note  Lynn [name] , a 66 y.o. female  was evaluated in triage.  Pt complains of low hemoglobin, dark stool.  Patient was seen by her nephrologist earlier today with outpatient labs drawn with a hemoglobin around 7 and was told to come the emergency department for further evaluation.  She is currently complaining of some shortness of breath as well as dizziness/lightheadedness.  She reports having to increase her active home oxygen from 3 to 4 L nasal cannula given her shortness of breath.  She reports diarrhea with melanotic appearance for the past 2 weeks.  She is on Eliquis for paroxysmal atrial fibrillation.  Reports history of gastric/esophageal ulcers  Review of Systems  Positive: See above Negative:   Physical Exam  BP 127/69 (BP Location: Left Arm)   Pulse 78   Temp 98.7 F (37.1 C) (Oral)   Resp 18   LMP  (LMP Unknown)   SpO2 100%  Gen:   Awake, no distress   Resp:  Normal effort  MSK:   Moves extremities without difficulty  Other:    Medical Decision Making  Medically screening exam initiated at 2:47 PM.  Appropriate orders placed.  Leiann Sporer was informed that the remainder of the evaluation will be completed by another provider, this initial triage assessment does not replace that evaluation, and the importance of remaining in the ED until their evaluation is complete.    Wilnette Kales, [place] 09/04/22 1454

## 2022-09-04 NOTE — ED Provider Notes (Signed)
[place] DEPT Provider Note   CSN: [id number] Arrival date & time: 09/04/22  1347     History  Chief Complaint  Patient presents with  . abnormal labs    Lynn [name] is a 66 y.o. female.  HPI   66 year old female presents emergency department with complaints of low hemoglobin, dark stool.  Patient was seen by her nephrologist earlier today with outpatient labs drawn with a hemoglobin around 7 and was told to come the emergency department for further evaluation.  She is currently complaining of some shortness of breath as well as dizziness/lightheadedness.  She reports having to increase her active home oxygen from 3 to 4 L nasal cannula given her shortness of breath.  She reports diarrhea with melanotic appearance for the past 2 weeks.  She is on Eliquis for paroxysmal atrial fibrillation.  Reports history of gastric/esophageal ulcers.  denies fever, chills, night sweats, chest pain, nausea, vomiting, urinary symptoms.  Last dose of Eliquis around 10-10 30 this morning.  Past medical history significant for paroxysmal atrial fibrillation on Eliquis, diabetes mellitus, rheumatoid arthritis, gastric ulcers, esophageal ulcers, CAD, hyperlipidemia, hypertension, CHF, interstitial lung disease  Home Medications Prior to Admission medications   Medication Sig Start Date End Date Taking? Authorizing Provider  acetaminophen (TYLENOL) 500 MG tablet Take 500 mg by mouth every 6 (six) hours as needed for fever.    [provider]  albuterol (VENTOLIN HFA) 108 (90 Base) MCG/ACT inhaler Inhale 2 puffs into the lungs every 6 (six) hours as needed for wheezing or shortness of breath. 04/03/21   Mercy Riding, MD  apixaban (ELIQUIS) 5 MG TABS tablet Take 1 tablet (5 mg total) by mouth 2 (two) times daily. 10/27/21   Leonie Man, MD  atorvastatin (LIPITOR) 80 MG tablet Take 1 tablet (80 mg total) by mouth every evening. 08/01/22   Copland, Gay Filler, MD  cetirizine (ZYRTEC) 10 MG tablet Take 10 mg by mouth daily as needed for allergies.    [provider]  Continuous Blood Gluc Sensor (DEXCOM G6 SENSOR) MISC 1 Device by Does not apply route as directed. 09/11/21   [name], Melanie Crazier, MD  Continuous Blood Gluc Transmit (DEXCOM G6 TRANSMITTER) MISC 1 Device by Does not apply route as directed. 09/11/21   [name], Melanie Crazier, MD  diltiazem (CARDIZEM CD) 360 MG 24 hr capsule TAKE 1 CAPSULE BY MOUTH EVERY DAY 05/30/22   Leonie Man, MD  folic acid (FOLVITE) 1 MG tablet Take 2 tablets (2 mg total) by mouth daily. Patient taking differently: Take 1 mg by mouth in the morning and at bedtime. 01/21/20   Copland, Gay Filler, MD  furosemide (LASIX) 20 MG tablet Decrease Lasix ( furosemide)  to 20 mg ( 1/2/tablet)  , with 20 mg  you may take a an additional 20 mg if need for swelling or weight gain more than 3 lbs overnight or 5 lbs in one week -- if you have continue to take an additional 20 mg , go back to 40 mg  Take additional dose as needed for egt gain> 3 lb in 1 day 11/15/21   Shawna Clamp, MD  furosemide (LASIX) 40 MG tablet PLEASE SEE ATTACHED FOR DETAILED DIRECTIONS 06/20/22   Leonie Man, MD  glucose blood (CONTOUR NEXT TEST) test strip 3x daily 07/20/19   [name], Melanie Crazier, MD  hydroxychloroquine (PLAQUENIL) 200 MG tablet Take 200 mg by mouth 2 (two) times daily. 05/30/22   [provider]  Insulin Disposable Pump (OMNIPOD 5 G6 INTRO, GEN 5,) KIT 1 Device by Does not apply route every other day. 04/13/22   [name], Melanie Crazier, MD  Insulin Disposable Pump (OMNIPOD 5 G6 POD, GEN 5,) MISC 1 Device by Does not apply route every other day. 08/13/22   [name], Melanie Crazier, MD  insulin lispro (HUMALOG) 100 UNIT/ML injection Max Daily 100 units per pump 04/12/22   [name], Melanie Crazier, MD  Insulin Pen Needle (B-D UF III MINI PEN NEEDLES) 31G X 5 MM MISC USE DAILY WITH VICTOZA AND  LANTUS. Patient taking differently: Use for ozempic 07/20/19   [name], Melanie Crazier, MD  Insulin Pen Needle 31G X 5 MM MISC 1 Device by Does not apply route 3 (three) times daily. 01/21/20   Copland, Gay Filler, MD  Lancets 30G MISC 1 Device by Does not apply route 3 (three) times daily. 07/16/19   Copland, Gay Filler, MD  leflunomide (ARAVA) 20 MG tablet Take 20 mg by mouth daily. 08/16/21   [provider]  losartan (COZAAR) 100 MG tablet 1 tablet Orally Once a day    [provider]  Melatonin 5 MG CHEW Chew 5 mg by mouth at bedtime.    [provider]  Multiple Vitamin (MULTIVITAMIN WITH MINERALS) TABS tablet Take 1 tablet by mouth daily.    [provider]  Oxymetazoline HCl (VICKS SINEX NA) Place 1 spray into the nose daily as needed (congestion).    [provider]  pantoprazole (PROTONIX) 40 MG tablet TAKE 1 TABLET BY MOUTH TWICE A DAY BEFORE MEALS 06/20/22   Ladene Artist, MD  SAFETY-LOK TB SYRINGE 27GX.5" 27G X 1/2" 1 ML MISC  08/18/19   [provider]  Semaglutide, 2 MG/DOSE, 8 MG/3ML SOPN Inject 2 mg as directed once a week. 04/12/22   [name], Melanie Crazier, MD  tirzepatide [facility]) 5 MG/0.5ML Pen Inject 5 mg into the skin once a week. 08/27/22   [name], Melanie Crazier, MD  traZODone (DESYREL) 50 MG tablet Take 0.5-1 tablets (25-50 mg total) by mouth at bedtime as needed. for sleep 08/01/22   Copland, Gay Filler, MD      Allergies    Methotrexate, Penicillins, Farxiga [dapagliflozin], Metformin and related, and Milk [milk (cow)]    Review of Systems   Review of Systems  All other systems reviewed and are negative.   Physical Exam Updated Vital Signs BP 128/78   Pulse 66   Temp 98.7 F (37.1 C)   Resp 18   Ht 5' 3" (1.6 m)   Wt 109.8 kg   LMP  (LMP Unknown)   SpO2 99%   BMI 42.87 kg/m  Physical Exam Vitals and nursing note reviewed.  Constitutional:      General: She is not in acute distress.     Appearance: She is well-developed.  HENT:     Head: Normocephalic and atraumatic.  Eyes:     Conjunctiva/sclera: Conjunctivae normal.  Cardiovascular:     Rate and Rhythm: Normal rate and regular rhythm.     Heart sounds: No murmur heard. Pulmonary:     Effort: Pulmonary effort is normal. No respiratory distress.     Breath sounds: Normal breath sounds.  Abdominal:     Palpations: Abdomen is soft.     Tenderness: There is no abdominal tenderness.  Genitourinary:    Comments: Digital rectal exam showed no gross bright red blood.  Scant amount of stool obtained difficult to assess whether truly  melanotic or not.  Sample sent for Hemoccult for assessment. Musculoskeletal:        General: No swelling.     Cervical back: Neck supple.     Right lower leg: No edema.     Left lower leg: No edema.  Skin:    General: Skin is warm and dry.     Capillary Refill: Capillary refill takes less than 2 seconds.  Neurological:     Mental Status: She is alert.  Psychiatric:        Mood and Affect: Mood normal.     ED Results / Procedures / Treatments   Labs (all labs ordered are listed, but only abnormal results are displayed) Labs Reviewed  COMPREHENSIVE METABOLIC PANEL - Abnormal; Notable for the following components:      Result Value   Glucose, Bld 125 (*)    Creatinine, Ser 1.01 (*)    All other components within normal limits  CBC WITH DIFFERENTIAL/PLATELET - Abnormal; Notable for the following components:   Hemoglobin 7.4 (*)    HCT 25.2 (*)    MCV 64.9 (*)    MCH 19.1 (*)    MCHC 29.4 (*)    RDW 18.6 (*)    All other components within normal limits  PROTIME-INR - Abnormal; Notable for the following components:   Prothrombin Time 15.8 (*)    INR 1.3 (*)    All other components within normal limits  OCCULT BLOOD X 1 CARD TO LAB, STOOL  URINALYSIS, ROUTINE W REFLEX MICROSCOPIC  HEMOGLOBIN AND HEMATOCRIT, BLOOD  TYPE AND SCREEN  PREPARE RBC (CROSSMATCH)     EKG None  Radiology DG Chest Port 1 View  Result Date: 09/04/2022 CLINICAL DATA:  Shortness of breath EXAM: PORTABLE CHEST 1 VIEW COMPARISON:  CXR 11/14/21, CT Chest 06/23/21 FINDINGS: No pleural effusion. No pneumothorax. Normal cardiac and mediastinal contours. No focal airspace opacity. No displaced rib fractures. The upper abdomen is poorly visualized. IMPRESSION: No focal airspace opacity. Electronically Signed   By: Marin Roberts M.D.   On: 09/04/2022 17:15    Procedures .Critical Care  Performed by: Wilnette Kales, PA Authorized by: Wilnette Kales, PA   Critical care provider statement:    Critical care time (minutes):  45   Critical care was necessary to treat or prevent imminent or life-threatening deterioration of the following conditions:  Circulatory failure   Critical care was time spent personally by me on the following activities:  Development of treatment plan with patient or surrogate, discussions with consultants, evaluation of patient's response to treatment, examination of patient, ordering and review of laboratory studies, ordering and review of radiographic studies, ordering and performing treatments and interventions, pulse oximetry, re-evaluation of patient's condition and review of old charts   I assumed direction of critical care for this patient from another provider in my specialty: no     Care discussed with: admitting provider       Medications Ordered in ED Medications  0.9 %  sodium chloride infusion (Manually program via Guardrails IV Fluids) (has no administration in time range)  pantoprazole (PROTONIX) injection 40 mg (has no administration in time range)  pantoprazole (PROTONIX) injection 40 mg (40 mg Intravenous Given 09/04/22 1640)    ED Course/ Medical Decision Making/ A&P Clinical Course as of 09/04/22 1938  Tue Sep 04, 2022  [place] gastro[facility]ology Dr. Lyndel Safe regarding the patient.  He agreed with IV administration of Protonix,  administration of blood admission to the hospital.  Keep n.p.o. after midnight for morning team's evaluation. [CR]  1854 Consulted hospital medicine Dr. Marcello Moores.  She agreed with admission of the patient assume further treatment/care. [CR]    Clinical Course User Index [CR] Wilnette Kales, PA                           Medical Decision Making Amount and/or Complexity of Data Reviewed Labs: ordered. Radiology: ordered.  Risk Prescription drug management. Decision regarding hospitalization.   This patient presents to the ED for concern of low hemoglobin, this involves an extensive number of treatment options, and is a complaint that carries with it a high risk of complications and morbidity.  The differential diagnosis includes upper GI/lower GI bleed, hemorrhoidal bleeding, exsanguination, symptomatic anemia   Co morbidities that complicate the patient evaluation  See HPI   Additional history obtained:  Additional history obtained from EMR External records from outside source obtained and reviewed including hospital records   Lab Tests:  I Ordered, and personally interpreted labs.  The pertinent results include: No leukocytosis.  Evidence of anemia with a hemoglobin of 7.4 which is dropped from patient's baseline around 8.9 and 9.3.  Platelets within normal range.  No electrolyte abnormalities noted.  Slight bump in creatinine of 1.01 but otherwise renal function within normal limits.  No transaminitis noted.  PT/INR elevated with a prothrombin of 15.8 and direct 1.3.  Hemoccult blood negative.  Urinalysis pending   Imaging Studies ordered:  I ordered imaging studies including chest x-ray I independently visualized and interpreted imaging which showed no acute cardiopulmonary abnormality I agree with the radiologist interpretation   Cardiac Monitoring: / EKG:  The patient was maintained on a cardiac monitor.  I personally viewed and interpreted the cardiac monitored which  showed an underlying rhythm of: Sinus rhythm with no acute ischemic changes   Consultations Obtained:  See ED course  Problem List / ED Course / Critical interventions / Medication management  Symptomatic anemia I ordered medication including IV Protonix Reevaluation of the patient after these medicines showed that the patient improved I have reviewed the patients home medicines and have made adjustments as needed   Social Determinants of Health:  Denies tobacco, illicit drug use.   Test / Admission - Considered:  Symptomatic anemia Vitals signs within normal range and stable throughout visit. Laboratory/imaging studies significant for: See above Patient deemed to meet admission criteria given evidence of symptomatic anemia with new drop in hemoglobin of 1.5 to evaluate 7.4.  Patient given 1 unit of packed red blood cells while emergency department as well as begun on IV Protonix given presumed source of upper GI given history of self-reported melena for the past 2 weeks.  Proper consultations were made while emergency department.  Treatment plan discussed with patient she knowledge understanding was agreeable for admission to the hospital.  Patient stable for admission to hospital.         Final Clinical Impression(s) / ED Diagnoses Final diagnoses:  Symptomatic anemia    Rx / DC Orders ED Discharge Orders     None         Wilnette Kales, [place] 09/04/22 Verdis Prime, MD 09/04/22 2328

## 2022-09-04 NOTE — H&P (Signed)
History and Physical    Lynn [name] IBB:[id number] DOB: Jan 08, 1956 DOA: 09/04/2022  PCP: [name], MD  Patient coming from:  home  I have personally briefly reviewed patient's old medical records in [place]  Chief Complaint: low hgb 7 / symptomatic anemia  HPI: Lynn [name] is a 66 y.o. female with medical history significant of anxiety, non-obstructive CAD , chronic diastolic Chf with mital valuve steonsis and calcified mitral valve mass,2DM, HTN, dyslipidemia,thalassemia, OSA on CPAP . RA , ILD due MTX, COPD on 3L chronic O2, Afib on eliquis, hx of PUD with gi bleed in the recent past, IDA who presents in referral from clinic after she was found to have drop in her hgb from 8.9 to 7.4 in setting of dark stools. Patient states she 1st noted dark stools 2 weeks ago. She states since then dark stools has been persistent. She also states over the last few days she has had increase frequency of stools, noting around 7 dark stools per day. She also notes associated fatigue, sob, palpitations and fleeting chest discomfort. She also noted  vague abdominal pain as well.  She states her last dose of eliquis was this am.    ED Course:  Afeb, bp 127/69, hr 78, rr 18, sat 100%  Fob (neg? )  Labs NA 143, K 3.8, cl 103, glu 125 , cr 1.01 plt , ast 24, alt 19,  gly 125 , inr 1.3  Hgb: 7/4, prior 8.9  ( 06/07/22) wbc 8.5,  PPI: 40 mg iv  Cxr: no focal airspace opacity  Tx 1 units prbc , 40 mg iv protonix  Case discussed with GI dr Lyndel Safe who rec :ppi/ npo for possible scope in am  Review of Systems: As per HPI otherwise 10 point review of systems negative.   Past Medical History:  Diagnosis Date   Anxiety    Clotting disorder ([place])    blood clot in eye 2016   Coronary artery disease, non-occlusive    a. 11/2016 NSTEMI/Cath: LM nl, LAD 40p, 37m, D1/2 small, LCX 40ost, OM2/3 nl/small, RCA 35 ost/mid, RPDA/RPL small/nl.   Coronary vasospasm (HCC)    Depression     Diastolic dysfunction    a. 11/2016 Echo: EF 55-60%, gr1 DD, sev calcified MV annulus, mildly dil LA.   Eye hemorrhage 01/2015   "right; resolved" (07/03/2017)   Headache    "monthly" (07/03/2017)   History of blood transfusion    "when I had an ectopic pregnancy"   History of stomach ulcers    Hyperlipidemia    Hypertension    Microcytic anemia    Moderate mitral stenosis by prior echocardiogram 03/2021   TTE: Mod MS (mean gradient 9 mmHg). -- TEE Moderate-Severe MS (mean gradient 14 mmHg) with fixed Post MV Leaflet & Severe MAC w/ large circumscribed calcified mass on the Post MV Leaflet. (Mass previously described in 2018) - CMRI identifies calcified mass & not Tumor.; R&LHC - LVEDP & PCWP both 23 mmHg indicating minimal resting gradient   OSA on CPAP    setting is unknown   PAF ([place]oxysmal [place] fibrillation) [place] 03/2021   Event Monitor Aug-Sept 2022: Predominantly sinus rhythm.  1% A. fib burden.  30 brief episodes of PAT.  2 short bursts of NSVT.   PAT (paroxysmal atrial tachycardia) [place]    Event monitor from August 2022 showed 30 brief bursts longest 14 seconds.   Pneumonia    "several times" (07/03/2017)   PVC's (premature ventricular  contractions)    Rheumatoid arthritis ([place])    Syncope 07/03/2017 X 2   called seizures but no medications   Thalassemia    "my cells are sickle cell shaped but I don't have sickle cell anemia" (07/03/2017)   Type II diabetes mellitus ([place])     Past Surgical History:  Procedure Laterality Date   48-Hour Monitor  03/18/2017   Sinus rhythm with sinus tachycardia (rate 58-134 BPM)multiple PVCs noted with couplets and bigeminy. One triplet. 7 runs of PAT ranging from 100-130 bpm. Longest was 33 beats.   BIOPSY  04/03/2021   Procedure: BIOPSY;  Surgeon: Yetta Flock, MD;  Location: [facility];  Service: Gastroenterology;;   BREAST BIOPSY Left    BRONCHIAL WASHINGS  03/28/2021   Procedure: BRONCHIAL WASHINGS;  Surgeon: Julian Hy, DO;  Location: [facility];  Service: Endoscopy;;   CARDIAC CATHETERIZATION N/A 11/19/2016   Procedure: Left Heart Cath and Coronary Angiography;  Surgeon: Belva Crome, MD;  Location: [facility];  Service: Cardiovascular.    LM nl, LAD 40p, 37m, D1/2 small, LCX 40ost, OM2/3 nl/small, RCA 35 ost/mid, RPDA/RPL small/nl.   CARDIAC MRI  03/30/2021   Normal LVEF ~53%. 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 No Scar or Late Gadolinium Enhancement on LV Myocardium.   CARPAL TUNNEL RELEASE Right 11/01/2014   COLONOSCOPY     DILATION AND CURETTAGE OF UTERUS     ECTOPIC PREGNANCY SURGERY  X 2   ESOPHAGOGASTRODUODENOSCOPY (EGD) WITH PROPOFOL N/A 04/03/2021   Procedure: ESOPHAGOGASTRODUODENOSCOPY (EGD) WITH PROPOFOL;  Surgeon: AYetta Flock MD;  Location: [facility];  Service: Gastroenterology;  Laterality: N/A;   ESOPHAGOGASTRODUODENOSCOPY (EGD) WITH PROPOFOL N/A 08/15/2021   Procedure: ESOPHAGOGASTRODUODENOSCOPY (EGD) WITH PROPOFOL;  Surgeon: SLadene Artist MD;  Location: [facility];  Service: Endoscopy;  Laterality: N/A;   JOINT REPLACEMENT     KNEE ARTHROSCOPY Left 11/2014   RIGHT/LEFT HEART CATH AND CORONARY ANGIOGRAPHY N/A 08/08/2021   Procedure: RIGHT/LEFT HEART CATH AND CORONARY ANGIOGRAPHY;  Surgeon: HLeonie Man MD;  [facility];  Service: Cardiovascular;Ost LCx 40%. Ost-Prox RCA 35%. Prox-Mid LAD 25%-<25% D2. Mod-Severely Elevated LVEDP. Mild PA HTN -> PA mean 31 mmHg with a PCWP and LVEDP of 23 mmHg   TEE WITHOUT CARDIOVERSION N/A 03/28/2021   Procedure: TRANSESOPHAGEAL ECHOCARDIOGRAM (TEE);  Surgeon: NThayer Headings MD;  [facility];; LVEF 55-60%. Normal LV Fxn & no RWMA. No LAA thrombus. Gr II Ao Plaque. Large circumscribed calcific mass (1.8 x 1.4 cm) anterior to posterior annulus.  Fixed posterior leaflet with Mod-Severe MS (Mean MVG ~14 mmHg, Peak 20 mmhg).  Mild MR   TONSILLECTOMY      TOTAL KNEE ARTHROPLASTY Right 02/18/2015   Procedure: RIGHT TOTAL KNEE ARTHROPLASTY;  Surgeon: SNetta Cedars MD;  [place] [facility]  Service: Orthopedics;  Laterality: Right;   TOTAL KNEE ARTHROPLASTY Left 08/19/2015   Procedure: LEFT TOTAL KNEE ARTHROPLASTY;  Surgeon: SNetta Cedars MD;  [place] [facility]  Service: Orthopedics;  Laterality: Left;   TRANSTHORACIC ECHOCARDIOGRAM  11/2016; 07/2017:   a) normal LV size, thickness and function. EF 55-60%. GR 1 DD.No RWMA severe mitral annular calcification, but no mitral stenosis. Mild LA dilation;; b) normal LV size and function.  EF 65-75%.  Unable to assess diastolic function.  Aortic sclerosis with no stenosis.  Moderate mitral stenosis? (Gradient 9 mmHg) -?  Mild-mod left atrial enlargement    TRANSTHORACIC ECHOCARDIOGRAM  01/2019  EF 65 to 70%.  Normal function.  Elevated LVEDP-GR 1 DD.  Normal RV size and function.  Large calcific mass on posterior mitral leaflet mild to moderate mitral stenosis.   TRANSTHORACIC ECHOCARDIOGRAM  03/24/2021   EF 60 to 65%.  LV function with normal wall motion.  Moderate basal septal LVH.  GRII DD & Mild LA dilation.-> elevated LVEDP.  Normal RV function.  Mildly elevated PAP.  Ill-defined density measuring 3.23 x 2.1 cm region of the posterior MV leaflet along with dense MAC.  Moderate MS (mean MVG of 9 mmHg.) Normal AoV & RAP. --> Recommend TEE 2/2 concern for MV SBE.   TRANSTHORACIC ECHOCARDIOGRAM  04/26/2022   EF 60 to 65%.  No RWMA.  Moderate LVH with GR 1 DD.  Normal RV size and function.  Mild LA dilation.  Large calcific mass of posterior MV leaflet.  Mean PG 13 mm 3.  Trivial MR.  Mild to moderate MS.  Severe MAC.  Normal AOV.  Normal RAP.   VAGINAL HYSTERECTOMY     VIDEO BRONCHOSCOPY N/A 03/28/2021   Procedure: VIDEO BRONCHOSCOPY WITHOUT FLUORO;  Surgeon: Julian Hy, DO;  Location[facility];  Service: Endoscopy;  Laterality: N/A;     reports that she quit smoking about 7 years ago. Her smoking use  included cigarettes. She has a 11.00 pack-year smoking history. She has never used smokeless tobacco. She reports that she does not currently use alcohol. She reports that she does not use drugs.  Allergies  Allergen Reactions   Methotrexate Other (See Comments)    Pneumonitis   Penicillins Hives    Childhood allergy Has patient had a PCN reaction causing immediate rash, facial/tongue/throat swelling, SOB or lightheadedness with hypotension: Yes Has patient had a PCN reaction causing severe rash involving mucus membranes or skin necrosis: No Has patient had a PCN reaction that required hospitalization No Has patient had a PCN reaction occurring within the last 10 years: No If all of the above answers are "NO", then may proceed with Cephalosporin use.   [name] [Dapagliflozin] Other (See Comments)    Dizzy and lethargic    Metformin And Related Other (See Comments)    Diarrhea, bleeding   Milk [Milk (Cow)] Diarrhea    Milk and dairy products    Family History  Problem Relation Age of Onset   Cancer Mother    Diabetes Mother    Hypertension Mother    Hyperlipidemia Mother    Cancer Father    Hyperlipidemia Father    Mental illness Sister    Diabetes Sister    Diabetes Maternal Grandmother    Diabetes Maternal Grandfather    Colon cancer Neg Hx    Esophageal cancer Neg Hx    Stomach cancer Neg Hx    Rectal cancer Neg Hx    Tremor Neg Hx    Parkinson's disease Neg Hx     Prior to Admission medications   Medication Sig Start Date End Date Taking? Authorizing Provider  acetaminophen (TYLENOL) 500 MG tablet Take 500 mg by mouth every 6 (six) hours as needed for fever.    [provider]  albuterol (VENTOLIN HFA) 108 (90 Base) MCG/ACT inhaler Inhale 2 puffs into the lungs every 6 (six) hours as needed for wheezing or shortness of breath. 04/03/21   Mercy Riding, MD  apixaban (ELIQUIS) 5 MG TABS tablet Take 1 tablet (5 mg total) by mouth 2 (two) times daily. 10/27/21    Leonie Man, MD  atorvastatin (LIPITOR) 80 MG tablet Take 1 tablet (80 mg total) by mouth every evening. 08/01/22   Copland, Gay Filler, MD  cetirizine (ZYRTEC) 10 MG tablet Take 10 mg by mouth daily as needed for allergies.    [provider]  Continuous Blood Gluc Sensor (DEXCOM G6 SENSOR) MISC 1 Device by Does not apply route as directed. 09/11/21   [name], Melanie Crazier, MD  Continuous Blood Gluc Transmit (DEXCOM G6 TRANSMITTER) MISC 1 Device by Does not apply route as directed. 09/11/21   [name], Melanie Crazier, MD  diltiazem (CARDIZEM CD) 360 MG 24 hr capsule TAKE 1 CAPSULE BY MOUTH EVERY DAY 05/30/22   Leonie Man, MD  folic acid (FOLVITE) 1 MG tablet Take 2 tablets (2 mg total) by mouth daily. Patient taking differently: Take 1 mg by mouth in the morning and at bedtime. 01/21/20   Copland, Gay Filler, MD  furosemide (LASIX) 20 MG tablet Decrease Lasix ( furosemide)  to 20 mg ( 1/2/tablet)  , with 20 mg  you may take a an additional 20 mg if need for swelling or weight gain more than 3 lbs overnight or 5 lbs in one week -- if you have continue to take an additional 20 mg , go back to 40 mg  Take additional dose as needed for egt gain> 3 lb in 1 day 11/15/21   Shawna Clamp, MD  furosemide (LASIX) 40 MG tablet PLEASE SEE ATTACHED FOR DETAILED DIRECTIONS 06/20/22   Leonie Man, MD  glucose blood (CONTOUR NEXT TEST) test strip 3x daily 07/20/19   [name], Melanie Crazier, MD  hydroxychloroquine (PLAQUENIL) 200 MG tablet Take 200 mg by mouth 2 (two) times daily. 05/30/22   [provider]  Insulin Disposable Pump (OMNIPOD 5 G6 INTRO, GEN 5,) KIT 1 Device by Does not apply route every other day. 04/13/22   [name], Melanie Crazier, MD  Insulin Disposable Pump (OMNIPOD 5 G6 POD, GEN 5,) MISC 1 Device by Does not apply route every other day. 08/13/22   [name], Melanie Crazier, MD  insulin lispro (HUMALOG) 100 UNIT/ML injection Max Daily 100 units per pump  04/12/22   [name], Melanie Crazier, MD  Insulin Pen Needle (B-D UF III MINI PEN NEEDLES) 31G X 5 MM MISC USE DAILY WITH VICTOZA AND LANTUS. Patient taking differently: Use for ozempic 07/20/19   [name], Melanie Crazier, MD  Insulin Pen Needle 31G X 5 MM MISC 1 Device by Does not apply route 3 (three) times daily. 01/21/20   Copland, Gay Filler, MD  Lancets 30G MISC 1 Device by Does not apply route 3 (three) times daily. 07/16/19   Copland, Gay Filler, MD  leflunomide (ARAVA) 20 MG tablet Take 20 mg by mouth daily. 08/16/21   [provider]  losartan (COZAAR) 100 MG tablet 1 tablet Orally Once a day    [provider]  Melatonin 5 MG CHEW Chew 5 mg by mouth at bedtime.    [provider]  Multiple Vitamin (MULTIVITAMIN WITH MINERALS) TABS tablet Take 1 tablet by mouth daily.    [provider]  Oxymetazoline HCl (VICKS SINEX NA) Place 1 spray into the nose daily as needed (congestion).    [provider]  pantoprazole (PROTONIX) 40 MG tablet TAKE 1 TABLET BY MOUTH TWICE A DAY BEFORE MEALS 06/20/22   Ladene Artist, MD  SAFETY-LOK TB SYRINGE 27GX.5" 27G X 1/2" 1 ML MISC  08/18/19   [provider]  Semaglutide, 2 MG/DOSE, 8 MG/3ML SOPN  Inject 2 mg as directed once a week. 04/12/22   [name], Melanie Crazier, MD  tirzepatide [facility] 5 MG/0.5ML Pen Inject 5 mg into the skin once a week. 08/27/22   [name], Melanie Crazier, MD  traZODone (DESYREL) 50 MG tablet Take 0.5-1 tablets (25-50 mg total) by mouth at bedtime as needed. for sleep 08/01/22   [name], MD    Physical Exam: Vitals:   09/04/22 1615 09/04/22 1645 09/04/22 1735 09/04/22 1845  BP: (!) 145/69 (!) 145/69 127/77 128/78  Pulse: 71 71 68 66  Resp: _0 Temp:    98.7 F (37.1 C)  TempSrc:      SpO2: 100% 100% 100% 99%  Weight:      Height:        Constitutional: NAD, calm, comfortable Vitals:   09/04/22 1615 09/04/22 1645 09/04/22 1735 09/04/22 1845   BP: (!) 145/69 (!) 145/69 127/77 128/78  Pulse: 71 71 68 66  Resp: _1 Temp:    98.7 F (37.1 C)  TempSrc:      SpO2: 100% 100% 100% 99%  Weight:      Height:       Eyes: PERRL, lids and conjunctivae normal ENMT: Mucous membranes are moist. Posterior pharynx clear of any exudate or lesions.Normal dentition.  Neck: normal, supple, no masses, no thyromegaly Respiratory: clear to auscultation bilaterally, no wheezing, no crackles. Normal respiratory effort. No accessory muscle use.  Cardiovascular: Regular rate and rhythm, no murmurs / rubs / gallops. No extremity edema. + pedal pulses.  Abdomen: left lower quad tenderness, no masses palpated. No hepatosplenomegaly. Bowel sounds positive.  Musculoskeletal: no clubbing / cyanosis. No joint deformity upper and lower extremities. Good ROM, no contractures. Normal muscle tone.  Skin: no rashes, lesions, ulcers. No induration Neurologic: CN 2-12 grossly intact. Sensation intact, Strength 5/5 in all 4.  Psychiatric: Normal judgment and insight. Alert and oriented x 3. Normal mood.    Labs on Admission: I have personally reviewed following labs and imaging studies  CBC: Recent Labs  Lab 09/04/22 1514  WBC 8.5  NEUTROABS 5.9  HGB 7.4*  HCT 25.2*  MCV 64.9*  PLT 917   Basic Metabolic Panel: Recent Labs  Lab 09/04/22 1514  NA 143  K 3.8  CL 103  CO2 29  GLUCOSE 125*  BUN 15  CREATININE 1.01*  CALCIUM 9.4   GFR: Estimated Creatinine Clearance: 63.5 mL/min (A) (by C-G formula based on SCr of 1.01 mg/dL (H)). Liver Function Tests: Recent Labs  Lab 09/04/22 1514  AST 24  ALT 19  ALKPHOS 78  BILITOT 0.4  PROT 7.9  ALBUMIN 4.0   No results for input(s): "LIPASE", "AMYLASE" in the last 168 hours. No results for input(s): "AMMONIA" in the last 168 hours. Coagulation Profile: Recent Labs  Lab 09/04/22 1514  INR 1.3*   Cardiac Enzymes: No results for input(s): "CKTOTAL", "CKMB", "CKMBINDEX", "TROPONINI" in  the last 168 hours. BNP (last 3 results) No results for input(s): "PROBNP" in the last 8760 hours. HbA1C: No results for input(s): "HGBA1C" in the last 72 hours. CBG: No results for input(s): "GLUCAP" in the last 168 hours. Lipid Profile: No results for input(s): "CHOL", "HDL", "LDLCALC", "TRIG", "CHOLHDL", "LDLDIRECT" in the last 72 hours. Thyroid Function Tests: No results for input(s): "TSH", "T4TOTAL", "FREET4", "T3FREE", "THYROIDAB" in the last 72 hours. Anemia Panel: No results for input(s): "VITAMINB12", "FOLATE", "FERRITIN", "TIBC", "IRON", "RETICCTPCT" in the last 72 hours. Urine analysis:  Component Value Date/Time   COLORURINE YELLOW 03/23/2021 1732   APPEARANCEUR HAZY (A) 03/23/2021 1732   LABSPEC 1.017 03/23/2021 1732   PHURINE 5.0 03/23/2021 1732   GLUCOSEU NEGATIVE 03/23/2021 1732   HGBUR SMALL (A) 03/23/2021 1732   BILIRUBINUR NEGATIVE 03/23/2021 1732   KETONESUR NEGATIVE 03/23/2021 1732   PROTEINUR NEGATIVE 03/23/2021 1732   UROBILINOGEN 0.2 08/18/2008 1535   NITRITE NEGATIVE 03/23/2021 1732   LEUKOCYTESUR TRACE (A) 03/23/2021 1732    Radiological Exams on Admission: DG Chest Port 1 View  Result Date: 09/04/2022 CLINICAL DATA:  Shortness of breath EXAM: PORTABLE CHEST 1 VIEW COMPARISON:  CXR 11/14/21, CT Chest 06/23/21 FINDINGS: No pleural effusion. No pneumothorax. Normal cardiac and mediastinal contours. No focal airspace opacity. No displaced rib fractures. The upper abdomen is poorly visualized. IMPRESSION: No focal airspace opacity. Electronically Signed   By: Marin Roberts M.D.   On: 09/04/2022 17:15    EKG: Independently reviewed.   Assessment/Plan  Acute GI bleed -in setting of Eliquis with symptomatic anemia -1 prbc in ED given  - ppi bid iv  - npo midnight  - f/u further gi consult recs ( Dr Lyndel Safe)  - gently ivfs overnight while npo  - hold eliquis - gi to leave final recs regarding when to restart   Symptomatic anemia  - hx of IDA /  thalassemia, -transfuse 1 PRBC  -monitor h/h , transfuse if <8   Hx of COPD /ILD due to MTX on chronic O2  - now on 4L with symptomatic anemia  -wean back to 3 as able  -prn nebs   RA -on arava  -resume once stable from gi standpoint  - no current flare   Non-obstructive CAD  Chronic diastolic CHF Mitral valve stenosis  -no acute issues  -chf appears well compensated  -continue home regimen -not on lasix ( hx of syncope in setting of diuretic use)   Hx of Afib  -hold Eliquis   CVA  - hold secondary ppx due to Gi Bleed   DMII -suspended pump -place on iss/ q4h fs    Essential HTN -hold oral bp medications currently in setting of gi bleed  -resume as able    HLD -resume statin     DVT prophylaxis: scd due to gi bleed Code Status: full Family Communication:   Notarianni,Lester (Spouse) [phone] ([place])  Disposition Plan: patient  expected to be admitted greater than 2 midnights  Consults called: DR Joan Flores Admission status: progressive   Clance Boll MD [facility]   If 7PM-7AM, please contact night-coverage [email or web address] Password [facility]  09/04/2022, 7:35 PM

## 2022-09-04 NOTE — ED Notes (Signed)
Blood bank called and checked on status of blood units.  Blood bank reports unit is ready at this time

## 2022-09-04 NOTE — ED Triage Notes (Signed)
Pt states she was told to come to the ER because her hemoglobin was 7. Pt states she has had black stools for the past [redacted] weeks along with abdominal pain that comes and goes.

## 2022-09-05 ENCOUNTER — Encounter (HOSPITAL_COMMUNITY)
Admission: EM | Disposition: A | Discharge status: Home / Self Care | Payer: Self-pay | Source: Home / Self Care | Attending: Internal Medicine

## 2022-09-05 ENCOUNTER — Inpatient Hospital Stay (HOSPITAL_COMMUNITY): Payer: Medicare Other | Admitting: Anesthesiology

## 2022-09-05 ENCOUNTER — Encounter (HOSPITAL_COMMUNITY): Payer: Self-pay | Admitting: Internal Medicine

## 2022-09-05 ENCOUNTER — Other Ambulatory Visit: Payer: Self-pay

## 2022-09-05 DIAGNOSIS — K2289 Other specified disease of esophagus: Secondary | ICD-10-CM

## 2022-09-05 DIAGNOSIS — E1169 Type 2 diabetes mellitus with other specified complication: Secondary | ICD-10-CM

## 2022-09-05 DIAGNOSIS — K449 Diaphragmatic hernia without obstruction or gangrene: Secondary | ICD-10-CM

## 2022-09-05 DIAGNOSIS — D509 Iron deficiency anemia, unspecified: Secondary | ICD-10-CM

## 2022-09-05 DIAGNOSIS — K31819 Angiodysplasia of stomach and duodenum without bleeding: Secondary | ICD-10-CM

## 2022-09-05 DIAGNOSIS — I10 Essential (primary) hypertension: Secondary | ICD-10-CM

## 2022-09-05 DIAGNOSIS — Z7901 Long term (current) use of anticoagulants: Secondary | ICD-10-CM

## 2022-09-05 DIAGNOSIS — K922 Gastrointestinal hemorrhage, unspecified: Secondary | ICD-10-CM | POA: Diagnosis not present

## 2022-09-05 DIAGNOSIS — D62 Acute posthemorrhagic anemia: Secondary | ICD-10-CM

## 2022-09-05 DIAGNOSIS — I5032 Chronic diastolic (congestive) heart failure: Secondary | ICD-10-CM | POA: Diagnosis not present

## 2022-09-05 DIAGNOSIS — G4733 Obstructive sleep apnea (adult) (pediatric): Secondary | ICD-10-CM

## 2022-09-05 DIAGNOSIS — J849 Interstitial pulmonary disease, unspecified: Secondary | ICD-10-CM

## 2022-09-05 DIAGNOSIS — I480 Paroxysmal atrial fibrillation: Secondary | ICD-10-CM

## 2022-09-05 DIAGNOSIS — Z9989 Dependence on other enabling machines and devices: Secondary | ICD-10-CM

## 2022-09-05 DIAGNOSIS — E785 Hyperlipidemia, unspecified: Secondary | ICD-10-CM

## 2022-09-05 DIAGNOSIS — R195 Other fecal abnormalities: Secondary | ICD-10-CM

## 2022-09-05 HISTORY — PX: SUBMUCOSAL TATTOO INJECTION: SHX6856

## 2022-09-05 HISTORY — PX: ENTEROSCOPY: SHX5533

## 2022-09-05 HISTORY — PX: HOT HEMOSTASIS: SHX5433

## 2022-09-05 LAB — FERRITIN: Ferritin: 244 ng/mL (ref 11–307)

## 2022-09-05 LAB — GLUCOSE, CAPILLARY
Glucose-Capillary: 113 mg/dL — ABNORMAL HIGH (ref 70–99)
Glucose-Capillary: 131 mg/dL — ABNORMAL HIGH (ref 70–99)
Glucose-Capillary: 138 mg/dL — ABNORMAL HIGH (ref 70–99)
Glucose-Capillary: 156 mg/dL — ABNORMAL HIGH (ref 70–99)
Glucose-Capillary: 165 mg/dL — ABNORMAL HIGH (ref 70–99)
Glucose-Capillary: 197 mg/dL — ABNORMAL HIGH (ref 70–99)
Glucose-Capillary: 302 mg/dL — ABNORMAL HIGH (ref 70–99)

## 2022-09-05 LAB — URINALYSIS, ROUTINE W REFLEX MICROSCOPIC
Bacteria, UA: NONE SEEN
Bilirubin Urine: NEGATIVE
Glucose, UA: NEGATIVE mg/dL
Hgb urine dipstick: NEGATIVE
Ketones, ur: NEGATIVE mg/dL
Nitrite: NEGATIVE
Protein, ur: NEGATIVE mg/dL
Specific Gravity, Urine: 1.013 (ref 1.005–1.030)
pH: 5 (ref 5.0–8.0)

## 2022-09-05 LAB — BASIC METABOLIC PANEL
Anion gap: 9 (ref 5–15)
BUN: 12 mg/dL (ref 8–23)
CO2: 31 mmol/L (ref 22–32)
Calcium: 8.9 mg/dL (ref 8.9–10.3)
Chloride: 103 mmol/L (ref 98–111)
Creatinine, Ser: 1 mg/dL (ref 0.44–1.00)
GFR, Estimated: >60 mL/min (ref >60)
Glucose, Bld: 161 mg/dL — ABNORMAL HIGH (ref 70–99)
Potassium: 3.3 mmol/L — ABNORMAL LOW (ref 3.5–5.1)
Sodium: 143 mmol/L (ref 135–145)

## 2022-09-05 LAB — BRAIN NATRIURETIC PEPTIDE: B Natriuretic Peptide: 103.4 pg/mL — ABNORMAL HIGH (ref 0.0–100.0)

## 2022-09-05 LAB — IRON AND TIBC
Iron: 39 ug/dL (ref 28–170)
Saturation Ratios: 13 % (ref 10.4–31.8)
TIBC: 297 ug/dL (ref 250–450)
UIBC: 258 ug/dL

## 2022-09-05 LAB — HEMOGLOBIN AND HEMATOCRIT, BLOOD
HCT: 23.3 % — ABNORMAL LOW (ref 36.0–46.0)
HCT: 29.5 % — ABNORMAL LOW (ref 36.0–46.0)
Hemoglobin: 6.9 g/dL — CL (ref 12.0–15.0)
Hemoglobin: 8.9 g/dL — ABNORMAL LOW (ref 12.0–15.0)

## 2022-09-05 LAB — FOLATE: Folate: 7 ng/mL (ref >5.9)

## 2022-09-05 LAB — PHOSPHORUS: Phosphorus: 3.6 mg/dL (ref 2.5–4.6)

## 2022-09-05 LAB — MAGNESIUM: Magnesium: 2 mg/dL (ref 1.7–2.4)

## 2022-09-05 LAB — PREPARE RBC (CROSSMATCH)

## 2022-09-05 LAB — HIV ANTIBODY (ROUTINE TESTING W REFLEX): HIV Screen 4th Generation wRfx: NONREACTIVE

## 2022-09-05 LAB — VITAMIN B12: Vitamin B-12: 596 pg/mL (ref 180–914)

## 2022-09-05 SURGERY — ENTEROSCOPY
Anesthesia: General

## 2022-09-05 MED ORDER — INSULIN ASPART 100 UNIT/ML IJ SOLN
0.0000 [IU] | Freq: Three times a day (TID) | INTRAMUSCULAR | Status: DC
  Administered 2022-09-06 (×2): 5 [IU] via SUBCUTANEOUS

## 2022-09-05 MED ORDER — SALINE SPRAY 0.65 % NA SOLN
1.0000 | NASAL | Status: DC | PRN
  Filled 2022-09-05: qty 44

## 2022-09-05 MED ORDER — SUCCINYLCHOLINE CHLORIDE 200 MG/10ML IV SOSY
PREFILLED_SYRINGE | INTRAVENOUS | Status: DC | PRN
  Administered 2022-09-05: 140 mg via INTRAVENOUS

## 2022-09-05 MED ORDER — PANTOPRAZOLE SODIUM 40 MG PO TBEC
40.0000 mg | DELAYED_RELEASE_TABLET | Freq: Two times a day (BID) | ORAL | Status: DC
  Administered 2022-09-05 – 2022-09-06 (×2): 40 mg via ORAL
  Filled 2022-09-05 (×2): qty 1

## 2022-09-05 MED ORDER — SUCRALFATE 1 G PO TABS
1.0000 g | ORAL_TABLET | Freq: Two times a day (BID) | ORAL | Status: DC
  Administered 2022-09-05 – 2022-09-06 (×2): 1 g via ORAL
  Filled 2022-09-05 (×2): qty 1

## 2022-09-05 MED ORDER — LACTATED RINGERS IV SOLN: INTRAVENOUS | Status: DC | PRN

## 2022-09-05 MED ORDER — LOSARTAN POTASSIUM 50 MG PO TABS
50.0000 mg | ORAL_TABLET | Freq: Every day | ORAL | Status: DC
  Administered 2022-09-05 – 2022-09-06 (×2): 50 mg via ORAL
  Filled 2022-09-05 (×2): qty 1

## 2022-09-05 MED ORDER — FUROSEMIDE 20 MG PO TABS
10.0000 mg | ORAL_TABLET | Freq: Every day | ORAL | Status: DC
  Administered 2022-09-06: 10 mg via ORAL
  Filled 2022-09-05: qty 1

## 2022-09-05 MED ORDER — ATORVASTATIN CALCIUM 40 MG PO TABS
80.0000 mg | ORAL_TABLET | Freq: Every evening | ORAL | Status: DC
  Administered 2022-09-05: 80 mg via ORAL
  Filled 2022-09-05: qty 2

## 2022-09-05 MED ORDER — ALBUTEROL SULFATE (2.5 MG/3ML) 0.083% IN NEBU
INHALATION_SOLUTION | RESPIRATORY_TRACT | Status: AC
  Filled 2022-09-05: qty 3

## 2022-09-05 MED ORDER — LORATADINE 10 MG PO TABS
10.0000 mg | ORAL_TABLET | Freq: Every day | ORAL | Status: DC
  Administered 2022-09-05 – 2022-09-06 (×2): 10 mg via ORAL
  Filled 2022-09-05 (×2): qty 1

## 2022-09-05 MED ORDER — ONDANSETRON HCL 4 MG/2ML IJ SOLN
INTRAMUSCULAR | Status: DC | PRN
  Administered 2022-09-05: 4 mg via INTRAVENOUS

## 2022-09-05 MED ORDER — LABETALOL HCL 5 MG/ML IV SOLN
INTRAVENOUS | Status: DC | PRN
  Administered 2022-09-05: 5 mg via INTRAVENOUS

## 2022-09-05 MED ORDER — DEXAMETHASONE SODIUM PHOSPHATE 10 MG/ML IJ SOLN
INTRAMUSCULAR | Status: DC | PRN
  Administered 2022-09-05: 4 mg via INTRAVENOUS

## 2022-09-05 MED ORDER — ESMOLOL HCL 100 MG/10ML IV SOLN
INTRAVENOUS | Status: DC | PRN
  Administered 2022-09-05: 30 mg via INTRAVENOUS
  Administered 2022-09-05: 20 mg via INTRAVENOUS

## 2022-09-05 MED ORDER — SPOT INK MARKER SYRINGE KIT
PACK | SUBMUCOSAL | Status: AC
  Filled 2022-09-05: qty 5

## 2022-09-05 MED ORDER — LIP MEDEX EX OINT
TOPICAL_OINTMENT | CUTANEOUS | Status: DC | PRN
  Filled 2022-09-05: qty 7

## 2022-09-05 MED ORDER — INSULIN ASPART 100 UNIT/ML IJ SOLN
0.0000 [IU] | Freq: Every day | INTRAMUSCULAR | Status: DC
  Administered 2022-09-05: 4 [IU] via SUBCUTANEOUS

## 2022-09-05 MED ORDER — SPOT INK MARKER SYRINGE KIT
PACK | SUBMUCOSAL | Status: DC | PRN
  Administered 2022-09-05: 2 mL via SUBMUCOSAL

## 2022-09-05 MED ORDER — IPRATROPIUM-ALBUTEROL 0.5-2.5 (3) MG/3ML IN SOLN
RESPIRATORY_TRACT | Status: AC
  Administered 2022-09-05: 3 mL
  Filled 2022-09-05: qty 3

## 2022-09-05 MED ORDER — DILTIAZEM HCL ER COATED BEADS 180 MG PO CP24
360.0000 mg | ORAL_CAPSULE | Freq: Every day | ORAL | Status: DC
  Administered 2022-09-05 – 2022-09-06 (×2): 360 mg via ORAL
  Filled 2022-09-05 (×2): qty 2

## 2022-09-05 MED ORDER — ORAL CARE MOUTH RINSE: 15.0000 mL | OROMUCOSAL | Status: DC | PRN

## 2022-09-05 MED ORDER — ADULT MULTIVITAMIN W/MINERALS CH
1.0000 | ORAL_TABLET | Freq: Every day | ORAL | Status: DC
  Administered 2022-09-05 – 2022-09-06 (×2): 1 via ORAL
  Filled 2022-09-05 (×2): qty 1

## 2022-09-05 MED ORDER — IPRATROPIUM-ALBUTEROL 0.5-2.5 (3) MG/3ML IN SOLN: 3.0000 mL | RESPIRATORY_TRACT | Status: DC | PRN

## 2022-09-05 MED ORDER — SODIUM CHLORIDE 0.9% IV SOLUTION: Freq: Once | INTRAVENOUS | Status: AC

## 2022-09-05 MED ORDER — PROPOFOL 10 MG/ML IV BOLUS
INTRAVENOUS | Status: DC | PRN
  Administered 2022-09-05: 130 mg via INTRAVENOUS

## 2022-09-05 MED ORDER — FUROSEMIDE 10 MG/ML IJ SOLN
40.0000 mg | Freq: Once | INTRAMUSCULAR | Status: AC
  Administered 2022-09-05: 40 mg via INTRAVENOUS
  Filled 2022-09-05: qty 4

## 2022-09-05 MED ORDER — LIDOCAINE 2% (20 MG/ML) 5 ML SYRINGE
INTRAMUSCULAR | Status: DC | PRN
  Administered 2022-09-05: 100 mg via INTRAVENOUS

## 2022-09-05 MED ORDER — HYDROXYCHLOROQUINE SULFATE 200 MG PO TABS
200.0000 mg | ORAL_TABLET | Freq: Two times a day (BID) | ORAL | Status: DC
  Administered 2022-09-05 – 2022-09-06 (×2): 200 mg via ORAL
  Filled 2022-09-05 (×2): qty 1

## 2022-09-05 MED ORDER — MELATONIN 5 MG PO TABS
5.0000 mg | ORAL_TABLET | Freq: Every day | ORAL | Status: DC
  Administered 2022-09-05: 5 mg via ORAL
  Filled 2022-09-05: qty 1

## 2022-09-05 SURGICAL SUPPLY — 15 items

## 2022-09-05 NOTE — Progress Notes (Signed)
Notified admitting provider that the patient had a 4 beat run of V-tach. Admitting provider put in an order for Magnesium, Potassium, and Phosphorus labs to be drawn.

## 2022-09-05 NOTE — Transfer of Care (Signed)
Immediate Anesthesia Transfer of Care Note  Patient: Lynn [name]  Procedure(s) Performed: ENTEROSCOPY HOT HEMOSTASIS (ARGON PLASMA COAGULATION/BICAP) SUBMUCOSAL TATTOO INJECTION  Patient Location: PACU  Anesthesia Type:General  Level of Consciousness: awake  Airway & Oxygen Therapy: Patient Spontanous Breathing and Patient connected to nasal cannula oxygen  Post-op Assessment: Report given to RN and Post -op Vital signs reviewed and stable  Post vital signs: Reviewed and stable  Last Vitals:  Vitals Value Taken Time  BP 152/106 09/05/22 1653  Temp 36.5 C 09/05/22 1653  Pulse 85 09/05/22 1657  Resp 13 09/05/22 1657  SpO2 100 % 09/05/22 1657  Vitals shown include unvalidated device data.  Last Pain:  Vitals:   09/05/22 1653  TempSrc: Temporal  PainSc: 0-No pain         Complications: No notable events documented.

## 2022-09-05 NOTE — Progress Notes (Signed)
Progress Note   Patient: Lynn [name] UXL:[id number] DOB: Sep 19, 1956 DOA: 09/04/2022     1 DOS: the patient was seen and examined on 09/05/2022   Brief hospital course: Lynn [name] was admitted to the hospital with the working diagnosis of symptomatic anemia.  66 yo female with the past medical history of anxiety, coronary artery disease, heart failure, hypertension, dyslipidemia, thalassemia, COPD, ILD, and atrial fibrillation who was referred from the outpatient clinical for worsening anemia. She had persistent melena for 2 weeks, associated with fatigue, dyspnea, chest pain and palpitations, her hgb was noted to be 7,4 from 8,9.  On her initial physical examination her blood pressure was 127/69, HR 78, RR 18 and 02 saturation 100%, lungs with no wheezing or rales, heart with S1 and S2 present and rhythmic, abdomen with no distention, no lower extremity edema.    Na 143, K 3,8 CL 103 bicarbonate 29 glucose 125 bun 15 cr 1,0 Iron 39, TIBC 297, transferrin saturation 13, ferritin 244  Wbc 8,5 hgb 7,4 plt 237, follow up hgb 6,9   Chest radiograph with mild cardiomegaly with no infiltrates or effusions  EKG 77 bpm, normal axis, normal intervals, sinus rhythm with poor R R wave progression, with no significant ST segment or T wave changes.   Assessment and Plan: * GI bleed Patient with melena and lower worsening anemia Sp 2 units PRBC transfusion with follow up Hgb at 8.9  Plan to continue with PPI Follow up with GI recommendations and results from upper endoscopy. Follow up hgb in am.   Essential hypertension Systolic blood pressure has been 120 to 160 mmHg.  Plan to resume antihypertensive regimen with losartan and diltiazem.  Continue blood pressure monitoring   Chronic diastolic CHF (congestive heart failure) (HCC) No clinical signs of decompensation Continue blood pressure control   Type 2 diabetes mellitus with hyperlipidemia (HCC) Glucose has been sable.  Continue with insulin sliding scale for glucose cover and monitoring  Continue with statin therapy.   PAF (paroxysmal atrial fibrillation) ([place]); CHA2DS2-VASc score 7 Continue rate control atrial fibrillation with diltiazem.  Holding on anticoagulation for now until results from upper endoscopy.   Class 3 obesity (HCC) Calculated BMI is 42.8   ILD (interstitial lung disease) ([place]) Recent diagnosis, will continue oxymetry monitoring  Will need to continue follow up as outpatient.         Subjective: Patient has been NPO, no further bleeding, no hematemesis or hematochezia   Physical Exam: Vitals:   09/05/22 0915 09/05/22 1006 09/05/22 1129 09/05/22 1445  BP:  122/68 127/73 (!) 167/80  Pulse:  83 81 80  Resp:  20 18 (!) 21  Temp:  98.3 F (36.8 C) 97.9 F (36.6 C) 97.6 F (36.4 C)  TempSrc:  Oral Oral Temporal  SpO2: 98% 100% 97% 99%  Weight:      Height:       Neurology awake and alert ENT with mild pallor Cardiovascular with S1 and S2 present with no gallops or murmurs, regular rhythm, No JVD No lower extremity edema Respiratory with no rales or wheezing Abdomen with no distention or tenderness  No  lower extremity edema  Data Reviewed:    Family Communication: I spoke with patient's husband at the bedside, we talked in detail about patient's condition, plan of care and prognosis and all questions were addressed.   Disposition: Status is: Inpatient Remains inpatient appropriate because: GI workup in progress, EGD   Planned Discharge Destination: Home  Author: Tawni Millers, MD 09/05/2022 3:13 PM  For on call review [email or web address].

## 2022-09-05 NOTE — Assessment & Plan Note (Addendum)
Patient had IV furosemide in the setting of PRBC transfusions to prevent volume overload. At the time of her discharge with no signs of decompensated heart failure.  She will continue diltiazem, losartan and furosemide.

## 2022-09-05 NOTE — Assessment & Plan Note (Addendum)
Patient was placed on insulin sliding scale during her hospitalization, her glucose remained well controlled.  At the time of discharge will resume her home diabetic regimen.

## 2022-09-05 NOTE — Consult Note (Signed)
Consultation Note   Referring Provider:  [facility] PCP: Lynn Mclean, MD Primary Gastroenterologist:  Lynn Edward, MD  Reason for consultation: anemia  Hospital Day: 2  Assessment    # 66 year old female with thalassemia minor admitted with acute on chronic microcytic anemia, dark heme negative stools on Eliquis.  Hgb 6.9, down from baseline of 8.5 to 9. Doesn't appear to be iron deficient but has recently gotten IV iron with Hematologist. Though heme negative on admission, she could be bleeding intermittently. Rule out GI bleed 2/2 recurrent PUD / erosive gastritis, AVMs.  She is up to date on colon cancer screening with no polyps on last colonoscopy in 2017.   # History of non- H.pylori related PUD, esophageal ulcer in June 2022. Ulcer healing documented on follow up EGD in Nov 2022. She has been compliant with BID PPI  # Afib on Eliquis, last dose 11/21  # COPD / ILD / chronic 02 use  # See PMH for additional medical problems  Plan   Will need EGD vr enteroscopy but not sure this can be done today since she had Eliquis yesterday. Schedule for EGD. The risks and benefits of EGD with possible biopsies were discussed with the patient who agrees to proceed.  Keep NPO until I can confirm whether EGD can be done today Continue BID IV PPI Getting 2nd unit of blood now. Will need post-transfusion hgb   HPI   Lynn [name] is a 66 y.o. female with a past medical history significant for DM, Afib on Eliquis, mitral stenosis, OSA, COPD, ILD, chronic 02, chronic diastolic heart failure , HTN, RA, chronic microcytic anemia, history of thalassemia trait . See PMH for any additional medical problems.  Patient  was hospitalized in June 2022 with melena. EGD showed an non-bleeding esophageal ulcer and gastric ulcers. Biopsies negative for H.pylori. Follow up EGD in Nov 2022 showed only mild erosive gastropathy and a  moderate amount of food in the stomach  Lynn [name] was admitted yesterday after being sent to ED from outside clinic for drop in hgb and dark stools. She has had increased frequency of bowel movements over the last few days. Hemodynamically stable in ED.   Significant studies:  Potassium 3.3 LFTs normal  creatinine 1.01 with normal GFR Hemoglobin 7.4 on admission, down to 6.9 today after a unit of blood during the night. Her baseline hgb is around 8.5 -9 MCV 65 ( chronic)  Lynn [name] has been more SHOB lately. She has been having dark loose stools several times a day over the last couple of weeks. She is not on oral iron, doesn't take bismuth. She has had some recent nausea without vomiting. No upper abdominal pain but intermittent, mild lower abdominal discomfort. She denies NSAID use. Rheumatologist recently started her on a new medication but she doesn't know the name. She has been getting IV Fe iron by Hematologist, last infusion was in September.   Previous GI Evaluation     Nov 2017 colonoscopy for hematochezia -Good prep -One 7 mm polyp in the transverse colon, removed with a cold snare.  - Moderate diverticulosis in the sigmoid colon and in the descending colon. - Diverticulosis in the transverse colon. - The examination was otherwise normal  on direct and retroflexion views. Path - hyperplastic polyp  June 2022 EGD - Esophagogastric landmarks identified. - 1 cm hiatal hernia. - Esophageal erosion / ulcer with no bleeding and no stigmata of recent bleeding. - 2 Non-bleeding small gastric ulcers with no stigmata of bleeding. Biopsied. - Normal stomach otherwise - no heme noted anywhere. Biopsies taken to rule out H pylori - Normal duodenal bulb and second portion of the duodenum. A. STOMACH ULCER BIOPSY:  - Gastric antral mucosa with mild inflammation, hyperemia and reactive  changes.  - Warthin-Starry negative for Helicobacter pylori.  - No intestinal metaplasia, dysplasia or carcinoma.    B. STOMACH ANTRUM BIOPSY:  - Gastric antral and oxyntic mucosa with hyperemia.  - Warthin-Starry negative for Helicobacter pylori.  - No intestinal metaplasia, dysplasia or carcinoma.    EGD Nov 2022 - document ulcer healing  -- normal esophagus - medium amount of food in stomach  -Small hiatal hernia. - Mild erosive antral gastropathy with no bleeding and no stigmata of recent bleeding. - Normal duodenal bulb and second portion of the duodenum. - No specimens collected.   Recent Labs and Imaging DG Chest Port 1 View  Result Date: 09/04/2022 CLINICAL DATA:  Shortness of breath EXAM: PORTABLE CHEST 1 VIEW COMPARISON:  CXR 11/14/21, CT Chest 06/23/21 FINDINGS: No pleural effusion. No pneumothorax. Normal cardiac and mediastinal contours. No focal airspace opacity. No displaced rib fractures. The upper abdomen is poorly visualized. IMPRESSION: No focal airspace opacity. Electronically Signed   By: Marin Roberts M.D.   On: 09/04/2022 17:15    Labs:  Recent Labs    09/04/22 1514 09/04/22 2224 09/05/22 0421  WBC 8.5  --   --   HGB 7.4* 6.9* 6.9*  HCT 25.2* 24.1* 23.3*  PLT 237  --   --    Recent Labs    09/04/22 1514 09/05/22 0421  NA 143 143  K 3.8 3.3*  CL 103 103  CO2 29 31  GLUCOSE 125* 161*  BUN 15 12  CREATININE 1.01* 1.00  CALCIUM 9.4 8.9   Recent Labs    09/04/22 1514  PROT 7.9  ALBUMIN 4.0  AST 24  ALT 19  ALKPHOS 78  BILITOT 0.4   No results for input(s): "HEPBSAG", "HCVAB", "HEPAIGM", "HEPBIGM" in the last 72 hours. Recent Labs    09/04/22 1514  LABPROT 15.8*  INR 1.3*    Past Medical History:  Diagnosis Date   Anxiety    Clotting disorder ([place])    blood clot in eye 2016   Coronary artery disease, non-occlusive    a. 11/2016 NSTEMI/Cath: LM nl, LAD 40p, 50m, D1/2 small, LCX 40ost, OM2/3 nl/small, RCA 35 ost/mid, RPDA/RPL small/nl.   Coronary vasospasm ([facility])    Depression    Diastolic dysfunction    a. 11/2016 Echo: EF 55-60%, gr1 DD, sev  calcified MV annulus, mildly dil LA.   Eye hemorrhage 01/2015   "right; resolved" (07/03/2017)   Headache    "monthly" (07/03/2017)   History of blood transfusion    "when I had an ectopic pregnancy"   History of stomach ulcers    Hyperlipidemia    Hypertension    Microcytic anemia    Moderate mitral stenosis by prior echocardiogram 03/2021   TTE: Mod MS (mean gradient 9 mmHg). -- TEE Moderate-Severe MS (mean gradient 14 mmHg) with fixed Post MV Leaflet & Severe MAC w/ large circumscribed calcified mass on the Post MV Leaflet. (Mass previously described in 2018) - CMRI identifies  calcified mass & not Tumor.; R&LHC - LVEDP & PCWP both 23 mmHg indicating minimal resting gradient   OSA on CPAP    setting is unknown   PAF (paroxysmal atrial fibrillation) ([place]) 03/2021   Event Monitor Aug-Sept 2022: Predominantly sinus rhythm.  1% A. fib burden.  30 brief episodes of PAT.  2 short bursts of NSVT.   PAT (paroxysmal atrial tachycardia)    Event monitor from August 2022 showed 30 brief bursts longest 14 seconds.   Pneumonia    "several times" (07/03/2017)   PVC's (premature ventricular contractions)    Rheumatoid arthritis ([place])    Syncope 07/03/2017 X 2   called seizures but no medications   Thalassemia    "my cells are sickle cell shaped but I don't have sickle cell anemia" (07/03/2017)   Type II diabetes mellitus ([place])     Past Surgical History:  Procedure Laterality Date   48-Hour Monitor  03/18/2017   Sinus rhythm with sinus tachycardia (rate 58-134 BPM)multiple PVCs noted with couplets and bigeminy. One triplet. 7 runs of PAT ranging from 100-130 bpm. Longest was 33 beats.   BIOPSY  04/03/2021   Procedure: BIOPSY;  Surgeon: Yetta Flock, MD;  Location: [facility];  Service: Gastroenterology;;   BREAST BIOPSY Left    BRONCHIAL WASHINGS  03/28/2021   Procedure: BRONCHIAL WASHINGS;  Surgeon: Julian Hy, DO;  Location: [place];  Service: Endoscopy;;   CARDIAC  CATHETERIZATION N/A 11/19/2016   Procedure: Left Heart Cath and Coronary Angiography;  Surgeon: Belva Crome, MD;  Location: [facility];  Service: Cardiovascular.    LM nl, LAD 40p, 64m, D1/2 small, LCX 40ost, OM2/3 nl/small, RCA 35 ost/mid, RPDA/RPL small/nl.   CARDIAC MRI  03/30/2021   Normal LVEF ~53%. 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 No Scar or Late Gadolinium Enhancement on LV Myocardium.   CARPAL TUNNEL RELEASE Right 11/01/2014   COLONOSCOPY     DILATION AND CURETTAGE OF UTERUS     ECTOPIC PREGNANCY SURGERY  X 2   ESOPHAGOGASTRODUODENOSCOPY (EGD) WITH PROPOFOL N/A 04/03/2021   Procedure: ESOPHAGOGASTRODUODENOSCOPY (EGD) WITH PROPOFOL;  Surgeon: AYetta Flock MD;  Location: [facility];  Service: Gastroenterology;  Laterality: N/A;   ESOPHAGOGASTRODUODENOSCOPY (EGD) WITH PROPOFOL N/A 08/15/2021   Procedure: ESOPHAGOGASTRODUODENOSCOPY (EGD) WITH PROPOFOL;  Surgeon: SLadene Artist MD;  Location: [facility];  Service: Endoscopy;  Laterality: N/A;   JOINT REPLACEMENT     KNEE ARTHROSCOPY Left 11/2014   RIGHT/LEFT HEART CATH AND CORONARY ANGIOGRAPHY N/A 08/08/2021   Procedure: RIGHT/LEFT HEART CATH AND CORONARY ANGIOGRAPHY;  Surgeon: HLeonie Man MD;  [facility];  Service: Cardiovascular;Ost LCx 40%. Ost-Prox RCA 35%. Prox-Mid LAD 25%-<25% D2. Mod-Severely Elevated LVEDP. Mild PA HTN -> PA mean 31 mmHg with a PCWP and LVEDP of 23 mmHg   TEE WITHOUT CARDIOVERSION N/A 03/28/2021   Procedure: TRANSESOPHAGEAL ECHOCARDIOGRAM (TEE);  Surgeon: NThayer Headings MD;  [facility];; LVEF 55-60%. Normal LV Fxn & no RWMA. No LAA thrombus. Gr II Ao Plaque. Large circumscribed calcific mass (1.8 x 1.4 cm) anterior to posterior annulus.  Fixed posterior leaflet with Mod-Severe MS (Mean MVG ~14 mmHg, Peak 20 mmhg).  Mild MR   TONSILLECTOMY     TOTAL KNEE ARTHROPLASTY Right 02/18/2015   Procedure: RIGHT  TOTAL KNEE ARTHROPLASTY;  Surgeon: SNetta Cedars MD;  [facility]  Service: Orthopedics;  Laterality: Right;   TOTAL KNEE ARTHROPLASTY Left  08/19/2015   Procedure: LEFT TOTAL KNEE ARTHROPLASTY;  Surgeon: Netta Cedars, MD;  Location: [facility];  Service: Orthopedics;  Laterality: Left;   TRANSTHORACIC ECHOCARDIOGRAM  11/2016; 07/2017:   a) normal LV size, thickness and function. EF 55-60%. GR 1 DD.No RWMA severe mitral annular calcification, but no mitral stenosis. Mild LA dilation;; b) normal LV size and function.  EF 65-75%.  Unable to assess diastolic function.  Aortic sclerosis with no stenosis.  Moderate mitral stenosis? (Gradient 9 mmHg) -?  Mild-mod left atrial enlargement    TRANSTHORACIC ECHOCARDIOGRAM  01/2019   EF 65 to 70%.  Normal function.  Elevated LVEDP-GR 1 DD.  Normal RV size and function.  Large calcific mass on posterior mitral leaflet mild to moderate mitral stenosis.   TRANSTHORACIC ECHOCARDIOGRAM  03/24/2021   EF 60 to 65%.  LV function with normal wall motion.  Moderate basal septal LVH.  GRII DD & Mild LA dilation.-> elevated LVEDP.  Normal RV function.  Mildly elevated PAP.  Ill-defined density measuring 3.23 x 2.1 cm region of the posterior MV leaflet along with dense MAC.  Moderate MS (mean MVG of 9 mmHg.) Normal AoV & RAP. --> Recommend TEE 2/2 concern for MV SBE.   TRANSTHORACIC ECHOCARDIOGRAM  04/26/2022   EF 60 to 65%.  No RWMA.  Moderate LVH with GR 1 DD.  Normal RV size and function.  Mild LA dilation.  Large calcific mass of posterior MV leaflet.  Mean PG 13 mm 3.  Trivial MR.  Mild to moderate MS.  Severe MAC.  Normal AOV.  Normal RAP.   VAGINAL HYSTERECTOMY     VIDEO BRONCHOSCOPY N/A 03/28/2021   Procedure: VIDEO BRONCHOSCOPY WITHOUT FLUORO;  Surgeon: Julian Hy, DO;  Location: [facility];  Service: Endoscopy;  Laterality: N/A;    Family History  Problem Relation Age of Onset   Cancer Mother    Diabetes Mother    Hypertension Mother    Hyperlipidemia  Mother    Cancer Father    Hyperlipidemia Father    Mental illness Sister    Diabetes Sister    Diabetes Maternal Grandmother    Diabetes Maternal Grandfather    Colon cancer Neg Hx    Esophageal cancer Neg Hx    Stomach cancer Neg Hx    Rectal cancer Neg Hx    Tremor Neg Hx    Parkinson's disease Neg Hx     Prior to Admission medications   Medication Sig Start Date End Date Taking? Authorizing Provider  acetaminophen (TYLENOL) 500 MG tablet Take 500 mg by mouth every 6 (six) hours as needed for fever.    [provider]  albuterol (VENTOLIN HFA) 108 (90 Base) MCG/ACT inhaler Inhale 2 puffs into the lungs every 6 (six) hours as needed for wheezing or shortness of breath. 04/03/21   Mercy Riding, MD  apixaban (ELIQUIS) 5 MG TABS tablet Take 1 tablet (5 mg total) by mouth 2 (two) times daily. 10/27/21   Leonie Man, MD  atorvastatin (LIPITOR) 80 MG tablet Take 1 tablet (80 mg total) by mouth every evening. 08/01/22   Copland, Gay Filler, MD  cetirizine (ZYRTEC) 10 MG tablet Take 10 mg by mouth daily as needed for allergies.    [provider]  Continuous Blood Gluc Sensor (DEXCOM G6 SENSOR) MISC 1 Device by Does not apply route as directed. 09/11/21   [name], Melanie Crazier, MD  Continuous Blood Gluc Transmit (DEXCOM G6 TRANSMITTER) MISC 1 Device by Does not  apply route as directed. 09/11/21   [name], Melanie Crazier, MD  diltiazem (CARDIZEM CD) 360 MG 24 hr capsule TAKE 1 CAPSULE BY MOUTH EVERY DAY 05/30/22   Leonie Man, MD  folic acid (FOLVITE) 1 MG tablet Take 2 tablets (2 mg total) by mouth daily. Patient taking differently: Take 1 mg by mouth in the morning and at bedtime. 01/21/20   Copland, Gay Filler, MD  furosemide (LASIX) 20 MG tablet Decrease Lasix ( furosemide)  to 20 mg ( 1/2/tablet)  , with 20 mg  you may take a an additional 20 mg if need for swelling or weight gain more than 3 lbs overnight or 5 lbs in one week -- if you have continue to take  an additional 20 mg , go back to 40 mg  Take additional dose as needed for egt gain> 3 lb in 1 day 11/15/21   Shawna Clamp, MD  furosemide (LASIX) 40 MG tablet PLEASE SEE ATTACHED FOR DETAILED DIRECTIONS 06/20/22   Leonie Man, MD  glucose blood (CONTOUR NEXT TEST) test strip 3x daily 07/20/19   [name], Melanie Crazier, MD  hydroxychloroquine (PLAQUENIL) 200 MG tablet Take 200 mg by mouth 2 (two) times daily. 05/30/22   [provider]  Insulin Disposable Pump (OMNIPOD 5 G6 INTRO, GEN 5,) KIT 1 Device by Does not apply route every other day. 04/13/22   [name], Melanie Crazier, MD  Insulin Disposable Pump (OMNIPOD 5 G6 POD, GEN 5,) MISC 1 Device by Does not apply route every other day. 08/13/22   [name], Melanie Crazier, MD  insulin lispro (HUMALOG) 100 UNIT/ML injection Max Daily 100 units per pump 04/12/22   [name], Melanie Crazier, MD  Insulin Pen Needle (B-D UF III MINI PEN NEEDLES) 31G X 5 MM MISC USE DAILY WITH VICTOZA AND LANTUS. Patient taking differently: Use for ozempic 07/20/19   [name], Melanie Crazier, MD  Insulin Pen Needle 31G X 5 MM MISC 1 Device by Does not apply route 3 (three) times daily. 01/21/20   Copland, Gay Filler, MD  Lancets 30G MISC 1 Device by Does not apply route 3 (three) times daily. 07/16/19   Copland, Gay Filler, MD  leflunomide (ARAVA) 20 MG tablet Take 20 mg by mouth daily. 08/16/21   [provider]  losartan (COZAAR) 100 MG tablet 1 tablet Orally Once a day    [provider]  Melatonin 5 MG CHEW Chew 5 mg by mouth at bedtime.    [provider]  Multiple Vitamin (MULTIVITAMIN WITH MINERALS) TABS tablet Take 1 tablet by mouth daily.    [provider]  Oxymetazoline HCl (VICKS SINEX NA) Place 1 spray into the nose daily as needed (congestion).    [provider]  pantoprazole (PROTONIX) 40 MG tablet TAKE 1 TABLET BY MOUTH TWICE A DAY BEFORE MEALS 06/20/22   Ladene Artist, MD  SAFETY-LOK TB SYRINGE  27GX.5" 27G X 1/2" 1 ML MISC  08/18/19   [provider]  Semaglutide, 2 MG/DOSE, 8 MG/3ML SOPN Inject 2 mg as directed once a week. 04/12/22   [name], Melanie Crazier, MD  tirzepatide [facility]) 5 MG/0.5ML Pen Inject 5 mg into the skin once a week. 08/27/22   [name], Melanie Crazier, MD  traZODone (DESYREL) 50 MG tablet Take 0.5-1 tablets (25-50 mg total) by mouth at bedtime as needed. for sleep 08/01/22   Copland, Gay Filler, MD    Current Facility-Administered Medications  Medication Dose Route Frequency Provider Last Rate Last Admin  0.9 %  sodium chloride infusion   Intravenous Continuous Clance Boll, MD   Stopped at 09/05/22 0715   albuterol (PROVENTIL) (2.5 MG/3ML) 0.083% nebulizer solution 2.5 mg  2.5 mg Nebulization Q6H Myles Rosenthal A, MD   2.5 mg at 09/05/22 0209   insulin aspart (novoLOG) injection 0-15 Units  0-15 Units Subcutaneous Q4H Clance Boll, MD   2 Units at 09/05/22 0820   lip balm (CARMEX) ointment   Topical PRN Arrien, Jimmy Picket, MD       ondansetron [facility]) tablet 4 mg  4 mg Oral Q6H PRN Clance Boll, MD       Or   ondansetron [facility]) injection 4 mg  4 mg Intravenous Q6H PRN Clance Boll, MD       Oral care mouth rinse  15 mL Mouth Rinse PRN Arrien, Jimmy Picket, MD       pantoprazole (PROTONIX) injection 40 mg  40 mg Intravenous Q12H Myles Rosenthal A, MD   40 mg at 09/04/22 2236   sodium chloride (OCEAN) 0.65 % nasal spray 1 spray  1 spray Each Nare PRN Arrien, Jimmy Picket, MD        Allergies as of 09/04/2022 - Review Complete 09/04/2022  Allergen Reaction Noted   Methotrexate Other (See Comments) 01/23/2022   Penicillins Hives 07/22/2012   Farxiga [dapagliflozin] Other (See Comments) 05/01/2019   Metformin and related Other (See Comments) 06/12/2017   Milk [milk (cow)] Diarrhea 01/23/2022    Social History   Socioeconomic History   Marital status: Married    Spouse name: Not on file    Number of children: 1   Years of education: Not on file   Highest education level: Not on file  Occupational History   Occupation: retired  Tobacco Use   Smoking status: Former    Packs/day: 0.25    Years: 44.00    Total pack years: 11.00    Types: Cigarettes    Quit date: 02/17/2015    Years since quitting: 7.5   Smokeless tobacco: Never  Vaping Use   Vaping Use: Never used  Substance and Sexual Activity   Alcohol use: Not Currently    Comment: 07/03/2017 "might have a few drinks/year"   Drug use: No   Sexual activity: Never  Other Topics Concern   Not on file  Social History Narrative   Lives at home with husband   Right handed   Caffeine: 1 cup coffee in AM    Social Determinants of Health   Financial Resource Strain: Low Risk  (06/08/2021)   Overall Financial Resource Strain (CARDIA)    Difficulty of Paying Living Expenses: Not hard at all  Food Insecurity: No Food Insecurity (09/04/2022)   Hunger Vital Sign    Worried About Running Out of Food in the Last Year: Never true    Ran Out of Food in the Last Year: Never true  Transportation Needs: No Transportation Needs (09/04/2022)   PRAPARE - Hydrologist (Medical): No    Lack of Transportation (Non-Medical): No  Physical Activity: Inactive (06/08/2021)   Exercise Vital Sign    Days of Exercise per Week: 0 days    Minutes of Exercise per Session: 0 min  Stress: Stress Concern Present (06/08/2021)   [place]    Feeling of Stress : To some extent  Social Connections: Moderately Isolated (06/08/2021)   Social Connection and Isolation Panel [NHANES]  Frequency of Communication with Friends and Family: More than three times a week    Frequency of Social Gatherings with Friends and Family: More than three times a week    Attends Religious Services: Never    Marine scientist or Organizations: No    Attends Theatre manager Meetings: Never    Marital Status: Married  Human resources officer Violence: Not At Risk (09/04/2022)   Humiliation, Afraid, Rape, and Kick questionnaire    Fear of Current or Ex-Partner: No    Emotionally Abused: No    Physically Abused: No    Sexually Abused: No    Review of Systems: All systems reviewed and negative except where noted in HPI.  Physical Exam: Vital signs in last 24 hours: Temp:  [97.8 F (36.6 C)-99 F (37.2 C)] 98.6 F (37 C) (11/22 0723) Pulse Rate:  [66-87] 80 (11/22 0723) Resp:  [17-20] 17 (11/22 0723) BP: (119-154)/(53-81) 119/53 (11/22 0723) SpO2:  [93 %-100 %] 95 % (11/22 0723) Weight:  [109.8 kg] 109.8 kg (11/22 0520) Last BM Date : 09/04/22  General:  Alert female in NAD Psych:  Pleasant, cooperative. Normal mood and affect Eyes: Pupils equal Ears:  Normal auditory acuity Nose: No deformity, discharge or lesions Neck:  Supple, no masses felt Lungs:  Clear to auscultation.  Heart:  Regular rate, regular rhythm. No lower extremity edema Abdomen:  Soft, nondistended, nontender, active bowel sounds, no masses felt Rectal :  Deferred Msk: Symmetrical without gross deformities.  Neurologic:  Alert, oriented, grossly normal neurologically Skin:  Intact without significant lesions.    Intake/Output from previous day: 11/21 0701 - 11/22 0700 In: 823.4 [P.O.:240; I.V.:237.4; Blood:346] Out: 300 [Urine:300] Intake/Output this shift:  Total I/O In: -  Out: 100 [Urine:100]    Principal Problem:   GI bleed    Tye Savoy, NP-C @  09/05/2022, 8:58 AM

## 2022-09-05 NOTE — Plan of Care (Signed)

## 2022-09-05 NOTE — Assessment & Plan Note (Addendum)
Continue blood pressure control with losartan, isosorbide and diltiazem.

## 2022-09-05 NOTE — H&P (Signed)
GASTROENTEROLOGY PROCEDURE H&P NOTE   Primary Care Physician: [name], MD  HPI: Lynn [name] is a 66 y.o. female who presents for EGD for evaluation of dark stools and symptomatic blood loss anemia with history of atrial fibrillation and need for chronic anticoagulation.  Past Medical History:  Diagnosis Date   Anxiety    Clotting disorder ([place])    blood clot in eye 2016   Coronary artery disease, non-occlusive    a. 11/2016 NSTEMI/Cath: LM nl, LAD 40p, 26m, D1/2 small, LCX 40ost, OM2/3 nl/small, RCA 35 ost/mid, RPDA/RPL small/nl.   Coronary vasospasm ([facility])    Depression    Diastolic dysfunction    a. 11/2016 Echo: EF 55-60%, gr1 DD, sev calcified MV annulus, mildly dil LA.   Eye hemorrhage 01/2015   "right; resolved" (07/03/2017)   Headache    "monthly" (07/03/2017)   History of blood transfusion    "when I had an ectopic pregnancy"   History of stomach ulcers    Hyperlipidemia    Hypertension    Microcytic anemia    Moderate mitral stenosis by prior echocardiogram 03/2021   TTE: Mod MS (mean gradient 9 mmHg). -- TEE Moderate-Severe MS (mean gradient 14 mmHg) with fixed Post MV Leaflet & Severe MAC w/ large circumscribed calcified mass on the Post MV Leaflet. (Mass previously described in 2018) - CMRI identifies calcified mass & not Tumor.; R&LHC - LVEDP & PCWP both 23 mmHg indicating minimal resting gradient   OSA on CPAP    setting is unknown   PAF ([place]oxysmal [place]) [place] 03/[place]   Event Monitor Aug-Sept 2022: Predominantly sinus rhythm.  1% A. fib burden.  30 brief episodes of PAT.  2 short bursts of NSVT.   PAT (paroxysmal atrial tachycardia)    Event monitor from August 2022 showed 30 brief bursts longest 14 seconds.   Pneumonia    "several times" (07/03/2017)   PVC's (premature ventricular contractions)    Rheumatoid arthritis ([place]    Syncope 07/03/2017 X 2   called seizures but no medications   Thalassemia    "my cells are  sickle cell shaped but I don't have sickle cell anemia" (07/03/2017)   Type II diabetes mellitus ([place]    Past Surgical History:  Procedure Laterality Date   48-Hour Monitor  03/18/2017   Sinus rhythm with sinus tachycardia (rate 58-134 BPM)multiple PVCs noted with couplets and bigeminy. One triplet. 7 runs of PAT ranging from 100-130 bpm. Longest was 33 beats.   BIOPSY  04/03/2021   Procedure: BIOPSY;  Surgeon: AYetta Flock MD;  [facility];  Service: Gastroenterology;;   BREAST BIOPSY Left    BRONCHIAL WASHINGS  03/28/2021   Procedure: BRONCHIAL WASHINGS;  Surgeon: CJulian Hy DO;  [place] [facility]  Service: Endoscopy;;   CARDIAC CATHETERIZATION N/A 11/19/2016   Procedure: Left Heart Cath and Coronary Angiography;  Surgeon: HBelva Crome MD;  [facility];  Service: Cardiovascular.    LM nl, LAD 40p, 558m D1/2 small, LCX 40ost, OM2/3 nl/small, RCA 35 ost/mid, RPDA/RPL small/nl.   CARDIAC MRI  03/30/2021   Normal LVEF ~53%. 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 No Scar or Late Gadolinium Enhancement on LV Myocardium.   CARPAL TUNNEL RELEASE Right 11/01/2014   COLONOSCOPY     DILATION AND CURETTAGE OF UTERUS     ECTOPIC PREGNANCY SURGERY  X 2   ESOPHAGOGASTRODUODENOSCOPY (EGD) WITH PROPOFOL N/A 04/03/2021  Procedure: ESOPHAGOGASTRODUODENOSCOPY (EGD) WITH PROPOFOL;  Surgeon: Yetta Flock, MD;  [facility];  Service: Gastroenterology;  Laterality: N/A;   ESOPHAGOGASTRODUODENOSCOPY (EGD) WITH PROPOFOL N/A 08/15/2021   Procedure: ESOPHAGOGASTRODUODENOSCOPY (EGD) WITH PROPOFOL;  Surgeon: Ladene Artist, MD;  Location: [facility];  Service: Endoscopy;  Laterality: N/A;   JOINT REPLACEMENT     KNEE ARTHROSCOPY Left 11/2014   RIGHT/LEFT HEART CATH AND CORONARY ANGIOGRAPHY N/A 08/08/2021   Procedure: RIGHT/LEFT HEART CATH AND CORONARY ANGIOGRAPHY;  Surgeon: Leonie Man, MD;   Location: [place] [facility];  Service: Cardiovascular;Ost LCx 40%. Ost-Prox RCA 35%. Prox-Mid LAD 25%-<25% D2. Mod-Severely Elevated LVEDP. Mild PA HTN -> PA mean 31 mmHg with a PCWP and LVEDP of 23 mmHg   TEE WITHOUT CARDIOVERSION N/A 03/28/2021   Procedure: TRANSESOPHAGEAL ECHOCARDIOGRAM (TEE);  Surgeon: Thayer Headings, MD;  Location: [facility]OSCOPY;; LVEF 55-60%. Normal LV Fxn & no RWMA. No LAA thrombus. Gr II Ao Plaque. Large circumscribed calcific mass (1.8 x 1.4 cm) anterior to posterior annulus.  Fixed posterior leaflet with Mod-Severe MS (Mean MVG ~14 mmHg, Peak 20 mmhg).  Mild MR   TONSILLECTOMY     TOTAL KNEE ARTHROPLASTY Right 02/18/2015   Procedure: RIGHT TOTAL KNEE ARTHROPLASTY;  Surgeon: Netta Cedars, MD;  Location: [place];  Service: Orthopedics;  Laterality: Right;   TOTAL KNEE ARTHROPLASTY Left 08/19/2015   Procedure: LEFT TOTAL KNEE ARTHROPLASTY;  Surgeon: Netta Cedars, MD;  Location: [place];  Service: Orthopedics;  Laterality: Left;   TRANSTHORACIC ECHOCARDIOGRAM  11/2016; 07/2017:   a) normal LV size, thickness and function. EF 55-60%. GR 1 DD.No RWMA severe mitral annular calcification, but no mitral stenosis. Mild LA dilation;; b) normal LV size and function.  EF 65-75%.  Unable to assess diastolic function.  Aortic sclerosis with no stenosis.  Moderate mitral stenosis? (Gradient 9 mmHg) -?  Mild-mod left atrial enlargement    TRANSTHORACIC ECHOCARDIOGRAM  01/2019   EF 65 to 70%.  Normal function.  Elevated LVEDP-GR 1 DD.  Normal RV size and function.  Large calcific mass on posterior mitral leaflet mild to moderate mitral stenosis.   TRANSTHORACIC ECHOCARDIOGRAM  03/24/2021   EF 60 to 65%.  LV function with normal wall motion.  Moderate basal septal LVH.  GRII DD & Mild LA dilation.-> elevated LVEDP.  Normal RV function.  Mildly elevated PAP.  Ill-defined density measuring 3.23 x 2.1 cm region of the posterior MV leaflet along with dense MAC.  Moderate MS (mean MVG of 9 mmHg.)  Normal AoV & RAP. --> Recommend TEE 2/2 concern for MV SBE.   TRANSTHORACIC ECHOCARDIOGRAM  04/26/2022   EF 60 to 65%.  No RWMA.  Moderate LVH with GR 1 DD.  Normal RV size and function.  Mild LA dilation.  Large calcific mass of posterior MV leaflet.  Mean PG 13 mm 3.  Trivial MR.  Mild to moderate MS.  Severe MAC.  Normal AOV.  Normal RAP.   VAGINAL HYSTERECTOMY     VIDEO BRONCHOSCOPY N/A 03/28/2021   Procedure: VIDEO BRONCHOSCOPY WITHOUT FLUORO;  Surgeon: Julian Hy, DO;  Location: [facility] ENDOSCOPY;  Service: Endoscopy;  Laterality: N/A;   Current Facility-Administered Medications  Medication Dose Route Frequency Provider Last Rate Last Admin   [MAR Hold] albuterol (PROVENTIL) (2.5 MG/3ML) 0.083% nebulizer solution 2.5 mg  2.5 mg Nebulization Q6H Myles Rosenthal A, MD   2.5 mg at 09/05/22 0915   [MAR Hold] insulin aspart (novoLOG) injection 0-15 Units  0-15 Units Subcutaneous  Q4H Clance Boll, MD   2 Units at 09/05/22 1241   [MAR Hold] lip balm (CARMEX) ointment   Topical PRN Arrien, Jimmy Picket, MD       [facility] Hold] ondansetron [facility]) tablet 4 mg  4 mg Oral Q6H PRN Clance Boll, MD       Or   [facility] Hold] ondansetron [facility]) injection 4 mg  4 mg Intravenous Q6H PRN Clance Boll, MD       [facility] Hold] Oral care mouth rinse  15 mL Mouth Rinse PRN Arrien, Jimmy Picket, MD       [facility] Hold] pantoprazole (PROTONIX) injection 40 mg  40 mg Intravenous Q12H Myles Rosenthal A, MD   40 mg at 09/05/22 1041   [MAR Hold] sodium chloride (OCEAN) 0.65 % nasal spray 1 spray  1 spray Each Nare PRN Arrien, Jimmy Picket, MD        Current Facility-Administered Medications:    [MAR Hold] albuterol (PROVENTIL) (2.5 MG/3ML) 0.083% nebulizer solution 2.5 mg, 2.5 mg, Nebulization, Q6H, Myles Rosenthal A, MD, 2.5 mg at 09/05/22 0915   [MAR Hold] insulin aspart (novoLOG) injection 0-15 Units, 0-15 Units, Subcutaneous, Q4H, Clance Boll, MD, 2 Units at 09/05/22 1241   [MAR  Hold] lip balm (CARMEX) ointment, , Topical, PRN, Arrien, Jimmy Picket, MD   [facility] Hold] ondansetron [facility]) tablet 4 mg, 4 mg, Oral, Q6H PRN **OR** [MAR Hold] ondansetron (ZOFRAN) injection 4 mg, 4 mg, Intravenous, Q6H PRN, Clance Boll, MD   [facility] Hold] Oral care mouth rinse, 15 mL, Mouth Rinse, PRN, Arrien, Jimmy Picket, MD   [facility] Hold] pantoprazole (PROTONIX) injection 40 mg, 40 mg, Intravenous, Q12H, Myles Rosenthal A, MD, 40 mg at 09/05/22 1041   [MAR Hold] sodium chloride (OCEAN) 0.65 % nasal spray 1 spray, 1 spray, Each Nare, PRN, Arrien, Jimmy Picket, MD Allergies  Allergen Reactions   Methotrexate Other (See Comments)    Pneumonitis   Penicillins Hives    Childhood allergy Has patient had a PCN reaction causing immediate rash, facial/tongue/throat swelling, SOB or lightheadedness with hypotension: Yes Has patient had a PCN reaction causing severe rash involving mucus membranes or skin necrosis: No Has patient had a PCN reaction that required hospitalization No Has patient had a PCN reaction occurring within the last 10 years: No If all of the above answers are "NO", then may proceed with Cephalosporin use.   [name] [Dapagliflozin] Other (See Comments)    Dizzy and lethargic    Metformin And Related Other (See Comments)    Diarrhea, bleeding   Milk [Milk (Cow)] Diarrhea    Milk and dairy products   Family History  Problem Relation Age of Onset   Cancer Mother    Diabetes Mother    Hypertension Mother    Hyperlipidemia Mother    Cancer Father    Hyperlipidemia Father    Mental illness Sister    Diabetes Sister    Diabetes Maternal Grandmother    Diabetes Maternal Grandfather    Colon cancer Neg Hx    Esophageal cancer Neg Hx    Stomach cancer Neg Hx    Rectal cancer Neg Hx    Tremor Neg Hx    Parkinson's disease Neg Hx    Social History   Socioeconomic History   Marital status: Married    Spouse name: Not on file   Number of children: 1    Years of education: Not on file   Highest education level: Not on file  Occupational History   Occupation: retired  Tobacco Use   Smoking status: Former    Packs/day: 0.25    Years: 44.00    Total pack years: 11.00    Types: Cigarettes    Quit date: 02/17/2015    Years since quitting: 7.5   Smokeless tobacco: Never  Vaping Use   Vaping Use: Never used  Substance and Sexual Activity   Alcohol use: Not Currently    Comment: 07/03/2017 "might have a few drinks/year"   Drug use: No   Sexual activity: Never  Other Topics Concern   Not on file  Social History Narrative   Lives at home with husband   Right handed   Caffeine: 1 cup coffee in AM    Social Determinants of Health   Financial Resource Strain: Low Risk  (06/08/2021)   Overall Financial Resource Strain (CARDIA)    Difficulty of Paying Living Expenses: Not hard at all  Food Insecurity: No Food Insecurity (09/04/2022)   Hunger Vital Sign    Worried About Running Out of Food in the Last Year: Never true    [place] in the Last Year: Never true  Transportation Needs: No Transportation Needs (09/04/2022)   PRAPARE - Hydrologist (Medical): No    Lack of Transportation (Non-Medical): No  Physical Activity: Inactive (06/08/2021)   Exercise Vital Sign    Days of Exercise per Week: 0 days    Minutes of Exercise per Session: 0 min  Stress: Stress Concern Present (06/08/2021)   [place]    Feeling of Stress : To some extent  Social Connections: Moderately Isolated (06/08/2021)   Social Connection and Isolation Panel [NHANES]    Frequency of Communication with Friends and Family: More than three times a week    Frequency of Social Gatherings with Friends and Family: More than three times a week    Attends Religious Services: Never    Marine scientist or Organizations: No    Attends Archivist Meetings: Never     Marital Status: Married  Human resources officer Violence: Not At Risk (09/04/2022)   Humiliation, Afraid, Rape, and Kick questionnaire    Fear of Current or Ex-Partner: No    Emotionally Abused: No    Physically Abused: No    Sexually Abused: No    Physical Exam: Today's Vitals   09/05/22 0915 09/05/22 1006 09/05/22 1129 09/05/22 1445  BP:  122/68 127/73 (!) 167/80  Pulse:  83 81 80  Resp:  20 18 (!) 21  Temp:  98.3 F (36.8 C) 97.9 F (36.6 C) 97.6 F (36.4 C)  TempSrc:  Oral Oral Temporal  SpO2: 98% 100% 97% 99%  Weight:      Height:      PainSc:    0-No pain   Body mass index is 42.88 kg/m. GEN: NAD EYE: Sclerae anicteric ENT: MMM CV: Non-tachycardic GI: Soft, NT/ND NEURO:  Alert & Oriented x 3  Lab Results: Recent Labs    09/04/22 1514 09/04/22 2224 09/05/22 0421 09/05/22 1240  WBC 8.5  --   --   --   HGB 7.4* 6.9* 6.9* 8.9*  HCT 25.2* 24.1* 23.3* 29.5*  PLT 237  --   --   --    BMET Recent Labs    09/04/22 1514 09/05/22 0421  NA 143 143  K 3.8 3.3*  CL 103 103  CO2 29 31  GLUCOSE 125* 161*  BUN 15 12  CREATININE 1.01* 1.00  CALCIUM 9.4 8.9   LFT Recent Labs    09/04/22 1514  PROT 7.9  ALBUMIN 4.0  AST 24  ALT 19  ALKPHOS 78  BILITOT 0.4   PT/INR Recent Labs    09/04/22 1514  LABPROT 15.8*  INR 1.3*     Impression / Plan: This is a 66 y.o.female  who presents for EGD for evaluation of dark stools and symptomatic blood loss anemia with history of atrial fibrillation and need for chronic anticoagulation.  The risks and benefits of endoscopic evaluation/treatment were discussed with the patient and/or family; these include but are not limited to the risk of perforation, infection, bleeding, missed lesions, lack of diagnosis, severe illness requiring hospitalization, as well as anesthesia and sedation related illnesses.  The patient's history has been reviewed, patient examined, no change in status, and deemed stable for procedure.  The  patient and/or family is agreeable to proceed.    Justice Britain, MD [facility] Office # [phone]

## 2022-09-05 NOTE — Op Note (Signed)
[facility] Patient Name: Lynn [name] Procedure Date: 09/05/2022 MRN: [id number] Attending MD: Justice Britain , MD, [id number] Date of Birth: [birth year] CSN: [id number] Age: 66 Admit Type: Inpatient Procedure:                Small bowel enteroscopy Indications:              Anemia, Exclusion of peptic ulcer, Dark stools Providers:                Justice Britain, MD, Dulcy Fanny, Brien Mates, Technician Referring MD:             Inpatient Medical Service                           Pricilla Riffle. Fuller Plan, MD Medicines:                Monitored Anesthesia Care Complications:            No immediate complications. Estimated Blood Loss:     Estimated blood loss was minimal. Procedure:                Pre-Anesthesia Assessment:                           - Prior to the procedure, a History and Physical                            was performed, and patient medications and                            allergies were reviewed. The patient's tolerance of                            previous anesthesia was also reviewed. The risks                            and benefits of the procedure and the sedation                            options and risks were discussed with the patient.                            All questions were answered, and informed consent                            was obtained. Prior Anticoagulants: The patient has                            taken Eliquis (apixaban), last dose was 1 day prior                            to procedure. ASA Grade Assessment: III - A patient  with severe systemic disease. After reviewing the                            risks and benefits, the patient was deemed in                            satisfactory condition to undergo the procedure.                           After obtaining informed consent, the endoscope was                            passed under direct vision.  Throughout the                            procedure, the patient's blood pressure, pulse, and                            oxygen saturations were monitored continuously. The                            [id number]-[id number] ([id number] Olympus endoscope was introduced                            through the mouth, and advanced to the second part                            of duodenum. The [id number]-[id number] ([id number]) Olympus                            colonoscope was introduced through the and advanced                            to the. After obtaining informed consent, the                            endoscope was passed under direct vision.                            Throughout the procedure, the patient's blood                            pressure, pulse, and oxygen saturations were                            monitored continuously.The upper GI endoscopy was                            accomplished without difficulty. The patient                            tolerated the procedure. Scope In: Scope Out: Findings:      No gross lesions were noted in the entire esophagus.      The Z-line was  irregular and was found 37 cm from the incisors.      A 1 cm hiatal hernia was present.      Two small angiodysplastic lesions with no bleeding were found in the       cardia and in the gastric antrum. Fulguration to ablate the lesion to       prevent bleeding by argon plasma was successful.      No other gross lesions were noted in the entire examined stomach.      Normal mucosa was found in the entire duodenum.      A single angiodysplastic lesion with no bleeding was found in the       proximal jejunum. Fulguration to ablate the lesion to prevent bleeding       by argon plasma was successful.      Normal mucosa was found in the rest of the visualized proximal jejunum.       Area was tattooed with an injection of Spot (carbon black) to demarcate       the distal extent of today's procedure. Impression:               - No  gross lesions in the entire esophagus. Z-line                            irregular, 37 cm from the incisors.                           - 1 cm hiatal hernia.                           - Two non-bleeding angiodysplastic lesions in the                            stomach. Treated with argon plasma coagulation                            (APC).                           - No other gross lesions in the entire stomach.                           - Normal mucosa was found in the entire examined                            duodenum.                           - A single non-bleeding angiodysplastic lesion in                            the proximal jejunum. Treated with argon plasma                            coagulation (APC).                           - Normal mucosa was found in the rest of the  proximal visualized jejunum. Tattooed distal extent. Recommendation:           - The patient will be observed post-procedure,                            until all discharge criteria are met.                           - Return patient to hospital ward for ongoing care.                           - Advance diet as tolerated.                           - Trend Hgb/Hct.                           - PO PPI BID.                           - Carafate twice daily for 2-weeks.                           - Restart Eliquis on 11/25 AM.                           - Continue other medications otherwise.                           - If patient has transfusion dependent anemia                            develop, then will recommend Colonoscopy + VCE as                            next steps in evaluation.                           - The findings and recommendations were discussed                            with the patient.                           - The findings and recommendations were discussed                            with the referring physician. Procedure Code(s):        --- Professional ---                            [id number], Esophagogastroduodenoscopy, flexible,                            transoral; with control of bleeding, any method Diagnosis Code(s):        --- Professional ---  K22.89, Other specified disease of esophagus                           K44.9, Diaphragmatic hernia without obstruction or                            gangrene                           K31.819, Angiodysplasia of stomach and duodenum                            without bleeding                           D64.9, Anemia, unspecified CPT copyright 2022 [employer]. All rights reserved. The codes documented in this report are preliminary and upon coder review may  be revised to meet current compliance requirements. Justice Britain, MD 09/05/2022 5:04:28 PM Number of Addenda: 0

## 2022-09-05 NOTE — TOC Initial Note (Signed)
Transition of Care [facility]) - Initial/Assessment Note    Patient Details  Name: Lynn [name] MRN: [id number] Date of Birth: 1956-08-03  Transition of Care [facility]) CM/SW Contact:    Leeroy Cha, RN Phone Number: 09/05/2022, 8:24 AM  Clinical Narrative:                  Transition of Care [facility]) Screening Note   Patient Details  Name: Lynn [name] Date of Birth: 12/10/1955   Transition of Care [facility]) CM/SW Contact:    Leeroy Cha, RN Phone Number: 09/05/2022, 8:24 AM    Transition of Care Department (TOC) has reviewed patient and no TOC needs have been identified at this time. We will continue to monitor patient advancement through interdisciplinary progression rounds. If new patient transition needs arise, please place a TOC consult.    Expected Discharge Plan: Home/Self Care Barriers to Discharge: Continued Medical Work up   Patient Goals and CMS Choice Patient states their goals for this hospitalization and ongoing recovery are:: to go home   Choice offered to / list presented to : Patient  Expected Discharge Plan and Services Expected Discharge Plan: Home/Self Care   Discharge Planning Services: CM Consult   Living arrangements for the past 2 months: Single Family Home                                      Prior Living Arrangements/Services Living arrangements for the past 2 months: Single Family Home Lives with:: Spouse Patient language and need for interpreter reviewed:: Yes Do you feel safe going back to the place where you live?: Yes            Criminal Activity/Legal Involvement Pertinent to Current Situation/Hospitalization: No - Comment as needed  Activities of Daily Living Home Assistive Devices/Equipment: CBG Meter, Other (Comment), Oxygen, Eyeglasses (stair lift, scooter, dexcon, omnipod) ADL Screening (condition at time of admission) Patient's cognitive ability adequate to safely complete daily activities?:  Yes Is the patient deaf or have difficulty hearing?: No Does the patient have difficulty seeing, even when wearing glasses/contacts?: No Does the patient have difficulty concentrating, remembering, or making decisions?: No Patient able to express need for assistance with ADLs?: Yes Does the patient have difficulty dressing or bathing?: No Independently performs ADLs?: Yes (appropriate for developmental age) Communication: Independent Dressing (OT): Independent Grooming: Independent Feeding: Independent Bathing: Independent Toileting: Independent In/Out Bed: Independent Walks in Home: Independent Does the patient have difficulty walking or climbing stairs?: Yes Weakness of Legs: Both Weakness of Arms/Hands: Both  Permission Sought/Granted                  Emotional Assessment Appearance:: Appears stated age Attitude/Demeanor/Rapport: Engaged Affect (typically observed): Calm Orientation: : Oriented to Self, Oriented to Place, Oriented to  Time, Oriented to Situation Alcohol / Substance Use: Not Applicable Psych Involvement: No (comment)  Admission diagnosis:  GI bleed [K92.2] Symptomatic anemia [D64.9] Patient Active Problem List   Diagnosis Date Noted   GI bleed 09/04/2022   IDA (iron deficiency anemia) 02/09/2022   Acute on chronic respiratory failure with hypoxia ([place]) 11/14/2021   AKI (acute kidney injury) ([place])    Syncope 11/13/2021   Acute gastric ulcer without hemorrhage or perforation    Ulcer of esophagus without bleeding    PAF (paroxysmal atrial fibrillation) ([place]); CHA2DS2-VASc score 7 07/05/2021   Anemia    Acute gastric ulcer  with hemorrhage    Occult GI bleeding    Melena    Preop respiratory exam    ILD (interstitial lung disease) (HCC)    SOB (shortness of breath) 03/24/2021   FUO (fever of unknown origin) 03/24/2021   Hypokalemia 03/24/2021   Generalized weakness 03/24/2021   Coronary artery spasm ([place]) 08/09/2020   Rheumatoid arthritis ([place])  11/09/2019   Toe pain, bilateral 05/26/2019   Type 2 diabetes mellitus with hyperglycemia, with long-term current use of insulin ([place]) 05/26/2019   Mobility impaired 04/01/2019   Morbid obesity due to excess calories ([place]) 12/03/2017   DOE (dyspnea on exertion) 12/02/2017   Moderate mitral stenosis by prior echocardiography 08/23/2017   Chronic diastolic CHF (congestive heart failure) ([place]) 08/12/2017   Chronic respiratory failure with hypoxia ([place]) 08/12/2017   OSA on CPAP 08/12/2017   COPD with acute bronchitis ([employer]) 08/12/2017   Near syncope 07/03/2017   Anxiety 07/03/2017   Coronary artery disease, non-occlusive 12/08/2016   PAT (paroxysmal atrial tachycardia)    H/O total knee replacement 08/19/2015   Vision, loss, sudden 03/07/2015   S/P TKR (total knee replacement) using cement 02/18/2015   Thalassemia 09/02/2013   Hyperlipidemia associated with type 2 diabetes mellitus ([place]) 08/24/2012   Essential hypertension 07/22/2012   PCP:  [name], MD Pharmacy:   [id number] [id number]: [phone]: [phone] [facility] [address]Phone: [phone]: [phone](New Address) - [place] [address] Previously: [id number]: [phone]: [phone]    Social Determinants of Health (SDOH) Interventions    Readmission Risk Interventions   No data to display

## 2022-09-05 NOTE — Hospital Course (Signed)
Lynn [name] was admitted to the hospital with the working diagnosis of symptomatic anemia.  66 yo female with the past medical history of anxiety, coronary artery disease, heart failure, hypertension, dyslipidemia, thalassemia, COPD, ILD, and atrial fibrillation who was referred from the outpatient clinical for worsening anemia. She had persistent melena for 2 weeks, associated with fatigue, dyspnea, chest pain and palpitations, her hgb was noted to be 7,4 from 8,9.  On her initial physical examination her blood pressure was 127/69, HR 78, RR 18 and 02 saturation 100%, lungs with no wheezing or rales, heart with S1 and S2 present and rhythmic, abdomen with no distention, no lower extremity edema.    Na 143, K 3,8 CL 103 bicarbonate 29 glucose 125 bun 15 cr 1,0 Iron 39, TIBC 297, transferrin saturation 13, ferritin 244  Wbc 8,5 hgb 7,4 plt 237, follow up hgb 6,9   Chest radiograph with mild cardiomegaly with no infiltrates or effusions  EKG 77 bpm, normal axis, normal intervals, sinus rhythm with poor R R wave progression, with no significant ST segment or T wave changes.   Patient had 2 units PRBC transfused with good toleration.   11/22 EGD with two non bleeding angiodysplastic lesions in the stomach. Treated with argon plasma coagulation. Single non bleeding angiodysplastic lesion in the proximal jejunum.  Follow up hgb has been stable, patient will continue with antiacid therapy as outpatient.

## 2022-09-05 NOTE — Anesthesia Postprocedure Evaluation (Signed)
Anesthesia Post Note  Patient: Lynn [name]  Procedure(s) Performed: ENTEROSCOPY HOT HEMOSTASIS (ARGON PLASMA COAGULATION/BICAP) SUBMUCOSAL TATTOO INJECTION     Patient location during evaluation: PACU Anesthesia Type: General Level of consciousness: awake and alert Pain management: pain level controlled Vital Signs Assessment: post-procedure vital signs reviewed and stable Respiratory status: spontaneous breathing, nonlabored ventilation, respiratory function stable and patient connected to nasal cannula oxygen Cardiovascular status: blood pressure returned to baseline and stable Postop Assessment: no apparent nausea or vomiting Anesthetic complications: no   No notable events documented.  Last Vitals:  Vitals:   09/05/22 1703 09/05/22 1713  BP: (!) 148/83 (!) 152/75  Pulse: 81 (!) 104  Resp: 15 (!) 22  Temp:    SpO2: 100% 97%    Last Pain:  Vitals:   09/05/22 1713  TempSrc:   PainSc: 0-No pain                 Effie Berkshire

## 2022-09-05 NOTE — Assessment & Plan Note (Signed)
Recent diagnosis, will continue oxymetry monitoring  Will need to continue follow up as outpatient.

## 2022-09-05 NOTE — Anesthesia Preprocedure Evaluation (Addendum)
Anesthesia Evaluation  Patient identified by MRN, date of birth, ID band Patient awake    Reviewed: Allergy & Precautions, NPO status , Patient's Chart, lab work & pertinent test results  History of Anesthesia Complications Negative for: history of anesthetic complications  Airway Mallampati: III  TM Distance: >3 FB Neck ROM: Full    Dental  (+) Poor Dentition, Missing,    Pulmonary sleep apnea and Continuous Positive Airway Pressure Ventilation , COPD, former smoker   Pulmonary exam normal        Cardiovascular hypertension, Pt. on medications + CAD and +CHF  Normal cardiovascular exam+ dysrhythmias (on Eliquis) Atrial Fibrillation   TTE 04/26/22: EF 60-65%,moderate LVH, grade I DD, moderate LAE, large calcific mass on the posterior leaflet of the mitral valve, mild to moderate MS    Neuro/Psych  Headaches  Anxiety Depression       GI/Hepatic Neg liver ROS, PUD,GERD  Medicated and Controlled,,  Endo/Other  diabetes (on Mounjaro, last dose 3 days ago), Type 2, Insulin Dependent    Renal/GU negative Renal ROS  negative genitourinary   Musculoskeletal  (+) Arthritis , Rheumatoid disorders,    Abdominal   Peds  Hematology negative hematology ROS (+)   Anesthesia Other Findings Day of surgery medications reviewed with patient.  Reproductive/Obstetrics negative OB ROS                              Anesthesia Physical Anesthesia Plan  ASA: 3  Anesthesia Plan: General   Post-op Pain Management: Minimal or no pain anticipated   Induction: Intravenous and Rapid sequence  PONV Risk Score and Plan: 3 and Treatment may vary due to age or medical condition, Propofol infusion, Ondansetron and Dexamethasone  Airway Management Planned: Oral ETT  Additional Equipment: None  Intra-op Plan:   Post-operative Plan: Extubation in OR  Informed Consent: I have reviewed the patients History and  Physical, chart, labs and discussed the procedure including the risks, benefits and alternatives for the proposed anesthesia with the patient or authorized representative who has indicated his/her understanding and acceptance.     Dental advisory given  Plan Discussed with: CRNA  Anesthesia Plan Comments:         Anesthesia Quick Evaluation

## 2022-09-05 NOTE — Assessment & Plan Note (Signed)
Patient with melena and lower worsening anemia Sp 2 units PRBC transfusion with follow up Hgb at 8.9  Plan to continue with PPI Follow up with GI recommendations and results from upper endoscopy. Follow up hgb in am.

## 2022-09-05 NOTE — Assessment & Plan Note (Signed)
Calculated BMI is 42.8

## 2022-09-05 NOTE — Anesthesia Procedure Notes (Signed)
Procedure Name: Intubation Date/Time: 09/05/2022 4:06 PM  Performed by: Milford Cage, CRNAPre-anesthesia Checklist: Patient identified, Emergency Drugs available, Suction available and Patient being monitored Patient Re-evaluated:Patient Re-evaluated prior to induction Oxygen Delivery Method: Circle system utilized Preoxygenation: Pre-oxygenation with 100% oxygen Induction Type: IV induction Ventilation: Mask ventilation without difficulty Laryngoscope Size: Miller and 2 Grade View: Grade II Tube type: Oral Tube size: 7.0 mm Number of attempts: 1 Airway Equipment and Method: Stylet Placement Confirmation: ETT inserted through vocal cords under direct vision, positive ETCO2 and breath sounds checked- equal and bilateral Secured at: 21 cm Tube secured with: Tape Dental Injury: Teeth and Oropharynx as per pre-operative assessment

## 2022-09-05 NOTE — Assessment & Plan Note (Addendum)
Continue rate control atrial fibrillation with diltiazem.  Holding on anticoagulation for now until results from upper endoscopy.

## 2022-09-06 DIAGNOSIS — E1169 Type 2 diabetes mellitus with other specified complication: Secondary | ICD-10-CM | POA: Diagnosis not present

## 2022-09-06 DIAGNOSIS — D649 Anemia, unspecified: Secondary | ICD-10-CM

## 2022-09-06 DIAGNOSIS — I10 Essential (primary) hypertension: Secondary | ICD-10-CM | POA: Diagnosis not present

## 2022-09-06 DIAGNOSIS — K5520 Angiodysplasia of colon without hemorrhage: Secondary | ICD-10-CM

## 2022-09-06 DIAGNOSIS — K922 Gastrointestinal hemorrhage, unspecified: Secondary | ICD-10-CM | POA: Diagnosis not present

## 2022-09-06 DIAGNOSIS — I5032 Chronic diastolic (congestive) heart failure: Secondary | ICD-10-CM | POA: Diagnosis not present

## 2022-09-06 LAB — HEMOGLOBIN AND HEMATOCRIT, BLOOD
HCT: 28.8 % — ABNORMAL LOW (ref 36.0–46.0)
Hemoglobin: 8.7 g/dL — ABNORMAL LOW (ref 12.0–15.0)

## 2022-09-06 LAB — BPAM RBC
Blood Product Expiration Date: 202312212359
Blood Product Expiration Date: 202312212359
ISSUE DATE / TIME: 202311212317
ISSUE DATE / TIME: 202311220658
Unit Type and Rh: 5100
Unit Type and Rh: 5100

## 2022-09-06 LAB — TYPE AND SCREEN
ABO/RH(D): O POS
Antibody Screen: NEGATIVE
Unit division: 0
Unit division: 0

## 2022-09-06 LAB — BASIC METABOLIC PANEL
Anion gap: 8 (ref 5–15)
BUN: 13 mg/dL (ref 8–23)
CO2: 31 mmol/L (ref 22–32)
Calcium: 8.7 mg/dL — ABNORMAL LOW (ref 8.9–10.3)
Chloride: 99 mmol/L (ref 98–111)
Creatinine, Ser: 1.1 mg/dL — ABNORMAL HIGH (ref 0.44–1.00)
GFR, Estimated: 55 mL/min — ABNORMAL LOW (ref >60)
Glucose, Bld: 228 mg/dL — ABNORMAL HIGH (ref 70–99)
Potassium: 3.8 mmol/L (ref 3.5–5.1)
Sodium: 138 mmol/L (ref 135–145)

## 2022-09-06 LAB — GLUCOSE, CAPILLARY
Glucose-Capillary: 216 mg/dL — ABNORMAL HIGH (ref 70–99)
Glucose-Capillary: 214 mg/dL — ABNORMAL HIGH (ref 70–99)

## 2022-09-06 MED ORDER — APIXABAN 5 MG PO TABS: 5.0000 mg | ORAL_TABLET | Freq: Two times a day (BID) | ORAL | 3 refills | Status: DC

## 2022-09-06 MED ORDER — PANTOPRAZOLE SODIUM 40 MG PO TBEC: 40.0000 mg | DELAYED_RELEASE_TABLET | Freq: Two times a day (BID) | ORAL | 0 refills | Status: DC

## 2022-09-06 MED ORDER — SUCRALFATE 1 G PO TABS: 1.0000 g | ORAL_TABLET | Freq: Two times a day (BID) | ORAL | 0 refills | Status: DC

## 2022-09-06 MED ORDER — SODIUM CHLORIDE 0.9 % IV SOLN: INTRAVENOUS | Status: DC

## 2022-09-06 NOTE — Progress Notes (Signed)
Gastroenterology Inpatient Follow-up Note   PATIENT IDENTIFICATION  Lynn [name] is a 66 y.o. female with a pmh significant for CHF, mitral stenosis, atrial fibrillation (on Eliquis), COPD (on chronic O2), OSA, hypertension, RA, thalassemia trait, previous history of PUD, diverticulosis.  Patient admitted with acute on chronic anemia with heme-negative stool but 2 weeks of dark stools per patient report. Hospital Day: 3  SUBJECTIVE  The patient's chart has been reviewed. The patient's labs were reviewed.  Her hemoglobin is stable. Today, the patient is doing and feeling well.  She just got off a FaceTime with her sister-in-law. No evidence of persistent/progressive blood loss anemia with regards to bowel movements. The patient denies fevers or chills. The patient denies any new abdominal pain or discomfort.   OBJECTIVE  Scheduled Inpatient Medications:   atorvastatin  80 mg Oral QPM   diltiazem  360 mg Oral Daily   furosemide  10 mg Oral Daily   hydroxychloroquine  200 mg Oral BID   insulin aspart  0-15 Units Subcutaneous TID WC   insulin aspart  0-5 Units Subcutaneous QHS   loratadine  10 mg Oral Daily   losartan  50 mg Oral Daily   melatonin  5 mg Oral QHS   multivitamin with minerals  1 tablet Oral Daily   pantoprazole  40 mg Oral BID AC   sucralfate  1 g Oral BID   Continuous Inpatient Infusions:   sodium chloride     PRN Inpatient Medications: ipratropium-albuterol, lip balm, ondansetron **OR** ondansetron (ZOFRAN) IV, mouth rinse, sodium chloride   Physical Examination  Temp:  [97.6 F (36.4 C)-98.6 F (37 C)] 97.9 F (36.6 C) (11/23 0500) Pulse Rate:  [74-104] 74 (11/23 0500) Resp:  [15-26] 20 (11/23 0500) BP: (119-167)/(53-106) 135/62 (11/23 0500) SpO2:  [93 %-100 %] 98 % (11/23 0500) Weight:  [108.7 kg] 108.7 kg (11/23 0500) Temp (24hrs), Avg:98 F (36.7 C), Min:97.6 F (36.4 C), Max:98.6 F (37 C)  Weight: 108.7 kg GEN: NAD, appears younger  than stated age, doesn't appear chronically ill PSYCH: Cooperative, without pressured speech EYE: Conjunctivae pink, sclerae anicteric ENT: MMM, [place] in place CV: Nontachycardic RESP: No audible wheezing GI: NABS, soft, protuberant abdomen, rounded, no rebound/guarding SKIN: No jaundice NEURO:  Alert & Oriented x 3, no focal deficits   Review of Data   Laboratory Studies   Recent Labs  Lab 09/05/22 0421 09/06/22 0421  NA 143 138  K 3.3* 3.8  CL 103 99  CO2 31 31  BUN 12 13  CREATININE 1.00 1.10*  GLUCOSE 161* 228*  CALCIUM 8.9 8.7*  MG 2.0  --   PHOS 3.6  --    Recent Labs  Lab 09/04/22 1514  AST 24  ALT 19  ALKPHOS 78    Recent Labs  Lab 09/04/22 1514 09/04/22 2224 09/06/22 0421  WBC 8.5  --   --   HGB 7.4*   < > 8.7*  HCT 25.2*   < > 28.8*  PLT 237  --   --    < > = values in this interval not displayed.   Recent Labs  Lab 09/04/22 1514  INR 1.3*   Imaging Studies  DG Chest Port 1 View  Result Date: 09/04/2022 CLINICAL DATA:  Shortness of breath EXAM: PORTABLE CHEST 1 VIEW COMPARISON:  CXR 11/14/21, CT Chest 06/23/21 FINDINGS: No pleural effusion. No pneumothorax. Normal cardiac and mediastinal contours. No focal airspace opacity. No displaced rib fractures. The upper abdomen is poorly  visualized. IMPRESSION: No focal airspace opacity. Electronically Signed   By: Marin Roberts M.D.   On: 09/04/2022 17:15    GI Procedures and Studies  Enteroscopy - No gross lesions in the entire esophagus. Z-line irregular, 37 cm from the incisors. - 1 cm hiatal hernia. - Two non-bleeding angiodysplastic lesions in the stomach. Treated with argon plasma coagulation (APC). - No other gross lesions in the entire stomach. - Normal mucosa was found in the entire examined duodenum. - A single non-bleeding angiodysplastic lesion in the proximal jejunum. Treated with argon plasma coagulation (APC). - Normal mucosa was found in the rest of the proximal visualized jejunum.  Tattooed distal extent.   ASSESSMENT  Lynn [name] is a 66 y.o. female  with a pmh significant for CHF, mitral stenosis, atrial fibrillation (on Eliquis), COPD (on chronic O2), OSA, hypertension, RA, thalassemia trait, previous history of PUD, diverticulosis.  Patient admitted with acute on chronic anemia with heme-negative stool but 2 weeks of dark stools per patient report.  The patient is hemodynamically and clinically stable at this time.  Etiology of her dark stools (albeit heme-negative) likely from these angiectasia's that were found.  Patient made aware that angiectasia's/angiodysplasias can develop in other areas of the small bowel or colon.  She has not had a colonoscopy in years.  If the patient has a repeat hospitalization or progressive anemia develop (and we do not believe it to be thalassemia related) then I would recommend patient have consideration for colonoscopy +/- video capsule endoscopy.  From a GI standpoint I see no contraindication to her not being able to be discharged later today or tomorrow if there are no other medical issues.  She can follow-up with hematology as she is already hooked up with them in the outpatient setting and with her primary care provider.  They can certainly reach out to GI if other issues develop or progressive anemia recurs.  The patient agrees with this plan of action.   PLAN/RECOMMENDATIONS  Continue Protonix 40 mg twice daily Carafate 1 g twice daily for 2 weeks May restart Eliquis on 11/25 AM - If earlier anticoagulation is needed then consider IV heparin Should patient have recurrent anemia and it is not felt to be thalassemia related then consider colonoscopy +/- VCE - No further inpatient GI work-up is required at this time however   The inpatient GI service will sign off at this time but if issues arise, please page/call with questions or concerns.   Justice Britain, MD [facility]oscopy Office #  [phone]    LOS: 2 days  Irving Copas  09/06/2022, 6:14 AM

## 2022-09-06 NOTE — Assessment & Plan Note (Addendum)
Continue with hydroxychloroquine

## 2022-09-06 NOTE — Plan of Care (Signed)

## 2022-09-06 NOTE — Assessment & Plan Note (Signed)
Hgb stable, follow up as outpatient Iron panel with serum iron at 39, TIBC with 297, transferrin saturation 13 and ferritin 244, consistent with mild iron deficiency anaemia combined with anemia of chronic disease. Follow up as outpatient.

## 2022-09-06 NOTE — Discharge Summary (Addendum)
Physician Discharge Summary   Patient: Lynn [name] MRN: [id number] DOB: 19-Oct-1955  Admit date:     09/04/2022  Discharge date: 09/06/22  Discharge Physician: Jimmy Picket Knolan Simien   PCP: [name], MD   Recommendations at discharge:   Patient with no further bleeding, plan to resume apixaban on 09/08/22 Continue with pantoprazole 40 mg po bid and added sucralfate 1 g bid for 2 weeks.  If patient develops transfusion dependent anemia, will need colonoscopy and video capsule endoscopy as next steps.   Discharge Diagnoses: Principal Problem:   Acute upper GI bleed Active Problems:   Essential hypertension   Chronic diastolic CHF (congestive heart failure) (HCC)   Type 2 diabetes mellitus with hyperlipidemia (HCC)   PAF (paroxysmal atrial fibrillation) (HCC); CHA2DS2-VASc score 7   Class 3 obesity (HCC)   ILD (interstitial lung disease) ([place])  Resolved Problems:   * No resolved hospital problems. [facility] Course: Mrs. Aufiero was admitted to the hospital with the working diagnosis of symptomatic anemia.  66 yo female with the past medical history of anxiety, coronary artery disease, heart failure, hypertension, dyslipidemia, thalassemia, COPD, ILD, and atrial fibrillation who was referred from the outpatient clinical for worsening anemia. She had persistent melena for 2 weeks, associated with fatigue, dyspnea, chest pain and palpitations, her hgb was noted to be 7,4 from 8,9.  On her initial physical examination her blood pressure was 127/69, HR 78, RR 18 and 02 saturation 100%, lungs with no wheezing or rales, heart with S1 and S2 present and rhythmic, abdomen with no distention, no lower extremity edema.    Na 143, K 3,8 CL 103 bicarbonate 29 glucose 125 bun 15 cr 1,0 Iron 39, TIBC 297, transferrin saturation 13, ferritin 244  Wbc 8,5 hgb 7,4 plt 237, follow up hgb 6,9   Chest radiograph with mild cardiomegaly with no infiltrates or effusions  EKG 77  bpm, normal axis, normal intervals, sinus rhythm with poor R R wave progression, with no significant ST segment or T wave changes.   Patient had 2 units PRBC transfused with good toleration.   11/22 EGD with two non bleeding angiodysplastic lesions in the stomach. Treated with argon plasma coagulation. Single non bleeding angiodysplastic lesion in the proximal jejunum.  Follow up hgb has been stable, patient will continue with antiacid therapy as outpatient.   Assessment and Plan: * Acute upper GI bleed Patient with melena and lower worsening anemia Sp 2 units PRBC transfusion with follow up Hgb at 8.7  11/22  EGD with no gross lesions in the esophagus, two non bleeding angiodysplastic lesions in the stomach.  Single non bleeding angiodysplastic lesion in the proximal jejunum.  Lesions treated with argon plasma coagulation.   If patient develops transfusion dependent anemia, will need outpatient colonoscopy plus VCE as next steps.   Acute blood loss anemia due to upper GI bleed, patient had 2 units PRBC transfusion with good toleration, her follow hgb is stable with no signs of further bleeding.   Essential hypertension Continue blood pressure control with losartan, isosorbide and diltiazem.    Chronic diastolic CHF (congestive heart failure) ([place]) Patient had IV furosemide in the setting of PRBC transfusions to prevent volume overload. At the time of her discharge with no signs of decompensated heart failure.  She will continue diltiazem, losartan and furosemide.   Type 2 diabetes mellitus with hyperlipidemia (HCC) Patient was placed on insulin sliding scale during her hospitalization, her glucose remained well controlled.  At the  time of discharge will resume her home diabetic regimen.   PAF (paroxysmal atrial fibrillation) ([place]); CHA2DS2-VASc score 7 Continue rate control atrial fibrillation with diltiazem.  Anticoagulation was held during her hospitalization due to acute  anemia. Plan to resume apixaban on 09/08/22.   Class 3 obesity (HCC) Calculated BMI is 42.8   ILD (interstitial lung disease) ([place]) Recent diagnosis, continue with home 02 supplementation.  Will need to continue follow up as outpatient.    Thalassemia Hgb stable, follow up as outpatient Iron panel with serum iron at 39, TIBC with 297, transferrin saturation 13 and ferritin 244, consistent with mild iron deficiency anaemia combined with anemia of chronic disease. Follow up as outpatient.   Rheumatoid arthritis ([place]) Continue with hydroxychloroquine.          Consultants: GI  Procedures performed: upper endoscopy   Disposition: Home Diet recommendation:  Discharge Diet Orders (From admission, onward)     Start     Ordered   09/06/22 0000  Diet - low sodium heart healthy        09/06/22 1049           Cardiac and Carb modified diet DISCHARGE MEDICATION: Allergies as of 09/06/2022       Reactions   Methotrexate Other (See Comments)   Pneumonitis   Penicillins Hives   Childhood allergy Has patient had a PCN reaction causing immediate rash, facial/tongue/throat swelling, SOB or lightheadedness with hypotension: Yes Has patient had a PCN reaction causing severe rash involving mucus membranes or skin necrosis: No Has patient had a PCN reaction that required hospitalization No Has patient had a PCN reaction occurring within the last 10 years: No If all of the above answers are "NO", then may proceed with Cephalosporin use.   [name] [dapagliflozin] Other (See Comments)   Dizzy and lethargic   Metformin And Related Other (See Comments)   Diarrhea, bleeding   Milk [milk (cow)] Diarrhea   Milk and dairy products        Medication List     STOP taking these medications    Semaglutide (2 MG/DOSE) 8 MG/3ML Sopn       TAKE these medications    acetaminophen 500 MG tablet Commonly known as: TYLENOL Take 500 mg by mouth every 6 (six) hours as needed for  fever or moderate pain.   albuterol 108 (90 Base) MCG/ACT inhaler Commonly known as: VENTOLIN HFA Inhale 2 puffs into the lungs every 6 (six) hours as needed for wheezing or shortness of breath. What changed: when to take this   apixaban 5 MG Tabs tablet Commonly known as: ELIQUIS Take 1 tablet (5 mg total) by mouth 2 (two) times daily. Resume taking on 09/08/22 What changed: additional instructions   atorvastatin 80 MG tablet Commonly known as: LIPITOR Take 1 tablet (80 mg total) by mouth every evening.   B-D UF III MINI PEN NEEDLES 31G X 5 MM Misc Generic drug: Insulin Pen Needle USE DAILY WITH VICTOZA AND LANTUS. What changed: additional instructions   Insulin Pen Needle 31G X 5 MM Misc 1 Device by Does not apply route 3 (three) times daily. What changed: Another medication with the same name was changed. Make sure you understand how and when to take each.   cetirizine 10 MG tablet Commonly known as: ZYRTEC Take 10 mg by mouth daily as needed for allergies.   Contour Next Test test strip Generic drug: glucose blood 3x daily   Dexcom G6 Sensor Misc 1 Device by Does  not apply route as directed.   Dexcom G6 Transmitter Misc 1 Device by Does not apply route as directed.   diltiazem 360 MG 24 hr capsule Commonly known as: CARDIZEM CD TAKE 1 CAPSULE BY MOUTH EVERY DAY What changed: how much to take   folic acid 1 MG tablet Commonly known as: FOLVITE Take 2 tablets (2 mg total) by mouth daily. What changed:  how much to take when to take this   furosemide 20 MG tablet Commonly known as: Lasix Decrease Lasix ( furosemide)  to 20 mg ( 1/2/tablet)  , with 20 mg  you may take a an additional 20 mg if need for swelling or weight gain more than 3 lbs overnight or 5 lbs in one week -- if you have continue to take an additional 20 mg , go back to 40 mg  Take additional dose as needed for egt gain> 3 lb in 1 day What changed:  how much to take how to take this when to take  this reasons to take this additional instructions Another medication with the same name was removed. Continue taking this medication, and follow the directions you see here.   hydroxychloroquine 200 MG tablet Commonly known as: PLAQUENIL Take 200 mg by mouth 2 (two) times daily.   insulin lispro 100 UNIT/ML injection Commonly known as: HUMALOG Max Daily 100 units per pump What changed: additional instructions   isosorbide mononitrate 30 MG 24 hr tablet Commonly known as: IMDUR Take 30 mg by mouth daily.   Lancets 30G Misc 1 Device by Does not apply route 3 (three) times daily.   leflunomide 20 MG tablet Commonly known as: ARAVA Take 20 mg by mouth daily.   losartan 100 MG tablet Commonly known as: COZAAR Take 50 mg by mouth daily.   Melatonin 5 MG Chew Chew 5 mg by mouth at bedtime.   multivitamin with minerals Tabs tablet Take 1 tablet by mouth daily.   Omnipod 5 G6 Intro (Gen 5) Kit 1 Device by Does not apply route every other day.   Omnipod 5 G6 Pod (Gen 5) Misc 1 Device by Does not apply route every other day.   pantoprazole 40 MG tablet Commonly known as: PROTONIX Take 1 tablet (40 mg total) by mouth 2 (two) times daily before a meal. What changed: See the new instructions.   SAFETY-LOK TB SYRINGE 27GX.5" 27G X 1/2" 1 ML Misc Generic drug: TUBERCULIN SYR 1CC/27GX1/2"   sucralfate 1 g tablet Commonly known as: CARAFATE Take 1 tablet (1 g total) by mouth 2 (two) times daily for 14 days.   tirzepatide 5 MG/0.5ML Pen Commonly known as: MOUNJARO Inject 5 mg into the skin once a week.   traZODone 50 MG tablet Commonly known as: DESYREL Take 0.5-1 tablets (25-50 mg total) by mouth at bedtime as needed. for sleep What changed:  reasons to take this additional instructions   VICKS SINEX NA Place 1 spray into the nose daily as needed (congestion).        Discharge Exam: Filed Weights   09/04/22 1457 09/05/22 0520 09/06/22 0500  Weight: 109.8 kg  109.8 kg 108.7 kg   BP 135/62 (BP Location: Right Arm)   Pulse 74   Temp 97.9 F (36.6 C) (Oral)   Resp 20   Ht _0  (1.6 m)   Wt 108.7 kg   LMP  (LMP Unknown)   SpO2 98%   BMI 42.45 kg/m   Patient with no abdominal pain, no dyspnea  or chest pain.  Neurology awake and alert ENT with mild pallor Cardiovascular with S1 and S2 present and rhythmic with no gallops, rubs or murmurs Respiratory with no rales or wheezing, no rhonchi Abdomen with no distention  No lower extremity edema   Condition at discharge: stable  The results of significant diagnostics from this hospitalization (including imaging, microbiology, ancillary and laboratory) are listed below for reference.   Imaging Studies: DG Chest Port 1 View  Result Date: 09/04/2022 CLINICAL DATA:  Shortness of breath EXAM: PORTABLE CHEST 1 VIEW COMPARISON:  CXR 11/14/21, CT Chest 06/23/21 FINDINGS: No pleural effusion. No pneumothorax. Normal cardiac and mediastinal contours. No focal airspace opacity. No displaced rib fractures. The upper abdomen is poorly visualized. IMPRESSION: No focal airspace opacity. Electronically Signed   By: Marin Roberts M.D.   On: 09/04/2022 17:15    Microbiology: Results for orders placed or performed during the hospital encounter of 11/13/21  Resp Panel by RT-PCR (Flu A&B, Covid) Nasopharyngeal Swab     Status: None   Collection Time: 11/13/21 10:00 PM   Specimen: Nasopharyngeal Swab; Nasopharyngeal(NP) swabs in vial transport medium  Result Value Ref Range Status   SARS Coronavirus 2 by RT PCR NEGATIVE NEGATIVE Final    Comment: (NOTE) SARS-CoV-2 target nucleic acids are NOT DETECTED.  The SARS-CoV-2 RNA is generally detectable in upper respiratory specimens during the acute phase of infection. The lowest concentration of SARS-CoV-2 viral copies this assay can detect is 138 copies/mL. A negative result does not preclude SARS-Cov-2 infection and should not be used as the sole basis for  treatment or other patient management decisions. A negative result may occur with  improper specimen collection/handling, submission of specimen other than nasopharyngeal swab, presence of viral mutation(s) within the areas targeted by this assay, and inadequate number of viral copies(<138 copies/mL). A negative result must be combined with clinical observations, patient history, and epidemiological information. The expected result is Negative.  Fact Sheet for Patients:  EntrepreneurPulse.com.au  Fact Sheet for Healthcare Providers:  IncredibleEmployment.be  This test is no t yet approved or cleared by the [place] FDA and  has been authorized for detection and/or diagnosis of SARS-CoV-2 by FDA under an Emergency Use Authorization (EUA). This EUA will remain  in effect (meaning this test can be used) for the duration of the COVID-19 declaration under Section 564(b)(1) of the Act, 21 U.S.C.section 360bbb-3(b)(1), unless the authorization is terminated  or revoked sooner.       Influenza A by PCR NEGATIVE NEGATIVE Final   Influenza B by PCR NEGATIVE NEGATIVE Final    Comment: (NOTE) The Xpert Xpress SARS-CoV-2/FLU/RSV plus assay is intended as an aid in the diagnosis of influenza from Nasopharyngeal swab specimens and should not be used as a sole basis for treatment. Nasal washings and aspirates are unacceptable for Xpert Xpress SARS-CoV-2/FLU/RSV testing.  Fact Sheet for Patients: EntrepreneurPulse.com.au  Fact Sheet for Healthcare Providers: IncredibleEmployment.be  This test is not yet approved or cleared by the [place] FDA and has been authorized for detection and/or diagnosis of SARS-CoV-2 by FDA under an Emergency Use Authorization (EUA). This EUA will remain in effect (meaning this test can be used) for the duration of the COVID-19 declaration under Section 564(b)(1) of the Act, 21  U.S.C. section 360bbb-3(b)(1), unless the authorization is terminated or revoked.  Performed at [facility], [place] [address] [place], [place] [address]     Labs: CBC: Recent Labs  Lab 09/04/22 1514 09/04/22 2224 09/05/22  0421 09/05/22 1240 09/06/22 0421  WBC 8.5  --   --   --   --   NEUTROABS 5.9  --   --   --   --   HGB 7.4* 6.9* 6.9* 8.9* 8.7*  HCT 25.2* 24.1* 23.3* 29.5* 28.8*  MCV 64.9*  --   --   --   --   PLT 237  --   --   --   --    Basic Metabolic Panel: Recent Labs  Lab 09/04/22 1514 09/05/22 0421 09/06/22 0421  NA 143 143 138  K 3.8 3.3* 3.8  CL 103 103 99  CO2 _0 GLUCOSE 125* 161* 228*  BUN _1 CREATININE 1.01* 1.00 1.10*  CALCIUM 9.4 8.9 8.7*  MG  --  2.0  --   PHOS  --  3.6  --    Liver Function Tests: Recent Labs  Lab 09/04/22 1514  AST 24  ALT 19  ALKPHOS 78  BILITOT 0.4  PROT 7.9  ALBUMIN 4.0   CBG: Recent Labs  Lab 09/05/22 1127 09/05/22 1504 09/05/22 1828 09/05/22 2122 09/06/22 0745  GLUCAP 138* 113* 197* 302* 216*    Discharge time spent: greater than 30 minutes.  Signed: Tawni Millers, MD [facility] 09/06/2022

## 2022-09-07 ENCOUNTER — Encounter (HOSPITAL_COMMUNITY): Payer: Self-pay | Admitting: Gastroenterology

## 2022-09-10 ENCOUNTER — Telehealth: Payer: Self-pay | Admitting: *Deleted

## 2022-09-10 ENCOUNTER — Encounter: Payer: Self-pay | Admitting: *Deleted

## 2022-09-10 NOTE — Patient Outreach (Signed)
  Care Coordination [facility] Note Transition Care Management Follow-up Telephone Call Date of discharge and from where: Thursday, 09/07/23 [facility]; GI bleed; symptomatic anemia How have you been since you were released from the hospital? "I am doing really great; I feel fine and I have my doctor appointment all set up for this Wednesday.  I am able to do everything I need to to care for myself, but my husband is right here, he is very good to me and helps with anything I ask or need.  My bowels are back to normal and I have re-started the blood thinner like they told me to.  Everything is falling into place" Any questions or concerns? No  Items Reviewed: Did the pt receive and understand the discharge instructions provided? Yes  Medications obtained and verified? Yes  Other? No  Any new allergies since your discharge? No  Dietary orders reviewed? Yes Do you have support at home? Yes  spouse provides assistance with care needs as indicated  Home Care and Equipment/Supplies: Were home health services ordered? no If so, what is the name of the agency? N/A  Has the agency set up a time to come to the patient's home? not applicable Were any new equipment or medical supplies ordered?  No What is the name of the medical supply agency? N/A Were you able to get the supplies/equipment? not applicable Do you have any questions related to the use of the equipment or supplies? No N/A  Functional Questionnaire: (I = Independent and D = Dependent) ADLs: I  spouse provides assistance with care needs as indicated  Bathing/Dressing- I  Meal Prep- I  spouse provides assistance with care needs as indicated  Eating- I  Maintaining continence- I  Transferring/Ambulation- I  Managing Meds- I  Follow up appointments reviewed:  PCP Hospital f/u appt confirmed? Yes  Scheduled to see PCP on Wednesday 09/12/22 @ 3:20 pm Specialist Hospital f/u appt confirmed? No  Scheduled to see - on - @ - Are  transportation arrangements needed? No  If their condition worsens, is the pt aware to call PCP or go to the Emergency Dept.? Yes Was the patient provided with contact information for the PCP's office or ED? No- patient confirms she has contact numbers for care providers Was to pt encouraged to call back with questions or concerns? Yes  SDOH assessments and interventions completed:   Yes  Care Coordination Interventions Activated:  Yes   Care Coordination Interventions:  Provided education around signs/ symptoms GI bleeding along with corresponding action plan; discussed new medications post-hospital discharge and provided education around purpose of new medications; provided education around diet appropriate for GI bleeding; reviewed/ provided education around specific endoscopy procedure performed during hospitalization; coached patient on talking points to cover about hospitalization at upcoming PCP office visit    Encounter Outcome:  Pt. Visit Completed    Oneta Rack, RN, BSN, CCRN Alumnus RN CM Care Coordination/ Transition of [place] Management [phone]: direct office

## 2022-09-11 NOTE — Patient Instructions (Incomplete)
It was good to see you again today, I am glad you are feeling better.  I will be in touch with your labs  Recommend the latest COVID-19 booster, Shingrix series at your pharmacy

## 2022-09-11 NOTE — Progress Notes (Unsigned)
[place] at [facility] [address] [place], [place] [address] [phone]  Date:  09/12/2022   Name:  Lynn [name]   DOB:  Jun 07, 1956   MRN:  [id number]  PCP:  [name], MD    Chief Complaint: No chief complaint on file.   History of Present Illness:  Lynn [name] is a 66 y.o. very pleasant female patient who presents with the following:  Patient seen today for follow-up from recent hospitalization Last seen by myself in April- -history of paroxysmal atrial tach and fibrillation, on diltiazem and Eliquis, interstitial lung disease and COPD on oxygen, diabetes, history of CVA, rheumatoid arthritis, hypertension, thalassemia-I am unsure what type of thalassemia    Admit date:     09/04/2022  Discharge date: 09/06/22  Discharge Physician: Jimmy Picket Arrien    PCP: [name], MD    Recommendations at discharge:   Patient with no further bleeding, plan to resume apixaban on 09/08/22 Continue with pantoprazole 40 mg po bid and added sucralfate 1 g bid for 2 weeks.  If patient develops transfusion dependent anemia, will need colonoscopy and video capsule endoscopy as next steps.    Discharge Diagnoses: Principal Problem:   Acute upper GI bleed Active Problems:   Essential hypertension   Chronic diastolic CHF (congestive heart failure) (HCC)   Type 2 diabetes mellitus with hyperlipidemia (HCC)   PAF (paroxysmal atrial fibrillation) (HCC); CHA2DS2-VASc score 7   Class 3 obesity (HCC)   ILD (interstitial lung disease) [facility]) Hospital Course: Mrs. Lowden was admitted to the hospital with the working diagnosis of symptomatic anemia.   66 yo female with the past medical history of anxiety, coronary artery disease, heart failure, hypertension, dyslipidemia, thalassemia, COPD, ILD, and atrial fibrillation who was referred from the outpatient clinical for worsening anemia. She had  persistent melena for 2 weeks, associated with fatigue, dyspnea, chest pain and palpitations, her hgb was noted to be 7,4 from 8,9.  On her initial physical examination her blood pressure was 127/69, HR 78, RR 18 and 02 saturation 100%, lungs with no wheezing or rales, heart with S1 and S2 present and rhythmic, abdomen with no distention, no lower extremity edema.   Patient had 2 units PRBC transfused with good toleration.  11/22 EGD with two non bleeding angiodysplastic lesions in the stomach. Treated with argon plasma coagulation. Single non bleeding angiodysplastic lesion in the proximal jejunum.  Follow up hgb has been stable, patient will continue with antiacid therapy as outpatient.    Assessment and Plan: * Acute upper GI bleed Patient with melena and lower worsening anemia Sp 2 units PRBC transfusion with follow up Hgb at 8.7   11/22  EGD with no gross lesions in the esophagus, two non bleeding angiodysplastic lesions in the stomach.  Single non bleeding angiodysplastic lesion in the proximal jejunum.  Lesions treated with argon plasma coagulation.    If patient develops transfusion dependent anemia, will need outpatient colonoscopy plus VCE as next steps.    Acute blood loss anemia due to upper GI bleed, patient had 2 units PRBC transfusion with good toleration, her follow hgb is stable with no signs of further bleeding.  Essential hypertension Continue blood pressure control with losartan, isosorbide and diltiazem.   Chronic diastolic CHF (congestive heart failure) ([place]) Patient had IV furosemide in the setting of PRBC transfusions to prevent volume overload. At the time of her discharge with no signs of  decompensated heart failure.  She will continue diltiazem, losartan and furosemide.  Type 2 diabetes mellitus with hyperlipidemia (HCC) Patient was placed on insulin sliding scale during her hospitalization, her glucose remained well controlled.  At the time of discharge will  resume her home diabetic regimen.  PAF (paroxysmal atrial fibrillation) ([place]); CHA2DS2-VASc score 7 Continue rate control atrial fibrillation with diltiazem.  Anticoagulation was held during her hospitalization due to acute anemia. Plan to resume apixaban on 09/08/22.  Class 3 obesity (HCC) Calculated BMI is 42.8  ILD (interstitial lung disease) ([place]) Recent diagnosis, continue with home 02 supplementation.  Will need to continue follow up as outpatient.  Thalassemia Hgb stable, follow up as outpatient Iron panel with serum iron at 39, TIBC with 297, transferrin saturation 13 and ferritin 244, consistent with mild iron deficiency anaemia combined with anemia of chronic disease. Follow up as outpatient.  Rheumatoid arthritis ([place]) Continue with hydroxychloroquine.   Flu vaccine Recommend COVID, Shingrix  Patient Active Problem List   Diagnosis Date Noted   Acute upper GI bleed 09/04/2022   IDA (iron deficiency anemia) 02/09/2022   Acute on chronic respiratory failure with hypoxia ([place]) 11/14/2021   AKI (acute kidney injury) ([place])    Syncope 11/13/2021   Acute gastric ulcer without hemorrhage or perforation    Ulcer of esophagus without bleeding    PAF (paroxysmal atrial fibrillation) ([place]); CHA2DS2-VASc score 7 07/05/2021   Anemia    Acute gastric ulcer with hemorrhage    Occult GI bleeding    Melena    Preop respiratory exam    ILD (interstitial lung disease) (HCC)    SOB (shortness of breath) 03/24/2021   FUO (fever of unknown origin) 03/24/2021   Hypokalemia 03/24/2021   Generalized weakness 03/24/2021   Coronary artery spasm ([place]) 08/09/2020   Rheumatoid arthritis ([place]) 11/09/2019   Toe pain, bilateral 05/26/2019   Type 2 diabetes mellitus with hyperlipidemia ([place]) 05/26/2019   Mobility impaired 04/01/2019   Class 3 obesity ([place]) 12/03/2017   DOE (dyspnea on exertion) 12/02/2017   Moderate mitral stenosis by prior echocardiography 08/23/2017   Chronic diastolic CHF  (congestive heart failure) ([place]) 08/12/2017   Chronic respiratory failure with hypoxia ([place]) 08/12/2017   OSA on CPAP 08/12/2017   COPD with acute bronchitis ([place]) 08/12/2017   Near syncope 07/03/2017   Anxiety 07/03/2017   Coronary artery disease, non-occlusive 12/08/2016   PAT (paroxysmal atrial tachycardia)    H/O total knee replacement 08/19/2015   Vision, loss, sudden 03/07/2015   S/P TKR (total knee replacement) using cement 02/18/2015   Thalassemia 09/02/2013   Hyperlipidemia associated with type 2 diabetes mellitus ([place]) 08/24/2012   Essential hypertension 07/22/2012    Past Medical History:  Diagnosis Date   Anxiety    Clotting disorder ([place])    blood clot in eye 2016   Coronary artery disease, non-occlusive    a. 11/2016 NSTEMI/Cath: LM nl, LAD 40p, 37m, D1/2 small, LCX 40ost, OM2/3 nl/small, RCA 35 ost/mid, RPDA/RPL small/nl.   Coronary vasospasm (HCC)    Depression    Diastolic dysfunction    a. 11/2016 Echo: EF 55-60%, gr1 DD, sev calcified MV annulus, mildly dil LA.   Eye hemorrhage 01/2015   "right; resolved" (07/03/2017)   Headache    "monthly" (07/03/2017)   History of blood transfusion    "when I had an ectopic pregnancy"   History of stomach ulcers    Hyperlipidemia    Hypertension    Microcytic anemia    Moderate mitral  stenosis by prior echocardiogram 03/2021   TTE: Mod MS (mean gradient 9 mmHg). -- TEE Moderate-Severe MS (mean gradient 14 mmHg) with fixed Post MV Leaflet & Severe MAC w/ large circumscribed calcified mass on the Post MV Leaflet. (Mass previously described in 2018) - CMRI identifies calcified mass & not Tumor.; R&LHC - LVEDP & PCWP both 23 mmHg indicating minimal resting gradient   OSA on CPAP    setting is unknown   PAF (paroxysmal atrial fibrillation) ([place]) 03/2021   Event Monitor Aug-Sept 2022: Predominantly sinus rhythm.  1% A. fib burden.  30 brief episodes of PAT.  2 short bursts of NSVT.   PAT (paroxysmal atrial tachycardia)     Event monitor from August 2022 showed 30 brief bursts longest 14 seconds.   Pneumonia    "several times" (07/03/2017)   PVC's (premature ventricular contractions)    Rheumatoid arthritis ([place])    Syncope 07/03/2017 X 2   called seizures but no medications   Thalassemia    "my cells are sickle cell shaped but I don't have sickle cell anemia" (07/03/2017)   Type II diabetes mellitus ([place])     Past Surgical History:  Procedure Laterality Date   48-Hour Monitor  03/18/2017   Sinus rhythm with sinus tachycardia (rate 58-134 BPM)multiple PVCs noted with couplets and bigeminy. One triplet. 7 runs of PAT ranging from 100-130 bpm. Longest was 33 beats.   BIOPSY  04/03/2021   Procedure: BIOPSY;  Surgeon: Yetta Flock, MD;  Location: [facility];  Service: Gastroenterology;;   BREAST BIOPSY Left    BRONCHIAL WASHINGS  03/28/2021   Procedure: BRONCHIAL WASHINGS;  Surgeon: Julian Hy, DO;  Location: [facility];  Service: Endoscopy;;   CARDIAC CATHETERIZATION N/A 11/19/2016   Procedure: Left Heart Cath and Coronary Angiography;  Surgeon: Belva Crome, MD;  Location: [facility];  Service: Cardiovascular.    LM nl, LAD 40p, 71m, D1/2 small, LCX 40ost, OM2/3 nl/small, RCA 35 ost/mid, RPDA/RPL small/nl.   CARDIAC MRI  03/30/2021   Normal LVEF ~53%. 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 No Scar or Late Gadolinium Enhancement on LV Myocardium.   CARPAL TUNNEL RELEASE Right 11/01/2014   COLONOSCOPY     DILATION AND CURETTAGE OF UTERUS     ECTOPIC PREGNANCY SURGERY  X 2   ENTEROSCOPY N/A 09/05/2022   Procedure: ENTEROSCOPY;  Surgeon: MRush LandmarkGTelford Nab, MD;  [facility];  Service: Gastroenterology;  Laterality: N/A;   ESOPHAGOGASTRODUODENOSCOPY (EGD) WITH PROPOFOL N/A 04/03/2021   Procedure: ESOPHAGOGASTRODUODENOSCOPY (EGD) WITH PROPOFOL;  Surgeon: AYetta Flock MD;  Location: [facility];  Service:  Gastroenterology;  Laterality: N/A;   ESOPHAGOGASTRODUODENOSCOPY (EGD) WITH PROPOFOL N/A 08/15/2021   Procedure: ESOPHAGOGASTRODUODENOSCOPY (EGD) WITH PROPOFOL;  Surgeon: SLadene Artist MD;  Location: [facility];  Service: Endoscopy;  Laterality: N/A;   HOT HEMOSTASIS N/A 09/05/2022   Procedure: HOT HEMOSTASIS (ARGON PLASMA COAGULATION/BICAP);  Surgeon: MIrving Copas, MD;  [facility];  Service: Gastroenterology;  Laterality: N/A;   JOINT REPLACEMENT     KNEE ARTHROSCOPY Left 11/2014   RIGHT/LEFT HEART CATH AND CORONARY ANGIOGRAPHY N/A 08/08/2021   Procedure: RIGHT/LEFT HEART CATH AND CORONARY ANGIOGRAPHY;  Surgeon: HLeonie Man MD;  [facility];  Service: Cardiovascular;Ost LCx 40%. Ost-Prox RCA 35%. Prox-Mid LAD 25%-<25% D2. Mod-Severely Elevated LVEDP. Mild PA HTN -> PA mean 31 mmHg with a PCWP and LVEDP of 23 mmHg   SUBMUCOSAL TATTOO  INJECTION  09/05/2022   Procedure: SUBMUCOSAL TATTOO INJECTION;  Surgeon: Irving Copas., MD;  Location: [facility];  Service: Gastroenterology;;   TEE WITHOUT CARDIOVERSION N/A 03/28/2021   Procedure: TRANSESOPHAGEAL ECHOCARDIOGRAM (TEE);  Surgeon: Thayer Headings, MD;  Location: [facility];; LVEF 55-60%. Normal LV Fxn & no RWMA. No LAA thrombus. Gr II Ao Plaque. Large circumscribed calcific mass (1.8 x 1.4 cm) anterior to posterior annulus.  Fixed posterior leaflet with Mod-Severe MS (Mean MVG ~14 mmHg, Peak 20 mmhg).  Mild MR   TONSILLECTOMY     TOTAL KNEE ARTHROPLASTY Right 02/18/2015   Procedure: RIGHT TOTAL KNEE ARTHROPLASTY;  Surgeon: Netta Cedars, MD;  Location: [facility];  Service: Orthopedics;  Laterality: Right;   TOTAL KNEE ARTHROPLASTY Left 08/19/2015   Procedure: LEFT TOTAL KNEE ARTHROPLASTY;  Surgeon: Netta Cedars, MD;  Location: [facility];  Service: Orthopedics;  Laterality: Left;   TRANSTHORACIC ECHOCARDIOGRAM  11/2016; 07/2017:   a) normal LV size, thickness and function. EF 55-60%. GR 1  DD.No RWMA severe mitral annular calcification, but no mitral stenosis. Mild LA dilation;; b) normal LV size and function.  EF 65-75%.  Unable to assess diastolic function.  Aortic sclerosis with no stenosis.  Moderate mitral stenosis? (Gradient 9 mmHg) -?  Mild-mod left atrial enlargement    TRANSTHORACIC ECHOCARDIOGRAM  01/2019   EF 65 to 70%.  Normal function.  Elevated LVEDP-GR 1 DD.  Normal RV size and function.  Large calcific mass on posterior mitral leaflet mild to moderate mitral stenosis.   TRANSTHORACIC ECHOCARDIOGRAM  03/24/2021   EF 60 to 65%.  LV function with normal wall motion.  Moderate basal septal LVH.  GRII DD & Mild LA dilation.-> elevated LVEDP.  Normal RV function.  Mildly elevated PAP.  Ill-defined density measuring 3.23 x 2.1 cm region of the posterior MV leaflet along with dense MAC.  Moderate MS (mean MVG of 9 mmHg.) Normal AoV & RAP. --> Recommend TEE 2/2 concern for MV SBE.   TRANSTHORACIC ECHOCARDIOGRAM  04/26/2022   EF 60 to 65%.  No RWMA.  Moderate LVH with GR 1 DD.  Normal RV size and function.  Mild LA dilation.  Large calcific mass of posterior MV leaflet.  Mean PG 13 mm 3.  Trivial MR.  Mild to moderate MS.  Severe MAC.  Normal AOV.  Normal RAP.   VAGINAL HYSTERECTOMY     VIDEO BRONCHOSCOPY N/A 03/28/2021   Procedure: VIDEO BRONCHOSCOPY WITHOUT FLUORO;  Surgeon: Julian Hy, DO;  Location: [facility];  Service: Endoscopy;  Laterality: N/A;    Social History   Tobacco Use   Smoking status: Former    Packs/day: 0.25    Years: 44.00    Total pack years: 11.00    Types: Cigarettes    Quit date: 02/17/2015    Years since quitting: 7.5   Smokeless tobacco: Never  Vaping Use   Vaping Use: Never used  Substance Use Topics   Alcohol use: Not Currently    Comment: 07/03/2017 "might have a few drinks/year"   Drug use: No    Family History  Problem Relation Age of Onset   Cancer Mother    Diabetes Mother    Hypertension Mother    Hyperlipidemia Mother     Cancer Father    Hyperlipidemia Father    Mental illness Sister    Diabetes Sister    Diabetes Maternal Grandmother    Diabetes Maternal Grandfather    Colon cancer Neg Hx    Esophageal  cancer Neg Hx    Stomach cancer Neg Hx    Rectal cancer Neg Hx    Tremor Neg Hx    Parkinson's disease Neg Hx     Allergies  Allergen Reactions   Methotrexate Other (See Comments)    Pneumonitis   Penicillins Hives    Childhood allergy Has patient had a PCN reaction causing immediate rash, facial/tongue/throat swelling, SOB or lightheadedness with hypotension: Yes Has patient had a PCN reaction causing severe rash involving mucus membranes or skin necrosis: No Has patient had a PCN reaction that required hospitalization No Has patient had a PCN reaction occurring within the last 10 years: No If all of the above answers are "NO", then may proceed with Cephalosporin use.   [name] [Dapagliflozin] Other (See Comments)    Dizzy and lethargic    Metformin And Related Other (See Comments)    Diarrhea, bleeding   Milk [Milk ([place]ow)] Diarrhea    Milk and dairy products    Medication list has been reviewed and updated.  Current Outpatient Medications on File Prior to Visit  Medication Sig Dispense Refill   acetaminophen (TYLENOL) 500 MG tablet Take 500 mg by mouth every 6 (six) hours as needed for fever or moderate pain.     albuterol (VENTOLIN HFA) 108 (90 Base) MCG/ACT inhaler Inhale 2 puffs into the lungs every 6 (six) hours as needed for wheezing or shortness of breath. (Patient taking differently: Inhale 2 puffs into the lungs every other day.) 8 g 2   apixaban (ELIQUIS) 5 MG TABS tablet Take 1 tablet (5 mg total) by mouth 2 (two) times daily. Resume taking on 09/08/22 160 tablet 3   atorvastatin (LIPITOR) 80 MG tablet Take 1 tablet (80 mg total) by mouth every evening. 90 tablet 3   cetirizine (ZYRTEC) 10 MG tablet Take 10 mg by mouth daily as needed for allergies.     Continuous Blood Gluc  Sensor (DEXCOM G6 SENSOR) MISC 1 Device by Does not apply route as directed. 3 each 6   Continuous Blood Gluc Transmit (DEXCOM G6 TRANSMITTER) MISC 1 Device by Does not apply route as directed. 1 each 3   diltiazem (CARDIZEM CD) 360 MG 24 hr capsule TAKE 1 CAPSULE BY MOUTH EVERY DAY (Patient taking differently: Take 360 mg by mouth daily.) 90 capsule 3   folic acid (FOLVITE) 1 MG tablet Take 2 tablets (2 mg total) by mouth daily. (Patient taking differently: Take 1 mg by mouth in the morning and at bedtime.) 180 tablet 3   furosemide (LASIX) 20 MG tablet Decrease Lasix ( furosemide)  to 20 mg ( 1/2/tablet)  , with 20 mg  you may take a an additional 20 mg if need for swelling or weight gain more than 3 lbs overnight or 5 lbs in one week -- if you have continue to take an additional 20 mg , go back to 40 mg  Take additional dose as needed for egt gain> 3 lb in 1 day (Patient taking differently: Take 20 mg by mouth daily as needed for fluid.)     glucose blood (CONTOUR NEXT TEST) test strip 3x daily 300 each 11   hydroxychloroquine (PLAQUENIL) 200 MG tablet Take 200 mg by mouth 2 (two) times daily.     Insulin Disposable Pump (OMNIPOD 5 G6 INTRO, GEN 5,) KIT 1 Device by Does not apply route every other day. 1 kit 0   Insulin Disposable Pump (OMNIPOD 5 G6 POD, GEN 5,) MISC 1 Device by  Does not apply route every other day. 30 each 3   insulin lispro (HUMALOG) 100 UNIT/ML injection Max Daily 100 units per pump (Patient taking differently: Max Daily 100 units per pump per patient) 90 mL 3   Insulin Pen Needle (B-D UF III MINI PEN NEEDLES) 31G X 5 MM MISC USE DAILY WITH VICTOZA AND LANTUS. (Patient taking differently: Use for ozempic) 400 each 1   Insulin Pen Needle 31G X 5 MM MISC 1 Device by Does not apply route 3 (three) times daily. 150 each 11   isosorbide mononitrate (IMDUR) 30 MG 24 hr tablet Take 30 mg by mouth daily.     Lancets 30G MISC 1 Device by Does not apply route 3 (three) times daily. 300 each  9   leflunomide (ARAVA) 20 MG tablet Take 20 mg by mouth daily.     losartan (COZAAR) 100 MG tablet Take 50 mg by mouth daily.     Melatonin 5 MG CHEW Chew 5 mg by mouth at bedtime.     Multiple Vitamin (MULTIVITAMIN WITH MINERALS) TABS tablet Take 1 tablet by mouth daily.     Oxymetazoline HCl (VICKS SINEX NA) Place 1 spray into the nose daily as needed (congestion).     pantoprazole (PROTONIX) 40 MG tablet Take 1 tablet (40 mg total) by mouth 2 (two) times daily before a meal. 60 tablet 0   SAFETY-LOK TB SYRINGE 27GX.5" 27G X 1/2" 1 ML MISC      sucralfate (CARAFATE) 1 g tablet Take 1 tablet (1 g total) by mouth 2 (two) times daily for 14 days. 28 tablet 0   tirzepatide (MOUNJARO) 5 MG/0.5ML Pen Inject 5 mg into the skin once a week. 6 mL 2   traZODone (DESYREL) 50 MG tablet Take 0.5-1 tablets (25-50 mg total) by mouth at bedtime as needed. for sleep (Patient taking differently: Take 25-50 mg by mouth at bedtime as needed for sleep.) 90 tablet 2   No current facility-administered medications on file prior to visit.    Review of Systems:  As per HPI- otherwise negative.   Physical Examination: There were no vitals filed for this visit. There were no vitals filed for this visit. There is no height or weight on file to calculate BMI. Ideal Body Weight:    GEN: no acute distress. HEENT: Atraumatic, Normocephalic.  Ears and Nose: No external deformity. CV: RRR, No M/G/R. No JVD. No thrill. No extra heart sounds. PULM: CTA B, no wheezes, crackles, rhonchi. No retractions. No resp. distress. No accessory muscle use. ABD: S, NT, ND, +BS. No rebound. No HSM. EXTR: No c/c/e PSYCH: Normally interactive. Conversant.    Assessment and Plan: ***  Signed Lamar Blinks, MD

## 2022-09-12 ENCOUNTER — Ambulatory Visit (INDEPENDENT_AMBULATORY_CARE_PROVIDER_SITE_OTHER): Payer: Medicare Other | Admitting: Family Medicine

## 2022-09-12 ENCOUNTER — Ambulatory Visit: Payer: Medicare Other | Admitting: Family Medicine

## 2022-09-12 VITALS — BP 116/64 | HR 72 | Temp 97.8°F | Resp 20 | Ht 63.0 in | Wt 236.2 lb

## 2022-09-12 DIAGNOSIS — I10 Essential (primary) hypertension: Secondary | ICD-10-CM

## 2022-09-12 DIAGNOSIS — K922 Gastrointestinal hemorrhage, unspecified: Secondary | ICD-10-CM

## 2022-09-12 DIAGNOSIS — D569 Thalassemia, unspecified: Secondary | ICD-10-CM | POA: Diagnosis not present

## 2022-09-12 DIAGNOSIS — I480 Paroxysmal atrial fibrillation: Secondary | ICD-10-CM

## 2022-09-12 DIAGNOSIS — N189 Chronic kidney disease, unspecified: Secondary | ICD-10-CM | POA: Diagnosis not present

## 2022-09-13 ENCOUNTER — Encounter: Payer: Self-pay | Admitting: Family Medicine

## 2022-09-13 LAB — BASIC METABOLIC PANEL
BUN: 13 mg/dL (ref 6–23)
CO2: 30 mEq/L (ref 19–32)
Calcium: 9.2 mg/dL (ref 8.4–10.5)
Chloride: 101 mEq/L (ref 96–112)
Creatinine, Ser: 1.17 mg/dL (ref 0.40–1.20)
GFR: 47.9 mL/min — ABNORMAL LOW (ref >60.00)
Glucose, Bld: 120 mg/dL — ABNORMAL HIGH (ref 70–99)
Potassium: 3.3 mEq/L — ABNORMAL LOW (ref 3.5–5.1)
Sodium: 141 mEq/L (ref 135–145)

## 2022-09-13 LAB — CBC
HCT: 30.7 % — ABNORMAL LOW (ref 36.0–46.0)
Hemoglobin: 9.7 g/dL — ABNORMAL LOW (ref 12.0–15.0)
MCHC: 31.7 g/dL (ref 30.0–36.0)
MCV: 65.1 fl — ABNORMAL LOW (ref 78.0–100.0)
Platelets: 279 10*3/uL (ref 150.0–400.0)
RBC: 4.71 Mil/uL (ref 3.87–5.11)
RDW: 22.4 % — ABNORMAL HIGH (ref 11.5–15.5)
WBC: 7.4 10*3/uL (ref 4.0–10.5)

## 2022-09-14 ENCOUNTER — Telehealth: Payer: Self-pay | Admitting: Internal Medicine

## 2022-09-14 NOTE — Telephone Encounter (Signed)
Staff messages posted below: Dierdre Highman, RN  Kassiah Mccrory, Waldemar Dickens, CMA Raquel Sarna this is a MR pt.       Previous Messages    ----- Message ----- From: Collene Gobble, MD Sent: 09/12/2022   5:11 PM EST To: Dierdre Highman, RN Subject: Seward Carol, can we get her into see me or an APP? Thanks.  ----- Message ----- From: [name], MD Sent: 09/12/2022   3:50 PM EST To: Collene Gobble, MD  Hi Herbie [place]- could you ask your staff to reach out to [facility]?  She feels like her chronic lung disease is progressing and wanted to see you   thank you!    JC     Tried to call pt to get her scheduled for an appt with MR or an APP but unable to reach. Left message for her to return call.

## 2022-09-19 ENCOUNTER — Encounter: Payer: Self-pay | Admitting: Cardiology

## 2022-09-19 DIAGNOSIS — E1169 Type 2 diabetes mellitus with other specified complication: Secondary | ICD-10-CM

## 2022-09-19 DIAGNOSIS — I214 Non-ST elevation (NSTEMI) myocardial infarction: Secondary | ICD-10-CM

## 2022-09-19 MED ORDER — ISOSORBIDE MONONITRATE ER 30 MG PO TB24: 30.0000 mg | ORAL_TABLET | Freq: Every day | ORAL | 3 refills | Status: DC

## 2022-09-19 MED ORDER — ATORVASTATIN CALCIUM 80 MG PO TABS: 80.0000 mg | ORAL_TABLET | Freq: Every evening | ORAL | 3 refills | Status: DC

## 2022-09-19 MED ORDER — LOSARTAN POTASSIUM 100 MG PO TABS: 50.0000 mg | ORAL_TABLET | Freq: Every day | ORAL | 3 refills | Status: DC

## 2022-09-19 MED ORDER — APIXABAN 5 MG PO TABS: 5.0000 mg | ORAL_TABLET | Freq: Two times a day (BID) | ORAL | 3 refills | Status: DC

## 2022-09-19 MED ORDER — DILTIAZEM HCL ER COATED BEADS 360 MG PO CP24: 360.0000 mg | ORAL_CAPSULE | Freq: Every day | ORAL | 3 refills | Status: DC

## 2022-09-21 LAB — HISTOPLASMA ANTIGEN, URINE: Histoplasma Antigen, urine: <0.5 (ref <0.5)

## 2022-10-05 NOTE — Telephone Encounter (Signed)
Patient is scheduled 10/25/2022 at 11am with Roxan Diesel, NP. Nothing further needed.

## 2022-10-05 NOTE — Telephone Encounter (Signed)
lvmtcb to sch on mobile #, line busy on home #. Also sent mychart message for patient to contact the office if she wishes to schedule.

## 2022-10-12 ENCOUNTER — Ambulatory Visit: Payer: Medicare Other | Admitting: Internal Medicine

## 2022-10-12 ENCOUNTER — Encounter: Payer: Self-pay | Admitting: Internal Medicine

## 2022-10-12 VITALS — BP 122/78 | HR 79 | Ht 63.0 in | Wt 242.0 lb

## 2022-10-12 DIAGNOSIS — Z794 Long term (current) use of insulin: Secondary | ICD-10-CM | POA: Diagnosis not present

## 2022-10-12 DIAGNOSIS — E1139 Type 2 diabetes mellitus with other diabetic ophthalmic complication: Secondary | ICD-10-CM | POA: Diagnosis not present

## 2022-10-12 DIAGNOSIS — E1159 Type 2 diabetes mellitus with other circulatory complications: Secondary | ICD-10-CM | POA: Diagnosis not present

## 2022-10-12 DIAGNOSIS — E1165 Type 2 diabetes mellitus with hyperglycemia: Secondary | ICD-10-CM | POA: Diagnosis not present

## 2022-10-12 MED ORDER — TIRZEPATIDE 7.5 MG/0.5ML ~~LOC~~ SOAJ: 7.5000 mg | SUBCUTANEOUS | 3 refills | Status: DC

## 2022-10-12 NOTE — Progress Notes (Signed)
Name: Lynn [name]  Age/ Sex: 66 y.o., female   MRN/ DOB: [id number], December 12, 1955     PCP: Lynn Mclean, MD   Reason for Endocrinology Evaluation: Type 2 Diabetes Mellitus  Initial Endocrine Consultative Visit: 03/31/2019    [name] IDENTIFIER: Lynn [name] is a 66 y.o. female with a past medical history of T2DM, HTN, dyslipidemia,thalassemia, OSA on CPAP . The [name] has followed with Endocrinology clinic since 03/31/2019 for consultative assistance with management of her diabetes.  DIABETIC HISTORY:  Lynn [name] was diagnosed with T2DM in 2010, She has been on metformin in the past with reported prior intolerance due to diarrhea. Was on Glipizide at some point with no side effects.Has been on insulin for years. Her hemoglobin A1c has ranged from  7.7% in 2018, peaking at 10.4% in 2020.  On her initial presentation to our clinic, her A1c was 10.4% , she was on Lantus and Victoza    [name] was tried in 03/2019 but developed severe dizziness .     Injectable methotrexate was started November 2020 due to rheumatoid arthritis.  [name] is intolerant to oral methotrexate   Started on Omnipod 10/2020  Switched Ozempic to [facility] 08/2022 due to shortage of supply   SUBJECTIVE:   During the last visit (03/18/2021): A1c 11.2 % we continued  Lantus and  Victoza, restarted Humalog      Today (10/12/2022): Lynn [name] is here for a month follow up on diabetes management. She has the Omnipod. But was recently discharged from the hospital and Lynn disrupted the pump use.  [name] presented to the ED 09/04/2022 for symptomatic anemia, she is s/p 2 packs of PRBC transfusion and EGD Weakness has improved   Denies nausea, vomiting or diarrhea  We switched Ozemic to  Skin has been dry with the callus formation   Lynn [name] with type 2 diabetes is treated with Omnipod (insulin pump). During the visit the pump basal and bolus doses were reviewed including  carb/insulin rations and supplemental doses. The clinical list was updated. The glucose meter download was reviewed in detail to determine if the current pump settings are providing the best glycemic control without excessive hypoglycemia.  Pump and meter download:    Pump   Omnipod Settings   Insulin type   Humalog    Basal rate       0000  2.3 u/h    0930 2.6          I:C ratio       0000 1:9   11:30 1:8              Sensitivity       0000  10      Goal       0000  110            Type & Model of Pump: Omnipod Insulin Type: Currently using Humalog .  PUMP STATISTICS: Average BG: 180 Average Daily Carbs (g): 0 Average Total Daily Insulin: 66.3 Average Daily Basal: 52.1 (79%) Average Daily Bolus: 14.1 (21%)      HOME DIABETES REGIMEN:  Ozempic 2 mg weekly  Humalog     CONTINUOUS GLUCOSE MONITORING RECORD INTERPRETATION    Dates of Recording: 12/16-12/29/2023  Sensor description:Dexcom  Results statistics:   CGM use % of time 92.6  Average and SD 180/44  Time in range  54%  % Time Above 180 37  % Time above 250 8  % Time Below target 0  Glycemic patterns summary:: BG's optimal at night and high during the day  Hyperglycemic episodes  Post prandial  Hypoglycemic episodes occurred  n/a  Overnight periods: trends down    DIABETIC COMPLICATIONS: Microvascular complications:  Retinopathy (bleeding in the eye )  Denies: CKD, neuropathy  Last eye exam: Completed 2023   Macrovascular complications:  CAD Denies: PVD, CVA       HISTORY:  Past Medical History:  Past Medical History:  Diagnosis Date   Anxiety    Clotting disorder ([place])    blood clot in eye 2016   Coronary artery disease, non-occlusive    a. 11/2016 NSTEMI/Cath: LM nl, LAD 40p, 29m, D1/2 small, LCX 40ost, OM2/3 nl/small, RCA 35 ost/mid, RPDA/RPL small/nl.   Coronary vasospasm (HCC)    Depression    Diastolic dysfunction    a. 11/2016 Echo: EF 55-60%, gr1 DD, sev  calcified MV annulus, mildly dil LA.   Eye hemorrhage 01/2015   "right; resolved" (07/03/2017)   Headache    "monthly" (07/03/2017)   History of blood transfusion    "when I had an ectopic pregnancy"   History of stomach ulcers    Hyperlipidemia    Hypertension    Microcytic anemia    Moderate mitral stenosis by prior echocardiogram 03/2021   TTE: Mod MS (mean gradient 9 mmHg). -- TEE Moderate-Severe MS (mean gradient 14 mmHg) with fixed Post MV Leaflet & Severe MAC w/ large circumscribed calcified mass on the Post MV Leaflet. (Mass previously described in 2018) - CMRI identifies calcified mass & not Tumor.; R&LHC - LVEDP & PCWP both 23 mmHg indicating minimal resting gradient   OSA on CPAP    setting is unknown   PAF (paroxysmal atrial fibrillation) ([place] 03/2021   Event Monitor Aug-Sept 2022: Predominantly sinus rhythm.  1% A. fib burden.  30 brief episodes of PAT.  2 short bursts of NSVT.   PAT (paroxysmal atrial tachycardia)    Event monitor from August 2022 showed 30 brief bursts longest 14 seconds.   Pneumonia    "several times" (07/03/2017)   PVC's (premature ventricular contractions)    Rheumatoid arthritis ([facility]    Syncope 07/03/2017 X 2   called seizures but no medications   Thalassemia    "my cells are sickle cell shaped but I don't have sickle cell anemia" (07/03/2017)   Type II diabetes mellitus ([place]    Past Surgical History:  Past Surgical History:  Procedure Laterality Date   48-Hour Monitor  03/18/2017   Sinus rhythm with sinus tachycardia (rate 58-134 BPM)multiple PVCs noted with couplets and bigeminy. One triplet. 7 runs of PAT ranging from 100-130 bpm. Longest was 33 beats.   BIOPSY  04/03/2021   Procedure: BIOPSY;  Surgeon: Lynn Flock MD;  [facility];  Service: Gastroenterology;;   BREAST BIOPSY Left    BRONCHIAL WASHINGS  03/28/2021   Procedure: BRONCHIAL WASHINGS;  Surgeon: Lynn Hy DO;  [facility]  Service:  Endoscopy;;   CARDIAC CATHETERIZATION N/A 11/19/2016   Procedure: Left Heart Cath and Coronary Angiography;  Surgeon: Lynn Crome MD;  [facility];  Service: Cardiovascular.    LM nl, LAD 40p, 547m D1/2 small, LCX 40ost, OM2/3 nl/small, RCA 35 ost/mid, RPDA/RPL small/nl.   CARDIAC MRI  03/30/2021   Normal LVEF ~53%. 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 No Scar or Late Gadolinium Enhancement on LV Myocardium.   CARPAL TUNNEL RELEASE Right 11/01/2014  COLONOSCOPY     DILATION AND CURETTAGE OF UTERUS     ECTOPIC PREGNANCY SURGERY  X 2   ENTEROSCOPY N/A 09/05/2022   Procedure: ENTEROSCOPY;  Surgeon: Rush Landmark Telford Nab., MD;  [facility];  Service: Gastroenterology;  Laterality: N/A;   ESOPHAGOGASTRODUODENOSCOPY (EGD) WITH PROPOFOL N/A 04/03/2021   Procedure: ESOPHAGOGASTRODUODENOSCOPY (EGD) WITH PROPOFOL;  Surgeon: Yetta Flock, MD;  [facility];  Service: Gastroenterology;  Laterality: N/A;   ESOPHAGOGASTRODUODENOSCOPY (EGD) WITH PROPOFOL N/A 08/15/2021   Procedure: ESOPHAGOGASTRODUODENOSCOPY (EGD) WITH PROPOFOL;  Surgeon: Ladene Artist, MD;  [facility];  Service: Endoscopy;  Laterality: N/A;   HOT HEMOSTASIS N/A 09/05/2022   Procedure: HOT HEMOSTASIS (ARGON PLASMA COAGULATION/BICAP);  Surgeon: Irving Copas., MD;  Location: [name];  Service: Gastroenterology;  Laterality: N/A;   JOINT REPLACEMENT     KNEE ARTHROSCOPY Left 11/2014   RIGHT/LEFT HEART CATH AND CORONARY ANGIOGRAPHY N/A 08/08/2021   Procedure: RIGHT/LEFT HEART CATH AND CORONARY ANGIOGRAPHY;  Surgeon: Leonie Man, MD;  Location: [place] [facility];  Service: Cardiovascular;Ost LCx 40%. Ost-Prox RCA 35%. Prox-Mid LAD 25%-<25% D2. Mod-Severely Elevated LVEDP. Mild PA HTN -> PA mean 31 mmHg with a PCWP and LVEDP of 23 mmHg   SUBMUCOSAL TATTOO INJECTION  09/05/2022   Procedure: SUBMUCOSAL TATTOO  INJECTION;  Surgeon: Irving Copas., MD;  [facility];  Service: Gastroenterology;;   TEE WITHOUT CARDIOVERSION N/A 03/28/2021   Procedure: TRANSESOPHAGEAL ECHOCARDIOGRAM (TEE);  Surgeon: Thayer Headings, MD;  [facility];; LVEF 55-60%. Normal LV Fxn & no RWMA. No LAA thrombus. Gr II Ao Plaque. Large circumscribed calcific mass (1.8 x 1.4 cm) anterior to posterior annulus.  Fixed posterior leaflet with Mod-Severe MS (Mean MVG ~14 mmHg, Peak 20 mmhg).  Mild MR   TONSILLECTOMY     TOTAL KNEE ARTHROPLASTY Right 02/18/2015   Procedure: RIGHT TOTAL KNEE ARTHROPLASTY;  Surgeon: Netta Cedars, MD;  Location: [place];  Service: Orthopedics;  Laterality: Right;   TOTAL KNEE ARTHROPLASTY Left 08/19/2015   Procedure: LEFT TOTAL KNEE ARTHROPLASTY;  Surgeon: Netta Cedars, MD;  Location: [place];  Service: Orthopedics;  Laterality: Left;   TRANSTHORACIC ECHOCARDIOGRAM  11/2016; 07/2017:   a) normal LV size, thickness and function. EF 55-60%. GR 1 DD.No RWMA severe mitral annular calcification, but no mitral stenosis. Mild LA dilation;; b) normal LV size and function.  EF 65-75%.  Unable to assess diastolic function.  Aortic sclerosis with no stenosis.  Moderate mitral stenosis? (Gradient 9 mmHg) -?  Mild-mod left atrial enlargement    TRANSTHORACIC ECHOCARDIOGRAM  01/2019   EF 65 to 70%.  Normal function.  Elevated LVEDP-GR 1 DD.  Normal RV size and function.  Large calcific mass on posterior mitral leaflet mild to moderate mitral stenosis.   TRANSTHORACIC ECHOCARDIOGRAM  03/24/2021   EF 60 to 65%.  LV function with normal wall motion.  Moderate basal septal LVH.  GRII DD & Mild LA dilation.-> elevated LVEDP.  Normal RV function.  Mildly elevated PAP.  Ill-defined density measuring 3.23 x 2.1 cm region of the posterior MV leaflet along with dense MAC.  Moderate MS (mean MVG of 9 mmHg.) Normal AoV & RAP. --> Recommend TEE 2/2 concern for MV SBE.   TRANSTHORACIC ECHOCARDIOGRAM  04/26/2022    EF 60 to 65%.  No RWMA.  Moderate LVH with GR 1 DD.  Normal RV size and function.  Mild LA dilation.  Large calcific mass of posterior MV leaflet.  Mean PG 13 mm  3.  Trivial MR.  Mild to moderate MS.  Severe MAC.  Normal AOV.  Normal RAP.   VAGINAL HYSTERECTOMY     VIDEO BRONCHOSCOPY N/A 03/28/2021   Procedure: VIDEO BRONCHOSCOPY WITHOUT FLUORO;  Surgeon: Julian Hy, DO;  Location: [facility] ENDOSCOPY;  Service: Endoscopy;  Laterality: N/A;   Social History:  reports that she quit smoking about 7 years ago. Her smoking use included cigarettes. She has a 11.00 pack-year smoking history. She has never used smokeless tobacco. She reports that she does not currently use alcohol. She reports that she does not use drugs. Family History:  Family History  Problem Relation Age of Onset   Cancer Mother    Diabetes Mother    Hypertension Mother    Hyperlipidemia Mother    Cancer Father    Hyperlipidemia Father    Mental illness Sister    Diabetes Sister    Diabetes Maternal Grandmother    Diabetes Maternal Grandfather    Colon cancer Neg Hx    Esophageal cancer Neg Hx    Stomach cancer Neg Hx    Rectal cancer Neg Hx    Tremor Neg Hx    Parkinson's disease Neg Hx      HOME MEDICATIONS: Allergies as of 10/12/2022       Reactions   Methotrexate Other (See Comments)   Pneumonitis   Penicillins Hives   Childhood allergy Has [name] had a PCN reaction causing immediate rash, facial/tongue/throat swelling, SOB or lightheadedness with hypotension: Yes Has [name] had a PCN reaction causing severe rash involving mucus membranes or skin necrosis: No Has [name] had a PCN reaction that required hospitalization No Has [name] had a PCN reaction occurring within the last 10 years: No If all of the above answers are "NO", then may proceed with Cephalosporin use.   Farxiga [dapagliflozin] Other (See Comments)   Dizzy and lethargic   Metformin And Related Other (See Comments)   Diarrhea, bleeding    Milk [milk (cow)] Diarrhea   Milk and dairy products        Medication List        Accurate as of October 12, 2022  3:02 PM. If you have any questions, ask your nurse or doctor.          acetaminophen 500 MG tablet Commonly known as: TYLENOL Take 500 mg by mouth every 6 (six) hours as needed for fever or moderate pain.   albuterol 108 (90 Base) MCG/ACT inhaler Commonly known as: VENTOLIN HFA Inhale 2 puffs into the lungs every 6 (six) hours as needed for wheezing or shortness of breath. What changed: when to take Lynn   apixaban 5 MG Tabs tablet Commonly known as: ELIQUIS Take 1 tablet (5 mg total) by mouth 2 (two) times daily. Resume taking on 09/08/22   atorvastatin 80 MG tablet Commonly known as: LIPITOR Take 1 tablet (80 mg total) by mouth every evening.   B-D UF III MINI PEN NEEDLES 31G X 5 MM Misc Generic drug: Insulin Pen Needle USE DAILY WITH VICTOZA AND LANTUS. What changed: additional instructions   Insulin Pen Needle 31G X 5 MM Misc 1 Device by Does not apply route 3 (three) times daily. What changed: Another medication with the same name was changed. Make sure you understand how and when to take each.   cetirizine 10 MG tablet Commonly known as: ZYRTEC Take 10 mg by mouth daily as needed for allergies.   Contour Next Test test strip Generic drug: glucose blood  3x daily   Dexcom G6 Sensor Misc 1 Device by Does not apply route as directed.   Dexcom G6 Transmitter Misc 1 Device by Does not apply route as directed.   diltiazem 360 MG 24 hr capsule Commonly known as: CARDIZEM CD Take 1 capsule (360 mg total) by mouth daily.   folic acid 1 MG tablet Commonly known as: FOLVITE Take 2 tablets (2 mg total) by mouth daily. What changed:  how much to take when to take Lynn   furosemide 20 MG tablet Commonly known as: Lasix Decrease Lasix ( furosemide)  to 20 mg ( 1/2/tablet)  , with 20 mg  you may take a an additional 20 mg if need for swelling  or weight gain more than 3 lbs overnight or 5 lbs in one week -- if you have continue to take an additional 20 mg , go back to 40 mg  Take additional dose as needed for egt gain> 3 lb in 1 day What changed:  how much to take how to take Lynn when to take Lynn reasons to take Lynn additional instructions   hydroxychloroquine 200 MG tablet Commonly known as: PLAQUENIL Take 200 mg by mouth 2 (two) times daily.   insulin lispro 100 UNIT/ML injection Commonly known as: HUMALOG Max Daily 100 units per pump What changed: additional instructions   isosorbide mononitrate 30 MG 24 hr tablet Commonly known as: IMDUR Take 1 tablet (30 mg total) by mouth daily.   Lancets 30G Misc 1 Device by Does not apply route 3 (three) times daily.   leflunomide 20 MG tablet Commonly known as: ARAVA Take 20 mg by mouth daily.   losartan 100 MG tablet Commonly known as: COZAAR Take 0.5 tablets (50 mg total) by mouth daily.   Melatonin 5 MG Chew Chew 5 mg by mouth at bedtime.   multivitamin with minerals Tabs tablet Take 1 tablet by mouth daily.   Omnipod 5 G6 Intro (Gen 5) Kit 1 Device by Does not apply route every other day.   Omnipod 5 G6 Pod (Gen 5) Misc 1 Device by Does not apply route every other day.   pantoprazole 40 MG tablet Commonly known as: PROTONIX Take 1 tablet (40 mg total) by mouth 2 (two) times daily before a meal.   SAFETY-LOK TB SYRINGE 27GX.5" 27G X 1/2" 1 ML Misc Generic drug: TUBERCULIN SYR 1CC/27GX1/2"   sucralfate 1 g tablet Commonly known as: CARAFATE Take 1 tablet (1 g total) by mouth 2 (two) times daily for 14 days.   tirzepatide 5 MG/0.5ML Pen Commonly known as: MOUNJARO Inject 5 mg into the skin once a week.   traZODone 50 MG tablet Commonly known as: DESYREL Take 0.5-1 tablets (25-50 mg total) by mouth at bedtime as needed. for sleep What changed:  reasons to take Lynn additional instructions   VICKS SINEX NA Place 1 spray into the nose daily as  needed (congestion).         OBJECTIVE:   Vital Signs: BP 122/78 (BP Location: Left Arm, [name] Position: Sitting, Cuff Size: Large)   Pulse 79   Ht _0  (1.6 m)   Wt 242 lb (109.8 kg)   LMP  (LMP Unknown)   SpO2 99%   BMI 42.87 kg/m     Wt Readings from Last 3 Encounters:  10/12/22 242 lb (109.8 kg)  09/12/22 236 lb 3.2 oz (107.1 kg)  09/06/22 239 lb 10.2 oz (108.7 kg)     Exam: General: Pt appears  well on O2 through [place]  Lungs: Clear with good BS bilat   Heart: RRR  Extremities: No pretibial edema  Neuro: MS is good with appropriate affect, pt is alert and Ox3       DM foot exam: 04/12/2022 The skin of the feet is intact without sores or ulcerations. The pedal pulses are 2+ on right and 2+ on left. The sensation is intact to a screening 5.07, 10 gram monofilament bilaterally    DATA REVIEWED:  Lab Results  Component Value Date   HGBA1C 7.6 (A) 04/12/2022   HGBA1C 8.6 (H) 11/14/2021   HGBA1C 9.1 (A) 09/11/2021         Latest Reference Range & Units 04/09/22 12:50  Sodium 135 - 145 mmol/L 140  Potassium 3.5 - 5.1 mmol/L 3.3 (L)  Chloride 98 - 111 mmol/L 98  CO2 22 - 32 mmol/L 31  Glucose 70 - 99 mg/dL 195 (H)  BUN 8 - 23 mg/dL 17  Creatinine 0.44 - 1.00 mg/dL 1.14 (H)  Calcium 8.9 - 10.3 mg/dL 10.0  Anion gap 5 - 15  11  Alkaline Phosphatase 38 - 126 U/L 79  Albumin 3.5 - 5.0 g/dL 4.2  AST 15 - 41 U/L 17  ALT 0 - 44 U/L 16  Total Protein 6.5 - 8.1 g/dL 7.5  Total Bilirubin 0.3 - 1.2 mg/dL 0.3    ASSESSMENT / PLAN / RECOMMENDATIONS:   1) Type 2 Diabetes Mellitus, Sub-Optimally , With retinopathy and macrovascular complications - Most recent A1c of 7.9%. Goal A1c <7.0 %.     -We did not obtain an A1c as she recently received a transfusion which will skew her results  - Her average BG based on CGM download is ~ 7.9%  - Switched Ozempic to [place] due to shortage of supplies but also she was having nausea with Ozempic which did not occur with  Mounjaro, will increase Mounjaro  - No changes to the pump settings, basal rate is appropriate but has been noted with postprandial hyperglycemia , which is due to lack of bolus at times and guesstimating on manual bolus other times  - A referral has been placed to podiatry per her request due to callus formation   MEDICATIONS: Increase Mounjaro 7.5 mg weekly  Humalog per pump   Pump   Omnipod Settings   Insulin type   Humalog    Basal rate       0000  2.3 u/h    0930 2.60          I:C ratio       0000 1:9   11:30 1:8              Sensitivity       0000  10      Goal       0000  110             EDUCATION / INSTRUCTIONS: BG monitoring instructions: [name] is instructed to check her blood sugars 3 times a day, fasting , supper and bedtime. Call [place] [facility] if: BG persistently < 70  I reviewed the Rule of 15 for the treatment of hypoglycemia in detail with the [name]. Literature supplied.    F/U in 6 months    Signed electronically by: Mack Guise, MD  [facility] Endocrinology  [address] [address] [place], [place] [address] Phone: [phone] FAX: [phone]   CC: [name], [address]  [address] [place] [address] Phone: [phone]  Fax: [phone]  Return to Endocrinology clinic as below: Future Appointments  Date Time Provider [place]  10/25/2022 11:00 AM Clayton Bibles, NP [facility] None  11/30/2022 11:15 AM [id number]  12/05/2022  1:20 PM Leonie Man, MD [facility] None

## 2022-10-12 NOTE — Patient Instructions (Signed)
Increase Mounjaro 7.5 mg weekly   HOW TO TREAT LOW BLOOD SUGARS (Blood sugar LESS THAN 70 MG/DL) Please follow the RULE OF 15 for the treatment of hypoglycemia treatment (when your (blood sugars are less than 70 mg/dL)   STEP 1: Take 15 grams of carbohydrates when your blood sugar is low, which includes:  3-4 GLUCOSE TABS  OR 3-4 OZ OF JUICE OR REGULAR SODA OR ONE TUBE OF GLUCOSE GEL    STEP 2: RECHECK blood sugar in 15 MINUTES STEP 3: If your blood sugar is still low at the 15 minute recheck --> then, go back to STEP 1 and treat AGAIN with another 15 grams of carbohydrates.

## 2022-10-16 ENCOUNTER — Encounter: Payer: Self-pay | Admitting: Internal Medicine

## 2022-10-19 ENCOUNTER — Encounter: Payer: Self-pay | Admitting: Internal Medicine

## 2022-10-19 ENCOUNTER — Other Ambulatory Visit: Payer: Self-pay

## 2022-10-19 MED ORDER — DEXCOM G6 SENSOR MISC: 1.0000 | 6 refills | Status: DC

## 2022-10-19 MED ORDER — DEXCOM G6 TRANSMITTER MISC: 1.0000 | 3 refills | Status: DC

## 2022-10-25 ENCOUNTER — Ambulatory Visit: Payer: Medicare Other | Admitting: Nurse Practitioner

## 2022-10-25 ENCOUNTER — Encounter: Payer: Self-pay | Admitting: Nurse Practitioner

## 2022-10-25 VITALS — BP 120/70 | HR 87 | Ht 63.0 in | Wt 241.2 lb

## 2022-10-25 DIAGNOSIS — D649 Anemia, unspecified: Secondary | ICD-10-CM

## 2022-10-25 DIAGNOSIS — J849 Interstitial pulmonary disease, unspecified: Secondary | ICD-10-CM

## 2022-10-25 DIAGNOSIS — I5032 Chronic diastolic (congestive) heart failure: Secondary | ICD-10-CM

## 2022-10-25 DIAGNOSIS — J9611 Chronic respiratory failure with hypoxia: Secondary | ICD-10-CM | POA: Diagnosis not present

## 2022-10-25 DIAGNOSIS — G4733 Obstructive sleep apnea (adult) (pediatric): Secondary | ICD-10-CM | POA: Diagnosis not present

## 2022-10-25 NOTE — Assessment & Plan Note (Signed)
See above. She has purchased new oxygen equipment recently and would like to keep everything. Advised her to monitor her sats for goal >88-90% and apply O2 as needed to maintain this. She will continue to wear supplemental oxygen bled through CPAP at night, once she receives new machine.

## 2022-10-25 NOTE — Assessment & Plan Note (Signed)
Euvolemic on exam. Discussed correlation between CHF/mitral valve disease and DOE. Follow up with cardiology as scheduled.

## 2022-10-25 NOTE — Assessment & Plan Note (Signed)
Hospitalized 2022 for pneumonitis secondary to methotrexate. She found out she did not have any down feathered pillows at home. Has since discontinued methotrexate. She was on extended prednisone taper and had significant improvement on imaging and clinically. Unfortunately, has continued to struggle with daily symptoms of DOE and occasional cough. I believe these are multifactorial but given her history of RA, we will need to repeat HRCT to ensure there has not been any disease progression and repeat PFTs (last completed in 2019). Possible she has underlying obstruction as well given her smoking history and response to bronchodilators. Walk test today without desaturation on room air. Advised her that she does not need to wear her oxygen unless she has O2 sats <88-90%. Encouraged her to work on activity, as tolerated.   Patient Instructions  Continue Albuterol inhaler 2 puffs or 3 mL neb every 6 hours as needed for shortness of breath or wheezing. Notify if symptoms persist despite rescue inhaler/neb use. Continue zyrtec 1 tab daily Continue supplemental oxygen 2 lpm as needed for goal >88-90% and 3 lpm bled through CPAP  Orders sent for new CPAP machine Continue to wear nightly once received  Change equipment every 30 days or as directed by DME. Wash your tubing with warm soap and water daily, hang to dry. Wash humidifier portion weekly.  Be aware of reduced alertness and do not drive or operate heavy machinery if experiencing this or drowsiness.   We will order an in lab study if your insurance requires it  High resolution CT chest to follow up on your lungs - someone will contact you for scheduling  Follow up after pulmonary function testing (1 hr PFT) with Dr. Chase Caller or Alanson Aly. If symptoms do not improve or worsen, please contact office for sooner follow up or seek emergency care.

## 2022-10-25 NOTE — Assessment & Plan Note (Signed)
Improved on CBC from 08/2022 to 9.7. No further episodes of melena. Unclear how much this is impacting her current dyspnea as hgb was normal at timing of initial onset. She did have acute worsening when she was hospitalized but reports feeling back to her baseline for the most part now.

## 2022-10-25 NOTE — Patient Instructions (Signed)
Continue Albuterol inhaler 2 puffs or 3 mL neb every 6 hours as needed for shortness of breath or wheezing. Notify if symptoms persist despite rescue inhaler/neb use. Continue zyrtec 1 tab daily Continue supplemental oxygen 2 lpm as needed for goal >88-90% and 3 lpm bled through CPAP  Orders sent for new CPAP machine Continue to wear nightly once received  Change equipment every 30 days or as directed by DME. Wash your tubing with warm soap and water daily, hang to dry. Wash humidifier portion weekly.  Be aware of reduced alertness and do not drive or operate heavy machinery if experiencing this or drowsiness.   We will order an in lab study if your insurance requires it  High resolution CT chest to follow up on your lungs - someone will contact you for scheduling  Follow up after pulmonary function testing (1 hr PFT) with Dr. Chase Caller or Alanson Aly. If symptoms do not improve or worsen, please contact office for sooner follow up or seek emergency care.

## 2022-10-25 NOTE — Progress Notes (Signed)
_0  ID: Lynn [name], female    DOB: Aug 30, 1956, 67 y.o.   MRN: [id number]  Chief Complaint  Patient presents with   Follow-up    F/up    Referring provider: Copland, Gay Filler, MD  HPI: 67 year old female, former smoker (11 pack history) followed for pneumonitis and DOE. She is a patient of Dr. Golden Pop and last seen in office 06/28/2021. Past medical history significant for rheumatoid arthritis previously on methotrexate, HTN, HFpEF, DM II, chronic anemia thalassemia.   She was last seen in 2022 after being admitted in June to the hospital for acute respiratory failure secondary to pneumonitis, felt to be methotrexate or feather pill hypersenstivity after undergoing bronchoscopy with predominant lymphocytosis greater than 60% on BAL. She was treated with high dose steroids and bactrim for PJP prophylaxis. Imaging improved so she was tapered off by September 2022.  TEST/EVENTS:  01/14/2018 PFT: FVC 77, FEV1 84, ratio 87, TLC 75, DLCO 70.  No BD 06/23/2021 HRCT chest: Atherosclerosis.  Severe calcifications of mitral valve.  Much of the previously noted parenchymal lung changes have resolved.  Continues to be some patchy areas of groundglass attenuation, septal thickening and mild subpleural reticulation scattered throughout the lungs bilaterally.  No discernible craniocaudal gradient.  No traction bronchiectasis or frank honeycombing.  Favored to reflect a mild postinfectious or inflammatory fibrosis, significantly improved compared to prior exam.  Some mild air trapping indicative of mild small airways disease.  06/28/2021: OV with Dr. Chase Caller. She was supposed to be done with prednisone taper but for some reason, still on it. Should complete next week. She was discharged on 4 lpm supplemental oxygen. Continues to use 3 lpm at rest and exertion. Feels improved compared to when she was in the hospital. Still has chest tightness and still has some cough. She did not have any  desaturations on her walk test but did become severely symptomatic. Has known moderate to severe MS. Clinically better and CT with significant improvement; perhaps some residual scar tissue. Suggest to no longer use oxygen with rest or only walking short distances. Check ONO on room air. Advised to check with Dr. Ellyn Hack to see if ok to do SOB.  10/25/2022: Today - follow up Patient presents today for overdue follow up. She was encouraged by her PCP to come back to see [place]. Since she was here last, she was hospitalized in November 2023 for anemia related to GI bleed. Feeling better since discharge but hgb has not entirely normalized so she still has some residual fatigue. As far as her breathing goes, it's better since her hospitalization but she feels it is unchanged compared to when she was here last. She gets winded climbing stairs or with uphill walking. She is able to complete ADLs without difficulties. She has an occasional cough, which is sometimes productive with clear to white sputum. She uses her nebulizer treatments daily, which seem to help her clear sputum and decrease chest congestion. She also notices that her cough gets worse if she has swelling so she makes sure to take her lasix when this happens, which helps. Still uses her oxygen with activity and at night. She monitors her oxygen levels at home and is never below 92% on room air. Denies any night sweats, fevers, chills, wheezing, leg swelling, orthopnea, PND. She has RA on plaquenil. She is also on Eliquis for PAF; no further episodes of melena since her hospital stay.   She does have a CPAP, which she wore nightly but it  recently broke. She was diagnosed with sleep apnea around 10-12 years ago. Has been on CPAP since. Receives good benefit from use. She's not sure what her settings were set on. She has noticed that since she hasn't been able to use it, she's been waking up more tired and doesn't feel as rested. Denies drowsy driving or  morning headaches.   Simple office walk 185 feet x  3 laps goal with forehead probe 06/28/2021    10/25/2022  O2 used ra  ra  Number laps completed Didonly 2 of 3  3/3  Comments about pace slow  slow  Resting Pulse Ox/HR 100% and 77/min  96%, 78/min  Final Pulse Ox/HR 98% and 99/min  96%, 115/min  Desaturated </= 88% no  no  Desaturated <= 3% points no  no  Got Tachycardic >/= 90/min yes  yes  Symptoms at end of test Modreate tos evere shortness of breath  mild SOB  Miscellaneous comments        Allergies  Allergen Reactions   Methotrexate Other (See Comments)    Pneumonitis   Penicillins Hives    Childhood allergy Has patient had a PCN reaction causing immediate rash, facial/tongue/throat swelling, SOB or lightheadedness with hypotension: Yes Has patient had a PCN reaction causing severe rash involving mucus membranes or skin necrosis: No Has patient had a PCN reaction that required hospitalization No Has patient had a PCN reaction occurring within the last 10 years: No If all of the above answers are "NO", then may proceed with Cephalosporin use.   [name] [Dapagliflozin] Other (See Comments)    Dizzy and lethargic    Metformin And Related Other (See Comments)    Diarrhea, bleeding   Milk [Milk (Cow)] Diarrhea    Milk and dairy products    Immunization History  Administered Date(s) Administered   Fluad Quad(high Dose 65+) 07/19/2021   Influenza Split 07/22/2012   Influenza, High Dose Seasonal PF 06/10/2019   Influenza,inj,Quad PF,6+ Mos 10/09/2014, 07/13/2015, 11/17/2016, 07/04/2017, 07/10/2018   Influenza-Unspecified 06/20/2020, 07/14/2022   Moderna Sars-Covid-2 Vaccination 11/27/2019, 12/26/2019, 06/22/2020, 12/30/2020, 06/21/2021   Pneumococcal Conjugate-13 03/23/2019   Pneumococcal Polysaccharide-23 08/22/2020   Pneumococcal-Unspecified 02/12/2010   Tdap 02/12/2010, 10/17/2016   Zoster Recombinat (Shingrix) 02/20/2021    Past Medical History:  Diagnosis Date    Anxiety    Clotting disorder ([place])    blood clot in eye 2016   Coronary artery disease, non-occlusive    a. 11/2016 NSTEMI/Cath: LM nl, LAD 40p, 14m, D1/2 small, LCX 40ost, OM2/3 nl/small, RCA 35 ost/mid, RPDA/RPL small/nl.   Coronary vasospasm (H[facility])    Depression    Diastolic dysfunction    a. 11/2016 Echo: EF 55-60%, gr1 DD, sev calcified MV annulus, mildly dil LA.   Eye hemorrhage 01/2015   "right; resolved" (07/03/2017)   Headache    "monthly" (07/03/2017)   History of blood transfusion    "when I had an ectopic pregnancy"   History of stomach ulcers    Hyperlipidemia    Hypertension    Microcytic anemia    Moderate mitral stenosis by prior echocardiogram 03/2021   TTE: Mod MS (mean gradient 9 mmHg). -- TEE Moderate-Severe MS (mean gradient 14 mmHg) with fixed Post MV Leaflet & Severe MAC w/ large circumscribed calcified mass on the Post MV Leaflet. (Mass previously described in 2018) - CMRI identifies calcified mass & not Tumor.; R&LHC - LVEDP & PCWP both 23 mmHg indicating minimal resting gradient   OSA on CPAP  setting is unknown   PAF (paroxysmal atrial fibrillation) ([place]) 03/2021   Event Monitor Aug-Sept 2022: Predominantly sinus rhythm.  1% A. fib burden.  30 brief episodes of PAT.  2 short bursts of NSVT.   PAT (paroxysmal atrial tachycardia)    Event monitor from August 2022 showed 30 brief bursts longest 14 seconds.   Pneumonia    "several times" (07/03/2017)   PVC's (premature ventricular contractions)    Rheumatoid arthritis ([place])    Syncope 07/03/2017 X 2   called seizures but no medications   Thalassemia    "my cells are sickle cell shaped but I don't have sickle cell anemia" (07/03/2017)   Type II diabetes mellitus ([place])     Tobacco History: Social History   Tobacco Use  Smoking Status Former   Packs/day: 0.25   Years: 44.00   Total pack years: 11.00   Types: Cigarettes   Quit date: 02/17/2015   Years since quitting: 7.6  Smokeless Tobacco Never    Counseling given: Not Answered   Outpatient Medications Prior to Visit  Medication Sig Dispense Refill   acetaminophen (TYLENOL) 500 MG tablet Take 500 mg by mouth every 6 (six) hours as needed for fever or moderate pain.     albuterol (VENTOLIN HFA) 108 (90 Base) MCG/ACT inhaler Inhale 2 puffs into the lungs every 6 (six) hours as needed for wheezing or shortness of breath. (Patient taking differently: Inhale 2 puffs into the lungs every other day.) 8 g 2   apixaban (ELIQUIS) 5 MG TABS tablet Take 1 tablet (5 mg total) by mouth 2 (two) times daily. Resume taking on 09/08/22 180 tablet 3   atorvastatin (LIPITOR) 80 MG tablet Take 1 tablet (80 mg total) by mouth every evening. 90 tablet 3   cetirizine (ZYRTEC) 10 MG tablet Take 10 mg by mouth daily as needed for allergies.     Continuous Blood Gluc Sensor (DEXCOM G6 SENSOR) MISC 1 Device by Does not apply route as directed. 3 each 6   Continuous Blood Gluc Transmit (DEXCOM G6 TRANSMITTER) MISC 1 Device by Does not apply route as directed. 1 each 3   diltiazem (CARDIZEM CD) 360 MG 24 hr capsule Take 1 capsule (360 mg total) by mouth daily. 90 capsule 3   folic acid (FOLVITE) 1 MG tablet Take 2 tablets (2 mg total) by mouth daily. (Patient taking differently: Take 1 mg by mouth in the morning and at bedtime.) 180 tablet 3   furosemide (LASIX) 20 MG tablet Decrease Lasix ( furosemide)  to 20 mg ( 1/2/tablet)  , with 20 mg  you may take a an additional 20 mg if need for swelling or weight gain more than 3 lbs overnight or 5 lbs in one week -- if you have continue to take an additional 20 mg , go back to 40 mg  Take additional dose as needed for egt gain> 3 lb in 1 day (Patient taking differently: Take 20 mg by mouth daily as needed for fluid.)     glucose blood (CONTOUR NEXT TEST) test strip 3x daily 300 each 11   hydroxychloroquine (PLAQUENIL) 200 MG tablet Take 200 mg by mouth 2 (two) times daily.     Insulin Disposable Pump (OMNIPOD 5 G6 INTRO,  GEN 5,) KIT 1 Device by Does not apply route every other day. 1 kit 0   Insulin Disposable Pump (OMNIPOD 5 G6 POD, GEN 5,) MISC 1 Device by Does not apply route every other day. 30 each 3  insulin lispro (HUMALOG) 100 UNIT/ML injection Max Daily 100 units per pump (Patient taking differently: Max Daily 100 units per pump per patient) 90 mL 3   Insulin Pen Needle (B-D UF III MINI PEN NEEDLES) 31G X 5 MM MISC USE DAILY WITH VICTOZA AND LANTUS. (Patient taking differently: Use for ozempic) 400 each 1   Insulin Pen Needle 31G X 5 MM MISC 1 Device by Does not apply route 3 (three) times daily. 150 each 11   isosorbide mononitrate (IMDUR) 30 MG 24 hr tablet Take 1 tablet (30 mg total) by mouth daily. 90 tablet 3   Lancets 30G MISC 1 Device by Does not apply route 3 (three) times daily. 300 each 9   leflunomide (ARAVA) 20 MG tablet Take 20 mg by mouth daily.     losartan (COZAAR) 100 MG tablet Take 0.5 tablets (50 mg total) by mouth daily. 90 tablet 3   Melatonin 5 MG CHEW Chew 5 mg by mouth at bedtime.     Multiple Vitamin (MULTIVITAMIN WITH MINERALS) TABS tablet Take 1 tablet by mouth daily.     Oxymetazoline HCl (VICKS SINEX NA) Place 1 spray into the nose daily as needed (congestion).     SAFETY-LOK TB SYRINGE 27GX.5" 27G X 1/2" 1 ML MISC      tirzepatide (MOUNJARO) 7.5 MG/0.5ML Pen Inject 7.5 mg into the skin once a week. 6 mL 3   traZODone (DESYREL) 50 MG tablet Take 0.5-1 tablets (25-50 mg total) by mouth at bedtime as needed. for sleep (Patient taking differently: Take 25-50 mg by mouth at bedtime as needed for sleep.) 90 tablet 2   pantoprazole (PROTONIX) 40 MG tablet Take 1 tablet (40 mg total) by mouth 2 (two) times daily before a meal. 60 tablet 0   sucralfate (CARAFATE) 1 g tablet Take 1 tablet (1 g total) by mouth 2 (two) times daily for 14 days. 28 tablet 0   No facility-administered medications prior to visit.     Review of Systems:   Constitutional: No weight loss or gain, night  sweats, fevers, chills, or lassitude. +fatigue HEENT: No headaches, difficulty swallowing, tooth/dental problems, or sore throat. No sneezing, itching, ear ache, nasal congestion, or post nasal drip CV:  No chest pain, orthopnea, PND, swelling in lower extremities, anasarca, dizziness, palpitations, syncope Resp: +shortness of breath with exertion; occasional cough; snoring without CPAP. No excess mucus or change in color of mucus. No hemoptysis. No wheezing.  No chest wall deformity GI:  No heartburn, indigestion, abdominal pain, nausea, vomiting, diarrhea, change in bowel habits, loss of appetite, bloody stools.  GU: No dysuria, change in color of urine, urgency or frequency.  Skin: No rash, lesions, ulcerations MSK:  No increased joint pain or swelling.   Neuro: No dizziness or lightheadedness.  Psych: No depression or anxiety. Mood stable.     Physical Exam:  BP 120/70 (BP Location: Right Arm)   Pulse 87   Ht _0  (1.6 m)   Wt 241 lb 3.2 oz (109.4 kg)   LMP  (LMP Unknown)   SpO2 97% Comment: On 3L of oxygen (Pulse)  BMI 42.73 kg/m   GEN: Pleasant, interactive, well-kempt; obese; in no acute distress. HEENT:  Normocephalic and atraumatic. PERRLA. Sclera white. Nasal turbinates pink, moist and patent bilaterally. No rhinorrhea present. Oropharynx pink and moist, without exudate or edema. No lesions, ulcerations, or postnasal drip. Mallampati III NECK:  Supple w/ fair ROM. No JVD present. Normal carotid impulses w/o bruits. Thyroid symmetrical with  no goiter or nodules palpated. No lymphadenopathy.   CV: RRR, no m/r/g, no peripheral edema. Pulses intact, +2 bilaterally. No cyanosis, pallor or clubbing. PULMONARY:  Unlabored, regular breathing. Clear bilaterally A&P w/o wheezes/rales/rhonchi. No accessory muscle use.  GI: BS present and normoactive. Soft, non-tender to palpation. No organomegaly or masses detected.  MSK: No erythema, warmth or tenderness. Cap refil <2 sec all extrem.  No deformities or joint swelling noted.  Neuro: A/Ox3. No focal deficits noted.   Skin: Warm, no lesions or rashe Psych: Normal affect and behavior. Judgement and thought content appropriate.     Lab Results:  CBC    Component Value Date/Time   WBC 7.4 09/12/2022 1540   RBC 4.71 09/12/2022 1540   HGB 9.7 (L) 09/12/2022 1540   HGB 8.9 (L) 06/07/2022 1401   HGB 9.3 (L) 07/31/2021 1519   HGB 9.0 (L) 02/26/2017 1501   HCT 30.7 (L) 09/12/2022 1540   HCT 30.5 (L) 07/31/2021 1519   HCT 29.1 (L) 02/26/2017 1501   PLT 279.0 09/12/2022 1540   PLT 250 06/07/2022 1401   PLT 346 07/31/2021 1519   MCV 65.1 aL (L) 09/12/2022 1540   MCV 62 (L) 07/31/2021 1519   MCV 61 (L) 02/26/2017 1501   MCH 19.1 (L) 09/04/2022 1514   MCHC 31.7 09/12/2022 1540   RDW 22.4 (H) 09/12/2022 1540   RDW 15.6 (H) 07/31/2021 1519   RDW 17.1 (H) 02/26/2017 1501   LYMPHSABS 1.4 09/04/2022 1514   LYMPHSABS 2.5 02/26/2017 1501   MONOABS 0.9 09/04/2022 1514   EOSABS 0.4 09/04/2022 1514   EOSABS 0.2 02/26/2017 1501   BASOSABS 0.1 09/04/2022 1514   BASOSABS 0.0 02/26/2017 1501    BMET    Component Value Date/Time   NA 141 09/12/2022 1540   NA 143 09/04/2022 0000   K 3.3 (L) 09/12/2022 1540   CL 101 09/12/2022 1540   CO2 30 09/12/2022 1540   GLUCOSE 120 (H) 09/12/2022 1540   BUN 13 09/12/2022 1540   BUN 12 09/04/2022 0000   CREATININE 1.17 09/12/2022 1540   CREATININE 1.14 (H) 04/09/2022 1250   CREATININE 0.95 01/03/2016 1118   CALCIUM 9.2 09/12/2022 1540   GFRNONAA 55 (L) 09/06/2022 0421   GFRNONAA 52 (L) 04/09/2022 1250   GFRNONAA 65 01/03/2016 1118   GFRAA 56 08/25/2020 0000   GFRAA 75 01/03/2016 1118    BNP    Component Value Date/Time   BNP 103.4 (H) 09/04/2022 2224     Imaging:  No results found.       Latest Ref Rng & Units 01/14/2018   10:59 AM  PFT Results  FVC-Pre L 1.85   FVC-Predicted Pre % 77   FVC-Post L 1.87   FVC-Predicted Post % 78   Pre FEV1/FVC % % 85   Post  FEV1/FCV % % 87   FEV1-Pre L 1.57   FEV1-Predicted Pre % 84   FEV1-Post L 1.62   DLCO uncorrected ml/min/mmHg 15.77   DLCO UNC% % 70   DLVA Predicted % 101   TLC L 3.66   TLC % Predicted % 75   RV % Predicted % 81     No results found for: "NITRICOXIDE"      Assessment & Plan:   ILD (interstitial lung disease) ([place]) Hospitalized 2022 for pneumonitis secondary to methotrexate. She found out she did not have any down feathered pillows at home. Has since discontinued methotrexate. She was on extended prednisone taper and had significant improvement on  imaging and clinically. Unfortunately, has continued to struggle with daily symptoms of DOE and occasional cough. I believe these are multifactorial but given her history of RA, we will need to repeat HRCT to ensure there has not been any disease progression and repeat PFTs (last completed in 2019). Possible she has underlying obstruction as well given her smoking history and response to bronchodilators. Walk test today without desaturation on room air. Advised her that she does not need to wear her oxygen unless she has O2 sats <88-90%. Encouraged her to work on activity, as tolerated.   Patient Instructions  Continue Albuterol inhaler 2 puffs or 3 mL neb every 6 hours as needed for shortness of breath or wheezing. Notify if symptoms persist despite rescue inhaler/neb use. Continue zyrtec 1 tab daily Continue supplemental oxygen 2 lpm as needed for goal >88-90% and 3 lpm bled through CPAP  Orders sent for new CPAP machine Continue to wear nightly once received  Change equipment every 30 days or as directed by DME. Wash your tubing with warm soap and water daily, hang to dry. Wash humidifier portion weekly.  Be aware of reduced alertness and do not drive or operate heavy machinery if experiencing this or drowsiness.   We will order an in lab study if your insurance requires it  High resolution CT chest to follow up on your lungs -  someone will contact you for scheduling  Follow up after pulmonary function testing (1 hr PFT) with Dr. Chase Caller or Alanson Aly. If symptoms do not improve or worsen, please contact office for sooner follow up or seek emergency care.    Chronic respiratory failure with hypoxia (HCC) See above. She has purchased new oxygen equipment recently and would like to keep everything. Advised her to monitor her sats for goal >88-90% and apply O2 as needed to maintain this. She will continue to wear supplemental oxygen bled through CPAP at night, once she receives new machine.   OSA on CPAP Long term history of OSA on CPAP. She reports excellent compliance and good benefit from use. Machine recently broke. No download available. We will send orders for new CPAP machine. May consider titration study to determine exact O2 requirement with CPAP moving forward. Cautioned on safe driving practices.   Chronic diastolic CHF (congestive heart failure) (HCC) Euvolemic on exam. Discussed correlation between CHF/mitral valve disease and DOE. Follow up with cardiology as scheduled.   Anemia Improved on CBC from 08/2022 to 9.7. No further episodes of melena. Unclear how much this is impacting her current dyspnea as hgb was normal at timing of initial onset. She did have acute worsening when she was hospitalized but reports feeling back to her baseline for the most part now.    I spent 45 minutes of dedicated to the care of this patient on the date of this encounter to include pre-visit review of records, face-to-face time with the patient discussing conditions above, post visit ordering of testing, clinical documentation with the electronic health record, making appropriate referrals as documented, and communicating necessary findings to members of the patients care team.  Clayton Bibles, NP 10/25/2022  Pt aware and understands NP's role.

## 2022-10-25 NOTE — Assessment & Plan Note (Signed)
Long term history of OSA on CPAP. She reports excellent compliance and good benefit from use. Machine recently broke. No download available. We will send orders for new CPAP machine. May consider titration study to determine exact O2 requirement with CPAP moving forward. Cautioned on safe driving practices.

## 2022-10-26 DIAGNOSIS — Z794 Long term (current) use of insulin: Secondary | ICD-10-CM | POA: Diagnosis not present

## 2022-10-26 DIAGNOSIS — E1165 Type 2 diabetes mellitus with hyperglycemia: Secondary | ICD-10-CM | POA: Diagnosis not present

## 2022-11-06 ENCOUNTER — Telehealth: Payer: Self-pay | Admitting: Nurse Practitioner

## 2022-11-06 NOTE — Telephone Encounter (Signed)
Unable to find a copy of pt sleep study in need of a new order to process dme order. Message from adapt   S[place]by, Geralynn Rile, Wynona Dove, Melissa; Minus Liberty; Marlowe Sax, [place],  Order has been received, However I am unable to find a copy of the sleep study in the patients chart. I need a copy to process this order.  Thank you,  Demetrius Charity

## 2022-11-12 ENCOUNTER — Encounter: Payer: Self-pay | Admitting: Podiatry

## 2022-11-12 ENCOUNTER — Ambulatory Visit: Payer: Medicare Other | Admitting: Podiatry

## 2022-11-12 DIAGNOSIS — B351 Tinea unguium: Secondary | ICD-10-CM

## 2022-11-12 DIAGNOSIS — M79674 Pain in right toe(s): Secondary | ICD-10-CM

## 2022-11-12 DIAGNOSIS — B353 Tinea pedis: Secondary | ICD-10-CM

## 2022-11-12 DIAGNOSIS — E785 Hyperlipidemia, unspecified: Secondary | ICD-10-CM | POA: Diagnosis not present

## 2022-11-12 DIAGNOSIS — M79675 Pain in left toe(s): Secondary | ICD-10-CM | POA: Diagnosis not present

## 2022-11-12 DIAGNOSIS — E1169 Type 2 diabetes mellitus with other specified complication: Secondary | ICD-10-CM

## 2022-11-12 MED ORDER — KETOCONAZOLE 2 % EX CREA: 1.0000 | TOPICAL_CREAM | Freq: Every day | CUTANEOUS | 2 refills | Status: DC

## 2022-11-12 NOTE — Addendum Note (Signed)
Addended by: Lorenda Peck R on: 11/12/2022 01:57 PM   Modules accepted: Orders

## 2022-11-12 NOTE — Telephone Encounter (Signed)
ATC pt x2 will try again later today

## 2022-11-12 NOTE — Progress Notes (Addendum)
Subjective:  Patient ID: Lynn [name], female    DOB: July 02, 1956,   MRN: [id number]  Chief Complaint  Patient presents with   Diabetes    Diabetic foot care, A1c- 6.3 BG- 143, nail trim and callus     67 y.o. female presents for concern of thickened elongated and painful nails that are difficult to trim. Requesting to have them trimmed today. Relates burning and tingling in their feet. Patient is diabetic and last A1c was  Lab Results  Component Value Date   HGBA1C 7.6 (A) 04/12/2022   .   PCP:  Copland, Gay Filler, MD    . Denies any other pedal complaints. Denies n/v/f/c.   Past Medical History:  Diagnosis Date   Anxiety    Clotting disorder ([place])    blood clot in eye 2016   Coronary artery disease, non-occlusive    a. 11/2016 NSTEMI/Cath: LM nl, LAD 40p, 65m, D1/2 small, LCX 40ost, OM2/3 nl/small, RCA 35 ost/mid, RPDA/RPL small/nl.   Coronary vasospasm (HCC)    Depression    Diastolic dysfunction    a. 11/2016 Echo: EF 55-60%, gr1 DD, sev calcified MV annulus, mildly dil LA.   Eye hemorrhage 01/2015   "right; resolved" (07/03/2017)   Headache    "monthly" (07/03/2017)   History of blood transfusion    "when I had an ectopic pregnancy"   History of stomach ulcers    Hyperlipidemia    Hypertension    Microcytic anemia    Moderate mitral stenosis by prior echocardiogram 03/2021   TTE: Mod MS (mean gradient 9 mmHg). -- TEE Moderate-Severe MS (mean gradient 14 mmHg) with fixed Post MV Leaflet & Severe MAC w/ large circumscribed calcified mass on the Post MV Leaflet. (Mass previously described in 2018) - CMRI identifies calcified mass & not Tumor.; R&LHC - LVEDP & PCWP both 23 mmHg indicating minimal resting gradient   OSA on CPAP    setting is unknown   PAF (paroxysmal atrial fibrillation) ([place] 03/20[place]   Event Monitor Aug-Sept 2022: Predominantly sinus rhythm.  1% A. fib burden.  30 brief episodes of PAT.  2 short bursts of NSVT.   PAT (paroxysmal atrial  tachycardia)    Event monitor from August 2022 showed 30 brief bursts longest 14 seconds.   Pneumonia    "several times" (07/03/2017)   PVC's (premature ventricular contractions)    [place]umatoid [place] ([place]    Syncope 07/03/2017 X 2   called seizures but no medications   Thalassemia    "my cells are sickle cell shaped but I don't have sickle cell anemia" (07/03/2017)   Type II diabetes mellitus (HCC)     Objective:  Physical Exam: Vascular: DP/PT pulses 2/4 bilateral. CFT <3 seconds. Absent hair growth on digits. Edema noted to bilateral lower extremities. Xerosis noted bilaterally.  Skin. No lacerations or abrasions bilateral feet. Nails 1-5 bilateral  are thickened discolored and elongated with subungual debris. Scaling and erythema noted to plantar heel on right  Musculoskeletal: MMT 5/5 bilateral lower extremities in DF, PF, Inversion and Eversion. Deceased ROM in DF of ankle joint.  Neurological: Sensation intact to light touch. Protective sensation diminished bilateral.     Assessment:   1. Pain due to onychomycosis of toenails of both feet   2. Type 2 diabetes mellitus with hyperlipidemia ([place]   3. Tinea pedis of right foot      Plan:  Patient was evaluated and treated and all questions answered. -Discussed and educated patient on diabetic foot  care, especially with  regards to the vascular, neurological and musculoskeletal systems.  -Stressed the importance of good glycemic control and the detriment of not  controlling glucose levels in relation to the foot. -Discussed supportive shoes at all times and checking feet regularly.  -Mechanically debrided all nails 1-5 bilateral using sterile nail nipper and filed with dremel without incident  -Ketoconazole sent to pharmacy  -Answered all patient questions -Patient to return  in 3 months for at risk foot care -Patient advised to call the office if any problems or questions arise in the meantime.   Lorenda Peck, DPM

## 2022-11-12 NOTE — Telephone Encounter (Signed)
[facility], can  you find out where her previous sleep study was completed? I don't have these records. Thanks.

## 2022-11-14 NOTE — Telephone Encounter (Signed)
ATC pt x2 again. Homes number gave a busy sign and mobile number went straight to voicemail. Will try again later.

## 2022-11-23 ENCOUNTER — Ambulatory Visit
Admission: RE | Admit: 2022-11-23 | Discharge: 2022-11-23 | Disposition: A | Discharge status: Home / Self Care | Payer: Medicare Other | Source: Ambulatory Visit | Attending: Nurse Practitioner | Admitting: Nurse Practitioner

## 2022-11-23 DIAGNOSIS — J84112 Idiopathic pulmonary fibrosis: Secondary | ICD-10-CM | POA: Diagnosis not present

## 2022-11-23 DIAGNOSIS — R918 Other nonspecific abnormal finding of lung field: Secondary | ICD-10-CM | POA: Diagnosis not present

## 2022-11-23 DIAGNOSIS — I700 Atherosclerosis of aorta: Secondary | ICD-10-CM | POA: Diagnosis not present

## 2022-11-23 DIAGNOSIS — J479 Bronchiectasis, uncomplicated: Secondary | ICD-10-CM | POA: Diagnosis not present

## 2022-11-23 DIAGNOSIS — J849 Interstitial pulmonary disease, unspecified: Secondary | ICD-10-CM

## 2022-11-28 ENCOUNTER — Other Ambulatory Visit: Payer: Self-pay | Admitting: Gastroenterology

## 2022-11-30 ENCOUNTER — Ambulatory Visit (HOSPITAL_COMMUNITY): Payer: Medicare Other | Attending: Cardiology

## 2022-11-30 DIAGNOSIS — I480 Paroxysmal atrial fibrillation: Secondary | ICD-10-CM

## 2022-11-30 DIAGNOSIS — I050 Rheumatic mitral stenosis: Secondary | ICD-10-CM | POA: Diagnosis not present

## 2022-11-30 LAB — ECHOCARDIOGRAM COMPLETE
Area-P 1/2: 2.65 cm2
MV VTI: 1.35 cm2
S' Lateral: 2.2 cm

## 2022-11-30 MED ORDER — PERFLUTREN LIPID MICROSPHERE
1.0000 mL | INTRAVENOUS | Status: AC | PRN
  Administered 2022-11-30: 2 mL via INTRAVENOUS

## 2022-12-05 ENCOUNTER — Ambulatory Visit: Payer: Medicare Other | Admitting: Cardiology

## 2022-12-14 ENCOUNTER — Ambulatory Visit (INDEPENDENT_AMBULATORY_CARE_PROVIDER_SITE_OTHER): Payer: Medicare Other | Admitting: Internal Medicine

## 2022-12-14 ENCOUNTER — Encounter: Payer: Self-pay | Admitting: Nurse Practitioner

## 2022-12-14 ENCOUNTER — Ambulatory Visit: Payer: Medicare Other | Admitting: Nurse Practitioner

## 2022-12-14 ENCOUNTER — Telehealth: Payer: Self-pay | Admitting: Nurse Practitioner

## 2022-12-14 VITALS — BP 98/64 | HR 89 | Ht 62.5 in | Wt 242.2 lb

## 2022-12-14 DIAGNOSIS — J849 Interstitial pulmonary disease, unspecified: Secondary | ICD-10-CM

## 2022-12-14 DIAGNOSIS — I5032 Chronic diastolic (congestive) heart failure: Secondary | ICD-10-CM | POA: Diagnosis not present

## 2022-12-14 DIAGNOSIS — R059 Cough, unspecified: Secondary | ICD-10-CM

## 2022-12-14 DIAGNOSIS — G4733 Obstructive sleep apnea (adult) (pediatric): Secondary | ICD-10-CM

## 2022-12-14 DIAGNOSIS — J679 Hypersensitivity pneumonitis due to unspecified organic dust: Secondary | ICD-10-CM

## 2022-12-14 DIAGNOSIS — J9611 Chronic respiratory failure with hypoxia: Secondary | ICD-10-CM | POA: Diagnosis not present

## 2022-12-14 DIAGNOSIS — R0602 Shortness of breath: Secondary | ICD-10-CM

## 2022-12-14 DIAGNOSIS — R0609 Other forms of dyspnea: Secondary | ICD-10-CM

## 2022-12-14 DIAGNOSIS — D649 Anemia, unspecified: Secondary | ICD-10-CM | POA: Diagnosis not present

## 2022-12-14 LAB — PULMONARY FUNCTION TEST
DL/VA % pred: 102 %
DL/VA: 4.32 ml/min/mmHg/L
DLCO cor % pred: 67 %
DLCO cor: 12.49 ml/min/mmHg
DLCO unc % pred: 67 %
DLCO unc: 12.49 ml/min/mmHg
FEF 25-75 Pre: 1.16 L/sec
FEF2575-%Pred-Pre: 60 %
FEV1-%Pred-Pre: 46 %
FEV1-Pre: 1.02 L
FEV1FVC-%Pred-Pre: 110 %
FEV6-%Pred-Pre: 43 %
FEV6-Pre: 1.21 L
FEV6FVC-%Pred-Pre: 104 %
FVC-%Pred-Pre: 42 %
FVC-Pre: 1.21 L
Pre FEV1/FVC ratio: 84 %
Pre FEV6/FVC Ratio: 100 %
RV % pred: -307 %
RV: -6.37 L
TLC % pred: 58 %
TLC: 2.81 L

## 2022-12-14 LAB — CBC WITH DIFFERENTIAL/PLATELET
Basophils Absolute: 0.1 10*3/uL (ref 0.0–0.1)
Basophils Relative: 0.6 % (ref 0.0–3.0)
Eosinophils Absolute: 0.2 10*3/uL (ref 0.0–0.7)
Eosinophils Relative: 2 % (ref 0.0–5.0)
HCT: 20.3 % — CL (ref 36.0–46.0)
Hemoglobin: 6.2 g/dL — CL (ref 12.0–15.0)
Lymphocytes Relative: 15.9 % (ref 12.0–46.0)
Lymphs Abs: 1.4 10*3/uL (ref 0.7–4.0)
MCHC: 30.7 g/dL (ref 30.0–36.0)
MCV: 63.8 fl — ABNORMAL LOW (ref 78.0–100.0)
Monocytes Absolute: 1 10*3/uL (ref 0.1–1.0)
Monocytes Relative: 11 % (ref 3.0–12.0)
Neutro Abs: 6.4 10*3/uL (ref 1.4–7.7)
Neutrophils Relative %: 70.5 % (ref 43.0–77.0)
Platelets: 283 10*3/uL (ref 150.0–400.0)
RBC: 3.17 Mil/uL — ABNORMAL LOW (ref 3.87–5.11)
RDW: 21.1 % — ABNORMAL HIGH (ref 11.5–15.5)
WBC: 9.1 10*3/uL (ref 4.0–10.5)

## 2022-12-14 LAB — BASIC METABOLIC PANEL
BUN: 15 mg/dL (ref 6–23)
CO2: 30 mEq/L (ref 19–32)
Calcium: 9.5 mg/dL (ref 8.4–10.5)
Chloride: 103 mEq/L (ref 96–112)
Creatinine, Ser: 1.15 mg/dL (ref 0.40–1.20)
GFR: 48.81 mL/min — ABNORMAL LOW (ref >60.00)
Glucose, Bld: 164 mg/dL — ABNORMAL HIGH (ref 70–99)
Potassium: 4 mEq/L (ref 3.5–5.1)
Sodium: 142 mEq/L (ref 135–145)

## 2022-12-14 LAB — BRAIN NATRIURETIC PEPTIDE: Pro B Natriuretic peptide (BNP): 164 pg/mL — ABNORMAL HIGH (ref 0.0–100.0)

## 2022-12-14 LAB — POCT EXHALED NITRIC OXIDE: FeNO level (ppb): 18

## 2022-12-14 MED ORDER — PREDNISONE 10 MG PO TABS: ORAL_TABLET | ORAL | 0 refills | Status: DC

## 2022-12-14 NOTE — Progress Notes (Signed)
Full PFT performed today. All tests met ATS standards except for the PLETH.

## 2022-12-14 NOTE — Assessment & Plan Note (Signed)
Euvolemic on exam. See above. Follow up with cardiology as scheduled.

## 2022-12-14 NOTE — Telephone Encounter (Signed)
12/14/2022: Attempted to contact patient and spouse via multiple numbers listed in the chart. Left VM on primary [phone] for pt to contact the office. Hgb level 6.2, hct 20.3. Likely cause of SOB. She has a recent history of GI bleed and anemia from November 2023. Needs to go to the ED for further evaluation/workup.

## 2022-12-14 NOTE — Progress Notes (Signed)
12/14/2022 Addendum: Critical lab value with hgb 6.2. Attempted to contact pt numerous times. Was able to get in touch with her at 5:13 PM. Advised patient to go to the ED for further workup/treatment. Asked if she could wait until tomorrow. Recommended to go today. Verbalized understanding.

## 2022-12-14 NOTE — Assessment & Plan Note (Signed)
Hospitalized 2022 for pneumonitis secondary to methotrexate. She found out she did not have any down feathered pillows at home. Has since discontinued methotrexate. She was on extended prednisone taper and had significant improvement on imaging and clinically. Unfortunately, she continued to struggle with daily symptoms of DOE and occasional cough. Felt to be multifactorial but given history of RA, repeat HRCT and PFTs were ordered to evaluate for progression. Her HRCT was stable compared to 06/2021 without evidence of worsening. She does have a decline in her lung function; however, her last pulmonary function testing (2019) was completed prior to her hospitalization in 2022 so hard to say if there has been worsening. Symptom score is worse from 2022. Plan to repeat spiro/DLCO in 12 weeks. Walk test January 2024 without desaturations on room air.  Question possible flare of chronic HP today with increased dyspnea. Onset prior to recent CT, which was without acute process. We will trial prednisone taper. Encouraged her to continue with mucociliary clearance therapies.   Patient Instructions  Continue Albuterol inhaler 2 puffs or 3 mL neb every 6 hours as needed for shortness of breath or wheezing. Notify if symptoms persist despite rescue inhaler/neb use.  Continue zytrec 1 tab daily Continue pantoprazole 40 mg daily  Continue supplemental oxygen 2 lpm as needed to maintain oxygen >88-90% Continue CPAP at night. We will repeat an in lab sleep study per insurance's request so we can get you a new CPAP machine  Prednisone taper. 4 tabs for 2 days, then 3 tabs for 2 days, 2 tabs for 2 days, then 1 tab for 2 days, then stop. Take in AM with food Mucinex DM Twice daily for cough/chest congestion   Labs today- BNP, CBC with diff  Follow up in 2 weeks to see how prednisone helped with Dr. Chase Caller (1st) or [name] Alease Fait,NP. If symptoms do not improve or worsen, please contact office for sooner follow up or seek  emergency care.

## 2022-12-14 NOTE — Assessment & Plan Note (Addendum)
Increased DOE over the past month. CT without worsening of ILD or acute process. O2 levels stable today and lung exam clear. See above plan. She does have a recent history of anemia due to GI bleed - recheck CBC today to rule out worsening anemia. She also has CHF so we will obtain BNP to rule out acute HF exacerbation contributing to symptoms; although, less likely as she appears euvolemic on exam and nl echo 11/30/2022.

## 2022-12-14 NOTE — Patient Instructions (Signed)
Full PFT performed today. All tests met ATS standards except for the PLETH. Patient had hard time keeping a good seal around the mouthpiece.

## 2022-12-14 NOTE — Patient Instructions (Addendum)
Continue Albuterol inhaler 2 puffs or 3 mL neb every 6 hours as needed for shortness of breath or wheezing. Notify if symptoms persist despite rescue inhaler/neb use.  Continue zytrec 1 tab daily Continue pantoprazole 40 mg daily  Continue supplemental oxygen 2 lpm as needed to maintain oxygen >88-90% Continue CPAP at night. We will repeat an in lab sleep study per insurance's request so we can get you a new CPAP machine  Prednisone taper. 4 tabs for 2 days, then 3 tabs for 2 days, 2 tabs for 2 days, then 1 tab for 2 days, then stop. Take in AM with food Mucinex DM Twice daily for cough/chest congestion   Labs today- BNP, CBC with diff  Follow up in 2 weeks to see how prednisone helped with Dr. Chase Caller (1st) or [name] Amaree Loisel,NP. If symptoms do not improve or worsen, please contact office for sooner follow up or seek emergency care.

## 2022-12-14 NOTE — Progress Notes (Signed)
$'@Patient'u$  ID: Lynn [name], female    DOB: 18-May-1956, 67 y.o.   MRN: [id number]  Chief Complaint  Patient presents with   Follow-up    Pt f/u after PFT she wants to go over the results. Using 3L POC currently.    Referring provider: Copland, Lynn Filler, MD  HPI: 67 year old female, former smoker (11 pack history) followed for pneumonitis and DOE. She is a patient of Dr. Golden [name] and last seen in office 10/25/2022 by Lynn Cruise NP. Past medical history significant for rheumatoid arthritis previously on methotrexate, HTN, HFpEF, DM II, chronic anemia thalassemia.   She was last seen in 2022 after being admitted in June to the hospital for acute respiratory failure secondary to pneumonitis, felt to be methotrexate or feather pill hypersenstivity after undergoing bronchoscopy with predominant lymphocytosis greater than 60% on BAL. She was treated with high dose steroids and bactrim for PJP prophylaxis. Imaging improved so she was tapered off by September 2022.  TEST/EVENTS:  01/14/2018 PFT: FVC 77, FEV1 84, ratio 87, TLC 75, DLCO 70.  No BD 06/23/2021 HRCT chest: Atherosclerosis.  Severe calcifications of mitral valve.  Much of the previously noted parenchymal lung changes have resolved.  Continues to be some patchy areas of groundglass attenuation, septal thickening and mild subpleural reticulation scattered throughout the lungs bilaterally.  No discernible craniocaudal gradient.  No traction bronchiectasis or frank honeycombing.  Favored to reflect a mild postinfectious or inflammatory fibrosis, significantly improved compared to prior exam.  Some mild air trapping indicative of mild small airways disease. 11/23/2022 HRCT chest: Mild areas of subpleural reticulation with minimal associated traction bronchiectasis and no clear craniocaudal gradient.  Associated air trapping suggestive of hypersensitivity pneumonitis.  No evidence of progression when compared with previous CT.  Not UIP.  CAD and  atherosclerosis. 12/14/2022 PFT: FVC 42, FEV1 46, TLC 58, DLCOunc 67  06/28/2021: OV with Dr. Chase [name]. She was supposed to be done with prednisone taper but for some reason, still on it. Should complete next week. She was discharged on 4 lpm supplemental oxygen. Continues to use 3 lpm at rest and exertion. Feels improved compared to when she was in the hospital. Still has chest tightness and still has some cough. She did not have any desaturations on her walk test but did become severely symptomatic. Has known moderate to severe MS. Clinically better and CT with significant improvement; perhaps some residual scar tissue. Suggest to no longer use oxygen with rest or only walking short distances. Check ONO on room air. Advised to check with Dr. Ellyn [name] to see if ok to do SOB.  10/25/2022: OV with Lynn Speiser NP for overdue follow up. She was encouraged by her PCP to come back to see [place]. Since she was here last, she was hospitalized in November 2023 for anemia related to GI bleed. Feeling better since discharge but hgb has not entirely normalized so she still has some residual fatigue. As far as her breathing goes, it's better since her hospitalization but she feels it is unchanged compared to when she was here last. She gets winded climbing stairs or with uphill walking. She is able to complete ADLs without difficulties. She has an occasional cough, which is sometimes productive with clear to white sputum. She uses her nebulizer treatments daily, which seem to help her clear sputum and decrease chest congestion. She also notices that her cough gets worse if she has swelling so she makes sure to take her lasix when this happens, which helps. Still uses  her oxygen with activity and at night. She monitors her oxygen levels at home and is never below 92% on room air. Denies any night sweats, fevers, chills, wheezing, leg swelling, orthopnea, PND. She has RA on plaquenil. She is also on Eliquis for PAF; no further episodes of  melena since her hospital stay.   She does have a CPAP, which she wore nightly but it recently broke. She was diagnosed with sleep apnea around 10-12 years ago. Has been on CPAP since. Receives good benefit from use. She's not sure what her settings were set on. She has noticed that since she hasn't been able to use it, she's been waking up more tired and doesn't feel as rested. Denies drowsy driving or morning headaches.   Simple office walk 185 feet x  3 laps goal with forehead probe 06/28/2021    10/25/2022  O2 used ra  ra  Number laps completed Didonly 2 of 3  3/3  Comments about pace slow  slow  Resting Pulse Ox/HR 100% and 77/min  96%, 78/min  Final Pulse Ox/HR 98% and 99/min  96%, 115/min  Desaturated </= 88% no  no  Desaturated <= 3% points no  no  Got Tachycardic >/= 90/min yes  yes  Symptoms at end of test Modreate tos evere shortness of breath  mild SOB  Miscellaneous comments         12/14/2022: Today - follow up Patient presents today for follow up after HRCT chest and PFTs. Her last set of PFTs were prior to her hospitalization for pneumonitis. She has had a significant decline in her lung function with restrictive defect when compared to 2019. Her DLCO is stable from 70% to 67%; although, this was not corrected for current hgb (last draw 08/2022 with hgb 9.7). Her HRCT was stable with mild changes and no evidence of progression. Findings were suggestive of HP and not UIP pattern. She tells me today that she's noticed for the past month, she's felt a little more short winded with activity and feels like she gets fatigued more easily. She has a cough, which is relatively unchanged from baseline, and productive with clear to white phlegm. Occasionally feels like her chest is congestion, which improves with saline neb. Doesn't notice any wheezing. She denies any fevers, night sweats, hemoptysis, low oxygen levels, leg swelling, orthopnea, palpitations. Albuterol inhaler does help some. She  has not missed any doses of her Eliquis.   She is still using her CPAP machine at night. Humidification doesn't seem to be working properly. She wants a new machine. Does not have her previous sleep study. She doesn't have a SD card in her machine. DME stated that she needs a new sleep study but she has yet to have this scheduled.   SYMPTOM SCALE - ILD 06/28/2021  12/14/2022  Current weight 251#  242  O2 use 3LNC    Shortness of Breath 0 -> 5 scale with 5 being worst (score 6 If unable to do)    At rest 3  3  Simple tasks - showers, clothes change, eating, shaving 4  5  Household (dishes, doing bed, laundry) 4  5  Shopping 4  5  Walking level at own pace 4  4  Walking up Stairs 4  5  Total (30-36) Dyspnea Score 23    How bad is your cough? 3  3  How bad is your fatigue Did not answer  4  How bad is nausea 0  0  How bad is vomiting?   0  0  How bad is diarrhea? 2  0  How bad is anxiety? 4  4  How bad is depression 3  3  Any chronic pain - if so where and how bad Did nto sak     Allergies  Allergen Reactions   Methotrexate Other (See Comments)    Pneumonitis   Penicillins Hives    Childhood allergy Has patient had a PCN reaction causing immediate rash, facial/tongue/throat swelling, SOB or lightheadedness with hypotension: Yes Has patient had a PCN reaction causing severe rash involving mucus membranes or skin necrosis: No Has patient had a PCN reaction that required hospitalization No Has patient had a PCN reaction occurring within the last 10 years: No If all of the above answers are "NO", then may proceed with Cephalosporin use.   [name] [Dapagliflozin] Other (See Comments)    Dizzy and lethargic    Metformin And Related Other (See Comments)    Diarrhea, bleeding   Milk [Milk (Cow)] Diarrhea    Milk and dairy products    Immunization History  Administered Date(s) Administered   Fluad Quad(high Dose 65+) 07/19/2021   Influenza Split 07/22/2012   Influenza, High Dose  Seasonal PF 06/10/2019   Influenza,inj,Quad PF,6+ Mos 10/09/2014, 07/13/2015, 11/17/2016, 07/04/2017, 07/10/2018   Influenza-Unspecified 06/20/2020, 07/14/2022   Moderna Sars-Covid-2 Vaccination 11/27/2019, 12/26/2019, 06/22/2020, 12/30/2020, 06/21/2021   Pneumococcal Conjugate-13 03/23/2019   Pneumococcal Polysaccharide-23 08/22/2020   Pneumococcal-Unspecified 02/12/2010   Tdap 02/12/2010, 10/17/2016   Zoster Recombinat (Shingrix) 02/20/2021    Past Medical History:  Diagnosis Date   Anxiety    Clotting disorder ([place])    blood clot in eye 2016   Coronary artery disease, non-occlusive    a. 11/2016 NSTEMI/Cath: LM nl, LAD 40p, 69m, D1/2 small, LCX 40ost, OM2/3 nl/small, RCA 35 ost/mid, RPDA/RPL small/nl.   Coronary vasospasm ([facility])    Depression    Diastolic dysfunction    a. 11/2016 Echo: EF 55-60%, gr1 DD, sev calcified MV annulus, mildly dil LA.   Eye hemorrhage 01/2015   "right; resolved" (07/03/2017)   Headache    "monthly" (07/03/2017)   History of blood transfusion    "when I had an ectopic pregnancy"   History of stomach ulcers    Hyperlipidemia    Hypertension    Microcytic anemia    Moderate mitral stenosis by prior echocardiogram 03/2021   TTE: Mod MS (mean gradient 9 mmHg). -- TEE Moderate-Severe MS (mean gradient 14 mmHg) with fixed Post MV Leaflet & Severe MAC w/ large circumscribed calcified mass on the Post MV Leaflet. (Mass previously described in 2018) - CMRI identifies calcified mass & not Tumor.; R&LHC - LVEDP & PCWP both 23 mmHg indicating minimal resting gradient   OSA on CPAP    setting is unknown   PAF (paroxysmal atrial fibrillation) ([facility] 03/2021   Event Monitor Aug-Sept 2022: Predominantly sinus rhythm.  1% A. fib burden.  30 brief episodes of PAT.  2 short bursts of NSVT.   PAT (paroxysmal atrial tachycardia)    Event monitor from August 2022 showed 30 brief bursts longest 14 seconds.   Pneumonia    "several times" (07/03/2017)   PVC's (premature  ventricular contractions)    Rheumatoid arthritis ([facility]    Syncope 07/03/2017 X 2   called seizures but no medications   Thalassemia    "my cells are sickle cell shaped but I don't have sickle cell anemia" (07/03/2017)   Type II diabetes mellitus ([facility]  Tobacco History: Social History   Tobacco Use  Smoking Status Former   Packs/day: 0.25   Years: 44.00   Total pack years: 11.00   Types: Cigarettes   Quit date: 02/17/2015   Years since quitting: 7.8  Smokeless Tobacco Never   Counseling given: Not Answered   Outpatient Medications Prior to Visit  Medication Sig Dispense Refill   acetaminophen (TYLENOL) 500 MG tablet Take 500 mg by mouth every 6 (six) hours as needed for fever or moderate pain.     albuterol (VENTOLIN HFA) 108 (90 Base) MCG/ACT inhaler Inhale 2 puffs into the lungs every 6 (six) hours as needed for wheezing or shortness of breath. (Patient taking differently: Inhale 2 puffs into the lungs every other day.) 8 g 2   apixaban (ELIQUIS) 5 MG TABS tablet Take 1 tablet (5 mg total) by mouth 2 (two) times daily. Resume taking on 09/08/22 180 tablet 3   atorvastatin (LIPITOR) 80 MG tablet Take 1 tablet (80 mg total) by mouth every evening. 90 tablet 3   cetirizine (ZYRTEC) 10 MG tablet Take 10 mg by mouth daily as needed for allergies.     Continuous Blood Gluc Sensor (DEXCOM G6 SENSOR) MISC 1 Device by Does not apply route as directed. 3 each 6   Continuous Blood Gluc Transmit (DEXCOM G6 TRANSMITTER) MISC 1 Device by Does not apply route as directed. 1 each 3   diltiazem (CARDIZEM CD) 360 MG 24 hr capsule Take 1 capsule (360 mg total) by mouth daily. 90 capsule 3   folic acid (FOLVITE) 1 MG tablet Take 2 tablets (2 mg total) by mouth daily. (Patient taking differently: Take 1 mg by mouth in the morning and at bedtime.) 180 tablet 3   furosemide (LASIX) 20 MG tablet Decrease Lasix ( furosemide)  to 20 mg ( 1/2/tablet)  , with 20 mg  you may take a an additional 20 mg if  need for swelling or weight gain more than 3 lbs overnight or 5 lbs in one week -- if you have continue to take an additional 20 mg , go back to 40 mg  Take additional dose as needed for egt gain> 3 lb in 1 day (Patient taking differently: Take 20 mg by mouth daily as needed for fluid.)     glucose blood (CONTOUR NEXT TEST) test strip 3x daily 300 each 11   hydroxychloroquine (PLAQUENIL) 200 MG tablet Take 200 mg by mouth 2 (two) times daily.     Insulin Disposable Pump (OMNIPOD 5 G6 INTRO, GEN 5,) KIT 1 Device by Does not apply route every other day. 1 kit 0   Insulin Disposable Pump (OMNIPOD 5 G6 POD, GEN 5,) MISC 1 Device by Does not apply route every other day. 30 each 3   insulin lispro (HUMALOG) 100 UNIT/ML injection Max Daily 100 units per pump (Patient taking differently: Max Daily 100 units per pump per patient) 90 mL 3   Insulin Pen Needle (B-D UF III MINI PEN NEEDLES) 31G X 5 MM MISC USE DAILY WITH VICTOZA AND LANTUS. (Patient taking differently: Use for ozempic) 400 each 1   Insulin Pen Needle 31G X 5 MM MISC 1 Device by Does not apply route 3 (three) times daily. 150 each 11   isosorbide mononitrate (IMDUR) 30 MG 24 hr tablet Take 1 tablet (30 mg total) by mouth daily. 90 tablet 3   ketoconazole (NIZORAL) 2 % cream Apply 1 Application topically daily. 60 g 2   Lancets 30G MISC  1 Device by Does not apply route 3 (three) times daily. 300 each 9   leflunomide (ARAVA) 20 MG tablet Take 20 mg by mouth daily.     losartan (COZAAR) 100 MG tablet Take 0.5 tablets (50 mg total) by mouth daily. 90 tablet 3   Melatonin 5 MG CHEW Chew 5 mg by mouth at bedtime.     Multiple Vitamin (MULTIVITAMIN WITH MINERALS) TABS tablet Take 1 tablet by mouth daily.     Oxymetazoline HCl (VICKS SINEX NA) Place 1 spray into the nose daily as needed (congestion).     SAFETY-LOK TB SYRINGE 27GX.5" 27G X 1/2" 1 ML MISC      tirzepatide (MOUNJARO) 7.5 MG/0.5ML Pen Inject 7.5 mg into the skin once a week. 6 mL 3    traZODone (DESYREL) 50 MG tablet Take 0.5-1 tablets (25-50 mg total) by mouth at bedtime as needed. for sleep (Patient taking differently: Take 25-50 mg by mouth at bedtime as needed for sleep.) 90 tablet 2   pantoprazole (PROTONIX) 40 MG tablet Take 1 tablet (40 mg total) by mouth 2 (two) times daily before a meal. 60 tablet 0   sucralfate (CARAFATE) 1 g tablet Take 1 tablet (1 g total) by mouth 2 (two) times daily for 14 days. 28 tablet 0   No facility-administered medications prior to visit.     Review of Systems:   Constitutional: No weight loss or gain, night sweats, fevers, chills, or lassitude. +fatigue HEENT: No headaches, difficulty swallowing, tooth/dental problems, or sore throat. No sneezing, itching, ear ache, nasal congestion, or post nasal drip CV:  No chest pain, orthopnea, PND, swelling in lower extremities, anasarca, dizziness, palpitations, syncope Resp: +shortness of breath with exertion; occasional cough; chest congestion; snoring without CPAP. No excess mucus or change in color of mucus. No hemoptysis. No wheezing.  No chest wall deformity GI:  No heartburn, indigestion, abdominal pain, nausea, vomiting, diarrhea, change in bowel habits, loss of appetite, bloody stools.  GU: No dysuria, change in color of urine, urgency or frequency.  Skin: No rash, lesions, ulcerations MSK:  No increased joint pain or swelling.   Neuro: No dizziness or lightheadedness.  Psych: No depression or anxiety. Mood stable.     Physical Exam:  BP 98/64   Pulse 89   Ht 5' 2.5" (1.588 m)   Wt 242 lb 3.2 oz (109.9 kg)   LMP  (LMP Unknown)   SpO2 95%   BMI 43.59 kg/m   GEN: Pleasant, interactive, well-kempt; obese; in no acute distress. HEENT:  Normocephalic and atraumatic. PERRLA. Sclera white. Nasal turbinates pink, moist and patent bilaterally. No rhinorrhea present. Oropharynx pink and moist, without exudate or edema. No lesions, ulcerations, or postnasal drip. Mallampati III NECK:   Supple w/ fair ROM. No JVD present. Normal carotid impulses w/o bruits. Thyroid symmetrical with no goiter or nodules palpated. No lymphadenopathy.   CV: RRR, no m/r/g, no peripheral edema. Pulses intact, +2 bilaterally. No cyanosis, pallor or clubbing. PULMONARY:  Unlabored, regular breathing. Clear bilaterally A&P w/o wheezes/rales/rhonchi. No accessory muscle use.  GI: BS present and normoactive. Soft, non-tender to palpation. No organomegaly or masses detected.  MSK: No erythema, warmth or tenderness. Cap refil <2 sec all extrem. No deformities or joint swelling noted.  Neuro: A/Ox3. No focal deficits noted.   Skin: Warm, no lesions or rashe Psych: Normal affect and behavior. Judgement and thought content appropriate.     Lab Results:  CBC    Component Value Date/Time  WBC 9.1 12/14/2022 1223   RBC 3.17 (L) 12/14/2022 1223   HGB 6.2 Repeated and verified X2. (LL) 12/14/2022 1223   HGB 8.9 (L) 06/07/2022 1401   HGB 9.3 (L) 07/31/2021 1519   HGB 9.0 (L) 02/26/2017 1501   HCT 20.3 Repeated and verified X2. (LL) 12/14/2022 1223   HCT 30.5 (L) 07/31/2021 1519   HCT 29.1 (L) 02/26/2017 1501   PLT 283.0 12/14/2022 1223   PLT 250 06/07/2022 1401   PLT 346 07/31/2021 1519   MCV 63.8 Repeated and verified X2. (L) 12/14/2022 1223   MCV 62 (L) 07/31/2021 1519   MCV 61 (L) 02/26/2017 1501   MCH 19.1 (L) 09/04/2022 1514   MCHC 30.7 12/14/2022 1223   RDW 21.1 (H) 12/14/2022 1223   RDW 15.6 (H) 07/31/2021 1519   RDW 17.1 (H) 02/26/2017 1501   LYMPHSABS 1.4 12/14/2022 1223   LYMPHSABS 2.5 02/26/2017 1501   MONOABS 1.0 12/14/2022 1223   EOSABS 0.2 12/14/2022 1223   EOSABS 0.2 02/26/2017 1501   BASOSABS 0.1 12/14/2022 1223   BASOSABS 0.0 02/26/2017 1501    BMET    Component Value Date/Time   NA 142 12/14/2022 1223   NA 143 09/04/2022 0000   K 4.0 12/14/2022 1223   CL 103 12/14/2022 1223   CO2 30 12/14/2022 1223   GLUCOSE 164 (H) 12/14/2022 1223   BUN 15 12/14/2022 1223   BUN  12 09/04/2022 0000   CREATININE 1.15 12/14/2022 1223   CREATININE 1.14 (H) 04/09/2022 1250   CREATININE 0.95 01/03/2016 1118   CALCIUM 9.5 12/14/2022 1223   GFRNONAA 55 (L) 09/06/2022 0421   GFRNONAA 52 (L) 04/09/2022 1250   GFRNONAA 65 01/03/2016 1118   GFRAA 56 08/25/2020 0000   GFRAA 75 01/03/2016 1118    BNP    Component Value Date/Time   BNP 103.4 (H) 09/04/2022 2224     Imaging:  ECHOCARDIOGRAM COMPLETE  Result Date: 11/30/2022    ECHOCARDIOGRAM REPORT   Patient Name:   Lynn [name] Date of Exam: 11/30/2022 Medical Rec #:  [id number]               Height:       63.0 in Accession #:    [id number]              Weight:       241.2 lb Date of Birth:  [birth year]                BSA:          2.094 m Patient Age:    9 years                BP:           132/72 mmHg Patient Gender: F                       HR:           90 bpm. Exam Location:  [place] Procedure: 2D Echo, Cardiac Doppler, Color Doppler and Intracardiac            Opacification Agent Indications:    I34.2 Nonrheumatic mitral (valve) stenosis  History:        Patient has prior history of Echocardiogram examinations, most                 recent 04/26/2022. CAD, Arrythmias:Atrial Fibrillation and PVC,  Signs/Symptoms:Syncope; Risk Factors:Hypertension, Diabetes,                 Dyslipidemia, Sleep Apnea and Former Smoker. Paroxysmal atrial                 tachycardia. Morbid obesity. Dyspnea on exertion. Interstitial                 lung disease.  Sonographer:    Diamond Nickel RCS Referring Phys: [place]  1. Left ventricular ejection fraction, by estimation, is 60 to 65%. The left ventricle has normal function. The left ventricle has no regional wall motion abnormalities. There is mild concentric left ventricular hypertrophy. Left ventricular diastolic parameters are consistent with Grade I diastolic dysfunction (impaired relaxation).  2. Right ventricular systolic function is  normal. The right ventricular size is normal.  3. Left atrial size was moderately dilated.  4. Large calcified mass at posterior mitral annulus is likely exuberant mitral annular calcification. The mitral valve is degenerative. No evidence of mitral valve regurgitation. Moderate mitral stenosis. The mean mitral valve gradient is 12.0 mmHg, MVA  1.5 cm^2. Severe mitral annular calcification.  5. The aortic valve is tricuspid. There is moderate calcification of the aortic valve. Aortic valve regurgitation is not visualized. Aortic valve sclerosis/calcification is present, without any evidence of aortic stenosis.  6. The inferior vena cava is normal in size with greater than 50% respiratory variability, suggesting right atrial pressure of 3 mmHg. FINDINGS  Left Ventricle: Left ventricular ejection fraction, by estimation, is 60 to 65%. The left ventricle has normal function. The left ventricle has no regional wall motion abnormalities. The left ventricular internal cavity size was normal in size. There is  mild concentric left ventricular hypertrophy. Left ventricular diastolic parameters are consistent with Grade I diastolic dysfunction (impaired relaxation). Right Ventricle: The right ventricular size is normal. No increase in right ventricular wall thickness. Right ventricular systolic function is normal. Left Atrium: Left atrial size was moderately dilated. Right Atrium: Right atrial size was normal in size. Pericardium: There is no evidence of pericardial effusion. Mitral Valve: Large calcified mass at posterior mitral annulus is likely exuberant mitral annular calcification. The mitral valve is degenerative in appearance. There is moderate calcification of the mitral valve leaflet(s). Severe mitral annular calcification. No evidence of mitral valve regurgitation. Moderate mitral valve stenosis. MV peak gradient, 22.6 mmHg. The mean mitral valve gradient is 12.0 mmHg. Tricuspid Valve: The tricuspid valve is normal  in structure. Tricuspid valve regurgitation is trivial. Aortic Valve: The aortic valve is tricuspid. There is moderate calcification of the aortic valve. Aortic valve regurgitation is not visualized. Aortic valve sclerosis/calcification is present, without any evidence of aortic stenosis. Pulmonic Valve: The pulmonic valve was normal in structure. Pulmonic valve regurgitation is not visualized. Aorta: The aortic root is normal in size and structure. Venous: The inferior vena cava is normal in size with greater than 50% respiratory variability, suggesting right atrial pressure of 3 mmHg. IAS/Shunts: No atrial level shunt detected by color flow Doppler.  LEFT VENTRICLE PLAX 2D LVIDd:         3.80 cm   Diastology LVIDs:         2.20 cm   LV e' medial:    9.29 cm/s LV PW:         1.30 cm   LV E/e' medial:  17.8 LV IVS:        1.10 cm   LV e' lateral:   7.14  cm/s LVOT diam:     2.00 cm   LV E/e' lateral: 23.1 LV SV:         94 LV SV Index:   45 LVOT Area:     3.14 cm  RIGHT VENTRICLE RV S prime:     16.60 cm/s TAPSE (M-mode): 3.0 cm LEFT ATRIUM             Index        RIGHT ATRIUM           Index LA diam:        3.40 cm 1.62 cm/m   RA Area:     11.20 cm LA Vol (A2C):   55.1 ml 26.31 ml/m  RA Volume:   21.30 ml  10.17 ml/m LA Vol (A4C):   95.4 ml 45.56 ml/m LA Biplane Vol: 73.5 ml 35.10 ml/m  AORTIC VALVE LVOT Vmax:   135.67 cm/s LVOT Vmean:  95.933 cm/s LVOT VTI:    0.300 m  AORTA Ao Root diam: 3.10 cm Ao Asc diam:  2.70 cm MITRAL VALVE MV Area (PHT): 2.65 cm     SHUNTS MV Area VTI:   1.35 cm     Systemic VTI:  0.30 m MV Peak grad:  22.6 mmHg    Systemic Diam: 2.00 cm MV Mean grad:  12.0 mmHg MV Vmax:       2.38 m/s MV Vmean:      166.0 cm/s MV Decel Time: 287 msec MV E velocity: 165.00 cm/s MV A velocity: 200.67 cm/s MV E/A ratio:  0.82 Dalton McleanMD Electronically signed by Franki Monte Signature Date/Time: 11/30/2022/2:50:56 PM    Final    CT CHEST HIGH RESOLUTION  Result Date: 11/23/2022 CLINICAL  DATA:  Interstitial lung disease EXAM: CT CHEST WITHOUT CONTRAST TECHNIQUE: Multidetector CT imaging of the chest was performed following the standard protocol without intravenous contrast. High resolution imaging of the lungs, as well as inspiratory and expiratory imaging, was performed. RADIATION DOSE REDUCTION: This exam was performed according to the departmental dose-optimization program which includes automated exposure control, adjustment of the mA and/or kV according to patient size and/or use of iterative reconstruction technique. COMPARISON:  High-resolution chest CT dated March 30, 2021 FINDINGS: Cardiovascular: Mild cardiomegaly. No pericardial effusion. Normal caliber thoracic aorta with moderate atherosclerotic disease. Severe three-vessel coronary artery calcifications. Caseous calcification of the mitral annulus. Mediastinum/Nodes: Esophagus and thyroid are unremarkable. No pathologically enlarged lymph nodes seen in the chest. Lungs/Pleura: Central airways are patent. Bilateral mosaic attenuation with evidence of air trapping on expiratory phase imaging. Mild areas of subpleural reticulation with minimal associated traction bronchiectasis and no clear craniocaudal predominance. No evidence of honeycomb change. No consolidation, pleural effusion or pneumothorax. Upper Abdomen: No acute abnormality. Musculoskeletal: No chest wall mass or suspicious bone lesions identified. IMPRESSION: 1. Mild areas of subpleural reticulation with minimal associated traction bronchiectasis and no clear craniocaudal predominance. No evidence of progression when compared with most recent prior. Associated air trapping suggestive of hypersensitivity pneumonitis. Findings are suggestive of an alternative diagnosis (not UIP) per consensus guidelines: Diagnosis of Idiopathic Pulmonary Fibrosis: An Official ATS/ERS/JRS/ALAT Clinical Practice Guideline. [place], Iss 5, [id number], Jun 15 2017. 2. Severe  three-vessel coronary artery calcifications. 3. Aortic Atherosclerosis (ICD10-I70.0). Electronically Signed   By: Yetta Glassman M.D.   On: 11/23/2022 23:06    perflutren lipid microspheres (DEFINITY) IV suspension     Date Action Dose Route User   11/30/2022 1229 Given 2  mL Intravenous (Left Arm) Valinda Hoar, RCS          Latest Ref Rng & Units 12/14/2022   10:02 AM 01/14/2018   10:59 AM  PFT Results  FVC-Pre L 1.21  P 1.85   FVC-Predicted Pre % 42  P 77   FVC-Post L  1.87   FVC-Predicted Post %  78   Pre FEV1/FVC % % 84  P 85   Post FEV1/FCV % %  87   FEV1-Pre L 1.02  P 1.57   FEV1-Predicted Pre % 46  P 84   FEV1-Post L  1.62   DLCO uncorrected ml/min/mmHg 12.49  P 15.77   DLCO UNC% % 67  P 70   DLCO corrected ml/min/mmHg 12.49  P   DLCO COR %Predicted % 67  P   DLVA Predicted % 102  P 101   TLC L 2.81  P 3.66   TLC % Predicted % 58  P 75   RV % Predicted % -307  P 81     P Preliminary result    No results found for: "NITRICOXIDE"      Assessment & Plan:   ILD (interstitial lung disease) ([place]) Hospitalized 2022 for pneumonitis secondary to methotrexate. She found out she did not have any down feathered pillows at home. Has since discontinued methotrexate. She was on extended prednisone taper and had significant improvement on imaging and clinically. Unfortunately, she continued to struggle with daily symptoms of DOE and occasional cough. Felt to be multifactorial but given history of RA, repeat HRCT and PFTs were ordered to evaluate for progression. Her HRCT was stable compared to 06/2021 without evidence of worsening. She does have a decline in her lung function; however, her last pulmonary function testing (2019) was completed prior to her hospitalization in 2022 so hard to say if there has been worsening. Symptom score is worse from 2022. Plan to repeat spiro/DLCO in 12 weeks. Walk test January 2024 without desaturations on room air.  Question possible flare of  chronic HP today with increased dyspnea. Onset prior to recent CT, which was without acute process. We will trial prednisone taper. Encouraged her to continue with mucociliary clearance therapies.   Patient Instructions  Continue Albuterol inhaler 2 puffs or 3 mL neb every 6 hours as needed for shortness of breath or wheezing. Notify if symptoms persist despite rescue inhaler/neb use.  Continue zytrec 1 tab daily Continue pantoprazole 40 mg daily  Continue supplemental oxygen 2 lpm as needed to maintain oxygen >88-90% Continue CPAP at night. We will repeat an in lab sleep study per insurance's request so we can get you a new CPAP machine  Prednisone taper. 4 tabs for 2 days, then 3 tabs for 2 days, 2 tabs for 2 days, then 1 tab for 2 days, then stop. Take in AM with food Mucinex DM Twice daily for cough/chest congestion   Labs today- BNP, CBC with diff  Follow up in 2 weeks to see how prednisone helped with Dr. Chase [name] (1st) or [name] Gurpreet Mikhail,NP. If symptoms do not improve or worsen, please contact office for sooner follow up or seek emergency care.    DOE (dyspnea on exertion) Increased DOE over the past month. CT without worsening of ILD or acute process. O2 levels stable today and lung exam clear. See above plan. She does have a recent history of anemia due to GI bleed - recheck CBC today to rule out worsening anemia. She also has CHF so we  will obtain BNP to rule out acute HF exacerbation contributing to symptoms; although, less likely as she appears euvolemic on exam and nl echo 11/30/2022.   Chronic respiratory failure with hypoxia (HCC) Stable without increased O2 requirements. Monitors levels at home without desaturations. Walk test January 2024 on room air without desats. Advised her to monitor at home for goal >88-90% and utilize O2 as needed.   Chronic diastolic CHF (congestive heart failure) (HCC) Euvolemic on exam. See above. Follow up with cardiology as scheduled.    I spent 45  minutes of dedicated to the care of this patient on the date of this encounter to include pre-visit review of records, face-to-face time with the patient discussing conditions above, post visit ordering of testing, clinical documentation with the electronic health record, making appropriate referrals as documented, and communicating necessary findings to members of the patients care team.  Clayton Bibles, NP 12/14/2022  Pt aware and understands NP's role.

## 2022-12-14 NOTE — Assessment & Plan Note (Addendum)
Stable without increased O2 requirements. Monitors levels at home without desaturations. Walk test January 2024 on room air without desats. Advised her to monitor at home for goal >88-90% and utilize O2 as needed.

## 2022-12-15 ENCOUNTER — Other Ambulatory Visit: Payer: Self-pay

## 2022-12-15 ENCOUNTER — Encounter (HOSPITAL_COMMUNITY): Payer: Self-pay

## 2022-12-15 ENCOUNTER — Observation Stay (HOSPITAL_COMMUNITY)
Admission: EM | Admit: 2022-12-15 | Discharge: 2022-12-18 | Disposition: A | Discharge status: Home / Self Care | Payer: Medicare Other | Attending: Internal Medicine | Admitting: Internal Medicine

## 2022-12-15 DIAGNOSIS — K5730 Diverticulosis of large intestine without perforation or abscess without bleeding: Secondary | ICD-10-CM | POA: Diagnosis not present

## 2022-12-15 DIAGNOSIS — D509 Iron deficiency anemia, unspecified: Secondary | ICD-10-CM | POA: Diagnosis not present

## 2022-12-15 DIAGNOSIS — I5032 Chronic diastolic (congestive) heart failure: Secondary | ICD-10-CM | POA: Diagnosis present

## 2022-12-15 DIAGNOSIS — Z96653 Presence of artificial knee joint, bilateral: Secondary | ICD-10-CM | POA: Insufficient documentation

## 2022-12-15 DIAGNOSIS — D62 Acute posthemorrhagic anemia: Principal | ICD-10-CM | POA: Diagnosis present

## 2022-12-15 DIAGNOSIS — I480 Paroxysmal atrial fibrillation: Secondary | ICD-10-CM | POA: Diagnosis not present

## 2022-12-15 DIAGNOSIS — K921 Melena: Secondary | ICD-10-CM | POA: Insufficient documentation

## 2022-12-15 DIAGNOSIS — K449 Diaphragmatic hernia without obstruction or gangrene: Secondary | ICD-10-CM | POA: Insufficient documentation

## 2022-12-15 DIAGNOSIS — I2510 Atherosclerotic heart disease of native coronary artery without angina pectoris: Secondary | ICD-10-CM | POA: Diagnosis not present

## 2022-12-15 DIAGNOSIS — E1169 Type 2 diabetes mellitus with other specified complication: Secondary | ICD-10-CM | POA: Diagnosis present

## 2022-12-15 DIAGNOSIS — K259 Gastric ulcer, unspecified as acute or chronic, without hemorrhage or perforation: Secondary | ICD-10-CM | POA: Diagnosis not present

## 2022-12-15 DIAGNOSIS — Z794 Long term (current) use of insulin: Secondary | ICD-10-CM | POA: Diagnosis not present

## 2022-12-15 DIAGNOSIS — N179 Acute kidney failure, unspecified: Secondary | ICD-10-CM | POA: Diagnosis not present

## 2022-12-15 DIAGNOSIS — Z79899 Other long term (current) drug therapy: Secondary | ICD-10-CM | POA: Insufficient documentation

## 2022-12-15 DIAGNOSIS — Z7901 Long term (current) use of anticoagulants: Secondary | ICD-10-CM | POA: Diagnosis not present

## 2022-12-15 DIAGNOSIS — E66813 Obesity, class 3: Secondary | ICD-10-CM | POA: Diagnosis present

## 2022-12-15 DIAGNOSIS — R5383 Other fatigue: Secondary | ICD-10-CM | POA: Diagnosis not present

## 2022-12-15 DIAGNOSIS — D649 Anemia, unspecified: Secondary | ICD-10-CM

## 2022-12-15 DIAGNOSIS — R195 Other fecal abnormalities: Secondary | ICD-10-CM

## 2022-12-15 DIAGNOSIS — E669 Obesity, unspecified: Secondary | ICD-10-CM | POA: Diagnosis not present

## 2022-12-15 DIAGNOSIS — E785 Hyperlipidemia, unspecified: Secondary | ICD-10-CM | POA: Insufficient documentation

## 2022-12-15 DIAGNOSIS — R0602 Shortness of breath: Secondary | ICD-10-CM | POA: Diagnosis not present

## 2022-12-15 DIAGNOSIS — J849 Interstitial pulmonary disease, unspecified: Secondary | ICD-10-CM | POA: Diagnosis present

## 2022-12-15 DIAGNOSIS — I10 Essential (primary) hypertension: Secondary | ICD-10-CM | POA: Diagnosis present

## 2022-12-15 DIAGNOSIS — Z87891 Personal history of nicotine dependence: Secondary | ICD-10-CM | POA: Insufficient documentation

## 2022-12-15 DIAGNOSIS — I110 Hypertensive heart disease with heart failure: Secondary | ICD-10-CM | POA: Diagnosis not present

## 2022-12-15 DIAGNOSIS — G4733 Obstructive sleep apnea (adult) (pediatric): Secondary | ICD-10-CM

## 2022-12-15 LAB — CBC
HCT: 20.1 % — ABNORMAL LOW (ref 36.0–46.0)
Hemoglobin: 5.7 g/dL — CL (ref 12.0–15.0)
MCH: 19 pg — ABNORMAL LOW (ref 26.0–34.0)
MCHC: 28.4 g/dL — ABNORMAL LOW (ref 30.0–36.0)
MCV: 67 fL — ABNORMAL LOW (ref 80.0–100.0)
Platelets: 285 10*3/uL (ref 150–400)
RBC: 3 MIL/uL — ABNORMAL LOW (ref 3.87–5.11)
RDW: 20.8 % — ABNORMAL HIGH (ref 11.5–15.5)
WBC: 8.5 10*3/uL (ref 4.0–10.5)
nRBC: 0.4 % — ABNORMAL HIGH (ref 0.0–0.2)

## 2022-12-15 LAB — PROTIME-INR
INR: 1.2 (ref 0.8–1.2)
Prothrombin Time: 15.2 seconds (ref 11.4–15.2)

## 2022-12-15 LAB — POC OCCULT BLOOD, ED: Fecal Occult Bld: NEGATIVE

## 2022-12-15 LAB — COMPREHENSIVE METABOLIC PANEL
ALT: 16 U/L (ref 0–44)
AST: 19 U/L (ref 15–41)
Albumin: 3.6 g/dL (ref 3.5–5.0)
Alkaline Phosphatase: 73 U/L (ref 38–126)
Anion gap: 10 (ref 5–15)
BUN: 17 mg/dL (ref 8–23)
CO2: 26 mmol/L (ref 22–32)
Calcium: 8.4 mg/dL — ABNORMAL LOW (ref 8.9–10.3)
Chloride: 101 mmol/L (ref 98–111)
Creatinine, Ser: 1.35 mg/dL — ABNORMAL HIGH (ref 0.44–1.00)
GFR, Estimated: 43 mL/min — ABNORMAL LOW (ref >60)
Glucose, Bld: 152 mg/dL — ABNORMAL HIGH (ref 70–99)
Potassium: 3.1 mmol/L — ABNORMAL LOW (ref 3.5–5.1)
Sodium: 137 mmol/L (ref 135–145)
Total Bilirubin: 0.6 mg/dL (ref 0.3–1.2)
Total Protein: 7.3 g/dL (ref 6.5–8.1)

## 2022-12-15 LAB — HEMOGLOBIN AND HEMATOCRIT, BLOOD
HCT: 31.4 % — ABNORMAL LOW (ref 36.0–46.0)
Hemoglobin: 9.6 g/dL — ABNORMAL LOW (ref 12.0–15.0)

## 2022-12-15 LAB — PREPARE RBC (CROSSMATCH)

## 2022-12-15 LAB — GLUCOSE, CAPILLARY
Glucose-Capillary: 197 mg/dL — ABNORMAL HIGH (ref 70–99)
Glucose-Capillary: 155 mg/dL — ABNORMAL HIGH (ref 70–99)

## 2022-12-15 LAB — CBG MONITORING, ED: Glucose-Capillary: 94 mg/dL (ref 70–99)

## 2022-12-15 MED ORDER — OXYCODONE HCL 5 MG PO TABS
5.0000 mg | ORAL_TABLET | ORAL | Status: DC | PRN
  Administered 2022-12-18: 5 mg via ORAL
  Filled 2022-12-15: qty 1

## 2022-12-15 MED ORDER — ALBUTEROL SULFATE (2.5 MG/3ML) 0.083% IN NEBU
2.5000 mg | INHALATION_SOLUTION | Freq: Two times a day (BID) | RESPIRATORY_TRACT | Status: DC
  Administered 2022-12-15 – 2022-12-18 (×6): 2.5 mg via RESPIRATORY_TRACT
  Filled 2022-12-15 (×5): qty 3

## 2022-12-15 MED ORDER — ONDANSETRON HCL 4 MG/2ML IJ SOLN: 4.0000 mg | Freq: Four times a day (QID) | INTRAMUSCULAR | Status: DC | PRN

## 2022-12-15 MED ORDER — SODIUM CHLORIDE 0.9% IV SOLUTION: Freq: Once | INTRAVENOUS | Status: AC

## 2022-12-15 MED ORDER — ONDANSETRON HCL 4 MG PO TABS: 4.0000 mg | ORAL_TABLET | Freq: Four times a day (QID) | ORAL | Status: DC | PRN

## 2022-12-15 MED ORDER — ACETAMINOPHEN 650 MG RE SUPP: 650.0000 mg | Freq: Four times a day (QID) | RECTAL | Status: DC | PRN

## 2022-12-15 MED ORDER — PANTOPRAZOLE SODIUM 40 MG IV SOLR
40.0000 mg | Freq: Once | INTRAVENOUS | Status: AC
  Administered 2022-12-15: 40 mg via INTRAVENOUS
  Filled 2022-12-15: qty 10

## 2022-12-15 MED ORDER — HYDROXYCHLOROQUINE SULFATE 200 MG PO TABS
200.0000 mg | ORAL_TABLET | Freq: Two times a day (BID) | ORAL | Status: DC
  Administered 2022-12-15 – 2022-12-18 (×6): 200 mg via ORAL
  Filled 2022-12-15 (×7): qty 1

## 2022-12-15 MED ORDER — PANTOPRAZOLE SODIUM 40 MG IV SOLR
40.0000 mg | INTRAVENOUS | Status: DC
  Administered 2022-12-16 – 2022-12-17 (×2): 40 mg via INTRAVENOUS
  Filled 2022-12-15 (×2): qty 10

## 2022-12-15 MED ORDER — POTASSIUM CHLORIDE CRYS ER 20 MEQ PO TBCR
40.0000 meq | EXTENDED_RELEASE_TABLET | Freq: Once | ORAL | Status: AC
  Administered 2022-12-15: 40 meq via ORAL
  Filled 2022-12-15: qty 2

## 2022-12-15 MED ORDER — ALBUTEROL SULFATE (2.5 MG/3ML) 0.083% IN NEBU
2.5000 mg | INHALATION_SOLUTION | RESPIRATORY_TRACT | Status: DC | PRN
  Administered 2022-12-15 – 2022-12-16 (×2): 2.5 mg via RESPIRATORY_TRACT
  Filled 2022-12-15 (×2): qty 3

## 2022-12-15 MED ORDER — ALBUTEROL SULFATE (2.5 MG/3ML) 0.083% IN NEBU
INHALATION_SOLUTION | RESPIRATORY_TRACT | Status: AC
  Filled 2022-12-15: qty 3

## 2022-12-15 MED ORDER — TRAZODONE HCL 50 MG PO TABS
25.0000 mg | ORAL_TABLET | Freq: Every evening | ORAL | Status: DC | PRN
  Administered 2022-12-16 – 2022-12-17 (×3): 25 mg via ORAL
  Filled 2022-12-15 (×3): qty 1

## 2022-12-15 MED ORDER — PEG 3350-KCL-NA BICARB-NACL 420 G PO SOLR: 4000.0000 mL | Freq: Once | ORAL | Status: DC

## 2022-12-15 MED ORDER — INSULIN ASPART 100 UNIT/ML IJ SOLN
0.0000 [IU] | Freq: Every day | INTRAMUSCULAR | Status: DC
  Administered 2022-12-16 – 2022-12-17 (×2): 2 [IU] via SUBCUTANEOUS
  Filled 2022-12-15: qty 0.05

## 2022-12-15 MED ORDER — INSULIN ASPART 100 UNIT/ML IJ SOLN
0.0000 [IU] | Freq: Three times a day (TID) | INTRAMUSCULAR | Status: DC
  Administered 2022-12-15 – 2022-12-16 (×3): 3 [IU] via SUBCUTANEOUS
  Administered 2022-12-16 – 2022-12-17 (×2): 2 [IU] via SUBCUTANEOUS
  Administered 2022-12-17 – 2022-12-18 (×3): 3 [IU] via SUBCUTANEOUS
  Filled 2022-12-15: qty 0.15

## 2022-12-15 MED ORDER — ACETAMINOPHEN 325 MG PO TABS
650.0000 mg | ORAL_TABLET | Freq: Four times a day (QID) | ORAL | Status: DC | PRN
  Administered 2022-12-16 (×2): 650 mg via ORAL
  Filled 2022-12-15 (×3): qty 2

## 2022-12-15 NOTE — ED Notes (Signed)
Blood consent signed, at [facility].

## 2022-12-15 NOTE — ED Notes (Signed)
EDP into room, at [facility].  ?

## 2022-12-15 NOTE — ED Provider Notes (Addendum)
[facility] Provider Note   CSN: [id number] Arrival date & time: 12/15/22  0747     History  Chief Complaint  Patient presents with   Abnormal Lab    Lynn [name] is a 67 y.o. female with history of hypertension, hyperlipidemia, chronic diastolic heart failure, CAD, OSA on CPAP, type 2 diabetes, thalassemia, depression, mitral stenosis, paroxysmal A-fib on Eliquis, rheumatoid arthritis who presents the emergency department for abnormal lab results.  Patient states that she was called by her pulmonologist yesterday and told that her routine blood work showed a hemoglobin of 6.  She states that she has been having some dark stools for the past 2 weeks or so.  She stopped her Eliquis last night after receiving hemoglobin results.  Also has not taken morning dose of this.  Otherwise reports ports good compliance.  History of GI bleed requiring blood transfusions in the past, most recently in November 2023.  Patient complaining of some fatigue, shortness of breath worse with exertion, and some mild intermittent chest discomfort.  Intermittent abdominal discomfort, especially with bowel movements.  Mild nausea, no vomiting.  No bright red bowel movements, no rectal discomfort or bright red bleeding per rectum.   Abnormal Lab      Home Medications Prior to Admission medications   Medication Sig Start Date End Date Taking? Authorizing Provider  acetaminophen (TYLENOL) 500 MG tablet Take 500 mg by mouth every 6 (six) hours as needed for fever or moderate pain.   Yes [provider]  albuterol (VENTOLIN HFA) 108 (90 Base) MCG/ACT inhaler Inhale 2 puffs into the lungs every 6 (six) hours as needed for wheezing or shortness of breath. Patient taking differently: Inhale 2 puffs into the lungs every other day. 04/03/21  Yes Mercy Riding, MD  apixaban (ELIQUIS) 5 MG TABS tablet Take 1 tablet (5 mg total) by mouth 2 (two) times daily.  Resume taking on 09/08/22 09/19/22  Yes Leonie Man, MD  atorvastatin (LIPITOR) 80 MG tablet Take 1 tablet (80 mg total) by mouth every evening. 09/19/22  Yes Leonie Man, MD  cetirizine (ZYRTEC) 10 MG tablet Take 10 mg by mouth daily as needed for allergies.   Yes [provider]  diltiazem (CARDIZEM CD) 360 MG 24 hr capsule Take 1 capsule (360 mg total) by mouth daily. 09/19/22  Yes Leonie Man, MD  folic acid (FOLVITE) 1 MG tablet Take 2 tablets (2 mg total) by mouth daily. Patient taking differently: Take 1 mg by mouth in the morning and at bedtime. 01/21/20  Yes Copland, Gay Filler, MD  furosemide (LASIX) 20 MG tablet Decrease Lasix ( furosemide)  to 20 mg ( 1/2/tablet)  , with 20 mg  you may take a an additional 20 mg if need for swelling or weight gain more than 3 lbs overnight or 5 lbs in one week -- if you have continue to take an additional 20 mg , go back to 40 mg  Take additional dose as needed for egt gain> 3 lb in 1 day Patient taking differently: Take 20 mg by mouth daily as needed for fluid. 11/15/21  Yes Shawna Clamp, MD  hydroxychloroquine (PLAQUENIL) 200 MG tablet Take 200 mg by mouth 2 (two) times daily. 05/30/22  Yes [provider]  insulin lispro (HUMALOG) 100 UNIT/ML injection Max Daily 100 units per pump Patient taking differently: Max Daily 100 units per pump per patient 04/12/22  Yes [name], Melanie Crazier, MD  isosorbide mononitrate (IMDUR) 30 MG 24 hr tablet Take 1 tablet (30 mg total) by mouth daily. 09/19/22  Yes Leonie Man, MD  ketoconazole (NIZORAL) 2 % cream Apply 1 Application topically daily. 11/12/22  Yes Lorenda Peck, DPM  leflunomide (ARAVA) 20 MG tablet Take 20 mg by mouth daily. 08/16/21  Yes [provider]  losartan (COZAAR) 100 MG tablet Take 0.5 tablets (50 mg total) by mouth daily. 09/19/22  Yes Leonie Man, MD  Melatonin 5 MG CHEW Chew 5 mg by mouth at bedtime.   Yes [provider]  Multiple  Vitamin (MULTIVITAMIN WITH MINERALS) TABS tablet Take 1 tablet by mouth daily.   Yes [provider]  Oxymetazoline HCl (VICKS SINEX NA) Place 1 spray into the nose daily as needed (congestion).   Yes [provider]  pantoprazole (PROTONIX) 40 MG tablet Take 1 tablet (40 mg total) by mouth 2 (two) times daily before a meal. 09/06/22 12/15/22 Yes Arrien, Jimmy Picket, MD  tirzepatide [facility]) 7.5 MG/0.5ML Pen Inject 7.5 mg into the skin once a week. 10/12/22  Yes [name], Melanie Crazier, MD  traZODone (DESYREL) 50 MG tablet Take 0.5-1 tablets (25-50 mg total) by mouth at bedtime as needed. for sleep Patient taking differently: Take 25-50 mg by mouth at bedtime as needed for sleep. 08/01/22  Yes Copland, Gay Filler, MD  Continuous Blood Gluc Sensor (DEXCOM G6 SENSOR) MISC 1 Device by Does not apply route as directed. 10/19/22   [name], Melanie Crazier, MD  Continuous Blood Gluc Transmit (DEXCOM G6 TRANSMITTER) MISC 1 Device by Does not apply route as directed. 10/19/22   [name], Melanie Crazier, MD  glucose blood (CONTOUR NEXT TEST) test strip 3x daily 07/20/19   [name], Melanie Crazier, MD  Insulin Disposable Pump (OMNIPOD 5 G6 INTRO, GEN 5,) KIT 1 Device by Does not apply route every other day. 04/13/22   [name], Melanie Crazier, MD  Insulin Disposable Pump (OMNIPOD 5 G6 POD, GEN 5,) MISC 1 Device by Does not apply route every other day. 08/13/22   [name], Melanie Crazier, MD  Insulin Pen Needle (B-D UF III MINI PEN NEEDLES) 31G X 5 MM MISC USE DAILY WITH VICTOZA AND LANTUS. Patient taking differently: Use for ozempic 07/20/19   [name], Melanie Crazier, MD  Insulin Pen Needle 31G X 5 MM MISC 1 Device by Does not apply route 3 (three) times daily. 01/21/20   Copland, Gay Filler, MD  Lancets 30G MISC 1 Device by Does not apply route 3 (three) times daily. 07/16/19   Copland, Gay Filler, MD  predniSONE (DELTASONE) 10 MG tablet 4 tabs for 2 days, then 3 tabs for 2  days, 2 tabs for 2 days, then 1 tab for 2 days, then stop 12/14/22   Cobb, Karie Schwalbe, NP  SAFETY-LOK TB SYRINGE 27GX.5" 27G X 1/2" 1 ML MISC  08/18/19   [provider]  sucralfate (CARAFATE) 1 g tablet Take 1 tablet (1 g total) by mouth 2 (two) times daily for 14 days. Patient not taking: Reported on 12/15/2022 09/06/22 09/20/22  Arrien, Jimmy Picket, MD      Allergies    Methotrexate, Penicillins, [name] [dapagliflozin], Metformin and related, and Milk [milk (cow)]    Review of Systems   Review of Systems  Constitutional:  Positive for fatigue.  Respiratory:  Positive for shortness of breath.   Cardiovascular:  Positive for chest pain.  Gastrointestinal:  Positive for abdominal pain, blood in stool, diarrhea and nausea.  All other systems reviewed  and are negative.   Physical Exam Updated Vital Signs BP (!) 141/72   Pulse 81   Temp 98.5 F (36.9 C) (Oral)   Resp 18   LMP  (LMP Unknown)   SpO2 100%  Physical Exam Vitals and nursing note reviewed.  Constitutional:      Appearance: Normal appearance.  HENT:     Head: Normocephalic and atraumatic.  Eyes:     Conjunctiva/sclera: Conjunctivae normal.  Cardiovascular:     Rate and Rhythm: Normal rate and regular rhythm.  Pulmonary:     Effort: Pulmonary effort is normal. No respiratory distress.     Breath sounds: Normal breath sounds.     Comments: On home 3 L oxygen nasal cannula Abdominal:     General: There is no distension.     Palpations: Abdomen is soft.     Tenderness: There is no abdominal tenderness.  Skin:    General: Skin is warm and dry.     Coloration: Skin is pale.  Neurological:     General: No focal deficit present.     Mental Status: She is alert.     ED Results / Procedures / Treatments   Labs (all labs ordered are listed, but only abnormal results are displayed) Labs Reviewed  COMPREHENSIVE METABOLIC PANEL - Abnormal; Notable for the following components:      Result Value    Potassium 3.1 (*)    Glucose, Bld 152 (*)    Creatinine, Ser 1.35 (*)    Calcium 8.4 (*)    GFR, Estimated 43 (*)    All other components within normal limits  CBC - Abnormal; Notable for the following components:   RBC 3.00 (*)    Hemoglobin 5.7 (*)    HCT 20.1 (*)    MCV 67.0 (*)    MCH 19.0 (*)    MCHC 28.4 (*)    RDW 20.8 (*)    nRBC 0.4 (*)    All other components within normal limits  POC OCCULT BLOOD, ED  TYPE AND SCREEN  PREPARE RBC (CROSSMATCH)    EKG None  Radiology No results found.  Procedures .Critical Care  Performed by: Kateri Plummer, PA-C Authorized by: Kateri Plummer, PA-C   Critical care provider statement:    Critical care time (minutes):  30   Critical care was necessary to treat or prevent imminent or life-threatening deterioration of the following conditions:  Circulatory failure   Critical care was time spent personally by me on the following activities:  Development of treatment plan with patient or surrogate, discussions with consultants, evaluation of patient's response to treatment, examination of patient, ordering and review of laboratory studies, ordering and review of radiographic studies, ordering and performing treatments and interventions, pulse oximetry, re-evaluation of patient's condition and review of old charts   Care discussed with: admitting provider   Comments:     Severe anemia with hgb 5.5 requiring blood transfusion     Medications Ordered in ED Medications  pantoprazole (PROTONIX) injection 40 mg (has no administration in time range)  0.9 %  sodium chloride infusion (Manually program via Guardrails IV Fluids) (has no administration in time range)    ED Course/ Medical Decision Making/ A&P                             Medical Decision Making Amount and/or Complexity of Data Reviewed Labs: ordered.  Risk Prescription drug management. Decision regarding  hospitalization.  This patient is a 67 y.o. female who  presents to the ED for concern of anemia per outpatient labs, this involves an extensive number of treatment options, and is a complaint that carries with it a high risk of complications and morbidity.   Past Medical History / Co-morbidities / Social History: hypertension, hyperlipidemia, chronic diastolic heart failure, CAD, OSA on CPAP, type 2 diabetes, thalassemia, depression, mitral stenosis, paroxysmal A-fib on Eliquis, rheumatoid arthritis  Additional history: Chart reviewed. Pertinent results include: Patient hospitalized in November 2023 for symptomatic anemia.  Had dark stools and hemoglobin drop requiring 2 units of blood while in the ER.  Had EGD performed showing 2 nonbleeding lesions in the stomach, and a single nonbleeding lesion in the proximal jejunum.  Hemoglobin levels from pulmonologist yesterday with hemoglobin 6.2, hematocrit 20.3, MCV 63.8. BNP 164.   Physical Exam: Physical exam performed. The pertinent findings include: Hypertensive, otherwise normal vital signs.  Appears pale.  Out of breath with minimal exertion.  On home 3 L nasal cannula.    Lab Tests: I ordered, and personally interpreted labs.  The pertinent results include: No leukocytosis, hemoglobin 5.7, hematocrit 20.1, MCV 67.  Creatinine 1.35, up from 1.15 yesterday. Otherwise CMP unremarkable. Hemoccult negative.    Medications: I ordered medication including IV protonix and 3 units PRBCs  for severe symptomatic anemia. I have reviewed the patients home medicines and have made adjustments as needed.  Consultations Obtained: I requested consultation with gastroenterologist. Have not heard back at time of admission. 1033 -- Dr Collene Mares with gastroenterology will follow on the floor while admitted. Did not have any further recommendations at this time.  I consulted with [facility] Dr Renaee Munda who will admit.    Disposition: After consideration of the diagnostic results and the patients response to treatment,  I feel that patient is requiring medical admission for severe anemia. She is stable from a hemodynamic standpoint at this time and is not requiring ICU level of care.   I discussed this case with my attending physician Dr. Johnney Killian who cosigned this note including patient's presenting symptoms, physical exam, and planned diagnostics and interventions. Attending physician stated agreement with plan or made changes to plan which were implemented.    Final Clinical Impression(s) / ED Diagnoses Final diagnoses:  Severe anemia  Dark stools    Rx / DC Orders ED Discharge Orders     None      Portions of this report may have been transcribed using voice recognition software. Every effort was made to ensure accuracy; however, inadvertent computerized transcription errors may be present.    Estill Cotta 12/15/22 [id number]    Kateri Plummer, PA-C 12/15/22 1034    Charlesetta Shanks, MD 12/15/22 1244

## 2022-12-15 NOTE — ED Triage Notes (Signed)
Pt arrived via POV, states she was called by PCP and told her hemoglobin was 6. States stools have been dark, hx of GI bleed requiring transfusions in the past.

## 2022-12-15 NOTE — Consult Note (Addendum)
CROSS COVER [facility] Reason for Consult: Melena and severe anemia  Referring Physician: THP.  Lynn [name] is an 67 y.o. female.  HPI: Patient is a 67 year old black female, with multiple medical issues listed below, who gives a 10 day history of melena, was seen and evaluated by Dr. Brand Males yesterday for shortness of breath, generalized weakness & lethargy and was noted to have a hemoglobin of 6.2 gms/dl-she was advised to come to the ED for admission. She was admitted with similar problems in November last year when she had a enteroscopy done by Dr. Rush Landmark and two gastric AVM's were ablated. She had a colonoscopy in 2017 by Dr. Fuller Plan when scattered diverticula were noted and a small polyp was removed. She is on Eliquis for PAF. In the ER her hemoglobin was 5.7 gms/dl. She is receiving PRBC's now.  Past Medical History:  Diagnosis Date   Anxiety    Clotting disorder ([place])    blood clot in eye 2016   Coronary artery disease, non-occlusive    a. 11/2016 NSTEMI/Cath: LM nl, LAD 40p, 63m, D1/2 small, LCX 40ost, OM2/3 nl/small, RCA 35 ost/mid, RPDA/RPL small/nl.   Coronary vasospasm ([facility])    Depression    Diastolic dysfunction    a. 11/2016 Echo: EF 55-60%, gr1 DD, sev calcified MV annulus, mildly dil LA.   Eye hemorrhage 01/2015   "right; resolved" (07/03/2017)   Headache    "monthly" (07/03/2017)   History of blood transfusion    "when I had an ectopic pregnancy"   History of stomach ulcers    Hyperlipidemia    Hypertension    Microcytic anemia    Moderate mitral stenosis by prior echocardiogram 03/2021   TTE: Mod MS (mean gradient 9 mmHg). -- TEE Moderate-Severe MS (mean gradient 14 mmHg) with fixed Post MV Leaflet & Severe MAC w/ large circumscribed calcified mass on the Post MV Leaflet. (Mass previously described in 2018) - CMRI identifies calcified mass & not Tumor.; R&LHC - LVEDP & PCWP both 23 mmHg indicating minimal resting gradient   OSA on CPAP    setting is  unknown   PAF (paroxysmal atrial fibrillation) ([facility] 03/2021   Event Monitor Aug-Sept 2022: Predominantly sinus rhythm.  1% A. fib burden.  30 brief episodes of PAT.  2 short bursts of NSVT.   PAT (paroxysmal atrial tachycardia)    Event monitor from August 2022 showed 30 brief bursts longest 14 seconds.   Pneumonia    "several times" (07/03/2017)   PVC's (premature ventricular contractions)    [place]umatoid arthritis ([facility]    Syncope 07/03/2017 X 2   called seizures but no medications   Thalassemia    "my cells are sickle cell shaped but I don't have sickle cell anemia" (07/03/2017)   Type II diabetes mellitus ([facility]    Past Surgical History:  Procedure Laterality Date   48-Hour Monitor  03/18/2017   Sinus rhythm with sinus tachycardia (rate 58-134 BPM)multiple PVCs noted with couplets and bigeminy. One triplet. 7 runs of PAT ranging from 100-130 bpm. Longest was 33 beats.   BIOPSY  04/03/2021   Procedure: BIOPSY;  Surgeon: AYetta Flock MD;  [facility];  Service: Gastroenterology;;   BREAST BIOPSY Left    BRONCHIAL WASHINGS  03/28/2021   Procedure: BRONCHIAL WASHINGS;  Surgeon: CJulian Hy DO;  [facility]  Service: Endoscopy;;   CARDIAC CATHETERIZATION N/A 11/19/2016   Procedure: Left Heart Cath and Coronary Angiography;  Surgeon: HBelva Crome MD;  Location: [facility];  Service: Cardiovascular.    LM nl, LAD 40p, 58m, D1/2 small, LCX 40ost, OM2/3 nl/small, RCA 35 ost/mid, RPDA/RPL small/nl.   CARDIAC MRI  03/30/2021   Normal LVEF ~53%. 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 No Scar or Late Gadolinium Enhancement on LV Myocardium.   CARPAL TUNNEL RELEASE Right 11/01/2014   COLONOSCOPY     DILATION AND CURETTAGE OF UTERUS     ECTOPIC PREGNANCY SURGERY  X 2   ENTEROSCOPY N/A 09/05/2022   Procedure: ENTEROSCOPY;  Surgeon: MRush LandmarkGTelford Nab, MD;  [facility];  Service:  Gastroenterology;  Laterality: N/A;   ESOPHAGOGASTRODUODENOSCOPY (EGD) WITH PROPOFOL N/A 04/03/2021   Procedure: ESOPHAGOGASTRODUODENOSCOPY (EGD) WITH PROPOFOL;  Surgeon: AYetta Flock MD;  [facility];  Service: Gastroenterology;  Laterality: N/A;   ESOPHAGOGASTRODUODENOSCOPY (EGD) WITH PROPOFOL N/A 08/15/2021   Procedure: ESOPHAGOGASTRODUODENOSCOPY (EGD) WITH PROPOFOL;  Surgeon: SLadene Artist MD;  [facility];  Service: Endoscopy;  Laterality: N/A;   HOT HEMOSTASIS N/A 09/05/2022   Procedure: HOT HEMOSTASIS (ARGON PLASMA COAGULATION/BICAP);  Surgeon: MIrving Copas, MD;  [facility];  Service: Gastroenterology;  Laterality: N/A;   JOINT REPLACEMENT     KNEE ARTHROSCOPY Left 11/2014   RIGHT/LEFT HEART CATH AND CORONARY ANGIOGRAPHY N/A 08/08/2021   Procedure: RIGHT/LEFT HEART CATH AND CORONARY ANGIOGRAPHY;  Surgeon: HLeonie Man MD;  [facility];  Service: Cardiovascular;Ost LCx 40%. Ost-Prox RCA 35%. Prox-Mid LAD 25%-<25% D2. Mod-Severely Elevated LVEDP. Mild PA HTN -> PA mean 31 mmHg with a PCWP and LVEDP of 23 mmHg   SUBMUCOSAL TATTOO INJECTION  09/05/2022   Procedure: SUBMUCOSAL TATTOO INJECTION;  Surgeon: MIrving Copas, MD;  [facility];  Service: Gastroenterology;;   TEE WITHOUT CARDIOVERSION N/A 03/28/2021   Procedure: TRANSESOPHAGEAL ECHOCARDIOGRAM (TEE);  Surgeon: NThayer Headings MD;  [facility];; LVEF 55-60%. Normal LV Fxn & no RWMA. No LAA thrombus. Gr II Ao Plaque. Large circumscribed calcific mass (1.8 x 1.4 cm) anterior to posterior annulus.  Fixed posterior leaflet with Mod-Severe MS (Mean MVG ~14 mmHg, Peak 20 mmhg).  Mild MR   TONSILLECTOMY     TOTAL KNEE ARTHROPLASTY Right 02/18/2015   Procedure: RIGHT TOTAL KNEE ARTHROPLASTY;  Surgeon: SNetta Cedars MD;  [facility]  Service: Orthopedics;  Laterality: Right;   TOTAL KNEE ARTHROPLASTY Left 08/19/2015   Procedure:  LEFT TOTAL KNEE ARTHROPLASTY;  Surgeon: SNetta Cedars MD;  [facility]  Service: Orthopedics;  Laterality: Left;   TRANSTHORACIC ECHOCARDIOGRAM  11/2016; 07/2017:   a) normal LV size, thickness and function. EF 55-60%. GR 1 DD.No RWMA severe mitral annular calcification, but no mitral stenosis. Mild LA dilation;; b) normal LV size and function.  EF 65-75%.  Unable to assess diastolic function.  Aortic sclerosis with no stenosis.  Moderate mitral stenosis? (Gradient 9 mmHg) -?  Mild-mod left atrial enlargement    TRANSTHORACIC ECHOCARDIOGRAM  01/2019   EF 65 to 70%.  Normal function.  Elevated LVEDP-GR 1 DD.  Normal RV size and function.  Large calcific mass on posterior mitral leaflet mild to moderate mitral stenosis.   TRANSTHORACIC ECHOCARDIOGRAM  03/24/2021   EF 60 to 65%.  LV function with normal wall motion.  Moderate basal septal LVH.  GRII DD & Mild LA dilation.-> elevated LVEDP.  Normal RV function.  Mildly elevated PAP.  Ill-defined density measuring 3.23 x 2.1 cm region of the posterior MV leaflet  along with dense MAC.  Moderate MS (mean MVG of 9 mmHg.) Normal AoV & RAP. --> Recommend TEE 2/2 concern for MV SBE.   TRANSTHORACIC ECHOCARDIOGRAM  04/26/2022   EF 60 to 65%.  No RWMA.  Moderate LVH with GR 1 DD.  Normal RV size and function.  Mild LA dilation.  Large calcific mass of posterior MV leaflet.  Mean PG 13 mm 3.  Trivial MR.  Mild to moderate MS.  Severe MAC.  Normal AOV.  Normal RAP.   VAGINAL HYSTERECTOMY     VIDEO BRONCHOSCOPY N/A 03/28/2021   Procedure: VIDEO BRONCHOSCOPY WITHOUT FLUORO;  Surgeon: Julian Hy, DO;  Location[facility];  Service: Endoscopy;  Laterality: N/A;    Family History  Problem Relation Age of Onset   Cancer Mother    Diabetes Mother    Hypertension Mother    Hyperlipidemia Mother    Cancer Father    Hyperlipidemia Father    Mental illness Sister    Diabetes Sister    Diabetes Maternal Grandmother    Diabetes Maternal Grandfather     Colon cancer Neg Hx    Esophageal cancer Neg Hx    Stomach cancer Neg Hx    Rectal cancer Neg Hx    Tremor Neg Hx    Parkinson's disease Neg Hx    Social History:  reports that she quit smoking about 7 years ago. Her smoking use included cigarettes. She has a 11.00 pack-year smoking history. She has never used smokeless tobacco. She reports that she does not currently use alcohol. She reports that she does not use drugs.  Allergies:  Allergies  Allergen Reactions   Methotrexate Other (See Comments)    Pneumonitis   Penicillins Hives    Childhood allergy Has patient had a PCN reaction causing immediate rash, facial/tongue/throat swelling, SOB or lightheadedness with hypotension: Yes Has patient had a PCN reaction causing severe rash involving mucus membranes or skin necrosis: No Has patient had a PCN reaction that required hospitalization No Has patient had a PCN reaction occurring within the last 10 years: No If all of the above answers are "NO", then may proceed with Cephalosporin use.   [name] [Dapagliflozin] Other (See Comments)    Dizzy and lethargic    Metformin And Related Other (See Comments)    Diarrhea, bleeding   Milk [Milk (Cow)] Diarrhea    Milk and dairy products   Medications: I have reviewed the patient's current medications. Prior to Admission:  Medications Prior to Admission  Medication Sig Dispense Refill Last Dose   acetaminophen (TYLENOL) 500 MG tablet Take 500 mg by mouth every 6 (six) hours as needed for fever or moderate pain.   unk   albuterol (VENTOLIN HFA) 108 (90 Base) MCG/ACT inhaler Inhale 2 puffs into the lungs every 6 (six) hours as needed for wheezing or shortness of breath. (Patient taking differently: Inhale 2 puffs into the lungs every other day.) 8 g 2 unk   apixaban (ELIQUIS) 5 MG TABS tablet Take 1 tablet (5 mg total) by mouth 2 (two) times daily. Resume taking on 09/08/22 180 tablet 3 12/14/2022 at 0800   atorvastatin (LIPITOR) 80 MG tablet  Take 1 tablet (80 mg total) by mouth every evening. 90 tablet 3 12/15/2022   cetirizine (ZYRTEC) 10 MG tablet Take 10 mg by mouth daily as needed for allergies.   unk   diltiazem (CARDIZEM CD) 360 MG 24 hr capsule Take 1 capsule (360 mg total) by mouth daily. [place]  capsule 3 [id number]   folic acid (FOLVITE) 1 MG tablet Take 2 tablets (2 mg total) by mouth daily. (Patient taking differently: Take 1 mg by mouth in the morning and at bedtime.) 180 tablet 3 12/15/2022   furosemide (LASIX) 20 MG tablet Decrease Lasix ( furosemide)  to 20 mg ( 1/2/tablet)  , with 20 mg  you may take a an additional 20 mg if need for swelling or weight gain more than 3 lbs overnight or 5 lbs in one week -- if you have continue to take an additional 20 mg , go back to 40 mg  Take additional dose as needed for egt gain> 3 lb in 1 day (Patient taking differently: Take 20 mg by mouth daily as needed for fluid.)   unk   hydroxychloroquine (PLAQUENIL) 200 MG tablet Take 200 mg by mouth 2 (two) times daily.   12/15/2022   insulin lispro (HUMALOG) 100 UNIT/ML injection Max Daily 100 units per pump (Patient taking differently: Max Daily 100 units per pump per patient) 90 mL 3 12/15/2022   isosorbide mononitrate (IMDUR) 30 MG 24 hr tablet Take 1 tablet (30 mg total) by mouth daily. 90 tablet 3 12/15/2022   ketoconazole (NIZORAL) 2 % cream Apply 1 Application topically daily. 60 g 2 unk   leflunomide (ARAVA) 20 MG tablet Take 20 mg by mouth daily.   12/15/2022   losartan (COZAAR) 100 MG tablet Take 0.5 tablets (50 mg total) by mouth daily. 90 tablet 3 12/15/2022   Melatonin 5 MG CHEW Chew 5 mg by mouth at bedtime.   Past Week   Multiple Vitamin (MULTIVITAMIN WITH MINERALS) TABS tablet Take 1 tablet by mouth daily.   12/15/2022   Oxymetazoline HCl (VICKS SINEX NA) Place 1 spray into the nose daily as needed (congestion).   unk   pantoprazole (PROTONIX) 40 MG tablet Take 1 tablet (40 mg total) by mouth 2 (two) times daily before a meal. 60 tablet 0 12/14/2022    tirzepatide (MOUNJARO) 7.5 MG/0.5ML Pen Inject 7.5 mg into the skin once a week. 6 mL 3 Past Week   traZODone (DESYREL) 50 MG tablet Take 0.5-1 tablets (25-50 mg total) by mouth at bedtime as needed. for sleep (Patient taking differently: Take 25-50 mg by mouth at bedtime as needed for sleep.) 90 tablet 2 unk   Continuous Blood Gluc Sensor (DEXCOM G6 SENSOR) MISC 1 Device by Does not apply route as directed. 3 each 6    Continuous Blood Gluc Transmit (DEXCOM G6 TRANSMITTER) MISC 1 Device by Does not apply route as directed. 1 each 3    glucose blood (CONTOUR NEXT TEST) test strip 3x daily 300 each 11    Insulin Disposable Pump (OMNIPOD 5 G6 INTRO, GEN 5,) KIT 1 Device by Does not apply route every other day. 1 kit 0    Insulin Disposable Pump (OMNIPOD 5 G6 POD, GEN 5,) MISC 1 Device by Does not apply route every other day. 30 each 3    Insulin Pen Needle (B-D UF III MINI PEN NEEDLES) 31G X 5 MM MISC USE DAILY WITH VICTOZA AND LANTUS. (Patient taking differently: Use for ozempic) 400 each 1    Insulin Pen Needle 31G X 5 MM MISC 1 Device by Does not apply route 3 (three) times daily. 150 each 11    Lancets 30G MISC 1 Device by Does not apply route 3 (three) times daily. 300 each 9    predniSONE (DELTASONE) 10 MG tablet 4 tabs for 2  days, then 3 tabs for 2 days, 2 tabs for 2 days, then 1 tab for 2 days, then stop 20 tablet 0    SAFETY-LOK TB SYRINGE 27GX.5" 27G X 1/2" 1 ML MISC       sucralfate (CARAFATE) 1 g tablet Take 1 tablet (1 g total) by mouth 2 (two) times daily for 14 days. (Patient not taking: Reported on 12/15/2022) 28 tablet 0 Completed Course   Scheduled:  hydroxychloroquine  200 mg Oral BID   insulin aspart  0-15 Units Subcutaneous TID WC   insulin aspart  0-5 Units Subcutaneous QHS   [START ON 12/16/2022] pantoprazole (PROTONIX) IV  40 mg Intravenous Q24H   polyethylene glycol-electrolytes  4,000 mL Oral Once   Continuous: [id number] **OR** acetaminophen, ondansetron **OR**  ondansetron (ZOFRAN) IV, oxyCODONE, traZODone  Results for orders placed or performed during the hospital encounter of 12/15/22 (from the past 48 hour(s))  Comprehensive metabolic panel     Status: Abnormal   Collection Time: 12/15/22  8:06 AM  Result Value Ref Range   Sodium 137 135 - 145 mmol/L   Potassium 3.1 (L) 3.5 - 5.1 mmol/L   Chloride 101 98 - 111 mmol/L   CO2 26 22 - 32 mmol/L   Glucose, Bld 152 (H) 70 - 99 mg/dL    Comment: Glucose reference range applies only to samples taken after fasting for at least 8 hours.   BUN 17 8 - 23 mg/dL   Creatinine, Ser 1.35 (H) 0.44 - 1.00 mg/dL   Calcium 8.4 (L) 8.9 - 10.3 mg/dL   Total Protein 7.3 6.5 - 8.1 g/dL   Albumin 3.6 3.5 - 5.0 g/dL   AST 19 15 - 41 U/L   ALT 16 0 - 44 U/L   Alkaline Phosphatase 73 38 - 126 U/L   Total Bilirubin 0.6 0.3 - 1.2 mg/dL   GFR, Estimated 43 (L) >60 mL/min    Comment: (NOTE) Calculated using the CKD-EPI Creatinine Equation (2021)    Anion gap 10 5 - 15    Comment: Performed at [facility], [place] [address] [place], [place] [address]  CBC     Status: Abnormal   Collection Time: 12/15/22  8:06 AM  Result Value Ref Range   WBC 8.5 4.0 - 10.5 K/uL   RBC 3.00 (L) 3.87 - 5.11 MIL/uL   Hemoglobin 5.7 (LL) 12.0 - 15.0 g/dL    Comment: REPEATED TO VERIFY Reticulocyte Hemoglobin testing may be clinically indicated, consider ordering this additional test [id number] THIS CRITICAL RESULT HAS VERIFIED AND BEEN CALLED TO SAVOIE,B RN BY GOLSON,M ON 03 02 2024 AT 0907, AND HAS BEEN READ BACK.     HCT 20.1 (L) 36.0 - 46.0 %   MCV 67.0 (L) 80.0 - 100.0 fL   MCH 19.0 (L) 26.0 - 34.0 pg   MCHC 28.4 (L) 30.0 - 36.0 g/dL   RDW 20.8 (H) 11.5 - 15.5 %   Platelets 285 150 - 400 K/uL   nRBC 0.4 (H) 0.0 - 0.2 %    Comment: Performed at [facility], [place] [address], [place], [place] [address]  POC occult blood, ED     Status: None   Collection Time: 12/15/22  9:24 AM  Result Value  Ref Range   Fecal Occult Bld NEGATIVE NEGATIVE    No results found.  Review of Systems  Constitutional:  Positive for activity change and fatigue. Negative for appetite change, chills, diaphoresis, fever and unexpected weight change.  HENT: Negative.    Eyes: Negative.   Respiratory:  Positive for shortness of breath. Negative for apnea, cough, choking, chest tightness, wheezing and stridor.   Cardiovascular:  Positive for palpitations.  Gastrointestinal:  Positive for blood in stool.  Endocrine: Positive for polyphagia.  Genitourinary: Negative.   Musculoskeletal:  Positive for arthralgias and back pain.  Skin: Negative.   Allergic/Immunologic: Negative.   Neurological:  Positive for weakness.  Hematological: Negative.   Psychiatric/Behavioral: Negative.     Blood pressure (!) 137/49, pulse 79, temperature 98.5 F (36.9 C), temperature source Oral, resp. rate 18, SpO2 99 %. Physical Exam Constitutional:      Appearance: She is ill-appearing.  HENT:     Head: Normocephalic and atraumatic.     Mouth/Throat:     Mouth: Mucous membranes are dry.  Eyes:     Pupils: Pupils are equal, round, and reactive to light.  Cardiovascular:     Rate and Rhythm: Normal rate. Rhythm irregular.     Pulses: Normal pulses.  Pulmonary:     Effort: Pulmonary effort is normal.  Abdominal:     General: Bowel sounds are normal. There is no distension.     Palpations: Abdomen is soft.     Tenderness: There is no abdominal tenderness. There is no guarding.  Musculoskeletal:        General: Normal range of motion.     Cervical back: Normal range of motion.  Neurological:     General: No focal deficit present.     Mental Status: She is alert and oriented to person, place, and time.  Psychiatric:        Mood and Affect: Mood normal.        Behavior: Behavior normal.        Thought Content: Thought content normal.        Judgment: Judgment normal.   Assessment/Plan: 1) Severe iron deficiency  anemia-ABLA melena-history of gastric AVM's and gastric ulcers; repeat EGD planned for 12/17/22 after her Eliquis has been held for 3 days. She held her Eliquis last night. 2) Colonic diverticulosis. 3)  AKI-probably due to dehydration. 4) Chronic diastolic CHF/HTN. 5) AODM. 6)  Hyperlipidemia. 7) Idiopathic pulmonary fibrosis/OSA. 8) Morbid obesity. 9) Rheumatoid arthritis. Juanita Craver 12/15/2022, 11:11 AM

## 2022-12-15 NOTE — H&P (Signed)
History and Physical  Lynn [name] [id number] DOB: 11-03-1955 DOA: 12/15/2022   PCP: [name], MD   Patient coming from: Home   Chief Complaint: Labs   HPI: Lynn [name] is a 67 y.o. female with medical history significant for coronary artery disease, anxiety, depression, chronic diastolic dysfunction, gastric ulcers, hypertension, hyperlipidemia being admitted to the hospital for evaluation and management of melena and acute blood loss anemia.  Patient states that she had a regular follow-up with her pulmonologist yesterday, where lab work was done.  She was found to have a hemoglobin of 8.2 and instructed to come to the emergency department.  She held her Eliquis last night, elected to come to the ER this morning, where on recheck her hemoglobin was 5.7.  In retrospect, she tells me that she has had pretty persistent melena for the last 2 to 3 weeks.  She has continued to take her Eliquis until yesterday.  She denies any chest pain, though she did have some intermittent lethargy over the last few weeks.  She has never vomited blood, has never noticed any bright red blood in her stools.  Here in the emergency department, lab work showed normal white blood cell count, microcytic anemia, hemoglobin 5.7, normal platelets.  Creatinine is up to 1.35 from baseline of about 1.1, potassium 3.1.  Fecal occult blood test was negative in the emergency department.  Due to her severe symptomatic anemia, hospitalist was contacted for admission, gastroenterology was consulted by ED provider.  Orders have been placed for 3 unit blood transfusion.  Patient is currently resting comfortably in the ER with her husband at the bedside.  Review of Systems: Please see HPI for pertinent positives and negatives. A complete 10 system review of systems are otherwise negative.  Past Medical History:  Diagnosis Date   Anxiety    Clotting disorder ([place])    blood clot in eye 2016   Coronary  artery disease, non-occlusive    a. 11/2016 NSTEMI/Cath: LM nl, LAD 40p, 75m, D1/2 small, LCX 40ost, OM2/3 nl/small, RCA 35 ost/mid, RPDA/RPL small/nl.   Coronary vasospasm (HCC)    Depression    Diastolic dysfunction    a. 11/2016 Echo: EF 55-60%, gr1 DD, sev calcified MV annulus, mildly dil LA.   Eye hemorrhage 01/2015   "right; resolved" (07/03/2017)   Headache    "monthly" (07/03/2017)   History of blood transfusion    "when I had an ectopic pregnancy"   History of stomach ulcers    Hyperlipidemia    Hypertension    Microcytic anemia    Moderate mitral stenosis by prior echocardiogram 03/2021   TTE: Mod MS (mean gradient 9 mmHg). -- TEE Moderate-Severe MS (mean gradient 14 mmHg) with fixed Post MV Leaflet & Severe MAC w/ large circumscribed calcified mass on the Post MV Leaflet. (Mass previously described in 2018) - CMRI identifies calcified mass & not Tumor.; R&LHC - LVEDP & PCWP both 23 mmHg indicating minimal resting gradient   OSA on CPAP    setting is unknown   PAF (paroxysmal atrial fibrillation) ([place] 03/2021   Event Monitor Aug-Sept 2022: Predominantly sinus rhythm.  1% A. fib burden.  30 brief episodes of PAT.  2 short bursts of NSVT.   PAT (paroxysmal atrial tachycardia)    Event monitor from August 2022 showed 30 brief bursts longest 14 seconds.   Pneumonia    "several times" (07/03/2017)   PVC's (premature ventricular contractions)    Rheumatoid arthritis ([place]  Syncope 07/03/2017 X 2   called seizures but no medications   Thalassemia    "my cells are sickle cell shaped but I don't have sickle cell anemia" (07/03/2017)   Type II diabetes mellitus ([place])    Past Surgical History:  Procedure Laterality Date   48-Hour Monitor  03/18/2017   Sinus rhythm with sinus tachycardia (rate 58-134 BPM)multiple PVCs noted with couplets and bigeminy. One triplet. 7 runs of PAT ranging from 100-130 bpm. Longest was 33 beats.   BIOPSY  04/03/2021   Procedure: BIOPSY;  Surgeon:  Yetta Flock, MD;  Location: [facility];  Service: Gastroenterology;;   BREAST BIOPSY Left    BRONCHIAL WASHINGS  03/28/2021   Procedure: BRONCHIAL WASHINGS;  Surgeon: Julian Hy, DO;  Location: [facility];  Service: Endoscopy;;   CARDIAC CATHETERIZATION N/A 11/19/2016   Procedure: Left Heart Cath and Coronary Angiography;  Surgeon: Belva Crome, MD;  Location: [facility];  Service: Cardiovascular.    LM nl, LAD 40p, 62m, D1/2 small, LCX 40ost, OM2/3 nl/small, RCA 35 ost/mid, RPDA/RPL small/nl.   CARDIAC MRI  03/30/2021   Normal LVEF ~53%. 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 No Scar or Late Gadolinium Enhancement on LV Myocardium.   CARPAL TUNNEL RELEASE Right 11/01/2014   COLONOSCOPY     DILATION AND CURETTAGE OF UTERUS     ECTOPIC PREGNANCY SURGERY  X 2   ENTEROSCOPY N/A 09/05/2022   Procedure: ENTEROSCOPY;  Surgeon: MRush LandmarkGTelford Nab, MD;  [facility];  Service: Gastroenterology;  Laterality: N/A;   ESOPHAGOGASTRODUODENOSCOPY (EGD) WITH PROPOFOL N/A 04/03/2021   Procedure: ESOPHAGOGASTRODUODENOSCOPY (EGD) WITH PROPOFOL;  Surgeon: AYetta Flock MD;  [facility]: [facility];  Service: Gastroenterology;  Laterality: N/A;   ESOPHAGOGASTRODUODENOSCOPY (EGD) WITH PROPOFOL N/A 08/15/2021   Procedure: ESOPHAGOGASTRODUODENOSCOPY (EGD) WITH PROPOFOL;  Surgeon: SLadene Artist MD;  Location: [facility];  Service: Endoscopy;  Laterality: N/A;   HOT HEMOSTASIS N/A 09/05/2022   Procedure: HOT HEMOSTASIS (ARGON PLASMA COAGULATION/BICAP);  Surgeon: MIrving Copas, MD;  [facility];  Service: Gastroenterology;  Laterality: N/A;   JOINT REPLACEMENT     KNEE ARTHROSCOPY Left 11/2014   RIGHT/LEFT HEART CATH AND CORONARY ANGIOGRAPHY N/A 08/08/2021   Procedure: RIGHT/LEFT HEART CATH AND CORONARY ANGIOGRAPHY;  Surgeon: HLeonie Man MD;  [facility];  Service:  Cardiovascular;Ost LCx 40%. Ost-Prox RCA 35%. Prox-Mid LAD 25%-<25% D2. Mod-Severely Elevated LVEDP. Mild PA HTN -> PA mean 31 mmHg with a PCWP and LVEDP of 23 mmHg   SUBMUCOSAL TATTOO INJECTION  09/05/2022   Procedure: SUBMUCOSAL TATTOO INJECTION;  Surgeon: MIrving Copas, MD;  Location: [facility];  Service: Gastroenterology;;   TEE WITHOUT CARDIOVERSION N/A 03/28/2021   Procedure: TRANSESOPHAGEAL ECHOCARDIOGRAM (TEE);  Surgeon: NThayer Headings MD;  [facility];; LVEF 55-60%. Normal LV Fxn & no RWMA. No LAA thrombus. Gr II Ao Plaque. Large circumscribed calcific mass (1.8 x 1.4 cm) anterior to posterior annulus.  Fixed posterior leaflet with Mod-Severe MS (Mean MVG ~14 mmHg, Peak 20 mmhg).  Mild MR   TONSILLECTOMY     TOTAL KNEE ARTHROPLASTY Right 02/18/2015   Procedure: RIGHT TOTAL KNEE ARTHROPLASTY;  Surgeon: SNetta Cedars MD;  [place] [facility]  Service: Orthopedics;  Laterality: Right;   TOTAL KNEE ARTHROPLASTY Left 08/19/2015   Procedure: LEFT TOTAL KNEE ARTHROPLASTY;  Surgeon: SNetta Cedars MD;  [place] [facility]  Service: Orthopedics;  Laterality: Left;   TRANSTHORACIC ECHOCARDIOGRAM  11/2016; 07/2017:   a) normal LV size, thickness and function. EF 55-60%. GR 1 DD.No RWMA severe mitral annular calcification, but no mitral stenosis. Mild LA dilation;; b) normal LV size and function.  EF 65-75%.  Unable to assess diastolic function.  Aortic sclerosis with no stenosis.  Moderate mitral stenosis? (Gradient 9 mmHg) -?  Mild-mod left atrial enlargement    TRANSTHORACIC ECHOCARDIOGRAM  01/2019   EF 65 to 70%.  Normal function.  Elevated LVEDP-GR 1 DD.  Normal RV size and function.  Large calcific mass on posterior mitral leaflet mild to moderate mitral stenosis.   TRANSTHORACIC ECHOCARDIOGRAM  03/24/2021   EF 60 to 65%.  LV function with normal wall motion.  Moderate basal septal LVH.  GRII DD & Mild LA dilation.-> elevated LVEDP.  Normal RV function.  Mildly elevated PAP.   Ill-defined density measuring 3.23 x 2.1 cm region of the posterior MV leaflet along with dense MAC.  Moderate MS (mean MVG of 9 mmHg.) Normal AoV & RAP. --> Recommend TEE 2/2 concern for MV SBE.   TRANSTHORACIC ECHOCARDIOGRAM  04/26/2022   EF 60 to 65%.  No RWMA.  Moderate LVH with GR 1 DD.  Normal RV size and function.  Mild LA dilation.  Large calcific mass of posterior MV leaflet.  Mean PG 13 mm 3.  Trivial MR.  Mild to moderate MS.  Severe MAC.  Normal AOV.  Normal RAP.   VAGINAL HYSTERECTOMY     VIDEO BRONCHOSCOPY N/A 03/28/2021   Procedure: VIDEO BRONCHOSCOPY WITHOUT FLUORO;  Surgeon: Julian Hy, DO;  Location: [facility];  Service: Endoscopy;  Laterality: N/A;    Social History:  reports that she quit smoking about 7 years ago. Her smoking use included cigarettes. She has a 11.00 pack-year smoking history. She has never used smokeless tobacco. She reports that she does not currently use alcohol. She reports that she does not use drugs.   Allergies  Allergen Reactions   Methotrexate Other (See Comments)    Pneumonitis   Penicillins Hives    Childhood allergy Has patient had a PCN reaction causing immediate rash, facial/tongue/throat swelling, SOB or lightheadedness with hypotension: Yes Has patient had a PCN reaction causing severe rash involving mucus membranes or skin necrosis: No Has patient had a PCN reaction that required hospitalization No Has patient had a PCN reaction occurring within the last 10 years: No If all of the above answers are "NO", then may proceed with Cephalosporin use.   [name] [Dapagliflozin] Other (See Comments)    Dizzy and lethargic    Metformin And Related Other (See Comments)    Diarrhea, bleeding   Milk [Milk ([place])] Diarrhea    Milk and dairy products    Family History  Problem Relation Age of Onset   Cancer Mother    Diabetes Mother    Hypertension Mother    Hyperlipidemia Mother    Cancer Father    Hyperlipidemia Father    Mental  illness Sister    Diabetes Sister    Diabetes Maternal Grandmother    Diabetes Maternal Grandfather    Colon cancer Neg Hx    Esophageal cancer Neg Hx    Stomach cancer Neg Hx    Rectal cancer Neg Hx    Tremor Neg Hx    Parkinson's disease Neg Hx      Prior to Admission medications   Medication Sig Start Date End Date Taking? Authorizing Provider  acetaminophen (TYLENOL) 500 MG tablet Take 500 mg by  mouth every 6 (six) hours as needed for fever or moderate pain.   Yes [provider]  albuterol (VENTOLIN HFA) 108 (90 Base) MCG/ACT inhaler Inhale 2 puffs into the lungs every 6 (six) hours as needed for wheezing or shortness of breath. Patient taking differently: Inhale 2 puffs into the lungs every other day. 04/03/21  Yes Mercy Riding, MD  apixaban (ELIQUIS) 5 MG TABS tablet Take 1 tablet (5 mg total) by mouth 2 (two) times daily. Resume taking on 09/08/22 09/19/22  Yes Leonie Man, MD  atorvastatin (LIPITOR) 80 MG tablet Take 1 tablet (80 mg total) by mouth every evening. 09/19/22  Yes Leonie Man, MD  cetirizine (ZYRTEC) 10 MG tablet Take 10 mg by mouth daily as needed for allergies.   Yes [provider]  diltiazem (CARDIZEM CD) 360 MG 24 hr capsule Take 1 capsule (360 mg total) by mouth daily. 09/19/22  Yes Leonie Man, MD  folic acid (FOLVITE) 1 MG tablet Take 2 tablets (2 mg total) by mouth daily. Patient taking differently: Take 1 mg by mouth in the morning and at bedtime. 01/21/20  Yes Copland, Gay Filler, MD  furosemide (LASIX) 20 MG tablet Decrease Lasix ( furosemide)  to 20 mg ( 1/2/tablet)  , with 20 mg  you may take a an additional 20 mg if need for swelling or weight gain more than 3 lbs overnight or 5 lbs in one week -- if you have continue to take an additional 20 mg , go back to 40 mg  Take additional dose as needed for egt gain> 3 lb in 1 day Patient taking differently: Take 20 mg by mouth daily as needed for fluid. 11/15/21  Yes Shawna Clamp, MD   hydroxychloroquine (PLAQUENIL) 200 MG tablet Take 200 mg by mouth 2 (two) times daily. 05/30/22  Yes [provider]  insulin lispro (HUMALOG) 100 UNIT/ML injection Max Daily 100 units per pump Patient taking differently: Max Daily 100 units per pump per patient 04/12/22  Yes [name], Melanie Crazier, MD  isosorbide mononitrate (IMDUR) 30 MG 24 hr tablet Take 1 tablet (30 mg total) by mouth daily. 09/19/22  Yes Leonie Man, MD  ketoconazole (NIZORAL) 2 % cream Apply 1 Application topically daily. 11/12/22  Yes Lorenda Peck, DPM  leflunomide (ARAVA) 20 MG tablet Take 20 mg by mouth daily. 08/16/21  Yes [provider]  losartan (COZAAR) 100 MG tablet Take 0.5 tablets (50 mg total) by mouth daily. 09/19/22  Yes Leonie Man, MD  Melatonin 5 MG CHEW Chew 5 mg by mouth at bedtime.   Yes [provider]  Multiple Vitamin (MULTIVITAMIN WITH MINERALS) TABS tablet Take 1 tablet by mouth daily.   Yes [provider]  Oxymetazoline HCl (VICKS SINEX NA) Place 1 spray into the nose daily as needed (congestion).   Yes [provider]  pantoprazole (PROTONIX) 40 MG tablet Take 1 tablet (40 mg total) by mouth 2 (two) times daily before a meal. 09/06/22 12/15/22 Yes Arrien, Jimmy Picket, MD  tirzepatide [facility]) 7.5 MG/0.5ML Pen Inject 7.5 mg into the skin once a week. 10/12/22  Yes [name], Melanie Crazier, MD  traZODone (DESYREL) 50 MG tablet Take 0.5-1 tablets (25-50 mg total) by mouth at bedtime as needed. for sleep Patient taking differently: Take 25-50 mg by mouth at bedtime as needed for sleep. 08/01/22  Yes Copland, Gay Filler, MD  Continuous Blood Gluc Sensor (DEXCOM G6 SENSOR) MISC 1 Device by Does not apply  route as directed. 10/19/22   [name], Melanie Crazier, MD  Continuous Blood Gluc Transmit (DEXCOM G6 TRANSMITTER) MISC 1 Device by Does not apply route as directed. 10/19/22   [name], Melanie Crazier, MD  glucose blood (CONTOUR NEXT TEST)  test strip 3x daily 07/20/19   [name], Melanie Crazier, MD  Insulin Disposable Pump (OMNIPOD 5 G6 INTRO, GEN 5,) KIT 1 Device by Does not apply route every other day. 04/13/22   [name], Melanie Crazier, MD  Insulin Disposable Pump (OMNIPOD 5 G6 POD, GEN 5,) MISC 1 Device by Does not apply route every other day. 08/13/22   [name], Melanie Crazier, MD  Insulin Pen Needle (B-D UF III MINI PEN NEEDLES) 31G X 5 MM MISC USE DAILY WITH VICTOZA AND LANTUS. Patient taking differently: Use for ozempic 07/20/19   [name], Melanie Crazier, MD  Insulin Pen Needle 31G X 5 MM MISC 1 Device by Does not apply route 3 (three) times daily. 01/21/20   Copland, Gay Filler, MD  Lancets 30G MISC 1 Device by Does not apply route 3 (three) times daily. 07/16/19   Copland, Gay Filler, MD  predniSONE (DELTASONE) 10 MG tablet 4 tabs for 2 days, then 3 tabs for 2 days, 2 tabs for 2 days, then 1 tab for 2 days, then stop 12/14/22   Cobb, Karie Schwalbe, NP  SAFETY-LOK TB SYRINGE 27GX.5" 27G X 1/2" 1 ML MISC  08/18/19   [provider]  sucralfate (CARAFATE) 1 g tablet Take 1 tablet (1 g total) by mouth 2 (two) times daily for 14 days. Patient not taking: Reported on 12/15/2022 09/06/22 09/20/22  Arrien, Jimmy Picket, MD    Physical Exam: BP (!) 141/72   Pulse 81   Temp 98.5 F (36.9 C) (Oral)   Resp 18   LMP  (LMP Unknown)   SpO2 100%   General:  Alert, oriented, calm, in no acute distress, resting comfortably in bed.  Husband at the bedside.  Husband endorses that her face looks slightly more pale than usual. Eyes: EOMI, clear conjuctivae, white sclerea Neck: supple, no masses, trachea mildline  Cardiovascular: RRR, no murmurs or rubs, no peripheral edema  Respiratory: clear to auscultation bilaterally, no wheezes, no crackles  Abdomen: soft, nontender, nondistended, normal bowel tones heard  Skin: dry, no rashes  Musculoskeletal: no joint effusions, normal range of motion  Psychiatric: appropriate  affect, normal speech  Neurologic: extraocular muscles intact, clear speech, moving all extremities with intact sensorium          Labs on Admission:  Basic Metabolic Panel: Recent Labs  Lab 12/14/22 1223 12/15/22 0806  NA 142 137  K 4.0 3.1*  CL 103 101  CO2 30 26  GLUCOSE 164* 152*  BUN 15 17  CREATININE 1.15 1.35*  CALCIUM 9.5 8.4*   Liver Function Tests: Recent Labs  Lab 12/15/22 0806  AST 19  ALT 16  ALKPHOS 73  BILITOT 0.6  PROT 7.3  ALBUMIN 3.6   No results for input(s): "LIPASE", "AMYLASE" in the last 168 hours. No results for input(s): "AMMONIA" in the last 168 hours. CBC: Recent Labs  Lab 12/14/22 1223 12/15/22 0806  WBC 9.1 8.5  NEUTROABS 6.4  --   HGB 6.2 Repeated and verified X2.* 5.7*  HCT 20.3 Repeated and verified X2.* 20.1*  MCV 63.8 Repeated and verified X2.* 67.0*  PLT 283.0 285   Cardiac Enzymes: No results for input(s): "CKTOTAL", "CKMB", "CKMBINDEX", "TROPONINI" in the last 168 hours.  BNP (last 3 results) Recent  Labs    09/04/22 2224  BNP 103.4*    ProBNP (last 3 results) Recent Labs    12/14/22 1223  PROBNP 164.0*    CBG: No results for input(s): "GLUCAP" in the last 168 hours.  Radiological Exams on Admission: No results found.  Assessment/Plan Principal Problem:   ABLA (acute blood loss anemia) -patient had a similar presentation in 08/2022, she at the time she had 2 weeks of melena, hemoglobin noted to be 7.4.  She was given 2 units of blood, 11/22 she underwent EGD with 2 nonbleeding angiodysplastic lesions in the stomach.  They were treated with argon plasma coagulation.  She also had a single nonbleeding angiodysplastic lesion in the proximal jejunum.  Note that she does also have a history of thalassemia. -Observation admission -Avoid blood thinners -Hold Eliquis, and antihypertensives -Orders have been placed for 3 unit blood transfusion, will recheck hemoglobin in the evening -ER provider has paged  gastroenterology for their evaluation and management Active Problems:   Essential hypertension -hold losartan and Imdur AKI -presumably due to volume depletion from bleeding   Chronic diastolic CHF (congestive heart failure) ([place]) -currently no signs or symptoms of acute exacerbation, she remains on her baseline supplemental oxygen   Type 2 diabetes mellitus with hyperlipidemia (HCC)   Hyperlipidemia associated with type 2 diabetes mellitus (HCC)   PAF (paroxysmal atrial fibrillation) (HCC); CHA2DS2-VASc score 7   Class 3 obesity (HCC)   ILD (interstitial lung disease) (HCC)   OSA on CPAP   DVT prophylaxis: SCDs  Code Status: Full  Family Communication: Plan of care discussed in detail with the patient and her husband at the bedside this morning at the time of admission.  All questions were answered to their satisfaction.  Disposition Plan: Obs  Consults called: ER MD called GI  Admission status: Obs  Time spent: 35 minutes  Thena Devora Neva Seat MD [facility] Pager [phone]  If 7PM-7AM, please contact night-coverage [email or web address] Password [facility]  12/15/2022, 9:39 AM

## 2022-12-15 NOTE — Progress Notes (Signed)
Patient has insulin pump and Dexcom reader on left arm.  Patient removed both and placed them with her bedside belongings in her personal bag.

## 2022-12-15 NOTE — ED Provider Notes (Signed)
I provided a substantive portion of the care of this patient.  I personally made/approved the management plan for this patient and take responsibility for the patient management.     Patient has history of blood loss anemia.  She is anticoagulated on Xarelto.  She has gotten increasingly weak and short of breath with activity.  Patient endorses chest pain with activity but not at rest.  Patient is very pale in appearance.  Alert with clear mental status.  Mild increased work of breathing at rest.  Heart is regular no rub murmur gallop lungs are clear to auscultation.  No peripheral edema calves soft nontender.  Patient has critical anemia that is symptomatic.  She will require blood transfusion and admission.  Agree with plan of management.   Charlesetta Shanks, MD 12/15/22 [phone]

## 2022-12-16 ENCOUNTER — Observation Stay (HOSPITAL_COMMUNITY): Payer: Medicare Other

## 2022-12-16 DIAGNOSIS — K5730 Diverticulosis of large intestine without perforation or abscess without bleeding: Secondary | ICD-10-CM | POA: Diagnosis not present

## 2022-12-16 DIAGNOSIS — D509 Iron deficiency anemia, unspecified: Secondary | ICD-10-CM | POA: Diagnosis not present

## 2022-12-16 DIAGNOSIS — D62 Acute posthemorrhagic anemia: Secondary | ICD-10-CM | POA: Diagnosis not present

## 2022-12-16 DIAGNOSIS — R0602 Shortness of breath: Secondary | ICD-10-CM | POA: Diagnosis not present

## 2022-12-16 LAB — BASIC METABOLIC PANEL
Anion gap: 10 (ref 5–15)
BUN: 15 mg/dL (ref 8–23)
CO2: 25 mmol/L (ref 22–32)
Calcium: 8.7 mg/dL — ABNORMAL LOW (ref 8.9–10.3)
Chloride: 104 mmol/L (ref 98–111)
Creatinine, Ser: 0.89 mg/dL (ref 0.44–1.00)
GFR, Estimated: >60 mL/min (ref >60)
Glucose, Bld: 172 mg/dL — ABNORMAL HIGH (ref 70–99)
Potassium: 3.8 mmol/L (ref 3.5–5.1)
Sodium: 139 mmol/L (ref 135–145)

## 2022-12-16 LAB — GLUCOSE, CAPILLARY
Glucose-Capillary: 199 mg/dL — ABNORMAL HIGH (ref 70–99)
Glucose-Capillary: 160 mg/dL — ABNORMAL HIGH (ref 70–99)
Glucose-Capillary: 149 mg/dL — ABNORMAL HIGH (ref 70–99)
Glucose-Capillary: 159 mg/dL — ABNORMAL HIGH (ref 70–99)
Glucose-Capillary: 223 mg/dL — ABNORMAL HIGH (ref 70–99)

## 2022-12-16 LAB — HEMOGLOBIN AND HEMATOCRIT, BLOOD
HCT: 32.9 % — ABNORMAL LOW (ref 36.0–46.0)
Hemoglobin: 10.2 g/dL — ABNORMAL LOW (ref 12.0–15.0)

## 2022-12-16 LAB — CBC
HCT: 31.1 % — ABNORMAL LOW (ref 36.0–46.0)
Hemoglobin: 9.6 g/dL — ABNORMAL LOW (ref 12.0–15.0)
MCH: 23.1 pg — ABNORMAL LOW (ref 26.0–34.0)
MCHC: 30.9 g/dL (ref 30.0–36.0)
MCV: 74.9 fL — ABNORMAL LOW (ref 80.0–100.0)
Platelets: 233 10*3/uL (ref 150–400)
RBC: 4.15 MIL/uL (ref 3.87–5.11)
RDW: 24.8 % — ABNORMAL HIGH (ref 11.5–15.5)
WBC: 10.5 10*3/uL (ref 4.0–10.5)
nRBC: 0.4 % — ABNORMAL HIGH (ref 0.0–0.2)

## 2022-12-16 MED ORDER — FOLIC ACID 1 MG PO TABS
2.0000 mg | ORAL_TABLET | Freq: Every day | ORAL | Status: DC
  Administered 2022-12-16 – 2022-12-18 (×3): 2 mg via ORAL
  Filled 2022-12-16 (×3): qty 2

## 2022-12-16 MED ORDER — FUROSEMIDE 20 MG PO TABS
20.0000 mg | ORAL_TABLET | Freq: Every day | ORAL | Status: DC
  Administered 2022-12-16: 20 mg via ORAL
  Filled 2022-12-16: qty 1

## 2022-12-16 MED ORDER — DILTIAZEM HCL ER COATED BEADS 180 MG PO CP24
180.0000 mg | ORAL_CAPSULE | Freq: Every day | ORAL | Status: DC
  Administered 2022-12-16 – 2022-12-18 (×3): 180 mg via ORAL
  Filled 2022-12-16 (×3): qty 1

## 2022-12-16 MED ORDER — TIRZEPATIDE 7.5 MG/0.5ML ~~LOC~~ SOAJ
7.5000 mg | SUBCUTANEOUS | Status: DC
  Administered 2022-12-16: 7.5 mg via SUBCUTANEOUS

## 2022-12-16 MED ORDER — ORAL CARE MOUTH RINSE: 15.0000 mL | OROMUCOSAL | Status: DC | PRN

## 2022-12-16 MED ORDER — ALBUTEROL SULFATE HFA 108 (90 BASE) MCG/ACT IN AERS: 2.0000 | INHALATION_SPRAY | Freq: Four times a day (QID) | RESPIRATORY_TRACT | Status: DC | PRN

## 2022-12-16 MED ORDER — MUSCLE RUB 10-15 % EX CREA
TOPICAL_CREAM | CUTANEOUS | Status: DC | PRN
  Filled 2022-12-16: qty 85

## 2022-12-16 MED ORDER — ALBUTEROL SULFATE (2.5 MG/3ML) 0.083% IN NEBU: 2.5000 mg | INHALATION_SOLUTION | Freq: Four times a day (QID) | RESPIRATORY_TRACT | Status: DC | PRN

## 2022-12-16 NOTE — Progress Notes (Addendum)
[facility] Subjective: S patient is a 67 year old black female with multiple medical issues including IPF, CAD, hypertension, hyperlipidemia, diastolic dysfunction, paroxysmal atrial fibrillation, colonic diverticulosis and a history of gastric ulcers. Since I last evaluated the patient.she has been stable. She has not had a BM today. She denies having any abdominal pain or nausea. Last dose of Eliquis was on 12/14/22. She had an fluoroscopy done by Dr. Rush Landmark October last event to gastric AVMs were ablated.  Her last colonoscopy was done in 2017 by Dr. Fuller Plan when she was noted to have diverticulosis in the entire colon and a small benign polyp was removed.  Her hemoglobin is improved from 6.2 g/dL on admission to 9.6 g/dL today after blood transfusion     Objective: Vital signs in last 24 hours: Temp:  [97 F (36.1 C)-99 F (37.2 C)] 97 F (36.1 C) (03/03 0500) Pulse Rate:  [69-87] 78 (03/03 0500) Resp:  [11-29] 19 (03/03 0500) BP: (111-176)/(49-87) 139/73 (03/03 0500) SpO2:  [95 %-100 %] 96 % (03/03 0500) Weight:  [108.5 kg] 108.5 kg (03/02 1235) Last BM Date : 12/15/22  Intake/Output from previous day: 03/02 0701 - 03/03 0700 In: 1400 [P.O.:240; Blood:1160] Out: 1400 [Urine:1400] Intake/Output this shift: Total I/O In: -  Out: 350 [Urine:350]  General appearance: alert, cooperative, appears stated age, and morbidly obese Resp: clear to auscultation bilaterally Cardio: regular rate and rhythm, S1, S2 normal, no murmur, click, rub or gallop GI: soft, non-tender; bowel sounds normal; no masses,  no organomegaly  Lab Results: Recent Labs    12/14/22 1223 12/15/22 0806 12/15/22 2250 12/16/22 0452  WBC 9.1 8.5  --  10.5  HGB 6.2 Repeated and verified X2.* 5.7* 9.6* 9.6*  HCT 20.3 Repeated and verified X2.* 20.1* 31.4* 31.1*  PLT 283.0 285  --  233   BMET Recent Labs    12/14/22 1223 12/15/22 0806 12/16/22 0452  NA 142 137 139  K 4.0 3.1* 3.8  CL 103 101  104  CO2 '30 26 25  '$ GLUCOSE 164* 152* 172*  BUN '15 17 15  '$ CREATININE 1.15 1.35* 0.89  CALCIUM 9.5 8.4* 8.7*   LFT Recent Labs    12/15/22 0806  PROT 7.3  ALBUMIN 3.6  AST 19  ALT 16  ALKPHOS 73  BILITOT 0.6   PT/INR Recent Labs    12/15/22 0823  LABPROT 15.2  INR 1.2   Medications: I have reviewed the patient's current medications. Prior to Admission:  Medications Prior to Admission  Medication Sig Dispense Refill Last Dose   acetaminophen (TYLENOL) 500 MG tablet Take 500 mg by mouth every 6 (six) hours as needed for fever or moderate pain.   unk   albuterol (VENTOLIN HFA) 108 (90 Base) MCG/ACT inhaler Inhale 2 puffs into the lungs every 6 (six) hours as needed for wheezing or shortness of breath. (Patient taking differently: Inhale 2 puffs into the lungs every other day.) 8 g 2 unk   apixaban (ELIQUIS) 5 MG TABS tablet Take 1 tablet (5 mg total) by mouth 2 (two) times daily. Resume taking on 09/08/22 180 tablet 3 12/14/2022 at 0800   atorvastatin (LIPITOR) 80 MG tablet Take 1 tablet (80 mg total) by mouth every evening. 90 tablet 3 12/15/2022   cetirizine (ZYRTEC) 10 MG tablet Take 10 mg by mouth daily as needed for allergies.   unk   diltiazem (CARDIZEM CD) 360 MG 24 hr capsule Take 1 capsule (360 mg total) by mouth daily. 90 capsule  3 [id number]   folic acid (FOLVITE) 1 MG tablet Take 2 tablets (2 mg total) by mouth daily. (Patient taking differently: Take 1 mg by mouth in the morning and at bedtime.) 180 tablet 3 12/15/2022   furosemide (LASIX) 20 MG tablet Decrease Lasix ( furosemide)  to 20 mg ( 1/2/tablet)  , with 20 mg  you may take a an additional 20 mg if need for swelling or weight gain more than 3 lbs overnight or 5 lbs in one week -- if you have continue to take an additional 20 mg , go back to 40 mg  Take additional dose as needed for egt gain> 3 lb in 1 day (Patient taking differently: Take 20 mg by mouth daily as needed for fluid.)   unk   hydroxychloroquine (PLAQUENIL)  200 MG tablet Take 200 mg by mouth 2 (two) times daily.   12/15/2022   insulin lispro (HUMALOG) 100 UNIT/ML injection Max Daily 100 units per pump (Patient taking differently: Max Daily 100 units per pump per patient) 90 mL 3 12/15/2022   isosorbide mononitrate (IMDUR) 30 MG 24 hr tablet Take 1 tablet (30 mg total) by mouth daily. 90 tablet 3 12/15/2022   ketoconazole (NIZORAL) 2 % cream Apply 1 Application topically daily. 60 g 2 unk   leflunomide (ARAVA) 20 MG tablet Take 20 mg by mouth daily.   12/15/2022   losartan (COZAAR) 100 MG tablet Take 0.5 tablets (50 mg total) by mouth daily. 90 tablet 3 12/15/2022   Melatonin 5 MG CHEW Chew 5 mg by mouth at bedtime.   Past Week   Multiple Vitamin (MULTIVITAMIN WITH MINERALS) TABS tablet Take 1 tablet by mouth daily.   12/15/2022   Oxymetazoline HCl (VICKS SINEX NA) Place 1 spray into the nose daily as needed (congestion).   unk   pantoprazole (PROTONIX) 40 MG tablet Take 1 tablet (40 mg total) by mouth 2 (two) times daily before a meal. 60 tablet 0 12/14/2022   tirzepatide (MOUNJARO) 7.5 MG/0.5ML Pen Inject 7.5 mg into the skin once a week. 6 mL 3 Past Week   traZODone (DESYREL) 50 MG tablet Take 0.5-1 tablets (25-50 mg total) by mouth at bedtime as needed. for sleep (Patient taking differently: Take 25-50 mg by mouth at bedtime as needed for sleep.) 90 tablet 2 unk   Continuous Blood Gluc Sensor (DEXCOM G6 SENSOR) MISC 1 Device by Does not apply route as directed. 3 each 6    Continuous Blood Gluc Transmit (DEXCOM G6 TRANSMITTER) MISC 1 Device by Does not apply route as directed. 1 each 3    glucose blood (CONTOUR NEXT TEST) test strip 3x daily 300 each 11    Insulin Disposable Pump (OMNIPOD 5 G6 INTRO, GEN 5,) KIT 1 Device by Does not apply route every other day. 1 kit 0    Insulin Disposable Pump (OMNIPOD 5 G6 POD, GEN 5,) MISC 1 Device by Does not apply route every other day. 30 each 3    Insulin Pen Needle (B-D UF III MINI PEN NEEDLES) 31G X 5 MM MISC USE DAILY  WITH VICTOZA AND LANTUS. (Patient taking differently: Use for ozempic) 400 each 1    Insulin Pen Needle 31G X 5 MM MISC 1 Device by Does not apply route 3 (three) times daily. 150 each 11    Lancets 30G MISC 1 Device by Does not apply route 3 (three) times daily. 300 each 9    predniSONE (DELTASONE) 10 MG tablet 4 tabs for 2  days, then 3 tabs for 2 days, 2 tabs for 2 days, then 1 tab for 2 days, then stop 20 tablet 0    SAFETY-LOK TB SYRINGE 27GX.5" 27G X 1/2" 1 ML MISC       sucralfate (CARAFATE) 1 g tablet Take 1 tablet (1 g total) by mouth 2 (two) times daily for 14 days. (Patient not taking: Reported on 12/15/2022) 28 tablet 0 Completed Course   Scheduled:  albuterol  2.5 mg Nebulization BID   diltiazem  180 mg Oral Daily   folic acid  2 mg Oral Daily   hydroxychloroquine  200 mg Oral BID   insulin aspart  0-15 Units Subcutaneous TID WC   insulin aspart  0-5 Units Subcutaneous QHS   pantoprazole (PROTONIX) IV  40 mg Intravenous Q24H   Continuous: [id number] **OR** acetaminophen, albuterol, albuterol, Muscle Rub, ondansetron **OR** ondansetron (ZOFRAN) IV, oxyCODONE, traZODone  Assessment/Plan: 1) Iron deficiency anemia/melena/history of gastric AVMs and gastric ulcers-EGD planned for tomorrow as the patient will be off the Eliquis for 3 days prior to the EGD by then.  Continue PPI's 2) Colonic diverticulosis. 3) Chronic diastolic CHF/HTN. 4) AODM. 5)  Hyperlipidemia. 6) Idiopathic pulmonary fibrosis/OSA. 7) Morbid obesity. 8) Rheumatoid arthritis.  LOS: 0 days   Juanita Craver 12/16/2022, 9:00 AM

## 2022-12-16 NOTE — Progress Notes (Signed)
Patient takes Mounjaro 7.5 mg/0.5 mL injection once a week. Per patient, she takes this medication each week on Sundays and is requesting to have her husband bring her home dose so she can administer this medication due to it is not available her at the hospital. MD Horris Latino advised okay for patient to take [facility].  Called pharmacy, spoke with Randa Spike.   Pharmacist advised he will assist in placing the order so medication can be documented by RN on [facility].  Pharmacist advised RN does not need to fill out home medication form and bring medication to the pharmacy due to injection is a pen and is only administered once a week. RN just needs to confirm medication prior to patient administering.

## 2022-12-16 NOTE — Progress Notes (Signed)
RT took pt off cpap due to not tolerating well. Patient placed back on [place] at 3L

## 2022-12-16 NOTE — Progress Notes (Signed)
PROGRESS NOTE  Lynn [name] [id number] DOB: [birth year] DOA: 12/15/2022 PCP: [name], MD  HPI/Recap of past 24 hours: Lynn [name] is a 67 y.o. female with medical history significant for CAD, anxiety, depression, chronic diastolic dysfunction, gastric ulcers, hypertension, hyperlipidemia admitted to the hospital for ongoing melena for the past couple of weeks. Patient states that she had a follow-up with her pulmonologist where lab work was done and was found to have a hemoglobin of 8.2 and was instructed to go to the ER. In the ED, hemoglobin was noted to be 5.7, creatinine is up to 1.35 from baseline of about 1.1, potassium 3.1. Hospitalist was contacted for admission, gastroenterology was consulted by ED provider.     Today, patient reported having a rough night, noted to be significantly short of breath likely 2/2 volume overload from receiving 3 units of PRBC as well as IV fluids history of diastolic HF.  Patient was given the breathing treatments.  Patient reports feeling better this morning, denies any further episodes of melena, denies any shortness of breath, chest pain, abdominal pain, nausea/vomiting, fever/chills.   Assessment/Plan: Principal Problem:   ABLA (acute blood loss anemia) Active Problems:   Essential hypertension   Chronic diastolic CHF (congestive heart failure) (HCC)   Type 2 diabetes mellitus with hyperlipidemia (HCC)   Hyperlipidemia associated with type 2 diabetes mellitus (HCC)   PAF (paroxysmal atrial fibrillation) (HCC); CHA2DS2-VASc score 7   Class 3 obesity (HCC)   ILD (interstitial lung disease) (HCC)   OSA on CPAP    Likely upper GI bleeding Acute blood loss anemia History of gastric AVMs, gastric ulcer, diverticulosis History of thalassemia Hemoglobin down from 8.2-->5.7 S/p 3 units of PRBC on 12/15/2022, hemoglobin now 9.6 GI on board, plan for EGD on 3/4 Transfuse if hemoglobin less than 7 Continue to hold  Eliquis Follow CBC closely  HTN Hold home losartan and imdur  Chronic diastolic HF Probnp [id number] ECHO 2/24 showed EF of 60-65%, grade 1 DD Restart home lasix at 20 mg daily given recent 3U of PRBC Hold home losartan, imdur  Diabetes mellitus type 2 Last A1c 7.6 on 03/2022 On insulin pump at home, with continuous glucose monitoring SSI, Accu-Cheks, hypoglycemic protocol  Paroxysmal A-fib Reduced dose of PTA diltiazem to 180 mg daily for now Holding Eliquis due to GI bleed  ILD Chronic hypoxic resp failure Patient on 3 L of O2 at baseline DuoNebs scheduled and as needed  GERD Continue PPI  Rheumatoid arthritis Continue Plaquenil  Morbid obesity Lifestyle modification advised  OSA CPAP    Estimated body mass index is 42.37 kg/m as calculated from the following:   Height as of this encounter: '5\' 3"'$  (1.6 m).   Weight as of this encounter: 108.5 kg.       Code Status: Full  Family Communication: None at bedside  Disposition Plan: Status is: Observation The patient will require care spanning > 2 midnights and should be moved to inpatient because: Level of care      Consultants: GI  Procedures: None  Antimicrobials: None  DVT prophylaxis: SCDs   Objective: Vitals:   12/15/22 2032 12/16/22 0050 12/16/22 0053 12/16/22 0500  BP: (!) 154/71  (!) 176/87 139/73  Pulse:   87 78  Resp: (!) '29 18 18 19  '$ Temp: 98.7 F (37.1 C)  (!) 97.5 F (36.4 C) (!) 97 F (36.1 C)  TempSrc: Oral  Oral Oral  SpO2: 96% 95% 100% 96%  Weight:  Height:        Intake/Output Summary (Last 24 hours) at 12/16/2022 1200 Last data filed at 12/16/2022 0950 Gross per 24 hour  Intake 1400 ml  Output 2100 ml  Net -700 ml   Filed Weights   12/15/22 1235  Weight: 108.5 kg    Exam: General: NAD  Cardiovascular: S1, S2 present Respiratory: Diminished breath sounds bilaterally Abdomen: Soft, nontender, nondistended, bowel sounds present Musculoskeletal: No bilateral  pedal edema noted Skin: Normal Psychiatry: Normal mood    Data Reviewed: CBC: Recent Labs  Lab 12/14/22 1223 12/15/22 0806 12/15/22 2250 12/16/22 0452  WBC 9.1 8.5  --  10.5  NEUTROABS 6.4  --   --   --   HGB 6.2 Repeated and verified X2.* 5.7* 9.6* 9.6*  HCT 20.3 Repeated and verified X2.* 20.1* 31.4* 31.1*  MCV 63.8 Repeated and verified X2.* 67.0*  --  74.9*  PLT 283.0 285  --  0000000   Basic Metabolic Panel: Recent Labs  Lab 12/14/22 1223 12/15/22 0806 12/16/22 0452  NA 142 137 139  K 4.0 3.1* 3.8  CL 103 101 104  CO2 '30 26 25  '$ GLUCOSE 164* 152* 172*  BUN '15 17 15  '$ CREATININE 1.15 1.35* 0.89  CALCIUM 9.5 8.4* 8.7*   GFR: Estimated Creatinine Clearance: 71.4 mL/min (by C-G formula based on SCr of 0.89 mg/dL). Liver Function Tests: Recent Labs  Lab 12/15/22 0806  AST 19  ALT 16  ALKPHOS 73  BILITOT 0.6  PROT 7.3  ALBUMIN 3.6   No results for input(s): "LIPASE", "AMYLASE" in the last 168 hours. No results for input(s): "AMMONIA" in the last 168 hours. Coagulation Profile: Recent Labs  Lab 12/15/22 0823  INR 1.2   Cardiac Enzymes: No results for input(s): "CKTOTAL", "CKMB", "CKMBINDEX", "TROPONINI" in the last 168 hours. BNP (last 3 results) Recent Labs    12/14/22 1223  PROBNP 164.0*   HbA1C: No results for input(s): "HGBA1C" in the last 72 hours. CBG: Recent Labs  Lab 12/15/22 1641 12/15/22 2034 12/16/22 0113 12/16/22 0757 12/16/22 1139  GLUCAP 197* 155* 199* 160* 149*   Lipid Profile: No results for input(s): "CHOL", "HDL", "LDLCALC", "TRIG", "CHOLHDL", "LDLDIRECT" in the last 72 hours. Thyroid Function Tests: No results for input(s): "TSH", "T4TOTAL", "FREET4", "T3FREE", "THYROIDAB" in the last 72 hours. Anemia Panel: No results for input(s): "VITAMINB12", "FOLATE", "FERRITIN", "TIBC", "IRON", "RETICCTPCT" in the last 72 hours. Urine analysis:    Component Value Date/Time   COLORURINE YELLOW 09/05/2022 0237   APPEARANCEUR CLEAR  09/05/2022 0237   LABSPEC 1.013 09/05/2022 0237   PHURINE 5.0 09/05/2022 0237   GLUCOSEU NEGATIVE 09/05/2022 0237   HGBUR NEGATIVE 09/05/2022 0237   BILIRUBINUR NEGATIVE 09/05/2022 0237   KETONESUR NEGATIVE 09/05/2022 0237   PROTEINUR NEGATIVE 09/05/2022 0237   UROBILINOGEN 0.2 08/18/2008 1535   NITRITE NEGATIVE 09/05/2022 0237   LEUKOCYTESUR TRACE (A) 09/05/2022 0237   Sepsis Labs: '@LABRCNTIP'$ (procalcitonin:4,lacticidven:4)  )No results found for this or any previous visit (from the past 240 hour(s)).    Studies: No results found.  Scheduled Meds:  albuterol  2.5 mg Nebulization BID   diltiazem  180 mg Oral Daily   folic acid  2 mg Oral Daily   hydroxychloroquine  200 mg Oral BID   insulin aspart  0-15 Units Subcutaneous TID WC   insulin aspart  0-5 Units Subcutaneous QHS   pantoprazole (PROTONIX) IV  40 mg Intravenous Q24H    Continuous Infusions:   LOS: 0 days  Alma Friendly, MD [facility]  If 7PM-7AM, please contact night-coverage [email or web address] 12/16/2022, 12:00 PM

## 2022-12-16 NOTE — Progress Notes (Signed)
Mobility Specialist - Progress Note  Pre-mobility: 93% SpO2 During mobility: 105 bpm HR, 91% SpO2 Post-mobility: 100 bpm HR, 91% SPO2   12/16/22 1419  Oxygen Therapy  O2 Device Nasal Cannula  O2 Flow Rate (L/min) 3 L/min  Patient Activity (if Appropriate) Ambulating  Mobility  Activity Ambulated with assistance in hallway  Level of Assistance Standby assist, set-up cues, supervision of patient - no hands on  Assistive Device Other (Comment) (hallway rails)  Distance Ambulated (ft) 400 ft  Range of Motion/Exercises Active  Activity Response Tolerated fair  Mobility Referral Yes  $Mobility charge 1 Mobility   Pt was found in bed and agreeable to ambulate. Had x2 standing rest breaks due to feeling SOB. SPO2 was maintained >90% throughout session. At EOS returned to bed with NT in room.  Ferd Hibbs Mobility Specialist

## 2022-12-17 ENCOUNTER — Observation Stay (HOSPITAL_COMMUNITY): Payer: Medicare Other | Admitting: Anesthesiology

## 2022-12-17 ENCOUNTER — Encounter (HOSPITAL_COMMUNITY)
Admission: EM | Disposition: A | Discharge status: Home / Self Care | Payer: Self-pay | Source: Home / Self Care | Attending: Emergency Medicine

## 2022-12-17 ENCOUNTER — Observation Stay (HOSPITAL_BASED_OUTPATIENT_CLINIC_OR_DEPARTMENT_OTHER): Payer: Medicare Other | Admitting: Anesthesiology

## 2022-12-17 ENCOUNTER — Encounter (HOSPITAL_COMMUNITY): Payer: Self-pay | Admitting: Internal Medicine

## 2022-12-17 ENCOUNTER — Telehealth: Payer: Self-pay

## 2022-12-17 DIAGNOSIS — Z87891 Personal history of nicotine dependence: Secondary | ICD-10-CM | POA: Diagnosis not present

## 2022-12-17 DIAGNOSIS — I110 Hypertensive heart disease with heart failure: Secondary | ICD-10-CM

## 2022-12-17 DIAGNOSIS — D62 Acute posthemorrhagic anemia: Secondary | ICD-10-CM | POA: Diagnosis not present

## 2022-12-17 DIAGNOSIS — I5032 Chronic diastolic (congestive) heart failure: Secondary | ICD-10-CM | POA: Diagnosis not present

## 2022-12-17 DIAGNOSIS — I2510 Atherosclerotic heart disease of native coronary artery without angina pectoris: Secondary | ICD-10-CM | POA: Diagnosis not present

## 2022-12-17 DIAGNOSIS — K259 Gastric ulcer, unspecified as acute or chronic, without hemorrhage or perforation: Secondary | ICD-10-CM | POA: Diagnosis not present

## 2022-12-17 DIAGNOSIS — D649 Anemia, unspecified: Secondary | ICD-10-CM | POA: Diagnosis not present

## 2022-12-17 DIAGNOSIS — K253 Acute gastric ulcer without hemorrhage or perforation: Secondary | ICD-10-CM | POA: Diagnosis not present

## 2022-12-17 DIAGNOSIS — I509 Heart failure, unspecified: Secondary | ICD-10-CM | POA: Diagnosis not present

## 2022-12-17 HISTORY — PX: ESOPHAGOGASTRODUODENOSCOPY: SHX5428

## 2022-12-17 HISTORY — PX: BIOPSY: SHX5522

## 2022-12-17 LAB — TYPE AND SCREEN
ABO/RH(D): O POS
Antibody Screen: NEGATIVE
Unit division: 0
Unit division: 0
Unit division: 0

## 2022-12-17 LAB — CBC WITH DIFFERENTIAL/PLATELET
Abs Immature Granulocytes: 0.04 10*3/uL (ref 0.00–0.07)
Basophils Absolute: 0.1 10*3/uL (ref 0.0–0.1)
Basophils Relative: 1 %
Eosinophils Absolute: 0.3 10*3/uL (ref 0.0–0.5)
Eosinophils Relative: 3 %
HCT: 31.6 % — ABNORMAL LOW (ref 36.0–46.0)
Hemoglobin: 9.4 g/dL — ABNORMAL LOW (ref 12.0–15.0)
Immature Granulocytes: 1 %
Lymphocytes Relative: 16 %
Lymphs Abs: 1.4 10*3/uL (ref 0.7–4.0)
MCH: 23.4 pg — ABNORMAL LOW (ref 26.0–34.0)
MCHC: 29.7 g/dL — ABNORMAL LOW (ref 30.0–36.0)
MCV: 78.8 fL — ABNORMAL LOW (ref 80.0–100.0)
Monocytes Absolute: 0.9 10*3/uL (ref 0.1–1.0)
Monocytes Relative: 11 %
Neutro Abs: 6.1 10*3/uL (ref 1.7–7.7)
Neutrophils Relative %: 68 %
Platelets: 243 10*3/uL (ref 150–400)
RBC: 4.01 MIL/uL (ref 3.87–5.11)
RDW: 26.2 % — ABNORMAL HIGH (ref 11.5–15.5)
WBC: 8.7 10*3/uL (ref 4.0–10.5)
nRBC: 0.5 % — ABNORMAL HIGH (ref 0.0–0.2)

## 2022-12-17 LAB — BPAM RBC
Blood Product Expiration Date: 202403042359
Blood Product Expiration Date: 202403292359
Blood Product Expiration Date: 202403312359
ISSUE DATE / TIME: 202403021308
ISSUE DATE / TIME: 202403021525
ISSUE DATE / TIME: 202403021826
Unit Type and Rh: 5100
Unit Type and Rh: 5100
Unit Type and Rh: 9500

## 2022-12-17 LAB — BASIC METABOLIC PANEL
Anion gap: 12 (ref 5–15)
BUN: 9 mg/dL (ref 8–23)
CO2: 23 mmol/L (ref 22–32)
Calcium: 9 mg/dL (ref 8.9–10.3)
Chloride: 104 mmol/L (ref 98–111)
Creatinine, Ser: 1.01 mg/dL — ABNORMAL HIGH (ref 0.44–1.00)
GFR, Estimated: >60 mL/min (ref >60)
Glucose, Bld: 129 mg/dL — ABNORMAL HIGH (ref 70–99)
Potassium: 3.7 mmol/L (ref 3.5–5.1)
Sodium: 139 mmol/L (ref 135–145)

## 2022-12-17 LAB — GLUCOSE, CAPILLARY
Glucose-Capillary: 136 mg/dL — ABNORMAL HIGH (ref 70–99)
Glucose-Capillary: 145 mg/dL — ABNORMAL HIGH (ref 70–99)
Glucose-Capillary: 134 mg/dL — ABNORMAL HIGH (ref 70–99)
Glucose-Capillary: 177 mg/dL — ABNORMAL HIGH (ref 70–99)
Glucose-Capillary: 240 mg/dL — ABNORMAL HIGH (ref 70–99)

## 2022-12-17 LAB — HEMOGLOBIN A1C
Hgb A1c MFr Bld: 6.5 % — ABNORMAL HIGH (ref 4.8–5.6)
Mean Plasma Glucose: 140 mg/dL

## 2022-12-17 SURGERY — EGD (ESOPHAGOGASTRODUODENOSCOPY)
Anesthesia: Monitor Anesthesia Care

## 2022-12-17 MED ORDER — PROPOFOL 500 MG/50ML IV EMUL
INTRAVENOUS | Status: DC | PRN
  Administered 2022-12-17: 110 ug/kg/min via INTRAVENOUS

## 2022-12-17 MED ORDER — LACTATED RINGERS IV SOLN: INTRAVENOUS | Status: DC | PRN

## 2022-12-17 MED ORDER — LIDOCAINE HCL 1 % IJ SOLN
INTRAMUSCULAR | Status: DC | PRN
  Administered 2022-12-17: 50 mg via INTRADERMAL

## 2022-12-17 MED ORDER — SODIUM CHLORIDE 0.9 % IV SOLN: INTRAVENOUS | Status: DC

## 2022-12-17 MED ORDER — PANTOPRAZOLE SODIUM 40 MG PO TBEC
40.0000 mg | DELAYED_RELEASE_TABLET | Freq: Two times a day (BID) | ORAL | Status: DC
  Administered 2022-12-17 – 2022-12-18 (×2): 40 mg via ORAL
  Filled 2022-12-17 (×2): qty 1

## 2022-12-17 MED ORDER — PROPOFOL 500 MG/50ML IV EMUL
INTRAVENOUS | Status: AC
  Filled 2022-12-17: qty 50

## 2022-12-17 MED ORDER — FUROSEMIDE 10 MG/ML IJ SOLN
40.0000 mg | Freq: Every day | INTRAMUSCULAR | Status: DC
  Administered 2022-12-17 – 2022-12-18 (×2): 40 mg via INTRAVENOUS
  Filled 2022-12-17 (×2): qty 4

## 2022-12-17 MED ORDER — PROPOFOL 10 MG/ML IV BOLUS
INTRAVENOUS | Status: DC | PRN
  Administered 2022-12-17: 10 mg via INTRAVENOUS

## 2022-12-17 NOTE — Progress Notes (Signed)
Mobility Specialist - Progress Note  Pre-mobility: 90% SpO2 (RA) During mobility: 105 bpm HR, 92% SpO2 (3L) Post-mobility: 93 bpm HR, 98% SPO2 (3L)   12/17/22 0952  Oxygen Therapy  O2 Device Nasal Cannula  O2 Flow Rate (L/min) 3 L/min  Patient Activity (if Appropriate) Ambulating  Mobility  Activity Ambulated independently in hallway  Level of Assistance Modified independent, requires aide device or extra time  Assistive Device Other (Comment) (hallway rails)  Distance Ambulated (ft) 400 ft  Range of Motion/Exercises Active  Activity Response Tolerated well  $Mobility charge 1 Mobility   Pt was found sitting EOB and agreeable to ambulate. Pt was found on RA and asking if she could ambulate on RA. SPO2 checked to be 90% on RA and advised pt to ambulate with O2. During ambulation c/o back pain and stated not feeling as winded as yesterday. Had x1 brief standing rest break due to the back pain and at EOS returned to sit EOB with all necessities in reach.  Ferd Hibbs Mobility Specialist

## 2022-12-17 NOTE — Progress Notes (Signed)
PROGRESS NOTE  Lynn [name] [id number] DOB: 12-04-1955 DOA: 12/15/2022 PCP: [name], MD  HPI/Recap of past 24 hours: Lynn [name] is a 67 y.o. female with medical history significant for CAD, anxiety, depression, chronic diastolic dysfunction, gastric ulcers, hypertension, hyperlipidemia admitted to the hospital for ongoing melena for the past couple of weeks. Patient states that she had a follow-up with her pulmonologist where lab work was done and was found to have a hemoglobin of 8.2 and was instructed to go to the ER. In the ED, hemoglobin was noted to be 5.7, creatinine is up to 1.35 from baseline of about 1.1, potassium 3.1. Hospitalist was contacted for admission, gastroenterology was consulted by ED provider.     Today, patient reported some chills, denies any other new complaints.  Denies any cough/congestion, abdominal pain, nausea/vomiting, bloody stool.   Assessment/Plan: Principal Problem:   ABLA (acute blood loss anemia) Active Problems:   Essential hypertension   Chronic diastolic CHF (congestive heart failure) (HCC)   Type 2 diabetes mellitus with hyperlipidemia (HCC)   Hyperlipidemia associated with type 2 diabetes mellitus (HCC)   PAF (paroxysmal atrial fibrillation) (HCC); CHA2DS2-VASc score 7   Class 3 obesity (HCC)   ILD (interstitial lung disease) (HCC)   OSA on CPAP    Likely upper GI bleeding Acute blood loss anemia History of gastric AVMs, gastric ulcer, diverticulosis History of thalassemia Hemoglobin down from 8.2-->5.7 S/p 3 units of PRBC on 12/15/2022, hemoglobin now stable GI on board, s/p EGD on 3/4--> noted nonbleeding gastric ulcer with no stigmata of bleeding, biopsied. Rec PPI BID Transfuse if hemoglobin less than 7 Continue to hold Eliquis Follow CBC closely  HTN Hold home losartan and imdur  Chronic diastolic HF Probnp [id number] ECHO 2/24 showed EF of 60-65%, grade 1 DD CXR with noted vascular congestion Vs  ??pneumonia Start IV lasix at 20 mg, given recent 3U of PRBC Given no fever/chills/WBC/cough, will hold off antibiotics and repeat CXR on 3/5 Hold home losartan, imdur  Diabetes mellitus type 2 Last A1c 7.6 on 03/2022 On insulin pump at home, with continuous glucose monitoring SSI, Accu-Cheks, hypoglycemic protocol  Paroxysmal A-fib Reduced dose of PTA diltiazem to 180 mg daily for now Holding Eliquis due to GI bleed  ILD Chronic hypoxic resp failure Patient on 3 L of O2 at baseline DuoNebs scheduled and as needed  GERD Continue PPI  Rheumatoid arthritis Continue Plaquenil  Morbid obesity Lifestyle modification advised  OSA CPAP    Estimated body mass index is 42.37 kg/m as calculated from the following:   Height as of this encounter: '5\' 3"'$  (1.6 m).   Weight as of this encounter: 108.5 kg.       Code Status: Full  Family Communication: None at bedside  Disposition Plan: Status is: Observation The patient will require care spanning > 2 midnights and should be moved to inpatient because: Level of care      Consultants: GI  Procedures: None  Antimicrobials: None  DVT prophylaxis: SCDs   Objective: Vitals:   12/17/22 1332 12/17/22 1341 12/17/22 1349 12/17/22 1414  BP: 109/67 (!) 147/70 (!) 141/86 (!) 156/95  Pulse: 83 80 77 80  Resp: (!) '23 17 15 16  '$ Temp: (!) 97.4 F (36.3 C)   98.1 F (36.7 C)  TempSrc: Temporal   Oral  SpO2: 100% 98% 100% 99%  Weight:      Height:        Intake/Output Summary (Last 24 hours) at 12/17/2022  [place] filed at 12/17/2022 1505 Gross per 24 hour  Intake 560 ml  Output 2902 ml  Net -2342 ml   Filed Weights   12/15/22 1235  Weight: 108.5 kg    Exam: General: NAD  Cardiovascular: S1, S2 present Respiratory: Diminished breath sounds bilaterally Abdomen: Soft, nontender, nondistended, bowel sounds present Musculoskeletal: No bilateral pedal edema noted Skin: Normal Psychiatry: Normal mood     Data Reviewed: CBC: Recent Labs  Lab 12/14/22 1223 12/15/22 0806 12/15/22 2250 12/16/22 0452 12/16/22 1326 12/17/22 0438  WBC 9.1 8.5  --  10.5  --  8.7  NEUTROABS 6.4  --   --   --   --  6.1  HGB 6.2 Repeated and verified X2.* 5.7* 9.6* 9.6* 10.2* 9.4*  HCT 20.3 Repeated and verified X2.* 20.1* 31.4* 31.1* 32.9* 31.6*  MCV 63.8 Repeated and verified X2.* 67.0*  --  74.9*  --  78.8*  PLT 283.0 285  --  233  --  0000000   Basic Metabolic Panel: Recent Labs  Lab 12/14/22 1223 12/15/22 0806 12/16/22 0452 12/17/22 0438  NA 142 137 139 139  K 4.0 3.1* 3.8 3.7  CL 103 101 104 104  CO2 '30 26 25 23  '$ GLUCOSE 164* 152* 172* 129*  BUN '15 17 15 9  '$ CREATININE 1.15 1.35* 0.89 1.01*  CALCIUM 9.5 8.4* 8.7* 9.0   GFR: Estimated Creatinine Clearance: 63 mL/min (A) (by C-G formula based on SCr of 1.01 mg/dL (H)). Liver Function Tests: Recent Labs  Lab 12/15/22 0806  AST 19  ALT 16  ALKPHOS 73  BILITOT 0.6  PROT 7.3  ALBUMIN 3.6   No results for input(s): "LIPASE", "AMYLASE" in the last 168 hours. No results for input(s): "AMMONIA" in the last 168 hours. Coagulation Profile: Recent Labs  Lab 12/15/22 0823  INR 1.2   Cardiac Enzymes: No results for input(s): "CKTOTAL", "CKMB", "CKMBINDEX", "TROPONINI" in the last 168 hours. BNP (last 3 results) Recent Labs    12/14/22 1223  PROBNP 164.0*   HbA1C: Recent Labs    12/15/22 0806  HGBA1C 6.5*   CBG: Recent Labs  Lab 12/16/22 1616 12/16/22 2049 12/17/22 0726 12/17/22 1224 12/17/22 1340  GLUCAP 159* 223* 136* 145* 134*   Lipid Profile: No results for input(s): "CHOL", "HDL", "LDLCALC", "TRIG", "CHOLHDL", "LDLDIRECT" in the last 72 hours. Thyroid Function Tests: No results for input(s): "TSH", "T4TOTAL", "FREET4", "T3FREE", "THYROIDAB" in the last 72 hours. Anemia Panel: No results for input(s): "VITAMINB12", "FOLATE", "FERRITIN", "TIBC", "IRON", "RETICCTPCT" in the last 72 hours. Urine analysis:     Component Value Date/Time   COLORURINE YELLOW 09/05/2022 0237   APPEARANCEUR CLEAR 09/05/2022 0237   LABSPEC 1.013 09/05/2022 0237   PHURINE 5.0 09/05/2022 0237   GLUCOSEU NEGATIVE 09/05/2022 0237   HGBUR NEGATIVE 09/05/2022 0237   BILIRUBINUR NEGATIVE 09/05/2022 0237   KETONESUR NEGATIVE 09/05/2022 0237   PROTEINUR NEGATIVE 09/05/2022 0237   UROBILINOGEN 0.2 08/18/2008 1535   NITRITE NEGATIVE 09/05/2022 0237   LEUKOCYTESUR TRACE (A) 09/05/2022 0237   Sepsis Labs: '@LABRCNTIP'$ (procalcitonin:4,lacticidven:4)  )No results found for this or any previous visit (from the past 240 hour(s)).    Studies: DG CHEST PORT 1 VIEW  Result Date: 12/16/2022 CLINICAL DATA:  Shortness of breath. EXAM: PORTABLE CHEST 1 VIEW COMPARISON:  X-ray 09/04/2022.  Recent CT scan 11/23/2022 FINDINGS: Underinflation. Under penetrated x-ray. There is new parenchymal opacity along the right mid to upper lung. Acute infiltrates possible such as pneumonia. Recommend follow-up. The heart  enlarged. Significant calcification of the mitral valve annulus. Slight central vascular congestion. No pneumothorax. Overlapping cardiac leads. IMPRESSION: New ill-defined parenchymal opacity right mid upper lung. Acute process such as pneumonia is possible. Recommend follow-up from clearance. Heart is enlarged with a additional central vascular congestion. Under penetrated and underinflated x-ray Electronically Signed   By: Jill Side M.D.   On: 12/16/2022 15:58    Scheduled Meds:  albuterol  2.5 mg Nebulization BID   diltiazem  180 mg Oral Daily   folic acid  2 mg Oral Daily   furosemide  40 mg Intravenous Daily   hydroxychloroquine  200 mg Oral BID   insulin aspart  0-15 Units Subcutaneous TID WC   insulin aspart  0-5 Units Subcutaneous QHS   pantoprazole (PROTONIX) IV  40 mg Intravenous Q24H   tirzepatide  7.5 mg Subcutaneous Weekly    Continuous Infusions:   LOS: 0 days     Alma Friendly, MD [facility]  If 7PM-7AM, please contact night-coverage [email or web address] 12/17/2022, 3:36 PM

## 2022-12-17 NOTE — Anesthesia Postprocedure Evaluation (Signed)
Anesthesia Post Note  Patient: Patsy Hershey  Procedure(s) Performed: ESOPHAGOGASTRODUODENOSCOPY (EGD) BIOPSY     Patient location during evaluation: Endoscopy Anesthesia Type: MAC Level of consciousness: oriented, awake and alert and awake Pain management: pain level controlled Vital Signs Assessment: post-procedure vital signs reviewed and stable Respiratory status: spontaneous breathing, nonlabored ventilation, respiratory function stable and patient connected to nasal cannula oxygen Cardiovascular status: blood pressure returned to baseline and stable Postop Assessment: no headache, no backache and no apparent nausea or vomiting Anesthetic complications: no   No notable events documented.  Last Vitals:  Vitals:   12/17/22 1349 12/17/22 1414  BP: (!) 141/86 (!) 156/95  Pulse: 77 80  Resp: 15 16  Temp:  36.7 C  SpO2: 100% 99%    Last Pain:  Vitals:   12/17/22 1414  TempSrc: Oral  PainSc:                  Santa Lighter

## 2022-12-17 NOTE — Progress Notes (Signed)
Pt refused cpap, states she does not like our machines.

## 2022-12-17 NOTE — Anesthesia Preprocedure Evaluation (Addendum)
Anesthesia Evaluation  Patient identified by MRN, date of birth, ID band Patient awake    Reviewed: Allergy & Precautions, NPO status , Patient's Chart, lab work & pertinent test results  Airway Mallampati: III  TM Distance: >3 FB Neck ROM: Full    Dental  (+) Dental Advisory Given, Poor Dentition, Missing   Pulmonary sleep apnea and Continuous Positive Airway Pressure Ventilation , COPD, former smoker ILD 3L Lynn [name] 24hrs/day   Pulmonary exam normal breath sounds clear to auscultation       Cardiovascular hypertension, Pt. on medications + CAD, +CHF and + DOE  + dysrhythmias Atrial Fibrillation (-) pacemaker+ Valvular Problems/Murmurs (MS)  Rhythm:Regular Rate:Normal + Diastolic murmurs ECHO [id number] showed EF of 60-65%, grade 1 DD   Neuro/Psych  Headaches PSYCHIATRIC DISORDERS Anxiety Depression       GI/Hepatic Neg liver ROS, PUD,GERD  Medicated,,  Endo/Other  diabetes, Insulin Dependent  Morbid obesity  Renal/GU Renal disease (AKI)     Musculoskeletal  (+) Arthritis , Rheumatoid disorders,    Abdominal   Peds  Hematology  (+) Blood dyscrasia (Eliquis), anemia   Anesthesia Other Findings Day of surgery medications reviewed with the patient.  Reproductive/Obstetrics                             Anesthesia Physical Anesthesia Plan  ASA: 4  Anesthesia Plan: MAC   Post-op Pain Management: Minimal or no pain anticipated   Induction: Intravenous  PONV Risk Score and Plan: 2 and TIVA and Treatment may vary due to age or medical condition  Airway Management Planned: Natural Airway and Simple Face Mask  Additional Equipment:   Intra-op Plan:   Post-operative Plan:   Informed Consent: I have reviewed the patients History and Physical, chart, labs and discussed the procedure including the risks, benefits and alternatives for the proposed anesthesia with the patient or authorized representative  who has indicated his/her understanding and acceptance.     Dental advisory given  Plan Discussed with: CRNA  Anesthesia Plan Comments:         Anesthesia Quick Evaluation

## 2022-12-17 NOTE — Op Note (Signed)
[name] Patient Name: Lynn [name] Procedure Date: 12/17/2022 MRN: [id number] Attending MD: Docia Chuck. Henrene Pastor , MD, [id number] Date of Birth: 01-12-1956 CSN: [id number] Age: 67 Admit Type: Inpatient Procedure:                Upper GI endoscopy with biopsies Indications:              Acute post hemorrhagic anemia, Melena Providers:                Docia Chuck. Henrene Pastor, MD, Jaci Carrel, RN, Gloris Ham, Technician Referring MD:             [facility] Medicines:                Monitored Anesthesia Care Complications:            No immediate complications. Estimated Blood Loss:     Estimated blood loss: none. Procedure:                Pre-Anesthesia Assessment:                           - Prior to the procedure, a History and Physical                            was performed, and patient medications and                            allergies were reviewed. The patient's tolerance of                            previous anesthesia was also reviewed. The risks                            and benefits of the procedure and the sedation                            options and risks were discussed with the patient.                            All questions were answered, and informed consent                            was obtained. Prior Anticoagulants: The patient has                            taken Eliquis (apixaban), last dose was 3 days                            prior to procedure. ASA Grade Assessment: III - A                            patient with severe systemic disease. After  reviewing the risks and benefits, the patient was                            deemed in satisfactory condition to undergo the                            procedure.                           After obtaining informed consent, the endoscope was                            passed under direct vision. Throughout the                             procedure, the patient's blood pressure, pulse, and                            oxygen saturations were monitored continuously. The                            [id number] Olympus endoscope was introduced                            through the mouth, and advanced to the second part                            of duodenum. The upper GI endoscopy was                            accomplished without difficulty. The patient                            tolerated the procedure well. Scope In: Scope Out: Findings:      The esophagus was normal.      The stomach revealed a small sliding hiatal hernia.      One non-bleeding linear gastric ulcer with no stigmata of bleeding was       found in the gastric antrum. Biopsies of the antrum were taken with a       cold forceps for histology, to rule out H. pylori.      The cardia and gastric fundus were normal on retroflexion.      The examined duodenum was normal. There was no blood in the upper GI       tract. Impression:               - Normal esophagus.                           - Normal stomach save hiatal hernia and ulcer as                            noted below.                           - Non-bleeding gastric ulcer with no  stigmata of                            bleeding. Biopsied.                           - Normal examined duodenum. Moderate Sedation:      none Recommendation:           - Patient has a contact number available for                            emergencies. The signs and symptoms of potential                            delayed complications were discussed with the                            patient. Return to normal activities tomorrow.                            Written discharge instructions were provided to the                            patient.                           - Resume previous diet.                           - Continue present medications.                           - Await pathology results.                            - Change pantoprazole to 40 mg p.o. twice daily for                            1 month then 40 mg p.o. daily indefinitely                           - Resume anticoagulation in 48 hours                           - Regular diet                           - Anticipate discharge in a.m.                           - Outpatient GI follow-up with Dr. Fuller Plan (or his                            advanced practitioner) in 2 to 4 weeks                           Discussed  with patient. She was provided a copy of                            her report. GI will sign off. Procedure Code(s):        --- Professional ---                           [phone], Esophagogastroduodenoscopy, flexible,                            transoral; with biopsy, single or multiple Diagnosis Code(s):        --- Professional ---                           K25.9, Gastric ulcer, unspecified as acute or                            chronic, without hemorrhage or perforation                           D62, Acute posthemorrhagic anemia                           K92.1, Melena (includes Hematochezia) CPT copyright 2022 American [employer] Association. All rights reserved. The codes documented in this report are preliminary and upon coder review may  be revised to meet current compliance requirements. Docia Chuck. Henrene Pastor, MD 12/17/2022 1:36:02 PM This report has been signed electronically. Number of Addenda: 0

## 2022-12-17 NOTE — Telephone Encounter (Signed)
Patient has been scheduled for a 2-week hospital follow up with Dr. Fuller Plan on 12/31/22 at 10:10 am. Appt information has been mailed and sent to patient via [place].

## 2022-12-17 NOTE — Transfer of Care (Signed)
Immediate Anesthesia Transfer of Care Note  Patient: Lynn [name]  Procedure(s) Performed: ESOPHAGOGASTRODUODENOSCOPY (EGD) BIOPSY  Patient Location: PACU and Endoscopy Unit  Anesthesia Type:MAC  Level of Consciousness: awake, alert , oriented, and patient cooperative  Airway & Oxygen Therapy: Patient Spontanous Breathing and Patient connected to face mask oxygen  Post-op Assessment: Report given to RN and Post -op Vital signs reviewed and stable  Post vital signs: Reviewed and stable  Last Vitals:  Vitals Value Taken Time  BP 109/67 12/17/22 1332  Temp 36.3 C 12/17/22 1332  Pulse 83 12/17/22 1332  Resp 23 12/17/22 1332  SpO2 100 % 12/17/22 1332    Last Pain:  Vitals:   12/17/22 1332  TempSrc: Temporal  PainSc: 0-No pain         Complications: No notable events documented.

## 2022-12-17 NOTE — Telephone Encounter (Signed)
-----   Message from [place], [place] sent at 12/17/2022  2:04 PM EST ----- Regarding: appt 2-4 weeks Needs appt with Dr Fuller Plan or any APP in 2-4 weeks (I only saw her briefly today) for fu gastric ulcer, EGD was done today.  Thanks-JLL

## 2022-12-17 NOTE — Progress Notes (Addendum)
Progress Note   Subjective  Chief Complaint: Melena and anemia  Today, patient seen down in endo unit.  Doing well, last episode of melena was Saturday afternoon 12/15/22, none since. No abdominal pain.    Objective   Vital signs in last 24 hours: Temp:  [97.1 F (36.2 C)-99.5 F (37.5 C)] 97.1 F (36.2 C) (03/04 1217) Pulse Rate:  [81-96] 81 (03/04 0529) Resp:  [11-20] 11 (03/04 1217) BP: (133-171)/(63-91) 147/85 (03/04 1217) SpO2:  [97 %-99 %] 99 % (03/04 1217) Last BM Date : 12/15/22 General:    AA female in NAD Heart:  Regular rate and rhythm; no murmurs Lungs: Respirations even and unlabored, lungs CTA bilaterally Abdomen:  Soft, nontender and nondistended. Normal bowel sounds. Psych:  Cooperative. Normal mood and affect.  Intake/Output from previous day: 03/03 0701 - 03/04 0700 In: 240 [P.O.:240] Out: 2100 [Urine:2100] Intake/Output this shift: Total I/O In: 120 [P.O.:120] Out: 800 [Urine:800]  Lab Results: Recent Labs    12/15/22 0806 12/15/22 2250 12/16/22 0452 12/16/22 1326 12/17/22 0438  WBC 8.5  --  10.5  --  8.7  HGB 5.7*   < > 9.6* 10.2* 9.4*  HCT 20.1*   < > 31.1* 32.9* 31.6*  PLT 285  --  233  --  243   < > = values in this interval not displayed.   BMET Recent Labs    12/15/22 0806 12/16/22 0452 12/17/22 0438  NA 137 139 139  K 3.1* 3.8 3.7  CL 101 104 104  CO2 '26 25 23  '$ GLUCOSE 152* 172* 129*  BUN '17 15 9  '$ CREATININE 1.35* 0.89 1.01*  CALCIUM 8.4* 8.7* 9.0   LFT Recent Labs    12/15/22 0806  PROT 7.3  ALBUMIN 3.6  AST 19  ALT 16  ALKPHOS 73  BILITOT 0.6   PT/INR Recent Labs    12/15/22 0823  LABPROT 15.2  INR 1.2    Studies/Results: DG CHEST PORT 1 VIEW  Result Date: 12/16/2022 CLINICAL DATA:  Shortness of breath. EXAM: PORTABLE CHEST 1 VIEW COMPARISON:  X-ray 09/04/2022.  Recent CT scan 11/23/2022 FINDINGS: Underinflation. Under penetrated x-ray. There is new parenchymal opacity along the right mid to upper lung.  Acute infiltrates possible such as pneumonia. Recommend follow-up. The heart enlarged. Significant calcification of the mitral valve annulus. Slight central vascular congestion. No pneumothorax. Overlapping cardiac leads. IMPRESSION: New ill-defined parenchymal opacity right mid upper lung. Acute process such as pneumonia is possible. Recommend follow-up from clearance. Heart is enlarged with a additional central vascular congestion. Under penetrated and underinflated x-ray Electronically Signed   By: Jill Side M.D.   On: 12/16/2022 15:58     Assessment / Plan:   Assessment: 1.  Iron deficiency anemia due to acute blood loss: History of melena and history of gastric AVM/gastric ulcers, repeat EGD planned for today after Eliquis washout, last dose 12/14/2022 PM, hemoglobin stable now after 3 unit transfusion at time of admission, currently 9.4 2.  Colonic diverticulosis 3.  AKI: Thought from dehydration, improved today 4.  Idiopathic pulmonary fibrosis/OSA 5.  Morbid obesity 6.  Rheumatoid arthritis  Plan: EGD today with Dr. Henrene Pastor for further evaluation of anemia and melena. Risks, benefits, limitations and alternatives previously discussed with Dr. Collene Mares.  N.p.o. for now Continue to monitor hemoglobin and transfusion as needed less than 7 Please await further recommendations from Dr. Henrene Pastor after time procedure.  Thank you for your kind consultation.   LOS: 0 days  Lynn [name]  12/17/2022, 12:30 PM  GI ATTENDING  History and data reviewed.  Patient seen and examined in endoscopy.  Agree with interval progress note.  Now for upper endoscopy to evaluate melena.  Eliquis has washed out.  Patient is high risk due to her comorbidities and chronic anticoagulation state.  Docia Chuck. Geri [place]in[place]., M.D. [facility] Division of Gastroenterology

## 2022-12-18 ENCOUNTER — Observation Stay (HOSPITAL_COMMUNITY): Payer: Medicare Other

## 2022-12-18 DIAGNOSIS — D62 Acute posthemorrhagic anemia: Secondary | ICD-10-CM | POA: Diagnosis not present

## 2022-12-18 DIAGNOSIS — R0600 Dyspnea, unspecified: Secondary | ICD-10-CM | POA: Diagnosis not present

## 2022-12-18 DIAGNOSIS — J90 Pleural effusion, not elsewhere classified: Secondary | ICD-10-CM | POA: Diagnosis not present

## 2022-12-18 DIAGNOSIS — R079 Chest pain, unspecified: Secondary | ICD-10-CM | POA: Diagnosis not present

## 2022-12-18 DIAGNOSIS — J811 Chronic pulmonary edema: Secondary | ICD-10-CM | POA: Diagnosis not present

## 2022-12-18 LAB — CBC WITH DIFFERENTIAL/PLATELET
Abs Immature Granulocytes: 0.03 10*3/uL (ref 0.00–0.07)
Basophils Absolute: 0.1 10*3/uL (ref 0.0–0.1)
Basophils Relative: 1 %
Eosinophils Absolute: 0.4 10*3/uL (ref 0.0–0.5)
Eosinophils Relative: 4 %
HCT: 31.3 % — ABNORMAL LOW (ref 36.0–46.0)
Hemoglobin: 9.5 g/dL — ABNORMAL LOW (ref 12.0–15.0)
Immature Granulocytes: 0 %
Lymphocytes Relative: 16 %
Lymphs Abs: 1.4 10*3/uL (ref 0.7–4.0)
MCH: 23.2 pg — ABNORMAL LOW (ref 26.0–34.0)
MCHC: 30.4 g/dL (ref 30.0–36.0)
MCV: 76.5 fL — ABNORMAL LOW (ref 80.0–100.0)
Monocytes Absolute: 1.1 10*3/uL — ABNORMAL HIGH (ref 0.1–1.0)
Monocytes Relative: 13 %
Neutro Abs: 5.6 10*3/uL (ref 1.7–7.7)
Neutrophils Relative %: 66 %
Platelets: 243 10*3/uL (ref 150–400)
RBC: 4.09 MIL/uL (ref 3.87–5.11)
RDW: 26.3 % — ABNORMAL HIGH (ref 11.5–15.5)
WBC: 8.5 10*3/uL (ref 4.0–10.5)
nRBC: 0.4 % — ABNORMAL HIGH (ref 0.0–0.2)

## 2022-12-18 LAB — BASIC METABOLIC PANEL
Anion gap: 11 (ref 5–15)
BUN: 13 mg/dL (ref 8–23)
CO2: 28 mmol/L (ref 22–32)
Calcium: 8.9 mg/dL (ref 8.9–10.3)
Chloride: 98 mmol/L (ref 98–111)
Creatinine, Ser: 1.03 mg/dL — ABNORMAL HIGH (ref 0.44–1.00)
GFR, Estimated: 59 mL/min — ABNORMAL LOW (ref >60)
Glucose, Bld: 143 mg/dL — ABNORMAL HIGH (ref 70–99)
Potassium: 3.6 mmol/L (ref 3.5–5.1)
Sodium: 137 mmol/L (ref 135–145)

## 2022-12-18 LAB — GLUCOSE, CAPILLARY
Glucose-Capillary: 174 mg/dL — ABNORMAL HIGH (ref 70–99)
Glucose-Capillary: 191 mg/dL — ABNORMAL HIGH (ref 70–99)

## 2022-12-18 LAB — SURGICAL PATHOLOGY

## 2022-12-18 MED ORDER — APIXABAN 5 MG PO TABS: 5.0000 mg | ORAL_TABLET | Freq: Two times a day (BID) | ORAL | 3 refills | Status: DC

## 2022-12-18 MED ORDER — FUROSEMIDE 20 MG PO TABS: ORAL_TABLET | ORAL | 0 refills | Status: DC

## 2022-12-18 MED ORDER — PANTOPRAZOLE SODIUM 40 MG PO TBEC: 40.0000 mg | DELAYED_RELEASE_TABLET | Freq: Two times a day (BID) | ORAL | 0 refills | Status: DC

## 2022-12-18 NOTE — Progress Notes (Signed)
Mobility Specialist - Progress Note   12/18/22 1047  Oxygen Therapy  O2 Device Nasal Cannula  O2 Flow Rate (L/min) 2 L/min  Mobility  Activity Ambulated independently in hallway  Level of Assistance Independent  Assistive Device None  Distance Ambulated (ft) 480 ft  Activity Response Tolerated well  Mobility Referral Yes  $Mobility charge 1 Mobility   Pt received in bathroom standing and agreeable to mobility. Pt took 1x standing rest break to bring O2 back up. No complaints during session. Pt to EOB after session with all needs met.    Pre-mobility: 93 HR, 92% SpO2 (2L [place]) During mobility: 103 HR, 93% SpO2 (2L [place]) Post-mobility: 111 HR, 94% SPO2 (2L [place])  Set designer

## 2022-12-18 NOTE — Discharge Summary (Signed)
Physician Discharge Summary   Patient: Lynn [name] DOB: [birth year]  Admit date:     12/15/2022  Discharge date: 12/18/22  Discharge Physician: Lynn [name]   PCP: Lynn Mclean, MD   Recommendations at discharge:   Follow-up with PCP in 1 week Follow-up with GI as scheduled  Discharge Diagnoses: Principal Problem:   ABLA (acute blood loss anemia) Active Problems:   Essential hypertension   Chronic diastolic CHF (congestive heart failure) (HCC)   Type 2 diabetes mellitus with hyperlipidemia (HCC)   Hyperlipidemia associated with type 2 diabetes mellitus (HCC)   PAF (paroxysmal atrial fibrillation) ([place]); CHA2DS2-VASc score 7   Class 3 obesity (HCC)   ILD (interstitial lung disease) ([place])   OSA on CPAP    Hospital Course: Lynn [name] is a 67 y.o. female with medical history significant for CAD, anxiety, depression, chronic diastolic dysfunction, gastric ulcers, hypertension, hyperlipidemia admitted to the hospital for ongoing melena for the past couple of weeks. Patient states that she had a follow-up with her pulmonologist where lab work was done and was found to have a hemoglobin of 8.2 and was instructed to go to the ER. In the ED, hemoglobin was noted to be 5.7, creatinine is up to 1.35 from baseline of about 1.1, potassium 3.1. Hospitalist was contacted for admission, gastroenterology was consulted by ED provider.    Patient denies any other new complaints, reports feeling much better and is very eager to be discharged.  Denies any worsening shortness of breath, chest pain, abdominal pain, nausea/vomiting, fever/chills.  Denies any further bright red blood per rectum/melena.  Patient to hold Eliquis till 12/19/2022.  Patient to take extra Lasix pending on her weight as discussed.   Assessment and Plan:  Likely upper GI bleeding Acute blood loss anemia History of gastric AVMs, gastric ulcer, diverticulosis History of  thalassemia Hemoglobin down from 8.2-->5.7 S/p 3 units of PRBC on 12/15/2022, hemoglobin now stable GI on board, s/p EGD on 3/4--> noted nonbleeding gastric ulcer with no stigmata of bleeding, biopsied PPI BID Continue to hold Eliquis till 12/19/22 Follow up with PCP and GI as scheduled   HTN Resume losartan and imdur   Chronic diastolic HF Likely pulmonary edema from possible transfusion Probnp 164 ECHO 2/24 showed EF of 60-65%, grade 1 DD CXR with noted vascular congestion with pulm edema, repeat CXR with resolution of Pulm edema, no infiltrate noted S/p IV lasix, continue PO lasix as discussed  Resume home losartan, imdur   Diabetes mellitus type 2 Last A1c 7.6 on 03/2022 Resume insulin pump at home, with continuous glucose monitoring   Paroxysmal A-fib Resume PTA diltiazem Continue to Hold Eliquis, restart on 3/6   ILD Chronic hypoxic resp failure Patient on 3 L of O2 at baseline DuoNebs scheduled and as needed   GERD Continue PPI BID   Rheumatoid arthritis Continue Plaquenil   Morbid obesity Lifestyle modification advised   OSA CPAP     Consultants: GI Procedures performed: EGD Disposition: Home Diet recommendation:  Cardiac and Carb modified diet   DISCHARGE MEDICATION: Allergies as of 12/18/2022       Reactions   Methotrexate Other (See Comments)   Pneumonitis   Penicillins Hives   Childhood allergy Has patient had a PCN reaction causing immediate rash, facial/tongue/throat swelling, SOB or lightheadedness with hypotension: Yes Has patient had a PCN reaction causing severe rash involving mucus membranes or skin necrosis: No Has patient had a PCN reaction that required hospitalization No Has  patient had a PCN reaction occurring within the last 10 years: No If all of the above answers are "NO", then may proceed with Cephalosporin use.   [name] [dapagliflozin] Other (See Comments)   Dizzy and lethargic   Metformin And Related Other (See Comments)    Diarrhea, bleeding        Medication List     STOP taking these medications    sucralfate 1 g tablet Commonly known as: CARAFATE       TAKE these medications    acetaminophen 500 MG tablet Commonly known as: TYLENOL Take 500 mg by mouth every 6 (six) hours as needed for fever or moderate pain.   albuterol 108 (90 Base) MCG/ACT inhaler Commonly known as: VENTOLIN HFA Inhale 2 puffs into the lungs every 6 (six) hours as needed for wheezing or shortness of breath. What changed: when to take this   apixaban 5 MG Tabs tablet Commonly known as: ELIQUIS Take 1 tablet (5 mg total) by mouth 2 (two) times daily. Resume taking on 12/19/22 What changed: additional instructions   atorvastatin 80 MG tablet Commonly known as: LIPITOR Take 1 tablet (80 mg total) by mouth every evening.   B-D UF III MINI PEN NEEDLES 31G X 5 MM Misc Generic drug: Insulin Pen Needle USE DAILY WITH VICTOZA AND LANTUS. What changed: additional instructions   Insulin Pen Needle 31G X 5 MM Misc 1 Device by Does not apply route 3 (three) times daily. What changed: Another medication with the same name was changed. Make sure you understand how and when to take each.   cetirizine 10 MG tablet Commonly known as: ZYRTEC Take 10 mg by mouth daily as needed for allergies.   Contour Next Test test strip Generic drug: glucose blood 3x daily   Dexcom G6 Sensor Misc 1 Device by Does not apply route as directed.   Dexcom G6 Transmitter Misc 1 Device by Does not apply route as directed.   diltiazem 360 MG 24 hr capsule Commonly known as: CARDIZEM CD Take 1 capsule (360 mg total) by mouth daily.   folic acid 1 MG tablet Commonly known as: FOLVITE Take 2 tablets (2 mg total) by mouth daily. What changed:  how much to take when to take this   furosemide 20 MG tablet Commonly known as: Lasix Decrease Lasix ( furosemide)  to 20 mg ( 1/2/tablet)  , with 20 mg  you may take a an additional 20 mg if need  for swelling or weight gain more than 3 lbs overnight or 5 lbs in one week -- if you have continue to take an additional 20 mg , go back to 40 mg  Take additional dose as needed for egt gain> 3 lb in 1 day What changed:  how much to take how to take this when to take this reasons to take this additional instructions   hydroxychloroquine 200 MG tablet Commonly known as: PLAQUENIL Take 200 mg by mouth 2 (two) times daily.   insulin lispro 100 UNIT/ML injection Commonly known as: HUMALOG Max Daily 100 units per pump What changed: additional instructions   isosorbide mononitrate 30 MG 24 hr tablet Commonly known as: IMDUR Take 1 tablet (30 mg total) by mouth daily.   ketoconazole 2 % cream Commonly known as: NIZORAL Apply 1 Application topically daily.   Lancets 30G Misc 1 Device by Does not apply route 3 (three) times daily.   leflunomide 20 MG tablet Commonly known as: ARAVA Take 20 mg by  mouth daily.   losartan 100 MG tablet Commonly known as: COZAAR Take 0.5 tablets (50 mg total) by mouth daily.   Melatonin 5 MG Chew Chew 5 mg by mouth at bedtime.   multivitamin with minerals Tabs tablet Take 1 tablet by mouth daily.   Omnipod 5 G6 Intro (Gen 5) Kit 1 Device by Does not apply route every other day.   Omnipod 5 G6 Pods (Gen 5) Misc 1 Device by Does not apply route every other day.   pantoprazole 40 MG tablet Commonly known as: PROTONIX Take 1 tablet (40 mg total) by mouth 2 (two) times daily before a meal.   predniSONE 10 MG tablet Commonly known as: DELTASONE 4 tabs for 2 days, then 3 tabs for 2 days, 2 tabs for 2 days, then 1 tab for 2 days, then stop   SAFETY-LOK TB SYRINGE 27GX.5" 27G X 1/2" 1 ML Misc Generic drug: TUBERCULIN SYR 1CC/27GX1/2"   tirzepatide 7.5 MG/0.5ML Pen Commonly known as: MOUNJARO Inject 7.5 mg into the skin once a week.   traZODone 50 MG tablet Commonly known as: DESYREL Take 0.5-1 tablets (25-50 mg total) by mouth at bedtime as  needed. for sleep What changed:  reasons to take this additional instructions   VICKS SINEX NA Place 1 spray into the nose daily as needed (congestion).        Follow-up Information     Copland, Gay Filler, MD. Schedule an appointment as soon as possible for a visit in 1 week(s).   Specialty: Family Medicine Contact information: [place] [id number]         Ladene Artist, MD Follow up.   Specialty: Gastroenterology Why: Office will call for a follow up appointment Contact information: [place] [place]  [phone]                Discharge Exam: Filed Weights   12/15/22 1235 12/18/22 1026  Weight: 108.5 kg 105.4 kg   General: NAD  Cardiovascular: S1, S2 present Respiratory: Diminished BS bilaterally Abdomen: Soft, nontender, nondistended, bowel sounds present Musculoskeletal: No bilateral pedal edema noted Skin: Normal Psychiatry: Normal mood   Condition at discharge: stable  The results of significant diagnostics from this hospitalization (including imaging, microbiology, ancillary and laboratory) are listed below for reference.   Imaging Studies: DG CHEST PORT 1 VIEW  Result Date: 12/18/2022 CLINICAL DATA:  Difficulty breathing, fluid overload EXAM: PORTABLE CHEST 1 VIEW COMPARISON:  Previous studies including the examination of 12/16/2022 FINDINGS: Transverse diameter of heart is slightly increased. There is interval decrease in pulmonary vascular congestion and improvement in aeration in both lungs. There are no signs of pulmonary edema or new focal infiltrates. There is blunting of both lateral CP angles. There is no pneumothorax. There is soft tissue calcification adjacent to the proximal right humerus, possibly suggesting calcific bursitis. IMPRESSION: There is interval decrease in pulmonary vascular congestion and clearing of pulmonary edema. There are no signs of pulmonary edema or new focal  infiltrates. Small bilateral pleural effusions are seen. Electronically Signed   By: Elmer Picker M.D.   On: 12/18/2022 08:05   DG CHEST PORT 1 VIEW  Result Date: 12/16/2022 CLINICAL DATA:  Shortness of breath. EXAM: PORTABLE CHEST 1 VIEW COMPARISON:  X-ray 09/04/2022.  Recent CT scan 11/23/2022 FINDINGS: Underinflation. Under penetrated x-ray. There is new parenchymal opacity along the right mid to upper lung. Acute infiltrates possible such as pneumonia. Recommend follow-up. The heart enlarged. Significant calcification  of the mitral valve annulus. Slight central vascular congestion. No pneumothorax. Overlapping cardiac leads. IMPRESSION: New ill-defined parenchymal opacity right mid upper lung. Acute process such as pneumonia is possible. Recommend follow-up from clearance. Heart is enlarged with a additional central vascular congestion. Under penetrated and underinflated x-ray Electronically Signed   By: Jill Side M.D.   On: 12/16/2022 15:58   ECHOCARDIOGRAM COMPLETE  Result Date: 11/30/2022    ECHOCARDIOGRAM REPORT   Patient Name:   KIMIMILA CAMMON Date of Exam: 11/30/2022 Medical Rec #:  [name]               Height:       63.0 in Accession #:    [id number]              Weight:       241.2 lb Date of Birth:  June 05, 1956                BSA:          2.094 m Patient Age:    27 years                BP:           132/72 mmHg Patient Gender: F                       HR:           90 bpm. Exam Location:  [facility] Procedure: 2D Echo, Cardiac Doppler, Color Doppler and Intracardiac            Opacification Agent Indications:    I34.2 Nonrheumatic mitral (valve) stenosis  History:        Patient has prior history of Echocardiogram examinations, most                 recent 04/26/2022. CAD, Arrythmias:Atrial Fibrillation and PVC,                 Signs/Symptoms:Syncope; Risk Factors:Hypertension, Diabetes,                 Dyslipidemia, Sleep Apnea and Former Smoker. Paroxysmal atrial                  tachycardia. Morbid obesity. Dyspnea on exertion. Interstitial                 lung disease.  Sonographer:    Diamond Nickel RCS Referring Phys: [place]  1. Left ventricular ejection fraction, by estimation, is 60 to 65%. The left ventricle has normal function. The left ventricle has no regional wall motion abnormalities. There is mild concentric left ventricular hypertrophy. Left ventricular diastolic parameters are consistent with Grade I diastolic dysfunction (impaired relaxation).  2. Right ventricular systolic function is normal. The right ventricular size is normal.  3. Left atrial size was moderately dilated.  4. Large calcified mass at posterior mitral annulus is likely exuberant mitral annular calcification. The mitral valve is degenerative. No evidence of mitral valve regurgitation. Moderate mitral stenosis. The mean mitral valve gradient is 12.0 mmHg, MVA  1.5 cm^2. Severe mitral annular calcification.  5. The aortic valve is tricuspid. There is moderate calcification of the aortic valve. Aortic valve regurgitation is not visualized. Aortic valve sclerosis/calcification is present, without any evidence of aortic stenosis.  6. The inferior vena cava is normal in size with greater than 50% respiratory variability, suggesting right atrial pressure of 3 mmHg. FINDINGS  Left Ventricle: Left  ventricular ejection fraction, by estimation, is 60 to 65%. The left ventricle has normal function. The left ventricle has no regional wall motion abnormalities. The left ventricular internal cavity size was normal in size. There is  mild concentric left ventricular hypertrophy. Left ventricular diastolic parameters are consistent with Grade I diastolic dysfunction (impaired relaxation). Right Ventricle: The right ventricular size is normal. No increase in right ventricular wall thickness. Right ventricular systolic function is normal. Left Atrium: Left atrial size was moderately dilated. Right  Atrium: Right atrial size was normal in size. Pericardium: There is no evidence of pericardial effusion. Mitral Valve: Large calcified mass at posterior mitral annulus is likely exuberant mitral annular calcification. The mitral valve is degenerative in appearance. There is moderate calcification of the mitral valve leaflet(s). Severe mitral annular calcification. No evidence of mitral valve regurgitation. Moderate mitral valve stenosis. MV peak gradient, 22.6 mmHg. The mean mitral valve gradient is 12.0 mmHg. Tricuspid Valve: The tricuspid valve is normal in structure. Tricuspid valve regurgitation is trivial. Aortic Valve: The aortic valve is tricuspid. There is moderate calcification of the aortic valve. Aortic valve regurgitation is not visualized. Aortic valve sclerosis/calcification is present, without any evidence of aortic stenosis. Pulmonic Valve: The pulmonic valve was normal in structure. Pulmonic valve regurgitation is not visualized. Aorta: The aortic root is normal in size and structure. Venous: The inferior vena cava is normal in size with greater than 50% respiratory variability, suggesting right atrial pressure of 3 mmHg. IAS/Shunts: No atrial level shunt detected by color flow Doppler.  LEFT VENTRICLE PLAX 2D LVIDd:         3.80 cm   Diastology LVIDs:         2.20 cm   LV e' medial:    9.29 cm/s LV PW:         1.30 cm   LV E/e' medial:  17.8 LV IVS:        1.10 cm   LV e' lateral:   7.14 cm/s LVOT diam:     2.00 cm   LV E/e' lateral: 23.1 LV SV:         94 LV SV Index:   45 LVOT Area:     3.14 cm  RIGHT VENTRICLE RV S prime:     16.60 cm/s TAPSE (M-mode): 3.0 cm LEFT ATRIUM             Index        RIGHT ATRIUM           Index LA diam:        3.40 cm 1.62 cm/m   RA Area:     11.20 cm LA Vol (A2C):   55.1 ml 26.31 ml/m  RA Volume:   21.30 ml  10.17 ml/m LA Vol (A4C):   95.4 ml 45.56 ml/m LA Biplane Vol: 73.5 ml 35.10 ml/m  AORTIC VALVE LVOT Vmax:   135.67 cm/s LVOT Vmean:  95.933 cm/s LVOT  VTI:    0.300 m  AORTA Ao Root diam: 3.10 cm Ao Asc diam:  2.70 cm MITRAL VALVE MV Area (PHT): 2.65 cm     SHUNTS MV Area VTI:   1.35 cm     Systemic VTI:  0.30 m MV Peak grad:  22.6 mmHg    Systemic Diam: 2.00 cm MV Mean grad:  12.0 mmHg MV Vmax:       2.38 m/s MV Vmean:      166.0 cm/s MV Decel Time: 287 msec MV E velocity: 165.00  cm/s MV A velocity: 200.67 cm/s MV E/A ratio:  0.82 Dalton McleanMD Electronically signed by Franki Monte Signature Date/Time: 11/30/2022/2:50:56 PM    Final    CT CHEST HIGH RESOLUTION  Result Date: 11/23/2022 CLINICAL DATA:  Interstitial lung disease EXAM: CT CHEST WITHOUT CONTRAST TECHNIQUE: Multidetector CT imaging of the chest was performed following the standard protocol without intravenous contrast. High resolution imaging of the lungs, as well as inspiratory and expiratory imaging, was performed. RADIATION DOSE REDUCTION: This exam was performed according to the departmental dose-optimization program which includes automated exposure control, adjustment of the mA and/or kV according to patient size and/or use of iterative reconstruction technique. COMPARISON:  High-resolution chest CT dated March 30, 2021 FINDINGS: Cardiovascular: Mild cardiomegaly. No pericardial effusion. Normal caliber thoracic aorta with moderate atherosclerotic disease. Severe three-vessel coronary artery calcifications. Caseous calcification of the mitral annulus. Mediastinum/Nodes: Esophagus and thyroid are unremarkable. No pathologically enlarged lymph nodes seen in the chest. Lungs/Pleura: Central airways are patent. Bilateral mosaic attenuation with evidence of air trapping on expiratory phase imaging. Mild areas of subpleural reticulation with minimal associated traction bronchiectasis and no clear craniocaudal predominance. No evidence of honeycomb change. No consolidation, pleural effusion or pneumothorax. Upper Abdomen: No acute abnormality. Musculoskeletal: No chest wall mass or suspicious  bone lesions identified. IMPRESSION: 1. Mild areas of subpleural reticulation with minimal associated traction bronchiectasis and no clear craniocaudal predominance. No evidence of progression when compared with most recent prior. Associated air trapping suggestive of hypersensitivity pneumonitis. Findings are suggestive of an alternative diagnosis (not UIP) per consensus guidelines: Diagnosis of Idiopathic Pulmonary Fibrosis: An Official ATS/ERS/JRS/ALAT Clinical Practice Guideline. [place], Iss 5, [id number], Jun 15 2017. 2. Severe three-vessel coronary artery calcifications. 3. Aortic Atherosclerosis (ICD10-I70.0). Electronically Signed   By: Yetta Glassman M.D.   On: 11/23/2022 23:06    Microbiology: Results for orders placed or performed during the hospital encounter of 11/13/21  Resp Panel by RT-PCR (Flu A&B, Covid) Nasopharyngeal Swab     Status: None   Collection Time: 11/13/21 10:00 PM   Specimen: Nasopharyngeal Swab; Nasopharyngeal(NP) swabs in vial transport medium  Result Value Ref Range Status   SARS Coronavirus 2 by RT PCR NEGATIVE NEGATIVE Final    Comment: (NOTE) SARS-CoV-2 target nucleic acids are NOT DETECTED.  The SARS-CoV-2 RNA is generally detectable in upper respiratory specimens during the acute phase of infection. The lowest concentration of SARS-CoV-2 viral copies this assay can detect is 138 copies/mL. A negative result does not preclude SARS-Cov-2 infection and should not be used as the sole basis for treatment or other patient management decisions. A negative result may occur with  improper specimen collection/handling, submission of specimen other than nasopharyngeal swab, presence of viral mutation(s) within the areas targeted by this assay, and inadequate number of viral copies(<138 copies/mL). A negative result must be combined with clinical observations, patient history, and epidemiological information. The expected result is  Negative.  Fact Sheet for Patients:  EntrepreneurPulse.com.au  Fact Sheet for Healthcare Providers:  IncredibleEmployment.be  This test is no t yet approved or cleared by the [place] FDA and  has been authorized for detection and/or diagnosis of SARS-CoV-2 by FDA under an Emergency Use Authorization (EUA). This EUA will remain  in effect (meaning this test can be used) for the duration of the COVID-19 declaration under Section 564(b)(1) of the Act, 21 U.S.C.section 360bbb-3(b)(1), unless the authorization is terminated  or revoked sooner.       Influenza  A by PCR NEGATIVE NEGATIVE Final   Influenza B by PCR NEGATIVE NEGATIVE Final    Comment: (NOTE) The Xpert Xpress SARS-CoV-2/FLU/RSV plus assay is intended as an aid in the diagnosis of influenza from Nasopharyngeal swab specimens and should not be used as a sole basis for treatment. Nasal washings and aspirates are unacceptable for Xpert Xpress SARS-CoV-2/FLU/RSV testing.  Fact Sheet for Patients: EntrepreneurPulse.com.au  Fact Sheet for Healthcare Providers: IncredibleEmployment.be  This test is not yet approved or cleared by the [place] FDA and has been authorized for detection and/or diagnosis of SARS-CoV-2 by FDA under an Emergency Use Authorization (EUA). This EUA will remain in effect (meaning this test can be used) for the duration of the COVID-19 declaration under Section 564(b)(1) of the Act, 21 U.S.C. section 360bbb-3(b)(1), unless the authorization is terminated or revoked.  Performed at [facility], [address] [place],  [address]     Labs: CBC: Recent Labs  Lab 12/14/22 1223 12/15/22 0806 12/15/22 2250 12/16/22 0452 12/16/22 1326 12/17/22 0438 12/18/22 0516  WBC 9.1 8.5  --  10.5  --  8.7 8.5  NEUTROABS 6.4  --   --   --   --  6.1 5.6  HGB 6.2 Repeated and verified X2.* 5.7* 9.6*  9.6* 10.2* 9.4* 9.5*  HCT 20.3 Repeated and verified X2.* 20.1* 31.4* 31.1* 32.9* 31.6* 31.3*  MCV 63.8 Repeated and verified X2.* 67.0*  --  74.9*  --  78.8* 76.5*  PLT 283.0 285  --  233  --  243 0000000   Basic Metabolic Panel: Recent Labs  Lab 12/14/22 1223 12/15/22 0806 12/16/22 0452 12/17/22 0438 12/18/22 0516  NA 142 137 139 139 137  K 4.0 3.1* 3.8 3.7 3.6  CL 103 101 104 104 98  CO2 '30 26 25 23 28  '$ GLUCOSE 164* 152* 172* 129* 143*  BUN '15 17 15 9 13  '$ CREATININE 1.15 1.35* 0.89 1.01* 1.03*  CALCIUM 9.5 8.4* 8.7* 9.0 8.9   Liver Function Tests: Recent Labs  Lab 12/15/22 0806  AST 19  ALT 16  ALKPHOS 73  BILITOT 0.6  PROT 7.3  ALBUMIN 3.6   CBG: Recent Labs  Lab 12/17/22 1224 12/17/22 1340 12/17/22 1643 12/17/22 2114 12/18/22 0725  GLUCAP 145* 134* 177* 240* 174*    Discharge time spent: less than 30 minutes.  Signed: Alma Friendly, MD [facility] 12/18/2022

## 2022-12-19 NOTE — Telephone Encounter (Signed)
Discussed with pt at [facility] on 3/1

## 2022-12-20 ENCOUNTER — Encounter (HOSPITAL_COMMUNITY): Payer: Self-pay | Admitting: Internal Medicine

## 2022-12-20 MED ORDER — ALBUTEROL SULFATE (2.5 MG/3ML) 0.083% IN NEBU: 2.5000 mg | INHALATION_SOLUTION | Freq: Four times a day (QID) | RESPIRATORY_TRACT | 5 refills | Status: DC | PRN

## 2022-12-20 MED ORDER — ALBUTEROL SULFATE HFA 108 (90 BASE) MCG/ACT IN AERS: 2.0000 | INHALATION_SPRAY | Freq: Four times a day (QID) | RESPIRATORY_TRACT | 2 refills | Status: DC | PRN

## 2022-12-26 ENCOUNTER — Ambulatory Visit (HOSPITAL_BASED_OUTPATIENT_CLINIC_OR_DEPARTMENT_OTHER): Payer: Medicare Other | Attending: Nurse Practitioner | Admitting: Pulmonary Disease

## 2022-12-26 DIAGNOSIS — R0683 Snoring: Secondary | ICD-10-CM | POA: Insufficient documentation

## 2022-12-26 DIAGNOSIS — G4733 Obstructive sleep apnea (adult) (pediatric): Secondary | ICD-10-CM | POA: Diagnosis present

## 2022-12-28 ENCOUNTER — Encounter: Payer: Self-pay | Admitting: Nurse Practitioner

## 2022-12-28 ENCOUNTER — Ambulatory Visit: Payer: Medicare Other | Admitting: Nurse Practitioner

## 2022-12-28 VITALS — BP 110/60 | HR 97 | Temp 98.5°F | Ht 63.0 in | Wt 234.8 lb

## 2022-12-28 DIAGNOSIS — D500 Iron deficiency anemia secondary to blood loss (chronic): Secondary | ICD-10-CM

## 2022-12-28 DIAGNOSIS — I5032 Chronic diastolic (congestive) heart failure: Secondary | ICD-10-CM

## 2022-12-28 DIAGNOSIS — J849 Interstitial pulmonary disease, unspecified: Secondary | ICD-10-CM

## 2022-12-28 DIAGNOSIS — D649 Anemia, unspecified: Secondary | ICD-10-CM

## 2022-12-28 DIAGNOSIS — J9611 Chronic respiratory failure with hypoxia: Secondary | ICD-10-CM

## 2022-12-28 DIAGNOSIS — G4733 Obstructive sleep apnea (adult) (pediatric): Secondary | ICD-10-CM

## 2022-12-28 LAB — CBC WITH DIFFERENTIAL/PLATELET
Basophils Absolute: 0.1 10*3/uL (ref 0.0–0.1)
Basophils Relative: 0.7 % (ref 0.0–3.0)
Eosinophils Absolute: 0.3 10*3/uL (ref 0.0–0.7)
Eosinophils Relative: 2.5 % (ref 0.0–5.0)
HCT: 34.6 % — ABNORMAL LOW (ref 36.0–46.0)
Hemoglobin: 10.9 g/dL — ABNORMAL LOW (ref 12.0–15.0)
Lymphocytes Relative: 21.9 % (ref 12.0–46.0)
Lymphs Abs: 2.6 10*3/uL (ref 0.7–4.0)
MCHC: 31.4 g/dL (ref 30.0–36.0)
MCV: 72 fl — ABNORMAL LOW (ref 78.0–100.0)
Monocytes Absolute: 1.4 10*3/uL — ABNORMAL HIGH (ref 0.1–1.0)
Monocytes Relative: 11.5 % (ref 3.0–12.0)
Neutro Abs: 7.5 10*3/uL (ref 1.4–7.7)
Neutrophils Relative %: 63.4 % (ref 43.0–77.0)
Platelets: 267 10*3/uL (ref 150.0–400.0)
RBC: 4.8 Mil/uL (ref 3.87–5.11)
RDW: 29.1 % — ABNORMAL HIGH (ref 11.5–15.5)
WBC: 11.8 10*3/uL — ABNORMAL HIGH (ref 4.0–10.5)

## 2022-12-28 NOTE — Assessment & Plan Note (Signed)
No desaturations on previous walk test. She still utilizes supplemental oxygen with activity. Advised her to monitor for goal >88-90% and use to maintain above this; otherwise, she does not have to wear it.

## 2022-12-28 NOTE — Assessment & Plan Note (Addendum)
Hospitalized 2022 for pneumonitis secondary to methotrexate. She found out she did not have any down feathered pillows at home. Has since discontinued methotrexate. She was on extended prednisone taper and had significant improvement on imaging and clinically. Unfortunately, she continued to struggle with daily symptoms of DOE and occasional cough. Felt to be multifactorial but given history of RA, repeat HRCT and PFTs were ordered to evaluate for progression. Her HRCT was stable compared to 06/2021 without evidence of worsening. She had a decline in her PFTs; however, was found to have anemia of 6.2 and pulmonary edema, which was likely the cause of this. She is clinically improved today. We will plan to repeat spiro/DLCO in 12 weeks.

## 2022-12-28 NOTE — Progress Notes (Signed)
@Patient  ID: Lynn [name], female    DOB: February 03, 1956, 67 y.o.   MRN: [id number]  Chief Complaint  Patient presents with   Follow-up    Wants to get PFT results.  States breathing is better since steroids.    Referring provider: Copland, Gay Filler, MD  HPI: 67 year old female, former smoker (11 pack history) followed for pneumonitis and DOE. She is a patient of Dr. Golden Pop and last seen in office 12/14/2022 by Belenda Cruise NP. Past medical history significant for rheumatoid arthritis previously on methotrexate, HTN, HFpEF, DM II, chronic anemia thalassemia.   She was last seen in 2022 after being admitted in June to the hospital for acute respiratory failure secondary to pneumonitis, felt to be methotrexate or feather pill hypersenstivity after undergoing bronchoscopy with predominant lymphocytosis greater than 60% on BAL. She was treated with high dose steroids and bactrim for PJP prophylaxis. Imaging improved so she was tapered off by September 2022.  TEST/EVENTS:  01/14/2018 PFT: FVC 77, FEV1 84, ratio 87, TLC 75, DLCO 70.  No BD 06/23/2021 HRCT chest: Atherosclerosis.  Severe calcifications of mitral valve.  Much of the previously noted parenchymal lung changes have resolved.  Continues to be some patchy areas of groundglass attenuation, septal thickening and mild subpleural reticulation scattered throughout the lungs bilaterally.  No discernible craniocaudal gradient.  No traction bronchiectasis or frank honeycombing.  Favored to reflect a mild postinfectious or inflammatory fibrosis, significantly improved compared to prior exam.  Some mild air trapping indicative of mild small airways disease. 11/23/2022 HRCT chest: Mild areas of subpleural reticulation with minimal associated traction bronchiectasis and no clear craniocaudal gradient.  Associated air trapping suggestive of hypersensitivity pneumonitis.  No evidence of progression when compared with previous CT.  Not UIP.  CAD and  atherosclerosis. 12/14/2022 PFT: FVC 42, FEV1 46, TLC 58, DLCOunc 67  06/28/2021: OV with Dr. Chase Caller. She was supposed to be done with prednisone taper but for some reason, still on it. Should complete next week. She was discharged on 4 lpm supplemental oxygen. Continues to use 3 lpm at rest and exertion. Feels improved compared to when she was in the hospital. Still has chest tightness and still has some cough. She did not have any desaturations on her walk test but did become severely symptomatic. Has known moderate to severe MS. Clinically better and CT with significant improvement; perhaps some residual scar tissue. Suggest to no longer use oxygen with rest or only walking short distances. Check ONO on room air. Advised to check with Dr. Ellyn Hack to see if ok to do SOB.  10/25/2022: OV with Amaka Gluth NP for overdue follow up. She was encouraged by her PCP to come back to see [place]. Since she was here last, she was hospitalized in November 2023 for anemia related to GI bleed. Feeling better since discharge but hgb has not entirely normalized so she still has some residual fatigue. As far as her breathing goes, it's better since her hospitalization but she feels it is unchanged compared to when she was here last. She gets winded climbing stairs or with uphill walking. She is able to complete ADLs without difficulties. She has an occasional cough, which is sometimes productive with clear to white sputum. She uses her nebulizer treatments daily, which seem to help her clear sputum and decrease chest congestion. She also notices that her cough gets worse if she has swelling so she makes sure to take her lasix when this happens, which helps. Still uses her oxygen with  activity and at night. She monitors her oxygen levels at home and is never below 92% on room air. Denies any night sweats, fevers, chills, wheezing, leg swelling, orthopnea, PND. She has RA on plaquenil. She is also on Eliquis for PAF; no further episodes of  melena since her hospital stay.   She does have a CPAP, which she wore nightly but it recently broke. She was diagnosed with sleep apnea around 10-12 years ago. Has been on CPAP since. Receives good benefit from use. She's not sure what her settings were set on. She has noticed that since she hasn't been able to use it, she's been waking up more tired and doesn't feel as rested. Denies drowsy driving or morning headaches.   Simple office walk 185 feet x  3 laps goal with forehead probe 06/28/2021    10/25/2022  O2 used ra  ra  Number laps completed Didonly 2 of 3  3/3  Comments about pace slow  slow  Resting Pulse Ox/HR 100% and 77/min  96%, 78/min  Final Pulse Ox/HR 98% and 99/min  96%, 115/min  Desaturated </= 88% no  no  Desaturated <= 3% points no  no  Got Tachycardic >/= 90/min yes  yes  Symptoms at end of test Modreate tos evere shortness of breath  mild SOB  Miscellaneous comments         12/14/2022: OV with Rockey Guarino NP for follow up after HRCT chest and PFTs. Her last set of PFTs were prior to her hospitalization for pneumonitis. She has had a significant decline in her lung function with restrictive defect when compared to 2019. Her DLCO is stable from 70% to 67%; although, this was not corrected for current hgb (last draw 08/2022 with hgb 9.7). Her HRCT was stable with mild changes and no evidence of progression. Findings were suggestive of HP and not UIP pattern. She tells me today that she's noticed for the past month, she's felt a little more short winded with activity and feels like she gets fatigued more easily. She has a cough, which is relatively unchanged from baseline, and productive with clear to white phlegm. Occasionally feels like her chest is congestion, which improves with saline neb. Doesn't notice any wheezing. She denies any fevers, night sweats, hemoptysis, low oxygen levels, leg swelling, orthopnea, palpitations. Albuterol inhaler does help some. She has not missed any doses  of her Eliquis.   She is still using her CPAP machine at night. Humidification doesn't seem to be working properly. She wants a new machine. Does not have her previous sleep study. She doesn't have a SD card in her machine. DME stated that she needs a new sleep study but she has yet to have this scheduled.   SYMPTOM SCALE - ILD 06/28/2021  12/14/2022  Current weight 251#  242  O2 use 3LNC    Shortness of Breath 0 -> 5 scale with 5 being worst (score 6 If unable to do)    At rest 3  3  Simple tasks - showers, clothes change, eating, shaving 4  5  Household (dishes, doing bed, laundry) 4  5  Shopping 4  5  Walking level at own pace 4  4  Walking up Stairs 4  5  Total (30-36) Dyspnea Score 23    How bad is your cough? 3  3  How bad is your fatigue Did not answer  4  How bad is nausea 0  0  How bad is vomiting?  0  0  How bad is diarrhea? 2  0  How bad is anxiety? 4  4  How bad is depression 3  3  Any chronic pain - if so where and how bad Did nto sak     12/28/2022: Today-follow-up Patient presents today for follow-up.  At her last visit, we treated her for possible flare of her chronic HP with prednisone taper.  She had decline in her pulmonary function testing as well.  Given her history of anemia related to GI bleed, CBC was checked and she was actually found to have hemoglobin of 6.2, which was likely the driving factor of her symptoms.  She was advised to go to the emergency department for further evaluation/management, and was admitted from 12/15/2022-12/18/2022.  Gastroenterology was consulted who completed EGD on 3/4 with a nonbleeding gastric ulcer. She was started on PPI and instructed to hold Eliquis until 3/6. She received 3 units PRBC and hgb stabilized to 9.5 upon discharge. She also had some evidence of volume overload on imaging so was treated with IV lasix with improvement.  She tells me today that she is feeling much better. She did end up taking the prednisone, which gave her more  energy. She feels like her breathing is closer to her baseline. Did feel a little more short winded last night but ok today. She denies any bloody stools, cough, wheezing, chest congestion, leg swelling, orthopnea. She is back on her Eliquis. Has not seen her PCP or GI yet. She did have her sleep study a few days ago. She tells me she doesn't think she really slept at all so she didn't think the study worked. She tries to use her current CPAP but is not consistent with it. Wearing her oxygen when out and about. No low O2 levels at home.   Allergies  Allergen Reactions   Methotrexate Other (See Comments)    Pneumonitis   Penicillins Hives    Childhood allergy Has patient had a PCN reaction causing immediate rash, facial/tongue/throat swelling, SOB or lightheadedness with hypotension: Yes Has patient had a PCN reaction causing severe rash involving mucus membranes or skin necrosis: No Has patient had a PCN reaction that required hospitalization No Has patient had a PCN reaction occurring within the last 10 years: No If all of the above answers are "NO", then may proceed with Cephalosporin use.   [name] [Dapagliflozin] Other (See Comments)    Dizzy and lethargic    Metformin And Related Other (See Comments)    Diarrhea, bleeding    Immunization History  Administered Date(s) Administered   Fluad Quad(high Dose 65+) 07/19/2021   Influenza Split 07/22/2012   Influenza, High Dose Seasonal PF 06/10/2019   Influenza,inj,Quad PF,6+ Mos 10/09/2014, 07/13/2015, 11/17/2016, 07/04/2017, 07/10/2018   Influenza-Unspecified 06/20/2020, 07/14/2022   Moderna Sars-Covid-2 Vaccination 11/27/2019, 12/26/2019, 06/22/2020, 12/30/2020, 06/21/2021   Pneumococcal Conjugate-13 03/23/2019   Pneumococcal Polysaccharide-23 08/22/2020   Pneumococcal-Unspecified 02/12/2010   Tdap 02/12/2010, 10/17/2016   Zoster Recombinat (Shingrix) 02/20/2021    Past Medical History:  Diagnosis Date   Anxiety    Clotting  disorder ([place])    blood clot in eye 2016   Coronary artery disease, non-occlusive    a. 11/2016 NSTEMI/Cath: LM nl, LAD 40p, 4md, D1/2 small, LCX 40ost, OM2/3 nl/small, RCA 35 ost/mid, RPDA/RPL small/nl.   Coronary vasospasm ([facility])    Depression    Diastolic dysfunction    a. 11/2016 Echo: EF 55-60%, gr1 DD, sev calcified MV annulus, mildly dil LA.  Eye hemorrhage 01/2015   "right; resolved" (07/03/2017)   Gastric ulcer    Headache    "monthly" (07/03/2017)   History of blood transfusion    "when I had an ectopic pregnancy"   History of stomach ulcers    Hyperlipidemia    Hypertension    Microcytic anemia    Moderate mitral stenosis by prior echocardiogram 03/2021   TTE: Mod MS (mean gradient 9 mmHg). -- TEE Moderate-Severe MS (mean gradient 14 mmHg) with fixed Post MV Leaflet & Severe MAC w/ large circumscribed calcified mass on the Post MV Leaflet. (Mass previously described in 2018) - CMRI identifies calcified mass & not Tumor.; R&LHC - LVEDP & PCWP both 23 mmHg indicating minimal resting gradient   OSA on CPAP    setting is unknown   PAF (paroxysmal atrial fibrillation) ([place]) 03/2021   Event Monitor Aug-Sept 2022: Predominantly sinus rhythm.  1% A. fib burden.  30 brief episodes of PAT.  2 short bursts of NSVT.   PAT (paroxysmal atrial tachycardia)    Event monitor from August 2022 showed 30 brief bursts longest 14 seconds.   Pneumonia    "several times" (07/03/2017)   PVC's (premature ventricular contractions)    Rheumatoid arthritis ([place])    Syncope 07/03/2017 X 2   called seizures but no medications   Thalassemia    "my cells are sickle cell shaped but I don't have sickle cell anemia" (07/03/2017)   Type II diabetes mellitus ([place])     Tobacco History: Social History   Tobacco Use  Smoking Status Former   Packs/day: 0.25   Years: 44.00   Additional pack years: 0.00   Total pack years: 11.00   Types: Cigarettes   Quit date: 02/17/2015   Years since quitting: 7.8   Smokeless Tobacco Never   Counseling given: Not Answered   Outpatient Medications Prior to Visit  Medication Sig Dispense Refill   acetaminophen (TYLENOL) 500 MG tablet Take 500 mg by mouth every 6 (six) hours as needed for fever or moderate pain.     albuterol (PROVENTIL) (2.5 MG/3ML) 0.083% nebulizer solution Take 3 mLs (2.5 mg total) by nebulization every 6 (six) hours as needed for wheezing or shortness of breath. 120 mL 5   albuterol (VENTOLIN HFA) 108 (90 Base) MCG/ACT inhaler Inhale 2 puffs into the lungs every 6 (six) hours as needed for wheezing or shortness of breath. 8 g 2   apixaban (ELIQUIS) 5 MG TABS tablet Take 1 tablet (5 mg total) by mouth 2 (two) times daily. Resume taking on 12/19/22 180 tablet 3   atorvastatin (LIPITOR) 80 MG tablet Take 1 tablet (80 mg total) by mouth every evening. 90 tablet 3   cetirizine (ZYRTEC) 10 MG tablet Take 10 mg by mouth daily as needed for allergies.     Continuous Blood Gluc Sensor (DEXCOM G6 SENSOR) MISC 1 Device by Does not apply route as directed. 3 each 6   Continuous Blood Gluc Transmit (DEXCOM G6 TRANSMITTER) MISC 1 Device by Does not apply route as directed. 1 each 3   diltiazem (CARDIZEM CD) 360 MG 24 hr capsule Take 1 capsule (360 mg total) by mouth daily. 90 capsule 3   folic acid (FOLVITE) 1 MG tablet Take 2 tablets (2 mg total) by mouth daily. (Patient taking differently: Take 1 mg by mouth in the morning and at bedtime.) 180 tablet 3   furosemide (LASIX) 20 MG tablet Decrease Lasix ( furosemide)  to 20 mg ( 1/2/tablet)  ,  with 20 mg  you may take a an additional 20 mg if need for swelling or weight gain more than 3 lbs overnight or 5 lbs in one week -- if you have continue to take an additional 20 mg , go back to 40 mg  Take additional dose as needed for egt gain> 3 lb in 1 day 30 tablet 0   glucose blood (CONTOUR NEXT TEST) test strip 3x daily 300 each 11   hydroxychloroquine (PLAQUENIL) 200 MG tablet Take 200 mg by mouth 2 (two)  times daily.     Insulin Disposable Pump (OMNIPOD 5 G6 INTRO, GEN 5,) KIT 1 Device by Does not apply route every other day. 1 kit 0   Insulin Disposable Pump (OMNIPOD 5 G6 POD, GEN 5,) MISC 1 Device by Does not apply route every other day. 30 each 3   insulin lispro (HUMALOG) 100 UNIT/ML injection Max Daily 100 units per pump (Patient taking differently: Max Daily 100 units per pump per patient) 90 mL 3   Insulin Pen Needle (B-D UF III MINI PEN NEEDLES) 31G X 5 MM MISC USE DAILY WITH VICTOZA AND LANTUS. (Patient taking differently: Use for ozempic) 400 each 1   Insulin Pen Needle 31G X 5 MM MISC 1 Device by Does not apply route 3 (three) times daily. 150 each 11   isosorbide mononitrate (IMDUR) 30 MG 24 hr tablet Take 1 tablet (30 mg total) by mouth daily. 90 tablet 3   ketoconazole (NIZORAL) 2 % cream Apply 1 Application topically daily. 60 g 2   Lancets 30G MISC 1 Device by Does not apply route 3 (three) times daily. 300 each 9   leflunomide (ARAVA) 20 MG tablet Take 20 mg by mouth daily.     losartan (COZAAR) 100 MG tablet Take 0.5 tablets (50 mg total) by mouth daily. 90 tablet 3   Melatonin 5 MG CHEW Chew 5 mg by mouth at bedtime.     Multiple Vitamin (MULTIVITAMIN WITH MINERALS) TABS tablet Take 1 tablet by mouth daily.     Oxymetazoline HCl (VICKS SINEX NA) Place 1 spray into the nose daily as needed (congestion).     pantoprazole (PROTONIX) 40 MG tablet Take 1 tablet (40 mg total) by mouth 2 (two) times daily before a meal. 60 tablet 0   SAFETY-LOK TB SYRINGE 27GX.5" 27G X 1/2" 1 ML MISC      tirzepatide (MOUNJARO) 7.5 MG/0.5ML Pen Inject 7.5 mg into the skin once a week. 6 mL 3   traZODone (DESYREL) 50 MG tablet Take 0.5-1 tablets (25-50 mg total) by mouth at bedtime as needed. for sleep (Patient taking differently: Take 25-50 mg by mouth at bedtime as needed for sleep.) 90 tablet 2   predniSONE (DELTASONE) 10 MG tablet 4 tabs for 2 days, then 3 tabs for 2 days, 2 tabs for 2 days, then 1  tab for 2 days, then stop (Patient not taking: Reported on 12/28/2022) 20 tablet 0   No facility-administered medications prior to visit.     Review of Systems:   Constitutional: No weight loss or gain, night sweats, fevers, chills, or lassitude. +fatigue (improving) HEENT: No headaches, difficulty swallowing, tooth/dental problems, or sore throat. No sneezing, itching, ear ache, nasal congestion, or post nasal drip CV:  No chest pain, orthopnea, PND, swelling in lower extremities, anasarca, dizziness, palpitations, syncope Resp: +shortness of breath with exertion; snoring without CPAP. No excess mucus or change in color of mucus. No hemoptysis. No wheezing.  No  chest wall deformity GI:  No heartburn, indigestion, abdominal pain, nausea, vomiting, diarrhea, change in bowel habits, loss of appetite, bloody stools.  GU: No dysuria, change in color of urine, urgency or frequency.  Skin: No rash, lesions, ulcerations MSK:  No increased joint pain or swelling.   Neuro: No dizziness or lightheadedness.  Psych: No depression or anxiety. Mood stable.     Physical Exam:  BP 110/60 (BP Location: Right Arm, Patient Position: Sitting, Cuff Size: Large)   Pulse 97   Temp 98.5 F (36.9 C) (Oral)   Ht 5\' 3"  (1.6 m)   Wt 234 lb 12.8 oz (106.5 kg)   LMP  (LMP Unknown)   SpO2 96%   BMI 41.59 kg/m   GEN: Pleasant, interactive, well-kempt; obese; in no acute distress. HEENT:  Normocephalic and atraumatic. PERRLA. Sclera white. Nasal turbinates pink, moist and patent bilaterally. No rhinorrhea present. Oropharynx pink and moist, without exudate or edema. No lesions, ulcerations, or postnasal drip. Mallampati III NECK:  Supple w/ fair ROM. No JVD present. Normal carotid impulses w/o bruits. Thyroid symmetrical with no goiter or nodules palpated. No lymphadenopathy.   CV: RRR, no m/r/g, no peripheral edema. Pulses intact, +2 bilaterally. No cyanosis, pallor or clubbing. PULMONARY:  Unlabored, regular  breathing. Clear bilaterally A&P w/o wheezes/rales/rhonchi. No accessory muscle use.  GI: BS present and normoactive. Soft, non-tender to palpation. No organomegaly or masses detected.  MSK: No erythema, warmth or tenderness. Cap refil <2 sec all extrem. No deformities or joint swelling noted.  Neuro: A/Ox3. No focal deficits noted.   Skin: Warm, no lesions or rashe Psych: Normal affect and behavior. Judgement and thought content appropriate.     Lab Results:  CBC    Component Value Date/Time   WBC 8.5 12/18/2022 0516   RBC 4.09 12/18/2022 0516   HGB 9.5 (L) 12/18/2022 0516   HGB 8.9 (L) 06/07/2022 1401   HGB 9.3 (L) 07/31/2021 1519   HGB 9.0 (L) 02/26/2017 1501   HCT 31.3 (L) 12/18/2022 0516   HCT 30.5 (L) 07/31/2021 1519   HCT 29.1 (L) 02/26/2017 1501   PLT 243 12/18/2022 0516   PLT 250 06/07/2022 1401   PLT 346 07/31/2021 1519   MCV 76.5 (L) 12/18/2022 0516   MCV 62 (L) 07/31/2021 1519   MCV 61 (L) 02/26/2017 1501   MCH 23.2 (L) 12/18/2022 0516   MCHC 30.4 12/18/2022 0516   RDW 26.3 (H) 12/18/2022 0516   RDW 15.6 (H) 07/31/2021 1519   RDW 17.1 (H) 02/26/2017 1501   LYMPHSABS 1.4 12/18/2022 0516   LYMPHSABS 2.5 02/26/2017 1501   MONOABS 1.1 (H) 12/18/2022 0516   EOSABS 0.4 12/18/2022 0516   EOSABS 0.2 02/26/2017 1501   BASOSABS 0.1 12/18/2022 0516   BASOSABS 0.0 02/26/2017 1501    BMET    Component Value Date/Time   NA 137 12/18/2022 0516   NA 143 09/04/2022 0000   K 3.6 12/18/2022 0516   CL 98 12/18/2022 0516   CO2 28 12/18/2022 0516   GLUCOSE 143 (H) 12/18/2022 0516   BUN 13 12/18/2022 0516   BUN 12 09/04/2022 0000   CREATININE 1.03 (H) 12/18/2022 0516   CREATININE 1.14 (H) 04/09/2022 1250   CREATININE 0.95 01/03/2016 1118   CALCIUM 8.9 12/18/2022 0516   GFRNONAA 59 (L) 12/18/2022 0516   GFRNONAA 52 (L) 04/09/2022 1250   GFRNONAA 65 01/03/2016 1118   GFRAA 56 08/25/2020 0000   GFRAA 75 01/03/2016 1118    BNP  Component Value Date/Time   BNP  103.4 (H) 09/04/2022 2224     Imaging:  DG CHEST PORT 1 VIEW  Result Date: 12/18/2022 CLINICAL DATA:  Difficulty breathing, fluid overload EXAM: PORTABLE CHEST 1 VIEW COMPARISON:  Previous studies including the examination of 12/16/2022 FINDINGS: Transverse diameter of heart is slightly increased. There is interval decrease in pulmonary vascular congestion and improvement in aeration in both lungs. There are no signs of pulmonary edema or new focal infiltrates. There is blunting of both lateral CP angles. There is no pneumothorax. There is soft tissue calcification adjacent to the proximal right humerus, possibly suggesting calcific bursitis. IMPRESSION: There is interval decrease in pulmonary vascular congestion and clearing of pulmonary edema. There are no signs of pulmonary edema or new focal infiltrates. Small bilateral pleural effusions are seen. Electronically Signed   By: Elmer Picker M.D.   On: 12/18/2022 08:05   DG CHEST PORT 1 VIEW  Result Date: 12/16/2022 CLINICAL DATA:  Shortness of breath. EXAM: PORTABLE CHEST 1 VIEW COMPARISON:  X-ray 09/04/2022.  Recent CT scan 11/23/2022 FINDINGS: Underinflation. Under penetrated x-ray. There is new parenchymal opacity along the right mid to upper lung. Acute infiltrates possible such as pneumonia. Recommend follow-up. The heart enlarged. Significant calcification of the mitral valve annulus. Slight central vascular congestion. No pneumothorax. Overlapping cardiac leads. IMPRESSION: New ill-defined parenchymal opacity right mid upper lung. Acute process such as pneumonia is possible. Recommend follow-up from clearance. Heart is enlarged with a additional central vascular congestion. Under penetrated and underinflated x-ray Electronically Signed   By: Jill Side M.D.   On: 12/16/2022 15:58   ECHOCARDIOGRAM COMPLETE  Result Date: 11/30/2022    ECHOCARDIOGRAM REPORT   Patient Name:   KAMINA KORTH Date of Exam: 11/30/2022 Medical Rec #:   [id number]               Height:       63.0 in Accession #:    [id number]              Weight:       241.2 lb Date of Birth:  Dec 06, 1955                BSA:          2.094 m Patient Age:    64 years                BP:           132/72 mmHg Patient Gender: F                       HR:           90 bpm. Exam Location:  [facility] Procedure: 2D Echo, Cardiac Doppler, Color Doppler and Intracardiac            Opacification Agent Indications:    I34.2 Nonrheumatic mitral (valve) stenosis  History:        Patient has prior history of Echocardiogram examinations, most                 recent 04/26/2022. CAD, Arrythmias:Atrial Fibrillation and PVC,                 Signs/Symptoms:Syncope; Risk Factors:Hypertension, Diabetes,                 Dyslipidemia, Sleep Apnea and Former Smoker. Paroxysmal atrial  tachycardia. Morbid obesity. Dyspnea on exertion. Interstitial                 lung disease.  Sonographer:    Diamond Nickel RCS Referring Phys: [place]  1. Left ventricular ejection fraction, by estimation, is 60 to 65%. The left ventricle has normal function. The left ventricle has no regional wall motion abnormalities. There is mild concentric left ventricular hypertrophy. Left ventricular diastolic parameters are consistent with Grade I diastolic dysfunction (impaired relaxation).  2. Right ventricular systolic function is normal. The right ventricular size is normal.  3. Left atrial size was moderately dilated.  4. Large calcified mass at posterior mitral annulus is likely exuberant mitral annular calcification. The mitral valve is degenerative. No evidence of mitral valve regurgitation. Moderate mitral stenosis. The mean mitral valve gradient is 12.0 mmHg, MVA  1.5 cm^2. Severe mitral annular calcification.  5. The aortic valve is tricuspid. There is moderate calcification of the aortic valve. Aortic valve regurgitation is not visualized. Aortic valve sclerosis/calcification is  present, without any evidence of aortic stenosis.  6. The inferior vena cava is normal in size with greater than 50% respiratory variability, suggesting right atrial pressure of 3 mmHg. FINDINGS  Left Ventricle: Left ventricular ejection fraction, by estimation, is 60 to 65%. The left ventricle has normal function. The left ventricle has no regional wall motion abnormalities. The left ventricular internal cavity size was normal in size. There is  mild concentric left ventricular hypertrophy. Left ventricular diastolic parameters are consistent with Grade I diastolic dysfunction (impaired relaxation). Right Ventricle: The right ventricular size is normal. No increase in right ventricular wall thickness. Right ventricular systolic function is normal. Left Atrium: Left atrial size was moderately dilated. Right Atrium: Right atrial size was normal in size. Pericardium: There is no evidence of pericardial effusion. Mitral Valve: Large calcified mass at posterior mitral annulus is likely exuberant mitral annular calcification. The mitral valve is degenerative in appearance. There is moderate calcification of the mitral valve leaflet(s). Severe mitral annular calcification. No evidence of mitral valve regurgitation. Moderate mitral valve stenosis. MV peak gradient, 22.6 mmHg. The mean mitral valve gradient is 12.0 mmHg. Tricuspid Valve: The tricuspid valve is normal in structure. Tricuspid valve regurgitation is trivial. Aortic Valve: The aortic valve is tricuspid. There is moderate calcification of the aortic valve. Aortic valve regurgitation is not visualized. Aortic valve sclerosis/calcification is present, without any evidence of aortic stenosis. Pulmonic Valve: The pulmonic valve was normal in structure. Pulmonic valve regurgitation is not visualized. Aorta: The aortic root is normal in size and structure. Venous: The inferior vena cava is normal in size with greater than 50% respiratory variability, suggesting right  atrial pressure of 3 mmHg. IAS/Shunts: No atrial level shunt detected by color flow Doppler.  LEFT VENTRICLE PLAX 2D LVIDd:         3.80 cm   Diastology LVIDs:         2.20 cm   LV e' medial:    9.29 cm/s LV PW:         1.30 cm   LV E/e' medial:  17.8 LV IVS:        1.10 cm   LV e' lateral:   7.14 cm/s LVOT diam:     2.00 cm   LV E/e' lateral: 23.1 LV SV:         94 LV SV Index:   45 LVOT Area:     3.14 cm  RIGHT VENTRICLE RV  S prime:     16.60 cm/s TAPSE (M-mode): 3.0 cm LEFT ATRIUM             Index        RIGHT ATRIUM           Index LA diam:        3.40 cm 1.62 cm/m   RA Area:     11.20 cm LA Vol (A2C):   55.1 ml 26.31 ml/m  RA Volume:   21.30 ml  10.17 ml/m LA Vol (A4C):   95.4 ml 45.56 ml/m LA Biplane Vol: 73.5 ml 35.10 ml/m  AORTIC VALVE LVOT Vmax:   135.67 cm/s LVOT Vmean:  95.933 cm/s LVOT VTI:    0.300 m  AORTA Ao Root diam: 3.10 cm Ao Asc diam:  2.70 cm MITRAL VALVE MV Area (PHT): 2.65 cm     SHUNTS MV Area VTI:   1.35 cm     Systemic VTI:  0.30 m MV Peak grad:  22.6 mmHg    Systemic Diam: 2.00 cm MV Mean grad:  12.0 mmHg MV Vmax:       2.38 m/s MV Vmean:      166.0 cm/s MV Decel Time: 287 msec MV E velocity: 165.00 cm/s MV A velocity: 200.67 cm/s MV E/A ratio:  0.82 Dalton McleanMD Electronically signed by Franki Monte Signature Date/Time: 11/30/2022/2:50:56 PM    Final     perflutren lipid microspheres (DEFINITY) IV suspension     Date Action Dose Route User   Discharged on 12/18/2022   Admitted on 12/15/2022   11/30/2022 1229 Given 2 mL Intravenous (Left Arm) Valinda Hoar, RCS          Latest Ref Rng & Units 12/14/2022   10:02 AM 01/14/2018   10:59 AM  PFT Results  FVC-Pre L 1.21  P 1.85   FVC-Predicted Pre % 42  P 77   FVC-Post L  1.87   FVC-Predicted Post %  78   Pre FEV1/FVC % % 84  P 85   Post FEV1/FCV % %  87   FEV1-Pre L 1.02  P 1.57   FEV1-Predicted Pre % 46  P 84   FEV1-Post L  1.62   DLCO uncorrected ml/min/mmHg 12.49  P 15.77   DLCO UNC% % 67  P 70   DLCO  corrected ml/min/mmHg 12.49  P   DLCO COR %Predicted % 67  P   DLVA Predicted % 102  P 101   TLC L 2.81  P 3.66   TLC % Predicted % 58  P 75   RV % Predicted % -307  P 81     P Preliminary result    No results found for: "NITRICOXIDE"      Assessment & Plan:   IDA (iron deficiency anemia) IDA secondary to GI bleed. Hgb stabilized to 9.5 after 3 PRBCs. She has not had this rechecked since being discharged and restarting Eliquis. We will redraw CBC today. We discussed that I suspect her symptoms, and possibly her decline in PFTs, were related to her severe anemia. She is clinically improved today. She has She has f/u appt with GI on Monday, 3/18.   Patient Instructions  Continue Albuterol inhaler 2 puffs or 3 mL neb every 6 hours as needed for shortness of breath or wheezing. Notify if symptoms persist despite rescue inhaler/neb use.  Continue zytrec 1 tab daily Continue pantoprazole 40 mg daily  Continue supplemental oxygen 2 lpm as needed to maintain  oxygen >88-90% Continue CPAP at night. We will see what your in lab sleep test showed and consider repeating, if needed  Recheck CBC today to make sure your blood counts are stable Make sure you see GI Monday - 3/18 at 10:10 am  Repeat pulmonary function testing in 12 weeks    Follow up in 12 weeks after spirometry/DLCO with Dr. Chase Caller (1st) or [name] Lakindra Wible,NP. If symptoms do not improve or worsen, please contact office for sooner follow up or seek emergency care.    ILD (interstitial lung disease) ([place]) Hospitalized 2022 for pneumonitis secondary to methotrexate. She found out she did not have any down feathered pillows at home. Has since discontinued methotrexate. She was on extended prednisone taper and had significant improvement on imaging and clinically. Unfortunately, she continued to struggle with daily symptoms of DOE and occasional cough. Felt to be multifactorial but given history of RA, repeat HRCT and PFTs were ordered  to evaluate for progression. Her HRCT was stable compared to 06/2021 without evidence of worsening. She had a decline in her PFTs; however, was found to have anemia of 6.2 and pulmonary edema, which was likely the cause of this. She is clinically improved today. We will plan to repeat spiro/DLCO in 12 weeks.    Chronic respiratory failure with hypoxia (HCC) No desaturations on previous walk test. She still utilizes supplemental oxygen with activity. Advised her to monitor for goal >88-90% and use to maintain above this; otherwise, she does not have to wear it.   OSA on CPAP Awaiting sleep study results. Will discuss next steps once received. Cautioned on safe driving practices.   Chronic diastolic CHF (congestive heart failure) (HCC) Euvolemic on exam. She will continue diuretic regimen and f/u with cardiology.     I spent 35 minutes of dedicated to the care of this patient on the date of this encounter to include pre-visit review of records, face-to-face time with the patient discussing conditions above, post visit ordering of testing, clinical documentation with the electronic health record, making appropriate referrals as documented, and communicating necessary findings to members of the patients care team.  Clayton Bibles, NP 12/28/2022  Pt aware and understands NP's role.

## 2022-12-28 NOTE — Assessment & Plan Note (Signed)
Euvolemic on exam. She will continue diuretic regimen and f/u with cardiology.

## 2022-12-28 NOTE — Patient Instructions (Addendum)
Continue Albuterol inhaler 2 puffs or 3 mL neb every 6 hours as needed for shortness of breath or wheezing. Notify if symptoms persist despite rescue inhaler/neb use.  Continue zytrec 1 tab daily Continue pantoprazole 40 mg daily  Continue supplemental oxygen 2 lpm as needed to maintain oxygen >88-90% Continue CPAP at night. We will see what your in lab sleep test showed and consider repeating, if needed  Recheck CBC today to make sure your blood counts are stable Make sure you see GI Monday - 3/18 at 10:10 am  Repeat pulmonary function testing in 12 weeks    Follow up in 12 weeks after spirometry/DLCO with Dr. Chase Caller (1st) or [name] Reese Senk,NP. If symptoms do not improve or worsen, please contact office for sooner follow up or seek emergency care.

## 2022-12-28 NOTE — Assessment & Plan Note (Signed)
IDA secondary to GI bleed. Hgb stabilized to 9.5 after 3 PRBCs. She has not had this rechecked since being discharged and restarting Eliquis. We will redraw CBC today. We discussed that I suspect her symptoms, and possibly her decline in PFTs, were related to her severe anemia. She is clinically improved today. She has She has f/u appt with GI on Monday, 3/18.   Patient Instructions  Continue Albuterol inhaler 2 puffs or 3 mL neb every 6 hours as needed for shortness of breath or wheezing. Notify if symptoms persist despite rescue inhaler/neb use.  Continue zytrec 1 tab daily Continue pantoprazole 40 mg daily  Continue supplemental oxygen 2 lpm as needed to maintain oxygen >88-90% Continue CPAP at night. We will see what your in lab sleep test showed and consider repeating, if needed  Recheck CBC today to make sure your blood counts are stable Make sure you see GI Monday - 3/18 at 10:10 am  Repeat pulmonary function testing in 12 weeks    Follow up in 12 weeks after spirometry/DLCO with Dr. Chase Caller (1st) or [name] Kelcie Currie,NP. If symptoms do not improve or worsen, please contact office for sooner follow up or seek emergency care.

## 2022-12-28 NOTE — Assessment & Plan Note (Signed)
Awaiting sleep study results. Will discuss next steps once received. Cautioned on safe driving practices.

## 2022-12-30 DIAGNOSIS — G4733 Obstructive sleep apnea (adult) (pediatric): Secondary | ICD-10-CM | POA: Diagnosis not present

## 2022-12-30 NOTE — Procedures (Signed)
      Patient Name: Lynn, [name] Date: 12/26/2022 Gender: Female D.O.B: 03-09-1956 Age (years): 10 Referring Provider: Clayton Bibles NP Height (inches): 59 Interpreting Physician: Chesley Mires MD, ABSM Weight (lbs): 234.40 RPSGT: Laren Everts BMI: 42 MRN: [id number] Neck Size: 17.00  CLINICAL INFORMATION Sleep Study Type: NPSG  Indication for sleep study: Diabetes, Fatigue, Morning Headaches, Obesity, OSA, Re-Evaluation, Snoring  Epworth Sleepiness Score: 12  SLEEP STUDY TECHNIQUE As per the AASM Manual for the Scoring of Sleep and Associated Events v2.3 (April 2016) with a hypopnea requiring 4% desaturations.  The channels recorded and monitored were frontal, central and occipital EEG, electrooculogram (EOG), submentalis EMG (chin), nasal and oral airflow, thoracic and abdominal wall motion, anterior tibialis EMG, snore microphone, electrocardiogram, and pulse oximetry.  MEDICATIONS Medications self-administered by patient taken the night of the study : FOLIC ACID, HYDROXYCHLOROQUINE, LISPRO, Melatonin Gummy, PANTOPRAZOLE SODIUM, Vicks Sinex Nasal Spray  SLEEP ARCHITECTURE The study was initiated at 10:25:10 PM and ended at 4:48:12 AM.  Sleep onset time was 67.8 minutes and the sleep efficiency was 8.7%. The total sleep time was 33.5 minutes.  Stage REM latency was N/A minutes.  The patient spent 56.7% of the night in stage N1 sleep, 43.3% in stage N2 sleep, 0.0% in stage N3 and 0% in REM.  Alpha intrusion was absent.  Supine sleep was 2.99%.  RESPIRATORY PARAMETERS The overall apnea/hypopnea index (AHI) was 10.7 per hour. There were 0 total apneas, including 0 obstructive, 0 central and 0 mixed apneas. There were 6 hypopneas and 20 RERAs.  The AHI during Stage REM sleep was N/A per hour.  AHI while supine was 0.0 per hour.  The mean oxygen saturation was 92.2%. The minimum SpO2 during sleep was 89.0%.  Soft snoring was noted during this  study.  CARDIAC DATA The 2 lead EKG demonstrated sinus rhythm. The mean heart rate was 64.9 beats per minute. Other EKG findings include: None.  LEG MOVEMENT DATA The total PLMS were 0 with a resulting PLMS index of 0.0. Associated arousal with leg movement index was 3.6 .  IMPRESSIONS - She had insufficient sleep time to make an accurate assessment regarding the presence of obstructive sleep apnea.  She only had 33.5 minutes of sleep time during this study. - Supplemental oxygen was not used during this study.  DIAGNOSIS - Snoring.  RECOMMENDATIONS - If there is still significant concern for obstructive sleep apnea, then she can be scheduled for a repeat in-lab nocturnal polysomnogram with the use of a sleep aide medication on the night of the study or arrange for a home sleep study. - Avoid alcohol, sedatives and other CNS depressants that may worsen sleep apnea and disrupt normal sleep architecture. - Sleep hygiene should be reviewed to assess factors that may improve sleep quality. - Weight management and regular exercise should be initiated or continued if appropriate.  [Electronically signed] 12/30/2022 12:12 PM  Chesley Mires MD, [place], American Board of Sleep Medicine NPI: [id number]  [place] PH: [phone]   FX: [phone] [place]

## 2022-12-31 ENCOUNTER — Ambulatory Visit: Payer: Medicare Other | Admitting: Gastroenterology

## 2022-12-31 ENCOUNTER — Encounter: Payer: Self-pay | Admitting: Gastroenterology

## 2022-12-31 VITALS — BP 114/76 | HR 86 | Ht 63.0 in | Wt 236.0 lb

## 2022-12-31 DIAGNOSIS — K250 Acute gastric ulcer with hemorrhage: Secondary | ICD-10-CM

## 2022-12-31 MED ORDER — PANTOPRAZOLE SODIUM 40 MG PO TBEC: 40.0000 mg | DELAYED_RELEASE_TABLET | Freq: Every day | ORAL | 11 refills | Status: DC

## 2022-12-31 NOTE — Progress Notes (Signed)
Assessment     Gastric ulcer with bleed, H pylori negative History of gastric AVMs Thalassemia trait Afib on Eliquis COPD on O2   Recommendations    Change pantoprazole to 40 mg daily long term Avoid NSAIDs long-term 3.   Screening colonoscopy Nov 2027    HPI    This is a 67 year old female who was recently hospitalized for melena and a severe anemia.  Hemoglobin on March 15 was 10.9.  She has no gastrointestinal complaints.  She feels her energy level is normal.  She denies hematochezia and melena.   EGD Mar 2024  Path: H pylori negative  Labs / Imaging       Latest Ref Rng & Units 12/15/2022    8:06 AM 09/04/2022    3:14 PM 09/04/2022   12:00 AM  Hepatic Function  Total Protein 6.5 - 8.1 g/dL 7.3  7.9    Albumin 3.5 - 5.0 g/dL 3.6  4.0  4.5      AST 15 - 41 U/L 19  24    ALT 0 - 44 U/L 16  19    Alk Phosphatase 38 - 126 U/L 73  78    Total Bilirubin 0.3 - 1.2 mg/dL 0.6  0.4       This result is from an external source.       Latest Ref Rng & Units 12/28/2022   11:08 AM 12/18/2022    5:16 AM 12/17/2022    4:38 AM  CBC  WBC 4.0 - 10.5 K/uL 11.8  8.5  8.7   Hemoglobin 12.0 - 15.0 g/dL 10.9  9.5  9.4   Hematocrit 36.0 - 46.0 % 34.6  31.3  31.6   Platelets 150.0 - 400.0 K/uL 267.0  243  243      SLEEP STUDY DOCUMENTS  Result Date: 12/28/2022 Ordered by an unspecified provider.  DG CHEST PORT 1 VIEW  Result Date: 12/18/2022 CLINICAL DATA:  Difficulty breathing, fluid overload EXAM: PORTABLE CHEST 1 VIEW COMPARISON:  Previous studies including the examination of 12/16/2022 FINDINGS: Transverse diameter of heart is slightly increased. There is interval decrease in pulmonary vascular congestion and improvement in aeration in both lungs. There are no signs of pulmonary edema or new focal infiltrates. There is blunting of both lateral CP angles. There is no pneumothorax. There is soft tissue calcification adjacent to the proximal right humerus, possibly suggesting  calcific bursitis. IMPRESSION: There is interval decrease in pulmonary vascular congestion and clearing of pulmonary edema. There are no signs of pulmonary edema or new focal infiltrates. Small bilateral pleural effusions are seen. Electronically Signed   By: Elmer Picker M.D.   On: 12/18/2022 08:05   DG CHEST PORT 1 VIEW  Result Date: 12/16/2022 CLINICAL DATA:  Shortness of breath. EXAM: PORTABLE CHEST 1 VIEW COMPARISON:  X-ray 09/04/2022.  Recent CT scan 11/23/2022 FINDINGS: Underinflation. Under penetrated x-ray. There is new parenchymal opacity along the right mid to upper lung. Acute infiltrates possible such as pneumonia. Recommend follow-up. The heart enlarged. Significant calcification of the mitral valve annulus. Slight central vascular congestion. No pneumothorax. Overlapping cardiac leads. IMPRESSION: New ill-defined parenchymal opacity right mid upper lung. Acute process such as pneumonia is possible. Recommend follow-up from clearance. Heart is enlarged with a additional central vascular congestion. Under penetrated and underinflated x-ray Electronically Signed   By: Jill Side M.D.   On: 12/16/2022 15:58    Current Medications, Allergies, Past Medical History, Past Surgical History, Family History and  Social History were reviewed in [employer] record.   Physical Exam: General: Well developed, well nourished, no acute distress Head: Normocephalic and atraumatic Eyes: Sclerae anicteric, EOMI Ears: Normal auditory acuity Mouth: No deformities or lesions noted Lungs: Clear throughout to auscultation Heart: Regular rate and rhythm; No murmurs, rubs or bruits Abdomen: Soft, non tender and non distended. No masses, hepatosplenomegaly or hernias noted. Normal Bowel sounds Rectal: Not done Musculoskeletal: Symmetrical with no gross deformities  Pulses:  Normal pulses noted Extremities: No edema or deformities noted Neurological: Alert oriented x 4, grossly  nonfocal Psychological:  Alert and cooperative. Normal mood and affect   Margarite Vessel T. Fuller Plan, MD 12/31/2022, 10:14 AM

## 2022-12-31 NOTE — Patient Instructions (Addendum)
We have sent the following medications to your pharmacy for you to pick up at your convenience: Pantoprazole: 1x daily    Please follow up in 1 year with [place]! _______________________________________________________  If your blood pressure at your visit was 140/90 or greater, please contact your primary care physician to follow up on this.  _______________________________________________________  If you are age 69 or older, your body mass index should be between 23-30. Your Body mass index is 41.81 kg/m. If this is out of the aforementioned range listed, please consider follow up with your Primary Care Provider.  If you are age 44 or younger, your body mass index should be between 19-25. Your Body mass index is 41.81 kg/m. If this is out of the aformentioned range listed, please consider follow up with your Primary Care Provider.   ________________________________________________________  [facility] providers would like to encourage you to use [facility] to communicate with providers for non-urgent requests or questions.  Due to long hold times on the telephone, sending your provider a message by [facility] may be a faster and more efficient way to get a response.  Please allow 48 business hours for a response.  Please remember that this is for non-urgent requests.  _______________________________________________________ It was a pleasure to see you today!  Thank you for trusting me with your gastrointestinal care!

## 2023-01-14 DIAGNOSIS — E1165 Type 2 diabetes mellitus with hyperglycemia: Secondary | ICD-10-CM | POA: Diagnosis not present

## 2023-01-14 DIAGNOSIS — Z794 Long term (current) use of insulin: Secondary | ICD-10-CM | POA: Diagnosis not present

## 2023-01-24 ENCOUNTER — Encounter: Payer: Self-pay | Admitting: Cardiology

## 2023-01-24 MED ORDER — DILTIAZEM HCL ER COATED BEADS 360 MG PO CP24: 360.0000 mg | ORAL_CAPSULE | Freq: Every day | ORAL | 1 refills | Status: DC

## 2023-01-24 MED ORDER — FUROSEMIDE 20 MG PO TABS: ORAL_TABLET | ORAL | 0 refills | Status: DC

## 2023-02-11 ENCOUNTER — Ambulatory Visit: Payer: Medicare Other | Admitting: Podiatry

## 2023-02-22 ENCOUNTER — Other Ambulatory Visit: Payer: Self-pay | Admitting: Nurse Practitioner

## 2023-02-22 ENCOUNTER — Other Ambulatory Visit: Payer: Self-pay | Admitting: Cardiology

## 2023-03-20 ENCOUNTER — Other Ambulatory Visit: Payer: Self-pay | Admitting: Cardiology

## 2023-04-04 DIAGNOSIS — E1165 Type 2 diabetes mellitus with hyperglycemia: Secondary | ICD-10-CM | POA: Diagnosis not present

## 2023-04-22 ENCOUNTER — Ambulatory Visit: Payer: Medicare Other | Admitting: Internal Medicine

## 2023-04-22 ENCOUNTER — Encounter: Payer: Self-pay | Admitting: Internal Medicine

## 2023-04-22 VITALS — BP 134/82 | HR 85 | Ht 63.0 in | Wt 234.0 lb

## 2023-04-22 DIAGNOSIS — Z794 Long term (current) use of insulin: Secondary | ICD-10-CM | POA: Diagnosis not present

## 2023-04-22 DIAGNOSIS — E1139 Type 2 diabetes mellitus with other diabetic ophthalmic complication: Secondary | ICD-10-CM | POA: Diagnosis not present

## 2023-04-22 LAB — POCT GLYCOSYLATED HEMOGLOBIN (HGB A1C): Hemoglobin A1C: 6.8 % — AB (ref 4.0–5.6)

## 2023-04-22 MED ORDER — OMNIPOD 5 G7 PODS (GEN 5) MISC: 1.0000 | 3 refills | Status: DC

## 2023-04-22 MED ORDER — OMNIPOD 5 G7 INTRO (GEN 5) KIT: 1.0000 | PACK | 0 refills | Status: DC

## 2023-04-22 MED ORDER — TIRZEPATIDE 10 MG/0.5ML ~~LOC~~ SOAJ: 10.0000 mg | SUBCUTANEOUS | 3 refills | Status: DC

## 2023-04-22 MED ORDER — DEXCOM G7 SENSOR MISC: 1.0000 | 3 refills | Status: DC

## 2023-04-22 NOTE — Progress Notes (Signed)
Name: Lynn [name]  Age/ Sex: 67 y.o., female   MRN/ DOB: [id number], 1956-04-01     PCP: Pearline Cables, MD   Reason for Endocrinology Evaluation: Type 2 Diabetes Mellitus  Initial Endocrine Consultative Visit: 03/31/2019    PATIENT IDENTIFIER: Lynn [name] is a 67 y.o. female with a past medical history of T2DM, HTN, dyslipidemia,thalassemia, OSA on CPAP . The patient has followed with Endocrinology clinic since 03/31/2019 for consultative assistance with management of her diabetes.  DIABETIC HISTORY:  Lynn [name] was diagnosed with T2DM in 2010, She has been on metformin in the past with reported prior intolerance due to diarrhea. Was on Glipizide at some point with no side effects.Has been on insulin for years. Her hemoglobin A1c has ranged from  7.7% in 2018, peaking at 10.4% in 2020.  On her initial presentation to our clinic, her A1c was 10.4% , she was on Lantus and Victoza    Marcelline Deist was tried in 03/2019 but developed severe dizziness .     Injectable methotrexate was started November 2020 due to rheumatoid arthritis.  Patient is intolerant to oral methotrexate   Started on Omnipod 10/2020  Switched Ozempic to [facility] 08/2022 due to shortage of supply   SUBJECTIVE:   During the last visit (10/12/2022): A1c 7.9 %      Today (04/22/2023): Lynn [name] is here for a month follow up on diabetes management. She has the Omnipod. But was recently discharged from the hospital and this disrupted the pump use.  She continues to follow-up with GI for gastric ulcer  She had an ED visit for anemia secondary to melena 12/2022  Denies nausea or vomiting  Denies constipation or diarrhea    This patient with type 2 diabetes is treated with Omnipod (insulin pump). During the visit the pump basal and bolus doses were reviewed including carb/insulin rations and supplemental doses. The clinical list was updated. The glucose meter download was reviewed in  detail to determine if the current pump settings are providing the best glycemic control without excessive hypoglycemia.  Pump and meter download:    Pump   Omnipod Settings   Insulin type   Humalog    Basal rate       0000  2.3 u/h    0930 2.6          I:C ratio       0000 1:9   11:30 1:8              Sensitivity       0000  10      Goal       0000  110            Type & Model of Pump: Omnipod Insulin Type: Currently using Humalog .  PUMP STATISTICS: Average BG: 168 Average Daily Carbs (g): 21 Average Total Daily Insulin: 47.1 Average Daily Basal: 39.6( 84%) Average Daily Bolus: 7.5 (16%)      HOME DIABETES REGIMEN:  Mounjaro 7.5 mg weekly Humalog     CONTINUOUS GLUCOSE MONITORING RECORD INTERPRETATION    Dates of Recording: 6/25-04/22/2023  Sensor description:Dexcom  Results statistics:   CGM use % of time 82.4  Average and SD 168/32  Time in range 68%  % Time Above 180 31  % Time above 250 1  % Time Below target 0    Glycemic patterns summary: BG's trend down overnight and fluctuate throughout the day Hyperglycemic episodes  Post prandial  Hypoglycemic  episodes occurred  n/a  Overnight periods: trends down    DIABETIC COMPLICATIONS: Microvascular complications:  Retinopathy (bleeding in the eye )  Denies: CKD, neuropathy  Last eye exam: Completed 03/2023   Macrovascular complications:  CAD Denies: PVD, CVA       HISTORY:  Past Medical History:  Past Medical History:  Diagnosis Date   Anxiety    Clotting disorder (HCC)    blood clot in eye 2016   Coronary artery disease, non-occlusive    a. 11/2016 NSTEMI/Cath: LM nl, LAD 40p, 50md, D1/2 small, LCX 40ost, OM2/3 nl/small, RCA 35 ost/mid, RPDA/RPL small/nl.   Coronary vasospasm (HCC)    Depression    Diastolic dysfunction    a. 11/2016 Echo: EF 55-60%, gr1 DD, sev calcified MV annulus, mildly dil LA.   Eye hemorrhage 01/2015   "right; resolved" (07/03/2017)   Gastric  ulcer    Headache    "monthly" (07/03/2017)   History of blood transfusion    "when I had an ectopic pregnancy"   History of stomach ulcers    Hyperlipidemia    Hypertension    Microcytic anemia    Moderate mitral stenosis by prior echocardiogram 03/2021   TTE: Mod MS (mean gradient 9 mmHg). -- TEE Moderate-Severe MS (mean gradient 14 mmHg) with fixed Post MV Leaflet & Severe MAC w/ large circumscribed calcified mass on the Post MV Leaflet. (Mass previously described in 2018) - CMRI identifies calcified mass & not Tumor.; R&LHC - LVEDP & PCWP both 23 mmHg indicating minimal resting gradient   OSA on CPAP    setting is unknown   PAF (paroxysmal atrial fibrillation) (HCC) 03/2021   Event Monitor Aug-Sept 2022: Predominantly sinus rhythm.  1% A. fib burden.  30 brief episodes of PAT.  2 short bursts of NSVT.   PAT (paroxysmal atrial tachycardia)    Event monitor from August 2022 showed 30 brief bursts longest 14 seconds.   Pneumonia    "several times" (07/03/2017)   PVC's (premature ventricular contractions)    Rheumatoid arthritis (HCC)    Syncope 07/03/2017 X 2   called seizures but no medications   Thalassemia    "my cells are sickle cell shaped but I don't have sickle cell anemia" (07/03/2017)   Type II diabetes mellitus (HCC)    Past Surgical History:  Past Surgical History:  Procedure Laterality Date   48-Hour Monitor  03/18/2017   Sinus rhythm with sinus tachycardia (rate 58-134 BPM)multiple PVCs noted with couplets and bigeminy. One triplet. 7 runs of PAT ranging from 100-130 bpm. Longest was 33 beats.   BIOPSY  04/03/2021   Procedure: BIOPSY;  Surgeon: Benancio Deeds, MD;  Location: [facility];  Service: Gastroenterology;;   BIOPSY  12/17/2022   Procedure: BIOPSY;  Surgeon: Hilarie Fredrickson, MD;  Location: [facility];  Service: Gastroenterology;;   BREAST BIOPSY Left    BRONCHIAL WASHINGS  03/28/2021   Procedure: BRONCHIAL WASHINGS;  Surgeon: Steffanie Dunn, DO;   Location: [facility];  Service: Endoscopy;;   CARDIAC CATHETERIZATION N/A 11/19/2016   Procedure: Left Heart Cath and Coronary Angiography;  Surgeon: Lyn Records, MD;  Location: [facility] INVASIVE CV LAB;  Service: Cardiovascular.    LM nl, LAD 40p, 50md, D1/2 small, LCX 40ost, OM2/3 nl/small, RCA 35 ost/mid, RPDA/RPL small/nl.   CARDIAC MRI  03/30/2021   Normal LVEF ~53%. 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 No Scar or Late Gadolinium Enhancement on LV Myocardium.  CARPAL TUNNEL RELEASE Right 11/01/2014   COLONOSCOPY     DILATION AND CURETTAGE OF UTERUS     ECTOPIC PREGNANCY SURGERY  X 2   ENTEROSCOPY N/A 09/05/2022   Procedure: ENTEROSCOPY;  Surgeon: Meridee Score Netty Starring., MD;  Location: [facility];  Service: Gastroenterology;  Laterality: N/A;   ESOPHAGOGASTRODUODENOSCOPY N/A 12/17/2022   Procedure: ESOPHAGOGASTRODUODENOSCOPY (EGD);  Surgeon: Hilarie Fredrickson, MD;  Location: [facility];  Service: Gastroenterology;  Laterality: N/A;   ESOPHAGOGASTRODUODENOSCOPY (EGD) WITH PROPOFOL N/A 04/03/2021   Procedure: ESOPHAGOGASTRODUODENOSCOPY (EGD) WITH PROPOFOL;  Surgeon: Benancio Deeds, MD;  Location: [facility];  Service: Gastroenterology;  Laterality: N/A;   ESOPHAGOGASTRODUODENOSCOPY (EGD) WITH PROPOFOL N/A 08/15/2021   Procedure: ESOPHAGOGASTRODUODENOSCOPY (EGD) WITH PROPOFOL;  Surgeon: Meryl Dare, MD;  Location: [facility];  Service: Endoscopy;  Laterality: N/A;   HOT HEMOSTASIS N/A 09/05/2022   Procedure: HOT HEMOSTASIS (ARGON PLASMA COAGULATION/BICAP);  Surgeon: Lemar Lofty., MD;  Location: [facility];  Service: Gastroenterology;  Laterality: N/A;   JOINT REPLACEMENT     KNEE ARTHROSCOPY Left 11/2014   RIGHT/LEFT HEART CATH AND CORONARY ANGIOGRAPHY N/A 08/08/2021   Procedure: RIGHT/LEFT HEART CATH AND CORONARY ANGIOGRAPHY;  Surgeon: Marykay Lex, MD;  Location: [facility] INVASIVE CV LAB;  Service: Cardiovascular;Ost LCx  40%. Ost-Prox RCA 35%. Prox-Mid LAD 25%-<25% D2. Mod-Severely Elevated LVEDP. Mild PA HTN -> PA mean 31 mmHg with a PCWP and LVEDP of 23 mmHg   SUBMUCOSAL TATTOO INJECTION  09/05/2022   Procedure: SUBMUCOSAL TATTOO INJECTION;  Surgeon: Lemar Lofty., MD;  Location: [facility];  Service: Gastroenterology;;   TEE WITHOUT CARDIOVERSION N/A 03/28/2021   Procedure: TRANSESOPHAGEAL ECHOCARDIOGRAM (TEE);  Surgeon: Vesta Mixer, MD;  Location: [facility] ENDOSCOPY;; LVEF 55-60%. Normal LV Fxn & no RWMA. No LAA thrombus. Gr II Ao Plaque. Large circumscribed calcific mass (1.8 x 1.4 cm) anterior to posterior annulus.  Fixed posterior leaflet with Mod-Severe MS (Mean MVG ~14 mmHg, Peak 20 mmhg).  Mild MR   TONSILLECTOMY     TOTAL KNEE ARTHROPLASTY Right 02/18/2015   Procedure: RIGHT TOTAL KNEE ARTHROPLASTY;  Surgeon: Beverely Low, MD;  Location: [facility] OR;  Service: Orthopedics;  Laterality: Right;   TOTAL KNEE ARTHROPLASTY Left 08/19/2015   Procedure: LEFT TOTAL KNEE ARTHROPLASTY;  Surgeon: Beverely Low, MD;  Location: [facility] OR;  Service: Orthopedics;  Laterality: Left;   TRANSTHORACIC ECHOCARDIOGRAM  11/2016; 07/2017:   a) normal LV size, thickness and function. EF 55-60%. GR 1 DD.No RWMA severe mitral annular calcification, but no mitral stenosis. Mild LA dilation;; b) normal LV size and function.  EF 65-75%.  Unable to assess diastolic function.  Aortic sclerosis with no stenosis.  Moderate mitral stenosis? (Gradient 9 mmHg) -?  Mild-mod left atrial enlargement    TRANSTHORACIC ECHOCARDIOGRAM  01/2019   EF 65 to 70%.  Normal function.  Elevated LVEDP-GR 1 DD.  Normal RV size and function.  Large calcific mass on posterior mitral leaflet mild to moderate mitral stenosis.   TRANSTHORACIC ECHOCARDIOGRAM  03/24/2021   EF 60 to 65%.  LV function with normal wall motion.  Moderate basal septal LVH.  GRII DD & Mild LA dilation.-> elevated LVEDP.  Normal RV function.  Mildly elevated PAP.  Ill-defined density  measuring 3.23 x 2.1 cm region of the posterior MV leaflet along with dense MAC.  Moderate MS (mean MVG of 9 mmHg.) Normal AoV & RAP. --> Recommend TEE 2/2 concern for MV SBE.   TRANSTHORACIC ECHOCARDIOGRAM  04/26/2022   EF 60 to 65%.  No RWMA.  Moderate LVH with GR 1 DD.  Normal RV size and function.  Mild LA dilation.  Large calcific mass of posterior MV leaflet.  Mean PG 13 mm 3.  Trivial MR.  Mild to moderate MS.  Severe MAC.  Normal AOV.  Normal RAP.   VAGINAL HYSTERECTOMY     VIDEO BRONCHOSCOPY N/A 03/28/2021   Procedure: VIDEO BRONCHOSCOPY WITHOUT FLUORO;  Surgeon: Steffanie Dunn, DO;  Location: [facility] ENDOSCOPY;  Service: Endoscopy;  Laterality: N/A;   Social History:  reports that she quit smoking about 8 years ago. Her smoking use included cigarettes. She has a 11.00 pack-year smoking history. She has never used smokeless tobacco. She reports that she does not currently use alcohol. She reports that she does not use drugs. Family History:  Family History  Problem Relation Age of Onset   Cancer Mother    Diabetes Mother    Hypertension Mother    Hyperlipidemia Mother    Cancer Father    Hyperlipidemia Father    Mental illness Sister    Diabetes Sister    Diabetes Maternal Grandmother    Diabetes Maternal Grandfather    Colon cancer Neg Hx    Esophageal cancer Neg Hx    Stomach cancer Neg Hx    Rectal cancer Neg Hx    Tremor Neg Hx    Parkinson's disease Neg Hx      HOME MEDICATIONS: Allergies as of 04/22/2023       Reactions   Methotrexate Other (See Comments)   Pneumonitis   Penicillins Hives   Childhood allergy Has patient had a PCN reaction causing immediate rash, facial/tongue/throat swelling, SOB or lightheadedness with hypotension: Yes Has patient had a PCN reaction causing severe rash involving mucus membranes or skin necrosis: No Has patient had a PCN reaction that required hospitalization No Has patient had a PCN reaction occurring within the last 10 years: No If  all of the above answers are "NO", then may proceed with Cephalosporin use.   Farxiga [dapagliflozin] Other (See Comments)   Dizzy and lethargic   Metformin And Related Other (See Comments)   Diarrhea, bleeding        Medication List        Accurate as of April 22, 2023  2:58 PM. If you have any questions, ask your nurse or doctor.          STOP taking these medications    Omnipod 5 G6 Intro (Gen 5) Kit Stopped by: Scarlette Shorts, MD   Omnipod 5 G6 Pods (Gen 5) Misc Stopped by: Scarlette Shorts, MD   tirzepatide 7.5 MG/0.5ML Pen Commonly known as: MOUNJARO Replaced by: tirzepatide 10 MG/0.5ML Pen Stopped by: Scarlette Shorts, MD       TAKE these medications    acetaminophen 500 MG tablet Commonly known as: TYLENOL Take 500 mg by mouth every 6 (six) hours as needed for fever or moderate pain.   albuterol (2.5 MG/3ML) 0.083% nebulizer solution Commonly known as: PROVENTIL Take 3 mLs (2.5 mg total) by nebulization every 6 (six) hours as needed for wheezing or shortness of breath.   albuterol 108 (90 Base) MCG/ACT inhaler Commonly known as: VENTOLIN HFA TAKE 2 PUFFS BY MOUTH EVERY 6 HOURS AS NEEDED FOR WHEEZE OR SHORTNESS OF BREATH   apixaban 5 MG Tabs tablet Commonly known as: ELIQUIS Take 1 tablet (5 mg total) by mouth 2 (two) times daily. Resume taking on 12/19/22   atorvastatin 80 MG tablet Commonly  known as: LIPITOR Take 1 tablet (80 mg total) by mouth every evening.   B-D UF III MINI PEN NEEDLES 31G X 5 MM Misc Generic drug: Insulin Pen Needle USE DAILY WITH VICTOZA AND LANTUS. What changed: additional instructions   Insulin Pen Needle 31G X 5 MM Misc 1 Device by Does not apply route 3 (three) times daily. What changed: Another medication with the same name was changed. Make sure you understand how and when to take each.   cetirizine 10 MG tablet Commonly known as: ZYRTEC Take 10 mg by mouth daily as needed for allergies.   Contour  Next Test test strip Generic drug: glucose blood 3x daily   Dexcom G6 Sensor Misc 1 Device by Does not apply route as directed.   Dexcom G6 Transmitter Misc 1 Device by Does not apply route as directed.   diltiazem 360 MG 24 hr capsule Commonly known as: CARDIZEM CD Take 1 capsule (360 mg total) by mouth daily.   folic acid 1 MG tablet Commonly known as: FOLVITE Take 2 tablets (2 mg total) by mouth daily. What changed:  how much to take when to take this   furosemide 20 MG tablet Commonly known as: LASIX DECREASE LASIX ( FUROSEMIDE) TO 20 MG ( 1/2/TABLET) , WITH 20 MG YOU MAY TAKE A AN ADDITIONAL 20 MG IF NEED FOR SWELLING OR WEIGHT GAIN MORE THAN 3 LBS OVERNIGHT OR 5 LBS IN ONE WEEK -- IF YOU HAVE CONTINUE TO TAKE AN ADDITIONAL 20 MG , GO BACK TO 40 MG TAKE ADDITIONAL DOSE AS NEEDED FOR EGT GAIN> 3 LB IN 1 DAY   hydroxychloroquine 200 MG tablet Commonly known as: PLAQUENIL Take 200 mg by mouth 2 (two) times daily.   insulin lispro 100 UNIT/ML injection Commonly known as: HUMALOG Max Daily 100 units per pump What changed: additional instructions   isosorbide mononitrate 30 MG 24 hr tablet Commonly known as: IMDUR Take 1 tablet (30 mg total) by mouth daily.   ketoconazole 2 % cream Commonly known as: NIZORAL Apply 1 Application topically daily.   Lancets 30G Misc 1 Device by Does not apply route 3 (three) times daily.   leflunomide 20 MG tablet Commonly known as: ARAVA Take 20 mg by mouth daily.   losartan 100 MG tablet Commonly known as: COZAAR Take 0.5 tablets (50 mg total) by mouth daily.   Melatonin 5 MG Chew Chew 5 mg by mouth at bedtime.   multivitamin with minerals Tabs tablet Take 1 tablet by mouth daily.   pantoprazole 40 MG tablet Commonly known as: PROTONIX Take 1 tablet (40 mg total) by mouth daily.   SAFETY-LOK TB SYRINGE 27GX.5" 27G X 1/2" 1 ML Misc Generic drug: TUBERCULIN SYR 1CC/27GX1/2"   tirzepatide 10 MG/0.5ML Pen Commonly known  as: MOUNJARO Inject 10 mg into the skin once a week. Replaces: tirzepatide 7.5 MG/0.5ML Pen Started by: Scarlette Shorts, MD   traZODone 50 MG tablet Commonly known as: DESYREL Take 0.5-1 tablets (25-50 mg total) by mouth at bedtime as needed. for sleep What changed:  reasons to take this additional instructions   VICKS SINEX NA Place 1 spray into the nose daily as needed (congestion).         OBJECTIVE:   Vital Signs: BP 134/82 (BP Location: Left Arm, Patient Position: Sitting, Cuff Size: Large)   Pulse 85   Ht 5\' 3"  (1.6 m)   Wt 234 lb (106.1 kg)   LMP  (LMP Unknown)   SpO2 99%  BMI 41.45 kg/m     Wt Readings from Last 3 Encounters:  04/22/23 234 lb (106.1 kg)  12/31/22 236 lb (107 kg)  12/28/22 234 lb 12.8 oz (106.5 kg)     Exam: General: Pt appears well   Lungs: Clear with good BS bilat   Heart: RRR  Extremities: No pretibial edema  Neuro: MS is good with appropriate affect, pt is alert and Ox3       DM foot exam: 04/22/2023 The skin of the feet is intact without sores or ulcerations. The pedal pulses are 2+ on right and 2+ on left. The sensation is intact to a screening 5.07, 10 gram monofilament bilaterally    DATA REVIEWED:  Lab Results  Component Value Date   HGBA1C 6.8 (A) 04/22/2023   HGBA1C 6.5 (H) 12/15/2022   HGBA1C 7.6 (A) 04/12/2022         Latest Reference Range & Units 04/09/22 12:50  Sodium 135 - 145 mmol/L 140  Potassium 3.5 - 5.1 mmol/L 3.3 (L)  Chloride 98 - 111 mmol/L 98  CO2 22 - 32 mmol/L 31  Glucose 70 - 99 mg/dL 161 (H)  BUN 8 - 23 mg/dL 17  Creatinine 0.96 - 0.45 mg/dL 4.09 (H)  Calcium 8.9 - 10.3 mg/dL 81.1  Anion gap 5 - 15  11  Alkaline Phosphatase 38 - 126 U/L 79  Albumin 3.5 - 5.0 g/dL 4.2  AST 15 - 41 U/L 17  ALT 0 - 44 U/L 16  Total Protein 6.5 - 8.1 g/dL 7.5  Total Bilirubin 0.3 - 1.2 mg/dL 0.3    ASSESSMENT / PLAN / RECOMMENDATIONS:   1) Type 2 Diabetes Mellitus, Optimally , With retinopathy  and macrovascular complications - Most recent A1c of 6.8%. Goal A1c <7.0 %.     -I have praised the patient improved glycemic control - Switched Ozempic to [employer] due to shortage of supplies but also she was having nausea with Ozempic which did not occur with Mounjaro, will increase Mounjaro  -I have reduced basal rate to prevent hypoglycemia -She was provided with Dexcom G7 receiver, #1 sample sensor and a new prescription has been sent to upgrade to Dexcom G7  MEDICATIONS: Increase Mounjaro 10 mg weekly  Humalog per pump   Pump   Omnipod Settings   Insulin type   Humalog    Basal rate       0000  2.20 u/h    0930 2.50          I:C ratio       0000 1:9   11:30 1:8              Sensitivity       0000  10      Goal       0000  110             EDUCATION / INSTRUCTIONS: BG monitoring instructions: Patient is instructed to check her blood sugars 3 times a day, fasting , supper and bedtime. Call [facility] if: BG persistently < 70  I reviewed the Rule of 15 for the treatment of hypoglycemia in detail with the patient. Literature supplied.    F/U in 6 months    Signed electronically by: Lyndle Herrlich, MD  [facility]  [facility] [address] [place] [place], [place] [address] Phone: [phone] FAX: [phone]   CC: Pearline Cables, MD [address] [place] [address]  Phone: [phone]  Fax: [phone]  Return to Endocrinology clinic as below: No future appointments.

## 2023-04-22 NOTE — Patient Instructions (Signed)
Increase Mounjaro 10 mg weekly   HOW TO TREAT LOW BLOOD SUGARS (Blood sugar LESS THAN 70 MG/DL) Please follow the RULE OF 15 for the treatment of hypoglycemia treatment (when your (blood sugars are less than 70 mg/dL)   STEP 1: Take 15 grams of carbohydrates when your blood sugar is low, which includes:  3-4 GLUCOSE TABS  OR 3-4 OZ OF JUICE OR REGULAR SODA OR ONE TUBE OF GLUCOSE GEL    STEP 2: RECHECK blood sugar in 15 MINUTES STEP 3: If your blood sugar is still low at the 15 minute recheck --> then, go back to STEP 1 and treat AGAIN with another 15 grams of carbohydrates.

## 2023-04-23 ENCOUNTER — Encounter: Payer: Self-pay | Admitting: Internal Medicine

## 2023-05-11 ENCOUNTER — Encounter: Payer: Self-pay | Admitting: Internal Medicine

## 2023-05-13 ENCOUNTER — Other Ambulatory Visit: Payer: Self-pay

## 2023-05-13 ENCOUNTER — Encounter: Payer: Self-pay | Admitting: Internal Medicine

## 2023-05-13 MED ORDER — OMNIPOD 5 G7 INTRO (GEN 5) KIT: 1.0000 | PACK | 0 refills | Status: DC

## 2023-05-13 MED ORDER — OMNIPOD 5 G7 PODS (GEN 5) MISC: 1.0000 | 3 refills | Status: DC

## 2023-05-13 MED ORDER — INSULIN LISPRO 100 UNIT/ML IJ SOLN: INTRAMUSCULAR | 3 refills | Status: DC

## 2023-05-13 NOTE — Patient Instructions (Incomplete)
It was good to see you again today I will be in touch with your lab work Recommend shingles vaccine series if not done already- this can be done at your pharmacy We will get you in with ENT to look at your frequent nosebleeds -if an urgent situation please go to the Er for immediate treatment I will be in touch with your labs Please stop by x-ray for a right hip film today

## 2023-05-13 NOTE — Progress Notes (Unsigned)
[facility] [address] [place], [place] [address] [phone]  Date:  05/15/2023   Name:  Lynn [name]   DOB:  Aug 09, 1956   MRN:  [id number]  PCP:  Pearline Cables, MD    Chief Complaint: No chief complaint on file.   History of Present Illness:  Lynn [name] is a 66 y.o. very pleasant female patient who presents with the following:  Pt seen today for med follow up- history of paroxysmal atrial tach and fibrillation, on diltiazem and Eliquis, interstitial lung disease and COPD on oxygen, diabetes, history of CVA, rheumatoid arthritis, hypertension, thalassemia  Last seen by myself in November  She was seen by pulmonology in March and was found to have a Hg of 6.2 on routine lab screening-  She was admitted from 3/2- 3/5: Discharge Diagnoses: Principal Problem:   ABLA (acute blood loss anemia) Active Problems:   Essential hypertension   Chronic diastolic CHF (congestive heart failure) (HCC)   Type 2 diabetes mellitus with hyperlipidemia (HCC)   Hyperlipidemia associated with type 2 diabetes mellitus (HCC)   PAF (paroxysmal atrial fibrillation) (HCC); CHA2DS2-VASc score 7   Class 3 obesity (HCC)   ILD (interstitial lung disease) (HCC)   OSA on CPAP  Assessment and Plan:   Likely upper GI bleeding Acute blood loss anemia History of gastric AVMs, gastric ulcer, diverticulosis History of thalassemia Hemoglobin down from 8.2-->5.7 S/p 3 units of PRBC on 12/15/2022, hemoglobin now stable GI on board, s/p EGD on 3/4--> noted nonbleeding gastric ulcer with no stigmata of bleeding, biopsied PPI BID Continue to hold Eliquis till 12/19/22 Follow up with PCP and GI as scheduled HTN Resume losartan and imdur Chronic diastolic HF Likely pulmonary edema from possible transfusion Probnp 164 ECHO 2/24 showed EF of 60-65%, grade 1 DD CXR with noted vascular congestion with pulm edema, repeat CXR  with resolution of Pulm edema, no infiltrate noted S/p IV lasix, continue PO lasix as discussed  Resume home losartan, imdur Diabetes mellitus type 2 Last A1c 7.6 on 03/2022 Resume insulin pump at home, with continuous glucose monitoring Paroxysmal A-fib Resume PTA diltiazem Continue to Hold Eliquis, restart on 3/6 ILD Chronic hypoxic resp failure Patient on 3 L of O2 at baseline DuoNebs scheduled and as needed GERD Continue PPI BID Rheumatoid arthritis Continue Plaquenil Morbid obesity Lifestyle modification advised OSA CPAP  She was seen by endocrinology earlier this month: 1) Type 2 Diabetes Mellitus, Optimally , With retinopathy and macrovascular complications - Most recent A1c of 6.8%. Goal A1c <7.0 %.   -I have praised the patient improved glycemic control - Switched Ozempic to [employer] due to shortage of supplies but also she was having nausea with Ozempic which did not occur with Mounjaro, will increase Mounjaro  -I have reduced basal rate to prevent hypoglycemia -She was provided with Dexcom G7 receiver, #1 sample sensor and a new prescription has been sent to upgrade to Dexcom G7 MEDICATIONS: Increase Mounjaro 10 mg weekly  Humalog per pump  She saw GI, Dr. Russella Dar on 3/18 for follow-up of ulcer which caused GI bleed: Gastric ulcer with bleed, H pylori negative History of gastric AVMs Thalassemia trait Afib on Eliquis COPD on O2   Recommendations    Change pantoprazole to 40 mg daily long term Avoid NSAIDs long-term 3.   Screening colonoscopy Nov 2027    She also followed up with pulmonology after her hospitalization, on March 15-summary of  her pulmonary disease history as follows:  ILD (interstitial lung disease) (HCC) Hospitalized 2022 for pneumonitis secondary to methotrexate. She found out she did not have any down feathered pillows at home. Has since discontinued methotrexate. She was on extended prednisone taper and had significant improvement on imaging  and clinically. Unfortunately, she continued to struggle with daily symptoms of DOE and occasional cough. Felt to be multifactorial but given history of RA, repeat HRCT and PFTs were ordered to evaluate for progression. Her HRCT was stable compared to 06/2021 without evidence of worsening. She had a decline in her PFTs; however, was found to have anemia of 6.2 and pulmonary edema, which was likely the cause of this. She is clinically improved today. We will plan to repeat spiro/DLCO in 12 weeks.      Chronic respiratory failure with hypoxia (HCC) No desaturations on previous walk test. She still utilizes supplemental oxygen with activity. Advised her to monitor for goal >88-90% and use to maintain above this; otherwise, she does not have to wear it.    OSA on CPAP Awaiting sleep study results. Will discuss next steps once received. Cautioned on safe driving practices.      Patient Active Problem List   Diagnosis Date Noted   ABLA (acute blood loss anemia) 12/15/2022   Acute upper GI bleed 09/04/2022   IDA (iron deficiency anemia) 02/09/2022   Acute on chronic respiratory failure with hypoxia (HCC) 11/14/2021   AKI (acute kidney injury) (HCC)    Syncope 11/13/2021   Acute gastric ulcer without hemorrhage or perforation    Ulcer of esophagus without bleeding    PAF (paroxysmal atrial fibrillation) (HCC); CHA2DS2-VASc score 7 07/05/2021   Anemia    Acute gastric ulcer with hemorrhage    Occult GI bleeding    Melena    Preop respiratory exam    ILD (interstitial lung disease) (HCC)    SOB (shortness of breath) 03/24/2021   FUO (fever of unknown origin) 03/24/2021   Hypokalemia 03/24/2021   Generalized weakness 03/24/2021   Coronary artery spasm (HCC) 08/09/2020   Rheumatoid arthritis (HCC) 11/09/2019   Toe pain, bilateral 05/26/2019   Type 2 diabetes mellitus with hyperlipidemia (HCC) 05/26/2019   Mobility impaired 04/01/2019   Class 3 obesity (HCC) 12/03/2017   DOE (dyspnea on  exertion) 12/02/2017   Moderate mitral stenosis by prior echocardiography 08/23/2017   Chronic diastolic CHF (congestive heart failure) (HCC) 08/12/2017   Chronic respiratory failure with hypoxia (HCC) 08/12/2017   OSA on CPAP 08/12/2017   COPD with acute bronchitis (HCC) 08/12/2017   Near syncope 07/03/2017   Anxiety 07/03/2017   Coronary artery disease, non-occlusive 12/08/2016   PAT (paroxysmal atrial tachycardia)    H/O total knee replacement 08/19/2015   Vision, loss, sudden 03/07/2015   S/P TKR (total knee replacement) using cement 02/18/2015   Thalassemia 09/02/2013   Hyperlipidemia associated with type 2 diabetes mellitus (HCC) 08/24/2012   Essential hypertension 07/22/2012    Past Medical History:  Diagnosis Date   Anxiety    Clotting disorder (HCC)    blood clot in eye 2016   Coronary artery disease, non-occlusive    a. 11/2016 NSTEMI/Cath: LM nl, LAD 40p, 50md, D1/2 small, LCX 40ost, OM2/3 nl/small, RCA 35 ost/mid, RPDA/RPL small/nl.   Coronary vasospasm (HCC)    Depression    Diastolic dysfunction    a. 11/2016 Echo: EF 55-60%, gr1 DD, sev calcified MV annulus, mildly dil LA.   Eye hemorrhage 01/2015   "right; resolved" (07/03/2017)  Gastric ulcer    Headache    "monthly" (07/03/2017)   History of blood transfusion    "when I had an ectopic pregnancy"   History of stomach ulcers    Hyperlipidemia    Hypertension    Microcytic anemia    Moderate mitral stenosis by prior echocardiogram 03/2021   TTE: Mod MS (mean gradient 9 mmHg). -- TEE Moderate-Severe MS (mean gradient 14 mmHg) with fixed Post MV Leaflet & Severe MAC w/ large circumscribed calcified mass on the Post MV Leaflet. (Mass previously described in 2018) - CMRI identifies calcified mass & not Tumor.; R&LHC - LVEDP & PCWP both 23 mmHg indicating minimal resting gradient   OSA on CPAP    setting is unknown   PAF (paroxysmal atrial fibrillation) (HCC) 03/2021   Event Monitor Aug-Sept 2022: Predominantly  sinus rhythm.  1% A. fib burden.  30 brief episodes of PAT.  2 short bursts of NSVT.   PAT (paroxysmal atrial tachycardia)    Event monitor from August 2022 showed 30 brief bursts longest 14 seconds.   Pneumonia    "several times" (07/03/2017)   PVC's (premature ventricular contractions)    Rheumatoid arthritis (HCC)    Syncope 07/03/2017 X 2   called seizures but no medications   Thalassemia    "my cells are sickle cell shaped but I don't have sickle cell anemia" (07/03/2017)   Type II diabetes mellitus (HCC)     Past Surgical History:  Procedure Laterality Date   48-Hour Monitor  03/18/2017   Sinus rhythm with sinus tachycardia (rate 58-134 BPM)multiple PVCs noted with couplets and bigeminy. One triplet. 7 runs of PAT ranging from 100-130 bpm. Longest was 33 beats.   BIOPSY  04/03/2021   Procedure: BIOPSY;  Surgeon: Benancio Deeds, MD;  Location: [facility];  Service: Gastroenterology;;   BIOPSY  12/17/2022   Procedure: BIOPSY;  Surgeon: Hilarie Fredrickson, MD;  Location: [facility];  Service: Gastroenterology;;   BREAST BIOPSY Left    BRONCHIAL WASHINGS  03/28/2021   Procedure: BRONCHIAL WASHINGS;  Surgeon: Steffanie Dunn, DO;  Location: [facility];  Service: Endoscopy;;   CARDIAC CATHETERIZATION N/A 11/19/2016   Procedure: Left Heart Cath and Coronary Angiography;  Surgeon: Lyn Records, MD;  Location: [facility] INVASIVE CV LAB;  Service: Cardiovascular.    LM nl, LAD 40p, 50md, D1/2 small, LCX 40ost, OM2/3 nl/small, RCA 35 ost/mid, RPDA/RPL small/nl.   CARDIAC MRI  03/30/2021   Normal LVEF ~53%. 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 No Scar or Late Gadolinium Enhancement on LV Myocardium.   CARPAL TUNNEL RELEASE Right 11/01/2014   COLONOSCOPY     DILATION AND CURETTAGE OF UTERUS     ECTOPIC PREGNANCY SURGERY  X 2   ENTEROSCOPY N/A 09/05/2022   Procedure: ENTEROSCOPY;  Surgeon: Meridee Score Netty Starring., MD;  Location: [facility];   Service: Gastroenterology;  Laterality: N/A;   ESOPHAGOGASTRODUODENOSCOPY N/A 12/17/2022   Procedure: ESOPHAGOGASTRODUODENOSCOPY (EGD);  Surgeon: Hilarie Fredrickson, MD;  Location: [facility];  Service: Gastroenterology;  Laterality: N/A;   ESOPHAGOGASTRODUODENOSCOPY (EGD) WITH PROPOFOL N/A 04/03/2021   Procedure: ESOPHAGOGASTRODUODENOSCOPY (EGD) WITH PROPOFOL;  Surgeon: Benancio Deeds, MD;  Location: [facility];  Service: Gastroenterology;  Laterality: N/A;   ESOPHAGOGASTRODUODENOSCOPY (EGD) WITH PROPOFOL N/A 08/15/2021   Procedure: ESOPHAGOGASTRODUODENOSCOPY (EGD) WITH PROPOFOL;  Surgeon: Meryl Dare, MD;  Location: [facility];  Service: Endoscopy;  Laterality: N/A;   HOT HEMOSTASIS N/A 09/05/2022   Procedure: HOT  HEMOSTASIS (ARGON PLASMA COAGULATION/BICAP);  Surgeon: Lemar Lofty., MD;  Location: [facility];  Service: Gastroenterology;  Laterality: N/A;   JOINT REPLACEMENT     KNEE ARTHROSCOPY Left 11/2014   RIGHT/LEFT HEART CATH AND CORONARY ANGIOGRAPHY N/A 08/08/2021   Procedure: RIGHT/LEFT HEART CATH AND CORONARY ANGIOGRAPHY;  Surgeon: Marykay Lex, MD;  Location[facility] INVASIVE CV LAB;  Service: Cardiovascular;Ost LCx 40%. Ost-Prox RCA 35%. Prox-Mid LAD 25%-<25% D2. Mod-Severely Elevated LVEDP. Mild PA HTN -> PA mean 31 mmHg with a PCWP and LVEDP of 23 mmHg   SUBMUCOSAL TATTOO INJECTION  09/05/2022   Procedure: SUBMUCOSAL TATTOO INJECTION;  Surgeon: Lemar Lofty., MD;  Location: [facility];  Service: Gastroenterology;;   TEE WITHOUT CARDIOVERSION N/A 03/28/2021   Procedure: TRANSESOPHAGEAL ECHOCARDIOGRAM (TEE);  Surgeon: Vesta Mixer, MD;  Location: [facility] ENDOSCOPY;; LVEF 55-60%. Normal LV Fxn & no RWMA. No LAA thrombus. Gr II Ao Plaque. Large circumscribed calcific mass (1.8 x 1.4 cm) anterior to posterior annulus.  Fixed posterior leaflet with Mod-Severe MS (Mean MVG ~14 mmHg, Peak 20 mmhg).  Mild MR   TONSILLECTOMY     TOTAL KNEE ARTHROPLASTY Right  02/18/2015   Procedure: RIGHT TOTAL KNEE ARTHROPLASTY;  Surgeon: Beverely Low, MD;  Location: [facility] OR;  Service: Orthopedics;  Laterality: Right;   TOTAL KNEE ARTHROPLASTY Left 08/19/2015   Procedure: LEFT TOTAL KNEE ARTHROPLASTY;  Surgeon: Beverely Low, MD;  Location: [facility];  Service: Orthopedics;  Laterality: Left;   TRANSTHORACIC ECHOCARDIOGRAM  11/2016; 07/2017:   a) normal LV size, thickness and function. EF 55-60%. GR 1 DD.No RWMA severe mitral annular calcification, but no mitral stenosis. Mild LA dilation;; b) normal LV size and function.  EF 65-75%.  Unable to assess diastolic function.  Aortic sclerosis with no stenosis.  Moderate mitral stenosis? (Gradient 9 mmHg) -?  Mild-mod left atrial enlargement    TRANSTHORACIC ECHOCARDIOGRAM  01/2019   EF 65 to 70%.  Normal function.  Elevated LVEDP-GR 1 DD.  Normal RV size and function.  Large calcific mass on posterior mitral leaflet mild to moderate mitral stenosis.   TRANSTHORACIC ECHOCARDIOGRAM  03/24/2021   EF 60 to 65%.  LV function with normal wall motion.  Moderate basal septal LVH.  GRII DD & Mild LA dilation.-> elevated LVEDP.  Normal RV function.  Mildly elevated PAP.  Ill-defined density measuring 3.23 x 2.1 cm region of the posterior MV leaflet along with dense MAC.  Moderate MS (mean MVG of 9 mmHg.) Normal AoV & RAP. --> Recommend TEE 2/2 concern for MV SBE.   TRANSTHORACIC ECHOCARDIOGRAM  04/26/2022   EF 60 to 65%.  No RWMA.  Moderate LVH with GR 1 DD.  Normal RV size and function.  Mild LA dilation.  Large calcific mass of posterior MV leaflet.  Mean PG 13 mm 3.  Trivial MR.  Mild to moderate MS.  Severe MAC.  Normal AOV.  Normal RAP.   VAGINAL HYSTERECTOMY     VIDEO BRONCHOSCOPY N/A 03/28/2021   Procedure: VIDEO BRONCHOSCOPY WITHOUT FLUORO;  Surgeon: Steffanie Dunn, DO;  Location: [facility] ENDOSCOPY;  Service: Endoscopy;  Laterality: N/A;    Social History   Tobacco Use   Smoking status: Former    Current packs/day: 0.00    Average  packs/day: 0.3 packs/day for 44.0 years (11.0 ttl pk-yrs)    Types: Cigarettes    Start date: 02/17/1971    Quit date: 02/17/2015    Years since quitting: 8.2   Smokeless tobacco: Never  Vaping  Use   Vaping status: Never Used  Substance Use Topics   Alcohol use: Not Currently    Comment: 07/03/2017 "might have a few drinks/year"   Drug use: No    Family History  Problem Relation Age of Onset   Cancer Mother    Diabetes Mother    Hypertension Mother    Hyperlipidemia Mother    Cancer Father    Hyperlipidemia Father    Mental illness Sister    Diabetes Sister    Diabetes Maternal Grandmother    Diabetes Maternal Grandfather    Colon cancer Neg Hx    Esophageal cancer Neg Hx    Stomach cancer Neg Hx    Rectal cancer Neg Hx    Tremor Neg Hx    Parkinson's disease Neg Hx     Allergies  Allergen Reactions   Methotrexate Other (See Comments)    Pneumonitis   Penicillins Hives    Childhood allergy Has patient had a PCN reaction causing immediate rash, facial/tongue/throat swelling, SOB or lightheadedness with hypotension: Yes Has patient had a PCN reaction causing severe rash involving mucus membranes or skin necrosis: No Has patient had a PCN reaction that required hospitalization No Has patient had a PCN reaction occurring within the last 10 years: No If all of the above answers are "NO", then may proceed with Cephalosporin use.   Marcelline Deist [Dapagliflozin] Other (See Comments)    Dizzy and lethargic    Metformin And Related Other (See Comments)    Diarrhea, bleeding    Medication list has been reviewed and updated.  Current Outpatient Medications on File Prior to Visit  Medication Sig Dispense Refill   acetaminophen (TYLENOL) 500 MG tablet Take 500 mg by mouth every 6 (six) hours as needed for fever or moderate pain.     albuterol (PROVENTIL) (2.5 MG/3ML) 0.083% nebulizer solution Take 3 mLs (2.5 mg total) by nebulization every 6 (six) hours as needed for wheezing or  shortness of breath. 120 mL 5   albuterol (VENTOLIN HFA) 108 (90 Base) MCG/ACT inhaler TAKE 2 PUFFS BY MOUTH EVERY 6 HOURS AS NEEDED FOR WHEEZE OR SHORTNESS OF BREATH 8.5 each 2   apixaban (ELIQUIS) 5 MG TABS tablet Take 1 tablet (5 mg total) by mouth 2 (two) times daily. Resume taking on 12/19/22 180 tablet 3   atorvastatin (LIPITOR) 80 MG tablet Take 1 tablet (80 mg total) by mouth every evening. 90 tablet 3   cetirizine (ZYRTEC) 10 MG tablet Take 10 mg by mouth daily as needed for allergies.     Continuous Blood Gluc Transmit (DEXCOM G6 TRANSMITTER) MISC 1 Device by Does not apply route as directed. 1 each 3   Continuous Glucose Sensor (DEXCOM G7 SENSOR) MISC 1 Device by Does not apply route as directed. 9 each 3   diltiazem (CARDIZEM CD) 360 MG 24 hr capsule Take 1 capsule (360 mg total) by mouth daily. 90 capsule 1   folic acid (FOLVITE) 1 MG tablet Take 2 tablets (2 mg total) by mouth daily. (Patient taking differently: Take 1 mg by mouth in the morning and at bedtime.) 180 tablet 3   furosemide (LASIX) 20 MG tablet DECREASE LASIX ( FUROSEMIDE) TO 20 MG ( 1/2/TABLET) , WITH 20 MG YOU MAY TAKE A AN ADDITIONAL 20 MG IF NEED FOR SWELLING OR WEIGHT GAIN MORE THAN 3 LBS OVERNIGHT OR 5 LBS IN ONE WEEK -- IF YOU HAVE CONTINUE TO TAKE AN ADDITIONAL 20 MG , GO BACK TO 40 MG TAKE  ADDITIONAL DOSE AS NEEDED FOR EGT GAIN> 3 LB IN 1 DAY 90 tablet 1   glucose blood (CONTOUR NEXT TEST) test strip 3x daily 300 each 11   hydroxychloroquine (PLAQUENIL) 200 MG tablet Take 200 mg by mouth 2 (two) times daily.     Insulin Disposable Pump (OMNIPOD 5 G7 INTRO, GEN 5,) KIT 1 Device by Does not apply route every other day. 1 kit 0   Insulin Disposable Pump (OMNIPOD 5 G7 PODS, GEN 5,) MISC 1 Device by Does not apply route every other day. 45 each 3   insulin lispro (HUMALOG) 100 UNIT/ML injection Max Daily 100 units per pump 90 mL 3   Insulin Pen Needle (B-D UF III MINI PEN NEEDLES) 31G X 5 MM MISC USE DAILY WITH VICTOZA  AND LANTUS. (Patient taking differently: Use for ozempic) 400 each 1   Insulin Pen Needle 31G X 5 MM MISC 1 Device by Does not apply route 3 (three) times daily. 150 each 11   isosorbide mononitrate (IMDUR) 30 MG 24 hr tablet Take 1 tablet (30 mg total) by mouth daily. 90 tablet 3   ketoconazole (NIZORAL) 2 % cream Apply 1 Application topically daily. 60 g 2   Lancets 30G MISC 1 Device by Does not apply route 3 (three) times daily. 300 each 9   leflunomide (ARAVA) 20 MG tablet Take 20 mg by mouth daily.     losartan (COZAAR) 100 MG tablet Take 0.5 tablets (50 mg total) by mouth daily. 90 tablet 3   Melatonin 5 MG CHEW Chew 5 mg by mouth at bedtime.     Multiple Vitamin (MULTIVITAMIN WITH MINERALS) TABS tablet Take 1 tablet by mouth daily.     Oxymetazoline HCl (VICKS SINEX NA) Place 1 spray into the nose daily as needed (congestion).     pantoprazole (PROTONIX) 40 MG tablet Take 1 tablet (40 mg total) by mouth daily. 30 tablet 11   SAFETY-LOK TB SYRINGE 27GX.5" 27G X 1/2" 1 ML MISC      tirzepatide (MOUNJARO) 10 MG/0.5ML Pen Inject 10 mg into the skin once a week. 6 mL 3   traZODone (DESYREL) 50 MG tablet Take 0.5-1 tablets (25-50 mg total) by mouth at bedtime as needed. for sleep (Patient taking differently: Take 25-50 mg by mouth at bedtime as needed for sleep.) 90 tablet 2   No current facility-administered medications on file prior to visit.    Review of Systems:  As per HPI- otherwise negative.   Physical Examination: There were no vitals filed for this visit. There were no vitals filed for this visit. There is no height or weight on file to calculate BMI. Ideal Body Weight:    GEN: no acute distress. HEENT: Atraumatic, Normocephalic.  Ears and Nose: No external deformity. CV: RRR, No M/G/R. No JVD. No thrill. No extra heart sounds. PULM: CTA B, no wheezes, crackles, rhonchi. No retractions. No resp. distress. No accessory muscle use. ABD: S, NT, ND, +BS. No rebound. No  HSM. EXTR: No c/c/e PSYCH: Normally interactive. Conversant.    Assessment and Plan: ***  Signed Abbe Amsterdam, MD

## 2023-05-15 ENCOUNTER — Ambulatory Visit (INDEPENDENT_AMBULATORY_CARE_PROVIDER_SITE_OTHER): Payer: Medicare Other | Admitting: Family Medicine

## 2023-05-15 ENCOUNTER — Ambulatory Visit (HOSPITAL_BASED_OUTPATIENT_CLINIC_OR_DEPARTMENT_OTHER)
Admission: RE | Admit: 2023-05-15 | Discharge: 2023-05-15 | Disposition: A | Discharge status: Home / Self Care | Payer: Medicare Other | Source: Ambulatory Visit | Attending: Family Medicine | Admitting: Family Medicine

## 2023-05-15 VITALS — BP 122/80 | HR 79 | Temp 98.3°F | Resp 18 | Ht 63.0 in | Wt 235.8 lb

## 2023-05-15 DIAGNOSIS — G4700 Insomnia, unspecified: Secondary | ICD-10-CM

## 2023-05-15 DIAGNOSIS — K922 Gastrointestinal hemorrhage, unspecified: Secondary | ICD-10-CM | POA: Diagnosis not present

## 2023-05-15 DIAGNOSIS — M25551 Pain in right hip: Secondary | ICD-10-CM | POA: Diagnosis not present

## 2023-05-15 DIAGNOSIS — R040 Epistaxis: Secondary | ICD-10-CM | POA: Diagnosis not present

## 2023-05-15 DIAGNOSIS — E1169 Type 2 diabetes mellitus with other specified complication: Secondary | ICD-10-CM | POA: Diagnosis not present

## 2023-05-15 DIAGNOSIS — Z7985 Long-term (current) use of injectable non-insulin antidiabetic drugs: Secondary | ICD-10-CM

## 2023-05-15 DIAGNOSIS — G8929 Other chronic pain: Secondary | ICD-10-CM | POA: Diagnosis not present

## 2023-05-15 DIAGNOSIS — N189 Chronic kidney disease, unspecified: Secondary | ICD-10-CM | POA: Diagnosis not present

## 2023-05-15 DIAGNOSIS — E785 Hyperlipidemia, unspecified: Secondary | ICD-10-CM

## 2023-05-15 DIAGNOSIS — M069 Rheumatoid arthritis, unspecified: Secondary | ICD-10-CM

## 2023-05-15 DIAGNOSIS — M1611 Unilateral primary osteoarthritis, right hip: Secondary | ICD-10-CM | POA: Diagnosis not present

## 2023-05-15 MED ORDER — TRAZODONE HCL 50 MG PO TABS: 25.0000 mg | ORAL_TABLET | Freq: Every evening | ORAL | 3 refills | Status: DC | PRN

## 2023-05-16 ENCOUNTER — Encounter: Payer: Self-pay | Admitting: Family Medicine

## 2023-05-16 NOTE — Addendum Note (Signed)
Addended by: Abbe Amsterdam C on: 05/16/2023 05:26 PM   Modules accepted: Orders

## 2023-05-21 ENCOUNTER — Encounter: Payer: Self-pay | Admitting: Family Medicine

## 2023-05-31 ENCOUNTER — Other Ambulatory Visit: Payer: Self-pay | Admitting: Nurse Practitioner

## 2023-06-05 ENCOUNTER — Other Ambulatory Visit: Payer: Self-pay | Admitting: Family Medicine

## 2023-06-05 DIAGNOSIS — Z1231 Encounter for screening mammogram for malignant neoplasm of breast: Secondary | ICD-10-CM

## 2023-06-20 ENCOUNTER — Ambulatory Visit (INDEPENDENT_AMBULATORY_CARE_PROVIDER_SITE_OTHER): Payer: Medicare Other | Admitting: *Deleted

## 2023-06-20 VITALS — Ht 63.0 in | Wt 242.0 lb

## 2023-06-20 DIAGNOSIS — Z780 Asymptomatic menopausal state: Secondary | ICD-10-CM | POA: Diagnosis not present

## 2023-06-20 DIAGNOSIS — Z0000 Encounter for general adult medical examination without abnormal findings: Secondary | ICD-10-CM | POA: Diagnosis not present

## 2023-06-20 NOTE — Progress Notes (Signed)
Subjective:   Lynn [name] is a 67 y.o. female who presents for Medicare Annual (Subsequent) preventive examination.  Visit Complete: Virtual  I connected with  Lynn [name] on 06/20/23 by a audio enabled telemedicine application and verified that I am speaking with the correct person using two identifiers.  Patient Location: Home  Provider Location: Office/Clinic  I discussed the limitations of evaluation and management by telemedicine. The patient expressed understanding and agreed to proceed.  Patient Medicare AWV questionnaire was completed by the patient on 06/19/23; I have confirmed that all information answered by patient is correct and no changes since this date.  Review of Systems     Cardiac Risk Factors include: advanced age (>29men, >67 women);obesity (BMI >30kg/m2);diabetes mellitus;dyslipidemia;hypertension;smoking/ tobacco exposure     Objective:   Vital Signs: Vital signs are patient reported.  Today's Vitals   06/19/23 1229  Weight: 242 lb (109.8 kg)  Height: 5\' 3"  (1.6 m)  PainSc: 7    Body mass index is 42.87 kg/m.     06/20/2023    1:53 PM 12/26/2022    8:11 PM 12/15/2022   12:36 PM 09/04/2022   10:00 PM 06/14/2022    1:17 PM 06/07/2022    3:00 PM 04/09/2022    1:21 PM  Advanced Directives  Does Patient Have a Medical Advance Directive? No No No No  No No  Would patient like information on creating a medical advance directive? No - Patient declined No - Patient declined No - Patient declined No - Patient declined No - Patient declined No - Patient declined No - Patient declined    Current Medications (verified) Outpatient Encounter Medications as of 06/20/2023  Medication Sig   acetaminophen (TYLENOL) 500 MG tablet Take 500 mg by mouth every 6 (six) hours as needed for fever or moderate pain.   albuterol (PROVENTIL) (2.5 MG/3ML) 0.083% nebulizer solution Take 3 mLs (2.5 mg total) by nebulization every 6 (six) hours as needed for  wheezing or shortness of breath.   albuterol (VENTOLIN HFA) 108 (90 Base) MCG/ACT inhaler TAKE 2 PUFFS BY MOUTH EVERY 6 HOURS AS NEEDED FOR WHEEZE OR SHORTNESS OF BREATH   apixaban (ELIQUIS) 5 MG TABS tablet Take 1 tablet (5 mg total) by mouth 2 (two) times daily. Resume taking on 12/19/22   atorvastatin (LIPITOR) 80 MG tablet Take 1 tablet (80 mg total) by mouth every evening.   cetirizine (ZYRTEC) 10 MG tablet Take 10 mg by mouth daily as needed for allergies.   Continuous Blood Gluc Transmit (DEXCOM G6 TRANSMITTER) MISC 1 Device by Does not apply route as directed.   Continuous Glucose Sensor (DEXCOM G7 SENSOR) MISC 1 Device by Does not apply route as directed.   diltiazem (CARDIZEM CD) 360 MG 24 hr capsule Take 1 capsule (360 mg total) by mouth daily.   folic acid (FOLVITE) 1 MG tablet Take 2 tablets (2 mg total) by mouth daily. (Patient taking differently: Take 1 mg by mouth in the morning and at bedtime.)   furosemide (LASIX) 20 MG tablet DECREASE LASIX ( FUROSEMIDE) TO 20 MG ( 1/2/TABLET) , WITH 20 MG YOU MAY TAKE A AN ADDITIONAL 20 MG IF NEED FOR SWELLING OR WEIGHT GAIN MORE THAN 3 LBS OVERNIGHT OR 5 LBS IN ONE WEEK -- IF YOU HAVE CONTINUE TO TAKE AN ADDITIONAL 20 MG , GO BACK TO 40 MG TAKE ADDITIONAL DOSE AS NEEDED FOR EGT GAIN> 3 LB IN 1 DAY   glucose blood (CONTOUR NEXT TEST) test  strip 3x daily   hydroxychloroquine (PLAQUENIL) 200 MG tablet Take 200 mg by mouth 2 (two) times daily.   Insulin Disposable Pump (OMNIPOD 5 G7 INTRO, GEN 5,) KIT 1 Device by Does not apply route every other day.   Insulin Disposable Pump (OMNIPOD 5 G7 PODS, GEN 5,) MISC 1 Device by Does not apply route every other day.   insulin lispro (HUMALOG) 100 UNIT/ML injection Max Daily 100 units per pump   Insulin Pen Needle (B-D UF III MINI PEN NEEDLES) 31G X 5 MM MISC USE DAILY WITH VICTOZA AND LANTUS. (Patient taking differently: Use for ozempic)   Insulin Pen Needle 31G X 5 MM MISC 1 Device by Does not apply route 3  (three) times daily.   isosorbide mononitrate (IMDUR) 30 MG 24 hr tablet Take 1 tablet (30 mg total) by mouth daily.   ketoconazole (NIZORAL) 2 % cream Apply 1 Application topically daily.   Lancets 30G MISC 1 Device by Does not apply route 3 (three) times daily.   leflunomide (ARAVA) 20 MG tablet Take 20 mg by mouth daily.   losartan (COZAAR) 100 MG tablet Take 0.5 tablets (50 mg total) by mouth daily.   Melatonin 5 MG CHEW Chew 5 mg by mouth at bedtime.   Multiple Vitamin (MULTIVITAMIN WITH MINERALS) TABS tablet Take 1 tablet by mouth daily.   Oxymetazoline HCl (VICKS SINEX NA) Place 1 spray into the nose daily as needed (congestion).   pantoprazole (PROTONIX) 40 MG tablet Take 1 tablet (40 mg total) by mouth daily.   SAFETY-LOK TB SYRINGE 27GX.5" 27G X 1/2" 1 ML MISC    tirzepatide (MOUNJARO) 10 MG/0.5ML Pen Inject 10 mg into the skin once a week.   traZODone (DESYREL) 50 MG tablet Take 0.5-1 tablets (25-50 mg total) by mouth at bedtime as needed. for sleep   No facility-administered encounter medications on file as of 06/20/2023.    Allergies (verified) Methotrexate, Penicillins, Farxiga [dapagliflozin], and Metformin and related   History: Past Medical History:  Diagnosis Date   Allergy 2018   Anxiety    CHF (congestive heart failure) (HCC)    Clotting disorder (HCC)    blood clot in eye 2016   COPD (chronic obstructive pulmonary disease) (HCC)    Coronary artery disease, non-occlusive    a. 11/2016 NSTEMI/Cath: LM nl, LAD 40p, 50md, D1/2 small, LCX 40ost, OM2/3 nl/small, RCA 35 ost/mid, RPDA/RPL small/nl.   Coronary vasospasm (HCC)    Depression    Diastolic dysfunction    a. 11/2016 Echo: EF 55-60%, gr1 DD, sev calcified MV annulus, mildly dil LA.   Eye hemorrhage 01/2015   "right; resolved" (07/03/2017)   Gastric ulcer    GERD (gastroesophageal reflux disease)    Headache    "monthly" (07/03/2017)   Heart murmur    History of blood transfusion    "when I had an ectopic  pregnancy"   History of stomach ulcers    Hyperlipidemia    Hypertension    Microcytic anemia    Moderate mitral stenosis by prior echocardiogram 03/2021   TTE: Mod MS (mean gradient 9 mmHg). -- TEE Moderate-Severe MS (mean gradient 14 mmHg) with fixed Post MV Leaflet & Severe MAC w/ large circumscribed calcified mass on the Post MV Leaflet. (Mass previously described in 2018) - CMRI identifies calcified mass & not Tumor.; R&LHC - LVEDP & PCWP both 23 mmHg indicating minimal resting gradient   Myocardial infarction (HCC) 2023   OSA on CPAP    setting is  unknown   Oxygen deficiency    PAF (paroxysmal atrial fibrillation) (HCC) 03/2021   Event Monitor Aug-Sept 2022: Predominantly sinus rhythm.  1% A. fib burden.  30 brief episodes of PAT.  2 short bursts of NSVT.   PAT (paroxysmal atrial tachycardia)    Event monitor from August 2022 showed 30 brief bursts longest 14 seconds.   Pneumonia    "several times" (07/03/2017)   PVC's (premature ventricular contractions)    Rheumatoid arthritis (HCC)    Sickle cell anemia (HCC)    Sleep apnea    Stroke (HCC)    Syncope 07/03/2017 X 2   called seizures but no medications   Thalassemia    "my cells are sickle cell shaped but I don't have sickle cell anemia" (07/03/2017)   Type II diabetes mellitus (HCC)    Past Surgical History:  Procedure Laterality Date   48-Hour Monitor  03/18/2017   Sinus rhythm with sinus tachycardia (rate 58-134 BPM)multiple PVCs noted with couplets and bigeminy. One triplet. 7 runs of PAT ranging from 100-130 bpm. Longest was 33 beats.   BIOPSY  04/03/2021   Procedure: BIOPSY;  Surgeon: Benancio Deeds, MD;  Location: [facility];  Service: Gastroenterology;;   BIOPSY  12/17/2022   Procedure: BIOPSY;  Surgeon: Hilarie Fredrickson, MD;  Location: [facility];  Service: Gastroenterology;;   BREAST BIOPSY Left    BRONCHIAL WASHINGS  03/28/2021   Procedure: BRONCHIAL WASHINGS;  Surgeon: Steffanie Dunn, DO;  Location: [facility]  ENDOSCOPY;  Service: Endoscopy;;   CARDIAC CATHETERIZATION N/A 11/19/2016   Procedure: Left Heart Cath and Coronary Angiography;  Surgeon: Lyn Records, MD;  Location: [facility] INVASIVE CV LAB;  Service: Cardiovascular.    LM nl, LAD 40p, 50md, D1/2 small, LCX 40ost, OM2/3 nl/small, RCA 35 ost/mid, RPDA/RPL small/nl.   CARDIAC MRI  03/30/2021   Normal LVEF ~53%. 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 No Scar or Late Gadolinium Enhancement on LV Myocardium.   CARPAL TUNNEL RELEASE Right 11/01/2014   COLONOSCOPY     DILATION AND CURETTAGE OF UTERUS     ECTOPIC PREGNANCY SURGERY  X 2   ENTEROSCOPY N/A 09/05/2022   Procedure: ENTEROSCOPY;  Surgeon: Meridee Score Netty Starring., MD;  Location: [facility];  Service: Gastroenterology;  Laterality: N/A;   ESOPHAGOGASTRODUODENOSCOPY N/A 12/17/2022   Procedure: ESOPHAGOGASTRODUODENOSCOPY (EGD);  Surgeon: Hilarie Fredrickson, MD;  Location: [facility];  Service: Gastroenterology;  Laterality: N/A;   ESOPHAGOGASTRODUODENOSCOPY (EGD) WITH PROPOFOL N/A 04/03/2021   Procedure: ESOPHAGOGASTRODUODENOSCOPY (EGD) WITH PROPOFOL;  Surgeon: Benancio Deeds, MD;  Location: [facility] ENDOSCOPY;  Service: Gastroenterology;  Laterality: N/A;   ESOPHAGOGASTRODUODENOSCOPY (EGD) WITH PROPOFOL N/A 08/15/2021   Procedure: ESOPHAGOGASTRODUODENOSCOPY (EGD) WITH PROPOFOL;  Surgeon: Meryl Dare, MD;  Location: [facility] ENDOSCOPY;  Service: Endoscopy;  Laterality: N/A;   HOT HEMOSTASIS N/A 09/05/2022   Procedure: HOT HEMOSTASIS (ARGON PLASMA COAGULATION/BICAP);  Surgeon: Lemar Lofty., MD;  Location: [facility];  Service: Gastroenterology;  Laterality: N/A;   JOINT REPLACEMENT     KNEE ARTHROSCOPY Left 11/2014   RIGHT/LEFT HEART CATH AND CORONARY ANGIOGRAPHY N/A 08/08/2021   Procedure: RIGHT/LEFT HEART CATH AND CORONARY ANGIOGRAPHY;  Surgeon: Marykay Lex, MD;  Location: [facility] INVASIVE CV LAB;  Service: Cardiovascular;Ost LCx 40%. Ost-Prox  RCA 35%. Prox-Mid LAD 25%-<25% D2. Mod-Severely Elevated LVEDP. Mild PA HTN -> PA mean 31 mmHg with a PCWP and LVEDP of 23 mmHg   SUBMUCOSAL TATTOO INJECTION  09/05/2022   Procedure:  SUBMUCOSAL TATTOO INJECTION;  Surgeon: Meridee Score Netty Starring., MD;  Location: [facility];  Service: Gastroenterology;;   TEE WITHOUT CARDIOVERSION N/A 03/28/2021   Procedure: TRANSESOPHAGEAL ECHOCARDIOGRAM (TEE);  Surgeon: Vesta Mixer, MD;  [facility] ENDOSCOPY;; LVEF 55-60%. Normal LV Fxn & no RWMA. No LAA thrombus. Gr II Ao Plaque. Large circumscribed calcific mass (1.8 x 1.4 cm) anterior to posterior annulus.  Fixed posterior leaflet with Mod-Severe MS (Mean MVG ~14 mmHg, Peak 20 mmhg).  Mild MR   TONSILLECTOMY     TOTAL KNEE ARTHROPLASTY Right 02/18/2015   Procedure: RIGHT TOTAL KNEE ARTHROPLASTY;  Surgeon: Beverely Low, MD;  Location[facility] OR;  Service: Orthopedics;  Laterality: Right;   TOTAL KNEE ARTHROPLASTY Left 08/19/2015   Procedure: LEFT TOTAL KNEE ARTHROPLASTY;  Surgeon: Beverely Low, MD;  Location: [facility] OR;  Service: Orthopedics;  Laterality: Left;   TRANSTHORACIC ECHOCARDIOGRAM  11/2016; 07/2017:   a) normal LV size, thickness and function. EF 55-60%. GR 1 DD.No RWMA severe mitral annular calcification, but no mitral stenosis. Mild LA dilation;; b) normal LV size and function.  EF 65-75%.  Unable to assess diastolic function.  Aortic sclerosis with no stenosis.  Moderate mitral stenosis? (Gradient 9 mmHg) -?  Mild-mod left atrial enlargement    TRANSTHORACIC ECHOCARDIOGRAM  01/2019   EF 65 to 70%.  Normal function.  Elevated LVEDP-GR 1 DD.  Normal RV size and function.  Large calcific mass on posterior mitral leaflet mild to moderate mitral stenosis.   TRANSTHORACIC ECHOCARDIOGRAM  03/24/2021   EF 60 to 65%.  LV function with normal wall motion.  Moderate basal septal LVH.  GRII DD & Mild LA dilation.-> elevated LVEDP.  Normal RV function.  Mildly elevated PAP.  Ill-defined density measuring 3.23 x  2.1 cm region of the posterior MV leaflet along with dense MAC.  Moderate MS (mean MVG of 9 mmHg.) Normal AoV & RAP. --> Recommend TEE 2/2 concern for MV SBE.   TRANSTHORACIC ECHOCARDIOGRAM  04/26/2022   EF 60 to 65%.  No RWMA.  Moderate LVH with GR 1 DD.  Normal RV size and function.  Mild LA dilation.  Large calcific mass of posterior MV leaflet.  Mean PG 13 mm 3.  Trivial MR.  Mild to moderate MS.  Severe MAC.  Normal AOV.  Normal RAP.   VAGINAL HYSTERECTOMY     VIDEO BRONCHOSCOPY N/A 03/28/2021   Procedure: VIDEO BRONCHOSCOPY WITHOUT FLUORO;  Surgeon: Steffanie Dunn, DO;  Location[facility] ENDOSCOPY;  Service: Endoscopy;  Laterality: N/A;   Family History  Problem Relation Age of Onset   Cancer Mother    Diabetes Mother    Hypertension Mother    Hyperlipidemia Mother    Cancer Father    Hyperlipidemia Father    Mental illness Sister    Diabetes Sister    Diabetes Maternal Grandmother    Diabetes Maternal Grandfather    Colon cancer Neg Hx    Esophageal cancer Neg Hx    Stomach cancer Neg Hx    Rectal cancer Neg Hx    Tremor Neg Hx    Parkinson's disease Neg Hx    Social History   Socioeconomic History   Marital status: Married    Spouse name: Not on file   Number of children: 1   Years of education: Not on file   Highest education level: Associate degree: occupational, Scientist, product/process development, or vocational program  Occupational History   Occupation: retired  Tobacco Use   Smoking status: Former  Current packs/day: 0.00    Average packs/day: 0.3 packs/day for 44.0 years (11.0 ttl pk-yrs)    Types: Cigarettes    Start date: 02/17/1971    Quit date: 02/17/2015    Years since quitting: 8.3   Smokeless tobacco: Never  Vaping Use   Vaping status: Never Used  Substance and Sexual Activity   Alcohol use: Not Currently    Comment: 07/03/2017 "might have a few drinks/year"   Drug use: No   Sexual activity: Never  Other Topics Concern   Not on file  Social History Narrative   Lives at home  with husband   Right handed   Caffeine: 1 cup coffee in AM    Social Determinants of Health   Financial Resource Strain: Low Risk  (06/19/2023)   Overall Financial Resource Strain (CARDIA)    Difficulty of Paying Living Expenses: Not very hard  Food Insecurity: Food Insecurity Present (06/19/2023)   Hunger Vital Sign    Worried About Running Out of Food in the Last Year: Sometimes true    Ran Out of Food in the Last Year: Sometimes true  Transportation Needs: No Transportation Needs (06/19/2023)   PRAPARE - Administrator, Civil Service (Medical): No    Lack of Transportation (Non-Medical): No  Physical Activity: Insufficiently Active (06/19/2023)   Exercise Vital Sign    Days of Exercise per Week: 1 day    Minutes of Exercise per Session: 10 min  Stress: Stress Concern Present (06/19/2023)   [employer] of Occupational Health - Occupational Stress Questionnaire    Feeling of Stress : Very much  Social Connections: Moderately Isolated (06/19/2023)   Social Connection and Isolation Panel [NHANES]    Frequency of Communication with Friends and Family: Twice a week    Frequency of Social Gatherings with Friends and Family: Once a week    Attends Religious Services: Never    Database administrator or Organizations: No    Attends Engineer, structural: Never    Marital Status: Married    Tobacco Counseling Counseling given: Not Answered   Clinical Intake:  Pre-visit preparation completed: Yes  Pain : 0-10 Pain Score: 7  Pain Type: Chronic pain Pain Location: Back Pain Orientation: Lower Pain Descriptors / Indicators: Aching, Constant, Radiating Pain Onset: More than a month ago Pain Frequency: Constant  BMI - recorded: 42.87 Nutritional Status: BMI > 30  Obese Nutritional Risks: None Diabetes: Yes CBG done?: No Did pt. bring in CBG monitor from home?: No  How often do you need to have someone help you when you read instructions, pamphlets, or other  written materials from your doctor or pharmacy?: 1 - Never  Interpreter Needed?: No  Information entered by :: Donne Anon, CMA   Activities of Daily Living    06/19/2023   12:29 PM 12/15/2022   12:36 PM  In your present state of health, do you have any difficulty performing the following activities:  Hearing? 0 0  Vision? 0 0  Difficulty concentrating or making decisions? 0 0  Walking or climbing stairs? 1 1  Dressing or bathing? 0 0  Doing errands, shopping? 1 0  Preparing Food and eating ? Y   Comment husband assists   Using the Toilet? N   In the past six months, have you accidently leaked urine? Y   Do you have problems with loss of bowel control? Y   Managing your Medications? N   Managing your Finances? N  Housekeeping or managing your Housekeeping? Y   Comment husband assists     Patient Care Team: Copland, Gwenlyn Found, MD as PCP - General (Family Medicine) Marykay Lex, MD as PCP - Cardiology (Cardiology) Duke, Roe Rutherford, PA as Physician Assistant (Cardiology)  Indicate any recent Medical Services you may have received from other than [facility] providers in the past year (date may be approximate).     Assessment:   This is a routine wellness examination for Lynn [name].  Hearing/Vision screen No results found.   Goals Addressed   None    Depression Screen    06/20/2023    1:48 PM 09/10/2022    2:11 PM 06/14/2022    1:22 PM 06/08/2021    1:10 PM 02/20/2021    1:31 PM 05/01/2019    2:42 PM 01/30/2017    5:22 PM  PHQ 2/9 Scores  PHQ - 2 Score 1 0 1 1 0 0 0    Fall Risk    06/19/2023   12:29 PM 06/14/2022    1:18 PM 06/08/2021    1:09 PM 02/20/2021    1:30 PM 05/01/2019    2:41 PM  Fall Risk   Falls in the past year? 0 0 0 1 0  Number falls in past yr: 0 0 0 1   Injury with Fall? 0 0 0 0   Risk for fall due to : Impaired balance/gait No Fall Risks     Follow up Falls evaluation completed Falls evaluation completed Falls prevention discussed      MEDICARE  RISK AT HOME: Medicare Risk at Home Any stairs in or around the home?: Yes If so, are there any without handrails?: No Home free of loose throw rugs in walkways, pet beds, electrical cords, etc?: No Adequate lighting in your home to reduce risk of falls?: Yes Life alert?: Yes Use of a cane, walker or w/c?: Yes Grab bars in the bathroom?: Yes Shower chair or bench in shower?: Yes Elevated toilet seat or a handicapped toilet?: No  TIMED UP AND GO:  Was the test performed?  No    Cognitive Function:        06/20/2023    1:54 PM 06/14/2022    1:35 PM  6CIT Screen  What Year? 0 points 0 points  What month? 0 points 0 points  What time? 0 points 0 points  Count back from 20 0 points 0 points  Months in reverse 0 points 4 points  Repeat phrase 2 points 0 points  Total Score 2 points 4 points    Immunizations Immunization History  Administered Date(s) Administered   Fluad Quad(high Dose 65+) 07/19/2021   Influenza Split 07/22/2012   Influenza, High Dose Seasonal PF 06/10/2019   Influenza,inj,Quad PF,6+ Mos 10/09/2014, 07/13/2015, 11/17/2016, 07/04/2017, 07/10/2018   Influenza-Unspecified 06/20/2020, 07/14/2022   Moderna Sars-Covid-2 Vaccination 11/27/2019, 12/26/2019, 06/22/2020, 12/30/2020, 06/21/2021   Pneumococcal Conjugate-13 03/23/2019   Pneumococcal Polysaccharide-23 08/22/2020   Pneumococcal-Unspecified 02/12/2010   Tdap 02/12/2010, 10/17/2016   Zoster Recombinant(Shingrix) 02/20/2021    TDAP status: Up to date  Flu Vaccine status: Due, Education has been provided regarding the importance of this vaccine. Advised may receive this vaccine at local pharmacy or Health Dept. Aware to provide a copy of the vaccination record if obtained from local pharmacy or Health Dept. Verbalized acceptance and understanding.  Pneumococcal vaccine status: Up to date  Covid-19 vaccine status: Information provided on how to obtain vaccines.   Qualifies for Shingles Vaccine? Yes  Zostavax completed No   Shingrix Completed?: No.    Education has been provided regarding the importance of this vaccine. Patient has been advised to call insurance company to determine out of pocket expense if they have not yet received this vaccine. Advised may also receive vaccine at local pharmacy or Health Dept. Verbalized acceptance and understanding.  Screening Tests Health Maintenance  Topic Date Due   Zoster Vaccines- Shingrix (2 of 2) 04/17/2021   Medicare Annual Wellness (AWV)  06/15/2023   COVID-19 Vaccine (6 - 2023-24 season) 06/16/2023   INFLUENZA VACCINE  01/13/2024 (Originally 05/16/2023)   HEMOGLOBIN A1C  10/23/2023   OPHTHALMOLOGY EXAM  03/21/2024   FOOT EXAM  04/21/2024   Diabetic kidney evaluation - eGFR measurement  05/14/2024   Diabetic kidney evaluation - Urine ACR  05/14/2024   MAMMOGRAM  06/13/2024   Colonoscopy  08/23/2026   DTaP/Tdap/Td (3 - Td or Tdap) 10/17/2026   Pneumonia Vaccine 59+ Years old  Completed   DEXA SCAN  Completed   Hepatitis C Screening  Completed   HPV VACCINES  Aged Out    Health Maintenance  Health Maintenance Due  Topic Date Due   Zoster Vaccines- Shingrix (2 of 2) 04/17/2021   Medicare Annual Wellness (AWV)  06/15/2023   COVID-19 Vaccine (6 - 2023-24 season) 06/16/2023    Colorectal cancer screening: Type of screening: Colonoscopy. Completed 08/23/16. Repeat every 10 years  Mammogram status: Completed 06/13/22. Repeat every year Pt scheduled for 07/05/23  Bone Density status: Ordered 06/20/23. Pt provided with contact info and advised to call to schedule appt.  Lung Cancer Screening: (Low Dose CT Chest recommended if Age 48-80 years, 20 pack-year currently smoking OR have quit w/in 15years.) does not qualify.   Additional Screening:  Hepatitis C Screening: does qualify; Completed 03/25/21  Vision Screening: Recommended annual ophthalmology exams for early detection of glaucoma and other disorders of the eye. Is the patient up  to date with their annual eye exam?  Yes  Who is the provider or what is the name of the office in which the patient attends annual eye exams? [facility] If pt is not established with a provider, would they like to be referred to a provider to establish care? No .   Dental Screening: Recommended annual dental exams for proper oral hygiene  Diabetic Foot Exam: Diabetic Foot Exam: Completed 04/22/23  Community Resource Referral / Chronic Care Management: CRR required this visit?  No   CCM required this visit?  No     Plan:     I have personally reviewed and noted the following in the patient's chart:   Medical and social history Use of alcohol, tobacco or illicit drugs  Current medications and supplements including opioid prescriptions. Patient is not currently taking opioid prescriptions. Functional ability and status Nutritional status Physical activity Advanced directives List of other physicians Hospitalizations, surgeries, and ER visits in previous 12 months Vitals Screenings to include cognitive, depression, and falls Referrals and appointments  In addition, I have reviewed and discussed with patient certain preventive protocols, quality metrics, and best practice recommendations. A written personalized care plan for preventive services as well as general preventive health recommendations were provided to patient.     Donne Anon, CMA   06/20/2023   After Visit Summary: (MyChart) Due to this being a telephonic visit, the after visit summary with patients personalized plan was offered to patient via MyChart   Nurse Notes: None

## 2023-06-20 NOTE — Patient Instructions (Signed)
Lynn [name] , Thank you for taking time to come for your Medicare Wellness Visit. I appreciate your ongoing commitment to your health goals. Please review the following plan we discussed and let me know if I can assist you in the future.     This is a list of the screening recommended for you and due dates:  Health Maintenance  Topic Date Due   Zoster (Shingles) Vaccine (2 of 2) 04/17/2021   COVID-19 Vaccine (6 - 2023-24 season) 06/16/2023   Flu Shot  01/13/2024*   Hemoglobin A1C  10/23/2023   Eye exam for diabetics  03/21/2024   Complete foot exam   04/21/2024   Yearly kidney function blood test for diabetes  05/14/2024   Yearly kidney health urinalysis for diabetes  05/14/2024   Mammogram  06/13/2024   Medicare Annual Wellness Visit  06/19/2024   Colon Cancer Screening  08/23/2026   DTaP/Tdap/Td vaccine (3 - Td or Tdap) 10/17/2026   Pneumonia Vaccine  Completed   DEXA scan (bone density measurement)  Completed   Hepatitis C Screening  Completed   HPV Vaccine  Aged Out  *Topic was postponed. The date shown is not the original due date.    Next appointment: Follow up in one year for your annual wellness visit.   Preventive Care 69 Years and Older, Female Preventive care refers to lifestyle choices and visits with your health care provider that can promote health and wellness. What does preventive care include? A yearly physical exam. This is also called an annual well check. Dental exams once or twice a year. Routine eye exams. Ask your health care provider how often you should have your eyes checked. Personal lifestyle choices, including: Daily care of your teeth and gums. Regular physical activity. Eating a healthy diet. Avoiding tobacco and drug use. Limiting alcohol use. Practicing safe sex. Taking low-dose aspirin every day. Taking vitamin and mineral supplements as recommended by your health care provider. What happens during an annual well check? The services and  screenings done by your health care provider during your annual well check will depend on your age, overall health, lifestyle risk factors, and family history of disease. Counseling  Your health care provider may ask you questions about your: Alcohol use. Tobacco use. Drug use. Emotional well-being. Home and relationship well-being. Sexual activity. Eating habits. History of falls. Memory and ability to understand (cognition). Work and work Astronomer. Reproductive health. Screening  You may have the following tests or measurements: Height, weight, and BMI. Blood pressure. Lipid and cholesterol levels. These may be checked every 5 years, or more frequently if you are over 19 years old. Skin check. Lung cancer screening. You may have this screening every year starting at age 31 if you have a 30-pack-year history of smoking and currently smoke or have quit within the past 15 years. Fecal occult blood test (FOBT) of the stool. You may have this test every year starting at age 60. Flexible sigmoidoscopy or colonoscopy. You may have a sigmoidoscopy every 5 years or a colonoscopy every 10 years starting at age 73. Hepatitis C blood test. Hepatitis B blood test. Sexually transmitted disease (STD) testing. Diabetes screening. This is done by checking your blood sugar (glucose) after you have not eaten for a while (fasting). You may have this done every 1-3 years. Bone density scan. This is done to screen for osteoporosis. You may have this done starting at age 83. Mammogram. This may be done every 1-2 years. Talk to your  health care provider about how often you should have regular mammograms. Talk with your health care provider about your test results, treatment options, and if necessary, the need for more tests. Vaccines  Your health care provider may recommend certain vaccines, such as: Influenza vaccine. This is recommended every year. Tetanus, diphtheria, and acellular pertussis (Tdap,  Td) vaccine. You may need a Td booster every 10 years. Zoster vaccine. You may need this after age 47. Pneumococcal 13-valent conjugate (PCV13) vaccine. One dose is recommended after age 88. Pneumococcal polysaccharide (PPSV23) vaccine. One dose is recommended after age 46. Talk to your health care provider about which screenings and vaccines you need and how often you need them. This information is not intended to replace advice given to you by your health care provider. Make sure you discuss any questions you have with your health care provider. Document Released: 10/28/2015 Document Revised: 06/20/2016 Document Reviewed: 08/02/2015 Elsevier Interactive Patient Education  2017 [employer].  Fall Prevention in the Home Falls can cause injuries. They can happen to people of all ages. There are many things you can do to make your home safe and to help prevent falls. What can I do on the outside of my home? Regularly fix the edges of walkways and driveways and fix any cracks. Remove anything that might make you trip as you walk through a door, such as a raised step or threshold. Trim any bushes or trees on the path to your home. Use bright outdoor lighting. Clear any walking paths of anything that might make someone trip, such as rocks or tools. Regularly check to see if handrails are loose or broken. Make sure that both sides of any steps have handrails. Any raised decks and porches should have guardrails on the edges. Have any leaves, snow, or ice cleared regularly. Use sand or salt on walking paths during winter. Clean up any spills in your garage right away. This includes oil or grease spills. What can I do in the bathroom? Use night lights. Install grab bars by the toilet and in the tub and shower. Do not use towel bars as grab bars. Use non-skid mats or decals in the tub or shower. If you need to sit down in the shower, use a plastic, non-slip stool. Keep the floor dry. Clean up any  water that spills on the floor as soon as it happens. Remove soap buildup in the tub or shower regularly. Attach bath mats securely with double-sided non-slip rug tape. Do not have throw rugs and other things on the floor that can make you trip. What can I do in the bedroom? Use night lights. Make sure that you have a light by your bed that is easy to reach. Do not use any sheets or blankets that are too big for your bed. They should not hang down onto the floor. Have a firm chair that has side arms. You can use this for support while you get dressed. Do not have throw rugs and other things on the floor that can make you trip. What can I do in the kitchen? Clean up any spills right away. Avoid walking on wet floors. Keep items that you use a lot in easy-to-reach places. If you need to reach something above you, use a strong step stool that has a grab bar. Keep electrical cords out of the way. Do not use floor polish or wax that makes floors slippery. If you must use wax, use non-skid floor wax. Do not have  throw rugs and other things on the floor that can make you trip. What can I do with my stairs? Do not leave any items on the stairs. Make sure that there are handrails on both sides of the stairs and use them. Fix handrails that are broken or loose. Make sure that handrails are as long as the stairways. Check any carpeting to make sure that it is firmly attached to the stairs. Fix any carpet that is loose or worn. Avoid having throw rugs at the top or bottom of the stairs. If you do have throw rugs, attach them to the floor with carpet tape. Make sure that you have a light switch at the top of the stairs and the bottom of the stairs. If you do not have them, ask someone to add them for you. What else can I do to help prevent falls? Wear shoes that: Do not have high heels. Have rubber bottoms. Are comfortable and fit you well. Are closed at the toe. Do not wear sandals. If you use a  stepladder: Make sure that it is fully opened. Do not climb a closed stepladder. Make sure that both sides of the stepladder are locked into place. Ask someone to hold it for you, if possible. Clearly mark and make sure that you can see: Any grab bars or handrails. First and last steps. Where the edge of each step is. Use tools that help you move around (mobility aids) if they are needed. These include: Canes. Walkers. Scooters. Crutches. Turn on the lights when you go into a dark area. Replace any light bulbs as soon as they burn out. Set up your furniture so you have a clear path. Avoid moving your furniture around. If any of your floors are uneven, fix them. If there are any pets around you, be aware of where they are. Review your medicines with your doctor. Some medicines can make you feel dizzy. This can increase your chance of falling. Ask your doctor what other things that you can do to help prevent falls. This information is not intended to replace advice given to you by your health care provider. Make sure you discuss any questions you have with your health care provider. Document Released: 07/28/2009 Document Revised: 03/08/2016 Document Reviewed: 11/05/2014 Elsevier Interactive Patient Education  2017 [employer].

## 2023-06-21 ENCOUNTER — Telehealth (HOSPITAL_BASED_OUTPATIENT_CLINIC_OR_DEPARTMENT_OTHER): Payer: Self-pay

## 2023-06-22 NOTE — Progress Notes (Unsigned)
[facility] [address] [place], [place] [address] [phone]  Date:  06/24/2023   Name:  Lynn [name]   DOB:  06-27-1956   MRN:  [id number]  PCP:  Pearline Cables, MD    Chief Complaint: No chief complaint on file.   History of Present Illness:  Lynn [name] is a 67 y.o. very pleasant female patient who presents with the following:  Patient seen today virtually for concern of anxiety Patient location is home, location is office.  Patient identity confirmed with 2 factors, she gives consent for virtual visit today.  The patient and myself are present on the call today-patient was not able to give me access to her camera, so this was an audio visit only  Most recent visit with myself was in July-at that time she had recently suffered a recurrent GI bleed caused by an ulcer  History of paroxysmal atrial tach and fibrillation, on diltiazem and Eliquis, interstitial lung disease and COPD on oxygen, diabetes, history of CVA, rheumatoid arthritis, hypertension, thalassemia   She notes anxiety attacks -she has had occasional panic attacks for years, but more frequent as of late She notes that her oxygen level has been dropping more which increases her anxiety She is also in pain a lot of the time due to her RA She is using oxygen at 4L 24/7-this is managed by pulmonology  She notes panic attacks- she will feel "like there is a plastic bag around my head," occur more when she is in more pain or more SOB Her husband will come and try to help her take some deep breaths and calm her down This helps but it may take several minutes for the panic to resolve She may get a panic attack perhaps 4x a week- in the past her attacks were rare  She never had to take any medication for this   In addition, she has noted more difficulty sleeping recently.  She thinks this is due to anxiety sometimes, also her  rheumatoid arthritis is very painful  Lab Results  Component Value Date   HGBA1C 6.8 (A) 04/22/2023    Patient Active Problem List   Diagnosis Date Noted   ABLA (acute blood loss anemia) 12/15/2022   Acute upper GI bleed 09/04/2022   IDA (iron deficiency anemia) 02/09/2022   Acute on chronic respiratory failure with hypoxia (HCC) 11/14/2021   AKI (acute kidney injury) (HCC)    Syncope 11/13/2021   Acute gastric ulcer without hemorrhage or perforation    Ulcer of esophagus without bleeding    PAF (paroxysmal atrial fibrillation) (HCC); CHA2DS2-VASc score 7 07/05/2021   Anemia    Acute gastric ulcer with hemorrhage    Occult GI bleeding    Melena    Preop respiratory exam    ILD (interstitial lung disease) (HCC)    SOB (shortness of breath) 03/24/2021   FUO (fever of unknown origin) 03/24/2021   Hypokalemia 03/24/2021   Generalized weakness 03/24/2021   Coronary artery spasm (HCC) 08/09/2020   Rheumatoid arthritis (HCC) 11/09/2019   Toe pain, bilateral 05/26/2019   Type 2 diabetes mellitus with hyperlipidemia (HCC) 05/26/2019   Mobility impaired 04/01/2019   Class 3 obesity (HCC) 12/03/2017   DOE (dyspnea on exertion) 12/02/2017   Moderate mitral stenosis by prior echocardiography 08/23/2017   Chronic diastolic CHF (congestive heart failure) (HCC) 08/12/2017   Chronic respiratory failure with hypoxia (HCC) 08/12/2017  OSA on CPAP 08/12/2017   COPD with acute bronchitis (HCC) 08/12/2017   Near syncope 07/03/2017   Anxiety 07/03/2017   Coronary artery disease, non-occlusive 12/08/2016   PAT (paroxysmal atrial tachycardia)    H/O total knee replacement 08/19/2015   Vision, loss, sudden 03/07/2015   S/P TKR (total knee replacement) using cement 02/18/2015   Thalassemia 09/02/2013   Hyperlipidemia associated with type 2 diabetes mellitus (HCC) 08/24/2012   Essential hypertension 07/22/2012    Past Medical History:  Diagnosis Date   Allergy 2018   Anxiety    CHF  (congestive heart failure) (HCC)    Clotting disorder (HCC)    blood clot in eye 2016   COPD (chronic obstructive pulmonary disease) (HCC)    Coronary artery disease, non-occlusive    a. 11/2016 NSTEMI/Cath: LM nl, LAD 40p, 50md, D1/2 small, LCX 40ost, OM2/3 nl/small, RCA 35 ost/mid, RPDA/RPL small/nl.   Coronary vasospasm (HCC)    Depression    Diastolic dysfunction    a. 11/2016 Echo: EF 55-60%, gr1 DD, sev calcified MV annulus, mildly dil LA.   Eye hemorrhage 01/2015   "right; resolved" (07/03/2017)   Gastric ulcer    GERD (gastroesophageal reflux disease)    Headache    "monthly" (07/03/2017)   Heart murmur    History of blood transfusion    "when I had an ectopic pregnancy"   History of stomach ulcers    Hyperlipidemia    Hypertension    Microcytic anemia    Moderate mitral stenosis by prior echocardiogram 03/2021   TTE: Mod MS (mean gradient 9 mmHg). -- TEE Moderate-Severe MS (mean gradient 14 mmHg) with fixed Post MV Leaflet & Severe MAC w/ large circumscribed calcified mass on the Post MV Leaflet. (Mass previously described in 2018) - CMRI identifies calcified mass & not Tumor.; R&LHC - LVEDP & PCWP both 23 mmHg indicating minimal resting gradient   Myocardial infarction (HCC) 2023   OSA on CPAP    setting is unknown   Oxygen deficiency    PAF (paroxysmal atrial fibrillation) (HCC) 03/2021   Event Monitor Aug-Sept 2022: Predominantly sinus rhythm.  1% A. fib burden.  30 brief episodes of PAT.  2 short bursts of NSVT.   PAT (paroxysmal atrial tachycardia)    Event monitor from August 2022 showed 30 brief bursts longest 14 seconds.   Pneumonia    "several times" (07/03/2017)   PVC's (premature ventricular contractions)    Rheumatoid arthritis (HCC)    Sickle cell anemia (HCC)    Sleep apnea    Stroke (HCC)    Syncope 07/03/2017 X 2   called seizures but no medications   Thalassemia    "my cells are sickle cell shaped but I don't have sickle cell anemia" (07/03/2017)   Type  II diabetes mellitus (HCC)     Past Surgical History:  Procedure Laterality Date   48-Hour Monitor  03/18/2017   Sinus rhythm with sinus tachycardia (rate 58-134 BPM)multiple PVCs noted with couplets and bigeminy. One triplet. 7 runs of PAT ranging from 100-130 bpm. Longest was 33 beats.   BIOPSY  04/03/2021   Procedure: BIOPSY;  Surgeon: Benancio Deeds, MD;  Location: [facility];  Service: Gastroenterology;;   BIOPSY  12/17/2022   Procedure: BIOPSY;  Surgeon: Hilarie Fredrickson, MD;  Location: [facility];  Service: Gastroenterology;;   BREAST BIOPSY Left    BRONCHIAL WASHINGS  03/28/2021   Procedure: BRONCHIAL WASHINGS;  Surgeon: Steffanie Dunn, DO;  Location: [facility];  Service: Endoscopy;;   CARDIAC CATHETERIZATION N/A 11/19/2016   Procedure: Left Heart Cath and Coronary Angiography;  Surgeon: Lyn Records, MD;  Location: [facility] INVASIVE CV LAB;  Service: Cardiovascular.    LM nl, LAD 40p, 50md, D1/2 small, LCX 40ost, OM2/3 nl/small, RCA 35 ost/mid, RPDA/RPL small/nl.   CARDIAC MRI  03/30/2021   Normal LVEF ~53%. 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 No Scar or Late Gadolinium Enhancement on LV Myocardium.   CARPAL TUNNEL RELEASE Right 11/01/2014   COLONOSCOPY     DILATION AND CURETTAGE OF UTERUS     ECTOPIC PREGNANCY SURGERY  X 2   ENTEROSCOPY N/A 09/05/2022   Procedure: ENTEROSCOPY;  Surgeon: Meridee Score Netty Starring., MD;  Location: [facility];  Service: Gastroenterology;  Laterality: N/A;   ESOPHAGOGASTRODUODENOSCOPY N/A 12/17/2022   Procedure: ESOPHAGOGASTRODUODENOSCOPY (EGD);  Surgeon: Hilarie Fredrickson, MD;  Location: [facility];  Service: Gastroenterology;  Laterality: N/A;   ESOPHAGOGASTRODUODENOSCOPY (EGD) WITH PROPOFOL N/A 04/03/2021   Procedure: ESOPHAGOGASTRODUODENOSCOPY (EGD) WITH PROPOFOL;  Surgeon: Benancio Deeds, MD;  Location: [facility];  Service: Gastroenterology;  Laterality: N/A;   ESOPHAGOGASTRODUODENOSCOPY  (EGD) WITH PROPOFOL N/A 08/15/2021   Procedure: ESOPHAGOGASTRODUODENOSCOPY (EGD) WITH PROPOFOL;  Surgeon: Meryl Dare, MD;  Location: [facility];  Service: Endoscopy;  Laterality: N/A;   HOT HEMOSTASIS N/A 09/05/2022   Procedure: HOT HEMOSTASIS (ARGON PLASMA COAGULATION/BICAP);  Surgeon: Lemar Lofty., MD;  Location: [facility];  Service: Gastroenterology;  Laterality: N/A;   JOINT REPLACEMENT     KNEE ARTHROSCOPY Left 11/2014   RIGHT/LEFT HEART CATH AND CORONARY ANGIOGRAPHY N/A 08/08/2021   Procedure: RIGHT/LEFT HEART CATH AND CORONARY ANGIOGRAPHY;  Surgeon: Marykay Lex, MD;  Location: [facility] INVASIVE CV LAB;  Service: Cardiovascular;Ost LCx 40%. Ost-Prox RCA 35%. Prox-Mid LAD 25%-<25% D2. Mod-Severely Elevated LVEDP. Mild PA HTN -> PA mean 31 mmHg with a PCWP and LVEDP of 23 mmHg   SUBMUCOSAL TATTOO INJECTION  09/05/2022   Procedure: SUBMUCOSAL TATTOO INJECTION;  Surgeon: Lemar Lofty., MD;  Location: [facility];  Service: Gastroenterology;;   TEE WITHOUT CARDIOVERSION N/A 03/28/2021   Procedure: TRANSESOPHAGEAL ECHOCARDIOGRAM (TEE);  Surgeon: Vesta Mixer, MD;  Location: [facility] ENDOSCOPY;; LVEF 55-60%. Normal LV Fxn & no RWMA. No LAA thrombus. Gr II Ao Plaque. Large circumscribed calcific mass (1.8 x 1.4 cm) anterior to posterior annulus.  Fixed posterior leaflet with Mod-Severe MS (Mean MVG ~14 mmHg, Peak 20 mmhg).  Mild MR   TONSILLECTOMY     TOTAL KNEE ARTHROPLASTY Right 02/18/2015   Procedure: RIGHT TOTAL KNEE ARTHROPLASTY;  Surgeon: Beverely Low, MD;  Location: [facility] OR;  Service: Orthopedics;  Laterality: Right;   TOTAL KNEE ARTHROPLASTY Left 08/19/2015   Procedure: LEFT TOTAL KNEE ARTHROPLASTY;  Surgeon: Beverely Low, MD;  Location: [facility] OR;  Service: Orthopedics;  Laterality: Left;   TRANSTHORACIC ECHOCARDIOGRAM  11/2016; 07/2017:   a) normal LV size, thickness and function. EF 55-60%. GR 1 DD.No RWMA severe mitral annular calcification, but no mitral  stenosis. Mild LA dilation;; b) normal LV size and function.  EF 65-75%.  Unable to assess diastolic function.  Aortic sclerosis with no stenosis.  Moderate mitral stenosis? (Gradient 9 mmHg) -?  Mild-mod left atrial enlargement    TRANSTHORACIC ECHOCARDIOGRAM  01/2019   EF 65 to 70%.  Normal function.  Elevated LVEDP-GR 1 DD.  Normal RV size and function.  Large calcific mass on posterior mitral leaflet mild to moderate mitral stenosis.   TRANSTHORACIC ECHOCARDIOGRAM  03/24/2021   EF 60 to 65%.  LV function with normal wall motion.  Moderate basal septal LVH.  GRII DD & Mild LA dilation.-> elevated LVEDP.  Normal RV function.  Mildly elevated PAP.  Ill-defined density measuring 3.23 x 2.1 cm region of the posterior MV leaflet along with dense MAC.  Moderate MS (mean MVG of 9 mmHg.) Normal AoV & RAP. --> Recommend TEE 2/2 concern for MV SBE.   TRANSTHORACIC ECHOCARDIOGRAM  04/26/2022   EF 60 to 65%.  No RWMA.  Moderate LVH with GR 1 DD.  Normal RV size and function.  Mild LA dilation.  Large calcific mass of posterior MV leaflet.  Mean PG 13 mm 3.  Trivial MR.  Mild to moderate MS.  Severe MAC.  Normal AOV.  Normal RAP.   VAGINAL HYSTERECTOMY     VIDEO BRONCHOSCOPY N/A 03/28/2021   Procedure: VIDEO BRONCHOSCOPY WITHOUT FLUORO;  Surgeon: Steffanie Dunn, DO;  Location: [facility] ENDOSCOPY;  Service: Endoscopy;  Laterality: N/A;    Social History   Tobacco Use   Smoking status: Former    Current packs/day: 0.00    Average packs/day: 0.3 packs/day for 44.0 years (11.0 ttl pk-yrs)    Types: Cigarettes    Start date: 02/17/1971    Quit date: 02/17/2015    Years since quitting: 8.3   Smokeless tobacco: Never  Vaping Use   Vaping status: Never Used  Substance Use Topics   Alcohol use: Not Currently    Comment: 07/03/2017 "might have a few drinks/year"   Drug use: No    Family History  Problem Relation Age of Onset   Cancer Mother    Diabetes Mother    Hypertension Mother    Hyperlipidemia Mother     Cancer Father    Hyperlipidemia Father    Mental illness Sister    Diabetes Sister    Diabetes Maternal Grandmother    Diabetes Maternal Grandfather    Colon cancer Neg Hx    Esophageal cancer Neg Hx    Stomach cancer Neg Hx    Rectal cancer Neg Hx    Tremor Neg Hx    Parkinson's disease Neg Hx     Allergies  Allergen Reactions   Methotrexate Other (See Comments)    Pneumonitis   Penicillins Hives    Childhood allergy Has patient had a PCN reaction causing immediate rash, facial/tongue/throat swelling, SOB or lightheadedness with hypotension: Yes Has patient had a PCN reaction causing severe rash involving mucus membranes or skin necrosis: No Has patient had a PCN reaction that required hospitalization No Has patient had a PCN reaction occurring within the last 10 years: No If all of the above answers are "NO", then may proceed with Cephalosporin use.   Marcelline Deist [Dapagliflozin] Other (See Comments)    Dizzy and lethargic    Metformin And Related Other (See Comments)    Diarrhea, bleeding    Medication list has been reviewed and updated.  Current Outpatient Medications on File Prior to Visit  Medication Sig Dispense Refill   acetaminophen (TYLENOL) 500 MG tablet Take 500 mg by mouth every 6 (six) hours as needed for fever or moderate pain.     albuterol (PROVENTIL) (2.5 MG/3ML) 0.083% nebulizer solution Take 3 mLs (2.5 mg total) by nebulization every 6 (six) hours as needed for wheezing or shortness of breath. 120 mL 5   albuterol (VENTOLIN HFA) 108 (90 Base) MCG/ACT inhaler TAKE 2 PUFFS BY MOUTH EVERY 6 HOURS AS NEEDED FOR WHEEZE  OR SHORTNESS OF BREATH 8.5 each 2   apixaban (ELIQUIS) 5 MG TABS tablet Take 1 tablet (5 mg total) by mouth 2 (two) times daily. Resume taking on 12/19/22 180 tablet 3   atorvastatin (LIPITOR) 80 MG tablet Take 1 tablet (80 mg total) by mouth every evening. 90 tablet 3   cetirizine (ZYRTEC) 10 MG tablet Take 10 mg by mouth daily as needed for allergies.      Continuous Blood Gluc Transmit (DEXCOM G6 TRANSMITTER) MISC 1 Device by Does not apply route as directed. 1 each 3   Continuous Glucose Sensor (DEXCOM G7 SENSOR) MISC 1 Device by Does not apply route as directed. 9 each 3   diltiazem (CARDIZEM CD) 360 MG 24 hr capsule Take 1 capsule (360 mg total) by mouth daily. 90 capsule 1   folic acid (FOLVITE) 1 MG tablet Take 2 tablets (2 mg total) by mouth daily. (Patient taking differently: Take 1 mg by mouth in the morning and at bedtime.) 180 tablet 3   furosemide (LASIX) 20 MG tablet DECREASE LASIX ( FUROSEMIDE) TO 20 MG ( 1/2/TABLET) , WITH 20 MG YOU MAY TAKE A AN ADDITIONAL 20 MG IF NEED FOR SWELLING OR WEIGHT GAIN MORE THAN 3 LBS OVERNIGHT OR 5 LBS IN ONE WEEK -- IF YOU HAVE CONTINUE TO TAKE AN ADDITIONAL 20 MG , GO BACK TO 40 MG TAKE ADDITIONAL DOSE AS NEEDED FOR EGT GAIN> 3 LB IN 1 DAY 90 tablet 1   glucose blood (CONTOUR NEXT TEST) test strip 3x daily 300 each 11   hydroxychloroquine (PLAQUENIL) 200 MG tablet Take 200 mg by mouth 2 (two) times daily.     Insulin Disposable Pump (OMNIPOD 5 G7 INTRO, GEN 5,) KIT 1 Device by Does not apply route every other day. 1 kit 0   Insulin Disposable Pump (OMNIPOD 5 G7 PODS, GEN 5,) MISC 1 Device by Does not apply route every other day. 45 each 3   insulin lispro (HUMALOG) 100 UNIT/ML injection Max Daily 100 units per pump 90 mL 3   Insulin Pen Needle (B-D UF III MINI PEN NEEDLES) 31G X 5 MM MISC USE DAILY WITH VICTOZA AND LANTUS. (Patient taking differently: Use for ozempic) 400 each 1   Insulin Pen Needle 31G X 5 MM MISC 1 Device by Does not apply route 3 (three) times daily. 150 each 11   isosorbide mononitrate (IMDUR) 30 MG 24 hr tablet Take 1 tablet (30 mg total) by mouth daily. 90 tablet 3   ketoconazole (NIZORAL) 2 % cream Apply 1 Application topically daily. 60 g 2   Lancets 30G MISC 1 Device by Does not apply route 3 (three) times daily. 300 each 9   leflunomide (ARAVA) 20 MG tablet Take 20 mg by  mouth daily.     losartan (COZAAR) 100 MG tablet Take 0.5 tablets (50 mg total) by mouth daily. 90 tablet 3   Melatonin 5 MG CHEW Chew 5 mg by mouth at bedtime.     Multiple Vitamin (MULTIVITAMIN WITH MINERALS) TABS tablet Take 1 tablet by mouth daily.     Oxymetazoline HCl (VICKS SINEX NA) Place 1 spray into the nose daily as needed (congestion).     pantoprazole (PROTONIX) 40 MG tablet Take 1 tablet (40 mg total) by mouth daily. 30 tablet 11   SAFETY-LOK TB SYRINGE 27GX.5" 27G X 1/2" 1 ML MISC      tirzepatide (MOUNJARO) 10 MG/0.5ML Pen Inject 10 mg into the skin once a week. 6 mL  3   traZODone (DESYREL) 50 MG tablet Take 0.5-1 tablets (25-50 mg total) by mouth at bedtime as needed. for sleep 90 tablet 3   No current facility-administered medications on file prior to visit.    Review of Systems:  As per HPI- otherwise negative.   Physical Examination: There were no vitals filed for this visit. There were no vitals filed for this visit. There is no height or weight on file to calculate BMI. Ideal Body Weight:    Connected with pt via video monitor She did not check vitals this am 240lbs this am  Wt Readings from Last 3 Encounters:  06/19/23 242 lb (109.8 kg)  05/15/23 235 lb 12.8 oz (107 kg)  04/22/23 234 lb (106.1 kg)     Assessment and Plan: Panic attack - Plan: ALPRAZolam (XANAX) 0.25 MG tablet  Virtual visit today to discuss panic attacks.  Patient has long history of oxygen dependence due to interstitial lung disease and COPD, she notes her anxiety symptoms have been getting worse-she thinks potentially related to her breathing.  They have tried conservative methods of dealing with her anxiety but the panic attacks are very bothersome.  We discussed options, she would like to try alprazolam We discussed how to use this medication safely, I advised her it can be habit-forming.  Advised her to use as little of this as she can.  She will try this medication and keep me  posted  Spoke with patient today for 12 minutes  Signed Abbe Amsterdam, MD

## 2023-06-24 ENCOUNTER — Telehealth (INDEPENDENT_AMBULATORY_CARE_PROVIDER_SITE_OTHER): Payer: Medicare Other | Admitting: Family Medicine

## 2023-06-24 DIAGNOSIS — F410 Panic disorder [episodic paroxysmal anxiety] without agoraphobia: Secondary | ICD-10-CM

## 2023-06-24 MED ORDER — ALPRAZOLAM 0.25 MG PO TABS
0.2500 mg | ORAL_TABLET | Freq: Two times a day (BID) | ORAL | 1 refills | Status: DC | PRN
Start: 2023-06-24 — End: 2023-10-07

## 2023-06-25 DIAGNOSIS — E1165 Type 2 diabetes mellitus with hyperglycemia: Secondary | ICD-10-CM | POA: Diagnosis not present

## 2023-06-25 LAB — MICROALBUMIN / CREATININE URINE RATIO: Microalb Creat Ratio: 29.99

## 2023-07-01 ENCOUNTER — Other Ambulatory Visit: Payer: Self-pay

## 2023-07-01 MED ORDER — OMNIPOD 5 G7 PODS (GEN 5) MISC: 1.0000 | 3 refills | Status: DC

## 2023-07-05 ENCOUNTER — Encounter: Payer: Self-pay | Admitting: Family

## 2023-07-05 ENCOUNTER — Ambulatory Visit
Admission: RE | Admit: 2023-07-05 | Discharge: 2023-07-05 | Disposition: A | Discharge status: Home / Self Care | Payer: Medicare Other | Source: Ambulatory Visit | Attending: Family Medicine | Admitting: Family Medicine

## 2023-07-05 DIAGNOSIS — Z1231 Encounter for screening mammogram for malignant neoplasm of breast: Secondary | ICD-10-CM | POA: Diagnosis not present

## 2023-07-11 ENCOUNTER — Ambulatory Visit (INDEPENDENT_AMBULATORY_CARE_PROVIDER_SITE_OTHER): Payer: Medicare Other | Admitting: Otolaryngology

## 2023-07-11 ENCOUNTER — Encounter (INDEPENDENT_AMBULATORY_CARE_PROVIDER_SITE_OTHER): Payer: Self-pay | Admitting: Otolaryngology

## 2023-07-11 VITALS — BP 164/81 | HR 101 | Ht 63.0 in | Wt 237.0 lb

## 2023-07-11 DIAGNOSIS — Z9981 Dependence on supplemental oxygen: Secondary | ICD-10-CM | POA: Diagnosis not present

## 2023-07-11 DIAGNOSIS — Z7901 Long term (current) use of anticoagulants: Secondary | ICD-10-CM

## 2023-07-11 DIAGNOSIS — R040 Epistaxis: Secondary | ICD-10-CM

## 2023-07-11 DIAGNOSIS — R0981 Nasal congestion: Secondary | ICD-10-CM

## 2023-07-11 MED ORDER — OXYMETAZOLINE HCL 0.05 % NA SOLN: 2.0000 | Freq: Two times a day (BID) | NASAL | 0 refills | Status: DC | PRN

## 2023-07-11 MED ORDER — SALINE SPRAY 0.65 % NA SOLN: 1.0000 | NASAL | 5 refills | Status: DC | PRN

## 2023-07-11 NOTE — Progress Notes (Addendum)
ENT CONSULT:  Reason for Consult: recurrent nose bleeds   HPI: Lynn [name] is an 67 y.o. female with hx of COPD/ILD on O2 supplementation, and followed by pulmonary, history of CAD and MI, history of CHF on Eliquis, here for recurrent nosebleeds from the left side of her nose.  Frequent nose bleeds, last one 2 weeks ago. She is on Eliquis. Left side is the source. She never had nasal packing or nasal cautery.  She also has history of OSA previously on CPAP therapy, but currently not using CPAP if this imaging is not working, seeing Dr. Craige Cotta for OSA.   COPD and on 24/7 O2  Records Reviewed:  05/15/2023 office note by Warner Mccreedy, MD Lynn [name] is a 67 y.o. very pleasant female patient who presents with the following:   Pt seen today for med follow up- history of paroxysmal atrial tach and fibrillation, on diltiazem and Eliquis, interstitial lung disease and COPD on oxygen, diabetes, history of CVA, rheumatoid arthritis, hypertension, thalassemia  Last seen by myself in November  Just before our last visit in November she had an upper GI bleed; unfortunate this happened again just recently She was seen by pulmonology in March and was found to have a Hg of 6.2 on routine lab screening-was admitted    Past Medical History:  Diagnosis Date   Allergy 2018   Anxiety    CHF (congestive heart failure) (HCC)    Clotting disorder (HCC)    blood clot in eye 2016   COPD (chronic obstructive pulmonary disease) (HCC)    Coronary artery disease, non-occlusive    a. 11/2016 NSTEMI/Cath: LM nl, LAD 40p, 50md, D1/2 small, LCX 40ost, OM2/3 nl/small, RCA 35 ost/mid, RPDA/RPL small/nl.   Coronary vasospasm (HCC)    Depression    Diastolic dysfunction    a. 11/2016 Echo: EF 55-60%, gr1 DD, sev calcified MV annulus, mildly dil LA.   Eye hemorrhage 01/2015   "right; resolved" (07/03/2017)   Gastric ulcer    GERD (gastroesophageal reflux disease)    Headache    "monthly"  (07/03/2017)   Heart murmur    History of blood transfusion    "when I had an ectopic pregnancy"   History of stomach ulcers    Hyperlipidemia    Hypertension    Microcytic anemia    Moderate mitral stenosis by prior echocardiogram 03/2021   TTE: Mod MS (mean gradient 9 mmHg). -- TEE Moderate-Severe MS (mean gradient 14 mmHg) with fixed Post MV Leaflet & Severe MAC w/ large circumscribed calcified mass on the Post MV Leaflet. (Mass previously described in 2018) - CMRI identifies calcified mass & not Tumor.; R&LHC - LVEDP & PCWP both 23 mmHg indicating minimal resting gradient   Myocardial infarction (HCC) 2023   OSA on CPAP    setting is unknown   Oxygen deficiency    PAF (paroxysmal atrial fibrillation) (HCC) 03/2021   Event Monitor Aug-Sept 2022: Predominantly sinus rhythm.  1% A. fib burden.  30 brief episodes of PAT.  2 short bursts of NSVT.   PAT (paroxysmal atrial tachycardia)    Event monitor from August 2022 showed 30 brief bursts longest 14 seconds.   Pneumonia    "several times" (07/03/2017)   PVC's (premature ventricular contractions)    Rheumatoid arthritis (HCC)    Sickle cell anemia (HCC)    Sleep apnea    Stroke (HCC)    Syncope 07/03/2017 X 2   called seizures but no medications   Thalassemia    "  my cells are sickle cell shaped but I don't have sickle cell anemia" (07/03/2017)   Type II diabetes mellitus (HCC)     Past Surgical History:  Procedure Laterality Date   48-Hour Monitor  03/18/2017   Sinus rhythm with sinus tachycardia (rate 58-134 BPM)multiple PVCs noted with couplets and bigeminy. One triplet. 7 runs of PAT ranging from 100-130 bpm. Longest was 33 beats.   BIOPSY  04/03/2021   Procedure: BIOPSY;  Surgeon: Benancio Deeds, MD;  Location: [facility];  Service: Gastroenterology;;   BIOPSY  12/17/2022   Procedure: BIOPSY;  Surgeon: Hilarie Fredrickson, MD;  Location: [facility];  Service: Gastroenterology;;   BREAST BIOPSY Left    BRONCHIAL WASHINGS   03/28/2021   Procedure: BRONCHIAL WASHINGS;  Surgeon: Steffanie Dunn, DO;  Location: [facility];  Service: Endoscopy;;   CARDIAC CATHETERIZATION N/A 11/19/2016   Procedure: Left Heart Cath and Coronary Angiography;  Surgeon: Lyn Records, MD;  Location: [facility] INVASIVE CV LAB;  Service: Cardiovascular.    LM nl, LAD 40p, 50md, D1/2 small, LCX 40ost, OM2/3 nl/small, RCA 35 ost/mid, RPDA/RPL small/nl.   CARDIAC MRI  03/30/2021   Normal LVEF ~53%. 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 No Scar or Late Gadolinium Enhancement on LV Myocardium.   CARPAL TUNNEL RELEASE Right 11/01/2014   COLONOSCOPY     DILATION AND CURETTAGE OF UTERUS     ECTOPIC PREGNANCY SURGERY  X 2   ENTEROSCOPY N/A 09/05/2022   Procedure: ENTEROSCOPY;  Surgeon: Meridee Score Netty Starring., MD;  Location: [facility];  Service: Gastroenterology;  Laterality: N/A;   ESOPHAGOGASTRODUODENOSCOPY N/A 12/17/2022   Procedure: ESOPHAGOGASTRODUODENOSCOPY (EGD);  Surgeon: Hilarie Fredrickson, MD;  Location: [facility];  Service: Gastroenterology;  Laterality: N/A;   ESOPHAGOGASTRODUODENOSCOPY (EGD) WITH PROPOFOL N/A 04/03/2021   Procedure: ESOPHAGOGASTRODUODENOSCOPY (EGD) WITH PROPOFOL;  Surgeon: Benancio Deeds, MD;  Location: [facility];  Service: Gastroenterology;  Laterality: N/A;   ESOPHAGOGASTRODUODENOSCOPY (EGD) WITH PROPOFOL N/A 08/15/2021   Procedure: ESOPHAGOGASTRODUODENOSCOPY (EGD) WITH PROPOFOL;  Surgeon: Meryl Dare, MD;  Location: [facility];  Service: Endoscopy;  Laterality: N/A;   HOT HEMOSTASIS N/A 09/05/2022   Procedure: HOT HEMOSTASIS (ARGON PLASMA COAGULATION/BICAP);  Surgeon: Lemar Lofty., MD;  Location: [facility];  Service: Gastroenterology;  Laterality: N/A;   JOINT REPLACEMENT     KNEE ARTHROSCOPY Left 11/2014   RIGHT/LEFT HEART CATH AND CORONARY ANGIOGRAPHY N/A 08/08/2021   Procedure: RIGHT/LEFT HEART CATH AND CORONARY ANGIOGRAPHY;  Surgeon: Marykay Lex, MD;  Location: [facility] INVASIVE CV LAB;  Service: Cardiovascular;Ost LCx 40%. Ost-Prox RCA 35%. Prox-Mid LAD 25%-<25% D2. Mod-Severely Elevated LVEDP. Mild PA HTN -> PA mean 31 mmHg with a PCWP and LVEDP of 23 mmHg   SUBMUCOSAL TATTOO INJECTION  09/05/2022   Procedure: SUBMUCOSAL TATTOO INJECTION;  Surgeon: Lemar Lofty., MD;  Location: [facility];  Service: Gastroenterology;;   TEE WITHOUT CARDIOVERSION N/A 03/28/2021   Procedure: TRANSESOPHAGEAL ECHOCARDIOGRAM (TEE);  Surgeon: Vesta Mixer, MD;  Location: [facility] ENDOSCOPY;; LVEF 55-60%. Normal LV Fxn & no RWMA. No LAA thrombus. Gr II Ao Plaque. Large circumscribed calcific mass (1.8 x 1.4 cm) anterior to posterior annulus.  Fixed posterior leaflet with Mod-Severe MS (Mean MVG ~14 mmHg, Peak 20 mmhg).  Mild MR   TONSILLECTOMY     TOTAL KNEE ARTHROPLASTY Right 02/18/2015   Procedure: RIGHT TOTAL KNEE ARTHROPLASTY;  Surgeon: Beverely Low, MD;  Location: [facility] OR;  Service: Orthopedics;  Laterality: Right;  TOTAL KNEE ARTHROPLASTY Left 08/19/2015   Procedure: LEFT TOTAL KNEE ARTHROPLASTY;  Surgeon: Beverely Low, MD;  Location: [facility] OR;  Service: Orthopedics;  Laterality: Left;   TRANSTHORACIC ECHOCARDIOGRAM  11/2016; 07/2017:   a) normal LV size, thickness and function. EF 55-60%. GR 1 DD.No RWMA severe mitral annular calcification, but no mitral stenosis. Mild LA dilation;; b) normal LV size and function.  EF 65-75%.  Unable to assess diastolic function.  Aortic sclerosis with no stenosis.  Moderate mitral stenosis? (Gradient 9 mmHg) -?  Mild-mod left atrial enlargement    TRANSTHORACIC ECHOCARDIOGRAM  01/2019   EF 65 to 70%.  Normal function.  Elevated LVEDP-GR 1 DD.  Normal RV size and function.  Large calcific mass on posterior mitral leaflet mild to moderate mitral stenosis.   TRANSTHORACIC ECHOCARDIOGRAM  03/24/2021   EF 60 to 65%.  LV function with normal wall motion.  Moderate basal septal LVH.  GRII DD & Mild LA dilation.->  elevated LVEDP.  Normal RV function.  Mildly elevated PAP.  Ill-defined density measuring 3.23 x 2.1 cm region of the posterior MV leaflet along with dense MAC.  Moderate MS (mean MVG of 9 mmHg.) Normal AoV & RAP. --> Recommend TEE 2/2 concern for MV SBE.   TRANSTHORACIC ECHOCARDIOGRAM  04/26/2022   EF 60 to 65%.  No RWMA.  Moderate LVH with GR 1 DD.  Normal RV size and function.  Mild LA dilation.  Large calcific mass of posterior MV leaflet.  Mean PG 13 mm 3.  Trivial MR.  Mild to moderate MS.  Severe MAC.  Normal AOV.  Normal RAP.   VAGINAL HYSTERECTOMY     VIDEO BRONCHOSCOPY N/A 03/28/2021   Procedure: VIDEO BRONCHOSCOPY WITHOUT FLUORO;  Surgeon: Steffanie Dunn, DO;  Location: [facility] ENDOSCOPY;  Service: Endoscopy;  Laterality: N/A;    Family History  Problem Relation Age of Onset   Cancer Mother    Diabetes Mother    Hypertension Mother    Hyperlipidemia Mother    Cancer Father    Hyperlipidemia Father    Mental illness Sister    Diabetes Sister    Diabetes Maternal Grandmother    Diabetes Maternal Grandfather    Colon cancer Neg Hx    Esophageal cancer Neg Hx    Stomach cancer Neg Hx    Rectal cancer Neg Hx    Tremor Neg Hx    Parkinson's disease Neg Hx     Social History:  reports that she quit smoking about 8 years ago. Her smoking use included cigarettes. She started smoking about 52 years ago. She has a 11 pack-year smoking history. She has never used smokeless tobacco. She reports that she does not currently use alcohol. She reports that she does not use drugs.  Allergies:  Allergies  Allergen Reactions   Methotrexate Other (See Comments)    Pneumonitis   Penicillins Hives    Childhood allergy Has patient had a PCN reaction causing immediate rash, facial/tongue/throat swelling, SOB or lightheadedness with hypotension: Yes Has patient had a PCN reaction causing severe rash involving mucus membranes or skin necrosis: No Has patient had a PCN reaction that required  hospitalization No Has patient had a PCN reaction occurring within the last 10 years: No If all of the above answers are "NO", then may proceed with Cephalosporin use.   Marcelline Deist [Dapagliflozin] Other (See Comments)    Dizzy and lethargic    Metformin And Related Other (See Comments)    Diarrhea, bleeding  Medications: I have reviewed the patient's current medications.   The PMH, PSH, Medications, Allergies, and SH were reviewed and updated.  ROS: Constitutional: Negative for fever, weight loss and weight gain. Cardiovascular: Negative for chest pain and dyspnea on exertion. Respiratory: Has baseline shortness of breath with exertion Gastrointestinal: Negative for nausea and vomiting. Neurological: Negative for headaches. Psychiatric: The patient is not nervous/anxious  Blood pressure (!) 164/81, pulse (!) 101, height 5\' 3"  (1.6 m), weight 237 lb (107.5 kg), SpO2 91%.  PHYSICAL EXAM:  Exam: General: Well-developed, well-nourished Communication and Voice: Clear pitch and clarity Respiratory Respiratory effort: Equal inspiration and expiration without stridor Cardiovascular Peripheral Vascular: Warm extremities with equal color/perfusion Eyes: No nystagmus with equal extraocular motion bilaterally Neuro/Psych/Balance: Patient oriented to person, place, and time; Appropriate mood and affect; Gait is intact with no imbalance; Cranial nerves I-XII are intact Head and Face Inspection: Normocephalic and atraumatic without mass or lesion Palpation: Facial skeleton intact without bony stepoffs Salivary Glands: No mass or tenderness Facial Strength: Facial motility symmetric and full bilaterally ENT Pinna: External ear intact and fully developed External canal: Canal is patent with intact skin Tympanic Membrane: Clear and mobile External Nose: No scar or anatomic deformity Internal Nose: Septum is relatively straight.  Area of dried blood and excoriated ulcerated nasal mucosa  along the left anterior septum, no active bleeding, no polyp, or purulence. Mucosal edema and erythema present.  Bilateral inferior turbinate hypertrophy.  Lips, Teeth, and gums: Mucosa and teeth intact and viable TMJ: No pain to palpation with full mobility Oral cavity/oropharynx: No erythema or exudate, no lesions present Nasopharynx: No mass or lesion with intact mucosa Neck Neck and Trachea: Midline trachea without mass or lesion Thyroid: No mass or nodularity Lymphatics: No lymphadenopathy  Procedure:   PROCEDURE NOTE: nasal endoscopy  Preoperative diagnosis: recurrent epistaxis   Postoperative diagnosis: same  Procedure: Diagnostic nasal endoscopy (16109)  Surgeon: Ashok Croon, M.D.  Anesthesia: Topical lidocaine and Afrin  H&P REVIEW: The patient's history and physical were reviewed today prior to procedure. All medications were reviewed and updated as well. Complications: None Condition is stable throughout exam Indications and consent: The patient presents with symptoms of chronic sinusitis not responding to previous therapies. All the risks, benefits, and potential complications were reviewed with the patient preoperatively and informed consent was obtained. The time out was completed with confirmation of the correct procedure.   Procedure: The patient was seated upright in the clinic. Topical lidocaine and Afrin were applied to the nasal cavity. After adequate anesthesia had occurred, the rigid nasal endoscope was passed into the nasal cavity. The nasal mucosa, turbinates, septum, and sinus drainage pathways were visualized bilaterally. This revealed no purulence or significant secretions that might be cultured. There were no polyps or sites of significant inflammation. The mucosa was intact and there was no crusting present. The scope was then slowly withdrawn and the patient tolerated the procedure well. There were no complications or blood loss.  PROCEDURE NOTE:  nasal cautery (CPT [phone]) Preoperative diagnosis: epistaxis left sided  Postoperative diagnosis: same Procedure: nasal cautery, anterior Surgeon: Ashok Croon Anesthesia: Topical lidocaine and Afrin Complications: None Condition is stable throughout exam Indications and consent: The patient presented with symptoms of recurrent epistaxis, not responding to previous therapies. All the risks, benefits, and potential complications were reviewed with the patient preoperatively and informed consent was obtained. The time out was completed with confirmation of the correct procedure.    Procedure: The patient was seated upright in the  clinic. Topical lidocaine and Afrin were applied to the nasal cavity. After adequate anesthesia had occurred, the silver nitrate stick was used to chemically cauterize the superficial vessels on the left side of the septum anteriorly. Bleeding was controlled at this point. the patient tolerated the procedure well. There were no complications or blood loss.   Studies Reviewed:CT chest 11/23/22 Narrative & Impression  CLINICAL DATA:  Interstitial lung disease   EXAM: CT CHEST WITHOUT CONTRAST   TECHNIQUE: Multidetector CT imaging of the chest was performed following the standard protocol without intravenous contrast. High resolution imaging of the lungs, as well as inspiratory and expiratory imaging, was performed.   RADIATION DOSE REDUCTION: This exam was performed according to the departmental dose-optimization program which includes automated exposure control, adjustment of the mA and/or kV according to patient size and/or use of iterative reconstruction technique.   COMPARISON:  High-resolution chest CT dated March 30, 2021   FINDINGS: Cardiovascular: Mild cardiomegaly. No pericardial effusion. Normal caliber thoracic aorta with moderate atherosclerotic disease. Severe three-vessel coronary artery calcifications. Caseous calcification of the mitral  annulus.   Mediastinum/Nodes: Esophagus and thyroid are unremarkable. No pathologically enlarged lymph nodes seen in the chest.   Lungs/Pleura: Central airways are patent. Bilateral mosaic attenuation with evidence of air trapping on expiratory phase imaging. Mild areas of subpleural reticulation with minimal associated traction bronchiectasis and no clear craniocaudal predominance. No evidence of honeycomb change. No consolidation, pleural effusion or pneumothorax.   Upper Abdomen: No acute abnormality.   Musculoskeletal: No chest wall mass or suspicious bone lesions identified.   IMPRESSION: 1. Mild areas of subpleural reticulation with minimal associated traction bronchiectasis and no clear craniocaudal predominance. No evidence of progression when compared with most recent prior. Associated air trapping suggestive of hypersensitivity pneumonitis. Findings are suggestive of an alternative diagnosis (not UIP) per consensus guidelines: Diagnosis of Idiopathic Pulmonary Fibrosis: An Official ATS/ERS/JRS/ALAT Clinical Practice Guideline. Am Rosezetta Schlatter Crit Care Med Vol 198, Iss 5, [id number], Jun 15 2017. 2. Severe three-vessel coronary artery calcifications. 3. Aortic Atherosclerosis (ICD10-I70.0).    Assessment/Plan: Encounter Diagnoses  Name Primary?   Recurrent epistaxis Yes   Nasal congestion    History of home oxygen therapy    Anticoagulated     Recurrent epistaxis -status post cautery with silver nitrate along the left anterior septum which appears to be the source of her bleeding, nasal endoscopy without evidence of any posterior sources of bleeding blood or clot in the posterior nasal cavity or nasopharynx -Counseled the patient on epistaxis prevention  Epistaxis prevention instructions given to the patient: - use nasal saline spray x6/day and Vaseline twice a day to prevent nose bleeds - for active nose bleeds use Afrin and nasal tip pressure x 10 min to stop them -  if nose bleed does not stop with above measures, please go to Emergency Room  - please see your primary care provider to check your blood pressure and make sure it is under control - return for recurrent nose bleeds and we will consider cautery of your nasal blood vessels  - Purchase BleedStop to have at home and help with epistaxis control   I discussed with the patient that she is at high risk of nosebleeds due to chronic oxygen therapy and anticoagulation with Eliquis.   Thank you for allowing me to participate in the care of this patient. Please do not hesitate to contact me with any questions or concerns.   Ashok Croon, MD Otolaryngology [facility] Phone:  [phone] Fax: [phone]    07/11/2023, 6:31 PM

## 2023-07-11 NOTE — Patient Instructions (Signed)
Epistaxis prevention instructions given to the patient: - use nasal saline spray x6/day and Vaseline twice a day to prevent nose bleeds - for active nose bleeds use Afrin and nasal tip pressure x 10 min to stop them - if nose bleed does not stop with above measures, please go to Emergency Room  - please see your primary care provider to check your blood pressure and make sure it is under control - return for recurrent nose bleeds and we will consider cautery of your nasal blood vessels  - Purchase BleedStop to have at home and help with epistaxis control

## 2023-07-12 ENCOUNTER — Encounter: Payer: Self-pay | Admitting: Family Medicine

## 2023-07-12 ENCOUNTER — Other Ambulatory Visit: Payer: Medicare Other

## 2023-07-12 DIAGNOSIS — R195 Other fecal abnormalities: Secondary | ICD-10-CM

## 2023-07-12 DIAGNOSIS — N189 Chronic kidney disease, unspecified: Secondary | ICD-10-CM

## 2023-07-12 NOTE — Addendum Note (Signed)
Addended by: Maximino Sarin on: 07/12/2023 02:51 PM   Modules accepted: Orders

## 2023-07-13 LAB — CBC
HCT: 27.4 % — ABNORMAL LOW (ref 35.0–45.0)
Hemoglobin: 8 g/dL — ABNORMAL LOW (ref 11.7–15.5)
MCH: 18.3 pg — ABNORMAL LOW (ref 27.0–33.0)
MCHC: 29.2 g/dL — ABNORMAL LOW (ref 32.0–36.0)
MCV: 62.8 fL — ABNORMAL LOW (ref 80.0–100.0)
Platelets: 313 10*3/uL (ref 140–400)
RBC: 4.36 10*6/uL (ref 3.80–5.10)
RDW: 18.5 % — ABNORMAL HIGH (ref 11.0–15.0)
WBC: 7.4 10*3/uL (ref 3.8–10.8)

## 2023-07-13 LAB — BASIC METABOLIC PANEL
BUN/Creatinine Ratio: 11 (calc) (ref 6–22)
BUN: 12 mg/dL (ref 7–25)
CO2: 28 mmol/L (ref 20–32)
Calcium: 9.1 mg/dL (ref 8.6–10.4)
Chloride: 102 mmol/L (ref 98–110)
Creat: 1.06 mg/dL — ABNORMAL HIGH (ref 0.50–1.05)
Glucose, Bld: 164 mg/dL — ABNORMAL HIGH (ref 65–99)
Potassium: 3.4 mmol/L — ABNORMAL LOW (ref 3.5–5.3)
Sodium: 141 mmol/L (ref 135–146)

## 2023-07-13 NOTE — Telephone Encounter (Signed)
Received blood counts as below, hemoglobin has dropped.  Called patient to check on her  She notes dark, nearly black stools No pain, but she has been feeling more weak and she is having a harder time keeping her oxygen levels up- she is having to wear her oxygen when she is up and about   I advised patient it sounds as though she may have a GI bleed, would consider having her go to the hospital now for further evaluation and management.  Patient states she has been to this a few times in the past with ulcers, she does not feel she is at the point of needing the emergency room at this time.  I made her an appointment for Monday in my office, but she does agree to seek immediate care if she feels worse over the weekend

## 2023-07-14 NOTE — Progress Notes (Unsigned)
[facility] [address], [address] [place], [place] [address] [phone]  Date:  07/15/2023   Name:  Lynn [name]   DOB:  Dec 17, 1955   MRN:  [id number]  PCP:  Pearline Cables, MD    Chief Complaint: office visit (And labs/Concerns/ questions: none/)   History of Present Illness:  Lynn [name] is a 67 y.o. very pleasant female patient who presents with the following:  Patient seen today with concern of possible GI bleed- history of paroxysmal atrial tach and fibrillation, on diltiazem and Eliquis, interstitial lung disease and COPD on oxygen, diabetes, history of CVA, rheumatoid arthritis, hypertension, thalassemia   Last visit with myself was a virtual visit in September, otherwise I saw her on July 31 She was admitted in March after pulmonology found a hemoglobin of 6.2 on routine labs-blood loss thought due to an upper GI bleed  Her gastroenterologist is Dr. Russella Dar She is currently using pantoprazole 40 mg Pulmonology Dr Craige Cotta  She contacted me last week with concern of a change in stool color  If she has an UGI bleed this time, it will be her 3rd one  We got a hemoglobin which had dropped from 9.2 to 8 g-discussed with patient on the phone, at that time she declined to go to the ER.  We did schedule this visit for today so I can assess for any evidence of further blood loss, she agreed to seek immediate care if worsening over the weekend  She notes a decrease in energy level Her stool looks a bit more normal in color  No vomiting No BRBPR She may get a bit nauseated   She uses 3 liters of oxygen   Patient Active Problem List   Diagnosis Date Noted   ABLA (acute blood loss anemia) 12/15/2022   Acute upper GI bleed 09/04/2022   IDA (iron deficiency anemia) 02/09/2022   Acute on chronic respiratory failure with hypoxia (HCC) 11/14/2021   AKI (acute kidney injury) (HCC)    Syncope 11/13/2021    Acute gastric ulcer without hemorrhage or perforation    Ulcer of esophagus without bleeding    PAF (paroxysmal atrial fibrillation) (HCC); CHA2DS2-VASc score 7 07/05/2021   Anemia    Acute gastric ulcer with hemorrhage    Occult GI bleeding    Melena    Preop respiratory exam    ILD (interstitial lung disease) (HCC)    SOB (shortness of breath) 03/24/2021   FUO (fever of unknown origin) 03/24/2021   Hypokalemia 03/24/2021   Generalized weakness 03/24/2021   Coronary artery spasm (HCC) 08/09/2020   Rheumatoid arthritis (HCC) 11/09/2019   Toe pain, bilateral 05/26/2019   Type 2 diabetes mellitus with hyperlipidemia (HCC) 05/26/2019   Mobility impaired 04/01/2019   Class 3 obesity (HCC) 12/03/2017   DOE (dyspnea on exertion) 12/02/2017   Moderate mitral stenosis by prior echocardiography 08/23/2017   Chronic diastolic CHF (congestive heart failure) (HCC) 08/12/2017   Chronic respiratory failure with hypoxia (HCC) 08/12/2017   OSA on CPAP 08/12/2017   COPD with acute bronchitis (HCC) 08/12/2017   Near syncope 07/03/2017   Anxiety 07/03/2017   Coronary artery disease, non-occlusive 12/08/2016   PAT (paroxysmal atrial tachycardia)    H/O total knee replacement 08/19/2015   Vision, loss, sudden 03/07/2015   S/P TKR (total knee replacement) using cement 02/18/2015   Thalassemia 09/02/2013   Hyperlipidemia associated with type 2 diabetes mellitus (HCC) 08/24/2012  Essential hypertension 07/22/2012    Past Medical History:  Diagnosis Date   Allergy 2018   Anxiety    CHF (congestive heart failure) (HCC)    Clotting disorder (HCC)    blood clot in eye 2016   COPD (chronic obstructive pulmonary disease) (HCC)    Coronary artery disease, non-occlusive    a. 11/2016 NSTEMI/Cath: LM nl, LAD 40p, 50md, D1/2 small, LCX 40ost, OM2/3 nl/small, RCA 35 ost/mid, RPDA/RPL small/nl.   Coronary vasospasm (HCC)    Depression    Diastolic dysfunction    a. 11/2016 Echo: EF 55-60%, gr1 DD, sev  calcified MV annulus, mildly dil LA.   Eye hemorrhage 01/2015   "right; resolved" (07/03/2017)   Gastric ulcer    GERD (gastroesophageal reflux disease)    Headache    "monthly" (07/03/2017)   Heart murmur    History of blood transfusion    "when I had an ectopic pregnancy"   History of stomach ulcers    Hyperlipidemia    Hypertension    Microcytic anemia    Moderate mitral stenosis by prior echocardiogram 03/2021   TTE: Mod MS (mean gradient 9 mmHg). -- TEE Moderate-Severe MS (mean gradient 14 mmHg) with fixed Post MV Leaflet & Severe MAC w/ large circumscribed calcified mass on the Post MV Leaflet. (Mass previously described in 2018) - CMRI identifies calcified mass & not Tumor.; R&LHC - LVEDP & PCWP both 23 mmHg indicating minimal resting gradient   Myocardial infarction (HCC) 2023   OSA on CPAP    setting is unknown   Oxygen deficiency    PAF (paroxysmal atrial fibrillation) (HCC) 03/2021   Event Monitor Aug-Sept 2022: Predominantly sinus rhythm.  1% A. fib burden.  30 brief episodes of PAT.  2 short bursts of NSVT.   PAT (paroxysmal atrial tachycardia)    Event monitor from August 2022 showed 30 brief bursts longest 14 seconds.   Pneumonia    "several times" (07/03/2017)   PVC's (premature ventricular contractions)    Rheumatoid arthritis (HCC)    Sickle cell anemia (HCC)    Sleep apnea    Stroke (HCC)    Syncope 07/03/2017 X 2   called seizures but no medications   Thalassemia    "my cells are sickle cell shaped but I don't have sickle cell anemia" (07/03/2017)   Type II diabetes mellitus (HCC)     Past Surgical History:  Procedure Laterality Date   48-Hour Monitor  03/18/2017   Sinus rhythm with sinus tachycardia (rate 58-134 BPM)multiple PVCs noted with couplets and bigeminy. One triplet. 7 runs of PAT ranging from 100-130 bpm. Longest was 33 beats.   BIOPSY  04/03/2021   Procedure: BIOPSY;  Surgeon: Benancio Deeds, MD;  Location: [facility];  Service:  Gastroenterology;;   BIOPSY  12/17/2022   Procedure: BIOPSY;  Surgeon: Hilarie Fredrickson, MD;  Location: [facility];  Service: Gastroenterology;;   BREAST BIOPSY Left    BRONCHIAL WASHINGS  03/28/2021   Procedure: BRONCHIAL WASHINGS;  Surgeon: Steffanie Dunn, DO;  Location: [facility];  Service: Endoscopy;;   CARDIAC CATHETERIZATION N/A 11/19/2016   Procedure: Left Heart Cath and Coronary Angiography;  Surgeon: Lyn Records, MD;  Location: [facility] INVASIVE CV LAB;  Service: Cardiovascular.    LM nl, LAD 40p, 50md, D1/2 small, LCX 40ost, OM2/3 nl/small, RCA 35 ost/mid, RPDA/RPL small/nl.   CARDIAC MRI  03/30/2021   Normal LVEF ~53%. 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  No Scar or Late Gadolinium Enhancement on LV Myocardium.   CARPAL TUNNEL RELEASE Right 11/01/2014   COLONOSCOPY     DILATION AND CURETTAGE OF UTERUS     ECTOPIC PREGNANCY SURGERY  X 2   ENTEROSCOPY N/A 09/05/2022   Procedure: ENTEROSCOPY;  Surgeon: Meridee Score Netty Starring., MD;  Location: [facility];  Service: Gastroenterology;  Laterality: N/A;   ESOPHAGOGASTRODUODENOSCOPY N/A 12/17/2022   Procedure: ESOPHAGOGASTRODUODENOSCOPY (EGD);  Surgeon: Hilarie Fredrickson, MD;  Location: [facility];  Service: Gastroenterology;  Laterality: N/A;   ESOPHAGOGASTRODUODENOSCOPY (EGD) WITH PROPOFOL N/A 04/03/2021   Procedure: ESOPHAGOGASTRODUODENOSCOPY (EGD) WITH PROPOFOL;  Surgeon: Benancio Deeds, MD;  Location: [facility];  Service: Gastroenterology;  Laterality: N/A;   ESOPHAGOGASTRODUODENOSCOPY (EGD) WITH PROPOFOL N/A 08/15/2021   Procedure: ESOPHAGOGASTRODUODENOSCOPY (EGD) WITH PROPOFOL;  Surgeon: Meryl Dare, MD;  Location: [facility];  Service: Endoscopy;  Laterality: N/A;   HOT HEMOSTASIS N/A 09/05/2022   Procedure: HOT HEMOSTASIS (ARGON PLASMA COAGULATION/BICAP);  Surgeon: Lemar Lofty., MD;  Location: [facility];  Service: Gastroenterology;  Laterality: N/A;   JOINT  REPLACEMENT     KNEE ARTHROSCOPY Left 11/2014   RIGHT/LEFT HEART CATH AND CORONARY ANGIOGRAPHY N/A 08/08/2021   Procedure: RIGHT/LEFT HEART CATH AND CORONARY ANGIOGRAPHY;  Surgeon: Marykay Lex, MD;  Location: [facility] INVASIVE CV LAB;  Service: Cardiovascular;Ost LCx 40%. Ost-Prox RCA 35%. Prox-Mid LAD 25%-<25% D2. Mod-Severely Elevated LVEDP. Mild PA HTN -> PA mean 31 mmHg with a PCWP and LVEDP of 23 mmHg   SUBMUCOSAL TATTOO INJECTION  09/05/2022   Procedure: SUBMUCOSAL TATTOO INJECTION;  Surgeon: Lemar Lofty., MD;  Location: [facility];  Service: Gastroenterology;;   TEE WITHOUT CARDIOVERSION N/A 03/28/2021   Procedure: TRANSESOPHAGEAL ECHOCARDIOGRAM (TEE);  Surgeon: Vesta Mixer, MD;  Location: [facility] ENDOSCOPY;; LVEF 55-60%. Normal LV Fxn & no RWMA. No LAA thrombus. Gr II Ao Plaque. Large circumscribed calcific mass (1.8 x 1.4 cm) anterior to posterior annulus.  Fixed posterior leaflet with Mod-Severe MS (Mean MVG ~14 mmHg, Peak 20 mmhg).  Mild MR   TONSILLECTOMY     TOTAL KNEE ARTHROPLASTY Right 02/18/2015   Procedure: RIGHT TOTAL KNEE ARTHROPLASTY;  Surgeon: Beverely Low, MD;  Location: [facility] OR;  Service: Orthopedics;  Laterality: Right;   TOTAL KNEE ARTHROPLASTY Left 08/19/2015   Procedure: LEFT TOTAL KNEE ARTHROPLASTY;  Surgeon: Beverely Low, MD;  Location: [facility] OR;  Service: Orthopedics;  Laterality: Left;   TRANSTHORACIC ECHOCARDIOGRAM  11/2016; 07/2017:   a) normal LV size, thickness and function. EF 55-60%. GR 1 DD.No RWMA severe mitral annular calcification, but no mitral stenosis. Mild LA dilation;; b) normal LV size and function.  EF 65-75%.  Unable to assess diastolic function.  Aortic sclerosis with no stenosis.  Moderate mitral stenosis? (Gradient 9 mmHg) -?  Mild-mod left atrial enlargement    TRANSTHORACIC ECHOCARDIOGRAM  01/2019   EF 65 to 70%.  Normal function.  Elevated LVEDP-GR 1 DD.  Normal RV size and function.  Large calcific mass on posterior mitral leaflet mild  to moderate mitral stenosis.   TRANSTHORACIC ECHOCARDIOGRAM  03/24/2021   EF 60 to 65%.  LV function with normal wall motion.  Moderate basal septal LVH.  GRII DD & Mild LA dilation.-> elevated LVEDP.  Normal RV function.  Mildly elevated PAP.  Ill-defined density measuring 3.23 x 2.1 cm region of the posterior MV leaflet along with dense MAC.  Moderate MS (mean MVG of 9 mmHg.) Normal AoV & RAP. --> Recommend TEE 2/2 concern for MV SBE.  TRANSTHORACIC ECHOCARDIOGRAM  04/26/2022   EF 60 to 65%.  No RWMA.  Moderate LVH with GR 1 DD.  Normal RV size and function.  Mild LA dilation.  Large calcific mass of posterior MV leaflet.  Mean PG 13 mm 3.  Trivial MR.  Mild to moderate MS.  Severe MAC.  Normal AOV.  Normal RAP.   VAGINAL HYSTERECTOMY     VIDEO BRONCHOSCOPY N/A 03/28/2021   Procedure: VIDEO BRONCHOSCOPY WITHOUT FLUORO;  Surgeon: Steffanie Dunn, DO;  Location: [facility] ENDOSCOPY;  Service: Endoscopy;  Laterality: N/A;    Social History   Tobacco Use   Smoking status: Former    Current packs/day: 0.00    Average packs/day: 0.3 packs/day for 44.0 years (11.0 ttl pk-yrs)    Types: Cigarettes    Start date: 02/17/1971    Quit date: 02/17/2015    Years since quitting: 8.4   Smokeless tobacco: Never  Vaping Use   Vaping status: Never Used  Substance Use Topics   Alcohol use: Not Currently    Comment: 07/03/2017 "might have a few drinks/year"   Drug use: No    Family History  Problem Relation Age of Onset   Cancer Mother    Diabetes Mother    Hypertension Mother    Hyperlipidemia Mother    Cancer Father    Hyperlipidemia Father    Mental illness Sister    Diabetes Sister    Diabetes Maternal Grandmother    Diabetes Maternal Grandfather    Colon cancer Neg Hx    Esophageal cancer Neg Hx    Stomach cancer Neg Hx    Rectal cancer Neg Hx    Tremor Neg Hx    Parkinson's disease Neg Hx     Allergies  Allergen Reactions   Methotrexate Other (See Comments)    Pneumonitis   Penicillins  Hives    Childhood allergy Has patient had a PCN reaction causing immediate rash, facial/tongue/throat swelling, SOB or lightheadedness with hypotension: Yes Has patient had a PCN reaction causing severe rash involving mucus membranes or skin necrosis: No Has patient had a PCN reaction that required hospitalization No Has patient had a PCN reaction occurring within the last 10 years: No If all of the above answers are "NO", then may proceed with Cephalosporin use.   Marcelline Deist [Dapagliflozin] Other (See Comments)    Dizzy and lethargic    Metformin And Related Other (See Comments)    Diarrhea, bleeding    Medication list has been reviewed and updated.  Current Outpatient Medications on File Prior to Visit  Medication Sig Dispense Refill   acetaminophen (TYLENOL) 500 MG tablet Take 500 mg by mouth every 6 (six) hours as needed for fever or moderate pain.     albuterol (PROVENTIL) (2.5 MG/3ML) 0.083% nebulizer solution Take 3 mLs (2.5 mg total) by nebulization every 6 (six) hours as needed for wheezing or shortness of breath. 120 mL 5   albuterol (VENTOLIN HFA) 108 (90 Base) MCG/ACT inhaler TAKE 2 PUFFS BY MOUTH EVERY 6 HOURS AS NEEDED FOR WHEEZE OR SHORTNESS OF BREATH 8.5 each 2   ALPRAZolam (XANAX) 0.25 MG tablet Take 1-2 tablets (0.25-0.5 mg total) by mouth 2 (two) times daily as needed for anxiety. Can also take a dose as needed for insomnia 30 tablet 1   apixaban (ELIQUIS) 5 MG TABS tablet Take 1 tablet (5 mg total) by mouth 2 (two) times daily. Resume taking on 12/19/22 180 tablet 3   atorvastatin (LIPITOR) 80 MG tablet  Take 1 tablet (80 mg total) by mouth every evening. 90 tablet 3   cetirizine (ZYRTEC) 10 MG tablet Take 10 mg by mouth daily as needed for allergies.     Continuous Blood Gluc Transmit (DEXCOM G6 TRANSMITTER) MISC 1 Device by Does not apply route as directed. 1 each 3   Continuous Glucose Sensor (DEXCOM G7 SENSOR) MISC 1 Device by Does not apply route as directed. 9 each 3    diltiazem (CARDIZEM CD) 360 MG 24 hr capsule Take 1 capsule (360 mg total) by mouth daily. 90 capsule 1   folic acid (FOLVITE) 1 MG tablet Take 2 tablets (2 mg total) by mouth daily. (Patient taking differently: Take 1 mg by mouth in the morning and at bedtime.) 180 tablet 3   furosemide (LASIX) 20 MG tablet DECREASE LASIX ( FUROSEMIDE) TO 20 MG ( 1/2/TABLET) , WITH 20 MG YOU MAY TAKE A AN ADDITIONAL 20 MG IF NEED FOR SWELLING OR WEIGHT GAIN MORE THAN 3 LBS OVERNIGHT OR 5 LBS IN ONE WEEK -- IF YOU HAVE CONTINUE TO TAKE AN ADDITIONAL 20 MG , GO BACK TO 40 MG TAKE ADDITIONAL DOSE AS NEEDED FOR EGT GAIN> 3 LB IN 1 DAY 90 tablet 1   glucose blood (CONTOUR NEXT TEST) test strip 3x daily 300 each 11   hydroxychloroquine (PLAQUENIL) 200 MG tablet Take 200 mg by mouth 2 (two) times daily.     Insulin Disposable Pump (OMNIPOD 5 G7 INTRO, GEN 5,) KIT 1 Device by Does not apply route every other day. 1 kit 0   Insulin Disposable Pump (OMNIPOD 5 G7 PODS, GEN 5,) MISC 1 Device by Does not apply route every other day. 45 each 3   insulin lispro (HUMALOG) 100 UNIT/ML injection Max Daily 100 units per pump 90 mL 3   Insulin Pen Needle (B-D UF III MINI PEN NEEDLES) 31G X 5 MM MISC USE DAILY WITH VICTOZA AND LANTUS. (Patient taking differently: Use for ozempic) 400 each 1   Insulin Pen Needle 31G X 5 MM MISC 1 Device by Does not apply route 3 (three) times daily. 150 each 11   isosorbide mononitrate (IMDUR) 30 MG 24 hr tablet Take 1 tablet (30 mg total) by mouth daily. 90 tablet 3   ketoconazole (NIZORAL) 2 % cream Apply 1 Application topically daily. 60 g 2   Lancets 30G MISC 1 Device by Does not apply route 3 (three) times daily. 300 each 9   leflunomide (ARAVA) 20 MG tablet Take 20 mg by mouth daily.     losartan (COZAAR) 100 MG tablet Take 0.5 tablets (50 mg total) by mouth daily. 90 tablet 3   Melatonin 5 MG CHEW Chew 5 mg by mouth at bedtime.     Multiple Vitamin (MULTIVITAMIN WITH MINERALS) TABS tablet Take 1  tablet by mouth daily.     oxymetazoline (AFRIN NASAL SPRAY) 0.05 % nasal spray Place 2 sprays into both nostrils 2 (two) times daily as needed for congestion. Do not use for longer than 2 days 30 mL 0   Oxymetazoline HCl (VICKS SINEX NA) Place 1 spray into the nose daily as needed (congestion).     pantoprazole (PROTONIX) 40 MG tablet Take 1 tablet (40 mg total) by mouth daily. 30 tablet 11   SAFETY-LOK TB SYRINGE 27GX.5" 27G X 1/2" 1 ML MISC      sodium chloride (OCEAN) 0.65 % SOLN nasal spray Place 1 spray into both nostrils as needed. 30 mL 5   tirzepatide (  MOUNJARO) 10 MG/0.5ML Pen Inject 10 mg into the skin once a week. 6 mL 3   traZODone (DESYREL) 50 MG tablet Take 0.5-1 tablets (25-50 mg total) by mouth at bedtime as needed. for sleep 90 tablet 3   No current facility-administered medications on file prior to visit.    Review of Systems:  As per HPI- otherwise negative.   Physical Examination: Vitals:   07/15/23 1447  BP: 122/80  Pulse: 81  Resp: 18  Temp: 98.1 F (36.7 C)  SpO2: 93%   Vitals:   07/15/23 1447  Weight: 237 lb 3.2 oz (107.6 kg)  Height: 5\' 3"  (1.6 m)   Body mass index is 42.02 kg/m. Ideal Body Weight: Weight in (lb) to have BMI = 25: 140.8  GEN: no acute distress.  Seated in power chair, using oxygen via [place].  Looks well but pale  HEENT: Atraumatic, Normocephalic.  Ears and Nose: No external deformity. CV: RRR, No M/G/R. No JVD. No thrill. No extra heart sounds. PULM: CTA B, no wheezes, crackles, rhonchi. No retractions. No resp. distress. No accessory muscle use. ABD: S, NT, ND EXTR: No c/c/e PSYCH: Normally interactive. Conversant.   Hemoccult- very little stool in vault but test does appear +  Results for orders placed or performed in visit on 07/15/23  POCT hemoglobin  Result Value Ref Range   Hemoglobin 7.3 (A) 11 - 14.6 g/dL  POCT Occult Blood Stool  Result Value Ref Range   Fecal Occult Blood, POC Positive (A) Negative   Card #1 Date  07/15/23    Card #2 Fecal Occult Blod, POC     Card #2 Date     Card #3 Fecal Occult Blood, POC     Card #3 Date       Assessment and Plan: Upper GI bleed - Plan: POCT hemoglobin, POCT Occult Blood Stool, CANCELED: CBC, CANCELED: Fecal occult blood, imunochemical, CANCELED: Fecal occult blood, imunochemical Pt seen today with recurrent GI bleed Her hg has dropped again from 8 to 7.3- advised she needs to go to the ER for likely admission and she states understanding, her husband will drive her to [facility] and gave brief report to triage Her last dose of eliquis was this am   Signed Abbe Amsterdam, MD

## 2023-07-15 ENCOUNTER — Other Ambulatory Visit: Payer: Self-pay

## 2023-07-15 ENCOUNTER — Inpatient Hospital Stay (HOSPITAL_COMMUNITY)
Admission: EM | Admit: 2023-07-15 | Discharge: 2023-07-24 | DRG: 377 | Disposition: A | Discharge status: Home / Self Care | Payer: Medicare Other | Attending: Family Medicine | Admitting: Family Medicine

## 2023-07-15 ENCOUNTER — Encounter (HOSPITAL_COMMUNITY): Payer: Self-pay

## 2023-07-15 ENCOUNTER — Ambulatory Visit (INDEPENDENT_AMBULATORY_CARE_PROVIDER_SITE_OTHER): Payer: Medicare Other | Admitting: Family Medicine

## 2023-07-15 VITALS — BP 122/80 | HR 81 | Temp 98.1°F | Resp 18 | Ht 63.0 in | Wt 237.2 lb

## 2023-07-15 DIAGNOSIS — Z888 Allergy status to other drugs, medicaments and biological substances status: Secondary | ICD-10-CM

## 2023-07-15 DIAGNOSIS — K922 Gastrointestinal hemorrhage, unspecified: Secondary | ICD-10-CM | POA: Insufficient documentation

## 2023-07-15 DIAGNOSIS — M069 Rheumatoid arthritis, unspecified: Secondary | ICD-10-CM | POA: Diagnosis not present

## 2023-07-15 DIAGNOSIS — F419 Anxiety disorder, unspecified: Secondary | ICD-10-CM | POA: Diagnosis present

## 2023-07-15 DIAGNOSIS — K2210 Ulcer of esophagus without bleeding: Secondary | ICD-10-CM | POA: Diagnosis not present

## 2023-07-15 DIAGNOSIS — R040 Epistaxis: Secondary | ICD-10-CM | POA: Diagnosis present

## 2023-07-15 DIAGNOSIS — K5521 Angiodysplasia of colon with hemorrhage: Secondary | ICD-10-CM

## 2023-07-15 DIAGNOSIS — K254 Chronic or unspecified gastric ulcer with hemorrhage: Secondary | ICD-10-CM | POA: Diagnosis present

## 2023-07-15 DIAGNOSIS — J849 Interstitial pulmonary disease, unspecified: Secondary | ICD-10-CM | POA: Diagnosis present

## 2023-07-15 DIAGNOSIS — Z83438 Family history of other disorder of lipoprotein metabolism and other lipidemia: Secondary | ICD-10-CM

## 2023-07-15 DIAGNOSIS — I10 Essential (primary) hypertension: Secondary | ICD-10-CM | POA: Diagnosis not present

## 2023-07-15 DIAGNOSIS — F32A Depression, unspecified: Secondary | ICD-10-CM | POA: Diagnosis present

## 2023-07-15 DIAGNOSIS — Z9981 Dependence on supplemental oxygen: Secondary | ICD-10-CM

## 2023-07-15 DIAGNOSIS — E785 Hyperlipidemia, unspecified: Secondary | ICD-10-CM | POA: Diagnosis present

## 2023-07-15 DIAGNOSIS — D72829 Elevated white blood cell count, unspecified: Secondary | ICD-10-CM | POA: Diagnosis not present

## 2023-07-15 DIAGNOSIS — Z1152 Encounter for screening for COVID-19: Secondary | ICD-10-CM | POA: Diagnosis not present

## 2023-07-15 DIAGNOSIS — J9611 Chronic respiratory failure with hypoxia: Secondary | ICD-10-CM | POA: Diagnosis present

## 2023-07-15 DIAGNOSIS — Z6841 Body Mass Index (BMI) 40.0 and over, adult: Secondary | ICD-10-CM

## 2023-07-15 DIAGNOSIS — Z833 Family history of diabetes mellitus: Secondary | ICD-10-CM

## 2023-07-15 DIAGNOSIS — K31811 Angiodysplasia of stomach and duodenum with bleeding: Secondary | ICD-10-CM | POA: Diagnosis not present

## 2023-07-15 DIAGNOSIS — Z23 Encounter for immunization: Secondary | ICD-10-CM

## 2023-07-15 DIAGNOSIS — T45515A Adverse effect of anticoagulants, initial encounter: Secondary | ICD-10-CM | POA: Diagnosis present

## 2023-07-15 DIAGNOSIS — D649 Anemia, unspecified: Principal | ICD-10-CM

## 2023-07-15 DIAGNOSIS — J449 Chronic obstructive pulmonary disease, unspecified: Secondary | ICD-10-CM | POA: Diagnosis present

## 2023-07-15 DIAGNOSIS — R109 Unspecified abdominal pain: Secondary | ICD-10-CM | POA: Diagnosis not present

## 2023-07-15 DIAGNOSIS — D6832 Hemorrhagic disorder due to extrinsic circulating anticoagulants: Secondary | ICD-10-CM | POA: Diagnosis present

## 2023-07-15 DIAGNOSIS — G4733 Obstructive sleep apnea (adult) (pediatric): Secondary | ICD-10-CM | POA: Diagnosis present

## 2023-07-15 DIAGNOSIS — D569 Thalassemia, unspecified: Secondary | ICD-10-CM | POA: Diagnosis present

## 2023-07-15 DIAGNOSIS — Z8673 Personal history of transient ischemic attack (TIA), and cerebral infarction without residual deficits: Secondary | ICD-10-CM

## 2023-07-15 DIAGNOSIS — I480 Paroxysmal atrial fibrillation: Secondary | ICD-10-CM | POA: Diagnosis present

## 2023-07-15 DIAGNOSIS — I2510 Atherosclerotic heart disease of native coronary artery without angina pectoris: Secondary | ICD-10-CM | POA: Diagnosis present

## 2023-07-15 DIAGNOSIS — Z90710 Acquired absence of both cervix and uterus: Secondary | ICD-10-CM

## 2023-07-15 DIAGNOSIS — I252 Old myocardial infarction: Secondary | ICD-10-CM

## 2023-07-15 DIAGNOSIS — I5033 Acute on chronic diastolic (congestive) heart failure: Secondary | ICD-10-CM | POA: Diagnosis not present

## 2023-07-15 DIAGNOSIS — I050 Rheumatic mitral stenosis: Secondary | ICD-10-CM | POA: Diagnosis not present

## 2023-07-15 DIAGNOSIS — I509 Heart failure, unspecified: Secondary | ICD-10-CM | POA: Diagnosis not present

## 2023-07-15 DIAGNOSIS — R195 Other fecal abnormalities: Secondary | ICD-10-CM | POA: Diagnosis present

## 2023-07-15 DIAGNOSIS — E119 Type 2 diabetes mellitus without complications: Secondary | ICD-10-CM

## 2023-07-15 DIAGNOSIS — D62 Acute posthemorrhagic anemia: Secondary | ICD-10-CM | POA: Diagnosis present

## 2023-07-15 DIAGNOSIS — I110 Hypertensive heart disease with heart failure: Secondary | ICD-10-CM | POA: Diagnosis present

## 2023-07-15 DIAGNOSIS — D509 Iron deficiency anemia, unspecified: Secondary | ICD-10-CM | POA: Diagnosis not present

## 2023-07-15 DIAGNOSIS — D571 Sickle-cell disease without crisis: Secondary | ICD-10-CM | POA: Diagnosis present

## 2023-07-15 DIAGNOSIS — Z794 Long term (current) use of insulin: Secondary | ICD-10-CM

## 2023-07-15 DIAGNOSIS — Z8249 Family history of ischemic heart disease and other diseases of the circulatory system: Secondary | ICD-10-CM

## 2023-07-15 DIAGNOSIS — Z79899 Other long term (current) drug therapy: Secondary | ICD-10-CM

## 2023-07-15 DIAGNOSIS — Z7985 Long-term (current) use of injectable non-insulin antidiabetic drugs: Secondary | ICD-10-CM

## 2023-07-15 DIAGNOSIS — E876 Hypokalemia: Secondary | ICD-10-CM | POA: Diagnosis present

## 2023-07-15 DIAGNOSIS — Z87891 Personal history of nicotine dependence: Secondary | ICD-10-CM

## 2023-07-15 DIAGNOSIS — L80 Vitiligo: Secondary | ICD-10-CM | POA: Diagnosis present

## 2023-07-15 DIAGNOSIS — K921 Melena: Secondary | ICD-10-CM | POA: Diagnosis not present

## 2023-07-15 DIAGNOSIS — Z880 Allergy status to penicillin: Secondary | ICD-10-CM

## 2023-07-15 DIAGNOSIS — E66813 Obesity, class 3: Secondary | ICD-10-CM | POA: Diagnosis present

## 2023-07-15 DIAGNOSIS — Z8711 Personal history of peptic ulcer disease: Secondary | ICD-10-CM | POA: Diagnosis not present

## 2023-07-15 DIAGNOSIS — Z7901 Long term (current) use of anticoagulants: Secondary | ICD-10-CM

## 2023-07-15 DIAGNOSIS — J811 Chronic pulmonary edema: Secondary | ICD-10-CM | POA: Diagnosis not present

## 2023-07-15 DIAGNOSIS — I5022 Chronic systolic (congestive) heart failure: Secondary | ICD-10-CM | POA: Diagnosis not present

## 2023-07-15 DIAGNOSIS — Z818 Family history of other mental and behavioral disorders: Secondary | ICD-10-CM

## 2023-07-15 DIAGNOSIS — K5520 Angiodysplasia of colon without hemorrhage: Secondary | ICD-10-CM | POA: Diagnosis not present

## 2023-07-15 DIAGNOSIS — K219 Gastro-esophageal reflux disease without esophagitis: Secondary | ICD-10-CM | POA: Diagnosis present

## 2023-07-15 DIAGNOSIS — K31819 Angiodysplasia of stomach and duodenum without bleeding: Secondary | ICD-10-CM | POA: Diagnosis present

## 2023-07-15 DIAGNOSIS — R0989 Other specified symptoms and signs involving the circulatory and respiratory systems: Secondary | ICD-10-CM | POA: Diagnosis not present

## 2023-07-15 DIAGNOSIS — Z96653 Presence of artificial knee joint, bilateral: Secondary | ICD-10-CM | POA: Diagnosis present

## 2023-07-15 DIAGNOSIS — R918 Other nonspecific abnormal finding of lung field: Secondary | ICD-10-CM | POA: Diagnosis not present

## 2023-07-15 LAB — CBC WITH DIFFERENTIAL/PLATELET
Abs Immature Granulocytes: 0.03 10*3/uL (ref 0.00–0.07)
Basophils Absolute: 0.1 10*3/uL (ref 0.0–0.1)
Basophils Relative: 1 %
Eosinophils Absolute: 0.2 10*3/uL (ref 0.0–0.5)
Eosinophils Relative: 2 %
HCT: 24 % — ABNORMAL LOW (ref 36.0–46.0)
Hemoglobin: 7.1 g/dL — ABNORMAL LOW (ref 12.0–15.0)
Immature Granulocytes: 0 %
Lymphocytes Relative: 15 %
Lymphs Abs: 1.3 10*3/uL (ref 0.7–4.0)
MCH: 18.7 pg — ABNORMAL LOW (ref 26.0–34.0)
MCHC: 29.6 g/dL — ABNORMAL LOW (ref 30.0–36.0)
MCV: 63.3 fL — ABNORMAL LOW (ref 80.0–100.0)
Monocytes Absolute: 1 10*3/uL (ref 0.1–1.0)
Monocytes Relative: 11 %
Neutro Abs: 6.6 10*3/uL (ref 1.7–7.7)
Neutrophils Relative %: 71 %
Platelets: 243 10*3/uL (ref 150–400)
RBC: 3.79 MIL/uL — ABNORMAL LOW (ref 3.87–5.11)
RDW: 18.8 % — ABNORMAL HIGH (ref 11.5–15.5)
WBC: 9.2 10*3/uL (ref 4.0–10.5)
nRBC: 0 % (ref 0.0–0.2)

## 2023-07-15 LAB — HEMOCCULT GUIAC POC 1CARD (OFFICE): Fecal Occult Blood, POC: POSITIVE — AB

## 2023-07-15 LAB — COMPREHENSIVE METABOLIC PANEL
ALT: 19 U/L (ref 0–44)
AST: 29 U/L (ref 15–41)
Albumin: 3.7 g/dL (ref 3.5–5.0)
Alkaline Phosphatase: 66 U/L (ref 38–126)
Anion gap: 11 (ref 5–15)
BUN: 13 mg/dL (ref 8–23)
CO2: 29 mmol/L (ref 22–32)
Calcium: 8.9 mg/dL (ref 8.9–10.3)
Chloride: 100 mmol/L (ref 98–111)
Creatinine, Ser: 1.11 mg/dL — ABNORMAL HIGH (ref 0.44–1.00)
GFR, Estimated: 54 mL/min — ABNORMAL LOW (ref >60)
Glucose, Bld: 113 mg/dL — ABNORMAL HIGH (ref 70–99)
Potassium: 3.5 mmol/L (ref 3.5–5.1)
Sodium: 140 mmol/L (ref 135–145)
Total Bilirubin: 0.8 mg/dL (ref 0.3–1.2)
Total Protein: 7.3 g/dL (ref 6.5–8.1)

## 2023-07-15 LAB — POCT HEMOGLOBIN: Hemoglobin: 7.3 g/dL — AB (ref 11–14.6)

## 2023-07-15 LAB — GLUCOSE, CAPILLARY: Glucose-Capillary: 148 mg/dL — ABNORMAL HIGH (ref 70–99)

## 2023-07-15 LAB — PREPARE RBC (CROSSMATCH)

## 2023-07-15 MED ORDER — MELATONIN 5 MG PO CHEW: 10.0000 mg | CHEWABLE_TABLET | Freq: Every day | ORAL | Status: DC

## 2023-07-15 MED ORDER — HYDROXYCHLOROQUINE SULFATE 200 MG PO TABS
200.0000 mg | ORAL_TABLET | Freq: Two times a day (BID) | ORAL | Status: DC
  Administered 2023-07-16 – 2023-07-24 (×18): 200 mg via ORAL
  Filled 2023-07-15 (×19): qty 1

## 2023-07-15 MED ORDER — ISOSORBIDE MONONITRATE ER 30 MG PO TB24
30.0000 mg | ORAL_TABLET | Freq: Every day | ORAL | Status: DC
  Administered 2023-07-16 – 2023-07-24 (×9): 30 mg via ORAL
  Filled 2023-07-15 (×9): qty 1

## 2023-07-15 MED ORDER — LEFLUNOMIDE 10 MG PO TABS
20.0000 mg | ORAL_TABLET | Freq: Every day | ORAL | Status: DC
  Administered 2023-07-16 – 2023-07-24 (×9): 20 mg via ORAL
  Filled 2023-07-15 (×9): qty 2

## 2023-07-15 MED ORDER — LORATADINE 10 MG PO TABS: 10.0000 mg | ORAL_TABLET | Freq: Every day | ORAL | Status: DC | PRN

## 2023-07-15 MED ORDER — ONDANSETRON HCL 4 MG/2ML IJ SOLN
4.0000 mg | Freq: Four times a day (QID) | INTRAMUSCULAR | Status: DC | PRN
  Administered 2023-07-19: 4 mg via INTRAVENOUS
  Filled 2023-07-15: qty 2

## 2023-07-15 MED ORDER — ALBUTEROL SULFATE (2.5 MG/3ML) 0.083% IN NEBU
2.5000 mg | INHALATION_SOLUTION | Freq: Every day | RESPIRATORY_TRACT | Status: DC
  Administered 2023-07-16: 2.5 mg via RESPIRATORY_TRACT
  Filled 2023-07-15: qty 3

## 2023-07-15 MED ORDER — SALINE SPRAY 0.65 % NA SOLN: 1.0000 | NASAL | Status: DC | PRN

## 2023-07-15 MED ORDER — ACETAMINOPHEN 325 MG PO TABS
650.0000 mg | ORAL_TABLET | Freq: Four times a day (QID) | ORAL | Status: DC | PRN
  Administered 2023-07-16 – 2023-07-21 (×6): 650 mg via ORAL
  Filled 2023-07-15 (×6): qty 2

## 2023-07-15 MED ORDER — TRAZODONE HCL 50 MG PO TABS
25.0000 mg | ORAL_TABLET | Freq: Every evening | ORAL | Status: DC | PRN
  Filled 2023-07-15: qty 1

## 2023-07-15 MED ORDER — FOLIC ACID 1 MG PO TABS
1.0000 mg | ORAL_TABLET | Freq: Every day | ORAL | Status: DC
  Administered 2023-07-16 – 2023-07-24 (×9): 1 mg via ORAL
  Filled 2023-07-15 (×9): qty 1

## 2023-07-15 MED ORDER — ONDANSETRON HCL 4 MG PO TABS: 4.0000 mg | ORAL_TABLET | Freq: Four times a day (QID) | ORAL | Status: DC | PRN

## 2023-07-15 MED ORDER — MELATONIN 5 MG PO TABS
10.0000 mg | ORAL_TABLET | Freq: Every day | ORAL | Status: DC
  Administered 2023-07-15 – 2023-07-23 (×9): 10 mg via ORAL
  Filled 2023-07-15 (×9): qty 2

## 2023-07-15 MED ORDER — ACETAMINOPHEN 650 MG RE SUPP: 650.0000 mg | Freq: Four times a day (QID) | RECTAL | Status: DC | PRN

## 2023-07-15 MED ORDER — DILTIAZEM HCL ER COATED BEADS 240 MG PO CP24
360.0000 mg | ORAL_CAPSULE | Freq: Every day | ORAL | Status: DC
  Administered 2023-07-16 – 2023-07-24 (×9): 360 mg via ORAL
  Filled 2023-07-15 (×9): qty 1

## 2023-07-15 MED ORDER — ATORVASTATIN CALCIUM 40 MG PO TABS
80.0000 mg | ORAL_TABLET | Freq: Every day | ORAL | Status: DC
  Administered 2023-07-16 – 2023-07-24 (×9): 80 mg via ORAL
  Filled 2023-07-15 (×9): qty 2

## 2023-07-15 MED ORDER — POTASSIUM CHLORIDE IN NACL 20-0.9 MEQ/L-% IV SOLN
INTRAVENOUS | Status: DC
  Filled 2023-07-15 (×7): qty 1000

## 2023-07-15 MED ORDER — ALBUTEROL SULFATE (2.5 MG/3ML) 0.083% IN NEBU: 2.5000 mg | INHALATION_SOLUTION | Freq: Three times a day (TID) | RESPIRATORY_TRACT | Status: DC | PRN

## 2023-07-15 MED ORDER — LOSARTAN POTASSIUM 50 MG PO TABS
50.0000 mg | ORAL_TABLET | Freq: Every day | ORAL | Status: DC
  Administered 2023-07-16 – 2023-07-24 (×9): 50 mg via ORAL
  Filled 2023-07-15 (×9): qty 1

## 2023-07-15 MED ORDER — PANTOPRAZOLE SODIUM 40 MG IV SOLR
40.0000 mg | Freq: Two times a day (BID) | INTRAVENOUS | Status: DC
  Administered 2023-07-15 – 2023-07-24 (×18): 40 mg via INTRAVENOUS
  Filled 2023-07-15 (×17): qty 10

## 2023-07-15 MED ORDER — ALPRAZOLAM 0.25 MG PO TABS: 0.2500 mg | ORAL_TABLET | Freq: Two times a day (BID) | ORAL | Status: DC | PRN

## 2023-07-15 MED ORDER — INSULIN ASPART 100 UNIT/ML IJ SOLN
0.0000 [IU] | Freq: Every day | INTRAMUSCULAR | Status: DC
  Administered 2023-07-20: 1 [IU] via SUBCUTANEOUS
  Administered 2023-07-23: 2 [IU] via SUBCUTANEOUS

## 2023-07-15 MED ORDER — SODIUM CHLORIDE 0.9% IV SOLUTION: Freq: Once | INTRAVENOUS | Status: AC

## 2023-07-15 MED ORDER — INSULIN ASPART 100 UNIT/ML IJ SOLN
0.0000 [IU] | Freq: Three times a day (TID) | INTRAMUSCULAR | Status: DC
  Administered 2023-07-16 (×2): 2 [IU] via SUBCUTANEOUS
  Administered 2023-07-16: 1 [IU] via SUBCUTANEOUS
  Administered 2023-07-17: 2 [IU] via SUBCUTANEOUS
  Administered 2023-07-17 – 2023-07-19 (×6): 1 [IU] via SUBCUTANEOUS
  Administered 2023-07-19 – 2023-07-21 (×5): 2 [IU] via SUBCUTANEOUS
  Administered 2023-07-21: 1 [IU] via SUBCUTANEOUS
  Administered 2023-07-22: 2 [IU] via SUBCUTANEOUS
  Administered 2023-07-22 – 2023-07-23 (×3): 1 [IU] via SUBCUTANEOUS
  Administered 2023-07-23: 3 [IU] via SUBCUTANEOUS
  Administered 2023-07-24: 1 [IU] via SUBCUTANEOUS

## 2023-07-15 MED ORDER — PANTOPRAZOLE SODIUM 40 MG IV SOLR
40.0000 mg | Freq: Once | INTRAVENOUS | Status: DC
  Filled 2023-07-15: qty 10

## 2023-07-15 NOTE — Progress Notes (Signed)
Patient arrived to room, placed on telemetry and vitals and weight obtained

## 2023-07-15 NOTE — Assessment & Plan Note (Signed)
-   We will can change her Xanax.

## 2023-07-15 NOTE — Assessment & Plan Note (Signed)
-   This is clearly secondary to #1. - The patient was typed and crossmatch and will be transfused 1 unit of packed red blood cells. - Will follow posttransfusion and serial H&H.

## 2023-07-15 NOTE — Assessment & Plan Note (Signed)
-   We will can do her antihypertensive therapy.

## 2023-07-15 NOTE — Assessment & Plan Note (Deleted)
-   This is highly suspicious for upper GI etiology especially given history of gastric ulcer and current melena. - The patient will be admitted to a medically monitored bed. - Will follow serial hemoglobins and hematocrits. - We will continue IV Protonix. - We will hold off Eliquis. - GI consult will be obtained in AM. - I notified Quentin Mulling, PA-C and [facility] team.

## 2023-07-15 NOTE — ED Provider Notes (Signed)
[place] EMERGENCY DEPARTMENT AT [facility] Provider Note   CSN: [id number] Arrival date & time: 07/15/23  1708     History  Chief Complaint  Patient presents with   Abnormal Lab    Lynn [name] is a 67 y.o. female.  67 year old female with known AVMs/gastric ulcers who with atrial fibrillation on Eliquis presenting to the emergency department today with concern for hemoglobin.  The patient states that she has been noticing dark stools now for the past week.  She also reports feeling some shortness of breath and some dyspnea on exertion.  She followed up with her doctor last week and had a hemoglobin checked and it was low at 7.  She was called today and was still having symptoms so her doctor sent her to the emergency department for further evaluation.  The patient last had an endoscopy back in March and has been on a PPI since then.  She is currently back on her Eliquis.  She denies seeing any frank blood in her stool.   Abnormal Lab      Home Medications Prior to Admission medications   Medication Sig Start Date End Date Taking? Authorizing Provider  acetaminophen (TYLENOL) 500 MG tablet Take 1,000 mg by mouth every 6 (six) hours as needed (for pain).   Yes [provider]  albuterol (PROVENTIL) (2.5 MG/3ML) 0.083% nebulizer solution Take 3 mLs (2.5 mg total) by nebulization every 6 (six) hours as needed for wheezing or shortness of breath. Patient taking differently: Take 2.5 mg by nebulization See admin instructions. Nebulize 2.5 mg and inhale into the lungs at bedtime- may use an additional 2.5 mg up to three times a day as needed for wheezing or shortness of breath 12/20/22  Yes Cobb, Ruby Cola, NP  albuterol (VENTOLIN HFA) 108 (90 Base) MCG/ACT inhaler TAKE 2 PUFFS BY MOUTH EVERY 6 HOURS AS NEEDED FOR WHEEZE OR SHORTNESS OF BREATH Patient taking differently: Inhale 2 puffs into the lungs every 6 (six) hours as needed for wheezing or shortness  of breath. 05/31/23  Yes Kalman Shan, MD  ALPRAZolam Prudy Feeler) 0.25 MG tablet Take 1-2 tablets (0.25-0.5 mg total) by mouth 2 (two) times daily as needed for anxiety. Can also take a dose as needed for insomnia Patient taking differently: Take 0.25-0.5 mg by mouth 2 (two) times daily as needed for anxiety (may also take an additional 0.25-0.5 mg at bedtime as needed for insomnia). 06/24/23  Yes Copland, Gwenlyn Found, MD  apixaban (ELIQUIS) 5 MG TABS tablet Take 1 tablet (5 mg total) by mouth 2 (two) times daily. Resume taking on 12/19/22 12/18/22  Yes Briant Cedar, MD  atorvastatin (LIPITOR) 80 MG tablet Take 1 tablet (80 mg total) by mouth every evening. Patient taking differently: Take 80 mg by mouth daily. 09/19/22  Yes Marykay Lex, MD  cetirizine (ZYRTEC) 10 MG tablet Take 10 mg by mouth daily as needed for allergies.   Yes [provider]  Continuous Glucose Sensor (DEXCOM G7 SENSOR) MISC 1 Device by Does not apply route as directed. 04/22/23  Yes [name], Konrad Dolores, MD  diltiazem (CARDIZEM CD) 360 MG 24 hr capsule Take 1 capsule (360 mg total) by mouth daily. 01/24/23  Yes Marykay Lex, MD  folic acid (FOLVITE) 1 MG tablet Take 2 tablets (2 mg total) by mouth daily. Patient taking differently: Take 1 mg by mouth daily. 01/21/20  Yes Copland, Gwenlyn Found, MD  furosemide (LASIX) 20 MG tablet DECREASE LASIX (  FUROSEMIDE) TO 20 MG ( 1/2/TABLET) , WITH 20 MG YOU MAY TAKE A AN ADDITIONAL 20 MG IF NEED FOR SWELLING OR WEIGHT GAIN MORE THAN 3 LBS OVERNIGHT OR 5 LBS IN ONE WEEK -- IF YOU HAVE CONTINUE TO TAKE AN ADDITIONAL 20 MG , GO BACK TO 40 MG TAKE ADDITIONAL DOSE AS NEEDED FOR EGT GAIN> 3 LB IN 1 DAY Patient taking differently: Take 20 mg by mouth See admin instructions. Take 20 mg by mouth in the morning and an additional 20 mg for an overnight weight gain of 3 pounds or 5 pounds in a week 03/20/23  Yes Marykay Lex, MD  hydroxychloroquine (PLAQUENIL) 200 MG tablet Take 200 mg  by mouth 2 (two) times daily. 05/30/22  Yes [provider]  Insulin Disposable Pump (OMNIPOD 5 G6 PODS, GEN 5,) MISC Inject 1 Device into the skin every 3 (three) days.   Yes [provider]  insulin lispro (HUMALOG) 100 UNIT/ML injection Max Daily 100 units per pump Patient taking differently: 100 Units See admin instructions. Max Daily 100 units per pump- every three days 05/13/23  Yes [name], Konrad Dolores, MD  isosorbide mononitrate (IMDUR) 30 MG 24 hr tablet Take 1 tablet (30 mg total) by mouth daily. 09/19/22  Yes Marykay Lex, MD  leflunomide (ARAVA) 20 MG tablet Take 20 mg by mouth daily. 08/16/21  Yes [provider]  losartan (COZAAR) 100 MG tablet Take 0.5 tablets (50 mg total) by mouth daily. 09/19/22  Yes Marykay Lex, MD  Melatonin 5 MG CHEW Chew 10 mg by mouth at bedtime.   Yes [provider]  Multiple Vitamin (MULTIVITAMIN WITH MINERALS) TABS tablet Take 1 tablet by mouth daily with breakfast.   Yes [provider]  Oxymetazoline HCl (VICKS SINEX NA) Place 1 spray into both nostrils every 12 (twelve) hours as needed (for congestion- MAX OF THREE CONSECUTIVE DAYS).   Yes [provider]  pantoprazole (PROTONIX) 40 MG tablet Take 1 tablet (40 mg total) by mouth daily. 12/31/22 12/31/23 Yes Meryl Dare, MD  sodium chloride (OCEAN) 0.65 % SOLN nasal spray Place 1 spray into both nostrils as needed. Patient taking differently: Place 1 spray into both nostrils as needed for congestion. 07/11/23  Yes Ashok Croon, MD  tirzepatide [facility]) 10 MG/0.5ML Pen Inject 10 mg into the skin once a week. Patient taking differently: Inject 10 mg into the skin every Sunday. 04/22/23  Yes [name], Konrad Dolores, MD  traZODone (DESYREL) 50 MG tablet Take 0.5-1 tablets (25-50 mg total) by mouth at bedtime as needed. for sleep Patient taking differently: Take 50 mg by mouth at bedtime. 05/15/23  Yes Copland, Gwenlyn Found, MD  Continuous  Blood Gluc Transmit (DEXCOM G6 TRANSMITTER) MISC 1 Device by Does not apply route as directed. 10/19/22   [name], Konrad Dolores, MD  glucose blood (CONTOUR NEXT TEST) test strip 3x daily 07/20/19   [name], Konrad Dolores, MD  Insulin Disposable Pump (OMNIPOD 5 G7 INTRO, GEN 5,) KIT 1 Device by Does not apply route every other day. Patient not taking: Reported on 07/15/2023 05/13/23   [name], Konrad Dolores, MD  Insulin Disposable Pump (OMNIPOD 5 G7 PODS, GEN 5,) MISC 1 Device by Does not apply route every other day. Patient not taking: Reported on 07/15/2023 07/01/23   [name], Konrad Dolores, MD  Insulin Pen Needle (B-D UF III MINI PEN NEEDLES) 31G X 5 MM MISC USE DAILY WITH VICTOZA AND LANTUS. Patient taking differently: Use for ozempic 07/20/19  [name], Konrad Dolores, MD  Insulin Pen Needle 31G X 5 MM MISC 1 Device by Does not apply route 3 (three) times daily. 01/21/20   Copland, Gwenlyn Found, MD  ketoconazole (NIZORAL) 2 % cream Apply 1 Application topically daily. Patient not taking: Reported on 07/15/2023 11/12/22   Louann Sjogren, DPM  Lancets 30G MISC 1 Device by Does not apply route 3 (three) times daily. 07/16/19   Copland, Gwenlyn Found, MD  oxymetazoline (AFRIN NASAL SPRAY) 0.05 % nasal spray Place 2 sprays into both nostrils 2 (two) times daily as needed for congestion. Do not use for longer than 2 days Patient not taking: Reported on 07/15/2023 07/11/23   Ashok Croon, MD  SAFETY-LOK TB SYRINGE 27GX.5" 27G X 1/2" 1 ML MISC  08/18/19   [provider]      Allergies    Methotrexate, Penicillins, Farxiga [dapagliflozin], and Metformin and related    Review of Systems   Review of Systems  Gastrointestinal:  Negative for nausea and vomiting.       Melena  All other systems reviewed and are negative.   Physical Exam Updated Vital Signs BP 123/60   Pulse 75   Temp 98.4 F (36.9 C)   Resp 16   Ht 5\' 3"  (1.6 m)   Wt 107 kg   LMP  (LMP Unknown)   SpO2 97%    BMI 41.79 kg/m  Physical Exam Vitals and nursing note reviewed.   Gen: NAD Eyes: PERRL, EOMI HEENT: no oropharyngeal swelling Neck: trachea midline Resp: clear to auscultation bilaterally Card: RRR, no murmurs, rubs, or gallops Abd: nontender, nondistended Extremities: no calf tenderness, no edema Vascular: 2+ radial pulses bilaterally, 2+ DP pulses bilaterally Skin: no rashes Psyc: acting appropriately   ED Results / Procedures / Treatments   Labs (all labs ordered are listed, but only abnormal results are displayed) Labs Reviewed  CBC WITH DIFFERENTIAL/PLATELET - Abnormal; Notable for the following components:      Result Value   RBC 3.79 (*)    Hemoglobin 7.1 (*)    HCT 24.0 (*)    MCV 63.3 (*)    MCH 18.7 (*)    MCHC 29.6 (*)    RDW 18.8 (*)    All other components within normal limits  COMPREHENSIVE METABOLIC PANEL - Abnormal; Notable for the following components:   Glucose, Bld 113 (*)    Creatinine, Ser 1.11 (*)    GFR, Estimated 54 (*)    All other components within normal limits  HEMOGLOBIN AND HEMATOCRIT, BLOOD  HEMOGLOBIN AND HEMATOCRIT, BLOOD  HEMOGLOBIN AND HEMATOCRIT, BLOOD  TYPE AND SCREEN  PREPARE RBC (CROSSMATCH)    EKG None  Radiology No results found.  Procedures Procedures    Medications Ordered in ED Medications  0.9 %  sodium chloride infusion (Manually program via Guardrails IV Fluids) (has no administration in time range)  pantoprazole (PROTONIX) injection 40 mg (has no administration in time range)  leflunomide (ARAVA) tablet 20 mg (has no administration in time range)  hydroxychloroquine (PLAQUENIL) tablet 200 mg (has no administration in time range)  atorvastatin (LIPITOR) tablet 80 mg (has no administration in time range)  diltiazem (CARDIZEM CD) 24 hr capsule 360 mg (has no administration in time range)  isosorbide mononitrate (IMDUR) 24 hr tablet 30 mg (has no administration in time range)  losartan (COZAAR) tablet 50 mg  (has no administration in time range)  ALPRAZolam (XANAX) tablet 0.25-0.5 mg (has no administration in time range)  folic acid (FOLVITE)  tablet 1 mg (has no administration in time range)  albuterol (PROVENTIL) (2.5 MG/3ML) 0.083% nebulizer solution 2.5 mg (has no administration in time range)  loratadine (CLARITIN) tablet 10 mg (has no administration in time range)  sodium chloride (OCEAN) 0.65 % nasal spray 1 spray (has no administration in time range)  acetaminophen (TYLENOL) tablet 650 mg (has no administration in time range)    Or  acetaminophen (TYLENOL) suppository 650 mg (has no administration in time range)  traZODone (DESYREL) tablet 25 mg (has no administration in time range)  ondansetron (ZOFRAN) tablet 4 mg (has no administration in time range)    Or  ondansetron (ZOFRAN) injection 4 mg (has no administration in time range)  0.9 % NaCl with KCl 20 mEq/ L  infusion (has no administration in time range)  albuterol (PROVENTIL) (2.5 MG/3ML) 0.083% nebulizer solution 2.5 mg (has no administration in time range)  melatonin tablet 10 mg (has no administration in time range)  insulin aspart (novoLOG) injection 0-9 Units (has no administration in time range)  insulin aspart (novoLOG) injection 0-5 Units (has no administration in time range)    ED Course/ Medical Decision Making/ A&P                                 Medical Decision Making 67 year old female with past medical history of atrial fibrillation on Eliquis as well as known gastric ulcers and AVMs presenting to the emergency department today with concern for melena and low hemoglobin.  I will repeat labs here.  Will obtain type and screen in the event the patient requires transfusion.  I will hold off on any abdominal imaging as patient has no abdominal pain at this time.  Will give her a dose of Protonix here.  Looking back through her most recent notes there was no mention of varices.  I will reevaluate for ultimate disposition  she will likely require admission.  The patient's hemoglobin did come Accu-Temp 2.1.  After discussing her case with Dr. Trilby Drummer the will give the patient a unit of blood GI follow the patient and patient.  Amount and/or Complexity of Data Reviewed Labs: ordered.  Risk Prescription drug management. Decision regarding hospitalization.           Final Clinical Impression(s) / ED Diagnoses Final diagnoses:  Symptomatic anemia  Melena  Dispo: admit  Rx / DC Orders ED Discharge Orders     None         Durwin Glaze, MD 07/15/23 2319

## 2023-07-15 NOTE — H&P (Signed)
[facility]   PATIENT NAME: Lynn [name]    MR#:  [id number]  DATE OF BIRTH:  02-Jun-1956  DATE OF ADMISSION:  07/15/2023  PRIMARY CARE PHYSICIAN: Copland, Gwenlyn Found, MD   Patient is coming from: Home  REQUESTING/REFERRING PHYSICIAN: Durwin Glaze, MD   CHIEF COMPLAINT:   Chief Complaint  Patient presents with   Abnormal Lab    HISTORY OF PRESENT ILLNESS:  Lynn [name] is a 67 y.o. African-American female with medical history significant for gastric ulcer/AVMs, GERD, hypertension, dyslipidemia, diastolic CHF, COPD, coronary artery disease, CVA, OSA, and type 2 diabetes mellitus as well as rheumatoid arthritis and PAD, as well as paroxysmal atrial fibrillation on Eliquis, who presented to the emergency room with acute onset of abnormal lab with low hemoglobin today.  She has been having without melanotic stools over the past week.  She admitted to nausea without vomiting.  She admitted to associated dyspnea mainly on exertion with mild dizziness and lightheadedness without presyncope or syncope.  Her hemoglobin last week when it was checked was 7.  She was called today and since she was still having symptoms she was advised to come to the ER.  She had an EGD back in March and has been on PPI since then.  She did not any bright blood bleeding per rectum or bloody vomitus.  No fever but admitted to chills.  No abdominal pain or diarrhea.  No dysuria, oliguria or hematuria or flank pain.  No cough or wheezing or hemoptysis.  No other bleeding diathesis.  No chest pain or palpitations.  No headache or dizziness or presyncope or syncope.  ED Course: When she came to the ER, BP was 140/63 with pulse oximetry of 100% on 3 L O2 by nasal cannula.  Labs revealed borderline potassium of 3.5 with otherwise unremarkable BMP.  LFTs were within normal.  CBC showed hemoglobin of 7.1 and hematocrit 24 compared to 7.3 earlier today and 8 on 9/27 with hematocrit of 24 compared to 27.4 then  and low RBCs indices.  Stool Hemoccult came back positive. EKG as reviewed by me : None. Imaging: None.  The patient was given 4 mg of IV Protonix.  She will be admitted to a medical telemetry bed for further evaluation and management. PAST MEDICAL HISTORY:   Past Medical History:  Diagnosis Date   Allergy 2018   Anxiety    CHF (congestive heart failure) (HCC)    Clotting disorder (HCC)    blood clot in eye 2016   COPD (chronic obstructive pulmonary disease) (HCC)    Coronary artery disease, non-occlusive    a. 11/2016 NSTEMI/Cath: LM nl, LAD 40p, 50md, D1/2 small, LCX 40ost, OM2/3 nl/small, RCA 35 ost/mid, RPDA/RPL small/nl.   Coronary vasospasm (HCC)    Depression    Diastolic dysfunction    a. 11/2016 Echo: EF 55-60%, gr1 DD, sev calcified MV annulus, mildly dil LA.   Eye hemorrhage 01/2015   "right; resolved" (07/03/2017)   Gastric ulcer    GERD (gastroesophageal reflux disease)    Headache    "monthly" (07/03/2017)   Heart murmur    History of blood transfusion    "when I had an ectopic pregnancy"   History of stomach ulcers    Hyperlipidemia    Hypertension    Microcytic anemia    Moderate mitral stenosis by prior echocardiogram 03/2021   TTE: Mod MS (mean gradient 9 mmHg). -- TEE Moderate-Severe MS (mean gradient 14 mmHg) with  fixed Post MV Leaflet & Severe MAC w/ large circumscribed calcified mass on the Post MV Leaflet. (Mass previously described in 2018) - CMRI identifies calcified mass & not Tumor.; R&LHC - LVEDP & PCWP both 23 mmHg indicating minimal resting gradient   Myocardial infarction (HCC) 2023   OSA on CPAP    setting is unknown   Oxygen deficiency    PAF (paroxysmal atrial fibrillation) (HCC) 03/2021   Event Monitor Aug-Sept 2022: Predominantly sinus rhythm.  1% A. fib burden.  30 brief episodes of PAT.  2 short bursts of NSVT.   PAT (paroxysmal atrial tachycardia)    Event monitor from August 2022 showed 30 brief bursts longest 14 seconds.   Pneumonia     "several times" (07/03/2017)   PVC's (premature ventricular contractions)    Rheumatoid arthritis (HCC)    Sickle cell anemia (HCC)    Sleep apnea    Stroke (HCC)    Syncope 07/03/2017 X 2   called seizures but no medications   Thalassemia    "my cells are sickle cell shaped but I don't have sickle cell anemia" (07/03/2017)   Type II diabetes mellitus (HCC)     PAST SURGICAL HISTORY:   Past Surgical History:  Procedure Laterality Date   48-Hour Monitor  03/18/2017   Sinus rhythm with sinus tachycardia (rate 58-134 BPM)multiple PVCs noted with couplets and bigeminy. One triplet. 7 runs of PAT ranging from 100-130 bpm. Longest was 33 beats.   BIOPSY  04/03/2021   Procedure: BIOPSY;  Surgeon: Benancio Deeds, MD;  Location: [facility];  Service: Gastroenterology;;   BIOPSY  12/17/2022   Procedure: BIOPSY;  Surgeon: Hilarie Fredrickson, MD;  Location: [facility];  Service: Gastroenterology;;   BREAST BIOPSY Left    BRONCHIAL WASHINGS  03/28/2021   Procedure: BRONCHIAL WASHINGS;  Surgeon: Steffanie Dunn, DO;  Location: [facility];  Service: Endoscopy;;   CARDIAC CATHETERIZATION N/A 11/19/2016   Procedure: Left Heart Cath and Coronary Angiography;  Surgeon: Lyn Records, MD;  Location: [facility] INVASIVE CV LAB;  Service: Cardiovascular.    LM nl, LAD 40p, 50md, D1/2 small, LCX 40ost, OM2/3 nl/small, RCA 35 ost/mid, RPDA/RPL small/nl.   CARDIAC MRI  03/30/2021   Normal LVEF ~53%. 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 No Scar or Late Gadolinium Enhancement on LV Myocardium.   CARPAL TUNNEL RELEASE Right 11/01/2014   COLONOSCOPY     DILATION AND CURETTAGE OF UTERUS     ECTOPIC PREGNANCY SURGERY  X 2   ENTEROSCOPY N/A 09/05/2022   Procedure: ENTEROSCOPY;  Surgeon: Meridee Score Netty Starring., MD;  Location: [facility];  Service: Gastroenterology;  Laterality: N/A;   ESOPHAGOGASTRODUODENOSCOPY N/A 12/17/2022   Procedure: ESOPHAGOGASTRODUODENOSCOPY  (EGD);  Surgeon: Hilarie Fredrickson, MD;  Location: [facility];  Service: Gastroenterology;  Laterality: N/A;   ESOPHAGOGASTRODUODENOSCOPY (EGD) WITH PROPOFOL N/A 04/03/2021   Procedure: ESOPHAGOGASTRODUODENOSCOPY (EGD) WITH PROPOFOL;  Surgeon: Benancio Deeds, MD;  Location: [facility];  Service: Gastroenterology;  Laterality: N/A;   ESOPHAGOGASTRODUODENOSCOPY (EGD) WITH PROPOFOL N/A 08/15/2021   Procedure: ESOPHAGOGASTRODUODENOSCOPY (EGD) WITH PROPOFOL;  Surgeon: Meryl Dare, MD;  Location: [facility];  Service: Endoscopy;  Laterality: N/A;   HOT HEMOSTASIS N/A 09/05/2022   Procedure: HOT HEMOSTASIS (ARGON PLASMA COAGULATION/BICAP);  Surgeon: Lemar Lofty., MD;  Location: [facility];  Service: Gastroenterology;  Laterality: N/A;   JOINT REPLACEMENT     KNEE ARTHROSCOPY Left 11/2014   RIGHT/LEFT HEART CATH AND CORONARY ANGIOGRAPHY  N/A 08/08/2021   Procedure: RIGHT/LEFT HEART CATH AND CORONARY ANGIOGRAPHY;  Surgeon: Marykay Lex, MD;  Location: [facility] INVASIVE CV LAB;  Service: Cardiovascular;Ost LCx 40%. Ost-Prox RCA 35%. Prox-Mid LAD 25%-<25% D2. Mod-Severely Elevated LVEDP. Mild PA HTN -> PA mean 31 mmHg with a PCWP and LVEDP of 23 mmHg   SUBMUCOSAL TATTOO INJECTION  09/05/2022   Procedure: SUBMUCOSAL TATTOO INJECTION;  Surgeon: Lemar Lofty., MD;  Location: [facility];  Service: Gastroenterology;;   TEE WITHOUT CARDIOVERSION N/A 03/28/2021   Procedure: TRANSESOPHAGEAL ECHOCARDIOGRAM (TEE);  Surgeon: Vesta Mixer, MD;  Location: [facility] ENDOSCOPY;; LVEF 55-60%. Normal LV Fxn & no RWMA. No LAA thrombus. Gr II Ao Plaque. Large circumscribed calcific mass (1.8 x 1.4 cm) anterior to posterior annulus.  Fixed posterior leaflet with Mod-Severe MS (Mean MVG ~14 mmHg, Peak 20 mmhg).  Mild MR   TONSILLECTOMY     TOTAL KNEE ARTHROPLASTY Right 02/18/2015   Procedure: RIGHT TOTAL KNEE ARTHROPLASTY;  Surgeon: Beverely Low, MD;  Location: [facility] OR;  Service: Orthopedics;   Laterality: Right;   TOTAL KNEE ARTHROPLASTY Left 08/19/2015   Procedure: LEFT TOTAL KNEE ARTHROPLASTY;  Surgeon: Beverely Low, MD;  Location: [facility] OR;  Service: Orthopedics;  Laterality: Left;   TRANSTHORACIC ECHOCARDIOGRAM  11/2016; 07/2017:   a) normal LV size, thickness and function. EF 55-60%. GR 1 DD.No RWMA severe mitral annular calcification, but no mitral stenosis. Mild LA dilation;; b) normal LV size and function.  EF 65-75%.  Unable to assess diastolic function.  Aortic sclerosis with no stenosis.  Moderate mitral stenosis? (Gradient 9 mmHg) -?  Mild-mod left atrial enlargement    TRANSTHORACIC ECHOCARDIOGRAM  01/2019   EF 65 to 70%.  Normal function.  Elevated LVEDP-GR 1 DD.  Normal RV size and function.  Large calcific mass on posterior mitral leaflet mild to moderate mitral stenosis.   TRANSTHORACIC ECHOCARDIOGRAM  03/24/2021   EF 60 to 65%.  LV function with normal wall motion.  Moderate basal septal LVH.  GRII DD & Mild LA dilation.-> elevated LVEDP.  Normal RV function.  Mildly elevated PAP.  Ill-defined density measuring 3.23 x 2.1 cm region of the posterior MV leaflet along with dense MAC.  Moderate MS (mean MVG of 9 mmHg.) Normal AoV & RAP. --> Recommend TEE 2/2 concern for MV SBE.   TRANSTHORACIC ECHOCARDIOGRAM  04/26/2022   EF 60 to 65%.  No RWMA.  Moderate LVH with GR 1 DD.  Normal RV size and function.  Mild LA dilation.  Large calcific mass of posterior MV leaflet.  Mean PG 13 mm 3.  Trivial MR.  Mild to moderate MS.  Severe MAC.  Normal AOV.  Normal RAP.   VAGINAL HYSTERECTOMY     VIDEO BRONCHOSCOPY N/A 03/28/2021   Procedure: VIDEO BRONCHOSCOPY WITHOUT FLUORO;  Surgeon: Steffanie Dunn, DO;  Location: [facility] ENDOSCOPY;  Service: Endoscopy;  Laterality: N/A;    SOCIAL HISTORY:   Social History   Tobacco Use   Smoking status: Former    Current packs/day: 0.00    Average packs/day: 0.3 packs/day for 44.0 years (11.0 ttl pk-yrs)    Types: Cigarettes    Start date: 02/17/1971     Quit date: 02/17/2015    Years since quitting: 8.4   Smokeless tobacco: Never  Substance Use Topics   Alcohol use: Not Currently    Comment: 07/03/2017 "might have a few drinks/year"    FAMILY HISTORY:   Family History  Problem Relation Age of Onset   Cancer  Mother    Diabetes Mother    Hypertension Mother    Hyperlipidemia Mother    Cancer Father    Hyperlipidemia Father    Mental illness Sister    Diabetes Sister    Diabetes Maternal Grandmother    Diabetes Maternal Grandfather    Colon cancer Neg Hx    Esophageal cancer Neg Hx    Stomach cancer Neg Hx    Rectal cancer Neg Hx    Tremor Neg Hx    Parkinson's disease Neg Hx     DRUG ALLERGIES:   Allergies  Allergen Reactions   Methotrexate Other (See Comments)    Pneumonitis   Penicillins Hives    Childhood allergy Has patient had a PCN reaction causing immediate rash, facial/tongue/throat swelling, SOB or lightheadedness with hypotension: Yes Has patient had a PCN reaction causing severe rash involving mucus membranes or skin necrosis: No Has patient had a PCN reaction that required hospitalization No Has patient had a PCN reaction occurring within the last 10 years: No If all of the above answers are "NO", then may proceed with Cephalosporin use.   Marcelline Deist [Dapagliflozin] Other (See Comments)    Dizzy and lethargic    Metformin And Related Diarrhea and Other (See Comments)    Also, bleeding    REVIEW OF SYSTEMS:   ROS As per history of present illness. All pertinent systems were reviewed above. Constitutional, HEENT, cardiovascular, respiratory, GI, GU, musculoskeletal, neuro, psychiatric, endocrine, integumentary and hematologic systems were reviewed and are otherwise negative/unremarkable except for positive findings mentioned above in the HPI.   MEDICATIONS AT HOME:   Prior to Admission medications   Medication Sig Start Date End Date Taking? Authorizing Provider  acetaminophen (TYLENOL) 500 MG tablet  Take 1,000 mg by mouth every 6 (six) hours as needed (for pain).   Yes [provider]  albuterol (PROVENTIL) (2.5 MG/3ML) 0.083% nebulizer solution Take 3 mLs (2.5 mg total) by nebulization every 6 (six) hours as needed for wheezing or shortness of breath. Patient taking differently: Take 2.5 mg by nebulization See admin instructions. Nebulize 2.5 mg and inhale into the lungs at bedtime- may use an additional 2.5 mg up to three times a day as needed for wheezing or shortness of breath 12/20/22  Yes Cobb, Ruby Cola, NP  albuterol (VENTOLIN HFA) 108 (90 Base) MCG/ACT inhaler TAKE 2 PUFFS BY MOUTH EVERY 6 HOURS AS NEEDED FOR WHEEZE OR SHORTNESS OF BREATH Patient taking differently: Inhale 2 puffs into the lungs every 6 (six) hours as needed for wheezing or shortness of breath. 05/31/23  Yes Kalman Shan, MD  ALPRAZolam Prudy Feeler) 0.25 MG tablet Take 1-2 tablets (0.25-0.5 mg total) by mouth 2 (two) times daily as needed for anxiety. Can also take a dose as needed for insomnia Patient taking differently: Take 0.25-0.5 mg by mouth 2 (two) times daily as needed for anxiety (may also take an additional 0.25-0.5 mg at bedtime as needed for insomnia). 06/24/23  Yes Copland, Gwenlyn Found, MD  apixaban (ELIQUIS) 5 MG TABS tablet Take 1 tablet (5 mg total) by mouth 2 (two) times daily. Resume taking on 12/19/22 12/18/22  Yes Briant Cedar, MD  atorvastatin (LIPITOR) 80 MG tablet Take 1 tablet (80 mg total) by mouth every evening. Patient taking differently: Take 80 mg by mouth daily. 09/19/22  Yes Marykay Lex, MD  cetirizine (ZYRTEC) 10 MG tablet Take 10 mg by mouth daily as needed for allergies.   Yes [provider]  Continuous Glucose  Sensor (DEXCOM G7 SENSOR) MISC 1 Device by Does not apply route as directed. 04/22/23  Yes [name], Konrad Dolores, MD  diltiazem (CARDIZEM CD) 360 MG 24 hr capsule Take 1 capsule (360 mg total) by mouth daily. 01/24/23  Yes Marykay Lex, MD  folic acid  (FOLVITE) 1 MG tablet Take 2 tablets (2 mg total) by mouth daily. Patient taking differently: Take 1 mg by mouth daily. 01/21/20  Yes Copland, Gwenlyn Found, MD  furosemide (LASIX) 20 MG tablet DECREASE LASIX ( FUROSEMIDE) TO 20 MG ( 1/2/TABLET) , WITH 20 MG YOU MAY TAKE A AN ADDITIONAL 20 MG IF NEED FOR SWELLING OR WEIGHT GAIN MORE THAN 3 LBS OVERNIGHT OR 5 LBS IN ONE WEEK -- IF YOU HAVE CONTINUE TO TAKE AN ADDITIONAL 20 MG , GO BACK TO 40 MG TAKE ADDITIONAL DOSE AS NEEDED FOR EGT GAIN> 3 LB IN 1 DAY Patient taking differently: Take 20 mg by mouth See admin instructions. Take 20 mg by mouth in the morning and an additional 20 mg for an overnight weight gain of 3 pounds or 5 pounds in a week 03/20/23  Yes Marykay Lex, MD  hydroxychloroquine (PLAQUENIL) 200 MG tablet Take 200 mg by mouth 2 (two) times daily. 05/30/22  Yes [provider]  Insulin Disposable Pump (OMNIPOD 5 G6 PODS, GEN 5,) MISC Inject 1 Device into the skin every 3 (three) days.   Yes [provider]  insulin lispro (HUMALOG) 100 UNIT/ML injection Max Daily 100 units per pump Patient taking differently: 100 Units See admin instructions. Max Daily 100 units per pump- every three days 05/13/23  Yes [name], Konrad Dolores, MD  isosorbide mononitrate (IMDUR) 30 MG 24 hr tablet Take 1 tablet (30 mg total) by mouth daily. 09/19/22  Yes Marykay Lex, MD  leflunomide (ARAVA) 20 MG tablet Take 20 mg by mouth daily. 08/16/21  Yes [provider]  losartan (COZAAR) 100 MG tablet Take 0.5 tablets (50 mg total) by mouth daily. 09/19/22  Yes Marykay Lex, MD  Melatonin 5 MG CHEW Chew 10 mg by mouth at bedtime.   Yes [provider]  Multiple Vitamin (MULTIVITAMIN WITH MINERALS) TABS tablet Take 1 tablet by mouth daily with breakfast.   Yes [provider]  Oxymetazoline HCl (VICKS SINEX NA) Place 1 spray into both nostrils every 12 (twelve) hours as needed (for congestion- MAX OF THREE CONSECUTIVE  DAYS).   Yes [provider]  pantoprazole (PROTONIX) 40 MG tablet Take 1 tablet (40 mg total) by mouth daily. 12/31/22 12/31/23 Yes Meryl Dare, MD  sodium chloride (OCEAN) 0.65 % SOLN nasal spray Place 1 spray into both nostrils as needed. Patient taking differently: Place 1 spray into both nostrils as needed for congestion. 07/11/23  Yes Ashok Croon, MD  tirzepatide [facility]) 10 MG/0.5ML Pen Inject 10 mg into the skin once a week. Patient taking differently: Inject 10 mg into the skin every Sunday. 04/22/23  Yes [name], Konrad Dolores, MD  traZODone (DESYREL) 50 MG tablet Take 0.5-1 tablets (25-50 mg total) by mouth at bedtime as needed. for sleep Patient taking differently: Take 50 mg by mouth at bedtime. 05/15/23  Yes Copland, Gwenlyn Found, MD  Continuous Blood Gluc Transmit (DEXCOM G6 TRANSMITTER) MISC 1 Device by Does not apply route as directed. 10/19/22   [name], Konrad Dolores, MD  glucose blood (CONTOUR NEXT TEST) test strip 3x daily 07/20/19   [name], Konrad Dolores, MD  Insulin Disposable Pump (OMNIPOD 5 G7 INTRO, GEN  5,) KIT 1 Device by Does not apply route every other day. Patient not taking: Reported on 07/15/2023 05/13/23   [name], Konrad Dolores, MD  Insulin Disposable Pump (OMNIPOD 5 G7 PODS, GEN 5,) MISC 1 Device by Does not apply route every other day. Patient not taking: Reported on 07/15/2023 07/01/23   [name], Konrad Dolores, MD  Insulin Pen Needle (B-D UF III MINI PEN NEEDLES) 31G X 5 MM MISC USE DAILY WITH VICTOZA AND LANTUS. Patient taking differently: Use for ozempic 07/20/19   [name], Konrad Dolores, MD  Insulin Pen Needle 31G X 5 MM MISC 1 Device by Does not apply route 3 (three) times daily. 01/21/20   Copland, Gwenlyn Found, MD  ketoconazole (NIZORAL) 2 % cream Apply 1 Application topically daily. Patient not taking: Reported on 07/15/2023 11/12/22   Louann Sjogren, DPM  Lancets 30G MISC 1 Device by Does not apply route 3 (three) times  daily. 07/16/19   Copland, Gwenlyn Found, MD  oxymetazoline (AFRIN NASAL SPRAY) 0.05 % nasal spray Place 2 sprays into both nostrils 2 (two) times daily as needed for congestion. Do not use for longer than 2 days Patient not taking: Reported on 07/15/2023 07/11/23   Ashok Croon, MD  SAFETY-LOK TB SYRINGE 27GX.5" 27G X 1/2" 1 ML MISC  08/18/19   [provider]      VITAL SIGNS:  Blood pressure (!) 142/63, pulse 79, temperature 98.3 F (36.8 C), temperature source Oral, resp. rate 19, height 5\' 3"  (1.6 m), weight 107.5 kg, SpO2 100%.  PHYSICAL EXAMINATION:  Physical Exam  GENERAL:  67 y.o.-year-old African American female patient lying in the bed with no acute distress.  EYES: Pupils equal, round, reactive to light and accommodation. No scleral icterus.  Positive pallor.     Extraocular muscles intact.  HEENT: Head atraumatic, normocephalic. Oropharynx and nasopharynx clear.  NECK:  Supple, no jugular venous distention. No thyroid enlargement, no tenderness.  LUNGS: Normal breath sounds bilaterally, no wheezing, rales,rhonchi or crepitation. No use of accessory muscles of respiration.  CARDIOVASCULAR: Regular rate and rhythm, S1, S2 normal. No murmurs, rubs, or gallops.  ABDOMEN: Soft, nondistended, nontender. Bowel sounds present. No organomegaly or mass.  EXTREMITIES: No pedal edema, cyanosis, or clubbing.  NEUROLOGIC: Cranial nerves II through XII are intact. Muscle strength 5/5 in all extremities. Sensation intact. Gait not checked.  PSYCHIATRIC: The patient is alert and oriented x 3.  Normal affect and good eye contact. SKIN: No obvious rash, lesion, or ulcer.   LABORATORY PANEL:   CBC Recent Labs  Lab 07/15/23 1743  WBC 9.2  HGB 7.1*  HCT 24.0*  PLT 243   ------------------------------------------------------------------------------------------------------------------  Chemistries  Recent Labs  Lab 07/15/23 1743  NA 140  K 3.5  CL 100  CO2 29  GLUCOSE 113*   BUN 13  CREATININE 1.11*  CALCIUM 8.9  AST 29  ALT 19  ALKPHOS 66  BILITOT 0.8   ------------------------------------------------------------------------------------------------------------------  Cardiac Enzymes No results for input(s): "TROPONINI" in the last 168 hours. ------------------------------------------------------------------------------------------------------------------  RADIOLOGY:  No results found.    IMPRESSION AND PLAN:  Assessment and Plan: * GI bleeding - This is highly suspicious for upper GI etiology especially given history of gastric ulcer and current melena. - The patient will be admitted to a medically monitored bed. - Will follow serial hemoglobins and hematocrits. - We will continue IV Protonix. - We will hold off Eliquis. - GI consult will be obtained in AM. - I notified Quentin Mulling, PA-C and [facility]  team.  ABLA (acute blood loss anemia) - This is clearly secondary to #1. - The patient was typed and crossmatch and will be transfused 1 unit of packed red blood cells. - Will follow posttransfusion and serial H&H.  Essential hypertension - We will can do her antihypertensive therapy.  Dyslipidemia - We will continue statin therapy.  Rheumatoid arthritis (HCC) - We will continue her Plaquenil.  Type 2 diabetes mellitus without complications (HCC) - The patient will be placed on supplemental coverage with NovoLog. - We can resume her pump as appropriate.  Anxiety - We will can change her Xanax.     DVT prophylaxis: SCDs.  Medical prophylaxis contraindicated due to GI bleeding. Advanced Care Planning:  Code Status: full code. Family Communication:  The plan of care was discussed in details with the patient (and family). I answered all questions. The patient agreed to proceed with the above mentioned plan. Further management will depend upon hospital course. Disposition Plan: Back to previous home environment Consults called:  Gastroenterology. All the records are reviewed and case discussed with ED provider.  Status is: Inpatient  At the time of the admission, it appears that the appropriate admission status for this patient is inpatient.  This is judged to be reasonable and necessary in order to provide the required intensity of service to ensure the patient's safety given the presenting symptoms, physical exam findings and initial radiographic and laboratory data in the context of comorbid conditions.  The patient requires inpatient status due to high intensity of service, high risk of further deterioration and high frequency of surveillance required.  I certify that at the time of admission, it is my clinical judgment that the patient will require inpatient hospital care extending more than 2 midnights.                            Dispo: The patient is from: Home              Anticipated d/c is to: Home              Patient currently is not medically stable to d/c.              Difficult to place patient: No  Hannah Beat M.D on 07/15/2023 at 7:57 PM  [facility]   From 7 PM-7 AM, contact night-coverage [email or web address]  CC: Primary care physician; Copland, Gwenlyn Found, MD

## 2023-07-15 NOTE — ED Notes (Signed)
ED TO INPATIENT HANDOFF REPORT  Name/Age/Gender Lynn [name] 67 y.o. female  Code Status    Code Status Orders  (From admission, onward)           Start     Ordered   07/15/23 1924  Full code  Continuous       Question:  By:  Answer:  Consent: discussion documented in EHR   07/15/23 1925           Code Status History     Date Active Date Inactive Code Status Order ID Comments User Context   12/15/2022 0938 12/18/2022 1836 Full Code [id number]  Maryln Gottron, MD ED   09/04/2022 2233 09/06/2022 1811 Full Code [id number]  Lurline Del, MD Inpatient   11/13/2021 2119 11/15/2021 2054 Full Code [id number]  Anselm Jungling, DO ED   08/08/2021 1439 08/08/2021 2303 Full Code [id number]  Marykay Lex, MD Inpatient   03/23/2021 2149 04/03/2021 2242 Full Code [id number]  Howerter, Chaney Born, DO ED   08/12/2017 2208 08/14/2017 1929 Full Code [id number]  Jonah Blue, MD ED   07/03/2017 1136 07/05/2017 1831 Full Code [id number]  Ozella Rocks, MD ED   11/19/2016 0951 11/20/2016 1506 Full Code [id number]  Lyn Records, MD Inpatient   11/16/2016 1750 11/19/2016 0951 Full Code [id number]  Arty Baumgartner, NP Inpatient   08/19/2015 1739 08/22/2015 1528 Full Code [id number]  Beverely Low, MD Inpatient   02/18/2015 1429 02/21/2015 1554 Full Code [id number]  Beverely Low, MD Inpatient       Home/SNF/Other Home  Chief Complaint GI bleeding [K92.2]  Level of Care/Admitting Diagnosis ED Disposition     ED Disposition  Admit   Condition  --   Comment  Hospital Area: [facility] [[id number]]  Level of Care: Telemetry [5]  Admit to tele based on following criteria: Other see comments  Comments: gib  May admit patient to [facility] or [facility] if equivalent level of care is available:: No  Covid Evaluation: Asymptomatic - no recent exposure (last 10 days) testing not required  Diagnosis: GI bleeding [315176]  Admitting Physician: Hannah Beat [[id number]]  Attending  Physician: Hannah Beat [[id number]]  Certification:: I certify this patient will need inpatient services for at least 2 midnights  Expected Medical Readiness: 07/17/2023          Medical History Past Medical History:  Diagnosis Date   Allergy 2018   Anxiety    CHF (congestive heart failure) (HCC)    Clotting disorder (HCC)    blood clot in eye 2016   COPD (chronic obstructive pulmonary disease) (HCC)    Coronary artery disease, non-occlusive    a. 11/2016 NSTEMI/Cath: LM nl, LAD 40p, 50md, D1/2 small, LCX 40ost, OM2/3 nl/small, RCA 35 ost/mid, RPDA/RPL small/nl.   Coronary vasospasm (HCC)    Depression    Diastolic dysfunction    a. 11/2016 Echo: EF 55-60%, gr1 DD, sev calcified MV annulus, mildly dil LA.   Eye hemorrhage 01/2015   "right; resolved" (07/03/2017)   Gastric ulcer    GERD (gastroesophageal reflux disease)    Headache    "monthly" (07/03/2017)   Heart murmur    History of blood transfusion    "when I had an ectopic pregnancy"   History of stomach ulcers    Hyperlipidemia    Hypertension    Microcytic anemia    Moderate mitral stenosis by prior echocardiogram 03/2021   TTE: Mod MS (mean gradient 9  mmHg). -- TEE Moderate-Severe MS (mean gradient 14 mmHg) with fixed Post MV Leaflet & Severe MAC w/ large circumscribed calcified mass on the Post MV Leaflet. (Mass previously described in 2018) - CMRI identifies calcified mass & not Tumor.; R&LHC - LVEDP & PCWP both 23 mmHg indicating minimal resting gradient   Myocardial infarction (HCC) 2023   OSA on CPAP    setting is unknown   Oxygen deficiency    PAF (paroxysmal atrial fibrillation) (HCC) 03/2021   Event Monitor Aug-Sept 2022: Predominantly sinus rhythm.  1% A. fib burden.  30 brief episodes of PAT.  2 short bursts of NSVT.   PAT (paroxysmal atrial tachycardia)    Event monitor from August 2022 showed 30 brief bursts longest 14 seconds.   Pneumonia    "several times" (07/03/2017)   PVC's (premature ventricular  contractions)    Rheumatoid arthritis (HCC)    Sickle cell anemia (HCC)    Sleep apnea    Stroke (HCC)    Syncope 07/03/2017 X 2   called seizures but no medications   Thalassemia    "my cells are sickle cell shaped but I don't have sickle cell anemia" (07/03/2017)   Type II diabetes mellitus (HCC)     Allergies Allergies  Allergen Reactions   Methotrexate Other (See Comments)    Pneumonitis   Penicillins Hives    Childhood allergy Has patient had a PCN reaction causing immediate rash, facial/tongue/throat swelling, SOB or lightheadedness with hypotension: Yes Has patient had a PCN reaction causing severe rash involving mucus membranes or skin necrosis: No Has patient had a PCN reaction that required hospitalization No Has patient had a PCN reaction occurring within the last 10 years: No If all of the above answers are "NO", then may proceed with Cephalosporin use.   Farxiga [Dapagliflozin] Other (See Comments)    Dizzy and lethargic    Metformin And Related Diarrhea and Other (See Comments)    Also, bleeding    IV Location/Drains/Wounds Patient Lines/Drains/Airways Status     Active Line/Drains/Airways     None            Labs/Imaging Results for orders placed or performed during the hospital encounter of 07/15/23 (from the past 48 hour(s))  CBC with Differential     Status: Abnormal   Collection Time: 07/15/23  5:43 PM  Result Value Ref Range   WBC 9.2 4.0 - 10.5 K/uL   RBC 3.79 (L) 3.87 - 5.11 MIL/uL   Hemoglobin 7.1 (L) 12.0 - 15.0 g/dL    Comment: Reticulocyte Hemoglobin testing may be clinically indicated, consider ordering this additional test [id number]    HCT 24.0 (L) 36.0 - 46.0 %   MCV 63.3 (L) 80.0 - 100.0 fL   MCH 18.7 (L) 26.0 - 34.0 pg   MCHC 29.6 (L) 30.0 - 36.0 g/dL   RDW 81.1 (H) 91.4 - 78.2 %   Platelets 243 150 - 400 K/uL    Comment: CONSISTENT WITH PREVIOUS RESULT REPEATED TO VERIFY    nRBC 0.0 0.0 - 0.2 %   Neutrophils Relative %  71 %   Neutro Abs 6.6 1.7 - 7.7 K/uL   Lymphocytes Relative 15 %   Lymphs Abs 1.3 0.7 - 4.0 K/uL   Monocytes Relative 11 %   Monocytes Absolute 1.0 0.1 - 1.0 K/uL   Eosinophils Relative 2 %   Eosinophils Absolute 0.2 0.0 - 0.5 K/uL   Basophils Relative 1 %   Basophils Absolute 0.1 0.0 - 0.1  K/uL   Immature Granulocytes 0 %   Abs Immature Granulocytes 0.03 0.00 - 0.07 K/uL   Schistocytes PRESENT    Ovalocytes PRESENT     Comment: Performed at [facility], [address], [place], [place] [address]  Comprehensive metabolic panel     Status: Abnormal   Collection Time: 07/15/23  5:43 PM  Result Value Ref Range   Sodium 140 135 - 145 mmol/L   Potassium 3.5 3.5 - 5.1 mmol/L   Chloride 100 98 - 111 mmol/L   CO2 29 22 - 32 mmol/L   Glucose, Bld 113 (H) 70 - 99 mg/dL    Comment: Glucose reference range applies only to samples taken after fasting for at least 8 hours.   BUN 13 8 - 23 mg/dL   Creatinine, Ser 6.04 (H) 0.44 - 1.00 mg/dL   Calcium 8.9 8.9 - 54.0 mg/dL   Total Protein 7.3 6.5 - 8.1 g/dL   Albumin 3.7 3.5 - 5.0 g/dL   AST 29 15 - 41 U/L   ALT 19 0 - 44 U/L   Alkaline Phosphatase 66 38 - 126 U/L   Total Bilirubin 0.8 0.3 - 1.2 mg/dL   GFR, Estimated 54 (L) >60 mL/min    Comment: (NOTE) Calculated using the CKD-EPI Creatinine Equation (2021)    Anion gap 11 5 - 15    Comment: Performed at [facility], [address] [place], [place] [address]  Type and screen [facility]     Status: None (Preliminary result)   Collection Time: 07/15/23  5:45 PM  Result Value Ref Range   ABO/RH(D) PENDING    Antibody Screen PENDING    Sample Expiration      07/18/2023,2359 Performed at [facility], [address], [place], [place] [address]    No results found.  Pending Labs Unresulted Labs (From admission, onward)     Start     Ordered   07/15/23 2300  Hemoglobin and hematocrit, blood  Now then every  6 hours,   R (with TIMED occurrences)      07/15/23 1925   07/15/23 1920  Prepare RBC (crossmatch)  (Blood Administration Adult)  Once,   R       Question Answer Comment  # of Units 1 unit   Transfusion Indications Hemoglobin 8 gm/dL or less and orthopedic or cardiac surgery or pre-existing cardiac condition   Number of Units to Keep Ahead NO units ahead   If emergent release call blood bank Not emergent release      07/15/23 1920            Vitals/Pain Today's Vitals   07/15/23 1715 07/15/23 1716  BP: (!) 142/63   Pulse: 79   Resp: 19   Temp: 98.3 F (36.8 C)   TempSrc: Oral   SpO2: 100%   Weight:  107.5 kg  Height:  5\' 3"  (1.6 m)    Isolation Precautions No active isolations  Medications Medications  0.9 %  sodium chloride infusion (Manually program via Guardrails IV Fluids) (has no administration in time range)  pantoprazole (PROTONIX) injection 40 mg (has no administration in time range)  leflunomide (ARAVA) tablet 20 mg (has no administration in time range)  hydroxychloroquine (PLAQUENIL) tablet 200 mg (has no administration in time range)  atorvastatin (LIPITOR) tablet 80 mg (has no administration in time range)  diltiazem (CARDIZEM CD) 24 hr capsule 360 mg (has no administration in time range)  isosorbide mononitrate (IMDUR) 24 hr tablet 30 mg (has no administration in time range)  losartan (COZAAR) tablet 50 mg (has no administration in time range)  ALPRAZolam (XANAX) tablet 0.25-0.5 mg (has no administration in time range)  folic acid (FOLVITE) tablet 1 mg (has no administration in time range)  albuterol (PROVENTIL) (2.5 MG/3ML) 0.083% nebulizer solution 2.5 mg (has no administration in time range)  loratadine (CLARITIN) tablet 10 mg (has no administration in time range)  sodium chloride (OCEAN) 0.65 % nasal spray 1 spray (has no administration in time range)  acetaminophen (TYLENOL) tablet 650 mg (has no administration in time range)    Or  acetaminophen  (TYLENOL) suppository 650 mg (has no administration in time range)  traZODone (DESYREL) tablet 25 mg (has no administration in time range)  ondansetron (ZOFRAN) tablet 4 mg (has no administration in time range)    Or  ondansetron (ZOFRAN) injection 4 mg (has no administration in time range)  0.9 % NaCl with KCl 20 mEq/ L  infusion (has no administration in time range)  albuterol (PROVENTIL) (2.5 MG/3ML) 0.083% nebulizer solution 2.5 mg (has no administration in time range)  melatonin tablet 10 mg (has no administration in time range)    Mobility walks with person assist

## 2023-07-15 NOTE — Assessment & Plan Note (Signed)
-   We will continue her Plaquenil.

## 2023-07-15 NOTE — ED Notes (Signed)
RN attempted to start IV x2. Patient requested IV team.

## 2023-07-15 NOTE — Assessment & Plan Note (Addendum)
-   The patient will be placed on supplemental coverage with NovoLog. - We can resume her pump as appropriate.

## 2023-07-15 NOTE — Assessment & Plan Note (Signed)
-   We will continue statin therapy. 

## 2023-07-15 NOTE — ED Triage Notes (Addendum)
Patient was told she has an upper GI bleed and a hemoglobin of 7. No vomiting. Stated she is having dark stools. Feels weak. Takes Eloquis.

## 2023-07-15 NOTE — Progress Notes (Signed)
Patient [place] piv to right forearm infiltrated, no blood return, and pain with flushing. New PIV started to right forearm 22g, good blood return noted.   Consents for blood transfusion obtained.

## 2023-07-16 ENCOUNTER — Telehealth: Payer: Self-pay | Admitting: Cardiology

## 2023-07-16 DIAGNOSIS — D62 Acute posthemorrhagic anemia: Secondary | ICD-10-CM | POA: Diagnosis not present

## 2023-07-16 DIAGNOSIS — Z7901 Long term (current) use of anticoagulants: Secondary | ICD-10-CM

## 2023-07-16 DIAGNOSIS — Z8711 Personal history of peptic ulcer disease: Secondary | ICD-10-CM | POA: Diagnosis not present

## 2023-07-16 DIAGNOSIS — K921 Melena: Secondary | ICD-10-CM | POA: Diagnosis not present

## 2023-07-16 DIAGNOSIS — K5520 Angiodysplasia of colon without hemorrhage: Secondary | ICD-10-CM | POA: Diagnosis not present

## 2023-07-16 DIAGNOSIS — R195 Other fecal abnormalities: Secondary | ICD-10-CM | POA: Diagnosis not present

## 2023-07-16 DIAGNOSIS — J449 Chronic obstructive pulmonary disease, unspecified: Secondary | ICD-10-CM | POA: Diagnosis present

## 2023-07-16 DIAGNOSIS — E66813 Obesity, class 3: Secondary | ICD-10-CM | POA: Diagnosis present

## 2023-07-16 LAB — GLUCOSE, CAPILLARY
Glucose-Capillary: 152 mg/dL — ABNORMAL HIGH (ref 70–99)
Glucose-Capillary: 129 mg/dL — ABNORMAL HIGH (ref 70–99)
Glucose-Capillary: 156 mg/dL — ABNORMAL HIGH (ref 70–99)
Glucose-Capillary: 171 mg/dL — ABNORMAL HIGH (ref 70–99)

## 2023-07-16 LAB — BASIC METABOLIC PANEL
Anion gap: 9 (ref 5–15)
BUN: 13 mg/dL (ref 8–23)
CO2: 27 mmol/L (ref 22–32)
Calcium: 8.5 mg/dL — ABNORMAL LOW (ref 8.9–10.3)
Chloride: 100 mmol/L (ref 98–111)
Creatinine, Ser: 1.01 mg/dL — ABNORMAL HIGH (ref 0.44–1.00)
GFR, Estimated: >60 mL/min (ref >60)
Glucose, Bld: 135 mg/dL — ABNORMAL HIGH (ref 70–99)
Potassium: 3.1 mmol/L — ABNORMAL LOW (ref 3.5–5.1)
Sodium: 136 mmol/L (ref 135–145)

## 2023-07-16 LAB — HEMOGLOBIN AND HEMATOCRIT, BLOOD
HCT: 26.1 % — ABNORMAL LOW (ref 36.0–46.0)
HCT: 24.1 % — ABNORMAL LOW (ref 36.0–46.0)
Hemoglobin: 7.6 g/dL — ABNORMAL LOW (ref 12.0–15.0)
Hemoglobin: 7 g/dL — ABNORMAL LOW (ref 12.0–15.0)

## 2023-07-16 LAB — CBC
HCT: 24.3 % — ABNORMAL LOW (ref 36.0–46.0)
Hemoglobin: 7.1 g/dL — ABNORMAL LOW (ref 12.0–15.0)
MCH: 19.2 pg — ABNORMAL LOW (ref 26.0–34.0)
MCHC: 29.2 g/dL — ABNORMAL LOW (ref 30.0–36.0)
MCV: 65.9 fL — ABNORMAL LOW (ref 80.0–100.0)
Platelets: 210 10*3/uL (ref 150–400)
RBC: 3.69 MIL/uL — ABNORMAL LOW (ref 3.87–5.11)
RDW: 18.5 % — ABNORMAL HIGH (ref 11.5–15.5)
WBC: 8.1 10*3/uL (ref 4.0–10.5)
nRBC: 0 % (ref 0.0–0.2)

## 2023-07-16 LAB — PREPARE RBC (CROSSMATCH)

## 2023-07-16 MED ORDER — ALBUTEROL SULFATE (2.5 MG/3ML) 0.083% IN NEBU
2.5000 mg | INHALATION_SOLUTION | Freq: Every day | RESPIRATORY_TRACT | Status: DC
  Administered 2023-07-17 – 2023-07-19 (×3): 2.5 mg via RESPIRATORY_TRACT
  Filled 2023-07-16 (×3): qty 3

## 2023-07-16 MED ORDER — TRAZODONE HCL 50 MG PO TABS
50.0000 mg | ORAL_TABLET | Freq: Every day | ORAL | Status: DC
  Administered 2023-07-16 – 2023-07-23 (×8): 50 mg via ORAL
  Filled 2023-07-16 (×8): qty 1

## 2023-07-16 MED ORDER — SODIUM CHLORIDE 0.9% IV SOLUTION: Freq: Once | INTRAVENOUS | Status: AC

## 2023-07-16 MED ORDER — INFLUENZA VAC A&B SURF ANT ADJ 0.5 ML IM SUSY
0.5000 mL | PREFILLED_SYRINGE | INTRAMUSCULAR | Status: AC
  Administered 2023-07-17: 0.5 mL via INTRAMUSCULAR
  Filled 2023-07-16: qty 0.5

## 2023-07-16 MED ORDER — ALBUTEROL SULFATE (2.5 MG/3ML) 0.083% IN NEBU
2.5000 mg | INHALATION_SOLUTION | Freq: Four times a day (QID) | RESPIRATORY_TRACT | Status: DC | PRN
  Administered 2023-07-18: 2.5 mg via RESPIRATORY_TRACT
  Filled 2023-07-16: qty 3

## 2023-07-16 NOTE — Progress Notes (Signed)
RT Protocol assessment completed. Patient states she now takes albuterol neb at home once daily in the am and as needed throughout the day. Order changed to reflect home regimen, per patient request.

## 2023-07-16 NOTE — Plan of Care (Signed)
  Problem: Health Behavior/Discharge Planning: Goal: Ability to manage health-related needs will improve Outcome: Progressing   Problem: Clinical Measurements: Goal: Will remain free from infection Outcome: Progressing   

## 2023-07-16 NOTE — Plan of Care (Signed)
  Problem: Health Behavior/Discharge Planning: Goal: Ability to manage health-related needs will improve Outcome: Progressing   Problem: Clinical Measurements: Goal: Will remain free from infection Outcome: Progressing   Problem: Activity: Goal: Risk for activity intolerance will decrease Outcome: Progressing   Problem: Nutrition: Goal: Adequate nutrition will be maintained Outcome: Progressing   Problem: Pain Managment: Goal: General experience of comfort will improve Outcome: Progressing   Problem: Skin Integrity: Goal: Risk for impaired skin integrity will decrease Outcome: Progressing   

## 2023-07-16 NOTE — Telephone Encounter (Signed)
Husband is calling to see if appt for 10/2 can be virtual. Patient is in the hospital. Please advise

## 2023-07-16 NOTE — Consult Note (Addendum)
Consultation  Referring Provider: TRH/ Yates Primary Care Physician:  Pearline Cables, MD Primary Gastroenterologist:  Dr.Stark  Reason for Consultation:   melena, anemia in setting of Eliquis  HPI: Lynn [name] is a 67 y.o. female, established with Dr. Russella Dar, and known to the GI service who was readmitted last night after she had labs done by her PCP.  She had seen her PCP on 927 with complaints of fatigue and dark stools. She has history of atrial fibrillation for which she is on chronic Eliquis, history of recurrent melena/GI bleeding secondary to AVMs and at one point had a clean-based gastric ulcer. She has history of COPD on chronic home O2 at 3 L nasal cannula, prior history of CVA, hyperlipidemia, rheumatoid arthritis on Plaquenil, diabetes mellitus and thalassemia as well as morbid obesity. She had last been evaluated by GI in March 2024 when she was hospitalized with melena and underwent EGD which showed 1 nonbleeding gastric ulcer, and a small hiatal hernia. She underwent enteroscopy in November 2023 for similar presentation with finding of 2 small AVMs in the cardia and antrum of the stomach treated with APC and 1 small AVM in the proximal jejunum also treated with APC. Last colonoscopy 2017 done for rectal bleeding noted multiple diverticuli in the sigmoid, descending and transverse colon, otherwise negative.   Hemoglobin from July 2024 9.2/chronically low MCV secondary to thalassemia. On 07/12/2023 hemoglobin 8/hematocrit 27.4 07/15/2023 hemoglobin 7.1/hematocrit 24 BUN 13/creatinine 1.11 Transfused 1 unit of packed RBCs last night with hemoglobin up to 7.6 and this morning hemoglobin back down to 7.0.  Patient says she feels okay today was fatigued and a little bit lightheaded at times yesterday still feels fatigued but denies any lightheadedness, no shortness of breath, no chest pain, she did have 1 black bowel movement yesterday no bowel movement yet today.   She is usually been having 2 stools per day which have been very dark over the past week, no complaints of abdominal pain or discomfort.  No nausea or vomiting.  Last dose of Eliquis was yesterday morning.  Last echo earlier this year EF 60 to 65% moderate mitral stenosis no AS.     Past Medical History:  Diagnosis Date   Allergy 2018   Anxiety    CHF (congestive heart failure) (HCC)    Clotting disorder (HCC)    blood clot in eye 2016   COPD (chronic obstructive pulmonary disease) (HCC)    Coronary artery disease, non-occlusive    a. 11/2016 NSTEMI/Cath: LM nl, LAD 40p, 50md, D1/2 small, LCX 40ost, OM2/3 nl/small, RCA 35 ost/mid, RPDA/RPL small/nl.   Coronary vasospasm (HCC)    Depression    Diastolic dysfunction    a. 11/2016 Echo: EF 55-60%, gr1 DD, sev calcified MV annulus, mildly dil LA.   Eye hemorrhage 01/2015   "right; resolved" (07/03/2017)   Gastric ulcer    GERD (gastroesophageal reflux disease)    Headache    "monthly" (07/03/2017)   Heart murmur    History of blood transfusion    "when I had an ectopic pregnancy"   History of stomach ulcers    Hyperlipidemia    Hypertension    Microcytic anemia    Moderate mitral stenosis by prior echocardiogram 03/2021   TTE: Mod MS (mean gradient 9 mmHg). -- TEE Moderate-Severe MS (mean gradient 14 mmHg) with fixed Post MV Leaflet & Severe MAC w/ large circumscribed calcified mass on the Post MV Leaflet. (Mass previously described in 2018) -  CMRI identifies calcified mass & not Tumor.; R&LHC - LVEDP & PCWP both 23 mmHg indicating minimal resting gradient   Myocardial infarction (HCC) 2023   OSA on CPAP    setting is unknown   Oxygen deficiency    PAF (paroxysmal atrial fibrillation) (HCC) 03/2021   Event Monitor Aug-Sept 2022: Predominantly sinus rhythm.  1% A. fib burden.  30 brief episodes of PAT.  2 short bursts of NSVT.   PAT (paroxysmal atrial tachycardia)    Event monitor from August 2022 showed 30 brief bursts longest 14  seconds.   Pneumonia    "several times" (07/03/2017)   PVC's (premature ventricular contractions)    Rheumatoid arthritis (HCC)    Sickle cell anemia (HCC)    Sleep apnea    Stroke (HCC)    Syncope 07/03/2017 X 2   called seizures but no medications   Thalassemia    "my cells are sickle cell shaped but I don't have sickle cell anemia" (07/03/2017)   Type II diabetes mellitus (HCC)     Past Surgical History:  Procedure Laterality Date   48-Hour Monitor  03/18/2017   Sinus rhythm with sinus tachycardia (rate 58-134 BPM)multiple PVCs noted with couplets and bigeminy. One triplet. 7 runs of PAT ranging from 100-130 bpm. Longest was 33 beats.   BIOPSY  04/03/2021   Procedure: BIOPSY;  Surgeon: Benancio Deeds, MD;  Location: [facility];  Service: Gastroenterology;;   BIOPSY  12/17/2022   Procedure: BIOPSY;  Surgeon: Hilarie Fredrickson, MD;  Location: [facility];  Service: Gastroenterology;;   BREAST BIOPSY Left    BRONCHIAL WASHINGS  03/28/2021   Procedure: BRONCHIAL WASHINGS;  Surgeon: Steffanie Dunn, DO;  Location: [facility];  Service: Endoscopy;;   CARDIAC CATHETERIZATION N/A 11/19/2016   Procedure: Left Heart Cath and Coronary Angiography;  Surgeon: Lyn Records, MD;  Location: [facility] INVASIVE CV LAB;  Service: Cardiovascular.    LM nl, LAD 40p, 50md, D1/2 small, LCX 40ost, OM2/3 nl/small, RCA 35 ost/mid, RPDA/RPL small/nl.   CARDIAC MRI  03/30/2021   Normal LVEF ~53%. 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 No Scar or Late Gadolinium Enhancement on LV Myocardium.   CARPAL TUNNEL RELEASE Right 11/01/2014   COLONOSCOPY     DILATION AND CURETTAGE OF UTERUS     ECTOPIC PREGNANCY SURGERY  X 2   ENTEROSCOPY N/A 09/05/2022   Procedure: ENTEROSCOPY;  Surgeon: Meridee Score Netty Starring., MD;  Location: [facility];  Service: Gastroenterology;  Laterality: N/A;   ESOPHAGOGASTRODUODENOSCOPY N/A 12/17/2022   Procedure: ESOPHAGOGASTRODUODENOSCOPY (EGD);   Surgeon: Hilarie Fredrickson, MD;  Location: [facility];  Service: Gastroenterology;  Laterality: N/A;   ESOPHAGOGASTRODUODENOSCOPY (EGD) WITH PROPOFOL N/A 04/03/2021   Procedure: ESOPHAGOGASTRODUODENOSCOPY (EGD) WITH PROPOFOL;  Surgeon: Benancio Deeds, MD;  Location: [facility];  Service: Gastroenterology;  Laterality: N/A;   ESOPHAGOGASTRODUODENOSCOPY (EGD) WITH PROPOFOL N/A 08/15/2021   Procedure: ESOPHAGOGASTRODUODENOSCOPY (EGD) WITH PROPOFOL;  Surgeon: Meryl Dare, MD;  Location: [facility];  Service: Endoscopy;  Laterality: N/A;   HOT HEMOSTASIS N/A 09/05/2022   Procedure: HOT HEMOSTASIS (ARGON PLASMA COAGULATION/BICAP);  Surgeon: Lemar Lofty., MD;  Location: [facility];  Service: Gastroenterology;  Laterality: N/A;   JOINT REPLACEMENT     KNEE ARTHROSCOPY Left 11/2014   RIGHT/LEFT HEART CATH AND CORONARY ANGIOGRAPHY N/A 08/08/2021   Procedure: RIGHT/LEFT HEART CATH AND CORONARY ANGIOGRAPHY;  Surgeon: Marykay Lex, MD;  Location: [facility] INVASIVE CV LAB;  Service: Cardiovascular;Ost LCx 40%.  Ost-Prox RCA 35%. Prox-Mid LAD 25%-<25% D2. Mod-Severely Elevated LVEDP. Mild PA HTN -> PA mean 31 mmHg with a PCWP and LVEDP of 23 mmHg   SUBMUCOSAL TATTOO INJECTION  09/05/2022   Procedure: SUBMUCOSAL TATTOO INJECTION;  Surgeon: Lemar Lofty., MD;  Location: [facility];  Service: Gastroenterology;;   TEE WITHOUT CARDIOVERSION N/A 03/28/2021   Procedure: TRANSESOPHAGEAL ECHOCARDIOGRAM (TEE);  Surgeon: Vesta Mixer, MD;  Location: [facility] ENDOSCOPY;; LVEF 55-60%. Normal LV Fxn & no RWMA. No LAA thrombus. Gr II Ao Plaque. Large circumscribed calcific mass (1.8 x 1.4 cm) anterior to posterior annulus.  Fixed posterior leaflet with Mod-Severe MS (Mean MVG ~14 mmHg, Peak 20 mmhg).  Mild MR   TONSILLECTOMY     TOTAL KNEE ARTHROPLASTY Right 02/18/2015   Procedure: RIGHT TOTAL KNEE ARTHROPLASTY;  Surgeon: Beverely Low, MD;  Location: [facility] OR;  Service: Orthopedics;  Laterality:  Right;   TOTAL KNEE ARTHROPLASTY Left 08/19/2015   Procedure: LEFT TOTAL KNEE ARTHROPLASTY;  Surgeon: Beverely Low, MD;  Location: [facility] OR;  Service: Orthopedics;  Laterality: Left;   TRANSTHORACIC ECHOCARDIOGRAM  11/2016; 07/2017:   a) normal LV size, thickness and function. EF 55-60%. GR 1 DD.No RWMA severe mitral annular calcification, but no mitral stenosis. Mild LA dilation;; b) normal LV size and function.  EF 65-75%.  Unable to assess diastolic function.  Aortic sclerosis with no stenosis.  Moderate mitral stenosis? (Gradient 9 mmHg) -?  Mild-mod left atrial enlargement    TRANSTHORACIC ECHOCARDIOGRAM  01/2019   EF 65 to 70%.  Normal function.  Elevated LVEDP-GR 1 DD.  Normal RV size and function.  Large calcific mass on posterior mitral leaflet mild to moderate mitral stenosis.   TRANSTHORACIC ECHOCARDIOGRAM  03/24/2021   EF 60 to 65%.  LV function with normal wall motion.  Moderate basal septal LVH.  GRII DD & Mild LA dilation.-> elevated LVEDP.  Normal RV function.  Mildly elevated PAP.  Ill-defined density measuring 3.23 x 2.1 cm region of the posterior MV leaflet along with dense MAC.  Moderate MS (mean MVG of 9 mmHg.) Normal AoV & RAP. --> Recommend TEE 2/2 concern for MV SBE.   TRANSTHORACIC ECHOCARDIOGRAM  04/26/2022   EF 60 to 65%.  No RWMA.  Moderate LVH with GR 1 DD.  Normal RV size and function.  Mild LA dilation.  Large calcific mass of posterior MV leaflet.  Mean PG 13 mm 3.  Trivial MR.  Mild to moderate MS.  Severe MAC.  Normal AOV.  Normal RAP.   VAGINAL HYSTERECTOMY     VIDEO BRONCHOSCOPY N/A 03/28/2021   Procedure: VIDEO BRONCHOSCOPY WITHOUT FLUORO;  Surgeon: Steffanie Dunn, DO;  Location: [facility] ENDOSCOPY;  Service: Endoscopy;  Laterality: N/A;    Prior to Admission medications   Medication Sig Start Date End Date Taking? Authorizing Provider  acetaminophen (TYLENOL) 500 MG tablet Take 1,000 mg by mouth every 6 (six) hours as needed (for pain).   Yes [provider]   albuterol (PROVENTIL) (2.5 MG/3ML) 0.083% nebulizer solution Take 3 mLs (2.5 mg total) by nebulization every 6 (six) hours as needed for wheezing or shortness of breath. Patient taking differently: Take 2.5 mg by nebulization See admin instructions. Nebulize 2.5 mg and inhale into the lungs at bedtime- may use an additional 2.5 mg up to three times a day as needed for wheezing or shortness of breath 12/20/22  Yes Cobb, Ruby Cola, NP  albuterol (VENTOLIN HFA) 108 (90 Base) MCG/ACT inhaler TAKE 2 PUFFS BY  MOUTH EVERY 6 HOURS AS NEEDED FOR WHEEZE OR SHORTNESS OF BREATH Patient taking differently: Inhale 2 puffs into the lungs every 6 (six) hours as needed for wheezing or shortness of breath. 05/31/23  Yes Kalman Shan, MD  ALPRAZolam Prudy Feeler) 0.25 MG tablet Take 1-2 tablets (0.25-0.5 mg total) by mouth 2 (two) times daily as needed for anxiety. Can also take a dose as needed for insomnia Patient taking differently: Take 0.25-0.5 mg by mouth 2 (two) times daily as needed for anxiety (may also take an additional 0.25-0.5 mg at bedtime as needed for insomnia). 06/24/23  Yes Copland, Gwenlyn Found, MD  apixaban (ELIQUIS) 5 MG TABS tablet Take 1 tablet (5 mg total) by mouth 2 (two) times daily. Resume taking on 12/19/22 12/18/22  Yes Briant Cedar, MD  atorvastatin (LIPITOR) 80 MG tablet Take 1 tablet (80 mg total) by mouth every evening. Patient taking differently: Take 80 mg by mouth daily. 09/19/22  Yes Marykay Lex, MD  cetirizine (ZYRTEC) 10 MG tablet Take 10 mg by mouth daily as needed for allergies.   Yes [provider]  Continuous Glucose Sensor (DEXCOM G7 SENSOR) MISC 1 Device by Does not apply route as directed. 04/22/23  Yes [name], Konrad Dolores, MD  diltiazem (CARDIZEM CD) 360 MG 24 hr capsule Take 1 capsule (360 mg total) by mouth daily. 01/24/23  Yes Marykay Lex, MD  folic acid (FOLVITE) 1 MG tablet Take 2 tablets (2 mg total) by mouth daily. Patient taking differently:  Take 1 mg by mouth daily. 01/21/20  Yes Copland, Gwenlyn Found, MD  furosemide (LASIX) 20 MG tablet DECREASE LASIX ( FUROSEMIDE) TO 20 MG ( 1/2/TABLET) , WITH 20 MG YOU MAY TAKE A AN ADDITIONAL 20 MG IF NEED FOR SWELLING OR WEIGHT GAIN MORE THAN 3 LBS OVERNIGHT OR 5 LBS IN ONE WEEK -- IF YOU HAVE CONTINUE TO TAKE AN ADDITIONAL 20 MG , GO BACK TO 40 MG TAKE ADDITIONAL DOSE AS NEEDED FOR EGT GAIN> 3 LB IN 1 DAY Patient taking differently: Take 20 mg by mouth See admin instructions. Take 20 mg by mouth in the morning and an additional 20 mg for an overnight weight gain of 3 pounds or 5 pounds in a week 03/20/23  Yes Marykay Lex, MD  hydroxychloroquine (PLAQUENIL) 200 MG tablet Take 200 mg by mouth 2 (two) times daily. 05/30/22  Yes [provider]  Insulin Disposable Pump (OMNIPOD 5 G6 PODS, GEN 5,) MISC Inject 1 Device into the skin every 3 (three) days.   Yes [provider]  insulin lispro (HUMALOG) 100 UNIT/ML injection Max Daily 100 units per pump Patient taking differently: 100 Units See admin instructions. Max Daily 100 units per pump- every three days 05/13/23  Yes [name], Konrad Dolores, MD  isosorbide mononitrate (IMDUR) 30 MG 24 hr tablet Take 1 tablet (30 mg total) by mouth daily. 09/19/22  Yes Marykay Lex, MD  leflunomide (ARAVA) 20 MG tablet Take 20 mg by mouth daily. 08/16/21  Yes [provider]  losartan (COZAAR) 100 MG tablet Take 0.5 tablets (50 mg total) by mouth daily. 09/19/22  Yes Marykay Lex, MD  Melatonin 5 MG CHEW Chew 10 mg by mouth at bedtime.   Yes [provider]  Multiple Vitamin (MULTIVITAMIN WITH MINERALS) TABS tablet Take 1 tablet by mouth daily with breakfast.   Yes [provider]  Oxymetazoline HCl (VICKS SINEX NA) Place 1 spray into both nostrils every 12 (twelve) hours as needed (  for congestion- MAX OF THREE CONSECUTIVE DAYS).   Yes [provider]  pantoprazole (PROTONIX) 40 MG tablet Take 1 tablet (40 mg  total) by mouth daily. 12/31/22 12/31/23 Yes Meryl Dare, MD  sodium chloride (OCEAN) 0.65 % SOLN nasal spray Place 1 spray into both nostrils as needed. Patient taking differently: Place 1 spray into both nostrils as needed for congestion. 07/11/23  Yes Ashok Croon, MD  tirzepatide [facility]) 10 MG/0.5ML Pen Inject 10 mg into the skin once a week. Patient taking differently: Inject 10 mg into the skin every Sunday. 04/22/23  Yes [name], Konrad Dolores, MD  traZODone (DESYREL) 50 MG tablet Take 0.5-1 tablets (25-50 mg total) by mouth at bedtime as needed. for sleep Patient taking differently: Take 50 mg by mouth at bedtime. 05/15/23  Yes Copland, Gwenlyn Found, MD  Continuous Blood Gluc Transmit (DEXCOM G6 TRANSMITTER) MISC 1 Device by Does not apply route as directed. 10/19/22   [name], Konrad Dolores, MD  glucose blood (CONTOUR NEXT TEST) test strip 3x daily 07/20/19   [name], Konrad Dolores, MD  Insulin Disposable Pump (OMNIPOD 5 G7 INTRO, GEN 5,) KIT 1 Device by Does not apply route every other day. Patient not taking: Reported on 07/15/2023 05/13/23   [name], Konrad Dolores, MD  Insulin Disposable Pump (OMNIPOD 5 G7 PODS, GEN 5,) MISC 1 Device by Does not apply route every other day. Patient not taking: Reported on 07/15/2023 07/01/23   [name], Konrad Dolores, MD  Insulin Pen Needle (B-D UF III MINI PEN NEEDLES) 31G X 5 MM MISC USE DAILY WITH VICTOZA AND LANTUS. Patient taking differently: Use for ozempic 07/20/19   [name], Konrad Dolores, MD  Insulin Pen Needle 31G X 5 MM MISC 1 Device by Does not apply route 3 (three) times daily. 01/21/20   Copland, Gwenlyn Found, MD  ketoconazole (NIZORAL) 2 % cream Apply 1 Application topically daily. Patient not taking: Reported on 07/15/2023 11/12/22   Louann Sjogren, DPM  Lancets 30G MISC 1 Device by Does not apply route 3 (three) times daily. 07/16/19   Copland, Gwenlyn Found, MD  oxymetazoline (AFRIN NASAL SPRAY) 0.05 % nasal spray Place 2  sprays into both nostrils 2 (two) times daily as needed for congestion. Do not use for longer than 2 days Patient not taking: Reported on 07/15/2023 07/11/23   Ashok Croon, MD  SAFETY-LOK TB SYRINGE 27GX.5" 27G X 1/2" 1 ML MISC  08/18/19   [provider]    Current Facility-Administered Medications  Medication Dose Route Frequency Provider Last Rate Last Admin   0.9 % NaCl with KCl 20 mEq/ L  infusion   Intravenous Continuous Mansy, Jan A, MD   Stopped at 07/16/23 0209   acetaminophen (TYLENOL) tablet 650 mg  650 mg Oral Q6H PRN Mansy, Jan A, MD       Or   acetaminophen (TYLENOL) suppository 650 mg  650 mg Rectal Q6H PRN Mansy, Jan A, MD       albuterol (PROVENTIL) (2.5 MG/3ML) 0.083% nebulizer solution 2.5 mg  2.5 mg Nebulization QHS Mansy, Jan A, MD       albuterol (PROVENTIL) (2.5 MG/3ML) 0.083% nebulizer solution 2.5 mg  2.5 mg Nebulization Q8H PRN Mansy, Jan A, MD       ALPRAZolam Prudy Feeler) tablet 0.25-0.5 mg  0.25-0.5 mg Oral BID PRN Mansy, Jan A, MD       atorvastatin (LIPITOR) tablet 80 mg  80 mg Oral Daily Mansy, Vernetta Honey, MD       diltiazem (  CARDIZEM CD) 24 hr capsule 360 mg  360 mg Oral Daily Mansy, Jan A, MD       folic acid (FOLVITE) tablet 1 mg  1 mg Oral Daily Mansy, Jan A, MD       hydroxychloroquine (PLAQUENIL) tablet 200 mg  200 mg Oral BID Mansy, Jan A, MD   200 mg at 07/16/23 0002   [START ON 07/17/2023] influenza vaccine adjuvanted (FLUAD) injection 0.5 mL  0.5 mL Intramuscular Tomorrow-1000 Mansy, Jan A, MD       insulin aspart (novoLOG) injection 0-5 Units  0-5 Units Subcutaneous QHS Mansy, Jan A, MD       insulin aspart (novoLOG) injection 0-9 Units  0-9 Units Subcutaneous TID WC Mansy, Jan A, MD       isosorbide mononitrate (IMDUR) 24 hr tablet 30 mg  30 mg Oral Daily Mansy, Jan A, MD       leflunomide (ARAVA) tablet 20 mg  20 mg Oral Daily Mansy, Jan A, MD       loratadine (CLARITIN) tablet 10 mg  10 mg Oral Daily PRN Mansy, Jan A, MD       losartan (COZAAR)  tablet 50 mg  50 mg Oral Daily Mansy, Jan A, MD       melatonin tablet 10 mg  10 mg Oral QHS Mansy, Jan A, MD   10 mg at 07/15/23 2339   ondansetron [facility]) tablet 4 mg  4 mg Oral Q6H PRN Mansy, Jan A, MD       Or   ondansetron [facility]) injection 4 mg  4 mg Intravenous Q6H PRN Mansy, Jan A, MD       pantoprazole (PROTONIX) injection 40 mg  40 mg Intravenous Q12H Mansy, Jan A, MD   40 mg at 07/15/23 2339   sodium chloride (OCEAN) 0.65 % nasal spray 1 spray  1 spray Each Nare PRN Mansy, Jan A, MD       traZODone (DESYREL) tablet 25 mg  25 mg Oral QHS PRN Mansy, Jan A, MD        Allergies as of 07/15/2023 - Review Complete 07/15/2023  Allergen Reaction Noted   Methotrexate Other (See Comments) 01/23/2022   Penicillins Hives 07/22/2012   Farxiga [dapagliflozin] Other (See Comments) 05/01/2019   Metformin and related Diarrhea and Other (See Comments) 06/12/2017    Family History  Problem Relation Age of Onset   Cancer Mother    Diabetes Mother    Hypertension Mother    Hyperlipidemia Mother    Cancer Father    Hyperlipidemia Father    Mental illness Sister    Diabetes Sister    Diabetes Maternal Grandmother    Diabetes Maternal Grandfather    Colon cancer Neg Hx    Esophageal cancer Neg Hx    Stomach cancer Neg Hx    Rectal cancer Neg Hx    Tremor Neg Hx    Parkinson's disease Neg Hx     Social History   Socioeconomic History   Marital status: Married    Spouse name: Not on file   Number of children: 1   Years of education: Not on file   Highest education level: Associate degree: occupational, Scientist, product/process development, or vocational program  Occupational History   Occupation: retired  Tobacco Use   Smoking status: Former    Current packs/day: 0.00    Average packs/day: 0.3 packs/day for 44.0 years (11.0 ttl pk-yrs)    Types: Cigarettes    Start date: 02/17/1971  Quit date: 02/17/2015    Years since quitting: 8.4   Smokeless tobacco: Never  Vaping Use   Vaping status: Never Used   Substance and Sexual Activity   Alcohol use: Not Currently    Comment: 07/03/2017 "might have a few drinks/year"   Drug use: No   Sexual activity: Never  Other Topics Concern   Not on file  Social History Narrative   Lives at home with husband   Right handed   Caffeine: 1 cup coffee in AM    Social Determinants of Health   Financial Resource Strain: Low Risk  (06/19/2023)   Overall Financial Resource Strain (CARDIA)    Difficulty of Paying Living Expenses: Not very hard  Food Insecurity: No Food Insecurity (07/15/2023)   Hunger Vital Sign    Worried About Running Out of Food in the Last Year: Never true    Ran Out of Food in the Last Year: Never true  Recent Concern: Food Insecurity - Food Insecurity Present (06/19/2023)   Hunger Vital Sign    Worried About Running Out of Food in the Last Year: Sometimes true    Ran Out of Food in the Last Year: Sometimes true  Transportation Needs: No Transportation Needs (07/15/2023)   PRAPARE - Administrator, Civil Service (Medical): No    Lack of Transportation (Non-Medical): No  Physical Activity: Insufficiently Active (06/19/2023)   Exercise Vital Sign    Days of Exercise per Week: 1 day    Minutes of Exercise per Session: 10 min  Stress: Stress Concern Present (06/19/2023)   [employer] of Occupational Health - Occupational Stress Questionnaire    Feeling of Stress : Very much  Social Connections: Moderately Isolated (06/19/2023)   Social Connection and Isolation Panel [NHANES]    Frequency of Communication with Friends and Family: Twice a week    Frequency of Social Gatherings with Friends and Family: Once a week    Attends Religious Services: Never    Database administrator or Organizations: No    Attends Banker Meetings: Never    Marital Status: Married  Catering manager Violence: Not At Risk (07/15/2023)   Humiliation, Afraid, Rape, and Kick questionnaire    Fear of Current or Ex-Partner: No    Emotionally  Abused: No    Physically Abused: No    Sexually Abused: No    Review of Systems: Pertinent positive and negative review of systems were noted in the above HPI section.  All other review of systems was otherwise negative.   Physical Exam: Vital signs in last 24 hours: Temp:  [97.8 F (36.6 C)-98.9 F (37.2 C)] 97.8 F (36.6 C) (10/01 0509) Pulse Rate:  [73-83] 79 (10/01 0509) Resp:  [16-20] 19 (10/01 0509) BP: (116-142)/(53-83) 116/53 (10/01 0509) SpO2:  [93 %-100 %] 98 % (10/01 0509) Weight:  [107 kg-107.6 kg] 107 kg (09/30 2058) Last BM Date : 07/15/23 General:   Alert,  Well-developed, well-nourished, older African-American female pleasant and cooperative in NAD, up in chair Head:  Normocephalic and atraumatic. Eyes:  Sclera clear, no icterus.   Conjunctiva pale Ears:  Normal auditory acuity. Nose:  No deformity, discharge,  or lesions. Mouth:  No deformity or lesions.   Neck:  Supple; no masses or thyromegaly. Lungs:  Clear throughout to auscultation.   No wheezes, crackles, or rhonchi.  Heart:  Regular rate and rhythm; soft systolic murmur Abdomen:  Soft,, obese, nontender nondistended, BS active,nonpalp mass or hsm.   Rectal:  Not done, documented heme positive on admit Msk:  Symmetrical without gross deformities. . Pulses:  Normal pulses noted. Extremities:  Without clubbing or edema. Neurologic:  Alert and  oriented x4;  grossly normal neurologically. Skin:  Intact without significant lesions or rashes.. Psych:  Alert and cooperative. Normal mood and affect.  Intake/Output from previous day: 09/30 0701 - 10/01 0700 In: 533.2 [I.V.:224.6; Blood:308.6] Out: -  Intake/Output this shift: No intake/output data recorded.  Lab Results: Recent Labs    07/15/23 1743 07/16/23 0141 07/16/23 0558  WBC 9.2  --   --   HGB 7.1* 7.6* 7.0*  HCT 24.0* 26.1* 24.1*  PLT 243  --   --    BMET Recent Labs    07/15/23 1743 07/16/23 0558  NA 140 136  K 3.5 3.1*  CL 100 100   CO2 29 27  GLUCOSE 113* 135*  BUN 13 13  CREATININE 1.11* 1.01*  CALCIUM 8.9 8.5*   LFT Recent Labs    07/15/23 1743  PROT 7.3  ALBUMIN 3.7  AST 29  ALT 19  ALKPHOS 66  BILITOT 0.8   PT/INR No results for input(s): "LABPROT", "INR" in the last 72 hours. Hepatitis Panel No results for input(s): "HEPBSAG", "HCVAB", "HEPAIGM", "HEPBIGM" in the last 72 hours.    IMPRESSION:  #17 67 year old African-American female with history of recurrent anemia and GI bleeding in setting of chronic Eliquis for atrial fibrillation. Patient presents with 1 week history of melena and complaints of fatigue. Hemoglobin on 07/12/2023 was 8, repeat yesterday down to 7.1 and admitted.  She has been transfused 1 unit of packed RBCs, and hemoglobin is 7 this morning. She has been hemodynamically stable, no bowel movement since admission Last dose of Eliquis yesterday morning  She has history of a clean-based gastric ulcer on EGD March 2024, and previous history of both gastric and small bowel AVMs on enteroscopy November 2023.  Suspect bleeding secondary to AVMs.  #2 anemia acute on chronic secondary to GI blood loss #3 thalassemia #4.  Atrial fibrillation-on chronic anticoagulation with Eliquis #5.  Mitral stenosis #6.  Diabetes mellitus #7.  Interstitial lung disease/COPD on chronic oxygen 3 L #8.  Hypertension #9.  Rheumatoid arthritis  Plan; full liquid diet today, n.p.o. after midnight Will schedule patient for enteroscopy with Dr. Marina Goodell for tomorrow afternoon, if no significant findings at enteroscopy then can deploy capsule capsule endoscopy postprocedure. Would transfuse 1 more unit of packed RBCs today, continue to trend hemoglobin Hold Eliquis  GI will follow with you   Amy EsterwoodPA-C  07/16/2023, 8:48 AM  GI ATTENDING  History, laboratories, x-rays, multiple prior endoscopy reports reviewed.  Patient seen and examined.  Agree with comprehensive consultation note as outlined  above.  Patient known to me.  Presents with acute on chronic GI bleeding in the face of chronic Eliquis therapy.  Noted to have gastric ulcer as well as AVMs.  Bleeding consistent with upper GI.  Plan for EGD with enteroscopy tomorrow after Eliquis washout.  May need to "drop" video capsule if no significant pathology identified (to rule out more distal small bowel pathology).  Patient is high risk for procedure given her age and comorbidities.The nature of the procedure, as well as the risks, benefits, and alternatives were carefully and thoroughly reviewed with the patient. Ample time for discussion and questions allowed. The patient understood, was satisfied, and agreed to proceed.  Wilhemina Bonito. Eda Keys., M.D. [facility] Division of Gastroenterology

## 2023-07-16 NOTE — Telephone Encounter (Signed)
Called and spoke with husband  and advised we cannot do a virtual appt while she is in hospital.  He is advised to call once she is discharged and then her medication can be addressed. He wants her to be seen so they can adjust her medications.  Advised we cannot while she is admitted.  Once discharged, if Dr Herbie Baltimore does not have an ppt we can have her see a APP until she can see Dr Herbie Baltimore

## 2023-07-16 NOTE — Progress Notes (Signed)
Mobility Specialist - Progress Note   07/16/23 1052  Mobility  Activity Ambulated with assistance in hallway  Level of Assistance Standby assist, set-up cues, supervision of patient - no hands on  Assistive Device Front wheel walker  Distance Ambulated (ft) 190 ft  Range of Motion/Exercises Active  Activity Response Tolerated well  Mobility Referral Yes  $Mobility charge 1 Mobility  Mobility Specialist Start Time (ACUTE ONLY) 1040  Mobility Specialist Stop Time (ACUTE ONLY) 1050  Mobility Specialist Time Calculation (min) (ACUTE ONLY) 10 min   Pt received in chair and agreed to mobility. Had no issues throughout session and returned to chair with all needs met.  Marilynne Halsted Mobility Specialist

## 2023-07-16 NOTE — Hospital Course (Signed)
14XW with h/o recurrent GI bleeding from gastric ulcer/AVMs, HTN, HLD, chronic diastolic CHF, COPD, CAD, CVA, OSA, DM, and morbid obesity who presented on 9/30 with tarry stools, Hgb 7.1.  She was started on IV Protonix and GI was consulted.

## 2023-07-16 NOTE — Progress Notes (Signed)
Progress Note   Patient: Lynn [name] ZOX:[id number] DOB: [birth year] DOA: 07/15/2023     1 DOS: the patient was seen and examined on 07/16/2023   Brief hospital course: 67yo with h/o recurrent GI bleeding from gastric ulcer/AVMs, HTN, HLD, chronic diastolic CHF, COPD, CAD, CVA, OSA, DM, and morbid obesity who presented on 9/30 with tarry stools, Hgb 7.1.  She was started on IV Protonix and GI was consulted.  Assessment and Plan:  Subacute upper GI bleeding -Patient with h/o gastric ulcer and AVMs presenting with melena, suggestive of upper GI bleeding. -The patient is not tachycardic with normal blood pressure, suggesting subacute volume loss.  -Admitted to telemetry -> med surg -GI consulted, planning for capsule endoscopy and push enteroscopy on 10/2 -NS at 100 mL/hr -Start IV pantoprazole 40 BID -Zofran IV for nausea -Avoid NSAIDs and SQ heparin -Maintain IV access (2 large bore IVs if possible).  ABLA -Patient's lightheadedness and fatigue are most likely caused by anemia secondary to upper GI bleeding.  -Her Hgb decreased on 9.2 on 7/31 to 8 on 9/27 and 7.1 on presentation -She was transfused 1 unit with increase in Hgb yo 7.6 but it is back to 7.1 -One additional unit of blood was ordered   -Monitor closely and follow cbc q12h, transfuse as necessary for Hbg <7   Essential hypertension/CAD -Continue diltiazem, Imdur, losartan   Dyslipidemia -Continue atorvastatin   Rheumatoid arthritis (HCC) -Continue Plaquenil, Arva   Type 2 diabetes mellitus without complications (HCC) -She wears an Omnipod but reports no need for glargine since being on Mounjaro -Pior A1c was 6.9 in July, good coverage -Will cover with sensitive-scale SSI for now   Anxiety -Continue alprazolam  COPD -Wears 3-4L home O2, on home O2 at this time -Continue Albuterol  Afib -Hold Eliquis in the setting of GI bleed -Rate controlled with diltiazem  Morbid obesity -Has been on Mounjaro  with improvement -Body mass index is 41.79 kg/m..  -Ongoing weight loss should be encouraged -Outpatient PCP/bariatric medicine/bariatric surgery f/u encouraged     Consultants: GI  Procedures: ?EGD  Antibiotics: None  30 Day Unplanned Readmission Risk Score    Flowsheet Row ED to Hosp-Admission (Current) from 07/15/2023 in [facility]  30 Day Unplanned Readmission Risk Score (%) 15.97 Filed at 07/16/2023 0401       This score is the patient's risk of an unplanned readmission within 30 days of being discharged (0 -100%). The score is based on dignosis, age, lab data, medications, orders, and past utilization.   Low:  0-14.9   Medium: 15-21.9   High: 22-29.9   Extreme: 30 and above           Subjective: She is feeling somewhat fatigued today.  No abdominal pain.  Eager to have capsule endoscopy to hopefully determine the bleeding source.  Hoping to have real food today.   Objective: Vitals:   07/16/23 0509 07/16/23 1151  BP: (!) 116/53 136/78  Pulse: 79 75  Resp: 19 18  Temp: 97.8 F (36.6 C) 98.5 F (36.9 C)  SpO2: 98% 100%    Intake/Output Summary (Last 24 hours) at 07/16/2023 1256 Last data filed at 07/16/2023 0345 Gross per 24 hour  Intake 533.22 ml  Output --  Net 533.22 ml   Filed Weights   07/15/23 1716 07/15/23 2058  Weight: 107.5 kg 107 kg    Exam:  General:  Appears calm and comfortable and is in NAD Eyes:  PERRL,  EOMI, normal lids, iris ENT:  grossly normal hearing, lips & tongue, mmm Neck:  no LAD, masses or thyromegaly Cardiovascular:  RRR, no m/r/g. No LE edema.  Respiratory:   CTA bilaterally with no wheezes/rales/rhonchi.  Normal respiratory effort. Abdomen:  soft, NT, ND Skin:  no rash or induration seen on limited exam Musculoskeletal:  grossly normal tone BUE/BLE, good ROM, no bony abnormality Psychiatric:  grossly normal mood and affect, speech fluent and appropriate, AOx3 Neurologic:  CN  2-12 grossly intact, moves all extremities in coordinated fashion  Data Reviewed: I have reviewed the patient's lab results since admission.  Pertinent labs for today include:   BUN 13/Creatinine 1.11/GFR 54 Hgb 7.1, 7.6, 7.0  Heme postiive   Family Communication: None present  Disposition: Status is: Inpatient Remains inpatient appropriate because: ongoing evaluation for GI bleeding, transfusing     Time spent: 50 minutes  Unresulted Labs (From admission, onward)     Start     Ordered   07/17/23 0500  CBC with Differential/Platelet  Tomorrow morning,   R        07/16/23 1238   07/17/23 0500  Basic metabolic panel  Tomorrow morning,   R        07/16/23 1238   07/16/23 1238  Prepare RBC (crossmatch)  (Blood Administration Adult)  Once,   R       Question Answer Comment  # of Units 1 unit   Transfusion Indications Hemoglobin < 7 gm/dL and symptomatic   Number of Units to Keep Ahead NO units ahead   If emergent release call blood bank Not emergent release      07/16/23 1238   07/16/23 1014  Hemoglobin and hematocrit, blood  Now then every 8 hours,   R,   Status:  Canceled     Comments: Does not need the now hemoglobin, just start 8 hours after last hemoglobin and call MD for hemoglobin 7 or less for transfusion orders    07/16/23 1013             Author: Jonah Blue, MD 07/16/2023 12:56 PM  For on call review [email or web address].

## 2023-07-17 ENCOUNTER — Encounter (HOSPITAL_COMMUNITY): Payer: Self-pay | Admitting: Family Medicine

## 2023-07-17 ENCOUNTER — Encounter (HOSPITAL_COMMUNITY)
Admission: EM | Disposition: A | Discharge status: Home / Self Care | Payer: Self-pay | Source: Home / Self Care | Attending: Internal Medicine

## 2023-07-17 ENCOUNTER — Inpatient Hospital Stay (HOSPITAL_COMMUNITY): Payer: Medicare Other | Admitting: Certified Registered Nurse Anesthetist

## 2023-07-17 ENCOUNTER — Ambulatory Visit: Payer: Medicare Other | Admitting: Cardiology

## 2023-07-17 DIAGNOSIS — I2510 Atherosclerotic heart disease of native coronary artery without angina pectoris: Secondary | ICD-10-CM

## 2023-07-17 DIAGNOSIS — R195 Other fecal abnormalities: Secondary | ICD-10-CM | POA: Diagnosis not present

## 2023-07-17 DIAGNOSIS — D649 Anemia, unspecified: Secondary | ICD-10-CM

## 2023-07-17 DIAGNOSIS — K921 Melena: Secondary | ICD-10-CM | POA: Diagnosis not present

## 2023-07-17 DIAGNOSIS — Z87891 Personal history of nicotine dependence: Secondary | ICD-10-CM

## 2023-07-17 HISTORY — PX: ENTEROSCOPY: SHX5533

## 2023-07-17 HISTORY — PX: GIVENS CAPSULE STUDY: SHX5432

## 2023-07-17 HISTORY — PX: SUBMUCOSAL TATTOO INJECTION: SHX6856

## 2023-07-17 LAB — GLUCOSE, CAPILLARY
Glucose-Capillary: 142 mg/dL — ABNORMAL HIGH (ref 70–99)
Glucose-Capillary: 143 mg/dL — ABNORMAL HIGH (ref 70–99)
Glucose-Capillary: 175 mg/dL — ABNORMAL HIGH (ref 70–99)
Glucose-Capillary: 143 mg/dL — ABNORMAL HIGH (ref 70–99)

## 2023-07-17 LAB — CBC WITH DIFFERENTIAL/PLATELET
Abs Immature Granulocytes: 0.02 10*3/uL (ref 0.00–0.07)
Basophils Absolute: 0 10*3/uL (ref 0.0–0.1)
Basophils Relative: 1 %
Eosinophils Absolute: 0.2 10*3/uL (ref 0.0–0.5)
Eosinophils Relative: 3 %
HCT: 25.8 % — ABNORMAL LOW (ref 36.0–46.0)
Hemoglobin: 7.6 g/dL — ABNORMAL LOW (ref 12.0–15.0)
Immature Granulocytes: 0 %
Lymphocytes Relative: 16 %
Lymphs Abs: 1.2 10*3/uL (ref 0.7–4.0)
MCH: 20.3 pg — ABNORMAL LOW (ref 26.0–34.0)
MCHC: 29.5 g/dL — ABNORMAL LOW (ref 30.0–36.0)
MCV: 68.8 fL — ABNORMAL LOW (ref 80.0–100.0)
Monocytes Absolute: 0.8 10*3/uL (ref 0.1–1.0)
Monocytes Relative: 10 %
Neutro Abs: 5.3 10*3/uL (ref 1.7–7.7)
Neutrophils Relative %: 70 %
Platelets: 201 10*3/uL (ref 150–400)
RBC: 3.75 MIL/uL — ABNORMAL LOW (ref 3.87–5.11)
RDW: 22.1 % — ABNORMAL HIGH (ref 11.5–15.5)
WBC: 7.6 10*3/uL (ref 4.0–10.5)
nRBC: 0 % (ref 0.0–0.2)

## 2023-07-17 LAB — BASIC METABOLIC PANEL
Anion gap: 10 (ref 5–15)
BUN: 9 mg/dL (ref 8–23)
CO2: 25 mmol/L (ref 22–32)
Calcium: 8.6 mg/dL — ABNORMAL LOW (ref 8.9–10.3)
Chloride: 101 mmol/L (ref 98–111)
Creatinine, Ser: 0.79 mg/dL (ref 0.44–1.00)
GFR, Estimated: >60 mL/min (ref >60)
Glucose, Bld: 133 mg/dL — ABNORMAL HIGH (ref 70–99)
Potassium: 3.4 mmol/L — ABNORMAL LOW (ref 3.5–5.1)
Sodium: 136 mmol/L (ref 135–145)

## 2023-07-17 SURGERY — IMAGING PROCEDURE, GI TRACT, INTRALUMINAL, VIA CAPSULE
Anesthesia: LOCAL

## 2023-07-17 SURGERY — ENTEROSCOPY
Anesthesia: Monitor Anesthesia Care

## 2023-07-17 MED ORDER — LACTATED RINGERS IV SOLN: INTRAVENOUS | Status: DC | PRN

## 2023-07-17 MED ORDER — PHENYLEPHRINE 80 MCG/ML (10ML) SYRINGE FOR IV PUSH (FOR BLOOD PRESSURE SUPPORT)
PREFILLED_SYRINGE | INTRAVENOUS | Status: DC | PRN
  Administered 2023-07-17 (×3): 80 ug via INTRAVENOUS

## 2023-07-17 MED ORDER — IPRATROPIUM-ALBUTEROL 0.5-2.5 (3) MG/3ML IN SOLN
3.0000 mL | Freq: Once | RESPIRATORY_TRACT | Status: AC
  Administered 2023-07-17: 3 mL via RESPIRATORY_TRACT

## 2023-07-17 MED ORDER — IPRATROPIUM-ALBUTEROL 0.5-2.5 (3) MG/3ML IN SOLN
RESPIRATORY_TRACT | Status: AC
  Filled 2023-07-17: qty 3

## 2023-07-17 MED ORDER — LIDOCAINE 2% (20 MG/ML) 5 ML SYRINGE
INTRAMUSCULAR | Status: DC | PRN
  Administered 2023-07-17: 100 mg via INTRAVENOUS

## 2023-07-17 MED ORDER — SUCCINYLCHOLINE CHLORIDE 200 MG/10ML IV SOSY
PREFILLED_SYRINGE | INTRAVENOUS | Status: DC | PRN
Start: 2023-07-17 — End: 2023-07-17
  Administered 2023-07-17: 100 mg via INTRAVENOUS

## 2023-07-17 MED ORDER — LACTATED RINGERS IV SOLN
INTRAVENOUS | Status: DC
  Administered 2023-07-17: 1000 mL via INTRAVENOUS

## 2023-07-17 MED ORDER — ALBUTEROL SULFATE HFA 108 (90 BASE) MCG/ACT IN AERS
INHALATION_SPRAY | RESPIRATORY_TRACT | Status: DC | PRN
Start: 2023-07-17 — End: 2023-07-17
  Administered 2023-07-17: 5 via RESPIRATORY_TRACT

## 2023-07-17 MED ORDER — PROPOFOL 10 MG/ML IV BOLUS
INTRAVENOUS | Status: DC | PRN
  Administered 2023-07-17: 30 mg via INTRAVENOUS
  Administered 2023-07-17: 50 mg via INTRAVENOUS

## 2023-07-17 MED ORDER — PROPOFOL 500 MG/50ML IV EMUL
INTRAVENOUS | Status: DC | PRN
  Administered 2023-07-17: 150 ug/kg/min via INTRAVENOUS

## 2023-07-17 MED ORDER — PROPOFOL 500 MG/50ML IV EMUL
INTRAVENOUS | Status: AC
  Filled 2023-07-17: qty 50

## 2023-07-17 NOTE — Progress Notes (Signed)
Givens capsule endoscopy ordered by MD PERRY.  Patient ingested capsule at 1445.  Per Given's capsule instructions, patient to remain NPO until 1645 at which time they may progress to clear liquid diet. At 1845 patient may have a small snack such as a half a sandwich or a bowl of soup. At 2245 patient may progress to previously ordered diet.  The capsule endoscopy study will conclude at 0245 on 07-18-23 at which time the recorder and leads or belt can be removed and placed in a patient belongings bag. Endoscopy staff will pick up the equipment in the AM.

## 2023-07-17 NOTE — Progress Notes (Signed)
Progress Note   Patient: Lynn [name] WUJ:[id number] DOB: 11/06/1955 DOA: 07/15/2023     2 DOS: the patient was seen and examined on 07/17/2023   Brief hospital course: 67yo with h/o recurrent GI bleeding from gastric ulcer/AVMs, HTN, HLD, chronic diastolic CHF, COPD, CAD, CVA, OSA, DM, and morbid obesity who presented on 9/30 with tarry stools, Hgb 7.1.  She was started on IV Protonix and GI was consulted.  Assessment and Plan: Subacute upper GI bleeding -Patient with h/o gastric ulcer and AVMs presenting with melena, suggestive of upper GI bleeding. -GI consulted. Pt now s/p EGD 10/2 which was found to be normal. Capsule was placed. Currently pending -cont protonix 40 BID -Zofran IV for nausea -Avoid NSAIDs and SQ heparin   ABLA -Patient's lightheadedness and fatigue are most likely caused by anemia secondary to upper GI bleeding.  -s/p 2 units PRBC's thus far -Follow CBC trends   Essential hypertension/CAD -Continue diltiazem, Imdur, losartan   Dyslipidemia -Continue atorvastatin   Rheumatoid arthritis (HCC) -Continue Plaquenil, Arva   Type 2 diabetes mellitus without complications (HCC) -She wears an Omnipod but reports no need for glargine since being on Mounjaro -Pior A1c was 6.9 in July, good coverage -Contiue sensitive-scale SSI for now   Anxiety -Continue alprazolam   COPD -Wears 3-4L home O2, on home O2 at this time -Continue Albuterol   Afib -Hold Eliquis in the setting of GI bleed -Rate controlled with diltiazem   Morbid obesity -Has been on Mounjaro with improvement -Body mass index is 41.79 kg/m..  -Ongoing weight loss should be encouraged -Outpatient PCP/bariatric medicine/bariatric surgery f/u encouraged    Subjective: Without complaints this AM  Physical Exam: Vitals:   07/17/23 1520 07/17/23 1530 07/17/23 1540 07/17/23 1550  BP: (!) 147/65 (!) 128/56 (!) 144/64 (!) 144/67  Pulse: 79 81 78 79  Resp: (!) 24 15 (!) 22 20  Temp:       TempSrc:      SpO2: 97% 100% 97% 94%  Weight:      Height:       General exam: Awake, laying in bed, in nad Respiratory system: Normal respiratory effort, no wheezing Cardiovascular system: regular rate, s1, s2 Gastrointestinal system: Soft, nondistended, positive BS Central nervous system: CN2-12 grossly intact, strength intact Extremities: Perfused, no clubbing Skin: Normal skin turgor, no notable skin lesions seen Psychiatry: Mood normal // no visual hallucinations   Data Reviewed:  Labs reviewed: Na 136, K 3.4, Cr 0.79, WBC 7.6, Hgb 7.6, Plts 201  Family Communication: Pt in room, family not at bedside  Disposition: Status is: Inpatient Remains inpatient appropriate because: severity of illness  Planned Discharge Destination: Home     Author: Rickey Barbara, MD 07/17/2023 5:21 PM  For on call review [email or web address].

## 2023-07-17 NOTE — Plan of Care (Signed)

## 2023-07-17 NOTE — Op Note (Signed)
[facility] Patient Name: Lynn [name] Procedure Date: 07/17/2023 MRN: [id number] Attending MD: Wilhemina Bonito. Marina Goodell , MD, [id number] Date of Birth: 12-Jan-1956 CSN: [id number] Age: 67 Admit Type: Inpatient Procedure:                Small bowel enteroscopy with submucosal injection                            and VCE deployment Indications:              Melena Providers:                Wilhemina Bonito. Marina Goodell, MD, Stephens Shire RN, RN, Suzy Bouchard, RN, Kandice Robinsons, Technician Referring MD:             triad hospitalists Medicines:                See the Anesthesia note for documentation of the                            administered medications Complications:            No immediate complications. THE PATIENT HAD                            TRANSIENT HYPOXIA. ANESTHESIA DECIDED TO CONVERT                            FROM MAC TO GA. EXAM COMPLETED THEREAFTER WITHOUT                            INCIDENT. Estimated Blood Loss:     Estimated blood loss: none. Procedure:                Pre-Anesthesia Assessment:                           - Prior to the procedure, a History and Physical                            was performed, and patient medications and                            allergies were reviewed. The patient's tolerance of                            previous anesthesia was also reviewed. The risks                            and benefits of the procedure and the sedation                            options and risks were discussed with the patient.  All questions were answered, and informed consent                            was obtained. Prior Anticoagulants: The patient has                            taken Eliquis (apixaban), last dose was 3 days                            prior to procedure. ASA Grade Assessment: III - A                            patient with severe systemic disease. After                            reviewing  the risks and benefits, the patient was                            deemed in satisfactory condition to undergo the                            procedure.                           After obtaining informed consent, the endoscope was                            passed under direct vision. Throughout the                            procedure, the patient's blood pressure, pulse, and                            oxygen saturations were monitored continuously. The                            PCF-H190TL ([id number]) Olympus slim colonoscope was                            introduced through the mouth and advanced to the                            proximal jejunum. The GIF-H190 ([id number]) Olympus                            endoscope was introduced through the and advanced                            to the. The small bowel enteroscopy was                            accomplished without difficulty. The patient  tolerated the procedure well. Scope In: Scope Out: Findings:      The esophagus was normal.      The stomach was normal.      There was no evidence of significant pathology in the entire examined       duodenum. The proximal jejunem was normal. Passed beyong prior tattoo.       New distal extent tattoo placed .      Scope withdrawn. Video Capsule mounted on standard EGD scope and placed       in second duodenum Impression:               - Normal esophagus.                           - Normal stomach.                           - Normal examined jejunum and duodenum. Tattoo                            placed at distal extent of exam.                           - No specimens collected. Recommendation:           - Clear liquid diet.                           - Await capsule results Procedure Code(s):        --- Professional ---                           [phone], Small intestinal endoscopy, enteroscopy                            beyond second portion of duodenum, not including                             ileum; diagnostic, including collection of                            specimen(s) by brushing or washing, when performed                            (separate procedure) Diagnosis Code(s):        --- Professional ---                           K92.1, Melena (includes Hematochezia) CPT copyright 2022 American Medical Association. All rights reserved. The codes documented in this report are preliminary and upon coder review may  be revised to meet current compliance requirements. Wilhemina Bonito. Marina Goodell, MD 07/17/2023 3:16:46 PM This report has been signed electronically. Number of Addenda: 0

## 2023-07-17 NOTE — Transfer of Care (Signed)
Immediate Anesthesia Transfer of Care Note  Patient: Lynn [name]  Procedure(s) Performed: Procedure(s): ENTEROSCOPY (N/A) SUBMUCOSAL TATTOO INJECTION GIVENS CAPSULE STUDY (N/A)  Patient Location: PACU  Anesthesia Type:General  Level of Consciousness: Patient easily awoken, comfortable, cooperative, following commands, responds to stimulation.   Airway & Oxygen Therapy: Patient spontaneously breathing, ventilating well, oxygen via simple oxygen mask.  Post-op Assessment: Report given to PACU RN, vital signs reviewed and stable, moving all extremities.   Post vital signs: Reviewed and stable.  Complications: No apparent anesthesia complications  Last Vitals:  Vitals Value Taken Time  BP 140/71 07/17/23 1511  Temp    Pulse 79 07/17/23 1515  Resp 22 07/17/23 1515  SpO2 99 % 07/17/23 1515  Vitals shown include unfiled device data.  Last Pain:  Vitals:   07/17/23 1254  TempSrc: Oral  PainSc: 0-No pain      Patients Stated Pain Goal: 0 (07/16/23 2335)  Complications: No notable events documented.

## 2023-07-17 NOTE — Plan of Care (Signed)
  Problem: Education: Goal: Knowledge of General Education information will improve Description: Including pain rating scale, medication(s)/side effects and non-pharmacologic comfort measures Outcome: Progressing   Problem: Clinical Measurements: Goal: Respiratory complications will improve Outcome: Progressing   Problem: Elimination: Goal: Will not experience complications related to bowel motility Outcome: Progressing   Problem: Skin Integrity: Goal: Risk for impaired skin integrity will decrease Outcome: Progressing

## 2023-07-17 NOTE — Progress Notes (Addendum)
Patient ID: Lynn [name], female   DOB: Jan 10, 1956, 67 y.o.   MRN: [id number]    Progress Note   Subjective   Day # 3 CC;melena, anemia in setting of Eliquis  Eliquis on hold  HGb 7.6 post 2  units total   No complaints of abdominal discomfort, no further melena or bowel movements     Objective   Vital signs in last 24 hours: Temp:  [98.2 F (36.8 C)-98.6 F (37 C)] 98.3 F (36.8 C) (10/02 0518) Pulse Rate:  [72-80] 76 (10/02 0518) Resp:  [16-18] 18 (10/02 0518) BP: (125-150)/(48-78) 125/48 (10/02 0518) SpO2:  [97 %-100 %] 99 % (10/02 0823) Last BM Date : 07/15/23 General:   Older AA female in NAD Heart:  Regular rate and rhythm; no murmurs Lungs: Respirations even and unlabored, on nasal O2 Abdomen:  Soft, obese ,nontender and nondistended. Normal bowel sounds. Extremities:  Without edema. Neurologic:  Alert and oriented,  grossly normal neurologically. Psych:  Cooperative. Normal mood and affect.  Intake/Output from previous day: 10/01 0701 - 10/02 0700 In: 1625.4 [I.V.:1291.4; Blood:334] Out: -  Intake/Output this shift: No intake/output data recorded.  Lab Results: Recent Labs    07/15/23 1743 07/16/23 0141 07/16/23 0558 07/17/23 0512  WBC 9.2  --  8.1 7.6  HGB 7.1* 7.6* 7.1*  7.0* 7.6*  HCT 24.0* 26.1* 24.3*  24.1* 25.8*  PLT 243  --  210 201   BMET Recent Labs    07/15/23 1743 07/16/23 0558 07/17/23 0512  NA 140 136 136  K 3.5 3.1* 3.4*  CL 100 100 101  CO2 29 27 25   GLUCOSE 113* 135* 133*  BUN 13 13 9   CREATININE 1.11* 1.01* 0.79  CALCIUM 8.9 8.5* 8.6*   LFT Recent Labs    07/15/23 1743  PROT 7.3  ALBUMIN 3.7  AST 29  ALT 19  ALKPHOS 66  BILITOT 0.8   PT/INR No results for input(s): "LABPROT", "INR" in the last 72 hours.       Assessment / Plan:    #13 67 year old African-American female with history of recurrent anemia and GI bleeding in setting of chronic Eliquis for atrial fibrillation Admitted with 1 week  history of melena and complaints of fatigue Hemoglobin was 8 on 07/12/2023, repeat on admit 7.1  She has now had a total of 2 units of packed RBCs since admission and hemoglobin is 7.6 this a.m. Eliquis has been on hold No further melena Hemodynamically stable  History of clean-based gastric ulcer on EGD March 2024 and prior history of both gastric and small bowel AVMs on enteroscopy November 2023 treated with APC.  Suspect recurrent bleeding secondary to recurrent and/or additional AVMs  2. anemia acute on chronic secondary to GI blood loss 3.  Thalassemia 4.  Mitral stenosis 5.  Diabetes mellitus 6.  Interstitial lung disease/COPD on chronic oxygen 3 L 5.  Hypertension 6.  Rheumatoid arthritis  Plan; patient is scheduled for enteroscopy with Dr. Marina Goodell this afternoon, depending on findings capsule endoscopy may also be placed at the time of enteroscopy.  Continue to trend hemoglobin and transfuse for hemoglobin 7 or less especially in setting of comorbidities with chronic oxygen use.  Continue twice daily PPI  Continue to hold Eliquis.     Principal Problem:   Occult GI bleeding Active Problems:   Essential hypertension   Anxiety   Rheumatoid arthritis (HCC)   PAF (paroxysmal atrial fibrillation) (HCC); CHA2DS2-VASc score 7   ABLA (acute blood loss  anemia)   Dyslipidemia   Type 2 diabetes mellitus without complications (HCC)   COPD (chronic obstructive pulmonary disease) (HCC)   Obesity, Class III, BMI 40-49.9 (morbid obesity) (HCC)     LOS: 2 days   Amy EsterwoodPA-C 07/17/2023, 9:30 AM  GI ATTENDING  Agree with interval progress note as outlined.  Interval data and history reviewed.  Plans for enteroscopy plus or minus capsule endoscopy this afternoon.The nature of the procedure, as well as the risks, benefits, and alternatives were carefully and thoroughly reviewed with the patient. Ample time for discussion and questions allowed. The patient understood, was  satisfied, and agreed to proceed.  Wilhemina Bonito. Eda Keys., M.D. [facility] Division of Gastroenterology

## 2023-07-17 NOTE — Anesthesia Procedure Notes (Addendum)
Procedure Name: MAC Date/Time: 07/17/2023 1:57 PM  Performed by: Ludwig Lean, CRNAPre-anesthesia Checklist: Patient identified, Emergency Drugs available, Suction available and Patient being monitored Patient Re-evaluated:Patient Re-evaluated prior to induction Oxygen Delivery Method: Simple face mask Placement Confirmation: positive ETCO2 and breath sounds checked- equal and bilateral Dental Injury: Teeth and Oropharynx as per pre-operative assessment

## 2023-07-17 NOTE — Anesthesia Preprocedure Evaluation (Signed)
Anesthesia Evaluation  Patient identified by MRN, date of birth, ID band Patient awake    Reviewed: Allergy & Precautions, NPO status , Patient's Chart, lab work & pertinent test results  Airway Mallampati: III  TM Distance: >3 FB Neck ROM: Full    Dental  (+) Missing   Pulmonary sleep apnea , COPD,  COPD inhaler and oxygen dependent, former smoker   Pulmonary exam normal        Cardiovascular hypertension, Pt. on medications + angina  + CAD, + Past MI and +CHF  Normal cardiovascular exam+ dysrhythmias + Valvular Problems/Murmurs   ECHO: 1. Left ventricular ejection fraction, by estimation, is 60 to 65%. The  left ventricle has normal function. The left ventricle has no regional  wall motion abnormalities. There is mild concentric left ventricular  hypertrophy. Left ventricular diastolic  parameters are consistent with Grade I diastolic dysfunction (impaired  relaxation).   2. Right ventricular systolic function is normal. The right ventricular  size is normal.   3. Left atrial size was moderately dilated.   4. Large calcified mass at posterior mitral annulus is likely exuberant  mitral annular calcification. The mitral valve is degenerative. No  evidence of mitral valve regurgitation. Moderate mitral stenosis. The mean  mitral valve gradient is 12.0 mmHg, MVA   1.5 cm^2. Severe mitral annular calcification.   5. The aortic valve is tricuspid. There is moderate calcification of the  aortic valve. Aortic valve regurgitation is not visualized. Aortic valve  sclerosis/calcification is present, without any evidence of aortic  stenosis.   6. The inferior vena cava is normal in size with greater than 50%  respiratory variability, suggesting right atrial pressure of 3 mmHg.     Neuro/Psych  Headaches PSYCHIATRIC DISORDERS Anxiety Depression     Neuromuscular disease    GI/Hepatic Neg liver ROS, PUD,GERD  Medicated and  Controlled,,  Endo/Other  diabetes, Insulin Dependent  Morbid obesity  Renal/GU Renal disease     Musculoskeletal negative musculoskeletal ROS (+)    Abdominal  (+) + obese  Peds  Hematology negative hematology ROS (+)   Anesthesia Other Findings   Reproductive/Obstetrics                              Anesthesia Physical Anesthesia Plan  ASA: 4  Anesthesia Plan: MAC   Post-op Pain Management:    Induction:   PONV Risk Score and Plan: 2 and Propofol infusion and Treatment may vary due to age or medical condition  Airway Management Planned: Nasal Cannula  Additional Equipment:   Intra-op Plan:   Post-operative Plan:   Informed Consent: I have reviewed the patients History and Physical, chart, labs and discussed the procedure including the risks, benefits and alternatives for the proposed anesthesia with the patient or authorized representative who has indicated his/her understanding and acceptance.     Dental advisory given  Plan Discussed with: CRNA  Anesthesia Plan Comments:          Anesthesia Quick Evaluation

## 2023-07-17 NOTE — Anesthesia Procedure Notes (Signed)
Procedure Name: Intubation Date/Time: 07/17/2023 2:18 PM  Performed by: Ludwig Lean, CRNAPre-anesthesia Checklist: Patient identified, Emergency Drugs available, Suction available and Patient being monitored Patient Re-evaluated:Patient Re-evaluated prior to induction Oxygen Delivery Method: Circle system utilized Preoxygenation: Pre-oxygenation with 100% oxygen Induction Type: IV induction Ventilation: Mask ventilation without difficulty Laryngoscope Size: Mac and 4 Grade View: Grade II Tube type: Oral Tube size: 7.0 mm Number of attempts: 1 Airway Equipment and Method: Stylet and Oral airway Placement Confirmation: ETT inserted through vocal cords under direct vision, positive ETCO2 and breath sounds checked- equal and bilateral Secured at: 21 cm Tube secured with: Tape Dental Injury: Teeth and Oropharynx as per pre-operative assessment

## 2023-07-18 ENCOUNTER — Inpatient Hospital Stay (HOSPITAL_COMMUNITY): Payer: Medicare Other

## 2023-07-18 DIAGNOSIS — K5521 Angiodysplasia of colon with hemorrhage: Secondary | ICD-10-CM

## 2023-07-18 DIAGNOSIS — K921 Melena: Secondary | ICD-10-CM | POA: Diagnosis not present

## 2023-07-18 DIAGNOSIS — D649 Anemia, unspecified: Secondary | ICD-10-CM | POA: Diagnosis not present

## 2023-07-18 DIAGNOSIS — Z7901 Long term (current) use of anticoagulants: Secondary | ICD-10-CM | POA: Diagnosis not present

## 2023-07-18 DIAGNOSIS — R195 Other fecal abnormalities: Secondary | ICD-10-CM | POA: Diagnosis not present

## 2023-07-18 DIAGNOSIS — D62 Acute posthemorrhagic anemia: Secondary | ICD-10-CM | POA: Diagnosis not present

## 2023-07-18 LAB — COMPREHENSIVE METABOLIC PANEL
ALT: 18 U/L (ref 0–44)
AST: 18 U/L (ref 15–41)
Albumin: 3.3 g/dL — ABNORMAL LOW (ref 3.5–5.0)
Alkaline Phosphatase: 58 U/L (ref 38–126)
Anion gap: 10 (ref 5–15)
BUN: 8 mg/dL (ref 8–23)
CO2: 25 mmol/L (ref 22–32)
Calcium: 8.5 mg/dL — ABNORMAL LOW (ref 8.9–10.3)
Chloride: 103 mmol/L (ref 98–111)
Creatinine, Ser: 1 mg/dL (ref 0.44–1.00)
GFR, Estimated: >60 mL/min (ref >60)
Glucose, Bld: 153 mg/dL — ABNORMAL HIGH (ref 70–99)
Potassium: 4.1 mmol/L (ref 3.5–5.1)
Sodium: 138 mmol/L (ref 135–145)
Total Bilirubin: 0.5 mg/dL (ref 0.3–1.2)
Total Protein: 6.6 g/dL (ref 6.5–8.1)

## 2023-07-18 LAB — GLUCOSE, CAPILLARY
Glucose-Capillary: 139 mg/dL — ABNORMAL HIGH (ref 70–99)
Glucose-Capillary: 127 mg/dL — ABNORMAL HIGH (ref 70–99)
Glucose-Capillary: 119 mg/dL — ABNORMAL HIGH (ref 70–99)
Glucose-Capillary: 165 mg/dL — ABNORMAL HIGH (ref 70–99)

## 2023-07-18 LAB — CBC
HCT: 26 % — ABNORMAL LOW (ref 36.0–46.0)
Hemoglobin: 7.6 g/dL — ABNORMAL LOW (ref 12.0–15.0)
MCH: 20.5 pg — ABNORMAL LOW (ref 26.0–34.0)
MCHC: 29.2 g/dL — ABNORMAL LOW (ref 30.0–36.0)
MCV: 70.1 fL — ABNORMAL LOW (ref 80.0–100.0)
Platelets: 210 10*3/uL (ref 150–400)
RBC: 3.71 MIL/uL — ABNORMAL LOW (ref 3.87–5.11)
RDW: 22.2 % — ABNORMAL HIGH (ref 11.5–15.5)
WBC: 11.6 10*3/uL — ABNORMAL HIGH (ref 4.0–10.5)
nRBC: 0 % (ref 0.0–0.2)

## 2023-07-18 LAB — HEMOGLOBIN AND HEMATOCRIT, BLOOD
HCT: 27.4 % — ABNORMAL LOW (ref 36.0–46.0)
Hemoglobin: 8.2 g/dL — ABNORMAL LOW (ref 12.0–15.0)

## 2023-07-18 LAB — PREPARE RBC (CROSSMATCH)

## 2023-07-18 MED ORDER — SODIUM CHLORIDE 0.9% IV SOLUTION: Freq: Once | INTRAVENOUS | Status: AC

## 2023-07-18 MED ORDER — FUROSEMIDE 10 MG/ML IJ SOLN
20.0000 mg | Freq: Once | INTRAMUSCULAR | Status: AC
  Administered 2023-07-18: 20 mg via INTRAVENOUS
  Filled 2023-07-18: qty 2

## 2023-07-18 NOTE — Plan of Care (Signed)
Plan of Care reviewed. 

## 2023-07-18 NOTE — Progress Notes (Signed)
Progress Note   Patient: Lynn [name] GYI:[id number] DOB: 01-08-1956 DOA: 07/15/2023     3 DOS: the patient was seen and examined on 07/18/2023   Brief hospital course: 67yo with h/o recurrent GI bleeding from gastric ulcer/AVMs, HTN, HLD, chronic diastolic CHF, COPD, CAD, CVA, OSA, DM, and morbid obesity who presented on 9/30 with tarry stools, Hgb 7.1.  She was started on IV Protonix and GI was consulted.  Assessment and Plan: Subacute upper GI bleeding -Patient with h/o gastric ulcer and AVMs presenting with melena, suggestive of upper GI bleeding. -GI consulted. Pt now s/p EGD 10/2 which was found to be normal. Capsule was placed. Per GI, found to have active SB bleeding into study.  -GI recs for deep enteroscopy planned  -cont protonix 40 BID   ABLA -Patient's lightheadedness and fatigue are most likely caused by anemia secondary to upper GI bleeding.  -s/p 3 units PRBC's thus far -recheck cbc in AM   Essential hypertension/CAD -Continue diltiazem, Imdur, losartan   Dyslipidemia -Continue atorvastatin   Rheumatoid arthritis (HCC) -Continue Plaquenil, Arva   Type 2 diabetes mellitus without complications (HCC) -She wears an Omnipod but reports no need for glargine since being on Mounjaro -Pior A1c was 6.9 in July, good coverage -Contiue sensitive-scale SSI for now   Anxiety -Continue alprazolam   COPD -Wears 3-4L home O2, on home O2 at this time -Continue Albuterol   Afib -Hold Eliquis in the setting of GI bleed -Rate controlled with diltiazem   Morbid obesity -Has been on Mounjaro with improvement -Body mass index is 41.79 kg/m..  -Ongoing weight loss should be encouraged -Outpatient PCP/bariatric medicine/bariatric surgery f/u encouraged    Subjective: Without complaints. Undergoing PRBC transfusion when seen this AM  Physical Exam: Vitals:   07/18/23 1301 07/18/23 1323 07/18/23 1417 07/18/23 1551  BP: 121/65 (!) 111/57 111/60 116/78   Pulse: 72 77 74   Resp: 18 18 17 18   Temp: 98.1 F (36.7 C) 98.1 F (36.7 C) 97.9 F (36.6 C) 98.3 F (36.8 C)  TempSrc: Oral Oral Oral Oral  SpO2: 95% 98% 97% 96%  Weight:      Height:       General exam: Conversant, in no acute distress Respiratory system: normal chest rise, clear, no audible wheezing Cardiovascular system: regular rhythm, s1-s2 Gastrointestinal system: Nondistended, nontender, pos BS Central nervous system: No seizures, no tremors Extremities: No cyanosis, no joint deformities Skin: No rashes, no pallor Psychiatry: Affect normal // no auditory hallucinations   Data Reviewed:  Labs reviewed: Na 138, K 4.1, Cr 1.00, WBC 11.6, Hgb 7.6  Family Communication: Pt in room, family not at bedside  Disposition: Status is: Inpatient Remains inpatient appropriate because: severity of illness  Planned Discharge Destination: Home     Author: Rickey Barbara, MD 07/18/2023 5:39 PM  For on call review [email or web address].

## 2023-07-18 NOTE — Anesthesia Postprocedure Evaluation (Signed)
Anesthesia Post Note  Patient: Charity Tessier  Procedure(s) Performed: ENTEROSCOPY SUBMUCOSAL TATTOO INJECTION GIVENS CAPSULE STUDY     Patient location during evaluation: PACU Anesthesia Type: MAC Level of consciousness: awake Pain management: pain level controlled Vital Signs Assessment: post-procedure vital signs reviewed and stable Respiratory status: spontaneous breathing, nonlabored ventilation and respiratory function stable Cardiovascular status: blood pressure returned to baseline and stable Postop Assessment: no apparent nausea or vomiting Anesthetic complications: no   No notable events documented.  Last Vitals:  Vitals:   07/17/23 1953 07/18/23 0557  BP: 120/62 137/67  Pulse: 77 91  Resp: 16 16  Temp: 36.8 C 37.1 C  SpO2: 98% 90%    Last Pain:  Vitals:   07/18/23 0557  TempSrc: Oral  PainSc:                  Catheryn Bacon Danner [place]

## 2023-07-18 NOTE — Progress Notes (Addendum)
Gastroenterology Progress Note  CC:  Melena and anemia in setting of Eliquis   Subjective: She describes having a band or soreness across her mid abdomen since her enteroscopy yesterday which is exacerbated by moving around in bed, rated 8 our of 10 which decreased after taking Tylenol. She passed a loose dark brown, not black, stool yesterday evening and a similar BM around 9am this morning. No bright red blood per the rectum. No CP or SOB. RN at the bedside.    Objective:   Small bowel enteroscopy with deployment of small bowel video capsule 07/17/2023: - Normal esophagus.  - Normal stomach.  - Normal examined jejunum and duodenum. Tattoo placed at distal extent of exam.  - No specimens collected Note, patient had transient hypoxia during enteroscopy and was converted from MAC to general anesthesia, procedure was completed without further incident or respiratory distress.  Vital signs in last 24 hours: Temp:  [97.2 F (36.2 C)-98.7 F (37.1 C)] 98.7 F (37.1 C) (10/03 0557) Pulse Rate:  [77-91] 91 (10/03 0557) Resp:  [15-26] 16 (10/03 0557) BP: (112-163)/(55-139) 137/67 (10/03 0557) SpO2:  [89 %-100 %] 96 % (10/03 0758) Weight:  [107.5 kg] 107.5 kg (10/02 1254) Last BM Date : 07/15/23 General: 67 year old obese female in no acute distress. Eyes: Conjunctiva pink, no scleral icterus.  Heart: Regular rate and rhythm, no murmurs. Pulm: Breath sounds with crackles to the bases L > R otherwise clear on oxygen 3 L nasal cannula. Abdomen: Obese abdomen.  Mild epigastric tenderness without rebound or guarding.  Positive bowel sounds to all 4 quadrants.  No bruit.  Vitiligo discoloration present to abdomen. Extremities: No edema. Neurologic:  Alert and  oriented x 4.  Speech is clear.  Moves all extremities equally. Psych:  Alert and cooperative. Normal mood and affect.  Intake/Output from previous day: 10/02 0701 - 10/03 0700 In: 1241.8 [I.V.:1241.8] Out: 0   Intake/Output this shift: No intake/output data recorded.  Lab Results: Recent Labs    07/16/23 0558 07/17/23 0512 07/18/23 0539  WBC 8.1 7.6 11.6*  HGB 7.1*  7.0* 7.6* 7.6*  HCT 24.3*  24.1* 25.8* 26.0*  PLT 210 201 210   BMET Recent Labs    07/16/23 0558 07/17/23 0512 07/18/23 0539  NA 136 136 138  K 3.1* 3.4* 4.1  CL 100 101 103  CO2 27 25 25   GLUCOSE 135* 133* 153*  BUN 13 9 8   CREATININE 1.01* 0.79 1.00  CALCIUM 8.5* 8.6* 8.5*   LFT Recent Labs    07/18/23 0539  PROT 6.6  ALBUMIN 3.3*  AST 18  ALT 18  ALKPHOS 58  BILITOT 0.5   PT/INR No results for input(s): "LABPROT", "INR" in the last 72 hours. Hepatitis Panel No results for input(s): "HEPBSAG", "HCVAB", "HEPAIGM", "HEPBIGM" in the last 72 hours.  No results found.  Assessment / Plan:  67 year old African-American female with history of recurrent anemia and GI bleeding in setting of chronic Eliquis for atrial fibrillation admitted to the hospital 07/15/2023 with fatigue and melena x 1 week. Admission Hg 7.3 (Hg 8.0 on 07/12/2023) -> transfused 1 unit of PRBCs -> Hg 7.6 -> 7.0 transfused 1 unit of PRBCs on 10/1 -> Hg 7.2 -> today Hg 7.6. Prior history of a clean-based gastric ulcer on EGD March 2024, and previous history of both gastric and small bowel AVMs on enteroscopy November 2023. Enteroscopy done during this hospitalization 10/2 was normal and VCE capsule was placed, results pending. She  passed a dark brown loose stool yesterday evening and x 1 this morning. She endorses having a band of abdominal pain/soreness across her mid abdomen which started following her enteroscopy yesterday, decreased after taking Tylenol. Hemodynamically stable.  -Await small bowel capsule endoscopy results -CBC in a.m. -Transfuse for hemoglobin less than 7 -1 unit of PRBCs ordered  -Check H/H post transfusion  -Continue PPI IV twice daily -Soft diet -Monitor the patient for active GI bleeding -Pain management per the  hospitalist  -Abdominal xray, low suspicion for perforation -CTAP if abdominal pain worsens   Mild leukocytosis, WBC 11.6.  Afebrile.    Acute on chronic microcytic anemia secondary to GI blood loss. History of Thalassemia.  -CBC, Iron, TIBC, ferritin and B12 in am  Atrial fibrillation, last dose of Eliquis was a.m. on 07/15/2023  Mitral stenosis  Diabetes mellitus type II  Interstitial lung disease/COPD on chronic oxygen 3 L  Chronic diastolic CHF  Rheumatoid arthritis on Plaquenil and folic acid    Principal Problem:   Occult GI bleeding Active Problems:   Essential hypertension   Anxiety   Rheumatoid arthritis (HCC)   PAF (paroxysmal atrial fibrillation) (HCC); CHA2DS2-VASc score 7   ABLA (acute blood loss anemia)   Dyslipidemia   Type 2 diabetes mellitus without complications (HCC)   COPD (chronic obstructive pulmonary disease) (HCC)   Obesity, Class III, BMI 40-49.9 (morbid obesity) (HCC)     LOS: 3 days   Malachi Carl Kennedy-Smith  07/18/2023, 10:17 AM  GI ATTENDING  Interval history data reviewed.  Patient seen and examined.  Agree with notable progress note as outlined above.  Negative enteroscopy yesterday.  Mild post procedure abdominal discomfort may be secondary to small bowel submucosal tattoo injection.  Capsule placement at the time of enteroscopy.  We have tried to read this today.  Fortunately, no further clinical bleeding.  Hemoglobin stable.  Will transfuse.  Advance diet.  Will follow.  Wilhemina Bonito. Eda Keys., M.D. [facility] Division of Gastroenterology

## 2023-07-18 NOTE — Progress Notes (Signed)
Pt scheduled for a balloon enteroscopy at [facility] 10/4 @ 1115.  [facility] notified, endo arrival time approximately 0945.  Bedside RN aware.   Roselie Awkward, RN

## 2023-07-18 NOTE — Progress Notes (Signed)
GI CAPSULE ENDOSCOPY PRELIMINARY REPORT  Review of capsule endoscopy shows active small bowel bleeding 50 minutes into the study. The cecum was reached at approximately 3 hours and 42 minutes.  Impression: 1.  Active small bowel bleeding beyond the reach of the standard endoscope  Recommendations: 1.  Patient will need deep enteroscopy.  If not here, will need tertiary transfer.  GI team is working on the details.  Patient notified by GI advanced practitioner  2.  Back on clear liquids 3.  Monitor blood counts and stools 4.  Continue to hold anticoagulation 4.  Transfuse as needed  Wilhemina Bonito. Eda Keys., M.D. [facility] Division of Gastroenterology

## 2023-07-18 NOTE — Anesthesia Postprocedure Evaluation (Signed)
Anesthesia Post Note  Patient: Lynn [name]  Procedure(s) Performed: ENTEROSCOPY SUBMUCOSAL TATTOO INJECTION GIVENS CAPSULE STUDY     Patient location during evaluation: Endoscopy Anesthesia Type: General Level of consciousness: awake Pain management: pain level controlled Vital Signs Assessment: post-procedure vital signs reviewed and stable Respiratory status: spontaneous breathing, nonlabored ventilation and respiratory function stable Cardiovascular status: blood pressure returned to baseline and stable Postop Assessment: no apparent nausea or vomiting Anesthetic complications: no   No notable events documented.  Last Vitals:  Vitals:   07/17/23 1953 07/18/23 0557  BP: 120/62 137/67  Pulse: 77 91  Resp: 16 16  Temp: 36.8 C 37.1 C  SpO2: 98% 90%    Last Pain:  Vitals:   07/18/23 0557  TempSrc: Oral  PainSc:                  Catheryn Bacon [place]

## 2023-07-18 NOTE — TOC CM/SW Note (Signed)
Transition of Care [facility]) - Inpatient Brief Assessment   Patient Details  Name: Kalayna Noy MRN: [id number] Date of Birth: July 27, 1956  Transition of Care [facility]) CM/SW Contact:    Otelia Santee, LCSW Phone Number: 07/18/2023, 3:39 PM   Clinical Narrative: Pt from home. Pt on 3L O2 at baseline. TOC will follow.   Transition of Care Asessment: Insurance and Status: Insurance coverage has been reviewed Patient has primary care physician: Yes Home environment has been reviewed: Home w/ spouse Prior level of function:: Independent/Modified independent Prior/Current Home Services: No current home services Social Determinants of Health Reivew: SDOH reviewed no interventions necessary Readmission risk has been reviewed: Yes Transition of care needs: transition of care needs identified, TOC will continue to follow

## 2023-07-19 ENCOUNTER — Inpatient Hospital Stay (HOSPITAL_COMMUNITY): Payer: Medicare Other | Admitting: Registered Nurse

## 2023-07-19 ENCOUNTER — Inpatient Hospital Stay (HOSPITAL_COMMUNITY): Payer: Medicare Other

## 2023-07-19 ENCOUNTER — Encounter (HOSPITAL_COMMUNITY)
Admission: EM | Disposition: A | Discharge status: Home / Self Care | Payer: Self-pay | Source: Home / Self Care | Attending: Internal Medicine

## 2023-07-19 DIAGNOSIS — I5022 Chronic systolic (congestive) heart failure: Secondary | ICD-10-CM

## 2023-07-19 DIAGNOSIS — D649 Anemia, unspecified: Secondary | ICD-10-CM | POA: Diagnosis not present

## 2023-07-19 DIAGNOSIS — D62 Acute posthemorrhagic anemia: Secondary | ICD-10-CM | POA: Diagnosis not present

## 2023-07-19 DIAGNOSIS — K921 Melena: Secondary | ICD-10-CM | POA: Diagnosis not present

## 2023-07-19 LAB — BPAM RBC
Blood Product Expiration Date: 202410252359
Blood Product Expiration Date: 202410262359
Blood Product Expiration Date: 202410302359
ISSUE DATE / TIME: 202409302204
ISSUE DATE / TIME: 202410012000
ISSUE DATE / TIME: 202410031249
Unit Type and Rh: 5100
Unit Type and Rh: 5100
Unit Type and Rh: 5100

## 2023-07-19 LAB — TYPE AND SCREEN
ABO/RH(D): O POS
Antibody Screen: NEGATIVE
Unit division: 0
Unit division: 0
Unit division: 0

## 2023-07-19 LAB — GLUCOSE, CAPILLARY
Glucose-Capillary: 141 mg/dL — ABNORMAL HIGH (ref 70–99)
Glucose-Capillary: 168 mg/dL — ABNORMAL HIGH (ref 70–99)
Glucose-Capillary: 124 mg/dL — ABNORMAL HIGH (ref 70–99)
Glucose-Capillary: 177 mg/dL — ABNORMAL HIGH (ref 70–99)

## 2023-07-19 LAB — CBC
HCT: 33.2 % — ABNORMAL LOW (ref 36.0–46.0)
Hemoglobin: 9.7 g/dL — ABNORMAL LOW (ref 12.0–15.0)
MCH: 21.4 pg — ABNORMAL LOW (ref 26.0–34.0)
MCHC: 29.2 g/dL — ABNORMAL LOW (ref 30.0–36.0)
MCV: 73.3 fL — ABNORMAL LOW (ref 80.0–100.0)
Platelets: 195 10*3/uL (ref 150–400)
RBC: 4.53 MIL/uL (ref 3.87–5.11)
RDW: 24.8 % — ABNORMAL HIGH (ref 11.5–15.5)
WBC: 10 10*3/uL (ref 4.0–10.5)
nRBC: 0 % (ref 0.0–0.2)

## 2023-07-19 LAB — BASIC METABOLIC PANEL
Anion gap: 12 (ref 5–15)
BUN: 9 mg/dL (ref 8–23)
CO2: 23 mmol/L (ref 22–32)
Calcium: 8.7 mg/dL — ABNORMAL LOW (ref 8.9–10.3)
Chloride: 104 mmol/L (ref 98–111)
Creatinine, Ser: 0.94 mg/dL (ref 0.44–1.00)
GFR, Estimated: >60 mL/min (ref >60)
Glucose, Bld: 137 mg/dL — ABNORMAL HIGH (ref 70–99)
Potassium: 4 mmol/L (ref 3.5–5.1)
Sodium: 139 mmol/L (ref 135–145)

## 2023-07-19 LAB — BRAIN NATRIURETIC PEPTIDE: B Natriuretic Peptide: 237.5 pg/mL — ABNORMAL HIGH (ref 0.0–100.0)

## 2023-07-19 SURGERY — CANCELLED PROCEDURE

## 2023-07-19 MED ORDER — IPRATROPIUM-ALBUTEROL 0.5-2.5 (3) MG/3ML IN SOLN
3.0000 mL | Freq: Three times a day (TID) | RESPIRATORY_TRACT | Status: DC
  Administered 2023-07-20 – 2023-07-24 (×13): 3 mL via RESPIRATORY_TRACT
  Filled 2023-07-19 (×13): qty 3

## 2023-07-19 MED ORDER — IPRATROPIUM-ALBUTEROL 0.5-2.5 (3) MG/3ML IN SOLN
3.0000 mL | Freq: Four times a day (QID) | RESPIRATORY_TRACT | Status: DC
  Administered 2023-07-19 (×2): 3 mL via RESPIRATORY_TRACT
  Filled 2023-07-19 (×2): qty 3

## 2023-07-19 MED ORDER — FUROSEMIDE 40 MG PO TABS
40.0000 mg | ORAL_TABLET | Freq: Every day | ORAL | Status: DC
  Administered 2023-07-19 – 2023-07-24 (×6): 40 mg via ORAL
  Filled 2023-07-19 (×6): qty 1

## 2023-07-19 MED ORDER — SODIUM CHLORIDE 0.9 % IV SOLN: INTRAVENOUS | Status: DC

## 2023-07-19 MED ORDER — ALBUTEROL SULFATE (2.5 MG/3ML) 0.083% IN NEBU: 2.5000 mg | INHALATION_SOLUTION | RESPIRATORY_TRACT | Status: DC | PRN

## 2023-07-19 NOTE — Progress Notes (Signed)
Patient Name: Annyka Kienbaum           DOB: [birth year]  MRN: [id number]      Admission Date: 07/15/2023  Attending Provider: Jerald Kief, MD  Primary Diagnosis: Occult GI bleeding   Level of care: Med-Surg    CROSS COVER NOTE   Date of Service   07/19/2023   Duaa Beveridge, 67 y.o. female, was admitted on 07/15/2023 for Occult GI bleeding.    HPI/Events of Note   Dyspnea on minimal exertion, orthopnea, inc WOB VSS, currently on 3 L [place] with optimal oxygen saturation. Has some nonpitting edema to lower extremities and fine crackles.  Some improvement after neb tx.   Will stop continuous IV fluids and administer 20 mg IV Lasix.  BP stable.  Creatinine 1.00.   Interventions/ Plan   Nebulizer PRN IV Lasix, 20 mg Stop IV fluids        Anthoney Harada, DNP, [employer] [facility] [place]

## 2023-07-19 NOTE — Progress Notes (Signed)
Progress Note   Patient: Lynn [name] KVQ:[id number] DOB: June 23, 1956 DOA: 07/15/2023     4 DOS: the patient was seen and examined on 07/19/2023   Brief hospital course: 67yo with h/o recurrent GI bleeding from gastric ulcer/AVMs, HTN, HLD, chronic diastolic CHF, COPD, CAD, CVA, OSA, DM, and morbid obesity who presented on 9/30 with tarry stools, Hgb 7.1.  She was started on IV Protonix and GI was consulted.  Assessment and Plan: Subacute upper GI bleeding -Patient with h/o gastric ulcer and AVMs presenting with melena, suggestive of upper GI bleeding. -GI consulted. Pt now s/p EGD 10/2 which was found to be normal. Capsule was placed. Per GI, found to have active SB bleeding into study.  -GI recs for deep enteroscopy planned, held secondary to overnight event below -cont protonix 40 BID   ABLA -Patient's lightheadedness and fatigue are most likely caused by anemia secondary to upper GI bleeding.  -s/p 3 units PRBC's thus far -recheck cbc in AM   Essential hypertension/CAD -Continue diltiazem, Imdur, losartan   Dyslipidemia -Continue atorvastatin   Rheumatoid arthritis (HCC) -Continue Plaquenil, Arva   Type 2 diabetes mellitus without complications (HCC) -She wears an Omnipod but reports no need for glargine since being on Mounjaro -Pior A1c was 6.9 in July, good coverage -Contiue sensitive-scale SSI for now   Anxiety -Continue alprazolam   COPD -Wears 3-4L home O2, on home O2 at this time -Continue Albuterol as needed. -Pt reports using q6h nebs at home. Will cont on Q6h duonebs here   Afib -Hold Eliquis in the setting of GI bleed -Rate controlled with diltiazem   Morbid obesity -Has been on Mounjaro with improvement -Body mass index is 41.79 kg/m..  -Ongoing weight loss should be encouraged -Outpatient PCP/bariatric medicine/bariatric surgery f/u encouraged   Acutely decompensated diastolic CHF exacerbation -Overnight events noted. Concerns of  worsening sob and acute CHF exacerbation in the setting of receiving 3 units PRBC's this visit -Improvement with po lasix, will continue on 40mg  po lasix -follow I/o and daily wts   Subjective: Increased sob overnight, needing lasix  Physical Exam: Vitals:   07/19/23 0453 07/19/23 0810 07/19/23 1311 07/19/23 1433  BP: (!) 164/98  113/61   Pulse: 92  81   Resp: 18  17   Temp: 98.7 F (37.1 C)  97.8 F (36.6 C)   TempSrc: Oral     SpO2: 95% 95% 97% 96%  Weight:      Height:       General exam: Awake, laying in bed, in nad Respiratory system: Normal respiratory effort, no wheezing Cardiovascular system: regular rate, s1, s2 Gastrointestinal system: Soft, nondistended, positive BS Central nervous system: CN2-12 grossly intact, strength intact Extremities: Perfused, no clubbing Skin: Normal skin turgor, no notable skin lesions seen Psychiatry: Mood normal // no visual hallucinations   Data Reviewed:  Labs reviewed: Na 139, K 4.0, Cr 0.94, WBC 10.0, Hgb 9.7, Plts 195  Family Communication: Pt in room, family at bedside  Disposition: Status is: Inpatient Remains inpatient appropriate because: severity of illness  Planned Discharge Destination: Home     Author: Rickey Barbara, MD 07/19/2023 5:51 PM  For on call review [email or web address].

## 2023-07-19 NOTE — Progress Notes (Addendum)
[facility] Progress Note  CC:   Melena and anemia in setting of Eliquis   Subjective: She developed SOB early am. Received Lasix IV with successful diuresis and resolution of SOB. Patient stated she usually takes Lasix at home but was not on this medication this admission. No CP. She remains on oxygen 3 - 4LNC. NPO. She continues to have mild soreness across her mid abdomen which occurs when she moves in bed. No BM or rectal bleeding overnight.   Objective:  Small bowel enteroscopy with deployment of small bowel video capsule 07/17/2023: - Normal esophagus.  - Normal stomach.  - Normal examined jejunum and duodenum. Tattoo placed at distal extent of exam.  - No specimens collected Note, patient had transient hypoxia during enteroscopy and was converted from MAC to general anesthesia, procedure was completed without further incident or respiratory distress.  Small bowel capsule endoscopy:  Active small bowel bleeding 50 minutes into the study. The cecum was reached at approximately 3 hours and 42 minutes.  Impression: 1.  Active small bowel bleeding beyond the reach of the standard endoscope   Vital signs in last 24 hours: Temp:  [97.9 F (36.6 C)-98.7 F (37.1 C)] 98.7 F (37.1 C) (10/04 0453) Pulse Rate:  [72-92] 92 (10/04 0453) Resp:  [17-18] 18 (10/04 0453) BP: (111-164)/(57-98) 164/98 (10/04 0453) SpO2:  [89 %-98 %] 95 % (10/04 0453) Last BM Date : 07/18/23 General: 67 year old female in NAD.  Heart: Irregular rhythm, no murmurs.  Pulm: Breath sounds with crackles to the bases L > R otherwise clear on oxygen 4 L nasal cannula.  Abdomen: Obese abdomen. Soft. Nontender. Nondistended. Positive bowel sounds x 4 quads. No palpable mass. Upper abdominal burn scar intact. No bruit. Extremities: No edema.  Neurologic:  Alert and  oriented x 4. Grossly normal neurologically.  Psych:  Alert and cooperative. Normal mood and affect.  Intake/Output from previous  day: 10/03 0701 - 10/04 0700 In: 373 [Blood:373] Out: 2950 [Urine:2950] Intake/Output this shift: No intake/output data recorded.  Lab Results: Recent Labs    07/17/23 0512 07/18/23 0539 07/18/23 1936 07/19/23 0533  WBC 7.6 11.6*  --  10.0  HGB 7.6* 7.6* 8.2* 9.7*  HCT 25.8* 26.0* 27.4* 33.2*  PLT 201 210  --  195   BMET Recent Labs    07/17/23 0512 07/18/23 0539 07/19/23 0533  NA 136 138 139  K 3.4* 4.1 4.0  CL 101 103 104  CO2 25 25 23   GLUCOSE 133* 153* 137*  BUN 9 8 9   CREATININE 0.79 1.00 0.94  CALCIUM 8.6* 8.5* 8.7*   LFT Recent Labs    07/18/23 0539  PROT 6.6  ALBUMIN 3.3*  AST 18  ALT 18  ALKPHOS 58  BILITOT 0.5   PT/INR No results for input(s): "LABPROT", "INR" in the last 72 hours. Hepatitis Panel No results for input(s): "HEPBSAG", "HCVAB", "HEPAIGM", "HEPBIGM" in the last 72 hours.  DG Abd Portable 1V  Result Date: 07/18/2023 CLINICAL DATA:  Acute lower abdominal pain since yesterday status post enterostomy. EXAM: PORTABLE ABDOMEN - 1 VIEW COMPARISON:  None Available. FINDINGS: The bowel gas pattern is normal. No radio-opaque calculi or other significant radiographic abnormality are seen. IMPRESSION: Negative. Electronically Signed   By: Lupita Raider M.D.   On: 07/18/2023 14:43    Assessment / Plan:  67 year old African-American female with history of recurrent anemia and GI bleeding in setting of chronic Eliquis for atrial fibrillation admitted to the  hospital 07/15/2023 with fatigue and melena x 1 week. Admission Hg 7.3 (Hg 8.0 on 07/12/2023) -> transfused 1 unit of PRBCs -> Hg 7.6 -> 7.0 transfused 1 unit of PRBCs on 10/1 -> Hg 7.2 -> Hg 7.6 -> transfused 1 unit of PRBCs -> Hg 8.2 -> today Hg 9.7 . Prior history of a clean-based gastric ulcer per EGD 12/2022, and previous history of both gastric and small bowel AVMs on enteroscopy 08/2022. Enteroscopy done during this hospitalization 10/2 was normal and VCE showed active bleeding 50 minutes into  the study beyond the reach of the standard endoscope. No BM or bloody stools over night. She endorses having central abdominal soreness when she moves in bed. She developed acute SOB early am, received Lasix 20mg  IV with successful diuresis. SOB resolved. No CP. -Stat chest xray -Plan today was to transport patient to [facility] for balloon enteroscopy with general anesthesia with Dr. Barron Alvine, to return to [facility] after procedure completed. However, due to suspected acute CHF/fluid overload this morning, balloon enteroscopy may need to be postponed. Await chest xray results -NPO -IV fluids per the hospitalist -Transfuse for hemoglobin less than 7 -CBC in am -Continue PPI IV twice daily -Monitor the patient for active GI bleeding -Pain management per the hospitalist  -CTAP if abdominal pain worsens  ADDENDUM: Chest xray showed left lower lobe atelectasis and/or consolidation with pleural effusion. Increased interstitial markings with bilateral hilar vascular congestion, favoring congestive heart failure/pulmonary edema. Balloon enteroscopy cancelled, will need to reschedule once cardiopulmonary status stabilized.  Recommend cardiology consult, cardiac clearance prior to proceeding with future balloon enteroscopy     Acute on chronic microcytic anemia secondary to GI blood loss. History of Thalassemia.  -See plan above   Atrial fibrillation, last dose of Eliquis was a.m. on 07/15/2023   Mitral stenosis   Diabetes mellitus type II   Interstitial lung disease/COPD on chronic oxygen 4 L [place]   Chronic diastolic CHF. Patient developed SOB early am, received Lasix 20mg  IV. BNP 237.5. -Stat chest xray   Rheumatoid arthritis on Plaquenil and folic acid      Principal Problem:   Occult GI bleeding Active Problems:   Essential hypertension   Anxiety   Rheumatoid arthritis (HCC)   PAF (paroxysmal atrial fibrillation) (HCC); CHA2DS2-VASc score 7   ABLA (acute blood loss anemia)   Dyslipidemia    Type 2 diabetes mellitus without complications (HCC)   COPD (chronic obstructive pulmonary disease) (HCC)   Obesity, Class III, BMI 40-49.9 (morbid obesity) (HCC)   AVM (arteriovenous malformation) of small bowel, acquired with hemorrhage     LOS: 4 days   Colleen M Kennedy-Smith  07/19/2023, 8:02AM  GI ATTENDING  Interval history, data, and events reviewed.  Patient seen and examined.  Agree with comprehensive interval progress note as outlined above. Patient has developed symptomatic heart failure overnight.  Being treated by primary service.  No further bleeding off anticoagulation.  Hemoglobin stable. The patient has multiple significant medical problems make this a difficult case.  She is tenuous for endoscopic evaluations but continues to have recurrent bleeding.  Stopping anticoagulation long-term could be a consideration, though obvious risks given her cardiac diseases.  This would need to be discussed with the patient by her cardiologist in terms of pros and cons.  In any event, she is not fit for endoscopic procedures at this time.  We will advance her diet and continue to monitor blood counts and stools.  Please transfuse if needed for hemoglobin less than 8.  GI will see her again on Monday but are available for interval questions.  Wilhemina Bonito. Eda Keys., M.D. [facility] Division of Gastroenterology

## 2023-07-19 NOTE — Progress Notes (Signed)
Pt with complaints of SOB and increased work of breathing. Pt with expiratory wheezes on auscultation. Pt given PRN albuterol nebulizer with minimal relief. Pt later found sitting up in chair and reporting SOB and orthopnea. Pt reports that she has a history of CHF and is normally on lasix. Pt appears to not have had lasix since admission. Pt with edema to BLLE, though non-pitting, and found to have fine crackles on auscultation. Overnight provider Anthoney Harada, NP made aware and orders placed to discontinue IVF, one time order for 20mg  IVP lasix, and BNP lab to be checked. Pt given IVP lasix and now with large amounts of clear, yellow urine output. Pt reports improvement in respiratory status after lasix. Will continue to monitor

## 2023-07-19 NOTE — Plan of Care (Signed)
Plan of Care reviewed. 

## 2023-07-20 DIAGNOSIS — D649 Anemia, unspecified: Secondary | ICD-10-CM | POA: Diagnosis not present

## 2023-07-20 DIAGNOSIS — K921 Melena: Secondary | ICD-10-CM | POA: Diagnosis not present

## 2023-07-20 DIAGNOSIS — D62 Acute posthemorrhagic anemia: Secondary | ICD-10-CM | POA: Diagnosis not present

## 2023-07-20 DIAGNOSIS — I5022 Chronic systolic (congestive) heart failure: Secondary | ICD-10-CM | POA: Diagnosis not present

## 2023-07-20 LAB — COMPREHENSIVE METABOLIC PANEL
ALT: 28 U/L (ref 0–44)
AST: 22 U/L (ref 15–41)
Albumin: 3.5 g/dL (ref 3.5–5.0)
Alkaline Phosphatase: 71 U/L (ref 38–126)
Anion gap: 13 (ref 5–15)
BUN: 8 mg/dL (ref 8–23)
CO2: 30 mmol/L (ref 22–32)
Calcium: 9.1 mg/dL (ref 8.9–10.3)
Chloride: 96 mmol/L — ABNORMAL LOW (ref 98–111)
Creatinine, Ser: 1.02 mg/dL — ABNORMAL HIGH (ref 0.44–1.00)
GFR, Estimated: 60 mL/min — ABNORMAL LOW (ref >60)
Glucose, Bld: 156 mg/dL — ABNORMAL HIGH (ref 70–99)
Potassium: 3.6 mmol/L (ref 3.5–5.1)
Sodium: 139 mmol/L (ref 135–145)
Total Bilirubin: 0.8 mg/dL (ref 0.3–1.2)
Total Protein: 7.5 g/dL (ref 6.5–8.1)

## 2023-07-20 LAB — CBC
HCT: 30.2 % — ABNORMAL LOW (ref 36.0–46.0)
Hemoglobin: 9 g/dL — ABNORMAL LOW (ref 12.0–15.0)
MCH: 21.3 pg — ABNORMAL LOW (ref 26.0–34.0)
MCHC: 29.8 g/dL — ABNORMAL LOW (ref 30.0–36.0)
MCV: 71.4 fL — ABNORMAL LOW (ref 80.0–100.0)
Platelets: 236 10*3/uL (ref 150–400)
RBC: 4.23 MIL/uL (ref 3.87–5.11)
RDW: 25 % — ABNORMAL HIGH (ref 11.5–15.5)
WBC: 9.1 10*3/uL (ref 4.0–10.5)
nRBC: 0 % (ref 0.0–0.2)

## 2023-07-20 LAB — GLUCOSE, CAPILLARY
Glucose-Capillary: 142 mg/dL — ABNORMAL HIGH (ref 70–99)
Glucose-Capillary: 187 mg/dL — ABNORMAL HIGH (ref 70–99)
Glucose-Capillary: 176 mg/dL — ABNORMAL HIGH (ref 70–99)
Glucose-Capillary: 161 mg/dL — ABNORMAL HIGH (ref 70–99)

## 2023-07-20 MED ORDER — TIRZEPATIDE 10 MG/0.5ML ~~LOC~~ SOAJ: 10.0000 mg | SUBCUTANEOUS | Status: DC

## 2023-07-20 MED ORDER — TIRZEPATIDE 10 MG/0.5ML ~~LOC~~ SOAJ
10.0000 mg | SUBCUTANEOUS | Status: DC
  Administered 2023-07-21: 10 mg via SUBCUTANEOUS

## 2023-07-20 NOTE — Progress Notes (Signed)
Epic chat sent to [employer]. Patient was asking if her husband could bring in her mounjaro and scan into our system. Her next dose is due tomorrow.

## 2023-07-20 NOTE — Progress Notes (Signed)
HISTORY OF PRESENT ILLNESS:  Lynn [name] is a 67 y.o. female admitted with acute on chronic anemia with melena while on chronic anticoagulation.  Underwent small bowel enteroscopy and capsule endoscopy with evidence of bleeding in the small bowel beyond the reach of the endoscope.  Plans for deep enteroscopy canceled due to congestive heart failure and respiratory distress.  Now doing better after diuresis.  She tells me that her bowel movements are normal looking.  Hemoglobin 9.0 (9.7).  BUN 8  REVIEW OF SYSTEMS:  All non-GI ROS negative. Past Medical History:  Diagnosis Date   Allergy 2018   Anxiety    CHF (congestive heart failure) (HCC)    Clotting disorder (HCC)    blood clot in eye 2016   COPD (chronic obstructive pulmonary disease) (HCC)    Coronary artery disease, non-occlusive    a. 11/2016 NSTEMI/Cath: LM nl, LAD 40p, 50md, D1/2 small, LCX 40ost, OM2/3 nl/small, RCA 35 ost/mid, RPDA/RPL small/nl.   Coronary vasospasm (HCC)    Depression    Diastolic dysfunction    a. 11/2016 Echo: EF 55-60%, gr1 DD, sev calcified MV annulus, mildly dil LA.   Eye hemorrhage 01/2015   "right; resolved" (07/03/2017)   Gastric ulcer    GERD (gastroesophageal reflux disease)    Headache    "monthly" (07/03/2017)   Heart murmur    History of blood transfusion    "when I had an ectopic pregnancy"   History of stomach ulcers    Hyperlipidemia    Hypertension    Microcytic anemia    Moderate mitral stenosis by prior echocardiogram 03/2021   TTE: Mod MS (mean gradient 9 mmHg). -- TEE Moderate-Severe MS (mean gradient 14 mmHg) with fixed Post MV Leaflet & Severe MAC w/ large circumscribed calcified mass on the Post MV Leaflet. (Mass previously described in 2018) - CMRI identifies calcified mass & not Tumor.; R&LHC - LVEDP & PCWP both 23 mmHg indicating minimal resting gradient   Myocardial infarction (HCC) 2023   OSA on CPAP    setting is unknown   Oxygen deficiency    PAF (paroxysmal  atrial fibrillation) (HCC) 03/2021   Event Monitor Aug-Sept 2022: Predominantly sinus rhythm.  1% A. fib burden.  30 brief episodes of PAT.  2 short bursts of NSVT.   PAT (paroxysmal atrial tachycardia) (HCC)    Event monitor from August 2022 showed 30 brief bursts longest 14 seconds.   Pneumonia    "several times" (07/03/2017)   PVC's (premature ventricular contractions)    Rheumatoid arthritis (HCC)    Sickle cell anemia (HCC)    Sleep apnea    Stroke (HCC)    Syncope 07/03/2017 X 2   called seizures but no medications   Thalassemia    "my cells are sickle cell shaped but I don't have sickle cell anemia" (07/03/2017)   Type II diabetes mellitus (HCC)     Past Surgical History:  Procedure Laterality Date   48-Hour Monitor  03/18/2017   Sinus rhythm with sinus tachycardia (rate 58-134 BPM)multiple PVCs noted with couplets and bigeminy. One triplet. 7 runs of PAT ranging from 100-130 bpm. Longest was 33 beats.   BIOPSY  04/03/2021   Procedure: BIOPSY;  Surgeon: Benancio Deeds, MD;  Location: [facility];  Service: Gastroenterology;;   BIOPSY  12/17/2022   Procedure: BIOPSY;  Surgeon: Hilarie Fredrickson, MD;  Location: [facility];  Service: Gastroenterology;;   BREAST BIOPSY Left    BRONCHIAL WASHINGS  03/28/2021   Procedure:  BRONCHIAL WASHINGS;  Surgeon: Steffanie Dunn, DO;  Location: [facility] ENDOSCOPY;  Service: Endoscopy;;   CARDIAC CATHETERIZATION N/A 11/19/2016   Procedure: Left Heart Cath and Coronary Angiography;  Surgeon: Lyn Records, MD;  Location: [facility] INVASIVE CV LAB;  Service: Cardiovascular.    LM nl, LAD 40p, 50md, D1/2 small, LCX 40ost, OM2/3 nl/small, RCA 35 ost/mid, RPDA/RPL small/nl.   CARDIAC MRI  03/30/2021   Normal LVEF ~53%. 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 No Scar or Late Gadolinium Enhancement on LV Myocardium.   CARPAL TUNNEL RELEASE Right 11/01/2014   COLONOSCOPY     DILATION AND CURETTAGE OF UTERUS     ECTOPIC  PREGNANCY SURGERY  X 2   ENTEROSCOPY N/A 09/05/2022   Procedure: ENTEROSCOPY;  Surgeon: Meridee Score Netty Starring., MD;  Location: [facility];  Service: Gastroenterology;  Laterality: N/A;   ESOPHAGOGASTRODUODENOSCOPY N/A 12/17/2022   Procedure: ESOPHAGOGASTRODUODENOSCOPY (EGD);  Surgeon: Hilarie Fredrickson, MD;  Location: [facility];  Service: Gastroenterology;  Laterality: N/A;   ESOPHAGOGASTRODUODENOSCOPY (EGD) WITH PROPOFOL N/A 04/03/2021   Procedure: ESOPHAGOGASTRODUODENOSCOPY (EGD) WITH PROPOFOL;  Surgeon: Benancio Deeds, MD;  Location: [facility] ENDOSCOPY;  Service: Gastroenterology;  Laterality: N/A;   ESOPHAGOGASTRODUODENOSCOPY (EGD) WITH PROPOFOL N/A 08/15/2021   Procedure: ESOPHAGOGASTRODUODENOSCOPY (EGD) WITH PROPOFOL;  Surgeon: Meryl Dare, MD;  Location: [facility] ENDOSCOPY;  Service: Endoscopy;  Laterality: N/A;   HOT HEMOSTASIS N/A 09/05/2022   Procedure: HOT HEMOSTASIS (ARGON PLASMA COAGULATION/BICAP);  Surgeon: Lemar Lofty., MD;  Location: [facility];  Service: Gastroenterology;  Laterality: N/A;   JOINT REPLACEMENT     KNEE ARTHROSCOPY Left 11/2014   RIGHT/LEFT HEART CATH AND CORONARY ANGIOGRAPHY N/A 08/08/2021   Procedure: RIGHT/LEFT HEART CATH AND CORONARY ANGIOGRAPHY;  Surgeon: Marykay Lex, MD;  Location: [facility] INVASIVE CV LAB;  Service: Cardiovascular;Ost LCx 40%. Ost-Prox RCA 35%. Prox-Mid LAD 25%-<25% D2. Mod-Severely Elevated LVEDP. Mild PA HTN -> PA mean 31 mmHg with a PCWP and LVEDP of 23 mmHg   SUBMUCOSAL TATTOO INJECTION  09/05/2022   Procedure: SUBMUCOSAL TATTOO INJECTION;  Surgeon: Lemar Lofty., MD;  Location: [facility] END[facility];  Service: Gastroenterology;;   TEE WITHOUT CARDIOVERSION N/A 03/28/2021   Procedure: TRANSESOPHAGEAL ECHOCARDIOGRAM (TEE);  Surgeon: Vesta Mixer, MD;  Location: [facility] ENDOSCOPY;; LVEF 55-60%. Normal LV Fxn & no RWMA. No LAA thrombus. Gr II Ao Plaque. Large circumscribed calcific mass (1.8 x 1.4 cm) anterior to posterior  annulus.  Fixed posterior leaflet with Mod-Severe MS (Mean MVG ~14 mmHg, Peak 20 mmhg).  Mild MR   TONSILLECTOMY     TOTAL KNEE ARTHROPLASTY Right 02/18/2015   Procedure: RIGHT TOTAL KNEE ARTHROPLASTY;  Surgeon: Beverely Low, MD;  Location: [facility] OR;  Service: Orthopedics;  Laterality: Right;   TOTAL KNEE ARTHROPLASTY Left 08/19/2015   Procedure: LEFT TOTAL KNEE ARTHROPLASTY;  Surgeon: Beverely Low, MD;  Location: [facility] OR;  Service: Orthopedics;  Laterality: Left;   TRANSTHORACIC ECHOCARDIOGRAM  11/2016; 07/2017:   a) normal LV size, thickness and function. EF 55-60%. GR 1 DD.No RWMA severe mitral annular calcification, but no mitral stenosis. Mild LA dilation;; b) normal LV size and function.  EF 65-75%.  Unable to assess diastolic function.  Aortic sclerosis with no stenosis.  Moderate mitral stenosis? (Gradient 9 mmHg) -?  Mild-mod left atrial enlargement    TRANSTHORACIC ECHOCARDIOGRAM  01/2019   EF 65 to 70%.  Normal function.  Elevated LVEDP-GR 1 DD.  Normal RV size and function.  Large calcific mass on  posterior mitral leaflet mild to moderate mitral stenosis.   TRANSTHORACIC ECHOCARDIOGRAM  03/24/2021   EF 60 to 65%.  LV function with normal wall motion.  Moderate basal septal LVH.  GRII DD & Mild LA dilation.-> elevated LVEDP.  Normal RV function.  Mildly elevated PAP.  Ill-defined density measuring 3.23 x 2.1 cm region of the posterior MV leaflet along with dense MAC.  Moderate MS (mean MVG of 9 mmHg.) Normal AoV & RAP. --> Recommend TEE 2/2 concern for MV SBE.   TRANSTHORACIC ECHOCARDIOGRAM  04/26/2022   EF 60 to 65%.  No RWMA.  Moderate LVH with GR 1 DD.  Normal RV size and function.  Mild LA dilation.  Large calcific mass of posterior MV leaflet.  Mean PG 13 mm 3.  Trivial MR.  Mild to moderate MS.  Severe MAC.  Normal AOV.  Normal RAP.   VAGINAL HYSTERECTOMY     VIDEO BRONCHOSCOPY N/A 03/28/2021   Procedure: VIDEO BRONCHOSCOPY WITHOUT FLUORO;  Surgeon: Steffanie Dunn, DO;  Location: [facility]  ENDOSCOPY;  Service: Endoscopy;  Laterality: N/A;    Social History Jearlean Demauro  reports that she quit smoking about 8 years ago. Her smoking use included cigarettes. She started smoking about 52 years ago. She has a 11 pack-year smoking history. She has never used smokeless tobacco. She reports that she does not currently use alcohol. She reports that she does not use drugs.  family history includes Cancer in her father and mother; Diabetes in her maternal grandfather, maternal grandmother, mother, and sister; Hyperlipidemia in her father and mother; Hypertension in her mother; Mental illness in her sister.  Allergies  Allergen Reactions   Methotrexate Other (See Comments)    Pneumonitis   Penicillins Hives    Childhood allergy Has patient had a PCN reaction causing immediate rash, facial/tongue/throat swelling, SOB or lightheadedness with hypotension: Yes Has patient had a PCN reaction causing severe rash involving mucus membranes or skin necrosis: No Has patient had a PCN reaction that required hospitalization No Has patient had a PCN reaction occurring within the last 10 years: No If all of the above answers are "NO", then may proceed with Cephalosporin use.   Marcelline Deist [Dapagliflozin] Other (See Comments)    Dizzy and lethargic    Metformin And Related Diarrhea and Other (See Comments)    Also, bleeding       PHYSICAL EXAMINATION: Vital signs: BP 117/78 (BP Location: Left Arm)   Pulse 86   Temp 97.9 F (36.6 C) (Oral)   Resp 20   Ht 5\' 3"  (1.6 m)   Wt 107.5 kg   LMP  (LMP Unknown)   SpO2 100%   BMI 41.98 kg/m   Constitutional: Chronically ill-appearing, generally well-appearing, no acute distress Psychiatric: alert and oriented x3, cooperative Eyes: extraocular movements intact, anicteric, conjunctiva pink Mouth: oral pharynx moist, no lesions Neck: supple no lymphadenopathy Cardiovascular: heart regular rate and rhythm, no murmur Lungs: clear to  auscultation bilaterally with an occasional end expiratory wheeze Abdomen: soft, obese, nontender, nondistended, no obvious ascites, no peritoneal signs, normal bowel sounds, no organomegaly Rectal: Omitted Extremities: no clubbing or cyanosis.  1+ lower extremity edema bilaterally Skin: no lesions on visible extremities Neuro: No focal deficits.  Cranial nerves intact  ASSESSMENT:  1.  Small bowel bleeding.  Suspect AVM.  Lesion beyond the reach of the standard enteroscope.  No further bleeding off anticoagulation.  Hemoglobin stable 2.  Acute respiratory problems.  Resolved.   PLAN:  1.  Continue to monitor for evidence of recurrent bleeding.  Holding anticoagulation for now. 2.  Diet of choice 3.  We will see the patient Monday morning.  We will see if there is opportunity to perform deep enteroscopy while she is here as an inpatient.  If not, other arrangements either elsewhere or outpatient.  All to be determined on how she does clinically over the next few days.  Wilhemina Bonito. Eda Keys., M.D. [facility] Division of Gastroenterology

## 2023-07-20 NOTE — Plan of Care (Signed)
  Problem: Education: Goal: Knowledge of General Education information will improve Description: Including pain rating scale, medication(s)/side effects and non-pharmacologic comfort measures Outcome: Progressing   Problem: Clinical Measurements: Goal: Ability to maintain clinical measurements within normal limits will improve Outcome: Progressing Goal: Will remain free from infection Outcome: Progressing   Problem: Nutrition: Goal: Adequate nutrition will be maintained Outcome: Progressing   Problem: Pain Managment: Goal: General experience of comfort will improve Outcome: Progressing   Problem: Safety: Goal: Ability to remain free from injury will improve Outcome: Progressing   Problem: Fluid Volume: Goal: Ability to maintain a balanced intake and output will improve Outcome: Progressing

## 2023-07-20 NOTE — Progress Notes (Signed)
Progress Note   Patient: Lynn [name] JXB:[id number] DOB: June 24, 1956 DOA: 07/15/2023     5 DOS: the patient was seen and examined on 07/20/2023   Brief hospital course: 67yo with h/o recurrent GI bleeding from gastric ulcer/AVMs, HTN, HLD, chronic diastolic CHF, COPD, CAD, CVA, OSA, DM, and morbid obesity who presented on 9/30 with tarry stools, Hgb 7.1.  She was started on IV Protonix and GI was consulted.  Assessment and Plan: Subacute upper GI bleeding -Patient with h/o gastric ulcer and AVMs presenting with melena, suggestive of upper GI bleeding. -GI consulted. Pt now s/p EGD 10/2 which was found to be normal. Capsule was placed. Per GI, found to have active SB bleeding into study.  -Lesion noted to be beyond the reach of the standard endoscope. -Hgb remains stable -Cont to monitor for now, recheck CBC   ABLA -Patient's lightheadedness and fatigue are most likely caused by anemia secondary to upper GI bleeding.  -s/p 3 units PRBC's thus far -hgb thus far stable   Essential hypertension/CAD -Continue diltiazem, Imdur, losartan   Dyslipidemia -Continue atorvastatin   Rheumatoid arthritis (HCC) -Continue Plaquenil, Arva   Type 2 diabetes mellitus without complications (HCC) -She wears an Omnipod but reports no need for glargine since being on Mounjaro -Pior A1c was 6.9 in July, good coverage -Contiue sensitive-scale SSI for now   Anxiety -Continue alprazolam   COPD -Wears 3-4L home O2, on home O2 at this time -Continue Albuterol as needed. -Pt reports using q6h nebs at home. Have continued on Q6h duonebs here   Afib -Hold Eliquis in the setting of GI bleed -Rate controlled with diltiazem   Morbid obesity -Has been on Mounjaro with improvement -Body mass index is 41.79 kg/m..  -Ongoing weight loss should be encouraged -Outpatient PCP/bariatric medicine/bariatric surgery f/u encouraged   Acutely decompensated diastolic CHF exacerbation -Overnight  events noted. Concerns of worsening sob and acute CHF exacerbation in the setting of receiving 3 units PRBC's this visit -Good improvement with 40mg  po lasix, will continue for now -follow I/o and daily wts -recheck bmet in AM   Subjective: Increased sob overnight, needing lasix  Physical Exam: Vitals:   07/20/23 0506 07/20/23 0820 07/20/23 0900 07/20/23 1240  BP: (!) 113/56  133/85 117/78  Pulse: 87  88 86  Resp: 20  20 20   Temp: 98.4 F (36.9 C)  98.3 F (36.8 C) 97.9 F (36.6 C)  TempSrc: Oral  Oral Oral  SpO2: 98% 97% 98% 100%  Weight:  42 kg    Height:       General exam: Conversant, in no acute distress Respiratory system: normal chest rise, clear, no audible wheezing Cardiovascular system: regular rhythm, s1-s2 Gastrointestinal system: Nondistended, nontender, pos BS Central nervous system: No seizures, no tremors Extremities: No cyanosis, no joint deformities Skin: No rashes, no pallor Psychiatry: Affect normal // no auditory hallucinations   Data Reviewed:  Labs reviewed: Na 139, K 3.6, Cr 1.02, WBC 9.1, Hgb 9.0, Plts 236  Family Communication: Pt in room, family not at bedside  Disposition: Status is: Inpatient Remains inpatient appropriate because: severity of illness  Planned Discharge Destination: Home    Author: Rickey Barbara, MD 07/20/2023 5:25 PM  For on call review [email or web address].

## 2023-07-21 DIAGNOSIS — D649 Anemia, unspecified: Secondary | ICD-10-CM | POA: Diagnosis not present

## 2023-07-21 DIAGNOSIS — K921 Melena: Secondary | ICD-10-CM | POA: Diagnosis not present

## 2023-07-21 LAB — GLUCOSE, CAPILLARY
Glucose-Capillary: 133 mg/dL — ABNORMAL HIGH (ref 70–99)
Glucose-Capillary: 184 mg/dL — ABNORMAL HIGH (ref 70–99)
Glucose-Capillary: 197 mg/dL — ABNORMAL HIGH (ref 70–99)
Glucose-Capillary: 200 mg/dL — ABNORMAL HIGH (ref 70–99)

## 2023-07-21 LAB — COMPREHENSIVE METABOLIC PANEL
ALT: 27 U/L (ref 0–44)
AST: 18 U/L (ref 15–41)
Albumin: 3.2 g/dL — ABNORMAL LOW (ref 3.5–5.0)
Alkaline Phosphatase: 70 U/L (ref 38–126)
Anion gap: 12 (ref 5–15)
BUN: 11 mg/dL (ref 8–23)
CO2: 30 mmol/L (ref 22–32)
Calcium: 8.6 mg/dL — ABNORMAL LOW (ref 8.9–10.3)
Chloride: 97 mmol/L — ABNORMAL LOW (ref 98–111)
Creatinine, Ser: 1.02 mg/dL — ABNORMAL HIGH (ref 0.44–1.00)
GFR, Estimated: 60 mL/min — ABNORMAL LOW (ref >60)
Glucose, Bld: 141 mg/dL — ABNORMAL HIGH (ref 70–99)
Potassium: 3.2 mmol/L — ABNORMAL LOW (ref 3.5–5.1)
Sodium: 139 mmol/L (ref 135–145)
Total Bilirubin: 0.4 mg/dL (ref 0.3–1.2)
Total Protein: 6.8 g/dL (ref 6.5–8.1)

## 2023-07-21 LAB — CBC
HCT: 27.3 % — ABNORMAL LOW (ref 36.0–46.0)
Hemoglobin: 8.3 g/dL — ABNORMAL LOW (ref 12.0–15.0)
MCH: 21.7 pg — ABNORMAL LOW (ref 26.0–34.0)
MCHC: 30.4 g/dL (ref 30.0–36.0)
MCV: 71.5 fL — ABNORMAL LOW (ref 80.0–100.0)
Platelets: 212 10*3/uL (ref 150–400)
RBC: 3.82 MIL/uL — ABNORMAL LOW (ref 3.87–5.11)
RDW: 25.2 % — ABNORMAL HIGH (ref 11.5–15.5)
WBC: 7.3 10*3/uL (ref 4.0–10.5)
nRBC: 0 % (ref 0.0–0.2)

## 2023-07-21 NOTE — Plan of Care (Signed)

## 2023-07-21 NOTE — Progress Notes (Signed)
Mobility Specialist - Progress Note   07/21/23 1248  Mobility  Activity Ambulated with assistance in hallway  Level of Assistance Standby assist, set-up cues, supervision of patient - no hands on  Assistive Device Front wheel walker  Distance Ambulated (ft) 250 ft  Range of Motion/Exercises Active  Activity Response Tolerated well  Mobility Referral Yes  $Mobility charge 1 Mobility  Mobility Specialist Start Time (ACUTE ONLY) 1230  Mobility Specialist Stop Time (ACUTE ONLY) 1243  Mobility Specialist Time Calculation (min) (ACUTE ONLY) 13 min   Pt received in chair and agreed to mobility, had no issues throughout session and returned to chair with all needs met.  Marilynne Halsted Mobility Specialist

## 2023-07-21 NOTE — Progress Notes (Addendum)
Progress Note   Patient: Lynn [name] ZOX:[id number] DOB: Sep 17, 1956 DOA: 07/15/2023     6 DOS: the patient was seen and examined on 07/21/2023   Brief hospital course: 67yo with h/o recurrent GI bleeding from gastric ulcer/AVMs, HTN, HLD, chronic diastolic CHF, COPD, CAD, CVA, OSA, DM, and morbid obesity who presented on 9/30 with tarry stools, Hgb 7.1.  She was started on IV Protonix and GI was consulted.  Assessment and Plan: Subacute upper GI bleeding -Patient with h/o gastric ulcer and AVMs presenting with melena, suggestive of upper GI bleeding. -GI consulted. Pt now s/p EGD 10/2 which was found to be normal. Capsule was placed. Per GI, found to have active SB bleeding into study.  -Lesion noted to be beyond the reach of the standard endoscope. -Hgb currently 8.3 -Cont to monitor for now, recheck CBC -F/u with GI recs   ABLA -Patient's lightheadedness and fatigue are most likely caused by anemia secondary to upper GI bleeding.  -s/p 3 units PRBC's thus far -hgb thus far stable   Essential hypertension/CAD -Continue diltiazem, Imdur, losartan   Dyslipidemia -Continue atorvastatin   Rheumatoid arthritis (HCC) -Continue Plaquenil, Arva   Type 2 diabetes mellitus without complications (HCC) -Pior A1c was 6.9 in July, good coverage -Contiue sensitive-scale SSI for now -Receive dose of scheduled Mounjaro today   Anxiety -Continue alprazolam   COPD -Wears 3-4L home O2, on home O2 at this time -Continue Albuterol as needed. -Pt reports using q6h nebs at home. Have continued on Q6h duonebs here   Afib -Hold Eliquis in the setting of GI bleed -Rate controlled with diltiazem   Morbid obesity -Has been on Mounjaro with improvement -Body mass index is 41.79 kg/m..  -Ongoing weight loss should be encouraged -Outpatient PCP/bariatric medicine/bariatric surgery f/u encouraged   Acutely decompensated diastolic CHF exacerbation -Overnight events noted.  Concerns of worsening sob and acute CHF exacerbation in the setting of receiving 3 units PRBC's this visit -Good improvement with 40mg  po lasix, will continue for now -follow I/o and daily wts -recheck bmet in AM  Hypokalemia -Replaced   Subjective: States voiding well. States feeling better  Physical Exam: Vitals:   07/21/23 0848 07/21/23 1014 07/21/23 1426 07/21/23 1516  BP:  132/70 109/60   Pulse:  81 73   Resp:  20 20   Temp:  98.7 F (37.1 C) 98.9 F (37.2 C)   TempSrc:  Oral Oral   SpO2:  100% 100% 94%  Weight: 108.2 kg     Height:       General exam: Awake, laying in bed, in nad Respiratory system: Normal respiratory effort, no wheezing Cardiovascular system: regular rate, s1, s2 Gastrointestinal system: Soft, nondistended, positive BS Central nervous system: CN2-12 grossly intact, strength intact Extremities: Perfused, no clubbing Skin: Normal skin turgor, no notable skin lesions seen Psychiatry: Mood normal // no visual hallucinations   Data Reviewed:  Labs reviewed: Na 139, K 3.2, Cr 1.02, WBC 7.3, Hgb 8.3, Plts 212  Family Communication: Pt in room, family at bedside  Disposition: Status is: Inpatient Remains inpatient appropriate because: severity of illness  Planned Discharge Destination: Home    Author: Rickey Barbara, MD 07/21/2023 5:29 PM  For on call review [email or web address].

## 2023-07-22 ENCOUNTER — Encounter (HOSPITAL_COMMUNITY): Payer: Self-pay | Admitting: Internal Medicine

## 2023-07-22 ENCOUNTER — Encounter: Payer: Self-pay | Admitting: Family Medicine

## 2023-07-22 DIAGNOSIS — R195 Other fecal abnormalities: Secondary | ICD-10-CM | POA: Diagnosis not present

## 2023-07-22 DIAGNOSIS — K5521 Angiodysplasia of colon with hemorrhage: Secondary | ICD-10-CM | POA: Diagnosis not present

## 2023-07-22 DIAGNOSIS — D62 Acute posthemorrhagic anemia: Secondary | ICD-10-CM | POA: Diagnosis not present

## 2023-07-22 DIAGNOSIS — D649 Anemia, unspecified: Secondary | ICD-10-CM | POA: Diagnosis not present

## 2023-07-22 DIAGNOSIS — K921 Melena: Secondary | ICD-10-CM | POA: Diagnosis not present

## 2023-07-22 LAB — GLUCOSE, CAPILLARY
Glucose-Capillary: 142 mg/dL — ABNORMAL HIGH (ref 70–99)
Glucose-Capillary: 150 mg/dL — ABNORMAL HIGH (ref 70–99)
Glucose-Capillary: 144 mg/dL — ABNORMAL HIGH (ref 70–99)
Glucose-Capillary: 175 mg/dL — ABNORMAL HIGH (ref 70–99)

## 2023-07-22 LAB — COMPREHENSIVE METABOLIC PANEL
ALT: 26 U/L (ref 0–44)
AST: 16 U/L (ref 15–41)
Albumin: 3.6 g/dL (ref 3.5–5.0)
Alkaline Phosphatase: 70 U/L (ref 38–126)
Anion gap: 12 (ref 5–15)
BUN: 11 mg/dL (ref 8–23)
CO2: 29 mmol/L (ref 22–32)
Calcium: 9.2 mg/dL (ref 8.9–10.3)
Chloride: 99 mmol/L (ref 98–111)
Creatinine, Ser: 0.98 mg/dL (ref 0.44–1.00)
GFR, Estimated: >60 mL/min (ref >60)
Glucose, Bld: 125 mg/dL — ABNORMAL HIGH (ref 70–99)
Potassium: 3.4 mmol/L — ABNORMAL LOW (ref 3.5–5.1)
Sodium: 140 mmol/L (ref 135–145)
Total Bilirubin: 0.5 mg/dL (ref 0.3–1.2)
Total Protein: 7.3 g/dL (ref 6.5–8.1)

## 2023-07-22 LAB — CBC
HCT: 28.5 % — ABNORMAL LOW (ref 36.0–46.0)
Hemoglobin: 8.4 g/dL — ABNORMAL LOW (ref 12.0–15.0)
MCH: 21.2 pg — ABNORMAL LOW (ref 26.0–34.0)
MCHC: 29.5 g/dL — ABNORMAL LOW (ref 30.0–36.0)
MCV: 71.8 fL — ABNORMAL LOW (ref 80.0–100.0)
Platelets: 234 10*3/uL (ref 150–400)
RBC: 3.97 MIL/uL (ref 3.87–5.11)
RDW: 25 % — ABNORMAL HIGH (ref 11.5–15.5)
WBC: 6.1 10*3/uL (ref 4.0–10.5)
nRBC: 0 % (ref 0.0–0.2)

## 2023-07-22 MED ORDER — POTASSIUM CHLORIDE CRYS ER 20 MEQ PO TBCR
60.0000 meq | EXTENDED_RELEASE_TABLET | Freq: Once | ORAL | Status: AC
  Administered 2023-07-22: 60 meq via ORAL
  Filled 2023-07-22: qty 3

## 2023-07-22 MED ORDER — CYCLOBENZAPRINE HCL 5 MG PO TABS
5.0000 mg | ORAL_TABLET | Freq: Three times a day (TID) | ORAL | Status: DC | PRN
  Administered 2023-07-22 – 2023-07-23 (×3): 5 mg via ORAL
  Filled 2023-07-22 (×3): qty 1

## 2023-07-22 MED ORDER — APIXABAN 5 MG PO TABS
5.0000 mg | ORAL_TABLET | Freq: Two times a day (BID) | ORAL | Status: DC
  Administered 2023-07-22 – 2023-07-24 (×4): 5 mg via ORAL
  Filled 2023-07-22 (×4): qty 1

## 2023-07-22 MED ORDER — AYR SALINE NASAL NA GEL
1.0000 | NASAL | Status: DC | PRN
  Administered 2023-07-23: 1 via NASAL
  Filled 2023-07-22: qty 14.1

## 2023-07-22 NOTE — Progress Notes (Signed)
Mobility Specialist - Progress Note   07/22/23 1321  Oxygen Therapy  SpO2 92 %  O2 Device Nasal Cannula  Patient Activity (if Appropriate) Ambulating  Mobility  Activity Ambulated with assistance in hallway  Level of Assistance Modified independent, requires aide device or extra time  Assistive Device Front wheel walker  Distance Ambulated (ft) 440 ft  Activity Response Tolerated well  Mobility Referral Yes  $Mobility charge 1 Mobility  Mobility Specialist Start Time (ACUTE ONLY) 0108  Mobility Specialist Stop Time (ACUTE ONLY) 0121  Mobility Specialist Time Calculation (min) (ACUTE ONLY) 13 min   Pt received in recliner and agreeable to mobility. No complaints during session. Pt to recliner after session with all needs met.    Pre-mobility: 93% SpO2 During mobility: 92% SpO2 Post-mobility: 96% SPO2  [employer]

## 2023-07-22 NOTE — Progress Notes (Signed)
Progress Note   Patient: Lynn [name] ZOX:[id number] DOB: [birth year] DOA: 07/15/2023     7 DOS: the patient was seen and examined on 07/22/2023   Brief hospital course: 67yo with h/o recurrent GI bleeding from gastric ulcer/AVMs, HTN, HLD, chronic diastolic CHF, COPD, CAD, CVA, OSA, DM, and morbid obesity who presented on 9/30 with tarry stools, Hgb 7.1.  She was started on IV Protonix and GI was consulted.  Assessment and Plan: Subacute upper GI bleeding -Patient with h/o gastric ulcer and AVMs presenting with melena, suggestive of upper GI bleeding. -GI consulted. Pt now s/p EGD 10/2 which was found to be normal. Capsule was placed. Per GI, found to have active SB bleeding into study.  -Lesion noted to be beyond the reach of the standard endoscope. -Hgb thus far stable off anticoagulation -discussed with GI. Would need enteroscopy, however unable to do here in the near future for logistical reasons. GI discussed with [facility] who recommended trial of resuming eliquis here, and if pt has recurrent bleed, then would pursue transfer to [facility] for further GI work up   [employer] -Patient's lightheadedness and fatigue are most likely caused by anemia secondary to upper GI bleeding.  -s/p 3 units PRBC's thus far -hgb thus far stable   Essential hypertension/CAD -Continue diltiazem, Imdur, losartan   Dyslipidemia -Continue atorvastatin   Rheumatoid arthritis (HCC) -Continue Plaquenil, Arva   Type 2 diabetes mellitus without complications (HCC) -Pior A1c was 6.9 in July, good coverage -Contiue sensitive-scale SSI for now -given dose of scheduled Mounjaro 10/6   Anxiety -Continue alprazolam   COPD -Wears 3-4L home O2, on home O2 at this time -Continue Albuterol as needed. -Pt reports using q6h nebs at home. Have continued on Q6h duonebs here   Afib with hx CVA -Initially held Eliquis in the setting of GI bleed -Rate controlled with diltiazem -Resumed eliquis today   Morbid  obesity -Has been on Mounjaro with improvement -Body mass index is 41.79 kg/m..  -Ongoing weight loss should be encouraged -Outpatient PCP/bariatric medicine/bariatric surgery f/u encouraged   Acutely decompensated diastolic CHF exacerbation -Overnight events noted. Concerns of worsening sob and acute CHF exacerbation in the setting of receiving 3 units PRBC's this visit -Good improvement with 40mg  po lasix, will continue for now -follow I/o and daily wts -recheck bmet in AM  Hypokalemia -Replaced   Subjective: Complained of muscle soreness this AM, much improved with flexeril  Physical Exam: Vitals:   07/22/23 0824 07/22/23 0826 07/22/23 1321 07/22/23 1428  BP:    (!) 160/83  Pulse:    77  Resp:    15  Temp:    98.4 F (36.9 C)  TempSrc:    Oral  SpO2: 99% 99% 92% 100%  Weight:      Height:       General exam: Awake, laying in bed, in nad Respiratory system: Normal respiratory effort, no wheezing Cardiovascular system: regular rate, s1, s2 Gastrointestinal system: Soft, nondistended, positive BS Central nervous system: CN2-12 grossly intact, strength intact Extremities: Perfused, no clubbing Skin: Normal skin turgor, no notable skin lesions seen Psychiatry: Mood normal // no visual hallucinations   Data Reviewed:  Labs reviewed: Na 140, K 3.4, Cr 0.98, WBC 6.1, Hgb 8.4  Family Communication: Pt in room, family at bedside  Disposition: Status is: Inpatient Remains inpatient appropriate because: severity of illness  Planned Discharge Destination: Home    Author: Rickey Barbara, MD 07/22/2023 5:23 PM  For on call review [email or web address].

## 2023-07-22 NOTE — Plan of Care (Signed)

## 2023-07-22 NOTE — Progress Notes (Addendum)
Progress Note  Primary GI: Dr. Russella Dar  LOS: 7 days   Chief Complaint: melena, anemia in setting of Eliquis   Subjective  Patient sitting up in recliner.  Reports she is not feeling well this morning due to muscle tightness in neck and back and legs.  Currently on room air due to nosebleeds.  Reports she has some shortness of breath with walking.  No chest pain or dizziness.  Patient's last bowel movement was yesterday brown with no blood in stool.  Tolerating regular diet.  No nausea or vomiting.  No abdominal pain. Reports she has been wearing ted socks due to bilateral leg swelling.  No family was present at the time of my evaluation.   Objective   Vital signs in last 24 hours: Temp:  [97.6 F (36.4 C)-98.9 F (37.2 C)] 97.6 F (36.4 C) (10/07 0506) Pulse Rate:  [69-81] 69 (10/07 0506) Resp:  [20] 20 (10/06 1426) BP: (109-141)/(60-71) 127/71 (10/07 0506) SpO2:  [94 %-100 %] 99 % (10/07 0826) Weight:  [108 kg-108.2 kg] 108 kg (10/07 0506) Last BM Date : 07/20/23 (Pt reports 10/5 RN didn't see) Last BM recorded by nurses in past 5 days Stool Type: Type 5 (Soft blobs with clear-cut edges) (07/18/2023  2:03 AM)  General:   female in no acute distress  Heart:  Regular rate and rhythm; no murmurs Pulm: Clear anteriorly; no wheezing, on RA Abdomen: soft,obese, nondistended, normal bowel sounds in all quadrants. Nontender without guarding. No organomegaly appreciated. Extremities:  trace bilateral lower extremity edema Neurologic:  Alert and  oriented x4;  No focal deficits.  Psych:  Cooperative. Normal mood and affect.  Intake/Output from previous day: 10/06 0701 - 10/07 0700 In: 840 [P.O.:840] Out: 452 [Urine:452] Intake/Output this shift: No intake/output data recorded.  Studies/Results: No results found.  Lab Results: Recent Labs    07/20/23 0811 07/21/23 0538 07/22/23 0520  WBC 9.1 7.3 6.1  HGB 9.0* 8.3* 8.4*  HCT 30.2* 27.3* 28.5*  PLT 236 212 234    BMET Recent Labs    07/20/23 0811 07/21/23 0538 07/22/23 0520  NA 139 139 140  K 3.6 3.2* 3.4*  CL 96* 97* 99  CO2 30 30 29   GLUCOSE 156* 141* 125*  BUN 8 11 11   CREATININE 1.02* 1.02* 0.98  CALCIUM 9.1 8.6* 9.2   LFT Recent Labs    07/22/23 0520  PROT 7.3  ALBUMIN 3.6  AST 16  ALT 26  ALKPHOS 70  BILITOT 0.5   PT/INR No results for input(s): "LABPROT", "INR" in the last 72 hours.   Scheduled Meds:  atorvastatin  80 mg Oral Daily   diltiazem  360 mg Oral Daily   folic acid  1 mg Oral Daily   furosemide  40 mg Oral Daily   hydroxychloroquine  200 mg Oral BID   insulin aspart  0-5 Units Subcutaneous QHS   insulin aspart  0-9 Units Subcutaneous TID WC   ipratropium-albuterol  3 mL Nebulization TID   isosorbide mononitrate  30 mg Oral Daily   leflunomide  20 mg Oral Daily   losartan  50 mg Oral Daily   melatonin  10 mg Oral QHS   pantoprazole (PROTONIX) IV  40 mg Intravenous Q12H   potassium chloride  60 mEq Oral Once   traZODone  50 mg Oral QHS   Continuous Infusions:  sodium chloride       Patient Narrative:  67 year old African-American female with history of recurrent anemia and  GI bleeding in setting of chronic Eliquis for atrial fibrillation. Admitted with 1 week history of melena and complaints of fatigue. Hemoglobin was 8 on 07/12/2023, repeat on admit 7.3.   Impression/Plan:   Recurrent anemia acute on chronic, s/p GI bleed on chronic Eliquis for atrial fibrillation. Pt underwent (10/2) small bowel enteroscopy and capsule endoscopy with evidence of bleeding in small bowel beyond reach of the endoscope. Plans for enteroscopy cancelled last week d/t CHF and resp distress. Feeling better after Lasix 40mg  po daily. Received 3 units PRBCs since admission. Currently on RA due to nose bleeds.  Hemoglobin 9>8.3>8.4. chronically low MCV 71.8 secondary thalassemia BUN 11/Creat 0.98 -Continue Pantoprazole 40mg  po daily -Monitor H/H - Spoke with [facility] since  patient's not actively bleeding at this time and stable Hgb with no melena for 3 days, they are recommending patient go back on Eliquis inpatient and if bleeding reoccurs we can transfer her to have balloon enteroscopy. However if patient remains stable we can do outpatient balloon enteroscopy with supportive care.  Atrial fibrillation (on Eliquis). Last dose 9/30 am. -Recommend restarting Eliquis and monitoring inpatient for reoccurrence of bleeding.  Hypokalemia.  Potassium 3.4 -replete per protocol  Interstitial lung disease/COPD on chronic oxygen 3 L at home  Mitral stenosis- Last echo earlier this year EF 60 to 65% moderate mitral stenosis no AS.   Principal Problem:   Occult GI bleeding Active Problems:   Essential hypertension   Anxiety   Rheumatoid arthritis (HCC)   PAF (paroxysmal atrial fibrillation) (HCC); CHA2DS2-VASc score 7   ABLA (acute blood loss anemia)   Dyslipidemia   Type 2 diabetes mellitus without complications (HCC)   COPD (chronic obstructive pulmonary disease) (HCC)   Obesity, Class III, BMI 40-49.9 (morbid obesity) (HCC)   AVM (arteriovenous malformation) of small bowel, acquired with hemorrhage   Lynn [name]  07/22/2023, 8:39 AM    Attending Physician Note   I have taken an interval history, reviewed the chart and examined the patient. I performed a substantive portion of this encounter, including complete performance of at least one of the key components, in conjunction with the APP. I agree with the APP's note, impression and recommendations with my edits. My additional impressions and recommendations are as follows.   Recurrent GI bleeding, known AVMs, suspected SB AVM bleed on VCE at 50 minutes, beyond reach of push enteroscopy. Discussed with [facility] today. Plan to resume Eliquis in hospital today and if bleeding recurs then inpatient transfer to [facility] for deep enteroscopy. If no bleeding then outpatient deep enteroscopy will be scheduled.    Decompensated diastolic CHF exacerbation, improved PAF, resuming Eliquis  ABLA  COPD on home O2 Essential hypertension Anxiety Rheumatoid arthritis  Diabetes mellitus  Obesity, Class III, BMI 40-49.9   Claudette Head, MD [facility] See Loretha Stapler, [facility], for our on call provider

## 2023-07-22 NOTE — Progress Notes (Signed)
Brief Pharmacy Note Regarding Anticoagulation  67 year old female on Eliquis for atrial fibrillation and history of CVA. Eliquis on hold since 9/30 for GIB, pt required transfusion while admitted. Plan is now to resume Eliquis and monitor for recurrent bleeding. Patient on Eliquis 5 mg BID at home - resume at same dose.  Pricilla Riffle, PharmD, BCPS Clinical Pharmacist 07/22/2023 3:46 PM

## 2023-07-23 ENCOUNTER — Telehealth: Payer: Self-pay | Admitting: *Deleted

## 2023-07-23 ENCOUNTER — Other Ambulatory Visit: Payer: Self-pay | Admitting: *Deleted

## 2023-07-23 DIAGNOSIS — K921 Melena: Secondary | ICD-10-CM | POA: Diagnosis not present

## 2023-07-23 DIAGNOSIS — K922 Gastrointestinal hemorrhage, unspecified: Secondary | ICD-10-CM

## 2023-07-23 DIAGNOSIS — R195 Other fecal abnormalities: Secondary | ICD-10-CM | POA: Diagnosis not present

## 2023-07-23 DIAGNOSIS — D649 Anemia, unspecified: Secondary | ICD-10-CM | POA: Diagnosis not present

## 2023-07-23 DIAGNOSIS — K5521 Angiodysplasia of colon with hemorrhage: Secondary | ICD-10-CM | POA: Diagnosis not present

## 2023-07-23 DIAGNOSIS — D62 Acute posthemorrhagic anemia: Secondary | ICD-10-CM | POA: Diagnosis not present

## 2023-07-23 LAB — COMPREHENSIVE METABOLIC PANEL
ALT: 23 U/L (ref 0–44)
AST: 16 U/L (ref 15–41)
Albumin: 3.4 g/dL — ABNORMAL LOW (ref 3.5–5.0)
Alkaline Phosphatase: 65 U/L (ref 38–126)
Anion gap: 13 (ref 5–15)
BUN: 13 mg/dL (ref 8–23)
CO2: 31 mmol/L (ref 22–32)
Calcium: 9.3 mg/dL (ref 8.9–10.3)
Chloride: 94 mmol/L — ABNORMAL LOW (ref 98–111)
Creatinine, Ser: 0.99 mg/dL (ref 0.44–1.00)
GFR, Estimated: >60 mL/min (ref >60)
Glucose, Bld: 148 mg/dL — ABNORMAL HIGH (ref 70–99)
Potassium: 3.6 mmol/L (ref 3.5–5.1)
Sodium: 138 mmol/L (ref 135–145)
Total Bilirubin: 0.4 mg/dL (ref 0.3–1.2)
Total Protein: 6.9 g/dL (ref 6.5–8.1)

## 2023-07-23 LAB — CBC
HCT: 27.1 % — ABNORMAL LOW (ref 36.0–46.0)
Hemoglobin: 8 g/dL — ABNORMAL LOW (ref 12.0–15.0)
MCH: 21.2 pg — ABNORMAL LOW (ref 26.0–34.0)
MCHC: 29.5 g/dL — ABNORMAL LOW (ref 30.0–36.0)
MCV: 71.9 fL — ABNORMAL LOW (ref 80.0–100.0)
Platelets: 228 10*3/uL (ref 150–400)
RBC: 3.77 MIL/uL — ABNORMAL LOW (ref 3.87–5.11)
RDW: 25 % — ABNORMAL HIGH (ref 11.5–15.5)
WBC: 6.3 10*3/uL (ref 4.0–10.5)
nRBC: 0 % (ref 0.0–0.2)

## 2023-07-23 LAB — GLUCOSE, CAPILLARY
Glucose-Capillary: 142 mg/dL — ABNORMAL HIGH (ref 70–99)
Glucose-Capillary: 233 mg/dL — ABNORMAL HIGH (ref 70–99)
Glucose-Capillary: 119 mg/dL — ABNORMAL HIGH (ref 70–99)
Glucose-Capillary: 222 mg/dL — ABNORMAL HIGH (ref 70–99)

## 2023-07-23 NOTE — Plan of Care (Signed)
  Problem: Education: Goal: Knowledge of General Education information will improve Description: Including pain rating scale, medication(s)/side effects and non-pharmacologic comfort measures Outcome: Progressing   Problem: Health Behavior/Discharge Planning: Goal: Ability to manage health-related needs will improve Outcome: Progressing   Problem: Clinical Measurements: Goal: Ability to maintain clinical measurements within normal limits will improve Outcome: Progressing Goal: Will remain free from infection Outcome: Progressing   Problem: Nutrition: Goal: Adequate nutrition will be maintained Outcome: Progressing   Problem: Coping: Goal: Level of anxiety will decrease Outcome: Progressing   Problem: Pain Managment: Goal: General experience of comfort will improve Outcome: Progressing   Problem: Safety: Goal: Ability to remain free from injury will improve Outcome: Progressing   Problem: Skin Integrity: Goal: Risk for impaired skin integrity will decrease Outcome: Progressing   

## 2023-07-23 NOTE — Progress Notes (Addendum)
Progress Note  Primary GI: Dr. Russella Dar  LOS: 8 days   Chief Complaint: melena, anemia in setting of Eliquis   Subjective  Patient sitting in recliner, reports no new events over night. Started Eliquis 5mg  last night and another dose this morning. No blood per rectum. Bowel movement this am , light in color. No nose bleeds today. No chest pain or dizziness.  Tolerating regular diet.  No nausea or vomiting.  No abdominal pain.   No family was present at the time of my evaluation.   Objective   Vital signs in last 24 hours: Temp:  [97.9 F (36.6 C)-98.8 F (37.1 C)] 98.7 F (37.1 C) (10/08 0532) Pulse Rate:  [73-79] 73 (10/08 0532) Resp:  [15-18] 18 (10/08 0532) BP: (115-160)/(58-83) 115/66 (10/08 0532) SpO2:  [92 %-100 %] 99 % (10/08 0745) Weight:  [108.9 kg] 108.9 kg (10/08 0500) Last BM Date : 07/22/23 Last BM recorded by nurses in past 5 days Stool Type: Type 5 (Soft blobs with clear-cut edges) (07/18/2023  2:03 AM)  General:   female in no acute distress  Heart:  Regular rate and rhythm; no murmurs Pulm: Clear anteriorly; no wheezing, on 3L N/C Abdomen: soft,obese, nondistended, normal bowel sounds in all quadrants. Nontender without guarding. No organomegaly appreciated. Extremities:  trace bilateral lower extremity edema Neurologic:  Alert and  oriented x4;  No focal deficits.  Psych:  Cooperative. Normal mood and affect.  Intake/Output from previous day: No intake/output data recorded. Intake/Output this shift: Total I/O In: 118 [P.O.:118] Out: -   Studies/Results: No results found.  Lab Results: Recent Labs    07/21/23 0538 07/22/23 0520 07/23/23 0517  WBC 7.3 6.1 6.3  HGB 8.3* 8.4* 8.0*  HCT 27.3* 28.5* 27.1*  PLT 212 234 228   BMET Recent Labs    07/21/23 0538 07/22/23 0520 07/23/23 0517  NA 139 140 138  K 3.2* 3.4* 3.6  CL 97* 99 94*  CO2 30 29 31   GLUCOSE 141* 125* 148*  BUN 11 11 13   CREATININE 1.02* 0.98 0.99  CALCIUM 8.6* 9.2 9.3    LFT Recent Labs    07/23/23 0517  PROT 6.9  ALBUMIN 3.4*  AST 16  ALT 23  ALKPHOS 65  BILITOT 0.4   PT/INR No results for input(s): "LABPROT", "INR" in the last 72 hours.   Scheduled Meds:  apixaban  5 mg Oral BID   atorvastatin  80 mg Oral Daily   diltiazem  360 mg Oral Daily   folic acid  1 mg Oral Daily   furosemide  40 mg Oral Daily   hydroxychloroquine  200 mg Oral BID   insulin aspart  0-5 Units Subcutaneous QHS   insulin aspart  0-9 Units Subcutaneous TID WC   ipratropium-albuterol  3 mL Nebulization TID   isosorbide mononitrate  30 mg Oral Daily   leflunomide  20 mg Oral Daily   losartan  50 mg Oral Daily   melatonin  10 mg Oral QHS   pantoprazole (PROTONIX) IV  40 mg Intravenous Q12H   traZODone  50 mg Oral QHS   Continuous Infusions:     Patient Narrative:  67 year old African-American female with history of recurrent anemia and GI bleeding in setting of chronic Eliquis for atrial fibrillation. Admitted with 1 week history of melena and complaints of fatigue. Hemoglobin was 8 on 07/12/2023, repeat on admit 7.3.   Impression/Plan:   Recurrent anemia acute on chronic, s/p GI bleed, known AVMs, on  chronic Eliquis for atrial fibrillation. Pt underwent (10/2) small bowel enteroscopy and capsule endoscopy with evidence of bleeding in small bowel beyond reach of the endoscope. Plans for deep enteroscopy cancelled last week d/t CHF and resp distress. Feeling better after Lasix 40 mg po daily. Received 3 units PRBCs since admission. Restarted on Eliquis last night, no episodes of bleeding over night. Did have nose bleed yesterday prior to starting Eliquis. Hemoglobin 9>8.3>8.4>8.0. chronically low MCV 71.9 secondary thalassemia BUN 13/Creat 0.99 -Continue Pantoprazole 40mg  po daily -Monitor H/H - Spoke with [facility] yesterday with no active bleeding and stable Hgb they recommended we restart Eliquis and monitor inpatient. If bleeding reoccurs we can transfer her to  have balloon enteroscopy. However if patient remains stable we can do outpatient balloon enteroscopy with supportive care.  Atrial fibrillation on Eliquis -Restarted Eliquis on 10/7 pm and monitoring inpatient for reoccurrence of bleeding.  Hypokalemia.  Potassium 3.6 -replete per protocol  Interstitial lung disease/COPD on chronic oxygen 3 L at home  Mitral stenosis- Last echo earlier this year EF 60 to 65% moderate mitral stenosis no AS.   Principal Problem:   Occult GI bleeding Active Problems:   Essential hypertension   Anxiety   Rheumatoid arthritis (HCC)   PAF (paroxysmal atrial fibrillation) (HCC); CHA2DS2-VASc score 7   ABLA (acute blood loss anemia)   Dyslipidemia   Type 2 diabetes mellitus without complications (HCC)   COPD (chronic obstructive pulmonary disease) (HCC)   Obesity, Class III, BMI 40-49.9 (morbid obesity) (HCC)   AVM (arteriovenous malformation) of small bowel, acquired with hemorrhage  Deanna J May  07/23/2023, 11:29 AM    Attending Physician Note   I have taken an interval history, reviewed the chart and examined the patient. I performed a substantive portion of this encounter, including complete performance of at least one of the key components, in conjunction with the APP. I agree with the APP's note, impression and recommendations with my edits. Consider discharge tomorrow if no evidence of recurrent bleeding.   Claudette Head, MD [facility] See AMION, [facility], for our on call provider

## 2023-07-23 NOTE — Telephone Encounter (Signed)
Of note: Patient currently hospitalized as of 07/23/23 for occult gi bleeding. She is set to be discharged and have enteroscopy outpatient.   Request for surgical clearance:     Endoscopy Procedure  What type of surgery is being performed?     Single balloon enteroscopy of small bowel  When is this surgery scheduled?     08/13/23  What type of clearance is required ?   Pharmacy  Are there any medications that need to be held prior to surgery and how long? Eliquis, 2 days  Practice name and name of physician performing surgery?      [facility]  What is your office phone and fax number?      Phone- [phone]  Fax- [phone]  Anesthesia type (None, local, MAC, general) ?       MAC

## 2023-07-23 NOTE — Progress Notes (Signed)
Progress Note   Patient: Lynn [name] NWG:[id number] DOB: [birth year] DOA: 07/15/2023     8 DOS: the patient was seen and examined on 07/23/2023   Brief hospital course: 67yo with h/o recurrent GI bleeding from gastric ulcer/AVMs, HTN, HLD, chronic diastolic CHF, COPD, CAD, CVA, OSA, DM, and morbid obesity who presented on 9/30 with tarry stools, Hgb 7.1.  She was started on IV Protonix and GI was consulted.  Assessment and Plan: Subacute upper GI bleeding -Patient with h/o gastric ulcer and AVMs presenting with melena, suggestive of upper GI bleeding. -GI consulted. Pt now s/p EGD 10/2 which was found to be normal. Capsule was placed. Per GI, found to have active SB bleeding into study.  -Lesion noted to be beyond the reach of the standard endoscope. -discussed with GI. Would need enteroscopy, however unable to do here for logistical reasons. GI discussed with [facility] who recommended trial of resuming eliquis here, and if pt has recurrent bleed, then would pursue transfer to [facility] for further GI work up -No evidence of bleed today. Per GI, can consider d/c tomorrow if no evidence of recurrent bleeding   ABLA -Patient's lightheadedness and fatigue are most likely caused by anemia secondary to upper GI bleeding.  -s/p total 3 units PRBC's this visit -hgb thus far stable   Essential hypertension/CAD -Continue diltiazem, Imdur, losartan   Dyslipidemia -Continue atorvastatin   Rheumatoid arthritis (HCC) -Continue Plaquenil, Arva   Type 2 diabetes mellitus without complications (HCC) -Pior A1c was 6.9 in July, good coverage -Contiue sensitive-scale SSI for now -given dose of scheduled Mounjaro 10/6   Anxiety -Continue alprazolam   COPD -Wears 3-4L home O2, on home O2 at this time -Continue Albuterol as needed. -Pt reports using q6h nebs at home. Have continued on Q6h duonebs here   Afib with hx CVA -Initially held Eliquis in the setting of GI bleed -Rate controlled  with diltiazem -Resumed eliquis 10/7   Morbid obesity -Has been on Mounjaro with improvement -Body mass index is 41.79 kg/m..  -Ongoing weight loss should be encouraged -Outpatient PCP/bariatric medicine/bariatric surgery f/u encouraged   Acutely decompensated diastolic CHF exacerbation -Recently had worsening sob and acute CHF exacerbation in the setting of receiving 3 units PRBC's this visit -Good improvement with 40mg  po lasix, will continue -follow I/o and daily wts -recheck bmet in AM  Hypokalemia -Replaced   Subjective: No complaints this AM. Reports normal brown stool this AM  Physical Exam: Vitals:   07/23/23 0532 07/23/23 0744 07/23/23 0745 07/23/23 1226  BP: 115/66   126/70  Pulse: 73   74  Resp: 18   17  Temp: 98.7 F (37.1 C)   98 F (36.7 C)  TempSrc: Oral   Oral  SpO2: 99% 99% 99% 100%  Weight:      Height:       General exam: Conversant, in no acute distress Respiratory system: normal chest rise, clear, no audible wheezing Cardiovascular system: regular rhythm, s1-s2 Gastrointestinal system: Nondistended, nontender, pos BS Central nervous system: No seizures, no tremors Extremities: No cyanosis, no joint deformities Skin: No rashes, no pallor Psychiatry: Affect normal // no auditory hallucinations   Data Reviewed:  Labs reviewed: Na 138, K 3.6, Cr 0.99, WBC 6.3, Hgb 8.0, Plts 228  Family Communication: Pt in room, family not at bedside  Disposition: Status is: Inpatient Remains inpatient appropriate because: severity of illness  Planned Discharge Destination: Home    Author: Rickey Barbara, MD 07/23/2023 5:08 PM  For on  call review [email or web address].

## 2023-07-23 NOTE — Plan of Care (Signed)

## 2023-07-23 NOTE — Telephone Encounter (Signed)
Lynn [name] February 04, 1956 [id number]   Dear Dr. Lonzo Cloud:    Dr. Barron Alvine has scheduled the above individual for a single balloon enteroscopy of the small bowel at [facility] on 08/13/23.  Our records show that this patient is on insulin therapy via an insulin pump.  Our prep protocol requires that:   the patient must be on a clear liquid diet starting midnight the night before the procedure date as well as the morning of the procedure  the patient must be NPO for 3 to 4 hours prior to the procedure    Please advise [place] of any adjustments that need to be made to the patient's insulin pump (omnipod) therapy prior to the above procedure date.    Please route your response to Vernia Buff, RN. If you have any questions, please call me at [phone].  Thank you for your help with this matter.  Sincerely,  [facility]     Physician Recommendation:  ____________________________________________________________

## 2023-07-24 ENCOUNTER — Other Ambulatory Visit: Payer: Self-pay | Admitting: Gastroenterology

## 2023-07-24 DIAGNOSIS — D500 Iron deficiency anemia secondary to blood loss (chronic): Secondary | ICD-10-CM

## 2023-07-24 DIAGNOSIS — R195 Other fecal abnormalities: Secondary | ICD-10-CM | POA: Diagnosis not present

## 2023-07-24 LAB — COMPREHENSIVE METABOLIC PANEL
ALT: 20 U/L (ref 0–44)
AST: 16 U/L (ref 15–41)
Albumin: 3.5 g/dL (ref 3.5–5.0)
Alkaline Phosphatase: 65 U/L (ref 38–126)
Anion gap: 13 (ref 5–15)
BUN: 13 mg/dL (ref 8–23)
CO2: 33 mmol/L — ABNORMAL HIGH (ref 22–32)
Calcium: 9.3 mg/dL (ref 8.9–10.3)
Chloride: 94 mmol/L — ABNORMAL LOW (ref 98–111)
Creatinine, Ser: 1.16 mg/dL — ABNORMAL HIGH (ref 0.44–1.00)
GFR, Estimated: 51 mL/min — ABNORMAL LOW (ref >60)
Glucose, Bld: 151 mg/dL — ABNORMAL HIGH (ref 70–99)
Potassium: 3.4 mmol/L — ABNORMAL LOW (ref 3.5–5.1)
Sodium: 140 mmol/L (ref 135–145)
Total Bilirubin: 0.5 mg/dL (ref 0.3–1.2)
Total Protein: 7.1 g/dL (ref 6.5–8.1)

## 2023-07-24 LAB — CBC
HCT: 27.4 % — ABNORMAL LOW (ref 36.0–46.0)
Hemoglobin: 8.3 g/dL — ABNORMAL LOW (ref 12.0–15.0)
MCH: 21.3 pg — ABNORMAL LOW (ref 26.0–34.0)
MCHC: 30.3 g/dL (ref 30.0–36.0)
MCV: 70.3 fL — ABNORMAL LOW (ref 80.0–100.0)
Platelets: 240 10*3/uL (ref 150–400)
RBC: 3.9 MIL/uL (ref 3.87–5.11)
RDW: 24.9 % — ABNORMAL HIGH (ref 11.5–15.5)
WBC: 6.4 10*3/uL (ref 4.0–10.5)
nRBC: 0 % (ref 0.0–0.2)

## 2023-07-24 LAB — GLUCOSE, CAPILLARY: Glucose-Capillary: 142 mg/dL — ABNORMAL HIGH (ref 70–99)

## 2023-07-24 MED ORDER — CYCLOBENZAPRINE HCL 5 MG PO TABS: 5.0000 mg | ORAL_TABLET | Freq: Three times a day (TID) | ORAL | 0 refills | Status: DC | PRN

## 2023-07-24 MED ORDER — POTASSIUM CHLORIDE CRYS ER 20 MEQ PO TBCR
20.0000 meq | EXTENDED_RELEASE_TABLET | Freq: Once | ORAL | Status: AC
  Administered 2023-07-24: 20 meq via ORAL
  Filled 2023-07-24: qty 1

## 2023-07-24 MED ORDER — PANTOPRAZOLE SODIUM 40 MG PO TBEC: 40.0000 mg | DELAYED_RELEASE_TABLET | Freq: Every day | ORAL | 0 refills | Status: DC

## 2023-07-24 NOTE — Progress Notes (Unsigned)
I was contacted yesterday via secure chat by Quentin Mulling, PA-C to get patient set up for outpatient balloon enteroscopy. Patient has been schedueled for enteroscopy on 08/13/23 and I will reach out to her with prep instructions upon her hospital discharge. In addition, patient will be advised that she will need labs next week at our office as requested by Deanna May, NP (orders already placed in [facility]).

## 2023-07-24 NOTE — Telephone Encounter (Signed)
Patient with diagnosis of afib on Eliquis for anticoagulation.    Procedure: Single balloon enteroscopy of small bowel   Date of procedure: 08/13/23   CHA2DS2-VASc Score = 6   This indicates a 9.7% annual risk of stroke. The patient's score is based upon: CHF History: 1 HTN History: 1 Diabetes History: 1 Stroke History: 0 Vascular Disease History: 1 Age Score: 1 Gender Score: 1      CrCl 53 ml/min Platelet count 240  Per office protocol, patient can hold Eliquis for 2 days prior to procedure.    **This guidance is not considered finalized until pre-operative APP has relayed final recommendations.**

## 2023-07-24 NOTE — Progress Notes (Addendum)
Progress Note  Primary GI: Dr. Russella Dar  LOS: 9 days   Chief Complaint: melena, anemia in setting of Eliquis   Subjective   Patient sitting up in recliner this morning.  Tolerated breakfast.  No events overnight.  No rectal bleeding per rectum. Has not had BM today.  No abdominal pain, nausea, or vomiting.  No shortness of breath or chest pain. Notes nose bleed which she has experienced intermittently in past and evaluated by ENT. Discussed plan upon discharge and s/s to report. Pt verbalizes understanding.  No family was present at the time of my evaluation.   Objective   Vital signs in last 24 hours: Temp:  [97.9 F (36.6 C)-99.1 F (37.3 C)] 97.9 F (36.6 C) (10/09 0554) Pulse Rate:  [73-75] 73 (10/09 0554) Resp:  [17-24] 17 (10/09 0554) BP: (117-135)/(51-70) 135/51 (10/09 0554) SpO2:  [94 %-100 %] 94 % (10/09 0736) Weight:  [106.5 kg] 106.5 kg (10/09 0500) Last BM Date : 07/23/23 Last BM recorded by nurses in past 5 days No data recorded  General:   female in no acute distress  Heart:  Regular rate and rhythm; no murmurs Pulm: Clear anteriorly; no wheezing, on 3L N/C  Abdomen: soft,obese, nondistended, normal bowel sounds in all quadrants. Nontender without guarding. No organomegaly appreciated. Extremities:  trace bilateral lower extremity edema Neurologic:  Alert and  oriented x4;  No focal deficits.  Psych:  Cooperative. Normal mood and affect.  Intake/Output from previous day: 10/08 0701 - 10/09 0700 In: 118 [P.O.:118] Out: -  Intake/Output this shift: No intake/output data recorded.  Studies/Results: No results found.  Lab Results: Recent Labs    07/22/23 0520 07/23/23 0517 07/24/23 0550  WBC 6.1 6.3 6.4  HGB 8.4* 8.0* 8.3*  HCT 28.5* 27.1* 27.4*  PLT 234 228 240   BMET Recent Labs    07/22/23 0520 07/23/23 0517 07/24/23 0550  NA 140 138 140  K 3.4* 3.6 3.4*  CL 99 94* 94*  CO2 29 31 33*  GLUCOSE 125* 148* 151*  BUN 11 13 13   CREATININE  0.98 0.99 1.16*  CALCIUM 9.2 9.3 9.3   LFT Recent Labs    07/24/23 0550  PROT 7.1  ALBUMIN 3.5  AST 16  ALT 20  ALKPHOS 65  BILITOT 0.5   PT/INR No results for input(s): "LABPROT", "INR" in the last 72 hours.   Scheduled Meds:  apixaban  5 mg Oral BID   atorvastatin  80 mg Oral Daily   diltiazem  360 mg Oral Daily   folic acid  1 mg Oral Daily   furosemide  40 mg Oral Daily   hydroxychloroquine  200 mg Oral BID   insulin aspart  0-5 Units Subcutaneous QHS   insulin aspart  0-9 Units Subcutaneous TID WC   ipratropium-albuterol  3 mL Nebulization TID   isosorbide mononitrate  30 mg Oral Daily   leflunomide  20 mg Oral Daily   losartan  50 mg Oral Daily   melatonin  10 mg Oral QHS   pantoprazole (PROTONIX) IV  40 mg Intravenous Q12H   traZODone  50 mg Oral QHS   Continuous Infusions:    Patient Narrative:  67 year old African-American female with history of recurrent anemia and GI bleeding in setting of chronic Eliquis for atrial fibrillation. Admitted with 1 week history of melena and complaints of fatigue. Hemoglobin was 8 on 07/12/2023, repeat on admit 7.3.   Impression/Plan:   Recurrent anemia acute on chronic, s/p GI  bleed, known AVMs, on chronic Eliquis for atrial fibrillation. Pt underwent (10/2) small bowel enteroscopy/capsule endoscopy with evidence of bleeding in small bowel beyond reach of the endoscope. Plans for deep enteroscopy cancelled last week d/t CHF and resp distress. Feeling better with Lasix 40 mg po daily. Received 3 units PRBCs since admission. Restarted on Eliquis 10/7 pm, no episodes of bleeding last two days. Hemoglobin 8.4>8.0>8.3. chronically low MCV 70.3 secondary thalassemia BUN 13/Creat 1.16 -Continue Pantoprazole 40mg  po daily outpatient -Spoke with [facility] on Monday and initiated Eliquis Monday evening. Pt has not had any episodes of bleeding in last two days. Tentatively scheduled outpt balloon enteroscopy with Dr. Lonna Cobb on 10/29.  Nurse will be calling with instructions. Follow-up outpatient closely with CBC. Educated patient on ER precautions melena, SOB, CP.  Atrial fibrillation on Eliquis -Restarted Eliquis on 10/7 pm. Stable with no bleeding 2 days.  Hypokalemia.  Potassium 3.4 -replete per protocol  Interstitial lung disease/COPD on chronic oxygen 3 L at home  Mitral stenosis- Last echo earlier this year EF 60 to 65% moderate mitral stenosis no AS.   Principal Problem:   Occult GI bleeding Active Problems:   Essential hypertension   Anxiety   Rheumatoid arthritis (HCC)   PAF (paroxysmal atrial fibrillation) (HCC); CHA2DS2-VASc score 7   ABLA (acute blood loss anemia)   Dyslipidemia   Type 2 diabetes mellitus without complications (HCC)   COPD (chronic obstructive pulmonary disease) (HCC)   Obesity, Class III, BMI 40-49.9 (morbid obesity) (HCC)   AVM (arteriovenous malformation) of small bowel, acquired with hemorrhage  Lynn Lynn [name]  07/24/2023, 8:25 AM

## 2023-07-24 NOTE — Progress Notes (Unsigned)
-----   Message -----  From: Richardson Chiquito, RN  Sent: 07/23/2023  12:00 AM EDT  To: Richardson Chiquito, RN    ----- Message -----  From: Shellia Cleverly, DO  Sent: 07/22/2023   1:04 PM EDT  To: Richardson Chiquito, RN   Ok.  Lets tentatively set that spot aside for Ms. Limb.  I will follow the inpatient notes and see what they decide to do with regards to discharge home vs transfer and keep you posted.  Thanks.  ----- Message -----  From: Richardson Chiquito, RN  Sent: 07/22/2023  12:58 PM EDT  To: Shellia Cleverly, DO   I will very likely have availability on 10/29.  ----- Message -----  From: Shellia Cleverly, DO  Sent: 07/22/2023  12:48 PM EDT  To: Richardson Chiquito, RN   This patient is currently admitted and needs a single balloon enteroscopy.  There is a chance that they transfer her to [facility] for this procedure since I am not inpatient this week and unlikely to be able to get to her until Thursday.  If, however, she is instead discharged home, what kind of availability do I have at [facility] for balloon enteroscopy in the very near future?

## 2023-07-24 NOTE — Plan of Care (Signed)
  Problem: Education: Goal: Knowledge of General Education information will improve Description Including pain rating scale, medication(s)/side effects and non-pharmacologic comfort measures Outcome: Progressing   Problem: Health Behavior/Discharge Planning: Goal: Ability to manage health-related needs will improve Outcome: Progressing   Problem: Clinical Measurements: Goal: Ability to maintain clinical measurements within normal limits will improve Outcome: Progressing Goal: Will remain free from infection Outcome: Progressing   Problem: Nutrition: Goal: Adequate nutrition will be maintained Outcome: Progressing   Problem: Pain Managment: Goal: General experience of comfort will improve Outcome: Progressing   Problem: Safety: Goal: Ability to remain free from injury will improve Outcome: Progressing   

## 2023-07-24 NOTE — Progress Notes (Signed)
Will advise patient of need for labs at the time she is given prep instructions following hospital discharge.

## 2023-07-24 NOTE — Discharge Summary (Signed)
Physician Discharge Summary   Patient: Lynn [name] MRN: [id number] DOB: 1956/06/19  Admit date:     07/15/2023  Discharge date: 07/24/23  Discharge Physician: Tyrone Nine   PCP: Pearline Cables, MD   Recommendations at discharge:  Follow up with GI, planning for enteroscopy 10/29 with Dr. Barron Alvine.  Follow up with ENT for left-sided epistaxis for consideration of repeat electrocautery.   Discharge Diagnoses: Principal Problem:   Occult GI bleeding Active Problems:   ABLA (acute blood loss anemia)   Essential hypertension   Dyslipidemia   PAF (paroxysmal atrial fibrillation) (HCC); CHA2DS2-VASc score 7   Rheumatoid arthritis (HCC)   Anxiety   Type 2 diabetes mellitus without complications (HCC)   COPD (chronic obstructive pulmonary disease) (HCC)   Obesity, Class III, BMI 40-49.9 (morbid obesity) (HCC)   AVM (arteriovenous malformation) of small bowel, acquired with hemorrhage  Resolved Problems:   * No resolved hospital problems. [facility] Course: [phone] with h/o recurrent GI bleeding from gastric ulcer/AVMs, HTN, HLD, chronic diastolic CHF, COPD, CAD, CVA, OSA, DM, and morbid obesity who presented on 9/30 with tarry stools, Hgb 7.1.  She was started on IV Protonix and GI was consulted.  Assessment and Plan: Subacute upper GI bleeding -Patient with h/o gastric ulcer and AVMs presenting with melena, suggestive of upper GI bleeding. -GI consulted. Pt now s/p EGD 10/2 which was found to be normal. Capsule was placed. Per GI, found to have active SB bleeding into study.  -Lesion noted to be beyond the reach of the standard endoscope. -discussed with GI. Would need enteroscopy, however unable to do here for logistical reasons. GI discussed with [facility] who recommended trial of resuming eliquis here, and if pt has recurrent bleed, then would pursue transfer to [facility] for further GI work up -No evidence of bleed with normal BMs. GI reports the patient is scheduled  for outpatient balloon enteroscopy with Dr. Barron Alvine on 10/29. Return precautions advised. Continue PPI    ABLA: Hgb stabilized at 8.3g/dl with no bleeding J19 hours after restarting DOAC.  -s/p total 3 units PRBC's this visit   Essential hypertension/CAD -Continue diltiazem, Imdur, losartan   Dyslipidemia -Continue atorvastatin   Rheumatoid arthritis (HCC) -Continue home meds   Type 2 diabetes mellitus without complications (HCC) -Pior A1c was 6.9 in July, good coverage  -given dose of scheduled Mounjaro 10/6 - Follow up with endocrinology, Dr. Lonzo Cloud as routine.    Anxiety -Continue alprazolam  Chronic hypoxic respiratory failure due to COPD, ILD:  -Wears 3-4L home O2, on home O2 at this time -Continue Albuterol as needed.    PAF with hx CVA: CHA2DS2-VASc score is 7. Normal rate by exam.  -Initially held Eliquis in the setting of GI bleed -Rate controlled with diltiazem -Resumed eliquis 10/7   Morbid obesity -Has been on Mounjaro with improvement -Body mass index is 41.79 kg/m..  -Ongoing weight loss should be encouraged -Outpatient PCP/bariatric medicine/bariatric surgery f/u encouraged    Acute on chronic HFpEF, moderate MS:  -Recently had worsening sob and acute CHF exacerbation in the setting of receiving 3 units PRBC's this visit -Good improvement with 40mg  po lasix, will continue -follow I/o and daily wts -recheck bmet in AM - Repeat echo per outpatient cardiology.    Hypokalemia -Replaced  Consultants: GI Procedures performed: EGD  Disposition: Home Diet recommendation: Carb-modified, heart healthy DISCHARGE MEDICATION: Allergies as of 07/24/2023       Reactions   Methotrexate Other (See Comments)   Pneumonitis  Penicillins Hives   Childhood allergy Has patient had a PCN reaction causing immediate rash, facial/tongue/throat swelling, SOB or lightheadedness with hypotension: Yes Has patient had a PCN reaction causing severe rash involving  mucus membranes or skin necrosis: No Has patient had a PCN reaction that required hospitalization No Has patient had a PCN reaction occurring within the last 10 years: No If all of the above answers are "NO", then may proceed with Cephalosporin use.   Marcelline Deist [dapagliflozin] Other (See Comments)   Dizzy and lethargic   Metformin And Related Diarrhea, Other (See Comments)   Also, bleeding        Medication List     TAKE these medications    acetaminophen 500 MG tablet Commonly known as: TYLENOL Take 1,000 mg by mouth every 6 (six) hours as needed (for pain).   albuterol (2.5 MG/3ML) 0.083% nebulizer solution Commonly known as: PROVENTIL Take 3 mLs (2.5 mg total) by nebulization every 6 (six) hours as needed for wheezing or shortness of breath. What changed:  when to take this additional instructions   albuterol 108 (90 Base) MCG/ACT inhaler Commonly known as: VENTOLIN HFA TAKE 2 PUFFS BY MOUTH EVERY 6 HOURS AS NEEDED FOR WHEEZE OR SHORTNESS OF BREATH What changed: See the new instructions.   ALPRAZolam 0.25 MG tablet Commonly known as: XANAX Take 1-2 tablets (0.25-0.5 mg total) by mouth 2 (two) times daily as needed for anxiety. Can also take a dose as needed for insomnia What changed:  reasons to take this additional instructions   apixaban 5 MG Tabs tablet Commonly known as: ELIQUIS Take 1 tablet (5 mg total) by mouth 2 (two) times daily. Resume taking on 12/19/22   atorvastatin 80 MG tablet Commonly known as: LIPITOR Take 1 tablet (80 mg total) by mouth every evening. What changed: when to take this   B-D UF III MINI PEN NEEDLES 31G X 5 MM Misc Generic drug: Insulin Pen Needle USE DAILY WITH VICTOZA AND LANTUS. What changed: additional instructions   Insulin Pen Needle 31G X 5 MM Misc 1 Device by Does not apply route 3 (three) times daily. What changed: Another medication with the same name was changed. Make sure you understand how and when to take each.    cetirizine 10 MG tablet Commonly known as: ZYRTEC Take 10 mg by mouth daily as needed for allergies.   Contour Next Test test strip Generic drug: glucose blood 3x daily   cyclobenzaprine 5 MG tablet Commonly known as: FLEXERIL Take 1 tablet (5 mg total) by mouth 3 (three) times daily as needed for muscle spasms.   Dexcom G6 Transmitter Misc 1 Device by Does not apply route as directed.   Dexcom G7 Sensor Misc 1 Device by Does not apply route as directed.   diltiazem 360 MG 24 hr capsule Commonly known as: CARDIZEM CD Take 1 capsule (360 mg total) by mouth daily.   folic acid 1 MG tablet Commonly known as: FOLVITE Take 2 tablets (2 mg total) by mouth daily. What changed: how much to take   furosemide 20 MG tablet Commonly known as: LASIX DECREASE LASIX ( FUROSEMIDE) TO 20 MG ( 1/2/TABLET) , WITH 20 MG YOU MAY TAKE A AN ADDITIONAL 20 MG IF NEED FOR SWELLING OR WEIGHT GAIN MORE THAN 3 LBS OVERNIGHT OR 5 LBS IN ONE WEEK -- IF YOU HAVE CONTINUE TO TAKE AN ADDITIONAL 20 MG , GO BACK TO 40 MG TAKE ADDITIONAL DOSE AS NEEDED FOR EGT GAIN> 3 LB IN  1 DAY What changed: See the new instructions.   hydroxychloroquine 200 MG tablet Commonly known as: PLAQUENIL Take 200 mg by mouth 2 (two) times daily.   insulin lispro 100 UNIT/ML injection Commonly known as: HUMALOG Max Daily 100 units per pump What changed:  how much to take when to take this additional instructions   isosorbide mononitrate 30 MG 24 hr tablet Commonly known as: IMDUR Take 1 tablet (30 mg total) by mouth daily.   Lancets 30G Misc 1 Device by Does not apply route 3 (three) times daily.   leflunomide 20 MG tablet Commonly known as: ARAVA Take 20 mg by mouth daily.   losartan 100 MG tablet Commonly known as: COZAAR Take 0.5 tablets (50 mg total) by mouth daily.   Melatonin 5 MG Chew Chew 10 mg by mouth at bedtime.   multivitamin with minerals Tabs tablet Take 1 tablet by mouth daily with breakfast.    Omnipod 5 G6 Pods (Gen 5) Misc Inject 1 Device into the skin every 3 (three) days.   pantoprazole 40 MG tablet Commonly known as: PROTONIX Take 1 tablet (40 mg total) by mouth daily.   SAFETY-LOK TB SYRINGE 27GX.5" 27G X 1/2" 1 ML Misc Generic drug: TUBERCULIN SYR 1CC/27GX1/2"   sodium chloride 0.65 % Soln nasal spray Commonly known as: OCEAN Place 1 spray into both nostrils as needed. What changed: reasons to take this   tirzepatide 10 MG/0.5ML Pen Commonly known as: MOUNJARO Inject 10 mg into the skin once a week. What changed: when to take this   traZODone 50 MG tablet Commonly known as: DESYREL Take 0.5-1 tablets (25-50 mg total) by mouth at bedtime as needed. for sleep What changed:  how much to take when to take this additional instructions   VICKS SINEX NA Place 1 spray into both nostrils every 12 (twelve) hours as needed (for congestion- MAX OF THREE CONSECUTIVE DAYS).        Follow-up Information     Copland, Gwenlyn Found, MD Follow up.   Specialty: Family Medicine Contact information: [address] [place] [address] [phone]                Discharge Exam: Filed Weights   07/22/23 0506 07/23/23 0500 07/24/23 0500  Weight: 108 kg 108.9 kg 106.5 kg  BP (!) 135/51 (BP Location: Left Arm)   Pulse 73   Temp 97.9 F (36.6 C) (Oral)   Resp 17   Ht 5\' 3"  (1.6 m)   Wt 106.5 kg   LMP  (LMP Unknown)   SpO2 94%   BMI 41.59 kg/m   Well-appearing, pleasant female in no distress Clear, nonlabored on 3L O2, nares appear grossly normal RRR, no MRG on exam, no definite JVD, trace pitting edema Soft, NT, ND  Condition at discharge: good  The results of significant diagnostics from this hospitalization (including imaging, microbiology, ancillary and laboratory) are listed below for reference.   Imaging Studies: DG CHEST PORT 1 VIEW  Result Date: 07/19/2023 CLINICAL DATA:  Shortness of breath. EXAM: PORTABLE CHEST 1 VIEW COMPARISON:   12/18/2022. FINDINGS: Low lung volume. Increased interstitial markings with bilateral hilar vascular congestion. There is left retrocardiac airspace opacity obscuring the left hemidiaphragm, descending thoracic aorta and blunting the left lateral costophrenic angle suggesting combination of left lower lobe atelectasis and/or consolidation with pleural effusion. Bilateral lung fields are otherwise clear. There is subtle blunting of right lateral costophrenic angle which may represent underlying trace pleural effusion versus superimposed soft tissue.  Stable cardio-mediastinal silhouette. No acute osseous abnormalities. The soft tissues are within normal limits. IMPRESSION: 1. Left lower lobe atelectasis and/or consolidation with pleural effusion. 2. Increased interstitial markings with bilateral hilar vascular congestion, favoring congestive heart failure/pulmonary edema. Electronically Signed   By: Jules Schick M.D.   On: 07/19/2023 07:59   DG Abd Portable 1V  Result Date: 07/18/2023 CLINICAL DATA:  Acute lower abdominal pain since yesterday status post enterostomy. EXAM: PORTABLE ABDOMEN - 1 VIEW COMPARISON:  None Available. FINDINGS: The bowel gas pattern is normal. No radio-opaque calculi or other significant radiographic abnormality are seen. IMPRESSION: Negative. Electronically Signed   By: Lupita Raider M.D.   On: 07/18/2023 14:43   MM 3D SCREENING MAMMOGRAM BILATERAL BREAST  Result Date: 07/08/2023 CLINICAL DATA:  Screening. EXAM: DIGITAL SCREENING BILATERAL MAMMOGRAM WITH TOMOSYNTHESIS AND CAD TECHNIQUE: Bilateral screening digital craniocaudal and mediolateral oblique mammograms were obtained. Bilateral screening digital breast tomosynthesis was performed. The images were evaluated with computer-aided detection. COMPARISON:  Previous exam(s). ACR Breast Density Category a: The breasts are almost entirely fatty. FINDINGS: There are no findings suspicious for malignancy. IMPRESSION: No mammographic  evidence of malignancy. A result letter of this screening mammogram will be mailed directly to the patient. RECOMMENDATION: Screening mammogram in one year. (Code:[id number]-B-01Y) BI-RADS CATEGORY  1: Negative. Electronically Signed   By: Frederico Hamman M.D.   On: 07/08/2023 14:55    Microbiology: Results for orders placed or performed during the hospital encounter of 11/13/21  Resp Panel by RT-PCR (Flu A&B, Covid) Nasopharyngeal Swab     Status: None   Collection Time: 11/13/21 10:00 PM   Specimen: Nasopharyngeal Swab; Nasopharyngeal(NP) swabs in vial transport medium  Result Value Ref Range Status   SARS Coronavirus 2 by RT PCR NEGATIVE NEGATIVE Final    Comment: (NOTE) SARS-CoV-2 target nucleic acids are NOT DETECTED.  The SARS-CoV-2 RNA is generally detectable in upper respiratory specimens during the acute phase of infection. The lowest concentration of SARS-CoV-2 viral copies this assay can detect is 138 copies/mL. A negative result does not preclude SARS-Cov-2 infection and should not be used as the sole basis for treatment or other patient management decisions. A negative result may occur with  improper specimen collection/handling, submission of specimen other than nasopharyngeal swab, presence of viral mutation(s) within the areas targeted by this assay, and inadequate number of viral copies(<138 copies/mL). A negative result must be combined with clinical observations, patient history, and epidemiological information. The expected result is Negative.  Fact Sheet for Patients:  BloggerCourse.com  Fact Sheet for Healthcare Providers:  SeriousBroker.it  This test is no t yet approved or cleared by the [place] FDA and  has been authorized for detection and/or diagnosis of SARS-CoV-2 by FDA under an Emergency Use Authorization (EUA). This EUA will remain  in effect (meaning this test can be used) for the duration of  the COVID-19 declaration under Section 564(b)(1) of the Act, 21 U.S.C.section 360bbb-3(b)(1), unless the authorization is terminated  or revoked sooner.       Influenza A by PCR NEGATIVE NEGATIVE Final   Influenza B by PCR NEGATIVE NEGATIVE Final    Comment: (NOTE) The Xpert Xpress SARS-CoV-2/FLU/RSV plus assay is intended as an aid in the diagnosis of influenza from Nasopharyngeal swab specimens and should not be used as a sole basis for treatment. Nasal washings and aspirates are unacceptable for Xpert Xpress SARS-CoV-2/FLU/RSV testing.  Fact Sheet for Patients: BloggerCourse.com  Fact Sheet for Healthcare Providers: SeriousBroker.it  This test  is not yet approved or cleared by the [place] and has been authorized for detection and/or diagnosis of SARS-CoV-2 by FDA under an Emergency Use Authorization (EUA). This EUA will remain in effect (meaning this test can be used) for the duration of the COVID-19 declaration under Section 564(b)(1) of the Act, 21 U.S.C. section 360bbb-3(b)(1), unless the authorization is terminated or revoked.  Performed at [facility], [address], [place], [place] [address]     Labs: CBC: Recent Labs  Lab 07/20/23 [phone] 07/21/23 0538 07/22/23 0520 07/23/23 0517 07/24/23 0550  WBC 9.1 7.3 6.1 6.3 6.4  HGB 9.0* 8.3* 8.4* 8.0* 8.3*  HCT 30.2* 27.3* 28.5* 27.1* 27.4*  MCV 71.4* 71.5* 71.8* 71.9* 70.3*  PLT 236 212 234 228 240   Basic Metabolic Panel: Recent Labs  Lab 07/20/23 0811 07/21/23 0538 07/22/23 0520 07/23/23 0517 07/24/23 0550  NA 139 139 140 138 140  K 3.6 3.2* 3.4* 3.6 3.4*  CL 96* 97* 99 94* 94*  CO2 30 30 29 31  33*  GLUCOSE 156* 141* 125* 148* 151*  BUN 8 11 11 13 13   CREATININE 1.02* 1.02* 0.98 0.99 1.16*  CALCIUM 9.1 8.6* 9.2 9.3 9.3   Liver Function Tests: Recent Labs  Lab 07/20/23 0811 07/21/23 0538 07/22/23 0520 07/23/23 0517  07/24/23 0550  AST 22 18 16 16 16   ALT 28 27 26 23 20   ALKPHOS 71 70 70 65 65  BILITOT 0.8 0.4 0.5 0.4 0.5  PROT 7.5 6.8 7.3 6.9 7.1  ALBUMIN 3.5 3.2* 3.6 3.4* 3.5   CBG: Recent Labs  Lab 07/23/23 0728 07/23/23 1207 07/23/23 1656 07/23/23 2128 07/24/23 0737  GLUCAP 142* 233* 119* 222* 142*    Discharge time spent: greater than 30 minutes.  Signed: Tyrone Nine, MD [facility] 07/24/2023

## 2023-07-24 NOTE — Telephone Encounter (Signed)
Patient Name: Lynn [name]  DOB: [birth year] MRN: [id number]  Primary Cardiologist: Bryan Lemma, MD  Clinical pharmacists have reviewed the patient's past medical history, labs, and current medications as part of preoperative protocol coverage. The following recommendations have been made:   Patient with diagnosis of afib on Eliquis for anticoagulation.     Procedure: Single balloon enteroscopy of small bowel   Date of procedure: 08/13/23     CHA2DS2-VASc Score = 6  This indicates a 9.7% annual risk of stroke. The patient's score is based upon: CHF History: 1 HTN History: 1 Diabetes History: 1 Stroke History: 0 Vascular Disease History: 1 Age Score: 1 Gender Score: 1      CrCl 53 ml/min Platelet count 240   Per office protocol, patient can hold Eliquis for 2 days prior to procedure.  Please resume Eliquis as soon as possible postprocedure, at the discretion of the surgeon.    I will route this recommendation to the requesting party via Epic fax function and remove from pre-op pool.  Please call with questions.  Joylene Grapes, NP 07/24/2023, 4:30 PM

## 2023-07-24 NOTE — H&P (View-Only) (Signed)
Progress Note  Primary GI: Dr. Russella Dar  LOS: 9 days   Chief Complaint: melena, anemia in setting of Eliquis   Subjective   Patient sitting up in recliner this morning.  Tolerated breakfast.  No events overnight.  No rectal bleeding per rectum. Has not had BM today.  No abdominal pain, nausea, or vomiting.  No shortness of breath or chest pain. Notes nose bleed which she has experienced intermittently in past and evaluated by ENT. Discussed plan upon discharge and s/s to report. Pt verbalizes understanding.  No family was present at the time of my evaluation.   Objective   Vital signs in last 24 hours: Temp:  [97.9 F (36.6 C)-99.1 F (37.3 C)] 97.9 F (36.6 C) (10/09 0554) Pulse Rate:  [73-75] 73 (10/09 0554) Resp:  [17-24] 17 (10/09 0554) BP: (117-135)/(51-70) 135/51 (10/09 0554) SpO2:  [94 %-100 %] 94 % (10/09 0736) Weight:  [106.5 kg] 106.5 kg (10/09 0500) Last BM Date : 07/23/23 Last BM recorded by nurses in past 5 days No data recorded  General:   female in no acute distress  Heart:  Regular rate and rhythm; no murmurs Pulm: Clear anteriorly; no wheezing, on 3L N/C  Abdomen: soft,obese, nondistended, normal bowel sounds in all quadrants. Nontender without guarding. No organomegaly appreciated. Extremities:  trace bilateral lower extremity edema Neurologic:  Alert and  oriented x4;  No focal deficits.  Psych:  Cooperative. Normal mood and affect.  Intake/Output from previous day: 10/08 0701 - 10/09 0700 In: 118 [P.O.:118] Out: -  Intake/Output this shift: No intake/output data recorded.  Studies/Results: No results found.  Lab Results: Recent Labs    07/22/23 0520 07/23/23 0517 07/24/23 0550  WBC 6.1 6.3 6.4  HGB 8.4* 8.0* 8.3*  HCT 28.5* 27.1* 27.4*  PLT 234 228 240   BMET Recent Labs    07/22/23 0520 07/23/23 0517 07/24/23 0550  NA 140 138 140  K 3.4* 3.6 3.4*  CL 99 94* 94*  CO2 29 31 33*  GLUCOSE 125* 148* 151*  BUN 11 13 13   CREATININE  0.98 0.99 1.16*  CALCIUM 9.2 9.3 9.3   LFT Recent Labs    07/24/23 0550  PROT 7.1  ALBUMIN 3.5  AST 16  ALT 20  ALKPHOS 65  BILITOT 0.5   PT/INR No results for input(s): "LABPROT", "INR" in the last 72 hours.   Scheduled Meds:  apixaban  5 mg Oral BID   atorvastatin  80 mg Oral Daily   diltiazem  360 mg Oral Daily   folic acid  1 mg Oral Daily   furosemide  40 mg Oral Daily   hydroxychloroquine  200 mg Oral BID   insulin aspart  0-5 Units Subcutaneous QHS   insulin aspart  0-9 Units Subcutaneous TID WC   ipratropium-albuterol  3 mL Nebulization TID   isosorbide mononitrate  30 mg Oral Daily   leflunomide  20 mg Oral Daily   losartan  50 mg Oral Daily   melatonin  10 mg Oral QHS   pantoprazole (PROTONIX) IV  40 mg Intravenous Q12H   traZODone  50 mg Oral QHS   Continuous Infusions:    Patient Narrative:  67 year old African-American female with history of recurrent anemia and GI bleeding in setting of chronic Eliquis for atrial fibrillation. Admitted with 1 week history of melena and complaints of fatigue. Hemoglobin was 8 on 07/12/2023, repeat on admit 7.3.   Impression/Plan:   Recurrent anemia acute on chronic, s/p GI  bleed, known AVMs, on chronic Eliquis for atrial fibrillation. Pt underwent (10/2) small bowel enteroscopy/capsule endoscopy with evidence of bleeding in small bowel beyond reach of the endoscope. Plans for deep enteroscopy cancelled last week d/t CHF and resp distress. Feeling better with Lasix 40 mg po daily. Received 3 units PRBCs since admission. Restarted on Eliquis 10/7 pm, no episodes of bleeding last two days. Hemoglobin 8.4>8.0>8.3. chronically low MCV 70.3 secondary thalassemia BUN 13/Creat 1.16 -Continue Pantoprazole 40mg  po daily outpatient -Spoke with [facility] on Monday and initiated Eliquis Monday evening. Pt has not had any episodes of bleeding in last two days. Tentatively scheduled outpt balloon enteroscopy with Dr. Lonna Cobb on 10/29.  Nurse will be calling with instructions. Follow-up outpatient closely with CBC. Educated patient on ER precautions melena, SOB, CP.  Atrial fibrillation on Eliquis -Restarted Eliquis on 10/7 pm. Stable with no bleeding 2 days.  Hypokalemia.  Potassium 3.4 -replete per protocol  Interstitial lung disease/COPD on chronic oxygen 3 L at home  Mitral stenosis- Last echo earlier this year EF 60 to 65% moderate mitral stenosis no AS.   Principal Problem:   Occult GI bleeding Active Problems:   Essential hypertension   Anxiety   Rheumatoid arthritis (HCC)   PAF (paroxysmal atrial fibrillation) (HCC); CHA2DS2-VASc score 7   ABLA (acute blood loss anemia)   Dyslipidemia   Type 2 diabetes mellitus without complications (HCC)   COPD (chronic obstructive pulmonary disease) (HCC)   Obesity, Class III, BMI 40-49.9 (morbid obesity) (HCC)   AVM (arteriovenous malformation) of small bowel, acquired with hemorrhage  Lynn [name] Lynn [name]  07/24/2023, 8:25 AM

## 2023-07-25 ENCOUNTER — Encounter: Payer: Self-pay | Admitting: *Deleted

## 2023-07-25 NOTE — Telephone Encounter (Signed)
Patient instructed on insulin pump adjustments that she should make in preparation for her upcoming enteroscopy as per Dr Lonzo Cloud. Patient verbalizes understanding.

## 2023-07-25 NOTE — Progress Notes (Signed)
I have spoken to patient to advise of time/date/location of upcoming enteroscopy procedure and have given verbal prep instructions. She has also been advised that she will need a care partner 18 years or older to bring her, stay for the procedure and drive her home due to sedation. Written instructions have also been made available to the patient for additional review in mychart.  I have advised patient that per cardiology, she may hold her eliquis 2 days prior to her test.  Advised that per endocrinology, she should continue her insulin pump at the basal rate without boluses.  Advised that she will need to hold monjauro 7 days prior to her test.  Patient verbalizes understanding of all information and denies any questions at this time.

## 2023-07-29 ENCOUNTER — Other Ambulatory Visit: Payer: Medicare Other

## 2023-07-29 DIAGNOSIS — D500 Iron deficiency anemia secondary to blood loss (chronic): Secondary | ICD-10-CM

## 2023-07-30 LAB — CBC WITH DIFFERENTIAL/PLATELET
Absolute Monocytes: 775 {cells}/uL (ref 200–950)
Basophils Absolute: 68 {cells}/uL (ref 0–200)
Basophils Relative: 0.9 %
Eosinophils Absolute: 182 {cells}/uL (ref 15–500)
Eosinophils Relative: 2.4 %
HCT: 30.7 % — ABNORMAL LOW (ref 35.0–45.0)
Hemoglobin: 9.2 g/dL — ABNORMAL LOW (ref 11.7–15.5)
Lymphs Abs: 950 {cells}/uL (ref 850–3900)
MCH: 20.6 pg — ABNORMAL LOW (ref 27.0–33.0)
MCHC: 30 g/dL — ABNORMAL LOW (ref 32.0–36.0)
MCV: 68.7 fL — ABNORMAL LOW (ref 80.0–100.0)
Monocytes Relative: 10.2 %
Neutro Abs: 5624 {cells}/uL (ref 1500–7800)
Neutrophils Relative %: 74 %
Platelets: 319 10*3/uL (ref 140–400)
RBC: 4.47 10*6/uL (ref 3.80–5.10)
RDW: 24 % — ABNORMAL HIGH (ref 11.0–15.0)
Total Lymphocyte: 12.5 %
WBC: 7.6 10*3/uL (ref 3.8–10.8)

## 2023-07-30 LAB — CBC MORPHOLOGY

## 2023-08-06 ENCOUNTER — Encounter (HOSPITAL_COMMUNITY): Payer: Self-pay | Admitting: Gastroenterology

## 2023-08-06 NOTE — Progress Notes (Signed)
Pre op call eval Name:Lynn Dorthula Rue  MD Cardiologist-David Herbie [place] MD  EKG-09/04/22 Echo-11/30/22 Cath-08/08/21 Stress-n/a ICD/PM-n/a Blood thinner-Eliquis hold 2 days GLP-1- Mounjaro last dose 10/13 hold 7 days   Hx:CHF, CAD, Heart murmur, MI, Stroke, COPD, Coronary vasospasm, mitral stenosis, Atrial Fib., PVC's, ILD.  Was d/c from hospital 10/9 for UGIB. Last visit with cardiology was 10/9, given cardiac clearance at that appt. Last saw pulm 12/2022 where they recommended repeat PFT's in 12 weeks. Patient reports needing to call them to set up an appt but breathing feeling good overall. She remains on 3L02 at all times, reports fluid status has a lot to do with her breathing but has remained on the fluid pills and that helps.  She said she uses wheelchair mainly for getting around.  Anesthesia Review: Yes

## 2023-08-07 ENCOUNTER — Other Ambulatory Visit: Payer: Self-pay | Admitting: Nurse Practitioner

## 2023-08-08 ENCOUNTER — Encounter: Payer: Self-pay | Admitting: Internal Medicine

## 2023-08-12 ENCOUNTER — Other Ambulatory Visit (HOSPITAL_COMMUNITY): Payer: Self-pay

## 2023-08-12 NOTE — Anesthesia Preprocedure Evaluation (Signed)
Anesthesia Evaluation  Patient identified by MRN, date of birth, ID band Patient awake    Reviewed: Allergy & Precautions, NPO status , Patient's Chart, lab work & pertinent test results  Airway Mallampati: III  TM Distance: >3 FB Neck ROM: Full    Dental  (+) Dental Advisory Given   Pulmonary sleep apnea and Oxygen sleep apnea , COPD,  COPD inhaler and oxygen dependent, former smoker   Pulmonary exam normal breath sounds clear to auscultation       Cardiovascular hypertension, Pt. on medications + angina  + CAD, + Past MI, +CHF and + DOE  + dysrhythmias + Valvular Problems/Murmurs (Mod MS)  Rhythm:Irregular Rate:Normal  ECHO 11/2022   1. Left ventricular ejection fraction, by estimation, is 60 to 65%. The left ventricle has normal function. The left ventricle has no regional wall motion abnormalities. There is mild concentric left ventricular hypertrophy. Left ventricular diastolic parameters are consistent with Grade I diastolic dysfunction (impaired relaxation).  2. Right ventricular systolic function is normal. The right ventricular size is normal.  3. Left atrial size was moderately dilated.  4. Large calcified mass at posterior mitral annulus is likely exuberant mitral annular calcification. The mitral valve is degenerative. No evidence of mitral valve regurgitation. Moderate mitral stenosis. The mean mitral valve gradient is 12.0 mmHg, MVA 1.5 cm^2. Severe mitral annular calcification.  5. The aortic valve is tricuspid. There is moderate calcification of the aortic valve. Aortic valve regurgitation is not visualized. Aortic valve sclerosis/calcification is present, without any evidence of aortic stenosis.  6. The inferior vena cava is normal in size with greater than 50%respiratory variability, suggesting right atrial pressure of 3 mmHg.     Neuro/Psych  Headaches PSYCHIATRIC DISORDERS Anxiety Depression     Neuromuscular disease  CVA    GI/Hepatic Neg liver ROS, PUD,GERD  Medicated and Controlled,,  Endo/Other  diabetes, Insulin Dependent  Morbid obesity  Renal/GU Renal disease     Musculoskeletal  (+) Arthritis ,    Abdominal  (+) + obese  Peds  Hematology  (+) Blood dyscrasia, anemia   Anesthesia Other Findings   Reproductive/Obstetrics                             Anesthesia Physical Anesthesia Plan  ASA: 4  Anesthesia Plan: MAC   Post-op Pain Management: Minimal or no pain anticipated   Induction:   PONV Risk Score and Plan: 2 and Propofol infusion, Treatment may vary due to age or medical condition and TIVA  Airway Management Planned: Nasal Cannula  Additional Equipment:   Intra-op Plan:   Post-operative Plan:   Informed Consent: I have reviewed the patients History and Physical, chart, labs and discussed the procedure including the risks, benefits and alternatives for the proposed anesthesia with the patient or authorized representative who has indicated his/her understanding and acceptance.     Dental advisory given  Plan Discussed with: CRNA  Anesthesia Plan Comments:         Anesthesia Quick Evaluation

## 2023-08-13 ENCOUNTER — Ambulatory Visit (HOSPITAL_COMMUNITY): Payer: Medicare Other | Admitting: Anesthesiology

## 2023-08-13 ENCOUNTER — Encounter (HOSPITAL_COMMUNITY)
Admission: RE | Disposition: A | Discharge status: Home / Self Care | Payer: Self-pay | Source: Home / Self Care | Attending: Gastroenterology

## 2023-08-13 ENCOUNTER — Telehealth: Payer: Self-pay | Admitting: *Deleted

## 2023-08-13 ENCOUNTER — Ambulatory Visit (HOSPITAL_BASED_OUTPATIENT_CLINIC_OR_DEPARTMENT_OTHER): Payer: Medicare Other | Admitting: Anesthesiology

## 2023-08-13 ENCOUNTER — Encounter (HOSPITAL_COMMUNITY): Payer: Self-pay | Admitting: Gastroenterology

## 2023-08-13 ENCOUNTER — Ambulatory Visit (HOSPITAL_COMMUNITY)
Admission: RE | Admit: 2023-08-13 | Discharge: 2023-08-13 | Disposition: A | Discharge status: Home / Self Care | Payer: Medicare Other | Attending: Gastroenterology | Admitting: Gastroenterology

## 2023-08-13 DIAGNOSIS — K31819 Angiodysplasia of stomach and duodenum without bleeding: Secondary | ICD-10-CM

## 2023-08-13 DIAGNOSIS — K219 Gastro-esophageal reflux disease without esophagitis: Secondary | ICD-10-CM | POA: Diagnosis not present

## 2023-08-13 DIAGNOSIS — I700 Atherosclerosis of aorta: Secondary | ICD-10-CM | POA: Insufficient documentation

## 2023-08-13 DIAGNOSIS — Z87891 Personal history of nicotine dependence: Secondary | ICD-10-CM | POA: Insufficient documentation

## 2023-08-13 DIAGNOSIS — D569 Thalassemia, unspecified: Secondary | ICD-10-CM | POA: Insufficient documentation

## 2023-08-13 DIAGNOSIS — G4733 Obstructive sleep apnea (adult) (pediatric): Secondary | ICD-10-CM | POA: Diagnosis not present

## 2023-08-13 DIAGNOSIS — E119 Type 2 diabetes mellitus without complications: Secondary | ICD-10-CM | POA: Diagnosis not present

## 2023-08-13 DIAGNOSIS — K5520 Angiodysplasia of colon without hemorrhage: Secondary | ICD-10-CM | POA: Diagnosis not present

## 2023-08-13 DIAGNOSIS — D649 Anemia, unspecified: Secondary | ICD-10-CM

## 2023-08-13 DIAGNOSIS — Z7901 Long term (current) use of anticoagulants: Secondary | ICD-10-CM | POA: Insufficient documentation

## 2023-08-13 DIAGNOSIS — K5521 Angiodysplasia of colon with hemorrhage: Secondary | ICD-10-CM | POA: Diagnosis not present

## 2023-08-13 DIAGNOSIS — Z9981 Dependence on supplemental oxygen: Secondary | ICD-10-CM | POA: Insufficient documentation

## 2023-08-13 DIAGNOSIS — M069 Rheumatoid arthritis, unspecified: Secondary | ICD-10-CM | POA: Insufficient documentation

## 2023-08-13 DIAGNOSIS — J449 Chronic obstructive pulmonary disease, unspecified: Secondary | ICD-10-CM | POA: Insufficient documentation

## 2023-08-13 DIAGNOSIS — K921 Melena: Secondary | ICD-10-CM | POA: Insufficient documentation

## 2023-08-13 DIAGNOSIS — K922 Gastrointestinal hemorrhage, unspecified: Secondary | ICD-10-CM

## 2023-08-13 DIAGNOSIS — D500 Iron deficiency anemia secondary to blood loss (chronic): Secondary | ICD-10-CM | POA: Insufficient documentation

## 2023-08-13 DIAGNOSIS — E876 Hypokalemia: Secondary | ICD-10-CM | POA: Diagnosis not present

## 2023-08-13 DIAGNOSIS — K449 Diaphragmatic hernia without obstruction or gangrene: Secondary | ICD-10-CM

## 2023-08-13 DIAGNOSIS — Z794 Long term (current) use of insulin: Secondary | ICD-10-CM | POA: Diagnosis not present

## 2023-08-13 DIAGNOSIS — I509 Heart failure, unspecified: Secondary | ICD-10-CM | POA: Insufficient documentation

## 2023-08-13 DIAGNOSIS — I480 Paroxysmal atrial fibrillation: Secondary | ICD-10-CM | POA: Insufficient documentation

## 2023-08-13 DIAGNOSIS — I110 Hypertensive heart disease with heart failure: Secondary | ICD-10-CM | POA: Insufficient documentation

## 2023-08-13 DIAGNOSIS — I050 Rheumatic mitral stenosis: Secondary | ICD-10-CM | POA: Diagnosis not present

## 2023-08-13 DIAGNOSIS — I25119 Atherosclerotic heart disease of native coronary artery with unspecified angina pectoris: Secondary | ICD-10-CM | POA: Diagnosis not present

## 2023-08-13 HISTORY — PX: HOT HEMOSTASIS: SHX5433

## 2023-08-13 HISTORY — PX: BALLOON ENTEROSCOPY: SHX6863

## 2023-08-13 LAB — GLUCOSE, CAPILLARY
Glucose-Capillary: 103 mg/dL — ABNORMAL HIGH (ref 70–99)
Glucose-Capillary: 127 mg/dL — ABNORMAL HIGH (ref 70–99)

## 2023-08-13 SURGERY — ENTEROSCOPY, USING BALLOON
Anesthesia: Monitor Anesthesia Care

## 2023-08-13 MED ORDER — PROPOFOL 10 MG/ML IV BOLUS
INTRAVENOUS | Status: AC
  Filled 2023-08-13: qty 20

## 2023-08-13 MED ORDER — PROPOFOL 500 MG/50ML IV EMUL
INTRAVENOUS | Status: DC | PRN
  Administered 2023-08-13: 600 mg via INTRAVENOUS

## 2023-08-13 MED ORDER — PROPOFOL 500 MG/50ML IV EMUL
INTRAVENOUS | Status: AC
Start: 2023-08-13 — End: ?
  Filled 2023-08-13: qty 50

## 2023-08-13 MED ORDER — SODIUM CHLORIDE 0.9 % IV SOLN: INTRAVENOUS | Status: DC

## 2023-08-13 MED ORDER — LIDOCAINE HCL (CARDIAC) PF 100 MG/5ML IV SOSY
PREFILLED_SYRINGE | INTRAVENOUS | Status: DC | PRN
  Administered 2023-08-13 (×2): 50 mg via INTRAVENOUS

## 2023-08-13 MED ORDER — PHENYLEPHRINE HCL (PRESSORS) 10 MG/ML IV SOLN
INTRAVENOUS | Status: DC | PRN
  Administered 2023-08-13: 80 ug via INTRAVENOUS

## 2023-08-13 MED ORDER — SODIUM CHLORIDE 0.9 % IV SOLN: INTRAVENOUS | Status: DC | PRN

## 2023-08-13 NOTE — Discharge Instructions (Signed)

## 2023-08-13 NOTE — Anesthesia Postprocedure Evaluation (Signed)
Anesthesia Post Note  Patient: Lynn [name]  Procedure(s) Performed: BALLOON ENTEROSCOPY HOT HEMOSTASIS (ARGON PLASMA COAGULATION/BICAP)     Patient location during evaluation: PACU Anesthesia Type: MAC Level of consciousness: awake and alert Pain management: pain level controlled Vital Signs Assessment: post-procedure vital signs reviewed and stable Respiratory status: spontaneous breathing Cardiovascular status: stable Anesthetic complications: no   No notable events documented.  Last Vitals:  Vitals:   08/13/23 0841 08/13/23 0850  BP: (!) 123/47 (!) 120/38  Pulse: 71 70  Resp: 17 19  Temp:    SpO2: 98% 96%    Last Pain:  Vitals:   08/13/23 0850  TempSrc:   PainSc: 0-No pain                 Lewie Loron

## 2023-08-13 NOTE — Transfer of Care (Signed)
Immediate Anesthesia Transfer of Care Note  Patient: Lynn [name]  Procedure(s) Performed: BALLOON ENTEROSCOPY HOT HEMOSTASIS (ARGON PLASMA COAGULATION/BICAP)  Patient Location: PACU  Anesthesia Type:MAC  Level of Consciousness: awake and alert   Airway & Oxygen Therapy: Patient Spontanous Breathing and Patient connected to face mask oxygen  Post-op Assessment: Report given to RN and Post -op Vital signs reviewed and stable  Post vital signs: Reviewed and stable  Last Vitals:  Vitals Value Taken Time  BP 103/39 08/13/23 0831  Temp 36.1 C 08/13/23 0831  Pulse 69 08/13/23 0835  Resp 20 08/13/23 0835  SpO2 100 % 08/13/23 0835  Vitals shown include unfiled device data.  Last Pain:  Vitals:   08/13/23 0831  TempSrc: Temporal  PainSc:          Complications: No notable events documented.

## 2023-08-13 NOTE — Telephone Encounter (Signed)
-----   Message from Zeba T. Russella Dar sent at 08/13/2023  9:14 AM EDT ----- Thank you VC! We will see how she does. ----- Message ----- From: Shellia Cleverly, DO Sent: 08/13/2023   8:54 AM EDT To: Meryl Dare, MD; Richardson Chiquito, RN  Single balloon enteroscopy completed today.  Was able to advance approximately 40 cm beyond the previously placed tattoos, which was about 140-150 cm distal to the pylorus.  Found 2 small nonbleeding AVMs, both treated with APC.  These seem to be in the correct location compared with the recent VCE findings.  No active bleeding or blood in the upper GI lumen.  I made several passes in the area distal to the tattoos and no other bleeding sources identified.  Hopefully this does the trick, but certainly could have a new AVM pop up.  Will restart her Eliquis tomorrow.  Plan for repeat CBC check next week and probably periodic CBC monitoring.  If rebleed, would probably start with repeat VCE to localize and either repeat single balloon or referral to [facility] for double-balloon enteroscopy.  VC

## 2023-08-13 NOTE — Telephone Encounter (Signed)
CBC order entered in EPIC for 08/20/23. Will remind pt of labs next week.

## 2023-08-13 NOTE — Interval H&P Note (Signed)
History and Physical Interval Note:   Repeat H/H on 10/14 was 9.2/30.7.  Has been holding Eliquis x 2 days for procedure today.  08/13/2023 7:29 AM  Faylene Kurtz  has presented today for surgery, with the diagnosis of small bowel bleed; anemia.  The various methods of treatment have been discussed with the patient and family. After consideration of risks, benefits and other options for treatment, the patient has consented to  Procedure(s): BALLOON ENTEROSCOPY (N/A) as a surgical intervention.  The patient's history has been reviewed, patient examined, no change in status, stable for surgery.  I have reviewed the patient's chart and labs.  Questions were answered to the patient's satisfaction.     Verlin Dike Danile Trier

## 2023-08-13 NOTE — Op Note (Signed)
[facility] Patient Name: Lynn [name] Procedure Date: 08/13/2023 MRN: [id number] Attending MD: Doristine Locks , MD, [id number] Date of Birth: Jul 27, 1956 CSN: [id number] Age: 67 Admit Type: Outpatient Procedure:                Small bowel enteroscopy (single balloon enteroscopy) Indications:              Abnormal video capsule endoscopy, Iron deficiency                            anemia secondary to chronic blood loss,                            Arteriovenous malformation in the small intestine Providers:                Doristine Locks, MD, Janae Sauce. Steele Berg, RN, Kandice Robinsons, Technician, Geoffery Lyons, Technician, Suzy Bouchard, RN Referring MD:              Medicines:                Monitored Anesthesia Care Complications:            No immediate complications. Estimated Blood Loss:     Estimated blood loss was minimal. Procedure:                Pre-Anesthesia Assessment:                           - Prior to the procedure, a History and Physical                            was performed, and patient medications and                            allergies were reviewed. The patient's tolerance of                            previous anesthesia was also reviewed. The risks                            and benefits of the procedure and the sedation                            options and risks were discussed with the patient.                            All questions were answered, and informed consent                            was obtained. Prior Anticoagulants: The patient has  taken Eliquis (apixaban), last dose was 2 days                            prior to procedure. ASA Grade Assessment: IV - A                            patient with severe systemic disease that is a                            constant threat to life. After reviewing the risks                            and benefits, the  patient was deemed in                            satisfactory condition to undergo the procedure.                           After obtaining informed consent, the endoscope was                            passed under direct vision. Throughout the                            procedure, the patient's blood pressure, pulse, and                            oxygen saturations were monitored continuously. The                            [id number]-Q180 ([id number]) Olympus enteroscope was                            introduced through the mouth and advanced deep into                            the small bowel. When the endoscope could not be                            advanced any further, the overtube was advanced and                            the balloon was engaged. Through a series of                            balloon inflation/deflation and enteroscope                            advancement/withdrawal, the enteroscope was                            advanced further deep into the small bowel,  advancing to the mid jejunum, approximately 140 cm                            distal to the pylorus. The small bowel enteroscopy                            was accomplished without difficulty. The patient                            tolerated the procedure well. Scope In: Scope Out: Findings:      A small, sliding hiatal hernia was present.      The entire examined stomach was normal.      There was no evidence of significant pathology in the entire examined       duodenum.      Two separate tattoos were seen in the proximal jejunum. Each of the       surrounding areas was normal-appearing. The enteroscope was advanced       approximately 40 cm beyond the distal tattoo into the mid jejunum.      Two angioectasias with no bleeding were found in the proximal jejunum       and in the mid-jejunum. Coagulation for hemostasis using argon plasma       was successful. Estimated blood loss:  none. Impression:               - Small hiatal hernia.                           - Normal stomach.                           - Normal examined duodenum.                           - A tattoo was seen in the jejunum.                           - Two non-bleeding angioectasias in the jejunum.                            Treated with argon plasma coagulation (APC).                           - No active bleeding or blood in the examined small                            bowel.                           - No specimens collected. Recommendation:           - The patient will be observed post-procedure,                            until all discharge criteria are met.                           -  Advance diet as tolerated.                           - Resume Eliquis (apixaban) at prior dose tomorrow.                           - Return to GI clinic at appointment to be                            scheduled.                           - Repeat CBC next week and continue serial CBC                            checks and monitoring for evidence of rebleeding.                           - If rebleeding, may need [facility]. Procedure Code(s):        --- Professional ---                           [phone], Small intestinal endoscopy, enteroscopy                            beyond second portion of duodenum, not including                            ileum; with control of bleeding (eg, injection,                            bipolar cautery, unipolar cautery, laser, heater                            probe, stapler, plasma coagulator) Diagnosis Code(s):        --- Professional ---                           K44.9, Diaphragmatic hernia without obstruction or                            gangrene                           K55.20, Angiodysplasia of colon without hemorrhage                            D50.0, Iron deficiency anemia secondary to blood  loss (chronic)                           K31.819, Angiodysplasia of stomach and duodenum                            without bleeding                           R93.3, Abnormal findings on diagnostic imaging of                            other parts of digestive tract CPT copyright 2022 [employer]. All rights reserved. The codes documented in this report are preliminary and upon coder review may  be revised to meet current compliance requirements. Doristine Locks, MD 08/13/2023 8:45:25 AM Number of Addenda: 0

## 2023-08-22 ENCOUNTER — Other Ambulatory Visit: Payer: Self-pay | Admitting: Cardiology

## 2023-08-27 ENCOUNTER — Encounter: Payer: Self-pay | Admitting: Physician Assistant

## 2023-08-27 ENCOUNTER — Ambulatory Visit: Payer: Medicare Other | Attending: Physician Assistant | Admitting: Physician Assistant

## 2023-08-27 VITALS — BP 128/64 | HR 87 | Ht 63.0 in | Wt 234.4 lb

## 2023-08-27 DIAGNOSIS — I2510 Atherosclerotic heart disease of native coronary artery without angina pectoris: Secondary | ICD-10-CM | POA: Diagnosis not present

## 2023-08-27 DIAGNOSIS — E785 Hyperlipidemia, unspecified: Secondary | ICD-10-CM

## 2023-08-27 DIAGNOSIS — I10 Essential (primary) hypertension: Secondary | ICD-10-CM

## 2023-08-27 DIAGNOSIS — I480 Paroxysmal atrial fibrillation: Secondary | ICD-10-CM

## 2023-08-27 DIAGNOSIS — E1169 Type 2 diabetes mellitus with other specified complication: Secondary | ICD-10-CM

## 2023-08-27 DIAGNOSIS — R195 Other fecal abnormalities: Secondary | ICD-10-CM

## 2023-08-27 DIAGNOSIS — J849 Interstitial pulmonary disease, unspecified: Secondary | ICD-10-CM | POA: Diagnosis not present

## 2023-08-27 DIAGNOSIS — I3481 Nonrheumatic mitral (valve) annulus calcification: Secondary | ICD-10-CM

## 2023-08-27 MED ORDER — APIXABAN 5 MG PO TABS: 5.0000 mg | ORAL_TABLET | Freq: Two times a day (BID) | ORAL | 3 refills | Status: DC

## 2023-08-27 MED ORDER — ATORVASTATIN CALCIUM 80 MG PO TABS: 80.0000 mg | ORAL_TABLET | Freq: Every evening | ORAL | 3 refills | Status: DC

## 2023-08-27 MED ORDER — LOSARTAN POTASSIUM 100 MG PO TABS: 50.0000 mg | ORAL_TABLET | Freq: Every day | ORAL | 3 refills | Status: DC

## 2023-08-27 MED ORDER — DILTIAZEM HCL ER COATED BEADS 360 MG PO CP24: 360.0000 mg | ORAL_CAPSULE | Freq: Every day | ORAL | 1 refills | Status: DC

## 2023-08-27 MED ORDER — FUROSEMIDE 20 MG PO TABS: 20.0000 mg | ORAL_TABLET | ORAL | 6 refills | Status: DC

## 2023-08-27 MED ORDER — ISOSORBIDE MONONITRATE ER 30 MG PO TB24: 30.0000 mg | ORAL_TABLET | Freq: Every day | ORAL | 3 refills | Status: DC

## 2023-08-27 NOTE — Patient Instructions (Signed)
Medication Instructions:  NO CHANGES *If you need a refill on your cardiac medications before your next appointment, please call your pharmacy*   Lab Work: NO LABS If you have labs (blood work) drawn today and your tests are completely normal, you will receive your results only by: MyChart Message (if you have MyChart) OR A paper copy in the mail If you have any lab test that is abnormal or we need to change your treatment, we will call you to review the results.   Testing/Procedures: NO TESTING   Follow-Up: At [facility], you and your health needs are our priority.  As part of our continuing mission to provide you with exceptional heart care, we have created designated Provider Care Teams.  These Care Teams include your primary Cardiologist (physician) and Advanced Practice Providers (APPs -  Physician Assistants and Nurse Practitioners) who all work together to provide you with the care you need, when you need it.   Your next appointment:   9-12 month(s)  Provider:   Bryan Lemma, MD

## 2023-08-27 NOTE — Progress Notes (Signed)
Cardiology Office Note:  .   Date:  08/27/2023  ID:  Lynn [name], DOB [birth year], MRN [id number] PCP: Pearline Cables, MD  [facility] Providers Cardiologist:  Bryan Lemma, MD Cardiology APP:  Marcelino Duster, PA     History of Present Illness: .   Lynn [name] is a 67 y.o. female with PMH of nonobstructive CAD, presumed coronary vasospasm, mitral stenosis, anxiety, stomach ulcers, hypertension, hyperlipidemia, DM II, former tobacco smoker, macrocytic anemia, OSA, CKD stage III, seizure, thalassemia, and PAD.  Previous carotid catheterization performed in February 2018 demonstrated 40 to 50% LAD, 30 to 50% proximal LAD, inferolateral calcified mass felt to reflect MAC.  She had runs of SVT.  Follow-up heart monitor showed mostly sinus rhythm, multifocal PVCs with couplets and bigeminy, 7 PAT runs.  She was treated for coronary spasm with diltiazem and Imdur.  Echocardiogram in October 2018 demonstrated heavy posterior MAC with moderate stenosis.  She was hospitalized in June 2022 was fever, weight loss and atypical chest pain.  CTA negative for PE.  COVID and respiratory viral panel was negative.  She required oxygen for respiratory distress.  Echocardiogram at the time showed EF 60 to 65%, moderate LVH, grade 2 DD, MAC with moderate mitral stenosis, mass on the posterior annulus.  EKG showed new atrial fibrillation for 3 hours then converted back to sinus rhythm.  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  TEE showed large mass adherent to the posterior annulus, fixed posterior leaflet, moderate to severe MR, posterior lateral mitral annulus mass consistent with degenerative MAC.  She has seen by pulmonology service for possible pneumonitis.  She was treated with Bactrim and a prolonged steroid taper.  Blood culture were negative.  She had a positive Hemoccult but H&H remained stable.  GI service performed EGD that showed nonbleeding esophageal erosion/ulcer  and a 2 nonbleeding small gastric ulcers.  She was discharged on Protonix.  ZIO monitor in August 2022 showed A-fib burden less than 1%, 30 episodes of SVT.  She was admitted at [facility], ED in January 2023 for syncopal event that was felt to be vasovagal in the setting of dehydration from diuretic use.  Lasix was changed to as needed.  Heart monitor shows no true SVT, no A-fib or a flutter, 43 atrial runs, no significant arrhythmia to explain the syncope.  She was last seen by Dr. Herbie Baltimore in August 2023 at which time she was doing well.  There was no plan to start on rhythm control unless her A-fib become more frequent.  Subsequent echocardiogram obtained on 11/30/2022 showed EF 60 to 65%, grade 1 DD, moderate LVE, normal RV, large calcified mass at the posterior mitral annulus likely exuberant mitral annular calcification, no evidence of mitral valve regurgitation, moderate mitral valve stenosis.  Since the last visit, patient has been admitted multiple times for severe anemia.  She was admitted in March 2020 for with severe anemia.  Hemoglobin was down to 8.2.  Patient was instructed to hold Eliquis until the day after discharge.  She was readmitted in October 2024 with black tarry stool.  Hemoglobin was down to 7.1.  She was started on IV Protonix.  GI service consulted, EGD performed on 10/2 was normal.  Capsule endoscopy was performed which showed active small bowel bleeding, however the lesion was beyond the reach of standard endoscope.  The case was discussed with [facility] who recommended resume Eliquis, if patient has recurrent bleed, will transfer to [facility] for further GI  workup.  Repeat endoscopy performed on 10/29 by Dr. Barron Alvine showed 2 nonbleeding angioectasias in the jejunum, no active bleeding was found in the examined small bowel.  Patient presents today for follow-up.  She has not had any major bleeding issue in the past few weeks.  She has no significant lower extremity edema, orthopnea or PND.   She is taking 20 mg daily of Lasix and with additional 20 mg as needed if she has increased leg edema.  Her lung is clear on exam.  She is on 3 L 24/7 oxygen due to interstitial lung disease.  She is being followed by her pulmonologist service.  She has not had any lipid panel since 2021, she plans to see her PCP in the next 2 weeks, she will get fasting lipid panel there.  Overall, she has been doing well from the cardiac perspective and can follow-up in 9 to 12 months with Dr. Herbie Baltimore.  ROS:   She denies chest pain, palpitations, dyspnea, pnd, orthopnea, n, v, dizziness, syncope, edema, weight gain, or early satiety. All other systems reviewed and are otherwise negative except as noted above.    Studies Reviewed: .        Cardiac Studies & Procedures   CARDIAC CATHETERIZATION  CARDIAC CATHETERIZATION 08/08/2021  Narrative   Ost Cx lesion is 40% stenosed.   Ost RCA to Prox RCA lesion is 35% stenosed.   Prox LAD to Mid LAD lesion is 25% stenosed with 25% stenosed side branch in 2nd Diag.   Mid LAD to Dist LAD lesion is 30% stenosed.   LV end diastolic pressure is severely elevated.   Hemodynamic findings consistent with mild pulmonary hypertension.   There is no aortic valve stenosis.  SUMMARY Minimal Coronary Artery Disease Mild Secondary Pulmonry Hypertension (Pulmonary Venous Hypertension) -> PA mean 31 mmHg with a PCWP and LVEDP of 23 mmHg. Known moderate to severe mitral stenosis without significant PCWP elevation. Cardiac output-index 6.43-3.0.  Borderline.   RECOMMENDATIONS Gave 40 mg IV Lasix in the Cath Lab. Will initiate Lasix 40 mg daily oral Blood pressures at home were not as high as they are here, therefore we will continue current dose of losartan and diltiazem. Continue to optimize medical therapy.  Not yet at stage to refer for mitral valve surgery.   Bryan Lemma, MD  Findings Coronary Findings Diagnostic  Dominance: Right  Left Main Vessel is  large.  Left Anterior Descending There is mild diffuse disease throughout the vessel. Prox LAD to Mid LAD lesion is 25% stenosed with 25% stenosed side branch in 2nd Diag. The lesion is segmental. The lesion is calcified. Mid LAD to Dist LAD lesion is 30% stenosed. The lesion is segmental. The lesion is calcified.  First Diagonal Branch Vessel is small in size.  Second Diagonal Branch Vessel is small in size.  Third Diagonal Branch Vessel is small in size.  Left Circumflex Vessel is large. The vessel exhibits minimal luminal irregularities. Ost Cx lesion is 40% stenosed.  First Obtuse Marginal Branch Vessel is small in size.  First Left Posterolateral Branch Vessel is small in size.  Left Posterior Atrioventricular Artery Vessel is small in size.  Right Coronary Artery Vessel was injected. Vessel is large. There is mild diffuse disease throughout the vessel. Ost RCA to Prox RCA lesion is 35% stenosed. The lesion is segmental. The lesion is calcified.  Acute Marginal Branch Vessel is small in size.  Right Ventricular Branch Vessel is small in size.  Right Posterior Descending  Artery Vessel is small in size.  First Right Posterolateral Branch Vessel is small in size.  Intervention  No interventions have been documented.   CARDIAC CATHETERIZATION  CARDIAC CATHETERIZATION 11/19/2016  Narrative  Moderate to heavy coronary calcification.  Segmental 40-50% LAD narrowing.  30-50% proximal to mid RCA.  Coronaries are widely patent without high-grade stenosis.  Fluoroscopy demonstration of inferolateral calcified mass, likely representing mitral annular calcification. Consider CT or MRI if there is not heavy mitral valve calcification on echocardiography.  Spontaneous runs of supraventricular tachycardia during the procedure.   RECOMMENDATIONS:   No significant obstructive coronary disease is identified.  Etiology of enzyme elevation and clinical  presentation could potentially be supraventricular arrhythmia superimposed on the cardiac substrate noted above.  Findings Coronary Findings Diagnostic  Dominance: Right  Left Anterior Descending The lesion is segmental. The lesion is calcified. The lesion is calcified.  First Diagonal Branch Vessel is small in size.  Second Diagonal Branch Vessel is small in size.  Left Circumflex  Second Obtuse Marginal Branch Vessel is small in size.  Third Obtuse Marginal Branch Vessel is small in size.  Right Coronary Artery The lesion is segmental. The lesion is calcified.  Right Posterior Descending Artery Vessel is small in size.  First Right Posterolateral Branch Vessel is small in size.  Intervention  No interventions have been documented.     ECHOCARDIOGRAM  ECHOCARDIOGRAM COMPLETE 11/30/2022  Narrative ECHOCARDIOGRAM REPORT    Patient Name:   Lynn [name] Date of Exam: 11/30/2022 Medical Rec #:  [id number]               Height:       63.0 in Accession #:    [id number]              Weight:       241.2 lb Date of Birth:  1956-01-25                BSA:          2.094 m Patient Age:    66 years                BP:           132/72 mmHg Patient Gender: F                       HR:           90 bpm. Exam Location:  [address]  Procedure: 2D Echo, Cardiac Doppler, Color Doppler and Intracardiac Opacification Agent  Indications:    I34.2 Nonrheumatic mitral (valve) stenosis  History:        Patient has prior history of Echocardiogram examinations, most recent 04/26/2022. CAD, Arrythmias:Atrial Fibrillation and PVC, Signs/Symptoms:Syncope; Risk Factors:Hypertension, Diabetes, Dyslipidemia, Sleep Apnea and Former Smoker. Paroxysmal atrial tachycardia. Morbid obesity. Dyspnea on exertion. Interstitial lung disease.  Sonographer:    Cathie Beams RCS Referring Phys: [phone] DAVID W HARDING  IMPRESSIONS   1. Left ventricular ejection fraction, by  estimation, is 60 to 65%. The left ventricle has normal function. The left ventricle has no regional wall motion abnormalities. There is mild concentric left ventricular hypertrophy. Left ventricular diastolic parameters are consistent with Grade I diastolic dysfunction (impaired relaxation). 2. Right ventricular systolic function is normal. The right ventricular size is normal. 3. Left atrial size was moderately dilated. 4. Large calcified mass at posterior mitral annulus is likely exuberant mitral annular calcification. The mitral valve is degenerative. No evidence of mitral  valve regurgitation. Moderate mitral stenosis. The mean mitral valve gradient is 12.0 mmHg, MVA 1.5 cm^2. Severe mitral annular calcification. 5. The aortic valve is tricuspid. There is moderate calcification of the aortic valve. Aortic valve regurgitation is not visualized. Aortic valve sclerosis/calcification is present, without any evidence of aortic stenosis. 6. The inferior vena cava is normal in size with greater than 50% respiratory variability, suggesting right atrial pressure of 3 mmHg.  FINDINGS Left Ventricle: Left ventricular ejection fraction, by estimation, is 60 to 65%. The left ventricle has normal function. The left ventricle has no regional wall motion abnormalities. The left ventricular internal cavity size was normal in size. There is mild concentric left ventricular hypertrophy. Left ventricular diastolic parameters are consistent with Grade I diastolic dysfunction (impaired relaxation).  Right Ventricle: The right ventricular size is normal. No increase in right ventricular wall thickness. Right ventricular systolic function is normal.  Left Atrium: Left atrial size was moderately dilated.  Right Atrium: Right atrial size was normal in size.  Pericardium: There is no evidence of pericardial effusion.  Mitral Valve: Large calcified mass at posterior mitral annulus is likely exuberant mitral annular  calcification. The mitral valve is degenerative in appearance. There is moderate calcification of the mitral valve leaflet(s). Severe mitral annular calcification. No evidence of mitral valve regurgitation. Moderate mitral valve stenosis. MV peak gradient, 22.6 mmHg. The mean mitral valve gradient is 12.0 mmHg.  Tricuspid Valve: The tricuspid valve is normal in structure. Tricuspid valve regurgitation is trivial.  Aortic Valve: The aortic valve is tricuspid. There is moderate calcification of the aortic valve. Aortic valve regurgitation is not visualized. Aortic valve sclerosis/calcification is present, without any evidence of aortic stenosis.  Pulmonic Valve: The pulmonic valve was normal in structure. Pulmonic valve regurgitation is not visualized.  Aorta: The aortic root is normal in size and structure.  Venous: The inferior vena cava is normal in size with greater than 50% respiratory variability, suggesting right atrial pressure of 3 mmHg.  IAS/Shunts: No atrial level shunt detected by color flow Doppler.   LEFT VENTRICLE PLAX 2D LVIDd:         3.80 cm   Diastology LVIDs:         2.20 cm   LV e' medial:    9.29 cm/s LV PW:         1.30 cm   LV E/e' medial:  17.8 LV IVS:        1.10 cm   LV e' lateral:   7.14 cm/s LVOT diam:     2.00 cm   LV E/e' lateral: 23.1 LV SV:         94 LV SV Index:   45 LVOT Area:     3.14 cm   RIGHT VENTRICLE RV S prime:     16.60 cm/s TAPSE (M-mode): 3.0 cm  LEFT ATRIUM             Index        RIGHT ATRIUM           Index LA diam:        3.40 cm 1.62 cm/m   RA Area:     11.20 cm LA Vol (A2C):   55.1 ml 26.31 ml/m  RA Volume:   21.30 ml  10.17 ml/m LA Vol (A4C):   95.4 ml 45.56 ml/m LA Biplane Vol: 73.5 ml 35.10 ml/m AORTIC VALVE LVOT Vmax:   135.67 cm/s LVOT Vmean:  95.933 cm/s LVOT VTI:    0.300  m  AORTA Ao Root diam: 3.10 cm Ao Asc diam:  2.70 cm  MITRAL VALVE MV Area (PHT): 2.65 cm     SHUNTS MV Area VTI:   1.35 cm      Systemic VTI:  0.30 m MV Peak grad:  22.6 mmHg    Systemic Diam: 2.00 cm MV Mean grad:  12.0 mmHg MV Vmax:       2.38 m/s MV Vmean:      166.0 cm/s MV Decel Time: 287 msec MV E velocity: 165.00 cm/s MV A velocity: 200.67 cm/s MV E/A ratio:  0.82  Dalton McleanMD Electronically signed by Wilfred Lacy Signature Date/Time: 11/30/2022/2:50:56 PM    Final   TEE  ECHO TEE 03/28/2021  Narrative TRANSESOPHOGEAL ECHO REPORT    Patient Name:   Lynn [name] Date of Exam: 03/28/2021 Medical Rec #:  [id number]               Height:       64.0 in Accession #:    [id number]              Weight:       238.8 lb Date of Birth:  06/07/1956                BSA:          2.109 m Patient Age:    65 years                BP:           118/55 mmHg Patient Gender: F                       HR:           94 bpm. Exam Location:  Inpatient  Procedure: 3D Echo, Transesophageal Echo, Cardiac Doppler and Color Doppler  Indications:     I34.8 Other nonrheumatic mitral valve disorders  History:         Patient has prior history of Echocardiogram examinations, most recent 03/24/2021. CAD, Signs/Symptoms:Murmur and Syncope; Risk Factors:Hypertension, Diabetes, Dyslipidemia and Sleep Apnea.  Sonographer:     Elmarie Shiley Dance Referring Phys:  [id number] Q [phone] Diagnosing Phys: Kristeen Miss MD  PROCEDURE: The transesophogeal probe was passed without difficulty through the esophogus of the patient. Sedation performed by different physician. The patient was monitored while under deep sedation. Anesthestetic sedation was provided intravenously by Anesthesiology: 130mg  of Propofol, 60mg  of Lidocaine. Patients was under conscious sedation during this procedure. Anesthetic administered: of Fentanyl. The patient developed no complications during the procedure.  IMPRESSIONS   1. Left ventricular ejection fraction, by estimation, is 55 to 60%. The left ventricle has normal function. The left ventricle  has no regional wall motion abnormalities. 2. Right ventricular systolic function was not well visualized. The right ventricular size is not well visualized. 3. No left atrial/left atrial appendage thrombus was detected. 4. 3-D images of the mitral annular mass were obtained. There is a large circumscribed mass adherent to the posterior annulus. The posterior leaflet is fixed . There is moderate - severe mitral stenosis. The mass is large , measuring 1.8 x 1.4 cm. Differential diagnosis includes myxoma, exuberant mitral annular calcification, myxoma, or other primary cardiac tumor. suggest Cardiac MRI for further evaluation . This mass was present on transthoracic echo from 2018. . The mitral valve is abnormal. Mild mitral valve regurgitation. Moderate to severe mitral stenosis. Severe mitral annular calcification. 5. The aortic valve is  tricuspid. Aortic valve regurgitation is not visualized. No aortic stenosis is present. 6. There is mild (Grade II) plaque.  FINDINGS Left Ventricle: Left ventricular ejection fraction, by estimation, is 55 to 60%. The left ventricle has normal function. The left ventricle has no regional wall motion abnormalities. The left ventricular internal cavity size was normal in size.  Right Ventricle: The right ventricular size is not well visualized. Right vetricular wall thickness was not well visualized. Right ventricular systolic function was not well visualized.  Left Atrium: Left atrial size was normal in size. No left atrial/left atrial appendage thrombus was detected.  Right Atrium: Right atrial size was normal in size.  Pericardium: There is no evidence of pericardial effusion.  Mitral Valve: 3-D images of the mitral annular mass were obtained. There is a large circumscribed mass adherent to the posterior annulus. The posterior leaflet is fixed . There is moderate - severe mitral stenosis. The mass is large , measuring 1.8 x 1.4 cm. Differential diagnosis  includes myxoma, exuberant mitral annular calcification, myxoma, or other primary cardiac tumor. suggest Cardiac MRI for further evaluation . This mass was present on transthoracic echo from 2018. The mitral valve is abnormal. Severe mitral annular calcification. Mild mitral valve regurgitation. Moderate to severe mitral valve stenosis. MV peak gradient, 19.9 mmHg. The mean mitral valve gradient is 14.0 mmHg.  Tricuspid Valve: The tricuspid valve is grossly normal. Tricuspid valve regurgitation is mild.  Aortic Valve: The aortic valve is tricuspid. Aortic valve regurgitation is not visualized. No aortic stenosis is present.  Pulmonic Valve: The pulmonic valve was grossly normal. Pulmonic valve regurgitation is not visualized.  Aorta: The aortic root and ascending aorta are structurally normal, with no evidence of dilitation. There is mild (Grade II) plaque.  IAS/Shunts: The atrial septum is grossly normal.   MITRAL VALVE MV Peak grad: 19.9 mmHg MV Mean grad: 14.0 mmHg MV Vmax:      2.23 m/s MV Vmean:     178.0 cm/s  Kristeen Miss MD Electronically signed by Kristeen Miss MD Signature Date/Time: 03/28/2021/4:14:10 PM    Final   MONITORS  LONG TERM MONITOR-LIVE TELEMETRY (3-14 DAYS) 12/08/2021  Narrative  Zio patch Wear Time: 12 days and 17 hours (2023-02-05T10:04:51-0500 to 2023-02-18T03:22:11-498)  Predominant rhythm is sinus with a rate range of 57 to 123 bpm. Average 77 bpm.  2 short runs of NSVT (PVCs): Fastest 5 beats 169 bpm, longest 12 beats 130 bpm.  43 atrial runs: Fastest 5 beats rate 176 bpm, longest 13 beats with a rate of 93 bpm.  Frequent isolated PACs (8.4%) with rare couplets and triplets.  Rare isolated PVCs with rare couplets.  No sustained arrhythmias: No evidence of atrial fibrillation or atrial flutter. No true SVT. No sustained VT.  No significant bradycardia or pauses. Previously posttermination pauses noted after atrial and ventricular runs.  No  significant arrhythmias to explain syncope.  Bryan Lemma, MD    CARDIAC MRI  MR CARDIAC MORPHOLOGY W WO CONTRAST 03/30/2021  Narrative CLINICAL DATA:  Cardiac mass  EXAM: CARDIAC MRI  TECHNIQUE: The patient was scanned on a 1.5 Tesla GE magnet. A dedicated cardiac coil was used. Functional imaging was done using Fiesta sequences. 2,3, and 4 chamber views were done to assess for RWMA's. Modified Simpson's rule using a short axis stack was used to calculate an ejection fraction on a dedicated work [employer]. The patient received 10 cc of Gadavist. After 10 minutes inversion recovery sequences were used to assess for  infiltration and scar tissue.  CONTRAST:  10 cc  of Gadavist  FINDINGS: 1. Normal left ventricular size, thickness and systolic function (LVEF = 53%). There are no regional wall motion abnormalities.  There is no late gadolinium enhancement in the left ventricular myocardium.  There is a 21 x 19 mm mass at the level of the mitral annulus, posterior lateral segment. The mass was hypointense on T1, T2-weighted and short tau inversion recovery sequences.  The mass did not show any contrast uptake or late gadolinium enhancement highly suggestive of degenerative mitral annular calcification.  LVEDV: 146 ml  LVESV: 68 ml  SV: 77 ml  CO: 5L/min  Myocardial mass: 113g  2. Normal right ventricular size, thickness and systolic function (RVEF = 48%). There are no regional wall motion abnormalities.  3.  Normal left and right atrial size.  4. Normal size of the aortic root, ascending aorta and pulmonary artery.  5.  Mild mitral regurgitation.  6.  Normal pericardium.  No pericardial effusion.  IMPRESSION: 1.  Normal right and left ventricular size and function, LVEF 53%.  2. Posterolateral mitral annulus mass consistent with degenerative mitral annular calcification (hypointense on T1, T2 and STIR sequences, no LGE).  3. I  reviewed today's High resolution chest CT which confirmed calcification of mitral annular mass as above.  4.  No scar or late gadolinium enhancement noted on LV myocardium.  Debbe Odea   Electronically Signed By: Debbe Odea M.D. On: 03/30/2021 16:09         Risk Assessment/Calculations:    CHA2DS2-VASc Score = 6   This indicates a 9.7% annual risk of stroke. The patient's score is based upon: CHF History: 1 HTN History: 1 Diabetes History: 1 Stroke History: 0 Vascular Disease History: 1 Age Score: 1 Gender Score: 1            Physical Exam:   VS:  BP 128/64 (BP Location: Left Arm, Patient Position: Sitting, Cuff Size: Normal)   Pulse 87   Ht 5\' 3"  (1.6 m)   Wt 234 lb 6.4 oz (106.3 kg)   LMP  (LMP Unknown)   SpO2 98%   BMI 41.52 kg/m    Wt Readings from Last 3 Encounters:  08/27/23 234 lb 6.4 oz (106.3 kg)  08/13/23 233 lb (105.7 kg)  07/24/23 234 lb 12.6 oz (106.5 kg)    GEN: Well nourished, well developed in no acute distress NECK: No JVD; No carotid bruits CARDIAC: RRR, no murmurs, rubs, gallops RESPIRATORY:  Clear to auscultation without rales, wheezing or rhonchi  ABDOMEN: Soft, non-tender, non-distended EXTREMITIES:  No edema; No deformity   ASSESSMENT AND PLAN: .    Recurrent GI Bleeding Multiple hospital admissions due to symptomatic anemia and black tarry stool. Recent capsule endoscopy showed active small bowel bleeding. Lesion beyond the reach of standard endoscope. No active bleeding found on repeat endoscopy. -Continue Eliquis as prescribed. -If bleeding recurs, stop Eliquis and consult with GI doctor. -Transfer to [facility] for further GI workup if bleeding recurs.  CAD: No recent chest discomfort.  Atrial Fibrillation Occasional episodes, most recently during hospitalization with fluid overload. Currently in sinus rhythm. -Continue current cardiac medications including Eliquis, Lipitor, diltiazem, Lasix, Imdur, and losartan. -If  increased episodes of AFib occur, contact office for earlier follow-up.  Mitral Valve Calcification Known calcified mass at posterior mitral annulus. Echocardiogram shows no evidence of mitral valve regurgitation, moderate mitral valve stenosis. -Continue current cardiac medications. -Follow-up with Dr. Herbie Baltimore in 9-12 months, or  earlier if issues arise.  Hypertension: Blood pressure stable  Hyperlipidemia: Continue Lipitor.  She has not had any cholesterol blood work since 2021, she plans to obtain lipid panel when she see her PCP in 2 weeks  Interstitial lung disease: Followed by pulmonology service.  On 3 L oxygen 24/7.       Dispo: Follow-up with Dr. Herbie Baltimore in 9 to 35-month.  Signed, Azalee Course, PA

## 2023-10-04 ENCOUNTER — Other Ambulatory Visit: Payer: Self-pay | Admitting: Family Medicine

## 2023-10-04 DIAGNOSIS — F410 Panic disorder [episodic paroxysmal anxiety] without agoraphobia: Secondary | ICD-10-CM

## 2023-10-28 ENCOUNTER — Encounter: Payer: Self-pay | Admitting: Internal Medicine

## 2023-10-28 ENCOUNTER — Telehealth (INDEPENDENT_AMBULATORY_CARE_PROVIDER_SITE_OTHER): Payer: Medicare Other | Admitting: Internal Medicine

## 2023-10-28 VITALS — Ht 63.0 in

## 2023-10-28 DIAGNOSIS — Z794 Long term (current) use of insulin: Secondary | ICD-10-CM | POA: Diagnosis not present

## 2023-10-28 DIAGNOSIS — E1139 Type 2 diabetes mellitus with other diabetic ophthalmic complication: Secondary | ICD-10-CM

## 2023-10-28 DIAGNOSIS — E1159 Type 2 diabetes mellitus with other circulatory complications: Secondary | ICD-10-CM | POA: Diagnosis not present

## 2023-10-28 MED ORDER — TIRZEPATIDE 12.5 MG/0.5ML ~~LOC~~ SOAJ: 12.5000 mg | SUBCUTANEOUS | 3 refills | Status: DC

## 2023-10-28 MED ORDER — INSULIN LISPRO 100 UNIT/ML IJ SOLN: INTRAMUSCULAR | 3 refills | Status: DC

## 2023-10-28 MED ORDER — OMNIPOD 5 G7 PODS (GEN 5) MISC: 1.0000 | 3 refills | Status: DC

## 2023-10-28 NOTE — Progress Notes (Signed)
 Virtual Visit via Video Note  I connected with Lynn [name] on 10/28/2023 at 3 pm  by a video enabled telemedicine application and verified that I am speaking with the correct person using two identifiers.   I discussed the limitations of evaluation and management by telemedicine and the availability of in person appointments. The patient expressed understanding and agreed to proceed.   -Location of the patient : home -Location of the provider : Office -The names of all persons participating in the telemedicine service : Pt and myself         Name: Lynn [name]  Age/ Sex: 68 y.o., female   MRN/ DOB: [id number], 1956-10-13     PCP: Watt [name], MD   Reason for Endocrinology Evaluation: Type 2 Diabetes Mellitus  Initial Endocrine Consultative Visit: 03/31/2019    PATIENT IDENTIFIER: Lynn [name] is a 68 y.o. female with a past medical history of T2DM, HTN, dyslipidemia,thalassemia, OSA on CPAP . The patient has followed with Endocrinology clinic since 03/31/2019 for consultative assistance with management of her diabetes.  DIABETIC HISTORY:  [name] was diagnosed with T2DM in 2010, She has been on metformin  in the past with reported prior intolerance due to diarrhea. Was on Glipizide  at some point with no side effects.Has been on insulin  for years. Her hemoglobin A1c has ranged from  7.7% in 2018, peaking at 10.4% in 2020.  On her initial presentation to our clinic, her A1c was 10.4% , she was on Lantus  and Victoza     Farxiga  was tried in 03/2019 but developed severe dizziness .     Injectable methotrexate was started November 2020 due to rheumatoid arthritis.  Patient is intolerant to oral methotrexate   Started on Omnipod 10/2020  Switched Ozempic  to Mounjaro  08/2022 due to shortage of supply   SUBJECTIVE:   During the last visit (04/22/2023): A1c 6.8 %      Today (10/28/2023): [name] is here for a month follow up on  diabetes management.  She continues to check glucose multiple times daily through Dexcom.  She was noted with hypoglycemic episodes, but the patient indicates these are false readings in confirms with a fingerstick.  She continues to follow-up with GI for gastric ulcer , she has required hospitalization and transfusions  Denies nausea or vomiting  She denies any diarrhea but has been noted with constipation She continues to use nasal cannula   This patient with type 2 diabetes is treated with Omnipod (insulin  pump). During the visit the pump basal and bolus doses were reviewed including carb/insulin  rations and supplemental doses. The clinical list was updated. The glucose meter download was reviewed in detail to determine if the current pump settings are providing the best glycemic control without excessive hypoglycemia.  Pump and meter download:    Pump   Omnipod Settings   Insulin  type   Humalog     Basal rate       0000  2.3 u/h    0930 2.50          I:C ratio       0000 1:9   11:30 1:8              Sensitivity       0000  10      Goal       0000  110            Type & Model of Pump: Omnipod Insulin  Type: Currently using Humalog  .  PUMP STATISTICS: Average BG: 165 Average Daily Carbs (g): no data Average Total Daily Insulin : 53.3 Average Daily Basal: 44.6( 84%) Average Daily Bolus: 8.7 (16%)      HOME DIABETES REGIMEN:  Mounjaro  10  mg weekly Humalog      CONTINUOUS GLUCOSE MONITORING RECORD INTERPRETATION    Dates of Recording: 10/15/2023-10/28/2023  Sensor description:Dexcom  Results statistics:   CGM use % of time 85.6  Average and SD 165/36  Time in range 73%  % Time Above 180 24  % Time above 250 3  % Time Below target 0    Glycemic patterns summary: BGs are optimal overnight and fluctuate during the day Hyperglycemic episodes  Post prandial  Hypoglycemic episodes occurred  n/a  Overnight periods: Optimal   DIABETIC  COMPLICATIONS: Microvascular complications:  Retinopathy (bleeding in the eye )  Denies: CKD, neuropathy  Last eye exam: Completed 03/2023   Macrovascular complications:  CAD Denies: PVD, CVA       HISTORY:  Past Medical History:  Past Medical History:  Diagnosis Date   Allergy 2018   Anxiety    CHF (congestive heart failure) (HCC)    Clotting disorder (HCC)    blood clot in eye 2016   COPD (chronic obstructive pulmonary disease) (HCC)    Coronary artery disease, non-occlusive    a. 11/2016 NSTEMI/Cath: LM nl, LAD 40p, 50md, D1/2 small, LCX 40ost, OM2/3 nl/small, RCA 35 ost/mid, RPDA/RPL small/nl.   Coronary vasospasm (HCC)    Depression    Diastolic dysfunction    a. 11/2016 Echo: EF 55-60%, gr1 DD, sev calcified MV annulus, mildly dil LA.   Eye hemorrhage 01/2015   right; resolved (07/03/2017)   Gastric ulcer    GERD (gastroesophageal reflux disease)    Headache    monthly (07/03/2017)   Heart murmur    History of blood transfusion    when I had an ectopic pregnancy   History of stomach ulcers    Hyperlipidemia    Hypertension    Microcytic anemia    Moderate mitral stenosis by prior echocardiogram 03/2021   TTE: Mod MS (mean gradient 9 mmHg). -- TEE Moderate-Severe MS (mean gradient 14 mmHg) with fixed Post MV Leaflet & Severe MAC w/ large circumscribed calcified mass on the Post MV Leaflet. (Mass previously described in 2018) - CMRI identifies calcified mass & not Tumor.; R&LHC - LVEDP & PCWP both 23 mmHg indicating minimal resting gradient   Myocardial infarction (HCC) 2023   OSA on CPAP    setting is unknown   Oxygen  deficiency    PAF (paroxysmal atrial fibrillation) (HCC) 03/2021   Event Monitor Aug-Sept 2022: Predominantly sinus rhythm.  1% A. fib burden.  30 brief episodes of PAT.  2 short bursts of NSVT.   PAT (paroxysmal atrial tachycardia) (HCC)    Event monitor from August 2022 showed 30 brief bursts longest 14 seconds.   Pneumonia    several times  (07/03/2017)   PVC's (premature ventricular contractions)    Rheumatoid arthritis (HCC)    Sickle cell anemia (HCC)    Sleep apnea    Stroke (HCC)    Syncope 07/03/2017 X 2   called seizures but no medications   Thalassemia    my cells are sickle cell shaped but I don't have sickle cell anemia (07/03/2017)   Type II diabetes mellitus [facility])    Past Surgical History:  Past Surgical History:  Procedure Laterality Date   48-Hour Monitor  03/18/2017   Sinus rhythm with sinus  tachycardia (rate 58-134 BPM)multiple PVCs noted with couplets and bigeminy. One triplet. 7 runs of PAT ranging from 100-130 bpm. Longest was 33 beats.   BALLOON ENTEROSCOPY N/A 08/13/2023   Procedure: BALLOON ENTEROSCOPY;  Surgeon: San Sandor GAILS, DO;  Location: [facility];  Service: Gastroenterology;  Laterality: N/A;   BIOPSY  04/03/2021   Procedure: BIOPSY;  Surgeon: Leigh Elspeth SQUIBB, MD;  Location: [facility];  Service: Gastroenterology;;   BIOPSY  12/17/2022   Procedure: BIOPSY;  Surgeon: Abran Norleen SAILOR, MD;  Location: [facility];  Service: Gastroenterology;;   BREAST BIOPSY Left    BRONCHIAL WASHINGS  03/28/2021   Procedure: BRONCHIAL WASHINGS;  Surgeon: Gretta Leita SQUIBB, DO;  Location: [facility];  Service: Endoscopy;;   CARDIAC CATHETERIZATION N/A 11/19/2016   Procedure: Left Heart Cath and Coronary Angiography;  Surgeon: Victory LELON Sharps, MD;  Location: [facility] INVASIVE CV LAB;  Service: Cardiovascular.    LM nl, LAD 40p, 50md, D1/2 small, LCX 40ost, OM2/3 nl/small, RCA 35 ost/mid, RPDA/RPL small/nl.   CARDIAC MRI  03/30/2021   Normal LVEF ~53%. 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 No Scar or Late Gadolinium Enhancement on LV Myocardium.   CARPAL TUNNEL RELEASE Right 11/01/2014   COLONOSCOPY     DILATION AND CURETTAGE OF UTERUS     ECTOPIC PREGNANCY SURGERY  X 2   ENTEROSCOPY N/A 09/05/2022   Procedure: ENTEROSCOPY;  Surgeon: Wilhelmenia Aloha Raddle., MD;   Location: [facility];  Service: Gastroenterology;  Laterality: N/A;   ENTEROSCOPY N/A 07/17/2023   Procedure: ENTEROSCOPY;  Surgeon: Abran Norleen SAILOR, MD;  Location: [facility];  Service: Gastroenterology;  Laterality: N/A;   ESOPHAGOGASTRODUODENOSCOPY N/A 12/17/2022   Procedure: ESOPHAGOGASTRODUODENOSCOPY (EGD);  Surgeon: Abran Norleen SAILOR, MD;  Location: [facility];  Service: Gastroenterology;  Laterality: N/A;   ESOPHAGOGASTRODUODENOSCOPY (EGD) WITH PROPOFOL  N/A 04/03/2021   Procedure: ESOPHAGOGASTRODUODENOSCOPY (EGD) WITH PROPOFOL ;  Surgeon: Leigh Elspeth SQUIBB, MD;  Location: [facility];  Service: Gastroenterology;  Laterality: N/A;   ESOPHAGOGASTRODUODENOSCOPY (EGD) WITH PROPOFOL  N/A 08/15/2021   Procedure: ESOPHAGOGASTRODUODENOSCOPY (EGD) WITH PROPOFOL ;  Surgeon: Aneita Gwendlyn DASEN, MD;  Location: [facility];  Service: Endoscopy;  Laterality: N/A;   GIVENS CAPSULE STUDY N/A 07/17/2023   Procedure: GIVENS CAPSULE STUDY;  Surgeon: Abran Norleen SAILOR, MD;  Location: [facility];  Service: Gastroenterology;  Laterality: N/A;   HOT HEMOSTASIS N/A 09/05/2022   Procedure: HOT HEMOSTASIS (ARGON PLASMA COAGULATION/BICAP);  Surgeon: Wilhelmenia Aloha Raddle., MD;  Location: [facility];  Service: Gastroenterology;  Laterality: N/A;   HOT HEMOSTASIS N/A 08/13/2023   Procedure: HOT HEMOSTASIS (ARGON PLASMA COAGULATION/BICAP);  Surgeon: San Sandor GAILS, DO;  Location: [facility];  Service: Gastroenterology;  Laterality: N/A;   JOINT REPLACEMENT     KNEE ARTHROSCOPY Left 11/2014   RIGHT/LEFT HEART CATH AND CORONARY ANGIOGRAPHY N/A 08/08/2021   Procedure: RIGHT/LEFT HEART CATH AND CORONARY ANGIOGRAPHY;  Surgeon: Anner Alm LELON, MD;  Location: [facility] INVASIVE CV LAB;  Service: Cardiovascular;Ost LCx 40%. Ost-Prox RCA 35%. Prox-Mid LAD 25%-<25% D2. Mod-Severely Elevated LVEDP. Mild PA HTN -> PA mean 31 mmHg with a PCWP and LVEDP of 23 mmHg   SUBMUCOSAL TATTOO INJECTION  09/05/2022   Procedure: SUBMUCOSAL  TATTOO INJECTION;  Surgeon: Wilhelmenia Aloha Raddle., MD;  Location: [facility];  Service: Gastroenterology;;   SUBMUCOSAL TATTOO INJECTION  07/17/2023   Procedure: SUBMUCOSAL TATTOO INJECTION;  Surgeon: Abran Norleen SAILOR, MD;  Location: [facility];  Service: Gastroenterology;;   TEE WITHOUT CARDIOVERSION N/A 03/28/2021  Procedure: TRANSESOPHAGEAL ECHOCARDIOGRAM (TEE);  Surgeon: Alveta Aleene PARAS, MD;  Location: [facility];; LVEF 55-60%. Normal LV Fxn & no RWMA. No LAA thrombus. Gr II Ao Plaque. Large circumscribed calcific mass (1.8 x 1.4 cm) anterior to posterior annulus.  Fixed posterior leaflet with Mod-Severe MS (Mean MVG ~14 mmHg, Peak 20 mmhg).  Mild MR   TONSILLECTOMY     TOTAL KNEE ARTHROPLASTY Right 02/18/2015   Procedure: RIGHT TOTAL KNEE ARTHROPLASTY;  Surgeon: Marcey Her, MD;  Location: [facility] OR;  Service: Orthopedics;  Laterality: Right;   TOTAL KNEE ARTHROPLASTY Left 08/19/2015   Procedure: LEFT TOTAL KNEE ARTHROPLASTY;  Surgeon: Marcey Her, MD;  Location: [facility] OR;  Service: Orthopedics;  Laterality: Left;   TRANSTHORACIC ECHOCARDIOGRAM  11/2016; 07/2017:   a) normal LV size, thickness and function. EF 55-60%. GR 1 DD.No RWMA severe mitral annular calcification, but no mitral stenosis. Mild LA dilation;; b) normal LV size and function.  EF 65-75%.  Unable to assess diastolic function.  Aortic sclerosis with no stenosis.  Moderate mitral stenosis? (Gradient 9 mmHg) -?  Mild-mod left atrial enlargement    TRANSTHORACIC ECHOCARDIOGRAM  01/2019   EF 65 to 70%.  Normal function.  Elevated LVEDP-GR 1 DD.  Normal RV size and function.  Large calcific mass on posterior mitral leaflet mild to moderate mitral stenosis.   TRANSTHORACIC ECHOCARDIOGRAM  03/24/2021   EF 60 to 65%.  LV function with normal wall motion.  Moderate basal septal LVH.  GRII DD & Mild LA dilation.-> elevated LVEDP.  Normal RV function.  Mildly elevated PAP.  Ill-defined density measuring 3.23 x 2.1 cm region of the  posterior MV leaflet along with dense MAC.  Moderate MS (mean MVG of 9 mmHg.) Normal AoV & RAP. --> Recommend TEE 2/2 concern for MV SBE.   TRANSTHORACIC ECHOCARDIOGRAM  04/26/2022   EF 60 to 65%.  No RWMA.  Moderate LVH with GR 1 DD.  Normal RV size and function.  Mild LA dilation.  Large calcific mass of posterior MV leaflet.  Mean PG 13 mm 3.  Trivial MR.  Mild to moderate MS.  Severe MAC.  Normal AOV.  Normal RAP.   VAGINAL HYSTERECTOMY     VIDEO BRONCHOSCOPY N/A 03/28/2021   Procedure: VIDEO BRONCHOSCOPY WITHOUT FLUORO;  Surgeon: Gretta Leita SQUIBB, DO;  Location: [facility] ENDOSCOPY;  Service: Endoscopy;  Laterality: N/A;   Social History:  reports that she quit smoking about 8 years ago. Her smoking use included cigarettes. She started smoking about 52 years ago. She has a 11 pack-year smoking history. She has never used smokeless tobacco. She reports that she does not currently use alcohol. She reports that she does not use drugs. Family History:  Family History  Problem Relation Age of Onset   Cancer Mother    Diabetes Mother    Hypertension Mother    Hyperlipidemia Mother    Cancer Father    Hyperlipidemia Father    Mental illness Sister    Diabetes Sister    Diabetes Maternal Grandmother    Diabetes Maternal Grandfather    Colon cancer Neg Hx    Esophageal cancer Neg Hx    Stomach cancer Neg Hx    Rectal cancer Neg Hx    Tremor Neg Hx    Parkinson's disease Neg Hx      HOME MEDICATIONS: Allergies as of 10/28/2023       Reactions   Methotrexate Other (See Comments)   Pneumonitis   Penicillins Hives   Childhood  allergy Has patient had a PCN reaction causing immediate rash, facial/tongue/throat swelling, SOB or lightheadedness with hypotension: Yes Has patient had a PCN reaction causing severe rash involving mucus membranes or skin necrosis: No Has patient had a PCN reaction that required hospitalization No Has patient had a PCN reaction occurring within the last 10 years: No If  all of the above answers are NO, then may proceed with Cephalosporin use.   Farxiga  [dapagliflozin ] Other (See Comments)   Dizzy and lethargic   Metformin  And Related Diarrhea, Other (See Comments)   Also, bleeding        Medication List        Accurate as of October 28, 2023  7:47 AM. If you have any questions, ask your nurse or doctor.          acetaminophen  500 MG tablet Commonly known as: TYLENOL  Take 1,000 mg by mouth every 6 (six) hours as needed (for pain).   albuterol  108 (90 Base) MCG/ACT inhaler Commonly known as: VENTOLIN  HFA TAKE 2 PUFFS BY MOUTH EVERY 6 HOURS AS NEEDED FOR WHEEZE OR SHORTNESS OF BREATH What changed: See the new instructions.   albuterol  (2.5 MG/3ML) 0.083% nebulizer solution Commonly known as: PROVENTIL  INHALE 3 ML BY NEBULIZATION EVERY 6 HOURS AS NEEDED FOR WHEEZING OR SHORTNESS OF BREATH What changed: Another medication with the same name was changed. Make sure you understand how and when to take each.   ALPRAZolam  0.25 MG tablet Commonly known as: XANAX  TAKE 1-2 TABLETS (0.25-0.5 MG TOTAL) BY MOUTH 2 (TWO) TIMES DAILY AS NEEDED FOR ANXIETY. CAN ALSO TAKE A DOSE AS NEEDED FOR INSOMNIA   apixaban  5 MG Tabs tablet Commonly known as: ELIQUIS  Take 1 tablet (5 mg total) by mouth 2 (two) times daily.   atorvastatin  80 MG tablet Commonly known as: LIPITOR  Take 1 tablet (80 mg total) by mouth every evening.   B-D UF III MINI PEN NEEDLES 31G X 5 MM Misc Generic drug: Insulin  Pen Needle USE DAILY WITH VICTOZA  AND LANTUS . What changed: additional instructions   Insulin  Pen Needle 31G X 5 MM Misc 1 Device by Does not apply route 3 (three) times daily. What changed: Another medication with the same name was changed. Make sure you understand how and when to take each.   cetirizine 10 MG tablet Commonly known as: ZYRTEC Take 10 mg by mouth daily as needed for allergies.   Contour Next Test test strip Generic drug: glucose blood 3x daily    cyclobenzaprine  5 MG tablet Commonly known as: FLEXERIL  Take 1 tablet (5 mg total) by mouth 3 (three) times daily as needed for muscle spasms.   Dexcom G6 Transmitter Misc 1 Device by Does not apply route as directed.   Dexcom G7 Sensor Misc 1 Device by Does not apply route as directed.   diltiazem  360 MG 24 hr capsule Commonly known as: CARDIZEM  CD Take 1 capsule (360 mg total) by mouth daily.   folic acid  1 MG tablet Commonly known as: FOLVITE  Take 2 tablets (2 mg total) by mouth daily. What changed: how much to take   furosemide  20 MG tablet Commonly known as: LASIX  Take 1 tablet (20 mg total) by mouth See admin instructions. Take 20 mg by mouth in the morning and an additional 20 mg for an overnight weight gain of 3 pounds or 5 pounds in a week   hydroxychloroquine  200 MG tablet Commonly known as: PLAQUENIL  Take 200 mg by mouth 2 (two) times daily.  insulin  lispro 100 UNIT/ML injection Commonly known as: HUMALOG  Max Daily 100 units per pump What changed:  how much to take when to take this additional instructions   isosorbide  mononitrate 30 MG 24 hr tablet Commonly known as: IMDUR  Take 1 tablet (30 mg total) by mouth daily.   Lancets 30G Misc 1 Device by Does not apply route 3 (three) times daily.   leflunomide  20 MG tablet Commonly known as: ARAVA  Take 20 mg by mouth daily.   losartan  100 MG tablet Commonly known as: COZAAR  Take 0.5 tablets (50 mg total) by mouth daily.   Melatonin 5 MG Chew Chew 10 mg by mouth at bedtime.   multivitamin with minerals Tabs tablet Take 1 tablet by mouth daily with breakfast.   Omnipod 5 DexG7G6 Pods Gen 5 Misc Inject 1 Device into the skin every 3 (three) days.   pantoprazole  40 MG tablet Commonly known as: PROTONIX  Take 1 tablet (40 mg total) by mouth daily.   SAFETY-LOK TB SYRINGE 27GX.5 27G X 1/2 1 ML Misc Generic drug: TUBERCULIN SYR 1CC/27GX1/2   sodium chloride  0.65 % Soln nasal spray Commonly known  as: OCEAN Place 1 spray into both nostrils as needed. What changed: reasons to take this   tirzepatide  10 MG/0.5ML Pen Commonly known as: MOUNJARO  Inject 10 mg into the skin once a week. What changed: when to take this   traZODone  50 MG tablet Commonly known as: DESYREL  Take 0.5-1 tablets (25-50 mg total) by mouth at bedtime as needed. for sleep What changed:  how much to take when to take this additional instructions   VICKS SINEX NA Place 1 spray into both nostrils every 12 (twelve) hours as needed (for congestion- MAX OF THREE CONSECUTIVE DAYS).         OBJECTIVE:   Vital Signs: LMP  (LMP Unknown)     Wt Readings from Last 3 Encounters:  08/27/23 234 lb 6.4 oz (106.3 kg)  08/13/23 233 lb (105.7 kg)  07/24/23 234 lb 12.6 oz (106.5 kg)     Exam: General: Pt appears well , nasal cannula in place  Neuro: MS is good with appropriate affect, pt is alert and Ox3       DM foot exam: 04/22/2023 The skin of the feet is intact without sores or ulcerations. The pedal pulses are 2+ on right and 2+ on left. The sensation is intact to a screening 5.07, 10 gram monofilament bilaterally    DATA REVIEWED:  Lab Results  Component Value Date   HGBA1C 6.8 (A) 04/22/2023   HGBA1C 6.5 (H) 12/15/2022   HGBA1C 7.6 (A) 04/12/2022         Latest Reference Range & Units 07/24/23 05:50  Sodium 135 - 145 mmol/L 140  Potassium 3.5 - 5.1 mmol/L 3.4 (L)  Chloride 98 - 111 mmol/L 94 (L)  CO2 22 - 32 mmol/L 33 (H)  Glucose 70 - 99 mg/dL 848 (H)  BUN 8 - 23 mg/dL 13  Creatinine 9.55 - 8.99 mg/dL 8.83 (H)  Calcium  8.9 - 10.3 mg/dL 9.3  Anion gap 5 - 15  13  Alkaline Phosphatase 38 - 126 U/L 65  Albumin 3.5 - 5.0 g/dL 3.5  AST 15 - 41 U/L 16  ALT 0 - 44 U/L 20  Total Protein 6.5 - 8.1 g/dL 7.1  Total Bilirubin 0.3 - 1.2 mg/dL 0.5  GFR, Estimated >39 mL/min 51 (L)    ASSESSMENT / PLAN / RECOMMENDATIONS:   1) Type 2 Diabetes Mellitus, Optimally ,  With retinopathy and  macrovascular complications -    -This was a virtual visit, no A1c available, but her GMI through CGM download is 7.3% -Patient continues not to bolus for carbohydrates consumed, hence postprandial hyperglycemia.  BGs are optimal between the meals -I have recommended increasing Mounjaro , encourage water  and fiber intake to prevent worsening constipation -She endorses nausea with Ozempic  -I will decrease basal rate during the day to prevent hypoglycemia   MEDICATIONS: Increase Mounjaro  12.5 mg weekly  Humalog  per pump   Pump   Omnipod Settings   Insulin  type   Humalog     Basal rate       0000  2.20 u/h    0930 2.40          I:C ratio       0000 1:9   11:30 1:8              Sensitivity       0000  10      Goal       0000  110           EDUCATION / INSTRUCTIONS: BG monitoring instructions: Patient is instructed to check her blood sugars 3 times a day, fasting , supper and bedtime. Call [facility] if: BG persistently < 70  I reviewed the Rule of 15 for the treatment of hypoglycemia in detail with the patient. Literature supplied.    F/U in 6 months    Signed electronically by: Stefano Redgie Butts, MD  [facility] End[facility]ology  [facility] [address] [place] [place], [place] [address] Phone: [phone] FAX: [phone]   CC: Watt [name], MD [address] [place] [address] Phone: [phone]  Fax: [phone]  Return to Endocrinology clinic as below: Future Appointments  Date Time Provider Department Center  10/28/2023  3:00 PM Linnet Bottari, Donell Redgie, MD [facility] None  10/31/2023  1:00 PM [facility] PFT ROOM [facility] None  10/31/2023  1:30 PM Geronimo Amel, MD [facility] None

## 2023-10-29 ENCOUNTER — Encounter: Payer: Self-pay | Admitting: Cardiology

## 2023-10-30 ENCOUNTER — Other Ambulatory Visit: Payer: Self-pay | Admitting: Nutrition

## 2023-10-31 ENCOUNTER — Ambulatory Visit: Payer: Medicare Other | Admitting: Internal Medicine

## 2023-10-31 ENCOUNTER — Encounter: Payer: Self-pay | Admitting: Internal Medicine

## 2023-10-31 VITALS — BP 124/64 | HR 70 | Ht 62.5 in | Wt 239.1 lb

## 2023-10-31 DIAGNOSIS — G4733 Obstructive sleep apnea (adult) (pediatric): Secondary | ICD-10-CM

## 2023-10-31 DIAGNOSIS — J679 Hypersensitivity pneumonitis due to unspecified organic dust: Secondary | ICD-10-CM | POA: Diagnosis not present

## 2023-10-31 DIAGNOSIS — I050 Rheumatic mitral stenosis: Secondary | ICD-10-CM | POA: Diagnosis not present

## 2023-10-31 DIAGNOSIS — J849 Interstitial pulmonary disease, unspecified: Secondary | ICD-10-CM | POA: Diagnosis not present

## 2023-10-31 LAB — PULMONARY FUNCTION TEST
DL/VA % pred: 100 %
DL/VA: 4.2 ml/min/mmHg/L
DLCO cor % pred: 69 %
DLCO cor: 12.91 ml/min/mmHg
DLCO unc % pred: 69 %
DLCO unc: 12.91 ml/min/mmHg
FEF 25-75 Pre: 1.06 L/s
FEF2575-%Pred-Pre: 57 %
FEV1-%Pred-Pre: 58 %
FEV1-Pre: 1.26 L
FEV1FVC-%Pred-Pre: 104 %
FEV6-%Pred-Pre: 58 %
FEV6-Pre: 1.58 L
FEV6FVC-%Pred-Pre: 104 %
FVC-%Pred-Pre: 55 %
FVC-Pre: 1.58 L
Pre FEV1/FVC ratio: 80 %
Pre FEV6/FVC Ratio: 100 %

## 2023-10-31 NOTE — Patient Instructions (Addendum)
Spirometry/DLCO performed today. 

## 2023-10-31 NOTE — Progress Notes (Signed)
@Patient  ID: Lynn [name], female    DOB: April 27, 1956, 68 y.o.   MRN: [id number]  Chief Complaint  Patient presents with   Follow-up    06-09/22, She reports having trouble catching her breath at times. She has clear sputum at times.      04/02/21 PCCM Hospital consult -Dr. Marchelle Gearing 68 year old female, former smoker quit in May 2016 (11-pack-year history).  Past medical history significant for rheumatoid arthritis on methotrexate, hypertension, heart failure with preserved ejection fraction, type 2 diabetes, chronic anemia thalassemia. Former patient of Dr. Sherene Sires, last seen in office on 01/14/2018.  68 year old woman with PMHx significant for HTN, HFpEF (Echo 03/2021 EF 55-60%), T2DM, chronic anemia 2/2 thalassemia, osteoarthritis (s/p bilateral TKA) and rheumatoid arthritis (on methotrexate) admitted to [facility] 03/23/21 with acute hypoxemic respiratory failure.   Patient was recently treated with doxycycline and azithromycin for presumed bronchitis. Febrile this admission to Tmax of 101.15F with dry cough. CTA Chest 6/7 demonstrating scattered diffuse areas of ground glass attenuation, negative for pulmonary emboli. COVID and influenza PCRs negative.   PCCM consulted for further evaluation/recommendations for respiratory failure and fevers.  Admitted 03/23/2021 6/11 with increasing oxygen needs to 5L 6/14 - Bronch BAL - 64% lymphs, 32% mono, Cytology without malig cells. Culture negative as of 6/16.  Started Solu-Medrol.  Gives history of methotrexate injection and also feather pillow exposure 6/16 - CT Chest completed with GGOs, negative for PE. Cardiac MRI demonstrating mitral annular mass c/w degenerative mitral annular calcification. Melena noted in PM, FOBT+. 6/17 - GI consulted for melena/Hgb drift. Plan for EGD pending pulmonary clearance for procedure. Ceftriaxone started.  3 L oxygen need    04/26/2021 - PV Patient presents today for hospital follow-up. She was admitted on  03/23/21 for acute hypoxic respiratory failure secondary to interstitial lung disease.  Patient underwent bronchoscopy on 6/14 showed predominant lymphocytosis greater than 60% on BAL, felt to be consistent with methotrexate hypersensitivity pneumonitis or feather pillow hypersensitivity pneumonitis or combination; rarely it can be ill IP from rheumatoid arthritis. CT chest on 03/30/2021 showed marked increase peribronchovascular groundglass and upper/mid lung zone with interstitial thickening and scattered collapse/consolidation. Suggestive of alternative diagnosis (not UIP).  Infectious disease was consulted on 03/31/2021 recommending Bactrim prophylactically if on prednisone greater than 20 mg/day for 2 months.  Today she is doing some better. She has an occasional dry cough, recently started coughing up some clear mucus.  Patient states that she never in fact had a feather pillow.  She has been encouraged to discuss alternative options to methotrexate with her rheumatologist.  She is currently on slow prednisone taper for the next 2 to 3 months started on 04/03/21 week 25 of 2022 (40 mg/day x 2 weeks; 30 mg/day x 2 weeks; 20 mg/day x 2 weeks; 10 mg/day x 2 weeks; 5 mg/day x 2 weeks and then 5 mg Monday Wednesday Friday x2 weeks). She is taking Bactrim DS daily per ID. She is also taking Protonix twice a day. She is using 2L oxygen with exertion and at night.   Results for VOLLIE, TITMUS "PAT" (MRN [id number]) as of 06/28/2021 15:00  Ref. Range 03/28/2021 14:10  Monocyte-Macrophage-Serous Fluid Latest Ref Range: 50 - 90 % 32 (L)  Fluid Type-FCT Unknown Bronch [place]  Color, Fluid Latest Ref Range: YELLOW  COLORLESS (A)  Total Nucleated Cell Count, Fluid Latest Ref Range: 0 - 1,000 cu mm 91  Lymphs, Fluid Latest Units: % 64  Eos, Fluid Latest Units: % 1  Appearance, Fluid Latest Ref Range: CLEAR  CLEAR (A)  Neutrophil Count, Fluid Latest Ref Range: 0 - 25 % 3    OV 06/28/2021 - week  37  Subjective:  Patient ID: Lynn [name], female , DOB: [birth year] , age 34 y.o. , MRN: [id number] , ADDRESS: [address] [place] [id number] PCP Copland, Gwenlyn Found, MD Patient Care Team: Copland, Gwenlyn Found, MD as PCP - General (Family Medicine) Marykay Lex, MD as PCP - Cardiology (Cardiology)  This Provider for this visit: Treatment Team:  Attending Provider: Kalman Shan, MD    06/28/2021 -   Chief Complaint  Patient presents with   Follow-up    Pt states she has been doing okay since last visit. States her breathing is about the same. States she has occ chest pains.   68 year old with rheumatoid arthritis on methotrexate.  Admitted June 2022 for acute hypoxemic infiltrates respiratory failure with groundglass opacities.  BAL positive for greater than 60% lymphocytes.  Differential diagnose of methotrexate related lung injury versus feather pillow hypersensitive pneumonitis.  Started on 10-week prednisone taper at discharge along with Bactrim.  She is known to have moderate to severe mitral stenosis on TEE June 2022.  Also morbid obesity  HPI [name] 68 y.o. -returns for follow-up.  At this point in time she is supposed to be done with prednisone but for some reason she still on prednisone although next week should be done with it.  She was discharged on 4 L nasal cannula.  She continues to use oxygen at 3 L nasal cannula at rest and with exertion.  Her current symptom score is below.  Overall she does feel improved compared to when she was in the hospital although it is unclear whether she is improved compared to 1 month ago.  She does still feel some chest tightness and still has some cough.  Her current symptom score and walk test is below.Here walking desaturation test and got severely symptomatic but did not desaturate.  This is an improvement.  She has upcoming appoint with Dr. Bryan Lemma and cardiology for mitral  stenosis.    CT Chest data HRCT 06/23/21   Narrative & Impression  CLINICAL DATA:  68 year old female with history of shortness of breath with exertion. Evaluate for interstitial lung disease.   EXAM: CT CHEST WITHOUT CONTRAST   TECHNIQUE: Multidetector CT imaging of the chest was performed following the standard protocol without intravenous contrast. High resolution imaging of the lungs, as well as inspiratory and expiratory imaging, was performed.   COMPARISON:  Chest CT 03/30/2021.   FINDINGS: Cardiovascular: Heart size is normal. There is no significant pericardial fluid, thickening or pericardial calcification. There is aortic atherosclerosis, as well as atherosclerosis of the great vessels of the mediastinum and the coronary arteries, including calcified atherosclerotic plaque in the left main, left anterior descending, left circumflex and right coronary arteries. Severe calcifications of the mitral valve and annulus.   Mediastinum/Nodes: No pathologically enlarged mediastinal or hilar lymph nodes. Please note that accurate exclusion of hilar adenopathy is limited on noncontrast CT scans. Esophagus is unremarkable in appearance. No axillary lymphadenopathy.   Lungs/Pleura: When compared to prior examination from 03/30/2021, much of the previously noted parenchymal lung changes have resolved. There continues to be some patchy areas of ground-glass attenuation, septal thickening and mild subpleural reticulation scattered throughout the lungs bilaterally, with no discernible craniocaudal gradient. No traction bronchiectasis or frank honeycombing. Inspiratory and expiratory imaging demonstrates some mild air  trapping indicative of mild small airways disease. No acute consolidative airspace disease. No pleural effusions. No definite suspicious appearing pulmonary nodules or masses are noted.   Upper Abdomen: Aortic atherosclerosis.   Musculoskeletal: There are no  aggressive appearing lytic or blastic lesions noted in the visualized portions of the skeleton.   IMPRESSION: 1. The appearance of the lungs is compatible with interstitial lung disease, but the spectrum of findings is most indicative of an alternative diagnosis (not usual interstitial pneumonia) per current ATS guidelines. Specifically, findings are favored to reflect mild post infectious or inflammatory fibrosis, and are significantly improved compared to the prior examination. 2. Aortic atherosclerosis, in addition to left main and 3 vessel coronary artery disease. Please note that although the presence of coronary artery calcium documents the presence of coronary artery disease, the severity of this disease and any potential stenosis cannot be assessed on this non-gated CT examination. Assessment for potential risk factor modification, dietary therapy or pharmacologic therapy may be warranted, if clinically indicated. 3. There are severe bulky calcifications of the mitral valve and annulus. Echocardiographic correlation for evaluation of potential valvular dysfunction may be warranted if clinically indicated.   Aortic Atherosclerosis (ICD10-I70.0).     Electronically Signed   By: Trudie Reed M.D.   On: 06/24/2021 08:23          06/28/2021: OV with Dr. Marchelle Gearing. She was supposed to be done with prednisone taper but for some reason, still on it. Should complete next week. She was discharged on 4 lpm supplemental oxygen. Continues to use 3 lpm at rest and exertion. Feels improved compared to when she was in the hospital. Still has chest tightness and still has some cough. She did not have any desaturations on her walk test but did become severely symptomatic. Has known moderate to severe MS. Clinically better and CT with significant improvement; perhaps some residual scar tissue. Suggest to no longer use oxygen with rest or only walking short distances. Check ONO on room air.  Advised to check with Dr. Herbie Baltimore to see if ok to do SOB.  10/25/2022: OV with Cobb NP for overdue follow up. She was encouraged by her PCP to come back to see [place]. Since she was here last, she was hospitalized in November 2023 for anemia related to GI bleed. Feeling better since discharge but hgb has not entirely normalized so she still has some residual fatigue. As far as her breathing goes, it's better since her hospitalization but she feels it is unchanged compared to when she was here last. She gets winded climbing stairs or with uphill walking. She is able to complete ADLs without difficulties. She has an occasional cough, which is sometimes productive with clear to white sputum. She uses her nebulizer treatments daily, which seem to help her clear sputum and decrease chest congestion. She also notices that her cough gets worse if she has swelling so she makes sure to take her lasix when this happens, which helps. Still uses her oxygen with activity and at night. She monitors her oxygen levels at home and is never below 92% on room air. Denies any night sweats, fevers, chills, wheezing, leg swelling, orthopnea, PND. She has RA on plaquenil. She is also on Eliquis for PAF; no further episodes of melena since her hospital stay.   She does have a CPAP, which she wore nightly but it recently broke. She was diagnosed with sleep apnea around 10-12 years ago. Has been on CPAP since. Receives good benefit from  use. She's not sure what her settings were set on. She has noticed that since she hasn't been able to use it, she's been waking up more tired and doesn't feel as rested. Denies drowsy driving or morning headaches.    12/14/2022: Today - follow up  80-year-old female, former smoker (11 pack history) followed for pneumonitis and DOE. She is a patient of Dr. Jane Canary and last seen in office 10/25/2022 by Allison Quarry NP. Past medical history significant for rheumatoid arthritis previously on methotrexate, HTN, HFpEF,  DM II, chronic anemia thalassemia.   She was last seen in 2022 after being admitted in June to the hospital for acute respiratory failure secondary to pneumonitis, felt to be methotrexate or feather pill hypersenstivity after undergoing bronchoscopy with predominant lymphocytosis greater than 60% on BAL. She was treated with high dose steroids and bactrim for PJP prophylaxis. Imaging improved so she was tapered off by September 2022.  Patient presents today for follow up after HRCT chest and PFTs. Her last set of PFTs were prior to her hospitalization for pneumonitis. She has had a significant decline in her lung function with restrictive defect when compared to 2019. Her DLCO is stable from 70% to 67%; although, this was not corrected for current hgb (last draw 08/2022 with hgb 9.7). Her HRCT was stable with mild changes and no evidence of progression. Findings were suggestive of HP and not UIP pattern. She tells me today that she's noticed for the past month, she's felt a little more short winded with activity and feels like she gets fatigued more easily. She has a cough, which is relatively unchanged from baseline, and productive with clear to white phlegm. Occasionally feels like her chest is congestion, which improves with saline neb. Doesn't notice any wheezing. She denies any fevers, night sweats, hemoptysis, low oxygen levels, leg swelling, orthopnea, palpitations. Albuterol inhaler does help some. She has not missed any doses of her Eliquis.   She is still using her CPAP machine at night. Humidification doesn't seem to be working properly. She wants a new machine. Does not have her previous sleep study. She doesn't have a SD card in her machine. DME stated that she needs a new sleep study but she has yet to have this scheduled.   TEST/EVENTS:  01/14/2018 PFT: FVC 77, FEV1 84, ratio 87, TLC 75, DLCO 70.  No BD 06/23/2021 HRCT chest: Atherosclerosis.  Severe calcifications of mitral valve.  Much of the  previously noted parenchymal lung changes have resolved.  Continues to be some patchy areas of groundglass attenuation, septal thickening and mild subpleural reticulation scattered throughout the lungs bilaterally.  No discernible craniocaudal gradient.  No traction bronchiectasis or frank honeycombing.  Favored to reflect a mild postinfectious or inflammatory fibrosis, significantly improved compared to prior exam.  Some mild air trapping indicative of mild small airways disease.  11/23/2022 HRCT chest: Mild areas of subpleural reticulation with minimal associated traction bronchiectasis and no clear craniocaudal gradient.  Associated air trapping suggestive of hypersensitivity pneumonitis.  No evidence of progression when compared with previous CT.  Not UIP.  CAD and atherosclerosis. 12/14/2022 PFT: FVC 42, FEV1 46, TLC 58, DLCOunc 67  OV 10/31/2023  Subjective:  Patient ID: Lynn [name], female , DOB: October 12, 1956 , age 36 y.o. , MRN: [id number] , ADDRESS: [address] [place] [address] PCP Copland, Gwenlyn Found, MD Patient Care Team: Copland, Gwenlyn Found, MD as PCP - General (Family Medicine) Marykay Lex, MD as PCP - Cardiology (Cardiology)  Duke, Roe Rutherford, PA as Physician Assistant (Cardiology)  This Provider for this visit: Treatment Team:  Attending Provider: Kalman Shan, MD    10/31/2023 -   Chief Complaint  Patient presents with   Follow-up    Pft f/u, pt states her breathing has been up and down. Coughing and oxygen dependent. Using inhalers but more concerns about her cough    #Interstitial lung disease with features of hypersensitive pneumonitis but in the setting of rheumatoid arthritis =--on supportive care #Associated sleep apnea #Moderate to severe mitral stenosis followed by Dr. Bryan Lemma - Obesity  HPI Lynn [name] 68 y.o. -returns for follow-up.  I personally not seen in 3 years.  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  She says the breathing itself is stable [symptom scores are stable].  She started [facility] for weight loss but she says she has not lost any weight.  She continues to use oxygen as needed although with exercise hypoxemia test she did not desaturate although she showed a tendency to drop.  Her main issues in the interim have been 3 episodes of upper GI bleed.  She says that with a cardiac valve she is not a surgical candidate but desires a second opinion.  I did indicate to her that she should talk Dr. Herbie Baltimore about second opinion potentially at [facility].  For rheumatoid arthritis she is not taking any medications including Plaquenil.  She sees Azucena Fallen.  Currently pain-free from rheumatoid arthritis.  Current symptom scores are below.   SYMPTOM SCALE - ILD 06/28/2021  12/14/2022 10/31/2023   Current weight 251#  242 239#  O2 use 3LNC     Shortness of Breath 0 -> 5 scale with 5 being worst (score 6 If unable to do)     At rest 3  3 4   Simple tasks - showers, clothes change, eating, shaving 4  5 3   Household (dishes, doing bed, laundry) 4  5 4   Shopping 4  5 1   Walking level at own pace 4  4 3   Walking up Stairs 4  5 4   Total (30-36) Dyspnea Score 23   19  How bad is your cough? 3  3 4   How bad is your fatigue Did not answer  4 1  How bad is nausea 0  0 0  How bad is vomiting?   0  0 0  How bad is diarrhea? 2  0 0  How bad is anxiety? 4  4 5   How bad is depression 3  3 0  Any chronic pain - if so where and how bad Did nto sak   0     Simple office walk 185 feet x  3 laps goal with forehead probe 06/28/2021    10/25/2022 10/31/2023   O2 used ra  ra ra  Number laps completed Didonly 2 of 3  3/3   Comments about pace slow  slow Sit stad  1596% and HR 83  Resting Pulse Ox/HR 100% and 77/min  96%, 78/min 92% and HR 94  Final Pulse Ox/HR 98% and 99/min  96%, 115/min   Desaturated </= 88% no  no   Desaturated <= 3% points no  no   Got Tachycardic >/= 90/min  yes  yes   Symptoms at end of test Modreate tos evere shortness of breath  mild SOB   Miscellaneous comments           PFT  Latest Ref Rng & Units 10/31/2023   12:50 PM 12/14/2022   10:02 AM 01/14/2018   10:59 AM  PFT Results  FVC-Pre L 1.58  P 1.21  1.85   FVC-Predicted Pre % 55  P 42  77   FVC-Post L   1.87   FVC-Predicted Post %   78   Pre FEV1/FVC % % 80  P 84  85   Post FEV1/FCV % %   87   FEV1-Pre L 1.26  P 1.02  1.57   FEV1-Predicted Pre % 58  P 46  84   FEV1-Post L   1.62   DLCO uncorrected ml/min/mmHg 12.91  P 12.49  15.77   DLCO UNC% % 69  P 67  70   DLCO corrected ml/min/mmHg 12.91  P 12.49    DLCO COR %Predicted % 69  P 67    DLVA Predicted % 100  P 102  101   TLC L  2.81  3.66   TLC % Predicted %  58  75   RV % Predicted %  -307  81     P Preliminary result      LAB RESULTS last 96 hours No results found.  LAB RESULTS last 90 days Recent Results (from the past 2160 hours)  Glucose, capillary     Status: Abnormal   Collection Time: 08/13/23  7:21 AM  Result Value Ref Range   Glucose-Capillary 103 (H) 70 - 99 mg/dL    Comment: Glucose reference range applies only to samples taken after fasting for at least 8 hours.  Glucose, capillary     Status: Abnormal   Collection Time: 08/13/23  8:41 AM  Result Value Ref Range   Glucose-Capillary 127 (H) 70 - 99 mg/dL    Comment: Glucose reference range applies only to samples taken after fasting for at least 8 hours.  Pulmonary function test     Status: None (Preliminary result)   Collection Time: 10/31/23 12:50 PM  Result Value Ref Range   FVC-Pre 1.58 L   FVC-%Pred-Pre 55 %   FEV1-Pre 1.26 L   FEV1-%Pred-Pre 58 %   FEV6-Pre 1.58 L   FEV6-%Pred-Pre 58 %   Pre FEV1/FVC ratio 80 %   FEV1FVC-%Pred-Pre 104 %   Pre FEV6/FVC Ratio 100 %   FEV6FVC-%Pred-Pre 104 %   FEF 25-75 Pre 1.06 L/sec   FEF2575-%Pred-Pre 57 %   DLCO unc 12.91 ml/min/mmHg   DLCO unc % pred 69 %   DLCO cor 12.91 ml/min/mmHg   DLCO cor %  pred 69 %   DL/VA 9.51 ml/min/mmHg/L   DL/VA % pred 884 %         has a past medical history of Allergy (2018), Anxiety, CHF (congestive heart failure) (HCC), Clotting disorder (HCC), COPD (chronic obstructive pulmonary disease) (HCC), Coronary artery disease, non-occlusive, Coronary vasospasm (HCC), Depression, Diastolic dysfunction, Eye hemorrhage (01/2015), Gastric ulcer, GERD (gastroesophageal reflux disease), Headache, Heart murmur, History of blood transfusion, History of stomach ulcers, Hyperlipidemia, Hypertension, Microcytic anemia, Moderate mitral stenosis by prior echocardiogram (03/2021), Myocardial infarction (HCC) (2023), OSA on CPAP, Oxygen deficiency, PAF (paroxysmal atrial fibrillation) (HCC) (03/2021), PAT (paroxysmal atrial tachycardia) (HCC), Pneumonia, PVC's (premature ventricular contractions), Rheumatoid arthritis (HCC), Sickle cell anemia (HCC), Sleep apnea, Stroke (HCC), Syncope (07/03/2017 X 2), Thalassemia, and Type II diabetes mellitus (HCC).   reports that she quit smoking about 8 years ago. Her smoking use included cigarettes. She started smoking about 52 years ago. She has a 11 pack-year  smoking history. She has never used smokeless tobacco.  Past Surgical History:  Procedure Laterality Date   48-Hour Monitor  03/18/2017   Sinus rhythm with sinus tachycardia (rate 58-134 BPM)multiple PVCs noted with couplets and bigeminy. One triplet. 7 runs of PAT ranging from 100-130 bpm. Longest was 33 beats.   BALLOON ENTEROSCOPY N/A 08/13/2023   Procedure: BALLOON ENTEROSCOPY;  Surgeon: Shellia Cleverly, DO;  Location: [facility] ENDOSCOPY;  Service: Gastroenterology;  Laterality: N/A;   BIOPSY  04/03/2021   Procedure: BIOPSY;  Surgeon: Benancio Deeds, MD;  Location: [facility];  Service: Gastroenterology;;   BIOPSY  12/17/2022   Procedure: BIOPSY;  Surgeon: Hilarie Fredrickson, MD;  Location: [facility];  Service: Gastroenterology;;   BREAST BIOPSY Left    BRONCHIAL WASHINGS   03/28/2021   Procedure: BRONCHIAL WASHINGS;  Surgeon: Steffanie Dunn, DO;  Location: [facility];  Service: Endoscopy;;   CARDIAC CATHETERIZATION N/A 11/19/2016   Procedure: Left Heart Cath and Coronary Angiography;  Surgeon: Lyn Records, MD;  Location: [facility] INVASIVE CV LAB;  Service: Cardiovascular.    LM nl, LAD 40p, 50md, D1/2 small, LCX 40ost, OM2/3 nl/small, RCA 35 ost/mid, RPDA/RPL small/nl.   CARDIAC MRI  03/30/2021   Normal LVEF ~53%. 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 No Scar or Late Gadolinium Enhancement on LV Myocardium.   CARPAL TUNNEL RELEASE Right 11/01/2014   COLONOSCOPY     DILATION AND CURETTAGE OF UTERUS     ECTOPIC PREGNANCY SURGERY  X 2   ENTEROSCOPY N/A 09/05/2022   Procedure: ENTEROSCOPY;  Surgeon: Meridee Score Netty Starring., MD;  Location: [facility];  Service: Gastroenterology;  Laterality: N/A;   ENTEROSCOPY N/A 07/17/2023   Procedure: ENTEROSCOPY;  Surgeon: Hilarie Fredrickson, MD;  Location: [facility];  Service: Gastroenterology;  Laterality: N/A;   ESOPHAGOGASTRODUODENOSCOPY N/A 12/17/2022   Procedure: ESOPHAGOGASTRODUODENOSCOPY (EGD);  Surgeon: Hilarie Fredrickson, MD;  Location: [facility];  Service: Gastroenterology;  Laterality: N/A;   ESOPHAGOGASTRODUODENOSCOPY (EGD) WITH PROPOFOL N/A 04/03/2021   Procedure: ESOPHAGOGASTRODUODENOSCOPY (EGD) WITH PROPOFOL;  Surgeon: Benancio Deeds, MD;  Location: [facility];  Service: Gastroenterology;  Laterality: N/A;   ESOPHAGOGASTRODUODENOSCOPY (EGD) WITH PROPOFOL N/A 08/15/2021   Procedure: ESOPHAGOGASTRODUODENOSCOPY (EGD) WITH PROPOFOL;  Surgeon: Meryl Dare, MD;  Location: [facility];  Service: Endoscopy;  Laterality: N/A;   GIVENS CAPSULE STUDY N/A 07/17/2023   Procedure: GIVENS CAPSULE STUDY;  Surgeon: Hilarie Fredrickson, MD;  Location: [facility];  Service: Gastroenterology;  Laterality: N/A;   HOT HEMOSTASIS N/A 09/05/2022   Procedure: HOT HEMOSTASIS (ARGON PLASMA  COAGULATION/BICAP);  Surgeon: Lemar Lofty., MD;  Location: [facility];  Service: Gastroenterology;  Laterality: N/A;   HOT HEMOSTASIS N/A 08/13/2023   Procedure: HOT HEMOSTASIS (ARGON PLASMA COAGULATION/BICAP);  Surgeon: Shellia Cleverly, DO;  Location: [facility];  Service: Gastroenterology;  Laterality: N/A;   JOINT REPLACEMENT     KNEE ARTHROSCOPY Left 11/2014   RIGHT/LEFT HEART CATH AND CORONARY ANGIOGRAPHY N/A 08/08/2021   Procedure: RIGHT/LEFT HEART CATH AND CORONARY ANGIOGRAPHY;  Surgeon: Marykay Lex, MD;  Location: [facility] INVASIVE CV LAB;  Service: Cardiovascular;Ost LCx 40%. Ost-Prox RCA 35%. Prox-Mid LAD 25%-<25% D2. Mod-Severely Elevated LVEDP. Mild PA HTN -> PA mean 31 mmHg with a PCWP and LVEDP of 23 mmHg   SUBMUCOSAL TATTOO INJECTION  09/05/2022   Procedure: SUBMUCOSAL TATTOO INJECTION;  Surgeon: Lemar Lofty., MD;  Location: [facility]COPY;  Service: Gastroenterology;;   SUBMUCOSAL TATTOO INJECTION  07/17/2023  Procedure: SUBMUCOSAL TATTOO INJECTION;  Surgeon: Hilarie Fredrickson, MD;  Location: [facility];  Service: Gastroenterology;;   TEE WITHOUT CARDIOVERSION N/A 03/28/2021   Procedure: TRANSESOPHAGEAL ECHOCARDIOGRAM (TEE);  Surgeon: Vesta Mixer, MD;  Location: [facility] ENDOSCOPY;; LVEF 55-60%. Normal LV Fxn & no RWMA. No LAA thrombus. Gr II Ao Plaque. Large circumscribed calcific mass (1.8 x 1.4 cm) anterior to posterior annulus.  Fixed posterior leaflet with Mod-Severe MS (Mean MVG ~14 mmHg, Peak 20 mmhg).  Mild MR   TONSILLECTOMY     TOTAL KNEE ARTHROPLASTY Right 02/18/2015   Procedure: RIGHT TOTAL KNEE ARTHROPLASTY;  Surgeon: Beverely Low, MD;  Location: [facility] OR;  Service: Orthopedics;  Laterality: Right;   TOTAL KNEE ARTHROPLASTY Left 08/19/2015   Procedure: LEFT TOTAL KNEE ARTHROPLASTY;  Surgeon: Beverely Low, MD;  Location: [facility] OR;  Service: Orthopedics;  Laterality: Left;   TRANSTHORACIC ECHOCARDIOGRAM  11/2016; 07/2017:   a) normal LV size, thickness  and function. EF 55-60%. GR 1 DD.No RWMA severe mitral annular calcification, but no mitral stenosis. Mild LA dilation;; b) normal LV size and function.  EF 65-75%.  Unable to assess diastolic function.  Aortic sclerosis with no stenosis.  Moderate mitral stenosis? (Gradient 9 mmHg) -?  Mild-mod left atrial enlargement    TRANSTHORACIC ECHOCARDIOGRAM  01/2019   EF 65 to 70%.  Normal function.  Elevated LVEDP-GR 1 DD.  Normal RV size and function.  Large calcific mass on posterior mitral leaflet mild to moderate mitral stenosis.   TRANSTHORACIC ECHOCARDIOGRAM  03/24/2021   EF 60 to 65%.  LV function with normal wall motion.  Moderate basal septal LVH.  GRII DD & Mild LA dilation.-> elevated LVEDP.  Normal RV function.  Mildly elevated PAP.  Ill-defined density measuring 3.23 x 2.1 cm region of the posterior MV leaflet along with dense MAC.  Moderate MS (mean MVG of 9 mmHg.) Normal AoV & RAP. --> Recommend TEE 2/2 concern for MV SBE.   TRANSTHORACIC ECHOCARDIOGRAM  04/26/2022   EF 60 to 65%.  No RWMA.  Moderate LVH with GR 1 DD.  Normal RV size and function.  Mild LA dilation.  Large calcific mass of posterior MV leaflet.  Mean PG 13 mm 3.  Trivial MR.  Mild to moderate MS.  Severe MAC.  Normal AOV.  Normal RAP.   VAGINAL HYSTERECTOMY     VIDEO BRONCHOSCOPY N/A 03/28/2021   Procedure: VIDEO BRONCHOSCOPY WITHOUT FLUORO;  Surgeon: Steffanie Dunn, DO;  Location: [facility] ENDOSCOPY;  Service: Endoscopy;  Laterality: N/A;    Allergies  Allergen Reactions   Methotrexate Other (See Comments)    Pneumonitis   Penicillins Hives    Childhood allergy Has patient had a PCN reaction causing immediate rash, facial/tongue/throat swelling, SOB or lightheadedness with hypotension: Yes Has patient had a PCN reaction causing severe rash involving mucus membranes or skin necrosis: No Has patient had a PCN reaction that required hospitalization No Has patient had a PCN reaction occurring within the last 10 years: No If all  of the above answers are "NO", then may proceed with Cephalosporin use.   Marcelline Deist [Dapagliflozin] Other (See Comments)    Dizzy and lethargic    Metformin And Related Diarrhea and Other (See Comments)    Also, bleeding    Immunization History  Administered Date(s) Administered   Fluad Quad(high Dose 65+) 07/19/2021   Fluad Trivalent(High Dose 65+) 07/17/2023   Influenza Split 07/22/2012   Influenza, High Dose Seasonal PF 06/10/2019   Influenza,inj,Quad PF,6+ Mos 10/09/2014,  07/13/2015, 11/17/2016, 07/04/2017, 07/10/2018   Influenza-Unspecified 06/20/2020, 07/14/2022   Moderna Covid-19 Fall Seasonal Vaccine 44yrs & older 08/02/2023   Moderna Sars-Covid-2 Vaccination 11/27/2019, 12/26/2019, 06/22/2020, 12/30/2020, 06/21/2021   Pneumococcal Conjugate-13 03/23/2019   Pneumococcal Polysaccharide-23 08/22/2020   Pneumococcal-Unspecified 02/12/2010   Tdap 02/12/2010, 10/17/2016   Zoster Recombinant(Shingrix) 02/20/2021    Family History  Problem Relation Age of Onset   Cancer Mother    Diabetes Mother    Hypertension Mother    Hyperlipidemia Mother    Cancer Father    Hyperlipidemia Father    Mental illness Sister    Diabetes Sister    Diabetes Maternal Grandmother    Diabetes Maternal Grandfather    Colon cancer Neg Hx    Esophageal cancer Neg Hx    Stomach cancer Neg Hx    Rectal cancer Neg Hx    Tremor Neg Hx    Parkinson's disease Neg Hx      Current Outpatient Medications:    acetaminophen (TYLENOL) 500 MG tablet, Take 1,000 mg by mouth every 6 (six) hours as needed (for pain)., Disp: , Rfl:    albuterol (PROVENTIL) (2.5 MG/3ML) 0.083% nebulizer solution, INHALE 3 ML BY NEBULIZATION EVERY 6 HOURS AS NEEDED FOR WHEEZING OR SHORTNESS OF BREATH, Disp: 180 mL, Rfl: 5   albuterol (VENTOLIN HFA) 108 (90 Base) MCG/ACT inhaler, TAKE 2 PUFFS BY MOUTH EVERY 6 HOURS AS NEEDED FOR WHEEZE OR SHORTNESS OF BREATH (Patient taking differently: Inhale 2 puffs into the lungs every 6 (six)  hours as needed for wheezing or shortness of breath.), Disp: 8.5 each, Rfl: 2   ALPRAZolam (XANAX) 0.25 MG tablet, TAKE 1-2 TABLETS (0.25-0.5 MG TOTAL) BY MOUTH 2 (TWO) TIMES DAILY AS NEEDED FOR ANXIETY. CAN ALSO TAKE A DOSE AS NEEDED FOR INSOMNIA, Disp: 30 tablet, Rfl: 1   apixaban (ELIQUIS) 5 MG TABS tablet, Take 1 tablet (5 mg total) by mouth 2 (two) times daily., Disp: 180 tablet, Rfl: 3   atorvastatin (LIPITOR) 80 MG tablet, Take 1 tablet (80 mg total) by mouth every evening., Disp: 90 tablet, Rfl: 3   cetirizine (ZYRTEC) 10 MG tablet, Take 10 mg by mouth daily as needed for allergies., Disp: , Rfl:    Continuous Glucose Sensor (DEXCOM G7 SENSOR) MISC, 1 Device by Does not apply route as directed., Disp: 9 each, Rfl: 3   cyclobenzaprine (FLEXERIL) 5 MG tablet, Take 1 tablet (5 mg total) by mouth 3 (three) times daily as needed for muscle spasms., Disp: 15 tablet, Rfl: 0   diltiazem (CARDIZEM CD) 360 MG 24 hr capsule, Take 1 capsule (360 mg total) by mouth daily., Disp: 90 capsule, Rfl: 1   folic acid (FOLVITE) 1 MG tablet, Take 2 tablets (2 mg total) by mouth daily. (Patient taking differently: Take 1 mg by mouth daily.), Disp: 180 tablet, Rfl: 3   furosemide (LASIX) 20 MG tablet, Take 1 tablet (20 mg total) by mouth See admin instructions. Take 20 mg by mouth in the morning and an additional 20 mg for an overnight weight gain of 3 pounds or 5 pounds in a week, Disp: 90 tablet, Rfl: 6   glucose blood (CONTOUR NEXT TEST) test strip, 3x daily, Disp: 300 each, Rfl: 11   hydroxychloroquine (PLAQUENIL) 200 MG tablet, Take 200 mg by mouth 2 (two) times daily., Disp: , Rfl:    Insulin Disposable Pump (OMNIPOD 5 G7 PODS, GEN 5,) MISC, 1 Device by Does not apply route every other day., Disp: 45 each, Rfl: 3  insulin lispro (HUMALOG) 100 UNIT/ML injection, Max Daily 70 units per pump, Disp: 70 mL, Rfl: 3   Insulin Pen Needle (B-D UF III MINI PEN NEEDLES) 31G X 5 MM MISC, USE DAILY WITH VICTOZA AND LANTUS.,  Disp: 400 each, Rfl: 1   Insulin Pen Needle 31G X 5 MM MISC, 1 Device by Does not apply route 3 (three) times daily., Disp: 150 each, Rfl: 11   isosorbide mononitrate (IMDUR) 30 MG 24 hr tablet, Take 1 tablet (30 mg total) by mouth daily., Disp: 90 tablet, Rfl: 3   Lancets 30G MISC, 1 Device by Does not apply route 3 (three) times daily., Disp: 300 each, Rfl: 9   leflunomide (ARAVA) 20 MG tablet, Take 20 mg by mouth daily., Disp: , Rfl:    losartan (COZAAR) 100 MG tablet, Take 0.5 tablets (50 mg total) by mouth daily., Disp: 45 tablet, Rfl: 3   Melatonin 5 MG CHEW, Chew 10 mg by mouth at bedtime., Disp: , Rfl:    Multiple Vitamin (MULTIVITAMIN WITH MINERALS) TABS tablet, Take 1 tablet by mouth daily with breakfast., Disp: , Rfl:    Oxymetazoline HCl (VICKS SINEX NA), Place 1 spray into both nostrils every 12 (twelve) hours as needed (for congestion- MAX OF THREE CONSECUTIVE DAYS)., Disp: , Rfl:    pantoprazole (PROTONIX) 40 MG tablet, Take 1 tablet (40 mg total) by mouth daily., Disp: 30 tablet, Rfl: 0   SAFETY-LOK TB SYRINGE 27GX.5" 27G X 1/2" 1 ML MISC, , Disp: , Rfl:    sodium chloride (OCEAN) 0.65 % SOLN nasal spray, Place 1 spray into both nostrils as needed. (Patient taking differently: Place 1 spray into both nostrils as needed for congestion.), Disp: 30 mL, Rfl: 5   tirzepatide (MOUNJARO) 12.5 MG/0.5ML Pen, Inject 12.5 mg into the skin once a week., Disp: 6 mL, Rfl: 3   traZODone (DESYREL) 50 MG tablet, Take 0.5-1 tablets (25-50 mg total) by mouth at bedtime as needed. for sleep (Patient taking differently: Take 50 mg by mouth at bedtime.), Disp: 90 tablet, Rfl: 3      Objective:   Vitals:   10/31/23 1323  BP: 124/64  Pulse: 70  SpO2: 95%  Weight: 239 lb 1.6 oz (108.5 kg)  Height: 5' 2.5" (1.588 m)    Estimated body mass index is 43.04 kg/m as calculated from the following:   Height as of this encounter: 5' 2.5" (1.588 m).   Weight as of this encounter: 239 lb 1.6 oz (108.5  kg).  @WEIGHTCHANGE @  [employer]   10/31/23 1323  Weight: 239 lb 1.6 oz (108.5 kg)     Physical Exam   General: No distress. Looks table O2 at rest: yes but does not need it Cane present: no Sitting in wheel chair: no Frail: no Obese: YES Neuro: Alert and Oriented x 3. GCS 15. Speech normal Psych: Pleasant Resp:  Barrel Chest - no.  Wheeze - no, Crackles - some maybe, No overt respiratory distress CVS: Normal heart sounds. Murmurs - no Ext: Stigmata of Connective Tissue Disease - no HEENT: Normal upper airway. PEERL +. No post nasal drip        Assessment:       ICD-10-CM   1. ILD (interstitial lung disease) (HCC)  J84.9 CT Chest High Resolution    2. Hypersensitivity pneumonitis (HCC)  J67.9 CT Chest High Resolution    3. OSA on CPAP  G47.33 CT Chest High Resolution    4. Moderate mitral stenosis by prior echocardiography  I05.0 CT Chest High Resolution         Plan:     Patient Instructions  ILD (interstitial lung disease) (HCC) Hypersensitivity pneumonitis (HCC)  V RA  - stable x  1 year for sure  Plan  - Get HRCT supine and prine in 8 months -Consider antifibrotic's if there is worsening    OSA on CPAP  Plan  - per sleep doc; - cotninue CPAP  Moderate mitral stenosis by prior echocardiography  Plan   - per Dr Herbie Baltimore; you can ask him if you need to be seen at [facility] for 2nd opinion  Chronci anemia  -  stable as of Oct 2024  Plan  - per PCP Copland, Gwenlyn Found, MD   FOllowu;  8 months, 15 min with Khalani Novoa after CT   FOLLOWUP Return in about 8 months (around 06/30/2024) for 15 min visit, after HRCT chest, with Dr Marchelle Gearing, Face to Face OR Video Visit.    SIGNATURE    Dr. Kalman Shan, M.D., F.C.C.P,  Pulmonary and Critical Care Medicine Staff Physician, [facility] Center Director - Interstitial Lung Disease  Program  Pulmonary Fibrosis [facility] at [facility] [place], [place],  [address]  Pager: [phone], If no answer or between  15:00h - 7:00h: call [phone] Telephone: [phone]  2:03 PM 10/31/2023

## 2023-10-31 NOTE — Progress Notes (Signed)
Spirometry/DLCO performed today. 

## 2023-10-31 NOTE — Patient Instructions (Addendum)
ILD (interstitial lung disease) (HCC) Hypersensitivity pneumonitis (HCC)  V RA  - stable x  1 year for sure  Plan  - Get HRCT supine and prine in 8 months -Consider antifibrotic's if there is worsening    OSA on CPAP  Plan  - per sleep doc; - cotninue CPAP  Moderate mitral stenosis by prior echocardiography  Plan   - per Dr Herbie Baltimore; you can ask him if you need to be seen at [facility] for 2nd opinion  Chronci anemia  -  stable as of Oct 2024  Plan  - per PCP Copland, Gwenlyn Found, MD   FOllowu;  8 months, 15 min with Jordell Outten after CT

## 2023-11-05 ENCOUNTER — Other Ambulatory Visit (HOSPITAL_BASED_OUTPATIENT_CLINIC_OR_DEPARTMENT_OTHER): Payer: Self-pay

## 2023-11-05 ENCOUNTER — Other Ambulatory Visit (HOSPITAL_COMMUNITY): Payer: Self-pay

## 2023-11-05 MED ORDER — DILTIAZEM HCL ER COATED BEADS 360 MG PO CP24
360.0000 mg | ORAL_CAPSULE | Freq: Every day | ORAL | 1 refills | Status: DC
  Filled 2023-11-05: qty 90, 90d supply, fill #0

## 2023-11-15 ENCOUNTER — Other Ambulatory Visit (HOSPITAL_COMMUNITY): Payer: Self-pay

## 2023-12-09 ENCOUNTER — Encounter: Payer: Self-pay | Admitting: Family Medicine

## 2023-12-09 DIAGNOSIS — I350 Nonrheumatic aortic (valve) stenosis: Secondary | ICD-10-CM

## 2023-12-23 ENCOUNTER — Encounter: Payer: Self-pay | Admitting: Internal Medicine

## 2023-12-29 ENCOUNTER — Other Ambulatory Visit: Payer: Self-pay | Admitting: Family Medicine

## 2023-12-29 DIAGNOSIS — F410 Panic disorder [episodic paroxysmal anxiety] without agoraphobia: Secondary | ICD-10-CM

## 2023-12-31 ENCOUNTER — Encounter: Payer: Self-pay | Admitting: Internal Medicine

## 2024-01-01 ENCOUNTER — Other Ambulatory Visit: Payer: Self-pay

## 2024-01-01 MED ORDER — DEXCOM G7 SENSOR MISC: 1.0000 | 3 refills | Status: DC

## 2024-01-08 DIAGNOSIS — E1165 Type 2 diabetes mellitus with hyperglycemia: Secondary | ICD-10-CM | POA: Diagnosis not present

## 2024-01-08 DIAGNOSIS — Z794 Long term (current) use of insulin: Secondary | ICD-10-CM | POA: Diagnosis not present

## 2024-01-14 ENCOUNTER — Telehealth: Payer: Self-pay | Admitting: Internal Medicine

## 2024-01-14 NOTE — Telephone Encounter (Signed)
Prescription has been faxed again.

## 2024-01-14 NOTE — Telephone Encounter (Signed)
 New message    Waiting on prescription for patient Continuous Glucose Sensor (DEXCOM G7 SENSOR) MISC

## 2024-01-16 DIAGNOSIS — I081 Rheumatic disorders of both mitral and tricuspid valves: Secondary | ICD-10-CM | POA: Diagnosis not present

## 2024-01-16 DIAGNOSIS — I517 Cardiomegaly: Secondary | ICD-10-CM | POA: Diagnosis not present

## 2024-01-16 DIAGNOSIS — I3481 Nonrheumatic mitral (valve) annulus calcification: Secondary | ICD-10-CM | POA: Diagnosis not present

## 2024-01-16 DIAGNOSIS — Z87891 Personal history of nicotine dependence: Secondary | ICD-10-CM | POA: Diagnosis not present

## 2024-01-16 DIAGNOSIS — I2510 Atherosclerotic heart disease of native coronary artery without angina pectoris: Secondary | ICD-10-CM | POA: Diagnosis not present

## 2024-01-16 DIAGNOSIS — I050 Rheumatic mitral stenosis: Secondary | ICD-10-CM | POA: Diagnosis not present

## 2024-01-16 DIAGNOSIS — Z862 Personal history of diseases of the blood and blood-forming organs and certain disorders involving the immune mechanism: Secondary | ICD-10-CM | POA: Diagnosis not present

## 2024-01-17 ENCOUNTER — Telehealth: Payer: Self-pay

## 2024-01-17 ENCOUNTER — Telehealth: Payer: Self-pay | Admitting: Family Medicine

## 2024-01-17 ENCOUNTER — Encounter: Payer: Self-pay | Admitting: Cardiology

## 2024-01-17 DIAGNOSIS — I050 Rheumatic mitral stenosis: Secondary | ICD-10-CM

## 2024-01-17 DIAGNOSIS — I5032 Chronic diastolic (congestive) heart failure: Secondary | ICD-10-CM

## 2024-01-17 DIAGNOSIS — I2510 Atherosclerotic heart disease of native coronary artery without angina pectoris: Secondary | ICD-10-CM

## 2024-01-17 NOTE — Telephone Encounter (Signed)
 Called and spoke to Dr. Bevelyn Ngo with [facility]  Dr. Bevelyn Ngo states:   -Recently saw patient due to request of 2nd opinion   -he agrees with Dr. Erich Montane findings   -Mitral stenosis not not severe enough for surgery   -Best to observe for now and not proceed with surgery   -Dr. Herbie Baltimore can outreach to discuss further [phone])  Informed Dr. Ramon Dredge message sent to Dr. Herbie Baltimore

## 2024-01-17 NOTE — Telephone Encounter (Signed)
 Dr. Bevelyn Ngo with [facility] called in asking to speak with Dr. Herbie Baltimore at his earliest convenience.   Best c/b: [phone]

## 2024-01-17 NOTE — Telephone Encounter (Signed)
 We received a fax from insurance stating that the pts Alprazolam 0.25mg  is either on the pts plan but subjected to certain limits OR Is not on her formulary. They provided a temporary supply for the 12/30/23 fill.   Form is in the folder and explains more.

## 2024-01-17 NOTE — Telephone Encounter (Signed)
 [facility] called, had a doctor Benard Rink to speak to Dr Dallas Schimke about this pt which was in sugery. [facility] was going to leave a CRM

## 2024-01-17 NOTE — Telephone Encounter (Signed)
 Copied from CRM [id number]. Topic: General - Other >> Jan 17, 2024  9:52 AM Eunice Blase wrote: Reason for CRM: [facility] Patsy Lager, Please call [phone] regarding follow up on pt.

## 2024-01-17 NOTE — Telephone Encounter (Signed)
 Called Dr Imogene Burn back- at this point he does not think she would benefit from mitral valve surgery. They recommended that she do anther echo in about 6 months but otherwise NTD right now

## 2024-01-20 ENCOUNTER — Encounter: Payer: Self-pay | Admitting: Cardiology

## 2024-01-20 ENCOUNTER — Telehealth: Payer: Self-pay

## 2024-01-20 MED ORDER — LOSARTAN POTASSIUM 100 MG PO TABS: 50.0000 mg | ORAL_TABLET | Freq: Every day | ORAL | 3 refills | Status: AC

## 2024-01-20 NOTE — Telephone Encounter (Signed)
 Refill request rec'd for pantoprazole 40 mg once daily. Prior Dr. Russella Dar patient last seen by Dr. Barron Alvine in 07-2023 for balloon enteroscopy.  As DOD, please advise if OK to refill for 30 to 60 days but advise patient she needs an appointment. Thank you

## 2024-01-23 MED ORDER — PANTOPRAZOLE SODIUM 40 MG PO TBEC: 40.0000 mg | DELAYED_RELEASE_TABLET | Freq: Every day | ORAL | 0 refills | Status: DC

## 2024-01-23 NOTE — Addendum Note (Signed)
 Addended by: Cooper Render on: 01/23/2024 02:40 PM   Modules accepted: Orders

## 2024-01-23 NOTE — Telephone Encounter (Signed)
 Scheduled patient for appointment on 5-20 with Doug Sou at 10 am. Refill sent for 90 day supply and message to patient about appt

## 2024-01-23 NOTE — Telephone Encounter (Signed)
 Please send refill for 90 days and schedule follow up visit with APP next available. Thanks

## 2024-01-28 NOTE — Telephone Encounter (Signed)
Letter mailed to patient with appointment information 

## 2024-02-06 DIAGNOSIS — Z794 Long term (current) use of insulin: Secondary | ICD-10-CM | POA: Diagnosis not present

## 2024-02-06 DIAGNOSIS — E1165 Type 2 diabetes mellitus with hyperglycemia: Secondary | ICD-10-CM | POA: Diagnosis not present

## 2024-02-07 ENCOUNTER — Encounter: Payer: Self-pay | Admitting: Cardiology

## 2024-02-07 NOTE — Telephone Encounter (Signed)
     Telephone call discussion.  I called and talked to Dr. Farrel Hones.  We both agreed that their echocardiogram results are very similar to the one that we have had in the previous studies.  She will have follow-up echocardiogram with them in October 2025 and is due to follow-up with Dr. Farrel Hones after.  The agreement is that given her comorbidities and the fact that the valve is still opening, perhaps the best option would be to continue with expectant management based on the the difficulty of the potential much replacement.  Randene Bustard, MD

## 2024-02-16 ENCOUNTER — Other Ambulatory Visit: Payer: Self-pay | Admitting: Physician Assistant

## 2024-02-25 ENCOUNTER — Other Ambulatory Visit: Payer: Self-pay | Admitting: Internal Medicine

## 2024-02-29 ENCOUNTER — Encounter: Payer: Self-pay | Admitting: Family Medicine

## 2024-03-03 ENCOUNTER — Ambulatory Visit: Admitting: Gastroenterology

## 2024-03-03 ENCOUNTER — Encounter: Payer: Self-pay | Admitting: Gastroenterology

## 2024-03-03 VITALS — BP 122/68 | HR 69 | Ht 63.0 in | Wt 241.0 lb

## 2024-03-03 DIAGNOSIS — I4891 Unspecified atrial fibrillation: Secondary | ICD-10-CM

## 2024-03-03 DIAGNOSIS — J849 Interstitial pulmonary disease, unspecified: Secondary | ICD-10-CM

## 2024-03-03 DIAGNOSIS — D649 Anemia, unspecified: Secondary | ICD-10-CM | POA: Diagnosis not present

## 2024-03-03 DIAGNOSIS — Z9981 Dependence on supplemental oxygen: Secondary | ICD-10-CM | POA: Diagnosis not present

## 2024-03-03 DIAGNOSIS — Z87891 Personal history of nicotine dependence: Secondary | ICD-10-CM

## 2024-03-03 DIAGNOSIS — K922 Gastrointestinal hemorrhage, unspecified: Secondary | ICD-10-CM | POA: Diagnosis not present

## 2024-03-03 DIAGNOSIS — D569 Thalassemia, unspecified: Secondary | ICD-10-CM

## 2024-03-03 DIAGNOSIS — K5521 Angiodysplasia of colon with hemorrhage: Secondary | ICD-10-CM

## 2024-03-03 DIAGNOSIS — I050 Rheumatic mitral stenosis: Secondary | ICD-10-CM

## 2024-03-03 DIAGNOSIS — Z7901 Long term (current) use of anticoagulants: Secondary | ICD-10-CM | POA: Diagnosis not present

## 2024-03-03 DIAGNOSIS — J449 Chronic obstructive pulmonary disease, unspecified: Secondary | ICD-10-CM | POA: Diagnosis not present

## 2024-03-03 MED ORDER — PANTOPRAZOLE SODIUM 40 MG PO TBEC: 40.0000 mg | DELAYED_RELEASE_TABLET | Freq: Every day | ORAL | 3 refills | Status: AC

## 2024-03-03 NOTE — Progress Notes (Signed)
 03/03/2024 Lynn [name] [id number] 03-Feb-1956   HISTORY OF PRESENT ILLNESS: This is a 68 year old female was previously patient of Dr. Emaline Handsome.  Her care will be assumed by Dr. Leonia Raman.  She has atrial fibrillation for which she is on chronic Eliquis , history of recurrent melena/GI bleed secondary to AVMs and at one point had a clean-based gastric ulcer.  She also has COPD on chronic home O2 at 3 L nasal cannula, prior history of CVA, hyperlipidemia, RA on Plaquenil , diabetes mellitus, and thalassemia as well as morbid obesity.  She was evaluated by GI in March 2024 when she was hospitalized with melena and underwent EGD which showed 1 nonbleeding gastric ulcer, and a small hiatal hernia. She underwent enteroscopy in November 2023 for similar presentation with finding of 2 small AVMs in the cardia and antrum of the stomach treated with APC and 1 small AVM in the proximal jejunum also treated with APC. Last colonoscopy 2017 done for rectal bleeding noted multiple diverticuli in the sigmoid, descending and transverse colon, otherwise negative.  She was then hospitalized and evaluated in October 2024 by our service for the same issues.  She received 3 units of packed red blood cells during that admission.  07/17/2023 small bowel enteroscopy/capsule endoscopy with evidence of bleeding in small bowel beyond reach of the endoscope.  08/13/2023 small bowel endoscopy: - Small hiatal hernia. - Normal stomach. - Normal examined duodenum. - A tattoo was seen in the jejunum. - Two non- bleeding angioectasias in the jejunum. Treated with argon plasma coagulation ( APC) . - No active bleeding or blood in the examined small bowel. - No specimens collected.  Plan was to continue pantoprazole  40 mg daily, monitor blood counts, and if any recurrent bleeding then will need to be seen at a [facility] center for double-balloon enteroscopy.  She is here today basically because she needed her  pantoprazole  refilled.  Has not been seen for follow-up since her hospitalization in October.  Last hemoglobin when checked last month was 8.1 g.  She says that she has had no sign of bleeding with black or bloody stools.  Gets labs performed regularly to her PCP, cardiology, etc.  Iron studies look fair.  She does not take oral iron supplements.    Past Medical History:  Diagnosis Date   Allergy 2018   Anxiety    CHF (congestive heart failure) (HCC)    Clotting disorder (HCC)    blood clot in eye 2016   COPD (chronic obstructive pulmonary disease) (HCC)    Coronary artery disease, non-occlusive    a. 11/2016 NSTEMI/Cath: LM nl, LAD 40p, 50md, D1/2 small, LCX 40ost, OM2/3 nl/small, RCA 35 ost/mid, RPDA/RPL small/nl.   Coronary vasospasm (HCC)    Depression    Diastolic dysfunction    a. 11/2016 Echo: EF 55-60%, gr1 DD, sev calcified MV annulus, mildly dil LA.   Eye hemorrhage 01/2015   "right; resolved" (07/03/2017)   Gastric ulcer    GERD (gastroesophageal reflux disease)    Headache    "monthly" (07/03/2017)   Heart murmur    History of blood transfusion    "when I had an ectopic pregnancy"   History of stomach ulcers    Hyperlipidemia    Hypertension    Microcytic anemia    Moderate mitral stenosis by prior echocardiogram 03/2021   TTE: Mod MS (mean gradient 9 mmHg). -- TEE Moderate-Severe MS (mean gradient 14 mmHg) with fixed Post MV Leaflet & Severe MAC w/ large  circumscribed calcified mass on the Post MV Leaflet. (Mass previously described in 2018) - CMRI identifies calcified mass & not Tumor.; R&LHC - LVEDP & PCWP both 23 mmHg indicating minimal resting gradient   Myocardial infarction (HCC) 2023   OSA on CPAP    setting is unknown   Oxygen  deficiency    PAF (paroxysmal atrial fibrillation) (HCC) 03/2021   Event Monitor Aug-Sept 2022: Predominantly sinus rhythm.  1% A. fib burden.  30 brief episodes of PAT.  2 short bursts of NSVT.   PAT (paroxysmal atrial tachycardia) (HCC)     Event monitor from August 2022 showed 30 brief bursts longest 14 seconds.   Pneumonia    "several times" (07/03/2017)   PVC's (premature ventricular contractions)    Rheumatoid arthritis (HCC)    Sickle cell anemia (HCC)    Sleep apnea    Stroke (HCC)    Syncope 07/03/2017 X 2   called seizures but no medications   Thalassemia    "my cells are sickle cell shaped but I don't have sickle cell anemia" (07/03/2017)   Type II diabetes mellitus (HCC)    Past Surgical History:  Procedure Laterality Date   48-Hour Monitor  03/18/2017   Sinus rhythm with sinus tachycardia (rate 58-134 BPM)multiple PVCs noted with couplets and bigeminy. One triplet. 7 runs of PAT ranging from 100-130 bpm. Longest was 33 beats.   BALLOON ENTEROSCOPY N/A 08/13/2023   Procedure: BALLOON ENTEROSCOPY;  Surgeon: Annis Kinder, DO;  Location: [facility];  Service: Gastroenterology;  Laterality: N/A;   BIOPSY  04/03/2021   Procedure: BIOPSY;  Surgeon: Ace Holder, MD;  Location: [facility];  Service: Gastroenterology;;   BIOPSY  12/17/2022   Procedure: BIOPSY;  Surgeon: Tobin Forts, MD;  Location: [facility];  Service: Gastroenterology;;   BREAST BIOPSY Left    BRONCHIAL WASHINGS  03/28/2021   Procedure: BRONCHIAL WASHINGS;  Surgeon: Joesph Mussel, DO;  Location: [facility];  Service: Endoscopy;;   CARDIAC CATHETERIZATION N/A 11/19/2016   Procedure: Left Heart Cath and Coronary Angiography;  Surgeon: Arty Binning, MD;  Location: [facility] INVASIVE CV LAB;  Service: Cardiovascular.    LM nl, LAD 40p, 50md, D1/2 small, LCX 40ost, OM2/3 nl/small, RCA 35 ost/mid, RPDA/RPL small/nl.   CARDIAC MRI  03/30/2021   Normal LVEF ~53%. 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 No Scar or Late Gadolinium Enhancement on LV Myocardium.   CARPAL TUNNEL RELEASE Right 11/01/2014   COLONOSCOPY     DILATION AND CURETTAGE OF UTERUS     ECTOPIC PREGNANCY SURGERY  X 2    ENTEROSCOPY N/A 09/05/2022   Procedure: ENTEROSCOPY;  Surgeon: Brice Campi Albino Alu., MD;  Location: [facility];  Service: Gastroenterology;  Laterality: N/A;   ENTEROSCOPY N/A 07/17/2023   Procedure: ENTEROSCOPY;  Surgeon: Tobin Forts, MD;  Location: [facility];  Service: Gastroenterology;  Laterality: N/A;   ESOPHAGOGASTRODUODENOSCOPY N/A 12/17/2022   Procedure: ESOPHAGOGASTRODUODENOSCOPY (EGD);  Surgeon: Tobin Forts, MD;  Location: [facility];  Service: Gastroenterology;  Laterality: N/A;   ESOPHAGOGASTRODUODENOSCOPY (EGD) WITH PROPOFOL  N/A 04/03/2021   Procedure: ESOPHAGOGASTRODUODENOSCOPY (EGD) WITH PROPOFOL ;  Surgeon: Ace Holder, MD;  Location: [facility];  Service: Gastroenterology;  Laterality: N/A;   ESOPHAGOGASTRODUODENOSCOPY (EGD) WITH PROPOFOL  N/A 08/15/2021   Procedure: ESOPHAGOGASTRODUODENOSCOPY (EGD) WITH PROPOFOL ;  Surgeon: Asencion Blacksmith, MD;  Location: [facility];  Service: Endoscopy;  Laterality: N/A;   GIVENS CAPSULE STUDY N/A 07/17/2023   Procedure: GIVENS CAPSULE  STUDY;  Surgeon: Tobin Forts, MD;  Location: [facility];  Service: Gastroenterology;  Laterality: N/A;   HOT HEMOSTASIS N/A 09/05/2022   Procedure: HOT HEMOSTASIS (ARGON PLASMA COAGULATION/BICAP);  Surgeon: Normie Becton., MD;  Location: [facility];  Service: Gastroenterology;  Laterality: N/A;   HOT HEMOSTASIS N/A 08/13/2023   Procedure: HOT HEMOSTASIS (ARGON PLASMA COAGULATION/BICAP);  Surgeon: Annis Kinder, DO;  Location: [facility];  Service: Gastroenterology;  Laterality: N/A;   JOINT REPLACEMENT     KNEE ARTHROSCOPY Left 11/2014   RIGHT/LEFT HEART CATH AND CORONARY ANGIOGRAPHY N/A 08/08/2021   Procedure: RIGHT/LEFT HEART CATH AND CORONARY ANGIOGRAPHY;  Surgeon: Arleen Lacer, MD;  Location: [facility] INVASIVE CV LAB;  Service: Cardiovascular;Ost LCx 40%. Ost-Prox RCA 35%. Prox-Mid LAD 25%-<25% D2. Mod-Severely Elevated LVEDP. Mild PA HTN -> PA mean 31 mmHg with a PCWP  and LVEDP of 23 mmHg   SUBMUCOSAL TATTOO INJECTION  09/05/2022   Procedure: SUBMUCOSAL TATTOO INJECTION;  Surgeon: Normie Becton., MD;  Location: [facility];  Service: Gastroenterology;;   SUBMUCOSAL TATTOO INJECTION  07/17/2023   Procedure: SUBMUCOSAL TATTOO INJECTION;  Surgeon: Tobin Forts, MD;  Location: [facility];  Service: Gastroenterology;;   TEE WITHOUT CARDIOVERSION N/A 03/28/2021   Procedure: TRANSESOPHAGEAL ECHOCARDIOGRAM (TEE);  Surgeon: Lake Pilgrim, MD;  Location: [facility] ENDOSCOPY;; LVEF 55-60%. Normal LV Fxn & no RWMA. No LAA thrombus. Gr II Ao Plaque. Large circumscribed calcific mass (1.8 x 1.4 cm) anterior to posterior annulus.  Fixed posterior leaflet with Mod-Severe MS (Mean MVG ~14 mmHg, Peak 20 mmhg).  Mild MR   TONSILLECTOMY     TOTAL KNEE ARTHROPLASTY Right 02/18/2015   Procedure: RIGHT TOTAL KNEE ARTHROPLASTY;  Surgeon: Winston Hawking, MD;  Location: [facility] OR;  Service: Orthopedics;  Laterality: Right;   TOTAL KNEE ARTHROPLASTY Left 08/19/2015   Procedure: LEFT TOTAL KNEE ARTHROPLASTY;  Surgeon: Winston Hawking, MD;  Location: [facility] OR;  Service: Orthopedics;  Laterality: Left;   TRANSTHORACIC ECHOCARDIOGRAM  11/2016; 07/2017:   a) normal LV size, thickness and function. EF 55-60%. GR 1 DD.No RWMA severe mitral annular calcification, but no mitral stenosis. Mild LA dilation;; b) normal LV size and function.  EF 65-75%.  Unable to assess diastolic function.  Aortic sclerosis with no stenosis.  Moderate mitral stenosis? (Gradient 9 mmHg) -?  Mild-mod left atrial enlargement    TRANSTHORACIC ECHOCARDIOGRAM  01/2019   EF 65 to 70%.  Normal function.  Elevated LVEDP-GR 1 DD.  Normal RV size and function.  Large calcific mass on posterior mitral leaflet mild to moderate mitral stenosis.   TRANSTHORACIC ECHOCARDIOGRAM  03/24/2021   EF 60 to 65%.  LV function with normal wall motion.  Moderate basal septal LVH.  GRII DD & Mild LA dilation.-> elevated LVEDP.  Normal RV function.   Mildly elevated PAP.  Ill-defined density measuring 3.23 x 2.1 cm region of the posterior MV leaflet along with dense MAC.  Moderate MS (mean MVG of 9 mmHg.) Normal AoV & RAP. --> Recommend TEE 2/2 concern for MV SBE.   TRANSTHORACIC ECHOCARDIOGRAM  04/26/2022   EF 60 to 65%.  No RWMA.  Moderate LVH with GR 1 DD.  Normal RV size and function.  Mild LA dilation.  Large calcific mass of posterior MV leaflet.  Mean PG 13 mm 3.  Trivial MR.  Mild to moderate MS.  Severe MAC.  Normal AOV.  Normal RAP.   VAGINAL HYSTERECTOMY     VIDEO BRONCHOSCOPY N/A 03/28/2021   Procedure: VIDEO  BRONCHOSCOPY WITHOUT FLUORO;  Surgeon: Joesph Mussel, DO;  Location: [facility] ENDOSCOPY;  Service: Endoscopy;  Laterality: N/A;    reports that she quit smoking about 9 years ago. Her smoking use included cigarettes. She started smoking about 53 years ago. She has a 11 pack-year smoking history. She has never used smokeless tobacco. She reports that she does not currently use alcohol. She reports that she does not use drugs. family history includes Cancer in her father and mother; Diabetes in her maternal grandfather, maternal grandmother, mother, and sister; Hyperlipidemia in her father and mother; Hypertension in her mother; Mental illness in her sister. Allergies  Allergen Reactions   Methotrexate Other (See Comments)    Pneumonitis   Penicillins Hives    Childhood allergy Has patient had a PCN reaction causing immediate rash, facial/tongue/throat swelling, SOB or lightheadedness with hypotension: Yes Has patient had a PCN reaction causing severe rash involving mucus membranes or skin necrosis: No Has patient had a PCN reaction that required hospitalization No Has patient had a PCN reaction occurring within the last 10 years: No If all of the above answers are "NO", then may proceed with Cephalosporin use.   Farxiga  [Dapagliflozin ] Other (See Comments)    Dizzy and lethargic    Metformin  And Related Diarrhea and Other (See  Comments)    Also, bleeding      Outpatient Encounter Medications as of 03/03/2024  Medication Sig   acetaminophen  (TYLENOL ) 500 MG tablet Take 1,000 mg by mouth every 6 (six) hours as needed (for pain).   albuterol  (PROVENTIL ) (2.5 MG/3ML) 0.083% nebulizer solution INHALE 3 ML BY NEBULIZATION EVERY 6 HOURS AS NEEDED FOR WHEEZING OR SHORTNESS OF BREATH   albuterol  (VENTOLIN  HFA) 108 (90 Base) MCG/ACT inhaler TAKE 2 PUFFS BY MOUTH EVERY 6 HOURS AS NEEDED FOR WHEEZE OR SHORTNESS OF BREATH   ALPRAZolam  (XANAX ) 0.25 MG tablet TAKE 1-2 TABLETS BY MOUTH 2 TIMES DAILY AS NEEDED FOR ANXIETY. CAN ALSO TAKE A DOSE AS NEEDED FOR INSOMNIA   apixaban  (ELIQUIS ) 5 MG TABS tablet Take 1 tablet (5 mg total) by mouth 2 (two) times daily.   atorvastatin  (LIPITOR ) 80 MG tablet Take 1 tablet (80 mg total) by mouth every evening.   cetirizine (ZYRTEC) 10 MG tablet Take 10 mg by mouth daily as needed for allergies.   Continuous Glucose Sensor (DEXCOM G7 SENSOR) MISC 1 Device by Does not apply route as directed.   cyclobenzaprine  (FLEXERIL ) 5 MG tablet Take 1 tablet (5 mg total) by mouth 3 (three) times daily as needed for muscle spasms.   diltiazem  (CARDIZEM  CD) 360 MG 24 hr capsule TAKE 1 CAPSULE BY MOUTH EVERY DAY   folic acid  (FOLVITE ) 1 MG tablet Take 2 tablets (2 mg total) by mouth daily. (Patient taking differently: Take 1 mg by mouth daily.)   furosemide  (LASIX ) 20 MG tablet Take 1 tablet (20 mg total) by mouth See admin instructions. Take 20 mg by mouth in the morning and an additional 20 mg for an overnight weight gain of 3 pounds or 5 pounds in a week   glucose blood (CONTOUR NEXT TEST) test strip 3x daily   hydroxychloroquine  (PLAQUENIL ) 200 MG tablet Take 200 mg by mouth 2 (two) times daily.   Insulin  Disposable Pump (OMNIPOD 5 G7 PODS, GEN 5,) MISC 1 Device by Does not apply route every other day.   insulin  lispro (HUMALOG ) 100 UNIT/ML injection Max Daily 70 units per pump   Insulin  Pen Needle (B-D UF  III MINI  PEN NEEDLES) 31G X 5 MM MISC USE DAILY WITH VICTOZA  AND LANTUS .   Insulin  Pen Needle 31G X 5 MM MISC 1 Device by Does not apply route 3 (three) times daily.   isosorbide  mononitrate (IMDUR ) 30 MG 24 hr tablet Take 1 tablet (30 mg total) by mouth daily.   Lancets 30G MISC 1 Device by Does not apply route 3 (three) times daily.   leflunomide  (ARAVA ) 20 MG tablet Take 20 mg by mouth daily.   losartan  (COZAAR ) 100 MG tablet Take 0.5 tablets (50 mg total) by mouth daily.   Melatonin 5 MG CHEW Chew 10 mg by mouth at bedtime.   Multiple Vitamin (MULTIVITAMIN WITH MINERALS) TABS tablet Take 1 tablet by mouth daily with breakfast.   Oxymetazoline  HCl (VICKS SINEX NA) Place 1 spray into both nostrils every 12 (twelve) hours as needed (for congestion- MAX OF THREE CONSECUTIVE DAYS).   pantoprazole  (PROTONIX ) 40 MG tablet Take 1 tablet (40 mg total) by mouth daily. Please come for an Office Visit with Jessice Seher Schlagel, PA on 03-03-24 at 10:00 am for additional refills. Thank you   SAFETY-LOK TB SYRINGE 27GX.5" 27G X 1/2" 1 ML MISC    sodium chloride  (OCEAN) 0.65 % SOLN nasal spray Place 1 spray into both nostrils as needed. (Patient taking differently: Place 1 spray into both nostrils as needed for congestion.)   tirzepatide  (MOUNJARO ) 12.5 MG/0.5ML Pen Inject 12.5 mg into the skin once a week.   traZODone  (DESYREL ) 50 MG tablet Take 0.5-1 tablets (25-50 mg total) by mouth at bedtime as needed. for sleep (Patient taking differently: Take 50 mg by mouth at bedtime.)   No facility-administered encounter medications on file as of 03/03/2024.     REVIEW OF SYSTEMS  : All other systems reviewed and negative except where noted in the History of Present Illness.   PHYSICAL EXAM: BP 122/68   Pulse 69   Ht 5\' 3"  (1.6 m)   Wt 241 lb (109.3 kg)   LMP  (LMP Unknown)   BMI 42.69 kg/m  General: Well developed female in no acute distress; in wheelchair with O2 Head: Normocephalic and atraumatic Eyes:   Sclerae anicteric, conjunctiva pink. Ears: Normal auditory acuity Lungs: Clear throughout to auscultation Heart: Regular rate and rhythm Musculoskeletal: Symmetrical with no gross deformities  Skin: No lesions on visible extremities Extremities: No edema  Neurological: Alert oriented x 4, grossly non-focal Psychological:  Alert and cooperative. Normal mood and affect  ASSESSMENT AND PLAN: Recurrent anemia acute on chronic, s/p GI bleed, known AVMs, on chronic Eliquis  for atrial fibrillation.  Had video capsule endoscopy and small bowel endoscopy x 2 in October 2024.  New set of recurrent bleeding since then.  Hemoglobin staying around 8 g range.  She does also have thalassemia. -Continue Pantoprazole  40mg  po daily.  Prescription sent to pharmacy. -Hemoglobin to be monitored per PCP periodically. -If any recurrent bleeding then will likely need to be seen at a quaternary center for double-balloon enteroscopy.   Atrial fibrillation on Eliquis    Interstitial lung disease/COPD on chronic oxygen  3 L at home   Mitral stenosis- Last echo earlier this year EF 60 to 65% moderate mitral stenosis no AS.    CC:  Copland, Skipper Dumas, MD

## 2024-03-03 NOTE — Patient Instructions (Addendum)
 We have sent the following medications to your pharmacy for you to pick up at your convenience: Pantoprazole   _______________________________________________________  If your blood pressure at your visit was 140/90 or greater, please contact your primary care physician to follow up on this.  _______________________________________________________  If you are age 70 or older, your body mass index should be between 23-30. Your Body mass index is 42.69 kg/m. If this is out of the aforementioned range listed, please consider follow up with your Primary Care Provider.  If you are age 23 or younger, your body mass index should be between 19-25. Your Body mass index is 42.69 kg/m. If this is out of the aformentioned range listed, please consider follow up with your Primary Care Provider.   ________________________________________________________  The [facility] providers would like to encourage you to use MYCHART to communicate with providers for non-urgent requests or questions.  Due to long hold times on the telephone, sending your provider a message by [facility] may be a faster and more efficient way to get a response.  Please allow 48 business hours for a response.  Please remember that this is for non-urgent requests.  _______________________________________________________  I appreciate the opportunity to care for you. Jessica Zehr, PA

## 2024-03-29 ENCOUNTER — Other Ambulatory Visit: Payer: Self-pay | Admitting: Nurse Practitioner

## 2024-04-20 DIAGNOSIS — Z794 Long term (current) use of insulin: Secondary | ICD-10-CM | POA: Diagnosis not present

## 2024-04-20 DIAGNOSIS — E1165 Type 2 diabetes mellitus with hyperglycemia: Secondary | ICD-10-CM | POA: Diagnosis not present

## 2024-04-21 ENCOUNTER — Other Ambulatory Visit: Payer: Self-pay | Admitting: Internal Medicine

## 2024-04-27 LAB — HM DIABETES EYE EXAM

## 2024-05-13 ENCOUNTER — Other Ambulatory Visit: Payer: Self-pay | Admitting: Internal Medicine

## 2024-05-22 DIAGNOSIS — Z794 Long term (current) use of insulin: Secondary | ICD-10-CM | POA: Diagnosis not present

## 2024-05-22 DIAGNOSIS — E1165 Type 2 diabetes mellitus with hyperglycemia: Secondary | ICD-10-CM | POA: Diagnosis not present

## 2024-06-03 ENCOUNTER — Encounter: Payer: Self-pay | Admitting: Cardiology

## 2024-06-03 MED ORDER — FUROSEMIDE 20 MG PO TABS: 20.0000 mg | ORAL_TABLET | ORAL | 3 refills | Status: DC

## 2024-06-03 NOTE — Telephone Encounter (Signed)
 Spoke to patient she stated she has increased swelling in both lower legs.She is sob.Her weight has increased.She has been eating salt.Advised of low salt diet.Lasix  refill sent to her pharmacy.Appointment scheduled with Dr.Harding 8/25 at 1:20 pm.

## 2024-06-04 ENCOUNTER — Other Ambulatory Visit: Payer: Self-pay | Admitting: Family Medicine

## 2024-06-04 DIAGNOSIS — G4700 Insomnia, unspecified: Secondary | ICD-10-CM

## 2024-06-08 ENCOUNTER — Ambulatory Visit: Attending: Cardiology | Admitting: Cardiology

## 2024-06-08 ENCOUNTER — Encounter: Payer: Self-pay | Admitting: Cardiology

## 2024-06-08 VITALS — BP 145/63 | HR 67 | Ht 63.0 in | Wt 252.6 lb

## 2024-06-08 DIAGNOSIS — I10 Essential (primary) hypertension: Secondary | ICD-10-CM | POA: Diagnosis not present

## 2024-06-08 DIAGNOSIS — I5032 Chronic diastolic (congestive) heart failure: Secondary | ICD-10-CM | POA: Diagnosis not present

## 2024-06-08 DIAGNOSIS — M7989 Other specified soft tissue disorders: Secondary | ICD-10-CM | POA: Diagnosis not present

## 2024-06-08 DIAGNOSIS — R0609 Other forms of dyspnea: Secondary | ICD-10-CM

## 2024-06-08 DIAGNOSIS — E1169 Type 2 diabetes mellitus with other specified complication: Secondary | ICD-10-CM | POA: Diagnosis not present

## 2024-06-08 DIAGNOSIS — I050 Rheumatic mitral stenosis: Secondary | ICD-10-CM | POA: Diagnosis not present

## 2024-06-08 DIAGNOSIS — E785 Hyperlipidemia, unspecified: Secondary | ICD-10-CM | POA: Diagnosis not present

## 2024-06-08 DIAGNOSIS — R0602 Shortness of breath: Secondary | ICD-10-CM | POA: Diagnosis not present

## 2024-06-08 DIAGNOSIS — I480 Paroxysmal atrial fibrillation: Secondary | ICD-10-CM | POA: Diagnosis not present

## 2024-06-08 DIAGNOSIS — I2510 Atherosclerotic heart disease of native coronary artery without angina pectoris: Secondary | ICD-10-CM | POA: Diagnosis not present

## 2024-06-08 MED ORDER — SPIRONOLACTONE 25 MG PO TABS: 25.0000 mg | ORAL_TABLET | Freq: Every day | ORAL | 3 refills | Status: DC

## 2024-06-08 MED ORDER — FUROSEMIDE 40 MG PO TABS: ORAL_TABLET | ORAL | 3 refills | Status: DC

## 2024-06-08 NOTE — Assessment & Plan Note (Signed)
 Moderate mitral stenosis with mean gradient of 11 mmHg and MVA of 1.8 cm. Seen by Dr. Laurence from [facility] CVTS.  Was not having symptoms at the time.  Echo and coronary CT angiogram results reviewed that were ordered at [facility].  Mild stable disease noted on Coronary CTA in the mitral valve seems to have moderate stenosis. - Re-evaluate with echocardiogram in February. -Likely leading to her worsening diastolic dysfunction. - Increase furosemide  to 40 mg daily. - Add spironolactone  25 mg daily.

## 2024-06-08 NOTE — Assessment & Plan Note (Signed)
 High coronary calcium  score with significant plaque burden. CT angiogram showed stenosis >70% at ostium of D2. No significant ischemic symptoms. - Continue atorvastatin  80 mg daily. - Monitor cholesterol levels. - Follow up with primary care for cholesterol management. => Should be seen PCP soon.  Will await lab results.

## 2024-06-08 NOTE — Assessment & Plan Note (Signed)
 Will continue to treat blood pressure and likely diastolic dysfunction related heart failure.  Increasing diuretic and afterload reduction.  Low threshold to titrate up losartan  dose.  Also continue to encourage COPD management.

## 2024-06-08 NOTE — Progress Notes (Signed)
 Cardiology Office Note:  .   Date:  06/08/2024  ID:  Lynn [name], Lynn [name] 12/07/1955, MRN [id number] PCP/Referring Provider: Watt [name], MD  [facility] Providers Cardiologist:  Alm Clay, MD Cardiology APP:  Madie Jon Garre, [place]     Chief Complaint  Patient presents with   Follow-up    Notes worsening edema and dyspnea.  Intermittent palpitations.  Weight gain.   Cardiac Valve Problem    Moderate mitral stenosis by echo done at [facility].   Coronary Artery Disease    Nonobstructive CAD by cath in October 2022    Patient Profile: .     Avelina Lawrnce Ikea Demicco is a moderately obese 68 y.o. female former smoker with a PMH notable for nonobstructive CAD (history of coronary vasospasm), moderate calcific mitral stenosis, PAF (with history of GI bleeding), PAD HTN, HLD, DM-2, OSA with CKD stage III, seizure disorder and thalassemia who presents here for 29-month follow-up.   PMH Moderate/nonocclusive CAD Moderate Mitral Stenosis-Heavily Calcified => seen by [facility] (Dr. Laurence) recommendation for now is optimization of medical therapy. PAF ILD on home O2 DM-2 HTN HLD CKD 3     Avalene Sealy was last seen on August 27, 2023 by Scot Ford, PA for routine follow-up.  She denied any major bleeding issues.  No issues with lower extremity edema, orthopnea or PND while taking 20 mg Lasix .  Occasionally taking additional dose.  Was on 3 L due to ILD.  Lipids and not been checked-plan was to check them but they were not done.  She was seen at [facility] by Nyla Charlie Sharper, PA - 01/16/2024 after Echocardiogram and CTA performed shows heavily calcified posterior mitral leaflet/regurgitation.  She was felt to be relatively high risk for mitral valve surgery based on history of stroke, rheumatoid arthritis and pulmonary fibrosis on home oxygen  along with diabetes.  However, if medical management is insufficient, can consider high risk surgery.   Recommended heart failure management by primary cardiology.  Subjective  Discussed the use of AI scribe software for clinical note transcription with the patient, who gave verbal consent to proceed.  History of Present Illness Calea Hribar is a 68 year old female with coronary artery disease and mitral stenosis who presents with fluid retention and swelling.  She has a history of coronary artery disease and mitral stenosis. A recent CT angiogram of the heart revealed an enlarged left atrium with extensive nodular mitral calcification and a small-sized mitral annulus. The left ventricle showed hypertrophy of the basal septal wall. Coronary arteries showed proximal calcification with stenosis greater than seventy percent at the ostium of D2. The total coronary calcium  score was 3959, with the most in the RCA at 1999 and the LAD at 1418.  An echocardiogram showed normal left ventricular function with an ejection fraction of greater than 55%, calculated at 69%. There is mild left ventricular diastolic dysfunction, grade two, and normal right ventricular function. An echocardiogram performed at [facility] in April showed a mean gradient of 11 mmHg and an MVA estimated at 1.8 cm.  She experiences irregular heartbeats, described as 'flip flopping' or 'skipping', occurring occasionally even when at rest. No recent episodes of atrial fibrillation or fast, irregular heartbeats. She sleeps on two pillows and has recently experienced waking up at night due to breathing difficulties, which she attributes to her oxygen  coming off. She also needs to sit up to catch her breath upon waking.  She experiences significant swelling, particularly  in her legs, and notes that her weight has been increasing. She attributes her limited mobility to back issues rather than breathing problems. She has a history of gastrointestinal bleeding, with three episodes of bleeding in her intestines, the first being due to an  ulcer, but has not been hospitalized since October of last year.  Her current medications include atorvastatin  80 mg, diltiazem  360 mg, losartan  100 mg (half a tab), furosemide  20 mg as needed, and Mounjaro  for diabetes. She takes two furosemide  tablets daily for swelling. She is also on insulin  (Humalog ).  She has not seen her primary care physician in a while but plans to visit soon. She is due for a cholesterol check and is concerned about her weight, which has not decreased despite being on Mounjaro .  Cardiovascular ROS: positive for - chest pain, dyspnea on exertion, edema, orthopnea, and shortness of breath negative for - irregular heartbeat, paroxysmal nocturnal dyspnea, rapid heart rate, shortness of breath, or syncope or near significant TIA or emesis fugax.  -At baseline she has exertional dyspnea and is on 3 L of oxygen .  ROS:  Review of Systems - Negative except symptoms noted above    Objective   Current Meds  Medication Sig   acetaminophen  (TYLENOL ) 500 MG tablet Take 1,000 mg by mouth every 6 (six) hours as needed (for pain).   albuterol  (PROVENTIL ) (2.5 MG/3ML) 0.083% nebulizer solution INHALE 3 ML BY NEBULIZATION EVERY 6 HOURS AS NEEDED FOR WHEEZING OR SHORTNESS OF BREATH   albuterol  (VENTOLIN  HFA) 108 (90 Base) MCG/ACT inhaler TAKE 2 PUFFS BY MOUTH EVERY 6 HOURS AS NEEDED FOR WHEEZE OR SHORTNESS OF BREATH   ALPRAZolam  (XANAX ) 0.25 MG tablet TAKE 1-2 TABLETS BY MOUTH 2 TIMES DAILY AS NEEDED FOR ANXIETY. CAN ALSO TAKE A DOSE AS NEEDED FOR INSOMNIA   apixaban  (ELIQUIS ) 5 MG TABS tablet Take 1 tablet (5 mg total) by mouth 2 (two) times daily.   atorvastatin  (LIPITOR ) 80 MG tablet Take 1 tablet (80 mg total) by mouth every evening.   cetirizine (ZYRTEC) 10 MG tablet Take 10 mg by mouth daily as needed for allergies.   Continuous Glucose Sensor (DEXCOM G7 SENSOR) MISC 1 Device by Does not apply route as directed.   cyclobenzaprine  (FLEXERIL ) 5 MG tablet Take 1 tablet (5 mg  total) by mouth 3 (three) times daily as needed for muscle spasms.   diltiazem  (CARDIZEM  CD) 360 MG 24 hr capsule TAKE 1 CAPSULE BY MOUTH EVERY DAY   folic acid  (FOLVITE ) 1 MG tablet Take 2 tablets (2 mg total) by mouth daily. (Patient taking differently: Take 1 mg by mouth daily.)   glucose blood (CONTOUR NEXT TEST) test strip 3x daily   hydroxychloroquine  (PLAQUENIL ) 200 MG tablet Take 200 mg by mouth 2 (two) times daily.   Insulin  Disposable Pump (OMNIPOD 5 G7 PODS, GEN 5,) MISC Use 1 pod every other day.   insulin  lispro (HUMALOG ) 100 UNIT/ML injection Max Daily 70 units per pump   Insulin  Pen Needle (B-D UF III MINI PEN NEEDLES) 31G X 5 MM MISC USE DAILY WITH VICTOZA  AND LANTUS .   Insulin  Pen Needle 31G X 5 MM MISC 1 Device by Does not apply route 3 (three) times daily.   isosorbide  mononitrate (IMDUR ) 30 MG 24 hr tablet Take 1 tablet (30 mg total) by mouth daily.   Lancets 30G MISC 1 Device by Does not apply route 3 (three) times daily.   leflunomide  (ARAVA ) 20 MG tablet Take 20 mg by mouth daily.  losartan  (COZAAR ) 100 MG tablet Take 0.5 tablets (50 mg total) by mouth daily.   Melatonin 5 MG CHEW Chew 10 mg by mouth at bedtime.   Multiple Vitamin (MULTIVITAMIN WITH MINERALS) TABS tablet Take 1 tablet by mouth daily with breakfast.   Oxymetazoline  HCl (VICKS SINEX NA) Place 1 spray into both nostrils every 12 (twelve) hours as needed (for congestion- MAX OF THREE CONSECUTIVE DAYS).   pantoprazole  (PROTONIX ) 40 MG tablet Take 1 tablet (40 mg total) by mouth daily before breakfast.   SAFETY-LOK TB SYRINGE 27GX.5 27G X 1/2 1 ML MISC    sodium chloride  (OCEAN) 0.65 % SOLN nasal spray Place 1 spray into both nostrils as needed. (Patient taking differently: Place 1 spray into both nostrils as needed for congestion.)   spironolactone  (ALDACTONE ) 25 MG tablet Take 1 tablet (25 mg total) by mouth daily.   tirzepatide  (MOUNJARO ) 12.5 MG/0.5ML Pen Inject 12.5 mg into the skin once a week.    traZODone  (DESYREL ) 50 MG tablet Take 0.5-1 tablets (25-50 mg total) by mouth at bedtime as needed for sleep. Needs appt   [Dose adjusted] furosemide  (LASIX ) 20 MG tablet Take 1 tablet (20 mg total) by mouth See admin instructions. Take 20 mg by mouth in the morning and an additional 20 mg for an overnight weight gain of 3 pounds or 5 pounds in a week     Studies Reviewed: SABRA   EKG Interpretation Date/Time:  Monday June 08 2024 13:30:52 EDT Ventricular Rate:  67 PR Interval:  194 QRS Duration:  78 QT Interval:  444 QTC Calculation: 469 R Axis:   -14  Text Interpretation: Sinus rhythm with Premature atrial complexes Low voltage QRS Possible Anterolateral infarct (cited on or before 27-Aug-2023) When compared with ECG of 27-Aug-2023 13:29, Premature atrial complexes are now Present Inverted T waves have replaced nonspecific T wave abnormality in Inferior leads Confirmed by Anner Lenis ([id number]) on 06/08/2024 1:36:35 PM    Lab Results  Component Value Date   NA 140 07/24/2023   CL 94 (L) 07/24/2023   K 3.4 (L) 07/24/2023   CO2 33 (H) 07/24/2023   BUN 13 07/24/2023   CREATININE 1.16 (H) 07/24/2023   GFRNONAA 51 (L) 07/24/2023   CALCIUM  9.3 07/24/2023   PHOS 3.6 09/05/2022   ALBUMIN 3.5 07/24/2023   GLUCOSE 151 (H) 07/24/2023   Results LABS Creatinine: 1.3 Chloride: 95 Potassium: 4.1  RADIOLOGY CT angiogram of the heart: Enlarged left atrium with extensive nodular mitral calcification, small sized mitral annulus, hypertrophy of the basal septal wall, proximal calcification in coronary arteries with 50% stenosis in proximal circumflex and mid LAD, >70% stenosis at ostium of D2, co-dominant circulation, total coronary calcium  score 3959, volume 3227, RCA 1999, LAD 1418  DIAGNOSTIC Echocardiogram: Normal left ventricular function with ejection fraction >55%, calculated 69%, mild left ventricular hypertrophy, left atrial pressures with diastolic dysfunction grade 2, normal right  ventricular function, moderate mitral stenosis with no mitral regurgitation, mean gradient 11 mmHg, mitral valve area estimated 1.8 cm CATH: Dominance: RightOstial LCx 40%.  Ostial to proximal RCA 35%.  Proximal to mid LAD 25% with 25% on D2.  Mid-distal LAD 30%.  Severely elevated LVEDP with mild pulm hypertension.  Cardiac output-index 6.43-3.0.  Mean PAP was 31 mmHg with a LVEDP/PCWP of 23 mmHg.  (08/08/2021)     Risk Assessment/Calculations:    CHA2DS2-VASc Score = 6   This indicates a 9.7% annual risk of stroke. The patient's score is based upon: CHF History: 1  HTN History: 1 Diabetes History: 1 Stroke History: 0 Vascular Disease History: 1 Age Score: 1 Gender Score: 1          Physical Exam:   VS:  BP (!) 145/63 (BP Location: Left Arm, Patient Position: Sitting, Cuff Size: Large)   Pulse 67   Ht 5' 3 (1.6 m)   Wt 252 lb 9.6 oz (114.6 kg)   LMP  (LMP Unknown)   SpO2 93%   BMI 44.75 kg/m    Wt Readings from Last 3 Encounters:  06/08/24 252 lb 9.6 oz (114.6 kg)  03/03/24 241 lb (109.3 kg)  10/31/23 239 lb 1.6 oz (108.5 kg)    Physical Exam   GEN: Well nourished, well groomed in no acute distress; morbidly obese; she came in with a motorized wheelchair.  Somewhat chronically ill-appearing. NECK: No JVD; No carotid bruits CARDIAC: Distant heart sounds but otherwise normal S1, S2; RRR, no murmurs, rubs, gallops RESPIRATORY:  Clear to auscultation without rales, wheezing or rhonchi ; nonlabored, good air movement. ABDOMEN: Soft, non-tender, non-distended EXTREMITIES: 1+ bilateral lower extremity edema; No deformity      ASSESSMENT AND PLAN: .    Problem List Items Addressed This Visit       Cardiology Problems   Chronic diastolic CHF (congestive heart failure) (HCC) (Chronic)   She says that her weight is up and she does have edema notable exam.  Unable to really assess for any JVD.  Based on her having more dyspnea and some orthopnea symptoms, I would be  inclined to increase her diuretic dose. Heart failure with preserved ejection fraction with chronic lower extremity edema Chronic lower extremity edema with preserved ejection fraction. Blood pressure elevated, possibly due to fluid retention. Labs: creatinine 1.3, chloride 95, potassium 4.1. - Increase furosemide  to 40 mg daily. - Add spironolactone  25 mg daily. - Order basic metabolic panel (BMP) today. - Recheck BMP in 2-3 weeks if not seeing PCP. - Monitor weight and fluid status.      Relevant Medications   furosemide  (LASIX ) 40 MG tablet   spironolactone  (ALDACTONE ) 25 MG tablet   Other Relevant Orders   Basic metabolic panel with GFR   Brain natriuretic peptide   Basic metabolic panel with GFR   Coronary artery disease, non-occlusive (Chronic)   High coronary calcium  score with significant plaque burden. CT angiogram showed stenosis >70% at ostium of D2. No significant ischemic symptoms. - Continue atorvastatin  80 mg daily. - Monitor cholesterol levels. - Follow up with primary care for cholesterol management. => Should be seen PCP soon.  Will await lab results.      Relevant Medications   furosemide  (LASIX ) 40 MG tablet   spironolactone  (ALDACTONE ) 25 MG tablet   Essential hypertension - Primary (Chronic)   Labile blood pressures.  Has had higher in the readings.  Currently is elevated in the setting of worsening CHF symptoms. Elevated blood pressure, possibly related to fluid retention. Currently on losartan  and diltiazem . - Increase furosemide  to 40 mg daily. - Add spironolactone  25 mg daily. - Consider increasing losartan  if blood pressure remains elevated.      Relevant Medications   furosemide  (LASIX ) 40 MG tablet   spironolactone  (ALDACTONE ) 25 MG tablet   Other Relevant Orders   EKG 12-Lead (Completed)   Basic metabolic panel with GFR   Brain natriuretic peptide   Basic metabolic panel with GFR   Hyperlipidemia associated with type 2 diabetes mellitus (HCC)  (Chronic)   Hyperlipidemia: Is on 80 mg  rosuvastatin.  Target LDL is less than 70. Has not had lipids checked that I can see in over a year. - Asked that she gets lipid checked at PCPs office and send the results to us .  With moderate CAD, recommendation is LDL less than 70.  May need more aggressive management but no recent labs available.  Type 2 diabetes managed with Mounjaro  and insulin . Reports difficulty with weight loss. - Continue Mounjaro  and insulin  regimen.      Relevant Medications   furosemide  (LASIX ) 40 MG tablet   spironolactone  (ALDACTONE ) 25 MG tablet   Moderate mitral stenosis by prior echocardiography (Chronic)   Moderate mitral stenosis with mean gradient of 11 mmHg and MVA of 1.8 cm. Seen by Dr. Laurence from [facility].  Was not having symptoms at the time.  Echo and coronary CT angiogram results reviewed that were ordered at [facility].  Mild stable disease noted on Coronary CTA in the mitral valve seems to have moderate stenosis. - Re-evaluate with echocardiogram in February. -Likely leading to her worsening diastolic dysfunction. - Increase furosemide  to 40 mg daily. - Add spironolactone  25 mg daily.      Relevant Medications   furosemide  (LASIX ) 40 MG tablet   spironolactone  (ALDACTONE ) 25 MG tablet   PAF (paroxysmal atrial fibrillation) (HCC); CHA2DS2-VASc score 7 (Chronic)   Rate well-controlled.  She is not aware of being in A-fib or not.  She is on diltiazem  XT 360 mg daily for rate control. Remains on Eliquis  5 mg twice daily with no melena, hematochezia hematuria epistaxis.      Relevant Medications   furosemide  (LASIX ) 40 MG tablet   spironolactone  (ALDACTONE ) 25 MG tablet   Other Relevant Orders   EKG 12-Lead (Completed)   Basic metabolic panel with GFR   Brain natriuretic peptide   Basic metabolic panel with GFR     Other   DOE (dyspnea on exertion)   Clearly multifactorial as there is, note of her ILD along with a HFpEF and obesity as well as  deconditioning.  Will continue to treat blood pressure and also potential volume overload by increasing her diuretic dose as noted.  Increased to 40 mg twice daily Lasix       SOB (shortness of breath)   Will continue to treat blood pressure and likely diastolic dysfunction related heart failure.  Increasing diuretic and afterload reduction.  Low threshold to titrate up losartan  dose.  Also continue to encourage COPD management.      Other Visit Diagnoses       Localized swelling of both lower extremities       Relevant Orders   Basic metabolic panel with GFR   Brain natriuretic peptide   Basic metabolic panel with GFR              Follow-Up: Return in about 2 months (around 08/08/2024) for [employer].  I spent 53 minutes in the care of [employer] today including reviewing labs (from epic-1 minute), reviewing outside labs from from epic-K PN/PCP (1 minute), reviewing studies (reviewed the echocardiogram, Coronary CTA and cardiac catheterization reports -8 minutes), face to face time discussing treatment options (25 minutes), reviewing records from previous notes Including several APP notes (9 minutes minutes), 13 minutes, and documenting in the encounter.      Signed, Alm MICAEL Clay, MD, MS Alm Clay, M.D., M.S. Interventional Cardiologist  [facility]  Pager # [phone] As noted above

## 2024-06-08 NOTE — Assessment & Plan Note (Signed)
 Labile blood pressures.  Has had higher in the readings.  Currently is elevated in the setting of worsening CHF symptoms. Elevated blood pressure, possibly related to fluid retention. Currently on losartan  and diltiazem . - Increase furosemide  to 40 mg daily. - Add spironolactone  25 mg daily. - Consider increasing losartan  if blood pressure remains elevated.

## 2024-06-08 NOTE — Assessment & Plan Note (Signed)
 Clearly multifactorial as there is, note of her ILD along with a HFpEF and obesity as well as deconditioning.  Will continue to treat blood pressure and also potential volume overload by increasing her diuretic dose as noted.  Increased to 40 mg twice daily Lasix 

## 2024-06-08 NOTE — Assessment & Plan Note (Signed)
 She says that her weight is up and she does have edema notable exam.  Unable to really assess for any JVD.  Based on her having more dyspnea and some orthopnea symptoms, I would be inclined to increase her diuretic dose. Heart failure with preserved ejection fraction with chronic lower extremity edema Chronic lower extremity edema with preserved ejection fraction. Blood pressure elevated, possibly due to fluid retention. Labs: creatinine 1.3, chloride 95, potassium 4.1. - Increase furosemide  to 40 mg daily. - Add spironolactone  25 mg daily. - Order basic metabolic panel (BMP) today. - Recheck BMP in 2-3 weeks if not seeing PCP. - Monitor weight and fluid status.

## 2024-06-08 NOTE — Assessment & Plan Note (Signed)
 Hyperlipidemia: Is on 80 mg rosuvastatin.  Target LDL is less than 70. Has not had lipids checked that I can see in over a year. - Asked that she gets lipid checked at PCPs office and send the results to us .  With moderate CAD, recommendation is LDL less than 70.  May need more aggressive management but no recent labs available.  Type 2 diabetes managed with Mounjaro  and insulin . Reports difficulty with weight loss. - Continue Mounjaro  and insulin  regimen.

## 2024-06-08 NOTE — Assessment & Plan Note (Signed)
 Rate well-controlled.  She is not aware of being in A-fib or not.  She is on diltiazem  XT 360 mg daily for rate control. Remains on Eliquis  5 mg twice daily with no melena, hematochezia hematuria epistaxis.

## 2024-06-08 NOTE — Patient Instructions (Addendum)
 Medication Instructions:    Increase furosemide   to 40 mg daily   Add Spironolactone  25 mg daily   *If you need a refill on your cardiac medications before your next appointment, please call your pharmacy*   Lab Work: today  BMP BNP    In 2 weeks  BMP   If you have labs (blood work) drawn today and your tests are completely normal, you will receive your results only by: MyChart Message (if you have MyChart) OR A paper copy in the mail If you have any lab test that is abnormal or we need to change your treatment, we will call you to review the results.   Testing/Procedures:  Not needed  Follow-Up: At [facility], you and your health needs are our priority.  As part of our continuing mission to provide you with exceptional heart care, we have created designated Provider Care Teams.  These Care Teams include your primary Cardiologist (physician) and Advanced Practice Providers (APPs -  Physician Assistants and Nurse Practitioners) who all work together to provide you with the care you need, when you need it.     Your next appointment:   Keep the Appointment that is schedule in Oct 2025  The format for your next appointment:   In Person  Provider:   Alm Clay, MD

## 2024-06-09 ENCOUNTER — Ambulatory Visit: Payer: Self-pay | Admitting: Cardiology

## 2024-06-09 LAB — BASIC METABOLIC PANEL WITH GFR
BUN/Creatinine Ratio: 11 — ABNORMAL LOW (ref 12–28)
BUN: 14 mg/dL (ref 8–27)
CO2: 27 mmol/L (ref 20–29)
Calcium: 9.5 mg/dL (ref 8.7–10.3)
Chloride: 95 mmol/L — ABNORMAL LOW (ref 96–106)
Creatinine, Ser: 1.3 mg/dL — ABNORMAL HIGH (ref 0.57–1.00)
Glucose: 93 mg/dL (ref 70–99)
Potassium: 3.7 mmol/L (ref 3.5–5.2)
Sodium: 139 mmol/L (ref 134–144)
eGFR: 44 mL/min/1.73 — ABNORMAL LOW (ref >59)

## 2024-06-09 LAB — BRAIN NATRIURETIC PEPTIDE: BNP: 132.3 pg/mL — ABNORMAL HIGH (ref 0.0–100.0)

## 2024-06-14 NOTE — Progress Notes (Unsigned)
 [facility] [address], [address] [place], [place] [address] [phone]  Date:  06/17/2024   Name:  Lynn [name]   DOB:  January 14, 1956   MRN:  [id number]  PCP:  Watt [name], MD    Chief Complaint: No chief complaint on file.   History of Present Illness:  Lynn [name] is a 68 y.o. very pleasant female patient who presents with the following:  Pt seen today for a recheck visit I last saw her in September at which time she was concerned about a recurrent GI bleed -history of paroxysmal atrial tach and fibrillation, on diltiazem  and Eliquis , interstitial lung disease and COPD on oxygen , diabetes, history of CVA, rheumatoid arthritis, hypertension, thalassemia   Seen by cardiology, Dr Anner in the last couple of weeks Chronic diastolic CHF (congestive heart failure) (HCC) (Chronic)     She says that her weight is up and she does have edema notable exam.  Unable to really assess for any JVD.  Based on her having more dyspnea and some orthopnea symptoms, I would be inclined to increase her diuretic dose. Heart failure with preserved ejection fraction with chronic lower extremity edema Chronic lower extremity edema with preserved ejection fraction. Blood pressure elevated, possibly due to fluid retention. Labs: creatinine 1.3, chloride 95, potassium 4.1. - Increase furosemide  to 40 mg daily. - Add spironolactone  25 mg daily. - Order basic metabolic panel (BMP) today. - Recheck BMP in 2-3 weeks if not seeing PCP. - Monitor weight and fluid status.        Relevant Medications    furosemide  (LASIX ) 40 MG tablet    spironolactone  (ALDACTONE ) 25 MG tablet    Other Relevant Orders    Basic metabolic panel with GFR    Brain natriuretic peptide    Basic metabolic panel with GFR    Coronary artery disease, non-occlusive (Chronic)    High coronary calcium  score with significant plaque burden. CT angiogram  showed stenosis >70% at ostium of D2. No significant ischemic symptoms. - Continue atorvastatin  80 mg daily. - Monitor cholesterol levels. - Follow up with primary care for cholesterol management. => Should be seen PCP soon.  Will await lab results.        Relevant Medications    furosemide  (LASIX ) 40 MG tablet    spironolactone  (ALDACTONE ) 25 MG tablet    Essential hypertension - Primary (Chronic)    Labile blood pressures.  Has had higher in the readings.  Currently is elevated in the setting of worsening CHF symptoms. Elevated blood pressure, possibly related to fluid retention. Currently on losartan  and diltiazem . - Increase furosemide  to 40 mg daily. - Add spironolactone  25 mg daily. - Consider increasing losartan  if blood pressure remains elevated.        Relevant Medications    furosemide  (LASIX ) 40 MG tablet    spironolactone  (ALDACTONE ) 25 MG tablet    Other Relevant Orders    EKG 12-Lead (Completed)    Basic metabolic panel with GFR    Brain natriuretic peptide    Basic metabolic panel with GFR    Hyperlipidemia associated with type 2 diabetes mellitus (HCC) (Chronic)    Hyperlipidemia: Is on 80 mg rosuvastatin.  Target LDL is less than 70. Has not had lipids checked that I can see in over a year. - Asked that she gets lipid checked at PCPs office and send the results to us .  With moderate CAD, recommendation  is LDL less than 70.  May need more aggressive management but no recent labs available.   Type 2 diabetes managed with Mounjaro  and insulin . Reports difficulty with weight loss. - Continue Mounjaro  and insulin  regimen.        Relevant Medications    furosemide  (LASIX ) 40 MG tablet    spironolactone  (ALDACTONE ) 25 MG tablet    Moderate mitral stenosis by prior echocardiography (Chronic)    Moderate mitral stenosis with mean gradient of 11 mmHg and MVA of 1.8 cm. Seen by Dr. Laurence from [facility].  Was not having symptoms at the time.  Echo and coronary CT  angiogram results reviewed that were ordered at [facility].  Mild stable disease noted on Coronary CTA in the mitral valve seems to have moderate stenosis. - Re-evaluate with echocardiogram in February. -Likely leading to her worsening diastolic dysfunction. - Increase furosemide  to 40 mg daily. - Add spironolactone  25 mg daily.        Relevant Medications    furosemide  (LASIX ) 40 MG tablet    spironolactone  (ALDACTONE ) 25 MG tablet    PAF (paroxysmal atrial fibrillation) (HCC); CHA2DS2-VASc score 7 (Chronic)    Rate well-controlled.  She is not aware of being in A-fib or not.  She is on diltiazem  XT 360 mg daily for rate control. Remains on Eliquis  5 mg twice daily with no melena, hematochezia hematuria epistaxis.        Her GI care is being taken over by Dr Editha after Dr Mcarthur retirement.  Last visit in May: Recurrent anemia acute on chronic, s/p GI bleed, known AVMs, on chronic Eliquis  for atrial fibrillation.  Had video capsule endoscopy and small bowel endoscopy x 2 in October 2024.  New set of recurrent bleeding since then.  Hemoglobin staying around 8 g range.  She does also have thalassemia. -Continue Pantoprazole  40mg  po daily.  Prescription sent to pharmacy. -Hemoglobin to be monitored per PCP periodically. -If any recurrent bleeding then will likely need to be seen at a quatern[facility] center for double-balloon enteroscopy.   Last visit with pulmonology in January  -stable ILD -CPAP  She has endocrinology care for her diabetes- Dr Sam- insulin  pump    Need to update A1c Recommend covid and flu this fall RSV needed Foot exam is due Shingrix - did she ever get 2nd dose?  Mammo can be updated Dexa can be updated  Colonoscopy  S/p hysterectomy     @[email or web address] @  Patient Active Problem List   Diagnosis Date Noted   AVM (arteriovenous malformation) of small bowel, acquired 08/13/2023   AVM (arteriovenous malformation) of small bowel, acquired with  hemorrhage 07/18/2023   COPD (chronic obstructive pulmonary disease) (HCC) 07/16/2023   Obesity, Class III, BMI 40-49.9 (morbid obesity) 07/16/2023   GI bleeding 07/15/2023   Dyslipidemia 07/15/2023   Type 2 diabetes mellitus without complications (HCC) 07/15/2023   ABLA (acute blood loss anemia) 12/15/2022   Acute upper GI bleed 09/04/2022   IDA (iron deficiency anemia) 02/09/2022   Acute on chronic respiratory failure with hypoxia (HCC) 11/14/2021   AKI (acute kidney injury) (HCC)    Syncope 11/13/2021   Acute gastric ulcer without hemorrhage or perforation    Ulcer of esophagus without bleeding    PAF (paroxysmal atrial fibrillation) (HCC); CHA2DS2-VASc score 7 07/05/2021   Anemia    Acute gastric ulcer with hemorrhage    Occult GI bleeding    Melena    Preop respiratory exam    ILD (interstitial lung disease) (HCC)  SOB (shortness of breath) 03/24/2021   FUO (fever of unknown origin) 03/24/2021   Hypokalemia 03/24/2021   Generalized weakness 03/24/2021   Coronary artery spasm (HCC) 08/09/2020   Rheumatoid arthritis (HCC) 11/09/2019   Pain in toes of both feet 05/26/2019   Type 2 diabetes mellitus with hyperlipidemia (HCC) 05/26/2019   Mobility impaired 04/01/2019   Class 3 obesity 12/03/2017   DOE (dyspnea on exertion) 12/02/2017   Moderate mitral stenosis by prior echocardiography 08/23/2017   Chronic diastolic CHF (congestive heart failure) (HCC) 08/12/2017   Chronic respiratory failure with hypoxia (HCC) 08/12/2017   OSA on CPAP 08/12/2017   COPD with acute bronchitis (HCC) 08/12/2017   Near syncope 07/03/2017   Anxiety 07/03/2017   Coronary artery disease, non-occlusive 12/08/2016   PAT (paroxysmal atrial tachycardia) (HCC)    H/O total knee replacement 08/19/2015   Vision, loss, sudden 03/07/2015   S/P TKR (total knee replacement) using cement 02/18/2015   Thalassemia 09/02/2013   Hyperlipidemia associated with type 2 diabetes mellitus (HCC) 08/24/2012    Essential hypertension 07/22/2012    Past Medical History:  Diagnosis Date   Allergy 2018   Anxiety    CHF (congestive heart failure) (HCC)    Clotting disorder (HCC)    blood clot in eye 2016   COPD (chronic obstructive pulmonary disease) (HCC)    Coronary artery disease, non-occlusive    a. 11/2016 NSTEMI/Cath: LM nl, LAD 40p, 50md, D1/2 small, LCX 40ost, OM2/3 nl/small, RCA 35 ost/mid, RPDA/RPL small/nl.   Coronary vasospasm (HCC)    Depression    Diastolic dysfunction    a. 11/2016 Echo: EF 55-60%, gr1 DD, sev calcified MV annulus, mildly dil LA.   Eye hemorrhage 01/2015   right; resolved (07/03/2017)   Gastric ulcer    GERD (gastroesophageal reflux disease)    Headache    monthly (07/03/2017)   Heart murmur    History of blood transfusion    when I had an ectopic pregnancy   History of stomach ulcers    Hyperlipidemia    Hypertension    Microcytic anemia    Moderate mitral stenosis by prior echocardiogram 03/2021   TTE: Mod MS (mean gradient 9 mmHg). -- TEE Moderate-Severe MS (mean gradient 14 mmHg) with fixed Post MV Leaflet & Severe MAC w/ large circumscribed calcified mass on the Post MV Leaflet. (Mass previously described in 2018) - CMRI identifies calcified mass & not Tumor.; R&LHC - LVEDP & PCWP both 23 mmHg indicating minimal resting gradient   Myocardial infarction (HCC) 2023   OSA on CPAP    setting is unknown   Oxygen  deficiency    PAF (paroxysmal atrial fibrillation) (HCC) 03/2021   Event Monitor Aug-Sept 2022: Predominantly sinus rhythm.  1% A. fib burden.  30 brief episodes of PAT.  2 short bursts of NSVT.   PAT (paroxysmal atrial tachycardia) (HCC)    Event monitor from August 2022 showed 30 brief bursts longest 14 seconds.   Pneumonia    several times (07/03/2017)   PVC's (premature ventricular contractions)    Rheumatoid arthritis (HCC)    Sickle cell anemia (HCC)    Sleep apnea    Stroke (HCC)    Syncope 07/03/2017 X 2   called seizures but no  medications   Thalassemia    my cells are sickle cell shaped but I don't have sickle cell anemia (07/03/2017)   Type II diabetes mellitus (HCC)     Past Surgical History:  Procedure Laterality Date   48-Hour Monitor  03/18/2017   Sinus rhythm with sinus tachycardia (rate 58-134 BPM)multiple PVCs noted with couplets and bigeminy. One triplet. 7 runs of PAT ranging from 100-130 bpm. Longest was 33 beats.   BALLOON ENTEROSCOPY N/A 08/13/2023   Procedure: BALLOON ENTEROSCOPY;  Surgeon: San Sandor GAILS, DO;  Location: [facility];  Service: Gastroenterology;  Laterality: N/A;   BIOPSY  04/03/2021   Procedure: BIOPSY;  Surgeon: Leigh Elspeth SQUIBB, MD;  Location: [facility];  Service: Gastroenterology;;   BIOPSY  12/17/2022   Procedure: BIOPSY;  Surgeon: Abran Norleen SAILOR, MD;  Location: [facility];  Service: Gastroenterology;;   BREAST BIOPSY Left    BRONCHIAL WASHINGS  03/28/2021   Procedure: BRONCHIAL WASHINGS;  Surgeon: Gretta Leita SQUIBB, DO;  Location: [facility];  Service: Endoscopy;;   CARDIAC CATHETERIZATION N/A 11/19/2016   Procedure: Left Heart Cath and Coronary Angiography;  Surgeon: Victory LELON Sharps, MD;  Location: [facility]VASIVE CV LAB;  Service: Cardiovascular.    LM nl, LAD 40p, 50md, D1/2 small, LCX 40ost, OM2/3 nl/small, RCA 35 ost/mid, RPDA/RPL small/nl.   CARDIAC MRI  03/30/2021   Normal LVEF ~53%. 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 No Scar or Late Gadolinium Enhancement on LV Myocardium.   CARPAL TUNNEL RELEASE Right 11/01/2014   COLONOSCOPY     DILATION AND CURETTAGE OF UTERUS     ECTOPIC PREGNANCY SURGERY  X 2   ENTEROSCOPY N/A 09/05/2022   Procedure: ENTEROSCOPY;  Surgeon: Wilhelmenia Aloha Raddle., MD;  Location: [facility];  Service: Gastroenterology;  Laterality: N/A;   ENTEROSCOPY N/A 07/17/2023   Procedure: ENTEROSCOPY;  Surgeon: Abran Norleen SAILOR, MD;  Location: [facility];  Service: Gastroenterology;  Laterality: N/A;    ESOPHAGOGASTRODUODENOSCOPY N/A 12/17/2022   Procedure: ESOPHAGOGASTRODUODENOSCOPY (EGD);  Surgeon: Abran Norleen SAILOR, MD;  Location: [facility];  Service: Gastroenterology;  Laterality: N/A;   ESOPHAGOGASTRODUODENOSCOPY (EGD) WITH PROPOFOL  N/A 04/03/2021   Procedure: ESOPHAGOGASTRODUODENOSCOPY (EGD) WITH PROPOFOL ;  Surgeon: Leigh Elspeth SQUIBB, MD;  Location: [facility];  Service: Gastroenterology;  Laterality: N/A;   ESOPHAGOGASTRODUODENOSCOPY (EGD) WITH PROPOFOL  N/A 08/15/2021   Procedure: ESOPHAGOGASTRODUODENOSCOPY (EGD) WITH PROPOFOL ;  Surgeon: Aneita Gwendlyn DASEN, MD;  Location: [facility];  Service: Endoscopy;  Laterality: N/A;   GIVENS CAPSULE STUDY N/A 07/17/2023   Procedure: GIVENS CAPSULE STUDY;  Surgeon: Abran Norleen SAILOR, MD;  Location: [facility];  Service: Gastroenterology;  Laterality: N/A;   HOT HEMOSTASIS N/A 09/05/2022   Procedure: HOT HEMOSTASIS (ARGON PLASMA COAGULATION/BICAP);  Surgeon: Wilhelmenia Aloha Raddle., MD;  Location: [facility];  Service: Gastroenterology;  Laterality: N/A;   HOT HEMOSTASIS N/A 08/13/2023   Procedure: HOT HEMOSTASIS (ARGON PLASMA COAGULATION/BICAP);  Surgeon: San Sandor GAILS, DO;  Location: [facility];  Service: Gastroenterology;  Laterality: N/A;   JOINT REPLACEMENT     KNEE ARTHROSCOPY Left 11/2014   RIGHT/LEFT HEART CATH AND CORONARY ANGIOGRAPHY N/A 08/08/2021   Procedure: RIGHT/LEFT HEART CATH AND CORONARY ANGIOGRAPHY;  Surgeon: Anner Alm LELON, MD;  Location: [facility] INVASIVE CV LAB;  Service: Cardiovascular;Ost LCx 40%. Ost-Prox RCA 35%. Prox-Mid LAD 25%-<25% D2. Mod-Severely Elevated LVEDP. Mild PA HTN -> PA mean 31 mmHg with a PCWP and LVEDP of 23 mmHg   SUBMUCOSAL TATTOO INJECTION  09/05/2022   Procedure: SUBMUCOSAL TATTOO INJECTION;  Surgeon: Wilhelmenia Aloha Raddle., MD;  Location: [facility];  Service: Gastroenterology;;   SUBMUCOSAL TATTOO INJECTION  07/17/2023   Procedure: SUBMUCOSAL TATTOO INJECTION;  Surgeon: Abran Norleen SAILOR, MD;   Location: [facility];  Service: Gastroenterology;;  TEE WITHOUT CARDIOVERSION N/A 03/28/2021   Procedure: TRANSESOPHAGEAL ECHOCARDIOGRAM (TEE);  Surgeon: Alveta Aleene PARAS, MD;  Location: [facility] ENDOSCOPY;; LVEF 55-60%. Normal LV Fxn & no RWMA. No LAA thrombus. Gr II Ao Plaque. Large circumscribed calcific mass (1.8 x 1.4 cm) anterior to posterior annulus.  Fixed posterior leaflet with Mod-Severe MS (Mean MVG ~14 mmHg, Peak 20 mmhg).  Mild MR   TONSILLECTOMY     TOTAL KNEE ARTHROPLASTY Right 02/18/2015   Procedure: RIGHT TOTAL KNEE ARTHROPLASTY;  Surgeon: Marcey Her, MD;  Location: [facility] OR;  Service: Orthopedics;  Laterality: Right;   TOTAL KNEE ARTHROPLASTY Left 08/19/2015   Procedure: LEFT TOTAL KNEE ARTHROPLASTY;  Surgeon: Marcey Her, MD;  Location: [facility] OR;  Service: Orthopedics;  Laterality: Left;   TRANSTHORACIC ECHOCARDIOGRAM  11/2016; 07/2017:   a) normal LV size, thickness and function. EF 55-60%. GR 1 DD.No RWMA severe mitral annular calcification, but no mitral stenosis. Mild LA dilation;; b) normal LV size and function.  EF 65-75%.  Unable to assess diastolic function.  Aortic sclerosis with no stenosis.  Moderate mitral stenosis? (Gradient 9 mmHg) -?  Mild-mod left atrial enlargement    TRANSTHORACIC ECHOCARDIOGRAM  01/2019   EF 65 to 70%.  Normal function.  Elevated LVEDP-GR 1 DD.  Normal RV size and function.  Large calcific mass on posterior mitral leaflet mild to moderate mitral stenosis.   TRANSTHORACIC ECHOCARDIOGRAM  03/24/2021   EF 60 to 65%.  LV function with normal wall motion.  Moderate basal septal LVH.  GRII DD & Mild LA dilation.-> elevated LVEDP.  Normal RV function.  Mildly elevated PAP.  Ill-defined density measuring 3.23 x 2.1 cm region of the posterior MV leaflet along with dense MAC.  Moderate MS (mean MVG of 9 mmHg.) Normal AoV & RAP. --> Recommend TEE 2/2 concern for MV SBE.   TRANSTHORACIC ECHOCARDIOGRAM  04/26/2022   EF 60 to 65%.  No RWMA.  Moderate LVH with GR 1  DD.  Normal RV size and function.  Mild LA dilation.  Large calcific mass of posterior MV leaflet.  Mean PG 13 mm 3.  Trivial MR.  Mild to moderate MS.  Severe MAC.  Normal AOV.  Normal RAP.   VAGINAL HYSTERECTOMY     VIDEO BRONCHOSCOPY N/A 03/28/2021   Procedure: VIDEO BRONCHOSCOPY WITHOUT FLUORO;  Surgeon: Gretta Leita SQUIBB, DO;  Location: [facility] ENDOSCOPY;  Service: Endoscopy;  Laterality: N/A;    Social History   Tobacco Use   Smoking status: Former    Current packs/day: 0.00    Average packs/day: 0.3 packs/day for 44.0 years (11.0 ttl pk-yrs)    Types: Cigarettes    Start date: 02/17/1971    Quit date: 02/17/2015    Years since quitting: 9.3   Smokeless tobacco: Never  Vaping Use   Vaping status: Never Used  Substance Use Topics   Alcohol use: Not Currently    Comment: 07/03/2017 might have a few drinks/year   Drug use: No    Family History  Problem Relation Age of Onset   Cancer Mother    Diabetes Mother    Hypertension Mother    Hyperlipidemia Mother    Cancer Father    Hyperlipidemia Father    Mental illness Sister    Diabetes Sister    Diabetes Maternal Grandmother    Diabetes Maternal Grandfather    Colon cancer Neg Hx    Esophageal cancer Neg Hx    Stomach cancer Neg Hx    Rectal cancer Neg Hx  Tremor Neg Hx    Parkinson's disease Neg Hx     Allergies  Allergen Reactions   Methotrexate Other (See Comments)    Pneumonitis   Penicillins Hives    Childhood allergy Has patient had a PCN reaction causing immediate rash, facial/tongue/throat swelling, SOB or lightheadedness with hypotension: Yes Has patient had a PCN reaction causing severe rash involving mucus membranes or skin necrosis: No Has patient had a PCN reaction that required hospitalization No Has patient had a PCN reaction occurring within the last 10 years: No If all of the above answers are NO, then may proceed with Cephalosporin use.   Farxiga  [Dapagliflozin ] Other (See Comments)    Dizzy and  lethargic    Metformin  And Related Diarrhea and Other (See Comments)    Also, bleeding    Medication list has been reviewed and updated.  Current Outpatient Medications on File Prior to Visit  Medication Sig Dispense Refill   acetaminophen  (TYLENOL ) 500 MG tablet Take 1,000 mg by mouth every 6 (six) hours as needed (for pain).     albuterol  (PROVENTIL ) (2.5 MG/3ML) 0.083% nebulizer solution INHALE 3 ML BY NEBULIZATION EVERY 6 HOURS AS NEEDED FOR WHEEZING OR SHORTNESS OF BREATH 150 mL 7   albuterol  (VENTOLIN  HFA) 108 (90 Base) MCG/ACT inhaler TAKE 2 PUFFS BY MOUTH EVERY 6 HOURS AS NEEDED FOR WHEEZE OR SHORTNESS OF BREATH 8.5 each 2   ALPRAZolam  (XANAX ) 0.25 MG tablet TAKE 1-2 TABLETS BY MOUTH 2 TIMES DAILY AS NEEDED FOR ANXIETY. CAN ALSO TAKE A DOSE AS NEEDED FOR INSOMNIA 30 tablet 1   apixaban  (ELIQUIS ) 5 MG TABS tablet Take 1 tablet (5 mg total) by mouth 2 (two) times daily. 180 tablet 3   atorvastatin  (LIPITOR ) 80 MG tablet Take 1 tablet (80 mg total) by mouth every evening. 90 tablet 3   cetirizine (ZYRTEC) 10 MG tablet Take 10 mg by mouth daily as needed for allergies.     Continuous Glucose Sensor (DEXCOM G7 SENSOR) MISC 1 Device by Does not apply route as directed. 9 each 3   cyclobenzaprine  (FLEXERIL ) 5 MG tablet Take 1 tablet (5 mg total) by mouth 3 (three) times daily as needed for muscle spasms. 15 tablet 0   diltiazem  (CARDIZEM  CD) 360 MG 24 hr capsule TAKE 1 CAPSULE BY MOUTH EVERY DAY 90 capsule 1   folic acid  (FOLVITE ) 1 MG tablet Take 2 tablets (2 mg total) by mouth daily. (Patient taking differently: Take 1 mg by mouth daily.) 180 tablet 3   furosemide  (LASIX ) 40 MG tablet Take 40 mg by mouth in the morning and an additional 40 mg for an overnight weight gain of 3 pounds or 5 pounds in a week 135 tablet 3   glucose blood (CONTOUR NEXT TEST) test strip 3x daily 300 each 11   hydroxychloroquine  (PLAQUENIL ) 200 MG tablet Take 200 mg by mouth 2 (two) times daily.     Insulin   Disposable Pump (OMNIPOD 5 G7 PODS, GEN 5,) MISC Use 1 pod every other day. 45 each 3   insulin  lispro (HUMALOG ) 100 UNIT/ML injection Max Daily 70 units per pump 70 mL 3   Insulin  Pen Needle (B-D UF III MINI PEN NEEDLES) 31G X 5 MM MISC USE DAILY WITH VICTOZA  AND LANTUS . 400 each 1   Insulin  Pen Needle 31G X 5 MM MISC 1 Device by Does not apply route 3 (three) times daily. 150 each 11   isosorbide  mononitrate (IMDUR ) 30 MG 24 hr tablet Take 1 tablet (  30 mg total) by mouth daily. 90 tablet 3   Lancets 30G MISC 1 Device by Does not apply route 3 (three) times daily. 300 each 9   leflunomide  (ARAVA ) 20 MG tablet Take 20 mg by mouth daily.     losartan  (COZAAR ) 100 MG tablet Take 0.5 tablets (50 mg total) by mouth daily. 45 tablet 3   Melatonin 5 MG CHEW Chew 10 mg by mouth at bedtime.     Multiple Vitamin (MULTIVITAMIN WITH MINERALS) TABS tablet Take 1 tablet by mouth daily with breakfast.     Oxymetazoline  HCl (VICKS SINEX NA) Place 1 spray into both nostrils every 12 (twelve) hours as needed (for congestion- MAX OF THREE CONSECUTIVE DAYS).     pantoprazole  (PROTONIX ) 40 MG tablet Take 1 tablet (40 mg total) by mouth daily before breakfast. 90 tablet 3   SAFETY-LOK TB SYRINGE 27GX.5 27G X 1/2 1 ML MISC      sodium chloride  (OCEAN) 0.65 % SOLN nasal spray Place 1 spray into both nostrils as needed. (Patient taking differently: Place 1 spray into both nostrils as needed for congestion.) 30 mL 5   spironolactone  (ALDACTONE ) 25 MG tablet Take 1 tablet (25 mg total) by mouth daily. 90 tablet 3   tirzepatide  (MOUNJARO ) 12.5 MG/0.5ML Pen Inject 12.5 mg into the skin once a week. 6 mL 3   traZODone  (DESYREL ) 50 MG tablet Take 0.5-1 tablets (25-50 mg total) by mouth at bedtime as needed for sleep. Needs appt 30 tablet 0   No current facility-administered medications on file prior to visit.    Review of Systems:  As per HPI- otherwise negative.   Physical Examination: There were no vitals filed for  this visit. There were no vitals filed for this visit. There is no height or weight on file to calculate BMI. Ideal Body Weight:    GEN: no acute distress. HEENT: Atraumatic, Normocephalic.  Ears and Nose: No external deformity. CV: RRR, No M/G/R. No JVD. No thrill. No extra heart sounds. PULM: CTA B, no wheezes, crackles, rhonchi. No retractions. No resp. distress. No accessory muscle use. ABD: S, NT, ND, +BS. No rebound. No HSM. EXTR: No c/c/e PSYCH: Normally interactive. Conversant.  Foot exam   Assessment and Plan: No diagnosis found.  Assessment & Plan   Signed Harlene Schroeder, MD

## 2024-06-14 NOTE — Patient Instructions (Incomplete)
 Good to see you today!  Recommend flu shot, covid booster this fall Recommend RSV if not done yet Recommend 2nd dose of Shingrix  if not done yet   I am sorry you are having such a hard time!  Please let me know how I can help you  Ok to use the alprazolam  as needed for anxiety or insomnia  We can add some pain medication if  you want to!  Please keep me posted!

## 2024-06-17 ENCOUNTER — Ambulatory Visit: Admitting: Family Medicine

## 2024-06-17 ENCOUNTER — Encounter: Payer: Self-pay | Admitting: Family Medicine

## 2024-06-17 VITALS — BP 118/70 | HR 71 | Ht 63.0 in | Wt 251.0 lb

## 2024-06-17 DIAGNOSIS — Z7409 Other reduced mobility: Secondary | ICD-10-CM

## 2024-06-17 DIAGNOSIS — M069 Rheumatoid arthritis, unspecified: Secondary | ICD-10-CM

## 2024-06-17 DIAGNOSIS — F410 Panic disorder [episodic paroxysmal anxiety] without agoraphobia: Secondary | ICD-10-CM

## 2024-06-17 DIAGNOSIS — Z23 Encounter for immunization: Secondary | ICD-10-CM

## 2024-06-17 DIAGNOSIS — J9611 Chronic respiratory failure with hypoxia: Secondary | ICD-10-CM

## 2024-06-17 DIAGNOSIS — E785 Hyperlipidemia, unspecified: Secondary | ICD-10-CM | POA: Diagnosis not present

## 2024-06-17 DIAGNOSIS — J449 Chronic obstructive pulmonary disease, unspecified: Secondary | ICD-10-CM

## 2024-06-17 DIAGNOSIS — I480 Paroxysmal atrial fibrillation: Secondary | ICD-10-CM

## 2024-06-17 DIAGNOSIS — G4700 Insomnia, unspecified: Secondary | ICD-10-CM

## 2024-06-17 DIAGNOSIS — G8929 Other chronic pain: Secondary | ICD-10-CM | POA: Diagnosis not present

## 2024-06-17 DIAGNOSIS — Z1231 Encounter for screening mammogram for malignant neoplasm of breast: Secondary | ICD-10-CM

## 2024-06-17 DIAGNOSIS — E1169 Type 2 diabetes mellitus with other specified complication: Secondary | ICD-10-CM | POA: Diagnosis not present

## 2024-06-17 DIAGNOSIS — Z5181 Encounter for therapeutic drug level monitoring: Secondary | ICD-10-CM | POA: Diagnosis not present

## 2024-06-17 DIAGNOSIS — M549 Dorsalgia, unspecified: Secondary | ICD-10-CM

## 2024-06-17 DIAGNOSIS — I10 Essential (primary) hypertension: Secondary | ICD-10-CM

## 2024-06-17 DIAGNOSIS — E2839 Other primary ovarian failure: Secondary | ICD-10-CM

## 2024-06-17 MED ORDER — ALPRAZOLAM 0.25 MG PO TABS: 0.2500 mg | ORAL_TABLET | Freq: Two times a day (BID) | ORAL | 1 refills | Status: DC | PRN

## 2024-06-17 MED ORDER — TRAZODONE HCL 50 MG PO TABS: 25.0000 mg | ORAL_TABLET | Freq: Every evening | ORAL | 3 refills | Status: AC | PRN

## 2024-06-18 ENCOUNTER — Encounter: Payer: Self-pay | Admitting: Family Medicine

## 2024-06-18 LAB — LIPID PANEL
Cholesterol: 118 mg/dL (ref 0–200)
HDL: 58.1 mg/dL (ref >39.00)
LDL Cholesterol: 46 mg/dL (ref 0–99)
NonHDL: 60
Total CHOL/HDL Ratio: 2
Triglycerides: 69 mg/dL (ref 0.0–149.0)
VLDL: 13.8 mg/dL (ref 0.0–40.0)

## 2024-06-18 LAB — HEPATIC FUNCTION PANEL
ALT: 13 U/L (ref 0–35)
AST: 16 U/L (ref 0–37)
Albumin: 4.2 g/dL (ref 3.5–5.2)
Alkaline Phosphatase: 76 U/L (ref 39–117)
Bilirubin, Direct: 0.1 mg/dL (ref 0.0–0.3)
Total Bilirubin: 0.6 mg/dL (ref 0.2–1.2)
Total Protein: 7.9 g/dL (ref 6.0–8.3)

## 2024-06-18 LAB — MICROALBUMIN / CREATININE URINE RATIO
Creatinine,U: 47 mg/dL
Microalb Creat Ratio: UNDETERMINED mg/g (ref 0.0–30.0)
Microalb, Ur: <0.7 mg/dL

## 2024-06-18 LAB — HEMOGLOBIN A1C: Hgb A1c MFr Bld: 7.1 % — ABNORMAL HIGH (ref 4.6–6.5)

## 2024-06-18 NOTE — Addendum Note (Signed)
 Addended by: WATT RAISIN C on: 06/18/2024 11:26 AM   Modules accepted: Orders

## 2024-06-22 ENCOUNTER — Ambulatory Visit
Admission: RE | Admit: 2024-06-22 | Discharge: 2024-06-22 | Disposition: A | Discharge status: Home / Self Care | Source: Ambulatory Visit | Attending: Internal Medicine | Admitting: Internal Medicine

## 2024-06-22 DIAGNOSIS — J849 Interstitial pulmonary disease, unspecified: Secondary | ICD-10-CM

## 2024-06-22 DIAGNOSIS — G4733 Obstructive sleep apnea (adult) (pediatric): Secondary | ICD-10-CM

## 2024-06-22 DIAGNOSIS — J984 Other disorders of lung: Secondary | ICD-10-CM | POA: Diagnosis not present

## 2024-06-22 DIAGNOSIS — I050 Rheumatic mitral stenosis: Secondary | ICD-10-CM

## 2024-06-22 DIAGNOSIS — J679 Hypersensitivity pneumonitis due to unspecified organic dust: Secondary | ICD-10-CM

## 2024-06-23 ENCOUNTER — Ambulatory Visit (INDEPENDENT_AMBULATORY_CARE_PROVIDER_SITE_OTHER)

## 2024-06-23 VITALS — Ht 63.0 in | Wt 251.0 lb

## 2024-06-23 DIAGNOSIS — Z0000 Encounter for general adult medical examination without abnormal findings: Secondary | ICD-10-CM | POA: Diagnosis not present

## 2024-06-23 NOTE — Patient Instructions (Addendum)
 Ms. Boruff,  Thank you for taking the time for your Medicare Wellness Visit. I appreciate your continued commitment to your health goals. Please review the care plan we discussed, and feel free to reach out if I can assist you further.  Medicare recommends these wellness visits once per year to help you and your care team stay ahead of potential health issues. These visits are designed to focus on prevention, allowing your provider to concentrate on managing your acute and chronic conditions during your regular appointments.  Please note that Annual Wellness Visits do not include a physical exam. Some assessments may be limited, especially if the visit was conducted virtually. If needed, we may recommend a separate in-person follow-up with your provider.  Ongoing Care Seeing your primary care provider every 3 to 6 months helps us  monitor your health and provide consistent, personalized care.   Referrals If a referral was made during today's visit and you haven't received any updates within two weeks, please contact the referred provider directly to check on the status.  Recommended Screenings:  Health Maintenance  Topic Date Due   Zoster (Shingles) Vaccine (2 of 2) 04/17/2021   Complete foot exam   04/21/2024   COVID-19 Vaccine (7 - Moderna risk 2024-25 season) 06/15/2024   Hemoglobin A1C  12/15/2024   Eye exam for diabetics  04/27/2025   Yearly kidney function blood test for diabetes  06/08/2025   Yearly kidney health urinalysis for diabetes  06/17/2025   Medicare Annual Wellness Visit  06/23/2025   Mammogram  07/04/2025   Colon Cancer Screening  08/23/2026   DTaP/Tdap/Td vaccine (3 - Td or Tdap) 10/17/2026   Pneumococcal Vaccine for age over 37  Completed   Flu Shot  Completed   DEXA scan (bone density measurement)  Completed   Hepatitis C Screening  Completed   HPV Vaccine  Aged Out   Meningitis B Vaccine  Aged Out       06/23/2024    1:30 PM  Advanced Directives  Does  Patient Have a Medical Advance Directive? No  Would patient like information on creating a medical advance directive? No - Patient declined   Advance Care Planning is important because it: Ensures you receive medical care that aligns with your values, goals, and preferences. Provides guidance to your family and loved ones, reducing the emotional burden of decision-making during critical moments.  Vision: Annual vision screenings are recommended for early detection of glaucoma, cataracts, and diabetic retinopathy. These exams can also reveal signs of chronic conditions such as diabetes and high blood pressure.  Dental: Annual dental screenings help detect early signs of oral cancer, gum disease, and other conditions linked to overall health, including heart disease and diabetes.  Please see the attached documents for additional preventive care recommendations.

## 2024-06-23 NOTE — Progress Notes (Signed)
 Subjective:   Lynn [name] is a 68 y.o. who presents for a Medicare Wellness preventive visit.  As a reminder, Annual Wellness Visits don't include a physical exam, and some assessments may be limited, especially if this visit is performed virtually. We may recommend an in-person follow-up visit with your provider if needed.  Visit Complete: Virtual I connected with  Lynn [name] on 06/23/24 by a audio enabled telemedicine application and verified that I am speaking with the correct person using two identifiers.  Patient Location: Home  Provider Location: Home Office  I discussed the limitations of evaluation and management by telemedicine. The patient expressed understanding and agreed to proceed.  Vital Signs: Because this visit was a virtual/telehealth visit, some criteria may be missing or patient reported. Any vitals not documented were not able to be obtained and vitals that have been documented are patient reported.    Persons Participating in Visit: Patient.  AWV Questionnaire: Yes: Patient Medicare AWV questionnaire was completed by the patient on 06/18/24; I have confirmed that all information answered by patient is correct and no changes since this date.  Cardiac Risk Factors include: advanced age (>83men, >87 women);diabetes mellitus;hypertension;sedentary lifestyle     Objective:    Today's Vitals   06/23/24 1312 06/23/24 1313  Weight: 251 lb (113.9 kg)   Height: 5' 3 (1.6 m)   PainSc:  0-No pain   Body mass index is 44.46 kg/m.     06/23/2024    1:30 PM 08/13/2023    6:50 AM 07/15/2023    8:58 PM 07/15/2023    5:17 PM 06/20/2023    1:53 PM 12/26/2022    8:11 PM 12/15/2022   12:36 PM  Advanced Directives  Does Patient Have a Medical Advance Directive? No No No No No No No  Would patient like information on creating a medical advance directive? No - Patient declined No - Patient declined No - Patient declined No - Patient declined No - Patient  declined No - Patient declined No - Patient declined    Current Medications (verified) Outpatient Encounter Medications as of 06/23/2024  Medication Sig   acetaminophen  (TYLENOL ) 500 MG tablet Take 1,000 mg by mouth every 6 (six) hours as needed (for pain).   albuterol  (PROVENTIL ) (2.5 MG/3ML) 0.083% nebulizer solution INHALE 3 ML BY NEBULIZATION EVERY 6 HOURS AS NEEDED FOR WHEEZING OR SHORTNESS OF BREATH   albuterol  (VENTOLIN  HFA) 108 (90 Base) MCG/ACT inhaler TAKE 2 PUFFS BY MOUTH EVERY 6 HOURS AS NEEDED FOR WHEEZE OR SHORTNESS OF BREATH   ALPRAZolam  (XANAX ) 0.25 MG tablet Take 1 tablet (0.25 mg total) by mouth 2 (two) times daily as needed for anxiety. TAKE 1-2 TABLETS BY MOUTH 2 TIMES DAILY AS NEEDED FOR ANXIETY OR INSOMNIA   apixaban  (ELIQUIS ) 5 MG TABS tablet Take 1 tablet (5 mg total) by mouth 2 (two) times daily.   atorvastatin  (LIPITOR ) 80 MG tablet Take 1 tablet (80 mg total) by mouth every evening.   cetirizine (ZYRTEC) 10 MG tablet Take 10 mg by mouth daily as needed for allergies.   Continuous Glucose Sensor (DEXCOM G7 SENSOR) MISC 1 Device by Does not apply route as directed.   cyclobenzaprine  (FLEXERIL ) 5 MG tablet Take 1 tablet (5 mg total) by mouth 3 (three) times daily as needed for muscle spasms.   diltiazem  (CARDIZEM  CD) 360 MG 24 hr capsule TAKE 1 CAPSULE BY MOUTH EVERY DAY   folic acid  (FOLVITE ) 1 MG tablet Take 2 tablets (2 mg  total) by mouth daily. (Patient taking differently: Take 1 mg by mouth daily.)   furosemide  (LASIX ) 40 MG tablet Take 40 mg by mouth in the morning and an additional 40 mg for an overnight weight gain of 3 pounds or 5 pounds in a week   glucose blood (CONTOUR NEXT TEST) test strip 3x daily   hydroxychloroquine  (PLAQUENIL ) 200 MG tablet Take 200 mg by mouth 2 (two) times daily.   Insulin  Disposable Pump (OMNIPOD 5 G7 PODS, GEN 5,) MISC Use 1 pod every other day.   insulin  lispro (HUMALOG ) 100 UNIT/ML injection Max Daily 70 units per pump   Insulin  Pen  Needle (B-D UF III MINI PEN NEEDLES) 31G X 5 MM MISC USE DAILY WITH VICTOZA  AND LANTUS .   Insulin  Pen Needle 31G X 5 MM MISC 1 Device by Does not apply route 3 (three) times daily.   isosorbide  mononitrate (IMDUR ) 30 MG 24 hr tablet Take 1 tablet (30 mg total) by mouth daily.   Lancets 30G MISC 1 Device by Does not apply route 3 (three) times daily.   leflunomide  (ARAVA ) 20 MG tablet Take 20 mg by mouth daily.   losartan  (COZAAR ) 100 MG tablet Take 0.5 tablets (50 mg total) by mouth daily.   Melatonin 5 MG CHEW Chew 10 mg by mouth at bedtime.   Multiple Vitamin (MULTIVITAMIN WITH MINERALS) TABS tablet Take 1 tablet by mouth daily with breakfast.   Oxymetazoline  HCl (VICKS SINEX NA) Place 1 spray into both nostrils every 12 (twelve) hours as needed (for congestion- MAX OF THREE CONSECUTIVE DAYS).   pantoprazole  (PROTONIX ) 40 MG tablet Take 1 tablet (40 mg total) by mouth daily before breakfast.   SAFETY-LOK TB SYRINGE 27GX.5 27G X 1/2 1 ML MISC    sodium chloride  (OCEAN) 0.65 % SOLN nasal spray Place 1 spray into both nostrils as needed. (Patient taking differently: Place 1 spray into both nostrils as needed for congestion.)   spironolactone  (ALDACTONE ) 25 MG tablet Take 1 tablet (25 mg total) by mouth daily.   tirzepatide  (MOUNJARO ) 12.5 MG/0.5ML Pen Inject 12.5 mg into the skin once a week.   traZODone  (DESYREL ) 50 MG tablet Take 0.5-1 tablets (25-50 mg total) by mouth at bedtime as needed for sleep. Needs appt   No facility-administered encounter medications on file as of 06/23/2024.    Allergies (verified) Methotrexate, Penicillins, Farxiga  [dapagliflozin ], and Metformin  and related   History: Past Medical History:  Diagnosis Date   Allergy 2018   Anxiety    CHF (congestive heart failure) (HCC)    Clotting disorder (HCC)    blood clot in eye 2016   COPD (chronic obstructive pulmonary disease) (HCC)    Coronary artery disease, non-occlusive    a. 11/2016 NSTEMI/Cath: LM nl, LAD 40p,  50md, D1/2 small, LCX 40ost, OM2/3 nl/small, RCA 35 ost/mid, RPDA/RPL small/nl.   Coronary vasospasm (HCC)    Depression    Diastolic dysfunction    a. 11/2016 Echo: EF 55-60%, gr1 DD, sev calcified MV annulus, mildly dil LA.   Eye hemorrhage 01/2015   right; resolved (07/03/2017)   Gastric ulcer    GERD (gastroesophageal reflux disease)    Headache    monthly (07/03/2017)   Heart murmur    History of blood transfusion    when I had an ectopic pregnancy   History of stomach ulcers    Hyperlipidemia    Hypertension    Microcytic anemia    Moderate mitral stenosis by prior echocardiogram 03/2021   TTE:  Mod MS (mean gradient 9 mmHg). -- TEE Moderate-Severe MS (mean gradient 14 mmHg) with fixed Post MV Leaflet & Severe MAC w/ large circumscribed calcified mass on the Post MV Leaflet. (Mass previously described in 2018) - CMRI identifies calcified mass & not Tumor.; R&LHC - LVEDP & PCWP both 23 mmHg indicating minimal resting gradient   Myocardial infarction (HCC) 2023   OSA on CPAP    setting is unknown   Oxygen  deficiency    PAF (paroxysmal atrial fibrillation) (HCC) 03/2021   Event Monitor Aug-Sept 2022: Predominantly sinus rhythm.  1% A. fib burden.  30 brief episodes of PAT.  2 short bursts of NSVT.   PAT (paroxysmal atrial tachycardia) (HCC)    Event monitor from August 2022 showed 30 brief bursts longest 14 seconds.   Pneumonia    several times (07/03/2017)   PVC's (premature ventricular contractions)    Rheumatoid arthritis (HCC)    Sickle cell anemia (HCC)    Sleep apnea    Stroke [facility])    Syncope 07/03/2017 X 2   called seizures but no medications   Thalassemia    my cells are sickle cell shaped but I don't have sickle cell anemia (07/03/2017)   Type II diabetes mellitus (HCC)    Past Surgical History:  Procedure Laterality Date   48-Hour Monitor  03/18/2017   Sinus rhythm with sinus tachycardia (rate 58-134 BPM)multiple PVCs noted with couplets and bigeminy. One  triplet. 7 runs of PAT ranging from 100-130 bpm. Longest was 33 beats.   BALLOON ENTEROSCOPY N/A 08/13/2023   Procedure: BALLOON ENTEROSCOPY;  Surgeon: San Sandor GAILS, DO;  Location: [facility];  Service: Gastroenterology;  Laterality: N/A;   BIOPSY  04/03/2021   Procedure: BIOPSY;  Surgeon: Leigh Elspeth SQUIBB, MD;  Location: [facility];  Service: Gastroenterology;;   BIOPSY  12/17/2022   Procedure: BIOPSY;  Surgeon: Abran Norleen SAILOR, MD;  Location: [facility];  Service: Gastroenterology;;   BREAST BIOPSY Left    BRONCHIAL WASHINGS  03/28/2021   Procedure: BRONCHIAL WASHINGS;  Surgeon: Gretta Leita SQUIBB, DO;  Location: [facility];  Service: Endoscopy;;   CARDIAC CATHETERIZATION N/A 11/19/2016   Procedure: Left Heart Cath and Coronary Angiography;  Surgeon: Victory LELON Sharps, MD;  Location: [facility] INVASIVE CV LAB;  Service: Cardiovascular.    LM nl, LAD 40p, 50md, D1/2 small, LCX 40ost, OM2/3 nl/small, RCA 35 ost/mid, RPDA/RPL small/nl.   CARDIAC MRI  03/30/2021   Normal LVEF ~53%. 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 No Scar or Late Gadolinium Enhancement on LV Myocardium.   CARPAL TUNNEL RELEASE Right 11/01/2014   COLONOSCOPY     DILATION AND CURETTAGE OF UTERUS     ECTOPIC PREGNANCY SURGERY  X 2   ENTEROSCOPY N/A 09/05/2022   Procedure: ENTEROSCOPY;  Surgeon: Wilhelmenia Aloha Raddle., MD;  Location: [facility];  Service: Gastroenterology;  Laterality: N/A;   ENTEROSCOPY N/A 07/17/2023   Procedure: ENTEROSCOPY;  Surgeon: Abran Norleen SAILOR, MD;  Location: [facility];  Service: Gastroenterology;  Laterality: N/A;   ESOPHAGOGASTRODUODENOSCOPY N/A 12/17/2022   Procedure: ESOPHAGOGASTRODUODENOSCOPY (EGD);  Surgeon: Abran Norleen SAILOR, MD;  Location: [facility];  Service: Gastroenterology;  Laterality: N/A;   ESOPHAGOGASTRODUODENOSCOPY (EGD) WITH PROPOFOL  N/A 04/03/2021   Procedure: ESOPHAGOGASTRODUODENOSCOPY (EGD) WITH PROPOFOL ;  Surgeon: Leigh Elspeth SQUIBB, MD;  Location: [facility];  Service: Gastroenterology;  Laterality: N/A;   ESOPHAGOGASTRODUODENOSCOPY (EGD) WITH PROPOFOL  N/A 08/15/2021   Procedure: ESOPHAGOGASTRODUODENOSCOPY (EGD) WITH PROPOFOL ;  Surgeon: Aneita Gist  T, MD;  Location: [facility];  Service: Endoscopy;  Laterality: N/A;   GIVENS CAPSULE STUDY N/A 07/17/2023   Procedure: GIVENS CAPSULE STUDY;  Surgeon: Abran Norleen SAILOR, MD;  Location: [facility];  Service: Gastroenterology;  Laterality: N/A;   HOT HEMOSTASIS N/A 09/05/2022   Procedure: HOT HEMOSTASIS (ARGON PLASMA COAGULATION/BICAP);  Surgeon: Wilhelmenia Aloha Raddle., MD;  Location: [facility];  Service: Gastroenterology;  Laterality: N/A;   HOT HEMOSTASIS N/A 08/13/2023   Procedure: HOT HEMOSTASIS (ARGON PLASMA COAGULATION/BICAP);  Surgeon: San Sandor GAILS, DO;  Location: [facility];  Service: Gastroenterology;  Laterality: N/A;   JOINT REPLACEMENT     KNEE ARTHROSCOPY Left 11/2014   RIGHT/LEFT HEART CATH AND CORONARY ANGIOGRAPHY N/A 08/08/2021   Procedure: RIGHT/LEFT HEART CATH AND CORONARY ANGIOGRAPHY;  Surgeon: [name], MD;  Location: [facility]VASIVE CV LAB;  Service: Cardiovascular;Ost LCx 40%. Ost-Prox RCA 35%. Prox-Mid LAD 25%-<25% D2. Mod-Severely Elevated LVEDP. Mild PA HTN -> PA mean 31 mmHg with a PCWP and LVEDP of 23 mmHg   SUBMUCOSAL TATTOO INJECTION  09/05/2022   Procedure: SUBMUCOSAL TATTOO INJECTION;  Surgeon: Wilhelmenia Aloha Raddle., MD;  Location: [facility];  Service: Gastroenterology;;   SUBMUCOSAL TATTOO INJECTION  07/17/2023   Procedure: SUBMUCOSAL TATTOO INJECTION;  Surgeon: Abran Norleen SAILOR, MD;  Location: [facility];  Service: Gastroenterology;;   TEE WITHOUT CARDIOVERSION N/A 03/28/2021   Procedure: TRANSESOPHAGEAL ECHOCARDIOGRAM (TEE);  Surgeon: Alveta Aleene PARAS, MD;  Location: [facility] ENDOSCOPY;; LVEF 55-60%. Normal LV Fxn & no RWMA. No LAA thrombus. Gr II Ao Plaque. Large circumscribed calcific mass (1.8 x 1.4 cm) anterior to posterior  annulus.  Fixed posterior leaflet with Mod-Severe MS (Mean MVG ~14 mmHg, Peak 20 mmhg).  Mild MR   TONSILLECTOMY     TOTAL KNEE ARTHROPLASTY Right 02/18/2015   Procedure: RIGHT TOTAL KNEE ARTHROPLASTY;  Surgeon: Marcey Her, MD;  Location: [facility] OR;  Service: Orthopedics;  Laterality: Right;   TOTAL KNEE ARTHROPLASTY Left 08/19/2015   Procedure: LEFT TOTAL KNEE ARTHROPLASTY;  Surgeon: Marcey Her, MD;  Location: [facility] OR;  Service: Orthopedics;  Laterality: Left;   TRANSTHORACIC ECHOCARDIOGRAM  11/2016; 07/2017:   a) normal LV size, thickness and function. EF 55-60%. GR 1 DD.No RWMA severe mitral annular calcification, but no mitral stenosis. Mild LA dilation;; b) normal LV size and function.  EF 65-75%.  Unable to assess diastolic function.  Aortic sclerosis with no stenosis.  Moderate mitral stenosis? (Gradient 9 mmHg) -?  Mild-mod left atrial enlargement    TRANSTHORACIC ECHOCARDIOGRAM  01/2019   EF 65 to 70%.  Normal function.  Elevated LVEDP-GR 1 DD.  Normal RV size and function.  Large calcific mass on posterior mitral leaflet mild to moderate mitral stenosis.   TRANSTHORACIC ECHOCARDIOGRAM  03/24/2021   EF 60 to 65%.  LV function with normal wall motion.  Moderate basal septal LVH.  GRII DD & Mild LA dilation.-> elevated LVEDP.  Normal RV function.  Mildly elevated PAP.  Ill-defined density measuring 3.23 x 2.1 cm region of the posterior MV leaflet along with dense MAC.  Moderate MS (mean MVG of 9 mmHg.) Normal AoV & RAP. --> Recommend TEE 2/2 concern for MV SBE.   TRANSTHORACIC ECHOCARDIOGRAM  04/26/2022   EF 60 to 65%.  No RWMA.  Moderate LVH with GR 1 DD.  Normal RV size and function.  Mild LA dilation.  Large calcific mass of posterior MV leaflet.  Mean PG 13 mm 3.  Trivial MR.  Mild to moderate MS.  Severe  MAC.  Normal AOV.  Normal RAP.   VAGINAL HYSTERECTOMY     VIDEO BRONCHOSCOPY N/A 03/28/2021   Procedure: VIDEO BRONCHOSCOPY WITHOUT FLUORO;  Surgeon: Gretta Leita SQUIBB, DO;  Location: [facility]  ENDOSCOPY;  Service: Endoscopy;  Laterality: N/A;   Family History  Problem Relation Age of Onset   Cancer Mother    Diabetes Mother    Hypertension Mother    Hyperlipidemia Mother    Cancer Father    Hyperlipidemia Father    Mental illness Sister    Diabetes Sister    Diabetes Maternal Grandmother    Diabetes Maternal Grandfather    Colon cancer Neg Hx    Esophageal cancer Neg Hx    Stomach cancer Neg Hx    Rectal cancer Neg Hx    Tremor Neg Hx    Parkinson's disease Neg Hx    Social History   Socioeconomic History   Marital status: Married    Spouse name: Not on file   Number of children: 1   Years of education: Not on file   Highest education level: Associate degree: occupational, Scientist, product/process development, or vocational program  Occupational History   Occupation: retired  Tobacco Use   Smoking status: Former    Current packs/day: 0.00    Average packs/day: 0.3 packs/day for 44.0 years (11.0 ttl pk-yrs)    Types: Cigarettes    Start date: 02/17/1971    Quit date: 02/17/2015    Years since quitting: 9.3   Smokeless tobacco: Never  Vaping Use   Vaping status: Never Used  Substance and Sexual Activity   Alcohol use: Not Currently    Comment: 07/03/2017 might have a few drinks/year   Drug use: No   Sexual activity: Never  Other Topics Concern   Not on file  Social History Narrative   Lives at home with husband   Right handed   Caffeine: 1 cup coffee in AM    Social Drivers of Health   Financial Resource Strain: Low Risk  (06/23/2024)   Overall Financial Resource Strain (CARDIA)    Difficulty of Paying Living Expenses: Not hard at all  Food Insecurity: No Food Insecurity (06/23/2024)   Hunger Vital Sign    Worried About Running Out of Food in the Last Year: Never true    Ran Out of Food in the Last Year: Never true  Transportation Needs: No Transportation Needs (06/23/2024)   PRAPARE - Administrator, Civil Service (Medical): No    Lack of Transportation  (Non-Medical): No  Physical Activity: Inactive (06/23/2024)   Exercise Vital Sign    Days of Exercise per Week: 0 days    Minutes of Exercise per Session: 0 min  Stress: No Stress Concern Present (06/23/2024)   [employer] of Occupational Health - Occupational Stress Questionnaire    Feeling of Stress: Not at all  Recent Concern: Stress - Stress Concern Present (06/10/2024)   [employer] of Occupational Health - Occupational Stress Questionnaire    Feeling of Stress: To some extent  Social Connections: Moderately Isolated (06/23/2024)   Social Connection and Isolation Panel    Frequency of Communication with Friends and Family: More than three times a week    Frequency of Social Gatherings with Friends and Family: More than three times a week    Attends Religious Services: Never    Database administrator or Organizations: No    Attends Banker Meetings: Never    Marital Status: Married  Tobacco Counseling Counseling given: Not Answered    Clinical Intake:  Pre-visit preparation completed: Yes  Pain : No/denies pain Pain Score: 0-No pain     BMI - recorded: 44.46 Nutritional Status: BMI > 30  Obese Nutritional Risks: None Diabetes: Yes CBG done?: Yes (CBG 141 per patient) CBG resulted in Enter/ Edit results?: Yes Did pt. bring in CBG monitor from home?: No  Lab Results  Component Value Date   HGBA1C 7.1 (H) 06/17/2024   HGBA1C 6.8 (A) 04/22/2023   HGBA1C 6.5 (H) 12/15/2022     How often do you need to have someone help you when you read instructions, pamphlets, or other written materials from your doctor or pharmacy?: 1 - Never  Interpreter Needed?: No  Information entered by :: Rojelio Blush LPN   Activities of Daily Living     06/23/2024    1:25 PM 06/18/2024   10:39 AM  In your present state of health, do you have any difficulty performing the following activities:  Hearing? 0 0  Vision? 0 0  Difficulty concentrating or making  decisions? 0 0  Walking or climbing stairs? 1 1  Comment Uses a Financial planner or bathing? 1 1  Comment Husband assist   Doing errands, shopping? 1 1  Comment Husband assist   Preparing Food and eating ? Y Y  Comment Husband assist   Using the Toilet? N N  In the past six months, have you accidently leaked urine? Lynn [name] Lynn [name]  Comment Wears Breifs. Followed by PCP   Do you have problems with loss of bowel control? N N  Managing your Medications? N N  Managing your Finances? N N  Housekeeping or managing your Housekeeping? Lynn [name] Lynn [name]  Comment Husband Assist     Patient Care Team: Copland, [name], MD as PCP - General (Family Medicine) [name], MD as PCP - Cardiology (Cardiology) Duke, Jon Garre, PA as Physician Assistant (Cardiology)  I have updated your Care Teams any recent Medical Services you may have received from other providers in the past year.     Assessment:   This is a routine wellness examination for Lynn [name].  Hearing/Vision screen Hearing Screening - Comments:: Denies hearing difficulties   Vision Screening - Comments:: Wears rx glasses - up to date with routine eye exams with  [facility]   Goals Addressed               This Visit's Progress     Increase physical activity (pt-stated)        Remain active and staying positive.       Depression Screen     06/23/2024    1:24 PM 06/17/2024    1:41 PM 06/20/2023    1:48 PM 09/10/2022    2:11 PM 06/14/2022    1:22 PM 06/08/2021    1:10 PM 02/20/2021    1:31 PM  PHQ 2/9 Scores  PHQ - 2 Score 0 1 1 0 1 1 0  PHQ- 9 Score 0 1         Fall Risk     06/23/2024    1:28 PM 06/18/2024   10:39 AM 06/17/2024    1:41 PM 06/19/2023   12:29 PM 06/14/2022    1:18 PM  Fall Risk   Falls in the past year? 0 0 0 0 0  Number falls in past yr: 0  0 0 0  Injury with Fall? 0 0 0 0  0  Risk for fall due to : No Fall Risks  Impaired mobility;Impaired balance/gait Impaired balance/gait No Fall Risks   Follow up Falls evaluation completed  Falls evaluation completed Falls evaluation completed Falls evaluation completed      Data saved with a previous flowsheet row definition    MEDICARE RISK AT HOME:  Medicare Risk at Home Any stairs in or around the home?: Yes If so, are there any without handrails?: No Home free of loose throw rugs in walkways, pet beds, electrical cords, etc?: Yes Adequate lighting in your home to reduce risk of falls?: Yes Life alert?: Yes Use of a cane, walker or w/c?: Yes Grab bars in the bathroom?: Yes Shower chair or bench in shower?: Yes Elevated toilet seat or a handicapped toilet?: Yes  TIMED UP AND GO:  Was the test performed?  No  Cognitive Function: 6CIT completed        06/23/2024    1:30 PM 06/20/2023    1:54 PM 06/14/2022    1:35 PM  6CIT Screen  What Year? 0 points 0 points 0 points  What month? 0 points 0 points 0 points  What time? 0 points 0 points 0 points  Count back from 20 0 points 0 points 0 points  Months in reverse 2 points 0 points 4 points  Repeat phrase 2 points 2 points 0 points  Total Score 4 points 2 points 4 points    Immunizations Immunization History  Administered Date(s) Administered   Fluad Quad(high Dose 65+) 07/19/2021   Fluad Trivalent(High Dose 65+) 07/17/2023   INFLUENZA, HIGH DOSE SEASONAL PF 06/10/2019, 06/17/2024   Influenza Split 07/22/2012   Influenza,inj,Quad PF,6+ Mos 10/09/2014, 07/13/2015, 11/17/2016, 07/04/2017, 07/10/2018   Influenza-Unspecified 06/20/2020, 07/14/2022   Moderna Covid-19 Fall Seasonal Vaccine 67yrs & older 08/02/2023   Moderna Sars-Covid-2 Vaccination 11/27/2019, 12/26/2019, 06/22/2020, 12/30/2020, 06/21/2021   Pneumococcal Conjugate-13 03/23/2019   Pneumococcal Polysaccharide-23 08/22/2020   Pneumococcal-Unspecified 02/12/2010   Tdap 02/12/2010, 10/17/2016   Zoster Recombinant(Shingrix ) 02/20/2021    Screening Tests Health Maintenance  Topic Date Due   Zoster Vaccines-  Shingrix  (2 of 2) 04/17/2021   FOOT EXAM  04/21/2024   COVID-19 Vaccine (7 - Moderna risk 2024-25 season) 06/15/2024   HEMOGLOBIN A1C  12/15/2024   OPHTHALMOLOGY EXAM  04/27/2025   Diabetic kidney evaluation - eGFR measurement  06/08/2025   Diabetic kidney evaluation - Urine ACR  06/17/2025   Medicare Annual Wellness (AWV)  06/23/2025   MAMMOGRAM  07/04/2025   Colonoscopy  08/23/2026   DTaP/Tdap/Td (3 - Td or Tdap) 10/17/2026   Pneumococcal Vaccine: 50+ Years  Completed   Influenza Vaccine  Completed   DEXA SCAN  Completed   Hepatitis C Screening  Completed   HPV VACCINES  Aged Out   Meningococcal B Vaccine  Aged Out    Health Maintenance Items Addressed:   Additional Screening:  Vision Screening: Recommended annual ophthalmology exams for early detection of glaucoma and other disorders of the eye. Is the patient up to date with their annual eye exam? Yes Who is the provider or what is the name of the office in which the patient attends annual eye exams? [facility]  Dental Screening: Recommended annual dental exams for proper oral hygiene  Community Resource Referral / Chronic Care Management: CRR required this visit?  No   CCM required this visit?  No   Plan:    I have personally reviewed and noted the following in the patient's chart:  Medical and social history Use of alcohol, tobacco or illicit drugs  Current medications and supplements including opioid prescriptions. Patient is not currently taking opioid prescriptions. Functional ability and status Nutritional status Physical activity Advanced directives List of other physicians Hospitalizations, surgeries, and ER visits in previous 12 months Vitals Screenings to include cognitive, depression, and falls Referrals and appointments  In addition, I have reviewed and discussed with patient certain preventive protocols, quality metrics, and best practice recommendations. A written personalized care plan  for preventive services as well as general preventive health recommendations were provided to patient.   Rojelio LELON Blush, LPN   [id number]   After Visit Summary: (MyChart) Due to this being a telephonic visit, the after visit summary with patients personalized plan was offered to patient via MyChart   Notes: Nothing significant to report at this time.

## 2024-07-07 DIAGNOSIS — I480 Paroxysmal atrial fibrillation: Secondary | ICD-10-CM | POA: Diagnosis not present

## 2024-07-07 DIAGNOSIS — I5032 Chronic diastolic (congestive) heart failure: Secondary | ICD-10-CM | POA: Diagnosis not present

## 2024-07-07 DIAGNOSIS — M7989 Other specified soft tissue disorders: Secondary | ICD-10-CM | POA: Diagnosis not present

## 2024-07-08 LAB — BASIC METABOLIC PANEL WITH GFR
BUN/Creatinine Ratio: 12 (ref 12–28)
BUN: 14 mg/dL (ref 8–27)
CO2: 27 mmol/L (ref 20–29)
Calcium: 9.3 mg/dL (ref 8.7–10.3)
Chloride: 95 mmol/L — ABNORMAL LOW (ref 96–106)
Creatinine, Ser: 1.16 mg/dL — ABNORMAL HIGH (ref 0.57–1.00)
Glucose: 105 mg/dL — ABNORMAL HIGH (ref 70–99)
Potassium: 3.9 mmol/L (ref 3.5–5.2)
Sodium: 138 mmol/L (ref 134–144)
eGFR: 51 mL/min/1.73 — ABNORMAL LOW (ref >59)

## 2024-07-22 ENCOUNTER — Encounter: Payer: Self-pay | Admitting: Internal Medicine

## 2024-07-22 ENCOUNTER — Other Ambulatory Visit: Payer: Self-pay

## 2024-07-22 ENCOUNTER — Ambulatory Visit: Admitting: Internal Medicine

## 2024-07-22 VITALS — BP 130/74 | HR 76 | Wt 250.0 lb

## 2024-07-22 DIAGNOSIS — Z794 Long term (current) use of insulin: Secondary | ICD-10-CM | POA: Diagnosis not present

## 2024-07-22 DIAGNOSIS — E1159 Type 2 diabetes mellitus with other circulatory complications: Secondary | ICD-10-CM | POA: Diagnosis not present

## 2024-07-22 LAB — POCT GLYCOSYLATED HEMOGLOBIN (HGB A1C): Hemoglobin A1C: 6.3 % — AB (ref 4.0–5.6)

## 2024-07-22 MED ORDER — DEXCOM G7 SENSOR MISC: 1.0000 | 3 refills | Status: DC

## 2024-07-22 MED ORDER — INSULIN LISPRO 100 UNIT/ML IJ SOLN: INTRAMUSCULAR | 3 refills | Status: AC

## 2024-07-22 MED ORDER — TIRZEPATIDE 15 MG/0.5ML ~~LOC~~ SOAJ: 15.0000 mg | SUBCUTANEOUS | 3 refills | Status: AC

## 2024-07-22 MED ORDER — DEXCOM G7 SENSOR MISC: 1.0000 | 3 refills | Status: AC

## 2024-07-22 MED ORDER — OMNIPOD 5 G7 PODS (GEN 5) MISC
1.0000 | 3 refills | Status: AC
Start: 2024-07-22 — End: ?

## 2024-07-22 NOTE — Patient Instructions (Signed)
 Increase Mounjaro  15 mg weekly   HOW TO TREAT LOW BLOOD SUGARS (Blood sugar LESS THAN 70 MG/DL) Please follow the RULE OF 15 for the treatment of hypoglycemia treatment (when your (blood sugars are less than 70 mg/dL)   STEP 1: Take 15 grams of carbohydrates when your blood sugar is low, which includes:  3-4 GLUCOSE TABS  OR 3-4 OZ OF JUICE OR REGULAR SODA OR ONE TUBE OF GLUCOSE GEL    STEP 2: RECHECK blood sugar in 15 MINUTES STEP 3: If your blood sugar is still low at the 15 minute recheck --> then, go back to STEP 1 and treat AGAIN with another 15 grams of carbohydrates.

## 2024-07-22 NOTE — Progress Notes (Signed)
 Name: Lynn [name]  Age/ Sex: 68 y.o., female   MRN/ DOB: [id number], 04-21-1956     PCP: Watt [name], MD   Reason for Endocrinology Evaluation: Type 2 Diabetes Mellitus  Initial Endocrine Consultative Visit: 03/31/2019    PATIENT IDENTIFIER: Lynn [name] is a 68 y.o. female with a past medical history of T2DM, HTN, dyslipidemia,thalassemia, OSA on CPAP . The patient has followed with Endocrinology clinic since 03/31/2019 for consultative assistance with management of her diabetes.  DIABETIC HISTORY:  Lynn [name] was diagnosed with T2DM in 2010, She has been on metformin  in the past with reported prior intolerance due to diarrhea. Was on Glipizide  at some point with no side effects.Has been on insulin  for years. Her hemoglobin A1c has ranged from  7.7% in 2018, peaking at 10.4% in 2020.  On her initial presentation to our clinic, her A1c was 10.4% , she was on Lantus  and Victoza     Farxiga  was tried in 03/2019 but developed severe dizziness .     Injectable methotrexate was started November 2020 due to rheumatoid arthritis.  Patient is intolerant to oral methotrexate   Started on Omnipod 10/2020  Switched Ozempic  to Mounjaro  08/2022 due to shortage of supply   SUBJECTIVE:   During the last visit (10/28/2023): This was a virtual visit     Today (07/22/2024): Lynn [name] is here for a month follow up on diabetes management.  She continues to check glucose multiple times daily through Dexcom.  She was noted with hypoglycemic episodes, but the patient indicates these are false readings in confirms with a fingerstick.  Patient follows with cardiology for CHF, CAD and HTN She continues to use nasal cannula She also follows with rheumatology for rheumatoid arthritis , on tylenol  only  Patient follows with GI for history of GI bleed, anemia Patient continues to be on oxygen  for interstitial lung disease/COPD  She was evaluated at [facility] in April, 2025  for severe mitral stenosis, was deemed not a surgical candidate, medical management was recommended  She has headaches after taking mounjaro  recently associated with a bout of loose stool that she attributes to change location of injection site   This patient with type 2 diabetes is treated with Omnipod (insulin  pump). During the visit the pump basal and bolus doses were reviewed including carb/insulin  rations and supplemental doses. The clinical list was updated. The glucose meter download was reviewed in detail to determine if the current pump settings are providing the best glycemic control without excessive hypoglycemia.   Pump and meter download:     Pump   Omnipod Settings   Insulin  type   Humalog     Basal rate       0000  2.30 u/h    0930 2.4          I:C ratio       0000 1:9   11:30 1:8              Sensitivity       0000  10      Goal       0000  110           Type & Model of Pump: Omnipod Insulin  Type: Currently using Humalog  .  PUMP STATISTICS: Average BG: 161 Average Daily Carbs (g): No data Average Total Daily Insulin : 46.7 Average Daily Basal: 41.8 (90 %) Average Daily Bolus: 4.8 (10 %)      HOME DIABETES REGIMEN:  Mounjaro   12.5  mg weekly Humalog      CONTINUOUS GLUC 9/25 - 07/22/2024  Sensor description:Dexcom  Results statistics:   CGM use % of time 88.3  Average and SD 161/34  Time in range 74 %  % Time Above 180 25  % Time above 250 1  % Time Below target 0    Glycemic patterns summary: BGs are optimal throughout the night and most of the day Hyperglycemic episodes  Post prandial  Hypoglycemic episodes occurred  n/a Overnight periods: Optimal   DIABETIC COMPLICATIONS: Microvascular complications:  Retinopathy (bleeding in the eye )  Denies: CKD, neuropathy  Last eye exam: Completed 03/2023   Macrovascular complications:  CAD Denies: PVD, CVA       HISTORY:  Past Medical History:  Past Medical History:   Diagnosis Date   Allergy 2018   Anxiety    CHF (congestive heart failure) (HCC)    Clotting disorder    blood clot in eye 2016   COPD (chronic obstructive pulmonary disease) (HCC)    Coronary artery disease, non-occlusive    a. 11/2016 NSTEMI/Cath: LM nl, LAD 40p, 50md, D1/2 small, LCX 40ost, OM2/3 nl/small, RCA 35 ost/mid, RPDA/RPL small/nl.   Coronary vasospasm    Depression    Diastolic dysfunction    a. 11/2016 Echo: EF 55-60%, gr1 DD, sev calcified MV annulus, mildly dil LA.   Eye hemorrhage 01/2015   right; resolved (07/03/2017)   Gastric ulcer    GERD (gastroesophageal reflux disease)    Headache    monthly (07/03/2017)   Heart murmur    History of blood transfusion    when I had an ectopic pregnancy   History of stomach ulcers    Hyperlipidemia    Hypertension    Microcytic anemia    Moderate mitral stenosis by prior echocardiogram 03/2021   TTE: Mod MS (mean gradient 9 mmHg). -- TEE Moderate-Severe MS (mean gradient 14 mmHg) with fixed Post MV Leaflet & Severe MAC w/ large circumscribed calcified mass on the Post MV Leaflet. (Mass previously described in 2018) - CMRI identifies calcified mass & not Tumor.; R&LHC - LVEDP & PCWP both 23 mmHg indicating minimal resting gradient   Myocardial infarction (HCC) 2023   OSA on CPAP    setting is unknown   Oxygen  deficiency    PAF (paroxysmal atrial fibrillation) (HCC) 03/2021   Event Monitor Aug-Sept 2022: Predominantly sinus rhythm.  1% A. fib burden.  30 brief episodes of PAT.  2 short bursts of NSVT.   PAT (paroxysmal atrial tachycardia)    Event monitor from August 2022 showed 30 brief bursts longest 14 seconds.   Pneumonia    several times (07/03/2017)   PVC's (premature ventricular contractions)    Rheumatoid arthritis (HCC)    Sickle cell anemia (HCC)    Sleep apnea    Stroke (HCC)    Syncope 07/03/2017 X 2   called seizures but no medications   Thalassemia    my cells are sickle cell shaped but I don't have  sickle cell anemia (07/03/2017)   Type II diabetes mellitus (HCC)    Past Surgical History:  Past Surgical History:  Procedure Laterality Date   48-Hour Monitor  03/18/2017   Sinus rhythm with sinus tachycardia (rate 58-134 BPM)multiple PVCs noted with couplets and bigeminy. One triplet. 7 runs of PAT ranging from 100-130 bpm. Longest was 33 beats.   BALLOON ENTEROSCOPY N/A 08/13/2023   Procedure: BALLOON ENTEROSCOPY;  Surgeon: San Sandor GAILS, DO;  Location: [facility];  Service: Gastroenterology;  Laterality: N/A;   BIOPSY  04/03/2021   Procedure: BIOPSY;  Surgeon: Leigh Elspeth SQUIBB, MD;  Location: [facility];  Service: Gastroenterology;;   BIOPSY  12/17/2022   Procedure: BIOPSY;  Surgeon: Abran Norleen SAILOR, MD;  Location: [facility];  Service: Gastroenterology;;   BREAST BIOPSY Left    BRONCHIAL WASHINGS  03/28/2021   Procedure: BRONCHIAL WASHINGS;  Surgeon: Gretta Leita SQUIBB, DO;  Location: [facility];  Service: Endoscopy;;   CARDIAC CATHETERIZATION N/A 11/19/2016   Procedure: Left Heart Cath and Coronary Angiography;  Surgeon: Victory LELON Sharps, MD;  Location: [facility] INVASIVE CV LAB;  Service: Cardiovascular.    LM nl, LAD 40p, 50md, D1/2 small, LCX 40ost, OM2/3 nl/small, RCA 35 ost/mid, RPDA/RPL small/nl.   CARDIAC MRI  03/30/2021   Normal LVEF ~53%. 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 No Scar or Late Gadolinium Enhancement on LV Myocardium.   CARPAL TUNNEL RELEASE Right 11/01/2014   COLONOSCOPY     DILATION AND CURETTAGE OF UTERUS     ECTOPIC PREGNANCY SURGERY  X 2   ENTEROSCOPY N/A 09/05/2022   Procedure: ENTEROSCOPY;  Surgeon: Wilhelmenia Aloha Raddle., MD;  Location: [facility];  Service: Gastroenterology;  Laterality: N/A;   ENTEROSCOPY N/A 07/17/2023   Procedure: ENTEROSCOPY;  Surgeon: Abran Norleen SAILOR, MD;  Location: [facility];  Service: Gastroenterology;  Laterality: N/A;   ESOPHAGOGASTRODUODENOSCOPY N/A 12/17/2022   Procedure:  ESOPHAGOGASTRODUODENOSCOPY (EGD);  Surgeon: Abran Norleen SAILOR, MD;  Location: [facility];  Service: Gastroenterology;  Laterality: N/A;   ESOPHAGOGASTRODUODENOSCOPY (EGD) WITH PROPOFOL  N/A 04/03/2021   Procedure: ESOPHAGOGASTRODUODENOSCOPY (EGD) WITH PROPOFOL ;  Surgeon: Leigh Elspeth SQUIBB, MD;  Location: [facility];  Service: Gastroenterology;  Laterality: N/A;   ESOPHAGOGASTRODUODENOSCOPY (EGD) WITH PROPOFOL  N/A 08/15/2021   Procedure: ESOPHAGOGASTRODUODENOSCOPY (EGD) WITH PROPOFOL ;  Surgeon: Aneita Gwendlyn DASEN, MD;  Location: [facility];  Service: Endoscopy;  Laterality: N/A;   GIVENS CAPSULE STUDY N/A 07/17/2023   Procedure: GIVENS CAPSULE STUDY;  Surgeon: Abran Norleen SAILOR, MD;  Location: [facility];  Service: Gastroenterology;  Laterality: N/A;   HOT HEMOSTASIS N/A 09/05/2022   Procedure: HOT HEMOSTASIS (ARGON PLASMA COAGULATION/BICAP);  Surgeon: Wilhelmenia Aloha Raddle., MD;  Location: [facility];  Service: Gastroenterology;  Laterality: N/A;   HOT HEMOSTASIS N/A 08/13/2023   Procedure: HOT HEMOSTASIS (ARGON PLASMA COAGULATION/BICAP);  Surgeon: San Sandor GAILS, DO;  Location: [facility];  Service: Gastroenterology;  Laterality: N/A;   JOINT REPLACEMENT     KNEE ARTHROSCOPY Left 11/2014   RIGHT/LEFT HEART CATH AND CORONARY ANGIOGRAPHY N/A 08/08/2021   Procedure: RIGHT/LEFT HEART CATH AND CORONARY ANGIOGRAPHY;  Surgeon: Anner Alm LELON, MD;  Location: [facility] INVASIVE CV LAB;  Service: Cardiovascular;Ost LCx 40%. Ost-Prox RCA 35%. Prox-Mid LAD 25%-<25% D2. Mod-Severely Elevated LVEDP. Mild PA HTN -> PA mean 31 mmHg with a PCWP and LVEDP of 23 mmHg   SUBMUCOSAL TATTOO INJECTION  09/05/2022   Procedure: SUBMUCOSAL TATTOO INJECTION;  Surgeon: Wilhelmenia Aloha Raddle., MD;  Location: [facility];  Service: Gastroenterology;;   SUBMUCOSAL TATTOO INJECTION  07/17/2023   Procedure: SUBMUCOSAL TATTOO INJECTION;  Surgeon: Abran Norleen SAILOR, MD;  Location: [facility];  Service: Gastroenterology;;   TEE  WITHOUT CARDIOVERSION N/A 03/28/2021   Procedure: TRANSESOPHAGEAL ECHOCARDIOGRAM (TEE);  Surgeon: Alveta Aleene PARAS, MD;  Location: [facility] ENDOSCOPY;; LVEF 55-60%. Normal LV Fxn & no RWMA. No LAA thrombus. Gr II Ao Plaque. Large circumscribed calcific mass (1.8 x 1.4 cm) anterior to posterior annulus.  Fixed posterior  leaflet with Mod-Severe MS (Mean MVG ~14 mmHg, Peak 20 mmhg).  Mild MR   TONSILLECTOMY     TOTAL KNEE ARTHROPLASTY Right 02/18/2015   Procedure: RIGHT TOTAL KNEE ARTHROPLASTY;  Surgeon: Marcey Her, MD;  Location: [facility] OR;  Service: Orthopedics;  Laterality: Right;   TOTAL KNEE ARTHROPLASTY Left 08/19/2015   Procedure: LEFT TOTAL KNEE ARTHROPLASTY;  Surgeon: Marcey Her, MD;  Location: [facility] OR;  Service: Orthopedics;  Laterality: Left;   TRANSTHORACIC ECHOCARDIOGRAM  11/2016; 07/2017:   a) normal LV size, thickness and function. EF 55-60%. GR 1 DD.No RWMA severe mitral annular calcification, but no mitral stenosis. Mild LA dilation;; b) normal LV size and function.  EF 65-75%.  Unable to assess diastolic function.  Aortic sclerosis with no stenosis.  Moderate mitral stenosis? (Gradient 9 mmHg) -?  Mild-mod left atrial enlargement    TRANSTHORACIC ECHOCARDIOGRAM  01/2019   EF 65 to 70%.  Normal function.  Elevated LVEDP-GR 1 DD.  Normal RV size and function.  Large calcific mass on posterior mitral leaflet mild to moderate mitral stenosis.   TRANSTHORACIC ECHOCARDIOGRAM  03/24/2021   EF 60 to 65%.  LV function with normal wall motion.  Moderate basal septal LVH.  GRII DD & Mild LA dilation.-> elevated LVEDP.  Normal RV function.  Mildly elevated PAP.  Ill-defined density measuring 3.23 x 2.1 cm region of the posterior MV leaflet along with dense MAC.  Moderate MS (mean MVG of 9 mmHg.) Normal AoV & RAP. --> Recommend TEE 2/2 concern for MV SBE.   TRANSTHORACIC ECHOCARDIOGRAM  04/26/2022   EF 60 to 65%.  No RWMA.  Moderate LVH with GR 1 DD.  Normal RV size and function.  Mild LA dilation.  Large  calcific mass of posterior MV leaflet.  Mean PG 13 mm 3.  Trivial MR.  Mild to moderate MS.  Severe MAC.  Normal AOV.  Normal RAP.   VAGINAL HYSTERECTOMY     VIDEO BRONCHOSCOPY N/A 03/28/2021   Procedure: VIDEO BRONCHOSCOPY WITHOUT FLUORO;  Surgeon: Gretta Leita SQUIBB, DO;  Location: [facility] ENDOSCOPY;  Service: Endoscopy;  Laterality: N/A;   Social History:  reports that she quit smoking about 9 years ago. Her smoking use included cigarettes. She started smoking about 53 years ago. She has a 11 pack-year smoking history. She has never used smokeless tobacco. She reports that she does not currently use alcohol. She reports that she does not use drugs. Family History:  Family History  Problem Relation Age of Onset   Cancer Mother    Diabetes Mother    Hypertension Mother    Hyperlipidemia Mother    Cancer Father    Hyperlipidemia Father    Mental illness Sister    Diabetes Sister    Diabetes Maternal Grandmother    Diabetes Maternal Grandfather    Colon cancer Neg Hx    Esophageal cancer Neg Hx    Stomach cancer Neg Hx    Rectal cancer Neg Hx    Tremor Neg Hx    Parkinson's disease Neg Hx      HOME MEDICATIONS: Allergies as of 07/22/2024       Reactions   Methotrexate Other (See Comments)   Pneumonitis   Penicillins Hives   Childhood allergy Has patient had a PCN reaction causing immediate rash, facial/tongue/throat swelling, SOB or lightheadedness with hypotension: Yes Has patient had a PCN reaction causing severe rash involving mucus membranes or skin necrosis: No Has patient had a PCN reaction that required hospitalization No  Has patient had a PCN reaction occurring within the last 10 years: No If all of the above answers are NO, then may proceed with Cephalosporin use.   Farxiga  [dapagliflozin ] Other (See Comments)   Dizzy and lethargic   Metformin  And Related Diarrhea, Other (See Comments)   Also, bleeding        Medication List        Accurate as of July 22, 2024   1:23 PM. If you have any questions, ask your nurse or doctor.          acetaminophen  500 MG tablet Commonly known as: TYLENOL  Take 1,000 mg by mouth every 6 (six) hours as needed (for pain).   albuterol  (2.5 MG/3ML) 0.083% nebulizer solution Commonly known as: PROVENTIL  INHALE 3 ML BY NEBULIZATION EVERY 6 HOURS AS NEEDED FOR WHEEZING OR SHORTNESS OF BREATH   albuterol  108 (90 Base) MCG/ACT inhaler Commonly known as: VENTOLIN  HFA TAKE 2 PUFFS BY MOUTH EVERY 6 HOURS AS NEEDED FOR WHEEZE OR SHORTNESS OF BREATH   ALPRAZolam  0.25 MG tablet Commonly known as: XANAX  Take 1 tablet (0.25 mg total) by mouth 2 (two) times daily as needed for anxiety. TAKE 1-2 TABLETS BY MOUTH 2 TIMES DAILY AS NEEDED FOR ANXIETY OR INSOMNIA   apixaban  5 MG Tabs tablet Commonly known as: ELIQUIS  Take 1 tablet (5 mg total) by mouth 2 (two) times daily.   atorvastatin  80 MG tablet Commonly known as: LIPITOR  Take 1 tablet (80 mg total) by mouth every evening.   B-D UF III MINI PEN NEEDLES 31G X 5 MM Misc Generic drug: Insulin  Pen Needle USE DAILY WITH VICTOZA  AND LANTUS .   Insulin  Pen Needle 31G X 5 MM Misc 1 Device by Does not apply route 3 (three) times daily.   cetirizine 10 MG tablet Commonly known as: ZYRTEC Take 10 mg by mouth daily as needed for allergies.   Contour Next Test test strip Generic drug: glucose blood 3x daily   cyclobenzaprine  5 MG tablet Commonly known as: FLEXERIL  Take 1 tablet (5 mg total) by mouth 3 (three) times daily as needed for muscle spasms.   Dexcom G7 Sensor Misc 1 Device by Does not apply route as directed.   diltiazem  360 MG 24 hr capsule Commonly known as: CARDIZEM  CD TAKE 1 CAPSULE BY MOUTH EVERY DAY   folic acid  1 MG tablet Commonly known as: FOLVITE  Take 2 tablets (2 mg total) by mouth daily. What changed: how much to take   furosemide  40 MG tablet Commonly known as: LASIX  Take 40 mg by mouth in the morning and an additional 40 mg for an overnight  weight gain of 3 pounds or 5 pounds in a week   hydroxychloroquine  200 MG tablet Commonly known as: PLAQUENIL  Take 200 mg by mouth 2 (two) times daily.   insulin  lispro 100 UNIT/ML injection Commonly known as: HUMALOG  Max Daily 70 units per pump   isosorbide  mononitrate 30 MG 24 hr tablet Commonly known as: IMDUR  Take 1 tablet (30 mg total) by mouth daily.   Lancets 30G Misc 1 Device by Does not apply route 3 (three) times daily.   leflunomide  20 MG tablet Commonly known as: ARAVA  Take 20 mg by mouth daily.   losartan  100 MG tablet Commonly known as: COZAAR  Take 0.5 tablets (50 mg total) by mouth daily.   Melatonin 5 MG Chew Chew 10 mg by mouth at bedtime.   multivitamin with minerals Tabs tablet Take 1 tablet by mouth daily with breakfast.  Omnipod 5 G7 Pods (Gen 5) Misc Use 1 pod every other day.   pantoprazole  40 MG tablet Commonly known as: PROTONIX  Take 1 tablet (40 mg total) by mouth daily before breakfast.   SAFETY-LOK TB SYRINGE 27GX.5 27G X 1/2 1 ML Misc Generic drug: TUBERCULIN SYR 1CC/27GX1/2   sodium chloride  0.65 % Soln nasal spray Commonly known as: OCEAN Place 1 spray into both nostrils as needed. What changed: reasons to take this   spironolactone  25 MG tablet Commonly known as: ALDACTONE  Take 1 tablet (25 mg total) by mouth daily.   tirzepatide  12.5 MG/0.5ML Pen Commonly known as: MOUNJARO  Inject 12.5 mg into the skin once a week.   traZODone  50 MG tablet Commonly known as: DESYREL  Take 0.5-1 tablets (25-50 mg total) by mouth at bedtime as needed for sleep. Needs appt   VICKS SINEX NA Place 1 spray into both nostrils every 12 (twelve) hours as needed (for congestion- MAX OF THREE CONSECUTIVE DAYS).         OBJECTIVE:   Vital Signs: BP 130/74 (BP Location: Left Arm, Patient Position: Sitting, Cuff Size: Large)   Pulse 76   Wt 250 lb (113.4 kg) Comment: patient reported  LMP  (LMP Unknown)   SpO2 96%   BMI 44.29 kg/m      Wt Readings from Last 3 Encounters:  07/22/24 250 lb (113.4 kg)  06/23/24 251 lb (113.9 kg)  06/17/24 251 lb (113.9 kg)     Exam:   Exam: General: Pt appears well and is in NAD  Lungs: Clear with good BS bilat   Heart: RRR   Extremities: No pretibial edema.   Neuro: MS is good with appropriate affect, pt is alert and Ox3        DM foot exam: 07/22/2024 The skin of the feet is intact without sores or ulcerations. The pedal pulses are 2+ on right and 2+ on left. The sensation is intact to a screening 5.07, 10 gram monofilament bilaterally    DATA REVIEWED:  Lab Results  Component Value Date   HGBA1C 7.1 (H) 06/17/2024   HGBA1C 6.8 (A) 04/22/2023   HGBA1C 6.5 (H) 12/15/2022         Latest Reference Range & Units 07/24/23 05:50  Sodium 135 - 145 mmol/L 140  Potassium 3.5 - 5.1 mmol/L 3.4 (L)  Chloride 98 - 111 mmol/L 94 (L)  CO2 22 - 32 mmol/L 33 (H)  Glucose 70 - 99 mg/dL 848 (H)  BUN 8 - 23 mg/dL 13  Creatinine 9.55 - 8.99 mg/dL 8.83 (H)  Calcium  8.9 - 10.3 mg/dL 9.3  Anion gap 5 - 15  13  Alkaline Phosphatase 38 - 126 U/L 65  Albumin 3.5 - 5.0 g/dL 3.5  AST 15 - 41 U/L 16  ALT 0 - 44 U/L 20  Total Protein 6.5 - 8.1 g/dL 7.1  Total Bilirubin 0.3 - 1.2 mg/dL 0.5  GFR, Estimated >39 mL/min 51 (L)    ASSESSMENT / PLAN / RECOMMENDATIONS:   1) Type 2 Diabetes Mellitus, Optimally controlled, With retinopathy and macrovascular complications -A1c 6.3%   -A1c is optimal at 6.3% -Patient has been noted with postprandial hyperglycemia, she also continues with no consistent bolusing, I have recommended increasing Mounjaro  -She endorses nausea with Ozempic  - No other changes at this time  MEDICATIONS: Increase Mounjaro  15 mg weekly  Humalog  per pump   Pump   Omnipod Settings   Insulin  type   Humalog     Basal rate  0000  2.20 u/h    0930 2.40          I:C ratio       0000 1:9   11:30 1:8              Sensitivity       0000  10       Goal       0000  110           EDUCATION / INSTRUCTIONS: BG monitoring instructions: Patient is instructed to check her blood sugars 3 times a day, fasting , supper and bedtime. Call [facility] if: BG persistently < 70  I reviewed the Rule of 15 for the treatment of hypoglycemia in detail with the patient. Literature supplied.    F/U in 6 months    Signed electronically by: Stefano Redgie Butts, MD  [facility]  [facility] [address] [place], [place] [address] Phone: [phone] FAX: [phone]   CC: Watt [name], MD [address] [place] [address] Phone: [phone]  Fax: [phone]  Return to Endocrinology clinic as below: Future Appointments  Date Time Provider Department Center  07/28/2024  1:00 PM [facility] D[facility]A 1 [facility]  07/28/2024  1:40 PM [facility]  08/14/2024  2:40 PM [name], MD [facility] H&V  06/29/2025  1:10 PM [facility] VISIT 2 [facility] [address]

## 2024-07-23 ENCOUNTER — Encounter: Payer: Self-pay | Admitting: Internal Medicine

## 2024-07-25 ENCOUNTER — Other Ambulatory Visit: Payer: Self-pay | Admitting: Internal Medicine

## 2024-07-26 ENCOUNTER — Ambulatory Visit: Payer: Self-pay | Admitting: Internal Medicine

## 2024-07-26 NOTE — Progress Notes (Signed)
 Patient is overdue for a follow-up. Please call and schedule an appointment to discuss results. PLEASE SCHEDULE WITH APP

## 2024-07-27 ENCOUNTER — Encounter: Payer: Self-pay | Admitting: Internal Medicine

## 2024-07-27 NOTE — Progress Notes (Signed)
 ATC 1x Left VM sent LTR

## 2024-07-28 ENCOUNTER — Encounter (HOSPITAL_BASED_OUTPATIENT_CLINIC_OR_DEPARTMENT_OTHER): Payer: Self-pay

## 2024-07-28 ENCOUNTER — Ambulatory Visit (HOSPITAL_BASED_OUTPATIENT_CLINIC_OR_DEPARTMENT_OTHER)
Admission: RE | Admit: 2024-07-28 | Discharge: 2024-07-28 | Disposition: A | Discharge status: Home / Self Care | Source: Ambulatory Visit | Attending: Family Medicine | Admitting: Family Medicine

## 2024-07-28 DIAGNOSIS — E2839 Other primary ovarian failure: Secondary | ICD-10-CM | POA: Diagnosis present

## 2024-07-28 DIAGNOSIS — Z1231 Encounter for screening mammogram for malignant neoplasm of breast: Secondary | ICD-10-CM | POA: Insufficient documentation

## 2024-07-28 DIAGNOSIS — Z780 Asymptomatic menopausal state: Secondary | ICD-10-CM | POA: Diagnosis not present

## 2024-07-29 ENCOUNTER — Encounter: Payer: Self-pay | Admitting: Family Medicine

## 2024-08-10 ENCOUNTER — Ambulatory Visit: Admitting: Cardiology

## 2024-08-13 ENCOUNTER — Ambulatory Visit: Admitting: Internal Medicine

## 2024-08-13 ENCOUNTER — Encounter: Payer: Self-pay | Admitting: Internal Medicine

## 2024-08-13 ENCOUNTER — Telehealth: Payer: Self-pay | Admitting: Internal Medicine

## 2024-08-13 VITALS — BP 132/66 | HR 70 | Ht 63.0 in | Wt 253.2 lb

## 2024-08-13 DIAGNOSIS — G4733 Obstructive sleep apnea (adult) (pediatric): Secondary | ICD-10-CM

## 2024-08-13 DIAGNOSIS — I342 Nonrheumatic mitral (valve) stenosis: Secondary | ICD-10-CM

## 2024-08-13 DIAGNOSIS — J849 Interstitial pulmonary disease, unspecified: Secondary | ICD-10-CM | POA: Diagnosis not present

## 2024-08-13 NOTE — Telephone Encounter (Signed)
 Dear Lynn [name] is complaining of worsening dyspnea.  CT scan pulmonary infiltrates are worse although radiology is worried about CTEPH.  Last right heart catheterization was in 2022.  Therefore please consider right heart catheterization.  This can be helpful because if there is worsening pulmonary ILD then I would consider antifibrotic's and immunomodulator treatment.    Thanks    SIGNATURE    Dr. Dorethia Cave, M.D., F.C.C.P,  Pulmonary and Critical Care Medicine Staff Physician, [facility] Director - Interstitial Lung Disease  Program  Pulmonary Fibrosis [facility] at [facility] [place], [place], [address]   Pager: [phone], If no answer  -> Check AMION or Try [phone] Telephone (clinical office): [phone] Telephone (research): [phone]  2:33 PM 08/13/2024

## 2024-08-13 NOTE — Patient Instructions (Addendum)
 ILD (interstitial lung disease) (HCC) Hypersensitivity pneumonitis (HCC)  V RA  - Symptoms are worse and along with the c/o CT scan is worse. -Unclear if the CT scan is worse because of pulmonary infiltrates or edema   Plan  --Sending message to Dr. Anner to consider right heart catheterization - Further treatment based on right heart catheterization results  - If there is no significant pulmonary hypertension then we will consider immunomodulator or antifibrotic for worsening lung disease    OSA on CPAP  - not using cPAP - no sleep doctor  Plan  - per sleep doc;    Moderate mitral stenosis by prior echocardiography  - deemed high risk for surgery by [facility] April 2025  Plan   - per Dr Anner;  - I have sent a message to Dr. Anner to consider right heart catheterization  Preoperative respiratory exam prior to colonoscopy  -Risk from pulmonary complications from colonoscopy related sedation and anesthesia can be lowered if pulmonary hypertension ruled out  Plan - Recommending right heart catheterization   FOllowu; 6 weeks with nurse practitioner Dr. Geronimo 15-minute visit

## 2024-08-13 NOTE — Telephone Encounter (Signed)
 Sounds good.  I am seeing her later this week.

## 2024-08-13 NOTE — Progress Notes (Signed)
 @Patient  ID: Lynn [name], female    DOB: [birth year], 68 y.o.   MRN: [id number]  Chief Complaint  Patient presents with   Follow-up    06-09/22, She reports having trouble catching her breath at times. She has clear sputum at times.      04/02/21 PCCM Hospital consult -Dr. Geronimo 68 year old female, former smoker quit in May 2016 (11-pack-year history).  Past medical history significant for rheumatoid arthritis on methotrexate, hypertension, heart failure with preserved ejection fraction, type 2 diabetes, chronic anemia thalassemia. Former patient of Dr. Darlean, last seen in office on 01/14/2018.  68 year old woman with PMHx significant for HTN, HFpEF (Echo 03/2021 EF 55-60%), T2DM, chronic anemia 2/2 thalassemia, osteoarthritis (s/p bilateral TKA) and rheumatoid arthritis (on methotrexate) admitted to [facility] 03/23/21 with acute hypoxemic respiratory failure.   Patient was recently treated with doxycycline  and azithromycin  for presumed bronchitis. Febrile this admission to Tmax of 101.33F with dry cough. CTA Chest 6/7 demonstrating scattered diffuse areas of ground glass attenuation, negative for pulmonary emboli. COVID and influenza PCRs negative.   PCCM consulted for further evaluation/recommendations for respiratory failure and fevers.  Admitted 03/23/2021 6/11 with increasing oxygen  needs to 5L 6/14 - Bronch BAL - 64% lymphs, 32% mono, Cytology without malig cells. Culture negative as of 6/16.  Started Solu-Medrol .  Gives history of methotrexate injection and also feather pillow exposure 6/16 - CT Chest completed with GGOs, negative for PE. Cardiac MRI demonstrating mitral annular mass c/w degenerative mitral annular calcification. Melena noted in PM, FOBT+. 6/17 - GI consulted for melena/Hgb drift. Plan for EGD pending pulmonary clearance for procedure. Ceftriaxone  started.  3 L oxygen  need    04/26/2021 - PV Patient presents today for hospital follow-up. She was admitted on  03/23/21 for acute hypoxic respiratory failure secondary to interstitial lung disease.  Patient underwent bronchoscopy on 6/14 showed predominant lymphocytosis greater than 60% on BAL, felt to be consistent with methotrexate hypersensitivity pneumonitis or feather pillow hypersensitivity pneumonitis or combination; rarely it can be ill IP from rheumatoid arthritis. CT chest on 03/30/2021 showed marked increase peribronchovascular groundglass and upper/mid lung zone with interstitial thickening and scattered collapse/consolidation. Suggestive of alternative diagnosis (not UIP).  Infectious disease was consulted on 03/31/2021 recommending Bactrim  prophylactically if on prednisone  greater than 20 mg/day for 2 months.  Today she is doing some better. She has an occasional dry cough, recently started coughing up some clear mucus.  Patient states that she never in fact had a feather pillow.  She has been encouraged to discuss alternative options to methotrexate with her rheumatologist.  She is currently on slow prednisone  taper for the next 2 to 3 months started on 04/03/21 week 25 of 2022 (40 mg/day x 2 weeks; 30 mg/day x 2 weeks; 20 mg/day x 2 weeks; 10 mg/day x 2 weeks; 5 mg/day x 2 weeks and then 5 mg Monday Wednesday Friday x2 weeks). She is taking Bactrim  DS daily per ID. She is also taking Protonix  twice a day. She is using 2L oxygen  with exertion and at night.   Results for Lynn [name] (MRN [id number]) as of 06/28/2021 15:00  Ref. Range 03/28/2021 14:10  Monocyte-Macrophage-Serous Fluid Latest Ref Range: 50 - 90 % 32 (L)  Fluid Type-FCT Unknown Bronch Lavag  Color, Fluid Latest Ref Range: YELLOW  COLORLESS (A)  Total Nucleated Cell Count, Fluid Latest Ref Range: 0 - 1,000 cu mm 91  Lymphs, Fluid Latest Units: % 64  Eos, Fluid Latest Units: % 1  Appearance, Fluid Latest Ref Range: CLEAR  CLEAR (A)  Neutrophil Count, Fluid Latest Ref Range: 0 - 25 % 3    OV 06/28/2021 - week  37  Subjective:  Patient ID: Lynn [name], female , DOB: 04/05/1956 , age 68 y.o. , MRN: [id number] , ADDRESS: [address] [place] [address] PCP Lynn [name] Patient Care Team: Lynn [name] as PCP - General (Family Medicine) Lynn [name] as PCP - Cardiology (Cardiology)  This Provider for this visit: Treatment Team:  Attending Provider: Geronimo Amel, [name]    06/28/2021 -   Chief Complaint  Patient presents with   Follow-up    Pt states she has been doing okay since last visit. States her breathing is about the same. States she has occ chest pains.   68-year-o  She is known to have moderate to severe mitral stenosis on TEE June 2022.  Also morbid obesity  HPI Lynn [name] 68 y.o. -returns for follow-up.  At this point in time she is supposed to be done with prednisone  but for some reason she still on prednisone  although next week should be done with it.  She was discharged on 4 L nasal cannula.  She continues to use oxygen  at 3 L nasal cannula at rest and with exertion.  Her current symptom score is below.  Overall she does feel improved compared to when she was in the hospital although it is unclear whether she is improved compared to 1 month ago.  She does still feel some chest tightness and still has some cough.  Her current symptom score and walk test is below.Here walking desaturation test and got severely symptomatic but did not desaturate.  This is an improvement.  She has upcoming appoint with Dr. Alm Lynn and cardiology for mitral stenosis.    CT Chest data HRCT 06/23/21   Narrative & Impression  CLINICAL DATA:  68 year old female with history of shortness of breath with exertion. Evaluate for interstitial lung disease.   EXAM: CT CHEST WITHOUT CONTRAST   TECHNIQUE: Multidetector CT imaging of the chest was performed following the standard protocol without intravenous contrast. High  resolution imaging of the lungs, as well as inspiratory and expiratory imaging, was performed.   COMPARISON:  Chest CT 03/30/2021.   FINDINGS: Cardiovascular: Heart size is normal. There is no significant pericardial fluid, thickening or pericardial calcification. There is aortic atherosclerosis, as well as atherosclerosis of the great vessels of the mediastinum and the coronary arteries, including calcified atherosclerotic plaque in the left main, left anterior descending, left circumflex and right coronary arteries. Severe calcifications of the mitral valve and annulus.   Mediastinum/Nodes: No pathologically enlarged mediastinal or hilar lymph nodes. Please note that accurate exclusion of hilar adenopathy is limited on noncontrast CT scans. Esophagus is unremarkable in appearance. No axillary lymphadenopathy.   Lungs/Pleura: When compared to prior examination from 03/30/2021, much of the previously noted parenchymal lung changes have resolved. There continues to be some patchy areas of ground-glass attenuation, septal thickening and mild subpleural reticulation scattered throughout the lungs bilaterally, with no discernible craniocaudal gradient. No traction bronchiectasis or frank honeycombing. Inspiratory and expiratory imaging demonstrates some mild air trapping indicative of mild small airways disease. No acute consolidative airspace disease. No pleural effusions. No definite suspicious appearing pulmonary nodules or masses are noted.   Upper Abdomen: Aortic atherosclerosis.   Musculoskeletal: There are no aggressive appearing lytic or blastic lesions noted in the visualized portions of the  skeleton.   IMPRESSION: 1. The appearance of the lungs is compatible with interstitial lung disease, but the spectrum of findings is most indicative of an alternative diagnosis (not usual interstitial pneumonia) per current ATS guidelines. Specifically, findings are favored to reflect  mild post infectious or inflammatory fibrosis, and are significantly improved compared to the prior examination. 2. Aortic atherosclerosis, in addition to left main and 3 vessel coronary artery disease. Please note that although the presence of coronary artery calcium  documents the presence of coronary artery disease, the severity of this disease and any potential stenosis cannot be assessed on this non-gated CT examination. Assessment for potential risk factor modification, dietary therapy or pharmacologic therapy may be warranted, if clinically indicated. 3. There are severe bulky calcifications of the mitral valve and annulus. Echocardiographic correlation for evaluation of potential valvular dysfunction may be warranted if clinically indicated.   Aortic Atherosclerosis (ICD10-I70.0).     Electronically Signed   By: Toribio Aye M.D.   On: 06/24/2021 08:23          06/28/2021: OV with Dr. Geronimo. She was supposed to be done with prednisone  taper but for some reason, still on it. Should complete next week. She was discharged on 4 lpm supplemental oxygen . Continues to use 3 lpm at rest and exertion. Feels improved compared to when she was in the hospital. Still has chest tightness and still has some cough. She did not have any desaturations on her walk test but did become severely symptomatic. Has known moderate to severe MS. Clinically better and CT with significant improvement; perhaps some residual scar tissue. Suggest to no longer use oxygen  with rest or only walking short distances. Check ONO on room air. Advised to check with Dr. Anner to see if ok to do SOB.  10/25/2022: OV with Cobb NP for overdue follow up. She was encouraged by her PCP to come back to see us . Since she was here last, she was hospitalized in November 2023 for anemia related to GI bleed. Feeling better since discharge but hgb has not entirely normalized so she still has some residual fatigue. As far as  her breathing goes, it's better since her hospitalization but she feels it is unchanged compared to when she was here last. She gets winded climbing stairs or with uphill walking. She is able to complete ADLs without difficulties. She has an occasional cough, which is sometimes productive with clear to white sputum. She uses her nebulizer treatments daily, which seem to help her clear sputum and decrease chest congestion. She also notices that her cough gets worse if she has swelling so she makes sure to take her lasix  when this happens, which helps. Still uses her oxygen  with activity and at night. She monitors her oxygen  levels at home and is never below 92% on room air. Denies any night sweats, fevers, chills, wheezing, leg swelling, orthopnea, PND. She has RA on plaquenil . She is also on Eliquis  for PAF; no further episodes of melena since her hospital stay.   She does have a CPAP, which she wore nightly but it recently broke. She was diagnosed with sleep apnea around 10-12 years ago. Has been on CPAP since. Receives good benefit from use. She's not sure what her settings were set on. She has noticed that since she hasn't been able to use it, she's been waking up more tired and doesn't feel as rested. Denies drowsy driving or morning headaches.    12/14/2022: Today - follow up  16-year-old female,  former smoker (11 pack history) followed for pneumonitis and DOE. She is a patient of Dr. Reeves and last seen in office 10/25/2022 by Malachy NP. Past medical history significant for rheumatoid arthritis previously on methotrexate, HTN, HFpEF, DM II, chronic anemia thalassemia.   She was last seen in 2022 after being admitted in June to the hospital for acute respiratory failure secondary to pneumonitis, felt to be methotrexate or feather pill hypersenstivity after undergoing bronchoscopy with predominant lymphocytosis greater than 60% on BAL. She was treated with high dose steroids and bactrim  for PJP  prophylaxis. Imaging improved so she was tapered off by September 2022.  Patient presents today for follow up after HRCT chest and PFTs. Her last set of PFTs were prior to her hospitalization for pneumonitis. She has had a significant decline in her lung function with restrictive defect when compared to 2019. Her DLCO is stable from 70% to 67%; although, this was not corrected for current hgb (last draw 08/2022 with hgb 9.7). Her HRCT was stable with mild changes and no evidence of progression. Findings were suggestive of HP and not UIP pattern. She tells me today that she's noticed for the past month, she's felt a little more short winded with activity and feels like she gets fatigued more easily. She has a cough, which is relatively unchanged from baseline, and productive with clear to white phlegm. Occasionally feels like her chest is congestion, which improves with saline neb. Doesn't notice any wheezing. She denies any fevers, night sweats, hemoptysis, low oxygen  levels, leg swelling, orthopnea, palpitations. Albuterol  inhaler does help some. She has not missed any doses of her Eliquis .   She is still using her CPAP machine at night. Humidification doesn't seem to be working properly. She wants a new machine. Does not have her previous sleep study. She doesn't have a SD card in her machine. DME stated that she needs a new sleep study but she has yet to have this scheduled.   TEST/EVENTS:  01/14/2018 PFT: FVC 77, FEV1 84, ratio 87, TLC 75, DLCO 70.  No BD 06/23/2021 HRCT chest: Atherosclerosis.  Severe calcifications of mitral valve.  Much of the previously noted parenchymal lung changes have resolved.  Continues to be some patchy areas of groundglass attenuation, septal thickening and mild subpleural reticulation scattered throughout the lungs bilaterally.  No discernible craniocaudal gradient.  No traction bronchiectasis or frank honeycombing.  Favored to reflect a mild postinfectious or inflammatory  fibrosis, significantly improved compared to prior exam.  Some mild air trapping indicative of mild small airways disease.  11/23/2022 HRCT chest: Mild areas of subpleural reticulation with minimal associated traction bronchiectasis and no clear craniocaudal gradient.  Associated air trapping suggestive of hypersensitivity pneumonitis.  No evidence of progression when compared with previous CT.  Not UIP.  CAD and atherosclerosis. 12/14/2022 PFT: FVC 42, FEV1 46, TLC 58, DLCOunc 67  OV 10/31/2023  Subjective:  Patient ID: Lynn [name], female , DOB: Sep 01, 1956 , age 83 y.o. , MRN: [id number] , ADDRESS: [address] [place] [address] PCP Lynn [name] Patient Care Team: Lynn [name] as PCP - General (Family Medicine) Lynn [name] as PCP - Cardiology (Cardiology) Duke, Jon Garre, PA as Physician Assistant (Cardiology)  This Provider for this visit: Treatment Team:  Attending Provider: Geronimo Amel, [name]    10/31/2023 -   Chief Complaint  Patient presents with   Follow-up    Pft f/u, pt states her breathing has been up  and down. Coughing and oxygen  dependent. Using inhalers but more concerns about her cough     HPI Lynn [name] 68 y.o. -returns for follow-up.  I personally not seen in 3 years.  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  She says the breathing itself is stable [symptom scores are stable].  She started Wegovy  for weight loss but she says she has not lost any weight.  She continues to use oxygen  as needed although with exercise hypoxemia test she did not desaturate although she showed a tendency to drop.  Her main issues in the interim have been 3 episodes of upper GI bleed.  She says that with a cardiac valve she is not a surgical candidate but desires a second opinion.  I did indicate to her that she should talk Dr. Anner about second opinion potentially at [facility].  For  rheumatoid arthritis she is not taking any medications including Plaquenil .  She sees Rosaline Salt.  Currently pain-free from rheumatoid arthritis.  Current symptom scores are below.  OV 08/13/2024  Subjective:  Patient ID: Lynn [name], female , DOB: 1956/04/02 , age 19 y.o. , MRN: [id number] , ADDRESS: [address] [place] [address] PCP Lynn [name] Patient Care Team: Lynn [name] as PCP - General (Family Medicine) Lynn [name] as PCP - Cardiology (Cardiology) Duke, Jon Garre, PA as Physician Assistant (Cardiology)  This Provider for this visit: Treatment Team:  Attending Provider: Geronimo Amel, [name]   #Interstitial lung disease with features of hypersensitive pneumonitis but in the setting of rheumatoid arthritis =--on supportive care  - ILD with rheumatoid arthritis on methotrexate.  Admitted June 2022 for acute hypoxemic infiltrates respiratory failure with groundglass opacities.  BAL positive for greater than 60% lymphocytes.  Differential diagnose of methotrexate related lung injury versus feather pillow hypersensitive pneumonitis.  Started on 10-week prednisone  taper at discharge along with Bactrim .  #Associated sleep apnea  #Moderate to severe mitral stenosis followed by Dr. Alm Lynn  - Right heart catheterization 08/08/2021 with pulmonary venous hypertension; PA mean 31, PCWP without significant elevation, LVEDP 23, cardiac index 3  - Second opinion [facility] January 16, 2024 with Dr. Dallas Door showed moderate mitral stenosis with pulm systolic pressure 49 mmHg.  Saw Dr. Nyla thoracic surgeon 01/16/2024 felt to be in moderate to severe mitral stenosis class II heart failure symptoms I recommended ongoing medical care.  # History of syncope 2023 Zio patch without any significant arrhythmias to explain syncope  - Obesity  08/13/2024 -   Chief Complaint  Patient presents with   Medical Management of  Chronic Issues   Interstitial Lung Disease    Breathing has been stable but her sats have been lower than usual.      HPI Lynn [name] 68 y.o. -atricia Dianne Annalicia Renfrew is a 68 year old female with atrial fibrillation and rheumatoid arthritis who presents with worsening shortness of breath.  She is known to have ILD.  A lot of lymphocytes.  Since the early part of the year, she has experienced worsening shortness of breath, necessitating increased use of supplemental oxygen . Her oxygen  saturation levels have dropped to as low as 79% even while at rest, and it takes time for her levels to improve after using supplemental oxygen . She also experiences episodes of rapid heartbeat. No hospitalizations, emergency room visits, surgeries, or urgent care visits have occurred this year.  CT scan personally visualized  the reporting possible CTEPH monte is on Eliquis  though.  She has mitral stenosis.  This could easily be worsening ILD.  Last right heart cath was in 2022.    She has a history of atrial fibrillation and is currently on Eliquis .   She has mitral stenosis April 2025 she visited [address].  Considered high risk surgery.  External records reviewed.  Additionally, she has rheumatoid arthritis but has not visited her rheumatologist recently.   Concerns about anesthesia arose after a previous issue during an upper GI procedure last year, leading to hesitancy about undergoing a colonoscopy.  She also has sleep apnea but does not currently use a CPAP machine, resulting in poor sleep quality.    SYMPTOM SCALE - ILD 06/28/2021  12/14/2022 10/31/2023  08/13/2024 91% RA at res but uses 3 L.  Current weight 251#  242 239#   O2 use 3LNC      Shortness of Breath 0 -> 5 scale with 5 being worst (score 6 If unable to do)      At rest 3  3 4 3   Simple tasks - showers, clothes change, eating, shaving 4  5 3 4   Household (dishes, doing bed, laundry) 4  5 4 4   Shopping 4  5 1 3    Walking level at own pace 4  4 3 4   Walking up Stairs 4  5 4 3   Total (30-36) Dyspnea Score 23   19 21   How bad is your cough? 3  3 4 2   How bad is your fatigue Did not answer  4 1   How bad is nausea 0  0 0 0  How bad is vomiting?   0  0 0 0  How bad is diarrhea? 2  0 0 0  How bad is anxiety? 4  4 5 4   How bad is depression 3  3 0 1  Any chronic pain - if so where and how bad Did nto sak   0 0     Simple office walk 185 feet x  3 laps goal with forehead probe 06/28/2021    10/25/2022 10/31/2023   O2 used ra  ra ra  Number laps completed Didonly 2 of 3  3/3   Comments about pace slow  slow Sit stad  1596% and HR 83  Resting Pulse Ox/HR 100% and 77/min  96%, 78/min 92% and HR 94  Final Pulse Ox/HR 98% and 99/min  96%, 115/min   Desaturated </= 88% no  no   Desaturated <= 3% points no  no   Got Tachycardic >/= 90/min yes  yes   Symptoms at end of test Modreate tos evere shortness of breath  mild SOB   Miscellaneous comments               CT Chest data from date: sept 2025  - personally visualized and independently interpreted : ues - my findings are: I might or might not agree with this.   IMPRESSION: Diffuse mosaic ground-glass attenuation on inspiratory and expiratory phase with associated pulmonary artery enlargement suggestive of chronic pulmonary thromboembolism (CTEPH), progressed to 2022. Differentials include chronic hypersensitive pneumonitis. Some interstitial fibrotic components are identified in posteromedial right lower lobe and anterior right bilateral upper lobes.   Stable calcified large mass/calcification at the level of mitral leaflets.   Few scattered pulmonary micro nodules, some are new to prior. Follow-up according to Fleischner guidelines below.   Atherosclerotic calcifications of coronary arteries.  No follow-up needed if patient is low-risk (and has no known or suspected primary neoplasm). Non-contrast chest CT can be considered in 12  months if patient is high-risk. This recommendation follows the consensus statement: Guidelines for Management of Incidental Pulmonary Nodules Detected on CT Images: From the Fleischner Society 2017; Radiology 2017; 284:228-243.     Electronically Signed   By: Megan  Zare M.D.   On: 06/25/2024 18:15   PFT     Latest Ref Rng & Units 10/31/2023   12:50 PM 12/14/2022   10:02 AM 01/14/2018   10:59 AM  PFT Results  FVC-Pre L 1.58  1.21  1.85   FVC-Predicted Pre % 55  42  77   FVC-Post L   1.87   FVC-Predicted Post %   78   Pre FEV1/FVC % % 80  84  85   Post FEV1/FCV % %   87   FEV1-Pre L 1.26  1.02  1.57   FEV1-Predicted Pre % 58  46  84   FEV1-Post L   1.62   DLCO uncorrected ml/min/mmHg 12.91  12.49  15.77   DLCO UNC% % 69  67  70   DLCO corrected ml/min/mmHg 12.91  12.49    DLCO COR %Predicted % 69  67    DLVA Predicted % 100  102  101   TLC L  2.81  3.66   TLC % Predicted %  58  75   RV % Predicted %  -307  81        LAB RESULTS last 96 hours No results found.       has a past medical history of Allergy (2018), Anxiety, CHF (congestive heart failure) (HCC), Clotting disorder, COPD (chronic obstructive pulmonary disease) (HCC), Coronary artery disease, non-occlusive, Coronary vasospasm, Depression, Diastolic dysfunction, Eye hemorrhage (01/2015), Gastric ulcer, GERD (gastroesophageal reflux disease), Headache, Heart murmur, History of blood transfusion, History of stomach ulcers, Hyperlipidemia, Hypertension, Microcytic anemia, Moderate mitral stenosis by prior echocardiogram (03/2021), Myocardial infarction (HCC) (2023), OSA on CPAP, Oxygen  deficiency, PAF (paroxysmal atrial fibrillation) (HCC) (03/2021), [name] (paroxysmal atrial tachycardia), Pneumonia, PVC's (premature ventricular contractions), Rheumatoid arthritis (HCC), Sickle cell anemia (HCC), Sleep apnea, Stroke (HCC), Syncope (07/03/2017 X 2), Thalassemia, and Type II diabetes mellitus (HCC).   reports that she quit  smoking about 9 years ago. Her smoking use included cigarettes. She started smoking about 53 years ago. She has a 11 pack-year smoking history. She has never used smokeless tobacco.  Past Surgical History:  Procedure Laterality Date   48-Hour Monitor  03/18/2017   Sinus rhythm with sinus tachycardia (rate 58-134 BPM)multiple PVCs noted with couplets and bigeminy. One triplet. 7 runs of [name] ranging from 100-130 bpm. Longest was 33 beats.   BALLOON ENTEROSCOPY N/A 08/13/2023   Procedure: BALLOON ENTEROSCOPY;  Surgeon: San Sandor GAILS, DO;  Location: [facility];  Service: Gastroenterology;  Laterality: N/A;   BIOPSY  04/03/2021   Procedure: BIOPSY;  Surgeon: Leigh Elspeth SQUIBB, [name];  Location: [facility];  Service: Gastroenterology;;   BIOPSY  12/17/2022   Procedure: BIOPSY;  Surgeon: Abran Norleen SAILOR, [name];  Location: [facility];  Service: Gastroenterology;;   BREAST BIOPSY Left    BRONCHIAL WASHINGS  03/28/2021   Procedure: BRONCHIAL WASHINGS;  Surgeon: Gretta Leita SQUIBB, DO;  Location: [facility];  Service: Endoscopy;;   CARDIAC CATHETERIZATION N/A 11/19/2016   Procedure: Left Heart Cath and Coronary Angiography;  Surgeon: Victory LELON Sharps, [name];  Location: [facility] INVASIVE CV LAB;  Service: Cardiovascular.  LM nl, LAD 40p, 50md, D1/2 small, LCX 40ost, OM2/3 nl/small, RCA 35 ost/mid, RPDA/RPL small/nl.   CARDIAC MRI  03/30/2021   Normal LVEF ~53%. 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 No Scar or Late Gadolinium Enhancement on LV Myocardium.   CARPAL TUNNEL RELEASE Right 11/01/2014   COLONOSCOPY     DILATION AND CURETTAGE OF UTERUS     ECTOPIC PREGNANCY SURGERY  X 2   ENTEROSCOPY N/A 09/05/2022   Procedure: ENTEROSCOPY;  Surgeon: Wilhelmenia Aloha Raddle., [name];  Location: [facility];  Service: Gastroenterology;  Laterality: N/A;   ENTEROSCOPY N/A 07/17/2023   Procedure: ENTEROSCOPY;  Surgeon: Abran Norleen SAILOR, [name];  Location: [facility];  Service:  Gastroenterology;  Laterality: N/A;   ESOPHAGOGASTRODUODENOSCOPY N/A 12/17/2022   Procedure: ESOPHAGOGASTRODUODENOSCOPY (EGD);  Surgeon: Abran Norleen SAILOR, [name];  Location: [facility];  Service: Gastroenterology;  Laterality: N/A;   ESOPHAGOGASTRODUODENOSCOPY (EGD) WITH PROPOFOL  N/A 04/03/2021   Procedure: ESOPHAGOGASTRODUODENOSCOPY (EGD) WITH PROPOFOL ;  Surgeon: Leigh Elspeth SQUIBB, [name];  Location: [facility];  Service: Gastroenterology;  Laterality: N/A;   ESOPHAGOGASTRODUODENOSCOPY (EGD) WITH PROPOFOL  N/A 08/15/2021   Procedure: ESOPHAGOGASTRODUODENOSCOPY (EGD) WITH PROPOFOL ;  Surgeon: Aneita Gwendlyn DASEN, [name];  Location: [facility];  Service: Endoscopy;  Laterality: N/A;   GIVENS CAPSULE STUDY N/A 07/17/2023   Procedure: GIVENS CAPSULE STUDY;  Surgeon: Abran Norleen SAILOR, [name];  Location: [facility];  Service: Gastroenterology;  Laterality: N/A;   HOT HEMOSTASIS N/A 09/05/2022   Procedure: HOT HEMOSTASIS (ARGON PLASMA COAGULATION/BICAP);  Surgeon: Wilhelmenia Aloha Raddle., [name];  Location: [facility];  Service: Gastroenterology;  Laterality: N/A;   HOT HEMOSTASIS N/A 08/13/2023   Procedure: HOT HEMOSTASIS (ARGON PLASMA COAGULATION/BICAP);  Surgeon: San Sandor GAILS, DO;  Location: [facility];  Service: Gastroenterology;  Laterality: N/A;   JOINT REPLACEMENT     KNEE ARTHROSCOPY Left 11/2014   RIGHT/LEFT HEART CATH AND CORONARY ANGIOGRAPHY N/A 08/08/2021   Procedure: RIGHT/LEFT HEART CATH AND CORONARY ANGIOGRAPHY;  Surgeon: Lynn [name];  Location: [facility] INVASIVE CV LAB;  Service: Cardiovascular;Ost LCx 40%. Ost-Prox RCA 35%. Prox-Mid LAD 25%-<25% D2. Mod-Severely Elevated LVEDP. Mild PA HTN -> PA mean 31 mmHg with a PCWP and LVEDP of 23 mmHg   SUBMUCOSAL TATTOO INJECTION  09/05/2022   Procedure: SUBMUCOSAL TATTOO INJECTION;  Surgeon: Wilhelmenia Aloha Raddle., [name];  Location: [facility];  Service: Gastroenterology;;   SUBMUCOSAL TATTOO INJECTION  07/17/2023   Procedure: SUBMUCOSAL TATTOO INJECTION;   Surgeon: Abran Norleen SAILOR, [name];  Location: [facility];  Service: Gastroenterology;;   TEE WITHOUT CARDIOVERSION N/A 03/28/2021   Procedure: TRANSESOPHAGEAL ECHOCARDIOGRAM (TEE);  Surgeon: Alveta Aleene PARAS, [name];  Location: [facility] ENDOSCOPY;; LVEF 55-60%. Normal LV Fxn & no RWMA. No LAA thrombus. Gr II Ao Plaque. Large circumscribed calcific mass (1.8 x 1.4 cm) anterior to posterior annulus.  Fixed posterior leaflet with Mod-Severe MS (Mean MVG ~14 mmHg, Peak 20 mmhg).  Mild MR   TONSILLECTOMY     TOTAL KNEE ARTHROPLASTY Right 02/18/2015   Procedure: RIGHT TOTAL KNEE ARTHROPLASTY;  Surgeon: Marcey Her, [name];  Location: [facility] OR;  Service: Orthopedics;  Laterality: Right;   TOTAL KNEE ARTHROPLASTY Left 08/19/2015   Procedure: LEFT TOTAL KNEE ARTHROPLASTY;  Surgeon: Marcey Her, [name];  Location: [facility] OR;  Service: Orthopedics;  Laterality: Left;   TRANSTHORACIC ECHOCARDIOGRAM  11/2016; 07/2017:   a) normal LV size, thickness and function. EF 55-60%. GR 1 DD.No RWMA severe mitral annular calcification, but no mitral stenosis. Mild LA dilation;; b) normal LV size  and function.  EF 65-75%.  Unable to assess diastolic function.  Aortic sclerosis with no stenosis.  Moderate mitral stenosis? (Gradient 9 mmHg) -?  Mild-mod left atrial enlargement    TRANSTHORACIC ECHOCARDIOGRAM  01/2019   EF 65 to 70%.  Normal function.  Elevated LVEDP-GR 1 DD.  Normal RV size and function.  Large calcific mass on posterior mitral leaflet mild to moderate mitral stenosis.   TRANSTHORACIC ECHOCARDIOGRAM  03/24/2021   EF 60 to 65%.  LV function with normal wall motion.  Moderate basal septal LVH.  GRII DD & Mild LA dilation.-> elevated LVEDP.  Normal RV function.  Mildly elevated PAP.  Ill-defined density measuring 3.23 x 2.1 cm region of the posterior MV leaflet along with dense MAC.  Moderate MS (mean MVG of 9 mmHg.) Normal AoV & RAP. --> Recommend TEE 2/2 concern for MV SBE.   TRANSTHORACIC ECHOCARDIOGRAM  04/26/2022   EF 60 to 65%.  No  RWMA.  Moderate LVH with GR 1 DD.  Normal RV size and function.  Mild LA dilation.  Large calcific mass of posterior MV leaflet.  Mean PG 13 mm 3.  Trivial MR.  Mild to moderate MS.  Severe MAC.  Normal AOV.  Normal RAP.   VAGINAL HYSTERECTOMY     VIDEO BRONCHOSCOPY N/A 03/28/2021   Procedure: VIDEO BRONCHOSCOPY WITHOUT FLUORO;  Surgeon: Gretta Leita SQUIBB, DO;  Location: [facility] ENDOSCOPY;  Service: Endoscopy;  Laterality: N/A;    Allergies  Allergen Reactions   Methotrexate Other (See Comments)    Pneumonitis   Penicillins Hives    Childhood allergy Has patient had a PCN reaction causing immediate rash, facial/tongue/throat swelling, SOB or lightheadedness with hypotension: Yes Has patient had a PCN reaction causing severe rash involving mucus membranes or skin necrosis: No Has patient had a PCN reaction that required hospitalization No Has patient had a PCN reaction occurring within the last 10 years: No If all of the above answers are NO, then may proceed with Cephalosporin use.   Farxiga  [Dapagliflozin ] Other (See Comments)    Dizzy and lethargic    Metformin  And Related Diarrhea and Other (See Comments)    Also, bleeding    Immunization History  Administered Date(s) Administered   Fluad Quad(high Dose 65+) 07/19/2021   Fluad Trivalent(High Dose 65+) 07/17/2023   INFLUENZA, HIGH DOSE SEASONAL PF 06/10/2019, 06/17/2024   Influenza Split 07/22/2012   Influenza,inj,Quad PF,6+ Mos 10/09/2014, 07/13/2015, 11/17/2016, 07/04/2017, 07/10/2018   Influenza-Unspecified 06/20/2020, 07/14/2022   Moderna Covid-19 Fall Seasonal Vaccine 8yrs & older 08/02/2023   Moderna Sars-Covid-2 Vaccination 11/27/2019, 12/26/2019, 06/22/2020, 12/30/2020, 06/21/2021   Pneumococcal Conjugate-13 03/23/2019   Pneumococcal Polysaccharide-23 08/22/2020   Pneumococcal-Unspecified 02/12/2010   Tdap 02/12/2010, 10/17/2016   Zoster Recombinant(Shingrix ) 02/20/2021    Family History  Problem Relation Age of Onset    Cancer Mother    Diabetes Mother    Hypertension Mother    Hyperlipidemia Mother    Cancer Father    Hyperlipidemia Father    Mental illness Sister    Diabetes Sister    Diabetes Maternal Grandmother    Diabetes Maternal Grandfather    Colon cancer Neg Hx    Esophageal cancer Neg Hx    Stomach cancer Neg Hx    Rectal cancer Neg Hx    Tremor Neg Hx    Parkinson's disease Neg Hx      Current Outpatient Medications:    acetaminophen  (TYLENOL ) 500 MG tablet, Take 1,000 mg by mouth every 6 (six) hours  as needed (for pain)., Disp: , Rfl:    albuterol  (PROVENTIL ) (2.5 MG/3ML) 0.083% nebulizer solution, INHALE 3 ML BY NEBULIZATION EVERY 6 HOURS AS NEEDED FOR WHEEZING OR SHORTNESS OF BREATH, Disp: 150 mL, Rfl: 7   albuterol  (VENTOLIN  HFA) 108 (90 Base) MCG/ACT inhaler, TAKE 2 PUFFS BY MOUTH EVERY 6 HOURS AS NEEDED FOR WHEEZE OR SHORTNESS OF BREATH, Disp: 8.5 each, Rfl: 2   ALPRAZolam  (XANAX ) 0.25 MG tablet, Take 1 tablet (0.25 mg total) by mouth 2 (two) times daily as needed for anxiety. TAKE 1-2 TABLETS BY MOUTH 2 TIMES DAILY AS NEEDED FOR ANXIETY OR INSOMNIA, Disp: 60 tablet, Rfl: 1   apixaban  (ELIQUIS ) 5 MG TABS tablet, Take 1 tablet (5 mg total) by mouth 2 (two) times daily., Disp: 180 tablet, Rfl: 3   atorvastatin  (LIPITOR ) 80 MG tablet, Take 1 tablet (80 mg total) by mouth every evening., Disp: 90 tablet, Rfl: 3   cetirizine (ZYRTEC) 10 MG tablet, Take 10 mg by mouth daily as needed for allergies., Disp: , Rfl:    Continuous Glucose Sensor (DEXCOM G7 SENSOR) MISC, 1 Device by Does not apply route as directed., Disp: 9 each, Rfl: 3   diltiazem  (CARDIZEM  CD) 360 MG 24 hr capsule, TAKE 1 CAPSULE BY MOUTH EVERY DAY, Disp: 90 capsule, Rfl: 1   folic acid  (FOLVITE ) 1 MG tablet, Take 2 tablets (2 mg total) by mouth daily., Disp: 180 tablet, Rfl: 3   furosemide  (LASIX ) 40 MG tablet, Take 40 mg by mouth in the morning and an additional 40 mg for an overnight weight gain of 3 pounds or 5 pounds  in a week, Disp: 135 tablet, Rfl: 3   glucose blood (CONTOUR NEXT TEST) test strip, 3x daily, Disp: 300 each, Rfl: 11   hydroxychloroquine  (PLAQUENIL ) 200 MG tablet, Take 200 mg by mouth 2 (two) times daily., Disp: , Rfl:    Insulin  Disposable Pump (OMNIPOD 5 G7 PODS, GEN 5,) MISC, 1 Device by Other route every other day., Disp: 45 each, Rfl: 3   insulin  lispro (HUMALOG ) 100 UNIT/ML injection, Max Daily 70 units per pump, Disp: 70 mL, Rfl: 3   isosorbide  mononitrate (IMDUR ) 30 MG 24 hr tablet, Take 1 tablet (30 mg total) by mouth daily., Disp: 90 tablet, Rfl: 3   Lancets 30G MISC, 1 Device by Does not apply route 3 (three) times daily., Disp: 300 each, Rfl: 9   losartan  (COZAAR ) 100 MG tablet, Take 0.5 tablets (50 mg total) by mouth daily., Disp: 45 tablet, Rfl: 3   Melatonin 5 MG CHEW, Chew 10 mg by mouth at bedtime., Disp: , Rfl:    Multiple Vitamin (MULTIVITAMIN WITH MINERALS) TABS tablet, Take 1 tablet by mouth daily with breakfast., Disp: , Rfl:    Oxymetazoline  HCl (VICKS SINEX NA), Place 1 spray into both nostrils every 12 (twelve) hours as needed (for congestion- MAX OF THREE CONSECUTIVE DAYS)., Disp: , Rfl:    pantoprazole  (PROTONIX ) 40 MG tablet, Take 1 tablet (40 mg total) by mouth daily before breakfast., Disp: 90 tablet, Rfl: 3   SAFETY-LOK TB SYRINGE 27GX.5 27G X 1/2 1 ML MISC, , Disp: , Rfl:    sodium chloride  (OCEAN) 0.65 % SOLN nasal spray, Place 1 spray into both nostrils as needed., Disp: 30 mL, Rfl: 5   spironolactone  (ALDACTONE ) 25 MG tablet, Take 1 tablet (25 mg total) by mouth daily., Disp: 90 tablet, Rfl: 3   tirzepatide  (MOUNJARO ) 15 MG/0.5ML Pen, Inject 15 mg into the skin once a week.,  Disp: 6 mL, Rfl: 3   traZODone  (DESYREL ) 50 MG tablet, Take 0.5-1 tablets (25-50 mg total) by mouth at bedtime as needed for sleep. Needs appt, Disp: 90 tablet, Rfl: 3      Objective:   Vitals:   08/13/24 1408  BP: 132/66  Pulse: 70  SpO2: 91%  Weight: 253 lb 3.2 oz (114.9 kg)   Height: 5' 3 (1.6 m)    Estimated body mass index is 44.85 kg/m as calculated from the following:   Height as of this encounter: 5' 3 (1.6 m).   Weight as of this encounter: 253 lb 3.2 oz (114.9 kg).  @WEIGHTCHANGE @  [employer]   08/13/24 1408  Weight: 253 lb 3.2 oz (114.9 kg)     Physical Exam   General: No distress. Sitting in scooter O2 at rest: yes but she was 91% RA atrest [place] present: no Sitting in wheel chair: no Frail: no Obese: YES Neuro: Alert and Oriented x 3. GCS 15. Speech normal Psych: Pleasant Resp:  Barrel Chest - no.  Wheeze - no, Crackles - no, No overt respiratory distress CVS: Normal heart sounds. Murmurs - no Ext: Stigmata of Connective Tissue Disease - no HEENT: Normal upper airway. PEERL +. No post nasal drip        Assessment/     Assessment & Plan ILD (interstitial lung disease) (HCC)  OSA (obstructive sleep apnea)    PLAN Patient Instructions  ILD (interstitial lung disease) (HCC) Hypersensitivity pneumonitis (HCC)  V RA  - Symptoms are worse and along with the c/o CT scan is worse. -Unclear if the CT scan is worse because of pulmonary infiltrates or edema   Plan  --Sending message to Dr. Anner to consider right heart catheterization - Further treatment based on right heart catheterization results  - If there is no significant pulmonary hypertension then we will consider immunomodulator or antifibrotic for worsening lung disease    OSA on CPAP  - not using cPAP - no sleep doctor  Plan  - per sleep doc;    Moderate mitral stenosis by prior echocardiography  - deemed high risk for surgery by [facility] April 2025  Plan   - per Dr Lynn;  - I have sent a message to Dr. Anner to consider right heart catheterization  Preoperative respiratory exam prior to colonoscopy  -Risk from pulmonary complications from colonoscopy related sedation and anesthesia can be lowered if pulmonary hypertension ruled  out  Plan - Recommending right heart catheterization   FOllowu; 6 weeks with nurse practitioner Dr. Geronimo 15-minute visit    FOLLOWUP    Return in about 6 weeks (around 09/24/2024) for Face to Face Visit, with Dr Lynn, with any of the APPS.  ( Level 05 visit E&M 2024: Estb >= 40 min  in  visit type: on-site physical face to visit  in total care time and counseling or/and coordination of care by this undersigned [name] - Dr Dorethia Lynn. This includes one or more of the following on this same day 08/13/2024: pre-charting, chart review, note writing, documentation discussion of test results, diagnostic or treatment recommendations, prognosis, risks and benefits of management options, instructions, education, compliance or risk-factor reduction. It excludes time spent by the CMA or office staff in the care of the patient. Actual time 40 min)   SIGNATURE    Dr. Dorethia Lynn, M.D., F.C.C.P,  Pulmonary and Critical Care Medicine Staff Physician, [facility] Director - Interstitial Lung Disease  Program  Pulmonary  Fibrosis [facility] at [facility] [place], [place], [address]  Pager: [phone], If no answer or between  15:00h - 7:00h: call [phone] Telephone: [phone]  2:38 PM 08/13/2024

## 2024-08-14 ENCOUNTER — Ambulatory Visit: Attending: Cardiology | Admitting: Cardiology

## 2024-08-14 VITALS — BP 144/65 | HR 78 | Ht 63.0 in | Wt 252.0 lb

## 2024-08-14 DIAGNOSIS — I050 Rheumatic mitral stenosis: Secondary | ICD-10-CM

## 2024-08-14 DIAGNOSIS — E785 Hyperlipidemia, unspecified: Secondary | ICD-10-CM

## 2024-08-14 DIAGNOSIS — I5032 Chronic diastolic (congestive) heart failure: Secondary | ICD-10-CM

## 2024-08-14 DIAGNOSIS — I4719 Other supraventricular tachycardia: Secondary | ICD-10-CM

## 2024-08-14 DIAGNOSIS — I272 Pulmonary hypertension, unspecified: Secondary | ICD-10-CM

## 2024-08-14 DIAGNOSIS — G4733 Obstructive sleep apnea (adult) (pediatric): Secondary | ICD-10-CM

## 2024-08-14 DIAGNOSIS — I2089 Other forms of angina pectoris: Secondary | ICD-10-CM | POA: Insufficient documentation

## 2024-08-14 DIAGNOSIS — K250 Acute gastric ulcer with hemorrhage: Secondary | ICD-10-CM

## 2024-08-14 DIAGNOSIS — J849 Interstitial pulmonary disease, unspecified: Secondary | ICD-10-CM

## 2024-08-14 DIAGNOSIS — I25119 Atherosclerotic heart disease of native coronary artery with unspecified angina pectoris: Secondary | ICD-10-CM | POA: Diagnosis not present

## 2024-08-14 DIAGNOSIS — E1169 Type 2 diabetes mellitus with other specified complication: Secondary | ICD-10-CM

## 2024-08-14 DIAGNOSIS — I2510 Atherosclerotic heart disease of native coronary artery without angina pectoris: Secondary | ICD-10-CM

## 2024-08-14 DIAGNOSIS — I10 Essential (primary) hypertension: Secondary | ICD-10-CM

## 2024-08-14 DIAGNOSIS — E66813 Obesity, class 3: Secondary | ICD-10-CM

## 2024-08-14 DIAGNOSIS — J9611 Chronic respiratory failure with hypoxia: Secondary | ICD-10-CM

## 2024-08-14 DIAGNOSIS — I201 Angina pectoris with documented spasm: Secondary | ICD-10-CM

## 2024-08-14 DIAGNOSIS — I480 Paroxysmal atrial fibrillation: Secondary | ICD-10-CM

## 2024-08-14 NOTE — Patient Instructions (Addendum)
 Medication Instructions:   No CHANGES  *If you need a refill on your cardiac medications before your next appointment, please call your pharmacy*   Lab Work: CBC BMP  If you have labs (blood work) drawn today and your tests are completely normal, you will receive your results only by: MyChart Message (if you have MyChart) OR A paper copy in the mail If you have any lab test that is abnormal or we need to change your treatment, we will call you to review the results.   Testing/Procedures:  Will schedule Your physician has requested that you have a cardiac catheterization. Cardiac catheterization is used to diagnose and/or treat various heart conditions. Doctors may recommend this procedure for a number of different reasons. The most common reason is to evaluate chest pain. Chest pain can be a symptom of coronary artery disease (CAD), and cardiac catheterization can show whether plaque is narrowing or blocking your heart's arteries. This procedure is also used to evaluate the valves, as well as measure the blood flow and oxygen  levels in different parts of your heart. For further information please visit [email or web address] Please follow instruction sheet, as given.   Follow-Up: At [facility], you and your health needs are our priority.  As part of our continuing mission to provide you with exceptional heart care, we have created designated Provider Care Teams.  These Care Teams include your primary Cardiologist (physician) and Advanced Practice Providers (APPs -  Physician Assistants and Nurse Practitioners) who all work together to provide you with the care you need, when you need it.     Your next appointment:   3 to 4 week(s)  The format for your next appointment:   In Person  Provider:   Alm Clay, MD or  App- Janell Mose NP or Damien Braver NP    Other Instructions    [facility] A DEPT OF [facility]ARTCARE AT [facility] A DEPT OF  THE [place] [address] [place] [address] Dept: [phone] Loc: [phone]  Lynn [name]  08/14/2024  You are scheduled for a Right  and Left Cardiac Catheterization on Wednesday, November 5 with Dr. Alm Clay.  1. Please arrive at the [facility] (Main Entrance A) at [facility]: [address] [place], [place] [address] at 11:00 AM (This time is 2 hour(s) before your procedure to ensure your preparation).   Free valet parking service is available. You will check in at ADMITTING. The support person will be asked to wait in the waiting room.  It is OK to have someone drop you off and come back when you are ready to be discharged.    Special note: Every effort is made to have your procedure done on time. Please understand that emergencies sometimes delay scheduled procedures.  2. Diet: Light meals may be eaten up to 6 hours before scheduled procedures from 12N and after; please stop eating at 8:30 AM   Light meal consist of plain toast, fruit, light soups, crackers.   3. Hydration: You need to be well hydrated before your procedure. On November 4, you may drink approved liquids (see below) until 2 hours before the procedure, with 16 oz of water as your last intake.   List of approved liquids water, clear juice, clear tea, black coffee, fruit juices, non-citric and without pulp, carbonated beverages, Gatorade, Kool -Aid, plain Jello-O and plain ice popsicles.  4. Labs: You will need to  have blood drawn on Friday, October 31 at [facility] (1st Floor), [address] [place], [place] [address]. You do not need to be fasting.  5. Medication instructions in preparation for your procedure:   Contrast Allergy: No    Stop taking, Spironolactone  25 mg  , Lasix  40 mg , Losartan  50 mg  Wednesday, November 5,  You  are wearing  Insulin  pump ---  take 1/2 amount  70 units the night before  Stop  Eliqius Sunday, November 2,  Do not  take Mounjaro   this  Sunday Aug 16, 2024 On the morning of your procedure, take your Aspirin  81 mg and any morning medicines NOT listed above.  You may use sips of water.  6. Plan to go home the same day, you will only stay overnight if medically necessary. 7. Bring a current list of your medications and current insurance cards. 8. You MUST have a responsible person to drive you home. 9. Someone MUST be with you the first 24 hours after you arrive home or your discharge will be delayed. 10. Please wear clothes that are easy to get on and off and wear slip-on shoes.  Thank you for allowing us  to care for you!   -- [facility]

## 2024-08-14 NOTE — Progress Notes (Signed)
 Cardiology Office Note:  .   Date:  08/15/2024  ID:  Lynn [name], DOB 05-09-1956, MRN [id number] PCP: Watt [name], MD  [facility] Providers Cardiologist:  Alm Clay, MD Cardiology APP:  Madie Jon Garre, [place]     Chief Complaint  Patient presents with   Follow-up    Still has dyspnea.  Pulmonologist (Dr. Geronimo recommended right heart cath based on chest CT)    Patient Profile: .     Lynn [name] is a morbidly obese 68 y.o. female former smoker with a PMH notable for nonobstructive CAD (possible coronary spasm), moderate calcific mitral stenosis, PAF (with history of GI bleed, PAD, HTN, HLD, DM type II, OSA, CKD 3, thalassemia, recently diagnosed ILD who presents here for 32-month follow-up at the request of Dr. Geronimo.  PMH Moderate/nonocclusive CAD Moderate Mitral Stenosis-Heavily Calcified => seen by [facility] (Dr. Laurence) recommendation for now is optimization of medical therapy. Echo and CTA showed heavily calcified posterior mitral leaflet/regurgitation.  Felt to be high risk for surgery based on history of stroke and pulmonary fibrosis Home O2. PAF ILD (pulmonary fibrosis) on home O2 DM-2 HTN HLD CKD 3 History of CVA RA     Lynn [name] was last seen on June 08, 2024 as a follow-up from her last visit with [facility].  She was noticing worsening edema in her legs and her weight was going up.  She was having worsening mobility issues due to her back pain.  We discussed her GI bleeding issues last year requiring hospitalization in October.  She noted intermittent chest pain and exertional dyspnea, worsening orthopnea edema dyspnea. => We had a spironolactone  25 mg and increased furosemide  to 40 daily.  Recommended monitoring fluid status.  She was noted to have labile blood pressure.  Hope was that increasing diuretic and adding spironolactone  was helpful but considered increasing ARB dose.  Subjective  Discussed the  use of AI scribe software for clinical note transcription with the patient, who gave verbal consent to proceed.  History of Present Illness Lynn [name] is a 68 year old female with coronary artery disease and pulmonary hypertension who presents with chest pain and shortness of breath. She was referred by Dr. Tonna for evaluation of possible pulmonary hypertension and underlying lung disease.  She experiences chest pain and shortness of breath, particularly with exertion. The chest pain is described as heart pain, located on the left side, and is accompanied by palpitations and fluttering, which have been lasting longer than before. Her heart rate increases with minimal exertion, such as walking to the bathroom, and has been recorded at 102 bpm during such activities.  She has a history of pulmonary hypertension and lung disease, and uses a scooter for mobility when going out. She cannot stand for long periods and uses four pillows to sleep due to shortness of breath when lying flat. She reports poor sleep quality and sometimes wakes up unable to breathe.  Her current medications include furosemide  40 mg, spironolactone , diltiazem  360 mg, losartan  50 mg, and isosorbide  mononitrate. She monitors her heart rate regularly and notes that it often exceeds 100 bpm with exertion.  She underwent a CT scan recently. She has a history of right and left heart catheterization performed a couple of years ago, which included pressure measurements and coronary evaluations.  Her swelling has improved with current medications. She has not yet started using a CPAP machine but wears oxygen . She experiences joint pain and  has a history of mitral stenosis and aortic valve sclerosis. Her last echocardiogram in February showed good pump function with an ejection fraction of 60-65% and moderate left atrial dilation.  Her cholesterol levels have improved significantly over the past year, with LDL reduced  from 131 to 46, total cholesterol at 118, HDL at 58, and triglycerides at 69.   Cardiovascular ROS: positive for - chest pain, dyspnea on exertion, orthopnea, palpitations, shortness of breath, and has not started CPAP.  Uses oxygen  at night higher setting negative for - edema or syncope or near syncope, TIA or amaurosis fugax.  Edema seems to improved.  ROS:  Review of Systems - Negative except symptoms noted above.  Most limited by back and hip pain as well as knee pain.  Is on a motorized scooter.    Objective   Current Meds  Medication Sig   acetaminophen  (TYLENOL ) 500 MG tablet Take 1,000 mg by mouth every 6 (six) hours as needed (for pain).   albuterol  (PROVENTIL ) (2.5 MG/3ML) 0.083% nebulizer solution INHALE 3 ML BY NEBULIZATION EVERY 6 HOURS AS NEEDED FOR WHEEZING OR SHORTNESS OF BREATH   albuterol  (VENTOLIN  HFA) 108 (90 Base) MCG/ACT inhaler TAKE 2 PUFFS BY MOUTH EVERY 6 HOURS AS NEEDED FOR WHEEZE OR SHORTNESS OF BREATH   ALPRAZolam  (XANAX ) 0.25 MG tablet Take 1 tablet (0.25 mg total) by mouth 2 (two) times daily as needed for anxiety. TAKE 1-2 TABLETS BY MOUTH 2 TIMES DAILY AS NEEDED FOR ANXIETY OR INSOMNIA   apixaban  (ELIQUIS ) 5 MG TABS tablet Take 1 tablet (5 mg total) by mouth 2 (two) times daily.   atorvastatin  (LIPITOR ) 80 MG tablet Take 1 tablet (80 mg total) by mouth every evening.   cetirizine (ZYRTEC) 10 MG tablet Take 10 mg by mouth daily as needed for allergies.   Continuous Glucose Sensor (DEXCOM G7 SENSOR) MISC 1 Device by Does not apply route as directed.   diltiazem  (CARDIZEM  CD) 360 MG 24 hr capsule TAKE 1 CAPSULE BY MOUTH EVERY DAY   folic acid  (FOLVITE ) 1 MG tablet Take 2 tablets (2 mg total) by mouth daily.   furosemide  (LASIX ) 40 MG tablet Take 40 mg by mouth in the morning and an additional 40 mg for an overnight weight gain of 3 pounds or 5 pounds in a week   glucose blood (CONTOUR NEXT TEST) test strip 3x daily   hydroxychloroquine  (PLAQUENIL ) 200 MG tablet  Take 200 mg by mouth 2 (two) times daily.   Insulin  Disposable Pump (OMNIPOD 5 G7 PODS, GEN 5,) MISC 1 Device by Other route every other day.   insulin  lispro (HUMALOG ) 100 UNIT/ML injection Max Daily 70 units per pump   isosorbide  mononitrate (IMDUR ) 30 MG 24 hr tablet Take 1 tablet (30 mg total) by mouth daily.   Lancets 30G MISC 1 Device by Does not apply route 3 (three) times daily.   losartan  (COZAAR ) 100 MG tablet Take 0.5 tablets (50 mg total) by mouth daily.   Melatonin 5 MG CHEW Chew 10 mg by mouth at bedtime.   Multiple Vitamin (MULTIVITAMIN WITH MINERALS) TABS tablet Take 1 tablet by mouth daily with breakfast.   Oxymetazoline  HCl (VICKS SINEX NA) Place 1 spray into both nostrils every 12 (twelve) hours as needed (for congestion- MAX OF THREE CONSECUTIVE DAYS).   pantoprazole  (PROTONIX ) 40 MG tablet Take 1 tablet (40 mg total) by mouth daily before breakfast.   SAFETY-LOK TB SYRINGE 27GX.5 27G X 1/2 1 ML MISC    sodium  chloride (OCEAN) 0.65 % SOLN nasal spray Place 1 spray into both nostrils as needed.   spironolactone  (ALDACTONE ) 25 MG tablet Take 1 tablet (25 mg total) by mouth daily.   tirzepatide  (MOUNJARO ) 15 MG/0.5ML Pen Inject 15 mg into the skin once a week.   traZODone  (DESYREL ) 50 MG tablet Take 0.5-1 tablets (25-50 mg total) by mouth at bedtime as needed for sleep. Needs appt     Studies Reviewed: SABRA        Lab Results  Component Value Date   CHOL 118 06/17/2024   HDL 58.10 06/17/2024   LDLCALC 46 06/17/2024   LDLDIRECT 131 (H) 09/02/2013   TRIG 69.0 06/17/2024   CHOLHDL 2 06/17/2024   Lab Results  Component Value Date   NA 137 08/14/2024   K 4.9 08/14/2024   CREATININE 1.23 (H) 08/14/2024   EGFR 47 (L) 08/14/2024   GLUCOSE 143 (H) 08/14/2024   Lab Results  Component Value Date   HGBA1C 6.3 (A) 07/22/2024   Lab Results  Component Value Date   TSH 2.280 11/14/2021   Results LABS Lipid panel: Total cholesterol 118, HDL 58, LDL 46, Triglycerides 69  (06/17/2024); BNP 132 (06/08/2024) Cr 1.16, K+ 1.16 (07/07/2024 A1c 6.3 (07/22/2024) Hgb 9.2 (07/29/2023)  RADIOLOGY Coronary CTA [facility]): Enlarged LA with extensive nodular mitral annular calcification and small size mitral annulus.  Normal-sized LV with basal septal hypertrophy.  Notable proximal coronary calcification resulting in scattered areas of 50% narrowing proximal LCx, mid LAD and more notably greater than 70% narrowing of the ostial D2.  (01/16/2024)  HR chest CT: Diffuse mosaic ground glass attenuation on inspiratory expiratory phase associated pulm arterial enlargement.  This suggests chronic pulmonary thromboembolism (CTEPH)-progressed from 2022.  Some interstitial fibrotic components verified posterior medial RLL and anterior right bilateral upper lobes.  Stable calcified large mass at the level of mitral leaflets.  Recommendation for right heart catheterization (08/13/2024)  DIAGNOSTIC Echocardiogram: Ejection fraction 60-65%, no wall motion abnormalities, grade 1 diastolic dysfunction, moderate left atrial dilation, mitral valve calcification, aortic valve sclerosis without stenosis (February 2024)  ECHO ([facility]): Normal LVEF> 55% (65 to 70%).  Mild LVH.  Elevated LAP-GR 2 DD.  Normal RV size and function.  Moderate MS.  No MR.  Dense/around posterior leaflet mitral annular calcification within the myocardium with MODERATE MITRAL GNOSIS.  MEAN MDD 11 mL mercury.  RVSP estimated 49 3.  (01/16/2024)  CATH: Dominance: RightOstial LCx 40%.  Ostial to proximal RCA 35%.  Proximal to mid LAD 25% with 25% on D2.  Mid-distal LAD 30%.  Severely elevated LVEDP with mild pulm hypertension.  Cardiac output-index 6.43-3.0.  Mean PAP was 31 mmHg with a LVEDP/PCWP of 23 mmHg.  (08/08/2021)   Risk Assessment/Calculations:    CHA2DS2-VASc Score = 8   This indicates a 10.8% annual risk of stroke. The patient's score is based upon: CHF History: 1 HTN History: 1 Diabetes History: 1 Stroke History:  2 Vascular Disease History: 1 Age Score: 1 Gender Score: 1           Physical Exam:   VS:  BP (!) 144/65   Pulse 78   Ht 5' 3 (1.6 m)   Wt 252 lb (114.3 kg)   LMP  (LMP Unknown)   SpO2 94%   BMI 44.64 kg/m    Wt Readings from Last 3 Encounters:  08/14/24 252 lb (114.3 kg)  08/13/24 253 lb 3.2 oz (114.9 kg)  07/22/24 250 lb (113.4 kg)  GEN: Morbidly obese.  Well groomed; sitting in motorized wheelchair.  No obvious distress. NECK: Unable to assess JVD; No carotid bruits CARDIAC: Distant heart sounds with normal S1 and S2.  No obvious M/R/G.  (Difficult to hear due to body habitus) RESPIRATORY: Nonlabored.  CTAB.  No W/R/R. ABDOMEN: Soft, non-tender, non-distended EXTREMITIES:  No edema; No deformity      ASSESSMENT AND PLAN: .    Problem List Items Addressed This Visit       Cardiology Problems   Acute gastric ulcer with hemorrhage   Assess hemoglobin level prior to catheterization.      Atypical angina   Chest pain symptoms occurring at rest and with exertion although she does not exert herself much.  Cannot exclude atypical angina based on her coronary artery disease. Continue Imdur  With plans for right heart catheterization to evaluate pulm hypertension and possible CTEPH, we will also proceed with left heart catheterization to exclude progression of CAD.SABRA      Relevant Orders   Basic metabolic panel with GFR (Completed)   CBC (Completed)   Chronic diastolic CHF (congestive heart failure) (HCC) (Chronic)   Chronic diastolic heart failure with improved swelling on current diuretic regimen.   - Continue current diuretic regimen with furosemide  1 mg daily and spironolactone  25 mg daily - Continue current dose of diltiazem  360 mg daily and losartan  100 mg daily with low threshold to consider adding bisoprolol since she still has some tachycardia spells.      Relevant Orders   Basic metabolic panel with GFR (Completed)   CBC (Completed)   Coronary  artery disease, non-occlusive (Chronic)   Elevated Coronary Calcium  Score with significant plaque burden and moderate 40 to 50% stenosis CT angiogram suggested 70% ostial D2 disease.  She has chest pain intermittently which could be angina from versus MS related pain.  Could also suspect microvascular disease.  Intermittent chest pain with suspected microvascular disease. Differential includes coronary artery disease and microvascular disease. Potential for small vessel disease not visible on CT or cath. - Perform right and left heart catheterization to assess for coronary artery disease and microvascular disease. - Measure coronary flow reserve during catheterization to evaluate for small vessel disease. - Consider medication adjustments based on catheterization results.      Relevant Orders   Basic metabolic panel with GFR (Completed)   CBC (Completed)   Coronary artery spasm - Primary (Chronic)   Due to concern for possible spasms she is on Imdur  30 mg daily      Relevant Orders   Basic metabolic panel with GFR (Completed)   CBC (Completed)   Essential hypertension (Chronic)   Blood pressure slightly elevated today, possibly due to exertion. - Monitor blood pressure during upcoming procedures. For now continue current doses of losartan  100 daily, diltiazem  XT 360 mg daily along with spironolactone  25 mg daily, furosemide  40 daily and Imdur  30 daily.  Would consider bisoprolol plus or minus hydralazine  for additional blood pressure control.      Relevant Orders   Basic metabolic panel with GFR (Completed)   CBC (Completed)   Moderate mitral stenosis by prior echocardiography (Chronic)   Mitral stenosis with moderate left atrial dilation and large calcification on the mitral valve. No surgical options available due to the extent of calcification. - Order echocardiogram to assess mitral stenosis progression.      Relevant Orders   Basic metabolic panel with GFR (Completed)   CBC  (Completed)   PAF (paroxysmal atrial fibrillation) (HCC);  CHA2DS2-VASc score 7 (Chronic)   Not sure if he is having breakthrough spells of A-fib versus PAT.  Remains on Eliquis  and diltiazem  360 mg daily.. Will consider adding bisoprolol for additional rate control.  Continue Eliquis .      Relevant Orders   Basic metabolic panel with GFR (Completed)   CBC (Completed)   PAT (paroxysmal atrial tachycardia) (Chronic)   Atrial arrhythmia (palpitations, possible paroxysmal atrial fibrillation) Palpitations with possible paroxysmal atrial fibrillation. No confirmed episodes of atrial fibrillation. - Order ambulatory heart monitor to assess for atrial fibrillation. - Consider bisoprolol if monitor shows concerning findings.      Relevant Orders   Basic metabolic panel with GFR (Completed)   CBC (Completed)   Pulmonary hypertension, unspecified (HCC)   Suspected pulmonary hypertension contributing to shortness of breath and chest pain.  CT scan suggested CTEPH along with existing obesity, OSA at ILD - Perform right heart catheterization to assess pulmonary pressures.      Relevant Orders   Basic metabolic panel with GFR (Completed)   CBC (Completed)   Type 2 diabetes mellitus with hyperlipidemia (HCC) (Chronic)   Most recent A1c 6.3 and LDL 46.  Well-controlled on current meds.-Continue atorvastatin  80 mg and diabetes management per PCP.  She is on Mounjaro , Humalog  insulin  send not on SGLT2 inhibitor       Relevant Orders   Basic metabolic panel with GFR (Completed)   CBC (Completed)     Other   Chronic respiratory failure with hypoxia (HCC) (Chronic)   Chronic respiratory failure with hypoxia, likely related to interstitial lung disease and pulmonary hypertension. - Continue oxygen  therapy as needed.      Relevant Orders   Basic metabolic panel with GFR (Completed)   CBC (Completed)   ILD (interstitial lung disease) (HCC) (Chronic)   ILD noted but now CT scan suggest  possibility of CTEPH.  Pulmonologist recommends right heart catheterization.      Relevant Orders   Basic metabolic panel with GFR (Completed)   CBC (Completed)   Obesity, Class III, BMI 40-49.9 (morbid obesity) (HCC) (Chronic)   Her obesity is contributing to her lack of mobility and dyspnea. She is on Mounjaro , but has not had to see significant weight loss      Relevant Orders   Basic metabolic panel with GFR (Completed)   CBC (Completed)   OSA on CPAP (Chronic)   Continue with plans to set up CPAP with Dr. Geronimo.      Relevant Orders   Basic metabolic panel with GFR (Completed)   CBC (Completed)       Informed Consent   Shared Decision Making/Informed Consent The risks, including but not limited to, [bleeding or vascular complications (1 in 500), pneumothorax (1 in 1600), arrhythmia (1 in 1000) and death (1 in 5000)], benefits (diagnostic support and/or management of heart failure, pulmonary hypertension) and alternatives of a right heart catheterization were discussed in detail with Ms. Amescua and she is willing to proceed. The risks [stroke (1 in 1000), death (1 in 1000), kidney failure [usually temporary] (1 in 500), bleeding (1 in 200), allergic reaction [possibly serious] (1 in 200)], benefits (diagnostic support and management of coronary artery disease) and alternatives of a cardiac catheterization were discussed in detail with Ms. Epler and she is willing to proceed.      Follow-Up: Return in about 4 weeks (around 09/11/2024).  I spent 60 minutes in the care of Lynn Lawrnce Vicci today including reviewing labs (2 minutes), reviewing outside labs  from PCP via [facility] (included intermittent lab review), reviewing studies (recent cardiac cath, CTA and echo as well as chest CT reviewed-5 minutes), reviewing outside studies (from [facility] echo and CTA reviewed-4 minutes), face to face time discussing treatment options (30 minutes), reviewing records from previous notes  and Dr. Reeves note (5 minutes), 14 minutes, and documenting in the encounter.      Signed, Alm MICAEL Clay, MD, MS Alm Clay, M.D., M.S. Interventional Cardiologist  [employer] Pager # [phone]

## 2024-08-15 ENCOUNTER — Ambulatory Visit: Payer: Self-pay | Admitting: Cardiology

## 2024-08-15 ENCOUNTER — Encounter: Payer: Self-pay | Admitting: Cardiology

## 2024-08-15 LAB — CBC
Hematocrit: 27.8 % — ABNORMAL LOW (ref 34.0–46.6)
Hemoglobin: 7.6 g/dL — ABNORMAL LOW (ref 11.1–15.9)
MCH: 18.3 pg — ABNORMAL LOW (ref 26.6–33.0)
MCHC: 27.3 g/dL — ABNORMAL LOW (ref 31.5–35.7)
MCV: 67 fL — ABNORMAL LOW (ref 79–97)
Platelets: 312 x10E3/uL (ref 150–450)
RBC: 4.16 x10E6/uL (ref 3.77–5.28)
RDW: 17.2 % — ABNORMAL HIGH (ref 11.7–15.4)
WBC: 7.3 x10E3/uL (ref 3.4–10.8)

## 2024-08-15 LAB — BASIC METABOLIC PANEL WITH GFR
BUN/Creatinine Ratio: 11 — AB (ref 12–28)
BUN: 14 mg/dL (ref 8–27)
CO2: 27 mmol/L (ref 20–29)
Calcium: 9.4 mg/dL (ref 8.7–10.3)
Chloride: 96 mmol/L (ref 96–106)
Creatinine, Ser: 1.23 mg/dL — AB (ref 0.57–1.00)
Glucose: 143 mg/dL — AB (ref 70–99)
Potassium: 4.9 mmol/L (ref 3.5–5.2)
Sodium: 137 mmol/L (ref 134–144)
eGFR: 47 mL/min/1.73 — AB (ref >59)

## 2024-08-15 NOTE — Assessment & Plan Note (Signed)
 Assess hemoglobin level prior to catheterization.

## 2024-08-15 NOTE — Assessment & Plan Note (Signed)
 Elevated Coronary Calcium  Score with significant plaque burden and moderate 40 to 50% stenosis CT angiogram suggested 70% ostial D2 disease.  She has chest pain intermittently which could be angina from versus MS related pain.  Could also suspect microvascular disease.  Intermittent chest pain with suspected microvascular disease. Differential includes coronary artery disease and microvascular disease. Potential for small vessel disease not visible on CT or cath. - Perform right and left heart catheterization to assess for coronary artery disease and microvascular disease. - Measure coronary flow reserve during catheterization to evaluate for small vessel disease. - Consider medication adjustments based on catheterization results.

## 2024-08-15 NOTE — Assessment & Plan Note (Signed)
 Chest pain symptoms occurring at rest and with exertion although she does not exert herself much.  Cannot exclude atypical angina based on her coronary artery disease. Continue Imdur  With plans for right heart catheterization to evaluate pulm hypertension and possible CTEPH, we will also proceed with left heart catheterization to exclude progression of CAD.SABRA

## 2024-08-15 NOTE — Assessment & Plan Note (Signed)
 Suspected pulmonary hypertension contributing to shortness of breath and chest pain.  CT scan suggested CTEPH along with existing obesity, OSA at ILD - Perform right heart catheterization to assess pulmonary pressures.

## 2024-08-15 NOTE — Assessment & Plan Note (Signed)
 Most recent A1c 6.3 and LDL 46.  Well-controlled on current meds.-Continue atorvastatin  80 mg and diabetes management per PCP.  She is on Mounjaro , Humalog  insulin  send not on SGLT2 inhibitor

## 2024-08-15 NOTE — Assessment & Plan Note (Signed)
 Chronic diastolic heart failure with improved swelling on current diuretic regimen.   - Continue current diuretic regimen with furosemide  1 mg daily and spironolactone  25 mg daily - Continue current dose of diltiazem  360 mg daily and losartan  100 mg daily with low threshold to consider adding bisoprolol since she still has some tachycardia spells.

## 2024-08-15 NOTE — Assessment & Plan Note (Signed)
 Her obesity is contributing to her lack of mobility and dyspnea. She is on Mounjaro , but has not had to see significant weight loss

## 2024-08-15 NOTE — Assessment & Plan Note (Signed)
 Atrial arrhythmia (palpitations, possible paroxysmal atrial fibrillation) Palpitations with possible paroxysmal atrial fibrillation. No confirmed episodes of atrial fibrillation. - Order ambulatory heart monitor to assess for atrial fibrillation. - Consider bisoprolol if monitor shows concerning findings.

## 2024-08-15 NOTE — Assessment & Plan Note (Signed)
 Due to concern for possible spasms she is on Imdur  30 mg daily

## 2024-08-15 NOTE — Assessment & Plan Note (Signed)
 ILD noted but now CT scan suggest possibility of CTEPH.  Pulmonologist recommends right heart catheterization.

## 2024-08-15 NOTE — Assessment & Plan Note (Signed)
 Continue with plans to set up CPAP with Dr. Geronimo.

## 2024-08-15 NOTE — Assessment & Plan Note (Signed)
 Chronic respiratory failure with hypoxia, likely related to interstitial lung disease and pulmonary hypertension. - Continue oxygen  therapy as needed.

## 2024-08-15 NOTE — Assessment & Plan Note (Addendum)
 Mitral stenosis with moderate left atrial dilation and large calcification on the mitral valve. No surgical options available due to the extent of calcification. - Order echocardiogram to assess mitral stenosis progression.

## 2024-08-15 NOTE — Assessment & Plan Note (Signed)
 Not sure if he is having breakthrough spells of A-fib versus PAT.  Remains on Eliquis  and diltiazem  360 mg daily.. Will consider adding bisoprolol for additional rate control.  Continue Eliquis .

## 2024-08-15 NOTE — Assessment & Plan Note (Signed)
 Blood pressure slightly elevated today, possibly due to exertion. - Monitor blood pressure during upcoming procedures. For now continue current doses of losartan  100 daily, diltiazem  XT 360 mg daily along with spironolactone  25 mg daily, furosemide  40 daily and Imdur  30 daily.  Would consider bisoprolol plus or minus hydralazine  for additional blood pressure control.

## 2024-08-17 ENCOUNTER — Encounter (HOSPITAL_COMMUNITY): Payer: Self-pay | Admitting: Pharmacy Technician

## 2024-08-17 ENCOUNTER — Observation Stay (HOSPITAL_COMMUNITY)
Admission: EM | Admit: 2024-08-17 | Discharge: 2024-08-18 | Disposition: A | Discharge status: Home / Self Care | Attending: Internal Medicine | Admitting: Internal Medicine

## 2024-08-17 ENCOUNTER — Other Ambulatory Visit: Payer: Self-pay

## 2024-08-17 DIAGNOSIS — I110 Hypertensive heart disease with heart failure: Secondary | ICD-10-CM | POA: Diagnosis not present

## 2024-08-17 DIAGNOSIS — E1169 Type 2 diabetes mellitus with other specified complication: Secondary | ICD-10-CM | POA: Diagnosis present

## 2024-08-17 DIAGNOSIS — D509 Iron deficiency anemia, unspecified: Secondary | ICD-10-CM | POA: Diagnosis not present

## 2024-08-17 DIAGNOSIS — Z6841 Body Mass Index (BMI) 40.0 and over, adult: Secondary | ICD-10-CM | POA: Insufficient documentation

## 2024-08-17 DIAGNOSIS — Z87891 Personal history of nicotine dependence: Secondary | ICD-10-CM | POA: Diagnosis not present

## 2024-08-17 DIAGNOSIS — I480 Paroxysmal atrial fibrillation: Secondary | ICD-10-CM | POA: Diagnosis present

## 2024-08-17 DIAGNOSIS — Z79899 Other long term (current) drug therapy: Secondary | ICD-10-CM | POA: Diagnosis not present

## 2024-08-17 DIAGNOSIS — J849 Interstitial pulmonary disease, unspecified: Secondary | ICD-10-CM | POA: Diagnosis present

## 2024-08-17 DIAGNOSIS — E785 Hyperlipidemia, unspecified: Secondary | ICD-10-CM | POA: Diagnosis present

## 2024-08-17 DIAGNOSIS — E119 Type 2 diabetes mellitus without complications: Secondary | ICD-10-CM | POA: Diagnosis not present

## 2024-08-17 DIAGNOSIS — Z7901 Long term (current) use of anticoagulants: Secondary | ICD-10-CM | POA: Diagnosis not present

## 2024-08-17 DIAGNOSIS — Z794 Long term (current) use of insulin: Secondary | ICD-10-CM | POA: Diagnosis not present

## 2024-08-17 DIAGNOSIS — I5030 Unspecified diastolic (congestive) heart failure: Secondary | ICD-10-CM | POA: Insufficient documentation

## 2024-08-17 DIAGNOSIS — J9611 Chronic respiratory failure with hypoxia: Secondary | ICD-10-CM | POA: Diagnosis not present

## 2024-08-17 DIAGNOSIS — I2510 Atherosclerotic heart disease of native coronary artery without angina pectoris: Secondary | ICD-10-CM | POA: Diagnosis present

## 2024-08-17 DIAGNOSIS — J449 Chronic obstructive pulmonary disease, unspecified: Secondary | ICD-10-CM | POA: Diagnosis present

## 2024-08-17 DIAGNOSIS — D649 Anemia, unspecified: Secondary | ICD-10-CM | POA: Diagnosis present

## 2024-08-17 DIAGNOSIS — D563 Thalassemia minor: Principal | ICD-10-CM | POA: Insufficient documentation

## 2024-08-17 DIAGNOSIS — R040 Epistaxis: Secondary | ICD-10-CM | POA: Diagnosis not present

## 2024-08-17 DIAGNOSIS — Z87898 Personal history of other specified conditions: Secondary | ICD-10-CM

## 2024-08-17 DIAGNOSIS — I10 Essential (primary) hypertension: Secondary | ICD-10-CM | POA: Diagnosis present

## 2024-08-17 DIAGNOSIS — I5032 Chronic diastolic (congestive) heart failure: Secondary | ICD-10-CM | POA: Diagnosis present

## 2024-08-17 DIAGNOSIS — E66813 Obesity, class 3: Secondary | ICD-10-CM | POA: Diagnosis present

## 2024-08-17 LAB — COMPREHENSIVE METABOLIC PANEL WITH GFR
ALT: 17 U/L (ref 0–44)
AST: 18 U/L (ref 15–41)
Albumin: 3.7 g/dL (ref 3.5–5.0)
Alkaline Phosphatase: 85 U/L (ref 38–126)
Anion gap: 11 (ref 5–15)
BUN: 17 mg/dL (ref 8–23)
CO2: 29 mmol/L (ref 22–32)
Calcium: 9.1 mg/dL (ref 8.9–10.3)
Chloride: 96 mmol/L — ABNORMAL LOW (ref 98–111)
Creatinine, Ser: 1.16 mg/dL — ABNORMAL HIGH (ref 0.44–1.00)
GFR, Estimated: 51 mL/min — ABNORMAL LOW (ref >60)
Glucose, Bld: 176 mg/dL — ABNORMAL HIGH (ref 70–99)
Potassium: 4.4 mmol/L (ref 3.5–5.1)
Sodium: 136 mmol/L (ref 135–145)
Total Bilirubin: 0.2 mg/dL (ref 0.0–1.2)
Total Protein: 7.7 g/dL (ref 6.5–8.1)

## 2024-08-17 LAB — IRON AND TIBC
Iron: 89 ug/dL (ref 28–170)
Saturation Ratios: 21 % (ref 10.4–31.8)
TIBC: 428 ug/dL (ref 250–450)
UIBC: 339 ug/dL

## 2024-08-17 LAB — CBC
HCT: 26.4 % — ABNORMAL LOW (ref 36.0–46.0)
Hemoglobin: 7.4 g/dL — ABNORMAL LOW (ref 12.0–15.0)
MCH: 18.2 pg — ABNORMAL LOW (ref 26.0–34.0)
MCHC: 28 g/dL — ABNORMAL LOW (ref 30.0–36.0)
MCV: 65 fL — ABNORMAL LOW (ref 80.0–100.0)
Platelets: 277 K/uL (ref 150–400)
RBC: 4.06 MIL/uL (ref 3.87–5.11)
RDW: 18 % — ABNORMAL HIGH (ref 11.5–15.5)
WBC: 7.8 K/uL (ref 4.0–10.5)
nRBC: 0 % (ref 0.0–0.2)

## 2024-08-17 LAB — FERRITIN: Ferritin: 17 ng/mL (ref 11–307)

## 2024-08-17 LAB — PREPARE RBC (CROSSMATCH)

## 2024-08-17 LAB — HIV ANTIBODY (ROUTINE TESTING W REFLEX): HIV Screen 4th Generation wRfx: NONREACTIVE

## 2024-08-17 LAB — GLUCOSE, CAPILLARY: Glucose-Capillary: 152 mg/dL — ABNORMAL HIGH (ref 70–99)

## 2024-08-17 LAB — POC OCCULT BLOOD, ED: Fecal Occult Bld: NEGATIVE

## 2024-08-17 MED ORDER — ISOSORBIDE MONONITRATE ER 30 MG PO TB24
30.0000 mg | ORAL_TABLET | Freq: Every day | ORAL | Status: DC
  Administered 2024-08-18: 30 mg via ORAL
  Filled 2024-08-17: qty 1

## 2024-08-17 MED ORDER — ALPRAZOLAM 0.25 MG PO TABS: 0.2500 mg | ORAL_TABLET | Freq: Two times a day (BID) | ORAL | Status: DC | PRN

## 2024-08-17 MED ORDER — INSULIN ASPART 100 UNIT/ML IJ SOLN
0.0000 [IU] | Freq: Three times a day (TID) | INTRAMUSCULAR | Status: DC
  Administered 2024-08-18: 2 [IU] via SUBCUTANEOUS
  Administered 2024-08-18: 1 [IU] via SUBCUTANEOUS
  Filled 2024-08-17 (×2): qty 2

## 2024-08-17 MED ORDER — FOLIC ACID 1 MG PO TABS
2.0000 mg | ORAL_TABLET | Freq: Every day | ORAL | Status: DC
  Administered 2024-08-18: 2 mg via ORAL
  Filled 2024-08-17: qty 2

## 2024-08-17 MED ORDER — SPIRONOLACTONE 25 MG PO TABS
25.0000 mg | ORAL_TABLET | Freq: Every day | ORAL | Status: DC
  Administered 2024-08-18: 25 mg via ORAL
  Filled 2024-08-17: qty 1

## 2024-08-17 MED ORDER — ACETAMINOPHEN 650 MG RE SUPP: 650.0000 mg | Freq: Four times a day (QID) | RECTAL | Status: DC | PRN

## 2024-08-17 MED ORDER — DILTIAZEM HCL ER COATED BEADS 180 MG PO CP24
360.0000 mg | ORAL_CAPSULE | Freq: Every day | ORAL | Status: DC
  Administered 2024-08-18: 360 mg via ORAL
  Filled 2024-08-17: qty 2

## 2024-08-17 MED ORDER — ACETAMINOPHEN 325 MG PO TABS: 650.0000 mg | ORAL_TABLET | Freq: Four times a day (QID) | ORAL | Status: DC | PRN

## 2024-08-17 MED ORDER — SODIUM CHLORIDE 0.9% IV SOLUTION: Freq: Once | INTRAVENOUS | Status: AC

## 2024-08-17 MED ORDER — ALBUTEROL SULFATE (2.5 MG/3ML) 0.083% IN NEBU: 2.5000 mg | INHALATION_SOLUTION | Freq: Four times a day (QID) | RESPIRATORY_TRACT | Status: DC | PRN

## 2024-08-17 MED ORDER — ATORVASTATIN CALCIUM 80 MG PO TABS: 80.0000 mg | ORAL_TABLET | Freq: Every evening | ORAL | Status: DC

## 2024-08-17 MED ORDER — PANTOPRAZOLE SODIUM 40 MG PO TBEC
40.0000 mg | DELAYED_RELEASE_TABLET | Freq: Every day | ORAL | Status: DC
  Administered 2024-08-18: 40 mg via ORAL
  Filled 2024-08-17: qty 1

## 2024-08-17 MED ORDER — LOSARTAN POTASSIUM 50 MG PO TABS
50.0000 mg | ORAL_TABLET | Freq: Every day | ORAL | Status: DC
  Administered 2024-08-18: 50 mg via ORAL
  Filled 2024-08-17: qty 1

## 2024-08-17 MED ORDER — SODIUM CHLORIDE 0.9% FLUSH
3.0000 mL | Freq: Two times a day (BID) | INTRAVENOUS | Status: DC
  Administered 2024-08-17 – 2024-08-18 (×2): 3 mL via INTRAVENOUS

## 2024-08-17 MED ORDER — TRAZODONE HCL 50 MG PO TABS
25.0000 mg | ORAL_TABLET | Freq: Every evening | ORAL | Status: DC | PRN
  Administered 2024-08-17: 50 mg via ORAL
  Filled 2024-08-17: qty 1

## 2024-08-17 MED ORDER — INSULIN ASPART 100 UNIT/ML IJ SOLN: 0.0000 [IU] | Freq: Every day | INTRAMUSCULAR | Status: DC

## 2024-08-17 MED ORDER — SALINE SPRAY 0.65 % NA SOLN: 1.0000 | NASAL | Status: DC | PRN

## 2024-08-17 NOTE — H&P (Addendum)
 History and Physical    Patient: Lynn [name] FMW:[id number] DOB: January 18, 1956 DOA: 08/17/2024 DOS: the patient was seen and examined on 08/17/2024 PCP: Watt [name], MD  Patient coming from: Home  Chief Complaint:  Chief Complaint  Patient presents with   Low Hemoglobin   HPI: Lynn [name] is a 68 y.o. female with medical history significant of hypertension, hyperlipidemia, heart failure with preserved ejection fraction, CAD, paroxysmal atrial fibrillation on chronic anticoagulation, diabetes mellitus type 2, CVA, COPD, chronic anemia, rheumatoid arthritis,idiopathic pulmonary fibrosis and a history of gastrointestinal bleeding, presents with a drop in hemoglobin and chest pain.  She has been experiencing chest pain recently and was scheduled for a cardiac catheterization. During pre-procedure blood work with her primary, a drop in hemoglobin was noted 3 days ago.  She has a history of idiopathic pulmonary fibrosis and is on oxygen  therapy.  She has a history of gastrointestinal bleeding,Her last gastrointestinal scope was in 2024. No black or dark stools and no stomach pain. She had dark stools last year when she experienced similar symptoms. She stopped taking Eliquis  three days ago in preparation for her upcoming cardiac catheterization. Her hemoglobin level was 7.6 on Friday.  She experienced severe epistaxis about 4 days ago as well lasting about an hour to an hour and a half, primarily from the left nostril, with clots being expelled.  On admission to the emergency department patient was noted to be around tachypnea, and all other vital signs maintained.  Labs significant for hemoglobin 7.4, chloride 96, BUN 17, creatinine 1.16, and glucose 176.  Stool guaiac was noted to be negative. Patient was typed and screened and ordered 1 unit of packed red blood cells.     Review of Systems: As mentioned in the history of present illness. All other systems reviewed  and are negative. Past Medical History:  Diagnosis Date   Allergy 2018   Anxiety    CHF (congestive heart failure) (HCC)    Clotting disorder    blood clot in eye 2016   COPD (chronic obstructive pulmonary disease) (HCC)    Coronary artery disease, non-occlusive    a. 11/2016 NSTEMI/Cath: LM nl, LAD 40p, 50md, D1/2 small, LCX 40ost, OM2/3 nl/small, RCA 35 ost/mid, RPDA/RPL small/nl.   Coronary vasospasm    Depression    Diastolic dysfunction    a. 11/2016 Echo: EF 55-60%, gr1 DD, sev calcified MV annulus, mildly dil LA.   Eye hemorrhage 01/2015   right; resolved (07/03/2017)   Gastric ulcer    GERD (gastroesophageal reflux disease)    Headache    monthly (07/03/2017)   Heart murmur    History of blood transfusion    when I had an ectopic pregnancy   History of stomach ulcers    Hyperlipidemia    Hypertension    Microcytic anemia    Moderate mitral stenosis by prior echocardiogram 03/2021   TTE: Mod MS (mean gradient 9 mmHg). -- TEE Moderate-Severe MS (mean gradient 14 mmHg) with fixed Post MV Leaflet & Severe MAC w/ large circumscribed calcified mass on the Post MV Leaflet. (Mass previously described in 2018) - CMRI identifies calcified mass & not Tumor.; R&LHC - LVEDP & PCWP both 23 mmHg indicating minimal resting gradient   Myocardial infarction (HCC) 2023   OSA on CPAP    setting is unknown   Oxygen  deficiency    PAF (paroxysmal atrial fibrillation) (HCC) 03/2021   Event Monitor Aug-Sept 2022: Predominantly sinus rhythm.  1% A. fib burden.  30 brief episodes of PAT.  2 short bursts of NSVT.   PAT (paroxysmal atrial tachycardia)    Event monitor from August 2022 showed 30 brief bursts longest 14 seconds.   Pneumonia    several times (07/03/2017)   PVC's (premature ventricular contractions)    Rheumatoid arthritis (HCC)    Sickle cell anemia (HCC)    Sleep apnea    Stroke [facility])    Syncope 07/03/2017 X 2   called seizures but no medications   Thalassemia    my  cells are sickle cell shaped but I don't have sickle cell anemia (07/03/2017)   Type II diabetes mellitus (HCC)    Past Surgical History:  Procedure Laterality Date   48-Hour Monitor  03/18/2017   Sinus rhythm with sinus tachycardia (rate 58-134 BPM)multiple PVCs noted with couplets and bigeminy. One triplet. 7 runs of PAT ranging from 100-130 bpm. Longest was 33 beats.   BALLOON ENTEROSCOPY N/A 08/13/2023   Procedure: BALLOON ENTEROSCOPY;  Surgeon: San Sandor GAILS, DO;  Location: [facility];  Service: Gastroenterology;  Laterality: N/A;   BIOPSY  04/03/2021   Procedure: BIOPSY;  Surgeon: Leigh Elspeth SQUIBB, MD;  Location: [facility];  Service: Gastroenterology;;   BIOPSY  12/17/2022   Procedure: BIOPSY;  Surgeon: Abran Norleen SAILOR, MD;  Location: [facility];  Service: Gastroenterology;;   BREAST BIOPSY Left    BRONCHIAL WASHINGS  03/28/2021   Procedure: BRONCHIAL WASHINGS;  Surgeon: Gretta Leita SQUIBB, DO;  Location: [facility];  Service: Endoscopy;;   CARDIAC CATHETERIZATION N/A 11/19/2016   Procedure: Left Heart Cath and Coronary Angiography;  Surgeon: Victory LELON Sharps, MD;  [name] INVASIVE CV LAB;  Service: Cardiovascular.    LM nl, LAD 40p, 50md, D1/2 small, LCX 40ost, OM2/3 nl/small, RCA 35 ost/mid, RPDA/RPL small/nl.   CARDIAC MRI  03/30/2021   Normal LVEF ~53%. 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 No Scar or Late Gadolinium Enhancement on LV Myocardium.   CARPAL TUNNEL RELEASE Right 11/01/2014   COLONOSCOPY     DILATION AND CURETTAGE OF UTERUS     ECTOPIC PREGNANCY SURGERY  X 2   ENTEROSCOPY N/A 09/05/2022   Procedure: ENTEROSCOPY;  Surgeon: Wilhelmenia Aloha Raddle., MD;  Location: [facility];  Service: Gastroenterology;  Laterality: N/A;   ENTEROSCOPY N/A 07/17/2023   Procedure: ENTEROSCOPY;  Surgeon: Abran Norleen SAILOR, MD;  Location: [facility];  Service: Gastroenterology;  Laterality: N/A;   ESOPHAGOGASTRODUODENOSCOPY N/A  12/17/2022   Procedure: ESOPHAGOGASTRODUODENOSCOPY (EGD);  Surgeon: Abran Norleen SAILOR, MD;  Location: [facility];  Service: Gastroenterology;  Laterality: N/A;   ESOPHAGOGASTRODUODENOSCOPY (EGD) WITH PROPOFOL  N/A 04/03/2021   Procedure: ESOPHAGOGASTRODUODENOSCOPY (EGD) WITH PROPOFOL ;  Surgeon: Leigh Elspeth SQUIBB, MD;  Location: [facility];  Service: Gastroenterology;  Laterality: N/A;   ESOPHAGOGASTRODUODENOSCOPY (EGD) WITH PROPOFOL  N/A 08/15/2021   Procedure: ESOPHAGOGASTRODUODENOSCOPY (EGD) WITH PROPOFOL ;  Surgeon: Aneita Gwendlyn DASEN, MD;  Location: [facility];  Service: Endoscopy;  Laterality: N/A;   GIVENS CAPSULE STUDY N/A 07/17/2023   Procedure: GIVENS CAPSULE STUDY;  Surgeon: Abran Norleen SAILOR, MD;  Location: [facility];  Service: Gastroenterology;  Laterality: N/A;   HOT HEMOSTASIS N/A 09/05/2022   Procedure: HOT HEMOSTASIS (ARGON PLASMA COAGULATION/BICAP);  Surgeon: Wilhelmenia Aloha Raddle., MD;  Location: [facility];  Service: Gastroenterology;  Laterality: N/A;   HOT HEMOSTASIS N/A 08/13/2023   Procedure: HOT HEMOSTASIS (ARGON PLASMA COAGULATION/BICAP);  Surgeon: San Sandor GAILS, DO;  Location: [facility];  Service: Gastroenterology;  Laterality: N/A;  JOINT REPLACEMENT     KNEE ARTHROSCOPY Left 11/2014   RIGHT/LEFT HEART CATH AND CORONARY ANGIOGRAPHY N/A 08/08/2021   Procedure: RIGHT/LEFT HEART CATH AND CORONARY ANGIOGRAPHY;  Surgeon: [name], MD;  Location: [facility] INVASIVE CV LAB;  Service: Cardiovascular;Ost LCx 40%. Ost-Prox RCA 35%. Prox-Mid LAD 25%-<25% D2. Mod-Severely Elevated LVEDP. Mild PA HTN -> PA mean 31 mmHg with a PCWP and LVEDP of 23 mmHg   SUBMUCOSAL TATTOO INJECTION  09/05/2022   Procedure: SUBMUCOSAL TATTOO INJECTION;  Surgeon: Wilhelmenia Aloha Raddle., MD;  Location: [facility];  Service: Gastroenterology;;   SUBMUCOSAL TATTOO INJECTION  07/17/2023   Procedure: SUBMUCOSAL TATTOO INJECTION;  Surgeon: Abran Norleen SAILOR, MD;  Location: [facility];  Service:  Gastroenterology;;   TEE WITHOUT CARDIOVERSION N/A 03/28/2021   Procedure: TRANSESOPHAGEAL ECHOCARDIOGRAM (TEE);  Surgeon: Alveta Aleene PARAS, MD;  Location: [facility] ENDOSCOPY;; LVEF 55-60%. Normal LV Fxn & no RWMA. No LAA thrombus. Gr II Ao Plaque. Large circumscribed calcific mass (1.8 x 1.4 cm) anterior to posterior annulus.  Fixed posterior leaflet with Mod-Severe MS (Mean MVG ~14 mmHg, Peak 20 mmhg).  Mild MR   TONSILLECTOMY     TOTAL KNEE ARTHROPLASTY Right 02/18/2015   Procedure: RIGHT TOTAL KNEE ARTHROPLASTY;  Surgeon: Marcey Her, MD;  Location: [facility] OR;  Service: Orthopedics;  Laterality: Right;   TOTAL KNEE ARTHROPLASTY Left 08/19/2015   Procedure: LEFT TOTAL KNEE ARTHROPLASTY;  Surgeon: Marcey Her, MD;  [facility];  Service: Orthopedics;  Laterality: Left;   TRANSTHORACIC ECHOCARDIOGRAM  11/2016; 07/2017:   a) normal LV size, thickness and function. EF 55-60%. GR 1 DD.No RWMA severe mitral annular calcification, but no mitral stenosis. Mild LA dilation;; b) normal LV size and function.  EF 65-75%.  Unable to assess diastolic function.  Aortic sclerosis with no stenosis.  Moderate mitral stenosis? (Gradient 9 mmHg) -?  Mild-mod left atrial enlargement    TRANSTHORACIC ECHOCARDIOGRAM  01/2019   EF 65 to 70%.  Normal function.  Elevated LVEDP-GR 1 DD.  Normal RV size and function.  Large calcific mass on posterior mitral leaflet mild to moderate mitral stenosis.   TRANSTHORACIC ECHOCARDIOGRAM  03/24/2021   EF 60 to 65%.  LV function with normal wall motion.  Moderate basal septal LVH.  GRII DD & Mild LA dilation.-> elevated LVEDP.  Normal RV function.  Mildly elevated PAP.  Ill-defined density measuring 3.23 x 2.1 cm region of the posterior MV leaflet along with dense MAC.  Moderate MS (mean MVG of 9 mmHg.) Normal AoV & RAP. --> Recommend TEE 2/2 concern for MV SBE.   TRANSTHORACIC ECHOCARDIOGRAM  04/26/2022   EF 60 to 65%.  No RWMA.  Moderate LVH with GR 1 DD.  Normal RV size and function.   Mild LA dilation.  Large calcific mass of posterior MV leaflet.  Mean PG 13 mm 3.  Trivial MR.  Mild to moderate MS.  Severe MAC.  Normal AOV.  Normal RAP.   VAGINAL HYSTERECTOMY     VIDEO BRONCHOSCOPY N/A 03/28/2021   Procedure: VIDEO BRONCHOSCOPY WITHOUT FLUORO;  Surgeon: Gretta Leita SQUIBB, DO;  Location: [facility] ENDOSCOPY;  Service: Endoscopy;  Laterality: N/A;   Social History:  reports that she quit smoking about 9 years ago. Her smoking use included cigarettes. She started smoking about 53 years ago. She has a 11 pack-year smoking history. She has never used smokeless tobacco. She reports that she does not currently use alcohol. She reports that she does not use drugs.  Allergies  Allergen  Reactions   Methotrexate Other (See Comments)    Pneumonitis   Penicillins Hives    Childhood allergy Has patient had a PCN reaction causing immediate rash, facial/tongue/throat swelling, SOB or lightheadedness with hypotension: Yes Has patient had a PCN reaction causing severe rash involving mucus membranes or skin necrosis: No Has patient had a PCN reaction that required hospitalization No Has patient had a PCN reaction occurring within the last 10 years: No If all of the above answers are NO, then may proceed with Cephalosporin use.   Farxiga  [Dapagliflozin ] Other (See Comments)    Dizzy and lethargic    Metformin  And Related Diarrhea and Other (See Comments)    Also, bleeding    Family History  Problem Relation Age of Onset   Cancer Mother    Diabetes Mother    Hypertension Mother    Hyperlipidemia Mother    Cancer Father    Hyperlipidemia Father    Mental illness Sister    Diabetes Sister    Diabetes Maternal Grandmother    Diabetes Maternal Grandfather    Colon cancer Neg Hx    Esophageal cancer Neg Hx    Stomach cancer Neg Hx    Rectal cancer Neg Hx    Tremor Neg Hx    Parkinson's disease Neg Hx     Prior to Admission medications   Medication Sig Start Date End Date Taking?  Authorizing Provider  acetaminophen  (TYLENOL ) 500 MG tablet Take 1,000 mg by mouth every 6 (six) hours as needed (for pain).    [provider]  albuterol  (PROVENTIL ) (2.5 MG/3ML) 0.083% nebulizer solution INHALE 3 ML BY NEBULIZATION EVERY 6 HOURS AS NEEDED FOR WHEEZING OR SHORTNESS OF BREATH 03/30/24   Cobb, Comer GAILS, NP  albuterol  (VENTOLIN  HFA) 108 (90 Base) MCG/ACT inhaler TAKE 2 PUFFS BY MOUTH EVERY 6 HOURS AS NEEDED FOR WHEEZE OR SHORTNESS OF BREATH 07/27/24   Geronimo Amel, MD  ALPRAZolam  (XANAX ) 0.25 MG tablet Take 1 tablet (0.25 mg total) by mouth 2 (two) times daily as needed for anxiety. TAKE 1-2 TABLETS BY MOUTH 2 TIMES DAILY AS NEEDED FOR ANXIETY OR INSOMNIA 06/17/24   Copland, [name], MD  apixaban  (ELIQUIS ) 5 MG TABS tablet Take 1 tablet (5 mg total) by mouth 2 (two) times daily. 08/27/23   Meng, Hao, PA  atorvastatin  (LIPITOR ) 80 MG tablet Take 1 tablet (80 mg total) by mouth every evening. 08/27/23   Meng, Hao, PA  cetirizine (ZYRTEC) 10 MG tablet Take 10 mg by mouth daily as needed for allergies.    [provider]  Continuous Glucose Sensor (DEXCOM G7 SENSOR) MISC 1 Device by Does not apply route as directed. 07/22/24   [name], Donell Cardinal, MD  diltiazem  (CARDIZEM  CD) 360 MG 24 hr capsule TAKE 1 CAPSULE BY MOUTH EVERY DAY 02/17/24   Meng, Hao, PA  folic acid  (FOLVITE ) 1 MG tablet Take 2 tablets (2 mg total) by mouth daily. 01/21/20   Copland, [name], MD  furosemide  (LASIX ) 40 MG tablet Take 40 mg by mouth in the morning and an additional 40 mg for an overnight weight gain of 3 pounds or 5 pounds in a week 06/08/24   [name], MD  glucose blood (CONTOUR NEXT TEST) test strip 3x daily 07/20/19   [name], [name], MD  hydroxychloroquine  (PLAQUENIL ) 200 MG tablet Take 200 mg by mouth 2 (two) times daily. 05/30/22   [provider]  Insulin  Disposable Pump (OMNIPOD 5 G7 PODS, GEN 5,) MISC  1 Device by Other route every other day.  07/22/24   [name], [name], MD  insulin  lispro (HUMALOG ) 100 UNIT/ML injection Max Daily 70 units per pump 07/22/24   [name], [name], MD  isosorbide  mononitrate (IMDUR ) 30 MG 24 hr tablet Take 1 tablet (30 mg total) by mouth daily. 08/27/23   Meng, Hao, PA  Lancets 30G MISC 1 Device by Does not apply route 3 (three) times daily. 07/16/19   Copland, [name], MD  losartan  (COZAAR ) 100 MG tablet Take 0.5 tablets (50 mg total) by mouth daily. 01/20/24   [name], MD  Melatonin 5 MG CHEW Chew 10 mg by mouth at bedtime.    [provider]  Multiple Vitamin (MULTIVITAMIN WITH MINERALS) TABS tablet Take 1 tablet by mouth daily with breakfast.    [provider]  Oxymetazoline  HCl (VICKS SINEX NA) Place 1 spray into both nostrils every 12 (twelve) hours as needed (for congestion- MAX OF THREE CONSECUTIVE DAYS).    [provider]  pantoprazole  (PROTONIX ) 40 MG tablet Take 1 tablet (40 mg total) by mouth daily before breakfast. 03/03/24   Zehr, Jessica D, PA-C  SAFETY-LOK TB SYRINGE 27GX.5 27G X 1/2 1 ML MISC  08/18/19   [provider]  sodium chloride  (OCEAN) 0.65 % SOLN nasal spray Place 1 spray into both nostrils as needed. 07/11/23   Soldatova, Liuba, MD  spironolactone  (ALDACTONE ) 25 MG tablet Take 1 tablet (25 mg total) by mouth daily. 06/08/24 09/06/24  [name], MD  tirzepatide  (MOUNJARO ) 15 MG/0.5ML Pen Inject 15 mg into the skin once a week. 07/22/24   [name], [name], MD  traZODone  (DESYREL ) 50 MG tablet Take 0.5-1 tablets (25-50 mg total) by mouth at bedtime as needed for sleep. Needs appt 06/17/24   Watt [name], MD    Physical Exam: Vitals:   08/17/24 1330 08/17/24 1333 08/17/24 1335 08/17/24 1340  BP: (!) 157/72 (!) 157/72    Pulse: 63 63 66 71  Resp: 18 18 20 18   Temp:  97.7 F (36.5 C)    TempSrc:  Oral    SpO2: 100% 100% 100% 100%     Constitutional: Elderly female current NAD, calm,  comfortable Eyes: PERRL, lids and conjunctivae normal ENMT: Mucous membranes are moist. Posterior pharynx clear of any exudate or lesions.Normal dentition.  Neck: normal, supple, no masses, no JVD present. Respiratory: Decreased overall aeration without significant wheezes or rhonchi appreciated.  O2 saturations maintained on 3 L nasal cannula oxygen . Cardiovascular: Regular rate and rhythm, no murmurs / rubs / gallops. No extremity edema. 2+ pedal pulses.   Abdomen: no tenderness, no masses palpated.  Bowel sounds positive.  Musculoskeletal: no clubbing / cyanosis. No joint deformity upper and lower extremities. Good ROM, no contractures. Normal muscle tone.  Skin: no rashes, lesions, ulcers. No induration Neurologic: CN 2-12 grossly intact. . Strength 5/5 in all 4.  Psychiatric: Normal judgment and insight. Alert and oriented x 3. Normal mood.   Data Reviewed:  EKG reveals sinus rhythm at 78 bpm with first-degree heart block.  Reviewed labs, imaging, and pertinent records as documented.  Assessment and Plan:  Microcytic anemia History of epistaxis Acute on chronic.  Patient presents with hemoglobin noted to be down to 7.4 with low MCV and MCH after recently stopping anticoagulation 3 days ago and hemoglobin noted to be 7.6.  Denies any reports of melena.  Did report having significant episode of epistaxis prior to lab work being done on  Friday..  Patient had been typed and screened and ordered 1 unit of packed red blood cells. - Admit to a telemetry - Continue with transfusion of 1 unit of packed red blood cells  - Add-on iron levels to previous studies. - Recheck CBC tomorrow morning - May warrant referral to ENT if patient continues to have nosebleeds.  CAD Patient reports having intermittent chest pains for which she is followed by cardiology.  Prior workup included elevated coronary Calcium  Score with significant plaque burden and moderate 40 to 50% stenosis with CT angiogram  suggested 70% ostial D2 disease.  Plan had been for patient to have cardiac catheterization on 11/5 but had been canceled due to anemia. - Recommend outpatient follow-up with cardiology to reschedule  Paroxysmal atrial fibrillation on chronic anticoagulation Patient appears to be in a sinus rhythm at this time.  She had been holding anticoagulation over the last 3 days in preparation for cardiac catheterization which has now been canceled. - Continue Cardizem  - Consider resuming Eliquis  in a.m. if hemoglobin remains stable and no plans to reschedule cardiac catheterization in the immediate future  Controlled diabetes mellitus type 2, with long-term use of insulin  Last hemoglobin A1c noted to be 6.3 when checked on 07/22/2024.  Home medication regimen includes Mounjaro .  Patient wears an insulin  pump, but is in need of refills. - Hypoglycemic protocols - CBGs before every meal and nightly with sensitive SSI  -Adjust regimen as deemed medically appropriate   Chronic respiratory failure with hypoxia COPD Interstitial lung disease Patient is on 3 L of oxygen  at home. - Continue nasal cannula oxygen  to maintain O2 saturation - Continue albuterol  nebs as needed for shortness of breath/wheezing - Continue outpatient follow-up with pulmonology  Diastolic congestive heart failure Patient appears relatively euvolemic at this time.  Last echocardiogram noted EF to be 60 to 65% with grade 1 diastolic dysfunction. - Continue goal-directed medical therapy as tolerated  Essential hypertension Blood pressures noted to be elevated up to 157/77. - Continue Cardizem , Imdur  furosemide , spironolactone , and losartan   Dyslipidemia - Continue atorvastatin   Morbid obesity BMI 44.64 kg/m  DVT prophylaxis: SCDs Advance Care Planning:   Code Status: Full Code   Consults: None  Family Communication: Patient's husband updated at bedside  Severity of Illness: The appropriate patient status for this  patient is OBSERVATION. Observation status is judged to be reasonable and necessary in order to provide the required intensity of service to ensure the patient's safety. The patient's presenting symptoms, physical exam findings, and initial radiographic and laboratory data in the context of their medical condition is felt to place them at decreased risk for further clinical deterioration. Furthermore, it is anticipated that the patient will be medically stable for discharge from the hospital within 2 midnights of admission.   Author: Maximino DELENA Sharps, MD 08/17/2024 1:44 PM  For on call review [email or web address].

## 2024-08-17 NOTE — ED Notes (Signed)
 CCMD contacted.

## 2024-08-17 NOTE — ED Provider Notes (Signed)
 [facility] Provider Note   CSN: [id number] Arrival date & time: 08/17/24  1054     Patient presents with: Low Hemoglobin   Lynn [name] is a 68 y.o. female with PMH of afib on Eliquis , CAD, T2DM, COPD on 3L home O2, iron deficiency anemia, thalassemia and previous GI bleed sent to the ED by PCP for low hemoglobin. Patient had labs drawn for medical screening prior to a planned cardiac catheterization and found to have a hemoglobin of 7.6.  Patient reports she stopped taking her Eliquis  due to concern for bleeding, her last dose was on Friday evening.  She denies dizziness, lightheadedness, shortness of breath or fatigue.  She has had no hematochezia, melena, or hematemesis.  Patient does report a large-volume nosebleed last week but no other bleeding events.   HPI     Prior to Admission medications   Medication Sig Start Date End Date Taking? Authorizing Provider  acetaminophen  (TYLENOL ) 500 MG tablet Take 1,000 mg by mouth every 6 (six) hours as needed (for pain).    [provider]  albuterol  (PROVENTIL ) (2.5 MG/3ML) 0.083% nebulizer solution INHALE 3 ML BY NEBULIZATION EVERY 6 HOURS AS NEEDED FOR WHEEZING OR SHORTNESS OF BREATH 03/30/24   Cobb, Comer GAILS, NP  albuterol  (VENTOLIN  HFA) 108 (90 Base) MCG/ACT inhaler TAKE 2 PUFFS BY MOUTH EVERY 6 HOURS AS NEEDED FOR WHEEZE OR SHORTNESS OF BREATH 07/27/24   Geronimo Amel, MD  ALPRAZolam  (XANAX ) 0.25 MG tablet Take 1 tablet (0.25 mg total) by mouth 2 (two) times daily as needed for anxiety. TAKE 1-2 TABLETS BY MOUTH 2 TIMES DAILY AS NEEDED FOR ANXIETY OR INSOMNIA 06/17/24   Copland, [name], MD  apixaban  (ELIQUIS ) 5 MG TABS tablet Take 1 tablet (5 mg total) by mouth 2 (two) times daily. 08/27/23   Meng, Hao, PA  atorvastatin  (LIPITOR ) 80 MG tablet Take 1 tablet (80 mg total) by mouth every evening. 08/27/23   Meng, Hao, PA  cetirizine (ZYRTEC) 10 MG tablet Take 10 mg by mouth  daily as needed for allergies.    [provider]  Continuous Glucose Sensor (DEXCOM G7 SENSOR) MISC 1 Device by Does not apply route as directed. 07/22/24   [name], Donell Cardinal, MD  diltiazem  (CARDIZEM  CD) 360 MG 24 hr capsule TAKE 1 CAPSULE BY MOUTH EVERY DAY 02/17/24   Meng, Hao, PA  folic acid  (FOLVITE ) 1 MG tablet Take 2 tablets (2 mg total) by mouth daily. 01/21/20   Copland, [name], MD  furosemide  (LASIX ) 40 MG tablet Take 40 mg by mouth in the morning and an additional 40 mg for an overnight weight gain of 3 pounds or 5 pounds in a week 06/08/24   [name], MD  glucose blood (CONTOUR NEXT TEST) test strip 3x daily 07/20/19   [name], [name], MD  hydroxychloroquine  (PLAQUENIL ) 200 MG tablet Take 200 mg by mouth 2 (two) times daily. 05/30/22   [provider]  Insulin  Disposable Pump (OMNIPOD 5 G7 PODS, GEN 5,) MISC 1 Device by Other route every other day. 07/22/24   [name], [name], MD  insulin  lispro (HUMALOG ) 100 UNIT/ML injection Max Daily 70 units per pump 07/22/24   [name], [name], MD  isosorbide  mononitrate (IMDUR ) 30 MG 24 hr tablet Take 1 tablet (30 mg total) by mouth daily. 08/27/23   Meng, Hao, PA  Lancets 30G MISC 1 Device by Does not apply route 3 (three) times daily. 07/16/19  Copland, [name], MD  losartan  (COZAAR ) 100 MG tablet Take 0.5 tablets (50 mg total) by mouth daily. 01/20/24   [name], MD  Melatonin 5 MG CHEW Chew 10 mg by mouth at bedtime.    [provider]  Multiple Vitamin (MULTIVITAMIN WITH MINERALS) TABS tablet Take 1 tablet by mouth daily with breakfast.    [provider]  Oxymetazoline  HCl (VICKS SINEX NA) Place 1 spray into both nostrils every 12 (twelve) hours as needed (for congestion- MAX OF THREE CONSECUTIVE DAYS).    [provider]  pantoprazole  (PROTONIX ) 40 MG tablet Take 1 tablet (40 mg total) by mouth daily before breakfast. 03/03/24   Zehr, Jessica D,  PA-C  SAFETY-LOK TB SYRINGE 27GX.5 27G X 1/2 1 ML MISC  08/18/19   [provider]  sodium chloride  (OCEAN) 0.65 % SOLN nasal spray Place 1 spray into both nostrils as needed. 07/11/23   Soldatova, Liuba, MD  spironolactone  (ALDACTONE ) 25 MG tablet Take 1 tablet (25 mg total) by mouth daily. 06/08/24 09/06/24  [name], MD  tirzepatide  (MOUNJARO ) 15 MG/0.5ML Pen Inject 15 mg into the skin once a week. 07/22/24   [name], [name], MD  traZODone  (DESYREL ) 50 MG tablet Take 0.5-1 tablets (25-50 mg total) by mouth at bedtime as needed for sleep. Needs appt 06/17/24   Copland, [name], MD    Allergies: Methotrexate, Penicillins, Farxiga  [dapagliflozin ], and Metformin  and related    Review of Systems  Gastrointestinal:  Negative for anal bleeding, blood in stool, constipation and diarrhea.  Neurological:  Negative for dizziness and light-headedness.    Updated Vital Signs BP (!) 132/58 (BP Location: Right Arm)   Pulse 64   Temp (!) 97.4 F (36.3 C) (Oral)   Resp 19   LMP  (LMP Unknown)   SpO2 100%   Physical Exam Vitals and nursing note reviewed.  Constitutional:      General: She is not in acute distress.    Appearance: She is well-developed.  HENT:     Head: Normocephalic and atraumatic.  Eyes:     Conjunctiva/sclera: Conjunctivae normal.  Cardiovascular:     Rate and Rhythm: Normal rate and regular rhythm.     Heart sounds: No murmur heard. Pulmonary:     Effort: Pulmonary effort is normal. No respiratory distress.     Breath sounds: Normal breath sounds.  Abdominal:     Palpations: Abdomen is soft.     Tenderness: There is no abdominal tenderness.  Genitourinary:    Rectum: Normal. Guaiac result negative.  Musculoskeletal:        General: No swelling.     Cervical back: Neck supple.  Skin:    General: Skin is warm and dry.     Capillary Refill: Capillary refill takes less than 2 seconds.  Neurological:     Mental Status: She is alert.   Psychiatric:        Mood and Affect: Mood normal.     (all labs ordered are listed, but only abnormal results are displayed) Labs Reviewed  COMPREHENSIVE METABOLIC PANEL WITH GFR - Abnormal; Notable for the following components:      Result Value   Chloride 96 (*)    Glucose, Bld 176 (*)    Creatinine, Ser 1.16 (*)    GFR, Estimated 51 (*)    All other components within normal limits  CBC - Abnormal; Notable for the following components:   Hemoglobin 7.4 (*)    HCT 26.4 (*)  MCV 65.0 (*)    MCH 18.2 (*)    MCHC 28.0 (*)    RDW 18.0 (*)    All other components within normal limits  FERRITIN  HIV ANTIBODY (ROUTINE TESTING W REFLEX)  IRON AND TIBC  POC OCCULT BLOOD, ED  TYPE AND SCREEN  PREPARE RBC (CROSSMATCH)    EKG: EKG Interpretation Date/Time:  Monday August 17 2024 12:45:41 EST Ventricular Rate:  78 PR Interval:  225 QRS Duration:  93 QT Interval:  388 QTC Calculation: 442 R Axis:   -21  Text Interpretation: Sinus rhythm Prolonged PR interval Borderline left axis deviation Low voltage, precordial leads Consider anterior infarct since last tracing no significant change Confirmed by Lenor Hollering [phone]) on 08/17/2024 12:47:17 PM  Radiology: No results found.   Procedures   Medications Ordered in the ED  sodium chloride  flush (NS) 0.9 % injection 3 mL (3 mLs Intravenous Not Given 08/17/24 1501)  acetaminophen  (TYLENOL ) tablet 650 mg (has no administration in time range)    Or  acetaminophen  (TYLENOL ) suppository 650 mg (has no administration in time range)  albuterol  (PROVENTIL ) (2.5 MG/3ML) 0.083% nebulizer solution 2.5 mg (has no administration in time range)  sodium chloride  (OCEAN) 0.65 % nasal spray 1 spray (has no administration in time range)  0.9 %  sodium chloride  infusion (Manually program via Guardrails IV Fluids) ( Intravenous New Bag/Given 08/17/24 1339)    Clinical Course as of 08/17/24 1550  Mon Aug 17, 2024  1509 S. Admit. Asymptomatic  Hg of 7.4, transfusion. Handoff given [SA]    Clinical Course User Index [SA] Billy Pal, MD                                 Medical Decision Making Patient is a 68 year old female with past medical history of A-fib on Eliquis  and CHF presenting for low hemoglobin.  Patient has a history of iron deficiency anemia, thalassemia, and multiple previous occult GI bleeds.  Patient without melena today.  Exam reassuring and without abdominal tenderness and Hemoccult negative.  Differential diagnosis includes but not limited to GI bleed, iron deficiency, anemia chronic disease  Lab revealed hemoglobin 7.4 which is downtrending from last week and is down from her baseline.  Given patient's significant cardiac comorbidities, patient was transfused 1 unit PRBCs with a hemoglobin goal of greater than 8.  Otherwise, labs reassuring without leukocytosis, significant left abnormalities, and creatinine consistent patient's baseline.  Given patient's significant past history of occult GI bleeds,  anticoagulation on Eliquis , and downtrending hemoglobin patient requires admission for blood transfusion and serial hemoglobin monitoring.  Hospitalist was consulted who agrees to admit this patient.  Patient was admitted to their service in stable condition.  Please see admitting providers note for remainder patient's care.  Amount and/or Complexity of Data Reviewed External Data Reviewed: labs and notes. Labs: ordered.  Risk Prescription drug management. Decision regarding hospitalization.        Final diagnoses:  Anemia, unspecified type    ED Discharge Orders     None          Sharlet Dowdy, MD 08/17/24 1550    Lenor Hollering, MD 08/18/24 [phone]

## 2024-08-17 NOTE — ED Triage Notes (Signed)
 Pt send by PCP for low hemoglobin

## 2024-08-18 DIAGNOSIS — D509 Iron deficiency anemia, unspecified: Secondary | ICD-10-CM | POA: Diagnosis not present

## 2024-08-18 LAB — GLUCOSE, CAPILLARY
Glucose-Capillary: 144 mg/dL — ABNORMAL HIGH (ref 70–99)
Glucose-Capillary: 193 mg/dL — ABNORMAL HIGH (ref 70–99)

## 2024-08-18 LAB — BASIC METABOLIC PANEL WITH GFR
Anion gap: 8 (ref 5–15)
BUN: 15 mg/dL (ref 8–23)
CO2: 32 mmol/L (ref 22–32)
Calcium: 9.2 mg/dL (ref 8.9–10.3)
Chloride: 95 mmol/L — ABNORMAL LOW (ref 98–111)
Creatinine, Ser: 1.12 mg/dL — ABNORMAL HIGH (ref 0.44–1.00)
GFR, Estimated: 53 mL/min — ABNORMAL LOW (ref >60)
Glucose, Bld: 174 mg/dL — ABNORMAL HIGH (ref 70–99)
Potassium: 4.4 mmol/L (ref 3.5–5.1)
Sodium: 135 mmol/L (ref 135–145)

## 2024-08-18 LAB — TYPE AND SCREEN
ABO/RH(D): O POS
Antibody Screen: NEGATIVE
Unit division: 0

## 2024-08-18 LAB — CBC
HCT: 28 % — ABNORMAL LOW (ref 36.0–46.0)
Hemoglobin: 8.3 g/dL — ABNORMAL LOW (ref 12.0–15.0)
MCH: 20 pg — ABNORMAL LOW (ref 26.0–34.0)
MCHC: 29.6 g/dL — ABNORMAL LOW (ref 30.0–36.0)
MCV: 67.3 fL — ABNORMAL LOW (ref 80.0–100.0)
Platelets: 265 K/uL (ref 150–400)
RBC: 4.16 MIL/uL (ref 3.87–5.11)
RDW: 20 % — ABNORMAL HIGH (ref 11.5–15.5)
WBC: 8.3 K/uL (ref 4.0–10.5)
nRBC: 0 % (ref 0.0–0.2)

## 2024-08-18 LAB — BPAM RBC
Blood Product Expiration Date: 202511262359
ISSUE DATE / TIME: 202511031320
Unit Type and Rh: 5100

## 2024-08-18 MED ORDER — FERROUS SULFATE 325 (65 FE) MG PO TBEC
325.0000 mg | DELAYED_RELEASE_TABLET | Freq: Every day | ORAL | 3 refills | Status: AC
Start: 2024-08-18 — End: 2025-08-18

## 2024-08-18 MED ORDER — OXYMETAZOLINE HCL 0.05 % NA SOLN: 2.0000 | NASAL | 2 refills | Status: DC | PRN

## 2024-08-18 MED ORDER — FOLIC ACID 1 MG PO TABS: 1.0000 mg | ORAL_TABLET | Freq: Every day | ORAL | 0 refills | Status: DC

## 2024-08-18 MED ORDER — VITAMIN B-12 1000 MCG PO TABS: 1000.0000 ug | ORAL_TABLET | Freq: Every day | ORAL | 0 refills | Status: AC

## 2024-08-18 NOTE — Plan of Care (Signed)
  Problem: Fluid Volume: Goal: Ability to maintain a balanced intake and output will improve 08/18/2024 0533 by Rosaura Marelyn POUR, RN Outcome: Progressing 08/18/2024 0532 by Rosaura Marelyn POUR, RN Outcome: Progressing

## 2024-08-18 NOTE — Discharge Summary (Signed)
 Physician Discharge Summary   Patient: Lynn [name] MRN: [id number] DOB: [birth year]  Admit date:     08/17/2024  Discharge date: 08/18/24  Discharge Physician: Yetta Blanch  PCP: Watt [name], MD  Recommendations at discharge: Follow-up with PCP in 1 week. Repeat CBC and BMP in 1 week. Follow-up with cardiology as recommended. Follow-up in ENT.   Follow-up Information     Copland, [name], MD. Schedule an appointment as soon as possible for a visit in 1 week(s).   Specialty: Family Medicine Why: with BMP lab to look at kidney/electrolyte numbers, with CBC lab to look at blood counts Contact information: [address] [place] [address] [phone]         [name], MD. Go today.   Specialty: Cardiology Why: the Appointment As Scheduled Contact information: [address] [place] [address] [phone]         Luciano Standing, MD. Schedule an appointment as soon as possible for a visit in 2 week(s).   Specialty: Otolaryngology Why: To Establish Care Contact information: [address] [place] [address] [phone]                Hospital Course: Patient with PMH of HTN, HLD, chronic HFpEF, CAD, PAF, chronic respiratory failure due to COPD, present to the hospital with anemia. Received 1 PRBC transfusion. H&H appropriately stable. No further bleeding seen. Recommendation is to stop Eliquis  for now until seen by cardiology and ENT.  Assessment and plan. Microcytic anemia due to thalassemia Recurrent epistaxis. Patient presents with complaints of abnormal blood work. Went to see her PCP and her hemoglobin was 7.4. Hemoglobin 7.4 here as well. Baseline appears to be around 8. Received 1 PRBC transfusion. In the past had relatively low B12 and folic acid  level. Iron level is normal. Her microcytosis secondary to alpha thalassemia. At present we will provide supplementation and  recommend follow-up with PCP with repeat CBC in 1 week. No active bleeding seen. Patient reports that she has history of epistaxis and recently has undergone frequent episodes of epistaxis which led to probable anemia. Denies having any GI bleed or blood in the stool or melena or dark stools or diarrhea. No abdominal symptoms reported by the patient's as well. For now I will be holding her Eliquis . This was discussed with cardiology. Close follow-up with cardiology and ENT recommended.   CAD Patient reports having intermittent chest pains for which she is followed by cardiology.  Prior workup included elevated coronary Calcium  Score with significant plaque burden and moderate 40 to 50% stenosis with CT angiogram suggested 70% ostial D2 disease.  Plan had been for patient to have cardiac catheterization on 11/5 but had been canceled due to anemia. - Recommend outpatient follow-up with cardiology to reschedule   Paroxysmal atrial fibrillation on chronic anticoagulation Patient appears to be in a sinus rhythm at this time.  She had been holding anticoagulation over the last 3 days in preparation for cardiac catheterization which has now been canceled. - Continue Cardizem  Holding Eliquis  for now.   Controlled diabetes mellitus type 2, with long-term use of insulin  Last hemoglobin A1c noted to be 6.3 when checked on 07/22/2024.  Home medication regimen includes Mounjaro .  Patient wears an insulin  pump, but is in need of refills. Resuming home medication.   Chronic respiratory failure with hypoxia COPD Interstitial lung disease Patient is on 3 L of oxygen  at home. - Continue nasal cannula oxygen  to  maintain O2 saturation - Continue albuterol  nebs as needed for shortness of breath/wheezing - Continue outpatient follow-up with pulmonology   Diastolic congestive heart failure Patient appears relatively euvolemic at this time.  Last echocardiogram noted EF to be 60 to 65% with grade 1 diastolic  dysfunction. - Continue goal-directed medical therapy as tolerated   Essential hypertension Blood pressures noted to be elevated up to 157/77. - Continue Cardizem , Imdur  furosemide , spironolactone , and losartan    Dyslipidemia - Continue atorvastatin    Morbid obesity BMI 44.64 kg/m Placing the patient at high risk for poor outcome.  Consultants:  None  Procedures performed:  Blood transfusion  DISCHARGE MEDICATION: Allergies as of 08/18/2024       Reactions   Methotrexate Other (See Comments)   Pneumonitis   Penicillins Hives   Childhood allergy   Farxiga  [dapagliflozin ] Other (See Comments)   Dizzy and lethargic   Metformin  And Related Diarrhea, Other (See Comments)   Also, bleeding        Medication List     PAUSE taking these medications    apixaban  5 MG Tabs tablet Wait to take this until your doctor or other care provider tells you to start again. Commonly known as: ELIQUIS  Take 1 tablet (5 mg total) by mouth 2 (two) times daily.       TAKE these medications    acetaminophen  500 MG tablet Commonly known as: TYLENOL  Take 1,000 mg by mouth every 6 (six) hours as needed (for pain).   albuterol  (2.5 MG/3ML) 0.083% nebulizer solution Commonly known as: PROVENTIL  INHALE 3 ML BY NEBULIZATION EVERY 6 HOURS AS NEEDED FOR WHEEZING OR SHORTNESS OF BREATH   albuterol  108 (90 Base) MCG/ACT inhaler Commonly known as: VENTOLIN  HFA TAKE 2 PUFFS BY MOUTH EVERY 6 HOURS AS NEEDED FOR WHEEZE OR SHORTNESS OF BREATH   ALPRAZolam  0.25 MG tablet Commonly known as: XANAX  Take 1 tablet (0.25 mg total) by mouth 2 (two) times daily as needed for anxiety. TAKE 1-2 TABLETS BY MOUTH 2 TIMES DAILY AS NEEDED FOR ANXIETY OR INSOMNIA   atorvastatin  80 MG tablet Commonly known as: LIPITOR  Take 1 tablet (80 mg total) by mouth every evening.   cetirizine 10 MG tablet Commonly known as: ZYRTEC Take 10 mg by mouth daily as needed for allergies.   Contour Next Test test  strip Generic drug: glucose blood 3x daily   cyanocobalamin  1000 MCG tablet Commonly known as: VITAMIN B12 Take 1 tablet (1,000 mcg total) by mouth daily.   Dexcom G7 Sensor Misc 1 Device by Does not apply route as directed.   diltiazem  360 MG 24 hr capsule Commonly known as: CARDIZEM  CD TAKE 1 CAPSULE BY MOUTH EVERY DAY   ferrous sulfate  325 (65 FE) MG EC tablet Take 1 tablet (325 mg total) by mouth daily with breakfast.   folic acid  1 MG tablet Commonly known as: FOLVITE  Take 1 tablet (1 mg total) by mouth daily. What changed: how much to take   furosemide  40 MG tablet Commonly known as: LASIX  Take 40 mg by mouth in the morning and an additional 40 mg for an overnight weight gain of 3 pounds or 5 pounds in a week   hydroxychloroquine  200 MG tablet Commonly known as: PLAQUENIL  Take 200 mg by mouth 2 (two) times daily.   insulin  lispro 100 UNIT/ML injection Commonly known as: HUMALOG  Max Daily 70 units per pump   isosorbide  mononitrate 30 MG 24 hr tablet Commonly known as: IMDUR  Take 1 tablet (30 mg total) by  mouth daily.   Lancets 30G Misc 1 Device by Does not apply route 3 (three) times daily.   losartan  100 MG tablet Commonly known as: COZAAR  Take 0.5 tablets (50 mg total) by mouth daily.   Melatonin 5 MG Chew Chew 10 mg by mouth at bedtime.   multivitamin with minerals Tabs tablet Take 1 tablet by mouth daily with breakfast.   Omnipod 5 G7 Pods (Gen 5) Misc 1 Device by Other route every other day.   oxymetazoline  0.05 % nasal spray Commonly known as: AFRIN Place 2 sprays into both nostrils every 4 (four) hours as needed (for nose bleed, apply spray and hold pressure.).   pantoprazole  40 MG tablet Commonly known as: PROTONIX  Take 1 tablet (40 mg total) by mouth daily before breakfast.   SAFETY-LOK TB SYRINGE 27GX.5 27G X 1/2 1 ML Misc Generic drug: TUBERCULIN SYR 1CC/27GX1/2   sodium chloride  0.65 % Soln nasal spray Commonly known as:  OCEAN Place 1 spray into both nostrils as needed.   spironolactone  25 MG tablet Commonly known as: ALDACTONE  Take 1 tablet (25 mg total) by mouth daily.   tirzepatide  15 MG/0.5ML Pen Commonly known as: MOUNJARO  Inject 15 mg into the skin once a week.   traZODone  50 MG tablet Commonly known as: DESYREL  Take 0.5-1 tablets (25-50 mg total) by mouth at bedtime as needed for sleep. Needs appt   VICKS SINEX NA Place 1 spray into both nostrils every 12 (twelve) hours as needed (for congestion- MAX OF THREE CONSECUTIVE DAYS).       Disposition: Home Diet recommendation: Cardiac diet  Discharge Exam: Vitals:   08/17/24 2021 08/18/24 0447 08/18/24 0831 08/18/24 0836  BP: (!) 147/64 (!) 115/56 (!) 115/52 (!) 115/52  Pulse: 65 68  71  Resp: 19   18  Temp: 97.7 F (36.5 C)   98.3 F (36.8 C)  TempSrc:    Oral  SpO2: 98% 100%  97%   Clear to auscultation. S1-S2 present Bowel sound present No edema. Chronic oxygen .  There were no vitals filed for this visit. Condition at discharge: stable  The results of significant diagnostics from this hospitalization (including imaging, microbiology, ancillary and laboratory) are listed below for reference.   Imaging Studies: MM 3D SCREENING MAMMOGRAM BILATERAL BREAST Result Date: 07/30/2024 CLINICAL DATA:  Screening. EXAM: DIGITAL SCREENING BILATERAL MAMMOGRAM WITH TOMOSYNTHESIS AND CAD TECHNIQUE: Bilateral screening digital craniocaudal and mediolateral oblique mammograms were obtained. Bilateral screening digital breast tomosynthesis was performed. The images were evaluated with computer-aided detection. COMPARISON:  Previous exam(s). ACR Breast Density Category a: The breasts are almost entirely fatty. FINDINGS: There are no findings suspicious for malignancy. IMPRESSION: No mammographic evidence of malignancy. A result letter of this screening mammogram will be mailed directly to the patient. RECOMMENDATION: Screening mammogram in one year.  (Code:SM-B-01Y) BI-RADS CATEGORY  1: Negative. Electronically Signed   By: Corean Salter M.D.   On: 07/30/2024 14:53   DG Bone Density Result Date: 07/29/2024 EXAM: DUAL X-RAY ABSORPTIOMETRY (DXA) FOR BONE MINERAL DENSITY 07/28/2024 1:34 pm CLINICAL DATA:  67 year old Female Postmenopausal. Estrogen deficiency Patient is or has been on glucocorticoid therapy. Patient is or has been on bone building therapies. TECHNIQUE: An axial (e.g., hips, spine) and/or appendicular (e.g., radius) exam was performed, as appropriate, using GE Secretary/administrator at [facility]. Images are obtained for bone mineral density measurement and are not obtained for diagnostic purposes. [id number] Exclusions: None. COMPARISON:  None. New baseline. FINDINGS: Scan  quality: Good. LUMBAR SPINE (L1-L4): BMD (in g/cm2): 1.260 T-score: 0.7 Z-score: 2.3 LEFT FEMORAL NECK: BMD (in g/cm2): 0.966 T-score: -0.5 Z-score: 1.2 LEFT TOTAL HIP: BMD (in g/cm2): 1.006 T-score: 0.0 Z-score: 1.5 RIGHT FEMORAL NECK: BMD (in g/cm2): 1.026 T-score: -0.1 Z-score: 1.6 RIGHT TOTAL HIP: BMD (in g/cm2): 1.022 T-score: 0.1 Z-score: 1.6 FRAX 10-YEAR PROBABILITY OF FRACTURE: FRAX not reported as the lowest BMD is not in the osteopenia range. IMPRESSION: Normal based on BMD. Fracture risk is unknown due to history of bone building therapy. RECOMMENDATIONS: 1. All patients should optimize calcium  and vitamin D  intake. 2. Consider FDA-approved medical therapies in postmenopausal women and men aged 102 years and older, based on the following: - A hip or vertebral (clinical or morphometric) fracture - T-score less than or equal to -2.5 and secondary causes have been excluded. - Low bone mass (T-score between -1.0 and -2.5) and a 10-year probability of a hip fracture greater than or equal to 3% or a 10-year probability of a major osteoporosis-related fracture greater than or equal to 20% based on the [place] -adapted WHO algorithm. - Clinician  judgment and/or patient preferences may indicate treatment for people with 10-year fracture probabilities above or below these levels 3. Patients with diagnosis of osteoporosis or at high risk for fracture should have regular bone mineral density tests. For patients eligible for Medicare, routine testing is allowed once every 2 years. The testing frequency can be increased to one year for patients who have rapidly progressing disease, those who are receiving or discontinuing medical therapy to restore bone mass, or have additional risk factors. Electronically Signed   By: Dina  Arceo M.D.   On: 07/29/2024 04:58    Microbiology: Results for orders placed or performed during the hospital encounter of 11/13/21  Resp Panel by RT-PCR (Flu A&B, Covid) Nasopharyngeal Swab     Status: None   Collection Time: 11/13/21 10:00 PM   Specimen: Nasopharyngeal Swab; Nasopharyngeal(NP) swabs in vial transport medium  Result Value Ref Range Status   SARS Coronavirus 2 by RT PCR NEGATIVE NEGATIVE Final    Comment: (NOTE) SARS-CoV-2 target nucleic acids are NOT DETECTED.  The SARS-CoV-2 RNA is generally detectable in upper respiratory specimens during the acute phase of infection. The lowest concentration of SARS-CoV-2 viral copies this assay can detect is 138 copies/mL. A negative result does not preclude SARS-Cov-2 infection and should not be used as the sole basis for treatment or other patient management decisions. A negative result may occur with  improper specimen collection/handling, submission of specimen other than nasopharyngeal swab, presence of viral mutation(s) within the areas targeted by this assay, and inadequate number of viral copies(<138 copies/mL). A negative result must be combined with clinical observations, patient history, and epidemiological information. The expected result is Negative.  Fact Sheet for Patients:  bloggercourse.com  Fact Sheet for Healthcare  Providers:  seriousbroker.it  This test is no t yet approved or cleared by the [place]  FDA and  has been authorized for detection and/or diagnosis of SARS-CoV-2 by FDA under an Emergency Use Authorization (EUA). This EUA will remain  in effect (meaning this test can be used) for the duration of the COVID-19 declaration under Section 564(b)(1) of the Act, 21 U.S.C.section 360bbb-3(b)(1), unless the authorization is terminated  or revoked sooner.       Influenza A by PCR NEGATIVE NEGATIVE Final   Influenza B by PCR NEGATIVE NEGATIVE Final    Comment: (NOTE) The Xpert Xpress SARS-CoV-2/FLU/RSV plus assay is intended as  an aid in the diagnosis of influenza from Nasopharyngeal swab specimens and should not be used as a sole basis for treatment. Nasal washings and aspirates are unacceptable for Xpert Xpress SARS-CoV-2/FLU/RSV testing.  Fact Sheet for Patients: bloggercourse.com  Fact Sheet for Healthcare Providers: seriousbroker.it  This test is not yet approved or cleared by the [place]  FDA and has been authorized for detection and/or diagnosis of SARS-CoV-2 by FDA under an Emergency Use Authorization (EUA). This EUA will remain in effect (meaning this test can be used) for the duration of the COVID-19 declaration under Section 564(b)(1) of the Act, 21 U.S.C. section 360bbb-3(b)(1), unless the authorization is terminated or revoked.  Performed at [facility], [address], [place], [place] [address]    Labs: CBC: Recent Labs  Lab 08/14/24 1629 08/17/24 1115 08/18/24 0510  WBC 7.3 7.8 8.3  HGB 7.6* 7.4* 8.3*  HCT 27.8* 26.4* 28.0*  MCV 67* 65.0* 67.3*  PLT 312 277 265   Basic Metabolic Panel: Recent Labs  Lab 08/14/24 1629 08/17/24 1115 08/18/24 0510  NA 137 136 135  K 4.9 4.4 4.4  CL 96 96* 95*  CO2 27 29 32  GLUCOSE 143* 176* 174*  BUN 14 17 15    CREATININE 1.23* 1.16* 1.12*  CALCIUM  9.4 9.1 9.2   Liver Function Tests: Recent Labs  Lab 08/17/24 1115  AST 18  ALT 17  ALKPHOS 85  BILITOT 0.2  PROT 7.7  ALBUMIN 3.7   CBG: Recent Labs  Lab 08/17/24 2023 08/18/24 0742 08/18/24 1153  GLUCAP 152* 144* 193*    Discharge time spent: greater than 30 minutes.  Author: Yetta Blanch, MD  [facility] 08/18/2024

## 2024-08-18 NOTE — Care Management Obs Status (Signed)
 MEDICARE OBSERVATION STATUS NOTIFICATION   Patient Details  Name: Lynn [name] MRN: [id number] Date of Birth: Mar 28, 1956   Medicare Observation Status Notification Given:  Yes  Verbally reviewed observation notice with Avelina Louder  telephonically at [phone].  Copy will be delivered to the patients room.    Jillyn Stacey 08/18/2024, 11:43 AM

## 2024-08-18 NOTE — TOC Transition Note (Signed)
 Transition of Care [facility]) - Discharge Note   Patient Details  Name: Lynn [name] MRN: [id number] Date of Birth: [birth year]  [facility]) CM/SW Contact:  Tom-Grimmer, Harvest Muskrat, RN Phone Number: 08/18/2024, 12:52 PM   Clinical Narrative:     Patient is scheduled for discharge today.  Outpatient f/u, hospital f/u and discharge instructions on AVS. No ICM needs or recommendations noted. Husband, Gretel to transport at discharge.  No further ICM needs noted.      Final next level of care: Home/Self Care Barriers to Discharge: Barriers Resolved   Patient Goals and CMS Choice Patient states their goals for this hospitalization and ongoing recovery are:: To return home CMS Medicare.gov Compare Post Acute Care list provided to:: Patient Choice offered to / list presented to : NA      Discharge Placement                Patient to be transferred to facility by: Husband Name of family member notified: Gretel    Discharge Plan and Services Additional resources added to the After Visit Summary for                  DME Arranged: N/A DME Agency: NA       HH Arranged: NA HH Agency: NA        Social Drivers of Health (SDOH) Interventions SDOH Screenings   Food Insecurity: No Food Insecurity (08/17/2024)  Housing: Low Risk  (08/17/2024)  Transportation Needs: No Transportation Needs (08/17/2024)  Utilities: Not At Risk (08/17/2024)  Alcohol Screen: Low Risk  (06/23/2024)  Depression (PHQ2-9): Low Risk  (06/23/2024)  Financial Resource Strain: Low Risk  (06/23/2024)  Physical Activity: Inactive (06/23/2024)  Social Connections: Moderately Isolated (08/17/2024)  Stress: No Stress Concern Present (06/23/2024)  Recent Concern: Stress - Stress Concern Present (06/10/2024)  Tobacco Use: Medium Risk (08/17/2024)  Health Literacy: Adequate Health Literacy (06/23/2024)     Readmission Risk Interventions    07/18/2023    3:38 PM  Readmission Risk Prevention  Plan  Transportation Screening Complete  PCP or Specialist Appt within 5-7 Days Complete  Home Care Screening Complete  Medication Review (RN CM) Complete

## 2024-08-19 ENCOUNTER — Encounter (HOSPITAL_COMMUNITY): Admission: RE | Payer: Self-pay | Source: Home / Self Care

## 2024-08-19 ENCOUNTER — Telehealth: Payer: Self-pay

## 2024-08-19 ENCOUNTER — Ambulatory Visit (HOSPITAL_COMMUNITY): Admission: RE | Admit: 2024-08-19 | Source: Home / Self Care | Admitting: Cardiology

## 2024-08-19 SURGERY — RIGHT/LEFT HEART CATH AND CORONARY ANGIOGRAPHY
Anesthesia: LOCAL

## 2024-08-19 NOTE — Transitions of Care (Post Inpatient/ED Visit) (Signed)
 08/19/2024  Name: Lynn [name] MRN: [id number] DOB: [birth year]  Today's TOC FU Call Status: Today's TOC FU Call Status:: Successful TOC FU Call Completed TOC FU Call Complete Date: 08/19/24 Patient's Name and Date of Birth confirmed.  Transition Care Management Follow-up Telephone Call Date of Discharge: 08/18/24 Discharge Facility: [facility]) Type of Discharge: Inpatient Admission Primary Inpatient Discharge Diagnosis:: Microcytic Anemia How have you been since you were released from the hospital?: Better Any questions or concerns?: No  Items Reviewed: Did you receive and understand the discharge instructions provided?: Yes Medications obtained,verified, and reconciled?: Yes (Medications Reviewed) Any new allergies since your discharge?: No Dietary orders reviewed?: NA Do you have support at home?: Yes People in Home [RPT]: spouse  Medications Reviewed Today: Medications Reviewed Today     Reviewed by Lavelle Charmaine NOVAK, LPN (Licensed Practical Nurse) on 08/19/24 at 1341  Med List Status: <None>   Medication Order Taking? Sig Documenting Provider Last Dose Status Informant  acetaminophen  (TYLENOL ) 500 MG tablet [id number]  Take 1,000 mg by mouth every 6 (six) hours as needed (for pain). [provider]  Active Self  albuterol  (PROVENTIL ) (2.5 MG/3ML) 0.083% nebulizer solution [id number]  INHALE 3 ML BY NEBULIZATION EVERY 6 HOURS AS NEEDED FOR WHEEZING OR SHORTNESS OF BREATH Cobb, Comer GAILS, NP  Active   albuterol  (VENTOLIN  HFA) 108 (90 Base) MCG/ACT inhaler [id number]  TAKE 2 PUFFS BY MOUTH EVERY 6 HOURS AS NEEDED FOR WHEEZE OR SHORTNESS OF [name], MD  Active   ALPRAZolam  (XANAX ) 0.25 MG tablet [id number]  Take 1 tablet (0.25 mg total) by mouth 2 (two) times daily as needed for anxiety. TAKE 1-2 TABLETS BY MOUTH 2 TIMES DAILY AS NEEDED FOR ANXIETY OR INSOMNIA Copland, [name], MD  Active   apixaban  (ELIQUIS ) 5 MG TABS tablet [id number]   Take 1 tablet (5 mg total) by mouth 2 (two) times daily. Meng, Hao, [place]  Active   atorvastatin  (LIPITOR ) 80 MG tablet [id number]  Take 1 tablet (80 mg total) by mouth every evening. Meng, Hao, [place]  Active   cetirizine (ZYRTEC) 10 MG tablet [id number]  Take 10 mg by mouth daily as needed for allergies. [provider]  Active Self  Continuous Glucose Sensor (DEXCOM G7 SENSOR) MISC [id number]  1 Device by Does not apply route as directed. [name], Donell Cardinal, MD  Active   cyanocobalamin  (VITAMIN B12) 1000 MCG tablet [id number]  Take 1 tablet (1,000 mcg total) by mouth daily. [name], MD  Active   diltiazem  (CARDIZEM  CD) 360 MG 24 hr capsule [id number]  TAKE 1 CAPSULE BY MOUTH EVERY DAY Meng, Hao, [place]  Active   ferrous sulfate  325 (65 FE) MG EC tablet [id number]  Take 1 tablet (325 mg total) by mouth daily with breakfast. [name], MD  Active   folic acid  (FOLVITE ) 1 MG tablet [id number]  Take 1 tablet (1 mg total) by mouth daily. [name], MD  Active   furosemide  (LASIX ) 40 MG tablet [id number]  Take 40 mg by mouth in the morning and an additional 40 mg for an overnight weight gain of 3 pounds or 5 pounds in a week [name], MD  Active   glucose blood (CONTOUR NEXT TEST) test strip [id number]  3x daily [name], [name], MD  Active Self  hydroxychloroquine  (PLAQUENIL ) 200 MG tablet [id number]  Take 200 mg by mouth 2 (two) times daily. [provider]  Active Self  Insulin  Disposable Pump (OMNIPOD  5 G7 PODS, GEN 5,) MISC [id number]  1 Device by Other route every other day. [name], [name], MD  Active   insulin  lispro (HUMALOG ) 100 UNIT/ML injection [id number]  Max Daily 70 units per pump [name], [name], MD  Active   isosorbide  mononitrate (IMDUR ) 30 MG 24 hr tablet [id number]  Take 1 tablet (30 mg total) by mouth daily. [name], [place]  Active   Lancets 30G [place] [id number]  1 Device by Does not apply route 3 (three) times daily.  Copland, [name], MD  Active   losartan  (COZAAR ) 100 MG tablet [id number]  Take 0.5 tablets (50 mg total) by mouth daily. [name], MD  Active   Melatonin 5 MG CHEW [id number]  Chew 10 mg by mouth at bedtime. [provider]  Active Self  Multiple Vitamin (MULTIVITAMIN WITH MINERALS) TABS tablet [id number]  Take 1 tablet by mouth daily with breakfast. [provider]  Active Self           Med Note LEILANI, [facility]   Tue Aug 09, 2020  2:44 PM)    oxymetazoline  (AFRIN) 0.05 % nasal spray [id number]  Place 2 sprays into both nostrils every 4 (four) hours as needed (for nose bleed, apply spray and hold pressure.). [name], MD  Active   Oxymetazoline  HCl SANDRALEE [place] NA) [id number]  Place 1 spray into both nostrils every 12 (twelve) hours as needed (for congestion- MAX OF THREE CONSECUTIVE DAYS). [provider]  Active Self  pantoprazole  (PROTONIX ) 40 MG tablet [id number]  Take 1 tablet (40 mg total) by mouth daily before breakfast. Zehr, Jessica D, PA-C  Active   SAFETY-LOK TB SYRINGE 27GX.5 27G X 1/2 1 ML MISC [id number]   [provider]  Active   sodium chloride  (OCEAN) 0.65 % SOLN nasal spray [id number]  Place 1 spray into both nostrils as needed. Soldatova, [place], MD  Active Self  spironolactone  (ALDACTONE ) 25 MG tablet [id number]  Take 1 tablet (25 mg total) by mouth daily. [name], MD  Active   tirzepatide  (MOUNJARO ) 15 MG/0.5ML Pen [id number]  Inject 15 mg into the skin once a week. [name], [name], MD  Active   traZODone  (DESYREL ) 50 MG tablet [id number]  Take 0.5-1 tablets (25-50 mg total) by mouth at bedtime as needed for sleep. Needs appt Copland, [name], MD  Active             Home Care and Equipment/Supplies: Were Home Health Services Ordered?: NA Any new equipment or medical supplies ordered?: NA  Functional Questionnaire: Do you need assistance with bathing/showering or dressing?: No Do you need assistance  with meal preparation?: No Do you need assistance with eating?: No Do you have difficulty maintaining continence: No Do you need assistance with getting out of bed/getting out of a chair/moving?: No Do you have difficulty managing or taking your medications?: No  Follow up appointments reviewed: PCP Follow-up appointment confirmed?: Yes Date of PCP follow-up appointment?: 08/24/24 Follow-up Provider: [facility] Follow-up appointment confirmed?: Yes Date of Specialist follow-up appointment?: 08/27/24 Follow-Up Specialty Provider:: cardiology Do you need transportation to your follow-up appointment?: No Do you understand care options if your condition(s) worsen?: Yes-patient verbalized understanding    SIGNATURE Charmaine Bloodgood, LPN [facility] Advisor [place] [facility] You Are. We Are. [facility] Direct Dial  [phone]

## 2024-08-22 NOTE — H&P (View-Only) (Signed)
 Cardiology Office Note:    Date:  08/22/2024   ID:  Lynn [name], DOB May 20, 1956, MRN [id number]  PCP:  Watt [name], MD  Cardiologist:  Alm Clay, MD Cardiology APP:  Madie Jon Garre, PA { Click to update primary MD,subspecialty MD or APP then REFRESH:1}    Referring MD: Copland, [name], MD   Chief Complaint: follow-up to discuss timing of cardiac catheterization   History of Present Illness:    Lynn [name] is a 68 y.o. female with a history of mild non-obstructive CAD on cardiac catheterization in 07/2021, coronary vasospasms, chronic diastolic CHF, paroxysmal atrial fibrillation on Eliquis , paroxysmal atrial tachycardia, moderate mitral stenosis, interstitial lung disease with chronic hypoxic respiratory failure, hypertension, hyperlipidemia, type 2 diabetes mellitus, CVA, GERD, recurrent GI bleeding from gastric ulcer and AVMs s/p multiple transfusions in the past, microcytic anemia due to thalassemia, obstructive sleep apnea, rheumatoid arthritis, and anxiety/ depression who is followed by Dr. Clay and presents today to discuss timing of cardiac catheterization.   Patient has a complex history as detailed above and has had numerous hospitalizations over the years for a variety of issues. She was admitted for chest pain in 11/2016 and ruled in for NSTEMI. LHC showed non-obstructive CAD but she was started on Imdur  for possible coronary vasospasms. During an admission in 03/2021, TTE showed LVEF of 60-65% with moderate LVH of the basal-septal segment and grade 2 diastolic dysfunction, normal RV function, moderate mitral stenosis (mean gradient 9 mmHg), and an ill defined density originating off the posterior mitral valve leaflet. This led to a TEE which showed a large circumscribed mass adherent to the posterior annulus of the mitral valve with moderate to severe mitral stenosis. Cardiac MRI was ordered for further evaluation and showed normal LV  function with posterolateral mitral annulus mass consistent with degenerative mitral annular calcification. Monitor in 05/2021 showed 1% atrial fibrillation (with the longest episode lasting about 1 hour), 30 brief episodes of SVT/ atrial tachycardia, and 2 short runs of NSVT.  R/ LHC in 07/2021 showed mild non-obstructive CAD, mild pulmonary venous hypertension (PA 31 mmHg with a PCWP and LVEDP of 23 mmHg), and preserved cardiac output. Patient was admitted in 10/2021 for syncope. Echo showed LVEF of 60-65% with severe LVH and grade 1 diastolic dysfunction, normal RV function, severe MAC with moderate to severe mitral stenosis (mean gradient 7.5 mmHg). Syncope was felt to likely be vasovagal in nature. Outpatient monitor showed 2 short runs of NSVT (longest run 12 beat), 43 atrial runs (longest run 13 beats), frequent PACs (8.4%), and rare PVCs.   She was referred to [facility] CT Surgery in 01/2024 for further evaluation of mitral valve disease. Echo there showed LVEF of >55 (3D EF 69%) with grade 2 diastolic dysfunction, normal RV function, and severe MAC with moderate mitral stenosis (mean gradient 11 mmHg). Coronary CTA showed moderate CAD with most significant lesions being a 70% stenosis in the proximal RCA and >70% stenosis of D2 and then 50% stenosis of mid LAD and proximal LCX as well as an enlarged left atrium with extensive nodular MAC and small size mitral annulus. l Dr. Laurence recommended continue medical management for now as surgery would be considered high risk given her her multiple comorbidities. However, Dr. Laurence would consider mitral valve surgical replacement in the future if unable to manage worsening CHF symptoms.   Patient was last seen by Dr. Clay on 08/14/2024 at which time she reported chest pain and shortness of breath,  particularly with exertion. She described the chest pain as as a heart pain located on the left side accompanied by palpitations and fluttering. R/ LHC was recommended with  plans to measure coronary flow reserve to assess for small vessel disease. This was initially scheduled for 08/19/2024. However, pre-procedural  labs showed worsening anemia with hemoglobin of 7.6. Cath was cancelled and she was advised to go to the ED for further evaluation of this.   She was admitted from 08/17/2024 to 08/18/2024 for worsening anemia. Hemoglobin was as low as 7.4. She was treated with 1 unit of PRBCs. Hemoccult was negative. She reported recurrent epistaxis lately and this was felt to be the possible cause. Eliquis  was held and she was advised to follow-up with ENT.   Patient presents today for follow-up. ***  Chest Pain Non-Obstructive CAD LHC in 07/2021 showed mild non-obstructive CAD. Coronary CTA in showed 01/2024 at [facility] moderate CAD with most significant lesions being a 70% stenosis in the proximal RCA and >70% stenosis of D2 and then 50% stenosis of mid LAD and proximal LCX. Patient reported exertional chest pain and shortness of breath at last visit in 07/2024. R/ LHC was arranged and initially scheduled for 08/19/2024. However, this was cancelled when pre-procedural labs showed worsening anemia.  - *** - Continue Imdur  30mg  daily.  - Not on Aspirin  given DOAC.  - Continue Lipitor  80mg  daily.   Chronic Diastolic CHF Last Echo in 01/2024 at [facility] showed LVEF of  >55 (3D EF 69%) with grade 2 diastolic dysfunction as well as normal RV function. - *** - Continue Lasix  40mg  daily. Can continue to take an extra dose as needed for weight gain.  - Continue Spironolactone  25mg  daily.  - Intolerant to Farxiga  in the past.  - Continue daily weights and sodium/ fluid restrictions.   Paroxysmal Atrial Fibrillation Paroxysmal Atrial Tachycardia Monitor in 05/2021 showed 1% atrial fibrillation burden and 30 short runs of SVT/ PAT. Repeat monitor in 11/2021 showed short atrial runs but no atrial fibrillation.  - *** - Continue Cardizem  CD 360mg  daily.  - Previously on Eliquis  5mg  twice  daily. However, this was recently held during recent admission for acute on chronic anemia felt to possible be due to recurrent epistaxis. ***  Moderate Mitral Stenosis Last Echo in 01/2024 at [facility] showed severe MAC with moderate aortic stenosis (mean gradient 11 mmHg).  - Continue routine monitoring with surveillance Echos per guidelines.   Hypertension BP *** - Continue current medications: Imdur  30mg  daily, Cardizem  CD 360mg  daily, Losartan  50mg  daily,  and Spironolactone  25mg  daily.   Hyperlipidemia Lipid panel in 06/2024: Total Cholesterol 118, Triglycerides 69, HDL 58.1, LDL 46.  - Continue Lipitor  80mg  daily.   Type 2 Diabetes Mellitus Hemoglobin A1c 6.3% in 102025.  - On Insulin .  - Management per PCP.  CKD Stage IIIa Baseline creatinine around 1.1 to 1.3.   Interstitial Lung Disease Chronic Hypoxic Respiratory Failure  On *** L of O2. - Management per Pulmonology (Dr. Geronimo).  - RHC will help assess for pulmonary hypertension. Per Dr. Reeves last note, if there is no significant pulmonary hypertension, then he will consider immunomodulator or antifibrotic for worsening lung disease. ***  Obstructive Sleep Apnea - Continue CPAP.  - Followed by Pulmonology.   Chronic Anemia Thalassemia Epistaxis   History of GI Bleeds Patient was recently admitted in early 08/2024 for acute on chronic anemia. S/p 1 unit of PRBCs. Hemoccult was negative. Patient reported recurrent epistaxis which was felt to possibly  be the cause of her drop in hemoglobin. She was advised to follow-up with ENT.  - ***  EKGs/Labs/Other Studies Reviewed:    The following studies were reviewed today:  TEE 03/28/2021: Impressions: 1. Left ventricular ejection fraction, by estimation, is 55 to 60%. The  left ventricle has normal function. The left ventricle has no regional  wall motion abnormalities.   2. Right ventricular systolic function was not well visualized. The right  ventricular size  is not well visualized.   3. No left atrial/left atrial appendage thrombus was detected.   4. 3-D images of the mitral annular mass were obtained. There is a large  circumscribed mass adherent to the posterior annulus. The posterior  leaflet is fixed . There is moderate - severe mitral stenosis.      The mass is large , measuring 1.8 x 1.4 cm. Differential diagnosis  includes myxoma, exuberant mitral annular calcification, myxoma, or other  primary cardiac tumor. suggest Cardiac MRI for further evaluation . This  mass was present on transthoracic  echo from 2018. . The mitral valve is abnormal. Mild mitral valve  regurgitation. Moderate to severe mitral stenosis. Severe mitral annular  calcification.   5. The aortic valve is tricuspid. Aortic valve regurgitation is not  visualized. No aortic stenosis is present.   6. There is mild (Grade II) plaque.  _______________  Cardiac MRI 03/30/2021: Impressions: 1.  Normal right and left ventricular size and function, LVEF 53%. 2. Posterolateral mitral annulus mass consistent with degenerative mitral annular calcification (hypointense on T1, T2 and STIR sequences, no LGE). 3. I reviewed today's High resolution chest CT which confirmed calcification of mitral annular mass as above. 4.  No scar or late gadolinium enhancement noted on LV myocardium. _______________  Monitor 05/22/2021 to 06/12/2021: 2 separate monitors worn for total of 21 days. Overall predominantly sinus rhythm with minimum heart rate of 55 bpm, maximum 140 bpm and average of 81 bpm. Atrial fibrillation was seen with a 1% burden, longest duration was 1 hour 5 minutes. Heart rate ranged from 53 to 117 bpm. 30 episodes of brief supraventricular tachycardia/atrial tachycardia: Fastest heart rate 156 bpm 6 beats. Longest was 14 seconds with an average heart rate of 115 bpm. 2 Short Episodes of Nonsustained Ventricular Tachycardia longest and fastest run was average 140 bpm with a high  of 167 bpm For 7 minutes  Study does show several different rhythm issues.  Short bursts of nonsustained ventricular tachycardia are probably benign, but warrant further evaluation.  The only sustained arrhythmia was atrial fibrillation lasting for about an hour and 5 minutes. _______________  Right/ Left Cardiac Catheterization 08/08/2021:   Lida Cx lesion is 40% stenosed.   Ost RCA to Prox RCA lesion is 35% stenosed.   Prox LAD to Mid LAD lesion is 25% stenosed with 25% stenosed side branch in 2nd Diag.   Mid LAD to Dist LAD lesion is 30% stenosed.   LV end diastolic pressure is severely elevated.   Hemodynamic findings consistent with mild pulmonary hypertension.   There is no aortic valve stenosis.   Summary: Minimal Coronary Artery Disease Mild Secondary Pulmonry Hypertension (Pulmonary Venous Hypertension) -> PA mean 31 mmHg with a PCWP and LVEDP of 23 mmHg. Known moderate to severe mitral stenosis without significant PCWP elevation. Cardiac output-index 6.43-3.0.  Borderline.   Diagnostic Dominance: Right  _______________  Monitor 11/19/2021 to 12/02/2021: Predominant rhythm is sinus with a rate range of 57 to 123 bpm. Average 77 bpm. 2 short  runs of NSVT (PVCs): Fastest 5 beats 169 bpm, longest 12 beats 130 bpm. 43 atrial runs: Fastest 5 beats rate 176 bpm, longest 13 beats with a rate of 93 bpm. Frequent isolated PACs (8.4%) with rare couplets and triplets. Rare isolated PVCs with rare couplets. No sustained arrhythmias: No evidence of atrial fibrillation or atrial flutter. No true SVT. No sustained VT. No significant bradycardia or pauses. Previously posttermination pauses noted after atrial and ventricular runs.   No significant arrhythmias to explain syncope. _______________  CT Heart Angiogram 01/16/2024 ([facility]): Impressions: 1. Enlarged left atrium with extensive nodular mitral annular  calcifications and small size mitral annulus.  2. Normal sized LV with hypertrophy  of the basilar septal wall.  3. Normal origin the coronary arteries with notable proximal coronary  artery calcifications resulting in scattered areas of 50% narrowing  proximalCirc and midLAD and more notable greater than 70% narrowing at the  ostium of D2.  _______________  Echocardiogram 01/16/2024 ([facility]): Summary: NORMAL LEFT VENTRICULAR SYSTOLIC FUNCTION WITH MILD LVH  ESTIMATED EF: >55%, CALC EF(3D): 69%  ELEVATED LA PRESSURES WITH DIASTOLIC DYSFUNCTION (GRADE 2)  NORMAL RIGHT VENTRICULAR SYSTOLIC FUNCTION  VALVULAR REGURGITATION: No AR, No MR, TRIVIAL PR, MILD TR  VALVULAR STENOSIS: No AS, MODERATE MS, No PS, No TS   EKG:  EKG not ordered today.   Recent Labs: 06/08/2024: BNP 132.3 08/17/2024: ALT 17 08/18/2024: BUN 15; Creatinine, Ser 1.12; Hemoglobin 8.3; Platelets 265; Potassium 4.4; Sodium 135  Recent Lipid Panel    Component Value Date/Time   CHOL 118 06/17/2024 1412   TRIG 69.0 06/17/2024 1412   HDL 58.10 06/17/2024 1412   CHOLHDL 2 06/17/2024 1412   VLDL 13.8 06/17/2024 1412   LDLCALC 46 06/17/2024 1412   LDLDIRECT 131 (H) 09/02/2013 1004    Physical Exam:    Vital Signs: LMP  (LMP Unknown)     Wt Readings from Last 3 Encounters:  08/14/24 252 lb (114.3 kg)  08/13/24 253 lb 3.2 oz (114.9 kg)  07/22/24 250 lb (113.4 kg)     General: 68 y.o. female in no acute distress. HEENT: Normocephalic and atraumatic. Sclera clear.  Neck: Supple. No carotid bruits. No JVD. Heart: *** RRR. Distinct S1 and S2. No murmurs, gallops, or rubs.  Lungs: No increased work of breathing. Clear to ausculation bilaterally. No wheezes, rhonchi, or rales.  Abdomen: Soft, non-distended, and non-tender to palpation.  Extremities: No lower extremity edema.  Radial and distal pedal pulses 2+ and equal bilaterally. Skin: Warm and dry. Neuro: No focal deficits. Psych: Normal affect. Responds appropriately.   Assessment:    No diagnosis found.  Plan:     Disposition: Follow up in  ***   Signed, Aline FORBES Door, PA-C  08/22/2024 9:31 AM    [facility]

## 2024-08-22 NOTE — Progress Notes (Unsigned)
 Cardiology Office Note:    Date:  08/22/2024   ID:  Lynn [name], DOB May 20, 1956, MRN [id number]  PCP:  Watt [name], MD  Cardiologist:  Alm Clay, MD Cardiology APP:  Madie Jon Garre, PA { Click to update primary MD,subspecialty MD or APP then REFRESH:1}    Referring MD: Copland, [name], MD   Chief Complaint: follow-up to discuss timing of cardiac catheterization   History of Present Illness:    Lynn [name] is a 68 y.o. female with a history of mild non-obstructive CAD on cardiac catheterization in 07/2021, coronary vasospasms, chronic diastolic CHF, paroxysmal atrial fibrillation on Eliquis , paroxysmal atrial tachycardia, moderate mitral stenosis, interstitial lung disease with chronic hypoxic respiratory failure, hypertension, hyperlipidemia, type 2 diabetes mellitus, CVA, GERD, recurrent GI bleeding from gastric ulcer and AVMs s/p multiple transfusions in the past, microcytic anemia due to thalassemia, obstructive sleep apnea, rheumatoid arthritis, and anxiety/ depression who is followed by Dr. Clay and presents today to discuss timing of cardiac catheterization.   Patient has a complex history as detailed above and has had numerous hospitalizations over the years for a variety of issues. She was admitted for chest pain in 11/2016 and ruled in for NSTEMI. LHC showed non-obstructive CAD but she was started on Imdur  for possible coronary vasospasms. During an admission in 03/2021, TTE showed LVEF of 60-65% with moderate LVH of the basal-septal segment and grade 2 diastolic dysfunction, normal RV function, moderate mitral stenosis (mean gradient 9 mmHg), and an ill defined density originating off the posterior mitral valve leaflet. This led to a TEE which showed a large circumscribed mass adherent to the posterior annulus of the mitral valve with moderate to severe mitral stenosis. Cardiac MRI was ordered for further evaluation and showed normal LV  function with posterolateral mitral annulus mass consistent with degenerative mitral annular calcification. Monitor in 05/2021 showed 1% atrial fibrillation (with the longest episode lasting about 1 hour), 30 brief episodes of SVT/ atrial tachycardia, and 2 short runs of NSVT.  R/ LHC in 07/2021 showed mild non-obstructive CAD, mild pulmonary venous hypertension (PA 31 mmHg with a PCWP and LVEDP of 23 mmHg), and preserved cardiac output. Patient was admitted in 10/2021 for syncope. Echo showed LVEF of 60-65% with severe LVH and grade 1 diastolic dysfunction, normal RV function, severe MAC with moderate to severe mitral stenosis (mean gradient 7.5 mmHg). Syncope was felt to likely be vasovagal in nature. Outpatient monitor showed 2 short runs of NSVT (longest run 12 beat), 43 atrial runs (longest run 13 beats), frequent PACs (8.4%), and rare PVCs.   She was referred to [facility] CT Surgery in 01/2024 for further evaluation of mitral valve disease. Echo there showed LVEF of >55 (3D EF 69%) with grade 2 diastolic dysfunction, normal RV function, and severe MAC with moderate mitral stenosis (mean gradient 11 mmHg). Coronary CTA showed moderate CAD with most significant lesions being a 70% stenosis in the proximal RCA and >70% stenosis of D2 and then 50% stenosis of mid LAD and proximal LCX as well as an enlarged left atrium with extensive nodular MAC and small size mitral annulus. l Dr. Laurence recommended continue medical management for now as surgery would be considered high risk given her her multiple comorbidities. However, Dr. Laurence would consider mitral valve surgical replacement in the future if unable to manage worsening CHF symptoms.   Patient was last seen by Dr. Clay on 08/14/2024 at which time she reported chest pain and shortness of breath,  particularly with exertion. She described the chest pain as as a heart pain located on the left side accompanied by palpitations and fluttering. R/ LHC was recommended with  plans to measure coronary flow reserve to assess for small vessel disease. This was initially scheduled for 08/19/2024. However, pre-procedural  labs showed worsening anemia with hemoglobin of 7.6. Cath was cancelled and she was advised to go to the ED for further evaluation of this.   She was admitted from 08/17/2024 to 08/18/2024 for worsening anemia. Hemoglobin was as low as 7.4. She was treated with 1 unit of PRBCs. Hemoccult was negative. She reported recurrent epistaxis lately and this was felt to be the possible cause. Eliquis  was held and she was advised to follow-up with ENT.   Patient presents today for follow-up. ***  Chest Pain Non-Obstructive CAD LHC in 07/2021 showed mild non-obstructive CAD. Coronary CTA in showed 01/2024 at [facility] moderate CAD with most significant lesions being a 70% stenosis in the proximal RCA and >70% stenosis of D2 and then 50% stenosis of mid LAD and proximal LCX. Patient reported exertional chest pain and shortness of breath at last visit in 07/2024. R/ LHC was arranged and initially scheduled for 08/19/2024. However, this was cancelled when pre-procedural labs showed worsening anemia.  - *** - Continue Imdur  30mg  daily.  - Not on Aspirin  given DOAC.  - Continue Lipitor  80mg  daily.   Chronic Diastolic CHF Last Echo in 01/2024 at [facility] showed LVEF of  >55 (3D EF 69%) with grade 2 diastolic dysfunction as well as normal RV function. - *** - Continue Lasix  40mg  daily. Can continue to take an extra dose as needed for weight gain.  - Continue Spironolactone  25mg  daily.  - Intolerant to Farxiga  in the past.  - Continue daily weights and sodium/ fluid restrictions.   Paroxysmal Atrial Fibrillation Paroxysmal Atrial Tachycardia Monitor in 05/2021 showed 1% atrial fibrillation burden and 30 short runs of SVT/ PAT. Repeat monitor in 11/2021 showed short atrial runs but no atrial fibrillation.  - *** - Continue Cardizem  CD 360mg  daily.  - Previously on Eliquis  5mg  twice  daily. However, this was recently held during recent admission for acute on chronic anemia felt to possible be due to recurrent epistaxis. ***  Moderate Mitral Stenosis Last Echo in 01/2024 at [facility] showed severe MAC with moderate aortic stenosis (mean gradient 11 mmHg).  - Continue routine monitoring with surveillance Echos per guidelines.   Hypertension BP *** - Continue current medications: Imdur  30mg  daily, Cardizem  CD 360mg  daily, Losartan  50mg  daily,  and Spironolactone  25mg  daily.   Hyperlipidemia Lipid panel in 06/2024: Total Cholesterol 118, Triglycerides 69, HDL 58.1, LDL 46.  - Continue Lipitor  80mg  daily.   Type 2 Diabetes Mellitus Hemoglobin A1c 6.3% in 102025.  - On Insulin .  - Management per PCP.  CKD Stage IIIa Baseline creatinine around 1.1 to 1.3.   Interstitial Lung Disease Chronic Hypoxic Respiratory Failure  On *** L of O2. - Management per Pulmonology (Dr. Geronimo).  - RHC will help assess for pulmonary hypertension. Per Dr. Reeves last note, if there is no significant pulmonary hypertension, then he will consider immunomodulator or antifibrotic for worsening lung disease. ***  Obstructive Sleep Apnea - Continue CPAP.  - Followed by Pulmonology.   Chronic Anemia Thalassemia Epistaxis   History of GI Bleeds Patient was recently admitted in early 08/2024 for acute on chronic anemia. S/p 1 unit of PRBCs. Hemoccult was negative. Patient reported recurrent epistaxis which was felt to possibly  be the cause of her drop in hemoglobin. She was advised to follow-up with ENT.  - ***  EKGs/Labs/Other Studies Reviewed:    The following studies were reviewed today:  TEE 03/28/2021: Impressions: 1. Left ventricular ejection fraction, by estimation, is 55 to 60%. The  left ventricle has normal function. The left ventricle has no regional  wall motion abnormalities.   2. Right ventricular systolic function was not well visualized. The right  ventricular size  is not well visualized.   3. No left atrial/left atrial appendage thrombus was detected.   4. 3-D images of the mitral annular mass were obtained. There is a large  circumscribed mass adherent to the posterior annulus. The posterior  leaflet is fixed . There is moderate - severe mitral stenosis.      The mass is large , measuring 1.8 x 1.4 cm. Differential diagnosis  includes myxoma, exuberant mitral annular calcification, myxoma, or other  primary cardiac tumor. suggest Cardiac MRI for further evaluation . This  mass was present on transthoracic  echo from 2018. . The mitral valve is abnormal. Mild mitral valve  regurgitation. Moderate to severe mitral stenosis. Severe mitral annular  calcification.   5. The aortic valve is tricuspid. Aortic valve regurgitation is not  visualized. No aortic stenosis is present.   6. There is mild (Grade II) plaque.  _______________  Cardiac MRI 03/30/2021: Impressions: 1.  Normal right and left ventricular size and function, LVEF 53%. 2. Posterolateral mitral annulus mass consistent with degenerative mitral annular calcification (hypointense on T1, T2 and STIR sequences, no LGE). 3. I reviewed today's High resolution chest CT which confirmed calcification of mitral annular mass as above. 4.  No scar or late gadolinium enhancement noted on LV myocardium. _______________  Monitor 05/22/2021 to 06/12/2021: 2 separate monitors worn for total of 21 days. Overall predominantly sinus rhythm with minimum heart rate of 55 bpm, maximum 140 bpm and average of 81 bpm. Atrial fibrillation was seen with a 1% burden, longest duration was 1 hour 5 minutes. Heart rate ranged from 53 to 117 bpm. 30 episodes of brief supraventricular tachycardia/atrial tachycardia: Fastest heart rate 156 bpm 6 beats. Longest was 14 seconds with an average heart rate of 115 bpm. 2 Short Episodes of Nonsustained Ventricular Tachycardia longest and fastest run was average 140 bpm with a high  of 167 bpm For 7 minutes  Study does show several different rhythm issues.  Short bursts of nonsustained ventricular tachycardia are probably benign, but warrant further evaluation.  The only sustained arrhythmia was atrial fibrillation lasting for about an hour and 5 minutes. _______________  Right/ Left Cardiac Catheterization 08/08/2021:   Lida Cx lesion is 40% stenosed.   Ost RCA to Prox RCA lesion is 35% stenosed.   Prox LAD to Mid LAD lesion is 25% stenosed with 25% stenosed side branch in 2nd Diag.   Mid LAD to Dist LAD lesion is 30% stenosed.   LV end diastolic pressure is severely elevated.   Hemodynamic findings consistent with mild pulmonary hypertension.   There is no aortic valve stenosis.   Summary: Minimal Coronary Artery Disease Mild Secondary Pulmonry Hypertension (Pulmonary Venous Hypertension) -> PA mean 31 mmHg with a PCWP and LVEDP of 23 mmHg. Known moderate to severe mitral stenosis without significant PCWP elevation. Cardiac output-index 6.43-3.0.  Borderline.   Diagnostic Dominance: Right  _______________  Monitor 11/19/2021 to 12/02/2021: Predominant rhythm is sinus with a rate range of 57 to 123 bpm. Average 77 bpm. 2 short  runs of NSVT (PVCs): Fastest 5 beats 169 bpm, longest 12 beats 130 bpm. 43 atrial runs: Fastest 5 beats rate 176 bpm, longest 13 beats with a rate of 93 bpm. Frequent isolated PACs (8.4%) with rare couplets and triplets. Rare isolated PVCs with rare couplets. No sustained arrhythmias: No evidence of atrial fibrillation or atrial flutter. No true SVT. No sustained VT. No significant bradycardia or pauses. Previously posttermination pauses noted after atrial and ventricular runs.   No significant arrhythmias to explain syncope. _______________  CT Heart Angiogram 01/16/2024 ([facility]): Impressions: 1. Enlarged left atrium with extensive nodular mitral annular  calcifications and small size mitral annulus.  2. Normal sized LV with hypertrophy  of the basilar septal wall.  3. Normal origin the coronary arteries with notable proximal coronary  artery calcifications resulting in scattered areas of 50% narrowing  proximalCirc and midLAD and more notable greater than 70% narrowing at the  ostium of D2.  _______________  Echocardiogram 01/16/2024 ([facility]): Summary: NORMAL LEFT VENTRICULAR SYSTOLIC FUNCTION WITH MILD LVH  ESTIMATED EF: >55%, CALC EF(3D): 69%  ELEVATED LA PRESSURES WITH DIASTOLIC DYSFUNCTION (GRADE 2)  NORMAL RIGHT VENTRICULAR SYSTOLIC FUNCTION  VALVULAR REGURGITATION: No AR, No MR, TRIVIAL PR, MILD TR  VALVULAR STENOSIS: No AS, MODERATE MS, No PS, No TS   EKG:  EKG not ordered today.   Recent Labs: 06/08/2024: BNP 132.3 08/17/2024: ALT 17 08/18/2024: BUN 15; Creatinine, Ser 1.12; Hemoglobin 8.3; Platelets 265; Potassium 4.4; Sodium 135  Recent Lipid Panel    Component Value Date/Time   CHOL 118 06/17/2024 1412   TRIG 69.0 06/17/2024 1412   HDL 58.10 06/17/2024 1412   CHOLHDL 2 06/17/2024 1412   VLDL 13.8 06/17/2024 1412   LDLCALC 46 06/17/2024 1412   LDLDIRECT 131 (H) 09/02/2013 1004    Physical Exam:    Vital Signs: LMP  (LMP Unknown)     Wt Readings from Last 3 Encounters:  08/14/24 252 lb (114.3 kg)  08/13/24 253 lb 3.2 oz (114.9 kg)  07/22/24 250 lb (113.4 kg)     General: 68 y.o. female in no acute distress. HEENT: Normocephalic and atraumatic. Sclera clear.  Neck: Supple. No carotid bruits. No JVD. Heart: *** RRR. Distinct S1 and S2. No murmurs, gallops, or rubs.  Lungs: No increased work of breathing. Clear to ausculation bilaterally. No wheezes, rhonchi, or rales.  Abdomen: Soft, non-distended, and non-tender to palpation.  Extremities: No lower extremity edema.  Radial and distal pedal pulses 2+ and equal bilaterally. Skin: Warm and dry. Neuro: No focal deficits. Psych: Normal affect. Responds appropriately.   Assessment:    No diagnosis found.  Plan:     Disposition: Follow up in  ***   Signed, Aline FORBES Door, PA-C  08/22/2024 9:31 AM    [facility]

## 2024-08-23 NOTE — Progress Notes (Unsigned)
 [name] [address] [place], [place] [address] [phone]  Date:  08/24/2024   Name:  Lynn [name]   DOB:  Dec 14, 1955   MRN:  [id number]  PCP:  Watt [name], MD    Chief Complaint: No chief complaint on file.   History of Present Illness:  Lynn [name] is a 68 y.o. very pleasant female patient who presents with the following:  Patient seen today for hospitalization follow-up I saw her in the office most recently in September- -history of paroxysmal atrial tach and fibrillation, on diltiazem  and Eliquis , interstitial lung disease and COPD on oxygen , diabetes, history of CVA, rheumatoid arthritis, hypertension, thalassemia  She was admitted overnight 11/3 through 11/4 with recurrent anemia-she was seen by cardiology a day or 2 prior and they noted severe anemia, she was directed to the emergency department  Hospital Course: Patient with PMH of HTN, HLD, chronic HFpEF, CAD, PAF, chronic respiratory failure due to COPD, present to the hospital with anemia. Received 1 PRBC transfusion. H&H appropriately stable. No further bleeding seen. Recommendation is to stop Eliquis  for now until seen by cardiology and ENT. Assessment and plan. Microcytic anemia due to thalassemia Recurrent epistaxis. Patient presents with complaints of abnormal blood work. Went to see her PCP and her hemoglobin was 7.4. Hemoglobin 7.4 here as well. Baseline appears to be around 8. Received 1 PRBC transfusion. In the past had relatively low B12 and folic acid  level. Iron level is normal. Her microcytosis secondary to alpha thalassemia. At present we will provide supplementation and recommend follow-up with PCP with repeat CBC in 1 week. No active bleeding seen. Patient reports that she has history of epistaxis and recently has undergone frequent episodes of epistaxis which led to probable anemia. Denies having  any GI bleed or blood in the stool or melena or dark stools or diarrhea. No abdominal symptoms reported by the patient's as well. For now I will be holding her Eliquis . This was discussed with cardiology. Close follow-up with cardiology and ENT recommended. CAD Patient reports having intermittent chest pains for which she is followed by cardiology.  Prior workup included elevated coronary Calcium  Score with significant plaque burden and moderate 40 to 50% stenosis with CT angiogram suggested 70% ostial D2 disease.  Plan had been for patient to have cardiac catheterization on 11/5 but had been canceled due to anemia. - Recommend outpatient follow-up with cardiology to reschedule Paroxysmal atrial fibrillation on chronic anticoagulation Patient appears to be in a sinus rhythm at this time.  She had been holding anticoagulation over the last 3 days in preparation for cardiac catheterization which has now been canceled. - Continue Cardizem  Holding Eliquis  for now. Controlled diabetes mellitus type 2, with long-term use of insulin  Last hemoglobin A1c noted to be 6.3 when checked on 07/22/2024.  Home medication regimen includes Mounjaro .  Patient wears an insulin  pump, but is in need of refills. Resuming home medication. Chronic respiratory failure with hypoxia COPD Interstitial lung disease Patient is on 3 L of oxygen  at home. - Continue nasal cannula oxygen  to maintain O2 saturation - Continue albuterol  nebs as needed for shortness of breath/wheezing - Continue outpatient follow-up with pulmonology Diastolic congestive heart failure Patient appears relatively euvolemic at this time.  Last echocardiogram noted EF to be 60 to 65% with grade 1 diastolic dysfunction. - Continue goal-directed medical therapy as tolerated Essential hypertension Blood pressures noted to be elevated up to  157/77. - Continue Cardizem , Imdur  furosemide , spironolactone , and losartan  Dyslipidemia - Continue  atorvastatin    She also recently saw pulmonology in the office at the end of October She saw her endocrinologist, Dr.[name] also in October Discussed the use of AI scribe software for clinical note transcription with the patient, who gave verbal consent to proceed.  History of Present Illness   Her hemoglobin on discharge home 11/4 was 8.3, down from a low of 7.4  Patient Active Problem List   Diagnosis Date Noted   Microcytic anemia 08/17/2024   History of epistaxis 08/17/2024   Atypical angina 08/14/2024   Pulmonary hypertension, unspecified (HCC) 08/14/2024   AVM (arteriovenous malformation) of small bowel, acquired with hemorrhage 07/18/2023   COPD (chronic obstructive pulmonary disease) (HCC) 07/16/2023   Obesity, Class III, BMI 40-49.9 (morbid obesity) (HCC) 07/16/2023   Dyslipidemia 07/15/2023   Acute upper GI bleed 09/04/2022   IDA (iron deficiency anemia) 02/09/2022   Acute on chronic respiratory failure with hypoxia (HCC) 11/14/2021   AKI (acute kidney injury)    Acute gastric ulcer without hemorrhage or perforation    Ulcer of esophagus without bleeding    PAF (paroxysmal atrial fibrillation) (HCC); CHA2DS2-VASc score 7 07/05/2021   Acute gastric ulcer with hemorrhage    ILD (interstitial lung disease) (HCC)    Hypokalemia 03/24/2021   Coronary artery spasm 08/09/2020   Rheumatoid arthritis (HCC) 11/09/2019   Mobility impaired 04/01/2019   Class 3 obesity (HCC) 12/03/2017   Moderate mitral stenosis by prior echocardiography 08/23/2017   Chronic diastolic CHF (congestive heart failure) (HCC) 08/12/2017   Chronic respiratory failure with hypoxia (HCC) 08/12/2017   OSA on CPAP 08/12/2017   Near syncope 07/03/2017   Anxiety 07/03/2017   Coronary artery disease, non-occlusive 12/08/2016   PAT (paroxysmal atrial tachycardia)    H/O total knee replacement 08/19/2015   Thalassemia 09/02/2013   Type 2 diabetes mellitus with hyperlipidemia (HCC) 08/24/2012    Essential hypertension 07/22/2012    Past Medical History:  Diagnosis Date   Allergy 2018   Anxiety    CHF (congestive heart failure) (HCC)    Clotting disorder    blood clot in eye 2016   COPD (chronic obstructive pulmonary disease) (HCC)    Coronary artery disease, non-occlusive    a. 11/2016 NSTEMI/Cath: LM nl, LAD 40p, 50md, D1/2 small, LCX 40ost, OM2/3 nl/small, RCA 35 ost/mid, RPDA/RPL small/nl.   Coronary vasospasm    Depression    Diastolic dysfunction    a. 11/2016 Echo: EF 55-60%, gr1 DD, sev calcified MV annulus, mildly dil LA.   Eye hemorrhage 01/2015   right; resolved (07/03/2017)   Gastric ulcer    GERD (gastroesophageal reflux disease)    Headache    monthly (07/03/2017)   Heart murmur    History of blood transfusion    when I had an ectopic pregnancy   History of stomach ulcers    Hyperlipidemia    Hypertension    Microcytic anemia    Moderate mitral stenosis by prior echocardiogram 03/2021   TTE: Mod MS (mean gradient 9 mmHg). -- TEE Moderate-Severe MS (mean gradient 14 mmHg) with fixed Post MV Leaflet & Severe MAC w/ large circumscribed calcified mass on the Post MV Leaflet. (Mass previously described in 2018) - CMRI identifies calcified mass & not Tumor.; R&LHC - LVEDP & PCWP both 23 mmHg indicating minimal resting gradient   Myocardial infarction (HCC) 2023   OSA on CPAP    setting is unknown   Oxygen  deficiency  PAF (paroxysmal atrial fibrillation) (HCC) 03/2021   Event Monitor Aug-Sept 2022: Predominantly sinus rhythm.  1% A. fib burden.  30 brief episodes of PAT.  2 short bursts of NSVT.   PAT (paroxysmal atrial tachycardia)    Event monitor from August 2022 showed 30 brief bursts longest 14 seconds.   Pneumonia    several times (07/03/2017)   PVC's (premature ventricular contractions)    Rheumatoid arthritis (HCC)    Sickle cell anemia (HCC)    Sleep apnea    Stroke [facility])    Syncope 07/03/2017 X 2   called seizures but no medications    Thalassemia    my cells are sickle cell shaped but I don't have sickle cell anemia (07/03/2017)   Type II diabetes mellitus (HCC)     Past Surgical History:  Procedure Laterality Date   48-Hour Monitor  03/18/2017   Sinus rhythm with sinus tachycardia (rate 58-134 BPM)multiple PVCs noted with couplets and bigeminy. One triplet. 7 runs of PAT ranging from 100-130 bpm. Longest was 33 beats.   BALLOON ENTEROSCOPY N/A 08/13/2023   Procedure: BALLOON ENTEROSCOPY;  Surgeon: San Sandor GAILS, DO;  Location: [facility];  Service: Gastroenterology;  Laterality: N/A;   BIOPSY  04/03/2021   Procedure: BIOPSY;  Surgeon: Leigh Elspeth SQUIBB, MD;  Location: [facility];  Service: Gastroenterology;;   BIOPSY  12/17/2022   Procedure: BIOPSY;  Surgeon: Abran Norleen SAILOR, MD;  Location: [facility];  Service: Gastroenterology;;   BREAST BIOPSY Left    BRONCHIAL WASHINGS  03/28/2021   Procedure: BRONCHIAL WASHINGS;  Surgeon: Gretta Leita SQUIBB, DO;  Location: [facility];  Service: Endoscopy;;   CARDIAC CATHETERIZATION N/A 11/19/2016   Procedure: Left Heart Cath and Coronary Angiography;  Surgeon: Victory LELON Sharps, MD;  Location: [facility] INVASIVE CV LAB;  Service: Cardiovascular.    LM nl, LAD 40p, 50md, D1/2 small, LCX 40ost, OM2/3 nl/small, RCA 35 ost/mid, RPDA/RPL small/nl.   CARDIAC MRI  03/30/2021   Normal LVEF ~53%. 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 No Scar or Late Gadolinium Enhancement on LV Myocardium.   CARPAL TUNNEL RELEASE Right 11/01/2014   COLONOSCOPY     DILATION AND CURETTAGE OF UTERUS     ECTOPIC PREGNANCY SURGERY  X 2   ENTEROSCOPY N/A 09/05/2022   Procedure: ENTEROSCOPY;  Surgeon: Wilhelmenia Aloha Raddle., MD;  Location: [facility];  Service: Gastroenterology;  Laterality: N/A;   ENTEROSCOPY N/A 07/17/2023   Procedure: ENTEROSCOPY;  Surgeon: Abran Norleen SAILOR, MD;  Location: [facility];  Service: Gastroenterology;  Laterality: N/A;    ESOPHAGOGASTRODUODENOSCOPY N/A 12/17/2022   Procedure: ESOPHAGOGASTRODUODENOSCOPY (EGD);  Surgeon: Abran Norleen SAILOR, MD;  Location: [facility];  Service: Gastroenterology;  Laterality: N/A;   ESOPHAGOGASTRODUODENOSCOPY (EGD) WITH PROPOFOL  N/A 04/03/2021   Procedure: ESOPHAGOGASTRODUODENOSCOPY (EGD) WITH PROPOFOL ;  Surgeon: Leigh Elspeth SQUIBB, MD;  Location: [facility];  Service: Gastroenterology;  Laterality: N/A;   ESOPHAGOGASTRODUODENOSCOPY (EGD) WITH PROPOFOL  N/A 08/15/2021   Procedure: ESOPHAGOGASTRODUODENOSCOPY (EGD) WITH PROPOFOL ;  Surgeon: Aneita Gwendlyn DASEN, MD;  Location: [facility];  Service: Endoscopy;  Laterality: N/A;   GIVENS CAPSULE STUDY N/A 07/17/2023   Procedure: GIVENS CAPSULE STUDY;  Surgeon: Abran Norleen SAILOR, MD;  Location: [facility];  Service: Gastroenterology;  Laterality: N/A;   HOT HEMOSTASIS N/A 09/05/2022   Procedure: HOT HEMOSTASIS (ARGON PLASMA COAGULATION/BICAP);  Surgeon: Wilhelmenia Aloha Raddle., MD;  Location: [facility];  Service: Gastroenterology;  Laterality: N/A;   HOT HEMOSTASIS N/A 08/13/2023   Procedure: HOT  HEMOSTASIS (ARGON PLASMA COAGULATION/BICAP);  Surgeon: San Sandor GAILS, DO;  Location: [facility];  Service: Gastroenterology;  Laterality: N/A;   JOINT REPLACEMENT     KNEE ARTHROSCOPY Left 11/2014   RIGHT/LEFT HEART CATH AND CORONARY ANGIOGRAPHY N/A 08/08/2021   Procedure: RIGHT/LEFT HEART CATH AND CORONARY ANGIOGRAPHY;  Surgeon: [name], MD;  Location: [facility] INVASIVE CV LAB;  Service: Cardiovascular;Ost LCx 40%. Ost-Prox RCA 35%. Prox-Mid LAD 25%-<25% D2. Mod-Severely Elevated LVEDP. Mild PA HTN -> PA mean 31 mmHg with a PCWP and LVEDP of 23 mmHg   SUBMUCOSAL TATTOO INJECTION  09/05/2022   Procedure: SUBMUCOSAL TATTOO INJECTION;  Surgeon: Wilhelmenia Aloha Raddle., MD;  Location: [facility];  Service: Gastroenterology;;   SUBMUCOSAL TATTOO INJECTION  07/17/2023   Procedure: SUBMUCOSAL TATTOO INJECTION;  Surgeon: Abran Norleen SAILOR, MD;   Location: [facility];  Service: Gastroenterology;;   TEE WITHOUT CARDIOVERSION N/A 03/28/2021   Procedure: TRANSESOPHAGEAL ECHOCARDIOGRAM (TEE);  Surgeon: Alveta Aleene PARAS, MD;  Location: [facility] ENDOSCOPY;; LVEF 55-60%. Normal LV Fxn & no RWMA. No LAA thrombus. Gr II Ao Plaque. Large circumscribed calcific mass (1.8 x 1.4 cm) anterior to posterior annulus.  Fixed posterior leaflet with Mod-Severe MS (Mean MVG ~14 mmHg, Peak 20 mmhg).  Mild MR   TONSILLECTOMY     TOTAL KNEE ARTHROPLASTY Right 02/18/2015   Procedure: RIGHT TOTAL KNEE ARTHROPLASTY;  Surgeon: Marcey Her, MD;  Location: [facility] OR;  Service: Orthopedics;  Laterality: Right;   TOTAL KNEE ARTHROPLASTY Left 08/19/2015   Procedure: LEFT TOTAL KNEE ARTHROPLASTY;  Surgeon: Marcey Her, MD;  Location: [facility] OR;  Service: Orthopedics;  Laterality: Left;   TRANSTHORACIC ECHOCARDIOGRAM  11/2016; 07/2017:   a) normal LV size, thickness and function. EF 55-60%. GR 1 DD.No RWMA severe mitral annular calcification, but no mitral stenosis. Mild LA dilation;; b) normal LV size and function.  EF 65-75%.  Unable to assess diastolic function.  Aortic sclerosis with no stenosis.  Moderate mitral stenosis? (Gradient 9 mmHg) -?  Mild-mod left atrial enlargement    TRANSTHORACIC ECHOCARDIOGRAM  01/2019   EF 65 to 70%.  Normal function.  Elevated LVEDP-GR 1 DD.  Normal RV size and function.  Large calcific mass on posterior mitral leaflet mild to moderate mitral stenosis.   TRANSTHORACIC ECHOCARDIOGRAM  03/24/2021   EF 60 to 65%.  LV function with normal wall motion.  Moderate basal septal LVH.  GRII DD & Mild LA dilation.-> elevated LVEDP.  Normal RV function.  Mildly elevated PAP.  Ill-defined density measuring 3.23 x 2.1 cm region of the posterior MV leaflet along with dense MAC.  Moderate MS (mean MVG of 9 mmHg.) Normal AoV & RAP. --> Recommend TEE 2/2 concern for MV SBE.   TRANSTHORACIC ECHOCARDIOGRAM  04/26/2022   EF 60 to 65%.  No RWMA.  Moderate LVH with GR 1  DD.  Normal RV size and function.  Mild LA dilation.  Large calcific mass of posterior MV leaflet.  Mean PG 13 mm 3.  Trivial MR.  Mild to moderate MS.  Severe MAC.  Normal AOV.  Normal RAP.   VAGINAL HYSTERECTOMY     VIDEO BRONCHOSCOPY N/A 03/28/2021   Procedure: VIDEO BRONCHOSCOPY WITHOUT FLUORO;  Surgeon: Gretta Leita SQUIBB, DO;  Location: [facility] ENDOSCOPY;  Service: Endoscopy;  Laterality: N/A;    Social History   Tobacco Use   Smoking status: Former    Current packs/day: 0.00    Average packs/day: 0.3 packs/day for 44.0 years (11.0 ttl pk-yrs)    Types: Cigarettes  Start date: 02/17/1971    Quit date: 02/17/2015    Years since quitting: 9.5   Smokeless tobacco: Never  Vaping Use   Vaping status: Never Used  Substance Use Topics   Alcohol use: Not Currently    Comment: 07/03/2017 might have a few drinks/year   Drug use: No    Family History  Problem Relation Age of Onset   Cancer Mother    Diabetes Mother    Hypertension Mother    Hyperlipidemia Mother    Cancer Father    Hyperlipidemia Father    Mental illness Sister    Diabetes Sister    Diabetes Maternal Grandmother    Diabetes Maternal Grandfather    Colon cancer Neg Hx    Esophageal cancer Neg Hx    Stomach cancer Neg Hx    Rectal cancer Neg Hx    Tremor Neg Hx    Parkinson's disease Neg Hx     Allergies  Allergen Reactions   Methotrexate Other (See Comments)    Pneumonitis   Penicillins Hives    Childhood allergy    Farxiga  [Dapagliflozin ] Other (See Comments)    Dizzy and lethargic    Metformin  And Related Diarrhea and Other (See Comments)    Also, bleeding    Medication list has been reviewed and updated.  Current Outpatient Medications on File Prior to Visit  Medication Sig Dispense Refill   acetaminophen  (TYLENOL ) 500 MG tablet Take 1,000 mg by mouth every 6 (six) hours as needed (for pain).     albuterol  (PROVENTIL ) (2.5 MG/3ML) 0.083% nebulizer solution INHALE 3 ML BY NEBULIZATION EVERY 6 HOURS  AS NEEDED FOR WHEEZING OR SHORTNESS OF BREATH 150 mL 7   albuterol  (VENTOLIN  HFA) 108 (90 Base) MCG/ACT inhaler TAKE 2 PUFFS BY MOUTH EVERY 6 HOURS AS NEEDED FOR WHEEZE OR SHORTNESS OF BREATH 8.5 each 2   ALPRAZolam  (XANAX ) 0.25 MG tablet Take 1 tablet (0.25 mg total) by mouth 2 (two) times daily as needed for anxiety. TAKE 1-2 TABLETS BY MOUTH 2 TIMES DAILY AS NEEDED FOR ANXIETY OR INSOMNIA 60 tablet 1   [Paused] apixaban  (ELIQUIS ) 5 MG TABS tablet Take 1 tablet (5 mg total) by mouth 2 (two) times daily. 180 tablet 3   atorvastatin  (LIPITOR ) 80 MG tablet Take 1 tablet (80 mg total) by mouth every evening. 90 tablet 3   cetirizine (ZYRTEC) 10 MG tablet Take 10 mg by mouth daily as needed for allergies.     Continuous Glucose Sensor (DEXCOM G7 SENSOR) MISC 1 Device by Does not apply route as directed. 9 each 3   cyanocobalamin  (VITAMIN B12) 1000 MCG tablet Take 1 tablet (1,000 mcg total) by mouth daily. 30 tablet 0   diltiazem  (CARDIZEM  CD) 360 MG 24 hr capsule TAKE 1 CAPSULE BY MOUTH EVERY DAY 90 capsule 1   ferrous sulfate  325 (65 FE) MG EC tablet Take 1 tablet (325 mg total) by mouth daily with breakfast. 60 tablet 3   folic acid  (FOLVITE ) 1 MG tablet Take 1 tablet (1 mg total) by mouth daily. 30 tablet 0   furosemide  (LASIX ) 40 MG tablet Take 40 mg by mouth in the morning and an additional 40 mg for an overnight weight gain of 3 pounds or 5 pounds in a week 135 tablet 3   glucose blood (CONTOUR NEXT TEST) test strip 3x daily 300 each 11   hydroxychloroquine  (PLAQUENIL ) 200 MG tablet Take 200 mg by mouth 2 (two) times daily.     Insulin   Disposable Pump (OMNIPOD 5 G7 PODS, GEN 5,) MISC 1 Device by Other route every other day. 45 each 3   insulin  lispro (HUMALOG ) 100 UNIT/ML injection Max Daily 70 units per pump 70 mL 3   isosorbide  mononitrate (IMDUR ) 30 MG 24 hr tablet Take 1 tablet (30 mg total) by mouth daily. 90 tablet 3   Lancets 30G MISC 1 Device by Does not apply route 3 (three) times daily.  300 each 9   losartan  (COZAAR ) 100 MG tablet Take 0.5 tablets (50 mg total) by mouth daily. 45 tablet 3   Melatonin 5 MG CHEW Chew 10 mg by mouth at bedtime.     Multiple Vitamin (MULTIVITAMIN WITH MINERALS) TABS tablet Take 1 tablet by mouth daily with breakfast.     oxymetazoline  (AFRIN) 0.05 % nasal spray Place 2 sprays into both nostrils every 4 (four) hours as needed (for nose bleed, apply spray and hold pressure.). 15 mL 2   Oxymetazoline  HCl (VICKS SINEX NA) Place 1 spray into both nostrils every 12 (twelve) hours as needed (for congestion- MAX OF THREE CONSECUTIVE DAYS).     pantoprazole  (PROTONIX ) 40 MG tablet Take 1 tablet (40 mg total) by mouth daily before breakfast. 90 tablet 3   SAFETY-LOK TB SYRINGE 27GX.5 27G X 1/2 1 ML MISC      sodium chloride  (OCEAN) 0.65 % SOLN nasal spray Place 1 spray into both nostrils as needed. 30 mL 5   spironolactone  (ALDACTONE ) 25 MG tablet Take 1 tablet (25 mg total) by mouth daily. 90 tablet 3   tirzepatide  (MOUNJARO ) 15 MG/0.5ML Pen Inject 15 mg into the skin once a week. 6 mL 3   traZODone  (DESYREL ) 50 MG tablet Take 0.5-1 tablets (25-50 mg total) by mouth at bedtime as needed for sleep. Needs appt 90 tablet 3   No current facility-administered medications on file prior to visit.    Review of Systems:  As per HPI- otherwise negative.   Physical Examination: There were no vitals filed for this visit. There were no vitals filed for this visit. There is no height or weight on file to calculate BMI. Ideal Body Weight:    GEN: no acute distress. HEENT: Atraumatic, Normocephalic.  Ears and Nose: No external deformity. CV: RRR, No M/G/R. No JVD. No thrill. No extra heart sounds. PULM: CTA B, no wheezes, crackles, rhonchi. No retractions. No resp. distress. No accessory muscle use. ABD: S, NT, ND, +BS. No rebound. No HSM. EXTR: No c/c/e PSYCH: Normally interactive. Conversant.    Assessment and Plan: No diagnosis found.  Assessment &  Plan   Signed Harlene Schroeder, MD

## 2024-08-24 ENCOUNTER — Encounter: Payer: Self-pay | Admitting: Family Medicine

## 2024-08-24 ENCOUNTER — Ambulatory Visit (INDEPENDENT_AMBULATORY_CARE_PROVIDER_SITE_OTHER): Admitting: Family Medicine

## 2024-08-24 VITALS — BP 116/74 | HR 72 | Ht 63.0 in | Wt 253.0 lb

## 2024-08-24 DIAGNOSIS — J849 Interstitial pulmonary disease, unspecified: Secondary | ICD-10-CM | POA: Diagnosis not present

## 2024-08-24 DIAGNOSIS — D509 Iron deficiency anemia, unspecified: Secondary | ICD-10-CM | POA: Diagnosis not present

## 2024-08-25 ENCOUNTER — Ambulatory Visit: Payer: Self-pay | Admitting: Family Medicine

## 2024-08-25 ENCOUNTER — Encounter: Payer: Self-pay | Admitting: Family Medicine

## 2024-08-25 LAB — CBC
HCT: 28.3 % — ABNORMAL LOW (ref 36.0–46.0)
Hemoglobin: 8.8 g/dL — ABNORMAL LOW (ref 12.0–15.0)
MCHC: 31.1 g/dL (ref 30.0–36.0)
MCV: 64.3 fl — ABNORMAL LOW (ref 78.0–100.0)
Platelets: 323 K/uL (ref 150.0–400.0)
RBC: 4.4 Mil/uL (ref 3.87–5.11)
RDW: 20.5 % — ABNORMAL HIGH (ref 11.5–15.5)
WBC: 7.3 K/uL (ref 4.0–10.5)

## 2024-08-26 ENCOUNTER — Encounter: Payer: Self-pay | Admitting: Family Medicine

## 2024-08-26 DIAGNOSIS — R040 Epistaxis: Secondary | ICD-10-CM

## 2024-08-27 ENCOUNTER — Ambulatory Visit: Attending: Student | Admitting: Student

## 2024-08-27 ENCOUNTER — Encounter: Payer: Self-pay | Admitting: Student

## 2024-08-27 VITALS — BP 132/72 | HR 82 | Ht 63.0 in | Wt 258.0 lb

## 2024-08-27 DIAGNOSIS — N1831 Chronic kidney disease, stage 3a: Secondary | ICD-10-CM

## 2024-08-27 DIAGNOSIS — I2510 Atherosclerotic heart disease of native coronary artery without angina pectoris: Secondary | ICD-10-CM | POA: Diagnosis not present

## 2024-08-27 DIAGNOSIS — I5032 Chronic diastolic (congestive) heart failure: Secondary | ICD-10-CM | POA: Diagnosis not present

## 2024-08-27 DIAGNOSIS — I349 Nonrheumatic mitral valve disorder, unspecified: Secondary | ICD-10-CM

## 2024-08-27 DIAGNOSIS — R079 Chest pain, unspecified: Secondary | ICD-10-CM

## 2024-08-27 DIAGNOSIS — G4733 Obstructive sleep apnea (adult) (pediatric): Secondary | ICD-10-CM

## 2024-08-27 DIAGNOSIS — D649 Anemia, unspecified: Secondary | ICD-10-CM

## 2024-08-27 DIAGNOSIS — Z794 Long term (current) use of insulin: Secondary | ICD-10-CM

## 2024-08-27 DIAGNOSIS — R040 Epistaxis: Secondary | ICD-10-CM

## 2024-08-27 DIAGNOSIS — I10 Essential (primary) hypertension: Secondary | ICD-10-CM

## 2024-08-27 DIAGNOSIS — E785 Hyperlipidemia, unspecified: Secondary | ICD-10-CM

## 2024-08-27 DIAGNOSIS — J9611 Chronic respiratory failure with hypoxia: Secondary | ICD-10-CM

## 2024-08-27 DIAGNOSIS — I480 Paroxysmal atrial fibrillation: Secondary | ICD-10-CM

## 2024-08-27 DIAGNOSIS — J849 Interstitial pulmonary disease, unspecified: Secondary | ICD-10-CM

## 2024-08-27 DIAGNOSIS — I4719 Other supraventricular tachycardia: Secondary | ICD-10-CM

## 2024-08-27 DIAGNOSIS — D569 Thalassemia, unspecified: Secondary | ICD-10-CM

## 2024-08-27 DIAGNOSIS — E118 Type 2 diabetes mellitus with unspecified complications: Secondary | ICD-10-CM

## 2024-08-27 DIAGNOSIS — I050 Rheumatic mitral stenosis: Secondary | ICD-10-CM

## 2024-08-27 MED ORDER — NITROGLYCERIN 0.4 MG SL SUBL: 0.4000 mg | SUBLINGUAL_TABLET | SUBLINGUAL | 3 refills | Status: AC | PRN

## 2024-08-27 NOTE — Patient Instructions (Addendum)
 Medication Instructions:  Your physician recommends that you continue on your current medications as directed. Please refer to the Current Medication list given to you today.   The proper use and anticipated side effects of nitroglycerine has been carefully explained.  If a single episode of chest pain is not relieved by one tablet, the patient will try another within 5 minutes; and if this doesn't relieve the pain, the patient is instructed to call 911 for transportation to an emergency department.   *If you need a refill on your cardiac medications before your next appointment, please call your pharmacy*  Lab Work: None ordered  If you have labs (blood work) drawn today and your tests are completely normal, you will receive your results only by: MyChart Message (if you have MyChart) OR A paper copy in the mail If you have any lab test that is abnormal or we need to change your treatment, we will call you to review the results.  Testing/Procedures: Your physician has requested that you have a cardiac catheterization. Cardiac catheterization is used to diagnose and/or treat various heart conditions. Doctors may recommend this procedure for a number of different reasons. The most common reason is to evaluate chest pain. Chest pain can be a symptom of coronary artery disease (CAD), and cardiac catheterization can show whether plaque is narrowing or blocking your heart's arteries. This procedure is also used to evaluate the valves, as well as measure the blood flow and oxygen  levels in different parts of your heart. For further information please visit [email or web address] Please follow instruction sheet, BELOW:        Cardiac/Peripheral Catheterization   You are scheduled for a Cardiac Catheterization on Friday, November 21 with Dr. Alm Clay.  1. Please arrive at the [facility] (Main Entrance A) at [facility]: [address] [place], [place] [address] at 7:00 AM (This time is  2 hour(s) before your procedure to ensure your preparation).   Free valet parking service is available. You will check in at ADMITTING. The support person will be asked to wait in the waiting room.  It is OK to have someone drop you off and come back when you are ready to be discharged.        Special note: Every effort is made to have your procedure done on time. Please understand that emergencies sometimes delay scheduled procedures.  2. Diet: Nothing to eat after midnight.  3. Hydration:You need to be well hydrated before your procedure. On November 21, you may drink approved liquids (see below) until 2 hours before the procedure, with 16 oz of water as your last intake.   List of approved liquids water, clear juice, clear tea, black coffee, fruit juices, non-citric and without pulp, carbonated beverages, Gatorade, Kool -Aid, plain Jello-O and plain ice popsicles.  4. Labs:   5. Medication instructions in preparation for your procedure:  ELIQUIS  IS CURRENTLY ON HOLD UNTIL FURTHER NOTICE   Stop taking, Cozaar  (Losartan )  and Lasix  (Furosemide ) Thursday, November 20,  ON YOUR INSULIN  PUMP, YOU WILL WANT TO DO 1/2 THE DOSE THE NIGHT BEFORE.. YOU CURRENTLY DO 68 SO YOU WILL  Take only 35 units of insulin  the night before your procedure. Do not take any insulin  on the day of the procedure.  DO NOT TAKE THE MOUNJARO  SHOT THIS SUNDAY 08/30/24.  Contrast Allergy: No  On the morning of your procedure, take Aspirin  81 mg and any morning medicines NOT listed above.  You may use sips  of water.  6. Plan to go home the same day, you will only stay overnight if medically necessary. 7. You MUST have a responsible adult to drive you home. 8. An adult MUST be with you the first 24 hours after you arrive home. 9. Bring a current list of your medications, and the last time and date medication taken. 10. Bring ID and current insurance cards. 11.Please wear clothes that are easy to get on and off and  wear slip-on shoes.  Thank you for allowing us  to care for you!   -- [facility]    Follow-Up: At [facility], you and your health needs are our priority.  As part of our continuing mission to provide you with exceptional heart care, our providers are all part of one team.  This team includes your primary Cardiologist (physician) and Advanced Practice Providers or APPs (Physician Assistants and Nurse Practitioners) who all work together to provide you with the care you need, when you need it.  Your next appointment:   2 week(s)  Provider:   Alm Clay, MD or Aline Door, PA-C          We recommend signing up for the patient portal called MyChart.  Sign up information is provided on this After Visit Summary.  MyChart is used to connect with patients for Virtual Visits (Telemedicine).  Patients are able to view lab/test results, encounter notes, upcoming appointments, etc.  Non-urgent messages can be sent to your provider as well.   To learn more about what you can do with MyChart, go to [email or web address].   Other Instructions

## 2024-08-28 NOTE — Addendum Note (Signed)
 Addended by: WATT RAISIN C on: 08/28/2024 11:18 AM   Modules accepted: Orders

## 2024-08-29 ENCOUNTER — Other Ambulatory Visit: Payer: Self-pay | Admitting: Physician Assistant

## 2024-09-02 ENCOUNTER — Telehealth: Payer: Self-pay | Admitting: *Deleted

## 2024-09-02 NOTE — Telephone Encounter (Signed)
 Cardiac Catheterization scheduled at [facility] for: Friday September 04, 2024 9 AM Arrival time [facility] Main Entrance A at: 7 AM  Diet: -Nothing to eat after midnight.  Hydration: -May drink clear liquids until 2 hours before the procedure.  Approved liquids: Water, clear tea, black coffee, fruit juices-non-citric and without pulp,Gatorade, plain Jello/popsicles.   -Please drink 16 oz of water 2 hours before procedure.  Medication instructions: -Hold:   Insulin  pump-pt will adjust/suspend   Lasix /Aldactone -day before and day of procedure -per protocol GFR <60 (53)   Patient will take this the day before if needed based on her daily weight.   Eliquis -on hold-pt tells me she has not taken in ~ 2 weeks.    -Other usual morning medications can be taken including aspirin  81 mg.  Okay to take aspirin  81 mg the morning of the cath per Dr Anner.  Plan to go home the same day, you will only stay overnight if medically necessary.  You must have responsible adult to drive you home.  Someone must be with you the first 24 hours after you arrive home.  Reviewed procedure instructions with patient.  Patient tells me Mounjaro  is weekly on Sundays.

## 2024-09-04 ENCOUNTER — Encounter (HOSPITAL_COMMUNITY)
Admission: RE | Disposition: A | Discharge status: Home / Self Care | Payer: Self-pay | Source: Home / Self Care | Attending: Cardiology

## 2024-09-04 ENCOUNTER — Other Ambulatory Visit: Payer: Self-pay

## 2024-09-04 ENCOUNTER — Ambulatory Visit (HOSPITAL_BASED_OUTPATIENT_CLINIC_OR_DEPARTMENT_OTHER)
Admission: RE | Admit: 2024-09-04 | Discharge: 2024-09-04 | Disposition: A | Discharge status: Home / Self Care | Source: Home / Self Care | Attending: Cardiology | Admitting: Cardiology

## 2024-09-04 DIAGNOSIS — E785 Hyperlipidemia, unspecified: Secondary | ICD-10-CM | POA: Insufficient documentation

## 2024-09-04 DIAGNOSIS — I480 Paroxysmal atrial fibrillation: Secondary | ICD-10-CM | POA: Insufficient documentation

## 2024-09-04 DIAGNOSIS — I080 Rheumatic disorders of both mitral and aortic valves: Secondary | ICD-10-CM | POA: Insufficient documentation

## 2024-09-04 DIAGNOSIS — D631 Anemia in chronic kidney disease: Secondary | ICD-10-CM | POA: Insufficient documentation

## 2024-09-04 DIAGNOSIS — Z9981 Dependence on supplemental oxygen: Secondary | ICD-10-CM | POA: Insufficient documentation

## 2024-09-04 DIAGNOSIS — I5032 Chronic diastolic (congestive) heart failure: Secondary | ICD-10-CM | POA: Insufficient documentation

## 2024-09-04 DIAGNOSIS — E1122 Type 2 diabetes mellitus with diabetic chronic kidney disease: Secondary | ICD-10-CM | POA: Insufficient documentation

## 2024-09-04 DIAGNOSIS — Z79899 Other long term (current) drug therapy: Secondary | ICD-10-CM | POA: Insufficient documentation

## 2024-09-04 DIAGNOSIS — I2510 Atherosclerotic heart disease of native coronary artery without angina pectoris: Secondary | ICD-10-CM | POA: Insufficient documentation

## 2024-09-04 DIAGNOSIS — Z794 Long term (current) use of insulin: Secondary | ICD-10-CM | POA: Insufficient documentation

## 2024-09-04 DIAGNOSIS — D569 Thalassemia, unspecified: Secondary | ICD-10-CM | POA: Insufficient documentation

## 2024-09-04 DIAGNOSIS — I4719 Other supraventricular tachycardia: Secondary | ICD-10-CM | POA: Insufficient documentation

## 2024-09-04 DIAGNOSIS — J9611 Chronic respiratory failure with hypoxia: Secondary | ICD-10-CM | POA: Insufficient documentation

## 2024-09-04 DIAGNOSIS — N1831 Chronic kidney disease, stage 3a: Secondary | ICD-10-CM | POA: Insufficient documentation

## 2024-09-04 DIAGNOSIS — J189 Pneumonia, unspecified organism: Secondary | ICD-10-CM | POA: Diagnosis not present

## 2024-09-04 DIAGNOSIS — I130 Hypertensive heart and chronic kidney disease with heart failure and stage 1 through stage 4 chronic kidney disease, or unspecified chronic kidney disease: Secondary | ICD-10-CM | POA: Insufficient documentation

## 2024-09-04 DIAGNOSIS — I272 Pulmonary hypertension, unspecified: Secondary | ICD-10-CM | POA: Insufficient documentation

## 2024-09-04 DIAGNOSIS — I2584 Coronary atherosclerosis due to calcified coronary lesion: Secondary | ICD-10-CM | POA: Insufficient documentation

## 2024-09-04 DIAGNOSIS — Z7901 Long term (current) use of anticoagulants: Secondary | ICD-10-CM | POA: Insufficient documentation

## 2024-09-04 DIAGNOSIS — Z7982 Long term (current) use of aspirin: Secondary | ICD-10-CM | POA: Insufficient documentation

## 2024-09-04 DIAGNOSIS — J849 Interstitial pulmonary disease, unspecified: Secondary | ICD-10-CM | POA: Insufficient documentation

## 2024-09-04 DIAGNOSIS — Z8673 Personal history of transient ischemic attack (TIA), and cerebral infarction without residual deficits: Secondary | ICD-10-CM | POA: Insufficient documentation

## 2024-09-04 DIAGNOSIS — G4733 Obstructive sleep apnea (adult) (pediatric): Secondary | ICD-10-CM | POA: Insufficient documentation

## 2024-09-04 HISTORY — PX: RIGHT/LEFT HEART CATH AND CORONARY ANGIOGRAPHY: CATH118266

## 2024-09-04 LAB — POCT I-STAT EG7
Acid-Base Excess: 2 mmol/L (ref 0.0–2.0)
Acid-Base Excess: 5 mmol/L — ABNORMAL HIGH (ref 0.0–2.0)
Acid-Base Excess: 5 mmol/L — ABNORMAL HIGH (ref 0.0–2.0)
Acid-Base Excess: 5 mmol/L — ABNORMAL HIGH (ref 0.0–2.0)
Bicarbonate: 29 mmol/L — ABNORMAL HIGH (ref 20.0–28.0)
Bicarbonate: 31.3 mmol/L — ABNORMAL HIGH (ref 20.0–28.0)
Bicarbonate: 31.5 mmol/L — ABNORMAL HIGH (ref 20.0–28.0)
Bicarbonate: 31.5 mmol/L — ABNORMAL HIGH (ref 20.0–28.0)
Calcium, Ion: 1.22 mmol/L (ref 1.15–1.40)
Calcium, Ion: 1.25 mmol/L (ref 1.15–1.40)
Calcium, Ion: 1.25 mmol/L (ref 1.15–1.40)
Calcium, Ion: 1.26 mmol/L (ref 1.15–1.40)
HCT: 29 % — ABNORMAL LOW (ref 36.0–46.0)
HCT: 31 % — ABNORMAL LOW (ref 36.0–46.0)
HCT: 31 % — ABNORMAL LOW (ref 36.0–46.0)
HCT: 31 % — ABNORMAL LOW (ref 36.0–46.0)
Hemoglobin: 10.5 g/dL — ABNORMAL LOW (ref 12.0–15.0)
Hemoglobin: 10.5 g/dL — ABNORMAL LOW (ref 12.0–15.0)
Hemoglobin: 10.5 g/dL — ABNORMAL LOW (ref 12.0–15.0)
Hemoglobin: 9.9 g/dL — ABNORMAL LOW (ref 12.0–15.0)
O2 Saturation: 51 %
O2 Saturation: 54 %
O2 Saturation: 56 %
O2 Saturation: 61 %
Potassium: 4.1 mmol/L (ref 3.5–5.1)
Potassium: 4.3 mmol/L (ref 3.5–5.1)
Potassium: 4.3 mmol/L (ref 3.5–5.1)
Potassium: 4.3 mmol/L (ref 3.5–5.1)
Sodium: 128 mmol/L — ABNORMAL LOW (ref 135–145)
Sodium: 134 mmol/L — ABNORMAL LOW (ref 135–145)
Sodium: 135 mmol/L (ref 135–145)
Sodium: 135 mmol/L (ref 135–145)
TCO2: 31 mmol/L (ref 22–32)
TCO2: 33 mmol/L — ABNORMAL HIGH (ref 22–32)
TCO2: 33 mmol/L — ABNORMAL HIGH (ref 22–32)
TCO2: 33 mmol/L — ABNORMAL HIGH (ref 22–32)
pCO2, Ven: 54.4 mmHg (ref 44–60)
pCO2, Ven: 54.5 mmHg (ref 44–60)
pCO2, Ven: 55.4 mmHg (ref 44–60)
pCO2, Ven: 56.6 mmHg (ref 44–60)
pH, Ven: 7.317 (ref 7.25–7.43)
pH, Ven: 7.362 (ref 7.25–7.43)
pH, Ven: 7.368 (ref 7.25–7.43)
pH, Ven: 7.369 (ref 7.25–7.43)
pO2, Ven: 29 mmHg — CL (ref 32–45)
pO2, Ven: 30 mmHg — CL (ref 32–45)
pO2, Ven: 33 mmHg (ref 32–45)
pO2, Ven: 34 mmHg (ref 32–45)

## 2024-09-04 LAB — POCT I-STAT 7, (LYTES, BLD GAS, ICA,H+H)
Acid-Base Excess: 5 mmol/L — ABNORMAL HIGH (ref 0.0–2.0)
Bicarbonate: 31.1 mmol/L — ABNORMAL HIGH (ref 20.0–28.0)
Calcium, Ion: 1.24 mmol/L (ref 1.15–1.40)
HCT: 30 % — ABNORMAL LOW (ref 36.0–46.0)
Hemoglobin: 10.2 g/dL — ABNORMAL LOW (ref 12.0–15.0)
O2 Saturation: 91 %
Potassium: 4.3 mmol/L (ref 3.5–5.1)
Sodium: 136 mmol/L (ref 135–145)
TCO2: 33 mmol/L — ABNORMAL HIGH (ref 22–32)
pCO2 arterial: 52.3 mmHg — ABNORMAL HIGH (ref 32–48)
pH, Arterial: 7.382 (ref 7.35–7.45)
pO2, Arterial: 65 mmHg — ABNORMAL LOW (ref 83–108)

## 2024-09-04 LAB — GLUCOSE, CAPILLARY: Glucose-Capillary: 207 mg/dL — ABNORMAL HIGH (ref 70–99)

## 2024-09-04 SURGERY — RIGHT/LEFT HEART CATH AND CORONARY ANGIOGRAPHY
Anesthesia: LOCAL

## 2024-09-04 MED ORDER — LIDOCAINE HCL (PF) 1 % IJ SOLN
INTRAMUSCULAR | Status: AC
  Filled 2024-09-04: qty 30

## 2024-09-04 MED ORDER — LABETALOL HCL 5 MG/ML IV SOLN
INTRAVENOUS | Status: DC | PRN
  Administered 2024-09-04: 10 mg via INTRAVENOUS

## 2024-09-04 MED ORDER — IOHEXOL 350 MG/ML SOLN
INTRAVENOUS | Status: DC | PRN
  Administered 2024-09-04: 55 mL

## 2024-09-04 MED ORDER — VERAPAMIL HCL 2.5 MG/ML IV SOLN
INTRAVENOUS | Status: AC
  Filled 2024-09-04: qty 2

## 2024-09-04 MED ORDER — FUROSEMIDE 10 MG/ML IJ SOLN
80.0000 mg | Freq: Once | INTRAMUSCULAR | Status: AC
  Administered 2024-09-04: 80 mg via INTRAVENOUS
  Filled 2024-09-04: qty 8

## 2024-09-04 MED ORDER — LIDOCAINE HCL (PF) 1 % IJ SOLN
INTRAMUSCULAR | Status: DC | PRN
  Administered 2024-09-04 (×2): 2 mL via INTRADERMAL

## 2024-09-04 MED ORDER — ONDANSETRON HCL 4 MG/2ML IJ SOLN
4.0000 mg | Freq: Four times a day (QID) | INTRAMUSCULAR | Status: DC | PRN
Start: 2024-09-04 — End: 2024-09-04

## 2024-09-04 MED ORDER — MIDAZOLAM HCL 2 MG/2ML IJ SOLN
INTRAMUSCULAR | Status: AC
  Filled 2024-09-04: qty 2

## 2024-09-04 MED ORDER — SODIUM CHLORIDE 0.9% FLUSH: 3.0000 mL | Freq: Two times a day (BID) | INTRAVENOUS | Status: DC

## 2024-09-04 MED ORDER — VERAPAMIL HCL 2.5 MG/ML IV SOLN
INTRAVENOUS | Status: DC | PRN
  Administered 2024-09-04: 10 mL via INTRA_ARTERIAL

## 2024-09-04 MED ORDER — SODIUM CHLORIDE 0.9 % IV SOLN: 250.0000 mL | INTRAVENOUS | Status: DC | PRN

## 2024-09-04 MED ORDER — LABETALOL HCL 5 MG/ML IV SOLN
INTRAVENOUS | Status: AC
  Filled 2024-09-04: qty 4

## 2024-09-04 MED ORDER — SODIUM CHLORIDE 0.9% FLUSH: 3.0000 mL | INTRAVENOUS | Status: DC | PRN

## 2024-09-04 MED ORDER — MIDAZOLAM HCL (PF) 2 MG/2ML IJ SOLN
INTRAMUSCULAR | Status: DC | PRN
Start: 2024-09-04 — End: 2024-09-04
  Administered 2024-09-04: 1 mg via INTRAVENOUS

## 2024-09-04 MED ORDER — LABETALOL HCL 5 MG/ML IV SOLN
10.0000 mg | INTRAVENOUS | Status: DC | PRN
Start: 2024-09-04 — End: 2024-09-04

## 2024-09-04 MED ORDER — ASPIRIN 81 MG PO CHEW: 81.0000 mg | CHEWABLE_TABLET | ORAL | Status: AC

## 2024-09-04 MED ORDER — HEPARIN SODIUM (PORCINE) 1000 UNIT/ML IJ SOLN
INTRAMUSCULAR | Status: DC | PRN
  Administered 2024-09-04: 6000 [IU] via INTRAVENOUS

## 2024-09-04 MED ORDER — HEPARIN SODIUM (PORCINE) 1000 UNIT/ML IJ SOLN
INTRAMUSCULAR | Status: AC
  Filled 2024-09-04: qty 10

## 2024-09-04 MED ORDER — FUROSEMIDE 40 MG PO TABS: ORAL_TABLET | ORAL | 3 refills | Status: AC

## 2024-09-04 MED ORDER — ASPIRIN 81 MG PO CHEW
CHEWABLE_TABLET | ORAL | Status: AC
  Administered 2024-09-04: 81 mg via ORAL
  Filled 2024-09-04: qty 1

## 2024-09-04 MED ORDER — FREE WATER: 250.0000 mL | Freq: Once | Status: DC

## 2024-09-04 MED ORDER — HYDRALAZINE HCL 20 MG/ML IJ SOLN: 10.0000 mg | INTRAMUSCULAR | Status: DC | PRN

## 2024-09-04 MED ORDER — ACETAMINOPHEN 325 MG PO TABS: 650.0000 mg | ORAL_TABLET | ORAL | Status: DC | PRN

## 2024-09-04 MED ORDER — FREE WATER: 500.0000 mL | Freq: Once | Status: DC

## 2024-09-04 MED ORDER — HEPARIN (PORCINE) IN NACL 1000-0.9 UT/500ML-% IV SOLN
INTRAVENOUS | Status: DC | PRN
Start: 2024-09-04 — End: 2024-09-04
  Administered 2024-09-04: 1000 mL

## 2024-09-04 SURGICAL SUPPLY — 10 items
CATH BALLN WEDGE 5F 110CM (CATHETERS) IMPLANT
CATH INFINITI 5FR ANG PIGTAIL (CATHETERS) IMPLANT
CATH INFINITI AMBI 5FR TG (CATHETERS) IMPLANT
DEVICE RAD COMP TR BAND LRG (VASCULAR PRODUCTS) IMPLANT
GLIDESHEATH SLEND SS 6F .021 (SHEATH) IMPLANT
GUIDEWIRE INQWIRE 1.5J.035X260 (WIRE) IMPLANT
PACK CARDIAC CATHETERIZATION (CUSTOM PROCEDURE TRAY) ×2 IMPLANT
SET ATX-X65L (MISCELLANEOUS) IMPLANT
SHEATH GLIDE SLENDER 4/5FR (SHEATH) IMPLANT
SHEATH PROBE COVER 6X72 (BAG) IMPLANT

## 2024-09-04 NOTE — Brief Op Note (Signed)
 BRIEF RIGHT & LEFT HEART CATHETERIZATION NOTE  09/04/2024  10:20 AM  PCP:  Watt [name], MD           Cardiologist:  Alm Clay, MD Cardiology APP:  Madie Jon Garre, PA   ; Aline Door, [place] Pulmonologist: Dr. Geronimo  PATIENT:  Lynn [name]  68 y.o. female with known nonobstructive CAD and chronic diastolic heart failure, paroxysmal/persistent A-fib, moderate mitral stenosis, interstitial lung disease with chronic hypoxic respiratory failure as well as gastric ulcer and nosebleeds (recently required transfusion for anemia).  She was referred by pulmonary medicine for right heart catheterization to assess pulmonary pressures with her ILD.  Since she was also having some chest pain we decided proceed with left heart catheterization coronary angiography as well.  This was also important based on the fact she has underlying mitral stenosis and to fully understand the Right Heart Cath Pressures, LVEDP is also necessary Her initial procedure was delayed due to anemia requiring transfusion.  She has been stable and now returns for rescheduled procedure.  PRE-OPERATIVE DIAGNOSIS:  chest pain; pulmonary hypertension; mitral stenosis  POST-OPERATIVE DIAGNOSIS:   Angiographically minimal CAD with calcified minimal RCA disease over most 40% proximally, 30% eccentric LAD D1 and 20% D2 otherwise no CAD Severe Mixed Pulmonary Hypertension: Transpulmonary gradient near 20 with mean PAP 48 mmHg, PCWP 29 Miller mercury and LVEDP of 21 mmHg. => This is consistent with intrinsic pulmonary hypertension, LV diastolic dysfunction and mitral stenosis. RAP mean 19 mm; RVP-EDP 70/11-19 mmHg; PAP-mean 69/30-48 mm Q; PCWP 29-30 mmHg. PVR 2.88 LVP-EDP 133/10 -21 mmHg; AOP-MAP 130/64-92 mmHg. Ao sat 91%, PA sat 55%.  Normal Fick Cardiac Output-Index: 6.6-3.09  PROCEDURE:  Procedure(s): RIGHT/LEFT HEART CATH AND CORONARY ANGIOGRAPHY (N/A)  SURGEON:  Surgeons and Role:    * Clay Alm ORN, MD - Primary  PROCEDURE PERFORMED Time Out: Verified patient identification, verified procedure, site/side was marked, verified correct patient position, special equipment/implants available, medications/allergies/relevent history reviewed, required imaging and test results available. Performed.  Access:  *Right radial Artery: 6 Fr sheath -- Seldinger technique using glide sheath micropuncture Kit Direct ultrasound guidance used.  Permanent image obtained and placed on chart. 10 mL radial cocktail IA; thousand units IV Heparin  * Right Brachial/Antecubital Vein: The existing 18-gauge IV was exchanged over a wire for a 5Fr  sheath  Right Heart Catheterization: 5 Fr Norva Purl catheter advanced under fluoroscopy with balloon inflated to the RA, RV, then PCWP-PA for hemodynamic measurement.  Simultaneous FA & PA blood gases checked for SaO2% to calculate FICK CO/CI. Catheter removed completely out of the body with balloon deflated.  Left Heart Catheterization: 5 Fr Catheters advanced or exchanged over a J-wire under direct fluoroscopic guidance into the ascending aorta; TIG 4.0 catheter advanced first.  * Left & Right Coronary Artery Cineangiography: TIG 4.0 catheter * LV Hemodynamics (no LV Gram): Angled pigtail catheter  Upon completion of Angiogaphy, the catheter was removed completely out of the body over a wire, without complication.  Brachial Sheath(s) removed in the Cath Lab with manual pressure for hemostasis.    Radial sheath removed in the Cardiac Catheterization lab with TR Band placed for hemostasis.  TR Band: 1010 Hours; 13 mL air  MEDICATIONS Radial Cocktail: 3 mg Verapmil in 10 mL NS Isovue  Contrast: 70 mL Heparin : 6000 units Labetalol  10 mg x 1 ANESTHESIA:   local and IV sedation SQ Lidocaine  3mL; 1 mg IV Versed   EBL:  <50 mL  PATIENT DISPOSITION: The  patient was stable before during and after the procedure.  Postprocedure she was taken to the PACU -  hemodynamically stable.  DICTATION: .Note written in EPIC  PLAN OF CARE: Discharge to home after PACU Will give 80 mg IV Lasix  in the PACU area and increase home dose of Lasix  to 80 mg in the morning with additional 40 mg in the evening. When seen in follow-up we will titrate up after reduction.    Delay start of Pharmacological VTE agent (>24hrs) due to surgical blood loss or risk of bleeding: not applicable    Alm Clay, MD

## 2024-09-04 NOTE — Progress Notes (Signed)
 Discharge instructions reviewed with patient and Husband Gretel  at the bedside. Denies questions or concerns. PT tolerate PO intake without complaints of N/V. was able to void without difficulty using pure wick. Not able to ambulate d/t baseline condition. During recovery time pt developed a small non tender Hematoma, pressure was held for 15 minutes. Site returned soft and boggy. Bed rest extended 30 minutes. Began expelling air from the TR band as original orders instructed. TR Band removed no s/s of complications at the incision site. PT was escorted from the unit via wheel chair to personal vehicle.

## 2024-09-04 NOTE — Discharge Instructions (Signed)

## 2024-09-04 NOTE — Interval H&P Note (Signed)
 History and Physical Interval Note:  09/04/2024 9:19 AM  Lynn [name]  has presented today for surgery, with the diagnosis of chest pain.  The various methods of treatment have been discussed with the patient and family. After consideration of risks, benefits and other options for treatment, the patient has consented to  Procedure(s): RIGHT/LEFT HEART CATH AND CORONARY ANGIOGRAPHY (N/A)  PERCUTANEOUS CORONARY INTERVENTION  as a surgical intervention.  The patient's history has been reviewed, patient examined, no change in status, stable for surgery.  I have reviewed the patient's chart and labs.  Questions were answered to the patient's satisfaction.    Cath Lab Visit (complete for each Cath Lab visit)  Clinical Evaluation Leading to the Procedure:   ACS: No.  Non-ACS:    Anginal Classification: CCS III  Anti-ischemic medical therapy: Minimal Therapy (1 class of medications)  Non-Invasive Test Results: Equivocal test results ongoing symptoms despite negative results  Prior CABG: No previous CABG     Alm Clay

## 2024-09-05 ENCOUNTER — Other Ambulatory Visit: Payer: Self-pay

## 2024-09-05 ENCOUNTER — Emergency Department (HOSPITAL_COMMUNITY)

## 2024-09-05 ENCOUNTER — Encounter (HOSPITAL_COMMUNITY): Payer: Self-pay | Admitting: Cardiology

## 2024-09-05 ENCOUNTER — Inpatient Hospital Stay (HOSPITAL_COMMUNITY)
Admission: EM | Admit: 2024-09-05 | Discharge: 2024-09-08 | DRG: 194 | Disposition: A | Discharge status: Home Health | Attending: Internal Medicine | Admitting: Internal Medicine

## 2024-09-05 DIAGNOSIS — M069 Rheumatoid arthritis, unspecified: Secondary | ICD-10-CM | POA: Diagnosis present

## 2024-09-05 DIAGNOSIS — Z96653 Presence of artificial knee joint, bilateral: Secondary | ICD-10-CM | POA: Diagnosis present

## 2024-09-05 DIAGNOSIS — R7989 Other specified abnormal findings of blood chemistry: Secondary | ICD-10-CM | POA: Insufficient documentation

## 2024-09-05 DIAGNOSIS — Z79899 Other long term (current) drug therapy: Secondary | ICD-10-CM

## 2024-09-05 DIAGNOSIS — I10 Essential (primary) hypertension: Secondary | ICD-10-CM | POA: Diagnosis present

## 2024-09-05 DIAGNOSIS — J440 Chronic obstructive pulmonary disease with acute lower respiratory infection: Secondary | ICD-10-CM | POA: Diagnosis present

## 2024-09-05 DIAGNOSIS — F419 Anxiety disorder, unspecified: Secondary | ICD-10-CM | POA: Diagnosis present

## 2024-09-05 DIAGNOSIS — Z833 Family history of diabetes mellitus: Secondary | ICD-10-CM

## 2024-09-05 DIAGNOSIS — R42 Dizziness and giddiness: Secondary | ICD-10-CM

## 2024-09-05 DIAGNOSIS — Z794 Long term (current) use of insulin: Secondary | ICD-10-CM

## 2024-09-05 DIAGNOSIS — I272 Pulmonary hypertension, unspecified: Secondary | ICD-10-CM | POA: Diagnosis present

## 2024-09-05 DIAGNOSIS — Z818 Family history of other mental and behavioral disorders: Secondary | ICD-10-CM

## 2024-09-05 DIAGNOSIS — Z6841 Body Mass Index (BMI) 40.0 and over, adult: Secondary | ICD-10-CM

## 2024-09-05 DIAGNOSIS — D509 Iron deficiency anemia, unspecified: Secondary | ICD-10-CM | POA: Diagnosis present

## 2024-09-05 DIAGNOSIS — E1122 Type 2 diabetes mellitus with diabetic chronic kidney disease: Secondary | ICD-10-CM | POA: Diagnosis present

## 2024-09-05 DIAGNOSIS — R079 Chest pain, unspecified: Secondary | ICD-10-CM | POA: Diagnosis present

## 2024-09-05 DIAGNOSIS — I5032 Chronic diastolic (congestive) heart failure: Secondary | ICD-10-CM | POA: Diagnosis present

## 2024-09-05 DIAGNOSIS — G4733 Obstructive sleep apnea (adult) (pediatric): Secondary | ICD-10-CM | POA: Diagnosis present

## 2024-09-05 DIAGNOSIS — I4719 Other supraventricular tachycardia: Secondary | ICD-10-CM | POA: Diagnosis present

## 2024-09-05 DIAGNOSIS — R0602 Shortness of breath: Secondary | ICD-10-CM | POA: Diagnosis present

## 2024-09-05 DIAGNOSIS — Z87891 Personal history of nicotine dependence: Secondary | ICD-10-CM

## 2024-09-05 DIAGNOSIS — Z7901 Long term (current) use of anticoagulants: Secondary | ICD-10-CM

## 2024-09-05 DIAGNOSIS — Z8249 Family history of ischemic heart disease and other diseases of the circulatory system: Secondary | ICD-10-CM

## 2024-09-05 DIAGNOSIS — D569 Thalassemia, unspecified: Secondary | ICD-10-CM | POA: Diagnosis present

## 2024-09-05 DIAGNOSIS — I2489 Other forms of acute ischemic heart disease: Secondary | ICD-10-CM | POA: Diagnosis present

## 2024-09-05 DIAGNOSIS — R55 Syncope and collapse: Principal | ICD-10-CM

## 2024-09-05 DIAGNOSIS — Z91148 Patient's other noncompliance with medication regimen for other reason: Secondary | ICD-10-CM

## 2024-09-05 DIAGNOSIS — Z8711 Personal history of peptic ulcer disease: Secondary | ICD-10-CM

## 2024-09-05 DIAGNOSIS — J449 Chronic obstructive pulmonary disease, unspecified: Secondary | ICD-10-CM | POA: Diagnosis present

## 2024-09-05 DIAGNOSIS — I252 Old myocardial infarction: Secondary | ICD-10-CM

## 2024-09-05 DIAGNOSIS — M5430 Sciatica, unspecified side: Secondary | ICD-10-CM | POA: Diagnosis present

## 2024-09-05 DIAGNOSIS — J9611 Chronic respiratory failure with hypoxia: Secondary | ICD-10-CM | POA: Diagnosis present

## 2024-09-05 DIAGNOSIS — I080 Rheumatic disorders of both mitral and aortic valves: Secondary | ICD-10-CM | POA: Diagnosis present

## 2024-09-05 DIAGNOSIS — Z90710 Acquired absence of both cervix and uterus: Secondary | ICD-10-CM

## 2024-09-05 DIAGNOSIS — Z888 Allergy status to other drugs, medicaments and biological substances status: Secondary | ICD-10-CM

## 2024-09-05 DIAGNOSIS — Z8701 Personal history of pneumonia (recurrent): Secondary | ICD-10-CM

## 2024-09-05 DIAGNOSIS — T501X6A Underdosing of loop [high-ceiling] diuretics, initial encounter: Secondary | ICD-10-CM | POA: Diagnosis present

## 2024-09-05 DIAGNOSIS — I480 Paroxysmal atrial fibrillation: Secondary | ICD-10-CM | POA: Diagnosis present

## 2024-09-05 DIAGNOSIS — I2510 Atherosclerotic heart disease of native coronary artery without angina pectoris: Secondary | ICD-10-CM | POA: Diagnosis present

## 2024-09-05 DIAGNOSIS — J189 Pneumonia, unspecified organism: Principal | ICD-10-CM | POA: Diagnosis present

## 2024-09-05 DIAGNOSIS — E7849 Other hyperlipidemia: Secondary | ICD-10-CM | POA: Diagnosis present

## 2024-09-05 DIAGNOSIS — I4819 Other persistent atrial fibrillation: Secondary | ICD-10-CM | POA: Diagnosis present

## 2024-09-05 DIAGNOSIS — Z880 Allergy status to penicillin: Secondary | ICD-10-CM

## 2024-09-05 DIAGNOSIS — G8929 Other chronic pain: Secondary | ICD-10-CM | POA: Diagnosis present

## 2024-09-05 DIAGNOSIS — R040 Epistaxis: Secondary | ICD-10-CM | POA: Diagnosis present

## 2024-09-05 DIAGNOSIS — I700 Atherosclerosis of aorta: Secondary | ICD-10-CM | POA: Diagnosis present

## 2024-09-05 DIAGNOSIS — E1169 Type 2 diabetes mellitus with other specified complication: Secondary | ICD-10-CM | POA: Diagnosis present

## 2024-09-05 DIAGNOSIS — N1831 Chronic kidney disease, stage 3a: Secondary | ICD-10-CM | POA: Diagnosis present

## 2024-09-05 DIAGNOSIS — Z9981 Dependence on supplemental oxygen: Secondary | ICD-10-CM

## 2024-09-05 DIAGNOSIS — Z83438 Family history of other disorder of lipoprotein metabolism and other lipidemia: Secondary | ICD-10-CM

## 2024-09-05 DIAGNOSIS — E66813 Obesity, class 3: Secondary | ICD-10-CM | POA: Diagnosis present

## 2024-09-05 DIAGNOSIS — E785 Hyperlipidemia, unspecified: Secondary | ICD-10-CM | POA: Diagnosis present

## 2024-09-05 DIAGNOSIS — J849 Interstitial pulmonary disease, unspecified: Secondary | ICD-10-CM | POA: Diagnosis present

## 2024-09-05 DIAGNOSIS — Z8673 Personal history of transient ischemic attack (TIA), and cerebral infarction without residual deficits: Secondary | ICD-10-CM

## 2024-09-05 DIAGNOSIS — Z7985 Long-term (current) use of injectable non-insulin antidiabetic drugs: Secondary | ICD-10-CM

## 2024-09-05 DIAGNOSIS — I130 Hypertensive heart and chronic kidney disease with heart failure and stage 1 through stage 4 chronic kidney disease, or unspecified chronic kidney disease: Secondary | ICD-10-CM | POA: Diagnosis present

## 2024-09-05 LAB — HEPATIC FUNCTION PANEL
ALT: 14 U/L (ref 0–44)
AST: 21 U/L (ref 15–41)
Albumin: 3.8 g/dL (ref 3.5–5.0)
Alkaline Phosphatase: 78 U/L (ref 38–126)
Bilirubin, Direct: <0.1 mg/dL (ref 0.0–0.2)
Total Bilirubin: 0.4 mg/dL (ref 0.0–1.2)
Total Protein: 8.6 g/dL — ABNORMAL HIGH (ref 6.5–8.1)

## 2024-09-05 LAB — CBC
HCT: 30.6 % — ABNORMAL LOW (ref 36.0–46.0)
Hemoglobin: 8.9 g/dL — ABNORMAL LOW (ref 12.0–15.0)
MCH: 19.3 pg — ABNORMAL LOW (ref 26.0–34.0)
MCHC: 29.1 g/dL — ABNORMAL LOW (ref 30.0–36.0)
MCV: 66.5 fL — ABNORMAL LOW (ref 80.0–100.0)
Platelets: 283 K/uL (ref 150–400)
RBC: 4.6 MIL/uL (ref 3.87–5.11)
RDW: 20.5 % — ABNORMAL HIGH (ref 11.5–15.5)
WBC: 9.9 K/uL (ref 4.0–10.5)
nRBC: 0 % (ref 0.0–0.2)

## 2024-09-05 LAB — CBG MONITORING, ED: Glucose-Capillary: 108 mg/dL — ABNORMAL HIGH (ref 70–99)

## 2024-09-05 LAB — TROPONIN I (HIGH SENSITIVITY)
Troponin I (High Sensitivity): 25 ng/L — ABNORMAL HIGH (ref <18)
Troponin I (High Sensitivity): 27 ng/L — ABNORMAL HIGH (ref <18)

## 2024-09-05 LAB — BASIC METABOLIC PANEL WITH GFR
Anion gap: 11 (ref 5–15)
BUN: 23 mg/dL (ref 8–23)
CO2: 30 mmol/L (ref 22–32)
Calcium: 9.8 mg/dL (ref 8.9–10.3)
Chloride: 92 mmol/L — ABNORMAL LOW (ref 98–111)
Creatinine, Ser: 1.8 mg/dL — ABNORMAL HIGH (ref 0.44–1.00)
GFR, Estimated: 30 mL/min — ABNORMAL LOW (ref >60)
Glucose, Bld: 96 mg/dL (ref 70–99)
Potassium: 4.3 mmol/L (ref 3.5–5.1)
Sodium: 133 mmol/L — ABNORMAL LOW (ref 135–145)

## 2024-09-05 LAB — LIPASE, BLOOD: Lipase: 27 U/L (ref 11–51)

## 2024-09-05 MED ORDER — IOHEXOL 350 MG/ML SOLN
100.0000 mL | Freq: Once | INTRAVENOUS | Status: AC | PRN
  Administered 2024-09-05: 100 mL via INTRAVENOUS

## 2024-09-05 MED ORDER — LACTATED RINGERS IV BOLUS
500.0000 mL | Freq: Once | INTRAVENOUS | Status: AC
  Administered 2024-09-05: 500 mL via INTRAVENOUS

## 2024-09-05 MED ORDER — FENTANYL CITRATE (PF) 50 MCG/ML IJ SOSY
12.5000 ug | PREFILLED_SYRINGE | Freq: Once | INTRAMUSCULAR | Status: AC
  Administered 2024-09-05: 12.5 ug via INTRAVENOUS
  Filled 2024-09-05: qty 1

## 2024-09-05 NOTE — ED Notes (Signed)
 Patient transported to CT

## 2024-09-05 NOTE — ED Provider Notes (Signed)
 [place] EMERGENCY DEPARTMENT AT [facility] Provider Note   CSN: [id number] Arrival date & time: 09/05/24  1540     Patient presents with: Chest Pain   [name] is a 68 y.o. female.  {Add pertinent medical, surgical, social history, OB history to HPI:32947} HPI     68 year old female with a history of coronary vasospasms, chronic diastolic congestive heart failure, paroxysmal atrial fibrillation on Eliquis , paroxysmal atrial tachycardia, moderate mitral stenosis, interstitial lung disease with chronic hypoxic respiratory failure on 3 L of oxygen , hypertension, hyperlipidemia, type 2 diabetes, CVA, GERD, recurrent GI bleeding from gastric ulcer and AVMs  Has had chest pain before but was mild, this is the worst pain that it has been Spreads from the left to back, down back and right leg, left arm Starts off like a spasm but then it gets deeper and harder Then starts getting dyspnea, feeling faint, then subsides, but this time did not subside until after 2 hours.   Took nitroglycerin  and didn't help with CP but felt worse Had syncopal episode  Now pain is 7-8/10 and then subsides then worsens to a 10  Unreal, hard pain, takes breath away feels dizzy  No nausea or vomiting, did sweat No black bloody stools or diarrhea Severe abdominal pain, felt it yesterday left side, pain she never felt before 10/10  No urinary symptoms  Gave lasix  in IV and took 80mg  this morning, urinating a lot  Just started back taking the eliquis   Past Medical History:  Diagnosis Date   Allergy 2018   Anxiety    CHF (congestive heart failure) (HCC)    Clotting disorder    blood clot in eye 2016   COPD (chronic obstructive pulmonary disease) (HCC)    Coronary artery disease, non-occlusive    a. 11/2016 NSTEMI/Cath: LM nl, LAD 40p, 50md, D1/2 small, LCX 40ost, OM2/3 nl/small, RCA 35 ost/mid, RPDA/RPL small/nl.   Coronary vasospasm    Depression    Diastolic dysfunction    a.  11/2016 Echo: EF 55-60%, gr1 DD, sev calcified MV annulus, mildly dil LA.   Eye hemorrhage 01/2015   right; resolved (07/03/2017)   Gastric ulcer    GERD (gastroesophageal reflux disease)    Headache    monthly (07/03/2017)   Heart murmur    History of blood transfusion    when I had an ectopic pregnancy   History of stomach ulcers    Hyperlipidemia    Hypertension    Microcytic anemia    Moderate mitral stenosis by prior echocardiogram 03/2021   TTE: Mod MS (mean gradient 9 mmHg). -- TEE Moderate-Severe MS (mean gradient 14 mmHg) with fixed Post MV Leaflet & Severe MAC w/ large circumscribed calcified mass on the Post MV Leaflet. (Mass previously described in 2018) - CMRI identifies calcified mass & not Tumor.; R&LHC - LVEDP & PCWP both 23 mmHg indicating minimal resting gradient   Myocardial infarction (HCC) 2023   OSA on CPAP    setting is unknown   Oxygen  deficiency    PAF (paroxysmal atrial fibrillation) (HCC) 03/2021   Event Monitor Aug-Sept 2022: Predominantly sinus rhythm.  1% A. fib burden.  30 brief episodes of PAT.  2 short bursts of NSVT.   PAT (paroxysmal atrial tachycardia)    Event monitor from August 2022 showed 30 brief bursts longest 14 seconds.   Pneumonia    several times (07/03/2017)   PVC's (premature ventricular contractions)    Rheumatoid arthritis (HCC)  Sickle cell anemia (HCC)    Sleep apnea    Stroke [facility])    Syncope 07/03/2017 X 2   called seizures but no medications   Thalassemia    my cells are sickle cell shaped but I don't have sickle cell anemia (07/03/2017)   Type II diabetes mellitus (HCC)      Prior to Admission medications   Medication Sig Start Date End Date Taking? Authorizing Provider  acetaminophen  (TYLENOL ) 500 MG tablet Take 1,000 mg by mouth daily at 12 noon.    [provider]  albuterol  (PROVENTIL ) (2.5 MG/3ML) 0.083% nebulizer solution INHALE 3 ML BY NEBULIZATION EVERY 6 HOURS AS NEEDED FOR WHEEZING OR SHORTNESS  OF BREATH Patient taking differently: Take 2.5 mg by nebulization 2 (two) times daily. 03/30/24   Cobb, Comer GAILS, NP  albuterol  (VENTOLIN  HFA) 108 (90 Base) MCG/ACT inhaler TAKE 2 PUFFS BY MOUTH EVERY 6 HOURS AS NEEDED FOR WHEEZE OR SHORTNESS OF BREATH 07/27/24   Geronimo Amel, MD  ALPRAZolam  (XANAX ) 0.25 MG tablet Take 1 tablet (0.25 mg total) by mouth 2 (two) times daily as needed for anxiety. TAKE 1-2 TABLETS BY MOUTH 2 TIMES DAILY AS NEEDED FOR ANXIETY OR INSOMNIA 06/17/24   Copland, [name], MD  apixaban  (ELIQUIS ) 5 MG TABS tablet Take 1 tablet (5 mg total) by mouth 2 (two) times daily. 08/27/23   Meng, Hao, PA  atorvastatin  (LIPITOR ) 80 MG tablet Take 1 tablet (80 mg total) by mouth every evening. 08/27/23   Meng, Hao, PA  cetirizine (ZYRTEC) 10 MG tablet Take 10 mg by mouth daily as needed for allergies.    [provider]  Continuous Glucose Sensor (DEXCOM G7 SENSOR) MISC 1 Device by Does not apply route as directed. 07/22/24   [name], [name], MD  cyanocobalamin  (VITAMIN B12) 1000 MCG tablet Take 1 tablet (1,000 mcg total) by mouth daily. 08/18/24   [name], MD  diltiazem  (CARDIZEM  CD) 360 MG 24 hr capsule Take 1 capsule (360 mg total) by mouth daily. 09/01/24   [name], MD  diphenhydramine-acetaminophen  (TYLENOL  PM) 25-500 MG TABS tablet Take 2 tablets by mouth at bedtime.    [provider]  ferrous sulfate  325 (65 FE) MG EC tablet Take 1 tablet (325 mg total) by mouth daily with breakfast. 08/18/24 08/18/25  [name], MD  folic acid  (FOLVITE ) 800 MCG tablet Take 800 mcg by mouth daily.    [provider]  furosemide  (LASIX ) 40 MG tablet Take 80 mg by mouth in the morning and an additional 40 mg for an overnight weight gain of 3 pounds or 5 pounds in a week 09/04/24   [name], MD  glucose blood (CONTOUR NEXT TEST) test strip 3x daily 07/20/19   [name], [name], MD  hydroxychloroquine  (PLAQUENIL ) 200 MG  tablet Take 200 mg by mouth 2 (two) times daily. 05/30/22   [provider]  Insulin  Disposable Pump (OMNIPOD 5 G7 PODS, GEN 5,) MISC 1 Device by Other route every other day. 07/22/24   [name], [name], MD  insulin  lispro (HUMALOG ) 100 UNIT/ML injection Max Daily 70 units per pump 07/22/24   [name], [name], MD  isosorbide  mononitrate (IMDUR ) 30 MG 24 hr tablet Take 1 tablet (30 mg total) by mouth daily. 08/27/23   Meng, Hao, PA  Lancets 30G MISC 1 Device by Does not apply route 3 (three) times daily. 07/16/19   Copland, [name], MD  losartan  (COZAAR ) 100 MG tablet Take  0.5 tablets (50 mg total) by mouth daily. 01/20/24   [name], MD  Melatonin 5 MG CHEW Chew 10 mg by mouth at bedtime.    [provider]  Multiple Vitamins-Minerals (HM MULTIVITAMIN ADULT GUMMY PO) Take 2 each by mouth daily.    [provider]  nitroGLYCERIN  (NITROSTAT ) 0.4 MG SL tablet Place 1 tablet (0.4 mg total) under the tongue every 5 (five) minutes as needed for chest pain. 08/27/24 11/25/24  Goodrich, Callie E, PA-C  oxymetazoline  (AFRIN) 0.05 % nasal spray Place 2 sprays into both nostrils every 4 (four) hours as needed (for nose bleed, apply spray and hold pressure.). 08/18/24 08/18/25  Patel, Pranav M, MD  Oxymetazoline  HCl (VICKS SINEX NA) Place 1 spray into both nostrils every 12 (twelve) hours as needed (for congestion- MAX OF THREE CONSECUTIVE DAYS).    [provider]  pantoprazole  (PROTONIX ) 40 MG tablet Take 1 tablet (40 mg total) by mouth daily before breakfast. Patient taking differently: Take 40 mg by mouth at bedtime. 03/03/24   Zehr, Jessica D, PA-C  spironolactone  (ALDACTONE ) 25 MG tablet Take 1 tablet (25 mg total) by mouth daily. 06/08/24 09/06/24  [name], MD  tirzepatide  (MOUNJARO ) 15 MG/0.5ML Pen Inject 15 mg into the skin once a week. Patient not taking: Reported on 08/28/2024 07/22/24   [name], [name], MD  traZODone   (DESYREL ) 50 MG tablet Take 0.5-1 tablets (25-50 mg total) by mouth at bedtime as needed for sleep. Needs appt Patient taking differently: Take 50-100 mg by mouth at bedtime as needed for sleep. Needs appt 06/17/24   Copland, [name], MD    Allergies: Methotrexate, Penicillins, Farxiga  [dapagliflozin ], and Metformin  and related    Review of Systems  Updated Vital Signs BP (!) 102/58   Pulse 70   Temp 98.1 F (36.7 C) (Oral)   Resp 15   Ht 5' 3 (1.6 m)   Wt 115 kg   LMP  (LMP Unknown)   SpO2 100%   BMI 44.91 kg/m   Physical Exam  (all labs ordered are listed, but only abnormal results are displayed) Labs Reviewed  CBC - Abnormal; Notable for the following components:      Result Value   Hemoglobin 8.9 (*)    HCT 30.6 (*)    MCV 66.5 (*)    MCH 19.3 (*)    MCHC 29.1 (*)    RDW 20.5 (*)    All other components within normal limits  BASIC METABOLIC PANEL WITH GFR  TROPONIN I (HIGH SENSITIVITY)    EKG: None  Radiology: DG Chest 2 View Result Date: 09/05/2024 CLINICAL DATA:  Chest pain. EXAM: CHEST - 2 VIEW COMPARISON:  Chest radiograph dated 07/19/2023. FINDINGS: Mild cardiomegaly with mild vascular congestion. No focal consolidation, pleural effusion pneumothorax or acute osseous pathology. IMPRESSION: Mild cardiomegaly with mild vascular congestion. Electronically Signed   By: Vanetta Chou M.D.   On: 09/05/2024 16:39   CARDIAC CATHETERIZATION Result Date: 09/04/2024 Images from the original result were not included.   Prox LAD to Mid LAD lesion is 25% stenosed with 25% stenosed side branch in 2nd Diag.  Mid LAD to Dist LAD lesion is 30% stenosed.   Ost Cx lesion is 40% stenosed.   Ost RCA to Prox RCA lesion is 40% stenosed. Prox RCA to Mid RCA lesion is 20% stenosed.   Hemodynamic findings consistent with severe pulmonary hypertension.   There is mild aortic valve stenosis.   There is moderate mitral  valve stenosis. Dominance: Right POST-OPERATIVE DIAGNOSIS:   Angiographically minimal CAD with calcified minimal RCA disease over most 40% proximally, 30% eccentric LAD D1 and 20% D2 otherwise no CAD Severe Mixed Pulmonary Hypertension: Transpulmonary gradient near 20 with mean PAP 48 mmHg, PCWP 29 Miller mercury and LVEDP of 21 mmHg. => This is consistent with intrinsic pulmonary hypertension, LV diastolic dysfunction and mitral stenosis. RAP mean 19 mm; RVP-EDP 70/11-19 mmHg; PAP-mean 69/30-48 mm Q; PCWP 29-30 mmHg. PVR 2.88. LVP-EDP 133/10 -21 mmHg; AOP-MAP 130/64-92 mmHg. Ao sat 91%, PA sat 55%.  Normal Fick Cardiac Output-Index: 6.6-3.09 PLAN   In the absence of any other complications or medical issues, we expect the patient to be ready for discharge from a cath perspective on 09/04/2024.   Plan is to titrate up her diuretic-increased from 40 mg in the morning to 80 mg.  (80 mg IV given in the PACU holding area); Continue to titrate GDMT for afterload reduction/treatment of diastolic heart failure. Continue to counsel the patient about decreasing salt intake. Continue home oxygen    Recommend Aspirin  81mg  daily for moderate CAD. Alm Clay, MD   {Document cardiac monitor, telemetry assessment procedure when appropriate:32947} Procedures   Medications Ordered in the ED - No data to display    {Click here for ABCD2, HEART and other calculators REFRESH Note before signing:1}                              Medical Decision Making Amount and/or Complexity of Data Reviewed Labs: ordered. Radiology: ordered.  Risk Prescription drug management.   ***  {Document critical care time when appropriate  Document review of labs and clinical decision tools ie [id number], etc  Document your independent review of radiology images and any outside records  Document your discussion with family members, caretakers and with consultants  Document social determinants of health affecting pt's care  Document your decision making why or why not admission, treatments  were needed:32947:::1}   Final diagnoses:  None    ED Discharge Orders     None

## 2024-09-05 NOTE — ED Triage Notes (Signed)
 Pt arrives via EMS from home with reports of CP. Pt had cath yesterday that showed some calcification. Pt endorses dizziness. Pt reports taking 1 nitroglycerin  today.

## 2024-09-06 ENCOUNTER — Encounter (HOSPITAL_COMMUNITY): Payer: Self-pay

## 2024-09-06 ENCOUNTER — Inpatient Hospital Stay (HOSPITAL_COMMUNITY)

## 2024-09-06 DIAGNOSIS — R079 Chest pain, unspecified: Secondary | ICD-10-CM

## 2024-09-06 DIAGNOSIS — J189 Pneumonia, unspecified organism: Secondary | ICD-10-CM | POA: Diagnosis present

## 2024-09-06 DIAGNOSIS — D569 Thalassemia, unspecified: Secondary | ICD-10-CM | POA: Diagnosis present

## 2024-09-06 DIAGNOSIS — I080 Rheumatic disorders of both mitral and aortic valves: Secondary | ICD-10-CM | POA: Diagnosis present

## 2024-09-06 DIAGNOSIS — I2489 Other forms of acute ischemic heart disease: Secondary | ICD-10-CM | POA: Diagnosis present

## 2024-09-06 DIAGNOSIS — R7989 Other specified abnormal findings of blood chemistry: Secondary | ICD-10-CM | POA: Insufficient documentation

## 2024-09-06 DIAGNOSIS — F419 Anxiety disorder, unspecified: Secondary | ICD-10-CM | POA: Diagnosis present

## 2024-09-06 DIAGNOSIS — J9611 Chronic respiratory failure with hypoxia: Secondary | ICD-10-CM | POA: Diagnosis present

## 2024-09-06 DIAGNOSIS — N1831 Chronic kidney disease, stage 3a: Secondary | ICD-10-CM | POA: Diagnosis present

## 2024-09-06 DIAGNOSIS — I5032 Chronic diastolic (congestive) heart failure: Secondary | ICD-10-CM | POA: Diagnosis present

## 2024-09-06 DIAGNOSIS — Z9981 Dependence on supplemental oxygen: Secondary | ICD-10-CM | POA: Diagnosis not present

## 2024-09-06 DIAGNOSIS — J440 Chronic obstructive pulmonary disease with acute lower respiratory infection: Secondary | ICD-10-CM | POA: Diagnosis present

## 2024-09-06 DIAGNOSIS — E1122 Type 2 diabetes mellitus with diabetic chronic kidney disease: Secondary | ICD-10-CM | POA: Diagnosis present

## 2024-09-06 DIAGNOSIS — G4733 Obstructive sleep apnea (adult) (pediatric): Secondary | ICD-10-CM | POA: Diagnosis present

## 2024-09-06 DIAGNOSIS — I4819 Other persistent atrial fibrillation: Secondary | ICD-10-CM | POA: Diagnosis present

## 2024-09-06 DIAGNOSIS — D509 Iron deficiency anemia, unspecified: Secondary | ICD-10-CM | POA: Diagnosis present

## 2024-09-06 DIAGNOSIS — M069 Rheumatoid arthritis, unspecified: Secondary | ICD-10-CM | POA: Diagnosis present

## 2024-09-06 DIAGNOSIS — Z794 Long term (current) use of insulin: Secondary | ICD-10-CM | POA: Diagnosis not present

## 2024-09-06 DIAGNOSIS — E1169 Type 2 diabetes mellitus with other specified complication: Secondary | ICD-10-CM | POA: Diagnosis present

## 2024-09-06 DIAGNOSIS — I700 Atherosclerosis of aorta: Secondary | ICD-10-CM | POA: Diagnosis present

## 2024-09-06 DIAGNOSIS — Z7901 Long term (current) use of anticoagulants: Secondary | ICD-10-CM | POA: Diagnosis not present

## 2024-09-06 DIAGNOSIS — I4719 Other supraventricular tachycardia: Secondary | ICD-10-CM | POA: Diagnosis present

## 2024-09-06 DIAGNOSIS — I272 Pulmonary hypertension, unspecified: Secondary | ICD-10-CM | POA: Diagnosis present

## 2024-09-06 DIAGNOSIS — Z6841 Body Mass Index (BMI) 40.0 and over, adult: Secondary | ICD-10-CM | POA: Diagnosis not present

## 2024-09-06 DIAGNOSIS — E66813 Obesity, class 3: Secondary | ICD-10-CM | POA: Diagnosis present

## 2024-09-06 DIAGNOSIS — I130 Hypertensive heart and chronic kidney disease with heart failure and stage 1 through stage 4 chronic kidney disease, or unspecified chronic kidney disease: Secondary | ICD-10-CM | POA: Diagnosis present

## 2024-09-06 LAB — CBC WITH DIFFERENTIAL/PLATELET
Abs Immature Granulocytes: 0.02 K/uL (ref 0.00–0.07)
Basophils Absolute: 0 K/uL (ref 0.0–0.1)
Basophils Relative: 0 %
Eosinophils Absolute: 0 K/uL (ref 0.0–0.5)
Eosinophils Relative: 0 %
HCT: 30.6 % — ABNORMAL LOW (ref 36.0–46.0)
Hemoglobin: 8.6 g/dL — ABNORMAL LOW (ref 12.0–15.0)
Immature Granulocytes: 0 %
Lymphocytes Relative: 12 %
Lymphs Abs: 1.2 K/uL (ref 0.7–4.0)
MCH: 19.7 pg — ABNORMAL LOW (ref 26.0–34.0)
MCHC: 28.1 g/dL — ABNORMAL LOW (ref 30.0–36.0)
MCV: 70.2 fL — ABNORMAL LOW (ref 80.0–100.0)
Monocytes Absolute: 0.9 K/uL (ref 0.1–1.0)
Monocytes Relative: 8 %
Neutro Abs: 8.4 K/uL — ABNORMAL HIGH (ref 1.7–7.7)
Neutrophils Relative %: 80 %
Platelets: 306 K/uL (ref 150–400)
RBC: 4.36 MIL/uL (ref 3.87–5.11)
RDW: 20.6 % — ABNORMAL HIGH (ref 11.5–15.5)
WBC: 10.6 K/uL — ABNORMAL HIGH (ref 4.0–10.5)
nRBC: 0 % (ref 0.0–0.2)

## 2024-09-06 LAB — ECHOCARDIOGRAM COMPLETE
AR max vel: 2.13 cm2
AV Area VTI: 2.34 cm2
AV Area mean vel: 2.19 cm2
AV Mean grad: 8 mmHg
AV Peak grad: 14.6 mmHg
Ao pk vel: 1.91 m/s
Area-P 1/2: 2.22 cm2
Height: 63 in
MV VTI: 1.05 cm2
S' Lateral: 2.3 cm
Weight: 4056.46 [oz_av]

## 2024-09-06 LAB — BASIC METABOLIC PANEL WITH GFR
Anion gap: 9 (ref 5–15)
BUN: 24 mg/dL — ABNORMAL HIGH (ref 8–23)
CO2: 33 mmol/L — ABNORMAL HIGH (ref 22–32)
Calcium: 9.5 mg/dL (ref 8.9–10.3)
Chloride: 93 mmol/L — ABNORMAL LOW (ref 98–111)
Creatinine, Ser: 1.8 mg/dL — ABNORMAL HIGH (ref 0.44–1.00)
GFR, Estimated: 30 mL/min — ABNORMAL LOW (ref >60)
Glucose, Bld: 193 mg/dL — ABNORMAL HIGH (ref 70–99)
Potassium: 4.4 mmol/L (ref 3.5–5.1)
Sodium: 135 mmol/L (ref 135–145)

## 2024-09-06 LAB — CBC
HCT: 27.3 % — ABNORMAL LOW (ref 36.0–46.0)
Hemoglobin: 8 g/dL — ABNORMAL LOW (ref 12.0–15.0)
MCH: 19.3 pg — ABNORMAL LOW (ref 26.0–34.0)
MCHC: 29.3 g/dL — ABNORMAL LOW (ref 30.0–36.0)
MCV: 65.9 fL — ABNORMAL LOW (ref 80.0–100.0)
Platelets: 265 K/uL (ref 150–400)
RBC: 4.14 MIL/uL (ref 3.87–5.11)
RDW: 20.1 % — ABNORMAL HIGH (ref 11.5–15.5)
WBC: 11 K/uL — ABNORMAL HIGH (ref 4.0–10.5)
nRBC: 0 % (ref 0.0–0.2)

## 2024-09-06 LAB — PROCALCITONIN: Procalcitonin: <0.1 ng/mL

## 2024-09-06 LAB — CREATININE, SERUM
Creatinine, Ser: 1.67 mg/dL — ABNORMAL HIGH (ref 0.44–1.00)
GFR, Estimated: 33 mL/min — ABNORMAL LOW (ref >60)

## 2024-09-06 LAB — CBG MONITORING, ED
Glucose-Capillary: 152 mg/dL — ABNORMAL HIGH (ref 70–99)
Glucose-Capillary: 191 mg/dL — ABNORMAL HIGH (ref 70–99)

## 2024-09-06 LAB — GLUCOSE, CAPILLARY
Glucose-Capillary: 201 mg/dL — ABNORMAL HIGH (ref 70–99)
Glucose-Capillary: 181 mg/dL — ABNORMAL HIGH (ref 70–99)

## 2024-09-06 LAB — HEMOGLOBIN A1C
Hgb A1c MFr Bld: 7.5 % — ABNORMAL HIGH (ref 4.8–5.6)
Mean Plasma Glucose: 168.55 mg/dL

## 2024-09-06 LAB — LACTIC ACID, PLASMA: Lactic Acid, Venous: 1.3 mmol/L (ref 0.5–1.9)

## 2024-09-06 LAB — BRAIN NATRIURETIC PEPTIDE: B Natriuretic Peptide: 78.8 pg/mL (ref 0.0–100.0)

## 2024-09-06 MED ORDER — INSULIN GLARGINE-YFGN 100 UNIT/ML ~~LOC~~ SOLN
20.0000 [IU] | Freq: Every day | SUBCUTANEOUS | Status: DC
  Administered 2024-09-06 – 2024-09-07 (×3): 20 [IU] via SUBCUTANEOUS
  Filled 2024-09-06 (×4): qty 0.2

## 2024-09-06 MED ORDER — ENOXAPARIN SODIUM 40 MG/0.4ML IJ SOSY
40.0000 mg | PREFILLED_SYRINGE | INTRAMUSCULAR | Status: DC
  Administered 2024-09-06: 40 mg via SUBCUTANEOUS
  Filled 2024-09-06: qty 0.4

## 2024-09-06 MED ORDER — ALBUTEROL SULFATE (2.5 MG/3ML) 0.083% IN NEBU
2.5000 mg | INHALATION_SOLUTION | Freq: Two times a day (BID) | RESPIRATORY_TRACT | Status: DC
  Administered 2024-09-06: 2.5 mg via RESPIRATORY_TRACT
  Filled 2024-09-06: qty 3

## 2024-09-06 MED ORDER — SODIUM CHLORIDE 0.9 % IV SOLN: INTRAVENOUS | Status: DC

## 2024-09-06 MED ORDER — HYDROXYCHLOROQUINE SULFATE 200 MG PO TABS
200.0000 mg | ORAL_TABLET | Freq: Every day | ORAL | Status: DC
  Administered 2024-09-06 – 2024-09-07 (×2): 200 mg via ORAL
  Filled 2024-09-06 (×3): qty 1

## 2024-09-06 MED ORDER — SODIUM CHLORIDE 0.9 % IV SOLN: INTRAVENOUS | Status: AC

## 2024-09-06 MED ORDER — LORATADINE 10 MG PO TABS
10.0000 mg | ORAL_TABLET | Freq: Every day | ORAL | Status: DC
  Administered 2024-09-06 – 2024-09-08 (×3): 10 mg via ORAL
  Filled 2024-09-06 (×3): qty 1

## 2024-09-06 MED ORDER — VANCOMYCIN HCL 2000 MG/400ML IV SOLN
2000.0000 mg | Freq: Once | INTRAVENOUS | Status: AC
  Administered 2024-09-06: 2000 mg via INTRAVENOUS
  Filled 2024-09-06: qty 400

## 2024-09-06 MED ORDER — APIXABAN 5 MG PO TABS
5.0000 mg | ORAL_TABLET | Freq: Two times a day (BID) | ORAL | Status: DC
  Administered 2024-09-06 – 2024-09-08 (×4): 5 mg via ORAL
  Filled 2024-09-06 (×4): qty 1

## 2024-09-06 MED ORDER — FERROUS SULFATE 325 (65 FE) MG PO TABS
325.0000 mg | ORAL_TABLET | Freq: Every day | ORAL | Status: DC
  Administered 2024-09-06 – 2024-09-08 (×3): 325 mg via ORAL
  Filled 2024-09-06 (×3): qty 1

## 2024-09-06 MED ORDER — PANTOPRAZOLE SODIUM 40 MG PO TBEC
40.0000 mg | DELAYED_RELEASE_TABLET | Freq: Every day | ORAL | Status: DC
  Administered 2024-09-06 – 2024-09-08 (×3): 40 mg via ORAL
  Filled 2024-09-06 (×3): qty 1

## 2024-09-06 MED ORDER — VITAMIN B-12 1000 MCG PO TABS
1000.0000 ug | ORAL_TABLET | Freq: Every day | ORAL | Status: DC
  Administered 2024-09-06 – 2024-09-08 (×3): 1000 ug via ORAL
  Filled 2024-09-06 (×3): qty 1

## 2024-09-06 MED ORDER — ATORVASTATIN CALCIUM 80 MG PO TABS
80.0000 mg | ORAL_TABLET | Freq: Every evening | ORAL | Status: DC
  Administered 2024-09-06 – 2024-09-07 (×2): 80 mg via ORAL
  Filled 2024-09-06 (×2): qty 1

## 2024-09-06 MED ORDER — ACETAMINOPHEN 325 MG PO TABS: 650.0000 mg | ORAL_TABLET | ORAL | Status: DC | PRN

## 2024-09-06 MED ORDER — FOLIC ACID 1 MG PO TABS
1.0000 mg | ORAL_TABLET | Freq: Every day | ORAL | Status: DC
  Administered 2024-09-06 – 2024-09-08 (×3): 1 mg via ORAL
  Filled 2024-09-06 (×3): qty 1

## 2024-09-06 MED ORDER — INSULIN ASPART 100 UNIT/ML IJ SOLN: 0.0000 [IU] | Freq: Every day | INTRAMUSCULAR | Status: DC

## 2024-09-06 MED ORDER — VANCOMYCIN HCL 750 MG/150ML IV SOLN
750.0000 mg | INTRAVENOUS | Status: DC
  Administered 2024-09-07: 750 mg via INTRAVENOUS
  Filled 2024-09-06 (×2): qty 150

## 2024-09-06 MED ORDER — IPRATROPIUM-ALBUTEROL 0.5-2.5 (3) MG/3ML IN SOLN: 3.0000 mL | Freq: Four times a day (QID) | RESPIRATORY_TRACT | Status: DC | PRN

## 2024-09-06 MED ORDER — SODIUM CHLORIDE 0.9 % IV SOLN
500.0000 mg | INTRAVENOUS | Status: DC
  Administered 2024-09-06 – 2024-09-07 (×2): 500 mg via INTRAVENOUS
  Filled 2024-09-06 (×2): qty 5

## 2024-09-06 MED ORDER — DILTIAZEM HCL ER COATED BEADS 180 MG PO CP24: 360.0000 mg | ORAL_CAPSULE | Freq: Every day | ORAL | Status: DC

## 2024-09-06 MED ORDER — INSULIN ASPART 100 UNIT/ML IJ SOLN
0.0000 [IU] | Freq: Three times a day (TID) | INTRAMUSCULAR | Status: DC
  Administered 2024-09-06 (×2): 3 [IU] via SUBCUTANEOUS
  Administered 2024-09-06 – 2024-09-07 (×2): 5 [IU] via SUBCUTANEOUS
  Administered 2024-09-07 (×2): 3 [IU] via SUBCUTANEOUS
  Administered 2024-09-08: 8 [IU] via SUBCUTANEOUS
  Filled 2024-09-06: qty 5
  Filled 2024-09-06: qty 8
  Filled 2024-09-06: qty 3
  Filled 2024-09-06: qty 5
  Filled 2024-09-06: qty 1
  Filled 2024-09-06: qty 3

## 2024-09-06 MED ORDER — TRAZODONE HCL 50 MG PO TABS
50.0000 mg | ORAL_TABLET | Freq: Every evening | ORAL | Status: DC | PRN
  Administered 2024-09-06: 50 mg via ORAL
  Filled 2024-09-06: qty 2

## 2024-09-06 MED ORDER — OXYCODONE HCL 5 MG PO TABS
5.0000 mg | ORAL_TABLET | Freq: Four times a day (QID) | ORAL | Status: DC | PRN
Start: 2024-09-06 — End: 2024-09-06

## 2024-09-06 MED ORDER — GUAIFENESIN 100 MG/5ML PO LIQD
5.0000 mL | ORAL | Status: DC | PRN
  Administered 2024-09-08: 5 mL via ORAL
  Filled 2024-09-06: qty 10

## 2024-09-06 MED ORDER — ONDANSETRON HCL 4 MG/2ML IJ SOLN
4.0000 mg | Freq: Four times a day (QID) | INTRAMUSCULAR | Status: DC | PRN
Start: 2024-09-06 — End: 2024-09-08

## 2024-09-06 MED ORDER — MORPHINE SULFATE (PF) 2 MG/ML IV SOLN: 2.0000 mg | INTRAVENOUS | Status: DC | PRN

## 2024-09-06 MED ORDER — ALPRAZOLAM 0.25 MG PO TABS
0.2500 mg | ORAL_TABLET | Freq: Two times a day (BID) | ORAL | Status: DC | PRN
Start: 2024-09-06 — End: 2024-09-08

## 2024-09-06 MED ORDER — SODIUM CHLORIDE 0.9 % IV SOLN
2.0000 g | INTRAVENOUS | Status: DC
  Administered 2024-09-07: 2 g via INTRAVENOUS
  Filled 2024-09-06: qty 20

## 2024-09-06 MED ORDER — MORPHINE SULFATE (PF) 2 MG/ML IV SOLN
2.0000 mg | INTRAVENOUS | Status: DC | PRN
Start: 2024-09-06 — End: 2024-09-06

## 2024-09-06 MED ORDER — SODIUM CHLORIDE 0.9 % IV SOLN
2.0000 g | Freq: Two times a day (BID) | INTRAVENOUS | Status: DC
  Administered 2024-09-06: 2 g via INTRAVENOUS
  Filled 2024-09-06: qty 12.5

## 2024-09-06 MED ORDER — HYDRALAZINE HCL 20 MG/ML IJ SOLN: 5.0000 mg | Freq: Four times a day (QID) | INTRAMUSCULAR | Status: DC | PRN

## 2024-09-06 MED ORDER — MELATONIN 5 MG PO TABS
10.0000 mg | ORAL_TABLET | Freq: Every day | ORAL | Status: DC
  Administered 2024-09-06 – 2024-09-07 (×3): 10 mg via ORAL
  Filled 2024-09-06 (×3): qty 2

## 2024-09-06 MED ORDER — ALBUTEROL SULFATE HFA 108 (90 BASE) MCG/ACT IN AERS: 2.0000 | INHALATION_SPRAY | Freq: Four times a day (QID) | RESPIRATORY_TRACT | Status: DC | PRN

## 2024-09-06 MED ORDER — OXYCODONE HCL 5 MG PO TABS
5.0000 mg | ORAL_TABLET | Freq: Four times a day (QID) | ORAL | Status: DC | PRN
  Administered 2024-09-06 – 2024-09-08 (×3): 5 mg via ORAL
  Filled 2024-09-06 (×3): qty 1

## 2024-09-06 NOTE — Assessment & Plan Note (Signed)
 Home alprazolam  0.25 mg p.o. twice daily as needed for anxiety resumed Trazodone  50-100 mg nightly as needed for sleep

## 2024-09-06 NOTE — Assessment & Plan Note (Addendum)
 Suspect demand ischemia, low clinical suspicion for ACS

## 2024-09-06 NOTE — Assessment & Plan Note (Signed)
 Home hydroxychloroquine  200 mg nightly resumed

## 2024-09-06 NOTE — Assessment & Plan Note (Addendum)
 With chest pain Suspect secondary to right lower lobe pneumonia, treat per above

## 2024-09-06 NOTE — Plan of Care (Signed)
  Problem: Education: Goal: Understanding of cardiac disease, CV risk reduction, and recovery process will improve Outcome: Progressing   

## 2024-09-06 NOTE — Progress Notes (Signed)
 PROGRESS NOTE  Lynn [name]  FMW:[id number] DOB: 04/27/1956 DOA: 09/05/2024 PCP: Watt [name], MD   [name] is a 68 year old female with history of NIDDM2, paroxysmal atrial fibrillation, rheumatoid arthritis, OSA on CPAP, pulmonary hypertension, hyperlipidemia, hypertension, GERD, morbid obesity, who presents to ED for chief concerns of chest pain.   Patient presented to the ED on 09/05/2024.  09/05/2024: Patient admitted to [facility] for chest pain suspect secondary to right lower lobe pneumonia. 11/22: Assumed care.  11/23: Added azithromycin  500 mg IV, discontinue cefepime .  Ordered ceftriaxone  2 g IV daily.  Assessment & Plan:   Principal Problem:   CAP (community acquired pneumonia) Active Problems:   ILD (interstitial lung disease) (HCC)   Coronary artery disease, non-occlusive   Chronic respiratory failure with hypoxia (HCC)   COPD (chronic obstructive pulmonary disease) (HCC)   Essential hypertension   Type 2 diabetes mellitus with hyperlipidemia (HCC)   PAF (paroxysmal atrial fibrillation) (HCC); CHA2DS2-VASc score 7   Dyslipidemia   Obesity, Class III, BMI 40-49.9 (morbid obesity) (HCC)   Rheumatoid arthritis (HCC)   PAT (paroxysmal atrial tachycardia)   Anxiety   OSA on CPAP   Shortness of breath   IDA (iron deficiency anemia)   Chest pain   Elevated troponin   Assessment and Plan:  * CAP (community acquired pneumonia) Azithromycin  500 mg IV daily, ceftriaxone  2 g IV daily to complete a 5-day course Discontinue cefepime  Check MRSA PCR, message nursing.  If negative vancomycin  will be discontinued  ILD (interstitial lung disease) (HCC) Not in exacerbation  DuoNebs every 6 hours.  For wheezing and shortness of breath.  COPD (chronic obstructive pulmonary disease) (HCC) Not in acute exacerbation  Obesity, Class III, BMI 40-49.9 (morbid obesity) (HCC) This complicates overall care and prognosis.    Dyslipidemia Home atorvastatin  80 mg every evening resumed  Type 2 diabetes mellitus with hyperlipidemia (HCC) Home long-acting insulin  equivalent 20 units nightly resumed Insulin  SSI Goal inpatient blood glucose levels 140-180  Essential hypertension Hydralazine  5 mg IV every 6 hours as needed for SBP greater 165, 5 days ordered  Rheumatoid arthritis (HCC) Home hydroxychloroquine  200 mg nightly resumed  Elevated troponin Suspect demand ischemia, low clinical suspicion for ACS  Chest pain Generalized body aches, secondary to pneumonia  Shortness of breath With chest pain Suspect secondary to right lower lobe pneumonia, treat per above  OSA on CPAP CPAP nightly ordered  Anxiety Home alprazolam  0.25 mg p.o. twice daily as needed for anxiety resumed Trazodone  50-100 mg nightly as needed for sleep  DVT prophylaxis: Apixaban  5 mg p.o. twice daily  Code Status: full code Family Communication: No.  A phone call was offered, patient declined stating that when she gets to her room, she will let her family know herself. Disposition Plan: Clinical course Level of care: Telemetry  Consultants:  Pharmacy  Procedures:  None at this time  Antimicrobials: 11/22: Vancomycin  and cefepime  11/23: Cefepime  discontinued, initiated azithromycin  500 mg IV daily and ceftriaxone  2 g IV daily to complete a 5-day course.  Subjective:  At bedside, patient able to tell me her first and last name, age, location, current calendar year.    Patient reports this started yesterday and is currently much better She endorses some cough as well that started 2 or 3 days ago. She reports that she was hurting all over, endorsing generalized body aches.  She denies trauma to her person or known sick contacts.  Objective: Vitals:   09/06/24 1345 09/06/24 1400  09/06/24 1415 09/06/24 1445  BP: 131/71 130/73 130/64 122/61  Pulse: 92 85 84 93  Resp: 11 20 (!) 21 18  Temp:      TempSrc:      SpO2:  100% 100% 100% 98%  Weight:      Height:        Intake/Output Summary (Last 24 hours) at 09/06/2024 1451 Last data filed at 09/05/2024 1908 Gross per 24 hour  Intake 500 ml  Output --  Net 500 ml   Filed Weights   09/05/24 1547  Weight: 115 kg   Examination:  General exam: Appears calm and comfortable  Respiratory system: Decreased lung sounds in the right lower lobe, clear to auscultation. Respiratory effort normal. Cardiovascular system: S1 & S2 heard, RRR. No JVD, murmurs, rubs, gallops or clicks. No pedal edema. Gastrointestinal system: Abdomen is nondistended, soft and nontender. No organomegaly or masses felt. Normal bowel sounds heard. Central nervous system: Alert and oriented. No focal neurological deficits. Extremities: Symmetric 5 x 5 power. Skin: No rashes, lesions or ulcers Psychiatry: Judgement and insight appear normal. Mood & affect appropriate.   Data Reviewed: I have personally reviewed following labs and imaging studies  CBC: Recent Labs  Lab 09/04/24 0947 09/04/24 0959 09/05/24 1558 09/06/24 0102 09/06/24 0209  WBC  --   --  9.9 11.0* 10.6*  NEUTROABS  --   --   --   --  8.4*  HGB 10.5* 9.9* 8.9* 8.0* 8.6*  HCT 31.0* 29.0* 30.6* 27.3* 30.6*  MCV  --   --  66.5* 65.9* 70.2*  PLT  --   --  283 265 306   Basic Metabolic Panel: Recent Labs  Lab 09/04/24 0942 09/04/24 0947 09/04/24 0959 09/05/24 1742 09/06/24 0102 09/06/24 0209  NA 128* 135 134* 133*  --  135  K 4.1 4.3 4.3 4.3  --  4.4  CL  --   --   --  92*  --  93*  CO2  --   --   --  30  --  33*  GLUCOSE  --   --   --  96  --  193*  BUN  --   --   --  23  --  24*  CREATININE  --   --   --  1.80* 1.67* 1.80*  CALCIUM   --   --   --  9.8  --  9.5   GFR: Estimated Creatinine Clearance: 35.5 mL/min (A) (by C-G formula based on SCr of 1.8 mg/dL (H)).  Liver Function Tests: Recent Labs  Lab 09/05/24 1704  AST 21  ALT 14  ALKPHOS 78  BILITOT 0.4  PROT 8.6*  ALBUMIN 3.8   Recent  Labs  Lab 09/05/24 1704  LIPASE 27   HbA1C: Recent Labs    09/06/24 0209  HGBA1C 7.5*   CBG: Recent Labs  Lab 09/04/24 0754 09/05/24 1743 09/06/24 0754 09/06/24 1140  GLUCAP 207* 108* 152* 191*   Sepsis Labs: Recent Labs  Lab 09/06/24 0209 09/06/24 0952  PROCALCITON <0.10  --   LATICACIDVEN  --  1.3   No results found for this or any previous visit (from the past 240 hours).   Radiology Studies: ECHOCARDIOGRAM COMPLETE Result Date: 09/06/2024    ECHOCARDIOGRAM REPORT   Patient Name:   Sharae Zappulla Date of Exam: 09/06/2024 Medical Rec #:  [id number]               Height:  63.0 in Accession #:    [id number]              Weight:       253.5 lb Date of Birth:  12-04-1955                BSA:          2.139 m Patient Age:    68 years                BP:           132/70 mmHg Patient Gender: F                       HR:           70 bpm. Exam Location:  Inpatient Procedure: 2D Echo, Cardiac Doppler and Color Doppler (Both Spectral and Color            Flow Doppler were utilized during procedure). Indications:    Chest Pain R07.9  History:        Patient has prior history of Echocardiogram examinations, most                 recent 11/30/2022. CHF, Angina and CAD, Pulmonary HTN and COPD,                 Arrythmias:Atrial Fibrillation and Tachycardia,                 Signs/Symptoms:Syncope and Chest Pain; Risk                 Factors:Hypertension, Sleep Apnea, Diabetes and Dyslipidemia.  Sonographer:    Thea Norlander RCS Referring Phys: BRADLY MARLA DRONES  Sonographer Comments: Image acquisition challenging due to patient body habitus. Patient declined Definity . IMPRESSIONS  1. Left ventricular ejection fraction, by estimation, is 70 to 75%. The left ventricle has hyperdynamic function. The left ventricle has no regional wall motion abnormalities. There is moderate left ventricular hypertrophy. Left ventricular diastolic parameters are indeterminate.  2. Right ventricular systolic  function is normal. The right ventricular size is mildly enlarged. There is normal pulmonary artery systolic pressure. The estimated right ventricular systolic pressure is 23.8 mmHg.  3. Left atrial size was moderately dilated.  4. Exuberant, likely caseating, mitral annular calcifications, seen on prior echos. The mitral valve is degenerative. Trivial mitral valve regurgitation. Severe mitral stenosis. The mean mitral valve gradient is 16.0 mmHg with average heart rate of 84 bpm. Severe mitral annular calcification.  5. The aortic valve is grossly normal. There is mild calcification of the aortic valve. Aortic valve regurgitation is not visualized. Aortic valve sclerosis is present, with no evidence of aortic valve stenosis. Aortic valve mean gradient measures 8.0 mmHg.  6. The inferior vena cava is normal in size with greater than 50% respiratory variability, suggesting right atrial pressure of 3 mmHg. FINDINGS  Left Ventricle: Left ventricular ejection fraction, by estimation, is 70 to 75%. The left ventricle has hyperdynamic function. The left ventricle has no regional wall motion abnormalities. The left ventricular internal cavity size was normal in size. There is moderate left ventricular hypertrophy. Left ventricular diastolic parameters are indeterminate. Right Ventricle: The right ventricular size is mildly enlarged. No increase in right ventricular wall thickness. Right ventricular systolic function is normal. There is normal pulmonary artery systolic pressure. The tricuspid regurgitant velocity is 2.28  m/s, and with an assumed right atrial pressure of 3 mmHg, the estimated right ventricular systolic pressure is  23.8 mmHg. Left Atrium: Left atrial size was moderately dilated. Right Atrium: Right atrial size was normal in size. Pericardium: There is no evidence of pericardial effusion. Mitral Valve: Exuberant, likely caseating, mitral annular calcifications, seen on prior echos. The mitral valve is  degenerative in appearance. Severe mitral annular calcification. Trivial mitral valve regurgitation. Severe mitral valve stenosis. MV peak gradient, 27.1 mmHg. The mean mitral valve gradient is 16.0 mmHg with average heart rate of 84 bpm. Tricuspid Valve: The tricuspid valve is normal in structure. Tricuspid valve regurgitation is trivial. No evidence of tricuspid stenosis. Aortic Valve: The aortic valve is grossly normal. There is mild calcification of the aortic valve. Aortic valve regurgitation is not visualized. Aortic valve sclerosis is present, with no evidence of aortic valve stenosis. Aortic valve mean gradient measures 8.0 mmHg. Aortic valve peak gradient measures 14.6 mmHg. Aortic valve area, by VTI measures 2.34 cm. Pulmonic Valve: The pulmonic valve was normal in structure. Pulmonic valve regurgitation is trivial. No evidence of pulmonic stenosis. Aorta: The aortic root is normal in size and structure. Venous: The inferior vena cava is normal in size with greater than 50% respiratory variability, suggesting right atrial pressure of 3 mmHg. IAS/Shunts: The atrial septum is grossly normal.  LEFT VENTRICLE PLAX 2D LVIDd:         3.80 cm   Diastology LVIDs:         2.30 cm   LV e' medial:    7.40 cm/s LV PW:         1.50 cm   LV E/e' medial:  21.2 LV IVS:        0.90 cm   LV e' lateral:   9.79 cm/s LVOT diam:     2.20 cm   LV E/e' lateral: 16.0 LV SV:         78 LV SV Index:   37 LVOT Area:     3.80 cm  RIGHT VENTRICLE             IVC RV S prime:     13.70 cm/s  IVC diam: 1.80 cm TAPSE (M-mode): 3.0 cm LEFT ATRIUM             Index        RIGHT ATRIUM           Index LA diam:        4.30 cm 2.01 cm/m   RA Area:     15.20 cm LA Vol (A2C):   58.8 ml 27.49 ml/m  RA Volume:   34.70 ml  16.22 ml/m LA Vol (A4C):   75.1 ml 35.11 ml/m LA Biplane Vol: 68.0 ml 31.80 ml/m  AORTIC VALVE AV Area (Vmax):    2.13 cm AV Area (Vmean):   2.19 cm AV Area (VTI):     2.34 cm AV Vmax:           191.00 cm/s AV Vmean:           126.000 cm/s AV VTI:            0.335 m AV Peak Grad:      14.6 mmHg AV Mean Grad:      8.0 mmHg LVOT Vmax:         107.00 cm/s LVOT Vmean:        72.600 cm/s LVOT VTI:          0.206 m LVOT/AV VTI ratio: 0.61  AORTA Ao Root diam: 3.20 cm Ao Asc diam:  3.00 cm  MITRAL VALVE                TRICUSPID VALVE MV Area (PHT): 2.22 cm     TR Peak grad:   20.8 mmHg MV Area VTI:   1.05 cm     TR Vmax:        228.00 cm/s MV Peak grad:  27.1 mmHg MV Mean grad:  16.0 mmHg    SHUNTS MV Vmax:       2.60 m/s     Systemic VTI:  0.21 m MV Vmean:      193.5 cm/s   Systemic Diam: 2.20 cm MV Decel Time: 342 msec MV E velocity: 157.00 cm/s MV A velocity: 185.00 cm/s MV E/A ratio:  0.85 Soyla Merck MD Electronically signed by Soyla Merck MD Signature Date/Time: 09/06/2024/1:12:10 PM    Final    CT Angio Chest/Abd/Pel for Dissection W and/or Wo Contrast Result Date: 09/05/2024 CLINICAL DATA:  Acute aortic syndrome, heart catheterization yesterday, chest pain, dizziness EXAM: CT ANGIOGRAPHY CHEST, ABDOMEN AND PELVIS TECHNIQUE: Non-contrast CT of the chest was initially obtained. Multidetector CT imaging through the chest, abdomen and pelvis was performed using the standard protocol during bolus administration of intravenous contrast. Multiplanar reconstructed images and MIPs were obtained and reviewed to evaluate the vascular anatomy. RADIATION DOSE REDUCTION: This exam was performed according to the departmental dose-optimization program which includes automated exposure control, adjustment of the mA and/or kV according to patient size and/or use of iterative reconstruction technique. CONTRAST:  OMNIPAQUE  IOHEXOL  350 MG/ML SOLN COMPARISON:  09/05/2024, 06/22/2024 FINDINGS: CTA CHEST FINDINGS Cardiovascular: No evidence of thoracic aortic aneurysm or dissection. Heart is unremarkable without pericardial effusion. Large 5.0 x 3.3 cm calcification extends along the mitral valve, similar to prior exam. Stable mild  calcification of the aortic valve. Atherosclerosis of the aorta and coronary vasculature. There is technically adequate opacification of the pulmonary vasculature. No filling defects or pulmonary emboli. Mediastinum/Nodes: No enlarged mediastinal, hilar, or axillary lymph nodes. Thyroid  gland, trachea, and esophagus demonstrate no significant findings. Lungs/Pleura: No acute airspace disease, effusion, or pneumothorax. Scattered areas of subsegmental atelectasis or scarring within the upper lobes. There is a 9 mm ground-glass nodule within the right lower lobe reference image 51/7, new since recent CT and therefore likely inflammatory or infectious. Central airways are patent. Musculoskeletal: No acute or destructive bony abnormalities. Reconstructed images demonstrate no additional findings. Review of the MIP images confirms the above findings. CTA ABDOMEN AND PELVIS FINDINGS VASCULAR Aorta: Normal caliber aorta without aneurysm, dissection, vasculitis or significant stenosis. Moderate atherosclerosis. Celiac: Patent without evidence of aneurysm, dissection, vasculitis or significant stenosis. SMA: Patent without evidence of aneurysm, dissection, vasculitis or significant stenosis. Renals: Both renal arteries are patent without evidence of aneurysm, dissection, vasculitis, fibromuscular dysplasia or significant stenosis. Mild atherosclerosis at the origin of the renal arteries, left greater than right. IMA: Patent without evidence of aneurysm, dissection, vasculitis or significant stenosis. Inflow: Patent without evidence of aneurysm, dissection, vasculitis or significant stenosis. Veins: No obvious venous abnormality within the limitations of this arterial phase study. Review of the MIP images confirms the above findings. NON-VASCULAR Hepatobiliary: No focal liver abnormality is seen. No gallstones, gallbladder wall thickening, or biliary dilatation. Pancreas: Unremarkable. No pancreatic ductal dilatation or  surrounding inflammatory changes. Spleen: Normal in size without focal abnormality. Adrenals/Urinary Tract: Adrenals are stable. Kidneys enhance normally and symmetrically. No urinary tract calculi or obstructive uropathy. Bladder is unremarkable. Stomach/Bowel: No bowel obstruction or ileus. Normal appendix right lower quadrant.  Colonic diverticulosis without evidence of acute diverticulitis. Lymphatic: No pathologic adenopathy. Reproductive: Status post hysterectomy. No adnexal masses. Other: No free fluid or free intraperitoneal gas. No abdominal wall hernia. Musculoskeletal: No acute or destructive bony abnormalities. Reconstructed images demonstrate no additional findings. Review of the MIP images confirms the above findings. IMPRESSION: Vascular: 1. No evidence of thoracoabdominal aortic aneurysm or dissection. 2. No evidence of pulmonary embolus. 3. Stable large amorphous calcification across the mitral valve. 4. Aortic Atherosclerosis (ICD10-I70.0). Coronary artery atherosclerosis. Nonvascular: 1. New 9 mm right lower lobe ground-glass nodule. Given the rapid development since recent CT 06/2024, this is likely inflammatory or infectious. Initial follow-up with CT at 6 months is recommended to document resolution. If persistent, repeat CT is recommended every 2 years until 5 years of stability has been established. This recommendation follows the consensus statement: Guidelines for Management of Incidental Pulmonary Nodules Detected on CT Images: From the Fleischner Society 2017; Radiology 2017; 284:228-243. 2. No acute intra-abdominal or intrapelvic process. 3. Colonic diverticulosis without diverticulitis. Electronically Signed   By: Ozell Daring M.D.   On: 09/05/2024 19:57   DG Chest 2 View Result Date: 09/05/2024 CLINICAL DATA:  Chest pain. EXAM: CHEST - 2 VIEW COMPARISON:  Chest radiograph dated 07/19/2023. FINDINGS: Mild cardiomegaly with mild vascular congestion. No focal consolidation, pleural  effusion pneumothorax or acute osseous pathology. IMPRESSION: Mild cardiomegaly with mild vascular congestion. Electronically Signed   By: Vanetta Chou M.D.   On: 09/05/2024 16:39   Scheduled Meds:  apixaban   5 mg Oral BID   atorvastatin   80 mg Oral QPM   cyanocobalamin   1,000 mcg Oral Daily   ferrous sulfate   325 mg Oral Q breakfast   folic acid   1 mg Oral Daily   hydroxychloroquine   200 mg Oral QHS   insulin  aspart  0-15 Units Subcutaneous TID WC   insulin  glargine-yfgn  20 Units Subcutaneous QHS   loratadine   10 mg Oral Daily   melatonin  10 mg Oral QHS   pantoprazole   40 mg Oral QAC breakfast   Continuous Infusions:  sodium chloride      azithromycin      [START ON 09/07/2024] cefTRIAXone  (ROCEPHIN )  IV     [START ON 09/07/2024] vancomycin       LOS: 0 days   Time spent: 50 minutes  Dr. Sherre [facility] If 7PM-7AM, please contact night-coverage 09/06/2024, 2:51 PM

## 2024-09-06 NOTE — Assessment & Plan Note (Signed)
 CPAP nightly ordered

## 2024-09-06 NOTE — Assessment & Plan Note (Signed)
 Not in exacerbation  DuoNebs every 6 hours.  For wheezing and shortness of breath.

## 2024-09-06 NOTE — H&P (Signed)
 History and Physical    Lynn [name] FMW:[id number] DOB: 1956-04-08 DOA: 09/05/2024  PCP: Lynn [name], MD Patient coming from: Home  Chief Complaint: chest pain  HPI: Lynn [name] is a 68 y.o. female with medical history significant of COPD, CAD diastolic heart failure, depression, hypertension, hyperlipidemia and microcytic anemia previous history of MI who presented to hospital with complaint of chest pain.  Patient recently underwent cardiac catheterization on 09/04/2024 and was found to have minimal RCA disease show over almost 40% approximately, 30% eccentric LAD D1 and 20% D2 otherwise no CAD.  She had a severe pulmonary hypertension; transpulmonary gradient near 20 with mean PAP 48 mmHg, PCWP 29 mmHg and a little LVEDP D20 1 mmHg.  Consistent with intrinsic pulmonary hypertension, LV diastolic dysfunction and mitral stenosis. Catheterization was performed through the right wrist. The patient states that she was doing well after the procedure. She went home and states that she started to experience some pain in the right arm. She states that was also associated with chest pain as well and she was having a sharp pain down her right leg. The patient told her husband and they decided to return to the hospital for further evaluation and treatment.   ED Course:  In the ER, BP 113/59, HR 78, RR 25, O2 saturation 100% on 3l [place], and Tmax 98.6. Cbc was unremarkable. Chemistry demonstrated Na 133, K 4.3, Cl 92, bicarb 30, Bun/Cr 23/1.8 and glucose 96. Anion gap. Initial troponin was 25 and repeat was 27.  CXR demonstrated no acute cardiopulmonary findings.  CT angio was performed which demonstrated no evidence of thoracic abdominal aortic aneurysm or dissection.  No evidence of pulmonary embolism.  Stable large amorphus calcification across the mitral valve.  Atherosclerosis of the aorta.  New 9 mm right lower lobe ground glass nodule.  Given rapid development since recent CTA  07/04/2024, likely inflammatory infectious.  In initial follow-up with CT in 6 months is recommended.   EKG demonstrated normal sinus rhythm with supraventricular bigeminy. Review of Systems:  All systems reviewed and apart from history of presenting illness, are negative.  Past Medical History:  Diagnosis Date   Allergy 2018   Anxiety    CHF (congestive heart failure) (HCC)    Clotting disorder    blood clot in eye 2016   COPD (chronic obstructive pulmonary disease) (HCC)    Coronary artery disease, non-occlusive    a. 11/2016 NSTEMI/Cath: LM nl, LAD 40p, 50md, D1/2 small, LCX 40ost, OM2/3 nl/small, RCA 35 ost/mid, RPDA/RPL small/nl.   Coronary vasospasm    Depression    Diastolic dysfunction    a. 11/2016 Echo: EF 55-60%, gr1 DD, sev calcified MV annulus, mildly dil LA.   Eye hemorrhage 01/2015   right; resolved (07/03/2017)   Gastric ulcer    GERD (gastroesophageal reflux disease)    Headache    monthly (07/03/2017)   Heart murmur    History of blood transfusion    when I had an ectopic pregnancy   History of stomach ulcers    Hyperlipidemia    Hypertension    Microcytic anemia    Moderate mitral stenosis by prior echocardiogram 03/2021   TTE: Mod MS (mean gradient 9 mmHg). -- TEE Moderate-Severe MS (mean gradient 14 mmHg) with fixed Post MV Leaflet & Severe MAC w/ large circumscribed calcified mass on the Post MV Leaflet. (Mass previously described in 2018) - CMRI identifies calcified mass & not Tumor.; R&LHC - LVEDP & PCWP both 23  mmHg indicating minimal resting gradient   Myocardial infarction (HCC) 2023   OSA on CPAP    setting is unknown   Oxygen  deficiency    PAF (paroxysmal atrial fibrillation) (HCC) 03/2021   Event Monitor Aug-Sept 2022: Predominantly sinus rhythm.  1% A. fib burden.  30 brief episodes of PAT.  2 short bursts of NSVT.   PAT (paroxysmal atrial tachycardia)    Event monitor from August 2022 showed 30 brief bursts longest 14 seconds.   Pneumonia     several times (07/03/2017)   PVC's (premature ventricular contractions)    Rheumatoid arthritis (HCC)    Sickle cell anemia (HCC)    Sleep apnea    Stroke [facility])    Syncope 07/03/2017 X 2   called seizures but no medications   Thalassemia    my cells are sickle cell shaped but I don't have sickle cell anemia (07/03/2017)   Type II diabetes mellitus (HCC)     Past Surgical History:  Procedure Laterality Date   48-Hour Monitor  03/18/2017   Sinus rhythm with sinus tachycardia (rate 58-134 BPM)multiple PVCs noted with couplets and bigeminy. One triplet. 7 runs of PAT ranging from 100-130 bpm. Longest was 33 beats.   BALLOON ENTEROSCOPY N/A 08/13/2023   Procedure: BALLOON ENTEROSCOPY;  Surgeon: San Sandor GAILS, DO;  Location: [facility];  Service: Gastroenterology;  Laterality: N/A;   BIOPSY  04/03/2021   Procedure: BIOPSY;  Surgeon: Leigh Elspeth SQUIBB, MD;  Location: [facility];  Service: Gastroenterology;;   BIOPSY  12/17/2022   Procedure: BIOPSY;  Surgeon: Abran Norleen SAILOR, MD;  Location: [facility];  Service: Gastroenterology;;   BREAST BIOPSY Left    BRONCHIAL WASHINGS  03/28/2021   Procedure: BRONCHIAL WASHINGS;  Surgeon: Gretta Leita SQUIBB, DO;  Location: [facility];  Service: Endoscopy;;   CARDIAC CATHETERIZATION N/A 11/19/2016   Procedure: Left Heart Cath and Coronary Angiography;  Surgeon: Victory LELON Sharps, MD;  Location: [facility] INVASIVE CV LAB;  Service: Cardiovascular.    LM nl, LAD 40p, 50md, D1/2 small, LCX 40ost, OM2/3 nl/small, RCA 35 ost/mid, RPDA/RPL small/nl.   CARDIAC MRI  03/30/2021   Normal LVEF ~53%. 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 No Scar or Late Gadolinium Enhancement on LV Myocardium.   CARPAL TUNNEL RELEASE Right 11/01/2014   COLONOSCOPY     DILATION AND CURETTAGE OF UTERUS     ECTOPIC PREGNANCY SURGERY  X 2   ENTEROSCOPY N/A 09/05/2022   Procedure: ENTEROSCOPY;  Surgeon: Wilhelmenia Aloha Raddle., MD;   Location: [facility];  Service: Gastroenterology;  Laterality: N/A;   ENTEROSCOPY N/A 07/17/2023   Procedure: ENTEROSCOPY;  Surgeon: Abran Norleen SAILOR, MD;  Location: [facility];  Service: Gastroenterology;  Laterality: N/A;   ESOPHAGOGASTRODUODENOSCOPY N/A 12/17/2022   Procedure: ESOPHAGOGASTRODUODENOSCOPY (EGD);  Surgeon: Abran Norleen SAILOR, MD;  Location: [facility];  Service: Gastroenterology;  Laterality: N/A;   ESOPHAGOGASTRODUODENOSCOPY (EGD) WITH PROPOFOL  N/A 04/03/2021   Procedure: ESOPHAGOGASTRODUODENOSCOPY (EGD) WITH PROPOFOL ;  Surgeon: Leigh Elspeth SQUIBB, MD;  Location: [facility];  Service: Gastroenterology;  Laterality: N/A;   ESOPHAGOGASTRODUODENOSCOPY (EGD) WITH PROPOFOL  N/A 08/15/2021   Procedure: ESOPHAGOGASTRODUODENOSCOPY (EGD) WITH PROPOFOL ;  Surgeon: Aneita Gwendlyn DASEN, MD;  Location: [facility];  Service: Endoscopy;  Laterality: N/A;   GIVENS CAPSULE STUDY N/A 07/17/2023   Procedure: GIVENS CAPSULE STUDY;  Surgeon: Abran Norleen SAILOR, MD;  Location: [facility];  Service: Gastroenterology;  Laterality: N/A;   HOT HEMOSTASIS N/A 09/05/2022   Procedure: HOT HEMOSTASIS (  ARGON PLASMA COAGULATION/BICAP);  Surgeon: Wilhelmenia Aloha Raddle., MD;  Location: [facility];  Service: Gastroenterology;  Laterality: N/A;   HOT HEMOSTASIS N/A 08/13/2023   Procedure: HOT HEMOSTASIS (ARGON PLASMA COAGULATION/BICAP);  Surgeon: San Sandor GAILS, DO;  Location: [facility];  Service: Gastroenterology;  Laterality: N/A;   JOINT REPLACEMENT     KNEE ARTHROSCOPY Left 11/2014   RIGHT/LEFT HEART CATH AND CORONARY ANGIOGRAPHY N/A 08/08/2021   Procedure: RIGHT/LEFT HEART CATH AND CORONARY ANGIOGRAPHY;  Surgeon: [name], MD;  Location: [facility] INVASIVE CV LAB;  Service: Cardiovascular;Ost LCx 40%. Ost-Prox RCA 35%. Prox-Mid LAD 25%-<25% D2. Mod-Severely Elevated LVEDP. Mild PA HTN -> PA mean 31 mmHg with a PCWP and LVEDP of 23 mmHg   RIGHT/LEFT HEART CATH AND CORONARY ANGIOGRAPHY N/A 09/04/2024    Procedure: RIGHT/LEFT HEART CATH AND CORONARY ANGIOGRAPHY;  Surgeon: [name], MD;  Location: [facility] INVASIVE CV LAB;  Service: Cardiovascular;  Laterality: N/A;   SUBMUCOSAL TATTOO INJECTION  09/05/2022   Procedure: SUBMUCOSAL TATTOO INJECTION;  Surgeon: Wilhelmenia Aloha Raddle., MD;  Location: [facility];  Service: Gastroenterology;;   SUBMUCOSAL TATTOO INJECTION  07/17/2023   Procedure: SUBMUCOSAL TATTOO INJECTION;  Surgeon: Abran Norleen SAILOR, MD;  Location: [facility];  Service: Gastroenterology;;   TEE WITHOUT CARDIOVERSION N/A 03/28/2021   Procedure: TRANSESOPHAGEAL ECHOCARDIOGRAM (TEE);  Surgeon: Alveta Aleene PARAS, MD;  Location: [facility] ENDOSCOPY;; LVEF 55-60%. Normal LV Fxn & no RWMA. No LAA thrombus. Gr II Ao Plaque. Large circumscribed calcific mass (1.8 x 1.4 cm) anterior to posterior annulus.  Fixed posterior leaflet with Mod-Severe MS (Mean MVG ~14 mmHg, Peak 20 mmhg).  Mild MR   TONSILLECTOMY     TOTAL KNEE ARTHROPLASTY Right 02/18/2015   Procedure: RIGHT TOTAL KNEE ARTHROPLASTY;  Surgeon: Marcey Her, MD;  Location[facility];  Service: Orthopedics;  Laterality: Right;   TOTAL KNEE ARTHROPLASTY Left 08/19/2015   Procedure: LEFT TOTAL KNEE ARTHROPLASTY;  Surgeon: Marcey Her, MD;  Location: [facility] OR;  Service: Orthopedics;  Laterality: Left;   TRANSTHORACIC ECHOCARDIOGRAM  11/2016; 07/2017:   a) normal LV size, thickness and function. EF 55-60%. GR 1 DD.No RWMA severe mitral annular calcification, but no mitral stenosis. Mild LA dilation;; b) normal LV size and function.  EF 65-75%.  Unable to assess diastolic function.  Aortic sclerosis with no stenosis.  Moderate mitral stenosis? (Gradient 9 mmHg) -?  Mild-mod left atrial enlargement    TRANSTHORACIC ECHOCARDIOGRAM  01/2019   EF 65 to 70%.  Normal function.  Elevated LVEDP-GR 1 DD.  Normal RV size and function.  Large calcific mass on posterior mitral leaflet mild to moderate mitral stenosis.   TRANSTHORACIC ECHOCARDIOGRAM  03/24/2021   EF  60 to 65%.  LV function with normal wall motion.  Moderate basal septal LVH.  GRII DD & Mild LA dilation.-> elevated LVEDP.  Normal RV function.  Mildly elevated PAP.  Ill-defined density measuring 3.23 x 2.1 cm region of the posterior MV leaflet along with dense MAC.  Moderate MS (mean MVG of 9 mmHg.) Normal AoV & RAP. --> Recommend TEE 2/2 concern for MV SBE.   TRANSTHORACIC ECHOCARDIOGRAM  04/26/2022   EF 60 to 65%.  No RWMA.  Moderate LVH with GR 1 DD.  Normal RV size and function.  Mild LA dilation.  Large calcific mass of posterior MV leaflet.  Mean PG 13 mm 3.  Trivial MR.  Mild to moderate MS.  Severe MAC.  Normal AOV.  Normal RAP.   VAGINAL HYSTERECTOMY  VIDEO BRONCHOSCOPY N/A 03/28/2021   Procedure: VIDEO BRONCHOSCOPY WITHOUT FLUORO;  Surgeon: Gretta Leita SQUIBB, DO;  Location: [facility] ENDOSCOPY;  Service: Endoscopy;  Laterality: N/A;     reports that she quit smoking about 9 years ago. Her smoking use included cigarettes. She started smoking about 53 years ago. She has a 11 pack-year smoking history. She has never used smokeless tobacco. She reports that she does not currently use alcohol. She reports that she does not use drugs.  Allergies  Allergen Reactions   Methotrexate Other (See Comments)    Pneumonitis   Penicillins Hives    Childhood allergy    Farxiga  [Dapagliflozin ] Other (See Comments)    Dizzy and lethargic    Metformin  And Related Diarrhea and Other (See Comments)    Also, bleeding    Family History  Problem Relation Age of Onset   Cancer Mother    Diabetes Mother    Hypertension Mother    Hyperlipidemia Mother    Cancer Father    Hyperlipidemia Father    Mental illness Sister    Diabetes Sister    Diabetes Maternal Grandmother    Diabetes Maternal Grandfather    Colon cancer Neg Hx    Esophageal cancer Neg Hx    Stomach cancer Neg Hx    Rectal cancer Neg Hx    Tremor Neg Hx    Parkinson's disease Neg Hx     Prior to Admission medications   Medication  Sig Start Date End Date Taking? Authorizing Provider  acetaminophen  (TYLENOL ) 500 MG tablet Take 1,000 mg by mouth daily at 12 noon.    [provider]  albuterol  (PROVENTIL ) (2.5 MG/3ML) 0.083% nebulizer solution INHALE 3 ML BY NEBULIZATION EVERY 6 HOURS AS NEEDED FOR WHEEZING OR SHORTNESS OF BREATH Patient taking differently: Take 2.5 mg by nebulization 2 (two) times daily. 03/30/24   Cobb, Comer GAILS, NP  albuterol  (VENTOLIN  HFA) 108 (90 Base) MCG/ACT inhaler TAKE 2 PUFFS BY MOUTH EVERY 6 HOURS AS NEEDED FOR WHEEZE OR SHORTNESS OF BREATH 07/27/24   Geronimo Amel, MD  ALPRAZolam  (XANAX ) 0.25 MG tablet Take 1 tablet (0.25 mg total) by mouth 2 (two) times daily as needed for anxiety. TAKE 1-2 TABLETS BY MOUTH 2 TIMES DAILY AS NEEDED FOR ANXIETY OR INSOMNIA 06/17/24   Copland, [name], MD  apixaban  (ELIQUIS ) 5 MG TABS tablet Take 1 tablet (5 mg total) by mouth 2 (two) times daily. 08/27/23   Meng, Hao, PA  atorvastatin  (LIPITOR ) 80 MG tablet Take 1 tablet (80 mg total) by mouth every evening. 08/27/23   Meng, Hao, PA  cetirizine (ZYRTEC) 10 MG tablet Take 10 mg by mouth daily as needed for allergies.    [provider]  Continuous Glucose Sensor (DEXCOM G7 SENSOR) MISC 1 Device by Does not apply route as directed. 07/22/24   [name], [name], MD  cyanocobalamin  (VITAMIN B12) 1000 MCG tablet Take 1 tablet (1,000 mcg total) by mouth daily. 08/18/24   [name], MD  diltiazem  (CARDIZEM  CD) 360 MG 24 hr capsule Take 1 capsule (360 mg total) by mouth daily. 09/01/24   [name], MD  diphenhydramine-acetaminophen  (TYLENOL  PM) 25-500 MG TABS tablet Take 2 tablets by mouth at bedtime.    [provider]  ferrous sulfate  325 (65 FE) MG EC tablet Take 1 tablet (325 mg total) by mouth daily with breakfast. 08/18/24 08/18/25  Patel, Pranav M, MD  folic acid  (FOLVITE ) 800 MCG tablet Take 800 mcg by mouth daily.  [provider]  furosemide  (LASIX ) 40 MG  tablet Take 80 mg by mouth in the morning and an additional 40 mg for an overnight weight gain of 3 pounds or 5 pounds in a week 09/04/24   [name], MD  glucose blood (CONTOUR NEXT TEST) test strip 3x daily 07/20/19   [name], [name], MD  hydroxychloroquine  (PLAQUENIL ) 200 MG tablet Take 200 mg by mouth 2 (two) times daily. 05/30/22   [provider]  Insulin  Disposable Pump (OMNIPOD 5 G7 PODS, GEN 5,) MISC 1 Device by Other route every other day. 07/22/24   [name], [name], MD  insulin  lispro (HUMALOG ) 100 UNIT/ML injection Max Daily 70 units per pump 07/22/24   [name], [name], MD  isosorbide  mononitrate (IMDUR ) 30 MG 24 hr tablet Take 1 tablet (30 mg total) by mouth daily. 08/27/23   Meng, Hao, PA  Lancets 30G MISC 1 Device by Does not apply route 3 (three) times daily. 07/16/19   Copland, [name], MD  losartan  (COZAAR ) 100 MG tablet Take 0.5 tablets (50 mg total) by mouth daily. 01/20/24   [name], MD  Melatonin 5 MG CHEW Chew 10 mg by mouth at bedtime.    [provider]  Multiple Vitamins-Minerals (HM MULTIVITAMIN ADULT GUMMY PO) Take 2 each by mouth daily.    [provider]  nitroGLYCERIN  (NITROSTAT ) 0.4 MG SL tablet Place 1 tablet (0.4 mg total) under the tongue every 5 (five) minutes as needed for chest pain. 08/27/24 11/25/24  Goodrich, Callie E, PA-C  oxymetazoline  (AFRIN) 0.05 % nasal spray Place 2 sprays into both nostrils every 4 (four) hours as needed (for nose bleed, apply spray and hold pressure.). 08/18/24 08/18/25  Patel, Pranav M, MD  Oxymetazoline  HCl (VICKS SINEX NA) Place 1 spray into both nostrils every 12 (twelve) hours as needed (for congestion- MAX OF THREE CONSECUTIVE DAYS).    [provider]  pantoprazole  (PROTONIX ) 40 MG tablet Take 1 tablet (40 mg total) by mouth daily before breakfast. Patient taking differently: Take 40 mg by mouth at bedtime. 03/03/24   Zehr, Jessica D, PA-C   spironolactone  (ALDACTONE ) 25 MG tablet Take 1 tablet (25 mg total) by mouth daily. 06/08/24 09/06/24  [name], MD  tirzepatide  (MOUNJARO ) 15 MG/0.5ML Pen Inject 15 mg into the skin once a week. Patient not taking: Reported on 08/28/2024 07/22/24   [name], [name], MD  traZODone  (DESYREL ) 50 MG tablet Take 0.5-1 tablets (25-50 mg total) by mouth at bedtime as needed for sleep. Needs appt Patient taking differently: Take 50-100 mg by mouth at bedtime as needed for sleep. Needs appt 06/17/24   Lynn [name], MD    Physical Exam: Vitals:   09/05/24 2108 09/05/24 2345 09/06/24 0015 09/06/24 0045  BP:  (!) 116/57 (!) 109/49 (!) 106/47  Pulse:  81 78 77  Resp:  16 20 (!) 22  Temp: 98.6 F (37 C)     TempSrc: Oral     SpO2:  100% 97% 98%  Weight:      Height:        Physical Exam Constitutional:      General: He is not in acute distress.    Appearance: Normal appearance.  HENT:     Head: Normocephalic and atraumatic.  Eyes:     Extraocular Movements: Extraocular movements intact.     Conjunctiva/sclera: Conjunctivae normal.     Pupils: Pupils are equal, round, and reactive to light.  Cardiovascular:  Rate and Rhythm: Normal rate and regular rhythm.     Pulses: Normal pulses.     Heart sounds: Normal heart sounds.  Pulmonary:     Effort: Pulmonary effort is normal. No respiratory distress.     Breath sounds: Normal breath sounds. No wheezing, rhonchi or rales.  Abdominal:     General: Abdomen is flat. Bowel sounds are normal. There is no distension.     Palpations: Abdomen is soft.     Tenderness: There is no abdominal tenderness.  Musculoskeletal:        General: No deformity. Normal range of motion.  Skin:    General: Skin is warm and dry.     Coloration: Skin is not jaundiced.  Neurological:     General: No focal deficit present.     Mental Status: He is alert and oriented to person, place, and time. Mental status is at baseline.   Labs on  Admission: I have personally reviewed following labs and imaging studies  CBC: Recent Labs  Lab 09/04/24 0941 09/04/24 0942 09/04/24 0947 09/04/24 0959 09/05/24 1558  WBC  --   --   --   --  9.9  HGB 10.5* 10.5* 10.5* 9.9* 8.9*  HCT 31.0* 31.0* 31.0* 29.0* 30.6*  MCV  --   --   --   --  66.5*  PLT  --   --   --   --  283   Basic Metabolic Panel: Recent Labs  Lab 09/04/24 0941 09/04/24 0942 09/04/24 0947 09/04/24 0959 09/05/24 1742  NA 135 128* 135 134* 133*  K 4.3 4.1 4.3 4.3 4.3  CL  --   --   --   --  92*  CO2  --   --   --   --  30  GLUCOSE  --   --   --   --  96  BUN  --   --   --   --  23  CREATININE  --   --   --   --  1.80*  CALCIUM   --   --   --   --  9.8   GFR: Estimated Creatinine Clearance: 35.5 mL/min (A) (by C-G formula based on SCr of 1.8 mg/dL (H)). Liver Function Tests: Recent Labs  Lab 09/05/24 1704  AST 21  ALT 14  ALKPHOS 78  BILITOT 0.4  PROT 8.6*  ALBUMIN 3.8   Recent Labs  Lab 09/05/24 1704  LIPASE 27   No results for input(s): AMMONIA in the last 168 hours. Coagulation Profile: No results for input(s): INR, PROTIME in the last 168 hours. Cardiac Enzymes: No results for input(s): CKTOTAL, CKMB, CKMBINDEX, TROPONINI in the last 168 hours. BNP (last 3 results) No results for input(s): PROBNP in the last 8760 hours. HbA1C: No results for input(s): HGBA1C in the last 72 hours. CBG: Recent Labs  Lab 09/04/24 0754 09/05/24 1743  GLUCAP 207* 108*   Lipid Profile: No results for input(s): CHOL, HDL, LDLCALC, TRIG, CHOLHDL, LDLDIRECT in the last 72 hours. Thyroid  Function Tests: No results for input(s): TSH, T4TOTAL, FREET4, T3FREE, THYROIDAB in the last 72 hours. Anemia Panel: No results for input(s): VITAMINB12, FOLATE, FERRITIN, TIBC, IRON, RETICCTPCT in the last 72 hours. Urine analysis:    Component Value Date/Time   COLORURINE YELLOW 09/05/2022 0237   APPEARANCEUR CLEAR  09/05/2022 0237   LABSPEC 1.013 09/05/2022 0237   PHURINE 5.0 09/05/2022 0237   GLUCOSEU NEGATIVE 09/05/2022 0237   HGBUR NEGATIVE  09/05/2022 0237   BILIRUBINUR NEGATIVE 09/05/2022 0237   KETONESUR NEGATIVE 09/05/2022 0237   PROTEINUR NEGATIVE 09/05/2022 0237   UROBILINOGEN 0.2 08/18/2008 1535   NITRITE NEGATIVE 09/05/2022 0237   LEUKOCYTESUR TRACE (A) 09/05/2022 0237    Radiological Exams on Admission: CT Angio Chest/Abd/Pel for Dissection W and/or Wo Contrast Result Date: 09/05/2024 CLINICAL DATA:  Acute aortic syndrome, heart catheterization yesterday, chest pain, dizziness EXAM: CT ANGIOGRAPHY CHEST, ABDOMEN AND PELVIS TECHNIQUE: Non-contrast CT of the chest was initially obtained. Multidetector CT imaging through the chest, abdomen and pelvis was performed using the standard protocol during bolus administration of intravenous contrast. Multiplanar reconstructed images and MIPs were obtained and reviewed to evaluate the vascular anatomy. RADIATION DOSE REDUCTION: This exam was performed according to the departmental dose-optimization program which includes automated exposure control, adjustment of the mA and/or kV according to patient size and/or use of iterative reconstruction technique. CONTRAST:  OMNIPAQUE  IOHEXOL  350 MG/ML SOLN COMPARISON:  09/05/2024, 06/22/2024 FINDINGS: CTA CHEST FINDINGS Cardiovascular: No evidence of thoracic aortic aneurysm or dissection. Heart is unremarkable without pericardial effusion. Large 5.0 x 3.3 cm calcification extends along the mitral valve, similar to prior exam. Stable mild calcification of the aortic valve. Atherosclerosis of the aorta and coronary vasculature. There is technically adequate opacification of the pulmonary vasculature. No filling defects or pulmonary emboli. Mediastinum/Nodes: No enlarged mediastinal, hilar, or axillary lymph nodes. Thyroid  gland, trachea, and esophagus demonstrate no significant findings. Lungs/Pleura: No acute  airspace disease, effusion, or pneumothorax. Scattered areas of subsegmental atelectasis or scarring within the upper lobes. There is a 9 mm ground-glass nodule within the right lower lobe reference image 51/7, new since recent CT and therefore likely inflammatory or infectious. Central airways are patent. Musculoskeletal: No acute or destructive bony abnormalities. Reconstructed images demonstrate no additional findings. Review of the MIP images confirms the above findings. CTA ABDOMEN AND PELVIS FINDINGS VASCULAR Aorta: Normal caliber aorta without aneurysm, dissection, vasculitis or significant stenosis. Moderate atherosclerosis. Celiac: Patent without evidence of aneurysm, dissection, vasculitis or significant stenosis. SMA: Patent without evidence of aneurysm, dissection, vasculitis or significant stenosis. Renals: Both renal arteries are patent without evidence of aneurysm, dissection, vasculitis, fibromuscular dysplasia or significant stenosis. Mild atherosclerosis at the origin of the renal arteries, left greater than right. IMA: Patent without evidence of aneurysm, dissection, vasculitis or significant stenosis. Inflow: Patent without evidence of aneurysm, dissection, vasculitis or significant stenosis. Veins: No obvious venous abnormality within the limitations of this arterial phase study. Review of the MIP images confirms the above findings. NON-VASCULAR Hepatobiliary: No focal liver abnormality is seen. No gallstones, gallbladder wall thickening, or biliary dilatation. Pancreas: Unremarkable. No pancreatic ductal dilatation or surrounding inflammatory changes. Spleen: Normal in size without focal abnormality. Adrenals/Urinary Tract: Adrenals are stable. Kidneys enhance normally and symmetrically. No urinary tract calculi or obstructive uropathy. Bladder is unremarkable. Stomach/Bowel: No bowel obstruction or ileus. Normal appendix right lower quadrant. Colonic diverticulosis without evidence of acute  diverticulitis. Lymphatic: No pathologic adenopathy. Reproductive: Status post hysterectomy. No adnexal masses. Other: No free fluid or free intraperitoneal gas. No abdominal wall hernia. Musculoskeletal: No acute or destructive bony abnormalities. Reconstructed images demonstrate no additional findings. Review of the MIP images confirms the above findings. IMPRESSION: Vascular: 1. No evidence of thoracoabdominal aortic aneurysm or dissection. 2. No evidence of pulmonary embolus. 3. Stable large amorphous calcification across the mitral valve. 4. Aortic Atherosclerosis (ICD10-I70.0). Coronary artery atherosclerosis. Nonvascular: 1. New 9 mm right lower lobe ground-glass nodule. Given the rapid development since recent  CT 06/2024, this is likely inflammatory or infectious. Initial follow-up with CT at 6 months is recommended to document resolution. If persistent, repeat CT is recommended every 2 years until 5 years of stability has been established. This recommendation follows the consensus statement: Guidelines for Management of Incidental Pulmonary Nodules Detected on CT Images: From the Fleischner Society 2017; Radiology 2017; 284:228-243. 2. No acute intra-abdominal or intrapelvic process. 3. Colonic diverticulosis without diverticulitis. Electronically Signed   By: Ozell Daring M.D.   On: 09/05/2024 19:57   DG Chest 2 View Result Date: 09/05/2024 CLINICAL DATA:  Chest pain. EXAM: CHEST - 2 VIEW COMPARISON:  Chest radiograph dated 07/19/2023. FINDINGS: Mild cardiomegaly with mild vascular congestion. No focal consolidation, pleural effusion pneumothorax or acute osseous pathology. IMPRESSION: Mild cardiomegaly with mild vascular congestion. Electronically Signed   By: Vanetta Chou M.D.   On: 09/05/2024 16:39   CARDIAC CATHETERIZATION Result Date: 09/04/2024 Images from the original result were not included.   Prox LAD to Mid LAD lesion is 25% stenosed with 25% stenosed side branch in 2nd Diag.  Mid  LAD to Dist LAD lesion is 30% stenosed.   Ost Cx lesion is 40% stenosed.   Ost RCA to Prox RCA lesion is 40% stenosed. Prox RCA to Mid RCA lesion is 20% stenosed.   Hemodynamic findings consistent with severe pulmonary hypertension.   There is mild aortic valve stenosis.   There is moderate mitral valve stenosis. Dominance: Right POST-OPERATIVE DIAGNOSIS:  Angiographically minimal CAD with calcified minimal RCA disease over most 40% proximally, 30% eccentric LAD D1 and 20% D2 otherwise no CAD Severe Mixed Pulmonary Hypertension: Transpulmonary gradient near 20 with mean PAP 48 mmHg, PCWP 29 Miller mercury and LVEDP of 21 mmHg. => This is consistent with intrinsic pulmonary hypertension, LV diastolic dysfunction and mitral stenosis. RAP mean 19 mm; RVP-EDP 70/11-19 mmHg; PAP-mean 69/30-48 mm Q; PCWP 29-30 mmHg. PVR 2.88. LVP-EDP 133/10 -21 mmHg; AOP-MAP 130/64-92 mmHg. Ao sat 91%, PA sat 55%.  Normal Fick Cardiac Output-Index: 6.6-3.09 PLAN   In the absence of any other complications or medical issues, we expect the patient to be ready for discharge from a cath perspective on 09/04/2024.   Plan is to titrate up her diuretic-increased from 40 mg in the morning to 80 mg.  (80 mg IV given in the PACU holding area); Continue to titrate GDMT for afterload reduction/treatment of diastolic heart failure. Continue to counsel the patient about decreasing salt intake. Continue home oxygen    Recommend Aspirin  81mg  daily for moderate CAD. Alm Clay, MD   EKG: Independently reviewed.   Assessment/Plan Active Problems:   Chest pain  Chest pain Patient was placed in observation status Cardiac cath was negative for any type of lesions to suggest CAD Patient had CT that was negative for pulmonary embolism and any other etiology. Suggestion of possible right lower lobe pneumonia Patient was put on IV antibiotics Will obtain procalcitonin level and blood culture ordered Had no elevated white blood cell count or left  shift, stated that she had some chills and subjective fever at home If negative antibiotics can be discontinued patient can be discharged  Right lower extremity leg pain Likely secondary to her chronic sciatica Patient is on Neurontin  we will continue  DM type2 Will continue lantus  20 units daily  Sliding scale insulin    Paroxysmal atrial fibrillation Continue eliquis  5 mg bID Continue cardizem  360 mg daily  Rheumatoid arthritis Patient was started on hydroxychloroquin 200 mg BID  Hypertension Continue  imdur   Losartan  50 mg daily Prn lasix  40 mg   OSA on CPAP Will hold off overnight  Pulmonary hypertension/IPF Appears to be the underlying cause of difficulty breathing  DVT prophylaxis: lovenox  Code Status:  full Family Communication:   Farkas,Lester (Spouse) [phone] (Mobile)     Disposition Plan: Status is: Inpatient Not inpatient appropriate, will call UM team and downgrade to OBS.    Consults called: none Admission status: observation Level of care: Level of care: Telemetry The medical decision making on this patient was of high complexity and the patient is at high risk for clinical deterioration, therefore this is a level 3 visit.  The medical decision making is of moderate complexity, therefore this is a level 2 visit.  Bradly MARLA Drones MD [facility]  If 7PM-7AM, please contact night-coverage [email or web address]  09/06/2024, 1:07 AM

## 2024-09-06 NOTE — Progress Notes (Signed)
 Echocardiogram 2D Echocardiogram has been performed.  Thea Norlander 09/06/2024, 12:48 PM

## 2024-09-06 NOTE — Assessment & Plan Note (Signed)
 Hydralazine 5 mg IV every 6 hours as needed for SBP greater 165, 5 days ordered

## 2024-09-06 NOTE — Assessment & Plan Note (Signed)
 -  This complicates overall care and prognosis.

## 2024-09-06 NOTE — Assessment & Plan Note (Signed)
 Not in acute exacerbation

## 2024-09-06 NOTE — Hospital Course (Addendum)
 Ms. Hlee Fringer is a 68 year old female with history of NIDDM2, paroxysmal atrial fibrillation, rheumatoid arthritis, OSA on CPAP, pulmonary hypertension, hyperlipidemia, hypertension, GERD, morbid obesity, who presents to ED for chief concerns of chest pain.   Patient presented to the ED on 09/05/2024.  09/05/2024: Patient admitted [facility] for chest pain suspect secondary to right lower lobe pneumonia. 11/22: Assumed care.  11/23: Added azithromycin  500 mg IV, discontinue cefepime .  Ordered ceftriaxone  2 g IV daily.

## 2024-09-06 NOTE — Assessment & Plan Note (Signed)
 Azithromycin  500 mg IV daily, ceftriaxone  2 g IV daily to complete a 5-day course Discontinue cefepime  Check MRSA PCR, message nursing.  If negative vancomycin  will be discontinued 11/24: MRSA PCR is in process and has not resulted.  Message nursing regarding follow-up of this.  Once MRSA PCR results is negative, vancomycin  can be discontinued.

## 2024-09-06 NOTE — Assessment & Plan Note (Signed)
 Generalized body aches, secondary to pneumonia

## 2024-09-06 NOTE — Assessment & Plan Note (Signed)
 Home atorvastatin  80 mg every evening resumed

## 2024-09-06 NOTE — Progress Notes (Signed)
 Pharmacy Antibiotic Note  Lynn [name] is a 68 y.o. female admitted on 09/05/2024 with chest pain.  Pharmacy has been consulted for vancomycin  dosing for pneumonia. Underwent LHC/RHC on 11/21 and found to have minimal CAD and discharged.  -WBC 11, sCr 1.67, afebrile -CTA: a 9 mm ground-glass nodule within the RLL   Plan: -Cefepime  2g IV every 12 hours -Vancomycin  2g IV x1 -Vancomycin  750mg  IV every 24 hours (AUC 503, Vd 0.5, IBW, sCr 1.67) -Order MRSA PCR -Monitor renal function -Follow up signs of clinical improvement, LOT, de-escalation of antibiotics   Height: 5' 3 (160 cm) Weight: 115 kg (253 lb 8.5 oz) IBW/kg (Calculated) : 52.4  Temp (24hrs), Avg:98.4 F (36.9 C), Min:98.1 F (36.7 C), Max:98.6 F (37 C)  Recent Labs  Lab 09/05/24 1558 09/05/24 1742 09/06/24 0102  WBC 9.9  --  11.0*  CREATININE  --  1.80* 1.67*    Estimated Creatinine Clearance: 38.3 mL/min (A) (by C-G formula based on SCr of 1.67 mg/dL (H)).    Antimicrobials this admission: Cefepime  11/23 >>  Vancomycin  11/23 >>   Microbiology results: 11/23 MRSA PCR: ordered 11/23 BCx: ordered  Lynwood Poplar, PharmD, BCPS Clinical Pharmacist 09/06/2024 1:50 AM

## 2024-09-06 NOTE — Assessment & Plan Note (Signed)
 Home long-acting insulin  equivalent 20 units nightly resumed Insulin  SSI Goal inpatient blood glucose levels 140-180

## 2024-09-06 NOTE — ED Notes (Signed)
 Phlebotomy at bedside.

## 2024-09-07 DIAGNOSIS — J189 Pneumonia, unspecified organism: Secondary | ICD-10-CM | POA: Diagnosis not present

## 2024-09-07 DIAGNOSIS — G8929 Other chronic pain: Secondary | ICD-10-CM | POA: Insufficient documentation

## 2024-09-07 LAB — BASIC METABOLIC PANEL WITH GFR
Anion gap: 10 (ref 5–15)
BUN: 22 mg/dL (ref 8–23)
CO2: 27 mmol/L (ref 22–32)
Calcium: 9 mg/dL (ref 8.9–10.3)
Chloride: 99 mmol/L (ref 98–111)
Creatinine, Ser: 1.34 mg/dL — ABNORMAL HIGH (ref 0.44–1.00)
GFR, Estimated: 43 mL/min — ABNORMAL LOW (ref >60)
Glucose, Bld: 143 mg/dL — ABNORMAL HIGH (ref 70–99)
Potassium: 4.4 mmol/L (ref 3.5–5.1)
Sodium: 136 mmol/L (ref 135–145)

## 2024-09-07 LAB — GLUCOSE, CAPILLARY
Glucose-Capillary: 178 mg/dL — ABNORMAL HIGH (ref 70–99)
Glucose-Capillary: 216 mg/dL — ABNORMAL HIGH (ref 70–99)
Glucose-Capillary: 155 mg/dL — ABNORMAL HIGH (ref 70–99)
Glucose-Capillary: 218 mg/dL — ABNORMAL HIGH (ref 70–99)

## 2024-09-07 LAB — CBC
HCT: 27.7 % — ABNORMAL LOW (ref 36.0–46.0)
Hemoglobin: 8 g/dL — ABNORMAL LOW (ref 12.0–15.0)
MCH: 19.4 pg — ABNORMAL LOW (ref 26.0–34.0)
MCHC: 28.9 g/dL — ABNORMAL LOW (ref 30.0–36.0)
MCV: 67.1 fL — ABNORMAL LOW (ref 80.0–100.0)
Platelets: 240 K/uL (ref 150–400)
RBC: 4.13 MIL/uL (ref 3.87–5.11)
RDW: 19.9 % — ABNORMAL HIGH (ref 11.5–15.5)
WBC: 8.6 K/uL (ref 4.0–10.5)
nRBC: 0 % (ref 0.0–0.2)

## 2024-09-07 LAB — MRSA NEXT GEN BY PCR, NASAL: MRSA by PCR Next Gen: NOT DETECTED

## 2024-09-07 MED ORDER — IPRATROPIUM-ALBUTEROL 0.5-2.5 (3) MG/3ML IN SOLN
3.0000 mL | Freq: Two times a day (BID) | RESPIRATORY_TRACT | Status: DC
  Administered 2024-09-07 – 2024-09-08 (×2): 3 mL via RESPIRATORY_TRACT
  Filled 2024-09-07 (×2): qty 3

## 2024-09-07 MED ORDER — ALBUTEROL SULFATE (2.5 MG/3ML) 0.083% IN NEBU: 2.5000 mg | INHALATION_SOLUTION | Freq: Four times a day (QID) | RESPIRATORY_TRACT | Status: DC | PRN

## 2024-09-07 MED ORDER — IPRATROPIUM-ALBUTEROL 0.5-2.5 (3) MG/3ML IN SOLN
3.0000 mL | Freq: Four times a day (QID) | RESPIRATORY_TRACT | Status: DC
  Administered 2024-09-07: 3 mL via RESPIRATORY_TRACT
  Filled 2024-09-07: qty 3

## 2024-09-07 MED ORDER — GABAPENTIN 100 MG PO CAPS
200.0000 mg | ORAL_CAPSULE | Freq: Every day | ORAL | Status: DC
  Administered 2024-09-07: 200 mg via ORAL
  Filled 2024-09-07: qty 2

## 2024-09-07 MED ORDER — MENTHOL 3 MG MT LOZG
1.0000 | LOZENGE | OROMUCOSAL | Status: DC | PRN
Start: 2024-09-07 — End: 2024-09-08
  Filled 2024-09-07: qty 9

## 2024-09-07 NOTE — Evaluation (Signed)
 Occupational Therapy Evaluation Patient Details Name: Lynn [name] MRN: [id number] DOB: 12-14-1955 Today's Date: 09/07/2024   History of Present Illness   68 y/o F presenting to ED on 11/22 with chest pain, admitted for CAP.    PMH includes COPD, CAD, diastolic heart failure, depression, HTN, HLD, microcytic anemia/history of MI     Clinical Impressions Pt reports living with spouse, has PRN assist for ADLs when her sciatica acts up and ambulatory short household distances without AD. Pt currently needs up to mod A for ADLs, min A for bed mobility and CGA for transfers with RW. VSS on 4L O2 throughout session. Pt presenting with impairments listed below, will follow acutely. Recommend HHOT at d/c.     If plan is discharge home, recommend the following:   A little help with walking and/or transfers;A lot of help with bathing/dressing/bathroom;Assistance with cooking/housework;Help with stairs or ramp for entrance;Assist for transportation     Functional Status Assessment   Patient has had a recent decline in their functional status and demonstrates the ability to make significant improvements in function in a reasonable and predictable amount of time.     Equipment Recommendations   None recommended by OT     Recommendations for Other Services   PT consult     Precautions/Restrictions   Precautions Precautions: Fall Precaution/Restrictions Comments: 3L O2 baseline Restrictions Weight Bearing Restrictions Per Provider Order: No     Mobility Bed Mobility Overal bed mobility: Needs Assistance Bed Mobility: Sidelying to Sit   Sidelying to sit: Min assist            Transfers Overall transfer level: Needs assistance Equipment used: Rolling walker (2 wheels) Transfers: Sit to/from Stand Sit to Stand: Contact guard assist                  Balance Overall balance assessment: Needs assistance Sitting-balance support: Feet  supported Sitting balance-Leahy Scale: Good     Standing balance support: During functional activity, Reliant on assistive device for balance Standing balance-Leahy Scale: Poor Standing balance comment: reliant on external support                           ADL either performed or assessed with clinical judgement   ADL Overall ADL's : Needs assistance/impaired Eating/Feeding: Set up   Grooming: Contact guard assist   Upper Body Bathing: Minimal assistance   Lower Body Bathing: Moderate assistance   Upper Body Dressing : Minimal assistance   Lower Body Dressing: Moderate assistance   Toilet Transfer: Contact guard assist;Ambulation;Rolling walker (2 wheels)   Toileting- Clothing Manipulation and Hygiene: Contact guard assist       Functional mobility during ADLs: Contact guard assist;Rolling walker (2 wheels)       Vision Baseline Vision/History: 0 No visual deficits Vision Assessment?: No apparent visual deficits     Perception Perception: Not tested       Praxis Praxis: Not tested       Pertinent Vitals/Pain Pain Assessment Pain Assessment: No/denies pain     Extremity/Trunk Assessment Upper Extremity Assessment Upper Extremity Assessment: Generalized weakness   Lower Extremity Assessment Lower Extremity Assessment: Defer to PT evaluation   Cervical / Trunk Assessment Cervical / Trunk Assessment: Normal   Communication Communication Communication: No apparent difficulties   Cognition Arousal: Alert Behavior During Therapy: WFL for tasks assessed/performed Cognition: No apparent impairments  Following commands: Intact       Cueing  General Comments   Cueing Techniques: Verbal cues  VSS on 4L O2   Exercises     Shoulder Instructions      Home Living Family/patient expects to be discharged to:: Private residence Living Arrangements: Spouse/significant other Available Help at  Discharge: Family;Available 24 hours/day Type of Home: House Home Access: Stairs to enter [employer] of Steps: 4 Entrance Stairs-Rails: Can reach both Home Layout: Two level Alternate Level Stairs-Number of Steps: chair lift to 2nd floor Alternate Level Stairs-Rails: Can reach both Bathroom Shower/Tub: Producer, Television/film/video: Standard     Home Equipment: [employer]ant (2 wheels);Rollator (4 wheels);Shower seat;Electric scooter;Wheelchair - power;Grab bars - tub/shower          Prior Functioning/Environment Prior Level of Function : Needs assist             Mobility Comments: mostly ambulating without AD, occasionally using RW at home, uses chair lift to go upstairs/downstairs, limited household ambulator ADLs Comments: spouse assists with IADLs, sometimes has PRN assist for ADLs from spouse when her sciatica is very bad, wears briefs at home for toileting    OT Problem List: Decreased strength;Decreased range of motion;Decreased activity tolerance;Impaired balance (sitting and/or standing);Decreased cognition;Decreased safety awareness;Cardiopulmonary status limiting activity   OT Treatment/Interventions: Self-care/ADL training;Therapeutic exercise;Energy conservation;DME and/or AE instruction;Therapeutic activities;Patient/family education;Balance training      OT Goals(Current goals can be found in the care plan section)   Acute Rehab OT Goals Patient Stated Goal: none stated OT Goal Formulation: With patient Time For Goal Achievement: 09/21/24 Potential to Achieve Goals: Good ADL Goals Pt Will Perform Upper Body Dressing: with modified independence;sitting Pt Will Perform Lower Body Dressing: with modified independence;sitting/lateral leans;sit to/from stand Pt Will Transfer to Toilet: with modified independence;ambulating;regular height toilet Pt Will Perform Tub/Shower Transfer: Tub transfer;Shower transfer;with modified  independence;ambulating   OT Frequency:  Min 2X/week    Co-evaluation              AM-PAC OT 6 Clicks Daily Activity     Outcome Measure Help from another person eating meals?: None Help from another person taking care of personal grooming?: A Little Help from another person toileting, which includes using toliet, bedpan, or urinal?: A Little Help from another person bathing (including washing, rinsing, drying)?: A Lot Help from another person to put on and taking off regular upper body clothing?: A Little Help from another person to put on and taking off regular lower body clothing?: A Lot 6 Click Score: 17   End of Session Equipment Utilized During Treatment: Gait belt;Rolling walker (2 wheels);Oxygen  (4L) Nurse Communication: Mobility status  Activity Tolerance: Patient tolerated treatment well Patient left: in chair;with call bell/phone within reach;with chair alarm set  OT Visit Diagnosis: Unsteadiness on feet (R26.81);Other abnormalities of gait and mobility (R26.89);Muscle weakness (generalized) (M62.81)                Time: 9246-9177 OT Time Calculation (min): 29 min Charges:  OT General Charges $OT Visit: 1 Visit OT Evaluation $OT Eval Moderate Complexity: 1 Mod OT Treatments $Self Care/Home Management : 8-22 mins  Cleo Santucci K, OTD, OTR/L SecureChat Preferred Acute Rehab [phone]   Laneta POUR Koonce 09/07/2024, 9:33 AM

## 2024-09-07 NOTE — Evaluation (Signed)
 Physical Therapy Evaluation Patient Details Name: Lynn [name] MRN: [id number] DOB: [birth year] Today's Date: 09/07/2024  History of Present Illness  68 y/o F presenting to ED on 11/22 with chest pain, admitted for CAP.    PMH includes COPD, CAD, diastolic heart failure, depression, HTN, HLD, microcytic anemia/history of MI  Clinical Impression  Pt in bed upon arrival of PT, agreeable to evaluation at this time. Prior to admission the pt was mobilizing household distances with use of cane, RW, or no DME depending on pain levels and fatigue. She lives in a 2 story house with a stair lift to access her bedroom/bathroom. The pt was able to complete transfers and gait without need for physical assistance and good stability this morning. She was able to complete shorter bout of ambulation without DME but demos slowed speed and increased sway without UE support. Recommend continued skilled PT as well as follow up HHPT to further progress strength, endurance, dynamic stability, and RLE sciatica pain after d/c. Pt has all needed DME and assist from her spouse to return home once medically cleared.         If plan is discharge home, recommend the following: A little help with walking and/or transfers;Help with stairs or ramp for entrance   Can travel by private vehicle        Equipment Recommendations None recommended by PT  Recommendations for Other Services       Functional Status Assessment Patient has had a recent decline in their functional status and demonstrates the ability to make significant improvements in function in a reasonable and predictable amount of time.     Precautions / Restrictions Precautions Precautions: Fall Recall of Precautions/Restrictions: Intact Precaution/Restrictions Comments: 3L O2 baseline Restrictions Weight Bearing Restrictions Per Provider Order: No      Mobility  Bed Mobility               General bed mobility comments: sitting OOB in  recliner at start and end of session    Transfers Overall transfer level: Needs assistance Equipment used: Rolling walker (2 wheels), None Transfers: Sit to/from Stand Sit to Stand: Contact guard assist           General transfer comment: CGA for initial stand, slow to rise but stable. able to complete without RW later in session with good stability    Ambulation/Gait Ambulation/Gait assistance: Supervision Gait Distance (Feet): 100 Feet Assistive device: Rolling walker (2 wheels), None Gait Pattern/deviations: Step-through pattern, Decreased stride length, Wide base of support Gait velocity: decreased Gait velocity interpretation: <1.31 ft/sec, indicative of household ambulator   General Gait Details: The pt had slowed speed, but stable with RW, SpO2 >93% on 3L. pt completed 15 ft in room without DME, increased sway but no LOB. SpO2 after mobility to brief low of 88%, improved to 91% within 30 sec of seated rest on 3L     Balance Overall balance assessment: Needs assistance Sitting-balance support: Feet supported Sitting balance-Leahy Scale: Good     Standing balance support: During functional activity, Reliant on assistive device for balance Standing balance-Leahy Scale: Poor Standing balance comment: reliant on external support                             Pertinent Vitals/Pain Pain Assessment Pain Assessment: Faces Faces Pain Scale: Hurts little more Pain Location: RLE chronic sciatica Pain Descriptors / Indicators: Sharp Pain Intervention(s): Monitored during session, Limited activity within patient's tolerance,  Repositioned    Home Living Family/patient expects to be discharged to:: Private residence Living Arrangements: Spouse/significant other Available Help at Discharge: Family;Available 24 hours/day Type of Home: House Home Access: Stairs to enter Entrance Stairs-Rails: Can reach both Entrance Stairs-Number of Steps: 4 Alternate Level  Stairs-Number of Steps: chair lift to 2nd floor Home Layout: Two level Home Equipment: Rolling Walker (2 wheels);Rollator (4 wheels);Shower seat;Electric scooter;Wheelchair - power;Grab bars - tub/shower Additional Comments: pt reports 2 sets of all DME one for upstairs and one for downstairs    Prior Function Prior Level of Function : Needs assist             Mobility Comments: mostly ambulating without AD, occasionally using RW at home, uses chair lift to go upstairs/downstairs, limited household ambulator, no falls ADLs Comments: spouse assists with IADLs, sometimes has PRN assist for ADLs from spouse when her sciatica is very bad, wears briefs at home for toileting     Extremity/Trunk Assessment   Upper Extremity Assessment Upper Extremity Assessment: Defer to OT evaluation    Lower Extremity Assessment Lower Extremity Assessment: Overall WFL for tasks assessed;RLE deficits/detail RLE Deficits / Details: grossly 4+/5 other than R hip flexion due to pain from sciatica, 4-/4 at hip. RLE: Unable to fully assess due to pain RLE Coordination: WNL    Cervical / Trunk Assessment Cervical / Trunk Assessment: Normal  Communication   Communication Communication: No apparent difficulties    Cognition Arousal: Alert Behavior During Therapy: WFL for tasks assessed/performed   PT - Cognitive impairments: No apparent impairments                       PT - Cognition Comments: WFL in session, not formally assessed Following commands: Intact       Cueing Cueing Techniques: Verbal cues     General Comments General comments (skin integrity, edema, etc.): VSS on 3L    Exercises Other Exercises Other Exercises: discussed progressive walking program and repeated sit-stands   Assessment/Plan    PT Assessment Patient needs continued PT services  PT Problem List Decreased strength;Decreased range of motion;Decreased activity tolerance;Decreased balance;Decreased  mobility;Pain;Obesity       PT Treatment Interventions DME instruction;Gait training;Stair training;Therapeutic activities;Functional mobility training;Therapeutic exercise;Balance training;Patient/family education    PT Goals (Current goals can be found in the Care Plan section)  Acute Rehab PT Goals Patient Stated Goal: to improve RLE pain PT Goal Formulation: With patient Time For Goal Achievement: 09/21/24 Potential to Achieve Goals: Good    Frequency Min 2X/week        AM-PAC PT 6 Clicks Mobility  Outcome Measure Help needed turning from your back to your side while in a flat bed without using bedrails?: None Help needed moving from lying on your back to sitting on the side of a flat bed without using bedrails?: None Help needed moving to and from a bed to a chair (including a wheelchair)?: A Little Help needed standing up from a chair using your arms (e.g., wheelchair or bedside chair)?: A Little Help needed to walk in hospital room?: A Little Help needed climbing 3-5 steps with a railing? : A Little 6 Click Score: 20    End of Session Equipment Utilized During Treatment: Gait belt;Oxygen  Activity Tolerance: Patient tolerated treatment well;Patient limited by fatigue Patient left: in chair;with call bell/phone within reach;with chair alarm set Nurse Communication: Mobility status PT Visit Diagnosis: Unsteadiness on feet (R26.81);Other abnormalities of gait and mobility (R26.89);Muscle weakness (generalized) (  M62.81);Pain Pain - Right/Left: Right Pain - part of body: Leg    Time: 9156-9088 PT Time Calculation (min) (ACUTE ONLY): 28 min   Charges:   PT Evaluation $PT Eval Low Complexity: 1 Low PT Treatments $Therapeutic Exercise: 8-22 mins PT General Charges $$ ACUTE PT VISIT: 1 Visit         [name] Call, PT, DPT   Acute Rehabilitation Department Office [phone] Secure Chat Communication Preferred  [name] JULIANNA Call 09/07/2024, 10:10 AM

## 2024-09-07 NOTE — Assessment & Plan Note (Signed)
 Neck sciatica Gabapentin  200 mg nightly initiated as a trial

## 2024-09-07 NOTE — Progress Notes (Addendum)
 PROGRESS NOTE  Lynn [name]  FMW:[id number] DOB: 1955-11-06 DOA: 09/05/2024 PCP: Watt [name], MD   Lynn [name] is a 68 year old female with history of NIDDM2, paroxysmal atrial fibrillation, rheumatoid arthritis, OSA on CPAP, pulmonary hypertension, hyperlipidemia, hypertension, GERD, morbid obesity, interstitial lung disease on chronic 3 L nasal cannula, who presents to ED for chief concerns of chest pain.   Patient presented to the ED on 09/05/2024.  09/05/2024: Patient admitted to [facility] for chest pain suspect secondary to right lower lobe pneumonia. 11/22: Assumed care. 11/23: Added azithromycin  500 mg IV, discontinue cefepime .  Ordered ceftriaxone  2 g IV daily.  11/24: mrsa pcr has not resulted at this time. Day 2 of abx.   Assessment & Plan:   Principal Problem:   CAP (community acquired pneumonia) Active Problems:   ILD (interstitial lung disease) (HCC)   Coronary artery disease, non-occlusive   Chronic respiratory failure with hypoxia (HCC)   COPD (chronic obstructive pulmonary disease) (HCC)   Essential hypertension   Type 2 diabetes mellitus with hyperlipidemia (HCC)   PAF (paroxysmal atrial fibrillation) (HCC); CHA2DS2-VASc score 7   Dyslipidemia   Obesity, Class III, BMI 40-49.9 (morbid obesity) (HCC)   Rheumatoid arthritis (HCC)   PAT (paroxysmal atrial tachycardia)   Anxiety   OSA on CPAP   Shortness of breath   IDA (iron deficiency anemia)   Chest pain   Elevated troponin   Chronic back pain   Assessment and Plan:  * CAP (community acquired pneumonia) Azithromycin  500 mg IV daily, ceftriaxone  2 g IV daily to complete a 5-day course Discontinue cefepime  Check MRSA PCR, message nursing.  If negative vancomycin  will be discontinued 11/24: MRSA PCR is in process and has not resulted.  Message nursing regarding follow-up of this.  Once MRSA PCR results is negative, vancomycin  can be discontinued.  ILD (interstitial  lung disease) (HCC) Not in exacerbation  DuoNebs every 6 hours.  For wheezing and shortness of breath.  COPD (chronic obstructive pulmonary disease) (HCC) Not in acute exacerbation  Obesity, Class III, BMI 40-49.9 (morbid obesity) (HCC) This complicates overall care and prognosis.   Dyslipidemia Home atorvastatin  80 mg every evening resumed  Type 2 diabetes mellitus with hyperlipidemia (HCC) Home long-acting insulin  equivalent 20 units nightly resumed Insulin  SSI Goal inpatient blood glucose levels 140-180  Essential hypertension Hydralazine  5 mg IV every 6 hours as needed for SBP greater 165, 5 days ordered  Rheumatoid arthritis (HCC) Home hydroxychloroquine  200 mg nightly resumed  Chronic back pain Neck sciatica Gabapentin  200 mg nightly initiated as a trial  Elevated troponin Suspect demand ischemia, low clinical suspicion for ACS  Chest pain Generalized body aches, secondary to pneumonia  Shortness of breath With chest pain Suspect secondary to right lower lobe pneumonia, treat per above  OSA on CPAP CPAP nightly ordered  Anxiety Home alprazolam  0.25 mg p.o. twice daily as needed for anxiety resumed Trazodone  50-100 mg nightly as needed for sleep  DVT prophylaxis: Apixaban  5 mg p.o. twice daily Code Status: Full code Family Communication: No Disposition Plan: No. A phone call was offered, patient declined stating that when she gets to her room, she will let her family know herself.  Level of care: Telemetry  Consultants:  Pharmacy  Procedures:  None at this time  Antimicrobials: 11/22: Vancomycin  and cefepime  11/23: Cefepime  discontinued, initiated azithromycin  500 mg IV daily and ceftriaxone  2 g IV daily to complete a 7-day course.   Subjective:  At bedside, patient able to tell  me her first and last name, age, location, current calendar year. Patient reports she is feeling a little bit better today however is not back to her baseline.  Reports her  oxygen  is now back to baseline now at 3 L chronically.  She endorses persistent sciatica pain.  She reports at home she just takes Tylenol  and would like to try something else.  Objective: Vitals:   09/07/24 0348 09/07/24 0733 09/07/24 1114 09/07/24 1529  BP: 135/85 130/68 137/88   Pulse: 87 86 92   Resp: 17 16 18    Temp: 97.9 F (36.6 C) 98.8 F (37.1 C)    TempSrc: Oral Oral    SpO2: 100% 100% 95% 98%  Weight:      Height:        Intake/Output Summary (Last 24 hours) at 09/07/2024 1614 Last data filed at 09/07/2024 1300 Gross per 24 hour  Intake 417 ml  Output 1700 ml  Net -1283 ml   Filed Weights   09/05/24 1547  Weight: 115 kg   Examination:  General exam: Appears calm and comfortable  Respiratory system: Clear to auscultation. Respiratory effort normal. Cardiovascular system: S1 & S2 heard, RRR. No JVD, murmurs, rubs, gallops or clicks. No pedal edema. Gastrointestinal system: Abdomen is nondistended, soft and nontender. No organomegaly or masses felt. Normal bowel sounds heard. Central nervous system: Alert and oriented. No focal neurological deficits. Extremities: Symmetric 5 x 5 power. Skin:  Psychiatry: Judgement and insight appear normal. Mood & affect appropriate.   Data Reviewed: I have personally reviewed following labs and imaging studies  CBC: Recent Labs  Lab 09/04/24 0959 09/05/24 1558 09/06/24 0102 09/06/24 0209 09/07/24 0218  WBC  --  9.9 11.0* 10.6* 8.6  NEUTROABS  --   --   --  8.4*  --   HGB 9.9* 8.9* 8.0* 8.6* 8.0*  HCT 29.0* 30.6* 27.3* 30.6* 27.7*  MCV  --  66.5* 65.9* 70.2* 67.1*  PLT  --  283 265 306 240   Basic Metabolic Panel: Recent Labs  Lab 09/04/24 0947 09/04/24 0959 09/05/24 1742 09/06/24 0102 09/06/24 0209 09/07/24 0218  NA 135 134* 133*  --  135 136  K 4.3 4.3 4.3  --  4.4 4.4  CL  --   --  92*  --  93* 99  CO2  --   --  30  --  33* 27  GLUCOSE  --   --  96  --  193* 143*  BUN  --   --  23  --  24* 22   CREATININE  --   --  1.80* 1.67* 1.80* 1.34*  CALCIUM   --   --  9.8  --  9.5 9.0   GFR: Estimated Creatinine Clearance: 47.7 mL/min (A) (by C-G formula based on SCr of 1.34 mg/dL (H)).  Liver Function Tests: Recent Labs  Lab 09/05/24 1704  AST 21  ALT 14  ALKPHOS 78  BILITOT 0.4  PROT 8.6*  ALBUMIN 3.8   Recent Labs  Lab 09/05/24 1704  LIPASE 27   HbA1C: Recent Labs    09/06/24 0209  HGBA1C 7.5*   CBG: Recent Labs  Lab 09/06/24 1140 09/06/24 1604 09/06/24 2134 09/07/24 0626 09/07/24 1108  GLUCAP 191* 201* 181* 178* 216*   Sepsis Labs: Recent Labs  Lab 09/06/24 0209 09/06/24 0952  PROCALCITON <0.10  --   LATICACIDVEN  --  1.3   Recent Results (from the past 240 hours)  Blood culture (routine x  2)     Status: None (Preliminary result)   Collection Time: 09/06/24  2:12 AM   Specimen: BLOOD LEFT HAND  Result Value Ref Range Status   Specimen Description BLOOD LEFT HAND  Final   Special Requests   Final    BOTTLES DRAWN AEROBIC ONLY Blood Culture adequate volume   Culture   Final    NO GROWTH 1 DAY Performed at [facility], [address], [place], [place] [address]    Report Status PENDING  Incomplete  Blood culture (routine x 2)     Status: None (Preliminary result)   Collection Time: 09/06/24  2:17 AM   Specimen: BLOOD RIGHT HAND  Result Value Ref Range Status   Specimen Description BLOOD RIGHT HAND  Final   Special Requests   Final    BOTTLES DRAWN AEROBIC ONLY Blood Culture adequate volume   Culture   Final    NO GROWTH 1 DAY Performed at [facility], [address] [place], [place] [address]    Report Status PENDING  Incomplete    Radiology Studies: ECHOCARDIOGRAM COMPLETE Result Date: 09/06/2024    ECHOCARDIOGRAM REPORT   Patient Name:   Sima Lindenberger Date of Exam: 09/06/2024 Medical Rec #:  [id number]               Height:       63.0 in Accession #:    [id number]              Weight:       253.5 lb Date of Birth:   December 22, 1955                BSA:          2.139 m Patient Age:    68 years                BP:           132/70 mmHg Patient Gender: F                       HR:           70 bpm. Exam Location:  Inpatient Procedure: 2D Echo, Cardiac Doppler and Color Doppler (Both Spectral and Color            Flow Doppler were utilized during procedure). Indications:    Chest Pain R07.9  History:        Patient has prior history of Echocardiogram examinations, most                 recent 11/30/2022. CHF, Angina and CAD, Pulmonary HTN and COPD,                 Arrythmias:Atrial Fibrillation and Tachycardia,                 Signs/Symptoms:Syncope and Chest Pain; Risk                 Factors:Hypertension, Sleep Apnea, Diabetes and Dyslipidemia.  Sonographer:    Thea Norlander RCS Referring Phys: BRADLY MARLA DRONES  Sonographer Comments: Image acquisition challenging due to patient body habitus. Patient declined Definity . IMPRESSIONS  1. Left ventricular ejection fraction, by estimation, is 70 to 75%. The left ventricle has hyperdynamic function. The left ventricle has no regional wall motion abnormalities. There is moderate left ventricular hypertrophy. Left ventricular diastolic parameters are indeterminate.  2. Right ventricular systolic function is normal. The right ventricular size  is mildly enlarged. There is normal pulmonary artery systolic pressure. The estimated right ventricular systolic pressure is 23.8 mmHg.  3. Left atrial size was moderately dilated.  4. Exuberant, likely caseating, mitral annular calcifications, seen on prior echos. The mitral valve is degenerative. Trivial mitral valve regurgitation. Severe mitral stenosis. The mean mitral valve gradient is 16.0 mmHg with average heart rate of 84 bpm. Severe mitral annular calcification.  5. The aortic valve is grossly normal. There is mild calcification of the aortic valve. Aortic valve regurgitation is not visualized. Aortic valve sclerosis is present, with no evidence of  aortic valve stenosis. Aortic valve mean gradient measures 8.0 mmHg.  6. The inferior vena cava is normal in size with greater than 50% respiratory variability, suggesting right atrial pressure of 3 mmHg. FINDINGS  Left Ventricle: Left ventricular ejection fraction, by estimation, is 70 to 75%. The left ventricle has hyperdynamic function. The left ventricle has no regional wall motion abnormalities. The left ventricular internal cavity size was normal in size. There is moderate left ventricular hypertrophy. Left ventricular diastolic parameters are indeterminate. Right Ventricle: The right ventricular size is mildly enlarged. No increase in right ventricular wall thickness. Right ventricular systolic function is normal. There is normal pulmonary artery systolic pressure. The tricuspid regurgitant velocity is 2.28  m/s, and with an assumed right atrial pressure of 3 mmHg, the estimated right ventricular systolic pressure is 23.8 mmHg. Left Atrium: Left atrial size was moderately dilated. Right Atrium: Right atrial size was normal in size. Pericardium: There is no evidence of pericardial effusion. Mitral Valve: Exuberant, likely caseating, mitral annular calcifications, seen on prior echos. The mitral valve is degenerative in appearance. Severe mitral annular calcification. Trivial mitral valve regurgitation. Severe mitral valve stenosis. MV peak gradient, 27.1 mmHg. The mean mitral valve gradient is 16.0 mmHg with average heart rate of 84 bpm. Tricuspid Valve: The tricuspid valve is normal in structure. Tricuspid valve regurgitation is trivial. No evidence of tricuspid stenosis. Aortic Valve: The aortic valve is grossly normal. There is mild calcification of the aortic valve. Aortic valve regurgitation is not visualized. Aortic valve sclerosis is present, with no evidence of aortic valve stenosis. Aortic valve mean gradient measures 8.0 mmHg. Aortic valve peak gradient measures 14.6 mmHg. Aortic valve area, by VTI  measures 2.34 cm. Pulmonic Valve: The pulmonic valve was normal in structure. Pulmonic valve regurgitation is trivial. No evidence of pulmonic stenosis. Aorta: The aortic root is normal in size and structure. Venous: The inferior vena cava is normal in size with greater than 50% respiratory variability, suggesting right atrial pressure of 3 mmHg. IAS/Shunts: The atrial septum is grossly normal.  LEFT VENTRICLE PLAX 2D LVIDd:         3.80 cm   Diastology LVIDs:         2.30 cm   LV e' medial:    7.40 cm/s LV PW:         1.50 cm   LV E/e' medial:  21.2 LV IVS:        0.90 cm   LV e' lateral:   9.79 cm/s LVOT diam:     2.20 cm   LV E/e' lateral: 16.0 LV SV:         78 LV SV Index:   37 LVOT Area:     3.80 cm  RIGHT VENTRICLE             IVC RV S prime:     13.70 cm/s  IVC diam: 1.80 cm  TAPSE (M-mode): 3.0 cm LEFT ATRIUM             Index        RIGHT ATRIUM           Index LA diam:        4.30 cm 2.01 cm/m   RA Area:     15.20 cm LA Vol (A2C):   58.8 ml 27.49 ml/m  RA Volume:   34.70 ml  16.22 ml/m LA Vol (A4C):   75.1 ml 35.11 ml/m LA Biplane Vol: 68.0 ml 31.80 ml/m  AORTIC VALVE AV Area (Vmax):    2.13 cm AV Area (Vmean):   2.19 cm AV Area (VTI):     2.34 cm AV Vmax:           191.00 cm/s AV Vmean:          126.000 cm/s AV VTI:            0.335 m AV Peak Grad:      14.6 mmHg AV Mean Grad:      8.0 mmHg LVOT Vmax:         107.00 cm/s LVOT Vmean:        72.600 cm/s LVOT VTI:          0.206 m LVOT/AV VTI ratio: 0.61  AORTA Ao Root diam: 3.20 cm Ao Asc diam:  3.00 cm MITRAL VALVE                TRICUSPID VALVE MV Area (PHT): 2.22 cm     TR Peak grad:   20.8 mmHg MV Area VTI:   1.05 cm     TR Vmax:        228.00 cm/s MV Peak grad:  27.1 mmHg MV Mean grad:  16.0 mmHg    SHUNTS MV Vmax:       2.60 m/s     Systemic VTI:  0.21 m MV Vmean:      193.5 cm/s   Systemic Diam: 2.20 cm MV Decel Time: 342 msec MV E velocity: 157.00 cm/s MV A velocity: 185.00 cm/s MV E/A ratio:  0.85 Soyla Merck MD Electronically  signed by Soyla Merck MD Signature Date/Time: 09/06/2024/1:12:10 PM    Final    CT Angio Chest/Abd/Pel for Dissection W and/or Wo Contrast Result Date: 09/05/2024 CLINICAL DATA:  Acute aortic syndrome, heart catheterization yesterday, chest pain, dizziness EXAM: CT ANGIOGRAPHY CHEST, ABDOMEN AND PELVIS TECHNIQUE: Non-contrast CT of the chest was initially obtained. Multidetector CT imaging through the chest, abdomen and pelvis was performed using the standard protocol during bolus administration of intravenous contrast. Multiplanar reconstructed images and MIPs were obtained and reviewed to evaluate the vascular anatomy. RADIATION DOSE REDUCTION: This exam was performed according to the departmental dose-optimization program which includes automated exposure control, adjustment of the mA and/or kV according to patient size and/or use of iterative reconstruction technique. CONTRAST:  OMNIPAQUE  IOHEXOL  350 MG/ML SOLN COMPARISON:  09/05/2024, 06/22/2024 FINDINGS: CTA CHEST FINDINGS Cardiovascular: No evidence of thoracic aortic aneurysm or dissection. Heart is unremarkable without pericardial effusion. Large 5.0 x 3.3 cm calcification extends along the mitral valve, similar to prior exam. Stable mild calcification of the aortic valve. Atherosclerosis of the aorta and coronary vasculature. There is technically adequate opacification of the pulmonary vasculature. No filling defects or pulmonary emboli. Mediastinum/Nodes: No enlarged mediastinal, hilar, or axillary lymph nodes. Thyroid  gland, trachea, and esophagus demonstrate no significant findings. Lungs/Pleura: No acute airspace disease, effusion, or pneumothorax. Scattered areas of  subsegmental atelectasis or scarring within the upper lobes. There is a 9 mm ground-glass nodule within the right lower lobe reference image 51/7, new since recent CT and therefore likely inflammatory or infectious. Central airways are patent. Musculoskeletal: No acute or  destructive bony abnormalities. Reconstructed images demonstrate no additional findings. Review of the MIP images confirms the above findings. CTA ABDOMEN AND PELVIS FINDINGS VASCULAR Aorta: Normal caliber aorta without aneurysm, dissection, vasculitis or significant stenosis. Moderate atherosclerosis. Celiac: Patent without evidence of aneurysm, dissection, vasculitis or significant stenosis. SMA: Patent without evidence of aneurysm, dissection, vasculitis or significant stenosis. Renals: Both renal arteries are patent without evidence of aneurysm, dissection, vasculitis, fibromuscular dysplasia or significant stenosis. Mild atherosclerosis at the origin of the renal arteries, left greater than right. IMA: Patent without evidence of aneurysm, dissection, vasculitis or significant stenosis. Inflow: Patent without evidence of aneurysm, dissection, vasculitis or significant stenosis. Veins: No obvious venous abnormality within the limitations of this arterial phase study. Review of the MIP images confirms the above findings. NON-VASCULAR Hepatobiliary: No focal liver abnormality is seen. No gallstones, gallbladder wall thickening, or biliary dilatation. Pancreas: Unremarkable. No pancreatic ductal dilatation or surrounding inflammatory changes. Spleen: Normal in size without focal abnormality. Adrenals/Urinary Tract: Adrenals are stable. Kidneys enhance normally and symmetrically. No urinary tract calculi or obstructive uropathy. Bladder is unremarkable. Stomach/Bowel: No bowel obstruction or ileus. Normal appendix right lower quadrant. Colonic diverticulosis without evidence of acute diverticulitis. Lymphatic: No pathologic adenopathy. Reproductive: Status post hysterectomy. No adnexal masses. Other: No free fluid or free intraperitoneal gas. No abdominal wall hernia. Musculoskeletal: No acute or destructive bony abnormalities. Reconstructed images demonstrate no additional findings. Review of the MIP images confirms  the above findings. IMPRESSION: Vascular: 1. No evidence of thoracoabdominal aortic aneurysm or dissection. 2. No evidence of pulmonary embolus. 3. Stable large amorphous calcification across the mitral valve. 4. Aortic Atherosclerosis (ICD10-I70.0). Coronary artery atherosclerosis. Nonvascular: 1. New 9 mm right lower lobe ground-glass nodule. Given the rapid development since recent CT 06/2024, this is likely inflammatory or infectious. Initial follow-up with CT at 6 months is recommended to document resolution. If persistent, repeat CT is recommended every 2 years until 5 years of stability has been established. This recommendation follows the consensus statement: Guidelines for Management of Incidental Pulmonary Nodules Detected on CT Images: From the Fleischner Society 2017; Radiology 2017; 284:228-243. 2. No acute intra-abdominal or intrapelvic process. 3. Colonic diverticulosis without diverticulitis. Electronically Signed   By: Ozell Daring M.D.   On: 09/05/2024 19:57   DG Chest 2 View Result Date: 09/05/2024 CLINICAL DATA:  Chest pain. EXAM: CHEST - 2 VIEW COMPARISON:  Chest radiograph dated 07/19/2023. FINDINGS: Mild cardiomegaly with mild vascular congestion. No focal consolidation, pleural effusion pneumothorax or acute osseous pathology. IMPRESSION: Mild cardiomegaly with mild vascular congestion. Electronically Signed   By: Vanetta Chou M.D.   On: 09/05/2024 16:39   Scheduled Meds:  apixaban   5 mg Oral BID   atorvastatin   80 mg Oral QPM   cyanocobalamin   1,000 mcg Oral Daily   ferrous sulfate   325 mg Oral Q breakfast   folic acid   1 mg Oral Daily   gabapentin   200 mg Oral QHS   hydroxychloroquine   200 mg Oral QHS   insulin  aspart  0-15 Units Subcutaneous TID WC   insulin  glargine-yfgn  20 Units Subcutaneous QHS   ipratropium-albuterol   3 mL Nebulization BID   loratadine   10 mg Oral Daily   melatonin  10 mg Oral QHS   pantoprazole   40 mg Oral QAC breakfast   Continuous  Infusions:  azithromycin  500 mg (09/06/24 1744)   cefTRIAXone  (ROCEPHIN )  IV     vancomycin  750 mg (09/07/24 0405)    LOS: 1 day   Time spent: 50 minutes  Dr. Sherre [facility] If 7PM-7AM, please contact night-coverage 09/07/2024, 4:14 PM

## 2024-09-07 NOTE — Plan of Care (Signed)

## 2024-09-08 DIAGNOSIS — J189 Pneumonia, unspecified organism: Secondary | ICD-10-CM | POA: Diagnosis not present

## 2024-09-08 LAB — CBC
HCT: 29.4 % — ABNORMAL LOW (ref 36.0–46.0)
Hemoglobin: 8.3 g/dL — ABNORMAL LOW (ref 12.0–15.0)
MCH: 19.1 pg — ABNORMAL LOW (ref 26.0–34.0)
MCHC: 28.2 g/dL — ABNORMAL LOW (ref 30.0–36.0)
MCV: 67.6 fL — ABNORMAL LOW (ref 80.0–100.0)
Platelets: 262 K/uL (ref 150–400)
RBC: 4.35 MIL/uL (ref 3.87–5.11)
RDW: 20 % — ABNORMAL HIGH (ref 11.5–15.5)
WBC: 9.7 K/uL (ref 4.0–10.5)
nRBC: 0 % (ref 0.0–0.2)

## 2024-09-08 LAB — BASIC METABOLIC PANEL WITH GFR
Anion gap: 11 (ref 5–15)
BUN: 19 mg/dL (ref 8–23)
CO2: 27 mmol/L (ref 22–32)
Calcium: 9.3 mg/dL (ref 8.9–10.3)
Chloride: 100 mmol/L (ref 98–111)
Creatinine, Ser: 1.03 mg/dL — ABNORMAL HIGH (ref 0.44–1.00)
GFR, Estimated: 58 mL/min — ABNORMAL LOW (ref >60)
Glucose, Bld: 136 mg/dL — ABNORMAL HIGH (ref 70–99)
Potassium: 4.3 mmol/L (ref 3.5–5.1)
Sodium: 138 mmol/L (ref 135–145)

## 2024-09-08 LAB — GLUCOSE, CAPILLARY
Glucose-Capillary: 156 mg/dL — ABNORMAL HIGH (ref 70–99)
Glucose-Capillary: 252 mg/dL — ABNORMAL HIGH (ref 70–99)

## 2024-09-08 MED ORDER — AZITHROMYCIN 500 MG PO TABS: 500.0000 mg | ORAL_TABLET | Freq: Every day | ORAL | 0 refills | Status: AC

## 2024-09-08 MED ORDER — GUAIFENESIN 100 MG/5ML PO LIQD: 5.0000 mL | ORAL | 0 refills | Status: AC | PRN

## 2024-09-08 MED ORDER — OXYCODONE HCL 5 MG PO TABS: 5.0000 mg | ORAL_TABLET | Freq: Three times a day (TID) | ORAL | 0 refills | Status: AC | PRN

## 2024-09-08 MED ORDER — GABAPENTIN 100 MG PO CAPS: 200.0000 mg | ORAL_CAPSULE | Freq: Every day | ORAL | 0 refills | Status: AC

## 2024-09-08 MED ORDER — CEFDINIR 300 MG PO CAPS: 300.0000 mg | ORAL_CAPSULE | Freq: Two times a day (BID) | ORAL | 0 refills | Status: AC

## 2024-09-08 MED ORDER — IPRATROPIUM-ALBUTEROL 0.5-2.5 (3) MG/3ML IN SOLN: 3.0000 mL | Freq: Two times a day (BID) | RESPIRATORY_TRACT | 0 refills | Status: AC

## 2024-09-08 NOTE — Plan of Care (Signed)
 Discharging home.  AVS reviewed by Discharge Nurse Rosario RN   Problem: Education: Goal: Understanding of cardiac disease, CV risk reduction, and recovery process will improve Outcome: Adequate for Discharge Goal: Individualized Educational Video(s) Outcome: Adequate for Discharge   Problem: Activity: Goal: Ability to tolerate increased activity will improve Outcome: Adequate for Discharge   Problem: Cardiac: Goal: Ability to achieve and maintain adequate cardiovascular perfusion will improve Outcome: Adequate for Discharge   Problem: Health Behavior/Discharge Planning: Goal: Ability to safely manage health-related needs after discharge will improve Outcome: Adequate for Discharge   Problem: Education: Goal: Ability to describe self-care measures that may prevent or decrease complications (Diabetes Survival Skills Education) will improve Outcome: Adequate for Discharge Goal: Individualized Educational Video(s) Outcome: Adequate for Discharge   Problem: Coping: Goal: Ability to adjust to condition or change in health will improve Outcome: Adequate for Discharge   Problem: Fluid Volume: Goal: Ability to maintain a balanced intake and output will improve Outcome: Adequate for Discharge   Problem: Health Behavior/Discharge Planning: Goal: Ability to identify and utilize available resources and services will improve Outcome: Adequate for Discharge Goal: Ability to manage health-related needs will improve Outcome: Adequate for Discharge   Problem: Metabolic: Goal: Ability to maintain appropriate glucose levels will improve Outcome: Adequate for Discharge   Problem: Nutritional: Goal: Maintenance of adequate nutrition will improve Outcome: Adequate for Discharge Goal: Progress toward achieving an optimal weight will improve Outcome: Adequate for Discharge   Problem: Skin Integrity: Goal: Risk for impaired skin integrity will decrease Outcome: Adequate for Discharge    Problem: Tissue Perfusion: Goal: Adequacy of tissue perfusion will improve Outcome: Adequate for Discharge   Problem: Education: Goal: Knowledge of General Education information will improve Description: Including pain rating scale, medication(s)/side effects and non-pharmacologic comfort measures Outcome: Adequate for Discharge   Problem: Health Behavior/Discharge Planning: Goal: Ability to manage health-related needs will improve Outcome: Adequate for Discharge   Problem: Clinical Measurements: Goal: Ability to maintain clinical measurements within normal limits will improve Outcome: Adequate for Discharge Goal: Will remain free from infection Outcome: Adequate for Discharge Goal: Diagnostic test results will improve Outcome: Adequate for Discharge Goal: Respiratory complications will improve Outcome: Adequate for Discharge Goal: Cardiovascular complication will be avoided Outcome: Adequate for Discharge   Problem: Activity: Goal: Risk for activity intolerance will decrease Outcome: Adequate for Discharge   Problem: Nutrition: Goal: Adequate nutrition will be maintained Outcome: Adequate for Discharge   Problem: Coping: Goal: Level of anxiety will decrease Outcome: Adequate for Discharge   Problem: Elimination: Goal: Will not experience complications related to bowel motility Outcome: Adequate for Discharge Goal: Will not experience complications related to urinary retention Outcome: Adequate for Discharge   Problem: Pain Managment: Goal: General experience of comfort will improve and/or be controlled Outcome: Adequate for Discharge   Problem: Safety: Goal: Ability to remain free from injury will improve Outcome: Adequate for Discharge   Problem: Skin Integrity: Goal: Risk for impaired skin integrity will decrease Outcome: Adequate for Discharge   Problem: Acute Rehab OT Goals (only OT should resolve) Goal: Pt. Will Perform Upper Body Dressing Outcome:  Adequate for Discharge Goal: Pt. Will Perform Lower Body Dressing Outcome: Adequate for Discharge Goal: Pt. Will Transfer To Toilet Outcome: Adequate for Discharge Goal: Pt. Will Perform Tub/Shower Transfer Outcome: Adequate for Discharge   Problem: Acute Rehab PT Goals(only PT should resolve) Goal: Patient Will Transfer Sit To/From Stand Outcome: Adequate for Discharge Goal: Pt Will Ambulate Outcome: Adequate for Discharge Goal: Pt Will Go Up/Down  Stairs Outcome: Adequate for Discharge Goal: Pt/caregiver will Perform Home Exercise Program Outcome: Adequate for Discharge

## 2024-09-08 NOTE — TOC CM/SW Note (Addendum)
 Transition of Care [facility]) - Inpatient Brief Assessment   Patient Details  Name: Lynn [name] MRN: [id number] Date of Birth: 27-Jul-1956  Transition of Care [facility]) CM/SW Contact:    Waddell Barnie Rama, RN Phone Number: 09/08/2024, 8:43 AM   Clinical Narrative: From home with spouse, has PCP and insurance on file, states has no HH services in place at this time , has oxygen  3 liters(inogen) , chair lift, walker x 2,  at home.  States family member  (spouse) will transport them home at dc  and will bring home oxygen  and family is support system, states gets medications from [facility] on [address], fine with [facility] pharmacy filling meds.  Pta self ambulatory with walker.  Per PT eval rec HHPT, HHOT, NCM offered choice she has no preference.  NCM sent referral thru portal to [facility], they can accept, soc will begin 24 to 48 hrs post dc.     Transition of Care Asessment: Insurance and Status: Insurance coverage has been reviewed Patient has primary care physician: Yes Home environment has been reviewed: home with spouse Prior level of function:: ambulatory with walker Prior/Current Home Services: Current home services (oxygen  3 liters(inogen) , chair lift, walker x 2,) Social Drivers of Health Review: SDOH reviewed no interventions necessary Readmission risk has been reviewed: Yes Transition of care needs: transition of care needs identified, TOC will continue to follow

## 2024-09-08 NOTE — Progress Notes (Addendum)
 Discharge Nurse Summary: DC order noted per MD. DC RN at bedside with patient. Patient agreeable with discharge plan, states family will arrive soon for pickup. AVS printed/reviewed. PIV removed, skin intact. No DME needs. No home meds. TOC meds delivered to the patient. CP/Edu resolved. Telemonitor returned to charging station. All belongings accounted for. Patient getting dressed, husband on the way for pickup. Front desk notified.  Rosario EMERSON Lund, RN

## 2024-09-08 NOTE — TOC Transition Note (Signed)
 Transition of Care [facility]) - Discharge Note   Patient Details  Name: Lynn [name] MRN: [id number] Date of Birth: 05-Jul-1956  Transition of Care [facility]) CM/SW Contact:  Waddell Barnie Rama, RN Phone Number: 09/08/2024, 10:26 AM   Clinical Narrative:    For dc today, she is set up with [facility].  NCM notified [facility].  She has transport and her spouse will bring her oxygen  to go home with.         Patient Goals and CMS Choice            Discharge Placement                       Discharge Plan and Services Additional resources added to the After Visit Summary for                                       Social Drivers of Health (SDOH) Interventions SDOH Screenings   Food Insecurity: No Food Insecurity (09/06/2024)  Housing: Low Risk  (09/06/2024)  Transportation Needs: No Transportation Needs (09/06/2024)  Utilities: Not At Risk (09/06/2024)  Alcohol Screen: Low Risk  (06/23/2024)  Depression (PHQ2-9): Low Risk  (06/23/2024)  Financial Resource Strain: Low Risk  (06/23/2024)  Physical Activity: Inactive (06/23/2024)  Social Connections: Moderately Isolated (09/06/2024)  Stress: No Stress Concern Present (06/23/2024)  Recent Concern: Stress - Stress Concern Present (06/10/2024)  Tobacco Use: Medium Risk (09/06/2024)  Health Literacy: Adequate Health Literacy (06/23/2024)     Readmission Risk Interventions    09/08/2024    8:34 AM 07/18/2023    3:38 PM  Readmission Risk Prevention Plan  Transportation Screening Complete Complete  PCP or Specialist Appt within 5-7 Days Complete Complete  Home Care Screening Complete Complete  Medication Review (RN CM) Complete Complete

## 2024-09-08 NOTE — Discharge Summary (Signed)
 Physician Discharge Summary   Patient: Lynn [name] MRN: [id number] DOB: 01-08-1956  Admit date:     09/05/2024  Discharge date: 09/08/24  Discharge Physician: Dr. Sherre   PCP: Watt [name], MD   Recommendations at discharge:   Complete azithromycin  500 mg daily PO and cefdinir  300 mg PO daily starting 11/26 for two more doses to complete a 5 day course.  Gabapentin  200 mg at bedtime, 30 days supply prescribed, no refills. Please contact pcp for refills. Please follow up with your primary care provider within 2 weeks of discharge.  Oxycodone  5 mg q8h prn for moderate/severe pain, 2 days supply prescribed.  Discharge Diagnoses: Principal Problem:   CAP (community acquired pneumonia) Active Problems:   ILD (interstitial lung disease) (HCC)   Coronary artery disease, non-occlusive   Chronic respiratory failure with hypoxia (HCC)   COPD (chronic obstructive pulmonary disease) (HCC)   Essential hypertension   Type 2 diabetes mellitus with hyperlipidemia (HCC)   PAF (paroxysmal atrial fibrillation) (HCC); CHA2DS2-VASc score 7   Dyslipidemia   Obesity, Class III, BMI 40-49.9 (morbid obesity) (HCC)   Rheumatoid arthritis (HCC)   PAT (paroxysmal atrial tachycardia)   Anxiety   OSA on CPAP   Shortness of breath   IDA (iron deficiency anemia)   Chest pain   Elevated troponin   Chronic back pain  Resolved Problems:   * No resolved hospital problems. [facility] Course:  Lynn [name] is a 68 year old female with history of NIDDM2, paroxysmal atrial fibrillation, rheumatoid arthritis, OSA on CPAP, pulmonary hypertension, hyperlipidemia, hypertension, GERD, morbid obesity, interstitial lung disease on chronic 3 L nasal cannula, who presents to ED for chief concerns of chest pain.   Patient presented to the ED on 09/05/2024.  09/05/2024: Patient admitted to [facility] hospitalist service for chest pain suspect secondary to right lower lobe pneumonia. 11/22: Assumed  care. 11/23: Added azithromycin  500 mg IV, discontinue cefepime .  Ordered ceftriaxone  2 g IV daily.  11/24: mrsa pcr has not resulted at this time. Day 2 of abx. Update:  MRSA PCR noted to be negative.  Vancomycin  per pharmacy has been discontinued.  11/25: Patient states she feels better today. She states she is ready for discharge. She states she slept well last night, especially with the new gabapentin  ordered for her.  P.o. antibiotic has been prescribed to complete a 5-day course.  Assessment and Plan: * CAP (community acquired pneumonia) Azithromycin  500 mg IV daily, ceftriaxone  2 g IV daily to complete a 5-day course Discontinue cefepime  Check MRSA PCR, message nursing.  If negative vancomycin  will be discontinued 11/24: MRSA PCR is in process and has not resulted.  Message nursing regarding follow-up of this.  Once MRSA PCR results is negative, vancomycin  can be discontinued.  ILD (interstitial lung disease) (HCC) Not in exacerbation  DuoNebs every 6 hours.  For wheezing and shortness of breath.  COPD (chronic obstructive pulmonary disease) (HCC) Not in acute exacerbation  Obesity, Class III, BMI 40-49.9 (morbid obesity) (HCC) This complicates overall care and prognosis.   Dyslipidemia Home atorvastatin  80 mg every evening resumed  Type 2 diabetes mellitus with hyperlipidemia (HCC) Home long-acting insulin  equivalent 20 units nightly resumed Insulin  SSI Goal inpatient blood glucose levels 140-180  Essential hypertension Hydralazine  5 mg IV every 6 hours as needed for SBP greater 165, 5 days ordered  Rheumatoid arthritis (HCC) Home hydroxychloroquine  200 mg nightly resumed  Chronic back pain Neck sciatica Gabapentin  200 mg nightly initiated as a trial  Elevated  troponin Suspect demand ischemia, low clinical suspicion for ACS  Chest pain Generalized body aches, secondary to pneumonia  Shortness of breath With chest pain Suspect secondary to right lower lobe  pneumonia, treat per above  OSA on CPAP CPAP nightly ordered  Anxiety Home alprazolam  0.25 mg p.o. twice daily as needed for anxiety resumed Trazodone  50-100 mg nightly as needed for sleep     Pain control - [place]  Controlled Substance Reporting System database was reviewed. and patient was instructed, not to drive, operate heavy machinery, perform activities at heights, swimming or participation in water  activities or provide baby-sitting services while on Pain, Sleep and Anxiety Medications; until their outpatient Physician has advised to do so again. Also recommended to not to take more than prescribed Pain, Sleep and Anxiety Medications.  Consultants: PT, OT, TOC Procedures performed: None Disposition: Home Diet recommendation:  Carb modified diet DISCHARGE MEDICATION: Allergies as of 09/08/2024       Reactions   Methotrexate Other (See Comments)   Pneumonitis   Penicillins Hives   Childhood allergy   Farxiga  [dapagliflozin ] Other (See Comments)   Dizzy and lethargic   Metformin  And Related Diarrhea, Other (See Comments)   Also, bleeding        Medication List     TAKE these medications    acetaminophen  500 MG tablet Commonly known as: TYLENOL  Take 2,000 mg by mouth daily at 12 noon.   albuterol  (2.5 MG/3ML) 0.083% nebulizer solution Commonly known as: PROVENTIL  INHALE 3 ML BY NEBULIZATION EVERY 6 HOURS AS NEEDED FOR WHEEZING OR SHORTNESS OF BREATH What changed: See the new instructions.   albuterol  108 (90 Base) MCG/ACT inhaler Commonly known as: VENTOLIN  HFA TAKE 2 PUFFS BY MOUTH EVERY 6 HOURS AS NEEDED FOR WHEEZE OR SHORTNESS OF BREATH What changed: Another medication with the same name was changed. Make sure you understand how and when to take each.   ALPRAZolam  0.25 MG tablet Commonly known as: XANAX  Take 1 tablet (0.25 mg total) by mouth 2 (two) times daily as needed for anxiety. TAKE 1-2 TABLETS BY MOUTH 2 TIMES DAILY AS NEEDED FOR ANXIETY OR  INSOMNIA What changed:  when to take this additional instructions   apixaban  5 MG Tabs tablet Commonly known as: ELIQUIS  Take 1 tablet (5 mg total) by mouth 2 (two) times daily.   atorvastatin  80 MG tablet Commonly known as: LIPITOR  Take 1 tablet (80 mg total) by mouth every evening.   azithromycin  500 MG tablet Commonly known as: Zithromax  Take 1 tablet (500 mg total) by mouth daily for 2 days. Start taking on: September 09, 2024   cefdinir  300 MG capsule Commonly known as: OMNICEF  Take 1 capsule (300 mg total) by mouth 2 (two) times daily for 2 days. Start taking on: September 09, 2024   cetirizine 10 MG tablet Commonly known as: ZYRTEC Take 10 mg by mouth daily as needed for allergies.   Contour Next Test test strip Generic drug: glucose blood 3x daily   cyanocobalamin  1000 MCG tablet Commonly known as: VITAMIN B12 Take 1 tablet (1,000 mcg total) by mouth daily.   Dexcom G7 Sensor Misc 1 Device by Does not apply route as directed.   diltiazem  360 MG 24 hr capsule Commonly known as: CARDIZEM  CD Take 1 capsule (360 mg total) by mouth daily.   diphenhydramine-acetaminophen  25-500 MG Tabs tablet Commonly known as: TYLENOL  PM Take 2 tablets by mouth at bedtime.   ferrous sulfate  325 (65 FE) MG EC tablet Take 1 tablet (  325 mg total) by mouth daily with breakfast.   folic acid  800 MCG tablet Commonly known as: FOLVITE  Take 800 mcg by mouth every other day.   furosemide  40 MG tablet Commonly known as: LASIX  Take 80 mg by mouth in the morning and an additional 40 mg for an overnight weight gain of 3 pounds or 5 pounds in a week   gabapentin  100 MG capsule Commonly known as: NEURONTIN  Take 2 capsules (200 mg total) by mouth at bedtime.   guaiFENesin  100 MG/5ML liquid Commonly known as: ROBITUSSIN Take 5 mLs by mouth every 4 (four) hours as needed for cough or to loosen phlegm.   HM MULTIVITAMIN ADULT GUMMY PO Take 2 each by mouth daily.   hydroxychloroquine   200 MG tablet Commonly known as: PLAQUENIL  Take 200 mg by mouth at bedtime.   insulin  lispro 100 UNIT/ML injection Commonly known as: HUMALOG  Max Daily 70 units per pump   ipratropium-albuterol  0.5-2.5 (3) MG/3ML Soln Commonly known as: DUONEB Take 3 mLs by nebulization 2 (two) times daily.   isosorbide  mononitrate 30 MG 24 hr tablet Commonly known as: IMDUR  Take 1 tablet (30 mg total) by mouth daily.   Lancets 30G Misc 1 Device by Does not apply route 3 (three) times daily.   losartan  100 MG tablet Commonly known as: COZAAR  Take 0.5 tablets (50 mg total) by mouth daily.   melatonin 5 MG Tabs Take 20 mg by mouth at bedtime.   nitroGLYCERIN  0.4 MG SL tablet Commonly known as: NITROSTAT  Place 1 tablet (0.4 mg total) under the tongue every 5 (five) minutes as needed for chest pain.   Omnipod 5 G7 Pods (Gen 5) Misc 1 Device by Other route every other day.   oxyCODONE  5 MG immediate release tablet Commonly known as: Oxy IR/ROXICODONE  Take 1 tablet (5 mg total) by mouth every 8 (eight) hours as needed for up to 3 days for moderate pain (pain score 4-6) or severe pain (pain score 7-10).   pantoprazole  40 MG tablet Commonly known as: PROTONIX  Take 1 tablet (40 mg total) by mouth daily before breakfast. What changed:  when to take this reasons to take this   spironolactone  25 MG tablet Commonly known as: ALDACTONE  Take 1 tablet (25 mg total) by mouth daily.   Mounjaro  12.5 MG/0.5ML Pen Generic drug: tirzepatide  Inject 12.5 mg into the skin once a week.   tirzepatide  15 MG/0.5ML Pen Commonly known as: MOUNJARO  Inject 15 mg into the skin once a week.   traZODone  50 MG tablet Commonly known as: DESYREL  Take 0.5-1 tablets (25-50 mg total) by mouth at bedtime as needed for sleep. Needs appt What changed: how much to take   VICKS SINEX NA Place 1 spray into both nostrils every 12 (twelve) hours as needed (for congestion- MAX OF THREE CONSECUTIVE DAYS).                Durable Medical Equipment  (From admission, onward)           Start     Ordered   09/08/24 0000  For home use only DME Nebulizer machine       Question Answer Comment  Patient needs a nebulizer to treat with the following condition Shortness of breath   Patient needs a nebulizer to treat with the following condition Coughing   Length of Need 12 Months   Additional equipment included Administration kit      09/08/24 1029  Contact information for follow-up providers     Copland, [name], MD Follow up on 09/16/2024.   Specialty: Family Medicine Why: 11:00 , please arrive 15 mins early, please bring copay Contact information: [address] [place] [address] [phone]              Contact information for after-discharge care     Home Medical Care     [facility] [place] [facility]) .   Service: Home Health Services Why: Agency will call you to set up apt times Contact information: [address] [place]  [phone]                    Discharge Exam: Lynn [name]   09/05/24 1547  Weight: 115 kg   Physical Exam Constitutional:      Appearance: She is obese.  HENT:     Head: Normocephalic and atraumatic.  Eyes:     Extraocular Movements: Extraocular movements intact.     Pupils: Pupils are equal, round, and reactive to light.  Cardiovascular:     Rate and Rhythm: Normal rate and regular rhythm.     Heart sounds: Normal heart sounds. No murmur heard. Pulmonary:     Effort: Pulmonary effort is normal.     Breath sounds: No decreased breath sounds or wheezing.  Chest:     Chest wall: No mass.  Abdominal:     General: Bowel sounds are normal.     Palpations: Abdomen is soft.     Comments: Morbidly obese abdomen  Musculoskeletal:        General: Normal range of motion.     Cervical back: Normal range of motion and neck supple.     Right lower  leg: No edema.     Left lower leg: No edema.  Skin:    General: Skin is warm and dry.     Capillary Refill: Capillary refill takes less than 2 seconds.  Neurological:     General: No focal deficit present.     Mental Status: She is alert and oriented to person, place, and time.  Psychiatric:        Mood and Affect: Mood normal.        Behavior: Behavior normal.   Condition at discharge: good  The results of significant diagnostics from this hospitalization (including imaging, microbiology, ancillary and laboratory) are listed below for reference.   Imaging Studies: ECHOCARDIOGRAM COMPLETE Result Date: 09/06/2024    ECHOCARDIOGRAM REPORT   Patient Name:   Lynn [name] Date of Exam: 09/06/2024 Medical Rec #:  [id number]               Height:       63.0 in Accession #:    [id number]              Weight:       253.5 lb Date of Birth:  1956-01-16                BSA:          2.139 m Patient Age:    68 years                BP:           132/70 mmHg Patient Gender: F                       HR:  70 bpm. Exam Location:  Inpatient Procedure: 2D Echo, Cardiac Doppler and Color Doppler (Both Spectral and Color            Flow Doppler were utilized during procedure). Indications:    Chest Pain R07.9  History:        Patient has prior history of Echocardiogram examinations, most                 recent 11/30/2022. CHF, Angina and CAD, Pulmonary HTN and COPD,                 Arrythmias:Atrial Fibrillation and Tachycardia,                 Signs/Symptoms:Syncope and Chest Pain; Risk                 Factors:Hypertension, Sleep Apnea, Diabetes and Dyslipidemia.  Sonographer:    Thea Norlander RCS Referring Phys: BRADLY MARLA DRONES  Sonographer Comments: Image acquisition challenging due to patient body habitus. Patient declined Definity . IMPRESSIONS  1. Left ventricular ejection fraction, by estimation, is 70 to 75%. The left ventricle has hyperdynamic function. The left ventricle has no regional wall  motion abnormalities. There is moderate left ventricular hypertrophy. Left ventricular diastolic parameters are indeterminate.  2. Right ventricular systolic function is normal. The right ventricular size is mildly enlarged. There is normal pulmonary artery systolic pressure. The estimated right ventricular systolic pressure is 23.8 mmHg.  3. Left atrial size was moderately dilated.  4. Exuberant, likely caseating, mitral annular calcifications, seen on prior echos. The mitral valve is degenerative. Trivial mitral valve regurgitation. Severe mitral stenosis. The mean mitral valve gradient is 16.0 mmHg with average heart rate of 84 bpm. Severe mitral annular calcification.  5. The aortic valve is grossly normal. There is mild calcification of the aortic valve. Aortic valve regurgitation is not visualized. Aortic valve sclerosis is present, with no evidence of aortic valve stenosis. Aortic valve mean gradient measures 8.0 mmHg.  6. The inferior vena cava is normal in size with greater than 50% respiratory variability, suggesting right atrial pressure of 3 mmHg. FINDINGS  Left Ventricle: Left ventricular ejection fraction, by estimation, is 70 to 75%. The left ventricle has hyperdynamic function. The left ventricle has no regional wall motion abnormalities. The left ventricular internal cavity size was normal in size. There is moderate left ventricular hypertrophy. Left ventricular diastolic parameters are indeterminate. Right Ventricle: The right ventricular size is mildly enlarged. No increase in right ventricular wall thickness. Right ventricular systolic function is normal. There is normal pulmonary artery systolic pressure. The tricuspid regurgitant velocity is 2.28  m/s, and with an assumed right atrial pressure of 3 mmHg, the estimated right ventricular systolic pressure is 23.8 mmHg. Left Atrium: Left atrial size was moderately dilated. Right Atrium: Right atrial size was normal in size. Pericardium: There is  no evidence of pericardial effusion. Mitral Valve: Exuberant, likely caseating, mitral annular calcifications, seen on prior echos. The mitral valve is degenerative in appearance. Severe mitral annular calcification. Trivial mitral valve regurgitation. Severe mitral valve stenosis. MV peak gradient, 27.1 mmHg. The mean mitral valve gradient is 16.0 mmHg with average heart rate of 84 bpm. Tricuspid Valve: The tricuspid valve is normal in structure. Tricuspid valve regurgitation is trivial. No evidence of tricuspid stenosis. Aortic Valve: The aortic valve is grossly normal. There is mild calcification of the aortic valve. Aortic valve regurgitation is not visualized. Aortic valve sclerosis is present, with no evidence of aortic valve stenosis. Aortic  valve mean gradient measures 8.0 mmHg. Aortic valve peak gradient measures 14.6 mmHg. Aortic valve area, by VTI measures 2.34 cm. Pulmonic Valve: The pulmonic valve was normal in structure. Pulmonic valve regurgitation is trivial. No evidence of pulmonic stenosis. Aorta: The aortic root is normal in size and structure. Venous: The inferior vena cava is normal in size with greater than 50% respiratory variability, suggesting right atrial pressure of 3 mmHg. IAS/Shunts: The atrial septum is grossly normal.  LEFT VENTRICLE PLAX 2D LVIDd:         3.80 cm   Diastology LVIDs:         2.30 cm   LV e' medial:    7.40 cm/s LV PW:         1.50 cm   LV E/e' medial:  21.2 LV IVS:        0.90 cm   LV e' lateral:   9.79 cm/s LVOT diam:     2.20 cm   LV E/e' lateral: 16.0 LV SV:         78 LV SV Index:   37 LVOT Area:     3.80 cm  RIGHT VENTRICLE             IVC RV S prime:     13.70 cm/s  IVC diam: 1.80 cm TAPSE (M-mode): 3.0 cm LEFT ATRIUM             Index        RIGHT ATRIUM           Index LA diam:        4.30 cm 2.01 cm/m   RA Area:     15.20 cm LA Vol (A2C):   58.8 ml 27.49 ml/m  RA Volume:   34.70 ml  16.22 ml/m LA Vol (A4C):   75.1 ml 35.11 ml/m LA Biplane Vol: 68.0 ml  31.80 ml/m  AORTIC VALVE AV Area (Vmax):    2.13 cm AV Area (Vmean):   2.19 cm AV Area (VTI):     2.34 cm AV Vmax:           191.00 cm/s AV Vmean:          126.000 cm/s AV VTI:            0.335 m AV Peak Grad:      14.6 mmHg AV Mean Grad:      8.0 mmHg LVOT Vmax:         107.00 cm/s LVOT Vmean:        72.600 cm/s LVOT VTI:          0.206 m LVOT/AV VTI ratio: 0.61  AORTA Ao Root diam: 3.20 cm Ao Asc diam:  3.00 cm MITRAL VALVE                TRICUSPID VALVE MV Area (PHT): 2.22 cm     TR Peak grad:   20.8 mmHg MV Area VTI:   1.05 cm     TR Vmax:        228.00 cm/s MV Peak grad:  27.1 mmHg MV Mean grad:  16.0 mmHg    SHUNTS MV Vmax:       2.60 m/s     Systemic VTI:  0.21 m MV Vmean:      193.5 cm/s   Systemic Diam: 2.20 cm MV Decel Time: 342 msec MV E velocity: 157.00 cm/s MV A velocity: 185.00 cm/s MV E/A ratio:  0.85 Soyla Merck MD Electronically signed by  Soyla Merck MD Signature Date/Time: 09/06/2024/1:12:10 PM    Final    CT Angio Chest/Abd/Pel for Dissection W and/or Wo Contrast Result Date: 09/05/2024 CLINICAL DATA:  Acute aortic syndrome, heart catheterization yesterday, chest pain, dizziness EXAM: CT ANGIOGRAPHY CHEST, ABDOMEN AND PELVIS TECHNIQUE: Non-contrast CT of the chest was initially obtained. Multidetector CT imaging through the chest, abdomen and pelvis was performed using the standard protocol during bolus administration of intravenous contrast. Multiplanar reconstructed images and MIPs were obtained and reviewed to evaluate the vascular anatomy. RADIATION DOSE REDUCTION: This exam was performed according to the departmental dose-optimization program which includes automated exposure control, adjustment of the mA and/or kV according to patient size and/or use of iterative reconstruction technique. CONTRAST:  OMNIPAQUE  IOHEXOL  350 MG/ML SOLN COMPARISON:  09/05/2024, 06/22/2024 FINDINGS: CTA CHEST FINDINGS Cardiovascular: No evidence of thoracic aortic aneurysm or dissection.  Heart is unremarkable without pericardial effusion. Large 5.0 x 3.3 cm calcification extends along the mitral valve, similar to prior exam. Stable mild calcification of the aortic valve. Atherosclerosis of the aorta and coronary vasculature. There is technically adequate opacification of the pulmonary vasculature. No filling defects or pulmonary emboli. Mediastinum/Nodes: No enlarged mediastinal, hilar, or axillary lymph nodes. Thyroid  gland, trachea, and esophagus demonstrate no significant findings. Lungs/Pleura: No acute airspace disease, effusion, or pneumothorax. Scattered areas of subsegmental atelectasis or scarring within the upper lobes. There is a 9 mm ground-glass nodule within the right lower lobe reference image 51/7, new since recent CT and therefore likely inflammatory or infectious. Central airways are patent. Musculoskeletal: No acute or destructive bony abnormalities. Reconstructed images demonstrate no additional findings. Review of the MIP images confirms the above findings. CTA ABDOMEN AND PELVIS FINDINGS VASCULAR Aorta: Normal caliber aorta without aneurysm, dissection, vasculitis or significant stenosis. Moderate atherosclerosis. Celiac: Patent without evidence of aneurysm, dissection, vasculitis or significant stenosis. SMA: Patent without evidence of aneurysm, dissection, vasculitis or significant stenosis. Renals: Both renal arteries are patent without evidence of aneurysm, dissection, vasculitis, fibromuscular dysplasia or significant stenosis. Mild atherosclerosis at the origin of the renal arteries, left greater than right. IMA: Patent without evidence of aneurysm, dissection, vasculitis or significant stenosis. Inflow: Patent without evidence of aneurysm, dissection, vasculitis or significant stenosis. Veins: No obvious venous abnormality within the limitations of this arterial phase study. Review of the MIP images confirms the above findings. NON-VASCULAR Hepatobiliary: No focal liver  abnormality is seen. No gallstones, gallbladder wall thickening, or biliary dilatation. Pancreas: Unremarkable. No pancreatic ductal dilatation or surrounding inflammatory changes. Spleen: Normal in size without focal abnormality. Adrenals/Urinary Tract: Adrenals are stable. Kidneys enhance normally and symmetrically. No urinary tract calculi or obstructive uropathy. Bladder is unremarkable. Stomach/Bowel: No bowel obstruction or ileus. Normal appendix right lower quadrant. Colonic diverticulosis without evidence of acute diverticulitis. Lymphatic: No pathologic adenopathy. Reproductive: Status post hysterectomy. No adnexal masses. Other: No free fluid or free intraperitoneal gas. No abdominal wall hernia. Musculoskeletal: No acute or destructive bony abnormalities. Reconstructed images demonstrate no additional findings. Review of the MIP images confirms the above findings. IMPRESSION: Vascular: 1. No evidence of thoracoabdominal aortic aneurysm or dissection. 2. No evidence of pulmonary embolus. 3. Stable large amorphous calcification across the mitral valve. 4. Aortic Atherosclerosis (ICD10-I70.0). Coronary artery atherosclerosis. Nonvascular: 1. New 9 mm right lower lobe ground-glass nodule. Given the rapid development since recent CT 06/2024, this is likely inflammatory or infectious. Initial follow-up with CT at 6 months is recommended to document resolution. If persistent, repeat CT is recommended every 2 years until 5 years  of stability has been established. This recommendation follows the consensus statement: Guidelines for Management of Incidental Pulmonary Nodules Detected on CT Images: From the Fleischner Society 2017; Radiology 2017; 284:228-243. 2. No acute intra-abdominal or intrapelvic process. 3. Colonic diverticulosis without diverticulitis. Electronically Signed   By: Ozell Daring M.D.   On: 09/05/2024 19:57   DG Chest 2 View Result Date: 09/05/2024 CLINICAL DATA:  Chest pain. EXAM: CHEST -  2 VIEW COMPARISON:  Chest radiograph dated 07/19/2023. FINDINGS: Mild cardiomegaly with mild vascular congestion. No focal consolidation, pleural effusion pneumothorax or acute osseous pathology. IMPRESSION: Mild cardiomegaly with mild vascular congestion. Electronically Signed   By: Vanetta Chou M.D.   On: 09/05/2024 16:39   CARDIAC CATHETERIZATION Result Date: 09/04/2024 Images from the original result were not included.   Prox LAD to Mid LAD lesion is 25% stenosed with 25% stenosed side branch in 2nd Diag.  Mid LAD to Dist LAD lesion is 30% stenosed.   Ost Cx lesion is 40% stenosed.   Ost RCA to Prox RCA lesion is 40% stenosed. Prox RCA to Mid RCA lesion is 20% stenosed.   Hemodynamic findings consistent with severe pulmonary hypertension.   There is mild aortic valve stenosis.   There is moderate mitral valve stenosis. Dominance: Right POST-OPERATIVE DIAGNOSIS:  Angiographically minimal CAD with calcified minimal RCA disease over most 40% proximally, 30% eccentric LAD D1 and 20% D2 otherwise no CAD Severe Mixed Pulmonary Hypertension: Transpulmonary gradient near 20 with mean PAP 48 mmHg, PCWP 29 Miller mercury and LVEDP of 21 mmHg. => This is consistent with intrinsic pulmonary hypertension, LV diastolic dysfunction and mitral stenosis. RAP mean 19 mm; RVP-EDP 70/11-19 mmHg; PAP-mean 69/30-48 mm Q; PCWP 29-30 mmHg. PVR 2.88. LVP-EDP 133/10 -21 mmHg; AOP-MAP 130/64-92 mmHg. Ao sat 91%, PA sat 55%.  Normal Fick Cardiac Output-Index: 6.6-3.09 PLAN   In the absence of any other complications or medical issues, we expect the patient to be ready for discharge from a cath perspective on 09/04/2024.   Plan is to titrate up her diuretic-increased from 40 mg in the morning to 80 mg.  (80 mg IV given in the PACU holding area); Continue to titrate GDMT for afterload reduction/treatment of diastolic heart failure. Continue to counsel the patient about decreasing salt intake. Continue home oxygen    Recommend Aspirin   81mg  daily for moderate CAD. Alm Clay, MD  Microbiology: Results for orders placed or performed during the hospital encounter of 09/05/24  MRSA Next Gen by PCR, Nasal     Status: None   Collection Time: 09/06/24  1:45 AM   Specimen: Nasal Mucosa; Nasal Swab  Result Value Ref Range Status   MRSA by PCR Next Gen NOT DETECTED NOT DETECTED Final    Comment: (NOTE) The GeneXpert MRSA Assay (FDA approved for NASAL specimens only), is one component of a comprehensive MRSA colonization surveillance program. It is not intended to diagnose MRSA infection nor to guide or monitor treatment for MRSA infections. Test performance is not FDA approved in patients less than 31 years old. Performed at [facility], [address], [place], [place] [address]   Blood culture (routine x 2)     Status: None (Preliminary result)   Collection Time: 09/06/24  2:12 AM   Specimen: BLOOD LEFT HAND  Result Value Ref Range Status   Specimen Description BLOOD LEFT HAND  Final   Special Requests   Final    BOTTLES DRAWN AEROBIC ONLY Blood Culture adequate volume   Culture  Final    NO GROWTH 2 DAYS Performed at [facility], [address], [place], [place] [address]    Report Status PENDING  Incomplete  Blood culture (routine x 2)     Status: None (Preliminary result)   Collection Time: 09/06/24  2:17 AM   Specimen: BLOOD RIGHT HAND  Result Value Ref Range Status   Specimen Description BLOOD RIGHT HAND  Final   Special Requests   Final    BOTTLES DRAWN AEROBIC ONLY Blood Culture adequate volume   Culture   Final    NO GROWTH 2 DAYS Performed at [facility], [address], [place], [place] [address]    Report Status PENDING  Incomplete   Labs: CBC: Recent Labs  Lab 09/05/24 1558 09/06/24 0102 09/06/24 0209 09/07/24 0218 09/08/24 0230  WBC 9.9 11.0* 10.6* 8.6 9.7  NEUTROABS  --   --  8.4*  --   --   HGB 8.9* 8.0* 8.6* 8.0* 8.3*  HCT 30.6* 27.3* 30.6* 27.7* 29.4*  MCV  66.5* 65.9* 70.2* 67.1* 67.6*  PLT 283 265 306 240 262   Basic Metabolic Panel: Recent Labs  Lab 09/04/24 0959 09/05/24 1742 09/06/24 0102 09/06/24 0209 09/07/24 0218 09/08/24 0230  NA 134* 133*  --  135 136 138  K 4.3 4.3  --  4.4 4.4 4.3  CL  --  92*  --  93* 99 100  CO2  --  30  --  33* 27 27  GLUCOSE  --  96  --  193* 143* 136*  BUN  --  23  --  24* 22 19  CREATININE  --  1.80* 1.67* 1.80* 1.34* 1.03*  CALCIUM   --  9.8  --  9.5 9.0 9.3   Liver Function Tests: Recent Labs  Lab 09/05/24 1704  AST 21  ALT 14  ALKPHOS 78  BILITOT 0.4  PROT 8.6*  ALBUMIN 3.8   CBG: Recent Labs  Lab 09/07/24 1108 09/07/24 1618 09/07/24 2148 09/08/24 0653 09/08/24 0831  GLUCAP 216* 155* 218* 156* 252*   Discharge time spent: greater than 30 minutes.  Signed: Dr. Sherre [facility] 09/08/2024

## 2024-09-09 ENCOUNTER — Telehealth: Payer: Self-pay

## 2024-09-09 NOTE — Transitions of Care (Post Inpatient/ED Visit) (Signed)
 09/09/2024  Name: Lynn [name] MRN: [id number] DOB: 06-07-1956  Today's TOC FU Call Status: Today's TOC FU Call Status:: Successful TOC FU Call Completed TOC FU Call Complete Date: 09/09/24  Patient's Name and Date of Birth confirmed. Name, DOB  Transition Care Management Follow-up Telephone Call Date of Discharge: 09/08/24 Discharge Facility: [facility]) Type of Discharge: Inpatient Admission Primary Inpatient Discharge Diagnosis:: CAP How have you been since you were released from the hospital?: Better Any questions or concerns?: No  Items Reviewed: Did you receive and understand the discharge instructions provided?: Yes Medications obtained,verified, and reconciled?: Yes (Medications Reviewed) Any new allergies since your discharge?: No Dietary orders reviewed?: NA Do you have support at home?: Yes People in Home [RPT]: spouse Name of Support/Comfort Primary Source: Lynn [name]  Medications Reviewed Today: Medications Reviewed Today     Reviewed by Moises Reusing, RN (Case Manager) on 09/09/24 at 1100  Med List Status: <None>   Medication Order Taking? Sig Documenting Provider Last Dose Status Informant  acetaminophen  (TYLENOL ) 500 MG tablet [id number]  Take 2,000 mg by mouth daily at 12 noon. [provider]  Active Self, Pharmacy Records  albuterol  (PROVENTIL ) (2.5 MG/3ML) 0.083% nebulizer solution [id number]  INHALE 3 ML BY NEBULIZATION EVERY 6 HOURS AS NEEDED FOR WHEEZING OR SHORTNESS OF BREATH  Patient taking differently: Take 2.5 mg by nebulization 2 (two) times daily.   [name], NP  Active Self, Pharmacy Records  albuterol  (VENTOLIN  HFA) [phone] Base) MCG/ACT inhaler [id number]  TAKE 2 PUFFS BY MOUTH EVERY 6 HOURS AS NEEDED FOR WHEEZE OR SHORTNESS OF [name], MD  Active Self, Pharmacy Records  ALPRAZolam  (XANAX ) 0.25 MG tablet [id number]  Take 1 tablet (0.25 mg total) by mouth 2 (two) times daily as needed for  anxiety. TAKE 1-2 TABLETS BY MOUTH 2 TIMES DAILY AS NEEDED FOR ANXIETY OR INSOMNIA  Patient taking differently: Take 0.25 mg by mouth at bedtime.   Copland, [name], MD  Active Self, Pharmacy Records  apixaban  (ELIQUIS ) 5 MG TABS tablet [id number]  Take 1 tablet (5 mg total) by mouth 2 (two) times daily. Lynn [name], [place]  Active Self, Pharmacy Records  atorvastatin  (LIPITOR ) 80 MG tablet [id number]  Take 1 tablet (80 mg total) by mouth every evening. Lynn [name], [place]  Active Self, Pharmacy Records  azithromycin  (ZITHROMAX ) 500 MG tablet [id number]  Take 1 tablet (500 mg total) by mouth daily for 2 days. Cox, Amy N, DO  Active   cefdinir  (OMNICEF ) 300 MG capsule [id number]  Take 1 capsule (300 mg total) by mouth 2 (two) times daily for 2 days. Cox, Amy N, DO  Active   cetirizine (ZYRTEC) 10 MG tablet [id number]  Take 10 mg by mouth daily as needed for allergies. [provider]  Active Self, Pharmacy Records  Continuous Glucose Sensor (DEXCOM G7 SENSOR) [place] [id number]  1 Device by Does not apply route as directed. [name], Donell Cardinal, MD  Active Self, Pharmacy Records  cyanocobalamin  (VITAMIN B12) 1000 MCG tablet [id number]  Take 1 tablet (1,000 mcg total) by mouth daily. Lynn Yetta HERO, MD  Active Self, Pharmacy Records  diltiazem  (CARDIZEM  CD) 360 MG 24 hr capsule [id number]  Take 1 capsule (360 mg total) by mouth daily. Lynn Alm ORN, MD  Active Self, Pharmacy Records  diphenhydramine-acetaminophen  (TYLENOL  PM) 25-500 MG TABS tablet [id number]  Take 2 tablets by mouth at bedtime. [provider]  Active Self, Pharmacy Records  ferrous sulfate  325 (65 FE) MG  EC tablet [id number]  Take 1 tablet (325 mg total) by mouth daily with breakfast. Lynn Yetta HERO, MD  Active Self, Pharmacy Records  folic acid  (FOLVITE ) 800 MCG tablet [id number]  Take 800 mcg by mouth every other day. [provider]  Active Self, Pharmacy Records  furosemide  (LASIX ) 40 MG tablet [id number]  Take 80 mg  by mouth in the morning and an additional 40 mg for an overnight weight gain of 3 pounds or 5 pounds in a week Lynn Alm ORN, MD  Active Self, Pharmacy Records  gabapentin  (NEURONTIN ) 100 MG capsule [id number]  Take 2 capsules (200 mg total) by mouth at bedtime. Cox, Amy N, DO  Active   glucose blood (CONTOUR NEXT TEST) test strip [id number]  3x daily [name], [name], MD  Active Self, Pharmacy Records  guaiFENesin  [facility]) 100 MG/5ML liquid [id number]  Take 5 mLs by mouth every 4 (four) hours as needed for cough or to loosen phlegm. Cox, Amy N, DO  Active   hydroxychloroquine  (PLAQUENIL ) 200 MG tablet [id number]  Take 200 mg by mouth at bedtime. [provider]  Active Self, Pharmacy Records  Insulin  Disposable Pump (OMNIPOD 5 G7 PODS, GEN 5,) MISC [id number]  1 Device by Other route every other day. [name], Donell Cardinal, MD  Active Self, Pharmacy Records  insulin  lispro (HUMALOG ) 100 UNIT/ML injection [id number]  Max Daily 70 units per pump [name], [name], MD  Active Self, Pharmacy Records  ipratropium-albuterol  (DUONEB) 0.5-2.5 (3) MG/3ML SOLN [id number]  Take 3 mLs by nebulization 2 (two) times daily. Cox, Amy N, DO  Active   isosorbide  mononitrate (IMDUR ) 30 MG 24 hr tablet [id number]  Take 1 tablet (30 mg total) by mouth daily. Lynn [name], [place]  Active Self, Pharmacy Records  Lancets 30G [place] [id number]  1 Device by Does not apply route 3 (three) times daily. Copland, [name], MD  Active Self, Pharmacy Records  losartan  (COZAAR ) 100 MG tablet [id number]  Take 0.5 tablets (50 mg total) by mouth daily. Lynn Alm ORN, MD  Active Self, Pharmacy Records  melatonin 5 MG TABS [id number]  Take 20 mg by mouth at bedtime. [provider]  Active Self, Pharmacy Records  Multiple Vitamins-Minerals (HM MULTIVITAMIN ADULT GUMMY PO) [id number]  Take 2 each by mouth daily. [provider]  Active Self, Pharmacy Records  nitroGLYCERIN  (NITROSTAT ) 0.4 MG SL  tablet [id number]  Place 1 tablet (0.4 mg total) under the tongue every 5 (five) minutes as needed for chest pain. Goodrich, Callie E, PA-C  Active Self, Pharmacy Records  oxyCODONE  (OXY IR/ROXICODONE ) 5 MG immediate release tablet [id number]  Take 1 tablet (5 mg total) by mouth every 8 (eight) hours as needed for up to 3 days for moderate pain (pain score 4-6) or severe pain (pain score 7-10). CoxGreig SAILOR, DO  Active   OXYGEN  [id number] Yes Inhale 3 L/min into the lungs continuous. [provider]  Active   Oxymetazoline  HCl (VICKS SINEX NA) [id number]  Place 1 spray into both nostrils every 12 (twelve) hours as needed (for congestion- MAX OF THREE CONSECUTIVE DAYS). [provider]  Active Self, Pharmacy Records  pantoprazole  (PROTONIX ) 40 MG tablet [id number]  Take 1 tablet (40 mg total) by mouth daily before breakfast.  Patient taking differently: Take 40 mg by mouth daily as needed (indigestion).   Zehr, Jessica D, PA-C  Active Self, Pharmacy Records  spironolactone  (ALDACTONE ) 25 MG tablet [id number]  Take 1 tablet (25 mg total) by mouth daily. Lynn Alm  W, MD  Expired 09/06/24 2359 Self, Pharmacy Records  tirzepatide  (MOUNJARO ) 12.5 MG/0.5ML Pen [id number]  Inject 12.5 mg into the skin once a week. [provider]  Active Self, Pharmacy Records  tirzepatide  (MOUNJARO ) 15 MG/0.5ML Pen [id number]  Inject 15 mg into the skin once a week.  Patient not taking: Reported on 08/28/2024   [name], Donell Cardinal, MD  Active Self, Pharmacy Records  traZODone  (DESYREL ) 50 MG tablet [id number]  Take 0.5-1 tablets (25-50 mg total) by mouth at bedtime as needed for sleep. Needs appt  Patient taking differently: Take 50-100 mg by mouth at bedtime as needed for sleep. Needs appt   Copland, [name], MD  Active Self, Pharmacy Records            Home Care and Equipment/Supplies: Were Home Health Services Ordered?: Yes Name of Home Health Agency:: [facility] Has Agency set up  a time to come to your home?: No EMR reviewed for Home Health Orders: Orders present/patient has not received call (refer to CM for follow-up) Any new equipment or medical supplies ordered?: No  Functional Questionnaire: Do you need assistance with bathing/showering or dressing?: No Do you need assistance with meal preparation?: No Do you need assistance with eating?: No Do you have difficulty maintaining continence: No Do you need assistance with getting out of bed/getting out of a chair/moving?: No Do you have difficulty managing or taking your medications?: No  Follow up appointments reviewed: PCP Follow-up appointment confirmed?: Yes Date of PCP follow-up appointment?: 09/16/24 Follow-up Provider: Dr. [facility] Follow-up appointment confirmed?: Yes Date of Specialist follow-up appointment?: 09/24/24 Follow-Up Specialty Provider:: Pulmonology Do you need transportation to your follow-up appointment?: No Do you understand care options if your condition(s) worsen?: Yes-patient verbalized understanding  SDOH Interventions Today    Flowsheet Row Most Recent Value  SDOH Interventions   Food Insecurity Interventions Intervention Not Indicated  Housing Interventions Intervention Not Indicated  Transportation Interventions Intervention Not Indicated  Utilities Interventions Intervention Not Indicated    Medford Balboa, BSN, RN [place]  VBCI - [facility] RN Care Manager [phone]

## 2024-09-11 ENCOUNTER — Other Ambulatory Visit: Payer: Self-pay

## 2024-09-11 ENCOUNTER — Encounter (INDEPENDENT_AMBULATORY_CARE_PROVIDER_SITE_OTHER): Payer: Self-pay

## 2024-09-11 ENCOUNTER — Encounter (HOSPITAL_COMMUNITY): Payer: Self-pay

## 2024-09-11 ENCOUNTER — Emergency Department (HOSPITAL_COMMUNITY)
Admission: EM | Admit: 2024-09-11 | Discharge: 2024-09-11 | Disposition: A | Discharge status: Home / Self Care | Attending: Emergency Medicine | Admitting: Emergency Medicine

## 2024-09-11 DIAGNOSIS — R040 Epistaxis: Secondary | ICD-10-CM | POA: Diagnosis present

## 2024-09-11 DIAGNOSIS — Z7901 Long term (current) use of anticoagulants: Secondary | ICD-10-CM | POA: Insufficient documentation

## 2024-09-11 DIAGNOSIS — I110 Hypertensive heart disease with heart failure: Secondary | ICD-10-CM | POA: Insufficient documentation

## 2024-09-11 DIAGNOSIS — I509 Heart failure, unspecified: Secondary | ICD-10-CM | POA: Insufficient documentation

## 2024-09-11 DIAGNOSIS — Z79899 Other long term (current) drug therapy: Secondary | ICD-10-CM | POA: Insufficient documentation

## 2024-09-11 DIAGNOSIS — E119 Type 2 diabetes mellitus without complications: Secondary | ICD-10-CM | POA: Insufficient documentation

## 2024-09-11 DIAGNOSIS — Z794 Long term (current) use of insulin: Secondary | ICD-10-CM | POA: Insufficient documentation

## 2024-09-11 LAB — CULTURE, BLOOD (ROUTINE X 2)
Culture: NO GROWTH
Culture: NO GROWTH
Special Requests: ADEQUATE
Special Requests: ADEQUATE

## 2024-09-11 MED ORDER — SALINE SPRAY 0.65 % NA SOLN: 1.0000 | Freq: Two times a day (BID) | NASAL | 0 refills | Status: DC

## 2024-09-11 MED ORDER — OXYMETAZOLINE HCL 0.05 % NA SOLN: 1.0000 | Freq: Two times a day (BID) | NASAL | 0 refills | Status: AC | PRN

## 2024-09-11 NOTE — Discharge Instructions (Signed)
 You were seen in the emergency department for your nosebleed.  The bleeding was controlled once you were in the ER and you had no recurrent bleeding.  You should continue to use your humidifiers at home and I have also given you a saline nasal spray to use twice daily as well as using over-the-counter Vaseline to help prevent recurrent bleeds.  If you do have a recurrent bleed, you should first blow all the blood clots out of your nose, then you can spray 2 sprays of Afrin nasal spray in each nostril, then hold the soft part of your nose as hard as you can for at least 15 minutes.  You can follow-up with ENT for further management of your recurrent nosebleeds.  You should return to the emergency department if you are continuing to have bleeding despite holding pressure for at least 15 minutes, you are becoming lightheaded or pass out or if you have any other new or concerning symptoms.

## 2024-09-11 NOTE — ED Triage Notes (Signed)
 Pt arrived from home via [facility] c/o epistaxis that began at approx 0345. Pt states that she is on eliquis . EMS adm 2 spray of afrin each nostril

## 2024-09-11 NOTE — ED Provider Notes (Signed)
 [place] EMERGENCY DEPARTMENT AT [facility] Provider Note   CSN: [id number] Arrival date & time: 09/11/24  9354     Patient presents with: Epistaxis   [name] is a 68 y.o. female.   Patient is a 68 year old female with past medical history of COPD on home O2, A-fib on Eliquis , hypertension, diabetes, CAD, CHF presenting to the emergency department with epistaxis.  Patient states that she woke up this morning and blew her nose and developed a nosebleed.  She states that blood was coming out of both naris and she did feel some blood dripping in the back of her throat.  She states that she tilted her head forward and squeezed the soft part of her nose but was unable to stop the bleeding and called 911.  EMS did give her Afrin and placed cotton balls in bilateral nares and was able to control the bleeding.  The patient states that she does have a humidifier at home as well as a humidifier for her oxygen  which she has been using.  She denies any lightheadedness or dizziness.  The history is provided by the patient.  Epistaxis      Prior to Admission medications   Medication Sig Start Date End Date Taking? Authorizing Provider  oxymetazoline  (AFRIN NASAL SPRAY) 0.05 % nasal spray Place 1 spray into both nostrils 2 (two) times daily as needed for congestion (Nosebleed). 09/11/24  Yes Ellouise, Ioanna Colquhoun K, DO  sodium chloride  (OCEAN) 0.65 % SOLN nasal spray Place 1 spray into both nostrils in the morning and at bedtime. 09/11/24  Yes Ellouise, Ruhama Lehew K, DO  acetaminophen  (TYLENOL ) 500 MG tablet Take 2,000 mg by mouth daily at 12 noon.    [provider]  albuterol  (PROVENTIL ) (2.5 MG/3ML) 0.083% nebulizer solution INHALE 3 ML BY NEBULIZATION EVERY 6 HOURS AS NEEDED FOR WHEEZING OR SHORTNESS OF BREATH Patient taking differently: Take 2.5 mg by nebulization 2 (two) times daily. 03/30/24   Cobb, Comer GAILS, NP  albuterol  (VENTOLIN  HFA) 108 (90 Base) MCG/ACT  inhaler TAKE 2 PUFFS BY MOUTH EVERY 6 HOURS AS NEEDED FOR WHEEZE OR SHORTNESS OF BREATH 07/27/24   Geronimo Amel, MD  ALPRAZolam  (XANAX ) 0.25 MG tablet Take 1 tablet (0.25 mg total) by mouth 2 (two) times daily as needed for anxiety. TAKE 1-2 TABLETS BY MOUTH 2 TIMES DAILY AS NEEDED FOR ANXIETY OR INSOMNIA Patient taking differently: Take 0.25 mg by mouth at bedtime. 06/17/24   Copland, [name], MD  apixaban  (ELIQUIS ) 5 MG TABS tablet Take 1 tablet (5 mg total) by mouth 2 (two) times daily. 08/27/23   Meng, Hao, PA  atorvastatin  (LIPITOR ) 80 MG tablet Take 1 tablet (80 mg total) by mouth every evening. 08/27/23   Meng, Hao, PA  azithromycin  (ZITHROMAX ) 500 MG tablet Take 1 tablet (500 mg total) by mouth daily for 2 days. 09/09/24 09/11/24  Cox, Amy N, DO  cefdinir  (OMNICEF ) 300 MG capsule Take 1 capsule (300 mg total) by mouth 2 (two) times daily for 2 days. 09/09/24 09/11/24  Cox, Amy N, DO  cetirizine (ZYRTEC) 10 MG tablet Take 10 mg by mouth daily as needed for allergies.    [provider]  Continuous Glucose Sensor (DEXCOM G7 SENSOR) MISC 1 Device by Does not apply route as directed. 07/22/24   [name], [name], MD  cyanocobalamin  (VITAMIN B12) 1000 MCG tablet Take 1 tablet (1,000 mcg total) by mouth daily. 08/18/24   [name], MD  diltiazem  (CARDIZEM  CD)  360 MG 24 hr capsule Take 1 capsule (360 mg total) by mouth daily. 09/01/24   [name], MD  diphenhydramine-acetaminophen  (TYLENOL  PM) 25-500 MG TABS tablet Take 2 tablets by mouth at bedtime.    [provider]  ferrous sulfate  325 (65 FE) MG EC tablet Take 1 tablet (325 mg total) by mouth daily with breakfast. 08/18/24 08/18/25  [name], MD  folic acid  (FOLVITE ) 800 MCG tablet Take 800 mcg by mouth every other day.    [provider]  furosemide  (LASIX ) 40 MG tablet Take 80 mg by mouth in the morning and an additional 40 mg for an overnight weight gain of 3 pounds or 5 pounds in a  week 09/04/24   [name], MD  gabapentin  (NEURONTIN ) 100 MG capsule Take 2 capsules (200 mg total) by mouth at bedtime. 09/08/24   Cox, Amy N, DO  glucose blood (CONTOUR NEXT TEST) test strip 3x daily 07/20/19   [name], [name], MD  guaiFENesin  (ROBITUSSIN) 100 MG/5ML liquid Take 5 mLs by mouth every 4 (four) hours as needed for cough or to loosen phlegm. 09/08/24   Cox, Amy N, DO  hydroxychloroquine  (PLAQUENIL ) 200 MG tablet Take 200 mg by mouth at bedtime. 05/30/22   [provider]  Insulin  Disposable Pump (OMNIPOD 5 G7 PODS, GEN 5,) MISC 1 Device by Other route every other day. 07/22/24   [name], [name], MD  insulin  lispro (HUMALOG ) 100 UNIT/ML injection Max Daily 70 units per pump 07/22/24   [name], [name], MD  ipratropium-albuterol  (DUONEB) 0.5-2.5 (3) MG/3ML SOLN Take 3 mLs by nebulization 2 (two) times daily. 09/08/24   Cox, Amy N, DO  isosorbide  mononitrate (IMDUR ) 30 MG 24 hr tablet Take 1 tablet (30 mg total) by mouth daily. 08/27/23   Meng, Hao, PA  Lancets 30G MISC 1 Device by Does not apply route 3 (three) times daily. 07/16/19   Copland, [name], MD  losartan  (COZAAR ) 100 MG tablet Take 0.5 tablets (50 mg total) by mouth daily. 01/20/24   [name], MD  melatonin 5 MG TABS Take 20 mg by mouth at bedtime.    [provider]  Multiple Vitamins-Minerals (HM MULTIVITAMIN ADULT GUMMY PO) Take 2 each by mouth daily.    [provider]  nitroGLYCERIN  (NITROSTAT ) 0.4 MG SL tablet Place 1 tablet (0.4 mg total) under the tongue every 5 (five) minutes as needed for chest pain. 08/27/24 11/25/24  Goodrich, Callie E, PA-C  oxyCODONE  (OXY IR/ROXICODONE ) 5 MG immediate release tablet Take 1 tablet (5 mg total) by mouth every 8 (eight) hours as needed for up to 3 days for moderate pain (pain score 4-6) or severe pain (pain score 7-10). 09/08/24 09/11/24  Cox, Amy N, DO  OXYGEN  Inhale 3 L/min into the lungs continuous.     [provider]  Oxymetazoline  HCl (VICKS SINEX NA) Place 1 spray into both nostrils every 12 (twelve) hours as needed (for congestion- MAX OF THREE CONSECUTIVE DAYS).    [provider]  pantoprazole  (PROTONIX ) 40 MG tablet Take 1 tablet (40 mg total) by mouth daily before breakfast. Patient taking differently: Take 40 mg by mouth daily as needed (indigestion). 03/03/24   Zehr, Jessica D, PA-C  spironolactone  (ALDACTONE ) 25 MG tablet Take 1 tablet (25 mg total) by mouth daily. 06/08/24 09/06/24  [name], MD  tirzepatide  (MOUNJARO ) 12.5 MG/0.5ML Pen Inject 12.5 mg into the skin once a week.    [provider]  tirzepatide  (MOUNJARO ) 15 MG/0.5ML Pen Inject 15 mg into the skin once a week. Patient not taking: Reported on 08/28/2024 07/22/24   [name], [name], MD  traZODone  (DESYREL ) 50 MG tablet Take 0.5-1 tablets (25-50 mg total) by mouth at bedtime as needed for sleep. Needs appt Patient taking differently: Take 50-100 mg by mouth at bedtime as needed for sleep. Needs appt 06/17/24   Copland, [name], MD    Allergies: Methotrexate, Penicillins, Farxiga  [dapagliflozin ], and Metformin  and related    Review of Systems  HENT:  Positive for nosebleeds.     Updated Vital Signs BP 135/81   Pulse 91   Temp 98 F (36.7 C) (Oral)   Resp (!) 22   Ht 5' 3 (1.6 m)   Wt 115 kg   LMP  (LMP Unknown)   SpO2 90%   BMI 44.91 kg/m   Physical Exam Vitals and nursing note reviewed.  Constitutional:      General: She is not in acute distress.    Appearance: Normal appearance.  HENT:     Head: Normocephalic.     Nose:     Comments: Friable tissue in bilateral anterior kiesselbach's plexus. No active bleeding    Mouth/Throat:     Mouth: Mucous membranes are moist.     Pharynx: Oropharynx is clear.     Comments: No blood in posterior oropharynx Eyes:     Extraocular Movements: Extraocular movements intact.  Cardiovascular:     Rate and Rhythm: Normal  rate and regular rhythm.     Heart sounds: Normal heart sounds.  Pulmonary:     Effort: Pulmonary effort is normal.  Abdominal:     General: Abdomen is flat.  Musculoskeletal:        General: Normal range of motion.     Cervical back: Normal range of motion.  Skin:    General: Skin is warm and dry.  Neurological:     Mental Status: She is alert and oriented to person, place, and time.  Psychiatric:        Mood and Affect: Mood normal.        Behavior: Behavior normal.     (all labs ordered are listed, but only abnormal results are displayed) Labs Reviewed - No data to display  EKG: None  Radiology: No results found.   Procedures   Medications Ordered in the ED - No data to display  Clinical Course as of 09/11/24 0825  Fri Sep 11, 2024  0715 I removed cotton balls from bilateral nares. No active bleeding. Will closely monitor for re-bleeding. [VK]  0745 Patient reassessed. Still no bleeding, will continue to monitor for any recurrence.  [VK]  [id number] Patient has had no recurrent bleeding. She is stable for discharge home. Recommended to continue humidifier use as well as saline nasal spray and Vaseline to prevent recurrent bleedings and afrin as needed for bleed. Recommended ENT follow up and given strict return precautions. [VK]    Clinical Course User Index [VK] Kingsley, Dajae Kizer K, DO                                 Medical Decision Making This patient presents to the ED with chief complaint(s) of epistaxis with pertinent past medical history of COPD on 2L home O2, A-fib on Eliquis , hypertension, diabetes, CAD, CHF which further complicates the presenting complaint. The complaint involves an extensive differential diagnosis and also carries with  it a high risk of complications and morbidity.    The differential diagnosis includes anterior nosebleed, no current signs of posterior nosebleed, coagulopathy, no signs or symptoms of anemia  Additional history  obtained: Additional history obtained from EMS  Records reviewed previous admission documents, outpatient cardiology records  ED Course and Reassessment: On patient's arrival she is hemodynamically stable in no acute distress without any active bleeding.  She was given Afrin by EMS and had cotton balls placed in bilateral naris which were removed by myself.  She had no active bleeding at this time.  She will be closely monitored for any recurrent bleeding.  She has no signs or symptoms of acute anemia at this time although off on labs at this time.  Independent labs interpretation:  N/A  Independent visualization of imaging: - N/A  Consultation: - Consulted or discussed management/test interpretation w/ external professional: N/A  Consideration for admission or further workup: Patient has no emergent conditions requiring admission or further work-up at this time and is stable for discharge home with primary care follow-up  Social Determinants of health: N/A    Risk OTC drugs.       Final diagnoses:  Anterior epistaxis    ED Discharge Orders          Ordered    oxymetazoline  [facility] NASAL SPRAY) 0.05 % nasal spray  2 times daily PRN        09/11/24 0824    sodium chloride  (OCEAN) 0.65 % SOLN nasal spray  2 times daily        09/11/24 0824               Kingsley, Reshad Saab K, DO 09/11/24 0825

## 2024-09-13 NOTE — Progress Notes (Unsigned)
 [facility] [address], [address] [place], [place] [address] [phone]  Date:  09/16/2024   Name:  Lynn [name]   DOB:  20-Jul-1956   MRN:  [id number]  PCP:  Watt [name], MD    Chief Complaint: No chief complaint on file.   History of Present Illness:  Lynn [name] is a 68 y.o. very pleasant female patient who presents with the following:  Patient seen today for follow-up from recent hospitalization.  I saw her most recently on November 10.  Unfortunately in the interim she has been admitted to the hospital and seen in the ER.  She was able to get her heart catheterization which had been delayed due to anemia on 11/21.  She showed noncritical stenosis but also severe pulmonary hypertension  -history of paroxysmal atrial tach and fibrillation on diltiazem  and Eliquis , interstitial lung disease and COPD on oxygen , diabetes, history of CVA, rheumatoid arthritis, hypertension, thalassemia  She was admitted 11/22 through 11/25 with community-acquired pneumonia.  She was discharged home with azithromycin  and cefdinir  to complete a 5-day course She was then seen in the ER on 11/28 with a nosebleed  Discussed the use of AI scribe software for clinical note transcription with the patient, who gave verbal consent to proceed.  History of Present Illness     Patient Active Problem List   Diagnosis Date Noted   Chronic back pain 09/07/2024   Chest pain 09/06/2024   Elevated troponin 09/06/2024   CAP (community acquired pneumonia) 09/06/2024   Microcytic anemia 08/17/2024   History of epistaxis 08/17/2024   Atypical angina 08/14/2024   Pulmonary hypertension, unspecified (HCC) 08/14/2024   AVM (arteriovenous malformation) of small bowel, acquired with hemorrhage 07/18/2023   COPD (chronic obstructive pulmonary disease) (HCC) 07/16/2023   Obesity, Class III, BMI 40-49.9 (morbid obesity) (HCC) 07/16/2023    Dyslipidemia 07/15/2023   Acute upper GI bleed 09/04/2022   IDA (iron deficiency anemia) 02/09/2022   Acute on chronic respiratory failure with hypoxia (HCC) 11/14/2021   AKI (acute kidney injury)    Acute gastric ulcer without hemorrhage or perforation    Ulcer of esophagus without bleeding    PAF (paroxysmal atrial fibrillation) (HCC); CHA2DS2-VASc score 7 07/05/2021   Acute gastric ulcer with hemorrhage    ILD (interstitial lung disease) (HCC)    Shortness of breath 03/24/2021   Hypokalemia 03/24/2021   Coronary artery spasm 08/09/2020   Rheumatoid arthritis (HCC) 11/09/2019   Mobility impaired 04/01/2019   Class 3 obesity (HCC) 12/03/2017   Moderate mitral stenosis by prior echocardiography 08/23/2017   Chronic diastolic CHF (congestive heart failure) (HCC) 08/12/2017   Chronic respiratory failure with hypoxia (HCC) 08/12/2017   OSA on CPAP 08/12/2017   Near syncope 07/03/2017   Anxiety 07/03/2017   Coronary artery disease, non-occlusive 12/08/2016   PAT (paroxysmal atrial tachycardia)    H/O total knee replacement 08/19/2015   Thalassemia 09/02/2013   Type 2 diabetes mellitus with hyperlipidemia (HCC) 08/24/2012   Essential hypertension 07/22/2012    Past Medical History:  Diagnosis Date   Allergy 2018   Anxiety    CHF (congestive heart failure) (HCC)    Clotting disorder    blood clot in eye 2016   COPD (chronic obstructive pulmonary disease) (HCC)    Coronary artery disease, non-occlusive    a. 11/2016 NSTEMI/Cath: LM nl, LAD 40p, 50md, D1/2 small, LCX 40ost, OM2/3 nl/small, RCA 35 ost/mid, RPDA/RPL small/nl.  Coronary vasospasm    Depression    Diastolic dysfunction    a. 11/2016 Echo: EF 55-60%, gr1 DD, sev calcified MV annulus, mildly dil LA.   Eye hemorrhage 01/2015   right; resolved (07/03/2017)   Gastric ulcer    GERD (gastroesophageal reflux disease)    Headache    monthly (07/03/2017)   Heart murmur    History of blood transfusion    when I had  an ectopic pregnancy   History of stomach ulcers    Hyperlipidemia    Hypertension    Microcytic anemia    Moderate mitral stenosis by prior echocardiogram 03/2021   TTE: Mod MS (mean gradient 9 mmHg). -- TEE Moderate-Severe MS (mean gradient 14 mmHg) with fixed Post MV Leaflet & Severe MAC w/ large circumscribed calcified mass on the Post MV Leaflet. (Mass previously described in 2018) - CMRI identifies calcified mass & not Tumor.; R&LHC - LVEDP & PCWP both 23 mmHg indicating minimal resting gradient   Myocardial infarction (HCC) 2023   OSA on CPAP    setting is unknown   Oxygen  deficiency    PAF (paroxysmal atrial fibrillation) (HCC) 03/2021   Event Monitor Aug-Sept 2022: Predominantly sinus rhythm.  1% A. fib burden.  30 brief episodes of PAT.  2 short bursts of NSVT.   PAT (paroxysmal atrial tachycardia)    Event monitor from August 2022 showed 30 brief bursts longest 14 seconds.   Pneumonia    several times (07/03/2017)   PVC's (premature ventricular contractions)    Rheumatoid arthritis (HCC)    Sickle cell anemia (HCC)    Sleep apnea    Stroke [facility])    Syncope 07/03/2017 X 2   called seizures but no medications   Thalassemia    my cells are sickle cell shaped but I don't have sickle cell anemia (07/03/2017)   Type II diabetes mellitus (HCC)     Past Surgical History:  Procedure Laterality Date   48-Hour Monitor  03/18/2017   Sinus rhythm with sinus tachycardia (rate 58-134 BPM)multiple PVCs noted with couplets and bigeminy. One triplet. 7 runs of PAT ranging from 100-130 bpm. Longest was 33 beats.   BALLOON ENTEROSCOPY N/A 08/13/2023   Procedure: BALLOON ENTEROSCOPY;  Surgeon: San Sandor GAILS, DO;  Location: [facility];  Service: Gastroenterology;  Laterality: N/A;   BIOPSY  04/03/2021   Procedure: BIOPSY;  Surgeon: Leigh Elspeth SQUIBB, MD;  Location: [facility];  Service: Gastroenterology;;   BIOPSY  12/17/2022   Procedure: BIOPSY;  Surgeon: Abran Norleen SAILOR, MD;   Location: [facility];  Service: Gastroenterology;;   BREAST BIOPSY Left    BRONCHIAL WASHINGS  03/28/2021   Procedure: BRONCHIAL WASHINGS;  Surgeon: Gretta Leita SQUIBB, DO;  Location: [facility];  Service: Endoscopy;;   CARDIAC CATHETERIZATION N/A 11/19/2016   Procedure: Left Heart Cath and Coronary Angiography;  Surgeon: Victory LELON Sharps, MD;  Location: [facility] INVASIVE CV LAB;  Service: Cardiovascular.    LM nl, LAD 40p, 50md, D1/2 small, LCX 40ost, OM2/3 nl/small, RCA 35 ost/mid, RPDA/RPL small/nl.   CARDIAC MRI  03/30/2021   Normal LVEF ~53%. 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 No Scar or Late Gadolinium Enhancement on LV Myocardium.   CARPAL TUNNEL RELEASE Right 11/01/2014   COLONOSCOPY     DILATION AND CURETTAGE OF UTERUS     ECTOPIC PREGNANCY SURGERY  X 2   ENTEROSCOPY N/A 09/05/2022   Procedure: ENTEROSCOPY;  Surgeon: Wilhelmenia Aloha Raddle., MD;  Location: [facility];  Service: Gastroenterology;  Laterality: N/A;   ENTEROSCOPY N/A 07/17/2023   Procedure: ENTEROSCOPY;  Surgeon: Abran Norleen SAILOR, MD;  Location: [facility];  Service: Gastroenterology;  Laterality: N/A;   ESOPHAGOGASTRODUODENOSCOPY N/A 12/17/2022   Procedure: ESOPHAGOGASTRODUODENOSCOPY (EGD);  Surgeon: Abran Norleen SAILOR, MD;  Location: [facility];  Service: Gastroenterology;  Laterality: N/A;   ESOPHAGOGASTRODUODENOSCOPY (EGD) WITH PROPOFOL  N/A 04/03/2021   Procedure: ESOPHAGOGASTRODUODENOSCOPY (EGD) WITH PROPOFOL ;  Surgeon: Leigh Elspeth SQUIBB, MD;  Location: [facility];  Service: Gastroenterology;  Laterality: N/A;   ESOPHAGOGASTRODUODENOSCOPY (EGD) WITH PROPOFOL  N/A 08/15/2021   Procedure: ESOPHAGOGASTRODUODENOSCOPY (EGD) WITH PROPOFOL ;  Surgeon: Aneita Gwendlyn DASEN, MD;  Location: [facility];  Service: Endoscopy;  Laterality: N/A;   GIVENS CAPSULE STUDY N/A 07/17/2023   Procedure: GIVENS CAPSULE STUDY;  Surgeon: Abran Norleen SAILOR, MD;  Location: [facility];  Service:  Gastroenterology;  Laterality: N/A;   HOT HEMOSTASIS N/A 09/05/2022   Procedure: HOT HEMOSTASIS (ARGON PLASMA COAGULATION/BICAP);  Surgeon: Wilhelmenia Aloha Raddle., MD;  Location: [facility];  Service: Gastroenterology;  Laterality: N/A;   HOT HEMOSTASIS N/A 08/13/2023   Procedure: HOT HEMOSTASIS (ARGON PLASMA COAGULATION/BICAP);  Surgeon: San Sandor GAILS, DO;  Location: [facility];  Service: Gastroenterology;  Laterality: N/A;   JOINT REPLACEMENT     KNEE ARTHROSCOPY Left 11/2014   RIGHT/LEFT HEART CATH AND CORONARY ANGIOGRAPHY N/A 08/08/2021   Procedure: RIGHT/LEFT HEART CATH AND CORONARY ANGIOGRAPHY;  Surgeon: [name], MD;  Location: [facility] INVASIVE CV LAB;  Service: Cardiovascular;Ost LCx 40%. Ost-Prox RCA 35%. Prox-Mid LAD 25%-<25% D2. Mod-Severely Elevated LVEDP. Mild PA HTN -> PA mean 31 mmHg with a PCWP and LVEDP of 23 mmHg   RIGHT/LEFT HEART CATH AND CORONARY ANGIOGRAPHY N/A 09/04/2024   Procedure: RIGHT/LEFT HEART CATH AND CORONARY ANGIOGRAPHY;  Surgeon: [name], MD;  Location: [facility] INVASIVE CV LAB;  Service: Cardiovascular;  Laterality: N/A;   SUBMUCOSAL TATTOO INJECTION  09/05/2022   Procedure: SUBMUCOSAL TATTOO INJECTION;  Surgeon: Wilhelmenia Aloha Raddle., MD;  Location: [facility];  Service: Gastroenterology;;   SUBMUCOSAL TATTOO INJECTION  07/17/2023   Procedure: SUBMUCOSAL TATTOO INJECTION;  Surgeon: Abran Norleen SAILOR, MD;  Location: [facility];  Service: Gastroenterology;;   TEE WITHOUT CARDIOVERSION N/A 03/28/2021   Procedure: TRANSESOPHAGEAL ECHOCARDIOGRAM (TEE);  Surgeon: Alveta Aleene PARAS, MD;  Location: [facility] ENDOSCOPY;; LVEF 55-60%. Normal LV Fxn & no RWMA. No LAA thrombus. Gr II Ao Plaque. Large circumscribed calcific mass (1.8 x 1.4 cm) anterior to posterior annulus.  Fixed posterior leaflet with Mod-Severe MS (Mean MVG ~14 mmHg, Peak 20 mmhg).  Mild MR   TONSILLECTOMY     TOTAL KNEE ARTHROPLASTY Right 02/18/2015   Procedure: RIGHT TOTAL KNEE ARTHROPLASTY;   Surgeon: Marcey Her, MD;  Location: [facility] OR;  Service: Orthopedics;  Laterality: Right;   TOTAL KNEE ARTHROPLASTY Left 08/19/2015   Procedure: LEFT TOTAL KNEE ARTHROPLASTY;  Surgeon: Marcey Her, MD;  Location: [facility] OR;  Service: Orthopedics;  Laterality: Left;   TRANSTHORACIC ECHOCARDIOGRAM  11/2016; 07/2017:   a) normal LV size, thickness and function. EF 55-60%. GR 1 DD.No RWMA severe mitral annular calcification, but no mitral stenosis. Mild LA dilation;; b) normal LV size and function.  EF 65-75%.  Unable to assess diastolic function.  Aortic sclerosis with no stenosis.  Moderate mitral stenosis? (Gradient 9 mmHg) -?  Mild-mod left atrial enlargement    TRANSTHORACIC ECHOCARDIOGRAM  01/2019   EF 65 to 70%.  Normal function.  Elevated LVEDP-GR 1 DD.  Normal RV size and function.  Large calcific mass on posterior mitral leaflet mild to moderate mitral stenosis.   TRANSTHORACIC ECHOCARDIOGRAM  03/24/2021   EF 60 to 65%.  LV function with normal wall motion.  Moderate basal septal LVH.  GRII DD & Mild LA dilation.-> elevated LVEDP.  Normal RV function.  Mildly elevated PAP.  Ill-defined density measuring 3.23 x 2.1 cm region of the posterior MV leaflet along with dense MAC.  Moderate MS (mean MVG of 9 mmHg.) Normal AoV & RAP. --> Recommend TEE 2/2 concern for MV SBE.   TRANSTHORACIC ECHOCARDIOGRAM  04/26/2022   EF 60 to 65%.  No RWMA.  Moderate LVH with GR 1 DD.  Normal RV size and function.  Mild LA dilation.  Large calcific mass of posterior MV leaflet.  Mean PG 13 mm 3.  Trivial MR.  Mild to moderate MS.  Severe MAC.  Normal AOV.  Normal RAP.   VAGINAL HYSTERECTOMY     VIDEO BRONCHOSCOPY N/A 03/28/2021   Procedure: VIDEO BRONCHOSCOPY WITHOUT FLUORO;  Surgeon: Gretta Leita SQUIBB, DO;  Location: [facility] ENDOSCOPY;  Service: Endoscopy;  Laterality: N/A;    Social History   Tobacco Use   Smoking status: Former    Current packs/day: 0.00    Average packs/day: 0.3 packs/day for 44.0 years (11.0 ttl pk-yrs)     Types: Cigarettes    Start date: 02/17/1971    Quit date: 02/17/2015    Years since quitting: 9.5   Smokeless tobacco: Never  Vaping Use   Vaping status: Never Used  Substance Use Topics   Alcohol use: Not Currently    Comment: 07/03/2017 might have a few drinks/year   Drug use: No    Family History  Problem Relation Age of Onset   Cancer Mother    Diabetes Mother    Hypertension Mother    Hyperlipidemia Mother    Cancer Father    Hyperlipidemia Father    Mental illness Sister    Diabetes Sister    Diabetes Maternal Grandmother    Diabetes Maternal Grandfather    Colon cancer Neg Hx    Esophageal cancer Neg Hx    Stomach cancer Neg Hx    Rectal cancer Neg Hx    Tremor Neg Hx    Parkinson's disease Neg Hx     Allergies  Allergen Reactions   Methotrexate Other (See Comments)    Pneumonitis   Penicillins Hives    Childhood allergy    Farxiga  [Dapagliflozin ] Other (See Comments)    Dizzy and lethargic    Metformin  And Related Diarrhea and Other (See Comments)    Also, bleeding    Medication list has been reviewed and updated.  Current Outpatient Medications on File Prior to Visit  Medication Sig Dispense Refill   acetaminophen  (TYLENOL ) 500 MG tablet Take 2,000 mg by mouth daily at 12 noon.     albuterol  (PROVENTIL ) (2.5 MG/3ML) 0.083% nebulizer solution INHALE 3 ML BY NEBULIZATION EVERY 6 HOURS AS NEEDED FOR WHEEZING OR SHORTNESS OF BREATH (Patient taking differently: Take 2.5 mg by nebulization 2 (two) times daily.) 150 mL 7   albuterol  (VENTOLIN  HFA) 108 (90 Base) MCG/ACT inhaler TAKE 2 PUFFS BY MOUTH EVERY 6 HOURS AS NEEDED FOR WHEEZE OR SHORTNESS OF BREATH 8.5 each 2   ALPRAZolam  (XANAX ) 0.25 MG tablet Take 1 tablet (0.25 mg total) by mouth 2 (two) times daily as needed for anxiety. TAKE 1-2 TABLETS BY MOUTH 2 TIMES DAILY AS NEEDED FOR ANXIETY OR INSOMNIA (Patient taking differently: Take 0.25  mg by mouth at bedtime.) 60 tablet 1   apixaban  (ELIQUIS ) 5 MG TABS  tablet Take 1 tablet (5 mg total) by mouth 2 (two) times daily. 180 tablet 3   atorvastatin  (LIPITOR ) 80 MG tablet Take 1 tablet (80 mg total) by mouth every evening. 90 tablet 3   cetirizine (ZYRTEC) 10 MG tablet Take 10 mg by mouth daily as needed for allergies.     Continuous Glucose Sensor (DEXCOM G7 SENSOR) MISC 1 Device by Does not apply route as directed. 9 each 3   cyanocobalamin  (VITAMIN B12) 1000 MCG tablet Take 1 tablet (1,000 mcg total) by mouth daily. 30 tablet 0   diltiazem  (CARDIZEM  CD) 360 MG 24 hr capsule Take 1 capsule (360 mg total) by mouth daily. 90 capsule 3   diphenhydramine-acetaminophen  (TYLENOL  PM) 25-500 MG TABS tablet Take 2 tablets by mouth at bedtime.     ferrous sulfate  325 (65 FE) MG EC tablet Take 1 tablet (325 mg total) by mouth daily with breakfast. 60 tablet 3   folic acid  (FOLVITE ) 800 MCG tablet Take 800 mcg by mouth every other day.     furosemide  (LASIX ) 40 MG tablet Take 80 mg by mouth in the morning and an additional 40 mg for an overnight weight gain of 3 pounds or 5 pounds in a week 135 tablet 3   gabapentin  (NEURONTIN ) 100 MG capsule Take 2 capsules (200 mg total) by mouth at bedtime. 60 capsule 0   glucose blood (CONTOUR NEXT TEST) test strip 3x daily 300 each 11   guaiFENesin  (ROBITUSSIN) 100 MG/5ML liquid Take 5 mLs by mouth every 4 (four) hours as needed for cough or to loosen phlegm. 120 mL 0   hydroxychloroquine  (PLAQUENIL ) 200 MG tablet Take 200 mg by mouth at bedtime.     Insulin  Disposable Pump (OMNIPOD 5 G7 PODS, GEN 5,) MISC 1 Device by Other route every other day. 45 each 3   insulin  lispro (HUMALOG ) 100 UNIT/ML injection Max Daily 70 units per pump 70 mL 3   ipratropium-albuterol  (DUONEB) 0.5-2.5 (3) MG/3ML SOLN Take 3 mLs by nebulization 2 (two) times daily. 360 mL 0   isosorbide  mononitrate (IMDUR ) 30 MG 24 hr tablet Take 1 tablet (30 mg total) by mouth daily. 90 tablet 3   Lancets 30G MISC 1 Device by Does not apply route 3 (three) times  daily. 300 each 9   losartan  (COZAAR ) 100 MG tablet Take 0.5 tablets (50 mg total) by mouth daily. 45 tablet 3   melatonin 5 MG TABS Take 20 mg by mouth at bedtime.     Multiple Vitamins-Minerals (HM MULTIVITAMIN ADULT GUMMY PO) Take 2 each by mouth daily.     nitroGLYCERIN  (NITROSTAT ) 0.4 MG SL tablet Place 1 tablet (0.4 mg total) under the tongue every 5 (five) minutes as needed for chest pain. 25 tablet 3   OXYGEN  Inhale 3 L/min into the lungs continuous.     oxymetazoline  (AFRIN NASAL SPRAY) 0.05 % nasal spray Place 1 spray into both nostrils 2 (two) times daily as needed for congestion (Nosebleed). 30 mL 0   Oxymetazoline  HCl (VICKS SINEX NA) Place 1 spray into both nostrils every 12 (twelve) hours as needed (for congestion- MAX OF THREE CONSECUTIVE DAYS).     pantoprazole  (PROTONIX ) 40 MG tablet Take 1 tablet (40 mg total) by mouth daily before breakfast. (Patient taking differently: Take 40 mg by mouth daily as needed (indigestion).) 90 tablet 3   sodium chloride  (OCEAN) 0.65 % SOLN nasal  spray Place 1 spray into both nostrils in the morning and at bedtime. 15 mL 0   spironolactone  (ALDACTONE ) 25 MG tablet Take 1 tablet (25 mg total) by mouth daily. 90 tablet 3   tirzepatide  (MOUNJARO ) 12.5 MG/0.5ML Pen Inject 12.5 mg into the skin once a week.     tirzepatide  (MOUNJARO ) 15 MG/0.5ML Pen Inject 15 mg into the skin once a week. (Patient not taking: Reported on 08/28/2024) 6 mL 3   traZODone  (DESYREL ) 50 MG tablet Take 0.5-1 tablets (25-50 mg total) by mouth at bedtime as needed for sleep. Needs appt (Patient taking differently: Take 50-100 mg by mouth at bedtime as needed for sleep. Needs appt) 90 tablet 3   No current facility-administered medications on file prior to visit.    Review of Systems:  As per HPI- otherwise negative.   Physical Examination: There were no vitals filed for this visit. There were no vitals filed for this visit. There is no height or weight on file to  calculate BMI. Ideal Body Weight:    GEN: no acute distress. HEENT: Atraumatic, Normocephalic.  Ears and Nose: No external deformity. CV: RRR, No M/G/R. No JVD. No thrill. No extra heart sounds. PULM: CTA B, no wheezes, crackles, rhonchi. No retractions. No resp. distress. No accessory muscle use. ABD: S, NT, ND, +BS. No rebound. No HSM. EXTR: No c/c/e PSYCH: Normally interactive. Conversant.    Assessment and Plan: No diagnosis found.  Assessment & Plan   Signed Harlene Schroeder, MD

## 2024-09-14 ENCOUNTER — Telehealth: Payer: Self-pay

## 2024-09-14 NOTE — Telephone Encounter (Signed)
Called and gave VO 

## 2024-09-14 NOTE — Telephone Encounter (Signed)
 Copied from CRM [id number]. Topic: Clinical - Home Health Verbal Orders >> Sep 14, 2024 11:31 AM Montie POUR wrote: Caller/Agency: Sonny with [facility] Callback Number: Secured Voicemail [phone] Service Requested: Physical Therapy Frequency: 1 time for 1 week; 2 times for 1 week; 1 time for 4 weeks Any new concerns about the patient? No

## 2024-09-16 ENCOUNTER — Other Ambulatory Visit: Payer: Self-pay | Admitting: Family

## 2024-09-16 ENCOUNTER — Encounter: Payer: Self-pay | Admitting: Family Medicine

## 2024-09-16 ENCOUNTER — Telehealth: Payer: Self-pay

## 2024-09-16 ENCOUNTER — Ambulatory Visit: Admitting: Family Medicine

## 2024-09-16 VITALS — BP 136/78 | HR 70 | Ht 63.0 in | Wt 256.0 lb

## 2024-09-16 DIAGNOSIS — R040 Epistaxis: Secondary | ICD-10-CM | POA: Diagnosis not present

## 2024-09-16 DIAGNOSIS — D649 Anemia, unspecified: Secondary | ICD-10-CM

## 2024-09-16 DIAGNOSIS — R32 Unspecified urinary incontinence: Secondary | ICD-10-CM

## 2024-09-16 DIAGNOSIS — D509 Iron deficiency anemia, unspecified: Secondary | ICD-10-CM

## 2024-09-16 LAB — CBC
HCT: 23.7 % — CL (ref 36.0–46.0)
Hemoglobin: 7.2 g/dL — CL (ref 12.0–15.0)
MCHC: 30.4 g/dL (ref 30.0–36.0)
MCV: 64.1 fl — ABNORMAL LOW (ref 78.0–100.0)
Platelets: 301 K/uL (ref 150.0–400.0)
RBC: 3.7 Mil/uL — ABNORMAL LOW (ref 3.87–5.11)
RDW: 20.4 % — ABNORMAL HIGH (ref 11.5–15.5)
WBC: 8.1 K/uL (ref 4.0–10.5)

## 2024-09-16 LAB — FERRITIN: Ferritin: 23.4 ng/mL (ref 10.0–291.0)

## 2024-09-16 MED ORDER — INCONTINENCE SUPPLY DISPOSABLE MISC: 4 refills | Status: AC

## 2024-09-16 NOTE — Patient Instructions (Signed)
 I will check your hemoglobin stat today and we will deal with the results

## 2024-09-16 NOTE — Telephone Encounter (Signed)
 CRITICAL VALUE STICKER  CRITICAL VALUE: Hemoglobin 7.2 and Hematocrit 23.7   MD NOTIFIED: Dr. Watt is aware and has spoke with pt.

## 2024-09-16 NOTE — Progress Notes (Unsigned)
 Cardiology Office Note:    Date:  09/22/2024   ID:  Lynn [name], DOB 29-Oct-1955, MRN [id number]  PCP:  Watt [name], MD  Cardiologist:  Alm Clay, MD Cardiology APP:  Madie Jon Garre, PA     Referring MD: Watt [name], MD   Chief Complaint: follow-up of R/ LHC  History of Present Illness:    Lynn [name] is a 68 y.o. female with a history of mild non-obstructive CAD on cardiac catheterization in 07/2021 and 08/2024, coronary vasospasms, chronic diastolic CHF, paroxysmal atrial fibrillation on Eliquis , paroxysmal atrial tachycardia, moderate mitral stenosis, interstitial lung disease with chronic hypoxic respiratory failure, hypertension, hyperlipidemia, type 2 diabetes mellitus, CVA, GERD, recurrent GI bleeding from gastric ulcer and AVMs s/p multiple transfusions in the past, microcytic anemia due to thalassemia, obstructive sleep apnea, rheumatoid arthritis, and anxiety/ depression who is followed by Dr. Clay and presents today for follow-up of R/ LHC.  Patient has a complex history as detailed above and has had numerous hospitalizations over the years for a variety of issues. She was admitted for chest pain in 11/2016 and ruled in for NSTEMI. LHC showed non-obstructive CAD but she was started on Imdur  for possible coronary vasospasms. During an admission in 03/2021, TTE showed LVEF of 60-65% with moderate LVH of the basal-septal segment and grade 2 diastolic dysfunction, normal RV function, moderate mitral stenosis (mean gradient 9 mmHg), and an ill defined density originating off the posterior mitral valve leaflet. This led to a TEE which showed a large circumscribed mass adherent to the posterior annulus of the mitral valve with moderate to severe mitral stenosis. Cardiac MRI was ordered for further evaluation and showed normal LV function with posterolateral mitral annulus mass consistent with degenerative mitral annular calcification. Monitor in  05/2021 showed 1% atrial fibrillation (with the longest episode lasting about 1 hour), 30 brief episodes of SVT/ atrial tachycardia, and 2 short runs of NSVT.  R/ LHC in 07/2021 showed mild non-obstructive CAD, mild pulmonary venous hypertension (PA 31 mmHg with a PCWP and LVEDP of 23 mmHg), and preserved cardiac output. Patient was admitted in 10/2021 for syncope. Echo showed LVEF of 60-65% with severe LVH and grade 1 diastolic dysfunction, normal RV function, severe MAC with moderate to severe mitral stenosis (mean gradient 7.5 mmHg). Syncope was felt to likely be vasovagal in nature. Outpatient monitor showed 2 short runs of NSVT (longest run 12 beat), 43 atrial runs (longest run 13 beats), frequent PACs (8.4%), and rare PVCs.    She was referred to [facility] CT Surgery in 01/2024 for further evaluation of mitral valve disease. Echo there showed LVEF of >55 (3D EF 69%) with grade 2 diastolic dysfunction, normal RV function, and severe MAC with moderate mitral stenosis (mean gradient 11 mmHg). Coronary CTA showed moderate CAD with most significant lesions being a 70% stenosis in the proximal RCA and >70% stenosis of D2 and then 50% stenosis of mid LAD and proximal LCX as well as an enlarged left atrium with extensive nodular MAC and small size mitral annulus. Dr. Laurence recommended continue medical management for now as surgery would be considered high risk given her her multiple comorbidities. However, Dr. Laurence would consider mitral valve surgical replacement in the future if unable to manage worsening CHF symptoms.    Patient was last seen by Dr. Clay on 08/14/2024 at which time she reported chest pain and shortness of breath, particularly with exertion. She described the chest pain as as a heart pain  located on the left side accompanied by palpitations and fluttering. R/ LHC was recommended with plans to measure coronary flow reserve to assess for small vessel disease. This was initially scheduled for 08/19/2024.  However, pre-procedural  labs showed worsening anemia with hemoglobin of 7.6. She was admitted for worsening edema. Hemoccult was negative and anemia was felt to be due to recurrent epistaxis. She was treated with 1 unit of PRBCs and was advised to follow up with ENT.   She was last seen by me on 08/27/2024 and reported continued intermittent chest pain that she described as a heavy ache on a daily basis.  Hemoglobin was stable and she denied any recurrent epistasis so R/ LHC was rearranged.  This was done on 09/04/2024 and showed mild nonobstructive CAD and hemodynamic findings consistent with severe mixed pulmonary hypertension with mean PAP of 48 mmHg, PCWP of 29 mmHg, PVR 2.88, LVEDP of 21 mmHg as well as mild aortic stenosis and moderate mitral stenosis. She was given a dose of IV Lasix  in short stay and home PO Lasix  was increased.  She was subsequently admitted from 09/05/2024 to 09/08/2024 for community-acquired pneumonia after presenting for chest pain.  Echo showed LVEF of 70-75% with moderate LVH, mildly enlarged RV with normal RV function, and severe MAC with severe mitral stenosis (mean gradient of 16 mmHg with average heart rate of 84 bpm).  She was treated with antibiotics.  She was seen in the ED on 09/11/2024 for recurrent epistasis.  EMS gave aspirin  and place cotton balls in bilateral nares.  By the time she arrived to the ED, bleeding is stopped so she was felt to be stable for discharge.  Patient presents today for follow-up. Overall, she is feeling better from a cardiac standpoint since last visit. She is still having episodes of chest heaviness but they are not as severe and are not occurring as frequently. No longer happening on a daily basis. She estimates she is having this about 3  days a week. She states her breathing is pretty good; although, she is now requiring 4L of supplemental O2 via nasal cannula since being discharged with pneumonia (previously on 3L). She denies any new  or worsening orthopnea. No PND or edema. She is weighing herself daily at home and weight is down about 3 lbs. She reports some palpitations recently when she is walking and states her heart rate will increase to as high as the 140s. No palpitations outside of this. She report random very brief episodes of dizziness that only last for a couple seconds at a time. This does not seem to be related to position changes. She describes an episode of syncope that occurred prior to recent admission for pneumonia. She was having chest pain that wrapped around to her back and down her arm and leg. She took a dose of Nitroglycerin  and then had a syncopal episodes. She was feeling lightheaded/ dizzy prior to this. Her husband said she was foaming at the mouth. EMS was called and initial BP was 89/59. Suspect this was due to the Nitro.  She has had a couple other episodes of epistasis since her most recent ED visit on 09/11/2024 but they have not been as severe. Her PCP rechecked a CBC on 09/16/2024 which showed hemoglobin had dropped back down to 7.2. She had another transfusion on 09/18/2024 and is scheduled to follow back up with Hem-Onc tomorrow. She is also scheduled to see ENT later this month.   EKGs/Labs/Other Studies Reviewed:  The following studies were reviewed: TEE 03/28/2021: Impressions: 1. Left ventricular ejection fraction, by estimation, is 55 to 60%. The  left ventricle has normal function. The left ventricle has no regional  wall motion abnormalities.   2. Right ventricular systolic function was not well visualized. The right  ventricular size is not well visualized.   3. No left atrial/left atrial appendage thrombus was detected.   4. 3-D images of the mitral annular mass were obtained. There is a large  circumscribed mass adherent to the posterior annulus. The posterior  leaflet is fixed . There is moderate - severe mitral stenosis.      The mass is large , measuring 1.8 x 1.4 cm.  Differential diagnosis  includes myxoma, exuberant mitral annular calcification, myxoma, or other  primary cardiac tumor. suggest Cardiac MRI for further evaluation . This  mass was present on transthoracic  echo from 2018. . The mitral valve is abnormal. Mild mitral valve  regurgitation. Moderate to severe mitral stenosis. Severe mitral annular  calcification.   5. The aortic valve is tricuspid. Aortic valve regurgitation is not  visualized. No aortic stenosis is present.   6. There is mild (Grade II) plaque.  _______________   Cardiac MRI 03/30/2021: Impressions: 1.  Normal right and left ventricular size and function, LVEF 53%. 2. Posterolateral mitral annulus mass consistent with degenerative mitral annular calcification (hypointense on T1, T2 and STIR sequences, no LGE). 3. I reviewed today's High resolution chest CT which confirmed calcification of mitral annular mass as above. 4.  No scar or late gadolinium enhancement noted on LV myocardium. _______________   Monitor 05/22/2021 to 06/12/2021: 2 separate monitors worn for total of 21 days. Overall predominantly sinus rhythm with minimum heart rate of 55 bpm, maximum 140 bpm and average of 81 bpm. Atrial fibrillation was seen with a 1% burden, longest duration was 1 hour 5 minutes. Heart rate ranged from 53 to 117 bpm. 30 episodes of brief supraventricular tachycardia/atrial tachycardia: Fastest heart rate 156 bpm 6 beats. Longest was 14 seconds with an average heart rate of 115 bpm. 2 Short Episodes of Nonsustained Ventricular Tachycardia longest and fastest run was average 140 bpm with a high of 167 bpm For 7 minutes   Study does show several different rhythm issues.  Short bursts of nonsustained ventricular tachycardia are probably benign, but warrant further evaluation.  The only sustained arrhythmia was atrial fibrillation lasting for about an hour and 5 minutes. _______________  Monitor 11/19/2021 to 12/02/2021: Predominant  rhythm is sinus with a rate range of 57 to 123 bpm. Average 77 bpm. 2 short runs of NSVT (PVCs): Fastest 5 beats 169 bpm, longest 12 beats 130 bpm. 43 atrial runs: Fastest 5 beats rate 176 bpm, longest 13 beats with a rate of 93 bpm. Frequent isolated PACs (8.4%) with rare couplets and triplets. Rare isolated PVCs with rare couplets. No sustained arrhythmias: No evidence of atrial fibrillation or atrial flutter. No true SVT. No sustained VT. No significant bradycardia or pauses. Previously posttermination pauses noted after atrial and ventricular runs.   No significant arrhythmias to explain syncope. _______________  Right/ Left Cardiac Catheterization 09/04/2024:   Prox LAD to Mid LAD lesion is 25% stenosed with 25% stenosed side branch in 2nd Diag.  Mid LAD to Dist LAD lesion is 30% stenosed.   Ost Cx lesion is 40% stenosed.   Ost RCA to Prox RCA lesion is 40% stenosed. Prox RCA to Mid RCA lesion is 20% stenosed.   Hemodynamic findings  consistent with severe pulmonary hypertension.   There is mild aortic valve stenosis.   There is moderate mitral valve stenosis.   Dominance: Right  Post-Operative Diagnosis:  Angiographically minimal CAD with calcified minimal RCA disease over most 40% proximally, 30% eccentric LAD D1 and 20% D2 otherwise no CAD Severe Mixed Pulmonary Hypertension: Transpulmonary gradient near 20 with mean PAP 48 mmHg, PCWP 29 Miller mercury and LVEDP of 21 mmHg. => This is consistent with intrinsic pulmonary hypertension, LV diastolic dysfunction and mitral stenosis. RAP mean 19 mm; RVP-EDP 70/11-19 mmHg; PAP-mean 69/30-48 mm Q; PCWP 29-30 mmHg. PVR 2.88. LVP-EDP 133/10 -21 mmHg; AOP-MAP 130/64-92 mmHg. Ao sat 91%, PA sat 55%.  Normal Fick Cardiac Output-Index: 6.6-3.09   Plan:   In the absence of any other complications or medical issues, we expect the patient to be ready for discharge from a cath perspective on 09/04/2024.   Plan is to titrate up her  diuretic-increased from 40 mg in the morning to 80 mg.  (80 mg IV given in the PACU holding area); Continue to titrate GDMT for afterload reduction/treatment of diastolic heart failure. Continue to counsel the patient about decreasing salt intake. Continue home oxygen    Recommend Aspirin  81mg  daily for moderate CAD. _______________  Echocardiogram 09/06/2024: Impressions:  1. Left ventricular ejection fraction, by estimation, is 70 to 75%. The  left ventricle has hyperdynamic function. The left ventricle has no  regional wall motion abnormalities. There is moderate left ventricular  hypertrophy. Left ventricular diastolic  parameters are indeterminate.   2. Right ventricular systolic function is normal. The right ventricular  size is mildly enlarged. There is normal pulmonary artery systolic  pressure. The estimated right ventricular systolic pressure is 23.8 mmHg.   3. Left atrial size was moderately dilated.   4. Exuberant, likely caseating, mitral annular calcifications, seen on  prior echos. The mitral valve is degenerative. Trivial mitral valve  regurgitation. Severe mitral stenosis. The mean mitral valve gradient is  16.0 mmHg with average heart rate of 84  bpm. Severe mitral annular calcification.   5. The aortic valve is grossly normal. There is mild calcification of the  aortic valve. Aortic valve regurgitation is not visualized. Aortic valve  sclerosis is present, with no evidence of aortic valve stenosis. Aortic  valve mean gradient measures 8.0  mmHg.   6. The inferior vena cava is normal in size with greater than 50%  respiratory variability, suggesting right atrial pressure of 3 mmHg.    EKG:  EKG not ordered today.   Recent Labs: 09/05/2024: ALT 14 09/06/2024: B Natriuretic Peptide 78.8 09/08/2024: BUN 19; Creatinine, Ser 1.03; Potassium 4.3; Sodium 138 09/16/2024: Hemoglobin 7.2 Repeated and verified X2.; Platelets 301.0  Recent Lipid Panel    Component Value  Date/Time   CHOL 118 06/17/2024 1412   TRIG 69.0 06/17/2024 1412   HDL 58.10 06/17/2024 1412   CHOLHDL 2 06/17/2024 1412   VLDL 13.8 06/17/2024 1412   LDLCALC 46 06/17/2024 1412   LDLDIRECT 131 (H) 09/02/2013 1004    Physical Exam:    Vital Signs: BP 134/60   Pulse 63   Ht 5' 3 (1.6 m)   Wt 254 lb (115.2 kg)   LMP  (LMP Unknown)   SpO2 94%   BMI 44.99 kg/m     Wt Readings from Last 3 Encounters:  09/22/24 254 lb (115.2 kg)  09/16/24 256 lb (116.1 kg)  09/11/24 253 lb 8.5 oz (115 kg)     General: 68 y.o. obese  African-American female in no acute distress. HEENT: Normocephalic and atraumatic. Sclera clear.  Neck: Supple. No carotid bruits. No JVD. Heart: RRR. Distinct S1 and S2. No murmurs, gallops, or rubs.  Lungs: No increased work of breathing. Clear to ausculation bilaterally. No wheezes, rhonchi, or rales.  Extremities: No lower extremity edema.   Skin: Warm and dry. Neuro: No focal deficits. Psych: Normal affect. Responds appropriately.  Assessment:    1. Chest pain of uncertain etiology   2. Coronary artery disease involving native coronary artery of native heart with angina pectoris   3. Chronic heart failure with preserved ejection fraction (HFpEF) (HCC)   4. Paroxysmal atrial fibrillation (HCC)   5. Paroxysmal atrial tachycardia   6. Mitral valve stenosis, unspecified etiology   7. Syncope, unspecified syncope type   8. Primary hypertension   9. Hyperlipidemia, unspecified hyperlipidemia type   10. Type 2 diabetes mellitus with complication, with long-term current use of insulin  (HCC)   11. Interstitial lung disease (HCC)   12. Chronic hypoxic respiratory failure (HCC)   13. Obstructive sleep apnea   14. Chronic anemia   15. Thalassemia, unspecified type   16. Epistaxis     Plan:    Chest Pain Non-Obstructive CAD Recent LHC in 08/2024 showed mild non-obstructive CAD. - She continues to have some chest heaviness but it has improved in both  severity and frequency. - Continue Imdur  30mg  daily.  - No Aspirin  given DOAC.  - Continue Lipitor  80mg  daily.  - Suspect chest pain was largely due to volume overload and underlying anemia. No additional ischemic work-up necessary. She had a significant drop in BP after one dose of sublingual Nitro recently with associated syncope so advised patient not to take this again.   Chronic HFpEF Most recent Echo in 08/2024 showed VEF of 70-75% with moderate LVH and mildly enlarged RV with normal RV function. Recent RHC in 08/2024 showed showed severe mixed pulmonary hypertension with mean PAP of 48 mmHg, PCWP of 29 mmHg, PVR 2.88, LVEDP of 21 mmHg. Diuretics were increased.  - Feeling better with higher dose of Lasix .  - Continue Lasix  80mg  daily. Can take an extra 40mg  as needed for weight gain and worsening edema.  - Continue Spironolactone  25mg  daily.  - Continue Losartan  40mg  daily. - Intolerant to Farxiga  in the past.  - Patient is scheduled to have labs rechecked at Hem-Onc tomorrow. Will check a BMET. If renal function is stable, will stop Losartan  and switch to Entresto.   Paroxysmal Atrial Fibrillation Paroxysmal Atrial Tachycardia Monitor in 05/2021 showed 1% atrial fibrillation burden and 30 short runs of SVT/ PAT. Repeat monitor in 11/2021 showed short atrial runs but no atrial fibrillation.  - Maintaining sinus rhythm on exam.  - She reports increased palpitations and quick increase in heart rate with ambulation.  - Continue Cardizem  CD 360mg  daily.  - Currently on Eliquis  5mg  twice daily. However, has only been taking once daily given recurrent epistaxis. We did discuss that this dosing unfortunately does not decrease stroke risk. She is scheduled to see ENT later this month. Discussed with Dr. Anner - okay to hold Lasix  until she sees ENT later this month. - Will order 2 week Zio monitor to reassess atrial fibrillation burden.  Syncope Patient reports recent syncopal episode that  occurred before recent admission for pneumonia. This occurred after she took Nitroglycerin  for chest pain. When EMS arrive, BP was in the 80s/50s.  - No recurrence but she does report intermittent short episodes of  dizziness.  - Suspect syncopal episode was due to hypotension caused by Nitroglycerin . Recommended she not take this again. - Do not think this was due to an arrhythmia but will order a 2 week Zio monitor given reports of intermittent dizziness and to help reassess atrial fibrillation burden.   Moderate to Severe Mitral Stenosis Recent Echo in 08/2024 showed severe MAC with severe mitral stenosis (mean gradient of 16 mmHg with average heart rate of 84 bpm). However, RHC in 08/2024 was more consistent with moderate mitral stenosis. - Continue routine monitoring with surveillance Echos per guidelines.  - She was seen by CT Surgery at [facility] (Dr. Laurence) earlier this year who recommended continuing medical management for now as surgery would be considered high risk given her her multiple comorbidities. However, Dr. Laurence said he would consider mitral valve surgical replacement in the future if unable to manage worsening CHF symptoms.    Hypertension BP well controlled.  - Continue current medications: Imdur  30mg  daily, Cardizem  CD 360mg  daily, Losartan  50mg  daily, and Spironolactone  25mg  daily.  - If BMET tomorrow shows stable renal function, will stop Losartan  and switch to Entresto.   Hyperlipidemia Lipid panel in 06/2024: Total Cholesterol 118, Triglycerides 69, HDL 58.1, LDL 46.  - Continue Lipitor  80mg  daily.    Type 2 Diabetes Mellitus Hemoglobin A1c 6.3% in 102025.  - On Insulin .  - Management per PCP.   CKD Stage IIIa Baseline creatinine around 1.1 to 1.3.    Interstitial Lung Disease Chronic Hypoxic Respiratory Failure  On 4L of O2 24/7 at home. - Management per Pulmonology (Dr. Geronimo).  - RHC will help assess for pulmonary hypertension. Per Dr. Reeves last note, if  there is no significant pulmonary hypertension, then he will consider immunomodulator or antifibrotic for worsening lung disease.    Obstructive Sleep Apnea - Previously on CPAP but she states this was recently recalled. - Managed by Pulmonology. She states that have ordered a repeat sleep study.   Chronic Anemia Thalassemia Epistaxis   History of GI Bleeds Patient was recently admitted in early 08/2024 for acute on chronic anemia. S/p 1 unit of PRBCs. Hemoccult was negative. Patient reported recurrent epistaxis which was felt to possibly be the cause of her drop in hemoglobin. She was seen back in the ED in late 08/2024 for recurrent epistaxis but bleeding stopped with Afrin and nasal packing. - Most recent CBC on 10/18/2023 showed hemoglobin had dropped back down to 7.2 She underwent another transfusion of PRBCs on 09/18/2024.  - She is scheduled to see Hem-Onc tomorrow where she will have repeat labs and then ENT later this month.  - Discussed with Dr. Anner - okay to hold Eliquis  until she sees ENT.  Disposition: Follow up in 3 months.   Signed, Aline FORBES Door, PA-C  09/22/2024 2:28 PM    [facility]

## 2024-09-17 ENCOUNTER — Inpatient Hospital Stay: Attending: Hematology & Oncology

## 2024-09-17 ENCOUNTER — Other Ambulatory Visit: Payer: Self-pay | Admitting: *Deleted

## 2024-09-17 DIAGNOSIS — D509 Iron deficiency anemia, unspecified: Secondary | ICD-10-CM | POA: Diagnosis present

## 2024-09-17 DIAGNOSIS — D649 Anemia, unspecified: Secondary | ICD-10-CM

## 2024-09-17 DIAGNOSIS — D563 Thalassemia minor: Secondary | ICD-10-CM | POA: Diagnosis not present

## 2024-09-17 LAB — PREPARE RBC (CROSSMATCH)

## 2024-09-18 ENCOUNTER — Inpatient Hospital Stay

## 2024-09-18 DIAGNOSIS — D509 Iron deficiency anemia, unspecified: Secondary | ICD-10-CM | POA: Diagnosis not present

## 2024-09-18 DIAGNOSIS — D649 Anemia, unspecified: Secondary | ICD-10-CM

## 2024-09-18 MED ORDER — SODIUM CHLORIDE 0.9% IV SOLUTION
250.0000 mL | INTRAVENOUS | Status: DC
  Administered 2024-09-18: 250 mL via INTRAVENOUS

## 2024-09-18 MED ORDER — DIPHENHYDRAMINE HCL 25 MG PO CAPS
25.0000 mg | ORAL_CAPSULE | Freq: Once | ORAL | Status: AC
  Administered 2024-09-18: 25 mg via ORAL
  Filled 2024-09-18: qty 1

## 2024-09-18 MED ORDER — ACETAMINOPHEN 325 MG PO TABS: 650.0000 mg | ORAL_TABLET | Freq: Once | ORAL | Status: DC

## 2024-09-18 MED ORDER — FUROSEMIDE 10 MG/ML IJ SOLN: 20.0000 mg | Freq: Once | INTRAMUSCULAR | Status: DC

## 2024-09-18 NOTE — Patient Instructions (Signed)
 Blood Transfusion, Adult A blood transfusion is a procedure in which you receive blood through an IV tube. You may need this procedure because of: A bleeding disorder. An illness. An injury. A surgery. The blood may come from someone else (a donor). You may also be able to donate blood for yourself before a surgery. The blood given in a transfusion may be made up of different types of cells. You may get: Red blood cells. These carry oxygen to the cells in the body. Platelets. These help your blood to clot. Plasma. This is the liquid part of your blood. It carries proteins and other substances through the body. White blood cells. These help you fight infections. If you have a clotting disorder, you may also get other types of blood products. Depending on the type of blood product, this procedure may take 1-4 hours to complete. Tell your doctor about: Any bleeding problems you have. Any reactions you have had during a blood transfusion in the past. Any allergies you have. All medicines you are taking, including vitamins, herbs, eye drops, creams, and over-the-counter medicines. Any surgeries you have had. Any medical conditions you have. Whether you are pregnant or may be pregnant. What are the risks? Talk with your health care provider about risks. The most common problems include: A mild allergic reaction. This includes red, swollen areas of skin (hives) and itching. Fever or chills. This may be the body's response to new blood cells received. This may happen during or up to 4 hours after the transfusion. More serious problems may include: A serious allergic reaction. This includes breathing trouble or swelling around the face and lips. Too much fluid in the lungs. This may cause breathing problems. Lung injury. This causes breathing trouble and low oxygen in the blood. This can happen within hours of the transfusion or days later. Too much iron . This can happen after getting many blood  transfusions over a period of time. An infection or virus passed through the blood. This is rare. Donated blood is carefully tested before it is given. Your body's defense system (immune system) trying to attack the new blood cells. This is rare. Symptoms may include fever, chills, nausea, low blood pressure, and low back or chest pain. Donated cells attacking healthy tissues. This is rare. What happens before the procedure? You will have a blood test to find out your blood type. The test also finds out what type of blood your body will accept and matches it to the donor type. If you are going to have a planned surgery, you may be able to donate your own blood. This may be done in case you need a transfusion. You will have your temperature, blood pressure, and pulse checked. You may receive medicine to help prevent an allergic reaction. This may be done if you have had a reaction to a transfusion before. This medicine may be given to you by mouth or through an IV tube. What happens during the procedure?  An IV tube will be put into one of your veins. The bag of blood will be attached to your IV tube. Then, the blood will enter through your vein. Your temperature, blood pressure, and pulse will be checked often. This is done to find early signs of a transfusion reaction. Tell your nurse right away if you have any of these symptoms: Shortness of breath or trouble breathing. Chest or back pain. Fever or chills. Red, swollen areas of skin or itching. If you have any signs  or symptoms of a reaction, your transfusion will be stopped. You may also be given medicine. When the transfusion is finished, your IV tube will be taken out. Pressure may be put on the IV site for a few minutes. A bandage (dressing) will be put on the IV site. The procedure may vary among doctors and hospitals. What happens after the procedure? You will be monitored until you leave the hospital or clinic. This includes  checking your temperature, blood pressure, pulse, breathing rate, and blood oxygen level. Your blood may be tested to see how you have responded to the transfusion. You may be warmed with fluids or blankets. This is done to keep the temperature of your body normal. If you have your procedure in an outpatient setting, you will be told whom to contact to report any reactions. Where to find more information Visit the [employer]: redcross.org Summary A blood transfusion is a procedure in which you receive blood through an IV tube. The blood you are given may be made up of different blood cells. You may receive red blood cells, platelets, plasma, or white blood cells. Your temperature, blood pressure, and pulse will be checked often. After the procedure, your blood may be tested to see how you have responded. This information is not intended to replace advice given to you by your health care provider. Make sure you discuss any questions you have with your health care provider. Document Revised: 12/29/2021 Document Reviewed: 12/29/2021 Elsevier Patient Education  2024 [employer].

## 2024-09-21 LAB — TYPE AND SCREEN
ABO/RH(D): O POS
Antibody Screen: NEGATIVE
Unit division: 0
Unit division: 0

## 2024-09-21 LAB — BPAM RBC
Blood Product Expiration Date: 202601032359
Blood Product Unit Number: 202512202359
ISSUE DATE / TIME: 202512050730
PRODUCT CODE: 202512050730
PRODUCT CODE: 202601032359
Unit Type and Rh: 5100
Unit Type and Rh: 202512202359
Unit Type and Rh: 5100
Unit Type and Rh: 5100

## 2024-09-22 ENCOUNTER — Ambulatory Visit

## 2024-09-22 ENCOUNTER — Ambulatory Visit: Attending: Student | Admitting: Student

## 2024-09-22 ENCOUNTER — Encounter: Payer: Self-pay | Admitting: Student

## 2024-09-22 VITALS — BP 134/60 | HR 63 | Ht 63.0 in | Wt 254.0 lb

## 2024-09-22 DIAGNOSIS — R55 Syncope and collapse: Secondary | ICD-10-CM

## 2024-09-22 DIAGNOSIS — I25119 Atherosclerotic heart disease of native coronary artery with unspecified angina pectoris: Secondary | ICD-10-CM

## 2024-09-22 DIAGNOSIS — D569 Thalassemia, unspecified: Secondary | ICD-10-CM

## 2024-09-22 DIAGNOSIS — J9611 Chronic respiratory failure with hypoxia: Secondary | ICD-10-CM

## 2024-09-22 DIAGNOSIS — G4733 Obstructive sleep apnea (adult) (pediatric): Secondary | ICD-10-CM

## 2024-09-22 DIAGNOSIS — I050 Rheumatic mitral stenosis: Secondary | ICD-10-CM

## 2024-09-22 DIAGNOSIS — D649 Anemia, unspecified: Secondary | ICD-10-CM

## 2024-09-22 DIAGNOSIS — R040 Epistaxis: Secondary | ICD-10-CM

## 2024-09-22 DIAGNOSIS — E785 Hyperlipidemia, unspecified: Secondary | ICD-10-CM

## 2024-09-22 DIAGNOSIS — E118 Type 2 diabetes mellitus with unspecified complications: Secondary | ICD-10-CM

## 2024-09-22 DIAGNOSIS — J849 Interstitial pulmonary disease, unspecified: Secondary | ICD-10-CM

## 2024-09-22 DIAGNOSIS — I10 Essential (primary) hypertension: Secondary | ICD-10-CM

## 2024-09-22 DIAGNOSIS — I4719 Other supraventricular tachycardia: Secondary | ICD-10-CM

## 2024-09-22 DIAGNOSIS — I5032 Chronic diastolic (congestive) heart failure: Secondary | ICD-10-CM

## 2024-09-22 DIAGNOSIS — R079 Chest pain, unspecified: Secondary | ICD-10-CM

## 2024-09-22 DIAGNOSIS — I480 Paroxysmal atrial fibrillation: Secondary | ICD-10-CM

## 2024-09-22 MED ORDER — SPIRONOLACTONE 25 MG PO TABS: 25.0000 mg | ORAL_TABLET | Freq: Every day | ORAL | 3 refills | Status: AC

## 2024-09-22 NOTE — Patient Instructions (Addendum)
 Thank you for choosing [facility]!     Medication Instructions:  No medication changes were made during today's visit.  *If you need a refill on your cardiac medications before your next appointment, please call your pharmacy*   Lab Work: BMET  to be drawn tomorrow (lab orders given) If you have labs (blood work) drawn today and your tests are completely normal, you will receive your results only by: MyChart Message (if you have MyChart) OR A paper copy in the mail If you have any lab test that is abnormal or we need to change your treatment, we will call you to review the results.   Testing/Procedures:  ZIO XT- Long Term Monitor Instructions   Your physician has requested you wear your ZIO patch monitor 14 days.   This is a single patch monitor.  [employer] supplies one patch monitor per enrollment.  Additional stickers are not available.   Please do not apply patch if you will be having a Nuclear Stress Test, Echocardiogram, Cardiac CT, MRI, or Chest Xray during the time frame you would be wearing the monitor. The patch cannot be worn during these tests.  You cannot remove and re-apply the ZIO XT patch monitor.   Your ZIO patch monitor will be sent USPS Priority mail from [facility] directly to your home address. The monitor may also be mailed to a PO BOX if home delivery is not available.   It may take 3-5 days to receive your monitor after you have been enrolled.   Once you have received you monitor, please review enclosed instructions.  Your monitor has already been registered assigning a specific monitor serial # to you.   Applying the monitor   Shave hair from upper left chest.   Hold abrader disc by orange tab.  Rub abrader in 40 strokes over left upper chest as indicated in your monitor instructions.   Clean area with 4 enclosed alcohol pads .  Use all pads to assure are is cleaned thoroughly.  Let dry.   Apply patch as indicated in monitor  instructions.  Patch will be place under collarbone on left side of chest with arrow pointing upward.   Rub patch adhesive wings for 2 minutes.Remove white label marked 1.  Remove white label marked 2.  Rub patch adhesive wings for 2 additional minutes.   While looking in a mirror, press and release button in center of patch.  A small green light will flash 3-4 times .  This will be your only indicator the monitor has been turned on.     Do not shower for the first 24 hours.  You may shower after the first 24 hours.   Press button if you feel a symptom. You will hear a small click.  Record Date, Time and Symptom in the Patient Log Book.   When you are ready to remove patch, follow instructions on last 2 pages of Patient Log Book.  Stick patch monitor onto last page of Patient Log Book.   Place Patient Log Book in [place] box.  Use locking tab on box and tape box closed securely.  The [employer] has [employer] on it.  Please place in mailbox as soon as possible.  Your physician should have your test results approximately 7 days after the monitor has been mailed back to [facility].   Call [facility] Customer Care at [phone] if you have questions regarding your ZIO XT patch monitor.  Call them immediately  if you see an orange light blinking on your monitor.   If your monitor falls off in less than 4 days contact our Monitor department at [phone].  If your monitor becomes loose or falls off after 4 days call [employer] at [phone] for suggestions on securing your monitor.    Your next appointment:   3 month(s)   Provider:   Alm Clay, MD or Aline Door    Follow-Up: At [facility], you and your health needs are our priority.  As part of our continuing mission to provide you with exceptional heart care, we have created designated Provider Care Teams.  These Care Teams include your primary Cardiologist (physician) and Advanced Practice  Providers (APPs -  Physician Assistants and Nurse Practitioners) who all work together to provide you with the care you need, when you need it. We recommend signing up for the patient portal called MyChart.  Sign up information is provided on this After Visit Summary.  MyChart is used to connect with patients for Virtual Visits (Telemedicine).  Patients are able to view lab/test results, encounter notes, upcoming appointments, etc.  Non-urgent messages can be sent to your provider as well.   To learn more about what you can do with MyChart, go to [email or web address].

## 2024-09-22 NOTE — Progress Notes (Unsigned)
Enrolled patient for a 14 day Zio XT monitor to be mailed to patients home  Lynn [name] to read

## 2024-09-23 ENCOUNTER — Encounter: Payer: Self-pay | Admitting: Family

## 2024-09-23 ENCOUNTER — Inpatient Hospital Stay

## 2024-09-23 ENCOUNTER — Inpatient Hospital Stay: Admitting: Family

## 2024-09-23 ENCOUNTER — Other Ambulatory Visit: Payer: Self-pay | Admitting: Family

## 2024-09-23 VITALS — BP 138/38 | HR 72 | Temp 98.5°F | Resp 18

## 2024-09-23 DIAGNOSIS — D649 Anemia, unspecified: Secondary | ICD-10-CM

## 2024-09-23 DIAGNOSIS — D509 Iron deficiency anemia, unspecified: Secondary | ICD-10-CM | POA: Diagnosis not present

## 2024-09-23 LAB — CMP (CANCER CENTER ONLY)
ALT: 10 U/L (ref 0–44)
AST: 20 U/L (ref 15–41)
Albumin: 4.3 g/dL (ref 3.5–5.0)
Alkaline Phosphatase: 75 U/L (ref 38–126)
Anion gap: 9 (ref 5–15)
BUN: 13 mg/dL (ref 8–23)
CO2: 31 mmol/L (ref 22–32)
Calcium: 9.5 mg/dL (ref 8.9–10.3)
Chloride: 97 mmol/L — ABNORMAL LOW (ref 98–111)
Creatinine: 1.11 mg/dL — ABNORMAL HIGH (ref 0.44–1.00)
GFR, Estimated: 53 mL/min — ABNORMAL LOW (ref >60)
Glucose, Bld: 97 mg/dL (ref 70–99)
Potassium: 4.3 mmol/L (ref 3.5–5.1)
Sodium: 137 mmol/L (ref 135–145)
Total Bilirubin: 0.4 mg/dL (ref 0.0–1.2)
Total Protein: 8 g/dL (ref 6.5–8.1)

## 2024-09-23 LAB — CBC WITH DIFFERENTIAL (CANCER CENTER ONLY)
Abs Immature Granulocytes: 0.02 K/uL (ref 0.00–0.07)
Basophils Absolute: 0 K/uL (ref 0.0–0.1)
Basophils Relative: 0 %
Eosinophils Absolute: 0.2 K/uL (ref 0.0–0.5)
Eosinophils Relative: 3 %
HCT: 31.7 % — ABNORMAL LOW (ref 36.0–46.0)
Hemoglobin: 9.5 g/dL — ABNORMAL LOW (ref 12.0–15.0)
Immature Granulocytes: 0 %
Lymphocytes Relative: 14 %
Lymphs Abs: 1 K/uL (ref 0.7–4.0)
MCH: 21.6 pg — ABNORMAL LOW (ref 26.0–34.0)
MCHC: 30 g/dL (ref 30.0–36.0)
MCV: 72 fL — ABNORMAL LOW (ref 80.0–100.0)
Monocytes Absolute: 0.7 K/uL (ref 0.1–1.0)
Monocytes Relative: 10 %
Neutro Abs: 5.1 K/uL (ref 1.7–7.7)
Neutrophils Relative %: 73 %
Platelet Count: 269 K/uL (ref 150–400)
RBC: 4.4 MIL/uL (ref 3.87–5.11)
RDW: 23.6 % — ABNORMAL HIGH (ref 11.5–15.5)
WBC Count: 7 K/uL (ref 4.0–10.5)
nRBC: 0 % (ref 0.0–0.2)

## 2024-09-23 LAB — IRON AND IRON BINDING CAPACITY (CC-WL,HP ONLY)
Iron: 61 ug/dL (ref 28–170)
Saturation Ratios: 16 % (ref 10.4–31.8)
TIBC: 386 ug/dL (ref 250–450)
UIBC: 325 ug/dL

## 2024-09-23 LAB — RETICULOCYTES
Immature Retic Fract: 25.3 % — ABNORMAL HIGH (ref 2.3–15.9)
RBC.: 4.37 MIL/uL (ref 3.87–5.11)
Retic Count, Absolute: 77.8 K/uL (ref 19.0–186.0)
Retic Ct Pct: 1.8 % (ref 0.4–3.1)

## 2024-09-23 LAB — FERRITIN: Ferritin: 81 ng/mL (ref 11–307)

## 2024-09-23 NOTE — Progress Notes (Unsigned)
 Hematology and Oncology Follow Up Visit  Lynn [name] [id number] Sep 29, 1956 68 y.o. 09/23/2024   Principle Diagnosis:  Iron deficiency anemia  Beta thalassemia minor   Current Therapy:        IV iron as indicated  Folic acid  2 mg PO daily   Interim History:  Lynn [name] is here today to re-establish care. We worked with her PCP last week to go ahead and transfuse her 2 units for Hgb 7.2, MCV 64, platelets 301 and WBC count 8.1.  Hgb is much improved at 9.5, MCV 72, platelets 269 and WBC count 7.0.  Iron saturation is ending. She may also benefit from IV iron.  She has been craving ice.  She had been hospitalized in November with pneumonia and then had to return to the ED a few days after discharge with a nosebleed. This stopped with EMS using Afrin on a cotton ball placed in her nose.  She was on Eliquis  for atrial fib but cardiology has placed this on hold until she sees ENT on 12/30.  She has choric fatigue, chest discomfort, dizziness and SOB with IPF which she states is from her prior use of Methotrexate. She is now on 4L supplemental O2 24 hours a day.  She denies fever, chills, n/v, cough, rash, palpitations, abdominal pain or changes in bowel or bladder habits.  No falls or syncope reported.  Appetite and hydration are fair. Weight was 256 lbs yesterday with cardiology.   ECOG Performance Status: 1 - Symptomatic but completely ambulatory  Medications:  Allergies as of 09/23/2024       Reactions   Methotrexate Other (See Comments)   Pneumonitis   Penicillins Hives   Childhood allergy   Farxiga  [dapagliflozin ] Other (See Comments)   Dizzy and lethargic   Metformin  And Related Diarrhea, Other (See Comments)   Also, bleeding        Medication List        Accurate as of September 23, 2024  1:35 PM. If you have any questions, ask your nurse or doctor.          acetaminophen  500 MG tablet Commonly known as: TYLENOL  Take 2,000 mg by mouth daily at 12  noon.   albuterol  (2.5 MG/3ML) 0.083% nebulizer solution Commonly known as: PROVENTIL  INHALE 3 ML BY NEBULIZATION EVERY 6 HOURS AS NEEDED FOR WHEEZING OR SHORTNESS OF BREATH What changed: See the new instructions.   albuterol  108 (90 Base) MCG/ACT inhaler Commonly known as: VENTOLIN  HFA TAKE 2 PUFFS BY MOUTH EVERY 6 HOURS AS NEEDED FOR WHEEZE OR SHORTNESS OF BREATH What changed: Another medication with the same name was changed. Make sure you understand how and when to take each.   ALPRAZolam  0.25 MG tablet Commonly known as: XANAX  Take 1 tablet (0.25 mg total) by mouth 2 (two) times daily as needed for anxiety. TAKE 1-2 TABLETS BY MOUTH 2 TIMES DAILY AS NEEDED FOR ANXIETY OR INSOMNIA What changed:  when to take this additional instructions   apixaban  5 MG Tabs tablet Commonly known as: ELIQUIS  Take 1 tablet (5 mg total) by mouth 2 (two) times daily.   atorvastatin  80 MG tablet Commonly known as: LIPITOR  Take 1 tablet (80 mg total) by mouth every evening.   cetirizine 10 MG tablet Commonly known as: ZYRTEC Take 10 mg by mouth daily as needed for allergies.   Contour Next Test test strip Generic drug: glucose blood 3x daily   cyanocobalamin  1000 MCG tablet Commonly known as: VITAMIN B12 Take  1 tablet (1,000 mcg total) by mouth daily.   Dexcom G7 Sensor Misc 1 Device by Does not apply route as directed.   diltiazem  360 MG 24 hr capsule Commonly known as: CARDIZEM  CD Take 1 capsule (360 mg total) by mouth daily.   diphenhydramine -acetaminophen  25-500 MG Tabs tablet Commonly known as: TYLENOL  PM Take 2 tablets by mouth at bedtime.   ferrous sulfate  325 (65 FE) MG EC tablet Take 1 tablet (325 mg total) by mouth daily with breakfast.   folic acid  800 MCG tablet Commonly known as: FOLVITE  Take 800 mcg by mouth every other day.   furosemide  40 MG tablet Commonly known as: LASIX  Take 80 mg by mouth in the morning and an additional 40 mg for an overnight weight gain  of 3 pounds or 5 pounds in a week   gabapentin  100 MG capsule Commonly known as: NEURONTIN  Take 2 capsules (200 mg total) by mouth at bedtime.   guaiFENesin  100 MG/5ML liquid Commonly known as: ROBITUSSIN Take 5 mLs by mouth every 4 (four) hours as needed for cough or to loosen phlegm.   HM MULTIVITAMIN ADULT GUMMY PO Take 2 each by mouth daily.   hydroxychloroquine  200 MG tablet Commonly known as: PLAQUENIL  Take 200 mg by mouth at bedtime.   Incontinence Supply Disposable Misc Please dispense Because Overnight Underwear Xl   insulin  lispro 100 UNIT/ML injection Commonly known as: HUMALOG  Max Daily 70 units per pump   ipratropium-albuterol  0.5-2.5 (3) MG/3ML Soln Commonly known as: DUONEB Take 3 mLs by nebulization 2 (two) times daily.   isosorbide  mononitrate 30 MG 24 hr tablet Commonly known as: IMDUR  Take 1 tablet (30 mg total) by mouth daily.   Lancets 30G Misc 1 Device by Does not apply route 3 (three) times daily.   losartan  100 MG tablet Commonly known as: COZAAR  Take 0.5 tablets (50 mg total) by mouth daily.   melatonin 5 MG Tabs Take 20 mg by mouth at bedtime.   nitroGLYCERIN  0.4 MG SL tablet Commonly known as: NITROSTAT  Place 1 tablet (0.4 mg total) under the tongue every 5 (five) minutes as needed for chest pain.   Omnipod 5 G7 Pods (Gen 5) Misc 1 Device by Other route every other day.   OXYGEN  Inhale 3 L/min into the lungs continuous.   oxymetazoline  0.05 % nasal spray Commonly known as: Afrin Nasal Spray Place 1 spray into both nostrils 2 (two) times daily as needed for congestion (Nosebleed).   pantoprazole  40 MG tablet Commonly known as: PROTONIX  Take 1 tablet (40 mg total) by mouth daily before breakfast. What changed:  when to take this reasons to take this   sodium chloride  0.65 % Soln nasal spray Commonly known as: OCEAN Place 1 spray into both nostrils in the morning and at bedtime.   spironolactone  25 MG tablet Commonly known  as: ALDACTONE  Take 1 tablet (25 mg total) by mouth daily.   Mounjaro  12.5 MG/0.5ML Pen Generic drug: tirzepatide  Inject 12.5 mg into the skin once a week.   tirzepatide  15 MG/0.5ML Pen Commonly known as: MOUNJARO  Inject 15 mg into the skin once a week.   traZODone  50 MG tablet Commonly known as: DESYREL  Take 0.5-1 tablets (25-50 mg total) by mouth at bedtime as needed for sleep. Needs appt What changed: how much to take   VICKS SINEX NA Place 1 spray into both nostrils every 12 (twelve) hours as needed (for congestion- MAX OF THREE CONSECUTIVE DAYS).        Allergies:  Allergies  Allergen Reactions   Methotrexate Other (See Comments)    Pneumonitis   Penicillins Hives    Childhood allergy    Farxiga  [Dapagliflozin ] Other (See Comments)    Dizzy and lethargic    Metformin  And Related Diarrhea and Other (See Comments)    Also, bleeding    Past Medical History, Surgical history, Social history, and Family History were reviewed and updated.  Review of Systems: All other 10 point review of systems is negative.   Physical Exam:  vitals were not taken for this visit.   Wt Readings from Last 3 Encounters:  09/22/24 254 lb (115.2 kg)  09/16/24 256 lb (116.1 kg)  09/11/24 253 lb 8.5 oz (115 kg)    Ocular: Sclerae unicteric, pupils equal, round and reactive to light Ear-nose-throat: Oropharynx clear, dentition fair Lymphatic: No cervical or supraclavicular adenopathy Lungs no rales or rhonchi, good excursion bilaterally Heart regular rate and rhythm, no murmur appreciated Abd soft, nontender, positive bowel sounds MSK no focal spinal tenderness, no joint edema Neuro: non-focal, well-oriented, appropriate affect Breasts: Deferred   Lab Results  Component Value Date   WBC 8.1 09/16/2024   HGB 7.2 Repeated and verified X2. (LL) 09/16/2024   HCT 23.7 Repeated and verified X2. (LL) 09/16/2024   MCV 64.1 Repeated and verified X2. (L) 09/16/2024   PLT 301.0  09/16/2024   Lab Results  Component Value Date   FERRITIN 23.4 09/16/2024   IRON 89 08/17/2024   TIBC 428 08/17/2024   UIBC 339 08/17/2024   IRONPCTSAT 21 08/17/2024   Lab Results  Component Value Date   RETICCTPCT 1.9 06/07/2022   RBC 3.70 (L) 09/16/2024   Lab Results  Component Value Date   KAPLAMBRATIO 4.04 03/31/2021   No results found for: KIMBERLY LE, [facility] Lab Results  Component Value Date   TOTALPROTELP 7.5 03/30/2021   ALBUMINELP 3.3 03/30/2021   A1GS 0.3 03/30/2021   A2GS 1.1 (H) 03/30/2021   BETS 0.9 03/30/2021   GAMS 1.9 (H) 03/30/2021   MSPIKE 0.8 (H) 03/30/2021   SPEI Comment 03/30/2021     Chemistry      Component Value Date/Time   NA 138 09/08/2024 0230   NA 137 08/14/2024 1629   K 4.3 09/08/2024 0230   CL 100 09/08/2024 0230   CO2 27 09/08/2024 0230   BUN 19 09/08/2024 0230   BUN 14 08/14/2024 1629   CREATININE 1.03 (H) 09/08/2024 0230   CREATININE 1.06 (H) 07/12/2023 1452   GLU 119 09/04/2022 0000      Component Value Date/Time   CALCIUM  9.3 09/08/2024 0230   ALKPHOS 78 09/05/2024 1704   AST 21 09/05/2024 1704   AST 17 04/09/2022 1250   ALT 14 09/05/2024 1704   ALT 16 04/09/2022 1250   BILITOT 0.4 09/05/2024 1704   BILITOT 0.3 04/09/2022 1250       Impression and Plan: Lynn [name] is a very pleasant 68 yo African American female with long history of microcytic anemia and beta thalassemia minor.  She is slowly recuperating from her recent bout with pneumonia and anemia.  No transfusion needed this visit. She received 2 units last week and her counts remain stable.  Iron studies are pending. We will replace if needed.  Continue daily folic acid .  Follow-up in 1 month.  Lauraine Pepper, NP 12/10/20251:35 PM

## 2024-09-24 ENCOUNTER — Encounter: Payer: Self-pay | Admitting: Nurse Practitioner

## 2024-09-24 ENCOUNTER — Ambulatory Visit: Admitting: Nurse Practitioner

## 2024-09-24 VITALS — BP 124/62 | HR 78 | Temp 97.5°F | Ht 63.0 in | Wt 256.0 lb

## 2024-09-24 DIAGNOSIS — R911 Solitary pulmonary nodule: Secondary | ICD-10-CM

## 2024-09-24 DIAGNOSIS — G4733 Obstructive sleep apnea (adult) (pediatric): Secondary | ICD-10-CM

## 2024-09-24 DIAGNOSIS — J449 Chronic obstructive pulmonary disease, unspecified: Secondary | ICD-10-CM

## 2024-09-24 DIAGNOSIS — J9611 Chronic respiratory failure with hypoxia: Secondary | ICD-10-CM

## 2024-09-24 DIAGNOSIS — I272 Pulmonary hypertension, unspecified: Secondary | ICD-10-CM

## 2024-09-24 DIAGNOSIS — I5032 Chronic diastolic (congestive) heart failure: Secondary | ICD-10-CM

## 2024-09-24 DIAGNOSIS — I050 Rheumatic mitral stenosis: Secondary | ICD-10-CM

## 2024-09-24 DIAGNOSIS — D500 Iron deficiency anemia secondary to blood loss (chronic): Secondary | ICD-10-CM

## 2024-09-24 DIAGNOSIS — J849 Interstitial pulmonary disease, unspecified: Secondary | ICD-10-CM

## 2024-09-24 DIAGNOSIS — J189 Pneumonia, unspecified organism: Secondary | ICD-10-CM

## 2024-09-24 NOTE — Progress Notes (Signed)
 @Patient  ID: Lynn [name], female    DOB: [birth year], 68 y.o.   MRN: [id number]  Chief Complaint  Patient presents with   Interstitial Lung Disease    Referring provider: Copland, [name], MD  HPI: 68 year old female, former smoker (11 pack history) followed for ILD, hx of pneumonitis, OSA. She is a patient of Dr. Reeves and last seen in office 08/13/2024. Past medical history significant for rheumatoid arthritis previously on methotrexate, HTN, HFpEF, DM II, chronic anemia thalassemia.   She was last seen in 2022 after being admitted in June to the hospital for acute respiratory failure secondary to pneumonitis, felt to be methotrexate or feather pill hypersenstivity after undergoing bronchoscopy with predominant lymphocytosis greater than 60% on BAL. She was treated with high dose steroids and bactrim  for PJP prophylaxis. Imaging improved so she was tapered off by September 2022.  TEST/EVENTS:  01/14/2018 PFT: FVC 77, FEV1 84, ratio 87, TLC 75, DLCO 70.  No BD 06/23/2021 HRCT chest: Atherosclerosis.  Severe calcifications of mitral valve.  Much of the previously noted parenchymal lung changes have resolved.  Continues to be some patchy areas of groundglass attenuation, septal thickening and mild subpleural reticulation scattered throughout the lungs bilaterally.  No discernible craniocaudal gradient.  No traction bronchiectasis or frank honeycombing.  Favored to reflect a mild postinfectious or inflammatory fibrosis, significantly improved compared to prior exam.  Some mild air trapping indicative of mild small airways disease. 11/23/2022 HRCT chest: Mild areas of subpleural reticulation with minimal associated traction bronchiectasis and no clear craniocaudal gradient.  Associated air trapping suggestive of hypersensitivity pneumonitis.  No evidence of progression when compared with previous CT.  Not UIP.  CAD and atherosclerosis. 12/14/2022 PFT: FVC 42, FEV1 46, TLC 58, DLCOunc  67 06/22/2024 HRCT chest: CAD/atherosclerosis. Enlarged PA. Few subcm mediastinal nodes. Mosaic ground glass through both lungs, exaggerated compared to 2024. Subpleural distortion, interlobular septal thickening and fibrosis, new from prior in right lung apex and RLL. Scattered micronodules.  09/04/2024 RHC: severe mixed PH consistent with intrinsic pulmonary hypertension, LV diastolic dysfunction and mitral stenosis (PCWP 29 Miller mercury, LVEDP 21 mmHg) 09/05/2204 CTA chest: new 9 mm nodule in the RLL. Scattered atelectasis and scarring in upper lobes. 09/06/2024 echo: EF 70-75%. RV function normal, mildly enlarged. Normal PASP. LA moderately dilated. Severe MS. Trivial MR.    08/13/2024: OV with Dr. Geronimo. Worsening SOB. ILD with large lymphocyte count. Since earlier in the year, increased SOB with increased O2 requirements. Oxygen  as low as 79% at times, even at rest. Has elevated HR as well. CT reporting possible CTEPH (although on Eliquis ). Could be worsening ILD. Last RHC was in 2022. Hx of a fib and mitral stenosis. Considered high risk for surgery by [facility]. Does have RA-not seen rheum recently. Not using her CPAP. Unclear if CT worse because of edema. Consider RHC. If no significant PH, could consider immunomodulator or antifibrotic for possible worsening lung disease.   09/24/2024: Today - follow up Discussed the use of AI scribe software for clinical note transcription with the patient, who gave verbal consent to proceed.  History of Present Illness Lynn [name] is a 68 year old female with pulmonary hypertension and mitral valve disease who presents for follow up.  Since her last visit, She was scheduled RHC/LHC due to chest pain/pressure and SOB. Initially this was scheduled for 08/19/2024; however, she had worsening anemia with hgb down to 7.6 prior to the procedure and was admitted 11/3. Recent epistaxis. She was  admitted to the hospital and hemoccult was negative.  She was treated with 1 unit PRBCs and advised to follow up with ENT. She was discharged home once hgb stabilized on 08/18/2024.  She had R/LHC on 09/04/2024. Findings consistent with mild nonobstructive CAD and severe mixed pulmonary hypertension, consistent with idiopathic PH, LV diastolic dysfunction, and mitral stenosis. Shew as treated with IV lasix  post procedure and discharged with orders to increased home PO lasix  dosing.   Following her RHC, she was admitted for pneumonia on 11/22-11/25. She had a new RLL 9 mm nodule, felt to be infectious given timeframe from prior scan. She was treated with azithromycin  and ceftriaxone . She was discharged home with duonebs.   She then went back to the ED 11/28 for epistaxis. She was treated with afin and cotton balls placed by EMS. Upon removal, no active bleeding. She was discharged home.   At a subsequent follow up, she had worsening anemia, down to 7.2. She had another transfusion 12/5 and has been following with hematology.   Today, she tells me her breathing has improved following changes in her diuretic regimen and improved hemoglobin levels. She's not experiencing the chest pain she previously had. Cardiology had suggested switch to Entresto from losartan  but doesn't appear this change has been made yet. She is holding her Eliquis  until 12/30 per cardiology's approval. She is scheduled to see ENT then. She's not had any recent nose bleeds.   She consistently uses supplemental oxygen  and has been able to reduce her oxygen  flow back to three liters, which was her baseline before recent health issues. Occasional coughing persists, though it has lessened. No significant phlegm. No wheezing. She attributes some coughing to nasal drip and sinus issues. She is still using afrin and saline nasal sprays.   She uses a nebulizer treatment with Duoneb three times a day since being discharged from the hospital for the pneumonia. She is unsure if it's making a huge  difference. She denies any chest congestion, fevers, hemoptysis, weight loss, anorexia.  No orthopnea, palpitations, CP, leg swelling, PND, syncope.   She closely monitors her weight to manage fluid retention, noting any significant changes that might indicate fluid overload.   No recent low oxygen  levels.     Allergies  Allergen Reactions   Methotrexate Other (See Comments)    Pneumonitis   Penicillins Hives    Childhood allergy    Farxiga  [Dapagliflozin ] Other (See Comments)    Dizzy and lethargic    Metformin  And Related Diarrhea and Other (See Comments)    Also, bleeding    Immunization History  Administered Date(s) Administered   Fluad Quad(high Dose 65+) 07/19/2021   Fluad Trivalent(High Dose 65+) 07/17/2023   INFLUENZA, HIGH DOSE SEASONAL PF 06/10/2019, 06/17/2024   Influenza Split 07/22/2012   Influenza,inj,Quad PF,6+ Mos 10/09/2014, 07/13/2015, 11/17/2016, 07/04/2017, 07/10/2018   Influenza-Unspecified 06/20/2020, 07/14/2022   Moderna Covid-19 Fall Seasonal Vaccine 29yrs & older 08/02/2023   Moderna Sars-Covid-2 Vaccination 11/27/2019, 12/26/2019, 06/22/2020, 12/30/2020, 06/21/2021   Pneumococcal Conjugate-13 03/23/2019   Pneumococcal Polysaccharide-23 08/22/2020   Pneumococcal-Unspecified 02/12/2010   Tdap 02/12/2010, 10/17/2016   Zoster Recombinant(Shingrix ) 02/20/2021    Past Medical History:  Diagnosis Date   Allergy 2018   Anxiety    CHF (congestive heart failure) (HCC)    Clotting disorder    blood clot in eye 2016   COPD (chronic obstructive pulmonary disease) (HCC)    Coronary artery disease, non-occlusive    a. 11/2016 NSTEMI/Cath: LM nl, LAD 40p, 50md,  D1/2 small, LCX 40ost, OM2/3 nl/small, RCA 35 ost/mid, RPDA/RPL small/nl.   Coronary vasospasm    Depression    Diastolic dysfunction    a. 11/2016 Echo: EF 55-60%, gr1 DD, sev calcified MV annulus, mildly dil LA.   Eye hemorrhage 01/2015   right; resolved (07/03/2017)   Gastric ulcer    GERD  (gastroesophageal reflux disease)    Headache    monthly (07/03/2017)   Heart murmur    History of blood transfusion    when I had an ectopic pregnancy   History of stomach ulcers    Hyperlipidemia    Hypertension    Microcytic anemia    Moderate mitral stenosis by prior echocardiogram 03/2021   TTE: Mod MS (mean gradient 9 mmHg). -- TEE Moderate-Severe MS (mean gradient 14 mmHg) with fixed Post MV Leaflet & Severe MAC w/ large circumscribed calcified mass on the Post MV Leaflet. (Mass previously described in 2018) - CMRI identifies calcified mass & not Tumor.; R&LHC - LVEDP & PCWP both 23 mmHg indicating minimal resting gradient   Myocardial infarction (HCC) 2023   OSA on CPAP    setting is unknown   Oxygen  deficiency    PAF (paroxysmal atrial fibrillation) (HCC) 03/2021   Event Monitor Aug-Sept 2022: Predominantly sinus rhythm.  1% A. fib burden.  30 brief episodes of PAT.  2 short bursts of NSVT.   PAT (paroxysmal atrial tachycardia)    Event monitor from August 2022 showed 30 brief bursts longest 14 seconds.   Pneumonia    several times (07/03/2017)   PVC's (premature ventricular contractions)    Rheumatoid arthritis (HCC)    Sickle cell anemia (HCC)    Sleep apnea    Stroke (HCC)    Syncope 07/03/2017 X 2   called seizures but no medications   Thalassemia    my cells are sickle cell shaped but I don't have sickle cell anemia (07/03/2017)   Type II diabetes mellitus (HCC)     Tobacco History: Social History   Tobacco Use  Smoking Status Former   Current packs/day: 0.00   Average packs/day: 0.3 packs/day for 44.0 years (11.0 ttl pk-yrs)   Types: Cigarettes   Start date: 02/17/1971   Quit date: 02/17/2015   Years since quitting: 9.6  Smokeless Tobacco Never   Counseling given: Not Answered   Outpatient Medications Prior to Visit  Medication Sig Dispense Refill   acetaminophen  (TYLENOL ) 500 MG tablet Take 2,000 mg by mouth daily at 12 noon.     albuterol   (PROVENTIL ) (2.5 MG/3ML) 0.083% nebulizer solution INHALE 3 ML BY NEBULIZATION EVERY 6 HOURS AS NEEDED FOR WHEEZING OR SHORTNESS OF BREATH (Patient taking differently: Take 2.5 mg by nebulization 2 (two) times daily.) 150 mL 7   albuterol  (VENTOLIN  HFA) 108 (90 Base) MCG/ACT inhaler TAKE 2 PUFFS BY MOUTH EVERY 6 HOURS AS NEEDED FOR WHEEZE OR SHORTNESS OF BREATH 8.5 each 2   ALPRAZolam  (XANAX ) 0.25 MG tablet Take 1 tablet (0.25 mg total) by mouth 2 (two) times daily as needed for anxiety. TAKE 1-2 TABLETS BY MOUTH 2 TIMES DAILY AS NEEDED FOR ANXIETY OR INSOMNIA (Patient taking differently: Take 0.25 mg by mouth at bedtime.) 60 tablet 1   apixaban  (ELIQUIS ) 5 MG TABS tablet Take 1 tablet (5 mg total) by mouth 2 (two) times daily. 180 tablet 3   atorvastatin  (LIPITOR ) 80 MG tablet Take 1 tablet (80 mg total) by mouth every evening. 90 tablet 3   cetirizine (ZYRTEC) 10 MG tablet  Take 10 mg by mouth daily as needed for allergies.     Continuous Glucose Sensor (DEXCOM G7 SENSOR) MISC 1 Device by Does not apply route as directed. 9 each 3   cyanocobalamin  (VITAMIN B12) 1000 MCG tablet Take 1 tablet (1,000 mcg total) by mouth daily. 30 tablet 0   diltiazem  (CARDIZEM  CD) 360 MG 24 hr capsule Take 1 capsule (360 mg total) by mouth daily. 90 capsule 3   diphenhydramine -acetaminophen  (TYLENOL  PM) 25-500 MG TABS tablet Take 2 tablets by mouth at bedtime.     ferrous sulfate  325 (65 FE) MG EC tablet Take 1 tablet (325 mg total) by mouth daily with breakfast. 60 tablet 3   folic acid  (FOLVITE ) 800 MCG tablet Take 800 mcg by mouth every other day.     furosemide  (LASIX ) 40 MG tablet Take 80 mg by mouth in the morning and an additional 40 mg for an overnight weight gain of 3 pounds or 5 pounds in a week 135 tablet 3   gabapentin  (NEURONTIN ) 100 MG capsule Take 2 capsules (200 mg total) by mouth at bedtime. 60 capsule 0   glucose blood (CONTOUR NEXT TEST) test strip 3x daily 300 each 11   guaiFENesin  (ROBITUSSIN) 100  MG/5ML liquid Take 5 mLs by mouth every 4 (four) hours as needed for cough or to loosen phlegm. 120 mL 0   hydroxychloroquine  (PLAQUENIL ) 200 MG tablet Take 200 mg by mouth at bedtime.     Incontinence Supply Disposable MISC Please dispense Because Overnight Underwear Xl 100 each 4   Insulin  Disposable Pump (OMNIPOD 5 G7 PODS, GEN 5,) MISC 1 Device by Other route every other day. 45 each 3   insulin  lispro (HUMALOG ) 100 UNIT/ML injection Max Daily 70 units per pump 70 mL 3   ipratropium-albuterol  (DUONEB) 0.5-2.5 (3) MG/3ML SOLN Take 3 mLs by nebulization 2 (two) times daily. 360 mL 0   isosorbide  mononitrate (IMDUR ) 30 MG 24 hr tablet Take 1 tablet (30 mg total) by mouth daily. 90 tablet 3   Lancets 30G MISC 1 Device by Does not apply route 3 (three) times daily. 300 each 9   losartan  (COZAAR ) 100 MG tablet Take 0.5 tablets (50 mg total) by mouth daily. 45 tablet 3   melatonin 5 MG TABS Take 20 mg by mouth at bedtime.     Multiple Vitamins-Minerals (HM MULTIVITAMIN ADULT GUMMY PO) Take 2 each by mouth daily.     nitroGLYCERIN  (NITROSTAT ) 0.4 MG SL tablet Place 1 tablet (0.4 mg total) under the tongue every 5 (five) minutes as needed for chest pain. 25 tablet 3   OXYGEN  Inhale 3 L/min into the lungs continuous.     oxymetazoline  (AFRIN NASAL SPRAY) 0.05 % nasal spray Place 1 spray into both nostrils 2 (two) times daily as needed for congestion (Nosebleed). 30 mL 0   Oxymetazoline  HCl (VICKS SINEX NA) Place 1 spray into both nostrils every 12 (twelve) hours as needed (for congestion- MAX OF THREE CONSECUTIVE DAYS).     pantoprazole  (PROTONIX ) 40 MG tablet Take 1 tablet (40 mg total) by mouth daily before breakfast. (Patient taking differently: Take 40 mg by mouth daily as needed (indigestion).) 90 tablet 3   sodium chloride  (OCEAN) 0.65 % SOLN nasal spray Place 1 spray into both nostrils in the morning and at bedtime. 15 mL 0   spironolactone  (ALDACTONE ) 25 MG tablet Take 1 tablet (25 mg total) by  mouth daily. 90 tablet 3   tirzepatide  (MOUNJARO ) 12.5 MG/0.5ML Pen Inject  12.5 mg into the skin once a week.     tirzepatide  (MOUNJARO ) 15 MG/0.5ML Pen Inject 15 mg into the skin once a week. 6 mL 3   traZODone  (DESYREL ) 50 MG tablet Take 0.5-1 tablets (25-50 mg total) by mouth at bedtime as needed for sleep. Needs appt (Patient taking differently: Take 50-100 mg by mouth at bedtime as needed for sleep. Needs appt) 90 tablet 3   No facility-administered medications prior to visit.     Review of Systems: as above    Physical Exam:  BP 124/62   Pulse 78   Temp (!) 97.5 F (36.4 C)   Ht 5' 3 (1.6 m) Comment: pt stated  Wt 256 lb (116.1 kg) Comment: pt stated  LMP  (LMP Unknown)   SpO2 94% Comment: 4l o2 poc  BMI 45.35 kg/m   GEN: Pleasant, interactive, well-kempt; chronically-ill appearing; obese; in no acute distress. HEENT:  Normocephalic and atraumatic. PERRLA. Sclera white. Nasal turbinates pink, moist and patent bilaterally. No rhinorrhea present. Oropharynx pink and moist, without exudate or edema. No lesions, ulcerations, or postnasal drip. Mallampati III NECK:  Supple w/ fair ROM. No JVD present. No lymphadenopathy.   CV: RRR, murmur, no peripheral edema. Pulses intact, +2 bilaterally. No cyanosis, pallor or clubbing. PULMONARY:  Unlabored, regular breathing. Diminished bibasilar airflow otherwise clear bilaterally A&P w/o wheezes/rales/rhonchi. No accessory muscle use.  GI: BS present and normoactive. Soft, non-tender to palpation.  MSK: No erythema, warmth or tenderness. Cap refil <2 sec all extrem. No deformities or joint swelling noted.  Neuro: A/Ox3. No focal deficits noted.   Skin: Warm, no lesions or rashe Psych: Normal affect and behavior. Judgement and thought content appropriate.     Lab Results:  CBC    Component Value Date/Time   WBC 7.0 09/23/2024 1313   WBC 8.1 09/16/2024 1138   RBC 4.37 09/23/2024 1313   RBC 4.40 09/23/2024 1313   HGB 9.5 (L)  09/23/2024 1313   HGB 7.6 (L) 08/14/2024 1629   HGB 9.0 (L) 02/26/2017 1501   HCT 31.7 (L) 09/23/2024 1313   HCT 27.8 (L) 08/14/2024 1629   HCT 29.1 (L) 02/26/2017 1501   PLT 269 09/23/2024 1313   PLT 312 08/14/2024 1629   MCV 72.0 (L) 09/23/2024 1313   MCV 67 (L) 08/14/2024 1629   MCV 61 (L) 02/26/2017 1501   MCH 21.6 (L) 09/23/2024 1313   MCHC 30.0 09/23/2024 1313   RDW 23.6 (H) 09/23/2024 1313   RDW 17.2 (H) 08/14/2024 1629   RDW 17.1 (H) 02/26/2017 1501   LYMPHSABS 1.0 09/23/2024 1313   LYMPHSABS 2.5 02/26/2017 1501   MONOABS 0.7 09/23/2024 1313   EOSABS 0.2 09/23/2024 1313   EOSABS 0.2 02/26/2017 1501   BASOSABS 0.0 09/23/2024 1313   BASOSABS 0.0 02/26/2017 1501    BMET    Component Value Date/Time   NA 137 09/23/2024 1313   NA 137 08/14/2024 1629   K 4.3 09/23/2024 1313   CL 97 (L) 09/23/2024 1313   CO2 31 09/23/2024 1313   GLUCOSE 97 09/23/2024 1313   BUN 13 09/23/2024 1313   BUN 14 08/14/2024 1629   CREATININE 1.11 (H) 09/23/2024 1313   CREATININE 1.06 (H) 07/12/2023 1452   CALCIUM  9.5 09/23/2024 1313   GFRNONAA 53 (L) 09/23/2024 1313   GFRNONAA 65 01/03/2016 1118   GFRAA 56 08/25/2020 0000   GFRAA 75 01/03/2016 1118    BNP    Component Value Date/Time   BNP 78.8 09/06/2024  9897     Imaging:  ECHOCARDIOGRAM COMPLETE Result Date: 09/06/2024    ECHOCARDIOGRAM REPORT   Patient Name:   Dejae Bernet Date of Exam: 09/06/2024 Medical Rec #:  [id number]               Height:       63.0 in Accession #:    [id number]              Weight:       253.5 lb Date of Birth:  09-Apr-1956                BSA:          2.139 m Patient Age:    68 years                BP:           132/70 mmHg Patient Gender: F                       HR:           70 bpm. Exam Location:  Inpatient Procedure: 2D Echo, Cardiac Doppler and Color Doppler (Both Spectral and Color            Flow Doppler were utilized during procedure). Indications:    Chest Pain R07.9  History:         Patient has prior history of Echocardiogram examinations, most                 recent 11/30/2022. CHF, Angina and CAD, Pulmonary HTN and COPD,                 Arrythmias:Atrial Fibrillation and Tachycardia,                 Signs/Symptoms:Syncope and Chest Pain; Risk                 Factors:Hypertension, Sleep Apnea, Diabetes and Dyslipidemia.  Sonographer:    Thea Norlander RCS Referring Phys: BRADLY MARLA DRONES  Sonographer Comments: Image acquisition challenging due to patient body habitus. Patient declined Definity . IMPRESSIONS  1. Left ventricular ejection fraction, by estimation, is 70 to 75%. The left ventricle has hyperdynamic function. The left ventricle has no regional wall motion abnormalities. There is moderate left ventricular hypertrophy. Left ventricular diastolic parameters are indeterminate.  2. Right ventricular systolic function is normal. The right ventricular size is mildly enlarged. There is normal pulmonary artery systolic pressure. The estimated right ventricular systolic pressure is 23.8 mmHg.  3. Left atrial size was moderately dilated.  4. Exuberant, likely caseating, mitral annular calcifications, seen on prior echos. The mitral valve is degenerative. Trivial mitral valve regurgitation. Severe mitral stenosis. The mean mitral valve gradient is 16.0 mmHg with average heart rate of 84 bpm. Severe mitral annular calcification.  5. The aortic valve is grossly normal. There is mild calcification of the aortic valve. Aortic valve regurgitation is not visualized. Aortic valve sclerosis is present, with no evidence of aortic valve stenosis. Aortic valve mean gradient measures 8.0 mmHg.  6. The inferior vena cava is normal in size with greater than 50% respiratory variability, suggesting right atrial pressure of 3 mmHg. FINDINGS  Left Ventricle: Left ventricular ejection fraction, by estimation, is 70 to 75%. The left ventricle has hyperdynamic function. The left ventricle has no regional wall  motion abnormalities. The left ventricular internal cavity size was normal in size. There is moderate left ventricular hypertrophy. Left ventricular diastolic  parameters are indeterminate. Right Ventricle: The right ventricular size is mildly enlarged. No increase in right ventricular wall thickness. Right ventricular systolic function is normal. There is normal pulmonary artery systolic pressure. The tricuspid regurgitant velocity is 2.28  m/s, and with an assumed right atrial pressure of 3 mmHg, the estimated right ventricular systolic pressure is 23.8 mmHg. Left Atrium: Left atrial size was moderately dilated. Right Atrium: Right atrial size was normal in size. Pericardium: There is no evidence of pericardial effusion. Mitral Valve: Exuberant, likely caseating, mitral annular calcifications, seen on prior echos. The mitral valve is degenerative in appearance. Severe mitral annular calcification. Trivial mitral valve regurgitation. Severe mitral valve stenosis. MV peak gradient, 27.1 mmHg. The mean mitral valve gradient is 16.0 mmHg with average heart rate of 84 bpm. Tricuspid Valve: The tricuspid valve is normal in structure. Tricuspid valve regurgitation is trivial. No evidence of tricuspid stenosis. Aortic Valve: The aortic valve is grossly normal. There is mild calcification of the aortic valve. Aortic valve regurgitation is not visualized. Aortic valve sclerosis is present, with no evidence of aortic valve stenosis. Aortic valve mean gradient measures 8.0 mmHg. Aortic valve peak gradient measures 14.6 mmHg. Aortic valve area, by VTI measures 2.34 cm. Pulmonic Valve: The pulmonic valve was normal in structure. Pulmonic valve regurgitation is trivial. No evidence of pulmonic stenosis. Aorta: The aortic root is normal in size and structure. Venous: The inferior vena cava is normal in size with greater than 50% respiratory variability, suggesting right atrial pressure of 3 mmHg. IAS/Shunts: The atrial septum is  grossly normal.  LEFT VENTRICLE PLAX 2D LVIDd:         3.80 cm   Diastology LVIDs:         2.30 cm   LV e' medial:    7.40 cm/s LV PW:         1.50 cm   LV E/e' medial:  21.2 LV IVS:        0.90 cm   LV e' lateral:   9.79 cm/s LVOT diam:     2.20 cm   LV E/e' lateral: 16.0 LV SV:         78 LV SV Index:   37 LVOT Area:     3.80 cm  RIGHT VENTRICLE             IVC RV S prime:     13.70 cm/s  IVC diam: 1.80 cm TAPSE (M-mode): 3.0 cm LEFT ATRIUM             Index        RIGHT ATRIUM           Index LA diam:        4.30 cm 2.01 cm/m   RA Area:     15.20 cm LA Vol (A2C):   58.8 ml 27.49 ml/m  RA Volume:   34.70 ml  16.22 ml/m LA Vol (A4C):   75.1 ml 35.11 ml/m LA Biplane Vol: 68.0 ml 31.80 ml/m  AORTIC VALVE AV Area (Vmax):    2.13 cm AV Area (Vmean):   2.19 cm AV Area (VTI):     2.34 cm AV Vmax:           191.00 cm/s AV Vmean:          126.000 cm/s AV VTI:            0.335 m AV Peak Grad:      14.6 mmHg AV Mean Grad:  8.0 mmHg LVOT Vmax:         107.00 cm/s LVOT Vmean:        72.600 cm/s LVOT VTI:          0.206 m LVOT/AV VTI ratio: 0.61  AORTA Ao Root diam: 3.20 cm Ao Asc diam:  3.00 cm MITRAL VALVE                TRICUSPID VALVE MV Area (PHT): 2.22 cm     TR Peak grad:   20.8 mmHg MV Area VTI:   1.05 cm     TR Vmax:        228.00 cm/s MV Peak grad:  27.1 mmHg MV Mean grad:  16.0 mmHg    SHUNTS MV Vmax:       2.60 m/s     Systemic VTI:  0.21 m MV Vmean:      193.5 cm/s   Systemic Diam: 2.20 cm MV Decel Time: 342 msec MV E velocity: 157.00 cm/s MV A velocity: 185.00 cm/s MV E/A ratio:  0.85 Soyla Merck MD Electronically signed by Soyla Merck MD Signature Date/Time: 09/06/2024/1:12:10 PM    Final    CT Angio Chest/Abd/Pel for Dissection W and/or Wo Contrast Result Date: 09/05/2024 CLINICAL DATA:  Acute aortic syndrome, heart catheterization yesterday, chest pain, dizziness EXAM: CT ANGIOGRAPHY CHEST, ABDOMEN AND PELVIS TECHNIQUE: Non-contrast CT of the chest was initially obtained.  Multidetector CT imaging through the chest, abdomen and pelvis was performed using the standard protocol during bolus administration of intravenous contrast. Multiplanar reconstructed images and MIPs were obtained and reviewed to evaluate the vascular anatomy. RADIATION DOSE REDUCTION: This exam was performed according to the departmental dose-optimization program which includes automated exposure control, adjustment of the mA and/or kV according to patient size and/or use of iterative reconstruction technique. CONTRAST:  OMNIPAQUE  IOHEXOL  350 MG/ML SOLN COMPARISON:  09/05/2024, 06/22/2024 FINDINGS: CTA CHEST FINDINGS Cardiovascular: No evidence of thoracic aortic aneurysm or dissection. Heart is unremarkable without pericardial effusion. Large 5.0 x 3.3 cm calcification extends along the mitral valve, similar to prior exam. Stable mild calcification of the aortic valve. Atherosclerosis of the aorta and coronary vasculature. There is technically adequate opacification of the pulmonary vasculature. No filling defects or pulmonary emboli. Mediastinum/Nodes: No enlarged mediastinal, hilar, or axillary lymph nodes. Thyroid  gland, trachea, and esophagus demonstrate no significant findings. Lungs/Pleura: No acute airspace disease, effusion, or pneumothorax. Scattered areas of subsegmental atelectasis or scarring within the upper lobes. There is a 9 mm ground-glass nodule within the right lower lobe reference image 51/7, new since recent CT and therefore likely inflammatory or infectious. Central airways are patent. Musculoskeletal: No acute or destructive bony abnormalities. Reconstructed images demonstrate no additional findings. Review of the MIP images confirms the above findings. CTA ABDOMEN AND PELVIS FINDINGS VASCULAR Aorta: Normal caliber aorta without aneurysm, dissection, vasculitis or significant stenosis. Moderate atherosclerosis. Celiac: Patent without evidence of aneurysm, dissection, vasculitis or  significant stenosis. SMA: Patent without evidence of aneurysm, dissection, vasculitis or significant stenosis. Renals: Both renal arteries are patent without evidence of aneurysm, dissection, vasculitis, fibromuscular dysplasia or significant stenosis. Mild atherosclerosis at the origin of the renal arteries, left greater than right. IMA: Patent without evidence of aneurysm, dissection, vasculitis or significant stenosis. Inflow: Patent without evidence of aneurysm, dissection, vasculitis or significant stenosis. Veins: No obvious venous abnormality within the limitations of this arterial phase study. Review of the MIP images confirms the above findings. NON-VASCULAR Hepatobiliary: No focal liver abnormality is seen. No  gallstones, gallbladder wall thickening, or biliary dilatation. Pancreas: Unremarkable. No pancreatic ductal dilatation or surrounding inflammatory changes. Spleen: Normal in size without focal abnormality. Adrenals/Urinary Tract: Adrenals are stable. Kidneys enhance normally and symmetrically. No urinary tract calculi or obstructive uropathy. Bladder is unremarkable. Stomach/Bowel: No bowel obstruction or ileus. Normal appendix right lower quadrant. Colonic diverticulosis without evidence of acute diverticulitis. Lymphatic: No pathologic adenopathy. Reproductive: Status post hysterectomy. No adnexal masses. Other: No free fluid or free intraperitoneal gas. No abdominal wall hernia. Musculoskeletal: No acute or destructive bony abnormalities. Reconstructed images demonstrate no additional findings. Review of the MIP images confirms the above findings. IMPRESSION: Vascular: 1. No evidence of thoracoabdominal aortic aneurysm or dissection. 2. No evidence of pulmonary embolus. 3. Stable large amorphous calcification across the mitral valve. 4. Aortic Atherosclerosis (ICD10-I70.0). Coronary artery atherosclerosis. Nonvascular: 1. New 9 mm right lower lobe ground-glass nodule. Given the rapid development  since recent CT 06/2024, this is likely inflammatory or infectious. Initial follow-up with CT at 6 months is recommended to document resolution. If persistent, repeat CT is recommended every 2 years until 5 years of stability has been established. This recommendation follows the consensus statement: Guidelines for Management of Incidental Pulmonary Nodules Detected on CT Images: From the Fleischner Society 2017; Radiology 2017; 284:228-243. 2. No acute intra-abdominal or intrapelvic process. 3. Colonic diverticulosis without diverticulitis. Electronically Signed   By: Ozell Daring M.D.   On: 09/05/2024 19:57   DG Chest 2 View Result Date: 09/05/2024 CLINICAL DATA:  Chest pain. EXAM: CHEST - 2 VIEW COMPARISON:  Chest radiograph dated 07/19/2023. FINDINGS: Mild cardiomegaly with mild vascular congestion. No focal consolidation, pleural effusion pneumothorax or acute osseous pathology. IMPRESSION: Mild cardiomegaly with mild vascular congestion. Electronically Signed   By: Vanetta Chou M.D.   On: 09/05/2024 16:39   CARDIAC CATHETERIZATION Result Date: 09/04/2024 Images from the original result were not included.   Prox LAD to Mid LAD lesion is 25% stenosed with 25% stenosed side branch in 2nd Diag.  Mid LAD to Dist LAD lesion is 30% stenosed.   Ost Cx lesion is 40% stenosed.   Ost RCA to Prox RCA lesion is 40% stenosed. Prox RCA to Mid RCA lesion is 20% stenosed.   Hemodynamic findings consistent with severe pulmonary hypertension.   There is mild aortic valve stenosis.   There is moderate mitral valve stenosis. Dominance: Right POST-OPERATIVE DIAGNOSIS:  Angiographically minimal CAD with calcified minimal RCA disease over most 40% proximally, 30% eccentric LAD D1 and 20% D2 otherwise no CAD Severe Mixed Pulmonary Hypertension: Transpulmonary gradient near 20 with mean PAP 48 mmHg, PCWP 29 Miller mercury and LVEDP of 21 mmHg. => This is consistent with intrinsic pulmonary hypertension, LV diastolic  dysfunction and mitral stenosis. RAP mean 19 mm; RVP-EDP 70/11-19 mmHg; PAP-mean 69/30-48 mm Q; PCWP 29-30 mmHg. PVR 2.88. LVP-EDP 133/10 -21 mmHg; AOP-MAP 130/64-92 mmHg. Ao sat 91%, PA sat 55%.  Normal Fick Cardiac Output-Index: 6.6-3.09 PLAN   In the absence of any other complications or medical issues, we expect the patient to be ready for discharge from a cath perspective on 09/04/2024.   Plan is to titrate up her diuretic-increased from 40 mg in the morning to 80 mg.  (80 mg IV given in the PACU holding area); Continue to titrate GDMT for afterload reduction/treatment of diastolic heart failure. Continue to counsel the patient about decreasing salt intake. Continue home oxygen    Recommend Aspirin  81mg  daily for moderate CAD. Alm Clay, MD   Transfuse RBC  Date Action Dose Route User   09/18/2024 1130 Rate/Dose Change (none) Intravenous Kemp Chiquita BRAVO, RN   09/18/2024 1115 New Bag/Given (none) Intravenous Darra Katheryn LABOR, RN      Transfuse RBC     Date Action Dose Route User   09/18/2024 1518 Infusion Verify (none) Intravenous Darra Katheryn LABOR, RN   09/18/2024 1513 Infusion Verify (none) Intravenous Darra Katheryn LABOR, RN   09/18/2024 1330 Rate/Dose Change (none) Intravenous Sandie Norleen SAILOR, RN   09/18/2024 1320 New Bag/Given (none) Intravenous Claudene France, RN      diphenhydrAMINE  (BENADRYL ) capsule 25 mg     Date Action Dose Route User   09/18/2024 1042 Given 25 mg Oral Dorrie Blackbird I, RN      0.9 %  sodium chloride  infusion (Manually program via Guardrails IV Fluids)     Date Action Dose Route User   09/18/2024 1034 New Bag/Given 250 mL Intravenous Dorrie Blackbird I, RN          Latest Ref Rng & Units 10/31/2023   12:50 PM 12/14/2022   10:02 AM 01/14/2018   10:59 AM  PFT Results  FVC-Pre L 1.58  1.21  1.85   FVC-Predicted Pre % 55  42  77   FVC-Post L   1.87   FVC-Predicted Post %   78   Pre FEV1/FVC % % 80  84  85   Post FEV1/FCV % %   87   FEV1-Pre L 1.26  1.02  1.57    FEV1-Predicted Pre % 58  46  84   FEV1-Post L   1.62   DLCO uncorrected ml/min/mmHg 12.91  12.49  15.77   DLCO UNC% % 69  67  70   DLCO corrected ml/min/mmHg 12.91  12.49    DLCO COR %Predicted % 69  67    DLVA Predicted % 100  102  101   TLC L  2.81  3.66   TLC % Predicted %  58  75   RV % Predicted %  -307  81     No results found for: NITRICOXIDE      Assessment & Plan:   ILD (interstitial lung disease) (HCC) Unclear if changes on prior CT were related to edema, given RHC findings, or progression of ILD. Symptoms have improved with diuresis and management of her anemia. Will plan to set her up for repeat CT chest and PFT. Hold off on initiation of immunomodulators or antifbrotic at this point. PH appears primarily group I/II based on RHC results. Continue to optimize fluid status with cardiology. Graded exercises encouraged.  Patient Instructions  Continue Albuterol  inhaler 2 puffs or duoneb 3 mL neb every 6 hours as needed for shortness of breath or wheezing. Notify if symptoms persist despite rescue inhaler/neb use.  Continue zytrec 1 tab daily Continue pantoprazole  40 mg daily  Continue supplemental oxygen  3 lpm as needed to maintain oxygen  >88-90%  Follow up with Ear, Nose and Throat as scheduled Follow up with cardiology as scheduled  Your right heart catheterization found elevated pulmonary artery pressures related to your heart and valve disease. Sounds like things have improved with managing your fluid status plus controlling the nose bleeds Make sure you also follow up with hematology for monitoring your blood counts  There wasn't any significant worsening on your last lung function testing and you actually had, had some improvement in your lung function on it. It's possible the changes on your prior CT were related to inflammation and/or fluid.  You did have a repeat CT chest scan in November that showed a new lung nodule. They felt as though this was related to a  possible pneumonia. We will plan to repeat your CT chest in mid January for 8 week follow up and repeat lung function testing around the same time.   I am glad you are feeling better. Keep a close eye on things   Continue fluid medications as prescribed by your heart doctor and monitor your weights. If you gain 2-3 lb overnight or 5-7 lb in a week, or you have increased swelling, let them know    Follow up in 8 weeks after spiro/DLCO and repeat CT chest with Dr. Geronimo (ILD slot). If symptoms do not improve or worsen, please contact office for sooner follow up or seek emergency care.   Chronic respiratory failure with hypoxia (HCC) Improved oxygen  requirements without any recent hypoxia. Advised to continue to utilize and monitor for goal >88-90%  CAP (community acquired pneumonia) Recent admission for possible pna, treated with empiric abx. CT chest with new 9 mm nodule in RLL, felt to be infectious. Will repeat CT chest in mid January for 6-8 week follow up to ensure resolution and reassess ILD.   Pulmonary hypertension, unspecified (HCC) See above. Elevated wedge pressure and LVEDP on RHC. Consistent with intrinsic PH, LV diastolic dysfunction and mitral stenosis. Improved dyspnea and chest pressure with adjustment in diuretic regimen. Follow up with cardiology as scheduled  Chronic diastolic CHF (congestive heart failure) (HCC) Euvolemic on exam. See above  Mitral stenosis Severe MS. High risk for surgery. Dr. Laurence willing to consider if HF symptoms remain poorly controlled per cardiology's last note. Follow up with cardiology as scheduled.   IDA (iron deficiency anemia) Anemia felt to be related to recurrent epistaxis. No recent episodes. Eliquis  currently on hold. Awaiting f/u with ENT. Aware of ED precautions. Recent hgb improved at 9.5, which we discussed is likely still contributing to DOE but improved from prior. Continue to monitor.    I spent 45 minutes of dedicated to the  care of this patient on the date of this encounter to include pre-visit review of records, face-to-face time with the patient discussing conditions above, post visit ordering of testing, clinical documentation with the electronic health record, making appropriate referrals as documented, and communicating necessary findings to members of the patients care team.  Comer LULLA Rouleau, NP 09/24/2024  Pt aware and understands NP's role.

## 2024-09-24 NOTE — Assessment & Plan Note (Signed)
 Recent admission for possible pna, treated with empiric abx. CT chest with new 9 mm nodule in RLL, felt to be infectious. Will repeat CT chest in mid January for 6-8 week follow up to ensure resolution and reassess ILD.

## 2024-09-24 NOTE — Assessment & Plan Note (Signed)
 Improved oxygen  requirements without any recent hypoxia. Advised to continue to utilize and monitor for goal >88-90%

## 2024-09-24 NOTE — Assessment & Plan Note (Signed)
 Anemia felt to be related to recurrent epistaxis. No recent episodes. Eliquis  currently on hold. Awaiting f/u with ENT. Aware of ED precautions. Recent hgb improved at 9.5, which we discussed is likely still contributing to DOE but improved from prior. Continue to monitor.

## 2024-09-24 NOTE — Patient Instructions (Addendum)
 Continue Albuterol  inhaler 2 puffs or duoneb 3 mL neb every 6 hours as needed for shortness of breath or wheezing. Notify if symptoms persist despite rescue inhaler/neb use.  Continue zytrec 1 tab daily Continue pantoprazole  40 mg daily  Continue supplemental oxygen  3 lpm as needed to maintain oxygen  >88-90%  Follow up with Ear, Nose and Throat as scheduled Follow up with cardiology as scheduled  Your right heart catheterization found elevated pulmonary artery pressures related to your heart and valve disease. Sounds like things have improved with managing your fluid status plus controlling the nose bleeds Make sure you also follow up with hematology for monitoring your blood counts  There wasn't any significant worsening on your last lung function testing and you actually had, had some improvement in your lung function on it. It's possible the changes on your prior CT were related to inflammation and/or fluid. You did have a repeat CT chest scan in November that showed a new lung nodule. They felt as though this was related to a possible pneumonia. We will plan to repeat your CT chest in mid January for 8 week follow up and repeat lung function testing around the same time.   I am glad you are feeling better. Keep a close eye on things   Continue fluid medications as prescribed by your heart doctor and monitor your weights. If you gain 2-3 lb overnight or 5-7 lb in a week, or you have increased swelling, let them know    Follow up in 8 weeks after spiro/DLCO and repeat CT chest with Dr. Geronimo (ILD slot). If symptoms do not improve or worsen, please contact office for sooner follow up or seek emergency care.

## 2024-09-24 NOTE — Assessment & Plan Note (Signed)
 Severe MS. High risk for surgery. Dr. Laurence willing to consider if HF symptoms remain poorly controlled per cardiology's last note. Follow up with cardiology as scheduled.

## 2024-09-24 NOTE — Assessment & Plan Note (Signed)
 Euvolemic on exam. See above

## 2024-09-24 NOTE — Assessment & Plan Note (Signed)
 See above. Elevated wedge pressure and LVEDP on RHC. Consistent with intrinsic PH, LV diastolic dysfunction and mitral stenosis. Improved dyspnea and chest pressure with adjustment in diuretic regimen. Follow up with cardiology as scheduled

## 2024-09-24 NOTE — Assessment & Plan Note (Signed)
 Unclear if changes on prior CT were related to edema, given RHC findings, or progression of ILD. Symptoms have improved with diuresis and management of her anemia. Will plan to set her up for repeat CT chest and PFT. Hold off on initiation of immunomodulators or antifbrotic at this point. PH appears primarily group I/II based on RHC results. Continue to optimize fluid status with cardiology. Graded exercises encouraged.  Patient Instructions  Continue Albuterol  inhaler 2 puffs or duoneb 3 mL neb every 6 hours as needed for shortness of breath or wheezing. Notify if symptoms persist despite rescue inhaler/neb use.  Continue zytrec 1 tab daily Continue pantoprazole  40 mg daily  Continue supplemental oxygen  3 lpm as needed to maintain oxygen  >88-90%  Follow up with Ear, Nose and Throat as scheduled Follow up with cardiology as scheduled  Your right heart catheterization found elevated pulmonary artery pressures related to your heart and valve disease. Sounds like things have improved with managing your fluid status plus controlling the nose bleeds Make sure you also follow up with hematology for monitoring your blood counts  There wasn't any significant worsening on your last lung function testing and you actually had, had some improvement in your lung function on it. It's possible the changes on your prior CT were related to inflammation and/or fluid. You did have a repeat CT chest scan in November that showed a new lung nodule. They felt as though this was related to a possible pneumonia. We will plan to repeat your CT chest in mid January for 8 week follow up and repeat lung function testing around the same time.   I am glad you are feeling better. Keep a close eye on things   Continue fluid medications as prescribed by your heart doctor and monitor your weights. If you gain 2-3 lb overnight or 5-7 lb in a week, or you have increased swelling, let them know    Follow up in 8 weeks after  spiro/DLCO and repeat CT chest with Dr. Geronimo (ILD slot). If symptoms do not improve or worsen, please contact office for sooner follow up or seek emergency care.

## 2024-09-25 ENCOUNTER — Encounter: Payer: Self-pay | Admitting: Family

## 2024-09-28 NOTE — Addendum Note (Signed)
 Addended by: Sharlena Kristensen V on: 09/28/2024 11:02 AM   Modules accepted: Level of Service

## 2024-09-29 ENCOUNTER — Telehealth: Payer: Self-pay

## 2024-09-29 ENCOUNTER — Ambulatory Visit: Admitting: Nurse Practitioner

## 2024-09-29 NOTE — Telephone Encounter (Signed)
 Copied from CRM #[id number]. Topic: Clinical - Request for Lab/Test Order >> Sep 29, 2024 11:27 AM [name]: Reason for CRM: patient states she has a CT scan scheduled for 11/05/24 but she states had one done at the hospital on 09/04/24 and wanted to know if provider can use that CT instead of having another one done - she is requesting call back with further instructions  [name] can you clarify if you would like pt to have CT in January

## 2024-09-30 ENCOUNTER — Telehealth: Payer: Self-pay | Admitting: Family

## 2024-09-30 NOTE — Telephone Encounter (Signed)
 Called pt to schedule infusion per inbasket. Pt declined scheduling as she wanted to try oral iron first and would see how her iron levels looked when she came back in Jan.

## 2024-09-30 NOTE — Telephone Encounter (Signed)
 She needs repeat. Thanks.

## 2024-10-01 NOTE — Telephone Encounter (Signed)
 ATC x1. LDVM (DPR) stating pt needs to keep CT scheduled for 11/06/2023.

## 2024-10-12 ENCOUNTER — Other Ambulatory Visit: Payer: Self-pay | Admitting: Family Medicine

## 2024-10-12 DIAGNOSIS — F410 Panic disorder [episodic paroxysmal anxiety] without agoraphobia: Secondary | ICD-10-CM

## 2024-10-13 ENCOUNTER — Ambulatory Visit (INDEPENDENT_AMBULATORY_CARE_PROVIDER_SITE_OTHER): Admitting: Otolaryngology

## 2024-10-13 ENCOUNTER — Encounter (INDEPENDENT_AMBULATORY_CARE_PROVIDER_SITE_OTHER): Payer: Self-pay | Admitting: Otolaryngology

## 2024-10-13 VITALS — BP 135/80 | HR 88 | Ht 63.0 in | Wt 254.0 lb

## 2024-10-13 DIAGNOSIS — R040 Epistaxis: Secondary | ICD-10-CM | POA: Diagnosis not present

## 2024-10-13 DIAGNOSIS — Z9981 Dependence on supplemental oxygen: Secondary | ICD-10-CM

## 2024-10-13 DIAGNOSIS — Z7901 Long term (current) use of anticoagulants: Secondary | ICD-10-CM | POA: Diagnosis not present

## 2024-10-13 DIAGNOSIS — R0981 Nasal congestion: Secondary | ICD-10-CM

## 2024-10-13 MED ORDER — MUPIROCIN 2 % EX OINT: 1.0000 | TOPICAL_OINTMENT | Freq: Four times a day (QID) | CUTANEOUS | 2 refills | Status: AC

## 2024-10-13 MED ORDER — SALINE SPRAY 0.65 % NA SOLN: 2.0000 | NASAL | 4 refills | Status: AC | PRN

## 2024-10-13 NOTE — Progress Notes (Signed)
 Dear Dr. Watt, Here is my assessment for our mutual patient, Lynn [name]. Thank you for allowing me the opportunity to care for your patient. Please do not hesitate to contact me should you have any other questions. Sincerely, Dr. Eldora Blanch  Otolaryngology Clinic Note  HISTORY: Lynn [name] is a 68 y.o. female kindly referred for epistaxis  Initial visit (09/2024): Discussed the use of AI scribe software for clinical note transcription with the patient, who gave verbal consent to proceed.  History of Present Illness Lynn [name] is a 68 year old female with recurrent epistaxis and cardiac comorbidities requiring anticoagulation who presents for evaluation of severe, persistent epistaxis and nasal mucosal dryness.  She has had recurrent severe epistaxis for 2-3 years with worsening over the past month. Bleeding is mainly from the left nasal cavity with overflow to the right. Episodes occur up to twice weekly, last 1-2 hours, and produce large clots. Over the past month she has required blood transfusions for bleeding. Her last severe episode was three weeks ago, after which anticoagulation was stopped for the past 2-3 weeks. Since stopping anticoagulation she has had no further severe bleeds but continues daily minor bleeding, especially at night, with crusting, hardening, and persistent clots.  She has intermittent nasal congestion, nasal obstruction. She denies frequent sinus infections or CRS symptoms  She had office-based nasal cautery last year without significant relief. She applies Vaseline to the nose intermittently, which sometimes increases rhinorrhea and leads her to blow her nose. She has not used other topical therapies regularly. She remains off anticoagulation due to bleeding.   She is on O2 24/7; last heavy bleed was about 3 weeks ago. Nose does feel dry --- she is using vaseline (not regularly)  CKD/Liver dysfunction: yes Anticoagulation/AP:  yes Trauma: no History of Sinusitis: no Nasal obstruction: no Current nasal medication use: nothing besides vaseline  AP/AC: yes  Tobacco: former  PMHx: COPD on O2, A-fib on Eliquis , HTN, DM, CAD, CHF, ILD, RA, Anemia, Mitral stenosis  RADIOGRAPHIC EVALUATION AND INDEPENDENT REVIEW OF OTHER RECORDS:: ED notes Richerd Later (09/11/2024): noted epistaxis; called EMS, afrin controlled it. Noted friable tissue b/l kiesselbach's plexus; rec ENT follow up.  Dr. Watt (09/16/2024): noted pulm HTN, recurrent epistaxis including on 09/16/2024. Dx: Epistaxis; Rx: ref to ENT CBC 09/23/2024: Hgb 9.5, Plt 269; CMP 09/23/2024: BUN/Cr 13/1.11; LFT wnl [place]: noted left epistaxis, performed cautery on left; recommend saline spray; on eliquis   Past Medical History:  Diagnosis Date   Allergy 2018   Anxiety    CHF (congestive heart failure) (HCC)    Clotting disorder    blood clot in eye 2016   COPD (chronic obstructive pulmonary disease) (HCC)    Coronary artery disease, non-occlusive    a. 11/2016 NSTEMI/Cath: LM nl, LAD 40p, 50md, D1/2 small, LCX 40ost, OM2/3 nl/small, RCA 35 ost/mid, RPDA/RPL small/nl.   Coronary vasospasm    Depression    Diastolic dysfunction    a. 11/2016 Echo: EF 55-60%, gr1 DD, sev calcified MV annulus, mildly dil LA.   Eye hemorrhage 01/2015   right; resolved (07/03/2017)   Gastric ulcer    GERD (gastroesophageal reflux disease)    Headache    monthly (07/03/2017)   Heart murmur    History of blood transfusion    when I had an ectopic pregnancy   History of stomach ulcers    Hyperlipidemia    Hypertension    Microcytic anemia    Moderate mitral stenosis by prior echocardiogram 03/2021  TTE: Mod MS (mean gradient 9 mmHg). -- TEE Moderate-Severe MS (mean gradient 14 mmHg) with fixed Post MV Leaflet & Severe MAC w/ large circumscribed calcified mass on the Post MV Leaflet. (Mass previously described in 2018) - CMRI identifies calcified mass & not Tumor.;  R&LHC - LVEDP & PCWP both 23 mmHg indicating minimal resting gradient   Myocardial infarction (HCC) 2023   OSA on CPAP    setting is unknown   Oxygen  deficiency    PAF (paroxysmal atrial fibrillation) (HCC) 03/2021   Event Monitor Aug-Sept 2022: Predominantly sinus rhythm.  1% A. fib burden.  30 brief episodes of PAT.  2 short bursts of NSVT.   PAT (paroxysmal atrial tachycardia)    Event monitor from August 2022 showed 30 brief bursts longest 14 seconds.   Pneumonia    several times (07/03/2017)   PVC's (premature ventricular contractions)    Rheumatoid arthritis (HCC)    Sickle cell anemia (HCC)    Sleep apnea    Stroke [facility])    Syncope 07/03/2017 X 2   called seizures but no medications   Thalassemia    my cells are sickle cell shaped but I don't have sickle cell anemia (07/03/2017)   Type II diabetes mellitus (HCC)    Past Surgical History:  Procedure Laterality Date   48-Hour Monitor  03/18/2017   Sinus rhythm with sinus tachycardia (rate 58-134 BPM)multiple PVCs noted with couplets and bigeminy. One triplet. 7 runs of PAT ranging from 100-130 bpm. Longest was 33 beats.   BALLOON ENTEROSCOPY N/A 08/13/2023   Procedure: BALLOON ENTEROSCOPY;  Surgeon: San Sandor GAILS, DO;  Location: [facility];  Service: Gastroenterology;  Laterality: N/A;   BIOPSY  04/03/2021   Procedure: BIOPSY;  Surgeon: Leigh Elspeth SQUIBB, MD;  Location: [facility];  Service: Gastroenterology;;   BIOPSY  12/17/2022   Procedure: BIOPSY;  Surgeon: Abran Norleen SAILOR, MD;  Location: [facility];  Service: Gastroenterology;;   BREAST BIOPSY Left    BRONCHIAL WASHINGS  03/28/2021   Procedure: BRONCHIAL WASHINGS;  Surgeon: Gretta Leita SQUIBB, DO;  Location: [facility];  Service: Endoscopy;;   CARDIAC CATHETERIZATION N/A 11/19/2016   Procedure: Left Heart Cath and Coronary Angiography;  Surgeon: Victory LELON Sharps, MD;  Location: [facility] INVASIVE CV LAB;  Service: Cardiovascular.    LM nl, LAD 40p, 50md, D1/2 small, LCX  40ost, OM2/3 nl/small, RCA 35 ost/mid, RPDA/RPL small/nl.   CARDIAC MRI  03/30/2021   Normal LVEF ~53%. 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 No Scar or Late Gadolinium Enhancement on LV Myocardium.   CARPAL TUNNEL RELEASE Right 11/01/2014   COLONOSCOPY     DILATION AND CURETTAGE OF UTERUS     ECTOPIC PREGNANCY SURGERY  X 2   ENTEROSCOPY N/A 09/05/2022   Procedure: ENTEROSCOPY;  Surgeon: Wilhelmenia Aloha Raddle., MD;  Location: [facility];  Service: Gastroenterology;  Laterality: N/A;   ENTEROSCOPY N/A 07/17/2023   Procedure: ENTEROSCOPY;  Surgeon: Abran Norleen SAILOR, MD;  Location: [facility];  Service: Gastroenterology;  Laterality: N/A;   ESOPHAGOGASTRODUODENOSCOPY N/A 12/17/2022   Procedure: ESOPHAGOGASTRODUODENOSCOPY (EGD);  Surgeon: Abran Norleen SAILOR, MD;  Location: [facility];  Service: Gastroenterology;  Laterality: N/A;   ESOPHAGOGASTRODUODENOSCOPY (EGD) WITH PROPOFOL  N/A 04/03/2021   Procedure: ESOPHAGOGASTRODUODENOSCOPY (EGD) WITH PROPOFOL ;  Surgeon: Leigh Elspeth SQUIBB, MD;  Location: [facility];  Service: Gastroenterology;  Laterality: N/A;   ESOPHAGOGASTRODUODENOSCOPY (EGD) WITH PROPOFOL  N/A 08/15/2021   Procedure: ESOPHAGOGASTRODUODENOSCOPY (EGD) WITH PROPOFOL ;  Surgeon: Aneita Gist  T, MD;  Location: [facility];  Service: Endoscopy;  Laterality: N/A;   GIVENS CAPSULE STUDY N/A 07/17/2023   Procedure: GIVENS CAPSULE STUDY;  Surgeon: Abran Norleen SAILOR, MD;  Location: [facility];  Service: Gastroenterology;  Laterality: N/A;   HOT HEMOSTASIS N/A 09/05/2022   Procedure: HOT HEMOSTASIS (ARGON PLASMA COAGULATION/BICAP);  Surgeon: Wilhelmenia Aloha Raddle., MD;  Location: [facility];  Service: Gastroenterology;  Laterality: N/A;   HOT HEMOSTASIS N/A 08/13/2023   Procedure: HOT HEMOSTASIS (ARGON PLASMA COAGULATION/BICAP);  Surgeon: San Sandor GAILS, DO;  Location: [facility];  Service: Gastroenterology;  Laterality: N/A;   JOINT  REPLACEMENT     KNEE ARTHROSCOPY Left 11/2014   RIGHT/LEFT HEART CATH AND CORONARY ANGIOGRAPHY N/A 08/08/2021   Procedure: RIGHT/LEFT HEART CATH AND CORONARY ANGIOGRAPHY;  Surgeon: [name], MD;  Location: [facility] INVASIVE CV LAB;  Service: Cardiovascular;Ost LCx 40%. Ost-Prox RCA 35%. Prox-Mid LAD 25%-<25% D2. Mod-Severely Elevated LVEDP. Mild PA HTN -> PA mean 31 mmHg with a PCWP and LVEDP of 23 mmHg   RIGHT/LEFT HEART CATH AND CORONARY ANGIOGRAPHY N/A 09/04/2024   Procedure: RIGHT/LEFT HEART CATH AND CORONARY ANGIOGRAPHY;  Surgeon: [name], MD;  Location: [facility] INVASIVE CV LAB;  Service: Cardiovascular;  Laterality: N/A;   SUBMUCOSAL TATTOO INJECTION  09/05/2022   Procedure: SUBMUCOSAL TATTOO INJECTION;  Surgeon: Wilhelmenia Aloha Raddle., MD;  Location: [facility];  Service: Gastroenterology;;   SUBMUCOSAL TATTOO INJECTION  07/17/2023   Procedure: SUBMUCOSAL TATTOO INJECTION;  Surgeon: Abran Norleen SAILOR, MD;  Location: [facility];  Service: Gastroenterology;;   TEE WITHOUT CARDIOVERSION N/A 03/28/2021   Procedure: TRANSESOPHAGEAL ECHOCARDIOGRAM (TEE);  Surgeon: Alveta Aleene PARAS, MD;  Location: [facility] ENDOSCOPY;; LVEF 55-60%. Normal LV Fxn & no RWMA. No LAA thrombus. Gr II Ao Plaque. Large circumscribed calcific mass (1.8 x 1.4 cm) anterior to posterior annulus.  Fixed posterior leaflet with Mod-Severe MS (Mean MVG ~14 mmHg, Peak 20 mmhg).  Mild MR   TONSILLECTOMY     TOTAL KNEE ARTHROPLASTY Right 02/18/2015   Procedure: RIGHT TOTAL KNEE ARTHROPLASTY;  Surgeon: Marcey Her, MD;  Location: [facility] OR;  Service: Orthopedics;  Laterality: Right;   TOTAL KNEE ARTHROPLASTY Left 08/19/2015   Procedure: LEFT TOTAL KNEE ARTHROPLASTY;  Surgeon: Marcey Her, MD;  Location: [facility] OR;  Service: Orthopedics;  Laterality: Left;   TRANSTHORACIC ECHOCARDIOGRAM  11/2016; 07/2017:   a) normal LV size, thickness and function. EF 55-60%. GR 1 DD.No RWMA severe mitral annular calcification, but no mitral stenosis. Mild  LA dilation;; b) normal LV size and function.  EF 65-75%.  Unable to assess diastolic function.  Aortic sclerosis with no stenosis.  Moderate mitral stenosis? (Gradient 9 mmHg) -?  Mild-mod left atrial enlargement    TRANSTHORACIC ECHOCARDIOGRAM  01/2019   EF 65 to 70%.  Normal function.  Elevated LVEDP-GR 1 DD.  Normal RV size and function.  Large calcific mass on posterior mitral leaflet mild to moderate mitral stenosis.   TRANSTHORACIC ECHOCARDIOGRAM  03/24/2021   EF 60 to 65%.  LV function with normal wall motion.  Moderate basal septal LVH.  GRII DD & Mild LA dilation.-> elevated LVEDP.  Normal RV function.  Mildly elevated PAP.  Ill-defined density measuring 3.23 x 2.1 cm region of the posterior MV leaflet along with dense MAC.  Moderate MS (mean MVG of 9 mmHg.) Normal AoV & RAP. --> Recommend TEE 2/2 concern for MV SBE.   TRANSTHORACIC ECHOCARDIOGRAM  04/26/2022   EF 60 to 65%.  No RWMA.  Moderate LVH  with GR 1 DD.  Normal RV size and function.  Mild LA dilation.  Large calcific mass of posterior MV leaflet.  Mean PG 13 mm 3.  Trivial MR.  Mild to moderate MS.  Severe MAC.  Normal AOV.  Normal RAP.   VAGINAL HYSTERECTOMY     VIDEO BRONCHOSCOPY N/A 03/28/2021   Procedure: VIDEO BRONCHOSCOPY WITHOUT FLUORO;  Surgeon: Gretta Leita SQUIBB, DO;  Location: [facility] ENDOSCOPY;  Service: Endoscopy;  Laterality: N/A;   Family History  Problem Relation Age of Onset   Cancer Mother    Diabetes Mother    Hypertension Mother    Hyperlipidemia Mother    Cancer Father    Hyperlipidemia Father    Mental illness Sister    Diabetes Sister    Diabetes Maternal Grandmother    Diabetes Maternal Grandfather    Colon cancer Neg Hx    Esophageal cancer Neg Hx    Stomach cancer Neg Hx    Rectal cancer Neg Hx    Tremor Neg Hx    Parkinson's disease Neg Hx    Social History   Tobacco Use   Smoking status: Former    Current packs/day: 0.00    Average packs/day: 0.3 packs/day for 44.0 years (11.0 ttl pk-yrs)     Types: Cigarettes    Start date: 02/17/1971    Quit date: 02/17/2015    Years since quitting: 9.6   Smokeless tobacco: Never  Substance Use Topics   Alcohol use: Not Currently    Comment: 07/03/2017 might have a few drinks/year   Allergies[1] Current Outpatient Medications  Medication Sig Dispense Refill   acetaminophen  (TYLENOL ) 500 MG tablet Take 2,000 mg by mouth daily at 12 noon.     albuterol  (PROVENTIL ) (2.5 MG/3ML) 0.083% nebulizer solution INHALE 3 ML BY NEBULIZATION EVERY 6 HOURS AS NEEDED FOR WHEEZING OR SHORTNESS OF BREATH (Patient taking differently: Take 2.5 mg by nebulization 2 (two) times daily.) 150 mL 7   albuterol  (VENTOLIN  HFA) 108 (90 Base) MCG/ACT inhaler TAKE 2 PUFFS BY MOUTH EVERY 6 HOURS AS NEEDED FOR WHEEZE OR SHORTNESS OF BREATH 8.5 each 2   ALPRAZolam  (XANAX ) 0.25 MG tablet TAKE 1-2 TABLETS BY MOUTH 2 TIMES DAILY AS NEEDED FOR ANXIETY OR INSOMNIA 60 tablet 3   apixaban  (ELIQUIS ) 5 MG TABS tablet Take 1 tablet (5 mg total) by mouth 2 (two) times daily. 180 tablet 3   atorvastatin  (LIPITOR ) 80 MG tablet Take 1 tablet (80 mg total) by mouth every evening. 90 tablet 3   cetirizine (ZYRTEC) 10 MG tablet Take 10 mg by mouth daily as needed for allergies.     Continuous Glucose Sensor (DEXCOM G7 SENSOR) MISC 1 Device by Does not apply route as directed. 9 each 3   cyanocobalamin  (VITAMIN B12) 1000 MCG tablet Take 1 tablet (1,000 mcg total) by mouth daily. 30 tablet 0   diltiazem  (CARDIZEM  CD) 360 MG 24 hr capsule Take 1 capsule (360 mg total) by mouth daily. 90 capsule 3   diphenhydramine -acetaminophen  (TYLENOL  PM) 25-500 MG TABS tablet Take 2 tablets by mouth at bedtime.     ferrous sulfate  325 (65 FE) MG EC tablet Take 1 tablet (325 mg total) by mouth daily with breakfast. 60 tablet 3   folic acid  (FOLVITE ) 800 MCG tablet Take 800 mcg by mouth every other day.     furosemide  (LASIX ) 40 MG tablet Take 80 mg by mouth in the morning and an additional 40 mg for an overnight  weight gain of  3 pounds or 5 pounds in a week 135 tablet 3   gabapentin  (NEURONTIN ) 100 MG capsule Take 2 capsules (200 mg total) by mouth at bedtime. 60 capsule 0   glucose blood (CONTOUR NEXT TEST) test strip 3x daily 300 each 11   guaiFENesin  (ROBITUSSIN) 100 MG/5ML liquid Take 5 mLs by mouth every 4 (four) hours as needed for cough or to loosen phlegm. 120 mL 0   hydroxychloroquine  (PLAQUENIL ) 200 MG tablet Take 200 mg by mouth at bedtime.     Incontinence Supply Disposable MISC Please dispense Because Overnight Underwear Xl 100 each 4   Insulin  Disposable Pump (OMNIPOD 5 G7 PODS, GEN 5,) MISC 1 Device by Other route every other day. 45 each 3   insulin  lispro (HUMALOG ) 100 UNIT/ML injection Max Daily 70 units per pump 70 mL 3   ipratropium-albuterol  (DUONEB) 0.5-2.5 (3) MG/3ML SOLN Take 3 mLs by nebulization 2 (two) times daily. 360 mL 0   isosorbide  mononitrate (IMDUR ) 30 MG 24 hr tablet Take 1 tablet (30 mg total) by mouth daily. 90 tablet 3   Lancets 30G MISC 1 Device by Does not apply route 3 (three) times daily. 300 each 9   losartan  (COZAAR ) 100 MG tablet Take 0.5 tablets (50 mg total) by mouth daily. 45 tablet 3   melatonin 5 MG TABS Take 20 mg by mouth at bedtime.     Multiple Vitamins-Minerals (HM MULTIVITAMIN ADULT GUMMY PO) Take 2 each by mouth daily.     mupirocin ointment (BACTROBAN) 2 % Apply 1 Application topically 4 (four) times daily for 14 days. 22 g 2   nitroGLYCERIN  (NITROSTAT ) 0.4 MG SL tablet Place 1 tablet (0.4 mg total) under the tongue every 5 (five) minutes as needed for chest pain. 25 tablet 3   OXYGEN  Inhale 3 L/min into the lungs continuous.     oxymetazoline  (AFRIN NASAL SPRAY) 0.05 % nasal spray Place 1 spray into both nostrils 2 (two) times daily as needed for congestion (Nosebleed). 30 mL 0   Oxymetazoline  HCl (VICKS SINEX NA) Place 1 spray into both nostrils every 12 (twelve) hours as needed (for congestion- MAX OF THREE CONSECUTIVE DAYS).     pantoprazole   (PROTONIX ) 40 MG tablet Take 1 tablet (40 mg total) by mouth daily before breakfast. (Patient taking differently: Take 40 mg by mouth daily as needed (indigestion).) 90 tablet 3   spironolactone  (ALDACTONE ) 25 MG tablet Take 1 tablet (25 mg total) by mouth daily. 90 tablet 3   tirzepatide  (MOUNJARO ) 12.5 MG/0.5ML Pen Inject 12.5 mg into the skin once a week.     tirzepatide  (MOUNJARO ) 15 MG/0.5ML Pen Inject 15 mg into the skin once a week. 6 mL 3   traZODone  (DESYREL ) 50 MG tablet Take 0.5-1 tablets (25-50 mg total) by mouth at bedtime as needed for sleep. Needs appt (Patient taking differently: Take 50-100 mg by mouth at bedtime as needed for sleep. Needs appt) 90 tablet 3   sodium chloride  (OCEAN) 0.65 % SOLN nasal spray Place 2 sprays into both nostrils every 2 (two) hours as needed for congestion. 30 mL 4   No current facility-administered medications for this visit.   BP 135/80 (BP Location: Left Arm, Patient Position: Sitting, Cuff Size: Large)   Pulse 88   Ht 5' 3 (1.6 m)   Wt 254 lb (115.2 kg)   LMP  (LMP Unknown)   SpO2 (!) 78%   BMI 44.99 kg/m   PHYSICAL EXAM:  BP 135/80 (BP Location: Left Arm,  Patient Position: Sitting, Cuff Size: Large)   Pulse 88   Ht 5' 3 (1.6 m)   Wt 254 lb (115.2 kg)   LMP  (LMP Unknown)   SpO2 (!) 78%   BMI 44.99 kg/m    Salient findings:  CN II-XII intact Bilateral EAC clear and TM intact with well pneumatized middle ear spaces Nose: Anterior rhinoscopy reveals septum relatively midline, bilateral ITH; nasal mucosa is very dry and friable.  Nasal endoscopy was indicated to better evaluate the nose and paranasal sinuses, given the patient's history and exam findings, and is detailed below. No lesions of oral cavity/oropharynx No obviously palpable neck masses/lymphadenopathy/thyromegaly No respiratory distress or stridor On Nasal Cannula   PROCEDURE:  Prior to initiating any procedures, risks/benefits/alternatives were explained to the patient  and verbal consent obtained. PROCEDURE: Bilateral Rigid Nasal Endoscopy with endoscopic control of Left sided epistaxis (CPT [phone]) Pre-procedure diagnosis: Epistaxis Post-procedure diagnosis: same Indication: See pre-procedure diagnosis and physical exam above Complications: None apparent EBL: minimal mL Anesthesia: Lidocaine  4% and topical decongestant was topically sprayed in each nasal cavity  Description of Procedure:  Patient was identified as correct patient. Verbal consent obtained. Afrin/lidocaine  mix was sprayed into the nose in both nasal cavities. Subsequently, a headlight and speculum were used and there were prominent vessels were noted over left septum and friable mucosa but I was unable to determine the posterior-most extent of vessels using the headlight/speculum. Therefore, nasal endoscopy was necessary to evaluate and control the epistaxis.  A rigid 30 degree endoscope was utilized to evaluate the sinonasal cavities, mucosa, sinus ostia and turbinates and septum bilaterally. On the left, there were findings above but otherwise no active bleeding was noted. On the contralateral side, the nasal mucosa was quite dry but I could not see any prominent vessels; We discussed options including humidification, v/s cautery and R/B/A and patient/caregiver opted for cauterization. Given lack of complete visualization of the posterior portion of the vessel extent, decision was performed to perform cauterization after consent. Using the endoscope, the areas of suspected bleeding were visualized and then spot cauterized on left septum using silver nitrate cautery.  No bleeding was seen. Small amount of surgicel was applied to the septum over cauterized area and mupirocin ointment was applied to both nares. Patient tolerated the procedure well.  ASSESSMENT:  68 y.o. with:  1. Recurrent epistaxis   2. Anticoagulated   3. Nasal congestion   4. History of home oxygen  therapy    Multifactorial  etiology with nasal dryness from O2 and nose blowing and anticoagulation contributing. Area of left suspected bleeding source cauterized but without humidification, suspect her bleeding with recur.  PLAN: We've discussed issues and options today.  We reviewed the nasal endoscopy images together.  The risks, benefits and alternatives were discussed and questions answered.  She has elected to proceed with:  1) Mupirocin ointment QID x2 weeks 2) NO nose blowing 3) Recommend nasal saline spray every 1-2 hours while awake and before bed 4) Epistaxis precautions discussed - Ok to resume AP/AC in 3 days if stable - Follow-up in 6 weeks -- sooner as necessary.  See below regarding exact medications prescribed this encounter including dosages and route: Meds ordered this encounter  Medications   mupirocin ointment (BACTROBAN) 2 %    Sig: Apply 1 Application topically 4 (four) times daily for 14 days.    Dispense:  22 g    Refill:  2   sodium chloride  (OCEAN) 0.65 % SOLN nasal spray  Sig: Place 2 sprays into both nostrils every 2 (two) hours as needed for congestion.    Dispense:  30 mL    Refill:  4     Thank you for allowing me the opportunity to care for your patient. Please do not hesitate to contact me should you have any other questions.  Sincerely, Eldora Blanch, MD Otolaryngologist (ENT), [facility] Phone: [phone] Fax: [phone]  MDM:  [phone] Complexity/Problems addressed: mod - chronic problem with exacerbation Data complexity: mod - independent review of notes, labs - Morbidity: mod  - Prescription Drug prescribed or managed: y  10/13/2024, 12:14 PM      [1]  Allergies Allergen Reactions   Methotrexate Other (See Comments)    Pneumonitis   Penicillins Hives    Childhood allergy    Farxiga  [Dapagliflozin ] Other (See Comments)    Dizzy and lethargic    Metformin  And Related Diarrhea and Other (See Comments)    Also, bleeding

## 2024-10-13 NOTE — Patient Instructions (Addendum)
 Mupirocin ointment: Apply a pea sized amount four times per day daily just to inside of each nostril using your pinkie finger for 14 days (apply to the nostril part, not the middle part), then pinch your nose for 10 seconds, then stop  Use nasal saline spray (ocean spray) 10 times per day -- two squirts each nostril, especially before bed. Can use up to 20 times per day  DO NOT blow your nose.

## 2024-10-20 ENCOUNTER — Ambulatory Visit: Admitting: Internal Medicine

## 2024-10-25 ENCOUNTER — Other Ambulatory Visit: Payer: Self-pay | Admitting: Physician Assistant

## 2024-10-25 DIAGNOSIS — I2510 Atherosclerotic heart disease of native coronary artery without angina pectoris: Secondary | ICD-10-CM

## 2024-10-25 DIAGNOSIS — E1169 Type 2 diabetes mellitus with other specified complication: Secondary | ICD-10-CM

## 2024-10-27 ENCOUNTER — Other Ambulatory Visit: Payer: Self-pay

## 2024-10-27 ENCOUNTER — Inpatient Hospital Stay

## 2024-10-27 ENCOUNTER — Inpatient Hospital Stay: Admitting: Family

## 2024-10-27 ENCOUNTER — Inpatient Hospital Stay: Attending: Family

## 2024-10-27 ENCOUNTER — Encounter: Payer: Self-pay | Admitting: Family

## 2024-10-27 VITALS — BP 142/73 | HR 74 | Temp 98.2°F | Resp 20 | Ht 63.0 in | Wt 254.0 lb

## 2024-10-27 DIAGNOSIS — D649 Anemia, unspecified: Secondary | ICD-10-CM | POA: Diagnosis not present

## 2024-10-27 DIAGNOSIS — K2210 Ulcer of esophagus without bleeding: Secondary | ICD-10-CM

## 2024-10-27 DIAGNOSIS — D509 Iron deficiency anemia, unspecified: Secondary | ICD-10-CM | POA: Diagnosis present

## 2024-10-27 DIAGNOSIS — K253 Acute gastric ulcer without hemorrhage or perforation: Secondary | ICD-10-CM

## 2024-10-27 DIAGNOSIS — D563 Thalassemia minor: Secondary | ICD-10-CM | POA: Insufficient documentation

## 2024-10-27 DIAGNOSIS — D500 Iron deficiency anemia secondary to blood loss (chronic): Secondary | ICD-10-CM | POA: Diagnosis not present

## 2024-10-27 LAB — FERRITIN: Ferritin: 76 ng/mL (ref 11–307)

## 2024-10-27 LAB — CMP (CANCER CENTER ONLY)
ALT: 10 U/L (ref 0–44)
AST: 16 U/L (ref 15–41)
Albumin: 4.4 g/dL (ref 3.5–5.0)
Alkaline Phosphatase: 89 U/L (ref 38–126)
Anion gap: 10 (ref 5–15)
BUN: 9 mg/dL (ref 8–23)
CO2: 31 mmol/L (ref 22–32)
Calcium: 9.4 mg/dL (ref 8.9–10.3)
Chloride: 99 mmol/L (ref 98–111)
Creatinine: 0.96 mg/dL (ref 0.44–1.00)
GFR, Estimated: >60 mL/min
Glucose, Bld: 145 mg/dL — ABNORMAL HIGH (ref 70–99)
Potassium: 4.3 mmol/L (ref 3.5–5.1)
Sodium: 140 mmol/L (ref 135–145)
Total Bilirubin: 0.4 mg/dL (ref 0.0–1.2)
Total Protein: 7.9 g/dL (ref 6.5–8.1)

## 2024-10-27 LAB — CBC WITH DIFFERENTIAL (CANCER CENTER ONLY)
Abs Immature Granulocytes: 0.06 K/uL (ref 0.00–0.07)
Basophils Absolute: 0.1 K/uL (ref 0.0–0.1)
Basophils Relative: 1 %
Eosinophils Absolute: 0.2 K/uL (ref 0.0–0.5)
Eosinophils Relative: 3 %
HCT: 28.4 % — ABNORMAL LOW (ref 36.0–46.0)
Hemoglobin: 8.3 g/dL — ABNORMAL LOW (ref 12.0–15.0)
Immature Granulocytes: 1 %
Lymphocytes Relative: 12 %
Lymphs Abs: 0.9 K/uL (ref 0.7–4.0)
MCH: 20.6 pg — ABNORMAL LOW (ref 26.0–34.0)
MCHC: 29.2 g/dL — ABNORMAL LOW (ref 30.0–36.0)
MCV: 70.6 fL — ABNORMAL LOW (ref 80.0–100.0)
Monocytes Absolute: 0.6 K/uL (ref 0.1–1.0)
Monocytes Relative: 9 %
Neutro Abs: 5.5 K/uL (ref 1.7–7.7)
Neutrophils Relative %: 74 %
Platelet Count: 229 K/uL (ref 150–400)
RBC: 4.02 MIL/uL (ref 3.87–5.11)
RDW: 20.8 % — ABNORMAL HIGH (ref 11.5–15.5)
WBC Count: 7.4 K/uL (ref 4.0–10.5)
nRBC: 0 % (ref 0.0–0.2)

## 2024-10-27 LAB — SAMPLE TO BLOOD BANK

## 2024-10-27 LAB — IRON AND IRON BINDING CAPACITY (CC-WL,HP ONLY)
Iron: 52 ug/dL (ref 28–170)
Saturation Ratios: 14 % (ref 10.4–31.8)
TIBC: 358 ug/dL (ref 250–450)
UIBC: 307 ug/dL

## 2024-10-27 LAB — RETICULOCYTES
Immature Retic Fract: 29.6 % — ABNORMAL HIGH (ref 2.3–15.9)
RBC.: 4.05 MIL/uL (ref 3.87–5.11)
Retic Count, Absolute: 89.9 K/uL (ref 19.0–186.0)
Retic Ct Pct: 2.2 % (ref 0.4–3.1)

## 2024-10-27 MED ORDER — SODIUM CHLORIDE 0.9 % IV SOLN
510.0000 mg | Freq: Once | INTRAVENOUS | Status: AC
  Administered 2024-10-27: 510 mg via INTRAVENOUS
  Filled 2024-10-27: qty 510

## 2024-10-27 MED ORDER — SODIUM CHLORIDE 0.9 % IV SOLN: Freq: Once | INTRAVENOUS | Status: AC

## 2024-10-27 NOTE — Progress Notes (Signed)
 " Hematology and Oncology Follow Up Visit  Lynn [name] [id number] December 13, 1955 69 y.o. 10/27/2024   Principle Diagnosis:  Iron deficiency anemia  Beta thalassemia minor   Current Therapy:        IV iron as indicated  Folic acid  2 mg PO daily   Interim History:  Lynn [name] is here today for follow-up. She was too tired and did not make it in for her IV iron infusions last month. Iron saturation was down to 16% and ferritin 81 with Hgb 9.5 post transfusion.  She has a Hgb of 8.3 today. She denies noting any GI blood loss but does have daily nose bleeds secondary to dry nose with supplemental O2.  SOB and dizziness present. Occasional palpitations.  No fever, chills, n/v, cough, rash, chest pain, abdominal pain or changes in bowel or bladder habits.  No swelling or tingling in her extremities at this time.  No falls or syncope reported.  Appetite and hydration are good. Weight is 254 lbs.   ECOG Performance Status: 1 - Symptomatic but completely ambulatory  Medications:  Allergies as of 10/27/2024       Reactions   Methotrexate Other (See Comments)   Pneumonitis   Penicillins Hives   Childhood allergy   Farxiga  [dapagliflozin ] Other (See Comments)   Dizzy and lethargic   Metformin  And Related Diarrhea, Other (See Comments)   Also, bleeding        Medication List        Accurate as of October 27, 2024  1:55 PM. If you have any questions, ask your nurse or doctor.          acetaminophen  500 MG tablet Commonly known as: TYLENOL  Take 2,000 mg by mouth daily at 12 noon.   albuterol  (2.5 MG/3ML) 0.083% nebulizer solution Commonly known as: PROVENTIL  INHALE 3 ML BY NEBULIZATION EVERY 6 HOURS AS NEEDED FOR WHEEZING OR SHORTNESS OF BREATH What changed: See the new instructions.   albuterol  108 (90 Base) MCG/ACT inhaler Commonly known as: VENTOLIN  HFA TAKE 2 PUFFS BY MOUTH EVERY 6 HOURS AS NEEDED FOR WHEEZE OR SHORTNESS OF BREATH What changed: Another  medication with the same name was changed. Make sure you understand how and when to take each.   ALPRAZolam  0.25 MG tablet Commonly known as: XANAX  TAKE 1-2 TABLETS BY MOUTH 2 TIMES DAILY AS NEEDED FOR ANXIETY OR INSOMNIA   apixaban  5 MG Tabs tablet Commonly known as: ELIQUIS  Take 1 tablet (5 mg total) by mouth 2 (two) times daily.   atorvastatin  80 MG tablet Commonly known as: LIPITOR  TAKE 1 TABLET BY MOUTH EVERY EVENING   cetirizine 10 MG tablet Commonly known as: ZYRTEC Take 10 mg by mouth daily as needed for allergies.   Contour Next Test test strip Generic drug: glucose blood 3x daily   cyanocobalamin  1000 MCG tablet Commonly known as: VITAMIN B12 Take 1 tablet (1,000 mcg total) by mouth daily.   Dexcom G7 Sensor Misc 1 Device by Does not apply route as directed.   diltiazem  360 MG 24 hr capsule Commonly known as: CARDIZEM  CD Take 1 capsule (360 mg total) by mouth daily.   diphenhydramine -acetaminophen  25-500 MG Tabs tablet Commonly known as: TYLENOL  PM Take 2 tablets by mouth at bedtime.   ferrous sulfate  325 (65 FE) MG EC tablet Take 1 tablet (325 mg total) by mouth daily with breakfast.   folic acid  800 MCG tablet Commonly known as: FOLVITE  Take 800 mcg by mouth every other day.  furosemide  40 MG tablet Commonly known as: LASIX  Take 80 mg by mouth in the morning and an additional 40 mg for an overnight weight gain of 3 pounds or 5 pounds in a week   gabapentin  100 MG capsule Commonly known as: NEURONTIN  Take 2 capsules (200 mg total) by mouth at bedtime.   guaiFENesin  100 MG/5ML liquid Commonly known as: ROBITUSSIN Take 5 mLs by mouth every 4 (four) hours as needed for cough or to loosen phlegm.   HM MULTIVITAMIN ADULT GUMMY PO Take 2 each by mouth daily.   hydroxychloroquine  200 MG tablet Commonly known as: PLAQUENIL  Take 200 mg by mouth at bedtime.   Incontinence Supply Disposable Misc Please dispense Because Overnight Underwear Xl    insulin  lispro 100 UNIT/ML injection Commonly known as: HUMALOG  Max Daily 70 units per pump   ipratropium-albuterol  0.5-2.5 (3) MG/3ML Soln Commonly known as: DUONEB Take 3 mLs by nebulization 2 (two) times daily.   isosorbide  mononitrate 30 MG 24 hr tablet Commonly known as: IMDUR  Take 1 tablet (30 mg total) by mouth daily.   Lancets 30G Misc 1 Device by Does not apply route 3 (three) times daily.   losartan  100 MG tablet Commonly known as: COZAAR  Take 0.5 tablets (50 mg total) by mouth daily.   melatonin 5 MG Tabs Take 20 mg by mouth at bedtime.   mupirocin  ointment 2 % Commonly known as: BACTROBAN  Apply 1 Application topically 4 (four) times daily for 14 days.   nitroGLYCERIN  0.4 MG SL tablet Commonly known as: NITROSTAT  Place 1 tablet (0.4 mg total) under the tongue every 5 (five) minutes as needed for chest pain.   Omnipod 5 G7 Pods (Gen 5) Misc 1 Device by Other route every other day.   OXYGEN  Inhale 3 L/min into the lungs continuous.   oxymetazoline  0.05 % nasal spray Commonly known as: Afrin Nasal Spray Place 1 spray into both nostrils 2 (two) times daily as needed for congestion (Nosebleed).   pantoprazole  40 MG tablet Commonly known as: PROTONIX  Take 1 tablet (40 mg total) by mouth daily before breakfast. What changed:  when to take this reasons to take this   sodium chloride  0.65 % Soln nasal spray Commonly known as: OCEAN Place 2 sprays into both nostrils every 2 (two) hours as needed for congestion.   spironolactone  25 MG tablet Commonly known as: ALDACTONE  Take 1 tablet (25 mg total) by mouth daily.   Mounjaro  12.5 MG/0.5ML Pen Generic drug: tirzepatide  Inject 12.5 mg into the skin once a week.   tirzepatide  15 MG/0.5ML Pen Commonly known as: MOUNJARO  Inject 15 mg into the skin once a week.   traZODone  50 MG tablet Commonly known as: DESYREL  Take 0.5-1 tablets (25-50 mg total) by mouth at bedtime as needed for sleep. Needs appt What  changed: how much to take   VICKS SINEX NA Place 1 spray into both nostrils every 12 (twelve) hours as needed (for congestion- MAX OF THREE CONSECUTIVE DAYS).        Allergies: Allergies[1]  Past Medical History, Surgical history, Social history, and Family History were reviewed and updated.  Review of Systems: All other 10 point review of systems is negative.   Physical Exam:  vitals were not taken for this visit.   Wt Readings from Last 3 Encounters:  10/13/24 254 lb (115.2 kg)  09/24/24 256 lb (116.1 kg)  09/22/24 254 lb (115.2 kg)    Ocular: Sclerae unicteric, pupils equal, round and reactive to light Ear-nose-throat: Oropharynx clear,  dentition fair Lymphatic: No cervical or supraclavicular adenopathy Lungs no rales or rhonchi, good excursion bilaterally Heart regular rate and rhythm, no murmur appreciated Abd soft, nontender, positive bowel sounds MSK no focal spinal tenderness, no joint edema Neuro: non-focal, well-oriented, appropriate affect Breasts: Deferred   Lab Results  Component Value Date   WBC 7.0 09/23/2024   HGB 9.5 (L) 09/23/2024   HCT 31.7 (L) 09/23/2024   MCV 72.0 (L) 09/23/2024   PLT 269 09/23/2024   Lab Results  Component Value Date   FERRITIN 81 09/23/2024   IRON 61 09/23/2024   TIBC 386 09/23/2024   UIBC 325 09/23/2024   IRONPCTSAT 16 09/23/2024   Lab Results  Component Value Date   RETICCTPCT 1.8 09/23/2024   RBC 4.37 09/23/2024   RBC 4.40 09/23/2024   Lab Results  Component Value Date   KAPLAMBRATIO 4.04 03/31/2021   No results found for: KIMBERLY LE, [facility] Lab Results  Component Value Date   TOTALPROTELP 7.5 03/30/2021   ALBUMINELP 3.3 03/30/2021   A1GS 0.3 03/30/2021   A2GS 1.1 (H) 03/30/2021   BETS 0.9 03/30/2021   GAMS 1.9 (H) 03/30/2021   MSPIKE 0.8 (H) 03/30/2021   SPEI Comment 03/30/2021     Chemistry      Component Value Date/Time   NA 137 09/23/2024 1313   NA 137 08/14/2024 1629   K 4.3  09/23/2024 1313   CL 97 (L) 09/23/2024 1313   CO2 31 09/23/2024 1313   BUN 13 09/23/2024 1313   BUN 14 08/14/2024 1629   CREATININE 1.11 (H) 09/23/2024 1313   CREATININE 1.06 (H) 07/12/2023 1452   GLU 119 09/04/2022 0000      Component Value Date/Time   CALCIUM  9.5 09/23/2024 1313   ALKPHOS 75 09/23/2024 1313   AST 20 09/23/2024 1313   ALT 10 09/23/2024 1313   BILITOT 0.4 09/23/2024 1313       Impression and Plan:  Lynn [name] is a very pleasant 69 yo African American female with long history of microcytic anemia and beta thalassemia minor.  She is slowly recuperating from her recent bout with pneumonia and anemia.  We will hold off on transfusing her today. Hgb 8.3.  We will give her Feraheme  today and again next week.  Continue daily folic acid .  Follow-up in 1 month.  Lynn Pepper, NP 1/13/20261:55 PM     [1]  Allergies Allergen Reactions   Methotrexate Other (See Comments)    Pneumonitis   Penicillins Hives    Childhood allergy    Farxiga  Cornelius.coots ] Other (See Comments)    Dizzy and lethargic    Metformin  And Related Diarrhea and Other (See Comments)    Also, bleeding   "

## 2024-10-27 NOTE — Progress Notes (Unsigned)
 Okay to give Feraheme  today per Wells Pouch, Database Administrator.

## 2024-10-27 NOTE — Patient Instructions (Signed)

## 2024-10-29 ENCOUNTER — Other Ambulatory Visit: Payer: Self-pay | Admitting: Physician Assistant

## 2024-11-01 DIAGNOSIS — R55 Syncope and collapse: Secondary | ICD-10-CM

## 2024-11-02 ENCOUNTER — Ambulatory Visit: Payer: Self-pay | Admitting: Student

## 2024-11-03 ENCOUNTER — Inpatient Hospital Stay

## 2024-11-03 VITALS — BP 130/63 | HR 86 | Temp 97.5°F | Resp 18

## 2024-11-03 DIAGNOSIS — D509 Iron deficiency anemia, unspecified: Secondary | ICD-10-CM | POA: Diagnosis not present

## 2024-11-03 DIAGNOSIS — D500 Iron deficiency anemia secondary to blood loss (chronic): Secondary | ICD-10-CM

## 2024-11-03 DIAGNOSIS — K2210 Ulcer of esophagus without bleeding: Secondary | ICD-10-CM

## 2024-11-03 DIAGNOSIS — K253 Acute gastric ulcer without hemorrhage or perforation: Secondary | ICD-10-CM

## 2024-11-03 MED ORDER — SODIUM CHLORIDE 0.9 % IV SOLN
510.0000 mg | Freq: Once | INTRAVENOUS | Status: AC
  Administered 2024-11-03: 510 mg via INTRAVENOUS
  Filled 2024-11-03: qty 510

## 2024-11-03 MED ORDER — SODIUM CHLORIDE 0.9 % IV SOLN: Freq: Once | INTRAVENOUS | Status: AC

## 2024-11-03 NOTE — Patient Instructions (Addendum)
 Iron Deficiency Anemia, Adult  Iron deficiency anemia is when you do not have enough red blood cells or hemoglobin in your blood. This happens because you have too little iron in your body. Hemoglobin carries oxygen  to parts of the body. Anemia can cause your body to not get enough oxygen . What are the causes? Not eating enough foods that have iron in them. The body not being able to take in iron well. Blood loss. What increases the risk? Having menstrual periods. Being pregnant. What are the signs or symptoms? Pale skin, lips, and nails. Weakness, dizziness, and getting tired easily. Feeling like you cannot breathe well when moving (shortness of breath). Cold hands and feet. Mild anemia may not cause any symptoms. How is this treated? This condition is treated by finding out why you do not have enough iron and then getting more iron. It may include: Adding foods to your diet that have a lot of iron. Taking iron pills (supplements). If you are pregnant or breastfeeding, you may need to take extra iron. Your diet often does not provide the amount of iron that you need. Getting more vitamin C in your diet. Vitamin C helps your body take in iron. You may need to take iron pills with a glass of orange juice or vitamin C pills. Medicines to make heavy menstrual periods lighter. Surgery or testing procedures to find what is causing the condition. You may need blood tests to see if treatment is working. If the treatment does not seem to be working, you may need more tests. Follow these instructions at home: Medicines Take over-the-counter and prescription medicines only as told by your doctor. This includes iron pills and vitamins. Taking them as told is important because too much iron can be harmful. Take iron pills when your stomach is empty. If you cannot handle this, take them with food. Do not drink milk or take antacids at the same time as your iron pills. Iron pills may turn your poop  (stool)black. If you cannot handle taking iron pills by mouth, ask your doctor about getting iron through: An IV tube. A shot (injection) into a muscle. Eating and drinking Talk with your doctor before changing the foods you eat. Your doctor may tell you to eat foods that have a lot of iron, such as: Liver. Low-fat (lean) beef. Breads and cereals that have iron added to them. Eggs. Dried fruit. Dark green, leafy vegetables. Eat fresh fruits and vegetables that are high in vitamin C. They help your body use iron. Foods with a lot of vitamin C include: Oranges. Peppers. Tomatoes. Mangoes. Managing constipation If you are taking iron pills, they may cause trouble pooping (constipation). To prevent or treat this, you may need to: Drink enough fluid to keep your pee (urine) pale yellow. Take over-the-counter or prescription medicines. Eat foods that are high in fiber. These include beans, whole grains, and fresh fruits and vegetables. Limit foods that are high in fat and sugar. These include fried or sweet foods. General instructions Return to your normal activities when your doctor says that it is safe. Keep all follow-up visits. Contact a doctor if: You feel like you may vomit (nauseous), or you vomit. You feel weak. You get light-headed when getting up from sitting or lying down. You are sweating for no reason. You have trouble pooping. You have worse breathing with physical activity. You have heaviness in your chest. Get help right away if: You faint. If this happens, do not drive yourself  to the hospital. You have a fast heartbeat, or a heartbeat that does not feel regular. Summary Iron deficiency anemia happens when you have too little iron in your body. This condition is treated by finding out why you do not have enough iron in your body and then getting more iron. Take over-the-counter and prescription medicines only as told by your doctor. Eat fresh fruits and vegetables  that are high in vitamin C. Contact a doctor if you have trouble pooping or feel weak. This information is not intended to replace advice given to you by your health care provider. Make sure you discuss any questions you have with your health care provider. Document Revised: 11/09/2021 Document Reviewed: 11/09/2021 Elsevier Patient Education  2024 [employer].Ferumoxytol  Injection What is this medication? FERUMOXYTOL  (FER ue MOX i tol) treats low levels of iron in your body (iron deficiency anemia). Iron is a mineral that plays an important role in making red blood cells, which carry oxygen  from your lungs to the rest of your body. This medicine may be used for other purposes; ask your health care provider or pharmacist if you have questions. COMMON BRAND NAME(S): Feraheme  What should I tell my care team before I take this medication? They need to know if you have any of these conditions: Anemia not caused by low iron levels High levels of iron in the blood Magnetic resonance imaging (MRI) test scheduled An unusual or allergic reaction to iron, other medications, foods, dyes, or preservatives Pregnant or trying to get pregnant Breastfeeding How should I use this medication? This medication is injected into a vein. It is given by your care team in a hospital or clinic setting. Talk to your care team the use of this medication in children. Special care may be needed. Overdosage: If you think you have taken too much of this medicine contact a poison control center or emergency room at once. NOTE: This medicine is only for you. Do not share this medicine with others. What if I miss a dose? It is important not to miss your dose. Call your care team if you are unable to keep an appointment. What may interact with this medication? Other iron products This list may not describe all possible interactions. Give your health care provider a list of all the medicines, herbs, non-prescription drugs, or  dietary supplements you use. Also tell them if you smoke, drink alcohol, or use illegal drugs. Some items may interact with your medicine. What should I watch for while using this medication? Visit your care team for regular checks on your progress. Tell your care team if your symptoms do not start to get better or if they get worse. You may need blood work done while you are taking this medication. You may need to eat more foods that contain iron. Talk to your care team. Foods that contain iron include whole grains or cereals, dried fruits, beans, peas, leafy green vegetables, and organ meats (liver, kidney). What side effects may I notice from receiving this medication? Side effects that you should report to your care team as soon as possible: Allergic reactions--skin rash, itching, hives, swelling of the face, lips, tongue, or throat Low blood pressure--dizziness, feeling faint or lightheaded, blurry vision Shortness of breath Side effects that usually do not require medical attention (report to your care team if they continue or are bothersome): Flushing Headache Joint pain Muscle pain Nausea Pain, redness, or irritation at injection site This list may not describe all possible side effects.  Call your doctor for medical advice about side effects. You may report side effects to FDA at 1-800-FDA-1088. Where should I keep my medication? This medication is given in a hospital or clinic. It will not be stored at home. NOTE: This sheet is a summary. It may not cover all possible information. If you have questions about this medicine, talk to your doctor, pharmacist, or health care provider.  2024 Elsevier/Gold Standard (2023-05-22 00:00:00)

## 2024-11-04 ENCOUNTER — Encounter: Payer: Self-pay | Admitting: Family Medicine

## 2024-11-05 ENCOUNTER — Ambulatory Visit
Admission: RE | Admit: 2024-11-05 | Discharge: 2024-11-05 | Disposition: A | Discharge status: Home / Self Care | Source: Ambulatory Visit | Attending: Nurse Practitioner | Admitting: Nurse Practitioner

## 2024-11-05 DIAGNOSIS — R911 Solitary pulmonary nodule: Secondary | ICD-10-CM

## 2024-11-06 ENCOUNTER — Other Ambulatory Visit: Payer: Self-pay | Admitting: Physician Assistant

## 2024-11-10 ENCOUNTER — Ambulatory Visit: Payer: Self-pay | Admitting: Nurse Practitioner

## 2024-11-10 NOTE — Progress Notes (Signed)
 Can review CT results at f/u with Dr. Geronimo in 2 weeks. Thanks!

## 2024-11-24 ENCOUNTER — Ambulatory Visit (INDEPENDENT_AMBULATORY_CARE_PROVIDER_SITE_OTHER): Admitting: Otolaryngology

## 2024-11-25 ENCOUNTER — Encounter

## 2024-11-30 ENCOUNTER — Ambulatory Visit: Admitting: Internal Medicine

## 2024-12-31 ENCOUNTER — Ambulatory Visit: Admitting: Student

## 2025-01-22 ENCOUNTER — Ambulatory Visit: Admitting: Internal Medicine

## 2025-06-29 ENCOUNTER — Ambulatory Visit
